# Patient Record
Sex: Female | Born: 1955
Health system: Southern US, Community
[De-identification: ages and names within clinical notes are randomized; demographics above are authoritative.]

## PROBLEM LIST (undated history)

## (undated) DIAGNOSIS — K7460 Unspecified cirrhosis of liver: Secondary | ICD-10-CM

## (undated) DIAGNOSIS — I864 Gastric varices: Secondary | ICD-10-CM

## (undated) DIAGNOSIS — J189 Pneumonia, unspecified organism: Secondary | ICD-10-CM

## (undated) DIAGNOSIS — I959 Hypotension, unspecified: Secondary | ICD-10-CM

## (undated) DIAGNOSIS — I10 Essential (primary) hypertension: Secondary | ICD-10-CM

## (undated) DIAGNOSIS — Z860100 Personal history of colon polyps, unspecified: Secondary | ICD-10-CM

## (undated) DIAGNOSIS — R011 Cardiac murmur, unspecified: Secondary | ICD-10-CM

## (undated) DIAGNOSIS — M2550 Pain in unspecified joint: Secondary | ICD-10-CM

## (undated) DIAGNOSIS — E785 Hyperlipidemia, unspecified: Secondary | ICD-10-CM

## (undated) DIAGNOSIS — E119 Type 2 diabetes mellitus without complications: Secondary | ICD-10-CM

## (undated) DIAGNOSIS — G4730 Sleep apnea, unspecified: Secondary | ICD-10-CM

## (undated) DIAGNOSIS — I509 Heart failure, unspecified: Secondary | ICD-10-CM

## (undated) DIAGNOSIS — Z9289 Personal history of other medical treatment: Secondary | ICD-10-CM

## (undated) DIAGNOSIS — R112 Nausea with vomiting, unspecified: Secondary | ICD-10-CM

## (undated) DIAGNOSIS — D649 Anemia, unspecified: Secondary | ICD-10-CM

## (undated) DIAGNOSIS — D696 Thrombocytopenia, unspecified: Secondary | ICD-10-CM

## (undated) DIAGNOSIS — M797 Fibromyalgia: Secondary | ICD-10-CM

## (undated) DIAGNOSIS — R161 Splenomegaly, not elsewhere classified: Secondary | ICD-10-CM

## (undated) DIAGNOSIS — K7581 Nonalcoholic steatohepatitis (NASH): Secondary | ICD-10-CM

## (undated) DIAGNOSIS — M7989 Other specified soft tissue disorders: Secondary | ICD-10-CM

## (undated) DIAGNOSIS — K7469 Other cirrhosis of liver: Secondary | ICD-10-CM

## (undated) DIAGNOSIS — Z9889 Other specified postprocedural states: Secondary | ICD-10-CM

## (undated) DIAGNOSIS — I4819 Other persistent atrial fibrillation: Secondary | ICD-10-CM

## (undated) DIAGNOSIS — K766 Portal hypertension: Secondary | ICD-10-CM

## (undated) DIAGNOSIS — I499 Cardiac arrhythmia, unspecified: Secondary | ICD-10-CM

## (undated) DIAGNOSIS — R0600 Dyspnea, unspecified: Secondary | ICD-10-CM

## (undated) DIAGNOSIS — M1990 Unspecified osteoarthritis, unspecified site: Secondary | ICD-10-CM

## (undated) HISTORY — DX: Type 2 diabetes mellitus without complications: E11.9

## (undated) HISTORY — PX: KNEE ARTHROSCOPY: SHX127

## (undated) HISTORY — DX: Personal history of colon polyps, unspecified: Z86.0100

## (undated) HISTORY — DX: Essential (primary) hypertension: I10

## (undated) HISTORY — DX: Fibromyalgia: M79.7

## (undated) HISTORY — PX: CHOLECYSTECTOMY: SHX55

## (undated) HISTORY — DX: Personal history of colonic polyps: Z86.010

## (undated) HISTORY — DX: Sleep apnea, unspecified: G47.30

## (undated) HISTORY — DX: Hyperlipidemia, unspecified: E78.5

## (undated) HISTORY — PX: COLONOSCOPY: SHX174

---

## 2001-10-14 ENCOUNTER — Ambulatory Visit (HOSPITAL_BASED_OUTPATIENT_CLINIC_OR_DEPARTMENT_OTHER): Admission: RE | Admit: 2001-10-14 | Discharge: 2001-10-14 | Payer: Self-pay | Admitting: Emergency Medicine

## 2002-01-30 ENCOUNTER — Ambulatory Visit (HOSPITAL_BASED_OUTPATIENT_CLINIC_OR_DEPARTMENT_OTHER): Admission: RE | Admit: 2002-01-30 | Discharge: 2002-01-30 | Payer: Self-pay | Admitting: Internal Medicine

## 2003-12-09 ENCOUNTER — Ambulatory Visit (HOSPITAL_COMMUNITY): Admission: RE | Admit: 2003-12-09 | Discharge: 2003-12-09 | Payer: Self-pay | Admitting: Orthopedic Surgery

## 2003-12-09 ENCOUNTER — Ambulatory Visit (HOSPITAL_BASED_OUTPATIENT_CLINIC_OR_DEPARTMENT_OTHER): Admission: RE | Admit: 2003-12-09 | Discharge: 2003-12-09 | Payer: Self-pay | Admitting: Orthopedic Surgery

## 2006-04-06 ENCOUNTER — Encounter: Admission: RE | Admit: 2006-04-06 | Discharge: 2006-04-06 | Payer: Self-pay | Admitting: Sports Medicine

## 2006-05-01 ENCOUNTER — Ambulatory Visit (HOSPITAL_BASED_OUTPATIENT_CLINIC_OR_DEPARTMENT_OTHER): Admission: RE | Admit: 2006-05-01 | Discharge: 2006-05-01 | Payer: Self-pay | Admitting: Orthopedic Surgery

## 2006-05-29 ENCOUNTER — Encounter: Admission: RE | Admit: 2006-05-29 | Discharge: 2006-08-27 | Payer: Self-pay | Admitting: Orthopedic Surgery

## 2007-06-12 ENCOUNTER — Ambulatory Visit (HOSPITAL_BASED_OUTPATIENT_CLINIC_OR_DEPARTMENT_OTHER): Admission: RE | Admit: 2007-06-12 | Discharge: 2007-06-12 | Payer: Self-pay | Admitting: Emergency Medicine

## 2007-06-17 ENCOUNTER — Ambulatory Visit: Payer: Self-pay | Admitting: Internal Medicine

## 2007-07-31 ENCOUNTER — Ambulatory Visit: Payer: Self-pay | Admitting: Internal Medicine

## 2007-08-07 DIAGNOSIS — J309 Allergic rhinitis, unspecified: Secondary | ICD-10-CM | POA: Insufficient documentation

## 2007-08-07 DIAGNOSIS — G4733 Obstructive sleep apnea (adult) (pediatric): Secondary | ICD-10-CM | POA: Insufficient documentation

## 2007-08-07 DIAGNOSIS — I10 Essential (primary) hypertension: Secondary | ICD-10-CM | POA: Insufficient documentation

## 2007-11-08 ENCOUNTER — Encounter: Admission: RE | Admit: 2007-11-08 | Discharge: 2007-11-08 | Payer: Self-pay | Admitting: Family Medicine

## 2007-12-12 ENCOUNTER — Telehealth: Payer: Self-pay | Admitting: Internal Medicine

## 2007-12-25 ENCOUNTER — Ambulatory Visit: Payer: Self-pay | Admitting: Internal Medicine

## 2008-03-06 ENCOUNTER — Other Ambulatory Visit: Admission: RE | Admit: 2008-03-06 | Discharge: 2008-03-06 | Payer: Self-pay | Admitting: Obstetrics and Gynecology

## 2008-12-01 ENCOUNTER — Ambulatory Visit: Payer: Self-pay | Admitting: Oncology

## 2008-12-10 ENCOUNTER — Encounter: Payer: Self-pay | Admitting: Internal Medicine

## 2008-12-10 LAB — CBC WITH DIFFERENTIAL/PLATELET
BASO%: 0.5 % (ref 0.0–2.0)
Eosinophils Absolute: 0.1 10*3/uL (ref 0.0–0.5)
HCT: 39.2 % (ref 34.8–46.6)
LYMPH%: 31.2 % (ref 14.0–49.7)
MCHC: 34.8 g/dL (ref 31.5–36.0)
MCV: 86.6 fL (ref 79.5–101.0)
MONO#: 0.3 10*3/uL (ref 0.1–0.9)
MONO%: 6.1 % (ref 0.0–14.0)
NEUT%: 61.2 % (ref 38.4–76.8)
Platelets: 101 10*3/uL — ABNORMAL LOW (ref 145–400)
RBC: 4.52 10*6/uL (ref 3.70–5.45)
WBC: 5.5 10*3/uL (ref 3.9–10.3)

## 2008-12-11 LAB — COMPREHENSIVE METABOLIC PANEL
BUN: 20 mg/dL (ref 6–23)
CO2: 23 mEq/L (ref 19–32)
Calcium: 9.5 mg/dL (ref 8.4–10.5)
Chloride: 109 mEq/L (ref 96–112)
Creatinine, Ser: 0.8 mg/dL (ref 0.40–1.20)
Total Bilirubin: 0.5 mg/dL (ref 0.3–1.2)

## 2008-12-11 LAB — ANA: Anti Nuclear Antibody(ANA): NEGATIVE

## 2008-12-11 LAB — LACTATE DEHYDROGENASE: LDH: 149 U/L (ref 94–250)

## 2008-12-11 LAB — RHEUMATOID FACTOR: Rhuematoid fact SerPl-aCnc: 410 IU/mL — ABNORMAL HIGH (ref 0–20)

## 2008-12-23 ENCOUNTER — Ambulatory Visit: Payer: Self-pay | Admitting: Internal Medicine

## 2008-12-23 DIAGNOSIS — D6949 Other primary thrombocytopenia: Secondary | ICD-10-CM

## 2009-01-15 ENCOUNTER — Encounter: Payer: Self-pay | Admitting: Internal Medicine

## 2009-01-29 ENCOUNTER — Ambulatory Visit (HOSPITAL_BASED_OUTPATIENT_CLINIC_OR_DEPARTMENT_OTHER): Admission: RE | Admit: 2009-01-29 | Discharge: 2009-01-29 | Payer: Self-pay | Admitting: Orthopedic Surgery

## 2009-03-18 ENCOUNTER — Encounter: Admission: RE | Admit: 2009-03-18 | Discharge: 2009-03-18 | Payer: Self-pay | Admitting: Orthopedic Surgery

## 2009-03-19 ENCOUNTER — Encounter: Admission: RE | Admit: 2009-03-19 | Discharge: 2009-04-09 | Payer: Self-pay | Admitting: Orthopedic Surgery

## 2009-04-10 ENCOUNTER — Ambulatory Visit: Payer: Self-pay | Admitting: Oncology

## 2009-04-15 ENCOUNTER — Encounter: Payer: Self-pay | Admitting: Internal Medicine

## 2009-04-15 LAB — CBC WITH DIFFERENTIAL/PLATELET
BASO%: 0.5 % (ref 0.0–2.0)
EOS%: 1.5 % (ref 0.0–7.0)
Eosinophils Absolute: 0.1 10*3/uL (ref 0.0–0.5)
LYMPH%: 26.8 % (ref 14.0–49.7)
MCH: 30.4 pg (ref 25.1–34.0)
MCHC: 35 g/dL (ref 31.5–36.0)
MCV: 86.9 fL (ref 79.5–101.0)
MONO%: 9.5 % (ref 0.0–14.0)
Platelets: 96 10*3/uL — ABNORMAL LOW (ref 145–400)
RBC: 4.37 10*6/uL (ref 3.70–5.45)

## 2009-07-28 ENCOUNTER — Ambulatory Visit: Payer: Self-pay | Admitting: Oncology

## 2009-07-30 ENCOUNTER — Encounter: Payer: Self-pay | Admitting: Internal Medicine

## 2009-07-30 LAB — CBC WITH DIFFERENTIAL/PLATELET
BASO%: 0.3 % (ref 0.0–2.0)
Basophils Absolute: 0 10*3/uL (ref 0.0–0.1)
Eosinophils Absolute: 0.1 10*3/uL (ref 0.0–0.5)
HCT: 38.2 % (ref 34.8–46.6)
LYMPH%: 23.9 % (ref 14.0–49.7)
MCHC: 34 g/dL (ref 31.5–36.0)
MONO#: 0.4 10*3/uL (ref 0.1–0.9)
NEUT%: 66.9 % (ref 38.4–76.8)
Platelets: 103 10*3/uL — ABNORMAL LOW (ref 145–400)
WBC: 4.8 10*3/uL (ref 3.9–10.3)

## 2009-07-30 LAB — COMPREHENSIVE METABOLIC PANEL
ALT: 13 U/L (ref 0–35)
Albumin: 4.2 g/dL (ref 3.5–5.2)
BUN: 20 mg/dL (ref 6–23)
CO2: 25 mEq/L (ref 19–32)
Calcium: 8.9 mg/dL (ref 8.4–10.5)
Creatinine, Ser: 0.83 mg/dL (ref 0.40–1.20)
Glucose, Bld: 121 mg/dL — ABNORMAL HIGH (ref 70–99)
Total Bilirubin: 0.6 mg/dL (ref 0.3–1.2)
Total Protein: 6.9 g/dL (ref 6.0–8.3)

## 2009-09-19 HISTORY — PX: COLECTOMY: SHX59

## 2009-10-23 ENCOUNTER — Ambulatory Visit (HOSPITAL_COMMUNITY): Admission: RE | Admit: 2009-10-23 | Discharge: 2009-10-23 | Payer: Self-pay | Admitting: Gastroenterology

## 2009-12-30 ENCOUNTER — Ambulatory Visit: Payer: Self-pay | Admitting: Oncology

## 2010-01-01 ENCOUNTER — Encounter: Payer: Self-pay | Admitting: Internal Medicine

## 2010-01-01 LAB — CBC WITH DIFFERENTIAL/PLATELET
BASO%: 0.5 % (ref 0.0–2.0)
Basophils Absolute: 0 10*3/uL (ref 0.0–0.1)
EOS%: 1.4 % (ref 0.0–7.0)
Eosinophils Absolute: 0.1 10*3/uL (ref 0.0–0.5)
HCT: 37.7 % (ref 34.8–46.6)
MCHC: 34.6 g/dL (ref 31.5–36.0)
MONO%: 7 % (ref 0.0–14.0)
NEUT%: 63.6 % (ref 38.4–76.8)
lymph#: 1.4 10*3/uL (ref 0.9–3.3)

## 2010-01-01 LAB — COMPREHENSIVE METABOLIC PANEL
Alkaline Phosphatase: 56 U/L (ref 39–117)
BUN: 21 mg/dL (ref 6–23)
Calcium: 8.9 mg/dL (ref 8.4–10.5)
Chloride: 108 mEq/L (ref 96–112)
Creatinine, Ser: 0.77 mg/dL (ref 0.40–1.20)
Potassium: 3.9 mEq/L (ref 3.5–5.3)
Sodium: 140 mEq/L (ref 135–145)
Total Bilirubin: 0.6 mg/dL (ref 0.3–1.2)

## 2010-02-01 ENCOUNTER — Encounter (INDEPENDENT_AMBULATORY_CARE_PROVIDER_SITE_OTHER): Payer: Self-pay | Admitting: General Surgery

## 2010-02-01 ENCOUNTER — Inpatient Hospital Stay (HOSPITAL_COMMUNITY): Admission: RE | Admit: 2010-02-01 | Discharge: 2010-02-05 | Payer: Self-pay | Admitting: General Surgery

## 2010-02-09 ENCOUNTER — Ambulatory Visit: Payer: Self-pay | Admitting: Oncology

## 2010-02-10 LAB — COMPREHENSIVE METABOLIC PANEL
AST: 15 U/L (ref 0–37)
Alkaline Phosphatase: 55 U/L (ref 39–117)
BUN: 13 mg/dL (ref 6–23)
Chloride: 103 mEq/L (ref 96–112)
Creatinine, Ser: 0.78 mg/dL (ref 0.40–1.20)
Glucose, Bld: 114 mg/dL — ABNORMAL HIGH (ref 70–99)
Potassium: 4.5 mEq/L (ref 3.5–5.3)
Sodium: 140 mEq/L (ref 135–145)
Total Bilirubin: 0.7 mg/dL (ref 0.3–1.2)
Total Protein: 6.8 g/dL (ref 6.0–8.3)

## 2010-02-10 LAB — CBC WITH DIFFERENTIAL/PLATELET
BASO%: 0.4 % (ref 0.0–2.0)
EOS%: 1.6 % (ref 0.0–7.0)
Eosinophils Absolute: 0.1 10*3/uL (ref 0.0–0.5)
HGB: 11.6 g/dL (ref 11.6–15.9)
LYMPH%: 16.2 % (ref 14.0–49.7)
MONO#: 0.4 10*3/uL (ref 0.1–0.9)
NEUT%: 75.8 % (ref 38.4–76.8)
Platelets: 134 10*3/uL — ABNORMAL LOW (ref 145–400)
WBC: 6.3 10*3/uL (ref 3.9–10.3)

## 2010-03-10 ENCOUNTER — Encounter: Admission: RE | Admit: 2010-03-10 | Discharge: 2010-03-10 | Payer: Self-pay | Admitting: General Surgery

## 2010-03-15 ENCOUNTER — Ambulatory Visit (HOSPITAL_COMMUNITY): Admission: RE | Admit: 2010-03-15 | Discharge: 2010-03-16 | Payer: Self-pay | Admitting: General Surgery

## 2010-07-21 ENCOUNTER — Ambulatory Visit (HOSPITAL_BASED_OUTPATIENT_CLINIC_OR_DEPARTMENT_OTHER): Payer: BC Managed Care – PPO | Admitting: Oncology

## 2010-07-23 LAB — CBC WITH DIFFERENTIAL/PLATELET
Basophils Absolute: 0 10*3/uL (ref 0.0–0.1)
EOS%: 2 % (ref 0.0–7.0)
HGB: 13.3 g/dL (ref 11.6–15.9)
LYMPH%: 21.5 % (ref 14.0–49.7)
MONO#: 0.4 10*3/uL (ref 0.1–0.9)
MONO%: 7.4 % (ref 0.0–14.0)
NEUT#: 3.7 10*3/uL (ref 1.5–6.5)
NEUT%: 68.6 % (ref 38.4–76.8)
RBC: 4.45 10*6/uL (ref 3.70–5.45)

## 2010-07-23 LAB — COMPREHENSIVE METABOLIC PANEL
ALT: 40 U/L — ABNORMAL HIGH (ref 0–35)
AST: 44 U/L — ABNORMAL HIGH (ref 0–37)
Albumin: 4.4 g/dL (ref 3.5–5.2)
BUN: 14 mg/dL (ref 6–23)
Calcium: 9.1 mg/dL (ref 8.4–10.5)
Chloride: 106 mEq/L (ref 96–112)
Glucose, Bld: 96 mg/dL (ref 70–99)
Sodium: 139 mEq/L (ref 135–145)
Total Protein: 7 g/dL (ref 6.0–8.3)

## 2010-10-19 NOTE — Letter (Signed)
Summary: [facility]   Imported By: Phillis Knack 01/25/2010 10:40:21  _____________________________________________________________________  External Attachment:    Type:   Image     Comment:   External Document

## 2010-10-21 ENCOUNTER — Ambulatory Visit: Payer: BC Managed Care – PPO | Admitting: Oncology

## 2010-10-21 DIAGNOSIS — Z86010 Personal history of colonic polyps: Secondary | ICD-10-CM

## 2010-10-21 DIAGNOSIS — D696 Thrombocytopenia, unspecified: Secondary | ICD-10-CM

## 2010-10-21 LAB — CBC WITH DIFFERENTIAL/PLATELET
BASO%: 0.5 % (ref 0.0–2.0)
EOS%: 1.6 % (ref 0.0–7.0)
HGB: 13.5 g/dL (ref 11.6–15.9)
MCH: 29.6 pg (ref 25.1–34.0)
MCV: 88.6 fL (ref 79.5–101.0)
MONO%: 6 % (ref 0.0–14.0)
NEUT#: 3.7 10*3/uL (ref 1.5–6.5)
Platelets: 72 10*3/uL — ABNORMAL LOW (ref 145–400)
WBC: 5.7 10*3/uL (ref 3.9–10.3)
nRBC: 0 % (ref 0–0)

## 2010-12-05 LAB — CBC
Hemoglobin: 12.3 g/dL (ref 12.0–15.0)
MCHC: 35 g/dL (ref 30.0–36.0)
Platelets: 96 10*3/uL — ABNORMAL LOW (ref 150–400)

## 2010-12-05 LAB — DIFFERENTIAL
Basophils Absolute: 0 10*3/uL (ref 0.0–0.1)
Basophils Relative: 0 % (ref 0–1)
Eosinophils Absolute: 0.1 10*3/uL (ref 0.0–0.7)
Lymphocytes Relative: 19 % (ref 12–46)
Lymphs Abs: 1.1 10*3/uL (ref 0.7–4.0)
Monocytes Relative: 8 % (ref 3–12)
Neutro Abs: 4.1 10*3/uL (ref 1.7–7.7)
Neutrophils Relative %: 72 % (ref 43–77)

## 2010-12-05 LAB — BASIC METABOLIC PANEL
BUN: 11 mg/dL (ref 6–23)
Creatinine, Ser: 0.69 mg/dL (ref 0.4–1.2)
GFR calc non Af Amer: >60 mL/min (ref >60)

## 2010-12-06 LAB — GLUCOSE, CAPILLARY
Glucose-Capillary: 102 mg/dL — ABNORMAL HIGH (ref 70–99)
Glucose-Capillary: 103 mg/dL — ABNORMAL HIGH (ref 70–99)
Glucose-Capillary: 107 mg/dL — ABNORMAL HIGH (ref 70–99)
Glucose-Capillary: 112 mg/dL — ABNORMAL HIGH (ref 70–99)
Glucose-Capillary: 113 mg/dL — ABNORMAL HIGH (ref 70–99)
Glucose-Capillary: 114 mg/dL — ABNORMAL HIGH (ref 70–99)
Glucose-Capillary: 122 mg/dL — ABNORMAL HIGH (ref 70–99)
Glucose-Capillary: 126 mg/dL — ABNORMAL HIGH (ref 70–99)
Glucose-Capillary: 127 mg/dL — ABNORMAL HIGH (ref 70–99)
Glucose-Capillary: 130 mg/dL — ABNORMAL HIGH (ref 70–99)
Glucose-Capillary: 170 mg/dL — ABNORMAL HIGH (ref 70–99)
Glucose-Capillary: 90 mg/dL (ref 70–99)
Glucose-Capillary: 93 mg/dL (ref 70–99)

## 2010-12-06 LAB — BASIC METABOLIC PANEL
CO2: 24 mEq/L (ref 19–32)
Chloride: 110 mEq/L (ref 96–112)
Chloride: 116 mEq/L — ABNORMAL HIGH (ref 96–112)
GFR calc Af Amer: >60 mL/min (ref >60)
GFR calc Af Amer: >60 mL/min (ref >60)
Potassium: 3.8 mEq/L (ref 3.5–5.1)
Potassium: 4.4 mEq/L (ref 3.5–5.1)
Sodium: 139 mEq/L (ref 135–145)
Sodium: 141 mEq/L (ref 135–145)

## 2010-12-06 LAB — CBC
HCT: 30.8 % — ABNORMAL LOW (ref 36.0–46.0)
HCT: 31.4 % — ABNORMAL LOW (ref 36.0–46.0)
Hemoglobin: 10.4 g/dL — ABNORMAL LOW (ref 12.0–15.0)
Hemoglobin: 10.7 g/dL — ABNORMAL LOW (ref 12.0–15.0)
MCHC: 33.6 g/dL (ref 30.0–36.0)
MCHC: 33.9 g/dL (ref 30.0–36.0)
MCV: 89.3 fL (ref 78.0–100.0)
Platelets: 102 10*3/uL — ABNORMAL LOW (ref 150–400)
Platelets: 74 10*3/uL — ABNORMAL LOW (ref 150–400)
RBC: 3.45 MIL/uL — ABNORMAL LOW (ref 3.87–5.11)
RDW: 14.2 % (ref 11.5–15.5)
WBC: 5.3 10*3/uL (ref 4.0–10.5)
WBC: 7.5 10*3/uL (ref 4.0–10.5)
WBC: 9.5 10*3/uL (ref 4.0–10.5)

## 2010-12-07 LAB — CBC
Platelets: 96 10*3/uL — ABNORMAL LOW (ref 150–400)
RDW: 14.1 % (ref 11.5–15.5)
WBC: 5.6 10*3/uL (ref 4.0–10.5)

## 2010-12-07 LAB — COMPREHENSIVE METABOLIC PANEL
ALT: 18 U/L (ref 0–35)
AST: 16 U/L (ref 0–37)
Albumin: 3.7 g/dL (ref 3.5–5.2)
Alkaline Phosphatase: 63 U/L (ref 39–117)
Chloride: 109 mEq/L (ref 96–112)
GFR calc Af Amer: >60 mL/min (ref >60)
Potassium: 4.2 mEq/L (ref 3.5–5.1)
Sodium: 141 mEq/L (ref 135–145)
Total Bilirubin: 0.7 mg/dL (ref 0.3–1.2)
Total Protein: 7.2 g/dL (ref 6.0–8.3)

## 2010-12-07 LAB — DIFFERENTIAL
Basophils Absolute: 0 10*3/uL (ref 0.0–0.1)
Basophils Relative: 1 % (ref 0–1)
Eosinophils Absolute: 0.1 10*3/uL (ref 0.0–0.7)
Eosinophils Relative: 1 % (ref 0–5)
Monocytes Absolute: 0.4 10*3/uL (ref 0.1–1.0)
Monocytes Relative: 7 % (ref 3–12)
Neutro Abs: 3.8 10*3/uL (ref 1.7–7.7)

## 2010-12-07 LAB — APTT: aPTT: 33 seconds (ref 24–37)

## 2011-01-20 ENCOUNTER — Encounter (HOSPITAL_BASED_OUTPATIENT_CLINIC_OR_DEPARTMENT_OTHER): Payer: BC Managed Care – PPO | Admitting: Oncology

## 2011-01-20 ENCOUNTER — Other Ambulatory Visit: Payer: Self-pay | Admitting: Medical

## 2011-01-20 DIAGNOSIS — D6949 Other primary thrombocytopenia: Secondary | ICD-10-CM

## 2011-01-20 DIAGNOSIS — D696 Thrombocytopenia, unspecified: Secondary | ICD-10-CM

## 2011-01-20 DIAGNOSIS — Z86010 Personal history of colonic polyps: Secondary | ICD-10-CM

## 2011-01-20 LAB — COMPREHENSIVE METABOLIC PANEL
Albumin: 4.2 g/dL (ref 3.5–5.2)
Alkaline Phosphatase: 73 U/L (ref 39–117)
BUN: 13 mg/dL (ref 6–23)
Calcium: 9.2 mg/dL (ref 8.4–10.5)
Chloride: 106 mEq/L (ref 96–112)
Creatinine, Ser: 0.71 mg/dL (ref 0.40–1.20)
Glucose, Bld: 116 mg/dL — ABNORMAL HIGH (ref 70–99)
Potassium: 4.2 mEq/L (ref 3.5–5.3)

## 2011-01-20 LAB — CBC WITH DIFFERENTIAL/PLATELET
BASO%: 0.4 % (ref 0.0–2.0)
Basophils Absolute: 0 10*3/uL (ref 0.0–0.1)
EOS%: 1.8 % (ref 0.0–7.0)
Eosinophils Absolute: 0.1 10*3/uL (ref 0.0–0.5)
HCT: 36.9 % (ref 34.8–46.6)
HGB: 12.7 g/dL (ref 11.6–15.9)
LYMPH%: 22.7 % (ref 14.0–49.7)
MCH: 30.7 pg (ref 25.1–34.0)
MCHC: 34.3 g/dL (ref 31.5–36.0)
MCV: 89.4 fL (ref 79.5–101.0)
MONO#: 0.4 10*3/uL (ref 0.1–0.9)
MONO%: 8.2 % (ref 0.0–14.0)
NEUT#: 3.1 10*3/uL (ref 1.5–6.5)
NEUT%: 66.9 % (ref 38.4–76.8)
Platelets: 78 10*3/uL — ABNORMAL LOW (ref 145–400)
RBC: 4.13 10*6/uL (ref 3.70–5.45)
RDW: 14.8 % — ABNORMAL HIGH (ref 11.2–14.5)
WBC: 4.6 10*3/uL (ref 3.9–10.3)
lymph#: 1 10*3/uL (ref 0.9–3.3)

## 2011-02-01 NOTE — Op Note (Signed)
NAMEALAIJAH, Hannah Khan               ACCOUNT NO.:  [id number]   MEDICAL RECORD NO.:  [id number]          PATIENT TYPE:  AMB   LOCATION:  [place]                          FACILITY:  [facility]   PHYSICIAN:  Robert A. Noemi Khan, M.D. DATE OF BIRTH:  1956-04-08   DATE OF PROCEDURE:  01/29/2009  DATE OF DISCHARGE:                               OPERATIVE REPORT   PREOPERATIVE DIAGNOSES:  1. Right knee medial and lateral meniscal tears with chondromalacia      and synovitis.  2. Right knee loose body.   POSTOPERATIVE DIAGNOSES:  1. Right knee medial and lateral meniscal tears with chondromalacia      and synovitis.  2. Right knee loose body.   PROCEDURE:  1. Right knee examination under anesthesia, followed by arthroscopic      partial medial and lateral meniscectomies.  2. Right knee loose body excision.  3. Right knee chondroplasty with partial synovectomy.   SURGEON:  Hannah Camel. Noemi Chapel, MD   ANESTHESIA:  Local and MAC.   OPERATIVE TIME:  40 minutes.   COMPLICATIONS:  None.   INDICATIONS FOR PROCEDURE:  Ms. Hannah Khan is a 52-year woman who has had 3-  4 months of increasing right knee pain with exam and MRI documenting  meniscal tearing with chondromalacia, possible loose body, and  synovitis.  She has failed conservative care and is now to undergo  arthroscopy.   DESCRIPTION:  Ms. Hannah Khan is brought to the operating room on Jan 29, 2009, after a knee block was placed in holding room by Anesthesia.  She  was placed on the operative table in supine position.  Her right knee  was examined under anesthesia.  She had full range of motion.  Her knee  was stable, ligamentous exam with normal patellar tracking.  She  received Ancef 1 g IV preoperatively for prophylaxis.  Her right leg was  then prepped using sterile DuraPrep and draped using sterile technique.  Originally through an anterolateral portal, the arthroscope with a pump  attached was placed through an anteromedial portal and an  arthroscopic  probe was placed.  On initial inspection of medial compartment, she was  found to have 50-60% grade 3 chondromalacia which was debrided.  Medial  meniscus showed tear of the anterior horn and medial horn of which 25%  was resected back to a stable rim.  Intercondylar notch showed a large  loose body in the notch measuring 1.5 x 2 cm which was removed.  The ACL  and PCL were normal.  Lateral compartment showed grade 1 and 2  chondromalacia and 20% grade 3 changes which was debrided.  Lateral  meniscus showed tearing of the anterior and lateral horn 25-30% which  was resected back to a stable rim.  The rest of lateral meniscus was  intact.  Patellofemoral joint showed 75% grade 3 chondromalacia on the  patella and femoral groove and this was debrided.  The patella tracked  normally.  There was significant scarred synovitis in the medial and  lateral gutters which was debrided, otherwise, they are free of  pathology.  After this was  done, it felt that all pathology have been  satisfactorily addressed.  The instruments were removed.  The portals  were closed with 3-0 nylon suture.  Sterile dressings applied and the  patient awakened and taken to recovery in stable condition.   FOLLOWUP CARE:  Ms. Hannah Khan will be followed as an outpatient on Vicodin  and Mobic.  She will be seen back in the office in a week for sutures  out and followup.      Robert A. Noemi Khan, M.D.  Electronically Signed     Hannah Khan, M.D.  Electronically Signed    RAW/MEDQ  D:  01/29/2009  T:  01/30/2009  Job:  [id number]

## 2011-02-01 NOTE — Assessment & Plan Note (Signed)
[facility]                             PULMONARY OFFICE NOTE   NAME:JOHNSONSharaya, Hannah Khan                      MRN:          [id number]  DATE:07/31/2007                            DOB:          March 10, 1956    PROBLEM:  A 55 year old woman, previously followed at the old office,  coming now to establish as a new patient because of sleep apnea.   HISTORY:  In 2003, she was diagnosed with obstructive sleep apnea with  an apnea/hypopnea index of 45 per hour, moderate snoring, and  desaturation to 82%.  CPAP was titrated and then adjusted to 7 CWP which  she has been using ever since.  A new sleep study was done for update 2  months ago, and her CPAP pressure was adjusted up to 14.  She thinks she  is sleeping better now, but the pressure seems high.  When seen in 2003,  she had weighed 325 pounds, and she has gained weight since then.  She  had a recent sinusitis and head congestion which is beginning to  improve, but she is experiencing dry mouth.  Bedtime is around 11:30,  estimating sleep latency 10 minutes, waking 3 to 4 times during the  night before finally up at 6 a.m.  Home care is through [facility].   HOME MEDICATIONS:  1. Lisinopril/hydrochlorothiazide 10/12.5.  2. Robaxin 500 mg b.i.d.  3. Topamax 200 mg.  4. Metformin 500 mg.  5. Pataday eye drops.  6. Xyzal 5 mg daily.  7. Recent Levaquin for sinus infection.   Drug intolerance to CELEBREX and PREDNISONE.   REVIEW OF SYSTEMS:  Feels bloated, aware of snoring, legs crawl some  before she goes to sleep, daytime sleepiness, body jerks during sleep,  night cough, occasional vivid dreams.   PAST HISTORY:  1. Hypertension.  2. Borderline diabetes.  3. Elevated cholesterol.  4. Allergic rhinitis.  5. Sleep apnea.  6. No history of seasonal rhinitis.   She has not had any ENT surgery.  Surgeries have included gallbladder,  bilateral knee surgeries, C-section.   SOCIAL HISTORY:  Quit smoking 30  years ago.  Remarried.   FAMILY HISTORY:  Nobody known to have sleep apnea.   OBJECTIVE:  Weight 332 pounds, BP 102/58, pulse 95, room air saturation  98%.  She is overweight, alert.  Palate spacing 3 to 4/4 with clear speech.  Somewhat stuffy nose during conversation with minimal turbinate edema,  no visible postnasal drip.  LUNGS:  Clear.  Heart sounds regular without murmur.  There is no evident restlessness or tremor.   IMPRESSION:  1. Obstructive sleep apnea.  I need to see the report from her      September study, but she does feel that she needed a pressure      increase from her original 7 CWP.  I am not sure if the current      pressure is a little too high, especially if it contributing to her      nasal congestion and dryness.  She is using a cool mist humidifier.  I am not sure that can be adjusted.  2. Resolving sinusitis.  3. Exogenous obesity.   PLAN:  1. Nasal nebulizer treatment, Neo-Synephrine.  2. She will ask [facility] about her humidifier.  3. Nasal saline gel.  4. Finish Levaquin.  5. Schedule return 1 month, earlier p.r.n.  6. I gave information on saline nasal lavage.     Clinton D. Annamaria Boots, MD, [facility], [place]  Electronically Signed    CDY/MedQ  DD: 07/31/2007  DT: 08/01/2007  Job #: [id number]   cc:   Zella Richer. Burnett Harry, M.D.

## 2011-02-03 ENCOUNTER — Encounter: Payer: Self-pay | Admitting: Internal Medicine

## 2011-02-03 ENCOUNTER — Ambulatory Visit (INDEPENDENT_AMBULATORY_CARE_PROVIDER_SITE_OTHER): Payer: BC Managed Care – PPO | Admitting: Internal Medicine

## 2011-02-03 VITALS — BP 144/82 | HR 124 | Ht 67.0 in | Wt 338.0 lb

## 2011-02-03 DIAGNOSIS — J309 Allergic rhinitis, unspecified: Secondary | ICD-10-CM

## 2011-02-03 DIAGNOSIS — G4733 Obstructive sleep apnea (adult) (pediatric): Secondary | ICD-10-CM

## 2011-02-03 NOTE — Progress Notes (Signed)
  Subjective:    Patient ID: Hannah Khan, female    DOB: [birth year], 55 y.o.   MRN: [id number]  HPI 02/03/11- 60 yoF former smoker, followed for OSA, complicated by thrombocytopenia, HPB , allergic rhintis, DM.  Last here 12/24/07. Last year she had an ascending colectomy for polyp with abscess complication. She lost then gained weight with the surgery. She realizes with weight gain since that  she is snoring through her CPAP, sleep is less restful, fights some daytime sleepiness. CPAP pressure is at 7 through [facility] with machine that is now quite old. Spring pollen was notable but not really bad with stuffiness but no itching of eyes.    Review of Systems Constitutional:   No weight loss, night sweats,  Fevers, chills, fatigue, lassitude. HEENT:   No headaches,  Difficulty swallowing,  Tooth/dental problems,  Sore throat,                No sneezing, itching, ear ache, nasal congestion, post nasal drip,   CV:  No chest pain,  Orthopnea, PND, swelling in lower extremities, anasarca, dizziness, palpitations  GI  No heartburn, indigestion, abdominal pain, nausea, vomiting, diarrhea, change in bowel habits, loss of appetite  Resp: No shortness of breath with exertion or at rest.  No excess mucus, no productive cough,  No non-productive cough,  No coughing up of blood.  No change in color of mucus.  No wheezing.   Skin: no rash or lesions.  GU: no dysuria, change in color of urine, no urgency or frequency.  No flank pain.  MS:  No joint pain or swelling.  No decreased range of motion.  No back pain.  Psych:  No change in mood or affect. No depression or anxiety.  No memory loss.      Objective:   Physical Exam General- Alert, Oriented, Affect-appropriate, Distress- none acute   Very obese Skin- rash-none, lesions- none, excoriation- none  Lymphadenopathy- none  Head- atraumatic  Eyes- Gross vision intact, PERRLA, conjunctivae clear secretions  Ears- Hearing, canals, Tm -  normal  Nose- Clear,  No- Septal dev, mucus, polyps, erosion, perforation   Throat- Mallampati II , mucosa clear , drainage- none, tonsils- atrophic  Neck- flexible , trachea midline, no stridor , thyroid nl, carotid no bruit  Chest - symmetrical excursion , unlabored     Heart/CV- RRR , no murmur , no gallop  , no rub, nl s1 s2                     - JVD- none , edema- none, stasis changes- none, varices- none     Lung- clear to P&A, wheeze- none, cough- none , dullness-none, rub- none     Chest wall-  Abd- tender-no, distended-no, bowel sounds-present, HSM- none;       borborygmi  Br/ Gen/ Rectal- Not done, not indicated  Extrem- cyanosis- none, clubbing, none, atrophy- none, strength- nl  Neuro- grossly intact to observation         Assessment & Plan:

## 2011-02-03 NOTE — Assessment & Plan Note (Signed)
Mild seasonal rhinitis this year.

## 2011-02-03 NOTE — Patient Instructions (Signed)
Orders- [place]- 1) [facility]- autotitrate CPAP 5-15 cwp x 1 week for pressure recommendation                         2) Replacement CPAP machine - pressure to be determined after we get autotitration download

## 2011-02-03 NOTE — Assessment & Plan Note (Addendum)
Machine is old and by her description, pressure is insufficient. We will autotitrate and replace machine.  Weight loss would help.

## 2011-02-04 NOTE — Procedures (Signed)
NAME:  Hannah Khan, Hannah Khan               ACCOUNT NO.:  [id number]   MEDICAL RECORD NO.:  [id number]          PATIENT TYPE:  OUT   LOCATION:  [facility]                 FACILITY:  [facility]   PHYSICIAN:  Clinton D. Annamaria Boots, MD, FCCP, FACPDATE OF BIRTH:  04[birth year]   DATE OF STUDY:                            NOCTURNAL POLYSOMNOGRAM   REFERRING PHYSICIAN:  Zella Richer. Burnett Harry, M.D.   INDICATION FOR STUDY:  Hypersomnia with sleep apnea.   EPWORTH SLEEPINESS SCORE:  Ten/twenty-four, BMI 49, weight 325 pounds.   MEDICATIONS:  Home medications are listed and reviewed. A previous study  on 10/14/01, recorded an AHI of 45/hour. CPAP titration is now requested.   SLEEP ARCHITECTURE:  Total sleep time 319 minutes with sleep efficiency  81%. Stage 1 was 3%, stage 2 56%, stage 3 was 10%, REM 11% of total  sleep time. Sleep latency 29 minutes, REM latency 68 minutes. Awake  after sleep onset 45 minutes, arousal index 15.6. Topamax and  methocarbamol taken at 9:50 p.m.   RESPIRATORY DATA:  CPAP titration protocol. CPAP was titrated to 14 CWP,  AHI 0/hour. She chose a Respironics Comfort Gel nasal mask with heated  humidifier.   OXYGEN DATA:  Snoring prevented by CPAP with oxygen saturation held at  97% on room air.   CARDIAC DATA:  Sinus rhythm with occasional PA-C.   MOVEMENT-PARASOMNIA:  A total of 53 limb jerks were recorded of which 23  were associated with arousal or awakening for a periodic limb movement  with arousal index of 10 per hour which is increased.   IMPRESSIONS-RECOMMENDATIONS:  1. Successful continuous positive airway pressure titration to 14      centimeters of water pressure, apnea/hypopnea index 0 per hour. A      petite, Respironics Comfort Gel nasal mask was used with heated      humidifier.  2. Previous sleep study on 10/14/01, had recorded an apnea/hypopnea      index of 45 per hour.  3. Periodic limb movement with arousal, 10 per hour.     Clinton D. Annamaria Boots, MD, [facility], FACP  Diplomate, Tax adviser of Sleep Medicine  Electronically Signed    CDY/MEDQ  D:  06/17/2007 14:02:24  T:  06/17/2007 14:30:55  Job:  [id number]

## 2011-03-11 ENCOUNTER — Telehealth: Payer: Self-pay | Admitting: Internal Medicine

## 2011-03-11 DIAGNOSIS — G4733 Obstructive sleep apnea (adult) (pediatric): Secondary | ICD-10-CM

## 2011-03-11 NOTE — Telephone Encounter (Signed)
Spoke with patient-[facility] states they faxed results of titration to [facility] on 6-19 and she is waiting for results as she is having trouble sleeping with current pressure. No results seen on my desk. Pt aware [facility] is out of office today and will review asap.

## 2011-03-15 ENCOUNTER — Other Ambulatory Visit (HOSPITAL_COMMUNITY)
Admission: RE | Admit: 2011-03-15 | Discharge: 2011-03-15 | Disposition: A | Discharge status: Home / Self Care | Payer: BC Managed Care – PPO | Source: Ambulatory Visit | Attending: Obstetrics and Gynecology | Admitting: Obstetrics and Gynecology

## 2011-03-15 ENCOUNTER — Other Ambulatory Visit: Payer: Self-pay | Admitting: Nurse Practitioner

## 2011-03-15 DIAGNOSIS — Z124 Encounter for screening for malignant neoplasm of cervix: Secondary | ICD-10-CM | POA: Insufficient documentation

## 2011-03-15 DIAGNOSIS — R87810 Cervical high risk human papillomavirus (HPV) DNA test positive: Secondary | ICD-10-CM | POA: Insufficient documentation

## 2011-03-15 NOTE — Telephone Encounter (Signed)
Pt calling again about her titration results.Hannah Khan

## 2011-03-17 NOTE — Telephone Encounter (Signed)
Pt called back- says she needs an order to change cpap pressure TODAY. She has not slept well for some time now and [facility] has been waiting for the order. Mariann Laster

## 2011-03-17 NOTE — Telephone Encounter (Signed)
Order- [facility]- [facility]  Set her CPAP at 15 cwp based on titration ending 03/06/11

## 2011-03-17 NOTE — Telephone Encounter (Signed)
Pt aware of change in CPAP pressure per [facility] and understands that Huey Romans will be contacted to make this change. Order sent to [facility].

## 2011-03-28 ENCOUNTER — Encounter: Payer: Self-pay | Admitting: Internal Medicine

## 2011-06-16 ENCOUNTER — Telehealth: Payer: Self-pay | Admitting: Internal Medicine

## 2011-06-16 DIAGNOSIS — G4733 Obstructive sleep apnea (adult) (pediatric): Secondary | ICD-10-CM

## 2011-06-17 NOTE — Telephone Encounter (Signed)
Spoke with pt.  She would like to switch DMEs from [place] to [facility] for cpap supplies and requesting order be sent for this.  I advised pt I would send order.  She verbalized understanding of this--nothing further needed at this time.

## 2011-06-17 NOTE — Telephone Encounter (Signed)
Please put in an order thanks

## 2011-06-22 ENCOUNTER — Telehealth: Payer: Self-pay | Admitting: Internal Medicine

## 2011-06-22 DIAGNOSIS — G4733 Obstructive sleep apnea (adult) (pediatric): Secondary | ICD-10-CM

## 2011-06-22 NOTE — Telephone Encounter (Signed)
Order- [facility]- please d/c CPAP at [facility] and start new at [facility]. 15 cwp based on autodownload 03/06/11-                     Mask of choice, humidifer.  Dx OSA.   Enclose copy of diagnostic NPSG and the 03/06/11 download report.

## 2011-06-22 NOTE — Telephone Encounter (Signed)
Order placed to [facility] as requested.

## 2011-06-22 NOTE — Telephone Encounter (Signed)
Pt is changing from [facility] to [facility]. [facility] needs an order for a new CPAP machine and they want to know have you reviewed a recent download for the pt or is it ok for them to set the machine on auto and then get a download? Please advise. [place], CMA

## 2011-06-23 ENCOUNTER — Encounter: Payer: Self-pay | Admitting: Internal Medicine

## 2011-06-28 ENCOUNTER — Encounter: Payer: Self-pay | Admitting: *Deleted

## 2011-08-06 ENCOUNTER — Telehealth: Payer: Self-pay | Admitting: Oncology

## 2011-08-06 NOTE — Telephone Encounter (Signed)
Lvm advising appt 09/27/11 @ 3pm r/s'd from 11/6 appt. I have also mailed pt an appt calendar.

## 2011-09-24 ENCOUNTER — Encounter: Payer: Self-pay | Admitting: *Deleted

## 2011-09-27 ENCOUNTER — Ambulatory Visit: Payer: BC Managed Care – PPO | Admitting: Oncology

## 2011-09-27 ENCOUNTER — Other Ambulatory Visit: Payer: BC Managed Care – PPO | Admitting: Lab

## 2011-09-29 ENCOUNTER — Telehealth: Payer: Self-pay | Admitting: Oncology

## 2011-09-29 NOTE — Telephone Encounter (Signed)
Returned pt's call rescheduling 1/8 appt. gv pt new appt for 3/1.

## 2011-11-10 ENCOUNTER — Other Ambulatory Visit: Payer: Self-pay | Admitting: Gastroenterology

## 2011-11-18 ENCOUNTER — Ambulatory Visit: Payer: BC Managed Care – PPO | Admitting: Oncology

## 2011-11-18 ENCOUNTER — Other Ambulatory Visit: Payer: BC Managed Care – PPO | Admitting: Lab

## 2011-11-29 ENCOUNTER — Telehealth: Payer: Self-pay | Admitting: Oncology

## 2011-11-29 ENCOUNTER — Other Ambulatory Visit: Payer: Self-pay | Admitting: Oncology

## 2011-11-29 DIAGNOSIS — D6949 Other primary thrombocytopenia: Secondary | ICD-10-CM

## 2011-11-29 NOTE — Telephone Encounter (Signed)
Appt set and dixie made pt aware

## 2011-11-30 ENCOUNTER — Other Ambulatory Visit (HOSPITAL_BASED_OUTPATIENT_CLINIC_OR_DEPARTMENT_OTHER): Payer: BC Managed Care – PPO | Admitting: Lab

## 2011-11-30 ENCOUNTER — Ambulatory Visit (HOSPITAL_BASED_OUTPATIENT_CLINIC_OR_DEPARTMENT_OTHER): Payer: BC Managed Care – PPO | Admitting: Oncology

## 2011-11-30 VITALS — BP 159/77 | HR 84 | Temp 97.5°F | Ht 67.0 in | Wt 338.5 lb

## 2011-11-30 DIAGNOSIS — D6949 Other primary thrombocytopenia: Secondary | ICD-10-CM

## 2011-11-30 DIAGNOSIS — Z86010 Personal history of colonic polyps: Secondary | ICD-10-CM

## 2011-11-30 LAB — CBC WITH DIFFERENTIAL/PLATELET
BASO%: 0.4 % (ref 0.0–2.0)
Basophils Absolute: 0 10*3/uL (ref 0.0–0.1)
HCT: 34.7 % — ABNORMAL LOW (ref 34.8–46.6)
HGB: 11.7 g/dL (ref 11.6–15.9)
LYMPH%: 24.5 % (ref 14.0–49.7)
MCHC: 33.8 g/dL (ref 31.5–36.0)
MONO#: 0.3 10*3/uL (ref 0.1–0.9)
NEUT%: 64.8 % (ref 38.4–76.8)
Platelets: 50 10*3/uL — ABNORMAL LOW (ref 145–400)
WBC: 3.4 10*3/uL — ABNORMAL LOW (ref 3.9–10.3)
lymph#: 0.8 10*3/uL — ABNORMAL LOW (ref 0.9–3.3)

## 2011-11-30 NOTE — Progress Notes (Signed)
Hematology and Oncology Follow Up Visit  Hannah Khan [id number] [birth year] 56 y.o. 11/30/2011 1:16 PM  CC: Hannah Silence, MD Hannah Khan, M.D. Hannah Khan. Hannah Pulling, MD Hannah Khan, M.D.    Principle Diagnosis: This is a 56 year old female with the following issues:   1. Thrombocytopenia most likely autoimmune in nature.  She is currently on observation and surveillance. 2.     Status post a partial colectomy due to a tubular adenoma, without any evidence of high-grade dysplasia or malignancy.   Current therapy: Observation, surveillance  Interim History: Hannah Khan presents today for a follow-up visit.  Very pleasant 56 year old with autoimmune thrombocytopenia and underwent a successful hemicolectomy back in May 2011 for a tubular adenoma.  Again, did not have any evidence of high-grade dysplasia or malignancy.  She is doing very well from that standpoint, really has no residual effect at this time.  She had  reported one episode  with bleeding while she was on vacation (limited nose bleed), had not reported any abdominal distention.  Did not report any changes in her performance status.  She had not reported any r hematochezia.  She did report some changes in her activity level as well as generalized fatigue. She also reported few days of diarrhea. No active bleeding noted now. Bruising was noted on her flank and breast.   Medications: I have reviewed the patient's current medications. Current outpatient prescriptions:Cholecalciferol (VITAMIN D-3) 5000 UNITS TABS, Take 5,000 Units by mouth daily.  , Disp: , Rfl: ;  Coenzyme Q10 (CO Q 10 PO), Take 50 mg by mouth 3 (three) times daily.  , Disp: , Rfl: ;  IRON PO, Take 25 mg by mouth daily.  , Disp: , Rfl: ;  methocarbamol (ROBAXIN) 500 MG tablet, Take 500 mg by mouth 4 (four) times daily as needed.  , Disp: , Rfl:  Thiamine HCl (VITAMIN B-1) 100 MG tablet, Take 100 mg by mouth daily.  , Disp: , Rfl: ;  vitamin B-12 (CYANOCOBALAMIN)  500 MCG tablet, Take 500 mcg by mouth daily.  , Disp: , Rfl:   Allergies:  Allergies  Allergen Reactions  . Celecoxib     "speech slurred"  . Prednisone     Rapid HR  . Statins     aggrevate fibromyalgia    Past Medical History, Surgical history, Social history, and Family History were reviewed and updated.  Review of Systems: Constitutional:  Negative for fever, chills, night sweats, anorexia, weight loss, pain. Cardiovascular: no chest pain or dyspnea on exertion Respiratory: negative Neurological: negative Dermatological: negative ENT: negative Skin: Negative. Gastrointestinal: negative Genito-Urinary: no dysuria, trouble voiding, or hematuria Hematological and Lymphatic: negative Breast: negative Musculoskeletal: negative Remaining ROS negative. Physical Exam: Blood pressure 159/77, pulse 84, temperature 97.5 F (36.4 C), temperature source Oral, height 5' 7"  (1.702 m), weight 338 lb 8 oz (153.543 kg). ECOG: 1 General appearance: alert Head: Normocephalic, without obvious abnormality, atraumatic Neck: no adenopathy, no carotid bruit, supple, symmetrical, trachea midline and thyroid not enlarged, symmetric, no tenderness/mass/nodules Lymph nodes: Cervical, supraclavicular, and axillary nodes normal. Heart:regular rate and rhythm, S1, S2 normal, no murmur, click, rub or gallop Lung:chest clear, no wheezing, rales, normal symmetric air entry Abdomin: soft, non-tender, without masses or organomegaly EXT:no erythema, induration, or nodules. Skin: No petechia noted. Very few bruises noted.     Lab Results: Lab Results  Component Value Date   WBC 3.4* 11/30/2011   HGB 11.7 11/30/2011   HCT 34.7* 11/30/2011   MCV 89.7  11/30/2011   PLT 50* 11/30/2011     Chemistry      Component Value Date/Time   NA 141 01/20/2011 1257   K 4.2 01/20/2011 1257   CL 106 01/20/2011 1257   CO2 22 01/20/2011 1257   BUN 13 01/20/2011 1257   CREATININE 0.71 01/20/2011 1257      Component Value  Date/Time   CALCIUM 9.2 01/20/2011 1257   ALKPHOS 73 01/20/2011 1257   AST 63* 01/20/2011 1257   ALT 40* 01/20/2011 1257   BILITOT 0.6 01/20/2011 1257     Smear reviewed: No PRBC fragments. No dysplasia noted.   Impression and Plan:  This is a pleasant 56 year old female with the following issues:     1. Thrombocytopenia most likely autoimmune in nature.  For the time being, I do not really see any evidence or any indication for treatment at this point.  This most likely represents an autoimmune phenomenon such as ITP.   2. Weakness/ Fatigue/ worsening platelet count: unclear this is sign of a worsening hematological condition vs. Acute viral infection. I will continue to monitor closely and repeat count in 2 weeks. We will consider BM biopsy if her counts are declining.  3. Tubulovillous adenoma of the cecum, status post right hemicolectomy, no evidence of any dysplasia.       Hannah Button, MD 3/13/20131:16 PM

## 2011-12-14 ENCOUNTER — Telehealth: Payer: Self-pay | Admitting: Oncology

## 2011-12-14 ENCOUNTER — Other Ambulatory Visit (HOSPITAL_BASED_OUTPATIENT_CLINIC_OR_DEPARTMENT_OTHER): Payer: BC Managed Care – PPO | Admitting: Lab

## 2011-12-14 ENCOUNTER — Ambulatory Visit (HOSPITAL_BASED_OUTPATIENT_CLINIC_OR_DEPARTMENT_OTHER): Payer: BC Managed Care – PPO | Admitting: Oncology

## 2011-12-14 VITALS — BP 150/74 | HR 71 | Temp 97.0°F | Ht 67.0 in | Wt 337.1 lb

## 2011-12-14 DIAGNOSIS — R5383 Other fatigue: Secondary | ICD-10-CM

## 2011-12-14 DIAGNOSIS — R5381 Other malaise: Secondary | ICD-10-CM

## 2011-12-14 DIAGNOSIS — D6949 Other primary thrombocytopenia: Secondary | ICD-10-CM

## 2011-12-14 DIAGNOSIS — D693 Immune thrombocytopenic purpura: Secondary | ICD-10-CM

## 2011-12-14 LAB — CBC WITH DIFFERENTIAL/PLATELET
Basophils Absolute: 0 10*3/uL (ref 0.0–0.1)
EOS%: 1.9 % (ref 0.0–7.0)
Eosinophils Absolute: 0.1 10*3/uL (ref 0.0–0.5)
HCT: 36.1 % (ref 34.8–46.6)
HGB: 12.3 g/dL (ref 11.6–15.9)
LYMPH%: 22.4 % (ref 14.0–49.7)
MCH: 30.5 pg (ref 25.1–34.0)
MCV: 89.4 fL (ref 79.5–101.0)
MONO%: 8.4 % (ref 0.0–14.0)
NEUT#: 2.5 10*3/uL (ref 1.5–6.5)
NEUT%: 66.8 % (ref 38.4–76.8)
Platelets: 53 10*3/uL — ABNORMAL LOW (ref 145–400)
RDW: 15.6 % — ABNORMAL HIGH (ref 11.2–14.5)

## 2011-12-14 NOTE — Telephone Encounter (Signed)
gv pt appt schedule for June.

## 2011-12-14 NOTE — Progress Notes (Signed)
Hematology and Oncology Follow Up Visit  Hannah Khan [id number] [birth year] 56 y.o. 12/14/2011 3:42 PM  CC: Hannah Silence, MD Hannah Khan, M.D. Hannah Ruff. Redmond Pulling, MD Hannah Khan, M.D.    Principle Diagnosis: This is a 56 year old female with the following issues:   1. Thrombocytopenia most likely autoimmune in nature.  She is currently on observation and surveillance. 2.     Status post a partial colectomy due to a tubular adenoma, without any evidence of high-grade dysplasia or malignancy.   Current therapy: Observation, surveillance  Interim History: Hannah Khan presents today for a follow-up visit.  Very pleasant 56 year old with autoimmune thrombocytopenia and underwent a successful hemicolectomy back in May 2011 for a tubular adenoma.  She is doing very well from that standpoint, really has no residual effect at this time.   She presents for follow up visit after I saw her two weeks ago after she had complaints of weakness and fatigue and possible increase in her ecchymosis. She is doing a lot better since her last visit.  No bleeding or increased ecchymosis noted today.  Did not report any changes in her performance status.  She had not reported any  hematochezia.  She did report improvement her activity level and been back at work without decline.    Medications: I have reviewed the patient's current medications. Current outpatient prescriptions:Cholecalciferol (VITAMIN D-3) 5000 UNITS TABS, Take 5,000 Units by mouth daily.  , Disp: , Rfl: ;  Coenzyme Q10 (CO Q 10 PO), Take 50 mg by mouth 3 (three) times daily.  , Disp: , Rfl: ;  IRON PO, Take 25 mg by mouth daily.  , Disp: , Rfl: ;  methocarbamol (ROBAXIN) 500 MG tablet, Take 500 mg by mouth 4 (four) times daily as needed.  , Disp: , Rfl:  Thiamine HCl (VITAMIN B-1) 100 MG tablet, Take 100 mg by mouth daily.  , Disp: , Rfl: ;  vitamin B-12 (CYANOCOBALAMIN) 500 MCG tablet, Take 500 mcg by mouth daily.  , Disp: , Rfl:    Allergies:  Allergies  Allergen Reactions  . Celecoxib     "speech slurred"  . Prednisone     Rapid HR  . Statins     aggrevate fibromyalgia    Past Medical History, Surgical history, Social history, and Family History were reviewed and updated.  Review of Systems: Constitutional:  Negative for fever, chills, night sweats, anorexia, weight loss, pain. Cardiovascular: no chest pain or dyspnea on exertion Respiratory: negative Neurological: negative Dermatological: negative ENT: negative Skin: Negative. Gastrointestinal: negative Genito-Urinary: no dysuria, trouble voiding, or hematuria Hematological and Lymphatic: negative Breast: negative Musculoskeletal: negative Remaining ROS negative. Physical Exam: Blood pressure 150/74, pulse 71, temperature 97 F (36.1 C), temperature source Oral, height 5' 7"  (1.702 m), weight 337 lb 1.6 oz (152.908 kg). ECOG: 1 General appearance: alert Head: Normocephalic, without obvious abnormality, atraumatic Neck: no adenopathy, no carotid bruit, supple, symmetrical, trachea midline and thyroid not enlarged, symmetric, no tenderness/mass/nodules Lymph nodes: Cervical, supraclavicular, and axillary nodes normal. Heart:regular rate and rhythm, S1, S2 normal, no murmur, click, rub or gallop Lung:chest clear, no wheezing, rales, normal symmetric air entry Abdomin: soft, non-tender, without masses or organomegaly EXT:no erythema, induration, or nodules. Skin: No petechia noted. Very few bruises noted.     Lab Results: Lab Results  Component Value Date   WBC 3.7* 12/14/2011   HGB 12.3 12/14/2011   HCT 36.1 12/14/2011   MCV 89.4 12/14/2011   PLT 53* 12/14/2011  Chemistry      Component Value Date/Time   NA 141 01/20/2011 1257   K 4.2 01/20/2011 1257   CL 106 01/20/2011 1257   CO2 22 01/20/2011 1257   BUN 13 01/20/2011 1257   CREATININE 0.71 01/20/2011 1257      Component Value Date/Time   CALCIUM 9.2 01/20/2011 1257   ALKPHOS 73 01/20/2011 1257    AST 63* 01/20/2011 1257   ALT 40* 01/20/2011 1257   BILITOT 0.6 01/20/2011 1257     Smear reviewed: No PRBC fragments. No dysplasia noted.   Impression and Plan:  This is a pleasant 56 year old female with the following issues:     1. Thrombocytopenia most likely autoimmune in nature.  For the time being, I do not really see any evidence or any indication for treatment at this point.  This most likely represents an autoimmune phenomenon such as ITP.  Will continue to monitor her every three months and consider treatment if she develops bleeding or platelet count of 30 K or less.  2. Weakness/ Fatigue/ worsening platelet count: unclear this is sign of a worsening hematological condition vs. Acute viral infection. She is better today and likely represent a resolving viral illness. 3. Follow up: in 3 months.     Hannah Button, MD 3/27/20133:42 PM

## 2012-02-09 ENCOUNTER — Ambulatory Visit: Payer: BC Managed Care – PPO | Admitting: Internal Medicine

## 2012-03-15 ENCOUNTER — Other Ambulatory Visit: Payer: BC Managed Care – PPO | Admitting: Lab

## 2012-03-15 ENCOUNTER — Ambulatory Visit: Payer: BC Managed Care – PPO | Admitting: Oncology

## 2012-03-16 ENCOUNTER — Telehealth: Payer: Self-pay | Admitting: Oncology

## 2012-03-16 NOTE — Telephone Encounter (Signed)
Returned pt's call and gv pt new appt for 7/11

## 2012-03-29 ENCOUNTER — Ambulatory Visit (HOSPITAL_BASED_OUTPATIENT_CLINIC_OR_DEPARTMENT_OTHER): Payer: BC Managed Care – PPO | Admitting: Oncology

## 2012-03-29 ENCOUNTER — Other Ambulatory Visit (HOSPITAL_BASED_OUTPATIENT_CLINIC_OR_DEPARTMENT_OTHER): Payer: BC Managed Care – PPO | Admitting: Lab

## 2012-03-29 ENCOUNTER — Telehealth: Payer: Self-pay | Admitting: Oncology

## 2012-03-29 VITALS — BP 145/72 | HR 88 | Temp 97.0°F | Ht 67.0 in | Wt 340.7 lb

## 2012-03-29 DIAGNOSIS — D696 Thrombocytopenia, unspecified: Secondary | ICD-10-CM

## 2012-03-29 DIAGNOSIS — D693 Immune thrombocytopenic purpura: Secondary | ICD-10-CM

## 2012-03-29 DIAGNOSIS — R5381 Other malaise: Secondary | ICD-10-CM

## 2012-03-29 LAB — COMPREHENSIVE METABOLIC PANEL
ALT: 34 U/L (ref 0–35)
AST: 64 U/L — ABNORMAL HIGH (ref 0–37)
Alkaline Phosphatase: 77 U/L (ref 39–117)
Creatinine, Ser: 0.61 mg/dL (ref 0.50–1.10)
Sodium: 138 mEq/L (ref 135–145)
Total Bilirubin: 1 mg/dL (ref 0.3–1.2)
Total Protein: 7.4 g/dL (ref 6.0–8.3)

## 2012-03-29 LAB — CBC WITH DIFFERENTIAL/PLATELET
EOS%: 1.4 % (ref 0.0–7.0)
Eosinophils Absolute: 0.1 10*3/uL (ref 0.0–0.5)
LYMPH%: 21.5 % (ref 14.0–49.7)
MCH: 30.5 pg (ref 25.1–34.0)
MCHC: 33.9 g/dL (ref 31.5–36.0)
MCV: 89.8 fL (ref 79.5–101.0)
MONO%: 9.1 % (ref 0.0–14.0)
Platelets: 55 10*3/uL — ABNORMAL LOW (ref 145–400)
RBC: 4 10*6/uL (ref 3.70–5.45)
RDW: 15.5 % — ABNORMAL HIGH (ref 11.2–14.5)

## 2012-03-29 NOTE — Telephone Encounter (Signed)
appts made and printed for pt aom °

## 2012-03-29 NOTE — Progress Notes (Signed)
Hematology and Oncology Follow Up Visit  YASHA TIBBETT [id number] 09/03/1956 56 y.o. 03/29/2012 4:14 PM  CC: [name], MD [name], M.D. Leighton Ruff. Redmond Pulling, MD Bo Merino, M.D.    Principle Diagnosis: This is a 56 year old female with the following issues:   1. Thrombocytopenia most likely autoimmune in nature.  She is currently on observation and surveillance. 2.     Status post a partial colectomy due to a tubular adenoma, without any evidence of high-grade dysplasia or malignancy.   Current therapy: Observation, surveillance  Interim History: Mrs. Nienow presents today for a follow-up visit.  Very pleasant 56 year old with autoimmune thrombocytopenia and underwent a successful hemicolectomy back in May 2011 for a tubular adenoma.  She is doing very well from that standpoint, really has no residual effect at this time. She is doing well since her last visit. No bleeding or increased ecchymosis noted today.  Did not report any changes in her performance status.  She had not reported any  hematochezia.  She did report improvement her activity level and been back at work without decline. She did have one episode of epistaxis while she was on a cruise but stopped quickly.    Medications: I have reviewed the patient's current medications. Current outpatient prescriptions:Cholecalciferol (VITAMIN D-3) 5000 UNITS TABS, Take 5,000 Units by mouth daily.  , Disp: , Rfl: ;  Coenzyme Q10 (CO Q 10 PO), Take 50 mg by mouth 3 (three) times daily.  , Disp: , Rfl: ;  hydrochlorothiazide (MICROZIDE) 12.5 MG capsule, Take 12.5 mg by mouth daily., Disp: , Rfl: ;  IRON PO, Take 25 mg by mouth daily.  , Disp: , Rfl:  methocarbamol (ROBAXIN) 500 MG tablet, Take 500 mg by mouth 4 (four) times daily as needed.  , Disp: , Rfl: ;  Thiamine HCl (VITAMIN B-1) 100 MG tablet, Take 100 mg by mouth daily.  , Disp: , Rfl: ;  vitamin B-12 (CYANOCOBALAMIN) 500 MCG tablet, Take 500 mcg by mouth daily.  ,  Disp: , Rfl:   Allergies:  Allergies  Allergen Reactions  . Celecoxib     "speech slurred"  . Prednisone     Rapid HR  . Statins     aggrevate fibromyalgia    Past Medical History, Surgical history, Social history, and Family History were reviewed and updated.  Review of Systems: Constitutional:  Negative for fever, chills, night sweats, anorexia, weight loss, pain. Cardiovascular: no chest pain or dyspnea on exertion Respiratory: negative Neurological: negative Dermatological: negative ENT: negative Skin: Negative. Gastrointestinal: negative Genito-Urinary: no dysuria, trouble voiding, or hematuria Hematological and Lymphatic: negative Breast: negative Musculoskeletal: negative Remaining ROS negative. Physical Exam: Blood pressure 145/72, pulse 88, temperature 97 F (36.1 C), temperature source Oral, height 5' 7"  (1.702 m), weight 340 lb 11.2 oz (154.541 kg). ECOG: 1 General appearance: alert Head: Normocephalic, without obvious abnormality, atraumatic Neck: no adenopathy, no carotid bruit, supple, symmetrical, trachea midline and thyroid not enlarged, symmetric, no tenderness/mass/nodules Lymph nodes: Cervical, supraclavicular, and axillary nodes normal. Heart:regular rate and rhythm, S1, S2 normal, no murmur, click, rub or gallop Lung:chest clear, no wheezing, rales, normal symmetric air entry Abdomin: soft, non-tender, without masses or organomegaly EXT:no erythema, induration, or nodules. Skin: No petechia noted. Very few bruises noted.     Lab Results: Lab Results  Component Value Date   WBC 4.8 03/29/2012   HGB 12.2 03/29/2012   HCT 35.9 03/29/2012   MCV 89.8 03/29/2012   PLT 55* 03/29/2012  Chemistry      Component Value Date/Time   NA 141 01/20/2011 1257   K 4.2 01/20/2011 1257   CL 106 01/20/2011 1257   CO2 22 01/20/2011 1257   BUN 13 01/20/2011 1257   CREATININE 0.71 01/20/2011 1257      Component Value Date/Time   CALCIUM 9.2 01/20/2011 1257   ALKPHOS 73  01/20/2011 1257   AST 63* 01/20/2011 1257   ALT 40* 01/20/2011 1257   BILITOT 0.6 01/20/2011 1257     Smear reviewed: No PRBC fragments. No dysplasia noted.   Impression and Plan:  This is a pleasant 56 year old female with the following issues:     1. Thrombocytopenia most likely autoimmune in nature.  For the time being, I do not really see any evidence or any indication for treatment at this point.  This most likely represents an autoimmune phenomenon such as ITP.  Will continue to monitor her every four months and consider treatment if she develops bleeding or platelet count of 30 K or less.  2. Weakness/ Fatigue/ worsening platelet count: unclear this is sign of a worsening hematological condition vs. Acute viral infection. She is better now and likely represent a resolving viral illness. 3. Follow up: in 4 months.     [facility], MD 7/11/20134:14 PM

## 2012-08-03 ENCOUNTER — Telehealth: Payer: Self-pay | Admitting: Oncology

## 2012-08-03 ENCOUNTER — Other Ambulatory Visit (HOSPITAL_BASED_OUTPATIENT_CLINIC_OR_DEPARTMENT_OTHER): Payer: BC Managed Care – PPO

## 2012-08-03 ENCOUNTER — Ambulatory Visit (HOSPITAL_BASED_OUTPATIENT_CLINIC_OR_DEPARTMENT_OTHER): Payer: BC Managed Care – PPO | Admitting: Oncology

## 2012-08-03 VITALS — BP 144/73 | HR 79 | Temp 97.2°F | Resp 22 | Ht 67.0 in | Wt 340.3 lb

## 2012-08-03 DIAGNOSIS — D649 Anemia, unspecified: Secondary | ICD-10-CM

## 2012-08-03 DIAGNOSIS — D696 Thrombocytopenia, unspecified: Secondary | ICD-10-CM

## 2012-08-03 DIAGNOSIS — D6949 Other primary thrombocytopenia: Secondary | ICD-10-CM

## 2012-08-03 LAB — CBC WITH DIFFERENTIAL/PLATELET
BASO%: 0.3 % (ref 0.0–2.0)
HCT: 34.5 % — ABNORMAL LOW (ref 34.8–46.6)
LYMPH%: 26.6 % (ref 14.0–49.7)
MCH: 29.9 pg (ref 25.1–34.0)
MCHC: 33.3 g/dL (ref 31.5–36.0)
MCV: 89.8 fL (ref 79.5–101.0)
MONO#: 0.3 10*3/uL (ref 0.1–0.9)
MONO%: 8.2 % (ref 0.0–14.0)
NEUT%: 63.4 % (ref 38.4–76.8)
Platelets: 33 10*3/uL — ABNORMAL LOW (ref 145–400)
RBC: 3.84 10*6/uL (ref 3.70–5.45)
WBC: 3.3 10*3/uL — ABNORMAL LOW (ref 3.9–10.3)

## 2012-08-03 LAB — COMPREHENSIVE METABOLIC PANEL (CC13)
ALT: 33 U/L (ref 0–55)
AST: 48 U/L — ABNORMAL HIGH (ref 5–34)
Albumin: 3.5 g/dL (ref 3.5–5.0)
Alkaline Phosphatase: 72 U/L (ref 40–150)
BUN: 16 mg/dL (ref 7.0–26.0)
CO2: 26 mEq/L (ref 22–29)
Calcium: 9.2 mg/dL (ref 8.4–10.4)
Chloride: 107 mEq/L (ref 98–107)
Creatinine: 0.8 mg/dL (ref 0.6–1.1)
Glucose: 172 mg/dl — ABNORMAL HIGH (ref 70–99)
Potassium: 3.7 mEq/L (ref 3.5–5.1)
Sodium: 141 mEq/L (ref 136–145)
Total Bilirubin: 2.06 mg/dL — ABNORMAL HIGH (ref 0.20–1.20)
Total Protein: 6.9 g/dL (ref 6.4–8.3)

## 2012-08-03 NOTE — Telephone Encounter (Signed)
Gave pt appt for  11/01/12.

## 2012-08-05 ENCOUNTER — Encounter: Payer: Self-pay | Admitting: Oncology

## 2012-08-05 NOTE — Progress Notes (Signed)
Hematology and Oncology Follow Up Visit  Hannah Khan [id number] [birth year] 56 y.o. 08/05/2012 8:19 PM  CC: Hannah Silence, MD Hannah Khan, M.D. Hannah Khan. Hannah Pulling, MD Hannah Khan, M.D.    Principle Diagnosis: This is a 56 year old female with the following issues:   1. Thrombocytopenia most likely autoimmune in nature.  She is currently on observation and surveillance. 2.     Status post a partial colectomy due to a tubular adenoma, without any evidence of high-grade dysplasia or malignancy.   Current therapy: Observation, surveillance  Interim History: Hannah Khan presents today for a follow-up visit.  Very pleasant 56 year old with autoimmune thrombocytopenia and underwent a successful hemicolectomy back in May 2011 for a tubular adenoma.  She is doing very well from that standpoint, really has no residual effect at this time. She is doing well since her last visit. She experience 2-3 nosebleeds last week that stopped quickly. Did not report any changes in her performance status.  She had not reported any  hematochezia.  She did report improvement her activity level and been back at work without decline.   Medications: I have reviewed the patient's current medications. Current outpatient prescriptions:Cholecalciferol (VITAMIN D-3) 5000 UNITS TABS, Take 5,000 Units by mouth daily.  , Disp: , Rfl: ;  Coenzyme Q10 (CO Q 10 PO), Take 50 mg by mouth 3 (three) times daily.  , Disp: , Rfl: ;  hydrochlorothiazide (MICROZIDE) 12.5 MG capsule, Take 12.5 mg by mouth daily., Disp: , Rfl: ;  IRON PO, Take 25 mg by mouth daily.  , Disp: , Rfl:  methocarbamol (ROBAXIN) 500 MG tablet, Take 500 mg by mouth 4 (four) times daily as needed.  , Disp: , Rfl: ;  Thiamine HCl (VITAMIN B-1) 100 MG tablet, Take 100 mg by mouth daily.  , Disp: , Rfl: ;  vitamin B-12 (CYANOCOBALAMIN) 500 MCG tablet, Take 500 mcg by mouth daily.  , Disp: , Rfl:   Allergies:  Allergies  Allergen Reactions  . Celecoxib       "speech slurred"  . Prednisone     Rapid HR  . Statins     aggrevate fibromyalgia    Past Medical History, Surgical history, Social history, and Family History were reviewed and updated.  Review of Systems: Constitutional:  Negative for fever, chills, night sweats, anorexia, weight loss, pain. Cardiovascular: no chest pain or dyspnea on exertion Respiratory: negative Neurological: negative Dermatological: negative ENT: negative Skin: Negative. Gastrointestinal: negative Genito-Urinary: no dysuria, trouble voiding, or hematuria Hematological and Lymphatic: negative Breast: negative Musculoskeletal: negative Remaining ROS negative.  Physical Exam: Blood pressure 144/73, pulse 79, temperature 97.2 F (36.2 C), temperature source Oral, resp. rate 22, height 5' 7"  (1.702 m), weight 340 lb 4.8 oz (154.359 kg). ECOG: 1 General appearance: alert Head: Normocephalic, without obvious abnormality, atraumatic Neck: no adenopathy, no carotid bruit, supple, symmetrical, trachea midline and thyroid not enlarged, symmetric, no tenderness/mass/nodules Lymph nodes: Cervical, supraclavicular, and axillary nodes normal. Heart:regular rate and rhythm, S1, S2 normal, no murmur, click, rub or gallop Lung:chest clear, no wheezing, rales, normal symmetric air entry Abdomen: soft, non-tender, without masses or organomegaly EXT:no erythema, induration, or nodules. Skin: No petechia noted. Very few bruises noted.     Lab Results: Lab Results  Component Value Date   WBC 3.3* 08/03/2012   HGB 11.5* 08/03/2012   HCT 34.5* 08/03/2012   MCV 89.8 08/03/2012   PLT 33* 08/03/2012     Chemistry      Component Value Date/Time  NA 141 08/03/2012 1449   NA 138 03/29/2012 1535   K 3.7 08/03/2012 1449   K 3.8 03/29/2012 1535   CL 107 08/03/2012 1449   CL 103 03/29/2012 1535   CO2 26 08/03/2012 1449   CO2 27 03/29/2012 1535   BUN 16.0 08/03/2012 1449   BUN 16 03/29/2012 1535   CREATININE 0.8  08/03/2012 1449   CREATININE 0.61 03/29/2012 1535      Component Value Date/Time   CALCIUM 9.2 08/03/2012 1449   CALCIUM 9.6 03/29/2012 1535   ALKPHOS 72 08/03/2012 1449   ALKPHOS 77 03/29/2012 1535   AST 48* 08/03/2012 1449   AST 64* 03/29/2012 1535   ALT 33 08/03/2012 1449   ALT 34 03/29/2012 1535   BILITOT 2.06* 08/03/2012 1449   BILITOT 1.0 03/29/2012 1535     Impression and Plan:  This is a pleasant 56 year old female with the following issues:     1. Thrombocytopenia most likely autoimmune in nature.  For the time being, I do not really see any evidence or any indication for treatment at this point.  This most likely represents an autoimmune phenomenon such as ITP.  Will continue to monitor her every three months and consider treatment if she develops bleeding or platelet count of 30 K or less.  2. Anemia; mild. States she has not been taking her iron supplement recently. She plan to restart this. No transfusion is indicated. 3. Follow up: in 3 months.     Hannah Khan 11/17/20138:19 PM

## 2012-11-01 ENCOUNTER — Other Ambulatory Visit: Payer: BC Managed Care – PPO

## 2012-11-01 ENCOUNTER — Ambulatory Visit: Payer: BC Managed Care – PPO | Admitting: Oncology

## 2012-11-01 ENCOUNTER — Telehealth: Payer: Self-pay | Admitting: Oncology

## 2012-11-01 NOTE — Telephone Encounter (Signed)
pt called to r/s due to weather

## 2012-11-26 ENCOUNTER — Other Ambulatory Visit: Payer: Self-pay | Admitting: Orthopedic Surgery

## 2012-11-26 DIAGNOSIS — M25521 Pain in right elbow: Secondary | ICD-10-CM

## 2012-12-02 ENCOUNTER — Ambulatory Visit
Admission: RE | Admit: 2012-12-02 | Discharge: 2012-12-02 | Disposition: A | Discharge status: Home / Self Care | Payer: BC Managed Care – PPO | Source: Ambulatory Visit | Attending: Orthopedic Surgery | Admitting: Orthopedic Surgery

## 2012-12-02 DIAGNOSIS — M25521 Pain in right elbow: Secondary | ICD-10-CM

## 2012-12-14 ENCOUNTER — Telehealth: Payer: Self-pay | Admitting: Oncology

## 2012-12-14 ENCOUNTER — Other Ambulatory Visit (HOSPITAL_BASED_OUTPATIENT_CLINIC_OR_DEPARTMENT_OTHER): Payer: BC Managed Care – PPO | Admitting: Lab

## 2012-12-14 ENCOUNTER — Ambulatory Visit (HOSPITAL_BASED_OUTPATIENT_CLINIC_OR_DEPARTMENT_OTHER): Payer: BC Managed Care – PPO | Admitting: Oncology

## 2012-12-14 VITALS — BP 152/70 | HR 86 | Temp 97.4°F | Resp 22 | Ht 67.0 in | Wt 343.4 lb

## 2012-12-14 DIAGNOSIS — D6949 Other primary thrombocytopenia: Secondary | ICD-10-CM

## 2012-12-14 DIAGNOSIS — D696 Thrombocytopenia, unspecified: Secondary | ICD-10-CM

## 2012-12-14 LAB — CBC WITH DIFFERENTIAL/PLATELET
BASO%: 0.5 % (ref 0.0–2.0)
Basophils Absolute: 0 10*3/uL (ref 0.0–0.1)
EOS%: 1.5 % (ref 0.0–7.0)
HCT: 36 % (ref 34.8–46.6)
HGB: 12.2 g/dL (ref 11.6–15.9)
LYMPH%: 24.4 % (ref 14.0–49.7)
MCH: 29.9 pg (ref 25.1–34.0)
MCHC: 33.9 g/dL (ref 31.5–36.0)
MONO#: 0.4 10*3/uL (ref 0.1–0.9)
NEUT%: 64.3 % (ref 38.4–76.8)
Platelets: 40 10*3/uL — ABNORMAL LOW (ref 145–400)

## 2012-12-14 NOTE — Telephone Encounter (Signed)
Gave pt appt for lab and MD visit for July 2014

## 2012-12-14 NOTE — Progress Notes (Signed)
Hematology and Oncology Follow Up Visit  ARAH ARO [id number] 09-Jul-1956 57 y.o. 12/14/2012 3:31 PM  CC: [name], MD [name], M.D. Leighton Ruff. Redmond Pulling, MD Bo Merino, M.D.    Principle Diagnosis: This is a 57 year old female with the following issues:   1. Thrombocytopenia most likely autoimmune in nature.  She is currently on observation and surveillance. 2.     Status post a partial colectomy due to a tubular adenoma, without any evidence of high-grade dysplasia or malignancy.   Current therapy: Observation, surveillance  Interim History: Mrs. [place]burg presents today for a follow-up visit.  Very pleasant 57 year old with autoimmune thrombocytopenia and underwent a successful hemicolectomy back in May 2011 for a tubular adenoma.  She is doing very well from that standpoint, really has no residual effect at this time. She is doing well since her last visit. Did not report any changes in her performance status.  She had not reported any  hematochezia.  She did report improvement her activity level and been back at work without decline.  No other bleeding episodes.   Medications: I have reviewed the patient's current medications. Current outpatient prescriptions:Cholecalciferol (VITAMIN D-3) 5000 UNITS TABS, Take 5,000 Units by mouth daily.  , Disp: , Rfl: ;  Coenzyme Q10 (CO Q 10 PO), Take 50 mg by mouth 3 (three) times daily.  , Disp: , Rfl: ;  hydrochlorothiazide (MICROZIDE) 12.5 MG capsule, Take 12.5 mg by mouth daily., Disp: , Rfl: ;  IRON PO, Take 25 mg by mouth daily.  , Disp: , Rfl:  methocarbamol (ROBAXIN) 500 MG tablet, Take 500 mg by mouth 4 (four) times daily as needed.  , Disp: , Rfl: ;  Thiamine HCl (VITAMIN B-1) 100 MG tablet, Take 100 mg by mouth daily.  , Disp: , Rfl: ;  vitamin B-12 (CYANOCOBALAMIN) 500 MCG tablet, Take 500 mcg by mouth daily.  , Disp: , Rfl:   Allergies:  Allergies  Allergen Reactions  . Celecoxib     "speech slurred"  . Prednisone      Rapid HR  . Statins     aggrevate fibromyalgia    Past Medical History, Surgical history, Social history, and Family History were reviewed and updated.  Review of Systems: Constitutional:  Negative for fever, chills, night sweats, anorexia, weight loss, pain. Cardiovascular: no chest pain or dyspnea on exertion Respiratory: negative Neurological: negative Dermatological: negative ENT: negative Skin: Negative. Gastrointestinal: negative Genito-Urinary: no dysuria, trouble voiding, or hematuria Hematological and Lymphatic: negative Breast: negative Musculoskeletal: negative Remaining ROS negative.  Physical Exam: Blood pressure 152/70, pulse 86, temperature 97.4 F (36.3 C), temperature source Oral, resp. rate 22, height 5' 7"  (1.702 m), weight 343 lb 6.4 oz (155.765 kg). ECOG: 1 General appearance: alert Head: Normocephalic, without obvious abnormality, atraumatic Neck: no adenopathy, no carotid bruit, supple, symmetrical, trachea midline and thyroid not enlarged, symmetric, no tenderness/mass/nodules Lymph nodes: Cervical, supraclavicular, and axillary nodes normal. Heart:regular rate and rhythm, S1, S2 normal, no murmur, click, rub or gallop Lung:chest clear, no wheezing, rales, normal symmetric air entry Abdomen: soft, non-tender, without masses or organomegaly EXT:no erythema, induration, or nodules. Skin: No petechia noted. Very few bruises noted.     Lab Results: Lab Results  Component Value Date   WBC 4.4 12/14/2012   HGB 12.2 12/14/2012   HCT 36.0 12/14/2012   MCV 88.2 12/14/2012   PLT 40* 12/14/2012     Chemistry      Component Value Date/Time   NA 141 08/03/2012  1449   NA 138 03/29/2012 1535   K 3.7 08/03/2012 1449   K 3.8 03/29/2012 1535   CL 107 08/03/2012 1449   CL 103 03/29/2012 1535   CO2 26 08/03/2012 1449   CO2 27 03/29/2012 1535   BUN 16.0 08/03/2012 1449   BUN 16 03/29/2012 1535   CREATININE 0.8 08/03/2012 1449   CREATININE 0.61 03/29/2012 1535       Component Value Date/Time   CALCIUM 9.2 08/03/2012 1449   CALCIUM 9.6 03/29/2012 1535   ALKPHOS 72 08/03/2012 1449   ALKPHOS 77 03/29/2012 1535   AST 48* 08/03/2012 1449   AST 64* 03/29/2012 1535   ALT 33 08/03/2012 1449   ALT 34 03/29/2012 1535   BILITOT 2.06* 08/03/2012 1449   BILITOT 1.0 03/29/2012 1535     Impression and Plan:  This is a pleasant 57 year old female with the following issues:     1. Thrombocytopenia most likely autoimmune in nature.  For the time being, I do not really see any evidence or any indication for treatment at this point.  This most likely represents an autoimmune phenomenon such as ITP.  Will continue to monitor her every four months and consider treatment if she develops bleeding or platelet count of 30 K or less.  2. Anemia; Resolved now. 3. Follow up: in 4 months.     EIHDTP,NSQZY 3/28/20143:31 PM

## 2013-03-14 ENCOUNTER — Other Ambulatory Visit: Payer: Self-pay | Admitting: Nurse Practitioner

## 2013-03-14 ENCOUNTER — Other Ambulatory Visit (HOSPITAL_COMMUNITY)
Admission: RE | Admit: 2013-03-14 | Discharge: 2013-03-14 | Disposition: A | Discharge status: Home / Self Care | Payer: BC Managed Care – PPO | Source: Ambulatory Visit | Attending: Obstetrics and Gynecology | Admitting: Obstetrics and Gynecology

## 2013-03-14 DIAGNOSIS — Z01419 Encounter for gynecological examination (general) (routine) without abnormal findings: Secondary | ICD-10-CM | POA: Insufficient documentation

## 2013-04-15 ENCOUNTER — Ambulatory Visit (HOSPITAL_BASED_OUTPATIENT_CLINIC_OR_DEPARTMENT_OTHER): Payer: BC Managed Care – PPO | Admitting: Oncology

## 2013-04-15 ENCOUNTER — Telehealth: Payer: Self-pay | Admitting: Oncology

## 2013-04-15 ENCOUNTER — Other Ambulatory Visit (HOSPITAL_BASED_OUTPATIENT_CLINIC_OR_DEPARTMENT_OTHER): Payer: BC Managed Care – PPO

## 2013-04-15 ENCOUNTER — Encounter: Payer: Self-pay | Admitting: Oncology

## 2013-04-15 VITALS — BP 165/49 | HR 90 | Temp 98.1°F | Resp 20 | Ht 67.0 in | Wt 347.1 lb

## 2013-04-15 DIAGNOSIS — D6949 Other primary thrombocytopenia: Secondary | ICD-10-CM

## 2013-04-15 LAB — CBC WITH DIFFERENTIAL/PLATELET
BASO%: 0.5 % (ref 0.0–2.0)
Basophils Absolute: 0 10*3/uL (ref 0.0–0.1)
EOS%: 1.5 % (ref 0.0–7.0)
HCT: 35.2 % (ref 34.8–46.6)
HGB: 12 g/dL (ref 11.6–15.9)
MCH: 30.1 pg (ref 25.1–34.0)
MCHC: 34 g/dL (ref 31.5–36.0)
MCV: 88.6 fL (ref 79.5–101.0)
MONO%: 8.7 % (ref 0.0–14.0)
NEUT%: 63.4 % (ref 38.4–76.8)
lymph#: 0.8 10*3/uL — ABNORMAL LOW (ref 0.9–3.3)

## 2013-04-15 LAB — COMPREHENSIVE METABOLIC PANEL (CC13)
ALT: 25 U/L (ref 0–55)
AST: 47 U/L — ABNORMAL HIGH (ref 5–34)
Alkaline Phosphatase: 82 U/L (ref 40–150)
BUN: 12.3 mg/dL (ref 7.0–26.0)
Calcium: 9.6 mg/dL (ref 8.4–10.4)
Chloride: 107 mEq/L (ref 98–109)
Creatinine: 0.8 mg/dL (ref 0.6–1.1)
Total Bilirubin: 1.47 mg/dL — ABNORMAL HIGH (ref 0.20–1.20)

## 2013-04-15 NOTE — Progress Notes (Signed)
Hematology and Oncology Follow Up Visit  Hannah Khan [id number] [birth year] 57 y.o. 04/15/2013 2:20 PM  CC: [name], MD [name], M.D. Leighton Ruff. Redmond Pulling, MD Bo Merino, M.D.    Principle Diagnosis: This is a 57 year old female with the following issues:   1. Thrombocytopenia most likely autoimmune in nature.  She is currently on observation and surveillance. 2.     Status post a partial colectomy due to a tubular adenoma, without any evidence of high-grade dysplasia or malignancy.   Current therapy: Observation, surveillance  Interim History: Hannah Khan presents today for a follow-up visit.  Very pleasant 57 year old with autoimmune thrombocytopenia and underwent a successful hemicolectomy back in May 2011 for a tubular adenoma.  She is doing very well from that standpoint, really has no residual effect at this time. She is doing well since her last visit. Did not report any changes in her performance status.  She had not reported any  hematochezia.  She did report improvement her activity level and been back at work without decline. No other bleeding episodes.   Medications: I have reviewed the patient's current medications. Current outpatient prescriptions:Cholecalciferol (VITAMIN D-3) 5000 UNITS TABS, Take 5,000 Units by mouth daily.  , Disp: , Rfl: ;  Coenzyme Q10 (CO Q 10 PO), Take 50 mg by mouth 3 (three) times daily.  , Disp: , Rfl: ;  hydrochlorothiazide (MICROZIDE) 12.5 MG capsule, Take 12.5 mg by mouth daily., Disp: , Rfl: ;  IRON PO, Take 25 mg by mouth daily.  , Disp: , Rfl:  methocarbamol (ROBAXIN) 500 MG tablet, Take 500 mg by mouth 4 (four) times daily as needed.  , Disp: , Rfl: ;  Thiamine HCl (VITAMIN B-1) 100 MG tablet, Take 100 mg by mouth daily.  , Disp: , Rfl: ;  vitamin B-12 (CYANOCOBALAMIN) 500 MCG tablet, Take 500 mcg by mouth daily.  , Disp: , Rfl:   Allergies:  Allergies  Allergen Reactions  . Celecoxib     "speech slurred"  . Prednisone      Rapid HR  . Statins     aggrevate fibromyalgia    Past Medical History, Surgical history, Social history, and Family History were reviewed and updated.  Review of Systems: Constitutional:  Negative for fever, chills, night sweats, anorexia, weight loss, pain. Cardiovascular: no chest pain or dyspnea on exertion Respiratory: negative Neurological: negative Dermatological: negative ENT: negative Skin: Negative. Gastrointestinal: negative Genito-Urinary: no dysuria, trouble voiding, or hematuria Hematological and Lymphatic: negative Breast: negative Musculoskeletal: negative Remaining ROS negative.  Physical Exam: Blood pressure 165/49, pulse 90, temperature 98.1 F (36.7 C), temperature source Oral, resp. rate 20, height 5' 7"  (1.702 m), weight 347 lb 1.6 oz (157.444 kg). ECOG: 1 General appearance: alert Head: Normocephalic, without obvious abnormality, atraumatic Neck: no adenopathy, no carotid bruit, supple, symmetrical, trachea midline and thyroid not enlarged, symmetric, no tenderness/mass/nodules Lymph nodes: Cervical, supraclavicular, and axillary nodes normal. Heart:regular rate and rhythm, S1, S2 normal, no murmur, click, rub or gallop Lung:chest clear, no wheezing, rales, normal symmetric air entry Abdomen: soft, non-tender, without masses or organomegaly EXT:no erythema, induration, or nodules. Skin: No petechia noted. Very few bruises noted.     Lab Results: Lab Results  Component Value Date   WBC 3.1* 04/15/2013   HGB 12.0 04/15/2013   HCT 35.2 04/15/2013   MCV 88.6 04/15/2013   PLT 41* 04/15/2013     Chemistry      Component Value Date/Time   NA 143 04/15/2013 1251  NA 138 03/29/2012 1535   K 4.1 04/15/2013 1251   K 3.8 03/29/2012 1535   CL 107 08/03/2012 1449   CL 103 03/29/2012 1535   CO2 26 04/15/2013 1251   CO2 27 03/29/2012 1535   BUN 12.3 04/15/2013 1251   BUN 16 03/29/2012 1535   CREATININE 0.8 04/15/2013 1251   CREATININE 0.61 03/29/2012 1535       Component Value Date/Time   CALCIUM 9.6 04/15/2013 1251   CALCIUM 9.6 03/29/2012 1535   ALKPHOS 82 04/15/2013 1251   ALKPHOS 77 03/29/2012 1535   AST 47* 04/15/2013 1251   AST 64* 03/29/2012 1535   ALT 25 04/15/2013 1251   ALT 34 03/29/2012 1535   BILITOT 1.47* 04/15/2013 1251   BILITOT 1.0 03/29/2012 1535     Impression and Plan:  This is a pleasant 57 year old female with the following issues:     1. Thrombocytopenia most likely autoimmune in nature.  For the time being, I do not really see any evidence or any indication for treatment at this point.  This most likely represents an autoimmune phenomenon such as ITP.  Will continue to monitor her every four months and consider treatment if she develops bleeding or platelet count of 30 K or less.  2. Anemia; Resolved now. 3. Follow up: in 4 months.     Mikey Bussing 7/28/20142:20 PM

## 2013-04-15 NOTE — Telephone Encounter (Signed)
gave pt appt for lab and MD on December 2014 per pt rqst

## 2013-04-15 NOTE — Patient Instructions (Signed)
Results for CHIANA, WAMSER (MRN [id number]) as of 04/15/2013 13:39  Ref. Range 04/15/2013 12:51  WBC Latest Range: 4.0-10.5 K/uL 3.1 (L)  RBC Latest Range: 3.87-5.11 MIL/uL 3.98  Hemoglobin Latest Range: 12.0-15.0 g/dL 12.0  HCT Latest Range: 36.0-46.0 % 35.2  MCV Latest Range: 78.0-100.0 fL 88.6  MCH Latest Range: 25.1-34.0 pg 30.1  MCHC Latest Range: 30.0-36.0 g/dL 34.0  RDW Latest Range: 11.5-15.5 % 15.4 (H)  Platelets Latest Range: 150-400 K/uL 41 (L)  NEUT% Latest Range: 38.4-76.8 % 63.4  LYMPH% Latest Range: 14.0-49.7 % 25.9  MONO% Latest Range: 0.0-14.0 % 8.7  EOS% Latest Range: 0.0-7.0 % 1.5  BASO% Latest Range: 0.0-2.0 % 0.5  NEUT# Latest Range: 1.7-7.7 K/uL 1.9  MONO# Latest Range: 0.1-0.9 10e3/uL 0.3  Eosinophils Absolute Latest Range: 0.0-0.5 10e3/uL 0.0  Basophils Absolute Latest Range: 0.0-0.1 K/uL 0.0  lymph# Latest Range: 0.9-3.3 10e3/uL 0.8 (L)

## 2013-07-20 ENCOUNTER — Emergency Department (HOSPITAL_COMMUNITY)
Admission: EM | Admit: 2013-07-20 | Discharge: 2013-07-20 | Disposition: A | Discharge status: Home / Self Care | Payer: BC Managed Care – PPO | Attending: Emergency Medicine | Admitting: Emergency Medicine

## 2013-07-20 ENCOUNTER — Encounter (HOSPITAL_COMMUNITY): Payer: Self-pay | Admitting: Emergency Medicine

## 2013-07-20 ENCOUNTER — Emergency Department (HOSPITAL_COMMUNITY): Payer: BC Managed Care – PPO

## 2013-07-20 DIAGNOSIS — Z860100 Personal history of colon polyps, unspecified: Secondary | ICD-10-CM | POA: Insufficient documentation

## 2013-07-20 DIAGNOSIS — R109 Unspecified abdominal pain: Secondary | ICD-10-CM

## 2013-07-20 DIAGNOSIS — K7460 Unspecified cirrhosis of liver: Secondary | ICD-10-CM | POA: Insufficient documentation

## 2013-07-20 DIAGNOSIS — K766 Portal hypertension: Secondary | ICD-10-CM | POA: Insufficient documentation

## 2013-07-20 DIAGNOSIS — K921 Melena: Secondary | ICD-10-CM | POA: Insufficient documentation

## 2013-07-20 DIAGNOSIS — Z3202 Encounter for pregnancy test, result negative: Secondary | ICD-10-CM | POA: Insufficient documentation

## 2013-07-20 DIAGNOSIS — R197 Diarrhea, unspecified: Secondary | ICD-10-CM | POA: Insufficient documentation

## 2013-07-20 DIAGNOSIS — Z79899 Other long term (current) drug therapy: Secondary | ICD-10-CM | POA: Insufficient documentation

## 2013-07-20 DIAGNOSIS — G8929 Other chronic pain: Secondary | ICD-10-CM | POA: Insufficient documentation

## 2013-07-20 DIAGNOSIS — R42 Dizziness and giddiness: Secondary | ICD-10-CM | POA: Insufficient documentation

## 2013-07-20 DIAGNOSIS — M545 Low back pain, unspecified: Secondary | ICD-10-CM | POA: Insufficient documentation

## 2013-07-20 DIAGNOSIS — R21 Rash and other nonspecific skin eruption: Secondary | ICD-10-CM | POA: Insufficient documentation

## 2013-07-20 DIAGNOSIS — Z862 Personal history of diseases of the blood and blood-forming organs and certain disorders involving the immune mechanism: Secondary | ICD-10-CM | POA: Insufficient documentation

## 2013-07-20 DIAGNOSIS — Z87891 Personal history of nicotine dependence: Secondary | ICD-10-CM | POA: Insufficient documentation

## 2013-07-20 LAB — CBC WITH DIFFERENTIAL/PLATELET
Basophils Absolute: 0 10*3/uL (ref 0.0–0.1)
Basophils Relative: 1 % (ref 0–1)
Eosinophils Relative: 2 % (ref 0–5)
HCT: 33.7 % — ABNORMAL LOW (ref 36.0–46.0)
Hemoglobin: 11.3 g/dL — ABNORMAL LOW (ref 12.0–15.0)
MCH: 30.1 pg (ref 26.0–34.0)
MCHC: 33.5 g/dL (ref 30.0–36.0)
MCV: 89.9 fL (ref 78.0–100.0)
Monocytes Absolute: 0.5 10*3/uL (ref 0.1–1.0)
Monocytes Relative: 9 % (ref 3–12)
Neutro Abs: 3.5 10*3/uL (ref 1.7–7.7)
RDW: 15.5 % (ref 11.5–15.5)

## 2013-07-20 LAB — POCT PREGNANCY, URINE: Preg Test, Ur: NEGATIVE

## 2013-07-20 LAB — PROTIME-INR
INR: 1.24 (ref 0.00–1.49)
Prothrombin Time: 15.3 seconds — ABNORMAL HIGH (ref 11.6–15.2)

## 2013-07-20 LAB — COMPREHENSIVE METABOLIC PANEL
AST: 36 U/L (ref 0–37)
Albumin: 3.4 g/dL — ABNORMAL LOW (ref 3.5–5.2)
BUN: 17 mg/dL (ref 6–23)
Calcium: 9.1 mg/dL (ref 8.4–10.5)
Chloride: 105 mEq/L (ref 96–112)
Creatinine, Ser: 0.64 mg/dL (ref 0.50–1.10)
GFR calc non Af Amer: >90 mL/min (ref >90)
Total Bilirubin: 1.9 mg/dL — ABNORMAL HIGH (ref 0.3–1.2)

## 2013-07-20 LAB — URINALYSIS, ROUTINE W REFLEX MICROSCOPIC
Bilirubin Urine: NEGATIVE
Ketones, ur: NEGATIVE mg/dL
Specific Gravity, Urine: 1.021 (ref 1.005–1.030)
pH: 5 (ref 5.0–8.0)

## 2013-07-20 LAB — TYPE AND SCREEN: ABO/RH(D): O POS

## 2013-07-20 LAB — ABO/RH: ABO/RH(D): O POS

## 2013-07-20 LAB — LIPASE, BLOOD: Lipase: 29 U/L (ref 11–59)

## 2013-07-20 LAB — URINE MICROSCOPIC-ADD ON

## 2013-07-20 LAB — CG4 I-STAT (LACTIC ACID): Lactic Acid, Venous: 1.61 mmol/L (ref 0.5–2.2)

## 2013-07-20 MED ORDER — SODIUM CHLORIDE 0.9 % IV BOLUS (SEPSIS)
1000.0000 mL | Freq: Once | INTRAVENOUS | Status: AC
  Administered 2013-07-20: 1000 mL via INTRAVENOUS

## 2013-07-20 MED ORDER — IOHEXOL 300 MG/ML  SOLN: 25.0000 mL | Freq: Once | INTRAMUSCULAR | Status: DC | PRN

## 2013-07-20 MED ORDER — IOHEXOL 300 MG/ML  SOLN
100.0000 mL | Freq: Once | INTRAMUSCULAR | Status: AC | PRN
  Administered 2013-07-20: 100 mL via INTRAVENOUS

## 2013-07-20 NOTE — ED Notes (Signed)
Called CT, Pt finished  contrast

## 2013-07-20 NOTE — ED Notes (Signed)
Pt here for abd pain and rectal bleeding, onset Wednesday night and sent here by urgent care, reports partial colectomy and sts after she uses the restroom she has low back pain.

## 2013-07-20 NOTE — ED Notes (Signed)
PA at bedside.

## 2013-07-20 NOTE — ED Notes (Signed)
Pt. Ambulated to rest room

## 2013-07-20 NOTE — ED Provider Notes (Signed)
CSN: [id number]     Arrival date & time 07/20/13  1116 History   First MD Initiated Contact with Patient 07/20/13 1136     Chief Complaint  Patient presents with  . Abdominal Pain  . Rectal Bleeding   (Consider location/radiation/quality/duration/timing/severity/associated sxs/prior Treatment) The history is provided by the patient. No language interpreter was used.  Hannah Khan is a 57 year old female with past medical history of hypertension, fibromyalgia, ITP, PPD presenting to emergency department with abdominal pain and bright red blood in her stools. Patient reports that the abdominal pain has been ongoing since Wednesday-reported that on Tuesday night the discomfort was localized to the right side of her abdomen described as a tenderness, soreness, cramping sensation that is now generalized to the rest of her abdomen. Reported that the discomfort is all over her abdomen described as a cramping sensation. Patient reports that the discomfort worsens with walking and motion, nothing makes the pain better-reported that she has used nothing for the discomfort. Patient reported that when she woke up this morning she noticed blood when she wiped after her bowel movement. Patient reported that when she arrived at the walk in clinic at [facility] patient had a bowel movement and noted bright red blood dripping. Patient reported that she noticed the rash to her abdomen, doesn't know where it came from reported that its red, denied pain. Patient reported that she had a colectomy performed in 2011-colonoscopy was performed identified polyps were precancerous. Patient reported that she had a colonoscopy performed last year, stated that she does not have to be seen for the next 5 years. Patient reports that she has chronic back pain- reported that over the past month with low back pain has been getting progressively worse, reports that when she has bowel movements the back pain worsens, described as a burning,  searing pain that goes across the lower back. Denied fever, chills, nausea, vomiting, melena, NSAID use, chest pain, shortness of breath, difficulty breathing. PCP [facility]  Past Medical History  Diagnosis Date  . Sleep apnea   . Fibromyalgia   . Hx of colonic polyps   . HTN (hypertension)   . ITP (idiopathic thrombocytopenic purpura)    Past Surgical History  Procedure Laterality Date  . Colectomy  2011  . Cholecystectomy    . Knee arthroscopy      bilateral   Family History  Problem Relation Age of Onset  . Heart failure Mother   . Lung cancer Father    History  Substance Use Topics  . Smoking status: Former Smoker -- 1.00 packs/day for 3 years    Types: Cigarettes    Quit date: 09/19/1974  . Smokeless tobacco: Not on file  . Alcohol Use: No   OB History   Grav Para Term Preterm Abortions TAB SAB Ect Mult Living                 Review of Systems  Constitutional: Negative for fever and chills.  Respiratory: Negative for chest tightness and shortness of breath.   Cardiovascular: Negative for chest pain.  Gastrointestinal: Positive for abdominal pain, diarrhea and blood in stool. Negative for nausea and vomiting.  Musculoskeletal: Positive for back pain.  Neurological: Positive for dizziness (Patient has history of benign positional vertigo). Negative for weakness.  All other systems reviewed and are negative.    Allergies  Celecoxib; Prednisone; and Statins  Home Medications   Current Outpatient Rx  Name  Route  Sig  Dispense  Refill  . Cholecalciferol (VITAMIN D-3) 5000 UNITS TABS   Oral   Take 5,000 Units by mouth daily.          . hydrochlorothiazide (HYDRODIURIL) 12.5 MG tablet   Oral   Take 12.5 mg by mouth daily.         . methocarbamol (ROBAXIN) 500 MG tablet   Oral   Take 500 mg by mouth 4 (four) times daily as needed (for muscle spasms).          . Multiple Vitamins-Minerals (MULTIVITAMIN PO)   Oral   Take 1 tablet by mouth  daily.          BP 135/45  Pulse 93  Temp(Src) 98.8 F (37.1 C) (Oral)  Resp 12  Ht 5' 7"  (1.702 m)  Wt 345 lb (156.491 kg)  BMI 54.02 kg/m2  SpO2 97% Physical Exam  Nursing note and vitals reviewed. Constitutional: She is oriented to person, place, and time. She appears well-developed and well-nourished. No distress.  HENT:  Head: Normocephalic and atraumatic.  Mouth/Throat: No oropharyngeal exudate.  Dry mucous membranes  Eyes: Conjunctivae and EOM are normal. Pupils are equal, round, and reactive to light. Right eye exhibits no discharge. Left eye exhibits no discharge.  Neck: Normal range of motion. Neck supple.  Cardiovascular: Normal rate, regular rhythm and normal heart sounds.  Exam reveals no friction rub.   No murmur heard. Pulmonary/Chest: Effort normal and breath sounds normal. No respiratory distress. She has no wheezes. She has no rales.  Abdominal: Soft. Bowel sounds are normal. She exhibits no distension. There is tenderness. There is no guarding.  Positive Murphy's sign Generalized tenderness the abdomen Hard upon palpation Obese  Petechial rash localized to the right lower quadrant of the abdomen-negative pain upon palpation, negative warmth upon palpation.  Genitourinary:  Rectal exam: Negative inflammation, swelling, erythema, lesions, sores, hemorrhoids noted to the anus. Negative pain upon palpation. Negative masses palpated in the rectum. Sphincter tone strong. Negative bright red blood on the glove. Brown stools on glove.  Musculoskeletal: Normal range of motion.  Neurological: She is alert and oriented to person, place, and time. She exhibits normal muscle tone. Coordination normal.  Skin: Skin is warm and dry. No rash noted. She is not diaphoretic. No erythema.  Psychiatric: She has a normal mood and affect. Her behavior is normal. Thought content normal.    ED Course  Procedures (including critical care time)  4:59 PM This provider spoke with the  atietn in great detail regarding labs and imaging results. Discussed with patient that will most likely try to admit regarding new onset cirrhosis and lowered platelets.   5:11 PM Spoke with Dr. Aileen Fass from [facility]. Discussed history, case, labs, imaging results with physician. As per [facility] - did not recommend patient to be admitted to the hospital, reported that patient does not meet the criteria. Reported that at this time patient does not seem to be bleeding at the moment. Reported that since Hgb has not dropped, not hypotensive, not febrile, negative fecal occult patient okay to be discharged home. Recommended that patient follow-up with gastroenterologist and PCP. Discussed this with Dr. Alyson Locket who agreed and understood.   5:57 PM This provider spoke with the patient regarding discussion with Internal Medicine physician regarding patient not meeting the criteria for admission. Patient understood and agreed.   7:01 PM Had a long discussion with patient regarding plan for discharge. Educated patient on what symptoms to watch out for and if  there is any sign of bleeding patient is to report back to the ED immediately. Patient and husband understood and agreed.   Labs Review Labs Reviewed  CBC WITH DIFFERENTIAL - Abnormal; Notable for the following:    RBC 3.75 (*)    Hemoglobin 11.3 (*)    HCT 33.7 (*)    All other components within normal limits  COMPREHENSIVE METABOLIC PANEL - Abnormal; Notable for the following:    Glucose, Bld 134 (*)    Albumin 3.4 (*)    Total Bilirubin 1.9 (*)    All other components within normal limits  URINALYSIS, ROUTINE W REFLEX MICROSCOPIC - Abnormal; Notable for the following:    Color, Urine AMBER (*)    APPearance TURBID (*)    Hgb urine dipstick LARGE (*)    Protein, ur 30 (*)    Leukocytes, UA TRACE (*)    All other components within normal limits  PROTIME-INR - Abnormal; Notable for the following:    Prothrombin Time 15.3  (*)    All other components within normal limits  URINE MICROSCOPIC-ADD ON - Abnormal; Notable for the following:    Squamous Epithelial / LPF MANY (*)    Bacteria, UA FEW (*)    All other components within normal limits  LIPASE, BLOOD  PLATELET COUNT  APTT  CG4 I-STAT (LACTIC ACID)  OCCULT BLOOD, POC DEVICE  POCT PREGNANCY, URINE  TYPE AND SCREEN  ABO/RH   Imaging Review Ct Abdomen Pelvis W Contrast  07/20/2013   CLINICAL DATA:  Right-sided abdominal pain, blood in stool and diarrhea. Prior partial colectomy  EXAM: CT ABDOMEN AND PELVIS WITH CONTRAST  TECHNIQUE: Multidetector CT imaging of the abdomen and pelvis was performed using the standard protocol following bolus administration of intravenous contrast.  CONTRAST:  172m OMNIPAQUE IOHEXOL 300 MG/ML  SOLN  COMPARISON:  03/10/2010  FINDINGS: Exam detail is suboptimal due to patient body habitus.  Curvilinear bilateral lower lobe scarring is reidentified.  The liver is nodular in contour and hypodense. No measurable focal mass lesion. Splenomegaly is identified, 20.6 cm in maximal length image 32. Bilateral upper quadrant ascites is present. The liver contour is mildly nodular. No intra or extrahepatic ductal dilatation. Cholecystectomy clips are noted. Pancreas, adrenal glands are unremarkable. Ill-defined 1.5 cm left lower renal pole and 1 cm right upper renal pole representative cortical hypodense lesions are identified, unchanged from the prior exam allowing for differences in technique. Evidence of portal hypertension with splenorenal collateral vessels.  No bowel wall thickening or focal segmental dilatation. Uterus and ovaries are normal. Colonic diverticuli noted without evidence for diverticulitis. Changes of probable right hemicolectomy are identified. No lymphadenopathy or free air. No acute osseous finding. Subcutaneous edema is noted within the lower abdomen abdominal wall tissues.  IMPRESSION: Imaging findings suggestive of  cirrhosis with portal hypertension and splenomegaly.  Ascites.  No acute abnormality identified.   Electronically Signed   By: GConchita ParisM.D.   On: 07/20/2013 16:02    EKG Interpretation   None       MDM   1. Cirrhosis   2. Portal hypertension   3. Abdominal pain     Patient presenting to emergency department with abdominal pain that has been ongoing since Tuesday, became progressively worse with 2 episodes of bright red bloody stools today. Patient was seen by [facility]-was referred to come to emergency department to be assessed. Patient has history of precancerous polyps, with colectomy performed in 2011. Alert and oriented.  Heart rate and rhythm normal. Pulses palpable and strong, radial and DP bilaterally. Lungs clear to auscultation bilaterally. Bowel sounds normoactive in all 4 quadrants. Obese. Abdomen hard upon palpation, discomfort generalized. Positive Murphy's sign. Petechial rash to the right lower quadrant negative signs of surrounding cellulitis. Rectal exam unremarkable, negative blood on glove, brown stools on glove. Fecal occult negative. Lactic acid negative elevation. CBC negative elevation white blood cell count. Hemoglobin mildly low-11.3, hematocrit 33.7. Platelets clumped on smear and P. are to be decreased-will repeat a platelet count. CMP negative findings. Lipase negative elevation. Urine pregnancy negative. Urinalysis noted large hemoglobin, trace of leukocytes many squamous cells identified-negative clean catch-negative pyuria, and negative nitrites-negative infection identified. Prothrombin time elevated, 15.3. INR and APTT within normal limits. Platelet count-platelet clumps noted on smear, count appears to be decreased. CT abdomen and pelvis with contrast noted cirrhosis of the liver with portal hypertension and splenomegaly with ascites. Splenomegaly due to ITP and platelet break down.  Discussed history, case, labs, imaging with Dr. Aileen Fass -  [facility] - reported that patient does not meet criteria for admission. Physician reported that since fecal occult came back negative, patient not hypotensive, Hgb fine, Hct fine, hemodynamically stable and patient not currently bleeding physician reported that patient can be sent home with close follow-up with PCP and gastroenterology.  Discussed imaging and labs with patient in great detail. Educated patient on what cirrhosis is. Patient stable, afebrile. Discharged patient. Referred patient to PCP, gastroenterology and oncologist. Discussed with patient that she will need a scope to be performed regarding esophageal and rectal varices. Discussed with patient to closely monitor symptoms and if symptoms are to worsen or change to report back to the ED - educated patient on what to watch out for - strict return instructions given.  Patient agreed to plan of care, understood, all questions answered.   Jamse Mead, PA-C 07/22/13 1331

## 2013-07-23 ENCOUNTER — Observation Stay (HOSPITAL_COMMUNITY)
Admission: EM | Admit: 2013-07-23 | Discharge: 2013-07-24 | Disposition: A | Discharge status: Home / Self Care | Payer: BC Managed Care – PPO | Attending: Internal Medicine | Admitting: Internal Medicine

## 2013-07-23 ENCOUNTER — Observation Stay (HOSPITAL_COMMUNITY): Payer: BC Managed Care – PPO

## 2013-07-23 ENCOUNTER — Encounter (HOSPITAL_COMMUNITY): Payer: Self-pay | Admitting: Emergency Medicine

## 2013-07-23 DIAGNOSIS — R0600 Dyspnea, unspecified: Secondary | ICD-10-CM | POA: Diagnosis present

## 2013-07-23 DIAGNOSIS — D61818 Other pancytopenia: Secondary | ICD-10-CM | POA: Insufficient documentation

## 2013-07-23 DIAGNOSIS — R188 Other ascites: Secondary | ICD-10-CM | POA: Insufficient documentation

## 2013-07-23 DIAGNOSIS — K766 Portal hypertension: Secondary | ICD-10-CM | POA: Insufficient documentation

## 2013-07-23 DIAGNOSIS — R0989 Other specified symptoms and signs involving the circulatory and respiratory systems: Secondary | ICD-10-CM | POA: Insufficient documentation

## 2013-07-23 DIAGNOSIS — K7460 Unspecified cirrhosis of liver: Principal | ICD-10-CM | POA: Insufficient documentation

## 2013-07-23 DIAGNOSIS — D696 Thrombocytopenia, unspecified: Secondary | ICD-10-CM | POA: Insufficient documentation

## 2013-07-23 DIAGNOSIS — R109 Unspecified abdominal pain: Secondary | ICD-10-CM | POA: Diagnosis present

## 2013-07-23 DIAGNOSIS — R142 Eructation: Secondary | ICD-10-CM | POA: Insufficient documentation

## 2013-07-23 DIAGNOSIS — D649 Anemia, unspecified: Secondary | ICD-10-CM | POA: Diagnosis present

## 2013-07-23 DIAGNOSIS — R7309 Other abnormal glucose: Secondary | ICD-10-CM

## 2013-07-23 DIAGNOSIS — R0609 Other forms of dyspnea: Secondary | ICD-10-CM | POA: Insufficient documentation

## 2013-07-23 DIAGNOSIS — I10 Essential (primary) hypertension: Secondary | ICD-10-CM

## 2013-07-23 DIAGNOSIS — R141 Gas pain: Secondary | ICD-10-CM | POA: Insufficient documentation

## 2013-07-23 DIAGNOSIS — J309 Allergic rhinitis, unspecified: Secondary | ICD-10-CM

## 2013-07-23 DIAGNOSIS — K7469 Other cirrhosis of liver: Secondary | ICD-10-CM | POA: Diagnosis present

## 2013-07-23 DIAGNOSIS — G4733 Obstructive sleep apnea (adult) (pediatric): Secondary | ICD-10-CM

## 2013-07-23 DIAGNOSIS — D6949 Other primary thrombocytopenia: Secondary | ICD-10-CM

## 2013-07-23 LAB — CBC WITH DIFFERENTIAL/PLATELET
Basophils Absolute: 0 10*3/uL (ref 0.0–0.1)
Basophils Relative: 1 % (ref 0–1)
Eosinophils Absolute: 0.1 10*3/uL (ref 0.0–0.7)
Eosinophils Relative: 2 % (ref 0–5)
HCT: 31.9 % — ABNORMAL LOW (ref 36.0–46.0)
Hemoglobin: 10.9 g/dL — ABNORMAL LOW (ref 12.0–15.0)
Lymphocytes Relative: 25 % (ref 12–46)
Lymphs Abs: 0.9 10*3/uL (ref 0.7–4.0)
MCH: 29.9 pg (ref 26.0–34.0)
MCHC: 34.2 g/dL (ref 30.0–36.0)
MCV: 87.4 fL (ref 78.0–100.0)
Monocytes Absolute: 0.3 10*3/uL (ref 0.1–1.0)
Monocytes Relative: 8 % (ref 3–12)
Neutro Abs: 2.3 10*3/uL (ref 1.7–7.7)
Neutrophils Relative %: 65 % (ref 43–77)
Platelets: DECREASED 10*3/uL (ref 150–400)
RBC: 3.65 MIL/uL — ABNORMAL LOW (ref 3.87–5.11)
RDW: 15.1 % (ref 11.5–15.5)
WBC: 3.5 10*3/uL — ABNORMAL LOW (ref 4.0–10.5)

## 2013-07-23 LAB — URINALYSIS, ROUTINE W REFLEX MICROSCOPIC
Bilirubin Urine: NEGATIVE
Glucose, UA: NEGATIVE mg/dL
Ketones, ur: NEGATIVE mg/dL
Leukocytes, UA: NEGATIVE
Nitrite: NEGATIVE
Protein, ur: NEGATIVE mg/dL
Specific Gravity, Urine: 1.009 (ref 1.005–1.030)
Urobilinogen, UA: 0.2 mg/dL (ref 0.0–1.0)
pH: 5.5 (ref 5.0–8.0)

## 2013-07-23 LAB — COMPREHENSIVE METABOLIC PANEL
ALT: 20 U/L (ref 0–35)
AST: 39 U/L — ABNORMAL HIGH (ref 0–37)
Albumin: 3.7 g/dL (ref 3.5–5.2)
Alkaline Phosphatase: 76 U/L (ref 39–117)
BUN: 10 mg/dL (ref 6–23)
CO2: 24 mEq/L (ref 19–32)
Calcium: 9.2 mg/dL (ref 8.4–10.5)
Chloride: 105 mEq/L (ref 96–112)
Creatinine, Ser: 0.56 mg/dL (ref 0.50–1.10)
GFR calc Af Amer: >90 mL/min (ref >90)
GFR calc non Af Amer: >90 mL/min (ref >90)
Glucose, Bld: 84 mg/dL (ref 70–99)
Potassium: 3.6 mEq/L (ref 3.5–5.1)
Sodium: 140 mEq/L (ref 135–145)
Total Bilirubin: 1.8 mg/dL — ABNORMAL HIGH (ref 0.3–1.2)
Total Protein: 7.6 g/dL (ref 6.0–8.3)

## 2013-07-23 LAB — URINE MICROSCOPIC-ADD ON

## 2013-07-23 LAB — GLUCOSE, CAPILLARY: Glucose-Capillary: 140 mg/dL — ABNORMAL HIGH (ref 70–99)

## 2013-07-23 LAB — LIPASE, BLOOD: Lipase: 27 U/L (ref 11–59)

## 2013-07-23 MED ORDER — OXYCODONE HCL 5 MG PO TABS: 5.0000 mg | ORAL_TABLET | Freq: Four times a day (QID) | ORAL | Status: DC | PRN

## 2013-07-23 MED ORDER — ACETAMINOPHEN 325 MG PO TABS: 650.0000 mg | ORAL_TABLET | Freq: Four times a day (QID) | ORAL | Status: DC | PRN

## 2013-07-23 MED ORDER — FUROSEMIDE 10 MG/ML IJ SOLN
40.0000 mg | Freq: Two times a day (BID) | INTRAMUSCULAR | Status: AC
  Administered 2013-07-23 – 2013-07-24 (×2): 40 mg via INTRAVENOUS
  Filled 2013-07-23 (×2): qty 4

## 2013-07-23 MED ORDER — METHOCARBAMOL 500 MG PO TABS
500.0000 mg | ORAL_TABLET | Freq: Four times a day (QID) | ORAL | Status: DC | PRN
  Filled 2013-07-23: qty 1

## 2013-07-23 MED ORDER — ONDANSETRON HCL 4 MG PO TABS: 4.0000 mg | ORAL_TABLET | Freq: Four times a day (QID) | ORAL | Status: DC | PRN

## 2013-07-23 MED ORDER — ACETAMINOPHEN 650 MG RE SUPP: 650.0000 mg | Freq: Four times a day (QID) | RECTAL | Status: DC | PRN

## 2013-07-23 MED ORDER — ONDANSETRON HCL 4 MG/2ML IJ SOLN: 4.0000 mg | Freq: Four times a day (QID) | INTRAMUSCULAR | Status: DC | PRN

## 2013-07-23 MED ORDER — VITAMIN D-3 125 MCG (5000 UT) PO TABS: 5000.0000 [IU] | ORAL_TABLET | Freq: Every day | ORAL | Status: DC

## 2013-07-23 MED ORDER — ALBUTEROL SULFATE (5 MG/ML) 0.5% IN NEBU: 2.5000 mg | INHALATION_SOLUTION | RESPIRATORY_TRACT | Status: DC | PRN

## 2013-07-23 MED ORDER — VITAMIN D3 25 MCG (1000 UNIT) PO TABS
5000.0000 [IU] | ORAL_TABLET | Freq: Every day | ORAL | Status: DC
  Administered 2013-07-24: 10:00:00 5000 [IU] via ORAL
  Filled 2013-07-23 (×2): qty 5

## 2013-07-23 MED ORDER — MORPHINE SULFATE 2 MG/ML IJ SOLN: 1.0000 mg | INTRAMUSCULAR | Status: DC | PRN

## 2013-07-23 NOTE — ED Provider Notes (Signed)
CSN: [id number]     Arrival date & time 07/23/13  1256 History   First MD Initiated Contact with Patient 07/23/13 1334     Chief Complaint  Patient presents with  . Abdominal Pain   HPI  [name] is a 57 yo morbidly obese female with newly dx cirrhosis, HTN, DM, and ITP who complains of continuing abdominal pain and SOB. She was evaluated on 11/1 in the ED for similar symptoms. Her pain has continued. Describes pain as pressure, now worse on LLQ vs RLQ. Pain is a 6/10, worse with palpation and movement. No nausea or vomiting, fevers or chills. Pt has chronic loose bowels due to colectomy. NO blood in her stools. The petechial rash on her abdomen has gotten larger as well since the weekend. Patient saw her PCP yesterday who ran some more tests and started her on Keflex. He called her this morning and told her to come to the ED to get have 'the fluid in her belly drained and tested.'   Past Medical History  Diagnosis Date  . Sleep apnea   . Fibromyalgia   . Hx of colonic polyps   . HTN (hypertension)   . ITP (idiopathic thrombocytopenic purpura)    Past Surgical History  Procedure Laterality Date  . Colectomy  2011  . Cholecystectomy    . Knee arthroscopy      bilateral   Family History  Problem Relation Age of Onset  . Heart failure Mother   . Lung cancer Father    History  Substance Use Topics  . Smoking status: Former Smoker -- 1.00 packs/day for 3 years    Types: Cigarettes    Quit date: 09/19/1974  . Smokeless tobacco: Not on file  . Alcohol Use: No   OB History   Grav Para Term Preterm Abortions TAB SAB Ect Mult Living                 Review of Systems All other systems negative except as documented in the HPI. All pertinent positives and negatives as reviewed in the HPI.  Allergies  Celecoxib; Prednisone; and Statins  Home Medications   Current Outpatient Rx  Name  Route  Sig  Dispense  Refill  . cephALEXin (KEFLEX) 500 MG capsule   Oral   Take 500  mg by mouth 4 (four) times daily. Started on 07-22-13 for 10 day therapy. Pt's on day 2 of therapy         . Cholecalciferol (VITAMIN D-3) 5000 UNITS TABS   Oral   Take 5,000 Units by mouth daily.          Marland Kitchen co-enzyme Q-10 30 MG capsule   Oral   Take 30 mg by mouth daily.         . hydrochlorothiazide (HYDRODIURIL) 12.5 MG tablet   Oral   Take 12.5 mg by mouth daily.         . methocarbamol (ROBAXIN) 500 MG tablet   Oral   Take 500 mg by mouth 4 (four) times daily as needed (for muscle spasms).          . Multiple Vitamins-Minerals (MULTIVITAMIN PO)   Oral   Take 1 tablet by mouth daily.          BP 169/92  Pulse 89  Temp(Src) 98.4 F (36.9 C) (Oral)  Resp 24  Ht 5' 7"  (1.702 m)  Wt 348 lb 12.8 oz (158.215 kg)  BMI 54.62 kg/m2  SpO2 100% Physical  Exam  Constitutional: She appears well-developed and well-nourished.  Cardiovascular: Normal rate, regular rhythm and normal heart sounds.   Pulmonary/Chest: Effort normal and breath sounds normal.  Abdominal: She exhibits distension, fluid wave and ascites. There is splenomegaly and hepatomegaly. There is tenderness in the right lower quadrant, left upper quadrant and left lower quadrant. There is no rebound, no guarding and no CVA tenderness.  Musculoskeletal:       Right ankle: She exhibits abnormal pulse.       Left ankle: She exhibits abnormal pulse.       Right lower leg: She exhibits edema. She exhibits no tenderness.       Left lower leg: She exhibits edema. She exhibits no tenderness.  Lymphadenopathy:    She has no cervical adenopathy.  Skin: Skin is warm.       ED Course  Procedures (including critical care time) The patient will be admitted due to her lab and CT findings. The patient is stable. I spoke with [facility] about the patient.   Brent General, PA-C 07/24/13 1437

## 2013-07-23 NOTE — H&P (Signed)
[facility] History and Physical  Hannah Khan MOL:[id number] DOB: March 04, 1956 DOA: 07/23/2013  Referring physician: EDP PCP: Lujean Amel, MD  Outpatient Specialists:  1. Hematology: Dr. Zola Button. 2. GI: Dr. [name]. 3. Pulmonology: Dr. Baird Lyons. 4. Surgery: Dr. Redmond Pulling.  Chief Complaint: Abdominal pain.  HPI: Hannah Khan is a 57 y.o. female with history of borderline DM, HTN, thrombocytopenia (autoimmune), status post partial colectomy due to tubular adenoma, OSA on nightly CPAP, presented to the ED for the second time in 4 days with worsening abdominal pain. Patient gives history of baseline 3-4 soft/loose stools since partial colectomy. She gives 6 weeks history of urgency to stool following oral intake. 4 weeks history of intermittent abdominal bloating and pain which have got worse in the last 1 week. Pain was initially right-sided now is more diffuse, constant, rated at 5-6/10 in intensity was not doing anything in increases to 9/10 with movement, sneezing or coughing. Denies nausea, vomiting, fever or chills. Appetite is decreased. Consistency and frequency of stools has remained the same. She noticed couple of drops of blood per rectum 3 days ago but none since then. No melena. She was seen in the ED on 11/1 and CT abdomen and pelvis showed new diagnosis of cirrhosis of liver, portal hypertension, splenomegaly and ascites. Patient was then seen by her PCP who noticed a rash on her abdomen and started her on oral Keflex. Some labs were drawn during that visit on Thursday 2 which apparently showed platelet count of 37K. She was advised to come to the ED for further evaluation. She states that she has had hepatitis testing done twice approximately 2 years ago and recently which were negative. She denies alcohol abuse. Hospitalist admission requested.  Review of Systems: All systems reviewed and apart from history of presenting illness, worsening DOE. Denies chest pain  or cough. She has chronic leg edema left greater than the right. All other systems negative.  Past Medical History  Diagnosis Date  . Sleep apnea   . Fibromyalgia   . Hx of colonic polyps   . HTN (hypertension)   . ITP (idiopathic thrombocytopenic purpura)    Past Surgical History  Procedure Laterality Date  . Colectomy  2011  . Cholecystectomy    . Knee arthroscopy      bilateral   Social History:  reports that she quit smoking about 38 years ago. Her smoking use included Cigarettes. She has a 3 pack-year smoking history. She does not have any smokeless tobacco history on file. She reports that she does not drink alcohol or use illicit drugs. Married. Independent of activities of daily living.  Allergies  Allergen Reactions  . Celecoxib     "speech slurred"  . Prednisone     Rapid HR  . Statins     aggrevate fibromyalgia    Family History  Problem Relation Age of Onset  . Heart failure Mother   . Lung cancer Father     Prior to Admission medications   Medication Sig Start Date End Date Taking? Authorizing Provider  cephALEXin (KEFLEX) 500 MG capsule Take 500 mg by mouth 4 (four) times daily. Started on 07-22-13 for 10 day therapy. Pt's on day 2 of therapy   Yes Historical Provider, MD  Cholecalciferol (VITAMIN D-3) 5000 UNITS TABS Take 5,000 Units by mouth daily.    Yes Historical Provider, MD  co-enzyme Q-10 30 MG capsule Take 30 mg by mouth daily.   Yes Historical Provider, MD  hydrochlorothiazide (HYDRODIURIL)  12.5 MG tablet Take 12.5 mg by mouth daily.   Yes Historical Provider, MD  methocarbamol (ROBAXIN) 500 MG tablet Take 500 mg by mouth 4 (four) times daily as needed (for muscle spasms).    Yes Historical Provider, MD  Multiple Vitamins-Minerals (MULTIVITAMIN PO) Take 1 tablet by mouth daily.   Yes Historical Provider, MD   Physical Exam: Filed Vitals:   07/23/13 1301 07/23/13 1515 07/23/13 1657  BP: 169/92 134/54 152/84  Pulse: 89 79 89  Temp: 98.4 F (36.9  C) 98 F (36.7 C)   TempSrc: Oral Oral   Resp: 24 20   Height: 5' 7"  (1.702 m)    Weight: 158.215 kg (348 lb 12.8 oz)    SpO2: 100% 100% 98%     General exam: Moderately built and morbidly obese female patient, lying comfortably supine on the gurney in no obvious distress. Does not look septic or toxic.  Head, eyes and ENT: Nontraumatic and normocephalic. Pupils equally reacting to light and accommodation. Oral mucosa moist.  Neck: Supple. No JVD, carotid bruit or thyromegaly.  Lymphatics: No lymphadenopathy.  Respiratory system: Clear to auscultation. No increased work of breathing.  Cardiovascular system: S1 and S2 heard, RRR. No JVD, murmurs, gallops, clicks. Pitting 1+ bilateral leg edema left > right.  Gastrointestinal system: Abdomen is distended but not sure how much of it is due to obesity. Not tense. No fluid thrill. She states that this is definitely worse than the usual. Petechial rash in stria-like pattern over lower abdomen below the umbilicus. Diffuse mild tenderness without guarding, rigidity or rebound. Normal bowel sounds heard. No organomegaly or masses appreciated.  Central nervous system: Alert and oriented. No focal neurological deficits.  Extremities: Symmetric 5 x 5 power. Peripheral pulses symmetrically felt.   Skin: As above  Musculoskeletal system: Negative exam.  Psychiatry: Pleasant and cooperative.   Labs on Admission:  Basic Metabolic Panel:  Recent Labs Lab 07/20/13 1200 07/23/13 1523  NA 139 140  K 3.9 3.6  CL 105 105  CO2 24 24  GLUCOSE 134* 84  BUN 17 10  CREATININE 0.64 0.56  CALCIUM 9.1 9.2   Liver Function Tests:  Recent Labs Lab 07/20/13 1200 07/23/13 1523  AST 36 39*  ALT 21 20  ALKPHOS 74 76  BILITOT 1.9* 1.8*  PROT 7.1 7.6  ALBUMIN 3.4* 3.7    Recent Labs Lab 07/20/13 1200 07/23/13 1523  LIPASE 29 27   No results found for this basename: AMMONIA,  in the last 168 hours CBC:  Recent Labs Lab  07/20/13 1200 07/20/13 1336 07/23/13 1523  WBC 5.4  --  3.5*  NEUTROABS 3.5  --  2.3  HGB 11.3*  --  10.9*  HCT 33.7*  --  31.9*  MCV 89.9  --  87.4  PLT PLATELET CLUMPS NOTED ON SMEAR, COUNT APPEARS DECREASED PLATELET CLUMPS NOTED ON SMEAR, COUNT APPEARS DECREASED PLATELET CLUMPS NOTED ON SMEAR, COUNT APPEARS DECREASED   Cardiac Enzymes: No results found for this basename: CKTOTAL, CKMB, CKMBINDEX, TROPONINI,  in the last 168 hours  BNP (last 3 results) No results found for this basename: PROBNP,  in the last 8760 hours CBG: No results found for this basename: GLUCAP,  in the last 168 hours  Radiological Exams on Admission: No results found.  Assessment/Plan Principal Problem:   Abdominal pain Active Problems:   PRIMARY THROMBOCYTOPENIA UNSPECIFIED   OBSTRUCTIVE SLEEP APNEA   HYPERTENSION   DIABETES MELLITUS, BORDERLINE   Cirrhosis of liver, with portal  HTN & Acites, by CT Abdomen 07/20/13   Dyspnea   Abdominal pain - Unclear etiology. - LFTs only significant for minimally elevated AST and bilirubin. - Lipase normal - Followup urine microscopy - Recent CT abdomen showed findings suggestive of cirrhosis, portal hypertension, splenomegaly and ascites. There by SBP is a possibility. Patient would benefit from diagnostic paracentesis but platelets are very low. - Will temporarily hold Keflex to improve yielding a paracentesis done. - Consult with Dr. Alen Blew, primary hematologist in the morning to see if patient can proceed with it current numbers (last reported apparently 108. Testing she repeatedly has shown clumps). We'll also repeat CBC in the morning. - Dr. Teena Irani, GI consulted  Cirrhosis with portal hypertension, splenomegaly and ascites - No history of alcohol abuse and hepatitis testing apparently negative - Request outpatient PCP records - GI consulted.  Pancytopenia - Probably from cirrhosis. - Hematology consultation in a.m. Follow CBCs. No active bleeding  at this time.  Dyspnea/OSA - Probably secondary to OSA and abdominal distention. Clinically does not seem to be in heart failure. - Hold HCTZ and trial of IV Lasix. Continue nightly CPAP.  Abdominal rash - Likely petechial from thrombocytopenia. - Monitor.  Hypertension - Hold HCTZ while on IV Lasix. Reasonably controlled.  Borderline DM - Monitor CBGs.     Code Status: Full  Family Communication: Discussed with spouse at bedside  Disposition Plan: Home in medically stable   Time spent: 63 minutes  [name], MD, FACP, FHM. [facility] Pager [phone]  If 7PM-7AM, please contact night-coverage [email or web address] Password [email or web address] 07/23/2013, 5:10 PM

## 2013-07-23 NOTE — Progress Notes (Signed)
[facility] is PCP. Dr. Lujean Amel.

## 2013-07-23 NOTE — Progress Notes (Signed)
Attempted to call report. ED RN in another room at this time. Left number to call back.

## 2013-07-23 NOTE — ED Notes (Signed)
Pt was here on Saturday for abd pain, had an abdnormal ct scan and was sent home to f/u with her PCP. Pt continues to have more abd pain and swelling and fatigue and pcp wanted her to come back for possible admission.

## 2013-07-23 NOTE — ED Notes (Signed)
Contacted phlebotomy about lab wait time.

## 2013-07-23 NOTE — ED Notes (Signed)
Pt here dt continued syx from prior visit on 11/1. Pt reports continued lower 6/10 dull abdominal pain, worse with palpation. Pain on palpation to RLQ and LLQ. Pt has rash to right lower area that has progressively worsened since 11/1. Pt seen by PCP who ran additional labs and called pt today and instructed to come here for possible admission. Pt denies N/V/chills or diarrhea.

## 2013-07-23 NOTE — ED Notes (Signed)
Hospitalist at bedside 

## 2013-07-23 NOTE — Progress Notes (Signed)
Received report from [place], Therapist, sports.

## 2013-07-24 LAB — GLUCOSE, CAPILLARY
Glucose-Capillary: 141 mg/dL — ABNORMAL HIGH (ref 70–99)
Glucose-Capillary: 103 mg/dL — ABNORMAL HIGH (ref 70–99)

## 2013-07-24 LAB — CBC
HCT: 32.9 % — ABNORMAL LOW (ref 36.0–46.0)
Hemoglobin: 11.2 g/dL — ABNORMAL LOW (ref 12.0–15.0)
MCH: 29.9 pg (ref 26.0–34.0)
MCHC: 34 g/dL (ref 30.0–36.0)
RDW: 15.6 % — ABNORMAL HIGH (ref 11.5–15.5)
WBC: 4.1 10*3/uL (ref 4.0–10.5)

## 2013-07-24 LAB — HEPATITIS PANEL, ACUTE
HCV Ab: NEGATIVE
Hep A IgM: NONREACTIVE

## 2013-07-24 MED ORDER — FUROSEMIDE 40 MG PO TABS: 40.0000 mg | ORAL_TABLET | Freq: Every day | ORAL | Status: DC

## 2013-07-24 MED ORDER — SPIRONOLACTONE 25 MG PO TABS: 25.0000 mg | ORAL_TABLET | Freq: Every day | ORAL | Status: DC

## 2013-07-24 NOTE — Progress Notes (Signed)
Pt admitted to unit from ED. Pt is A&O, husband at bedside, & oriented to unit. Pt is currently resting comfortably in bed with call bell in reach. Will continue to monitor.

## 2013-07-24 NOTE — Care Management Note (Signed)
    Page 1 of 1   07/24/2013     5:03:23 PM   CARE MANAGEMENT NOTE 07/24/2013  Patient:  Hannah Khan, Hannah Khan   Account Number:  [id number]  Date Initiated:  07/24/2013  Documentation initiated by:  Tomi Bamberger  Subjective/Objective Assessment:   dx abd pain  admit as observation- lives spouse.     Action/Plan:   Anticipated DC Date:  07/24/2013   Anticipated DC Plan:  [place]  CM consult      Choice offered to / List presented to:             Status of service:  Completed, signed off Medicare Important Message given?   (If response is "NO", the following Medicare IM given date fields will be blank) Date Medicare IM given:   Date Additional Medicare IM given:    Discharge Disposition:  HOME/SELF CARE  Per UR Regulation:  Reviewed for med. necessity/level of care/duration of stay  If discussed at [place] of Stay Meetings, dates discussed:    Comments:  07/24/13 17:02 Tomi Bamberger RN, BSN [phone] no needs anticipated.

## 2013-07-24 NOTE — ED Provider Notes (Signed)
Medical screening examination/treatment/procedure(s) were conducted as a shared visit with non-physician practitioner(s) or resident and myself. I personally evaluated the patient during the encounter and agree with the findings and plan unless otherwise indicated.  I have personally reviewed any xrays and/ or EKG's with the provider and I agree with interpretation.  Worsening abdominal pain, distension since dc from ER recently. New diagnosis of cirrhosis. No risks or hx of hepatitis, no new medicines. Few episodes of blood in stool. No fevers. Pain epigastric/ RUQ. Petechia on right abdomen, Morbid obese, diffuse general abd pain worse RUQ, mmm. Plan for admission for further evaluation.  No results found.  Cirrhosis, Abdominal pain, Thrombocytopenia   Mariea Clonts, MD 07/24/13 [phone]

## 2013-07-24 NOTE — Discharge Summary (Signed)
[facility]                                                                                   Hannah Khan, is a 57 y.o. female  DOB 06/02/1956  MRN [id number].  Admission date:  07/23/2013  Admitting Physician  Modena Jansky, MD  Discharge Date:  07/24/2013   Primary MD  Lujean Amel, MD  Recommendations for primary care physician for things to follow:   No weight, he ascites and BMP closely adjust diuretic dose as needed   Admission Diagnosis  Cirrhosis [571.5] Cirrhosis of liver [571.5] Ascites [789.59] Pancytopenia [284.19] Abdominal pain [789.00]  Discharge Diagnosis  Cirrhosis of unclear etiology could be [place], ascites, thrombocytopenia likely due to portal hypertension  Principal Problem:   Abdominal pain Active Problems:   PRIMARY THROMBOCYTOPENIA UNSPECIFIED   OBSTRUCTIVE SLEEP APNEA   HYPERTENSION   DIABETES MELLITUS, BORDERLINE   Cirrhosis of liver, with portal HTN & Acites, by CT Abdomen 07/20/13   Dyspnea   Pancytopenia      Past Medical History  Diagnosis Date  . Sleep apnea   . Fibromyalgia   . Hx of colonic polyps   . HTN (hypertension)   . ITP (idiopathic thrombocytopenic purpura)     Past Surgical History  Procedure Laterality Date  . Colectomy  2011  . Cholecystectomy    . Knee arthroscopy      bilateral     Discharge Condition: stable   Follow-up Information   Follow up with Lujean Amel, MD. Schedule an appointment as soon as possible for a visit in 3 days.   Specialty:  Family Medicine   Contact information:   [address] [place] [address] [phone]       Follow up with [facility], MD. Schedule an appointment as soon as possible for a visit in 1 week.   Specialty:  Oncology   Contact information:   [address]. [place] [address] [phone]       Follow up with HAYES,JOHN C, MD. Schedule an appointment as soon as possible for a visit in 1 week.   Specialty:  Gastroenterology    Contact information:   [address] [place] [address] [phone]         Consults obtained - VI Dr. Amedeo Plenty, oncologist Dr. Alen Blew over the phone   Discharge Medications      Medication List    STOP taking these medications       hydrochlorothiazide 12.5 MG tablet  Commonly known as:  HYDRODIURIL      TAKE these medications       cephALEXin 500 MG capsule  Commonly known as:  KEFLEX  Take 500 mg by mouth 4 (four) times daily. Started on 07-22-13 for 10 day therapy. Pt's on day 2 of therapy     co-enzyme Q-10 30 MG capsule  Take 30 mg by mouth daily.     furosemide 40 MG tablet  Commonly known as:  LASIX  Take 1 tablet (40 mg total) by mouth daily.     methocarbamol 500 MG tablet  Commonly known as:  ROBAXIN  Take 500  mg by mouth 4 (four) times daily as needed (for muscle spasms).     MULTIVITAMIN PO  Take 1 tablet by mouth daily.     spironolactone 25 MG tablet  Commonly known as:  ALDACTONE  Take 1 tablet (25 mg total) by mouth daily.     Vitamin D-3 5000 UNITS Tabs  Take 5,000 Units by mouth daily.         Diet and Activity recommendation: See Discharge Instructions below   Discharge Instructions     Follow with Primary MD Lujean Amel, MD in 3 days   Get CBC, CMP, checked 3 days by Primary MD and again as instructed by your Primary MD.   Get Medicines reviewed and adjusted.  Please request your Prim.MD to go over all Hospital Tests and Procedure/Radiological results at the follow up, please get all Hospital records sent to your Prim MD by signing hospital release before you go home.  Activity: As tolerated with Full fall precautions use walker/cane & assistance as needed   Diet: Heart Healthy,  Fluid restriction 1.8 lit/day, Aspiration precautions.   For Heart failure patients - Check your Weight same time everyday, if you gain over 2 pounds, or you develop in leg swelling, experience more shortness of breath or chest  pain, call your Primary MD immediately. Follow Cardiac Low Salt Diet and 1.8 lit/day fluid restriction.  Disposition Home    If you experience worsening of your admission symptoms, develop shortness of breath, life threatening emergency, suicidal or homicidal thoughts you must seek medical attention immediately by calling 911 or calling your MD immediately  if symptoms less severe.  You Must read complete instructions/literature along with all the possible adverse reactions/side effects for all the Medicines you take and that have been prescribed to you. Take any new Medicines after you have completely understood and accpet all the possible adverse reactions/side effects.   Do not drive and provide baby sitting services if your were admitted for syncope or siezures until you have seen by Primary MD or a Neurologist and advised to do so again.  Do not drive when taking Pain medications.    Do not take more than prescribed Pain, Sleep and Anxiety Medications  Special Instructions: If you have smoked or chewed Tobacco  in the last 2 yrs please stop smoking, stop any regular Alcohol  and or any Recreational drug use.  Wear Seat belts while driving.   Please note  You were cared for by a hospitalist during your hospital stay. If you have any questions about your discharge medications or the care you received while you were in the hospital after you are discharged, you can call the unit and asked to speak with the hospitalist on call if the hospitalist that took care of you is not available. Once you are discharged, your primary care physician will handle any further medical issues. Please note that NO REFILLS for any discharge medications will be authorized once you are discharged, as it is imperative that you return to your primary care physician (or establish a relationship with a primary care physician if you do not have one) for your aftercare needs so that they can reassess your need for  medications and monitor your lab values.    Major procedures and Radiology Reports - PLEASE review detailed and final reports for all details, in brief -       Dg Chest 2 View  07/23/2013   CLINICAL DATA:  Dyspnea  EXAM: CHEST  2  VIEW  COMPARISON:  November 12, 2010  FINDINGS: There is minimal scarring in the left lower lobe. Lungs are otherwise clear. Heart is upper normal in size with normal pulmonary vascularity. No adenopathy. There is degenerative change in the thoracic spine.  IMPRESSION: No edema or consolidation.   Electronically Signed   By: Lowella Grip M.D.   On: 07/23/2013 19:10   Ct Abdomen Pelvis W Contrast  07/20/2013   CLINICAL DATA:  Right-sided abdominal pain, blood in stool and diarrhea. Prior partial colectomy  EXAM: CT ABDOMEN AND PELVIS WITH CONTRAST  TECHNIQUE: Multidetector CT imaging of the abdomen and pelvis was performed using the standard protocol following bolus administration of intravenous contrast.  CONTRAST:  142m OMNIPAQUE IOHEXOL 300 MG/ML  SOLN  COMPARISON:  03/10/2010  FINDINGS: Exam detail is suboptimal due to patient body habitus.  Curvilinear bilateral lower lobe scarring is reidentified.  The liver is nodular in contour and hypodense. No measurable focal mass lesion. Splenomegaly is identified, 20.6 cm in maximal length image 32. Bilateral upper quadrant ascites is present. The liver contour is mildly nodular. No intra or extrahepatic ductal dilatation. Cholecystectomy clips are noted. Pancreas, adrenal glands are unremarkable. Ill-defined 1.5 cm left lower renal pole and 1 cm right upper renal pole representative cortical hypodense lesions are identified, unchanged from the prior exam allowing for differences in technique. Evidence of portal hypertension with splenorenal collateral vessels.  No bowel wall thickening or focal segmental dilatation. Uterus and ovaries are normal. Colonic diverticuli noted without evidence for diverticulitis. Changes of  probable right hemicolectomy are identified. No lymphadenopathy or free air. No acute osseous finding. Subcutaneous edema is noted within the lower abdomen abdominal wall tissues.  IMPRESSION: Imaging findings suggestive of cirrhosis with portal hypertension and splenomegaly.  Ascites.  No acute abnormality identified.   Electronically Signed   By: GConchita ParisM.D.   On: 07/20/2013 16:02    Micro Results      No results found for this or any previous visit (from the past 240 hour(s)).   History of present illness and  Hospital Course:     Kindly see H&P for history of present illness and admission details, please review complete Labs, Consult reports and Test reports for all details in brief LCHARLYN Khan is a 57y.o. female, patient with history of  borderline DM, HTN, thrombocytopenia (autoimmune), status post partial colectomy due to tubular adenoma, OSA on nightly CPAP, presented to the ED for the second time in 4 days with worsening abdominal pain. Patient gives history of baseline 3-4 soft/loose stools since partial colectomy. She gives 6 weeks history of urgency to stool following oral intake. 4 weeks history of intermittent abdominal bloating and pain which have got worse in the last 1 week. Pain was initially right-sided now is more diffuse, constant, rated at 5-6/10 in intensity was not doing anything in increases to 9/10 with movement, sneezing or coughing. Denies nausea, vomiting, fever or chills. Appetite is decreased. Consistency and frequency of stools has remained the same. She noticed couple of drops of blood per rectum 3 days ago but none since then. No melena. She was seen in the ED on 11/1 and CT abdomen and pelvis showed new diagnosis of cirrhosis of liver, portal hypertension, splenomegaly and ascites.     Patient was then seen by her PCP who noticed a rash on her abdomen and started her on oral Keflex. Some labs were drawn during that visit on Thursday 2 which apparently  showed  platelet count of 37K. She was advised to come to the ED for further evaluation. She states that she has had hepatitis testing done twice approximately 2 years ago and recently which were negative. She denies alcohol abuse or history of hepatitis.   She was seen here by GI, workup for cirrhosis has been initiated by GI, she feels much better on diuretics, which will be continued, will request primary care physician to continue monitoring her weight, ascites and BMP and adjust diuretic dose as needed. She will follow with GI Dr. Paulita Fujita or Dr. Amedeo Plenty in a few days and followup on her cirrhosis workup. Her examination was not consistent with peritonitis. She does have a rash on her belly which appears consistent mild petechiae due to skin stretching from comminution of obesity and ascites setting of chronic thrombocytopenia.Her thrombocytopenia seems to be more consistent with portal hypertension due to underlying cirrhosis, this certainly could be cirrhosis caused by NASH.   I have discussed her case in detail with her primary oncologist Dr. Alen Blew who reviewed her labs and recommended outpatient followup with him, with continued diuresis her ascites should improve and her rash should improve also. Outpatient monitoring of BMP and ascites, rash, platelet count is recommended. She has been requested to follow with PCP, GI and Dr. Alen Blew her hematology oncology physician post discharge.      Today   Subjective:   Hannah Khan today has no headache,no chest abdominal pain,no new weakness tingling or numbness, feels much better wants to go home today.   Objective:   Blood pressure 138/70, pulse 79, temperature 97.8 F (36.6 C), temperature source Oral, resp. rate 16, height 5' 7"  (1.702 m), weight 154.268 kg (340 lb 1.6 oz), SpO2 97.00%.   Intake/Output Summary (Last 24 hours) at 07/24/13 1305 Last data filed at 07/23/13 1919  Gross per 24 hour  Intake      0 ml  Output      0 ml  Net       0 ml    Exam Awake Alert, Oriented *3, No new F.N deficits, Normal affect [place].AT,PERRAL Supple Neck,No JVD, No cervical lymphadenopathy appriciated.  Symmetrical Chest wall movement, Good air movement bilaterally, CTAB RRR,No Gallops,Rubs or new Murmurs, No Parasternal Heave +ve B.Sounds, Abd Soft but distended, Non tender, does have a mild petechial rash on her abdominal wall not consistent with pannuculitis, No organomegaly appriciated, No rebound -guarding or rigidity. No Cyanosis, Clubbing or edema, No new Rash or bruise  Data Review   CBC w Diff: Lab Results  Component Value Date   WBC 4.1 07/24/2013   WBC 3.1* 04/15/2013   HGB 11.2* 07/24/2013   HGB 12.0 04/15/2013   HCT 32.9* 07/24/2013   HCT 35.2 04/15/2013   PLT PLATELET CLUMPS NOTED ON SMEAR, UNABLE TO ESTIMATE 07/24/2013   PLT 41* 04/15/2013   LYMPHOPCT 25 07/23/2013   LYMPHOPCT 25.9 04/15/2013   MONOPCT 8 07/23/2013   MONOPCT 8.7 04/15/2013   EOSPCT 2 07/23/2013   EOSPCT 1.5 04/15/2013   BASOPCT 1 07/23/2013   BASOPCT 0.5 04/15/2013    CMP: Lab Results  Component Value Date   NA 140 07/23/2013   NA 143 04/15/2013   K 3.6 07/23/2013   K 4.1 04/15/2013   CL 105 07/23/2013   CL 107 08/03/2012   CO2 24 07/23/2013   CO2 26 04/15/2013   BUN 10 07/23/2013   BUN 12.3 04/15/2013   CREATININE 0.56 07/23/2013   CREATININE 0.8 04/15/2013   PROT  7.6 07/23/2013   PROT 7.5 04/15/2013   ALBUMIN 3.7 07/23/2013   ALBUMIN 3.5 04/15/2013   BILITOT 1.8* 07/23/2013   BILITOT 1.47* 04/15/2013   ALKPHOS 76 07/23/2013   ALKPHOS 82 04/15/2013   AST 39* 07/23/2013   AST 47* 04/15/2013   ALT 20 07/23/2013   ALT 25 04/15/2013  .   Total Time in preparing paper work, data evaluation and todays exam - 35 minutes  Thurnell Lose M.D on 07/24/2013 at [place]  [phone]

## 2013-07-24 NOTE — Progress Notes (Signed)
UR COMPLETED  

## 2013-07-24 NOTE — Consult Note (Signed)
[facility] Consult Note  Referring Provider: No ref. provider found Primary Care Physician:  Lujean Amel, MD Primary Gastroenterologist:  Dr.  Laurel Dimmer Complaint: Abdominal pain and distention HPI: Hannah Khan is an 57 y.o. white female  admitted yesterday with concerns of ascites needed to be tapped. She presented in little over a week ago with diffuse abdominal pain and distention and was seen in the [place] [facility] walk-in clinic noted to have a ecchymotic rash over her abdomen and sent to the emergency room where she had multiple tests done including an abdominal CT scan which showed cirrhosis splenomegaly and ascites. She also had some slight bright red blood per him and there is consideration of admission but she was released outpatient followup. I 2 days later primary care physician direct back to the emergency room where she was admitted. She has multiple medical problems including morbid obesity and chronic thrombocytopenia with recent platelets no higher than 35,000 and sometimes clumped to her that cannot be quantified. It was decided to give her IV Lasix and she's had a good diuresis response and states she's lost 9 pounds between yesterday and today. She denies any alcohol use or any previous history of chronic liver disease. Apparently she had hepatitis A, B, and C studies drawn last week which were negative. She has no family history of any chronic liver disease. Her abdominal pain is somewhat better, seemingly in association with her diuresis. She also has a history of partial colectomy within the last year due to unresectable colon polyp.  Past Medical History  Diagnosis Date  . Sleep apnea   . Fibromyalgia   . Hx of colonic polyps   . HTN (hypertension)   . ITP (idiopathic thrombocytopenic purpura)     Past Surgical History  Procedure Laterality Date  . Colectomy  2011  . Cholecystectomy    . Knee arthroscopy      bilateral    Medications Prior to Admission   Medication Sig Dispense Refill  . cephALEXin (KEFLEX) 500 MG capsule Take 500 mg by mouth 4 (four) times daily. Started on 07-22-13 for 10 day therapy. Pt's on day 2 of therapy      . Cholecalciferol (VITAMIN D-3) 5000 UNITS TABS Take 5,000 Units by mouth daily.       Marland Kitchen co-enzyme Q-10 30 MG capsule Take 30 mg by mouth daily.      . hydrochlorothiazide (HYDRODIURIL) 12.5 MG tablet Take 12.5 mg by mouth daily.      . methocarbamol (ROBAXIN) 500 MG tablet Take 500 mg by mouth 4 (four) times daily as needed (for muscle spasms).       . Multiple Vitamins-Minerals (MULTIVITAMIN PO) Take 1 tablet by mouth daily.        Allergies:  Allergies  Allergen Reactions  . Celecoxib     "speech slurred"  . Prednisone     Rapid HR  . Statins     aggrevate fibromyalgia    Family History  Problem Relation Age of Onset  . Heart failure Mother   . Lung cancer Father     Social History:  reports that she quit smoking about 38 years ago. Her smoking use included Cigarettes. She has a 3 pack-year smoking history. She does not have any smokeless tobacco history on file. She reports that she does not drink alcohol or use illicit drugs.  Review of Systems: negative except as above   Blood pressure 138/70, pulse 79, temperature 97.8 F (36.6 C), temperature source Oral, resp. rate  16, height 5' 7"  (1.702 m), weight 154.268 kg (340 lb 1.6 oz), SpO2 97.00%. Head: Normocephalic, without obvious abnormality, atraumatic Neck: no adenopathy, no carotid bruit, no JVD, supple, symmetrical, trachea midline and thyroid not enlarged, symmetric, no tenderness/mass/nodules Resp: clear to auscultation bilaterally Cardio: regular rate and rhythm, S1, S2 normal, no murmur, click, rub or gallop GI: Abdomen obese difficult to assess signs of ascites there is some ecchymosis over the abdominal wall skin no palpable organomegaly. Exam limited due to obesity. Extremities: extremities normal, atraumatic, no cyanosis or  edema  Results for orders placed during the hospital encounter of 07/23/13 (from the past 48 hour(s))  CBC WITH DIFFERENTIAL     Status: Abnormal   Collection Time    07/23/13  3:23 PM      Result Value Range   WBC 3.5 (*) 4.0 - 10.5 K/uL   RBC 3.65 (*) 3.87 - 5.11 MIL/uL   Hemoglobin 10.9 (*) 12.0 - 15.0 g/dL   HCT 31.9 (*) 36.0 - 46.0 %   MCV 87.4  78.0 - 100.0 fL   MCH 29.9  26.0 - 34.0 pg   MCHC 34.2  30.0 - 36.0 g/dL   RDW 15.1  11.5 - 15.5 %   Platelets    150 - 400 K/uL   Value: PLATELET CLUMPS NOTED ON SMEAR, COUNT APPEARS DECREASED   Comment: SPECIMEN CHECKED FOR CLOTS     PLATELETS APPEAR DECREASED   Neutrophils Relative % 65  43 - 77 %   Neutro Abs 2.3  1.7 - 7.7 K/uL   Lymphocytes Relative 25  12 - 46 %   Lymphs Abs 0.9  0.7 - 4.0 K/uL   Monocytes Relative 8  3 - 12 %   Monocytes Absolute 0.3  0.1 - 1.0 K/uL   Eosinophils Relative 2  0 - 5 %   Eosinophils Absolute 0.1  0.0 - 0.7 K/uL   Basophils Relative 1  0 - 1 %   Basophils Absolute 0.0  0.0 - 0.1 K/uL  COMPREHENSIVE METABOLIC PANEL     Status: Abnormal   Collection Time    07/23/13  3:23 PM      Result Value Range   Sodium 140  135 - 145 mEq/L   Potassium 3.6  3.5 - 5.1 mEq/L   Chloride 105  96 - 112 mEq/L   CO2 24  19 - 32 mEq/L   Glucose, Bld 84  70 - 99 mg/dL   BUN 10  6 - 23 mg/dL   Creatinine, Ser 0.56  0.50 - 1.10 mg/dL   Calcium 9.2  8.4 - 10.5 mg/dL   Total Protein 7.6  6.0 - 8.3 g/dL   Albumin 3.7  3.5 - 5.2 g/dL   AST 39 (*) 0 - 37 U/L   ALT 20  0 - 35 U/L   Alkaline Phosphatase 76  39 - 117 U/L   Total Bilirubin 1.8 (*) 0.3 - 1.2 mg/dL   GFR calc non Af Amer >90  >90 mL/min   GFR calc Af Amer >90  >90 mL/min   Comment: (NOTE)     The eGFR has been calculated using the CKD EPI equation.     This calculation has not been validated in all clinical situations.     eGFR's persistently <90 mL/min signify possible Chronic Kidney     Disease.  LIPASE, BLOOD     Status: None   Collection Time     07/23/13  3:23 PM  Result Value Range   Lipase 27  11 - 59 U/L  URINALYSIS, ROUTINE W REFLEX MICROSCOPIC     Status: Abnormal   Collection Time    07/23/13  5:08 PM      Result Value Range   Color, Urine YELLOW  YELLOW   APPearance CLOUDY (*) CLEAR   Specific Gravity, Urine 1.009  1.005 - 1.030   pH 5.5  5.0 - 8.0   Glucose, UA NEGATIVE  NEGATIVE mg/dL   Hgb urine dipstick MODERATE (*) NEGATIVE   Bilirubin Urine NEGATIVE  NEGATIVE   Ketones, ur NEGATIVE  NEGATIVE mg/dL   Protein, ur NEGATIVE  NEGATIVE mg/dL   Urobilinogen, UA 0.2  0.0 - 1.0 mg/dL   Nitrite NEGATIVE  NEGATIVE   Leukocytes, UA NEGATIVE  NEGATIVE  URINE MICROSCOPIC-ADD ON     Status: Abnormal   Collection Time    07/23/13  5:08 PM      Result Value Range   Squamous Epithelial / LPF FEW (*) RARE   RBC / HPF 3-6  <3 RBC/hpf   Bacteria, UA RARE  RARE   Urine-Other MUCOUS PRESENT     Comment: AMORPHOUS URATES/PHOSPHATES     LESS THAN 10 mL OF URINE SUBMITTED  GLUCOSE, CAPILLARY     Status: Abnormal   Collection Time    07/23/13  9:13 PM      Result Value Range   Glucose-Capillary 140 (*) 70 - 99 mg/dL   Comment 1 Documented in Chart     Comment 2 Notify RN    CBC     Status: Abnormal   Collection Time    07/24/13  6:08 AM      Result Value Range   WBC 4.1  4.0 - 10.5 K/uL   Comment: WHITE COUNT CONFIRMED ON SMEAR   RBC 3.74 (*) 3.87 - 5.11 MIL/uL   Hemoglobin 11.2 (*) 12.0 - 15.0 g/dL   HCT 32.9 (*) 36.0 - 46.0 %   MCV 88.0  78.0 - 100.0 fL   MCH 29.9  26.0 - 34.0 pg   MCHC 34.0  30.0 - 36.0 g/dL   RDW 15.6 (*) 11.5 - 15.5 %   Platelets PLATELET CLUMPS NOTED ON SMEAR, UNABLE TO ESTIMATE  150 - 400 K/uL   Dg Chest 2 View  07/23/2013   CLINICAL DATA:  Dyspnea  EXAM: CHEST  2 VIEW  COMPARISON:  November 12, 2010  FINDINGS: There is minimal scarring in the left lower lobe. Lungs are otherwise clear. Heart is upper normal in size with normal pulmonary vascularity. No adenopathy. There is degenerative  change in the thoracic spine.  IMPRESSION: No edema or consolidation.   Electronically Signed   By: Lowella Grip M.D.   On: 07/23/2013 19:10    Assessment: Abdominal pain and associated with portal hypertension ascites and cirrhosis suspect nonalcoholic steatohepatitis with cirrhosis Plan:  Recommend Aldactone 100 mg a day and Lasix 40 mg a day. Would probably defer paracentesis due to her thrombocytopenia. She has a followup appointment with Dr. Paulita Fujita in 3 days that was arranged prior to this admission again she probably should have blood work to rule out hemachromatosis as well as possibly autoimmune hepatitis  alpha-1 antitrypsin deficiency, primary, biliary cirrhosis etc. although her liver function tests are normal. I feel that she could be discharged today with outpatient followup. Adalyna Godbee C 07/24/2013, 11:22 AM

## 2013-07-24 NOTE — Progress Notes (Signed)
[name] to be D/C'd Home per MD order.  Discussed with the patient and all questions fully answered.    Medication List    STOP taking these medications       hydrochlorothiazide 12.5 MG tablet  Commonly known as:  HYDRODIURIL      TAKE these medications       cephALEXin 500 MG capsule  Commonly known as:  KEFLEX  Take 500 mg by mouth 4 (four) times daily. Started on 07-22-13 for 10 day therapy. Pt's on day 2 of therapy     co-enzyme Q-10 30 MG capsule  Take 30 mg by mouth daily.     furosemide 40 MG tablet  Commonly known as:  LASIX  Take 1 tablet (40 mg total) by mouth daily.     methocarbamol 500 MG tablet  Commonly known as:  ROBAXIN  Take 500 mg by mouth 4 (four) times daily as needed (for muscle spasms).     MULTIVITAMIN PO  Take 1 tablet by mouth daily.     spironolactone 25 MG tablet  Commonly known as:  ALDACTONE  Take 1 tablet (25 mg total) by mouth daily.     Vitamin D-3 5000 UNITS Tabs  Take 5,000 Units by mouth daily.        VVS, Skin clean, dry and intact without evidence of skin break down, no evidence of skin tears noted. IV catheter discontinued intact. Site without signs and symptoms of complications. Dressing and pressure applied.  An After Visit Summary was printed and given to the patient. Patient escorted via [place], and D/C home via private auto.  KILIE, RUND D 07/24/2013 1:27 PM

## 2013-07-26 ENCOUNTER — Telehealth: Payer: Self-pay | Admitting: *Deleted

## 2013-07-26 NOTE — Telephone Encounter (Signed)
Pt left a message stating she was recently discharged from the hospital and was wondering if she needs to come see Dr Alen Blew sooner than 08/22/13.

## 2013-07-29 NOTE — ED Provider Notes (Signed)
Medical screening examination/treatment/procedure(s) were performed by non-physician practitioner and as supervising physician I was immediately available for consultation/collaboration.  EKG Interpretation   None       Medical screening examination/treatment/procedure(s) were performed by non-physician practitioner and as supervising physician I was immediately available for consultation/collaboration.  EKG Interpretation   None         Lebron Quam, MD 07/29/13 [phone]

## 2013-08-22 ENCOUNTER — Ambulatory Visit (HOSPITAL_BASED_OUTPATIENT_CLINIC_OR_DEPARTMENT_OTHER): Payer: BC Managed Care – PPO | Admitting: Oncology

## 2013-08-22 ENCOUNTER — Encounter (INDEPENDENT_AMBULATORY_CARE_PROVIDER_SITE_OTHER): Payer: Self-pay

## 2013-08-22 ENCOUNTER — Telehealth: Payer: Self-pay | Admitting: Oncology

## 2013-08-22 ENCOUNTER — Other Ambulatory Visit (HOSPITAL_BASED_OUTPATIENT_CLINIC_OR_DEPARTMENT_OTHER): Payer: BC Managed Care – PPO | Admitting: Lab

## 2013-08-22 VITALS — BP 142/60 | HR 77 | Temp 97.5°F | Resp 18 | Ht 67.0 in | Wt 333.0 lb

## 2013-08-22 DIAGNOSIS — D61818 Other pancytopenia: Secondary | ICD-10-CM

## 2013-08-22 DIAGNOSIS — D6949 Other primary thrombocytopenia: Secondary | ICD-10-CM

## 2013-08-22 DIAGNOSIS — D696 Thrombocytopenia, unspecified: Secondary | ICD-10-CM

## 2013-08-22 DIAGNOSIS — K7460 Unspecified cirrhosis of liver: Secondary | ICD-10-CM

## 2013-08-22 LAB — COMPREHENSIVE METABOLIC PANEL (CC13)
ALT: 27 U/L (ref 0–55)
Albumin: 3.7 g/dL (ref 3.5–5.0)
Alkaline Phosphatase: 78 U/L (ref 40–150)
Anion Gap: 10 mEq/L (ref 3–11)
CO2: 27 mEq/L (ref 22–29)
Calcium: 9.6 mg/dL (ref 8.4–10.4)
Chloride: 103 mEq/L (ref 98–109)
Creatinine: 0.8 mg/dL (ref 0.6–1.1)
Potassium: 4.2 mEq/L (ref 3.5–5.1)
Sodium: 140 mEq/L (ref 136–145)
Total Protein: 7.7 g/dL (ref 6.4–8.3)

## 2013-08-22 LAB — CBC WITH DIFFERENTIAL/PLATELET
BASO%: 0.7 % (ref 0.0–2.0)
Basophils Absolute: 0 10*3/uL (ref 0.0–0.1)
HCT: 36.5 % (ref 34.8–46.6)
HGB: 12 g/dL (ref 11.6–15.9)
MCH: 29.7 pg (ref 25.1–34.0)
MCHC: 32.8 g/dL (ref 31.5–36.0)
MCV: 90.4 fL (ref 79.5–101.0)
MONO#: 0.3 10*3/uL (ref 0.1–0.9)
MONO%: 8.7 % (ref 0.0–14.0)
NEUT%: 61 % (ref 38.4–76.8)
RBC: 4.03 10*6/uL (ref 3.70–5.45)
RDW: 16.1 % — ABNORMAL HIGH (ref 11.2–14.5)
WBC: 4 10*3/uL (ref 3.9–10.3)
lymph#: 1.1 10*3/uL (ref 0.9–3.3)

## 2013-08-22 NOTE — Progress Notes (Signed)
Hematology and Oncology Follow Up Visit  Hannah Khan [id number] 1956/06/12 57 y.o. 08/22/2013 4:05 PM  CC: Hannah Silence, MD Hannah Khan, M.D. Hannah Khan. Hannah Pulling, MD Hannah Khan, M.D.    Principle Diagnosis: This is a 57 year old female with the following issues:   1. Thrombocytopenia most likely autoimmune versus cirrhosis of the liver. She is currently on observation and surveillance. 2.     Status post a partial colectomy due to a tubular adenoma, without any evidence of high-grade dysplasia or malignancy.   Current therapy: Observation, surveillance  Interim History: Hannah Khan presents today for a follow-up visit.  Very pleasant 57 year old with autoimmune thrombocytopenia and underwent a successful hemicolectomy back in May 2011 for a tubular adenoma.  Since her last visit, she was hospitalized for abdominal pain and was diagnosed with cirrhosis of the liver. She is doing well her discharge. She is did not report any changes in her performance status.  She had not reported any  hematochezia.  She did report improvement her activity level and been back at work without decline. No other bleeding episodes.   Medications: I have reviewed the patient's current medications.  Current Outpatient Prescriptions  Medication Sig Dispense Refill  . Cholecalciferol (VITAMIN D-3) 5000 UNITS TABS Take 5,000 Units by mouth daily.       Marland Kitchen co-enzyme Q-10 30 MG capsule Take 30 mg by mouth daily.      . furosemide (LASIX) 40 MG tablet Take 1 tablet (40 mg total) by mouth daily.  30 tablet  0  . methocarbamol (ROBAXIN) 500 MG tablet Take 500 mg by mouth 4 (four) times daily as needed (for muscle spasms).       . Multiple Vitamins-Minerals (MULTIVITAMIN PO) Take 1 tablet by mouth daily.      Marland Kitchen spironolactone (ALDACTONE) 25 MG tablet Take 1 tablet (25 mg total) by mouth daily.  30 tablet  0   No current facility-administered medications for this visit.    Allergies:  Allergies   Allergen Reactions  . Celecoxib     "speech slurred"  . Prednisone     Rapid HR  . Statins     aggrevate fibromyalgia    Past Medical History, Surgical history, Social history, and Family History were reviewed and updated.  Review of Systems:  Remaining ROS negative.  Physical Exam: Blood pressure 142/60, pulse 77, temperature 97.5 F (36.4 C), temperature source Oral, resp. rate 18, height 5' 7"  (1.702 m), weight 333 lb (151.048 kg), SpO2 100.00%. ECOG: 1 General appearance: alert Head: Normocephalic, without obvious abnormality, atraumatic Neck: no adenopathy, no carotid bruit, supple, symmetrical, trachea midline and thyroid not enlarged, symmetric, no tenderness/mass/nodules Lymph nodes: Cervical, supraclavicular, and axillary nodes normal. Heart:regular rate and rhythm, S1, S2 normal, no murmur, click, rub or gallop Lung:chest clear, no wheezing, rales, normal symmetric air entry Abdomen: soft, non-tender, without masses or organomegaly EXT:no erythema, induration, or nodules. Skin: No petechia noted. Very few bruises noted.     Lab Results: Lab Results  Component Value Date   WBC 4.0 08/22/2013   HGB 12.0 08/22/2013   HCT 36.5 08/22/2013   MCV 90.4 08/22/2013   PLT 41* 08/22/2013     Chemistry      Component Value Date/Time   NA 140 07/23/2013 1523   NA 143 04/15/2013 1251   K 3.6 07/23/2013 1523   K 4.1 04/15/2013 1251   CL 105 07/23/2013 1523   CL 107 08/03/2012 1449   CO2 24 07/23/2013 1523  CO2 26 04/15/2013 1251   BUN 10 07/23/2013 1523   BUN 12.3 04/15/2013 1251   CREATININE 0.56 07/23/2013 1523   CREATININE 0.8 04/15/2013 1251      Component Value Date/Time   CALCIUM 9.2 07/23/2013 1523   CALCIUM 9.6 04/15/2013 1251   ALKPHOS 76 07/23/2013 1523   ALKPHOS 82 04/15/2013 1251   AST 39* 07/23/2013 1523   AST 47* 04/15/2013 1251   ALT 20 07/23/2013 1523   ALT 25 04/15/2013 1251   BILITOT 1.8* 07/23/2013 1523   BILITOT 1.47* 04/15/2013 1251     Impression and  Plan:  This is a pleasant 57 year old female with the following issues:     1. Thrombocytopenia most likely autoimmune vs. due to cirrhosis of the liver. For the time being, I do not really see any evidence or any indication for treatment at this point without any evidence of easy bruising or bleeding..  Will continue to monitor her every four months and consider treatment if she develops bleeding or platelet count of 30 K or less.  2. Anemia; Resolved now. 3. New diagnosis of cirrhosis of the liver: Which certainly could be contributing to her thrombocytopenia and following up now with gastroenterology. 4. Follow up: in 4 months.     ZXAQWB,EQUHK 12/4/20144:05 PM

## 2013-08-22 NOTE — Telephone Encounter (Signed)
GAve pt appt for lab and MD April 2015

## 2013-08-23 ENCOUNTER — Encounter (HOSPITAL_COMMUNITY): Payer: Self-pay | Admitting: *Deleted

## 2013-08-26 ENCOUNTER — Encounter (HOSPITAL_COMMUNITY): Payer: Self-pay | Admitting: Pharmacy Technician

## 2013-08-30 ENCOUNTER — Other Ambulatory Visit: Payer: Self-pay | Admitting: Gastroenterology

## 2013-08-30 NOTE — Addendum Note (Signed)
Addended by: [name] on: 08/30/2013 04:27 PM   Modules accepted: Orders

## 2013-09-04 ENCOUNTER — Encounter (HOSPITAL_COMMUNITY): Payer: BC Managed Care – PPO | Admitting: Anesthesiology

## 2013-09-04 ENCOUNTER — Encounter (HOSPITAL_COMMUNITY): Payer: Self-pay

## 2013-09-04 ENCOUNTER — Encounter (HOSPITAL_COMMUNITY)
Admission: RE | Disposition: A | Discharge status: Home / Self Care | Payer: Self-pay | Source: Ambulatory Visit | Attending: Gastroenterology

## 2013-09-04 ENCOUNTER — Ambulatory Visit (HOSPITAL_COMMUNITY)
Admission: RE | Admit: 2013-09-04 | Discharge: 2013-09-04 | Disposition: A | Discharge status: Home / Self Care | Payer: BC Managed Care – PPO | Source: Ambulatory Visit | Attending: Gastroenterology | Admitting: Gastroenterology

## 2013-09-04 ENCOUNTER — Ambulatory Visit (HOSPITAL_COMMUNITY): Payer: BC Managed Care – PPO | Admitting: Anesthesiology

## 2013-09-04 DIAGNOSIS — K766 Portal hypertension: Secondary | ICD-10-CM | POA: Insufficient documentation

## 2013-09-04 DIAGNOSIS — K319 Disease of stomach and duodenum, unspecified: Secondary | ICD-10-CM | POA: Insufficient documentation

## 2013-09-04 DIAGNOSIS — Z9049 Acquired absence of other specified parts of digestive tract: Secondary | ICD-10-CM | POA: Insufficient documentation

## 2013-09-04 DIAGNOSIS — D649 Anemia, unspecified: Secondary | ICD-10-CM | POA: Insufficient documentation

## 2013-09-04 DIAGNOSIS — D175 Benign lipomatous neoplasm of intra-abdominal organs: Secondary | ICD-10-CM | POA: Insufficient documentation

## 2013-09-04 DIAGNOSIS — D696 Thrombocytopenia, unspecified: Secondary | ICD-10-CM | POA: Insufficient documentation

## 2013-09-04 DIAGNOSIS — K7460 Unspecified cirrhosis of liver: Secondary | ICD-10-CM | POA: Insufficient documentation

## 2013-09-04 DIAGNOSIS — I868 Varicose veins of other specified sites: Secondary | ICD-10-CM | POA: Insufficient documentation

## 2013-09-04 HISTORY — DX: Other cirrhosis of liver: K74.69

## 2013-09-04 HISTORY — DX: Nonalcoholic steatohepatitis (NASH): K75.81

## 2013-09-04 HISTORY — DX: Nausea with vomiting, unspecified: Z98.890

## 2013-09-04 HISTORY — PX: ESOPHAGOGASTRODUODENOSCOPY (EGD) WITH PROPOFOL: SHX5813

## 2013-09-04 HISTORY — DX: Unspecified cirrhosis of liver: K74.60

## 2013-09-04 HISTORY — DX: Other specified postprocedural states: R11.2

## 2013-09-04 SURGERY — ESOPHAGOGASTRODUODENOSCOPY (EGD) WITH PROPOFOL
Anesthesia: Monitor Anesthesia Care

## 2013-09-04 MED ORDER — SODIUM CHLORIDE 0.9 % IV SOLN: INTRAVENOUS | Status: DC

## 2013-09-04 MED ORDER — MIDAZOLAM HCL 5 MG/5ML IJ SOLN
INTRAMUSCULAR | Status: DC | PRN
  Administered 2013-09-04: 2 mg via INTRAVENOUS

## 2013-09-04 MED ORDER — PROPOFOL 10 MG/ML IV BOLUS
INTRAVENOUS | Status: AC
  Filled 2013-09-04: qty 20

## 2013-09-04 MED ORDER — LACTATED RINGERS IV SOLN
INTRAVENOUS | Status: DC | PRN
  Administered 2013-09-04: 08:00:00 via INTRAVENOUS

## 2013-09-04 MED ORDER — MIDAZOLAM HCL 2 MG/2ML IJ SOLN
INTRAMUSCULAR | Status: AC
  Filled 2013-09-04: qty 2

## 2013-09-04 MED ORDER — BUTAMBEN-TETRACAINE-BENZOCAINE 2-2-14 % EX AERO
INHALATION_SPRAY | CUTANEOUS | Status: DC | PRN
  Administered 2013-09-04: 2 via TOPICAL

## 2013-09-04 MED ORDER — PROPOFOL 10 MG/ML IV BOLUS
INTRAVENOUS | Status: DC | PRN
  Administered 2013-09-04: 25 mg via INTRAVENOUS
  Administered 2013-09-04 (×2): 50 mg via INTRAVENOUS
  Administered 2013-09-04: 25 mg via INTRAVENOUS

## 2013-09-04 SURGICAL SUPPLY — 15 items

## 2013-09-04 NOTE — Anesthesia Postprocedure Evaluation (Signed)
Anesthesia Post Note  Patient: Hannah Khan  Procedure(s) Performed: Procedure(s) (LRB): ESOPHAGOGASTRODUODENOSCOPY (EGD) WITH PROPOFOL (N/A)  Anesthesia type: MAC  Patient location: PACU  Post pain: Pain level controlled  Post assessment: Post-op Vital signs reviewed  Last Vitals: BP 124/62  Temp(Src) 36.7 C (Oral)  Resp 11  SpO2 100%  Post vital signs: Reviewed  Level of consciousness: awake  Complications: No apparent anesthesia complications

## 2013-09-04 NOTE — Op Note (Signed)
[facility] [place], [address]   ENDOSCOPY PROCEDURE REPORT  PATIENT: Hannah Khan, Hannah Khan  MR#: [id number] BIRTHDATE: [birth year] , 57  yrs. old GENDER: Female ENDOSCOPIST: [name], MD REFERRED BY:  Lujean Amel, M.D. PROCEDURE DATE:  09/04/2013 PROCEDURE:  EGD, screening ASA CLASS:     Class IV INDICATIONS:  cirrhosis, variceal screening. MEDICATIONS: MAC sedation, administered by CRNA TOPICAL ANESTHETIC: Cetacaine Spray  DESCRIPTION OF PROCEDURE: After the risks benefits and alternatives of the procedure were thoroughly explained, informed consent was obtained.  The diagnostic forward-viewing endoscope was introduced through the mouth and advanced to the second portion of the duodenum. Without limitations.  The instrument was slowly withdrawn as the mucosa was fully examined.    Findings:  Normal esophagus; no esophageal varices seen.  In retroflexed views, there was an isolated fairly large cluster of serpiginous gastric varices wtihout red wale signs.  Extensive moderate diffuse portal hypertensive gastropathy.  Otherwise normal stomach and pylorus.  Small lipoma in descending duodenum, otherwise normal duodenum to the second portion.            The scope was then withdrawn from the patient and the procedure completed.  ENDOSCOPIC IMPRESSION:     As above.  Isolated gastric varices and portal hypertensive gastropathy seen.  RECOMMENDATIONS:     1.  Watch for potential complications of procedure. 2.  Low dose propranolol (10 mg bid) for diffuse portal gastropathy and isolated gastric varices.  Recent imaging showed no evidence of splenic vein thrombosis. 3.  Follow-up in GI clinic in 6 months.  eSigned:  [name], MD 09/04/2013 11:01 AM   CC:

## 2013-09-04 NOTE — Anesthesia Preprocedure Evaluation (Signed)
Anesthesia Evaluation  Patient identified by MRN, date of birth, ID band Patient awake    Reviewed: Allergy & Precautions, H&P , NPO status , Patient's Chart, lab work & pertinent test results  History of Anesthesia Complications (+) PONV and history of anesthetic complications  Airway       Dental  (+) Dental Advisory Given   Pulmonary neg pulmonary ROS, shortness of breath, sleep apnea , former smoker,          Cardiovascular hypertension, Pt. on medications negative cardio ROS      Neuro/Psych negative neurological ROS  negative psych ROS   GI/Hepatic negative GI ROS, (+) Cirrhosis -       ,   Endo/Other  negative endocrine ROSMorbid obesity  Renal/GU negative Renal ROS     Musculoskeletal  (+) Fibromyalgia -  Abdominal (+) + obese,   Peds  Hematology  (+) Blood dyscrasia, anemia , ITP   Anesthesia Other Findings   Reproductive/Obstetrics negative OB ROS                           Anesthesia Physical Anesthesia Plan  ASA: IV  Anesthesia Plan: MAC   Post-op Pain Management:    Induction: Intravenous  Airway Management Planned:   Additional Equipment:   Intra-op Plan:   Post-operative Plan:   Informed Consent: I have reviewed the patients History and Physical, chart, labs and discussed the procedure including the risks, benefits and alternatives for the proposed anesthesia with the patient or authorized representative who has indicated his/her understanding and acceptance.   Dental advisory given  Plan Discussed with: CRNA  Anesthesia Plan Comments:         Anesthesia Quick Evaluation

## 2013-09-04 NOTE — Interval H&P Note (Signed)
History and Physical Interval Note:  09/04/2013 10:21 AM  Hannah Khan  has presented today for surgery, with the diagnosis of cirrhosis/variceal screening  The various methods of treatment have been discussed with the patient and family. After consideration of risks, benefits and other options for treatment, the patient has consented to  Procedure(s): ESOPHAGOGASTRODUODENOSCOPY (EGD) WITH PROPOFOL (N/A) as a surgical intervention .  [name] history has been reviewed, patient examined, no change in status, stable for surgery.  I have reviewed the patient's chart and labs.  Questions were answered to the patient's satisfaction.     Hannah Khan M  Assessment:    1.  NAFLD-related cirrhosis.  Plan:  1.  Esophagogastroduodenoscopy for variceal screening. 2.  Risks (bleeding, infection, bowel perforation that could require surgery, sedation-related changes in cardiopulmonary systems), benefits (identification and possible treatment of source of symptoms, exclusion of certain causes of symptoms), and alternatives (watchful waiting, radiographic imaging studies, empiric medical treatment) of upper endoscopy (EGD) were explained to patient/family in detail and patient wishes to proceed.

## 2013-09-04 NOTE — H&P (View-Only) (Signed)
Hematology and Oncology Follow Up Visit  CHONG JANUARY [id number] August 01, 1956 57 y.o. 08/22/2013 4:05 PM  CC: [name], MD [name], M.D. Leighton Ruff. Redmond Pulling, MD Bo Merino, M.D.    Principle Diagnosis: This is a 57 year old female with the following issues:   1. Thrombocytopenia most likely autoimmune versus cirrhosis of the liver. She is currently on observation and surveillance. 2.     Status post a partial colectomy due to a tubular adenoma, without any evidence of high-grade dysplasia or malignancy.   Current therapy: Observation, surveillance  Interim History: Mrs. Baade presents today for a follow-up visit.  Very pleasant 57 year old with autoimmune thrombocytopenia and underwent a successful hemicolectomy back in May 2011 for a tubular adenoma.  Since her last visit, she was hospitalized for abdominal pain and was diagnosed with cirrhosis of the liver. She is doing well her discharge. She is did not report any changes in her performance status.  She had not reported any  hematochezia.  She did report improvement her activity level and been back at work without decline. No other bleeding episodes.   Medications: I have reviewed the patient's current medications.  Current Outpatient Prescriptions  Medication Sig Dispense Refill  . Cholecalciferol (VITAMIN D-3) 5000 UNITS TABS Take 5,000 Units by mouth daily.       Marland Kitchen co-enzyme Q-10 30 MG capsule Take 30 mg by mouth daily.      . furosemide (LASIX) 40 MG tablet Take 1 tablet (40 mg total) by mouth daily.  30 tablet  0  . methocarbamol (ROBAXIN) 500 MG tablet Take 500 mg by mouth 4 (four) times daily as needed (for muscle spasms).       . Multiple Vitamins-Minerals (MULTIVITAMIN PO) Take 1 tablet by mouth daily.      Marland Kitchen spironolactone (ALDACTONE) 25 MG tablet Take 1 tablet (25 mg total) by mouth daily.  30 tablet  0   No current facility-administered medications for this visit.    Allergies:  Allergies   Allergen Reactions  . Celecoxib     "speech slurred"  . Prednisone     Rapid HR  . Statins     aggrevate fibromyalgia    Past Medical History, Surgical history, Social history, and Family History were reviewed and updated.  Review of Systems:  Remaining ROS negative.  Physical Exam: Blood pressure 142/60, pulse 77, temperature 97.5 F (36.4 C), temperature source Oral, resp. rate 18, height 5' 7"  (1.702 m), weight 333 lb (151.048 kg), SpO2 100.00%. ECOG: 1 General appearance: alert Head: Normocephalic, without obvious abnormality, atraumatic Neck: no adenopathy, no carotid bruit, supple, symmetrical, trachea midline and thyroid not enlarged, symmetric, no tenderness/mass/nodules Lymph nodes: Cervical, supraclavicular, and axillary nodes normal. Heart:regular rate and rhythm, S1, S2 normal, no murmur, click, rub or gallop Lung:chest clear, no wheezing, rales, normal symmetric air entry Abdomen: soft, non-tender, without masses or organomegaly EXT:no erythema, induration, or nodules. Skin: No petechia noted. Very few bruises noted.     Lab Results: Lab Results  Component Value Date   WBC 4.0 08/22/2013   HGB 12.0 08/22/2013   HCT 36.5 08/22/2013   MCV 90.4 08/22/2013   PLT 41* 08/22/2013     Chemistry      Component Value Date/Time   NA 140 07/23/2013 1523   NA 143 04/15/2013 1251   K 3.6 07/23/2013 1523   K 4.1 04/15/2013 1251   CL 105 07/23/2013 1523   CL 107 08/03/2012 1449   CO2 24 07/23/2013 1523  CO2 26 04/15/2013 1251   BUN 10 07/23/2013 1523   BUN 12.3 04/15/2013 1251   CREATININE 0.56 07/23/2013 1523   CREATININE 0.8 04/15/2013 1251      Component Value Date/Time   CALCIUM 9.2 07/23/2013 1523   CALCIUM 9.6 04/15/2013 1251   ALKPHOS 76 07/23/2013 1523   ALKPHOS 82 04/15/2013 1251   AST 39* 07/23/2013 1523   AST 47* 04/15/2013 1251   ALT 20 07/23/2013 1523   ALT 25 04/15/2013 1251   BILITOT 1.8* 07/23/2013 1523   BILITOT 1.47* 04/15/2013 1251     Impression and  Plan:  This is a pleasant 57 year old female with the following issues:     1. Thrombocytopenia most likely autoimmune vs. due to cirrhosis of the liver. For the time being, I do not really see any evidence or any indication for treatment at this point without any evidence of easy bruising or bleeding..  Will continue to monitor her every four months and consider treatment if she develops bleeding or platelet count of 30 K or less.  2. Anemia; Resolved now. 3. New diagnosis of cirrhosis of the liver: Which certainly could be contributing to her thrombocytopenia and following up now with gastroenterology. 4. Follow up: in 4 months.     WUAOUM,NARUO 12/4/20144:05 PM

## 2013-09-04 NOTE — Transfer of Care (Signed)
Immediate Anesthesia Transfer of Care Note  Patient: Hannah Khan  Procedure(s) Performed: Procedure(s): ESOPHAGOGASTRODUODENOSCOPY (EGD) WITH PROPOFOL (N/A)  Patient Location: PACU  Anesthesia Type:MAC  Level of Consciousness: awake, alert , oriented and patient cooperative  Airway & Oxygen Therapy: Patient Spontanous Breathing and Patient connected to nasal cannula oxygen  Post-op Assessment: Report given to PACU RN and Post -op Vital signs reviewed and stable  Post vital signs: Reviewed and stable  Complications: No apparent anesthesia complications

## 2013-09-05 ENCOUNTER — Encounter (HOSPITAL_COMMUNITY): Payer: Self-pay | Admitting: Gastroenterology

## 2013-12-20 ENCOUNTER — Other Ambulatory Visit: Payer: BC Managed Care – PPO

## 2013-12-20 ENCOUNTER — Ambulatory Visit: Payer: BC Managed Care – PPO | Admitting: Oncology

## 2014-03-07 ENCOUNTER — Other Ambulatory Visit: Payer: Self-pay | Admitting: Gastroenterology

## 2014-03-07 DIAGNOSIS — K7469 Other cirrhosis of liver: Secondary | ICD-10-CM

## 2014-03-14 ENCOUNTER — Ambulatory Visit
Admission: RE | Admit: 2014-03-14 | Discharge: 2014-03-14 | Disposition: A | Discharge status: Home / Self Care | Payer: BC Managed Care – PPO | Source: Ambulatory Visit | Attending: Gastroenterology | Admitting: Gastroenterology

## 2014-03-14 DIAGNOSIS — K7469 Other cirrhosis of liver: Secondary | ICD-10-CM

## 2014-07-02 ENCOUNTER — Other Ambulatory Visit (INDEPENDENT_AMBULATORY_CARE_PROVIDER_SITE_OTHER): Payer: Self-pay

## 2014-07-02 ENCOUNTER — Other Ambulatory Visit (INDEPENDENT_AMBULATORY_CARE_PROVIDER_SITE_OTHER): Payer: Self-pay | Admitting: General Surgery

## 2014-07-02 DIAGNOSIS — I10 Essential (primary) hypertension: Secondary | ICD-10-CM

## 2014-07-02 DIAGNOSIS — E7800 Pure hypercholesterolemia, unspecified: Secondary | ICD-10-CM

## 2014-07-02 LAB — LIPID PANEL
CHOLESTEROL: 175 mg/dL (ref 0–200)
HDL: 59 mg/dL (ref >39)
LDL Cholesterol: 97 mg/dL (ref 0–99)
Total CHOL/HDL Ratio: 3 Ratio
Triglycerides: 93 mg/dL (ref <150)
VLDL: 19 mg/dL (ref 0–40)

## 2014-07-02 LAB — PROTIME-INR
INR: 1.27 (ref <1.50)
PROTHROMBIN TIME: 15.9 s — AB (ref 11.6–15.2)

## 2014-07-02 LAB — TSH: TSH: 1.491 u[IU]/mL (ref 0.350–4.500)

## 2014-07-02 LAB — IRON AND TIBC
%SAT: 31 % (ref 20–55)
IRON: 86 ug/dL (ref 42–145)
TIBC: 274 ug/dL (ref 250–470)
UIBC: 188 ug/dL (ref 125–400)

## 2014-07-02 LAB — COMPREHENSIVE METABOLIC PANEL
ALK PHOS: 76 U/L (ref 39–117)
ALT: 26 U/L (ref 0–35)
AST: 43 U/L — ABNORMAL HIGH (ref 0–37)
Albumin: 3.7 g/dL (ref 3.5–5.2)
BILIRUBIN TOTAL: 2 mg/dL — AB (ref 0.2–1.2)
BUN: 15 mg/dL (ref 6–23)
CO2: 24 mEq/L (ref 19–32)
Calcium: 9.1 mg/dL (ref 8.4–10.5)
Chloride: 106 mEq/L (ref 96–112)
Creat: 0.62 mg/dL (ref 0.50–1.10)
GLUCOSE: 153 mg/dL — AB (ref 70–99)
Potassium: 4 mEq/L (ref 3.5–5.3)
Sodium: 139 mEq/L (ref 135–145)
Total Protein: 7 g/dL (ref 6.0–8.3)

## 2014-07-02 LAB — FOLATE: FOLATE: 10.6 ng/mL

## 2014-07-02 LAB — T4: T4 TOTAL: 9 ug/dL (ref 4.5–12.0)

## 2014-07-02 LAB — VITAMIN B12: Vitamin B-12: 531 pg/mL (ref 211–911)

## 2014-07-03 LAB — H. PYLORI ANTIBODY, IGG: H PYLORI IGG: 0.72 {ISR}

## 2014-07-03 LAB — CBC
HEMATOCRIT: 36.8 % (ref 36.0–46.0)
HEMOGLOBIN: 12.4 g/dL (ref 12.0–15.0)
MCH: 30 pg (ref 26.0–34.0)
MCHC: 33.7 g/dL (ref 30.0–36.0)
MCV: 89.1 fL (ref 78.0–100.0)
Platelets: 29 10*3/uL — CL (ref 150–400)
RBC: 4.13 MIL/uL (ref 3.87–5.11)
RDW: 14.9 % (ref 11.5–15.5)
WBC: 3.9 10*3/uL — ABNORMAL LOW (ref 4.0–10.5)

## 2014-07-03 LAB — HEMOGLOBIN A1C
HEMOGLOBIN A1C: 6.6 % — AB (ref <5.7)
Mean Plasma Glucose: 143 mg/dL — ABNORMAL HIGH (ref <117)

## 2014-07-03 LAB — VITAMIN D 25 HYDROXY (VIT D DEFICIENCY, FRACTURES): Vit D, 25-Hydroxy: 40 ng/mL (ref 30–89)

## 2014-07-07 ENCOUNTER — Telehealth (INDEPENDENT_AMBULATORY_CARE_PROVIDER_SITE_OTHER): Payer: Self-pay | Admitting: General Surgery

## 2014-07-07 NOTE — Telephone Encounter (Signed)
LVM letting pt know i forwarded her labs to her PCP and Dr Alen Blew and that her plt count was lower than her baseline and asked to call the office

## 2014-07-08 ENCOUNTER — Telehealth: Payer: Self-pay | Admitting: Oncology

## 2014-07-08 NOTE — Telephone Encounter (Signed)
Pt called to sch labs/MD soon per hospital, set pt up with next available visit with Dr. Alen Blew, lft msg for pt and mailed out sch..... KJ

## 2014-07-23 ENCOUNTER — Other Ambulatory Visit (INDEPENDENT_AMBULATORY_CARE_PROVIDER_SITE_OTHER): Payer: Self-pay

## 2014-07-28 ENCOUNTER — Ambulatory Visit (HOSPITAL_COMMUNITY)
Admission: RE | Admit: 2014-07-28 | Discharge: 2014-07-28 | Disposition: A | Discharge status: Home / Self Care | Payer: BC Managed Care – PPO | Source: Ambulatory Visit | Attending: General Surgery | Admitting: General Surgery

## 2014-07-28 ENCOUNTER — Other Ambulatory Visit: Payer: Self-pay

## 2014-07-28 ENCOUNTER — Other Ambulatory Visit (INDEPENDENT_AMBULATORY_CARE_PROVIDER_SITE_OTHER): Payer: Self-pay | Admitting: General Surgery

## 2014-07-28 DIAGNOSIS — Z1231 Encounter for screening mammogram for malignant neoplasm of breast: Secondary | ICD-10-CM | POA: Diagnosis not present

## 2014-07-28 DIAGNOSIS — I10 Essential (primary) hypertension: Secondary | ICD-10-CM

## 2014-07-28 DIAGNOSIS — J9811 Atelectasis: Secondary | ICD-10-CM | POA: Insufficient documentation

## 2014-07-28 DIAGNOSIS — Z01818 Encounter for other preprocedural examination: Secondary | ICD-10-CM | POA: Insufficient documentation

## 2014-07-28 DIAGNOSIS — I517 Cardiomegaly: Secondary | ICD-10-CM | POA: Insufficient documentation

## 2014-07-28 DIAGNOSIS — E7800 Pure hypercholesterolemia, unspecified: Secondary | ICD-10-CM

## 2014-08-01 ENCOUNTER — Ambulatory Visit (HOSPITAL_BASED_OUTPATIENT_CLINIC_OR_DEPARTMENT_OTHER): Payer: BC Managed Care – PPO | Admitting: Oncology

## 2014-08-01 ENCOUNTER — Other Ambulatory Visit (HOSPITAL_BASED_OUTPATIENT_CLINIC_OR_DEPARTMENT_OTHER): Payer: BC Managed Care – PPO

## 2014-08-01 ENCOUNTER — Telehealth: Payer: Self-pay | Admitting: Oncology

## 2014-08-01 VITALS — BP 150/58 | HR 78 | Temp 98.6°F | Resp 20 | Ht 67.0 in | Wt 253.4 lb

## 2014-08-01 DIAGNOSIS — D61818 Other pancytopenia: Secondary | ICD-10-CM

## 2014-08-01 DIAGNOSIS — K7460 Unspecified cirrhosis of liver: Secondary | ICD-10-CM

## 2014-08-01 DIAGNOSIS — D696 Thrombocytopenia, unspecified: Secondary | ICD-10-CM

## 2014-08-01 LAB — CBC WITH DIFFERENTIAL/PLATELET
BASO%: 0.6 % (ref 0.0–2.0)
Basophils Absolute: 0 10*3/uL (ref 0.0–0.1)
EOS%: 1.6 % (ref 0.0–7.0)
Eosinophils Absolute: 0.1 10*3/uL (ref 0.0–0.5)
HCT: 35.3 % (ref 34.8–46.6)
HGB: 11.9 g/dL (ref 11.6–15.9)
LYMPH#: 0.8 10*3/uL — AB (ref 0.9–3.3)
LYMPH%: 25.6 % (ref 14.0–49.7)
MCH: 30.4 pg (ref 25.1–34.0)
MCHC: 33.7 g/dL (ref 31.5–36.0)
MCV: 90.3 fL (ref 79.5–101.0)
MONO#: 0.3 10*3/uL (ref 0.1–0.9)
MONO%: 8.5 % (ref 0.0–14.0)
NEUT#: 2 10*3/uL (ref 1.5–6.5)
NEUT%: 63.7 % (ref 38.4–76.8)
Platelets: 26 10*3/uL — ABNORMAL LOW (ref 145–400)
RBC: 3.91 10*6/uL (ref 3.70–5.45)
RDW: 14.9 % — AB (ref 11.2–14.5)
WBC: 3.2 10*3/uL — ABNORMAL LOW (ref 3.9–10.3)

## 2014-08-01 LAB — COMPREHENSIVE METABOLIC PANEL (CC13)
ALBUMIN: 3.4 g/dL — AB (ref 3.5–5.0)
ALK PHOS: 82 U/L (ref 40–150)
ALT: 30 U/L (ref 0–55)
AST: 54 U/L — AB (ref 5–34)
Anion Gap: 6 mEq/L (ref 3–11)
BUN: 16.9 mg/dL (ref 7.0–26.0)
CO2: 23 mEq/L (ref 22–29)
Calcium: 9.3 mg/dL (ref 8.4–10.4)
Chloride: 112 mEq/L — ABNORMAL HIGH (ref 98–109)
Creatinine: 0.7 mg/dL (ref 0.6–1.1)
Glucose: 99 mg/dl (ref 70–140)
Potassium: 4.1 mEq/L (ref 3.5–5.1)
Sodium: 142 mEq/L (ref 136–145)
Total Bilirubin: 1.71 mg/dL — ABNORMAL HIGH (ref 0.20–1.20)
Total Protein: 7 g/dL (ref 6.4–8.3)

## 2014-08-01 NOTE — Progress Notes (Signed)
Hematology and Oncology Follow Up Visit  Hannah Khan [id number] 09/13/1956 58 y.o. 08/01/2014 3:41 PM  CC: [name], MD [name], M.D. Leighton Ruff. Redmond Pulling, MD Bo Merino, M.D.    Principle Diagnosis: This is a 58 year old female with the following issues:   1. Thrombocytopenia most likely autoimmune versus cirrhosis of the liver. She is currently on observation and surveillance. 2.     Status post a partial colectomy due to a tubular adenoma, without any evidence of high-grade dysplasia or malignancy.   Current therapy: Observation, surveillance  Interim History: Hannah Khan presents today for a follow-up visit. Since her last visit, she is currently under evaluation for possible bariatric surgery. She was noted to have slight decrease in her platelets from her baseline and I was asked to comment on her risk of bleeding. She has reported a recent bruise on her thigh after a trauma and slight increase bleeding after she scraped her thigh. But she has not reported any spontaneous bleeding. She has not reported any hematochezia or melena. Did not report any petechiae or ecchymosis. She has not reported any decline in her energy her performance status. She continues to work full-time without any decline. She has not reported any chest pain or shortness of breath. Does not report any cough or hemoptysis.rest of her review of systems unremarkable.  Medications: I have reviewed the patient's current medications.  Current Outpatient Prescriptions  Medication Sig Dispense Refill  . Cholecalciferol (VITAMIN D-3) 5000 UNITS TABS Take 5,000 Units by mouth 3 (three) times a week.     . co-enzyme Q-10 30 MG capsule Take 180 mg by mouth daily.     . cyanocobalamin 1000 MCG tablet Take 1,000 mcg by mouth daily.    . Ferrous Sulfate (IRON) 325 (65 FE) MG TABS Take 1 tablet by mouth daily.    . furosemide (LASIX) 40 MG tablet Take 40 mg by mouth daily with breakfast.    . Multiple  Vitamins-Minerals (MULTIVITAMIN PO) Take 1 tablet by mouth daily.    . propranolol (INDERAL) 10 MG tablet   5  . spironolactone (ALDACTONE) 25 MG tablet Take 25 mg by mouth daily with breakfast.    . methocarbamol (ROBAXIN) 500 MG tablet Take 500 mg by mouth 4 (four) times daily as needed for muscle spasms.      No current facility-administered medications for this visit.    Allergies:  Allergies  Allergen Reactions  . Celecoxib     "speech slurred" with celebrex  . Prednisone     Rapid HR  . Statins     aggrevate fibromyalgia    Past Medical History, Surgical history, Social history, and Family History were reviewed and updated.  Review of Systems:  Remaining ROS negative.  Physical Exam: Blood pressure 150/58, pulse 78, temperature 98.6 F (37 C), temperature source Oral, resp. rate 20, height 5' 7"  (1.702 m), weight 253 lb 6.4 oz (114.941 kg), SpO2 100 %. ECOG: 1 General appearance: alert Head: Normocephalic, without obvious abnormality, atraumatic Neck: no adenopathy Lymph nodes: Cervical, supraclavicular, and axillary nodes normal. Heart:regular rate and rhythm, S1, S2 normal, no murmur, click, rub or gallop Lung:chest clear, no wheezing, rales, normal symmetric air entry Abdomen: soft, non-tender, without masses or organomegaly EXT:no erythema, induration, or nodules. Skin: No petechia noted. No ecchymosis.   Lab Results: Lab Results  Component Value Date   WBC 3.2* 08/01/2014   HGB 11.9 08/01/2014   HCT 35.3 08/01/2014   MCV 90.3 08/01/2014  PLT 26* 08/01/2014     Chemistry      Component Value Date/Time   NA 139 07/02/2014 1118   NA 140 08/22/2013 1540   K 4.0 07/02/2014 1118   K 4.2 08/22/2013 1540   CL 106 07/02/2014 1118   CL 107 08/03/2012 1449   CO2 24 07/02/2014 1118   CO2 27 08/22/2013 1540   BUN 15 07/02/2014 1118   BUN 14.6 08/22/2013 1540   CREATININE 0.62 07/02/2014 1118   CREATININE 0.8 08/22/2013 1540   CREATININE 0.56 07/23/2013  1523      Component Value Date/Time   CALCIUM 9.1 07/02/2014 1118   CALCIUM 9.6 08/22/2013 1540   ALKPHOS 76 07/02/2014 1118   ALKPHOS 78 08/22/2013 1540   AST 43* 07/02/2014 1118   AST 45* 08/22/2013 1540   ALT 26 07/02/2014 1118   ALT 27 08/22/2013 1540   BILITOT 2.0* 07/02/2014 1118   BILITOT 1.83* 08/22/2013 1540     Impression and Plan:  This is a pleasant 58 year old female with the following issues:     1. Thrombocytopenia most likely autoimmune vs. due to cirrhosis of the liver. Her platelet count has slightly dropped since the last visit but really no active bleeding noted. She is planning to have bariatric surgery with possible bypass versus banding procedure. I believe that she has a moderate risk of bleeding for these procedures. Options to boost her platelet counts leading up to the include steroids, IVIG or platelet transfusions. I feel the most efficient an effective way given her cirrhosis of the liver would be platelet transfusions. That could be done on the day of procedure with 1 unit of platelets prior to her procedure. She also needs to have a PT and a PTT done prior to that given her liver disease. Certainly a banding procedure might be low risk of bleeding at this time. 2. Anemia; Resolved now. 3. Cirrhosis of the liver: she will likely need to be cleared by gastroenterology for her procedure as well. She did have an endoscopy for an evaluation of esophageal paracentesis. 4. Follow up: in 2 months scan sooner if needed to.    Hannah Khan 11/13/20153:41 PM

## 2014-08-01 NOTE — Telephone Encounter (Signed)
gv adn printed appt sched and avs for pt for Jan 2016

## 2014-08-02 ENCOUNTER — Encounter: Payer: Self-pay | Admitting: Dietician

## 2014-08-02 ENCOUNTER — Encounter: Payer: BC Managed Care – PPO | Attending: General Surgery | Admitting: Dietician

## 2014-08-02 VITALS — Ht 67.0 in | Wt 350.1 lb

## 2014-08-02 DIAGNOSIS — E669 Obesity, unspecified: Secondary | ICD-10-CM | POA: Diagnosis not present

## 2014-08-02 DIAGNOSIS — Z713 Dietary counseling and surveillance: Secondary | ICD-10-CM | POA: Diagnosis not present

## 2014-08-02 DIAGNOSIS — Z6841 Body Mass Index (BMI) 40.0 and over, adult: Secondary | ICD-10-CM | POA: Diagnosis not present

## 2014-08-02 NOTE — Progress Notes (Signed)
  Pre-Op Assessment Visit:  Pre-Operative RYGB Surgery  Medical Nutrition Therapy:  Appt start time: 1607   End time:  3710.  Patient was seen on 08/02/2014 for Pre-Operative RYGB Nutrition Assessment. Assessment and letter of approval faxed to [facility] Bariatric Surgery Program coordinator on 08/02/2014.   Preferred Learning Style:   No preference indicated   Learning Readiness:   Ready  Handouts given during visit include:  Pre-Op Goals Bariatric Surgery Protein Shakes  Teaching Method Utilized:  Visual Auditory  Barriers to learning/adherence to lifestyle change: none  Demonstrated degree of understanding via:  Teach Back   Patient to call the [facility] to enroll in Pre-Op and Post-Op Nutrition Education when surgery date is scheduled.

## 2014-08-07 ENCOUNTER — Ambulatory Visit: Payer: BC Managed Care – PPO | Admitting: Dietician

## 2014-10-10 ENCOUNTER — Ambulatory Visit: Payer: BC Managed Care – PPO | Admitting: Oncology

## 2014-10-10 ENCOUNTER — Other Ambulatory Visit: Payer: BC Managed Care – PPO

## 2014-11-13 ENCOUNTER — Telehealth: Payer: Self-pay | Admitting: Oncology

## 2014-11-13 NOTE — Telephone Encounter (Signed)
Pt cancelled due to being out of town will c/b to r/s... KJ

## 2014-11-14 ENCOUNTER — Ambulatory Visit: Payer: BC Managed Care – PPO | Admitting: Oncology

## 2014-11-14 ENCOUNTER — Other Ambulatory Visit: Payer: BC Managed Care – PPO

## 2015-01-29 ENCOUNTER — Emergency Department (HOSPITAL_COMMUNITY)
Admission: EM | Admit: 2015-01-29 | Discharge: 2015-01-29 | Disposition: A | Discharge status: Home / Self Care | Payer: BC Managed Care – PPO | Attending: Emergency Medicine | Admitting: Emergency Medicine

## 2015-01-29 ENCOUNTER — Emergency Department (HOSPITAL_COMMUNITY): Payer: BC Managed Care – PPO

## 2015-01-29 ENCOUNTER — Encounter (HOSPITAL_COMMUNITY): Payer: Self-pay | Admitting: Emergency Medicine

## 2015-01-29 DIAGNOSIS — R5381 Other malaise: Secondary | ICD-10-CM | POA: Insufficient documentation

## 2015-01-29 DIAGNOSIS — R0602 Shortness of breath: Secondary | ICD-10-CM | POA: Insufficient documentation

## 2015-01-29 DIAGNOSIS — Z862 Personal history of diseases of the blood and blood-forming organs and certain disorders involving the immune mechanism: Secondary | ICD-10-CM | POA: Diagnosis not present

## 2015-01-29 DIAGNOSIS — M791 Myalgia, unspecified site: Secondary | ICD-10-CM

## 2015-01-29 DIAGNOSIS — Z79899 Other long term (current) drug therapy: Secondary | ICD-10-CM | POA: Insufficient documentation

## 2015-01-29 DIAGNOSIS — E669 Obesity, unspecified: Secondary | ICD-10-CM | POA: Diagnosis not present

## 2015-01-29 DIAGNOSIS — Z86010 Personal history of colonic polyps: Secondary | ICD-10-CM | POA: Diagnosis not present

## 2015-01-29 DIAGNOSIS — G4730 Sleep apnea, unspecified: Secondary | ICD-10-CM | POA: Insufficient documentation

## 2015-01-29 DIAGNOSIS — Z8719 Personal history of other diseases of the digestive system: Secondary | ICD-10-CM | POA: Diagnosis not present

## 2015-01-29 DIAGNOSIS — Z87891 Personal history of nicotine dependence: Secondary | ICD-10-CM | POA: Insufficient documentation

## 2015-01-29 DIAGNOSIS — E119 Type 2 diabetes mellitus without complications: Secondary | ICD-10-CM | POA: Insufficient documentation

## 2015-01-29 DIAGNOSIS — Z9981 Dependence on supplemental oxygen: Secondary | ICD-10-CM | POA: Insufficient documentation

## 2015-01-29 DIAGNOSIS — I10 Essential (primary) hypertension: Secondary | ICD-10-CM | POA: Insufficient documentation

## 2015-01-29 DIAGNOSIS — R1084 Generalized abdominal pain: Secondary | ICD-10-CM | POA: Insufficient documentation

## 2015-01-29 DIAGNOSIS — R05 Cough: Secondary | ICD-10-CM | POA: Diagnosis not present

## 2015-01-29 DIAGNOSIS — R509 Fever, unspecified: Secondary | ICD-10-CM | POA: Insufficient documentation

## 2015-01-29 LAB — BASIC METABOLIC PANEL
ANION GAP: 8 (ref 5–15)
BUN: 18 mg/dL (ref 6–20)
CHLORIDE: 108 mmol/L (ref 101–111)
CO2: 22 mmol/L (ref 22–32)
Calcium: 9.1 mg/dL (ref 8.9–10.3)
Creatinine, Ser: 0.73 mg/dL (ref 0.44–1.00)
GFR calc non Af Amer: >60 mL/min (ref >60)
Glucose, Bld: 190 mg/dL — ABNORMAL HIGH (ref 65–99)
Potassium: 4.6 mmol/L (ref 3.5–5.1)
SODIUM: 138 mmol/L (ref 135–145)

## 2015-01-29 LAB — URINALYSIS, ROUTINE W REFLEX MICROSCOPIC
BILIRUBIN URINE: NEGATIVE
GLUCOSE, UA: NEGATIVE mg/dL
KETONES UR: NEGATIVE mg/dL
Leukocytes, UA: NEGATIVE
Nitrite: NEGATIVE
PH: 7.5 (ref 5.0–8.0)
PROTEIN: NEGATIVE mg/dL
Specific Gravity, Urine: 1.015 (ref 1.005–1.030)
Urobilinogen, UA: 1 mg/dL (ref 0.0–1.0)

## 2015-01-29 LAB — URINE MICROSCOPIC-ADD ON

## 2015-01-29 LAB — CBC WITH DIFFERENTIAL/PLATELET
BASOS PCT: 0 % (ref 0–1)
Basophils Absolute: 0 10*3/uL (ref 0.0–0.1)
EOS ABS: 0 10*3/uL (ref 0.0–0.7)
Eosinophils Relative: 0 % (ref 0–5)
HEMATOCRIT: 35.5 % — AB (ref 36.0–46.0)
Hemoglobin: 11.6 g/dL — ABNORMAL LOW (ref 12.0–15.0)
Lymphocytes Relative: 6 % — ABNORMAL LOW (ref 12–46)
Lymphs Abs: 0.5 10*3/uL — ABNORMAL LOW (ref 0.7–4.0)
MCH: 29.9 pg (ref 26.0–34.0)
MCHC: 32.7 g/dL (ref 30.0–36.0)
MCV: 91.5 fL (ref 78.0–100.0)
MONO ABS: 0.6 10*3/uL (ref 0.1–1.0)
Monocytes Relative: 7 % (ref 3–12)
NEUTROS ABS: 7.8 10*3/uL — AB (ref 1.7–7.7)
Neutrophils Relative %: 87 % — ABNORMAL HIGH (ref 43–77)
Platelets: 26 10*3/uL — CL (ref 150–400)
RBC: 3.88 MIL/uL (ref 3.87–5.11)
RDW: 14.8 % (ref 11.5–15.5)
WBC: 9 10*3/uL (ref 4.0–10.5)

## 2015-01-29 LAB — HEPATIC FUNCTION PANEL
ALT: 27 U/L (ref 14–54)
AST: 51 U/L — ABNORMAL HIGH (ref 15–41)
Albumin: 3.5 g/dL (ref 3.5–5.0)
Alkaline Phosphatase: 78 U/L (ref 38–126)
BILIRUBIN DIRECT: 0.3 mg/dL (ref 0.1–0.5)
BILIRUBIN INDIRECT: 1.7 mg/dL — AB (ref 0.3–0.9)
TOTAL PROTEIN: 7.3 g/dL (ref 6.5–8.1)
Total Bilirubin: 2 mg/dL — ABNORMAL HIGH (ref 0.3–1.2)

## 2015-01-29 LAB — I-STAT CG4 LACTIC ACID, ED
Lactic Acid, Venous: 1.79 mmol/L (ref 0.5–2.0)
Lactic Acid, Venous: 1.21 mmol/L (ref 0.5–2.0)

## 2015-01-29 MED ORDER — IOHEXOL 300 MG/ML  SOLN
25.0000 mL | Freq: Once | INTRAMUSCULAR | Status: AC | PRN
  Administered 2015-01-29: 25 mL via ORAL

## 2015-01-29 MED ORDER — IOHEXOL 300 MG/ML  SOLN
125.0000 mL | Freq: Once | INTRAMUSCULAR | Status: AC | PRN
  Administered 2015-01-29: 125 mL via INTRAVENOUS

## 2015-01-29 MED ORDER — ONDANSETRON 8 MG PO TBDP
8.0000 mg | ORAL_TABLET | Freq: Once | ORAL | Status: AC
  Administered 2015-01-29: 8 mg via ORAL
  Filled 2015-01-29: qty 1

## 2015-01-29 MED ORDER — SODIUM CHLORIDE 0.9 % IV BOLUS (SEPSIS)
1000.0000 mL | Freq: Once | INTRAVENOUS | Status: AC
  Administered 2015-01-29: 1000 mL via INTRAVENOUS

## 2015-01-29 MED ORDER — ONDANSETRON 4 MG PO TBDP: 4.0000 mg | ORAL_TABLET | Freq: Once | ORAL | Status: DC

## 2015-01-29 MED ORDER — HYDROMORPHONE HCL 1 MG/ML IJ SOLN
1.0000 mg | Freq: Once | INTRAMUSCULAR | Status: AC
  Administered 2015-01-29: 1 mg via INTRAVENOUS
  Filled 2015-01-29: qty 1

## 2015-01-29 NOTE — Discharge Instructions (Signed)
Fever, Adult A fever is a higher than normal body temperature. In an adult, an oral temperature around 98.6 F (37 C) is considered normal. A temperature of 100.4 F (38 C) or higher is generally considered a fever. Mild or moderate fevers generally have no long-term effects and often do not require treatment. Extreme fever (greater than or equal to 106 F or 41.1 C) can cause seizures. The sweating that may occur with repeated or prolonged fever may cause dehydration. Elderly people can develop confusion during a fever. A measured temperature can vary with:  Age.  Time of day.  Method of measurement (mouth, underarm, rectal, or ear). The fever is confirmed by taking a temperature with a thermometer. Temperatures can be taken different ways. Some methods are accurate and some are not.  An oral temperature is used most commonly. Electronic thermometers are fast and accurate.  An ear temperature will only be accurate if the thermometer is positioned as recommended by the manufacturer.  A rectal temperature is accurate and done for those adults who have a condition where an oral temperature cannot be taken.  An underarm (axillary) temperature is not accurate and not recommended. Fever is a symptom, not a disease.  CAUSES   Infections commonly cause fever.  Some noninfectious causes for fever include:  Some arthritis conditions.  Some thyroid or adrenal gland conditions.  Some immune system conditions.  Some types of cancer.  A medicine reaction.  High doses of certain street drugs such as methamphetamine.  Dehydration.  Exposure to high outside or room temperatures.  Occasionally, the source of a fever cannot be determined. This is sometimes called a "fever of unknown origin" (FUO).  Some situations may lead to a temporary rise in body temperature that may go away on its own. Examples are:  Childbirth.  Surgery.  Intense exercise. HOME CARE INSTRUCTIONS   Take  appropriate medicines for fever. Follow dosing instructions carefully. If you use acetaminophen to reduce the fever, be careful to avoid taking other medicines that also contain acetaminophen. Do not take aspirin for a fever if you are younger than age 81. There is an association with Reye's syndrome. Reye's syndrome is a rare but potentially deadly disease.  If an infection is present and antibiotics have been prescribed, take them as directed. Finish them even if you start to feel better.  Rest as needed.  Maintain an adequate fluid intake. To prevent dehydration during an illness with prolonged or recurrent fever, you may need to drink extra fluid.Drink enough fluids to keep your urine clear or pale yellow.  Sponging or bathing with room temperature water may help reduce body temperature. Do not use ice water or alcohol sponge baths.  Dress comfortably, but do not over-bundle. SEEK MEDICAL CARE IF:   You are unable to keep fluids down.  You develop vomiting or diarrhea.  You are not feeling at least partly better after 3 days.  You develop new symptoms or problems. SEEK IMMEDIATE MEDICAL CARE IF:   You have shortness of breath or trouble breathing.  You develop excessive weakness.  You are dizzy or you faint.  You are extremely thirsty or you are making little or no urine.  You develop new pain that was not there before (such as in the head, neck, chest, back, or abdomen).  You have persistent vomiting and diarrhea for more than 1 to 2 days.  You develop a stiff neck or your eyes become sensitive to light.  You develop a  skin rash.  You have a fever or persistent symptoms for more than 2 to 3 days.  You have a fever and your symptoms suddenly get worse. MAKE SURE YOU:   Understand these instructions.  Will watch your condition.  Will get help right away if you are not doing well or get worse. Document Released: 03/01/2001 Document Revised: 01/20/2014 Document  Reviewed: 07/07/2011 [facility] Patient Information 2015 [place], [place]. This information is not intended to replace advice given to you by your health care provider. Make sure you discuss any questions you have with your health care provider.

## 2015-01-29 NOTE — ED Notes (Signed)
Pt states about 2300 tonight she started to bad  States she got a fever, chills, headache, and generalized weakness  Pt states last time she took her temperature it was 102  Pt states due to her medical history she did not take any tylenol or ibuprofen

## 2015-01-29 NOTE — ED Provider Notes (Signed)
  Physical Exam  BP 137/54 mmHg  Pulse 96  Temp(Src) 99.9 F (37.7 C) (Oral)  Resp 20  SpO2 94%  Physical Exam  ED Course  Procedures  MDM  patient had some nausea. CT reassuring. Discharge home.      Davonna Belling, MD 01/29/15 1539

## 2015-01-29 NOTE — ED Provider Notes (Signed)
CSN: [id number]     Arrival date & time 01/29/15  1219 History   First MD Initiated Contact with Patient 01/29/15 0349     Chief Complaint  Patient presents with  . Fever     (Consider location/radiation/quality/duration/timing/severity/associated sxs/prior Treatment) Patient is a 59 y.o. female presenting with general illness.  Illness Location:  Generalized Quality:  Malaise, myalgis Severity:  Moderate Onset quality:  Gradual Duration:  1 day Timing:  Constant Progression:  Unchanged Chronicity:  New Context:  Febrile at home to 102 Relieved by:  Nothing Worsened by:  Nothing Associated symptoms: abdominal pain, cough, fever, myalgias and shortness of breath (chronically)   Associated symptoms: no chest pain, no diarrhea, no nausea and no vomiting     Past Medical History  Diagnosis Date  . Fibromyalgia   . Hx of colonic polyps   . HTN (hypertension)   . ITP (idiopathic thrombocytopenic purpura)   . Sleep apnea     uses cpap setting of 14  . Liver cirrhosis secondary to NASH (nonalcoholic steatohepatitis) dx nov 2014    had enlarged spleen also   . PONV (postoperative nausea and vomiting)     in past none recent  . Diabetes mellitus without complication   . Hyperlipidemia    Past Surgical History  Procedure Laterality Date  . Colectomy  2011  . Knee arthroscopy  yrs ago    bilateral, one done x 1, one done twice  . Cholecystectomy  20 yrs ago  . Cesarean section  1989  . Esophagogastroduodenoscopy (egd) with propofol N/A 09/04/2013    Procedure: ESOPHAGOGASTRODUODENOSCOPY (EGD) WITH PROPOFOL;  Surgeon: [name], MD;  Location: [facility];  Service: Endoscopy;  Laterality: N/A;   Family History  Problem Relation Age of Onset  . Heart failure Mother   . Cancer Mother   . Hyperlipidemia Mother   . Lung cancer Father   . Cancer Father   . Cancer Brother   . Hyperlipidemia Brother   . Stroke Other    History  Substance Use Topics  . Smoking  status: Former Smoker -- 1.00 packs/day for 3 years    Types: Cigarettes    Quit date: 09/19/1974  . Smokeless tobacco: Never Used  . Alcohol Use: No   OB History    No data available     Review of Systems  Constitutional: Positive for fever.  Respiratory: Positive for cough and shortness of breath (chronically).   Cardiovascular: Negative for chest pain.  Gastrointestinal: Positive for abdominal pain. Negative for nausea, vomiting and diarrhea.  Musculoskeletal: Positive for myalgias.  All other systems reviewed and are negative.     Allergies  Celecoxib; Prednisone; Shellfish allergy; and Statins  Home Medications   Prior to Admission medications   Medication Sig Start Date End Date Taking? Authorizing Provider  Cholecalciferol (VITAMIN D-3) 5000 UNITS TABS Take 5,000 Units by mouth 3 (three) times a week.     Historical Provider, MD  co-enzyme Q-10 30 MG capsule Take 180 mg by mouth daily.     Historical Provider, MD  cyanocobalamin 1000 MCG tablet Take 1,000 mcg by mouth daily.    Historical Provider, MD  Ferrous Sulfate (IRON) 325 (65 FE) MG TABS Take 1 tablet by mouth daily.    Historical Provider, MD  furosemide (LASIX) 40 MG tablet Take 40 mg by mouth daily with breakfast. 07/24/13   Thurnell Lose, MD  methocarbamol (ROBAXIN) 500 MG tablet Take 500 mg by mouth 4 (four) times daily  as needed for muscle spasms.     Historical Provider, MD  Multiple Vitamins-Minerals (MULTIVITAMIN PO) Take 1 tablet by mouth daily.    Historical Provider, MD  propranolol (INDERAL) 10 MG tablet  07/02/14   Historical Provider, MD  spironolactone (ALDACTONE) 25 MG tablet Take 25 mg by mouth daily with breakfast. 07/24/13   Thurnell Lose, MD   BP 146/58 mmHg  Pulse 99  Temp(Src) 100.6 F (38.1 C) (Oral)  Resp 22  SpO2 100% Physical Exam  Constitutional: She is oriented to person, place, and time. She appears well-developed and well-nourished.  HENT:  Head: Normocephalic and  atraumatic.  Right Ear: External ear normal.  Left Ear: External ear normal.  Eyes: Conjunctivae and EOM are normal. Pupils are equal, round, and reactive to light.  Neck: Normal range of motion. Neck supple. Normal range of motion present. No Brudzinski's sign and no Kernig's sign noted.  Cardiovascular: Normal rate, regular rhythm, normal heart sounds and intact distal pulses.   Pulmonary/Chest: Effort normal and breath sounds normal.  Abdominal: Soft. Bowel sounds are normal. There is generalized tenderness.  Musculoskeletal: Normal range of motion.  Neurological: She is alert and oriented to person, place, and time.  Skin: Skin is warm and dry.  Vitals reviewed.   ED Course  Procedures (including critical care time) Labs Review Labs Reviewed  CBC WITH DIFFERENTIAL/PLATELET  BASIC METABOLIC PANEL  URINALYSIS, ROUTINE W REFLEX MICROSCOPIC  I-STAT CG4 LACTIC ACID, ED    Imaging Review No results found.   EKG Interpretation None      MDM   Final diagnoses:  None    59 y.o. female with pertinent PMH of ITP, fibromyalgia, obesity presents with generalized malaise, fever, myalgias, ha.  Symptoms began today.  They improved with ibuprofen.  On arrival vitals and physical exam as above.  Wu reassuring.  Symptoms vastly improved after pain medication.  Suspect viral syndrome, doubt meningitis, occult PNA, or other emergent pathology.  Strict return precautions given.  DC home in stable condition  I have reviewed all laboratory and imaging studies if ordered as above  1. Malaise   2. Fever   3. Myalgia         Debby Freiberg, MD 01/30/15 [phone]

## 2015-01-30 ENCOUNTER — Inpatient Hospital Stay (HOSPITAL_COMMUNITY)
Admission: EM | Admit: 2015-01-30 | Discharge: 2015-02-01 | DRG: 603 | Disposition: A | Discharge status: Home / Self Care | Payer: BC Managed Care – PPO | Attending: Internal Medicine | Admitting: Internal Medicine

## 2015-01-30 ENCOUNTER — Encounter (HOSPITAL_COMMUNITY): Payer: Self-pay | Admitting: Emergency Medicine

## 2015-01-30 DIAGNOSIS — K7469 Other cirrhosis of liver: Secondary | ICD-10-CM | POA: Diagnosis present

## 2015-01-30 DIAGNOSIS — L03311 Cellulitis of abdominal wall: Principal | ICD-10-CM | POA: Diagnosis present

## 2015-01-30 DIAGNOSIS — K921 Melena: Secondary | ICD-10-CM | POA: Diagnosis present

## 2015-01-30 DIAGNOSIS — Z9049 Acquired absence of other specified parts of digestive tract: Secondary | ICD-10-CM | POA: Diagnosis present

## 2015-01-30 DIAGNOSIS — I10 Essential (primary) hypertension: Secondary | ICD-10-CM | POA: Diagnosis present

## 2015-01-30 DIAGNOSIS — D61818 Other pancytopenia: Secondary | ICD-10-CM | POA: Diagnosis present

## 2015-01-30 DIAGNOSIS — R0600 Dyspnea, unspecified: Secondary | ICD-10-CM | POA: Diagnosis present

## 2015-01-30 DIAGNOSIS — Z888 Allergy status to other drugs, medicaments and biological substances status: Secondary | ICD-10-CM

## 2015-01-30 DIAGNOSIS — Z87891 Personal history of nicotine dependence: Secondary | ICD-10-CM

## 2015-01-30 DIAGNOSIS — K766 Portal hypertension: Secondary | ICD-10-CM | POA: Diagnosis present

## 2015-01-30 DIAGNOSIS — M793 Panniculitis, unspecified: Secondary | ICD-10-CM | POA: Diagnosis present

## 2015-01-30 DIAGNOSIS — Z6841 Body Mass Index (BMI) 40.0 and over, adult: Secondary | ICD-10-CM

## 2015-01-30 DIAGNOSIS — L0390 Cellulitis, unspecified: Secondary | ICD-10-CM | POA: Diagnosis present

## 2015-01-30 DIAGNOSIS — G4733 Obstructive sleep apnea (adult) (pediatric): Secondary | ICD-10-CM | POA: Diagnosis present

## 2015-01-30 DIAGNOSIS — D649 Anemia, unspecified: Secondary | ICD-10-CM | POA: Diagnosis present

## 2015-01-30 DIAGNOSIS — E785 Hyperlipidemia, unspecified: Secondary | ICD-10-CM | POA: Diagnosis present

## 2015-01-30 DIAGNOSIS — R509 Fever, unspecified: Secondary | ICD-10-CM | POA: Diagnosis not present

## 2015-01-30 DIAGNOSIS — Z91013 Allergy to seafood: Secondary | ICD-10-CM

## 2015-01-30 DIAGNOSIS — K7460 Unspecified cirrhosis of liver: Secondary | ICD-10-CM | POA: Diagnosis present

## 2015-01-30 DIAGNOSIS — E119 Type 2 diabetes mellitus without complications: Secondary | ICD-10-CM | POA: Diagnosis present

## 2015-01-30 DIAGNOSIS — Z86010 Personal history of colonic polyps: Secondary | ICD-10-CM

## 2015-01-30 DIAGNOSIS — I358 Other nonrheumatic aortic valve disorders: Secondary | ICD-10-CM | POA: Diagnosis present

## 2015-01-30 HISTORY — DX: Portal hypertension: K76.6

## 2015-01-30 MED ORDER — CLINDAMYCIN PHOSPHATE 600 MG/50ML IV SOLN
600.0000 mg | Freq: Once | INTRAVENOUS | Status: AC
  Administered 2015-01-31: 600 mg via INTRAVENOUS
  Filled 2015-01-30: qty 50

## 2015-01-30 NOTE — ED Notes (Signed)
Patient with red, warm, painful skin on abdominal panis.  Patient states that it started on left side of panis, now has moved to right side.  Patient has pain upon palpation of panis.  Patient states she was seen earlier in the week at [facility] for fever, which was diagnosed as viral.  Patient is CAOx4.

## 2015-01-30 NOTE — ED Provider Notes (Signed)
CSN: [id number]     Arrival date & time 01/30/15  2248 History   This chart was scribed for Hannah Freiberg, MD by Randa Evens, ED Scribe. This patient was seen in room A11C/A11C and the patient's care was started at 11:22 PM.     Chief Complaint  Patient presents with  . Fever  . Recurrent Skin Infections    Patient is a 59 y.o. female presenting with rash. The history is provided by the patient. No language interpreter was used.  Rash Location:  Torso Torso rash location:  Abd LLQ Quality: redness   Severity:  Moderate Onset quality:  Gradual Duration:  1 day Progression:  Spreading Chronicity:  New Relieved by:  None tried Associated symptoms: abdominal pain and fever   Associated symptoms: not vomiting    HPI Comments: Hannah Khan is a 59 y.o. female who presents to the Emergency Department complaining of new worsening rash to her abdomen onset today. Pt presents with slight fever today. Pt states that the rash is worse with palpitations. Pt reports having abdominal pain that's worse when coughing. She states she has had black diarrhea today as well.   Past Medical History  Diagnosis Date  . Fibromyalgia   . Hx of colonic polyps   . HTN (hypertension)   . ITP (idiopathic thrombocytopenic purpura)   . Sleep apnea     uses cpap setting of 14  . Liver cirrhosis secondary to NASH (nonalcoholic steatohepatitis) dx nov 2014    had enlarged spleen also   . PONV (postoperative nausea and vomiting)     in past none recent  . Diabetes mellitus without complication   . Hyperlipidemia   . Portal hypertension    Past Surgical History  Procedure Laterality Date  . Colectomy  2011  . Knee arthroscopy  yrs ago    bilateral, one done x 1, one done twice  . Cholecystectomy  20 yrs ago  . Cesarean section  1989  . Esophagogastroduodenoscopy (egd) with propofol N/A 09/04/2013    Procedure: ESOPHAGOGASTRODUODENOSCOPY (EGD) WITH PROPOFOL;  Surgeon: [name], MD;   Location: [facility];  Service: Endoscopy;  Laterality: N/A;   Family History  Problem Relation Age of Onset  . Heart failure Mother   . Cancer Mother   . Hyperlipidemia Mother   . Lung cancer Father   . Cancer Father   . Cancer Brother   . Hyperlipidemia Brother   . Stroke Other    History  Substance Use Topics  . Smoking status: Former Smoker -- 1.00 packs/day for 3 years    Types: Cigarettes    Quit date: 09/19/1974  . Smokeless tobacco: Never Used  . Alcohol Use: No   OB History    No data available     Review of Systems  Constitutional: Positive for fever.  Gastrointestinal: Positive for abdominal pain. Negative for vomiting.  Skin: Positive for rash.  All other systems reviewed and are negative.     Allergies  Celecoxib; Prednisone; Shellfish allergy; and Statins  Home Medications   Prior to Admission medications   Medication Sig Start Date End Date Taking? Authorizing Provider  Cholecalciferol (VITAMIN D) 2000 UNITS tablet Take 2,000 Units by mouth daily.   Yes Historical Provider, MD  co-enzyme Q-10 30 MG capsule Take 180 mg by mouth daily.    Yes Historical Provider, MD  cyanocobalamin 1000 MCG tablet Take 1,000 mcg by mouth daily.   Yes Historical Provider, MD  Ferrous Sulfate (IRON) 325 (  65 FE) MG TABS Take 1 tablet by mouth daily.   Yes Historical Provider, MD  furosemide (LASIX) 40 MG tablet Take 40 mg by mouth daily with breakfast. 07/24/13  Yes Thurnell Lose, MD  methocarbamol (ROBAXIN) 500 MG tablet Take 500 mg by mouth 4 (four) times daily as needed for muscle spasms.    Yes Historical Provider, MD  Multiple Vitamins-Minerals (MULTIVITAMIN PO) Take 1 tablet by mouth daily.   Yes Historical Provider, MD  propranolol (INDERAL) 10 MG tablet Take 10 mg by mouth 2 (two) times daily.  07/02/14  Yes Historical Provider, MD  spironolactone (ALDACTONE) 25 MG tablet Take 25 mg by mouth daily with breakfast. 07/24/13  Yes Thurnell Lose, MD   Cholecalciferol (VITAMIN D-3) 5000 UNITS TABS Take 5,000 Units by mouth 3 (three) times a week.     Historical Provider, MD  doxycycline (VIBRA-TABS) 100 MG tablet Take 1 tablet (100 mg total) by mouth 2 (two) times daily. Until gone 02/01/15   Delfina Redwood, MD  loperamide (IMODIUM) 2 MG capsule Take as directed for diarrhea 02/01/15   Delfina Redwood, MD   BP 118/50 mmHg  Pulse 65  Temp(Src) 98.3 F (36.8 C) (Oral)  Resp 18  Ht 5' 7"  (1.702 m)  Wt 360 lb 3.2 oz (163.386 kg)  BMI 56.40 kg/m2  SpO2 98%   Physical Exam  Constitutional: She is oriented to person, place, and time. She appears well-developed and well-nourished.  HENT:  Head: Normocephalic and atraumatic.  Right Ear: External ear normal.  Left Ear: External ear normal.  Eyes: Conjunctivae and EOM are normal. Pupils are equal, round, and reactive to light.  Neck: Normal range of motion. Neck supple.  Cardiovascular: Normal rate, regular rhythm, normal heart sounds and intact distal pulses.   Pulmonary/Chest: Effort normal and breath sounds normal.  Abdominal: Soft. Bowel sounds are normal. There is tenderness in the right lower quadrant, suprapubic area and left lower quadrant.  Musculoskeletal: Normal range of motion.  Neurological: She is alert and oriented to person, place, and time.  Skin: Skin is warm and dry. Rash (erythema, induration of pannus ) noted.  Vitals reviewed.   ED Course  Procedures (including critical care time) DIAGNOSTIC STUDIES: Oxygen Saturation is 99% on RA, normal by my interpretation.    COORDINATION OF CARE: 11:29 PM-Discussed treatment plan with pt at bedside and pt agreed to plan.     Labs Review Labs Reviewed  CBC WITH DIFFERENTIAL/PLATELET - Abnormal; Notable for the following:    RBC 3.45 (*)    Hemoglobin 10.3 (*)    HCT 31.2 (*)    All other components within normal limits  COMPREHENSIVE METABOLIC PANEL - Abnormal; Notable for the following:    CO2 21 (*)     Glucose, Bld 129 (*)    Calcium 8.8 (*)    Albumin 3.1 (*)    Total Bilirubin 2.3 (*)    All other components within normal limits  URINALYSIS, ROUTINE W REFLEX MICROSCOPIC - Abnormal; Notable for the following:    Color, Urine AMBER (*)    Hgb urine dipstick TRACE (*)    Bilirubin Urine SMALL (*)    Ketones, ur 15 (*)    Urobilinogen, UA 2.0 (*)    All other components within normal limits  URINE MICROSCOPIC-ADD ON - Abnormal; Notable for the following:    Squamous Epithelial / LPF MANY (*)    Crystals CA OXALATE CRYSTALS (*)    All other components  within normal limits  COMPREHENSIVE METABOLIC PANEL - Abnormal; Notable for the following:    Glucose, Bld 137 (*)    Calcium 8.5 (*)    Albumin 3.0 (*)    Total Bilirubin 2.3 (*)    All other components within normal limits  CBC WITH DIFFERENTIAL/PLATELET - Abnormal; Notable for the following:    WBC 3.2 (*)    RBC 3.38 (*)    Hemoglobin 10.3 (*)    HCT 30.5 (*)    Monocytes Relative 15 (*)    All other components within normal limits  PROTIME-INR - Abnormal; Notable for the following:    Prothrombin Time 15.6 (*)    All other components within normal limits  RETICULOCYTES - Abnormal; Notable for the following:    Retic Ct Pct 3.6 (*)    RBC. 3.38 (*)    All other components within normal limits  SEDIMENTATION RATE - Abnormal; Notable for the following:    Sed Rate 75 (*)    All other components within normal limits  C-REACTIVE PROTEIN - Abnormal; Notable for the following:    CRP 5.1 (*)    All other components within normal limits  CBC - Abnormal; Notable for the following:    WBC 2.4 (*)    RBC 3.29 (*)    Hemoglobin 9.8 (*)    HCT 29.7 (*)    All other components within normal limits  CULTURE, BLOOD (ROUTINE X 2)  CULTURE, BLOOD (ROUTINE X 2)  CLOSTRIDIUM DIFFICILE BY PCR  CLOSTRIDIUM DIFFICILE BY PCR  LACTIC ACID, PLASMA  MAGNESIUM  APTT  PLATELET COUNT  TYPE AND SCREEN    Imaging Review No results  found.   EKG Interpretation None      MDM   Final diagnoses:  Cellulitis of abdominal wall       59 y.o. female with pertinent PMH of ITP, obesity presents with cellulitis/panniculitis of pannus.  I saw her 2 days ago for fever with myalgias, unfortunately at that time she reported no rash and I examined the area in question today which was unremarkable.  Rash began today per pt report, fevers have resolved.  2 episodes of nausea and vomiting 2 days ago, none since.  Exam and history today consistent with cellulitis, panniculitis, do not feel pt currently has necrotizing fascitis or fournier's.  Labs and imaging as above.    Admitted in stable condition.  I have reviewed all laboratory and imaging studies if ordered as above  1. Cellulitis of abdominal wall           Hannah Freiberg, MD 02/02/15 [phone]

## 2015-01-30 NOTE — ED Notes (Signed)
Pt states Wednesday night she started feeling achy all over, running a fever 99 then 102, having headache. Pt went to [facility] after fever went up and had abd CT and other tests that she was told were all normal. Pt began having redness and warmth on rt and left side of umbilicus, pt worried it may be cellulitis since she has had it in the past. Pt reports sob, diarrhea and black stools today.

## 2015-01-30 NOTE — ED Notes (Signed)
MD at bedside. 

## 2015-01-31 ENCOUNTER — Inpatient Hospital Stay (HOSPITAL_COMMUNITY): Payer: BC Managed Care – PPO

## 2015-01-31 ENCOUNTER — Encounter (HOSPITAL_COMMUNITY): Payer: Self-pay | Admitting: *Deleted

## 2015-01-31 DIAGNOSIS — Z86010 Personal history of colonic polyps: Secondary | ICD-10-CM | POA: Diagnosis not present

## 2015-01-31 DIAGNOSIS — K7460 Unspecified cirrhosis of liver: Secondary | ICD-10-CM

## 2015-01-31 DIAGNOSIS — R0600 Dyspnea, unspecified: Secondary | ICD-10-CM

## 2015-01-31 DIAGNOSIS — M793 Panniculitis, unspecified: Secondary | ICD-10-CM | POA: Diagnosis present

## 2015-01-31 DIAGNOSIS — D61818 Other pancytopenia: Secondary | ICD-10-CM | POA: Diagnosis present

## 2015-01-31 DIAGNOSIS — I359 Nonrheumatic aortic valve disorder, unspecified: Secondary | ICD-10-CM

## 2015-01-31 DIAGNOSIS — E119 Type 2 diabetes mellitus without complications: Secondary | ICD-10-CM | POA: Diagnosis present

## 2015-01-31 DIAGNOSIS — E785 Hyperlipidemia, unspecified: Secondary | ICD-10-CM | POA: Diagnosis present

## 2015-01-31 DIAGNOSIS — R509 Fever, unspecified: Secondary | ICD-10-CM | POA: Diagnosis present

## 2015-01-31 DIAGNOSIS — K766 Portal hypertension: Secondary | ICD-10-CM | POA: Diagnosis present

## 2015-01-31 DIAGNOSIS — Z91013 Allergy to seafood: Secondary | ICD-10-CM | POA: Diagnosis not present

## 2015-01-31 DIAGNOSIS — K921 Melena: Secondary | ICD-10-CM | POA: Diagnosis present

## 2015-01-31 DIAGNOSIS — I10 Essential (primary) hypertension: Secondary | ICD-10-CM | POA: Diagnosis present

## 2015-01-31 DIAGNOSIS — Z6841 Body Mass Index (BMI) 40.0 and over, adult: Secondary | ICD-10-CM | POA: Diagnosis not present

## 2015-01-31 DIAGNOSIS — Z888 Allergy status to other drugs, medicaments and biological substances status: Secondary | ICD-10-CM | POA: Diagnosis not present

## 2015-01-31 DIAGNOSIS — Z87891 Personal history of nicotine dependence: Secondary | ICD-10-CM | POA: Diagnosis not present

## 2015-01-31 DIAGNOSIS — I358 Other nonrheumatic aortic valve disorders: Secondary | ICD-10-CM | POA: Diagnosis present

## 2015-01-31 DIAGNOSIS — G4733 Obstructive sleep apnea (adult) (pediatric): Secondary | ICD-10-CM | POA: Diagnosis present

## 2015-01-31 DIAGNOSIS — L03311 Cellulitis of abdominal wall: Secondary | ICD-10-CM | POA: Diagnosis present

## 2015-01-31 DIAGNOSIS — L0390 Cellulitis, unspecified: Secondary | ICD-10-CM | POA: Diagnosis not present

## 2015-01-31 DIAGNOSIS — Z9049 Acquired absence of other specified parts of digestive tract: Secondary | ICD-10-CM | POA: Diagnosis present

## 2015-01-31 LAB — RETICULOCYTES
RBC.: 3.38 MIL/uL — ABNORMAL LOW (ref 3.87–5.11)
RETIC COUNT ABSOLUTE: 121.7 10*3/uL (ref 19.0–186.0)
Retic Ct Pct: 3.6 % — ABNORMAL HIGH (ref 0.4–3.1)

## 2015-01-31 LAB — CBC WITH DIFFERENTIAL/PLATELET
BASOS PCT: 1 % (ref 0–1)
Basophils Absolute: 0.1 10*3/uL (ref 0.0–0.1)
Basophils Absolute: 0 10*3/uL (ref 0.0–0.1)
Basophils Relative: 0 % (ref 0–1)
EOS ABS: 0.1 10*3/uL (ref 0.0–0.7)
Eosinophils Absolute: 0.1 10*3/uL (ref 0.0–0.7)
Eosinophils Relative: 2 % (ref 0–5)
Eosinophils Relative: 2 % (ref 0–5)
HCT: 30.5 % — ABNORMAL LOW (ref 36.0–46.0)
HEMATOCRIT: 31.2 % — AB (ref 36.0–46.0)
Hemoglobin: 10.3 g/dL — ABNORMAL LOW (ref 12.0–15.0)
Hemoglobin: 10.3 g/dL — ABNORMAL LOW (ref 12.0–15.0)
LYMPHS PCT: 26 % (ref 12–46)
Lymphocytes Relative: 26 % (ref 12–46)
Lymphs Abs: 1 10*3/uL (ref 0.7–4.0)
Lymphs Abs: 0.8 10*3/uL (ref 0.7–4.0)
MCH: 29.9 pg (ref 26.0–34.0)
MCH: 30.5 pg (ref 26.0–34.0)
MCHC: 33 g/dL (ref 30.0–36.0)
MCHC: 33.8 g/dL (ref 30.0–36.0)
MCV: 90.2 fL (ref 78.0–100.0)
MCV: 90.4 fL (ref 78.0–100.0)
MONO ABS: 0.5 10*3/uL (ref 0.1–1.0)
MONOS PCT: 15 % — AB (ref 3–12)
Monocytes Absolute: 0.5 10*3/uL (ref 0.1–1.0)
Monocytes Relative: 11 % (ref 3–12)
NEUTROS ABS: 1.8 10*3/uL (ref 1.7–7.7)
NEUTROS PCT: 57 % (ref 43–77)
Neutro Abs: 2.4 10*3/uL (ref 1.7–7.7)
Neutrophils Relative %: 60 % (ref 43–77)
Platelets: DECREASED 10*3/uL (ref 150–400)
Platelets: DECREASED 10*3/uL (ref 150–400)
RBC: 3.45 MIL/uL — ABNORMAL LOW (ref 3.87–5.11)
RBC: 3.38 MIL/uL — AB (ref 3.87–5.11)
RDW: 15 % (ref 11.5–15.5)
RDW: 15.1 % (ref 11.5–15.5)
WBC: 3.2 10*3/uL — ABNORMAL LOW (ref 4.0–10.5)
WBC: 4 10*3/uL (ref 4.0–10.5)

## 2015-01-31 LAB — COMPREHENSIVE METABOLIC PANEL
ALBUMIN: 3.1 g/dL — AB (ref 3.5–5.0)
ALT: 22 U/L (ref 14–54)
ALT: 23 U/L (ref 14–54)
ANION GAP: 8 (ref 5–15)
AST: 36 U/L (ref 15–41)
AST: 39 U/L (ref 15–41)
Albumin: 3 g/dL — ABNORMAL LOW (ref 3.5–5.0)
Alkaline Phosphatase: 64 U/L (ref 38–126)
Alkaline Phosphatase: 64 U/L (ref 38–126)
Anion gap: 7 (ref 5–15)
BUN: 16 mg/dL (ref 6–20)
BUN: 18 mg/dL (ref 6–20)
CO2: 21 mmol/L — ABNORMAL LOW (ref 22–32)
CO2: 24 mmol/L (ref 22–32)
CREATININE: 0.71 mg/dL (ref 0.44–1.00)
CREATININE: 0.75 mg/dL (ref 0.44–1.00)
Calcium: 8.5 mg/dL — ABNORMAL LOW (ref 8.9–10.3)
Calcium: 8.8 mg/dL — ABNORMAL LOW (ref 8.9–10.3)
Chloride: 107 mmol/L (ref 101–111)
Chloride: 107 mmol/L (ref 101–111)
GFR calc Af Amer: >60 mL/min (ref >60)
GFR calc Af Amer: >60 mL/min (ref >60)
GFR calc non Af Amer: >60 mL/min (ref >60)
GFR calc non Af Amer: >60 mL/min (ref >60)
GLUCOSE: 129 mg/dL — AB (ref 65–99)
Glucose, Bld: 137 mg/dL — ABNORMAL HIGH (ref 65–99)
POTASSIUM: 3.8 mmol/L (ref 3.5–5.1)
Potassium: 3.9 mmol/L (ref 3.5–5.1)
Sodium: 136 mmol/L (ref 135–145)
Sodium: 138 mmol/L (ref 135–145)
Total Bilirubin: 2.3 mg/dL — ABNORMAL HIGH (ref 0.3–1.2)
Total Bilirubin: 2.3 mg/dL — ABNORMAL HIGH (ref 0.3–1.2)
Total Protein: 6.6 g/dL (ref 6.5–8.1)
Total Protein: 6.7 g/dL (ref 6.5–8.1)

## 2015-01-31 LAB — TYPE AND SCREEN
ABO/RH(D): O POS
Antibody Screen: NEGATIVE

## 2015-01-31 LAB — URINE MICROSCOPIC-ADD ON

## 2015-01-31 LAB — URINALYSIS, ROUTINE W REFLEX MICROSCOPIC
GLUCOSE, UA: NEGATIVE mg/dL
KETONES UR: 15 mg/dL — AB
Leukocytes, UA: NEGATIVE
NITRITE: NEGATIVE
Protein, ur: NEGATIVE mg/dL
Specific Gravity, Urine: 1.025 (ref 1.005–1.030)
UROBILINOGEN UA: 2 mg/dL — AB (ref 0.0–1.0)
pH: 6 (ref 5.0–8.0)

## 2015-01-31 LAB — LACTIC ACID, PLASMA: LACTIC ACID, VENOUS: 1.1 mmol/L (ref 0.5–2.0)

## 2015-01-31 LAB — PROTIME-INR
INR: 1.23 (ref 0.00–1.49)
Prothrombin Time: 15.6 seconds — ABNORMAL HIGH (ref 11.6–15.2)

## 2015-01-31 LAB — PLATELET COUNT: Platelets: DECREASED 10*3/uL (ref 150–400)

## 2015-01-31 LAB — MAGNESIUM: MAGNESIUM: 2 mg/dL (ref 1.7–2.4)

## 2015-01-31 LAB — APTT: APTT: 28 s (ref 24–37)

## 2015-01-31 LAB — C-REACTIVE PROTEIN: CRP: 5.1 mg/dL — ABNORMAL HIGH (ref <1.0)

## 2015-01-31 LAB — SEDIMENTATION RATE: Sed Rate: 75 mm/hr — ABNORMAL HIGH (ref 0–22)

## 2015-01-31 MED ORDER — FERROUS SULFATE 325 (65 FE) MG PO TABS
325.0000 mg | ORAL_TABLET | Freq: Every day | ORAL | Status: DC
  Administered 2015-01-31 – 2015-02-01 (×2): 325 mg via ORAL
  Filled 2015-01-31 (×3): qty 1

## 2015-01-31 MED ORDER — PROPRANOLOL HCL 10 MG PO TABS
10.0000 mg | ORAL_TABLET | Freq: Two times a day (BID) | ORAL | Status: DC
  Administered 2015-01-31 – 2015-02-01 (×3): 10 mg via ORAL
  Filled 2015-01-31 (×4): qty 1

## 2015-01-31 MED ORDER — SPIRONOLACTONE 25 MG PO TABS
25.0000 mg | ORAL_TABLET | Freq: Every day | ORAL | Status: DC
  Administered 2015-01-31 – 2015-02-01 (×2): 25 mg via ORAL
  Filled 2015-01-31 (×3): qty 1

## 2015-01-31 MED ORDER — ONDANSETRON HCL 4 MG/2ML IJ SOLN: 4.0000 mg | Freq: Four times a day (QID) | INTRAMUSCULAR | Status: DC | PRN

## 2015-01-31 MED ORDER — FUROSEMIDE 40 MG PO TABS
40.0000 mg | ORAL_TABLET | Freq: Every day | ORAL | Status: DC
  Administered 2015-01-31 – 2015-02-01 (×2): 40 mg via ORAL
  Filled 2015-01-31 (×3): qty 1

## 2015-01-31 MED ORDER — VITAMIN B-12 1000 MCG PO TABS
1000.0000 ug | ORAL_TABLET | Freq: Every day | ORAL | Status: DC
Start: 2015-01-31 — End: 2015-02-01
  Administered 2015-01-31 – 2015-02-01 (×2): 1000 ug via ORAL
  Filled 2015-01-31 (×2): qty 1

## 2015-01-31 MED ORDER — CLINDAMYCIN PHOSPHATE 600 MG/50ML IV SOLN
600.0000 mg | Freq: Three times a day (TID) | INTRAVENOUS | Status: DC
  Administered 2015-01-31 – 2015-02-01 (×4): 600 mg via INTRAVENOUS
  Filled 2015-01-31 (×6): qty 50

## 2015-01-31 MED ORDER — SODIUM CHLORIDE 0.9 % IJ SOLN
3.0000 mL | Freq: Two times a day (BID) | INTRAMUSCULAR | Status: DC
  Administered 2015-01-31 – 2015-02-01 (×4): 3 mL via INTRAVENOUS

## 2015-01-31 MED ORDER — ONDANSETRON HCL 4 MG PO TABS: 4.0000 mg | ORAL_TABLET | Freq: Four times a day (QID) | ORAL | Status: DC | PRN

## 2015-01-31 MED ORDER — PANTOPRAZOLE SODIUM 40 MG IV SOLR
40.0000 mg | Freq: Two times a day (BID) | INTRAVENOUS | Status: DC
  Administered 2015-01-31 (×2): 40 mg via INTRAVENOUS
  Filled 2015-01-31 (×3): qty 40

## 2015-01-31 MED ORDER — PANTOPRAZOLE SODIUM 40 MG PO TBEC
40.0000 mg | DELAYED_RELEASE_TABLET | Freq: Two times a day (BID) | ORAL | Status: DC
  Administered 2015-01-31 – 2015-02-01 (×2): 40 mg via ORAL
  Filled 2015-01-31 (×2): qty 1

## 2015-01-31 NOTE — Progress Notes (Signed)
CPAP set up at bedside on 14.0 cm H20 per patient home settings, 21% FIO2.   Patient has home tubing and nasal pillows.  Patient states she is able to place herself on/off as needed.

## 2015-01-31 NOTE — H&P (Signed)
[facility] History and Physical  Patient: Hannah Khan  MRN: [id number]  DOB: January 08, 1956  DOS: the patient was seen and examined on 01/31/2015 PCP: Lujean Amel, MD  Referring physician: Dr. Colin Rhein Chief Complaint: Fever  HPI: Hannah Khan is a 59 y.o. female with Past medical history of ITP, liver cirrhosis secondary to [place], diabetes mellitus, portal hypertension, obesity. The patient is presenting with complaints of rash. She also had fever and chills ongoing since last Wednesday. She was seen in the ER on Wednesday night for complaints of fever as well as abdominal pain and since her workup was unremarkable she was sent home. On Friday she started noticing the rash developing on her body which is progressively worsening and involving her lower part of the abdomen and therefore she came to the hospital again. She also mentions she has 5 episodes of loose watery black color bowel movement. She denies any abdominal tenderness but complains of burning sensation on her skin. She comes of dyspnea on exertion since last one week and progressively worsening. She denies any chest pain or chest heaviness. She has chronic leg swelling which is unchanged. She denies any dizziness of the time of my evaluation or any focal deficit. She mentions she is compliant with all her medications.  patient was recently treated with azithromycin for bronchitis.  The patient is coming from home And at her baseline independent for most of her ADL.  Review of Systems: as mentioned in the history of present illness.  A comprehensive review of the other systems is negative.  Past Medical History  Diagnosis Date  . Fibromyalgia   . Hx of colonic polyps   . HTN (hypertension)   . ITP (idiopathic thrombocytopenic purpura)   . Sleep apnea     uses cpap setting of 14  . Liver cirrhosis secondary to NASH (nonalcoholic steatohepatitis) dx nov 2014    had enlarged spleen also   . PONV  (postoperative nausea and vomiting)     in past none recent  . Diabetes mellitus without complication   . Hyperlipidemia   . Portal hypertension    Past Surgical History  Procedure Laterality Date  . Colectomy  2011  . Knee arthroscopy  yrs ago    bilateral, one done x 1, one done twice  . Cholecystectomy  20 yrs ago  . Cesarean section  1989  . Esophagogastroduodenoscopy (egd) with propofol N/A 09/04/2013    Procedure: ESOPHAGOGASTRODUODENOSCOPY (EGD) WITH PROPOFOL;  Surgeon: [name], MD;  Location: [facility];  Service: Endoscopy;  Laterality: N/A;   Social History:  reports that she quit smoking about 40 years ago. Her smoking use included Cigarettes. She has a 3 pack-year smoking history. She has never used smokeless tobacco. She reports that she does not drink alcohol or use illicit drugs.  Allergies  Allergen Reactions  . Celecoxib     "speech slurred" with celebrex  . Prednisone     Rapid HR  . Shellfish Allergy   . Statins     aggrevate fibromyalgia    Family History  Problem Relation Age of Onset  . Heart failure Mother   . Cancer Mother   . Hyperlipidemia Mother   . Lung cancer Father   . Cancer Father   . Cancer Brother   . Hyperlipidemia Brother   . Stroke Other     Prior to Admission medications   Medication Sig Start Date End Date Taking? Authorizing Provider  Cholecalciferol (VITAMIN D) 2000 UNITS tablet Take  2,000 Units by mouth daily.   Yes Historical Provider, MD  co-enzyme Q-10 30 MG capsule Take 180 mg by mouth daily.    Yes Historical Provider, MD  cyanocobalamin 1000 MCG tablet Take 1,000 mcg by mouth daily.   Yes Historical Provider, MD  Ferrous Sulfate (IRON) 325 (65 FE) MG TABS Take 1 tablet by mouth daily.   Yes Historical Provider, MD  furosemide (LASIX) 40 MG tablet Take 40 mg by mouth daily with breakfast. 07/24/13  Yes Thurnell Lose, MD  methocarbamol (ROBAXIN) 500 MG tablet Take 500 mg by mouth 4 (four) times daily as needed  for muscle spasms.    Yes Historical Provider, MD  Multiple Vitamins-Minerals (MULTIVITAMIN PO) Take 1 tablet by mouth daily.   Yes Historical Provider, MD  propranolol (INDERAL) 10 MG tablet Take 10 mg by mouth 2 (two) times daily.  07/02/14  Yes Historical Provider, MD  spironolactone (ALDACTONE) 25 MG tablet Take 25 mg by mouth daily with breakfast. 07/24/13  Yes Thurnell Lose, MD  Cholecalciferol (VITAMIN D-3) 5000 UNITS TABS Take 5,000 Units by mouth 3 (three) times a week.     Historical Provider, MD    Physical Exam: Filed Vitals:   01/31/15 0130 01/31/15 0200 01/31/15 0247 01/31/15 0322  BP: 107/33 124/53 140/66 125/78  Pulse: 78 76 78 81  Temp:    98.4 F (36.9 C)  TempSrc:    Oral  Resp: 17 15 22 18   Height:    5' 7"  (1.702 m)  Weight:    163.431 kg (360 lb 4.8 oz)  SpO2: 98% 98% 99% 98%    General: Alert, Awake and Oriented to Time, Place and Person. Appear in mild distress Eyes: PERRL ENT: Oral Mucosa clear moist. Neck: no JVD Cardiovascular: S1 and S2 Present,  aortic systolicMurmur, Peripheral Pulses Present Respiratory: Bilateral Air entry equal and Decreased,  Faint Crackles, no wheezes Abdomen: Bowel Sound present, Soft and non tender Skin: Macular red erythematous nonblanching Rash Extremities: bilateral Pedal edema, no calf tenderness Neurologic: Grossly no focal neuro deficit.  Labs on Admission:  CBC:  Recent Labs Lab 01/29/15 0440 01/30/15 2349  WBC 9.0 4.0  NEUTROABS 7.8* 2.4  HGB 11.6* 10.3*  HCT 35.5* 31.2*  MCV 91.5 90.4  PLT 26* PLATELET CLUMPS NOTED ON SMEAR, COUNT APPEARS DECREASED    CMP     Component Value Date/Time   NA 136 01/30/2015 2349   NA 142 08/01/2014 1509   K 3.8 01/30/2015 2349   K 4.1 08/01/2014 1509   CL 107 01/30/2015 2349   CL 107 08/03/2012 1449   CO2 21* 01/30/2015 2349   CO2 23 08/01/2014 1509   GLUCOSE 129* 01/30/2015 2349   GLUCOSE 99 08/01/2014 1509   GLUCOSE 172* 08/03/2012 1449   BUN 18 01/30/2015  2349   BUN 16.9 08/01/2014 1509   CREATININE 0.75 01/30/2015 2349   CREATININE 0.7 08/01/2014 1509   CREATININE 0.62 07/02/2014 1118   CALCIUM 8.8* 01/30/2015 2349   CALCIUM 9.3 08/01/2014 1509   PROT 6.6 01/30/2015 2349   PROT 7.0 08/01/2014 1509   ALBUMIN 3.1* 01/30/2015 2349   ALBUMIN 3.4* 08/01/2014 1509   AST 39 01/30/2015 2349   AST 54* 08/01/2014 1509   ALT 23 01/30/2015 2349   ALT 30 08/01/2014 1509   ALKPHOS 64 01/30/2015 2349   ALKPHOS 82 08/01/2014 1509   BILITOT 2.3* 01/30/2015 2349   BILITOT 1.71* 08/01/2014 1509   GFRNONAA >60 01/30/2015 2349   GFRAA >  60 01/30/2015 2349    No results for input(s): LIPASE, AMYLASE in the last 168 hours.  No results for input(s): CKTOTAL, CKMB, CKMBINDEX, TROPONINI in the last 168 hours. BNP (last 3 results) No results for input(s): BNP in the last 8760 hours.  ProBNP (last 3 results) No results for input(s): PROBNP in the last 8760 hours.   Radiological Exams on Admission: Dg Chest 2 View  01/29/2015   CLINICAL DATA:  Cold for about a week. Now shortness of breath and sudden onset fever. Weakness.  EXAM: CHEST  2 VIEW  COMPARISON:  10/17/2014  FINDINGS: Mild cardiac enlargement. Pulmonary vascularity is normal. No focal airspace disease or consolidation in the lungs. No blunting of costophrenic angles. No pneumothorax. Mediastinal contours appear intact. Degenerative changes in the spine.  IMPRESSION: No active cardiopulmonary disease.   Electronically Signed   By: Lucienne Capers M.D.   On: 01/29/2015 04:53   Ct Abdomen Pelvis W Contrast  01/29/2015   CLINICAL DATA:  Right upper quadrant pain.  Fever.  Karlene Lineman  EXAM: CT ABDOMEN AND PELVIS WITH CONTRAST  TECHNIQUE: Multidetector CT imaging of the abdomen and pelvis was performed using the standard protocol following bolus administration of intravenous contrast.  CONTRAST:  13m OMNIPAQUE IOHEXOL 300 MG/ML  SOLN  COMPARISON:  CT abdomen 07/20/2013  FINDINGS: Lung bases are clear.   Fatty infiltration of the liver. Cirrhotic liver with irregular liver capsule and enlargement of the left lobe of the liver. No focal liver mass lesion. Gallbladder surgically absent. Bile ducts nondilated. Portal hypertension with splenomegaly and numerous varices around the distal esophagus, spleen, and the portal vein. Portal vein is patent. Spleen measures 19 cm in length. Varices were present previously but appear larger on today's study. Negative for pancreatic mass or edema  Minimal ascites lateral to the liver.  No ascites in the pelvis.  Kidneys show no renal mass or obstruction or stone. Small bilateral renal cysts. Urinary bladder is normal.  Negative for bowel obstruction.  No bowel edema  IMPRESSION: Cirrhosis with portal hypertension, splenomegaly, and numerous varices. Minimal ascites.  Negative for mass or adenopathy.   Electronically Signed   By: CFranchot GalloM.D.   On: 01/29/2015 07:40    Assessment/Plan Principal Problem:   Melena Active Problems:   Obstructive sleep apnea   Cirrhosis of liver, with portal HTN & Acites, by CT Abdomen 07/20/13   Dyspnea   Pancytopenia   Cellulitis   Aortic heart murmur   1. Melena The patient is presenting with no visible 5 episodes of black color bowel movement and with dyspnea on exertion. She has history of portal gastropathy. With this findings the patient will be admitted in telemetry. I will give Protonix every 12 hours. Recheck H&H at that is a drop discuss with GI as well as with her on octreotide. Patient remains nothing by mouth except medications. Continue with the propranolol at present.  2. Fever with rash. Cellulitis. The patient appears to have cellulitis of the pannus of the abdomen. Currently she will be treated with clindamycin. Monitor cultures. ESR and CRP.  3. Dyspnea on exertion. Aortic heart murmur. History of cirrhosis. Continue with Lasix and Aldactone. Echocardiogram in the morning to rule out any  structural issues.  4. Obstructive sleep apnea. Continue BiPAP.  5. Pancytopenia. At present closely monitoring it.  6. [facility]  Advance goals of care discussion: Full code   DVT Prophylaxis: mechanical compression device  Nutrition:  Nothing by mouth except medications  Disposition: Admitted as inpatient, telemetry unit.  Author: Berle Mull, MD [facility] Pager: [phone] 01/31/2015  If 7PM-7AM, please contact night-coverage [email or web address] Password [email or web address]

## 2015-01-31 NOTE — Progress Notes (Signed)
Patient had placed self on CPAP. Patient is using hospital machine and home tubing and nasal pillows. Pt stated she was ok. RT made patient aware that if she needed any assistance to call.

## 2015-01-31 NOTE — ED Notes (Signed)
Transporting patient to news room assignment.

## 2015-01-31 NOTE — Progress Notes (Signed)
Chart reviewed. Patient examined. Had loose, but normal color (not black) stool this am. Hungry. Abdominal erythema improved. Patient is on iron. May have led to black stool. Also recently on azithromycin for upper respiratory infection. Will check stool for C. difficile. Start diet. Doubt significant GI bleed. Monitor CBCs. Continue antibiotics for cellulitis.  Doree Barthel, MD [facility]

## 2015-01-31 NOTE — Progress Notes (Signed)
Attempted report. No one answered phone in ED. Will try again in a few minutes.

## 2015-01-31 NOTE — Progress Notes (Signed)
NURSING PROGRESS NOTE  [facility] [id number] Admission Data: 01/31/2015 7:14 AM Attending Provider: Lavina Hamman, MD [id number],UEKCM, MD Code Status: Full   Hannah Khan is a 59 y.o. female patient admitted from ED:  -No acute distress noted.  -No complaints of shortness of breath.  -No complaints of chest pain.   Cardiac Monitoring: Box # 09 in place. Cardiac monitor yields:normal sinus rhythm.  Blood pressure 125/78, pulse 81, temperature 98.4 F (37.1 C), temperature source Oral, resp. rate 18, height 5' 7"  (1.702 m), weight 163.431 kg (360 lb 4.8 oz), SpO2 98 %.  IV Fluids:  IV in place, occlusive dsg intact without redness, IV cath wrist right, condition patent and no redness none.   Allergies:  Celecoxib; Prednisone; Shellfish allergy; and Statins  Past Medical History:   has a past medical history of Fibromyalgia; colonic polyps; HTN (hypertension); ITP (idiopathic thrombocytopenic purpura); Sleep apnea; Liver cirrhosis secondary to NASH (nonalcoholic steatohepatitis) (dx nov 2014); PONV (postoperative nausea and vomiting); Diabetes mellitus without complication; Hyperlipidemia; and Portal hypertension.  Past Surgical History:   has past surgical history that includes Colectomy (2011); Knee arthroscopy (yrs ago); Cholecystectomy (20 yrs ago); Cesarean section (1989); and Esophagogastroduodenoscopy (egd) with propofol (N/A, 09/04/2013).  Social History:   reports that she quit smoking about 40 years ago. Her smoking use included Cigarettes. She has a 3 pack-year smoking history. She has never used smokeless tobacco. She reports that she does not drink alcohol or use illicit drugs.  Skin: bruises on BUE  Patient/Family orientated to room. Information packet given to patient/family. Admission inpatient armband information verified with patient/family to include name and date of birth and placed on patient arm. Side rails up x 2, fall assessment and education completed  with patient/family. Patient/family able to verbalize understanding of risk associated with falls and verbalized understanding to call for assistance before getting out of bed. Call light within reach. Patient/family able to voice and demonstrate understanding of unit orientation instructions.

## 2015-02-01 DIAGNOSIS — L03311 Cellulitis of abdominal wall: Secondary | ICD-10-CM | POA: Diagnosis present

## 2015-02-01 DIAGNOSIS — D61818 Other pancytopenia: Secondary | ICD-10-CM

## 2015-02-01 LAB — CBC
HEMATOCRIT: 29.7 % — AB (ref 36.0–46.0)
HEMOGLOBIN: 9.8 g/dL — AB (ref 12.0–15.0)
MCH: 29.8 pg (ref 26.0–34.0)
MCHC: 33 g/dL (ref 30.0–36.0)
MCV: 90.3 fL (ref 78.0–100.0)
Platelets: DECREASED 10*3/uL (ref 150–400)
RBC: 3.29 MIL/uL — ABNORMAL LOW (ref 3.87–5.11)
RDW: 15 % (ref 11.5–15.5)
WBC: 2.4 10*3/uL — ABNORMAL LOW (ref 4.0–10.5)

## 2015-02-01 LAB — CLOSTRIDIUM DIFFICILE BY PCR: CDIFFPCR: NEGATIVE

## 2015-02-01 MED ORDER — LOPERAMIDE HCL 2 MG PO CAPS: ORAL_CAPSULE | ORAL | Status: DC

## 2015-02-01 MED ORDER — DOXYCYCLINE HYCLATE 100 MG PO TABS: 100.0000 mg | ORAL_TABLET | Freq: Two times a day (BID) | ORAL | Status: DC

## 2015-02-01 NOTE — Discharge Summary (Signed)
Physician Discharge Summary  Hannah Khan XLK:[id number] DOB: 1956/05/10 DOA: 01/30/2015  PCP: Lujean Amel, MD  Admit date: 01/30/2015 Discharge date: 02/01/2015  Time spent: greater than 30 minutes  Recommendations for Outpatient Follow-up:  1. GI pathogen panel pending at discharge  Discharge Diagnoses:  Principal Problem:   Abdominal wall cellulitis Active Problems:   Obstructive sleep apnea   Cirrhosis of liver, with portal HTN & Acites, by CT Abdomen 07/20/13   Dyspnea   Pancytopenia   diarrhea   Morbid obesity   Discharge Condition: stable  Diet recommendation: heart healthy  Filed Weights   01/31/15 0322 01/31/15 0619 02/01/15 0546  Weight: 163.431 kg (360 lb 4.8 oz) 163.431 kg (360 lb 4.8 oz) 163.386 kg (360 lb 3.2 oz)    History of present illness:  Hannah Khan is a 59 y.o. female with Past medical history of ITP, liver cirrhosis secondary to [place], diabetes mellitus, portal hypertension, obesity. The patient is presenting with complaints of rash. She also had fever and chills ongoing since last Wednesday. She was seen in the ER on Wednesday night for complaints of fever as well as abdominal pain and since her workup was unremarkable she was sent home. On Friday she started noticing the rash developing on her body which is progressively worsening and involving her lower part of the abdomen and therefore she came to the hospital again. She also mentions she has 5 episodes of loose watery black color bowel movement. She denies any abdominal tenderness but complains of burning sensation on her skin. She comes of dyspnea on exertion since last one week and progressively worsening. She denies any chest pain or chest heaviness. She has chronic leg swelling which is unchanged. She denies any dizziness of the time of my evaluation or any focal deficit. She mentions she is compliant with all her medications. patient was recently treated with azithromycin for  bronchitis.  Hospital Course:  Admitted to medsurg started on clindamycin for cellulitis. By discharge, cellulitis much improved, with only faint erythema inferior aspect of pannus. Afebrile and requesting discharge  Had no melena during hospitalization, and a few loose stools which ar becoming more formed.  Collected for GI pathogen panel, results pending. Tolerating solids  Other medical problems remained stable  Procedures:  none  Consultations:  none  Discharge Exam: Filed Vitals:   02/01/15 0546  BP: 118/50  Pulse: 65  Temp: 98.3 F (36.8 C)  Resp: 18    General: nontoxic Cardiovascular: RRR Respiratory: CTA  Abd. Minimal erythema inferior panus area   Discharge Instructions   Discharge Instructions    Activity as tolerated - No restrictions    Complete by:  As directed      Diet - low sodium heart healthy    Complete by:  As directed           Current Discharge Medication List    START taking these medications   Details  doxycycline (VIBRA-TABS) 100 MG tablet Take 1 tablet (100 mg total) by mouth 2 (two) times daily. Until gone Qty: 14 tablet, Refills: 0    loperamide (IMODIUM) 2 MG capsule Take as directed for diarrhea Qty: 30 capsule, Refills: 0      CONTINUE these medications which have NOT CHANGED   Details  !! Cholecalciferol (VITAMIN D) 2000 UNITS tablet Take 2,000 Units by mouth daily.    co-enzyme Q-10 30 MG capsule Take 180 mg by mouth daily.     cyanocobalamin 1000 MCG tablet Take 1,000 mcg  by mouth daily.   Associated Diagnoses: Thrombocytopenia    Ferrous Sulfate (IRON) 325 (65 FE) MG TABS Take 1 tablet by mouth daily.   Associated Diagnoses: Thrombocytopenia    furosemide (LASIX) 40 MG tablet Take 40 mg by mouth daily with breakfast.    methocarbamol (ROBAXIN) 500 MG tablet Take 500 mg by mouth 4 (four) times daily as needed for muscle spasms.     Multiple Vitamins-Minerals (MULTIVITAMIN PO) Take 1 tablet by mouth daily.     propranolol (INDERAL) 10 MG tablet Take 10 mg by mouth 2 (two) times daily.  Refills: 5   Associated Diagnoses: Thrombocytopenia    spironolactone (ALDACTONE) 25 MG tablet Take 25 mg by mouth daily with breakfast.    !! Cholecalciferol (VITAMIN D-3) 5000 UNITS TABS Take 5,000 Units by mouth 3 (three) times a week.      !! - Potential duplicate medications found. Please discuss with provider.     Allergies  Allergen Reactions  . Celecoxib     "speech slurred" with celebrex  . Prednisone     Rapid HR  . Shellfish Allergy   . Statins     aggrevate fibromyalgia   Follow-up Information    Follow up with Lujean Amel, MD.   Specialty:  Family Medicine   Why:  If symptoms worsen   Contact information:   [address] [place] [address] [phone]        The results of significant diagnostics from this hospitalization (including imaging, microbiology, ancillary and laboratory) are listed below for reference.    Significant Diagnostic Studies: Dg Chest 2 View  01/29/2015   CLINICAL DATA:  Cold for about a week. Now shortness of breath and sudden onset fever. Weakness.  EXAM: CHEST  2 VIEW  COMPARISON:  10/17/2014  FINDINGS: Mild cardiac enlargement. Pulmonary vascularity is normal. No focal airspace disease or consolidation in the lungs. No blunting of costophrenic angles. No pneumothorax. Mediastinal contours appear intact. Degenerative changes in the spine.  IMPRESSION: No active cardiopulmonary disease.   Electronically Signed   By: Lucienne Capers M.D.   On: 01/29/2015 04:53   Ct Abdomen Pelvis W Contrast  01/29/2015   CLINICAL DATA:  Right upper quadrant pain.  Fever.  Karlene Lineman  EXAM: CT ABDOMEN AND PELVIS WITH CONTRAST  TECHNIQUE: Multidetector CT imaging of the abdomen and pelvis was performed using the standard protocol following bolus administration of intravenous contrast.  CONTRAST:  146m OMNIPAQUE IOHEXOL 300 MG/ML  SOLN  COMPARISON:  CT abdomen  07/20/2013  FINDINGS: Lung bases are clear.  Fatty infiltration of the liver. Cirrhotic liver with irregular liver capsule and enlargement of the left lobe of the liver. No focal liver mass lesion. Gallbladder surgically absent. Bile ducts nondilated. Portal hypertension with splenomegaly and numerous varices around the distal esophagus, spleen, and the portal vein. Portal vein is patent. Spleen measures 19 cm in length. Varices were present previously but appear larger on today's study. Negative for pancreatic mass or edema  Minimal ascites lateral to the liver.  No ascites in the pelvis.  Kidneys show no renal mass or obstruction or stone. Small bilateral renal cysts. Urinary bladder is normal.  Negative for bowel obstruction.  No bowel edema  IMPRESSION: Cirrhosis with portal hypertension, splenomegaly, and numerous varices. Minimal ascites.  Negative for mass or adenopathy.   Electronically Signed   By: CFranchot GalloM.D.   On: 01/29/2015 07:40    Microbiology: Recent Results (from the past 240 hour(s))  Blood culture (routine x 2)     Status: None (Preliminary result)   Collection Time: 01/30/15 11:50 PM  Result Value Ref Range Status   Specimen Description BLOOD RIGHT ARM  Final   Special Requests BOTTLES DRAWN AEROBIC AND ANAEROBIC 10CC  Final   Culture   Final           BLOOD CULTURE RECEIVED NO GROWTH TO DATE CULTURE WILL BE HELD FOR 5 DAYS BEFORE ISSUING A FINAL NEGATIVE REPORT Note: Culture results may be compromised due to an excessive volume of blood received in culture bottles. Performed at [facility]    Report Status PENDING  Incomplete  Blood culture (routine x 2)     Status: None (Preliminary result)   Collection Time: 01/30/15 11:55 PM  Result Value Ref Range Status   Specimen Description BLOOD LEFT HAND  Final   Special Requests BOTTLES DRAWN AEROBIC AND ANAEROBIC 5CC  Final   Culture   Final           BLOOD CULTURE RECEIVED NO GROWTH TO DATE CULTURE WILL BE HELD  FOR 5 DAYS BEFORE ISSUING A FINAL NEGATIVE REPORT Performed at [facility]    Report Status PENDING  Incomplete     Labs: Basic Metabolic Panel:  Recent Labs Lab 01/29/15 0440 01/30/15 2349 01/31/15 0407  NA 138 136 138  K 4.6 3.8 3.9  CL 108 107 107  CO2 22 21* 24  GLUCOSE 190* 129* 137*  BUN 18 18 16   CREATININE 0.73 0.75 0.71  CALCIUM 9.1 8.8* 8.5*  MG  --   --  2.0   Liver Function Tests:  Recent Labs Lab 01/29/15 0440 01/30/15 2349 01/31/15 0407  AST 51* 39 36  ALT 27 23 22   ALKPHOS 78 64 64  BILITOT 2.0* 2.3* 2.3*  PROT 7.3 6.6 6.7  ALBUMIN 3.5 3.1* 3.0*   No results for input(s): LIPASE, AMYLASE in the last 168 hours. No results for input(s): AMMONIA in the last 168 hours. CBC:  Recent Labs Lab 01/29/15 0440 01/30/15 2349 01/31/15 0407 01/31/15 0915 02/01/15 0700  WBC 9.0 4.0 3.2*  --  2.4*  NEUTROABS 7.8* 2.4 1.8  --   --   HGB 11.6* 10.3* 10.3*  --  9.8*  HCT 35.5* 31.2* 30.5*  --  29.7*  MCV 91.5 90.4 90.2  --  90.3  PLT 26* PLATELET CLUMPS NOTED ON SMEAR, COUNT APPEARS DECREASED PLATELET CLUMPS NOTED ON SMEAR, COUNT APPEARS DECREASED PLATELET CLUMPS NOTED ON SMEAR, COUNT APPEARS DECREASED 23*   Cardiac Enzymes: No results for input(s): CKTOTAL, CKMB, CKMBINDEX, TROPONINI in the last 168 hours. BNP: BNP (last 3 results) No results for input(s): BNP in the last 8760 hours.  ProBNP (last 3 results) No results for input(s): PROBNP in the last 8760 hours.  CBG: No results for input(s): GLUCAP in the last 168 hours.     SignedDelfina Redwood  [facility] 02/01/2015, 10:37 AM

## 2015-02-01 NOTE — Progress Notes (Signed)
Discharge education completed by RN. Pt and spouse received a copy of discharge paperwork and confirm understanding of follow up appointments and discharge medications. Both deny any questions at this time. IV removed, site is within normal limits. Pt will discharge from the unit via wheelchair. 

## 2015-02-06 LAB — CULTURE, BLOOD (ROUTINE X 2)
Culture: NO GROWTH
Culture: NO GROWTH

## 2015-03-26 ENCOUNTER — Other Ambulatory Visit (HOSPITAL_COMMUNITY)
Admission: RE | Admit: 2015-03-26 | Discharge: 2015-03-26 | Disposition: A | Discharge status: Home / Self Care | Payer: BC Managed Care – PPO | Source: Ambulatory Visit | Attending: Nurse Practitioner | Admitting: Nurse Practitioner

## 2015-03-26 ENCOUNTER — Other Ambulatory Visit: Payer: Self-pay | Admitting: Nurse Practitioner

## 2015-03-26 DIAGNOSIS — Z01419 Encounter for gynecological examination (general) (routine) without abnormal findings: Secondary | ICD-10-CM | POA: Diagnosis not present

## 2015-03-26 DIAGNOSIS — Z1151 Encounter for screening for human papillomavirus (HPV): Secondary | ICD-10-CM | POA: Insufficient documentation

## 2015-03-27 LAB — CYTOLOGY - PAP

## 2015-09-03 ENCOUNTER — Other Ambulatory Visit: Payer: Self-pay | Admitting: Gastroenterology

## 2015-09-03 DIAGNOSIS — K7460 Unspecified cirrhosis of liver: Secondary | ICD-10-CM

## 2015-09-03 DIAGNOSIS — C220 Liver cell carcinoma: Secondary | ICD-10-CM

## 2015-09-15 ENCOUNTER — Ambulatory Visit
Admission: RE | Admit: 2015-09-15 | Discharge: 2015-09-15 | Disposition: A | Discharge status: Home / Self Care | Payer: BC Managed Care – PPO | Source: Ambulatory Visit | Attending: Gastroenterology | Admitting: Gastroenterology

## 2015-09-15 DIAGNOSIS — C220 Liver cell carcinoma: Secondary | ICD-10-CM

## 2015-09-15 DIAGNOSIS — K7460 Unspecified cirrhosis of liver: Secondary | ICD-10-CM

## 2015-11-02 ENCOUNTER — Other Ambulatory Visit: Payer: Self-pay | Admitting: Family Medicine

## 2015-11-02 ENCOUNTER — Ambulatory Visit
Admission: RE | Admit: 2015-11-02 | Discharge: 2015-11-02 | Disposition: A | Discharge status: Home / Self Care | Payer: BC Managed Care – PPO | Source: Ambulatory Visit | Attending: Family Medicine | Admitting: Family Medicine

## 2015-11-02 DIAGNOSIS — R519 Headache, unspecified: Secondary | ICD-10-CM

## 2015-11-02 DIAGNOSIS — R51 Headache: Principal | ICD-10-CM

## 2015-12-04 ENCOUNTER — Telehealth: Payer: Self-pay | Admitting: Oncology

## 2015-12-04 NOTE — Telephone Encounter (Signed)
s.w. pt and advised on appt...emailed md to ask if labs needed

## 2015-12-08 ENCOUNTER — Other Ambulatory Visit: Payer: Self-pay

## 2015-12-08 ENCOUNTER — Encounter (HOSPITAL_COMMUNITY): Payer: Self-pay

## 2015-12-08 ENCOUNTER — Telehealth: Payer: Self-pay | Admitting: Oncology

## 2015-12-08 ENCOUNTER — Inpatient Hospital Stay (HOSPITAL_COMMUNITY)
Admission: EM | Admit: 2015-12-08 | Discharge: 2015-12-12 | DRG: 309 | Disposition: A | Discharge status: Home / Self Care | Payer: BC Managed Care – PPO | Attending: Internal Medicine | Admitting: Internal Medicine

## 2015-12-08 ENCOUNTER — Emergency Department (HOSPITAL_COMMUNITY): Payer: BC Managed Care – PPO

## 2015-12-08 ENCOUNTER — Ambulatory Visit (HOSPITAL_BASED_OUTPATIENT_CLINIC_OR_DEPARTMENT_OTHER): Payer: BC Managed Care – PPO | Admitting: Oncology

## 2015-12-08 VITALS — BP 124/63 | HR 136 | Temp 97.6°F | Resp 18 | Ht 67.0 in | Wt 359.0 lb

## 2015-12-08 DIAGNOSIS — Z79899 Other long term (current) drug therapy: Secondary | ICD-10-CM

## 2015-12-08 DIAGNOSIS — G4733 Obstructive sleep apnea (adult) (pediatric): Secondary | ICD-10-CM | POA: Diagnosis present

## 2015-12-08 DIAGNOSIS — R188 Other ascites: Secondary | ICD-10-CM | POA: Diagnosis present

## 2015-12-08 DIAGNOSIS — K766 Portal hypertension: Secondary | ICD-10-CM | POA: Diagnosis present

## 2015-12-08 DIAGNOSIS — Z87891 Personal history of nicotine dependence: Secondary | ICD-10-CM | POA: Diagnosis not present

## 2015-12-08 DIAGNOSIS — Z6841 Body Mass Index (BMI) 40.0 and over, adult: Secondary | ICD-10-CM | POA: Diagnosis not present

## 2015-12-08 DIAGNOSIS — K7460 Unspecified cirrhosis of liver: Secondary | ICD-10-CM

## 2015-12-08 DIAGNOSIS — E119 Type 2 diabetes mellitus without complications: Secondary | ICD-10-CM | POA: Diagnosis present

## 2015-12-08 DIAGNOSIS — D696 Thrombocytopenia, unspecified: Secondary | ICD-10-CM

## 2015-12-08 DIAGNOSIS — M797 Fibromyalgia: Secondary | ICD-10-CM | POA: Diagnosis present

## 2015-12-08 DIAGNOSIS — I959 Hypotension, unspecified: Secondary | ICD-10-CM | POA: Diagnosis present

## 2015-12-08 DIAGNOSIS — I4891 Unspecified atrial fibrillation: Principal | ICD-10-CM | POA: Diagnosis present

## 2015-12-08 DIAGNOSIS — E785 Hyperlipidemia, unspecified: Secondary | ICD-10-CM | POA: Diagnosis present

## 2015-12-08 DIAGNOSIS — D6949 Other primary thrombocytopenia: Secondary | ICD-10-CM | POA: Diagnosis present

## 2015-12-08 DIAGNOSIS — I517 Cardiomegaly: Secondary | ICD-10-CM | POA: Diagnosis present

## 2015-12-08 DIAGNOSIS — F419 Anxiety disorder, unspecified: Secondary | ICD-10-CM | POA: Diagnosis present

## 2015-12-08 DIAGNOSIS — I119 Hypertensive heart disease without heart failure: Secondary | ICD-10-CM | POA: Diagnosis present

## 2015-12-08 DIAGNOSIS — R000 Tachycardia, unspecified: Secondary | ICD-10-CM | POA: Diagnosis not present

## 2015-12-08 DIAGNOSIS — I10 Essential (primary) hypertension: Secondary | ICD-10-CM | POA: Diagnosis not present

## 2015-12-08 DIAGNOSIS — I4892 Unspecified atrial flutter: Secondary | ICD-10-CM | POA: Diagnosis present

## 2015-12-08 DIAGNOSIS — I480 Paroxysmal atrial fibrillation: Secondary | ICD-10-CM | POA: Diagnosis not present

## 2015-12-08 DIAGNOSIS — K7031 Alcoholic cirrhosis of liver with ascites: Secondary | ICD-10-CM | POA: Diagnosis not present

## 2015-12-08 DIAGNOSIS — I483 Typical atrial flutter: Secondary | ICD-10-CM

## 2015-12-08 DIAGNOSIS — K7469 Other cirrhosis of liver: Secondary | ICD-10-CM | POA: Diagnosis present

## 2015-12-08 DIAGNOSIS — I4819 Other persistent atrial fibrillation: Secondary | ICD-10-CM | POA: Diagnosis present

## 2015-12-08 DIAGNOSIS — K7581 Nonalcoholic steatohepatitis (NASH): Secondary | ICD-10-CM | POA: Diagnosis present

## 2015-12-08 LAB — COMPREHENSIVE METABOLIC PANEL
ALK PHOS: 62 U/L (ref 38–126)
ALT: 25 U/L (ref 14–54)
AST: 45 U/L — ABNORMAL HIGH (ref 15–41)
Albumin: 3.6 g/dL (ref 3.5–5.0)
Anion gap: 8 (ref 5–15)
BILIRUBIN TOTAL: 2.1 mg/dL — AB (ref 0.3–1.2)
BUN: 18 mg/dL (ref 6–20)
CALCIUM: 9.6 mg/dL (ref 8.9–10.3)
CO2: 26 mmol/L (ref 22–32)
CREATININE: 0.73 mg/dL (ref 0.44–1.00)
Chloride: 107 mmol/L (ref 101–111)
GFR calc non Af Amer: >60 mL/min (ref >60)
GLUCOSE: 112 mg/dL — AB (ref 65–99)
Potassium: 4.2 mmol/L (ref 3.5–5.1)
SODIUM: 141 mmol/L (ref 135–145)
TOTAL PROTEIN: 7.5 g/dL (ref 6.5–8.1)

## 2015-12-08 LAB — CBC WITH DIFFERENTIAL/PLATELET
BASOS PCT: 1 %
Basophils Absolute: 0 10*3/uL (ref 0.0–0.1)
EOS ABS: 0.1 10*3/uL (ref 0.0–0.7)
EOS PCT: 1 %
HCT: 37.1 % (ref 36.0–46.0)
HEMOGLOBIN: 12.6 g/dL (ref 12.0–15.0)
LYMPHS ABS: 1.1 10*3/uL (ref 0.7–4.0)
Lymphocytes Relative: 25 %
MCH: 30.4 pg (ref 26.0–34.0)
MCHC: 34 g/dL (ref 30.0–36.0)
MCV: 89.4 fL (ref 78.0–100.0)
MONO ABS: 0.4 10*3/uL (ref 0.1–1.0)
MONOS PCT: 10 %
NEUTROS PCT: 63 %
Neutro Abs: 2.8 10*3/uL (ref 1.7–7.7)
Platelets: 25 10*3/uL — CL (ref 150–400)
RBC: 4.15 MIL/uL (ref 3.87–5.11)
RDW: 15.3 % (ref 11.5–15.5)
WBC: 4.4 10*3/uL (ref 4.0–10.5)

## 2015-12-08 LAB — TROPONIN I

## 2015-12-08 LAB — PROTIME-INR
INR: 1.25 (ref 0.00–1.49)
Prothrombin Time: 15.9 seconds — ABNORMAL HIGH (ref 11.6–15.2)

## 2015-12-08 LAB — PHOSPHORUS: Phosphorus: 3.7 mg/dL (ref 2.5–4.6)

## 2015-12-08 LAB — I-STAT TROPONIN, ED: TROPONIN I, POC: 0.01 ng/mL (ref 0.00–0.08)

## 2015-12-08 LAB — APTT: aPTT: 34 seconds (ref 24–37)

## 2015-12-08 LAB — MAGNESIUM: Magnesium: 2 mg/dL (ref 1.7–2.4)

## 2015-12-08 MED ORDER — CO Q 10 100 MG PO CAPS: 100.0000 mg | ORAL_CAPSULE | Freq: Every day | ORAL | Status: DC

## 2015-12-08 MED ORDER — VITAMIN B-12 1000 MCG PO TABS
1000.0000 ug | ORAL_TABLET | Freq: Every day | ORAL | Status: DC
  Administered 2015-12-09 – 2015-12-12 (×4): 1000 ug via ORAL
  Filled 2015-12-08 (×4): qty 1

## 2015-12-08 MED ORDER — PROPRANOLOL HCL 10 MG PO TABS
10.0000 mg | ORAL_TABLET | Freq: Two times a day (BID) | ORAL | Status: DC
  Administered 2015-12-08 – 2015-12-12 (×8): 10 mg via ORAL
  Filled 2015-12-08 (×12): qty 1

## 2015-12-08 MED ORDER — DILTIAZEM HCL 100 MG IV SOLR
5.0000 mg/h | INTRAVENOUS | Status: DC
  Administered 2015-12-08 – 2015-12-09 (×2): 15 mg/h via INTRAVENOUS
  Filled 2015-12-08 (×2): qty 100

## 2015-12-08 MED ORDER — SODIUM CHLORIDE 0.9 % IV SOLN
INTRAVENOUS | Status: DC
  Administered 2015-12-08: 22:00:00 via INTRAVENOUS

## 2015-12-08 MED ORDER — DILTIAZEM HCL 100 MG IV SOLR
5.0000 mg/h | Freq: Once | INTRAVENOUS | Status: AC
  Administered 2015-12-08: 5 mg/h via INTRAVENOUS
  Filled 2015-12-08 (×2): qty 100

## 2015-12-08 MED ORDER — SODIUM CHLORIDE 0.9% FLUSH
3.0000 mL | Freq: Two times a day (BID) | INTRAVENOUS | Status: DC
  Administered 2015-12-10 – 2015-12-11 (×4): 3 mL via INTRAVENOUS

## 2015-12-08 MED ORDER — LEVALBUTEROL HCL 0.63 MG/3ML IN NEBU: 0.6300 mg | INHALATION_SOLUTION | Freq: Four times a day (QID) | RESPIRATORY_TRACT | Status: DC | PRN

## 2015-12-08 MED ORDER — HEPARIN (PORCINE) IN NACL 100-0.45 UNIT/ML-% IJ SOLN
1450.0000 [IU]/h | INTRAMUSCULAR | Status: DC
  Filled 2015-12-08: qty 250

## 2015-12-08 MED ORDER — VITAMIN D3 25 MCG (1000 UNIT) PO TABS
2000.0000 [IU] | ORAL_TABLET | Freq: Every day | ORAL | Status: DC
  Administered 2015-12-09 – 2015-12-12 (×4): 2000 [IU] via ORAL
  Filled 2015-12-08 (×12): qty 2

## 2015-12-08 MED ORDER — DILTIAZEM HCL 25 MG/5ML IV SOLN
10.0000 mg | Freq: Once | INTRAVENOUS | Status: AC
  Administered 2015-12-08: 10 mg via INTRAVENOUS
  Filled 2015-12-08: qty 5

## 2015-12-08 MED ORDER — LEVALBUTEROL HCL 0.63 MG/3ML IN NEBU
0.6300 mg | INHALATION_SOLUTION | Freq: Four times a day (QID) | RESPIRATORY_TRACT | Status: DC
Start: 2015-12-08 — End: 2015-12-08
  Filled 2015-12-08: qty 3

## 2015-12-08 MED ORDER — FERROUS SULFATE 325 (65 FE) MG PO TABS
ORAL_TABLET | Freq: Every day | ORAL | Status: DC
  Administered 2015-12-08 – 2015-12-12 (×5): 325 mg via ORAL
  Filled 2015-12-08 (×5): qty 1

## 2015-12-08 MED ORDER — SPIRONOLACTONE 25 MG PO TABS
25.0000 mg | ORAL_TABLET | Freq: Every day | ORAL | Status: DC
  Administered 2015-12-08 – 2015-12-12 (×5): 25 mg via ORAL
  Filled 2015-12-08 (×5): qty 1

## 2015-12-08 NOTE — Progress Notes (Signed)
Set up CPAP at patient's bedside, patient states she will place it on when ready. RT will continue to monitor as needed.

## 2015-12-08 NOTE — Progress Notes (Signed)
ANTICOAGULATION CONSULT NOTE - Initial Consult  Pharmacy Consult for Heparin Indication: atrial fibrillation  Allergies  Allergen Reactions  . Celecoxib Other (See Comments)    "speech slurred" with celebrex  . Prednisone Other (See Comments)    Rapid heart rate  . Shellfish Allergy Swelling    Lips tingle and swell. Whelps the size of quarters.   . Statins Other (See Comments)    aggrevate fibromyalgia    Patient Measurements:   Heparin Dosing Weight: 102 kg   Vital Signs: Temp: 97.8 F (36.6 C) (03/21 1534) Temp Source: Oral (03/21 1534) BP: 127/72 mmHg (03/21 1719) Pulse Rate: 136 (03/21 1719)  Labs:  Recent Labs  12/08/15 1551  HGB 12.6  HCT 37.1  PLT 25*  APTT 34  LABPROT 15.9*  INR 1.25  CREATININE 0.73    Estimated Creatinine Clearance: 122 mL/min (by C-G formula based on Cr of 0.73).   Medical History: Past Medical History  Diagnosis Date  . Fibromyalgia   . Hx of colonic polyps   . HTN (hypertension)   . ITP (idiopathic thrombocytopenic purpura)   . Sleep apnea     uses cpap setting of 14  . Liver cirrhosis secondary to NASH (nonalcoholic steatohepatitis) dx nov 2014    had enlarged spleen also   . PONV (postoperative nausea and vomiting)     in past none recent  . Diabetes mellitus without complication ([place])   . Hyperlipidemia   . Portal hypertension (HCC)     Medications:  Scheduled:   Infusions:  . heparin      Assessment:  60 yr female sent from [facility] to ED due to tachycardia.  Labs checked at [facility] revealed PLTC = 25 K.  Thrombocytopenia noted to be related to cirrhosis of the liver.  No bleeding noted.  EKG notes AFlutter with RVR  Pharmacy consulted to dose IV heparin  Goal of Therapy:  Heparin level 0.3-0.7 units/ml Monitor platelets by anticoagulation protocol: Yes   Plan:   No heparin bolus due to low Platelet Count  Begin IV heparin infusion @ 1450 units/hr  Check heparin level 6 hr after heparin  started  Follow heparin level & CBC daily  Ahlana Slaydon, Toribio Harbour, PharmD 12/08/2015,5:59 PM

## 2015-12-08 NOTE — ED Notes (Signed)
[facility] called for transport. 

## 2015-12-08 NOTE — H&P (Signed)
Patient Demographics  Hannah Khan, is a 60 y.o. female  MRN: [id number]   DOB - [birth year]  Admit Date - 12/08/2015  Outpatient Primary MD for the patient is Lujean Amel, MD   With History of -  Past Medical History  Diagnosis Date  . Fibromyalgia   . Hx of colonic polyps   . HTN (hypertension)   . ITP (idiopathic thrombocytopenic purpura)   . Sleep apnea     uses cpap setting of 14  . Liver cirrhosis secondary to NASH (nonalcoholic steatohepatitis) dx nov 2014    had enlarged spleen also   . PONV (postoperative nausea and vomiting)     in past none recent  . Diabetes mellitus without complication ([place])   . Hyperlipidemia   . Portal hypertension (HCC)       Past Surgical History  Procedure Laterality Date  . Colectomy  2011  . Knee arthroscopy  yrs ago    bilateral, one done x 1, one done twice  . Cholecystectomy  20 yrs ago  . Cesarean section  1989  . Esophagogastroduodenoscopy (egd) with propofol N/A 09/04/2013    Procedure: ESOPHAGOGASTRODUODENOSCOPY (EGD) WITH PROPOFOL;  Surgeon: [name], MD;  Location: [facility] ENDOSCOPY;  Service: Endoscopy;  Laterality: N/A;    in for   Chief Complaint  Patient presents with  . Tachycardia     HPI  Hannah Khan  is a 60 y.o. female , Thrombocytopenia, liver cirrhosis secondary to [place], diabetes mellitus, portal hypertension, obesity, OSA, patient reports she woke his a.m. with "feeling funny", reports to tartrate on a monitor, was in 130s, with for regular blood work at her hematology's office for follow with you in follow-up on her platelet count, in Dr Alen Blew office she was found to be in A. fib with RVR, she was sent to ED, NAD patient was to be with heart rate in the 135, started on Cardizem drip, he denies any chest pain, shortness of breath dizziness, lightheadedness, syncope or near syncope, significant left to right abnormality, potassium and magnesium within normal limits, workup was significant for  thrombocytopenia with platelet count of 20 5K which is her baseline, ED discussed with Dr. Oval Linsey from cardiology , requested patient to be admitted to [facility] cone stepdown unit, as she might need TEE with cardioversion, I was called to admit.    Review of Systems    In addition to the HPI above,  No Fever-chills, No Headache, No changes with Vision or hearing, No problems swallowing food or Liquids, No Chest pain, Cough or Shortness of Breath, No Abdominal pain, No Nausea or Vommitting, Bowel movements are regular, No Blood in stool or Urine, No dysuria, No new skin rashes or bruises, No new joints pains-aches,  No new weakness, tingling, numbness in any extremity, No recent weight gain or loss, No polyuria, polydypsia or polyphagia, No significant Mental Stressors.  A full 10 point Review of Systems was done, except as stated above, all other Review of Systems were negative.   Social History Social History  Substance Use Topics  . Smoking status: Former Smoker -- 1.00 packs/day for 3 years    Types: Cigarettes    Quit date: 09/19/1974  . Smokeless tobacco: Never Used  . Alcohol Use: No     Family History Family History  Problem Relation Age of Onset  . Heart failure Mother   . Cancer Mother   . Hyperlipidemia Mother   . Lung cancer Father   . Cancer Father   .  Cancer Brother   . Hyperlipidemia Brother   . Stroke Other      Prior to Admission medications   Medication Sig Start Date End Date Taking? Authorizing Provider  acetaminophen (TYLENOL) 500 MG tablet Take 1,000 mg by mouth every 6 (six) hours as needed for moderate pain or headache.   Yes Historical Provider, MD  Cholecalciferol (VITAMIN D) 2000 UNITS tablet Take 2,000 Units by mouth daily.   Yes Historical Provider, MD  Coenzyme Q10 (CO Q 10) 100 MG CAPS Take 100 mg by mouth daily.   Yes Historical Provider, MD  cyanocobalamin 1000 MCG tablet Take 1,000 mcg by mouth daily.   Yes Historical Provider, MD    Ferrous Sulfate (IRON) 325 (65 FE) MG TABS Take 1 tablet by mouth daily.   Yes Historical Provider, MD  furosemide (LASIX) 40 MG tablet Take 40 mg by mouth daily with breakfast. 07/24/13  Yes Thurnell Lose, MD  methocarbamol (ROBAXIN) 500 MG tablet Take 250-750 mg by mouth daily as needed for muscle spasms.    Yes Historical Provider, MD  Multiple Vitamins-Minerals (MULTIVITAMIN PO) Take 1 tablet by mouth daily.   Yes Historical Provider, MD  propranolol (INDERAL) 10 MG tablet Take 10 mg by mouth 2 (two) times daily.  07/02/14  Yes Historical Provider, MD  spironolactone (ALDACTONE) 25 MG tablet Take 25 mg by mouth daily.  07/24/13  Yes Thurnell Lose, MD    Allergies  Allergen Reactions  . Celecoxib Other (See Comments)    "speech slurred" with celebrex  . Prednisone Other (See Comments)    Rapid heart rate  . Shellfish Allergy Swelling    Lips tingle and swell. Whelps the size of quarters.   . Statins Other (See Comments)    aggrevate fibromyalgia    Physical Exam  Vitals  Blood pressure 124/82, pulse 137, temperature 97.8 F (36.6 C), temperature source Oral, resp. rate 17, SpO2 99 %.   1. General obese female lying in bed in NAD,   2. Normal affect and insight, Not Suicidal or Homicidal, Awake Alert, Oriented X 3.  3. No F.N deficits, ALL C.Nerves Intact, Strength 5/5 all 4 extremities, Sensation intact all 4 extremities, Plantars down going.  4. Ears and Eyes appear Normal, Conjunctivae clear, PERRLA. Moist Oral Mucosa.  5. Supple Neck, No JVD, No cervical lymphadenopathy appriciated, No Carotid Bruits.  6. Symmetrical Chest wall movement, Good air movement bilaterally, CTAB.  7. Tachycardic, No Gallops, Rubs , No Parasternal Heave.  8. Positive Bowel Sounds, Abdomen Soft, No tenderness, No organomegaly appriciated,No rebound -guarding or rigidity.  9.  No Cyanosis, Normal Skin Turgor, chronic lower extremity skin dark discoloration, +1 edema B/L.  10. Good  muscle tone,  joints appear normal , no effusions, Normal ROM.  11. No Palpable Lymph Nodes in Neck or Axillae   Data Review  CBC  Recent Labs Lab 12/08/15 1551  WBC 4.4  HGB 12.6  HCT 37.1  PLT 25*  MCV 89.4  MCH 30.4  MCHC 34.0  RDW 15.3  LYMPHSABS 1.1  MONOABS 0.4  EOSABS 0.1  BASOSABS 0.0   ------------------------------------------------------------------------------------------------------------------  Chemistries   Recent Labs Lab 12/08/15 1551  NA 141  K 4.2  CL 107  CO2 26  GLUCOSE 112*  BUN 18  CREATININE 0.73  CALCIUM 9.6  MG 2.0  AST 45*  ALT 25  ALKPHOS 62  BILITOT 2.1*   ------------------------------------------------------------------------------------------------------------------ estimated creatinine clearance is 122 mL/min (by C-G formula based on Cr of  0.73). ------------------------------------------------------------------------------------------------------------------ No results for input(s): TSH, T4TOTAL, T3FREE, THYROIDAB in the last 72 hours.  Invalid input(s): FREET3   Coagulation profile  Recent Labs Lab 12/08/15 1551  INR 1.25   ------------------------------------------------------------------------------------------------------------------- No results for input(s): DDIMER in the last 72 hours. -------------------------------------------------------------------------------------------------------------------  Cardiac Enzymes No results for input(s): CKMB, TROPONINI, MYOGLOBIN in the last 168 hours.  Invalid input(s): CK ------------------------------------------------------------------------------------------------------------------ Invalid input(s): POCBNP   ---------------------------------------------------------------------------------------------------------------  Urinalysis    Component Value Date/Time   COLORURINE AMBER* 01/31/2015 0055   APPEARANCEUR CLEAR 01/31/2015 0055   LABSPEC 1.025 01/31/2015  0055   PHURINE 6.0 01/31/2015 0055   GLUCOSEU NEGATIVE 01/31/2015 0055   HGBUR TRACE* 01/31/2015 0055   BILIRUBINUR SMALL* 01/31/2015 0055   KETONESUR 15* 01/31/2015 0055   PROTEINUR NEGATIVE 01/31/2015 0055   UROBILINOGEN 2.0* 01/31/2015 0055   NITRITE NEGATIVE 01/31/2015 0055   LEUKOCYTESUR NEGATIVE 01/31/2015 0055    ----------------------------------------------------------------------------------------------------------------  Imaging results:   Dg Chest Portable 1 View  12/08/2015  CLINICAL DATA:  Tachycardia.  Hypertension. EXAM: PORTABLE CHEST 1 VIEW COMPARISON:  Jan 29, 2015 FINDINGS: There is no edema or consolidation. There is cardiomegaly. Pulmonary vascularity is normal. No adenopathy. No bone lesions. IMPRESSION: Moderate generalized cardiac enlargement. No edema or consolidation. Electronically Signed   By: Lowella Grip III M.D.   On: 12/08/2015 16:12    My personal review of EKG: Rhythm A flutter with 2-1 blockate 139  /min, QTc 393, no Acute ST changes    Assessment & Plan  Active Problems:   Primary thrombocytopenia (HCC)   Obstructive sleep apnea   DIABETES MELLITUS, BORDERLINE   Cirrhosis of liver, with portal HTN & Acites, by CT Abdomen 07/20/13   Atrial fibrillation (HCC)  Atrial fibrillation/atrial flutter - Discussed with cardiology, appears to be a flutter with 2-1 block -They recommend patient to be transferred to [facility] cone stepdown for further workup, as she might need TEE with cardioversion - continue with Cardizem drip for now - hold on anticoagulation given her thrombocytopenia, as well as her chadsVasc2score is 2. - Check 2-D echo ,and  cycle cardiac enzymes  Primary thrombocytopenia - Appears to be chronic, at baseline, no evidence of active bleed, followed by Dr. Osker Mason from hematology  - Felt to be secondary to liver cirrhosis  Liver cirrhosis due to [facility] with portal hypertension - Does not appear to be encephalopathic, LFTs within  normal limits, INR and PTT with normal limits - continue with home medication including Aldactone and propranolol  Diabetes mellitus, borderline -She reports is controlled with diet, most recent A1c at PCP office was 5.7 - We'll continue with CABG, if elevated will start on insulin sliding scale  Obstructive sleep apnea - Continue C Pap  DVT Prophylaxis  SCDs   AM Labs Ordered, also please review Full Orders  Family Communication: Admission, patients condition and plan of care including tests being ordered have been discussed with the patient and husband who indicate understanding and agree with the plan and Code Status.  Code Status full  Likely DC to  home once stable  Condition GUARDED    Time spent in minutes : 55 minutes    Ismerai Bin M.D on 12/08/2015 at 6:32 PM  Between 7am to 7pm - Pager - [phone]  After 7pm go to [email or web address] - password [email or web address]  And look for the night coverage person covering me after hours  [facility] Office  [phone]

## 2015-12-08 NOTE — ED Notes (Signed)
RN made aware of critical platelet count

## 2015-12-08 NOTE — Progress Notes (Signed)
Hematology and Oncology Follow Up Visit  Hannah Khan [id number] 1956-01-12 60 y.o. 12/08/2015 3:05 PM  CC: [name], MD [name], M.D. Leighton Ruff. Redmond Pulling, MD Bo Merino, M.D.    Principle Diagnosis: This is a 60 year old female with the following issues:   1. Thrombocytopenia related to cirrhosis of the liver as well as platelet clumping. She is currently on observation and surveillance. 2.     Status post a partial colectomy due to a tubular adenoma, without any evidence of high-grade dysplasia or malignancy.   Current therapy: Observation, surveillance  Interim History: Hannah Khan presents today for a follow-up visit. Since her last visit, she reports doing fairly well without any major issues. She recently been on a cruise and denied any issues other than minor seasickness. She denied any recent bleeding episodes. She denied any epistaxis, hematochezia or melena. She continues to work full-time without any decline.   She was feeling fairly well though she was woken up at 3 AM this morning with palpitation. She was having a dream and when she woke up she felt heart fluttering associated with some mild dyspnea and dizziness. She checked her pulse rate and was around 130. Her blood pressure has been normal. She denied any cardiac histories or arrhythmias.  She does not report any headaches, blurry vision, syncope or seizures. She has not reported any decline in her energy her performance status. She does not report any fevers or chills or sweats.She has not reported any chest pain or shortness of breath. She does not report any nausea, vomiting or abdominal pain. Does not report any cough or hemoptysis.rest of her review of systems unremarkable.  Medications: I have reviewed the patient's current medications.  Current Outpatient Prescriptions  Medication Sig Dispense Refill  . Cholecalciferol (VITAMIN D) 2000 UNITS tablet Take 2,000 Units by mouth daily.    .  Cholecalciferol (VITAMIN D-3) 5000 UNITS TABS Take 5,000 Units by mouth 3 (three) times a week.     . co-enzyme Q-10 30 MG capsule Take 180 mg by mouth daily.     . cyanocobalamin 1000 MCG tablet Take 1,000 mcg by mouth daily.    . Ferrous Sulfate (IRON) 325 (65 FE) MG TABS Take 1 tablet by mouth daily.    . furosemide (LASIX) 40 MG tablet Take 40 mg by mouth daily with breakfast.    . methocarbamol (ROBAXIN) 500 MG tablet Take 500 mg by mouth 4 (four) times daily as needed for muscle spasms.     . Multiple Vitamins-Minerals (MULTIVITAMIN PO) Take 1 tablet by mouth daily.    . propranolol (INDERAL) 10 MG tablet Take 10 mg by mouth 2 (two) times daily.   5  . spironolactone (ALDACTONE) 25 MG tablet Take 25 mg by mouth daily with breakfast.     No current facility-administered medications for this visit.    Allergies:  Allergies  Allergen Reactions  . Celecoxib Other (See Comments)    "speech slurred" with celebrex  . Prednisone Other (See Comments)    Rapid heart rate  . Shellfish Allergy Swelling    Lips tingle and swell. Whelps the size of quarters.   . Statins Other (See Comments)    aggrevate fibromyalgia    Past Medical History, Surgical history, Social history, and Family History were reviewed and updated.   Physical Exam: Blood pressure 124/63, pulse 136, temperature 97.6 F (36.4 C), temperature source Oral, resp. rate 18, height 5' 7"  (1.702 m), weight 359 lb (162.841 kg),  SpO2 99 %. ECOG: 1 General appearance: alert appeared without distress. Head: Normocephalic, without obvious abnormality no oral ulcers or lesions. Neck: no adenopathy Lymph nodes: Cervical, supraclavicular, and axillary nodes normal. Heart: Tachycardic without murmurs. Lung:chest clear, no wheezing, rales, normal symmetric air entry Abdomen: soft, non-tender, without masses or organomegaly EXT:no erythema, induration, or nodules. Skin: No petechia noted. No ecchymosis.   Lab Results: Lab  Results  Component Value Date   WBC 2.4* 02/01/2015   HGB 9.8* 02/01/2015   HCT 29.7* 02/01/2015   MCV 90.3 02/01/2015   PLT  02/01/2015    PLATELET CLUMPS NOTED ON SMEAR, COUNT APPEARS DECREASED     Chemistry      Component Value Date/Time   NA 138 01/31/2015 0407   NA 142 08/01/2014 1509   K 3.9 01/31/2015 0407   K 4.1 08/01/2014 1509   CL 107 01/31/2015 0407   CL 107 08/03/2012 1449   CO2 24 01/31/2015 0407   CO2 23 08/01/2014 1509   BUN 16 01/31/2015 0407   BUN 16.9 08/01/2014 1509   CREATININE 0.71 01/31/2015 0407   CREATININE 0.7 08/01/2014 1509   CREATININE 0.62 07/02/2014 1118      Component Value Date/Time   CALCIUM 8.5* 01/31/2015 0407   CALCIUM 9.3 08/01/2014 1509   ALKPHOS 64 01/31/2015 0407   ALKPHOS 82 08/01/2014 1509   AST 36 01/31/2015 0407   AST 54* 08/01/2014 1509   ALT 22 01/31/2015 0407   ALT 30 08/01/2014 1509   BILITOT 2.3* 01/31/2015 0407   BILITOT 1.71* 08/01/2014 1509      IMPRESSION: 1. Image quality is degraded by body habitus and bowel gas. 2. Cirrhosis and splenomegaly. Impression and Plan:   EKG showed atrial flutter with rapid ventricular response.  This is a pleasant 60 year old female with the following issues:     1. Thrombocytopenia  due to cirrhosis of the liver. Her platelet count is also underestimated by frequent clumping. Her cirrhosis of the liver have cause splenomegaly from portal hypertension and splenic sequestration. All these findings lead to thrombocytopenia that probably underestimates her actual platelet count. She does not have any signs of symptoms of bleeding and does not require any intervention. 2. Tachycardia: It appears that she has a new onset atrial flutter that needs urgent evaluation. Although she is not hypotensive she is becoming symptomatic. We will refer her to emergency department. 3. Cirrhosis of the liver: Follow up with gastroenterology regarding this issue. 4. Follow up: in 4 months scan sooner  if needed to.    [facility] 3/21/20173:05 PM

## 2015-12-08 NOTE — Telephone Encounter (Signed)
per pof to sch pt appt-gave pt copy of avs °

## 2015-12-08 NOTE — ED Notes (Signed)
Attempted to call report , secretary will give message to receiving RN to call back.

## 2015-12-08 NOTE — ED Notes (Signed)
Pt presents with c/o tachycardia and EKG changes. Pt went to the cancer center to have her platelets checked and was noted to have EKG changes and an elevated HR of 136. Pt denies any chest pain or shortness of breath at this time. Pt reports that she woke up agitated around 3 this morning and ever since then her HR has been elevated throughout the day.

## 2015-12-08 NOTE — ED Provider Notes (Signed)
CSN: [id number]     Arrival date & time 12/08/15  1524 History   First MD Initiated Contact with Patient 12/08/15 1536     Chief Complaint  Patient presents with  . Tachycardia     (Consider location/radiation/quality/duration/timing/severity/associated sxs/prior Treatment) HPI 60 year old female who presents with tachycardia. History of DM, NASH cirrhosis c/b portal hypertension, ITP, HTN. States that she woke up at 3 AM in the morning "feeling funny." Measured her vital signs at home and heart rate noted between 135-138. [facility] for [facility] this [facility] for [facility] check, and sent to ED for further evaluation. No chest pain, sob, doe, worsening edema or abd distension, fevers, N/V/D, melena, hematochezia, syncope or near syncope. Had mild case of bronchitis recently, but otherwise in usual state of health.  Past Medical History  Diagnosis Date  . Fibromyalgia   . Hx of colonic polyps   . HTN (hypertension)   . ITP (idiopathic thrombocytopenic purpura)   . Sleep apnea     uses cpap setting of 14  . Liver cirrhosis secondary to NASH (nonalcoholic steatohepatitis) dx nov 2014    had enlarged spleen also   . PONV (postoperative nausea and vomiting)     in past none recent  . Diabetes mellitus without complication ([place])   . Hyperlipidemia   . Portal hypertension (HCC)    Past Surgical History  Procedure Laterality Date  . Colectomy  2011  . Knee arthroscopy  yrs ago    bilateral, one done x 1, one done twice  . Cholecystectomy  20 yrs ago  . Cesarean section  1989  . Esophagogastroduodenoscopy (egd) with propofol N/A 09/04/2013    Procedure: ESOPHAGOGASTRODUODENOSCOPY (EGD) WITH PROPOFOL;  Surgeon: [name], MD;  Location: [facility];  Service: Endoscopy;  Laterality: N/A;   Family History  Problem Relation Age of Onset  . Heart failure Mother   . Cancer Mother   . Hyperlipidemia Mother   . Lung cancer Father   . Cancer Father   . Cancer  Brother   . Hyperlipidemia Brother   . Stroke Other    Social History  Substance Use Topics  . Smoking status: Former Smoker -- 1.00 packs/day for 3 years    Types: Cigarettes    Quit date: 09/19/1974  . Smokeless tobacco: Never Used  . Alcohol Use: No   OB History    No data available     Review of Systems 10/14 systems reviewed and are negative other than those stated in the HPI    Allergies  Celecoxib; Prednisone; Shellfish allergy; and Statins  Home Medications   Prior to Admission medications   Medication Sig Start Date End Date Taking? Authorizing Provider  acetaminophen (TYLENOL) 500 MG tablet Take 1,000 mg by mouth every 6 (six) hours as needed for moderate pain or headache.   Yes Historical Provider, MD  Cholecalciferol (VITAMIN D) 2000 UNITS tablet Take 2,000 Units by mouth daily.   Yes Historical Provider, MD  Coenzyme Q10 (CO Q 10) 100 MG CAPS Take 100 mg by mouth daily.   Yes Historical Provider, MD  cyanocobalamin 1000 MCG tablet Take 1,000 mcg by mouth daily.   Yes Historical Provider, MD  Ferrous Sulfate (IRON) 325 (65 FE) MG TABS Take 1 tablet by mouth daily.   Yes Historical Provider, MD  furosemide (LASIX) 40 MG tablet Take 40 mg by mouth daily with breakfast. 07/24/13  Yes Thurnell Lose, MD  methocarbamol (ROBAXIN) 500 MG  tablet Take 250-750 mg by mouth daily as needed for muscle spasms.    Yes Historical Provider, MD  Multiple Vitamins-Minerals (MULTIVITAMIN PO) Take 1 tablet by mouth daily.   Yes Historical Provider, MD  propranolol (INDERAL) 10 MG tablet Take 10 mg by mouth 2 (two) times daily.  07/02/14  Yes Historical Provider, MD  spironolactone (ALDACTONE) 25 MG tablet Take 25 mg by mouth daily.  07/24/13  Yes Thurnell Lose, MD   BP 127/72 mmHg  Pulse 136  Temp(Src) 97.8 F (36.6 C) (Oral)  Resp 21  SpO2 97% Physical Exam Physical Exam  Nursing note and vitals reviewed. Constitutional: Well developed, well nourished, non-toxic, and in no  acute distress Head: Normocephalic and atraumatic.  Mouth/Throat: Oropharynx is clear and moist.  Neck: Normal range of motion. Neck supple.  Cardiovascular: Tachycardic rate and regular rhythm.  +1 edema bilaterally Pulmonary/Chest: Effort normal and breath sounds normal.  Abdominal: Soft. Mild distension.There is no tenderness. There is no rebound and no guarding.  Musculoskeletal: Normal range of motion.  Neurological: Alert, no facial droop, fluent speech, moves all extremities symmetrically Skin: Skin is warm and dry.  Psychiatric: Cooperative   ED Course  Procedures (including critical care time) Labs Review Labs Reviewed  CBC WITH DIFFERENTIAL/PLATELET - Abnormal; Notable for the following:    Platelets 25 (*)    All other components within normal limits  COMPREHENSIVE METABOLIC PANEL - Abnormal; Notable for the following:    Glucose, Bld 112 (*)    AST 45 (*)    Total Bilirubin 2.1 (*)    All other components within normal limits  PROTIME-INR - Abnormal; Notable for the following:    Prothrombin Time 15.9 (*)    All other components within normal limits  MAGNESIUM  PHOSPHORUS  APTT  I-STAT TROPOININ, ED    Imaging Review Dg Chest Portable 1 View  12/08/2015  CLINICAL DATA:  Tachycardia.  Hypertension. EXAM: PORTABLE CHEST 1 VIEW COMPARISON:  Jan 29, 2015 FINDINGS: There is no edema or consolidation. There is cardiomegaly. Pulmonary vascularity is normal. No adenopathy. No bone lesions. IMPRESSION: Moderate generalized cardiac enlargement. No edema or consolidation. Electronically Signed   By: Lowella Grip III M.D.   On: 12/08/2015 16:12   I have personally reviewed and evaluated these images and lab results as part of my medical decision-making.   EKG Interpretation   Date/Time:  Tuesday December 08 2015 15:33:48 EDT Ventricular Rate:  135 PR Interval:  119 QRS Duration: 147 QT Interval:  375 QTC Calculation: 562 R Axis:   22 Text Interpretation:  Sinus  tachycardia IVCD, consider atypical RBBB  Inferior infarct, acute Lateral leads are also involved Atrial flutter 2:1  conduction Confirmed by Raul Winterhalter MD, Klee Kolek [phone]) on 12/08/2015 5:43:03 PM       CRITICAL CARE Performed by: Forde Dandy   Total critical care time: 35 minutes  Critical care time was exclusive of separately billable procedures and treating other patients.  Critical care was necessary to treat or prevent imminent or life-threatening deterioration.  Critical care was time spent personally by me on the following activities: development of treatment plan with patient and/or surrogate as well as nursing, discussions with consultants, evaluation of patient's response to treatment, examination of patient, obtaining history from patient or surrogate, ordering and performing treatments and interventions, ordering and review of laboratory studies, ordering and review of radiographic studies, pulse oximetry and re-evaluation of patient's condition.   MDM   Final diagnoses:  Typical atrial  flutter ([place])    Appears to be new onset atrial flutter with 2:1 at HR 137. Normotensive and in no distress. No major ultralight or metabolic derangements on blood work. Negative troponin. Does appear mildly fluid overloaded on exam, but she states that this is her baseline and she has been compliant with Lasix. A chest x-ray with mildly enlarged heart, but no evidence of pulmonary vascular congestion or edema. She has thrombocytopenia with a platelet count of 25, which seems to be at her baseline. No evidence of active bleeding. Started on diltiazem drip. Discussed with Dr. Oval Linsey from cardiology, who recommended transfer to [facility] for potential TEE and cardioversion. Subsequently admitted to hospitalist service, SDU at [facility].    Forde Dandy, MD 12/08/15 [phone]

## 2015-12-09 ENCOUNTER — Encounter (HOSPITAL_COMMUNITY): Payer: Self-pay | Admitting: Nurse Practitioner

## 2015-12-09 DIAGNOSIS — G4733 Obstructive sleep apnea (adult) (pediatric): Secondary | ICD-10-CM

## 2015-12-09 DIAGNOSIS — I480 Paroxysmal atrial fibrillation: Secondary | ICD-10-CM

## 2015-12-09 DIAGNOSIS — D6949 Other primary thrombocytopenia: Secondary | ICD-10-CM

## 2015-12-09 DIAGNOSIS — I483 Typical atrial flutter: Secondary | ICD-10-CM

## 2015-12-09 DIAGNOSIS — K7469 Other cirrhosis of liver: Secondary | ICD-10-CM

## 2015-12-09 LAB — CBC
HCT: 36 % (ref 36.0–46.0)
HEMOGLOBIN: 12.1 g/dL (ref 12.0–15.0)
MCH: 30.6 pg (ref 26.0–34.0)
MCHC: 33.6 g/dL (ref 30.0–36.0)
MCV: 91.1 fL (ref 78.0–100.0)
Platelets: DECREASED 10*3/uL (ref 150–400)
RBC: 3.95 MIL/uL (ref 3.87–5.11)
RDW: 15.7 % — ABNORMAL HIGH (ref 11.5–15.5)
WBC: 4.6 10*3/uL (ref 4.0–10.5)

## 2015-12-09 LAB — COMPREHENSIVE METABOLIC PANEL
ALBUMIN: 3.1 g/dL — AB (ref 3.5–5.0)
ALK PHOS: 55 U/L (ref 38–126)
ALT: 23 U/L (ref 14–54)
AST: 42 U/L — AB (ref 15–41)
Anion gap: 6 (ref 5–15)
BUN: 12 mg/dL (ref 6–20)
CALCIUM: 8.8 mg/dL — AB (ref 8.9–10.3)
CHLORIDE: 107 mmol/L (ref 101–111)
CO2: 24 mmol/L (ref 22–32)
CREATININE: 0.76 mg/dL (ref 0.44–1.00)
GFR calc non Af Amer: >60 mL/min (ref >60)
GLUCOSE: 127 mg/dL — AB (ref 65–99)
Potassium: 3.8 mmol/L (ref 3.5–5.1)
SODIUM: 137 mmol/L (ref 135–145)
Total Bilirubin: 2.7 mg/dL — ABNORMAL HIGH (ref 0.3–1.2)
Total Protein: 6.4 g/dL — ABNORMAL LOW (ref 6.5–8.1)

## 2015-12-09 LAB — GLUCOSE, CAPILLARY
GLUCOSE-CAPILLARY: 95 mg/dL (ref 65–99)
Glucose-Capillary: 146 mg/dL — ABNORMAL HIGH (ref 65–99)
Glucose-Capillary: 139 mg/dL — ABNORMAL HIGH (ref 65–99)
Glucose-Capillary: 111 mg/dL — ABNORMAL HIGH (ref 65–99)

## 2015-12-09 LAB — MAGNESIUM: Magnesium: 1.7 mg/dL (ref 1.7–2.4)

## 2015-12-09 LAB — CBG MONITORING, ED: GLUCOSE-CAPILLARY: 150 mg/dL — AB (ref 65–99)

## 2015-12-09 LAB — MRSA PCR SCREENING: MRSA by PCR: NEGATIVE

## 2015-12-09 LAB — TROPONIN I
Troponin I: <0.03 ng/mL (ref <0.031)
Troponin I: <0.03 ng/mL (ref <0.031)

## 2015-12-09 LAB — TSH: TSH: 1.59 u[IU]/mL (ref 0.350–4.500)

## 2015-12-09 MED ORDER — FUROSEMIDE 40 MG PO TABS
40.0000 mg | ORAL_TABLET | Freq: Every day | ORAL | Status: DC
  Administered 2015-12-09 – 2015-12-12 (×4): 40 mg via ORAL
  Filled 2015-12-09 (×4): qty 1

## 2015-12-09 MED ORDER — DILTIAZEM HCL 60 MG PO TABS
120.0000 mg | ORAL_TABLET | Freq: Three times a day (TID) | ORAL | Status: DC
  Administered 2015-12-09 – 2015-12-12 (×10): 120 mg via ORAL
  Filled 2015-12-09 (×10): qty 2

## 2015-12-09 NOTE — Progress Notes (Signed)
PROGRESS NOTE  STPEHANIE MONTROY Khan:[id number] DOB: June 29, 1956 DOA: 12/08/2015 PCP: Lujean Amel, MD Outpatient Specialists:    LOS: 1 day   Brief Narrative: 60 yo F with liver cirrhosis, thrombocytopenia, admitted on 3/21 with new onset A fib/flutter with RVR  Assessment & Plan: Active Problems:   Primary thrombocytopenia (HCC)   Obstructive sleep apnea   DIABETES MELLITUS, BORDERLINE   Cirrhosis of liver, with portal HTN & Acites, by CT Abdomen 07/20/13   Atrial fibrillation (HCC)   Atrial flutter (HCC)   A fib with RVR - rates with some improvement with diltiazem, continue, also on propranolol - cardiology following, GI recommending against anticoagulation. Hematology to see as well - 2D echo pending  Thrombocytopenia - Appears to be chronic, at baseline, no evidence of active bleed, followed by Dr. Osker Mason from hematology  - Felt to be secondary to liver cirrhosis  Liver cirrhosis due to [facility] with portal hypertension - Does not appear to be encephalopathic, LFTs within normal limits, INR and PTT with normal limits - continue with home medication including Aldactone and propranolol - last EGD 2014 with diffuse portal gastropathy and gastric varices.   - I discussed with patient's primary gastroenterologist Dr. Paulita Fujita who would NOT recommend anticoagulation as the patient would be very high risk of a fatal bleeding event.  Diabetes mellitus, borderline - She reports is controlled with diet, most recent A1c at PCP office was 5.7 - We'll continue with CBG, if elevated will start on insulin sliding scale  Obstructive sleep apnea - Continue C Pap   DVT prophylaxis: SCD Code Status: Full Family Communication: no family bedside Disposition Plan: home when ready Barriers for discharge: a fib  Consultants:   Cardiology   [facility]  GI - Dr. Paulita Fujita (phone)  Procedures:   2D echo: pending  Antimicrobials:  None    Subjective: - no complaints, denies chest pain /  palpitations   Objective: Filed Vitals:   12/09/15 0900 12/09/15 1006 12/09/15 1219 12/09/15 1324  BP: 141/48  140/60 105/56  Pulse: 126  95   Temp:  98 F (36.7 C) 98.2 F (36.8 C)   TempSrc:  Oral Oral   Resp:  18 18   Height:      Weight:      SpO2:  98% 98%     Intake/Output Summary (Last 24 hours) at 12/09/15 1424 Last data filed at 12/09/15 1414  Gross per 24 hour  Intake 1144.09 ml  Output   1851 ml  Net -706.91 ml   Filed Weights   12/08/15 2124 12/09/15 0441  Weight: 162.977 kg (359 lb 4.8 oz) 162.569 kg (358 lb 6.4 oz)   Examination: BP 105/56 mmHg  Pulse 95  Temp(Src) 98.2 F (36.8 C) (Oral)  Resp 18  Ht 5' 7"  (1.702 m)  Wt 162.569 kg (358 lb 6.4 oz)  BMI 56.12 kg/m2  SpO2 98%  GENERAL: NAD  HEENT: head NCAT, no scleral icterus. Pupils round and reactive.  NECK: Supple. No carotid bruits. No lymphadenopathy or thyromegaly.  LUNGS: Clear to auscultation. No wheezing or crackles  HEART: irregular, tachycardic. 2+ pulses, no JVD, trace peripheral edema  ABDOMEN: Soft, nontender, and nondistended. Positive bowel sounds.   EXTREMITIES: Without any cyanosis or clubbing  NEUROLOGIC: non focal   Data Reviewed: I have personally reviewed following labs and imaging studies  CBC:  Recent Labs Lab 12/08/15 1551 12/09/15 0331  WBC 4.4 4.6  NEUTROABS 2.8  --   HGB 12.6 12.1  HCT 37.1  36.0  MCV 89.4 91.1  PLT 25* PLATELET CLUMPS NOTED ON SMEAR, COUNT APPEARS DECREASED   Basic Metabolic Panel:  Recent Labs Lab 12/08/15 1551 12/09/15 0331 12/09/15 1130  NA 141 137  --   K 4.2 3.8  --   CL 107 107  --   CO2 26 24  --   GLUCOSE 112* 127*  --   BUN 18 12  --   CREATININE 0.73 0.76  --   CALCIUM 9.6 8.8*  --   MG 2.0  --  1.7  PHOS 3.7  --   --    GFR: Estimated Creatinine Clearance: 121.9 mL/min (by C-G formula based on Cr of 0.76). Liver Function Tests:  Recent Labs Lab 12/08/15 1551 12/09/15 0331  AST 45* 42*  ALT 25 23    ALKPHOS 62 55  BILITOT 2.1* 2.7*  PROT 7.5 6.4*  ALBUMIN 3.6 3.1*   Coagulation Profile:  Recent Labs Lab 12/08/15 1551  INR 1.25   Cardiac Enzymes:  Recent Labs Lab 12/08/15 2128 12/09/15 0331 12/09/15 1130  TROPONINI <0.03 <0.03 <0.03   CBG:  Recent Labs Lab 12/08/15 2001 12/09/15 0735 12/09/15 1124  GLUCAP 150* 146* 139*   Lipid Profile: No results for input(s): CHOL, HDL, LDLCALC, TRIG, CHOLHDL, LDLDIRECT in the last 72 hours. Thyroid Function Tests:  Recent Labs  12/09/15 1130  TSH 1.590   Anemia Panel: No results for input(s): VITAMINB12, FOLATE, FERRITIN, TIBC, IRON, RETICCTPCT in the last 72 hours. Urine analysis:    Component Value Date/Time   COLORURINE AMBER* 01/31/2015 0055   APPEARANCEUR CLEAR 01/31/2015 0055   LABSPEC 1.025 01/31/2015 0055   PHURINE 6.0 01/31/2015 0055   GLUCOSEU NEGATIVE 01/31/2015 0055   HGBUR TRACE* 01/31/2015 0055   BILIRUBINUR SMALL* 01/31/2015 0055   KETONESUR 15* 01/31/2015 0055   PROTEINUR NEGATIVE 01/31/2015 0055   UROBILINOGEN 2.0* 01/31/2015 0055   NITRITE NEGATIVE 01/31/2015 0055   LEUKOCYTESUR NEGATIVE 01/31/2015 0055   Sepsis Labs: Invalid input(s): PROCALCITONIN, LACTICIDVEN  Recent Results (from the past 240 hour(s))  MRSA PCR Screening     Status: None   Collection Time: 12/08/15 10:09 PM  Result Value Ref Range Status   MRSA by PCR NEGATIVE NEGATIVE Final    Comment:        The GeneXpert MRSA Assay (FDA approved for NASAL specimens only), is one component of a comprehensive MRSA colonization surveillance program. It is not intended to diagnose MRSA infection nor to guide or monitor treatment for MRSA infections.     Radiology Studies: Dg Chest Portable 1 View  12/08/2015  CLINICAL DATA:  Tachycardia.  Hypertension. EXAM: PORTABLE CHEST 1 VIEW COMPARISON:  Jan 29, 2015 FINDINGS: There is no edema or consolidation. There is cardiomegaly. Pulmonary vascularity is normal. No adenopathy. No  bone lesions. IMPRESSION: Moderate generalized cardiac enlargement. No edema or consolidation. Electronically Signed   By: Lowella Grip III M.D.   On: 12/08/2015 16:12   Scheduled Meds: . cholecalciferol  2,000 Units Oral Daily  . diltiazem  120 mg Oral 3 times per day  . ferrous sulfate   Oral Daily  . furosemide  40 mg Oral Daily  . propranolol  10 mg Oral BID  . sodium chloride flush  3 mL Intravenous Q12H  . spironolactone  25 mg Oral Daily  . cyanocobalamin  1,000 mcg Oral Daily   Continuous Infusions: . diltiazem (CARDIZEM) infusion Stopped (12/09/15 1431)    Marzetta Board, MD, PhD [facility] Pager [phone]  If 7PM-7AM, please contact night-coverage [email or web address] Password [facility] 12/09/2015, 2:24 PM

## 2015-12-09 NOTE — Consult Note (Deleted)
Cardiology Consult    Patient ID: Hannah Khan MRN: [id number], DOB/AGE: 60-Mar-1957   Admit date: 12/08/2015 Date of Consult: 12/09/2015  Primary Physician: Lujean Amel, MD Primary Cardiologist: n/a Requesting Provider: Dr. Waldron Labs  Patient Profile    60 y/o female with hx of chronic cirrhosis 2/2 NASH with related ITP (stable), portal hypertension, DM, obesity, OSA on cpap, HTN, HLD, fibromyalgia admitted 12/08/15 with 2:1 Aflutter.  Past Medical History   Past Medical History  Diagnosis Date  . Fibromyalgia   . Hx of colonic polyps   . HTN (hypertension)   . ITP (idiopathic thrombocytopenic purpura)   . Sleep apnea     uses cpap setting of 14  . Liver cirrhosis secondary to NASH (nonalcoholic steatohepatitis) dx nov 2014    had enlarged spleen also   . PONV (postoperative nausea and vomiting)     in past none recent  . Diabetes mellitus without complication ([place])   . Hyperlipidemia   . Portal hypertension (HCC)     Past Surgical History  Procedure Laterality Date  . Colectomy  2011  . Knee arthroscopy  yrs ago    bilateral, one done x 1, one done twice  . Cholecystectomy  20 yrs ago  . Cesarean section  1989  . Esophagogastroduodenoscopy (egd) with propofol N/A 09/04/2013    Procedure: ESOPHAGOGASTRODUODENOSCOPY (EGD) WITH PROPOFOL;  Surgeon: [name], MD;  Location: [facility];  Service: Endoscopy;  Laterality: N/A;     Allergies  Allergies  Allergen Reactions  . Celecoxib Other (See Comments)    "speech slurred" with celebrex  . Prednisone Other (See Comments)    Rapid heart rate  . Shellfish Allergy Swelling    Lips tingle and swell. Whelps the size of quarters.   . Statins Other (See Comments)    aggravated fibromyalgia    History of Present Illness    60 y/o female with prior history of ITP (felt to be due to cirrhosis w/baseline plts 25-30, often frequently underestimated 2/2 clumping per heme-onc), liver cirrhosis secondary to NASH,  colon p0lyps, portal hypertension, obesity, OSA on cpap, fibromyalgia, DM, HTN, and HLD.  She has no prior cardiac history and has not had any recent changes in her exercise tolerance, though she is fairly sedentary.  She recently went on a cruise to [place] (drove to [place], [place] - ~ 10 hrs there and then back - did not stop often to walk during drive).  Following return from her trip, she continued to do well.  She says that she checks her blood pressure a few times per week and notes that it was ok over the weekend (not sure if it was Sat or Sun), as was her HR.  She awoke on the morning of 3/21 around 0300 after having a nightmare.  She reports having the same nightmare for the three nights in a row but that morning was different in that she woke up and felt anxious and could not go back to sleep.  Because she "felt funny" and somewhat jittery, she took her heart rate which was around 135 and bp was around 076 systolic.  She did not have associated Ss of cp, sob, DOE, fevers, nearcope/syncope, bleeding, or worsening edema. She stayed up and watched some TV and then went to work that morning.  She remained asymptomatic.  On the afternoon of 3/21, she went to her hematology office visit at the cancer center at [facility] for routine blood work/plt check, found to be tachycardic and  was taken to [facility] ED for further eval.     In the ER she was found to have new onset Atrial flutter with 2:1 av conduction with rates in the 130s.  She was treated with diltiazem 55m bolus then started on drip with rate control on 129mhr.  Troponin x 3 have been negative.  CXR noted chronic cardiomegaly without edema or congestion.  H/H stable 12.6./37.1, plt 25, wbc 4.4, normal bmet other than glucose 112, and hepatic function appears wnl for her baseline (AST 45, bili 2.1).   She did miss her normal dose of lasix 3/21 and due to this reports slight increase in swelling in lower extremities but no SOB.  No anticoagulation started given her  history of thrombocytopenia.  [facility] and consult by Cardiology.  Currently no complaints.  Inpatient Medications    . cholecalciferol  2,000 Units Oral Daily  . ferrous sulfate   Oral Daily  . furosemide  40 mg Oral Daily  . propranolol  10 mg Oral BID  . sodium chloride flush  3 mL Intravenous Q12H  . spironolactone  25 mg Oral Daily  . cyanocobalamin  1,000 mcg Oral Daily    Family History    Family History  Problem Relation Age of Onset  . Heart failure Mother   . Cancer Mother   . Hyperlipidemia Mother   . Lung cancer Father   . Cancer Father   . Cancer Brother   . Hyperlipidemia Brother   . Stroke Other     Social History    Social History   Social History  . Marital Status: Married    Spouse Name: N/A  . Number of Children: N/A  . Years of Education: N/A   Occupational History  . [employer]    Social History Main Topics  . Smoking status: Former Smoker -- 1.00 packs/day for 3 years    Types: Cigarettes    Quit date: 09/19/1974  . Smokeless tobacco: Never Used  . Alcohol Use: No  . Drug Use: No  . Sexual Activity: Not on file   Other Topics Concern  . Not on file   Social History Narrative   [place].  Instructor for early childhood development.       Review of Systems    General:  +++ anxiety.  No chills, fever, night sweats or weight changes.  Cardiovascular:  No chest pain, dyspnea on exertion, +++ chronic mild LE edema w/ venous stasis changes, no orthopnea, palpitations, paroxysmal nocturnal dyspnea. Dermatological: No rash, lesions/masses Respiratory: No cough, dyspnea Urologic: No hematuria, dysuria Abdominal:   No nausea, vomiting, diarrhea, bright red blood per rectum, melena, or hematemesis Neurologic:  No visual changes, wkns, changes in mental status. All other systems reviewed and are otherwise negative except as noted above.  Physical Exam      Blood pressure 141/48, pulse 126, temperature 98 F (36.7 C), temperature source Oral, resp. rate 18, height 5' 7"  (1.702 m), weight 358 lb 6.4 oz (162.569 kg), SpO2 98 %.  General: Pleasant, obese female sitting on the side of the bed in NAD Psych: Normal affect. Neuro: Alert and oriented X 3. Moves all extremities spontaneously. HEENT: Normal  Neck: Supple without bruits.  JVD assessment limited 2/2 neck girth. Lungs:  Resp regular and unlabored, CTA. Heart: IR, IR, 2/6 SEM bilat USB, no s3, s4. Abdomen: Soft, obese, non-tender, non-distended, BS + x 4.  Extremities: No clubbing or cyanosis, chronic bilateral lower extremity discoloration, +1 pitting ankle edema, DP/PT/Radials 2+ and equal bilaterally.  Labs    Troponin [facility] of Care Test)  Recent Labs  12/08/15 1556  TROPIPOC 0.01    Recent Labs  12/08/15 2128 12/09/15 0331  TROPONINI <0.03 <0.03   Lab Results  Component Value Date   WBC 4.6 12/09/2015   HGB 12.1 12/09/2015   HCT 36.0 12/09/2015   MCV 91.1 12/09/2015   PLT  12/09/2015    PLATELET CLUMPS NOTED ON SMEAR, COUNT APPEARS DECREASED     Recent Labs Lab 12/09/15 0331  NA 137  K 3.8  CL 107  CO2 24  BUN 12  CREATININE 0.76  CALCIUM 8.8*  PROT 6.4*  BILITOT 2.7*  ALKPHOS 55  ALT 23  AST 42*  GLUCOSE 127*   Lab Results  Component Value Date   CHOL 175 07/02/2014   HDL 59 07/02/2014   LDLCALC 97 07/02/2014   TRIG 93 07/02/2014    Radiology Studies    Dg Chest Portable 1 View  12/08/2015  CLINICAL DATA:  Tachycardia.  Hypertension. EXAM: PORTABLE CHEST 1 VIEW COMPARISON:  Jan 29, 2015 FINDINGS: There is no edema or consolidation. There is cardiomegaly. Pulmonary vascularity is normal. No adenopathy. No bone lesions. IMPRESSION: Moderate generalized cardiac enlargement. No edema or consolidation. Electronically Signed   By: Lowella Grip III M.D.   On: 12/08/2015 16:12    ECG & Cardiac Imaging    Tele-  Aflutter rate 130's overnight  with slowing this AM - now variable conduction.  ECG 12/08/15- Aflutter with 2:1 av conduction, nonspecific ST/T changes.  (prior ecg on 08/07/14 nsr, 74, norm axix, low voltage)  Assessment & Plan    1.  Atrial flutter w/ 2:1 AV conduction.  60 y/o female who awoke agitated and "feeling funny" on 3/21 around 0300 and checked her heart rate at home at 135.  Later on in the day, she went to the cancer center for routine blood work and plt check.  They found her to be tachycardic and was sent to ER where ecg showed aflutter with 2:1 av conduction.  Placed on IV dilt with some improvement in rate control this AM. BP stable, and asymptomatic with no chest pain, sob, palpitations or near syncope/near-syncope.  Her troponins have been negative and ECG shows aflutter with non-specific changes.  Of note, she did recently return from a cruise.  Prior to and after cruise, she drove 10 hrs to/from [place], [place].  She has not had any DOE, c/p, or hypoxia  doubt PE.  Also, she checked her BP/HR over the weekend (not sure if it was Sat or Sun) and they were both normal. - Check TSH, Mg.  K wnl. - Agree w/ echo. - CHAD2DS2-VASc score 3 - will require anticoagulation, at least in the short-term.  Given h/o ITP, will ask heme-onc for preference. - Provided that we are able to anticoagulate, can plan to perform TEE/DCCV once on adequate anticoagulation (TBD by heme-onc - ? Heparin pre-dccv vs NOAC).   2.  ITP/chronic thrombocytopenia related to her cirrhosis/NASH with her baseline platelet count around 25-30, often thought to be frequently underestimated 2/2 clumping per hematology notes.  This has been stable with frequent observation in her blood work.  No recent reports or issues of bleeding or bruising.  As above, will ask Heme-onc for input.  3.  Obstructive sleep apnea.  Wears cpap at home and is compliant with  this.    4.  Hypertensive Heart Dzs:  BP stable on  blocker and diuretic.  5.  NASH/Portal HTN:  This  has been managed by her GI doctor.  Other than her increase in BLE edema 2/2 to missing her lasix yesterday, her volume status appears stable given normal CXR, clear breath sounds, and absence of sob.    6.  Type II Diabetes- controlled.  IM following.  7.  HL:  Ideally would be on statin in setting of DMII however she is intolerant.    Signed, Murray Hodgkins, NP 12/09/2015, 10:21 AM

## 2015-12-09 NOTE — Consult Note (Signed)
Cardiology Consult    Patient ID: Hannah Khan MRN: [id number], DOB/AGE: April 25, 1956   Admit date: 12/08/2015 Date of Consult: 12/09/2015  Primary Physician: Lujean Amel, MD Primary Cardiologist: n/a Requesting Provider: Dr. Waldron Labs  Patient Profile    60 y/o female with hx of chronic cirrhosis 2/2 NASH with related ITP (stable), portal hypertension, DM, obesity, OSA on cpap, HTN, HLD, fibromyalgia admitted 12/08/15 with 2:1 Aflutter.  Past Medical History   Past Medical History  Diagnosis Date  . Fibromyalgia   . Hx of colonic polyps   . HTN (hypertension)   . ITP (idiopathic thrombocytopenic purpura)   . Sleep apnea     uses cpap setting of 14  . Liver cirrhosis secondary to NASH (nonalcoholic steatohepatitis) dx nov 2014    had enlarged spleen also   . PONV (postoperative nausea and vomiting)     in past none recent  . Diabetes mellitus without complication ([place])   . Hyperlipidemia   . Portal hypertension (HCC)     Past Surgical History  Procedure Laterality Date  . Colectomy  2011  . Knee arthroscopy  yrs ago    bilateral, one done x 1, one done twice  . Cholecystectomy  20 yrs ago  . Cesarean section  1989  . Esophagogastroduodenoscopy (egd) with propofol N/A 09/04/2013    Procedure: ESOPHAGOGASTRODUODENOSCOPY (EGD) WITH PROPOFOL;  Surgeon: [name], MD;  Location: [facility];  Service: Endoscopy;  Laterality: N/A;     Allergies  Allergies  Allergen Reactions  . Celecoxib Other (See Comments)    "speech slurred" with celebrex  . Prednisone Other (See Comments)    Rapid heart rate  . Shellfish Allergy Swelling    Lips tingle and swell. Whelps the size of quarters.   . Statins Other (See Comments)    aggravated fibromyalgia    History of Present Illness    60 y/o female with prior history of ITP (felt to be due to cirrhosis w/baseline plts 25-30, often frequently underestimated 2/2 clumping per heme-onc), liver cirrhosis secondary to NASH,  colon p0lyps, portal hypertension, obesity, OSA on cpap, fibromyalgia, DM, HTN, and HLD.  She has no prior cardiac history and has not had any recent changes in her exercise tolerance, though she is fairly sedentary.  She recently went on a cruise to [place] (drove to [place], [place] - ~ 10 hrs there and then back - did not stop often to walk during drive).  Following return from her trip, she continued to do well.  She says that she checks her blood pressure a few times per week and notes that it was ok over the weekend (not sure if it was Sat or Sun), as was her HR.  She awoke on the morning of 3/21 around 0300 after having a nightmare.  She reports having the same nightmare for the three nights in a row but that morning was different in that she woke up and felt anxious and could not go back to sleep.  Because she "felt funny" and somewhat jittery, she took her heart rate which was around 135 and bp was around 250 systolic.  She did not have associated Ss of cp, sob, DOE, fevers, nearcope/syncope, bleeding, or worsening edema. She stayed up and watched some TV and then went to work that morning.  She remained asymptomatic.  On the afternoon of 3/21, she went to her hematology office visit at the cancer center at [facility] for routine blood work/plt check, found to be tachycardic and  was taken to [facility] ED for further eval.     In the ER she was found to have new onset Atrial flutter with 2:1 av conduction with rates in the 130s.  She was treated with diltiazem 81m bolus then started on drip with rate control on 166mhr.  Troponin x 3 have been negative.  CXR noted chronic cardiomegaly without edema or congestion.  H/H stable 12.6./37.1, plt 25, wbc 4.4, normal bmet other than glucose 112, and hepatic function appears wnl for her baseline (AST 45, bili 2.1).   She did miss her normal dose of lasix 3/21 and due to this reports slight increase in swelling in lower extremities but no SOB.  No anticoagulation started given her  history of thrombocytopenia.  [facility] and consult by Cardiology.  Currently no complaints.  Inpatient Medications    . cholecalciferol  2,000 Units Oral Daily  . ferrous sulfate   Oral Daily  . furosemide  40 mg Oral Daily  . propranolol  10 mg Oral BID  . sodium chloride flush  3 mL Intravenous Q12H  . spironolactone  25 mg Oral Daily  . cyanocobalamin  1,000 mcg Oral Daily    Family History    Family History  Problem Relation Age of Onset  . Heart failure Mother   . Cancer Mother   . Hyperlipidemia Mother   . Lung cancer Father   . Cancer Father   . Cancer Brother   . Hyperlipidemia Brother   . Stroke Other     Social History    Social History   Social History  . Marital Status: Married    Spouse Name: N/A  . Number of Children: N/A  . Years of Education: N/A   Occupational History  . [employer]    Social History Main Topics  . Smoking status: Former Smoker -- 1.00 packs/day for 3 years    Types: Cigarettes    Quit date: 09/19/1974  . Smokeless tobacco: Never Used  . Alcohol Use: No  . Drug Use: No  . Sexual Activity: Not on file   Other Topics Concern  . Not on file   Social History Narrative   [place].  Instructor for early childhood development.       Review of Systems    General:  +++ anxiety.  No chills, fever, night sweats or weight changes.  Cardiovascular:  No chest pain, dyspnea on exertion, +++ chronic mild LE edema w/ venous stasis changes, no orthopnea, palpitations, paroxysmal nocturnal dyspnea. Dermatological: No rash, lesions/masses Respiratory: No cough, dyspnea Urologic: No hematuria, dysuria Abdominal:   No nausea, vomiting, diarrhea, bright red blood per rectum, melena, or hematemesis Neurologic:  No visual changes, wkns, changes in mental status. All other systems reviewed and are otherwise negative except as noted above.  Physical Exam      Blood pressure 141/48, pulse 126, temperature 98 F (36.7 C), temperature source Oral, resp. rate 18, height 5' 7"  (1.702 m), weight 358 lb 6.4 oz (162.569 kg), SpO2 98 %.  General: Pleasant, obese female sitting on the side of the bed in NAD Psych: Normal affect. Neuro: Alert and oriented X 3. Moves all extremities spontaneously. HEENT: Normal  Neck: Supple without bruits.  JVD assessment limited 2/2 neck girth. Lungs:  Resp regular and unlabored, CTA. Heart: IR, IR, 2/6 SEM bilat USB, no s3, s4. Abdomen: Soft, obese, non-tender, non-distended, BS + x 4.  Extremities: No clubbing or cyanosis, chronic bilateral lower extremity discoloration, +1 pitting ankle edema, DP/PT/Radials 2+ and equal bilaterally.  Labs    Troponin [facility] of Care Test)  Recent Labs  12/08/15 1556  TROPIPOC 0.01    Recent Labs  12/08/15 2128 12/09/15 0331  TROPONINI <0.03 <0.03   Lab Results  Component Value Date   WBC 4.6 12/09/2015   HGB 12.1 12/09/2015   HCT 36.0 12/09/2015   MCV 91.1 12/09/2015   PLT  12/09/2015    PLATELET CLUMPS NOTED ON SMEAR, COUNT APPEARS DECREASED     Recent Labs Lab 12/09/15 0331  NA 137  K 3.8  CL 107  CO2 24  BUN 12  CREATININE 0.76  CALCIUM 8.8*  PROT 6.4*  BILITOT 2.7*  ALKPHOS 55  ALT 23  AST 42*  GLUCOSE 127*   Lab Results  Component Value Date   CHOL 175 07/02/2014   HDL 59 07/02/2014   LDLCALC 97 07/02/2014   TRIG 93 07/02/2014    Radiology Studies    Dg Chest Portable 1 View  12/08/2015  CLINICAL DATA:  Tachycardia.  Hypertension. EXAM: PORTABLE CHEST 1 VIEW COMPARISON:  Jan 29, 2015 FINDINGS: There is no edema or consolidation. There is cardiomegaly. Pulmonary vascularity is normal. No adenopathy. No bone lesions. IMPRESSION: Moderate generalized cardiac enlargement. No edema or consolidation. Electronically Signed   By: Lowella Grip III M.D.   On: 12/08/2015 16:12    ECG & Cardiac Imaging    Tele-  Aflutter rate 130's overnight  with slowing this AM - now variable conduction.  ECG 12/08/15- Aflutter with 2:1 av conduction, nonspecific ST/T changes.  (prior ecg on 08/07/14 nsr, 74, norm axix, low voltage)  Assessment & Plan    1.  Atrial flutter w/ 2:1 AV conduction.  60 y/o female who awoke agitated and "feeling funny" on 3/21 around 0300 and checked her heart rate at home at 135.  Later on in the day, she went to the cancer center for routine blood work and plt check.  They found her to be tachycardic and was sent to ER where ecg showed aflutter with 2:1 av conduction.  Placed on IV dilt with some improvement in rate control this AM. BP stable, and asymptomatic with no chest pain, sob, palpitations or near syncope/near-syncope.  Her troponins have been negative and ECG shows aflutter with non-specific changes.  Of note, she did recently return from a cruise.  Prior to and after cruise, she drove 10 hrs to/from [place], [place].  She has not had any DOE, c/p, or hypoxia  doubt PE.  Also, she checked her BP/HR over the weekend (not sure if it was Sat or Sun) and they were both normal. - Cont IV dilt and oral  blocker. - Check TSH, Mg.  K wnl. - Agree w/ echo. - CHAD2DS2-VASc score 3 - will require anticoagulation, at least in the short-term.  Given h/o ITP, will ask heme-onc for preference. - Provided that we are able to anticoagulate, can plan to perform TEE/DCCV once on adequate anticoagulation (TBD by heme-onc - ? Heparin pre-dccv vs NOAC).   2.  ITP/chronic thrombocytopenia related to her cirrhosis/NASH with her baseline platelet count around 25-30, often thought to be frequently underestimated 2/2 clumping per hematology notes.  This has been stable with frequent observation in her blood work.  No recent reports or issues of bleeding or bruising.  As above, will ask Heme-onc for input.  3.  Obstructive sleep apnea.  Wears cpap at home and is compliant with this.    4.  Hypertensive Heart Dzs:  BP stable on  blocker and  diuretic.  5.  NASH/Portal HTN:  This has been managed by her GI doctor.  Other than her increase in BLE edema 2/2 to missing her lasix yesterday, her volume status appears stable given normal CXR, clear breath sounds, and absence of sob.    6.  Type II Diabetes- controlled.  IM following.  7.  HL:  Ideally would be on statin in setting of DMII however she is intolerant.    Signed, Murray Hodgkins, NP 12/09/2015, 10:21 AM

## 2015-12-10 ENCOUNTER — Other Ambulatory Visit (HOSPITAL_COMMUNITY): Payer: BC Managed Care – PPO

## 2015-12-10 ENCOUNTER — Inpatient Hospital Stay (HOSPITAL_COMMUNITY): Payer: BC Managed Care – PPO

## 2015-12-10 DIAGNOSIS — I10 Essential (primary) hypertension: Secondary | ICD-10-CM

## 2015-12-10 DIAGNOSIS — K7031 Alcoholic cirrhosis of liver with ascites: Secondary | ICD-10-CM

## 2015-12-10 DIAGNOSIS — I4891 Unspecified atrial fibrillation: Secondary | ICD-10-CM

## 2015-12-10 LAB — URINALYSIS, ROUTINE W REFLEX MICROSCOPIC
BILIRUBIN URINE: NEGATIVE
Glucose, UA: NEGATIVE mg/dL
KETONES UR: NEGATIVE mg/dL
LEUKOCYTES UA: NEGATIVE
NITRITE: NEGATIVE
PROTEIN: 30 mg/dL — AB
Specific Gravity, Urine: 1.027 (ref 1.005–1.030)
pH: 5 (ref 5.0–8.0)

## 2015-12-10 LAB — CBC
HEMATOCRIT: 35.2 % — AB (ref 36.0–46.0)
Hemoglobin: 11.4 g/dL — ABNORMAL LOW (ref 12.0–15.0)
MCH: 29.7 pg (ref 26.0–34.0)
MCHC: 32.4 g/dL (ref 30.0–36.0)
MCV: 91.7 fL (ref 78.0–100.0)
RBC: 3.84 MIL/uL — AB (ref 3.87–5.11)
RDW: 16 % — ABNORMAL HIGH (ref 11.5–15.5)
WBC: 4.4 10*3/uL (ref 4.0–10.5)

## 2015-12-10 LAB — URINE MICROSCOPIC-ADD ON

## 2015-12-10 LAB — BASIC METABOLIC PANEL
ANION GAP: 8 (ref 5–15)
BUN: 16 mg/dL (ref 6–20)
CALCIUM: 8.6 mg/dL — AB (ref 8.9–10.3)
CO2: 24 mmol/L (ref 22–32)
Chloride: 106 mmol/L (ref 101–111)
Creatinine, Ser: 0.74 mg/dL (ref 0.44–1.00)
Glucose, Bld: 122 mg/dL — ABNORMAL HIGH (ref 65–99)
POTASSIUM: 4.4 mmol/L (ref 3.5–5.1)
Sodium: 138 mmol/L (ref 135–145)

## 2015-12-10 LAB — ECHOCARDIOGRAM COMPLETE
HEIGHTINCHES: 67 in
Weight: 5728 oz

## 2015-12-10 LAB — GLUCOSE, CAPILLARY
GLUCOSE-CAPILLARY: 130 mg/dL — AB (ref 65–99)
GLUCOSE-CAPILLARY: 139 mg/dL — AB (ref 65–99)
Glucose-Capillary: 110 mg/dL — ABNORMAL HIGH (ref 65–99)

## 2015-12-10 MED ORDER — PERFLUTREN LIPID MICROSPHERE
1.0000 mL | INTRAVENOUS | Status: AC | PRN
  Filled 2015-12-10: qty 10

## 2015-12-10 MED ORDER — PERFLUTREN LIPID MICROSPHERE
INTRAVENOUS | Status: AC
  Filled 2015-12-10: qty 10

## 2015-12-10 NOTE — Progress Notes (Signed)
  Echocardiogram 2D Echocardiogram with Definity has been performed.  Tresa Res 12/10/2015, 4:58 PM

## 2015-12-10 NOTE — Progress Notes (Signed)
PROGRESS NOTE  Hannah Khan QZE:[id number] DOB: 15-May-1956 DOA: 12/08/2015 PCP: Lujean Amel, MD Outpatient Specialists:    LOS: 2 days   Brief Narrative: 60 yo F with liver cirrhosis, thrombocytopenia, admitted on 3/21 with new onset A fib/flutter with RVR  Assessment & Plan: Principal Problem:   Atrial fibrillation with RVR ([place]) Active Problems:   Primary thrombocytopenia ([place])   Obstructive sleep apnea   Essential hypertension   DIABETES MELLITUS, BORDERLINE   Cirrhosis of liver, with portal HTN & Acites, by CT Abdomen 07/20/13   Atrial flutter (HCC)   Morbid obesity- BMI 56   A fib with RVR - rates with some improvement with diltiazem, continue, also on propranolol - cardiology following, GI recommending against anticoagulation. - 2D echo pending - discussed with Dr. Paulita Fujita today, he would not prefer patient to be on Atenolol due to selectivity. Discussed with cardiology  Thrombocytopenia - Appears to be chronic, at baseline, no evidence of active bleed, followed by Dr. Osker Mason from hematology  - Felt to be secondary to liver cirrhosis  Liver cirrhosis due to [facility] with portal hypertension - Does not appear to be encephalopathic, LFTs within normal limits, INR and PTT with normal limits - continue with home medication including Aldactone and propranolol - last EGD 2014 with diffuse portal gastropathy and gastric varices.  - I discussed with patient's primary gastroenterologist Dr. Paulita Fujita 3/22 who would NOT recommend anticoagulation as the patient would be very high risk of a fatal bleeding event.  Diabetes mellitus, borderline - She reports is controlled with diet, most recent A1c at PCP office was 5.7 - We'll continue with CBG, if elevated will start on insulin sliding scale - CBGs controlled  Obstructive sleep apnea - Continue C Pap   DVT prophylaxis: SCD Code Status: Full Family Communication: no family bedside Disposition Plan: home when ready Barriers  for discharge: a fib  Consultants:   Cardiology   Heme  GI - Dr. Paulita Fujita (phone)  Procedures:   2D echo: pending  Antimicrobials:  None    Subjective: - no complaints, denies chest pain / palpitations, feeling at baseline  Objective: Filed Vitals:   12/09/15 1553 12/09/15 2100 12/10/15 0500 12/10/15 1056  BP: 112/62 103/55 105/66 104/52  Pulse: 95 64 132 73  Temp: 98 F (36.7 C) 98.3 F (36.8 C) 97.7 F (36.5 C)   TempSrc: Oral Oral Oral   Resp: 18     Height:      Weight:   162.388 kg (358 lb)   SpO2: 96% 100% 99% 100%    Intake/Output Summary (Last 24 hours) at 12/10/15 1144 Last data filed at 12/10/15 0950  Gross per 24 hour  Intake    530 ml  Output   1561 ml  Net  -1031 ml   Filed Weights   12/08/15 2124 12/09/15 0441 12/10/15 0500  Weight: 162.977 kg (359 lb 4.8 oz) 162.569 kg (358 lb 6.4 oz) 162.388 kg (358 lb)   Examination: BP 104/52 mmHg  Pulse 73  Temp(Src) 97.7 F (36.5 C) (Oral)  Resp 18  Ht 5' 7"  (1.702 m)  Wt 162.388 kg (358 lb)  BMI 56.06 kg/m2  SpO2 100%  GENERAL: NAD  HEENT: head NCAT, no scleral icterus. Pupils round and reactive.  NECK: Supple. No carotid bruits. No lymphadenopathy or thyromegaly.  LUNGS: Clear to auscultation. No wheezing or crackles  HEART: irregular, tachycardic. 2+ pulses, no JVD, trace peripheral edema  ABDOMEN: Soft, nontender, and nondistended. Positive bowel sounds.  Data Reviewed: I have personally reviewed following labs and imaging studies  CBC:  Recent Labs Lab 12/08/15 1551 12/09/15 0331 12/10/15 0420  WBC 4.4 4.6 4.4  NEUTROABS 2.8  --   --   HGB 12.6 12.1 11.4*  HCT 37.1 36.0 35.2*  MCV 89.4 91.1 91.7  PLT 25* PLATELET CLUMPS NOTED ON SMEAR, COUNT APPEARS DECREASED PLATELET CLUMPING, SUGGEST RECOLLECTION OF SAMPLE IN CITRATE TUBE.   Basic Metabolic Panel:  Recent Labs Lab 12/08/15 1551 12/09/15 0331 12/09/15 1130 12/10/15 0420  NA 141 137  --  138  K 4.2 3.8  --  4.4    CL 107 107  --  106  CO2 26 24  --  24  GLUCOSE 112* 127*  --  122*  BUN 18 12  --  16  CREATININE 0.73 0.76  --  0.74  CALCIUM 9.6 8.8*  --  8.6*  MG 2.0  --  1.7  --   PHOS 3.7  --   --   --    GFR: Estimated Creatinine Clearance: 121.8 mL/min (by C-G formula based on Cr of 0.74). Liver Function Tests:  Recent Labs Lab 12/08/15 1551 12/09/15 0331  AST 45* 42*  ALT 25 23  ALKPHOS 62 55  BILITOT 2.1* 2.7*  PROT 7.5 6.4*  ALBUMIN 3.6 3.1*   Coagulation Profile:  Recent Labs Lab 12/08/15 1551  INR 1.25   Cardiac Enzymes:  Recent Labs Lab 12/08/15 2128 12/09/15 0331 12/09/15 1130  TROPONINI <0.03 <0.03 <0.03   CBG:  Recent Labs Lab 12/09/15 1124 12/09/15 1641 12/09/15 2109 12/10/15 0716 12/10/15 1120  GLUCAP 139* 111* 95 130* 139*   Lipid Profile: No results for input(s): CHOL, HDL, LDLCALC, TRIG, CHOLHDL, LDLDIRECT in the last 72 hours. Thyroid Function Tests:  Recent Labs  12/09/15 1130  TSH 1.590   Anemia Panel: No results for input(s): VITAMINB12, FOLATE, FERRITIN, TIBC, IRON, RETICCTPCT in the last 72 hours. Urine analysis:    Component Value Date/Time   COLORURINE AMBER* 12/10/2015 1003   APPEARANCEUR TURBID* 12/10/2015 1003   LABSPEC 1.027 12/10/2015 1003   PHURINE 5.0 12/10/2015 1003   GLUCOSEU NEGATIVE 12/10/2015 1003   HGBUR SMALL* 12/10/2015 1003   BILIRUBINUR NEGATIVE 12/10/2015 1003   KETONESUR NEGATIVE 12/10/2015 1003   PROTEINUR 30* 12/10/2015 1003   UROBILINOGEN 2.0* 01/31/2015 0055   NITRITE NEGATIVE 12/10/2015 1003   LEUKOCYTESUR NEGATIVE 12/10/2015 1003   Sepsis Labs: Invalid input(s): PROCALCITONIN, LACTICIDVEN  Recent Results (from the past 240 hour(s))  MRSA PCR Screening     Status: None   Collection Time: 12/08/15 10:09 PM  Result Value Ref Range Status   MRSA by PCR NEGATIVE NEGATIVE Final    Comment:        The GeneXpert MRSA Assay (FDA approved for NASAL specimens only), is one component of  a comprehensive MRSA colonization surveillance program. It is not intended to diagnose MRSA infection nor to guide or monitor treatment for MRSA infections.     Radiology Studies: Dg Chest Portable 1 View  12/08/2015  CLINICAL DATA:  Tachycardia.  Hypertension. EXAM: PORTABLE CHEST 1 VIEW COMPARISON:  Jan 29, 2015 FINDINGS: There is no edema or consolidation. There is cardiomegaly. Pulmonary vascularity is normal. No adenopathy. No bone lesions. IMPRESSION: Moderate generalized cardiac enlargement. No edema or consolidation. Electronically Signed   By: Lowella Grip III M.D.   On: 12/08/2015 16:12   Scheduled Meds: . cholecalciferol  2,000 Units Oral Daily  . diltiazem  120 mg  Oral 3 times per day  . ferrous sulfate   Oral Daily  . furosemide  40 mg Oral Daily  . propranolol  10 mg Oral BID  . sodium chloride flush  3 mL Intravenous Q12H  . spironolactone  25 mg Oral Daily  . cyanocobalamin  1,000 mcg Oral Daily   Continuous Infusions: . diltiazem (CARDIZEM) infusion Stopped (12/09/15 1431)   Time spent: 25 minutes  Marzetta Board, MD, PhD [facility] Pager [phone]  If 7PM-7AM, please contact night-coverage [email or web address] Password [facility] 12/10/2015, 11:44 AM

## 2015-12-10 NOTE — Progress Notes (Signed)
    Subjective:  She says she slept well- on C-pap from home  Objective:  Vital Signs in the last 24 hours: Temp:  [97.7 F (36.5 C)-98.3 F (36.8 C)] 97.7 F (36.5 C) (03/23 0500) Pulse Rate:  [64-132] 132 (03/23 0500) Resp:  [18] 18 (03/22 1553) BP: (103-140)/(55-66) 105/66 mmHg (03/23 0500) SpO2:  [96 %-100 %] 99 % (03/23 0500) Weight:  [358 lb (162.388 kg)] 358 lb (162.388 kg) (03/23 0500)  Intake/Output from previous day:  Intake/Output Summary (Last 24 hours) at 12/10/15 0919 Last data filed at 12/10/15 0500  Gross per 24 hour  Intake    530 ml  Output   2201 ml  Net  -1671 ml    Physical Exam: General appearance: alert, cooperative, no distress and morbidly obese Lungs: clear to auscultation bilaterally Heart: irregularly irregular rhythm Neurologic: Grossly normal   Rate: 110  Rhythm: atrial fibrillation  Lab Results:  Recent Labs  12/09/15 0331 12/10/15 0420  WBC 4.6 4.4  HGB 12.1 11.4*  PLT PLATELET CLUMPS NOTED ON SMEAR, COUNT APPEARS DECREASED PLATELET CLUMPING, SUGGEST RECOLLECTION OF SAMPLE IN CITRATE TUBE.    Recent Labs  12/09/15 0331 12/10/15 0420  NA 137 138  K 3.8 4.4  CL 107 106  CO2 24 24  GLUCOSE 127* 122*  BUN 12 16  CREATININE 0.76 0.74    Recent Labs  12/09/15 0331 12/09/15 1130  TROPONINI <0.03 <0.03    Recent Labs  12/08/15 1551  INR 1.25    Scheduled Meds: . cholecalciferol  2,000 Units Oral Daily  . diltiazem  120 mg Oral 3 times per day  . ferrous sulfate   Oral Daily  . furosemide  40 mg Oral Daily  . propranolol  10 mg Oral BID  . sodium chloride flush  3 mL Intravenous Q12H  . spironolactone  25 mg Oral Daily  . cyanocobalamin  1,000 mcg Oral Daily   Continuous Infusions: . diltiazem (CARDIZEM) infusion Stopped (12/09/15 1431)   PRN Meds:.levalbuterol   Imaging: Imaging results have been reviewed  Cardiac Studies: Echo pending  Assessment/Plan:  60 y/o female with prior history of ITP  (felt to be due to cirrhosis w/baseline plts 25-30, often frequently underestimated 2/2 clumping per heme-onc), liver cirrhosis secondary to NASH, colon p0lyps, portal hypertension, obesity, OSA on cpap, fibromyalgia, DM, HTN, and HLD. She has no prior cardiac history. She presented with AF with RVR.    Principal Problem:   Atrial fibrillation with RVR (HCC) Active Problems:   Atrial flutter (HCC)   Primary thrombocytopenia (HCC)   Essential hypertension   Cirrhosis of liver, with portal HTN & Acites, by CT Abdomen 07/20/13   Morbid obesity- BMI 56   Obstructive sleep apnea   DIABETES MELLITUS, BORDERLINE   PLAN: Unable to anticoagulate based on GI issues alone. She is currently not on any antiplatelet or anticoagulant. Plan will be for rate control. Consider changing Propanolol to Tenormin for rate control (not hepatically metabolized) and decreasing Diltiazem as her B/P is borderline low. MD to see. Echo ordered 3/21- still pending.  [employer] PA-C 12/10/2015, 9:19 AM [phone]

## 2015-12-11 LAB — URINALYSIS, ROUTINE W REFLEX MICROSCOPIC
Bilirubin Urine: NEGATIVE
GLUCOSE, UA: NEGATIVE mg/dL
KETONES UR: NEGATIVE mg/dL
Leukocytes, UA: NEGATIVE
Nitrite: NEGATIVE
PH: 5.5 (ref 5.0–8.0)
PROTEIN: NEGATIVE mg/dL
Specific Gravity, Urine: 1.01 (ref 1.005–1.030)

## 2015-12-11 LAB — CBC
HCT: 34.9 % — ABNORMAL LOW (ref 36.0–46.0)
HEMOGLOBIN: 11.8 g/dL — AB (ref 12.0–15.0)
MCH: 30.6 pg (ref 26.0–34.0)
MCHC: 33.8 g/dL (ref 30.0–36.0)
MCV: 90.4 fL (ref 78.0–100.0)
RBC: 3.86 MIL/uL — ABNORMAL LOW (ref 3.87–5.11)
RDW: 15.9 % — AB (ref 11.5–15.5)
WBC: 4.5 10*3/uL (ref 4.0–10.5)

## 2015-12-11 LAB — URINE MICROSCOPIC-ADD ON: Bacteria, UA: NONE SEEN

## 2015-12-11 LAB — GLUCOSE, CAPILLARY
GLUCOSE-CAPILLARY: 114 mg/dL — AB (ref 65–99)
GLUCOSE-CAPILLARY: 147 mg/dL — AB (ref 65–99)
GLUCOSE-CAPILLARY: 102 mg/dL — AB (ref 65–99)

## 2015-12-11 MED ORDER — DIGOXIN 250 MCG PO TABS
0.2500 mg | ORAL_TABLET | Freq: Every day | ORAL | Status: DC
  Administered 2015-12-12: 0.25 mg via ORAL
  Filled 2015-12-11: qty 1

## 2015-12-11 MED ORDER — DIGOXIN 0.25 MG/ML IJ SOLN
0.1250 mg | Freq: Once | INTRAMUSCULAR | Status: AC
  Administered 2015-12-11: 0.125 mg via INTRAVENOUS
  Filled 2015-12-11: qty 2

## 2015-12-11 MED ORDER — DIGOXIN 125 MCG PO TABS: 0.1250 mg | ORAL_TABLET | Freq: Every day | ORAL | Status: DC

## 2015-12-11 NOTE — Progress Notes (Signed)
PROGRESS NOTE  Hannah Khan DJM:[id number] DOB: 1956/05/02 DOA: 12/08/2015 PCP: Lujean Amel, MD Outpatient Specialists:    LOS: 3 days   Brief Narrative: 60 yo F with liver cirrhosis, thrombocytopenia, admitted on 3/21 with new onset A fib/flutter with RVR  Assessment & Plan: Principal Problem:   Atrial fibrillation with RVR ([place]) Active Problems:   Primary thrombocytopenia ([place])   Obstructive sleep apnea   Essential hypertension   DIABETES MELLITUS, BORDERLINE   Cirrhosis of liver, with portal HTN & Acites, by CT Abdomen 07/20/13   Atrial flutter (HCC)   Morbid obesity- BMI 56   A fib with RVR - rates with some improvement with diltiazem, continue, also on propranolol - cardiology following, GI recommending against anticoagulation. - 2D echo pending - discussed with Dr. Paulita Fujita today, he would not prefer patient to be on Atenolol due to selectivity. Discussed with cardiology - rate control better, mild hypotension yesterday limiting up titration of CCB - HR sustaining 130s this morning while ambulating - add digoxin. Discussed with Dr. Oval Linsey.  Thrombocytopenia - Appears to be chronic, at baseline, no evidence of active bleed, followed by Dr. Osker Mason from hematology  - Felt to be secondary to liver cirrhosis  Liver cirrhosis due to [facility] with portal hypertension - Does not appear to be encephalopathic, LFTs within normal limits, INR and PTT with normal limits - continue with home medication including Aldactone and propranolol - last EGD 2014 with diffuse portal gastropathy and gastric varices.  - I discussed with patient's primary gastroenterologist Dr. Paulita Fujita 3/22 who would NOT recommend anticoagulation as the patient would be very high risk of a fatal bleeding event.  Diabetes mellitus, borderline - She reports is controlled with diet, most recent A1c at PCP office was 5.7 - We'll continue with CBG, if elevated will start on insulin sliding scale - CBGs  controlled  Obstructive sleep apnea - Continue C Pap   DVT prophylaxis: SCD Code Status: Full Family Communication: significant other bedside Disposition Plan: home when ready Barriers for discharge: a fib  Consultants:   Cardiology   Heme  GI - Dr. Paulita Fujita (phone)  Procedures:   2D echo: pending  Antimicrobials:  None    Subjective: - no complaints, denies chest pain / palpitations, feeling at baseline  Objective: Filed Vitals:   12/10/15 2146 12/11/15 0533 12/11/15 1049 12/11/15 1052  BP:  117/57 108/52   Pulse: 107 128  100  Temp:  97.7 F (36.5 C)    TempSrc:      Resp:  20    Height:      Weight:  163.1 kg (359 lb 9.1 oz)    SpO2:  99% 99%     Intake/Output Summary (Last 24 hours) at 12/11/15 1244 Last data filed at 12/11/15 1000  Gross per 24 hour  Intake    603 ml  Output   2225 ml  Net  -1622 ml   Filed Weights   12/09/15 0441 12/10/15 0500 12/11/15 0533  Weight: 162.569 kg (358 lb 6.4 oz) 162.388 kg (358 lb) 163.1 kg (359 lb 9.1 oz)   Examination: BP 108/52 mmHg  Pulse 100  Temp(Src) 97.7 F (36.5 C) (Oral)  Resp 20  Ht 5' 7"  (1.702 m)  Wt 163.1 kg (359 lb 9.1 oz)  BMI 56.30 kg/m2  SpO2 99%  GENERAL: NAD  HEENT: head NCAT, no scleral icterus. Pupils round and reactive.  NECK: Supple. No carotid bruits. No lymphadenopathy or thyromegaly.  LUNGS: Clear to auscultation. No wheezing  or crackles  HEART: irregular, tachycardic. 2+ pulses, no JVD, trace peripheral edema  ABDOMEN: Soft, nontender, and nondistended. Positive bowel sounds.    Data Reviewed: I have personally reviewed following labs and imaging studies  CBC:  Recent Labs Lab 12/08/15 1551 12/09/15 0331 12/10/15 0420 12/11/15 0655  WBC 4.4 4.6 4.4 4.5  NEUTROABS 2.8  --   --   --   HGB 12.6 12.1 11.4* 11.8*  HCT 37.1 36.0 35.2* 34.9*  MCV 89.4 91.1 91.7 90.4  PLT 25* PLATELET CLUMPS NOTED ON SMEAR, COUNT APPEARS DECREASED PLATELET CLUMPING, SUGGEST  RECOLLECTION OF SAMPLE IN CITRATE TUBE. PLATELET CLUMPING, SUGGEST RECOLLECTION OF SAMPLE IN CITRATE TUBE.   Basic Metabolic Panel:  Recent Labs Lab 12/08/15 1551 12/09/15 0331 12/09/15 1130 12/10/15 0420  NA 141 137  --  138  K 4.2 3.8  --  4.4  CL 107 107  --  106  CO2 26 24  --  24  GLUCOSE 112* 127*  --  122*  BUN 18 12  --  16  CREATININE 0.73 0.76  --  0.74  CALCIUM 9.6 8.8*  --  8.6*  MG 2.0  --  1.7  --   PHOS 3.7  --   --   --    GFR: Estimated Creatinine Clearance: 122.2 mL/min (by C-G formula based on Cr of 0.74). Liver Function Tests:  Recent Labs Lab 12/08/15 1551 12/09/15 0331  AST 45* 42*  ALT 25 23  ALKPHOS 62 55  BILITOT 2.1* 2.7*  PROT 7.5 6.4*  ALBUMIN 3.6 3.1*   Coagulation Profile:  Recent Labs Lab 12/08/15 1551  INR 1.25   Cardiac Enzymes:  Recent Labs Lab 12/08/15 2128 12/09/15 0331 12/09/15 1130  TROPONINI <0.03 <0.03 <0.03   CBG:  Recent Labs Lab 12/10/15 0716 12/10/15 1120 12/10/15 1648 12/11/15 0724 12/11/15 1118  GLUCAP 130* 139* 110* 114* 147*   Lipid Profile: No results for input(s): CHOL, HDL, LDLCALC, TRIG, CHOLHDL, LDLDIRECT in the last 72 hours. Thyroid Function Tests:  Recent Labs  12/09/15 1130  TSH 1.590   Anemia Panel: No results for input(s): VITAMINB12, FOLATE, FERRITIN, TIBC, IRON, RETICCTPCT in the last 72 hours. Urine analysis:    Component Value Date/Time   COLORURINE AMBER* 12/10/2015 1003   APPEARANCEUR TURBID* 12/10/2015 1003   LABSPEC 1.027 12/10/2015 1003   PHURINE 5.0 12/10/2015 1003   GLUCOSEU NEGATIVE 12/10/2015 1003   HGBUR SMALL* 12/10/2015 1003   BILIRUBINUR NEGATIVE 12/10/2015 1003   KETONESUR NEGATIVE 12/10/2015 1003   PROTEINUR 30* 12/10/2015 1003   UROBILINOGEN 2.0* 01/31/2015 0055   NITRITE NEGATIVE 12/10/2015 1003   LEUKOCYTESUR NEGATIVE 12/10/2015 1003   Sepsis Labs: Invalid input(s): PROCALCITONIN, LACTICIDVEN  Recent Results (from the past 240 hour(s))  MRSA  PCR Screening     Status: None   Collection Time: 12/08/15 10:09 PM  Result Value Ref Range Status   MRSA by PCR NEGATIVE NEGATIVE Final    Comment:        The GeneXpert MRSA Assay (FDA approved for NASAL specimens only), is one component of a comprehensive MRSA colonization surveillance program. It is not intended to diagnose MRSA infection nor to guide or monitor treatment for MRSA infections.     Radiology Studies: No results found. Scheduled Meds: . cholecalciferol  2,000 Units Oral Daily  . digoxin  0.125 mg Intravenous Once  . [START ON 12/12/2015] digoxin  0.125 mg Oral Daily  . diltiazem  120 mg Oral 3 times per day  .  ferrous sulfate   Oral Daily  . furosemide  40 mg Oral Daily  . propranolol  10 mg Oral BID  . sodium chloride flush  3 mL Intravenous Q12H  . spironolactone  25 mg Oral Daily  . cyanocobalamin  1,000 mcg Oral Daily   Continuous Infusions: . diltiazem (CARDIZEM) infusion Stopped (12/09/15 1431)    Marzetta Board, MD, PhD [facility] Pager [phone]  If 7PM-7AM, please contact night-coverage [email or web address] Password [facility] 12/11/2015, 12:44 PM

## 2015-12-11 NOTE — Progress Notes (Signed)
Pt stated that she would self administer CPAP when ready for bed.  Current settings are 14 CMH20 per Pt home use.  RT to monitor and assess as needed.

## 2015-12-11 NOTE — Progress Notes (Signed)
PATIENT ID: [id number] with NASH, portal hypertension, cirrhosis, ITP, OSA on CPAP, hypertension, diabetes mellitus type 2, and hyperlipidemia here with new onset atrial fibrillation.  INTERVAL HISTORY:  Heart rate remains poorly-controlled.  SUBJECTIVE:  Feels well.  Denies palpitations, chest pain or shortness of breath    PHYSICAL EXAM Filed Vitals:   12/11/15 0533 12/11/15 1049 12/11/15 1052 12/11/15 1244  BP: 117/57 108/52  101/47  Pulse: 128  100 93  Temp: 97.7 F (36.5 C)     TempSrc:      Resp: 20     Height:      Weight: 163.1 kg (359 lb 9.1 oz)     SpO2: 99% 99%     General:  Well-appearing.  No acute distress.  Neck: No JVD.   Lungs:  CTAB.  No crackles, rhonchi or wheezes Heart:  Irregularly irregular.  Tachycardic.  No m/r.g  Abdomen:  Obese.  Soft, NT, ND.  +BS Extremities:  WWP.  Trace edema   LABS: Lab Results  Component Value Date   TROPONINI <0.03 12/09/2015   Results for orders placed or performed during the hospital encounter of 12/08/15 (from the past 24 hour(s))  Glucose, capillary     Status: Abnormal   Collection Time: 12/10/15  4:48 PM  Result Value Ref Range   Glucose-Capillary 110 (H) 65 - 99 mg/dL  CBC     Status: Abnormal   Collection Time: 12/11/15  6:55 AM  Result Value Ref Range   WBC 4.5 4.0 - 10.5 K/uL   RBC 3.86 (L) 3.87 - 5.11 MIL/uL   Hemoglobin 11.8 (L) 12.0 - 15.0 g/dL   HCT 34.9 (L) 36.0 - 46.0 %   MCV 90.4 78.0 - 100.0 fL   MCH 30.6 26.0 - 34.0 pg   MCHC 33.8 30.0 - 36.0 g/dL   RDW 15.9 (H) 11.5 - 15.5 %   Platelets  150 - 400 K/uL    PLATELET CLUMPING, SUGGEST RECOLLECTION OF SAMPLE IN CITRATE TUBE.  Glucose, capillary     Status: Abnormal   Collection Time: 12/11/15  7:24 AM  Result Value Ref Range   Glucose-Capillary 114 (H) 65 - 99 mg/dL  Glucose, capillary     Status: Abnormal   Collection Time: 12/11/15 11:18 AM  Result Value Ref Range   Glucose-Capillary 147 (H) 65 - 99 mg/dL    Intake/Output Summary (Last 24  hours) at 12/11/15 1338 Last data filed at 12/11/15 1257  Gross per 24 hour  Intake    363 ml  Output   1975 ml  Net  -1612 ml    Telemetry: Atrial fibrillation.  Rate 90s-120s.   Echo 12/10/15: Study Conclusions  - Procedure narrative: Transthoracic echocardiography. The study  was technically difficult, as a result of body habitus.  Intravenous contrast (Definity) was administered. - Left ventricle: The cavity size was normal. Wall thickness was  increased in a pattern of mild LVH. Systolic function was normal.  The estimated ejection fraction was in the range of 55% to 60%.  Wall motion was normal; there were no regional wall motion  abnormalities. The study is not technically sufficient to allow  evaluation of LV diastolic function. - Mitral valve: Mildly thickened leaflets . There was trivial  regurgitation. - Left atrium: The atrium was mildly dilated. - Inferior vena cava: The vessel was dilated. The respirophasic  diameter changes were blunted (< 50%), consistent with elevated  central venous pressure. - Pericardium, extracardiac: A trivial pericardial effusion was  identified posterior to the heart.  ASSESSMENT AND PLAN:  Principal Problem:   Atrial fibrillation with RVR (HCC) Active Problems:   Primary thrombocytopenia (HCC)   Obstructive sleep apnea   Essential hypertension   DIABETES MELLITUS, BORDERLINE   Cirrhosis of liver, with portal HTN & Acites, by CT Abdomen 07/20/13   Atrial flutter (HCC)   Morbid obesity- BMI 56   Rate remains poorly-controlled.  Echo shows normal systolic function.  Unfortunately her beta blocker cannot be increased any more due to low blood pressure.  Agree with trying digoxin.  She received 1 dose IV.  We will give an additional dose of  0.166m IV in 6 hours and start oral digoxin 0.25 mg tomorrow.  No anticoagulation due to extremely high bleeding risk.       Time spent: 20 minutes-Greater than 50% of this time was  spent in counseling, explanation of diagnosis, planning of further management, and coordination of care.    Aldrick Derrig C. ROval Linsey MD, FM[facility] - [place]3/24/2017 1:38 PM

## 2015-12-12 LAB — CBC
HCT: 34 % — ABNORMAL LOW (ref 36.0–46.0)
HEMOGLOBIN: 11.5 g/dL — AB (ref 12.0–15.0)
MCH: 30.7 pg (ref 26.0–34.0)
MCHC: 33.8 g/dL (ref 30.0–36.0)
MCV: 90.7 fL (ref 78.0–100.0)
PLATELETS: UNDETERMINED 10*3/uL (ref 150–400)
RBC: 3.75 MIL/uL — AB (ref 3.87–5.11)
RDW: 15.8 % — ABNORMAL HIGH (ref 11.5–15.5)
WBC: 4.5 10*3/uL (ref 4.0–10.5)

## 2015-12-12 LAB — GLUCOSE, CAPILLARY
Glucose-Capillary: 115 mg/dL — ABNORMAL HIGH (ref 65–99)
Glucose-Capillary: 134 mg/dL — ABNORMAL HIGH (ref 65–99)

## 2015-12-12 MED ORDER — DILTIAZEM HCL ER COATED BEADS 360 MG PO CP24: 360.0000 mg | ORAL_CAPSULE | Freq: Every day | ORAL | Status: DC

## 2015-12-12 MED ORDER — DIGOXIN 250 MCG PO TABS: 0.2500 mg | ORAL_TABLET | Freq: Every day | ORAL | Status: DC

## 2015-12-12 NOTE — Discharge Instructions (Signed)
Follow with Hannah Amel, MD as needed  Follow up with Cardiology next week.  Please get a complete blood count and chemistry panel checked by your Primary MD at your next visit, and again as instructed by your Primary MD. Please get your medications reviewed and adjusted by your Primary MD.  Please request your Primary MD to go over all Hospital Tests and Procedure/Radiological results at the follow up, please get all Hospital records sent to your Prim MD by signing hospital release before you go home.  If you had Pneumonia of Lung problems at the Hospital: Please get a 2 view Chest X ray done in 6-8 weeks after hospital discharge or sooner if instructed by your Primary MD.  If you have Congestive Heart Failure: Please call your Cardiologist or Primary MD anytime you have any of the following symptoms:  1) 3 pound weight gain in 24 hours or 5 pounds in 1 week  2) shortness of breath, with or without a dry hacking cough  3) swelling in the hands, feet or stomach  4) if you have to sleep on extra pillows at night in order to breathe  Follow cardiac low salt diet and 1.5 lit/day fluid restriction.  If you have diabetes Accuchecks 4 times/day, Once in AM empty stomach and then before each meal. Log in all results and show them to your primary doctor at your next visit. If any glucose reading is under 80 or above 300 call your primary MD immediately.  If you have Seizure/Convulsions/Epilepsy: Please do not drive, operate heavy machinery, participate in activities at heights or participate in high speed sports until you have seen by Primary MD or a Neurologist and advised to do so again.  If you had Gastrointestinal Bleeding: Please ask your Primary MD to check a complete blood count within one week of discharge or at your next visit. Your endoscopic/colonoscopic biopsies that are pending at the time of discharge, will also need to followed by your Primary MD.  Get Medicines reviewed and  adjusted. Please take all your medications with you for your next visit with your Primary MD  Please request your Primary MD to go over all hospital tests and procedure/radiological results at the follow up, please ask your Primary MD to get all Hospital records sent to his/her office.  If you experience worsening of your admission symptoms, develop shortness of breath, life threatening emergency, suicidal or homicidal thoughts you must seek medical attention immediately by calling 911 or calling your MD immediately  if symptoms less severe.  You must read complete instructions/literature along with all the possible adverse reactions/side effects for all the Medicines you take and that have been prescribed to you. Take any new Medicines after you have completely understood and accpet all the possible adverse reactions/side effects.   Do not drive or operate heavy machinery when taking Pain medications.   Do not take more than prescribed Pain, Sleep and Anxiety Medications  Special Instructions: If you have smoked or chewed Tobacco  in the last 2 yrs please stop smoking, stop any regular Alcohol  and or any Recreational drug use.  Wear Seat belts while driving.  Please note You were cared for by a hospitalist during your hospital stay. If you have any questions about your discharge medications or the care you received while you were in the hospital after you are discharged, you can call the unit and asked to speak with the hospitalist on call if the hospitalist that took care of  you is not available. Once you are discharged, your primary care physician will handle any further medical issues. Please note that NO REFILLS for any discharge medications will be authorized once you are discharged, as it is imperative that you return to your primary care physician (or establish a relationship with a primary care physician if you do not have one) for your aftercare needs so that they can reassess your need  for medications and monitor your lab values.  You can reach the hospitalist office at phone [phone] or fax [phone]   If you do not have a primary care physician, you can call [phone] for a physician referral.  Activity: As tolerated with Full fall precautions use walker/cane & assistance as needed  Diet: heart healthy  Disposition Home

## 2015-12-12 NOTE — Discharge Summary (Signed)
Physician Discharge Summary  Hannah Khan TKW:[id number] DOB: August 19, 1956 DOA: 12/08/2015  PCP: Lujean Amel, MD  Admit date: 12/08/2015 Discharge date: 12/12/2015  Time spent: > 30 minutes  Recommendations for Outpatient Follow-up:  1. Follow up with Dr. Oval Linsey in 1 week  2. Outpatient follow-up with gastroenterology, hematology as needed  Discharge Diagnoses:  Principal Problem:   Atrial fibrillation with RVR ([place]) Active Problems:   Primary thrombocytopenia ([place])   Obstructive sleep apnea   Essential hypertension   DIABETES MELLITUS, BORDERLINE   Cirrhosis of liver, with portal HTN & Acites, by CT Abdomen 07/20/13   Atrial flutter ([place])   Morbid obesity- BMI 56  Discharge Condition: stable  Diet recommendation: heart healthy  Filed Weights   12/10/15 0500 12/11/15 0533 12/12/15 0510  Weight: 162.388 kg (358 lb) 163.1 kg (359 lb 9.1 oz) 163.4 kg (360 lb 3.7 oz)    History of present illness:  See H&P, Labs, Consult and Test reports for all details in brief, patient is a 60 yo F with liver cirrhosis, thrombocytopenia, admitted on 3/21 with new onset A fib/flutter with RVR  Hospital Course:  A fib with RVR - new onset, cardiology was consulted and have followed patient while hospitalized. She was started on diltiazem, continued on her propranolol. Borderline low blood pressure is limited up titration of her diltiazem, and she was started on digoxin 3/24 for poorly controlled rates. ChadsVasc score 3. Patient is not a candidate for anticoagulation, she has profound thrombocytopenia as well as liver cirrhosis with portal hypertensive gastropathy and high risk of GI bleeding. Cardiology initially evaluated patient for cardioversion, however due to inability to anticoagulate the approach was 4 rate control. Her heart rate has been in the 70s and 80s while at rest on diltiazem, propranolol and digoxin, increasing to 110-120 with ambulation. Patient is completely asymptomatic.  Discussed with cardiology, stable to discharge home, they will follow patient up in their clinic next week and check a digoxin level to see if it needs up titration. She underwent a 2-D echo which showed normal ejection fraction of 55-60%. Thrombocytopenia- Appears to be chronic, at baseline, no evidence of active bleed, followed by Dr. Osker Mason from hematology, felt to be secondary to liver cirrhosis. No bleeding. Liver cirrhosis due to [facility] with portal hypertension - Does not appear to be encephalopathic, LFTs within normal limits, INR and PTT with normal limits. Last EGD 2014 with diffuse portal gastropathy and gastric varices. I discussed with patient's primary gastroenterologist Dr. Paulita Fujita 3/22 who would NOT recommend anticoagulation as the patient would be very high risk of a fatal bleeding event. Diabetes mellitus, borderline - She reports is controlled with diet, most recent A1c at PCP office was 5.7 Obstructive sleep apnea - Continue C Pap  Procedures:  2-D echo   Consultations:  Cardiology  GI (phone)  Discharge Exam: Filed Vitals:   12/11/15 2119 12/12/15 0510 12/12/15 0900 12/12/15 1057  BP:  114/53 116/54 116/54  Pulse:  128 86 86  Temp:  97.5 F (36.4 C) 97.7 F (36.5 C)   TempSrc:  Oral Oral   Resp: 18 20 20    Height:      Weight:  163.4 kg (360 lb 3.7 oz)    SpO2:  99% 98%     General: NAD Cardiovascular: irregular Respiratory: CTA biL  Discharge Instructions Activity:  As tolerated   Get Medicines reviewed and adjusted: Please take all your medications with you for your next visit with your Primary MD  Please  request your Primary MD to go over all hospital tests and procedure/radiological results at the follow up, please ask your Primary MD to get all Hospital records sent to his/her office.  If you experience worsening of your admission symptoms, develop shortness of breath, life threatening emergency, suicidal or homicidal thoughts you must seek medical  attention immediately by calling 911 or calling your MD immediately if symptoms less severe.  You must read complete instructions/literature along with all the possible adverse reactions/side effects for all the Medicines you take and that have been prescribed to you. Take any new Medicines after you have completely understood and accpet all the possible adverse reactions/side effects.   Do not drive when taking Pain medications.   Do not take more than prescribed Pain, Sleep and Anxiety Medications  Special Instructions: If you have smoked or chewed Tobacco in the last 2 yrs please stop smoking, stop any regular Alcohol and or any Recreational drug use.  Wear Seat belts while driving.  Please note  You were cared for by a hospitalist during your hospital stay. Once you are discharged, your primary care physician will handle any further medical issues. Please note that NO REFILLS for any discharge medications will be authorized once you are discharged, as it is imperative that you return to your primary care physician (or establish a relationship with a primary care physician if you do not have one) for your aftercare needs so that they can reassess your need for medications and monitor your lab values.    Medication List    TAKE these medications        acetaminophen 500 MG tablet  Commonly known as:  TYLENOL  Take 1,000 mg by mouth every 6 (six) hours as needed for moderate pain or headache.     Co Q 10 100 MG Caps  Take 100 mg by mouth daily.     cyanocobalamin 1000 MCG tablet  Take 1,000 mcg by mouth daily.     digoxin 0.25 MG tablet  Commonly known as:  LANOXIN  Take 1 tablet (0.25 mg total) by mouth daily.     diltiazem 360 MG 24 hr capsule  Commonly known as:  CARDIZEM CD  Take 1 capsule (360 mg total) by mouth daily.     furosemide 40 MG tablet  Commonly known as:  LASIX  Take 40 mg by mouth daily with breakfast.     Iron 325 (65 Fe) MG Tabs  Take 1 tablet by  mouth daily.     methocarbamol 500 MG tablet  Commonly known as:  ROBAXIN  Take 250-750 mg by mouth daily as needed for muscle spasms.     MULTIVITAMIN PO  Take 1 tablet by mouth daily.     propranolol 10 MG tablet  Commonly known as:  INDERAL  Take 10 mg by mouth 2 (two) times daily.     spironolactone 25 MG tablet  Commonly known as:  ALDACTONE  Take 25 mg by mouth daily.     Vitamin D 2000 units tablet  Take 2,000 Units by mouth daily.           Follow-up Information    Follow up with Sharol Harness, MD In 1 week.   Specialty:  Cardiology   Contact information:   [address] [place] [address] [phone]       Follow up with Landry Dyke, MD.   Specialty:  Gastroenterology   Why:  As needed   Contact  information:   [address] [place] [address] [phone]       Follow up with [facility], MD.   Specialty:  Oncology   Why:  As needed   Contact information:   [address] [place] [place] [address] [phone]       The results of significant diagnostics from this hospitalization (including imaging, microbiology, ancillary and laboratory) are listed below for reference.    Significant Diagnostic Studies: Dg Chest Portable 1 View  12/08/2015  CLINICAL DATA:  Tachycardia.  Hypertension. EXAM: PORTABLE CHEST 1 VIEW COMPARISON:  Jan 29, 2015 FINDINGS: There is no edema or consolidation. There is cardiomegaly. Pulmonary vascularity is normal. No adenopathy. No bone lesions. IMPRESSION: Moderate generalized cardiac enlargement. No edema or consolidation. Electronically Signed   By: Lowella Grip III M.D.   On: 12/08/2015 16:12    Microbiology: Recent Results (from the past 240 hour(s))  MRSA PCR Screening     Status: None   Collection Time: 12/08/15 10:09 PM  Result Value Ref Range Status   MRSA by PCR NEGATIVE NEGATIVE Final    Comment:        The GeneXpert MRSA Assay (FDA approved for NASAL  specimens only), is one component of a comprehensive MRSA colonization surveillance program. It is not intended to diagnose MRSA infection nor to guide or monitor treatment for MRSA infections.      Labs: Basic Metabolic Panel:  Recent Labs Lab 12/08/15 1551 12/09/15 0331 12/09/15 1130 12/10/15 0420  NA 141 137  --  138  K 4.2 3.8  --  4.4  CL 107 107  --  106  CO2 26 24  --  24  GLUCOSE 112* 127*  --  122*  BUN 18 12  --  16  CREATININE 0.73 0.76  --  0.74  CALCIUM 9.6 8.8*  --  8.6*  MG 2.0  --  1.7  --   PHOS 3.7  --   --   --    Liver Function Tests:  Recent Labs Lab 12/08/15 1551 12/09/15 0331  AST 45* 42*  ALT 25 23  ALKPHOS 62 55  BILITOT 2.1* 2.7*  PROT 7.5 6.4*  ALBUMIN 3.6 3.1*   CBC:  Recent Labs Lab 12/08/15 1551 12/09/15 0331 12/10/15 0420 12/11/15 0655 12/12/15 0406  WBC 4.4 4.6 4.4 4.5 4.5  NEUTROABS 2.8  --   --   --   --   HGB 12.6 12.1 11.4* 11.8* 11.5*  HCT 37.1 36.0 35.2* 34.9* 34.0*  MCV 89.4 91.1 91.7 90.4 90.7  PLT 25* PLATELET CLUMPS NOTED ON SMEAR, COUNT APPEARS DECREASED PLATELET CLUMPING, SUGGEST RECOLLECTION OF SAMPLE IN CITRATE TUBE. PLATELET CLUMPING, SUGGEST RECOLLECTION OF SAMPLE IN CITRATE TUBE. PLATELET CLUMPS NOTED ON SMEAR, UNABLE TO ESTIMATE   Cardiac Enzymes:  Recent Labs Lab 12/08/15 2128 12/09/15 0331 12/09/15 1130  TROPONINI <0.03 <0.03 <0.03   CBG:  Recent Labs Lab 12/10/15 1648 12/11/15 0724 12/11/15 1118 12/11/15 1731 12/12/15 0803  GLUCAP 110* 114* 147* 102* 115*   Signed:  GHERGHE, COSTIN  [facility] 12/12/2015, 12:16 PM

## 2015-12-12 NOTE — Progress Notes (Signed)
PATIENT ID: [id number] with NASH, portal hypertension, cirrhosis, ITP, OSA on CPAP, hypertension, diabetes mellitus type 2, and hyperlipidemia here with new onset atrial fibrillation.  INTERVAL HISTORY:  Started on digoxin IV yesterday.  Rate mostly well-controlled.  Intermittently rates up to 120s.    SUBJECTIVE:  Feels well.  Denies palpitations, chest pain or shortness of breath.  She is unaware of when her heart rate is poorly controlled.  PHYSICAL EXAM Filed Vitals:   12/11/15 2119 12/12/15 0510 12/12/15 0900 12/12/15 1057  BP:  114/53 116/54 116/54  Pulse:  128 86 86  Temp:  97.5 F (36.4 C) 97.7 F (36.5 C)   TempSrc:  Oral Oral   Resp: 18 20 20    Height:      Weight:  163.4 kg (360 lb 3.7 oz)    SpO2:  99% 98%    General:  Well-appearing.  No acute distress.  Neck: No JVD.   Lungs:  CTAB.  No crackles, rhonchi or wheezes Heart:  Irregularly irregular.  Tachycardic.  No m/r.g  Abdomen:  Obese.  Soft, NT, ND.  +BS Extremities:  WWP.  Trace edema   LABS: Lab Results  Component Value Date   TROPONINI <0.03 12/09/2015   Results for orders placed or performed during the hospital encounter of 12/08/15 (from the past 24 hour(s))  Glucose, capillary     Status: Abnormal   Collection Time: 12/11/15 11:18 AM  Result Value Ref Range   Glucose-Capillary 147 (H) 65 - 99 mg/dL  Urinalysis, Routine w reflex microscopic (not at [facility])     Status: Abnormal   Collection Time: 12/11/15  2:44 PM  Result Value Ref Range   Color, Urine YELLOW YELLOW   APPearance CLEAR CLEAR   Specific Gravity, Urine 1.010 1.005 - 1.030   pH 5.5 5.0 - 8.0   Glucose, UA NEGATIVE NEGATIVE mg/dL   Hgb urine dipstick TRACE (A) NEGATIVE   Bilirubin Urine NEGATIVE NEGATIVE   Ketones, ur NEGATIVE NEGATIVE mg/dL   Protein, ur NEGATIVE NEGATIVE mg/dL   Nitrite NEGATIVE NEGATIVE   Leukocytes, UA NEGATIVE NEGATIVE  Urine microscopic-add on     Status: Abnormal   Collection Time: 12/11/15  2:44 PM  Result Value  Ref Range   Squamous Epithelial / LPF 0-5 (A) NONE SEEN   WBC, UA 0-5 0 - 5 WBC/hpf   RBC / HPF 0-5 0 - 5 RBC/hpf   Bacteria, UA NONE SEEN NONE SEEN  Glucose, capillary     Status: Abnormal   Collection Time: 12/11/15  5:31 PM  Result Value Ref Range   Glucose-Capillary 102 (H) 65 - 99 mg/dL  CBC     Status: Abnormal   Collection Time: 12/12/15  4:06 AM  Result Value Ref Range   WBC 4.5 4.0 - 10.5 K/uL   RBC 3.75 (L) 3.87 - 5.11 MIL/uL   Hemoglobin 11.5 (L) 12.0 - 15.0 g/dL   HCT 34.0 (L) 36.0 - 46.0 %   MCV 90.7 78.0 - 100.0 fL   MCH 30.7 26.0 - 34.0 pg   MCHC 33.8 30.0 - 36.0 g/dL   RDW 15.8 (H) 11.5 - 15.5 %   Platelets PLATELET CLUMPS NOTED ON SMEAR, UNABLE TO ESTIMATE 150 - 400 K/uL  Glucose, capillary     Status: Abnormal   Collection Time: 12/12/15  8:03 AM  Result Value Ref Range   Glucose-Capillary 115 (H) 65 - 99 mg/dL   Comment 1 Notify RN     Intake/Output Summary (Last  24 hours) at 12/12/15 1115 Last data filed at 12/12/15 0900  Gross per 24 hour  Intake   1100 ml  Output   1775 ml  Net   -675 ml    Telemetry: Atrial fibrillation.  Rate 80s-120s.   Echo 12/10/15: Study Conclusions  - Procedure narrative: Transthoracic echocardiography. The study  was technically difficult, as a result of body habitus.  Intravenous contrast (Definity) was administered. - Left ventricle: The cavity size was normal. Wall thickness was  increased in a pattern of mild LVH. Systolic function was normal.  The estimated ejection fraction was in the range of 55% to 60%.  Wall motion was normal; there were no regional wall motion  abnormalities. The study is not technically sufficient to allow  evaluation of LV diastolic function. - Mitral valve: Mildly thickened leaflets . There was trivial  regurgitation. - Left atrium: The atrium was mildly dilated. - Inferior vena cava: The vessel was dilated. The respirophasic  diameter changes were blunted (< 50%), consistent  with elevated  central venous pressure. - Pericardium, extracardiac: A trivial pericardial effusion was  identified posterior to the heart.  ASSESSMENT AND PLAN:  Principal Problem:   Atrial fibrillation with RVR ([facility]) Active Problems:   Primary thrombocytopenia ([facility])   Obstructive sleep apnea   Essential hypertension   DIABETES MELLITUS, BORDERLINE   Cirrhosis of liver, with portal HTN & Acites, by CT Abdomen 07/20/13   Atrial flutter ([place])   Morbid obesity- BMI 56    # Atrial fibrillation:  Ms. Teare's heart rate is mostly well controlled. However, with exertion it increases to the 120s. She is asymptomatic at that time.  She continues on diltiazem and propranolol. She can be converted to diltiazem 360 CD.  Continue propranolol at current dosing.  Given her low BP, will not increase nodal agents.  She will start digoxin 0.25 mg daily today. We will follow her in clinic this week and check a digoxin level so that this can be adjusted.  No anticoagulation due to extremely high bleeding risk.     Time spent: 20 minutes-Greater than 50% of this time was spent in counseling, explanation of diagnosis, planning of further management, and coordination of care.    Avory Rahimi C. Oval Linsey, MD, [facility] 12/12/2015 11:15 AM

## 2015-12-16 ENCOUNTER — Encounter: Payer: Self-pay | Admitting: Cardiovascular Disease

## 2015-12-16 ENCOUNTER — Ambulatory Visit (INDEPENDENT_AMBULATORY_CARE_PROVIDER_SITE_OTHER): Payer: BC Managed Care – PPO | Admitting: Cardiovascular Disease

## 2015-12-16 VITALS — BP 104/60 | HR 74 | Ht 67.5 in | Wt 354.4 lb

## 2015-12-16 DIAGNOSIS — I480 Paroxysmal atrial fibrillation: Secondary | ICD-10-CM | POA: Diagnosis not present

## 2015-12-16 DIAGNOSIS — Z79899 Other long term (current) drug therapy: Secondary | ICD-10-CM

## 2015-12-16 DIAGNOSIS — Z5181 Encounter for therapeutic drug level monitoring: Secondary | ICD-10-CM

## 2015-12-16 DIAGNOSIS — K7469 Other cirrhosis of liver: Secondary | ICD-10-CM

## 2015-12-16 DIAGNOSIS — I10 Essential (primary) hypertension: Secondary | ICD-10-CM | POA: Diagnosis not present

## 2015-12-16 MED ORDER — DILTIAZEM HCL ER COATED BEADS 240 MG PO CP24: 240.0000 mg | ORAL_CAPSULE | Freq: Every day | ORAL | Status: DC

## 2015-12-16 NOTE — Progress Notes (Signed)
Cardiology Office Note   Date:  12/16/2015   ID:  Hannah Khan, DOB [birth year], MRN [id number]  PCP:  Hannah Amel, MD  Cardiologist:   Hannah Harness, MD   Chief Complaint  Patient presents with  . Follow-up    1 week post ED  pt states she can feel when her heart is beating faster; c/o SOB on exertion, swelling in both feet--usually takes lasix/spironolactone around 2 pm      History of Present Illness: Hannah Khan is a 60 y.o. female with hypertension, atrial fibrillation, diabetes mellitus type 2, hyperlipidemia, cryptogenic cirrhosis, thrombocytopenia, and morbid obesity who presents for follow-up on atrial fibrillation with rapid ventricular response. Hannah Khan was admitted to the hospital 3/21 through 3/25 with atrial fibrillation with rapid ventricular response. Given her history of portal hypertension and cirrhosis she was not anticoagulated. She was started on diltiazem for rate control and continued on her home propranolol. Her rate did not stay consistently less than 100 bpm so she was started on digoxin as well.  Echo revealed normal systolic function and her thyroid function was normal.  Since discharge Ms. Hannah Khan has noted increased swelling in her legs, especially in the left leg. She also notes increased fatigue and dyspnea on exertion. She denies chest pain or pressure.  She has noted a "butterfly feeling" in her chest when she feels stressed.  This sometimes occurred prior to her hospitalization. She has been checking her pressure and heart rate regularly. Initially after discharge her blood pressure ranged from 117-129. Recently it has been between 61 and 73. Her blood pressure has been low lately in the 90s over 50s. She denies lightheadedness or dizziness.   Past Medical History  Diagnosis Date  . Fibromyalgia   . Hx of colonic polyps   . HTN (hypertension)   . ITP (idiopathic thrombocytopenic purpura)   . Sleep apnea     uses cpap setting of  14  . Liver cirrhosis secondary to NASH (nonalcoholic steatohepatitis) dx nov 2014    had enlarged spleen also   . PONV (postoperative nausea and vomiting)     in past none recent  . Diabetes mellitus without complication (Hannah Khan)   . Hyperlipidemia   . Portal hypertension (HCC)     Past Surgical History  Procedure Laterality Date  . Colectomy  2011  . Knee arthroscopy  yrs ago    bilateral, one done x 1, one done twice  . Cholecystectomy  20 yrs ago  . Cesarean section  1989  . Esophagogastroduodenoscopy (egd) with propofol N/A 09/04/2013    Procedure: ESOPHAGOGASTRODUODENOSCOPY (EGD) WITH PROPOFOL;  Surgeon: [name], MD;  Location: [facility];  Service: Endoscopy;  Laterality: N/A;     Current Outpatient Prescriptions  Medication Sig Dispense Refill  . acetaminophen (TYLENOL) 500 MG tablet Take 1,000 mg by mouth every 6 (six) hours as needed for moderate pain or headache.    . Cholecalciferol (VITAMIN D) 2000 UNITS tablet Take 2,000 Units by mouth daily.    . Coenzyme Q10 (CO Q 10) 100 MG CAPS Take 100 mg by mouth daily.    . cyanocobalamin 1000 MCG tablet Take 1,000 mcg by mouth daily.    . digoxin (LANOXIN) 0.25 MG tablet Take 1 tablet (0.25 mg total) by mouth daily. 30 tablet 1  . diltiazem (CARDIZEM CD) 240 MG 24 hr capsule Take 1 capsule (240 mg total) by mouth daily. 30 capsule 5  . Ferrous Sulfate (IRON) 325 (65 FE)  MG TABS Take 1 tablet by mouth daily.    . furosemide (LASIX) 40 MG tablet Take 40 mg by mouth daily with breakfast.    . methocarbamol (ROBAXIN) 500 MG tablet Take 250-750 mg by mouth daily as needed for muscle spasms.     . Multiple Vitamins-Minerals (MULTIVITAMIN PO) Take 1 tablet by mouth daily.    . propranolol (INDERAL) 10 MG tablet Take 10 mg by mouth 2 (two) times daily.   5  . spironolactone (ALDACTONE) 25 MG tablet Take 25 mg by mouth daily.      No current facility-administered medications for this visit.    Allergies:   Celecoxib;  Prednisone; Shellfish allergy; and Statins    Social History:  The patient  reports that she quit smoking about 41 years ago. Her smoking use included Cigarettes. She has a 3 pack-year smoking history. She has never used smokeless tobacco. She reports that she does not drink alcohol or use illicit drugs.   Family History:  [name] family history includes Cancer in her brother, father, and mother; Heart failure in her mother; Hyperlipidemia in her brother and mother; Lung cancer in her father; Stroke in her other.    ROS:  Please see the history of present illness.   Otherwise, review of systems are positive for none.   All other systems are reviewed and negative.    PHYSICAL EXAM: VS:  BP 104/60 mmHg  Pulse 74  Ht 5' 7.5" (1.715 m)  Wt 160.755 kg (354 lb 6.4 oz)  BMI 54.66 kg/m2 , BMI Body mass index is 54.66 kg/(m^2). GENERAL:  Well appearing. Morbidly obese HEENT:  Pupils equal round and reactive, fundi not visualized, oral mucosa unremarkable NECK:  No jugular venous distention, waveform within normal limits, carotid upstroke brisk and symmetric, no bruits, no thyromegaly LYMPHATICS:  No cervical adenopathy LUNGS:  Clear to auscultation bilaterally HEART:  Irregularly irregular. PMI not displaced or sustained,S1 and S2 within normal limits, no S3, no S4, no clicks, no rubs, no murmurs ABD:  Flat, positive bowel sounds normal in frequency in pitch, no bruits, no rebound, no guarding, no midline pulsatile mass, no hepatomegaly, no splenomegaly EXT:  2 plus pulses throughout, no edema, no cyanosis no clubbing SKIN:  No rashes no nodules NEURO:  Cranial nerves II through XII grossly intact, motor grossly intact throughout PSYCH:  Cognitively intact, oriented to person place and time   EKG:  EKG is not ordered today.  Echo 12/10/15: Study Conclusions  - Procedure narrative: Transthoracic echocardiography. The study  was technically difficult, as a result of body habitus.   Intravenous contrast (Definity) was administered. - Left ventricle: The cavity size was normal. Wall thickness was  increased in a pattern of mild LVH. Systolic function was normal.  The estimated ejection fraction was in the range of 55% to 60%.  Wall motion was normal; there were no regional wall motion  abnormalities. The study is not technically sufficient to allow  evaluation of LV diastolic function. - Mitral valve: Mildly thickened leaflets . There was trivial  regurgitation. - Left atrium: The atrium was mildly dilated. - Inferior vena cava: The vessel was dilated. The respirophasic  diameter changes were blunted (< 50%), consistent with elevated  central venous pressure. - Pericardium, extracardiac: A trivial pericardial effusion was  identified posterior to the heart.  Impressions:  - Technically difficult study. The patient was in atrial flutter  during the study. The LVEF is 55-60%, mild LVH, grossly normal  wall  motion, trivial MR, mild LAE, dilated IVC, trivial posterior  pericardial effusion. Recent Labs: 12/09/2015: ALT 23; Magnesium 1.7; TSH 1.590 12/10/2015: BUN 16; Creatinine, Ser 0.74; Potassium 4.4; Sodium 138 12/12/2015: Hemoglobin 11.5*; Platelets PLATELET CLUMPS NOTED ON SMEAR, UNABLE TO ESTIMATE    Lipid Panel    Component Value Date/Time   CHOL 175 07/02/2014 1138   TRIG 93 07/02/2014 1138   HDL 59 07/02/2014 1138   CHOLHDL 3.0 07/02/2014 1138   VLDL 19 07/02/2014 1138   LDLCALC 97 07/02/2014 1138      Wt Readings from Last 3 Encounters:  12/16/15 160.755 kg (354 lb 6.4 oz)  12/12/15 163.4 kg (360 lb 3.7 oz)  12/08/15 162.841 kg (359 lb)      ASSESSMENT AND PLAN:  # Paroxysmal atrial fibrillation: Ms. Davidovich' heart rate is better controlled on digoxin and diltiazem. We will check a digoxin level today. Her blood pressure has been low and she reports fatigue. We will reduce the dose of diltiazem to 240 mg daily. She is not on  anticoagulation due to her cirrhosis and portal hypertension. Her gastroenterologist reported that her risk of severe bleeding was extremely high.  This patients CHA2DS2-VASc Score and unadjusted Ischemic Stroke Rate (% per year) is equal to 2.2 % stroke rate/year from a score of 2  Above score calculated as 1 point each if present [CHF, HTN, DM, Vascular=MI/PAD/Aortic Plaque, Age if 65-74, or Female] Above score calculated as 2 points each if present [Age > 75, or Stroke/TIA/TE]  # Hypertension: Ms. Lupu' blood pressure has been low. Given that she has been reporting fatigue we will reduce diltiazem to 240 mg by mouth daily.  # Chronic diastolic heart failure, LE edema: Ms. Hoback notes increased lower extremity edema. Her JVP is not elevated and her lungs are clear. I suspect that her lower extremity edema is more related to chronic venous insufficiency. We recommended TED hose and will increase her Lasix to 40 mg twice a day on Saturday and Sunday. 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  Current medicines are reviewed at length with the patient today.  The patient does not have concerns regarding medicines.  The following changes have been made:  Reduce diltiazem to 240 mg daily.  Labs/ tests ordered today include:   Orders Placed This Encounter  Procedures  . Digoxin level     Disposition:   FU with Ulysees Robarts C. Oval Linsey, MD, [facility] in 3 months.    This note was written with the assistance of speech recognition software.  Please excuse any transcriptional errors.  Signed, Aalani Aikens C. Oval Linsey, MD, [facility]  12/16/2015 2:08 PM    [facility]

## 2015-12-16 NOTE — Patient Instructions (Signed)
Medication Instructions:  DECREASE YOUR DILTIAZEM TO 240 MG DAILY  ON Saturday AND Sunday TAKE YOUR FUROSEMIDE TWICE A DAY  Labwork: DIGOXIN LEVEL ON FIRST FLOOR AT [facility] TODAY  Testing/Procedures: NONE  Follow-Up: Your physician recommends that you schedule a follow-up appointment in:  [place]  If you need a refill on your cardiac medications before your next appointment, please call your pharmacy.

## 2015-12-17 ENCOUNTER — Telehealth: Payer: Self-pay | Admitting: *Deleted

## 2015-12-17 DIAGNOSIS — Z79899 Other long term (current) drug therapy: Secondary | ICD-10-CM

## 2015-12-17 LAB — DIGOXIN LEVEL: DIGOXIN LVL: 0.7 ug/L — AB (ref 0.8–2.0)

## 2015-12-17 MED ORDER — DIGOXIN 250 MCG PO TABS: 0.3750 mg | ORAL_TABLET | Freq: Every day | ORAL | Status: DC

## 2015-12-17 NOTE — Telephone Encounter (Signed)
Advised patient and orders for follow up labs in Epic

## 2015-12-17 NOTE — Telephone Encounter (Signed)
-----   Message from Skeet Latch, MD sent at 12/17/2015  5:20 PM EDT ----- Digoxin level is a little low. Increase dose to 0. 375 mg daily. Repeat level in one week.  Make sure the lab is drawn either just before her next dose for at least 6 hours after the last dose.

## 2015-12-26 LAB — DIGOXIN LEVEL: DIGOXIN LVL: 0.9 ug/L (ref 0.8–2.0)

## 2015-12-29 ENCOUNTER — Telehealth: Payer: Self-pay | Admitting: Cardiovascular Disease

## 2015-12-29 NOTE — Telephone Encounter (Signed)
Advised patient, verbalized understanding  

## 2015-12-29 NOTE — Telephone Encounter (Signed)
Would continue to monitor for now. Having some paroxysmal Afib. Rate is not too high. meds adjusted on last visit so would continue for now. If symptoms worsen let Dr. Oval Linsey know.  Lakeyn Dokken [place] MD, [facility]

## 2015-12-29 NOTE — Telephone Encounter (Signed)
New message   Patient c/o Palpitations:  High priority if patient c/o lightheadedness and shortness of breath.  1. How long have you been having palpitations? 12/26/15  2. Are you currently experiencing lightheadedness and shortness of breath? Yes SOB  3. Have you checked your BP and heart rate? (document readings)  111/67            104  122  112  4. Are you experiencing any other symptoms? tired

## 2015-12-29 NOTE — Telephone Encounter (Signed)
Pt of Dr. Oval Linsey. Hx A Fib  Pt having palpitations x3 days. Notes HR when checked has been between 100-120. BP over past 3 days between 110-130/67-74.  Recent med changes - digoxin dose increased (0.25 to 0.3102m daily), diltiazem reduced from 3635mto 24057mNotes some SOB and fatigue.  Aware I will get advice on dose adjustment for meds or return OV.  Routed to Dr. JorMartiniqueoD) for review.

## 2015-12-29 NOTE — Telephone Encounter (Signed)
Melinda to f/u w recommendations.

## 2016-01-10 NOTE — Progress Notes (Signed)
Cardiology Office Note   Date:  01/11/2016   ID:  [name], DOB 11-Aug-1956, MRN [id number]  PCP:  Hannah Amel, MD  Cardiologist:   Hannah Latch, MD   Chief Complaint  Patient presents with  . Tachycardia    swelling in feet, some cramping, shortness of breath, fatigue, dizziness when blood pressure gets low    Patient ID:  Hannah Khan is a 60 y.o. female with hypertension, atrial fibrillation, diabetes mellitus type 2, hyperlipidemia, cryptogenic cirrhosis, thrombocytopenia, and morbid obesity who presents for follow-up.   Interval history 01/11/16: At the last appointment Hannah Khan's digoxin level was checked and was low.  Digoxin was increased and the follow up level was normal.  Her diltiazem dose was reduced due to low BP and fatigue.  Soon after she called in with palpitations and heart rate 100-120.  No changes were made at that time given her recent medication change.  She reports that she's been feeling extremely fatigued. By the end of the day she is completely wiped out. She has even been contemplating retiring from her job because of this tiredness. She is intermittently had episodes of increased lower extremity edema and required higher doses of Lasix.  She brings a log of her blood pressures and heart rates that showed that initially after her appointment her heart rate was in the 60s to 80s with blood pressure in the 100s to 130s/60-70s. However, since that time her heart rate has been in the 90s to 130s with blood pressures in the 80s to 110s/ 50s to 70s.  She has been miserable. She denies any chest pain but has noted some dizziness and shortness of breath.  History of Present Illness 12/16/15: Hannah Khan was admitted to the hospital 3/21 through 3/25 with atrial fibrillation with rapid ventricular response. Given her history of portal hypertension and cirrhosis she was not anticoagulated. She was started on diltiazem for rate control and continued on her  home propranolol. Her rate did not stay consistently less than 100 bpm so she was started on digoxin as well.  Echo revealed normal systolic function and her thyroid function was normal.  Since discharge Ms. Alcon has noted increased swelling in her legs, especially in the left leg. She also notes increased fatigue and dyspnea on exertion. She denies chest pain or pressure.  She has noted a "butterfly feeling" in her chest when she feels stressed.  This sometimes occurred prior to her hospitalization. She has been checking her pressure and heart rate regularly. Initially after discharge her blood pressure ranged from 117-129. Recently it has been between 39 and 2. Her blood pressure has been low lately in the 90s over 50s. She denies lightheadedness or dizziness.   Past Medical History  Diagnosis Date  . Fibromyalgia   . Hx of colonic polyps   . HTN (hypertension)   . ITP (idiopathic thrombocytopenic purpura)   . Sleep apnea     uses cpap setting of 14  . Liver cirrhosis secondary to NASH (nonalcoholic steatohepatitis) dx nov 2014    had enlarged spleen also   . PONV (postoperative nausea and vomiting)     in past none recent  . Diabetes mellitus without complication ([place])   . Hyperlipidemia   . Portal hypertension (HCC)     Past Surgical History  Procedure Laterality Date  . Colectomy  2011  . Knee arthroscopy  yrs ago    bilateral, one done x 1, one done twice  .  Cholecystectomy  20 yrs ago  . Cesarean section  1989  . Esophagogastroduodenoscopy (egd) with propofol N/A 09/04/2013    Procedure: ESOPHAGOGASTRODUODENOSCOPY (EGD) WITH PROPOFOL;  Surgeon: Hannah Silence, MD;  Location: [facility];  Service: Endoscopy;  Laterality: N/A;     Current Outpatient Prescriptions  Medication Sig Dispense Refill  . acetaminophen (TYLENOL) 500 MG tablet Take 1,000 mg by mouth every 6 (six) hours as needed for moderate pain or headache.    . Cholecalciferol (VITAMIN D) 2000 UNITS tablet  Take 2,000 Units by mouth daily.    . Coenzyme Q10 (CO Q 10) 100 MG CAPS Take 100 mg by mouth daily.    . cyanocobalamin 1000 MCG tablet Take 1,000 mcg by mouth daily.    . digoxin (LANOXIN) 0.25 MG tablet Take 1.5 tablets (0.375 mg total) by mouth daily. 45 tablet 5  . diltiazem (CARDIZEM CD) 240 MG 24 hr capsule Take 1 capsule (240 mg total) by mouth daily. 30 capsule 5  . Ferrous Sulfate (IRON) 325 (65 FE) MG TABS Take 1 tablet by mouth daily.    . furosemide (LASIX) 40 MG tablet Take 1 tablet (40 mg total) by mouth 2 (two) times daily. 60 tablet 5  . methocarbamol (ROBAXIN) 500 MG tablet Take 250-750 mg by mouth daily as needed for muscle spasms.     . Multiple Vitamins-Minerals (MULTIVITAMIN PO) Take 1 tablet by mouth daily.    . propranolol (INDERAL) 20 MG tablet Take 1 tablet (20 mg total) by mouth 2 (two) times daily. 60 tablet 5  . spironolactone (ALDACTONE) 25 MG tablet Take 25 mg by mouth daily.      No current facility-administered medications for this visit.    Allergies:   Celecoxib; Prednisone; Shellfish allergy; and Statins    Social History:  The patient  reports that she quit smoking about 41 years ago. Her smoking use included Cigarettes. She has a 3 pack-year smoking history. She has never used smokeless tobacco. She reports that she does not drink alcohol or use illicit drugs.   Family History:  [name] family history includes Cancer in her brother, father, and mother; Heart failure in her mother; Hyperlipidemia in her brother and mother; Lung cancer in her father; Stroke in her other.    ROS:  Please see the history of present illness.   Otherwise, review of systems are positive for none.   All other systems are reviewed and negative.    PHYSICAL EXAM: VS:  BP 120/60 mmHg  Pulse 112  Ht 5' 7.5" (1.715 m)  Wt 163.295 kg (360 lb)  BMI 55.52 kg/m2 , BMI Body mass index is 55.52 kg/(m^2). GENERAL:  Well appearing. Morbidly obese HEENT:  Pupils equal round and  reactive, fundi not visualized, oral mucosa unremarkable NECK:  No jugular venous distention, waveform within normal limits, carotid upstroke brisk and symmetric, no bruits, no thyromegaly LYMPHATICS:  No cervical adenopathy LUNGS:  Clear to auscultation bilaterally HEART:  Irregularly irregular.  Tachycardic PMI not displaced or sustained,S1 and S2 within normal limits, no S3, no S4, no clicks, no rubs, no murmurs ABD:  Flat, positive bowel sounds normal in frequency in pitch, no bruits, no rebound, no guarding, no midline pulsatile mass, no hepatomegaly, no splenomegaly EXT:  2 plus pulses throughout, no edema, no cyanosis no clubbing SKIN:  No rashes no nodules NEURO:  Cranial nerves II through XII grossly intact, motor grossly intact throughout PSYCH:  Cognitively intact, oriented to person place and time  EKG:  EKG is ordered today.  Atrial flutter with variable ventricular response. Rate 112 bpm. Low voltage precordial leads  Echo 12/10/15: Study Conclusions  - Procedure narrative: Transthoracic echocardiography. The study  was technically difficult, as a result of body habitus.  Intravenous contrast (Definity) was administered. - Left ventricle: The cavity size was normal. Wall thickness was  increased in a pattern of mild LVH. Systolic function was normal.  The estimated ejection fraction was in the range of 55% to 60%.  Wall motion was normal; there were no regional wall motion  abnormalities. The study is not technically sufficient to allow  evaluation of LV diastolic function. - Mitral valve: Mildly thickened leaflets . There was trivial  regurgitation. - Left atrium: The atrium was mildly dilated. - Inferior vena cava: The vessel was dilated. The respirophasic  diameter changes were blunted (< 50%), consistent with elevated  central venous pressure. - Pericardium, extracardiac: A trivial pericardial effusion was  identified posterior to the  heart.  Impressions:  - Technically difficult study. The patient was in atrial flutter  during the study. The LVEF is 55-60%, mild LVH, grossly normal  wall motion, trivial MR, mild LAE, dilated IVC, trivial posterior  pericardial effusion.  Recent Labs: 12/09/2015: ALT 23; Magnesium 1.7; TSH 1.590 12/10/2015: BUN 16; Creatinine, Ser 0.74; Potassium 4.4; Sodium 138 12/12/2015: Hemoglobin 11.5*; Platelets PLATELET CLUMPS NOTED ON SMEAR, UNABLE TO ESTIMATE    Lipid Panel    Component Value Date/Time   CHOL 175 07/02/2014 1138   TRIG 93 07/02/2014 1138   HDL 59 07/02/2014 1138   CHOLHDL 3.0 07/02/2014 1138   VLDL 19 07/02/2014 1138   LDLCALC 97 07/02/2014 1138      Wt Readings from Last 3 Encounters:  01/11/16 163.295 kg (360 lb)  12/16/15 160.755 kg (354 lb 6.4 oz)  12/12/15 163.4 kg (360 lb 3.7 oz)     ASSESSMENT AND PLAN:  # Paroxysmal atrial fibrillation: Ms. Fetters's heart rate is poorly-controlled. She is in atrial flutter with variable ventricular response today. Unfortunately, our options for rhythm control are limited due to her cryptogenic cirrhosis, portal hypertension, and esophageal varices. Her gastroenterologist reported that her risk of severe bleeding on anticoagulation  was extremely high.  We will increase her propranolol to 20 mg bid for one week. If her heart rates remain elevated she will increase to 40 mg twice a day.  Continue digoxin and diltiazem as ordered.  We discussed at length the risk of embolic stroke off anticoagulation if we attempt a rhythm control strategy. I thought about a transesophageal echo to rule out left atrial appendage thrombus prior to attempting cardioversion. However, she has esophageal varices which makes a transesophageal echo unsafe. She is not a candidate for watchman device because she cannot tolerate antiplatelet or anticoagulation agents even for a short period of time. We will refer her to electrophysiology to see if they  have any other ideas for optimization or whether she may be a candidate for ablation, understanding the risk of embolic stroke.    This patients CHA2DS2-VASc Score and unadjusted Ischemic Stroke Rate (% per year) is equal to 3.2 % stroke rate/year from a score of 3  Above score calculated as 1 point each if present [CHF, HTN, DM, Vascular=MI/PAD/Aortic Plaque, Age if 65-74, or Female] Above score calculated as 2 points each if present [Age > 75, or Stroke/TIA/TE]  # Hypertension:  Blood pressure well-controlled. Continue diltiazem. Increase propranolol as above.  # Chronic diastolic heart failure:  Increase Lasix to 40 mg twice a day. Recommended that she wear TED hose.    Current medicines are reviewed at length with the patient today.  The patient does not have concerns regarding medicines.  The following changes have been made: Increase lasix to 40 mg bid.  Increase propranolol to 52m bid.   Labs/ tests ordered today include:   Orders Placed This Encounter  Procedures  . Ambulatory referral to Cardiac Electrophysiology  . EKG 12-Lead     Disposition:   FU with Larita Deremer C. ROval Linsey [facility], [facility]    This note was written with the assistance of speech recognition software.  Please excuse any transcriptional errors.  Signed, Kenwood Rosiak C. ROval Linsey MD, [facility] 01/11/2016 6:19 PM    [place]

## 2016-01-11 ENCOUNTER — Ambulatory Visit (INDEPENDENT_AMBULATORY_CARE_PROVIDER_SITE_OTHER): Payer: BC Managed Care – PPO | Admitting: Cardiovascular Disease

## 2016-01-11 ENCOUNTER — Encounter: Payer: Self-pay | Admitting: Cardiovascular Disease

## 2016-01-11 VITALS — BP 120/60 | HR 112 | Ht 67.5 in | Wt 360.0 lb

## 2016-01-11 DIAGNOSIS — I4891 Unspecified atrial fibrillation: Secondary | ICD-10-CM

## 2016-01-11 DIAGNOSIS — K766 Portal hypertension: Secondary | ICD-10-CM

## 2016-01-11 DIAGNOSIS — K7469 Other cirrhosis of liver: Secondary | ICD-10-CM

## 2016-01-11 DIAGNOSIS — I10 Essential (primary) hypertension: Secondary | ICD-10-CM | POA: Diagnosis not present

## 2016-01-11 DIAGNOSIS — D696 Thrombocytopenia, unspecified: Secondary | ICD-10-CM

## 2016-01-11 DIAGNOSIS — I4892 Unspecified atrial flutter: Secondary | ICD-10-CM

## 2016-01-11 MED ORDER — PROPRANOLOL HCL 20 MG PO TABS: 20.0000 mg | ORAL_TABLET | Freq: Two times a day (BID) | ORAL | Status: DC

## 2016-01-11 MED ORDER — FUROSEMIDE 40 MG PO TABS
40.0000 mg | ORAL_TABLET | Freq: Two times a day (BID) | ORAL | Status: DC
Start: 2016-01-11 — End: 2016-01-30

## 2016-01-11 MED ORDER — DIGOXIN 250 MCG PO TABS: 0.3750 mg | ORAL_TABLET | Freq: Every day | ORAL | Status: DC

## 2016-01-11 NOTE — Patient Instructions (Addendum)
Medication Instructions:  INCREASE YOUR LASIX (FUROSEMIDE) TO 40 MG TWICE A DAY  INCREASE YOUR PROPRANOLOL TO 20 MG TWICE A DAY FOR 1 WEEK IF YOUR HEART RATE REMAINS ABOVE 100 AND TOP NUMBER OF BLOOD PRESSURE ABOVE 110  INCREASE TO 40 MG TWICE A DAY  Labwork: NONE  Testing/Procedures: NONE  Follow-Up: Your physician recommends that you schedule a follow-up appointment in: 1 MONTH  You have been referred to [place]   If you need a refill on your cardiac medications before your next appointment, please call your pharmacy.

## 2016-01-26 ENCOUNTER — Encounter (HOSPITAL_COMMUNITY): Payer: Self-pay | Admitting: Emergency Medicine

## 2016-01-26 ENCOUNTER — Inpatient Hospital Stay (HOSPITAL_COMMUNITY)
Admission: EM | Admit: 2016-01-26 | Discharge: 2016-01-30 | DRG: 292 | Disposition: A | Discharge status: Home / Self Care | Payer: BC Managed Care – PPO | Attending: Cardiology | Admitting: Cardiology

## 2016-01-26 ENCOUNTER — Emergency Department (HOSPITAL_COMMUNITY): Payer: BC Managed Care – PPO

## 2016-01-26 ENCOUNTER — Telehealth: Payer: Self-pay | Admitting: Cardiovascular Disease

## 2016-01-26 DIAGNOSIS — I4821 Permanent atrial fibrillation: Secondary | ICD-10-CM

## 2016-01-26 DIAGNOSIS — D693 Immune thrombocytopenic purpura: Secondary | ICD-10-CM | POA: Diagnosis present

## 2016-01-26 DIAGNOSIS — E876 Hypokalemia: Secondary | ICD-10-CM

## 2016-01-26 DIAGNOSIS — Z9049 Acquired absence of other specified parts of digestive tract: Secondary | ICD-10-CM

## 2016-01-26 DIAGNOSIS — Z888 Allergy status to other drugs, medicaments and biological substances status: Secondary | ICD-10-CM

## 2016-01-26 DIAGNOSIS — Z6841 Body Mass Index (BMI) 40.0 and over, adult: Secondary | ICD-10-CM

## 2016-01-26 DIAGNOSIS — I5043 Acute on chronic combined systolic (congestive) and diastolic (congestive) heart failure: Secondary | ICD-10-CM | POA: Diagnosis present

## 2016-01-26 DIAGNOSIS — K5900 Constipation, unspecified: Secondary | ICD-10-CM | POA: Diagnosis present

## 2016-01-26 DIAGNOSIS — Z823 Family history of stroke: Secondary | ICD-10-CM

## 2016-01-26 DIAGNOSIS — M797 Fibromyalgia: Secondary | ICD-10-CM | POA: Diagnosis present

## 2016-01-26 DIAGNOSIS — I110 Hypertensive heart disease with heart failure: Principal | ICD-10-CM | POA: Diagnosis present

## 2016-01-26 DIAGNOSIS — Z801 Family history of malignant neoplasm of trachea, bronchus and lung: Secondary | ICD-10-CM | POA: Diagnosis not present

## 2016-01-26 DIAGNOSIS — E119 Type 2 diabetes mellitus without complications: Secondary | ICD-10-CM

## 2016-01-26 DIAGNOSIS — Z87891 Personal history of nicotine dependence: Secondary | ICD-10-CM | POA: Diagnosis not present

## 2016-01-26 DIAGNOSIS — Z79899 Other long term (current) drug therapy: Secondary | ICD-10-CM | POA: Diagnosis not present

## 2016-01-26 DIAGNOSIS — I5033 Acute on chronic diastolic (congestive) heart failure: Secondary | ICD-10-CM | POA: Diagnosis not present

## 2016-01-26 DIAGNOSIS — K7460 Unspecified cirrhosis of liver: Secondary | ICD-10-CM | POA: Diagnosis present

## 2016-01-26 DIAGNOSIS — Z9889 Other specified postprocedural states: Secondary | ICD-10-CM

## 2016-01-26 DIAGNOSIS — I481 Persistent atrial fibrillation: Secondary | ICD-10-CM | POA: Diagnosis not present

## 2016-01-26 DIAGNOSIS — Z8249 Family history of ischemic heart disease and other diseases of the circulatory system: Secondary | ICD-10-CM

## 2016-01-26 DIAGNOSIS — G4730 Sleep apnea, unspecified: Secondary | ICD-10-CM | POA: Diagnosis present

## 2016-01-26 DIAGNOSIS — K7581 Nonalcoholic steatohepatitis (NASH): Secondary | ICD-10-CM | POA: Diagnosis present

## 2016-01-26 DIAGNOSIS — I4891 Unspecified atrial fibrillation: Secondary | ICD-10-CM | POA: Diagnosis present

## 2016-01-26 DIAGNOSIS — Z86010 Personal history of colonic polyps: Secondary | ICD-10-CM | POA: Diagnosis not present

## 2016-01-26 HISTORY — DX: Morbid (severe) obesity due to excess calories: E66.01

## 2016-01-26 LAB — BRAIN NATRIURETIC PEPTIDE: B NATRIURETIC PEPTIDE 5: 223.1 pg/mL — AB (ref 0.0–100.0)

## 2016-01-26 LAB — BASIC METABOLIC PANEL
ANION GAP: 11 (ref 5–15)
BUN: 11 mg/dL (ref 6–20)
CALCIUM: 8.6 mg/dL — AB (ref 8.9–10.3)
CO2: 25 mmol/L (ref 22–32)
Chloride: 99 mmol/L — ABNORMAL LOW (ref 101–111)
Creatinine, Ser: 0.83 mg/dL (ref 0.44–1.00)
GFR calc Af Amer: >60 mL/min (ref >60)
GLUCOSE: 302 mg/dL — AB (ref 65–99)
Potassium: 4 mmol/L (ref 3.5–5.1)
SODIUM: 135 mmol/L (ref 135–145)

## 2016-01-26 LAB — CBC
HCT: 37.4 % (ref 36.0–46.0)
HEMOGLOBIN: 12.4 g/dL (ref 12.0–15.0)
MCH: 30.5 pg (ref 26.0–34.0)
MCHC: 33.2 g/dL (ref 30.0–36.0)
MCV: 92.1 fL (ref 78.0–100.0)
Platelets: DECREASED 10*3/uL (ref 150–400)
RBC: 4.06 MIL/uL (ref 3.87–5.11)
RDW: 16.6 % — AB (ref 11.5–15.5)
WBC: 5.1 10*3/uL (ref 4.0–10.5)

## 2016-01-26 LAB — DIGOXIN LEVEL: DIGOXIN LVL: 1.1 ng/mL (ref 0.8–2.0)

## 2016-01-26 LAB — TROPONIN I: Troponin I: <0.03 ng/mL (ref <0.031)

## 2016-01-26 MED ORDER — FUROSEMIDE 10 MG/ML IJ SOLN
80.0000 mg | Freq: Three times a day (TID) | INTRAMUSCULAR | Status: DC
  Administered 2016-01-26 – 2016-01-30 (×11): 80 mg via INTRAVENOUS
  Filled 2016-01-26 (×11): qty 8

## 2016-01-26 MED ORDER — SPIRONOLACTONE 25 MG PO TABS
25.0000 mg | ORAL_TABLET | Freq: Every day | ORAL | Status: DC
  Administered 2016-01-27 – 2016-01-30 (×4): 25 mg via ORAL
  Filled 2016-01-26 (×4): qty 1

## 2016-01-26 MED ORDER — DOCUSATE SODIUM 100 MG PO CAPS
100.0000 mg | ORAL_CAPSULE | Freq: Two times a day (BID) | ORAL | Status: AC
  Administered 2016-01-26 – 2016-01-27 (×2): 100 mg via ORAL
  Filled 2016-01-26 (×2): qty 1

## 2016-01-26 MED ORDER — FUROSEMIDE 10 MG/ML IJ SOLN
80.0000 mg | Freq: Once | INTRAMUSCULAR | Status: AC
  Administered 2016-01-26: 80 mg via INTRAVENOUS
  Filled 2016-01-26: qty 8

## 2016-01-26 MED ORDER — CO Q 10 100 MG PO CAPS: 100.0000 mg | ORAL_CAPSULE | Freq: Every day | ORAL | Status: DC

## 2016-01-26 MED ORDER — DILTIAZEM HCL ER COATED BEADS 240 MG PO CP24
240.0000 mg | ORAL_CAPSULE | Freq: Every day | ORAL | Status: DC
  Administered 2016-01-27 – 2016-01-30 (×4): 240 mg via ORAL
  Filled 2016-01-26 (×4): qty 1

## 2016-01-26 MED ORDER — METHOCARBAMOL 500 MG PO TABS: 750.0000 mg | ORAL_TABLET | Freq: Every day | ORAL | Status: DC | PRN

## 2016-01-26 MED ORDER — FUROSEMIDE 10 MG/ML IJ SOLN: 40.0000 mg | Freq: Two times a day (BID) | INTRAMUSCULAR | Status: DC

## 2016-01-26 MED ORDER — VITAMIN B-12 1000 MCG PO TABS
1000.0000 ug | ORAL_TABLET | Freq: Every day | ORAL | Status: DC
  Administered 2016-01-27 – 2016-01-30 (×4): 1000 ug via ORAL
  Filled 2016-01-26 (×4): qty 1

## 2016-01-26 MED ORDER — DIGOXIN 125 MCG PO TABS
0.3750 mg | ORAL_TABLET | Freq: Every day | ORAL | Status: DC
  Administered 2016-01-27 – 2016-01-30 (×4): 0.375 mg via ORAL
  Filled 2016-01-26 (×4): qty 3

## 2016-01-26 MED ORDER — PROPRANOLOL HCL 20 MG PO TABS
20.0000 mg | ORAL_TABLET | Freq: Two times a day (BID) | ORAL | Status: DC
  Administered 2016-01-26 – 2016-01-30 (×8): 20 mg via ORAL
  Filled 2016-01-26 (×9): qty 1

## 2016-01-26 MED ORDER — INSULIN ASPART 100 UNIT/ML ~~LOC~~ SOLN
0.0000 [IU] | Freq: Three times a day (TID) | SUBCUTANEOUS | Status: DC
  Administered 2016-01-27: 5 [IU] via SUBCUTANEOUS
  Administered 2016-01-27 (×2): 3 [IU] via SUBCUTANEOUS
  Administered 2016-01-28 (×2): 2 [IU] via SUBCUTANEOUS
  Administered 2016-01-28: 3 [IU] via SUBCUTANEOUS
  Administered 2016-01-29: 2 [IU] via SUBCUTANEOUS
  Administered 2016-01-29: 5 [IU] via SUBCUTANEOUS
  Administered 2016-01-29 – 2016-01-30 (×2): 2 [IU] via SUBCUTANEOUS
  Administered 2016-01-30: 3 [IU] via SUBCUTANEOUS

## 2016-01-26 MED ORDER — SODIUM CHLORIDE 0.9% FLUSH
3.0000 mL | Freq: Two times a day (BID) | INTRAVENOUS | Status: DC
  Administered 2016-01-26 – 2016-01-30 (×7): 3 mL via INTRAVENOUS

## 2016-01-26 MED ORDER — VITAMIN D 1000 UNITS PO TABS
2000.0000 [IU] | ORAL_TABLET | Freq: Every day | ORAL | Status: DC
  Administered 2016-01-27 – 2016-01-30 (×4): 2000 [IU] via ORAL
  Filled 2016-01-26 (×4): qty 2

## 2016-01-26 NOTE — ED Notes (Signed)
Renal/Carb modified dinner tray ordered @ 1652.

## 2016-01-26 NOTE — Telephone Encounter (Signed)
Discussed w/ Dr. Oval Linsey and called pt w/ recommendation for ED evaluation. Pt verbalized understanding and thanks.

## 2016-01-26 NOTE — ED Notes (Signed)
Attempted report x1. 

## 2016-01-26 NOTE — H&P (Signed)
CARDIOLOGY ADMISSION NOTE  Patient ID: KEMIAH BOOZ MRN: [id number] DOB/AGE: Aug 16, 1956 60 y.o.  Admit date: 01/26/2016 Primary Physician : Dr. Dorthy Cooler   Primary Cardiologist   Dr. Oval Linsey Chief Complaint    Shortness of Breath  HPI: Ms. Adamec is a 60 yo female with PMHx of atrial fibrillation not on anticoagulation d/t ITP, T2DM, HTN, OSA on CPAP, liver cirrhosis 2/2 NASH, thrombocytopenia d/t ITP versus cirrhosis, h/o tubular adenoma s/p parital colectomy, and morbid obesity who presented to [facility] on 5/9 with complaint of shortness of breath. Patient states that for the past one week she has developed progressive shortness of breath, DOE, lower extremity and abdominal swelling with weeping blisters, weakness and fatigue. She had one episode of sharp, non-radiating epigastric/inferior sternal chest pain that lasted for 7 minutes last night and then a few seconds today. It occurred at rest and resolved on its own. It was not associated with increased shortness of breath, nausea, vomiting, lightheadedness, or diaphoresis. She admits to a 14 pound weight gain since 4/27. She states her baseline weight is 350 lbs. She denies a history of chest pain. She denies orthopnea. Prior to one week ago, patient was her normal self. She is a Pharmacist, hospital at [employer] and frequently walks across campus with minimal dyspnea and no chest pain. She admits to increased salt intake recently but denies a significant lifestyle change. On 4/27 her propranolol was increased. She has been taking all over her medications as prescribed. Patient received lasix 80 mg IV once in the ED.   Past Medical History  Diagnosis Date  . Fibromyalgia   . Hx of colonic polyps     s/p partial colectomy  . HTN (hypertension)   . ITP (idiopathic thrombocytopenic purpura)   . Sleep apnea     uses cpap setting of 14  . Liver cirrhosis secondary to NASH (nonalcoholic steatohepatitis) dx nov 2014    had enlarged spleen also   . PONV  (postoperative nausea and vomiting)     in past none recent  . Diabetes mellitus without complication ([place])   . Hyperlipidemia   . Portal hypertension ([place])   . Atrial fibrillation ([place])   . Morbid obesity [facility])     Past Surgical History  Procedure Laterality Date  . Colectomy  2011  . Knee arthroscopy  yrs ago    bilateral, one done x 1, one done twice  . Cholecystectomy  20 yrs ago  . Cesarean section  1989  . Esophagogastroduodenoscopy (egd) with propofol N/A 09/04/2013    Procedure: ESOPHAGOGASTRODUODENOSCOPY (EGD) WITH PROPOFOL;  Surgeon: [name], MD;  Location: [facility];  Service: Endoscopy;  Laterality: N/A;    Allergies  Allergen Reactions  . Celecoxib Other (See Comments)    "speech slurred" with celebrex  . Prednisone Other (See Comments)    Rapid heart rate  . Shellfish Allergy Swelling    Lips tingle and swell. Whelps the size of quarters.   . Statins Other (See Comments)    aggravated fibromyalgia   No current facility-administered medications on file prior to encounter.   Current Outpatient Prescriptions on File Prior to Encounter  Medication Sig Dispense Refill  . Cholecalciferol (VITAMIN D) 2000 UNITS tablet Take 2,000 Units by mouth daily.    . Coenzyme Q10 (CO Q 10) 100 MG CAPS Take 100 mg by mouth daily.    . cyanocobalamin 1000 MCG tablet Take 1,000 mcg by mouth daily.    . digoxin (LANOXIN) 0.25 MG tablet Take  1.5 tablets (0.375 mg total) by mouth daily. 45 tablet 5  . diltiazem (CARDIZEM CD) 240 MG 24 hr capsule Take 1 capsule (240 mg total) by mouth daily. 30 capsule 5  . Ferrous Sulfate (IRON) 325 (65 FE) MG TABS Take 1 tablet by mouth daily.    . furosemide (LASIX) 40 MG tablet Take 1 tablet (40 mg total) by mouth 2 (two) times daily. 60 tablet 5  . methocarbamol (ROBAXIN) 500 MG tablet Take 250-750 mg by mouth daily as needed for muscle spasms.     . Multiple Vitamins-Minerals (MULTIVITAMIN PO) Take 1 tablet by mouth daily.    .  propranolol (INDERAL) 20 MG tablet Take 1 tablet (20 mg total) by mouth 2 (two) times daily. 60 tablet 5  . spironolactone (ALDACTONE) 25 MG tablet Take 25 mg by mouth daily.      Social History   Social History  . Marital Status: Married    Spouse Name: N/A  . Number of Children: N/A  . Years of Education: N/A   Occupational History  . [employer] instructor    Social History Main Topics  . Smoking status: Former Smoker -- 1.00 packs/day for 3 years    Types: Cigarettes    Quit date: 09/19/1974  . Smokeless tobacco: Never Used  . Alcohol Use: 0.6 oz/week    1 Standard drinks or equivalent per week  . Drug Use: No  . Sexual Activity: Not on file   Other Topics Concern  . Not on file   Social History Narrative   Lives in [place] with her husband.  Instructor for early childhood development.      Family History  Problem Relation Age of Onset  . Heart failure Mother   . Cancer Mother   . Hyperlipidemia Mother   . Lung cancer Father   . Cancer Father   . Cancer Brother   . Hyperlipidemia Brother   . Stroke Other      ROS:   General: Admits to fatigue. Denies fever, chills, change in appetite and diaphoresis.  Respiratory: Admits to SOB, DOE. Denies cough, chest tightness, and wheezing.   Cardiovascular: Admits to chest pain (resolved). Denies palpitations.  Gastrointestinal: Admits to nausea, abdominal swelling and constipation. Denies vomiting, abdominal pain, diarrhea, blood in stool.  Genitourinary: Denies dysuria, urgency, frequency. Endocrine: Admits to polydipsia. Denies hot or cold intolerance, polyuria. Musculoskeletal: Denies myalgias, back pain.  Skin: Admits to weeping blisters on lower extremities. Denies rash  Neurological: Denies dizziness, headaches, weakness, lightheadedness Psychiatric/Behavioral: Denies mood changes, confusion, nervousness  Physical Exam: Filed Vitals:   01/26/16 1430 01/26/16 1510 01/26/16 1530 01/26/16 1600  BP: 132/73 108/54  131/54 130/63  Pulse: 79 74 71 85  Temp:  97.8 F (36.6 C)    TempSrc:  Oral    Resp: 26 22 19 20   Height:      Weight:      SpO2: 97% 97% 98% 97%   General: Vital signs reviewed.  Patient is obese, in no acute distress and cooperative with exam.  Head: Normocephalic and atraumatic. Eyes: EOMI, conjunctivae normal, no scleral icterus.  Neck: Supple, trachea midline, no JVD, no carotid bruit present.  Cardiovascular: Irregularly irregular, 2/6 systolic murmur. Pulmonary/Chest: Clear to auscultation bilaterally, no wheezes, rales, or rhonchi. Abdominal: Soft, obese, +edema, non-tender, BS + Extremities: 2+ pitting edema to knees, 1+ to thighs. Pulses symmetric and intact bilaterally. No cyanosis or clubbing. Neurological: A&O x3 Skin: Warm. Weeping blisters on lower extremities. Psychiatric: Normal  mood and affect. speech and behavior is normal. Cognition and memory are normal.   Labs: Lab Results  Component Value Date   BUN 11 01/26/2016   Lab Results  Component Value Date   CREATININE 0.83 01/26/2016   Lab Results  Component Value Date   NA 135 01/26/2016   K 4.0 01/26/2016   CL 99* 01/26/2016   CO2 25 01/26/2016   Lab Results  Component Value Date   TROPONINI <0.03 01/26/2016   Lab Results  Component Value Date   WBC 5.1 01/26/2016   HGB 12.4 01/26/2016   HCT 37.4 01/26/2016   MCV 92.1 01/26/2016   PLT  01/26/2016    PLATELET CLUMPS NOTED ON SMEAR, COUNT APPEARS DECREASED   Lab Results  Component Value Date   CHOL 175 07/02/2014   HDL 59 07/02/2014   LDLCALC 97 07/02/2014   TRIG 93 07/02/2014   CHOLHDL 3.0 07/02/2014   Lab Results  Component Value Date   ALT 23 12/09/2015   AST 42* 12/09/2015   ALKPHOS 55 12/09/2015   BILITOT 2.7* 12/09/2015   Radiology:  CXR:  Cardiomegaly, pulmonary vascular congestion, no overt edema noted.  EKG:  Atrial Fibrillation, HR 103.   ASSESSMENT AND PLAN:   Ms. Rosemeyer is a 61 yo female with PMHx of atrial  fibrillation not on anticoagulation d/t ITP, T2DM, HTN who presented to [facility] on 5/9 with complaint of shortness of breath.   Suspected Acute on Chronic Diastolic Dysfunction: Patient presents with increasing shortness of breath, DOE, weight gain and lower extremity edema for the past one week. Patient weighs 375 lbs today, weight was 360 on 4/24 and on 4/27. BNP 223. CXR showed pulmonary vascular congestion but no evert edema. Echo on 3/23 revealed EF of 55-60%, mild LVH, no regional wall motion abnormalities, trivial mitral valve regurgitation, mild dilation of lett atrium, dilated IVC, trivial posterior pericardial effusion. Study was technically difficult due to body habitus, unable to determine diastolic dysfunction. No priors. Patient follows with Dr. Oval Linsey and is on labetalol 20 mg BID, diltiazem 240 mg daily, lasix 40 mg BID, spironolactone 25 mg daily and digoxin 0.375 mg daily. Patient received Lasix 80 mg IV once in the ED.  -Continue labetalol 20 mg BID -Continue diltiazem 240 mg daily -Continue spironolactone 25 mg daily -Continue digoxin 0.375 mg daily -Repeat Dig level -Start Lasix 80 mg IV TID -Strict I/Os -Daily weights -Repeat CBC/BMET tomorrow am  Atrial Fibrillation: Newly diagnosed in March 2017. Patient is not on anticoagulation d/t chronic thrombocytopenia due to ITP, platelet clumping and cirrhosis. CHADS2-VASC score of 4- sex, CHF, HTN, and T2DM. Patient appears rate controlled on labetalol 20 mg BID, diltiazem 240 mg daily, and digoxin 0.375 mg daily. Patient is not a candidate for cardioversion due to inability to perform TEE from h/o varices and inability to anticoagulate due to thrombocytopenia.  -Telemetry -Continue labetalol 20 mg BID -Continue Diltiazem 240 mg daily -Continue digoxin 0.375 mg daily  T2DM: Patient states she is pre-diabetic; however, A1c in 2015 was 6.6= diabetic and glucose is 300 on admission. I wonder if her progressive, uncontrolled diabetes is  leading to polydipsia causing fluid gains. Patient also admits to increased thirst and dry mouth which is concerning for persistent hyperglycemia. -Repeat HgbA1c -SSI-S -CM diet  HTN: Well controlled. Patient is on labetalol 20 mg BID, diltiazem 240 mg daily, lasix 40 mg BID, spironolactone 25 mg daily and digoxin 0.375 mg daily at home -Continue the above medications -Adjust lasix as  above  OSA on CPAP: Compliant with home CPAP. -Continue CPAP QHS  Liver Cirrhosis 2/2 NASH: Patient follows with gastroenterology, Dr. Paulita Fujita, as outpatient. She is on labetalol 20 mg BID, lasix 40 mg BID, spironolactone 25 mg daily and digoxin 0.375 mg daily. EGD in 2014 revealed isolated gastric varices and portal hypertensive gastropathy. No evidence of ascites on exam, but abdominal wall edema.  -Continue the above medications -Lasix 80 mg IV TID  Chronic Thrombocytopenia: D/t ITP versus cirrhosis. Patient also has clumping platelets. She follows with Hematology. In the setting of varices and thrombocytopenia, we will avoid anticoagulation. -SCDs  -No anticoagulation -Repeat CBC tomorrow am  Constipation: Patient admits to increased constipation. She denies blood in stool. -Colace daily prn   DVT/PE ppx: SCDs d/t thrombocytopenia FEN: CM/Fluid Restrict  Signed: Martyn Malay, DO PGY-2 Internal Medicine Resident Pager # [phone] 01/26/2016 5:31 PM    History and all data above reviewed.  Patient examined.  I agree with the findings as above.  The patient presents with a few weeks of increasing SOB with increasing weight and edema.  She has a history of HePEF.  She denies chest pain.  She might have some palpitations but no presyncope or syncope.    The patient exam reveals [id number]  ,  Lungs: Clear  ,  Abd: Positive bowel sounds, no rebound no guarding, Ext Severe edema.  Neck :  JVD elevated.     All available labs, radiology testing, previous records reviewed. Agree with documented assessment  and plan.  Acute on chronic diastolic HF:  The patient has increased volume with possible increased salt intake and a documented slow weight gain over a few weeks.  We will admit for IV diuresis.  She has increased fatigue.  We will check a TSH and digoxin level.  I have discussed with her salt restriction and daily weights.  I have also discussed with her the need to have her CPAP checked as she is complaining of fatigue and headaches.    Minus Breeding  6:35 PM  01/26/2016

## 2016-01-26 NOTE — ED Notes (Signed)
Ok for pt to have ice chips per Dr. Dayna Barker. Pt given ice chips.

## 2016-01-26 NOTE — Telephone Encounter (Signed)
Wt gain of 20 lbs since 12-16-15, SOB -some nausea 8-10 days getting worse- pls advise [phone]

## 2016-01-26 NOTE — ED Notes (Signed)
Called lab, will add-on troponin and BNP.  Dr. Dayna Barker at bedside.

## 2016-01-26 NOTE — ED Provider Notes (Signed)
CSN: [id number]     Arrival date & time 01/26/16  1114 History   First MD Initiated Contact with Patient 01/26/16 1153     Chief Complaint  Patient presents with  . Shortness of Breath     (Consider location/radiation/quality/duration/timing/severity/associated sxs/prior Treatment) Patient is a 60 y.o. female presenting with shortness of breath.  Shortness of Breath Severity:  Moderate Onset quality:  Gradual Duration:  2 weeks Timing:  Constant Progression:  Worsening Chronicity:  New Context: activity   Relieved by:  None tried Worsened by:  Activity and exertion Ineffective treatments:  None tried Associated symptoms: cough   Associated symptoms: no abdominal pain and no chest pain     Past Medical History  Diagnosis Date  . Fibromyalgia   . Hx of colonic polyps   . HTN (hypertension)   . ITP (idiopathic thrombocytopenic purpura)   . Sleep apnea     uses cpap setting of 14  . Liver cirrhosis secondary to NASH (nonalcoholic steatohepatitis) dx nov 2014    had enlarged spleen also   . PONV (postoperative nausea and vomiting)     in past none recent  . Diabetes mellitus without complication ([place])   . Hyperlipidemia   . Portal hypertension ([facility])    Past Surgical History  Procedure Laterality Date  . Colectomy  2011  . Knee arthroscopy  yrs ago    bilateral, one done x 1, one done twice  . Cholecystectomy  20 yrs ago  . Cesarean section  1989  . Esophagogastroduodenoscopy (egd) with propofol N/A 09/04/2013    Procedure: ESOPHAGOGASTRODUODENOSCOPY (EGD) WITH PROPOFOL;  Surgeon: [name], MD;  Location: [facility];  Service: Endoscopy;  Laterality: N/A;   Family History  Problem Relation Age of Onset  . Heart failure Mother   . Cancer Mother   . Hyperlipidemia Mother   . Lung cancer Father   . Cancer Father   . Cancer Brother   . Hyperlipidemia Brother   . Stroke Other    Social History  Substance Use Topics  . Smoking status: Former Smoker -- 1.00  packs/day for 3 years    Types: Cigarettes    Quit date: 09/19/1974  . Smokeless tobacco: Never Used  . Alcohol Use: No   OB History    No data available     Review of Systems  Constitutional: Positive for activity change and appetite change.  Respiratory: Positive for cough and shortness of breath.   Cardiovascular: Positive for palpitations and leg swelling. Negative for chest pain.  Gastrointestinal: Negative for abdominal pain.  Neurological: Positive for weakness.  All other systems reviewed and are negative.     Allergies  Celecoxib; Prednisone; Shellfish allergy; and Statins  Home Medications   Prior to Admission medications   Medication Sig Start Date End Date Taking? Authorizing Provider  acetaminophen (TYLENOL) 500 MG tablet Take 1,000 mg by mouth every 6 (six) hours as needed for moderate pain or headache.    Historical Provider, MD  Cholecalciferol (VITAMIN D) 2000 UNITS tablet Take 2,000 Units by mouth daily.    Historical Provider, MD  Coenzyme Q10 (CO Q 10) 100 MG CAPS Take 100 mg by mouth daily.    Historical Provider, MD  cyanocobalamin 1000 MCG tablet Take 1,000 mcg by mouth daily.    Historical Provider, MD  digoxin (LANOXIN) 0.25 MG tablet Take 1.5 tablets (0.375 mg total) by mouth daily. 01/11/16   Skeet Latch, MD  diltiazem (CARDIZEM CD) 240 MG 24 hr capsule  Take 1 capsule (240 mg total) by mouth daily. 12/16/15   Skeet Latch, MD  Ferrous Sulfate (IRON) 325 (65 FE) MG TABS Take 1 tablet by mouth daily.    Historical Provider, MD  furosemide (LASIX) 40 MG tablet Take 1 tablet (40 mg total) by mouth 2 (two) times daily. 01/11/16   Skeet Latch, MD  methocarbamol (ROBAXIN) 500 MG tablet Take 250-750 mg by mouth daily as needed for muscle spasms.     Historical Provider, MD  Multiple Vitamins-Minerals (MULTIVITAMIN PO) Take 1 tablet by mouth daily.    Historical Provider, MD  propranolol (INDERAL) 20 MG tablet Take 1 tablet (20 mg total) by mouth 2  (two) times daily. 01/11/16   Skeet Latch, MD  spironolactone (ALDACTONE) 25 MG tablet Take 25 mg by mouth daily.  07/24/13   Thurnell Lose, MD   BP 138/88 mmHg  Pulse 103  Temp(Src) 97.8 F (36.6 C) (Oral)  Resp 29  Ht 5' 7"  (1.702 m)  Wt 375 lb 12.8 oz (170.462 kg)  BMI 58.84 kg/m2  SpO2 96% Physical Exam  Constitutional: She appears well-developed and well-nourished.  HENT:  Head: Normocephalic and atraumatic.  Neck: Normal range of motion.  Cardiovascular: Normal rate and regular rhythm.   Pulmonary/Chest: No stridor. No respiratory distress.  Abdominal: She exhibits no distension.  Musculoskeletal: She exhibits edema (2+ pitting to LE).  Neurological: She is alert.  Nursing note and vitals reviewed.   ED Course  Procedures (including critical care time) Labs Review Labs Reviewed  CBC - Abnormal; Notable for the following:    RDW 16.6 (*)    All other components within normal limits  BASIC METABOLIC PANEL    Imaging Review No results found. I have personally reviewed and evaluated these images and lab results as part of my medical decision-making.   EKG Interpretation   Date/Time:  Tuesday Jan 26 2016 11:21:23 EDT Ventricular Rate:  103 PR Interval:    QRS Duration: 88 QT Interval:  328 QTC Calculation: 429 R Axis:   46 Text Interpretation:  Atrial fibrillation with rapid ventricular response  Low voltage QRS Nonspecific T wave abnormality Abnormal ECG Confirmed by  Moneka Mcquinn MD, Corene Cornea [phone]) on 01/26/2016 11:49:30 AM      MDM   Final diagnoses:  Acute on chronic diastolic CHF (congestive heart failure), NYHA class 2 (HCC)    Likely CHF exacerbation exaerbated by her obesity and likely hypoventilation. Will give lasix in ED and d/w cardiology regarding admission for further diuresis.     Merrily Pew, MD 01/27/16 [phone]

## 2016-01-26 NOTE — Telephone Encounter (Signed)
Please ask her to go to the ED.

## 2016-01-26 NOTE — Progress Notes (Signed)
PHARMACIST - PHYSICIAN ORDER COMMUNICATION  CONCERNING: P&T Medication Policy on Herbal Medications  DESCRIPTION:  This patient's order for:  Co-Q 10  has been noted.  This product(s) is classified as an "herbal" or natural product. Due to a lack of definitive safety studies or FDA approval, nonstandard manufacturing practices, plus the potential risk of unknown drug-drug interactions while on inpatient medications, the Pharmacy and Therapeutics Committee does not permit the use of "herbal" or natural products of this type within [facility].   ACTION TAKEN: The pharmacy department is unable to verify this order at this time and your patient has been informed of this safety policy. Please reevaluate patient's clinical condition at discharge and address if the herbal or natural product(s) should be resumed at that time.   Gracy Bruins, PharmD Clinical Pharmacist [facility]

## 2016-01-26 NOTE — ED Notes (Signed)
Pt c/o SOB since yesterday, hx of aflutter, possibly CHF, pt has gained 14 pounds in the last few weeks, pt very SOB with exertion, states shes been taking her lasix and shes retaining a lot of fluid. Pt walked 10 feet and could barely catch her breath. Pt in afib at this time. Pt not on blood thinners due to low platelets. Denies pain.

## 2016-01-26 NOTE — Telephone Encounter (Signed)
Returned call. Pt c/o numerous symptoms. States 14 lb weight gain since seen by Dr. Oval Linsey in April, and total of 20 lbs since March. Ankles hurt, legs swollen and blistered. Pt SOB w ambulating ~15 ft  States she also feels BPs running higher since propranolol dose changed.  Reporting fatigue, "absolutely out of breath" with ambulation, this SOB is progressively worse. Drinking fluids but "constantly thirsty" and constipated.  Pt requests advice. Informed pt I would inquire to Dr. Oval Linsey for recommendations.

## 2016-01-27 ENCOUNTER — Encounter (HOSPITAL_COMMUNITY): Payer: Self-pay | Admitting: General Practice

## 2016-01-27 LAB — HEMOGLOBIN A1C
Hgb A1c MFr Bld: 7.5 % — ABNORMAL HIGH (ref 4.8–5.6)
Mean Plasma Glucose: 169 mg/dL

## 2016-01-27 LAB — CBC
HCT: 34 % — ABNORMAL LOW (ref 36.0–46.0)
HEMOGLOBIN: 11.2 g/dL — AB (ref 12.0–15.0)
MCH: 30.1 pg (ref 26.0–34.0)
MCHC: 32.9 g/dL (ref 30.0–36.0)
MCV: 91.4 fL (ref 78.0–100.0)
Platelets: 34 10*3/uL — ABNORMAL LOW (ref 150–400)
RBC: 3.72 MIL/uL — ABNORMAL LOW (ref 3.87–5.11)
RDW: 16.6 % — AB (ref 11.5–15.5)
WBC: 4.6 10*3/uL (ref 4.0–10.5)

## 2016-01-27 LAB — COMPREHENSIVE METABOLIC PANEL
ALBUMIN: 2.6 g/dL — AB (ref 3.5–5.0)
ALK PHOS: 84 U/L (ref 38–126)
ALT: 27 U/L (ref 14–54)
ANION GAP: 9 (ref 5–15)
AST: 45 U/L — AB (ref 15–41)
BILIRUBIN TOTAL: 3 mg/dL — AB (ref 0.3–1.2)
BUN: 11 mg/dL (ref 6–20)
CALCIUM: 8.2 mg/dL — AB (ref 8.9–10.3)
CO2: 28 mmol/L (ref 22–32)
CREATININE: 0.83 mg/dL (ref 0.44–1.00)
Chloride: 102 mmol/L (ref 101–111)
GFR calc Af Amer: >60 mL/min (ref >60)
GFR calc non Af Amer: >60 mL/min (ref >60)
GLUCOSE: 233 mg/dL — AB (ref 65–99)
Potassium: 3.3 mmol/L — ABNORMAL LOW (ref 3.5–5.1)
SODIUM: 139 mmol/L (ref 135–145)
TOTAL PROTEIN: 6 g/dL — AB (ref 6.5–8.1)

## 2016-01-27 LAB — GLUCOSE, CAPILLARY
GLUCOSE-CAPILLARY: 219 mg/dL — AB (ref 65–99)
Glucose-Capillary: 276 mg/dL — ABNORMAL HIGH (ref 65–99)
Glucose-Capillary: 210 mg/dL — ABNORMAL HIGH (ref 65–99)
Glucose-Capillary: 218 mg/dL — ABNORMAL HIGH (ref 65–99)

## 2016-01-27 LAB — TSH: TSH: 2.932 u[IU]/mL (ref 0.350–4.500)

## 2016-01-27 MED ORDER — LIVING WELL WITH DIABETES BOOK
Freq: Once | Status: AC
  Administered 2016-01-27: 09:00:00
  Filled 2016-01-27: qty 1

## 2016-01-27 MED ORDER — POTASSIUM CHLORIDE CRYS ER 20 MEQ PO TBCR
40.0000 meq | EXTENDED_RELEASE_TABLET | Freq: Every day | ORAL | Status: DC
  Administered 2016-01-27 – 2016-01-30 (×4): 40 meq via ORAL
  Filled 2016-01-27 (×5): qty 2

## 2016-01-27 MED ORDER — FUROSEMIDE 10 MG/ML IJ SOLN
80.0000 mg | Freq: Once | INTRAMUSCULAR | Status: AC
  Administered 2016-01-27: 80 mg via INTRAVENOUS

## 2016-01-27 NOTE — Progress Notes (Addendum)
Inpatient Diabetes Program Recommendations  AACE/ADA: New Consensus Statement on Inpatient Glycemic Control (2015)  Target Ranges:  Prepandial:   less than 140 mg/dL      Peak postprandial:   less than 180 mg/dL (1-2 hours)      Critically ill patients:  140 - 180 mg/dL   Spoke with patient about diabetes and home regimen for diabetes control. Patient reports she had diabetes in the past and was on Metformin. She was able to come off of it after she was diagnosed with Fibromyalgia and was exercising 4 hours a day and lost 85 pounds. Patient had stopped drinking soda and eating fast food for 1 tear. Patient had a series of health events with Thrombocytopenia and a colon resection that did not health that altered her physical activity. Patient gained her weight back. Patient's husband has DM and is well controlled with A1c in the 6% range. Discussed with patient A1c level of 7.5%. Patient has taught nutrition in the past and is familiar with alterations in diet for DM management. Spoke with patient about cardiology clearance for Exercise. Spoke with patient about the ADA's recommendations of 30 minutes 5 days/week. Patient does not feel her PCP will manage her well. Spoke to patient about lifestyle changes currently and then asking her PCP to refer to to a DM specialist or going to see her husbands Doctor, Dr. Buddy Duty for DM management. Spoke with patient about checking her glucose trends twice a day. Patient will need order for glucose meter kit at discharge (order # [id number]). Discussed importance of checking CBGs and maintaining good CBG control to prevent long-term and short-term complications. Briefly discussed carbohydrates, carbohydrate goals per day and meal, along with portion sizes.  Patient and husband verbalized understanding of information discussed and they have no further questions at this time related to diabetes.  Thanks, Tama Headings RN, MSN, [facility] Inpatient Diabetes Coordinator Team Pager  [phone] (8a-5p)

## 2016-01-27 NOTE — Progress Notes (Signed)
Pt stated she wants to put CPAP on herself when she is ready for bed. RT checked the machine setup and water level. Instructed patient to call if she had any questions or needed help. Pt has been placing CPAP on and off herself for naps during the day. Will cont to monitor

## 2016-01-27 NOTE — Progress Notes (Signed)
SUBJECTIVE:  No pain.  No SOB.     PHYSICAL EXAM Filed Vitals:   01/26/16 1815 01/26/16 2007 01/26/16 2220 01/27/16 0530  BP: 141/57 132/49  113/45  Pulse: 87 91 90 94  Temp:  98.2 F (36.8 C)  98.3 F (36.8 C)  TempSrc:  Oral  Oral  Resp: 16 18 17 18   Height:  5' 7"  (1.702 m)    Weight:  374 lb 6.4 oz (169.827 kg)  373 lb 4.8 oz (169.328 kg)  SpO2: 97% 97% 97% 98%   General:  No distress Lungs:  Clear Heart:  RRR Abdomen:  Positive bowel sounds, no rebound no guarding Extremities:   Moderate edema   LABS: Lab Results  Component Value Date   TROPONINI <0.03 01/26/2016   Results for orders placed or performed during the hospital encounter of 01/26/16 (from the past 24 hour(s))  Basic metabolic panel     Status: Abnormal   Collection Time: 01/26/16 11:28 AM  Result Value Ref Range   Sodium 135 135 - 145 mmol/L   Potassium 4.0 3.5 - 5.1 mmol/L   Chloride 99 (L) 101 - 111 mmol/L   CO2 25 22 - 32 mmol/L   Glucose, Bld 302 (H) 65 - 99 mg/dL   BUN 11 6 - 20 mg/dL   Creatinine, Ser 0.83 0.44 - 1.00 mg/dL   Calcium 8.6 (L) 8.9 - 10.3 mg/dL   GFR calc non Af Amer >60 >60 mL/min   GFR calc Af Amer >60 >60 mL/min   Anion gap 11 5 - 15  CBC     Status: Abnormal   Collection Time: 01/26/16 11:28 AM  Result Value Ref Range   WBC 5.1 4.0 - 10.5 K/uL   RBC 4.06 3.87 - 5.11 MIL/uL   Hemoglobin 12.4 12.0 - 15.0 g/dL   HCT 37.4 36.0 - 46.0 %   MCV 92.1 78.0 - 100.0 fL   MCH 30.5 26.0 - 34.0 pg   MCHC 33.2 30.0 - 36.0 g/dL   RDW 16.6 (H) 11.5 - 15.5 %   Platelets  150 - 400 K/uL    PLATELET CLUMPS NOTED ON SMEAR, COUNT APPEARS DECREASED  Brain natriuretic peptide     Status: Abnormal   Collection Time: 01/26/16 11:28 AM  Result Value Ref Range   B Natriuretic Peptide 223.1 (H) 0.0 - 100.0 pg/mL  Troponin I     Status: None   Collection Time: 01/26/16 11:28 AM  Result Value Ref Range   Troponin I <0.03 <0.031 ng/mL  Hemoglobin A1c     Status: Abnormal   Collection  Time: 01/26/16  6:55 PM  Result Value Ref Range   Hgb A1c MFr Bld 7.5 (H) 4.8 - 5.6 %   Mean Plasma Glucose 169 mg/dL  Digoxin level     Status: None   Collection Time: 01/26/16  6:55 PM  Result Value Ref Range   Digoxin Level 1.1 0.8 - 2.0 ng/mL  TSH     Status: None   Collection Time: 01/27/16  3:24 AM  Result Value Ref Range   TSH 2.932 0.350 - 4.500 uIU/mL  Comprehensive metabolic panel     Status: Abnormal   Collection Time: 01/27/16  3:24 AM  Result Value Ref Range   Sodium 139 135 - 145 mmol/L   Potassium 3.3 (L) 3.5 - 5.1 mmol/L   Chloride 102 101 - 111 mmol/L   CO2 28 22 - 32 mmol/L   Glucose, Bld 233 (H)  65 - 99 mg/dL   BUN 11 6 - 20 mg/dL   Creatinine, Ser 0.83 0.44 - 1.00 mg/dL   Calcium 8.2 (L) 8.9 - 10.3 mg/dL   Total Protein 6.0 (L) 6.5 - 8.1 g/dL   Albumin 2.6 (L) 3.5 - 5.0 g/dL   AST 45 (H) 15 - 41 U/L   ALT 27 14 - 54 U/L   Alkaline Phosphatase 84 38 - 126 U/L   Total Bilirubin 3.0 (H) 0.3 - 1.2 mg/dL   GFR calc non Af Amer >60 >60 mL/min   GFR calc Af Amer >60 >60 mL/min   Anion gap 9 5 - 15  CBC     Status: Abnormal   Collection Time: 01/27/16  3:24 AM  Result Value Ref Range   WBC 4.6 4.0 - 10.5 K/uL   RBC 3.72 (L) 3.87 - 5.11 MIL/uL   Hemoglobin 11.2 (L) 12.0 - 15.0 g/dL   HCT 34.0 (L) 36.0 - 46.0 %   MCV 91.4 78.0 - 100.0 fL   MCH 30.1 26.0 - 34.0 pg   MCHC 32.9 30.0 - 36.0 g/dL   RDW 16.6 (H) 11.5 - 15.5 %   Platelets 34 (L) 150 - 400 K/uL  Glucose, capillary     Status: Abnormal   Collection Time: 01/27/16  6:53 AM  Result Value Ref Range   Glucose-Capillary 219 (H) 65 - 99 mg/dL    Intake/Output Summary (Last 24 hours) at 01/27/16 0930 Last data filed at 01/27/16 0500  Gross per 24 hour  Intake      0 ml  Output   2100 ml  Net  -2100 ml      ASSESSMENT AND PLAN:  ACUTE ON CHRONIC DIASTOLIC HF:  Continue current diuresis.  Supplement potassium.    SLEEP APNEA:  Continue home CPAP.   DM:    New diagnosis given and education.   Trying diet control at first.   Minus Breeding 01/27/2016 9:30 AM

## 2016-01-27 NOTE — Progress Notes (Signed)
Inpatient Diabetes Program Recommendations  AACE/ADA: New Consensus Statement on Inpatient Glycemic Control (2015)  Target Ranges:  Prepandial:   less than 140 mg/dL      Peak postprandial:   less than 180 mg/dL (1-2 hours)      Critically ill patients:  140 - 180 mg/dL   Review of Glycemic Control  Diabetes history: PreDM Current orders for Inpatient glycemic control: Novolog Sensitive TID  Inpatient Diabetes Program Recommendations:  A1c 7.5%, per ADA meets criteria for DM diagnosis. Have ordered the Living Well with DM booklet in addition to the DM educational videos while patient is here. Patient will need to learn how to check her glucose levels with meter while here. Due to history of cirrhosis DM oral medications may not be an option. She may have to be on insulin if she can't control glucose levels through diet and exercise. She will need close f/u with PCP.  Thanks,  Tama Headings RN, MSN, [facility] Inpatient Diabetes Coordinator Team Pager [phone] (8a-5p)

## 2016-01-28 LAB — BASIC METABOLIC PANEL
Anion gap: 11 (ref 5–15)
BUN: 8 mg/dL (ref 6–20)
CALCIUM: 8.8 mg/dL — AB (ref 8.9–10.3)
CO2: 27 mmol/L (ref 22–32)
CREATININE: 0.98 mg/dL (ref 0.44–1.00)
Chloride: 98 mmol/L — ABNORMAL LOW (ref 101–111)
GFR calc non Af Amer: >60 mL/min (ref >60)
Glucose, Bld: 285 mg/dL — ABNORMAL HIGH (ref 65–99)
Potassium: 3.6 mmol/L (ref 3.5–5.1)
SODIUM: 136 mmol/L (ref 135–145)

## 2016-01-28 LAB — GLUCOSE, CAPILLARY
GLUCOSE-CAPILLARY: 199 mg/dL — AB (ref 65–99)
Glucose-Capillary: 172 mg/dL — ABNORMAL HIGH (ref 65–99)
Glucose-Capillary: 247 mg/dL — ABNORMAL HIGH (ref 65–99)
Glucose-Capillary: 181 mg/dL — ABNORMAL HIGH (ref 65–99)

## 2016-01-28 NOTE — Progress Notes (Signed)
Inpatient Diabetes Program Recommendations  AACE/ADA: New Consensus Statement on Inpatient Glycemic Control (2015)  Target Ranges:  Prepandial:   less than 140 mg/dL      Peak postprandial:   less than 180 mg/dL (1-2 hours)      Critically ill patients:  140 - 180 mg/dL  Results for NABILAH, DAVOLI (MRN [id number]) as of 01/28/2016 11:23  Ref. Range 12/12/2015 11:44 01/27/2016 06:53 01/27/2016 11:19 01/27/2016 16:52 01/27/2016 21:41 01/28/2016 06:45 01/28/2016 11:29  Glucose-Capillary Latest Ref Range: 65-99 mg/dL 134 (H) 219 (H) 276 (H) 210 (H) 218 (H) 172 (H) 247 (H)   Review of Glycemic Control   Current orders for Inpatient glycemic control: Novolog 0-9 units TID with meals, Novolog 0-5 units QHS  Inpatient Diabetes Program Recommendations: Correction (SSI): Please consider ordeirng Novolog bedtime correction scale. Oral Agents: May want to consider ordering Glipizide 2.5 mg daily.  Thanks, Barnie Alderman, RN, MSN, CDE Diabetes Coordinator Inpatient Diabetes Program [phone] (Team Pager from [place] to [place]) [phone] ([place] office) [phone] [facility] office) [phone] [facility] office)

## 2016-01-28 NOTE — Progress Notes (Signed)
    SUBJECTIVE:  No pain.  No SOB.  Unhappy with the care on the unit.  However, no acute medical complaints.    PHYSICAL EXAM Filed Vitals:   01/27/16 2151 01/28/16 0137 01/28/16 0600 01/28/16 0846  BP: 124/49 129/54  136/56  Pulse: 93 92    Temp: 98.1 F (36.7 C) 97.9 F (36.6 C)    TempSrc: Oral Oral    Resp: 18 20    Height:      Weight:   366 lb 3.2 oz (166.107 kg)   SpO2: 99% 95%     General:  No distress Lungs:  Clear Heart:  RRR Abdomen:  Positive bowel sounds, no rebound no guarding Extremities:   Moderate edema   LABS: Lab Results  Component Value Date   TROPONINI <0.03 01/26/2016   Results for orders placed or performed during the hospital encounter of 01/26/16 (from the past 24 hour(s))  Glucose, capillary     Status: Abnormal   Collection Time: 01/27/16  4:52 PM  Result Value Ref Range   Glucose-Capillary 210 (H) 65 - 99 mg/dL   Comment 1 Notify RN    Comment 2 Document in Chart   Glucose, capillary     Status: Abnormal   Collection Time: 01/27/16  9:41 PM  Result Value Ref Range   Glucose-Capillary 218 (H) 65 - 99 mg/dL   Comment 1 Notify RN    Comment 2 Document in Chart   Glucose, capillary     Status: Abnormal   Collection Time: 01/28/16  6:45 AM  Result Value Ref Range   Glucose-Capillary 172 (H) 65 - 99 mg/dL   Comment 1 Notify RN    Comment 2 Document in Chart   Glucose, capillary     Status: Abnormal   Collection Time: 01/28/16 11:29 AM  Result Value Ref Range   Glucose-Capillary 247 (H) 65 - 99 mg/dL    Intake/Output Summary (Last 24 hours) at 01/28/16 1146 Last data filed at 01/28/16 1624  Gross per 24 hour  Intake    720 ml  Output   6101 ml  Net  -5381 ml      ASSESSMENT AND PLAN:  ACUTE ON CHRONIC DIASTOLIC HF:  Continue current diuresis.  BMET pending.   SLEEP APNEA:  Continue home CPAP.   DM:    New diagnosis given and education.  Trying diet control at first.   Minus Breeding 01/28/2016 11:46 AM

## 2016-01-29 LAB — GLUCOSE, CAPILLARY
GLUCOSE-CAPILLARY: 181 mg/dL — AB (ref 65–99)
Glucose-Capillary: 145 mg/dL — ABNORMAL HIGH (ref 65–99)
Glucose-Capillary: 158 mg/dL — ABNORMAL HIGH (ref 65–99)
Glucose-Capillary: 264 mg/dL — ABNORMAL HIGH (ref 65–99)
Glucose-Capillary: 173 mg/dL — ABNORMAL HIGH (ref 65–99)

## 2016-01-29 LAB — BASIC METABOLIC PANEL
Anion gap: 12 (ref 5–15)
BUN: 9 mg/dL (ref 6–20)
CALCIUM: 8.6 mg/dL — AB (ref 8.9–10.3)
CO2: 28 mmol/L (ref 22–32)
CREATININE: 0.78 mg/dL (ref 0.44–1.00)
Chloride: 97 mmol/L — ABNORMAL LOW (ref 101–111)
GFR calc Af Amer: >60 mL/min (ref >60)
GLUCOSE: 162 mg/dL — AB (ref 65–99)
POTASSIUM: 3.1 mmol/L — AB (ref 3.5–5.1)
SODIUM: 137 mmol/L (ref 135–145)

## 2016-01-29 MED ORDER — POTASSIUM CHLORIDE CRYS ER 20 MEQ PO TBCR
40.0000 meq | EXTENDED_RELEASE_TABLET | Freq: Once | ORAL | Status: AC
  Administered 2016-01-29: 40 meq via ORAL
  Filled 2016-01-29: qty 2

## 2016-01-29 NOTE — Progress Notes (Signed)
Pt ambulated 150 feet in hall on room air with no assistance. Pt tolerated fair. Said for the past month she has tired easily and that is the longest continuous distance she has walked this month. Educated patient on need to take breaks, walk at a slower pace when needed, and on daily weights. Pt returned to bed, call light within reach, will continue to monitor.   Fritz Pickerel, RN

## 2016-01-29 NOTE — Progress Notes (Signed)
SUBJECTIVE:  No pain.  No SOB.  She had a strange sense of "floating" last night.  She was not describing ain or dyspnea.  No arrhythmia on monitor.  Otherwise OK.    PHYSICAL EXAM Filed Vitals:   01/28/16 1449 01/28/16 2019 01/29/16 0415 01/29/16 0601  BP: 121/63 123/47 126/58   Pulse:  81 88   Temp: 98.6 F (37 C) 98 F (36.7 C) 97.8 F (36.6 C)   TempSrc: Oral Oral Oral   Resp: 18 18 18    Height:      Weight:    360 lb 14.4 oz (163.703 kg)  SpO2: 98% 99% 97%    General:  No distress Lungs:  Clear Heart:  RRR, soft systolic murmur.  Abdomen:  Positive bowel sounds, no rebound no guarding Extremities:   Moderate edema improved  LABS:  Results for orders placed or performed during the hospital encounter of 01/26/16 (from the past 24 hour(s))  Glucose, capillary     Status: Abnormal   Collection Time: 01/28/16 11:29 AM  Result Value Ref Range   Glucose-Capillary 247 (H) 65 - 99 mg/dL  Basic metabolic panel     Status: Abnormal   Collection Time: 01/28/16 12:29 PM  Result Value Ref Range   Sodium 136 135 - 145 mmol/L   Potassium 3.6 3.5 - 5.1 mmol/L   Chloride 98 (L) 101 - 111 mmol/L   CO2 27 22 - 32 mmol/L   Glucose, Bld 285 (H) 65 - 99 mg/dL   BUN 8 6 - 20 mg/dL   Creatinine, Ser 0.98 0.44 - 1.00 mg/dL   Calcium 8.8 (L) 8.9 - 10.3 mg/dL   GFR calc non Af Amer >60 >60 mL/min   GFR calc Af Amer >60 >60 mL/min   Anion gap 11 5 - 15  Glucose, capillary     Status: Abnormal   Collection Time: 01/28/16  5:05 PM  Result Value Ref Range   Glucose-Capillary 181 (H) 65 - 99 mg/dL  Glucose, capillary     Status: Abnormal   Collection Time: 01/28/16  9:21 PM  Result Value Ref Range   Glucose-Capillary 199 (H) 65 - 99 mg/dL  Glucose, capillary     Status: Abnormal   Collection Time: 01/29/16  4:23 AM  Result Value Ref Range   Glucose-Capillary 145 (H) 65 - 99 mg/dL  Basic metabolic panel     Status: Abnormal   Collection Time: 01/29/16  4:31 AM  Result Value Ref  Range   Sodium 137 135 - 145 mmol/L   Potassium 3.1 (L) 3.5 - 5.1 mmol/L   Chloride 97 (L) 101 - 111 mmol/L   CO2 28 22 - 32 mmol/L   Glucose, Bld 162 (H) 65 - 99 mg/dL   BUN 9 6 - 20 mg/dL   Creatinine, Ser 0.78 0.44 - 1.00 mg/dL   Calcium 8.6 (L) 8.9 - 10.3 mg/dL   GFR calc non Af Amer >60 >60 mL/min   GFR calc Af Amer >60 >60 mL/min   Anion gap 12 5 - 15  Glucose, capillary     Status: Abnormal   Collection Time: 01/29/16  5:57 AM  Result Value Ref Range   Glucose-Capillary 158 (H) 65 - 99 mg/dL    Intake/Output Summary (Last 24 hours) at 01/29/16 0700 Last data filed at 01/29/16 0601  Gross per 24 hour  Intake    720 ml  Output   3800 ml  Net  -3080 ml  ASSESSMENT AND PLAN:  ACUTE ON CHRONIC DIASTOLIC HF:  Continue current diuresis.  Potassium low this morning.  Supplement.  Switch to PO diuretic in the AM and discharge.   SLEEP APNEA:  Continue home CPAP.   DM:    New diagnosis given and education.  Trying diet control at first.   ATRIAL FIB:  Not on anticoagulation secondary to liver disease.  Continue current medications.   Dig level OK.   Jeneen Rinks Merilyn Pagan 01/29/2016 7:00 AM

## 2016-01-29 NOTE — Progress Notes (Signed)
Patient reports waking up with anxious feeling this morning. Stated she feel jittery and nervous. All vitals stable and CBG is 145. Cardiac monitor shows a-fib with heart rate of 88.Gave patient emotional support and advised her to call me if further needs.

## 2016-01-30 DIAGNOSIS — I481 Persistent atrial fibrillation: Secondary | ICD-10-CM

## 2016-01-30 DIAGNOSIS — E876 Hypokalemia: Secondary | ICD-10-CM

## 2016-01-30 DIAGNOSIS — E119 Type 2 diabetes mellitus without complications: Secondary | ICD-10-CM

## 2016-01-30 DIAGNOSIS — I4821 Permanent atrial fibrillation: Secondary | ICD-10-CM

## 2016-01-30 LAB — BASIC METABOLIC PANEL
Anion gap: 13 (ref 5–15)
BUN: 9 mg/dL (ref 6–20)
CHLORIDE: 97 mmol/L — AB (ref 101–111)
CO2: 27 mmol/L (ref 22–32)
Calcium: 8.6 mg/dL — ABNORMAL LOW (ref 8.9–10.3)
Creatinine, Ser: 0.79 mg/dL (ref 0.44–1.00)
GFR calc Af Amer: >60 mL/min (ref >60)
GFR calc non Af Amer: >60 mL/min (ref >60)
Glucose, Bld: 165 mg/dL — ABNORMAL HIGH (ref 65–99)
POTASSIUM: 3.3 mmol/L — AB (ref 3.5–5.1)
Sodium: 137 mmol/L (ref 135–145)

## 2016-01-30 LAB — GLUCOSE, CAPILLARY
GLUCOSE-CAPILLARY: 164 mg/dL — AB (ref 65–99)
GLUCOSE-CAPILLARY: 215 mg/dL — AB (ref 65–99)

## 2016-01-30 MED ORDER — POTASSIUM CHLORIDE ER 20 MEQ PO TBCR: 40.0000 meq | EXTENDED_RELEASE_TABLET | Freq: Two times a day (BID) | ORAL | Status: DC

## 2016-01-30 MED ORDER — POTASSIUM CHLORIDE ER 20 MEQ PO TBCR: 20.0000 meq | EXTENDED_RELEASE_TABLET | Freq: Two times a day (BID) | ORAL | Status: DC

## 2016-01-30 MED ORDER — FUROSEMIDE 40 MG PO TABS: 80.0000 mg | ORAL_TABLET | Freq: Two times a day (BID) | ORAL | Status: DC

## 2016-01-30 MED ORDER — POTASSIUM CHLORIDE CRYS ER 20 MEQ PO TBCR
40.0000 meq | EXTENDED_RELEASE_TABLET | ORAL | Status: AC
  Administered 2016-01-30 (×2): 40 meq via ORAL
  Filled 2016-01-30 (×2): qty 2

## 2016-01-30 NOTE — Progress Notes (Signed)
Patient Name: Hannah Khan      SUBJECTIVE: Admitted 01/26/16 with shortness of breath ascribed to HFpEF  He is better although not back to her baseline. She has not been back to her baseline however since developing atrial flutter 3/17  She has atrial fibrillation sleep apnea hypertension in the context of morbid obesity. (350 pounds). She's been treated in hospital with IV diuresis  Her atrial fibrillation is persistent. It was first identified presenting as atrial flutter 3/21. She has a complicated history involving ITP NASH and associated cirrhosis which is felt to preclude anticoagulation  She has been newly diagnosed with diabetes (hemoglobin A1c 7.5) and was seen by the diabetes coordinator. Glipizide was recommended   Past Medical History  Diagnosis Date  . Fibromyalgia   . Hx of colonic polyps     s/p partial colectomy  . HTN (hypertension)   . ITP (idiopathic thrombocytopenic purpura)   . Sleep apnea     uses cpap setting of 14  . Liver cirrhosis secondary to NASH (nonalcoholic steatohepatitis) dx nov 2014    had enlarged spleen also   . PONV (postoperative nausea and vomiting)     in past none recent  . Diabetes mellitus without complication ([place])   . Hyperlipidemia   . Portal hypertension ([place])   . Atrial fibrillation ([place])   . Morbid obesity ([place])     Scheduled Meds:  Scheduled Meds: . cholecalciferol  2,000 Units Oral Daily  . digoxin  0.375 mg Oral Daily  . diltiazem  240 mg Oral Daily  . furosemide  80 mg Intravenous TID  . insulin aspart  0-9 Units Subcutaneous TID WC  . potassium chloride  40 mEq Oral Daily  . propranolol  20 mg Oral BID  . sodium chloride flush  3 mL Intravenous Q12H  . spironolactone  25 mg Oral Daily  . cyanocobalamin  1,000 mcg Oral Daily   Continuous Infusions:  methocarbamol    PHYSICAL EXAM Filed Vitals:   01/29/16 1435 01/29/16 2134 01/30/16 0618 01/30/16 0947  BP: 129/58 100/49 121/52 140/51    Pulse: 84 95 85 87  Temp: 98.5 F (36.9 C) 98.3 F (36.8 C) 98.1 F (36.7 C)   TempSrc: Oral Oral Oral   Resp: 18 20 18    Height:      Weight:   358 lb 7.5 oz (162.6 kg)   SpO2: 96% 98% 97%     Well developed and nourished in no acute distress Morbidly obese HENT normal Neck supple with JVP-flat Carotids brisk and full without bruits Clear Irregularly irregular rate and rhythm with controlled ventricular response, no murmurs or gallops Abd-soft with active BS without hepatomegaly No Clubbing cyanosis 1+ edema Skin-warm and dry A & Oriented  Grossly normal sensory and motor function   TELEMETRY: Reviewed telemetry pt in arib controlled VR    Intake/Output Summary (Last 24 hours) at 01/30/16 1011 Last data filed at 01/30/16 0852  Gross per 24 hour  Intake    462 ml  Output   4825 ml  Net  -4363 ml    LABS: Basic Metabolic Panel:  Recent Labs Lab 01/26/16 1128 01/27/16 0324 01/28/16 1229 01/29/16 0431 01/30/16 0429  NA 135 139 136 137 137  K 4.0 3.3* 3.6 3.1* 3.3*  CL 99* 102 98* 97* 97*  CO2 25 28 27 28 27   GLUCOSE 302* 233* 285* 162* 165*  BUN 11 11 8 9 9   CREATININE 0.83  0.83 0.98 0.78 0.79  CALCIUM 8.6* 8.2* 8.8* 8.6* 8.6*   Cardiac Enzymes: No results for input(s): CKTOTAL, CKMB, CKMBINDEX, TROPONINI in the last 72 hours. CBC:  Recent Labs Lab 01/26/16 1128 01/27/16 0324  WBC 5.1 4.6  HGB 12.4 11.2*  HCT 37.4 34.0*  MCV 92.1 91.4  PLT PLATELET CLUMPS NOTED ON SMEAR, COUNT APPEARS DECREASED 34*   PROTIME: No results for input(s): LABPROT, INR in the last 72 hours. Liver Function Tests: No results for input(s): AST, ALT, ALKPHOS, BILITOT, PROT, ALBUMIN in the last 72 hours. No results for input(s): LIPASE, AMYLASE in the last 72 hours. BNP: BNP (last 3 results)  Recent Labs  01/26/16 1128  BNP 223.1*    ProBNP (last 3 results) No results for input(s): PROBNP in the last 8760 hours.  D-Dimer: No results for input(s): DDIMER in  the last 72 hours. Hemoglobin A1C: No results for input(s): HGBA1C in the last 72 hours. Fasting Lipid Panel: No results for input(s): CHOL, HDL, LDLCALC, TRIG, CHOLHDL, LDLDIRECT in the last 72 hours. Thyroid Function Tests: No results for input(s): TSH, T4TOTAL, T3FREE, THYROIDAB in the last 72 hours.  Invalid input(s): FREET3 Anemia Panel: No results for input(s): VITAMINB12, FOLATE, FERRITIN, TIBC, IRON, RETICCTPCT in the last 72 hours.      ASSESSMENT AND PLAN:  Active Problems:   Acute on chronic diastolic CHF (congestive heart failure), NYHA class 2 (HCC)   Atrial fibrillation (HCC)   Diabetes mellitus, type 2 (HCC)   Hypokalemia  The patient has a very complicated problem with the persistence of her atrial fibrillation in the context of her HFpEF likely the major interactions. Her hematological issues and GI issues have thus far precluded anticoagulation; from an arrhythmia point of view there is nothing that can be done until this issue is addressed.  The question that I would have for her physicians is whether she could take a four-week exposure to a NOAC and pursue TEE guided dofetilide facilitated cardioversion the latter to help maintain sinus rhythm going forward as the anticoagulation will not be an option long-term.  I will defer this to her primary physician's; this being the case, we will cancel her appointment with [facility] on Tuesday until the issue of anticoagulation is resolved  As relates to her heart failure, we'll send her home on furosemide 80 by mouth once or twice daily as needed. She will need augmented potassium repletion 40 twice a day  She will need early follow-up with cardiology for electrolyte management  She'll need early follow-up with her PCP to make a decision about therapy for her diabetes in the context of her multiple comorbidities  Her dig level as high particularly in the context of hypokalemia. I will discontinue it  Signed, Virl Axe  MD  01/30/2016

## 2016-01-30 NOTE — Progress Notes (Signed)
Patient and Significant other given discharge instructions medication list and prescriptions sent to personal pharmacy. Follow up appointments given also. All questions were answered will discharge home as ordered. Mykenna Viele, Bettina Gavia RN

## 2016-01-30 NOTE — Discharge Summary (Signed)
Discharge Summary    Patient ID: Hannah Khan,  MRN: [id number], DOB/AGE: 1956/04/18 60 y.o.  Admit date: 01/26/2016 Discharge date: 01/30/2016  Primary Care Provider: Lujean Amel Primary Cardiologist: Dr. Oval Linsey  Discharge Diagnoses    Active Problems:   Acute on chronic diastolic CHF (congestive heart failure), NYHA class 2 (HCC)   Atrial fibrillation (HCC)   Diabetes mellitus, type 2 (HCC)   Hypokalemia   Allergies Allergies  Allergen Reactions  . Celecoxib Other (See Comments)    "speech slurred" with celebrex  . Prednisone Other (See Comments)    Rapid heart rate  . Shellfish Allergy Swelling    Lips tingle and swell. Whelps the size of quarters.   . Statins Other (See Comments)    aggravated fibromyalgia    Diagnostic Studies/Procedures  None _____________   History of Present Illness   Hannah Khan is a 60 year old female with a past medical history of atrial fibrillation (not on anticoagulation d/t idiopathic thrombocytopenia, diabetes, hypertension, obstructive sleep apnea on CPAP, liver cirrhosis 2/2 NASH, tubular adenoma status post partial colectomy, and morbid obesity. She presented to [facility] emergency department on 01/26/2016 with complaints of shortness of breath. For the past week she developed progressive dyspnea on exertion, bilateral lower extremity edema, and abdominal swelling with weeping blisters, weakness and fatigue. She did have one episode of brief chest pain that resolved on its own. She had a 14 pound weight gain since 01/14/2016. Her baseline weight is 350 pounds.  Hospital Course  In the emergency department she was in A. fib, with a rate of 103. This is not new, however she was recently diagnosed in March 2017. She is not candidate for anticoagulation due to chronic thrombocytopenia and also not a candidate for cardioversion due to inability to perform TEE with history of varices. She was admitted for IV diuresis for CHF  exacerbation.  She was diuresed with IV Lasix. Her weight is down 17 pounds since admission. Her last echo was in March 2017 showed an EF of 55-60%, study was not technically sufficient to allow for evaluation of LV diastolic function. She did have some mild LVH.  She received the diagnosis of diabetes this admission. Her A1c is 6.6. Plan is for diet control before starting any medications. She was hypokalemic throughout the admission, potassium was repleted as necessary.  She is on labetalol and diltiazem for rate control of her A. Fib. Her digoxin was stopped this admission as her dig level was high in the context of hypokalemia. Her rate has remained in the 80s during this hospitalization. Her blood pressure is well controlled. She will continue her spironolactone and increase her  Furosemide to 80 BID.   She will remain off anticoagulation. She is followed by hematology for her chronic thrombocytopenia. Patient was seen today by Dr. Caryl Comes and deemed suitable for discharge. _____________  Discharge Vitals Blood pressure 122/57, pulse 78, temperature 98.6 F (37 C), temperature source Oral, resp. rate 18, height 5' 7"  (1.702 m), weight 358 lb 7.5 oz (162.6 kg), SpO2 96 %.  Filed Weights   01/28/16 0600 01/29/16 0601 01/30/16 0618  Weight: 366 lb 3.2 oz (166.107 kg) 360 lb 14.4 oz (163.703 kg) 358 lb 7.5 oz (162.6 kg)    Labs & Radiologic Studies    Basic Metabolic Panel  Recent Labs  01/29/16 0431 01/30/16 0429  NA 137 137  K 3.1* 3.3*  CL 97* 97*  CO2 28 27  GLUCOSE 162* 165*  BUN 9 9  CREATININE 0.78 0.79  CALCIUM 8.6* 8.6*    Dg Chest Portable 1 View  01/26/2016  CLINICAL DATA:  Shortness of breath EXAM: PORTABLE CHEST 1 VIEW COMPARISON:  12/08/2015 FINDINGS: Chronic cardiomegaly. There is pulmonary venous congestion with cephalized blood flow. No overt edema but limited by patient size and underpenetration. No effusion or air leak. Stable increased markings in the right  infrahilar lung. IMPRESSION: Cardiomegaly and pulmonary venous congestion. Electronically Signed   By: Monte Fantasia M.D.   On: 01/26/2016 12:36    Disposition   Pt is being discharged home today in good condition.  Follow-up Plans & Appointments   Follow up with PCP in 1-2 weeks for diabetes management.  Follow up with Cardiology in 1-2 weeks for CHF follow up.  Follow-up Information    Follow up with [facility].   Specialty:  Cardiology   Why:  Our office will call you to set up an appointment.    Contact information:   [address], [place] [phone]      Follow up with Lujean Amel, MD.   Specialty:  Family Medicine   Why:  Follow up in 1-2 weeks to discuss diabetes management.    Contact information:   [address] [place] [address] [phone]      Discharge Instructions    Diet - low sodium heart healthy    Complete by:  As directed      Increase activity slowly    Complete by:  As directed            Discharge Medications   Current Discharge Medication List    START taking these medications   Details  Potassium Chloride ER 20 MEQ TBCR Take 40 mEq by mouth 2 (two) times daily. Qty: 120 tablet, Refills: 12      CONTINUE these medications which have CHANGED   Details  furosemide (LASIX) 40 MG tablet Take 2 tablets (80 mg total) by mouth 2 (two) times daily. Qty: 60 tablet, Refills: 12      CONTINUE these medications which have NOT CHANGED   Details  Cholecalciferol (VITAMIN D) 2000 UNITS tablet Take 2,000 Units by mouth daily.    Coenzyme Q10 (CO Q 10) 100 MG CAPS Take 100 mg by mouth daily.    cyanocobalamin 1000 MCG tablet Take 1,000 mcg by mouth daily.   Associated Diagnoses: Thrombocytopenia (HCC)    diltiazem (CARDIZEM CD) 240 MG 24 hr capsule Take 1 capsule (240 mg total) by mouth daily. Qty: 30 capsule, Refills: 5    Ferrous Sulfate (IRON) 325  (65 FE) MG TABS Take 1 tablet by mouth daily.   Associated Diagnoses: Thrombocytopenia (HCC)    methocarbamol (ROBAXIN) 500 MG tablet Take 250-750 mg by mouth daily as needed for muscle spasms.     Multiple Vitamins-Minerals (MULTIVITAMIN PO) Take 1 tablet by mouth daily.    propranolol (INDERAL) 20 MG tablet Take 1 tablet (20 mg total) by mouth 2 (two) times daily. Qty: 60 tablet, Refills: 5    spironolactone (ALDACTONE) 25 MG tablet Take 25 mg by mouth daily.       STOP taking these medications     digoxin (LANOXIN) 0.25 MG tablet            Outstanding Labs/Studies  BMP  Duration of Discharge Encounter   Greater than 30 minutes including physician time.  Signed, Arbutus Leas NP 01/30/2016,  2:15 PM

## 2016-02-01 NOTE — Progress Notes (Signed)
This encounter was created in error - please disregard.

## 2016-02-02 ENCOUNTER — Encounter: Payer: BC Managed Care – PPO | Admitting: Cardiology

## 2016-02-04 ENCOUNTER — Telehealth: Payer: Self-pay | Admitting: Cardiovascular Disease

## 2016-02-04 NOTE — Telephone Encounter (Signed)
New Message  Pt c/o vaginal bleeding and redness for the last two days- thinks it is rxn to new med. Please call back and discuss.

## 2016-02-04 NOTE — Telephone Encounter (Signed)
Spoke with pt, she was in the hospital about one week ago and prior to that admission, dr Oval Linsey had doubled her propranolol. In the hospital her furosemide was increased and she was started on potassium. She reports the paperwork from the pharmacy has bruising and bleeding listed for the potassium. She has a hx of non-alcohol cirrhosis of the liver and low platelets, she wants to make sure none of the changes in her medication are causing her surrent problems. She has spotting of blood in her panties and when she wipes, she also reports she is red and irritated. Her medical doctor is out of the office until Monday. Aware dr Oval Linsey is out of the office, will forward to pharm md to advise regarding medications.

## 2016-02-04 NOTE — Telephone Encounter (Signed)
This is not a known side effect of potassium and I don't believe that any of these medications would be causing this side effect.

## 2016-02-09 NOTE — Progress Notes (Signed)
Cardiology Office Note   Date:  02/11/2016   ID:  CHALISA KOBLER, DOB 1956/06/23, MRN [id number]  PCP:  Lujean Amel, MD  Cardiologist:   Skeet Latch, MD  Dr. Paulita Fujita: GI Dr. Chauncey Cruel  Chief Complaint  Patient presents with  . Follow-up    1 month--post hosp for SOB and swelling--had gained 20lbs in 1 week, legs were weeping and had blisters, left was worse pt c/o SOB on exertion; occasional random dizziness; swelling in legs/feet/ankles--wears compression stockings    Patient ID:  Hannah Khan is a 60 y.o. female with hypertension, atrial fibrillation, diabetes mellitus type 2, hyperlipidemia, cryptogenic cirrhosis, thrombocytopenia, and morbid obesity who presents for follow-up.  Hannah Khan was admitted to the hospital 3/21 through 3/25 with atrial fibrillation with rapid ventricular response. Given her history of portal hypertension and cirrhosis she was not anticoagulated. She was started on diltiazem for rate control and continued on her home propranolol. Her rate did not stay consistently less than 100 bpm so she was started on digoxin as well.  Echo revealed normal systolic function and her thyroid function was normal.  Since discharge she has struggled with heart rate control, lower extremity edema, and shortness of breath.   Since her last appointment she was admitted to the hospital 7/6-2/83 with diastolic heart failure.  She was diuresed with IV lasix and digoxin was discontinued due to hypokalemia.   Hannah Khan continues to struggle since being discharged.  Some mornings she can hardly function because of low BP.  She brings a log of her blood pressures that shows her heart rate has been in the 50s-90s with BP 80-110/50-70.  She continues to have lower extremity edema and wears TED hose, which helps.  Her response to lasix is variable.  She denies orthopnea or PND.  At times her legs blister due to the swelling.  Hannah Khan continues to have shortness of breath with minimal  exertion.  She also notes vaginal bleeding that started recently.  She has a pelvic ultrasound pending.    Past Medical History  Diagnosis Date  . Fibromyalgia   . Hx of colonic polyps     s/p partial colectomy  . HTN (hypertension)   . ITP (idiopathic thrombocytopenic purpura)   . Sleep apnea     uses cpap setting of 14  . Liver cirrhosis secondary to NASH (nonalcoholic steatohepatitis) dx nov 2014    had enlarged spleen also   . PONV (postoperative nausea and vomiting)     in past none recent  . Diabetes mellitus without complication ([place])   . Hyperlipidemia   . Portal hypertension ([place])   . Atrial fibrillation ([place])   . Morbid obesity [facility])     Past Surgical History  Procedure Laterality Date  . Colectomy  2011  . Knee arthroscopy  yrs ago    bilateral, one done x 1, one done twice  . Cholecystectomy  20 yrs ago  . Cesarean section  1989  . Esophagogastroduodenoscopy (egd) with propofol N/A 09/04/2013    Procedure: ESOPHAGOGASTRODUODENOSCOPY (EGD) WITH PROPOFOL;  Surgeon: [name], MD;  Location: [facility];  Service: Endoscopy;  Laterality: N/A;     Current Outpatient Prescriptions  Medication Sig Dispense Refill  . Cholecalciferol (VITAMIN D) 2000 UNITS tablet Take 2,000 Units by mouth daily.    . Coenzyme Q10 (CO Q 10) 100 MG CAPS Take 100 mg by mouth daily.    . cyanocobalamin 1000 MCG tablet Take 1,000 mcg by mouth  daily.    . diltiazem (CARDIZEM CD) 240 MG 24 hr capsule Take 1 capsule (240 mg total) by mouth daily. 30 capsule 5  . Ferrous Sulfate (IRON) 325 (65 FE) MG TABS Take 1 tablet by mouth daily.    . methocarbamol (ROBAXIN) 500 MG tablet Take 250-750 mg by mouth daily as needed for muscle spasms.     . Multiple Vitamins-Minerals (MULTIVITAMIN PO) Take 1 tablet by mouth daily.    . Potassium Chloride ER 20 MEQ TBCR Take 40 mEq by mouth 2 (two) times daily. 120 tablet 12  . propranolol (INDERAL) 10 MG tablet Take 1 tablet (10 mg total) by mouth 2  (two) times daily. 180 tablet 1  . spironolactone (ALDACTONE) 25 MG tablet Take 25 mg by mouth daily.     . bumetanide (BUMEX) 2 MG tablet Take 1 tablet (2 mg total) by mouth daily. 60 tablet 5   No current facility-administered medications for this visit.    Allergies:   Celecoxib; Prednisone; Shellfish allergy; and Statins    Social History:  The patient  reports that she quit smoking about 41 years ago. Her smoking use included Cigarettes. She has a 3 pack-year smoking history. She has never used smokeless tobacco. She reports that she drinks about 0.6 oz of alcohol per week. She reports that she does not use illicit drugs.   Family History:  [name] family history includes Cancer in her brother, father, and mother; Heart failure in her mother; Hyperlipidemia in her brother and mother; Lung cancer in her father; Stroke in her other.    ROS:  Please see the history of present illness.   Otherwise, review of systems are positive for none.   All other systems are reviewed and negative.    PHYSICAL EXAM: VS:  BP 90/56 mmHg  Pulse 82  Ht 5' 7"  (1.702 m)  Wt 162.751 kg (358 lb 12.8 oz)  BMI 56.18 kg/m2 , BMI Body mass index is 56.18 kg/(m^2). GENERAL:  Well appearing. Morbidly obese HEENT:  Pupils equal round and reactive, fundi not visualized, oral mucosa unremarkable NECK:  No jugular venous distention, waveform within normal limits, carotid upstroke brisk and symmetric, no bruits, no thyromegaly LYMPHATICS:  No cervical adenopathy LUNGS:  Clear to auscultation bilaterally HEART:  Irregularly irregular.  Tachycardic PMI not displaced or sustained,S1 and S2 within normal limits, no S3, no S4, no clicks, no rubs, no murmurs ABD:  Flat, positive bowel sounds normal in frequency in pitch, no bruits, no rebound, no guarding, no midline pulsatile mass, no hepatomegaly, no splenomegaly EXT:  2 plus pulses throughout, no edema, no cyanosis no clubbing SKIN:  No rashes no nodules NEURO:   Cranial nerves II through XII grossly intact, motor grossly intact throughout PSYCH:  Cognitively intact, oriented to person place and time   EKG:  EKG is not ordered today.    Echo 12/10/15: Study Conclusions  - Procedure narrative: Transthoracic echocardiography. The study  was technically difficult, as a result of body habitus.  Intravenous contrast (Definity) was administered. - Left ventricle: The cavity size was normal. Wall thickness was  increased in a pattern of mild LVH. Systolic function was normal.  The estimated ejection fraction was in the range of 55% to 60%.  Wall motion was normal; there were no regional wall motion  abnormalities. The study is not technically sufficient to allow  evaluation of LV diastolic function. - Mitral valve: Mildly thickened leaflets . There was trivial  regurgitation. - Left  atrium: The atrium was mildly dilated. - Inferior vena cava: The vessel was dilated. The respirophasic  diameter changes were blunted (< 50%), consistent with elevated  central venous pressure. - Pericardium, extracardiac: A trivial pericardial effusion was  identified posterior to the heart.  Impressions:  - Technically difficult study. The patient was in atrial flutter  during the study. The LVEF is 55-60%, mild LVH, grossly normal  wall motion, trivial MR, mild LAE, dilated IVC, trivial posterior  pericardial effusion.  Recent Labs: 12/09/2015: Magnesium 1.7 01/26/2016: B Natriuretic Peptide 223.1* 01/27/2016: ALT 27; Hemoglobin 11.2*; Platelets 34*; TSH 2.932 01/30/2016: BUN 9; Creatinine, Ser 0.79; Potassium 3.3*; Sodium 137    Lipid Panel    Component Value Date/Time   CHOL 175 07/02/2014 1138   TRIG 93 07/02/2014 1138   HDL 59 07/02/2014 1138   CHOLHDL 3.0 07/02/2014 1138   VLDL 19 07/02/2014 1138   LDLCALC 97 07/02/2014 1138      Wt Readings from Last 3 Encounters:  02/11/16 162.751 kg (358 lb 12.8 oz)  01/30/16 162.6 kg (358 lb  7.5 oz)  01/11/16 163.295 kg (360 lb)     ASSESSMENT AND PLAN:  # Persistent atrial fibrillation: Hannah Khan's heart rate is well-controlled but she continues to be fatigued.  This is worse on days when her BP is low, but occurs even when her BP is normal.  She would likely feel much better in sinus rhythm.  However, she is in a difficult situation because her bleeding risk on anticoagulation is very high due to platelet dysfunction and varices.  Her true platelet function is unclear, as she has platelet clumping and her platelets probably aren't 34K as recently reported.  Also, TEE would be risky due to esophageal varices.  She is not a candidate for DCCV or ablation unless she can have a TEE or be on anticoagulation for at least 3 weeks prior and 4 weeks after.  Another option would be a surgical MAZE with LA appendage surgical ligation.  However, this would be quite invasive.  A Watchman device would require TEE before and during implantation.  I will contact her GI and hematology doctors to discuss this case.  This patients CHA2DS2-VASc Score and unadjusted Ischemic Stroke Rate (% per year) is equal to 3.2 % stroke rate/year from a score of 3  Above score calculated as 1 point each if present [CHF, HTN, DM, Vascular=MI/PAD/Aortic Plaque, Age if 65-74, or Female] Above score calculated as 2 points each if present [Age > 75, or Stroke/TIA/TE]  # Hypertension:  Blood pressure is low.  We will reduce propranolol to 10 mg.  If her heart rate is >100 bpm she will increase it to 15 mg. Continue diltiazem.   # Chronic diastolic heart failure: Hannah Khan continues to have significant lower extremity edema.  We will stop lasix and start bumex 2 mg bid.  I suspect that she has GI edema that prohibits full absorption of lasix. We will check a BMP tomorrow with her PCP.   Current medicines are reviewed at length with the patient today.  The patient does not have concerns regarding medicines.   The  following changes have been made: Stop lasix and start bumex.   Reduce propranolol to 47m bid.   Labs/ tests ordered today include:   Orders Placed This Encounter  Procedures  . Basic metabolic panel     Time spent: 45 minutes-Greater than 50% of this time was spent in counseling, explanation of diagnosis, planning  of further management, and coordination of care.   Disposition:   FU with Keegan Bensch C. Oval Linsey, MD, [facility] in 1 month.    This note was written with the assistance of speech recognition software.  Please excuse any transcriptional errors.  Signed, Erricka Falkner C. Oval Linsey, MD, [facility]  02/11/2016 11:53 PM    [place]

## 2016-02-09 NOTE — Telephone Encounter (Signed)
Left message to call back  

## 2016-02-10 ENCOUNTER — Other Ambulatory Visit: Payer: Self-pay | Admitting: Nurse Practitioner

## 2016-02-10 DIAGNOSIS — N950 Postmenopausal bleeding: Secondary | ICD-10-CM

## 2016-02-10 NOTE — Telephone Encounter (Signed)
New  Message  Pt returned call

## 2016-02-11 ENCOUNTER — Encounter: Payer: Self-pay | Admitting: Cardiovascular Disease

## 2016-02-11 ENCOUNTER — Ambulatory Visit (INDEPENDENT_AMBULATORY_CARE_PROVIDER_SITE_OTHER): Payer: BC Managed Care – PPO | Admitting: Cardiovascular Disease

## 2016-02-11 VITALS — BP 90/56 | HR 82 | Ht 67.0 in | Wt 358.8 lb

## 2016-02-11 DIAGNOSIS — I10 Essential (primary) hypertension: Secondary | ICD-10-CM | POA: Diagnosis not present

## 2016-02-11 DIAGNOSIS — E876 Hypokalemia: Secondary | ICD-10-CM

## 2016-02-11 DIAGNOSIS — I481 Persistent atrial fibrillation: Secondary | ICD-10-CM

## 2016-02-11 DIAGNOSIS — I4819 Other persistent atrial fibrillation: Secondary | ICD-10-CM

## 2016-02-11 MED ORDER — PROPRANOLOL HCL 10 MG PO TABS: 10.0000 mg | ORAL_TABLET | Freq: Two times a day (BID) | ORAL | Status: DC

## 2016-02-11 MED ORDER — PROPRANOLOL HCL 10 MG PO TABS: 20.0000 mg | ORAL_TABLET | Freq: Two times a day (BID) | ORAL | Status: DC

## 2016-02-11 MED ORDER — BUMETANIDE 2 MG PO TABS: 2.0000 mg | ORAL_TABLET | Freq: Every day | ORAL | Status: DC

## 2016-02-11 NOTE — Telephone Encounter (Signed)
Left message sorry no return call yesterday, not in the office. Keep ov today

## 2016-02-11 NOTE — Patient Instructions (Addendum)
Medication Instructions:  STOP FUROSEMIDE (LASIX)  START BUMEX 2 MG TWICE A DAY  DECREASE YOUR PROPRANOLOL TO 10 MG TWICE A DAY   Labwork: BMET AT PCP TOMORROW   Testing/Procedures: NONE  Follow-Up: Your physician recommends that you schedule a follow-up appointment in: ABOUT 3 WEEKS   If you need a refill on your cardiac medications before your next appointment, please call your pharmacy.

## 2016-02-12 ENCOUNTER — Telehealth: Payer: Self-pay | Admitting: Cardiovascular Disease

## 2016-02-12 MED ORDER — BUMETANIDE 2 MG PO TABS: 2.0000 mg | ORAL_TABLET | Freq: Two times a day (BID) | ORAL | Status: DC

## 2016-02-12 NOTE — Telephone Encounter (Signed)
Spoke with pt, she picked up her bumex and the directions on the bottle are once daily and the directions on the AVS say twice daily. Confirmed with pt the bumex was ordered by dr Oval Linsey as 2 mg twice daily. New script sent to the pharmacy

## 2016-02-12 NOTE — Telephone Encounter (Signed)
°  New Prob   Pt has some questions regarding her Bumex medication. Please call.

## 2016-02-17 ENCOUNTER — Ambulatory Visit
Admission: RE | Admit: 2016-02-17 | Discharge: 2016-02-17 | Disposition: A | Discharge status: Home / Self Care | Payer: BC Managed Care – PPO | Source: Ambulatory Visit | Attending: Nurse Practitioner | Admitting: Nurse Practitioner

## 2016-02-17 DIAGNOSIS — N950 Postmenopausal bleeding: Secondary | ICD-10-CM

## 2016-02-23 ENCOUNTER — Other Ambulatory Visit: Payer: Self-pay | Admitting: Nurse Practitioner

## 2016-02-27 NOTE — Progress Notes (Signed)
Cardiology Office Note   Date:  02/27/2016   ID:  SAE HANDRICH, DOB 1956/08/25, MRN [id number]  PCP:  Lujean Amel, MD  Cardiologist:   Skeet Latch, MD  Dr. Paulita Fujita: GI Dr. Su Hilt chief complaint on file.   Patient ID:  Hannah Khan is a 60 y.o. female with hypertension, atrial fibrillation, diabetes mellitus type 2, hyperlipidemia, cryptogenic cirrhosis, thrombocytopenia, and morbid obesity who presents for follow-up.  Hannah Khan was admitted to the hospital 3/21 through 3/25 with atrial fibrillation with rapid ventricular response. Given her history of portal hypertension and cirrhosis she was not anticoagulated. She was started on diltiazem for rate control and continued on her home propranolol. Her rate did not stay consistently less than 100 bpm so she was started on digoxin as well.  Echo revealed normal systolic function and her thyroid function was normal.  Since discharge she has struggled with heart rate control, lower extremity edema, and shortness of breath.   Since her last appointment she was admitted to the hospital 6/4-3/32 with diastolic heart failure.  She was diuresed with IV lasix and digoxin was discontinued due to hypokalemia.   Hannah Khan continues to struggle since being discharged.  Some mornings she can hardly function because of low BP.  She brings a log of her blood pressures that shows her heart rate has been in the 50s-90s with BP 80-110/50-70.  She continues to have lower extremity edema and wears TED hose, which helps.  Her response to lasix is variable.  She denies orthopnea or PND.  At times her legs blister due to the swelling.  Hannah Khan continues to have shortness of breath with minimal exertion.  She also notes vaginal bleeding that started recently.  She has a pelvic ultrasound pending.    Past Medical History  Diagnosis Date  . Fibromyalgia   . Hx of colonic polyps     s/p partial colectomy  . HTN (hypertension)   . ITP (idiopathic  thrombocytopenic purpura)   . Sleep apnea     uses cpap setting of 14  . Liver cirrhosis secondary to NASH (nonalcoholic steatohepatitis) dx nov 2014    had enlarged spleen also   . PONV (postoperative nausea and vomiting)     in past none recent  . Diabetes mellitus without complication ([place])   . Hyperlipidemia   . Portal hypertension ([place])   . Atrial fibrillation ([place])   . Morbid obesity [facility])     Past Surgical History  Procedure Laterality Date  . Colectomy  2011  . Knee arthroscopy  yrs ago    bilateral, one done x 1, one done twice  . Cholecystectomy  20 yrs ago  . Cesarean section  1989  . Esophagogastroduodenoscopy (egd) with propofol N/A 09/04/2013    Procedure: ESOPHAGOGASTRODUODENOSCOPY (EGD) WITH PROPOFOL;  Surgeon: [name], MD;  Location: [facility];  Service: Endoscopy;  Laterality: N/A;     Current Outpatient Prescriptions  Medication Sig Dispense Refill  . bumetanide (BUMEX) 2 MG tablet Take 1 tablet (2 mg total) by mouth 2 (two) times daily. 60 tablet 5  . Cholecalciferol (VITAMIN D) 2000 UNITS tablet Take 2,000 Units by mouth daily.    . Coenzyme Q10 (CO Q 10) 100 MG CAPS Take 100 mg by mouth daily.    . cyanocobalamin 1000 MCG tablet Take 1,000 mcg by mouth daily.    Marland Kitchen diltiazem (CARDIZEM CD) 240 MG 24 hr capsule Take 1 capsule (240 mg total) by  mouth daily. 30 capsule 5  . Ferrous Sulfate (IRON) 325 (65 FE) MG TABS Take 1 tablet by mouth daily.    . methocarbamol (ROBAXIN) 500 MG tablet Take 250-750 mg by mouth daily as needed for muscle spasms.     . Multiple Vitamins-Minerals (MULTIVITAMIN PO) Take 1 tablet by mouth daily.    . Potassium Chloride ER 20 MEQ TBCR Take 40 mEq by mouth 2 (two) times daily. 120 tablet 12  . propranolol (INDERAL) 10 MG tablet Take 1 tablet (10 mg total) by mouth 2 (two) times daily. 180 tablet 1  . spironolactone (ALDACTONE) 25 MG tablet Take 25 mg by mouth daily.      No current facility-administered medications for  this visit.    Allergies:   Celecoxib; Prednisone; Shellfish allergy; and Statins    Social History:  The patient  reports that she quit smoking about 41 years ago. Her smoking use included Cigarettes. She has a 3 pack-year smoking history. She has never used smokeless tobacco. She reports that she drinks about 0.6 oz of alcohol per week. She reports that she does not use illicit drugs.   Family History:  [name] family history includes Cancer in her brother, father, and mother; Heart failure in her mother; Hyperlipidemia in her brother and mother; Lung cancer in her father; Stroke in her other.    ROS:  Please see the history of present illness.   Otherwise, review of systems are positive for none.   All other systems are reviewed and negative.    PHYSICAL EXAM: VS:  There were no vitals taken for this visit. , BMI There is no weight on file to calculate BMI. GENERAL:  Well appearing. Morbidly obese HEENT:  Pupils equal round and reactive, fundi not visualized, oral mucosa unremarkable NECK:  No jugular venous distention, waveform within normal limits, carotid upstroke brisk and symmetric, no bruits, no thyromegaly LYMPHATICS:  No cervical adenopathy LUNGS:  Clear to auscultation bilaterally HEART:  Irregularly irregular.  Tachycardic PMI not displaced or sustained,S1 and S2 within normal limits, no S3, no S4, no clicks, no rubs, no murmurs ABD:  Flat, positive bowel sounds normal in frequency in pitch, no bruits, no rebound, no guarding, no midline pulsatile mass, no hepatomegaly, no splenomegaly EXT:  2 plus pulses throughout, no edema, no cyanosis no clubbing SKIN:  No rashes no nodules NEURO:  Cranial nerves II through XII grossly intact, motor grossly intact throughout PSYCH:  Cognitively intact, oriented to person place and time   EKG:  EKG is not ordered today.    Echo 12/10/15: Study Conclusions  - Procedure narrative: Transthoracic echocardiography. The study  was  technically difficult, as a result of body habitus.  Intravenous contrast (Definity) was administered. - Left ventricle: The cavity size was normal. Wall thickness was  increased in a pattern of mild LVH. Systolic function was normal.  The estimated ejection fraction was in the range of 55% to 60%.  Wall motion was normal; there were no regional wall motion  abnormalities. The study is not technically sufficient to allow  evaluation of LV diastolic function. - Mitral valve: Mildly thickened leaflets . There was trivial  regurgitation. - Left atrium: The atrium was mildly dilated. - Inferior vena cava: The vessel was dilated. The respirophasic  diameter changes were blunted (< 50%), consistent with elevated  central venous pressure. - Pericardium, extracardiac: A trivial pericardial effusion was  identified posterior to the heart.  Impressions:  - Technically difficult study. The  patient was in atrial flutter  during the study. The LVEF is 55-60%, mild LVH, grossly normal  wall motion, trivial MR, mild LAE, dilated IVC, trivial posterior  pericardial effusion.  Recent Labs: 12/09/2015: Magnesium 1.7 01/26/2016: B Natriuretic Peptide 223.1* 01/27/2016: ALT 27; Hemoglobin 11.2*; Platelets 34*; TSH 2.932 01/30/2016: BUN 9; Creatinine, Ser 0.79; Potassium 3.3*; Sodium 137    Lipid Panel    Component Value Date/Time   CHOL 175 07/02/2014 1138   TRIG 93 07/02/2014 1138   HDL 59 07/02/2014 1138   CHOLHDL 3.0 07/02/2014 1138   VLDL 19 07/02/2014 1138   LDLCALC 97 07/02/2014 1138      Wt Readings from Last 3 Encounters:  02/11/16 358 lb 12.8 oz (162.751 kg)  01/30/16 358 lb 7.5 oz (162.6 kg)  01/11/16 360 lb (163.295 kg)     ASSESSMENT AND PLAN:  # Persistent atrial fibrillation: Ms. Fang's heart rate is well-controlled but she continues to be fatigued.  This is worse on days when her BP is low, but occurs even when her BP is normal.  She would likely feel much  better in sinus rhythm.  However, she is in a difficult situation because her bleeding risk on anticoagulation is very high due to platelet dysfunction and varices.  Her true platelet function is unclear, as she has platelet clumping and her platelets probably aren't 34K as recently reported.  Also, TEE would be risky due to esophageal varices.  She is not a candidate for DCCV or ablation unless she can have a TEE or be on anticoagulation for at least 3 weeks prior and 4 weeks after.  Another option would be a surgical MAZE with LA appendage surgical ligation.  However, this would be quite invasive.  A Watchman device would require TEE before and during implantation.  I will contact her GI and hematology doctors to discuss this case.  This patients CHA2DS2-VASc Score and unadjusted Ischemic Stroke Rate (% per year) is equal to 3.2 % stroke rate/year from a score of 3  Above score calculated as 1 point each if present [CHF, HTN, DM, Vascular=MI/PAD/Aortic Plaque, Age if 65-74, or Female] Above score calculated as 2 points each if present [Age > 75, or Stroke/TIA/TE]  # Hypertension:  Blood pressure is low.  We will reduce propranolol to 10 mg.  If her heart rate is >100 bpm she will increase it to 15 mg. Continue diltiazem.   # Chronic diastolic heart failure: Ms. Sokolow continues to have significant lower extremity edema.  We will stop lasix and start bumex 2 mg bid.  I suspect that she has GI edema that prohibits full absorption of lasix. We will check a BMP tomorrow with her PCP.   Current medicines are reviewed at length with the patient today.  The patient does not have concerns regarding medicines.   The following changes have been made: Stop lasix and start bumex.   Reduce propranolol to 79m bid.   Labs/ tests ordered today include:   No orders of the defined types were placed in this encounter.     Time spent: 45 minutes-Greater than 50% of this time was spent in counseling, explanation  of diagnosis, planning of further management, and coordination of care.   Disposition:   FU with [employer] C. ROval Linsey MD, [facility]in 1 month.    This note was written with the assistance of speech recognition software.  Please excuse any transcriptional errors.  Signed, Aiyla Baucom C. ROval Linsey MD, F[facility] 02/27/2016 7:38  PM    [facility]  This encounter was created in error - please disregard.

## 2016-02-29 ENCOUNTER — Encounter: Payer: BC Managed Care – PPO | Admitting: Cardiovascular Disease

## 2016-03-01 ENCOUNTER — Ambulatory Visit: Payer: BC Managed Care – PPO | Admitting: Cardiovascular Disease

## 2016-03-04 ENCOUNTER — Other Ambulatory Visit: Payer: Self-pay | Admitting: Cardiovascular Disease

## 2016-03-04 MED ORDER — PROPRANOLOL HCL 10 MG PO TABS: 10.0000 mg | ORAL_TABLET | Freq: Two times a day (BID) | ORAL | Status: DC

## 2016-03-04 NOTE — Telephone Encounter (Signed)
Rx(s) sent to pharmacy electronically.  

## 2016-03-04 NOTE — Telephone Encounter (Signed)
°*  STAT* If patient is at the pharmacy, call can be transferred to refill team.   1. Which medications need to be refilled? (please list name of each medication and dose if known) Propranolol 10 mg-new prescription-she takes 2 a day  2. Which pharmacy/location (including street and city if local pharmacy) is medication to be sent to?[phone]  3. Do they need a 30 day or 90 day supply? #180 and refills

## 2016-03-07 ENCOUNTER — Telehealth: Payer: Self-pay | Admitting: Cardiovascular Disease

## 2016-03-07 DIAGNOSIS — I4819 Other persistent atrial fibrillation: Secondary | ICD-10-CM

## 2016-03-07 NOTE — Telephone Encounter (Signed)
After conversations with Dr. Paulita Fujita and Dr. Alen Blew Hannah Khan is at high risk for bleeding with anti-platelet or anticoagulation agents. However, given that atrial fibrillation is significantly impairing her quality of life, the benefit may outweigh he risk.  Per Dr. Alen Blew, her platelet function and count are probably close to normal.  He is more concerned with anticoagulants than antiplatelets.  At her last EGD in 2014 there were gastric varices but no esophageal varices.  Dr. Paulita Fujita would be willing to perform an EGD immediately prior to her TEE to ensure that she still does no have esophageal varices.  This would need to be coordinated with his endoscopy schedule.    Will refer to Dr. Rayann Heman for consideration of a Watchman device with plans for only 1 month of aspirin and Plavix post implantation, no warfarin.  Clearly, this is off guidelines and may not be an option.  She understands and accepts the risks.  Hannah Pates C. Oval Linsey, MD, [facility]  03/07/2016  9:38 AM

## 2016-03-08 NOTE — Addendum Note (Signed)
Addended by: Earvin Hansen on: 03/08/2016 06:23 PM   Modules accepted: Orders

## 2016-03-15 ENCOUNTER — Other Ambulatory Visit: Payer: Self-pay

## 2016-03-15 NOTE — Progress Notes (Signed)
Watchman Consult Note   Date:  03/16/2016   ID:  Hannah Khan, DOB July 10, 1956, MRN [id number]  PCP:  Hannah Amel, MD  Cardiologist:  Oval Linsey Referring Physician: Oval Linsey   CC: to discuss Watchman implant    History of Present Illness: Hannah Khan is a 60 y.o. female who presents today for evaluation of left atrial appendage occluder.  She has persistent atrial fibrillation as well as hypertension, diabetes, hyperlipidemia, cryptogenic cirrhosis, thrombocytopenia, and morbid obesity.  The patient has been evaluated by their referring physician and is felt to be a poor candidate for long term [place] due to cirrhosis and esophageal varices.  She therefore presents today for Watchman evaluation.   She is symptomatic with her atrial fibrillation but is unable to be anticoagulated for the above reasons. Dr Oval Linsey has spoke with the patient's GI MD and hematologist who felt that she could potentially have EGD immediately prior to TEE followed by ASA/plavix for 1 month post procedure.   She was first diagnosed with AF in March of this year but in retrospect feels that she was prob in AF for a couple of months before due to symptoms of fatigue and shortness of breath.   Echo 11/2015 demonstrated EF 55-60%, no RWMA, trivial pericardial effusion, LA 45.   She feels much better since propranolol dose recently decreased. She has improved dyspnea and fatigue but is still not at baseline.  She previously lost 85 pounds but was unable to afford to continue her gym membership.   Today, she denies symptoms of palpitations, chest pain, orthopnea, PND, claudication, dizziness, presyncope, syncope, bleeding, or neurologic sequela. The patient is tolerating medications without difficulties and is otherwise without complaint today.    Past Medical History  Diagnosis Date  . Fibromyalgia   . Hx of colonic polyps     s/p partial colectomy  . HTN (hypertension)   . ITP (idiopathic thrombocytopenic  purpura)   . Sleep apnea     uses cpap setting of 14  . Liver cirrhosis secondary to NASH (nonalcoholic steatohepatitis) dx nov 2014    had enlarged spleen also   . PONV (postoperative nausea and vomiting)     in past none recent  . Diabetes mellitus without complication ([place])   . Hyperlipidemia   . Portal hypertension ([place])   . Atrial fibrillation ([place])   . Morbid obesity [facility])    Past Surgical History  Procedure Laterality Date  . Colectomy  2011  . Knee arthroscopy  yrs ago    bilateral, one done x 1, one done twice  . Cholecystectomy  20 yrs ago  . Cesarean section  1989  . Esophagogastroduodenoscopy (egd) with propofol N/A 09/04/2013    Procedure: ESOPHAGOGASTRODUODENOSCOPY (EGD) WITH PROPOFOL;  Surgeon: [name], MD;  Location: [facility];  Service: Endoscopy;  Laterality: N/A;     Current Outpatient Prescriptions  Medication Sig Dispense Refill  . Blood Glucose Monitoring Suppl (ONE TOUCH ULTRA MINI) w/Device KIT See admin instructions.  0  . bumetanide (BUMEX) 2 MG tablet Take 1 tablet (2 mg total) by mouth 2 (two) times daily. 60 tablet 5  . Cholecalciferol (VITAMIN D) 2000 UNITS tablet Take 2,000 Units by mouth daily.    . Coenzyme Q10 (CO Q 10) 100 MG CAPS Take 100 mg by mouth daily.    . cyanocobalamin 1000 MCG tablet Take 1,000 mcg by mouth daily.    Marland Kitchen diltiazem (CARDIZEM CD) 240 MG 24 hr capsule Take 1 capsule (240 mg total)  by mouth daily. 30 capsule 5  . Ferrous Sulfate (IRON) 325 (65 FE) MG TABS Take 1 tablet by mouth daily.    Marland Kitchen glimepiride (AMARYL) 2 MG tablet Take 1 tablet by mouth daily with breakfast.    . methocarbamol (ROBAXIN) 500 MG tablet Take 250-750 mg by mouth daily as needed for muscle spasms.     . Multiple Vitamins-Minerals (MULTIVITAMIN PO) Take 1 tablet by mouth daily.    . ONE TOUCH ULTRA TEST test strip as directed.  0  . ONETOUCH DELICA LANCETS 84X MISC as directed.  0  . Potassium Chloride ER 20 MEQ TBCR Take 40 mEq by mouth 2  (two) times daily. 120 tablet 12  . propranolol (INDERAL) 10 MG tablet Take 1 tablet (10 mg total) by mouth 2 (two) times daily. 180 tablet 0  . spironolactone (ALDACTONE) 25 MG tablet Take 25 mg by mouth daily.      No current facility-administered medications for this visit.    Allergies:   Celecoxib; Prednisone; Shellfish allergy; and Statins   Social History:  The patient  reports that she quit smoking about 41 years ago. Her smoking use included Cigarettes. She has a 3 pack-year smoking history. She has never used smokeless tobacco. She reports that she drinks about 0.6 oz of alcohol per week. She reports that she does not use illicit drugs.   Family History:  [name] family history includes Cancer in her brother, father, and mother; Heart failure in her mother; Hyperlipidemia in her brother and mother; Lung cancer in her father; Stroke in her other.    ROS:  Please see the history of present illness.   All other systems are reviewed and negative.    PHYSICAL EXAM: VS:  BP 112/66 mmHg  Pulse 89  Ht 5' 7"  (1.702 m)  Wt 336 lb (152.409 kg)  BMI 52.61 kg/m2 , BMI Body mass index is 52.61 kg/(m^2). GEN: Morbidly obese, well developed, in no acute distress HEENT: normal Cardiac: iRRR; no murmurs, rubs, or gallops,no edema  Respiratory:  clear to auscultation bilaterally, normal work of breathing GI: soft, nontender, nondistended, + BS MS: no deformity or atrophy Skin: warm and dry  Neuro:  Strength and sensation are intact Psych: euthymic mood, full affect  EKG:  EKG is ordered today. EKG today shows AF, V rate 89, iRBBB   Recent Labs: 12/09/2015: Magnesium 1.7 01/26/2016: B Natriuretic Peptide 223.1* 01/27/2016: ALT 27; Hemoglobin 11.2*; Platelets 34*; TSH 2.932 01/30/2016: BUN 9; Creatinine, Ser 0.79; Potassium 3.3*; Sodium 137    Lipid Panel     Component Value Date/Time   CHOL 175 07/02/2014 1138   TRIG 93 07/02/2014 1138   HDL 59 07/02/2014 1138   CHOLHDL 3.0  07/02/2014 1138   VLDL 19 07/02/2014 1138   LDLCALC 97 07/02/2014 1138     Wt Readings from Last 3 Encounters:  03/16/16 336 lb (152.409 kg)  02/11/16 358 lb 12.8 oz (162.751 kg)  01/30/16 358 lb 7.5 oz (162.6 kg)      Other studies Reviewed: Additional studies/ records that were reviewed today include: Dr Blenda Mounts office notes   ASSESSMENT AND PLAN:  1.  Persistent atrial fibrillation I have seen CALA KRUCKENBERG is a 60 y.o. female in the office today who has been referred by Dr Oval Linsey for a Watchman left atrial appendage closure device.  She has a history of persistent atrial fibrillation.  This patients CHA2DS2-VASc Score and unadjusted Ischemic Stroke Rate (% per year) is equal  to 2.2 % stroke rate/year from a score of 2 which necessitates long term oral anticoagulation to prevent stroke. HasBled score is 4. Unfortunately, She is not felt to be a long term Warfarin candidate secondary to esophageal varices and cirrhosis. Patient's GI MD and hematologist feel that she could tolerate 1 month of ASA/Plavix. Unfortunately, this is off label and the risks of implanting Watchman with only 4 weeks of ASA/Plavix are not known.  I have discussed this extensively with the patient and her husband today who understand.    I think at this point, the best option we have to treat her AF is lifestyle modification.  We have discussed weight loss (which she has successfully done in the past) and exercise.  She is willing to return to working out but is unable to afford the gym membership.  I will check with social work in the AF clinic to see if there are options to help augment costs.  2.  Morbid obesity Body mass index is 52.61 kg/(m^2). As above Without significant weight loss, long term prognosis is poor   Follow-up:  With Dr Oval Linsey as scheduled  Current medicines are reviewed at length with the patient today.   The patient does not have concerns regarding her medicines.  The following  changes were made today:  none  Labs/ tests ordered today include: none  No orders of the defined types were placed in this encounter.     Army Fossa, MD  03/16/2016 10:28 AM     [address] [place] [address] [phone] (office) [phone] (fax)

## 2016-03-16 ENCOUNTER — Telehealth: Payer: Self-pay | Admitting: Licensed Clinical Social Worker

## 2016-03-16 ENCOUNTER — Ambulatory Visit (INDEPENDENT_AMBULATORY_CARE_PROVIDER_SITE_OTHER): Payer: BC Managed Care – PPO | Admitting: Internal Medicine

## 2016-03-16 ENCOUNTER — Encounter: Payer: Self-pay | Admitting: Internal Medicine

## 2016-03-16 VITALS — BP 112/66 | HR 89 | Ht 67.0 in | Wt 336.0 lb

## 2016-03-16 DIAGNOSIS — I481 Persistent atrial fibrillation: Secondary | ICD-10-CM | POA: Diagnosis not present

## 2016-03-16 DIAGNOSIS — I4819 Other persistent atrial fibrillation: Secondary | ICD-10-CM

## 2016-03-16 DIAGNOSIS — I10 Essential (primary) hypertension: Secondary | ICD-10-CM | POA: Diagnosis not present

## 2016-03-16 NOTE — Patient Instructions (Addendum)
Medication Instructions:  Your physician recommends that you continue on your current medications as directed. Please refer to the Current Medication list given to you today.   Labwork: None ordered   Testing/Procedures: None ordered   Follow-Up: Your physician recommends that you schedule a follow-up appointment as needed and as scheduled wit Dr Oval Linsey   Any Other Special Instructions Will Be Listed Below (If Applicable).     If you need a refill on your cardiac medications before your next appointment, please call your pharmacy.

## 2016-03-16 NOTE — Telephone Encounter (Signed)
CSW received referral from Chanetta Marshall, NP requesting assistance with obtaining a gym membership for patient to assist weight loss. CSW explored options through [employer] program although patient reports that [employer] [place] minutes from home each way and it would hinder her compliance with attending the gym. Patient has BC/BS which may cover gym membership. CSW informed patient to follow up with insurance to inquire about coverage. CSW also discussed possible membership at [employer] which offers a $10 monthly options. Patient will explore options and return call to CSW if needed. Raquel Sarna, LCSW [phone]

## 2016-03-28 ENCOUNTER — Institutional Professional Consult (permissible substitution): Payer: BC Managed Care – PPO | Admitting: Internal Medicine

## 2016-04-01 ENCOUNTER — Telehealth: Payer: Self-pay | Admitting: *Deleted

## 2016-04-01 NOTE — Telephone Encounter (Signed)
Request for surgical clearance:  1. What type of surgery is being performed? Hysteroscopy/D&C/Polypectomy    2. When is this surgery scheduled? To be determined    3. Are there any medications that need to be held prior to surgery and how long?    4. Name of physician performing surgery? [facility]   5. What is your office phone and fax number? [phone] fax [phone] attn: [name]   Received via fax, will forward to Dr Oval Linsey for review

## 2016-04-01 NOTE — Telephone Encounter (Signed)
Low risk for her GYN procedure.  She is not on any blood thinners.

## 2016-04-04 NOTE — Telephone Encounter (Signed)
Sent via Epic

## 2016-04-08 ENCOUNTER — Other Ambulatory Visit (HOSPITAL_BASED_OUTPATIENT_CLINIC_OR_DEPARTMENT_OTHER): Payer: BC Managed Care – PPO

## 2016-04-08 ENCOUNTER — Ambulatory Visit (HOSPITAL_BASED_OUTPATIENT_CLINIC_OR_DEPARTMENT_OTHER): Payer: BC Managed Care – PPO | Admitting: Oncology

## 2016-04-08 ENCOUNTER — Telehealth: Payer: Self-pay | Admitting: Oncology

## 2016-04-08 VITALS — BP 128/47 | HR 97 | Temp 98.1°F | Resp 18 | Ht 67.0 in | Wt 338.7 lb

## 2016-04-08 DIAGNOSIS — I4891 Unspecified atrial fibrillation: Secondary | ICD-10-CM

## 2016-04-08 DIAGNOSIS — D696 Thrombocytopenia, unspecified: Secondary | ICD-10-CM

## 2016-04-08 DIAGNOSIS — D6959 Other secondary thrombocytopenia: Secondary | ICD-10-CM

## 2016-04-08 DIAGNOSIS — K7460 Unspecified cirrhosis of liver: Secondary | ICD-10-CM

## 2016-04-08 LAB — CBC WITH DIFFERENTIAL/PLATELET
BASO%: 0.7 % (ref 0.0–2.0)
BASOS ABS: 0 10*3/uL (ref 0.0–0.1)
EOS ABS: 0.1 10*3/uL (ref 0.0–0.5)
EOS%: 1.1 % (ref 0.0–7.0)
HEMATOCRIT: 34.1 % — AB (ref 34.8–46.6)
HEMOGLOBIN: 11.7 g/dL (ref 11.6–15.9)
LYMPH#: 1.1 10*3/uL (ref 0.9–3.3)
LYMPH%: 24 % (ref 14.0–49.7)
MCH: 30.8 pg (ref 25.1–34.0)
MCHC: 34.3 g/dL (ref 31.5–36.0)
MCV: 89.7 fL (ref 79.5–101.0)
MONO#: 0.5 10*3/uL (ref 0.1–0.9)
MONO%: 10.1 % (ref 0.0–14.0)
NEUT#: 2.9 10*3/uL (ref 1.5–6.5)
NEUT%: 64.1 % (ref 38.4–76.8)
NRBC: 0 % (ref 0–0)
PLATELETS: 33 10*3/uL — AB (ref 145–400)
RBC: 3.8 10*6/uL (ref 3.70–5.45)
RDW: 15 % — AB (ref 11.2–14.5)
WBC: 4.5 10*3/uL (ref 3.9–10.3)

## 2016-04-08 NOTE — Telephone Encounter (Signed)
Gave pt cal & avs °

## 2016-04-08 NOTE — Progress Notes (Signed)
Hematology and Oncology Follow Up Visit  RELDA Khan [id number] 1956/02/27 60 y.o. 04/08/2016 3:03 PM  CC: Hannah Silence, MD Hannah Khan, M.D. Hannah Khan. Hannah Pulling, MD Hannah Khan, M.D.    Principle Diagnosis: This is a 60 year old female with the following issues:   1. Thrombocytopenia related to cirrhosis of the liver as well as platelet clumping. She is currently on observation and surveillance. 2.     Status post a partial colectomy due to a tubular adenoma, without any evidence of high-grade dysplasia or malignancy.   Current therapy: Observation, surveillance  Interim History: Hannah Khan presents today for a follow-up visit. Since her last visit, she was diagnosed with atrial fibrillation and currently remains in that rhythm. She has been hospitalized on few occasions and follows with cardiology regularly. She is currently rate controlled with Cardizem and seems to be managing reasonably well. Her blood pressure had been running low or which caused her some symptoms of fatigue and tiredness but now seems to be close to normal. She is able to function and perform activities of daily living.  She denied any hemoptysis or hematemesis. She did report vaginal bleeding and underwent ultrasound and a biopsy of a polyp in her uterus. She is scheduled to have a D&C in the future because of hyperplasia. Overall, she has no complaints at this time.  She does not report any headaches, blurry vision, syncope or seizures. She has not reported any decline in her energy her performance status. She does not report any fevers or chills or sweats.She has not reported any chest pain or shortness of breath. She does not report any nausea, vomiting or abdominal pain. Does not report any cough or hemoptysis.rest of her review of systems unremarkable.  Medications: I have reviewed the patient's current medications.  Current Outpatient Prescriptions  Medication Sig Dispense Refill  . Blood  Glucose Monitoring Suppl (ONE TOUCH ULTRA MINI) w/Device KIT See admin instructions.  0  . bumetanide (BUMEX) 2 MG tablet Take 1 tablet (2 mg total) by mouth 2 (two) times daily. 60 tablet 5  . Cholecalciferol (VITAMIN D) 2000 UNITS tablet Take 2,000 Units by mouth daily.    . Coenzyme Q10 (CO Q 10) 100 MG CAPS Take 100 mg by mouth daily.    . cyanocobalamin 1000 MCG tablet Take 1,000 mcg by mouth daily.    Marland Kitchen diltiazem (CARDIZEM CD) 240 MG 24 hr capsule Take 1 capsule (240 mg total) by mouth daily. 30 capsule 5  . Ferrous Sulfate (IRON) 325 (65 FE) MG TABS Take 1 tablet by mouth daily.    Marland Kitchen glimepiride (AMARYL) 2 MG tablet Take 1 tablet by mouth daily with breakfast.    . methocarbamol (ROBAXIN) 500 MG tablet Take 250-750 mg by mouth daily as needed for muscle spasms.     . Multiple Vitamins-Minerals (MULTIVITAMIN PO) Take 1 tablet by mouth daily.    . ONE TOUCH ULTRA TEST test strip as directed.  0  . ONETOUCH DELICA LANCETS 40J MISC as directed.  0  . Potassium Chloride ER 20 MEQ TBCR Take 40 mEq by mouth 2 (two) times daily. 120 tablet 12  . propranolol (INDERAL) 10 MG tablet Take 1 tablet (10 mg total) by mouth 2 (two) times daily. 180 tablet 0  . spironolactone (ALDACTONE) 25 MG tablet Take 25 mg by mouth daily.      No current facility-administered medications for this visit.    Allergies:  Allergies  Allergen Reactions  . Celecoxib Other (  See Comments)    "speech slurred" with celebrex  . Prednisone Other (See Comments)    Rapid heart rate  . Shellfish Allergy Swelling    Lips tingle and swell. Whelps the size of quarters.   . Statins Other (See Comments)    aggravated fibromyalgia    Past Medical History, Surgical history, Social history, and Family History were reviewed and updated.   Physical Exam: Blood pressure 128/47, pulse 97, temperature 98.1 F (36.7 C), temperature source Oral, resp. rate 18, height 5' 7"  (1.702 m), weight 338 lb 11.2 oz (153.633 kg), SpO2 100  %. ECOG: 1 General appearance: Well-appearing woman without distress. Head: Normocephalic, without obvious abnormality no oral rash noted. Neck: no adenopathy Lymph nodes: Cervical, supraclavicular, and axillary nodes normal. Heart: Tachycardic without murmurs. Lung:chest clear, no wheezing, rales, normal symmetric air entry Abdomen: soft, non-tender, without masses or organomegaly no shifting dullness or ascites. EXT:no erythema, induration, or nodules. Skin: No petechia noted. No ecchymosis.   Lab Results: Lab Results  Component Value Date   WBC 4.5 04/08/2016   HGB 11.7 04/08/2016   HCT 34.1* 04/08/2016   MCV 89.7 04/08/2016   PLT 33* 04/08/2016     Chemistry      Component Value Date/Time   NA 137 01/30/2016 0429   NA 142 08/01/2014 1509   K 3.3* 01/30/2016 0429   K 4.1 08/01/2014 1509   CL 97* 01/30/2016 0429   CL 107 08/03/2012 1449   CO2 27 01/30/2016 0429   CO2 23 08/01/2014 1509   BUN 9 01/30/2016 0429   BUN 16.9 08/01/2014 1509   CREATININE 0.79 01/30/2016 0429   CREATININE 0.7 08/01/2014 1509   CREATININE 0.62 07/02/2014 1118      Component Value Date/Time   CALCIUM 8.6* 01/30/2016 0429   CALCIUM 9.3 08/01/2014 1509   ALKPHOS 84 01/27/2016 0324   ALKPHOS 82 08/01/2014 1509   AST 45* 01/27/2016 0324   AST 54* 08/01/2014 1509   ALT 27 01/27/2016 0324   ALT 30 08/01/2014 1509   BILITOT 3.0* 01/27/2016 0324   BILITOT 1.71* 08/01/2014 1509      This is a pleasant 60 year old female with the following issues:     1. Thrombocytopenia  due to cirrhosis of the liver. Her platelet count is also underestimated by frequent clumping. Her cirrhosis of the liver have cause splenomegaly from portal hypertension and splenic sequestration. All these findings lead to thrombocytopenia that probably underestimates her actual platelet count. Her platelet count today is 33 without any active bleeding. I see no need for intervention at's time including transfusion and growth  factor support. 2. Atrial fibrillation: She continues to follow with cardiology regarding this issue. She is not a candidate for full dose anticoagulation given her bleeding risk from liver disease and thrombocytopenia. 3. Cirrhosis of the liver: Follow up with gastroenterology regarding this issue. 4. Hysteroscopy and D&C scheduled: Her platelets should be adequate for this procedure but if she develops postoperative bleeding, platelet transfusion might be needed. 5. Follow up: in 3 months scan sooner if needed to.    [facility] 7/21/20173:03 PM

## 2016-04-10 NOTE — Progress Notes (Signed)
Cardiology Office Note   Date:  04/11/2016   ID:  Hannah Khan, DOB [birth year], MRN [id number]  PCP:  Lujean Amel, MD  Cardiologist:   Skeet Latch, MD  Dr. Paulita Fujita: GI Dr. Alen Blew: Hematology  No chief complaint on file.   Patient ID:  Hannah Khan is a 60 y.o. female with hypertension, atrial fibrillation, diabetes mellitus type 2, hyperlipidemia, cryptogenic cirrhosis, thrombocytopenia, and morbid obesity who presents for follow-up.  Ms. Boulay was admitted to the hospital 3/21 through 3/25 with atrial fibrillation with rapid ventricular response. Given her history of portal hypertension and cirrhosis she was not anticoagulated. She was started on diltiazem for rate control and continued on her home propranolol. Her rate did not stay consistently less than 100 bpm so she was started on digoxin as well.  Echo revealed normal systolic function and her thyroid function was normal.  Since discharge she has struggled with heart rate control, lower extremity edema, and shortness of breath.   She was admitted to the hospital 6/6-5/99 with diastolic heart failure.  She was diuresed with IV lasix and digoxin was discontinued due to hypokalemia.  She was seen in follow up 02/2016, at which time she complained of hypotension.  Her heart rate was well-controlled but she continued to feel poorly in AF.  She was referred to Dr. Rayann Heman for consideration of a Watchman device however, he felt that she would not be a good candidate, as she will only be able to be on antiplatelets for one month and she is unlikely to remain in sinus rhythm without significant weight loss. .    Ms. Westermeyer has been feeling much better.  She brings a log of her heart rates which have been in the 80-90s.  Her only complaint is increased lower extremity edema.   Her weight is up 5-6 lb.  She sometimes gets a good response from Bumex, but not every time.  She has been limiting her salt and fluid intake.  She notes that her  energy is much better, but still not at baseline.  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  She denies orthopnea or PND.  She has not been getting any formal exercise other than walking across the [employer].   Past Medical History:  Diagnosis Date  . Atrial fibrillation ([place])   . Diabetes mellitus without complication ([place])   . Fibromyalgia   . HTN (hypertension)   . Hx of colonic polyps    s/p partial colectomy  . Hyperlipidemia   . ITP (idiopathic thrombocytopenic purpura)   . Liver cirrhosis secondary to NASH (nonalcoholic steatohepatitis) dx nov 2014   had enlarged spleen also   . Morbid obesity ([place])   . PONV (postoperative nausea and vomiting)    in past none recent  . Portal hypertension ([place])   . Sleep apnea    uses cpap setting of 14    Past Surgical History:  Procedure Laterality Date  . CESAREAN SECTION  1989  . CHOLECYSTECTOMY  20 yrs ago  . COLECTOMY  2011  . ESOPHAGOGASTRODUODENOSCOPY (EGD) WITH PROPOFOL N/A 09/04/2013   Procedure: ESOPHAGOGASTRODUODENOSCOPY (EGD) WITH PROPOFOL;  Surgeon: [name], MD;  Location: [facility] ENDOSCOPY;  Service: Endoscopy;  Laterality: N/A;  . KNEE ARTHROSCOPY  yrs ago   bilateral, one done x 1, one done twice     Current Outpatient Prescriptions  Medication Sig Dispense Refill  .  Blood Glucose Monitoring Suppl (ONE TOUCH ULTRA MINI) w/Device KIT See admin instructions.  0  . bumetanide (BUMEX) 2 MG tablet Take 1 tablet (2 mg total) by mouth 2 (two) times daily. 60 tablet 5  . Cholecalciferol (VITAMIN D) 2000 UNITS tablet Take 2,000 Units by mouth daily.    . Coenzyme Q10 (CO Q 10) 100 MG CAPS Take 100 mg by mouth daily.    . cyanocobalamin 1000 MCG tablet Take 1,000 mcg by mouth daily.    Marland Kitchen diltiazem (CARDIZEM CD) 240 MG 24 hr capsule Take 1 capsule (240 mg total) by mouth daily. 30 capsule 5  . Ferrous Sulfate (IRON) 325 (65 FE) MG TABS Take 1  tablet by mouth daily.    Marland Kitchen glimepiride (AMARYL) 2 MG tablet Take 1 tablet by mouth daily with breakfast.    . methocarbamol (ROBAXIN) 500 MG tablet Take 250-750 mg by mouth daily as needed for muscle spasms.     . Multiple Vitamins-Minerals (MULTIVITAMIN PO) Take 1 tablet by mouth daily.    . ONE TOUCH ULTRA TEST test strip as directed.  0  . ONETOUCH DELICA LANCETS 56E MISC as directed.  0  . Potassium Chloride ER 20 MEQ TBCR Take 40 mEq by mouth 2 (two) times daily. 120 tablet 12  . propranolol (INDERAL) 10 MG tablet Take 1 tablet (10 mg total) by mouth 2 (two) times daily. 180 tablet 0  . spironolactone (ALDACTONE) 25 MG tablet Take 25 mg by mouth daily.      No current facility-administered medications for this visit.     Allergies:   Celecoxib; Prednisone; Shellfish allergy; and Statins    Social History:  The patient  reports that she quit smoking about 41 years ago. Her smoking use included Cigarettes. She has a 3.00 pack-year smoking history. She has never used smokeless tobacco. She reports that she drinks about 0.6 oz of alcohol per week . She reports that she does not use drugs.   Family History:  [name] family history includes Cancer in her brother, father, and mother; Heart failure in her mother; Hyperlipidemia in her brother and mother; Lung cancer in her father; Stroke in her other.    ROS:  Please see the history of present illness.   Otherwise, review of systems are positive for none.   All other systems are reviewed and negative.    PHYSICAL EXAM: VS:  BP (!) 109/58 (BP Location: Left Arm, Patient Position: Sitting)   Pulse (!) 109   Wt (!) 341 lb (154.7 kg)   BMI 53.41 kg/m  , BMI Body mass index is 53.41 kg/m. GENERAL:  Well appearing. Morbidly obese HEENT:  Pupils equal round and reactive, fundi not visualized, oral mucosa unremarkable NECK:  No jugular venous distention, waveform within normal limits, carotid upstroke brisk and symmetric, no bruits, no  thyromegaly LYMPHATICS:  No cervical adenopathy LUNGS:  Clear to auscultation bilaterally HEART:  Irregularly irregular.  Distant heart sounds.  PMI not displaced or sustained,S1 and S2 within normal limits, no S3, no S4, no clicks, no rubs, II/VI systolic murmur at the RUSB ABD:  Flat, positive bowel sounds normal in frequency in pitch, no bruits, no rebound, no guarding, no midline pulsatile mass, no hepatomegaly, no splenomegaly EXT:  2 plus pulses throughout, no edema, no cyanosis no clubbing SKIN:  No rashes no nodules NEURO:  Cranial nerves II through XII grossly intact, motor grossly intact throughout PSYCH:  Cognitively intact, oriented to person place and time  EKG:  EKG is not ordered today.    Echo 12/10/15: Study Conclusions  - Procedure narrative: Transthoracic echocardiography. The study  was technically difficult, as a result of body habitus.  Intravenous contrast (Definity) was administered. - Left ventricle: The cavity size was normal. Wall thickness was  increased in a pattern of mild LVH. Systolic function was normal.  The estimated ejection fraction was in the range of 55% to 60%.  Wall motion was normal; there were no regional wall motion  abnormalities. The study is not technically sufficient to allow  evaluation of LV diastolic function. - Mitral valve: Mildly thickened leaflets . There was trivial  regurgitation. - Left atrium: The atrium was mildly dilated. - Inferior vena cava: The vessel was dilated. The respirophasic  diameter changes were blunted (< 50%), consistent with elevated  central venous pressure. - Pericardium, extracardiac: A trivial pericardial effusion was  identified posterior to the heart.  Impressions:  - Technically difficult study. The patient was in atrial flutter  during the study. The LVEF is 55-60%, mild LVH, grossly normal  wall motion, trivial MR, mild LAE, dilated IVC, trivial posterior  pericardial  effusion.  Recent Labs: 12/09/2015: Magnesium 1.7 01/26/2016: B Natriuretic Peptide 223.1 01/27/2016: ALT 27; TSH 2.932 01/30/2016: BUN 9; Creatinine, Ser 0.79; Potassium 3.3; Sodium 137 04/08/2016: HGB 11.7; Platelets 33    Lipid Panel    Component Value Date/Time   CHOL 175 07/02/2014 1138   TRIG 93 07/02/2014 1138   HDL 59 07/02/2014 1138   CHOLHDL 3.0 07/02/2014 1138   VLDL 19 07/02/2014 1138   LDLCALC 97 07/02/2014 1138      Wt Readings from Last 3 Encounters:  04/11/16 (!) 341 lb (154.7 kg)  04/08/16 (!) 338 lb 11.2 oz (153.6 kg)  03/16/16 (!) 336 lb (152.4 kg)     ASSESSMENT AND PLAN:  # Persistent atrial fibrillation: Ms. Stokke's heart rate is well-controlled and she is feeling much better.  She Is not a candidate for long-term anticoagulation due to cirrhosis.  This disqualifies her for the Watchman device and therefore we will not pursue a rhythm control strategy.  Continue diltiazem and propranolol. This patients CHA2DS2-VASc Score and unadjusted Ischemic Stroke Rate (% per year) is equal to 3.2 % stroke rate/year from a score of 3 Above score calculated as 1 point each if present [CHF, HTN, DM, Vascular=MI/PAD/Aortic Plaque, Age if 65-74, or Female] Above score calculated as 2 points each if present [Age > 75, or Stroke/TIA/TE]  # Hypertension:  Blood pressure is low but she is asymtomatic.Marland Kitchen  Continue propranolol and diltiazem.   # Chronic diastolic heart failure: Ms. Trussell continues to have significant lower extremity edema.  Increase bumex to 54m bid when weight is up by 2lb in one day or 5lb in one week.  Otherwise continue 261mbid.  She will get a BMP checked with her PCP in 4 days.    Current medicines are reviewed at length with the patient today.  The patient does not have concerns regarding medicines.   The following changes have been made:  Increase bumex prn  Labs/ tests ordered today include:   No orders of the defined types were placed in this  encounter.    Time spent: 45 minutes-Greater than 50% of this time was spent in counseling, explanation of diagnosis, planning of further management, and coordination of care.   Disposition:   FU with Trinady Milewski C. RaOval LinseyMD, [facility] 2 months.    This note was  written with the assistance of speech recognition software.  Please excuse any transcriptional errors.  Signed, Kasha Howeth C. Oval Linsey, MD, [facility]  04/11/2016 7:58 AM    [facility]

## 2016-04-11 ENCOUNTER — Encounter: Payer: Self-pay | Admitting: Cardiovascular Disease

## 2016-04-11 ENCOUNTER — Other Ambulatory Visit: Payer: Self-pay | Admitting: Gastroenterology

## 2016-04-11 ENCOUNTER — Ambulatory Visit (INDEPENDENT_AMBULATORY_CARE_PROVIDER_SITE_OTHER): Payer: BC Managed Care – PPO | Admitting: Cardiovascular Disease

## 2016-04-11 VITALS — BP 109/58 | HR 109 | Ht 67.0 in | Wt 341.0 lb

## 2016-04-11 DIAGNOSIS — E785 Hyperlipidemia, unspecified: Secondary | ICD-10-CM

## 2016-04-11 DIAGNOSIS — I864 Gastric varices: Secondary | ICD-10-CM

## 2016-04-11 DIAGNOSIS — I482 Chronic atrial fibrillation, unspecified: Secondary | ICD-10-CM

## 2016-04-11 DIAGNOSIS — Z79899 Other long term (current) drug therapy: Secondary | ICD-10-CM | POA: Diagnosis not present

## 2016-04-11 DIAGNOSIS — I10 Essential (primary) hypertension: Secondary | ICD-10-CM

## 2016-04-11 DIAGNOSIS — K7469 Other cirrhosis of liver: Secondary | ICD-10-CM

## 2016-04-11 MED ORDER — BUMETANIDE 2 MG PO TABS: ORAL_TABLET | ORAL | 11 refills | Status: DC

## 2016-04-11 NOTE — Patient Instructions (Signed)
Medication Instructions: Dr Oval Linsey has recommended making the following medication changes: 1. INCREASE Bumex >>Take an additional half tablet twice daily as needed for weight gain greater than 2 pounds in 1 day or 5 pounds in 1 week.  Labwork: Your physician has ordered lab work. Please have this completed with you primary care physician.  Testing/Procedures: NONE ORDERED  Follow-up: Dr Oval Linsey recommends that you schedule a follow-up appointment in 2 months.  If you need a refill on your cardiac medications before your next appointment, please call your pharmacy.

## 2016-04-21 NOTE — H&P (Signed)
60yo PM female who presents for hysteroscopy, D&C, myosure polypectomy due to PMB and uterine polyp.  In review, she had noted bleeding back in May that lasted for about two weeks with bright red bleeding that required about a pantyline per day. An [place] was performed on 5/31 that showed 9x4.7x5.7cm uterus with 43m endometrial thickening. 2.4 subserosal fibriod anterior LUS. Normal ovaries bilaterally. Follow up exam with EMB showed polypoid fragment, no malignancy.  Due to uterine polyp, plan to proceed with surgical management. She has not had any recent episodes of vaginal bleeding. Denies pelvic or abdominal pain. No vaginal discharge, itching or irritation.  Current Medications  Taking   Co Q 10 100 mg Capsule 1 tablet Orally daily   Multivitamin . Tablet 1 tablet by mouth Once daily   Vitamin D3 2000 UNIT Capsule 1 capsule Orally Once a day   Spironolactone 25 MG Tablet 1 tablet Orally once a day   Potassium Chloride Crys ER 20 MEQ Tablet Extended Release 2 tabs Orally take 40 mg twice a day   Cyanocobalamin 1000 MCG Tablet 1 tablet Orally Once a day   Cardizem CD(DilTIAZem HCl CR) 240 MG Capsule Extended Release 24 Hour 1 capsule Orally Once a day   Ferrous Sulfate 325 (65 Fe) MG Tablet 1 tablet Orally Once a day   Propranolol HCl 10 MG Tablet 1 tablet Orally Twice a day   Bumex(Bumetanide) 2 MG Tablet 1 tablet Orally twice a day   Robaxin(Methocarbamol) 500 MG Tablet 1 1/2 tablet Orally daily as needed for muscle spasms   One Touch Ultra Lancets Lancets Use to test your blood sugar finger stick twice a day (Dx: R73.03)   OneTouch Ultra Test(Blood Glucose Test) - Strip Use to test your blood sugar finger stick twice a day (Dx: R73.03)   Lancets Micro Thin 33G - Miscellaneous as directed twice a day, before breakfast and after meals   One Touch Ultra test strips test strips as directed Dx - E11.9 as directed, twice a day   Glimepiride 2 MG Tablet 1 tablet with breakfast or the first main meal  of the day Orally Once a day   Medication List reviewed and reconciled with the patient    Past Medical History:  ITP- followed by hematology  hypercholesterolemia--cannot take statins, niacin, or Zetia.   hypertension--controlled without BP medication.   diabetes, type II--controlled without diabetes Rx.   sleep apnea (Dr. CKeturah Barre.   obesity (normal TSH 2008).   Depression/anxiety.   Possible asthma/allergies.   Liver cirrhosis , with portal HTN and Splenomegaly on CT 07/2013, EGD- 12/14Isolated gastric varices and portal hypertensive gastropathy seen..Marland Kitchen  Splenomegaly.   Fibromyalgia  Obstructive sleep apnea.   Pancytopenia.   A-flutter admssion 3/17, paroxysmal afib, chronic diastolic heart failure.    Surgical History  colon resection 01/2010  Rt. knee surgery 2010  cholecystectomy   C-Section   knee surgery, left 2012  colonoscopy   EGD    Family History  Father: deceased 437yrs, lung cancer, smoker, diagnosed with DM, Lung Ca  Mother: deceased 729yrs, CAD dxd age 21366 CABG, pacer, type II diabetes, onset age 21341 hypercholesterolemia, stroke, diagnosed with DM, CVA  Brother 1: alive, A + W  Brother2: alive, A + W  3 brother(s) . 1 son(s) , 1 daughter(s) .   Negative GI family hx, denies any GYN family cancer hx - except ? breast cancer in a cousin.   Social History  General:  Tobacco use  cigarettes: Former smoker Quit in year 1977 Pack-year Hx: 4 Tobacco history last updated 03/16/2016 no EXPOSURE TO PASSIVE SMOKE.  no Alcohol.  no Caffeine, 1 serving weekly, , soda.  no Recreational drug use.  no Exercise, NONE.  Marital Status: Married.  Children: Boys, 1, girls, 1,.  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    Gyn History  Sexual activity currently sexually active.  Periods : postmenopausal.  Denies H/O LMP.  Denies H/O Birth control.  Last pap smear date 7.7.16 - WNL.  Last mammogram date 11.9.15.  Denies H/O Abnormal pap smear.   Denies H/O STD.    OB History  OB History G2P2.    Allergies  Crestor: myalgias: Side Effects  Niacin: hot flashes: Side Effects  Zetia: headache: Side Effects  PredniSONE: heart racing: Side Effects  Celebrex: slurred speech: Side Effects  Metformin HCl: 500 MG bid - hypoglycemia sxs: Side Effects  Lipitor: increased mm pain: Side Effects  NO STATINS: ??: Side Effects   Hospitalization/Major Diagnostic Procedure  Irregular heart 11/2015  staph cellulitis 1995  colon resection 01/2010  rectal bleeding, bloating and abd pain 07/2013  abd cellulitis x 2 days 01/2015  SOB, edema, retainin fluid with IV lasix 01/26/16   Review of Systems  CONSTITUTIONAL:  no Chills. no Fever. no Night sweats.  HEENT:  Blurrred vision no. no Double vision.  CARDIOLOGY:  no Chest pain.  UROLOGY:  no Urinary urgency. Urinary frequency yes, due to "fluid pill". no Urinary incontinence.  RESPIRATORY:  no Shortness of breath. no Cough.  GASTROENTEROLOGY:  no Abdominal pain. no Appetite change. no Change in bowel movements. Constipation yes.  FEMALE REPRODUCTIVE:  no Breast lumps or discharge. no Breast pain.  NEUROLOGY:  no Dizziness. no Headache. no Loss of consciousness.  PSYCHOLOGY:  no Anxiety. no Depression. no Confusion.  SKIN:  no Rash. no Hives.  HEMATOLOGY/LYMPH:  no Anemia. Easy bruising yes, pt has h/o ITP. no Fatigue. Using Blood Thinners no.     O: Examination in office  Wt 336, Wt change -15.2 lb, Ht 66, BMI 54.23, Pulse sitting 92, BP sitting 101/60.   Examination  General Examination: GENERAL APPEARANCE well developed, well nourished .  SKIN: warm and dry, no rashes .  NECK: supple, normal appearance .  LUNGS: clear to auscultation bilaterally, no wheezes, rhonchi, rales.  HEART: irregularly irregular rhythm.  ABDOMEN: morbidly obese, soft and non-tender.  FEMALE GENITOURINARY: deferred  MUSCULOSKELETAL no calf tenderness bilaterally .  EXTREMITIES: 1+ non-pitting  edema present- pt wearing hose.  PSYCH appropriate mood and affect .     CBC    Component Value Date/Time   WBC 4.5 04/08/2016 1441   WBC 4.6 01/27/2016 0324   RBC 3.80 04/08/2016 1441   RBC 3.72 (L) 01/27/2016 0324   HGB 11.7 04/08/2016 1441   HCT 34.1 (L) 04/08/2016 1441   PLT 33 (L) 04/08/2016 1441   MCV 89.7 04/08/2016 1441   MCH 30.8 04/08/2016 1441   MCH 30.1 01/27/2016 0324   MCHC 34.3 04/08/2016 1441   MCHC 32.9 01/27/2016 0324   RDW 15.0 (H) 04/08/2016 1441   LYMPHSABS 1.1 04/08/2016 1441   MONOABS 0.5 04/08/2016 1441   EOSABS 0.1 04/08/2016 1441   BASOSABS 0.0 04/08/2016 1441     A/P: 60yo PM female who presents for hysteroscopy, D&C, Myosure polypectomy due to uterine polyp  -NPO -LR @ 125cc/hr -SCDs to OR -Risk/benefit and indications reviewed with patient including risk of bleeding, infection, and injury with risk of  uterine perforation.  Pt aware and wishes to proceed. -ITP- platelets stable for patient, will plan to to have platelets on hold should bleeding loss be higher than anticipated  Janyth Pupa, DO [phone] (pager) [phone] (office)

## 2016-04-29 NOTE — Patient Instructions (Addendum)
Your procedure is scheduled on:  Friday, May 06, 2016  Enter through the Main Entrance of [facility] at:  9:00 AM  Pick up the phone at the desk and dial [phone].  Call this number if you have problems the morning of surgery: [phone].  Remember: Do NOT eat food or drink after:  Midnight Thursday  Take these medicines the morning of surgery with a SIP OF WATER:  Propranolol, Cardizem  Do NOT wear jewelry (body piercing), metal hair clips/bobby pins, make-up, or nail polish. Do NOT wear lotions, powders, or perfumes.  You may wear deodorant. Do NOT shave for 48 hours prior to surgery. Do NOT bring valuables to the hospital. Contacts, dentures, or bridgework may not be worn into surgery.  Have a responsible adult drive you home and stay with you for 24 hours after your procedure

## 2016-05-02 ENCOUNTER — Encounter (HOSPITAL_COMMUNITY)
Admission: RE | Admit: 2016-05-02 | Discharge: 2016-05-02 | Disposition: A | Discharge status: Home / Self Care | Payer: BC Managed Care – PPO | Source: Ambulatory Visit | Attending: Obstetrics & Gynecology | Admitting: Obstetrics & Gynecology

## 2016-05-02 ENCOUNTER — Encounter (HOSPITAL_COMMUNITY): Payer: Self-pay

## 2016-05-02 DIAGNOSIS — Z01818 Encounter for other preprocedural examination: Secondary | ICD-10-CM | POA: Diagnosis not present

## 2016-05-02 HISTORY — DX: Other specified soft tissue disorders: M79.89

## 2016-05-02 HISTORY — DX: Hypotension, unspecified: I95.9

## 2016-05-02 HISTORY — DX: Splenomegaly, not elsewhere classified: R16.1

## 2016-05-02 HISTORY — DX: Cardiac arrhythmia, unspecified: I49.9

## 2016-05-02 LAB — BASIC METABOLIC PANEL
ANION GAP: 7 (ref 5–15)
BUN: 20 mg/dL (ref 6–20)
CALCIUM: 9 mg/dL (ref 8.9–10.3)
CO2: 28 mmol/L (ref 22–32)
Chloride: 104 mmol/L (ref 101–111)
Creatinine, Ser: 0.95 mg/dL (ref 0.44–1.00)
GFR calc Af Amer: >60 mL/min (ref >60)
GLUCOSE: 154 mg/dL — AB (ref 65–99)
POTASSIUM: 3.4 mmol/L — AB (ref 3.5–5.1)
Sodium: 139 mmol/L (ref 135–145)

## 2016-05-02 LAB — CBC
HEMATOCRIT: 34 % — AB (ref 36.0–46.0)
HEMOGLOBIN: 11.8 g/dL — AB (ref 12.0–15.0)
MCH: 30.7 pg (ref 26.0–34.0)
MCHC: 34.7 g/dL (ref 30.0–36.0)
MCV: 88.5 fL (ref 78.0–100.0)
Platelets: 24 10*3/uL — CL (ref 150–400)
RBC: 3.84 MIL/uL — ABNORMAL LOW (ref 3.87–5.11)
RDW: 14.7 % (ref 11.5–15.5)
WBC: 4 10*3/uL (ref 4.0–10.5)

## 2016-05-02 NOTE — Pre-Procedure Instructions (Signed)
Dr. Jillyn Hidden made aware of [name]'s platelet levels today.  Informed [name] at Dr. Annie Main office about [name]'s low platelets today as well.

## 2016-05-02 NOTE — Pre-Procedure Instructions (Signed)
Dr. Lauretta Grill reviewed Hannah Khan's medical history.  I will verify with Dr. Nelda Marseille that she has communicated with Ms. Alberta's oncologist in reference to a plan for her ITP.

## 2016-05-02 NOTE — Pre-Procedure Instructions (Signed)
I spoke with Dr. Nelda Marseille via telephone, she requested an order be placed to prepare platelets to be administered if needed for day of surgery.  Order placed under sign and held orders.

## 2016-05-06 ENCOUNTER — Ambulatory Visit (HOSPITAL_COMMUNITY)
Admission: RE | Admit: 2016-05-06 | Discharge: 2016-05-06 | Disposition: A | Discharge status: Home / Self Care | Payer: BC Managed Care – PPO | Source: Ambulatory Visit | Attending: Obstetrics & Gynecology | Admitting: Obstetrics & Gynecology

## 2016-05-06 ENCOUNTER — Encounter (HOSPITAL_COMMUNITY)
Admission: RE | Disposition: A | Discharge status: Home / Self Care | Payer: Self-pay | Source: Ambulatory Visit | Attending: Obstetrics & Gynecology

## 2016-05-06 ENCOUNTER — Other Ambulatory Visit: Payer: BC Managed Care – PPO

## 2016-05-06 ENCOUNTER — Ambulatory Visit (HOSPITAL_COMMUNITY): Payer: BC Managed Care – PPO | Admitting: Anesthesiology

## 2016-05-06 ENCOUNTER — Encounter (HOSPITAL_COMMUNITY): Payer: Self-pay

## 2016-05-06 DIAGNOSIS — I480 Paroxysmal atrial fibrillation: Secondary | ICD-10-CM | POA: Diagnosis not present

## 2016-05-06 DIAGNOSIS — E7800 Pure hypercholesterolemia, unspecified: Secondary | ICD-10-CM | POA: Insufficient documentation

## 2016-05-06 DIAGNOSIS — D693 Immune thrombocytopenic purpura: Secondary | ICD-10-CM | POA: Diagnosis not present

## 2016-05-06 DIAGNOSIS — N950 Postmenopausal bleeding: Secondary | ICD-10-CM | POA: Insufficient documentation

## 2016-05-06 DIAGNOSIS — Z888 Allergy status to other drugs, medicaments and biological substances status: Secondary | ICD-10-CM | POA: Insufficient documentation

## 2016-05-06 DIAGNOSIS — Z79899 Other long term (current) drug therapy: Secondary | ICD-10-CM | POA: Diagnosis not present

## 2016-05-06 DIAGNOSIS — I4892 Unspecified atrial flutter: Secondary | ICD-10-CM | POA: Diagnosis not present

## 2016-05-06 DIAGNOSIS — Z9049 Acquired absence of other specified parts of digestive tract: Secondary | ICD-10-CM | POA: Diagnosis not present

## 2016-05-06 DIAGNOSIS — E669 Obesity, unspecified: Secondary | ICD-10-CM | POA: Diagnosis not present

## 2016-05-06 DIAGNOSIS — F419 Anxiety disorder, unspecified: Secondary | ICD-10-CM | POA: Diagnosis not present

## 2016-05-06 DIAGNOSIS — M797 Fibromyalgia: Secondary | ICD-10-CM | POA: Diagnosis not present

## 2016-05-06 DIAGNOSIS — Z87891 Personal history of nicotine dependence: Secondary | ICD-10-CM | POA: Diagnosis not present

## 2016-05-06 DIAGNOSIS — F329 Major depressive disorder, single episode, unspecified: Secondary | ICD-10-CM | POA: Insufficient documentation

## 2016-05-06 DIAGNOSIS — N840 Polyp of corpus uteri: Secondary | ICD-10-CM | POA: Diagnosis not present

## 2016-05-06 DIAGNOSIS — I5032 Chronic diastolic (congestive) heart failure: Secondary | ICD-10-CM | POA: Insufficient documentation

## 2016-05-06 DIAGNOSIS — E119 Type 2 diabetes mellitus without complications: Secondary | ICD-10-CM | POA: Diagnosis not present

## 2016-05-06 DIAGNOSIS — G4733 Obstructive sleep apnea (adult) (pediatric): Secondary | ICD-10-CM | POA: Diagnosis not present

## 2016-05-06 DIAGNOSIS — I10 Essential (primary) hypertension: Secondary | ICD-10-CM | POA: Diagnosis not present

## 2016-05-06 HISTORY — PX: DILATATION & CURETTAGE/HYSTEROSCOPY WITH MYOSURE: SHX6511

## 2016-05-06 LAB — TYPE AND SCREEN
ABO/RH(D): O POS
ANTIBODY SCREEN: NEGATIVE

## 2016-05-06 LAB — GLUCOSE, CAPILLARY
GLUCOSE-CAPILLARY: 150 mg/dL — AB (ref 65–99)
Glucose-Capillary: 123 mg/dL — ABNORMAL HIGH (ref 65–99)

## 2016-05-06 LAB — ABO/RH: ABO/RH(D): O POS

## 2016-05-06 SURGERY — DILATATION & CURETTAGE/HYSTEROSCOPY WITH MYOSURE
Anesthesia: General

## 2016-05-06 MED ORDER — PROPOFOL 10 MG/ML IV BOLUS
INTRAVENOUS | Status: DC | PRN
  Administered 2016-05-06: 200 mg via INTRAVENOUS

## 2016-05-06 MED ORDER — LIDOCAINE HCL (CARDIAC) 20 MG/ML IV SOLN
INTRAVENOUS | Status: AC
  Filled 2016-05-06: qty 5

## 2016-05-06 MED ORDER — OXYCODONE HCL 5 MG/5ML PO SOLN: 5.0000 mg | Freq: Once | ORAL | Status: DC | PRN

## 2016-05-06 MED ORDER — LIDOCAINE-EPINEPHRINE 0.5 %-1:200000 IJ SOLN
INTRAMUSCULAR | Status: AC
  Filled 2016-05-06: qty 1

## 2016-05-06 MED ORDER — SUCCINYLCHOLINE CHLORIDE 20 MG/ML IJ SOLN
INTRAMUSCULAR | Status: AC
Start: 2016-05-06 — End: 2016-05-06
  Filled 2016-05-06: qty 1

## 2016-05-06 MED ORDER — PROPOFOL 10 MG/ML IV BOLUS
INTRAVENOUS | Status: AC
  Filled 2016-05-06: qty 20

## 2016-05-06 MED ORDER — SODIUM CHLORIDE 0.9 % IR SOLN
Status: DC | PRN
  Administered 2016-05-06: 1822 mL

## 2016-05-06 MED ORDER — SODIUM CHLORIDE 0.9 % IV SOLN: Freq: Once | INTRAVENOUS | Status: DC

## 2016-05-06 MED ORDER — ONDANSETRON HCL 4 MG/2ML IJ SOLN: 4.0000 mg | Freq: Once | INTRAMUSCULAR | Status: DC | PRN

## 2016-05-06 MED ORDER — FENTANYL CITRATE (PF) 100 MCG/2ML IJ SOLN
INTRAMUSCULAR | Status: DC | PRN
  Administered 2016-05-06 (×3): 50 ug via INTRAVENOUS

## 2016-05-06 MED ORDER — OXYCODONE HCL 5 MG PO TABS: 5.0000 mg | ORAL_TABLET | Freq: Once | ORAL | Status: DC | PRN

## 2016-05-06 MED ORDER — MIDAZOLAM HCL 2 MG/2ML IJ SOLN
INTRAMUSCULAR | Status: AC
  Filled 2016-05-06: qty 2

## 2016-05-06 MED ORDER — LIDOCAINE-EPINEPHRINE (PF) 1 %-1:200000 IJ SOLN
INTRAMUSCULAR | Status: AC
  Filled 2016-05-06: qty 30

## 2016-05-06 MED ORDER — HYDROMORPHONE HCL 1 MG/ML IJ SOLN: 0.2500 mg | INTRAMUSCULAR | Status: DC | PRN

## 2016-05-06 MED ORDER — FENTANYL CITRATE (PF) 100 MCG/2ML IJ SOLN
INTRAMUSCULAR | Status: AC
  Filled 2016-05-06: qty 2

## 2016-05-06 MED ORDER — LIDOCAINE HCL (CARDIAC) 20 MG/ML IV SOLN
INTRAVENOUS | Status: DC | PRN
  Administered 2016-05-06: 100 mg via INTRAVENOUS

## 2016-05-06 MED ORDER — LIDOCAINE-EPINEPHRINE (PF) 1 %-1:200000 IJ SOLN
INTRAMUSCULAR | Status: DC | PRN
  Administered 2016-05-06: 10 mL

## 2016-05-06 MED ORDER — MIDAZOLAM HCL 2 MG/2ML IJ SOLN
INTRAMUSCULAR | Status: DC | PRN
  Administered 2016-05-06: 2 mg via INTRAVENOUS

## 2016-05-06 MED ORDER — SUCCINYLCHOLINE CHLORIDE 20 MG/ML IJ SOLN
INTRAMUSCULAR | Status: DC | PRN
  Administered 2016-05-06: 120 mg via INTRAVENOUS

## 2016-05-06 MED ORDER — LACTATED RINGERS IV SOLN
INTRAVENOUS | Status: DC
  Administered 2016-05-06: 125 mL/h via INTRAVENOUS
  Administered 2016-05-06: 10:00:00 via INTRAVENOUS

## 2016-05-06 MED ORDER — ONDANSETRON HCL 4 MG/2ML IJ SOLN
INTRAMUSCULAR | Status: AC
  Filled 2016-05-06: qty 2

## 2016-05-06 MED ORDER — SCOPOLAMINE 1 MG/3DAYS TD PT72
1.0000 | MEDICATED_PATCH | Freq: Once | TRANSDERMAL | Status: DC
  Administered 2016-05-06: 1.5 mg via TRANSDERMAL

## 2016-05-06 MED ORDER — SCOPOLAMINE 1 MG/3DAYS TD PT72
MEDICATED_PATCH | TRANSDERMAL | Status: AC
  Filled 2016-05-06: qty 1

## 2016-05-06 MED ORDER — METOCLOPRAMIDE HCL 5 MG/ML IJ SOLN
INTRAMUSCULAR | Status: AC
  Filled 2016-05-06: qty 2

## 2016-05-06 MED ORDER — ONDANSETRON HCL 4 MG/2ML IJ SOLN
INTRAMUSCULAR | Status: DC | PRN
  Administered 2016-05-06: 4 mg via INTRAVENOUS

## 2016-05-06 MED ORDER — METOCLOPRAMIDE HCL 5 MG/ML IJ SOLN
INTRAMUSCULAR | Status: DC | PRN
  Administered 2016-05-06: 10 mg via INTRAVENOUS

## 2016-05-06 SURGICAL SUPPLY — 17 items
CANISTERS HI-FLOW 3000CC (CANNISTER) ×2 IMPLANT
CATH ROBINSON RED A/P 16FR (CATHETERS) ×2 IMPLANT
CLOTH BEACON ORANGE TIMEOUT ST (SAFETY) ×2 IMPLANT
CONTAINER PREFILL 10% NBF 60ML (FORM) ×4 IMPLANT
DEVICE MYOSURE LITE (MISCELLANEOUS) IMPLANT
DEVICE MYOSURE REACH (MISCELLANEOUS) ×1 IMPLANT
DILATOR CANAL MILEX (MISCELLANEOUS) IMPLANT
GLOVE BIOGEL PI IND STRL 6.5 (GLOVE) ×2 IMPLANT
GLOVE BIOGEL PI IND STRL 7.0 (GLOVE) ×1 IMPLANT
GLOVE BIOGEL PI INDICATOR 6.5 (GLOVE) ×2
GLOVE BIOGEL PI INDICATOR 7.0 (GLOVE) ×1
GLOVE ECLIPSE 6.5 STRL STRAW (GLOVE) ×2 IMPLANT
GOWN STRL REUS W/TWL LRG LVL3 (GOWN DISPOSABLE) ×4 IMPLANT
PACK VAGINAL MINOR WOMEN LF (CUSTOM PROCEDURE TRAY) ×2 IMPLANT
PAD OB MATERNITY 4.3X12.25 (PERSONAL CARE ITEMS) ×2 IMPLANT
SEAL ROD LENS SCOPE MYOSURE (ABLATOR) ×2 IMPLANT
TOWEL OR 17X24 6PK STRL BLUE (TOWEL DISPOSABLE) ×4 IMPLANT

## 2016-05-06 NOTE — Anesthesia Postprocedure Evaluation (Signed)
Anesthesia Post Note  Patient: Hannah Khan  Procedure(s) Performed: Procedure(s) (LRB): DILATATION & CURETTAGE/HYSTEROSCOPY WITH MYOSURE WITH POLYPECTOMY (N/A)  Patient location during evaluation: PACU Anesthesia Type: General Level of consciousness: awake and alert Pain management: pain level controlled Vital Signs Assessment: post-procedure vital signs reviewed and stable Respiratory status: spontaneous breathing, nonlabored ventilation, respiratory function stable and patient connected to nasal cannula oxygen Cardiovascular status: blood pressure returned to baseline and stable Postop Assessment: no signs of nausea or vomiting Anesthetic complications: no     Last Vitals:  Vitals:   05/06/16 1215 05/06/16 1314  BP: 109/61 (!) 130/54  Pulse: 81 81  Resp: 17 20  Temp:  36.6 C    Last Pain:  Vitals:   05/06/16 0821  TempSrc: Oral   Pain Goal: Patients Stated Pain Goal: 5 (05/06/16 1215)               Denario Bagot JENNETTE

## 2016-05-06 NOTE — Progress Notes (Deleted)
Operative Report  PreOp: 1) Postmenopausal bleeding 2) Uterine polyp 3) Obesity 4) ITP PostOp: same Procedure:  Hysteroscopy, Myosure polypectomy Surgeon: Dr. Janyth Pupa Anesthesia: General Complications:none EBL: 03TW UOP: 40cc IVF:1400cc Discrepancy: 200cc  Findings: 8wk sized anteverted uterus, both ostia visualized, thickened endometrium appreciated, ~ 1cm polyp  Specimens: 1) endometrial curettings  Procedure: The patient was taken to the operating room where she underwent general anesthesia without difficulty. The patient was placed in a low lithotomy position using Allen stirrups. The patient was examined with the findings as noted above.  She was then prepped and draped in the normal sterile fashion. The bladder was drained using a red rubber urethral catheter. A sterile speculum was inserted into the vagina. A single tooth tenaculum was placed on the anterior lip of the cervix. The uterus was then sounded to 8cm. The endocervical canal was then serially dilated to 14French using Hank dilators.  The diagnostic hysteroscope was then inserted without difficulty and noted to have the findings as listed above. Visualization was achieved using NS as a distending medium. The myosure was then used and resection was performed.  All instrument were then removed. Hemostasis was observed at the cervical site.  The patient was repositioned to the supine position. The patient tolerated the procedure without any complications and taken to recovery in stable condition.   Janyth Pupa, DO [phone] (pager) [phone] (office)

## 2016-05-06 NOTE — Anesthesia Procedure Notes (Signed)
Procedure Name: Intubation Date/Time: 05/06/2016 10:30 AM Performed by: Jonna Munro Pre-anesthesia Checklist: Patient identified, Emergency Drugs available, Patient being monitored, Suction available and Timeout performed Patient Re-evaluated:Patient Re-evaluated prior to inductionOxygen Delivery Method: Circle system utilized Preoxygenation: Pre-oxygenation with 100% oxygen Intubation Type: IV induction Ventilation: Mask ventilation without difficulty Laryngoscope Size: Mac and 4 Grade View: Grade I Tube type: Oral Tube size: 7.0 mm Number of attempts: 1 Airway Equipment and Method: Patient positioned with wedge pillow and Stylet Placement Confirmation: ETT inserted through vocal cords under direct vision,  positive ETCO2 and breath sounds checked- equal and bilateral Secured at: 22 cm Tube secured with: Tape Dental Injury: Teeth and Oropharynx as per pre-operative assessment

## 2016-05-06 NOTE — Anesthesia Preprocedure Evaluation (Signed)
Anesthesia Evaluation  Patient identified by MRN, date of birth, ID band Patient awake    Reviewed: Allergy & Precautions, NPO status , Patient's Chart, lab work & pertinent test results  Airway Mallampati: II  TM Distance: >3 FB Neck ROM: Full    Dental  (+) Teeth Intact, Dental Advisory Given   Pulmonary former smoker,    breath sounds clear to auscultation       Cardiovascular hypertension,  Rhythm:Regular Rate:Normal     Neuro/Psych    GI/Hepatic   Endo/Other  diabetes  Renal/GU      Musculoskeletal   Abdominal (+) + obese,   Peds  Hematology   Anesthesia Other Findings   Reproductive/Obstetrics                             Anesthesia Physical Anesthesia Plan  ASA: III  Anesthesia Plan: General   Post-op Pain Management:    Induction: Intravenous  Airway Management Planned: Oral ETT  Additional Equipment:   Intra-op Plan:   Post-operative Plan: Extubation in OR  Informed Consent: I have reviewed the patients History and Physical, chart, labs and discussed the procedure including the risks, benefits and alternatives for the proposed anesthesia with the patient or authorized representative who has indicated his/her understanding and acceptance.   Dental advisory given  Plan Discussed with: CRNA and Anesthesiologist  Anesthesia Plan Comments:         Anesthesia Quick Evaluation

## 2016-05-06 NOTE — Discharge Instructions (Signed)
HOME INSTRUCTIONS  Please note any unusual or excessive bleeding, pain, swelling. Mild dizziness or drowsiness are normal for about 24 hours after surgery.   Shower when comfortable  Restrictions: No driving for 24 hours or while taking pain medications.  Activity:  No heavy lifting (> 10 lbs), nothing in vagina (no tampons, douching, or intercourse) x 2 weeks; no tub baths for 2 weeks Vaginal spotting is expected but if your bleeding is heavy, period like,  please call the office    Diet:  You may return to your regular diet.  Do not eat large meals.  Eat small frequent meals throughout the day.  Continue to drink a good amount of water at least 6-8 glasses of water per day, hydration is very important for the healing process.  Pain Management: You may take over the counter medication as needed.     Alcohol -- Avoid for 24 hours and while taking pain medications.  Nausea: Take sips of ginger ale or soda  Fever -- Call physician if temperature over 101 degrees  Follow up:  If you do not already have a follow up appointment scheduled, please call the office at [phone].  If you experience fever (a temperature greater than 100.4), pain unrelieved by pain medication, shortness of breath, swelling of a single leg, or any other symptoms which are concerning to you please the office immediately.    DISCHARGE INSTRUCTIONS: HYSTEROSCOPY  The following instructions have been prepared to help you care for yourself upon your return home.  May Remove Scop patch on or before    Personal hygiene:  Use sanitary pads for vaginal drainage, not tampons.  Shower the day after your procedure.  NO tub baths, pools or Jacuzzis for 2-3 weeks.  Wipe front to back after using the bathroom.  Activity and limitations:  Do NOT drive or operate any equipment for 24 hours. The effects of anesthesia are still present and drowsiness may result.  Do NOT rest in bed all day.  Walking is  encouraged.  Walk up and down stairs slowly.  You may resume your normal activity in one to two days or as indicated by your physician. Sexual activity: NO intercourse for at least 2 weeks after the procedure, or as indicated by your Doctor.  Diet: Eat a light meal as desired this evening. You may resume your usual diet tomorrow.  Return to Work: You may resume your work activities in one to two days or as indicated by Marine scientist.  What to expect after your surgery: Expect to have vaginal bleeding/discharge for 2-3 days and spotting for up to 10 days. It is not unusual to have soreness for up to 1-2 weeks. You may have a slight burning sensation when you urinate for the first day. Mild cramps may continue for a couple of days. You may have a regular period in 2-6 weeks.  Call your doctor for any of the following:  Excessive vaginal bleeding or clotting, saturating and changing one pad every hour.  Inability to urinate 6 hours after discharge from hospital.  Pain not relieved by pain medication.  Fever of 100.4 F or greater.  Unusual vaginal discharge or odor.  Return to office _________________Call for an appointment ___________________ Patients signature: ______________________ Nurses signature ________________________  [facility] [phone]

## 2016-05-06 NOTE — Interval H&P Note (Signed)
History and Physical Interval Note:  05/06/2016 10:10 AM  Hannah Khan  has presented today for surgery, with the diagnosis of  Uterine Polyp POST MENOPAUSAL BLEEDING  The various methods of treatment have been discussed with the patient and family. After consideration of risks, benefits and other options for treatment, the patient has consented to  Procedure(s): DILATATION & CURETTAGE/HYSTEROSCOPY WITH MYOSURE WITH POLYPECTOMY (N/A) as a surgical intervention .  [name] history has been reviewed, patient examined, no change in status, stable for surgery.  I have reviewed the patient's chart and labs.  Questions were answered to the patient's satisfaction.     Janyth Pupa, M

## 2016-05-06 NOTE — Transfer of Care (Signed)
Immediate Anesthesia Transfer of Care Note  Patient: Hannah Khan  Procedure(s) Performed: Procedure(s): DILATATION & CURETTAGE/HYSTEROSCOPY WITH MYOSURE WITH POLYPECTOMY (N/A)  Patient Location: PACU  Anesthesia Type:General  Level of Consciousness: awake, alert  and oriented  Airway & Oxygen Therapy: Patient Spontanous Breathing and Patient connected to face mask oxygen  Post-op Assessment: Report given to RN and Post -op Vital signs reviewed and stable  Post vital signs: Reviewed and stable  Last Vitals:  Vitals:   05/06/16 0821  BP: 108/67  Pulse: 89  Resp: 14  Temp: 36.5 C    Last Pain:  Vitals:   05/06/16 0821  TempSrc: Oral      Patients Stated Pain Goal: 5 (56/72/09 1980)  Complications: No apparent anesthesia complications

## 2016-05-07 LAB — PREPARE PLATELET PHERESIS: Unit division: 0

## 2016-05-09 ENCOUNTER — Encounter (HOSPITAL_COMMUNITY): Payer: Self-pay | Admitting: Obstetrics & Gynecology

## 2016-05-09 NOTE — Op Note (Signed)
Operative Report  PreOp: 1) Postmenopausal bleeding 2) Uterine polyp 3) Obesity 4) ITP PostOp: same Procedure:  Hysteroscopy, Myosure polypectomy Surgeon: Dr. Janyth Pupa Anesthesia: General Complications:none EBL: 94MH UOP: 40cc IVF:1400cc Discrepancy: 200cc  Findings: 8wk sized anteverted uterus, both ostia visualized, thickened endometrium appreciated, ~ 1cm polyp  Specimens: 1) endometrial curettings  Procedure: The patient was taken to the operating room where she underwent general anesthesia without difficulty. The patient was placed in a low lithotomy position using Allen stirrups. The patient was examined with the findings as noted above.  She was then prepped and draped in the normal sterile fashion. The bladder was drained using a red rubber urethral catheter. A sterile speculum was inserted into the vagina. A single tooth tenaculum was placed on the anterior lip of the cervix. The uterus was then sounded to 8cm. The endocervical canal was then serially dilated to 14French using Hank dilators.  The diagnostic hysteroscope was then inserted without difficulty and noted to have the findings as listed above. Visualization was achieved using NS as a distending medium. The myosure was then used and resection was performed.  All instrument were then removed. Hemostasis was observed at the cervical site.  The patient was repositioned to the supine position. The patient tolerated the procedure without any complications and taken to recovery in stable condition.   Janyth Pupa, DO [phone] (pager) [phone] (office)

## 2016-05-09 NOTE — Progress Notes (Signed)
This encounter was created in error - please disregard.

## 2016-05-20 ENCOUNTER — Ambulatory Visit
Admission: RE | Admit: 2016-05-20 | Discharge: 2016-05-20 | Disposition: A | Discharge status: Home / Self Care | Payer: BC Managed Care – PPO | Source: Ambulatory Visit | Attending: Gastroenterology | Admitting: Gastroenterology

## 2016-05-20 DIAGNOSIS — I864 Gastric varices: Secondary | ICD-10-CM

## 2016-06-05 NOTE — Progress Notes (Signed)
Cardiology Office Note   Date:  06/06/2016   ID:  Hannah Khan, DOB 09-Jan-1956, MRN [id number]  PCP:  Lujean Amel, MD  Cardiologist:   Skeet Latch, MD  Dr. Paulita Fujita: GI Dr. Alen Blew: Hematology  Chief Complaint  Patient presents with  . Follow-up    2 months; edema; feets ankles and legs     Patient ID:  Hannah Khan is a 60 y.o. female with hypertension, atrial fibrillation, diabetes mellitus type 2, hyperlipidemia, cryptogenic cirrhosis, thrombocytopenia, and morbid obesity who presents for follow-up.  Hannah Khan was admitted to the hospital 3/21 through 3/25 with atrial fibrillation with rapid ventricular response. Given her history of portal hypertension and cirrhosis she was not anticoagulated. She was started on diltiazem for rate control and continued on her home propranolol. Her rate did not stay consistently less than 100 bpm so she was started on digoxin as well.  Echo revealed normal systolic function and her thyroid function was normal.  Since discharge she has struggled with heart rate control, lower extremity edema, and shortness of breath.   She was admitted to the hospital 6/0-7/37 with diastolic heart failure.  She was diuresed with IV lasix and digoxin was discontinued due to hypokalemia.  She was seen in follow up 02/2016, at which time she complained of hypotension.  Her heart rate was well-controlled but she continued to feel poorly in AF.  She was referred to Dr. Rayann Heman for consideration of a Watchman device however, he felt that she would not be a good candidate, as she will only be able to be on antiplatelets for one month and she is unlikely to remain in sinus rhythm without significant weight loss.   Hannah Khan has been doing well lately. However, she does a lot of fluctuation in her weight. There are many days when her weight go up by more than 3 pounds in a day. She has not been adjusting her bumex this week. When her weight is up she notes that her  breathing is more labored and she has more lower extremity edema. She has not noted any orthopnea or PND, but does feel more short of breath walking across. She tries to limit her sodium intake but does not restrict to a certain number.  She notes that after eating [place] her weight goes up significantly.  Overall her heart rates have been well-controlled. Most rates ranging in the 70s to 90s. She does remain in atrial fibrillation. She has not been able to get much exercise because she is back in school. She does get some exercise when walking across the [employer]. She has not noted any chest pain or lightheadedness.   Past Medical History:  Diagnosis Date  . Atrial fibrillation ([place])   . Blood dyscrasia   . Diabetes mellitus without complication ([place])   . Dysrhythmia   . Fibromyalgia   . HTN (hypertension)   . Hx of colonic polyps    s/p partial colectomy  . Hyperlipidemia   . ITP (idiopathic thrombocytopenic purpura)   . Left leg swelling    wear compression hose  . Liver cirrhosis secondary to NASH (nonalcoholic steatohepatitis) dx nov 2014   had enlarged spleen also   . Low blood pressure   . Morbid obesity ([place])   . PONV (postoperative nausea and vomiting)    in past none recent  . Portal hypertension ([place])   . Shortness of breath dyspnea    history of with fluid overload  .  Sleep apnea    uses cpap setting of 14  . Sore on scalp    may bleed secondary to ITP  . Spleen enlarged     Past Surgical History:  Procedure Laterality Date  . CESAREAN SECTION  1989  . CHOLECYSTECTOMY  20 yrs ago  . COLECTOMY  2011  . DILATATION & CURETTAGE/HYSTEROSCOPY WITH MYOSURE N/A 05/06/2016   Procedure: DILATATION & CURETTAGE/HYSTEROSCOPY WITH MYOSURE WITH POLYPECTOMY;  Surgeon: Janyth Pupa, DO;  Location: [facility];  Service: Gynecology;  Laterality: N/A;  . ESOPHAGOGASTRODUODENOSCOPY (EGD) WITH PROPOFOL N/A 09/04/2013   Procedure: ESOPHAGOGASTRODUODENOSCOPY (EGD) WITH PROPOFOL;   Surgeon: [name], MD;  Location: [facility];  Service: Endoscopy;  Laterality: N/A;  . KNEE ARTHROSCOPY  yrs ago   bilateral, one done x 1, one done twice     Current Outpatient Prescriptions  Medication Sig Dispense Refill  . Blood Glucose Monitoring Suppl (ONE TOUCH ULTRA MINI) w/Device KIT See admin instructions.  0  . bumetanide (BUMEX) 2 MG tablet Take 2 tablets by mouth twice daily. Ok to take 2 tablets 3 times a day as needed for weight gain 420 tablet 3  . Cholecalciferol (VITAMIN D) 2000 UNITS tablet Take 2,000 Units by mouth daily.    . Coenzyme Q10 (CO Q 10) 100 MG CAPS Take 100 mg by mouth daily.    . cyanocobalamin 1000 MCG tablet Take 1,000 mcg by mouth daily.    Marland Kitchen diltiazem (CARDIZEM CD) 240 MG 24 hr capsule Take 1 capsule (240 mg total) by mouth daily. 30 capsule 5  . Ferrous Sulfate (IRON) 325 (65 FE) MG TABS Take 1 tablet by mouth daily.    Marland Kitchen glimepiride (AMARYL) 2 MG tablet Take 1 tablet by mouth daily with breakfast.    . methocarbamol (ROBAXIN) 500 MG tablet Take 250-750 mg by mouth daily as needed for muscle spasms.     . Multiple Vitamins-Minerals (MULTIVITAMIN PO) Take 1 tablet by mouth daily.    . ONE TOUCH ULTRA TEST test strip as directed.  0  . ONETOUCH DELICA LANCETS 77L MISC as directed.  0  . Potassium Chloride ER 20 MEQ TBCR TAKE 2 TABLETS BY MOUTH TWICE A DAY 360 tablet 3  . propranolol (INDERAL) 10 MG tablet Take 1 tablet (10 mg total) by mouth 2 (two) times daily. 180 tablet 3  . spironolactone (ALDACTONE) 25 MG tablet Take 25 mg by mouth daily.      No current facility-administered medications for this visit.     Allergies:   Celecoxib; Prednisone; Shellfish allergy; Statins; Ibuprofen; and Tylenol [acetaminophen]    Social History:  The patient  reports that she quit smoking about 41 years ago. Her smoking use included Cigarettes. She has a 3.00 pack-year smoking history. She has never used smokeless tobacco. She reports that she drinks  about 0.6 oz of alcohol per week . She reports that she does not use drugs.   Family History:  [name] family history includes Cancer in her brother, father, and mother; Heart failure in her mother; Hyperlipidemia in her brother and mother; Lung cancer in her father; Stroke in her other.    ROS:  Please see the history of present illness.   Otherwise, review of systems are positive for none.   All other systems are reviewed and negative.    PHYSICAL EXAM: VS:  BP 135/80   Pulse 98   Ht 5' 7"  (1.702 m)   Wt (!) 340 lb 3.2 oz (154.3 kg)  BMI 53.28 kg/m  , BMI Body mass index is 53.28 kg/m. GENERAL:  Well appearing. Morbidly obese HEENT:  Pupils equal round and reactive, fundi not visualized, oral mucosa unremarkable NECK:  No jugular venous distention, waveform within normal limits, carotid upstroke brisk and symmetric, no bruits, no thyromegaly LYMPHATICS:  No cervical adenopathy LUNGS:  Clear to auscultation bilaterally HEART:  Irregularly irregular.  Distant heart sounds.  PMI not displaced or sustained,S1 and S2 within normal limits, no S3, no S4, no clicks, no rubs, II/VI systolic murmur at the RUSB ABD:  Flat, positive bowel sounds normal in frequency in pitch, no bruits, no rebound, no guarding, no midline pulsatile mass, no hepatomegaly, no splenomegaly EXT:  2 plus pulses throughout, 1+ pitting edema to the lower tibia bilaterally, no cyanosis no clubbing SKIN:  No rashes no nodules NEURO:  Cranial nerves II through XII grossly intact, motor grossly intact throughout PSYCH:  Cognitively intact, oriented to person place and time   EKG:  EKG is ordered today.  06/06/16: Atrial fibrillation. Rate 93 bpm. Low voltage.   Echo 12/10/15: Study Conclusions  - Procedure narrative: Transthoracic echocardiography. The study  was technically difficult, as a result of body habitus.  Intravenous contrast (Definity) was administered. - Left ventricle: The cavity size was normal.  Wall thickness was  increased in a pattern of mild LVH. Systolic function was normal.  The estimated ejection fraction was in the range of 55% to 60%.  Wall motion was normal; there were no regional wall motion  abnormalities. The study is not technically sufficient to allow  evaluation of LV diastolic function. - Mitral valve: Mildly thickened leaflets . There was trivial  regurgitation. - Left atrium: The atrium was mildly dilated. - Inferior vena cava: The vessel was dilated. The respirophasic  diameter changes were blunted (< 50%), consistent with elevated  central venous pressure. - Pericardium, extracardiac: A trivial pericardial effusion was  identified posterior to the heart.  Impressions:  - Technically difficult study. The patient was in atrial flutter  during the study. The LVEF is 55-60%, mild LVH, grossly normal  wall motion, trivial MR, mild LAE, dilated IVC, trivial posterior  pericardial effusion.  Recent Labs: 12/09/2015: Magnesium 1.7 01/26/2016: B Natriuretic Peptide 223.1 01/27/2016: ALT 27; TSH 2.932 05/02/2016: BUN 20; Creatinine, Ser 0.95; Hemoglobin 11.8; Platelets 24; Potassium 3.4; Sodium 139    Lipid Panel    Component Value Date/Time   CHOL 175 07/02/2014 1138   TRIG 93 07/02/2014 1138   HDL 59 07/02/2014 1138   CHOLHDL 3.0 07/02/2014 1138   VLDL 19 07/02/2014 1138   LDLCALC 97 07/02/2014 1138      Wt Readings from Last 3 Encounters:  06/06/16 (!) 340 lb 3.2 oz (154.3 kg)  05/02/16 (!) 339 lb 6 oz (153.9 kg)  04/11/16 (!) 341 lb (154.7 kg)     ASSESSMENT AND PLAN:  # Persistent atrial fibrillation: Ms. Wheeling is doing well and her heart rates are well-controlled.  She Is not a candidate for long-term anticoagulation due to cirrhosis.  This disqualifies her for the Watchman device and therefore we will not continue with a rhythm control strategy.  Continue diltiazem and propranolol. This patients CHA2DS2-VASc Score and unadjusted  Ischemic Stroke Rate (% per year) is equal to 3.2 % stroke rate/year from a score of 3 Above score calculated as 1 point each if present [CHF, HTN, DM, Vascular=MI/PAD/Aortic Plaque, Age if 65-74, or Female] Above score calculated as 2 points each if present [Age >  75, or Stroke/TIA/TE]  # Hypertension:  Blood pressure is normal here but low at home.  She is asympotmatic.  Continue propranolol and diltiazem.   # Chronic diastolic heart failure: Ms. Schnee continues to have significant lower extremity edema.  Bumex 66m bid and increase to tid when weight is up by 2lb in 1 day or 5lb in a week.  Continue spironolactone, propranolol and diltiazem.    Current medicines are reviewed at length with the patient today.  The patient does not have concerns regarding medicines.   The following changes have been made:  Increase bumex as above.   Labs/ tests ordered today include:   Orders Placed This Encounter  Procedures  . Basic metabolic panel  . EKG 12-Lead      Disposition:   FU with Enna Warwick C. ROval Linsey MD, [facility].   This note was written with the assistance of speech recognition software.  Please excuse any transcriptional errors.  Signed, Eriyonna Matsushita C. ROval Linsey MD, [facility] 06/06/2016 6:36 PM    [place]

## 2016-06-06 ENCOUNTER — Encounter: Payer: Self-pay | Admitting: Cardiovascular Disease

## 2016-06-06 ENCOUNTER — Ambulatory Visit (INDEPENDENT_AMBULATORY_CARE_PROVIDER_SITE_OTHER): Payer: BC Managed Care – PPO | Admitting: Cardiovascular Disease

## 2016-06-06 VITALS — BP 135/80 | HR 98 | Ht 67.0 in | Wt 340.2 lb

## 2016-06-06 DIAGNOSIS — I10 Essential (primary) hypertension: Secondary | ICD-10-CM | POA: Diagnosis not present

## 2016-06-06 DIAGNOSIS — I482 Chronic atrial fibrillation, unspecified: Secondary | ICD-10-CM

## 2016-06-06 DIAGNOSIS — K7469 Other cirrhosis of liver: Secondary | ICD-10-CM

## 2016-06-06 DIAGNOSIS — I5032 Chronic diastolic (congestive) heart failure: Secondary | ICD-10-CM

## 2016-06-06 MED ORDER — PROPRANOLOL HCL 10 MG PO TABS: 10.0000 mg | ORAL_TABLET | Freq: Two times a day (BID) | ORAL | 3 refills | Status: DC

## 2016-06-06 MED ORDER — BUMETANIDE 2 MG PO TABS: ORAL_TABLET | ORAL | 3 refills | Status: DC

## 2016-06-06 MED ORDER — POTASSIUM CHLORIDE ER 20 MEQ PO TBCR: EXTENDED_RELEASE_TABLET | ORAL | 3 refills | Status: DC

## 2016-06-06 NOTE — Patient Instructions (Addendum)
Medication Instructions:  INCREASE YOUR BUMETANIDE (BUMEX) TO 2 TABLETS TWICE A DAY. OK TO TAKE 2 TABLETS 3 TIMES A DAY AS NEEDED FOR WEIGHT GAIN OF 2 POUNDS OVER NIGHT OR 5 POUNDS IN A WEEK  INCREASE YOUR POTASSIUM TO 40 MEQ TWICE A DAY   Labwork: BMET AT [facility] ON THE FIRST FLOOR IN 1 TO 2 WEEKS  Testing/Procedures: NONE  Follow-Up: Your physician recommends that you schedule a follow-up appointment in: [place]  If you need a refill on your cardiac medications before your next appointment, please call your pharmacy.

## 2016-06-08 ENCOUNTER — Other Ambulatory Visit: Payer: Self-pay | Admitting: Cardiovascular Disease

## 2016-06-08 NOTE — Telephone Encounter (Signed)
Please review for refill. Thanks!  

## 2016-06-29 LAB — BASIC METABOLIC PANEL
BUN: 18 mg/dL (ref 7–25)
CALCIUM: 9.2 mg/dL (ref 8.6–10.4)
CO2: 28 mmol/L (ref 20–31)
CREATININE: 0.77 mg/dL (ref 0.50–0.99)
Chloride: 103 mmol/L (ref 98–110)
Glucose, Bld: 132 mg/dL — ABNORMAL HIGH (ref 65–99)
Potassium: 3.8 mmol/L (ref 3.5–5.3)
Sodium: 139 mmol/L (ref 135–146)

## 2016-07-08 ENCOUNTER — Ambulatory Visit (HOSPITAL_BASED_OUTPATIENT_CLINIC_OR_DEPARTMENT_OTHER): Payer: BC Managed Care – PPO | Admitting: Oncology

## 2016-07-08 ENCOUNTER — Other Ambulatory Visit (HOSPITAL_BASED_OUTPATIENT_CLINIC_OR_DEPARTMENT_OTHER): Payer: BC Managed Care – PPO

## 2016-07-08 VITALS — BP 128/64 | HR 95 | Temp 97.8°F | Resp 18 | Ht 67.0 in | Wt 352.7 lb

## 2016-07-08 DIAGNOSIS — D649 Anemia, unspecified: Secondary | ICD-10-CM | POA: Diagnosis not present

## 2016-07-08 DIAGNOSIS — D6959 Other secondary thrombocytopenia: Secondary | ICD-10-CM

## 2016-07-08 DIAGNOSIS — I4891 Unspecified atrial fibrillation: Secondary | ICD-10-CM

## 2016-07-08 DIAGNOSIS — D6949 Other primary thrombocytopenia: Secondary | ICD-10-CM

## 2016-07-08 DIAGNOSIS — D696 Thrombocytopenia, unspecified: Secondary | ICD-10-CM

## 2016-07-08 DIAGNOSIS — K7460 Unspecified cirrhosis of liver: Secondary | ICD-10-CM | POA: Diagnosis not present

## 2016-07-08 DIAGNOSIS — D508 Other iron deficiency anemias: Secondary | ICD-10-CM

## 2016-07-08 LAB — CBC WITH DIFFERENTIAL/PLATELET
BASO%: 0.6 % (ref 0.0–2.0)
BASOS ABS: 0 10*3/uL (ref 0.0–0.1)
EOS ABS: 0.1 10*3/uL (ref 0.0–0.5)
EOS%: 1.8 % (ref 0.0–7.0)
HEMATOCRIT: 32 % — AB (ref 34.8–46.6)
HEMOGLOBIN: 11 g/dL — AB (ref 11.6–15.9)
LYMPH%: 25.7 % (ref 14.0–49.7)
MCH: 30.8 pg (ref 25.1–34.0)
MCHC: 34.4 g/dL (ref 31.5–36.0)
MCV: 89.6 fL (ref 79.5–101.0)
MONO#: 0.3 10*3/uL (ref 0.1–0.9)
MONO%: 7.9 % (ref 0.0–14.0)
NEUT#: 2.2 10*3/uL (ref 1.5–6.5)
NEUT%: 64 % (ref 38.4–76.8)
PLATELETS: 28 10*3/uL — AB (ref 145–400)
RBC: 3.57 10*6/uL — ABNORMAL LOW (ref 3.70–5.45)
RDW: 14.4 % (ref 11.2–14.5)
WBC: 3.4 10*3/uL — ABNORMAL LOW (ref 3.9–10.3)
lymph#: 0.9 10*3/uL (ref 0.9–3.3)

## 2016-07-08 NOTE — Progress Notes (Signed)
Hematology and Oncology Follow Up Visit  Hannah Khan [id number] 29-Jun-1956 60 y.o. 07/08/2016 3:41 PM  CC: Hannah Silence, MD Hannah Khan, M.D. Hannah Ruff. Redmond Pulling, MD Hannah Khan, M.D.    Principle Diagnosis: This is a 60 year old female with the following issues:   1. Thrombocytopenia related to cirrhosis of the liver as well as platelet clumping. She is currently on observation and surveillance. 2.     Status post a partial colectomy due to a tubular adenoma, without any evidence of high-grade dysplasia or malignancy.   Current therapy: Observation, surveillance  Interim History: Hannah Khan presents today for a follow-up visit. Since her last visit, she reports no major changes in her health. She underwent hysteroscopy and polypectomy on 05/06/2016 without any bleeding complications. She continues to have issues with atrial fibrillation and not chronically anticoagulated because of thrombocytopenia. She is currently rate controlled with Cardizem and seems to be managing reasonably well. She remains reasonably active and perform activities of daily living.She denied any hemoptysis or hematemesis. She denied any other bleeding episodes.  She does not report any headaches, blurry vision, syncope or seizures. She has not reported any decline in her energy her performance status. She does not report any fevers or chills or sweats.She has not reported any chest pain or shortness of breath. She does not report any nausea, vomiting or abdominal pain. Does not report any cough or hemoptysis.rest of her review of systems unremarkable.  Medications: I have reviewed the patient's current medications.  Current Outpatient Prescriptions  Medication Sig Dispense Refill  . Blood Glucose Monitoring Suppl (ONE TOUCH ULTRA MINI) w/Device KIT See admin instructions.  0  . bumetanide (BUMEX) 2 MG tablet Take 2 tablets by mouth twice daily. Ok to take 2 tablets 3 times a day as needed for weight  gain 420 tablet 3  . Cholecalciferol (VITAMIN D) 2000 UNITS tablet Take 2,000 Units by mouth daily.    . Coenzyme Q10 (CO Q 10) 100 MG CAPS Take 100 mg by mouth daily.    . cyanocobalamin 1000 MCG tablet Take 1,000 mcg by mouth daily.    Marland Kitchen diltiazem (CARDIZEM CD) 240 MG 24 hr capsule TAKE 1 CAPSULE (240 MG TOTAL) BY MOUTH DAILY. 30 capsule 5  . Ferrous Sulfate (IRON) 325 (65 FE) MG TABS Take 1 tablet by mouth daily.    Marland Kitchen glimepiride (AMARYL) 2 MG tablet Take 1 tablet by mouth daily with breakfast.    . methocarbamol (ROBAXIN) 500 MG tablet Take 250-750 mg by mouth daily as needed for muscle spasms.     . Multiple Vitamins-Minerals (MULTIVITAMIN PO) Take 1 tablet by mouth daily.    . ONE TOUCH ULTRA TEST test strip as directed.  0  . ONETOUCH DELICA LANCETS 26Z MISC as directed.  0  . Potassium Chloride ER 20 MEQ TBCR TAKE 2 TABLETS BY MOUTH TWICE A DAY 360 tablet 3  . propranolol (INDERAL) 10 MG tablet Take 1 tablet (10 mg total) by mouth 2 (two) times daily. 180 tablet 3  . spironolactone (ALDACTONE) 25 MG tablet Take 25 mg by mouth daily.      No current facility-administered medications for this visit.     Allergies:  Allergies  Allergen Reactions  . Celecoxib Other (See Comments)    "speech slurred" with celebrex  . Prednisone Other (See Comments)    Rapid heart rate  . Shellfish Allergy Swelling    Lips tingle and swell. [place] the size of quarters.   Marland Kitchen  Statins Other (See Comments)    aggravated fibromyalgia  . Ibuprofen     Due to platelets  . Tylenol [Acetaminophen]     Due to platelets    Past Medical History, Surgical history, Social history, and Family History were reviewed and updated.   Physical Exam: Blood pressure 128/64, pulse 95, temperature 97.8 F (36.6 C), temperature source Oral, resp. rate 18, height 5' 7"  (1.702 m), weight (!) 352 lb 11.2 oz (160 kg), SpO2 98 %. ECOG: 1 General appearance: Alert and awake woman without distress. Head: Normocephalic,  without obvious abnormality no oral ulcers or thrush. Neck: no adenopathy Lymph nodes: Cervical, supraclavicular, and axillary nodes normal. Heart: Tachycardic without murmurs. Lung:chest clear, no wheezing, rales, normal symmetric air entry Abdomen: soft, non-tender, without masses or organomegaly no rebound or guarding. EXT:no erythema, induration, or nodules. Skin: No petechia noted. No ecchymosis.   Lab Results: Lab Results  Component Value Date   WBC 3.4 (L) 07/08/2016   HGB 11.0 (L) 07/08/2016   HCT 32.0 (L) 07/08/2016   MCV 89.6 07/08/2016   PLT 28 (L) 07/08/2016     Chemistry      Component Value Date/Time   NA 139 06/28/2016 1157   NA 142 08/01/2014 1509   K 3.8 06/28/2016 1157   K 4.1 08/01/2014 1509   CL 103 06/28/2016 1157   CL 107 08/03/2012 1449   CO2 28 06/28/2016 1157   CO2 23 08/01/2014 1509   BUN 18 06/28/2016 1157   BUN 16.9 08/01/2014 1509   CREATININE 0.77 06/28/2016 1157   CREATININE 0.7 08/01/2014 1509      Component Value Date/Time   CALCIUM 9.2 06/28/2016 1157   CALCIUM 9.3 08/01/2014 1509   ALKPHOS 84 01/27/2016 0324   ALKPHOS 82 08/01/2014 1509   AST 45 (H) 01/27/2016 0324   AST 54 (H) 08/01/2014 1509   ALT 27 01/27/2016 0324   ALT 30 08/01/2014 1509   BILITOT 3.0 (H) 01/27/2016 0324   BILITOT 1.71 (H) 08/01/2014 1509      This is a pleasant 60 year old female with the following issues:     1. Thrombocytopenia due to cirrhosis of the liver. Her platelet count Remains relatively stable without any bleeding episodes. The plan is to continue with observation and surveillance and consider platelet transfusion or Nplate if she develops any bleeding complications. 2. Atrial fibrillation: She continues to follow with cardiology regarding this issue. She is not a candidate for full dose anticoagulation given her bleeding risk from liver disease and thrombocytopenia. 3. Cirrhosis of the liver: Follow up with gastroenterology regarding this  issue. 4. Hysteroscopy: Completed without postoperative complications. 5. Mild anemia: Related to cirrhosis of the liver and we will evaluate her for iron deficiency and recheck her iron stores before the next visit. 6. Follow up: in 4 months scan sooner if needed to.    Hannah Khan 10/20/20173:41 PM

## 2016-07-10 ENCOUNTER — Emergency Department (HOSPITAL_COMMUNITY): Payer: BC Managed Care – PPO

## 2016-07-10 ENCOUNTER — Observation Stay (HOSPITAL_COMMUNITY)
Admission: EM | Admit: 2016-07-10 | Discharge: 2016-07-11 | Disposition: A | Discharge status: Home / Self Care | Payer: BC Managed Care – PPO | Attending: Internal Medicine | Admitting: Internal Medicine

## 2016-07-10 ENCOUNTER — Encounter (HOSPITAL_COMMUNITY): Payer: Self-pay

## 2016-07-10 DIAGNOSIS — D696 Thrombocytopenia, unspecified: Secondary | ICD-10-CM | POA: Diagnosis present

## 2016-07-10 DIAGNOSIS — I110 Hypertensive heart disease with heart failure: Secondary | ICD-10-CM | POA: Diagnosis not present

## 2016-07-10 DIAGNOSIS — R1011 Right upper quadrant pain: Principal | ICD-10-CM | POA: Insufficient documentation

## 2016-07-10 DIAGNOSIS — I4891 Unspecified atrial fibrillation: Secondary | ICD-10-CM | POA: Diagnosis not present

## 2016-07-10 DIAGNOSIS — K7460 Unspecified cirrhosis of liver: Secondary | ICD-10-CM | POA: Diagnosis not present

## 2016-07-10 DIAGNOSIS — I481 Persistent atrial fibrillation: Secondary | ICD-10-CM

## 2016-07-10 DIAGNOSIS — K766 Portal hypertension: Secondary | ICD-10-CM | POA: Insufficient documentation

## 2016-07-10 DIAGNOSIS — Z6841 Body Mass Index (BMI) 40.0 and over, adult: Secondary | ICD-10-CM | POA: Diagnosis not present

## 2016-07-10 DIAGNOSIS — Z87891 Personal history of nicotine dependence: Secondary | ICD-10-CM | POA: Insufficient documentation

## 2016-07-10 DIAGNOSIS — R109 Unspecified abdominal pain: Secondary | ICD-10-CM | POA: Diagnosis present

## 2016-07-10 DIAGNOSIS — I5032 Chronic diastolic (congestive) heart failure: Secondary | ICD-10-CM | POA: Diagnosis not present

## 2016-07-10 DIAGNOSIS — D693 Immune thrombocytopenic purpura: Secondary | ICD-10-CM | POA: Insufficient documentation

## 2016-07-10 DIAGNOSIS — Z7984 Long term (current) use of oral hypoglycemic drugs: Secondary | ICD-10-CM | POA: Insufficient documentation

## 2016-07-10 DIAGNOSIS — Z9049 Acquired absence of other specified parts of digestive tract: Secondary | ICD-10-CM | POA: Insufficient documentation

## 2016-07-10 DIAGNOSIS — D6949 Other primary thrombocytopenia: Secondary | ICD-10-CM | POA: Diagnosis present

## 2016-07-10 DIAGNOSIS — E119 Type 2 diabetes mellitus without complications: Secondary | ICD-10-CM | POA: Diagnosis not present

## 2016-07-10 DIAGNOSIS — K7581 Nonalcoholic steatohepatitis (NASH): Secondary | ICD-10-CM | POA: Diagnosis not present

## 2016-07-10 DIAGNOSIS — K7469 Other cirrhosis of liver: Secondary | ICD-10-CM | POA: Diagnosis present

## 2016-07-10 DIAGNOSIS — G4733 Obstructive sleep apnea (adult) (pediatric): Secondary | ICD-10-CM | POA: Diagnosis present

## 2016-07-10 DIAGNOSIS — Z79899 Other long term (current) drug therapy: Secondary | ICD-10-CM | POA: Diagnosis not present

## 2016-07-10 DIAGNOSIS — I10 Essential (primary) hypertension: Secondary | ICD-10-CM | POA: Diagnosis not present

## 2016-07-10 DIAGNOSIS — M7918 Myalgia, other site: Secondary | ICD-10-CM | POA: Diagnosis present

## 2016-07-10 DIAGNOSIS — I4821 Permanent atrial fibrillation: Secondary | ICD-10-CM | POA: Diagnosis present

## 2016-07-10 DIAGNOSIS — I5033 Acute on chronic diastolic (congestive) heart failure: Secondary | ICD-10-CM | POA: Diagnosis present

## 2016-07-10 DIAGNOSIS — R1031 Right lower quadrant pain: Secondary | ICD-10-CM

## 2016-07-10 LAB — CBC
HEMATOCRIT: 35.2 % — AB (ref 36.0–46.0)
HEMOGLOBIN: 11.9 g/dL — AB (ref 12.0–15.0)
MCH: 31 pg (ref 26.0–34.0)
MCHC: 33.8 g/dL (ref 30.0–36.0)
MCV: 91.7 fL (ref 78.0–100.0)
Platelets: 33 10*3/uL — ABNORMAL LOW (ref 150–400)
RBC: 3.84 MIL/uL — ABNORMAL LOW (ref 3.87–5.11)
RDW: 14.8 % (ref 11.5–15.5)
WBC: 8.4 10*3/uL (ref 4.0–10.5)

## 2016-07-10 LAB — COMPREHENSIVE METABOLIC PANEL
ALBUMIN: 3.9 g/dL (ref 3.5–5.0)
ALT: 24 U/L (ref 14–54)
ANION GAP: 8 (ref 5–15)
AST: 39 U/L (ref 15–41)
Alkaline Phosphatase: 79 U/L (ref 38–126)
BUN: 22 mg/dL — ABNORMAL HIGH (ref 6–20)
CHLORIDE: 102 mmol/L (ref 101–111)
CO2: 28 mmol/L (ref 22–32)
Calcium: 9.1 mg/dL (ref 8.9–10.3)
Creatinine, Ser: 0.92 mg/dL (ref 0.44–1.00)
GFR calc Af Amer: >60 mL/min (ref >60)
GFR calc non Af Amer: >60 mL/min (ref >60)
GLUCOSE: 156 mg/dL — AB (ref 65–99)
POTASSIUM: 3.5 mmol/L (ref 3.5–5.1)
Sodium: 138 mmol/L (ref 135–145)
TOTAL PROTEIN: 7.9 g/dL (ref 6.5–8.1)
Total Bilirubin: 2.5 mg/dL — ABNORMAL HIGH (ref 0.3–1.2)

## 2016-07-10 LAB — URINE MICROSCOPIC-ADD ON: WBC UA: NONE SEEN WBC/hpf (ref 0–5)

## 2016-07-10 LAB — URINALYSIS, ROUTINE W REFLEX MICROSCOPIC
Bilirubin Urine: NEGATIVE
Glucose, UA: NEGATIVE mg/dL
KETONES UR: NEGATIVE mg/dL
LEUKOCYTES UA: NEGATIVE
NITRITE: NEGATIVE
PROTEIN: NEGATIVE mg/dL
Specific Gravity, Urine: 1.03 (ref 1.005–1.030)
pH: 6 (ref 5.0–8.0)

## 2016-07-10 LAB — CBG MONITORING, ED: Glucose-Capillary: 144 mg/dL — ABNORMAL HIGH (ref 65–99)

## 2016-07-10 LAB — GLUCOSE, CAPILLARY
Glucose-Capillary: 181 mg/dL — ABNORMAL HIGH (ref 65–99)
Glucose-Capillary: 188 mg/dL — ABNORMAL HIGH (ref 65–99)

## 2016-07-10 LAB — LIPASE, BLOOD: LIPASE: 32 U/L (ref 11–51)

## 2016-07-10 LAB — I-STAT CG4 LACTIC ACID, ED: LACTIC ACID, VENOUS: 1.58 mmol/L (ref 0.5–1.9)

## 2016-07-10 MED ORDER — ONDANSETRON HCL 4 MG/2ML IJ SOLN: 4.0000 mg | Freq: Four times a day (QID) | INTRAMUSCULAR | Status: DC | PRN

## 2016-07-10 MED ORDER — OXYCODONE HCL 5 MG PO TABS
10.0000 mg | ORAL_TABLET | Freq: Once | ORAL | Status: AC
Start: 2016-07-10 — End: 2016-07-10
  Administered 2016-07-10: 10 mg via ORAL
  Filled 2016-07-10: qty 2

## 2016-07-10 MED ORDER — HYDROMORPHONE HCL 1 MG/ML IJ SOLN
1.0000 mg | Freq: Once | INTRAMUSCULAR | Status: AC
  Administered 2016-07-10: 1 mg via INTRAVENOUS
  Filled 2016-07-10: qty 1

## 2016-07-10 MED ORDER — METHOCARBAMOL 500 MG PO TABS: 250.0000 mg | ORAL_TABLET | Freq: Every day | ORAL | Status: DC | PRN

## 2016-07-10 MED ORDER — DILTIAZEM HCL ER COATED BEADS 240 MG PO CP24
240.0000 mg | ORAL_CAPSULE | Freq: Every day | ORAL | Status: DC
  Administered 2016-07-11: 240 mg via ORAL
  Filled 2016-07-10: qty 1

## 2016-07-10 MED ORDER — SODIUM CHLORIDE 0.9 % IV BOLUS (SEPSIS)
1000.0000 mL | Freq: Once | INTRAVENOUS | Status: AC
  Administered 2016-07-10: 1000 mL via INTRAVENOUS

## 2016-07-10 MED ORDER — FERROUS SULFATE 325 (65 FE) MG PO TABS
325.0000 mg | ORAL_TABLET | Freq: Every day | ORAL | Status: DC
  Administered 2016-07-11: 325 mg via ORAL
  Filled 2016-07-10 (×2): qty 1

## 2016-07-10 MED ORDER — SODIUM CHLORIDE 0.9% FLUSH: 3.0000 mL | INTRAVENOUS | Status: DC | PRN

## 2016-07-10 MED ORDER — OXYCODONE HCL 5 MG PO TABS
5.0000 mg | ORAL_TABLET | Freq: Four times a day (QID) | ORAL | Status: DC
  Administered 2016-07-10 – 2016-07-11 (×2): 5 mg via ORAL
  Filled 2016-07-10 (×2): qty 1

## 2016-07-10 MED ORDER — SODIUM CHLORIDE 0.9% FLUSH
3.0000 mL | Freq: Two times a day (BID) | INTRAVENOUS | Status: DC
  Administered 2016-07-10 – 2016-07-11 (×2): 3 mL via INTRAVENOUS

## 2016-07-10 MED ORDER — ONDANSETRON HCL 4 MG PO TABS: 4.0000 mg | ORAL_TABLET | Freq: Four times a day (QID) | ORAL | Status: DC | PRN

## 2016-07-10 MED ORDER — SPIRONOLACTONE 25 MG PO TABS
25.0000 mg | ORAL_TABLET | Freq: Every day | ORAL | Status: DC
  Administered 2016-07-10 – 2016-07-11 (×2): 25 mg via ORAL
  Filled 2016-07-10 (×2): qty 1

## 2016-07-10 MED ORDER — IOPAMIDOL (ISOVUE-300) INJECTION 61%
100.0000 mL | Freq: Once | INTRAVENOUS | Status: AC | PRN
  Administered 2016-07-10: 100 mL via INTRAVENOUS

## 2016-07-10 MED ORDER — OXYCODONE HCL 5 MG PO TABS: 10.0000 mg | ORAL_TABLET | Freq: Four times a day (QID) | ORAL | Status: DC

## 2016-07-10 MED ORDER — INSULIN ASPART 100 UNIT/ML ~~LOC~~ SOLN: 0.0000 [IU] | Freq: Every day | SUBCUTANEOUS | Status: DC

## 2016-07-10 MED ORDER — VITAMIN B-12 1000 MCG PO TABS
1000.0000 ug | ORAL_TABLET | Freq: Every day | ORAL | Status: DC
  Administered 2016-07-11: 1000 ug via ORAL
  Filled 2016-07-10: qty 1

## 2016-07-10 MED ORDER — METHOCARBAMOL 500 MG PO TABS
500.0000 mg | ORAL_TABLET | Freq: Four times a day (QID) | ORAL | Status: DC
  Administered 2016-07-10 – 2016-07-11 (×3): 500 mg via ORAL
  Filled 2016-07-10 (×3): qty 1

## 2016-07-10 MED ORDER — PROPRANOLOL HCL 10 MG PO TABS: 10.0000 mg | ORAL_TABLET | Freq: Two times a day (BID) | ORAL | Status: DC

## 2016-07-10 MED ORDER — HYDROMORPHONE HCL 1 MG/ML IJ SOLN: 1.0000 mg | INTRAMUSCULAR | Status: DC | PRN

## 2016-07-10 MED ORDER — ONDANSETRON HCL 4 MG/2ML IJ SOLN
4.0000 mg | Freq: Three times a day (TID) | INTRAMUSCULAR | Status: DC | PRN
  Administered 2016-07-10: 4 mg via INTRAVENOUS
  Filled 2016-07-10: qty 2

## 2016-07-10 MED ORDER — VITAMIN D3 25 MCG (1000 UNIT) PO TABS
2000.0000 [IU] | ORAL_TABLET | Freq: Every day | ORAL | Status: DC
  Administered 2016-07-11: 2000 [IU] via ORAL
  Filled 2016-07-10: qty 2

## 2016-07-10 MED ORDER — PROPRANOLOL HCL 20 MG PO TABS
20.0000 mg | ORAL_TABLET | Freq: Two times a day (BID) | ORAL | Status: DC
  Administered 2016-07-10 – 2016-07-11 (×2): 20 mg via ORAL
  Filled 2016-07-10 (×2): qty 2

## 2016-07-10 MED ORDER — ONDANSETRON HCL 4 MG/2ML IJ SOLN
4.0000 mg | Freq: Once | INTRAMUSCULAR | Status: AC
  Administered 2016-07-10: 4 mg via INTRAVENOUS
  Filled 2016-07-10: qty 2

## 2016-07-10 MED ORDER — DICLOFENAC SODIUM 1 % TD GEL
4.0000 g | Freq: Four times a day (QID) | TRANSDERMAL | Status: DC
  Administered 2016-07-10 – 2016-07-11 (×4): 4 g via TOPICAL
  Filled 2016-07-10: qty 100

## 2016-07-10 MED ORDER — BUMETANIDE 2 MG PO TABS
4.0000 mg | ORAL_TABLET | Freq: Two times a day (BID) | ORAL | Status: DC
  Administered 2016-07-10 – 2016-07-11 (×2): 4 mg via ORAL
  Filled 2016-07-10 (×3): qty 2

## 2016-07-10 MED ORDER — ONDANSETRON HCL 4 MG/2ML IJ SOLN
4.0000 mg | Freq: Four times a day (QID) | INTRAMUSCULAR | Status: DC | PRN
  Administered 2016-07-11: 4 mg via INTRAVENOUS
  Filled 2016-07-10: qty 2

## 2016-07-10 MED ORDER — SENNOSIDES-DOCUSATE SODIUM 8.6-50 MG PO TABS: 1.0000 | ORAL_TABLET | Freq: Every evening | ORAL | Status: DC | PRN

## 2016-07-10 MED ORDER — INSULIN ASPART 100 UNIT/ML ~~LOC~~ SOLN
0.0000 [IU] | Freq: Three times a day (TID) | SUBCUTANEOUS | Status: DC
  Administered 2016-07-10: 3 [IU] via SUBCUTANEOUS
  Administered 2016-07-11: 2 [IU] via SUBCUTANEOUS
  Administered 2016-07-11: 3 [IU] via SUBCUTANEOUS

## 2016-07-10 MED ORDER — SODIUM CHLORIDE 0.9 % IV SOLN: 250.0000 mL | INTRAVENOUS | Status: DC | PRN

## 2016-07-10 NOTE — ED Notes (Signed)
Hospitalist at bedside 

## 2016-07-10 NOTE — ED Notes (Signed)
Requested patient to urinate.

## 2016-07-10 NOTE — ED Notes (Signed)
Pt placed on 2L of O2 for a O2 sat of 85% while resting. Pt states she normally uses a CPAP machine to sleep

## 2016-07-10 NOTE — H&P (Signed)
History and Physical    Hannah Khan PRF:[id number] DOB: 1956-02-08 DOA: 07/10/2016    PCP: Lujean Amel, MD  Patient coming from: home  Chief Complaint: abdominal pain  HPI: Hannah Khan is a 60 y.o. female with medical history significant of A-fib, Cirrhosis due to NASH with severe thrombocytopenia and portal HTN, d CHF, Fibromyalgia, HTN, Morbid obesity, OSA, NIRDM 2 who woke up this AM with right upper, mid abdominal pain. She had mild nausea but no vomiting. No fever, chills, diarrhea. No other complaints.   ED Course:  HR- A-fib in 110-130s depending on movement CT abd/ pelvis doesn't show an etiology for pain- she has cirrhosis, portal HTN with dilated portal vein, splenomegaly, gastric and esophageal varices, trace ascites, colonic diverticulosis, cardiomegaly, Ao atherosclerosis  Review of Systems:  Weight up and down due to fluid- is about 7 lb over weight due to having to skip her Lasix Headaches due to eye strain Easy bruising Occasional panic attacks if she is over stressed All other systems reviewed and apart from HPI, are negative.  Past Medical History:  Diagnosis Date  . Atrial fibrillation ([place])   . Blood dyscrasia   . Diabetes mellitus without complication ([place])   . Dysrhythmia   . Fibromyalgia   . HTN (hypertension)   . Hx of colonic polyps    s/p partial colectomy  . Hyperlipidemia   . Left leg swelling    wear compression hose  . Liver cirrhosis secondary to NASH (nonalcoholic steatohepatitis) ([place]) dx nov 2014   had enlarged spleen also   . Morbid obesity ([place])   . PONV (postoperative nausea and vomiting)    in past none recent  . Portal hypertension ([place])   . Sleep apnea    uses cpap setting of 14  . Spleen enlarged     Past Surgical History:  Procedure Laterality Date  . CESAREAN SECTION  1989  . CHOLECYSTECTOMY  20 yrs ago  . COLECTOMY  2011  . DILATATION & CURETTAGE/HYSTEROSCOPY WITH MYOSURE N/A 05/06/2016   Procedure: DILATATION  & CURETTAGE/HYSTEROSCOPY WITH MYOSURE WITH POLYPECTOMY;  Surgeon: Janyth Pupa, DO;  Location: [facility];  Service: Gynecology;  Laterality: N/A;  . ESOPHAGOGASTRODUODENOSCOPY (EGD) WITH PROPOFOL N/A 09/04/2013   Procedure: ESOPHAGOGASTRODUODENOSCOPY (EGD) WITH PROPOFOL;  Surgeon: [name], MD;  Location: [facility];  Service: Endoscopy;  Laterality: N/A;  . KNEE ARTHROSCOPY  yrs ago   bilateral, one done x 1, one done twice    Social History:   reports that she quit smoking about 41 years ago. Her smoking use included Cigarettes. She has a 3.00 pack-year smoking history. She has never used smokeless tobacco. She reports that she drinks about 0.6 oz of alcohol per week . She reports that she does not use drugs.  Live with husband, needs cane occasionally if fibromyalgia is bad   Allergies  Allergen Reactions  . Celecoxib Other (See Comments)    "speech slurred" with celebrex  . Prednisone Other (See Comments)    Rapid heart rate  . Shellfish Allergy Swelling    Lips tingle and swell. [place]ps the size of quarters.   . Statins Other (See Comments)    aggravated fibromyalgia  . Ibuprofen     Due to platelets  . Tylenol [Acetaminophen]     Due to platelets    Family History  Problem Relation Age of Onset  . Heart failure Mother   . Cancer Mother   . Hyperlipidemia Mother   . Lung cancer Father   .  Cancer Father   . Cancer Brother   . Hyperlipidemia Brother   . Stroke Other      Prior to Admission medications   Medication Sig Start Date End Date Taking? Authorizing Provider  bumetanide (BUMEX) 2 MG tablet Take 2 tablets by mouth twice daily. Ok to take 2 tablets 3 times a day as needed for weight gain Patient taking differently: Take 4 mg by mouth 2 (two) times daily.  06/06/16  Yes Skeet Latch, MD  Cholecalciferol (VITAMIN D) 2000 UNITS tablet Take 2,000 Units by mouth daily.   Yes Historical Provider, MD  Coenzyme Q10 (CO Q 10) 100 MG CAPS Take 100 mg by mouth  daily.   Yes Historical Provider, MD  cyanocobalamin 1000 MCG tablet Take 1,000 mcg by mouth daily.   Yes Historical Provider, MD  diltiazem (CARDIZEM CD) 240 MG 24 hr capsule TAKE 1 CAPSULE (240 MG TOTAL) BY MOUTH DAILY. 06/08/16  Yes Skeet Latch, MD  Ferrous Sulfate (IRON) 325 (65 FE) MG TABS Take 325 mg by mouth daily.    Yes Historical Provider, MD  glimepiride (AMARYL) 2 MG tablet Take 2 mg by mouth daily with breakfast.  03/11/16  Yes Historical Provider, MD  methocarbamol (ROBAXIN) 500 MG tablet Take 250-750 mg by mouth daily as needed for muscle spasms.    Yes Historical Provider, MD  Potassium Chloride ER 20 MEQ TBCR TAKE 2 TABLETS BY MOUTH TWICE A DAY Patient taking differently: Take 40 mEq by mouth 2 (two) times daily.  06/06/16  Yes Skeet Latch, MD  propranolol (INDERAL) 10 MG tablet Take 1 tablet (10 mg total) by mouth 2 (two) times daily. 06/06/16  Yes Skeet Latch, MD  spironolactone (ALDACTONE) 25 MG tablet Take 25 mg by mouth daily.  07/24/13  Yes Thurnell Lose, MD  Blood Glucose Monitoring Suppl (ONE TOUCH ULTRA MINI) w/Device KIT See admin instructions. 02/13/16   Historical Provider, MD  Multiple Vitamins-Minerals (MULTIVITAMIN PO) Take 1 tablet by mouth daily.    Historical Provider, MD  ONE TOUCH ULTRA TEST test strip as directed. 02/13/16   Historical Provider, MD  Jonetta Speak LANCETS 56D MISC as directed. 02/13/16   Historical Provider, MD    Physical Exam: Vitals:   07/10/16 1430 07/10/16 1515 07/10/16 1534 07/10/16 1600  BP: (!) 109/53  134/69 142/80  Pulse: 111 119 82 106  Resp: 20 15 14 10   Temp:      TempSrc:      SpO2: 93% 95% 90% 92%  Weight:      Height:          Constitutional: NAD, calm, comfortable Eyes: PERTLA, lids and conjunctivae normal ENMT: Mucous membranes are moist. Posterior pharynx clear of any exudate or lesions. Normal dentition.  Neck: normal, supple, no masses, no thyromegaly Respiratory: clear to auscultation  bilaterally, no wheezing, no crackles. Normal respiratory effort. No accessory muscle use.  Cardiovascular: S1 & S2 heard, regular rate and rhythm, no murmurs / rubs / gallops. No extremity edema. 2+ pedal pulses. No carotid bruits.  Abdomen: No distension, no tenderness, no masses palpated. No hepatosplenomegaly. Bowel sounds normal.  Musculoskeletal: no clubbing / cyanosis. No joint deformity upper and lower extremities. Good ROM, no contractures. Normal muscle tone.  Skin: no rashes, lesions, ulcers. No induration Neurologic: CN 2-12 grossly intact. Sensation intact, DTR normal. Strength 5/5 in all 4 limbs.  Psychiatric: Normal judgment and insight. Alert and oriented x 3. Normal mood.     Labs on Admission: I have  personally reviewed following labs and imaging studies  CBC:  Recent Labs Lab 07/08/16 1456 07/10/16 1520  WBC 3.4* 8.4  NEUTROABS 2.2  --   HGB 11.0* 11.9*  HCT 32.0* 35.2*  MCV 89.6 91.7  PLT 28* 33*   Basic Metabolic Panel:  Recent Labs Lab 07/10/16 1202  NA 138  K 3.5  CL 102  CO2 28  GLUCOSE 156*  BUN 22*  CREATININE 0.92  CALCIUM 9.1   GFR: Estimated Creatinine Clearance: 101.6 mL/min (by C-G formula based on SCr of 0.92 mg/dL). Liver Function Tests:  Recent Labs Lab 07/10/16 1202  AST 39  ALT 24  ALKPHOS 79  BILITOT 2.5*  PROT 7.9  ALBUMIN 3.9    Recent Labs Lab 07/10/16 1202  LIPASE 32   No results for input(s): AMMONIA in the last 168 hours. Coagulation Profile: No results for input(s): INR, PROTIME in the last 168 hours. Cardiac Enzymes: No results for input(s): CKTOTAL, CKMB, CKMBINDEX, TROPONINI in the last 168 hours. BNP (last 3 results) No results for input(s): PROBNP in the last 8760 hours. HbA1C: No results for input(s): HGBA1C in the last 72 hours. CBG: No results for input(s): GLUCAP in the last 168 hours. Lipid Profile: No results for input(s): CHOL, HDL, LDLCALC, TRIG, CHOLHDL, LDLDIRECT in the last 72  hours. Thyroid Function Tests: No results for input(s): TSH, T4TOTAL, FREET4, T3FREE, THYROIDAB in the last 72 hours. Anemia Panel: No results for input(s): VITAMINB12, FOLATE, FERRITIN, TIBC, IRON, RETICCTPCT in the last 72 hours. Urine analysis:    Component Value Date/Time   COLORURINE AMBER (A) 07/10/2016 1400   APPEARANCEUR CLEAR 07/10/2016 1400   LABSPEC 1.030 07/10/2016 1400   PHURINE 6.0 07/10/2016 1400   GLUCOSEU NEGATIVE 07/10/2016 1400   HGBUR TRACE (A) 07/10/2016 1400   BILIRUBINUR NEGATIVE 07/10/2016 1400   KETONESUR NEGATIVE 07/10/2016 1400   PROTEINUR NEGATIVE 07/10/2016 1400   UROBILINOGEN 2.0 (H) 01/31/2015 0055   NITRITE NEGATIVE 07/10/2016 1400   LEUKOCYTESUR NEGATIVE 07/10/2016 1400   Sepsis Labs: @LABRCNTIP (procalcitonin:4,lacticidven:4) )No results found for this or any previous visit (from the past 240 hour(s)).   Radiological Exams on Admission: Ct Abdomen Pelvis W Contrast  Result Date: 07/10/2016 CLINICAL DATA:  60 year old female complaining of 10 out of 10 abdominal pain since this morning. Nausea. No emesis. EXAM: CT ABDOMEN AND PELVIS WITH CONTRAST TECHNIQUE: Multidetector CT imaging of the abdomen and pelvis was performed using the standard protocol following bolus administration of intravenous contrast. CONTRAST:  179m ISOVUE-300 IOPAMIDOL (ISOVUE-300) INJECTION 61% COMPARISON:  CT the abdomen and pelvis 01/29/2015. FINDINGS: Lower chest: Mild scarring in the visualize lung bases. Cardiomegaly. Large paraesophageal varices. Hepatobiliary: The liver has a shrunken appearance and nodular contour, compatible with underlying cirrhosis. Contrast bolus on today's examination is sub optimal. With these limitations in mind, there is no definite cystic or solid hepatic lesion. No intra or extrahepatic biliary ductal dilatation. Status post cholecystectomy. Pancreas: Mild pancreatic atrophy. No pancreatic mass or pancreatic ductal dilatation. No pancreatic or  peripancreatic fluid or inflammatory changes. Spleen: Spleen is enlarged measuring 21.2 x 6.2 x 17.8 cm (estimated splenic volume of 1,170 mL). Adrenals/Urinary Tract: 2 small sub cm low-attenuation lesions in the right kidney are too small to definitively characterize, but are similar to the prior examination and statistically likely cysts. 2 cm low-attenuation lesion in the lower pole of the left kidney is compatible with a simple cyst. No hydroureteronephrosis. Urinary bladder is normal in appearance. Bilateral adrenal glands are normal in appearance.  Stomach/Bowel: Numerous large varices noted adjacent to the cardia and fundus of the stomach. Stomach is otherwise unremarkable in appearance. No pathologic dilatation of small bowel or colon. Numerous colonic diverticulae are noted, without surrounding inflammatory changes to suggest an acute diverticulitis at this time. Status post appendectomy. There postoperative changes suggestive of prior partial distal small bowel resection. Vascular/Lymphatic: Aortic atherosclerosis (mild), without evidence of aneurysm or dissection in the abdominal or pelvic vasculature. Portal vein is dilated measuring 19 mm in the porta hepatis. No lymphadenopathy noted in the abdomen or pelvis. Reproductive: Uterus and ovaries are unremarkable in appearance. Other: Trace volume of ascites, most evident in the perihepatic region. No pneumoperitoneum. Musculoskeletal: There are no aggressive appearing lytic or blastic lesions noted in the visualized portions of the skeleton. IMPRESSION: 1. No acute findings noted in the abdomen or pelvis to account for the patient's symptoms. 2. Morphologic changes in the liver compatible with underlying cirrhosis. There is evidence of portal venous hypertension, as demonstrated by dilated portal vein (19 mm in diameter), splenomegaly, and numerous portal venous collateral pathways, including large proximal gastric and distal esophageal varices, as  detailed above. 3. Trace volume of ascites. 4. Colonic diverticulosis without definite evidence to suggest an acute diverticulitis at this time. 5. Cardiomegaly. 6. Aortic atherosclerosis. 7. Additional incidental findings, as above. Electronically Signed   By: Vinnie Langton M.D.   On: 07/10/2016 13:55    EKG: Independently reviewed. Will order  Assessment/Plan Principal Problem:   RUQ pain- goes down and around to the right side on exam - no etiology on imaging- ?muscular- Oxycodone 10 mg and Robaxin 500 mg QID, Dilaudid PRN for breakthrough, Voltaren gel - ER has consulted gen surgery as well  Active Problems: A-fib with RVR - resume home dose of Cardizem - pain currently 7/10 may be contributing- control pain - will only increase Inderal to 20 mg BID and cont Cardizem which she took this AM    Obstructive sleep apnea CPAP  Chronic dCHF, pedal edema - resume Bumex and Aldactone    Essential hypertension - Cardizem, Inderal, Lasix Aldactone    Cirrhosis of liver, with portal HTN & Ascites/ splenomegaly, esophageal and gastric varices - due to NASH   - Inderal and diuretics    Morbid obesity- BMI 56    Diabetes mellitus, type 2 (HCC) - ISS- hold Glimeperide    Thrombocytopenia (H[facility])  - stable - avoid anticoagulants    DVT prophylaxis: SCDs Code Status: Full code Family Communication: husband  Disposition Plan: telemetry   Consults called: Gen surgery called by ER Admission status: observation    McClelland MD [facility] Pager: [email or web address] Password [email or web address] [phone], please contact night-coverage   07/10/2016, 4:55 PM

## 2016-07-10 NOTE — ED Triage Notes (Signed)
Pt c/o 10/10 abd pain that started this AM. Pt reports nausea and denies emesis. Pt has hx portal hypertension. Pt A+OX4, afebrile, speaking in complete sentences.

## 2016-07-10 NOTE — ED Provider Notes (Signed)
[facility] DEPT Provider Note   CSN: [id number] Arrival date & time: 07/10/16  1115     History   Chief Complaint Chief Complaint  Patient presents with  . Abdominal Pain    HPI Hannah Khan is a 60 y.o. female.  Patient with history of atrial fibrillation, ITP, diabetes, fibromyalgia, [place], obesity, previous cholecystectomy, unknown appendectomy, colon resection due to polyps -- presents with onset of right-sided abdominal pain, severe, acute onset, around 10:30 AM. Patient did have some middle back pain during the middle the night. No fevers or vomiting. Patient has been nauseous. No constipation or diarrhea. Last bowel movement was yesterday. She is not passing gas today but does feel somewhat bloated. No urinary symptoms. No treatments prior to arrival. Patient is not on anticoagulation for a A. fib due to her low platelets. Most recent check was in the 20s. The course is constant. Aggravating factors: movement and palpation. Alleviating factors: none.        Past Medical History:  Diagnosis Date  . Atrial fibrillation ([place])   . Blood dyscrasia   . Diabetes mellitus without complication ([place])   . Dysrhythmia   . Fibromyalgia   . HTN (hypertension)   . Hx of colonic polyps    s/p partial colectomy  . Hyperlipidemia   . ITP (idiopathic thrombocytopenic purpura)   . Left leg swelling    wear compression hose  . Liver cirrhosis secondary to NASH (nonalcoholic steatohepatitis) ([place]) dx nov 2014   had enlarged spleen also   . Low blood pressure   . Morbid obesity ([place])   . PONV (postoperative nausea and vomiting)    in past none recent  . Portal hypertension ([place])   . Shortness of breath dyspnea    history of with fluid overload  . Sleep apnea    uses cpap setting of 14  . Sore on scalp    may bleed secondary to ITP  . Spleen enlarged     Patient Active Problem List   Diagnosis Date Noted  . Atrial fibrillation ([place]) 01/30/2016  . Diabetes mellitus,  type 2 ([place]) 01/30/2016  . Hypokalemia 01/30/2016  . Acute on chronic diastolic CHF (congestive heart failure), NYHA class 2 ([place]) 01/26/2016  . Morbid obesity- BMI 56 12/10/2015  . Cellulitis of abdominal wall   . Cellulitis 01/31/2015  . Melena 01/31/2015  . Aortic heart murmur 01/31/2015  . Abdominal pain 07/23/2013  . Cirrhosis of liver, with portal HTN & Acites, by CT Abdomen 07/20/13 07/23/2013  . Dyspnea 07/23/2013  . Pancytopenia ([place]) 07/23/2013  . Primary thrombocytopenia ([place]) 12/23/2008  . Obstructive sleep apnea 08/07/2007  . Essential hypertension 08/07/2007  . ALLERGIC RHINITIS 08/07/2007  . DIABETES MELLITUS, BORDERLINE 08/07/2007    Past Surgical History:  Procedure Laterality Date  . CESAREAN SECTION  1989  . CHOLECYSTECTOMY  20 yrs ago  . COLECTOMY  2011  . DILATATION & CURETTAGE/HYSTEROSCOPY WITH MYOSURE N/A 05/06/2016   Procedure: DILATATION & CURETTAGE/HYSTEROSCOPY WITH MYOSURE WITH POLYPECTOMY;  Surgeon: Janyth Pupa, DO;  Location: [facility];  Service: Gynecology;  Laterality: N/A;  . ESOPHAGOGASTRODUODENOSCOPY (EGD) WITH PROPOFOL N/A 09/04/2013   Procedure: ESOPHAGOGASTRODUODENOSCOPY (EGD) WITH PROPOFOL;  Surgeon: [name], MD;  Location: [facility];  Service: Endoscopy;  Laterality: N/A;  . KNEE ARTHROSCOPY  yrs ago   bilateral, one done x 1, one done twice    OB History    No data available       Home Medications    Prior  to Admission medications   Medication Sig Start Date End Date Taking? Authorizing Provider  bumetanide (BUMEX) 2 MG tablet Take 2 tablets by mouth twice daily. Ok to take 2 tablets 3 times a day as needed for weight gain Patient taking differently: Take 4 mg by mouth 2 (two) times daily.  06/06/16  Yes Skeet Latch, MD  Cholecalciferol (VITAMIN D) 2000 UNITS tablet Take 2,000 Units by mouth daily.   Yes Historical Provider, MD  Coenzyme Q10 (CO Q 10) 100 MG CAPS Take 100 mg by mouth daily.   Yes Historical Provider, MD    cyanocobalamin 1000 MCG tablet Take 1,000 mcg by mouth daily.   Yes Historical Provider, MD  diltiazem (CARDIZEM CD) 240 MG 24 hr capsule TAKE 1 CAPSULE (240 MG TOTAL) BY MOUTH DAILY. 06/08/16  Yes Skeet Latch, MD  Ferrous Sulfate (IRON) 325 (65 FE) MG TABS Take 325 mg by mouth daily.    Yes Historical Provider, MD  glimepiride (AMARYL) 2 MG tablet Take 2 mg by mouth daily with breakfast.  03/11/16  Yes Historical Provider, MD  methocarbamol (ROBAXIN) 500 MG tablet Take 250-750 mg by mouth daily as needed for muscle spasms.    Yes Historical Provider, MD  Potassium Chloride ER 20 MEQ TBCR TAKE 2 TABLETS BY MOUTH TWICE A DAY Patient taking differently: Take 40 mEq by mouth 2 (two) times daily.  06/06/16  Yes Skeet Latch, MD  propranolol (INDERAL) 10 MG tablet Take 1 tablet (10 mg total) by mouth 2 (two) times daily. 06/06/16  Yes Skeet Latch, MD  spironolactone (ALDACTONE) 25 MG tablet Take 25 mg by mouth daily.  07/24/13  Yes Thurnell Lose, MD  Blood Glucose Monitoring Suppl (ONE TOUCH ULTRA MINI) w/Device KIT See admin instructions. 02/13/16   Historical Provider, MD  Multiple Vitamins-Minerals (MULTIVITAMIN PO) Take 1 tablet by mouth daily.    Historical Provider, MD  ONE TOUCH ULTRA TEST test strip as directed. 02/13/16   Historical Provider, MD  Jonetta Speak LANCETS 99J MISC as directed. 02/13/16   Historical Provider, MD    Family History Family History  Problem Relation Age of Onset  . Heart failure Mother   . Cancer Mother   . Hyperlipidemia Mother   . Lung cancer Father   . Cancer Father   . Cancer Brother   . Hyperlipidemia Brother   . Stroke Other     Social History Social History  Substance Use Topics  . Smoking status: Former Smoker    Packs/day: 1.00    Years: 3.00    Types: Cigarettes    Quit date: 09/19/1974  . Smokeless tobacco: Never Used  . Alcohol use 0.6 oz/week    1 Standard drinks or equivalent per week     Comment: occ     Allergies    Celecoxib; Prednisone; Shellfish allergy; Statins; Ibuprofen; and Tylenol [acetaminophen]   Review of Systems Review of Systems  Constitutional: Negative for fever.  HENT: Negative for rhinorrhea and sore throat.   Eyes: Negative for redness.  Respiratory: Negative for cough.   Cardiovascular: Negative for chest pain.  Gastrointestinal: Positive for abdominal pain and nausea. Negative for diarrhea and vomiting.  Genitourinary: Negative for dysuria.  Musculoskeletal: Negative for myalgias.  Skin: Negative for rash.  Neurological: Negative for headaches.     Physical Exam Updated Vital Signs BP 107/61 (BP Location: Left Arm)   Pulse 113   Temp 97.9 F (36.6 C) (Oral)   Resp 22   SpO2 99%  Physical Exam  Constitutional: She appears well-developed and well-nourished.  HENT:  Head: Normocephalic and atraumatic.  Mouth/Throat: Oropharynx is clear and moist.  Eyes: Conjunctivae are normal. Right eye exhibits no discharge. Left eye exhibits no discharge.  Neck: Normal range of motion. Neck supple.  Cardiovascular: Normal rate, regular rhythm and normal heart sounds.   Pulmonary/Chest: Effort normal and breath sounds normal. No respiratory distress. She has no wheezes. She has no rales.  Abdominal: Soft. She exhibits no distension. There is tenderness. There is guarding.  Exam is limited by habitus.   Neurological: She is alert.  Skin: Skin is warm and dry.  Psychiatric: She has a normal mood and affect.  Nursing note and vitals reviewed.    ED Treatments / Results  Labs (all labs ordered are listed, but only abnormal results are displayed) Labs Reviewed  COMPREHENSIVE METABOLIC PANEL - Abnormal; Notable for the following:       Result Value   Glucose, Bld 156 (*)    BUN 22 (*)    Total Bilirubin 2.5 (*)    All other components within normal limits  URINALYSIS, ROUTINE W REFLEX MICROSCOPIC (NOT AT [facility]) - Abnormal; Notable for the following:    Color, Urine AMBER (*)     Hgb urine dipstick TRACE (*)    All other components within normal limits  CBC - Abnormal; Notable for the following:    RBC 3.84 (*)    Hemoglobin 11.9 (*)    HCT 35.2 (*)    Platelets 33 (*)    All other components within normal limits  URINE MICROSCOPIC-ADD ON - Abnormal; Notable for the following:    Squamous Epithelial / LPF 6-30 (*)    Bacteria, UA RARE (*)    All other components within normal limits  LIPASE, BLOOD  I-STAT CG4 LACTIC ACID, ED    Radiology Ct Abdomen Pelvis W Contrast  Result Date: 07/10/2016 CLINICAL DATA:  60 year old female complaining of 10 out of 10 abdominal pain since this morning. Nausea. No emesis. EXAM: CT ABDOMEN AND PELVIS WITH CONTRAST TECHNIQUE: Multidetector CT imaging of the abdomen and pelvis was performed using the standard protocol following bolus administration of intravenous contrast. CONTRAST:  175m ISOVUE-300 IOPAMIDOL (ISOVUE-300) INJECTION 61% COMPARISON:  CT the abdomen and pelvis 01/29/2015. FINDINGS: Lower chest: Mild scarring in the visualize lung bases. Cardiomegaly. Large paraesophageal varices. Hepatobiliary: The liver has a shrunken appearance and nodular contour, compatible with underlying cirrhosis. Contrast bolus on today's examination is sub optimal. With these limitations in mind, there is no definite cystic or solid hepatic lesion. No intra or extrahepatic biliary ductal dilatation. Status post cholecystectomy. Pancreas: Mild pancreatic atrophy. No pancreatic mass or pancreatic ductal dilatation. No pancreatic or peripancreatic fluid or inflammatory changes. Spleen: Spleen is enlarged measuring 21.2 x 6.2 x 17.8 cm (estimated splenic volume of 1,170 mL). Adrenals/Urinary Tract: 2 small sub cm low-attenuation lesions in the right kidney are too small to definitively characterize, but are similar to the prior examination and statistically likely cysts. 2 cm low-attenuation lesion in the lower pole of the left kidney is compatible  with a simple cyst. No hydroureteronephrosis. Urinary bladder is normal in appearance. Bilateral adrenal glands are normal in appearance. Stomach/Bowel: Numerous large varices noted adjacent to the cardia and fundus of the stomach. Stomach is otherwise unremarkable in appearance. No pathologic dilatation of small bowel or colon. Numerous colonic diverticulae are noted, without surrounding inflammatory changes to suggest an acute diverticulitis at this time. Status post appendectomy. There  postoperative changes suggestive of prior partial distal small bowel resection. Vascular/Lymphatic: Aortic atherosclerosis (mild), without evidence of aneurysm or dissection in the abdominal or pelvic vasculature. Portal vein is dilated measuring 19 mm in the porta hepatis. No lymphadenopathy noted in the abdomen or pelvis. Reproductive: Uterus and ovaries are unremarkable in appearance. Other: Trace volume of ascites, most evident in the perihepatic region. No pneumoperitoneum. Musculoskeletal: There are no aggressive appearing lytic or blastic lesions noted in the visualized portions of the skeleton. IMPRESSION: 1. No acute findings noted in the abdomen or pelvis to account for the patient's symptoms. 2. Morphologic changes in the liver compatible with underlying cirrhosis. There is evidence of portal venous hypertension, as demonstrated by dilated portal vein (19 mm in diameter), splenomegaly, and numerous portal venous collateral pathways, including large proximal gastric and distal esophageal varices, as detailed above. 3. Trace volume of ascites. 4. Colonic diverticulosis without definite evidence to suggest an acute diverticulitis at this time. 5. Cardiomegaly. 6. Aortic atherosclerosis. 7. Additional incidental findings, as above. Electronically Signed   By: Vinnie Langton M.D.   On: 07/10/2016 13:55    Procedures Procedures (including critical care time)  Medications Ordered in ED Medications  sodium chloride 0.9  % bolus 1,000 mL (1,000 mLs Intravenous New Bag/Given 07/10/16 1300)  HYDROmorphone (DILAUDID) injection 1 mg (1 mg Intravenous Given 07/10/16 1303)  ondansetron (ZOFRAN) injection 4 mg (4 mg Intravenous Given 07/10/16 1303)  iopamidol (ISOVUE-300) 61 % injection 100 mL (100 mLs Intravenous Contrast Given 07/10/16 1326)  HYDROmorphone (DILAUDID) injection 1 mg (1 mg Intravenous Given 07/10/16 1519)     Initial Impression / Assessment and Plan / ED Course  I have reviewed the triage vital signs and the nursing notes.  Pertinent labs & imaging results that were available during my care of the patient were reviewed by me and considered in my medical decision making (see chart for details).  Clinical Course   Patient seen and examined. Work-up initiated. Medications ordered. CT pending.   Vital signs reviewed and are as follows: BP 107/61 (BP Location: Left Arm)   Pulse 113   Temp 97.9 F (36.6 C) (Oral)   Resp 22   SpO2 99%   CT as above. No acute findings. However, patient continues to have severe pain and tachycardia. Additional pain medicine ordered. Patient discussed and seen with Dr. Audie Pinto. Dr. Audie Pinto spoke with Dr. Excell Seltzer who advises admission for serial exams, repeat labs. Request hospitalist admission due to multiple medical problems.   CBC was hemolyzed and was re-drawn. Pending lactate as well.   4:22 PM Spoke with Dr. Wynelle Cleveland who will see. HR remains 110-125 in room. Pain controlled currently.    Final Clinical Impressions(s) / ED Diagnoses   Final diagnoses:  Right lower quadrant abdominal pain  Admit for obs.  New Prescriptions New Prescriptions   No medications on file     Carlisle Cater, PA-C 07/10/16 1623    Leonard Schwartz, MD 07/16/16 1818

## 2016-07-10 NOTE — Consult Note (Signed)
Reason for Consult: Right upper quadrant pain  Referring Physician: Audie Pinto, EDP  Hannah Khan is an 60 y.o. female.   HPI: Asked to see this patient due to acute right upper quadrant pain. She has multiple medical problems as detailed below. She was in her usual state of health when she awoke this morning. About 10 AM today she was coming back from the bathroom and sat down in her chair and developed the sudden onset of very severe right upper quadrant abdominal pain. She describes this as 10 over 10 pain. Considered calling EMS brought her husband brought her to the emergency department. The pain has persisted since then although is somewhat relieved with pain medications. She describes constant pain which seems to radiate around from her right flank to her right upper quadrant and right mid abdomen. She describes constant sharp and aching pain with sharp exacerbations. The pain is worse with any jostling or any physical movement. She had some slight nausea with onset and has had a little nausea after pain medication in the ED but no vomiting. No fever or chills. Bowel movements have been normal without blood. No urinary symptoms. She had been feeling well leading up to this. No history of any similar previous problems.  Past Medical History:  Diagnosis Date  . Atrial fibrillation ([place])   . Blood dyscrasia   . Diabetes mellitus without complication ([place])   . Dysrhythmia   . Fibromyalgia   . HTN (hypertension)   . Hx of colonic polyps    s/p partial colectomy  . Hyperlipidemia   . ITP (idiopathic thrombocytopenic purpura)   . Left leg swelling    wear compression hose  . Liver cirrhosis secondary to NASH (nonalcoholic steatohepatitis) ([place]) dx nov 2014   had enlarged spleen also   . Low blood pressure   . Morbid obesity ([place])   . PONV (postoperative nausea and vomiting)    in past none recent  . Portal hypertension ([place])   . Shortness of breath dyspnea    history of with fluid  overload  . Sleep apnea    uses cpap setting of 14  . Sore on scalp    may bleed secondary to ITP  . Spleen enlarged     Past Surgical History:  Procedure Laterality Date  . CESAREAN SECTION  1989  . CHOLECYSTECTOMY  20 yrs ago  . COLECTOMY  2011  . DILATATION & CURETTAGE/HYSTEROSCOPY WITH MYOSURE N/A 05/06/2016   Procedure: DILATATION & CURETTAGE/HYSTEROSCOPY WITH MYOSURE WITH POLYPECTOMY;  Surgeon: Janyth Pupa, DO;  Location: [facility];  Service: Gynecology;  Laterality: N/A;  . ESOPHAGOGASTRODUODENOSCOPY (EGD) WITH PROPOFOL N/A 09/04/2013   Procedure: ESOPHAGOGASTRODUODENOSCOPY (EGD) WITH PROPOFOL;  Surgeon: [name], MD;  Location: [facility];  Service: Endoscopy;  Laterality: N/A;  . KNEE ARTHROSCOPY  yrs ago   bilateral, one done x 1, one done twice    Family History  Problem Relation Age of Onset  . Heart failure Mother   . Cancer Mother   . Hyperlipidemia Mother   . Lung cancer Father   . Cancer Father   . Cancer Brother   . Hyperlipidemia Brother   . Stroke Other     Social History:  reports that she quit smoking about 41 years ago. Her smoking use included Cigarettes. She has a 3.00 pack-year smoking history. She has never used smokeless tobacco. She reports that she drinks about 0.6 oz of alcohol per week . She reports that she does not use  drugs.  Allergies:  Allergies  Allergen Reactions  . Celecoxib Other (See Comments)    "speech slurred" with celebrex  . Prednisone Other (See Comments)    Rapid heart rate  . Shellfish Allergy Swelling    Lips tingle and swell. Whelps the size of quarters.   . Statins Other (See Comments)    aggravated fibromyalgia  . Ibuprofen     Due to platelets  . Tylenol [Acetaminophen]     Due to platelets    Current Facility-Administered Medications  Medication Dose Route Frequency Provider Last Rate Last Dose  . 0.9 %  sodium chloride infusion  250 mL Intravenous PRN Debbe Odea, MD      . bumetanide (BUMEX) tablet  4 mg  4 mg Oral BID Debbe Odea, MD      . diclofenac sodium (VOLTAREN) 1 % transdermal gel 4 g  4 g Topical QID Debbe Odea, MD      . diltiazem (CARDIZEM CD) 24 hr capsule 240 mg  240 mg Oral Daily Saima Rizwan, MD      . HYDROmorphone (DILAUDID) injection 1 mg  1 mg Intravenous Q4H PRN Saima Rizwan, MD      . insulin aspart (novoLOG) injection 0-15 Units  0-15 Units Subcutaneous TID WC Saima Rizwan, MD      . insulin aspart (novoLOG) injection 0-5 Units  0-5 Units Subcutaneous QHS Debbe Odea, MD      . Iron TABS 325 mg  325 mg Oral Daily Saima Rizwan, MD      . methocarbamol (ROBAXIN) tablet 500 mg  500 mg Oral QID Debbe Odea, MD      . ondansetron (ZOFRAN) injection 4 mg  4 mg Intravenous Q8H PRN Carlisle Cater, PA-C   4 mg at 07/10/16 1655  . ondansetron (ZOFRAN) injection 4 mg  4 mg Intravenous Q6H PRN Debbe Odea, MD      . ondansetron (ZOFRAN) tablet 4 mg  4 mg Oral Q6H PRN Debbe Odea, MD       Or  . ondansetron (ZOFRAN) injection 4 mg  4 mg Intravenous Q6H PRN Debbe Odea, MD      . oxyCODONE (Oxy IR/ROXICODONE) immediate release tablet 10 mg  10 mg Oral QID Debbe Odea, MD      . propranolol (INDERAL) tablet 20 mg  20 mg Oral BID Debbe Odea, MD      . senna-docusate (Senokot-S) tablet 1 tablet  1 tablet Oral QHS PRN Debbe Odea, MD      . sodium chloride 0.9 % bolus 1,000 mL  1,000 mL Intravenous Once [employer], PA-C      . sodium chloride flush (NS) 0.9 % injection 3 mL  3 mL Intravenous Q12H Saima Rizwan, MD      . sodium chloride flush (NS) 0.9 % injection 3 mL  3 mL Intravenous PRN Debbe Odea, MD      . spironolactone (ALDACTONE) tablet 25 mg  25 mg Oral Daily Saima Rizwan, MD      . vitamin B-12 (CYANOCOBALAMIN) tablet 1,000 mcg  1,000 mcg Oral Daily Debbe Odea, MD      . Vitamin D 2,000 Units  2,000 Units Oral Daily Debbe Odea, MD       Current Outpatient Prescriptions  Medication Sig Dispense Refill  . bumetanide (BUMEX) 2 MG tablet Take 2 tablets by  mouth twice daily. Ok to take 2 tablets 3 times a day as needed for weight gain (Patient taking differently: Take 4 mg by mouth  2 (two) times daily. ) 420 tablet 3  . Cholecalciferol (VITAMIN D) 2000 UNITS tablet Take 2,000 Units by mouth daily.    . Coenzyme Q10 (CO Q 10) 100 MG CAPS Take 100 mg by mouth daily.    . cyanocobalamin 1000 MCG tablet Take 1,000 mcg by mouth daily.    Marland Kitchen diltiazem (CARDIZEM CD) 240 MG 24 hr capsule TAKE 1 CAPSULE (240 MG TOTAL) BY MOUTH DAILY. 30 capsule 5  . Ferrous Sulfate (IRON) 325 (65 FE) MG TABS Take 325 mg by mouth daily.     Marland Kitchen glimepiride (AMARYL) 2 MG tablet Take 2 mg by mouth daily with breakfast.     . methocarbamol (ROBAXIN) 500 MG tablet Take 250-750 mg by mouth daily as needed for muscle spasms.     . Potassium Chloride ER 20 MEQ TBCR TAKE 2 TABLETS BY MOUTH TWICE A DAY (Patient taking differently: Take 40 mEq by mouth 2 (two) times daily. ) 360 tablet 3  . propranolol (INDERAL) 10 MG tablet Take 1 tablet (10 mg total) by mouth 2 (two) times daily. 180 tablet 3  . spironolactone (ALDACTONE) 25 MG tablet Take 25 mg by mouth daily.     . Blood Glucose Monitoring Suppl (ONE TOUCH ULTRA MINI) w/Device KIT See admin instructions.  0  . Multiple Vitamins-Minerals (MULTIVITAMIN PO) Take 1 tablet by mouth daily.    . ONE TOUCH ULTRA TEST test strip as directed.  0  . ONETOUCH DELICA LANCETS 27P MISC as directed.  0     Results for orders placed or performed during the hospital encounter of 07/10/16 (from the past 48 hour(s))  Lipase, blood     Status: None   Collection Time: 07/10/16 12:02 PM  Result Value Ref Range   Lipase 32 11 - 51 U/L  Comprehensive metabolic panel     Status: Abnormal   Collection Time: 07/10/16 12:02 PM  Result Value Ref Range   Sodium 138 135 - 145 mmol/L   Potassium 3.5 3.5 - 5.1 mmol/L   Chloride 102 101 - 111 mmol/L   CO2 28 22 - 32 mmol/L   Glucose, Bld 156 (H) 65 - 99 mg/dL   BUN 22 (H) 6 - 20 mg/dL   Creatinine, Ser  0.92 0.44 - 1.00 mg/dL   Calcium 9.1 8.9 - 10.3 mg/dL   Total Protein 7.9 6.5 - 8.1 g/dL   Albumin 3.9 3.5 - 5.0 g/dL   AST 39 15 - 41 U/L   ALT 24 14 - 54 U/L   Alkaline Phosphatase 79 38 - 126 U/L   Total Bilirubin 2.5 (H) 0.3 - 1.2 mg/dL   GFR calc non Af Amer >60 >60 mL/min   GFR calc Af Amer >60 >60 mL/min    Comment: (NOTE) The eGFR has been calculated using the CKD EPI equation. This calculation has not been validated in all clinical situations. eGFR's persistently <60 mL/min signify possible Chronic Kidney Disease.    Anion gap 8 5 - 15  Urinalysis, Routine w reflex microscopic     Status: Abnormal   Collection Time: 07/10/16  2:00 PM  Result Value Ref Range   Color, Urine AMBER (A) YELLOW    Comment: BIOCHEMICALS MAY BE AFFECTED BY COLOR   APPearance CLEAR CLEAR   Specific Gravity, Urine 1.030 1.005 - 1.030   pH 6.0 5.0 - 8.0   Glucose, UA NEGATIVE NEGATIVE mg/dL   Hgb urine dipstick TRACE (A) NEGATIVE   Bilirubin Urine NEGATIVE NEGATIVE  Ketones, ur NEGATIVE NEGATIVE mg/dL   Protein, ur NEGATIVE NEGATIVE mg/dL   Nitrite NEGATIVE NEGATIVE   Leukocytes, UA NEGATIVE NEGATIVE  Urine microscopic-add on     Status: Abnormal   Collection Time: 07/10/16  2:00 PM  Result Value Ref Range   Squamous Epithelial / LPF 6-30 (A) NONE SEEN   WBC, UA NONE SEEN 0 - 5 WBC/hpf   RBC / HPF 0-5 0 - 5 RBC/hpf   Bacteria, UA RARE (A) NONE SEEN   Urine-Other MUCOUS PRESENT   CBC     Status: Abnormal   Collection Time: 07/10/16  3:20 PM  Result Value Ref Range   WBC 8.4 4.0 - 10.5 K/uL   RBC 3.84 (L) 3.87 - 5.11 MIL/uL   Hemoglobin 11.9 (L) 12.0 - 15.0 g/dL   HCT 35.2 (L) 36.0 - 46.0 %   MCV 91.7 78.0 - 100.0 fL   MCH 31.0 26.0 - 34.0 pg   MCHC 33.8 30.0 - 36.0 g/dL   RDW 14.8 11.5 - 15.5 %   Platelets 33 (L) 150 - 400 K/uL    Comment: SPECIMEN CHECKED FOR CLOTS REPEATED TO VERIFY PLATELET COUNT CONFIRMED BY SMEAR   I-Stat CG4 Lactic Acid, ED     Status: None   Collection  Time: 07/10/16  3:36 PM  Result Value Ref Range   Lactic Acid, Venous 1.58 0.5 - 1.9 mmol/L    Ct Abdomen Pelvis W Contrast  Result Date: 07/10/2016 CLINICAL DATA:  60 year old female complaining of 10 out of 10 abdominal pain since this morning. Nausea. No emesis. EXAM: CT ABDOMEN AND PELVIS WITH CONTRAST TECHNIQUE: Multidetector CT imaging of the abdomen and pelvis was performed using the standard protocol following bolus administration of intravenous contrast. CONTRAST:  147m ISOVUE-300 IOPAMIDOL (ISOVUE-300) INJECTION 61% COMPARISON:  CT the abdomen and pelvis 01/29/2015. FINDINGS: Lower chest: Mild scarring in the visualize lung bases. Cardiomegaly. Large paraesophageal varices. Hepatobiliary: The liver has a shrunken appearance and nodular contour, compatible with underlying cirrhosis. Contrast bolus on today's examination is sub optimal. With these limitations in mind, there is no definite cystic or solid hepatic lesion. No intra or extrahepatic biliary ductal dilatation. Status post cholecystectomy. Pancreas: Mild pancreatic atrophy. No pancreatic mass or pancreatic ductal dilatation. No pancreatic or peripancreatic fluid or inflammatory changes. Spleen: Spleen is enlarged measuring 21.2 x 6.2 x 17.8 cm (estimated splenic volume of 1,170 mL). Adrenals/Urinary Tract: 2 small sub cm low-attenuation lesions in the right kidney are too small to definitively characterize, but are similar to the prior examination and statistically likely cysts. 2 cm low-attenuation lesion in the lower pole of the left kidney is compatible with a simple cyst. No hydroureteronephrosis. Urinary bladder is normal in appearance. Bilateral adrenal glands are normal in appearance. Stomach/Bowel: Numerous large varices noted adjacent to the cardia and fundus of the stomach. Stomach is otherwise unremarkable in appearance. No pathologic dilatation of small bowel or colon. Numerous colonic diverticulae are noted, without  surrounding inflammatory changes to suggest an acute diverticulitis at this time. Status post appendectomy. There postoperative changes suggestive of prior partial distal small bowel resection. Vascular/Lymphatic: Aortic atherosclerosis (mild), without evidence of aneurysm or dissection in the abdominal or pelvic vasculature. Portal vein is dilated measuring 19 mm in the porta hepatis. No lymphadenopathy noted in the abdomen or pelvis. Reproductive: Uterus and ovaries are unremarkable in appearance. Other: Trace volume of ascites, most evident in the perihepatic region. No pneumoperitoneum. Musculoskeletal: There are no aggressive appearing lytic or blastic lesions noted  in the visualized portions of the skeleton. IMPRESSION: 1. No acute findings noted in the abdomen or pelvis to account for the patient's symptoms. 2. Morphologic changes in the liver compatible with underlying cirrhosis. There is evidence of portal venous hypertension, as demonstrated by dilated portal vein (19 mm in diameter), splenomegaly, and numerous portal venous collateral pathways, including large proximal gastric and distal esophageal varices, as detailed above. 3. Trace volume of ascites. 4. Colonic diverticulosis without definite evidence to suggest an acute diverticulitis at this time. 5. Cardiomegaly. 6. Aortic atherosclerosis. 7. Additional incidental findings, as above. Electronically Signed   By: Vinnie Langton M.D.   On: 07/10/2016 13:55    Review of Systems  Constitutional: Negative for chills and fever.  Respiratory: Negative for cough, hemoptysis, shortness of breath and wheezing.   Cardiovascular: Positive for palpitations and leg swelling. Negative for chest pain.  Gastrointestinal: Positive for abdominal pain and nausea. Negative for blood in stool, constipation, diarrhea, heartburn, melena and vomiting.  Genitourinary: Negative for dysuria, frequency, hematuria and urgency.  Musculoskeletal: Positive for back pain,  joint pain and myalgias.   Blood pressure 142/80, pulse 106, temperature 97.9 F (36.6 C), temperature source Oral, resp. rate 10, height 5' 7"  (1.702 m), weight (!) 155.1 kg (342 lb), SpO2 92 %. Physical Exam General: Pleasant alert morbidly obese Caucasian female who appears in pain Skin: Warm and dry without rash or infection. HEENT: No palpable masses or thyromegaly. Sclera nonicteric. Pupils equal round and reactive. Oropharynx clear. Lymph nodes: No cervical, supraclavicular,  nodes palpable. Lungs: Breath sounds are distant but clear and equal without increased work of breathing Cardiovascular: Irregular mild tachycardia without murmur. No JVD. 1+ lower extremity edema. Peripheral pulses intact. Abdomen: Marked obesity. Tender in the right upper quadrant and right mid abdomen but no peritoneal signs. No rash in this area. No masses palpable. No organomegaly. No palpable hernias. Exam limited by obesity Extremities: 1+ lower extremity edema. Moderate  chronic venous stasis changes. Neurologic: Alert and fully oriented. No gross motor or sensory deficits.  Assessment/Plan: Patient with multiple medical problems including extreme morbid obesity, atrial fibrillation, thrombocytopenia preventing anticoagulation, cirrhosis secondary to NASH with gastric varices. Status post right colectomy 2011 for tubulovillous adenoma and status post cholecystectomy. She has acute severe right sided abdominal pain without really any associated symptoms. I have personally reviewed her CT scan which shows chronic problems as noted above but no evidence of any acute process. She has no leukocytosis or elevated LFTs. Lactic acid is normal. The source for her pain is unclear. I'm wondering if this could be musculoskeletal although no injuries reported. No rash to suggest zoster. Her pain was very sudden onset which would argue against appendicitis or inflammatory process and there is no evidence of infection. Possibly  radicular pain from her back could be responsible. I don't see any evidence of acute surgical problem. Recommend symptomatic management and close follow-up.  Anirudh Baiz T 07/10/2016, 5:00 PM

## 2016-07-11 DIAGNOSIS — D696 Thrombocytopenia, unspecified: Secondary | ICD-10-CM | POA: Diagnosis not present

## 2016-07-11 DIAGNOSIS — I482 Chronic atrial fibrillation: Secondary | ICD-10-CM

## 2016-07-11 DIAGNOSIS — I5032 Chronic diastolic (congestive) heart failure: Secondary | ICD-10-CM | POA: Diagnosis not present

## 2016-07-11 DIAGNOSIS — R1011 Right upper quadrant pain: Secondary | ICD-10-CM | POA: Diagnosis not present

## 2016-07-11 DIAGNOSIS — M7918 Myalgia, other site: Secondary | ICD-10-CM | POA: Diagnosis present

## 2016-07-11 LAB — BASIC METABOLIC PANEL
Anion gap: 7 (ref 5–15)
BUN: 22 mg/dL — AB (ref 6–20)
CO2: 26 mmol/L (ref 22–32)
CREATININE: 0.94 mg/dL (ref 0.44–1.00)
Calcium: 8.5 mg/dL — ABNORMAL LOW (ref 8.9–10.3)
Chloride: 107 mmol/L (ref 101–111)
GFR calc Af Amer: >60 mL/min (ref >60)
GLUCOSE: 162 mg/dL — AB (ref 65–99)
Potassium: 3.8 mmol/L (ref 3.5–5.1)
SODIUM: 140 mmol/L (ref 135–145)

## 2016-07-11 LAB — GLUCOSE, CAPILLARY
GLUCOSE-CAPILLARY: 151 mg/dL — AB (ref 65–99)
Glucose-Capillary: 130 mg/dL — ABNORMAL HIGH (ref 65–99)

## 2016-07-11 MED ORDER — KETOROLAC TROMETHAMINE 30 MG/ML IJ SOLN: 30.0000 mg | Freq: Four times a day (QID) | INTRAMUSCULAR | Status: DC | PRN

## 2016-07-11 MED ORDER — LIDOCAINE 5 % EX PTCH
2.0000 | MEDICATED_PATCH | CUTANEOUS | Status: DC
  Administered 2016-07-11: 2 via TRANSDERMAL
  Filled 2016-07-11: qty 2

## 2016-07-11 MED ORDER — LIDOCAINE 5 % EX PTCH: 2.0000 | MEDICATED_PATCH | CUTANEOUS | 0 refills | Status: DC

## 2016-07-11 MED ORDER — DICLOFENAC SODIUM 1 % TD GEL: 4.0000 g | Freq: Four times a day (QID) | TRANSDERMAL | 0 refills | Status: DC

## 2016-07-11 NOTE — Discharge Summary (Signed)
Physician Discharge Summary  Hannah Khan ACZ:[id number] DOB: 04-10-1956 DOA: 07/10/2016  PCP: Lujean Amel, MD  Admit date: 07/10/2016 Discharge date: 07/11/2016  Admitted From: home Disposition: home  Recommendations for Outpatient Follow-up:  Follow up with PCP in 1-2 weeks   Home Health:none Equipment/Devices: none  Discharge Condition:stable CODE STATUS: full code Diet recommendation: Heart Healthy / Carb Modified    Discharge Diagnoses:  Principal Problem:   Muscular abdominal pain in right upper quadrant  Active Problems:   Obstructive sleep apnea   Essential hypertension   Cirrhosis of liver, with portal HTN & Acites, by CT Abdomen 07/20/13   Morbid obesity- BMI 56   Atrial fibrillation (HCC)   Diabetes mellitus, type 2 (HCC)   Chronic diastolic CHF (congestive heart failure) ([place])   Thrombocytopenia ([place])   Brief narrative/ HPI 60 year old morbidly obese female with history of A. fib, NASH cirrhosis with portal hypertension and severe cytopenia, diastolic CHF, fibromyalgia, OSA, type 2 diabetes mellitus presented with acute right upper quadrant abdominal pain extending to the side and right upper back laterally. Had mild nausea but no vomiting, no fevers, chills, dysuria or diarrhea. In the ED she was in A. fib with heart rate in 110-130s, worsened with movement and pain. CT abdomen and pelvis without acute findings.. (Status post cholecystectomy). Showed cirrhosis with portal hypertension, splenomegaly, gastric and esophageal varices and trace ascites. Patient is a hospitalist service. Surgery consulted.   Hospital course Right upper quadrant abdominal pain Appears to be musculoskeletal, reproducible pain on pressure. CT abdomen unremarkable for acute findings. LFTs normal. Continue her Robaxin. Added oxycodone patient hesitant to take it as it makes her sleepy. Ordered diclofenac topical gel and Lidoderm patch which has effectively controlled her pain. Also  hot compress alleviates the pain. Seen by [place] [facility] and recommend conservative management, no role for surgery. Patient clinically stable to be discharged home. Patient instructed that she could use and occasional dose of Tylenol if pain not controlled with topical agel and lidoderm patch. . Avoid NSAID.  Follow-up with PCP.   A. fib with RVR Resume home dose Cardizem. Heart rate stable and pain under control. Continue inderal.  Obstructive sleep apnea Continue CPAP  NASH cirrhosis with portal hypertension chronic thrombocytopenia and varices Continue Inderal, Bumex and Aldactone.  Essential hypertension Stable. Continue home medications  Type 2 diabetes mellitus Continue Amaryl.     Discharge Instructions     Medication List    TAKE these medications   bumetanide 2 MG tablet Commonly known as:  BUMEX Take 2 tablets by mouth twice daily. Ok to take 2 tablets 3 times a day as needed for weight gain What changed:  how much to take  how to take this  when to take this  additional instructions   Co Q 10 100 MG Caps Take 100 mg by mouth daily.   cyanocobalamin 1000 MCG tablet Take 1,000 mcg by mouth daily.   diclofenac sodium 1 % Gel Commonly known as:  VOLTAREN Apply 4 g topically 4 (four) times daily.   diltiazem 240 MG 24 hr capsule Commonly known as:  CARDIZEM CD TAKE 1 CAPSULE (240 MG TOTAL) BY MOUTH DAILY.   glimepiride 2 MG tablet Commonly known as:  AMARYL Take 2 mg by mouth daily with breakfast.   Iron 325 (65 Fe) MG Tabs Take 325 mg by mouth daily.   lidocaine 5 % Commonly known as:  LIDODERM Place 2 patches onto the skin daily. Remove & Discard patch within 12 hours  or as directed by MD Start taking on:  07/12/2016   methocarbamol 500 MG tablet Commonly known as:  ROBAXIN Take 250-750 mg by mouth daily as needed for muscle spasms.   MULTIVITAMIN PO Take 1 tablet by mouth daily.   ONE TOUCH ULTRA MINI w/Device Kit See admin  instructions.   ONE TOUCH ULTRA TEST test strip Generic drug:  glucose blood as directed.   ONETOUCH DELICA LANCETS 98P Misc as directed.   Potassium Chloride ER 20 MEQ Tbcr TAKE 2 TABLETS BY MOUTH TWICE A DAY What changed:  how much to take  how to take this  when to take this  additional instructions   propranolol 10 MG tablet Commonly known as:  INDERAL Take 1 tablet (10 mg total) by mouth 2 (two) times daily.   spironolactone 25 MG tablet Commonly known as:  ALDACTONE Take 25 mg by mouth daily.   Vitamin D 2000 units tablet Take 2,000 Units by mouth daily.      Follow-up Information    KOIRALA,DIBAS, MD. Schedule an appointment as soon as possible for a visit in 1 week(s).   Specialty:  Family Medicine Contact information: [address] [place] [address] [phone]          Allergies  Allergen Reactions  . Celecoxib Other (See Comments)    "speech slurred" with celebrex  . Prednisone Other (See Comments)    Rapid heart rate  . Shellfish Allergy Swelling    Lips tingle and swell. Whelps the size of quarters.   . Statins Other (See Comments)    aggravated fibromyalgia  . Ibuprofen     Due to platelets  . Tylenol [Acetaminophen]     Due to platelets    Consultations: [facility]  Procedures/Studies: Ct Abdomen Pelvis W Contrast  Result Date: 07/10/2016 CLINICAL DATA:  60 year old female complaining of 10 out of 10 abdominal pain since this morning. Nausea. No emesis. EXAM: CT ABDOMEN AND PELVIS WITH CONTRAST TECHNIQUE: Multidetector CT imaging of the abdomen and pelvis was performed using the standard protocol following bolus administration of intravenous contrast. CONTRAST:  123m ISOVUE-300 IOPAMIDOL (ISOVUE-300) INJECTION 61% COMPARISON:  CT the abdomen and pelvis 01/29/2015. FINDINGS: Lower chest: Mild scarring in the visualize lung bases. Cardiomegaly. Large paraesophageal varices. Hepatobiliary: The liver has a  shrunken appearance and nodular contour, compatible with underlying cirrhosis. Contrast bolus on today's examination is sub optimal. With these limitations in mind, there is no definite cystic or solid hepatic lesion. No intra or extrahepatic biliary ductal dilatation. Status post cholecystectomy. Pancreas: Mild pancreatic atrophy. No pancreatic mass or pancreatic ductal dilatation. No pancreatic or peripancreatic fluid or inflammatory changes. Spleen: Spleen is enlarged measuring 21.2 x 6.2 x 17.8 cm (estimated splenic volume of 1,170 mL). Adrenals/Urinary Tract: 2 small sub cm low-attenuation lesions in the right kidney are too small to definitively characterize, but are similar to the prior examination and statistically likely cysts. 2 cm low-attenuation lesion in the lower pole of the left kidney is compatible with a simple cyst. No hydroureteronephrosis. Urinary bladder is normal in appearance. Bilateral adrenal glands are normal in appearance. Stomach/Bowel: Numerous large varices noted adjacent to the cardia and fundus of the stomach. Stomach is otherwise unremarkable in appearance. No pathologic dilatation of small bowel or colon. Numerous colonic diverticulae are noted, without surrounding inflammatory changes to suggest an acute diverticulitis at this time. Status post appendectomy. There postoperative changes suggestive of prior partial distal small bowel resection. Vascular/Lymphatic: Aortic atherosclerosis (mild),  without evidence of aneurysm or dissection in the abdominal or pelvic vasculature. Portal vein is dilated measuring 19 mm in the porta hepatis. No lymphadenopathy noted in the abdomen or pelvis. Reproductive: Uterus and ovaries are unremarkable in appearance. Other: Trace volume of ascites, most evident in the perihepatic region. No pneumoperitoneum. Musculoskeletal: There are no aggressive appearing lytic or blastic lesions noted in the visualized portions of the skeleton. IMPRESSION: 1. No  acute findings noted in the abdomen or pelvis to account for the patient's symptoms. 2. Morphologic changes in the liver compatible with underlying cirrhosis. There is evidence of portal venous hypertension, as demonstrated by dilated portal vein (19 mm in diameter), splenomegaly, and numerous portal venous collateral pathways, including large proximal gastric and distal esophageal varices, as detailed above. 3. Trace volume of ascites. 4. Colonic diverticulosis without definite evidence to suggest an acute diverticulitis at this time. 5. Cardiomegaly. 6. Aortic atherosclerosis. 7. Additional incidental findings, as above. Electronically Signed   By: Vinnie Langton M.D.   On: 07/10/2016 13:55     Subjective: Pain in RUQ extending to right upper back. Localized. No N/V  Discharge Exam: Vitals:   07/11/16 0642 07/11/16 1523  BP: (!) 96/47 (!) 100/56  Pulse: (!) 109 97  Resp: 18 16  Temp: 98.2 F (36.8 C) 97.4 F (36.3 C)   Vitals:   07/10/16 1746 07/10/16 2120 07/11/16 0642 07/11/16 1523  BP: 126/62 (!) 98/50 (!) 96/47 (!) 100/56  Pulse: (!) 120 (!) 105 (!) 109 97  Resp: _0 Temp: 98.2 F (36.8 C) 98.4 F (36.9 C) 98.2 F (36.8 C) 97.4 F (36.3 C)  TempSrc:  Oral Oral Oral  SpO2: 96% 98% 99% 97%  Weight: (!) 158.8 kg (350 lb 1.5 oz)     Height: 5' 7" (1.702 m)       General: Morbidly obese female not in distress HEENT: moist mucosa, supple neck Cardiovascular: NS1&S2, no murmurs Respiratory: CTA bilaterally, no wheezing, no rhonchi Abdominal: Soft, ND, bowel sounds +, RUQ and rt upper back tenderness, Extremities: warm, no edema   The results of significant diagnostics from this hospitalization (including imaging, microbiology, ancillary and laboratory) are listed below for reference.     Microbiology: No results found for this or any previous visit (from the past 240 hour(s)).   Labs: BNP (last 3 results)  Recent Labs  01/26/16 1128  BNP 223.1*   Basic  Metabolic Panel:  Recent Labs Lab 07/10/16 1202 07/11/16 0515  NA 138 140  K 3.5 3.8  CL 102 107  CO2 28 26  GLUCOSE 156* 162*  BUN 22* 22*  CREATININE 0.92 0.94  CALCIUM 9.1 8.5*   Liver Function Tests:  Recent Labs Lab 07/10/16 1202  AST 39  ALT 24  ALKPHOS 79  BILITOT 2.5*  PROT 7.9  ALBUMIN 3.9    Recent Labs Lab 07/10/16 1202  LIPASE 32   No results for input(s): AMMONIA in the last 168 hours. CBC:  Recent Labs Lab 07/08/16 1456 07/10/16 1520  WBC 3.4* 8.4  NEUTROABS 2.2  --   HGB 11.0* 11.9*  HCT 32.0* 35.2*  MCV 89.6 91.7  PLT 28* 33*   Cardiac Enzymes: No results for input(s): CKTOTAL, CKMB, CKMBINDEX, TROPONINI in the last 168 hours. BNP: Invalid input(s): POCBNP CBG:  Recent Labs Lab 07/10/16 1709 07/10/16 1757 07/10/16 2110 07/11/16 0816 07/11/16 1136  GLUCAP 144* 181* 188* 151* 130*   D-Dimer No results for input(s): DDIMER in the  last 72 hours. Hgb A1c No results for input(s): HGBA1C in the last 72 hours. Lipid Profile No results for input(s): CHOL, HDL, LDLCALC, TRIG, CHOLHDL, LDLDIRECT in the last 72 hours. Thyroid function studies No results for input(s): TSH, T4TOTAL, T3FREE, THYROIDAB in the last 72 hours.  Invalid input(s): FREET3 Anemia work up No results for input(s): VITAMINB12, FOLATE, FERRITIN, TIBC, IRON, RETICCTPCT in the last 72 hours. Urinalysis    Component Value Date/Time   COLORURINE AMBER (A) 07/10/2016 1400   APPEARANCEUR CLEAR 07/10/2016 1400   LABSPEC 1.030 07/10/2016 1400   PHURINE 6.0 07/10/2016 1400   GLUCOSEU NEGATIVE 07/10/2016 1400   HGBUR TRACE (A) 07/10/2016 1400   BILIRUBINUR NEGATIVE 07/10/2016 1400   KETONESUR NEGATIVE 07/10/2016 1400   PROTEINUR NEGATIVE 07/10/2016 1400   UROBILINOGEN 2.0 (H) 01/31/2015 0055   NITRITE NEGATIVE 07/10/2016 1400   LEUKOCYTESUR NEGATIVE 07/10/2016 1400   Sepsis Labs Invalid input(s): PROCALCITONIN,  WBC,  LACTICIDVEN Microbiology No results found  for this or any previous visit (from the past 240 hour(s)).   Time coordinating discharge: < 30 minutes  SIGNED:   Louellen Molder, MD  [facility] 07/11/2016, 4:04 PM Pager   If 7PM-7AM, please contact night-coverage [email or web address] Password [email or web address]

## 2016-07-11 NOTE — Progress Notes (Signed)
Resumed patient care. Agree with previous RN assessment. Orders reviewed. Pt currently resting in bed comfortably. Will continue to monitor. Adah Salvage, RN

## 2016-07-11 NOTE — Progress Notes (Signed)
Patient ID: Hannah Khan, female   DOB: 1956/01/13, 60 y.o.   MRN: [id number]  [facility] Surgery Progress Note     Subjective: Less pain today than yesterday. Some nausea with pain medication but no emesis. Diclofenac gel helping right-sided pain.  Objective: Vital signs in last 24 hours: Temp:  [98.2 F (36.8 C)-98.4 F (36.9 C)] 98.2 F (36.8 C) (10/23 7353) Pulse Rate:  [82-120] 109 (10/23 0642) Resp:  [10-20] 18 (10/23 0642) BP: (96-142)/(47-80) 96/47 (10/23 0642) SpO2:  [90 %-99 %] 99 % (10/23 0642) Weight:  [350 lb 1.5 oz (158.8 kg)] 350 lb 1.5 oz (158.8 kg) (10/22 1746) Last BM Date: 07/09/16  Intake/Output from previous day: No intake/output data recorded. Intake/Output this shift: Total I/O In: 480 [P.O.:480] Out: 250 [Urine:250]  Lab Results:   Recent Labs  07/08/16 1456 07/10/16 1520  WBC 3.4* 8.4  HGB 11.0* 11.9*  HCT 32.0* 35.2*  PLT 28* 33*   BMET  Recent Labs  07/10/16 1202 07/11/16 0515  NA 138 140  K 3.5 3.8  CL 102 107  CO2 28 26  GLUCOSE 156* 162*  BUN 22* 22*  CREATININE 0.92 0.94  CALCIUM 9.1 8.5*   PT/INR No results for input(s): LABPROT, INR in the last 72 hours. CMP     Component Value Date/Time   NA 140 07/11/2016 0515   NA 142 08/01/2014 1509   K 3.8 07/11/2016 0515   K 4.1 08/01/2014 1509   CL 107 07/11/2016 0515   CL 107 08/03/2012 1449   CO2 26 07/11/2016 0515   CO2 23 08/01/2014 1509   GLUCOSE 162 (H) 07/11/2016 0515   GLUCOSE 99 08/01/2014 1509   GLUCOSE 172 (H) 08/03/2012 1449   BUN 22 (H) 07/11/2016 0515   BUN 16.9 08/01/2014 1509   CREATININE 0.94 07/11/2016 0515   CREATININE 0.77 06/28/2016 1157   CREATININE 0.7 08/01/2014 1509   CALCIUM 8.5 (L) 07/11/2016 0515   CALCIUM 9.3 08/01/2014 1509   PROT 7.9 07/10/2016 1202   PROT 7.0 08/01/2014 1509   ALBUMIN 3.9 07/10/2016 1202   ALBUMIN 3.4 (L) 08/01/2014 1509   AST 39 07/10/2016 1202   AST 54 (H) 08/01/2014 1509   ALT 24 07/10/2016 1202   ALT  30 08/01/2014 1509   ALKPHOS 79 07/10/2016 1202   ALKPHOS 82 08/01/2014 1509   BILITOT 2.5 (H) 07/10/2016 1202   BILITOT 1.71 (H) 08/01/2014 1509   GFRNONAA >60 07/11/2016 0515   GFRAA >60 07/11/2016 0515   Lipase     Component Value Date/Time   LIPASE 32 07/10/2016 1202       Studies/Results: Ct Abdomen Pelvis W Contrast  Result Date: 07/10/2016 CLINICAL DATA:  60 year old female complaining of 10 out of 10 abdominal pain since this morning. Nausea. No emesis. EXAM: CT ABDOMEN AND PELVIS WITH CONTRAST TECHNIQUE: Multidetector CT imaging of the abdomen and pelvis was performed using the standard protocol following bolus administration of intravenous contrast. CONTRAST:  179m ISOVUE-300 IOPAMIDOL (ISOVUE-300) INJECTION 61% COMPARISON:  CT the abdomen and pelvis 01/29/2015. FINDINGS: Lower chest: Mild scarring in the visualize lung bases. Cardiomegaly. Large paraesophageal varices. Hepatobiliary: The liver has a shrunken appearance and nodular contour, compatible with underlying cirrhosis. Contrast bolus on today's examination is sub optimal. With these limitations in mind, there is no definite cystic or solid hepatic lesion. No intra or extrahepatic biliary ductal dilatation. Status post cholecystectomy. Pancreas: Mild pancreatic atrophy. No pancreatic mass or pancreatic ductal dilatation. No pancreatic or peripancreatic fluid  or inflammatory changes. Spleen: Spleen is enlarged measuring 21.2 x 6.2 x 17.8 cm (estimated splenic volume of 1,170 mL). Adrenals/Urinary Tract: 2 small sub cm low-attenuation lesions in the right kidney are too small to definitively characterize, but are similar to the prior examination and statistically likely cysts. 2 cm low-attenuation lesion in the lower pole of the left kidney is compatible with a simple cyst. No hydroureteronephrosis. Urinary bladder is normal in appearance. Bilateral adrenal glands are normal in appearance. Stomach/Bowel: Numerous large varices  noted adjacent to the cardia and fundus of the stomach. Stomach is otherwise unremarkable in appearance. No pathologic dilatation of small bowel or colon. Numerous colonic diverticulae are noted, without surrounding inflammatory changes to suggest an acute diverticulitis at this time. Status post appendectomy. There postoperative changes suggestive of prior partial distal small bowel resection. Vascular/Lymphatic: Aortic atherosclerosis (mild), without evidence of aneurysm or dissection in the abdominal or pelvic vasculature. Portal vein is dilated measuring 19 mm in the porta hepatis. No lymphadenopathy noted in the abdomen or pelvis. Reproductive: Uterus and ovaries are unremarkable in appearance. Other: Trace volume of ascites, most evident in the perihepatic region. No pneumoperitoneum. Musculoskeletal: There are no aggressive appearing lytic or blastic lesions noted in the visualized portions of the skeleton. IMPRESSION: 1. No acute findings noted in the abdomen or pelvis to account for the patient's symptoms. 2. Morphologic changes in the liver compatible with underlying cirrhosis. There is evidence of portal venous hypertension, as demonstrated by dilated portal vein (19 mm in diameter), splenomegaly, and numerous portal venous collateral pathways, including large proximal gastric and distal esophageal varices, as detailed above. 3. Trace volume of ascites. 4. Colonic diverticulosis without definite evidence to suggest an acute diverticulitis at this time. 5. Cardiomegaly. 6. Aortic atherosclerosis. 7. Additional incidental findings, as above. Electronically Signed   By: Vinnie Langton M.D.   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 CT scan  shows chronic problems but no evidence of any acute process. She has no leukocytosis or elevated LFTs. Lactic acid is normal. - diclofenac gel helping pain  Morbid obesity Atrial fibrillation RVR Thrombocytopenia preventing anticoagulation Cirrhosis secondary to NASH with gastric varices dCHF Fibromyalgia HTN OSA DM  VTE - SCDs FEN - card modified  Plan - seems to be musculoskeletal as diclofenac gel is helping. No surgical intervention needed, general surgery will sign off.   LOS: 0 days    Jerrye Beavers , [facility] 07/11/2016, 1:29 PM Pager: [phone] Consults: [phone] Mon-Fri 7:00 am-4:30 pm Sat-Sun 7:00 am-11:30 am

## 2016-08-29 ENCOUNTER — Ambulatory Visit: Payer: BC Managed Care – PPO | Admitting: Cardiovascular Disease

## 2016-10-20 DIAGNOSIS — J189 Pneumonia, unspecified organism: Secondary | ICD-10-CM

## 2016-10-20 HISTORY — DX: Pneumonia, unspecified organism: J18.9

## 2016-10-21 ENCOUNTER — Ambulatory Visit: Payer: BC Managed Care – PPO | Admitting: Cardiovascular Disease

## 2016-10-31 ENCOUNTER — Encounter (HOSPITAL_COMMUNITY): Payer: Self-pay | Admitting: Family Medicine

## 2016-10-31 ENCOUNTER — Ambulatory Visit: Payer: BC Managed Care – PPO | Admitting: Cardiovascular Disease

## 2016-10-31 ENCOUNTER — Emergency Department (HOSPITAL_COMMUNITY): Payer: BC Managed Care – PPO

## 2016-10-31 ENCOUNTER — Inpatient Hospital Stay (HOSPITAL_COMMUNITY)
Admission: EM | Admit: 2016-10-31 | Discharge: 2016-11-02 | DRG: 871 | Disposition: A | Discharge status: Home / Self Care | Payer: BC Managed Care – PPO | Attending: Family Medicine | Admitting: Family Medicine

## 2016-10-31 DIAGNOSIS — K7469 Other cirrhosis of liver: Secondary | ICD-10-CM

## 2016-10-31 DIAGNOSIS — D693 Immune thrombocytopenic purpura: Secondary | ICD-10-CM | POA: Diagnosis present

## 2016-10-31 DIAGNOSIS — Z79899 Other long term (current) drug therapy: Secondary | ICD-10-CM

## 2016-10-31 DIAGNOSIS — K766 Portal hypertension: Secondary | ICD-10-CM | POA: Diagnosis present

## 2016-10-31 DIAGNOSIS — E119 Type 2 diabetes mellitus without complications: Secondary | ICD-10-CM

## 2016-10-31 DIAGNOSIS — J4521 Mild intermittent asthma with (acute) exacerbation: Secondary | ICD-10-CM | POA: Diagnosis present

## 2016-10-31 DIAGNOSIS — I5033 Acute on chronic diastolic (congestive) heart failure: Secondary | ICD-10-CM | POA: Diagnosis present

## 2016-10-31 DIAGNOSIS — I10 Essential (primary) hypertension: Secondary | ICD-10-CM | POA: Diagnosis not present

## 2016-10-31 DIAGNOSIS — J189 Pneumonia, unspecified organism: Secondary | ICD-10-CM | POA: Diagnosis present

## 2016-10-31 DIAGNOSIS — K7460 Unspecified cirrhosis of liver: Secondary | ICD-10-CM | POA: Diagnosis present

## 2016-10-31 DIAGNOSIS — Z886 Allergy status to analgesic agent status: Secondary | ICD-10-CM

## 2016-10-31 DIAGNOSIS — M797 Fibromyalgia: Secondary | ICD-10-CM | POA: Diagnosis present

## 2016-10-31 DIAGNOSIS — D6959 Other secondary thrombocytopenia: Secondary | ICD-10-CM | POA: Diagnosis present

## 2016-10-31 DIAGNOSIS — I482 Chronic atrial fibrillation: Secondary | ICD-10-CM | POA: Diagnosis present

## 2016-10-31 DIAGNOSIS — D61818 Other pancytopenia: Secondary | ICD-10-CM | POA: Diagnosis present

## 2016-10-31 DIAGNOSIS — Z9989 Dependence on other enabling machines and devices: Secondary | ICD-10-CM

## 2016-10-31 DIAGNOSIS — A419 Sepsis, unspecified organism: Principal | ICD-10-CM | POA: Diagnosis present

## 2016-10-31 DIAGNOSIS — J9601 Acute respiratory failure with hypoxia: Secondary | ICD-10-CM | POA: Diagnosis present

## 2016-10-31 DIAGNOSIS — Z6841 Body Mass Index (BMI) 40.0 and over, adult: Secondary | ICD-10-CM | POA: Diagnosis not present

## 2016-10-31 DIAGNOSIS — Z8249 Family history of ischemic heart disease and other diseases of the circulatory system: Secondary | ICD-10-CM | POA: Diagnosis not present

## 2016-10-31 DIAGNOSIS — K7581 Nonalcoholic steatohepatitis (NASH): Secondary | ICD-10-CM | POA: Diagnosis present

## 2016-10-31 DIAGNOSIS — I110 Hypertensive heart disease with heart failure: Secondary | ICD-10-CM | POA: Diagnosis present

## 2016-10-31 DIAGNOSIS — R188 Other ascites: Secondary | ICD-10-CM | POA: Diagnosis present

## 2016-10-31 DIAGNOSIS — Z888 Allergy status to other drugs, medicaments and biological substances status: Secondary | ICD-10-CM

## 2016-10-31 DIAGNOSIS — E785 Hyperlipidemia, unspecified: Secondary | ICD-10-CM | POA: Diagnosis present

## 2016-10-31 DIAGNOSIS — D72819 Decreased white blood cell count, unspecified: Secondary | ICD-10-CM | POA: Diagnosis present

## 2016-10-31 DIAGNOSIS — D649 Anemia, unspecified: Secondary | ICD-10-CM | POA: Diagnosis present

## 2016-10-31 DIAGNOSIS — Z87891 Personal history of nicotine dependence: Secondary | ICD-10-CM | POA: Diagnosis not present

## 2016-10-31 DIAGNOSIS — I5043 Acute on chronic combined systolic (congestive) and diastolic (congestive) heart failure: Secondary | ICD-10-CM | POA: Diagnosis present

## 2016-10-31 DIAGNOSIS — G4733 Obstructive sleep apnea (adult) (pediatric): Secondary | ICD-10-CM | POA: Diagnosis present

## 2016-10-31 DIAGNOSIS — J181 Lobar pneumonia, unspecified organism: Secondary | ICD-10-CM | POA: Diagnosis not present

## 2016-10-31 DIAGNOSIS — Z91013 Allergy to seafood: Secondary | ICD-10-CM

## 2016-10-31 DIAGNOSIS — I4821 Permanent atrial fibrillation: Secondary | ICD-10-CM | POA: Diagnosis present

## 2016-10-31 LAB — COMPREHENSIVE METABOLIC PANEL
ALT: 17 U/L (ref 14–54)
AST: 33 U/L (ref 15–41)
Albumin: 3.4 g/dL — ABNORMAL LOW (ref 3.5–5.0)
Alkaline Phosphatase: 61 U/L (ref 38–126)
Anion gap: 8 (ref 5–15)
BILIRUBIN TOTAL: 2.5 mg/dL — AB (ref 0.3–1.2)
BUN: 16 mg/dL (ref 6–20)
CALCIUM: 8.7 mg/dL — AB (ref 8.9–10.3)
CO2: 26 mmol/L (ref 22–32)
CREATININE: 0.87 mg/dL (ref 0.44–1.00)
Chloride: 102 mmol/L (ref 101–111)
GFR calc Af Amer: >60 mL/min (ref >60)
Glucose, Bld: 175 mg/dL — ABNORMAL HIGH (ref 65–99)
Potassium: 3.5 mmol/L (ref 3.5–5.1)
Sodium: 136 mmol/L (ref 135–145)
TOTAL PROTEIN: 7.1 g/dL (ref 6.5–8.1)

## 2016-10-31 LAB — CBC WITH DIFFERENTIAL/PLATELET
BASOS ABS: 0 10*3/uL (ref 0.0–0.1)
Basophils Relative: 0 %
EOS ABS: 0 10*3/uL (ref 0.0–0.7)
Eosinophils Relative: 0 %
HCT: 29.7 % — ABNORMAL LOW (ref 36.0–46.0)
Hemoglobin: 10 g/dL — ABNORMAL LOW (ref 12.0–15.0)
LYMPHS ABS: 0.2 10*3/uL — AB (ref 0.7–4.0)
Lymphocytes Relative: 10 %
MCH: 30.1 pg (ref 26.0–34.0)
MCHC: 33.7 g/dL (ref 30.0–36.0)
MCV: 89.5 fL (ref 78.0–100.0)
MONO ABS: 0.1 10*3/uL (ref 0.1–1.0)
Monocytes Relative: 4 %
Neutro Abs: 2.1 10*3/uL (ref 1.7–7.7)
Neutrophils Relative %: 86 %
PLATELETS: 20 10*3/uL — AB (ref 150–400)
RBC: 3.32 MIL/uL — ABNORMAL LOW (ref 3.87–5.11)
RDW: 14.8 % (ref 11.5–15.5)
WBC: 2.4 10*3/uL — ABNORMAL LOW (ref 4.0–10.5)

## 2016-10-31 LAB — BRAIN NATRIURETIC PEPTIDE: B Natriuretic Peptide: 232.8 pg/mL — ABNORMAL HIGH (ref 0.0–100.0)

## 2016-10-31 LAB — INFLUENZA PANEL BY PCR (TYPE A & B)
INFLBPCR: NEGATIVE
Influenza A By PCR: NEGATIVE

## 2016-10-31 LAB — GLUCOSE, CAPILLARY: GLUCOSE-CAPILLARY: 281 mg/dL — AB (ref 65–99)

## 2016-10-31 LAB — I-STAT TROPONIN, ED: Troponin i, poc: 0 ng/mL (ref 0.00–0.08)

## 2016-10-31 MED ORDER — FUROSEMIDE 10 MG/ML IJ SOLN
40.0000 mg | Freq: Once | INTRAMUSCULAR | Status: AC
  Administered 2016-10-31: 40 mg via INTRAVENOUS
  Filled 2016-10-31: qty 4

## 2016-10-31 MED ORDER — HYDROCODONE-HOMATROPINE 5-1.5 MG/5ML PO SYRP
5.0000 mL | ORAL_SOLUTION | Freq: Three times a day (TID) | ORAL | Status: DC | PRN
  Administered 2016-11-01: 5 mL via ORAL
  Filled 2016-10-31: qty 5

## 2016-10-31 MED ORDER — BENZONATATE 100 MG PO CAPS
100.0000 mg | ORAL_CAPSULE | Freq: Three times a day (TID) | ORAL | Status: DC | PRN
  Administered 2016-10-31 – 2016-11-02 (×4): 100 mg via ORAL
  Filled 2016-10-31 (×4): qty 1

## 2016-10-31 MED ORDER — DILTIAZEM HCL ER COATED BEADS 240 MG PO CP24
240.0000 mg | ORAL_CAPSULE | Freq: Every day | ORAL | Status: DC
  Administered 2016-11-01 – 2016-11-02 (×2): 240 mg via ORAL
  Filled 2016-10-31 (×2): qty 1

## 2016-10-31 MED ORDER — AZITHROMYCIN 250 MG PO TABS
500.0000 mg | ORAL_TABLET | ORAL | Status: DC
  Administered 2016-11-01: 500 mg via ORAL
  Filled 2016-10-31: qty 2

## 2016-10-31 MED ORDER — LEVALBUTEROL HCL 1.25 MG/0.5ML IN NEBU
1.2500 mg | INHALATION_SOLUTION | Freq: Three times a day (TID) | RESPIRATORY_TRACT | Status: DC
  Administered 2016-11-01 – 2016-11-02 (×3): 1.25 mg via RESPIRATORY_TRACT
  Filled 2016-10-31 (×5): qty 0.5

## 2016-10-31 MED ORDER — ACETAMINOPHEN 650 MG RE SUPP: 650.0000 mg | Freq: Four times a day (QID) | RECTAL | Status: DC | PRN

## 2016-10-31 MED ORDER — LEVALBUTEROL HCL 0.63 MG/3ML IN NEBU
0.6300 mg | INHALATION_SOLUTION | Freq: Four times a day (QID) | RESPIRATORY_TRACT | Status: DC | PRN
  Administered 2016-10-31 – 2016-11-01 (×2): 0.63 mg via RESPIRATORY_TRACT
  Filled 2016-10-31 (×2): qty 3

## 2016-10-31 MED ORDER — POTASSIUM CHLORIDE CRYS ER 20 MEQ PO TBCR
40.0000 meq | EXTENDED_RELEASE_TABLET | Freq: Two times a day (BID) | ORAL | Status: DC
  Administered 2016-10-31 – 2016-11-02 (×4): 40 meq via ORAL
  Filled 2016-10-31 (×4): qty 2

## 2016-10-31 MED ORDER — ONDANSETRON HCL 4 MG/2ML IJ SOLN: 4.0000 mg | Freq: Four times a day (QID) | INTRAMUSCULAR | Status: DC | PRN

## 2016-10-31 MED ORDER — ONDANSETRON HCL 4 MG PO TABS: 4.0000 mg | ORAL_TABLET | Freq: Four times a day (QID) | ORAL | Status: DC | PRN

## 2016-10-31 MED ORDER — INSULIN ASPART 100 UNIT/ML ~~LOC~~ SOLN
0.0000 [IU] | Freq: Three times a day (TID) | SUBCUTANEOUS | Status: DC
  Administered 2016-11-01: 2 [IU] via SUBCUTANEOUS
  Administered 2016-11-01: 1 [IU] via SUBCUTANEOUS
  Administered 2016-11-01: 2 [IU] via SUBCUTANEOUS
  Administered 2016-11-02: 1 [IU] via SUBCUTANEOUS

## 2016-10-31 MED ORDER — INSULIN ASPART 100 UNIT/ML ~~LOC~~ SOLN
0.0000 [IU] | Freq: Every day | SUBCUTANEOUS | Status: DC
  Administered 2016-10-31: 3 [IU] via SUBCUTANEOUS

## 2016-10-31 MED ORDER — LEVALBUTEROL HCL 0.63 MG/3ML IN NEBU
0.6300 mg | INHALATION_SOLUTION | Freq: Once | RESPIRATORY_TRACT | Status: AC
  Administered 2016-10-31: 0.63 mg via RESPIRATORY_TRACT

## 2016-10-31 MED ORDER — DEXTROSE 5 % IV SOLN
1.0000 g | INTRAVENOUS | Status: DC
  Administered 2016-11-01: 1 g via INTRAVENOUS
  Filled 2016-10-31: qty 10

## 2016-10-31 MED ORDER — IPRATROPIUM BROMIDE 0.02 % IN SOLN
0.5000 mg | Freq: Once | RESPIRATORY_TRACT | Status: AC
Start: 2016-10-31 — End: 2016-10-31
  Administered 2016-10-31: 0.5 mg via RESPIRATORY_TRACT
  Filled 2016-10-31: qty 2.5

## 2016-10-31 MED ORDER — ACETAMINOPHEN 325 MG PO TABS: 650.0000 mg | ORAL_TABLET | Freq: Four times a day (QID) | ORAL | Status: DC | PRN

## 2016-10-31 MED ORDER — POTASSIUM CHLORIDE ER 20 MEQ PO TBCR: 40.0000 meq | EXTENDED_RELEASE_TABLET | Freq: Two times a day (BID) | ORAL | Status: DC

## 2016-10-31 MED ORDER — AZITHROMYCIN 500 MG IV SOLR
500.0000 mg | Freq: Once | INTRAVENOUS | Status: AC
  Administered 2016-10-31: 500 mg via INTRAVENOUS
  Filled 2016-10-31: qty 500

## 2016-10-31 MED ORDER — METHOCARBAMOL 500 MG PO TABS
250.0000 mg | ORAL_TABLET | Freq: Every day | ORAL | Status: DC | PRN
  Administered 2016-11-02: 500 mg via ORAL
  Filled 2016-10-31: qty 1

## 2016-10-31 MED ORDER — BUMETANIDE 2 MG PO TABS
4.0000 mg | ORAL_TABLET | Freq: Two times a day (BID) | ORAL | Status: DC
Start: 2016-11-01 — End: 2016-11-02
  Administered 2016-11-01 – 2016-11-02 (×3): 4 mg via ORAL
  Filled 2016-10-31 (×4): qty 2

## 2016-10-31 MED ORDER — HYDROCODONE-HOMATROPINE 5-1.5 MG PO TABS: 1.0000 | ORAL_TABLET | Freq: Three times a day (TID) | ORAL | Status: DC | PRN

## 2016-10-31 MED ORDER — ALBUTEROL SULFATE (2.5 MG/3ML) 0.083% IN NEBU: 5.0000 mg | INHALATION_SOLUTION | Freq: Once | RESPIRATORY_TRACT | Status: DC

## 2016-10-31 MED ORDER — SPIRONOLACTONE 25 MG PO TABS
25.0000 mg | ORAL_TABLET | Freq: Every day | ORAL | Status: DC
  Administered 2016-11-01 – 2016-11-02 (×2): 25 mg via ORAL
  Filled 2016-10-31 (×2): qty 1

## 2016-10-31 MED ORDER — IPRATROPIUM BROMIDE 0.02 % IN SOLN
0.5000 mg | Freq: Three times a day (TID) | RESPIRATORY_TRACT | Status: DC
  Administered 2016-11-01 – 2016-11-02 (×4): 0.5 mg via RESPIRATORY_TRACT
  Filled 2016-10-31 (×4): qty 2.5

## 2016-10-31 MED ORDER — PROPRANOLOL HCL 10 MG PO TABS
10.0000 mg | ORAL_TABLET | Freq: Two times a day (BID) | ORAL | Status: DC
  Administered 2016-10-31 – 2016-11-02 (×4): 10 mg via ORAL
  Filled 2016-10-31 (×4): qty 1

## 2016-10-31 MED ORDER — DEXTROSE 5 % IV SOLN
1.0000 g | Freq: Once | INTRAVENOUS | Status: AC
  Administered 2016-10-31: 1 g via INTRAVENOUS
  Filled 2016-10-31: qty 10

## 2016-10-31 NOTE — ED Notes (Signed)
Failed attmpt to collect labs

## 2016-10-31 NOTE — H&P (Signed)
History and Physical  Patient Name: Hannah Khan     OZH:[id number]    DOB: 1956-04-22    DOA: 10/31/2016 PCP: Lujean Amel, MD   Patient coming from: Home --> PCP's office  Chief Complaint: Cough, dyspnea, wheezing  HPI: Hannah Khan is a 61 y.o. female with a past medical history significant for cryptogenic/NASH cirrhosis c/b severe chronic thrombocytopenia, NIDDM, HTN, HFpEF, and Afib not on warfarin who presents with productive cough and fevers.  The patient is in her usual state of health until a couple weeks ago when she developed upper respiratory symptoms, cough. This lingered, but she sought no care until this weekend when she got significantly worse, developed headache, myalgias, fever to 101, worsening now productive cough, and wheezing.  She went to UC on Saturday where she was prescribed Hycodan and Xopenex for bronchitis.  Her husband around that time was prescribed Tamiflu for influenza.  Since then, she has had progressively worse cough, so today she went to her PCP's office where she was sent here for tachycardia, fever, and concern for pneumonia.  ED course: -Afebrile, heart rate 118, respirations 20-30, blood pressure 127/62, pulse oximetry 93% at rest, dipping into the upper 80s. -Na 136, K 3.5, Cr 0.87, WBC 2.4K, Hgb 10 (baseline around 11), platelets 20K (baseline <50K) -Total bilirubin 2.5 (near baseline) -BNP 232 -CXR showed vascular congestion and RLL opacity -Troponin negative, ECG unremarkable -She was given ceftriaxone and azithromycin as well as duo nebs and Terry's request to evaluate for pneumonia     ROS: Review of Systems  Constitutional: Positive for fever and malaise/fatigue.  HENT: Positive for sore throat.   Respiratory: Positive for cough, sputum production, shortness of breath and wheezing.   Cardiovascular: Positive for chest pain (with cough) and leg swelling (has not taken Bumex in 3 days).  Musculoskeletal: Positive for myalgias.  All  other systems reviewed and are negative.         Past Medical History:  Diagnosis Date  . Atrial fibrillation ([place])   . Blood dyscrasia   . Diabetes mellitus without complication ([place])   . Dysrhythmia   . Fibromyalgia   . HTN (hypertension)   . Hx of colonic polyps    s/p partial colectomy  . Hyperlipidemia   . ITP (idiopathic thrombocytopenic purpura)   . Left leg swelling    wear compression hose  . Liver cirrhosis secondary to NASH (nonalcoholic steatohepatitis) ([place]) dx nov 2014   had enlarged spleen also   . Low blood pressure   . Morbid obesity ([place])   . PONV (postoperative nausea and vomiting)    in past none recent  . Portal hypertension ([place])   . Shortness of breath dyspnea    history of with fluid overload  . Sleep apnea    uses cpap setting of 14  . Sore on scalp    may bleed secondary to ITP  . Spleen enlarged     Past Surgical History:  Procedure Laterality Date  . CESAREAN SECTION  1989  . CHOLECYSTECTOMY  20 yrs ago  . COLECTOMY  2011  . DILATATION & CURETTAGE/HYSTEROSCOPY WITH MYOSURE N/A 05/06/2016   Procedure: DILATATION & CURETTAGE/HYSTEROSCOPY WITH MYOSURE WITH POLYPECTOMY;  Surgeon: Janyth Pupa, DO;  Location: [facility];  Service: Gynecology;  Laterality: N/A;  . ESOPHAGOGASTRODUODENOSCOPY (EGD) WITH PROPOFOL N/A 09/04/2013   Procedure: ESOPHAGOGASTRODUODENOSCOPY (EGD) WITH PROPOFOL;  Surgeon: [name], MD;  Location: [facility];  Service: Endoscopy;  Laterality: N/A;  . KNEE ARTHROSCOPY  yrs ago   bilateral, one done x 1, one done twice    Social History: Patient lives with her husband.  The patient walks unassisted.  She is a remote former smoker (minimal history).  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   From [place] originally.  Allergies  Allergen Reactions  . Celecoxib Other (See Comments)    "speech slurred" with celebrex  . Prednisone Other (See Comments)    Rapid heart rate  . Shellfish Allergy Swelling     Lips tingle and swell. Whelps the size of quarters.   . Statins Other (See Comments)    aggravated fibromyalgia  . Ibuprofen     Due to platelets  . Tylenol [Acetaminophen]     Due to platelets    Family history: family history includes Cancer in her brother, father, and mother; Heart failure in her mother; Hyperlipidemia in her brother and mother; Lung cancer in her father; Stroke in her other.  Prior to Admission medications   Medication Sig Start Date End Date Taking? Authorizing Provider  benzonatate (TESSALON) 100 MG capsule Take 100 mg by mouth 3 (three) times daily. 10/24/16  Yes Historical Provider, MD  bumetanide (BUMEX) 2 MG tablet Take 2 tablets by mouth twice daily. Ok to take 2 tablets 3 times a day as needed for weight gain Patient taking differently: Take 4 mg by mouth 2 (two) times daily.  06/06/16  Yes Skeet Latch, MD  Coenzyme Q10 (CO Q 10) 100 MG CAPS Take 100 mg by mouth daily.   Yes Historical Provider, MD  cyanocobalamin 1000 MCG tablet Take 1,000 mcg by mouth daily.   Yes Historical Provider, MD  diltiazem (CARDIZEM CD) 240 MG 24 hr capsule TAKE 1 CAPSULE (240 MG TOTAL) BY MOUTH DAILY. 06/08/16  Yes Skeet Latch, MD  Ferrous Sulfate (IRON) 325 (65 FE) MG TABS Take 325 mg by mouth daily.    Yes Historical Provider, MD  Hydrocodone-Homatropine 5-1.5 MG TABS Take 1 tablet by mouth every 6 (six) hours. 10/29/16  Yes Historical Provider, MD  levalbuterol (XOPENEX) 1.25 MG/3ML nebulizer solution Take 1 ampule by nebulization every 6 (six) hours. 10/29/16  Yes Historical Provider, MD  methocarbamol (ROBAXIN) 500 MG tablet Take 250-750 mg by mouth daily as needed for muscle spasms.    Yes Historical Provider, MD  Potassium Chloride ER 20 MEQ TBCR TAKE 2 TABLETS BY MOUTH TWICE A DAY Patient taking differently: Take 40 mEq by mouth 2 (two) times daily.  06/06/16  Yes Skeet Latch, MD  propranolol (INDERAL) 10 MG tablet Take 1 tablet (10 mg total) by mouth 2 (two) times  daily. 06/06/16  Yes Skeet Latch, MD  spironolactone (ALDACTONE) 25 MG tablet Take 25 mg by mouth daily.  07/24/13  Yes Thurnell Lose, MD       Physical Exam: BP 123/64   Pulse 104   Temp 97.4 F (36.3 C) (Oral)   Resp 17   SpO2 93%  General appearance: Well-developed, obese adult female, alert and in mild distress from dyspnea, cough, malaise.   Eyes: Anicteric, conjunctiva pink, lids and lashes normal. PERRL.    ENT: No nasal deformity, discharge, epistaxis.  Hearing normal. OP moist with scant red petechiae on soft palate, no exudates.   Neck: No neck masses.  Trachea midline.  No thyromegaly/tenderness. Skin: Warm and dry.  No jaundice.  No suspicious rashes or lesions. Cardiac: Irregularly irregular, fast, nl S1-S2, no murmurs appreciated.  Capillary refill is brisk.  JVP not  visible.  1+ bilateral LE edema.  Radial pulses 2+ and symmetric. Respiratory: Tachypnea, diffuse wheezing with inspiration and expiration.  ?Crackles at both bases, or atelectasis, lung sounds distant. Abdomen: Abdomen soft.  No TTP. No ascites, distension.   MSK: No deformities or effusions.  No cyanosis or clubbing. Neuro: Cranial nerves normal.  Sensation intact to light touch. Speech is fluent.  Muscle strength normal.    Psych: Sensorium intact and responding to questions, attention normal.  Behavior appropriate.  Affect normal.  Judgment and insight appear normal.     Labs on Admission:  I have personally reviewed following labs and imaging studies: CBC:  Recent Labs Lab 10/31/16 1927  WBC 2.4*  NEUTROABS 2.1  HGB 10.0*  HCT 29.7*  MCV 89.5  PLT 20*   Basic Metabolic Panel:  Recent Labs Lab 10/31/16 1717  NA 136  K 3.5  CL 102  CO2 26  GLUCOSE 175*  BUN 16  CREATININE 0.87  CALCIUM 8.7*   GFR: CrCl cannot be calculated (Unknown ideal weight.).  Liver Function Tests:  Recent Labs Lab 10/31/16 1717  AST 33  ALT 17  ALKPHOS 61  BILITOT 2.5*  PROT 7.1  ALBUMIN  3.4*   No results for input(s): LIPASE, AMYLASE in the last 168 hours. No results for input(s): AMMONIA in the last 168 hours. Coagulation Profile: No results for input(s): INR, PROTIME in the last 168 hours. Cardiac Enzymes: No results for input(s): CKTOTAL, CKMB, CKMBINDEX, TROPONINI in the last 168 hours. BNP (last 3 results) No results for input(s): PROBNP in the last 8760 hours. HbA1C: No results for input(s): HGBA1C in the last 72 hours. CBG: No results for input(s): GLUCAP in the last 168 hours. Lipid Profile: No results for input(s): CHOL, HDL, LDLCALC, TRIG, CHOLHDL, LDLDIRECT in the last 72 hours. Thyroid Function Tests: No results for input(s): TSH, T4TOTAL, FREET4, T3FREE, THYROIDAB in the last 72 hours. Anemia Panel: No results for input(s): VITAMINB12, FOLATE, FERRITIN, TIBC, IRON, RETICCTPCT in the last 72 hours. Sepsis Labs: Invalid input(s): PROCALCITONIN, LACTICIDVEN No results found for this or any previous visit (from the past 240 hour(s)).       Radiological Exams on Admission: Personally reviewed CXR is poor quality given habitus, but ? Mild edema and suspect RLL opacity: Dg Chest 2 View  Result Date: 10/31/2016 CLINICAL DATA:  61 year old presenting with 2 week history of cough, headache, body aches and wheezing. EXAM: CHEST  2 VIEW COMPARISON:  10/29/2016, 01/26/2016 and earlier. FINDINGS: AP semi-erect and lateral images were obtained. Cardiac silhouette moderately to markedly enlarged, unchanged. Hilar and mediastinal contours otherwise unremarkable. Pulmonary venous hypertension without overt edema. New patchy airspace opacities in the right lower lobe. Lungs otherwise clear. No visible pleural effusions. Degenerative changes involving the thoracic spine. IMPRESSION: 1. Right lower lobe pneumonia. 2. Stable moderate to marked cardiomegaly. Pulmonary venous hypertension without overt edema. Electronically Signed   By: Evangeline Dakin M.D.   On: 10/31/2016 17:52     EKG: Independently reviewed. Rate 101, QTc 414, Afib, unchange from previous, no ST changes.  Echocardiogram 11/2015: EF 55-60% Mild LVH        Assessment/Plan  1. Community-acquired pneumonia of the right lower lobe:  -Ceftriaxone and azithromycin daily -Culture sputum -Check influenza swab -May have cough suppressants, acetaminophen for fever (cirrhosis limited dose)    2. Acute on chronic diastolic CHF:  Mildly congested on chest x-ray. -Furosemide once now IV -Continue home bumetanide and spironolactone -Continue potassium supplement -Strict ins and  outs -Daily weights -Monitor on telemetry  3. Chronic pancytopenia:  She follows with oncology, her severe chronic thrombocytopenia related to portal hypertension. She also has a history of long-standing leukopenia from her records.  No evidence of active bleeding. -Trend CBC  4. Cirrhosis:  Platelets and total bilirubin, roughly at baseline. Otherwise appears stable. -Continue Bumex and spironolactone tomorrow  5. Atrial fibrillation:  CHADS2-VASc 3, not on anticoagulation because of chronic severe thrombocytopenia. -Continue diltiazem and propranolol  6. Hypertension:  Well controlled at admission. -Continue diltiazem and propranolol  7. OSA:   8. Non-insulin dependent diabetes: Not on medications.  Last HgbA1c 7.5% -Check HgbA1c -SSI with meals  9. Other medications: -Continue Robaxin when necessary    DVT prophylaxis: SCDs  Code Status: FULL  Family Communication: Son at bedside  Disposition Plan: Anticipate IV antibiotics, diuresis and close monitoring Consults called: None Admission status: INPATIENT, tele         Medical decision making: Patient seen at 8:50 PM on 10/31/2016.  The patient was discussed with Dr, Tamera Punt.  What exists of the patient's chart was reviewed in depth and summarized above.  Clinical condition: stable from respiratory and ehmodynamic standpoint at this time,  mentating well.        Edwin Dada [facility] Pager [phone]    At the time of admission, it appears that the appropriate admission status for this patient is INPATIENT. This is judged to be reasonable and necessary in order to provide the required intensity of service to ensure the patient's safety given the presenting symptoms, physical exam findings, and initial radiographic and laboratory data in the context of their chronic comorbidities.  Together, these circumstances are felt to place her at high risk for further clinical deterioration threatening life, limb, or organ.   Patient requires inpatient status due to high intensity of service, high risk for further deterioration and high frequency of surveillance required because of this severe exacerbation of their chronic organ failure and acute illness that poses a threat to life, limb or bodily function.  Factors that support inpatient status include: Community-acquired pneumonia with right lower lobe with PSI score 120, class IV risk History of chronic severe thrombocytopenia, chronic anemia, chronic diastolic CHF with acute congestive heart failure History of atrial fibrillation, history of type 2 diabetes  I certify that at the point of admission it is my clinical judgment that the patient will require inpatient hospital care spanning beyond 2 midnights from the point of admission and that early discharge would result in unnecessary risk of decompensation and readmission or threat to life, limb or bodily function.

## 2016-10-31 NOTE — ED Triage Notes (Signed)
Pt c/o headache, cough, body aches x 2 weeks, diagnosed with bronchitis, symptoms progressively worse, worse with exertion. Fever 101 on Saturday. Wheezing audible across room.

## 2016-10-31 NOTE — ED Notes (Signed)
Bed: WA07 Expected date:  Expected time:  Means of arrival:  Comments: Waiting for Dr. Ashok Cordia.

## 2016-10-31 NOTE — ED Provider Notes (Signed)
Hannah Khan DEPT Provider Note   CSN: [id number] Arrival date & time: 10/31/16  1548     History   Chief Complaint Chief Complaint  Patient presents with  . Shortness of Breath    HPI Hannah Khan is a 61 y.o. female.  Pt is a 61yo female with hx of a-fib, HTN, fibromyalgia, liver cirrhosis 2nd to NASH who presents with wheezing and SOB.  For 2 weeks, has had cough productive of green sputum, fevers for last 2 days.  +worsening SOB with associated wheezing for last few days.  Was seen at an urgent care last weekend and started on albuterol nebs.  Saw PCP today and was sent over here for evaluation.  Has been using nebs every 4-6 hours without relief.  Got albuterol 32m, atrovent, solumedrol per EMS.  Has intermittent edema chronically, currently unchanged from baseline.  Hasn't taken her diuretics for last few days due to her difficulty walking to the bathroom      Past Medical History:  Diagnosis Date  . Atrial fibrillation ([place]   . Blood dyscrasia   . Diabetes mellitus without complication ([place]   . Dysrhythmia   . Fibromyalgia   . HTN (hypertension)   . Hx of colonic polyps    s/p partial colectomy  . Hyperlipidemia   . ITP (idiopathic thrombocytopenic purpura)   . Left leg swelling    wear compression hose  . Liver cirrhosis secondary to NASH (nonalcoholic steatohepatitis) ([place] dx nov 2014   had enlarged spleen also   . Low blood pressure   . Morbid obesity ([place]   . PONV (postoperative nausea and vomiting)    in past none recent  . Portal hypertension ([place]   . Shortness of breath dyspnea    history of with fluid overload  . Sleep apnea    uses cpap setting of 14  . Sore on scalp    may bleed secondary to ITP  . Spleen enlarged     Patient Active Problem List   Diagnosis Date Noted  . Community acquired pneumonia of right lower lobe of lung ([place] 10/31/2016  . Muscular abdominal pain in right upper quadrant 07/11/2016  . Chronic diastolic CHF  (congestive heart failure) ([place] 07/10/2016  . Thrombocytopenia ([place] 07/10/2016  . Permanent atrial fibrillation ([place] 01/30/2016  . Type 2 diabetes mellitus without complication, without long-term current use of insulin ([facility] 01/30/2016  . Acute on chronic diastolic CHF (congestive heart failure), NYHA class 2 ([place] 01/26/2016  . Morbid obesity- BMI 56 12/10/2015  . Melena 01/31/2015  . Aortic heart murmur 01/31/2015  . Cirrhosis of liver, with portal HTN & Acites, by CT Abdomen 07/20/13 07/23/2013  . Pancytopenia ([place] 07/23/2013  . Obstructive sleep apnea 08/07/2007  . Essential hypertension 08/07/2007  . ALLERGIC RHINITIS 08/07/2007    Past Surgical History:  Procedure Laterality Date  . CESAREAN SECTION  1989  . CHOLECYSTECTOMY  20 yrs ago  . COLECTOMY  2011  . DILATATION & CURETTAGE/HYSTEROSCOPY WITH MYOSURE N/A 05/06/2016   Procedure: DILATATION & CURETTAGE/HYSTEROSCOPY WITH MYOSURE WITH POLYPECTOMY;  Surgeon: JJanyth Pupa DO;  [facility];  Service: Gynecology;  Laterality: N/A;  . ESOPHAGOGASTRODUODENOSCOPY (EGD) WITH PROPOFOL N/A 09/04/2013   Procedure: ESOPHAGOGASTRODUODENOSCOPY (EGD) WITH PROPOFOL;  Surgeon: WArta Silence MD;  Location[facility];  Service: Endoscopy;  Laterality: N/A;  . KNEE ARTHROSCOPY  yrs ago   bilateral, one done x 1, one done twice    OB History    No data available  Home Medications    Prior to Admission medications   Medication Sig Start Date End Date Taking? Authorizing Provider  benzonatate (TESSALON) 100 MG capsule Take 100 mg by mouth 3 (three) times daily. 10/24/16  Yes Historical Provider, MD  bumetanide (BUMEX) 2 MG tablet Take 2 tablets by mouth twice daily. Ok to take 2 tablets 3 times a day as needed for weight gain Patient taking differently: Take 4 mg by mouth 2 (two) times daily.  06/06/16  Yes Skeet Latch, MD  Coenzyme Q10 (CO Q 10) 100 MG CAPS Take 100 mg by mouth daily.   Yes Historical Provider, MD    cyanocobalamin 1000 MCG tablet Take 1,000 mcg by mouth daily.   Yes Historical Provider, MD  diltiazem (CARDIZEM CD) 240 MG 24 hr capsule TAKE 1 CAPSULE (240 MG TOTAL) BY MOUTH DAILY. 06/08/16  Yes Skeet Latch, MD  Ferrous Sulfate (IRON) 325 (65 FE) MG TABS Take 325 mg by mouth daily.    Yes Historical Provider, MD  Hydrocodone-Homatropine 5-1.5 MG TABS Take 1 tablet by mouth every 6 (six) hours. 10/29/16  Yes Historical Provider, MD  levalbuterol (XOPENEX) 1.25 MG/3ML nebulizer solution Take 1 ampule by nebulization every 6 (six) hours. 10/29/16  Yes Historical Provider, MD  methocarbamol (ROBAXIN) 500 MG tablet Take 250-750 mg by mouth daily as needed for muscle spasms.    Yes Historical Provider, MD  Potassium Chloride ER 20 MEQ TBCR TAKE 2 TABLETS BY MOUTH TWICE A DAY Patient taking differently: Take 40 mEq by mouth 2 (two) times daily.  06/06/16  Yes Skeet Latch, MD  propranolol (INDERAL) 10 MG tablet Take 1 tablet (10 mg total) by mouth 2 (two) times daily. 06/06/16  Yes Skeet Latch, MD  spironolactone (ALDACTONE) 25 MG tablet Take 25 mg by mouth daily.  07/24/13  Yes Thurnell Lose, MD    Family History Family History  Problem Relation Age of Onset  . Heart failure Mother   . Cancer Mother   . Hyperlipidemia Mother   . Lung cancer Father   . Cancer Father   . Cancer Brother   . Hyperlipidemia Brother   . Stroke Other     Social History Social History  Substance Use Topics  . Smoking status: Former Smoker    Packs/day: 1.00    Years: 3.00    Types: Cigarettes    Quit date: 09/19/1974  . Smokeless tobacco: Never Used  . Alcohol use 0.6 oz/week    1 Standard drinks or equivalent per week     Comment: occ     Allergies   Celecoxib; Prednisone; Shellfish allergy; Statins; Ibuprofen; and Tylenol [acetaminophen]   Review of Systems Review of Systems  Constitutional: Positive for fever. Negative for chills, diaphoresis and fatigue.  HENT: Negative for  congestion, rhinorrhea and sneezing.   Eyes: Negative.   Respiratory: Positive for cough, shortness of breath and wheezing. Negative for chest tightness.   Cardiovascular: Negative for chest pain and leg swelling.  Gastrointestinal: Negative for abdominal pain, blood in stool, diarrhea, nausea and vomiting.  Genitourinary: Negative for difficulty urinating, flank pain, frequency and hematuria.  Musculoskeletal: Negative for arthralgias and back pain.  Skin: Negative for rash.  Neurological: Negative for dizziness, speech difficulty, weakness, numbness and headaches.     Physical Exam Updated Vital Signs BP 124/72 (BP Location: Right Arm)   Pulse (!) 105   Temp 97.8 F (36.6 C) (Oral)   Resp (!) 24   Ht 5' 7"  (1.702 m)  Wt (!) 368 lb 1.6 oz (167 kg)   SpO2 98%   BMI 57.65 kg/m   Physical Exam  Constitutional: She is oriented to person, place, and time. She appears well-developed and well-nourished.  HENT:  Head: Normocephalic and atraumatic.  Eyes: Pupils are equal, round, and reactive to light.  Neck: Normal range of motion. Neck supple.  Cardiovascular: Normal rate, regular rhythm and normal heart sounds.   Pulmonary/Chest: Effort normal. No respiratory distress. She has wheezes. She has no rales. She exhibits no tenderness.  Sat 90% on RA, placed on [place]  Abdominal: Soft. Bowel sounds are normal. There is no tenderness. There is no rebound and no guarding.  Musculoskeletal: Normal range of motion. She exhibits no edema.  1+pitting edema bilaterally  Lymphadenopathy:    She has no cervical adenopathy.  Neurological: She is alert and oriented to person, place, and time.  Skin: Skin is warm and dry. No rash noted.  Psychiatric: She has a normal mood and affect.     ED Treatments / Results  Labs (all labs ordered are listed, but only abnormal results are displayed) Labs Reviewed  BRAIN NATRIURETIC PEPTIDE - Abnormal; Notable for the following:       Result Value   B  Natriuretic Peptide 232.8 (*)    All other components within normal limits  COMPREHENSIVE METABOLIC PANEL - Abnormal; Notable for the following:    Glucose, Bld 175 (*)    Calcium 8.7 (*)    Albumin 3.4 (*)    Total Bilirubin 2.5 (*)    All other components within normal limits  CBC WITH DIFFERENTIAL/PLATELET - Abnormal; Notable for the following:    WBC 2.4 (*)    RBC 3.32 (*)    Hemoglobin 10.0 (*)    HCT 29.7 (*)    Platelets 20 (*)    Lymphs Abs 0.2 (*)    All other components within normal limits  GLUCOSE, CAPILLARY - Abnormal; Notable for the following:    Glucose-Capillary 281 (*)    All other components within normal limits  CULTURE, EXPECTORATED SPUTUM-ASSESSMENT  INFLUENZA PANEL BY PCR (TYPE A & B)  CBC  BASIC METABOLIC PANEL  HEMOGLOBIN A1C  I-STAT TROPOININ, ED    EKG  EKG Interpretation  Date/Time:  Monday October 31 2016 16:12:37 EST Ventricular Rate:  101 PR Interval:    QRS Duration: 97 QT Interval:  319 QTC Calculation: 414 R Axis:   -2 Text Interpretation:  Atrial fibrillation Low voltage, precordial leads Abnormal R-wave progression, early transition No significant change since last tracing Confirmed by Winfred Leeds  MD, SAM [phone]) on 10/31/2016 4:17:07 PM       Radiology Dg Chest 2 View  Result Date: 10/31/2016 CLINICAL DATA:  61 year old presenting with 2 week history of cough, headache, body aches and wheezing. EXAM: CHEST  2 VIEW COMPARISON:  10/29/2016, 01/26/2016 and earlier. FINDINGS: AP semi-erect and lateral images were obtained. Cardiac silhouette moderately to markedly enlarged, unchanged. Hilar and mediastinal contours otherwise unremarkable. Pulmonary venous hypertension without overt edema. New patchy airspace opacities in the right lower lobe. Lungs otherwise clear. No visible pleural effusions. Degenerative changes involving the thoracic spine. IMPRESSION: 1. Right lower lobe pneumonia. 2. Stable moderate to marked cardiomegaly. Pulmonary  venous hypertension without overt edema. Electronically Signed   By: Evangeline Dakin M.D.   On: 10/31/2016 17:52    Procedures Procedures (including critical care time)  Medications Ordered in ED Medications  benzonatate (TESSALON) capsule 100 mg (100 mg Oral Given  10/31/16 2336)  diltiazem (CARDIZEM CD) 24 hr capsule 240 mg (not administered)  propranolol (INDERAL) tablet 10 mg (10 mg Oral Given 10/31/16 2335)  spironolactone (ALDACTONE) tablet 25 mg (not administered)  methocarbamol (ROBAXIN) tablet 250-750 mg (not administered)  acetaminophen (TYLENOL) tablet 650 mg (not administered)    Or  acetaminophen (TYLENOL) suppository 650 mg (not administered)  ondansetron (ZOFRAN) tablet 4 mg (not administered)    Or  ondansetron (ZOFRAN) injection 4 mg (not administered)  levalbuterol (XOPENEX) nebulizer solution 0.63 mg (0.63 mg Nebulization Given 10/31/16 2311)  bumetanide (BUMEX) tablet 4 mg (not administered)  insulin aspart (novoLOG) injection 0-9 Units (not administered)  insulin aspart (novoLOG) injection 0-5 Units (3 Units Subcutaneous Given 10/31/16 2333)  cefTRIAXone (ROCEPHIN) 1 g in dextrose 5 % 50 mL IVPB (not administered)  azithromycin (ZITHROMAX) tablet 500 mg (not administered)  potassium chloride SA (K-DUR,KLOR-CON) CR tablet 40 mEq (40 mEq Oral Given 10/31/16 2334)  HYDROcodone-homatropine (HYCODAN) 5-1.5 MG/5ML syrup 5 mL (not administered)  levalbuterol (XOPENEX) nebulizer solution 1.25 mg (not administered)  ipratropium (ATROVENT) nebulizer solution 0.5 mg (not administered)  cefTRIAXone (ROCEPHIN) 1 g in dextrose 5 % 50 mL IVPB (0 g Intravenous Stopped 10/31/16 2004)  azithromycin (ZITHROMAX) 500 mg in dextrose 5 % 250 mL IVPB (0 mg Intravenous Stopped 10/31/16 2117)  ipratropium (ATROVENT) nebulizer solution 0.5 mg (0.5 mg Nebulization Given 10/31/16 1915)  levalbuterol (XOPENEX) nebulizer solution 0.63 mg (0.63 mg Nebulization Given 10/31/16 1916)  furosemide (LASIX)  injection 40 mg (40 mg Intravenous Given 10/31/16 2335)     Initial Impression / Assessment and Plan / ED Course  I have reviewed the triage vital signs and the nursing notes.  Pertinent labs & imaging results that were available during my care of the patient were reviewed by me and considered in my medical decision making (see chart for details).     Patient presents with a two-week history of wheezing and shortness of breath. She has evidence of pneumonia on x-ray. She's had nebulizer treatments by EMS and in the ED and she's better but still to And requiring oxygen. She is on a nasal cannula and is doing fine on a nasal cannula. I will consult the hospitalist for admission.    Final Clinical Impressions(s) / ED Diagnoses   Final diagnoses:  Exacerbation of intermittent asthma, unspecified asthma severity  Community acquired pneumonia of right lower lobe of lung [facility])    New Prescriptions Current Discharge Medication List       Malvin Johns, MD 11/01/16 0130

## 2016-11-01 DIAGNOSIS — J9601 Acute respiratory failure with hypoxia: Secondary | ICD-10-CM | POA: Diagnosis present

## 2016-11-01 DIAGNOSIS — E119 Type 2 diabetes mellitus without complications: Secondary | ICD-10-CM

## 2016-11-01 LAB — EXPECTORATED SPUTUM ASSESSMENT W GRAM STAIN, RFLX TO RESP C

## 2016-11-01 LAB — GLUCOSE, CAPILLARY
GLUCOSE-CAPILLARY: 180 mg/dL — AB (ref 65–99)
Glucose-Capillary: 184 mg/dL — ABNORMAL HIGH (ref 65–99)
Glucose-Capillary: 134 mg/dL — ABNORMAL HIGH (ref 65–99)
Glucose-Capillary: 114 mg/dL — ABNORMAL HIGH (ref 65–99)

## 2016-11-01 LAB — BASIC METABOLIC PANEL
Anion gap: 8 (ref 5–15)
BUN: 17 mg/dL (ref 6–20)
CHLORIDE: 104 mmol/L (ref 101–111)
CO2: 24 mmol/L (ref 22–32)
CREATININE: 0.8 mg/dL (ref 0.44–1.00)
Calcium: 8.6 mg/dL — ABNORMAL LOW (ref 8.9–10.3)
GFR calc Af Amer: >60 mL/min (ref >60)
GFR calc non Af Amer: >60 mL/min (ref >60)
Glucose, Bld: 198 mg/dL — ABNORMAL HIGH (ref 65–99)
POTASSIUM: 3.7 mmol/L (ref 3.5–5.1)
SODIUM: 136 mmol/L (ref 135–145)

## 2016-11-01 LAB — CBC
HCT: 28.3 % — ABNORMAL LOW (ref 36.0–46.0)
HEMOGLOBIN: 9.5 g/dL — AB (ref 12.0–15.0)
MCH: 29.5 pg (ref 26.0–34.0)
MCHC: 33.6 g/dL (ref 30.0–36.0)
MCV: 87.9 fL (ref 78.0–100.0)
Platelets: 13 10*3/uL — CL (ref 150–400)
RBC: 3.22 MIL/uL — AB (ref 3.87–5.11)
RDW: 14.3 % (ref 11.5–15.5)
WBC: 1.5 10*3/uL — ABNORMAL LOW (ref 4.0–10.5)

## 2016-11-01 LAB — EXPECTORATED SPUTUM ASSESSMENT W REFEX TO RESP CULTURE

## 2016-11-01 MED ORDER — SODIUM CHLORIDE 0.9 % IV SOLN
INTRAVENOUS | Status: DC | PRN
  Administered 2016-11-01: 17:00:00 via INTRAVENOUS

## 2016-11-01 NOTE — Care Management Note (Signed)
Case Management Note  Patient Details  Name: Hannah Khan MRN: [id number] Date of Birth: [birth year]  Subjective/Objective: 61 y/o f admitted w/CAP. From home.                   Action/Plan:d/c home.   Expected Discharge Date:   (unknown)               Expected Discharge Plan:  Home/Self Care  In-House Referral:     Discharge planning Services  CM Consult  Post Acute Care Choice:    Choice offered to:     DME Arranged:    DME Agency:     HH Arranged:    HH Agency:     Status of Service:  In process, will continue to follow  If discussed at Long Length of Stay Meetings, dates discussed:    Additional Comments:  Dessa Phi, RN 11/01/2016, 1:05 PM

## 2016-11-01 NOTE — Progress Notes (Signed)
PROGRESS NOTE  Hannah Khan  PJS:[id number] DOB: Sep 18, 1956 DOA: 10/31/2016 PCP: Lujean Amel, MD   Brief Narrative:  Hannah Khan is a 61 y.o. female with a history of cryptogenic/NASH cirrhosis c/b severe chronic thrombocytopenia, NIDDM, HTN, HFpEF, and Afib not on warfarin who presented with productive cough and fevers.  She'd had several weeks of URI symptoms significantly worsening over the past few days with HA, myalgias, fever to 101F, worsening now productive cough, and wheezing. She went to UC on Saturday where she was prescribed Hycodan and Xopenex for bronchitis.  Her husband around that time was prescribed Tamiflu for influenza. Due to no improvement she presented to her PCP and was sent to the ED for concern of pneumonia.  On arrival she was hypoxic, dipping into 80%'s at rest, tachycardic and tachypneic. WBC 2.4K, Hgb 10 (baseline around 11), platelets 20K (baseline <50K). TBili 2.5 (near baseline). BNP 232. CXR showed vascular congestion and RLL opacity. She was given ceftriaxone and azithromycin and admitted for acute hypoxia due to pneumonia.   Assessment & Plan: Principal Problem:   Community acquired pneumonia of right lower lobe of lung ([place]) Active Problems:   Obstructive sleep apnea   Essential hypertension   Cirrhosis of liver, with portal HTN & Acites, by CT Abdomen 07/20/13   Pancytopenia ([place])   Acute on chronic diastolic CHF (congestive heart failure), NYHA class 2 (HCC)   Permanent atrial fibrillation ([place])   Type 2 diabetes mellitus without complication, without long-term current use of insulin ([place])  Sepsis due to community-acquired pneumonia of the right lower lobe: Tachypneic, tachycardic, hypoxemic with infiltrate on CXR at admission. Since improving. Flu negative.   - Continue ceftriaxone and azithromycin - Monitor cultures - Anti-tussives and tylenol (lower dose) for fever.   Acute hypoxemic respiratory failure: Due to CAP, sepsis. Pulmonary  function also compromised by CHF, OSA/OHS.  - Oxygen to maintain saturations >89%  Acute on chronic diastolic CHF: Mildly congested on chest x-ray without crackles. Given IV lasix at admission. - Continue home bumetanide and spironolactone with home potassium supplement - Strict I/O, daily weights  Chronic pancytopenia: She follows with oncology, her severe chronic thrombocytopenia related to portal hypertension. She also has a history of long-standing leukopenia from her records.  No evidence of active bleeding. - Trend CBC  Cirrhosis, cryptogenic vs. due to NASH: Platelets and total bilirubin, roughly at baseline. Otherwise appears stable. - Continue Bumex and spironolactone   Atrial fibrillation: CHADS2-VASc 3, not on anticoagulation because of chronic severe thrombocytopenia. - Continue diltiazem and propranolol  Hypertension: Chronic, stable - Continue diltiazem and propranolol  OSA: Chronic, stable - CPAP qHS (brought hers from home)  Non-insulin dependent diabetes: Not on medications. Last HgbA1c 7.5%  - Check HgbA1c - SSI with meals  DVT prophylaxis: SCD's Code Status: Full Family Communication: None at bedside this AM Disposition Plan: Continue treatment of CAP with acute hypoxemic respiratory failure.   Consultants:   None  Procedures:   CPAP qHS  Antimicrobials:  Ceftriaxone (2/12 >> )  Azithromycin (2/12 >> )   Subjective: Pt mildly improved since admission last night, still dyspneic with any exertion at all. Denies leg swelling, orthopnea. No chest pain or palpitations.   Objective: Vitals:   10/31/16 2045 10/31/16 2100 10/31/16 2158 11/01/16 0650  BP:  120/60 124/72 (!) 107/49  Pulse: 104  (!) 105 89  Resp: 17 24 (!) 24 20  Temp:   97.8 F (36.6 C) 98 F (36.7 C)  TempSrc:  Oral Oral  SpO2: 93%  98% 97%  Weight:   (!) 167 kg (368 lb 1.6 oz) (!) 165.7 kg (365 lb 4.8 oz)  Height:   5' 7"  (1.702 m)     Intake/Output Summary (Last 24  hours) at 11/01/16 0736 Last data filed at 11/01/16 0443  Gross per 24 hour  Intake              240 ml  Output             1600 ml  Net            -1360 ml   Filed Weights   10/31/16 2158 11/01/16 0650  Weight: (!) 167 kg (368 lb 1.6 oz) (!) 165.7 kg (365 lb 4.8 oz)    Examination: General exam: Obese, pleasant 61 y.o. female in no distress Respiratory system: Non-labored, tachypneic breathing 1.5L by [place]. Distant breath sounds with diffuse expiratory wheezing. No crackles.  Cardiovascular system: Irreg irreg. No murmur, rub, or gallop. No JVD, and trace pedal edema. Gastrointestinal system: Abdomen soft, non-tender, non-distended, with normoactive bowel sounds. No organomegaly or masses felt. Central nervous system: Alert and oriented. No focal neurological deficits. Extremities: Warm, no deformities Skin: No rashes, lesions no ulcers Psychiatry: Judgement and insight appear normal. Mood & affect appropriate.   Data Reviewed: I have personally reviewed following labs and imaging studies  CBC:  Recent Labs Lab 10/31/16 1927 11/01/16 0515  WBC 2.4* 1.5*  NEUTROABS 2.1  --   HGB 10.0* 9.5*  HCT 29.7* 28.3*  MCV 89.5 87.9  PLT 20* 13*   Basic Metabolic Panel:  Recent Labs Lab 10/31/16 1717 11/01/16 0515  NA 136 136  K 3.5 3.7  CL 102 104  CO2 26 24  GLUCOSE 175* 198*  BUN 16 17  CREATININE 0.87 0.80  CALCIUM 8.7* 8.6*   GFR: Estimated Creatinine Clearance: 121.8 mL/min (by C-G formula based on SCr of 0.8 mg/dL). Liver Function Tests:  Recent Labs Lab 10/31/16 1717  AST 33  ALT 17  ALKPHOS 61  BILITOT 2.5*  PROT 7.1  ALBUMIN 3.4*   No results for input(s): LIPASE, AMYLASE in the last 168 hours. No results for input(s): AMMONIA in the last 168 hours. Coagulation Profile: No results for input(s): INR, PROTIME in the last 168 hours. Cardiac Enzymes: No results for input(s): CKTOTAL, CKMB, CKMBINDEX, TROPONINI in the last 168 hours. BNP (last 3  results) No results for input(s): PROBNP in the last 8760 hours. HbA1C: No results for input(s): HGBA1C in the last 72 hours. CBG:  Recent Labs Lab 10/31/16 2207 11/01/16 0711  GLUCAP 281* 180*   Lipid Profile: No results for input(s): CHOL, HDL, LDLCALC, TRIG, CHOLHDL, LDLDIRECT in the last 72 hours. Thyroid Function Tests: No results for input(s): TSH, T4TOTAL, FREET4, T3FREE, THYROIDAB in the last 72 hours. Anemia Panel: No results for input(s): VITAMINB12, FOLATE, FERRITIN, TIBC, IRON, RETICCTPCT in the last 72 hours. Urine analysis:    Component Value Date/Time   COLORURINE AMBER (A) 07/10/2016 1400   APPEARANCEUR CLEAR 07/10/2016 1400   LABSPEC 1.030 07/10/2016 1400   PHURINE 6.0 07/10/2016 1400   GLUCOSEU NEGATIVE 07/10/2016 1400   HGBUR TRACE (A) 07/10/2016 1400   BILIRUBINUR NEGATIVE 07/10/2016 1400   KETONESUR NEGATIVE 07/10/2016 1400   PROTEINUR NEGATIVE 07/10/2016 1400   UROBILINOGEN 2.0 (H) 01/31/2015 0055   NITRITE NEGATIVE 07/10/2016 1400   LEUKOCYTESUR NEGATIVE 07/10/2016 1400   Recent Results (from the past 240 hour(s))  Culture, sputum-assessment  Status: None   Collection Time: 10/31/16  4:43 AM  Result Value Ref Range Status   Specimen Description SPUTUM  Final   Special Requests NONE  Final   Sputum evaluation   Final    Sputum specimen not acceptable for testing.  Please recollect.   Remigio Eisenmenger CERIC,RN [id number] @ 0525 BY J SCOTTON    Report Status 11/01/2016 FINAL  Final      Radiology Studies: Dg Chest 2 View  Result Date: 10/31/2016 CLINICAL DATA:  61 year old presenting with 2 week history of cough, headache, body aches and wheezing. EXAM: CHEST  2 VIEW COMPARISON:  10/29/2016, 01/26/2016 and earlier. FINDINGS: AP semi-erect and lateral images were obtained. Cardiac silhouette moderately to markedly enlarged, unchanged. Hilar and mediastinal contours otherwise unremarkable. Pulmonary venous hypertension without overt edema. New patchy  airspace opacities in the right lower lobe. Lungs otherwise clear. No visible pleural effusions. Degenerative changes involving the thoracic spine. IMPRESSION: 1. Right lower lobe pneumonia. 2. Stable moderate to marked cardiomegaly. Pulmonary venous hypertension without overt edema. Electronically Signed   By: Evangeline Dakin M.D.   On: 10/31/2016 17:52    Scheduled Meds: . azithromycin  500 mg Oral Q24H  . bumetanide  4 mg Oral BID  . cefTRIAXone (ROCEPHIN)  IV  1 g Intravenous Q24H  . diltiazem  240 mg Oral Daily  . insulin aspart  0-5 Units Subcutaneous QHS  . insulin aspart  0-9 Units Subcutaneous TID WC  . ipratropium  0.5 mg Nebulization TID  . levalbuterol  1.25 mg Nebulization TID  . potassium chloride  40 mEq Oral BID  . propranolol  10 mg Oral BID  . spironolactone  25 mg Oral Daily   Continuous Infusions:   LOS: 1 day   Time spent: 25 minutes.  Vance Gather, MD [facility] Pager [phone]  If 7PM-7AM, please contact night-coverage [email or web address] Password [facility] 11/01/2016, 7:36 AM

## 2016-11-01 NOTE — Evaluation (Signed)
Physical Therapy Evaluation Patient Details Name: Hannah Khan MRN: [id number] DOB: 1955-12-09 Today's Date: 11/01/2016   History of Present Illness  61 yo female admitted with Pna. Hx of Afib, HTN, fibromyalgia, liver cirrhosis-NASH, DM, morbid obesity.  Clinical Impression  On eval, pt was Min guard assist for mobility. She walked ~115 feet in the hallway. No LOB but pt is mildly unsteady. At baseline, she uses a cane for ambulation safety. O2 sats 94% on RA during ambulation, dyspnea 2/4. Do not anticipate any follow up PT services at discharge. Recommend daily ambulation in the hallway with nursing supervision.     Follow Up Recommendations No PT follow up    Equipment Recommendations  None recommended by PT    Recommendations for Other Services       Precautions / Restrictions Precautions Precaution Comments: monitor O2 sats Restrictions Weight Bearing Restrictions: No      Mobility  Bed Mobility               General bed mobility comments: oob in recliner  Transfers Overall transfer level: Modified independent   Transfers: Sit to/from Stand Sit to Stand: Modified independent (Device/Increase time)            Ambulation/Gait Ambulation/Gait assistance: Min guard Ambulation Distance (Feet): 115 Feet Assistive device: None (hallway handrail intermittently) Gait Pattern/deviations: Step-through pattern     General Gait Details: close guard for safety. No LOB. O2 sats 94% on RA during ambulation.  Stairs            Wheelchair Mobility    Modified Rankin (Stroke Patients Only)       Balance                                             Pertinent Vitals/Pain Pain Assessment: No/denies pain    Home Living Family/patient expects to be discharged to:: Private residence Living Arrangements: Spouse/significant other   Type of Home: [place]: [place]: [place] - single point      Prior  Function Level of Independence: Independent with assistive device(s)         Comments: uses cane for ambulation. pt works at [employer]        Extremity/Trunk Assessment   Upper Extremity Assessment Upper Extremity Assessment: Overall WFL for tasks assessed    Lower Extremity Assessment Lower Extremity Assessment: Generalized weakness    Cervical / Trunk Assessment Cervical / Trunk Assessment: Normal  Communication   Communication: No difficulties  Cognition Arousal/Alertness: Awake/alert Behavior During Therapy: WFL for tasks assessed/performed Overall Cognitive Status: Within Functional Limits for tasks assessed                      General Comments      Exercises     Assessment/Plan    PT Assessment Patient needs continued PT services  PT Problem List Decreased mobility;Obesity;Decreased activity tolerance          PT Treatment Interventions Gait training;Therapeutic activities;Therapeutic exercise;Patient/family education;Functional mobility training    PT Goals (Current goals can be found in the Care Plan section)  Acute Rehab PT Goals Patient Stated Goal: none stated PT Goal Formulation: With patient Time For Goal Achievement: 11/15/16 Potential to Achieve Goals: Good    Frequency Min 3X/week  Barriers to discharge        Co-evaluation               End of Session   Activity Tolerance: Patient tolerated treatment well Patient left: in chair;with call bell/phone within reach           Time: 1735-6701 PT Time Calculation (min) (ACUTE ONLY): 8 min   Charges:   PT Evaluation $PT Eval Low Complexity: 1 Procedure     PT G Codes:        Weston Anna, MPT Pager: [phone]

## 2016-11-01 NOTE — Progress Notes (Signed)
Pt will have RN contact RT when she is ready for her CPAP.

## 2016-11-02 ENCOUNTER — Encounter: Payer: Self-pay | Admitting: Family Medicine

## 2016-11-02 DIAGNOSIS — J181 Lobar pneumonia, unspecified organism: Secondary | ICD-10-CM

## 2016-11-02 LAB — CBC
HEMATOCRIT: 31.4 % — AB (ref 36.0–46.0)
Hemoglobin: 10.6 g/dL — ABNORMAL LOW (ref 12.0–15.0)
MCH: 30.5 pg (ref 26.0–34.0)
MCHC: 33.8 g/dL (ref 30.0–36.0)
MCV: 90.5 fL (ref 78.0–100.0)
PLATELETS: 22 10*3/uL — AB (ref 150–400)
RBC: 3.47 MIL/uL — ABNORMAL LOW (ref 3.87–5.11)
RDW: 14.7 % (ref 11.5–15.5)
WBC: 3.5 10*3/uL — ABNORMAL LOW (ref 4.0–10.5)

## 2016-11-02 LAB — BASIC METABOLIC PANEL
Anion gap: 9 (ref 5–15)
BUN: 21 mg/dL — ABNORMAL HIGH (ref 6–20)
CALCIUM: 8.8 mg/dL — AB (ref 8.9–10.3)
CO2: 28 mmol/L (ref 22–32)
CREATININE: 0.97 mg/dL (ref 0.44–1.00)
Chloride: 103 mmol/L (ref 101–111)
GFR calc non Af Amer: >60 mL/min (ref >60)
Glucose, Bld: 130 mg/dL — ABNORMAL HIGH (ref 65–99)
Potassium: 3.3 mmol/L — ABNORMAL LOW (ref 3.5–5.1)
Sodium: 140 mmol/L (ref 135–145)

## 2016-11-02 LAB — MAGNESIUM: MAGNESIUM: 1.8 mg/dL (ref 1.7–2.4)

## 2016-11-02 LAB — EXPECTORATED SPUTUM ASSESSMENT W REFEX TO RESP CULTURE

## 2016-11-02 LAB — HEMOGLOBIN A1C
Hgb A1c MFr Bld: 5.8 % — ABNORMAL HIGH (ref 4.8–5.6)
Mean Plasma Glucose: 120 mg/dL

## 2016-11-02 LAB — GLUCOSE, CAPILLARY
Glucose-Capillary: 115 mg/dL — ABNORMAL HIGH (ref 65–99)
Glucose-Capillary: 136 mg/dL — ABNORMAL HIGH (ref 65–99)

## 2016-11-02 LAB — EXPECTORATED SPUTUM ASSESSMENT W GRAM STAIN, RFLX TO RESP C

## 2016-11-02 MED ORDER — DEXTROSE 5 % IV SOLN
1.0000 g | Freq: Once | INTRAVENOUS | Status: AC
  Administered 2016-11-02: 1 g via INTRAVENOUS
  Filled 2016-11-02: qty 10

## 2016-11-02 MED ORDER — MAGNESIUM OXIDE 400 (241.3 MG) MG PO TABS
400.0000 mg | ORAL_TABLET | Freq: Every day | ORAL | Status: DC
  Administered 2016-11-02: 400 mg via ORAL
  Filled 2016-11-02: qty 1

## 2016-11-02 MED ORDER — CEFUROXIME AXETIL 500 MG PO TABS: 500.0000 mg | ORAL_TABLET | Freq: Two times a day (BID) | ORAL | 0 refills | Status: DC

## 2016-11-02 MED ORDER — BENZONATATE 100 MG PO CAPS: 100.0000 mg | ORAL_CAPSULE | Freq: Three times a day (TID) | ORAL | 0 refills | Status: DC

## 2016-11-02 MED ORDER — DEXTROSE 5 % IV SOLN: 2.0000 g | INTRAVENOUS | Status: DC

## 2016-11-02 MED ORDER — AMOXICILLIN-POT CLAVULANATE 875-125 MG PO TABS: 1.0000 | ORAL_TABLET | Freq: Two times a day (BID) | ORAL | Status: DC

## 2016-11-02 MED ORDER — CEFUROXIME AXETIL 500 MG PO TABS
500.0000 mg | ORAL_TABLET | Freq: Two times a day (BID) | ORAL | Status: DC
  Filled 2016-11-02: qty 1

## 2016-11-02 MED ORDER — HYDROCODONE-HOMATROPINE 5-1.5 MG PO TABS
1.0000 | ORAL_TABLET | Freq: Three times a day (TID) | ORAL | 0 refills | Status: DC
Start: 2016-11-02 — End: 2016-12-02

## 2016-11-02 MED ORDER — CEFTRIAXONE SODIUM 2 G IJ SOLR: 2.0000 g | INTRAMUSCULAR | Status: DC

## 2016-11-02 MED ORDER — AZITHROMYCIN 250 MG PO TABS: 500.0000 mg | ORAL_TABLET | ORAL | Status: DC

## 2016-11-02 NOTE — Care Management Note (Signed)
Case Management Note  Patient Details  Name: [name] MRN: [id number] Date of Birth: Feb 16, 1956  Subjective/Objective: Noted d/c order.Nurse to check 02 sats to confirm if qualifies for home 02-if qualifies will need home 02 order.                   Action/Plan:d/c plan home.   Expected Discharge Date:  11/02/16               Expected Discharge Plan:  Home/Self Care  In-House Referral:     Discharge planning Services  CM Consult  Post Acute Care Choice:    Choice offered to:     DME Arranged:    DME Agency:     HH Arranged:    HH Agency:     Status of Service:  Completed, signed off  If discussed at [employer] of Stay Meetings, dates discussed:    Additional Comments:  Dessa Phi, RN 11/02/2016, 11:32 AM

## 2016-11-02 NOTE — Discharge Summary (Signed)
Physician Discharge Summary  Hannah Khan DJM:[id number] DOB: [birth year] DOA: 10/31/2016  PCP: Lujean Amel, MD  Admit date: 10/31/2016 Discharge date: 11/02/2016  Time spent: 35 minutes  Recommendations for Outpatient Follow-up:  1. Patient complete 4 more days of by mouth antibiotics for community-acquired pneumonia 2. Resume diuretics please contact Chem-7 in about one week 3. Her A1c was 5 0.8 on this admission and I did not she requires chronic meds as is now diet controlled 4. Has no PT needs on follow-up and does not appear to need chronic oxygen currently  Discharge Diagnoses:  Principal Problem:   Community acquired pneumonia of right lower lobe of lung ([place]) Active Problems:   Obstructive sleep apnea   Essential hypertension   Cirrhosis of liver, with portal HTN & Acites, by CT Abdomen 07/20/13   Pancytopenia ([place])   Acute on chronic diastolic CHF (congestive heart failure), NYHA class 2 ([place])   Permanent atrial fibrillation ([place])   Type 2 diabetes mellitus without complication, without long-term current use of insulin ([place])   Acute hypoxemic respiratory failure ([place])   Discharge Condition: Improved  Diet recommendation: Heart healthy low-salt  Filed Weights   10/31/16 2158 11/01/16 0650 11/02/16 0506  Weight: (!) 167 kg (368 lb 1.6 oz) (!) 165.7 kg (365 lb 4.8 oz) (!) 167.2 kg (368 lb 9.8 oz)      Discharge Exam: 61 y.o. ? cryptogenic/NASH cirrhosis c/b severe chronic thrombocytopenia,  NIDDM diagnosed 01/2016?? OSA not on CPAP?? fibromylagia with MSK pain RUQ HTN,  Adenoma s/p colectomy HFpEF Afib CHad2Vasc2 score=4             Not candidate for watchman              Not candidate for DCCV not on warfarin who presented with productive cough and fevers.   several weeks of URI symptoms significantly worsening over the past few days with HA, myalgias, fever to 101F, worsening now productive cough, and wheezing. prescribed Hycodan and Xopenex for  bronchitis.   Her husband around that time was prescribed Tamiflu for influenza.  Due to no improvement she presented to her PCP and was sent to the ED for concern of pneumonia.  On arrival she was hypoxic, dipping into 80%'s at rest, tachycardic and tachypneic. WBC 2.4K, Hgb 10 (baseline around 11), platelets 20K (baseline <50K). TBili 2.5 (near baseline). BNP 232.  CXR showed vascular congestion and RLL opacity.  She was given ceftriaxone and azithromycin and admitted for acute hypoxia due to pneumonia.   She was subsequently transitioned to by mouth antibiotic and her respiratory status improved Continue to use  CPAP machine in the hospital obstructive sleep apnea A. fib with rate controlled with [place] score >3 and she is not on anticoagulation because of thrombocytopenia-being continued on Inderal and Cardizem by mouth on discharge She had mild decompensated acute diastolic heart failure admission and was -5 liters on discharge She is not on oral diabetic meds A1c was 7.5 last admit but was 5.8 this admission so no further need of coverage at this time and she should discuss this with her primary physician   Doing fair, ambulatory to the restroom without significant difficulty Eating drinking okay Feels overall much improved    General: Alert pleasant oriented no apparent distress Cardiovascular: S1-S2 no murmur rub or gallop Respiratory: Clinically clear no added sound Abdomen is grossly distended with overlying pannus and some ascites and fluid in abdomen  Discharge Instructions    Current Discharge Medication List  CONTINUE these medications which have NOT CHANGED   Details  benzonatate (TESSALON) 100 MG capsule Take 100 mg by mouth 3 (three) times daily.    bumetanide (BUMEX) 2 MG tablet Take 2 tablets by mouth twice daily. Ok to take 2 tablets 3 times a day as needed for weight gain Qty: 420 tablet, Refills: 3    Coenzyme Q10 (CO Q 10) 100 MG CAPS Take 100 mg by mouth  daily.    cyanocobalamin 1000 MCG tablet Take 1,000 mcg by mouth daily.   Associated Diagnoses: Thrombocytopenia (HCC)    diltiazem (CARDIZEM CD) 240 MG 24 hr capsule TAKE 1 CAPSULE (240 MG TOTAL) BY MOUTH DAILY. Qty: 30 capsule, Refills: 5    Ferrous Sulfate (IRON) 325 (65 FE) MG TABS Take 325 mg by mouth daily.    Associated Diagnoses: Thrombocytopenia (HCC)    Hydrocodone-Homatropine 5-1.5 MG TABS Take 1 tablet by mouth every 6 (six) hours.    levalbuterol (XOPENEX) 1.25 MG/3ML nebulizer solution Take 1 ampule by nebulization every 6 (six) hours.    methocarbamol (ROBAXIN) 500 MG tablet Take 250-750 mg by mouth daily as needed for muscle spasms.     Potassium Chloride ER 20 MEQ TBCR TAKE 2 TABLETS BY MOUTH TWICE A DAY Qty: 360 tablet, Refills: 3    propranolol (INDERAL) 10 MG tablet Take 1 tablet (10 mg total) by mouth 2 (two) times daily. Qty: 180 tablet, Refills: 3    spironolactone (ALDACTONE) 25 MG tablet Take 25 mg by mouth daily.        Allergies  Allergen Reactions  . Celecoxib Other (See Comments)    "speech slurred" with celebrex  . Prednisone Other (See Comments)    Rapid heart rate  . Shellfish Allergy Swelling    Lips tingle and swell. Whelps the size of quarters.   . Statins Other (See Comments)    aggravated fibromyalgia  . Ibuprofen     Due to platelets  . Tylenol [Acetaminophen]     Due to platelets      The results of significant diagnostics from this hospitalization (including imaging, microbiology, ancillary and laboratory) are listed below for reference.    Significant Diagnostic Studies: Dg Chest 2 View  Result Date: 10/31/2016 CLINICAL DATA:  61 year old presenting with 2 week history of cough, headache, body aches and wheezing. EXAM: CHEST  2 VIEW COMPARISON:  10/29/2016, 01/26/2016 and earlier. FINDINGS: AP semi-erect and lateral images were obtained. Cardiac silhouette moderately to markedly enlarged, unchanged. Hilar and mediastinal  contours otherwise unremarkable. Pulmonary venous hypertension without overt edema. New patchy airspace opacities in the right lower lobe. Lungs otherwise clear. No visible pleural effusions. Degenerative changes involving the thoracic spine. IMPRESSION: 1. Right lower lobe pneumonia. 2. Stable moderate to marked cardiomegaly. Pulmonary venous hypertension without overt edema. Electronically Signed   By: Evangeline Dakin M.D.   On: 10/31/2016 17:52    Microbiology: Recent Results (from the past 240 hour(s))  Culture, sputum-assessment     Status: None   Collection Time: 10/31/16  4:43 AM  Result Value Ref Range Status   Specimen Description SPUTUM  Final   Special Requests NONE  Final   Sputum evaluation   Final    Sputum specimen not acceptable for testing.  Please recollect.   Remigio Eisenmenger CERIC,RN [id number] @ 0525 BY J SCOTTON    Report Status 11/01/2016 FINAL  Final  Culture, sputum-assessment     Status: None   Collection Time: 11/02/16  7:19 AM  Result Value  Ref Range Status   Specimen Description SPUTUM  Final   Special Requests NONE  Final   Sputum evaluation THIS SPECIMEN IS ACCEPTABLE FOR SPUTUM CULTURE  Final   Report Status 11/02/2016 FINAL  Final     Labs: Basic Metabolic Panel:  Recent Labs Lab 10/31/16 1717 11/01/16 0515 11/02/16 0502  NA 136 136 140  K 3.5 3.7 3.3*  CL 102 104 103  CO2 26 24 28   GLUCOSE 175* 198* 130*  BUN 16 17 21*  CREATININE 0.87 0.80 0.97  CALCIUM 8.7* 8.6* 8.8*  MG  --   --  1.8   Liver Function Tests:  Recent Labs Lab 10/31/16 1717  AST 33  ALT 17  ALKPHOS 61  BILITOT 2.5*  PROT 7.1  ALBUMIN 3.4*   No results for input(s): LIPASE, AMYLASE in the last 168 hours. No results for input(s): AMMONIA in the last 168 hours. CBC:  Recent Labs Lab 10/31/16 1927 11/01/16 0515 11/02/16 0502  WBC 2.4* 1.5* 3.5*  NEUTROABS 2.1  --   --   HGB 10.0* 9.5* 10.6*  HCT 29.7* 28.3* 31.4*  MCV 89.5 87.9 90.5  PLT 20* 13* 22*    Cardiac Enzymes: No results for input(s): CKTOTAL, CKMB, CKMBINDEX, TROPONINI in the last 168 hours. BNP: BNP (last 3 results)  Recent Labs  01/26/16 1128 10/31/16 1717  BNP 223.1* 232.8*    ProBNP (last 3 results) No results for input(s): PROBNP in the last 8760 hours.  CBG:  Recent Labs Lab 11/01/16 0711 11/01/16 1144 11/01/16 1625 11/01/16 2158 11/02/16 0728  GLUCAP 180* 184* 134* 114* 115*       Signed:  Nita Sells MD   [facility] 11/02/2016, 10:53 AM

## 2016-11-04 ENCOUNTER — Telehealth: Payer: Self-pay | Admitting: Oncology

## 2016-11-04 ENCOUNTER — Other Ambulatory Visit: Payer: BC Managed Care – PPO

## 2016-11-04 ENCOUNTER — Ambulatory Visit: Payer: BC Managed Care – PPO | Admitting: Oncology

## 2016-11-04 NOTE — Telephone Encounter (Signed)
Pt called to r/s 2/16 appts due to not feeling well. Gave pt new appt date/time

## 2016-11-05 LAB — CULTURE, RESPIRATORY W GRAM STAIN

## 2016-11-05 LAB — CULTURE, RESPIRATORY: CULTURE: NORMAL

## 2016-11-08 ENCOUNTER — Other Ambulatory Visit: Payer: Self-pay | Admitting: Gastroenterology

## 2016-11-08 DIAGNOSIS — K7460 Unspecified cirrhosis of liver: Secondary | ICD-10-CM

## 2016-11-14 ENCOUNTER — Ambulatory Visit (INDEPENDENT_AMBULATORY_CARE_PROVIDER_SITE_OTHER): Payer: BC Managed Care – PPO | Admitting: Cardiovascular Disease

## 2016-11-14 ENCOUNTER — Encounter: Payer: Self-pay | Admitting: Cardiovascular Disease

## 2016-11-14 VITALS — BP 118/68 | HR 87 | Ht 67.0 in | Wt 339.0 lb

## 2016-11-14 DIAGNOSIS — I10 Essential (primary) hypertension: Secondary | ICD-10-CM | POA: Diagnosis not present

## 2016-11-14 DIAGNOSIS — I481 Persistent atrial fibrillation: Secondary | ICD-10-CM | POA: Diagnosis not present

## 2016-11-14 DIAGNOSIS — I4819 Other persistent atrial fibrillation: Secondary | ICD-10-CM

## 2016-11-14 DIAGNOSIS — I5032 Chronic diastolic (congestive) heart failure: Secondary | ICD-10-CM

## 2016-11-14 NOTE — Patient Instructions (Addendum)
Medication Instructions:  TAKE BUMEX (BUMETANIDE) 6 MG DAILY AS NEEDED FOR WEIGHT GAIN  Labwork: NONE  Testing/Procedures: NONE  Follow-Up: Your physician recommends that you schedule a follow-up appointment in: 3 MONTH OV   If you need a refill on your cardiac medications before your next appointment, please call your pharmacy.

## 2016-11-14 NOTE — Progress Notes (Signed)
Cardiology Office Note   Date:  11/14/2016   ID:  Hannah Khan, DOB May 26, 1956, MRN [id number]  PCP:  Hannah Amel, MD  Cardiologist:   Hannah Latch, MD  Dr. Paulita Khan: GI Dr. Alen Khan: Hematology  Chief Complaint  Patient presents with  . Follow-up    Pt states no Sx.     Patient ID:  Hannah Khan is a 61 y.o. female with hypertension, atrial fibrillation, diabetes mellitus type 2, hyperlipidemia, cryptogenic cirrhosis, thrombocytopenia, and morbid obesity who presents for follow-up.  Hannah Khan was admitted to the hospital 3/2017with atrial fibrillation with rapid ventricular response. Given her history of portal hypertension and cirrhosis she was not anticoagulated. She was started on diltiazem for rate control and continued on her home propranolol. Her rate did not stay consistently less than 100 bpm so she was started on digoxin as well.  Echo revealed normal systolic function and her thyroid function was normal.  She was admitted to the hospital 11/6642 with diastolic heart failure.  She was diuresed with IV lasix and digoxin was discontinued due to hypokalemia.  She was seen in follow up 02/2016, at which time she complained of hypotension.  Her heart rate was well-controlled but she continued to feel poorly in AF.  She was referred to Dr. Rayann Heman for consideration of a Watchman device however, he felt that she would not be a good candidate, as she will only be able to be on antiplatelets for one month and she is unlikely to remain in sinus rhythm without significant weight loss.   Since her last appointment Ms. Collard had the flu and pneumonia.  She just started feeling better last week.  She continues to be somewhat short of breath with exertion. Her blood pressure and heart rate have been much better controlled. Her edema has been much better. She notes that sometimes when she takes Bumex she doesn't get a good urinary response.  Taking an additional dose hasn't helped.   Overall her weight has been stable since leaving the hospital.  She denies orthopnea or PND.   Past Medical History:  Diagnosis Date  . Atrial fibrillation ([place])   . Blood dyscrasia   . Diabetes mellitus without complication ([place])   . Dysrhythmia   . Fibromyalgia   . HTN (hypertension)   . Hx of colonic polyps    s/p partial colectomy  . Hyperlipidemia   . ITP (idiopathic thrombocytopenic purpura)   . Left leg swelling    wear compression hose  . Liver cirrhosis secondary to NASH (nonalcoholic steatohepatitis) ([place]) dx nov 2014   had enlarged spleen also   . Low blood pressure   . Morbid obesity ([place])   . PONV (postoperative nausea and vomiting)    in past none recent  . Portal hypertension ([place])   . Shortness of breath dyspnea    history of with fluid overload  . Sleep apnea    uses cpap setting of 14  . Sore on scalp    may bleed secondary to ITP  . Spleen enlarged     Past Surgical History:  Procedure Laterality Date  . CESAREAN SECTION  1989  . CHOLECYSTECTOMY  20 yrs ago  . COLECTOMY  2011  . DILATATION & CURETTAGE/HYSTEROSCOPY WITH MYOSURE N/A 05/06/2016   Procedure: DILATATION & CURETTAGE/HYSTEROSCOPY WITH MYOSURE WITH POLYPECTOMY;  Surgeon: Hannah Pupa, DO;  Location: [facility];  Service: Gynecology;  Laterality: N/A;  . ESOPHAGOGASTRODUODENOSCOPY (EGD) WITH PROPOFOL N/A 09/04/2013   Procedure: ESOPHAGOGASTRODUODENOSCOPY (  EGD) WITH PROPOFOL;  Surgeon: Hannah Silence, MD;  Location: [facility];  Service: Endoscopy;  Laterality: N/A;  . KNEE ARTHROSCOPY  yrs ago   bilateral, one done x 1, one done twice     Current Outpatient Prescriptions  Medication Sig Dispense Refill  . bumetanide (BUMEX) 2 MG tablet Take 2 mg by mouth as directed. 3 TABLETS (6 MG) BY MOUTH DAILY AS NEEDED FOR WEIGHT GAIN    . benzonatate (TESSALON) 100 MG capsule Take 1 capsule (100 mg total) by mouth 3 (three) times daily. 20 capsule 0  . cefUROXime (CEFTIN) 500 MG tablet Take 1 tablet  (500 mg total) by mouth 2 (two) times daily with a meal. 8 tablet 0  . Coenzyme Q10 (CO Q 10) 100 MG CAPS Take 100 mg by mouth daily.    Marland Kitchen diltiazem (CARDIZEM CD) 240 MG 24 hr capsule TAKE 1 CAPSULE (240 MG TOTAL) BY MOUTH DAILY. 30 capsule 5  . Hydrocodone-Homatropine 5-1.5 MG TABS Take 1 tablet by mouth every 8 (eight) hours. 40 each 0  . levalbuterol (XOPENEX) 1.25 MG/3ML nebulizer solution Take 1 ampule by nebulization every 6 (six) hours.    . methocarbamol (ROBAXIN) 500 MG tablet Take 250-750 mg by mouth daily as needed for muscle spasms.     . Potassium Chloride ER 20 MEQ TBCR TAKE 2 TABLETS BY MOUTH TWICE A DAY (Patient taking differently: Take 40 mEq by mouth 2 (two) times daily. ) 360 tablet 3  . propranolol (INDERAL) 10 MG tablet Take 1 tablet (10 mg total) by mouth 2 (two) times daily. 180 tablet 3  . spironolactone (ALDACTONE) 25 MG tablet Take 25 mg by mouth daily.      No current facility-administered medications for this visit.     Allergies:   Celecoxib; Prednisone; Shellfish allergy; Statins; Ibuprofen; and Tylenol [acetaminophen]    Social History:  The patient  reports that she quit smoking about 42 years ago. Her smoking use included Cigarettes. She has a 3.00 pack-year smoking history. She has never used smokeless tobacco. She reports that she drinks about 0.6 oz of alcohol per week . She reports that she does not use drugs.   Family History:  [name] family history includes Cancer in her brother, father, and mother; Heart failure in her mother; Hyperlipidemia in her brother and mother; Lung cancer in her father; Stroke in her other.    ROS:  Please see the history of present illness.   Otherwise, review of systems are positive for none.   All other systems are reviewed and negative.    PHYSICAL EXAM: VS:  BP 118/68   Pulse 87   Ht 5' 7"  (1.702 m)   Wt (!) 153.8 kg (339 lb)   BMI 53.09 kg/m  , BMI Body mass index is 53.09 kg/m. GENERAL:  Well appearing.  Morbidly obese HEENT:  Pupils equal round and reactive, fundi not visualized, oral mucosa unremarkable NECK:  No jugular venous distention, waveform within normal limits, carotid upstroke brisk and symmetric, no bruits LYMPHATICS:  No cervical adenopathy LUNGS:  Clear to auscultation bilaterally HEART:  Irregularly irregular.  Distant heart sounds.  PMI not displaced or sustained,S1 and S2 within normal limits, no S3, no S4, no clicks, no rubs, II/VI systolic murmur at the RUSB ABD:  Flat, positive bowel sounds normal in frequency in pitch, no bruits, no rebound, no guarding, no midline pulsatile mass, no hepatomegaly, no splenomegaly EXT:  2 plus pulses throughout, 1+ pitting edema to the  lower tibia bilaterally, no cyanosis no clubbing SKIN:  No rashes no nodules NEURO:  Cranial nerves II through XII grossly intact, motor grossly intact throughout PSYCH:  Cognitively intact, oriented to person place and time   EKG:  EKG is ordered today.  06/06/16: Atrial fibrillation. Rate 93 bpm. Low voltage. 11/14/16: Atrial fibrillation. Rate 87 bpm.  Echo 12/10/15: Study Conclusions  - Procedure narrative: Transthoracic echocardiography. The study  was technically difficult, as a result of body habitus.  Intravenous contrast (Definity) was administered. - Left ventricle: The cavity size was normal. Wall thickness was  increased in a pattern of mild LVH. Systolic function was normal.  The estimated ejection fraction was in the range of 55% to 60%.  Wall motion was normal; there were no regional wall motion  abnormalities. The study is not technically sufficient to allow  evaluation of LV diastolic function. - Mitral valve: Mildly thickened leaflets . There was trivial  regurgitation. - Left atrium: The atrium was mildly dilated. - Inferior vena cava: The vessel was dilated. The respirophasic  diameter changes were blunted (< 50%), consistent with elevated  central venous pressure. -  Pericardium, extracardiac: A trivial pericardial effusion was  identified posterior to the heart.  Impressions:  - Technically difficult study. The patient was in atrial flutter  during the study. The LVEF is 55-60%, mild LVH, grossly normal  wall motion, trivial MR, mild LAE, dilated IVC, trivial posterior  pericardial effusion.  Recent Labs: 01/27/2016: TSH 2.932 10/31/2016: ALT 17; B Natriuretic Peptide 232.8 11/02/2016: BUN 21; Creatinine, Ser 0.97; Hemoglobin 10.6; Magnesium 1.8; Platelets 22; Potassium 3.3; Sodium 140    Lipid Panel    Component Value Date/Time   CHOL 175 07/02/2014 1138   TRIG 93 07/02/2014 1138   HDL 59 07/02/2014 1138   CHOLHDL 3.0 07/02/2014 1138   VLDL 19 07/02/2014 1138   LDLCALC 97 07/02/2014 1138      Wt Readings from Last 3 Encounters:  11/14/16 (!) 153.8 kg (339 lb)  11/02/16 (!) 167.2 kg (368 lb 9.8 oz)  07/10/16 (!) 158.8 kg (350 lb 1.5 oz)     ASSESSMENT AND PLAN:  # Persistent atrial fibrillation: Ms. Burling is doing well and her heart rates are well-controlled.  She Is not a candidate for long-term anticoagulation due to cirrhosis.  This disqualifies her for the Watchman device and therefore we will not continue with a rhythm control strategy.  Continue diltiazem and propranolol. This patients CHA2DS2-VASc Score and unadjusted Ischemic Stroke Rate (% per year) is equal to 3.2 % stroke rate/year from a score of 3 Above score calculated as 1 point each if present [CHF, HTN, DM, Vascular=MI/PAD/Aortic Plaque, Age if 65-74, or Female] Above score calculated as 2 points each if present [Age > 75, or Stroke/TIA/TE]  # Hypertension:  Blood pressure remains low but she is asympotmatic.  Continue propranolol and diltiazem.   # Chronic diastolic heart failure: Edema has improved.  She will continue Bumex 47m bid.  Can increase to 677mif her weight is up by 2lb in 1 day or 5lb in a week.  Continue spironolactone, propranolol and diltiazem.     Current medicines are reviewed at length with the patient today.  The patient does not have concerns regarding medicines.   The following changes have been made:  Increase bumex as above.   Labs/ tests ordered today include:   No orders of the defined types were placed in this encounter.     Disposition:  FU with Latif Nazareno C. Oval Linsey, MD, [facility] in 3 months.   This note was written with the assistance of speech recognition software.  Please excuse any transcriptional errors.  Signed, Eaven Schwager C. Oval Linsey, MD, [facility]  11/14/2016 5:46 PM    [facility]

## 2016-11-21 ENCOUNTER — Ambulatory Visit
Admission: RE | Admit: 2016-11-21 | Discharge: 2016-11-21 | Disposition: A | Discharge status: Home / Self Care | Payer: BC Managed Care – PPO | Source: Ambulatory Visit | Attending: Gastroenterology | Admitting: Gastroenterology

## 2016-11-21 DIAGNOSIS — K7460 Unspecified cirrhosis of liver: Secondary | ICD-10-CM

## 2016-12-02 ENCOUNTER — Telehealth: Payer: Self-pay | Admitting: Oncology

## 2016-12-02 ENCOUNTER — Other Ambulatory Visit (HOSPITAL_BASED_OUTPATIENT_CLINIC_OR_DEPARTMENT_OTHER): Payer: BC Managed Care – PPO

## 2016-12-02 ENCOUNTER — Ambulatory Visit (HOSPITAL_BASED_OUTPATIENT_CLINIC_OR_DEPARTMENT_OTHER): Payer: BC Managed Care – PPO | Admitting: Oncology

## 2016-12-02 VITALS — BP 140/62 | HR 95 | Temp 97.1°F | Resp 18 | Wt 341.1 lb

## 2016-12-02 DIAGNOSIS — D6959 Other secondary thrombocytopenia: Secondary | ICD-10-CM | POA: Diagnosis not present

## 2016-12-02 DIAGNOSIS — I4891 Unspecified atrial fibrillation: Secondary | ICD-10-CM

## 2016-12-02 DIAGNOSIS — D649 Anemia, unspecified: Secondary | ICD-10-CM | POA: Diagnosis not present

## 2016-12-02 DIAGNOSIS — K7460 Unspecified cirrhosis of liver: Secondary | ICD-10-CM | POA: Diagnosis not present

## 2016-12-02 DIAGNOSIS — D6949 Other primary thrombocytopenia: Secondary | ICD-10-CM

## 2016-12-02 DIAGNOSIS — D696 Thrombocytopenia, unspecified: Secondary | ICD-10-CM

## 2016-12-02 DIAGNOSIS — D508 Other iron deficiency anemias: Secondary | ICD-10-CM

## 2016-12-02 LAB — CBC WITH DIFFERENTIAL/PLATELET
BASO%: 1 % (ref 0.0–2.0)
Basophils Absolute: 0.1 10*3/uL (ref 0.0–0.1)
EOS%: 1.8 % (ref 0.0–7.0)
Eosinophils Absolute: 0.1 10*3/uL (ref 0.0–0.5)
HEMATOCRIT: 35.1 % (ref 34.8–46.6)
HGB: 12.5 g/dL (ref 11.6–15.9)
LYMPH%: 29.2 % (ref 14.0–49.7)
MCH: 31 pg (ref 25.1–34.0)
MCHC: 35.6 g/dL (ref 31.5–36.0)
MCV: 87.1 fL (ref 79.5–101.0)
MONO#: 0.5 10*3/uL (ref 0.1–0.9)
MONO%: 9.4 % (ref 0.0–14.0)
NEUT%: 58.6 % (ref 38.4–76.8)
NEUTROS ABS: 3 10*3/uL (ref 1.5–6.5)
PLATELETS: 42 10*3/uL — AB (ref 145–400)
RBC: 4.03 10*6/uL (ref 3.70–5.45)
RDW: 15.2 % — ABNORMAL HIGH (ref 11.2–14.5)
WBC: 5.1 10*3/uL (ref 3.9–10.3)
lymph#: 1.5 10*3/uL (ref 0.9–3.3)

## 2016-12-02 NOTE — Telephone Encounter (Signed)
Appointments scheduled per 3.16.18 LOS. Patient given AVS report and calendars with future scheduled appointments.

## 2016-12-02 NOTE — Progress Notes (Signed)
Hematology and Oncology Follow Up Visit  Hannah Khan [id number] [birth year] 61 y.o. 12/02/2016 4:01 PM  CC: Hannah Silence, MD Hannah Khan, M.D. Hannah Khan. Hannah Pulling, MD Hannah Khan, M.D.    Principle Diagnosis: This is a 61 year old female with the following issues:   1. Thrombocytopenia related to cirrhosis of the liver as well as platelet clumping. She is currently on observation and surveillance. 2.     Status post a partial colectomy due to a tubular adenoma, without any evidence of high-grade dysplasia or malignancy.   Current therapy: Observation, surveillance  Interim History: Hannah Khan presents today for a follow-up visit. Since her last visit, she was hospitalized in February 2018 after presenting with respiratory distress and found to have community-acquired pneumonia. She was treated with intravenous antibiotics and have recovered reasonably well since her discharge on 11/30/2016. She currently feels reasonably well and the breathing normally. She has not reported any hemoptysis, hematemesis, hematochezia or melena.   She continues to have atrial fibrillation and not chronically anticoagulated because of thrombocytopenia. She is currently rate controlled and relatively asymptomatic. She had reported symptoms of fatigue which has been manageable. She denied any recent cardiac complications or chest pain.  She does not report any headaches, blurry vision, syncope or seizures. She has not reported any decline in her energy her performance status. She does not report any fevers or chills or sweats.She has not reported any chest pain or shortness of breath. She does not report any nausea, vomiting or abdominal pain. Does not report any cough or hemoptysis.rest of her review of systems unremarkable.  Medications: I have reviewed the patient's current medications.  Current Outpatient Prescriptions  Medication Sig Dispense Refill  . bumetanide (BUMEX) 2 MG tablet Take 2 mg  by mouth as directed. 3 TABLETS (6 MG) BY MOUTH DAILY AS NEEDED FOR WEIGHT GAIN    . Coenzyme Q10 (CO Q 10) 100 MG CAPS Take 100 mg by mouth daily.    Marland Kitchen diltiazem (CARDIZEM CD) 240 MG 24 hr capsule TAKE 1 CAPSULE (240 MG TOTAL) BY MOUTH DAILY. 30 capsule 5  . levalbuterol (XOPENEX) 1.25 MG/3ML nebulizer solution Take 1 ampule by nebulization every 6 (six) hours.    . methocarbamol (ROBAXIN) 500 MG tablet Take 250-750 mg by mouth daily as needed for muscle spasms.     . Potassium Chloride ER 20 MEQ TBCR TAKE 2 TABLETS BY MOUTH TWICE A DAY (Patient taking differently: Take 40 mEq by mouth 2 (two) times daily. ) 360 tablet 3  . propranolol (INDERAL) 10 MG tablet Take 1 tablet (10 mg total) by mouth 2 (two) times daily. 180 tablet 3  . spironolactone (ALDACTONE) 25 MG tablet Take 25 mg by mouth daily.      No current facility-administered medications for this visit.     Allergies:  Allergies  Allergen Reactions  . Celecoxib Other (See Comments)    "speech slurred" with celebrex  . Prednisone Other (See Comments)    Rapid heart rate  . Shellfish Allergy Swelling    Lips tingle and swell. Whelps the size of quarters.   . Statins Other (See Comments)    aggravated fibromyalgia  . Ibuprofen     Due to platelets  . Tylenol [Acetaminophen]     Due to platelets    Past Medical History, Surgical history, Social history, and Family History were reviewed and updated.   Physical Exam: Blood pressure 140/62, pulse 95, temperature 97.1 F (36.2 C), temperature source Oral, resp.  rate 18, weight (!) 341 lb 1 oz (154.7 kg), SpO2 96 %. ECOG: 1 General appearance: A well-appearing woman appeared without distress. Head: Normocephalic, without obvious abnormality no oral bleeding or ulcers. Neck: no adenopathy Lymph nodes: Cervical, supraclavicular, and axillary nodes normal. Heart: Tachycardic without murmurs. Lung:chest clear, no wheezing, rales, normal symmetric air entry Abdomen: soft,  non-tender, without masses or organomegaly no shifting dullness or ascites. EXT:no erythema, induration, or nodules. In: No rashes, lesions or petechiae.   Lab Results: Lab Results  Component Value Date   WBC 5.1 12/02/2016   HGB 12.5 12/02/2016   HCT 35.1 12/02/2016   MCV 87.1 12/02/2016   PLT 42 (L) 12/02/2016     Chemistry      Component Value Date/Time   NA 140 11/02/2016 0502   NA 142 08/01/2014 1509   K 3.3 (L) 11/02/2016 0502   K 4.1 08/01/2014 1509   CL 103 11/02/2016 0502   CL 107 08/03/2012 1449   CO2 28 11/02/2016 0502   CO2 23 08/01/2014 1509   BUN 21 (H) 11/02/2016 0502   BUN 16.9 08/01/2014 1509   CREATININE 0.97 11/02/2016 0502   CREATININE 0.77 06/28/2016 1157   CREATININE 0.7 08/01/2014 1509      Component Value Date/Time   CALCIUM 8.8 (L) 11/02/2016 0502   CALCIUM 9.3 08/01/2014 1509   ALKPHOS 61 10/31/2016 1717   ALKPHOS 82 08/01/2014 1509   AST 33 10/31/2016 1717   AST 54 (H) 08/01/2014 1509   ALT 17 10/31/2016 1717   ALT 30 08/01/2014 1509   BILITOT 2.5 (H) 10/31/2016 1717   BILITOT 1.71 (H) 08/01/2014 7736       61 year old with the following issues:     1. Thrombocytopenia due to cirrhosis of the liver. Her platelet count Have improved recently with counts close to 40,000. She has no active bleeding and no treatment is required at this time. Platelet transfusion or platelet growth factor can be used if needed to to boost her platelet count. 2. Atrial fibrillation: She continues to follow with cardiology regarding this issue. She is not a candidate for full dose anticoagulation given her bleeding risk from liver disease and thrombocytopenia. 3. Cirrhosis of the liver: Follow up with gastroenterology regarding this issue. 4. Mild anemia: Resolved at this time is no longer taking any iron supplements. Iron studies are repeated today. 5. Follow up: in 6 months scan sooner if needed to.    [facility] 3/16/20184:01 PM

## 2016-12-05 LAB — IRON AND TIBC
%SAT: 31 % (ref 21–57)
Iron: 85 ug/dL (ref 41–142)
TIBC: 278 ug/dL (ref 236–444)
UIBC: 192 ug/dL (ref 120–384)

## 2016-12-05 LAB — FERRITIN: FERRITIN: 140 ng/mL (ref 9–269)

## 2016-12-17 ENCOUNTER — Other Ambulatory Visit: Payer: Self-pay | Admitting: Cardiovascular Disease

## 2016-12-19 NOTE — Telephone Encounter (Signed)
Refill Request.  

## 2017-01-26 ENCOUNTER — Telehealth: Payer: Self-pay | Admitting: Cardiovascular Disease

## 2017-02-02 NOTE — Telephone Encounter (Signed)
Close encounter 

## 2017-02-06 ENCOUNTER — Ambulatory Visit (INDEPENDENT_AMBULATORY_CARE_PROVIDER_SITE_OTHER): Payer: BC Managed Care – PPO | Admitting: Cardiovascular Disease

## 2017-02-06 ENCOUNTER — Encounter: Payer: Self-pay | Admitting: Cardiovascular Disease

## 2017-02-06 VITALS — BP 105/58 | HR 95 | Ht 67.0 in | Wt 344.0 lb

## 2017-02-06 DIAGNOSIS — I10 Essential (primary) hypertension: Secondary | ICD-10-CM

## 2017-02-06 DIAGNOSIS — I482 Chronic atrial fibrillation, unspecified: Secondary | ICD-10-CM

## 2017-02-06 DIAGNOSIS — I5032 Chronic diastolic (congestive) heart failure: Secondary | ICD-10-CM

## 2017-02-06 DIAGNOSIS — I481 Persistent atrial fibrillation: Secondary | ICD-10-CM | POA: Diagnosis not present

## 2017-02-06 DIAGNOSIS — I4819 Other persistent atrial fibrillation: Secondary | ICD-10-CM

## 2017-02-06 MED ORDER — PROPRANOLOL HCL 10 MG PO TABS: 10.0000 mg | ORAL_TABLET | Freq: Two times a day (BID) | ORAL | 3 refills | Status: DC

## 2017-02-06 MED ORDER — DILTIAZEM HCL ER COATED BEADS 240 MG PO CP24: 240.0000 mg | ORAL_CAPSULE | Freq: Every day | ORAL | 3 refills | Status: DC

## 2017-02-06 NOTE — Progress Notes (Signed)
Cardiology Office Note   Date:  02/06/2017   ID:  Hannah Khan, DOB 02/26/1956, MRN [id number]  PCP:  Lujean Amel, MD  Cardiologist:   Skeet Latch, MD  Dr. Paulita Fujita: GI Dr. Alen Blew: Hematology  Chief Complaint  Patient presents with  . Follow-up    3 months;    Patient ID:  Hannah Khan is a 61 y.o. female with hypertension, atrial fibrillation, diabetes mellitus type 2, hyperlipidemia, cryptogenic cirrhosis, thrombocytopenia, and morbid obesity who presents for follow-up.  Hannah Khan was admitted to the hospital 11/2015 with atrial fibrillation with rapid ventricular response. Given her history of portal hypertension and cirrhosis she was not anticoagulated. She was started on diltiazem for rate control and continued on her home propranolol. Her rate did not stay consistently less than 100 bpm so she was started on digoxin as well.  Echo revealed normal systolic function and her thyroid function was normal.  She was admitted to the hospital 04/1016 with diastolic heart failure.  She was diuresed with IV lasix and digoxin was discontinued due to hypokalemia.  She was seen in follow up 02/2016, at which time she complained of hypotension.  Her heart rate was well-controlled but she continued to feel poorly in AF.  She was referred to Dr. Rayann Heman for consideration of a Watchman device however, he felt that she would not be a good candidate, as she will only be able to be on antiplatelets for one month and she is unlikely to remain in sinus rhythm without significant weight loss.   Hannah Khan has been doing well.  She occasionally has palpitations.  She also has lightheadedness with standing, when bending over and after going to the bathroom.  This happens 2-3 times per week.  She denies syncope.  She brings a log of her blood pressure that showed it is mostly been in the 51W systolic. Her edema has been better-controlled. Several weeks ago she had some blistering after not wearing her  compression stockings for his a couple days. She started back wearing the compression stockings and took extra Bumex.  She noted improvement in her edema. She denies orthopnea or PND.  She also denies chest pain or pressure.  Hannah Khan is planning a six-week car ride across country this summer with her husband.  Past Medical History:  Diagnosis Date  . Atrial fibrillation ([place])   . Blood dyscrasia   . Diabetes mellitus without complication ([employer])   . Dysrhythmia   . Fibromyalgia   . HTN (hypertension)   . Hx of colonic polyps    s/p partial colectomy  . Hyperlipidemia   . ITP (idiopathic thrombocytopenic purpura)   . Left leg swelling    wear compression hose  . Liver cirrhosis secondary to NASH (nonalcoholic steatohepatitis) ([place]) dx nov 2014   had enlarged spleen also   . Low blood pressure   . Morbid obesity ([place])   . PONV (postoperative nausea and vomiting)    in past none recent  . Portal hypertension ([place])   . Shortness of breath dyspnea    history of with fluid overload  . Sleep apnea    uses cpap setting of 14  . Sore on scalp    may bleed secondary to ITP  . Spleen enlarged     Past Surgical History:  Procedure Laterality Date  . CESAREAN SECTION  1989  . CHOLECYSTECTOMY  20 yrs ago  . COLECTOMY  2011  . DILATATION & CURETTAGE/HYSTEROSCOPY WITH MYOSURE  N/A 05/06/2016   Procedure: DILATATION & CURETTAGE/HYSTEROSCOPY WITH MYOSURE WITH POLYPECTOMY;  Surgeon: Janyth Pupa, DO;  Location: [facility];  Service: Gynecology;  Laterality: N/A;  . ESOPHAGOGASTRODUODENOSCOPY (EGD) WITH PROPOFOL N/A 09/04/2013   Procedure: ESOPHAGOGASTRODUODENOSCOPY (EGD) WITH PROPOFOL;  Surgeon: [name], MD;  Location: [facility];  Service: Endoscopy;  Laterality: N/A;  . KNEE ARTHROSCOPY  yrs ago   bilateral, one done x 1, one done twice     Current Outpatient Prescriptions  Medication Sig Dispense Refill  . bumetanide (BUMEX) 2 MG tablet Take 2 mg by mouth as directed. 3  TABLETS (6 MG) BY MOUTH DAILY AS NEEDED FOR WEIGHT GAIN    . Coenzyme Q10 (CO Q 10) 100 MG CAPS Take 100 mg by mouth daily.    Marland Kitchen diltiazem (CARDIZEM CD) 240 MG 24 hr capsule Take 1 capsule (240 mg total) by mouth daily. 90 capsule 3  . JARDIANCE 25 MG TABS tablet Take 25 mg by mouth daily.  5  . levalbuterol (XOPENEX) 1.25 MG/3ML nebulizer solution Take 1 ampule by nebulization every 6 (six) hours.    . methocarbamol (ROBAXIN) 500 MG tablet Take 250-750 mg by mouth daily as needed for muscle spasms.     . Potassium Chloride ER 20 MEQ TBCR TAKE 2 TABLETS BY MOUTH TWICE A DAY (Patient taking differently: Take 40 mEq by mouth 2 (two) times daily. ) 360 tablet 3  . propranolol (INDERAL) 10 MG tablet Take 1 tablet (10 mg total) by mouth 2 (two) times daily. 180 tablet 3  . spironolactone (ALDACTONE) 25 MG tablet Take 25 mg by mouth daily.      No current facility-administered medications for this visit.     Allergies:   Celecoxib; Prednisone; Shellfish allergy; Statins; Ibuprofen; and Tylenol [acetaminophen]    Social History:  The patient  reports that she quit smoking about 42 years ago. Her smoking use included Cigarettes. She has a 3.00 pack-year smoking history. She has never used smokeless tobacco. She reports that she drinks about 0.6 oz of alcohol per week . She reports that she does not use drugs.   Family History:  [name] family history includes Cancer in her brother, father, and mother; Heart failure in her mother; Hyperlipidemia in her brother and mother; Lung cancer in her father; Stroke in her other.    ROS:  Please see the history of present illness.   Otherwise, review of systems are positive for none.   All other systems are reviewed and negative.    PHYSICAL EXAM: VS:  BP (!) 105/58   Pulse 95   Ht 5' 7"  (1.702 m)   Wt (!) 156 kg (344 lb)   BMI 53.88 kg/m  , BMI Body mass index is 53.88 kg/m. GENERAL:  Well appearing. Morbidly obese HEENT:  Pupils equal round and  reactive, fundi not visualized, oral mucosa unremarkable NECK:  No jugular venous distention, waveform within normal limits, carotid upstroke brisk and symmetric, no bruits LUNGS:  Clear to auscultation bilaterally.  No crackles, rhonchi, or wheezes. HEART:  Irregularly irregular.  Distant heart sounds.  PMI not displaced or sustained,S1 and S2 within normal limits, no S3, no S4, no clicks, no rubs, II/VI systolic murmur at the RUSB ABD:  Flat, positive bowel sounds normal in frequency in pitch, no bruits, no rebound, no guarding, no midline pulsatile mass, no hepatomegaly, no splenomegaly EXT:  2 plus pulses throughout, 1+ pitting edema in bilateral LEs, no cyanosis no clubbing SKIN:  No rashes  no nodules NEURO:  Cranial nerves II through XII grossly intact, motor grossly intact throughout PSYCH:  Cognitively intact, oriented to person place and time   EKG:  EKG is ordered today.  06/06/16: Atrial fibrillation. Rate 93 bpm. Low voltage. 11/14/16: Atrial fibrillation. Rate 87 bpm. 02/07/79 atrial fibrillation. Rate 95 bpm. Low voltage precordial leads. Incomplete right bundle branch block.  Echo 12/10/15: Study Conclusions  - Procedure narrative: Transthoracic echocardiography. The study  was technically difficult, as a result of body habitus.  Intravenous contrast (Definity) was administered. - Left ventricle: The cavity size was normal. Wall thickness was  increased in a pattern of mild LVH. Systolic function was normal.  The estimated ejection fraction was in the range of 55% to 60%.  Wall motion was normal; there were no regional wall motion  abnormalities. The study is not technically sufficient to allow  evaluation of LV diastolic function. - Mitral valve: Mildly thickened leaflets . There was trivial  regurgitation. - Left atrium: The atrium was mildly dilated. - Inferior vena cava: The vessel was dilated. The respirophasic  diameter changes were blunted (< 50%),  consistent with elevated  central venous pressure. - Pericardium, extracardiac: A trivial pericardial effusion was  identified posterior to the heart.  Impressions:  - Technically difficult study. The patient was in atrial flutter  during the study. The LVEF is 55-60%, mild LVH, grossly normal  wall motion, trivial MR, mild LAE, dilated IVC, trivial posterior  pericardial effusion.  Recent Labs: 10/31/2016: ALT 17; B Natriuretic Peptide 232.8 11/02/2016: BUN 21; Creatinine, Ser 0.97; Magnesium 1.8; Potassium 3.3; Sodium 140 12/02/2016: HGB 12.5; Platelets 42    Lipid Panel    Component Value Date/Time   CHOL 175 07/02/2014 1138   TRIG 93 07/02/2014 1138   HDL 59 07/02/2014 1138   CHOLHDL 3.0 07/02/2014 1138   VLDL 19 07/02/2014 1138   LDLCALC 97 07/02/2014 1138      Wt Readings from Last 3 Encounters:  02/06/17 (!) 156 kg (344 lb)  12/02/16 (!) 154.7 kg (341 lb 1 oz)  11/14/16 (!) 153.8 kg (339 lb)     ASSESSMENT AND PLAN:  # Persistent atrial fibrillation: Ms. Schabel is doing well and her heart rates are better controlled.  She Is not a candidate for long-term anticoagulation due to cirrhosis.  This disqualifies her for the Watchman device and therefore we will continue with rate control.  Continue diltiazem and propranolol. This patients CHA2DS2-VASc Score and unadjusted Ischemic Stroke Rate (% per year) is equal to 3.2 % stroke rate/year from a score of 3 Above score calculated as 1 point each if present [CHF, HTN, DM, Vascular=MI/PAD/Aortic Plaque, Age if 65-74, or Female] Above score calculated as 2 points each if present [Age > 75, or Stroke/TIA/TE]  # Hypertension:  Blood pressure remains low.  She does have some lightheadedness and dizziness. However her heart rates are in the 90s at rest. I suspect that if we lower her nodal agents her art rate will not be well-controlled and she will have shortness of breath again. We will continue propranolol and diltiazem.  She will continue to take her time when changing positions. She is wondering if she can stop spironolactone both to help her blood pressure and help with her blood sugars. She will discuss this with Dr. Paulita Fujita, as she is taking this medication to prevent ascites.    # Chronic diastolic heart failure: Edema is stable.  Continue Bumex 39m bid.  She adjusts her dose as  needed for weight gain.  BP control as above.     Current medicines are reviewed at length with the patient today.  The patient does not have concerns regarding medicines.   The following changes have been made: No changes.  Labs/ tests ordered today include:   Orders Placed This Encounter  Procedures  . EKG 12-Lead      Disposition:   FU with Joaovictor Krone C. Oval Linsey, MD, [facility] in 4 months.   This note was written with the assistance of speech recognition software.  Please excuse any transcriptional errors.  Signed, Belita Warsame C. Oval Linsey, MD, [facility]  02/06/2017 3:31 PM    [place]

## 2017-02-06 NOTE — Patient Instructions (Addendum)
Medication Instructions:  Your physician recommends that you continue on your current medications as directed. Please refer to the Current Medication list given to you today.  Labwork: none  Testing/Procedures: none  Follow-Up: Your physician recommends that you schedule a follow-up appointment in: 4 month ov   If you need a refill on your cardiac medications before your next appointment, please call your pharmacy.

## 2017-04-30 ENCOUNTER — Other Ambulatory Visit: Payer: Self-pay | Admitting: Cardiovascular Disease

## 2017-05-01 NOTE — Telephone Encounter (Signed)
Please review for refill, thanks ! 

## 2017-05-05 ENCOUNTER — Other Ambulatory Visit: Payer: Self-pay | Admitting: Gastroenterology

## 2017-05-11 NOTE — Progress Notes (Signed)
On 8/21,8/22, and 05/11/17-LVMM for patient to call Endoscopy.

## 2017-05-17 ENCOUNTER — Encounter (HOSPITAL_COMMUNITY): Payer: Self-pay | Admitting: *Deleted

## 2017-05-17 NOTE — Progress Notes (Signed)
Spoke with dr ellander anesthesia, made aware patient medical history and chest xray 10-31-16 results , do not need to repeat chest xray morning of procedure 05-24-17 per dr ellander anesthesia.

## 2017-05-18 ENCOUNTER — Other Ambulatory Visit: Payer: Self-pay | Admitting: Nurse Practitioner

## 2017-05-18 ENCOUNTER — Other Ambulatory Visit (HOSPITAL_COMMUNITY)
Admission: RE | Admit: 2017-05-18 | Discharge: 2017-05-18 | Disposition: A | Discharge status: Home / Self Care | Payer: BC Managed Care – PPO | Source: Ambulatory Visit | Attending: Nurse Practitioner | Admitting: Nurse Practitioner

## 2017-05-18 DIAGNOSIS — Z124 Encounter for screening for malignant neoplasm of cervix: Secondary | ICD-10-CM | POA: Insufficient documentation

## 2017-05-18 DIAGNOSIS — Z1231 Encounter for screening mammogram for malignant neoplasm of breast: Secondary | ICD-10-CM

## 2017-05-18 DIAGNOSIS — R87810 Cervical high risk human papillomavirus (HPV) DNA test positive: Secondary | ICD-10-CM | POA: Diagnosis not present

## 2017-05-23 ENCOUNTER — Other Ambulatory Visit: Payer: Self-pay | Admitting: Gastroenterology

## 2017-05-23 DIAGNOSIS — Z86010 Personal history of colonic polyps: Secondary | ICD-10-CM

## 2017-05-24 ENCOUNTER — Ambulatory Visit (HOSPITAL_COMMUNITY)
Admission: RE | Admit: 2017-05-24 | Payer: BC Managed Care – PPO | Source: Ambulatory Visit | Admitting: Gastroenterology

## 2017-05-24 HISTORY — DX: Anemia, unspecified: D64.9

## 2017-05-24 HISTORY — DX: Pneumonia, unspecified organism: J18.9

## 2017-05-24 HISTORY — DX: Pain in unspecified joint: M25.50

## 2017-05-24 SURGERY — COLONOSCOPY WITH PROPOFOL
Anesthesia: Monitor Anesthesia Care

## 2017-05-26 LAB — CYTOLOGY - PAP
Diagnosis: NEGATIVE
HPV 16/18/45 genotyping: NEGATIVE
HPV: DETECTED — AB

## 2017-05-31 ENCOUNTER — Inpatient Hospital Stay
Admission: RE | Admit: 2017-05-31 | Discharge: 2017-05-31 | Disposition: A | Discharge status: Home / Self Care | Payer: BC Managed Care – PPO | Source: Ambulatory Visit | Attending: Gastroenterology | Admitting: Gastroenterology

## 2017-06-05 ENCOUNTER — Ambulatory Visit
Admission: RE | Admit: 2017-06-05 | Discharge: 2017-06-05 | Disposition: A | Discharge status: Home / Self Care | Payer: BC Managed Care – PPO | Source: Ambulatory Visit | Attending: Nurse Practitioner | Admitting: Nurse Practitioner

## 2017-06-05 DIAGNOSIS — Z1231 Encounter for screening mammogram for malignant neoplasm of breast: Secondary | ICD-10-CM

## 2017-06-06 ENCOUNTER — Ambulatory Visit (INDEPENDENT_AMBULATORY_CARE_PROVIDER_SITE_OTHER): Payer: BC Managed Care – PPO | Admitting: Cardiovascular Disease

## 2017-06-06 ENCOUNTER — Encounter: Payer: Self-pay | Admitting: Cardiovascular Disease

## 2017-06-06 VITALS — BP 135/80 | HR 76 | Ht 66.0 in | Wt 349.2 lb

## 2017-06-06 DIAGNOSIS — Z5181 Encounter for therapeutic drug level monitoring: Secondary | ICD-10-CM | POA: Diagnosis not present

## 2017-06-06 DIAGNOSIS — I482 Chronic atrial fibrillation, unspecified: Secondary | ICD-10-CM

## 2017-06-06 DIAGNOSIS — I5032 Chronic diastolic (congestive) heart failure: Secondary | ICD-10-CM

## 2017-06-06 NOTE — Addendum Note (Signed)
Addended by: Alvina Filbert B on: 06/06/2017 05:25 PM   Modules accepted: Orders

## 2017-06-06 NOTE — Progress Notes (Signed)
Cardiology Office Note   Date:  06/06/2017   ID:  Hannah Khan, DOB November 17, 1955, MRN [id number]  PCP:  Lujean Amel, MD  Cardiologist:   Skeet Latch, MD  Dr. Paulita Fujita: GI Dr. Alen Blew: Hematology  No chief complaint on file.   Patient ID:  Hannah Khan is a 61 y.o. female with hypertension, chronic atrial fibrillation, diabetes mellitus type 2, hyperlipidemia, cryptogenic cirrhosis, thrombocytopenia, and morbid obesity who presents for follow-up.  Hannah Khan was admitted to the hospital 11/2015 with atrial fibrillation with rapid ventricular response. Given her history of portal hypertension and cirrhosis she was not anticoagulated. She was started on diltiazem for rate control and continued on her home propranolol. Her rate did not stay consistently less than 100 bpm so she was started on digoxin as well.  Echo revealed normal systolic function and her thyroid function was normal.  She was admitted to the hospital 0/3009 with diastolic heart failure.  She was diuresed with IV lasix and digoxin was discontinued due to hypokalemia.  She was seen in follow up 02/2016, at which time she complained of hypotension.  Her heart rate was well-controlled but she continued to feel poorly in AF.  She was referred to Dr. Rayann Heman for consideration of a Watchman device however, he felt that she would not be a good candidate, as she will only be able to be on antiplatelets for one month and she is unlikely to remain in sinus rhythm without significant weight loss.   Since her last appointment Hannah Khan has been doing well.  She only had palpitations one time.  This occurred in the setting of taking barium for a virtual colonoscopy.  She has otherwise been well.  Each time she tried taking the barium her heart rate increased to 150.  Since then she has been feeling well.  She has been busy with school.  She denies chest pain or shortness of breath.  She intermittently has edema. Today she hasn't taken  her diuretic so she has more edema.  She denies orthopnea or PND.  She is unclear of what causes the increased edema, as she continues to limit her sodium intake.She continues to weigh herself daily and increases bumetanide as needed.     Past Medical History:  Diagnosis Date  . Anemia    hx of  . Atrial fibrillation ([place])   . Blood dyscrasia   . Diabetes mellitus without complication ([place])    type 2  . Dysrhythmia    a fib  . Fibromyalgia   . HTN (hypertension)   . Hx of colonic polyps    s/p partial colectomy  . Hyperlipidemia   . ITP (idiopathic thrombocytopenic purpura)   . Joint pain    in knees and back spasms  . Left leg swelling    wear compression hose  . Liver cirrhosis secondary to NASH (nonalcoholic steatohepatitis) ([place]) dx nov 2014   had enlarged spleen also   . Low blood pressure    bp varies  . Morbid obesity ([place])   . Pneumonia feb 2018 weel now  . PONV (postoperative nausea and vomiting)    in past none recent  . Portal hypertension ([place])   . Shortness of breath dyspnea    history of with fluid overload takes diuretics improved now  . Sleep apnea    uses cpap setting of 14  . Sore on scalp    may bleed secondary to ITP comes and goes none now  .  Spleen enlarged     Past Surgical History:  Procedure Laterality Date  . CESAREAN SECTION  1989  . CHOLECYSTECTOMY  20 yrs ago  . COLECTOMY  2011  . DILATATION & CURETTAGE/HYSTEROSCOPY WITH MYOSURE N/A 05/06/2016   Procedure: DILATATION & CURETTAGE/HYSTEROSCOPY WITH MYOSURE WITH POLYPECTOMY;  Surgeon: Janyth Pupa, DO;  Location: [facility];  Service: Gynecology;  Laterality: N/A;  . ESOPHAGOGASTRODUODENOSCOPY (EGD) WITH PROPOFOL N/A 09/04/2013   Procedure: ESOPHAGOGASTRODUODENOSCOPY (EGD) WITH PROPOFOL;  Surgeon: [name], MD;  Location: [facility];  Service: Endoscopy;  Laterality: N/A;  . KNEE ARTHROSCOPY  yrs ago   bilateral, one done x 1, one done twice     Current Outpatient Prescriptions    Medication Sig Dispense Refill  . bumetanide (BUMEX) 2 MG tablet Take 4-6 mg by mouth daily. Take 4 mgs by mouth once daily, may take an additional 53m as needed for swelling    . Carboxymethylcellul-Glycerin (LUBRICATING EYE DROPS OP) Apply 1 drop to eye daily as needed (dry eyes).    . Coenzyme Q10 (CO Q 10) 100 MG CAPS Take 100 mg by mouth daily.    .Marland Kitchendiltiazem (CARDIZEM CD) 240 MG 24 hr capsule TAKE 1 CAPSULE (240 MG TOTAL) BY MOUTH DAILY. 30 capsule 2  . JARDIANCE 25 MG TABS tablet Take 25 mg by mouth daily.  5  . methocarbamol (ROBAXIN) 500 MG tablet Take 500-750 mg by mouth daily as needed for muscle spasms.     . Potassium Chloride ER 20 MEQ TBCR TAKE 2 TABLETS BY MOUTH TWICE A DAY (Patient taking differently: Take 20 mEq by mouth 2 (two) times daily. ) 360 tablet 3  . propranolol (INDERAL) 10 MG tablet Take 1 tablet (10 mg total) by mouth 2 (two) times daily. 180 tablet 3  . spironolactone (ALDACTONE) 25 MG tablet Take 25 mg by mouth daily.      No current facility-administered medications for this visit.     Allergies:   Celecoxib; Prednisone; Shellfish allergy; Statins; Barium-containing compounds; Ibuprofen; and Tylenol [acetaminophen]    Social History:  The patient  reports that she quit smoking about 42 years ago. Her smoking use included Cigarettes. She has a 3.00 pack-year smoking history. She has never used smokeless tobacco. She reports that she drinks about 0.6 oz of alcohol per week . She reports that she does not use drugs.   Family History:  [name] family history includes Cancer in her brother, father, and mother; Heart failure in her mother; Hyperlipidemia in her brother and mother; Lung cancer in her father; Stroke in her other.    ROS:  Please see the history of present illness.   Otherwise, review of systems are positive for none.   All other systems are reviewed and negative.    PHYSICAL EXAM: VS:  BP 135/80   Pulse 76   Ht 5' 6"  (1.676 m)   Wt (!)  158.4 kg (349 lb 3.2 oz)   BMI 56.36 kg/m  , BMI Body mass index is 56.36 kg/m. GENERAL:  Well appearing HEENT: Pupils equal round and reactive, fundi not visualized, oral mucosa unremarkable NECK:  No jugular venous distention, waveform within normal limits, carotid upstroke brisk and symmetric, no bruits, no thyromegaly LUNGS:  Clear to auscultation bilaterally HEART:  Irregularly irregu PMI not displaced or sustained,S1 and S2 within normal limits, no S3, no S4, no clicks, no rubs, II/VI systolic murmur at the LUSB ABD:  Flat, positive bowel sounds normal in frequency in pitch, no  bruits, no rebound, no guarding, no midline pulsatile mass, no hepatomegaly, no splenomegaly EXT:  2 plus pulses throughout, no edema, no cyanosis no clubbing SKIN:  No rashes no nodules NEURO:  Cranial nerves II through XII grossly intact, motor grossly intact throughout PSYCH:  Cognitively intact, oriented to person place and time   EKG:  EKG is ordered today.  06/06/16: Atrial fibrillation. Rate 93 bpm. Low voltage. 11/14/16: Atrial fibrillation. Rate 87 bpm. 02/07/79 atrial fibrillation. Rate 95 bpm. Low voltage precordial leads. Incomplete right bundle branch block. 06/06/17: Atrial fibrillation.  Rate 76 bpm.    Echo 12/10/15: Study Conclusions  - Procedure narrative: Transthoracic echocardiography. The study  was technically difficult, as a result of body habitus.  Intravenous contrast (Definity) was administered. - Left ventricle: The cavity size was normal. Wall thickness was  increased in a pattern of mild LVH. Systolic function was normal.  The estimated ejection fraction was in the range of 55% to 60%.  Wall motion was normal; there were no regional wall motion  abnormalities. The study is not technically sufficient to allow  evaluation of LV diastolic function. - Mitral valve: Mildly thickened leaflets . There was trivial  regurgitation. - Left atrium: The atrium was mildly dilated. -  Inferior vena cava: The vessel was dilated. The respirophasic  diameter changes were blunted (< 50%), consistent with elevated  central venous pressure. - Pericardium, extracardiac: A trivial pericardial effusion was  identified posterior to the heart.  Impressions:  - Technically difficult study. The patient was in atrial flutter  during the study. The LVEF is 55-60%, mild LVH, grossly normal  wall motion, trivial MR, mild LAE, dilated IVC, trivial posterior  pericardial effusion.  Recent Labs: 10/31/2016: ALT 17; B Natriuretic Peptide 232.8 11/02/2016: BUN 21; Creatinine, Ser 0.97; Magnesium 1.8; Potassium 3.3; Sodium 140 12/02/2016: HGB 12.5; Platelets 42    Lipid Panel    Component Value Date/Time   CHOL 175 07/02/2014 1138   TRIG 93 07/02/2014 1138   HDL 59 07/02/2014 1138   CHOLHDL 3.0 07/02/2014 1138   VLDL 19 07/02/2014 1138   LDLCALC 97 07/02/2014 1138      Wt Readings from Last 3 Encounters:  06/06/17 (!) 158.4 kg (349 lb 3.2 oz)  02/06/17 (!) 156 kg (344 lb)  12/02/16 (!) 154.7 kg (341 lb 1 oz)     ASSESSMENT AND PLAN:  # Persistent atrial fibrillation: Asymptomatic.  Continue diltiazem and propranolol.  Rate is well-controlled.  No anticoagulation 2/2 cirrhosis. This patients CHA2DS2-VASc Score and unadjusted Ischemic Stroke Rate (% per year) is equal to 3.2 % stroke rate/year from a score of 3 Above score calculated as 1 point each if present [CHF, HTN, DM, Vascular=MI/PAD/Aortic Plaque, Age if 65-74, or Female] Above score calculated as 2 points each if present [Age > 75, or Stroke/TIA/TE]  # Hypertension:  Blood pressure has been low in the past but was a little high today.  Continue diltiazem and propranolol.   # Chronic diastolic heart failure: Ms. Vazques has edema on exam today but hasn't taken bumex today.  She does a good job of weighing herself and adjusting as needed. Continue Bumex 23m bid.  She adjusts her dose as needed for weight gain.  BP  control as above.  Check a BMP.     Current medicines are reviewed at length with the patient today.  The patient does not have concerns regarding medicines.   The following changes have been made: No changes.  Labs/ tests  ordered today include:   Orders Placed This Encounter  Procedures  . Basic metabolic panel      Disposition:   FU with Devine Dant C. Oval Linsey, MD, [facility] in 6 months.   This note was written with the assistance of speech recognition software.  Please excuse any transcriptional errors.  Signed, Michaeleen Down C. Oval Linsey, MD, [facility]  06/06/2017 3:39 PM    [place]

## 2017-06-06 NOTE — Patient Instructions (Signed)
Medication Instructions:  Your physician recommends that you continue on your current medications as directed. Please refer to the Current Medication list given to you today.  Labwork: BMET Friday   Testing/Procedures: NONE  Follow-Up: Your physician wants you to follow-up in: [place] will receive a reminder letter in the mail two months in advance. If you don't receive a letter, please call our office to schedule the follow-up appointment.  If you need a refill on your cardiac medications before your next appointment, please call your pharmacy.

## 2017-06-07 ENCOUNTER — Other Ambulatory Visit: Payer: Self-pay | Admitting: Nurse Practitioner

## 2017-06-07 ENCOUNTER — Other Ambulatory Visit: Payer: Self-pay | Admitting: Gastroenterology

## 2017-06-07 DIAGNOSIS — K7460 Unspecified cirrhosis of liver: Secondary | ICD-10-CM

## 2017-06-07 DIAGNOSIS — R928 Other abnormal and inconclusive findings on diagnostic imaging of breast: Secondary | ICD-10-CM

## 2017-06-09 ENCOUNTER — Ambulatory Visit: Payer: BC Managed Care – PPO | Admitting: Cardiovascular Disease

## 2017-06-09 ENCOUNTER — Telehealth: Payer: Self-pay | Admitting: Oncology

## 2017-06-09 ENCOUNTER — Other Ambulatory Visit: Payer: BC Managed Care – PPO

## 2017-06-09 ENCOUNTER — Other Ambulatory Visit: Payer: Self-pay | Admitting: Cardiovascular Disease

## 2017-06-09 ENCOUNTER — Ambulatory Visit: Payer: BC Managed Care – PPO | Admitting: Oncology

## 2017-06-09 ENCOUNTER — Other Ambulatory Visit (HOSPITAL_BASED_OUTPATIENT_CLINIC_OR_DEPARTMENT_OTHER): Payer: BC Managed Care – PPO

## 2017-06-09 ENCOUNTER — Ambulatory Visit (HOSPITAL_BASED_OUTPATIENT_CLINIC_OR_DEPARTMENT_OTHER): Payer: BC Managed Care – PPO | Admitting: Oncology

## 2017-06-09 VITALS — BP 102/51 | HR 95 | Temp 97.8°F | Resp 18 | Ht 66.0 in | Wt 354.2 lb

## 2017-06-09 DIAGNOSIS — D61818 Other pancytopenia: Secondary | ICD-10-CM

## 2017-06-09 DIAGNOSIS — D696 Thrombocytopenia, unspecified: Secondary | ICD-10-CM

## 2017-06-09 DIAGNOSIS — I4891 Unspecified atrial fibrillation: Secondary | ICD-10-CM | POA: Diagnosis not present

## 2017-06-09 DIAGNOSIS — K7469 Other cirrhosis of liver: Secondary | ICD-10-CM | POA: Diagnosis not present

## 2017-06-09 LAB — CBC WITH DIFFERENTIAL/PLATELET
BASO%: 1.2 % (ref 0.0–2.0)
Basophils Absolute: 0 10*3/uL (ref 0.0–0.1)
EOS%: 1.6 % (ref 0.0–7.0)
Eosinophils Absolute: 0.1 10*3/uL (ref 0.0–0.5)
HCT: 37.2 % (ref 34.8–46.6)
HGB: 12.8 g/dL (ref 11.6–15.9)
LYMPH%: 26 % (ref 14.0–49.7)
MCH: 30.6 pg (ref 25.1–34.0)
MCHC: 34.4 g/dL (ref 31.5–36.0)
MCV: 89.1 fL (ref 79.5–101.0)
MONO#: 0.3 10*3/uL (ref 0.1–0.9)
MONO%: 9.3 % (ref 0.0–14.0)
NEUT%: 61.9 % (ref 38.4–76.8)
NEUTROS ABS: 2.1 10*3/uL (ref 1.5–6.5)
Platelets: 36 10*3/uL — ABNORMAL LOW (ref 145–400)
RBC: 4.17 10*6/uL (ref 3.70–5.45)
RDW: 15.4 % — ABNORMAL HIGH (ref 11.2–14.5)
WBC: 3.4 10*3/uL — AB (ref 3.9–10.3)
lymph#: 0.9 10*3/uL (ref 0.9–3.3)

## 2017-06-09 NOTE — Progress Notes (Signed)
Hematology and Oncology Follow Up Visit  Hannah Khan [id number] 1956/01/11 61 y.o. 06/09/2017 1:18 PM  CC: Hannah Silence, MD Hannah Khan, M.D. Hannah Khan. Hannah Pulling, MD Hannah Khan, M.D.    Principle Diagnosis: 61 year old female with the following issues:   1. Thrombocytopenia related to cirrhosis of the liver as well as platelet clumping. She is currently on observation and surveillance. 2.     Status post a partial colectomy due to a tubular adenoma, without any evidence of high-grade dysplasia or malignancy.   Current therapy: Observation, surveillance  Interim History: Hannah Khan presents today for a follow-up visit. Since her last visit, she Rreports no recent changes in her health. She was involved in a long driving trip during the summer for about a month. She is able to tolerate that without any issues. She denied any difficulty breathing or chest pain. She denied any bleeding complications. She continues to have atrial fibrillation and not chronically anticoagulated because of thrombocytopenia. She is currently rate controlled and relatively asymptomatic. She had reported symptoms of fatigue which has been manageable. She does not report any hematochezia, melena or hematuria.  She does not report any headaches, blurry vision, syncope or seizures. She has not reported any decline in her energy her performance status. She does not report any fevers or chills or sweats.She has not reported any chest pain or shortness of breath. She does not report any nausea, vomiting or abdominal pain. Does not report any cough or hemoptysis.rest of her review of systems unremarkable.  Medications: I have reviewed the patient's current medications.  Current Outpatient Prescriptions  Medication Sig Dispense Refill  . bumetanide (BUMEX) 2 MG tablet Take 4-6 mg by mouth daily. Take 4 mgs by mouth once daily, may take an additional 40m as needed for swelling    . Carboxymethylcellul-Glycerin  (LUBRICATING EYE DROPS OP) Apply 1 drop to eye daily as needed (dry eyes).    . Coenzyme Q10 (CO Q 10) 100 MG CAPS Take 100 mg by mouth daily.    .Marland Kitchendiltiazem (CARDIZEM CD) 240 MG 24 hr capsule TAKE 1 CAPSULE (240 MG TOTAL) BY MOUTH DAILY. 30 capsule 2  . JARDIANCE 25 MG TABS tablet Take 25 mg by mouth daily.  5  . KLOR-CON M20 20 MEQ tablet Take 40 mEq by mouth 2 (two) times daily.  3  . methocarbamol (ROBAXIN) 500 MG tablet Take 500-750 mg by mouth daily as needed for muscle spasms.     . Potassium Chloride ER 20 MEQ TBCR TAKE 2 TABLETS BY MOUTH TWICE A DAY (Patient taking differently: Take 20 mEq by mouth 2 (two) times daily. ) 360 tablet 3  . propranolol (INDERAL) 10 MG tablet Take 1 tablet (10 mg total) by mouth 2 (two) times daily. 180 tablet 3  . spironolactone (ALDACTONE) 25 MG tablet Take 25 mg by mouth daily.      No current facility-administered medications for this visit.     Allergies:  Allergies  Allergen Reactions  . Celecoxib Other (See Comments)    "speech slurred" with celebrex  . Prednisone Other (See Comments)    Rapid heart rate  . Shellfish Allergy Swelling    Lips tingle and swell. Whelps the size of quarters.   . Statins Other (See Comments)    aggravated fibromyalgia  . Barium-Containing Compounds     tachycardia  . Ibuprofen     Due to platelets  . Tylenol [Acetaminophen]     Due to platelets  Past Medical History, Surgical history, Social history, and Family History were reviewed and updated.   Physical Exam: Blood pressure (!) 154/72, pulse 95, temperature 97.8 F (36.6 C), temperature source Oral, resp. rate 18, height 5' 6"  (1.676 m), weight (!) 354 lb 3.2 oz (160.7 kg), SpO2 97 %. ECOG: 1 General appearance: A well-appearing woman without distress. s. Head: Normocephalic, without obvious abnormality no thrush or ulcers. Neck: no adenopathy Lymph nodes: Cervical, supraclavicular, and axillary nodes normal. Heart: Tachycardic without  murmurs. Lung:chest clear, no wheezing, rales, normal symmetric air entry Abdomen: soft, non-tender, without masses or organomegaly no rebound or guarding. EXT:no erythema, induration, or nodules. In: No rashes, lesions or petechiae.   Lab Results: Lab Results  Component Value Date   WBC 3.4 (L) 06/09/2017   HGB 12.8 06/09/2017   HCT 37.2 06/09/2017   MCV 89.1 06/09/2017   PLT 36 (L) 06/09/2017     Chemistry      Component Value Date/Time   NA 140 11/02/2016 0502   NA 142 08/01/2014 1509   K 3.3 (L) 11/02/2016 0502   K 4.1 08/01/2014 1509   CL 103 11/02/2016 0502   CL 107 08/03/2012 1449   CO2 28 11/02/2016 0502   CO2 23 08/01/2014 1509   BUN 21 (H) 11/02/2016 0502   BUN 16.9 08/01/2014 1509   CREATININE 0.97 11/02/2016 0502   CREATININE 0.77 06/28/2016 1157   CREATININE 0.7 08/01/2014 1509      Component Value Date/Time   CALCIUM 8.8 (L) 11/02/2016 0502   CALCIUM 9.3 08/01/2014 1509   ALKPHOS 61 10/31/2016 1717   ALKPHOS 82 08/01/2014 1509   AST 33 10/31/2016 1717   AST 54 (H) 08/01/2014 1509   ALT 17 10/31/2016 1717   ALT 30 08/01/2014 1509   BILITOT 2.5 (H) 10/31/2016 1717   BILITOT 1.71 (H) 08/01/2014 2374       61 year old with the following issues:     1. Thrombocytopenia due to cirrhosis of the liver. Her platelet count continues to be stable without active bleeding. Platelet transfusion or growth factors support can be used to boost her platelet count if any procedures at this. In the future. 2. Atrial fibrillation: She continues to follow with cardiology regarding this issue. She is not a candidate for full dose anticoagulation given her bleeding risk from liver disease and thrombocytopenia. 3. Cirrhosis of the liver: Follow up with gastroenterology regarding this issue. 4. Colonoscopy screening: She is due to have a repeat colonoscopy in the near future. Her last colonoscopy this year was canceled because of thrombocytopenia. Her platelet count is  adequate at this time and if he needed to be higher, platelet transfusion can be used in preparation to have that procedure. 5. Follow up: in 6 months scan sooner if needed to.    Zola Button 9/21/20181:18 PM

## 2017-06-09 NOTE — Telephone Encounter (Signed)
Gave avs and calendar for march 2019 

## 2017-06-13 ENCOUNTER — Other Ambulatory Visit: Payer: Self-pay | Admitting: Nurse Practitioner

## 2017-06-13 ENCOUNTER — Ambulatory Visit
Admission: RE | Admit: 2017-06-13 | Discharge: 2017-06-13 | Disposition: A | Discharge status: Home / Self Care | Payer: BC Managed Care – PPO | Source: Ambulatory Visit | Attending: Nurse Practitioner | Admitting: Nurse Practitioner

## 2017-06-13 DIAGNOSIS — N6310 Unspecified lump in the right breast, unspecified quadrant: Secondary | ICD-10-CM

## 2017-06-13 DIAGNOSIS — R928 Other abnormal and inconclusive findings on diagnostic imaging of breast: Secondary | ICD-10-CM

## 2017-06-15 LAB — BASIC METABOLIC PANEL
BUN / CREAT RATIO: 18 (ref 12–28)
BUN: 17 mg/dL (ref 8–27)
CALCIUM: 9.1 mg/dL (ref 8.7–10.3)
CO2: 22 mmol/L (ref 20–29)
Chloride: 101 mmol/L (ref 96–106)
Creatinine, Ser: 0.95 mg/dL (ref 0.57–1.00)
GFR calc Af Amer: 75 mL/min/{1.73_m2} (ref >59)
GFR, EST NON AFRICAN AMERICAN: 65 mL/min/{1.73_m2} (ref >59)
Glucose: 120 mg/dL — ABNORMAL HIGH (ref 65–99)
POTASSIUM: 3.7 mmol/L (ref 3.5–5.2)
SODIUM: 139 mmol/L (ref 134–144)

## 2017-06-15 LAB — SPECIMEN STATUS REPORT

## 2017-06-19 ENCOUNTER — Ambulatory Visit
Admission: RE | Admit: 2017-06-19 | Discharge: 2017-06-19 | Disposition: A | Discharge status: Home / Self Care | Payer: BC Managed Care – PPO | Source: Ambulatory Visit | Attending: Nurse Practitioner | Admitting: Nurse Practitioner

## 2017-06-19 ENCOUNTER — Other Ambulatory Visit: Payer: Self-pay | Admitting: Nurse Practitioner

## 2017-06-19 DIAGNOSIS — N6310 Unspecified lump in the right breast, unspecified quadrant: Secondary | ICD-10-CM

## 2017-06-23 ENCOUNTER — Ambulatory Visit: Payer: Self-pay | Admitting: Surgery

## 2017-06-23 DIAGNOSIS — N6091 Unspecified benign mammary dysplasia of right breast: Secondary | ICD-10-CM

## 2017-06-23 NOTE — H&P (Signed)
Hannah Khan 06/23/2017 10:29 AM Location: [facility] Patient #: [id number] DOB: [birth year] Married / Language: Cleophus Molt / Race: White Female  History of Present Illness Marcello Moores A. Linnet Bottari MD; 06/23/2017 11:55 AM) Patient words: Patient sent other request of Dr. Dorise Bullion for abnormal mammogram. Patient was noted to have an area in her right breast with 4 small masses were found to be atypical. Should require biopsy of the largest which showed atypical ductal hyperplasia with in a papilloma. Either masses were palpable within 1-2 cm of the initial mass. She also calcified mass appeared to be a fibroadenoma. She has multiple medical problems including cirrhosis and thrombocytopenia which is chronic. Her liver function is remaining stable. Patient denies any history of breast pain and discharge or mass.      CLINICAL DATA: The patient was called back from screening mammography due to right breast masses.  EXAM: DIGITAL DIAGNOSTIC RIGHT MAMMOGRAM WITH CAD  ULTRASOUND RIGHT BREAST  COMPARISON: Previous exam(s).  ACR Breast Density Category b: There are scattered areas of fibroglandular density.  FINDINGS: There are 4 new right breast masses in the medial right breast. The most anterior of these masses measures 7 mm. The largest of these masses measures 12 mm. Two tiny masses are seen most posteriorly with the larger of these 2 masses measuring 4 mm. There is also a partially calcified mass in this region which is almost certainly a benign calcifying fibroadenoma.  Mammographic images were processed with CAD.  On physical exam, no suspicious lumps are identified.  Targeted ultrasound is performed, showing a mass in the right breast 1 o'clock, 3 cm from the nipple measuring 6 x 5 x 6 mm with internal blood flow. There is another mass at 1:30, 8 cm from the nipple measuring 3 x 2 x 3 mm. There is an adjacent tiny mass at 1:30, 8 cm from the nipple measures  2 x 2 x 2 mm. The largest mass seen at 1:30, 6 cm from the nipple measuring 9 x 7 x 11 mm. The 2 largest masses demonstrate internal blood flow. The calcifying right breast mass, likely a calcifying fibroadenoma is also identified. No axillary adenopathy.  IMPRESSION: 4 new masses in the medial right breast.  RECOMMENDATION: Recommend ultrasound-guided biopsy of the 2 largest new right breast masses at 1 o'clock, 3 cm from the nipple and at 1:30, 6 cm from the nipple. If these biopsies result in the need for surgery, consider biopsying 1 of the more posterior tiny masses as all 4 will likely need to be removed.  I have discussed the findings and recommendations with the patient. Results were also provided in writing at the conclusion of the visit. If applicable, a reminder letter will be sent to the patient regarding the next appointment.  BI-RADS CATEGORY 4: Suspicious.   Electronically Signed By: Dorise Bullion III M.D On: 06/13/2017 17:50                      Diagnosis Breast, right, needle core biopsy, 1:30 o'clock - ATYPICAL DUCTAL HYPERPLASIA INVOLVING AN INTRADUCTAL PAPILLOMA. Microscopic Comment Dr. Lyndon Code has reviewed the case. The case was called to [facility] on 06/20/2017. Vicente Males MD Pathologist, Electronic Signature (Case signed 06/20/2017) Specimen Gross and Clinical Information Specimen Comment In formalin 3:50 extracted less than 1 min; mass Specimen(s) Obtained: Breast, right, needle core biopsy, 1:30 o'clock Specimen Clinical.  The patient is a 61 year old female.   Allergies Yvetta Coder  Kristian Covey, RMA; 06/23/2017 10:30 AM) CeleBREX *ANALGESICS - ANTI-INFLAMMATORY* Statins PredniSONE *CORTICOSTEROIDS* Allergies Reconciled  Medication History (Tanisha A. Owens Shark, [place]; 06/23/2017 10:30 AM) Furosemide (40MG Tablet, Oral) Active. Propranolol HCl (10MG Tablet, Oral) Active. Spironolactone (25MG Tablet,  Oral) Active. Methocarbamol (500MG Tablet, Oral) Active. CoQ10 (100MG Capsule, Oral) Active. Vitamin B12 (100MCG Tablet, Oral) Active. Vitamin D (1000UNIT Tablet, Oral) Active. Medications Reconciled    Vitals (Tanisha A. Brown RMA; 06/23/2017 10:30 AM) 06/23/2017 10:29 AM Weight: 330.2 lb Height: 67.5in Body Surface Area: 2.52 m Body Mass Index: 50.95 kg/m  Temp.: 98.28F  Pulse: 103 (Regular)  BP: 118/68 (Sitting, Left Arm, Standard)      Physical Exam (Alanni Vader A. Julie Nay MD; 06/23/2017 11:53 AM)  General Mental Status-Alert. General Appearance-Consistent with stated age. Hydration-Well hydrated. Voice-Normal.  Head and Neck Head-normocephalic, atraumatic with no lesions or palpable masses. Trachea-midline. Thyroid Gland Characteristics - normal size and consistency.  Chest and Lung Exam Chest and lung exam reveals -quiet, even and easy respiratory effort with no use of accessory muscles and on auscultation, normal breath sounds, no adventitious sounds and normal vocal resonance. Inspection Chest Wall - Normal. Back - normal.  Breast Note: Bruising right medial breast. Masses NONE. Left breast is normal.  Cardiovascular Cardiovascular examination reveals -normal heart sounds, regular rate and rhythm with no murmurs and normal pedal pulses bilaterally.  Abdomen Note: Obese  Neurologic Neurologic evaluation reveals -alert and oriented x 3 with no impairment of recent or remote memory. Mental Status-Normal.  Lymphatic Head & Neck  General Head & Neck Lymphatics: Bilateral - Description - Normal. Axillary  General Axillary Region: Bilateral - Description - Normal. Tenderness - Non Tender.    Assessment & Plan (Curvin Hunger A. 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 There is risk wITH lumpectomy therefore recommend right breast  lumpectomy. This area can be bracketed to remove the other areas as well. She has an increased operative risk due to her thrombocytopenia, cirrhosis, sleep apnea, morbid obesity and other comorbidities. We'll obtain anesthesia clearance. We will obtain cardiac clearance. Discussed increased risk of bleeding and observation as an alternative. She voiced her understanding but would like to proceed with lumpectomy. Risk of lumpectomy include bleeding, infection, seroma, more surgery, use of seed/wire, wound care, cosmetic deformity and the need for other treatments, death , blood clots, death. Pt agrees to proceed.  Current Plans Pt Education - [facility] Breast Biopsy HCI: discussed with patient and provided information. Pt Education - [facility] breast cancer surgery: discussed with patient and provided information. PORTAL HYPERTENSION (K76.6)  CIRRHOSIS (K74.60)  THROMBOCYTOPENIA (D69.6)  CHRONIC A-FIB (I48.2)

## 2017-06-27 ENCOUNTER — Telehealth: Payer: Self-pay | Admitting: *Deleted

## 2017-06-27 NOTE — Telephone Encounter (Signed)
   [facility] Pre-operative Risk Assessment    Request for surgical clearance:  1. What type of surgery is being performed? Breast lumpectomy     2. When is this surgery scheduled? TBD    3. Are there any medications that need to be held prior to surgery and how long?    4. Practice name and name of physician performing surgery? [facility] Dr Brantley Stage    5. What is your office phone and fax number? [phone] fax [phone] attn Carlene Coria, [place]   6. Anesthesia type (None, local, MAC, general) ? general anesthesia    ___________________________________________

## 2017-06-27 NOTE — Telephone Encounter (Signed)
Risk of 1 but does meet criteria of 4 mets.       Chart reviewed as part of pre-operative protocol coverage. Given past medical history and time since last visit, based on ACC/AHA guidelines, Hannah Khan would be at acceptable risk for the planned procedure without further cardiovascular testing.   She is in chronic atrial fib and at times it does become rapid.  Currently controlled. On no anticoagulation.    Cecilie Kicks, NP 06/27/2017, 5:24 PM

## 2017-06-27 NOTE — Telephone Encounter (Signed)
Sent clearance via [employer]

## 2017-07-07 ENCOUNTER — Ambulatory Visit
Admission: RE | Admit: 2017-07-07 | Discharge: 2017-07-07 | Disposition: A | Discharge status: Home / Self Care | Payer: BC Managed Care – PPO | Source: Ambulatory Visit | Attending: Gastroenterology | Admitting: Gastroenterology

## 2017-07-07 DIAGNOSIS — K7460 Unspecified cirrhosis of liver: Secondary | ICD-10-CM

## 2017-07-11 ENCOUNTER — Other Ambulatory Visit: Payer: Self-pay | Admitting: *Deleted

## 2017-07-11 MED ORDER — DILTIAZEM HCL ER COATED BEADS 240 MG PO CP24: 240.0000 mg | ORAL_CAPSULE | Freq: Every day | ORAL | 2 refills | Status: DC

## 2017-07-12 ENCOUNTER — Telehealth: Payer: Self-pay | Admitting: *Deleted

## 2017-07-12 NOTE — Telephone Encounter (Signed)
No note

## 2017-07-12 NOTE — Telephone Encounter (Signed)
Faxed signed clearance for right breast lumpectomy, from oncologist point of view, per dr Alen Blew.

## 2017-07-14 ENCOUNTER — Other Ambulatory Visit: Payer: Self-pay | Admitting: Surgery

## 2017-07-14 ENCOUNTER — Other Ambulatory Visit (HOSPITAL_COMMUNITY): Payer: Self-pay | Admitting: *Deleted

## 2017-07-14 DIAGNOSIS — N6091 Unspecified benign mammary dysplasia of right breast: Secondary | ICD-10-CM

## 2017-07-14 NOTE — Pre-Procedure Instructions (Addendum)
LETTA CARGILE  07/14/2017    Your procedure is scheduled on Tuesday, July 25, 2017 at 11:30 AM.   Report to [facility] Entrance "A" Admitting Office at 9:30 AM.   Call this number if you have problems the morning of surgery: [phone]   Questions prior to day of surgery, please call [phone] between 8 & 4 PM.   Remember:  Do not eat food or drink liquids after midnight Monday, 07/24/17.  Take these medicines the morning of surgery with A SIP OF WATER: Diltiazem (Cardizem), Propanolol (Inderal)  Stop Herbal medications 5 days prior to surgery.  Do not use NSAIDS (Ibuprofen, Aleve, etc) or Aspirin products prior to surgery.  Do not take your Jardiance the morning of surgery.  Drink the 8 ounce bottle of water 2 hours prior to arrival time day of surgery. Have it completed by 7:30 AM on day of surgery.   How to Manage Your Diabetes Before Surgery   Why is it important to control my blood sugar before and after surgery?   Improving blood sugar levels before and after surgery helps healing and can limit problems.  A way of improving blood sugar control is eating a healthy diet by:  - Eating less sugar and carbohydrates  - Increasing activity/exercise  - Talk with your doctor about reaching your blood sugar goals  High blood sugars (greater than 180 mg/dL) can raise your risk of infections and slow down your recovery so you will need to focus on controlling your diabetes during the weeks before surgery.  Make sure that the doctor who takes care of your diabetes knows about your planned surgery including the date and location.  How do I manage my blood sugars before surgery?   Check your blood sugar at least 4 times a day, 2 days before surgery to make sure that they are not too high or low.  Check your blood sugar the morning of your surgery when you wake up and every 2 hours until you get to the Short-Stay unit.  Treat a low blood sugar (less than 70  mg/dL) with 1/2 cup of clear juice (cranberry or apple), 4 glucose tablets, OR glucose gel.  Recheck blood sugar in 15 minutes after treatment (to make sure it is greater than 70 mg/dL).  If blood sugar is not greater than 70 mg/dL on re-check, call [phone] for further instructions.   Report your blood sugar to the Short-Stay nurse when you get to Short-Stay.  References:  [facility], 2007 "How to Manage your Diabetes Before and After Surgery".   Do not wear jewelry, make-up or nail polish.  Do not wear lotions, powders, perfumes or deodorant.  Do not shave 48 hours prior to surgery.    Do not bring valuables to the hospital.  [facility] is not responsible for any belongings or valuables.  Contacts, dentures or bridgework may not be worn into surgery.  Leave your suitcase in the car.  After surgery it may be brought to your room.  For patients admitted to the hospital, discharge time will be determined by your treatment team.  Patients discharged the day of surgery will not be allowed to drive home.   [place] - Preparing for Surgery  Before surgery, you can play an important role.  Because skin is not sterile, your skin needs to be as free of germs as possible.  You can reduce the number of germs on you skin by  washing with CHG (chlorahexidine gluconate) soap before surgery.  CHG is an antiseptic cleaner which kills germs and bonds with the skin to continue killing germs even after washing.  Please DO NOT use if you have an allergy to CHG or antibacterial soaps.  If your skin becomes reddened/irritated stop using the CHG and inform your nurse when you arrive at Short Stay.  Do not shave (including legs and underarms) for at least 48 hours prior to the first CHG shower.  You may shave your face.  Please follow these instructions carefully:   1.  Shower with CHG Soap the night before surgery and the                    morning of Surgery.  2.  If you  choose to wash your hair, wash your hair first as usual with your       normal shampoo.  3.  After you shampoo, rinse your hair and body thoroughly to remove the shampoo.  4.  Use CHG as you would any other liquid soap.  You can apply chg directly       to the skin and wash gently with scrungie or a clean washcloth.  5.  Apply the CHG Soap to your body ONLY FROM THE NECK DOWN.        Do not use on open wounds or open sores.  Avoid contact with your eyes, ears, mouth and genitals (private parts).  Wash genitals (private parts) with your normal soap.  6.  Wash thoroughly, paying special attention to the area where your surgery        will be performed.  7.  Thoroughly rinse your body with warm water from the neck down.  8.  DO NOT shower/wash with your normal soap after using and rinsing off       the CHG Soap.  9.  Pat yourself dry with a clean towel.            10.  Wear clean pajamas.            11.  Place clean sheets on your bed the night of your first shower and do not        sleep with pets.  Day of Surgery  Do not apply any lotions/deodorants the morning of surgery.  Please wear clean clothes to the hospital.   Please read over the fact sheets that you were given.

## 2017-07-17 ENCOUNTER — Encounter (HOSPITAL_COMMUNITY): Payer: Self-pay

## 2017-07-17 ENCOUNTER — Encounter (HOSPITAL_COMMUNITY)
Admission: RE | Admit: 2017-07-17 | Discharge: 2017-07-17 | Disposition: A | Discharge status: Home / Self Care | Payer: BC Managed Care – PPO | Source: Ambulatory Visit | Attending: Surgery | Admitting: Surgery

## 2017-07-17 ENCOUNTER — Encounter (HOSPITAL_COMMUNITY): Payer: Self-pay | Admitting: Emergency Medicine

## 2017-07-17 DIAGNOSIS — Z01812 Encounter for preprocedural laboratory examination: Secondary | ICD-10-CM | POA: Diagnosis present

## 2017-07-17 DIAGNOSIS — I481 Persistent atrial fibrillation: Secondary | ICD-10-CM | POA: Insufficient documentation

## 2017-07-17 DIAGNOSIS — I10 Essential (primary) hypertension: Secondary | ICD-10-CM | POA: Diagnosis not present

## 2017-07-17 DIAGNOSIS — Z79899 Other long term (current) drug therapy: Secondary | ICD-10-CM | POA: Diagnosis not present

## 2017-07-17 DIAGNOSIS — E785 Hyperlipidemia, unspecified: Secondary | ICD-10-CM | POA: Insufficient documentation

## 2017-07-17 DIAGNOSIS — G4733 Obstructive sleep apnea (adult) (pediatric): Secondary | ICD-10-CM | POA: Insufficient documentation

## 2017-07-17 DIAGNOSIS — D693 Immune thrombocytopenic purpura: Secondary | ICD-10-CM | POA: Insufficient documentation

## 2017-07-17 DIAGNOSIS — K7581 Nonalcoholic steatohepatitis (NASH): Secondary | ICD-10-CM | POA: Insufficient documentation

## 2017-07-17 DIAGNOSIS — D649 Anemia, unspecified: Secondary | ICD-10-CM | POA: Insufficient documentation

## 2017-07-17 DIAGNOSIS — E1169 Type 2 diabetes mellitus with other specified complication: Secondary | ICD-10-CM | POA: Diagnosis not present

## 2017-07-17 HISTORY — DX: Unspecified osteoarthritis, unspecified site: M19.90

## 2017-07-17 LAB — COMPREHENSIVE METABOLIC PANEL
ALBUMIN: 3.5 g/dL (ref 3.5–5.0)
ALK PHOS: 80 U/L (ref 38–126)
ALT: 22 U/L (ref 14–54)
ANION GAP: 10 (ref 5–15)
AST: 36 U/L (ref 15–41)
BILIRUBIN TOTAL: 2.2 mg/dL — AB (ref 0.3–1.2)
BUN: 17 mg/dL (ref 6–20)
CALCIUM: 9.1 mg/dL (ref 8.9–10.3)
CO2: 24 mmol/L (ref 22–32)
Chloride: 105 mmol/L (ref 101–111)
Creatinine, Ser: 1.04 mg/dL — ABNORMAL HIGH (ref 0.44–1.00)
GFR, EST NON AFRICAN AMERICAN: 57 mL/min — AB (ref >60)
Glucose, Bld: 138 mg/dL — ABNORMAL HIGH (ref 65–99)
POTASSIUM: 3.6 mmol/L (ref 3.5–5.1)
Sodium: 139 mmol/L (ref 135–145)
TOTAL PROTEIN: 7.1 g/dL (ref 6.5–8.1)

## 2017-07-17 LAB — CBC WITH DIFFERENTIAL/PLATELET
BASOS PCT: 1 %
Basophils Absolute: 0 10*3/uL (ref 0.0–0.1)
Eosinophils Absolute: 0.1 10*3/uL (ref 0.0–0.7)
Eosinophils Relative: 2 %
HEMATOCRIT: 38.7 % (ref 36.0–46.0)
HEMOGLOBIN: 12.8 g/dL (ref 12.0–15.0)
LYMPHS ABS: 0.9 10*3/uL (ref 0.7–4.0)
Lymphocytes Relative: 20 %
MCH: 29.8 pg (ref 26.0–34.0)
MCHC: 33.1 g/dL (ref 30.0–36.0)
MCV: 90.2 fL (ref 78.0–100.0)
MONO ABS: 0.3 10*3/uL (ref 0.1–1.0)
MONOS PCT: 8 %
NEUTROS ABS: 3.1 10*3/uL (ref 1.7–7.7)
Neutrophils Relative %: 70 %
Platelets: 27 10*3/uL — CL (ref 150–400)
RBC: 4.29 MIL/uL (ref 3.87–5.11)
RDW: 15.1 % (ref 11.5–15.5)
WBC: 4.5 10*3/uL (ref 4.0–10.5)

## 2017-07-17 LAB — GLUCOSE, CAPILLARY: GLUCOSE-CAPILLARY: 128 mg/dL — AB (ref 65–99)

## 2017-07-19 ENCOUNTER — Telehealth: Payer: Self-pay | Admitting: *Deleted

## 2017-07-19 ENCOUNTER — Other Ambulatory Visit: Payer: Self-pay | Admitting: *Deleted

## 2017-07-19 DIAGNOSIS — D61818 Other pancytopenia: Secondary | ICD-10-CM

## 2017-07-19 NOTE — Progress Notes (Signed)
Anesthesia Chart Review:  Pt is a 61 year old female scheduled for R breast lumpectomy with radioactive seed localization on 07/25/2017 with Erroll Luna, MD  - PCP is Dibas Dorthy Cooler, MD - GI is [name], MD - Cardiologist is Skeet Latch, MD. Pt cleared for surgery 06/27/17 by Cecilie Kicks, NP  - Hematologist is Zola Button, MD. Given pt's chronically low platelets due to known ITP, he has arranged for pt to receive platelet transfusion 07/24/17.   PMH includes:  Persistent atrial fibrillation, HTN, DM, hyperlipidemia, liver cirrhosis due to NASH, portal hypertension, OSA, ITP, anemia.   Medications include: bumex, diltiazem, jardiance, potassium, propranolol, spironolactone.   BP (!) 127/55   Pulse (!) 101 Comment: patient states that she is in constant Afib  Temp 36.6 C   Resp 20   Ht 5' 7"  (1.702 m)   Wt (!) 356 lb 4.8 oz (161.6 kg)   SpO2 97%   BMI 55.80 kg/m   Preoperative labs reviewed.   - platelets 27.  This is consistent with prior results.  Pt has known ITP.  Will get platelet transfusion 07/24/17 prior to radioactive seed placement. Will recheck CBC day of surgery.   EKG 06/06/17: atrial fibrillation. RBBB.   Echo 12/10/15:  - Procedure narrative: Transthoracic echocardiography. The study was technically difficult, as a result of body habitus. Intravenous contrast (Definity) was administered. - Left ventricle: The cavity size was normal. Wall thickness was increased in a pattern of mild LVH. Systolic function was normal. The estimated ejection fraction was in the range of 55% to 60%. Wall motion was normal; there were no regional wall motion abnormalities. The study is not technically sufficient to allow evaluation of LV diastolic function. - Mitral valve: Mildly thickened leaflets . There was trivial regurgitation. - Left atrium: The atrium was mildly dilated. - Inferior vena cava: The vessel was dilated. The respirophasic diameter changes were blunted (< 50%),  consistent with elevated central venous pressure. - Pericardium, extracardiac: A trivial pericardial effusion was   identified posterior to the heart. - Impressions: Technically difficult study. The patient was in atrial flutter during the study. The LVEF is 55-60%, mild LVH, grossly normal wall motion, trivial MR, mild LAE, dilated IVC, trivial posterior pericardial effusion.  Willeen Cass, FNP-BC [facility] Short Stay Surgical Center/Anesthesiology Phone: [phone] 07/19/2017 4:41 PM

## 2017-07-19 NOTE — Telephone Encounter (Signed)
Per Dr. Alen Blew, I arranged for patient to have a platelet transfusion at [place] on Monday, November, 5th @ 09:00 am. Orders given and entered into [place]. I called and spoke with patient and gave her the appointment time, date and directions to [facility]. Patient verbalized understanding. Also, I called Abigail Butts at [facility] and updated her.

## 2017-07-19 NOTE — Telephone Encounter (Signed)
Please let her know that a platelet transfusion before surgery is all she needs. We can help with that if they want [place] to.

## 2017-07-19 NOTE — Telephone Encounter (Signed)
Received voice mail message stating," Girtha Hake from [facility] and I work with Dr. Brantley Stage. Dr. Brantley Stage is going to do surgery, a lumpectomy, on Hannah Khan November 6th. Her platelet count is 27. The patient stated that her platelets are always low. Dr. Brantley Stage will NOT do surgery unless the platelet count is above 50. What does Dr. Alen Blew recommend? A platelet transfusion? Dr. Alen Blew can call me at  [phone] or if he needs to speak with Dr. Brantley Stage I can get him. Thank you."

## 2017-07-24 ENCOUNTER — Ambulatory Visit (HOSPITAL_COMMUNITY)
Admission: RE | Admit: 2017-07-24 | Discharge: 2017-07-24 | Disposition: A | Discharge status: Home / Self Care | Payer: BC Managed Care – PPO | Source: Ambulatory Visit | Attending: Oncology | Admitting: Oncology

## 2017-07-24 ENCOUNTER — Inpatient Hospital Stay: Admission: RE | Admit: 2017-07-24 | Payer: BC Managed Care – PPO | Source: Ambulatory Visit

## 2017-07-24 DIAGNOSIS — D61818 Other pancytopenia: Secondary | ICD-10-CM | POA: Insufficient documentation

## 2017-07-24 LAB — CBC WITH DIFFERENTIAL/PLATELET
Basophils Absolute: 0 10*3/uL (ref 0.0–0.1)
Basophils Relative: 1 %
EOS ABS: 0.1 10*3/uL (ref 0.0–0.7)
EOS PCT: 1 %
HCT: 35.2 % — ABNORMAL LOW (ref 36.0–46.0)
Hemoglobin: 11.8 g/dL — ABNORMAL LOW (ref 12.0–15.0)
LYMPHS PCT: 21 %
Lymphs Abs: 0.8 10*3/uL (ref 0.7–4.0)
MCH: 30.3 pg (ref 26.0–34.0)
MCHC: 33.5 g/dL (ref 30.0–36.0)
MCV: 90.3 fL (ref 78.0–100.0)
MONO ABS: 0.4 10*3/uL (ref 0.1–1.0)
MONOS PCT: 9 %
Neutro Abs: 2.6 10*3/uL (ref 1.7–7.7)
Neutrophils Relative %: 68 %
PLATELETS: 25 10*3/uL — AB (ref 150–400)
RBC: 3.9 MIL/uL (ref 3.87–5.11)
RDW: 15 % (ref 11.5–15.5)
WBC: 3.8 10*3/uL — ABNORMAL LOW (ref 4.0–10.5)

## 2017-07-24 LAB — ABO/RH: ABO/RH(D): O POS

## 2017-07-24 MED ORDER — DIPHENHYDRAMINE HCL 25 MG PO CAPS
25.0000 mg | ORAL_CAPSULE | Freq: Once | ORAL | Status: AC
  Administered 2017-07-24: 25 mg via ORAL
  Filled 2017-07-24: qty 1

## 2017-07-24 MED ORDER — ACETAMINOPHEN 325 MG PO TABS
650.0000 mg | ORAL_TABLET | Freq: Once | ORAL | Status: AC
  Administered 2017-07-24: 650 mg via ORAL
  Filled 2017-07-24: qty 2

## 2017-07-24 MED ORDER — SODIUM CHLORIDE 0.9 % IV SOLN: 250.0000 mL | Freq: Once | INTRAVENOUS | Status: DC

## 2017-07-24 MED ORDER — DEXTROSE 5 % IV SOLN
3.0000 g | INTRAVENOUS | Status: DC
  Filled 2017-07-24 (×2): qty 3000

## 2017-07-24 MED ORDER — SODIUM CHLORIDE 0.9 % IV SOLN: Freq: Once | INTRAVENOUS | Status: DC

## 2017-07-24 NOTE — Discharge Instructions (Signed)
Blood Transfusion, Adult, Care After This sheet gives you information about how to care for yourself after your procedure. Your health care provider may also give you more specific instructions. If you have problems or questions, contact your health care provider. What can I expect after the procedure? After your procedure, it is common to have:  Bruising and soreness where the IV tube was inserted.  Headache.  Follow these instructions at home:  Take over-the-counter and prescription medicines only as told by your health care provider.  Return to your normal activities as told by your health care provider.  Follow instructions from your health care provider about how to take care of your IV insertion site. Make sure you: ? Wash your hands with soap and water before you change your bandage (dressing). If soap and water are not available, use hand sanitizer. ? Change your dressing as told by your health care provider.  Check your IV insertion site every day for signs of infection. Check for: ? More redness, swelling, or pain. ? More fluid or blood. ? Warmth. ? Pus or a bad smell. Contact a health care provider if:  You have more redness, swelling, or pain around the IV insertion site.  You have more fluid or blood coming from the IV insertion site.  Your IV insertion site feels warm to the touch.  You have pus or a bad smell coming from the IV insertion site.  Your urine turns pink, red, or brown.  You feel weak after doing your normal activities. Get help right away if:  You have signs of a serious allergic or immune system reaction, including: ? Itchiness. ? Hives. ? Trouble breathing. ? Anxiety. ? Chest or lower back pain. ? Fever, flushing, and chills. ? Rapid pulse. ? Rash. ? Diarrhea. ? Vomiting. ? Dark urine. ? Serious headache. ? Dizziness. ? Stiff neck. ? Yellow coloration of the face or the white parts of the eyes (jaundice). This information is not  intended to replace advice given to you by your health care provider. Make sure you discuss any questions you have with your health care provider. Document Released: 09/26/2014 Document Revised: 05/04/2016 Document Reviewed: 03/21/2016 Elsevier Interactive Patient Education  2018 [employer]. Platelet Count Test Why am I having this test? The platelet count test is performed to obtain a measure of how many platelets you have within a specific amount (volume) of blood. Platelets are specialized cells made in your bone marrow that gather at the site of tissue injury to stop bleeding. Most of the platelets in your body can be found in your bloodstream. Fewer platelets are stored in your liver and spleen. Your health care provider may want you to have this test if you have a rash or other medical condition that suggests that you have a low platelet count. This may include one of these conditions that cause excess bleeding or delayed blood clotting, such as:  Petechiae. These are small hemorrhages in the skin that cause a rash. The rash appears as a collection of red-purple pinprick-sized dots.  Heavy menstrual bleeding, if you are female.  Thrombocytopenia. This is a condition in which you have a low platelet count.  Bone marrow failure.  This test may also be used to monitor treatment for those conditions. What kind of sample is taken? A blood sample is required for this test. It is usually collected by inserting a needle into a vein or by sticking a finger with a small needle. How do  I prepare for this test? There is no preparation or fasting required for this test. What are the reference ranges? Reference ranges are considered healthy ranges established after testing a large group of healthy people. Reference ranges may vary among different people, labs, and hospitals. It is your responsibility to obtain your test results. Ask the lab or department performing the test when and how you will get  your results.  Adult or elderly: 150,000-400,000 per microliter.  Child: 150,000-400,000 per microliter.  Infant: 200,000-475,000 per microliter.  Premature infant: 100,000-300,000 per microliter.  Newborn: 150,000-300,000 per microliter.  What do the results mean? Results that are higher than the reference range can indicate a number of health conditions. These may include:  Certain types of cancer, such as leukemia or lymphoma.  Polycythemia vera. This is a condition in which the bone marrow is producing excess amounts of all cell types, including platelets.  Postsplenectomy syndrome. This is a condition that can occur after surgery that is done to remove the spleen (splenectomy).  Rheumatoid arthritis.  Iron-deficiency anemia.  Results that are lower than the reference range can also indicate a number of health conditions. These may include:  Hypersplenism. This is a condition in which the spleen is breaking down platelets faster than normal.  Bleeding (hemorrhage).  Immune thrombocytopenia.  Cancer or chemotherapy treatments for cancers such as leukemia.  Thrombotic thrombocytopenia.  HELLP syndrome.  Abnormal thyroid gland function, such as in Graves disease.  Certain inherited disorders that cause a decreased platelet count.  Disseminated intravascular coagulation (DIC). This is a condition in which abnormal blood-clotting processes occur.  Systemic lupus erythematosus (SLE).  Certain types of anemia, such as pernicious and hemolytic anemias.  Infection.  Talk with your health care provider to discuss your results, treatment options, and if necessary, the need for more tests. Talk with your health care provider if you have any questions about your results. This information is not intended to replace advice given to you by your health care provider. Make sure you discuss any questions you have with your health care provider. Document Released: 09/30/2004  Document Revised: 05/11/2016 Document Reviewed: 01/29/2014 Elsevier Interactive Patient Education  [employer].

## 2017-07-24 NOTE — Progress Notes (Signed)
Spoke with Colletta Maryland at [employer]; order received for pt to have CBC post platelet infusion; pt may leave after blood draw

## 2017-07-24 NOTE — Progress Notes (Signed)
CRITICAL VALUE ALERT  Critical Value:  Platelets 25  Date & Time Notied:  07/24/17 at 1240  Provider Notified: Dr. Marcello Moores Cornett's office  Orders Received/Actions taken:

## 2017-07-24 NOTE — Progress Notes (Signed)
Diagnosis: Pre-procedure transfusion of platelets  Physician: Dr. Erroll Luna  Patient received one unit of platelets. Patient tolerated well. Patient alert, oriented and ambulatory upon discharge.

## 2017-07-25 ENCOUNTER — Inpatient Hospital Stay: Admission: RE | Admit: 2017-07-25 | Payer: BC Managed Care – PPO | Source: Ambulatory Visit

## 2017-07-25 ENCOUNTER — Ambulatory Visit (HOSPITAL_COMMUNITY): Admission: RE | Admit: 2017-07-25 | Payer: BC Managed Care – PPO | Source: Ambulatory Visit | Admitting: Surgery

## 2017-07-25 ENCOUNTER — Encounter (HOSPITAL_COMMUNITY): Admission: RE | Payer: Self-pay | Source: Ambulatory Visit

## 2017-07-25 LAB — BPAM PLATELET PHERESIS
BLOOD PRODUCT EXPIRATION DATE: 201811072359
ISSUE DATE / TIME: 201811050954
Unit Type and Rh: 6200

## 2017-07-25 LAB — PREPARE PLATELET PHERESIS: UNIT DIVISION: 0

## 2017-07-25 SURGERY — BREAST LUMPECTOMY WITH RADIOACTIVE SEED LOCALIZATION
Anesthesia: General | Laterality: Right

## 2017-07-31 ENCOUNTER — Telehealth: Payer: Self-pay | Admitting: *Deleted

## 2017-07-31 NOTE — Telephone Encounter (Signed)
Received voice mail message from patient stating,"last week I got a platelet transfusion before my lumpectomy on Tuesday. However, my platelets only came up from 22 to 25. Dr. Cornett at [facility] cancelled the surgery because my platelets were not >50. I would like to have this surgery done this year because I have already met my deductible. Ask Dr. Shadad what is the plan to get my platelet count up? My return number is [phone]."  

## 2017-08-01 ENCOUNTER — Telehealth: Payer: Self-pay | Admitting: Oncology

## 2017-08-01 ENCOUNTER — Other Ambulatory Visit: Payer: Self-pay | Admitting: Oncology

## 2017-08-01 DIAGNOSIS — D696 Thrombocytopenia, unspecified: Secondary | ICD-10-CM

## 2017-08-01 NOTE — Telephone Encounter (Signed)
As noted below by Dr. Alen Blew, I left a message informing her that Dr. Alen Blew wants to start her on weekly injections to get the platelet count up. Nplate to start on 53/00 and weekly till her platelets are above 50. Instructed her to call [place] if she has any questions or concerns.

## 2017-08-01 NOTE — Telephone Encounter (Signed)
-----   Message from Wyatt Portela, MD sent at 08/01/2017  7:39 AM EST ----- Please let her know we will start injections weekly to get her platelets up.  Message to scheduling sent to start her on Nplate on 04/79 and weekly till her platelets are above 50

## 2017-08-01 NOTE — Telephone Encounter (Signed)
Left message for patient regarding appts that were added per 11/13 sch msg.

## 2017-08-04 ENCOUNTER — Other Ambulatory Visit (HOSPITAL_BASED_OUTPATIENT_CLINIC_OR_DEPARTMENT_OTHER): Payer: BC Managed Care – PPO

## 2017-08-04 ENCOUNTER — Ambulatory Visit (HOSPITAL_BASED_OUTPATIENT_CLINIC_OR_DEPARTMENT_OTHER): Payer: BC Managed Care – PPO

## 2017-08-04 VITALS — BP 149/82 | HR 81 | Temp 97.8°F | Resp 18

## 2017-08-04 DIAGNOSIS — D696 Thrombocytopenia, unspecified: Secondary | ICD-10-CM | POA: Diagnosis not present

## 2017-08-04 LAB — CBC WITH DIFFERENTIAL/PLATELET
BASO%: 0.2 % (ref 0.0–2.0)
Basophils Absolute: 0 10*3/uL (ref 0.0–0.1)
EOS ABS: 0.1 10*3/uL (ref 0.0–0.5)
EOS%: 1.5 % (ref 0.0–7.0)
HCT: 37.7 % (ref 34.8–46.6)
HGB: 12.6 g/dL (ref 11.6–15.9)
LYMPH%: 22.4 % (ref 14.0–49.7)
MCH: 30.1 pg (ref 25.1–34.0)
MCHC: 33.4 g/dL (ref 31.5–36.0)
MCV: 90.2 fL (ref 79.5–101.0)
MONO#: 0.4 10*3/uL (ref 0.1–0.9)
MONO%: 10.5 % (ref 0.0–14.0)
NEUT#: 2.6 10*3/uL (ref 1.5–6.5)
NEUT%: 65.4 % (ref 38.4–76.8)
PLATELETS: 30 10*3/uL — AB (ref 145–400)
RBC: 4.18 10*6/uL (ref 3.70–5.45)
RDW: 14.8 % — AB (ref 11.2–14.5)
WBC: 4 10*3/uL (ref 3.9–10.3)
lymph#: 0.9 10*3/uL (ref 0.9–3.3)

## 2017-08-04 MED ORDER — ROMIPLOSTIM 250 MCG ~~LOC~~ SOLR
160.0000 ug | Freq: Once | SUBCUTANEOUS | Status: AC
  Administered 2017-08-04: 160 ug via SUBCUTANEOUS
  Filled 2017-08-04: qty 0.32

## 2017-08-04 NOTE — Patient Instructions (Signed)

## 2017-08-11 ENCOUNTER — Encounter: Payer: Self-pay | Admitting: Oncology

## 2017-08-11 ENCOUNTER — Other Ambulatory Visit (HOSPITAL_BASED_OUTPATIENT_CLINIC_OR_DEPARTMENT_OTHER): Payer: BC Managed Care – PPO

## 2017-08-11 ENCOUNTER — Ambulatory Visit (HOSPITAL_BASED_OUTPATIENT_CLINIC_OR_DEPARTMENT_OTHER): Payer: BC Managed Care – PPO

## 2017-08-11 VITALS — BP 124/54 | HR 70 | Temp 97.8°F | Resp 18

## 2017-08-11 DIAGNOSIS — D696 Thrombocytopenia, unspecified: Secondary | ICD-10-CM

## 2017-08-11 LAB — CBC WITH DIFFERENTIAL/PLATELET
BASO%: 0.6 % (ref 0.0–2.0)
Basophils Absolute: 0 10*3/uL (ref 0.0–0.1)
EOS ABS: 0.1 10*3/uL (ref 0.0–0.5)
EOS%: 1.5 % (ref 0.0–7.0)
HCT: 38.7 % (ref 34.8–46.6)
HEMOGLOBIN: 13 g/dL (ref 11.6–15.9)
LYMPH%: 23.9 % (ref 14.0–49.7)
MCH: 30.6 pg (ref 25.1–34.0)
MCHC: 33.6 g/dL (ref 31.5–36.0)
MCV: 91.1 fL (ref 79.5–101.0)
MONO#: 0.4 10*3/uL (ref 0.1–0.9)
MONO%: 11.3 % (ref 0.0–14.0)
NEUT%: 62.7 % (ref 38.4–76.8)
NEUTROS ABS: 2.1 10*3/uL (ref 1.5–6.5)
PLATELETS: 33 10*3/uL — AB (ref 145–400)
RBC: 4.25 10*6/uL (ref 3.70–5.45)
RDW: 15 % — AB (ref 11.2–14.5)
WBC: 3.4 10*3/uL — AB (ref 3.9–10.3)
lymph#: 0.8 10*3/uL — ABNORMAL LOW (ref 0.9–3.3)

## 2017-08-11 MED ORDER — ROMIPLOSTIM INJECTION 500 MCG
325.0000 ug | Freq: Once | SUBCUTANEOUS | Status: AC
  Administered 2017-08-11: 325 ug via SUBCUTANEOUS
  Filled 2017-08-11: qty 0.65

## 2017-08-11 NOTE — Patient Instructions (Signed)

## 2017-08-11 NOTE — Progress Notes (Signed)
Events of the last few weeks noted and discussed with the patient today.  She is scheduled to have a lumpectomy for atypical ductal hyperplasia.  This was scheduled to be performed by Dr. Brantley Stage on November 6 but surgery was delayed because of thrombocytopenia with the goal of platelet count of 50,000.  She received 1 unit of platelet transfusion with improvement in her platelet counts that is marginal from 25 K to 30 K  She was started on Nplate on a weekly basis that started on 08/05/2017.  The plan is to receive it for 4 injections to get her platelet count close to 50,000.  She prefers to have the surgery done before the end of the year and potentially can be performed on the week of August 28, 2017 after platelets continue to respond.  Risks and benefits associated with this medication was discussed today with the patient.  Complications that include thrombosis especially in the setting of cirrhosis of the liver was discussed.  He is agreeable to proceed and attempt to get her platelet count to 50,000 to complete the surgery before the end of December 2018.

## 2017-08-18 ENCOUNTER — Ambulatory Visit (HOSPITAL_BASED_OUTPATIENT_CLINIC_OR_DEPARTMENT_OTHER): Payer: BC Managed Care – PPO

## 2017-08-18 ENCOUNTER — Other Ambulatory Visit (HOSPITAL_BASED_OUTPATIENT_CLINIC_OR_DEPARTMENT_OTHER): Payer: BC Managed Care – PPO

## 2017-08-18 VITALS — BP 140/87 | HR 93 | Temp 97.9°F | Resp 20

## 2017-08-18 DIAGNOSIS — D696 Thrombocytopenia, unspecified: Secondary | ICD-10-CM

## 2017-08-18 LAB — CBC WITH DIFFERENTIAL/PLATELET
BASO%: 0.3 % (ref 0.0–2.0)
Basophils Absolute: 0 10*3/uL (ref 0.0–0.1)
EOS ABS: 0.1 10*3/uL (ref 0.0–0.5)
EOS%: 1.8 % (ref 0.0–7.0)
HEMATOCRIT: 38.2 % (ref 34.8–46.6)
HEMOGLOBIN: 13 g/dL (ref 11.6–15.9)
LYMPH#: 0.8 10*3/uL — AB (ref 0.9–3.3)
LYMPH%: 20.1 % (ref 14.0–49.7)
MCH: 30.7 pg (ref 25.1–34.0)
MCHC: 34 g/dL (ref 31.5–36.0)
MCV: 90.3 fL (ref 79.5–101.0)
MONO#: 0.4 10*3/uL (ref 0.1–0.9)
MONO%: 10.4 % (ref 0.0–14.0)
NEUT%: 67.4 % (ref 38.4–76.8)
NEUTROS ABS: 2.7 10*3/uL (ref 1.5–6.5)
PLATELETS: 77 10*3/uL — AB (ref 145–400)
RBC: 4.23 10*6/uL (ref 3.70–5.45)
RDW: 15.2 % — AB (ref 11.2–14.5)
WBC: 3.9 10*3/uL (ref 3.9–10.3)

## 2017-08-18 MED ORDER — ROMIPLOSTIM INJECTION 500 MCG
325.0000 ug | Freq: Once | SUBCUTANEOUS | Status: AC
  Administered 2017-08-18: 325 ug via SUBCUTANEOUS
  Filled 2017-08-18: qty 0.65

## 2017-08-18 NOTE — Patient Instructions (Signed)

## 2017-08-25 ENCOUNTER — Other Ambulatory Visit (HOSPITAL_BASED_OUTPATIENT_CLINIC_OR_DEPARTMENT_OTHER): Payer: BC Managed Care – PPO

## 2017-08-25 ENCOUNTER — Other Ambulatory Visit: Payer: Self-pay | Admitting: Surgery

## 2017-08-25 ENCOUNTER — Ambulatory Visit (HOSPITAL_BASED_OUTPATIENT_CLINIC_OR_DEPARTMENT_OTHER): Payer: BC Managed Care – PPO

## 2017-08-25 ENCOUNTER — Ambulatory Visit: Payer: Self-pay | Admitting: Surgery

## 2017-08-25 VITALS — BP 142/82 | HR 88 | Temp 97.6°F | Resp 20

## 2017-08-25 DIAGNOSIS — D696 Thrombocytopenia, unspecified: Secondary | ICD-10-CM

## 2017-08-25 LAB — CBC WITH DIFFERENTIAL/PLATELET
BASO%: 0.3 % (ref 0.0–2.0)
BASOS ABS: 0 10*3/uL (ref 0.0–0.1)
EOS ABS: 0.1 10*3/uL (ref 0.0–0.5)
EOS%: 1.5 % (ref 0.0–7.0)
HCT: 37.3 % (ref 34.8–46.6)
HGB: 12.6 g/dL (ref 11.6–15.9)
LYMPH#: 0.7 10*3/uL — AB (ref 0.9–3.3)
LYMPH%: 21.2 % (ref 14.0–49.7)
MCH: 30.8 pg (ref 25.1–34.0)
MCHC: 33.8 g/dL (ref 31.5–36.0)
MCV: 91.2 fL (ref 79.5–101.0)
MONO#: 0.3 10*3/uL (ref 0.1–0.9)
MONO%: 9.8 % (ref 0.0–14.0)
NEUT#: 2.2 10*3/uL (ref 1.5–6.5)
NEUT%: 67.2 % (ref 38.4–76.8)
PLATELETS: 66 10*3/uL — AB (ref 145–400)
RBC: 4.09 10*6/uL (ref 3.70–5.45)
RDW: 14.8 % — ABNORMAL HIGH (ref 11.2–14.5)
WBC: 3.3 10*3/uL — ABNORMAL LOW (ref 3.9–10.3)

## 2017-08-25 MED ORDER — ROMIPLOSTIM INJECTION 500 MCG
325.0000 ug | Freq: Once | SUBCUTANEOUS | Status: AC
  Administered 2017-08-25: 325 ug via SUBCUTANEOUS
  Filled 2017-08-25: qty 0.65

## 2017-08-28 ENCOUNTER — Encounter (HOSPITAL_COMMUNITY): Payer: Self-pay

## 2017-08-28 ENCOUNTER — Encounter (HOSPITAL_COMMUNITY)
Admission: RE | Admit: 2017-08-28 | Discharge: 2017-08-28 | Disposition: A | Discharge status: Home / Self Care | Payer: BC Managed Care – PPO | Source: Ambulatory Visit | Attending: Surgery | Admitting: Surgery

## 2017-08-28 DIAGNOSIS — K7460 Unspecified cirrhosis of liver: Secondary | ICD-10-CM | POA: Diagnosis not present

## 2017-08-28 DIAGNOSIS — N6091 Unspecified benign mammary dysplasia of right breast: Secondary | ICD-10-CM | POA: Diagnosis not present

## 2017-08-28 DIAGNOSIS — I10 Essential (primary) hypertension: Secondary | ICD-10-CM | POA: Diagnosis not present

## 2017-08-28 DIAGNOSIS — D696 Thrombocytopenia, unspecified: Secondary | ICD-10-CM | POA: Diagnosis not present

## 2017-08-28 DIAGNOSIS — Z6841 Body Mass Index (BMI) 40.0 and over, adult: Secondary | ICD-10-CM | POA: Diagnosis not present

## 2017-08-28 DIAGNOSIS — I482 Chronic atrial fibrillation: Secondary | ICD-10-CM | POA: Diagnosis not present

## 2017-08-28 DIAGNOSIS — G4730 Sleep apnea, unspecified: Secondary | ICD-10-CM | POA: Diagnosis not present

## 2017-08-28 DIAGNOSIS — Z79899 Other long term (current) drug therapy: Secondary | ICD-10-CM | POA: Diagnosis not present

## 2017-08-28 DIAGNOSIS — Z87891 Personal history of nicotine dependence: Secondary | ICD-10-CM | POA: Diagnosis not present

## 2017-08-28 DIAGNOSIS — D241 Benign neoplasm of right breast: Secondary | ICD-10-CM | POA: Diagnosis present

## 2017-08-28 DIAGNOSIS — E119 Type 2 diabetes mellitus without complications: Secondary | ICD-10-CM | POA: Diagnosis not present

## 2017-08-28 LAB — BASIC METABOLIC PANEL
ANION GAP: 7 (ref 5–15)
BUN: 16 mg/dL (ref 6–20)
CHLORIDE: 107 mmol/L (ref 101–111)
CO2: 24 mmol/L (ref 22–32)
Calcium: 9 mg/dL (ref 8.9–10.3)
Creatinine, Ser: 0.93 mg/dL (ref 0.44–1.00)
GFR calc Af Amer: >60 mL/min (ref >60)
GLUCOSE: 135 mg/dL — AB (ref 65–99)
Potassium: 4 mmol/L (ref 3.5–5.1)
Sodium: 138 mmol/L (ref 135–145)

## 2017-08-28 LAB — CBC WITH DIFFERENTIAL/PLATELET
BASOS ABS: 0 10*3/uL (ref 0.0–0.1)
Basophils Relative: 0 %
Eosinophils Absolute: 0.1 10*3/uL (ref 0.0–0.7)
Eosinophils Relative: 2 %
HEMATOCRIT: 39.3 % (ref 36.0–46.0)
HEMOGLOBIN: 13.2 g/dL (ref 12.0–15.0)
LYMPHS ABS: 0.9 10*3/uL (ref 0.7–4.0)
LYMPHS PCT: 21 %
MCH: 30.2 pg (ref 26.0–34.0)
MCHC: 33.6 g/dL (ref 30.0–36.0)
MCV: 89.9 fL (ref 78.0–100.0)
Monocytes Absolute: 0.3 10*3/uL (ref 0.1–1.0)
Monocytes Relative: 8 %
NEUTROS ABS: 3 10*3/uL (ref 1.7–7.7)
Neutrophils Relative %: 69 %
Platelets: 70 10*3/uL — ABNORMAL LOW (ref 150–400)
RBC: 4.37 MIL/uL (ref 3.87–5.11)
RDW: 14.8 % (ref 11.5–15.5)
WBC: 4.3 10*3/uL (ref 4.0–10.5)

## 2017-08-28 LAB — HEMOGLOBIN A1C
Hgb A1c MFr Bld: 5.6 % (ref 4.8–5.6)
Mean Plasma Glucose: 114.02 mg/dL

## 2017-08-28 MED ORDER — GABAPENTIN 300 MG PO CAPS
300.0000 mg | ORAL_CAPSULE | ORAL | Status: AC
  Administered 2017-08-29: 300 mg via ORAL
  Filled 2017-08-28: qty 1

## 2017-08-28 NOTE — Progress Notes (Signed)
Anesthesia Chart Review:  Pt is a same day work up.   Pt is a 61 year old female scheduled for R breast lumpectomy with radioactive seed localization on 08/29/2017 with Erroll Luna, MD  Surgery was originally scheduled 07/25/17 but was cancelled due to low platelets.  Pt has chronically low platelets due to ITP, but pre-op platelet transfusion did not improved her count enough for surgery.  Since then, pt was started on Nplate weekly by Dr. Alen Blew.   - PCP is Dibas Dorthy Cooler, MD - GI is [name], MD - Cardiologist is Skeet Latch, MD. Pt cleared for surgery 06/27/17 by Cecilie Kicks, NP - Hematologist is Zola Button, MD.   PMH includes:  Persistent atrial fibrillation, HTN, DM, hyperlipidemia, liver cirrhosis due to NASH, portal hypertension, OSA, ITP, anemia.   Medications include: bumex, diltiazem, jardiance, potassium, propranolol, spironolactone.   Preoperative labs reviewed.   - HbA1c 5.6, glucose 135 - Platelets 70.  Minimum platelet count for surgery set at 50.     EKG 06/06/17: atrial fibrillation. RBBB.   Echo 12/10/15:  - Procedure narrative: Transthoracic echocardiography. The studywas technically difficult, as a result of body habitus. Intravenous contrast (Definity) was administered. - Left ventricle: The cavity size was normal. Wall thickness wasincreased in a pattern of mild LVH. Systolic function was normal.The estimated ejection fraction was in the range of 55% to 60%.Wall motion was normal; there were no regional wall motionabnormalities. The study is not technically sufficient to allowevaluation of LV diastolic function. - Mitral valve: Mildly thickened leaflets . There was trivial regurgitation. - Left atrium: The atrium was mildly dilated. - Inferior vena cava: The vessel was dilated. The respirophasicdiameter changes were blunted (<50%), consistent with elevatedcentral venous pressure. - Pericardium, extracardiac: A trivial pericardial  effusion was identified posterior to the heart. - Impressions: Technically difficult study. The patient was in atrial flutterduring the study. The LVEF is 55-60%, mild LVH, grossly normalwall motion, trivial MR, mild LAE, dilated IVC, trivial posteriorpericardial effusion.  If no changes, I anticipate pt can proceed with surgery as scheduled.   Willeen Cass, FNP-BC [facility] Short Stay Surgical Center/Anesthesiology Phone: [phone] 08/28/2017 3:40 PM

## 2017-08-28 NOTE — Pre-Procedure Instructions (Addendum)
Hannah Khan  08/28/2017      [id number] [address] [place] [id number]: [phone]: [phone]   Your procedure is scheduled on August 29, 2017.  [facility]  Call this number if you have problems the morning of surgery:  [phone]  Remember:  Do not eat food or drink liquids after midnight.  Take these medicines the morning of surgery with A SIP OF WATER propanolol ((inderal),  diltiazem (cardizem), methocarbamol (robaxin)-if needed for spasms.  Please finish drinking Ensure pre-surgery drink before leaving your house the morning of surgery  Beginning now, STOP taking any Aspirin (unless otherwise instructed by your surgeon), Aleve, Naproxen, Ibuprofen, Motrin, Advil, Goody's, BC's, all herbal medications, fish oil, and all vitamins  Continue all other medications as instructed by your physician except follow the above medication instructions before surgery  WHAT DO I DO ABOUT MY DIABETES MEDICATION?  .Marland KitchenDo not take oral diabetes medicines (pills) the morning of surgery (jardiance).  . The day of surgery, do not take other diabetes injectables, including Byetta (exenatide), Bydureon (exenatide ER), Victoza (liraglutide), or Trulicity (dulaglutide).    Do not wear jewelry, make-up or nail polish.  Do not wear lotions, powders, or perfumes, or deodorant.  Do not shave 48 hours prior to surgery.    Do not bring valuables to the hospital.  [facility] any belongings or [facility]uables.     How to Manage Your Diabetes Before and After Surgery  Why is it important to control my blood sugar before and after surgery? . Improving blood sugar levels before and after surgery helps healing and can limit problems. . A way of improving blood sugar control is eating a healthy diet by: o  Eating less sugar and carbohydrates o  Increasing  activity/exercise o  Talking with your doctor about reaching your blood sugar goals . High blood sugars (greater than 180 mg/dL) can raise your risk of infections and slow your recovery, so you will need to focus on controlling your diabetes during the weeks before surgery. . Make sure that the doctor who takes care of your diabetes knows about your planned surgery including the date and location.  How do I manage my blood sugar before surgery? . Check your blood sugar at least 4 times a day, starting 2 days before surgery, to make sure that the level is not too high or low. o Check your blood sugar the morning of your surgery when you wake up and every 2 hours until you get to the Short Stay unit. . If your blood sugar is less than 70 mg/dL, you will need to treat for low blood sugar: o Do not take insulin. o Treat a low blood sugar (less than 70 mg/dL) with  cup of clear juice (cranberry or apple), 4 glucose tablets, OR glucose gel. Recheck blood sugar in 15 minutes after treatment (to make sure it is greater than 70 mg/dL). If your blood sugar is not greater than 70 mg/dL on recheck, call [phone]  for further instructions. . Report your blood sugar to the short stay nurse when you get to Short Stay.  . If you are admitted to the hospital after surgery: o Your blood sugar will be checked by the staff and you will probably be given insulin after surgery (instead of oral diabetes medicines) to make sure you  have good blood sugar levels. o The goal for blood sugar control after surgery is 80-180 mg/dL.   Reviewed and Endorsed by [facility] Patient Education Committee, August 2015  Contacts, dentures or bridgework may not be worn into surgery.  Leave your suitcase in the car.  After surgery it may be brought to your room.  For patients admitted to the hospital, discharge time will be determined by your treatment team.  Patients discharged the day of surgery will not be allowed to  drive home.   Special instructions:   [place]- Preparing For Surgery  Before surgery, you can play an important role. Because skin is not sterile, your skin needs to be as free of germs as possible. You can reduce the number of germs on your skin by washing with CHG (chlorahexidine gluconate) Soap before surgery.  CHG is an antiseptic cleaner which kills germs and bonds with the skin to continue killing germs even after washing.  Please do not use if you have an allergy to CHG or antibacterial soaps. If your skin becomes reddened/irritated stop using the CHG.  Do not shave (including legs and underarms) for at least 48 hours prior to first CHG shower. It is OK to shave your face.  Please follow these instructions carefully.   1. Shower the NIGHT BEFORE SURGERY and the MORNING OF SURGERY with CHG.   2. If you chose to wash your hair, wash your hair first as usual with your normal shampoo.  3. After you shampoo, rinse your hair and body thoroughly to remove the shampoo.  4. Use CHG as you would any other liquid soap. You can apply CHG directly to the skin and wash gently with a scrungie or a clean washcloth.   5. Apply the CHG Soap to your body ONLY FROM THE NECK DOWN.  Do not use on open wounds or open sores. Avoid contact with your eyes, ears, mouth and genitals (private parts). Wash Face and genitals (private parts)  with your normal soap.  6. Wash thoroughly, paying special attention to the area where your surgery will be performed.  7. Thoroughly rinse your body with warm water from the neck down.  8. DO NOT shower/wash with your normal soap after using and rinsing off the CHG Soap.  9. Pat yourself dry with a CLEAN TOWEL.  10. Wear CLEAN PAJAMAS to bed the night before surgery, wear comfortable clothes the morning of surgery  11. Place CLEAN SHEETS on your bed the night of your first shower and DO NOT SLEEP WITH PETS.    Day of Surgery: Do not apply any  deodorants/lotions. Please wear clean clothes to the hospital/surgery center.     Please read over the following fact sheets that you were given.

## 2017-08-29 ENCOUNTER — Ambulatory Visit (HOSPITAL_COMMUNITY)
Admission: RE | Admit: 2017-08-29 | Discharge: 2017-08-29 | Disposition: A | Discharge status: Home / Self Care | Payer: BC Managed Care – PPO | Source: Ambulatory Visit | Attending: Surgery | Admitting: Surgery

## 2017-08-29 ENCOUNTER — Ambulatory Visit (HOSPITAL_COMMUNITY): Payer: BC Managed Care – PPO | Admitting: Emergency Medicine

## 2017-08-29 ENCOUNTER — Encounter (HOSPITAL_COMMUNITY)
Admission: RE | Disposition: A | Discharge status: Home / Self Care | Payer: Self-pay | Source: Ambulatory Visit | Attending: Surgery

## 2017-08-29 ENCOUNTER — Ambulatory Visit
Admission: RE | Admit: 2017-08-29 | Discharge: 2017-08-29 | Disposition: A | Discharge status: Home / Self Care | Payer: BC Managed Care – PPO | Source: Ambulatory Visit | Attending: Surgery | Admitting: Surgery

## 2017-08-29 ENCOUNTER — Encounter (HOSPITAL_COMMUNITY): Payer: Self-pay | Admitting: *Deleted

## 2017-08-29 DIAGNOSIS — N6091 Unspecified benign mammary dysplasia of right breast: Secondary | ICD-10-CM

## 2017-08-29 DIAGNOSIS — E119 Type 2 diabetes mellitus without complications: Secondary | ICD-10-CM | POA: Insufficient documentation

## 2017-08-29 DIAGNOSIS — K7460 Unspecified cirrhosis of liver: Secondary | ICD-10-CM | POA: Insufficient documentation

## 2017-08-29 DIAGNOSIS — I10 Essential (primary) hypertension: Secondary | ICD-10-CM | POA: Insufficient documentation

## 2017-08-29 DIAGNOSIS — Z87891 Personal history of nicotine dependence: Secondary | ICD-10-CM | POA: Insufficient documentation

## 2017-08-29 DIAGNOSIS — D696 Thrombocytopenia, unspecified: Secondary | ICD-10-CM | POA: Insufficient documentation

## 2017-08-29 DIAGNOSIS — D241 Benign neoplasm of right breast: Secondary | ICD-10-CM | POA: Insufficient documentation

## 2017-08-29 DIAGNOSIS — Z79899 Other long term (current) drug therapy: Secondary | ICD-10-CM | POA: Insufficient documentation

## 2017-08-29 DIAGNOSIS — G4730 Sleep apnea, unspecified: Secondary | ICD-10-CM | POA: Insufficient documentation

## 2017-08-29 DIAGNOSIS — I482 Chronic atrial fibrillation: Secondary | ICD-10-CM | POA: Insufficient documentation

## 2017-08-29 DIAGNOSIS — Z6841 Body Mass Index (BMI) 40.0 and over, adult: Secondary | ICD-10-CM | POA: Insufficient documentation

## 2017-08-29 HISTORY — PX: BREAST LUMPECTOMY WITH RADIOACTIVE SEED LOCALIZATION: SHX6424

## 2017-08-29 LAB — GLUCOSE, CAPILLARY
GLUCOSE-CAPILLARY: 110 mg/dL — AB (ref 65–99)
GLUCOSE-CAPILLARY: 86 mg/dL (ref 65–99)
Glucose-Capillary: 81 mg/dL (ref 65–99)

## 2017-08-29 SURGERY — BREAST LUMPECTOMY WITH RADIOACTIVE SEED LOCALIZATION
Anesthesia: General | Site: Breast | Laterality: Right

## 2017-08-29 MED ORDER — OXYCODONE HCL 5 MG PO TABS
5.0000 mg | ORAL_TABLET | Freq: Once | ORAL | Status: AC | PRN
  Administered 2017-08-29: 5 mg via ORAL

## 2017-08-29 MED ORDER — PROPOFOL 10 MG/ML IV BOLUS
INTRAVENOUS | Status: DC | PRN
  Administered 2017-08-29: 150 mg via INTRAVENOUS
  Administered 2017-08-29: 50 mg via INTRAVENOUS

## 2017-08-29 MED ORDER — FENTANYL CITRATE (PF) 100 MCG/2ML IJ SOLN: 25.0000 ug | INTRAMUSCULAR | Status: DC | PRN

## 2017-08-29 MED ORDER — CEFAZOLIN SODIUM-DEXTROSE 1-4 GM/50ML-% IV SOLN
INTRAVENOUS | Status: DC | PRN
  Administered 2017-08-29: 3 g via INTRAVENOUS

## 2017-08-29 MED ORDER — BUPIVACAINE-EPINEPHRINE 0.25% -1:200000 IJ SOLN
INTRAMUSCULAR | Status: DC | PRN
  Administered 2017-08-29: 30 mL

## 2017-08-29 MED ORDER — CHLORHEXIDINE GLUCONATE CLOTH 2 % EX PADS: 6.0000 | MEDICATED_PAD | Freq: Once | CUTANEOUS | Status: DC

## 2017-08-29 MED ORDER — OXYCODONE HCL 5 MG PO TABS
ORAL_TABLET | ORAL | Status: AC
  Filled 2017-08-29: qty 1

## 2017-08-29 MED ORDER — OXYCODONE HCL 5 MG/5ML PO SOLN: 5.0000 mg | Freq: Once | ORAL | Status: DC | PRN

## 2017-08-29 MED ORDER — FENTANYL CITRATE (PF) 250 MCG/5ML IJ SOLN
INTRAMUSCULAR | Status: AC
  Filled 2017-08-29: qty 5

## 2017-08-29 MED ORDER — LACTATED RINGERS IV SOLN
INTRAVENOUS | Status: DC
  Administered 2017-08-29: 13:00:00 via INTRAVENOUS

## 2017-08-29 MED ORDER — METOCLOPRAMIDE HCL 5 MG/ML IJ SOLN
INTRAMUSCULAR | Status: DC | PRN
  Administered 2017-08-29: 10 mg via INTRAVENOUS

## 2017-08-29 MED ORDER — OXYCODONE HCL 5 MG/5ML PO SOLN: 5.0000 mg | Freq: Once | ORAL | Status: AC | PRN

## 2017-08-29 MED ORDER — SUCCINYLCHOLINE CHLORIDE 200 MG/10ML IV SOSY
PREFILLED_SYRINGE | INTRAVENOUS | Status: DC | PRN
  Administered 2017-08-29: 140 mg via INTRAVENOUS

## 2017-08-29 MED ORDER — OXYCODONE HCL 5 MG PO TABS: 5.0000 mg | ORAL_TABLET | Freq: Four times a day (QID) | ORAL | 0 refills | Status: DC | PRN

## 2017-08-29 MED ORDER — BUPIVACAINE-EPINEPHRINE (PF) 0.25% -1:200000 IJ SOLN
INTRAMUSCULAR | Status: AC
  Filled 2017-08-29: qty 30

## 2017-08-29 MED ORDER — MIDAZOLAM HCL 2 MG/2ML IJ SOLN
INTRAMUSCULAR | Status: AC
  Filled 2017-08-29: qty 2

## 2017-08-29 MED ORDER — ONDANSETRON HCL 4 MG/2ML IJ SOLN: 4.0000 mg | Freq: Once | INTRAMUSCULAR | Status: DC | PRN

## 2017-08-29 MED ORDER — ONDANSETRON HCL 4 MG/2ML IJ SOLN
INTRAMUSCULAR | Status: DC | PRN
  Administered 2017-08-29: 4 mg via INTRAVENOUS

## 2017-08-29 MED ORDER — 0.9 % SODIUM CHLORIDE (POUR BTL) OPTIME
TOPICAL | Status: DC | PRN
  Administered 2017-08-29: 1000 mL

## 2017-08-29 MED ORDER — MIDAZOLAM HCL 5 MG/5ML IJ SOLN
INTRAMUSCULAR | Status: DC | PRN
  Administered 2017-08-29: 2 mg via INTRAVENOUS

## 2017-08-29 MED ORDER — FENTANYL CITRATE (PF) 250 MCG/5ML IJ SOLN
INTRAMUSCULAR | Status: DC | PRN
  Administered 2017-08-29 (×4): 25 ug via INTRAVENOUS
  Administered 2017-08-29: 50 ug via INTRAVENOUS
  Administered 2017-08-29 (×2): 25 ug via INTRAVENOUS

## 2017-08-29 MED ORDER — LIDOCAINE 2% (20 MG/ML) 5 ML SYRINGE
INTRAMUSCULAR | Status: DC | PRN
  Administered 2017-08-29: 60 mg via INTRAVENOUS

## 2017-08-29 MED ORDER — OXYCODONE HCL 5 MG PO TABS: 5.0000 mg | ORAL_TABLET | Freq: Once | ORAL | Status: DC | PRN

## 2017-08-29 SURGICAL SUPPLY — 48 items
ADH SKN CLS APL DERMABOND .7 (GAUZE/BANDAGES/DRESSINGS) ×1
APPLIER CLIP 9.375 MED OPEN (MISCELLANEOUS) ×3
APR CLP MED 9.3 20 MLT OPN (MISCELLANEOUS) ×1
BINDER BREAST 3XL (GAUZE/BANDAGES/DRESSINGS) ×2 IMPLANT
BLADE SURG 15 STRL LF DISP TIS (BLADE) ×1 IMPLANT
BLADE SURG 15 STRL SS (BLADE) ×3
CANISTER SUCT 3000ML PPV (MISCELLANEOUS) ×2 IMPLANT
CHLORAPREP W/TINT 26ML (MISCELLANEOUS) ×3 IMPLANT
CLIP APPLIE 9.375 MED OPEN (MISCELLANEOUS) IMPLANT
COVER LIGHT HANDLE STERIS (MISCELLANEOUS) ×3 IMPLANT
COVER PROBE W GEL 5X96 (DRAPES) ×3 IMPLANT
DERMABOND ADVANCED (GAUZE/BANDAGES/DRESSINGS) ×2
DERMABOND ADVANCED .7 DNX12 (GAUZE/BANDAGES/DRESSINGS) ×1 IMPLANT
DEVICE DUBIN SPECIMEN MAMMOGRA (MISCELLANEOUS) ×3 IMPLANT
DRAPE CHEST BREAST 15X10 FENES (DRAPES) ×3 IMPLANT
DRAPE UTILITY XL STRL (DRAPES) ×3 IMPLANT
ELECT CAUTERY BLADE 6.4 (BLADE) ×3 IMPLANT
ELECT REM PT RETURN 9FT ADLT (ELECTROSURGICAL) ×3
ELECTRODE REM PT RTRN 9FT ADLT (ELECTROSURGICAL) ×1 IMPLANT
GLOVE BIO SURGEON STRL SZ8 (GLOVE) ×3 IMPLANT
GLOVE BIOGEL PI IND STRL 8 (GLOVE) ×1 IMPLANT
GLOVE BIOGEL PI INDICATOR 8 (GLOVE) ×2
GOWN STRL REUS W/ TWL LRG LVL3 (GOWN DISPOSABLE) ×1 IMPLANT
GOWN STRL REUS W/ TWL XL LVL3 (GOWN DISPOSABLE) ×1 IMPLANT
GOWN STRL REUS W/TWL LRG LVL3 (GOWN DISPOSABLE) ×3
GOWN STRL REUS W/TWL XL LVL3 (GOWN DISPOSABLE) ×3
KIT BASIN OR (CUSTOM PROCEDURE TRAY) ×3 IMPLANT
KIT MARKER MARGIN INK (KITS) ×3 IMPLANT
LIGHT WAVEGUIDE WIDE FLAT (MISCELLANEOUS) ×3 IMPLANT
NDL HYPO 25GX1X1/2 BEV (NEEDLE) ×1 IMPLANT
NEEDLE HYPO 25GX1X1/2 BEV (NEEDLE) ×3 IMPLANT
NS IRRIG 1000ML POUR BTL (IV SOLUTION) ×2 IMPLANT
PACK SURGICAL SETUP 50X90 (CUSTOM PROCEDURE TRAY) ×3 IMPLANT
PENCIL BUTTON HOLSTER BLD 10FT (ELECTRODE) ×3 IMPLANT
SPONGE LAP 18X18 X RAY DECT (DISPOSABLE) ×3 IMPLANT
SUT MNCRL AB 4-0 PS2 18 (SUTURE) ×3 IMPLANT
SUT SILK 2 0 SH (SUTURE) ×2 IMPLANT
SUT VIC AB 2-0 SH 27 (SUTURE) ×3
SUT VIC AB 2-0 SH 27XBRD (SUTURE) ×1 IMPLANT
SUT VIC AB 3-0 SH 27 (SUTURE) ×3
SUT VIC AB 3-0 SH 27X BRD (SUTURE) ×1 IMPLANT
SYR BULB 3OZ (MISCELLANEOUS) ×3 IMPLANT
SYR CONTROL 10ML LL (SYRINGE) ×3 IMPLANT
TOWEL OR 17X24 6PK STRL BLUE (TOWEL DISPOSABLE) ×3 IMPLANT
TOWEL OR 17X26 10 PK STRL BLUE (TOWEL DISPOSABLE) ×3 IMPLANT
TUBE CONNECTING 12'X1/4 (SUCTIONS) ×1
TUBE CONNECTING 12X1/4 (SUCTIONS) ×1 IMPLANT
YANKAUER SUCT BULB TIP NO VENT (SUCTIONS) ×2 IMPLANT

## 2017-08-29 NOTE — Op Note (Signed)
Preoperative diagnosis: Right breast atypical ductal hyperplasia  Postoperative diagnosis: Same  Procedure: Right breast seed localized lumpectomy x2  Surgeon: Erroll Luna MD  Anesthesia: LMA with local  EBL: 20 cc  Specimen: Right breast tissue with 2 seeds and clip verified by x-ray in the operating room  Drains: None  IV fluids: Per anesthesia record.  Indications for procedure: The patient is a 62 year old female found to have a mammographic abnormality on a recent screening mammogram a biopsy was done which showed atypical ductal hyperplasia.  There are 2 other areas adjacent to the area of ADH that also require excision.  She presents today for a bracketed seed localized lumpectomy of all 3 areas.  I discussed the case the radiologist as well and reviewed the films preoperatively.The procedure has been discussed with the patient. Alternatives to surgery have been discussed with the patient.  Risks of surgery include bleeding,  Infection,  Seroma formation, death,  and the need for further surgery.   The patient understands and wishes to proceed. Pt has OSA and this was discussed with her and her husband in the holding area.  Discussed risk of pulmonary complications at discharge especially with narcotic use.  Discussed admission and discharge and reviewed the Up to Date criteria for discharge of OSA patients.  She is aware of these risks but wishes to go home after surgery.  Discussed limiting narcotic use in this circumstance.  She voiced her understanding.  Description of procedure: The patient was met in the holding area.  Her questions were answered.  She was taken back to the operating room and placed supine on the OR table.  After induction of general anesthesia the right breast was prepped and draped in sterile fashion and timeout was done.  Of note she has chronic thrombocytopenia and her preoperative blood count was 70.  After timeout was done a transverse incision was made in  the breast.  Neoprobe was used to identify both seeds.  All tissue in between both seeds including the both seeds were excised in their entirety with grossly negative margins.  Radiograph revealed the seed seeds to be present as well as the clip.  Hemostasis was achieved with cautery.  The specimen was oriented and sent to pathology.  After ensuring hemostasis the wound was closed with 3-0 Vicryl and 4-0 Monocryl.  Dermabond was applied.  All final counts were found to be correct sponge, needle and instruments.  The patient was awoke extubated taken to recovery in satisfactory condition.

## 2017-08-29 NOTE — Interval H&P Note (Signed)
History and Physical Interval Note:  08/29/2017 3:34 PM  Hannah Khan  has presented today for surgery, with the diagnosis of ATYPICAL DUCTAL HYPERPLASIA  The various methods of treatment have been discussed with the patient and family. After consideration of risks, benefits and other options for treatment, the patient has consented to  Procedure(s): RIGHT BREAST LUMPECTOMY WITH RADIOACTIVE SEED LOCALIZATION (Right) as a surgical intervention .  [name] history has been reviewed, patient examined, no change in status, stable for surgery.  I have reviewed the patient's chart and labs.  Questions were answered to the patient's satisfaction.     [place]

## 2017-08-29 NOTE — Anesthesia Postprocedure Evaluation (Signed)
Anesthesia Post Note  Patient: Hannah Khan  Procedure(s) Performed: RIGHT BREAST LUMPECTOMY WITH RADIOACTIVE SEEDS LOCALIZATION (Right Breast)     Patient location during evaluation: PACU Anesthesia Type: General Level of consciousness: awake, awake and alert and oriented Pain management: pain level controlled Vital Signs Assessment: post-procedure vital signs reviewed and stable Respiratory status: spontaneous breathing, nonlabored ventilation and respiratory function stable Cardiovascular status: blood pressure returned to baseline Anesthetic complications: no    Last Vitals:  Vitals:   08/29/17 1730 08/29/17 1733  BP: 126/74 (!) 150/64  Pulse: 97 100  Resp: 18 20  Temp:    SpO2: 95% 92%    Last Pain:  Vitals:   08/29/17 1733  TempSrc:   PainSc: 0-No pain                 Janice Bodine COKER

## 2017-08-29 NOTE — Transfer of Care (Signed)
Immediate Anesthesia Transfer of Care Note  Patient: Hannah Khan  Procedure(s) Performed: RIGHT BREAST LUMPECTOMY WITH RADIOACTIVE SEEDS LOCALIZATION (Right Breast)  Patient Location: PACU  Anesthesia Type:General  Level of Consciousness: drowsy  Airway & Oxygen Therapy: Patient Spontanous Breathing and Patient connected to face mask oxygen  Post-op Assessment: Report given to RN, Post -op Vital signs reviewed and stable and Patient moving all extremities  Post vital signs: Reviewed and stable  Last Vitals:  Vitals:   08/29/17 1211 08/29/17 1700  BP: (!) 145/78   Pulse: 88   Resp: 20   Temp: 36.6 C (!) 36.3 C  SpO2: 97%     Last Pain:  Vitals:   08/29/17 1211  TempSrc: Oral         Complications: No apparent anesthesia complications

## 2017-08-29 NOTE — H&P (Signed)
Hannah Khan  Location: [facility] Patient #: [id number] DOB: 1956-06-27 Married / Language: English / Race: White Female  History of Present Illness Patient words: Patient sent other request of Dr. Dorise Bullion for abnormal mammogram. Patient was noted to have an area in her right breast with 4 small masses were found to be atypical. Should require biopsy of the largest which showed atypical ductal hyperplasia with in a papilloma. Either masses were palpable within 1-2 cm of the initial mass. She also calcified mass appeared to be a fibroadenoma. She has multiple medical problems including cirrhosis and thrombocytopenia which is chronic. Her liver function is remaining stable. Patient denies any history of breast pain and discharge or mass.      CLINICAL DATA: The patient was called back from screening mammography due to right breast masses.  EXAM: DIGITAL DIAGNOSTIC RIGHT MAMMOGRAM WITH CAD  ULTRASOUND RIGHT BREAST  COMPARISON: Previous exam(s).  ACR Breast Density Category b: There are scattered areas of fibroglandular density.  FINDINGS: There are 4 new right breast masses in the medial right breast. The most anterior of these masses measures 7 mm. The largest of these masses measures 12 mm. Two tiny masses are seen most posteriorly with the larger of these 2 masses measuring 4 mm. There is also a partially calcified mass in this region which is almost certainly a benign calcifying fibroadenoma.  Mammographic images were processed with CAD.  On physical exam, no suspicious lumps are identified.  Targeted ultrasound is performed, showing a mass in the right breast 1 o'clock, 3 cm from the nipple measuring 6 x 5 x 6 mm with internal blood flow. There is another mass at 1:30, 8 cm from the nipple measuring 3 x 2 x 3 mm. There is an adjacent tiny mass at 1:30, 8 cm from the nipple measures 2 x 2 x 2 mm. The largest mass seen at 1:30, 6  cm from the nipple measuring 9 x 7 x 11 mm. The 2 largest masses demonstrate internal blood flow. The calcifying right breast mass, likely a calcifying fibroadenoma is also identified. No axillary adenopathy.  IMPRESSION: 4 new masses in the medial right breast.  RECOMMENDATION: Recommend ultrasound-guided biopsy of the 2 largest new right breast masses at 1 o'clock, 3 cm from the nipple and at 1:30, 6 cm from the nipple. If these biopsies result in the need for surgery, consider biopsying 1 of the more posterior tiny masses as all 4 will likely need to be removed.  I have discussed the findings and recommendations with the patient. Results were also provided in writing at the conclusion of the visit. If applicable, a reminder letter will be sent to the patient regarding the next appointment.  BI-RADS CATEGORY 4: Suspicious.   Electronically Signed By: Dorise Bullion III M.D On: 06/13/2017 17:50                      Diagnosis Breast, right, needle core biopsy, 1:30 o'clock - ATYPICAL DUCTAL HYPERPLASIA INVOLVING AN INTRADUCTAL PAPILLOMA. Microscopic Comment Dr. Lyndon Code has reviewed the case. The case was called to [facility] on 06/20/2017. Vicente Males MD Pathologist, Electronic Signature (Case signed 06/20/2017) Specimen Gross and Clinical Information Specimen Comment In formalin 3:50 extracted less than 1 min; mass Specimen(s) Obtained: Breast, right, needle core biopsy, 1:30 o'clock Specimen Clinical.  The patient is a 61 year old female.   Allergies (Tanisha A. Owens Shark, RMA; 06/23/2017 10:30 AM) CeleBREX *ANALGESICS -  ANTI-INFLAMMATORY* Statins PredniSONE *CORTICOSTEROIDS* Allergies Reconciled  Medication History) Furosemide (40MG Tablet, Oral) Active. Propranolol HCl (10MG Tablet, Oral) Active. Spironolactone (25MG Tablet, Oral) Active. Methocarbamol (500MG Tablet, Oral)  Active. CoQ10 (100MG Capsule, Oral) Active. Vitamin B12 (100MCG Tablet, Oral) Active. Vitamin D (1000UNIT Tablet, Oral) Active. Medications Reconciled    Vitals  06/23/2017 10:29 AM Weight: 330.2 lb Height: 67.5in Body Surface Area: 2.52 m Body Mass Index: 50.95 kg/m  Temp.: 98.86F  Pulse: 103 (Regular)  BP: 118/68 (Sitting, Left Arm, Standard)      Physical Exam   General Mental Status-Alert. General Appearance-Consistent with stated age. Hydration-Well hydrated. Voice-Normal.  Head and Neck Head-normocephalic, atraumatic with no lesions or palpable masses. Trachea-midline. Thyroid Gland Characteristics - normal size and consistency.  Chest and Lung Exam Chest and lung exam reveals -quiet, even and easy respiratory effort with no use of accessory muscles and on auscultation, normal breath sounds, no adventitious sounds and normal vocal resonance. Inspection Chest Wall - Normal. Back - normal.  Breast Note: Bruising right medial breast. Masses NONE. Left breast is normal.  Cardiovascular Cardiovascular examination reveals -normal heart sounds, regular rate and rhythm with no murmurs and normal pedal pulses bilaterally.  Abdomen Note: Obese  Neurologic Neurologic evaluation reveals -alert and oriented x 3 with no impairment of recent or remote memory. Mental Status-Normal.  Lymphatic Head & Neck  General Head & Neck Lymphatics: Bilateral - Description - Normal. Axillary  General Axillary Region: Bilateral - Description - Normal. Tenderness - Non Tender.    Assessment & Plan   ATYPICAL DUCTAL HYPERPLASIA OF RIGHT BREAST (N60.91) Impression: Discussed her breast lumpectomy. There is risk wITH lumpectomy therefore recommend right breast lumpectomy. This area can be bracketed to remove the other areas as well. She has an increased operative risk due to her thrombocytopenia, cirrhosis, sleep apnea,  morbid obesity and other comorbidities. We'll obtain anesthesia clearance. We will obtain cardiac clearance. Discussed increased risk of bleeding and observation as an alternative. She voiced her understanding but would like to proceed with lumpectomy. Risk of lumpectomy include bleeding, infection, seroma, more surgery, use of seed/wire, wound care, cosmetic deformity and the need for other treatments, death , blood clots, death. Pt agrees to proceed.  Current Plans Pt Education - [facility] Breast Biopsy HCI: discussed with patient and provided information. Pt Education - [facility]b breast cancer surgery: discussed with patient and provided information. PORTAL HYPERTENSION (K76.6)  CIRRHOSIS (K74.60)  THROMBOCYTOPENIA (D69.6)  CHRONIC A-FIB (I48.2)

## 2017-08-29 NOTE — Anesthesia Procedure Notes (Signed)
Procedure Name: Intubation Date/Time: 08/29/2017 3:43 PM Performed by: Wilburn Cornelia, CRNA Pre-anesthesia Checklist: Patient identified, Emergency Drugs available, Suction available, Patient being monitored and Timeout performed Patient Re-evaluated:Patient Re-evaluated prior to induction Oxygen Delivery Method: Circle system utilized Preoxygenation: Pre-oxygenation with 100% oxygen Induction Type: IV induction Ventilation: Mask ventilation without difficulty LMA: LMA inserted LMA Size: 4.0 and 5.0 Grade View: Grade I Tube type: Oral Tube size: 7.0 mm Number of attempts: 1 Airway Equipment and Method: Stylet Placement Confirmation: ETT inserted through vocal cords under direct vision,  positive ETCO2,  CO2 detector and breath sounds checked- equal and bilateral Secured at: 22 cm Tube secured with: Tape Dental Injury: Teeth and Oropharynx as per pre-operative assessment  Comments: LMA 4 inserted with ease, however leaking with assisted ventilation.  LMA 5 placed with ease - Leaking still occurring with inadequate tidal volumes.  Converted to [place] with ETT.

## 2017-08-29 NOTE — Anesthesia Preprocedure Evaluation (Signed)
Anesthesia Evaluation  Patient identified by MRN, date of birth, ID band Patient awake    Reviewed: Allergy & Precautions, NPO status , Patient's Chart, lab work & pertinent test results  Airway Mallampati: III  TM Distance: >3 FB Neck ROM: Full    Dental  (+) Teeth Intact, Dental Advisory Given   Pulmonary former smoker,    breath sounds clear to auscultation       Cardiovascular hypertension,  Rhythm:Irregular Rate:Normal     Neuro/Psych    GI/Hepatic   Endo/Other  diabetes  Renal/GU      Musculoskeletal   Abdominal (+) + obese,   Peds  Hematology   Anesthesia Other Findings   Reproductive/Obstetrics                             Anesthesia Physical Anesthesia Plan  ASA: III  Anesthesia Plan:    Post-op Pain Management:    Induction: Intravenous  PONV Risk Score and Plan: 1 and Ondansetron, Midazolam and Metaclopromide  Airway Management Planned: LMA  Additional Equipment:   Intra-op Plan:   Post-operative Plan:   Informed Consent: I have reviewed the patients History and Physical, chart, labs and discussed the procedure including the risks, benefits and alternatives for the proposed anesthesia with the patient or authorized representative who has indicated his/her understanding and acceptance.   Dental advisory given  Plan Discussed with: CRNA and Anesthesiologist  Anesthesia Plan Comments:         Anesthesia Quick Evaluation

## 2017-08-29 NOTE — Discharge Instructions (Signed)
[facility] °Office Phone Number [phone] ° °BREAST BIOPSY/ PARTIAL MASTECTOMY: POST OP INSTRUCTIONS ° °Always review your discharge instruction sheet given to you by the facility where your surgery was performed. ° °IF YOU HAVE DISABILITY OR FAMILY LEAVE FORMS, YOU MUST BRING THEM TO THE OFFICE FOR PROCESSING.  DO NOT GIVE THEM TO YOUR DOCTOR. ° °1. A prescription for pain medication may be given to you upon discharge.  Take your pain medication as prescribed, if needed.  If narcotic pain medicine is not needed, then you may take acetaminophen (Tylenol) or ibuprofen (Advil) as needed. °2. Take your usually prescribed medications unless otherwise directed °3. If you need a refill on your pain medication, please contact your pharmacy.  They will contact our office to request authorization.  Prescriptions will not be filled after 5pm or on week-ends. °4. You should eat very light the first 24 hours after surgery, such as soup, crackers, pudding, etc.  Resume your normal diet the day after surgery. °5. Most patients will experience some swelling and bruising in the breast.  Ice packs and a good support bra will help.  Swelling and bruising can take several days to resolve.  °6. It is common to experience some constipation if taking pain medication after surgery.  Increasing fluid intake and taking a stool softener will usually help or prevent this problem from occurring.  A mild laxative (Milk of Magnesia or Miralax) should be taken according to package directions if there are no bowel movements after 48 hours. °7. Unless discharge instructions indicate otherwise, you may remove your bandages 24-48 hours after surgery, and you may shower at that time.  You may have steri-strips (small skin tapes) in place directly over the incision.  These strips should be left on the skin for 7-10 days.  If your surgeon used skin glue on the incision, you may shower in 24 hours.  The glue will flake off over the  next 2-3 weeks.  Any sutures or staples will be removed at the office during your follow-up visit. °8. ACTIVITIES:  You may resume regular daily activities (gradually increasing) beginning the next day.  Wearing a good support bra or sports bra minimizes pain and swelling.  You may have sexual intercourse when it is comfortable. °a. You may drive when you no longer are taking prescription pain medication, you can comfortably wear a seatbelt, and you can safely maneuver your car and apply brakes. °b. RETURN TO WORK:  ______________________________________________________________________________________ °9. You should see your doctor in the office for a follow-up appointment approximately two weeks after your surgery.  Your doctor’s nurse will typically make your follow-up appointment when she calls you with your pathology report.  Expect your pathology report 2-3 business days after your surgery.  You may call to check if you do not hear from us after three days. °10. OTHER INSTRUCTIONS: _______________________________________________________________________________________________ _____________________________________________________________________________________________________________________________________ °_____________________________________________________________________________________________________________________________________ °_____________________________________________________________________________________________________________________________________ ° °WHEN TO CALL YOUR DOCTOR: °1. Fever over 101.0 °2. Nausea and/or vomiting. °3. Extreme swelling or bruising. °4. Continued bleeding from incision. °5. Increased pain, redness, or drainage from the incision. ° °The clinic staff is available to answer your questions during regular business hours.  Please don’t hesitate to call and ask to speak to one of the nurses for clinical concerns.  If you have a medical emergency, go to the nearest  emergency room or call 911.  A surgeon from [facility] is always on call at the hospital. ° °For further questions, please visit [email or web address]  °

## 2017-08-30 ENCOUNTER — Encounter (HOSPITAL_COMMUNITY): Payer: Self-pay | Admitting: Surgery

## 2017-09-28 ENCOUNTER — Emergency Department (HOSPITAL_COMMUNITY): Payer: No Typology Code available for payment source

## 2017-09-28 ENCOUNTER — Other Ambulatory Visit: Payer: Self-pay

## 2017-09-28 ENCOUNTER — Emergency Department (HOSPITAL_COMMUNITY)
Admission: EM | Admit: 2017-09-28 | Discharge: 2017-09-28 | Disposition: A | Discharge status: Home / Self Care | Payer: No Typology Code available for payment source | Attending: Emergency Medicine | Admitting: Emergency Medicine

## 2017-09-28 ENCOUNTER — Encounter (HOSPITAL_COMMUNITY): Payer: Self-pay

## 2017-09-28 DIAGNOSIS — I5032 Chronic diastolic (congestive) heart failure: Secondary | ICD-10-CM | POA: Diagnosis not present

## 2017-09-28 DIAGNOSIS — Z87891 Personal history of nicotine dependence: Secondary | ICD-10-CM | POA: Diagnosis not present

## 2017-09-28 DIAGNOSIS — Z79899 Other long term (current) drug therapy: Secondary | ICD-10-CM | POA: Insufficient documentation

## 2017-09-28 DIAGNOSIS — I110 Hypertensive heart disease with heart failure: Secondary | ICD-10-CM | POA: Diagnosis not present

## 2017-09-28 DIAGNOSIS — Y92481 Parking lot as the place of occurrence of the external cause: Secondary | ICD-10-CM | POA: Insufficient documentation

## 2017-09-28 DIAGNOSIS — W101XXA Fall (on)(from) sidewalk curb, initial encounter: Secondary | ICD-10-CM | POA: Insufficient documentation

## 2017-09-28 DIAGNOSIS — M25611 Stiffness of right shoulder, not elsewhere classified: Secondary | ICD-10-CM | POA: Insufficient documentation

## 2017-09-28 DIAGNOSIS — Y9301 Activity, walking, marching and hiking: Secondary | ICD-10-CM | POA: Diagnosis not present

## 2017-09-28 DIAGNOSIS — S42294A Other nondisplaced fracture of upper end of right humerus, initial encounter for closed fracture: Secondary | ICD-10-CM | POA: Insufficient documentation

## 2017-09-28 DIAGNOSIS — E119 Type 2 diabetes mellitus without complications: Secondary | ICD-10-CM | POA: Insufficient documentation

## 2017-09-28 DIAGNOSIS — Y998 Other external cause status: Secondary | ICD-10-CM | POA: Insufficient documentation

## 2017-09-28 DIAGNOSIS — S4991XA Unspecified injury of right shoulder and upper arm, initial encounter: Secondary | ICD-10-CM | POA: Diagnosis present

## 2017-09-28 MED ORDER — OXYCODONE HCL 5 MG PO TABS
5.0000 mg | ORAL_TABLET | Freq: Once | ORAL | Status: AC
  Administered 2017-09-28: 5 mg via ORAL
  Filled 2017-09-28: qty 1

## 2017-09-28 MED ORDER — OXYCODONE HCL 5 MG PO TABS: 5.0000 mg | ORAL_TABLET | Freq: Four times a day (QID) | ORAL | 0 refills | Status: DC | PRN

## 2017-09-28 MED ORDER — HYDROMORPHONE HCL 1 MG/ML IJ SOLN
0.5000 mg | Freq: Once | INTRAMUSCULAR | Status: AC
  Administered 2017-09-28: 0.5 mg via INTRAMUSCULAR
  Filled 2017-09-28: qty 1

## 2017-09-28 NOTE — ED Notes (Signed)
Orthopedic specialist at bedside placing sling.

## 2017-09-28 NOTE — ED Notes (Signed)
ED Provider at bedside. 

## 2017-09-28 NOTE — ED Triage Notes (Signed)
Pt reports that she was pushing her rolling suitcase and it got stuck and caused her to fall. She braced herself with her R shoulder and states that it twisted and now is popping. Abrasion also noted to L knee. Hx of afib and hypotension. A&Ox4.

## 2017-09-28 NOTE — ED Provider Notes (Signed)
[facility] DEPT Provider Note   CSN: [id number] Arrival date & time: 09/28/17  1342     History   Chief Complaint Chief Complaint  Patient presents with  . Shoulder Pain    R    HPI Hannah Khan is a 62 y.o. female.  Patient with a mechanical fall when stepping off the curb when her rolling cart became caught on an obstruction in the parking lot. She fell face first, attempting to stop her fall with an outstretched hand. She denies loss of consciousness. She is reporting pain to the right upper arm/shoulder.   The history is provided by the patient. No language interpreter was used.  Shoulder Pain   This is a new problem. The current episode started 3 to 5 hours ago. The problem has been gradually worsening. The pain is present in the right shoulder. The quality of the pain is described as aching, pounding and constant. The pain is severe. Associated symptoms include limited range of motion. There has been a history of trauma.    Past Medical History:  Diagnosis Date  . Anemia    hx of  . Arthritis    knees  . Atrial fibrillation ([place])   . Blood dyscrasia   . Diabetes mellitus without complication ([place])    type 2  . Dysrhythmia    a fib  . Fibromyalgia   . HTN (hypertension)   . Hx of colonic polyps    s/p partial colectomy  . Hyperlipidemia   . ITP (idiopathic thrombocytopenic purpura)   . Joint pain    in knees and back spasms  . Left leg swelling    wear compression hose  . Liver cirrhosis secondary to NASH (nonalcoholic steatohepatitis) ([place]) dx nov 2014   had enlarged spleen also   . Low blood pressure    bp varies  . Morbid obesity ([place])   . Pneumonia feb 2018 weel now  . PONV (postoperative nausea and vomiting)    in past none recent  . Portal hypertension ([place])   . Shortness of breath dyspnea    history of with fluid overload takes diuretics improved now, with exertion  . Sleep apnea    uses cpap setting of 14  .  Sore on scalp    may bleed secondary to ITP comes and goes none now  . Spleen enlarged     Patient Active Problem List   Diagnosis Date Noted  . Acute hypoxemic respiratory failure ([place]) 11/01/2016  . Community acquired pneumonia of right lower lobe of lung ([place]) 10/31/2016  . Muscular abdominal pain in right upper quadrant 07/11/2016  . Chronic diastolic CHF (congestive heart failure) ([place]) 07/10/2016  . Thrombocytopenia ([place]) 07/10/2016  . Permanent atrial fibrillation ([place]) 01/30/2016  . Type 2 diabetes mellitus without complication, without long-term current use of insulin ([place]) 01/30/2016  . Acute on chronic diastolic CHF (congestive heart failure), NYHA class 2 ([place]) 01/26/2016  . Morbid obesity- BMI 56 12/10/2015  . Melena 01/31/2015  . Aortic heart murmur 01/31/2015  . Cirrhosis of liver, with portal HTN & Acites, by CT Abdomen 07/20/13 07/23/2013  . Pancytopenia ([place]) 07/23/2013  . Obstructive sleep apnea 08/07/2007  . Essential hypertension 08/07/2007  . ALLERGIC RHINITIS 08/07/2007    Past Surgical History:  Procedure Laterality Date  . BREAST LUMPECTOMY WITH RADIOACTIVE SEED LOCALIZATION Right 08/29/2017   Procedure: RIGHT BREAST LUMPECTOMY WITH RADIOACTIVE SEEDS LOCALIZATION;  Surgeon: Erroll Luna, MD;  Location: [facility];  Service:  General;  Laterality: Right;  . CESAREAN SECTION  1989  . CHOLECYSTECTOMY  20 yrs ago  . COLECTOMY  2011  . COLONOSCOPY    . DILATATION & CURETTAGE/HYSTEROSCOPY WITH MYOSURE N/A 05/06/2016   Procedure: DILATATION & CURETTAGE/HYSTEROSCOPY WITH MYOSURE WITH POLYPECTOMY;  Surgeon: Janyth Pupa, DO;  Location: [facility];  Service: Gynecology;  Laterality: N/A;  . ESOPHAGOGASTRODUODENOSCOPY (EGD) WITH PROPOFOL N/A 09/04/2013   Procedure: ESOPHAGOGASTRODUODENOSCOPY (EGD) WITH PROPOFOL;  Surgeon: [name], MD;  Location: [facility];  Service: Endoscopy;  Laterality: N/A;  . KNEE ARTHROSCOPY  yrs ago   bilateral, one done x 1, one done  twice    OB History    No data available       Home Medications    Prior to Admission medications   Medication Sig Start Date End Date Taking? Authorizing Provider  bumetanide (BUMEX) 2 MG tablet Take 2-4 mg by mouth See admin instructions. Take 4 mgs by mouth once daily, may take an additional 53m as needed for swelling    [provider]  Capsaicin-Menthol (SALONPAS GEL EX) Apply 1 application topically as needed (for pain).    [provider]  diltiazem (CARDIZEM CD) 240 MG 24 hr capsule Take 1 capsule (240 mg total) by mouth daily. 07/11/17   RSkeet Latch MD  JARDIANCE 25 MG TABS tablet Take 25 mg by mouth daily. 01/28/17   [provider]  KLOR-CON M20 20 MEQ tablet Take 20 mEq by mouth daily.  03/23/17   [provider]  methocarbamol (ROBAXIN) 500 MG tablet Take 500-750 mg by mouth daily as needed for muscle spasms.     [provider]  oxyCODONE (OXY IR/ROXICODONE) 5 MG immediate release tablet Take 1-2 tablets (5-10 mg total) by mouth every 6 (six) hours as needed for severe pain. 08/29/17   Cornett, TMarcello Moores MD  propranolol (INDERAL) 10 MG tablet Take 1 tablet (10 mg total) by mouth 2 (two) times daily. 02/06/17   RSkeet Latch MD  spironolactone (ALDACTONE) 25 MG tablet Take 25 mg by mouth daily.  07/24/13   SThurnell Lose MD    Family History Family History  Problem Relation Age of Onset  . Heart failure Mother   . Cancer Mother   . Hyperlipidemia Mother   . Congestive Heart Failure Mother   . Stroke Mother   . Lung cancer Father   . Cancer Father   . Cancer Brother   . Hyperlipidemia Brother   . Stroke Other     Social History Social History   Tobacco Use  . Smoking status: Former Smoker    Packs/day: 1.00    Years: 3.00    Pack years: 3.00    Types: Cigarettes    Last attempt to quit: 09/19/1974    Years since quitting: 43.0  . Smokeless tobacco: Never Used  Substance Use Topics  . Alcohol use: Yes      Alcohol/week: 0.6 oz    Types: 1 Standard drinks or equivalent per week    Comment: few times year  . Drug use: No     Allergies   Celecoxib; Shellfish allergy; Barium-containing compounds; Prednisone; Statins; Ibuprofen; and Tylenol [acetaminophen]   Review of Systems Review of Systems  Musculoskeletal: Positive for arthralgias. Negative for back pain and neck pain.  All other systems reviewed and are negative.    Physical Exam Updated Vital Signs BP (!) 110/59 (BP Location: Left Arm)   Pulse 82   Temp 98.4 F (36.9 C) (  Oral)   Resp 18   Ht 5' 7"  (1.702 m)   Wt (!) 158.8 kg (350 lb)   SpO2 97%   BMI 54.82 kg/m   Physical Exam  Constitutional: She is oriented to person, place, and time. She appears well-developed and well-nourished.  HENT:  Head: Atraumatic.  Eyes: Conjunctivae are normal. Pupils are equal, round, and reactive to light.  Neck: Neck supple.  Cardiovascular: Normal rate and regular rhythm.  Pulmonary/Chest: Effort normal and breath sounds normal.  Abdominal: Soft.  Musculoskeletal: She exhibits tenderness.  Neurological: She is alert and oriented to person, place, and time.  Skin: Skin is warm and dry.  Psychiatric: She has a normal mood and affect.  Nursing note and vitals reviewed.    ED Treatments / Results  Labs (all labs ordered are listed, but only abnormal results are displayed) Labs Reviewed - No data to display  EKG  EKG Interpretation None       Radiology Dg Shoulder Right  Result Date: 09/28/2017 CLINICAL DATA:  Pain following fall EXAM: RIGHT SHOULDER - 2+ VIEW COMPARISON:  None. FINDINGS: Frontal and Y scapular images were obtained. There is an obliquely oriented fracture of the proximal humeral diaphysis extending superiorly and laterally into the right humeral metaphysis proximally. Overall alignment is near anatomic. No other fracture. No dislocation. There is moderate generalized osteoarthritic change. Visualized right  lung is clear. IMPRESSION: Obliquely oriented fracture proximal right humerus with alignment overall near anatomic. No other fracture. No dislocation. Moderate generalized osteoarthritic change. Electronically Signed   By: Lowella Grip III M.D.   On: 09/28/2017 14:49    Procedures Procedures (including critical care time)  Medications Ordered in ED Medications - No data to display   Initial Impression / Assessment and Plan / ED Course  I have reviewed the triage vital signs and the nursing notes.  Pertinent labs & imaging results that were available during my care of the patient were reviewed by me and considered in my medical decision making (see chart for details).     Patient X-Ray reveals proximal humerus fracture.  Pt referred to follow up with orthopedics. Patient given shoulder immobilizer while in ED, care instructions provided and discussed. Patient will be discharged home & is agreeable with above plan. Returns precautions discussed. Pt appears safe for discharge.  Final Clinical Impressions(s) / ED Diagnoses   Final diagnoses:  Other nondisplaced fracture of upper end of right humerus, initial encounter for closed fracture    ED Discharge Orders        Ordered    oxyCODONE (ROXICODONE) 5 MG immediate release tablet  Every 6 hours PRN     09/28/17 1713       Etta Quill, NP 09/28/17 1719    Sherwood Gambler, MD 09/29/17 0009

## 2017-09-28 NOTE — ED Notes (Signed)
Bed: VT82 Expected date:  Expected time:  Means of arrival:  Comments: Triage 4

## 2017-10-20 ENCOUNTER — Encounter: Payer: Self-pay | Admitting: *Deleted

## 2017-10-20 NOTE — Telephone Encounter (Signed)
This encounter was created in error - please disregard.

## 2017-12-08 ENCOUNTER — Inpatient Hospital Stay: Payer: BC Managed Care – PPO | Attending: Oncology

## 2017-12-08 ENCOUNTER — Ambulatory Visit: Payer: BC Managed Care – PPO | Admitting: Oncology

## 2017-12-08 ENCOUNTER — Inpatient Hospital Stay: Payer: BC Managed Care – PPO | Admitting: Oncology

## 2017-12-08 ENCOUNTER — Telehealth: Payer: Self-pay | Admitting: Oncology

## 2017-12-08 ENCOUNTER — Other Ambulatory Visit: Payer: BC Managed Care – PPO

## 2017-12-08 VITALS — BP 131/72 | HR 91 | Temp 97.6°F | Resp 18 | Ht 67.0 in | Wt 357.2 lb

## 2017-12-08 DIAGNOSIS — K7460 Unspecified cirrhosis of liver: Secondary | ICD-10-CM | POA: Diagnosis not present

## 2017-12-08 DIAGNOSIS — D696 Thrombocytopenia, unspecified: Secondary | ICD-10-CM

## 2017-12-08 DIAGNOSIS — D61818 Other pancytopenia: Secondary | ICD-10-CM

## 2017-12-08 LAB — CBC WITH DIFFERENTIAL/PLATELET
Basophils Absolute: 0 10*3/uL (ref 0.0–0.1)
Basophils Relative: 0 %
EOS ABS: 0.1 10*3/uL (ref 0.0–0.5)
EOS PCT: 2 %
HCT: 42.3 % (ref 34.8–46.6)
Hemoglobin: 14 g/dL (ref 11.6–15.9)
LYMPHS ABS: 0.9 10*3/uL (ref 0.9–3.3)
Lymphocytes Relative: 27 %
MCH: 30 pg (ref 25.1–34.0)
MCHC: 33.1 g/dL (ref 31.5–36.0)
MCV: 90.6 fL (ref 79.5–101.0)
MONOS PCT: 8 %
Monocytes Absolute: 0.3 10*3/uL (ref 0.1–0.9)
Neutro Abs: 2.1 10*3/uL (ref 1.5–6.5)
Neutrophils Relative %: 63 %
PLATELETS: 29 10*3/uL — AB (ref 145–400)
RBC: 4.67 MIL/uL (ref 3.70–5.45)
RDW: 15.4 % — ABNORMAL HIGH (ref 11.2–14.5)
WBC: 3.4 10*3/uL — ABNORMAL LOW (ref 3.9–10.3)

## 2017-12-08 NOTE — Progress Notes (Signed)
Hematology and Oncology Follow Up Visit  Hannah Khan [id number] [birth year] 62 y.o. 12/08/2017 11:59 AM  CC: Hannah Silence, MD Hannah Khan, M.D. Hannah Ruff. Redmond Pulling, MD Hannah Khan, M.D.    Principle Diagnosis: 62 year old woman with:   1. Thrombocytopenia diagnosed in 2010.  Etiology of thrombocytopenia is related to cirrhosis of the liver as well as splenic sequestration. 2.     Tubular adenoma, without any evidence of high-grade dysplasia or malignancy status post colectomy in 2011.   Current therapy: Observation, surveillance  Interim History: [name] presents today for a follow-up visit.  She reports few issues since the last visit.  She underwent breast surgery in December 2018 without any postoperative complications of bleeding.  The pathology showed ductal hyperplasia without any evidence of malignancy.  She did well postoperatively without any issues.  She sustained a fall in January 2019 and suffered a fracture of her left humerus.  She is currently in a sling and has not required surgery at this time.  He did have substantial bruising associated with that fall but no bleeding noted.  She has not been working since that time.  She denies any hematochezia, melena or any active bleeding.  She does have substantial amount of pain associated with her shoulder.   She does not report any headaches, blurry vision, syncope or seizures. She does not report any fevers or chills or sweats.She has not reported any chest pain or shortness of breath.  She denied any cough, wheezing or hemoptysis.  She does not report any nausea, vomiting or abdominal pain.  She denies any frequency urgency or hesitancy.  She denied any ecchymosis or petechiae.  She denies any lymphadenopathy.  Rest of her review of systems is negative.  Medications: I have reviewed the patient's current medications.  Current Outpatient Medications  Medication Sig Dispense Refill  . bumetanide (BUMEX) 2 MG  tablet Take 2-4 mg by mouth See admin instructions. Take 4 mgs by mouth once daily, may take an additional 88m as needed for swelling    . Capsaicin-Menthol (SALONPAS GEL EX) Apply 1 application topically as needed (for pain).    .Marland Kitchendiltiazem (CARDIZEM CD) 240 MG 24 hr capsule Take 1 capsule (240 mg total) by mouth daily. 90 capsule 2  . JARDIANCE 25 MG TABS tablet Take 25 mg by mouth daily.  5  . KLOR-CON M20 20 MEQ tablet Take 20 mEq by mouth daily.   3  . methocarbamol (ROBAXIN) 500 MG tablet Take 500-750 mg by mouth daily as needed for muscle spasms.     .Marland KitchenoxyCODONE (OXY IR/ROXICODONE) 5 MG immediate release tablet Take 1-2 tablets (5-10 mg total) by mouth every 6 (six) hours as needed for severe pain. (Patient not taking: Reported on 09/28/2017) 15 tablet 0  . oxyCODONE (ROXICODONE) 5 MG immediate release tablet Take 1 tablet (5 mg total) by mouth every 6 (six) hours as needed for severe pain. 12 tablet 0  . propranolol (INDERAL) 10 MG tablet Take 1 tablet (10 mg total) by mouth 2 (two) times daily. 180 tablet 3  . spironolactone (ALDACTONE) 25 MG tablet Take 25 mg by mouth daily.      No current facility-administered medications for this visit.     Allergies:  Allergies  Allergen Reactions  . Celecoxib Other (See Comments)    "speech slurred" with celebrex  . Shellfish Allergy Swelling and Rash    TINGLING AND SWELLING OF LIPS. LARGE WHELPS QUARTER-SIZE  . Barium-Containing Compounds Other (  See Comments)    TACHYCARDIA  . Prednisone Other (See Comments)    TACHYCARDIA  . Statins Other (See Comments)    AGGRAVATED FIBROMYALGIA  . Ibuprofen Other (See Comments)    UNSPECIFIED SPECIFIC REACTION >> "DUE TO PLATELETS"  . Tylenol [Acetaminophen] Other (See Comments)    UNSPECIFIED SPECIFIC REACTION >> "DUE TO PLATELETS"    Past Medical History, Surgical history, Social history, and Family History were reviewed and updated.   Physical Exam: Blood pressure 131/72, pulse 91,  temperature 97.6 F (36.4 C), temperature source Oral, resp. rate 18, height 5' 7"  (1.702 m), weight (!) 357 lb 3.2 oz (162 kg), SpO2 97 %.   ECOG: 1 General appearance: Comfortable appearing woman appeared without distress.  Her arm in a sling. Head: Atraumatic without abnormalities. Oropharynx: Without any thrush or ulcers. Eyes: No scleral icterus. Lymph nodes: Cervical, supraclavicular, and axillary nodes normal. Heart: Irregular without any murmurs or gallops. Lung: Clear to auscultation without any rhonchi, wheezes or dullness to percussion. Abdomen: Soft, nontender without any rebound or guarding. Musculoskeletal: No joint deformity or effusion.  Her right arm in a sling.  Limited range of motion in the shoulder. Skin: No rashes or lesions.  Very limited ecchymosis around the vena puncture site.   Lab Results: Lab Results  Component Value Date   WBC 4.3 08/28/2017   HGB 13.2 08/28/2017   HCT 39.3 08/28/2017   MCV 89.9 08/28/2017   PLT 70 (L) 08/28/2017     Chemistry      Component Value Date/Time   NA 138 08/28/2017 1026   NA 139 06/09/2017 0000   NA 142 08/01/2014 1509   K 4.0 08/28/2017 1026   K 4.1 08/01/2014 1509   CL 107 08/28/2017 1026   CL 107 08/03/2012 1449   CO2 24 08/28/2017 1026   CO2 23 08/01/2014 1509   BUN 16 08/28/2017 1026   BUN 17 06/09/2017 0000   BUN 16.9 08/01/2014 1509   CREATININE 0.93 08/28/2017 1026   CREATININE 0.77 06/28/2016 1157   CREATININE 0.7 08/01/2014 1509      Component Value Date/Time   CALCIUM 9.0 08/28/2017 1026   CALCIUM 9.3 08/01/2014 1509   ALKPHOS 80 07/17/2017 1113   ALKPHOS 82 08/01/2014 1509   AST 36 07/17/2017 1113   AST 54 (H) 08/01/2014 1509   ALT 22 07/17/2017 1113   ALT 30 08/01/2014 1509   BILITOT 2.2 (H) 07/17/2017 1113   BILITOT 1.71 (H) 08/01/2014 5043       62 year old woman with:  1.  Thrombocytopenia: Etiology is related to cirrhosis of the liver and splenic sequestration.  Her platelet count  currently at 29,000 which is close to her baseline.  No active bleeding is noted at this time and does not require any intervention.  She has responded in the past to Nplate in preparation for breast surgery.  This can be used in the future if needed.  2.  Humerus fracture: She continues follows with orthopedic surgery.  He is currently in a sling and might require surgery if it does not heal in the future.  She will require probably platelet boost before her surgery if that is to happen.  Retreatment with Nplate can be a possibility.  3.  Ductal papilloma with focal atypical ductal hyperplasia of the right breast.  She is status post lumpectomy done on August 29, 2017.  No further management is needed at this time.  She will continue to follow with mammograms.  4.  Cirrhosis of the liver: No decompensation noted since the last visit.  5.  Follow-up: We will be in 6 months sooner if needed.   15  minutes was spent with the patient face-to-face today.  More than 50% of time was dedicated to patient counseling, education and coordination of her care.      Zola Button 3/22/201911:59 AM

## 2017-12-08 NOTE — Telephone Encounter (Signed)
Scheduled appt per 3/22 los - per patient request scheduled for 3/27 instead of 3/25 - Gave patient AVS and calender per los.

## 2018-01-10 ENCOUNTER — Other Ambulatory Visit: Payer: Self-pay | Admitting: Gastroenterology

## 2018-01-10 DIAGNOSIS — K7460 Unspecified cirrhosis of liver: Secondary | ICD-10-CM

## 2018-03-09 ENCOUNTER — Other Ambulatory Visit: Payer: Self-pay | Admitting: Cardiovascular Disease

## 2018-03-14 ENCOUNTER — Emergency Department (HOSPITAL_BASED_OUTPATIENT_CLINIC_OR_DEPARTMENT_OTHER): Payer: BC Managed Care – PPO

## 2018-03-14 ENCOUNTER — Encounter (HOSPITAL_BASED_OUTPATIENT_CLINIC_OR_DEPARTMENT_OTHER): Payer: Self-pay

## 2018-03-14 ENCOUNTER — Inpatient Hospital Stay (HOSPITAL_BASED_OUTPATIENT_CLINIC_OR_DEPARTMENT_OTHER)
Admission: EM | Admit: 2018-03-14 | Discharge: 2018-03-23 | DRG: 872 | Disposition: A | Discharge status: Home Health | Payer: BC Managed Care – PPO | Attending: Internal Medicine | Admitting: Internal Medicine

## 2018-03-14 ENCOUNTER — Other Ambulatory Visit: Payer: Self-pay

## 2018-03-14 DIAGNOSIS — Z872 Personal history of diseases of the skin and subcutaneous tissue: Secondary | ICD-10-CM | POA: Diagnosis not present

## 2018-03-14 DIAGNOSIS — Z886 Allergy status to analgesic agent status: Secondary | ICD-10-CM

## 2018-03-14 DIAGNOSIS — K766 Portal hypertension: Secondary | ICD-10-CM | POA: Diagnosis present

## 2018-03-14 DIAGNOSIS — M1711 Unilateral primary osteoarthritis, right knee: Secondary | ICD-10-CM | POA: Diagnosis present

## 2018-03-14 DIAGNOSIS — H532 Diplopia: Secondary | ICD-10-CM | POA: Diagnosis not present

## 2018-03-14 DIAGNOSIS — I5032 Chronic diastolic (congestive) heart failure: Secondary | ICD-10-CM | POA: Diagnosis present

## 2018-03-14 DIAGNOSIS — D696 Thrombocytopenia, unspecified: Secondary | ICD-10-CM | POA: Diagnosis present

## 2018-03-14 DIAGNOSIS — I482 Chronic atrial fibrillation: Secondary | ICD-10-CM | POA: Diagnosis present

## 2018-03-14 DIAGNOSIS — E119 Type 2 diabetes mellitus without complications: Secondary | ICD-10-CM | POA: Diagnosis present

## 2018-03-14 DIAGNOSIS — L0390 Cellulitis, unspecified: Secondary | ICD-10-CM | POA: Diagnosis not present

## 2018-03-14 DIAGNOSIS — D6959 Other secondary thrombocytopenia: Secondary | ICD-10-CM | POA: Diagnosis present

## 2018-03-14 DIAGNOSIS — L03115 Cellulitis of right lower limb: Secondary | ICD-10-CM

## 2018-03-14 DIAGNOSIS — B49 Unspecified mycosis: Secondary | ICD-10-CM | POA: Diagnosis not present

## 2018-03-14 DIAGNOSIS — D693 Immune thrombocytopenic purpura: Secondary | ICD-10-CM | POA: Diagnosis present

## 2018-03-14 DIAGNOSIS — K5900 Constipation, unspecified: Secondary | ICD-10-CM | POA: Diagnosis present

## 2018-03-14 DIAGNOSIS — Z91013 Allergy to seafood: Secondary | ICD-10-CM

## 2018-03-14 DIAGNOSIS — K7469 Other cirrhosis of liver: Secondary | ICD-10-CM | POA: Diagnosis not present

## 2018-03-14 DIAGNOSIS — R443 Hallucinations, unspecified: Secondary | ICD-10-CM | POA: Diagnosis not present

## 2018-03-14 DIAGNOSIS — K7581 Nonalcoholic steatohepatitis (NASH): Secondary | ICD-10-CM | POA: Diagnosis present

## 2018-03-14 DIAGNOSIS — K7460 Unspecified cirrhosis of liver: Secondary | ICD-10-CM | POA: Diagnosis present

## 2018-03-14 DIAGNOSIS — R42 Dizziness and giddiness: Secondary | ICD-10-CM | POA: Diagnosis not present

## 2018-03-14 DIAGNOSIS — I481 Persistent atrial fibrillation: Secondary | ICD-10-CM | POA: Diagnosis present

## 2018-03-14 DIAGNOSIS — I4510 Unspecified right bundle-branch block: Secondary | ICD-10-CM | POA: Diagnosis present

## 2018-03-14 DIAGNOSIS — E876 Hypokalemia: Secondary | ICD-10-CM | POA: Diagnosis present

## 2018-03-14 DIAGNOSIS — B3741 Candidal cystitis and urethritis: Secondary | ICD-10-CM | POA: Diagnosis present

## 2018-03-14 DIAGNOSIS — Z889 Allergy status to unspecified drugs, medicaments and biological substances status: Secondary | ICD-10-CM

## 2018-03-14 DIAGNOSIS — M79609 Pain in unspecified limb: Secondary | ICD-10-CM | POA: Diagnosis not present

## 2018-03-14 DIAGNOSIS — I4891 Unspecified atrial fibrillation: Secondary | ICD-10-CM | POA: Diagnosis present

## 2018-03-14 DIAGNOSIS — A419 Sepsis, unspecified organism: Principal | ICD-10-CM | POA: Diagnosis present

## 2018-03-14 DIAGNOSIS — D61818 Other pancytopenia: Secondary | ICD-10-CM | POA: Diagnosis present

## 2018-03-14 DIAGNOSIS — Z6841 Body Mass Index (BMI) 40.0 and over, adult: Secondary | ICD-10-CM

## 2018-03-14 DIAGNOSIS — I480 Paroxysmal atrial fibrillation: Secondary | ICD-10-CM | POA: Diagnosis present

## 2018-03-14 DIAGNOSIS — M8450XA Pathological fracture in neoplastic disease, unspecified site, initial encounter for fracture: Secondary | ICD-10-CM | POA: Diagnosis not present

## 2018-03-14 DIAGNOSIS — I4819 Other persistent atrial fibrillation: Secondary | ICD-10-CM

## 2018-03-14 DIAGNOSIS — E1169 Type 2 diabetes mellitus with other specified complication: Secondary | ICD-10-CM

## 2018-03-14 DIAGNOSIS — I4821 Permanent atrial fibrillation: Secondary | ICD-10-CM | POA: Diagnosis present

## 2018-03-14 DIAGNOSIS — M797 Fibromyalgia: Secondary | ICD-10-CM | POA: Diagnosis present

## 2018-03-14 DIAGNOSIS — R6520 Severe sepsis without septic shock: Secondary | ICD-10-CM | POA: Diagnosis present

## 2018-03-14 DIAGNOSIS — I110 Hypertensive heart disease with heart failure: Secondary | ICD-10-CM | POA: Diagnosis present

## 2018-03-14 DIAGNOSIS — E872 Acidosis: Secondary | ICD-10-CM | POA: Diagnosis present

## 2018-03-14 DIAGNOSIS — I5033 Acute on chronic diastolic (congestive) heart failure: Secondary | ICD-10-CM | POA: Diagnosis present

## 2018-03-14 DIAGNOSIS — R112 Nausea with vomiting, unspecified: Secondary | ICD-10-CM | POA: Diagnosis not present

## 2018-03-14 DIAGNOSIS — R0902 Hypoxemia: Secondary | ICD-10-CM

## 2018-03-14 DIAGNOSIS — G4733 Obstructive sleep apnea (adult) (pediatric): Secondary | ICD-10-CM | POA: Diagnosis present

## 2018-03-14 DIAGNOSIS — Z794 Long term (current) use of insulin: Secondary | ICD-10-CM

## 2018-03-14 DIAGNOSIS — I10 Essential (primary) hypertension: Secondary | ICD-10-CM | POA: Diagnosis present

## 2018-03-14 DIAGNOSIS — M7989 Other specified soft tissue disorders: Secondary | ICD-10-CM | POA: Diagnosis not present

## 2018-03-14 DIAGNOSIS — Z79899 Other long term (current) drug therapy: Secondary | ICD-10-CM

## 2018-03-14 DIAGNOSIS — N179 Acute kidney failure, unspecified: Secondary | ICD-10-CM | POA: Diagnosis present

## 2018-03-14 DIAGNOSIS — Z87891 Personal history of nicotine dependence: Secondary | ICD-10-CM | POA: Diagnosis not present

## 2018-03-14 DIAGNOSIS — Z888 Allergy status to other drugs, medicaments and biological substances status: Secondary | ICD-10-CM | POA: Diagnosis not present

## 2018-03-14 DIAGNOSIS — G8929 Other chronic pain: Secondary | ICD-10-CM | POA: Diagnosis present

## 2018-03-14 HISTORY — DX: Other persistent atrial fibrillation: I48.19

## 2018-03-14 HISTORY — DX: Thrombocytopenia, unspecified: D69.6

## 2018-03-14 LAB — CBC WITH DIFFERENTIAL/PLATELET
Basophils Absolute: 0 10*3/uL (ref 0.0–0.1)
Basophils Relative: 0 %
EOS ABS: 0 10*3/uL (ref 0.0–0.7)
Eosinophils Relative: 0 %
HCT: 40.9 % (ref 36.0–46.0)
HEMOGLOBIN: 14.2 g/dL (ref 12.0–15.0)
Lymphocytes Relative: 3 %
Lymphs Abs: 0.3 10*3/uL — ABNORMAL LOW (ref 0.7–4.0)
MCH: 31 pg (ref 26.0–34.0)
MCHC: 34.7 g/dL (ref 30.0–36.0)
MCV: 89.3 fL (ref 78.0–100.0)
MONOS PCT: 7 %
Monocytes Absolute: 0.9 10*3/uL (ref 0.1–1.0)
NEUTROS ABS: 11.4 10*3/uL — AB (ref 1.7–7.7)
Neutrophils Relative %: 90 %
Platelets: 32 10*3/uL — ABNORMAL LOW (ref 150–400)
RBC: 4.58 MIL/uL (ref 3.87–5.11)
RDW: 16 % — ABNORMAL HIGH (ref 11.5–15.5)
WBC: 12.6 10*3/uL — ABNORMAL HIGH (ref 4.0–10.5)

## 2018-03-14 LAB — BASIC METABOLIC PANEL
Anion gap: 13 (ref 5–15)
BUN: 25 mg/dL — AB (ref 8–23)
CALCIUM: 8.9 mg/dL (ref 8.9–10.3)
CO2: 19 mmol/L — ABNORMAL LOW (ref 22–32)
Chloride: 102 mmol/L (ref 98–111)
Creatinine, Ser: 1.15 mg/dL — ABNORMAL HIGH (ref 0.44–1.00)
GFR calc Af Amer: 58 mL/min — ABNORMAL LOW (ref >60)
GFR, EST NON AFRICAN AMERICAN: 50 mL/min — AB (ref >60)
GLUCOSE: 164 mg/dL — AB (ref 70–99)
Potassium: 3.5 mmol/L (ref 3.5–5.1)
SODIUM: 134 mmol/L — AB (ref 135–145)

## 2018-03-14 LAB — HEPATIC FUNCTION PANEL
ALBUMIN: 3.3 g/dL — AB (ref 3.5–5.0)
ALK PHOS: 78 U/L (ref 38–126)
ALT: 25 U/L (ref 0–44)
AST: 59 U/L — ABNORMAL HIGH (ref 15–41)
BILIRUBIN DIRECT: 0.8 mg/dL — AB (ref 0.0–0.2)
BILIRUBIN TOTAL: 5 mg/dL — AB (ref 0.3–1.2)
Indirect Bilirubin: 4.2 mg/dL — ABNORMAL HIGH (ref 0.3–0.9)
Total Protein: 6.7 g/dL (ref 6.5–8.1)

## 2018-03-14 LAB — I-STAT CG4 LACTIC ACID, ED: Lactic Acid, Venous: 4.76 mmol/L (ref 0.5–1.9)

## 2018-03-14 MED ORDER — SODIUM CHLORIDE 0.9 % IV SOLN: INTRAVENOUS | Status: DC

## 2018-03-14 MED ORDER — VANCOMYCIN HCL 1000 MG IV SOLR
INTRAVENOUS | Status: AC
  Filled 2018-03-14: qty 2000

## 2018-03-14 MED ORDER — KETOROLAC TROMETHAMINE 15 MG/ML IJ SOLN
15.0000 mg | Freq: Once | INTRAMUSCULAR | Status: AC
  Administered 2018-03-14: 15 mg via INTRAVENOUS
  Filled 2018-03-14: qty 1

## 2018-03-14 MED ORDER — SODIUM CHLORIDE 0.9 % IV BOLUS
1000.0000 mL | Freq: Once | INTRAVENOUS | Status: AC
Start: 2018-03-14 — End: 2018-03-14
  Administered 2018-03-14: 1000 mL via INTRAVENOUS

## 2018-03-14 MED ORDER — VANCOMYCIN HCL IN DEXTROSE 1-5 GM/200ML-% IV SOLN
INTRAVENOUS | Status: AC
  Filled 2018-03-14: qty 200

## 2018-03-14 MED ORDER — SODIUM CHLORIDE 0.9 % IV BOLUS
1000.0000 mL | Freq: Once | INTRAVENOUS | Status: AC
  Administered 2018-03-14: 1000 mL via INTRAVENOUS

## 2018-03-14 MED ORDER — VANCOMYCIN HCL 10 G IV SOLR
2000.0000 mg | Freq: Once | INTRAVENOUS | Status: AC
  Administered 2018-03-15: 2000 mg via INTRAVENOUS
  Filled 2018-03-14: qty 2000

## 2018-03-14 MED ORDER — PIPERACILLIN-TAZOBACTAM 3.375 G IVPB 30 MIN
3.3750 g | Freq: Once | INTRAVENOUS | Status: AC
  Administered 2018-03-14: 3.375 g via INTRAVENOUS
  Filled 2018-03-14 (×2): qty 50

## 2018-03-14 NOTE — ED Provider Notes (Signed)
[facility] Provider Note   CSN: [id number] Arrival date & time: 03/14/18  2127     History   Chief Complaint Chief Complaint  Patient presents with  . Emesis    HPI Hannah Khan is a 62 y.o. female.  62 yo F with a chief complaints of nausea vomiting and right lower extremity pain.  This been going on for the past day.  Started mostly with nausea and vomiting now has started to notice the pain to the outside of the right leg.  Had a fever at home of 101.  Took some Tylenol with minimal relief.  Having trouble keeping things down at home.  She denies cough or congestion denies dysuria denies abdominal pain.  Denies diarrhea.  The history is provided by the patient and the spouse.  Illness  This is a new problem. The current episode started 6 to 12 hours ago. The problem occurs constantly. The problem has been gradually worsening. Pertinent negatives include no chest pain, no abdominal pain, no headaches and no shortness of breath. Nothing aggravates the symptoms. Nothing relieves the symptoms. She has tried nothing for the symptoms. The treatment provided no relief.    Past Medical History:  Diagnosis Date  . Anemia    hx of  . Arthritis    knees  . Atrial fibrillation ([place])   . Blood dyscrasia   . Diabetes mellitus without complication ([place])    type 2  . Dysrhythmia    a fib  . Fibromyalgia   . HTN (hypertension)   . Hx of colonic polyps    s/p partial colectomy  . Hyperlipidemia   . ITP (idiopathic thrombocytopenic purpura)   . Joint pain    in knees and back spasms  . Left leg swelling    wear compression hose  . Liver cirrhosis secondary to NASH (nonalcoholic steatohepatitis) ([place]) dx nov 2014   had enlarged spleen also   . Low blood pressure    bp varies  . Morbid obesity ([place])   . Pneumonia feb 2018 weel now  . PONV (postoperative nausea and vomiting)    in past none recent  . Portal hypertension ([place])   . Shortness of  breath dyspnea    history of with fluid overload takes diuretics improved now, with exertion  . Sleep apnea    uses cpap setting of 14  . Sore on scalp    may bleed secondary to ITP comes and goes none now  . Spleen enlarged     Patient Active Problem List   Diagnosis Date Noted  . Severe sepsis ([place]) 03/14/2018  . Acute hypoxemic respiratory failure ([place]) 11/01/2016  . Community acquired pneumonia of right lower lobe of lung ([place]) 10/31/2016  . Muscular abdominal pain in right upper quadrant 07/11/2016  . Chronic diastolic CHF (congestive heart failure) ([place]) 07/10/2016  . Thrombocytopenia ([place]) 07/10/2016  . Permanent atrial fibrillation ([place]) 01/30/2016  . Type 2 diabetes mellitus without complication, without long-term current use of insulin ([place]) 01/30/2016  . Acute on chronic diastolic CHF (congestive heart failure), NYHA class 2 ([place]) 01/26/2016  . Morbid obesity- BMI 56 12/10/2015  . Melena 01/31/2015  . Aortic heart murmur 01/31/2015  . Cirrhosis of liver, with portal HTN & Acites, by CT Abdomen 07/20/13 07/23/2013  . Pancytopenia ([place]) 07/23/2013  . Obstructive sleep apnea 08/07/2007  . Essential hypertension 08/07/2007  . ALLERGIC RHINITIS 08/07/2007    Past Surgical History:  Procedure Laterality Date  .  BREAST LUMPECTOMY WITH RADIOACTIVE SEED LOCALIZATION Right 08/29/2017   Procedure: RIGHT BREAST LUMPECTOMY WITH RADIOACTIVE SEEDS LOCALIZATION;  Surgeon: Erroll Luna, MD;  Location: [facility];  Service: General;  Laterality: Right;  . CESAREAN SECTION  1989  . CHOLECYSTECTOMY  20 yrs ago  . COLECTOMY  2011  . COLONOSCOPY    . DILATATION & CURETTAGE/HYSTEROSCOPY WITH MYOSURE N/A 05/06/2016   Procedure: DILATATION & CURETTAGE/HYSTEROSCOPY WITH MYOSURE WITH POLYPECTOMY;  Surgeon: Janyth Pupa, DO;  Location: [facility];  Service: Gynecology;  Laterality: N/A;  . ESOPHAGOGASTRODUODENOSCOPY (EGD) WITH PROPOFOL N/A 09/04/2013   Procedure: ESOPHAGOGASTRODUODENOSCOPY (EGD)  WITH PROPOFOL;  Surgeon: [name], MD;  Location: [facility];  Service: Endoscopy;  Laterality: N/A;  . KNEE ARTHROSCOPY  yrs ago   bilateral, one done x 1, one done twice     OB History   None      Home Medications    Prior to Admission medications   Medication Sig Start Date End Date Taking? Authorizing Provider  bumetanide (BUMEX) 2 MG tablet Take 2-4 mg by mouth See admin instructions. Take 4 mgs by mouth once daily, may take an additional 31m as needed for swelling    [provider]  Capsaicin-Menthol (SALONPAS GEL EX) Apply 1 application topically as needed (for pain).    [provider]  diltiazem (CARDIZEM CD) 240 MG 24 hr capsule Take 1 capsule (240 mg total) by mouth daily. 07/11/17   RSkeet Latch MD  JARDIANCE 25 MG TABS tablet Take 25 mg by mouth daily. 01/28/17   [provider]  KLOR-CON M20 20 MEQ tablet Take 20 mEq by mouth daily.  03/23/17   [provider]  methocarbamol (ROBAXIN) 500 MG tablet Take 500-750 mg by mouth daily as needed for muscle spasms.     [provider]  oxyCODONE (OXY IR/ROXICODONE) 5 MG immediate release tablet Take 1-2 tablets (5-10 mg total) by mouth every 6 (six) hours as needed for severe pain. Patient not taking: Reported on 09/28/2017 08/29/17   CErroll Luna MD  oxyCODONE (ROXICODONE) 5 MG immediate release tablet Take 1 tablet (5 mg total) by mouth every 6 (six) hours as needed for severe pain. 09/28/17   SEtta Quill NP  propranolol (INDERAL) 10 MG tablet TAKE 1 TABLET BY MOUTH TWICE A DAY 03/09/18   RSkeet Latch MD  spironolactone (ALDACTONE) 25 MG tablet Take 25 mg by mouth daily.  07/24/13   SThurnell Lose MD    Family History Family History  Problem Relation Age of Onset  . Heart failure Mother   . Cancer Mother   . Hyperlipidemia Mother   . Congestive Heart Failure Mother   . Stroke Mother   . Lung cancer Father   . Cancer Father   . Cancer Brother   .  Hyperlipidemia Brother   . Stroke Other     Social History Social History   Tobacco Use  . Smoking status: Former Smoker    Packs/day: 1.00    Years: 3.00    Pack years: 3.00    Types: Cigarettes    Last attempt to quit: 09/19/1974    Years since quitting: 43.5  . Smokeless tobacco: Never Used  Substance Use Topics  . Alcohol use: Yes    Comment: occ  . Drug use: No     Allergies   Celecoxib; Shellfish allergy; Barium-containing compounds; Prednisone; Statins; Ibuprofen; and Tylenol [acetaminophen]   Review of Systems Review of Systems  Constitutional: Positive for chills and fever.  HENT: Negative for congestion and rhinorrhea.   Eyes: Negative for redness and visual disturbance.  Respiratory: Negative for shortness of breath and wheezing.   Cardiovascular: Negative for chest pain and palpitations.  Gastrointestinal: Positive for nausea and vomiting. Negative for abdominal pain.  Genitourinary: Negative for dysuria and urgency.  Musculoskeletal: Positive for myalgias. Negative for arthralgias.  Skin: Negative for pallor and wound.  Neurological: Negative for dizziness and headaches.     Physical Exam Updated Vital Signs BP 94/71 (BP Location: Left Arm)   Pulse (!) 117   Temp 98.2 F (36.8 C)   Resp (!) 25   Ht 5' 7"  (1.702 m)   Wt (!) 162.9 kg (359 lb 2.1 oz)   SpO2 94%   BMI 56.25 kg/m   Physical Exam  Constitutional: She is oriented to person, place, and time. She appears well-developed and well-nourished. No distress.  HENT:  Head: Normocephalic and atraumatic.  Eyes: Pupils are equal, round, and reactive to light. EOM are normal.  Neck: Normal range of motion. Neck supple.  Cardiovascular: An irregularly irregular rhythm present. Tachycardia present. Exam reveals no gallop and no friction rub.  No murmur heard. Pulmonary/Chest: Effort normal. She has no wheezes. She has no rales.  Abdominal: Soft. She exhibits no distension. There is no tenderness.    Musculoskeletal: She exhibits no edema or tenderness.  Erythema and warmth to the lateral aspect of the right leg.  Area of scabbing to the anterior aspect of the leg.  Some erythema but not warm to the medial thigh.   Neurological: She is alert and oriented to person, place, and time.  Skin: Skin is warm and dry. She is not diaphoretic.  Psychiatric: She has a normal mood and affect. Her behavior is normal.  Nursing note and vitals reviewed.    ED Treatments / Results  Labs (all labs ordered are listed, but only abnormal results are displayed) Labs Reviewed  CBC WITH DIFFERENTIAL/PLATELET - Abnormal; Notable for the following components:      Result Value   WBC 12.6 (*)    RDW 16.0 (*)    Platelets 32 (*)    Neutro Abs 11.4 (*)    Lymphs Abs 0.3 (*)    All other components within normal limits  BASIC METABOLIC PANEL - Abnormal; Notable for the following components:   Sodium 134 (*)    CO2 19 (*)    Glucose, Bld 164 (*)    BUN 25 (*)    Creatinine, Ser 1.15 (*)    GFR calc non Af Amer 50 (*)    GFR calc Af Amer 58 (*)    All other components within normal limits  HEPATIC FUNCTION PANEL - Abnormal; Notable for the following components:   Albumin 3.3 (*)    AST 59 (*)    Total Bilirubin 5.0 (*)    Bilirubin, Direct 0.8 (*)    Indirect Bilirubin 4.2 (*)    All other components within normal limits  I-STAT CG4 LACTIC ACID, ED - Abnormal; Notable for the following components:   Lactic Acid, Venous 4.76 (*)    All other components within normal limits  CULTURE, BLOOD (ROUTINE X 2)  CULTURE, BLOOD (ROUTINE X 2)  URINE CULTURE  URINALYSIS, ROUTINE W REFLEX MICROSCOPIC  I-STAT CG4 LACTIC ACID, ED    EKG EKG Interpretation  Date/Time:  Wednesday March 14 2018 22:45:03 EDT Ventricular Rate:  134 PR Interval:    QRS Duration: 98 QT Interval:  317 QTC Calculation:  474 R Axis:   -46 Text Interpretation:  Atrial fibrillation Left anterior fascicular block Low voltage,  precordial leads No significant change since last tracing Confirmed by Deno Etienne [id number] on 03/14/2018 10:55:11 PM   Radiology Dg Chest 2 View  Result Date: 03/14/2018 CLINICAL DATA:  62 year old female with fever and vomiting. EXAM: CHEST - 2 VIEW COMPARISON:  Chest radiograph dated 10/31/2016 FINDINGS: There is cardiomegaly with mild vascular congestion and a small pleural effusion. Superimposed pneumonia is not excluded. There is no focal consolidation or pneumothorax. No acute osseous pathology. IMPRESSION: Cardiomegaly findings of CHF. Pneumonia is not excluded. Clinical correlation is recommended. Electronically Signed   By: Anner Crete M.D.   On: 03/14/2018 23:58   Dg Tibia/fibula Right  Result Date: 03/14/2018 CLINICAL DATA:  Swelling and pain in the right lower extremity. EXAM: RIGHT TIBIA AND FIBULA - 2 VIEW COMPARISON:  None. FINDINGS: Osteoarthritis of the right knee with medial femorotibial joint space narrowing and spurring of the tibial spines and tibial plateau. Additionally there is patellofemoral joint space narrowing spurring. No joint effusion is noted. The ankle mortise and included subtalar joints are unremarkable. There is mild soft tissue induration of the leg compatible soft tissue edema. IMPRESSION: Mild diffuse soft tissue edema of the right leg. Osteoarthritis of the right knee. No acute osseous abnormality. Electronically Signed   By: Ashley Royalty M.D.   On: 03/14/2018 23:59    Procedures Procedures (including critical care time)  Medications Ordered in ED Medications  vancomycin (VANCOCIN) 2,000 mg in sodium chloride 0.9 % 500 mL IVPB (2,000 mg Intravenous New Bag/Given 03/15/18 0006)  vancomycin (VANCOCIN) 1000 MG powder (has no administration in time range)  0.9 %  sodium chloride infusion (has no administration in time range)  sodium chloride 0.9 % bolus 1,000 mL (0 mLs Intravenous Stopped 03/14/18 2315)  ketorolac (TORADOL) 15 MG/ML injection 15 mg (15 mg  Intravenous Given 03/14/18 2236)  sodium chloride 0.9 % bolus 1,000 mL (0 mLs Intravenous Stopped 03/15/18 0002)  piperacillin-tazobactam (ZOSYN) IVPB 3.375 g (0 g Intravenous Stopped 03/14/18 2346)  fentaNYL (SUBLIMAZE) injection 50 mcg (50 mcg Intravenous Given 03/15/18 0010)     Initial Impression / Assessment and Plan / ED Course  I have reviewed the triage vital signs and the nursing notes.  Pertinent labs & imaging results that were available during my care of the patient were reviewed by me and considered in my medical decision making (see chart for details).     62 yo F with a chief complaint of fevers leg pain and vomiting.  Clinically the patient has cellulitis though there is not a extremely large area of erythema.  She is afebrile here.  She is in A. fib with RVR.  Initial lactate was elevated at 4.7.  Code sepsis was initiated, given 30 cc/kg of ideal body weight which is 2 L.  Leukocytosis of 12.6 with a neutrophilic predominance.  She has thrombopenia which is a chronic problem for her.  Acidosis without anion gap.  I started her on broad-spectrum antibiotics blood cultures were obtained.  Will discuss with the hospitalist for admission.  Discussed with Dr. Alcario Drought, based on my note and my description of the patient he was wondering if there some other source or something that has not been yet considered.  I discussion about necrotizing fasciitis ensued, this certainly is a possibility as the patient has pain out of proportion and has a fairly elevated lactate level for the amount of cellulitis that  is evident.  Will obtain a CT scan with contrast of the lower leg to further evaluate.  Plain film without free air.  No areas of fluctuance on my exam to suggest superficial abscess.  CRITICAL CARE Performed by: Cecilio Asper   Total critical care time: 80 minutes  Critical care time was exclusive of separately billable procedures and treating other patients.  Critical care  was necessary to treat or prevent imminent or life-threatening deterioration.  Critical care was time spent personally by me on the following activities: development of treatment plan with patient and/or surrogate as well as nursing, discussions with consultants, evaluation of patient's response to treatment, examination of patient, obtaining history from patient or surrogate, ordering and performing treatments and interventions, ordering and review of laboratory studies, ordering and review of radiographic studies, pulse oximetry and re-evaluation of patient's condition.  The patients results and plan were reviewed and discussed.   Any x-rays performed were independently reviewed by myself.   Differential diagnosis were considered with the presenting HPI.  Medications  vancomycin (VANCOCIN) 2,000 mg in sodium chloride 0.9 % 500 mL IVPB (2,000 mg Intravenous New Bag/Given 03/15/18 0006)  vancomycin (VANCOCIN) 1000 MG powder (has no administration in time range)  0.9 %  sodium chloride infusion (has no administration in time range)  sodium chloride 0.9 % bolus 1,000 mL (0 mLs Intravenous Stopped 03/14/18 2315)  ketorolac (TORADOL) 15 MG/ML injection 15 mg (15 mg Intravenous Given 03/14/18 2236)  sodium chloride 0.9 % bolus 1,000 mL (0 mLs Intravenous Stopped 03/15/18 0002)  piperacillin-tazobactam (ZOSYN) IVPB 3.375 g (0 g Intravenous Stopped 03/14/18 2346)  fentaNYL (SUBLIMAZE) injection 50 mcg (50 mcg Intravenous Given 03/15/18 0010)    Vitals:   03/14/18 2133 03/14/18 2315 03/14/18 2320 03/15/18 0001  BP: (!) 103/59 91/65 109/66 94/71  Pulse: 77 (!) 121 (!) 124 (!) 117  Resp: (!) 24 (!) 27 (!) 25 (!) 25  Temp: 98.4 F (36.9 C)   98.2 F (36.8 C)  TempSrc: Oral     SpO2: 95% 95% 96% 94%  Weight: (!) 162.9 kg (359 lb 2.1 oz)     Height: 5' 7"  (1.702 m)       Final diagnoses:  Cellulitis of right lower extremity    Admission/ observation were discussed with the admitting physician,  patient and/or family and they are comfortable with the plan.   Final Clinical Impressions(s) / ED Diagnoses   Final diagnoses:  Cellulitis of right lower extremity    ED Discharge Orders    None       Deno Etienne, DO 03/15/18 0011

## 2018-03-14 NOTE — ED Notes (Signed)
Pt states that she has not felt well all day and had a few episodes of vomiting earlier this morning.  Per pt and husband, she had a fever of 100.5, husband gave 457m ibuprofen at 1430, no fever here or since then.  Pt has dry mucosa and is breathing quick, shallow breaths, attributing it to her A-Fib.  Pt missed her medications tonight, EDP informed.  Pt denies chest pain, c/o chronic hip and back pain.

## 2018-03-14 NOTE — ED Notes (Signed)
Patient transported to X-ray 

## 2018-03-14 NOTE — ED Triage Notes (Signed)
C/o n/v/fever x today-swelling and pain to right LE x today-denies injur-presents to triage in w/c

## 2018-03-15 ENCOUNTER — Inpatient Hospital Stay (HOSPITAL_COMMUNITY): Payer: BC Managed Care – PPO

## 2018-03-15 ENCOUNTER — Inpatient Hospital Stay (HOSPITAL_BASED_OUTPATIENT_CLINIC_OR_DEPARTMENT_OTHER): Payer: BC Managed Care – PPO

## 2018-03-15 ENCOUNTER — Encounter (HOSPITAL_COMMUNITY): Payer: Self-pay | Admitting: Pulmonary Disease

## 2018-03-15 DIAGNOSIS — L03115 Cellulitis of right lower limb: Secondary | ICD-10-CM

## 2018-03-15 DIAGNOSIS — I482 Chronic atrial fibrillation: Secondary | ICD-10-CM

## 2018-03-15 DIAGNOSIS — R6520 Severe sepsis without septic shock: Secondary | ICD-10-CM

## 2018-03-15 DIAGNOSIS — K7469 Other cirrhosis of liver: Secondary | ICD-10-CM

## 2018-03-15 DIAGNOSIS — N179 Acute kidney failure, unspecified: Secondary | ICD-10-CM

## 2018-03-15 DIAGNOSIS — E119 Type 2 diabetes mellitus without complications: Secondary | ICD-10-CM

## 2018-03-15 DIAGNOSIS — G4733 Obstructive sleep apnea (adult) (pediatric): Secondary | ICD-10-CM

## 2018-03-15 DIAGNOSIS — I4891 Unspecified atrial fibrillation: Secondary | ICD-10-CM

## 2018-03-15 DIAGNOSIS — A419 Sepsis, unspecified organism: Principal | ICD-10-CM

## 2018-03-15 DIAGNOSIS — I5032 Chronic diastolic (congestive) heart failure: Secondary | ICD-10-CM

## 2018-03-15 LAB — CORTISOL: Cortisol, Plasma: 45.4 ug/dL

## 2018-03-15 LAB — GLUCOSE, CAPILLARY
GLUCOSE-CAPILLARY: 130 mg/dL — AB (ref 70–99)
GLUCOSE-CAPILLARY: 128 mg/dL — AB (ref 70–99)
Glucose-Capillary: 137 mg/dL — ABNORMAL HIGH (ref 70–99)
Glucose-Capillary: 117 mg/dL — ABNORMAL HIGH (ref 70–99)
Glucose-Capillary: 184 mg/dL — ABNORMAL HIGH (ref 70–99)

## 2018-03-15 LAB — CBC
HEMATOCRIT: 40.9 % (ref 36.0–46.0)
HEMOGLOBIN: 13.6 g/dL (ref 12.0–15.0)
MCH: 30.9 pg (ref 26.0–34.0)
MCHC: 33.3 g/dL (ref 30.0–36.0)
MCV: 93 fL (ref 78.0–100.0)
Platelets: 24 10*3/uL — CL (ref 150–400)
RBC: 4.4 MIL/uL (ref 3.87–5.11)
RDW: 16 % — ABNORMAL HIGH (ref 11.5–15.5)
WBC: 9.4 10*3/uL (ref 4.0–10.5)

## 2018-03-15 LAB — RETICULOCYTES
RBC.: 4.51 MIL/uL (ref 3.87–5.11)
RETIC COUNT ABSOLUTE: 175.9 10*3/uL (ref 19.0–186.0)
Retic Ct Pct: 3.9 % — ABNORMAL HIGH (ref 0.4–3.1)

## 2018-03-15 LAB — LACTIC ACID, PLASMA
LACTIC ACID, VENOUS: 3.8 mmol/L — AB (ref 0.5–1.9)
Lactic Acid, Venous: 3.4 mmol/L (ref 0.5–1.9)

## 2018-03-15 LAB — I-STAT VENOUS BLOOD GAS, ED
Acid-base deficit: 6 mmol/L — ABNORMAL HIGH (ref 0.0–2.0)
Bicarbonate: 19.4 mmol/L — ABNORMAL LOW (ref 20.0–28.0)
O2 SAT: 72 %
PCO2 VEN: 34.8 mmHg — AB (ref 44.0–60.0)
PH VEN: 7.35 (ref 7.250–7.430)
Patient temperature: 97.4
TCO2: 20 mmol/L — AB (ref 22–32)
pO2, Ven: 38 mmHg (ref 32.0–45.0)

## 2018-03-15 LAB — BASIC METABOLIC PANEL
ANION GAP: 12 (ref 5–15)
BUN: 27 mg/dL — ABNORMAL HIGH (ref 8–23)
CALCIUM: 8.9 mg/dL (ref 8.9–10.3)
CO2: 20 mmol/L — AB (ref 22–32)
Chloride: 107 mmol/L (ref 98–111)
Creatinine, Ser: 1.15 mg/dL — ABNORMAL HIGH (ref 0.44–1.00)
GFR calc non Af Amer: 50 mL/min — ABNORMAL LOW (ref >60)
GFR, EST AFRICAN AMERICAN: 58 mL/min — AB (ref >60)
Glucose, Bld: 144 mg/dL — ABNORMAL HIGH (ref 70–99)
Potassium: 3.9 mmol/L (ref 3.5–5.1)
SODIUM: 139 mmol/L (ref 135–145)

## 2018-03-15 LAB — PROCALCITONIN: Procalcitonin: 39.08 ng/mL

## 2018-03-15 LAB — MAGNESIUM: Magnesium: 1.4 mg/dL — ABNORMAL LOW (ref 1.7–2.4)

## 2018-03-15 LAB — I-STAT CG4 LACTIC ACID, ED: Lactic Acid, Venous: 5 mmol/L (ref 0.5–1.9)

## 2018-03-15 LAB — MRSA PCR SCREENING: MRSA by PCR: NEGATIVE

## 2018-03-15 LAB — PHOSPHORUS: Phosphorus: 3.5 mg/dL (ref 2.5–4.6)

## 2018-03-15 MED ORDER — METOPROLOL TARTRATE 5 MG/5ML IV SOLN
2.5000 mg | Freq: Once | INTRAVENOUS | Status: AC
  Administered 2018-03-15: 2.5 mg via INTRAVENOUS
  Filled 2018-03-15: qty 5

## 2018-03-15 MED ORDER — SODIUM CHLORIDE 0.9 % IV SOLN: 250.0000 mL | INTRAVENOUS | Status: DC | PRN

## 2018-03-15 MED ORDER — ACETAMINOPHEN 650 MG RE SUPP: 650.0000 mg | Freq: Four times a day (QID) | RECTAL | Status: DC | PRN

## 2018-03-15 MED ORDER — PROPRANOLOL HCL 20 MG PO TABS
20.0000 mg | ORAL_TABLET | Freq: Three times a day (TID) | ORAL | Status: DC
  Administered 2018-03-15 – 2018-03-17 (×6): 20 mg via ORAL
  Filled 2018-03-15 (×8): qty 1

## 2018-03-15 MED ORDER — DILTIAZEM HCL 30 MG PO TABS: 30.0000 mg | ORAL_TABLET | Freq: Four times a day (QID) | ORAL | Status: DC

## 2018-03-15 MED ORDER — FENTANYL CITRATE (PF) 100 MCG/2ML IJ SOLN
12.5000 ug | INTRAMUSCULAR | Status: DC | PRN
  Administered 2018-03-15 (×2): 25 ug via INTRAVENOUS
  Administered 2018-03-15: 12.5 ug via INTRAVENOUS
  Administered 2018-03-16 (×2): 25 ug via INTRAVENOUS
  Administered 2018-03-17 (×2): 12.5 ug via INTRAVENOUS
  Administered 2018-03-18 (×2): 25 ug via INTRAVENOUS
  Filled 2018-03-15 (×9): qty 2

## 2018-03-15 MED ORDER — IOPAMIDOL (ISOVUE-300) INJECTION 61%
100.0000 mL | Freq: Once | INTRAVENOUS | Status: AC | PRN
  Administered 2018-03-20: 100 mL via INTRAVENOUS

## 2018-03-15 MED ORDER — DILTIAZEM HCL-DEXTROSE 100-5 MG/100ML-% IV SOLN (PREMIX)
5.0000 mg/h | INTRAVENOUS | Status: DC
Start: 2018-03-15 — End: 2018-03-16
  Administered 2018-03-15: 5 mg/h via INTRAVENOUS
  Administered 2018-03-15 – 2018-03-16 (×3): 15 mg/h via INTRAVENOUS
  Filled 2018-03-15 (×5): qty 100

## 2018-03-15 MED ORDER — ONDANSETRON HCL 4 MG/2ML IJ SOLN
4.0000 mg | Freq: Four times a day (QID) | INTRAMUSCULAR | Status: DC | PRN
  Administered 2018-03-16: 4 mg via INTRAVENOUS
  Filled 2018-03-15: qty 2

## 2018-03-15 MED ORDER — PIPERACILLIN-TAZOBACTAM 3.375 G IVPB
3.3750 g | Freq: Three times a day (TID) | INTRAVENOUS | Status: DC
  Administered 2018-03-15 – 2018-03-18 (×11): 3.375 g via INTRAVENOUS
  Filled 2018-03-15 (×11): qty 50

## 2018-03-15 MED ORDER — SODIUM CHLORIDE 0.9 % IV BOLUS
500.0000 mL | Freq: Once | INTRAVENOUS | Status: AC
  Administered 2018-03-15: 500 mL via INTRAVENOUS

## 2018-03-15 MED ORDER — ALBUMIN HUMAN 25 % IV SOLN
INTRAVENOUS | Status: AC
  Filled 2018-03-15: qty 50

## 2018-03-15 MED ORDER — ONDANSETRON HCL 4 MG/2ML IJ SOLN: 4.0000 mg | Freq: Four times a day (QID) | INTRAMUSCULAR | Status: DC | PRN

## 2018-03-15 MED ORDER — OXYCODONE HCL 5 MG PO TABS
5.0000 mg | ORAL_TABLET | Freq: Four times a day (QID) | ORAL | Status: DC | PRN
  Administered 2018-03-15 – 2018-03-21 (×9): 5 mg via ORAL
  Filled 2018-03-15 (×10): qty 1

## 2018-03-15 MED ORDER — MAGNESIUM SULFATE 2 GM/50ML IV SOLN
2.0000 g | Freq: Once | INTRAVENOUS | Status: AC
  Administered 2018-03-15: 2 g via INTRAVENOUS
  Filled 2018-03-15: qty 50

## 2018-03-15 MED ORDER — SODIUM CHLORIDE 0.9 % IV SOLN
INTRAVENOUS | Status: DC
  Administered 2018-03-15 – 2018-03-17 (×3): via INTRAVENOUS

## 2018-03-15 MED ORDER — PANTOPRAZOLE SODIUM 40 MG PO TBEC
40.0000 mg | DELAYED_RELEASE_TABLET | Freq: Every day | ORAL | Status: DC
  Administered 2018-03-15 – 2018-03-23 (×8): 40 mg via ORAL
  Filled 2018-03-15 (×9): qty 1

## 2018-03-15 MED ORDER — INSULIN ASPART 100 UNIT/ML ~~LOC~~ SOLN
1.0000 [IU] | SUBCUTANEOUS | Status: DC
  Administered 2018-03-15 (×2): 1 [IU] via SUBCUTANEOUS

## 2018-03-15 MED ORDER — INSULIN ASPART 100 UNIT/ML ~~LOC~~ SOLN
0.0000 [IU] | Freq: Three times a day (TID) | SUBCUTANEOUS | Status: DC
  Administered 2018-03-15: 4 [IU] via SUBCUTANEOUS
  Administered 2018-03-16: 3 [IU] via SUBCUTANEOUS
  Administered 2018-03-16: 4 [IU] via SUBCUTANEOUS
  Administered 2018-03-16 – 2018-03-19 (×7): 3 [IU] via SUBCUTANEOUS
  Administered 2018-03-20: 4 [IU] via SUBCUTANEOUS
  Administered 2018-03-20 – 2018-03-21 (×3): 3 [IU] via SUBCUTANEOUS
  Administered 2018-03-22: 4 [IU] via SUBCUTANEOUS
  Administered 2018-03-22: 3 [IU] via SUBCUTANEOUS

## 2018-03-15 MED ORDER — POTASSIUM CHLORIDE 10 MEQ/100ML IV SOLN
10.0000 meq | INTRAVENOUS | Status: DC
  Administered 2018-03-15: 10 meq via INTRAVENOUS
  Filled 2018-03-15: qty 100

## 2018-03-15 MED ORDER — SODIUM CHLORIDE 0.9 % IV SOLN
INTRAVENOUS | Status: DC
  Administered 2018-03-15: 1000 mL via INTRAVENOUS
  Administered 2018-03-16: 250 mL via INTRAVENOUS

## 2018-03-15 MED ORDER — VANCOMYCIN HCL 10 G IV SOLR
1500.0000 mg | INTRAVENOUS | Status: DC
  Administered 2018-03-15 – 2018-03-17 (×3): 1500 mg via INTRAVENOUS
  Filled 2018-03-15 (×6): qty 1500

## 2018-03-15 MED ORDER — DILTIAZEM LOAD VIA INFUSION
10.0000 mg | Freq: Once | INTRAVENOUS | Status: AC
  Administered 2018-03-15: 10 mg via INTRAVENOUS
  Filled 2018-03-15: qty 10

## 2018-03-15 MED ORDER — INSULIN ASPART 100 UNIT/ML ~~LOC~~ SOLN: 0.0000 [IU] | Freq: Every day | SUBCUTANEOUS | Status: DC

## 2018-03-15 MED ORDER — FENTANYL CITRATE (PF) 100 MCG/2ML IJ SOLN
50.0000 ug | Freq: Once | INTRAMUSCULAR | Status: AC
  Administered 2018-03-15: 50 ug via INTRAVENOUS
  Filled 2018-03-15: qty 2

## 2018-03-15 MED ORDER — ACETAMINOPHEN 325 MG PO TABS
650.0000 mg | ORAL_TABLET | Freq: Four times a day (QID) | ORAL | Status: DC | PRN
  Administered 2018-03-15 – 2018-03-21 (×4): 650 mg via ORAL
  Filled 2018-03-15 (×5): qty 2

## 2018-03-15 MED ORDER — SENNA 8.6 MG PO TABS
1.0000 | ORAL_TABLET | Freq: Every day | ORAL | Status: DC
  Administered 2018-03-16 – 2018-03-22 (×6): 8.6 mg via ORAL
  Filled 2018-03-15 (×6): qty 1

## 2018-03-15 MED ORDER — ALBUMIN HUMAN 25 % IV SOLN
25.0000 g | Freq: Once | INTRAVENOUS | Status: AC
  Administered 2018-03-15: 25 g via INTRAVENOUS
  Filled 2018-03-15: qty 50

## 2018-03-15 MED ORDER — NYSTATIN 100000 UNIT/GM EX POWD
Freq: Two times a day (BID) | CUTANEOUS | Status: DC
  Administered 2018-03-15 (×3): via TOPICAL
  Administered 2018-03-16: 1 via TOPICAL
  Administered 2018-03-16 – 2018-03-23 (×12): via TOPICAL
  Filled 2018-03-15 (×2): qty 15

## 2018-03-15 MED ORDER — POTASSIUM CHLORIDE CRYS ER 20 MEQ PO TBCR
20.0000 meq | EXTENDED_RELEASE_TABLET | Freq: Once | ORAL | Status: AC
  Administered 2018-03-15: 20 meq via ORAL
  Filled 2018-03-15: qty 1

## 2018-03-15 MED ORDER — ALBUTEROL SULFATE (2.5 MG/3ML) 0.083% IN NEBU
2.5000 mg | INHALATION_SOLUTION | RESPIRATORY_TRACT | Status: DC | PRN
  Administered 2018-03-17 – 2018-03-18 (×4): 2.5 mg via RESPIRATORY_TRACT
  Filled 2018-03-15 (×5): qty 3

## 2018-03-15 MED ORDER — DOCUSATE SODIUM 100 MG PO CAPS
100.0000 mg | ORAL_CAPSULE | Freq: Two times a day (BID) | ORAL | Status: DC
  Administered 2018-03-15 – 2018-03-23 (×15): 100 mg via ORAL
  Filled 2018-03-15 (×15): qty 1

## 2018-03-15 MED ORDER — ONDANSETRON HCL 4 MG PO TABS: 4.0000 mg | ORAL_TABLET | Freq: Four times a day (QID) | ORAL | Status: DC | PRN

## 2018-03-15 NOTE — Progress Notes (Signed)
[facility] Progress Note Patient Name: Hannah Khan DOB: [birth year] MRN: [id number]   Date of Service  03/15/2018  HPI/Events of Note  Persistent afib with RVR (rate 127)  eICU Interventions  Metoprolol 2.5 mg iv x 1        [place]or[place]wo U Ogan 03/15/2018, 6:19 AM

## 2018-03-15 NOTE — Consult Note (Signed)
PULMONARY / CRITICAL CARE MEDICINE   Name: Hannah Khan MRN: [id number] DOB: 1956/04/27    ADMISSION DATE:  03/15/18 CONSULTATION DATE: 03/15/18  REFERRING MD: Transfer from [place]: Cellulitis  HISTORY OF PRESENT ILLNESS:   62yoF with hx Afib, DM, HTN, ITP, NASH cirrhosis, Morbid obesity, and OSA, presented on 6/26 PM to the [facility] ER c/o N/V, Fever (up to 100.5), and Pain/Swelling in her RLE x 1 day. She was found there to have severe sepsis with lactate of 5.0. She was given 2.5L IVF as well as Vancomycin and Zosyn. She was transferred to [facility] ICU under hospitalist service, and PCCM consult was requested. On my interview of her, she also c/o SOB which she says is chronic for her but worse than usual and started prior to presenting to the outside hospital (aka prior to getting IVF boluses). Denies Cough, CP, Abd pain, Diarrhea. Reports constipation.   PAST MEDICAL HISTORY :  She  has a past medical history of Anemia, Arthritis, Atrial fibrillation ([place]), Blood dyscrasia, Diabetes mellitus without complication ([place]), Dysrhythmia, Fibromyalgia, HTN (hypertension), colonic polyps, Hyperlipidemia, ITP (idiopathic thrombocytopenic purpura), Joint pain, Left leg swelling, Liver cirrhosis secondary to NASH (nonalcoholic steatohepatitis) ([place]) (dx nov 2014), Low blood pressure, Morbid obesity ([place]), Pneumonia (feb 2018 weel now), PONV (postoperative nausea and vomiting), Portal hypertension ([place]), Shortness of breath dyspnea, Sleep apnea, Sore on scalp, and Spleen enlarged.  PAST SURGICAL HISTORY: She  has a past surgical history that includes Knee arthroscopy (yrs ago); Cholecystectomy (20 yrs ago); Cesarean section (1989); Esophagogastroduodenoscopy (egd) with propofol (N/A, 09/04/2013); Dilatation & curettage/hysteroscopy with myosure (N/A, 05/06/2016); Colectomy (2011); Colonoscopy; and Breast lumpectomy with radioactive seed localization (Right,  08/29/2017).  Allergies  Allergen Reactions  . Celecoxib Other (See Comments)    "speech slurred" with celebrex  . Shellfish Allergy Swelling and Rash    TINGLING AND SWELLING OF LIPS. LARGE WHELPS QUARTER-SIZE  . Barium-Containing Compounds Other (See Comments)    TACHYCARDIA  . Prednisone Other (See Comments)    TACHYCARDIA  . Statins Other (See Comments)    AGGRAVATED FIBROMYALGIA  . Ibuprofen Other (See Comments)    UNSPECIFIED SPECIFIC REACTION >> "DUE TO PLATELETS"  . Tylenol [Acetaminophen] Other (See Comments)    UNSPECIFIED SPECIFIC REACTION >> "DUE TO PLATELETS"    No current facility-administered medications on file prior to encounter.    Current Outpatient Medications on File Prior to Encounter  Medication Sig  . bumetanide (BUMEX) 2 MG tablet Take 2-4 mg by mouth See admin instructions. Take 4 mgs by mouth once daily, may take an additional 74m as needed for swelling  . calcium carbonate (OS-CAL - DOSED IN MG OF ELEMENTAL CALCIUM) 1250 (500 Ca) MG tablet Take 1 tablet by mouth.  . cyclobenzaprine (FLEXERIL) 10 MG tablet Take 10 mg by mouth 3 (three) times daily as needed for muscle spasms.  .Marland Kitchendiltiazem (CARDIZEM CD) 240 MG 24 hr capsule Take 1 capsule (240 mg total) by mouth daily.  . ergocalciferol (VITAMIN D2) 50000 units capsule Take 50,000 Units by mouth 2 (two) times a week. On Wednesday and Saturday  . JARDIANCE 25 MG TABS tablet Take 25 mg by mouth daily.  .Marland KitchenoxyCODONE-acetaminophen (PERCOCET/ROXICET) 5-325 MG tablet Take 1 tablet by mouth every 8 (eight) hours as needed for severe pain.  .Marland Kitchenpropranolol (INDERAL) 10 MG tablet TAKE 1 TABLET BY MOUTH TWICE A DAY  . spironolactone (ALDACTONE) 25 MG tablet Take 25 mg by mouth  daily.   . Capsaicin-Menthol (SALONPAS GEL EX) Apply 1 application topically as needed (for pain).  Marland Kitchen KLOR-CON M20 20 MEQ tablet Take 20 mEq by mouth daily.   . methocarbamol (ROBAXIN) 500 MG tablet Take 500-750 mg by mouth daily as needed for  muscle spasms.   Marland Kitchen oxyCODONE (OXY IR/ROXICODONE) 5 MG immediate release tablet Take 1-2 tablets (5-10 mg total) by mouth every 6 (six) hours as needed for severe pain. (Patient not taking: Reported on 09/28/2017)  . oxyCODONE (ROXICODONE) 5 MG immediate release tablet Take 1 tablet (5 mg total) by mouth every 6 (six) hours as needed for severe pain. (Patient not taking: Reported on 03/15/2018)   FAMILY HISTORY:  Her indicated that her mother is deceased. She indicated that her father is deceased. She indicated that the status of her brother is unknown. She indicated that her maternal grandmother is deceased. She indicated that her maternal grandfather is deceased. She indicated that her paternal grandmother is deceased. She indicated that her paternal grandfather is deceased. She indicated that the status of her other is unknown.  SOCIAL HISTORY: She  reports that she quit smoking about 43 years ago. Her smoking use included cigarettes. She has a 3.00 pack-year smoking history. She has never used smokeless tobacco. She reports that she drinks alcohol. She reports that she does not use drugs.  REVIEW OF SYSTEMS:   Review of Systems  Constitutional: Positive for chills and fever.  HENT: Negative.   Eyes: Negative.   Respiratory: Positive for shortness of breath. Negative for cough and wheezing.   Cardiovascular: Positive for orthopnea and leg swelling. Negative for chest pain.  Gastrointestinal: Positive for constipation, nausea and vomiting. Negative for abdominal pain and diarrhea.  Genitourinary: Negative.   Musculoskeletal: Negative.   Skin: Positive for rash.  Neurological: Negative.   Endo/Heme/Allergies: Negative.   Psychiatric/Behavioral: Negative.    SUBJECTIVE:  Lying in ICU bed in NAD  VITAL SIGNS: BP (!) 113/55   Pulse (!) 118   Temp 98.2 F (36.8 C)   Resp (!) 21   Ht 5' 7"  (1.702 m)   Wt (!) 162.9 kg (359 lb 2.1 oz)   SpO2 97%   BMI 56.25 kg/m   HEMODYNAMICS:  no  vasopressors  INTAKE / OUTPUT: No intake/output data recorded.  PHYSICAL EXAMINATION: General: Morbidly obese adult female, lying in ICU bed wearing 2L O2, in NAD Neuro: Awake/alert, oriented x 3, moving all extremities, obeying commands. Talkative. PERRL HEENT: OP clear, MM dry Cardiovascular: Irreg Irreg at 110's Lungs: CTA b/l, anteriorly and laterally; speaking in full sentences, no accessory muscle use, Mildly tachypneic RR 20-24.  Abdomen: Morbidly obese, soft Nontender, BS hypoactive. Large panus with yeasty rash in inguinal folds bilaterally Musculoskeletal: no LE edema  Skin: Erythema and increased warmth and tenderness in right inner upper thigh, as well as right lower leg (anterior and lateral worse); areas have been demarcated with a surgical skin marker  LABS:  BMET Recent Labs  Lab 03/14/18 2231  NA 134*  K 3.5  CL 102  CO2 19*  BUN 25*  CREATININE 1.15*  GLUCOSE 164*   Electrolytes Recent Labs  Lab 03/14/18 2231  CALCIUM 8.9   CBC Recent Labs  Lab 03/14/18 2231  WBC 12.6*  HGB 14.2  HCT 40.9  PLT 32*   Coag's No results for input(s): APTT, INR in the last 168 hours.  Sepsis Markers Recent Labs  Lab 03/14/18 2244 03/15/18 0022  LATICACIDVEN 4.76* 5.00*   ABG  No results for input(s): PHART, PCO2ART, PO2ART in the last 168 hours.  Liver Enzymes Recent Labs  Lab 03/14/18 2231  AST 59*  ALT 25  ALKPHOS 78  BILITOT 5.0*  ALBUMIN 3.3*   Cardiac Enzymes No results for input(s): TROPONINI, PROBNP in the last 168 hours.  Glucose No results for input(s): GLUCAP in the last 168 hours.  Imaging Dg Chest 2 View  Result Date: 03/14/2018 CLINICAL DATA:  62 year old female with fever and vomiting. EXAM: CHEST - 2 VIEW COMPARISON:  Chest radiograph dated 10/31/2016 FINDINGS: There is cardiomegaly with mild vascular congestion and a small pleural effusion. Superimposed pneumonia is not excluded. There is no focal consolidation or pneumothorax.  No acute osseous pathology. IMPRESSION: Cardiomegaly findings of CHF. Pneumonia is not excluded. Clinical correlation is recommended. Electronically Signed   By: Anner Crete M.D.   On: 03/14/2018 23:58   Dg Tibia/fibula Right  Result Date: 03/14/2018 CLINICAL DATA:  Swelling and pain in the right lower extremity. EXAM: RIGHT TIBIA AND FIBULA - 2 VIEW COMPARISON:  None. FINDINGS: Osteoarthritis of the right knee with medial femorotibial joint space narrowing and spurring of the tibial spines and tibial plateau. Additionally there is patellofemoral joint space narrowing spurring. No joint effusion is noted. The ankle mortise and included subtalar joints are unremarkable. There is mild soft tissue induration of the leg compatible soft tissue edema. IMPRESSION: Mild diffuse soft tissue edema of the right leg. Osteoarthritis of the right knee. No acute osseous abnormality. Electronically Signed   By: Ashley Royalty M.D.   On: 03/14/2018 23:59   CULTURES: Blood cultures 6/27: pending  ANTIBIOTICS: Vancomycin 6/27>> Zosyn 6/27>>  SIGNIFICANT EVENTS: 6/26 PM: presented to [facility] ER with N/V, Fever, and RLE cellulitis, diagnosed with severe sepsis and transferred to [facility] ICU  LINES/TUBES: 1-20G PIV  DISCUSSION: 62yoF with hx Afib, DM, HTN, ITP, NASH cirrhosis, Morbid obesity, and OSA, presented on 6/26 PM to the [facility] ER c/o N/V, Fever, and Pain/Swelling in her RLE x 1 day and was found to be in severe sepsis due to RLE Cellulitis. She is now transferred to the [facility] ICU for further care.   ASSESSMENT / PLAN:  PULMONARY 1. OSA - CPAP 14cmH2O qHS and with naps   CARDIOVASCULAR 1. Afib; HTN - HR initially 124, improved to 118 after IVF's; is on diltiazem 252m daily at home. Will hold this for now given her soft BP. Can decide in AM to restart it or possibly due short acting dilt instead so that it could be more quickly stopped if she did become hypotensive after first dose.  - does not appear  she is on anticoag for her Afib at home, presumably due to her ITP - hold home BP medicines given her current sepsis  RENAL 1. AKI; Hypokalemia - creatinine 1.15 up from baseline of 0.93 in 08/2017; given IVF boluses. Continue gentle IVF infusion. Monitor UOP. Avoid nephrotoxic agents. Obtain UA - Potassium 3.5; replete with 40MEQ KCL  GASTROINTESTINAL 1. N/V - NPO; Zofran PRN  2. NASH cirrhosis; Bilirubinemia: - MAP goal > 55 given the history of cirrhosis - Tbili 5.0 up from baseline in 2's on prior checks; likely chronically high given her cirrhosis and acutely worsened in setting of sepsis. However, will obtain RUQ UKoreato rule out acute cholecystitis  HEMATOLOGIC 1. ITP  - continue to monitor  INFECTIOUS 1. Severe sepsis due to RLE cellulitis - BP 103/59 at outside hospital, improved to 113/55 after 2.5L IVF bolus given;  will hold off on bolusing more IVF at this time as patient has subjective complaints of SOB but is in no respiratory distress objectively on exam. If lactate continues to climb, will need to revisit possibly giving more IVF's - MAP goal >55 given the history of cirrhosis - Lactate climbed from 4.76 to 5.0 even after the IVF boluses; part of this may be delayed clearance given her cirrhosis; will continue to trend lactate - Check Procalcitonin, Cortisol, MRSA nares - appears only 1 culture was sent from outside hospital; obtain 2 sets blood cultures now and obtain UA as well. CXR on my review shows no infiltrates but mild pulmonary edema.  - Continue Vancomycin and Zosyn; review of her EMR doesn't show any prior positive cultures with MDR bacteria that would require targeting now  ENDOCRINE 1. DM - NPO; SSI  NEUROLOGIC No active issues    FAMILY  - Updates: patient's husband is present in ICU room at bedside and was updated  - Inter-disciplinary family meet or Palliative Care meeting due by: 03/21/18  60 minutes nonprocedural critical care time  Vernie Murders, MD  Pulmonary and [place] Pager: [phone]  03/15/2018, 2:18 AM

## 2018-03-15 NOTE — ED Notes (Signed)
Pt transported to CT ?

## 2018-03-15 NOTE — Progress Notes (Signed)
[facility] Progress Note Patient Name: Hannah Khan DOB: 1955/11/29 MRN: [id number]   Date of Service  03/15/2018  HPI/Events of Note  Sinus tachycardia and elevated lactate in the context of 2.5 liters of crystalloid for septic shock. Pt. Lung vascular markings slightly more prominent.  eICU Interventions  25 % Albumin 25 gm iv x 1 then interval recheck of Lactic acid level.        Kerry Kass Margurite Duffy 03/15/2018, 6:02 AM

## 2018-03-15 NOTE — H&P (Signed)
History and Physical    Hannah Khan ION:[id number] DOB: 1955/12/06 DOA: 03/14/2018  PCP: Lujean Amel, MD  Patient coming from: Home  I have personally briefly reviewed patient's old medical records in [place]  Chief Complaint: SOB, RLE pain  HPI: Hannah Khan is a 62 y.o. female with medical history significant of NASH cirrhosis, HTN, DM2, A.Fib not on anticoagulants due to chronic thrombocytopenia from ITP, morbid obesity.  Patient presents to the ED at [facility] with c/o N/V, RLE pain, erythema.  Associated SOB, fever 101 at home.  Tylenol provided minimal relief.  Has had cellulitis before, has had severe illness (sepsis) with cellulitis before some years ago).  Symptoms onset this morning, rapidly worsened and so she presents to ED.   ED Course: RLE cellulitis, WBC 12.6k, Platelets 32 (baseline for her actually), HGB 14.2, Tbili 5 up from 2.5 baseline, lactate 4.7, given 2L IVF and increased to 5.0, given 500cc further IVF.  VBG seems to confirm metabolic acidosis with respiratory compensation.    Review of Systems: As per HPI otherwise 10 point review of systems negative.   Past Medical History:  Diagnosis Date  . Anemia    hx of  . Arthritis    knees  . Atrial fibrillation ([place])   . Blood dyscrasia   . Diabetes mellitus without complication ([place])    type 2  . Dysrhythmia    a fib  . Fibromyalgia   . HTN (hypertension)   . Hx of colonic polyps    s/p partial colectomy  . Hyperlipidemia   . ITP (idiopathic thrombocytopenic purpura)   . Joint pain    in knees and back spasms  . Left leg swelling    wear compression hose  . Liver cirrhosis secondary to NASH (nonalcoholic steatohepatitis) ([place]) dx nov 2014   had enlarged spleen also   . Low blood pressure    bp varies  . Morbid obesity ([place])   . Pneumonia feb 2018 weel now  . PONV (postoperative nausea and vomiting)    in past none recent  . Portal hypertension ([place])   . Shortness of breath dyspnea      history of with fluid overload takes diuretics improved now, with exertion  . Sleep apnea    uses cpap setting of 14  . Sore on scalp    may bleed secondary to ITP comes and goes none now  . Spleen enlarged     Past Surgical History:  Procedure Laterality Date  . BREAST LUMPECTOMY WITH RADIOACTIVE SEED LOCALIZATION Right 08/29/2017   Procedure: RIGHT BREAST LUMPECTOMY WITH RADIOACTIVE SEEDS LOCALIZATION;  Surgeon: Erroll Luna, MD;  Location: [facility];  Service: General;  Laterality: Right;  . CESAREAN SECTION  1989  . CHOLECYSTECTOMY  20 yrs ago  . COLECTOMY  2011  . COLONOSCOPY    . DILATATION & CURETTAGE/HYSTEROSCOPY WITH MYOSURE N/A 05/06/2016   Procedure: DILATATION & CURETTAGE/HYSTEROSCOPY WITH MYOSURE WITH POLYPECTOMY;  Surgeon: Janyth Pupa, DO;  Location: [facility];  Service: Gynecology;  Laterality: N/A;  . ESOPHAGOGASTRODUODENOSCOPY (EGD) WITH PROPOFOL N/A 09/04/2013   Procedure: ESOPHAGOGASTRODUODENOSCOPY (EGD) WITH PROPOFOL;  Surgeon: [name], MD;  Location: [facility];  Service: Endoscopy;  Laterality: N/A;  . KNEE ARTHROSCOPY  yrs ago   bilateral, one done x 1, one done twice     reports that she quit smoking about 43 years ago. Her smoking use included cigarettes. She has a 3.00 pack-year smoking history. She has never used smokeless tobacco.  She reports that she drinks alcohol. She reports that she does not use drugs.  Allergies  Allergen Reactions  . Celecoxib Other (See Comments)    "speech slurred" with celebrex  . Shellfish Allergy Swelling and Rash    TINGLING AND SWELLING OF LIPS. LARGE WHELPS QUARTER-SIZE  . Barium-Containing Compounds Other (See Comments)    TACHYCARDIA  . Prednisone Other (See Comments)    TACHYCARDIA  . Statins Other (See Comments)    AGGRAVATED FIBROMYALGIA  . Ibuprofen Other (See Comments)    UNSPECIFIED SPECIFIC REACTION >> "DUE TO PLATELETS"  . Tylenol [Acetaminophen] Other (See Comments)    UNSPECIFIED SPECIFIC  REACTION >> "DUE TO PLATELETS"    Family History  Problem Relation Age of Onset  . Heart failure Mother   . Cancer Mother   . Hyperlipidemia Mother   . Congestive Heart Failure Mother   . Stroke Mother   . Lung cancer Father   . Cancer Father   . Cancer Brother   . Hyperlipidemia Brother   . Stroke Other      Prior to Admission medications   Medication Sig Start Date End Date Taking? Authorizing Provider  bumetanide (BUMEX) 2 MG tablet Take 2-4 mg by mouth See admin instructions. Take 4 mgs by mouth once daily, may take an additional 47m as needed for swelling   Yes [provider]  calcium carbonate (OS-CAL - DOSED IN MG OF ELEMENTAL CALCIUM) 1250 (500 Ca) MG tablet Take 1 tablet by mouth.   Yes [provider]  cyclobenzaprine (FLEXERIL) 10 MG tablet Take 10 mg by mouth 3 (three) times daily as needed for muscle spasms.   Yes [provider]  diltiazem (CARDIZEM CD) 240 MG 24 hr capsule Take 1 capsule (240 mg total) by mouth daily. 07/11/17  Yes RSkeet Latch MD  ergocalciferol (VITAMIN D2) 50000 units capsule Take 50,000 Units by mouth 2 (two) times a week. On Wednesday and Saturday   Yes [provider]  JARDIANCE 25 MG TABS tablet Take 25 mg by mouth daily. 01/28/17  Yes [provider]  oxyCODONE-acetaminophen (PERCOCET/ROXICET) 5-325 MG tablet Take 1 tablet by mouth every 8 (eight) hours as needed for severe pain.   Yes [provider]  propranolol (INDERAL) 10 MG tablet TAKE 1 TABLET BY MOUTH TWICE A DAY 03/09/18  Yes RSkeet Latch MD  spironolactone (ALDACTONE) 25 MG tablet Take 25 mg by mouth daily.  07/24/13  Yes SThurnell Lose MD  Capsaicin-Menthol (SALONPAS GEL EX) Apply 1 application topically as needed (for pain).    [provider]  KLOR-CON M20 20 MEQ tablet Take 20 mEq by mouth daily.  03/23/17   [provider]  methocarbamol (ROBAXIN) 500 MG tablet Take 500-750 mg by mouth daily as  needed for muscle spasms.     [provider]  oxyCODONE (OXY IR/ROXICODONE) 5 MG immediate release tablet Take 1-2 tablets (5-10 mg total) by mouth every 6 (six) hours as needed for severe pain. Patient not taking: Reported on 09/28/2017 08/29/17   CErroll Luna MD  oxyCODONE (ROXICODONE) 5 MG immediate release tablet Take 1 tablet (5 mg total) by mouth every 6 (six) hours as needed for severe pain. Patient not taking: Reported on 03/15/2018 09/28/17   SEtta Quill NP    Physical Exam: Vitals:   03/15/18 0055 03/15/18 0100 03/15/18 0115 03/15/18 0130  BP: (!) 98/56 (!) 98/49 (!) 100/52 (!) 113/55  Pulse: (!) 115 (!) 118 (!) 120 (!) 118  Resp: (Marland Kitchen  30 (!) 23 (!) 21 (!) 21  Temp:      TempSrc:      SpO2: 95% 94% 96% 97%  Weight:      Height:        Constitutional: Tachypnic Eyes: PERRL, lids and conjunctivae normal ENMT: Mucous membranes are moist. Posterior pharynx clear of any exudate or lesions.Normal dentition.  Neck: normal, supple, no masses, no thyromegaly Respiratory: clear to auscultation bilaterally, Tachypnic Cardiovascular: Regular rate and rhythm, no murmurs / rubs / gallops. No extremity edema. 2+ pedal pulses. No carotid bruits.  Abdomen: no tenderness, no masses palpated. No hepatosplenomegaly. Bowel sounds positive.  Musculoskeletal: no clubbing / cyanosis. No joint deformity upper and lower extremities. Good ROM, no contractures. Normal muscle tone.  Skin: RLE erythema, warmth, tenderness.  No subq air palpable.   Neurologic: CN 2-12 grossly intact. Sensation intact, DTR normal. Strength 5/5 in all 4.  Psychiatric: Normal judgment and insight. Alert and oriented x 3. Normal mood.    Labs on Admission: I have personally reviewed following labs and imaging studies  CBC: Recent Labs  Lab 03/14/18 2231  WBC 12.6*  NEUTROABS 11.4*  HGB 14.2  HCT 40.9  MCV 89.3  PLT 32*   Basic Metabolic Panel: Recent Labs  Lab 03/14/18 2231  NA 134*  K 3.5  CL  102  CO2 19*  GLUCOSE 164*  BUN 25*  CREATININE 1.15*  CALCIUM 8.9   GFR: Estimated Creatinine Clearance: 81.8 mL/min (A) (by C-G formula based on SCr of 1.15 mg/dL (H)). Liver Function Tests: Recent Labs  Lab 03/14/18 2231  AST 59*  ALT 25  ALKPHOS 78  BILITOT 5.0*  PROT 6.7  ALBUMIN 3.3*   No results for input(s): LIPASE, AMYLASE in the last 168 hours. No results for input(s): AMMONIA in the last 168 hours. Coagulation Profile: No results for input(s): INR, PROTIME in the last 168 hours. Cardiac Enzymes: No results for input(s): CKTOTAL, CKMB, CKMBINDEX, TROPONINI in the last 168 hours. BNP (last 3 results) No results for input(s): PROBNP in the last 8760 hours. HbA1C: No results for input(s): HGBA1C in the last 72 hours. CBG: No results for input(s): GLUCAP in the last 168 hours. Lipid Profile: No results for input(s): CHOL, HDL, LDLCALC, TRIG, CHOLHDL, LDLDIRECT in the last 72 hours. Thyroid Function Tests: No results for input(s): TSH, T4TOTAL, FREET4, T3FREE, THYROIDAB in the last 72 hours. Anemia Panel: No results for input(s): VITAMINB12, FOLATE, FERRITIN, TIBC, IRON, RETICCTPCT in the last 72 hours. Urine analysis:    Component Value Date/Time   COLORURINE AMBER (A) 07/10/2016 1400   APPEARANCEUR CLEAR 07/10/2016 1400   LABSPEC 1.030 07/10/2016 1400   PHURINE 6.0 07/10/2016 1400   GLUCOSEU NEGATIVE 07/10/2016 1400   HGBUR TRACE (A) 07/10/2016 1400   BILIRUBINUR NEGATIVE 07/10/2016 1400   KETONESUR NEGATIVE 07/10/2016 1400   PROTEINUR NEGATIVE 07/10/2016 1400   UROBILINOGEN 2.0 (H) 01/31/2015 0055   NITRITE NEGATIVE 07/10/2016 1400   LEUKOCYTESUR NEGATIVE 07/10/2016 1400    Radiological Exams on Admission: Dg Chest 2 View  Result Date: 03/14/2018 CLINICAL DATA:  62 year old female with fever and vomiting. EXAM: CHEST - 2 VIEW COMPARISON:  Chest radiograph dated 10/31/2016 FINDINGS: There is cardiomegaly with mild vascular congestion and a small  pleural effusion. Superimposed pneumonia is not excluded. There is no focal consolidation or pneumothorax. No acute osseous pathology. IMPRESSION: Cardiomegaly findings of CHF. Pneumonia is not excluded. Clinical correlation is recommended. Electronically Signed   By: Laren Everts.D.  On: 03/14/2018 23:58   Dg Tibia/fibula Right  Result Date: 03/14/2018 CLINICAL DATA:  Swelling and pain in the right lower extremity. EXAM: RIGHT TIBIA AND FIBULA - 2 VIEW COMPARISON:  None. FINDINGS: Osteoarthritis of the right knee with medial femorotibial joint space narrowing and spurring of the tibial spines and tibial plateau. Additionally there is patellofemoral joint space narrowing spurring. No joint effusion is noted. The ankle mortise and included subtalar joints are unremarkable. There is mild soft tissue induration of the leg compatible soft tissue edema. IMPRESSION: Mild diffuse soft tissue edema of the right leg. Osteoarthritis of the right knee. No acute osseous abnormality. Electronically Signed   By: Ashley Royalty M.D.   On: 03/14/2018 23:59    EKG: Independently reviewed.  Assessment/Plan Principal Problem:   Severe sepsis [facility]) Active Problems:   Essential hypertension   Cirrhosis of liver, with portal HTN & Acites, by CT Abdomen 07/20/13   Permanent atrial fibrillation (HCC)   Type 2 diabetes mellitus without complication, without long-term current use of insulin (HCC)   Chronic diastolic CHF (congestive heart failure) (HCC)   Thrombocytopenia (HCC)   Cellulitis of right leg    1. Severe sepsis due to cellulitis of right leg - 1. Dr. Jimmey Ralph evaluated patient at bedside due to concern for elevating lactate in ED:  Currently believed to be due to reduced clearance of lactate secondary to known NASH cirrhosis. 2. Serial lactates 3. Zosyn / vanc 4. BCx pending 5. If lactate elevating further, let PCCM know 6. But right now Dr. Phylliss Bob wants IVF just at 75 cc/hr (2.5L bolus in ED).  Not  going for the 69m / kg right away due to cirrhosis per Dr. HJimmey Ralphinstructions. 7. Erythema outlined 1. No palpable subq air, no subq air on X ray, no pain beyond area of erythema, pain really isnt out of proportion to erythema.  Neither I nor Dr. HJimmey Ralphnor EDP believe that this represents Nec Fasciitis a this point. 2. CT was attempted but patient was unable to lay flat. 2. Cirrhosis of liver - 1. RUQ UKoreadue to bili elevation from baseline 3. Thrombocytopenia - 1. Due to ITP and/or cirrhosis - follows with Dr. SAlen Blew 2. Regardless, this is chronic and currently at baseline 3. Monitor with CBCs while inpatient 4. PAF - 1. No anticoagulants due to thrombocytopenia 2. Holding cardizem PO in setting of severe sepsis. 3. Add short acting cardizem gtt if she goes into worse RVR. 5. Chronic diastolic CHF - 1. Hold diuretics in setting of severe sepsis 2. Watch for fluid overload 6. OSA - continue home CPAP for now 7. DM2 - 1. On ICU glycemic control SSI  DVT prophylaxis: SCD of L leg (thrombocytopenia) Code Status: Full Family Communication: Husband at bedside Disposition Plan: Home after admit Consults called: PCCM Admission status: Admit to inpatient   GEtta QuillDO [facility] Pager [phone] works nights!  If 7AM-7PM, please contact the primary day team physician taking care of patient  [email or web address] Password [email or web address] 03/15/2018, 3:26 AM

## 2018-03-15 NOTE — Consult Note (Signed)
Cardiology Consultation:   Patient ID: DHWANI VENKATESH; [id number]; 1956-01-29   Admit date: 03/14/2018 Date of Consult: 03/15/2018  Primary Care Provider: Lujean Amel, MD Primary Cardiologist: Skeet Latch, MD Primary Electrophysiologist:  Dr. Rayann Heman   Patient Profile:   BEDELIA PONG is a 62 y.o. female with a PMH of atrial fibrillation not on anticoagulation due to thrombocytopenia, HTN, DM type 2, NASH cirrhosis, ITP, and morbid obesity who is being seen today for the evaluation of atrial fibrillation at the request of Dr. Lonny Prude.  History of Present Illness:   Ms. Dawn was in her usual state of health until the morning of 03/14/18 when she noticed RLE pain and erythema which progressed rapidly. She also noted associate fever to 101 at home, SOB, nausea, and vomiting. She initially presented to [facility] which these complaints and was transferred to [facility] ICU for sepsis.   She was last evaluated by cardiology outpatient 05/2017 and was thought to be doing well. She noted a brief episode of palpitations while undergoing a barium study for colonoscopy. She has chronic LE edema for which she takes bumetanide with prn does for weight changes. Otherwise she was without cardiac complaints - no chest pain, SOB, DOE, orthopnea, PND.   At the time of my evaluation, she reports feeling somewhat improved. She states yesterday morning she noticed her heart racing and SOB which she often experiences when her afib "flares up". She reports chronic orthopnea and LE edema which have not changed recently. She is without CP complaints.   Hospital course: tachycardic to the 130s, hypotensive, tachypneic, afebrile, satting well on O2 via [place].  Labs notable for K 3.5>3.9 with po supplement, Cr 1.15, Tbili 5, WBC 12.6>9.4, Hgb 14.2, PLT 24, Lactate peaked 5.0>3.4 after IVF, procal 39.08. BCx pending. EKG with afib RVR, non-ischemic, wandering baseline. CXR without acute findings. RUQ [place] without acute findings.  Patient was started on Vanc/Zosyn. IVF resuscitation continued. Cardiology asked to evaluate for Afib RVR.  Past Medical History:  Diagnosis Date  . Anemia    hx of  . Arthritis    knees  . Diabetes mellitus without complication ([place])    type 2  . Fibromyalgia   . HTN (hypertension)   . Hx of colonic polyps    s/p partial colectomy  . Hyperlipidemia   . Joint pain    in knees and back spasms  . Left leg swelling    wear compression hose  . Liver cirrhosis secondary to NASH (nonalcoholic steatohepatitis) ([place]) dx nov 2014   had enlarged spleen also   . Low blood pressure    bp varies  . Morbid obesity ([place])   . Persistent atrial fibrillation ([place])   . Pneumonia 10/2016  . PONV (postoperative nausea and vomiting)    in past none recent  . Portal hypertension ([place])   . Sleep apnea    uses cpap setting of 14  . Spleen enlarged   . 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 [facility])     Past Surgical History:  Procedure Laterality Date  . BREAST LUMPECTOMY WITH RADIOACTIVE SEED LOCALIZATION Right 08/29/2017   Procedure: RIGHT BREAST LUMPECTOMY WITH RADIOACTIVE SEEDS LOCALIZATION;  Surgeon: Erroll Luna, MD;  Location: [place];  Service: General;  Laterality: Right;  . CESAREAN SECTION  1989  . CHOLECYSTECTOMY  20 yrs ago  . COLECTOMY  2011  . COLONOSCOPY    . DILATATION & CURETTAGE/HYSTEROSCOPY WITH MYOSURE N/A 05/06/2016   Procedure: DILATATION & CURETTAGE/HYSTEROSCOPY WITH MYOSURE WITH POLYPECTOMY;  Surgeon: Janyth Pupa, DO;  Location: [facility];  Service: Gynecology;  Laterality: N/A;  . ESOPHAGOGASTRODUODENOSCOPY (EGD) WITH PROPOFOL N/A 09/04/2013   Procedure: ESOPHAGOGASTRODUODENOSCOPY (EGD) WITH PROPOFOL;  Surgeon: [name], MD;  Location: [facility];  Service: Endoscopy;  Laterality: N/A;  . KNEE ARTHROSCOPY  yrs ago   bilateral, one done x 1, one done twice     Home Medications:  Prior to Admission medications   Medication Sig Start Date End Date Taking?  Authorizing Provider  bumetanide (BUMEX) 2 MG tablet Take 2-4 mg by mouth See admin instructions. Take 4 mgs by mouth once daily, may take an additional 86m as needed for swelling   Yes [provider]  calcium carbonate (OS-CAL - DOSED IN MG OF ELEMENTAL CALCIUM) 1250 (500 Ca) MG tablet Take 1 tablet by mouth daily with breakfast.    Yes [provider]  cyclobenzaprine (FLEXERIL) 10 MG tablet Take 10 mg by mouth 3 (three) times daily as needed for muscle spasms.   Yes [provider]  diltiazem (CARDIZEM CD) 240 MG 24 hr capsule Take 1 capsule (240 mg total) by mouth daily. 07/11/17  Yes RSkeet Latch MD  ergocalciferol (VITAMIN D2) 50000 units capsule Take 50,000 Units by mouth 2 (two) times a week. On Wednesday and Saturday   Yes [provider]  JARDIANCE 25 MG TABS tablet Take 25 mg by mouth daily. 01/28/17  Yes [provider]  KLOR-CON M20 20 MEQ tablet Take 20 mEq by mouth daily.  03/23/17  Yes [provider]  oxyCODONE-acetaminophen (PERCOCET/ROXICET) 5-325 MG tablet Take 1 tablet by mouth every 8 (eight) hours as needed for severe pain.   Yes [provider]  propranolol (INDERAL) 10 MG tablet TAKE 1 TABLET BY MOUTH TWICE A DAY 03/09/18  Yes RSkeet Latch MD  spironolactone (ALDACTONE) 25 MG tablet Take 25 mg by mouth daily.  07/24/13  Yes SThurnell Lose MD    Inpatient Medications: Scheduled Meds: . docusate sodium  100 mg Oral BID  . insulin aspart  0-20 Units Subcutaneous TID WC  . insulin aspart  0-5 Units Subcutaneous QHS  . metoprolol tartrate  2.5 mg Intravenous Once  . nystatin   Topical BID  . pantoprazole  40 mg Oral Daily  . senna  1 tablet Oral QHS  . vancomycin       Continuous Infusions: . sodium chloride 75 mL/hr at 03/15/18 0253  . piperacillin-tazobactam (ZOSYN)  IV 3.375 g (03/15/18 0510)  . vancomycin     PRN Meds: acetaminophen **OR** acetaminophen, albuterol, fentaNYL (SUBLIMAZE)  injection, iopamidol, [DISCONTINUED] ondansetron **OR** ondansetron (ZOFRAN) IV, oxyCODONE  Allergies:    Allergies  Allergen Reactions  . Celecoxib Other (See Comments)    "speech slurred" with celebrex  . Shellfish Allergy Swelling and Rash    TINGLING AND SWELLING OF LIPS. LARGE WHELPS QUARTER-SIZE  . Barium-Containing Compounds Other (See Comments)    TACHYCARDIA  . Prednisone Other (See Comments)    TACHYCARDIA  . Statins Other (See Comments)    AGGRAVATED FIBROMYALGIA  . Ibuprofen Other (See Comments)    UNSPECIFIED SPECIFIC REACTION >> "DUE TO PLATELETS"  . Tylenol [Acetaminophen] Other (See Comments)    UNSPECIFIED SPECIFIC REACTION >> "DUE TO PLATELETS"    Social History:   Social History   Socioeconomic History  . Marital status: Married    Spouse name: Not on file  . Number of children: Not on file  . Years of education: Not on file  .  Highest education level: Not on file  Occupational History  . Occupation: Merchant navy officer  Social Needs  . Financial resource strain: Not on file  . Food insecurity:    Worry: Not on file    Inability: Not on file  . Transportation needs:    Medical: Not on file    Non-medical: Not on file  Tobacco Use  . Smoking status: Former Smoker    Packs/day: 1.00    Years: 3.00    Pack years: 3.00    Types: Cigarettes    Last attempt to quit: 09/19/1974    Years since quitting: 43.5  . Smokeless tobacco: Never Used  Substance and Sexual Activity  . Alcohol use: Yes    Comment: occ  . Drug use: No  . Sexual activity: Not on file  Lifestyle  . Physical activity:    Days per week: Not on file    Minutes per session: Not on file  . Stress: Not on file  Relationships  . Social connections:    Talks on phone: Not on file    Gets together: Not on file    Attends religious service: Not on file    Active member of club or organization: Not on file    Attends meetings of clubs or organizations: Not on file    Relationship  status: Not on file  . Intimate partner violence:    Fear of current or ex partner: Not on file    Emotionally abused: Not on file    Physically abused: Not on file    Forced sexual activity: Not on file  Other Topics Concern  . Not on file  Social History Narrative   Lives in [place] with her husband.  Instructor for early childhood development.      Family History:    Family History  Problem Relation Age of Onset  . Heart failure Mother   . Cancer Mother   . Hyperlipidemia Mother   . Congestive Heart Failure Mother   . Stroke Mother   . Lung cancer Father   . Cancer Father   . Cancer Brother   . Hyperlipidemia Brother   . Stroke Other      ROS:  Please see the history of present illness.   All other ROS reviewed and negative.     Physical Exam/Data:   Vitals:   03/15/18 0600 03/15/18 0615 03/15/18 0630 03/15/18 0645  BP: (!) 116/56  (!) 99/51   Pulse: (!) 127 84 (!) 127 (!) 134  Resp: (!) 21 (!) 24 (!) 26 (!) 26  Temp:      TempSrc:      SpO2: 99% 98% 96% 96%  Weight:      Height:        Intake/Output Summary (Last 24 hours) at 03/15/2018 0905 Last data filed at 03/15/2018 0002 Gross per 24 hour  Intake 2050 ml  Output -  Net 2050 ml   Filed Weights   03/14/18 2133 03/15/18 0412  Weight: (!) 359 lb 2.1 oz (162.9 kg) (!) 359 lb 2.1 oz (162.9 kg)   Body mass index is 56.25 kg/m.  General:  Morbidly obese, laying in bed in mild distress HEENT: sclera anicteric  Neck: unable to assess JVD due to body habitus Vascular: No carotid bruits; chronic venous stasis skin changes Cardiac:  normal S1, S2; RRR; no murmurs, gallops, or rubs Lungs:  Distant lung sounds, no obvious wheezing, rales, or rhonchi Abd: NABS, soft, obese, nontender, no  hepatomegaly Ext: non-pitting LE edema; RLE TTP, erythematous, warm to touch, area demarcated. LLE also with some TTP Musculoskeletal:  No deformities, BUE and BLE strength normal and equal Skin: warm and dry  Neuro:   CNs 2-12 intact, no focal abnormalities noted Psych:  Normal affect   EKG:  The EKG was personally reviewed and demonstrates:  Atrial fibrillation with RVR, rate 112; wandering baseline, non-ischemic Telemetry:  Telemetry was personally reviewed and demonstrates:  Atrial fibrillation with RVR, rate consistently >120bpm  Relevant CV Studies: Echocardiogram 2017: Study Conclusions  - Procedure narrative: Transthoracic echocardiography. The study   was technically difficult, as a result of body habitus.   Intravenous contrast (Definity) was administered. - Left ventricle: The cavity size was normal. Wall thickness was   increased in a pattern of mild LVH. Systolic function was normal.   The estimated ejection fraction was in the range of 55% to 60%.   Wall motion was normal; there were no regional wall motion   abnormalities. The study is not technically sufficient to allow   evaluation of LV diastolic function. - Mitral valve: Mildly thickened leaflets . There was trivial   regurgitation. - Left atrium: The atrium was mildly dilated. - Inferior vena cava: The vessel was dilated. The respirophasic   diameter changes were blunted (< 50%), consistent with elevated   central venous pressure. - Pericardium, extracardiac: A trivial pericardial effusion was   identified posterior to the heart.  Impressions:  - Technically difficult study. The patient was in atrial flutter   during the study. The LVEF is 55-60%, mild LVH, grossly normal   wall motion, trivial MR, mild LAE, dilated IVC, trivial posterior   pericardial effusion.  Laboratory Data:  Chemistry Recent Labs  Lab 03/14/18 2231 03/15/18 0335  NA 134* 139  K 3.5 3.9  CL 102 107  CO2 19* 20*  GLUCOSE 164* 144*  BUN 25* 27*  CREATININE 1.15* 1.15*  CALCIUM 8.9 8.9  GFRNONAA 50* 50*  GFRAA 58* 58*  ANIONGAP 13 12    Recent Labs  Lab 03/14/18 2231  PROT 6.7  ALBUMIN 3.3*  AST 59*  ALT 25  ALKPHOS 78  BILITOT  5.0*   Hematology Recent Labs  Lab 03/14/18 2231 03/15/18 0338  WBC 12.6* 9.4  RBC 4.58  4.51 4.40  HGB 14.2 13.6  HCT 40.9 40.9  MCV 89.3 93.0  MCH 31.0 30.9  MCHC 34.7 33.3  RDW 16.0* 16.0*  PLT 32* 24*   Cardiac EnzymesNo results for input(s): TROPONINI in the last 168 hours. No results for input(s): TROPIPOC in the last 168 hours.  BNPNo results for input(s): BNP, PROBNP in the last 168 hours.  DDimer No results for input(s): DDIMER in the last 168 hours.  Radiology/Studies:  Dg Chest 2 View  Result Date: 03/14/2018 CLINICAL DATA:  62 year old female with fever and vomiting. EXAM: CHEST - 2 VIEW COMPARISON:  Chest radiograph dated 10/31/2016 FINDINGS: There is cardiomegaly with mild vascular congestion and a small pleural effusion. Superimposed pneumonia is not excluded. There is no focal consolidation or pneumothorax. No acute osseous pathology. IMPRESSION: Cardiomegaly findings of CHF. Pneumonia is not excluded. Clinical correlation is recommended. Electronically Signed   By: Anner Crete M.D.   On: 03/14/2018 23:58   Dg Tibia/fibula Right  Result Date: 03/14/2018 CLINICAL DATA:  Swelling and pain in the right lower extremity. EXAM: RIGHT TIBIA AND FIBULA - 2 VIEW COMPARISON:  None. FINDINGS: Osteoarthritis of the right knee with medial femorotibial joint  space narrowing and spurring of the tibial spines and tibial plateau. Additionally there is patellofemoral joint space narrowing spurring. No joint effusion is noted. The ankle mortise and included subtalar joints are unremarkable. There is mild soft tissue induration of the leg compatible soft tissue edema. IMPRESSION: Mild diffuse soft tissue edema of the right leg. Osteoarthritis of the right knee. No acute osseous abnormality. Electronically Signed   By: Ashley Royalty M.D.   On: 03/14/2018 23:59   US Abdomen Limited Ruq  Result Date: 03/15/2018 CLINICAL DATA:  62 year old female with sepsis. EXAM: ULTRASOUND ABDOMEN  LIMITED RIGHT UPPER QUADRANT COMPARISON:  CT of the abdomen pelvis dated 07/10/2016 ultrasound dated 07/07/2017 FINDINGS: Gallbladder: Cholecystectomy. Common bile duct: Diameter: 12 mm Liver: Morphologic changes of cirrhosis. The liver demonstrates a heterogeneous echotexture. Portal vein is patent on color Doppler imaging with normal direction of blood flow towards the liver. IMPRESSION: 1. Cirrhosis. 2. Cholecystectomy. 3. Patent main portal vein with hepatopetal flow. 4. No ascites. Electronically Signed   By: Anner Crete M.D.   On: 03/15/2018 04:27    Assessment and Plan:   1. Atrial fibrillation with RVR: patient presented with sepsis. Found to be in Afib RVR with rates up to 130s. She is hypotensive limiting ability to utilize diltiazem. RVR likely driven by acute infection. Not on anticoagulation given cirrhosis and ITP history despite  CHA2DS2-VASc Score of 3 (HTN, DM, Female). BP improved with SBPs in the 110s-120s on my exam.  - Will trial diltiazem gtt at this time for rate control - Continue to avoid anticoagulation  2. Sepsis: Likely due to RLE cellulitis. Lactate peaked at 5.0 and improved with IVF. CXR without acute findings. BCx and UCx pending. Procal and cortisol pending. She was started on vanc/zosyn - Continue management per primary team/ PCCM  3. HTN: BP soft in the setting of sepsis. Home diltiazem and propranolol on hold - Continue to monitor closely and restart home medications as BP tolerates  4. Cirrhosis: bilirubin elevated from baseline this admission. RUQ [place] without acute findings. She is receiving IVF resuscitation for sepsis - Monitor volume status closely  5. ITP: PLT 24 this morning. Follows outpatient with Dr. Alen Blew - Continue to monitor closely - Avoid anticoagulation    For questions or updates, please contact [place] Please consult [email or web address] for contact info under Cardiology/STEMI.   Signed, Abigail Butts, PA-C  03/15/2018 9:05  AM [phone]

## 2018-03-15 NOTE — ED Provider Notes (Addendum)
  Physical Exam  BP (!) 113/55   Pulse (!) 118   Temp 98.2 F (36.8 C)   Resp (!) 21   Ht 5' 7"  (1.702 m)   Wt (!) 162.9 kg (359 lb 2.1 oz)   SpO2 97%   BMI 56.25 kg/m   Physical Exam  ED Course/Procedures     Procedures  MDM   Assuming care of patient from Dr. Tyrone Nine   Patient in the ED for fevers, vomiting. Workup thus far shows elevated lactate, elevated bili, thrombocytopenia, which is chronic in nature and mild leukocytosis.  Exam reveals cellulitis of the right lower extremity.  Patient has already received broad-spectrum antibiotics.  CT lower extremity has been ordered to rule out necrotizing infection.  Concerning findings are as following elevated lactate and elevated bilirubin. Important pending results are CT scan and repeat lactate.  According to Dr. Tyrone Nine, plan is to admit to step down icu at [facility].   Patient had no complains, no concerns from the nursing side. Will continue to monitor.   1:47 AM Sepsis reassessment has been completed.  Patient's lactic acid has gone up from 4.76 - 5. Patient was unable to tolerate laying flat for CT scan because of orthopnea.  I do not think that patient is having necrotizing infection of her lower extremity at this time, the erythema in her leg has not worsened, and although she has pain in her legs she does not have pain out of proportion to the exam in my opinion.  Patient is noted to be breathing about 24-26 times a minute.  Bicarb is 19 -she might be tachypneic because of her metabolic acidosis, the other possibilities that the work of breathing is because of her CHF and that is leading to lactic acidosis.  At this time I think that patient does merit getting 500 cc of IV fluid.  At the same time I also think ICU team needs to be involved in her care.  Patient normally wears a CPAP mask at nighttime, and I have advised the admitting team service to put patient on CPAP machine as she arrives.  [facility] team has arrived for  the transport, Dr Lucile Shutters, critical care service and I have discussed the care and critical care team will see the patient when she arrives at [facility].    CRITICAL CARE Performed by: Chiyo Fay   Total critical care time: 31 minutes  Critical care time was exclusive of separately billable procedures and treating other patients.  Critical care was necessary to treat or prevent imminent or life-threatening deterioration.  Critical care was time spent personally by me on the following activities: development of treatment plan with patient and/or surrogate as well as nursing, discussions with consultants, evaluation of patient's response to treatment, examination of patient, obtaining history from patient or surrogate, ordering and performing treatments and interventions, ordering and review of laboratory studies, ordering and review of radiographic studies, pulse oximetry and re-evaluation of patient's condition.   Varney Biles, MD 03/15/18 9563    Varney Biles, MD 03/15/18 0151

## 2018-03-15 NOTE — Progress Notes (Signed)
PULMONARY / CRITICAL CARE MEDICINE   Name: Hannah Khan MRN: [id number] DOB: 1956-05-16    ADMISSION DATE:  03/14/2018 CONSULTATION DATE:  03/15/2018  REFERRING MD:  Dr. Alcario Drought, [facility]  CHIEF COMPLAINT:  Fever  HISTORY OF PRESENT ILLNESS:   62 yo female former smoker presented to ER with fever (Tm 100.44F), nausea, vomiting, Rt leg swelling, hypotension, lactic acidosis for sepsis with cellulitis.  PAST MEDICAL HISTORY :  A fib, DM, Fibromyalgia, HTN, HLD, thrombocytopenia, NASH with cirrhosis, OSA  SUBJECTIVE:  Winded after walking to bathroom.  Wants something to drink.  Denies chest pain, abdominal pain, nausea.  VITAL SIGNS: BP (!) 99/51   Pulse (!) 134   Temp 98 F (36.7 C) (Oral)   Resp (!) 26   Ht 5' 7"  (1.702 m)   Wt (!) 359 lb 2.1 oz (162.9 kg)   SpO2 96%   BMI 56.25 kg/m   INTAKE / OUTPUT: I/O last 3 completed shifts: In: 2050 [IV Piggyback:2050] Out: -   PHYSICAL EXAMINATION:  General - alert, sitting on side of bed Eyes - pupils reactive ENT - no sinus tenderness, no oral exudate, no LAN Cardiac - irregular, tachycardic, no murmur Chest - decreased BS, no wheeze, rales Abd - soft, non tender Ext - 1+ edema Skin - area of erythema on Rt leg up to thigh Neuro - follows commands Psych - normal mood  LABS:  BMET Recent Labs  Lab 03/14/18 2231 03/15/18 0335  NA 134* 139  K 3.5 3.9  CL 102 107  CO2 19* 20*  BUN 25* 27*  CREATININE 1.15* 1.15*  GLUCOSE 164* 144*    Electrolytes Recent Labs  Lab 03/14/18 2231 03/15/18 0335  CALCIUM 8.9 8.9  MG  --  1.4*  PHOS  --  3.5    CBC Recent Labs  Lab 03/14/18 2231 03/15/18 0338  WBC 12.6* 9.4  HGB 14.2 13.6  HCT 40.9 40.9  PLT 32* 24*    Coag's No results for input(s): APTT, INR in the last 168 hours.  Sepsis Markers Recent Labs  Lab 03/15/18 0022 03/15/18 0335 03/15/18 0547  LATICACIDVEN 5.00* 3.8* 3.4*  PROCALCITON  --  39.08  --     ABG No results for input(s): PHART,  PCO2ART, PO2ART in the last 168 hours.  Liver Enzymes Recent Labs  Lab 03/14/18 2231  AST 59*  ALT 25  ALKPHOS 78  BILITOT 5.0*  ALBUMIN 3.3*    Cardiac Enzymes No results for input(s): TROPONINI, PROBNP in the last 168 hours.  Glucose Recent Labs  Lab 03/15/18 0419 03/15/18 0728  GLUCAP 130* 137*    Imaging Dg Chest 2 View  Result Date: 03/14/2018 CLINICAL DATA:  62 year old female with fever and vomiting. EXAM: CHEST - 2 VIEW COMPARISON:  Chest radiograph dated 10/31/2016 FINDINGS: There is cardiomegaly with mild vascular congestion and a small pleural effusion. Superimposed pneumonia is not excluded. There is no focal consolidation or pneumothorax. No acute osseous pathology. IMPRESSION: Cardiomegaly findings of CHF. Pneumonia is not excluded. Clinical correlation is recommended. Electronically Signed   By: Anner Crete M.D.   On: 03/14/2018 23:58   Dg Tibia/fibula Right  Result Date: 03/14/2018 CLINICAL DATA:  Swelling and pain in the right lower extremity. EXAM: RIGHT TIBIA AND FIBULA - 2 VIEW COMPARISON:  None. FINDINGS: Osteoarthritis of the right knee with medial femorotibial joint space narrowing and spurring of the tibial spines and tibial plateau. Additionally there is patellofemoral joint space narrowing spurring. No joint effusion  is noted. The ankle mortise and included subtalar joints are unremarkable. There is mild soft tissue induration of the leg compatible soft tissue edema. IMPRESSION: Mild diffuse soft tissue edema of the right leg. Osteoarthritis of the right knee. No acute osseous abnormality. Electronically Signed   By: Ashley Royalty M.D.   On: 03/14/2018 23:59   US Abdomen Limited Ruq  Result Date: 03/15/2018 CLINICAL DATA:  62 year old female with sepsis. EXAM: ULTRASOUND ABDOMEN LIMITED RIGHT UPPER QUADRANT COMPARISON:  CT of the abdomen pelvis dated 07/10/2016 ultrasound dated 07/07/2017 FINDINGS: Gallbladder: Cholecystectomy. Common bile duct:  Diameter: 12 mm Liver: Morphologic changes of cirrhosis. The liver demonstrates a heterogeneous echotexture. Portal vein is patent on color Doppler imaging with normal direction of blood flow towards the liver. IMPRESSION: 1. Cirrhosis. 2. Cholecystectomy. 3. Patent main portal vein with hepatopetal flow. 4. No ascites. Electronically Signed   By: Anner Crete M.D.   On: 03/15/2018 04:27     STUDIES:  Rt leg xray 6/26 >> mild diffuse edema, osteoarthritis knee Abd u/s 6/27 >> cirrhosis, s/p cholecystectomy  CULTURES: Blood 6/27 >> Urine 6/27 >>   ANTIBIOTICS: Vancomycin 6/27 >> Zosyn 6/27 >>  SIGNIFICANT EVENTS: 6/26 Admit 6/27 Cardiology consulted  LINES/TUBES:  DISCUSSION: She has severe sepsis from Rt leg cellulitis.  She has a fib from RVR - likely from sepsis and not currently being on her home cardizem/inderal.  ASSESSMENT / PLAN:  Severe sepsis from Rt leg cellulitis. - continue ABx - f/u procalcitonin  Lactic acidosis. - from sepsis - suspect she will be slow to clear this due to cirrhosis  Hypomagnesemia. - replace as needed  Hx of OSA. - CPAP qhs  A fib with RVR with hx of persistent a fib. Hx of HTN. - last seen by Dr. Oval Linsey with cardiology 02/06/17 - not on anticoagulation as outpt with hx of thrombocytopenia and cirrhosis - lopressor IV x one 6/27 - cardiology consulted - hold outpt cardizem, inderal, bumex in setting of hypotension  Hx of NASH with cirrhosis. - f/u LFTs - hold outpt aldactone  Thrombocytopenia in setting of cirrhosis and splenic sequestration. - last seen by Dr. Alen Blew with hematology 12/08/17 - f/u CBC  DM type II. - SSI  DVT prophylaxis - SCDs SUP - Protonix Nutrition - clear liquids Goals of care - full code  CC time 36 minutes  Chesley Mires, MD [place] 03/15/2018, 8:51 AM

## 2018-03-15 NOTE — Progress Notes (Signed)
Pharmacy Antibiotic Note  Hannah Khan is a 62 y.o. female admitted on 03/14/2018 with cellulitis.  Pharmacy has been consulted for vancomycin and zosyn dosing. Vancomycin 2 gm given at [facility] at 6/26  midnight and zosyn 3.375 gm given at [facility] 6/26 at 2315.    Plan: Zosyn 3.375g IV q8h (4 hour infusion).  Vancomycin 2 gm given at [facility] then vancomycin 1500 mg IV q24 for AUC 504 (SCr 1.15, IBW/ABW) F/u renal function, WBC, temp, culture data Vancomycin levels as needed  Height: 5' 7"  (170.2 cm) Weight: (!) 359 lb 2.1 oz (162.9 kg) IBW/kg (Calculated) : 61.6  Temp (24hrs), Avg:98.3 F (36.8 C), Min:98.2 F (36.8 C), Max:98.4 F (36.9 C)  Recent Labs  Lab 03/14/18 2231 03/14/18 2244 03/15/18 0022  WBC 12.6*  --   --   CREATININE 1.15*  --   --   LATICACIDVEN  --  4.76* 5.00*    Estimated Creatinine Clearance: 81.8 mL/min (A) (by C-G formula based on SCr of 1.15 mg/dL (H)).    Allergies  Allergen Reactions  . Celecoxib Other (See Comments)    "speech slurred" with celebrex  . Shellfish Allergy Swelling and Rash    TINGLING AND SWELLING OF LIPS. LARGE WHELPS QUARTER-SIZE  . Barium-Containing Compounds Other (See Comments)    TACHYCARDIA  . Prednisone Other (See Comments)    TACHYCARDIA  . Statins Other (See Comments)    AGGRAVATED FIBROMYALGIA  . Ibuprofen Other (See Comments)    UNSPECIFIED SPECIFIC REACTION >> "DUE TO PLATELETS"  . Tylenol [Acetaminophen] Other (See Comments)    UNSPECIFIED SPECIFIC REACTION >> "DUE TO PLATELETS"    Antimicrobials this admission: 6/27 vanc>> 6/27 zosyn >> Dose adjustments this admission:  Microbiology results: 6/26 BCx2>>  Thank you for allowing pharmacy to be a part of this patient's care.  Eudelia Bunch, Pharm.D. [phone] 03/15/2018 2:41 AM

## 2018-03-15 NOTE — Progress Notes (Addendum)
1) Lab confirmed they do have 2 blood cultures from [facility] on patient. 2) Patient unable to tolerate IV potassium due to pain, will try PO and see if she can tolerate.

## 2018-03-15 NOTE — Progress Notes (Signed)
Pt. Had fever of 101. Pt. Warm to touch. Tylenol PRN given. Will continue to monitor.

## 2018-03-15 NOTE — Progress Notes (Signed)
Patient seen and examined at bedside, patient admitted after midnight, please see earlier detailed admission note by Etta Quill, DO. Briefly, patient presented with cellulitis and severe sepsis. Associated atrial fibrillation with RVR complicated by hypotension. DId not respond to metoprolol overnight. On Cardizem as an outpatient, but with blood pressure low, will hold. Considering amiodarone, but with liver disease, will consult cardiology for recommendations. Continue antibiotics. Lactic acid trended down slightly. Blood cultures pending.   Cordelia Poche, MD [facility] 03/15/2018, 10:15 AM Pager: [phone]

## 2018-03-15 NOTE — ED Notes (Signed)
Pt unable to complete CT scan as she is unable to lie on her back

## 2018-03-15 NOTE — Plan of Care (Signed)
62 yo F with h/o obesity, A.Fib, HTN, presents to [facility] ED N/V and RLE pain.  2 areas of erythema on leg.  Severe pain out of proportion to skin findings reportedly.  Does have pulses in foot reportedly.  Tachycardic A.Fib with initial SBP 91, improved to 109 after 2L IVF.  Given zosyn and vanc.  Also concerning, has lactate of 4.7 initially.  No abd pain, source seems to be leg.  No subq air on plain films.  Will put in for SDU, but asked EDP to give gen surg a call to alert them and allow them to weigh in on case since story sounds suspicious for early necrotizing fasciitis.

## 2018-03-16 ENCOUNTER — Inpatient Hospital Stay (HOSPITAL_COMMUNITY): Payer: BC Managed Care – PPO

## 2018-03-16 ENCOUNTER — Telehealth: Payer: Self-pay | Admitting: Oncology

## 2018-03-16 DIAGNOSIS — D696 Thrombocytopenia, unspecified: Secondary | ICD-10-CM

## 2018-03-16 DIAGNOSIS — I4891 Unspecified atrial fibrillation: Secondary | ICD-10-CM

## 2018-03-16 LAB — COMPREHENSIVE METABOLIC PANEL
ALT: 36 U/L (ref 0–44)
AST: 65 U/L — AB (ref 15–41)
Albumin: 3 g/dL — ABNORMAL LOW (ref 3.5–5.0)
Alkaline Phosphatase: 47 U/L (ref 38–126)
Anion gap: 6 (ref 5–15)
BUN: 35 mg/dL — AB (ref 8–23)
CHLORIDE: 107 mmol/L (ref 98–111)
CO2: 22 mmol/L (ref 22–32)
CREATININE: 0.95 mg/dL (ref 0.44–1.00)
Calcium: 8.6 mg/dL — ABNORMAL LOW (ref 8.9–10.3)
GFR calc Af Amer: >60 mL/min (ref >60)
GFR calc non Af Amer: >60 mL/min (ref >60)
Glucose, Bld: 131 mg/dL — ABNORMAL HIGH (ref 70–99)
POTASSIUM: 3.8 mmol/L (ref 3.5–5.1)
SODIUM: 135 mmol/L (ref 135–145)
Total Bilirubin: 6 mg/dL — ABNORMAL HIGH (ref 0.3–1.2)
Total Protein: 6.2 g/dL — ABNORMAL LOW (ref 6.5–8.1)

## 2018-03-16 LAB — PHOSPHORUS: PHOSPHORUS: 3 mg/dL (ref 2.5–4.6)

## 2018-03-16 LAB — URINALYSIS, ROUTINE W REFLEX MICROSCOPIC
BILIRUBIN URINE: NEGATIVE
Glucose, UA: >=500 mg/dL — AB
Ketones, ur: NEGATIVE mg/dL
Leukocytes, UA: NEGATIVE
Nitrite: NEGATIVE
PH: 5 (ref 5.0–8.0)
PROTEIN: 30 mg/dL — AB
Specific Gravity, Urine: 1.027 (ref 1.005–1.030)

## 2018-03-16 LAB — CBC
HEMATOCRIT: 37.3 % (ref 36.0–46.0)
Hemoglobin: 12.3 g/dL (ref 12.0–15.0)
MCH: 30.5 pg (ref 26.0–34.0)
MCHC: 33 g/dL (ref 30.0–36.0)
MCV: 92.6 fL (ref 78.0–100.0)
PLATELETS: 17 10*3/uL — AB (ref 150–400)
RBC: 4.03 MIL/uL (ref 3.87–5.11)
RDW: 16.4 % — ABNORMAL HIGH (ref 11.5–15.5)
WBC: 4.7 10*3/uL (ref 4.0–10.5)

## 2018-03-16 LAB — MAGNESIUM: Magnesium: 2.3 mg/dL (ref 1.7–2.4)

## 2018-03-16 LAB — GLUCOSE, CAPILLARY
GLUCOSE-CAPILLARY: 147 mg/dL — AB (ref 70–99)
Glucose-Capillary: 151 mg/dL — ABNORMAL HIGH (ref 70–99)
Glucose-Capillary: 149 mg/dL — ABNORMAL HIGH (ref 70–99)
Glucose-Capillary: 130 mg/dL — ABNORMAL HIGH (ref 70–99)

## 2018-03-16 LAB — BILIRUBIN, FRACTIONATED(TOT/DIR/INDIR)
BILIRUBIN TOTAL: 6 mg/dL — AB (ref 0.3–1.2)
Bilirubin, Direct: 1.2 mg/dL — ABNORMAL HIGH (ref 0.0–0.2)
Indirect Bilirubin: 4.8 mg/dL — ABNORMAL HIGH (ref 0.3–0.9)

## 2018-03-16 LAB — ECHOCARDIOGRAM COMPLETE
Height: 67 in
Weight: 5964.77 oz

## 2018-03-16 LAB — HIV ANTIBODY (ROUTINE TESTING W REFLEX): HIV SCREEN 4TH GENERATION: NONREACTIVE

## 2018-03-16 LAB — PROCALCITONIN: Procalcitonin: 27.3 ng/mL

## 2018-03-16 MED ORDER — IOPAMIDOL (ISOVUE-300) INJECTION 61%
100.0000 mL | Freq: Once | INTRAVENOUS | Status: AC | PRN
  Administered 2018-03-16: 100 mL via INTRAVENOUS

## 2018-03-16 MED ORDER — PERFLUTREN LIPID MICROSPHERE
1.0000 mL | INTRAVENOUS | Status: AC | PRN
  Administered 2018-03-16: 3 mL via INTRAVENOUS
  Filled 2018-03-16: qty 10

## 2018-03-16 MED ORDER — HYDROMORPHONE HCL 1 MG/ML IJ SOLN
0.5000 mg | Freq: Once | INTRAMUSCULAR | Status: AC | PRN
Start: 2018-03-16 — End: 2018-03-16
  Administered 2018-03-16: 0.5 mg via INTRAVENOUS
  Filled 2018-03-16: qty 1

## 2018-03-16 MED ORDER — PERFLUTREN LIPID MICROSPHERE
INTRAVENOUS | Status: AC
  Filled 2018-03-16: qty 10

## 2018-03-16 MED ORDER — DILTIAZEM HCL 60 MG PO TABS
60.0000 mg | ORAL_TABLET | Freq: Four times a day (QID) | ORAL | Status: DC
  Administered 2018-03-16 – 2018-03-17 (×3): 60 mg via ORAL
  Filled 2018-03-16 (×3): qty 1

## 2018-03-16 NOTE — Progress Notes (Signed)
  Echocardiogram 2D Echocardiogram has been performed.  Hannah Khan Hannah Khan 03/16/2018, 3:01 PM

## 2018-03-16 NOTE — Progress Notes (Addendum)
PROGRESS NOTE    Hannah Khan  URK:[id number] DOB: 20-Jul-1956 DOA: 03/14/2018 PCP: Lujean Amel, MD   Brief Narrative: Hannah Khan is a 62 y.o. female with a history of NASH cirrhosis, hypertension, diabetes mellitus type 2, atrial fibrillation, pancytopenia, ITP, morbid obesity.  Patient presented secondary to dyspnea and right lower extremity pain.  She was found to have cellulitis of her right lower extremity in addition to meeting sepsis criteria and having atrial fibrillation with RVR.  Patient has been managed with IV fluids, IV antibiotics, diltiazem drip.  Blood pressure is improved but patient is still in RVR.  Cardiology is consulted for help managing A. fib.  Patient also with decreased platelets in setting of acute infection.  Medical oncology consulted.   Assessment & Plan:   Principal Problem:   Severe sepsis [facility]) Active Problems:   Essential hypertension   Cirrhosis of liver, with portal HTN & Acites, by CT Abdomen 07/20/13   Atrial fibrillation with rapid ventricular response (HCC)   Permanent atrial fibrillation (HCC)   Type 2 diabetes mellitus without complication, without long-term current use of insulin (HCC)   Chronic diastolic CHF (congestive heart failure) (HCC)   Thrombocytopenia (HCC)   Cellulitis of right lower extremity   Severe sepsis Secondary to cellulitis of her right leg.  Concern for bacteremia.  Possible she has an abscess.  Hypotension has resolved with IV antibiotics and initial fluid resuscitation. Procalcitonin trended down. Complicated by intrafibrillation with RVR.  Cellulitis of the right lower leg Erythema is stable.  Concern for possible abscess on right lateral aspect of leg.  Blood cultures without results to date. -Continue vancomycin and Zosyn -Continue to follow blood cultures -Obtain CT of the tibia/fibula in addition to knee; will give a dose of dilaudid IV prior to procedure  Permanent atrial fibrillation with RVR In  setting of acute infection.  Cardiology consulted.  Heart rates continue to be in the 120s to 130s.  Patient maxed out on diltiazem IV.  Blood pressure is stable today. -Cardiology recommendations pending for today  Pancytopenia ITP Patient follows with Dr. Alen Blew as an outpatient.  Platelets of around 30,000 on admission.  Down to 17,000 today.  No evidence of bleeding. -Oncology consult  NASH cirrhosis AST and ALT are not significantly elevated in setting of cirrhosis.  Bilirubin is up from baseline. -Fluid restricted diet -Hold prolactin and Bumex  Hyperbilirubinemia Likely related to liver injury in setting hypertension.  In setting of infection, could be linked to hemolysis, however, hemoglobin is stable. -Fractionate bilirubin as mentioned above  Diabetes mellitus, type 2 -Continue sliding scale insulin  Essential hypertension Patient is on diltiazem and propranolol as an outpatient.  Pressure has been low in setting of acute infection.  We will manage blood pressure as needed.  Chronic pain -Continue oxycodone  Morbid obesity Body mass index is 58.39 kg/m.    DVT prophylaxis: SCD Code Status:   Code Status: Full Code Family Communication: None at dischargebedside Disposition Plan: When medically stable and infection properly treated   Consultants:   Critical care medicine  Cardiology  Medical oncology  Procedures:   None  Antimicrobials:  Vancomycin (6/26>>  Zosyn (6/26>>   Subjective: Patient reports improvement in her pain.  Hungry today.  No chest pain or dyspnea.  Objective: Vitals:   03/16/18 0400 03/16/18 0405 03/16/18 0500 03/16/18 0600  BP: (!) 134/46  (!) 115/46 130/61  Pulse: (!) 121  (!) 148 (!) 112  Resp: 19  20 15  Temp:  99.9 F (37.7 C)    TempSrc:  Oral    SpO2: 92%  91% 92%  Weight:   (!) 169.1 kg (372 lb 12.8 oz)   Height:        Intake/Output Summary (Last 24 hours) at 03/16/2018 0751 Last data filed at 03/15/2018  1800 Gross per 24 hour  Intake 1475.42 ml  Output -  Net 1475.42 ml   Filed Weights   03/14/18 2133 03/15/18 0412 03/16/18 0500  Weight: (!) 162.9 kg (359 lb 2.1 oz) (!) 162.9 kg (359 lb 2.1 oz) (!) 169.1 kg (372 lb 12.8 oz)    Examination:  General exam: Appears calm and comfortable  Respiratory system: Clear to auscultation but diminished. Respiratory effort normal. Cardiovascular system: S1 & S2 heard, irregular rhythm with fast rate. No murmurs, rubs, gallops or clicks. Distant heart sounds Gastrointestinal system: Abdomen is nondistended, soft and non-tender. Normal bowel sounds heard. Central nervous system: Alert and oriented. No focal neurological deficits. Extremities: No edema. No calf tenderness Skin: No cyanosis. Right leg with slightly improved erythema of lower leg with continued erythema up to medial knee/lower thigh. Right lateral leg significant for increased warmth, but not fluctuance could be felt Psychiatry: Judgement and insight appear normal. Mood & affect appropriate.      Data Reviewed: I have personally reviewed following labs and imaging studies  CBC: Recent Labs  Lab 03/14/18 2231 03/15/18 0338 03/16/18 0319  WBC 12.6* 9.4 4.7  NEUTROABS 11.4*  --   --   HGB 14.2 13.6 12.3  HCT 40.9 40.9 37.3  MCV 89.3 93.0 92.6  PLT 32* 24* 17*   Basic Metabolic Panel: Recent Labs  Lab 03/14/18 2231 03/15/18 0335 03/16/18 0319  NA 134* 139 135  K 3.5 3.9 3.8  CL 102 107 107  CO2 19* 20* 22  GLUCOSE 164* 144* 131*  BUN 25* 27* 35*  CREATININE 1.15* 1.15* 0.95  CALCIUM 8.9 8.9 8.6*  MG  --  1.4* 2.3  PHOS  --  3.5 3.0   GFR: Estimated Creatinine Clearance: 101.4 mL/min (by C-G formula based on SCr of 0.95 mg/dL). Liver Function Tests: Recent Labs  Lab 03/14/18 2231 03/16/18 0319  AST 59* 65*  ALT 25 36  ALKPHOS 78 47  BILITOT 5.0* 6.0*  PROT 6.7 6.2*  ALBUMIN 3.3* 3.0*   No results for input(s): LIPASE, AMYLASE in the last 168 hours. No  results for input(s): AMMONIA in the last 168 hours. Coagulation Profile: No results for input(s): INR, PROTIME in the last 168 hours. Cardiac Enzymes: No results for input(s): CKTOTAL, CKMB, CKMBINDEX, TROPONINI in the last 168 hours. BNP (last 3 results) No results for input(s): PROBNP in the last 8760 hours. HbA1C: No results for input(s): HGBA1C in the last 72 hours. CBG: Recent Labs  Lab 03/15/18 0728 03/15/18 1202 03/15/18 1617 03/15/18 2120 03/16/18 0738  GLUCAP 137* 117* 184* 128* 147*   Lipid Profile: No results for input(s): CHOL, HDL, LDLCALC, TRIG, CHOLHDL, LDLDIRECT in the last 72 hours. Thyroid Function Tests: No results for input(s): TSH, T4TOTAL, FREET4, T3FREE, THYROIDAB in the last 72 hours. Anemia Panel: Recent Labs    03/14/18 2231  RETICCTPCT 3.9*   Sepsis Labs: Recent Labs  Lab 03/14/18 2244 03/15/18 0022 03/15/18 0335 03/15/18 0547 03/16/18 0319  PROCALCITON  --   --  39.08  --  27.30  LATICACIDVEN 4.76* 5.00* 3.8* 3.4*  --     Recent Results (from the past 240 hour(s))  MRSA PCR Screening     Status: None   Collection Time: 03/15/18  2:39 AM  Result Value Ref Range Status   MRSA by PCR NEGATIVE NEGATIVE Final    Comment:        The GeneXpert MRSA Assay (FDA approved for NASAL specimens only), is one component of a comprehensive MRSA colonization surveillance program. It is not intended to diagnose MRSA infection nor to guide or monitor treatment for MRSA infections. Performed at [facility], [place] [address], [place],  [address]          Radiology Studies: Dg Chest 2 View  Result Date: 03/14/2018 CLINICAL DATA:  62 year old female with fever and vomiting. EXAM: CHEST - 2 VIEW COMPARISON:  Chest radiograph dated 10/31/2016 FINDINGS: There is cardiomegaly with mild vascular congestion and a small pleural effusion. Superimposed pneumonia is not excluded. There is no focal consolidation or pneumothorax. No  acute osseous pathology. IMPRESSION: Cardiomegaly findings of CHF. Pneumonia is not excluded. Clinical correlation is recommended. Electronically Signed   By: Anner Crete M.D.   On: 03/14/2018 23:58   Dg Tibia/fibula Right  Result Date: 03/14/2018 CLINICAL DATA:  Swelling and pain in the right lower extremity. EXAM: RIGHT TIBIA AND FIBULA - 2 VIEW COMPARISON:  None. FINDINGS: Osteoarthritis of the right knee with medial femorotibial joint space narrowing and spurring of the tibial spines and tibial plateau. Additionally there is patellofemoral joint space narrowing spurring. No joint effusion is noted. The ankle mortise and included subtalar joints are unremarkable. There is mild soft tissue induration of the leg compatible soft tissue edema. IMPRESSION: Mild diffuse soft tissue edema of the right leg. Osteoarthritis of the right knee. No acute osseous abnormality. Electronically Signed   By: Ashley Royalty M.D.   On: 03/14/2018 23:59   Dg Chest Port 1 View  Result Date: 03/16/2018 CLINICAL DATA:  Hypoxia. EXAM: PORTABLE CHEST 1 VIEW COMPARISON:  Radiographs of March 14, 2018. FINDINGS: Stable cardiomegaly. No pneumothorax is noted. Mild central pulmonary vascular congestion is noted. No consolidative process is noted. No significant pleural effusion is noted. Bony thorax is unremarkable. IMPRESSION: Stable cardiomegaly with mild central pulmonary vascular congestion. Electronically Signed   By: Marijo Conception, M.D.   On: 03/16/2018 07:16   US Abdomen Limited Ruq  Result Date: 03/15/2018 CLINICAL DATA:  62 year old female with sepsis. EXAM: ULTRASOUND ABDOMEN LIMITED RIGHT UPPER QUADRANT COMPARISON:  CT of the abdomen pelvis dated 07/10/2016 ultrasound dated 07/07/2017 FINDINGS: Gallbladder: Cholecystectomy. Common bile duct: Diameter: 12 mm Liver: Morphologic changes of cirrhosis. The liver demonstrates a heterogeneous echotexture. Portal vein is patent on color Doppler imaging with normal direction  of blood flow towards the liver. IMPRESSION: 1. Cirrhosis. 2. Cholecystectomy. 3. Patent main portal vein with hepatopetal flow. 4. No ascites. Electronically Signed   By: Anner Crete M.D.   On: 03/15/2018 04:27        Scheduled Meds: . docusate sodium  100 mg Oral BID  . insulin aspart  0-20 Units Subcutaneous TID WC  . insulin aspart  0-5 Units Subcutaneous QHS  . nystatin   Topical BID  . pantoprazole  40 mg Oral Daily  . propranolol  20 mg Oral TID  . senna  1 tablet Oral QHS   Continuous Infusions: . sodium chloride 75 mL/hr at 03/15/18 1800  . sodium chloride 1,000 mL (03/15/18 1610)  . diltiazem (CARDIZEM) infusion 15 mg/hr (03/15/18 2123)  . piperacillin-tazobactam (ZOSYN)  IV 3.375 g (03/16/18 0609)  .  vancomycin Stopped (03/15/18 1917)     LOS: 2 days     Cordelia Poche, MD [facility] 03/16/2018, 7:51 AM Pager: [phone]  If 7PM-7AM, please contact night-coverage [email or web address] 03/16/2018, 7:51 AM

## 2018-03-16 NOTE — Progress Notes (Signed)
Pharmacy Antibiotic Note  Hannah Khan is a 62 y.o. female admitted on 03/14/2018 with cellulitis.  Pharmacy has been consulted for vancomycin and zosyn dosing. Vancomycin 2 gm given at [facility] at 6/26  midnight and zosyn 3.375 gm given at [facility] 6/26 at 2315.    Today, 03/16/2018 Day #2 antibiotics  Renal: SCr improved  Tmax = 101  Cultures pending  MRSA PCR - neg  No signs of necrotizing fascitis  Plan: Zosyn 3.375g IV q8h (4 hour infusion).  Adjust vancomycin to 1748m IV q24h for improved renal function F/u renal function, WBC, temp, culture data Vancomycin levels as needed - anticipate may be able to adjust antibiotics within next 1-2 days (doubt need for anaerobic nor pseudomonas coverage).    Height: 5' 7"  (170.2 cm) Weight: (!) 372 lb 12.8 oz (169.1 kg) IBW/kg (Calculated) : 61.6  Temp (24hrs), Avg:99.3 F (37.4 C), Min:98.3 F (36.8 C), Max:101 F (38.3 C)  Recent Labs  Lab 03/14/18 2231 03/14/18 2244 03/15/18 0022 03/15/18 0335 03/15/18 0338 03/15/18 0547 03/16/18 0319  WBC 12.6*  --   --   --  9.4  --  4.7  CREATININE 1.15*  --   --  1.15*  --   --  0.95  LATICACIDVEN  --  4.76* 5.00* 3.8*  --  3.4*  --     Estimated Creatinine Clearance: 101.4 mL/min (by C-G formula based on SCr of 0.95 mg/dL).    Allergies  Allergen Reactions  . Celecoxib Other (See Comments)    "speech slurred" with celebrex  . Shellfish Allergy Swelling and Rash    TINGLING AND SWELLING OF LIPS. LARGE WHELPS QUARTER-SIZE  . Barium-Containing Compounds Other (See Comments)    TACHYCARDIA  . Prednisone Other (See Comments)    TACHYCARDIA  . Statins Other (See Comments)    AGGRAVATED FIBROMYALGIA  . Ibuprofen Other (See Comments)    UNSPECIFIED SPECIFIC REACTION >> "DUE TO PLATELETS"  . Tylenol [Acetaminophen] Other (See Comments)    UNSPECIFIED SPECIFIC REACTION >> "DUE TO PLATELETS"   Antimicrobials this admission: 6/27 vanc>> 6/27 zosyn >> 6/27 nystatin >>  Dose  adjustments this admission:  Microbiology results: 6/26 BCx2>> 6/27 UCx>> 6/27 MRSA PCR neg  Thank you for allowing pharmacy to be a part of this patient's care.  DDoreene Eland PharmD, BCPS.   Pager: [phone]/28/2019 7:47 AM

## 2018-03-16 NOTE — Progress Notes (Signed)
PULMONARY / CRITICAL CARE MEDICINE   Name: Hannah Khan MRN: [id number] DOB: 1956/08/02    ADMISSION DATE:  03/14/2018 CONSULTATION DATE:  03/15/2018  REFERRING MD:  Dr. Alcario Drought, [facility]  CHIEF COMPLAINT:  Fever  HISTORY OF PRESENT ILLNESS:   62 yo female former smoker presented to ER with fever (Tm 100.32F), nausea, vomiting, Rt leg swelling, hypotension, lactic acidosis for sepsis with cellulitis.  PAST MEDICAL HISTORY :  A fib, DM, Fibromyalgia, HTN, HLD, thrombocytopenia, NASH with cirrhosis, OSA  SUBJECTIVE:  BP improved.  Has pain in Rt leg but better.  Denies chest pain.  Short of breath when walking, but okay at rest.  VITAL SIGNS: BP 109/66   Pulse (!) 110   Temp 98.7 F (37.1 C) (Oral)   Resp (!) 23   Ht 5' 7"  (1.702 m)   Wt (!) 372 lb 12.8 oz (169.1 kg)   SpO2 95%   BMI 58.39 kg/m   INTAKE / OUTPUT: I/O last 3 completed shifts: In: 3525.4 [P.O.:240; I.V.:1133.8; IV Piggyback:2151.7] Out: -   PHYSICAL EXAMINATION:  General - pleasant Eyes - pupils reactive ENT - no sinus tenderness, no oral exudate, no LAN Cardiac - irregular, no murmur Chest - no wheeze, rales Abd - soft, non tender Ext - 1+ edema Skin - decreased erythema Rt leg Neuro - normal strength Psych - normal mood   LABS:  BMET Recent Labs  Lab 03/14/18 2231 03/15/18 0335 03/16/18 0319  NA 134* 139 135  K 3.5 3.9 3.8  CL 102 107 107  CO2 19* 20* 22  BUN 25* 27* 35*  CREATININE 1.15* 1.15* 0.95  GLUCOSE 164* 144* 131*    Electrolytes Recent Labs  Lab 03/14/18 2231 03/15/18 0335 03/16/18 0319  CALCIUM 8.9 8.9 8.6*  MG  --  1.4* 2.3  PHOS  --  3.5 3.0    CBC Recent Labs  Lab 03/14/18 2231 03/15/18 0338 03/16/18 0319  WBC 12.6* 9.4 4.7  HGB 14.2 13.6 12.3  HCT 40.9 40.9 37.3  PLT 32* 24* 17*    Coag's No results for input(s): APTT, INR in the last 168 hours.  Sepsis Markers Recent Labs  Lab 03/15/18 0022 03/15/18 0335 03/15/18 0547 03/16/18 0319   LATICACIDVEN 5.00* 3.8* 3.4*  --   PROCALCITON  --  39.08  --  27.30    ABG No results for input(s): PHART, PCO2ART, PO2ART in the last 168 hours.  Liver Enzymes Recent Labs  Lab 03/14/18 2231 03/16/18 0319  AST 59* 65*  ALT 25 36  ALKPHOS 78 47  BILITOT 5.0* 6.0*  6.0*  ALBUMIN 3.3* 3.0*    Cardiac Enzymes No results for input(s): TROPONINI, PROBNP in the last 168 hours.  Glucose Recent Labs  Lab 03/15/18 0419 03/15/18 0728 03/15/18 1202 03/15/18 1617 03/15/18 2120 03/16/18 0738  GLUCAP 130* 137* 117* 184* 128* 147*    Imaging Dg Chest Port 1 View  Result Date: 03/16/2018 CLINICAL DATA:  Hypoxia. EXAM: PORTABLE CHEST 1 VIEW COMPARISON:  Radiographs of March 14, 2018. FINDINGS: Stable cardiomegaly. No pneumothorax is noted. Mild central pulmonary vascular congestion is noted. No consolidative process is noted. No significant pleural effusion is noted. Bony thorax is unremarkable. IMPRESSION: Stable cardiomegaly with mild central pulmonary vascular congestion. Electronically Signed   By: Marijo Conception, M.D.   On: 03/16/2018 07:16     STUDIES:  Rt leg xray 6/26 >> mild diffuse edema, osteoarthritis knee Abd u/s 6/27 >> cirrhosis, s/p cholecystectomy  CULTURES: Blood  6/26 >>  Blood 6/27 >> Urine 6/28 >>   ANTIBIOTICS: Vancomycin 6/27 >> Zosyn 6/27 >>  SIGNIFICANT EVENTS: 6/26 Admit 6/27 Cardiology consulted  LINES/TUBES:  DISCUSSION: Rt leg cellulitis clinically improved.  Blood pressure stabilizing.  HR still up, but better.  PLT lower, but no bleeding.  ASSESSMENT / PLAN:  Severe sepsis from Rt leg cellulitis. - continue ABx  Lactic acidosis. - from sepsis - suspect she will be slow to clear this due to cirrhosis  Hypomagnesemia. - replace as needed  Hx of OSA. - CPAP qhs  A fib with RVR with hx of persistent a fib. Hx of HTN. - per primary team and cardiology - not on anticoagulation as outpt with hx of thrombocytopenia and  cirrhosis  Hx of NASH with cirrhosis. - f/u LFTs intermittently  Thrombocytopenia in setting of cirrhosis and splenic sequestration. - hematology consulted 6/28  DM type II. - SSI  DVT prophylaxis - SCDs SUP - Protonix Nutrition - heart healthy, carb modified Goals of care - full code  Updated pt's husband at bedside  PCCM will sign off.  Please call if additional help needed.  Chesley Mires, MD [facility] Pulmonary/Critical Care 03/16/2018, 10:27 AM

## 2018-03-16 NOTE — Progress Notes (Signed)
Progress Note  Patient Name: Hannah Khan Date of Encounter: 03/16/2018  Primary Cardiologist: Skeet Latch, MD  Subjective   Feeling slightly better today, less SOB, less leg discomfort.  Inpatient Medications    Scheduled Meds: . docusate sodium  100 mg Oral BID  . insulin aspart  0-20 Units Subcutaneous TID WC  . insulin aspart  0-5 Units Subcutaneous QHS  . nystatin   Topical BID  . pantoprazole  40 mg Oral Daily  . propranolol  20 mg Oral TID  . senna  1 tablet Oral QHS   Continuous Infusions: . sodium chloride 75 mL/hr at 03/15/18 1800  . sodium chloride 1,000 mL (03/15/18 1610)  . diltiazem (CARDIZEM) infusion 10 mg/hr (03/16/18 0945)  . piperacillin-tazobactam (ZOSYN)  IV Stopped (03/16/18 1009)  . vancomycin Stopped (03/15/18 1917)   PRN Meds: acetaminophen **OR** acetaminophen, albuterol, fentaNYL (SUBLIMAZE) injection, HYDROmorphone (DILAUDID) injection, iopamidol, [DISCONTINUED] ondansetron **OR** ondansetron (ZOFRAN) IV, oxyCODONE   Vital Signs    Vitals:   03/16/18 0800 03/16/18 0900 03/16/18 0927 03/16/18 1000  BP: 107/89 (!) 98/51 95/60 109/66  Pulse: (!) 119 (!) 115 (!) 116 (!) 110  Resp: 19 18  (!) 23  Temp: 98.7 F (37.1 C)     TempSrc: Oral     SpO2: 93% 94%  95%  Weight:      Height:        Intake/Output Summary (Last 24 hours) at 03/16/2018 1118 Last data filed at 03/16/2018 0910 Gross per 24 hour  Intake 1121.67 ml  Output -  Net 1121.67 ml   Filed Weights   03/14/18 2133 03/15/18 0412 03/16/18 0500  Weight: (!) 359 lb 2.1 oz (162.9 kg) (!) 359 lb 2.1 oz (162.9 kg) (!) 372 lb 12.8 oz (169.1 kg)    Telemetry    Atrial fib rates currently 100-110 - Personally Reviewed   Physical Exam   GEN: No acute distress, morbidly obese WF NAD HEENT: Normocephalic, atraumatic, sclera non-icteric. Neck: No JVD or bruits. Cardiac: Irregularly rhythm, HR mildly elevated, soft SEM, no rubs or gallops.  Radials/DP/PT 1+ and equal  bilaterally.  Respiratory: Clear to auscultation bilaterally. Breathing is unlabored. GI: Soft, nontender, non-distended, BS +x 4. MS: no deformity. Extremities: No clubbing or cyanosis. Severely erythematous RLE, area demarcated, LLE with chronic appearing edema as well. Neuro:  AAOx3. Follows commands. Psych:  Responds to questions appropriately with a normal affect.  Labs    Chemistry Recent Labs  Lab 03/14/18 2231 03/15/18 0335 03/16/18 0319  NA 134* 139 135  K 3.5 3.9 3.8  CL 102 107 107  CO2 19* 20* 22  GLUCOSE 164* 144* 131*  BUN 25* 27* 35*  CREATININE 1.15* 1.15* 0.95  CALCIUM 8.9 8.9 8.6*  PROT 6.7  --  6.2*  ALBUMIN 3.3*  --  3.0*  AST 59*  --  65*  ALT 25  --  36  ALKPHOS 78  --  47  BILITOT 5.0*  --  6.0*  6.0*  GFRNONAA 50* 50* >60  GFRAA 58* 58* >60  ANIONGAP 13 12 6      Hematology Recent Labs  Lab 03/14/18 2231 03/15/18 0338 03/16/18 0319  WBC 12.6* 9.4 4.7  RBC 4.58  4.51 4.40 4.03  HGB 14.2 13.6 12.3  HCT 40.9 40.9 37.3  MCV 89.3 93.0 92.6  MCH 31.0 30.9 30.5  MCHC 34.7 33.3 33.0  RDW 16.0* 16.0* 16.4*  PLT 32* 24* 17*    Radiology    Dg  Chest 2 View  Result Date: 03/14/2018 CLINICAL DATA:  62 year old female with fever and vomiting. EXAM: CHEST - 2 VIEW COMPARISON:  Chest radiograph dated 10/31/2016 FINDINGS: There is cardiomegaly with mild vascular congestion and a small pleural effusion. Superimposed pneumonia is not excluded. There is no focal consolidation or pneumothorax. No acute osseous pathology. IMPRESSION: Cardiomegaly findings of CHF. Pneumonia is not excluded. Clinical correlation is recommended. Electronically Signed   By: Anner Crete M.D.   On: 03/14/2018 23:58   Dg Tibia/fibula Right  Result Date: 03/14/2018 CLINICAL DATA:  Swelling and pain in the right lower extremity. EXAM: RIGHT TIBIA AND FIBULA - 2 VIEW COMPARISON:  None. FINDINGS: Osteoarthritis of the right knee with medial femorotibial joint space narrowing  and spurring of the tibial spines and tibial plateau. Additionally there is patellofemoral joint space narrowing spurring. No joint effusion is noted. The ankle mortise and included subtalar joints are unremarkable. There is mild soft tissue induration of the leg compatible soft tissue edema. IMPRESSION: Mild diffuse soft tissue edema of the right leg. Osteoarthritis of the right knee. No acute osseous abnormality. Electronically Signed   By: Ashley Royalty M.D.   On: 03/14/2018 23:59   Dg Chest Port 1 View  Result Date: 03/16/2018 CLINICAL DATA:  Hypoxia. EXAM: PORTABLE CHEST 1 VIEW COMPARISON:  Radiographs of March 14, 2018. FINDINGS: Stable cardiomegaly. No pneumothorax is noted. Mild central pulmonary vascular congestion is noted. No consolidative process is noted. No significant pleural effusion is noted. Bony thorax is unremarkable. IMPRESSION: Stable cardiomegaly with mild central pulmonary vascular congestion. Electronically Signed   By: Marijo Conception, M.D.   On: 03/16/2018 07:16   US Abdomen Limited Ruq  Result Date: 03/15/2018 CLINICAL DATA:  62 year old female with sepsis. EXAM: ULTRASOUND ABDOMEN LIMITED RIGHT UPPER QUADRANT COMPARISON:  CT of the abdomen pelvis dated 07/10/2016 ultrasound dated 07/07/2017 FINDINGS: Gallbladder: Cholecystectomy. Common bile duct: Diameter: 12 mm Liver: Morphologic changes of cirrhosis. The liver demonstrates a heterogeneous echotexture. Portal vein is patent on color Doppler imaging with normal direction of blood flow towards the liver. IMPRESSION: 1. Cirrhosis. 2. Cholecystectomy. 3. Patent main portal vein with hepatopetal flow. 4. No ascites. Electronically Signed   By: Anner Crete M.D.   On: 03/15/2018 04:27    Patient Profile     62 y.o. female with chronic atrial fibrillation (not anticoagulated due to thrombocytopenia, felt poor candidate for Watchman), morbid obesity, HTN, DM2, NASH cirrhosis, ITP, portal HTN, fibromyalgia who was admitted with  RLE pain and erythema and found to be septic due to cellulitis. Admission notable for lactic acidosis, hypomagnesemia, and rapid atrial fibrillation.  Assessment & Plan    1. Permanent atrial fibrillation with RVR: elevated HR likely reflective of acute sepsis as she is in atrial fib all the time. Not on anticoagulation given cirrhosis and ITP history. Could consider repeat echocardiogram this admission to help ensure best regimen undertaken - last evaluated in 2017 with EF 55-60%. HR is likely appropriate for level of acute illness. She reports baseline HR in the 90s, sometimes in low 100s at home. Would continue current regimen and can consider consolidating diltiazem closer to DC. Alternatively as her medical illness improves she may be able to just continue home regimen with propranolol.   2. Sepsis: Likely due to RLE cellulitis.Continue management per primary team/PCCM.  3. HTN: BP soft in the setting of sepsis. Continue current course.  4. Cirrhosis: bilirubin elevated from baseline this admission. RUQ [place] without acute findings. Need to  monitor volume given IVF resuscitation. Weight jump not totally clear this AM but breathing better, follow.  5. ITP: PLT 17k this morning. PCCM has consulted hematology.  For questions or updates, please contact [place] Please consult [email or web address] for contact info under Cardiology/STEMI.  Signed, Charlie Pitter, PA-C 03/16/2018, 11:18 AM

## 2018-03-16 NOTE — Telephone Encounter (Signed)
R/s 9/27 appt due to appts being scheduled MD lunch - called pt - no answer and vmail full - rescheduled appt and sent reminder letter in the mail.

## 2018-03-17 ENCOUNTER — Inpatient Hospital Stay (HOSPITAL_COMMUNITY): Payer: BC Managed Care – PPO

## 2018-03-17 DIAGNOSIS — L0390 Cellulitis, unspecified: Secondary | ICD-10-CM

## 2018-03-17 DIAGNOSIS — M8450XA Pathological fracture in neoplastic disease, unspecified site, initial encounter for fracture: Secondary | ICD-10-CM

## 2018-03-17 DIAGNOSIS — D693 Immune thrombocytopenic purpura: Secondary | ICD-10-CM

## 2018-03-17 DIAGNOSIS — M79609 Pain in unspecified limb: Secondary | ICD-10-CM

## 2018-03-17 DIAGNOSIS — M7989 Other specified soft tissue disorders: Secondary | ICD-10-CM

## 2018-03-17 LAB — CBC
HCT: 36.8 % (ref 36.0–46.0)
Hemoglobin: 12.4 g/dL (ref 12.0–15.0)
MCH: 30.6 pg (ref 26.0–34.0)
MCHC: 33.7 g/dL (ref 30.0–36.0)
MCV: 90.9 fL (ref 78.0–100.0)
PLATELETS: 28 10*3/uL — AB (ref 150–400)
RBC: 4.05 MIL/uL (ref 3.87–5.11)
RDW: 16.4 % — ABNORMAL HIGH (ref 11.5–15.5)
WBC: 6.5 10*3/uL (ref 4.0–10.5)

## 2018-03-17 LAB — URINE CULTURE

## 2018-03-17 LAB — BASIC METABOLIC PANEL
Anion gap: 10 (ref 5–15)
BUN: 37 mg/dL — AB (ref 8–23)
CALCIUM: 8.8 mg/dL — AB (ref 8.9–10.3)
CHLORIDE: 103 mmol/L (ref 98–111)
CO2: 21 mmol/L — ABNORMAL LOW (ref 22–32)
Creatinine, Ser: 0.97 mg/dL (ref 0.44–1.00)
GFR calc Af Amer: >60 mL/min (ref >60)
GLUCOSE: 131 mg/dL — AB (ref 70–99)
POTASSIUM: 4.2 mmol/L (ref 3.5–5.1)
Sodium: 134 mmol/L — ABNORMAL LOW (ref 135–145)

## 2018-03-17 LAB — GLUCOSE, CAPILLARY
GLUCOSE-CAPILLARY: 120 mg/dL — AB (ref 70–99)
GLUCOSE-CAPILLARY: 110 mg/dL — AB (ref 70–99)
Glucose-Capillary: 132 mg/dL — ABNORMAL HIGH (ref 70–99)
Glucose-Capillary: 107 mg/dL — ABNORMAL HIGH (ref 70–99)

## 2018-03-17 LAB — PROCALCITONIN: Procalcitonin: 19.21 ng/mL

## 2018-03-17 LAB — LACTATE DEHYDROGENASE: LDH: 209 U/L — ABNORMAL HIGH (ref 98–192)

## 2018-03-17 MED ORDER — PROPRANOLOL HCL ER 80 MG PO CP24
80.0000 mg | ORAL_CAPSULE | Freq: Every day | ORAL | Status: DC
  Administered 2018-03-17 – 2018-03-23 (×7): 80 mg via ORAL
  Filled 2018-03-17 (×7): qty 1

## 2018-03-17 MED ORDER — DILTIAZEM HCL ER COATED BEADS 240 MG PO CP24
240.0000 mg | ORAL_CAPSULE | Freq: Every day | ORAL | Status: DC
  Administered 2018-03-17 – 2018-03-23 (×7): 240 mg via ORAL
  Filled 2018-03-17 (×2): qty 1
  Filled 2018-03-17: qty 2
  Filled 2018-03-17 (×4): qty 1

## 2018-03-17 NOTE — Progress Notes (Signed)
Progress Note  Patient Name: Hannah Khan Date of Encounter: 03/17/2018  Primary Cardiologist: Skeet Latch, MD  Subjective   HR reasonably well-controlled on diltiazem and propranolol.  Inpatient Medications    Scheduled Meds: . diltiazem  60 mg Oral Q6H  . docusate sodium  100 mg Oral BID  . insulin aspart  0-20 Units Subcutaneous TID WC  . insulin aspart  0-5 Units Subcutaneous QHS  . nystatin   Topical BID  . pantoprazole  40 mg Oral Daily  . propranolol  20 mg Oral TID  . senna  1 tablet Oral QHS   Continuous Infusions: . sodium chloride 75 mL/hr at 03/17/18 0600  . sodium chloride 10 mL/hr at 03/17/18 0600  . piperacillin-tazobactam (ZOSYN)  IV Stopped (03/17/18 0932)  . vancomycin Stopped (03/16/18 1928)   PRN Meds: acetaminophen **OR** acetaminophen, albuterol, fentaNYL (SUBLIMAZE) injection, iopamidol, [DISCONTINUED] ondansetron **OR** ondansetron (ZOFRAN) IV, oxyCODONE   Vital Signs    Vitals:   03/17/18 0528 03/17/18 0600 03/17/18 0700 03/17/18 0800  BP:  120/70 125/64 117/65  Pulse:  (!) 105 (!) 103 93  Resp:  20 18 (!) 22  Temp:    98.2 F (36.8 C)  TempSrc:    Oral  SpO2:  93% 94% 95%  Weight: (!) 375 lb (170.1 kg)     Height:        Intake/Output Summary (Last 24 hours) at 03/17/2018 0939 Last data filed at 03/17/2018 0600 Gross per 24 hour  Intake 2132.67 ml  Output 1750 ml  Net 382.67 ml   Filed Weights   03/15/18 0412 03/16/18 0500 03/17/18 0528  Weight: (!) 359 lb 2.1 oz (162.9 kg) (!) 372 lb 12.8 oz (169.1 kg) (!) 375 lb (170.1 kg)    Telemetry    Atrial fib rates currently 100-110 - Personally Reviewed   Physical Exam   GEN: No acute distress, morbidly obese WF NAD HEENT: Normocephalic, atraumatic, sclera non-icteric. Neck: No JVD or bruits. Cardiac: Irregularly rhythm, HR mildly elevated, soft SEM, no rubs or gallops.  Radials/DP/PT 1+ and equal bilaterally.  Respiratory: Clear to auscultation bilaterally. Breathing  is unlabored. GI: Soft, nontender, non-distended, BS +x 4. MS: no deformity. Extremities: No clubbing or cyanosis. Severely erythematous RLE, area demarcated, LLE with chronic appearing edema as well. Neuro:  AAOx3. Follows commands. Psych:  Responds to questions appropriately with a normal affect.  Labs    Chemistry Recent Labs  Lab 03/14/18 2231 03/15/18 0335 03/16/18 0319 03/17/18 0308  NA 134* 139 135 134*  K 3.5 3.9 3.8 4.2  CL 102 107 107 103  CO2 19* 20* 22 21*  GLUCOSE 164* 144* 131* 131*  BUN 25* 27* 35* 37*  CREATININE 1.15* 1.15* 0.95 0.97  CALCIUM 8.9 8.9 8.6* 8.8*  PROT 6.7  --  6.2*  --   ALBUMIN 3.3*  --  3.0*  --   AST 59*  --  65*  --   ALT 25  --  36  --   ALKPHOS 78  --  47  --   BILITOT 5.0*  --  6.0*  6.0*  --   GFRNONAA 50* 50* >60 >60  GFRAA 58* 58* >60 >60  ANIONGAP 13 12 6 10      Hematology Recent Labs  Lab 03/15/18 0338 03/16/18 0319 03/17/18 0308  WBC 9.4 4.7 6.5  RBC 4.40 4.03 4.05  HGB 13.6 12.3 12.4  HCT 40.9 37.3 36.8  MCV 93.0 92.6 90.9  MCH 30.9 30.5  30.6  MCHC 33.3 33.0 33.7  RDW 16.0* 16.4* 16.4*  PLT 24* 17* 28*    Radiology    Ct Tibia Fibula Right W Contrast  Result Date: 03/16/2018 CLINICAL DATA:  Right lower leg cellulitis. EXAM: CT OF THE LOWER RIGHT EXTREMITY WITH CONTRAST TECHNIQUE: Multidetector CT imaging of the right tibia and fibula was performed according to the standard protocol following intravenous contrast administration. COMPARISON:  Right tibia fibula x-rays dated March 14, 2018. CONTRAST:  140m ISOVUE-300 IOPAMIDOL (ISOVUE-300) INJECTION 61% FINDINGS: Bones/Joint/Cartilage No acute fracture or dislocation. No cortical destruction or osteolysis. Mild knee osteoarthritis. No knee or tibiotalar joint effusion. Ligaments Suboptimally assessed by CT. Muscles and Tendons No significant muscle atrophy. The flexor, extensor, peroneal, and Achilles tendons are grossly intact. Soft tissues Moderate soft tissue  swelling involving the entire lateral lower leg. More circumferential moderate soft tissue swelling and skin thickening of the mid to distal lower leg. No fluid collection or soft tissue mass. IMPRESSION: 1. Moderate circumferential soft tissue swelling and skin thickening of the mid to distal lower leg, consistent with clinical history of cellulitis. No abscess. 2.  No acute osseous abnormality. Electronically Signed   By: WTitus DubinM.D.   On: 03/16/2018 13:29   Dg Chest Port 1 View  Result Date: 03/16/2018 CLINICAL DATA:  Hypoxia. EXAM: PORTABLE CHEST 1 VIEW COMPARISON:  Radiographs of March 14, 2018. FINDINGS: Stable cardiomegaly. No pneumothorax is noted. Mild central pulmonary vascular congestion is noted. No consolidative process is noted. No significant pleural effusion is noted. Bony thorax is unremarkable. IMPRESSION: Stable cardiomegaly with mild central pulmonary vascular congestion. Electronically Signed   By: JMarijo Conception M.D.   On: 03/16/2018 07:16    Patient Profile     62y.o. female with chronic atrial fibrillation (not anticoagulated due to thrombocytopenia, felt poor candidate for Watchman), morbid obesity, HTN, DM2, NASH cirrhosis, ITP, portal HTN, fibromyalgia who was admitted with RLE pain and erythema and found to be septic due to cellulitis. Admission notable for lactic acidosis, hypomagnesemia, and rapid atrial fibrillation.  Assessment & Plan    1. Permanent atrial fibrillation with RVR: elevated HR likely reflective of acute sepsis as she is in atrial fib all the time. Not on anticoagulation given cirrhosis and ITP history. Could consider repeat echocardiogram this admission to help ensure best regimen undertaken - last evaluated in 2017 with EF 55-60%. HR is likely appropriate for level of acute illness. She reports baseline HR in the 90s, sometimes in low 100s at home.   - change propranolol to LA formulation  - on diltiazem 60 mg q6 hrs - better rate control.  Will consolidate to diltiazem LA today.  2. Sepsis: Likely due to RLE cellulitis.Continue management per primary team/PCCM.  3. HTN: BP has been stable.  4. Cirrhosis: bilirubin elevated from baseline this admission. RUQ [place] acute findings. Need to monitor volume given IVF resuscitation. Weight jump not totally clear this AM but breathing better, follow. On propranolol.  5. ITP: PLT up to 28K from 17k this morning. PCCM has consulted hematology.  No further cardiac suggestions at this point. Would not consider anticoagulation for a-fib with ongoing thrombocytopenia at this point. Rate control is reasonable. Cardiology will sign-off. Follow-up with Dr. ROval Linseyafter discharge.  For questions or updates, please contact [place] consult [email or web address] for contact info under Cardiology/STEMI.  KPixie Casino MD, FSaint [place] East FCosmopolisDirector of the [facility] Lip[facility] &  [facility] Diplomate of the American Board of Clinical Lipidology Attending Cardiologist  Direct Dial: [phone]  Fax: [phone]  Website:  [email or web address]  Pixie Casino, MD 03/17/2018, 9:39 AM

## 2018-03-17 NOTE — Progress Notes (Addendum)
Received patient via wheelchair from step-down ICU. A/O, VSS, denies discomfort, husband at bedside. I agree with head to toe assessment which was charted this AM by step-down RN.

## 2018-03-17 NOTE — Progress Notes (Signed)
HEMATOLOGY-ONCOLOGY PROGRESS NOTE  SUBJECTIVE: We are consulted for evaluation of worsening thrombocytopenia. This is a 62 year old lady who was diagnosed with ITP by Dr. Alen Blew.  She had previously received Nplate with excellent results.  She was able to undergo surgery for the breast atypical ductal hyperplasia. She started having nausea and vomiting and was noted to have cellulitis of the right lower extremity.  Because this she was admitted to the hospital and started on broad-spectrum antibiotics.  Nausea vomiting had subsided.  On admission her platelet count was 32 and it had declined to 17.  Today is up to 28.  Her best platelet count recently was December 2018 when it went up to 70 because of endplate.  After the endplate was stopped she went back to her baseline which is approximately platelet count of 30.  OBJECTIVE: REVIEW OF SYSTEMS:   Constitutional: Fatigue sitting up in chair Eyes: Denies blurriness of vision Ears, nose, mouth, throat, and face: Denies mucositis or sore throat Respiratory: Denies cough, dyspnea or wheezes Cardiovascular: Denies palpitation, chest discomfort Gastrointestinal: Nausea has subsided Skin: Denies abnormal skin rashes Lymphatics: Denies new lymphadenopathy or easy bruising Neurological:Denies numbness, tingling or new weaknesses Behavioral/Psych: Mood is stable, no new changes  Extremities: No lower extremity edema  All other systems were reviewed with the patient and are negative.  I have reviewed the past medical history, past surgical history, social history and family history with the patient and they are unchanged from previous note.   PHYSICAL EXAMINATION: ECOG PERFORMANCE STATUS: 1 - Symptomatic but completely ambulatory  Vitals:   03/17/18 0700 03/17/18 0800  BP: 125/64 117/65  Pulse: (!) 103 93  Resp: 18 (!) 22  Temp:  98.2 F (36.8 C)  SpO2: 94% 95%   Filed Weights   03/15/18 0412 03/16/18 0500 03/17/18 0528  Weight: (!) 359  lb 2.1 oz (162.9 kg) (!) 372 lb 12.8 oz (169.1 kg) (!) 375 lb (170.1 kg)    GENERAL:alert, no distress and comfortable SKIN: skin color, texture, turgor are normal, no rashes or significant lesions EYES: normal, Conjunctiva are pink and non-injected, sclera clear OROPHARYNX:no exudate, no erythema and lips, buccal mucosa, and tongue normal  NECK: supple, thyroid normal size, non-tender, without nodularity LYMPH:  no palpable lymphadenopathy in the cervical, axillary or inguinal LUNGS: clear to auscultation and percussion with normal breathing effort HEART: regular rate & rhythm and no murmurs and no lower extremity edema ABDOMEN:abdomen soft, non-tender and normal bowel sounds Musculoskeletal:no cyanosis of digits and no clubbing  NEURO: alert & oriented x 3 with fluent speech, no focal motor/sensory deficits  LABORATORY DATA:  I have reviewed the data as listed CMP Latest Ref Rng & Units 03/17/2018 03/16/2018 03/16/2018  Glucose 70 - 99 mg/dL 131(H) - 131(H)  BUN 8 - 23 mg/dL 37(H) - 35(H)  Creatinine 0.44 - 1.00 mg/dL 0.97 - 0.95  Sodium 135 - 145 mmol/L 134(L) - 135  Potassium 3.5 - 5.1 mmol/L 4.2 - 3.8  Chloride 98 - 111 mmol/L 103 - 107  CO2 22 - 32 mmol/L 21(L) - 22  Calcium 8.9 - 10.3 mg/dL 8.8(L) - 8.6(L)  Total Protein 6.5 - 8.1 g/dL - - 6.2(L)  Total Bilirubin 0.3 - 1.2 mg/dL - 6.0(H) 6.0(H)  Alkaline Phos 38 - 126 U/L - - 47  AST 15 - 41 U/L - - 65(H)  ALT 0 - 44 U/L - - 36    Lab Results  Component Value Date   WBC 6.5 03/17/2018  HGB 12.4 03/17/2018   HCT 36.8 03/17/2018   MCV 90.9 03/17/2018   PLT 28 (LL) 03/17/2018   NEUTROABS 11.4 (H) 03/14/2018    ASSESSMENT AND PLAN: 1.  Severe thrombocytopenia: Due to ITP.  patient is asymptomatic without any bleeding problems. Unless the patient needs surgery or presents with bleeding issues there is no need to treat her thrombocytopenia at this time.  Previously she had responded very well with endplate.  Apparently  there were insurance issues for approval of Nplate. 2 severe cellulitis: On broad-spectrum antibiotics.  3 fractures of the right arm: Surgery is not in the plan. Please do not hesitate to call us if there are any further concerns for bleeding.

## 2018-03-17 NOTE — Progress Notes (Signed)
PROGRESS NOTE    Hannah Khan  NTZ:[id number] DOB: 1955/12/30 DOA: 03/14/2018 PCP: Lujean Amel, MD   Brief Narrative: Hannah Khan is a 62 y.o. female with a history of NASH cirrhosis, hypertension, diabetes mellitus type 2, atrial fibrillation, pancytopenia, ITP, morbid obesity.  Patient presented secondary to dyspnea and right lower extremity pain.  She was found to have cellulitis of her right lower extremity in addition to meeting sepsis criteria and having atrial fibrillation with RVR.  Patient has been managed with IV fluids, IV antibiotics, diltiazem drip.  Blood pressure is improved but patient is still in RVR.  Cardiology is consulted for help managing A. fib.  Patient also with decreased platelets in setting of acute infection.  Medical oncology consulted.   Assessment & Plan:   Principal Problem:   Severe sepsis [facility]) Active Problems:   Essential hypertension   Cirrhosis of liver, with portal HTN & Acites, by CT Abdomen 07/20/13   Atrial fibrillation with rapid ventricular response (HCC)   Permanent atrial fibrillation (HCC)   Type 2 diabetes mellitus without complication, without long-term current use of insulin (HCC)   Chronic diastolic CHF (congestive heart failure) (HCC)   Thrombocytopenia (HCC)   Cellulitis of right lower extremity   Severe sepsis Secondary to cellulitis of her right leg.  Concern for bacteremia.  No abscess on CT scan.  Hypotension has resolved with IV antibiotics and initial fluid resuscitation. Procalcitonin trended down. Complicated by atrial fibrillation with RVR.  Cellulitis of the right lower leg Erythema is stable.  No abscess. Blood cultures without results to date -Continue vancomycin and Zosyn; if no improvement in cellulitis, will adjust antibiotic coverage -Continue to follow blood cultures -Venous duplex to rule out DVT  Permanent atrial fibrillation with RVR In setting of acute infection.  Cardiology consulted.  Heart rates  continue to be in the 120s to 130s.  Patient maxed out on diltiazem IV.  Blood pressure is stable today. -Cardiology recommendations pending for today  Pancytopenia ITP Patient follows with Dr. Alen Blew as an outpatient.  Platelets of around 30,000 on admission.  Improved to 28k today.  No evidence of bleeding. -Oncology consult  NASH cirrhosis AST and ALT are not significantly elevated in setting of cirrhosis.  Bilirubin is up from baseline. -Fluid restricted diet -Hold prolactin and Bumex -Hepatic function panel  Hyperbilirubinemia Mostly indirect. LDH mildly elevated. Hemoglobin stable. Bilirubin stable. -Hepatoglobin pending  Diabetes mellitus, type 2 -Continue sliding scale insulin  Essential hypertension Patient is on diltiazem and propranolol as an outpatient.  Pressure has been low in setting of acute infection.  We will manage blood pressure as needed.  Chronic pain -Continue oxycodone  Morbid obesity Body mass index is 58.73 kg/m.    DVT prophylaxis: SCD Code Status:   Code Status: Full Code Family Communication: Husband at bedside Disposition Plan: Transfer to telemetry today   Consultants:   Critical care medicine  Cardiology  Medical oncology  Procedures:   None  Antimicrobials:  Vancomycin (6/26>>  Zosyn (6/26>>   Subjective: Pain has migrated to right medial thigh. Afebrile overnight.  Objective: Vitals:   03/17/18 0528 03/17/18 0600 03/17/18 0700 03/17/18 0800  BP:  120/70 125/64 117/65  Pulse:  (!) 105 (!) 103 93  Resp:  20 18 (!) 22  Temp:      TempSrc:      SpO2:  93% 94% 95%  Weight: (!) 170.1 kg (375 lb)     Height:  Intake/Output Summary (Last 24 hours) at 03/17/2018 0841 Last data filed at 03/17/2018 0600 Gross per 24 hour  Intake 2372.67 ml  Output 1750 ml  Net 622.67 ml   Filed Weights   03/15/18 0412 03/16/18 0500 03/17/18 0528  Weight: (!) 162.9 kg (359 lb 2.1 oz) (!) 169.1 kg (372 lb 12.8 oz) (!) 170.1 kg  (375 lb)    Examination:  General exam: Appears calm and comfortable Respiratory system: Diminished bilaterally secondary to body habitus. Respiratory effort normal. Cardiovascular system: S1 & S2 heard, RRR. No murmurs. Diminished Gastrointestinal system: Abdomen is nondistended, soft and nontender. Normal bowel sounds heard. Central nervous system: Alert and oriented. No focal neurological deficits. Extremities: Pitting edema bilaterally. No calf tenderness Skin: Right leg: erythema improved around lower leg, but patient has some persistent erythema of her lower thigh medially that extends posteriorly; tenderness over medial thigh and lower leg. Psychiatry: Judgement and insight appear normal. Mood & affect appropriate.     Data Reviewed: I have personally reviewed following labs and imaging studies  CBC: Recent Labs  Lab 03/14/18 2231 03/15/18 0338 03/16/18 0319 03/17/18 0308  WBC 12.6* 9.4 4.7 6.5  NEUTROABS 11.4*  --   --   --   HGB 14.2 13.6 12.3 12.4  HCT 40.9 40.9 37.3 36.8  MCV 89.3 93.0 92.6 90.9  PLT 32* 24* 17* 28*   Basic Metabolic Panel: Recent Labs  Lab 03/14/18 2231 03/15/18 0335 03/16/18 0319 03/17/18 0308  NA 134* 139 135 134*  K 3.5 3.9 3.8 4.2  CL 102 107 107 103  CO2 19* 20* 22 21*  GLUCOSE 164* 144* 131* 131*  BUN 25* 27* 35* 37*  CREATININE 1.15* 1.15* 0.95 0.97  CALCIUM 8.9 8.9 8.6* 8.8*  MG  --  1.4* 2.3  --   PHOS  --  3.5 3.0  --    GFR: Estimated Creatinine Clearance: 99.7 mL/min (by C-G formula based on SCr of 0.97 mg/dL). Liver Function Tests: Recent Labs  Lab 03/14/18 2231 03/16/18 0319  AST 59* 65*  ALT 25 36  ALKPHOS 78 47  BILITOT 5.0* 6.0*  6.0*  PROT 6.7 6.2*  ALBUMIN 3.3* 3.0*   No results for input(s): LIPASE, AMYLASE in the last 168 hours. No results for input(s): AMMONIA in the last 168 hours. Coagulation Profile: No results for input(s): INR, PROTIME in the last 168 hours. Cardiac Enzymes: No results for  input(s): CKTOTAL, CKMB, CKMBINDEX, TROPONINI in the last 168 hours. BNP (last 3 results) No results for input(s): PROBNP in the last 8760 hours. HbA1C: No results for input(s): HGBA1C in the last 72 hours. CBG: Recent Labs  Lab 03/16/18 0738 03/16/18 1150 03/16/18 1701 03/16/18 2151 03/17/18 0726  GLUCAP 147* 151* 149* 130* 132*   Lipid Profile: No results for input(s): CHOL, HDL, LDLCALC, TRIG, CHOLHDL, LDLDIRECT in the last 72 hours. Thyroid Function Tests: No results for input(s): TSH, T4TOTAL, FREET4, T3FREE, THYROIDAB in the last 72 hours. Anemia Panel: Recent Labs    03/14/18 2231  RETICCTPCT 3.9*   Sepsis Labs: Recent Labs  Lab 03/14/18 2244 03/15/18 0022 03/15/18 0335 03/15/18 0547 03/16/18 0319 03/17/18 0308  PROCALCITON  --   --  39.08  --  27.30 19.21  LATICACIDVEN 4.76* 5.00* 3.8* 3.4*  --   --     Recent Results (from the past 240 hour(s))  Blood culture (routine x 2)     Status: None (Preliminary result)   Collection Time: 03/14/18 10:30 PM  Result  Value Ref Range Status   Specimen Description   Final    BLOOD BLOOD LEFT ARM Performed at [facility], [address] [place], [place] [address]    Special Requests   Final    BOTTLES DRAWN AEROBIC AND ANAEROBIC Blood Culture results may not be optimal due to an inadequate volume of blood received in culture bottles Performed at [facility], [address] [place], [place] [address]    Culture   Final    NO GROWTH 1 DAY Performed at [facility], [place] [address], [place], [place] [address]    Report Status PENDING  Incomplete  MRSA PCR Screening     Status: None   Collection Time: 03/15/18  2:39 AM  Result Value Ref Range Status   MRSA by PCR NEGATIVE NEGATIVE Final    Comment:        The GeneXpert MRSA Assay (FDA approved for NASAL specimens only), is one component of a comprehensive MRSA colonization surveillance program. It is not intended to diagnose  MRSA infection nor to guide or monitor treatment for MRSA infections. Performed at [facility], [place] [address] [place], [place] [address]   Culture, blood (routine x 2)     Status: None (Preliminary result)   Collection Time: 03/15/18  3:35 AM  Result Value Ref Range Status   Specimen Description   Final    BLOOD RIGHT ANTECUBITAL Performed at [place] [address] [place], [place] [address]    Special Requests   Final    BOTTLES DRAWN AEROBIC ONLY Blood Culture adequate volume   Culture   Final    NO GROWTH 1 DAY Performed at [facility], [place] [address], [place], [place] [address]    Report Status PENDING  Incomplete  Culture, blood (routine x 2)     Status: None (Preliminary result)   Collection Time: 03/15/18  3:35 AM  Result Value Ref Range Status   Specimen Description   Final    BLOOD RIGHT HAND Performed at [place] [address] [place], [place] [address]    Special Requests   Final    BOTTLES DRAWN AEROBIC ONLY Blood Culture adequate volume   Culture   Final    NO GROWTH 1 DAY Performed at [facility], [place] [address], [place], [place] [address]    Report Status PENDING  Incomplete         Radiology Studies: Ct Tibia Fibula Right W Contrast  Result Date: 03/16/2018 CLINICAL DATA:  Right lower leg cellulitis. EXAM: CT OF THE LOWER RIGHT EXTREMITY WITH CONTRAST TECHNIQUE: Multidetector CT imaging of the right tibia and fibula was performed according to the standard protocol following intravenous contrast administration. COMPARISON:  Right tibia fibula x-rays dated March 14, 2018. CONTRAST:  174m ISOVUE-300 IOPAMIDOL (ISOVUE-300) INJECTION 61% FINDINGS: Bones/Joint/Cartilage No acute fracture or dislocation. No cortical destruction or osteolysis. Mild knee osteoarthritis. No knee or tibiotalar joint effusion. Ligaments Suboptimally assessed by CT. Muscles and Tendons No significant  muscle atrophy. The flexor, extensor, peroneal, and Achilles tendons are grossly intact. Soft tissues Moderate soft tissue swelling involving the entire lateral lower leg. More circumferential moderate soft tissue swelling and skin thickening of the mid to distal lower leg. No fluid collection or soft tissue mass. IMPRESSION: 1. Moderate circumferential soft tissue swelling and skin thickening of the mid to distal lower leg, consistent with clinical history of  cellulitis. No abscess. 2.  No acute osseous abnormality. Electronically Signed   By: Titus Dubin M.D.   On: 03/16/2018 13:29   Dg Chest Port 1 View  Result Date: 03/16/2018 CLINICAL DATA:  Hypoxia. EXAM: PORTABLE CHEST 1 VIEW COMPARISON:  Radiographs of March 14, 2018. FINDINGS: Stable cardiomegaly. No pneumothorax is noted. Mild central pulmonary vascular congestion is noted. No consolidative process is noted. No significant pleural effusion is noted. Bony thorax is unremarkable. IMPRESSION: Stable cardiomegaly with mild central pulmonary vascular congestion. Electronically Signed   By: Marijo Conception, M.D.   On: 03/16/2018 07:16        Scheduled Meds: . diltiazem  60 mg Oral Q6H  . docusate sodium  100 mg Oral BID  . insulin aspart  0-20 Units Subcutaneous TID WC  . insulin aspart  0-5 Units Subcutaneous QHS  . nystatin   Topical BID  . pantoprazole  40 mg Oral Daily  . propranolol  20 mg Oral TID  . senna  1 tablet Oral QHS   Continuous Infusions: . sodium chloride 75 mL/hr at 03/17/18 0600  . sodium chloride 10 mL/hr at 03/17/18 0600  . piperacillin-tazobactam (ZOSYN)  IV 12.5 mL/hr at 03/17/18 0600  . vancomycin Stopped (03/16/18 1928)     LOS: 3 days     Cordelia Poche, MD [facility] 03/17/2018, 8:41 AM Pager: [phone]  If 7PM-7AM, please contact night-coverage [email or web address] 03/17/2018, 8:41 AM

## 2018-03-17 NOTE — Progress Notes (Signed)
*  Preliminary Results* Bilateral lower extremity venous duplex completed. There is no obvious evidence of acute deep vein thrombosis involving visualized veins of bilateral lower extremities. There is no evidence of Baker's cyst bilaterally.  03/17/2018 3:33 PM Maudry Mayhew, BS, RVT, RDCS, RDMS

## 2018-03-17 NOTE — Progress Notes (Signed)
Pt called out on call bell and was coughing and yelled "I'm choking on my food". RN to bedside, helped pt sit up, suctioned mouth. O2 sats remained in the mid 90s. [place] a 2L applied. Albuterol breathing tx given. Nettey MD paged. No new orders to given at this time. Will hold off on transfer to make sure pts respiratory status does not decrease. Will continue to monitor.

## 2018-03-18 DIAGNOSIS — Z886 Allergy status to analgesic agent status: Secondary | ICD-10-CM

## 2018-03-18 DIAGNOSIS — Z888 Allergy status to other drugs, medicaments and biological substances status: Secondary | ICD-10-CM

## 2018-03-18 DIAGNOSIS — Z87891 Personal history of nicotine dependence: Secondary | ICD-10-CM

## 2018-03-18 DIAGNOSIS — R112 Nausea with vomiting, unspecified: Secondary | ICD-10-CM

## 2018-03-18 DIAGNOSIS — Z91013 Allergy to seafood: Secondary | ICD-10-CM

## 2018-03-18 DIAGNOSIS — Z872 Personal history of diseases of the skin and subcutaneous tissue: Secondary | ICD-10-CM

## 2018-03-18 DIAGNOSIS — I10 Essential (primary) hypertension: Secondary | ICD-10-CM

## 2018-03-18 DIAGNOSIS — R42 Dizziness and giddiness: Secondary | ICD-10-CM

## 2018-03-18 DIAGNOSIS — Z91041 Radiographic dye allergy status: Secondary | ICD-10-CM

## 2018-03-18 LAB — BASIC METABOLIC PANEL
Anion gap: 6 (ref 5–15)
BUN: 29 mg/dL — AB (ref 8–23)
CALCIUM: 8.7 mg/dL — AB (ref 8.9–10.3)
CHLORIDE: 104 mmol/L (ref 98–111)
CO2: 24 mmol/L (ref 22–32)
CREATININE: 0.74 mg/dL (ref 0.44–1.00)
GFR calc Af Amer: >60 mL/min (ref >60)
GFR calc non Af Amer: >60 mL/min (ref >60)
GLUCOSE: 132 mg/dL — AB (ref 70–99)
Potassium: 4 mmol/L (ref 3.5–5.1)
Sodium: 134 mmol/L — ABNORMAL LOW (ref 135–145)

## 2018-03-18 LAB — HEPATIC FUNCTION PANEL
ALBUMIN: 2.8 g/dL — AB (ref 3.5–5.0)
ALK PHOS: 52 U/L (ref 38–126)
ALT: 28 U/L (ref 0–44)
AST: 38 U/L (ref 15–41)
BILIRUBIN DIRECT: 0.9 mg/dL — AB (ref 0.0–0.2)
BILIRUBIN TOTAL: 3.6 mg/dL — AB (ref 0.3–1.2)
Indirect Bilirubin: 2.7 mg/dL — ABNORMAL HIGH (ref 0.3–0.9)
Total Protein: 6.2 g/dL — ABNORMAL LOW (ref 6.5–8.1)

## 2018-03-18 LAB — CBC
HEMATOCRIT: 37.1 % (ref 36.0–46.0)
HEMOGLOBIN: 12.7 g/dL (ref 12.0–15.0)
MCH: 30.5 pg (ref 26.0–34.0)
MCHC: 34.2 g/dL (ref 30.0–36.0)
MCV: 89.2 fL (ref 78.0–100.0)
Platelets: 32 10*3/uL — ABNORMAL LOW (ref 150–400)
RBC: 4.16 MIL/uL (ref 3.87–5.11)
RDW: 16.1 % — AB (ref 11.5–15.5)
WBC: 8.1 10*3/uL (ref 4.0–10.5)

## 2018-03-18 LAB — GLUCOSE, CAPILLARY
GLUCOSE-CAPILLARY: 130 mg/dL — AB (ref 70–99)
GLUCOSE-CAPILLARY: 113 mg/dL — AB (ref 70–99)
Glucose-Capillary: 128 mg/dL — ABNORMAL HIGH (ref 70–99)
Glucose-Capillary: 149 mg/dL — ABNORMAL HIGH (ref 70–99)

## 2018-03-18 LAB — HAPTOGLOBIN: HAPTOGLOBIN: 24 mg/dL — AB (ref 34–200)

## 2018-03-18 MED ORDER — FUROSEMIDE 10 MG/ML IJ SOLN
40.0000 mg | Freq: Once | INTRAMUSCULAR | Status: AC
  Administered 2018-03-18: 40 mg via INTRAVENOUS
  Filled 2018-03-18: qty 4

## 2018-03-18 MED ORDER — CEFAZOLIN SODIUM-DEXTROSE 2-4 GM/100ML-% IV SOLN
2.0000 g | Freq: Three times a day (TID) | INTRAVENOUS | Status: DC
  Administered 2018-03-18 – 2018-03-23 (×14): 2 g via INTRAVENOUS
  Filled 2018-03-18 (×16): qty 100

## 2018-03-18 MED ORDER — HYDROMORPHONE HCL 1 MG/ML IJ SOLN: 0.5000 mg | Freq: Once | INTRAMUSCULAR | Status: DC | PRN

## 2018-03-18 NOTE — Plan of Care (Signed)
Patient stable during 7 a to 7 p shift, medicated for pain in leg x 2 with improvement.  Patient up to chair for most of shift.  Pt. Diuresed large amount of urine after receiving IV lasix.

## 2018-03-18 NOTE — Progress Notes (Signed)
PROGRESS NOTE    Hannah Khan  QIO:[id number] DOB: 1956/07/26 DOA: 03/14/2018 PCP: Lujean Amel, MD   Brief Narrative: Hannah Khan is a 62 y.o. female with a history of NASH cirrhosis, hypertension, diabetes mellitus type 2, atrial fibrillation, pancytopenia, ITP, morbid obesity.  Patient presented secondary to dyspnea and right lower extremity pain.  She was found to have cellulitis of her right lower extremity in addition to meeting sepsis criteria and having atrial fibrillation with RVR.  Patient has been managed with IV fluids, IV antibiotics, diltiazem drip.  Blood pressure is improved but patient is still in RVR.  Cardiology is consulted for help managing A. fib.  Patient also with decreased platelets in setting of acute infection.  Medical oncology consulted.   Assessment & Plan:   Principal Problem:   Severe sepsis [facility]) Active Problems:   Essential hypertension   Cirrhosis of liver, with portal HTN & Acites, by CT Abdomen 07/20/13   Atrial fibrillation with rapid ventricular response (HCC)   Permanent atrial fibrillation (HCC)   Type 2 diabetes mellitus without complication, without long-term current use of insulin (HCC)   Chronic diastolic CHF (congestive heart failure) (HCC)   Thrombocytopenia (HCC)   Cellulitis of right lower extremity   Severe sepsis Secondary to cellulitis of her right leg.  Concern for bacteremia.  No abscess on CT scan.  Hypotension has resolved with IV antibiotics and initial fluid resuscitation. Procalcitonin trended down. Complicated by atrial fibrillation with RVR.  Cellulitis of the right lower leg No abscess. Blood cultures without results to date. Slightly worsened today. Venous duplex negative for DVT -Continue vancomycin and Zosyn -Infectious disease consulted -Continue to follow blood cultures -Will diurese patient to see if it improves (although increased swelling is unilateral)  Permanent atrial fibrillation with RVR In setting  of acute infection.  Cardiology consulted. Transitioned to Cardizem PO and continued on propranolol.  Pancytopenia ITP Patient follows with Dr. Alen Blew as an outpatient.  Platelets of around 30,000 on admission.  Stable.  NASH cirrhosis AST and ALT are not significantly elevated in setting of cirrhosis.  Bilirubin is up from baseline but now trending down. Weight is up significantly -Fluid restricted diet -Diuresis as mentioned above  Hyperbilirubinemia Mostly indirect. LDH mildly elevated. Hemoglobin stable. Bilirubin improved. -Hepatoglobin pending  Diabetes mellitus, type 2 -Continue sliding scale insulin  Essential hypertension Patient is on diltiazem and propranolol as an outpatient.  Pressure has been low in setting of acute infection.  We will manage blood pressure as needed.  Chronic pain -Continue oxycodone  Morbid obesity Body mass index is 59.29 kg/m.    DVT prophylaxis: SCD Code Status:   Code Status: Full Code Family Communication: Husband at bedside Disposition Plan: Transfer to telemetry today   Consultants:   Critical care medicine  Cardiology  Medical oncology  Procedures:   None  Antimicrobials:  Vancomycin (6/26>>  Zosyn (6/26>>   Subjective: Right leg with increased pain and swelling.  Objective: Vitals:   03/18/18 0042 03/18/18 0500 03/18/18 0507 03/18/18 0555  BP: 120/72 124/60    Pulse: 77 67    Resp: 19 19    Temp: 97.8 F (36.6 C) 98.7 F (37.1 C)    TempSrc: Oral Oral    SpO2:   97% 98%  Weight:   (!) 171.7 kg (378 lb 8.5 oz)   Height:        Intake/Output Summary (Last 24 hours) at 03/18/2018 1316 Last data filed at 03/18/2018 0600 Gross per 24  hour  Intake 2031.67 ml  Output 201 ml  Net 1830.67 ml   Filed Weights   03/16/18 0500 03/17/18 0528 03/18/18 0507  Weight: (!) 169.1 kg (372 lb 12.8 oz) (!) 170.1 kg (375 lb) (!) 171.7 kg (378 lb 8.5 oz)    Examination:  General exam: Appears calm and  comfortable Respiratory system: Diminished breath sounds. Respiratory effort normal. Cardiovascular system: S1 & S2 heard, RRR. No murmurs, rubs, gallops or clicks. Gastrointestinal system: Abdomen is nondistended, soft and nontender. No organomegaly or masses felt. Normal bowel sounds heard. Central nervous system: Alert and oriented. No focal neurological deficits. Extremities: Bilateral LE edema with significantly more edema of right leg. No calf tenderness Skin: No cyanosis. Right leg with erythema tracking up medial and posterior thigh; tender. Psychiatry: Judgement and insight appear normal. Mood & affect appropriate.     Data Reviewed: I have personally reviewed following labs and imaging studies  CBC: Recent Labs  Lab 03/14/18 2231 03/15/18 0338 03/16/18 0319 03/17/18 0308 03/18/18 0505  WBC 12.6* 9.4 4.7 6.5 8.1  NEUTROABS 11.4*  --   --   --   --   HGB 14.2 13.6 12.3 12.4 12.7  HCT 40.9 40.9 37.3 36.8 37.1  MCV 89.3 93.0 92.6 90.9 89.2  PLT 32* 24* 17* 28* 32*   Basic Metabolic Panel: Recent Labs  Lab 03/14/18 2231 03/15/18 0335 03/16/18 0319 03/17/18 0308 03/18/18 0505  NA 134* 139 135 134* 134*  K 3.5 3.9 3.8 4.2 4.0  CL 102 107 107 103 104  CO2 19* 20* 22 21* 24  GLUCOSE 164* 144* 131* 131* 132*  BUN 25* 27* 35* 37* 29*  CREATININE 1.15* 1.15* 0.95 0.97 0.74  CALCIUM 8.9 8.9 8.6* 8.8* 8.7*  MG  --  1.4* 2.3  --   --   PHOS  --  3.5 3.0  --   --    GFR: Estimated Creatinine Clearance: 121.6 mL/min (by C-G formula based on SCr of 0.74 mg/dL). Liver Function Tests: Recent Labs  Lab 03/14/18 2231 03/16/18 0319 03/18/18 0505  AST 59* 65* 38  ALT 25 36 28  ALKPHOS 78 47 52  BILITOT 5.0* 6.0*  6.0* 3.6*  PROT 6.7 6.2* 6.2*  ALBUMIN 3.3* 3.0* 2.8*   No results for input(s): LIPASE, AMYLASE in the last 168 hours. No results for input(s): AMMONIA in the last 168 hours. Coagulation Profile: No results for input(s): INR, PROTIME in the last 168  hours. Cardiac Enzymes: No results for input(s): CKTOTAL, CKMB, CKMBINDEX, TROPONINI in the last 168 hours. BNP (last 3 results) No results for input(s): PROBNP in the last 8760 hours. HbA1C: No results for input(s): HGBA1C in the last 72 hours. CBG: Recent Labs  Lab 03/17/18 1212 03/17/18 1635 03/17/18 2154 03/18/18 0804 03/18/18 1202  GLUCAP 107* 120* 110* 130* 128*   Lipid Profile: No results for input(s): CHOL, HDL, LDLCALC, TRIG, CHOLHDL, LDLDIRECT in the last 72 hours. Thyroid Function Tests: No results for input(s): TSH, T4TOTAL, FREET4, T3FREE, THYROIDAB in the last 72 hours. Anemia Panel: No results for input(s): VITAMINB12, FOLATE, FERRITIN, TIBC, IRON, RETICCTPCT in the last 72 hours. Sepsis Labs: Recent Labs  Lab 03/14/18 2244 03/15/18 0022 03/15/18 0335 03/15/18 0547 03/16/18 0319 03/17/18 0308  PROCALCITON  --   --  39.08  --  27.30 19.21  LATICACIDVEN 4.76* 5.00* 3.8* 3.4*  --   --     Recent Results (from the past 240 hour(s))  Blood culture (routine x 2)  Status: None (Preliminary result)   Collection Time: 03/14/18 10:30 PM  Result Value Ref Range Status   Specimen Description   Final    BLOOD BLOOD LEFT ARM Performed at [facility], [address] [place], [place] [address]    Special Requests   Final    BOTTLES DRAWN AEROBIC AND ANAEROBIC Blood Culture results may not be optimal due to an inadequate volume of blood received in culture bottles Performed at [facility], [address] [place], [place] [address]    Culture   Final    NO GROWTH 2 DAYS Performed at [facility], [place] [address] [place], [place] [address]    Report Status PENDING  Incomplete  MRSA PCR Screening     Status: None   Collection Time: 03/15/18  2:39 AM  Result Value Ref Range Status   MRSA by PCR NEGATIVE NEGATIVE Final    Comment:        The GeneXpert MRSA Assay (FDA approved for NASAL specimens only), is one component of  a comprehensive MRSA colonization surveillance program. It is not intended to diagnose MRSA infection nor to guide or monitor treatment for MRSA infections. Performed at [facility], [place] [address], [place], [place] [address]   Culture, blood (routine x 2)     Status: None (Preliminary result)   Collection Time: 03/15/18  3:35 AM  Result Value Ref Range Status   Specimen Description   Final    BLOOD RIGHT ANTECUBITAL Performed at [place] [address] [place], [place] [address]    Special Requests   Final    BOTTLES DRAWN AEROBIC ONLY Blood Culture adequate volume   Culture   Final    NO GROWTH 2 DAYS Performed at [facility], [place] [address] [place], [place] [address]    Report Status PENDING  Incomplete  Culture, blood (routine x 2)     Status: None (Preliminary result)   Collection Time: 03/15/18  3:35 AM  Result Value Ref Range Status   Specimen Description   Final    BLOOD RIGHT HAND Performed at [place] [address] [place], [place] [address]    Special Requests   Final    BOTTLES DRAWN AEROBIC ONLY Blood Culture adequate volume   Culture   Final    NO GROWTH 2 DAYS Performed at [facility], [place] [address] [place], [place] [address]    Report Status PENDING  Incomplete  Urine Culture     Status: Abnormal   Collection Time: 03/16/18  4:49 AM  Result Value Ref Range Status   Specimen Description   Final    URINE, RANDOM Performed at [place] [address], [place], [place] [address]    Special Requests   Final    NONE Performed at [facility], [place] [address] [place],  [address]    Culture >=100,000 COLONIES/mL YEAST (A)  Final   Report Status 03/17/2018 FINAL  Final         Radiology Studies: No results found.      Scheduled Meds: . diltiazem  240 mg Oral Daily  . docusate sodium  100 mg Oral BID  . insulin  aspart  0-20 Units Subcutaneous TID WC  . insulin aspart  0-5 Units Subcutaneous QHS  . nystatin   Topical BID  . pantoprazole  40 mg Oral Daily  .  propranolol ER  80 mg Oral Daily  . senna  1 tablet Oral QHS   Continuous Infusions: . sodium chloride Stopped (03/18/18 0534)  . sodium chloride 10 mL/hr at 03/17/18 0600  . piperacillin-tazobactam (ZOSYN)  IV Stopped (03/18/18 0940)  . vancomycin Stopped (03/17/18 2037)     LOS: 4 days     Cordelia Poche, MD [facility] 03/18/2018, 1:16 PM Pager: [phone]  If 7PM-7AM, please contact night-coverage [email or web address] 03/18/2018, 1:16 PM

## 2018-03-18 NOTE — Progress Notes (Addendum)
Pharmacy Antibiotic Note  Hannah Khan is a 62 y.o. female admitted on 03/14/2018 with cellulitis.  Pharmacy was consulted for vancomycin and zosyn dosing.  Today, 03/18/2018 Day #4 antibiotics  Renal: SCr 0.74  Tmax = afebrile  Cultures pending  MRSA PCR - neg  Plan: D/C Vanc and Zosyn.  Change to Cefazolin per RX Cefazolin 2gm IV q8h (cellulitis in morbidly obese pt)  Height: 5' 7"  (170.2 cm) Weight: (!) 378 lb 8.5 oz (171.7 kg) IBW/kg (Calculated) : 61.6  Temp (24hrs), Avg:98 F (36.7 C), Min:97.4 F (36.3 C), Max:98.7 F (37.1 C)  Recent Labs  Lab 03/14/18 2231 03/14/18 2244 03/15/18 0022 03/15/18 0335 03/15/18 0338 03/15/18 0547 03/16/18 0319 03/17/18 0308 03/18/18 0505  WBC 12.6*  --   --   --  9.4  --  4.7 6.5 8.1  CREATININE 1.15*  --   --  1.15*  --   --  0.95 0.97 0.74  LATICACIDVEN  --  4.76* 5.00* 3.8*  --  3.4*  --   --   --     Estimated Creatinine Clearance: 121.6 mL/min (by C-G formula based on SCr of 0.74 mg/dL).    Allergies  Allergen Reactions  . Celecoxib Other (See Comments)    "speech slurred" with celebrex  . Shellfish Allergy Swelling and Rash    TINGLING AND SWELLING OF LIPS. LARGE WHELPS QUARTER-SIZE  . Barium-Containing Compounds Other (See Comments)    TACHYCARDIA  . Prednisone Other (See Comments)    TACHYCARDIA  . Statins Other (See Comments)    AGGRAVATED FIBROMYALGIA  . Ibuprofen Other (See Comments)    UNSPECIFIED SPECIFIC REACTION >> "DUE TO PLATELETS"  . Tylenol [Acetaminophen] Other (See Comments)    UNSPECIFIED SPECIFIC REACTION >> "DUE TO PLATELETS"   Antimicrobials this admission: 6/27 vanc>> 6/30 6/27 zosyn >> 6/30 6/27 nystatin >> 6/30 cefazolin >>  Dose adjustments this admission:  Microbiology results: 6/26 BCx2>> NG to date 6/27 UCx>> yeast 6/27 MRSA PCR neg  Thank you for allowing pharmacy to be a part of this patient's care.  Leone Haven, PharmD 03/18/2018 5:48 PM

## 2018-03-18 NOTE — Consult Note (Signed)
Date of Admission:  03/14/2018          Reason for Consult: Persistent cellulitis and morbidly obese patient    Referring Provider: Dr. Lonny Prude   Assessment:  1. Cellulitis 2. Morbid obesity 3. Prior hx of cellulitis  Plan:  1. Change to cefazolin 2. Ensure screening for HIV and viral hepatitides has been done 3. Elevate leg  Principal Problem:   Severe sepsis [facility]) Active Problems:   Essential hypertension   Cirrhosis of liver, with portal HTN & Acites, by CT Abdomen 07/20/13   Atrial fibrillation with rapid ventricular response (HCC)   Permanent atrial fibrillation (HCC)   Type 2 diabetes mellitus without complication, without long-term current use of insulin (HCC)   Chronic diastolic CHF (congestive heart failure) (HCC)   Thrombocytopenia (HCC)   Cellulitis of right lower extremity   Scheduled Meds: . diltiazem  240 mg Oral Daily  . docusate sodium  100 mg Oral BID  . insulin aspart  0-20 Units Subcutaneous TID WC  . insulin aspart  0-5 Units Subcutaneous QHS  . nystatin   Topical BID  . pantoprazole  40 mg Oral Daily  . propranolol ER  80 mg Oral Daily  . senna  1 tablet Oral QHS   Continuous Infusions: . sodium chloride 10 mL/hr at 03/17/18 0600  . piperacillin-tazobactam (ZOSYN)  IV 3.375 g (03/18/18 1413)  . vancomycin Stopped (03/17/18 2037)   PRN Meds:.acetaminophen **OR** acetaminophen, albuterol, fentaNYL (SUBLIMAZE) injection, iopamidol, [DISCONTINUED] ondansetron **OR** ondansetron (ZOFRAN) IV, oxyCODONE  HPI: Hannah Khan is a 62 y.o. female significant for morbid obesity non-alcoholic steatohepatitis with cirrhosis hypertension atrial fibrillation, with chronic thrombocytopenia prior episodes of cellulitis who developed erythema in her right lower extremity along with systemic symptoms of nausea and vomiting.  Erythema extended more proximally up to the thigh and she ultimately sought care at the emergency department at [facility]  having been transferred to [facility] in the interim.  She did not have a purulent cellulitis and no history of pseudomonal infection or MRSA infection that I can discern but she was placed on unnecessarily broad-spectrum antibiotics in the form of vancomycin and Zosyn.  Initially she had improvement in the area of erythema regressing but now this has progressed in the last few days.  This does coincide with what sounds like increased physical activity of the patient trying to walk about where she experiences extreme pain in her ankle.  She is under he is minimizing that she is increase her physical activity but I suspect this is behind the fact that the erythema has progressed slightly beyond the lines drawn on her leg.  She is certainly not elevating her leg above her heart when I examined her in the room today.  She does not have any clear-cut history to suggest infectious risk for Pseudomonas or Aeromonas hydrophilic or Vibrio vulnificus.  I think she should be narrowed to cefazolin and have her leg elevated.  She is already had a CT scan that is not shown evidence of an abscess.  If she fails to improve and there is evidence on exam for an abscess or high suspicion will be reasonable to repeat imaging.  She states she cannot tolerate an MRI to her chronic low back pain.     Review of Systems: Review of Systems  Constitutional: Positive for diaphoresis and malaise/fatigue. Negative for chills, fever and weight loss.  HENT: Negative for congestion, hearing loss, sore throat and tinnitus.  Eyes: Negative for blurred vision and double vision.  Respiratory: Negative for cough, sputum production, shortness of breath and wheezing.   Cardiovascular: Positive for leg swelling. Negative for chest pain and palpitations.  Gastrointestinal: Positive for nausea and vomiting. Negative for abdominal pain, blood in stool, constipation, diarrhea, heartburn and melena.  Genitourinary: Negative for dysuria,  flank pain and hematuria.  Musculoskeletal: Negative for back pain, falls, joint pain and myalgias.  Skin: Negative for itching and rash.  Neurological: Positive for dizziness. Negative for sensory change, focal weakness, loss of consciousness, weakness and headaches.  Endo/Heme/Allergies: Does not bruise/bleed easily.  Psychiatric/Behavioral: Negative for depression, memory loss and suicidal ideas. The patient is not nervous/anxious.     Past Medical History:  Diagnosis Date  . Anemia    hx of  . Arthritis    knees  . Diabetes mellitus without complication ([place])    type 2  . Fibromyalgia   . HTN (hypertension)   . Hx of colonic polyps    s/p partial colectomy  . Hyperlipidemia   . Joint pain    in knees and back spasms  . Left leg swelling    wear compression hose  . Liver cirrhosis secondary to NASH (nonalcoholic steatohepatitis) ([place]) dx nov 2014   had enlarged spleen also   . Low blood pressure    bp varies  . Morbid obesity ([place])   . Persistent atrial fibrillation ([place])   . Pneumonia 10/2016  . PONV (postoperative nausea and vomiting)    in past none recent  . Portal hypertension ([place])   . Sleep apnea    uses cpap setting of 14  . Spleen enlarged   . 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 [facility])     Social History   Tobacco Use  . Smoking status: Former Smoker    Packs/day: 1.00    Years: 3.00    Pack years: 3.00    Types: Cigarettes    Last attempt to quit: 09/19/1974    Years since quitting: 43.5  . Smokeless tobacco: Never Used  Substance Use Topics  . Alcohol use: Yes    Comment: occ  . Drug use: No    Family History  Problem Relation Age of Onset  . Heart failure Mother   . Cancer Mother   . Hyperlipidemia Mother   . Congestive Heart Failure Mother   . Stroke Mother   . Lung cancer Father   . Cancer Father   . Cancer Brother   . Hyperlipidemia Brother   . Stroke Other    Allergies  Allergen Reactions  . Celecoxib Other (See Comments)     "speech slurred" with celebrex  . Shellfish Allergy Swelling and Rash    TINGLING AND SWELLING OF LIPS. LARGE WHELPS QUARTER-SIZE  . Barium-Containing Compounds Other (See Comments)    TACHYCARDIA  . Prednisone Other (See Comments)    TACHYCARDIA  . Statins Other (See Comments)    AGGRAVATED FIBROMYALGIA  . Ibuprofen Other (See Comments)    UNSPECIFIED SPECIFIC REACTION >> "DUE TO PLATELETS"  . Tylenol [Acetaminophen] Other (See Comments)    UNSPECIFIED SPECIFIC REACTION >> "DUE TO PLATELETS"    OBJECTIVE: Blood pressure 132/68, pulse 88, temperature (!) 97.4 F (36.3 C), temperature source Oral, resp. rate 20, height 5' 7"  (1.702 m), weight (!) 378 lb 8.5 oz (171.7 kg), SpO2 96 %.  Physical Exam  Constitutional: She is oriented to person, place, and time. No distress.  HENT:  Head: Normocephalic and atraumatic.  Right  Ear: External ear normal.  Left Ear: External ear normal.  Nose: Nose normal.  Mouth/Throat: Oropharynx is clear and moist. No oropharyngeal exudate.  Eyes: Pupils are equal, round, and reactive to light. Conjunctivae and EOM are normal. No scleral icterus.  Neck: Normal range of motion. Neck supple.  Cardiovascular: Normal rate, regular rhythm and normal heart sounds. Exam reveals no gallop and no friction rub.  No murmur heard. Pulmonary/Chest: Effort normal and breath sounds normal. No respiratory distress. She has no wheezes. She has no rales.  Abdominal: Soft. Bowel sounds are normal. She exhibits no distension. There is no tenderness. There is no rebound.  Musculoskeletal: Normal range of motion. She exhibits no edema or tenderness.  Lymphadenopathy:    She has no cervical adenopathy.  Neurological: She is alert and oriented to person, place, and time. Coordination normal.  Skin: Skin is warm and dry. No rash noted. She is not diaphoretic. There is erythema. No pallor.  Psychiatric: She has a normal mood and affect. Her behavior is normal. Judgment and  thought content normal.   Left leg 03/18/2018        Lab Results Lab Results  Component Value Date   WBC 8.1 03/18/2018   HGB 12.7 03/18/2018   HCT 37.1 03/18/2018   MCV 89.2 03/18/2018   PLT 32 (L) 03/18/2018    Lab Results  Component Value Date   CREATININE 0.74 03/18/2018   BUN 29 (H) 03/18/2018   NA 134 (L) 03/18/2018   K 4.0 03/18/2018   CL 104 03/18/2018   CO2 24 03/18/2018    Lab Results  Component Value Date   ALT 28 03/18/2018   AST 38 03/18/2018   ALKPHOS 52 03/18/2018   BILITOT 3.6 (H) 03/18/2018     Microbiology: Recent Results (from the past 240 hour(s))  Blood culture (routine x 2)     Status: None (Preliminary result)   Collection Time: 03/14/18 10:30 PM  Result Value Ref Range Status   Specimen Description   Final    BLOOD BLOOD LEFT ARM Performed at [facility], [address] [place], [place] [address]    Special Requests   Final    BOTTLES DRAWN AEROBIC AND ANAEROBIC Blood Culture results may not be optimal due to an inadequate volume of blood received in culture bottles Performed at [facility], [address] [place], [place] [address]    Culture   Final    NO GROWTH 3 DAYS Performed at [facility], [place] [address] [place], [place] [address]    Report Status PENDING  Incomplete  MRSA PCR Screening     Status: None   Collection Time: 03/15/18  2:39 AM  Result Value Ref Range Status   MRSA by PCR NEGATIVE NEGATIVE Final    Comment:        The GeneXpert MRSA Assay (FDA approved for NASAL specimens only), is one component of a comprehensive MRSA colonization surveillance program. It is not intended to diagnose MRSA infection nor to guide or monitor treatment for MRSA infections. Performed at [facility], [place] [address], [place], [place] [address]   Culture, blood (routine x 2)     Status: None (Preliminary result)   Collection Time: 03/15/18  3:35 AM  Result Value Ref Range  Status   Specimen Description   Final    BLOOD RIGHT ANTECUBITAL Performed at [facility] [address] [place], [place] [address]  Special Requests   Final    BOTTLES DRAWN AEROBIC ONLY Blood Culture adequate volume   Culture   Final    NO GROWTH 3 DAYS Performed at [facility], [place] [address] [place], [place] [address]    Report Status PENDING  Incomplete  Culture, blood (routine x 2)     Status: None (Preliminary result)   Collection Time: 03/15/18  3:35 AM  Result Value Ref Range Status   Specimen Description   Final    BLOOD RIGHT HAND Performed at [facility] [address], [place], [place] [address]    Special Requests   Final    BOTTLES DRAWN AEROBIC ONLY Blood Culture adequate volume   Culture   Final    NO GROWTH 3 DAYS Performed at [facility], [place] [address], [place], [place] [address]    Report Status PENDING  Incomplete  Urine Culture     Status: Abnormal   Collection Time: 03/16/18  4:49 AM  Result Value Ref Range Status   Specimen Description   Final    URINE, RANDOM Performed at [facility] [address], [place], [place] [address]    Special Requests   Final    NONE Performed at [facility], [place] [address] [place], [place] [address]    Culture >=100,000 COLONIES/mL YEAST (A)  Final   Report Status 03/17/2018 FINAL  Final    Alcide Evener, [place] [facility] [phone] pager  03/18/2018, 5:16 PM

## 2018-03-19 DIAGNOSIS — E119 Type 2 diabetes mellitus without complications: Secondary | ICD-10-CM

## 2018-03-19 DIAGNOSIS — K7581 Nonalcoholic steatohepatitis (NASH): Secondary | ICD-10-CM

## 2018-03-19 DIAGNOSIS — K7460 Unspecified cirrhosis of liver: Secondary | ICD-10-CM

## 2018-03-19 DIAGNOSIS — B49 Unspecified mycosis: Secondary | ICD-10-CM

## 2018-03-19 DIAGNOSIS — E1169 Type 2 diabetes mellitus with other specified complication: Secondary | ICD-10-CM

## 2018-03-19 DIAGNOSIS — R443 Hallucinations, unspecified: Secondary | ICD-10-CM

## 2018-03-19 LAB — BASIC METABOLIC PANEL
Anion gap: 10 (ref 5–15)
BUN: 22 mg/dL (ref 8–23)
CALCIUM: 8.7 mg/dL — AB (ref 8.9–10.3)
CO2: 24 mmol/L (ref 22–32)
CREATININE: 0.66 mg/dL (ref 0.44–1.00)
Chloride: 100 mmol/L (ref 98–111)
GFR calc Af Amer: >60 mL/min (ref >60)
Glucose, Bld: 133 mg/dL — ABNORMAL HIGH (ref 70–99)
Potassium: 3.4 mmol/L — ABNORMAL LOW (ref 3.5–5.1)
Sodium: 134 mmol/L — ABNORMAL LOW (ref 135–145)

## 2018-03-19 LAB — GLUCOSE, CAPILLARY
GLUCOSE-CAPILLARY: 128 mg/dL — AB (ref 70–99)
GLUCOSE-CAPILLARY: 142 mg/dL — AB (ref 70–99)
GLUCOSE-CAPILLARY: 100 mg/dL — AB (ref 70–99)
Glucose-Capillary: 103 mg/dL — ABNORMAL HIGH (ref 70–99)

## 2018-03-19 LAB — CBC
HCT: 34.4 % — ABNORMAL LOW (ref 36.0–46.0)
Hemoglobin: 11.6 g/dL — ABNORMAL LOW (ref 12.0–15.0)
MCH: 29.9 pg (ref 26.0–34.0)
MCHC: 33.7 g/dL (ref 30.0–36.0)
MCV: 88.7 fL (ref 78.0–100.0)
PLATELETS: 32 10*3/uL — AB (ref 150–400)
RBC: 3.88 MIL/uL (ref 3.87–5.11)
RDW: 15.7 % — ABNORMAL HIGH (ref 11.5–15.5)
WBC: 8.2 10*3/uL (ref 4.0–10.5)

## 2018-03-19 LAB — HEPATIC FUNCTION PANEL
ALT: 22 U/L (ref 0–44)
AST: 28 U/L (ref 15–41)
Albumin: 2.7 g/dL — ABNORMAL LOW (ref 3.5–5.0)
Alkaline Phosphatase: 58 U/L (ref 38–126)
BILIRUBIN INDIRECT: 2 mg/dL — AB (ref 0.3–0.9)
Bilirubin, Direct: 0.6 mg/dL — ABNORMAL HIGH (ref 0.0–0.2)
Total Bilirubin: 2.6 mg/dL — ABNORMAL HIGH (ref 0.3–1.2)
Total Protein: 6.2 g/dL — ABNORMAL LOW (ref 6.5–8.1)

## 2018-03-19 MED ORDER — FLUCONAZOLE 100 MG PO TABS: 200.0000 mg | ORAL_TABLET | Freq: Every day | ORAL | Status: DC

## 2018-03-19 MED ORDER — FUROSEMIDE 10 MG/ML IJ SOLN
40.0000 mg | Freq: Two times a day (BID) | INTRAMUSCULAR | Status: DC
  Administered 2018-03-19 – 2018-03-23 (×9): 40 mg via INTRAVENOUS
  Filled 2018-03-19 (×9): qty 4

## 2018-03-19 MED ORDER — TRAZODONE HCL 100 MG PO TABS
100.0000 mg | ORAL_TABLET | Freq: Every evening | ORAL | Status: DC | PRN
  Administered 2018-03-20 – 2018-03-21 (×2): 100 mg via ORAL
  Filled 2018-03-19 (×2): qty 1

## 2018-03-19 MED ORDER — POLYETHYLENE GLYCOL 3350 17 G PO PACK
17.0000 g | PACK | Freq: Every day | ORAL | Status: DC
  Administered 2018-03-19 – 2018-03-23 (×5): 17 g via ORAL
  Filled 2018-03-19 (×5): qty 1

## 2018-03-19 MED ORDER — POTASSIUM CHLORIDE CRYS ER 20 MEQ PO TBCR
20.0000 meq | EXTENDED_RELEASE_TABLET | Freq: Once | ORAL | Status: AC
  Administered 2018-03-19: 20 meq via ORAL
  Filled 2018-03-19: qty 1

## 2018-03-19 NOTE — Progress Notes (Signed)
PROGRESS NOTE    CYSTAL SHANNAHAN  ZOX:[id number] DOB: 1956-03-20 DOA: 03/14/2018 PCP: Lujean Amel, MD   Brief Narrative: Hannah Khan is a 62 y.o. female with a history of NASH cirrhosis, hypertension, diabetes mellitus type 2, atrial fibrillation, pancytopenia, ITP, morbid obesity.  Patient presented secondary to dyspnea and right lower extremity pain.  She was found to have cellulitis of her right lower extremity in addition to meeting sepsis criteria and having atrial fibrillation with RVR.  Patient has been managed with IV fluids, IV antibiotics, diltiazem drip.  Blood pressure is improved but patient is still in RVR.  Cardiology is consulted for help managing A. fib.  Patient also with decreased platelets in setting of acute infection.  Medical oncology consulted.   Assessment & Plan:   Principal Problem:   Severe sepsis [facility]) Active Problems:   Essential hypertension   Cirrhosis of liver, with portal HTN & Acites, by CT Abdomen 07/20/13   Atrial fibrillation with rapid ventricular response (HCC)   Permanent atrial fibrillation (HCC)   Type 2 diabetes mellitus without complication, without long-term current use of insulin (HCC)   Chronic diastolic CHF (congestive heart failure) (HCC)   Thrombocytopenia (HCC)   Cellulitis of right lower extremity   Severe sepsis Secondary to cellulitis of her right leg.  Concern for bacteremia.  No abscess on CT scan.  Hypotension has resolved with IV antibiotics and initial fluid resuscitation. Procalcitonin trended down. Complicated by atrial fibrillation with RVR.  Cellulitis of the right lower leg No abscess. Blood cultures without results to date. Slightly worsened today. Venous duplex negative for DVT. Vancomycin and Zosyn discontinued and transitioned to Cefazolin -Infectious disease recommendations -Continue to follow blood cultures -Continue Lasix  Permanent atrial fibrillation with RVR In setting of acute infection.  Cardiology  consulted. Transitioned to Cardizem PO and continued on propranolol.  Pancytopenia ITP Patient follows with Dr. Alen Blew as an outpatient.  Platelets of around 30,000 on admission.  Stable.  NASH cirrhosis AST and ALT are not significantly elevated in setting of cirrhosis.  Bilirubin is up from baseline on admission but now trending down. Weight is up significantly. UOP of 2.6L over last 24 hours -Fluid restricted diet -Diuresis as mentioned above  Hyperbilirubinemia Mostly indirect. LDH mildly elevated. Hemoglobin stable. Bilirubin improved. Haptoglobin slightly low  Diabetes mellitus, type 2 -Continue sliding scale insulin  Essential hypertension Patient is on diltiazem and propranolol as an outpatient.  Pressure has been low in setting of acute infection.  Will manage blood pressure as needed.  Chronic pain -Continue oxycodone  Hallucinations Unknown etiology. Possibly related to fentanyl. Patient with yeast on urinalysis. No urinary symptoms but could possibly be contributing  Yeast UTI -Fluconazole  Morbid obesity Body mass index is 59.29 kg/m.   DVT prophylaxis: SCD Code Status:   Code Status: Full Code Family Communication: Husband at bedside Disposition Plan: Discharge pending clinical improvement   Consultants:   Critical care medicine  Cardiology  Medical oncology  Procedures:   None  Antimicrobials:  Vancomycin (6/26>>6/30)  Zosyn (6/26>>6/30)  Cefazolin (6/30>>   Subjective: Hallucinations today although she states she has had them for the past few days.  Objective: Vitals:   03/18/18 1410 03/18/18 2045 03/18/18 2251 03/19/18 0439  BP: 132/68 (!) 106/59  130/69  Pulse: 88 98  (!) 114  Resp: 20 20  18   Temp: (!) 97.4 F (36.3 C) 97.7 F (36.5 C)  98.1 F (36.7 C)  TempSrc: Oral Oral  Oral  SpO2:  96% 97% 98% 98%  Weight:      Height:        Intake/Output Summary (Last 24 hours) at 03/19/2018 1316 Last data filed at 03/19/2018  0902 Gross per 24 hour  Intake 300 ml  Output 3100 ml  Net -2800 ml   Filed Weights   03/16/18 0500 03/17/18 0528 03/18/18 0507  Weight: (!) 169.1 kg (372 lb 12.8 oz) (!) 170.1 kg (375 lb) (!) 171.7 kg (378 lb 8.5 oz)    Examination:  General exam: Appears calm and comfortable Respiratory system: Clear to auscultation. Respiratory effort normal. Cardiovascular system: S1 & S2 heard, RRR. No murmurs, rubs, gallops or clicks. Gastrointestinal system: Abdomen is nondistended, soft and nontender. No organomegaly or masses felt. Normal bowel sounds heard. Central nervous system: Alert and oriented. No focal neurological deficits. Extremities: 2+ edema of LE. No calf tenderness Skin: No cyanosis. Erythema with some bullae formation around inner lower thigh and associated tenderness. Erythema is significant and is from lower leg to lower thigh Psychiatry: Judgement and insight appear normal. Mood & affect appropriate. Hallucinations.    Data Reviewed: I have personally reviewed following labs and imaging studies  CBC: Recent Labs  Lab 03/14/18 2231 03/15/18 0338 03/16/18 0319 03/17/18 0308 03/18/18 0505 03/19/18 0529  WBC 12.6* 9.4 4.7 6.5 8.1 8.2  NEUTROABS 11.4*  --   --   --   --   --   HGB 14.2 13.6 12.3 12.4 12.7 11.6*  HCT 40.9 40.9 37.3 36.8 37.1 34.4*  MCV 89.3 93.0 92.6 90.9 89.2 88.7  PLT 32* 24* 17* 28* 32* 32*   Basic Metabolic Panel: Recent Labs  Lab 03/15/18 0335 03/16/18 0319 03/17/18 0308 03/18/18 0505 03/19/18 0529  NA 139 135 134* 134* 134*  K 3.9 3.8 4.2 4.0 3.4*  CL 107 107 103 104 100  CO2 20* 22 21* 24 24  GLUCOSE 144* 131* 131* 132* 133*  BUN 27* 35* 37* 29* 22  CREATININE 1.15* 0.95 0.97 0.74 0.66  CALCIUM 8.9 8.6* 8.8* 8.7* 8.7*  MG 1.4* 2.3  --   --   --   PHOS 3.5 3.0  --   --   --    GFR: Estimated Creatinine Clearance: 121.6 mL/min (by C-G formula based on SCr of 0.66 mg/dL). Liver Function Tests: Recent Labs  Lab 03/14/18 2231  03/16/18 0319 03/18/18 0505 03/19/18 1121  AST 59* 65* 38 28  ALT 25 36 28 22  ALKPHOS 78 47 52 58  BILITOT 5.0* 6.0*  6.0* 3.6* 2.6*  PROT 6.7 6.2* 6.2* 6.2*  ALBUMIN 3.3* 3.0* 2.8* 2.7*   No results for input(s): LIPASE, AMYLASE in the last 168 hours. No results for input(s): AMMONIA in the last 168 hours. Coagulation Profile: No results for input(s): INR, PROTIME in the last 168 hours. Cardiac Enzymes: No results for input(s): CKTOTAL, CKMB, CKMBINDEX, TROPONINI in the last 168 hours. BNP (last 3 results) No results for input(s): PROBNP in the last 8760 hours. HbA1C: No results for input(s): HGBA1C in the last 72 hours. CBG: Recent Labs  Lab 03/18/18 1202 03/18/18 1709 03/18/18 2101 03/19/18 0736 03/19/18 1132  GLUCAP 128* 149* 113* 128* 103*   Lipid Profile: No results for input(s): CHOL, HDL, LDLCALC, TRIG, CHOLHDL, LDLDIRECT in the last 72 hours. Thyroid Function Tests: No results for input(s): TSH, T4TOTAL, FREET4, T3FREE, THYROIDAB in the last 72 hours. Anemia Panel: No results for input(s): VITAMINB12, FOLATE, FERRITIN, TIBC, IRON, RETICCTPCT in the last 72 hours.  Sepsis Labs: Recent Labs  Lab 03/14/18 2244 03/15/18 0022 03/15/18 0335 03/15/18 0547 03/16/18 0319 03/17/18 0308  PROCALCITON  --   --  39.08  --  27.30 19.21  LATICACIDVEN 4.76* 5.00* 3.8* 3.4*  --   --     Recent Results (from the past 240 hour(s))  Blood culture (routine x 2)     Status: None (Preliminary result)   Collection Time: 03/14/18 10:30 PM  Result Value Ref Range Status   Specimen Description   Final    BLOOD BLOOD LEFT ARM Performed at [facility], [place] [place], [place] [address]    Special Requests   Final    BOTTLES DRAWN AEROBIC AND ANAEROBIC Blood Culture results may not be optimal due to an inadequate volume of blood received in culture bottles Performed at [facility], [place] [place], [place] [address]    Culture   Final      NO GROWTH 4 DAYS Performed at [facility], [place] [address], [place], [place] [address]    Report Status PENDING  Incomplete  MRSA PCR Screening     Status: None   Collection Time: 03/15/18  2:39 AM  Result Value Ref Range Status   MRSA by PCR NEGATIVE NEGATIVE Final    Comment:        The GeneXpert MRSA Assay (FDA approved for NASAL specimens only), is one component of a comprehensive MRSA colonization surveillance program. It is not intended to diagnose MRSA infection nor to guide or monitor treatment for MRSA infections. Performed at [facility], [place] [address], [place], [place] [address]   Culture, blood (routine x 2)     Status: None (Preliminary result)   Collection Time: 03/15/18  3:35 AM  Result Value Ref Range Status   Specimen Description   Final    BLOOD RIGHT ANTECUBITAL Performed at [facility] [address] [place], [place] [address]    Special Requests   Final    BOTTLES DRAWN AEROBIC ONLY Blood Culture adequate volume   Culture   Final    NO GROWTH 4 DAYS Performed at [facility], [place] [address] [place], [place] [address]    Report Status PENDING  Incomplete  Culture, blood (routine x 2)     Status: None (Preliminary result)   Collection Time: 03/15/18  3:35 AM  Result Value Ref Range Status   Specimen Description   Final    BLOOD RIGHT HAND Performed at [facility] [address] [place], [place] [address]    Special Requests   Final    BOTTLES DRAWN AEROBIC ONLY Blood Culture adequate volume   Culture   Final    NO GROWTH 4 DAYS Performed at [facility], [place] [address], [place], [place] [address]    Report Status PENDING  Incomplete  Urine Culture     Status: Abnormal   Collection Time: 03/16/18  4:49 AM  Result Value Ref Range Status   Specimen Description   Final    URINE, RANDOM Performed at [facility] [address]  [place], [place] [address]    Special Requests   Final    NONE Performed at [facility], [place] [address], [place], [place] [address]    Culture >=100,000 COLONIES/mL YEAST (A)  Final   Report Status 03/17/2018 FINAL  Final  Radiology Studies: No results found.      Scheduled Meds: . diltiazem  240 mg Oral Daily  . docusate sodium  100 mg Oral BID  . furosemide  40 mg Intravenous BID  . insulin aspart  0-20 Units Subcutaneous TID WC  . insulin aspart  0-5 Units Subcutaneous QHS  . nystatin   Topical BID  . pantoprazole  40 mg Oral Daily  . polyethylene glycol  17 g Oral Daily  . propranolol ER  80 mg Oral Daily  . senna  1 tablet Oral QHS   Continuous Infusions: . sodium chloride 10 mL/hr at 03/17/18 0600  .  ceFAZolin (ANCEF) IV Stopped (03/19/18 0705)     LOS: 5 days     Cordelia Poche, MD [facility] 03/19/2018, 1:16 PM Pager: [phone]  If 7PM-7AM, please contact night-coverage [email or web address] 03/19/2018, 1:16 PM

## 2018-03-19 NOTE — Progress Notes (Signed)
PHARMACY NOTE -  Ancef  Pharmacy has been assisting with dosing of Ancef for cellulitis.  Dosage remains stable at 2g IV q8 hr and need for further dosage adjustment appears unlikely at present given stable renal function.  Pharmacy will sign off, following peripherally for culture results or dose adjustments. Please reconsult if a change in clinical status warrants re-evaluation of dosage.  Reuel Boom, PharmD, BCPS [phone] 03/19/2018, 1:05 PM

## 2018-03-19 NOTE — Progress Notes (Addendum)
INFECTIOUS DISEASE PROGRESS NOTE  ID: Hannah Khan is a 62 y.o. female with  Principal Problem:   Severe sepsis ([place]) Active Problems:   Essential hypertension   Cirrhosis of liver, with portal HTN & Acites, by CT Abdomen 07/20/13   Atrial fibrillation with rapid ventricular response (HCC)   Permanent atrial fibrillation (HCC)   Type 2 diabetes mellitus without complication, without long-term current use of insulin (HCC)   Chronic diastolic CHF (congestive heart failure) (HCC)   Thrombocytopenia (HCC)   Cellulitis of right lower extremity  Subjective: Hallucinating earlier today Difficulty sleeping, fatigued.   Abtx:  Anti-infectives (From admission, onward)   Start     Dose/Rate Route Frequency Ordered Stop   03/19/18 1600  fluconazole (DIFLUCAN) tablet 200 mg     200 mg Oral Daily 03/19/18 1422 04/02/18 0959   03/19/18 1430  fluconazole (DIFLUCAN) tablet 200 mg  Status:  Discontinued     200 mg Oral Daily 03/19/18 1422 03/19/18 1422   03/18/18 2200  ceFAZolin (ANCEF) IVPB 2g/100 mL premix     2 g 200 mL/hr over 30 Minutes Intravenous Every 8 hours 03/18/18 1748     03/15/18 1800  vancomycin (VANCOCIN) 1,500 mg in sodium chloride 0.9 % 500 mL IVPB  Status:  Discontinued     1,500 mg 250 mL/hr over 120 Minutes Intravenous Every 24 hours 03/15/18 0242 03/18/18 1723   03/15/18 0600  piperacillin-tazobactam (ZOSYN) IVPB 3.375 g  Status:  Discontinued     3.375 g 12.5 mL/hr over 240 Minutes Intravenous Every 8 hours 03/15/18 0237 03/18/18 1723   03/14/18 2351  vancomycin (VANCOCIN) 1000 MG powder    Note to Pharmacy:  Miguel Rota   : cabinet override      03/14/18 2351 03/15/18 1159   03/14/18 2347  vancomycin (VANCOCIN) 1-5 GM/200ML-% IVPB  Status:  Discontinued    Note to Pharmacy:  Miguel Rota   : cabinet override      03/14/18 2347 03/14/18 2353   03/14/18 2300  vancomycin (VANCOCIN) 2,000 mg in sodium chloride 0.9 % 500 mL IVPB     2,000 mg 250 mL/hr over 120 Minutes  Intravenous  Once 03/14/18 2248 03/15/18 0732   03/14/18 2300  piperacillin-tazobactam (ZOSYN) IVPB 3.375 g     3.375 g 100 mL/hr over 30 Minutes Intravenous  Once 03/14/18 2248 03/14/18 2346      Medications:  Scheduled: . diltiazem  240 mg Oral Daily  . docusate sodium  100 mg Oral BID  . fluconazole  200 mg Oral Daily  . furosemide  40 mg Intravenous BID  . insulin aspart  0-20 Units Subcutaneous TID WC  . insulin aspart  0-5 Units Subcutaneous QHS  . nystatin   Topical BID  . pantoprazole  40 mg Oral Daily  . polyethylene glycol  17 g Oral Daily  . propranolol ER  80 mg Oral Daily  . senna  1 tablet Oral QHS    Objective: Vital signs in last 24 hours: Temp:  [97.7 F (36.5 C)-98.1 F (36.7 C)] 97.7 F (36.5 C) (07/01 1428) Pulse Rate:  [70-114] 70 (07/01 1428) Resp:  [18-20] 20 (07/01 1428) BP: (106-130)/(59-69) 106/60 (07/01 1428) SpO2:  [97 %-98 %] 98 % (07/01 0439) Weight:  [174.4 kg (384 lb 7.7 oz)] 174.4 kg (384 lb 7.7 oz) (07/01 1406)   General appearance: fatigued and no distress Resp: diminished breath sounds bilaterally Cardio: regular rate and rhythm GI: normal findings: bowel sounds normal and  soft, non-tender Extremities: bilateral LE edema.   RLE with erythema, crusting, serous blisters.   Lab Results Recent Labs    03/18/18 0505 03/19/18 0529  WBC 8.1 8.2  HGB 12.7 11.6*  HCT 37.1 34.4*  NA 134* 134*  K 4.0 3.4*  CL 104 100  CO2 24 24  BUN 29* 22  CREATININE 0.74 0.66   Liver Panel Recent Labs    03/18/18 0505 03/19/18 1121  PROT 6.2* 6.2*  ALBUMIN 2.8* 2.7*  AST 38 28  ALT 28 22  ALKPHOS 52 58  BILITOT 3.6* 2.6*  BILIDIR 0.9* 0.6*  IBILI 2.7* 2.0*   Sedimentation Rate No results for input(s): ESRSEDRATE in the last 72 hours. C-Reactive Protein No results for input(s): CRP in the last 72 hours.  Microbiology: Recent Results (from the past 240 hour(s))  Blood culture (routine x 2)     Status: None (Preliminary result)    Collection Time: 03/14/18 10:30 PM  Result Value Ref Range Status   Specimen Description   Final    BLOOD BLOOD LEFT ARM Performed at [facility], [address] [place], [place] [address]    Special Requests   Final    BOTTLES DRAWN AEROBIC AND ANAEROBIC Blood Culture results may not be optimal due to an inadequate volume of blood received in culture bottles Performed at [facility], [address] [place], [place] [address]    Culture   Final    NO GROWTH 4 DAYS Performed at [facility], [place] [address], [place], [place] [address]    Report Status PENDING  Incomplete  MRSA PCR Screening     Status: None   Collection Time: 03/15/18  2:39 AM  Result Value Ref Range Status   MRSA by PCR NEGATIVE NEGATIVE Final    Comment:        The GeneXpert MRSA Assay (FDA approved for NASAL specimens only), is one component of a comprehensive MRSA colonization surveillance program. It is not intended to diagnose MRSA infection nor to guide or monitor treatment for MRSA infections. Performed at [facility], [place] [address] [place], [place] [address]   Culture, blood (routine x 2)     Status: None (Preliminary result)   Collection Time: 03/15/18  3:35 AM  Result Value Ref Range Status   Specimen Description   Final    BLOOD RIGHT ANTECUBITAL Performed at [facility] [address] [place], [place] [address]    Special Requests   Final    BOTTLES DRAWN AEROBIC ONLY Blood Culture adequate volume   Culture   Final    NO GROWTH 4 DAYS Performed at [facility], [place] [address], [place], [place] [address]    Report Status PENDING  Incomplete  Culture, blood (routine x 2)     Status: None (Preliminary result)   Collection Time: 03/15/18  3:35 AM  Result Value Ref Range Status   Specimen Description   Final    BLOOD RIGHT HAND Performed at [facility] [address], [place], [place]  [address]    Special Requests   Final    BOTTLES DRAWN AEROBIC ONLY Blood Culture adequate volume   Culture   Final    NO GROWTH 4 DAYS Performed at [facility], [place] [address] [place],  [address]    Report Status PENDING  Incomplete  Urine Culture     Status: Abnormal  Collection Time: 03/16/18  4:49 AM  Result Value Ref Range Status   Specimen Description   Final    URINE, RANDOM Performed at [facility] [address], [place], [place] [address]    Special Requests   Final    NONE Performed at [facility], [place] [address] [place], [place] [address]    Culture >=100,000 COLONIES/mL YEAST (A)  Final   Report Status 03/17/2018 FINAL  Final    Studies/Results: No results found.   Assessment/Plan: 1. Cellulitis (no abscess on CT) 2. Morbid obesity 3. Prior hx of cellulitis 4. Nash/cirrohsis 5. funguria 6. Morbid obesity  Total days of antibiotics:6 (ancef)  Would continue to elevate leg continue ancef to day 10 Compression of LE as tolerated Will ask WOC to see to help with wound mgmt.  Long term weight loss strategies Do not treat fungus in urine. She does not have renal txp or any hardware in her kidney. Will stop flucon.  Pt, family updated.  Available as needed.           Bobby Rumpf MD, FACP Infectious Diseases (pager) [phone] [email or web address] 03/19/2018, 2:42 PM  LOS: 5 days

## 2018-03-20 ENCOUNTER — Encounter (HOSPITAL_COMMUNITY): Payer: Self-pay | Admitting: Radiology

## 2018-03-20 ENCOUNTER — Inpatient Hospital Stay (HOSPITAL_COMMUNITY): Payer: BC Managed Care – PPO

## 2018-03-20 LAB — CULTURE, BLOOD (ROUTINE X 2)
CULTURE: NO GROWTH
Culture: NO GROWTH
Culture: NO GROWTH
SPECIAL REQUESTS: ADEQUATE
SPECIAL REQUESTS: ADEQUATE

## 2018-03-20 LAB — GLUCOSE, CAPILLARY
GLUCOSE-CAPILLARY: 110 mg/dL — AB (ref 70–99)
GLUCOSE-CAPILLARY: 153 mg/dL — AB (ref 70–99)
GLUCOSE-CAPILLARY: 122 mg/dL — AB (ref 70–99)
Glucose-Capillary: 122 mg/dL — ABNORMAL HIGH (ref 70–99)

## 2018-03-20 LAB — HEPATITIS PANEL, ACUTE
HCV Ab: <0.1 s/co ratio (ref 0.0–0.9)
HEP B C IGM: NEGATIVE
HEP B S AG: NEGATIVE
Hep A IgM: NEGATIVE

## 2018-03-20 LAB — CREATININE, SERUM
Creatinine, Ser: 0.67 mg/dL (ref 0.44–1.00)
GFR calc non Af Amer: >60 mL/min (ref >60)

## 2018-03-20 LAB — HCV COMMENT:

## 2018-03-20 LAB — HEPATITIS B SURFACE ANTIGEN: HEP B S AG: NEGATIVE

## 2018-03-20 LAB — HEPATITIS C ANTIBODY (REFLEX): HCV Ab: <0.1 s/co ratio (ref 0.0–0.9)

## 2018-03-20 MED ORDER — IOPAMIDOL (ISOVUE-300) INJECTION 61%
INTRAVENOUS | Status: AC
  Filled 2018-03-20: qty 100

## 2018-03-20 MED ORDER — HYDROMORPHONE HCL 1 MG/ML IJ SOLN
0.5000 mg | Freq: Once | INTRAMUSCULAR | Status: AC | PRN
  Administered 2018-03-20: 0.5 mg via INTRAVENOUS
  Filled 2018-03-20: qty 0.5

## 2018-03-20 MED ORDER — HYDROXYZINE HCL 25 MG PO TABS
25.0000 mg | ORAL_TABLET | Freq: Four times a day (QID) | ORAL | Status: DC | PRN
  Administered 2018-03-20 (×2): 25 mg via ORAL
  Filled 2018-03-20 (×2): qty 1

## 2018-03-20 MED ORDER — AQUAPHOR EX OINT
TOPICAL_OINTMENT | Freq: Two times a day (BID) | CUTANEOUS | Status: DC
  Administered 2018-03-20 – 2018-03-23 (×7): via TOPICAL
  Filled 2018-03-20: qty 50

## 2018-03-20 NOTE — Progress Notes (Signed)
PROGRESS NOTE    Hannah Khan  OEU:[id number] DOB: 1956/01/22 DOA: 03/14/2018 PCP: Lujean Amel, MD   Brief Narrative: Hannah Khan is a 62 y.o. female with a history of NASH cirrhosis, hypertension, diabetes mellitus type 2, atrial fibrillation, pancytopenia, ITP, morbid obesity.  Patient presented secondary to dyspnea and right lower extremity pain.  She was found to have cellulitis of her right lower extremity in addition to meeting sepsis criteria and having atrial fibrillation with RVR.  Patient has been managed with IV fluids, IV antibiotics, diltiazem drip.  Blood pressure is improved but patient is still in RVR.  Cardiology is consulted for help managing A. fib.  Patient also with decreased platelets in setting of acute infection.  Medical oncology consulted.   Assessment & Plan:   Principal Problem:   Severe sepsis [facility]) Active Problems:   Essential hypertension   Cirrhosis of liver, with portal HTN & Acites, by CT Abdomen 07/20/13   Atrial fibrillation with rapid ventricular response (HCC)   Permanent atrial fibrillation (HCC)   Type 2 diabetes mellitus without complication, without long-term current use of insulin (HCC)   Chronic diastolic CHF (congestive heart failure) (HCC)   Thrombocytopenia (HCC)   Cellulitis of right lower extremity   Type 2 diabetes mellitus with other specified complication (HCC)   Severe sepsis Secondary to cellulitis of her right leg.  Concern for bacteremia.  No abscess on CT scan.  Hypotension has resolved with IV antibiotics and initial fluid resuscitation. Procalcitonin trended down. Complicated by atrial fibrillation with RVR.  Cellulitis of the right lower leg No abscess. Blood cultures without results to date. Slightly worsened today. Venous duplex negative for DVT. Vancomycin and Zosyn discontinued and transitioned to Cefazolin -Infectious disease recommendations -Continue to follow blood cultures -Continue Lasix -CT  leg  Permanent atrial fibrillation with RVR In setting of acute infection.  Cardiology consulted. Transitioned to Cardizem PO and continued on propranolol.  Pancytopenia ITP Patient follows with Dr. Alen Blew as an outpatient.  Platelets of around 30,000 on admission.  Stable.  NASH cirrhosis AST and ALT are not significantly elevated in setting of cirrhosis.  Bilirubin is up from baseline on admission but now trending down. Weight is up significantly. UOP of 527m over last 24 hours with unmeasured output. Weight down 12 lbs -Fluid restricted diet -Diuresis as mentioned above  Hyperbilirubinemia Mostly indirect. LDH mildly elevated. Hemoglobin stable. Bilirubin improved. Haptoglobin slightly low  Diabetes mellitus, type 2 -Continue sliding scale insulin  Essential hypertension Patient is on diltiazem and propranolol as an outpatient.  Pressure has been low in setting of acute infection.  Will manage blood pressure as needed.  Chronic pain -Continue oxycodone  Hallucinations Unknown etiology. Possibly related to fentanyl. Patient with yeast on urinalysis. No urinary symptoms but could possibly be contributing. Hallucinations improved today.  Diplopia Recent onset. Exam remarkable for double vision. No lid lag. No neurological deficits -CT head  Funguria Started on fluconazole. Discontinued by ID.  Morbid obesity Body mass index is 58.39 kg/m.   DVT prophylaxis: SCD Code Status:   Code Status: Full Code Family Communication: Husband at bedside Disposition Plan: Discharge pending clinical improvement   Consultants:   Critical care medicine  Cardiology  Medical oncology  Procedures:   None  Antimicrobials:  Vancomycin (6/26>>6/30)  Zosyn (6/26>>6/30)  Cefazolin (6/30>>   Subjective: Hallucinations improved. Diplopia today.  Objective: Vitals:   03/19/18 2007 03/20/18 0507 03/20/18 0928 03/20/18 1305  BP: 117/62 (!) 107/57 120/74 (!) 103/55  Pulse:  91 (!) 106 89 71  Resp: 16 20  16   Temp: (!) 97.4 F (36.3 C) (!) 97.3 F (36.3 C)    TempSrc: Oral     SpO2: 98% 94%  93%  Weight:  (!) 169.1 kg (372 lb 12.8 oz)    Height:        Intake/Output Summary (Last 24 hours) at 03/20/2018 1419 Last data filed at 03/20/2018 1255 Gross per 24 hour  Intake 786.67 ml  Output -  Net 786.67 ml   Filed Weights   03/18/18 0507 03/19/18 1406 03/20/18 0507  Weight: (!) 171.7 kg (378 lb 8.5 oz) (!) 174.4 kg (384 lb 7.7 oz) (!) 169.1 kg (372 lb 12.8 oz)    Examination:  General exam: Appears calm and comfortable Respiratory system: Clear to auscultation. Respiratory effort normal. Cardiovascular system: S1 & S2 heard, RRR. No murmurs. Gastrointestinal system: Abdomen is nondistended, soft and nontender. Normal bowel sounds heard. Central nervous system: Alert and oriented. No focal neurological deficits. EOMI. No lid lag. Extremities: No edema. No calf tenderness Skin: No cyanosis. Erythematous rash of entire right lower leg with extension to lower thigh and associated bullae/sloughing and tenderness Psychiatry: Judgement and insight appear normal. Mood & affect appropriate.     Data Reviewed: I have personally reviewed following labs and imaging studies  CBC: Recent Labs  Lab 03/14/18 2231 03/15/18 0338 03/16/18 0319 03/17/18 0308 03/18/18 0505 03/19/18 0529  WBC 12.6* 9.4 4.7 6.5 8.1 8.2  NEUTROABS 11.4*  --   --   --   --   --   HGB 14.2 13.6 12.3 12.4 12.7 11.6*  HCT 40.9 40.9 37.3 36.8 37.1 34.4*  MCV 89.3 93.0 92.6 90.9 89.2 88.7  PLT 32* 24* 17* 28* 32* 32*   Basic Metabolic Panel: Recent Labs  Lab 03/15/18 0335 03/16/18 0319 03/17/18 0308 03/18/18 0505 03/19/18 0529 03/20/18 0453  NA 139 135 134* 134* 134*  --   K 3.9 3.8 4.2 4.0 3.4*  --   CL 107 107 103 104 100  --   CO2 20* 22 21* 24 24  --   GLUCOSE 144* 131* 131* 132* 133*  --   BUN 27* 35* 37* 29* 22  --   CREATININE 1.15* 0.95 0.97 0.74 0.66 0.67   CALCIUM 8.9 8.6* 8.8* 8.7* 8.7*  --   MG 1.4* 2.3  --   --   --   --   PHOS 3.5 3.0  --   --   --   --    GFR: Estimated Creatinine Clearance: 120.4 mL/min (by C-G formula based on SCr of 0.67 mg/dL). Liver Function Tests: Recent Labs  Lab 03/14/18 2231 03/16/18 0319 03/18/18 0505 03/19/18 1121  AST 59* 65* 38 28  ALT 25 36 28 22  ALKPHOS 78 47 52 58  BILITOT 5.0* 6.0*  6.0* 3.6* 2.6*  PROT 6.7 6.2* 6.2* 6.2*  ALBUMIN 3.3* 3.0* 2.8* 2.7*   No results for input(s): LIPASE, AMYLASE in the last 168 hours. No results for input(s): AMMONIA in the last 168 hours. Coagulation Profile: No results for input(s): INR, PROTIME in the last 168 hours. Cardiac Enzymes: No results for input(s): CKTOTAL, CKMB, CKMBINDEX, TROPONINI in the last 168 hours. BNP (last 3 results) No results for input(s): PROBNP in the last 8760 hours. HbA1C: No results for input(s): HGBA1C in the last 72 hours. CBG: Recent Labs  Lab 03/19/18 1132 03/19/18 1701 03/19/18 2232 03/20/18 0725 03/20/18 1203  GLUCAP 103* 142*  100* 122* 110*   Lipid Profile: No results for input(s): CHOL, HDL, LDLCALC, TRIG, CHOLHDL, LDLDIRECT in the last 72 hours. Thyroid Function Tests: No results for input(s): TSH, T4TOTAL, FREET4, T3FREE, THYROIDAB in the last 72 hours. Anemia Panel: No results for input(s): VITAMINB12, FOLATE, FERRITIN, TIBC, IRON, RETICCTPCT in the last 72 hours. Sepsis Labs: Recent Labs  Lab 03/14/18 2244 03/15/18 0022 03/15/18 0335 03/15/18 0547 03/16/18 0319 03/17/18 0308  PROCALCITON  --   --  39.08  --  27.30 19.21  LATICACIDVEN 4.76* 5.00* 3.8* 3.4*  --   --     Recent Results (from the past 240 hour(s))  Blood culture (routine x 2)     Status: None   Collection Time: 03/14/18 10:30 PM  Result Value Ref Range Status   Specimen Description   Final    BLOOD BLOOD LEFT ARM Performed at [facility], [place]., [place], [place] [address]    Special Requests   Final     BOTTLES DRAWN AEROBIC AND ANAEROBIC Blood Culture results may not be optimal due to an inadequate volume of blood received in culture bottles Performed at [facility], [address] [place], [place] [address]    Culture   Final    NO GROWTH 5 DAYS Performed at [facility], [place] [address], [place], [place] [address]    Report Status 03/20/2018 FINAL  Final  MRSA PCR Screening     Status: None   Collection Time: 03/15/18  2:39 AM  Result Value Ref Range Status   MRSA by PCR NEGATIVE NEGATIVE Final    Comment:        The GeneXpert MRSA Assay (FDA approved for NASAL specimens only), is one component of a comprehensive MRSA colonization surveillance program. It is not intended to diagnose MRSA infection nor to guide or monitor treatment for MRSA infections. Performed at [facility], [place] [address], [place], [place] [address]   Culture, blood (routine x 2)     Status: None   Collection Time: 03/15/18  3:35 AM  Result Value Ref Range Status   Specimen Description   Final    BLOOD RIGHT ANTECUBITAL Performed at [facility] [address], [place], [place] [address]    Special Requests   Final    BOTTLES DRAWN AEROBIC ONLY Blood Culture adequate volume   Culture   Final    NO GROWTH 5 DAYS Performed at [facility], [place] [address] [place], [place] [address]    Report Status 03/20/2018 FINAL  Final  Culture, blood (routine x 2)     Status: None   Collection Time: 03/15/18  3:35 AM  Result Value Ref Range Status   Specimen Description   Final    BLOOD RIGHT HAND Performed at [facility] [address], [place], [place] [address]    Special Requests   Final    BOTTLES DRAWN AEROBIC ONLY Blood Culture adequate volume   Culture   Final    NO GROWTH 5 DAYS Performed at [facility], [place] [address], [place], [place] [address]    Report Status 03/20/2018 FINAL  Final  Urine Culture      Status: Abnormal   Collection Time: 03/16/18  4:49 AM  Result Value Ref Range Status   Specimen Description   Final    URINE, RANDOM Performed at [facility] [address]  [place], [place] [address]    Special Requests   Final    NONE Performed at [facility], [place] [address] [place], [place] [address]    Culture >=100,000 COLONIES/mL YEAST (A)  Final   Report Status 03/17/2018 FINAL  Final         Radiology Studies: No results found.      Scheduled Meds: . diltiazem  240 mg Oral Daily  . docusate sodium  100 mg Oral BID  . furosemide  40 mg Intravenous BID  . insulin aspart  0-20 Units Subcutaneous TID WC  . insulin aspart  0-5 Units Subcutaneous QHS  . iopamidol      . mineral oil-hydrophilic petrolatum   Topical BID  . nystatin   Topical BID  . pantoprazole  40 mg Oral Daily  . polyethylene glycol  17 g Oral Daily  . propranolol ER  80 mg Oral Daily  . senna  1 tablet Oral QHS   Continuous Infusions: . sodium chloride 10 mL/hr at 03/17/18 0600  .  ceFAZolin (ANCEF) IV Stopped (03/20/18 7127)     LOS: 6 days     Cordelia Poche, MD [facility] 03/20/2018, 2:19 PM Pager: [phone]  If 7PM-7AM, please contact night-coverage [email or web address] 03/20/2018, 2:19 PM

## 2018-03-20 NOTE — Consult Note (Signed)
[facility] Nurse wound consult note Reason for Consult: peeling skin related to infection, Right medial thigh with epidermal peeling, Right LE with erythema and tissue poss posteriorly. Wound type:cellulitis Pressure Injury POA: NA Measurement: 5cm x 4cm right medial thigh 20 cm x 20cm x 0.1cm partial thickness loss of epidermis at the posterior calf Wound bed: peeling epidermis, yellow tissue evident beneath Drainage (amount, consistency, odor) serous, moderate amount Periwound: intact, dry Dressing procedure/placement/frequency: Patient is on DermaTherapy linen, I will provide a mattress replacement (bariatric) with low air loss feature to assist in the drying and comfort. Topical orders for Aquaphor ointment to the right medial thigh are provided and I will provide InterDry Ag+ for the lower extremity.  This antimicrobial textile is non adherent and will lend its absorbent and antimicrobial properties to the systemic POC. Patient is using ice packs for comfort.   [facility] nursing team will not follow, but will remain available to this patient, the nursing and medical teams.  Please re-consult if needed. Thanks, Maudie Flakes, MSN, RN, [place], [facility]  Pager# [phone]

## 2018-03-21 LAB — CBC
HCT: 32.8 % — ABNORMAL LOW (ref 36.0–46.0)
HEMOGLOBIN: 11 g/dL — AB (ref 12.0–15.0)
MCH: 30.6 pg (ref 26.0–34.0)
MCHC: 33.5 g/dL (ref 30.0–36.0)
MCV: 91.4 fL (ref 78.0–100.0)
Platelets: 46 10*3/uL — ABNORMAL LOW (ref 150–400)
RBC: 3.59 MIL/uL — AB (ref 3.87–5.11)
RDW: 15.8 % — ABNORMAL HIGH (ref 11.5–15.5)
WBC: 6.4 10*3/uL (ref 4.0–10.5)

## 2018-03-21 LAB — BASIC METABOLIC PANEL
ANION GAP: 9 (ref 5–15)
BUN: 16 mg/dL (ref 8–23)
CALCIUM: 8.5 mg/dL — AB (ref 8.9–10.3)
CO2: 29 mmol/L (ref 22–32)
Chloride: 100 mmol/L (ref 98–111)
Creatinine, Ser: 0.54 mg/dL (ref 0.44–1.00)
GFR calc non Af Amer: >60 mL/min (ref >60)
Glucose, Bld: 119 mg/dL — ABNORMAL HIGH (ref 70–99)
Potassium: 3.6 mmol/L (ref 3.5–5.1)
Sodium: 138 mmol/L (ref 135–145)

## 2018-03-21 LAB — GLUCOSE, CAPILLARY
GLUCOSE-CAPILLARY: 114 mg/dL — AB (ref 70–99)
Glucose-Capillary: 147 mg/dL — ABNORMAL HIGH (ref 70–99)
Glucose-Capillary: 133 mg/dL — ABNORMAL HIGH (ref 70–99)
Glucose-Capillary: 121 mg/dL — ABNORMAL HIGH (ref 70–99)

## 2018-03-21 LAB — AMMONIA: AMMONIA: 35 umol/L (ref 9–35)

## 2018-03-21 NOTE — Evaluation (Signed)
Physical Therapy Evaluation Patient Details Name: Hannah Khan MRN: [id number] DOB: 02-Apr-1956 Today's Date: 03/21/2018   History of Present Illness  62 y.o. female with a history of NASH cirrhosis, hypertension, diabetes mellitus type 2, atrial fibrillation, pancytopenia, ITP, morbid obesity and admitted for severe sepsis and cellulitis of right lower leg.  Clinical Impression  Pt admitted with above diagnosis. Pt currently with functional limitations due to the deficits listed below (see PT Problem List).  Pt will benefit from skilled PT to increase their independence and safety with mobility to allow discharge to the venue listed below.  Pt reports blurry vision off and on since admission possibly related to pain meds (RN aware).  Pt assisted with ambulating into hallway however limited distance due to pain and fatigue.  Pt and spouse concerned about pt's safety and ability to mobilize upon d/c home.  Pt is agreeable to SNF pending insurance.     Follow Up Recommendations SNF    Equipment Recommendations  Rolling walker with 5" wheels    Recommendations for Other Services       Precautions / Restrictions Precautions Precautions: Fall Precaution Comments: limited R shoulder movement due to hx of proximal humerus fx in Jan      Mobility  Bed Mobility Overal bed mobility: Needs Assistance Bed Mobility: Supine to Sit;Sit to Supine     Supine to sit: Min guard;HOB elevated Sit to supine: Min assist   General bed mobility comments: increased time and effort, assist for R LE positioning for back to bed  Transfers Overall transfer level: Needs assistance Equipment used: Rolling walker (2 wheeled) Transfers: Sit to/from Stand Sit to Stand: Min assist         General transfer comment: verbal cues for safe technique including hand placement  Ambulation/Gait Ambulation/Gait assistance: Min assist Gait Distance (Feet): 16 Feet Assistive device: Rolling walker (2  wheeled) Gait Pattern/deviations: Step-to pattern;Decreased stance time - right;Wide base of support     General Gait Details: verbal cues for step to sequence for pain control, limited distance due to R shoulder and leg pain, assist for steadying  Stairs            Wheelchair Mobility    Modified Rankin (Stroke Patients Only)       Balance Overall balance assessment: Needs assistance         Standing balance support: Bilateral upper extremity supported Standing balance-Leahy Scale: Poor                               Pertinent Vitals/Pain Pain Assessment: 0-10 Pain Score: 7  Pain Location: R leg, R shoulder Pain Intervention(s): Repositioned;Monitored during session;Limited activity within patient's tolerance(not due to pain meds, declined tylenol per RN)    Home Living Family/patient expects to be discharged to:: Private residence Living Arrangements: Spouse/significant other Available Help at Discharge: Family Type of Home: House Home Access: Stairs to enter   [employer] of Steps: 1 tall step in garage, 2 to enter house from front Home Layout: One level Home Equipment: Cane - single point;Walker - 4 wheels(rollator with seat)      Prior Function Level of Independence: Independent with assistive device(s)         Comments: typically not using assistive device, has been going to OP PT for R proximal humerus fx in January, started using rollator with onset of leg pain prior to admission     Hand Dominance  Extremity/Trunk Assessment   Upper Extremity Assessment Upper Extremity Assessment: RUE deficits/detail;Generalized weakness RUE Deficits / Details: R shoulder deficits from humeral fracture in Jan, states she has been doing OP PT    Lower Extremity Assessment Lower Extremity Assessment: RLE deficits/detail;Generalized weakness RLE Deficits / Details: R lower leg pain, redness, able to lift slightly against  gravity       Communication   Communication: No difficulties  Cognition Arousal/Alertness: Awake/alert Behavior During Therapy: WFL for tasks assessed/performed Overall Cognitive Status: Within Functional Limits for tasks assessed                                 General Comments: pt reports blurred vision and room movement, possibly due to pain meds taken earlier this morning? (RN aware)      General Comments      Exercises     Assessment/Plan    PT Assessment Patient needs continued PT services  PT Problem List Decreased strength;Decreased mobility;Decreased activity tolerance;Decreased knowledge of use of DME;Decreased balance;Pain;Obesity       PT Treatment Interventions DME instruction;Therapeutic activities;Therapeutic exercise;Gait training;Patient/family education;Functional mobility training    PT Goals (Current goals can be found in the Care Plan section)  Acute Rehab PT Goals PT Goal Formulation: With patient Time For Goal Achievement: 04/04/18 Potential to Achieve Goals: Good    Frequency Min 2X/week   Barriers to discharge        Co-evaluation               AM-PAC PT "6 Clicks" Daily Activity  Outcome Measure Difficulty turning over in bed (including adjusting bedclothes, sheets and blankets)?: A Lot Difficulty moving from lying on back to sitting on the side of the bed? : Unable Difficulty sitting down on and standing up from a chair with arms (e.g., wheelchair, bedside commode, etc,.)?: Unable Help needed moving to and from a bed to chair (including a wheelchair)?: A Little Help needed walking in hospital room?: A Little Help needed climbing 3-5 steps with a railing? : A Lot 6 Click Score: 12    End of Session Equipment Utilized During Treatment: Gait belt Activity Tolerance: Patient limited by pain;Patient limited by fatigue Patient left: in bed;with call bell/phone within reach;with family/visitor present Nurse  Communication: Mobility status PT Visit Diagnosis: Difficulty in walking, not elsewhere classified (R26.2);Unsteadiness on feet (R26.81)    Time: 7121-9758 PT Time Calculation (min) (ACUTE ONLY): 28 min   Charges:   PT Evaluation $PT Eval Moderate Complexity: 1 Mod     PT G Codes:       Carmelia Bake, PT, DPT 03/21/2018 Pager: [phone]  York Ram E 03/21/2018, 4:07 PM

## 2018-03-21 NOTE — Progress Notes (Signed)
PROGRESS NOTE    Hannah Khan  NFA:[id number] DOB: 1955/10/24 DOA: 03/14/2018 PCP: Lujean Amel, MD    Brief Narrative:  Hannah Khan is a 62 y.o. female with a history of NASH cirrhosis, hypertension, diabetes mellitus type 2, atrial fibrillation, pancytopenia, ITP, morbid obesity.  Patient presented secondary to dyspnea and right lower extremity pain.  She was found to have cellulitis of her right lower extremity in addition to meeting sepsis criteria and having atrial fibrillation with RVR.  Patient has been managed with IV fluids, IV antibiotics, diltiazem drip.  Blood pressure is improved but patient is still in RVR.  Cardiology is consulted for help managing A. fib.  Patient also with decreased platelets in setting of acute infection.  Medical oncology consulted.  Assessment & Plan:   Principal Problem:   Severe sepsis [facility]) Active Problems:   Essential hypertension   Cirrhosis of liver, with portal HTN & Acites, by CT Abdomen 07/20/13   Atrial fibrillation with rapid ventricular response (HCC)   Permanent atrial fibrillation (HCC)   Type 2 diabetes mellitus without complication, without long-term current use of insulin (HCC)   Chronic diastolic CHF (congestive heart failure) (HCC)   Thrombocytopenia (HCC)   Cellulitis of right lower extremity   Type 2 diabetes mellitus with other specified complication (HCC)  Severe sepsis Secondary to cellulitis of her right leg.  Concern for bacteremia.  No abscess on CT scan.  Hypotension has resolved with IV antibiotics and initial fluid resuscitation. Procalcitonin trended down. Complicated by atrial fibrillation with RVR. Stable at present. No leukocytosis ID recs for ancef to continue to day 10  Cellulitis of the right lower leg No abscess. Blood cultures without results to date. Slightly worsened today. Venous duplex negative for DVT. Vancomycin and Zosyn discontinued and transitioned to Cefazolin -Infectious disease  recommendations for ancef through day 10 -Continue to follow blood cultures -Continue Lasix  Permanent atrial fibrillation with RVR In setting of acute infection.  Cardiology consulted. Transitioned to Cardizem PO and continued on propranolol. Stable at present  Pancytopenia ITP Patient follows with Dr. Alen Blew as an outpatient.  Platelets of around 30,000 on admission. Plts of 40k today Repeat cbc in AM  NASH cirrhosis AST and ALT are not significantly elevated in setting of cirrhosis.  Bilirubin is up from baseline on admission but now trending down. Weight is up significantly. UOP of 511m over last 24 hours with unmeasured output. Weight down 12 lbs -Fluid restricted diet -continue to diurese -Repeat LFT in AM  Hyperbilirubinemia Mostly indirect. LDH mildly elevated. Hemoglobin stable. Bilirubin improved. Repeat lft in am  Diabetes mellitus, type 2 -Continue sliding scale insulin as needed  Essential hypertension Patient is on diltiazem and propranolol as an outpatient.  Pressure has been low in setting of acute infection.  BP stable at present  Chronic pain -Continue oxycodone as needed  Hallucinations Unknown etiology. Possibly related to fentanyl. Patient with yeast on urinalysis. No urinary symptoms but could possibly be contributing. Hallucinations non-existent this AM. Seems to correlate to administration of narcotics  Diplopia Recent onset. Exam remarkable for double vision. No lid lag. No neurological deficits at this time -CT head negative, reviewed  Funguria Started on fluconazole. Discontinued by ID.  Morbid obesity Body mass index is 58.39 kg/m.   DVT prophylaxis: SCD's Code Status: Full Family Communication: Pt in room, family at bedside Disposition Plan: SNF, timing uncetain  Consultants:   ID  Cardiology  Procedures:     Antimicrobials: Anti-infectives (From admission,  onward)   Start     Dose/Rate Route Frequency Ordered  Stop   03/19/18 1600  fluconazole (DIFLUCAN) tablet 200 mg  Status:  Discontinued     200 mg Oral Daily 03/19/18 1422 03/19/18 1624   03/19/18 1430  fluconazole (DIFLUCAN) tablet 200 mg  Status:  Discontinued     200 mg Oral Daily 03/19/18 1422 03/19/18 1422   03/18/18 2200  ceFAZolin (ANCEF) IVPB 2g/100 mL premix     2 g 200 mL/hr over 30 Minutes Intravenous Every 8 hours 03/18/18 1748     03/15/18 1800  vancomycin (VANCOCIN) 1,500 mg in sodium chloride 0.9 % 500 mL IVPB  Status:  Discontinued     1,500 mg 250 mL/hr over 120 Minutes Intravenous Every 24 hours 03/15/18 0242 03/18/18 1723   03/15/18 0600  piperacillin-tazobactam (ZOSYN) IVPB 3.375 g  Status:  Discontinued     3.375 g 12.5 mL/hr over 240 Minutes Intravenous Every 8 hours 03/15/18 0237 03/18/18 1723   03/14/18 2351  vancomycin (VANCOCIN) 1000 MG powder    Note to Pharmacy:  Miguel Rota   : cabinet override      03/14/18 2351 03/15/18 1159   03/14/18 2347  vancomycin (VANCOCIN) 1-5 GM/200ML-% IVPB  Status:  Discontinued    Note to Pharmacy:  Miguel Rota   : cabinet override      03/14/18 2347 03/14/18 2353   03/14/18 2300  vancomycin (VANCOCIN) 2,000 mg in sodium chloride 0.9 % 500 mL IVPB     2,000 mg 250 mL/hr over 120 Minutes Intravenous  Once 03/14/18 2248 03/15/18 0732   03/14/18 2300  piperacillin-tazobactam (ZOSYN) IVPB 3.375 g     3.375 g 100 mL/hr over 30 Minutes Intravenous  Once 03/14/18 2248 03/14/18 2346       Subjective: Complains of continued leg pain  Objective: Vitals:   03/20/18 2010 03/21/18 0551 03/21/18 1038 03/21/18 1305  BP: 117/69 125/62 112/63 (!) 114/52  Pulse: 88 99 (!) 106 (!) 101  Resp: 18 18  18   Temp: 97.8 F (36.6 C) 97.9 F (36.6 C) 97.7 F (36.5 C) (!) 97.5 F (36.4 C)  TempSrc:   Oral Oral  SpO2: 97% 96% 95% 96%  Weight:  (!) 167.1 kg (368 lb 6.2 oz)    Height:        Intake/Output Summary (Last 24 hours) at 03/21/2018 1700 Last data filed at 03/21/2018 1633 Gross per 24  hour  Intake 2276.66 ml  Output 3000 ml  Net -723.34 ml   Filed Weights   03/19/18 1406 03/20/18 0507 03/21/18 0551  Weight: (!) 174.4 kg (384 lb 7.7 oz) (!) 169.1 kg (372 lb 12.8 oz) (!) 167.1 kg (368 lb 6.2 oz)    Examination:  General exam: Appears calm and comfortable  Respiratory system: Clear to auscultation. Respiratory effort normal. Cardiovascular system: S1 & S2 heard, RRR. Gastrointestinal system: Abdomen is nondistended, soft and nontender. No organomegaly or masses felt. Normal bowel sounds heard. Central nervous system: Alert and oriented. No focal neurological deficits. Extremities: Symmetric 5 x 5 power. Skin: No rashes, hyperpigmentation over B LE, RLE with erythema improving Psychiatry: Judgement and insight appear normal. Mood & affect appropriate.   Data Reviewed: I have personally reviewed following labs and imaging studies  CBC: Recent Labs  Lab 03/14/18 2231  03/16/18 0319 03/17/18 0308 03/18/18 0505 03/19/18 0529 03/21/18 0502  WBC 12.6*   < > 4.7 6.5 8.1 8.2 6.4  NEUTROABS 11.4*  --   --   --   --   --   --  HGB 14.2   < > 12.3 12.4 12.7 11.6* 11.0*  HCT 40.9   < > 37.3 36.8 37.1 34.4* 32.8*  MCV 89.3   < > 92.6 90.9 89.2 88.7 91.4  PLT 32*   < > 17* 28* 32* 32* 46*   < > = values in this interval not displayed.   Basic Metabolic Panel: Recent Labs  Lab 03/15/18 0335 03/16/18 0319 03/17/18 0308 03/18/18 0505 03/19/18 0529 03/20/18 0453 03/21/18 0502  NA 139 135 134* 134* 134*  --  138  K 3.9 3.8 4.2 4.0 3.4*  --  3.6  CL 107 107 103 104 100  --  100  CO2 20* 22 21* 24 24  --  29  GLUCOSE 144* 131* 131* 132* 133*  --  119*  BUN 27* 35* 37* 29* 22  --  16  CREATININE 1.15* 0.95 0.97 0.74 0.66 0.67 0.54  CALCIUM 8.9 8.6* 8.8* 8.7* 8.7*  --  8.5*  MG 1.4* 2.3  --   --   --   --   --   PHOS 3.5 3.0  --   --   --   --   --    GFR: Estimated Creatinine Clearance: 119.5 mL/min (by C-G formula based on SCr of 0.54 mg/dL). Liver Function  Tests: Recent Labs  Lab 03/14/18 2231 03/16/18 0319 03/18/18 0505 03/19/18 1121  AST 59* 65* 38 28  ALT 25 36 28 22  ALKPHOS 78 47 52 58  BILITOT 5.0* 6.0*  6.0* 3.6* 2.6*  PROT 6.7 6.2* 6.2* 6.2*  ALBUMIN 3.3* 3.0* 2.8* 2.7*   No results for input(s): LIPASE, AMYLASE in the last 168 hours. Recent Labs  Lab 03/21/18 0814  AMMONIA 35   Coagulation Profile: No results for input(s): INR, PROTIME in the last 168 hours. Cardiac Enzymes: No results for input(s): CKTOTAL, CKMB, CKMBINDEX, TROPONINI in the last 168 hours. BNP (last 3 results) No results for input(s): PROBNP in the last 8760 hours. HbA1C: No results for input(s): HGBA1C in the last 72 hours. CBG: Recent Labs  Lab 03/20/18 1719 03/20/18 2139 03/21/18 0717 03/21/18 1132 03/21/18 1605  GLUCAP 153* 122* 114* 147* 133*   Lipid Profile: No results for input(s): CHOL, HDL, LDLCALC, TRIG, CHOLHDL, LDLDIRECT in the last 72 hours. Thyroid Function Tests: No results for input(s): TSH, T4TOTAL, FREET4, T3FREE, THYROIDAB in the last 72 hours. Anemia Panel: No results for input(s): VITAMINB12, FOLATE, FERRITIN, TIBC, IRON, RETICCTPCT in the last 72 hours. Sepsis Labs: Recent Labs  Lab 03/14/18 2244 03/15/18 0022 03/15/18 0335 03/15/18 0547 03/16/18 0319 03/17/18 0308  PROCALCITON  --   --  39.08  --  27.30 19.21  LATICACIDVEN 4.76* 5.00* 3.8* 3.4*  --   --     Recent Results (from the past 240 hour(s))  Blood culture (routine x 2)     Status: None   Collection Time: 03/14/18 10:30 PM  Result Value Ref Range Status   Specimen Description   Final    BLOOD BLOOD LEFT ARM Performed at [facility], [address] [place], [place] [address]    Special Requests   Final    BOTTLES DRAWN AEROBIC AND ANAEROBIC Blood Culture results may not be optimal due to an inadequate volume of blood received in culture bottles Performed at [facility], [address] [place], [place] [address]     Culture   Final    NO GROWTH 5 DAYS Performed at  [facility], [place] [address] [place], [place] [address]    Report Status 03/20/2018 FINAL  Final  MRSA PCR Screening     Status: None   Collection Time: 03/15/18  2:39 AM  Result Value Ref Range Status   MRSA by PCR NEGATIVE NEGATIVE Final    Comment:        The GeneXpert MRSA Assay (FDA approved for NASAL specimens only), is one component of a comprehensive MRSA colonization surveillance program. It is not intended to diagnose MRSA infection nor to guide or monitor treatment for MRSA infections. Performed at [facility], [place] [address] [place], [place] [address]   Culture, blood (routine x 2)     Status: None   Collection Time: 03/15/18  3:35 AM  Result Value Ref Range Status   Specimen Description   Final    BLOOD RIGHT ANTECUBITAL Performed at [facility] [address], [place], [place] [address]    Special Requests   Final    BOTTLES DRAWN AEROBIC ONLY Blood Culture adequate volume   Culture   Final    NO GROWTH 5 DAYS Performed at [facility], [place] [address] [place], [place] [address]    Report Status 03/20/2018 FINAL  Final  Culture, blood (routine x 2)     Status: None   Collection Time: 03/15/18  3:35 AM  Result Value Ref Range Status   Specimen Description   Final    BLOOD RIGHT HAND Performed at [facility] [address], [place], [place] [address]    Special Requests   Final    BOTTLES DRAWN AEROBIC ONLY Blood Culture adequate volume   Culture   Final    NO GROWTH 5 DAYS Performed at [facility], [place] [address] [place], [place] [address]    Report Status 03/20/2018 FINAL  Final  Urine Culture     Status: Abnormal   Collection Time: 03/16/18  4:49 AM  Result Value Ref Range Status   Specimen Description   Final    URINE, RANDOM Performed at [facility] [address] [place], [place] [address]      Special Requests   Final    NONE Performed at [facility], [place] [address], [place], [place] [address]    Culture >=100,000 COLONIES/mL YEAST (A)  Final   Report Status 03/17/2018 FINAL  Final     Radiology Studies: Ct Head Wo Contrast  Result Date: 03/20/2018 CLINICAL DATA:  Headache. EXAM: CT HEAD WITHOUT CONTRAST TECHNIQUE: Contiguous axial images were obtained from the base of the skull through the vertex without intravenous contrast. COMPARISON:  CT scan of November 02, 2015. FINDINGS: Brain: No evidence of acute infarction, hemorrhage, hydrocephalus, extra-axial collection or mass lesion/mass effect. Vascular: No hyperdense vessel or unexpected calcification. Skull: Normal. Negative for fracture or focal lesion. Sinuses/Orbits: No acute finding. Other: None. IMPRESSION: Normal head CT. Electronically Signed   By: Marijo Conception, M.D.   On: 03/20/2018 15:08   Ct Tibia Fibula Right W Contrast  Result Date: 03/20/2018 CLINICAL DATA:  Right lower leg cellulitis. EXAM: CT OF THE LOWER RIGHT EXTREMITY WITH CONTRAST TECHNIQUE: Multidetector CT imaging of the right tibia and fibula was performed according to the standard protocol following intravenous contrast administration. COMPARISON:  CT right tibia and fibula dated March 16, 2018. CONTRAST:  143m ISOVUE-300 IOPAMIDOL (ISOVUE-300) INJECTION 61% FINDINGS: Bones/Joint/Cartilage No acute fracture or dislocation. No cortical  destruction or osteolysis. Mild knee osteoarthritis. No knee or tibiotalar joint effusion. Ligaments Suboptimally assessed by CT. Muscles and Tendons No significant muscle atrophy. The visualized flexor, extensor, peroneal, and Achilles tendons are grossly intact. Soft tissues Unchanged moderate soft tissue swelling involving the entire lateral lower leg. More circumferential moderate soft tissue swelling and skin thickening of the mid to distal lower leg appears to have worsened slightly, especially along the  medial lower leg. No fluid collection or soft tissue mass. IMPRESSION: 1. Moderate circumferential soft tissue swelling and skin thickening of the mid to distal lower leg appears to have worsened slightly, especially along the medial lower leg. No abscess. 2.  No acute osseous abnormality. Electronically Signed   By: Titus Dubin M.D.   On: 03/20/2018 15:12    Scheduled Meds: . diltiazem  240 mg Oral Daily  . docusate sodium  100 mg Oral BID  . furosemide  40 mg Intravenous BID  . insulin aspart  0-20 Units Subcutaneous TID WC  . insulin aspart  0-5 Units Subcutaneous QHS  . mineral oil-hydrophilic petrolatum   Topical BID  . nystatin   Topical BID  . pantoprazole  40 mg Oral Daily  . polyethylene glycol  17 g Oral Daily  . propranolol ER  80 mg Oral Daily  . senna  1 tablet Oral QHS   Continuous Infusions: . sodium chloride 10 mL/hr at 03/17/18 0600  .  ceFAZolin (ANCEF) IV Stopped (03/21/18 1524)     LOS: 7 days   Marylu Lund, MD [facility] Pager [phone]  If 7PM-7AM, please contact night-coverage [email or web address] Password [email or web address] 03/21/2018, 5:00 PM

## 2018-03-22 LAB — COMPREHENSIVE METABOLIC PANEL
ALT: 16 U/L (ref 0–44)
AST: 31 U/L (ref 15–41)
Albumin: 2.5 g/dL — ABNORMAL LOW (ref 3.5–5.0)
Alkaline Phosphatase: 68 U/L (ref 38–126)
Anion gap: 9 (ref 5–15)
BUN: 14 mg/dL (ref 8–23)
CHLORIDE: 99 mmol/L (ref 98–111)
CO2: 29 mmol/L (ref 22–32)
CREATININE: 0.49 mg/dL (ref 0.44–1.00)
Calcium: 8.3 mg/dL — ABNORMAL LOW (ref 8.9–10.3)
GFR calc Af Amer: >60 mL/min (ref >60)
GFR calc non Af Amer: >60 mL/min (ref >60)
Glucose, Bld: 128 mg/dL — ABNORMAL HIGH (ref 70–99)
Potassium: 3.7 mmol/L (ref 3.5–5.1)
Sodium: 137 mmol/L (ref 135–145)
Total Bilirubin: 2.5 mg/dL — ABNORMAL HIGH (ref 0.3–1.2)
Total Protein: 6.1 g/dL — ABNORMAL LOW (ref 6.5–8.1)

## 2018-03-22 LAB — GLUCOSE, CAPILLARY
GLUCOSE-CAPILLARY: 102 mg/dL — AB (ref 70–99)
GLUCOSE-CAPILLARY: 152 mg/dL — AB (ref 70–99)
GLUCOSE-CAPILLARY: 146 mg/dL — AB (ref 70–99)
GLUCOSE-CAPILLARY: 143 mg/dL — AB (ref 70–99)

## 2018-03-22 NOTE — Clinical Social Work Note (Signed)
Clinical Social Work Assessment  Patient Details  Name: Hannah Khan MRN: [id number] Date of Birth: 04/16/1956  Date of referral:  03/22/18               Reason for consult:  Facility Placement                Permission sought to share information with:  Chartered certified accountant granted to share information::  Yes, Verbal Permission Granted  Name::        Agency::     Relationship::     Contact Information:     Housing/Transportation Living arrangements for the past 2 months:  Single Family Home Source of Information:  Patient, Spouse Patient Interpreter Needed:  None Criminal Activity/Legal Involvement Pertinent to Current Situation/Hospitalization:  No - Comment as needed Significant Relationships:  Spouse Lives with:  Spouse Do you feel safe going back to the place where you live?  (PT recommending SNF) Need for family participation in patient care:  Yes (Comment)  Care giving concerns:  Patient from home with husband. Patient reported that she is independent with ambulation and ADLs at baseline. Patient reported that she has the following durable medical equipment at home (rollator, walker and shower transfer bench). PT recommending SNF.    Social Worker assessment / plan:  CSW spoke with patient/patient's husband at bedside regarding patient's discharge plans and PT recommendation for SNF. CSW explained SNF placement process and insurance authorization, patient verbalized understanding. Patient reported that she is currently getting outpatient physical therapy 3x/week for a shoulder fracture that took place in January 2019. Patient reported that she wanted to speak with her physical therapist about potentially discharging home and having her physical therapist come into the home. Patient's husband reported that he was helping patient at home but he is limited in what he can do to assist patient. Patient reported that she is agreeable to getting SNF options in  case she needs to discharge to a SNF for ST rehab. CSW agreed to complete patient's FL2 and will follow up with bed offers.  CSW completed patient's FL2 and will follow up with bed offers.  CSW will continue to follow and assist with discharge planning.  Employment status:  Therapist, music:  Managed Care PT Recommendations:  [place] / Referral to community resources:  [place]  Patient/Family's Response to care:  [name] appreciative of CSW assistance with discharge planning.   Patient/Family's Understanding of and Emotional Response to Diagnosis, Current Treatment, and Prognosis:  Patient presented calm and verbalized understanding of current treatment plan. Patient apprehensive about potentially discharging to SNF but is open to receiving SNF options. Patient reported that she may be able to return home with home health but wants to speak with current physical therapist before making a final decision.  Emotional Assessment Appearance:  Appears stated age Attitude/Demeanor/Rapport:  Engaged Affect (typically observed):  Appropriate, Calm Orientation:  Oriented to Self, Oriented to Situation, Oriented to Place, Oriented to  Time Alcohol / Substance use:  Not Applicable Psych involvement (Current and /or in the community):  No (Comment)  Discharge Needs  Concerns to be addressed:  Care Coordination Readmission within the last 30 days:  No Current discharge risk:  Physical Impairment Barriers to Discharge:  Continued Medical Work up, [employer], LCSW 03/22/2018, 10:38 AM

## 2018-03-22 NOTE — Plan of Care (Signed)
  Problem: Elimination: Goal: Will not experience complications related to bowel motility Outcome: Progressing   Problem: Pain Managment: Goal: General experience of comfort will improve Outcome: Progressing   Problem: Skin Integrity: Goal: Risk for impaired skin integrity will decrease Outcome: Progressing

## 2018-03-22 NOTE — Progress Notes (Signed)
PROGRESS NOTE    Hannah Khan  OFB:[id number] DOB: 1956/08/24 DOA: 03/14/2018 PCP: Lujean Amel, MD    Brief Narrative:  Hannah Khan is a 62 y.o. female with a history of NASH cirrhosis, hypertension, diabetes mellitus type 2, atrial fibrillation, pancytopenia, ITP, morbid obesity.  Patient presented secondary to dyspnea and right lower extremity pain.  She was found to have cellulitis of her right lower extremity in addition to meeting sepsis criteria and having atrial fibrillation with RVR.  Patient has been managed with IV fluids, IV antibiotics, diltiazem drip.  Blood pressure is improved but patient is still in RVR.  Cardiology is consulted for help managing A. fib.  Patient also with decreased platelets in setting of acute infection.  Medical oncology consulted.  Assessment & Plan:   Principal Problem:   Severe sepsis [facility]) Active Problems:   Essential hypertension   Cirrhosis of liver, with portal HTN & Acites, by CT Abdomen 07/20/13   Atrial fibrillation with rapid ventricular response (HCC)   Permanent atrial fibrillation (HCC)   Type 2 diabetes mellitus without complication, without long-term current use of insulin (HCC)   Chronic diastolic CHF (congestive heart failure) (HCC)   Thrombocytopenia (HCC)   Cellulitis of right lower extremity   Type 2 diabetes mellitus with other specified complication (HCC)  Severe sepsis Secondary to cellulitis of her right leg.  Concern for bacteremia.  No abscess on CT scan.  Hypotension has resolved with IV antibiotics and initial fluid resuscitation. Procalcitonin trended down. Complicated by atrial fibrillation with RVR. Stable at present. No leukocytosis ID recs for ancef for total of 10 days Afebrile  Cellulitis of the right lower leg No abscess. Blood cultures without results to date. Slightly worsened today. Venous duplex negative for DVT. Vancomycin and Zosyn discontinued and transitioned to Cefazolin -Infectious disease  recommendations for ancef for total 10 days course -Continue to follow blood cultures -Continue Lasix as tolerated  Permanent atrial fibrillation with RVR In setting of acute infection.  Cardiology consulted. Transitioned to Cardizem PO and continued on propranolol. Remains stable and rate controlled  Pancytopenia ITP Patient follows with Dr. Alen Blew as an outpatient.  Platelets of around 30,000 on admission. Platelets of 46k Recheck CBC in AM  NASH cirrhosis AST and ALT are not significantly elevated in setting of cirrhosis.  Bilirubin is up from baseline on admission but now trending down. Weight is up significantly. UOP of 572m over last 24 hours with unmeasured output. Weight down 12 lbs -Continue with fluid restricted diet -continue to diurese -Recheck LFT in AM  Hyperbilirubinemia Mostly indirect. LDH mildly elevated. Hemoglobin stable. Bilirubin noted to have improved Repeat lft in am  Diabetes mellitus, type 2 -Continue sliding scale insulin as needed -Stable at present  Essential hypertension Patient is on diltiazem and propranolol as an outpatient.  Pressure has been low in setting of acute infection.  BP remains stable at present  Chronic pain -Continue oxycodone as needed  Hallucinations Unknown etiology. Possibly related to fentanyl. Patient with yeast on urinalysis. No urinary symptoms but could possibly be contributing. Hallucinations non-existent this AM.  Alert and oriented at present  Diplopia Recent onset. Exam remarkable for double vision. No lid lag. No neurological deficits at this time CT head reviewed, negative   Funguria Started on fluconazole. Discontinued by ID. Stable at present  Morbid obesity Body mass index is 58.39 kg/m. Stable currently  DVT prophylaxis: SCD's Code Status: Full Family Communication: Pt in room, family at bedside Disposition Plan: SNF,  timing uncetain  Consultants:   ID  Cardiology  Procedures:       Antimicrobials: Anti-infectives (From admission, onward)   Start     Dose/Rate Route Frequency Ordered Stop   03/19/18 1600  fluconazole (DIFLUCAN) tablet 200 mg  Status:  Discontinued     200 mg Oral Daily 03/19/18 1422 03/19/18 1624   03/19/18 1430  fluconazole (DIFLUCAN) tablet 200 mg  Status:  Discontinued     200 mg Oral Daily 03/19/18 1422 03/19/18 1422   03/18/18 2200  ceFAZolin (ANCEF) IVPB 2g/100 mL premix     2 g 200 mL/hr over 30 Minutes Intravenous Every 8 hours 03/18/18 1748     03/15/18 1800  vancomycin (VANCOCIN) 1,500 mg in sodium chloride 0.9 % 500 mL IVPB  Status:  Discontinued     1,500 mg 250 mL/hr over 120 Minutes Intravenous Every 24 hours 03/15/18 0242 03/18/18 1723   03/15/18 0600  piperacillin-tazobactam (ZOSYN) IVPB 3.375 g  Status:  Discontinued     3.375 g 12.5 mL/hr over 240 Minutes Intravenous Every 8 hours 03/15/18 0237 03/18/18 1723   03/14/18 2351  vancomycin (VANCOCIN) 1000 MG powder    Note to Pharmacy:  Miguel Rota   : cabinet override      03/14/18 2351 03/15/18 1159   03/14/18 2347  vancomycin (VANCOCIN) 1-5 GM/200ML-% IVPB  Status:  Discontinued    Note to Pharmacy:  Miguel Rota   : cabinet override      03/14/18 2347 03/14/18 2353   03/14/18 2300  vancomycin (VANCOCIN) 2,000 mg in sodium chloride 0.9 % 500 mL IVPB     2,000 mg 250 mL/hr over 120 Minutes Intravenous  Once 03/14/18 2248 03/15/18 0732   03/14/18 2300  piperacillin-tazobactam (ZOSYN) IVPB 3.375 g     3.375 g 100 mL/hr over 30 Minutes Intravenous  Once 03/14/18 2248 03/14/18 2346      Subjective: Without complaints   Objective: Vitals:   03/21/18 2145 03/22/18 0413 03/22/18 1103 03/22/18 1254  BP: 123/66 (!) 142/66 135/71 138/71  Pulse: 94 99 (!) 105 97  Resp: 20 20 16 18   Temp: 97.7 F (36.5 C) 98 F (36.7 C)  97.9 F (36.6 C)  TempSrc:  Oral  Oral  SpO2: 98% 97% 98% 95%  Weight:  (!) 166.7 kg (367 lb 9.6 oz)    Height:        Intake/Output Summary (Last 24  hours) at 03/22/2018 1659 Last data filed at 03/22/2018 1632 Gross per 24 hour  Intake 2243.33 ml  Output 2450 ml  Net -206.67 ml   Filed Weights   03/20/18 0507 03/21/18 0551 03/22/18 0413  Weight: (!) 169.1 kg (372 lb 12.8 oz) (!) 167.1 kg (368 lb 6.2 oz) (!) 166.7 kg (367 lb 9.6 oz)    Examination: General exam: Awake, laying in bed, in nad Respiratory system: Normal respiratory effort, no wheezing Cardiovascular system: regular rate, s1, s2 Gastrointestinal system: Soft, nondistended, positive BS Central nervous system: CN2-12 grossly intact, strength intact Extremities: Perfused, no clubbing Skin: Normal skin turgor, RLE erythema and swelling noted, improving Psychiatry: Mood normal // no visual hallucinations    Data Reviewed: I have personally reviewed following labs and imaging studies  CBC: Recent Labs  Lab 03/16/18 0319 03/17/18 0308 03/18/18 0505 03/19/18 0529 03/21/18 0502  WBC 4.7 6.5 8.1 8.2 6.4  HGB 12.3 12.4 12.7 11.6* 11.0*  HCT 37.3 36.8 37.1 34.4* 32.8*  MCV 92.6 90.9 89.2 88.7 91.4  PLT 17* 28*  32* 32* 46*   Basic Metabolic Panel: Recent Labs  Lab 03/16/18 0319 03/17/18 0308 03/18/18 0505 03/19/18 0529 03/20/18 0453 03/21/18 0502 03/22/18 0523  NA 135 134* 134* 134*  --  138 137  K 3.8 4.2 4.0 3.4*  --  3.6 3.7  CL 107 103 104 100  --  100 99  CO2 22 21* 24 24  --  29 29  GLUCOSE 131* 131* 132* 133*  --  119* 128*  BUN 35* 37* 29* 22  --  16 14  CREATININE 0.95 0.97 0.74 0.66 0.67 0.54 0.49  CALCIUM 8.6* 8.8* 8.7* 8.7*  --  8.5* 8.3*  MG 2.3  --   --   --   --   --   --   PHOS 3.0  --   --   --   --   --   --    GFR: Estimated Creatinine Clearance: 119.2 mL/min (by C-G formula based on SCr of 0.49 mg/dL). Liver Function Tests: Recent Labs  Lab 03/16/18 0319 03/18/18 0505 03/19/18 1121 03/22/18 0523  AST 65* 38 28 31  ALT 36 28 22 16   ALKPHOS 47 52 58 68  BILITOT 6.0*  6.0* 3.6* 2.6* 2.5*  PROT 6.2* 6.2* 6.2* 6.1*  ALBUMIN 3.0*  2.8* 2.7* 2.5*   No results for input(s): LIPASE, AMYLASE in the last 168 hours. Recent Labs  Lab 03/21/18 0814  AMMONIA 35   Coagulation Profile: No results for input(s): INR, PROTIME in the last 168 hours. Cardiac Enzymes: No results for input(s): CKTOTAL, CKMB, CKMBINDEX, TROPONINI in the last 168 hours. BNP (last 3 results) No results for input(s): PROBNP in the last 8760 hours. HbA1C: No results for input(s): HGBA1C in the last 72 hours. CBG: Recent Labs  Lab 03/21/18 1605 03/21/18 2147 03/22/18 0725 03/22/18 1138 03/22/18 1636  GLUCAP 133* 121* 102* 152* 146*   Lipid Profile: No results for input(s): CHOL, HDL, LDLCALC, TRIG, CHOLHDL, LDLDIRECT in the last 72 hours. Thyroid Function Tests: No results for input(s): TSH, T4TOTAL, FREET4, T3FREE, THYROIDAB in the last 72 hours. Anemia Panel: No results for input(s): VITAMINB12, FOLATE, FERRITIN, TIBC, IRON, RETICCTPCT in the last 72 hours. Sepsis Labs: Recent Labs  Lab 03/16/18 0319 03/17/18 0308  PROCALCITON 27.30 19.21    Recent Results (from the past 240 hour(s))  Blood culture (routine x 2)     Status: None   Collection Time: 03/14/18 10:30 PM  Result Value Ref Range Status   Specimen Description   Final    BLOOD BLOOD LEFT ARM Performed at [facility], [address] [place], [place] [address]    Special Requests   Final    BOTTLES DRAWN AEROBIC AND ANAEROBIC Blood Culture results may not be optimal due to an inadequate volume of blood received in culture bottles Performed at [facility], [address] [place], [place] [address]    Culture   Final    NO GROWTH 5 DAYS Performed at [facility], [place] [address], [place], [place] [address]    Report Status 03/20/2018 FINAL  Final  MRSA PCR Screening     Status: None   Collection Time: 03/15/18  2:39 AM  Result Value Ref Range Status   MRSA by PCR NEGATIVE NEGATIVE Final    Comment:        The GeneXpert MRSA Assay  (FDA approved for NASAL specimens only), is one component of a comprehensive MRSA colonization  surveillance program. It is not intended to diagnose MRSA infection nor to guide or monitor treatment for MRSA infections. Performed at [facility], [place] [address] [place], [place] [address]   Culture, blood (routine x 2)     Status: None   Collection Time: 03/15/18  3:35 AM  Result Value Ref Range Status   Specimen Description   Final    BLOOD RIGHT ANTECUBITAL Performed at [place] [address] [place], [place] [address]    Special Requests   Final    BOTTLES DRAWN AEROBIC ONLY Blood Culture adequate volume   Culture   Final    NO GROWTH 5 DAYS Performed at [facility], [place] [address], [place], [place] [address]    Report Status 03/20/2018 FINAL  Final  Culture, blood (routine x 2)     Status: None   Collection Time: 03/15/18  3:35 AM  Result Value Ref Range Status   Specimen Description   Final    BLOOD RIGHT HAND Performed at [place] [address], [place], [place] [address]    Special Requests   Final    BOTTLES DRAWN AEROBIC ONLY Blood Culture adequate volume   Culture   Final    NO GROWTH 5 DAYS Performed at [facility], [place] [address] [place], [place] [address]    Report Status 03/20/2018 FINAL  Final  Urine Culture     Status: Abnormal   Collection Time: 03/16/18  4:49 AM  Result Value Ref Range Status   Specimen Description   Final    URINE, RANDOM Performed at [place] [address] [place], [place] [address]    Special Requests   Final    NONE Performed at [facility], [place] [address], [place],  [address]    Culture >=100,000 COLONIES/mL YEAST (A)  Final   Report Status 03/17/2018 FINAL  Final     Radiology Studies: No results found.  Scheduled Meds: . diltiazem  240 mg Oral Daily  . docusate sodium  100 mg Oral  BID  . furosemide  40 mg Intravenous BID  . insulin aspart  0-20 Units Subcutaneous TID WC  . insulin aspart  0-5 Units Subcutaneous QHS  . mineral oil-hydrophilic petrolatum   Topical BID  . nystatin   Topical BID  . pantoprazole  40 mg Oral Daily  . polyethylene glycol  17 g Oral Daily  . propranolol ER  80 mg Oral Daily  . senna  1 tablet Oral QHS   Continuous Infusions: . sodium chloride 10 mL/hr at 03/17/18 0600  .  ceFAZolin (ANCEF) IV Stopped (03/22/18 1450)     LOS: 8 days   Marylu Lund, MD [facility] Pager [phone]  If 7PM-7AM, please contact night-coverage [email or web address] Password [email or web address] 03/22/2018, 4:59 PM

## 2018-03-22 NOTE — NC FL2 (Signed)
[place] LEVEL OF CARE SCREENING TOOL     IDENTIFICATION  Patient Name: Hannah Khan Birthdate: 03-31-1956 Sex: female Admission Date (Current Location): 03/14/2018  [facility] and [place] Number:  Herbalist and Address:  [facility],  [place], [place]      Provider Number: [id number]  Attending Physician Name and Address:  Donne Hazel, MD  Relative Name and Phone Number:       Current Level of Care: Hospital Recommended Level of Care: [place] Prior Approval Number:    Date Approved/Denied:   PASRR Number: [id number]  Discharge Plan: SNF    Current Diagnoses: Patient Active Problem List   Diagnosis Date Noted  . Type 2 diabetes mellitus with other specified complication ([place])   . Cellulitis of right lower extremity 03/15/2018  . Severe sepsis ([place]) 03/14/2018  . Acute hypoxemic respiratory failure ([place]) 11/01/2016  . Community acquired pneumonia of right lower lobe of lung ([place]) 10/31/2016  . Muscular abdominal pain in right upper quadrant 07/11/2016  . Chronic diastolic CHF (congestive heart failure) ([place]) 07/10/2016  . Thrombocytopenia ([place]) 07/10/2016  . Permanent atrial fibrillation ([place]) 01/30/2016  . Type 2 diabetes mellitus without complication, without Jalin [place]-term current use of insulin ([place]) 01/30/2016  . Acute on chronic diastolic CHF (congestive heart failure), NYHA class 2 ([place]) 01/26/2016  . Morbid obesity- BMI 56 12/10/2015  . Atrial fibrillation with rapid ventricular response ([place]) 12/08/2015  . Melena 01/31/2015  . Aortic heart murmur 01/31/2015  . Cirrhosis of liver, with portal HTN & Acites, by CT Abdomen 07/20/13 07/23/2013  . Pancytopenia ([place]) 07/23/2013  . Obstructive sleep apnea 08/07/2007  . Essential hypertension 08/07/2007  . ALLERGIC RHINITIS 08/07/2007    Orientation RESPIRATION BLADDER Height & Weight     Self, Time, Situation, Place  Normal Continent  Weight: (!) 367 lb 9.6 oz (166.7 kg) Height:  5' 7"  (170.2 cm)  BEHAVIORAL SYMPTOMS/MOOD NEUROLOGICAL BOWEL NUTRITION STATUS      Continent Diet(see dc summary)  AMBULATORY STATUS COMMUNICATION OF NEEDS Skin   Limited Assist Verbally Other (Comment)(Right medial thigh with epidermal peeling, Right LE with erythema and tissue poss posteriorly bariatric)with low air loss feature to assist in the drying and comfort.Topical order for Aquaphor ointment to the right medial thigh are provided and I     ) will provide InterDry Ag+ for the lower extremity.  This antimicrobial textile is non adherent and will lend its absorbent and antimicrobial properties to the systemic POC                         Personal Care Assistance Level of Assistance  Bathing, Feeding, Dressing Bathing Assistance: Maximum assistance Feeding assistance: Independent Dressing Assistance: Maximum assistance     Functional Limitations Info  Sight, Hearing, Speech Sight Info: Adequate Hearing Info: Adequate Speech Info: Adequate    SPECIAL CARE FACTORS FREQUENCY  PT (By licensed PT), OT (By licensed OT)     PT Frequency: 5x/week OT Frequency: 5x/week            Contractures Contractures Info: Not present    Additional Factors Info  Code Status, Allergies Code Status Info: Full Code Allergies Info: Shellfish Allergy;Ibuprofen;Tylenol Acetaminophen;Celecoxib;Barium-containing Compounds;Prednisone;Statins;           Current Medications (03/22/2018):  This is the current hospital active medication list Current Facility-Administered Medications  Medication Dose Route Frequency Provider Last Rate Last Dose  . 0.9 %  sodium chloride infusion   Intravenous Continuous Mariel Aloe, MD 10 mL/hr at 03/17/18 0600    . acetaminophen (TYLENOL) tablet 650 mg  650 mg Oral Q6H PRN Mariel Aloe, MD   650 mg at 03/21/18 1813   Or  . acetaminophen (TYLENOL) suppository 650 mg  650 mg Rectal Q6H PRN Mariel Aloe,  MD      . albuterol (PROVENTIL) (2.5 MG/3ML) 0.083% nebulizer solution 2.5 mg  2.5 mg Nebulization Q2H PRN Mariel Aloe, MD   2.5 mg at 03/18/18 2247  . ceFAZolin (ANCEF) IVPB 2g/100 mL premix  2 g Intravenous Q8H Nilda Simmer, RPH   Stopped at 03/22/18 3790  . diltiazem (CARDIZEM CD) 24 hr capsule 240 mg  240 mg Oral Daily Pixie Casino, MD   240 mg at 03/21/18 1104  . docusate sodium (COLACE) capsule 100 mg  100 mg Oral BID Mariel Aloe, MD   100 mg at 03/21/18 2215  . furosemide (LASIX) injection 40 mg  40 mg Intravenous BID Mariel Aloe, MD   40 mg at 03/22/18 0814  . hydrOXYzine (ATARAX/VISTARIL) tablet 25 mg  25 mg Oral Q6H PRN Mariel Aloe, MD   25 mg at 03/20/18 2141  . insulin aspart (novoLOG) injection 0-20 Units  0-20 Units Subcutaneous TID WC Mariel Aloe, MD   3 Units at 03/21/18 1813  . insulin aspart (novoLOG) injection 0-5 Units  0-5 Units Subcutaneous QHS Mariel Aloe, MD      . mineral oil-hydrophilic petrolatum (AQUAPHOR) ointment   Topical BID Mariel Aloe, MD      . nystatin (MYCOSTATIN/NYSTOP) topical powder   Topical BID Mariel Aloe, MD      . ondansetron [facility]) injection 4 mg  4 mg Intravenous Q6H PRN Mariel Aloe, MD   4 mg at 03/16/18 0415  . oxyCODONE (Oxy IR/ROXICODONE) immediate release tablet 5 mg  5 mg Oral Q6H PRN Mariel Aloe, MD   5 mg at 03/21/18 1006  . pantoprazole (PROTONIX) EC tablet 40 mg  40 mg Oral Daily Mariel Aloe, MD   40 mg at 03/21/18 1104  . polyethylene glycol (MIRALAX / GLYCOLAX) packet 17 g  17 g Oral Daily Mariel Aloe, MD   17 g at 03/21/18 1105  . propranolol ER (INDERAL LA) 24 hr capsule 80 mg  80 mg Oral Daily Pixie Casino, MD   80 mg at 03/21/18 1102  . senna (SENOKOT) tablet 8.6 mg  1 tablet Oral QHS Mariel Aloe, MD   8.6 mg at 03/21/18 2215  . traZODone (DESYREL) tablet 100 mg  100 mg Oral QHS PRN Mariel Aloe, MD   100 mg at 03/21/18 2302     Discharge Medications: Please see  discharge summary for a list of discharge medications.  Relevant Imaging Results:  Relevant Lab Results:   Additional Information SSN [id number]  Burnis Medin, LCSW

## 2018-03-23 ENCOUNTER — Encounter (HOSPITAL_COMMUNITY): Payer: Self-pay

## 2018-03-23 LAB — CBC
HCT: 35.3 % — ABNORMAL LOW (ref 36.0–46.0)
Hemoglobin: 11.6 g/dL — ABNORMAL LOW (ref 12.0–15.0)
MCH: 30.1 pg (ref 26.0–34.0)
MCHC: 32.9 g/dL (ref 30.0–36.0)
MCV: 91.5 fL (ref 78.0–100.0)
PLATELETS: 56 10*3/uL — AB (ref 150–400)
RBC: 3.86 MIL/uL — ABNORMAL LOW (ref 3.87–5.11)
RDW: 15.6 % — ABNORMAL HIGH (ref 11.5–15.5)
WBC: 5.8 10*3/uL (ref 4.0–10.5)

## 2018-03-23 LAB — COMPREHENSIVE METABOLIC PANEL
ALBUMIN: 2.5 g/dL — AB (ref 3.5–5.0)
ALK PHOS: 69 U/L (ref 38–126)
ALT: 13 U/L (ref 0–44)
AST: 31 U/L (ref 15–41)
Anion gap: 8 (ref 5–15)
BUN: 15 mg/dL (ref 8–23)
CALCIUM: 8.5 mg/dL — AB (ref 8.9–10.3)
CHLORIDE: 99 mmol/L (ref 98–111)
CO2: 33 mmol/L — ABNORMAL HIGH (ref 22–32)
CREATININE: 0.54 mg/dL (ref 0.44–1.00)
GFR calc Af Amer: >60 mL/min (ref >60)
GFR calc non Af Amer: >60 mL/min (ref >60)
GLUCOSE: 131 mg/dL — AB (ref 70–99)
Potassium: 3.7 mmol/L (ref 3.5–5.1)
SODIUM: 140 mmol/L (ref 135–145)
Total Bilirubin: 2.4 mg/dL — ABNORMAL HIGH (ref 0.3–1.2)
Total Protein: 6.4 g/dL — ABNORMAL LOW (ref 6.5–8.1)

## 2018-03-23 LAB — GLUCOSE, CAPILLARY: GLUCOSE-CAPILLARY: 106 mg/dL — AB (ref 70–99)

## 2018-03-23 MED ORDER — CEPHALEXIN 500 MG PO CAPS: 500.0000 mg | ORAL_CAPSULE | Freq: Four times a day (QID) | ORAL | 0 refills | Status: AC

## 2018-03-23 MED ORDER — FUROSEMIDE 40 MG PO TABS: 40.0000 mg | ORAL_TABLET | Freq: Every day | ORAL | 0 refills | Status: DC

## 2018-03-23 MED ORDER — HYDROXYZINE HCL 10 MG PO TABS: 10.0000 mg | ORAL_TABLET | Freq: Three times a day (TID) | ORAL | 0 refills | Status: DC | PRN

## 2018-03-23 NOTE — Progress Notes (Signed)
Per CSW, pt wants to return home instead of SNF. Pt offered choice for home health services and [facility] chosen. [facility] rep alerted of referral. Marney Doctor RN,BSN,NCM [phone]

## 2018-03-23 NOTE — Progress Notes (Signed)
CSW followed up with patient/patient's husband and provided bed offers. Patient reported that she is interested in returning home versus going to SNF. Patient currently working with physical therapy and hopeful to go home. CSW agreed to notify patient's RNCM.   CSW notified patient's RNCM.  CSW signing off, no other needs identified at this time. Please re-consult if new needs arise.   Abundio Miu, [place] Social Worker [facility] Cell#: [phone]

## 2018-03-23 NOTE — Discharge Summary (Signed)
Physician Discharge Summary  Hannah Khan LKG:[id number] DOB: 1955-10-03 DOA: 03/14/2018  PCP: Lujean Amel, MD  Admit date: 03/14/2018 Discharge date: 03/23/2018  Admitted From: Home Disposition:  Home  Recommendations for Outpatient Follow-up:  1. Follow up with PCP in 1-2 weeks:  Home Health:PT, RN  Equipment/Devices:Rolling walker with 5 in wheels    Discharge Condition:Stable CODE STATUS:Full Diet recommendation: Diabetic   Brief/Interim Summary: 62 y.o.female with a history of NASH cirrhosis, hypertension, diabetes mellitus type 2, atrial fibrillation, pancytopenia, ITP, morbid obesity. Patient presented secondary to dyspnea and right lower extremity pain. She was found to have cellulitis of her right lower extremity in addition to meeting sepsis criteria and having atrial fibrillation with RVR. Patient has been managed with IV fluids, IV antibiotics, diltiazem drip. Blood pressure is improved but patient is still in RVR. Cardiology is consulted for help managing A. fib. Patient also with decreased platelets in setting of acute infection.   Severe sepsis -Secondary to cellulitis of her right leg. Blood cx neg. No abscess on CT scan. -Hypotension has resolved with IV antibiotics and initial fluid resuscitation. -Procalcitonin trended down. Complicated by atrial fibrillation with RVR. -Stable at present. No leukocytosis -ID recs for continued ancef. Given morbid obesity, diabetes, will provide 4 more days of keflex to complete total 14 days of tx -Afebrile  Cellulitis of the right lower leg -No abscess.  -Blood cultures negative.  -Venous duplex negative for DVT.  -Vancomycin and Zosyn discontinued and transitioned to Cefazolin -Infectious disease recommendations for ancef for total 10 days course -Given morbid obesity, diabetes, extensiveness of cellulitis, will provide 4 more days of keflex to complete total 14 days treatment -Continued on Lasix as  tolerated  Permanent atrial fibrillation with RVR -In setting of acute infection. Cardiology consulted. Transitioned to Cardizem PO and continued on propranolol. -Remains stable and rate controlled  Pancytopenia ITP -Patient follows with Dr. Alen Blew as an outpatient.  -Platelets of around 30,000 on admission. Platelets are trending up to 56k at time of discharge -Continue to follow CBC on discharge  NASH cirrhosis -AST and ALT are not significantly elevated in setting of cirrhosis.  -Bilirubin is up from baseline on admission but now trending down.  -Continue with fluid restricted diet -continue to diurese -Would continue to follow LFT's on discharge  Hyperbilirubinemia -Mostly indirect. LDH mildly elevated. Hemoglobin stable.  -Bilirubin noted to have improved  Diabetes mellitus, type 2 -Continued on sliding scale insulin as needed while in hospital -Stable at present  Essential hypertension -Patient is on diltiazem and propranolol as an outpatient.  -Pressure has been low in setting of acute infection.  -BP currently stable  Chronic pain -Continued oxycodone as needed per home regimen  Hallucinations -Unknown etiology and noted to be transient. Possibly related to narcotic use.   -Alert and oriented at present  Diplopia -Recent onset. Exam remarkable for double vision. No lid lag. No neurological deficits at this time -CT head reviewed, negative   Funguria -Initially started on fluconazole. Discontinued by ID. -Stable at present  Morbid obesity -Body mass index is 58.39 kg/m. -Stable currently -Weight loss was encouraged  Discharge Diagnoses:  Principal Problem:   Severe sepsis [facility]) Active Problems:   Essential hypertension   Cirrhosis of liver, with portal HTN & Acites, by CT Abdomen 07/20/13   Atrial fibrillation with rapid ventricular response (HCC)   Permanent atrial fibrillation (HCC)   Type 2 diabetes mellitus without complication,  without long-term current use of insulin (HCC)   Chronic diastolic  CHF (congestive heart failure) (HCC)   Thrombocytopenia (HCC)   Cellulitis of right lower extremity   Type 2 diabetes mellitus with other specified complication [facility])    Discharge Instructions   Allergies as of 03/23/2018      Reactions   Celecoxib Other (See Comments)   "speech slurred" with celebrex   Shellfish Allergy Swelling, Rash   TINGLING AND SWELLING OF LIPS. LARGE WHELPS QUARTER-SIZE   Barium-containing Compounds Other (See Comments)   TACHYCARDIA   Prednisone Other (See Comments)   TACHYCARDIA   Statins Other (See Comments)   AGGRAVATED FIBROMYALGIA   Ibuprofen Other (See Comments)   UNSPECIFIED SPECIFIC REACTION >> "DUE TO PLATELETS"   Tylenol [acetaminophen] Other (See Comments)   UNSPECIFIED SPECIFIC REACTION >> "DUE TO PLATELETS"      Medication List    TAKE these medications   bumetanide 2 MG tablet Commonly known as:  BUMEX Take 2-4 mg by mouth See admin instructions. Take 4 mgs by mouth once daily, may take an additional 80m as needed for swelling   calcium carbonate 1250 (500 Ca) MG tablet Commonly known as:  OS-CAL - dosed in mg of elemental calcium Take 1 tablet by mouth daily with breakfast.   cephALEXin 500 MG capsule Commonly known as:  KEFLEX Take 1 capsule (500 mg total) by mouth 4 (four) times daily for 4 days.   cyclobenzaprine 10 MG tablet Commonly known as:  FLEXERIL Take 10 mg by mouth 3 (three) times daily as needed for muscle spasms.   diltiazem 240 MG 24 hr capsule Commonly known as:  CARDIZEM CD Take 1 capsule (240 mg total) by mouth daily.   ergocalciferol 50000 units capsule Commonly known as:  VITAMIN D2 Take 50,000 Units by mouth 2 (two) times a week. On Wednesday and Saturday   furosemide 40 MG tablet Commonly known as:  LASIX Take 1 tablet (40 mg total) by mouth daily.   hydrOXYzine 10 MG tablet Commonly known as:  ATARAX/VISTARIL Take 1 tablet (10  mg total) by mouth 3 (three) times daily as needed for anxiety.   JARDIANCE 25 MG Tabs tablet Generic drug:  empagliflozin Take 25 mg by mouth daily.   KLOR-CON M20 20 MEQ tablet Generic drug:  potassium chloride SA Take 20 mEq by mouth daily.   oxyCODONE-acetaminophen 5-325 MG tablet Commonly known as:  PERCOCET/ROXICET Take 1 tablet by mouth every 8 (eight) hours as needed for severe pain.   propranolol 10 MG tablet Commonly known as:  INDERAL TAKE 1 TABLET BY MOUTH TWICE A DAY   spironolactone 25 MG tablet Commonly known as:  ALDACTONE Take 25 mg by mouth daily.      Follow-up Information    Koirala, Dibas, MD. Schedule an appointment as soon as possible for a visit in 1 week(s).   Specialty:  Family Medicine Contact information: [phone]        RSkeet Latch MD .   Specialty:  Cardiology Contact information: [phone]         Allergies  Allergen Reactions  . Celecoxib Other (See Comments)    "speech slurred" with celebrex  . Shellfish Allergy Swelling and Rash    TINGLING AND SWELLING OF LIPS. LARGE WHELPS QUARTER-SIZE  . Barium-Containing Compounds Other (See Comments)    TACHYCARDIA  . Prednisone Other (See Comments)    TACHYCARDIA  . Statins Other (See Comments)    AGGRAVATED  FIBROMYALGIA  . Ibuprofen Other (See Comments)    UNSPECIFIED SPECIFIC REACTION >> "DUE TO PLATELETS"  . Tylenol [Acetaminophen] Other (See Comments)    UNSPECIFIED SPECIFIC REACTION >> "DUE TO PLATELETS"    Consultations:  ID  Cardiology  Procedures/Studies: Dg Chest 2 View  Result Date: 03/14/2018 CLINICAL DATA:  62 year old female with fever and vomiting. EXAM: CHEST - 2 VIEW COMPARISON:  Chest radiograph dated 10/31/2016 FINDINGS: There is cardiomegaly with mild vascular congestion and a small pleural effusion. Superimposed pneumonia is not  excluded. There is no focal consolidation or pneumothorax. No acute osseous pathology. IMPRESSION: Cardiomegaly findings of CHF. Pneumonia is not excluded. Clinical correlation is recommended. Electronically Signed   By: Anner Crete M.D.   On: 03/14/2018 23:58   Dg Tibia/fibula Right  Result Date: 03/14/2018 CLINICAL DATA:  Swelling and pain in the right lower extremity. EXAM: RIGHT TIBIA AND FIBULA - 2 VIEW COMPARISON:  None. FINDINGS: Osteoarthritis of the right knee with medial femorotibial joint space narrowing and spurring of the tibial spines and tibial plateau. Additionally there is patellofemoral joint space narrowing spurring. No joint effusion is noted. The ankle mortise and included subtalar joints are unremarkable. There is mild soft tissue induration of the leg compatible soft tissue edema. IMPRESSION: Mild diffuse soft tissue edema of the right leg. Osteoarthritis of the right knee. No acute osseous abnormality. Electronically Signed   By: Ashley Royalty M.D.   On: 03/14/2018 23:59   Ct Head Wo Contrast  Result Date: 03/20/2018 CLINICAL DATA:  Headache. EXAM: CT HEAD WITHOUT CONTRAST TECHNIQUE: Contiguous axial images were obtained from the base of the skull through the vertex without intravenous contrast. COMPARISON:  CT scan of November 02, 2015. FINDINGS: Brain: No evidence of acute infarction, hemorrhage, hydrocephalus, extra-axial collection or mass lesion/mass effect. Vascular: No hyperdense vessel or unexpected calcification. Skull: Normal. Negative for fracture or focal lesion. Sinuses/Orbits: No acute finding. Other: None. IMPRESSION: Normal head CT. Electronically Signed   By: Marijo Conception, M.D.   On: 03/20/2018 15:08   Ct Tibia Fibula Right W Contrast  Result Date: 03/20/2018 CLINICAL DATA:  Right lower leg cellulitis. EXAM: CT OF THE LOWER RIGHT EXTREMITY WITH CONTRAST TECHNIQUE: Multidetector CT imaging of the right tibia and fibula was performed according to the standard  protocol following intravenous contrast administration. COMPARISON:  CT right tibia and fibula dated March 16, 2018. CONTRAST:  142m ISOVUE-300 IOPAMIDOL (ISOVUE-300) INJECTION 61% FINDINGS: Bones/Joint/Cartilage No acute fracture or dislocation. No cortical destruction or osteolysis. Mild knee osteoarthritis. No knee or tibiotalar joint effusion. Ligaments Suboptimally assessed by CT. Muscles and Tendons No significant muscle atrophy. The visualized flexor, extensor, peroneal, and Achilles tendons are grossly intact. Soft tissues Unchanged moderate soft tissue swelling involving the entire lateral lower leg. More circumferential moderate soft tissue swelling and skin thickening of the mid to distal lower leg appears to have worsened slightly, especially along the medial lower leg. No fluid collection or soft tissue mass. IMPRESSION: 1. Moderate circumferential soft tissue swelling and skin thickening of the mid to distal lower leg appears to have worsened slightly, especially along the medial lower leg. No abscess. 2.  No acute osseous abnormality. Electronically Signed   By: WTitus DubinM.D.   On: 03/20/2018 15:12   Ct Tibia Fibula Right W Contrast  Result Date: 03/16/2018 CLINICAL DATA:  Right lower leg cellulitis. EXAM: CT OF THE LOWER RIGHT EXTREMITY WITH CONTRAST TECHNIQUE: Multidetector CT imaging of the right tibia and fibula was performed according  to the standard protocol following intravenous contrast administration. COMPARISON:  Right tibia fibula x-rays dated March 14, 2018. CONTRAST:  178m ISOVUE-300 IOPAMIDOL (ISOVUE-300) INJECTION 61% FINDINGS: Bones/Joint/Cartilage No acute fracture or dislocation. No cortical destruction or osteolysis. Mild knee osteoarthritis. No knee or tibiotalar joint effusion. Ligaments Suboptimally assessed by CT. Muscles and Tendons No significant muscle atrophy. The flexor, extensor, peroneal, and Achilles tendons are grossly intact. Soft tissues Moderate soft  tissue swelling involving the entire lateral lower leg. More circumferential moderate soft tissue swelling and skin thickening of the mid to distal lower leg. No fluid collection or soft tissue mass. IMPRESSION: 1. Moderate circumferential soft tissue swelling and skin thickening of the mid to distal lower leg, consistent with clinical history of cellulitis. No abscess. 2.  No acute osseous abnormality. Electronically Signed   By: WTitus DubinM.D.   On: 03/16/2018 13:29   Dg Chest Port 1 View  Result Date: 03/16/2018 CLINICAL DATA:  Hypoxia. EXAM: PORTABLE CHEST 1 VIEW COMPARISON:  Radiographs of March 14, 2018. FINDINGS: Stable cardiomegaly. No pneumothorax is noted. Mild central pulmonary vascular congestion is noted. No consolidative process is noted. No significant pleural effusion is noted. Bony thorax is unremarkable. IMPRESSION: Stable cardiomegaly with mild central pulmonary vascular congestion. Electronically Signed   By: JMarijo Conception M.D.   On: 03/16/2018 07:16   UKoreaAbdomen Limited Ruq  Result Date: 03/15/2018 CLINICAL DATA:  62year old female with sepsis. EXAM: ULTRASOUND ABDOMEN LIMITED RIGHT UPPER QUADRANT COMPARISON:  CT of the abdomen pelvis dated 07/10/2016 ultrasound dated 07/07/2017 FINDINGS: Gallbladder: Cholecystectomy. Common bile duct: Diameter: 12 mm Liver: Morphologic changes of cirrhosis. The liver demonstrates a heterogeneous echotexture. Portal vein is patent on color Doppler imaging with normal direction of blood flow towards the liver. IMPRESSION: 1. Cirrhosis. 2. Cholecystectomy. 3. Patent main portal vein with hepatopetal flow. 4. No ascites. Electronically Signed   By: AAnner CreteM.D.   On: 03/15/2018 04:27     Subjective: Without complaints  Discharge Exam: Vitals:   03/22/18 2100 03/23/18 0540  BP: 113/62 125/65  Pulse: 94 93  Resp: 17 20  Temp: 98.3 F (36.8 C) 98 F (36.7 C)  SpO2: 97% 96%   Vitals:   03/22/18 1254 03/22/18 2100 03/23/18  0540 03/23/18 0549  BP: 138/71 113/62 125/65   Pulse: 97 94 93   Resp: 18 17 20    Temp: 97.9 F (36.6 C) 98.3 F (36.8 C) 98 F (36.7 C)   TempSrc: Oral Oral Oral   SpO2: 95% 97% 96%   Weight:    (!) 164.8 kg (363 lb 6.4 oz)  Height:        General: Pt is alert, awake, not in acute distress Cardiovascular: RRR, S1/S2 +, no rubs, no gallops Respiratory: CTA bilaterally, no wheezing, no rhonchi Abdominal: Soft, NT, ND, bowel sounds + Extremities: no edema, no cyanosis   The results of significant diagnostics from this hospitalization (including imaging, microbiology, ancillary and laboratory) are listed below for reference.     Microbiology: Recent Results (from the past 240 hour(s))  Blood culture (routine x 2)     Status: None   Collection Time: 03/14/18 10:30 PM  Result Value Ref Range Status   Specimen Description   Final    BLOOD BLOOD LEFT ARM Performed at [facility] [id number]   Special Requests   Final    BOTTLES DRAWN AEROBIC AND ANAEROBIC Blood Culture results may not be  optimal due to an inadequate volume of blood received in culture bottles Performed at [facility], [place] [place], [place] [address]    Culture   Final    NO GROWTH 5 DAYS Performed at [facility], [place] [address], [place], [place] [address]    Report Status 03/20/2018 FINAL  Final  MRSA PCR Screening     Status: None   Collection Time: 03/15/18  2:39 AM  Result Value Ref Range Status   MRSA by PCR NEGATIVE NEGATIVE Final    Comment:        The GeneXpert MRSA Assay (FDA approved for NASAL specimens only), is one component of a comprehensive MRSA colonization surveillance program. It is not intended to diagnose MRSA infection nor to guide or monitor treatment for MRSA infections. Performed at [facility], [place] [address], [place], [place] [address]   Culture, blood (routine x 2)     Status:  None   Collection Time: 03/15/18  3:35 AM  Result Value Ref Range Status   Specimen Description   Final    BLOOD RIGHT ANTECUBITAL Performed at [facility] [address] [place], [place] [address]    Special Requests   Final    BOTTLES DRAWN AEROBIC ONLY Blood Culture adequate volume   Culture   Final    NO GROWTH 5 DAYS Performed at [facility], [place] [address] [place], [place] [address]    Report Status 03/20/2018 FINAL  Final  Culture, blood (routine x 2)     Status: None   Collection Time: 03/15/18  3:35 AM  Result Value Ref Range Status   Specimen Description   Final    BLOOD RIGHT HAND Performed at [facility] [address] [place], [place] [address]    Special Requests   Final    BOTTLES DRAWN AEROBIC ONLY Blood Culture adequate volume   Culture   Final    NO GROWTH 5 DAYS Performed at [facility], [place] [address] [place], [place] [address]    Report Status 03/20/2018 FINAL  Final  Urine Culture     Status: Abnormal   Collection Time: 03/16/18  4:49 AM  Result Value Ref Range Status   Specimen Description   Final    URINE, RANDOM Performed at [facility] [address] [place], [place] [address]    Special Requests   Final    NONE Performed at [facility], [place] [address], [place], [place] [address]    Culture >=100,000 COLONIES/mL YEAST (A)  Final   Report Status 03/17/2018 FINAL  Final     Labs: BNP (last 3 results) No results for input(s): BNP in the last 8760 hours. Basic Metabolic Panel: Recent Labs  Lab 03/18/18 0505 03/19/18 0529 03/20/18 0453 03/21/18 0502 03/22/18 0523 03/23/18 0511  NA 134* 134*  --  138 137 140  K 4.0 3.4*  --  3.6 3.7 3.7  CL 104 100  --  100 99 99  CO2 24 24  --  29 29 33*  GLUCOSE 132* 133*  --  119* 128* 131*  BUN 29* 22  --  16 14 15   CREATININE 0.74 0.66 0.67 0.54 0.49 0.54  CALCIUM 8.7* 8.7*  --  8.5* 8.3* 8.5*    Liver Function Tests: Recent Labs  Lab 03/18/18 0505 03/19/18 1121 03/22/18 0523 03/23/18 0511  AST 38 28 31 31  ALT 28 22 16 13   ALKPHOS 52 58 68 69  BILITOT 3.6* 2.6* 2.5* 2.4*  PROT 6.2* 6.2* 6.1* 6.4*  ALBUMIN 2.8* 2.7* 2.5* 2.5*   No results for input(s): LIPASE, AMYLASE in the last 168 hours. Recent Labs  Lab 03/21/18 0814  AMMONIA 35   CBC: Recent Labs  Lab 03/17/18 0308 03/18/18 0505 03/19/18 0529 03/21/18 0502 03/23/18 0511  WBC 6.5 8.1 8.2 6.4 5.8  HGB 12.4 12.7 11.6* 11.0* 11.6*  HCT 36.8 37.1 34.4* 32.8* 35.3*  MCV 90.9 89.2 88.7 91.4 91.5  PLT 28* 32* 32* 46* 56*   Cardiac Enzymes: No results for input(s): CKTOTAL, CKMB, CKMBINDEX, TROPONINI in the last 168 hours. BNP: Invalid input(s): POCBNP CBG: Recent Labs  Lab 03/22/18 0725 03/22/18 1138 03/22/18 1636 03/22/18 2057 03/23/18 0750  GLUCAP 102* 152* 146* 143* 106*   D-Dimer No results for input(s): DDIMER in the last 72 hours. Hgb A1c No results for input(s): HGBA1C in the last 72 hours. Lipid Profile No results for input(s): CHOL, HDL, LDLCALC, TRIG, CHOLHDL, LDLDIRECT in the last 72 hours. Thyroid function studies No results for input(s): TSH, T4TOTAL, T3FREE, THYROIDAB in the last 72 hours.  Invalid input(s): FREET3 Anemia work up No results for input(s): VITAMINB12, FOLATE, FERRITIN, TIBC, IRON, RETICCTPCT in the last 72 hours. Urinalysis    Component Value Date/Time   COLORURINE AMBER (A) 03/16/2018 0449   APPEARANCEUR CLOUDY (A) 03/16/2018 0449   LABSPEC 1.027 03/16/2018 0449   PHURINE 5.0 03/16/2018 0449   GLUCOSEU >=500 (A) 03/16/2018 0449   HGBUR MODERATE (A) 03/16/2018 0449   BILIRUBINUR NEGATIVE 03/16/2018 0449   KETONESUR NEGATIVE 03/16/2018 0449   PROTEINUR 30 (A) 03/16/2018 0449   UROBILINOGEN 2.0 (H) 01/31/2015 0055   NITRITE NEGATIVE 03/16/2018 0449   LEUKOCYTESUR NEGATIVE 03/16/2018 0449   Sepsis Labs Invalid input(s): PROCALCITONIN,  WBC,   LACTICIDVEN Microbiology Recent Results (from the past 240 hour(s))  Blood culture (routine x 2)     Status: None   Collection Time: 03/14/18 10:30 PM  Result Value Ref Range Status   Specimen Description   Final    BLOOD BLOOD LEFT ARM Performed at [facility], [place] [place], [place] [address]    Special Requests   Final    BOTTLES DRAWN AEROBIC AND ANAEROBIC Blood Culture results may not be optimal due to an inadequate volume of blood received in culture bottles Performed at [facility], [place]., [place], [place] [address]    Culture   Final    NO GROWTH 5 DAYS Performed at [facility], [place] [address] [place], [place] [address]    Report Status 03/20/2018 FINAL  Final  MRSA PCR Screening     Status: None   Collection Time: 03/15/18  2:39 AM  Result Value Ref Range Status   MRSA by PCR NEGATIVE NEGATIVE Final    Comment:        The GeneXpert MRSA Assay (FDA approved for NASAL specimens only), is one component of a comprehensive MRSA colonization surveillance program. It is not intended to diagnose MRSA infection nor to guide or monitor treatment for MRSA infections. Performed at [facility], [place] [address], [place], [place] [address]   Culture, blood (routine x 2)     Status: None   Collection Time: 03/15/18  3:35 AM  Result Value Ref Range Status   Specimen Description   Final    BLOOD RIGHT  ANTECUBITAL Performed at [facility], [place] [address] [place], [place] [address]    Special Requests   Final    BOTTLES DRAWN AEROBIC ONLY Blood Culture adequate volume   Culture   Final    NO GROWTH 5 DAYS Performed at [facility], [place] [address], [place], [place] [address]    Report Status 03/20/2018 FINAL  Final  Culture, blood (routine x 2)     Status: None   Collection Time: 03/15/18  3:35 AM  Result Value Ref Range Status   Specimen Description   Final    BLOOD RIGHT  HAND Performed at [facility] [address] [place], [place] [address]    Special Requests   Final    BOTTLES DRAWN AEROBIC ONLY Blood Culture adequate volume   Culture   Final    NO GROWTH 5 DAYS Performed at [facility], [place] [address], [place], [place] [address]    Report Status 03/20/2018 FINAL  Final  Urine Culture     Status: Abnormal   Collection Time: 03/16/18  4:49 AM  Result Value Ref Range Status   Specimen Description   Final    URINE, RANDOM Performed at [facility] [address] [place], [place] [address]    Special Requests   Final    NONE Performed at [facility], [place] [address], [place],  [address]    Culture >=100,000 COLONIES/mL YEAST (A)  Final   Report Status 03/17/2018 FINAL  Final   Time spent: 34mn  SIGNED:   SMarylu Lund MD  [facility] 03/23/2018, 11:03 AM  If 7PM-7AM, please contact night-coverage [email or web address] Password [email or web address]

## 2018-03-23 NOTE — Progress Notes (Signed)
Physical Therapy Treatment Patient Details Name: Hannah Khan MRN: [id number] DOB: 1956/01/21 Today's Date: 03/23/2018    History of Present Illness 62 y.o. female with a history of NASH cirrhosis, hypertension, diabetes mellitus type 2, atrial fibrillation, pancytopenia, ITP, morbid obesity and admitted for severe sepsis and cellulitis of right lower leg.    PT Comments    Pt reports her pain is much improved today.  Pt able to ambulate improved distance and feels she will be able to function at home.  She states her spouse and friends can assist her as needed.  Recommend HHPT at this time.  Pt also states she has a RW and rollator and encouraged her to use RW for mobility upon d/c for safety. She reports she has chairs throughout her home, so she can sit down and rest as needed.    Follow Up Recommendations  Home health PT;Supervision for mobility/OOB     Equipment Recommendations  None recommended by PT(pt reports she has both RW and rollator at home)    Recommendations for Other Services       Precautions / Restrictions Precautions Precautions: Fall Precaution Comments: limited R shoulder movement due to hx of proximal humerus fx in Jan    Mobility  Bed Mobility Overal bed mobility: Needs Assistance Bed Mobility: Supine to Sit     Supine to sit: Supervision;HOB elevated Sit to supine: Supervision   General bed mobility comments: increased time and effort  Transfers Overall transfer level: Needs assistance Equipment used: Rolling walker (2 wheeled) Transfers: Sit to/from Stand Sit to Stand: Min guard         General transfer comment: verbal cues for safe technique   Ambulation/Gait Ambulation/Gait assistance: Min guard Gait Distance (Feet): 70 Feet(x2) Assistive device: Rolling walker (2 wheeled) Gait Pattern/deviations: Step-to pattern;Decreased stance time - right;Wide base of support     General Gait Details: verbal cues for step to sequence for pain  control, pt tolerating mobility better today, required one seated rest break (70 feet x2)   Stairs             Wheelchair Mobility    Modified Rankin (Stroke Patients Only)       Balance                                            Cognition Arousal/Alertness: Awake/alert Behavior During Therapy: WFL for tasks assessed/performed Overall Cognitive Status: Within Functional Limits for tasks assessed                                        Exercises      General Comments        Pertinent Vitals/Pain Pain Assessment: 0-10 Pain Score: 3  Pain Location: R leg, R shoulder Pain Descriptors / Indicators: Aching;Tender Pain Intervention(s): Limited activity within patient's tolerance;Repositioned;Monitored during session    Home Living                      Prior Function            PT Goals (current goals can now be found in the care plan section) Progress towards PT goals: Progressing toward goals    Frequency    Min 3X/week      PT Plan Discharge  plan needs to be updated;Frequency needs to be updated    Co-evaluation              AM-PAC PT "6 Clicks" Daily Activity  Outcome Measure  Difficulty turning over in bed (including adjusting bedclothes, sheets and blankets)?: A Little Difficulty moving from lying on back to sitting on the side of the bed? : A Lot Difficulty sitting down on and standing up from a chair with arms (e.g., wheelchair, bedside commode, etc,.)?: A Lot Help needed moving to and from a bed to chair (including a wheelchair)?: A Little Help needed walking in hospital room?: A Little Help needed climbing 3-5 steps with a railing? : A Lot 6 Click Score: 15    End of Session   Activity Tolerance: Patient tolerated treatment well Patient left: in bed;with call bell/phone within reach;with family/visitor present Nurse Communication: Mobility status PT Visit Diagnosis: Difficulty in walking,  not elsewhere classified (R26.2)     Time: 5615-3794 PT Time Calculation (min) (ACUTE ONLY): 31 min  Charges:  $Gait Training: 8-22 mins                    G Codes:      Carmelia Bake, PT, DPT 03/23/2018 Pager: [phone]   York Ram E 03/23/2018, 11:32 AM

## 2018-03-26 ENCOUNTER — Telehealth: Payer: Self-pay | Admitting: Cardiovascular Disease

## 2018-03-26 MED ORDER — PROPRANOLOL HCL 10 MG PO TABS: 10.0000 mg | ORAL_TABLET | Freq: Three times a day (TID) | ORAL | 1 refills | Status: DC

## 2018-03-26 NOTE — Telephone Encounter (Signed)
New Message    Patient c/o Palpitations:  High priority if patient c/o lightheadedness, shortness of breath, or chest pain  1) How long have you had palpitations/irregular HR/ Afib? Are you having the symptoms now? No  2) Are you currently experiencing lightheadedness, SOB or CP? When patient is walking SOB  3) Do you have a history of afib (atrial fibrillation) or irregular heart rhythm? Yes  4) Have you checked your BP or HR? (document readings if available):Calling to report HR 133   5) Are you experiencing any other symptoms? No

## 2018-03-26 NOTE — Telephone Encounter (Signed)
Left message to call back  

## 2018-03-26 NOTE — Telephone Encounter (Signed)
Spoke to Hannah Khan states patient was recently discharged from the hospital with sepsis of RLE.  She states she was visiting her today and her HR was 133 resting.   She is still recovering from being in the hospital and still feels "rough'.  She states she has SOB with exertion.   BP 106/56.  No fever, a & O x 4, she is currently taking cardizem 240 daily and propanolol 10 BID.    Called and spoke to patient, she states since she came home and her medications were tweaked her HR is higher.  She states this AM upon waking up it was 112.   Some SOB with exertion, otherwise states she cannot tell.   BP and HR checked while on phone, BP 113/68 HR 126.  Patient does not sound to be in any distress.  She states she was taking propanolol 10 TID in hospital.   Appt scheduled for 7/9 at 10AM with Hannah Ferries PA.  Reviewed with DOD Dr. Sallyanne Kuster, recommend increase propanolol to 10 mg TID and keep follow up in AM.  Patient aware and verbalized understanding.  Will continue to monitor and if anything worsens will go to ER for evaluation.

## 2018-03-27 ENCOUNTER — Encounter: Payer: Self-pay | Admitting: Physician Assistant

## 2018-03-27 ENCOUNTER — Ambulatory Visit: Payer: BC Managed Care – PPO | Admitting: Physician Assistant

## 2018-03-27 VITALS — BP 92/52 | HR 121 | Ht 67.0 in | Wt 349.8 lb

## 2018-03-27 DIAGNOSIS — I10 Essential (primary) hypertension: Secondary | ICD-10-CM

## 2018-03-27 DIAGNOSIS — I482 Chronic atrial fibrillation: Secondary | ICD-10-CM | POA: Diagnosis not present

## 2018-03-27 DIAGNOSIS — I5033 Acute on chronic diastolic (congestive) heart failure: Secondary | ICD-10-CM | POA: Diagnosis not present

## 2018-03-27 DIAGNOSIS — D61818 Other pancytopenia: Secondary | ICD-10-CM | POA: Diagnosis not present

## 2018-03-27 DIAGNOSIS — I4821 Permanent atrial fibrillation: Secondary | ICD-10-CM

## 2018-03-27 MED ORDER — KLOR-CON M20 20 MEQ PO TBCR: 20.0000 meq | EXTENDED_RELEASE_TABLET | Freq: Every day | ORAL | 3 refills | Status: DC

## 2018-03-27 MED ORDER — BUMETANIDE 2 MG PO TABS: 2.0000 mg | ORAL_TABLET | Freq: Every day | ORAL | 3 refills | Status: DC

## 2018-03-27 NOTE — Progress Notes (Signed)
Cardiology Office Note   Date:  03/27/2018   ID:  Hannah Khan, DOB October 08, 1955, MRN [id number]  PCP:  Lujean Amel, MD  Cardiologist: Dr. Oval Linsey, 06/06/2017 Rosaria Ferries, PA-C   Chief Complaint  Patient presents with  . Hospitalization Follow-up    History of Present Illness: Hannah Khan is a 62 y.o. female with a history of 62 y.o. female with chronic atrial fibrillation (not anticoagulated due to thrombocytopenia, felt poor candidate for [facility]man), morbid obesity, HTN, DM2, NASH cirrhosis, ITP, portal HTN, fibromyalgia, nl EF 2017 echo  Admitted 6/26-03/23/2018 for sepsis secondary to RLE cellulitis, A. fib RVR, thrombocytopenia, d/c on Cardizem and propranolol Phone note 03/26/2018 regarding tachycardia, heart rate 133, appointment made  Hannah Khan presents for cardiology follow up  She has a nurse coming out to check on her. They are changing the dressings themselves. Her leg is getting better. Still w/ significant RLE edema, LLE is much better.  She was on Bumex PTA, now on Lasix 40 mg qd. Feels she still has extra fluid on board.   She has not weighed since d/c from the hospital, scale needs new batteries.   When her HR gets > 105, it makes her SOB. When her HR is > 100, she breathes ok.   She called 07/08 because of her elevated HR, propranolol was increased to tid.    Past Medical History:  Diagnosis Date  . Anemia    hx of  . Arthritis    knees  . Diabetes mellitus without complication ([place])    type 2  . Fibromyalgia   . HTN (hypertension)   . Hx of colonic polyps    s/p partial colectomy  . Hyperlipidemia   . Joint pain    in knees and back spasms  . Left leg swelling    wear compression hose  . Liver cirrhosis secondary to NASH (nonalcoholic steatohepatitis) ([place]) dx nov 2014   had enlarged spleen also   . Low blood pressure    bp varies  . Morbid obesity ([place])   . Persistent atrial fibrillation ([place])   . Pneumonia 10/2016  . PONV  (postoperative nausea and vomiting)    in past none recent  . Portal hypertension ([place])   . Sleep apnea    uses cpap setting of 14  . Spleen enlarged   . 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 [facility])     Past Surgical History:  Procedure Laterality Date  . BREAST LUMPECTOMY WITH RADIOACTIVE SEED LOCALIZATION Right 08/29/2017   Procedure: RIGHT BREAST LUMPECTOMY WITH RADIOACTIVE SEEDS LOCALIZATION;  Surgeon: Erroll Luna, MD;  Location: [facility];  Service: General;  Laterality: Right;  . CESAREAN SECTION  1989  . CHOLECYSTECTOMY  20 yrs ago  . COLECTOMY  2011  . COLONOSCOPY    . DILATATION & CURETTAGE/HYSTEROSCOPY WITH MYOSURE N/A 05/06/2016   Procedure: DILATATION & CURETTAGE/HYSTEROSCOPY WITH MYOSURE WITH POLYPECTOMY;  Surgeon: Janyth Pupa, DO;  Location: [facility];  Service: Gynecology;  Laterality: N/A;  . ESOPHAGOGASTRODUODENOSCOPY (EGD) WITH PROPOFOL N/A 09/04/2013   Procedure: ESOPHAGOGASTRODUODENOSCOPY (EGD) WITH PROPOFOL;  Surgeon: [name], MD;  Location: [facility];  Service: Endoscopy;  Laterality: N/A;  . KNEE ARTHROSCOPY  yrs ago   bilateral, one done x 1, one done twice    Current Outpatient Medications  Medication Sig Dispense Refill  . calcium carbonate (OS-CAL - DOSED IN MG OF ELEMENTAL CALCIUM) 1250 (500 Ca) MG tablet Take 1 tablet by mouth daily with breakfast.     .  cephALEXin (KEFLEX) 500 MG capsule Take 1 capsule (500 mg total) by mouth 4 (four) times daily for 4 days. 16 capsule 0  . cyclobenzaprine (FLEXERIL) 10 MG tablet Take 10 mg by mouth 3 (three) times daily as needed for muscle spasms.    Marland Kitchen diltiazem (CARDIZEM CD) 240 MG 24 hr capsule Take 1 capsule (240 mg total) by mouth daily. 90 capsule 2  . ergocalciferol (VITAMIN D2) 50000 units capsule Take 50,000 Units by mouth 2 (two) times a week. On Wednesday and Saturday    . furosemide (LASIX) 40 MG tablet Take 1 tablet (40 mg total) by mouth daily. 30 tablet 0  . hydrOXYzine (ATARAX/VISTARIL) 10 MG  tablet Take 1 tablet (10 mg total) by mouth 3 (three) times daily as needed for anxiety. 30 tablet 0  . JARDIANCE 25 MG TABS tablet Take 25 mg by mouth daily.  5  . KLOR-CON M20 20 MEQ tablet Take 20 mEq by mouth daily.   3  . oxyCODONE-acetaminophen (PERCOCET/ROXICET) 5-325 MG tablet Take 1 tablet by mouth every 8 (eight) hours as needed for severe pain.    Marland Kitchen propranolol (INDERAL) 10 MG tablet Take 1 tablet (10 mg total) by mouth 3 (three) times daily. 180 tablet 1  . spironolactone (ALDACTONE) 25 MG tablet Take 25 mg by mouth daily.      No current facility-administered medications for this visit.     Allergies:   Celecoxib; Shellfish allergy; Barium-containing compounds; Prednisone; Statins; Ibuprofen; and Tylenol [acetaminophen]    Social History:  The patient  reports that she quit smoking about 43 years ago. Her smoking use included cigarettes. She has a 3.00 pack-year smoking history. She has never used smokeless tobacco. She reports that she drinks alcohol. She reports that she does not use drugs.   Family History:  [name] family history includes Cancer in her brother, father, and mother; Congestive Heart Failure in her mother; Heart failure in her mother; Hyperlipidemia in her brother and mother; Lung cancer in her father; Stroke in her mother and other.    ROS:  Please see the history of present illness. All other systems are reviewed and negative.    PHYSICAL EXAM: VS:  BP (!) 92/52   Pulse (!) 121   Ht 5' 7"  (1.702 m)   Wt (!) 349 lb 12.8 oz (158.7 kg)   SpO2 96%   BMI 54.79 kg/m  , BMI Body mass index is 54.79 kg/m. GEN: Well nourished, well developed, female in no acute distress  HEENT: normal for age  Neck: no JVD seen, difficult to assess secondary to body habitus, no carotid bruit, no masses Cardiac: Irregular rate and rhythm; no murmur, no rubs, or gallops Respiratory: Decreased breath sounds bases with rales bilaterally, normal work of breathing GI: soft,  nontender, nondistended, + BS MS: no deformity or atrophy; 1+ edema left, increased edema RLE but the leg is very tender and was not palpated; distal pulses are 2+ in upper extremities, capillary refill normal in both lower extremities, palpation of pulses limited due to edema Skin: warm and dry, no rash Neuro:  Strength and sensation are intact Psych: euthymic mood, full affect   EKG:  EKG is ordered today. The ekg ordered today demonstrates atrial fibrillation, rapid ventricular response with a heart rate of 107, no acute ischemic changes  ECHO: 03/16/2018 - Left ventricle: The cavity size was normal. There was mild   concentric hypertrophy. Systolic function was normal. The   estimated ejection fraction was  in the range of 55% to 60%.   Although no diagnostic regional wall motion abnormality was   identified, this possibility cannot be completely excluded on the   basis of this study. Due to tachycardia, there was fusion of   early and atrial contributions to ventricular filling. The study   is not technically sufficient to allow evaluation of LV diastolic   function. - Right ventricle: Not able to assess RV function due to poor   visualization. The cavity size was dilated. - Right atrium: The atrium was mildly dilated. Impressions: - Apical images were poor quality even with Definity imaging,   Recent Labs: 03/16/2018: Magnesium 2.3 03/23/2018: ALT 13; BUN 15; Creatinine, Ser 0.54; Hemoglobin 11.6; Platelets 56; Potassium 3.7; Sodium 140    Lipid Panel    Component Value Date/Time   CHOL 175 07/02/2014 1138   TRIG 93 07/02/2014 1138   HDL 59 07/02/2014 1138   CHOLHDL 3.0 07/02/2014 1138   VLDL 19 07/02/2014 1138   LDLCALC 97 07/02/2014 1138     Wt Readings from Last 3 Encounters:  03/27/18 (!) 349 lb 12.8 oz (158.7 kg)  03/23/18 (!) 363 lb 6.4 oz (164.8 kg)  12/08/17 (!) 357 lb 3.2 oz (162 kg)     Other studies Reviewed: Additional studies/ records that were  reviewed today include: office notes, hospital records and testing.  ASSESSMENT AND PLAN:  1.  Acute on chronic diastolic CHF: Although her weight is down on our scales today, she still has significant volume overload. - Resume Bumex at her home dose, DC Lasix - Resume daily weights, they say they will be able to get a battery further scale - Limit sodium to 500 mg per meal, limit liquids to 2 L daily - Keep 7/31 follow-up visit to reassess volume status and rate control -Check BMET today  2.  Hypertension: -She actually has problems with hypotension now because of Cardizem CD 240 mg plus propranolol and the diuretics - I explained that her blood pressure limits increasing her Cardizem or the propranolol for better heart rate control. - Continue current therapy, reports symptomatic hypotension or systolic blood pressure less than 80.  3.  Permanent atrial fibrillation: - She has been on propranolol 10 mg twice daily for a long time, yesterday it was increased to 10 mg 3 times daily for better heart rate control - Continue the Cardizem at the current dose, her blood pressure did not tolerate an increase when that was previously tried. - Because of her history of pancytopenia, she is not an anticoagulation candidate. -Therefore, cannot pursue sinus rhythm   Current medicines are reviewed at length with the patient today.  The patient has concerns regarding medicines.  Concerns were addressed  The following changes have been made: Restart Bumex, DC Lasix  Labs/ tests ordered today include:   Orders Placed This Encounter  Procedures  . Basic metabolic panel  . EKG 12-Lead     Disposition:   FU with Dr. Oval Linsey  Signed, Rosaria Ferries, PA-C  03/27/2018 11:47 AM    [place] Phone: [phone]; Fax: [phone]  This note was written with the assistance of speech recognition software. Please excuse any transcriptional errors.

## 2018-03-27 NOTE — Patient Instructions (Addendum)
Medication Instructions: Your physician has recommended you make the following change in your medication:  STOP: Lasix   You may resume your Bumex taking 2 tablets daily and you may take and additional 2 mg as needed for Edema     Labwork: TODAY: BMET    Procedures/Testing: None Ordered   Follow-Up: Keep your follow up appointment with Dr. Rosalyn Gess on 04/18/18 at 8:00 AM   Any Additional Special Instructions Will Be Listed Below (If Applicable).  Please try to intake less than 2 liters of liquids a day   Please try and keep a daily log of your weight   Please try and keep a daily log of your blood pressures    Low-Sodium Eating Plan Sodium, which is an element that makes up salt, helps you maintain a healthy balance of fluids in your body. Too much sodium can increase your blood pressure and cause fluid and waste to be held in your body. Your health care provider or dietitian may recommend following this plan if you have high blood pressure (hypertension), kidney disease, liver disease, or heart failure. Eating less sodium can help lower your blood pressure, reduce swelling, and protect your heart, liver, and kidneys. What are tips for following this plan? General guidelines  Most people on this plan should limit their sodium intake to 1,500-2,000 mg (milligrams) of sodium each day. (500 mg per meal)  Reading food labels  The Nutrition Facts label lists the amount of sodium in one serving of the food. If you eat more than one serving, you must multiply the listed amount of sodium by the number of servings.  Choose foods with less than 140 mg of sodium per serving.  Avoid foods with 300 mg of sodium or more per serving. Shopping  Look for lower-sodium products, often labeled as "low-sodium" or "no salt added."  Always check the sodium content even if foods are labeled as "unsalted" or "no salt added".  Buy fresh foods. ? Avoid canned foods and premade or frozen  meals. ? Avoid canned, cured, or processed meats  Buy breads that have less than 80 mg of sodium per slice. Cooking  Eat more home-cooked food and less restaurant, buffet, and fast food.  Avoid adding salt when cooking. Use salt-free seasonings or herbs instead of table salt or sea salt. Check with your health care provider or pharmacist before using salt substitutes.  Cook with plant-based oils, such as canola, sunflower, or olive oil. Meal planning  When eating at a restaurant, ask that your food be prepared with less salt or no salt, if possible.  Avoid foods that contain MSG (monosodium glutamate). MSG is sometimes added to [place] food, bouillon, and some canned foods. What foods are recommended? The items listed may not be a complete list. Talk with your dietitian about what dietary choices are best for you. Grains Low-sodium cereals, including oats, puffed wheat and rice, and shredded wheat. Low-sodium crackers. Unsalted rice. Unsalted pasta. Low-sodium bread. Whole-grain breads and whole-grain pasta. Vegetables Fresh or frozen vegetables. "No salt added" canned vegetables. "No salt added" tomato sauce and paste. Low-sodium or reduced-sodium tomato and vegetable juice. Fruits Fresh, frozen, or canned fruit. Fruit juice. Meats and other protein foods Fresh or frozen (no salt added) meat, poultry, seafood, and fish. Low-sodium canned tuna and salmon. Unsalted nuts. Dried peas, beans, and lentils without added salt. Unsalted canned beans. Eggs. Unsalted nut butters. Dairy Milk. Soy milk. Cheese that is naturally low in sodium, such as ricotta cheese,  fresh mozzarella, or Swiss cheese Low-sodium or reduced-sodium cheese. Cream cheese. Yogurt. Fats and oils Unsalted butter. Unsalted margarine with no trans fat. Vegetable oils such as canola or olive oils. Seasonings and other foods Fresh and dried herbs and spices. Salt-free seasonings. Low-sodium mustard and ketchup. Sodium-free  salad dressing. Sodium-free light mayonnaise. Fresh or refrigerated horseradish. Lemon juice. Vinegar. Homemade, reduced-sodium, or low-sodium soups. Unsalted popcorn and pretzels. Low-salt or salt-free chips. What foods are not recommended? The items listed may not be a complete list. Talk with your dietitian about what dietary choices are best for you. Grains Instant hot cereals. Bread stuffing, pancake, and biscuit mixes. Croutons. Seasoned rice or pasta mixes. Noodle soup cups. Boxed or frozen macaroni and cheese. Regular salted crackers. Self-rising flour. Vegetables Sauerkraut, pickled vegetables, and relishes. Olives. [place] fries. Onion rings. Regular canned vegetables (not low-sodium or reduced-sodium). Regular canned tomato sauce and paste (not low-sodium or reduced-sodium). Regular tomato and vegetable juice (not low-sodium or reduced-sodium). Frozen vegetables in sauces. Meats and other protein foods Meat or fish that is salted, canned, smoked, spiced, or pickled. Bacon, ham, sausage, hotdogs, corned beef, chipped beef, packaged lunch meats, salt pork, jerky, pickled herring, anchovies, regular canned tuna, sardines, salted nuts. Dairy Processed cheese and cheese spreads. Cheese curds. Blue cheese. Feta cheese. String cheese. Regular cottage cheese. Buttermilk. Canned milk. Fats and oils Salted butter. Regular margarine. Ghee. Bacon fat. Seasonings and other foods Onion salt, garlic salt, seasoned salt, table salt, and sea salt. Canned and packaged gravies. Worcestershire sauce. Tartar sauce. Barbecue sauce. Teriyaki sauce. Soy sauce, including reduced-sodium. Steak sauce. Fish sauce. Oyster sauce. Cocktail sauce. Horseradish that you find on the shelf. Regular ketchup and mustard. Meat flavorings and tenderizers. Bouillon cubes. Hot sauce and Tabasco sauce. Premade or packaged marinades. Premade or packaged taco seasonings. Relishes. Regular salad dressings. Salsa. Potato and tortilla  chips. Corn chips and puffs. Salted popcorn and pretzels. Canned or dried soups. Pizza. Frozen entrees and pot pies. Summary  Eating less sodium can help lower your blood pressure, reduce swelling, and protect your heart, liver, and kidneys.  Most people on this plan should limit their sodium intake to 1,500-2,000 mg (milligrams) of sodium each day.  Canned, boxed, and frozen foods are high in sodium. Restaurant foods, fast foods, and pizza are also very high in sodium. You also get sodium by adding salt to food.  Try to cook at home, eat more fresh fruits and vegetables, and eat less fast food, canned, processed, or prepared foods. This information is not intended to replace advice given to you by your health care provider. Make sure you discuss any questions you have with your health care provider. Document Released: 02/25/2002 Document Revised: 08/29/2016 Document Reviewed: 08/29/2016 Elsevier Interactive Patient Education  [employer].     If you need a refill on your cardiac medications before your next appointment, please call your pharmacy.

## 2018-03-28 ENCOUNTER — Telehealth: Payer: Self-pay | Admitting: Physician Assistant

## 2018-03-28 LAB — BASIC METABOLIC PANEL
BUN / CREAT RATIO: 20 (ref 12–28)
BUN: 13 mg/dL (ref 8–27)
CO2: 24 mmol/L (ref 20–29)
CREATININE: 0.65 mg/dL (ref 0.57–1.00)
Calcium: 8.9 mg/dL (ref 8.7–10.3)
Chloride: 102 mmol/L (ref 96–106)
GFR, EST AFRICAN AMERICAN: 110 mL/min/{1.73_m2} (ref >59)
GFR, EST NON AFRICAN AMERICAN: 95 mL/min/{1.73_m2} (ref >59)
Glucose: 131 mg/dL — ABNORMAL HIGH (ref 65–99)
Potassium: 4.3 mmol/L (ref 3.5–5.2)
Sodium: 139 mmol/L (ref 134–144)

## 2018-03-28 NOTE — Telephone Encounter (Signed)
New Message   Patty with [facility] is calling to report a level to drug interaction

## 2018-03-28 NOTE — Telephone Encounter (Signed)
Spoke with Hannah Khan and she was wanting Dr Allene Pyo B PA to be aware of interaction with Lasix, K+, and Aldactone. Advised Hannah Khan patient was seen by Suanne Marker B yesterday Lasix d/c and labs done. Reviewed Rhonda's office note from yesterday with her. Hannah Khan would also like parameters to call for her heart rate and blood pressure. Will forward to Dr Oval Linsey and Suanne Marker B PA for review, explained they were both out of the office today.

## 2018-03-28 NOTE — Telephone Encounter (Signed)
Advised Hannah Khan, verbalized understanding

## 2018-03-28 NOTE — Telephone Encounter (Signed)
We are aware of this interaction.  Continue as prescribed.  Call for HR >120, SBP <85.

## 2018-04-07 ENCOUNTER — Other Ambulatory Visit: Payer: Self-pay | Admitting: Cardiovascular Disease

## 2018-04-09 NOTE — Telephone Encounter (Signed)
Rx sent to pharmacy   

## 2018-04-18 ENCOUNTER — Ambulatory Visit: Payer: BC Managed Care – PPO | Admitting: Cardiology

## 2018-04-18 ENCOUNTER — Encounter: Payer: Self-pay | Admitting: Cardiology

## 2018-04-18 VITALS — BP 90/70 | HR 93 | Ht 67.0 in | Wt 344.0 lb

## 2018-04-18 DIAGNOSIS — K7469 Other cirrhosis of liver: Secondary | ICD-10-CM | POA: Diagnosis not present

## 2018-04-18 DIAGNOSIS — L03115 Cellulitis of right lower limb: Secondary | ICD-10-CM

## 2018-04-18 DIAGNOSIS — I5043 Acute on chronic combined systolic (congestive) and diastolic (congestive) heart failure: Secondary | ICD-10-CM

## 2018-04-18 DIAGNOSIS — I4891 Unspecified atrial fibrillation: Secondary | ICD-10-CM

## 2018-04-18 DIAGNOSIS — I482 Chronic atrial fibrillation: Secondary | ICD-10-CM

## 2018-04-18 DIAGNOSIS — I4821 Permanent atrial fibrillation: Secondary | ICD-10-CM

## 2018-04-18 DIAGNOSIS — I5033 Acute on chronic diastolic (congestive) heart failure: Secondary | ICD-10-CM | POA: Diagnosis not present

## 2018-04-18 DIAGNOSIS — G4733 Obstructive sleep apnea (adult) (pediatric): Secondary | ICD-10-CM

## 2018-04-18 DIAGNOSIS — I10 Essential (primary) hypertension: Secondary | ICD-10-CM

## 2018-04-18 DIAGNOSIS — E1169 Type 2 diabetes mellitus with other specified complication: Secondary | ICD-10-CM

## 2018-04-18 NOTE — Patient Instructions (Addendum)
Medication Instructions:  INCREASE Bumex to twice a day THEN when weight comes down to 330lbs DECREASE Bumex to one tablet daily  Labwork: Your physician recommends that you return for lab work in: 1 week-BMET, BNP  Testing/Procedures: NONE   Follow-Up: Your physician recommends that you schedule a follow-up appointment in: [place]  Any Other Special Instructions Will Be Listed Below (If Applicable). If you need a refill on your cardiac medications before your next appointment, please call your pharmacy.

## 2018-04-18 NOTE — Assessment & Plan Note (Signed)
She is on Aldactone in addition to Bumex

## 2018-04-18 NOTE — Assessment & Plan Note (Signed)
She is still 10-15 lbs above her "good weight" according to the patient, it's difficult to tell on exam secondary to morbid obesity

## 2018-04-18 NOTE — Assessment & Plan Note (Addendum)
VR controlled. Not a candidate for anticoagulation.

## 2018-04-18 NOTE — Assessment & Plan Note (Signed)
Admitted June 2019 for this

## 2018-04-18 NOTE — Progress Notes (Signed)
04/18/2018 Hannah Khan   [birth year]  [id number]  Primary Physician Lujean Amel, MD Primary Cardiologist: Dr Oval Linsey  HPI:  62 y/o morbidly obese Caucasian female with a history of CAF, not anticoagulated due to thrombocytopenia and felt to be a poor candidate for [facility], HTN, DM2, NASH cirrhosis, ITP, portal HTN, and fibromyalgia.  She was admitted to the hospital 6/26-03/23/18 for sepsis secondary to RLE cellulitis. She had AF with RVR and diastolic CHF. She was diuresed in the hospital on IV Lasix.  She was seen in the office 03/27/18 and her diuretics were adjusted, she was placed back on her prior dose of Bumex (2 mg daily). Her weight then was 349 lbs.. She returns to day for follow up. She has not made much progress as far as her weight goes, her weight today is 344 lbs. She feels her "good weight" is closer to 330 lbs.     Current Outpatient Medications  Medication Sig Dispense Refill  . bumetanide (BUMEX) 2 MG tablet Take 1 tablet (2 mg total) by mouth daily. 90 tablet 3  . calcium carbonate (OS-CAL - DOSED IN MG OF ELEMENTAL CALCIUM) 1250 (500 Ca) MG tablet Take 1 tablet by mouth daily with breakfast.     . cyclobenzaprine (FLEXERIL) 10 MG tablet Take 10 mg by mouth 3 (three) times daily as needed for muscle spasms.    Marland Kitchen diltiazem (CARDIZEM CD) 240 MG 24 hr capsule Take 1 capsule (240 mg total) by mouth daily. Keep OV 90 capsule 0  . ergocalciferol (VITAMIN D2) 50000 units capsule Take 50,000 Units by mouth 2 (two) times a week. On Wednesday and Saturday    . JARDIANCE 25 MG TABS tablet Take 25 mg by mouth daily.  5  . KLOR-CON M20 20 MEQ tablet Take 1 tablet (20 mEq total) by mouth daily. 90 tablet 3  . oxyCODONE-acetaminophen (PERCOCET/ROXICET) 5-325 MG tablet Take 1 tablet by mouth every 8 (eight) hours as needed for severe pain.    Marland Kitchen propranolol (INDERAL) 10 MG tablet Take 1 tablet (10 mg total) by mouth 3 (three) times daily. 180 tablet 1  . spironolactone (ALDACTONE) 25  MG tablet Take 25 mg by mouth daily.      No current facility-administered medications for this visit.     Allergies  Allergen Reactions  . Celecoxib Other (See Comments)    "speech slurred" with celebrex  . Shellfish Allergy Swelling and Rash    TINGLING AND SWELLING OF LIPS. LARGE WHELPS QUARTER-SIZE  . Barium-Containing Compounds Other (See Comments)    TACHYCARDIA  . Prednisone Other (See Comments)    TACHYCARDIA  . Statins Other (See Comments)    AGGRAVATED FIBROMYALGIA  . Ibuprofen Other (See Comments)    UNSPECIFIED SPECIFIC REACTION >> "DUE TO PLATELETS"  . Tylenol [Acetaminophen] Other (See Comments)    UNSPECIFIED SPECIFIC REACTION >> "DUE TO PLATELETS"    Past Medical History:  Diagnosis Date  . Anemia    hx of  . Arthritis    knees  . Diabetes mellitus without complication ([place])    type 2  . Fibromyalgia   . HTN (hypertension)   . Hx of colonic polyps    s/p partial colectomy  . Hyperlipidemia   . Joint pain    in knees and back spasms  . Left leg swelling    wear compression hose  . Liver cirrhosis secondary to NASH (nonalcoholic steatohepatitis) ([place]) dx nov 2014   had enlarged spleen also   .  Low blood pressure    bp varies  . Morbid obesity ([place])   . Persistent atrial fibrillation ([place])   . Pneumonia 10/2016  . PONV (postoperative nausea and vomiting)    in past none recent  . Portal hypertension ([place])   . Sleep apnea    uses cpap setting of 14  . Spleen enlarged   . 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 Marital status: Married    Spouse name: Not on file  . Number of children: Not on file  . Years of education: Not on file  . Highest education level: Not on file  Occupational History  . Occupation: Merchant navy officer  Social Needs  . Financial resource strain: Not on file  . Food insecurity:    Worry: Not on file    Inability: Not on file  . Transportation needs:    Medical:  Not on file    Non-medical: Not on file  Tobacco Use  . Smoking status: Former Smoker    Packs/day: 1.00    Years: 3.00    Pack years: 3.00    Types: Cigarettes    Last attempt to quit: 09/19/1974    Years since quitting: 43.6  . Smokeless tobacco: Never Used  Substance and Sexual Activity  . Alcohol use: Yes    Comment: occ  . Drug use: No  . Sexual activity: Not on file  Lifestyle  . Physical activity:    Days per week: Not on file    Minutes per session: Not on file  . Stress: Not on file  Relationships  . Social connections:    Talks on phone: Not on file    Gets together: Not on file    Attends religious service: Not on file    Active member of club or organization: Not on file    Attends meetings of clubs or organizations: Not on file    Relationship status: Not on file  . Intimate partner violence:    Fear of current or ex partner: Not on file    Emotionally abused: Not on file    Physically abused: Not on file    Forced sexual activity: Not on file  Other Topics Concern  . Not on file  Social History Narrative   Lives in [place] with her husband.  Instructor for early childhood development.       Family History  Problem Relation Age of Onset  . Heart failure Mother   . Cancer Mother   . Hyperlipidemia Mother   . Congestive Heart Failure Mother   . Stroke Mother   . Lung cancer Father   . Cancer Father   . Cancer Brother   . Hyperlipidemia Brother   . Stroke Other      Review of Systems: General: negative for chills, fever, night sweats or weight changes.  Cardiovascular: negative for chest pain, orthopnea, palpitations, paroxysmal nocturnal dyspnea  Dermatological: negative for rash Respiratory: negative for cough or wheezing Urologic: negative for hematuria Abdominal: negative for nausea, vomiting, diarrhea, bright red blood per rectum, melena, or hematemesis Neurologic: negative for visual changes, syncope, or dizziness All other systems  reviewed and are otherwise negative except as noted above.    Blood pressure 90/70, pulse 93, height 5' 7"  (1.702 m), weight (!) 344 lb (156 kg).  General appearance: alert, cooperative, no distress and morbidly obese Lungs: clear to auscultation bilaterally Heart: irregularly irregular rhythm Extremities: 2+  RLE edema, 1+ LLE edema Skin: pale, dry Neurologic: Grossly normal  EKG AF with VR 93  ASSESSMENT AND PLAN:   Acute on chronic combined systolic and diastolic CHF (congestive heart failure) (HCC) She is still 10-15 lbs above her "good weight" according to the patient, it's difficult to tell on exam secondary to morbid obesity  Atrial fibrillation with rapid ventricular response ([place]) VR controlled. Not a candidate for anticoagulation.  Morbid obesity- BMI 53  Cirrhosis of liver, with portal HTN & Acites, by CT Abdomen 07/20/13 She is on Aldactone in addition to Bumex  Cellulitis of right lower extremity Admitted June 2019 for this   PLAN  Increase Bumex to 2 mg BID until her wgt gets to 330 lbs, then decrease to 2 mg daily. If she doesn't respond to this adjustment she may need admission for IV diuretics and CHF consult and possible Rt heart cath. F/U BMP and BNP in 1 week, f/u dr Oval Linsey 2 weeks.   Kerin Ransom PA-C 04/18/2018 8:44 AM

## 2018-04-18 NOTE — Assessment & Plan Note (Signed)
BMI 53

## 2018-04-19 MED ORDER — BUMETANIDE 2 MG PO TABS: ORAL_TABLET | ORAL | 1 refills | Status: DC

## 2018-04-19 NOTE — Addendum Note (Signed)
Addended by: Ulice Brilliant T on: 04/19/2018 12:27 PM   Modules accepted: Orders

## 2018-04-25 LAB — BASIC METABOLIC PANEL
BUN/Creatinine Ratio: 18 (ref 12–28)
BUN: 14 mg/dL (ref 8–27)
CO2: 22 mmol/L (ref 20–29)
Calcium: 9.3 mg/dL (ref 8.7–10.3)
Chloride: 104 mmol/L (ref 96–106)
Creatinine, Ser: 0.77 mg/dL (ref 0.57–1.00)
GFR calc Af Amer: 96 mL/min/{1.73_m2} (ref >59)
GFR calc non Af Amer: 83 mL/min/{1.73_m2} (ref >59)
Glucose: 165 mg/dL — ABNORMAL HIGH (ref 65–99)
Potassium: 3.9 mmol/L (ref 3.5–5.2)
Sodium: 139 mmol/L (ref 134–144)

## 2018-04-25 LAB — PRO B NATRIURETIC PEPTIDE: NT-Pro BNP: 305 pg/mL — ABNORMAL HIGH (ref 0–287)

## 2018-04-26 ENCOUNTER — Inpatient Hospital Stay (HOSPITAL_COMMUNITY)
Admission: EM | Admit: 2018-04-26 | Discharge: 2018-04-29 | DRG: 603 | Disposition: A | Discharge status: Home / Self Care | Payer: BC Managed Care – PPO | Attending: Family Medicine | Admitting: Family Medicine

## 2018-04-26 ENCOUNTER — Emergency Department (HOSPITAL_COMMUNITY): Payer: BC Managed Care – PPO

## 2018-04-26 ENCOUNTER — Other Ambulatory Visit: Payer: Self-pay

## 2018-04-26 ENCOUNTER — Encounter (HOSPITAL_COMMUNITY): Payer: Self-pay

## 2018-04-26 DIAGNOSIS — M797 Fibromyalgia: Secondary | ICD-10-CM | POA: Diagnosis present

## 2018-04-26 DIAGNOSIS — I10 Essential (primary) hypertension: Secondary | ICD-10-CM | POA: Diagnosis not present

## 2018-04-26 DIAGNOSIS — E785 Hyperlipidemia, unspecified: Secondary | ICD-10-CM | POA: Diagnosis present

## 2018-04-26 DIAGNOSIS — Z87891 Personal history of nicotine dependence: Secondary | ICD-10-CM

## 2018-04-26 DIAGNOSIS — Z79891 Long term (current) use of opiate analgesic: Secondary | ICD-10-CM | POA: Diagnosis not present

## 2018-04-26 DIAGNOSIS — I5032 Chronic diastolic (congestive) heart failure: Secondary | ICD-10-CM | POA: Diagnosis present

## 2018-04-26 DIAGNOSIS — E119 Type 2 diabetes mellitus without complications: Secondary | ICD-10-CM

## 2018-04-26 DIAGNOSIS — K7460 Unspecified cirrhosis of liver: Secondary | ICD-10-CM | POA: Diagnosis present

## 2018-04-26 DIAGNOSIS — D696 Thrombocytopenia, unspecified: Secondary | ICD-10-CM | POA: Diagnosis present

## 2018-04-26 DIAGNOSIS — G4733 Obstructive sleep apnea (adult) (pediatric): Secondary | ICD-10-CM | POA: Diagnosis present

## 2018-04-26 DIAGNOSIS — L03115 Cellulitis of right lower limb: Secondary | ICD-10-CM | POA: Diagnosis present

## 2018-04-26 DIAGNOSIS — D731 Hypersplenism: Secondary | ICD-10-CM | POA: Diagnosis present

## 2018-04-26 DIAGNOSIS — R81 Glycosuria: Secondary | ICD-10-CM | POA: Diagnosis present

## 2018-04-26 DIAGNOSIS — K7581 Nonalcoholic steatohepatitis (NASH): Secondary | ICD-10-CM | POA: Diagnosis present

## 2018-04-26 DIAGNOSIS — R21 Rash and other nonspecific skin eruption: Secondary | ICD-10-CM | POA: Diagnosis present

## 2018-04-26 DIAGNOSIS — I4891 Unspecified atrial fibrillation: Secondary | ICD-10-CM | POA: Diagnosis present

## 2018-04-26 DIAGNOSIS — N390 Urinary tract infection, site not specified: Secondary | ICD-10-CM | POA: Diagnosis present

## 2018-04-26 DIAGNOSIS — Z6841 Body Mass Index (BMI) 40.0 and over, adult: Secondary | ICD-10-CM

## 2018-04-26 DIAGNOSIS — I110 Hypertensive heart disease with heart failure: Secondary | ICD-10-CM | POA: Diagnosis present

## 2018-04-26 DIAGNOSIS — I5033 Acute on chronic diastolic (congestive) heart failure: Secondary | ICD-10-CM | POA: Diagnosis present

## 2018-04-26 DIAGNOSIS — Z888 Allergy status to other drugs, medicaments and biological substances status: Secondary | ICD-10-CM

## 2018-04-26 DIAGNOSIS — Z91013 Allergy to seafood: Secondary | ICD-10-CM

## 2018-04-26 DIAGNOSIS — R823 Hemoglobinuria: Secondary | ICD-10-CM | POA: Diagnosis present

## 2018-04-26 DIAGNOSIS — Z86010 Personal history of colonic polyps: Secondary | ICD-10-CM | POA: Diagnosis not present

## 2018-04-26 DIAGNOSIS — Z79899 Other long term (current) drug therapy: Secondary | ICD-10-CM | POA: Diagnosis not present

## 2018-04-26 DIAGNOSIS — I481 Persistent atrial fibrillation: Secondary | ICD-10-CM | POA: Diagnosis present

## 2018-04-26 DIAGNOSIS — D6959 Other secondary thrombocytopenia: Secondary | ICD-10-CM | POA: Diagnosis present

## 2018-04-26 LAB — URINALYSIS, ROUTINE W REFLEX MICROSCOPIC
Bilirubin Urine: NEGATIVE
Glucose, UA: >=500 mg/dL — AB
Ketones, ur: 5 mg/dL — AB
Leukocytes, UA: NEGATIVE
Nitrite: POSITIVE — AB
PH: 6 (ref 5.0–8.0)
Protein, ur: NEGATIVE mg/dL
SPECIFIC GRAVITY, URINE: 1.028 (ref 1.005–1.030)

## 2018-04-26 LAB — CBC WITH DIFFERENTIAL/PLATELET
BASOS ABS: 0 10*3/uL (ref 0.0–0.1)
Basophils Relative: 0 %
EOS PCT: 0 %
Eosinophils Absolute: 0 10*3/uL (ref 0.0–0.7)
HEMATOCRIT: 39.4 % (ref 36.0–46.0)
Hemoglobin: 13.3 g/dL (ref 12.0–15.0)
LYMPHS ABS: 0.5 10*3/uL — AB (ref 0.7–4.0)
Lymphocytes Relative: 6 %
MCH: 31.1 pg (ref 26.0–34.0)
MCHC: 33.8 g/dL (ref 30.0–36.0)
MCV: 92.1 fL (ref 78.0–100.0)
MONO ABS: 0.4 10*3/uL (ref 0.1–1.0)
Monocytes Relative: 5 %
Neutro Abs: 7.6 10*3/uL (ref 1.7–7.7)
Neutrophils Relative %: 89 %
Platelets: 27 10*3/uL — CL (ref 150–400)
RBC: 4.28 MIL/uL (ref 3.87–5.11)
RDW: 16.6 % — AB (ref 11.5–15.5)
WBC: 8.6 10*3/uL (ref 4.0–10.5)

## 2018-04-26 LAB — COMPREHENSIVE METABOLIC PANEL
ALT: 24 U/L (ref 0–44)
AST: 42 U/L — AB (ref 15–41)
Albumin: 3.5 g/dL (ref 3.5–5.0)
Alkaline Phosphatase: 102 U/L (ref 38–126)
Anion gap: 10 (ref 5–15)
BUN: 15 mg/dL (ref 8–23)
CHLORIDE: 105 mmol/L (ref 98–111)
CO2: 24 mmol/L (ref 22–32)
Calcium: 9.1 mg/dL (ref 8.9–10.3)
Creatinine, Ser: 0.73 mg/dL (ref 0.44–1.00)
Glucose, Bld: 160 mg/dL — ABNORMAL HIGH (ref 70–99)
POTASSIUM: 4.3 mmol/L (ref 3.5–5.1)
Sodium: 139 mmol/L (ref 135–145)
Total Bilirubin: 4.7 mg/dL — ABNORMAL HIGH (ref 0.3–1.2)
Total Protein: 7.6 g/dL (ref 6.5–8.1)

## 2018-04-26 LAB — BRAIN NATRIURETIC PEPTIDE: B Natriuretic Peptide: 273.8 pg/mL — ABNORMAL HIGH (ref 0.0–100.0)

## 2018-04-26 LAB — CBG MONITORING, ED: Glucose-Capillary: 136 mg/dL — ABNORMAL HIGH (ref 70–99)

## 2018-04-26 LAB — MAGNESIUM: MAGNESIUM: 1.9 mg/dL (ref 1.7–2.4)

## 2018-04-26 LAB — LACTIC ACID, PLASMA
Lactic Acid, Venous: 1.7 mmol/L (ref 0.5–1.9)
Lactic Acid, Venous: 1.2 mmol/L (ref 0.5–1.9)

## 2018-04-26 LAB — I-STAT CG4 LACTIC ACID, ED
LACTIC ACID, VENOUS: 1.6 mmol/L (ref 0.5–1.9)
LACTIC ACID, VENOUS: 1.95 mmol/L — AB (ref 0.5–1.9)

## 2018-04-26 LAB — GLUCOSE, CAPILLARY: GLUCOSE-CAPILLARY: 152 mg/dL — AB (ref 70–99)

## 2018-04-26 LAB — PHOSPHORUS: Phosphorus: 2.8 mg/dL (ref 2.5–4.6)

## 2018-04-26 LAB — TROPONIN I

## 2018-04-26 MED ORDER — OXYCODONE-ACETAMINOPHEN 5-325 MG PO TABS
1.0000 | ORAL_TABLET | Freq: Three times a day (TID) | ORAL | Status: DC | PRN
  Administered 2018-04-26 – 2018-04-29 (×3): 1 via ORAL
  Filled 2018-04-26 (×3): qty 1

## 2018-04-26 MED ORDER — VANCOMYCIN HCL 10 G IV SOLR
2000.0000 mg | INTRAVENOUS | Status: DC
  Administered 2018-04-27: 2000 mg via INTRAVENOUS
  Filled 2018-04-26: qty 2000

## 2018-04-26 MED ORDER — PIPERACILLIN-TAZOBACTAM 3.375 G IVPB
3.3750 g | Freq: Three times a day (TID) | INTRAVENOUS | Status: DC
  Administered 2018-04-26 – 2018-04-27 (×2): 3.375 g via INTRAVENOUS
  Filled 2018-04-26 (×2): qty 50

## 2018-04-26 MED ORDER — DILTIAZEM HCL-DEXTROSE 100-5 MG/100ML-% IV SOLN (PREMIX)
5.0000 mg/h | INTRAVENOUS | Status: DC
  Administered 2018-04-26: 5 mg/h via INTRAVENOUS
  Administered 2018-04-26 – 2018-04-28 (×5): 15 mg/h via INTRAVENOUS
  Filled 2018-04-26 (×7): qty 100

## 2018-04-26 MED ORDER — POTASSIUM CHLORIDE CRYS ER 20 MEQ PO TBCR: 20.0000 meq | EXTENDED_RELEASE_TABLET | Freq: Every day | ORAL | Status: DC | PRN

## 2018-04-26 MED ORDER — PROPRANOLOL HCL 10 MG PO TABS
10.0000 mg | ORAL_TABLET | Freq: Three times a day (TID) | ORAL | Status: DC
  Administered 2018-04-26 – 2018-04-29 (×8): 10 mg via ORAL
  Filled 2018-04-26 (×10): qty 1

## 2018-04-26 MED ORDER — INSULIN ASPART 100 UNIT/ML ~~LOC~~ SOLN
0.0000 [IU] | Freq: Three times a day (TID) | SUBCUTANEOUS | Status: DC
  Administered 2018-04-26 – 2018-04-27 (×2): 1 [IU] via SUBCUTANEOUS
  Administered 2018-04-28: 2 [IU] via SUBCUTANEOUS
  Filled 2018-04-26: qty 1

## 2018-04-26 MED ORDER — CYCLOBENZAPRINE HCL 10 MG PO TABS: 10.0000 mg | ORAL_TABLET | Freq: Three times a day (TID) | ORAL | Status: DC | PRN

## 2018-04-26 MED ORDER — METOPROLOL TARTRATE 5 MG/5ML IV SOLN
5.0000 mg | Freq: Once | INTRAVENOUS | Status: AC
  Administered 2018-04-26: 5 mg via INTRAVENOUS
  Filled 2018-04-26: qty 5

## 2018-04-26 MED ORDER — SODIUM CHLORIDE 0.9 % IV BOLUS
500.0000 mL | Freq: Once | INTRAVENOUS | Status: AC
  Administered 2018-04-26: 500 mL via INTRAVENOUS

## 2018-04-26 MED ORDER — PIPERACILLIN-TAZOBACTAM 3.375 G IVPB 30 MIN
3.3750 g | Freq: Once | INTRAVENOUS | Status: AC
  Administered 2018-04-26: 3.375 g via INTRAVENOUS
  Filled 2018-04-26: qty 50

## 2018-04-26 MED ORDER — BUMETANIDE 1 MG PO TABS
2.0000 mg | ORAL_TABLET | Freq: Every day | ORAL | Status: DC | PRN
  Filled 2018-04-26: qty 4

## 2018-04-26 MED ORDER — ONDANSETRON HCL 4 MG PO TABS
4.0000 mg | ORAL_TABLET | Freq: Four times a day (QID) | ORAL | Status: DC | PRN
Start: 2018-04-26 — End: 2018-04-29

## 2018-04-26 MED ORDER — DILTIAZEM LOAD VIA INFUSION
10.0000 mg | Freq: Once | INTRAVENOUS | Status: DC
  Filled 2018-04-26: qty 10

## 2018-04-26 MED ORDER — VANCOMYCIN HCL IN DEXTROSE 1-5 GM/200ML-% IV SOLN
1000.0000 mg | Freq: Once | INTRAVENOUS | Status: AC
  Administered 2018-04-26: 1000 mg via INTRAVENOUS
  Filled 2018-04-26: qty 200

## 2018-04-26 MED ORDER — MAGNESIUM SULFATE 2 GM/50ML IV SOLN
2.0000 g | Freq: Once | INTRAVENOUS | Status: AC
  Administered 2018-04-26: 2 g via INTRAVENOUS
  Filled 2018-04-26: qty 50

## 2018-04-26 MED ORDER — ACETAMINOPHEN 325 MG PO TABS
325.0000 mg | ORAL_TABLET | Freq: Once | ORAL | Status: AC
  Administered 2018-04-26: 325 mg via ORAL
  Filled 2018-04-26: qty 1

## 2018-04-26 MED ORDER — DIPHENHYDRAMINE HCL 25 MG PO CAPS: 50.0000 mg | ORAL_CAPSULE | Freq: Every evening | ORAL | Status: DC | PRN

## 2018-04-26 MED ORDER — SPIRONOLACTONE 25 MG PO TABS
25.0000 mg | ORAL_TABLET | Freq: Every day | ORAL | Status: DC
Start: 2018-04-27 — End: 2018-04-29
  Administered 2018-04-27 – 2018-04-29 (×3): 25 mg via ORAL
  Filled 2018-04-26 (×3): qty 1

## 2018-04-26 MED ORDER — LACTATED RINGERS IV BOLUS: 1000.0000 mL | Freq: Once | INTRAVENOUS | Status: DC

## 2018-04-26 MED ORDER — ONDANSETRON HCL 4 MG/2ML IJ SOLN: 4.0000 mg | Freq: Four times a day (QID) | INTRAMUSCULAR | Status: DC | PRN

## 2018-04-26 MED ORDER — CANAGLIFLOZIN 100 MG PO TABS
100.0000 mg | ORAL_TABLET | Freq: Every day | ORAL | Status: DC
  Administered 2018-04-27: 100 mg via ORAL
  Filled 2018-04-26: qty 1

## 2018-04-26 NOTE — H&P (Addendum)
History and Physical    Hannah Khan EHO:[id number] DOB: July 01, 1956 DOA: 04/26/2018  PCP: Lujean Amel, MD   Patient coming from: Home.  I have personally briefly reviewed patient's old medical records in [place]  Chief Complaint: Nausea, RLE redness and tenderness.  HPI: Hannah Khan is a 62 y.o. female with medical history significant of anemia, osteoarthritis of the knees, type 2 diabetes, fibromyalgia, hypertension, history of colon polyps, hyperlipidemia, L LE edema, liver cirrhosis due to Karlene Lineman, morbid obesity, persistent atrial fibrillation, history of pneumonia, sleep apnea on CPAP, thrombocytopenia secondary to sequestration is coming to the emergency department with complaints of redness, swelling, warmth and tenderness to touch of right lower extremities.  She was admitted and discharged recently due to right lower extremity cellulitis.  She and her husband were concerned, because the last time they waited too long to come to the emergency department.  She complains of fever, chills, fatigue and malaise.  No sore throat, chest pain, dizziness, palpitations, diaphoresis or pitting edema lower extremities.  The patient has been nauseated at times, but no abdominal pain, diarrhea, constipation, melena or hematochezia.  Denies dysuria, frequency or hematuria.  No polyuria, polydipsia, polyphagia or blurred vision.  Denies heat or cold intolerance  ED Course: Initial vital signs temperature 98.9 F, but subsequently the patient spiked a temperature of 102.6 F, pulse 147, respirations 22 and blood pressure 100/50 mmHg.  Her O2 sat was 98% on room air.  She was given NS 500 mL bolus, Zosyn and vancomycin.  Her white count is 8.6 with 89% neutrophils, 6% lymphocytes and 5% monocytes.  Hemoglobin 13.3 g/dL and platelets 27.  Her urinalysis shows glucosuria more than 500 mg/dL, moderate hemoglobinuria, 5 mg of ketones, positive nitrates, negative leukocyte esterase and many bacteria.   CMP shows a glucose of 160, total bilirubin of 4.7 mg/dL.  Her AST is slightly elevated 42 units/L.  All other values were normal.  Troponin was normal.  BNP was 273.8 pg/mL.  Her chest radiograph shows cardiomegaly with pulmonary vascular congestion.  Review of Systems: As per HPI otherwise 10 point review of systems negative.   Past Medical History:  Diagnosis Date  . Anemia    hx of  . Arthritis    knees  . Diabetes mellitus without complication ([place])    type 2  . Fibromyalgia   . HTN (hypertension)   . Hx of colonic polyps    s/p partial colectomy  . Hyperlipidemia   . Joint pain    in knees and back spasms  . Left leg swelling    wear compression hose  . Liver cirrhosis secondary to NASH (nonalcoholic steatohepatitis) ([place]) dx nov 2014   had enlarged spleen also   . Low blood pressure    bp varies  . Morbid obesity ([place])   . Persistent atrial fibrillation ([place])   . Pneumonia 10/2016  . PONV (postoperative nausea and vomiting)    in past none recent  . Portal hypertension ([place])   . Sleep apnea    uses cpap setting of 14  . Spleen enlarged   . 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 BREAST LUMPECTOMY WITH RADIOACTIVE SEED LOCALIZATION Right 08/29/2017   Procedure: RIGHT BREAST LUMPECTOMY WITH RADIOACTIVE SEEDS LOCALIZATION;  Surgeon: Erroll Luna, MD;  Location: [facility];  Service: General;  Laterality: Right;  . CESAREAN SECTION  1989  . CHOLECYSTECTOMY  20 yrs ago  .  COLECTOMY  2011  . COLONOSCOPY    . DILATATION & CURETTAGE/HYSTEROSCOPY WITH MYOSURE N/A 05/06/2016   Procedure: DILATATION & CURETTAGE/HYSTEROSCOPY WITH MYOSURE WITH POLYPECTOMY;  Surgeon: Janyth Pupa, DO;  Location: [facility];  Service: Gynecology;  Laterality: N/A;  . ESOPHAGOGASTRODUODENOSCOPY (EGD) WITH PROPOFOL N/A 09/04/2013   Procedure: ESOPHAGOGASTRODUODENOSCOPY (EGD) WITH PROPOFOL;  Surgeon: [name], MD;  Location: [facility]  ENDOSCOPY;  Service: Endoscopy;  Laterality: N/A;  . KNEE ARTHROSCOPY  yrs ago   bilateral, one done x 1, one done twice     reports that she quit smoking about 43 years ago. Her smoking use included cigarettes. She has a 3.00 pack-year smoking history. She has never used smokeless tobacco. She reports that she drinks alcohol. She reports that she does not use drugs.  Allergies  Allergen Reactions  . Celecoxib Other (See Comments)    "speech slurred" with celebrex  . Shellfish Allergy Swelling and Rash    TINGLING AND SWELLING OF LIPS. LARGE WHELPS QUARTER-SIZE  . Barium-Containing Compounds Other (See Comments)    TACHYCARDIA  . Prednisone Other (See Comments)    TACHYCARDIA  . Statins Other (See Comments)    AGGRAVATED FIBROMYALGIA  . Fentanyl     Hallucinations   . Ibuprofen Other (See Comments)    UNSPECIFIED SPECIFIC REACTION >> "DUE TO PLATELETS"  . Tylenol [Acetaminophen] Other (See Comments)    UNSPECIFIED SPECIFIC REACTION >> "DUE TO PLATELETS"    Family History  Problem Relation Age of Onset  . Heart failure Mother   . Cancer Mother   . Hyperlipidemia Mother   . Congestive Heart Failure Mother   . Stroke Mother   . Lung cancer Father   . Cancer Father   . Cancer Brother   . Hyperlipidemia Brother   . Stroke Other    Prior to Admission medications   Medication Sig Start Date End Date Taking? Authorizing Provider  bumetanide (BUMEX) 2 MG tablet Take as directed Patient taking differently: Take 2-4 mg by mouth See admin instructions. May take up to 4 mg as needed for fluid 04/19/18  Yes Kilroy, Luke K, PA-C  calcium carbonate (OS-CAL - DOSED IN MG OF ELEMENTAL CALCIUM) 1250 (500 Ca) MG tablet Take 1 tablet by mouth daily with breakfast.    Yes [provider]  cyclobenzaprine (FLEXERIL) 10 MG tablet Take 10 mg by mouth 3 (three) times daily as needed for muscle spasms.   Yes [provider]  diltiazem (CARDIZEM CD) 240 MG 24 hr capsule Take 1  capsule (240 mg total) by mouth daily. Keep OV 04/09/18  Yes Skeet Latch, MD  diphenhydrAMINE (BENADRYL) 25 MG tablet Take 50 mg by mouth at bedtime as needed for sleep.   Yes [provider]  ergocalciferol (VITAMIN D2) 50000 units capsule Take 50,000 Units by mouth 2 (two) times a week. On Wednesday and Saturday   Yes [provider]  JARDIANCE 25 MG TABS tablet Take 25 mg by mouth daily after breakfast.  01/28/17  Yes [provider]  KLOR-CON M20 20 MEQ tablet Take 1 tablet (20 mEq total) by mouth daily. Patient taking differently: Take 20 mEq by mouth daily as needed (taken with fluid pill).  03/27/18  Yes Barrett, Evelene Croon, PA-C  oxyCODONE-acetaminophen (PERCOCET/ROXICET) 5-325 MG tablet Take 1 tablet by mouth every 8 (eight) hours as needed for severe pain.   Yes [provider]  propranolol (INDERAL) 10 MG tablet Take 1 tablet (10 mg total) by mouth 3 (three) times  daily. 03/26/18  Yes Croitoru, Mihai, MD  spironolactone (ALDACTONE) 25 MG tablet Take 25 mg by mouth daily after breakfast.  07/24/13  Yes Thurnell Lose, MD    Physical Exam: Vitals:   04/26/18 1445 04/26/18 1500 04/26/18 1515 04/26/18 1530  BP: 124/67 112/62 133/73 124/81  Pulse: (!) 132 (!) 134 (!) 143 (!) 134  Resp: 17 16  18   Temp:      TempSrc:      SpO2: 99% 99% 99% 98%  Weight:      Height:        Constitutional: Looks acutely ill. Eyes: PERRL, lids and conjunctivae normal ENMT: Mucous membranes and lips are very dry.  Posterior pharynx clear of any exudate or lesions. Neck: normal, supple, no masses, no thyromegaly Respiratory: Shallow inspiration, but otherwise clear to auscultation bilaterally, no wheezing, no crackles. Normal respiratory effort. No accessory muscle use.  Cardiovascular: Tachycardic in the 100s and 110s, no murmurs / rubs / gallops. No extremity edema. 2+ pedal pulses. No carotid bruits.  Abdomen: Obese, soft, no tenderness, no masses palpated. No  hepatosplenomegaly. Bowel sounds positive.  Musculoskeletal: no clubbing / cyanosis. Good ROM, no contractures. Normal muscle tone.  Skin: Positive erythema, edema, calor and tenderness to palpation on the right lower extremity pretibial area extending posteriorly to the calf and upwards to the knee area. Neurologic: CN 2-12 grossly intact. Sensation intact, DTR normal. Strength 5/5 in all 4.  Psychiatric: Normal judgment and insight. Alert and oriented x 3.  Mildly anxious mood.       Labs on Admission: I have personally reviewed following labs and imaging studies  CBC: Recent Labs  Lab 04/26/18 1216  WBC 8.6  NEUTROABS 7.6  HGB 13.3  HCT 39.4  MCV 92.1  PLT 27*   Basic Metabolic Panel: Recent Labs  Lab 04/25/18 0815 04/26/18 1216  NA 139 139  K 3.9 4.3  CL 104 105  CO2 22 24  GLUCOSE 165* 160*  BUN 14 15  CREATININE 0.77 0.73  CALCIUM 9.3 9.1   GFR: Estimated Creatinine Clearance: 115.7 mL/min (by C-G formula based on SCr of 0.73 mg/dL). Liver Function Tests: Recent Labs  Lab 04/26/18 1216  AST 42*  ALT 24  ALKPHOS 102  BILITOT 4.7*  PROT 7.6  ALBUMIN 3.5   No results for input(s): LIPASE, AMYLASE in the last 168 hours. No results for input(s): AMMONIA in the last 168 hours. Coagulation Profile: No results for input(s): INR, PROTIME in the last 168 hours. Cardiac Enzymes: No results for input(s): CKTOTAL, CKMB, CKMBINDEX, TROPONINI in the last 168 hours. BNP (last 3 results) Recent Labs    04/25/18 0815  PROBNP 305*   HbA1C: No results for input(s): HGBA1C in the last 72 hours. CBG: No results for input(s): GLUCAP in the last 168 hours. Lipid Profile: No results for input(s): CHOL, HDL, LDLCALC, TRIG, CHOLHDL, LDLDIRECT in the last 72 hours. Thyroid Function Tests: No results for input(s): TSH, T4TOTAL, FREET4, T3FREE, THYROIDAB in the last 72 hours. Anemia Panel: No results for input(s): VITAMINB12, FOLATE, FERRITIN, TIBC, IRON, RETICCTPCT in  the last 72 hours. Urine analysis:    Component Value Date/Time   COLORURINE YELLOW 04/26/2018 [place] 04/26/2018 1520   LABSPEC 1.028 04/26/2018 1520   PHURINE 6.0 04/26/2018 1520   GLUCOSEU >=500 (A) 04/26/2018 1520   HGBUR MODERATE (A) 04/26/2018 1520   BILIRUBINUR NEGATIVE 04/26/2018 1520   KETONESUR 5 (A) 04/26/2018 1520   PROTEINUR NEGATIVE 04/26/2018  1520   UROBILINOGEN 2.0 (H) 01/31/2015 0055   NITRITE POSITIVE (A) 04/26/2018 1520   LEUKOCYTESUR NEGATIVE 04/26/2018 1520    Radiological Exams on Admission: Dg Chest 2 View  Result Date: 04/26/2018 CLINICAL DATA:  Acute shortness of breath. EXAM: CHEST - 2 VIEW COMPARISON:  03/16/2018 FINDINGS: Cardiomegaly and pulmonary vascular congestion noted. There is no evidence of focal airspace disease, pulmonary edema, suspicious pulmonary nodule/mass, pleural effusion, or pneumothorax. No acute bony abnormalities are identified. IMPRESSION: Cardiomegaly with pulmonary vascular congestion. Electronically Signed   By: Margarette Canada M.D.   On: 04/26/2018 12:48    EKG: Independently reviewed. Vent. rate 127 BPM PR interval * ms QRS duration 98 ms QT/QTc 289/420 ms P-R-T axes * -37 32 Atrial fibrillation with rapid ventricular response Ventricular premature complex Left axis deviation Low voltage, precordial leads Consider anterior infarct  Assessment/Plan Principal Problem:   Atrial fibrillation with RVR (HCC) Admit to stepdown/inpatient. Continue supplemental oxygen. Continue careful IV hydration. Continue Cardizem infusion. Metoprolol 5 mg IVP x1 given. Continue Inderal 10 mg p.o. 3 times daily. Magnesium sulfate 2 g IVPB.  Active Problems:   Cellulitis of right lower extremity Continue vancomycin and Zosyn per pharmacy.    UTI (urinary tract infection) Continue Zosyn and vancomycin per pharmacy.    Obstructive sleep apnea Continue CPAP and supplemental oxygen at bedtime.    Essential  hypertension Continue Cardizem 240 mg p.o. daily. Continue propranolol 10 mg p.o. 3 times daily. Continue spironolactone 25 mg p.o. daily. Continue Bumex as needed. Follow-up blood pressure, heart rate, renal function electrolytes.    Type 2 diabetes mellitus without complication, without long-term current use of insulin (HCC) Carbohydrate modified diet. Continue Jardiance or formulary equivalent. CBG monitoring regular insulin sliding scale.    Chronic diastolic CHF (congestive heart failure) (HCC) No signs of decompensation at this time. Continue beta-blocker and diuretics.    Thrombocytopenia ([place]) Secondary to sequestration secondary hypersplenism secondary to liver disease. Follow-up platelet level in a.m.    Liver cirrhosis secondary to NASH (nonalcoholic steatohepatitis) (HCC) No PT/INR to calculate MELD score. Assuming, the INR is normal or close to normal, the 90 days mortality is 6% Monitor liver function test.    Hyperlipidemia Not on medical therapy due to liver disease.    DVT prophylaxis: SCDs. Code Status: Full code. Family Communication:  Disposition Plan: Stepdown for Cardizem infusion and IV antibiotic therapy. Consults called:  Admission status: Inpatient/stepdown.   Reubin Milan MD [facility] Pager [phone].  If 7PM-7AM, please contact night-coverage [email or web address] Password [facility]  04/26/2018, 3:46 PM

## 2018-04-26 NOTE — ED Provider Notes (Signed)
[facility] DEPT Provider Note   CSN: [id number] Arrival date & time: 04/26/18  1201     History   Chief Complaint Chief Complaint  Patient presents with  . Cellulitis    HPI Hannah Khan is a 62 y.o. female.  HPI Patient presents with generalized weakness.  Has some pain and swelling in her right lower leg.  Around a month ago had a long admission to the hospital for a cellulitis at that site.  States she is felt weak and thinks her A. fib is been going fast for the last couple days.  States she thinks she has had fevers but has not checked a temperature.  More fatigue and more shortness of breath.  Weight here is 350 pounds.  States there is more redness on the leg.  Not currently on antibiotics.History of A. fib and is in chronic A. fib.  Not on anticoagulation. Past Medical History:  Diagnosis Date  . Anemia    hx of  . Arthritis    knees  . Diabetes mellitus without complication ([place])    type 2  . Fibromyalgia   . HTN (hypertension)   . Hx of colonic polyps    s/p partial colectomy  . Hyperlipidemia   . Joint pain    in knees and back spasms  . Left leg swelling    wear compression hose  . Liver cirrhosis secondary to NASH (nonalcoholic steatohepatitis) ([facility]) dx nov 2014   had enlarged spleen also   . Low blood pressure    bp varies  . Morbid obesity ([place])   . Persistent atrial fibrillation ([place])   . Pneumonia 10/2016  . PONV (postoperative nausea and vomiting)    in past none recent  . Portal hypertension ([place])   . Sleep apnea    uses cpap setting of 14  . Spleen enlarged   . 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 [facility])     Patient Active Problem List   Diagnosis Date Noted  . Atrial fibrillation with RVR ([place]) 04/26/2018  . Type 2 diabetes mellitus with other specified complication ([place])   . Cellulitis of right lower extremity 03/15/2018  . Severe sepsis ([place]) 03/14/2018  . Acute hypoxemic respiratory failure ([place])  11/01/2016  . Community acquired pneumonia of right lower lobe of lung ([place]) 10/31/2016  . Muscular abdominal pain in right upper quadrant 07/11/2016  . Chronic diastolic CHF (congestive heart failure) ([place]) 07/10/2016  . Thrombocytopenia ([place]) 07/10/2016  . Permanent atrial fibrillation ([place]) 01/30/2016  . Type 2 diabetes mellitus without complication, without long-term current use of insulin ([place]) 01/30/2016  . Acute on chronic combined systolic and diastolic CHF (congestive heart failure) ([place]) 01/26/2016  . Morbid obesity- 12/10/2015  . Atrial fibrillation with rapid ventricular response ([place]) 12/08/2015  . Melena 01/31/2015  . Aortic heart murmur 01/31/2015  . Cirrhosis of liver, with portal HTN & Acites, by CT Abdomen 07/20/13 07/23/2013  . Pancytopenia ([place]) 07/23/2013  . Obstructive sleep apnea 08/07/2007  . Essential hypertension 08/07/2007  . ALLERGIC RHINITIS 08/07/2007    Past Surgical History:  Procedure Laterality Date  . BREAST LUMPECTOMY WITH RADIOACTIVE SEED LOCALIZATION Right 08/29/2017   Procedure: RIGHT BREAST LUMPECTOMY WITH RADIOACTIVE SEEDS LOCALIZATION;  Surgeon: Erroll Luna, MD;  Location: [facility];  Service: General;  Laterality: Right;  . CESAREAN SECTION  1989  . CHOLECYSTECTOMY  20 yrs ago  . COLECTOMY  2011  . COLONOSCOPY    . DILATATION & CURETTAGE/HYSTEROSCOPY WITH MYOSURE N/A  05/06/2016   Procedure: DILATATION & CURETTAGE/HYSTEROSCOPY WITH MYOSURE WITH POLYPECTOMY;  Surgeon: Janyth Pupa, DO;  Location: [facility];  Service: Gynecology;  Laterality: N/A;  . ESOPHAGOGASTRODUODENOSCOPY (EGD) WITH PROPOFOL N/A 09/04/2013   Procedure: ESOPHAGOGASTRODUODENOSCOPY (EGD) WITH PROPOFOL;  Surgeon: [name], MD;  Location: [facility];  Service: Endoscopy;  Laterality: N/A;  . KNEE ARTHROSCOPY  yrs ago   bilateral, one done x 1, one done twice     OB History   None      Home Medications    Prior to Admission medications   Medication Sig Start Date  End Date Taking? Authorizing Provider  bumetanide (BUMEX) 2 MG tablet Take as directed Patient taking differently: Take 2-4 mg by mouth See admin instructions. May take up to 4 mg as needed for fluid 04/19/18  Yes Kilroy, Luke K, PA-C  calcium carbonate (OS-CAL - DOSED IN MG OF ELEMENTAL CALCIUM) 1250 (500 Ca) MG tablet Take 1 tablet by mouth daily with breakfast.    Yes [provider]  cyclobenzaprine (FLEXERIL) 10 MG tablet Take 10 mg by mouth 3 (three) times daily as needed for muscle spasms.   Yes [provider]  diltiazem (CARDIZEM CD) 240 MG 24 hr capsule Take 1 capsule (240 mg total) by mouth daily. Keep OV 04/09/18  Yes Skeet Latch, MD  diphenhydrAMINE (BENADRYL) 25 MG tablet Take 50 mg by mouth at bedtime as needed for sleep.   Yes [provider]  ergocalciferol (VITAMIN D2) 50000 units capsule Take 50,000 Units by mouth 2 (two) times a week. On Wednesday and Saturday   Yes [provider]  JARDIANCE 25 MG TABS tablet Take 25 mg by mouth daily after breakfast.  01/28/17  Yes [provider]  KLOR-CON M20 20 MEQ tablet Take 1 tablet (20 mEq total) by mouth daily. Patient taking differently: Take 20 mEq by mouth daily as needed (taken with fluid pill).  03/27/18  Yes Barrett, Evelene Croon, PA-C  oxyCODONE-acetaminophen (PERCOCET/ROXICET) 5-325 MG tablet Take 1 tablet by mouth every 8 (eight) hours as needed for severe pain.   Yes [provider]  propranolol (INDERAL) 10 MG tablet Take 1 tablet (10 mg total) by mouth 3 (three) times daily. 03/26/18  Yes Croitoru, Mihai, MD  spironolactone (ALDACTONE) 25 MG tablet Take 25 mg by mouth daily after breakfast.  07/24/13  Yes Thurnell Lose, MD    Family History Family History  Problem Relation Age of Onset  . Heart failure Mother   . Cancer Mother   . Hyperlipidemia Mother   . Congestive Heart Failure Mother   . Stroke Mother   . Lung cancer Father   . Cancer Father   . Cancer Brother     . Hyperlipidemia Brother   . Stroke Other     Social History Social History   Tobacco Use  . Smoking status: Former Smoker    Packs/day: 1.00    Years: 3.00    Pack years: 3.00    Types: Cigarettes    Last attempt to quit: 09/19/1974    Years since quitting: 43.6  . Smokeless tobacco: Never Used  Substance Use Topics  . Alcohol use: Yes    Comment: occ  . Drug use: No     Allergies   Celecoxib; Shellfish allergy; Barium-containing compounds; Prednisone; Statins; Fentanyl; Ibuprofen; and Tylenol [acetaminophen]   Review of Systems Review of Systems  Constitutional: Positive for appetite change and fever.  HENT: Negative for congestion.   Respiratory: Positive  for shortness of breath. Negative for cough.   Cardiovascular: Positive for leg swelling.  Gastrointestinal: Negative for abdominal pain.  Genitourinary: Negative for flank pain.  Musculoskeletal: Negative for back pain.  Skin: Positive for wound.  Neurological: Positive for weakness.  Psychiatric/Behavioral: Negative for confusion.     Physical Exam Updated Vital Signs BP 112/62   Pulse (!) 134   Temp (!) 102.6 F (39.2 C) (Rectal)   Resp 16   Ht 5' 7"  (1.702 m)   Wt (!) 158.8 kg   SpO2 99%   BMI 54.82 kg/m   Physical Exam  Constitutional: She appears well-developed.  Patient is obese.  HENT:  Head: Normocephalic.  Eyes: EOM are normal.  Neck: Neck supple.  Cardiovascular:  Irregular tachycardia  Pulmonary/Chest: Effort normal.  Abdominal: There is no tenderness.  Musculoskeletal: She exhibits edema.  Edema bilateral lower extremities.  Erythema of right lower leg.  Neurological: She is alert.  Skin: Skin is warm. Capillary refill takes less than 2 seconds.     ED Treatments / Results  Labs (all labs ordered are listed, but only abnormal results are displayed) Labs Reviewed  COMPREHENSIVE METABOLIC PANEL - Abnormal; Notable for the following components:      Result Value   Glucose,  Bld 160 (*)    AST 42 (*)    Total Bilirubin 4.7 (*)    All other components within normal limits  CBC WITH DIFFERENTIAL/PLATELET - Abnormal; Notable for the following components:   RDW 16.6 (*)    Platelets 27 (*)    Lymphs Abs 0.5 (*)    All other components within normal limits  BRAIN NATRIURETIC PEPTIDE - Abnormal; Notable for the following components:   B Natriuretic Peptide 273.8 (*)    All other components within normal limits  CULTURE, BLOOD (ROUTINE X 2)  CULTURE, BLOOD (ROUTINE X 2)  URINALYSIS, ROUTINE W REFLEX MICROSCOPIC  TROPONIN I  I-STAT CG4 LACTIC ACID, ED  I-STAT CG4 LACTIC ACID, ED    EKG EKG Interpretation  Date/Time:  Thursday April 26 2018 13:06:41 EDT Ventricular Rate:  127 PR Interval:    QRS Duration: 98 QT Interval:  289 QTC Calculation: 420 R Axis:   -37 Text Interpretation:  Atrial fibrillation with rapid ventricular response Ventricular premature complex Left axis deviation Low voltage, precordial leads Consider anterior infarct Confirmed by Davonna Belling [phone]) on 04/26/2018 1:11:05 PM   Radiology Dg Chest 2 View  Result Date: 04/26/2018 CLINICAL DATA:  Acute shortness of breath. EXAM: CHEST - 2 VIEW COMPARISON:  03/16/2018 FINDINGS: Cardiomegaly and pulmonary vascular congestion noted. There is no evidence of focal airspace disease, pulmonary edema, suspicious pulmonary nodule/mass, pleural effusion, or pneumothorax. No acute bony abnormalities are identified. IMPRESSION: Cardiomegaly with pulmonary vascular congestion. Electronically Signed   By: Margarette Canada M.D.   On: 04/26/2018 12:48    Procedures Procedures (including critical care time)  Medications Ordered in ED Medications  diltiazem (CARDIZEM) 100 mg in dextrose 5% 141m (1 mg/mL) infusion (5 mg/hr Intravenous New Bag/Given 04/26/18 1442)  vancomycin (VANCOCIN) IVPB 1000 mg/200 mL premix (has no administration in time range)  sodium chloride 0.9 % bolus 500 mL (has no  administration in time range)  insulin aspart (novoLOG) injection 0-9 Units (has no administration in time range)  ondansetron (ZOFRAN) tablet 4 mg (has no administration in time range)    Or  ondansetron (ZOFRAN) injection 4 mg (has no administration in time range)  piperacillin-tazobactam (ZOSYN) IVPB 3.375 g (3.375 g Intravenous  New Bag/Given 04/26/18 1438)     Initial Impression / Assessment and Plan / ED Course  I have reviewed the triage vital signs and the nursing notes.  Pertinent labs & imaging results that were available during my care of the patient were reviewed by me and considered in my medical decision making (see chart for details).     Patient with redness of right lower leg.  Has had infection previously.  Now started to get worse.  Rectal temperature later done and showed rectal temperature 102.6.  Normal white count and normal lactic acid.  Does have some volume overload.  History of CHF. A. fib with RVR.  History of A. fib but per cardiology note not a anticoagulation candidate.  Started on Cardizem drip. Blood pressure mildly decreased.  Will give small fluid bolus but I think volume status is somewhat overloaded already.  Will admit to stepdown. I think her heart rate being fast is likely secondary to the A. fib and last due to his severe sepsis.  Started on Zosyn and vancomycin per protocol.  Will not give full fluid bolus due to normal lactic acid and the fact that she is already volume overloaded.  Also history of thrombocytopenia.  Appears to be slightly lower than her baseline.   CRITICAL CARE Performed by: Davonna Belling  Total critical care time: 30 minutes Critical care time was exclusive of separately billable procedures and treating other patients. Critical care was necessary to treat or prevent imminent or life-threatening deterioration. Critical care was time spent personally by me on the following activities: development of treatment plan with patient  and/or surrogate as well as nursing, discussions with consultants, evaluation of patient's response to treatment, examination of patient, obtaining history from patient or surrogate, ordering and performing treatments and interventions, ordering and review of laboratory studies, ordering and review of radiographic studies, pulse oximetry and re-evaluation of patient's condition.   Final Clinical Impressions(s) / ED Diagnoses   Final diagnoses:  Cellulitis of right lower extremity  Atrial fibrillation with rapid ventricular response ([place])  Thrombocytopenia [facility])    ED Discharge Orders    None      Davonna Belling, MD 04/26/18 1530

## 2018-04-26 NOTE — ED Notes (Signed)
Pt returned from [facility].

## 2018-04-26 NOTE — ED Notes (Signed)
Olevia Bowens MD hospitalist made aware that pt is maxed out on cardizem gtt and her HR is still at 120s.  New orders placed

## 2018-04-26 NOTE — Progress Notes (Signed)
Pharmacy Antibiotic Note  Hannah Khan is a 62 y.o. female admitted on 04/26/2018 with cellulitis.  Pharmacy has been consulted for vancomycin and Zosyn dosing.  SCr 0.73 Lactic acid 1.6 WBC 8.6  Plan:  Zosyn 3.375g IV Q8H infused over 4hrs.  Vancomycin 1g IV x1 in ED for sepsis, then 2g IV q24h. (Est AUC 445)  Check vancomycin levels if remains on vancomycin > 3-4 days.  Goal AUC 400-500.  Daily SCr  Follow up renal fxn, culture results, and clinical course.  F/u ability to de-escalate antibiotics.    Height: 5' 7"  (170.2 cm) Weight: (!) 350 lb (158.8 kg) IBW/kg (Calculated) : 61.6  Temp (24hrs), Avg:100.4 F (38 C), Min:98.9 F (37.2 C), Max:102.6 F (39.2 C)  Recent Labs  Lab 04/25/18 0815 04/26/18 1216 04/26/18 1327  WBC  --  8.6  --   CREATININE 0.77 0.73  --   LATICACIDVEN  --   --  1.60    Estimated Creatinine Clearance: 115.7 mL/min (by C-G formula based on SCr of 0.73 mg/dL).    Allergies  Allergen Reactions  . Celecoxib Other (See Comments)    "speech slurred" with celebrex  . Shellfish Allergy Swelling and Rash    TINGLING AND SWELLING OF LIPS. LARGE WHELPS QUARTER-SIZE  . Barium-Containing Compounds Other (See Comments)    TACHYCARDIA  . Prednisone Other (See Comments)    TACHYCARDIA  . Statins Other (See Comments)    AGGRAVATED FIBROMYALGIA  . Fentanyl     Hallucinations   . Ibuprofen Other (See Comments)    UNSPECIFIED SPECIFIC REACTION >> "DUE TO PLATELETS"  . Tylenol [Acetaminophen] Other (See Comments)    UNSPECIFIED SPECIFIC REACTION >> "DUE TO PLATELETS"    Antimicrobials this admission: 8/8 Vancomycin >>  8/8 Zosyn >>   Dose adjustments this admission:   Microbiology results: 8/8 BCx:   Thank you for allowing pharmacy to be a part of this patient's care.  Gretta Arab PharmD, BCPS Pager [phone] 04/26/2018 3:21 PM

## 2018-04-26 NOTE — ED Notes (Signed)
Pt care assumed, verbal report obtained.  Pt is c/o not feeling comfortable in the bed.  Pt shifted in the bed with this nurse's assist.  Water given to pt.

## 2018-04-26 NOTE — ED Notes (Signed)
Bed: JK82 Expected date:  Expected time:  Means of arrival:  Comments: Hold for [facility]

## 2018-04-26 NOTE — Progress Notes (Signed)
A consult was received from an ED physician for vanc/Zosyn per pharmacy dosing.  [name] profile has been reviewed for ht/wt/allergies/indication/available labs.   A one time order has been placed for vanc 1g and Zosyn 3.375g.  Further antibiotics/pharmacy consults should be ordered by admitting physician if indicated.                       Thank you, Kara Mead 04/26/2018  2:06 PM

## 2018-04-26 NOTE — ED Triage Notes (Signed)
Pt was hospitalized about a month ago for cellulitis. Pt states that she was nauseated, with  right leg red and tender, which were the same symptoms she had last time. Pt states she noticed this morning

## 2018-04-26 NOTE — ED Notes (Signed)
Patient transported to X-ray 

## 2018-04-27 DIAGNOSIS — E119 Type 2 diabetes mellitus without complications: Secondary | ICD-10-CM

## 2018-04-27 DIAGNOSIS — I10 Essential (primary) hypertension: Secondary | ICD-10-CM

## 2018-04-27 DIAGNOSIS — L03115 Cellulitis of right lower limb: Principal | ICD-10-CM

## 2018-04-27 DIAGNOSIS — K7460 Unspecified cirrhosis of liver: Secondary | ICD-10-CM

## 2018-04-27 DIAGNOSIS — D696 Thrombocytopenia, unspecified: Secondary | ICD-10-CM

## 2018-04-27 DIAGNOSIS — I5032 Chronic diastolic (congestive) heart failure: Secondary | ICD-10-CM

## 2018-04-27 DIAGNOSIS — K7581 Nonalcoholic steatohepatitis (NASH): Secondary | ICD-10-CM

## 2018-04-27 DIAGNOSIS — I4891 Unspecified atrial fibrillation: Secondary | ICD-10-CM

## 2018-04-27 LAB — GLUCOSE, CAPILLARY
GLUCOSE-CAPILLARY: 122 mg/dL — AB (ref 70–99)
GLUCOSE-CAPILLARY: 127 mg/dL — AB (ref 70–99)
GLUCOSE-CAPILLARY: 120 mg/dL — AB (ref 70–99)
Glucose-Capillary: 119 mg/dL — ABNORMAL HIGH (ref 70–99)

## 2018-04-27 LAB — CBC WITH DIFFERENTIAL/PLATELET
Basophils Absolute: 0 10*3/uL (ref 0.0–0.1)
Basophils Relative: 1 %
Eosinophils Absolute: 0 10*3/uL (ref 0.0–0.7)
Eosinophils Relative: 0 %
HCT: 37.9 % (ref 36.0–46.0)
Hemoglobin: 12.7 g/dL (ref 12.0–15.0)
Lymphocytes Relative: 14 %
Lymphs Abs: 0.8 10*3/uL (ref 0.7–4.0)
MCH: 30.8 pg (ref 26.0–34.0)
MCHC: 33.5 g/dL (ref 30.0–36.0)
MCV: 91.8 fL (ref 78.0–100.0)
Monocytes Absolute: 0.5 10*3/uL (ref 0.1–1.0)
Monocytes Relative: 9 %
Neutro Abs: 4.4 10*3/uL (ref 1.7–7.7)
Neutrophils Relative %: 76 %
Platelets: 24 10*3/uL — CL (ref 150–400)
RBC: 4.13 MIL/uL (ref 3.87–5.11)
RDW: 16.8 % — ABNORMAL HIGH (ref 11.5–15.5)
WBC: 5.7 10*3/uL (ref 4.0–10.5)

## 2018-04-27 LAB — COMPREHENSIVE METABOLIC PANEL
ALT: 23 U/L (ref 0–44)
ANION GAP: 9 (ref 5–15)
AST: 40 U/L (ref 15–41)
Albumin: 3.2 g/dL — ABNORMAL LOW (ref 3.5–5.0)
Alkaline Phosphatase: 90 U/L (ref 38–126)
BUN: 16 mg/dL (ref 8–23)
CHLORIDE: 104 mmol/L (ref 98–111)
CO2: 24 mmol/L (ref 22–32)
Calcium: 8.6 mg/dL — ABNORMAL LOW (ref 8.9–10.3)
Creatinine, Ser: 0.68 mg/dL (ref 0.44–1.00)
Glucose, Bld: 141 mg/dL — ABNORMAL HIGH (ref 70–99)
POTASSIUM: 3.9 mmol/L (ref 3.5–5.1)
Sodium: 137 mmol/L (ref 135–145)
TOTAL PROTEIN: 7.2 g/dL (ref 6.5–8.1)
Total Bilirubin: 5.5 mg/dL — ABNORMAL HIGH (ref 0.3–1.2)

## 2018-04-27 MED ORDER — CEFAZOLIN SODIUM-DEXTROSE 2-4 GM/100ML-% IV SOLN
2.0000 g | Freq: Three times a day (TID) | INTRAVENOUS | Status: DC
  Administered 2018-04-27 – 2018-04-29 (×6): 2 g via INTRAVENOUS
  Filled 2018-04-27 (×7): qty 100

## 2018-04-27 NOTE — Progress Notes (Signed)
PROGRESS NOTE    Hannah Khan  TAV:[id number] DOB: 08-Oct-1955 DOA: 04/26/2018 PCP: Lujean Amel, MD   Brief Narrative: Hannah Khan is a 62 y.o. female with medical history significant of anemia, osteoarthritis of the knees, type 2 diabetes, fibromyalgia, hypertension, history of colon polyps, hyperlipidemia, L LE edema, liver cirrhosis due to [place], morbid obesity, persistent atrial fibrillation, history of pneumonia, sleep apnea on CPAP, thrombocytopenia secondary to sequestration. She presents secondary to right LE redness and tenderness concerning for cellulitis. She was also found to have atrial fibrillation with RVR.   Assessment & Plan:   Principal Problem:   Atrial fibrillation with RVR (HCC) Active Problems:   Obstructive sleep apnea   Essential hypertension   Type 2 diabetes mellitus without complication, without long-term current use of insulin (HCC)   Chronic diastolic CHF (congestive heart failure) (HCC)   Thrombocytopenia (HCC)   Cellulitis of right lower extremity   UTI (urinary tract infection)   Liver cirrhosis secondary to NASH (nonalcoholic steatohepatitis) (HCC)   Hyperlipidemia   Atrial fibrillation with RVR In setting of acute infection. On Cardizem 250 mg daily as an outpatient in addition to propranolol for portal hypertension. Rates improved on Cardizem drip. -Continue Cardizem drip -Continue Propranolol  Right lower leg cellulitis No erythema today. Significantly warm. No leukocytosis. Started on empiric vancomycin and Zosyn -Transition to Cefazolin -Blood cultures pending  Thrombocytopenia Chronic. Acutely worsened in setting of infection.  Chronic diastolic heart failure On Bumex and spironolactone as an outpatient, more for portal hypertension. Significant peripheral edema but does not appear to have an acute flare.  Diabetes mellitus, type 2 On Jardiance as an outpatient.  -SSI  Liver cirrhosis secondary to NASH -Continue  spironolactone -On Bumex as an outpatient as needed  UTI Urinalysis highly suggestive. Started on empiric treatment. -Antibiotics as mentioned above    DVT prophylaxis: SCDs Code Status:   Code Status: Full Code Family Communication: None at bedside Disposition Plan: Discharge pending blood cultures and improvement of atrial fibrillation   Consultants:   None  Procedures:   None  Antimicrobials:  Vancomycin  Zosyn  Cefazolin    Subjective: Right leg pain.  Objective: Vitals:   04/27/18 0900 04/27/18 1000 04/27/18 1100 04/27/18 1201  BP: (!) 110/36 (!) 107/41 (!) 96/51   Pulse: (!) 117 (!) 108 92   Resp: (!) 24 (!) 7 14   Temp:    (!) 97.5 F (36.4 C)  TempSrc:    Oral  SpO2: 99% 98% 98%   Weight:      Height:        Intake/Output Summary (Last 24 hours) at 04/27/2018 1335 Last data filed at 04/27/2018 1156 Gross per 24 hour  Intake 2016.56 ml  Output 1250 ml  Net 766.56 ml   Filed Weights   04/26/18 1212  Weight: (!) 158.8 kg    Examination:  General exam: Appears calm and comfortable Respiratory system: Clear to auscultation. Respiratory effort normal. Cardiovascular system: S1 & S2 heard, Irregular rhythm and fast rate. No murmurs, rubs, gallops or clicks. Gastrointestinal system: Abdomen is nondistended, soft and nontender. Normal bowel sounds heard. Central nervous system: Alert and oriented. No focal neurological deficits. Extremities: 2+ LE edema. No calf tenderness. Right leg warmth Skin: No cyanosis. No rashes Psychiatry: Judgement and insight appear normal. Mood & affect appropriate.     Data Reviewed: I have personally reviewed following labs and imaging studies  CBC: Recent Labs  Lab 04/26/18 1216 04/27/18 0349  WBC 8.6  5.7  NEUTROABS 7.6 4.4  HGB 13.3 12.7  HCT 39.4 37.9  MCV 92.1 91.8  PLT 27* 24*   Basic Metabolic Panel: Recent Labs  Lab 04/25/18 0815 04/26/18 1216 04/26/18 1700 04/27/18 0349  NA 139 139  --  137   K 3.9 4.3  --  3.9  CL 104 105  --  104  CO2 22 24  --  24  GLUCOSE 165* 160*  --  141*  BUN 14 15  --  16  CREATININE 0.77 0.73  --  0.68  CALCIUM 9.3 9.1  --  8.6*  MG  --   --  1.9  --   PHOS  --   --  2.8  --    GFR: Estimated Creatinine Clearance: 115.7 mL/min (by C-G formula based on SCr of 0.68 mg/dL). Liver Function Tests: Recent Labs  Lab 04/26/18 1216 04/27/18 0349  AST 42* 40  ALT 24 23  ALKPHOS 102 90  BILITOT 4.7* 5.5*  PROT 7.6 7.2  ALBUMIN 3.5 3.2*   No results for input(s): LIPASE, AMYLASE in the last 168 hours. No results for input(s): AMMONIA in the last 168 hours. Coagulation Profile: No results for input(s): INR, PROTIME in the last 168 hours. Cardiac Enzymes: Recent Labs  Lab 04/26/18 1702  TROPONINI <0.03   BNP (last 3 results) Recent Labs    04/25/18 0815  PROBNP 305*   HbA1C: No results for input(s): HGBA1C in the last 72 hours. CBG: Recent Labs  Lab 04/26/18 1707 04/26/18 2123 04/27/18 0811 04/27/18 1126  GLUCAP 136* 152* 122* 127*   Lipid Profile: No results for input(s): CHOL, HDL, LDLCALC, TRIG, CHOLHDL, LDLDIRECT in the last 72 hours. Thyroid Function Tests: No results for input(s): TSH, T4TOTAL, FREET4, T3FREE, THYROIDAB in the last 72 hours. Anemia Panel: No results for input(s): VITAMINB12, FOLATE, FERRITIN, TIBC, IRON, RETICCTPCT in the last 72 hours. Sepsis Labs: Recent Labs  Lab 04/26/18 1327 04/26/18 1708 04/26/18 1947 04/26/18 2256  LATICACIDVEN 1.60 1.95* 1.7 1.2    No results found for this or any previous visit (from the past 240 hour(s)).       Radiology Studies: Dg Chest 2 View  Result Date: 04/26/2018 CLINICAL DATA:  Acute shortness of breath. EXAM: CHEST - 2 VIEW COMPARISON:  03/16/2018 FINDINGS: Cardiomegaly and pulmonary vascular congestion noted. There is no evidence of focal airspace disease, pulmonary edema, suspicious pulmonary nodule/mass, pleural effusion, or pneumothorax. No acute bony  abnormalities are identified. IMPRESSION: Cardiomegaly with pulmonary vascular congestion. Electronically Signed   By: Margarette Canada M.D.   On: 04/26/2018 12:48        Scheduled Meds: . insulin aspart  0-9 Units Subcutaneous TID WC  . propranolol  10 mg Oral TID  . spironolactone  25 mg Oral QPC breakfast   Continuous Infusions: .  ceFAZolin (ANCEF) IV    . diltiazem (CARDIZEM) infusion 15 mg/hr (04/27/18 0600)  . lactated ringers       LOS: 1 day     Cordelia Poche, MD [facility] 04/27/2018, 1:35 PM Pager: [phone]  If 7PM-7AM, please contact night-coverage [email or web address] 04/27/2018, 1:35 PM

## 2018-04-27 NOTE — Care Management Note (Signed)
Case Management Note  Patient Details  Name: SIGNA CHEEK MRN: [id number] Date of Birth: Aug 10, 1956  Subjective/Objective:      Temp 1202.6/pulse 147/ wbc-wnl/platlets less than  24/   A.fib        Action/Plan:  Iv ancef and iv cardizem From home plan is to return.  Expected Discharge Date:  (unknown)               Expected Discharge Plan:  Home/Self Care  In-House Referral:     Discharge planning Services  CM Consult  Post Acute Care Choice:    Choice offered to:     DME Arranged:    DME Agency:     HH Arranged:    HH Agency:     Status of Service:     If discussed at [employer], dates discussed:    Additional Comments:  Leeroy Cha, RN 04/27/2018, 11:04 AM

## 2018-04-28 LAB — BASIC METABOLIC PANEL
Anion gap: 8 (ref 5–15)
BUN: 18 mg/dL (ref 8–23)
CALCIUM: 8.6 mg/dL — AB (ref 8.9–10.3)
CHLORIDE: 103 mmol/L (ref 98–111)
CO2: 24 mmol/L (ref 22–32)
CREATININE: 0.65 mg/dL (ref 0.44–1.00)
Glucose, Bld: 130 mg/dL — ABNORMAL HIGH (ref 70–99)
Potassium: 4 mmol/L (ref 3.5–5.1)
SODIUM: 135 mmol/L (ref 135–145)

## 2018-04-28 LAB — CBC WITH DIFFERENTIAL/PLATELET
BASOS PCT: 1 %
Basophils Absolute: 0 10*3/uL (ref 0.0–0.1)
EOS ABS: 0.1 10*3/uL (ref 0.0–0.7)
EOS PCT: 2 %
HCT: 35.6 % — ABNORMAL LOW (ref 36.0–46.0)
Hemoglobin: 12 g/dL (ref 12.0–15.0)
Lymphocytes Relative: 21 %
Lymphs Abs: 1 10*3/uL (ref 0.7–4.0)
MCH: 30.5 pg (ref 26.0–34.0)
MCHC: 33.7 g/dL (ref 30.0–36.0)
MCV: 90.6 fL (ref 78.0–100.0)
MONOS PCT: 12 %
Monocytes Absolute: 0.6 10*3/uL (ref 0.1–1.0)
NEUTROS PCT: 64 %
Neutro Abs: 3 10*3/uL (ref 1.7–7.7)
PLATELETS: DECREASED 10*3/uL (ref 150–400)
RBC: 3.93 MIL/uL (ref 3.87–5.11)
RDW: 16.4 % — ABNORMAL HIGH (ref 11.5–15.5)
WBC: 4.5 10*3/uL (ref 4.0–10.5)

## 2018-04-28 LAB — GLUCOSE, CAPILLARY
GLUCOSE-CAPILLARY: 161 mg/dL — AB (ref 70–99)
GLUCOSE-CAPILLARY: 111 mg/dL — AB (ref 70–99)
Glucose-Capillary: 96 mg/dL (ref 70–99)
Glucose-Capillary: 111 mg/dL — ABNORMAL HIGH (ref 70–99)

## 2018-04-28 MED ORDER — DILTIAZEM HCL ER COATED BEADS 120 MG PO CP24
240.0000 mg | ORAL_CAPSULE | Freq: Every day | ORAL | Status: DC
  Administered 2018-04-28: 240 mg via ORAL
  Filled 2018-04-28: qty 2

## 2018-04-28 NOTE — Progress Notes (Addendum)
PROGRESS NOTE    Hannah Khan  GNF:[id number] DOB: 05-15-1956 DOA: 04/26/2018 PCP: Lujean Amel, MD   Brief Narrative: Hannah Khan is a 62 y.o. female with medical history significant of anemia, osteoarthritis of the knees, type 2 diabetes, fibromyalgia, hypertension, history of colon polyps, hyperlipidemia, L LE edema, liver cirrhosis due to [place], morbid obesity, persistent atrial fibrillation, history of pneumonia, sleep apnea on CPAP, thrombocytopenia secondary to sequestration. She presents secondary to right LE redness and tenderness concerning for cellulitis. She was also found to have atrial fibrillation with RVR.   Assessment & Plan:   Principal Problem:   Atrial fibrillation with RVR (HCC) Active Problems:   Obstructive sleep apnea   Essential hypertension   Type 2 diabetes mellitus without complication, without long-term current use of insulin (HCC)   Chronic diastolic CHF (congestive heart failure) (HCC)   Thrombocytopenia (HCC)   Cellulitis of right lower extremity   UTI (urinary tract infection)   Liver cirrhosis secondary to NASH (nonalcoholic steatohepatitis) (HCC)   Hyperlipidemia   Atrial fibrillation with RVR In setting of acute infection. On Cardizem 250 mg daily as an outpatient in addition to propranolol for portal hypertension. Rates improved on Cardizem drip. -Switch to home Cardizem. Discontinue Cardizem drip -Continue Propranolol  Right lower leg cellulitis No erythema today. Significantly warm. No leukocytosis. Started on empiric vancomycin and Zosyn. Transitioned to Cefazolin. -Continue Cefazolin -Blood cultures pending  Abdominal rash Possibly cellulitis. Warm yesterday. Not painful or itchy today. ?reaction to antibiotics, although, did not have adverse reaction on last admission. -Antibiotics as mentioned above  Thrombocytopenia Chronic. Acutely worsened in setting of infection.  Chronic diastolic heart failure On Bumex and  spironolactone as an outpatient, more for portal hypertension. Significant peripheral edema but does not appear to have an acute flare.  Diabetes mellitus, type 2 On Jardiance as an outpatient.  -SSI  Liver cirrhosis secondary to NASH -Continue spironolactone -On Bumex as an outpatient as needed  UTI Urinalysis highly suggestive. Started on empiric treatment. No urine culture obtained. -Antibiotics as mentioned above    DVT prophylaxis: SCDs Code Status:   Code Status: Full Code Family Communication: None at bedside Disposition Plan: Discharge pending blood cultures and improvement of atrial fibrillation   Consultants:   None  Procedures:   None  Antimicrobials:  Vancomycin  Zosyn  Cefazolin    Subjective: Leg pain improved. Right abdominal rash  Objective: Vitals:   04/28/18 0300 04/28/18 0400 04/28/18 0500 04/28/18 0727  BP: (!) 124/58 (!) 130/58 (!) 124/46 (!) 107/55  Pulse: 94 98 (!) 107 97  Resp: 14 15 17 14   Temp:  (!) 97.5 F (36.4 C)    TempSrc:  Oral    SpO2: 99% 99% 97% 97%  Weight:      Height:        Intake/Output Summary (Last 24 hours) at 04/28/2018 0808 Last data filed at 04/28/2018 0700 Gross per 24 hour  Intake 872.17 ml  Output 2700 ml  Net -1827.83 ml   Filed Weights   04/26/18 1212  Weight: (!) 158.8 kg    Examination:  General exam: Appears calm and comfortable Respiratory system: Clear to auscultation. Respiratory effort normal. Cardiovascular system: S1 & S2 heard, irregular rhythm with normal rate. No murmurs, rubs, gallops or clicks. Gastrointestinal system: Abdomen is nondistended, soft and nontender. No organomegaly or masses felt. Normal bowel sounds heard. Central nervous system: Alert and oriented. No focal neurological deficits. Extremities: 2+ LE edema bilaterally. No calf tenderness Skin:  No cyanosis. Macular, erythematous rash on right lower abdomen. Right lower leg is warm to touch. Psychiatry: Judgement and  insight appear normal. Mood & affect appropriate.    Data Reviewed: I have personally reviewed following labs and imaging studies  CBC: Recent Labs  Lab 04/26/18 1216 04/27/18 0349 04/28/18 0419  WBC 8.6 5.7 4.5  NEUTROABS 7.6 4.4 3.0  HGB 13.3 12.7 12.0  HCT 39.4 37.9 35.6*  MCV 92.1 91.8 90.6  PLT 27* 24* PLATELET CLUMPS NOTED ON SMEAR, COUNT APPEARS DECREASED   Basic Metabolic Panel: Recent Labs  Lab 04/25/18 0815 04/26/18 1216 04/26/18 1700 04/27/18 0349 04/28/18 0419  NA 139 139  --  137 135  K 3.9 4.3  --  3.9 4.0  CL 104 105  --  104 103  CO2 22 24  --  24 24  GLUCOSE 165* 160*  --  141* 130*  BUN 14 15  --  16 18  CREATININE 0.77 0.73  --  0.68 0.65  CALCIUM 9.3 9.1  --  8.6* 8.6*  MG  --   --  1.9  --   --   PHOS  --   --  2.8  --   --    GFR: Estimated Creatinine Clearance: 115.7 mL/min (by C-G formula based on SCr of 0.65 mg/dL). Liver Function Tests: Recent Labs  Lab 04/26/18 1216 04/27/18 0349  AST 42* 40  ALT 24 23  ALKPHOS 102 90  BILITOT 4.7* 5.5*  PROT 7.6 7.2  ALBUMIN 3.5 3.2*   No results for input(s): LIPASE, AMYLASE in the last 168 hours. No results for input(s): AMMONIA in the last 168 hours. Coagulation Profile: No results for input(s): INR, PROTIME in the last 168 hours. Cardiac Enzymes: Recent Labs  Lab 04/26/18 1702  TROPONINI <0.03   BNP (last 3 results) Recent Labs    04/25/18 0815  PROBNP 305*   HbA1C: No results for input(s): HGBA1C in the last 72 hours. CBG: Recent Labs  Lab 04/26/18 2123 04/27/18 0811 04/27/18 1126 04/27/18 1615 04/27/18 2113  GLUCAP 152* 122* 127* 119* 120*   Lipid Profile: No results for input(s): CHOL, HDL, LDLCALC, TRIG, CHOLHDL, LDLDIRECT in the last 72 hours. Thyroid Function Tests: No results for input(s): TSH, T4TOTAL, FREET4, T3FREE, THYROIDAB in the last 72 hours. Anemia Panel: No results for input(s): VITAMINB12, FOLATE, FERRITIN, TIBC, IRON, RETICCTPCT in the last 72  hours. Sepsis Labs: Recent Labs  Lab 04/26/18 1327 04/26/18 1708 04/26/18 1947 04/26/18 2256  LATICACIDVEN 1.60 1.95* 1.7 1.2    Recent Results (from the past 240 hour(s))  Culture, blood (routine x 2)     Status: None (Preliminary result)   Collection Time: 04/26/18  1:08 PM  Result Value Ref Range Status   Specimen Description   Final    BLOOD LEFT FOREARM Performed at [facility] [address] [place], [place] [address]    Special Requests   Final    BOTTLES DRAWN AEROBIC AND ANAEROBIC Blood Culture results may not be optimal due to an inadequate volume of blood received in culture bottles Performed at [facility] [address] [place], [place] [address]    Culture   Final    NO GROWTH < 24 HOURS Performed at [facility] [address] [place], [place] [address]    Report Status PENDING  Incomplete  Culture, blood (routine x 2)     Status: None (Preliminary result)   Collection Time:  04/26/18  1:08 PM  Result Value Ref Range Status   Specimen Description   Final    BLOOD LEFT WRIST Performed at [facility] [address], [place], [place] [address]    Special Requests   Final    BOTTLES DRAWN AEROBIC AND ANAEROBIC Blood Culture adequate volume Performed at [facility] [address], [place], [place] [address]    Culture   Final    NO GROWTH < 24 HOURS Performed at [facility] [address], [place],  [address]    Report Status PENDING  Incomplete         Radiology Studies: Dg Chest 2 View  Result Date: 04/26/2018 CLINICAL DATA:  Acute shortness of breath. EXAM: CHEST - 2 VIEW COMPARISON:  03/16/2018 FINDINGS: Cardiomegaly and pulmonary vascular congestion noted. There is no evidence of focal airspace disease, pulmonary edema, suspicious pulmonary nodule/mass, pleural effusion, or pneumothorax. No acute bony abnormalities are identified. IMPRESSION:  Cardiomegaly with pulmonary vascular congestion. Electronically Signed   By: Margarette Canada M.D.   On: 04/26/2018 12:48        Scheduled Meds: . diltiazem  240 mg Oral Daily  . insulin aspart  0-9 Units Subcutaneous TID WC  . propranolol  10 mg Oral TID  . spironolactone  25 mg Oral QPC breakfast   Continuous Infusions: .  ceFAZolin (ANCEF) IV Stopped (04/28/18 0600)  . diltiazem (CARDIZEM) infusion 15 mg/hr (04/28/18 0500)  . lactated ringers       LOS: 2 days     Cordelia Poche, MD [facility] 04/28/2018, 8:08 AM Pager: [phone]  If 7PM-7AM, please contact night-coverage [email or web address] 04/28/2018, 8:08 AM

## 2018-04-29 DIAGNOSIS — E785 Hyperlipidemia, unspecified: Secondary | ICD-10-CM

## 2018-04-29 LAB — BASIC METABOLIC PANEL
Anion gap: 7 (ref 5–15)
BUN: 18 mg/dL (ref 8–23)
CALCIUM: 8.8 mg/dL — AB (ref 8.9–10.3)
CHLORIDE: 106 mmol/L (ref 98–111)
CO2: 25 mmol/L (ref 22–32)
CREATININE: 0.58 mg/dL (ref 0.44–1.00)
GFR calc non Af Amer: >60 mL/min (ref >60)
Glucose, Bld: 132 mg/dL — ABNORMAL HIGH (ref 70–99)
Potassium: 3.8 mmol/L (ref 3.5–5.1)
SODIUM: 138 mmol/L (ref 135–145)

## 2018-04-29 LAB — CBC WITH DIFFERENTIAL/PLATELET
BASOS PCT: 1 %
Basophils Absolute: 0 10*3/uL (ref 0.0–0.1)
EOS ABS: 0.1 10*3/uL (ref 0.0–0.7)
Eosinophils Relative: 2 %
HCT: 33.6 % — ABNORMAL LOW (ref 36.0–46.0)
Hemoglobin: 11.5 g/dL — ABNORMAL LOW (ref 12.0–15.0)
LYMPHS ABS: 0.8 10*3/uL (ref 0.7–4.0)
Lymphocytes Relative: 26 %
MCH: 31.5 pg (ref 26.0–34.0)
MCHC: 34.2 g/dL (ref 30.0–36.0)
MCV: 92.1 fL (ref 78.0–100.0)
MONOS PCT: 12 %
Monocytes Absolute: 0.4 10*3/uL (ref 0.1–1.0)
NEUTROS PCT: 59 %
Neutro Abs: 1.9 10*3/uL (ref 1.7–7.7)
Platelets: 31 10*3/uL — ABNORMAL LOW (ref 150–400)
RBC: 3.65 MIL/uL — ABNORMAL LOW (ref 3.87–5.11)
RDW: 16.2 % — AB (ref 11.5–15.5)
WBC: 3.1 10*3/uL — ABNORMAL LOW (ref 4.0–10.5)

## 2018-04-29 LAB — GLUCOSE, CAPILLARY
GLUCOSE-CAPILLARY: 112 mg/dL — AB (ref 70–99)
GLUCOSE-CAPILLARY: 110 mg/dL — AB (ref 70–99)

## 2018-04-29 MED ORDER — CEFDINIR 300 MG PO CAPS
300.0000 mg | ORAL_CAPSULE | Freq: Two times a day (BID) | ORAL | Status: DC
  Administered 2018-04-29: 300 mg via ORAL
  Filled 2018-04-29: qty 1

## 2018-04-29 MED ORDER — DILTIAZEM HCL ER COATED BEADS 300 MG PO CP24: 300.0000 mg | ORAL_CAPSULE | Freq: Every day | ORAL | 0 refills | Status: DC

## 2018-04-29 MED ORDER — CEFDINIR 300 MG PO CAPS: 300.0000 mg | ORAL_CAPSULE | Freq: Two times a day (BID) | ORAL | 0 refills | Status: AC

## 2018-04-29 MED ORDER — DILTIAZEM HCL ER COATED BEADS 180 MG PO CP24
300.0000 mg | ORAL_CAPSULE | Freq: Every day | ORAL | Status: DC
  Administered 2018-04-29: 300 mg via ORAL
  Filled 2018-04-29: qty 1

## 2018-04-29 NOTE — Progress Notes (Signed)
Discussed with patient and spouse discharge instructions, both verbalized agreement and understanding.  Patient to go home with all belongings to go home in private vehicle.

## 2018-04-29 NOTE — Discharge Summary (Signed)
Physician Discharge Summary  Hannah Khan JOI:[id number] DOB: 1956/03/27 DOA: 04/26/2018  PCP: Lujean Amel, MD  Admit date: 04/26/2018 Discharge date: 04/29/2018  Admitted From: Home Disposition: Home  Recommendations for Outpatient Follow-up:  1. Follow up with PCP in 1 week 2. Please obtain BMP/CBC in one week 3. Please follow up on the following pending results: Blood cultures  Home Health: None Equipment/Devices: None  Discharge Condition: Stable CODE STATUS: Full code Diet recommendation: Heart healthy   Brief/Interim Summary:  Admission HPI written by Reubin Milan, MD   Chief Complaint: Nausea, RLE redness and tenderness.  HPI: Hannah Khan is a 62 y.o. female with medical history significant of anemia, osteoarthritis of the knees, type 2 diabetes, fibromyalgia, hypertension, history of colon polyps, hyperlipidemia, L LE edema, liver cirrhosis due to [place], morbid obesity, persistent atrial fibrillation, history of pneumonia, sleep apnea on CPAP, thrombocytopenia secondary to sequestration is coming to the emergency department with complaints of redness, swelling, warmth and tenderness to touch of right lower extremities.  She was admitted and discharged recently due to right lower extremity cellulitis.  She and her husband were concerned, because the last time they waited too long to come to the emergency department.  She complains of fever, chills, fatigue and malaise.  No sore throat, chest pain, dizziness, palpitations, diaphoresis or pitting edema lower extremities.  The patient has been nauseated at times, but no abdominal pain, diarrhea, constipation, melena or hematochezia.  Denies dysuria, frequency or hematuria.  No polyuria, polydipsia, polyphagia or blurred vision.  Denies heat or cold intolerance  ED Course: Initial vital signs temperature 98.9 F, but subsequently the patient spiked a temperature of 102.6 F, pulse 147, respirations 22 and blood  pressure 100/50 mmHg.  Her O2 sat was 98% on room air.  She was given NS 500 mL bolus, Zosyn and vancomycin.  Her white count is 8.6 with 89% neutrophils, 6% lymphocytes and 5% monocytes.  Hemoglobin 13.3 g/dL and platelets 27.  Her urinalysis shows glucosuria more than 500 mg/dL, moderate hemoglobinuria, 5 mg of ketones, positive nitrates, negative leukocyte esterase and many bacteria.  CMP shows a glucose of 160, total bilirubin of 4.7 mg/dL.  Her AST is slightly elevated 42 units/L.  All other values were normal.  Troponin was normal.  BNP was 273.8 pg/mL.  Her chest radiograph shows cardiomegaly with pulmonary vascular congestion.   Hospital course:  Atrial fibrillation with RVR In setting of acute infection. On Cardizem 240 mg daily as an outpatient in addition to propranolol for portal hypertension. Rates improved on Cardizem drip. Transitioned to home dose of Cardizem 240 mg and then increased to 300 mg daily. Good control with no symptomatic hypotension. Continued home propranolol.  Right lower leg cellulitis No erythema but warm to touch and with a fever on admission. Improved with Cefazolin and transitioned to Cefdinir on discharge. Blood cultures no growth to date and final results are pending. 10 day total course.  Abdominal rash Possibly cellulitis. Report of warmth in the area. Not painful or itchy. Considered reaction to antibiotics, although, did not have adverse reaction on last admission. Improved with treatment of cellulitis. Recommended good skin moisturization.  Thrombocytopenia Chronic. Acutely worsened in setting of infection.  Chronic diastolic heart failure On Bumex and spironolactone as an outpatient, more for portal hypertension. Significant peripheral edema but does not appear to have an acute flare.  Diabetes mellitus, type 2 On Jardiance as an outpatient. Sliding scale insulin while inpatient  Liver  cirrhosis secondary to NASH Continued spironolactone.  Continued home regimen on discharge  UTI Urinalysis highly suggestive. Started on empiric treatment. No urine culture obtained. Treatment with Cefazolin to Cefdinir as mentioned above.  Discharge Diagnoses:  Principal Problem:   Atrial fibrillation with RVR ([place]) Active Problems:   Obstructive sleep apnea   Essential hypertension   Type 2 diabetes mellitus without complication, without long-term current use of insulin (HCC)   Chronic diastolic CHF (congestive heart failure) (HCC)   Thrombocytopenia (HCC)   Cellulitis of right lower extremity   UTI (urinary tract infection)   Liver cirrhosis secondary to NASH (nonalcoholic steatohepatitis) ([facility])   Hyperlipidemia    Discharge Instructions  Discharge Instructions    Call MD for:  redness, tenderness, or signs of infection (pain, swelling, redness, odor or green/yellow discharge around incision site)   Complete by:  As directed    Call MD for:  severe uncontrolled pain   Complete by:  As directed    Call MD for:  temperature >100.4   Complete by:  As directed    Diet - low sodium heart healthy   Complete by:  As directed    Increase activity slowly   Complete by:  As directed      Allergies as of 04/29/2018      Reactions   Celecoxib Other (See Comments)   "speech slurred" with celebrex   Shellfish Allergy Swelling, Rash   TINGLING AND SWELLING OF LIPS. LARGE WHELPS QUARTER-SIZE   Barium-containing Compounds Other (See Comments)   TACHYCARDIA   Prednisone Other (See Comments)   TACHYCARDIA   Statins Other (See Comments)   AGGRAVATED FIBROMYALGIA   Fentanyl    Hallucinations    Ibuprofen Other (See Comments)   UNSPECIFIED SPECIFIC REACTION >> "DUE TO PLATELETS"   Tylenol [acetaminophen] Other (See Comments)   UNSPECIFIED SPECIFIC REACTION >> "DUE TO PLATELETS"      Medication List    TAKE these medications   bumetanide 2 MG tablet Commonly known as:  BUMEX Take as directed What changed:    how much to  take  how to take this  when to take this  additional instructions   calcium carbonate 1250 (500 Ca) MG tablet Commonly known as:  OS-CAL - dosed in mg of elemental calcium Take 1 tablet by mouth daily with breakfast.   cefdinir 300 MG capsule Commonly known as:  OMNICEF Take 1 capsule (300 mg total) by mouth every 12 (twelve) hours for 7 days.   cyclobenzaprine 10 MG tablet Commonly known as:  FLEXERIL Take 10 mg by mouth 3 (three) times daily as needed for muscle spasms.   diltiazem 300 MG 24 hr capsule Commonly known as:  CARDIZEM CD Take 1 capsule (300 mg total) by mouth daily. Start taking on:  04/30/2018 What changed:    medication strength  how much to take  additional instructions   diphenhydrAMINE 25 MG tablet Commonly known as:  BENADRYL Take 50 mg by mouth at bedtime as needed for sleep.   ergocalciferol 50000 units capsule Commonly known as:  VITAMIN D2 Take 50,000 Units by mouth 2 (two) times a week. On Wednesday and Saturday   JARDIANCE 25 MG Tabs tablet Generic drug:  empagliflozin Take 25 mg by mouth daily after breakfast.   KLOR-CON M20 20 MEQ tablet Generic drug:  potassium chloride SA Take 1 tablet (20 mEq total) by mouth daily. What changed:    when to take this  reasons to take this  oxyCODONE-acetaminophen 5-325 MG tablet Commonly known as:  PERCOCET/ROXICET Take 1 tablet by mouth every 8 (eight) hours as needed for severe pain.   propranolol 10 MG tablet Commonly known as:  INDERAL Take 1 tablet (10 mg total) by mouth 3 (three) times daily.   spironolactone 25 MG tablet Commonly known as:  ALDACTONE Take 25 mg by mouth daily after breakfast.       Allergies  Allergen Reactions  . Celecoxib Other (See Comments)    "speech slurred" with celebrex  . Shellfish Allergy Swelling and Rash    TINGLING AND SWELLING OF LIPS. LARGE WHELPS QUARTER-SIZE  . Barium-Containing Compounds Other (See Comments)    TACHYCARDIA  .  Prednisone Other (See Comments)    TACHYCARDIA  . Statins Other (See Comments)    AGGRAVATED FIBROMYALGIA  . Fentanyl     Hallucinations   . Ibuprofen Other (See Comments)    UNSPECIFIED SPECIFIC REACTION >> "DUE TO PLATELETS"  . Tylenol [Acetaminophen] Other (See Comments)    UNSPECIFIED SPECIFIC REACTION >> "DUE TO PLATELETS"    Consultations:  None   Procedures/Studies: Dg Chest 2 View  Result Date: 04/26/2018 CLINICAL DATA:  Acute shortness of breath. EXAM: CHEST - 2 VIEW COMPARISON:  03/16/2018 FINDINGS: Cardiomegaly and pulmonary vascular congestion noted. There is no evidence of focal airspace disease, pulmonary edema, suspicious pulmonary nodule/mass, pleural effusion, or pneumothorax. No acute bony abnormalities are identified. IMPRESSION: Cardiomegaly with pulmonary vascular congestion. Electronically Signed   By: Margarette Canada M.D.   On: 04/26/2018 12:48     Subjective: No chest pain or dyspnea. Leg pain improved.  Discharge Exam: Vitals:   04/29/18 0756 04/29/18 0800  BP: (!) 105/56   Pulse: 98   Resp: (!) 21   Temp:  98.1 F (36.7 C)  SpO2: 96%    Vitals:   04/29/18 0333 04/29/18 0511 04/29/18 0756 04/29/18 0800  BP:  (!) 125/51 (!) 105/56   Pulse:  (!) 103 98   Resp:  15 (!) 21   Temp: (!) 97.5 F (36.4 C)   98.1 F (36.7 C)  TempSrc: Oral   Oral  SpO2:  96% 96%   Weight:      Height:        General: Pt is alert, awake, not in acute distress Skin: abdomen with RL macular rash that is improved Extremities: minimal right LE warmth, no cyanosis    The results of significant diagnostics from this hospitalization (including imaging, microbiology, ancillary and laboratory) are listed below for reference.     Microbiology: Recent Results (from the past 240 hour(s))  Culture, blood (routine x 2)     Status: None (Preliminary result)   Collection Time: 04/26/18  1:08 PM  Result Value Ref Range Status   Specimen Description   Final    BLOOD LEFT  FOREARM Performed at [facility], [place] [address] [place], [place] [address]    Special Requests   Final    BOTTLES DRAWN AEROBIC AND ANAEROBIC Blood Culture results may not be optimal due to an inadequate volume of blood received in culture bottles Performed at [facility] [address], [place], [place] [address]    Culture   Final    NO GROWTH 2 DAYS Performed at [facility] [address], [place], [place] [address]    Report Status PENDING  Incomplete  Culture, blood (routine x 2)     Status: None (Preliminary result)   Collection Time:  04/26/18  1:08 PM  Result Value Ref Range Status   Specimen Description   Final    BLOOD LEFT WRIST Performed at [facility] [address] [place], [place] [address]    Special Requests   Final    BOTTLES DRAWN AEROBIC AND ANAEROBIC Blood Culture adequate volume Performed at [facility] [address], [place], [place] [address]    Culture   Final    NO GROWTH 2 DAYS Performed at [facility] [address], [place], [place] [address]    Report Status PENDING  Incomplete     Labs: BNP (last 3 results) Recent Labs    04/26/18 1318  BNP 837.2*   Basic Metabolic Panel: Recent Labs  Lab 04/25/18 0815 04/26/18 1216 04/26/18 1700 04/27/18 0349 04/28/18 0419 04/29/18 0343  NA 139 139  --  137 135 138  K 3.9 4.3  --  3.9 4.0 3.8  CL 104 105  --  104 103 106  CO2 22 24  --  24 24 25   GLUCOSE 165* 160*  --  141* 130* 132*  BUN 14 15  --  16 18 18   CREATININE 0.77 0.73  --  0.68 0.65 0.58  CALCIUM 9.3 9.1  --  8.6* 8.6* 8.8*  MG  --   --  1.9  --   --   --   PHOS  --   --  2.8  --   --   --    Liver Function Tests: Recent Labs  Lab 04/26/18 1216 04/27/18 0349  AST 42* 40  ALT 24 23  ALKPHOS 102 90  BILITOT 4.7* 5.5*  PROT 7.6 7.2  ALBUMIN 3.5 3.2*   No results for input(s): LIPASE, AMYLASE in the last 168 hours. No  results for input(s): AMMONIA in the last 168 hours. CBC: Recent Labs  Lab 04/26/18 1216 04/27/18 0349 04/28/18 0419 04/29/18 0343  WBC 8.6 5.7 4.5 3.1*  NEUTROABS 7.6 4.4 3.0 1.9  HGB 13.3 12.7 12.0 11.5*  HCT 39.4 37.9 35.6* 33.6*  MCV 92.1 91.8 90.6 92.1  PLT 27* 24* PLATELET CLUMPS NOTED ON SMEAR, COUNT APPEARS DECREASED 31*   Cardiac Enzymes: Recent Labs  Lab 04/26/18 1702  TROPONINI <0.03   BNP: Invalid input(s): POCBNP CBG: Recent Labs  Lab 04/28/18 1140 04/28/18 1659 04/28/18 2127 04/29/18 0747 04/29/18 1139  GLUCAP 111* 96 111* 112* 110*   D-Dimer No results for input(s): DDIMER in the last 72 hours. Hgb A1c No results for input(s): HGBA1C in the last 72 hours. Lipid Profile No results for input(s): CHOL, HDL, LDLCALC, TRIG, CHOLHDL, LDLDIRECT in the last 72 hours. Thyroid function studies No results for input(s): TSH, T4TOTAL, T3FREE, THYROIDAB in the last 72 hours.  Invalid input(s): FREET3 Anemia work up No results for input(s): VITAMINB12, FOLATE, FERRITIN, TIBC, IRON, RETICCTPCT in the last 72 hours. Urinalysis    Component Value Date/Time   COLORURINE YELLOW 04/26/2018 [place] 04/26/2018 1520   LABSPEC 1.028 04/26/2018 1520   PHURINE 6.0 04/26/2018 1520   GLUCOSEU >=500 (A) 04/26/2018 1520   HGBUR MODERATE (A) 04/26/2018 1520   BILIRUBINUR NEGATIVE 04/26/2018 1520   KETONESUR 5 (A) 04/26/2018 1520   PROTEINUR NEGATIVE 04/26/2018 1520   UROBILINOGEN 2.0 (H) 01/31/2015 0055   NITRITE POSITIVE (A) 04/26/2018 1520   LEUKOCYTESUR NEGATIVE 04/26/2018 1520   Sepsis Labs Invalid input(s): PROCALCITONIN,  WBC,  LACTICIDVEN Microbiology Recent Results (from the past 240  hour(s))  Culture, blood (routine x 2)     Status: None (Preliminary result)   Collection Time: 04/26/18  1:08 PM  Result Value Ref Range Status   Specimen Description   Final    BLOOD LEFT FOREARM Performed at [facility] [address], [place], [place] [address]    Special Requests   Final    BOTTLES DRAWN AEROBIC AND ANAEROBIC Blood Culture results may not be optimal due to an inadequate volume of blood received in culture bottles Performed at [facility] [address] [place], [place] [address]    Culture   Final    NO GROWTH 2 DAYS Performed at [facility] [address] [place], [place] [address]    Report Status PENDING  Incomplete  Culture, blood (routine x 2)     Status: None (Preliminary result)   Collection Time: 04/26/18  1:08 PM  Result Value Ref Range Status   Specimen Description   Final    BLOOD LEFT WRIST Performed at [facility] [address], [place], [place] [address]    Special Requests   Final    BOTTLES DRAWN AEROBIC AND ANAEROBIC Blood Culture adequate volume Performed at [facility] [address] [place], [place] [address]    Culture   Final    NO GROWTH 2 DAYS Performed at [facility] [address], [place], [place] [address]    Report Status PENDING  Incomplete     SIGNED:   Cordelia Poche, MD [facility] 04/29/2018, 11:59 AM

## 2018-05-01 ENCOUNTER — Other Ambulatory Visit: Payer: Self-pay

## 2018-05-01 LAB — CULTURE, BLOOD (ROUTINE X 2)
CULTURE: NO GROWTH
CULTURE: NO GROWTH
Special Requests: ADEQUATE

## 2018-05-01 MED ORDER — BUMETANIDE 2 MG PO TABS: ORAL_TABLET | ORAL | 1 refills | Status: DC

## 2018-05-25 ENCOUNTER — Other Ambulatory Visit: Payer: Self-pay | Admitting: *Deleted

## 2018-05-25 MED ORDER — DILTIAZEM HCL ER COATED BEADS 300 MG PO CP24: 300.0000 mg | ORAL_CAPSULE | Freq: Every day | ORAL | 0 refills | Status: DC

## 2018-06-01 ENCOUNTER — Encounter: Payer: Self-pay | Admitting: Cardiovascular Disease

## 2018-06-01 ENCOUNTER — Ambulatory Visit: Payer: BC Managed Care – PPO | Admitting: Cardiovascular Disease

## 2018-06-01 VITALS — BP 120/62 | HR 94 | Ht 67.0 in | Wt 355.4 lb

## 2018-06-01 DIAGNOSIS — I5033 Acute on chronic diastolic (congestive) heart failure: Secondary | ICD-10-CM | POA: Diagnosis not present

## 2018-06-01 DIAGNOSIS — R0602 Shortness of breath: Secondary | ICD-10-CM

## 2018-06-01 DIAGNOSIS — I10 Essential (primary) hypertension: Secondary | ICD-10-CM | POA: Diagnosis not present

## 2018-06-01 DIAGNOSIS — Z5181 Encounter for therapeutic drug level monitoring: Secondary | ICD-10-CM

## 2018-06-01 DIAGNOSIS — R400 Somnolence: Secondary | ICD-10-CM | POA: Diagnosis not present

## 2018-06-01 DIAGNOSIS — I4821 Permanent atrial fibrillation: Secondary | ICD-10-CM

## 2018-06-01 DIAGNOSIS — I482 Chronic atrial fibrillation: Secondary | ICD-10-CM

## 2018-06-01 MED ORDER — DIGOXIN 125 MCG PO TABS: 0.1250 mg | ORAL_TABLET | Freq: Every day | ORAL | 3 refills | Status: DC

## 2018-06-01 NOTE — Patient Instructions (Signed)
Medication Instructions:  START DIGOXIN 0.125 MG TAKE 2 TABLETS TOMORROW MORNING AND 1 IN THE EVENING. STARTING ON Sunday JUST 1 TABLET DAILY   INCREASE YOUR BUMEX TO THREE TIMES A DAY   Labwork: NO DIGOXIN Friday 06/08/18 MORNING AND COME FOR LABS (DIG LEVEL/BNP/BMET)  Testing/Procedures: Your physician has recommended that you have a sleep study. This test records several body functions during sleep, including: brain activity, eye movement, oxygen and carbon dioxide blood levels, heart rate and rhythm, breathing rate and rhythm, the flow of air through your mouth and nose, snoring, body muscle movements, and chest and belly movement. THE OFFICE WILL CALL YOU TO SCHEDULE ONCE YOUR INSURANCE HAS APPROVED   Follow-Up: Your physician recommends that you schedule a follow-up appointment in: 2 WEEKS    If you need a refill on your cardiac medications before your next appointment, please call your pharmacy.

## 2018-06-01 NOTE — Progress Notes (Signed)
Cardiology Office Note   Date:  06/01/2018   ID:  Hannah Khan, DOB 09-Mar-1956, MRN [id number]  PCP:  Lujean Amel, MD  Cardiologist:   Skeet Latch, MD  Dr. Paulita Fujita: GI Dr. Alen Blew: Hematology  No chief complaint on file.   Patient ID:  Hannah Khan is a 62 y.o. female with hypertension, chronic atrial fibrillation, diabetes mellitus type 2, hyperlipidemia, cryptogenic cirrhosis, thrombocytopenia, and morbid obesity who presents for follow-up.  Hannah Khan was admitted to the hospital 11/2015 with atrial fibrillation with rapid ventricular response. Given her history of portal hypertension and cirrhosis she was not anticoagulated. She was started on diltiazem for rate control and continued on her home propranolol. Her rate did not stay consistently less than 100 bpm so she was started on digoxin as well.  Echo revealed normal systolic function and her thyroid function was normal.  She was admitted to the hospital 0/3833 with diastolic heart failure.  She was diuresed with IV lasix and digoxin was discontinued due to hypokalemia.  She was seen in follow up 02/2016, at which time she complained of hypotension.  Her heart rate was well-controlled but she continued to feel poorly in AF.  She was referred to Dr. Rayann Heman for consideration of a Watchman device however, he felt that she would not be a good candidate, as she will only be able to be on antiplatelets for one month and she is unlikely to remain in sinus rhythm without significant weight loss.   Since her last appointment Hannah Khan fell and broke her R arm.  This occurred in January and still has not fully healed.  She was also hospitalized July and August 2019 with cellulitis.  During that hospitalization she had atrial fibrillation with rapid ventricular response.  She was initially treated with a diltiazem drip and then transitioned back to oral diltiazem.  Since hospitalization she is had significant lower extremity edema that  is not improving with bumetanide.  She has been taking 2 mg twice daily.  Her breathing is somewhat better and she has no orthopnea.  However she gets very short of breath with even minimal exertion.  Her heart rate is been in the 90s to low 100s at rest.  Her blood pressure has been ranging from the 80s over 50s to the 120s over 60s.  She has not felt lightheaded or dizzy.  She notes a variable response to her Bumex.  Lately she does not go to the bathroom much when she takes it.  She has a long history of sleep apnea and last had her sleep study approximately 14 years ago she finds her self feeling sleepy throughout the day.  She sometimes feels like she could fall asleep sitting on the commode.  She does not feel well-rested when she first wakes up and wonders if she needs a new sleep study.   Past Medical History:  Diagnosis Date  . Anemia    hx of  . Arthritis    knees  . Diabetes mellitus without complication ([place])    type 2  . Fibromyalgia   . HTN (hypertension)   . Hx of colonic polyps    s/p partial colectomy  . Hyperlipidemia   . Joint pain    in knees and back spasms  . Left leg swelling    wear compression hose  . Liver cirrhosis secondary to NASH (nonalcoholic steatohepatitis) ([place]) dx nov 2014   had enlarged spleen also   . Low blood  pressure    bp varies  . Morbid obesity ([place])   . Persistent atrial fibrillation ([place])   . Pneumonia 10/2016  . PONV (postoperative nausea and vomiting)    in past none recent  . Portal hypertension ([place])   . Sleep apnea    uses cpap setting of 14  . Spleen enlarged   . 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 [facility])     Past Surgical History:  Procedure Laterality Date  . BREAST LUMPECTOMY WITH RADIOACTIVE SEED LOCALIZATION Right 08/29/2017   Procedure: RIGHT BREAST LUMPECTOMY WITH RADIOACTIVE SEEDS LOCALIZATION;  Surgeon: Erroll Luna, MD;  Location: [facility];  Service: General;  Laterality: Right;  . CESAREAN SECTION  1989  .  CHOLECYSTECTOMY  20 yrs ago  . COLECTOMY  2011  . COLONOSCOPY    . DILATATION & CURETTAGE/HYSTEROSCOPY WITH MYOSURE N/A 05/06/2016   Procedure: DILATATION & CURETTAGE/HYSTEROSCOPY WITH MYOSURE WITH POLYPECTOMY;  Surgeon: Janyth Pupa, DO;  Location: [facility];  Service: Gynecology;  Laterality: N/A;  . ESOPHAGOGASTRODUODENOSCOPY (EGD) WITH PROPOFOL N/A 09/04/2013   Procedure: ESOPHAGOGASTRODUODENOSCOPY (EGD) WITH PROPOFOL;  Surgeon: [name], MD;  Location: [facility];  Service: Endoscopy;  Laterality: N/A;  . KNEE ARTHROSCOPY  yrs ago   bilateral, one done x 1, one done twice     Current Outpatient Medications  Medication Sig Dispense Refill  . bumetanide (BUMEX) 2 MG tablet Take 2 mg by mouth 3 (three) times daily.    . calcium carbonate (OS-CAL - DOSED IN MG OF ELEMENTAL CALCIUM) 1250 (500 Ca) MG tablet Take 1 tablet by mouth daily with breakfast.     . cyclobenzaprine (FLEXERIL) 10 MG tablet Take 10 mg by mouth 3 (three) times daily as needed for muscle spasms.    Marland Kitchen diltiazem (CARDIZEM CD) 300 MG 24 hr capsule Take 1 capsule (300 mg total) by mouth daily. 30 capsule 0  . diphenhydrAMINE (BENADRYL) 25 MG tablet Take 50 mg by mouth at bedtime as needed for sleep.    . ergocalciferol (VITAMIN D2) 50000 units capsule Take 50,000 Units by mouth 2 (two) times a week. On Wednesday and Saturday    . JARDIANCE 25 MG TABS tablet Take 25 mg by mouth daily after breakfast.   5  . KLOR-CON M20 20 MEQ tablet Take 1 tablet (20 mEq total) by mouth daily. (Patient taking differently: Take 20 mEq by mouth daily as needed (taken with fluid pill). ) 90 tablet 3  . oxyCODONE-acetaminophen (PERCOCET/ROXICET) 5-325 MG tablet Take 1 tablet by mouth every 8 (eight) hours as needed for severe pain.    Marland Kitchen propranolol (INDERAL) 10 MG tablet Take 1 tablet (10 mg total) by mouth 3 (three) times daily. 180 tablet 1  . spironolactone (ALDACTONE) 25 MG tablet Take 25 mg by mouth daily after breakfast.     . digoxin  (LANOXIN) 0.125 MG tablet Take 1 tablet (0.125 mg total) by mouth daily. OR AS DIRECTED 95 tablet 3   No current facility-administered medications for this visit.     Allergies:   Celecoxib; Shellfish allergy; Barium-containing compounds; Prednisone; Statins; Fentanyl; Ibuprofen; and Tylenol [acetaminophen]    Social History:  The patient  reports that she quit smoking about 43 years ago. Her smoking use included cigarettes. She has a 3.00 pack-year smoking history. She has never used smokeless tobacco. She reports that she drinks alcohol. She reports that she does not use drugs.   Family History:  [name] family history includes Cancer in her brother, father, and mother; Congestive  Heart Failure in her mother; Heart failure in her mother; Hyperlipidemia in her brother and mother; Lung cancer in her father; Stroke in her mother and other.    ROS:  Please see the history of present illness.   Otherwise, review of systems are positive for none.   All other systems are reviewed and negative.    PHYSICAL EXAM: VS:  BP 120/62   Pulse 94   Ht 5' 7"  (1.702 m)   Wt (!) 355 lb 6.4 oz (161.2 kg)   BMI 55.66 kg/m  , BMI Body mass index is 55.66 kg/m. GENERAL:  Well appearing HEENT: Pupils equal round and reactive, fundi not visualized, oral mucosa unremarkable NECK:  + jugular venous distention to mid neck sitting upright, waveform within normal limits, carotid upstroke brisk and symmetric, no bruits LUNGS:  Clear to auscultation bilaterally HEART:  Irregularly irregular.  PMI not displaced or sustained,S1 and S2 within normal limits, no S3, no S4, no clicks, no rubs, no murmurs ABD:  Flat, positive bowel sounds normal in frequency in pitch, no bruits, no rebound, no guarding, no midline pulsatile mass, no hepatomegaly, no splenomegaly EXT:  2 plus pulses throughout, 2+ tense pitting edema to the upper tibia bilaterally, no cyanosis no clubbing SKIN:  No rashes no nodules NEURO:  Cranial  nerves II through XII grossly intact, motor grossly intact throughout PSYCH:  Cognitively intact, oriented to person place and time   EKG:  EKG is not ordered today.  06/06/16: Atrial fibrillation. Rate 93 bpm. Low voltage. 11/14/16: Atrial fibrillation. Rate 87 bpm. 02/07/79 atrial fibrillation. Rate 95 bpm. Low voltage precordial leads. Incomplete right bundle branch block. 06/06/17: Atrial fibrillation.  Rate 76 bpm.    Echo 12/10/15: Study Conclusions  - Procedure narrative: Transthoracic echocardiography. The study  was technically difficult, as a result of body habitus.  Intravenous contrast (Definity) was administered. - Left ventricle: The cavity size was normal. Wall thickness was  increased in a pattern of mild LVH. Systolic function was normal.  The estimated ejection fraction was in the range of 55% to 60%.  Wall motion was normal; there were no regional wall motion  abnormalities. The study is not technically sufficient to allow  evaluation of LV diastolic function. - Mitral valve: Mildly thickened leaflets . There was trivial  regurgitation. - Left atrium: The atrium was mildly dilated. - Inferior vena cava: The vessel was dilated. The respirophasic  diameter changes were blunted (< 50%), consistent with elevated  central venous pressure. - Pericardium, extracardiac: A trivial pericardial effusion was  identified posterior to the heart.  Impressions:  - Technically difficult study. The patient was in atrial flutter  during the study. The LVEF is 55-60%, mild LVH, grossly normal  wall motion, trivial MR, mild LAE, dilated IVC, trivial posterior  pericardial effusion.  Recent Labs: 04/25/2018: NT-Pro BNP 305 04/26/2018: B Natriuretic Peptide 273.8; Magnesium 1.9 04/27/2018: ALT 23 04/29/2018: BUN 18; Creatinine, Ser 0.58; Hemoglobin 11.5; Platelets 31; Potassium 3.8; Sodium 138    Lipid Panel    Component Value Date/Time   CHOL 175 07/02/2014 1138    TRIG 93 07/02/2014 1138   HDL 59 07/02/2014 1138   CHOLHDL 3.0 07/02/2014 1138   VLDL 19 07/02/2014 1138   LDLCALC 97 07/02/2014 1138      Wt Readings from Last 3 Encounters:  06/01/18 (!) 355 lb 6.4 oz (161.2 kg)  04/26/18 (!) 350 lb (158.8 kg)  04/18/18 (!) 344 lb (156 kg)     ASSESSMENT  AND PLAN:  # Persistent atrial fibrillation: Diltiazem was increased due to atrial fibrillation with rapid ventricular response in the hospital.  Since then her rates have been suboptimally controlled.  She has no more blood pressure room for titrating her nodal agents.  She is not on anticoagulation due to high risk of GI bleed and cirrhosis.  We will start digoxin 0.25 mg tomorrow morning followed by 0.125 mg in the afternoon and 0.125 mg daily after that.  Continue diltiazem and propranolol.  She will have digoxin levels checked next week.  This patients CHA2DS2-VASc Score and unadjusted Ischemic Stroke Rate (% per year) is equal to 3.2 % stroke rate/year from a score of 3 Above score calculated as 1 point each if present [CHF, HTN, DM, Vascular=MI/PAD/Aortic Plaque, Age if 65-74, or Female] Above score calculated as 2 points each if present [Age > 75, or Stroke/TIA/TE]  # Acute on chronic diastolic heart failure: Ms. Madry has increased edema and shortness of breath.  We will increase bumetanide to 3 mg twice daily.  Check a BMP in 1 week.   # OSA: Ms. Wahler does not feel well-rested in the morning despite using her CPAP.  She has a machine that does not auto titrate.  She falls asleep easily throughout the day.  Is quite possible that she needs her settings adjusted.  We will refer her for repeat sleep study.     Current medicines are reviewed at length with the patient today.  The patient does not have concerns regarding medicines.   The following changes have been made:Start digoxin and increase bumex.   Labs/ tests ordered today include:   Orders Placed This Encounter  Procedures  .  Basic metabolic panel  . Pro b natriuretic peptide (BNP)  . Digoxin level  . Split night study      Disposition:   FU with Darren Nodal C. Oval Linsey, MD, [facility] in 2 weeks.   Signed, Handy Mcloud C. Oval Linsey, MD, [facility]  06/01/2018 5:19 PM    [place]

## 2018-06-04 ENCOUNTER — Telehealth: Payer: Self-pay | Admitting: *Deleted

## 2018-06-04 ENCOUNTER — Other Ambulatory Visit: Payer: Self-pay | Admitting: Cardiovascular Disease

## 2018-06-04 DIAGNOSIS — G4733 Obstructive sleep apnea (adult) (pediatric): Secondary | ICD-10-CM

## 2018-06-04 NOTE — Telephone Encounter (Signed)
Left message to return a call to me. I need to confirm [employer] company and get a download on her CPAP machine.

## 2018-06-04 NOTE — Telephone Encounter (Signed)
Called patient to confirm that she uses [facility] for her CPAP supplier. Asked her to drop her sims card off to either myself or [facility] to get download. She states that she will drop it off to [facility] since it is near her house. After speaking with Dr Oval Linsey, we together decided to hold off on getting a new sleep study until we get download information and appointment to follow up with Dr  Claiborne Billings.

## 2018-06-06 ENCOUNTER — Telehealth: Payer: Self-pay | Admitting: Oncology

## 2018-06-06 NOTE — Telephone Encounter (Signed)
Tried to reach regarding voicemail I did leave a message

## 2018-06-07 ENCOUNTER — Telehealth: Payer: Self-pay | Admitting: *Deleted

## 2018-06-07 NOTE — Telephone Encounter (Signed)
Patient returned a call and was notified that I did receive a CPAP D/L report from [facility]. It shows that her machine is working fine at the current pressure settings. However it does show that she is not using it enough at night. Out of the last 90 days she has a average use of 5 hours /night. Patient admits to this saying that she is having a hard time sleeping. I encouraged her to attempt to use it longer if possible. She has been given appointment to see Dr Claiborne Billings in November to establish and manage care.

## 2018-06-07 NOTE — Telephone Encounter (Signed)
Left message to return a call to me.

## 2018-06-09 LAB — BASIC METABOLIC PANEL
BUN/Creatinine Ratio: 17 (ref 12–28)
BUN: 13 mg/dL (ref 8–27)
CHLORIDE: 102 mmol/L (ref 96–106)
CO2: 23 mmol/L (ref 20–29)
Calcium: 9 mg/dL (ref 8.7–10.3)
Creatinine, Ser: 0.76 mg/dL (ref 0.57–1.00)
GFR, EST AFRICAN AMERICAN: 97 mL/min/{1.73_m2} (ref >59)
GFR, EST NON AFRICAN AMERICAN: 84 mL/min/{1.73_m2} (ref >59)
Glucose: 136 mg/dL — ABNORMAL HIGH (ref 65–99)
POTASSIUM: 3.9 mmol/L (ref 3.5–5.2)
SODIUM: 140 mmol/L (ref 134–144)

## 2018-06-09 LAB — DIGOXIN LEVEL: Digoxin, Serum: <0.4 ng/mL — ABNORMAL LOW (ref 0.5–0.9)

## 2018-06-09 LAB — PRO B NATRIURETIC PEPTIDE: NT-Pro BNP: 253 pg/mL (ref 0–287)

## 2018-06-12 ENCOUNTER — Telehealth: Payer: Self-pay | Admitting: Cardiovascular Disease

## 2018-06-12 NOTE — Telephone Encounter (Signed)
Advised patient, verbalized understanding. Will keep appointment Friday

## 2018-06-12 NOTE — Telephone Encounter (Signed)
New message:     Pt is returning a call for lab results.

## 2018-06-12 NOTE — Telephone Encounter (Signed)
Notes recorded by Skeet Latch, MD on 06/10/2018 at 6:28 PM EDT Normal kidney function and electrolytes. Hear failure labs are good. Digoxin level is low. What is her heart rate running?  If <100 then continue current dose of digoxin. If >100 then increase to 0.66m daily    Spoke to patient. Patient states she reviewed mychart.  today's  Heart rate 100, yesterday  Heart rate 92-94  will defer to Dr ROval Linsey- if medication needs to change

## 2018-06-12 NOTE — Telephone Encounter (Signed)
Increase digoxin to 0.18m daily.  Repeat digoxin level in one week.

## 2018-06-15 ENCOUNTER — Inpatient Hospital Stay: Payer: BC Managed Care – PPO

## 2018-06-15 ENCOUNTER — Other Ambulatory Visit: Payer: BC Managed Care – PPO

## 2018-06-15 ENCOUNTER — Ambulatory Visit: Payer: BC Managed Care – PPO | Admitting: Cardiovascular Disease

## 2018-06-15 ENCOUNTER — Inpatient Hospital Stay: Payer: BC Managed Care – PPO | Admitting: Oncology

## 2018-06-15 ENCOUNTER — Ambulatory Visit: Payer: BC Managed Care – PPO | Admitting: Oncology

## 2018-06-18 ENCOUNTER — Telehealth: Payer: Self-pay | Admitting: Oncology

## 2018-06-18 NOTE — Telephone Encounter (Signed)
Patient called to reschedule

## 2018-06-20 ENCOUNTER — Other Ambulatory Visit: Payer: Self-pay | Admitting: Cardiovascular Disease

## 2018-06-21 ENCOUNTER — Inpatient Hospital Stay: Payer: BC Managed Care – PPO

## 2018-06-21 ENCOUNTER — Inpatient Hospital Stay: Payer: BC Managed Care – PPO | Admitting: Oncology

## 2018-06-22 ENCOUNTER — Ambulatory Visit: Payer: BC Managed Care – PPO | Admitting: Cardiovascular Disease

## 2018-06-22 ENCOUNTER — Encounter: Payer: Self-pay | Admitting: Cardiovascular Disease

## 2018-06-22 VITALS — BP 112/60 | HR 84 | Ht 67.0 in | Wt 354.0 lb

## 2018-06-22 DIAGNOSIS — I5033 Acute on chronic diastolic (congestive) heart failure: Secondary | ICD-10-CM | POA: Diagnosis not present

## 2018-06-22 DIAGNOSIS — I4821 Permanent atrial fibrillation: Secondary | ICD-10-CM

## 2018-06-22 DIAGNOSIS — Z5181 Encounter for therapeutic drug level monitoring: Secondary | ICD-10-CM | POA: Diagnosis not present

## 2018-06-22 MED ORDER — PROPRANOLOL HCL 10 MG PO TABS: 10.0000 mg | ORAL_TABLET | Freq: Three times a day (TID) | ORAL | 1 refills | Status: DC

## 2018-06-22 MED ORDER — DILTIAZEM HCL ER COATED BEADS 300 MG PO CP24: ORAL_CAPSULE | ORAL | 3 refills | Status: DC

## 2018-06-22 NOTE — Progress Notes (Signed)
Cardiology Office Note   Date:  06/22/2018   ID:  Hannah Khan, DOB [birth year], MRN [id number]  PCP:  Lujean Amel, MD  Cardiologist:   Skeet Latch, MD  Dr. Paulita Fujita: GI Dr. Alen Blew: Hematology  Chief Complaint  Khan presents with  . Follow-up    2 weeks  . Shortness of Breath  . Edema    Khan ID:  Hannah Khan is a 62 y.o. female with hypertension, chronic atrial fibrillation, diabetes mellitus type 2, hyperlipidemia, cryptogenic cirrhosis, thrombocytopenia, and morbid obesity who presents for follow-up.  Hannah Khan was admitted to [facility] hospital 11/2015 with atrial fibrillation with rapid ventricular response. Given Hannah Khan history of portal hypertension and cirrhosis Hannah Khan was not anticoagulated. Hannah Khan was started on diltiazem for rate control and continued on Hannah Khan home propranolol. Hannah Khan rate did not stay consistently less than 100 bpm so Hannah Khan was started on digoxin as well.  Echo revealed normal systolic function and Hannah Khan thyroid function was normal.  Hannah Khan was admitted to [facility] 03/8294 with diastolic heart failure.  Hannah Khan was diuresed with IV lasix and digoxin was discontinued due to hypokalemia.  Hannah Khan was seen in follow up 02/2016, at which time Hannah Khan complained of hypotension.  Hannah Khan heart rate was well-controlled but Hannah Khan continued to feel poorly in AF.  Hannah Khan was referred to Dr. Rayann Heman for consideration of a Watchman device however, he felt that Hannah Khan would not be a good candidate, as Hannah Khan will only be able to be on antiplatelets for one month and Hannah Khan is unlikely to remain in sinus rhythm without significant weight loss.   Since Hannah Khan last appointment Hannah Khan fell and broke Hannah Khan R arm.  Hannah occurred in January and still has not fully healed.  Hannah Khan was also hospitalized July and August 2019 with cellulitis.  During that hospitalization Hannah Khan had atrial fibrillation with rapid ventricular response.  Hannah Khan was initially treated with a diltiazem drip and then transitioned back to oral diltiazem.   Since hospitalization Hannah Khan is had significant lower extremity edema that is not improving with bumetanide.  Hannah Khan has been taking 2 mg twice daily.  Hannah Khan breathing is somewhat better and Hannah Khan has no orthopnea.  However Hannah Khan gets very short of breath with even minimal exertion.  Hannah Khan heart rate is been in Hannah 90s to low 100s at rest.  Hannah Khan blood pressure has been ranging from Hannah 80s over 50s to Hannah 120s over 60s.  Hannah Khan has not felt lightheaded or dizzy.  Hannah Khan notes a variable response to Hannah Khan Bumex.  Lately Hannah Khan does not go to Hannah bathroom much when Hannah Khan takes it.  Hannah Khan has a long history of sleep apnea and last had Hannah Khan sleep study approximately 14 years ago Hannah Khan finds Hannah Khan self feeling sleepy throughout Hannah day.  Hannah Khan sometimes feels like Hannah Khan could fall asleep sitting on Hannah commode.  Hannah Khan does not feel well-rested when Hannah Khan first wakes up and wonders if Hannah Khan needs a new sleep study.  At Hannah Khan last appointment Bumex was increased to 3 mg twice daily.  However Hannah Khan thought Hannah Khan was supposed be taking it 2 mg 3 times a day and was unable to do so because of schedule issues.  Hannah Khan continued to take it 2 mg twice daily.  Hannah Khan remains in atrial fibrillation and Hannah Khan rate was poorly controlled so digoxin was added to Hannah Khan regimen.  Hannah Khan heart rate has been mostly in Hannah 80s to 90s.  However, Hannah Khan has had a headache since starting  digoxin.  Hannah Khan continues to be short of breath and has lower extremity edema.  Hannah Khan has orthopnea and has been using Hannah Khan CPAP machine.  BNP was within normal limits at Hannah Khan last appointment.  Hannah Khan has not had any chest pain or pressure.   Past Medical History:  Diagnosis Date  . Anemia    hx of  . Arthritis    knees  . Diabetes mellitus without complication ([place])    type 2  . Fibromyalgia   . HTN (hypertension)   . Hx of colonic polyps    s/p partial colectomy  . Hyperlipidemia   . Joint pain    in knees and back spasms  . Left leg swelling    wear compression hose  . Liver cirrhosis secondary to NASH  (nonalcoholic steatohepatitis) ([place]) dx nov 2014   had enlarged spleen also   . Low blood pressure    bp varies  . Morbid obesity ([place])   . Persistent atrial fibrillation   . Pneumonia 10/2016  . PONV (postoperative nausea and vomiting)    in past none recent  . Portal hypertension ([place])   . Sleep apnea    uses cpap setting of 14  . Spleen enlarged   . 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 [facility])     Past Surgical History:  Procedure Laterality Date  . BREAST LUMPECTOMY WITH RADIOACTIVE SEED LOCALIZATION Right 08/29/2017   Procedure: RIGHT BREAST LUMPECTOMY WITH RADIOACTIVE SEEDS LOCALIZATION;  Surgeon: Erroll Luna, MD;  Location: [facility];  Service: General;  Laterality: Right;  . CESAREAN SECTION  1989  . CHOLECYSTECTOMY  20 yrs ago  . COLECTOMY  2011  . COLONOSCOPY    . DILATATION & CURETTAGE/HYSTEROSCOPY WITH MYOSURE N/A 05/06/2016   Procedure: DILATATION & CURETTAGE/HYSTEROSCOPY WITH MYOSURE WITH POLYPECTOMY;  Surgeon: Janyth Pupa, DO;  Location: [facility];  Service: Gynecology;  Laterality: N/A;  . ESOPHAGOGASTRODUODENOSCOPY (EGD) WITH PROPOFOL N/A 09/04/2013   Procedure: ESOPHAGOGASTRODUODENOSCOPY (EGD) WITH PROPOFOL;  Surgeon: [name], MD;  Location: [facility];  Service: Endoscopy;  Laterality: N/A;  . KNEE ARTHROSCOPY  yrs ago   bilateral, one done x 1, one done twice     Current Outpatient Medications  Medication Sig Dispense Refill  . bumetanide (BUMEX) 2 MG tablet Take 2 mg by mouth as directed. TAKE 1 AND 1/2 TABLET BY MOUTH TWICE A DAY    . calcium carbonate (OS-CAL - DOSED IN MG OF ELEMENTAL CALCIUM) 1250 (500 Ca) MG tablet Take 1 tablet by mouth daily with breakfast.     . cyclobenzaprine (FLEXERIL) 10 MG tablet Take 10 mg by mouth 3 (three) times daily as needed for muscle spasms.    . digoxin (LANOXIN) 0.125 MG tablet Take 0.125 mg by mouth. Take 2 tablet by mouth daily    . diltiazem (CARDIZEM CD) 300 MG 24 hr capsule TAKE 1 CAPSULE BY MOUTH EVERY DAY  90 capsule 3  . diphenhydrAMINE (BENADRYL) 25 MG tablet Take 50 mg by mouth at bedtime as needed for sleep.    . ergocalciferol (VITAMIN D2) 50000 units capsule Take 50,000 Units by mouth 2 (two) times a week. On Wednesday and Saturday    . JARDIANCE 25 MG TABS tablet Take 25 mg by mouth daily after breakfast.   5  . KLOR-CON M20 20 MEQ tablet Take 1 tablet (20 mEq total) by mouth daily. (Khan taking differently: Take 20 mEq by mouth daily as needed (taken with fluid pill). ) 90 tablet 3  . oxyCODONE-acetaminophen (PERCOCET/ROXICET)  5-325 MG tablet Take 1 tablet by mouth every 8 (eight) hours as needed for severe pain.    Marland Kitchen propranolol (INDERAL) 10 MG tablet Take 1 tablet (10 mg total) by mouth 3 (three) times daily. 180 tablet 1  . spironolactone (ALDACTONE) 25 MG tablet Take 25 mg by mouth daily after breakfast.      No current facility-administered medications for Hannah visit.     Allergies:   Celecoxib; Shellfish allergy; Barium-containing compounds; Prednisone; Statins; Fentanyl; Ibuprofen; and Tylenol [acetaminophen]    Social History:  Hannah Khan  reports that Hannah Khan quit smoking about 43 years ago. Hannah Khan smoking use included cigarettes. Hannah Khan has a 3.00 pack-year smoking history. Hannah Khan has never used smokeless tobacco. Hannah Khan reports that Hannah Khan drinks alcohol. Hannah Khan reports that Hannah Khan does not use drugs.   Family History:  Hannah Khan family history includes Cancer in Hannah Khan brother, father, and mother; Congestive Heart Failure in Hannah Khan mother; Heart failure in Hannah Khan mother; Hyperlipidemia in Hannah Khan brother and mother; Lung cancer in Hannah Khan father; Stroke in Hannah Khan mother and other.    ROS:  Please see Hannah history of present illness.   Otherwise, review of systems are positive for none.   All other systems are reviewed and negative.    PHYSICAL EXAM: VS:  BP 112/60 (BP Location: Left Arm, Khan Position: Sitting, Cuff Size: Large)   Pulse 84   Ht 5' 7"  (1.702 m)   Wt (!) 354 lb (160.6 kg)   BMI 55.44 kg/m   , BMI Body mass index is 55.44 kg/m. GENERAL:  Tired appearing HEENT: Pupils equal round and reactive, fundi not visualized, oral mucosa unremarkable NECK:  No jugular venous distention, waveform within normal limits, carotid upstroke brisk and symmetric, no bruits LUNGS:  Clear to auscultation bilaterally HEART:  Irregularly irregular.  PMI not displaced or sustained,S1 and S2 within normal limits, no S3, no S4, no clicks, no rubs, no murmurs ABD:  Flat, positive bowel sounds normal in frequency in pitch, no bruits, no rebound, no guarding, no midline pulsatile mass, no hepatomegaly, no splenomegaly EXT:  2 plus pulses throughout, 1+ LE edema bilaterally, no cyanosis no clubbing SKIN:  No rashes no nodules NEURO:  Cranial nerves II through XII grossly intact, motor grossly intact throughout PSYCH:  Cognitively intact, oriented to person place and time   EKG:  EKG is ordered today.  06/06/16: Atrial fibrillation. Rate 93 bpm. Low voltage. 11/14/16: Atrial fibrillation. Rate 87 bpm. 02/07/79 atrial fibrillation. Rate 95 bpm. Low voltage precordial leads. Incomplete right bundle branch block. 06/06/17: Atrial fibrillation.  Rate 76 bpm.   06/22/2018: Atrial fibrillation.  Rate 84 bpm.  Incomplete right bundle branch block.  Low voltage.  LAFB.  Echo 12/10/15: Study Conclusions  - Procedure narrative: Transthoracic echocardiography. Hannah study  was technically difficult, as a result of body habitus.  Intravenous contrast (Definity) was administered. - Left ventricle: Hannah cavity size was normal. Wall thickness was  increased in a pattern of mild LVH. Systolic function was normal.  Hannah estimated ejection fraction was in Hannah range of 55% to 60%.  Wall motion was normal; there were no regional wall motion  abnormalities. Hannah study is not technically sufficient to allow  evaluation of LV diastolic function. - Mitral valve: Mildly thickened leaflets . There was trivial  regurgitation. -  Left atrium: Hannah atrium was mildly dilated. - Inferior vena cava: Hannah vessel was dilated. Hannah respirophasic  diameter changes were blunted (< 50%), consistent with elevated  central venous pressure. -  Pericardium, extracardiac: A trivial pericardial effusion was  identified posterior to Hannah heart.  Impressions:  - Technically difficult study. Hannah Khan was in atrial flutter  during Hannah study. Hannah LVEF is 55-60%, mild LVH, grossly normal  wall motion, trivial MR, mild LAE, dilated IVC, trivial posterior  pericardial effusion.  Recent Labs: 04/26/2018: B Natriuretic Peptide 273.8; Magnesium 1.9 04/27/2018: ALT 23 04/29/2018: Hemoglobin 11.5; Platelets 31 06/08/2018: BUN 13; Creatinine, Ser 0.76; NT-Pro BNP 253; Potassium 3.9; Sodium 140    Lipid Panel    Component Value Date/Time   CHOL 175 07/02/2014 1138   TRIG 93 07/02/2014 1138   HDL 59 07/02/2014 1138   CHOLHDL 3.0 07/02/2014 1138   VLDL 19 07/02/2014 1138   LDLCALC 97 07/02/2014 1138      Wt Readings from Last 3 Encounters:  06/22/18 (!) 354 lb (160.6 kg)  06/01/18 (!) 355 lb 6.4 oz (161.2 kg)  04/26/18 (!) 350 lb (158.8 kg)     ASSESSMENT AND PLAN:  # Persistent atrial fibrillation: Diltiazem was increased due to atrial fibrillation with rapid ventricular response in Hannah hospital.  HR better controlled on digoxin but Hannah Khan has headaches.  Hannah Khan is not on anticoagulation due to high risk of GI bleed and cirrhosis.  Continue digoxin for 2 days while we work on getting fluid off.  Continue diltiazem and propranolol.  Hannah Khan CHA2DS2-VASc Score and unadjusted Ischemic Stroke Rate (% per year) is equal to 3.2 % stroke rate/year from a score of 3 Above score calculated as 1 point each if present [CHF, HTN, DM, Vascular=MI/PAD/Aortic Plaque, Age if 65-74, or Female] Above score calculated as 2 points each if present [Age > 75, or Stroke/TIA/TE]  # Acute on chronic diastolic heart failure: Ms. Abraha has increased  edema and shortness of breath.  Hannah Khan doesn't appear terribly volume overloaded but reports edema.  Increase bumetanide to 69m bid thru Saturday then 354mbid.  Check BMP in 1 week.  # OSA: Follow up scheduled with Dr. KeClaiborne Billings Hannah Khan isn't feeling well-rested and doesn't think Hannah Khan old CPAP machine is functioning properly.     Current medicines are reviewed at length with Hannah Khan today.  Hannah Khan does not have concerns regarding medicines.   Hannah following changes have been made:  Stop digoxin and increase bumex.   Labs/ tests ordered today include:   Orders Placed Hannah Encounter  Procedures  . Basic metabolic panel  . EKG 12-Lead      Disposition:   FU with Drusilla Wampole C. RaOval LinseyMD, FAEmusc [facility] Dba [facility] Cent[facility] 1 month.    Signed, Berline Semrad C. RaOval LinseyMD, FADesoto Memorial Hospital10/12/2017 1:03 PM    [place]

## 2018-06-22 NOTE — Patient Instructions (Addendum)
Medication Instructions:  STOP DIGOXIN STARTING Monday   TAKE BUMEX 4 MG (2 OF THE 2 MG TABLETS) TWICE A DAY THROUGH Sunday  STARTING Monday TAKE BUMEX 3 MG (1 AND 1/2 TABLET) TWICE A DAY   If you need a refill on your cardiac medications before your next appointment, please call your pharmacy.   Lab work: BMET END OF NEXT WEEK  If you have labs (blood work) drawn today and your tests are completely normal, you will receive your results only by: Marland Kitchen MyChart Message (if you have MyChart) OR . A paper copy in the mail If you have any lab test that is abnormal or we need to change your treatment, we will call you to review the results.  Testing/Procedures: NONE  Follow-Up: At [facility], you and your health needs are our priority.  As part of our continuing mission to provide you with exceptional heart care, we have created designated Provider Care Teams.  These Care Teams include your primary Cardiologist (physician) and Advanced Practice Providers (APPs -  Physician Assistants and Nurse Practitioners) who all work together to provide you with the care you need, when you need it. You will need a follow up appointment in [place] may see Skeet Latch, MD or one of the following Advanced Practice Providers on your designated Care Team:   Kerin Ransom, PA-C Roby Lofts, [place] . Sande Rives, PA-C

## 2018-07-11 ENCOUNTER — Other Ambulatory Visit: Payer: Self-pay | Admitting: Cardiovascular Disease

## 2018-07-11 NOTE — Telephone Encounter (Signed)
Order Providers   Prescribing Provider Encounter Provider  Skeet Latch, MD Skeet Latch, MD  Outpatient Medication Detail    Disp Refills Start End   diltiazem (CARDIZEM CD) 300 MG 24 hr capsule 90 capsule 3 06/22/2018    Sig: TAKE 1 CAPSULE BY MOUTH EVERY DAY   Sent to pharmacy as: diltiazem (CARDIZEM CD) 300 MG 24 hr capsule   E-Prescribing Status: Receipt confirmed by pharmacy (06/22/2018 12:17 PM EDT)   Pharmacy   [id number] [place], [place]

## 2018-07-18 ENCOUNTER — Other Ambulatory Visit: Payer: Self-pay | Admitting: *Deleted

## 2018-07-19 ENCOUNTER — Other Ambulatory Visit: Payer: Self-pay | Admitting: Gastroenterology

## 2018-07-19 DIAGNOSIS — K7460 Unspecified cirrhosis of liver: Secondary | ICD-10-CM

## 2018-07-27 ENCOUNTER — Telehealth: Payer: Self-pay | Admitting: Oncology

## 2018-07-27 ENCOUNTER — Ambulatory Visit
Admission: RE | Admit: 2018-07-27 | Discharge: 2018-07-27 | Disposition: A | Discharge status: Home / Self Care | Payer: BC Managed Care – PPO | Source: Ambulatory Visit | Attending: Gastroenterology | Admitting: Gastroenterology

## 2018-07-27 ENCOUNTER — Inpatient Hospital Stay: Payer: BC Managed Care – PPO | Admitting: Oncology

## 2018-07-27 ENCOUNTER — Inpatient Hospital Stay: Payer: BC Managed Care – PPO | Attending: Oncology

## 2018-07-27 VITALS — BP 133/58 | HR 105 | Temp 97.5°F | Resp 18 | Wt 361.0 lb

## 2018-07-27 DIAGNOSIS — Z79899 Other long term (current) drug therapy: Secondary | ICD-10-CM | POA: Diagnosis not present

## 2018-07-27 DIAGNOSIS — K7460 Unspecified cirrhosis of liver: Secondary | ICD-10-CM | POA: Diagnosis not present

## 2018-07-27 DIAGNOSIS — D6959 Other secondary thrombocytopenia: Secondary | ICD-10-CM | POA: Insufficient documentation

## 2018-07-27 DIAGNOSIS — D61818 Other pancytopenia: Secondary | ICD-10-CM

## 2018-07-27 DIAGNOSIS — D696 Thrombocytopenia, unspecified: Secondary | ICD-10-CM

## 2018-07-27 LAB — CBC WITH DIFFERENTIAL (CANCER CENTER ONLY)
ABS IMMATURE GRANULOCYTES: 0.01 10*3/uL (ref 0.00–0.07)
BASOS PCT: 1 %
Basophils Absolute: 0 10*3/uL (ref 0.0–0.1)
EOS ABS: 0.1 10*3/uL (ref 0.0–0.5)
EOS PCT: 2 %
HCT: 38.1 % (ref 36.0–46.0)
Hemoglobin: 12.7 g/dL (ref 12.0–15.0)
Immature Granulocytes: 0 %
Lymphocytes Relative: 21 %
Lymphs Abs: 0.8 10*3/uL (ref 0.7–4.0)
MCH: 30 pg (ref 26.0–34.0)
MCHC: 33.3 g/dL (ref 30.0–36.0)
MCV: 89.9 fL (ref 80.0–100.0)
MONO ABS: 0.4 10*3/uL (ref 0.1–1.0)
Monocytes Relative: 9 %
NEUTROS ABS: 2.7 10*3/uL (ref 1.7–7.7)
Neutrophils Relative %: 67 %
PLATELETS: 39 10*3/uL — AB (ref 150–400)
RBC: 4.24 MIL/uL (ref 3.87–5.11)
RDW: 15.3 % (ref 11.5–15.5)
WBC Count: 4 10*3/uL (ref 4.0–10.5)
nRBC: 0 % (ref 0.0–0.2)

## 2018-07-27 NOTE — Telephone Encounter (Signed)
Appts scheduled avs/calendar printed per 11/8 los

## 2018-07-27 NOTE — Progress Notes (Signed)
Hematology and Oncology Follow Up Visit  Hannah Khan [id number] 04/10/1956 62 y.o. 07/27/2018 2:57 PM  CC: [name], MD Hannah Khan, M.D. Leighton Ruff. Redmond Pulling, MD Bo Merino, M.D.    Principle Diagnosis: 62 year old woman with:   1. Thrombocytopenia due to cirrhosis of the liver and splenic sequestration diagnosed in 2010. . 2.     Tubular adenoma of the colon diagnosed in 2011.  He is status post partial colectomy without any evidence of malignancy.  Current therapy: Active surveillance  Interim History: Hannah Khan returns today for repeat evaluation.  Since her last visit, she was hospitalized in August 2019 with atrial fibrillation and rapid ventricular response as well as cellulitis which has resolved at this time.  Since that time her platelet count continues to be close to 30 range without any active bleeding.  She reports overall no complaints.  She continues to have pain in her right shoulder although her range of motion is improving slowly.  She continues to receive physical therapy.  She denies any GI complaints including hematochezia, melena or hemoptysis.  She denies any easy bruising.  Her cellulitis has improved dramatically at this time although does bleed periodically if she removes any scabs.   She does not report any headaches, blurry vision, syncope or seizures.  She denies any alteration mental status or confusion.  She does not report any fevers or chills or sweats.She has not reported any chest pain or shortness of breath.  She denied any cough, wheezing or hemoptysis.  She does not report any nausea, vomiting or abdominal pain.  Denies any changes in her bowels.  She denies any frequency urgency or hesitancy.  She denied any rashes or lesions.  She denies any bleeding or clotting tendency.  Rest of her review of systems is negative.  Medications: I have reviewed the patient's current medications.  Current Outpatient Medications  Medication Sig Dispense  Refill  . bumetanide (BUMEX) 2 MG tablet Take 2 mg by mouth as directed. TAKE 1 AND 1/2 TABLET BY MOUTH TWICE A DAY    . calcium carbonate (OS-CAL - DOSED IN MG OF ELEMENTAL CALCIUM) 1250 (500 Ca) MG tablet Take 1 tablet by mouth daily with breakfast.     . cyclobenzaprine (FLEXERIL) 10 MG tablet Take 10 mg by mouth 3 (three) times daily as needed for muscle spasms.    . digoxin (LANOXIN) 0.125 MG tablet Take 0.125 mg by mouth. Take 2 tablet by mouth daily    . diltiazem (CARDIZEM CD) 300 MG 24 hr capsule TAKE 1 CAPSULE BY MOUTH EVERY DAY 90 capsule 3  . diphenhydrAMINE (BENADRYL) 25 MG tablet Take 50 mg by mouth at bedtime as needed for sleep.    . ergocalciferol (VITAMIN D2) 50000 units capsule Take 50,000 Units by mouth 2 (two) times a week. On Wednesday and Saturday    . JARDIANCE 25 MG TABS tablet Take 25 mg by mouth daily after breakfast.   5  . KLOR-CON M20 20 MEQ tablet Take 1 tablet (20 mEq total) by mouth daily. (Patient taking differently: Take 20 mEq by mouth daily as needed (taken with fluid pill). ) 90 tablet 3  . oxyCODONE-acetaminophen (PERCOCET/ROXICET) 5-325 MG tablet Take 1 tablet by mouth every 8 (eight) hours as needed for severe pain.    Marland Kitchen propranolol (INDERAL) 10 MG tablet Take 1 tablet (10 mg total) by mouth 3 (three) times daily. 180 tablet 1  . spironolactone (ALDACTONE) 25 MG tablet Take 25 mg by  mouth daily after breakfast.      No current facility-administered medications for this visit.     Allergies:  Allergies  Allergen Reactions  . Celecoxib Other (See Comments)    "speech slurred" with celebrex  . Shellfish Allergy Swelling and Rash    TINGLING AND SWELLING OF LIPS. LARGE WHELPS QUARTER-SIZE  . Barium-Containing Compounds Other (See Comments)    TACHYCARDIA  . Prednisone Other (See Comments)    TACHYCARDIA  . Statins Other (See Comments)    AGGRAVATED FIBROMYALGIA  . Fentanyl     Hallucinations   . Ibuprofen Other (See Comments)    UNSPECIFIED  SPECIFIC REACTION >> "DUE TO PLATELETS"  . Tylenol [Acetaminophen] Other (See Comments)    UNSPECIFIED SPECIFIC REACTION >> "DUE TO PLATELETS"    Past Medical History, Surgical history, Social history, and Family History were reviewed and updated.   Physical Exam:    ECOG: 1   General appearance: Alert, awake without any distress. Head:  No abnormalities noted on exam. Oropharynx: Without any thrush or ulcers. Eyes: No scleral icterus. Lymph nodes: No lymphadenopathy noted in the cervical, supraclavicular, or axillary nodes Heart: Regular without murmurs.  No leg edema. Lung: Clear to auscultation without any rhonchi, wheezes or dullness to percussion. Abdomin: Soft, nontender without any shifting dullness or ascites. Musculoskeletal: No clubbing or cyanosis. Neurological: No motor or sensory deficits. Skin: No ecchymosis petechiae. Psychiatric: Mood and affect appeared normal.     Lab Results: Lab Results  Component Value Date   WBC 3.1 (L) 04/29/2018   HGB 11.5 (L) 04/29/2018   HCT 33.6 (L) 04/29/2018   MCV 92.1 04/29/2018   PLT 31 (L) 04/29/2018     Chemistry      Component Value Date/Time   NA 140 06/08/2018 0955   NA 142 08/01/2014 1509   K 3.9 06/08/2018 0955   K 4.1 08/01/2014 1509   CL 102 06/08/2018 0955   CL 107 08/03/2012 1449   CO2 23 06/08/2018 0955   CO2 23 08/01/2014 1509   BUN 13 06/08/2018 0955   BUN 16.9 08/01/2014 1509   CREATININE 0.76 06/08/2018 0955   CREATININE 0.77 06/28/2016 1157   CREATININE 0.7 08/01/2014 1509      Component Value Date/Time   CALCIUM 9.0 06/08/2018 0955   CALCIUM 9.3 08/01/2014 1509   ALKPHOS 90 04/27/2018 0349   ALKPHOS 82 08/01/2014 1509   AST 40 04/27/2018 0349   AST 54 (H) 08/01/2014 1509   ALT 23 04/27/2018 0349   ALT 30 08/01/2014 1509   BILITOT 5.5 (H) 04/27/2018 0349   BILITOT 1.71 (H) 08/01/2014 6217       62 year old woman with:  1.  Thrombocytopenia: Diagnosed in 2010 related to splenic  sequestration from cirrhosis of the liver.  He remains on active surveillance without any intervention at this time.  Laboratory data from today showed a platelet count of 39 without any active bleeding.  The natural course of this disease and treatment options were discussed today and at this time she does not require any transfusion or platelet stimulating factor.  We will continue to monitor her counts periodically.  He is agreeable to continue at this time.  .  2.  Humerus fracture: No surgical repair has been attempted at this time.  She continues to follow with trauma surgery regarding this issue.  She is improving with physical therapy at this time.  3.  Cirrhosis of the liver: No recent issues or exacerbation.  4.  Atrial  fibrillation: Continues to follow with cardiology regarding this issue.  5.  Follow-up: We will be in 6 months.   15  minutes was spent with the patient face-to-face today.  More than 50% of time was dedicated to reviewing her disease status, treatment options and laboratory values.      Roxy Cedar Tarrance Januszewski 11/8/20192:57 PM

## 2018-07-31 ENCOUNTER — Encounter: Payer: Self-pay | Admitting: Cardiovascular Disease

## 2018-07-31 ENCOUNTER — Ambulatory Visit: Payer: BC Managed Care – PPO | Admitting: Cardiovascular Disease

## 2018-07-31 VITALS — BP 144/62 | HR 88 | Ht 67.0 in | Wt 366.0 lb

## 2018-07-31 DIAGNOSIS — I482 Chronic atrial fibrillation, unspecified: Secondary | ICD-10-CM | POA: Diagnosis not present

## 2018-07-31 DIAGNOSIS — K7469 Other cirrhosis of liver: Secondary | ICD-10-CM | POA: Diagnosis not present

## 2018-07-31 DIAGNOSIS — I5032 Chronic diastolic (congestive) heart failure: Secondary | ICD-10-CM | POA: Diagnosis not present

## 2018-07-31 NOTE — Progress Notes (Signed)
Cardiology Office Note   Date:  07/31/2018   ID:  Hannah Khan, DOB 1956-09-10, MRN [id number]  PCP:  Lujean Amel, MD  Cardiologist:   Skeet Latch, MD  Dr. Paulita Fujita: GI Dr. Alen Blew: Hematology  No chief complaint on file.   Patient ID:  Hannah Khan is a 62 y.o. female with hypertension, chronic atrial fibrillation, diabetes mellitus type 2, hyperlipidemia, cryptogenic cirrhosis, thrombocytopenia, and morbid obesity who presents for follow-up.  Hannah Khan was admitted to the hospital 11/2015 with atrial fibrillation with rapid ventricular response. Given her history of portal hypertension and cirrhosis she was not anticoagulated. She was started on diltiazem for rate control and continued on her home propranolol. Her rate did not stay consistently less than 100 bpm so she was started on digoxin as well.  Echo revealed normal systolic function and her thyroid function was normal.  She was admitted to the hospital 02/5992 with diastolic heart failure.  She was diuresed with IV lasix and digoxin was discontinued due to hypokalemia.  She was seen in follow up 02/2016, at which time she complained of hypotension.  Her heart rate was well-controlled but she continued to feel poorly in AF.  She was referred to Dr. Rayann Heman for consideration of a Watchman device however, he felt that she would not be a good candidate, as she will only be able to be on antiplatelets for one month and she is unlikely to remain in sinus rhythm without significant weight loss.   Hannah Khan fell and broke her R arm 09/2017.  She was also hospitalized July and August 2019 with cellulitis.  During that hospitalization she had atrial fibrillation with rapid ventricular response.  She was initially treated with a diltiazem drip and then transitioned back to oral diltiazem.  Since hospitalization she has struggled with lower extremity edema. She notes a variable response to her Bumex.  At her last appointment Bumex was  increased to 3 mg twice daily.  However she thought she was supposed be taking it 2 mg 3 times a day and was unable to do so because of schedule issues.  She continued to take it 2 mg twice daily.  She remains in atrial fibrillation and her rate was poorly controlled so digoxin was added to her regimen.  Her heart rate has been mostly in the 80s to 90s.  However, she has had a headache since starting digoxin so it was discontinued.  She continues to be short of breath and has lower extremity edema.  She has orthopnea and has been using her CPAP machine.  BNP was within normal limits at her last appointment.  She has not had any chest pain or pressure.  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  She also noted that her last bottle of bumex was expired and she thinks this is why she had variable response to the pills.  She notes that her weight is up which is partially fluid and partially weight gain due to stress eating.     Past Medical History:  Diagnosis Date  . Anemia    hx of  . Arthritis    knees  . Diabetes mellitus without complication ([place])    type 2  . Fibromyalgia   . HTN (hypertension)   . Hx of colonic polyps    s/p partial colectomy  . Hyperlipidemia   . Joint pain    in knees and back spasms  . Left leg swelling  wear compression hose  . Liver cirrhosis secondary to NASH (nonalcoholic steatohepatitis) ([place]) dx nov 2014   had enlarged spleen also   . Low blood pressure    bp varies  . Morbid obesity ([place])   . Persistent atrial fibrillation   . Pneumonia 10/2016  . PONV (postoperative nausea and vomiting)    in past none recent  . Portal hypertension ([place])   . Sleep apnea    uses cpap setting of 14  . Spleen enlarged   . 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 [facility])     Past Surgical History:  Procedure Laterality Date  . BREAST LUMPECTOMY WITH RADIOACTIVE SEED LOCALIZATION Right 08/29/2017   Procedure: RIGHT BREAST LUMPECTOMY WITH  RADIOACTIVE SEEDS LOCALIZATION;  Surgeon: Erroll Luna, MD;  Location: [place];  Service: General;  Laterality: Right;  . CESAREAN SECTION  1989  . CHOLECYSTECTOMY  20 yrs ago  . COLECTOMY  2011  . COLONOSCOPY    . DILATATION & CURETTAGE/HYSTEROSCOPY WITH MYOSURE N/A 05/06/2016   Procedure: DILATATION & CURETTAGE/HYSTEROSCOPY WITH MYOSURE WITH POLYPECTOMY;  Surgeon: Janyth Pupa, DO;  Location: [facility];  Service: Gynecology;  Laterality: N/A;  . ESOPHAGOGASTRODUODENOSCOPY (EGD) WITH PROPOFOL N/A 09/04/2013   Procedure: ESOPHAGOGASTRODUODENOSCOPY (EGD) WITH PROPOFOL;  Surgeon: [name], MD;  Location: [facility];  Service: Endoscopy;  Laterality: N/A;  . KNEE ARTHROSCOPY  yrs ago   bilateral, one done x 1, one done twice     Current Outpatient Medications  Medication Sig Dispense Refill  . bumetanide (BUMEX) 2 MG tablet Take 2 mg by mouth as directed. TAKE 1 AND 1/2 TABLET BY MOUTH TWICE A DAY    . calcium carbonate (OS-CAL - DOSED IN MG OF ELEMENTAL CALCIUM) 1250 (500 Ca) MG tablet Take 1 tablet by mouth daily with breakfast.     . cyclobenzaprine (FLEXERIL) 10 MG tablet Take 10 mg by mouth 3 (three) times daily as needed for muscle spasms.    Marland Kitchen diltiazem (CARDIZEM CD) 300 MG 24 hr capsule TAKE 1 CAPSULE BY MOUTH EVERY DAY 90 capsule 3  . diphenhydrAMINE (BENADRYL) 25 MG tablet Take 50 mg by mouth at bedtime as needed for sleep.    . ergocalciferol (VITAMIN D2) 50000 units capsule Take 50,000 Units by mouth 2 (two) times a week. On Wednesday and Saturday    . JARDIANCE 25 MG TABS tablet Take 25 mg by mouth daily after breakfast.   5  . KLOR-CON M20 20 MEQ tablet Take 1 tablet (20 mEq total) by mouth daily. (Patient taking differently: Take 20 mEq by mouth daily as needed (taken with fluid pill). ) 90 tablet 3  . oxyCODONE-acetaminophen (PERCOCET/ROXICET) 5-325 MG tablet Take 1 tablet by mouth every 8 (eight) hours as needed for severe pain.    Marland Kitchen propranolol (INDERAL) 10 MG tablet  Take 1 tablet (10 mg total) by mouth 3 (three) times daily. 180 tablet 1  . spironolactone (ALDACTONE) 25 MG tablet Take 25 mg by mouth daily after breakfast.      No current facility-administered medications for this visit.     Allergies:   Celecoxib; Shellfish allergy; Barium-containing compounds; Prednisone; Statins; Fentanyl; Ibuprofen; and Tylenol [acetaminophen]    Social History:  The patient  reports that she quit smoking about 43 years ago. Her smoking use included cigarettes. She has a 3.00 pack-year smoking history. She has never used smokeless tobacco. She reports that she drinks alcohol. She reports that she does not use drugs.   Family History:  [name] family  history includes Cancer in her brother, father, and mother; Congestive Heart Failure in her mother; Heart failure in her mother; Hyperlipidemia in her brother and mother; Lung cancer in her father; Stroke in her mother and other.    ROS:  Please see the history of present illness.   Otherwise, review of systems are positive for none.   All other systems are reviewed and negative.    PHYSICAL EXAM: VS:  BP (!) 144/62   Pulse 88   Ht 5' 7"  (1.702 m)   Wt (!) 366 lb (166 kg)   BMI 57.32 kg/m  , BMI Body mass index is 57.32 kg/m. GENERAL:  Chronically ill-appearing HEENT: Pupils equal round and reactive, fundi not visualized, oral mucosa unremarkable NECK:  No jugular venous distention, waveform within normal limits, carotid upstroke brisk and symmetric, no bruits, no thyromegaly LUNGS:  Clear to auscultation bilaterally HEART:  Irregularly irregular.  PMI not displaced or sustained,S1 and S2 within normal limits, no S3, no S4, no clicks, no rubs, no murmurs ABD:  Flat, positive bowel sounds normal in frequency in pitch, no bruits, no rebound, no guarding, no midline pulsatile mass, no hepatomegaly, no splenomegaly EXT:  2 plus pulses throughout, 1+ LE edema, no cyanosis no clubbing SKIN:  No rashes no  nodules NEURO:  Cranial nerves II through XII grossly intact, motor grossly intact throughout PSYCH:  Cognitively intact, oriented to person place and time   EKG:  EKG is not ordered today.  06/06/16: Atrial fibrillation. Rate 93 bpm. Low voltage. 11/14/16: Atrial fibrillation. Rate 87 bpm. 02/07/79 atrial fibrillation. Rate 95 bpm. Low voltage precordial leads. Incomplete right bundle branch block. 06/06/17: Atrial fibrillation.  Rate 76 bpm.   06/22/2018: Atrial fibrillation.  Rate 84 bpm.  Incomplete right bundle branch block.  Low voltage.  LAFB.  Echo 12/10/15: Study Conclusions  - Procedure narrative: Transthoracic echocardiography. The study  was technically difficult, as a result of body habitus.  Intravenous contrast (Definity) was administered. - Left ventricle: The cavity size was normal. Wall thickness was  increased in a pattern of mild LVH. Systolic function was normal.  The estimated ejection fraction was in the range of 55% to 60%.  Wall motion was normal; there were no regional wall motion  abnormalities. The study is not technically sufficient to allow  evaluation of LV diastolic function. - Mitral valve: Mildly thickened leaflets . There was trivial  regurgitation. - Left atrium: The atrium was mildly dilated. - Inferior vena cava: The vessel was dilated. The respirophasic  diameter changes were blunted (< 50%), consistent with elevated  central venous pressure. - Pericardium, extracardiac: A trivial pericardial effusion was  identified posterior to the heart.  Impressions:  - Technically difficult study. The patient was in atrial flutter  during the study. The LVEF is 55-60%, mild LVH, grossly normal  wall motion, trivial MR, mild LAE, dilated IVC, trivial posterior  pericardial effusion.  Recent Labs: 04/26/2018: B Natriuretic Peptide 273.8; Magnesium 1.9 04/27/2018: ALT 23 06/08/2018: BUN 13; Creatinine, Ser 0.76; NT-Pro BNP 253; Potassium 3.9;  Sodium 140 07/27/2018: Hemoglobin 12.7; Platelet Count 39    Lipid Panel    Component Value Date/Time   CHOL 175 07/02/2014 1138   TRIG 93 07/02/2014 1138   HDL 59 07/02/2014 1138   CHOLHDL 3.0 07/02/2014 1138   VLDL 19 07/02/2014 1138   LDLCALC 97 07/02/2014 1138      Wt Readings from Last 3 Encounters:  07/27/18 (!) 361 lb (163.7 kg)  06/22/18 Marland Kitchen)  354 lb (160.6 kg)  06/01/18 (!) 355 lb 6.4 oz (161.2 kg)     ASSESSMENT AND PLAN:  # Persistent atrial fibrillation: Diltiazem was increased due to atrial fibrillation with rapid ventricular response in the hospital.  HR better controlled on digoxin but it was stopped 2/2 headaches.  Lately HR has been better controlled.  She is not on anticoagulation due to high risk of GI bleed and cirrhosis.   Continue diltiazem and propranolol.  This patients CHA2DS2-VASc Score and unadjusted Ischemic Stroke Rate (% per year) is equal to 3.2 % stroke rate/year from a score of 3 Above score calculated as 1 point each if present [CHF, HTN, DM, Vascular=MI/PAD/Aortic Plaque, Age if 65-74, or Female] Above score calculated as 2 points each if present [Age > 75, or Stroke/TIA/TE]  # Acute on chronic diastolic heart failure: Bumex working better since she got a new prescription.  Her work schedule makes it very difficult for her to take her diuretic as prescribed.  She works as a Pharmacist, hospital and is unable to go to the bathroom frequently after she takes it.  This makes it very challenging to manage her volume status.  She is considering early retirement versus applying for disability and she does not think she can continue to do what she is doing much longer.  # OSA: Continue CPAP.     Current medicines are reviewed at length with the patient today.  The patient does not have concerns regarding medicines.   The following changes have been made:  Stop digoxin and increase bumex.   Labs/ tests ordered today include:   No orders of the defined types were  placed in this encounter.     Disposition:   FU with Kerith Sherley C. Oval Linsey, MD, [facility] in 3 months.    Signed, Teddie Curd C. Oval Linsey, MD, [facility]  07/31/2018 3:22 PM    [facility]

## 2018-07-31 NOTE — Patient Instructions (Addendum)
Medication Instructions:  Your physician recommends that you continue on your current medications as directed. Please refer to the Current Medication list given to you today.  If you need a refill on your cardiac medications before your next appointment, please call your pharmacy.   Lab work: NONE  Testing/Procedures: NONE  Follow-Up: At [facility], you and your health needs are our priority.  As part of our continuing mission to provide you with exceptional heart care, we have created designated Provider Care Teams.  These Care Teams include your primary Cardiologist (physician) and Advanced Practice Providers (APPs -  Physician Assistants and Nurse Practitioners) who all work together to provide you with the care you need, when you need it. You will need a follow up appointment in 3 months. You may see Skeet Latch, MD or one of the following Advanced Practice Providers on your designated Care Team:   Kerin Ransom, PA-C Roby Lofts, [place] . Sande Rives, PA-C  Any Other Special Instructions Will Be Listed Below (If Applicable).

## 2018-08-06 ENCOUNTER — Ambulatory Visit: Payer: BC Managed Care – PPO | Admitting: Cardiovascular Disease

## 2018-08-06 ENCOUNTER — Encounter: Payer: Self-pay | Admitting: Cardiovascular Disease

## 2018-08-06 VITALS — BP 125/79 | HR 90 | Resp 16 | Ht 67.0 in | Wt 357.0 lb

## 2018-08-06 DIAGNOSIS — G4733 Obstructive sleep apnea (adult) (pediatric): Secondary | ICD-10-CM

## 2018-08-06 DIAGNOSIS — I482 Chronic atrial fibrillation, unspecified: Secondary | ICD-10-CM | POA: Diagnosis not present

## 2018-08-06 DIAGNOSIS — E1169 Type 2 diabetes mellitus with other specified complication: Secondary | ICD-10-CM

## 2018-08-06 DIAGNOSIS — I5032 Chronic diastolic (congestive) heart failure: Secondary | ICD-10-CM

## 2018-08-06 DIAGNOSIS — Z9989 Dependence on other enabling machines and devices: Secondary | ICD-10-CM

## 2018-08-06 NOTE — Progress Notes (Signed)
Cardiology Office Note    Date:  08/08/2018   ID:  Hannah Khan, DOB 05/07/1956, MRN [id number]  PCP:  Lujean Amel, MD  Cardiologist:  Shelva Majestic, MD (sleep); Dr. Skeet Latch  Chief Complaint  Patient presents with  . Sleep Apnea   New sleep evaluation  History of Present Illness:  Hannah Khan is a 62 y.o. female who is referred by Dr. Oval Linsey for new sleep evaluation with me.  Hannah Khan is a 62 year old female who has a history of super morbid obesity, hypertension, chronic atrial fibrillation, type 2 diabetes mellitus, hyperlipidemia, cryptogenic cirrhosis, and thrombocytopenia.  She has a long history of sleep apnea which dates back to at least 2003 when she underwent an initial sleep study at [facility] and she was found to have severe sleep apnea with an AHI of 45/h.  She has been on CPAP therapy since that time.  She initially was started with CPAP of only 7 cm water pressure but ultimately over the years this has been increased to 14 cm.  She has not been evaluated for sleep since 2008.  The years her weight has increased.  Only she had had issues with head congestion with CPAP use.  Is only, she admits to 100% compliance.  Her first CPAP machine lasted 7 years and her current machine is 62 years old and is an Nurse, children's.  A download was obtained  from July 04, 2018 through August 02, 2018 reveals 100% compliance.  She is averaging 6 hours and 27 minutes of sleep per night.  She has had a 15 cm water pressure and AHI is excellent at 0.7.  Typically she goes to bed between 11 and 1130 and wakes up at 6 AM.  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  She uses nasal pillows.  She broke her arm in January to sleep in a recliner until May.  She has noticed some hospital irritation from the nasal pillows.  She admits to being sleepy.  She is unaware of breakthrough snoring.  She is unaware of  bruxism, restless legs, hypnogognic hallucinations, or cataplexy.  The office today an Epworth Sleepiness Scale score was calculated as shown below:  Epworth Sleepiness Scale: Situation   Chance of Dozing/Sleeping (0 = never , 1 = slight chance , 2 = moderate chance , 3 = high chance )   sitting and reading 2   watching TV 2   sitting inactive in a public place 0   being a passenger in a motor vehicle for an hour or more 3   lying down in the afternoon 3   sitting and talking to someone 0   sitting quietly after lunch (no alcohol) 1   while stopped for a few minutes in traffic as the driver 0   Total Score  11   She has had some issues with acute on chronic diastolic heart failure and has persistent atrial fibrillation.  Recently she has felt that she is not well rested and has concerns regarding her old CPAP machine functioning properly.  She presents for evaluation.  Past Medical History:  Diagnosis Date  . Anemia    hx of  . Arthritis    knees  . Diabetes mellitus without complication ([place])    type 2  . Fibromyalgia   . HTN (hypertension)   . Hx of colonic polyps    s/p partial colectomy  .  Hyperlipidemia   . Joint pain    in knees and back spasms  . Left leg swelling    wear compression hose  . Liver cirrhosis secondary to NASH (nonalcoholic steatohepatitis) ([place]) dx nov 2014   had enlarged spleen also   . Low blood pressure    bp varies  . Morbid obesity ([place])   . Persistent atrial fibrillation   . Pneumonia 10/2016  . PONV (postoperative nausea and vomiting)    in past none recent  . Portal hypertension ([place])   . Sleep apnea    uses cpap setting of 14  . Spleen enlarged   . 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 [facility])     Past Surgical History:  Procedure Laterality Date  . BREAST LUMPECTOMY WITH RADIOACTIVE SEED LOCALIZATION Right 08/29/2017   Procedure: RIGHT BREAST LUMPECTOMY WITH RADIOACTIVE SEEDS LOCALIZATION;  Surgeon: Erroll Luna, MD;  Location:  [place];  Service: General;  Laterality: Right;  . CESAREAN SECTION  1989  . CHOLECYSTECTOMY  20 yrs ago  . COLECTOMY  2011  . COLONOSCOPY    . DILATATION & CURETTAGE/HYSTEROSCOPY WITH MYOSURE N/A 05/06/2016   Procedure: DILATATION & CURETTAGE/HYSTEROSCOPY WITH MYOSURE WITH POLYPECTOMY;  Surgeon: Janyth Pupa, DO;  Location: [facility];  Service: Gynecology;  Laterality: N/A;  . ESOPHAGOGASTRODUODENOSCOPY (EGD) WITH PROPOFOL N/A 09/04/2013   Procedure: ESOPHAGOGASTRODUODENOSCOPY (EGD) WITH PROPOFOL;  Surgeon: [name], MD;  Location: [facility];  Service: Endoscopy;  Laterality: N/A;  . KNEE ARTHROSCOPY  yrs ago   bilateral, one done x 1, one done twice    Current Medications: Outpatient Medications Prior to Visit  Medication Sig Dispense Refill  . bumetanide (BUMEX) 2 MG tablet Take 2 mg by mouth as directed. TAKE 1 AND 1/2 TABLET BY MOUTH TWICE A DAY    . calcium carbonate (OS-CAL - DOSED IN MG OF ELEMENTAL CALCIUM) 1250 (500 Ca) MG tablet Take 1 tablet by mouth daily with breakfast.     . cyclobenzaprine (FLEXERIL) 10 MG tablet Take 10 mg by mouth 2 (two) times daily as needed for muscle spasms.     Marland Kitchen diltiazem (CARDIZEM CD) 300 MG 24 hr capsule TAKE 1 CAPSULE BY MOUTH EVERY DAY 90 capsule 3  . ergocalciferol (VITAMIN D2) 50000 units capsule Take 50,000 Units by mouth 2 (two) times a week. On Wednesday and Saturday    . JARDIANCE 25 MG TABS tablet Take 25 mg by mouth daily after breakfast.   5  . KLOR-CON M20 20 MEQ tablet Take 1 tablet (20 mEq total) by mouth daily. (Patient taking differently: Take 20 mEq by mouth daily as needed (taken with fluid pill). ) 90 tablet 3  . propranolol (INDERAL) 10 MG tablet Take 1 tablet (10 mg total) by mouth 3 (three) times daily. (Patient taking differently: Take 10 mg by mouth 2 (two) times daily. ) 180 tablet 1  . spironolactone (ALDACTONE) 25 MG tablet Take 25 mg by mouth daily after breakfast.     . oxyCODONE-acetaminophen (PERCOCET/ROXICET)  5-325 MG tablet Take 1 tablet by mouth every 8 (eight) hours as needed for severe pain.     No facility-administered medications prior to visit.      Allergies:   Celecoxib; Shellfish allergy; Barium-containing compounds; Prednisone; Statins; Fentanyl; Ibuprofen; and Tylenol [acetaminophen]   Social History   Socioeconomic History  . Marital status: Married    Spouse name: Not on file  . Number of children: Not on file  . Years of education: Not on file  .  Highest education level: Not on file  Occupational History  . Occupation: Merchant navy officer  Social Needs  . Financial resource strain: Not on file  . Food insecurity:    Worry: Not on file    Inability: Not on file  . Transportation needs:    Medical: Not on file    Non-medical: Not on file  Tobacco Use  . Smoking status: Former Smoker    Packs/day: 1.00    Years: 3.00    Pack years: 3.00    Types: Cigarettes    Last attempt to quit: 09/19/1974    Years since quitting: 43.9  . Smokeless tobacco: Never Used  Substance and Sexual Activity  . Alcohol use: Yes    Comment: occ  . Drug use: No  . Sexual activity: Not on file  Lifestyle  . Physical activity:    Days per week: Not on file    Minutes per session: Not on file  . Stress: Not on file  Relationships  . Social connections:    Talks on phone: Not on file    Gets together: Not on file    Attends religious service: Not on file    Active member of club or organization: Not on file    Attends meetings of clubs or organizations: Not on file    Relationship status: Not on file  Other Topics Concern  . Not on file  Social History Narrative   Lives in [place] with her husband.  Instructor for early childhood development.      History is notable in that she is married.  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  Family History:  [name] family history includes Cancer in her brother,  father, and mother; Congestive Heart Failure in her mother; Heart failure in her mother; Hyperlipidemia in her brother and mother; Lung cancer in her father; Stroke in her mother and other.   ROS General: Negative; No fevers, chills, or night sweats; morbid obesity HEENT: Negative; No changes in vision or hearing, sinus congestion, difficulty swallowing Pulmonary: Negative; No cough, wheezing, shortness of breath, hemoptysis Cardiovascular: Atrial fibrillation, per lipidemia GI: Negative; No nausea, vomiting, diarrhea, or abdominal pain GU: Negative; No dysuria, hematuria, or difficulty voiding Musculoskeletal: Broke her arm in January 2019 Hematologic/Oncology: Negative; no easy bruising, bleeding Endocrine: Positive for diabetes mellitus Neuro: Negative; no changes in balance, headaches Skin: Negative; No rashes or skin lesions Psychiatric: Negative; No behavioral problems, depression Sleep: HPI  other comprehensive 14 point system review is negative.   PHYSICAL EXAM:   VS:  BP 125/79   Pulse 90   Resp 16   Ht _0  (1.702 m)   Wt (!) 357 lb (161.9 kg)   SpO2 97%   BMI 55.91 kg/m     Wt Readings from Last 3 Encounters:  08/06/18 (!) 357 lb (161.9 kg)  07/31/18 (!) 366 lb (166 kg)  07/27/18 (!) 361 lb (163.7 kg)    General: Alert, oriented, no distress.  Super morbid obesity Skin: normal turgor, no rashes, warm and dry HEENT: Normocephalic, atraumatic. Pupils equal round and reactive to light; sclera anicteric; extraocular muscles intact; Fundi no hemorrhages or exudates. Nose without nasal septal hypertrophy Mouth/Parynx benign; Mallinpatti scale 3 Neck: No JVD, no carotid bruits; normal carotid upstroke Lungs: clear to ausculatation and percussion; no wheezing or rales Chest wall: without tenderness to palpitation Heart: PMI not displaced, RRR, s1 s2 normal, 1/6 systolic  murmur, no diastolic murmur, no rubs, gallops, thrills, or heaves Abdomen: Give again central  adiposity soft, nontender; no hepatosplenomehaly, BS+; abdominal aorta nontender and not dilated by palpation. Back: no CVA tenderness Pulses 2+ Musculoskeletal: full range of motion, normal strength, no joint deformities Extremities: no clubbing cyanosis or edema, Homan's sign negative  Neurologic: grossly nonfocal; Cranial nerves grossly wnl Psychologic: Normal mood and affect  Studies/Labs Reviewed:   EKG:  EKG is ordered today.  ECG (independently read by me): Atrial fibrillation 85 bpm.  Mild RV conduction delay.  No ectopy.  Normal intervals.  Recent Labs: BMP Latest Ref Rng & Units 06/08/2018 04/29/2018 04/28/2018  Glucose 65 - 99 mg/dL 136(H) 132(H) 130(H)  BUN 8 - 27 mg/dL _0 Creatinine 0.57 - 1.00 mg/dL 0.76 0.58 0.65  BUN/Creat Ratio 12 - 28 17 - -  Sodium 134 - 144 mmol/L 140 138 135  Potassium 3.5 - 5.2 mmol/L 3.9 3.8 4.0  Chloride 96 - 106 mmol/L 102 106 103  CO2 20 - 29 mmol/L _1 Calcium 8.7 - 10.3 mg/dL 9.0 8.8(L) 8.6(L)     Hepatic Function Latest Ref Rng & Units 04/27/2018 04/26/2018 03/23/2018  Total Protein 6.5 - 8.1 g/dL 7.2 7.6 6.4(L)  Albumin 3.5 - 5.0 g/dL 3.2(L) 3.5 2.5(L)  AST 15 - 41 U/L 40 42(H) 31  ALT 0 - 44 U/L _2 Alk Phosphatase 38 - 126 U/L 90 102 69  Total Bilirubin 0.3 - 1.2 mg/dL 5.5(H) 4.7(H) 2.4(H)  Bilirubin, Direct 0.0 - 0.2 mg/dL - - -    CBC Latest Ref Rng & Units 07/27/2018 04/29/2018 04/28/2018  WBC 4.0 - 10.5 K/uL 4.0 3.1(L) 4.5  Hemoglobin 12.0 - 15.0 g/dL 12.7 11.5(L) 12.0  Hematocrit 36.0 - 46.0 % 38.1 33.6(L) 35.6(L)  Platelets 150 - 400 K/uL 39(L) 31(L) PLATELET CLUMPS NOTED ON SMEAR, COUNT APPEARS DECREASED   Lab Results  Component Value Date   MCV 89.9 07/27/2018   MCV 92.1 04/29/2018   MCV 90.6 04/28/2018   Lab Results  Component Value Date   TSH 2.932 01/27/2016   Lab Results  Component Value Date   HGBA1C 5.6 08/28/2017     BNP    Component Value Date/Time   BNP 273.8 (H) 04/26/2018 1318     ProBNP    Component Value Date/Time   PROBNP 253 06/08/2018 0955     Lipid Panel     Component Value Date/Time   CHOL 175 07/02/2014 1138   TRIG 93 07/02/2014 1138   HDL 59 07/02/2014 1138   CHOLHDL 3.0 07/02/2014 1138   VLDL 19 07/02/2014 1138   LDLCALC 97 07/02/2014 1138     RADIOLOGY: US Abdomen Limited Ruq  Result Date: 07/27/2018 CLINICAL DATA:  Cirrhosis of the liver. EXAM: ULTRASOUND ABDOMEN LIMITED RIGHT UPPER QUADRANT COMPARISON:  March 15, 2018 FINDINGS: Gallbladder: Surgically absent. Common bile duct: Diameter: 5.4 mm Liver: No focal lesion identified. Lobulated contour with diffuse increased echotexture. Portal vein is patent on color Doppler imaging with normal direction of blood flow towards the liver. IMPRESSION: Findings consistent with cirrhosis of liver. No focal liver lesion is identified. Prior cholecystectomy. Electronically Signed   By: Abelardo Diesel M.D.   On: 07/27/2018 10:56     Additional studies/ records that were reviewed today include:  I reviewed the patient's original sleep study from [facility] dated Jan 30, 2002.  I reviewed subsequent  evaluation by Dr. Annamaria Boots in 2008 CPAP titration  up to 14 cm water pressure.  Office records from Dr. Oval Linsey were reviewed.  I was able to obtain a download October 16 through August 02, 2018 from [facility].  ASSESSMENT:    1. OSA on CPAP   2. Morbid obesity ([place])   3. Chronic atrial fibrillation   4. Chronic diastolic heart failure ([place])   5. Type 2 diabetes mellitus with other specified complication, without long-term current use of insulin [facility])      PLAN:  Hannah Khan is a very pleasant 62 year old female who has a history of super morbid obesity with a BMI of 55.9, retention, permanent atrial fibrillation, diabetes mellitus, hyperlipidemia, as well as cryptogenic cirrhosis and thrombocytopenia.  She has been demonstrated to have severe obstructive sleep apnea since 2003 and has been  on CPAP therapy for 16 years.  Initially her pressures were low but ultimately these were not adequate and she required higher pressures and after CPAP titration study in 2008 a 14 cm water pressure was recommended.  Most recently she is on 15 cm water pressure.  A download obtained over the past month continues to document 100% compliance and she is averaging 6 hours and 26 minutes of sleep per night.  AHI is excellent at 0.7.  I discussed with her that some of the potential problem in her continued excessive daytime sleepiness may be inadequate sleep duration.  We discussed optimal sleep at 8 hours.  She has nasal pillows and this has recently intermittently caused some irritation.  I have suggested she try the new ResMed AirFit N 30i nasal mask which should correct her issue since this does not contain nasal pillows which may cause nares irritation.  She qualifies for a new machine with her machine currently being at 62 years old.  I am recommending that she have a ResMed AirSense 10 CPAP auto unit and we will initiate similar pressure and utilize the auto mode up to a maximum of 20 if necessary.  I discussed the fit of treatment of her severe sleep apnea particularly with reference to cardiovascular risk.  She already has permanent atrial fibrillation.  We discussed the importance of weight loss and exercise as well as proper diet.  I will see her in 3 months for reevaluation and further recommendations will be made at that time.   Medication Adjustments/Labs and Tests Ordered: Current medicines are reviewed at length with the patient today.  Concerns regarding medicines are outlined above.  Medication changes, Labs and Tests ordered today are listed in the Patient Instructions below. Patient Instructions  Follow-Up: At [facility], you and your health needs are our priority.  As part of our continuing mission to provide you with exceptional heart care, we have created designated Provider Care Teams.   These Care Teams include your primary Cardiologist (physician) and Advanced Practice Providers (APPs -  Physician Assistants and Nurse Practitioners) who all work together to provide you with the care you need, when you need it. ---3 months with Dr. Claiborne Billings (sleep clinic)  Any Other Special Instructions Will Be Listed Below (If Applicable). We will send order to [facility] for a new machine and supplies.   Please contact our sleep coordinator if you are not contacted by [facility] within 2 weeks.      Signed, Shelva Majestic, MD  08/08/2018 7:03 PM    [facility] [address], [place], [place], [place]  [address] Phone: [phone]

## 2018-08-06 NOTE — Patient Instructions (Signed)
Follow-Up: At [facility], you and your health needs are our priority.  As part of our continuing mission to provide you with exceptional heart care, we have created designated Provider Care Teams.  These Care Teams include your primary Cardiologist (physician) and Advanced Practice Providers (APPs -  Physician Assistants and Nurse Practitioners) who all work together to provide you with the care you need, when you need it. ---3 months with Dr. Claiborne Billings (sleep clinic)  Any Other Special Instructions Will Be Listed Below (If Applicable). We will send order to [facility] for a new machine and supplies.   Please contact our sleep coordinator if you are not contacted by [facility] within 2 weeks.

## 2018-08-07 ENCOUNTER — Encounter: Payer: Self-pay | Admitting: Cardiovascular Disease

## 2018-08-08 ENCOUNTER — Encounter: Payer: Self-pay | Admitting: Cardiovascular Disease

## 2018-08-09 ENCOUNTER — Other Ambulatory Visit: Payer: Self-pay | Admitting: Cardiovascular Disease

## 2018-08-09 DIAGNOSIS — G4733 Obstructive sleep apnea (adult) (pediatric): Secondary | ICD-10-CM

## 2018-08-21 ENCOUNTER — Other Ambulatory Visit: Payer: Self-pay | Admitting: Orthopedic Surgery

## 2018-08-21 DIAGNOSIS — S42293A Other displaced fracture of upper end of unspecified humerus, initial encounter for closed fracture: Secondary | ICD-10-CM

## 2018-08-23 ENCOUNTER — Other Ambulatory Visit: Payer: Self-pay | Admitting: Gastroenterology

## 2018-08-28 ENCOUNTER — Ambulatory Visit
Admission: RE | Admit: 2018-08-28 | Discharge: 2018-08-28 | Disposition: A | Discharge status: Home / Self Care | Payer: No Typology Code available for payment source | Source: Ambulatory Visit | Attending: Orthopedic Surgery | Admitting: Orthopedic Surgery

## 2018-08-28 ENCOUNTER — Other Ambulatory Visit: Payer: Self-pay

## 2018-08-28 DIAGNOSIS — S42293A Other displaced fracture of upper end of unspecified humerus, initial encounter for closed fracture: Secondary | ICD-10-CM

## 2018-09-03 ENCOUNTER — Other Ambulatory Visit: Payer: Self-pay | Admitting: Cardiovascular Disease

## 2018-09-04 ENCOUNTER — Other Ambulatory Visit: Payer: Self-pay

## 2018-09-04 ENCOUNTER — Other Ambulatory Visit: Payer: Self-pay | Admitting: Gastroenterology

## 2018-09-04 ENCOUNTER — Encounter (HOSPITAL_COMMUNITY): Payer: Self-pay | Admitting: *Deleted

## 2018-09-05 ENCOUNTER — Ambulatory Visit (HOSPITAL_COMMUNITY): Payer: BC Managed Care – PPO | Admitting: Anesthesiology

## 2018-09-05 ENCOUNTER — Encounter (HOSPITAL_COMMUNITY): Payer: Self-pay | Admitting: *Deleted

## 2018-09-05 ENCOUNTER — Encounter (HOSPITAL_COMMUNITY)
Admission: RE | Disposition: A | Discharge status: Home / Self Care | Payer: Self-pay | Source: Home / Self Care | Attending: Gastroenterology

## 2018-09-05 ENCOUNTER — Ambulatory Visit (HOSPITAL_COMMUNITY)
Admission: RE | Admit: 2018-09-05 | Discharge: 2018-09-05 | Disposition: A | Discharge status: Home / Self Care | Payer: BC Managed Care – PPO | Attending: Gastroenterology | Admitting: Gastroenterology

## 2018-09-05 DIAGNOSIS — Z87891 Personal history of nicotine dependence: Secondary | ICD-10-CM | POA: Insufficient documentation

## 2018-09-05 DIAGNOSIS — K766 Portal hypertension: Secondary | ICD-10-CM | POA: Diagnosis not present

## 2018-09-05 DIAGNOSIS — D696 Thrombocytopenia, unspecified: Secondary | ICD-10-CM | POA: Diagnosis not present

## 2018-09-05 DIAGNOSIS — G4733 Obstructive sleep apnea (adult) (pediatric): Secondary | ICD-10-CM | POA: Insufficient documentation

## 2018-09-05 DIAGNOSIS — E119 Type 2 diabetes mellitus without complications: Secondary | ICD-10-CM | POA: Diagnosis not present

## 2018-09-05 DIAGNOSIS — I110 Hypertensive heart disease with heart failure: Secondary | ICD-10-CM | POA: Diagnosis not present

## 2018-09-05 DIAGNOSIS — K7460 Unspecified cirrhosis of liver: Secondary | ICD-10-CM | POA: Insufficient documentation

## 2018-09-05 DIAGNOSIS — Z86010 Personal history of colonic polyps: Secondary | ICD-10-CM | POA: Insufficient documentation

## 2018-09-05 DIAGNOSIS — I4891 Unspecified atrial fibrillation: Secondary | ICD-10-CM | POA: Insufficient documentation

## 2018-09-05 DIAGNOSIS — K7581 Nonalcoholic steatohepatitis (NASH): Secondary | ICD-10-CM | POA: Insufficient documentation

## 2018-09-05 DIAGNOSIS — Z79899 Other long term (current) drug therapy: Secondary | ICD-10-CM | POA: Diagnosis not present

## 2018-09-05 DIAGNOSIS — Z1211 Encounter for screening for malignant neoplasm of colon: Secondary | ICD-10-CM | POA: Diagnosis not present

## 2018-09-05 DIAGNOSIS — Z6841 Body Mass Index (BMI) 40.0 and over, adult: Secondary | ICD-10-CM | POA: Insufficient documentation

## 2018-09-05 DIAGNOSIS — Z7984 Long term (current) use of oral hypoglycemic drugs: Secondary | ICD-10-CM | POA: Diagnosis not present

## 2018-09-05 DIAGNOSIS — I5032 Chronic diastolic (congestive) heart failure: Secondary | ICD-10-CM | POA: Insufficient documentation

## 2018-09-05 DIAGNOSIS — K649 Unspecified hemorrhoids: Secondary | ICD-10-CM | POA: Diagnosis not present

## 2018-09-05 DIAGNOSIS — K5730 Diverticulosis of large intestine without perforation or abscess without bleeding: Secondary | ICD-10-CM | POA: Insufficient documentation

## 2018-09-05 DIAGNOSIS — D124 Benign neoplasm of descending colon: Secondary | ICD-10-CM | POA: Diagnosis not present

## 2018-09-05 DIAGNOSIS — I480 Paroxysmal atrial fibrillation: Secondary | ICD-10-CM | POA: Insufficient documentation

## 2018-09-05 HISTORY — PX: COLONOSCOPY: SHX5424

## 2018-09-05 HISTORY — PX: POLYPECTOMY: SHX5525

## 2018-09-05 LAB — CBC
HCT: 38.3 % (ref 36.0–46.0)
Hemoglobin: 12.3 g/dL (ref 12.0–15.0)
MCH: 30.8 pg (ref 26.0–34.0)
MCHC: 32.1 g/dL (ref 30.0–36.0)
MCV: 95.8 fL (ref 80.0–100.0)
Platelets: 40 10*3/uL — ABNORMAL LOW (ref 150–400)
RBC: 4 MIL/uL (ref 3.87–5.11)
RDW: 15.8 % — ABNORMAL HIGH (ref 11.5–15.5)
WBC: 4.7 10*3/uL (ref 4.0–10.5)
nRBC: 0 % (ref 0.0–0.2)

## 2018-09-05 LAB — POCT I-STAT 4, (NA,K, GLUC, HGB,HCT)
Glucose, Bld: 119 mg/dL — ABNORMAL HIGH (ref 70–99)
HCT: 34 % — ABNORMAL LOW (ref 36.0–46.0)
Hemoglobin: 11.6 g/dL — ABNORMAL LOW (ref 12.0–15.0)
Potassium: 3.9 mmol/L (ref 3.5–5.1)
Sodium: 141 mmol/L (ref 135–145)

## 2018-09-05 LAB — TYPE AND SCREEN
ABO/RH(D): O POS
Antibody Screen: NEGATIVE

## 2018-09-05 LAB — GLUCOSE, CAPILLARY: Glucose-Capillary: 141 mg/dL — ABNORMAL HIGH (ref 70–99)

## 2018-09-05 SURGERY — COLONOSCOPY
Anesthesia: Monitor Anesthesia Care

## 2018-09-05 MED ORDER — LACTATED RINGERS IV SOLN
INTRAVENOUS | Status: DC
  Administered 2018-09-05: 1000 mL via INTRAVENOUS

## 2018-09-05 MED ORDER — SODIUM CHLORIDE 0.9 % IV SOLN
INTRAVENOUS | Status: DC
  Administered 2018-09-05: 12:00:00 via INTRAVENOUS

## 2018-09-05 MED ORDER — PROPOFOL 10 MG/ML IV BOLUS
INTRAVENOUS | Status: AC
  Filled 2018-09-05: qty 60

## 2018-09-05 MED ORDER — PROPOFOL 500 MG/50ML IV EMUL
INTRAVENOUS | Status: DC | PRN
  Administered 2018-09-05: 150 ug/kg/min via INTRAVENOUS

## 2018-09-05 MED ORDER — PROPOFOL 10 MG/ML IV BOLUS
INTRAVENOUS | Status: AC
  Filled 2018-09-05: qty 20

## 2018-09-05 MED ORDER — SODIUM CHLORIDE 0.9 % IV SOLN: INTRAVENOUS | Status: DC

## 2018-09-05 MED ORDER — LACTATED RINGERS IV SOLN: INTRAVENOUS | Status: DC

## 2018-09-05 NOTE — H&P (Signed)
Patient interval history reviewed.  Patient examined again.  There has been no change from documented H/P dated 09/04/18 (scanned into chart from our office) except as documented above.  Assessment:  1.  Personal history of colonic polyps. 2.  Cirrhosis with thrombocytopenia.  Plan:  1.  Colonoscopy. 2.  Risks (bleeding, infection, bowel perforation that could require surgery, sedation-related changes in cardiopulmonary systems), benefits (identification and possible treatment of source of symptoms, exclusion of certain causes of symptoms), and alternatives (watchful waiting, radiographic imaging studies, empiric medical treatment) of colonoscopy were explained to patient/family in detail and patient wishes to proceed. 3.  Patient given 2 units pheresed platelets pre-procedure; amount of blood product to give for patient's degree of thrombocytopenia was reviewed with Dr. Claudette Laws in pathology.

## 2018-09-05 NOTE — Transfer of Care (Signed)
Immediate Anesthesia Transfer of Care Note  Patient: Hannah Khan  Procedure(s) Performed: COLONOSCOPY (N/A )  Patient Location: PACU  Anesthesia Type:MAC  Level of Consciousness: sedated  Airway & Oxygen Therapy: Patient Spontanous Breathing and Patient connected to face mask oxygen  Post-op Assessment: Report given to RN and Post -op Vital signs reviewed and stable  Post vital signs: Reviewed and stable  Last Vitals:  Vitals Value Taken Time  BP    Temp    Pulse 104 09/05/2018 12:14 PM  Resp 13 09/05/2018 12:14 PM  SpO2 100 % 09/05/2018 12:14 PM  Vitals shown include unvalidated device data.  Last Pain:  Vitals:   09/05/18 1121  TempSrc: Oral  PainSc:          Complications: No apparent anesthesia complications

## 2018-09-05 NOTE — Discharge Instructions (Signed)
Colonoscopy, Adult, Care After This sheet gives you information about how to care for yourself after your procedure. Your doctor may also give you more specific instructions. If you have problems or questions, call your doctor. What can I expect after the procedure? After the procedure, it is common to have:  A small amount of blood in your poop for 24 hours.  Some gas.  Mild cramping or bloating in your belly. Follow these instructions at home: General instructions  For the first 24 hours after the procedure: ? Do not drive or use machinery. ? Do not sign important documents. ? Do not drink alcohol. ? Do your daily activities more slowly than normal. ? Eat foods that are soft and easy to digest.  Take over-the-counter or prescription medicines only as told by your doctor. To help cramping and bloating:   Try walking around.  Put heat on your belly (abdomen) as told by your doctor. Use a heat source that your doctor recommends, such as a moist heat pack or a heating pad. ? Put a towel between your skin and the heat source. ? Leave the heat on for 20-30 minutes. ? Remove the heat if your skin turns bright red. This is especially important if you cannot feel pain, heat, or cold. You can get burned. Eating and drinking   Drink enough fluid to keep your pee (urine) clear or pale yellow.  Return to your normal diet as told by your doctor. Avoid heavy or fried foods that are hard to digest.  Avoid drinking alcohol for as long as told by your doctor. Contact a doctor if:  You have blood in your poop (stool) 2-3 days after the procedure. Get help right away if:  You have more than a small amount of blood in your poop.  You see large clumps of tissue (blood clots) in your poop.  Your belly is swollen.  You feel sick to your stomach (nauseous).  You throw up (vomit).  You have a fever.  You have belly pain that gets worse, and medicine does not help your  pain. Summary  After the procedure, it is common to have a small amount of blood in your poop. You may also have mild cramping and bloating in your belly.  For the first 24 hours after the procedure, do not drive or use machinery, do not sign important documents, and do not drink alcohol.  Get help right away if you have a lot of blood in your poop, feel sick to your stomach, have a fever, or have more belly pain. This information is not intended to replace advice given to you by your health care provider. Make sure you discuss any questions you have with your health care provider. Document Released: 10/08/2010 Document Revised: 07/06/2017 Document Reviewed: 05/30/2016 Elsevier Interactive Patient Education  2019 [employer].

## 2018-09-05 NOTE — Anesthesia Preprocedure Evaluation (Addendum)
Anesthesia Evaluation  Patient identified by MRN, date of birth, ID band Patient awake    Reviewed: Allergy & Precautions, NPO status , Patient's Chart, lab work & pertinent test results, reviewed documented beta blocker date and time   History of Anesthesia Complications (+) PONV and history of anesthetic complications  Airway Mallampati: I  TM Distance: >3 FB Neck ROM: Full    Dental no notable dental hx.    Pulmonary sleep apnea and Continuous Positive Airway Pressure Ventilation , former smoker,    Pulmonary exam normal breath sounds clear to auscultation       Cardiovascular hypertension, Pt. on home beta blockers +CHF  Normal cardiovascular exam+ dysrhythmias Atrial Fibrillation  Rhythm:Regular Rate:Normal  ECG: Rate 85, A-fib, incomplete RBBB  ECHO: LV EF: 55% -   60%   Neuro/Psych negative neurological ROS  negative psych ROS   GI/Hepatic negative GI ROS, (+) Cirrhosis       , Hepatitis -Liver cirrhosis secondary to NASH (nonalcoholic steatohepatitis) Portal hypertension    Endo/Other  diabetes, Oral Hypoglycemic AgentsMorbid obesity ([place])  Renal/GU negative Renal ROS     Musculoskeletal negative musculoskeletal ROS (+)   Abdominal   Peds  Hematology HLD Thrombocytopenia    Anesthesia Other Findings History of colon polyps  Reproductive/Obstetrics                            Anesthesia Physical Anesthesia Plan  ASA: IV  Anesthesia Plan: MAC   Post-op Pain Management:    Induction: Intravenous  PONV Risk Score and Plan: 3 and Propofol infusion and Treatment may vary due to age or medical condition  Airway Management Planned: Nasal Cannula  Additional Equipment:   Intra-op Plan:   Post-operative Plan:   Informed Consent: I have reviewed the patients History and Physical, chart, labs and discussed the procedure including the risks, benefits and alternatives for  the proposed anesthesia with the patient or authorized representative who has indicated his/her understanding and acceptance.   Dental advisory given  Plan Discussed with: CRNA  Anesthesia Plan Comments:         Anesthesia Quick Evaluation

## 2018-09-05 NOTE — Op Note (Signed)
[facility] Patient Name: Hannah Khan Procedure Date: 09/05/2018 MRN: [id number] Attending MD: [name] , MD Date of Birth: 04/16/1956 CSN: [id number] Age: 62 Admit Type: Inpatient Procedure:                Colonoscopy Indications:              High risk colon cancer surveillance: Personal                            history of colonic polyps, Last colonoscopy: 2013 Providers:                [name], MD, Cleda Daub, RN, Alan Mulder, Technician Referring MD:              Medicines:                Monitored Anesthesia Care Complications:            Platelets 32k yesterday; given 2 units pheresed                            platelets (one infused immediately before, and the                            second infused during the procedure). Estimated Blood Loss:     Estimated blood loss: none. Procedure:                Pre-Anesthesia Assessment:                           - Prior to the procedure, a History and Physical                            was performed, and patient medications and                            allergies were reviewed. [name] tolerance of                            previous anesthesia was also reviewed. The risks                            and benefits of the procedure and the sedation                            options and risks were discussed with the patient.                            All questions were answered, and informed consent                            was obtained. Prior Anticoagulants: The patient has  taken no previous anticoagulant or antiplatelet                            agents. ASA Grade Assessment: IV - A patient with                            severe systemic disease that is a constant threat                            to life. After reviewing the risks and benefits,                            the patient was deemed in satisfactory condition to                   undergo the procedure.                           After obtaining informed consent, the colonoscope                            was passed under direct vision. Throughout the                            procedure, the patient's blood pressure, pulse, and                            oxygen saturations were monitored continuously. The                            [id number]-[id number] ([id number] Olympus peds colonscope was                            introduced through the anus and advanced to the the                            ileocolonic anastomosis. The rectum and Neoterminal                            ileum and ileocolic anastomosis were photographed.                            The colonoscopy was performed without difficulty.                            The patient tolerated the procedure well. The                            quality of the bowel preparation was good. Scope In: 11:42:45 AM Scope Out: 12:09:07 PM Scope Withdrawal Time: 0 hours 22 minutes 19 seconds  Total Procedure Duration: 0 hours 26 minutes 22 seconds  Findings:      Hemorrhoids were found on perianal exam.      A few medium-mouthed diverticula were found in the sigmoid colon and       descending  colon.      A 5 mm polyp was found in the descending colon. The polyp was sessile.       The polyp was removed with a hot snare. Resection and retrieval were       complete.      A 12 mm polyp was found in the descending colon. The polyp was       pedunculated. The polyp was removed with a hot snare. Resection and       retrieval were complete. To prevent bleeding after the polypectomy, one       hemostatic clip was successfully placed. There was no bleeding at the       end of the procedure.      Normal ileocolonic anastomosis      Colon otherwise normal; no other polyps, masses, vascular ectasias, or       inflammatory changes were seen.      Non-bleeding hemorrhoids were found during retroflexion. The hemorrhoids        were moderate.      No additional abnormalities were found on retroflexion. Impression:               - Hemorrhoids found on perianal exam.                           - Diverticulosis in the sigmoid colon and in the                            descending colon.                           - One 5 mm polyp in the descending colon, removed                            with a hot snare. Resected and retrieved.                           - One 12 mm polyp in the descending colon, removed                            with a hot snare. Resected and retrieved. Clip was                            placed prophylactically to decrease risk of                            post-polypectomy bleeding.                           - Non-bleeding hemorrhoids.                           - The examination was otherwise normal. Moderate Sedation:      None Recommendation:           - Patient has a contact number available for                            emergencies. The signs and symptoms of  potential                            delayed complications were discussed with the                            patient. Return to normal activities tomorrow.                            Written discharge instructions were provided to the                            patient.                           - Discharge patient to home (via wheelchair).                           - High fiber diet indefinitely.                           - Continue present medications.                           - Await pathology results.                           - Repeat colonoscopy (date not yet determined) for                            surveillance based on pathology results.                           - Return to GI clinic in 6 months.                           - Return to referring physician as previously                            scheduled. Procedure Code(s):        --- Professional ---                           [phone], Colonoscopy, flexible; with removal of                             tumor(s), polyp(s), or other lesion(s) by snare                            technique Diagnosis Code(s):        --- Professional ---                           Z86.010, Personal history of colonic polyps                           K64.9, Unspecified hemorrhoids  D12.4, Benign neoplasm of descending colon                           K57.30, Diverticulosis of large intestine without                            perforation or abscess without bleeding CPT copyright 2018 [employer]. All rights reserved. The codes documented in this report are preliminary and upon coder review may  be revised to meet current compliance requirements. [name], MD 09/05/2018 12:17:49 PM This report has been signed electronically. Number of Addenda: 0

## 2018-09-05 NOTE — Anesthesia Postprocedure Evaluation (Signed)
Anesthesia Post Note  Patient: Hannah Khan  Procedure(s) Performed: COLONOSCOPY (N/A ) [place]     Patient location during evaluation: PACU Anesthesia Type: MAC Level of consciousness: awake and alert Pain management: pain level controlled Vital Signs Assessment: post-procedure vital signs reviewed and stable Respiratory status: spontaneous breathing, nonlabored ventilation, respiratory function stable and patient connected to nasal cannula oxygen Cardiovascular status: stable and blood pressure returned to baseline Postop Assessment: no apparent nausea or vomiting Anesthetic complications: no    Last Vitals:  Vitals:   09/05/18 1220 09/05/18 1232  BP: (!) 138/55 (!) 117/59  Pulse: 98 94  Resp: 15 19  Temp:    SpO2: 100% 90%    Last Pain:  Vitals:   09/05/18 1232  TempSrc:   PainSc: 0-No pain                 Kathlyn Leachman P Eleftheria Taborn

## 2018-09-06 ENCOUNTER — Encounter (HOSPITAL_COMMUNITY): Payer: Self-pay | Admitting: Gastroenterology

## 2018-09-06 LAB — PREPARE PLATELET PHERESIS
Unit division: 0
Unit division: 0

## 2018-09-06 LAB — BPAM PLATELET PHERESIS
Blood Product Expiration Date: 201912182359
Blood Product Expiration Date: 201912182359
ISSUE DATE / TIME: 201912180751
ISSUE DATE / TIME: 201912181054
Unit Type and Rh: 7300
Unit Type and Rh: 7300

## 2018-09-07 ENCOUNTER — Observation Stay (HOSPITAL_COMMUNITY)
Admission: EM | Admit: 2018-09-07 | Discharge: 2018-09-08 | Disposition: A | Discharge status: Home / Self Care | Payer: BC Managed Care – PPO | Attending: Internal Medicine | Admitting: Internal Medicine

## 2018-09-07 ENCOUNTER — Emergency Department (HOSPITAL_COMMUNITY): Payer: BC Managed Care – PPO

## 2018-09-07 DIAGNOSIS — K7581 Nonalcoholic steatohepatitis (NASH): Secondary | ICD-10-CM | POA: Insufficient documentation

## 2018-09-07 DIAGNOSIS — G4733 Obstructive sleep apnea (adult) (pediatric): Secondary | ICD-10-CM | POA: Diagnosis present

## 2018-09-07 DIAGNOSIS — D61818 Other pancytopenia: Secondary | ICD-10-CM | POA: Diagnosis not present

## 2018-09-07 DIAGNOSIS — I5033 Acute on chronic diastolic (congestive) heart failure: Secondary | ICD-10-CM | POA: Diagnosis present

## 2018-09-07 DIAGNOSIS — K766 Portal hypertension: Secondary | ICD-10-CM | POA: Diagnosis not present

## 2018-09-07 DIAGNOSIS — Z886 Allergy status to analgesic agent status: Secondary | ICD-10-CM | POA: Insufficient documentation

## 2018-09-07 DIAGNOSIS — J90 Pleural effusion, not elsewhere classified: Secondary | ICD-10-CM | POA: Insufficient documentation

## 2018-09-07 DIAGNOSIS — I10 Essential (primary) hypertension: Secondary | ICD-10-CM | POA: Diagnosis present

## 2018-09-07 DIAGNOSIS — Z885 Allergy status to narcotic agent status: Secondary | ICD-10-CM | POA: Insufficient documentation

## 2018-09-07 DIAGNOSIS — Z791 Long term (current) use of non-steroidal anti-inflammatories (NSAID): Secondary | ICD-10-CM | POA: Diagnosis not present

## 2018-09-07 DIAGNOSIS — I5043 Acute on chronic combined systolic (congestive) and diastolic (congestive) heart failure: Secondary | ICD-10-CM | POA: Insufficient documentation

## 2018-09-07 DIAGNOSIS — Z888 Allergy status to other drugs, medicaments and biological substances status: Secondary | ICD-10-CM | POA: Insufficient documentation

## 2018-09-07 DIAGNOSIS — R112 Nausea with vomiting, unspecified: Secondary | ICD-10-CM | POA: Insufficient documentation

## 2018-09-07 DIAGNOSIS — I4821 Permanent atrial fibrillation: Principal | ICD-10-CM | POA: Insufficient documentation

## 2018-09-07 DIAGNOSIS — D6959 Other secondary thrombocytopenia: Secondary | ICD-10-CM | POA: Diagnosis not present

## 2018-09-07 DIAGNOSIS — E119 Type 2 diabetes mellitus without complications: Secondary | ICD-10-CM

## 2018-09-07 DIAGNOSIS — K7460 Unspecified cirrhosis of liver: Secondary | ICD-10-CM | POA: Diagnosis present

## 2018-09-07 DIAGNOSIS — D649 Anemia, unspecified: Secondary | ICD-10-CM | POA: Diagnosis present

## 2018-09-07 DIAGNOSIS — I110 Hypertensive heart disease with heart failure: Secondary | ICD-10-CM | POA: Diagnosis not present

## 2018-09-07 DIAGNOSIS — I4891 Unspecified atrial fibrillation: Secondary | ICD-10-CM | POA: Diagnosis present

## 2018-09-07 DIAGNOSIS — K7469 Other cirrhosis of liver: Secondary | ICD-10-CM | POA: Diagnosis present

## 2018-09-07 DIAGNOSIS — I5032 Chronic diastolic (congestive) heart failure: Secondary | ICD-10-CM | POA: Diagnosis present

## 2018-09-07 DIAGNOSIS — D696 Thrombocytopenia, unspecified: Secondary | ICD-10-CM | POA: Diagnosis present

## 2018-09-07 DIAGNOSIS — Z79899 Other long term (current) drug therapy: Secondary | ICD-10-CM | POA: Insufficient documentation

## 2018-09-07 LAB — I-STAT TROPONIN, ED: Troponin i, poc: 0 ng/mL (ref 0.00–0.08)

## 2018-09-07 LAB — HEPATIC FUNCTION PANEL
ALK PHOS: 70 U/L (ref 38–126)
ALT: 26 U/L (ref 0–44)
AST: 48 U/L — ABNORMAL HIGH (ref 15–41)
Albumin: 3.3 g/dL — ABNORMAL LOW (ref 3.5–5.0)
Bilirubin, Direct: 0.7 mg/dL — ABNORMAL HIGH (ref 0.0–0.2)
Indirect Bilirubin: 4.8 mg/dL — ABNORMAL HIGH (ref 0.3–0.9)
Total Bilirubin: 5.5 mg/dL — ABNORMAL HIGH (ref 0.3–1.2)
Total Protein: 6.4 g/dL — ABNORMAL LOW (ref 6.5–8.1)

## 2018-09-07 LAB — DIFFERENTIAL
BASOS PCT: 0 %
Basophils Absolute: 0 10*3/uL (ref 0.0–0.1)
Eosinophils Absolute: 0.1 10*3/uL (ref 0.0–0.5)
Eosinophils Relative: 2 %
Lymphocytes Relative: 10 %
Lymphs Abs: 0.4 10*3/uL — ABNORMAL LOW (ref 0.7–4.0)
Monocytes Absolute: 0.5 10*3/uL (ref 0.1–1.0)
Monocytes Relative: 11 %
Neutro Abs: 3.4 10*3/uL (ref 1.7–7.7)
Neutrophils Relative %: 78 %

## 2018-09-07 LAB — CBC
HCT: 37.7 % (ref 36.0–46.0)
HEMOGLOBIN: 12.4 g/dL (ref 12.0–15.0)
MCH: 30.7 pg (ref 26.0–34.0)
MCHC: 32.9 g/dL (ref 30.0–36.0)
MCV: 93.3 fL (ref 80.0–100.0)
Platelets: 39 10*3/uL — ABNORMAL LOW (ref 150–400)
RBC: 4.04 MIL/uL (ref 3.87–5.11)
RDW: 15.9 % — ABNORMAL HIGH (ref 11.5–15.5)
WBC: 4.4 10*3/uL (ref 4.0–10.5)
nRBC: 0 % (ref 0.0–0.2)

## 2018-09-07 LAB — BASIC METABOLIC PANEL
ANION GAP: 13 (ref 5–15)
BUN: 16 mg/dL (ref 8–23)
CO2: 18 mmol/L — ABNORMAL LOW (ref 22–32)
Calcium: 8.7 mg/dL — ABNORMAL LOW (ref 8.9–10.3)
Chloride: 106 mmol/L (ref 98–111)
Creatinine, Ser: 0.71 mg/dL (ref 0.44–1.00)
GFR calc Af Amer: >60 mL/min (ref >60)
GFR calc non Af Amer: >60 mL/min (ref >60)
GLUCOSE: 126 mg/dL — AB (ref 70–99)
Potassium: 4 mmol/L (ref 3.5–5.1)
Sodium: 137 mmol/L (ref 135–145)

## 2018-09-07 LAB — LIPASE, BLOOD: LIPASE: 22 U/L (ref 11–51)

## 2018-09-07 LAB — PROTIME-INR
INR: 1.21
Prothrombin Time: 15.2 seconds (ref 11.4–15.2)

## 2018-09-07 LAB — BRAIN NATRIURETIC PEPTIDE: B Natriuretic Peptide: 266 pg/mL — ABNORMAL HIGH (ref 0.0–100.0)

## 2018-09-07 MED ORDER — PANTOPRAZOLE SODIUM 40 MG IV SOLR
40.0000 mg | Freq: Two times a day (BID) | INTRAVENOUS | Status: DC
  Administered 2018-09-07 – 2018-09-08 (×2): 40 mg via INTRAVENOUS
  Filled 2018-09-07 (×2): qty 40

## 2018-09-07 MED ORDER — ONDANSETRON HCL 4 MG/2ML IJ SOLN: 4.0000 mg | Freq: Four times a day (QID) | INTRAMUSCULAR | Status: DC | PRN

## 2018-09-07 MED ORDER — DILTIAZEM LOAD VIA INFUSION
10.0000 mg | Freq: Once | INTRAVENOUS | Status: AC
  Administered 2018-09-07: 10 mg via INTRAVENOUS
  Filled 2018-09-07: qty 10

## 2018-09-07 MED ORDER — ONDANSETRON HCL 4 MG PO TABS: 4.0000 mg | ORAL_TABLET | Freq: Four times a day (QID) | ORAL | Status: DC | PRN

## 2018-09-07 MED ORDER — PROPRANOLOL HCL 10 MG PO TABS
10.0000 mg | ORAL_TABLET | Freq: Two times a day (BID) | ORAL | Status: DC
  Administered 2018-09-08: 10 mg via ORAL
  Filled 2018-09-07 (×2): qty 1

## 2018-09-07 MED ORDER — CYCLOBENZAPRINE HCL 10 MG PO TABS
10.0000 mg | ORAL_TABLET | Freq: Two times a day (BID) | ORAL | Status: DC | PRN
  Administered 2018-09-08: 10 mg via ORAL
  Filled 2018-09-07: qty 1

## 2018-09-07 MED ORDER — CALCIUM CARBONATE 1250 (500 CA) MG PO TABS
1.0000 | ORAL_TABLET | Freq: Every day | ORAL | Status: DC
  Administered 2018-09-08: 500 mg via ORAL
  Filled 2018-09-07 (×2): qty 1

## 2018-09-07 MED ORDER — DILTIAZEM HCL-DEXTROSE 100-5 MG/100ML-% IV SOLN (PREMIX)
5.0000 mg/h | INTRAVENOUS | Status: DC
  Administered 2018-09-07: 5 mg/h via INTRAVENOUS
  Administered 2018-09-07: 15 mg/h via INTRAVENOUS
  Filled 2018-09-07 (×3): qty 100

## 2018-09-07 MED ORDER — OXYCODONE HCL 5 MG PO TABS: 5.0000 mg | ORAL_TABLET | Freq: Four times a day (QID) | ORAL | Status: DC | PRN

## 2018-09-07 NOTE — ED Notes (Signed)
ED TO INPATIENT HANDOFF REPORT  Name/Age/Gender Hannah Khan 62 y.o. female  Code Status Code Status History    Date Active Date Inactive Code Status Order ID Comments User Context   04/26/2018 1502 04/29/2018 1740 Full Code [id number]  Reubin Milan, MD ED   03/15/2018 0325 03/23/2018 1548 Full Code [id number]  Etta Quill, DO Inpatient   03/15/2018 0245 03/15/2018 0325 Full Code [id number]  Reyne Dumas, MD Inpatient   10/31/2016 2201 11/02/2016 1849 Full Code [id number]  Edwin Dada, MD Inpatient   07/10/2016 1702 07/11/2016 2001 Full Code [id number]  Debbe Odea, MD ED   01/26/2016 1720 01/30/2016 1756 Full Code [id number]  Florinda Marker, MD ED   12/08/2015 2120 12/12/2015 1651 Full Code [id number]  Elgergawy, Silver Huguenin, MD Inpatient   01/31/2015 0243 02/01/2015 1559 Full Code [id number]  Lavina Hamman, MD ED   07/23/2013 1928 07/24/2013 1704 Full Code [id number]  Modena Jansky, MD ED      Home/SNF/Other Home  Chief Complaint AFIB   Level of Care/Admitting Diagnosis ED Disposition    ED Disposition Condition [place]: [facility] [100102]  Level of Care: Stepdown [14]  Admit to SDU based on following criteria: Hemodynamic compromise or significant risk of instability:  Patient requiring short term acute titration and management of vasoactive drips, and invasive monitoring (i.e., CVP and Arterial line).  Diagnosis: Atrial fibrillation with RVR [facility]) [903833]  Admitting Physician: Elmarie Shiley [phone]  Attending Physician: Niel Hummer A [[id number]]  PT Class (Do Not Modify): Observation [104]  PT Acc Code (Do Not Modify): Observation [10022]       Medical History Past Medical History:  Diagnosis Date  . Anemia    hx of  . Arthritis    knees  . Diabetes mellitus without complication ([place])    type 2  . Fibromyalgia   . HTN (hypertension)   . Hx of colonic polyps    s/p partial colectomy  . Hyperlipidemia    . Joint pain    in knees and back spasms  . Left leg swelling    wear compression hose  . Liver cirrhosis secondary to NASH (nonalcoholic steatohepatitis) ([place]) dx nov 2014   had enlarged spleen also   . Low blood pressure    bp varies  . Morbid obesity ([place])   . Persistent atrial fibrillation   . Pneumonia 10/2016  . PONV (postoperative nausea and vomiting)    in past none recent  . Portal hypertension ([place])   . Sleep apnea    uses cpap setting of 14  . Spleen enlarged   . 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 Celecoxib Other (See Comments)    "speech slurred" with celebrex  . Shellfish Allergy Swelling and Rash    TINGLING AND SWELLING OF LIPS. LARGE WHELPS QUARTER-SIZE  . Barium-Containing Compounds Other (See Comments)    TACHYCARDIA  . Prednisone Other (See Comments)    TACHYCARDIA  . Statins Other (See Comments)    AGGRAVATED FIBROMYALGIA  . Fentanyl     Hallucinations   . Ibuprofen Other (See Comments)    UNSPECIFIED SPECIFIC REACTION >> "DUE TO PLATELETS"  . Tramadol     Hallucination   . Tylenol [Acetaminophen] Other (See Comments)    UNSPECIFIED SPECIFIC REACTION >> "DUE TO PLATELETS"    IV Location/Drains/Wounds Patient Lines/Drains/Airways Status   Active Line/Drains/Airways  Name:   Placement date:   Placement time:   Site:   Days:   Peripheral IV 09/07/18 Left Hand   09/07/18    1512    Hand   less than 1   Peripheral IV 09/07/18 Left Wrist   09/07/18    1753    Wrist   less than 1          Labs/Imaging Results for orders placed or performed during the hospital encounter of 09/07/18 (from the past 48 hour(s))  Basic metabolic panel     Status: Abnormal   Collection Time: 09/07/18  3:16 PM  Result Value Ref Range   Sodium 137 135 - 145 mmol/L   Potassium 4.0 3.5 - 5.1 mmol/L   Chloride 106 98 - 111 mmol/L   CO2 18 (L) 22 - 32 mmol/L   Glucose, Bld 126 (H) 70 - 99 mg/dL   BUN 16 8  - 23 mg/dL   Creatinine, Ser 0.71 0.44 - 1.00 mg/dL   Calcium 8.7 (L) 8.9 - 10.3 mg/dL   GFR calc non Af Amer >60 >60 mL/min   GFR calc Af Amer >60 >60 mL/min   Anion gap 13 5 - 15    Comment: Performed at [facility], [place] [address], [place], [place] [address]  CBC     Status: Abnormal   Collection Time: 09/07/18  3:16 PM  Result Value Ref Range   WBC 4.4 4.0 - 10.5 K/uL   RBC 4.04 3.87 - 5.11 MIL/uL   Hemoglobin 12.4 12.0 - 15.0 g/dL   HCT 37.7 36.0 - 46.0 %   MCV 93.3 80.0 - 100.0 fL   MCH 30.7 26.0 - 34.0 pg   MCHC 32.9 30.0 - 36.0 g/dL   RDW 15.9 (H) 11.5 - 15.5 %   Platelets 39 (L) 150 - 400 K/uL    Comment: REPEATED TO VERIFY PLATELET COUNT CONFIRMED BY SMEAR SPECIMEN CHECKED FOR CLOTS Immature Platelet Fraction may be clinically indicated, consider ordering this additional test [id number]    nRBC 0.0 0.0 - 0.2 %    Comment: Performed at [facility], [place] [address] [place], [place] [address]  Brain natriuretic peptide     Status: Abnormal   Collection Time: 09/07/18  3:16 PM  Result Value Ref Range   B Natriuretic Peptide 266.0 (H) 0.0 - 100.0 pg/mL    Comment: Performed at [facility], [place] [address], [place], [place] [address]  Hepatic function panel     Status: Abnormal   Collection Time: 09/07/18  3:16 PM  Result Value Ref Range   Total Protein 6.4 (L) 6.5 - 8.1 g/dL   Albumin 3.3 (L) 3.5 - 5.0 g/dL   AST 48 (H) 15 - 41 U/L   ALT 26 0 - 44 U/L   Alkaline Phosphatase 70 38 - 126 U/L   Total Bilirubin 5.5 (H) 0.3 - 1.2 mg/dL   Bilirubin, Direct 0.7 (H) 0.0 - 0.2 mg/dL   Indirect Bilirubin 4.8 (H) 0.3 - 0.9 mg/dL    Comment: Performed at [facility], [place] [address], [place], [place] [address]  Lipase, blood     Status: None   Collection Time: 09/07/18  3:16 PM  Result Value Ref Range   Lipase 22 11 - 51 U/L    Comment: Performed at [facility], [place] [address], [place],  [address]  Protime-INR     Status: None   Collection  Time: 09/07/18  3:16 PM  Result Value Ref Range   Prothrombin Time 15.2 11.4 - 15.2 seconds   INR 1.21     Comment: Performed at [facility], [place] [address] [place], [place] [address]  Differential     Status: Abnormal   Collection Time: 09/07/18  3:16 PM  Result Value Ref Range   Neutrophils Relative % 78 %   Neutro Abs 3.4 1.7 - 7.7 K/uL   Lymphocytes Relative 10 %   Lymphs Abs 0.4 (L) 0.7 - 4.0 K/uL   Monocytes Relative 11 %   Monocytes Absolute 0.5 0.1 - 1.0 K/uL   Eosinophils Relative 2 %   Eosinophils Absolute 0.1 0.0 - 0.5 K/uL   Basophils Relative 0 %   Basophils Absolute 0.0 0.0 - 0.1 K/uL    Comment: Performed at [facility], [place] [address] [place], [place] [address]  I-stat troponin, ED     Status: None   Collection Time: 09/07/18  3:27 PM  Result Value Ref Range   Troponin i, poc 0.00 0.00 - 0.08 ng/mL   Comment 3            Comment: Due to the release kinetics of cTnI, a negative result within the first hours of the onset of symptoms does not rule out myocardial infarction with certainty. If myocardial infarction is still suspected, repeat the test at appropriate intervals.    Dg Chest 2 View  Result Date: 09/07/2018 CLINICAL DATA:  Short of breath hypertension EXAM: CHEST - 2 VIEW COMPARISON:  04/26/2018 FINDINGS: Cardiac enlargement with mild vascular congestion. Negative for edema or effusion. Mild bibasilar atelectasis. Negative for pneumonia. IMPRESSION: Cardiac enlargement with mild vascular congestion. Negative for edema. Electronically Signed   By: Franchot Gallo M.D.   On: 09/07/2018 15:38   EKG Interpretation  Date/Time:  Friday September 07 2018 15:22:01 EST Ventricular Rate:  120 PR Interval:    QRS Duration: 95 QT Interval:  307 QTC Calculation: 434 R Axis:   -35 Text Interpretation:  Atrial fibrillation with rapid ventricular  response Left axis deviation Anteroseptal infarct, age indeterminate Baseline wander When compared with ECG of 04/26/2018 No significant change was found Confirmed by Francine Graven [phone]) on 09/07/2018 3:41:11 PM   Pending Labs Unresulted Labs (From admission, onward)    Start     Ordered   09/08/18 0500  Comprehensive metabolic panel  Tomorrow morning,   R     09/07/18 1835   09/08/18 0500  CBC  Tomorrow morning,   R     09/07/18 1836   Signed and Held  Basic metabolic panel  Tomorrow morning,   R     Signed and Held   Signed and Held  CBC  Tomorrow morning,   R     Signed and Held          Vitals/Pain Today's Vitals   09/07/18 1839 09/07/18 1845 09/07/18 1900 09/07/18 1945  BP:  (!) 113/55 (!) 110/52 (!) 117/57  Pulse: (!) 102 100 98 (!) 104  Resp:  18 18 19   Temp:      TempSrc:      SpO2: 99% 97% 97% 97%    Isolation Precautions No active isolations  Medications Medications  diltiazem (CARDIZEM) 1 mg/mL load via infusion 10 mg (10 mg Intravenous Bolus from Bag 09/07/18 1619)    And  diltiazem (CARDIZEM) 100 mg in dextrose 5% 156m (1 mg/mL) infusion (9 mg/hr Intravenous Rate/Dose Change 09/07/18  1741)  pantoprazole (PROTONIX) injection 40 mg (has no administration in time range)    Mobility walks

## 2018-09-07 NOTE — ED Notes (Signed)
Bed: WA08 Expected date:  Expected time:  Means of arrival:  Comments: EMS-N/V-A-Fib

## 2018-09-07 NOTE — H&P (Addendum)
History and Physical    Hannah Khan ERD:[id number] DOB: 09-27-1955 DOA: 09/07/2018  PCP: Lujean Amel, MD  Patient coming from: Home   I have personally briefly reviewed patient's old medical records in [place]  Chief Complaint: nausea, vomiting, palpitation.   HPI: Hannah Khan is a 62 y.o. female with medical history significant of past medical history of hypertension, anemia, diabetes, chronic thrombocytopenia related to liver failure, Karlene Lineman cirrhosis 0 of A. fib not on anticoagulation due to thrombocytopenia and increased risk for bleeding. Presents today complaining of shortness of breath, palpitation, chest discomfort.  Reports that after the colonoscopy she daily after developed nausea, headache.  She also reports that the night prior to admission she vomited, multiple times.  She also vomited this morning.  Was able to keep down her Cardizem.  This morning she checked her blood pressure and her heart rate and her heart rate was in the 160 range.  After she took her medication her heart rate decreased to 135.  Still feeling short of breath and with the palpitation.  She was transferred to the ED by EMS.  She received adenosine by EMS provider.  She felt like her heart stop after the adenosine and she felt sick.  He is feeling better now.  ED Course: Patient was found to be in A. fib RVR.  Started on Cardizem drip.  BNP mildly elevated at 266, troponins negative, CO2 at 18, BUN at 16, bilirubin at 5.5 , unlikely elevated.  Direct is elevated at 4. Chest x-ray with mild vascular congestion.  Review of Systems: negative except as per HPI.   Past Medical History:  Diagnosis Date  . Anemia    hx of  . Arthritis    knees  . Diabetes mellitus without complication ([place])    type 2  . Fibromyalgia   . HTN (hypertension)   . Hx of colonic polyps    s/p partial colectomy  . Hyperlipidemia   . Joint pain    in knees and back spasms  . Left leg swelling    wear  compression hose  . Liver cirrhosis secondary to NASH (nonalcoholic steatohepatitis) ([place]) dx nov 2014   had enlarged spleen also   . Low blood pressure    bp varies  . Morbid obesity ([place])   . Persistent atrial fibrillation   . Pneumonia 10/2016  . PONV (postoperative nausea and vomiting)    in past none recent  . Portal hypertension ([place])   . Sleep apnea    uses cpap setting of 14  . Spleen enlarged   . 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 [facility])     Past Surgical History:  Procedure Laterality Date  . BREAST LUMPECTOMY WITH RADIOACTIVE SEED LOCALIZATION Right 08/29/2017   Procedure: RIGHT BREAST LUMPECTOMY WITH RADIOACTIVE SEEDS LOCALIZATION;  Surgeon: Erroll Luna, MD;  Location: [facility];  Service: General;  Laterality: Right;  . CESAREAN SECTION  1989  . CHOLECYSTECTOMY  20 yrs ago  . COLECTOMY  2011  . COLONOSCOPY    . COLONOSCOPY N/A 09/05/2018   Procedure: COLONOSCOPY;  Surgeon: [name], MD;  Location: [facility];  Service: Endoscopy;  Laterality: N/A;  . DILATATION & CURETTAGE/HYSTEROSCOPY WITH MYOSURE N/A 05/06/2016   Procedure: DILATATION & CURETTAGE/HYSTEROSCOPY WITH MYOSURE WITH POLYPECTOMY;  Surgeon: Janyth Pupa, DO;  Location: [facility];  Service: Gynecology;  Laterality: N/A;  . ESOPHAGOGASTRODUODENOSCOPY (EGD) WITH PROPOFOL N/A 09/04/2013   Procedure: ESOPHAGOGASTRODUODENOSCOPY (EGD) WITH PROPOFOL;  Surgeon: [name], MD;  Location: [facility];  Service: Endoscopy;  Laterality: N/A;  . KNEE ARTHROSCOPY  yrs ago   bilateral, one done x 1, one done twice  . POLYPECTOMY  09/05/2018   Procedure: POLYPECTOMY;  Surgeon: [name], MD;  Location: [facility];  Service: Endoscopy;;     reports that she quit smoking about 43 years ago. Her smoking use included cigarettes. She has a 3.00 pack-year smoking history. She has never used smokeless tobacco. She reports current alcohol use. She reports that she does not use drugs.  Allergies  Allergen  Reactions  . Celecoxib Other (See Comments)    "speech slurred" with celebrex  . Shellfish Allergy Swelling and Rash    TINGLING AND SWELLING OF LIPS. LARGE WHELPS QUARTER-SIZE  . Barium-Containing Compounds Other (See Comments)    TACHYCARDIA  . Prednisone Other (See Comments)    TACHYCARDIA  . Statins Other (See Comments)    AGGRAVATED FIBROMYALGIA  . Fentanyl     Hallucinations   . Ibuprofen Other (See Comments)    UNSPECIFIED SPECIFIC REACTION >> "DUE TO PLATELETS"  . Tramadol     Hallucination   . Tylenol [Acetaminophen] Other (See Comments)    UNSPECIFIED SPECIFIC REACTION >> "DUE TO PLATELETS"    Family History  Problem Relation Age of Onset  . Heart failure Mother   . Cancer Mother   . Hyperlipidemia Mother   . Congestive Heart Failure Mother   . Stroke Mother   . Lung cancer Father   . Cancer Father   . Cancer Brother   . Hyperlipidemia Brother   . Stroke Other     Prior to Admission medications   Medication Sig Start Date End Date Taking? Authorizing Provider  cyclobenzaprine (FLEXERIL) 10 MG tablet Take 10 mg by mouth 2 (two) times daily as needed for muscle spasms.    Yes [provider]  diltiazem (CARDIZEM CD) 300 MG 24 hr capsule TAKE 1 CAPSULE BY MOUTH EVERY DAY Patient taking differently: Take 300 mg by mouth daily. TAKE 1 CAPSULE BY MOUTH EVERY DAY 06/22/18  Yes Skeet Latch, MD  ibuprofen (ADVIL,MOTRIN) 200 MG tablet Take 400 mg by mouth every 6 (six) hours as needed for moderate pain.   Yes [provider]  JARDIANCE 25 MG TABS tablet Take 25 mg by mouth daily after breakfast.  01/28/17  Yes [provider]  KLOR-CON M20 20 MEQ tablet Take 1 tablet (20 mEq total) by mouth daily. 03/27/18  Yes Barrett, Evelene Croon, PA-C  propranolol (INDERAL) 10 MG tablet Take 1 tablet (10 mg total) by mouth 2 (two) times daily. 09/05/18  Yes Skeet Latch, MD  spironolactone (ALDACTONE) 25 MG tablet Take 25 mg by mouth daily after  breakfast.  07/24/13  Yes Thurnell Lose, MD  bumetanide (BUMEX) 2 MG tablet Take 2-3 mg by mouth 2 (two) times daily as needed (fluid).     [provider]  calcium carbonate (OS-CAL - DOSED IN MG OF ELEMENTAL CALCIUM) 1250 (500 Ca) MG tablet Take 1 tablet by mouth daily with breakfast.     [provider]    Physical Exam: Vitals:   09/07/18 1715 09/07/18 1730 09/07/18 1745 09/07/18 1800  BP: (!) 136/56 (!) 144/114 (!) 97/52 (!) 109/45  Pulse: (!) 110 100 (!) 104 (!) 102  Resp: (!) 22 (!) 24 18 20   Temp:      TempSrc:      SpO2: 96% 98% 98% 99%   Eyes: PERRL, lids and conjunctivae normal,  morbid obese.  ENMT: Mucous membranes are moist. Posterior pharynx clear of any exudate or lesions.Normal dentition.  Neck: normal, supple, no masses, no thyromegaly Respiratory: decreased breath sound, no crackles.  Cardiovascular: Regular rate and rhythm, no murmurs / rubs / gallops. No extremity edema. 2+ pedal pulses. No carotid bruits.  Abdomen: no tenderness, no masses palpated. No hepatosplenomegaly. Bowel sounds positive.  Musculoskeletal: no clubbing / cyanosis. No joint deformity upper and lower extremities. Good ROM, no contractures. Normal muscle tone.  Skin: no rashes, lesions, ulcers. No induration Neurologic: non focal.  Psychiatric: Normal judgment and insight. Alert and oriented x 3. Normal mood.    Labs on Admission: I have personally reviewed following labs and imaging studies  CBC: Recent Labs  Lab 09/05/18 1019 09/05/18 1025 09/07/18 1516  WBC 4.7  --  4.4  NEUTROABS  --   --  3.4  HGB 12.3 11.6* 12.4  HCT 38.3 34.0* 37.7  MCV 95.8  --  93.3  PLT 40*  --  39*   Basic Metabolic Panel: Recent Labs  Lab 09/05/18 1025 09/07/18 1516  NA 141 137  K 3.9 4.0  CL  --  106  CO2  --  18*  GLUCOSE 119* 126*  BUN  --  16  CREATININE  --  0.71  CALCIUM  --  8.7*   GFR: Estimated Creatinine Clearance: 117.8 mL/min (by C-G formula based on SCr of  0.71 mg/dL). Liver Function Tests: Recent Labs  Lab 09/07/18 1516  AST 48*  ALT 26  ALKPHOS 70  BILITOT 5.5*  PROT 6.4*  ALBUMIN 3.3*   Recent Labs  Lab 09/07/18 1516  LIPASE 22   No results for input(s): AMMONIA in the last 168 hours. Coagulation Profile: Recent Labs  Lab 09/07/18 1516  INR 1.21   Cardiac Enzymes: No results for input(s): CKTOTAL, CKMB, CKMBINDEX, TROPONINI in the last 168 hours. BNP (last 3 results) Recent Labs    04/25/18 0815 06/08/18 0955  PROBNP 305* 253   HbA1C: No results for input(s): HGBA1C in the last 72 hours. CBG: Recent Labs  Lab 09/05/18 0727  GLUCAP 141*   Lipid Profile: No results for input(s): CHOL, HDL, LDLCALC, TRIG, CHOLHDL, LDLDIRECT in the last 72 hours. Thyroid Function Tests: No results for input(s): TSH, T4TOTAL, FREET4, T3FREE, THYROIDAB in the last 72 hours. Anemia Panel: No results for input(s): VITAMINB12, FOLATE, FERRITIN, TIBC, IRON, RETICCTPCT in the last 72 hours. Urine analysis:    Component Value Date/Time   COLORURINE YELLOW 04/26/2018 [place] 04/26/2018 1520   LABSPEC 1.028 04/26/2018 1520   PHURINE 6.0 04/26/2018 1520   GLUCOSEU >=500 (A) 04/26/2018 1520   HGBUR MODERATE (A) 04/26/2018 1520   BILIRUBINUR NEGATIVE 04/26/2018 1520   KETONESUR 5 (A) 04/26/2018 1520   PROTEINUR NEGATIVE 04/26/2018 1520   UROBILINOGEN 2.0 (H) 01/31/2015 0055   NITRITE POSITIVE (A) 04/26/2018 1520   LEUKOCYTESUR NEGATIVE 04/26/2018 1520    Radiological Exams on Admission: Dg Chest 2 View  Result Date: 09/07/2018 CLINICAL DATA:  Short of breath hypertension EXAM: CHEST - 2 VIEW COMPARISON:  04/26/2018 FINDINGS: Cardiac enlargement with mild vascular congestion. Negative for edema or effusion. Mild bibasilar atelectasis. Negative for pneumonia. IMPRESSION: Cardiac enlargement with mild vascular congestion. Negative for edema. Electronically Signed   By: Franchot Gallo M.D.   On: 09/07/2018 15:38     EKG: Independently reviewed.  A. fib RVR  Assessment/Plan Active Problems:   Obstructive sleep apnea   Essential hypertension  Cirrhosis of liver, with portal HTN & Acites, by CT Abdomen 07/20/13   Pancytopenia ([place])   Type 2 diabetes mellitus without complication, without long-term current use of insulin (H[facility])   Chronic diastolic CHF (congestive heart failure) ([facility])   Thrombocytopenia ([facility])   Atrial fibrillation with RVR ([facility])   Liver cirrhosis secondary to NASH (nonalcoholic steatohepatitis) ([facility])  1-A. fib RVR; Suspect related to dehydration. Started on IV Cardizem drip Will hold on a start IV fluids to avoid pulmonary edema, could consider IV bolus if hypotension. Might need cardiology consult in the morning.  2-nausea vomiting; Might be related to acute viral gastroenteritis. IV Protonix. PRN Zofran Last episode of vomiting earlier this morning.  She has been able to drink sips of fluids in the ED>   3-Thrombocytopenia; chronic. Monitor.   4-Bilirubinemia;  Prior level at 5.  Will repeat bilirubin fraction. Monitor for hemolysis. Recent platelet transfusion. Monitor for transfusion reaction. Platelet transfusion was on Wednesday.    5-[place] , cirrhosis; hold spironolactone and Bumex to avoid further dehydration.  6-OSA; CPAP.   DVT prophylaxis: scd.  Code Status: full code.  Family Communication: Husband at bedside Disposition Plan: home when HR controlled.  Consults called: none Admission status: observation step down unit    Elmarie Shiley MD [facility] Pager [phone]  If 7PM-7AM, please contact night-coverage [email or web address] Password [email or web address]  09/07/2018, 6:18 PM

## 2018-09-07 NOTE — ED Notes (Signed)
Report called to 2w

## 2018-09-07 NOTE — ED Notes (Signed)
Admission provider at bedside speaking with patient.

## 2018-09-07 NOTE — ED Triage Notes (Signed)
Transported by [facility] from home-- history of a-fib, + shortness of breath and fatigue. EMS administered Adenosine (6 mg and 12 mg), Zofran (46m), and Metoprolol (523m PTA.

## 2018-09-07 NOTE — ED Provider Notes (Signed)
[place] DEPT Provider Note   CSN: [id number] Arrival date & time: 09/07/18  1451     History   Chief Complaint Chief Complaint  Patient presents with  . Palpitations    HPI Hannah Khan is a 62 y.o. female.  HPI Pt was seen at 1535. Per EMS report and pt, c/o gradual onset and persistence of constant "palpitations" that she noticed approximately 0400 this morning. Pt states she woke up and had N/V before she noticed the palpitations. Pt states she took her usual meds without change. Has been associated with SOB and fatigue. Pt states 2 days ago she had a colonoscopy and a transfusion of platelets. EMS states they gave IV adenosine 89m, 174men route, as well as IV metoprolol [id number] change. Denies CP, no cough, no fevers, no abd pain, no diarrhea, no black or blood in stools or emesis.    Past Medical History:  Diagnosis Date  . Anemia    hx of  . Arthritis    knees  . Diabetes mellitus without complication (HCCFords  type 2  . Fibromyalgia   . HTN (hypertension)   . Hx of colonic polyps    s/p partial colectomy  . Hyperlipidemia   . Joint pain    in knees and back spasms  . Left leg swelling    wear compression hose  . Liver cirrhosis secondary to NASH (nonalcoholic steatohepatitis) (HCCChristopherx nov 2014   had enlarged spleen also   . Low blood pressure    bp varies  . Morbid obesity (HCCPerrysville . Persistent atrial fibrillation   . Pneumonia 10/2016  . PONV (postoperative nausea and vomiting)    in past none recent  . Portal hypertension (HCC[place] . Sleep apnea    uses cpap setting of 14  . Spleen enlarged   . 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 ([facility]   Patient Active Problem List   Diagnosis Date Noted  . Atrial fibrillation with RVR (HCCChicken8/04/2018  . UTI (urinary tract infection) 04/26/2018  . Liver cirrhosis secondary to NASH (nonalcoholic steatohepatitis) (HCCNorth Charleston8/04/2018  . Hyperlipidemia 04/26/2018  . Type 2  diabetes mellitus with other specified complication (HCCWhitewright . Cellulitis of right lower extremity 03/15/2018  . Severe sepsis (HCCSt. Hedwig6/26/2019  . Acute hypoxemic respiratory failure (HCCHubbard2/13/2018  . Community acquired pneumonia of right lower lobe of lung (HCCLake Park2/08/2017  . Muscular abdominal pain in right upper quadrant 07/11/2016  . Chronic diastolic CHF (congestive heart failure) (HCCCyrus0/22/2017  . Thrombocytopenia (HCCSlayden0/22/2017  . Permanent atrial fibrillation 01/30/2016  . Type 2 diabetes mellitus without complication, without long-term current use of insulin (HCCMills5/13/2017  . Acute on chronic combined systolic and diastolic CHF (congestive heart failure) (HCCTwinsburg5/05/2016  . Morbid obesity- 12/10/2015  . Atrial fibrillation with rapid ventricular response (HCCFoxworth3/21/2017  . Melena 01/31/2015  . Aortic heart murmur 01/31/2015  . Cirrhosis of liver, with portal HTN & Acites, by CT Abdomen 07/20/13 07/23/2013  . Pancytopenia (HCCCentral Square1/12/2012  . Obstructive sleep apnea 08/07/2007  . Essential hypertension 08/07/2007  . ALLERGIC RHINITIS 08/07/2007    Past Surgical History:  Procedure Laterality Date  . BREAST LUMPECTOMY WITH RADIOACTIVE SEED LOCALIZATION Right 08/29/2017   Procedure: RIGHT BREAST LUMPECTOMY WITH RADIOACTIVE SEEDS LOCALIZATION;  Surgeon: CorErroll LunaD;  [facility] [place];  Laterality: Right;  . CESAREAN SECTION  1989  . CHOLECYSTECTOMY  20 yrs  ago  . COLECTOMY  2011  . COLONOSCOPY    . COLONOSCOPY N/A 09/05/2018   Procedure: COLONOSCOPY;  Surgeon: [name], MD;  Location: [facility];  Service: Endoscopy;  Laterality: N/A;  . DILATATION & CURETTAGE/HYSTEROSCOPY WITH MYOSURE N/A 05/06/2016   Procedure: DILATATION & CURETTAGE/HYSTEROSCOPY WITH MYOSURE WITH POLYPECTOMY;  Surgeon: Janyth Pupa, DO;  Location: [facility];  Service: Gynecology;  Laterality: N/A;  . ESOPHAGOGASTRODUODENOSCOPY (EGD) WITH PROPOFOL N/A 09/04/2013    Procedure: ESOPHAGOGASTRODUODENOSCOPY (EGD) WITH PROPOFOL;  Surgeon: [name], MD;  Location: [facility];  Service: Endoscopy;  Laterality: N/A;  . KNEE ARTHROSCOPY  yrs ago   bilateral, one done x 1, one done twice  . POLYPECTOMY  09/05/2018   Procedure: POLYPECTOMY;  Surgeon: [name], MD;  Location: [facility];  Service: Endoscopy;;     OB History   No obstetric history on file.      Home Medications    Prior to Admission medications   Medication Sig Start Date End Date Taking? Authorizing Provider  cyclobenzaprine (FLEXERIL) 10 MG tablet Take 10 mg by mouth 2 (two) times daily as needed for muscle spasms.    Yes [provider]  diltiazem (CARDIZEM CD) 300 MG 24 hr capsule TAKE 1 CAPSULE BY MOUTH EVERY DAY Patient taking differently: Take 300 mg by mouth daily. TAKE 1 CAPSULE BY MOUTH EVERY DAY 06/22/18  Yes Skeet Latch, MD  ibuprofen (ADVIL,MOTRIN) 200 MG tablet Take 400 mg by mouth every 6 (six) hours as needed for moderate pain.   Yes [provider]  JARDIANCE 25 MG TABS tablet Take 25 mg by mouth daily after breakfast.  01/28/17  Yes [provider]  KLOR-CON M20 20 MEQ tablet Take 1 tablet (20 mEq total) by mouth daily. 03/27/18  Yes Barrett, Evelene Croon, PA-C  propranolol (INDERAL) 10 MG tablet Take 1 tablet (10 mg total) by mouth 2 (two) times daily. 09/05/18  Yes Skeet Latch, MD  spironolactone (ALDACTONE) 25 MG tablet Take 25 mg by mouth daily after breakfast.  07/24/13  Yes Thurnell Lose, MD  bumetanide (BUMEX) 2 MG tablet Take 2-3 mg by mouth 2 (two) times daily as needed (fluid).     [provider]  calcium carbonate (OS-CAL - DOSED IN MG OF ELEMENTAL CALCIUM) 1250 (500 Ca) MG tablet Take 1 tablet by mouth daily with breakfast.     [provider]    Family History Family History  Problem Relation Age of Onset  . Heart failure Mother   . Cancer Mother   . Hyperlipidemia Mother   . Congestive  Heart Failure Mother   . Stroke Mother   . Lung cancer Father   . Cancer Father   . Cancer Brother   . Hyperlipidemia Brother   . Stroke Other     Social History Social History   Tobacco Use  . Smoking status: Former Smoker    Packs/day: 1.00    Years: 3.00    Pack years: 3.00    Types: Cigarettes    Last attempt to quit: 09/19/1974    Years since quitting: 43.9  . Smokeless tobacco: Never Used  Substance Use Topics  . Alcohol use: Yes    Comment: occ  . Drug use: No     Allergies   Celecoxib; Shellfish allergy; Barium-containing compounds; Prednisone; Statins; Fentanyl; Ibuprofen; and Tylenol [acetaminophen]   Review of Systems Review of Systems ROS: Statement: All systems negative except as marked or noted in the HPI; Constitutional: Negative  for fever and chills. +generalized fatigue.; ; Eyes: Negative for eye pain, redness and discharge. ; ; ENMT: Negative for ear pain, hoarseness, nasal congestion, sinus pressure and sore throat. ; ; Cardiovascular: +palpitations, SOB. Negative for chest pain, diaphoresis, and peripheral edema. ; ; Respiratory: Negative for cough, wheezing and stridor. ; ; Gastrointestinal: +N/V. Negative for diarrhea, abdominal pain, blood in stool, hematemesis, jaundice and rectal bleeding. . ; ; Genitourinary: Negative for dysuria, flank pain and hematuria. ; ; Musculoskeletal: Negative for back pain and neck pain. Negative for swelling and trauma.; ; Skin: Negative for pruritus, rash, abrasions, blisters, bruising and skin lesion.; ; Neuro: Negative for headache, lightheadedness and neck stiffness. Negative for weakness, altered level of consciousness, altered mental status, extremity weakness, paresthesias, involuntary movement, seizure and syncope.       Physical Exam Updated Vital Signs BP 109/64   Pulse 79   Temp 98.4 F (36.9 C) (Oral)   Resp 19   SpO2 96%    Patient Vitals for the past 24 hrs:  BP Temp Temp src Pulse Resp SpO2  09/07/18  1715 (!) 136/56 - - (!) 110 (!) 22 96 %  09/07/18 1700 (!) 127/95 - - (!) 101 14 97 %  09/07/18 1655 - - - (!) 106 17 94 %  09/07/18 1650 - - - (!) 107 19 96 %  09/07/18 1645 117/65 - - (!) 103 16 93 %  09/07/18 1642 - - - (!) 103 18 93 %  09/07/18 1641 - - - (!) 110 15 96 %  09/07/18 1640 - - - (!) 114 (!) 23 96 %  09/07/18 1635 - - - (!) 110 19 96 %  09/07/18 1630 (!) 118/97 - - (!) 114 15 96 %  09/07/18 1616 (!) 116/56 - - (!) 118 (!) 21 96 %  09/07/18 1515 109/64 98.4 F (36.9 C) Oral 79 19 96 %     Physical Exam 1540: Physical examination:  Nursing notes reviewed; Vital signs and O2 SAT reviewed;  Constitutional: Well developed, Well nourished, Well hydrated, In no acute distress; Head:  Normocephalic, atraumatic; Eyes: EOMI, PERRL, No scleral icterus; ENMT: Mouth and pharynx normal, Mucous membranes moist; Neck: Supple, Full range of motion, No lymphadenopathy; Cardiovascular: Irregular tachycardic rate and rhythm, No gallop; Respiratory: Breath sounds coarse & equal bilaterally, No wheezes.  Speaking full sentences with ease, Normal respiratory effort/excursion; Chest: Nontender, Movement normal; Abdomen: Soft, Nontender, Nondistended, Normal bowel sounds; Genitourinary: No CVA tenderness; Extremities: Peripheral pulses normal, No tenderness, No edema, No calf edema or asymmetry.; Neuro: AA&Ox3, Major CN grossly intact.  Speech clear. No gross focal motor or sensory deficits in extremities.; Skin: Color normal, Warm, Dry.    ED Treatments / Results  Labs (all labs ordered are listed, but only abnormal results are displayed)   EKG EKG Interpretation  Date/Time:  Friday September 07 2018 15:22:01 EST Ventricular Rate:  120 PR Interval:    QRS Duration: 95 QT Interval:  307 QTC Calculation: 434 R Axis:   -35 Text Interpretation:  Atrial fibrillation with rapid ventricular response Left axis deviation Anteroseptal infarct, age indeterminate Baseline wander When compared with  ECG of 04/26/2018 No significant change was found Confirmed by Francine Graven [phone]) on 09/07/2018 3:41:11 PM   Radiology   Procedures Procedures (including critical care time)  Medications Ordered in ED Medications  diltiazem (CARDIZEM) 1 mg/mL load via infusion 10 mg (has no administration in time range)    And  diltiazem (CARDIZEM) 100 mg  in dextrose 5% 133m (1 mg/mL) infusion (has no administration in time range)     Initial Impression / Assessment and Plan / ED Course  I have reviewed the triage vital signs and the nursing notes.  Pertinent labs & imaging results that were available during my care of the patient were reviewed by me and considered in my medical decision making (see chart for details).  MDM Reviewed: previous chart, nursing note and vitals Reviewed previous: labs and ECG Interpretation: labs, ECG and x-ray Total time providing critical care: 30-74 minutes. This excludes time spent performing separately reportable procedures and services. Consults: admitting MD   CRITICAL CARE Performed by: KFrancine GravenTotal critical care time: 35 minutes Critical care time was exclusive of separately billable procedures and treating other patients. Critical care was necessary to treat or prevent imminent or life-threatening deterioration. Critical care was time spent personally by me on the following activities: development of treatment plan with patient and/or surrogate as well as nursing, discussions with consultants, evaluation of patient's response to treatment, examination of patient, obtaining history from patient or surrogate, ordering and performing treatments and interventions, ordering and review of laboratory studies, ordering and review of radiographic studies, pulse oximetry and re-evaluation of patient's condition.  Results for orders placed or performed during the hospital encounter of 101/09/32 Basic metabolic panel  Result Value Ref Range   Sodium  137 135 - 145 mmol/L   Potassium 4.0 3.5 - 5.1 mmol/L   Chloride 106 98 - 111 mmol/L   CO2 18 (L) 22 - 32 mmol/L   Glucose, Bld 126 (H) 70 - 99 mg/dL   BUN 16 8 - 23 mg/dL   Creatinine, Ser 0.71 0.44 - 1.00 mg/dL   Calcium 8.7 (L) 8.9 - 10.3 mg/dL   GFR calc non Af Amer >60 >60 mL/min   GFR calc Af Amer >60 >60 mL/min   Anion gap 13 5 - 15  CBC  Result Value Ref Range   WBC 4.4 4.0 - 10.5 K/uL   RBC 4.04 3.87 - 5.11 MIL/uL   Hemoglobin 12.4 12.0 - 15.0 g/dL   HCT 37.7 36.0 - 46.0 %   MCV 93.3 80.0 - 100.0 fL   MCH 30.7 26.0 - 34.0 pg   MCHC 32.9 30.0 - 36.0 g/dL   RDW 15.9 (H) 11.5 - 15.5 %   Platelets 39 (L) 150 - 400 K/uL   nRBC 0.0 0.0 - 0.2 %  Brain natriuretic peptide  Result Value Ref Range   B Natriuretic Peptide 266.0 (H) 0.0 - 100.0 pg/mL  Hepatic function panel  Result Value Ref Range   Total Protein 6.4 (L) 6.5 - 8.1 g/dL   Albumin 3.3 (L) 3.5 - 5.0 g/dL   AST 48 (H) 15 - 41 U/L   ALT 26 0 - 44 U/L   Alkaline Phosphatase 70 38 - 126 U/L   Total Bilirubin 5.5 (H) 0.3 - 1.2 mg/dL   Bilirubin, Direct 0.7 (H) 0.0 - 0.2 mg/dL   Indirect Bilirubin 4.8 (H) 0.3 - 0.9 mg/dL  Lipase, blood  Result Value Ref Range   Lipase 22 11 - 51 U/L  Protime-INR  Result Value Ref Range   Prothrombin Time 15.2 11.4 - 15.2 seconds   INR 1.21   Differential  Result Value Ref Range   Neutrophils Relative % 78 %   Neutro Abs 3.4 1.7 - 7.7 K/uL   Lymphocytes Relative 10 %   Lymphs Abs 0.4 (L) 0.7 - 4.0  K/uL   Monocytes Relative 11 %   Monocytes Absolute 0.5 0.1 - 1.0 K/uL   Eosinophils Relative 2 %   Eosinophils Absolute 0.1 0.0 - 0.5 K/uL   Basophils Relative 0 %   Basophils Absolute 0.0 0.0 - 0.1 K/uL  I-stat troponin, ED  Result Value Ref Range   Troponin i, poc 0.00 0.00 - 0.08 ng/mL   Comment 3           Dg Chest 2 View Result Date: 09/07/2018 CLINICAL DATA:  Short of breath hypertension EXAM: CHEST - 2 VIEW COMPARISON:  04/26/2018 FINDINGS: Cardiac enlargement with  mild vascular congestion. Negative for edema or effusion. Mild bibasilar atelectasis. Negative for pneumonia. IMPRESSION: Cardiac enlargement with mild vascular congestion. Negative for edema. Electronically Signed   By: Franchot Gallo M.D.   On: 09/07/2018 15:38   Results for LIOR, CARTELLI (MRN [id number]) as of 09/07/2018 17:36  Ref. Range 01/26/2016 11:28 10/31/2016 17:17 04/26/2018 13:18 09/07/2018 15:16  B Natriuretic Peptide Latest Ref Range: 0.0 - 100.0 pg/mL 223.1 (H) 232.8 (H) 273.8 (H) 266.0 (H)     1745:  Pt with afib/RVR, rates to 130's on arrival. IV cardizem bolus and gtt started with good response. HR low low 100's. SBP 120-130's, stable. No N/V while in the ED. BNP mildly elevated from baseline, but no acute CHF on CXR. Dx and testing d/w pt and family.  Questions answered.  Verb understanding, agreeable to admit.  T/C returned from [facility]. Tyrell Antonio, case discussed, including:  HPI, pertinent PM/SHx, VS/PE, dx testing, ED course and treatment:  Agreeable to admit.      Final Clinical Impressions(s) / ED Diagnoses   Final diagnoses:  None    ED Discharge Orders    None       Francine Graven, DO 09/10/18 [id number]

## 2018-09-08 ENCOUNTER — Other Ambulatory Visit: Payer: Self-pay

## 2018-09-08 ENCOUNTER — Encounter (HOSPITAL_COMMUNITY): Payer: Self-pay | Admitting: *Deleted

## 2018-09-08 DIAGNOSIS — I4891 Unspecified atrial fibrillation: Secondary | ICD-10-CM | POA: Diagnosis not present

## 2018-09-08 LAB — COMPREHENSIVE METABOLIC PANEL
ALT: 25 U/L (ref 0–44)
AST: 41 U/L (ref 15–41)
Albumin: 3.3 g/dL — ABNORMAL LOW (ref 3.5–5.0)
Alkaline Phosphatase: 67 U/L (ref 38–126)
Anion gap: 10 (ref 5–15)
BUN: 16 mg/dL (ref 8–23)
CO2: 20 mmol/L — ABNORMAL LOW (ref 22–32)
Calcium: 8.5 mg/dL — ABNORMAL LOW (ref 8.9–10.3)
Chloride: 105 mmol/L (ref 98–111)
Creatinine, Ser: 0.71 mg/dL (ref 0.44–1.00)
GFR calc non Af Amer: >60 mL/min (ref >60)
Glucose, Bld: 108 mg/dL — ABNORMAL HIGH (ref 70–99)
Potassium: 3.8 mmol/L (ref 3.5–5.1)
Sodium: 135 mmol/L (ref 135–145)
Total Bilirubin: 5.4 mg/dL — ABNORMAL HIGH (ref 0.3–1.2)
Total Protein: 6.2 g/dL — ABNORMAL LOW (ref 6.5–8.1)

## 2018-09-08 LAB — CBC
HCT: 38.3 % (ref 36.0–46.0)
Hemoglobin: 12.3 g/dL (ref 12.0–15.0)
MCH: 30 pg (ref 26.0–34.0)
MCHC: 32.1 g/dL (ref 30.0–36.0)
MCV: 93.4 fL (ref 80.0–100.0)
Platelets: 38 10*3/uL — ABNORMAL LOW (ref 150–400)
RBC: 4.1 MIL/uL (ref 3.87–5.11)
RDW: 16 % — ABNORMAL HIGH (ref 11.5–15.5)
WBC: 3.2 10*3/uL — AB (ref 4.0–10.5)
nRBC: 0 % (ref 0.0–0.2)

## 2018-09-08 LAB — BILIRUBIN, FRACTIONATED(TOT/DIR/INDIR)
BILIRUBIN INDIRECT: 4.5 mg/dL — AB (ref 0.3–0.9)
Bilirubin, Direct: 0.9 mg/dL — ABNORMAL HIGH (ref 0.0–0.2)
Total Bilirubin: 5.4 mg/dL — ABNORMAL HIGH (ref 0.3–1.2)

## 2018-09-08 LAB — MRSA PCR SCREENING: MRSA by PCR: NEGATIVE

## 2018-09-08 MED ORDER — DILTIAZEM HCL ER COATED BEADS 180 MG PO CP24
300.0000 mg | ORAL_CAPSULE | Freq: Every day | ORAL | Status: DC
  Administered 2018-09-08: 300 mg via ORAL
  Filled 2018-09-08: qty 1

## 2018-09-08 MED ORDER — PANTOPRAZOLE SODIUM 40 MG PO TBEC: 40.0000 mg | DELAYED_RELEASE_TABLET | Freq: Every day | ORAL | 1 refills | Status: DC

## 2018-09-08 NOTE — Discharge Instructions (Signed)
Please resume spironolactone ((12-23), use Bumex as needed for fluid retention.

## 2018-09-08 NOTE — Progress Notes (Signed)
Reviewed discharge instructions with the patient.  One prescription given to patient.  No questions asked per patient.  To go home with family member.  Two IV's in Left wrist and hand d/c'd intact.  Discharge instructions given to patient.  Venba Zenner Roselie Awkward RN

## 2018-09-08 NOTE — Discharge Summary (Signed)
Physician Discharge Summary  Hannah Khan XBM:[id number] DOB: 11-22-1955 DOA: 09/07/2018  PCP: Lujean Amel, MD  Admit date: 09/07/2018 Discharge date: 09/08/2018  Admitted From: Home  Disposition:  Home   Recommendations for Outpatient Follow-up:  1. Follow up with PCP in 1-2 weeks 2. Please obtain BMP/CBC in one week 3. Needs to follow up with cardiology and GI.     Discharge Condition: stable.  CODE STATUS: full code.  Diet recommendation: Heart Healthy  Brief/Interim Summary:  Hannah Khan is a 62 y.o. female with medical history significant of past medical history of hypertension, anemia, diabetes, chronic thrombocytopenia related to liver failure, Karlene Lineman cirrhosis 0 of A. fib not on anticoagulation due to thrombocytopenia and increased risk for bleeding. Presents today complaining of shortness of breath, palpitation, chest discomfort.  Reports that after the colonoscopy she daily after developed nausea, headache.  She also reports that the night prior to admission she vomited, multiple times.  She also vomited this morning.  Was able to keep down her Cardizem.  This morning she checked her blood pressure and her heart rate and her heart rate was in the 160 range.  After she took her medication her heart rate decreased to 135.  Still feeling short of breath and with the palpitation.  She was transferred to the ED by EMS.  She received adenosine by EMS provider.  She felt like her heart stop after the adenosine and she felt sick.  He is feeling better now.  ED Course: Patient was found to be in A. fib RVR.  Started on Cardizem drip.  BNP mildly elevated at 266, troponins negative, CO2 at 18, BUN at 16, bilirubin at 5.5 , unlikely elevated.  Direct is elevated at 4. Chest x-ray with mild vascular congestion.  1-A. fib RVR; Suspect related to dehydration. Started on IV Cardizem drip Her hear rate has been well controlled on Cardizem Gtt. She will be transition to oral Cardizem  and continue inderal. I suspect A fib with RVR was related to dehydration from vomiting after colonoscopy.  If heart rate remain stable today, plan to discharge today.   2-nausea vomiting; Might be related to acute viral gastroenteritis. IV Protonix. Resolved. Tolerating diet.   3-Thrombocytopenia; chronic. Monitor.   4-Bilirubinemia;  Prior level at 5.  bilirubin stable at baseline. HB stable no evidence of hemolysis.    5-Nash , cirrhosis; hold spironolactone and Bumex to avoid further dehydration. Advised patinet to resume diuretic in 2 days.   6-OSA; CPAP.    Discharge Diagnoses:    Atrial fibrillation with RVR ([place])   Obstructive sleep apnea   Essential hypertension   Cirrhosis of liver, with portal HTN & Acites, by CT Abdomen 07/20/13   Pancytopenia ([place])   Type 2 diabetes mellitus without complication, without long-term current use of insulin (HCC)   Chronic diastolic CHF (congestive heart failure) (HCC)   Thrombocytopenia (HCC)   Liver cirrhosis secondary to NASH (nonalcoholic steatohepatitis) [facility])    Discharge Instructions  Discharge Instructions    Diet - low sodium heart healthy   Complete by:  As directed    Increase activity slowly   Complete by:  As directed      Allergies as of 09/08/2018      Reactions   Celecoxib Other (See Comments)   "speech slurred" with celebrex   Shellfish Allergy Swelling, Rash   TINGLING AND SWELLING OF LIPS. LARGE WHELPS QUARTER-SIZE   Barium-containing Compounds Other (See Comments)   TACHYCARDIA  Prednisone Other (See Comments)   TACHYCARDIA   Statins Other (See Comments)   AGGRAVATED FIBROMYALGIA   Fentanyl    Hallucinations    Ibuprofen Other (See Comments)   UNSPECIFIED SPECIFIC REACTION >> "DUE TO PLATELETS"   Tramadol    Hallucination   Tylenol [acetaminophen] Other (See Comments)   UNSPECIFIED SPECIFIC REACTION >> "DUE TO PLATELETS"      Medication List    STOP taking these medications    spironolactone 25 MG tablet Commonly known as:  ALDACTONE     TAKE these medications   bumetanide 2 MG tablet Commonly known as:  BUMEX Take 2-3 mg by mouth 2 (two) times daily as needed (fluid).   calcium carbonate 1250 (500 Ca) MG tablet Commonly known as:  OS-CAL - dosed in mg of elemental calcium Take 1 tablet by mouth daily with breakfast.   cyclobenzaprine 10 MG tablet Commonly known as:  FLEXERIL Take 10 mg by mouth 2 (two) times daily as needed for muscle spasms.   diltiazem 300 MG 24 hr capsule Commonly known as:  CARDIZEM CD TAKE 1 CAPSULE BY MOUTH EVERY DAY What changed:    how much to take  how to take this  when to take this   ibuprofen 200 MG tablet Commonly known as:  ADVIL,MOTRIN Take 400 mg by mouth every 6 (six) hours as needed for moderate pain.   JARDIANCE 25 MG Tabs tablet Generic drug:  empagliflozin Take 25 mg by mouth daily after breakfast.   KLOR-CON M20 20 MEQ tablet Generic drug:  potassium chloride SA Take 1 tablet (20 mEq total) by mouth daily.   pantoprazole 40 MG tablet Commonly known as:  PROTONIX Take 1 tablet (40 mg total) by mouth daily.   propranolol 10 MG tablet Commonly known as:  INDERAL Take 1 tablet (10 mg total) by mouth 2 (two) times daily.      Follow-up Information    Koirala, Dibas, MD Follow up in 1 week(s).   Specialty:  Family Medicine Contact information: [place] [address] [phone]        Skeet Latch, MD Follow up in 1 week(s).   Specialty:  Cardiology Contact information: [address] [place] [address] [phone]          Allergies  Allergen Reactions  . Celecoxib Other (See Comments)    "speech slurred" with celebrex  . Shellfish Allergy Swelling and Rash    TINGLING AND SWELLING OF LIPS. LARGE WHELPS QUARTER-SIZE  . Barium-Containing Compounds Other (See Comments)    TACHYCARDIA  . Prednisone Other (See Comments)     TACHYCARDIA  . Statins Other (See Comments)    AGGRAVATED FIBROMYALGIA  . Fentanyl     Hallucinations   . Ibuprofen Other (See Comments)    UNSPECIFIED SPECIFIC REACTION >> "DUE TO PLATELETS"  . Tramadol     Hallucination   . Tylenol [Acetaminophen] Other (See Comments)    UNSPECIFIED SPECIFIC REACTION >> "DUE TO PLATELETS"    Consultations: none  Procedures/Studies: Dg Chest 2 View  Result Date: 09/07/2018 CLINICAL DATA:  Short of breath hypertension EXAM: CHEST - 2 VIEW COMPARISON:  04/26/2018 FINDINGS: Cardiac enlargement with mild vascular congestion. Negative for edema or effusion. Mild bibasilar atelectasis. Negative for pneumonia. IMPRESSION: Cardiac enlargement with mild vascular congestion. Negative for edema. Electronically Signed   By: Franchot Gallo M.D.   On: 09/07/2018 15:38   Ct Humerus Right Wo Contrast  Result Date: 08/29/2018  CLINICAL DATA:  Persistent right humerus pain since fracture last January. Evaluate for healing. EXAM: CT OF THE RIGHT HUMERUS WITHOUT CONTRAST TECHNIQUE: Multidetector CT imaging was performed according to the standard protocol. Multiplanar CT image reconstructions were also generated. COMPARISON:  Right humerus x-rays dated September 28, 2017. FINDINGS: Bones/Joint/Cartilage Healed, partially nonunited oblique fracture of the proximal and mid humeral diaphysis with well corticated fracture margins. No acute fracture or dislocation. No cortical destruction or periosteal reaction. Ligaments Suboptimally assessed by CT. Muscles and Tendons Unremarkable. Soft tissues 1 cm superficial low-density lesion associated with the skin of the right anterior shoulder, likely a sebaceous cyst. No soft tissue mass or fluid collection. Cardiomegaly with trace bilateral pleural effusions. Scattered linear subsegmental atelectasis/scarring. Mild mosaic attenuation of the lungs. IMPRESSION: 1. Healed, partially nonunited oblique fracture of the proximal and mid humeral  diaphysis. 2. Cardiomegaly with trace bilateral pleural effusions. Electronically Signed   By: Titus Dubin M.D.   On: 08/29/2018 08:38     Subjective: She is feeling much better. Denies dyspnea, chest pain. \ She has been able to tolerates oral.   Discharge Exam: Vitals:   09/08/18 1300 09/08/18 1338  BP:    Pulse:    Resp: 18 19  Temp:    SpO2: 100% 100%   Vitals:   09/08/18 1140 09/08/18 1200 09/08/18 1300 09/08/18 1338  BP:  130/86    Pulse:      Resp:  17 18 19   Temp: 97.6 F (36.4 C)     TempSrc: Oral     SpO2:  100% 100% 100%  Weight:      Height:        General: Pt is alert, awake, not in acute distress Cardiovascular: RRR, S1/S2 +, no rubs, no gallops Respiratory: CTA bilaterally, no wheezing, no rhonchi Abdominal: Soft, NT, ND, bowel sounds + Extremities: no edema, no cyanosis    The results of significant diagnostics from this hospitalization (including imaging, microbiology, ancillary and laboratory) are listed below for reference.     Microbiology: Recent Results (from the past 240 hour(s))  MRSA PCR Screening     Status: None   Collection Time: 09/08/18 12:00 AM  Result Value Ref Range Status   MRSA by PCR NEGATIVE NEGATIVE Final    Comment:        The GeneXpert MRSA Assay (FDA approved for NASAL specimens only), is one component of a comprehensive MRSA colonization surveillance program. It is not intended to diagnose MRSA infection nor to guide or monitor treatment for MRSA infections. Performed at [facility], [place] [address] [place], [place] [address]      Labs: BNP (last 3 results) Recent Labs    04/26/18 1318 09/07/18 1516  BNP 273.8* 287.8*   Basic Metabolic Panel: Recent Labs  Lab 09/05/18 1025 09/07/18 1516 09/08/18 0312  NA 141 137 135  K 3.9 4.0 3.8  CL  --  106 105  CO2  --  18* 20*  GLUCOSE 119* 126* 108*  BUN  --  16 16  CREATININE  --  0.71 0.71  CALCIUM  --  8.7* 8.5*   Liver  Function Tests: Recent Labs  Lab 09/07/18 1516 09/08/18 0312 09/08/18 0810  AST 48* 41  --   ALT 26 25  --   ALKPHOS 70 67  --   BILITOT 5.5* 5.4* 5.4*  PROT 6.4* 6.2*  --   ALBUMIN 3.3* 3.3*  --    Recent Labs  Lab 09/07/18  1516  LIPASE 22   No results for input(s): AMMONIA in the last 168 hours. CBC: Recent Labs  Lab 09/05/18 1019 09/05/18 1025 09/07/18 1516 09/08/18 0312  WBC 4.7  --  4.4 3.2*  NEUTROABS  --   --  3.4  --   HGB 12.3 11.6* 12.4 12.3  HCT 38.3 34.0* 37.7 38.3  MCV 95.8  --  93.3 93.4  PLT 40*  --  39* 38*   Cardiac Enzymes: No results for input(s): CKTOTAL, CKMB, CKMBINDEX, TROPONINI in the last 168 hours. BNP: Invalid input(s): POCBNP CBG: Recent Labs  Lab 09/05/18 0727  GLUCAP 141*   D-Dimer No results for input(s): DDIMER in the last 72 hours. Hgb A1c No results for input(s): HGBA1C in the last 72 hours. Lipid Profile No results for input(s): CHOL, HDL, LDLCALC, TRIG, CHOLHDL, LDLDIRECT in the last 72 hours. Thyroid function studies No results for input(s): TSH, T4TOTAL, T3FREE, THYROIDAB in the last 72 hours.  Invalid input(s): FREET3 Anemia work up No results for input(s): VITAMINB12, FOLATE, FERRITIN, TIBC, IRON, RETICCTPCT in the last 72 hours. Urinalysis    Component Value Date/Time   COLORURINE YELLOW 04/26/2018 1520   APPEARANCEUR CLEAR 04/26/2018 1520   LABSPEC 1.028 04/26/2018 1520   PHURINE 6.0 04/26/2018 1520   GLUCOSEU >=500 (A) 04/26/2018 1520   HGBUR MODERATE (A) 04/26/2018 1520   BILIRUBINUR NEGATIVE 04/26/2018 1520   KETONESUR 5 (A) 04/26/2018 1520   PROTEINUR NEGATIVE 04/26/2018 1520   UROBILINOGEN 2.0 (H) 01/31/2015 0055   NITRITE POSITIVE (A) 04/26/2018 1520   LEUKOCYTESUR NEGATIVE 04/26/2018 1520   Sepsis Labs Invalid input(s): PROCALCITONIN,  WBC,  LACTICIDVEN Microbiology Recent Results (from the past 240 hour(s))  MRSA PCR Screening     Status: None   Collection Time: 09/08/18 12:00 AM  Result  Value Ref Range Status   MRSA by PCR NEGATIVE NEGATIVE Final    Comment:        The GeneXpert MRSA Assay (FDA approved for NASAL specimens only), is one component of a comprehensive MRSA colonization surveillance program. It is not intended to diagnose MRSA infection nor to guide or monitor treatment for MRSA infections. Performed at [facility], [place] [address], [place], [place] [address]      Time coordinating discharge: 35 minutes.   SIGNED:   Elmarie Shiley, MD  [facility] 09/08/2018, 1:44 PM   If 7PM-7AM, please contact night-coverage [email or web address] Password [email or web address]

## 2018-09-15 ENCOUNTER — Encounter (HOSPITAL_COMMUNITY): Payer: Self-pay | Admitting: Emergency Medicine

## 2018-09-15 ENCOUNTER — Emergency Department (HOSPITAL_COMMUNITY)
Admission: EM | Admit: 2018-09-15 | Discharge: 2018-09-16 | Disposition: A | Discharge status: Home / Self Care | Payer: BC Managed Care – PPO | Attending: Emergency Medicine | Admitting: Emergency Medicine

## 2018-09-15 ENCOUNTER — Emergency Department (HOSPITAL_COMMUNITY): Payer: BC Managed Care – PPO

## 2018-09-15 ENCOUNTER — Other Ambulatory Visit: Payer: Self-pay

## 2018-09-15 DIAGNOSIS — Z87891 Personal history of nicotine dependence: Secondary | ICD-10-CM | POA: Diagnosis not present

## 2018-09-15 DIAGNOSIS — I5042 Chronic combined systolic (congestive) and diastolic (congestive) heart failure: Secondary | ICD-10-CM | POA: Insufficient documentation

## 2018-09-15 DIAGNOSIS — J45909 Unspecified asthma, uncomplicated: Secondary | ICD-10-CM | POA: Diagnosis not present

## 2018-09-15 DIAGNOSIS — W19XXXA Unspecified fall, initial encounter: Secondary | ICD-10-CM

## 2018-09-15 DIAGNOSIS — E119 Type 2 diabetes mellitus without complications: Secondary | ICD-10-CM | POA: Diagnosis not present

## 2018-09-15 DIAGNOSIS — I4819 Other persistent atrial fibrillation: Secondary | ICD-10-CM | POA: Diagnosis not present

## 2018-09-15 DIAGNOSIS — N3001 Acute cystitis with hematuria: Secondary | ICD-10-CM

## 2018-09-15 DIAGNOSIS — I110 Hypertensive heart disease with heart failure: Secondary | ICD-10-CM | POA: Diagnosis not present

## 2018-09-15 DIAGNOSIS — M79601 Pain in right arm: Secondary | ICD-10-CM

## 2018-09-15 DIAGNOSIS — Z79899 Other long term (current) drug therapy: Secondary | ICD-10-CM | POA: Diagnosis not present

## 2018-09-15 DIAGNOSIS — R42 Dizziness and giddiness: Secondary | ICD-10-CM | POA: Insufficient documentation

## 2018-09-15 LAB — COMPREHENSIVE METABOLIC PANEL
ALT: 24 U/L (ref 0–44)
AST: 42 U/L — AB (ref 15–41)
Albumin: 3.7 g/dL (ref 3.5–5.0)
Alkaline Phosphatase: 77 U/L (ref 38–126)
Anion gap: 9 (ref 5–15)
BUN: 13 mg/dL (ref 8–23)
CO2: 25 mmol/L (ref 22–32)
Calcium: 9.2 mg/dL (ref 8.9–10.3)
Chloride: 108 mmol/L (ref 98–111)
Creatinine, Ser: 0.62 mg/dL (ref 0.44–1.00)
GFR calc Af Amer: >60 mL/min (ref >60)
GFR calc non Af Amer: >60 mL/min (ref >60)
Glucose, Bld: 114 mg/dL — ABNORMAL HIGH (ref 70–99)
POTASSIUM: 4.4 mmol/L (ref 3.5–5.1)
Sodium: 142 mmol/L (ref 135–145)
Total Bilirubin: 2.7 mg/dL — ABNORMAL HIGH (ref 0.3–1.2)
Total Protein: 7.3 g/dL (ref 6.5–8.1)

## 2018-09-15 LAB — URINALYSIS, ROUTINE W REFLEX MICROSCOPIC
Bilirubin Urine: NEGATIVE
Glucose, UA: >=500 mg/dL — AB
Ketones, ur: 5 mg/dL — AB
Nitrite: NEGATIVE
Protein, ur: NEGATIVE mg/dL
Specific Gravity, Urine: 1.018 (ref 1.005–1.030)
pH: 7 (ref 5.0–8.0)

## 2018-09-15 LAB — CBG MONITORING, ED: Glucose-Capillary: 104 mg/dL — ABNORMAL HIGH (ref 70–99)

## 2018-09-15 MED ORDER — CEPHALEXIN 500 MG PO CAPS
500.0000 mg | ORAL_CAPSULE | Freq: Once | ORAL | Status: AC
  Administered 2018-09-16: 500 mg via ORAL
  Filled 2018-09-15: qty 1

## 2018-09-15 MED ORDER — CYCLOBENZAPRINE HCL 10 MG PO TABS
10.0000 mg | ORAL_TABLET | Freq: Once | ORAL | Status: AC
  Administered 2018-09-16: 10 mg via ORAL
  Filled 2018-09-15: qty 1

## 2018-09-15 MED ORDER — SODIUM CHLORIDE 0.9 % IV BOLUS
500.0000 mL | Freq: Once | INTRAVENOUS | Status: AC
  Administered 2018-09-15: 500 mL via INTRAVENOUS

## 2018-09-15 NOTE — ED Notes (Signed)
Bed: WA06 Expected date:  Expected time:  Means of arrival:  Comments: EMS/syncope

## 2018-09-15 NOTE — Discharge Instructions (Addendum)
You have been seen today for a fall. Please read and follow all provided instructions.   1. Medications: usual home medications 2. Treatment: rest, drink plenty of fluids 3. Follow Up: Please follow up with orthopedics as directed or your PCP in 1 week if no improvement for discussion of your diagnoses and further evaluation after today's visit; if you do not have a primary care doctor use the resource guide provided to find one; Please return to the ER for worsening symptoms or other concerns. Please obtain all of your results from medical records or have your doctors office obtain the results - share them with your doctor - you should be seen at your doctors office. Call today to arrange your follow up.   Take medications as prescribed. Please review all of the medicines and only take them if you do not have an allergy to them. Return to the emergency room for worsening condition or new concerning symptoms. Follow up with your regular doctor. If you don't have a regular doctor use one of the numbers below to establish a primary care doctor.  You should return to the ER if you develop severe or worsening symptoms.   Emergency Department Resource Guide 1) Find a Doctor and Pay Out of Pocket Although you won't have to find out who is covered by your insurance plan, it is a good idea to ask around and get recommendations. You will then need to call the office and see if the doctor you have chosen will accept you as a new patient and what types of options they offer for patients who are self-pay. Some doctors offer discounts or will set up payment plans for their patients who do not have insurance, but you will need to ask so you aren't surprised when you get to your appointment.  2) Contact Your Local Health Department Not all health departments have doctors that can see patients for sick visits, but many do, so it is worth a call to see if yours does. If you don't know where your local health  department is, you can check in your phone book. The CDC also has a tool to help you locate your state's health department, and many state websites also have listings of all of their local health departments.  3) Find a [facility] If your illness is not likely to be very severe or complicated, you may want to try a walk in clinic. These are popping up all over the country in pharmacies, drugstores, and shopping centers. They're usually staffed by nurse practitioners or physician assistants that have been trained to treat common illnesses and complaints. They're usually fairly quick and inexpensive. However, if you have serious medical issues or chronic medical problems, these are probably not your best option.  No Primary Care Doctor: Call Health Connect at  [phone] - they can help you locate a primary care doctor that  accepts your insurance, provides certain services, etc. Physician Referral [phone]  Emergency Department Resource Guide 1) Find a Doctor and Pay Out of Pocket Although you won't have to find out who is covered by your insurance plan, it is a good idea to ask around and get recommendations. You will then need to call the office and see if the doctor you have chosen will accept you as a new patient and what types of options they offer for patients who are self-pay. Some doctors offer discounts or will set up payment plans for their patients who do not have  insurance, but you will need to ask so you aren't surprised when you get to your appointment.  2) Contact Your Local Health Department Not all health departments have doctors that can see patients for sick visits, but many do, so it is worth a call to see if yours does. If you don't know where your local health department is, you can check in your phone book. The CDC also has a tool to help you locate your state's health department, and many state websites also have listings of all of their local health  departments.  3) Find a [facility] If your illness is not likely to be very severe or complicated, you may want to try a walk in clinic. These are popping up all over the country in pharmacies, drugstores, and shopping centers. They're usually staffed by nurse practitioners or physician assistants that have been trained to treat common illnesses and complaints. They're usually fairly quick and inexpensive. However, if you have serious medical issues or chronic medical problems, these are probably not your best option.  No Primary Care Doctor: Call Health Connect at  [phone] - they can help you locate a primary care doctor that  accepts your insurance, provides certain services, etc. Physician Referral Service- [phone]  Chronic Pain Problems: Organization         Address  Phone   Notes  [facility]  [phone] Patients need to be referred by their primary care doctor.   Medication Assistance: Organization         Address  Phone   Notes  [facility] Medication [facility] [address] [place], [place] [address] [phone] --Must be a resident of [facility] -- Must have NO insurance coverage whatsoever (no Medicaid/ Medicare, etc.) -- The pt. MUST have a primary care doctor that directs their care regularly and follows them in the community   [facility]  [phone]   [employer]  [phone]    Agencies that provide inexpensive medical care: Organization         Address  Phone   Notes  [place]  [phone]   [facility]    [phone]   [facility] [place], [place] [address] [phone]   [place] [address], [place] [phone]   [facility]anned [facility]    [phone]   [facility]    [phone]   [place] and [address], [place] Phone:  [phone], Fax:  [phone] Hours of Operation:  9 am - 6 pm, M-F.  Also accepts Medicaid/Medicare and self-pay.  [facility] for [place], [address], [place] Phone: [phone], Fax: [phone]. Hours of Operation:  8:30 am - 5:30 pm, M-F.  Also accepts Medicaid and self-pay.  [facility] [place] [address], [place] Phone: [phone]   [place], [place], [place] [phone], Ext. [phone] Mondays & Thursdays: 7-9 AM.  First 15 patients are seen on a first come, first serve basis.    [place] Providers:  Organization         Address  Phone   Notes  [facility] [address], [place] [phone] Also accepts self-pay patients.  [facility] [address], [place]  [place]  [phone]   [place], [address], [place] [phone]   [place] [address] [place] [phone]   Lucianne Lei [address], [place]   [phone] Only accepts [place] [facility] [place] patients after they have their name applied to their card.   Self-Pay (no insurance) in [facility]:  Organization         Address  Phone   Notes  Sickle Cell Patients, [facility] [place] [phone]   [facility] [place] [phone]   [facility] [place]  [place], [place], [place] [phone]   [facility]/Dr. Osei-Bonsu  [address], [place] or [address], [place] [phone] Phone number for both [place] and [place] locations is the same.  Urgent [facility] and [facility] [address] [place] [phone]   [facility] [address], [place] or [address] [phone]   [facility] [address], [place] [phone], phone; [phone], fax Sees patients 1st and 3rd Saturday of every month.  Must not qualify for public or private insurance (i.e. Medicaid, Medicare, [place] Health Choice, Veterans' Benefits)  Household income should be no more than 200% of the poverty level The clinic cannot treat you if you are pregnant or think you are pregnant  Sexually transmitted diseases are not treated at the clinic.

## 2018-09-15 NOTE — ED Provider Notes (Signed)
[facility] DEPT Provider Note   CSN: [id number] Arrival date & time: 09/15/18  1800   History   Chief Complaint Chief Complaint  Patient presents with  . Fall  . Fatigue  . Arm Pain    HPI Hannah Khan is a 62 y.o. female with a PMH of CHF, thrombocytopenia, cirrhosis, and atrial fibrillation presents after a fall at 1:30pm today. Patient reports she was using the bathroom and fell forward. Patient is unsure if she hit her head, but denies LOC. Patient states she hit her right arm when she fell and reports constant right arm pain without radiation. Patient reports intermittent paresthesias, but denies numbness on the right arm.  Patient reports she had a fracture on 09/2016 on her right humerus, but did not have surgery. She is being followed by Dr. Marcelino Scot regarding her right humerus fracture, which patient states did not completely heal according to the last MRI. Patient reports diffuse weakness and dizziness. Husband is a contributing historian and reports patient has not been at baseline since her fall today. Husband is concerned because patient is not always able to answer questions and appears confused at times. Husband reports patient is also walking slower than usual and has slight edema on the right arm. Patient reports she had a colonoscopy on December 18th and had a platelet transfusion due to her thrombocytopenia. Patient states she had a few episodes of diarrhea over a week ago, but states symptoms have completely resolved. Patient denies taking anything for her symptoms. Patient denies nausea, vomiting, or abdominal pain.   HPI  Past Medical History:  Diagnosis Date  . Anemia    hx of  . Arthritis    knees  . Diabetes mellitus without complication ([place])    type 2  . Fibromyalgia   . HTN (hypertension)   . Hx of colonic polyps    s/p partial colectomy  . Hyperlipidemia   . Joint pain    in knees and back spasms  . Left leg swelling    wear compression hose  . Liver cirrhosis secondary to NASH (nonalcoholic steatohepatitis) ([place]) dx nov 2014   had enlarged spleen also   . Low blood pressure    bp varies  . Morbid obesity ([place])   . Persistent atrial fibrillation   . Pneumonia 10/2016  . PONV (postoperative nausea and vomiting)    in past none recent  . Portal hypertension ([place])   . Sleep apnea    uses cpap setting of 14  . Spleen enlarged   . 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 [facility])     Patient Active Problem List   Diagnosis Date Noted  . Atrial fibrillation with RVR ([place]) 04/26/2018  . UTI (urinary tract infection) 04/26/2018  . Liver cirrhosis secondary to NASH (nonalcoholic steatohepatitis) ([place]) 04/26/2018  . Hyperlipidemia 04/26/2018  . Type 2 diabetes mellitus with other specified complication ([place])   . Cellulitis of right lower extremity 03/15/2018  . Severe sepsis ([place]) 03/14/2018  . Acute hypoxemic respiratory failure ([place]) 11/01/2016  . Community acquired pneumonia of right lower lobe of lung ([place]) 10/31/2016  . Muscular abdominal pain in right upper quadrant 07/11/2016  . Chronic diastolic CHF (congestive heart failure) ([place]) 07/10/2016  . Thrombocytopenia ([place]) 07/10/2016  . Permanent atrial fibrillation 01/30/2016  . Type 2 diabetes mellitus without complication, without long-term current use of insulin ([place]) 01/30/2016  . Acute on chronic combined systolic and diastolic CHF (congestive heart failure) ([place]) 01/26/2016  . Morbid  obesity- 12/10/2015  . Atrial fibrillation with rapid ventricular response ([place]) 12/08/2015  . Melena 01/31/2015  . Aortic heart murmur 01/31/2015  . Cirrhosis of liver, with portal HTN & Acites, by CT Abdomen 07/20/13 07/23/2013  . Pancytopenia ([place]) 07/23/2013  . Obstructive sleep apnea 08/07/2007  . Essential hypertension 08/07/2007  . ALLERGIC RHINITIS 08/07/2007    Past Surgical History:  Procedure Laterality Date  . BREAST LUMPECTOMY WITH RADIOACTIVE SEED  LOCALIZATION Right 08/29/2017   Procedure: RIGHT BREAST LUMPECTOMY WITH RADIOACTIVE SEEDS LOCALIZATION;  Surgeon: Erroll Luna, MD;  Location: [facility];  Service: General;  Laterality: Right;  . CESAREAN SECTION  1989  . CHOLECYSTECTOMY  20 yrs ago  . COLECTOMY  2011  . COLONOSCOPY    . COLONOSCOPY N/A 09/05/2018   Procedure: COLONOSCOPY;  Surgeon: [name], MD;  Location: [facility];  Service: Endoscopy;  Laterality: N/A;  . DILATATION & CURETTAGE/HYSTEROSCOPY WITH MYOSURE N/A 05/06/2016   Procedure: DILATATION & CURETTAGE/HYSTEROSCOPY WITH MYOSURE WITH POLYPECTOMY;  Surgeon: Janyth Pupa, DO;  Location: [facility];  Service: Gynecology;  Laterality: N/A;  . ESOPHAGOGASTRODUODENOSCOPY (EGD) WITH PROPOFOL N/A 09/04/2013   Procedure: ESOPHAGOGASTRODUODENOSCOPY (EGD) WITH PROPOFOL;  Surgeon: [name], MD;  Location: [facility];  Service: Endoscopy;  Laterality: N/A;  . KNEE ARTHROSCOPY  yrs ago   bilateral, one done x 1, one done twice  . POLYPECTOMY  09/05/2018   Procedure: POLYPECTOMY;  Surgeon: [name], MD;  Location: [facility];  Service: Endoscopy;;     OB History   No obstetric history on file.      Home Medications    Prior to Admission medications   Medication Sig Start Date End Date Taking? Authorizing Provider  bumetanide (BUMEX) 2 MG tablet Take 2-3 mg by mouth 2 (two) times daily as needed (fluid).     [provider]  calcium carbonate (OS-CAL - DOSED IN MG OF ELEMENTAL CALCIUM) 1250 (500 Ca) MG tablet Take 1 tablet by mouth daily with breakfast.     [provider]  cephALEXin (KEFLEX) 500 MG capsule Take 1 capsule (500 mg total) by mouth every 12 (twelve) hours for 5 days. 09/16/18 09/21/18  Darlin Drop P, PA-C  cyclobenzaprine (FLEXERIL) 10 MG tablet Take 10 mg by mouth 2 (two) times daily as needed for muscle spasms.     [provider]  diltiazem (CARDIZEM CD) 300 MG 24 hr capsule TAKE 1 CAPSULE BY MOUTH EVERY  DAY Patient taking differently: Take 300 mg by mouth daily. TAKE 1 CAPSULE BY MOUTH EVERY DAY 06/22/18   Skeet Latch, MD  ibuprofen (ADVIL,MOTRIN) 200 MG tablet Take 400 mg by mouth every 6 (six) hours as needed for moderate pain.    [provider]  JARDIANCE 25 MG TABS tablet Take 25 mg by mouth daily after breakfast.  01/28/17   [provider]  KLOR-CON M20 20 MEQ tablet Take 1 tablet (20 mEq total) by mouth daily. 03/27/18   Barrett, Evelene Croon, PA-C  pantoprazole (PROTONIX) 40 MG tablet Take 1 tablet (40 mg total) by mouth daily. 09/08/18 09/08/19  Regalado, Jerald Kief A, MD  propranolol (INDERAL) 10 MG tablet Take 1 tablet (10 mg total) by mouth 2 (two) times daily. 09/05/18   Skeet Latch, MD    Family History Family History  Problem Relation Age of Onset  . Heart failure Mother   . Cancer Mother   . Hyperlipidemia Mother   . Congestive Heart Failure Mother   . Stroke Mother   . Lung  cancer Father   . Cancer Father   . Cancer Brother   . Hyperlipidemia Brother   . Stroke Other     Social History Social History   Tobacco Use  . Smoking status: Former Smoker    Packs/day: 1.00    Years: 3.00    Pack years: 3.00    Types: Cigarettes    Last attempt to quit: 09/19/1974    Years since quitting: 44.0  . Smokeless tobacco: Never Used  Substance Use Topics  . Alcohol use: Yes    Comment: occ  . Drug use: No     Allergies   Celecoxib; Shellfish allergy; Barium-containing compounds; Prednisone; Statins; Fentanyl; Ibuprofen; Tramadol; and Tylenol [acetaminophen]   Review of Systems Review of Systems  Constitutional: Negative for chills, diaphoresis and fever.  Respiratory: Negative for cough and shortness of breath.   Cardiovascular: Negative for chest pain.  Gastrointestinal: Negative for abdominal pain, nausea and vomiting.  Endocrine: Negative for cold intolerance and heat intolerance.  Genitourinary: Negative for dysuria.  Musculoskeletal:  Positive for arthralgias, gait problem, joint swelling, myalgias and neck pain. Negative for back pain and neck stiffness.  Skin: Negative for rash.  Allergic/Immunologic: Negative for immunocompromised state.  Neurological: Positive for dizziness and weakness. Negative for syncope, speech difficulty, numbness and headaches.  Hematological: Negative for adenopathy.     Physical Exam Updated Vital Signs BP (!) 151/73 (BP Location: Other (Comment))   Pulse 86   Temp (!) 97.4 F (36.3 C) (Oral)   Resp 15   Ht 5' 7"  (1.702 m)   Wt (!) 160.2 kg   SpO2 98%   BMI 55.32 kg/m   Physical Exam Vitals signs and nursing note reviewed.  Constitutional:      General: She is not in acute distress.    Appearance: She is well-developed. She is not diaphoretic.  HENT:     Head: Normocephalic and atraumatic. No raccoon eyes.     Right Ear: Tympanic membrane, ear canal and external ear normal. No hemotympanum.     Left Ear: Tympanic membrane, ear canal and external ear normal. No hemotympanum.     Nose: Nose normal.     Mouth/Throat:     Mouth: Mucous membranes are moist.     Pharynx: No posterior oropharyngeal erythema.  Eyes:     Extraocular Movements: Extraocular movements intact.     Conjunctiva/sclera: Conjunctivae normal.     Pupils: Pupils are equal, round, and reactive to light.  Neck:     Musculoskeletal: Normal range of motion and neck supple. Spinous process tenderness and muscular tenderness present.  Cardiovascular:     Rate and Rhythm: Normal rate and regular rhythm.     Heart sounds: Normal heart sounds. No murmur. No friction rub. No gallop.   Pulmonary:     Effort: Pulmonary effort is normal. No respiratory distress.     Breath sounds: Normal breath sounds. No wheezing or rales.  Abdominal:     General: There is no distension.     Palpations: Abdomen is soft.  Musculoskeletal:     Right shoulder: She exhibits decreased range of motion, tenderness and bony tenderness.      Left shoulder: Normal. She exhibits normal range of motion, no tenderness and no bony tenderness.     Right elbow: She exhibits decreased range of motion and swelling.     Right wrist: Normal. She exhibits normal range of motion, no tenderness and no bony tenderness.     Right hip: Normal.  Left hip: Normal.     Right knee: Normal.     Left knee: Normal.     Cervical back: She exhibits tenderness and bony tenderness. She exhibits normal range of motion.       Arms:     Comments: No skin changes noted. Midline tenderness noted over the cervical spine and paraspinal muscles. Full active ROM of cervical spine.   Small abrasion noted over olecranon. Tenderness upon palpation of lateral elbow. Decreased ROM of right shoulder and elbow.  4/5 grip strength on right arm. 2+ radial pulses. Sensation intact.   Skin:    Findings: No erythema or rash.  Neurological:     Mental Status: She is alert.   Mental Status:  Alert and oriented to name and place. Not oriented to birth date. Thought content appropriate, able to give a coherent history. Speech fluent without evidence of aphasia. Not able to some follow 2 step commands. Cranial Nerves:  II:  Peripheral visual fields grossly normal, pupils equal, round, reactive to light III,IV, VI: ptosis not present, extra-ocular motions intact bilaterally  V,VII: smile symmetric, facial light touch sensation equal VIII: hearing grossly normal to voice  X: uvula elevates symmetrically  XI: bilateral shoulder shrug symmetric and strong XII: midline tongue extension without fassiculations Motor:  Normal tone. 5/5 in upper and lower extremities bilaterally including strong and equal grip strength and dorsiflexion/plantar flexion Sensory: light touch normal in all extremities.  Deep Tendon Reflexes: 2+ and symmetric in the biceps and patella Cerebellar: difficulty with finger-to-nose with bilateral upper extremities CV: distal pulses palpable throughout     ED Treatments / Results  Labs (all labs ordered are listed, but only abnormal results are displayed) Labs Reviewed  COMPREHENSIVE METABOLIC PANEL - Abnormal; Notable for the following components:      Result Value   Glucose, Bld 114 (*)    AST 42 (*)    Total Bilirubin 2.7 (*)    All other components within normal limits  URINALYSIS, ROUTINE W REFLEX MICROSCOPIC - Abnormal; Notable for the following components:   APPearance CLOUDY (*)    Glucose, UA >=500 (*)    Hgb urine dipstick SMALL (*)    Ketones, ur 5 (*)    Leukocytes, UA TRACE (*)    Bacteria, UA FEW (*)    All other components within normal limits  CBG MONITORING, ED - Abnormal; Notable for the following components:   Glucose-Capillary 104 (*)    All other components within normal limits  URINE CULTURE  CBC WITH DIFFERENTIAL/PLATELET  CBC WITH DIFFERENTIAL/PLATELET  AMMONIA  BRAIN NATRIURETIC PEPTIDE  I-STAT TROPONIN, ED   Hemoglobin  Date Value Ref Range Status  09/08/2018 12.3 12.0 - 15.0 g/dL Final  09/07/2018 12.4 12.0 - 15.0 g/dL Final  09/05/2018 11.6 (L) 12.0 - 15.0 g/dL Final  09/05/2018 12.3 12.0 - 15.0 g/dL Final  07/27/2018 12.7 12.0 - 15.0 g/dL Final   HGB  Date Value Ref Range Status  08/25/2017 12.6 11.6 - 15.9 g/dL Final  08/18/2017 13.0 11.6 - 15.9 g/dL Final  08/11/2017 13.0 11.6 - 15.9 g/dL Final  08/04/2017 12.6 11.6 - 15.9 g/dL Final    EKG EKG Interpretation  Date/Time:  Saturday September 15 2018 22:42:55 EST Ventricular Rate:  92 PR Interval:    QRS Duration: 98 QT Interval:  352 QTC Calculation: 436 R Axis:   -13 Text Interpretation:  Atrial fibrillation Low voltage, precordial leads Confirmed by Virgel Manifold [phone]) on 09/15/2018 11:02:38 PM  Radiology Dg Shoulder Right  Result Date: 09/15/2018 CLINICAL DATA:  Patient fell at home and has increasing pain in the right arm. EXAM: RIGHT SHOULDER - 2+ VIEW COMPARISON:  Right shoulder 09/28/2017 and CT 08/28/2018  FINDINGS: The AC and glenohumeral joints are intact without joint dislocation. Healing fracture of the proximal humeral diaphysis is partially included on this study. No acute fracture is apparent. The adjacent ribs and lung and scapula are nonacute. IMPRESSION: No acute osseous abnormality of the right shoulder. Healing known diaphyseal fracture of the proximal humerus. Electronically Signed   By: Ashley Royalty M.D.   On: 09/15/2018 20:41   Dg Elbow Complete Right  Result Date: 09/15/2018 CLINICAL DATA:  Elbow pain after fall. EXAM: RIGHT ELBOW - COMPLETE 3+ VIEW COMPARISON:  09/28/2017 shoulder radiographs and 12 10 19  CT right humerus FINDINGS: Partially included healing diaphyseal fracture of the right humerus with incomplete osseous union suggested. No new fracture is noted. No joint effusion is identified about the right elbow nor dislocation identified. IMPRESSION: Partially included healing diaphyseal fracture of the right humerus. No new fracture is identified. Electronically Signed   By: Ashley Royalty M.D.   On: 09/15/2018 20:43   Ct Head Wo Contrast  Result Date: 09/15/2018 CLINICAL DATA:  Lethargy and weakness. Recent platelet transfusion with diarrhea and nausea shortly thereafter. EXAM: CT HEAD WITHOUT CONTRAST CT CERVICAL SPINE WITHOUT CONTRAST TECHNIQUE: Multidetector CT imaging of the head and cervical spine was performed following the standard protocol without intravenous contrast. Multiplanar CT image reconstructions of the cervical spine were also generated. COMPARISON:  03/20/2018 FINDINGS: CT HEAD FINDINGS BRAIN: The ventricles and sulci are normal. No intraparenchymal hemorrhage, mass effect nor midline shift. No acute large vascular territory infarcts. Grey-white matter distinction is maintained. The basal ganglia are unremarkable. No abnormal extra-axial fluid collections. Basal cisterns are not effaced and midline. The brainstem and cerebellar hemispheres are without acute  abnormalities. VASCULAR: Unremarkable. SKULL/SOFT TISSUES: No skull fracture. No significant soft tissue swelling. ORBITS/SINUSES: The included ocular globes and orbital contents are normal.The mastoid air cells are clear. The included paranasal sinuses are well-aerated. OTHER: None. CT CERVICAL SPINE FINDINGS Alignment: Maintained cervical lordosis. Skull base and vertebrae: No acute fracture. No primary bone lesion or focal pathologic process. Soft tissues and spinal canal: No prevertebral fluid or swelling. No visible canal hematoma. Disc levels: Disc space narrowing C4-5, C5-6 and C6-7 with uncovertebral joint osteoarthritis. No significant central canal stenosis. Mild bilateral foraminal encroachment at C5-6 and C6-7 Upper chest: Negative. Other: None IMPRESSION: 1. No acute intracranial abnormality. 2. Cervical degenerative disc disease C4 through C7 with uncovertebral joint osteoarthritis and uncinate spurring. No acute osseous appearing abnormality. Electronically Signed   By: Ashley Royalty M.D.   On: 09/15/2018 20:54   Ct Cervical Spine Wo Contrast  Result Date: 09/15/2018 CLINICAL DATA:  Lethargy and weakness. Recent platelet transfusion with diarrhea and nausea shortly thereafter. EXAM: CT HEAD WITHOUT CONTRAST CT CERVICAL SPINE WITHOUT CONTRAST TECHNIQUE: Multidetector CT imaging of the head and cervical spine was performed following the standard protocol without intravenous contrast. Multiplanar CT image reconstructions of the cervical spine were also generated. COMPARISON:  03/20/2018 FINDINGS: CT HEAD FINDINGS BRAIN: The ventricles and sulci are normal. No intraparenchymal hemorrhage, mass effect nor midline shift. No acute large vascular territory infarcts. Grey-white matter distinction is maintained. The basal ganglia are unremarkable. No abnormal extra-axial fluid collections. Basal cisterns are not effaced and midline. The brainstem and cerebellar hemispheres are without acute abnormalities.  VASCULAR: Unremarkable.  SKULL/SOFT TISSUES: No skull fracture. No significant soft tissue swelling. ORBITS/SINUSES: The included ocular globes and orbital contents are normal.The mastoid air cells are clear. The included paranasal sinuses are well-aerated. OTHER: None. CT CERVICAL SPINE FINDINGS Alignment: Maintained cervical lordosis. Skull base and vertebrae: No acute fracture. No primary bone lesion or focal pathologic process. Soft tissues and spinal canal: No prevertebral fluid or swelling. No visible canal hematoma. Disc levels: Disc space narrowing C4-5, C5-6 and C6-7 with uncovertebral joint osteoarthritis. No significant central canal stenosis. Mild bilateral foraminal encroachment at C5-6 and C6-7 Upper chest: Negative. Other: None IMPRESSION: 1. No acute intracranial abnormality. 2. Cervical degenerative disc disease C4 through C7 with uncovertebral joint osteoarthritis and uncinate spurring. No acute osseous appearing abnormality. Electronically Signed   By: Ashley Royalty M.D.   On: 09/15/2018 20:54   Dg Humerus Right  Result Date: 09/15/2018 CLINICAL DATA:  Pain after fall EXAM: RIGHT HUMERUS - 2+ VIEW COMPARISON:  CT right humerus 08/28/2018 FINDINGS: Redemonstration of mid diaphyseal oblique slightly displaced chronic fracture of the right humerus with incomplete osseous union, similar to recent prior CT. No joint dislocation at the shoulder nor elbow. No acute osseous appearing abnormality is visualized. Soft tissue induration posterior to the elbow is identified along the distal arm. IMPRESSION: 1. Soft tissue contusion of the posterior arm. 2. Remote healing fracture of known diaphyseal fracture of the right humerus with slight 1/2 shaft width displacement. Electronically Signed   By: Ashley Royalty M.D.   On: 09/15/2018 20:46    Procedures Procedures (including critical care time)  Medications Ordered in ED Medications  sodium chloride 0.9 % bolus 500 mL (500 mLs Intravenous New Bag/Given  09/15/18 2251)  cephALEXin (KEFLEX) capsule 500 mg (500 mg Oral Given 09/16/18 0032)  cyclobenzaprine (FLEXERIL) tablet 10 mg (10 mg Oral Given 09/16/18 0032)     Initial Impression / Assessment and Plan / ED Course  I have reviewed the triage vital signs and the nursing notes.  Pertinent labs & imaging results that were available during my care of the patient were reviewed by me and considered in my medical decision making (see chart for details).  Clinical Course as of Sep 16 37  Sat Sep 15, 2018  2127 No acute intracranial abnormality noted on CT.  CT Head Wo Contrast [AH]  2128 No acute osseous appearing abnormality noted on cervical spine.    CT Cervical Spine Wo Contrast [AH]  2129 Partially included healing diaphyseal fracture of the right humerus. No new fracture is identified.    DG Elbow Complete Right [AH]  2129 No acute osseous abnormality of the right shoulder. Healing known diaphyseal fracture of the proximal humerus.    DG Shoulder Right [AH]  2130 Soft tissue contusion of the posterior arm. Remote healing fracture of known diaphyseal fracture of the right humerus with slight 1/2 shaft width displacement.     [AH]  2158 UA reveals leukocytes, WBCs, and bacteria consistent with a UTI. Ordered a urine culture.  Leukocytes, UA(!): TRACE [AH]  2328 Patient reports symptoms have improved while in the ER.    [AH]  2344 Total bilirubin has significantly improved since 12/21  Total Bilirubin(!): 2.7 [AH]  Sun Sep 16, 2018  0010 Husband reports patient is back to baseline.    [AH]    Clinical Course User Index [AH] Arville Lime, PA-C   Patient presents after a fall. Radiology reveals a soft tissue contusion of the posterior right arm, but does not reveal  an acute osseous abnormality. Healing fracture of known diaphyseal fracture is noted on x ray. Suspect confusion is likely due to UTI. Pt has been diagnosed with a UTI based on UA. Urine culture was ordered. Pt  is afebrile, no CVA tenderness, normotensive, and denies N/V. Pt to be dc home with antibiotics for UTI if labs are within normal limits. Labs are pending. Instructed patient to follow up with PCP and orthopedics in 2 days.  Findings and plan of care discussed with supervising physician Dr. Wilson Singer.  At shift change care was transferred to [facility] PA-C who will follow pending studies, re-evaluate and determine disposition.    Final Clinical Impressions(s) / ED Diagnoses   Final diagnoses:  Fall, initial encounter  Right arm pain  Acute cystitis with hematuria    ED Discharge Orders         Ordered    cephALEXin (KEFLEX) 500 MG capsule  Every 12 hours     09/16/18 0034           Arville Lime, PA-C 09/16/18 0038    Virgel Manifold, MD 09/17/18 1524

## 2018-09-15 NOTE — ED Triage Notes (Signed)
Patient recently had a platelet transfusing and has had diarrhea and nausea shortly there after. She complains of lethargy/ weakness and tarry stools for a day. She fell at home and now has increased pain in the right arm. Patient's family member believes her mentation is not at baseline.  BP- 148/58 HR- 120 Sats- 98% on room air 201 CBG

## 2018-09-16 LAB — CBC WITH DIFFERENTIAL/PLATELET
Abs Immature Granulocytes: 0.02 10*3/uL (ref 0.00–0.07)
Basophils Absolute: 0 10*3/uL (ref 0.0–0.1)
Basophils Relative: 1 %
Eosinophils Absolute: 0.1 10*3/uL (ref 0.0–0.5)
Eosinophils Relative: 2 %
HCT: 39.1 % (ref 36.0–46.0)
Hemoglobin: 12.4 g/dL (ref 12.0–15.0)
Immature Granulocytes: 0 %
LYMPHS PCT: 22 %
Lymphs Abs: 1 10*3/uL (ref 0.7–4.0)
MCH: 29.8 pg (ref 26.0–34.0)
MCHC: 31.7 g/dL (ref 30.0–36.0)
MCV: 94 fL (ref 80.0–100.0)
Monocytes Absolute: 0.4 10*3/uL (ref 0.1–1.0)
Monocytes Relative: 8 %
Neutro Abs: 3.1 10*3/uL (ref 1.7–7.7)
Neutrophils Relative %: 67 %
Platelets: 48 10*3/uL — ABNORMAL LOW (ref 150–400)
RBC: 4.16 MIL/uL (ref 3.87–5.11)
RDW: 16.1 % — ABNORMAL HIGH (ref 11.5–15.5)
WBC: 4.6 10*3/uL (ref 4.0–10.5)
nRBC: 0 % (ref 0.0–0.2)

## 2018-09-16 LAB — I-STAT TROPONIN, ED: Troponin i, poc: 0 ng/mL (ref 0.00–0.08)

## 2018-09-16 LAB — BRAIN NATRIURETIC PEPTIDE: B Natriuretic Peptide: 153.6 pg/mL — ABNORMAL HIGH (ref 0.0–100.0)

## 2018-09-16 LAB — AMMONIA: AMMONIA: 35 umol/L (ref 9–35)

## 2018-09-16 MED ORDER — CEPHALEXIN 500 MG PO CAPS: 500.0000 mg | ORAL_CAPSULE | Freq: Two times a day (BID) | ORAL | 0 refills | Status: DC

## 2018-09-16 NOTE — ED Notes (Signed)
[place] IV placed and ALL blood redrawn and sent to lab.

## 2018-09-21 ENCOUNTER — Ambulatory Visit: Payer: BC Managed Care – PPO | Admitting: Cardiovascular Disease

## 2018-09-21 ENCOUNTER — Encounter: Payer: Self-pay | Admitting: Cardiovascular Disease

## 2018-09-21 VITALS — BP 114/62 | HR 102 | Ht 67.0 in | Wt 341.0 lb

## 2018-09-21 DIAGNOSIS — I4819 Other persistent atrial fibrillation: Secondary | ICD-10-CM | POA: Diagnosis not present

## 2018-09-21 DIAGNOSIS — I5033 Acute on chronic diastolic (congestive) heart failure: Secondary | ICD-10-CM

## 2018-09-21 DIAGNOSIS — G4733 Obstructive sleep apnea (adult) (pediatric): Secondary | ICD-10-CM

## 2018-09-21 DIAGNOSIS — Z9989 Dependence on other enabling machines and devices: Secondary | ICD-10-CM

## 2018-09-21 NOTE — Progress Notes (Signed)
Cardiology Office Note   Date:  09/21/2018   ID:  Hannah Khan, DOB [birth year], MRN [id number]  PCP:  Lujean Amel, MD  Cardiologist:   Skeet Latch, MD  Dr. Paulita Fujita: GI Dr. Alen Blew: Hematology  Chief Complaint  Patient presents with  . Follow-up    pt was in hospital after colonoscopy procedure for rapid heart rate      Patient ID:  Hannah Khan is a 63 y.o. female with hypertension, chronic atrial fibrillation, diabetes mellitus type 2, hyperlipidemia, cryptogenic cirrhosis, thrombocytopenia, and morbid obesity who presents for follow-up.  Hannah Khan was admitted to the hospital 11/2015 with atrial fibrillation with rapid ventricular response. Given her history of portal hypertension and cirrhosis she was not anticoagulated. She was started on diltiazem for rate control and continued on her home propranolol. Her rate did not stay consistently less than 100 bpm so she was started on digoxin as well.  Echo revealed normal systolic function and her thyroid function was normal.  She was admitted to the hospital 11/4194 with diastolic heart failure.  She was diuresed with IV lasix and digoxin was discontinued due to hypokalemia.  She was seen in follow up 02/2016, at which time she complained of hypotension.  Her heart rate was well-controlled but she continued to feel poorly in AF.  She was referred to Dr. Rayann Heman for consideration of a Watchman device however, he felt that she would not be a good candidate, as she will only be able to be on antiplatelets for one month and she is unlikely to remain in sinus rhythm without significant weight loss.   Hannah Khan fell and broke her R arm 09/2017.  She was also hospitalized July and August 2019 with cellulitis.  During that hospitalization she had atrial fibrillation with rapid ventricular response.  She was initially treated with a diltiazem drip and then transitioned back to oral diltiazem.  Since hospitalization she has struggled with lower  extremity edema. She notes a variable response to her Bumex.  Bumex was increased to 3 mg twice daily.  However she thought she was supposed be taking it 2 mg 3 times a day and was unable to do so because of schedule issues.  She continued to take it 2 mg twice daily.  She remains in atrial fibrillation and her rate was poorly controlled so digoxin was added to her regimen.  Her heart rate has been mostly in the 80s to 90s.  However, she had a headache on digoxin so it was discontinued.  She got a new CPAP machine and has been using it regularly.    Since her last appointment [name] was admitted 08/2018 with atrial fibrillation with RVR.  Her heart rate was 160 at home and improved to 135 with her medication.  She was given adenosine and then started on a diltiazem drip.  BNP was 266 and troponin was negative.  It was thought that the episode was triggered by dehydration from vomiting after colonoscopy and possibly viral gatroenteritis.  She had constant diarrhea after the procedure.  She was transitioned back to oral diltiazem.  She was then seen in the ED one week later after a fall.  She was coming out of the bathroom and got her feet tangles up.  There were no preceding symptoms.  SInce then she has been feeling OK physically.  She has been stressed because she started back at school.  Her heart rate is back to baseline.  Her weight  is down to 341, which she attributes to being able to take her lasix more regularly while on Christmas break.   She still has some soreness in her knees from her fall.   Past Medical History:  Diagnosis Date  . Anemia    hx of  . Arthritis    knees  . Diabetes mellitus without complication ([place])    type 2  . Fibromyalgia   . HTN (hypertension)   . Hx of colonic polyps    s/p partial colectomy  . Hyperlipidemia   . Joint pain    in knees and back spasms  . Left leg swelling    wear compression hose  . Liver cirrhosis secondary to NASH (nonalcoholic  steatohepatitis) (Hannah Khan) dx nov 2014   had enlarged spleen also   . Low blood pressure    bp varies  . Morbid obesity (Hannah Khan)   . Persistent atrial fibrillation   . Pneumonia 10/2016  . PONV (postoperative nausea and vomiting)    in past none recent  . Portal hypertension (Hannah Khan)   . Sleep apnea    uses cpap setting of 14  . Spleen enlarged   . 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 [facility])     Past Surgical History:  Procedure Laterality Date  . BREAST LUMPECTOMY WITH RADIOACTIVE SEED LOCALIZATION Right 08/29/2017   Procedure: RIGHT BREAST LUMPECTOMY WITH RADIOACTIVE SEEDS LOCALIZATION;  Surgeon: Erroll Luna, MD;  Location: [facility];  Service: General;  Laterality: Right;  . CESAREAN SECTION  1989  . CHOLECYSTECTOMY  20 yrs ago  . COLECTOMY  2011  . COLONOSCOPY    . COLONOSCOPY N/A 09/05/2018   Procedure: COLONOSCOPY;  Surgeon: [name], MD;  Location: [facility];  Service: Endoscopy;  Laterality: N/A;  . DILATATION & CURETTAGE/HYSTEROSCOPY WITH MYOSURE N/A 05/06/2016   Procedure: DILATATION & CURETTAGE/HYSTEROSCOPY WITH MYOSURE WITH POLYPECTOMY;  Surgeon: Janyth Pupa, DO;  Location: [facility];  Service: Gynecology;  Laterality: N/A;  . ESOPHAGOGASTRODUODENOSCOPY (EGD) WITH PROPOFOL N/A 09/04/2013   Procedure: ESOPHAGOGASTRODUODENOSCOPY (EGD) WITH PROPOFOL;  Surgeon: [name], MD;  Location: [facility];  Service: Endoscopy;  Laterality: N/A;  . KNEE ARTHROSCOPY  yrs ago   bilateral, one done x 1, one done twice  . POLYPECTOMY  09/05/2018   Procedure: POLYPECTOMY;  Surgeon: [name], MD;  Location: [facility];  Service: Endoscopy;;     Current Outpatient Medications  Medication Sig Dispense Refill  . bumetanide (BUMEX) 2 MG tablet Take 2-3 mg by mouth 2 (two) times daily as needed (fluid).     . calcium carbonate (OS-CAL - DOSED IN MG OF ELEMENTAL CALCIUM) 1250 (500 Ca) MG tablet Take 1 tablet by mouth daily with breakfast.     . cyclobenzaprine (FLEXERIL) 10  MG tablet Take 10 mg by mouth 2 (two) times daily as needed for muscle spasms.     Marland Kitchen diltiazem (CARDIZEM CD) 300 MG 24 hr capsule TAKE 1 CAPSULE BY MOUTH EVERY DAY (Patient taking differently: Take 300 mg by mouth daily. TAKE 1 CAPSULE BY MOUTH EVERY DAY) 90 capsule 3  . ibuprofen (ADVIL,MOTRIN) 200 MG tablet Take 400 mg by mouth every 6 (six) hours as needed for moderate pain.    Marland Kitchen JARDIANCE 25 MG TABS tablet Take 25 mg by mouth daily after breakfast.   5  . KLOR-CON M20 20 MEQ tablet Take 1 tablet (20 mEq total) by mouth daily. 90 tablet 3  . pantoprazole (PROTONIX) 40 MG tablet Take 1 tablet (40 mg total) by mouth  daily. 30 tablet 1  . propranolol (INDERAL) 10 MG tablet Take 1 tablet (10 mg total) by mouth 2 (two) times daily. 180 tablet 1   No current facility-administered medications for this visit.     Allergies:   Celecoxib; Shellfish allergy; Barium-containing compounds; Prednisone; Statins; Fentanyl; Ibuprofen; Tramadol; and Tylenol [acetaminophen]    Social History:  The patient  reports that she quit smoking about 44 years ago. Her smoking use included cigarettes. She has a 3.00 pack-year smoking history. She has never used smokeless tobacco. She reports current alcohol use. She reports that she does not use drugs.   Family History:  [name] family history includes Cancer in her brother, father, and mother; Congestive Heart Failure in her mother; Heart failure in her mother; Hyperlipidemia in her brother and mother; Lung cancer in her father; Stroke in her mother and another family member.    ROS:  Please see the history of present illness.   Otherwise, review of systems are positive for none.   All other systems are reviewed and negative.    PHYSICAL EXAM: VS:  BP 114/62   Pulse (!) 102   Ht 5' 7"  (1.702 m)   Wt (!) 341 lb (154.7 kg)   BMI 53.41 kg/m  , BMI Body mass index is 53.41 kg/m. GENERAL:  Chronically ill-appearing HEENT: Pupils equal round and reactive, fundi  not visualized, oral mucosa unremarkable NECK:  No jugular venous distention, waveform within normal limits, carotid upstroke brisk and symmetric, no bruits, no thyromegaly LUNGS:  Clear to auscultation bilaterally HEART:  Irregularly irregular.  PMI not displaced or sustained,S1 and S2 within normal limits, no S3, no S4, no clicks, no rubs, no murmurs ABD:  Flat, positive bowel sounds normal in frequency in pitch, no bruits, no rebound, no guarding, no midline pulsatile mass, no hepatomegaly, no splenomegaly EXT:  2 plus pulses throughout, 2+ LE edema, no cyanosis no clubbing SKIN:  No rashes no nodules NEURO:  Cranial nerves II through XII grossly intact, motor grossly intact throughout PSYCH:  Cognitively intact, oriented to person place and time   EKG:  EKG is not ordered today.  06/06/16: Atrial fibrillation. Rate 93 bpm. Low voltage. 11/14/16: Atrial fibrillation. Rate 87 bpm. 02/07/79 atrial fibrillation. Rate 95 bpm. Low voltage precordial leads. Incomplete right bundle branch block. 06/06/17: Atrial fibrillation.  Rate 76 bpm.   06/22/2018: Atrial fibrillation.  Rate 84 bpm.  Incomplete right bundle branch block.  Low voltage.  LAFB.  Echo 12/10/15: Study Conclusions  - Procedure narrative: Transthoracic echocardiography. The study  was technically difficult, as a result of body habitus.  Intravenous contrast (Definity) was administered. - Left ventricle: The cavity size was normal. Wall thickness was  increased in a pattern of mild LVH. Systolic function was normal.  The estimated ejection fraction was in the range of 55% to 60%.  Wall motion was normal; there were no regional wall motion  abnormalities. The study is not technically sufficient to allow  evaluation of LV diastolic function. - Mitral valve: Mildly thickened leaflets . There was trivial  regurgitation. - Left atrium: The atrium was mildly dilated. - Inferior vena cava: The vessel was dilated. The  respirophasic  diameter changes were blunted (< 50%), consistent with elevated  central venous pressure. - Pericardium, extracardiac: A trivial pericardial effusion was  identified posterior to the heart.  Impressions:  - Technically difficult study. The patient was in atrial flutter  during the study. The LVEF is 55-60%, mild LVH, grossly  normal  wall motion, trivial MR, mild LAE, dilated IVC, trivial posterior  pericardial effusion.  Recent Labs: 04/26/2018: Magnesium 1.9 06/08/2018: NT-Pro BNP 253 09/15/2018: ALT 24; BUN 13; Creatinine, Ser 0.62; Potassium 4.4; Sodium 142 09/16/2018: B Natriuretic Peptide 153.6; Hemoglobin 12.4; Platelets 48    Lipid Panel    Component Value Date/Time   CHOL 175 07/02/2014 1138   TRIG 93 07/02/2014 1138   HDL 59 07/02/2014 1138   CHOLHDL 3.0 07/02/2014 1138   VLDL 19 07/02/2014 1138   LDLCALC 97 07/02/2014 1138      Wt Readings from Last 3 Encounters:  09/21/18 (!) 341 lb (154.7 kg)  09/15/18 (!) 353 lb 2.8 oz (160.2 kg)  09/07/18 (!) 353 lb 2.8 oz (160.2 kg)     ASSESSMENT AND PLAN:  # Persistent atrial fibrillation: Rate has been controlled on her current dose of diltiazem.  She was admitted with afib with RVR in the setting of intravascular volume depletion.  HR better controlled on digoxin but it was stopped 2/2 headaches.  Lately HR has been better controlled.  She is not on anticoagulation due to high risk of GI bleed and cirrhosis.   Continue diltiazem and propranolol.  This patients CHA2DS2-VASc Score and unadjusted Ischemic Stroke Rate (% per year) is equal to 3.2 % stroke rate/year from a score of 3 Above score calculated as 1 point each if present [CHF, HTN, DM, Vascular=MI/PAD/Aortic Plaque, Age if 65-74, or Female] Above score calculated as 2 points each if present [Age > 75, or Stroke/TIA/TE]  # Chronic diastolic heart failure: Her weight is down now that she has been able to take Bumex regularly on the holiday.  We  discussed the importance of continuing to take it while in school.  She remains volume overloaded and would feel better on a higher dose but this doesn't work with her work schedule.  # OSA: Continue CPAP.     Current medicines are reviewed at length with the patient today.  The patient does not have concerns regarding medicines.   The following changes have been made:  none  Labs/ tests ordered today include:   No orders of the defined types were placed in this encounter.     Disposition:   FU with Bryor Rami C. Oval Linsey, MD, [facility] in 1 month.    Signed, Lianne Carreto C. Oval Linsey, MD, [facility]  09/21/2018 8:21 AM    [place]

## 2018-09-21 NOTE — Patient Instructions (Signed)
Medication Instructions:  NO CHANGE If you need a refill on your cardiac medications before your next appointment, please call your pharmacy.   Lab work: If you have labs (blood work) drawn today and your tests are completely normal, you will receive your results only by: Marland Kitchen MyChart Message (if you have MyChart) OR . A paper copy in the mail If you have any lab test that is abnormal or we need to change your treatment, we will call you to review the results.  Follow-Up: At [facility], you and your health needs are our priority.  As part of our continuing mission to provide you with exceptional heart care, we have created designated Provider Care Teams.  These Care Teams include your primary Cardiologist (physician) and Advanced Practice Providers (APPs -  Physician Assistants and Nurse Practitioners) who all work together to provide you with the care you need, when you need it.  Your physician recommends that you schedule a follow-up appointment in: AS SCHEDULED

## 2018-11-01 ENCOUNTER — Encounter: Payer: Self-pay | Admitting: Cardiovascular Disease

## 2018-11-01 ENCOUNTER — Other Ambulatory Visit: Payer: Self-pay

## 2018-11-01 ENCOUNTER — Ambulatory Visit: Payer: BC Managed Care – PPO | Admitting: Cardiovascular Disease

## 2018-11-01 VITALS — BP 119/62 | HR 86 | Ht 67.0 in | Wt 359.0 lb

## 2018-11-01 DIAGNOSIS — I4819 Other persistent atrial fibrillation: Secondary | ICD-10-CM | POA: Diagnosis not present

## 2018-11-01 DIAGNOSIS — E1169 Type 2 diabetes mellitus with other specified complication: Secondary | ICD-10-CM

## 2018-11-01 DIAGNOSIS — G4733 Obstructive sleep apnea (adult) (pediatric): Secondary | ICD-10-CM

## 2018-11-01 DIAGNOSIS — I5033 Acute on chronic diastolic (congestive) heart failure: Secondary | ICD-10-CM

## 2018-11-01 DIAGNOSIS — Z9989 Dependence on other enabling machines and devices: Secondary | ICD-10-CM

## 2018-11-01 DIAGNOSIS — I10 Essential (primary) hypertension: Secondary | ICD-10-CM

## 2018-11-01 NOTE — Patient Instructions (Signed)
Medication Instructions:  The current medical regimen is effective;  continue present plan and medications.  If you need a refill on your cardiac medications before your next appointment, please call your pharmacy.    Follow-Up: At [facility], you and your health needs are our priority.  As part of our continuing mission to provide you with exceptional heart care, we have created designated Provider Care Teams.  These Care Teams include your primary Cardiologist (physician) and Advanced Practice Providers (APPs -  Physician Assistants and Nurse Practitioners) who all work together to provide you with the care you need, when you need it. . Continue follow up's with Dr.[place]  . Follow up with Dr.Kelly (sleep) as needed.

## 2018-11-01 NOTE — Progress Notes (Signed)
Cardiology Office Note    Date:  11/01/2018   ID:  Hannah Khan, DOB January 14, 1956, MRN [id number]  PCP:  Lujean Amel, MD  Cardiologist:  Shelva Majestic, MD (sleep); Dr. Skeet Latch  Chief Complaint  Patient presents with  . Follow-up   F/U sleep evaluation  History of Present Illness:  Hannah Khan is a 63 y.o. female who was referred by Dr. Oval Linsey for initial sleep evaluation November 2019.  She now presents for follow-up evaluation following initiation of CPAP therapy.  [name] is a 63 year old female who has a history of super morbid obesity, hypertension, chronic atrial fibrillation, type 2 diabetes mellitus, hyperlipidemia, cryptogenic cirrhosis, and thrombocytopenia.  She has a long history of sleep apnea which dates back to at least 2003 when she underwent an initial sleep study at [facility] and she was found to have severe sleep apnea with an AHI of 45/h.  She has been on CPAP therapy since that time.  She initially was started with CPAP of only 7 cm water pressure but ultimately over the years this has been increased to 14 cm.  She has not been evaluated for sleep since 2008.  The years her weight has increased.  Only she had had issues with head congestion with CPAP use.  Is only, she admits to 100% compliance.  Her first CPAP machine lasted 7 years and her current machine is 63 years old and is an Nurse, children's.  A download was obtained  from July 04, 2018 through August 02, 2018 reveals 100% compliance.  She is averaging 6 hours and 27 minutes of sleep per night.  She has had a 15 cm water pressure and AHI is excellent at 0.7.  Typically she goes to bed between 11 and 1130 and wakes up at 6 AM.  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  She uses nasal pillows.  She broke her arm in January to sleep in a recliner until May.  She has noticed some hospital irritation from the nasal pillows.   She admits to being sleepy.  She is unaware of breakthrough snoring.  She is unaware of bruxism, restless legs, hypnogognic hallucinations, or cataplexy.  An Epworth Sleepiness Scale score was calculated the day of her initial evaluation as shown below:  Epworth Sleepiness Scale: Situation   Chance of Dozing/Sleeping (0 = never , 1 = slight chance , 2 = moderate chance , 3 = high chance )   sitting and reading 2   watching TV 2   sitting inactive in a public place 0   being a passenger in a motor vehicle for an hour or more 3   lying down in the afternoon 3   sitting and talking to someone 0   sitting quietly after lunch (no alcohol) 1   while stopped for a few minutes in traffic as the driver 0   Total Score  11   She also had some issues with acute on chronic diastolic heart failure and has persistent atrial fibrillation.  She felt that she was not well rested and had concerns regarding her old CPAP machine functioning properly.    When I initially saw her, a download obtained at that time she did to show excellent compliance.  Her machine was 63 years old and was started to have some issues.  Ultimately qualified for a new machine and received a ResMed AiirSense 10 auto  set CPAP unit on August 22, 2018.  She has noted significant improvement with her new machine.  She now has a ResMed air fit N 39 mask.  Typically she goes to bed between midnight and 1 AM on weekends and between 1130 and midnight on weekdays and typically gets up between 630 and 7.  Download was obtained in the office today from October 01, 2018 through October 30, 2018.  She is 100% compliant with CPAP use and has been averaging 6 hours and 30 minutes.  She has been set at a minimum pressure of 12 with a maximum pressure of 20.  Her 95th percentile pressure is 13.7 with a maximum average pressure at 14.4.  She does have some mask leak.  AHI is excellent at 1.5.  For initial evaluation following receiving her new CPAP  machine.  Past Medical History:  Diagnosis Date  . Anemia    hx of  . Arthritis    knees  . Diabetes mellitus without complication ([place])    type 2  . Fibromyalgia   . HTN (hypertension)   . Hx of colonic polyps    s/p partial colectomy  . Hyperlipidemia   . Joint pain    in knees and back spasms  . Left leg swelling    wear compression hose  . Liver cirrhosis secondary to NASH (nonalcoholic steatohepatitis) ([place]) dx nov 2014   had enlarged spleen also   . Low blood pressure    bp varies  . Morbid obesity ([place])   . Persistent atrial fibrillation   . Pneumonia 10/2016  . PONV (postoperative nausea and vomiting)    in past none recent  . Portal hypertension ([place])   . Sleep apnea    uses cpap setting of 14  . Spleen enlarged   . 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 [facility])     Past Surgical History:  Procedure Laterality Date  . BREAST LUMPECTOMY WITH RADIOACTIVE SEED LOCALIZATION Right 08/29/2017   Procedure: RIGHT BREAST LUMPECTOMY WITH RADIOACTIVE SEEDS LOCALIZATION;  Surgeon: Erroll Luna, MD;  Location: [place];  Service: General;  Laterality: Right;  . CESAREAN SECTION  1989  . CHOLECYSTECTOMY  20 yrs ago  . COLECTOMY  2011  . COLONOSCOPY    . COLONOSCOPY N/A 09/05/2018   Procedure: COLONOSCOPY;  Surgeon: [name], MD;  Location: [facility];  Service: Endoscopy;  Laterality: N/A;  . DILATATION & CURETTAGE/HYSTEROSCOPY WITH MYOSURE N/A 05/06/2016   Procedure: DILATATION & CURETTAGE/HYSTEROSCOPY WITH MYOSURE WITH POLYPECTOMY;  Surgeon: Janyth Pupa, DO;  Location: [facility];  Service: Gynecology;  Laterality: N/A;  . ESOPHAGOGASTRODUODENOSCOPY (EGD) WITH PROPOFOL N/A 09/04/2013   Procedure: ESOPHAGOGASTRODUODENOSCOPY (EGD) WITH PROPOFOL;  Surgeon: [name], MD;  Location: [facility];  Service: Endoscopy;  Laterality: N/A;  . KNEE ARTHROSCOPY  yrs ago   bilateral, one done x 1, one done twice  . POLYPECTOMY  09/05/2018   Procedure: POLYPECTOMY;   Surgeon: [name], MD;  Location: [facility];  Service: Endoscopy;;    Current Medications: Outpatient Medications Prior to Visit  Medication Sig Dispense Refill  . bumetanide (BUMEX) 2 MG tablet Take 2-3 mg by mouth 2 (two) times daily as needed (fluid).     . calcium carbonate (OS-CAL - DOSED IN MG OF ELEMENTAL CALCIUM) 1250 (500 Ca) MG tablet Take 1 tablet by mouth daily with breakfast.     . diltiazem (CARDIZEM CD) 300 MG 24 hr capsule TAKE 1 CAPSULE BY MOUTH EVERY DAY (Patient taking differently: Take 300 mg  by mouth daily. TAKE 1 CAPSULE BY MOUTH EVERY DAY) 90 capsule 3  . JARDIANCE 25 MG TABS tablet Take 25 mg by mouth daily after breakfast.   5  . KLOR-CON M20 20 MEQ tablet Take 1 tablet (20 mEq total) by mouth daily. 90 tablet 3  . propranolol (INDERAL) 10 MG tablet Take 1 tablet (10 mg total) by mouth 2 (two) times daily. 180 tablet 1  . cyclobenzaprine (FLEXERIL) 10 MG tablet Take 10 mg by mouth 2 (two) times daily as needed for muscle spasms.     Marland Kitchen ibuprofen (ADVIL,MOTRIN) 200 MG tablet Take 400 mg by mouth every 6 (six) hours as needed for moderate pain.    . pantoprazole (PROTONIX) 40 MG tablet Take 1 tablet (40 mg total) by mouth daily. 30 tablet 1   No facility-administered medications prior to visit.      Allergies:   Celecoxib; Shellfish allergy; Barium-containing compounds; Prednisone; Statins; Fentanyl; Ibuprofen; Tramadol; and Tylenol [acetaminophen]   Social History   Socioeconomic History  . Marital status: Married    Spouse name: Not on file  . Number of children: Not on file  . Years of education: Not on file  . Highest education level: Not on file  Occupational History  . Occupation: Merchant navy officer  Social Needs  . Financial resource strain: Not on file  . Food insecurity:    Worry: Not on file    Inability: Not on file  . Transportation needs:    Medical: Not on file    Non-medical: Not on file  Tobacco Use  . Smoking status: Former Smoker      Packs/day: 1.00    Years: 3.00    Pack years: 3.00    Types: Cigarettes    Last attempt to quit: 09/19/1974    Years since quitting: 44.1  . Smokeless tobacco: Never Used  Substance and Sexual Activity  . Alcohol use: Yes    Comment: occ  . Drug use: No  . Sexual activity: Not on file  Lifestyle  . Physical activity:    Days per week: Not on file    Minutes per session: Not on file  . Stress: Not on file  Relationships  . Social connections:    Talks on phone: Not on file    Gets together: Not on file    Attends religious service: Not on file    Active member of club or organization: Not on file    Attends meetings of clubs or organizations: Not on file    Relationship status: Not on file  Other Topics Concern  . Not on file  Social History Narrative   Lives in [place] with her husband.  Instructor for early childhood development.      History is notable in that she is married.  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  Family History:  [name] family history includes Cancer in her brother, father, and mother; Congestive Heart Failure in her mother; Heart failure in her mother; Hyperlipidemia in her brother and mother; Lung cancer in her father; Stroke in her mother and another family member.   ROS General: Negative; No fevers, chills, or night sweats; morbid obesity HEENT: Negative; No changes in vision or hearing, sinus congestion, difficulty swallowing Pulmonary: Negative; No cough, wheezing, shortness of breath, hemoptysis Cardiovascular: Atrial fibrillation, per lipidemia GI: Negative; No nausea, vomiting, diarrhea, or abdominal pain GU: Negative; No dysuria, hematuria, or  difficulty voiding Musculoskeletal: Broke her arm in January 2019 Hematologic/Oncology: Negative; no easy bruising, bleeding Endocrine: Positive for diabetes mellitus Neuro: Negative; no changes in balance,  headaches Skin: Negative; No rashes or skin lesions Psychiatric: Negative; No behavioral problems, depression Sleep: HPI  other comprehensive 14 point system review is negative.   PHYSICAL EXAM:   VS:  BP 119/62   Pulse 86   Ht 5' 7"  (1.702 m)   Wt (!) 359 lb (162.8 kg)   SpO2 95%   BMI 56.23 kg/m     Repeat blood pressure was 108/60.  Wt Readings from Last 3 Encounters:  11/01/18 (!) 359 lb (162.8 kg)  09/21/18 (!) 341 lb (154.7 kg)  09/15/18 (!) 353 lb 2.8 oz (160.2 kg)    General: Alert, oriented, no distress.  Skin: normal turgor, no rashes, warm and dry HEENT: Normocephalic, atraumatic. Pupils equal round and reactive to light; sclera anicteric; extraocular muscles intact;  Nose without nasal septal hypertrophy Mouth/Parynx benign; Mallinpatti scale 3 Neck: No JVD, no carotid bruits; normal carotid upstroke Lungs: clear to ausculatation and percussion; no wheezing or rales Chest wall: without tenderness to palpitation Heart: PMI not displaced, RRR, s1 s2 normal, 1/6 systolic murmur, no diastolic murmur, no rubs, gallops, thrills, or heaves Abdomen: Significant central adiposity ; soft, nontender; no hepatosplenomehaly, BS+; abdominal aorta nontender and not dilated by palpation. Back: no CVA tenderness Pulses 2+ Musculoskeletal: full range of motion, normal strength, no joint deformities Extremities: no clubbing cyanosis or edema, Homan's sign negative  Neurologic: grossly nonfocal; Cranial nerves grossly wnl Psychologic: Normal mood and affect   Studies/Labs Reviewed:   EKG:  EKG is ordered today.  ECG (independently read by me): Atril fibrillation 86 bpm.  Low voltage.  No ectopy  August 06, 2018 ECG (independently read by me): Atrial fibrillation 85 bpm.  Mild RV conduction delay.  No ectopy.  Normal intervals.  Recent Labs: BMP Latest Ref Rng & Units 09/15/2018 09/08/2018 09/07/2018  Glucose 70 - 99 mg/dL 114(H) 108(H) 126(H)  BUN 8 - 23 mg/dL 13 16 16     Creatinine 0.44 - 1.00 mg/dL 0.62 0.71 0.71  BUN/Creat Ratio 12 - 28 - - -  Sodium 135 - 145 mmol/L 142 135 137  Potassium 3.5 - 5.1 mmol/L 4.4 3.8 4.0  Chloride 98 - 111 mmol/L 108 105 106  CO2 22 - 32 mmol/L 25 20(L) 18(L)  Calcium 8.9 - 10.3 mg/dL 9.2 8.5(L) 8.7(L)     Hepatic Function Latest Ref Rng & Units 09/15/2018 09/08/2018 09/08/2018  Total Protein 6.5 - 8.1 g/dL 7.3 - 6.2(L)  Albumin 3.5 - 5.0 g/dL 3.7 - 3.3(L)  AST 15 - 41 U/L 42(H) - 41  ALT 0 - 44 U/L 24 - 25  Alk Phosphatase 38 - 126 U/L 77 - 67  Total Bilirubin 0.3 - 1.2 mg/dL 2.7(H) 5.4(H) 5.4(H)  Bilirubin, Direct 0.0 - 0.2 mg/dL - 0.9(H) -    CBC Latest Ref Rng & Units 09/16/2018 09/08/2018 09/07/2018  WBC 4.0 - 10.5 K/uL 4.6 3.2(L) 4.4  Hemoglobin 12.0 - 15.0 g/dL 12.4 12.3 12.4  Hematocrit 36.0 - 46.0 % 39.1 38.3 37.7  Platelets 150 - 400 K/uL 48(L) 38(L) 39(L)   Lab Results  Component Value Date   MCV 94.0 09/16/2018   MCV 93.4 09/08/2018   MCV 93.3 09/07/2018   Lab Results  Component Value Date   TSH 2.932 01/27/2016   Lab Results  Component Value Date   HGBA1C 5.6 08/28/2017  BNP    Component Value Date/Time   BNP 153.6 (H) 09/16/2018 0030    ProBNP    Component Value Date/Time   PROBNP 253 06/08/2018 0955     Lipid Panel     Component Value Date/Time   CHOL 175 07/02/2014 1138   TRIG 93 07/02/2014 1138   HDL 59 07/02/2014 1138   CHOLHDL 3.0 07/02/2014 1138   VLDL 19 07/02/2014 1138   LDLCALC 97 07/02/2014 1138     RADIOLOGY: No results found.   Additional studies/ records that were reviewed today include:  I reviewed the patient's original sleep study from [facility] dated Jan 30, 2002.  I reviewed subsequent  evaluation by Dr. Annamaria Boots in 2008 CPAP titration up to 14 cm water pressure.  Office records from Dr. Oval Linsey were reviewed.  I was able to obtain a download October 16 through August 02, 2018 from [facility].  ASSESSMENT:    1. OSA on CPAP   2.  Essential hypertension   3. Persistent atrial fibrillation   4. Morbid obesity ([place])   5. Acute on chronic diastolic CHF (congestive heart failure), NYHA class 2 ([place])   6. Type 2 diabetes mellitus with other specified complication, without long-term current use of insulin [facility])      PLAN:  Ms. Tashe Purdon is a  63 year old female who has a history of super morbid obesity with a BMI of 55.9, hypertension, permanent atrial fibrillation, diabetes mellitus, hyperlipidemia, as well as cryptogenic cirrhosis and thrombocytopenia.  She has been demonstrated to have severe obstructive sleep apnea since 2003 and has been on CPAP therapy at that time.  Initially her pressures were low but ultimately these were not adequate and she required higher pressures and after CPAP titration study in 2008 a 14 cm water pressure was recommended.  Previously with her old machine she was on 15 cm water pressure.  He has received a new CPAP machine does note significant improvement from her old unit.  She was set initially on an auto pressure with a minimum of 12 at maximum up to 20 and her 95th percentile pressure is 13.7 with a maximum of 14.4.  AHI is excellent at 1.5.  She is compliant with 100% usage.  She received a new ResMed AirFit N 30i nasal mask and since using this new mask, the previous irritation of her nares has resolved.  However, at times she does note that the tubing drops on her head and she does have leak.  Only she has been placing this fairly low on her head that may benefit from placing it at the crown of her scalp.  She is unaware of breakthrough snoring.  We discussed her only 6 hours and 30 minutes of CPAP use and I recommended optimal sleep duration at 8 hours if at all possible.  She continues to be significantly obese with a BMI today at 56.23.  Weight loss and exercise was recommended and it was discussed that this could significantly improve her sleep apnea and potentially reduce her pressure  requirements.  She admits to weight gain seeing Dr. Oval Linsey back for follow-up of her edema no other therapy.  She is doing well from a sleep perspective.  I will be available to see her on an as-needed basis and anytime in the future if problems arise.    Time spent: 25 minutes  Medication Adjustments/Labs and Tests Ordered: Current medicines are reviewed at length with the patient today.  Concerns regarding medicines are  outlined above.  Medication changes, Labs and Tests ordered today are listed in the Patient Instructions below. Patient Instructions  Medication Instructions:  The current medical regimen is effective;  continue present plan and medications.  If you need a refill on your cardiac medications before your next appointment, please call your pharmacy.    Follow-Up: At [facility], you and your health needs are our priority.  As part of our continuing mission to provide you with exceptional heart care, we have created designated Provider Care Teams.  These Care Teams include your primary Cardiologist (physician) and Advanced Practice Providers (APPs -  Physician Assistants and Nurse Practitioners) who all work together to provide you with the care you need, when you need it. . Continue follow up's with Dr.  . Follow up with Dr.Dakia Schifano (sleep) as needed.         Signed, Shelva Majestic, MD  11/01/2018 2:00 PM    [facility] [address] [place], [place], [place]  [address] Phone: [phone]

## 2018-11-16 ENCOUNTER — Ambulatory Visit: Payer: BC Managed Care – PPO | Admitting: Cardiovascular Disease

## 2018-11-16 VITALS — BP 90/54 | Ht 67.0 in | Wt 353.2 lb

## 2018-11-16 DIAGNOSIS — I5032 Chronic diastolic (congestive) heart failure: Secondary | ICD-10-CM | POA: Diagnosis not present

## 2018-11-16 DIAGNOSIS — I4819 Other persistent atrial fibrillation: Secondary | ICD-10-CM

## 2018-11-16 NOTE — Progress Notes (Signed)
Cardiology Office Note   Date:  11/26/2018   ID:  MISHAWN DIDION, DOB 02/25/1956, MRN [id number]  PCP:  Lujean Amel, MD  Cardiologist:   Skeet Latch, MD  Dr. Paulita Fujita: GI Dr. Alen Blew: Hematology  No chief complaint on file.   Patient ID:  Hannah Khan is a 63 y.o. female with hypertension, chronic atrial fibrillation, diabetes mellitus type 2, hyperlipidemia, cryptogenic cirrhosis, thrombocytopenia, and morbid obesity who presents for follow-up.  Hannah Khan was admitted to the hospital 11/2015 with atrial fibrillation with rapid ventricular response. Given her history of portal hypertension and cirrhosis she was not anticoagulated. She was started on diltiazem for rate control and continued on her home propranolol. Her rate did not stay consistently less than 100 bpm so she was started on digoxin as well.  Echo revealed normal systolic function and her thyroid function was normal.  She was admitted to the hospital 11/4740 with diastolic heart failure.  She was diuresed with IV lasix and digoxin was discontinued due to hypokalemia.  She was seen in follow up 02/2016, at which time she complained of hypotension.  Her heart rate was well-controlled but she continued to feel poorly in AF.  She was referred to Dr. Rayann Heman for consideration of a Watchman device however, he felt that she would not be a good candidate, as she will only be able to be on antiplatelets for one month and she is unlikely to remain in sinus rhythm without significant weight loss.   Hannah Khan fell and broke her R arm 09/2017.  She was also hospitalized July and August 2019 with cellulitis.  During that hospitalization she had atrial fibrillation with rapid ventricular response.  She was initially treated with a diltiazem drip and then transitioned back to oral diltiazem.  Since hospitalization she has struggled with lower extremity edema. She notes a variable response to her Bumex.  Bumex was increased to 3 mg twice  daily.  However she thought she was supposed be taking it 2 mg 3 times a day and was unable to do so because of schedule issues.  She continued to take it 2 mg twice daily.  She remains in atrial fibrillation and her rate was poorly controlled so digoxin was added to her regimen.  Her heart rate has been mostly in the 80s to 90s.  However, she had a headache on digoxin so it was discontinued.  She got a new CPAP machine and has been using it regularly.    Hannah Khan was admitted 08/2018 with atrial fibrillation with RVR.  Her heart rate was 160 at home and improved to 135 with her medication.  She was given adenosine and then started on a diltiazem drip.  BNP was 266 and troponin was negative.  It was thought that the episode was triggered by dehydration from vomiting after colonoscopy and possibly viral gatroenteritis.  She had constant diarrhea after the procedure.  She was transitioned back to oral diltiazem.  She was then seen in the ED one week later after a fall.  She was coming out of the bathroom and got her feet tangled.  Since her last appointment Hannah Khan ha struggled with an URI that required antibiotics and an inhaler.  Her breathing has improved.  For the last two days she has not ben taking bumetanide because of work and travel.  She has edema but denies orthopnea or PND.  She plans to use Spring Break to catch up on her diuretic.  She denies palpitations or chest pain.      Past Medical History:  Diagnosis Date  . Anemia    hx of  . Arthritis    knees  . Diabetes mellitus without complication ([place])    type 2  . Fibromyalgia   . HTN (hypertension)   . Hx of colonic polyps    s/p partial colectomy  . Hyperlipidemia   . Joint pain    in knees and back spasms  . Left leg swelling    wear compression hose  . Liver cirrhosis secondary to NASH (nonalcoholic steatohepatitis) ([place]) dx nov 2014   had enlarged spleen also   . Low blood pressure    bp varies  . Morbid obesity ([place])    . Persistent atrial fibrillation   . Pneumonia 10/2016  . PONV (postoperative nausea and vomiting)    in past none recent  . Portal hypertension ([place])   . Sleep apnea    uses cpap setting of 14  . Spleen enlarged   . 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 [facility])     Past Surgical History:  Procedure Laterality Date  . BREAST LUMPECTOMY WITH RADIOACTIVE SEED LOCALIZATION Right 08/29/2017   Procedure: RIGHT BREAST LUMPECTOMY WITH RADIOACTIVE SEEDS LOCALIZATION;  Surgeon: Erroll Luna, MD;  Location: [facility];  Service: General;  Laterality: Right;  . CESAREAN SECTION  1989  . CHOLECYSTECTOMY  20 yrs ago  . COLECTOMY  2011  . COLONOSCOPY    . COLONOSCOPY N/A 09/05/2018   Procedure: COLONOSCOPY;  Surgeon: [name], MD;  Location: [facility];  Service: Endoscopy;  Laterality: N/A;  . DILATATION & CURETTAGE/HYSTEROSCOPY WITH MYOSURE N/A 05/06/2016   Procedure: DILATATION & CURETTAGE/HYSTEROSCOPY WITH MYOSURE WITH POLYPECTOMY;  Surgeon: Janyth Pupa, DO;  Location: [facility];  Service: Gynecology;  Laterality: N/A;  . ESOPHAGOGASTRODUODENOSCOPY (EGD) WITH PROPOFOL N/A 09/04/2013   Procedure: ESOPHAGOGASTRODUODENOSCOPY (EGD) WITH PROPOFOL;  Surgeon: [name], MD;  Location: [facility];  Service: Endoscopy;  Laterality: N/A;  . KNEE ARTHROSCOPY  yrs ago   bilateral, one done x 1, one done twice  . POLYPECTOMY  09/05/2018   Procedure: POLYPECTOMY;  Surgeon: [name], MD;  Location: [facility];  Service: Endoscopy;;     Current Outpatient Medications  Medication Sig Dispense Refill  . albuterol (PROVENTIL HFA;VENTOLIN HFA) 108 (90 Base) MCG/ACT inhaler TAKE 2 PUFFS INTO LUNGS EVERY 6 HOURS AS NEEDED    . bumetanide (BUMEX) 2 MG tablet Take 2-3 mg by mouth 2 (two) times daily as needed (fluid).     . calcium carbonate (OS-CAL - DOSED IN MG OF ELEMENTAL CALCIUM) 1250 (500 Ca) MG tablet Take 1 tablet by mouth daily with breakfast.     . diltiazem (CARDIZEM CD) 300 MG 24  hr capsule TAKE 1 CAPSULE BY MOUTH EVERY DAY (Patient taking differently: Take 300 mg by mouth daily. TAKE 1 CAPSULE BY MOUTH EVERY DAY) 90 capsule 3  . JARDIANCE 25 MG TABS tablet Take 25 mg by mouth daily after breakfast.   5  . KLOR-CON M20 20 MEQ tablet Take 1 tablet (20 mEq total) by mouth daily. 90 tablet 3  . propranolol (INDERAL) 10 MG tablet Take 1 tablet (10 mg total) by mouth 2 (two) times daily. 180 tablet 1   No current facility-administered medications for this visit.     Allergies:   Celecoxib; Shellfish allergy; Barium-containing compounds; Prednisone; Statins; Fentanyl; Ibuprofen; Tramadol; and Tylenol [acetaminophen]    Social History:  The patient  reports that she  quit smoking about 44 years ago. Her smoking use included cigarettes. She has a 3.00 pack-year smoking history. She has never used smokeless tobacco. She reports current alcohol use. She reports that she does not use drugs.   Family History:  [name] family history includes Cancer in her brother, father, and mother; Congestive Heart Failure in her mother; Heart failure in her mother; Hyperlipidemia in her brother and mother; Lung cancer in her father; Stroke in her mother and another family member.    ROS:  Please see the history of present illness.   Otherwise, review of systems are positive for none.   All other systems are reviewed and negative.    PHYSICAL EXAM: VS:  BP (!) 90/54   Ht 5' 7"  (1.702 m)   Wt (!) 353 lb 3.2 oz (160.2 kg)   SpO2 98%   BMI 55.32 kg/m  , BMI Body mass index is 55.32 kg/m. GENERAL:  Well-appearing.  HEENT: Pupils equal round and reactive, fundi not visualized, oral mucosa unremarkable NECK:  No jugular venous distention, waveform within normal limits, carotid upstroke brisk and symmetric, no bruits, no thyromegaly LUNGS:  Clear to auscultation bilaterally HEART:  Irregularly irregular.  PMI not displaced or sustained,S1 and S2 within normal limits, no S3, no S4, no  clicks, no rubs, no murmurs ABD:  Flat, positive bowel sounds normal in frequency in pitch, no bruits, no rebound, no guarding, no midline pulsatile mass, no hepatomegaly, no splenomegaly EXT:  2 plus pulses throughout, 2+ LE edema, no cyanosis no clubbing SKIN:  No rashes no nodules NEURO:  Cranial nerves II through XII grossly intact, motor grossly intact throughout PSYCH:  Cognitively intact, oriented to person place and time   EKG:  EKG is ordered today.  06/06/16: Atrial fibrillation. Rate 93 bpm. Low voltage. 11/14/16: Atrial fibrillation. Rate 87 bpm. 02/07/79 atrial fibrillation. Rate 95 bpm. Low voltage precordial leads. Incomplete right bundle branch block. 06/06/17: Atrial fibrillation.  Rate 76 bpm.   06/22/2018: Atrial fibrillation.  Rate 84 bpm.  Incomplete right bundle branch block.  Low voltage.  LAFB. 11/16/18: Atrial fibrillation.  Rate 73 bpm.  Low voltage.  LAFB  Incomplete RBBB  Echo 12/10/15: Study Conclusions  - Procedure narrative: Transthoracic echocardiography. The study  was technically difficult, as a result of body habitus.  Intravenous contrast (Definity) was administered. - Left ventricle: The cavity size was normal. Wall thickness was  increased in a pattern of mild LVH. Systolic function was normal.  The estimated ejection fraction was in the range of 55% to 60%.  Wall motion was normal; there were no regional wall motion  abnormalities. The study is not technically sufficient to allow  evaluation of LV diastolic function. - Mitral valve: Mildly thickened leaflets . There was trivial  regurgitation. - Left atrium: The atrium was mildly dilated. - Inferior vena cava: The vessel was dilated. The respirophasic  diameter changes were blunted (< 50%), consistent with elevated  central venous pressure. - Pericardium, extracardiac: A trivial pericardial effusion was  identified posterior to the heart.  Impressions:  - Technically difficult study.  The patient was in atrial flutter  during the study. The LVEF is 55-60%, mild LVH, grossly normal  wall motion, trivial MR, mild LAE, dilated IVC, trivial posterior  pericardial effusion.  Recent Labs: 04/26/2018: Magnesium 1.9 06/08/2018: NT-Pro BNP 253 09/15/2018: ALT 24; BUN 13; Creatinine, Ser 0.62; Potassium 4.4; Sodium 142 09/16/2018: B Natriuretic Peptide 153.6; Hemoglobin 12.4; Platelets 48    Lipid Panel  Component Value Date/Time   CHOL 175 07/02/2014 1138   TRIG 93 07/02/2014 1138   HDL 59 07/02/2014 1138   CHOLHDL 3.0 07/02/2014 1138   VLDL 19 07/02/2014 1138   LDLCALC 97 07/02/2014 1138      Wt Readings from Last 3 Encounters:  11/16/18 (!) 353 lb 3.2 oz (160.2 kg)  11/01/18 (!) 359 lb (162.8 kg)  09/21/18 (!) 341 lb (154.7 kg)     ASSESSMENT AND PLAN:  # Persistent atrial fibrillation: Rate has been controlled on diltiazem and propranolol.  She was admitted with afib with RVR in the setting of intravascular volume depletion.  HR better controlled on digoxin but it was stopped 2/2 headaches.  She is not on anticoagulation due to high risk of GI bleed and cirrhosis.  This patients CHA2DS2-VASc Score and unadjusted Ischemic Stroke Rate (% per year) is equal to 3.2 % stroke rate/year from a score of 3 Above score calculated as 1 point each if present [CHF, HTN, DM, Vascular=MI/PAD/Aortic Plaque, Age if 65-74, or Female] Above score calculated as 2 points each if present [Age > 75, or Stroke/TIA/TE]  # Chronic diastolic heart failure: She has increased LE edema in the setting of not taking her diuretics. She will take it more regularly while she is on Spring Break.   # OSA: Continue CPAP.     Current medicines are reviewed at length with the patient today.  The patient does not have concerns regarding medicines.   The following changes have been made:  none  Labs/ tests ordered today include:   No orders of the defined types were placed in this encounter.       Disposition:   FU with Cari Burgo C. Oval Linsey, MD, [facility] in 3 months.    Signed, Yarissa Reining C. Oval Linsey, MD, [facility]  11/26/2018 10:52 PM    [place]

## 2018-11-16 NOTE — Patient Instructions (Signed)
Medication Instructions:  Your physician recommends that you continue on your current medications as directed. Please refer to the Current Medication list given to you today.  If you need a refill on your cardiac medications before your next appointment, please call your pharmacy.   Lab work: NONE  Testing/Procedures: NONE  Follow-Up: At [facility], you and your health needs are our priority.  As part of our continuing mission to provide you with exceptional heart care, we have created designated Provider Care Teams.  These Care Teams include your primary Cardiologist (physician) and Advanced Practice Providers (APPs -  Physician Assistants and Nurse Practitioners) who all work together to provide you with the care you need, when you need it. You will need a follow up appointment in 6 months.  Please call our office 2 months in advance to schedule this appointment.  You may see Skeet Latch, MD or one of the following Advanced Practice Providers on your designated Care Team:   Kerin Ransom, PA-C Roby Lofts, [place] . Sande Rives, PA-C  Your physician recommends that you schedule a follow-up appointment in: 3 MONTHS WITH LUKE K PA

## 2018-11-26 ENCOUNTER — Encounter: Payer: Self-pay | Admitting: Cardiovascular Disease

## 2019-01-15 ENCOUNTER — Encounter: Payer: Self-pay | Admitting: *Deleted

## 2019-01-15 ENCOUNTER — Telehealth: Payer: Self-pay | Admitting: Cardiovascular Disease

## 2019-01-15 DIAGNOSIS — I5032 Chronic diastolic (congestive) heart failure: Secondary | ICD-10-CM

## 2019-01-15 NOTE — Telephone Encounter (Signed)
Left message to call back  

## 2019-01-15 NOTE — Telephone Encounter (Signed)
Spoke with patient and scheduled visit Friday with Doreene Burke. PA  She will come Thursday for labs  Verbal consent obtained and information sent via mychart   Patient aware best possible care given via video and insurance will be billed

## 2019-01-15 NOTE — Telephone Encounter (Signed)
Spoke to patient . She states she  Has gain weight over the last  4-5 days up to 16 lbs. patientr states she takes bumex 2 mg daily . But  On thrusday or Friday she took to 2 tablets total of 4 mg  That day . She states she still had fluid retention - she states she 's unable to put on  Her compression  hose. 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 She states it usually range in the 90's/50-60.  RN  Informed patient to take 2 bumex tablets for the next 2- 3 days.  Will defer to Dr Oval Linsey  And extender tfor further instructions

## 2019-01-15 NOTE — Telephone Encounter (Signed)
New Message  Patient returning your phone call, please call her back.

## 2019-01-15 NOTE — Telephone Encounter (Signed)
Pt c/o swelling: STAT is pt has developed SOB within 24 hours  1) How much weight have you gained and in what time span? 16 pounds in last 4-5 days  2) If swelling, where is the swelling located? Legs, ankles, feet  3) Are you currently taking a fluid pill? Yes, but it takes fluid more from her chest  4) Are you currently SOB? Little bit but not that bad  5) Do you have a log of your daily weights (if so, list)? no  6) Have you gained 3 pounds in a day or 5 pounds in a week? yes  7) Have you traveled recently? No  Patient is wondering if she needs lasix like she has used in the past.

## 2019-01-15 NOTE — Telephone Encounter (Signed)
Message received from Triage. I returned call to patient. She has experienced increased fluid which she attributes to sitting more than normal with her job. She took 4 mg bumex on Friday and 4 mg bumex today with little relief. She took her regular 2 mg dose on all other days. She has not taken extra potassium supplementation.   I advised her to take 4 mg tomorrow and Wed. I asked her to come in for lab work Thursday morning. I would like for her to have a telehealth visit with Dr. Oval Linsey or an APP on Friday; sooner if she is not improving.   I will route back to Triage to set up telehealth visit and to obtain consent. I have sent instructions to her MyChart account.

## 2019-01-16 ENCOUNTER — Telehealth: Payer: Self-pay | Admitting: Cardiology

## 2019-01-17 ENCOUNTER — Other Ambulatory Visit: Payer: Self-pay | Admitting: Physician Assistant

## 2019-01-17 ENCOUNTER — Other Ambulatory Visit: Payer: Self-pay | Admitting: *Deleted

## 2019-01-17 DIAGNOSIS — I5032 Chronic diastolic (congestive) heart failure: Secondary | ICD-10-CM

## 2019-01-17 LAB — BASIC METABOLIC PANEL
BUN/Creatinine Ratio: 19 (ref 12–28)
BUN: 16 mg/dL (ref 8–27)
CO2: 24 mmol/L (ref 20–29)
Calcium: 8.9 mg/dL (ref 8.7–10.3)
Chloride: 103 mmol/L (ref 96–106)
Creatinine, Ser: 0.84 mg/dL (ref 0.57–1.00)
GFR calc Af Amer: 86 mL/min/{1.73_m2} (ref >59)
GFR calc non Af Amer: 75 mL/min/{1.73_m2} (ref >59)
Glucose: 134 mg/dL — ABNORMAL HIGH (ref 65–99)
Potassium: 3.8 mmol/L (ref 3.5–5.2)
Sodium: 142 mmol/L (ref 134–144)

## 2019-01-18 ENCOUNTER — Telehealth (INDEPENDENT_AMBULATORY_CARE_PROVIDER_SITE_OTHER): Payer: BC Managed Care – PPO | Admitting: Cardiology

## 2019-01-18 ENCOUNTER — Encounter: Payer: Self-pay | Admitting: Cardiology

## 2019-01-18 ENCOUNTER — Telehealth: Payer: Self-pay

## 2019-01-18 VITALS — BP 107/68 | HR 100 | Ht 67.0 in | Wt 348.0 lb

## 2019-01-18 DIAGNOSIS — I10 Essential (primary) hypertension: Secondary | ICD-10-CM

## 2019-01-18 DIAGNOSIS — K7469 Other cirrhosis of liver: Secondary | ICD-10-CM

## 2019-01-18 DIAGNOSIS — I4891 Unspecified atrial fibrillation: Secondary | ICD-10-CM

## 2019-01-18 DIAGNOSIS — E119 Type 2 diabetes mellitus without complications: Secondary | ICD-10-CM

## 2019-01-18 DIAGNOSIS — G4733 Obstructive sleep apnea (adult) (pediatric): Secondary | ICD-10-CM

## 2019-01-18 DIAGNOSIS — D696 Thrombocytopenia, unspecified: Secondary | ICD-10-CM

## 2019-01-18 NOTE — Telephone Encounter (Signed)
Contacted patient to discuss AVS instructions. Patient notified and voiced understanding.

## 2019-01-18 NOTE — Progress Notes (Signed)
Virtual Visit via Video Note   This visit type was conducted due to national recommendations for restrictions regarding the COVID-19 Pandemic (e.g. social distancing) in an effort to limit this patient's exposure and mitigate transmission in our community.  Due to her co-morbid illnesses, this patient is at least at moderate risk for complications without adequate follow up.  This format is felt to be most appropriate for this patient at this time.  All issues noted in this document were discussed and addressed.  A limited physical exam was performed with this format.  Please refer to the patient's chart for her consent to telehealth for [facility]   Date:  01/18/2019   ID:  Hannah Khan, DOB 10-24-1955, MRN [id number]  Patient Location: Home Provider Location: Home  PCP:  Lujean Amel, MD  Cardiologist:  Skeet Latch, MD  Electrophysiologist:  None   Evaluation Performed:  Follow-Up Visit  Chief Complaint:  edema  History of Present Illness:    Hannah Khan is a 63 y.o. female with history of atrial fibrillation, initially diagnosed in March 2017.  Other medical problems include hypertension, with a tendency to hypotension, non-insulin-dependent diabetes, cryptogenic cirrhosis with portal hypertension, thrombocytopenia, morbid obesity with a BMI of 55, and sleep apnea on CPAP.  She is not anticoagulated because of her thrombocytopenia and portal hypertension.  Echocardiogram in the past that showed normal LV function.  She has been seen by Dr. Rayann Heman for consideration of a watchman device but he did not feel she was a candidate secondary to her thrombocytopenia.  The patient has been in atrial fibrillation on the last several office visits, we assume this is now chronic atrial fibrillation.  She has had problems with rapid rates when she was hospitalized for various issues over the past year.  Diltiazem works best for her although at times she has had some problems with  hypotension.  She has issues with chronic lower extremity edema, I suspect this is a combination of diastolic heart failure and diltiazem.  She takes Bumex 2 mg daily.  Recently she has had an increase in her edema and her weight.  A few weeks ago her weight had drifted up to 360 pounds and her edema had markedly worsened.  She was advised to increase her Bumex to 4 mg a day.  Her weight is gradually come down on this dose to 348 pounds today and her edema is improved.  She did have labs done on April 30 that showed a BUN of 16 and a creatinine of 0.8.  On her video visit today she appeared in no acute distress and in good spirits.  I was actually able to review her feet she has still has 1-2+ lower extremity edema.  I suggested the patient continue at Bumex 4 mg a day until her weight gets down to 340 pounds.  At that point she can cut it back to 2 mg daily.  If she drifts back up closer to 345 pounds she could try taking Bumex 4 mg on Monday Wednesday and Friday and 2 mg other days.  She has a previously scheduled visit in May with me, I asked her to keep this so we could see how she is doing.  If she is stable at that point on that dose we can have her follow-up with Dr. Oval Linsey in the Fall.  The patient does not have symptoms concerning for COVID-19 infection (fever, chills, cough, or new shortness of breath).  Past Medical History:  Diagnosis Date  . Anemia    hx of  . Arthritis    knees  . Diabetes mellitus without complication ([place])    type 2  . Fibromyalgia   . HTN (hypertension)   . Hx of colonic polyps    s/p partial colectomy  . Hyperlipidemia   . Joint pain    in knees and back spasms  . Left leg swelling    wear compression hose  . Liver cirrhosis secondary to NASH (nonalcoholic steatohepatitis) ([place]) dx nov 2014   had enlarged spleen also   . Low blood pressure    bp varies  . Morbid obesity ([place])   . Persistent atrial fibrillation   . Pneumonia 10/2016  . PONV  (postoperative nausea and vomiting)    in past none recent  . Portal hypertension ([employer])   . Sleep apnea    uses cpap setting of 14  . Spleen enlarged   . 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 [facility])    Past Surgical History:  Procedure Laterality Date  . BREAST LUMPECTOMY WITH RADIOACTIVE SEED LOCALIZATION Right 08/29/2017   Procedure: RIGHT BREAST LUMPECTOMY WITH RADIOACTIVE SEEDS LOCALIZATION;  Surgeon: Erroll Luna, MD;  Location: [facility];  Service: General;  Laterality: Right;  . CESAREAN SECTION  1989  . CHOLECYSTECTOMY  20 yrs ago  . COLECTOMY  2011  . COLONOSCOPY    . COLONOSCOPY N/A 09/05/2018   Procedure: COLONOSCOPY;  Surgeon: [name], MD;  Location: [facility];  Service: Endoscopy;  Laterality: N/A;  . DILATATION & CURETTAGE/HYSTEROSCOPY WITH MYOSURE N/A 05/06/2016   Procedure: DILATATION & CURETTAGE/HYSTEROSCOPY WITH MYOSURE WITH POLYPECTOMY;  Surgeon: Janyth Pupa, DO;  Location: [facility];  Service: Gynecology;  Laterality: N/A;  . ESOPHAGOGASTRODUODENOSCOPY (EGD) WITH PROPOFOL N/A 09/04/2013   Procedure: ESOPHAGOGASTRODUODENOSCOPY (EGD) WITH PROPOFOL;  Surgeon: [name], MD;  Location: [facility];  Service: Endoscopy;  Laterality: N/A;  . KNEE ARTHROSCOPY  yrs ago   bilateral, one done x 1, one done twice  . POLYPECTOMY  09/05/2018   Procedure: POLYPECTOMY;  Surgeon: [name], MD;  Location: [facility];  Service: Endoscopy;;     Current Meds  Medication Sig  . albuterol (PROVENTIL HFA;VENTOLIN HFA) 108 (90 Base) MCG/ACT inhaler TAKE 2 PUFFS INTO LUNGS EVERY 6 HOURS AS NEEDED  . bumetanide (BUMEX) 2 MG tablet Take 2-3 mg by mouth 2 (two) times daily as needed (fluid).   . calcium carbonate (OS-CAL - DOSED IN MG OF ELEMENTAL CALCIUM) 1250 (500 Ca) MG tablet Take 1 tablet by mouth daily with breakfast.   . diltiazem (CARDIZEM CD) 300 MG 24 hr capsule TAKE 1 CAPSULE BY MOUTH EVERY DAY (Patient taking differently: Take 300 mg by mouth daily. TAKE 1  CAPSULE BY MOUTH EVERY DAY)  . JARDIANCE 25 MG TABS tablet Take 25 mg by mouth daily after breakfast.   . KLOR-CON M20 20 MEQ tablet Take 1 tablet (20 mEq total) by mouth daily.  . propranolol (INDERAL) 10 MG tablet Take 1 tablet (10 mg total) by mouth 2 (two) times daily.  Marland Kitchen spironolactone (ALDACTONE) 25 MG tablet Take 25 mg by mouth daily.     Allergies:   Celecoxib; Shellfish allergy; Barium-containing compounds; Prednisone; Statins; Fentanyl; Ibuprofen; Tramadol; and Tylenol [acetaminophen]   Social History   Tobacco Use  . Smoking status: Former Smoker    Packs/day: 1.00    Years: 3.00    Pack years: 3.00    Types: Cigarettes    Last attempt  to quit: 09/19/1974    Years since quitting: 44.3  . Smokeless tobacco: Never Used  Substance Use Topics  . Alcohol use: Yes    Comment: occ  . Drug use: No     Family Hx: [name] family history includes Cancer in her brother, father, and mother; Congestive Heart Failure in her mother; Heart failure in her mother; Hyperlipidemia in her brother and mother; Lung cancer in her father; Stroke in her mother and another family member.  ROS:   Please see the history of present illness.    All other systems reviewed and are negative.   Prior CV studies:   The following studies were reviewed today: Echo 2017  Labs/Other Tests and Data Reviewed:     Recent Labs: 04/26/2018: Magnesium 1.9 06/08/2018: NT-Pro BNP 253 09/15/2018: ALT 24 09/16/2018: B Natriuretic Peptide 153.6; Hemoglobin 12.4; Platelets 48 01/17/2019: BUN 16; Creatinine, Ser 0.84; Potassium 3.8; Sodium 142   Recent Lipid Panel Lab Results  Component Value Date/Time   CHOL 175 07/02/2014 11:38 AM   TRIG 93 07/02/2014 11:38 AM   HDL 59 07/02/2014 11:38 AM   CHOLHDL 3.0 07/02/2014 11:38 AM   LDLCALC 97 07/02/2014 11:38 AM    Wt Readings from Last 3 Encounters:  01/18/19 (!) 348 lb (157.9 kg)  11/16/18 (!) 353 lb 3.2 oz (160.2 kg)  11/01/18 (!) 359 lb (162.8 kg)      Objective:    Vital Signs:  BP 107/68   Pulse 100   Ht 5' 7"  (1.702 m)   Wt (!) 348 lb (157.9 kg)   BMI 54.50 kg/m    VITAL SIGNS:  reviewed  ASSESSMENT & PLAN:    CAF- Fair rate control. Diltiazem seems to work best for her, continue current dose  LE edema- Multifactorial- obesity, sedentary, Diltiazem, and possible diastolic dysfunction secondary to AF  Morbid obesity- BMI-55  OSA- On C-pap  Cryptogenic cirrhosis with portal hypertension and thrombocytopenia- Not a candidate for anticoagulation     COVID-19 Education: The signs and symptoms of COVID-19 were discussed with the patient and how to seek care for testing (follow up with PCP or arrange E-visit).  The importance of social distancing was discussed today.  Time:   Today, I have spent 20 minutes with the patient with telehealth technology discussing the above problems.     Medication Adjustments/Labs and Tests Ordered: Current medicines are reviewed at length with the patient today.  Concerns regarding medicines are outlined above.   Tests Ordered: No orders of the defined types were placed in this encounter.   Medication Changes: No orders of the defined types were placed in this encounter.   Disposition:  Follow up Keep virtual visit in May as scheduled  Signed, Kerin Ransom, Hershal Coria  01/18/2019 11:09 AM    [place]

## 2019-01-18 NOTE — Patient Instructions (Addendum)
Medication Instructions:  INCREASE Bumex to 47m once a day TAKE THIS UNTIL YOUR WEIGHT COMES DOWN TO 340 THEN DECREASE DOSAGE TO 2 mg ONCE A DAY AND IF YOUR WEIGHT GOES BACK UP TO 345 THEN INCREASE BUMEX TO 4MG ON ONCE A DAY ON MONDAYS, WEDNESDAYS AND FRIDAYS AND 268mON TUESDAYS, THURSDAYS, [place] you need a refill on your cardiac medications before your next appointment, please call your pharmacy.   Lab work: NONE  If you have labs (blood work) drawn today and your tests are completely normal, you will receive your results only by: . Marland KitchenyChart Message (if you have MyChart) OR . A paper copy in the mail If you have any lab test that is abnormal or we need to change your treatment, we will call you to review the results.  Testing/Procedures: NONE   Follow-Up: [facility].  As part of our continuing mission to provide you with exceptional heart care, we have created designated Provider Care Teams.  These Care Teams include your primary Cardiologist (physician) and Advanced Practice Providers (APPs -  Physician Assistants and Nurse Practitioners) who all work together to provide you with the care you need, when you need it. . Marland KitchenUKE RECOMMENDS YOU FOLLOW UP AS SCHEDULED  Any Other Special Instructions Will Be Listed Below (If Applicable).

## 2019-02-01 ENCOUNTER — Other Ambulatory Visit: Payer: Self-pay

## 2019-02-01 ENCOUNTER — Ambulatory Visit: Payer: Self-pay | Admitting: Oncology

## 2019-02-12 ENCOUNTER — Telehealth: Payer: Self-pay | Admitting: Cardiology

## 2019-02-12 NOTE — Telephone Encounter (Signed)
Mychart, smartphone, consent, pre reg complete 02/12/19 AF

## 2019-02-14 ENCOUNTER — Telehealth: Payer: Self-pay | Admitting: Cardiology

## 2019-02-14 ENCOUNTER — Encounter: Payer: Self-pay | Admitting: Cardiology

## 2019-02-14 ENCOUNTER — Telehealth (INDEPENDENT_AMBULATORY_CARE_PROVIDER_SITE_OTHER): Payer: BC Managed Care – PPO | Admitting: Cardiology

## 2019-02-14 ENCOUNTER — Telehealth: Payer: Self-pay

## 2019-02-14 VITALS — BP 95/60 | HR 94 | Ht 67.0 in | Wt 344.0 lb

## 2019-02-14 DIAGNOSIS — K7581 Nonalcoholic steatohepatitis (NASH): Secondary | ICD-10-CM

## 2019-02-14 DIAGNOSIS — I5032 Chronic diastolic (congestive) heart failure: Secondary | ICD-10-CM | POA: Diagnosis not present

## 2019-02-14 DIAGNOSIS — E119 Type 2 diabetes mellitus without complications: Secondary | ICD-10-CM

## 2019-02-14 DIAGNOSIS — K7460 Unspecified cirrhosis of liver: Secondary | ICD-10-CM

## 2019-02-14 DIAGNOSIS — I4821 Permanent atrial fibrillation: Secondary | ICD-10-CM

## 2019-02-14 DIAGNOSIS — G4733 Obstructive sleep apnea (adult) (pediatric): Secondary | ICD-10-CM

## 2019-02-14 NOTE — Patient Instructions (Addendum)
Medication Instructions:  Your physician recommends that you continue on your current medications as directed. Please refer to the Current Medication list given to you today. If you need a refill on your cardiac medications before your next appointment, please call your pharmacy.   Lab work: None  If you have labs (blood work) drawn today and your tests are completely normal, you will receive your results only by: Marland Kitchen MyChart Message (if you have MyChart) OR . A paper copy in the mail If you have any lab test that is abnormal or we need to change your treatment, we will call you to review the results.  Testing/Procedures: None   Follow-Up: At [facility], you and your health needs are our priority.  As part of our continuing mission to provide you with exceptional heart care, we have created designated Provider Care Teams.  These Care Teams include your primary Cardiologist (physician) and Advanced Practice Providers (APPs -  Physician Assistants and Nurse Practitioners) who all work together to provide you with the care you need, when you need it. You will need a follow up appointment in 4-5 months.  Please call our office 2 months in advance to schedule this appointment.  You may see Skeet Latch, MD or one of the following Advanced Practice Providers on your designated Care Team:   Kerin Ransom, PA-C Roby Lofts, [place] . Sande Rives, PA-C  Any Other Special Instructions Will Be Listed Below (If Applicable).      Low-Sodium Eating Plan Sodium, which is an element that makes up salt, helps you maintain a healthy balance of fluids in your body. Too much sodium can increase your blood pressure and cause fluid and waste to be held in your body. Your health care provider or dietitian may recommend following this plan if you have high blood pressure (hypertension), kidney disease, liver disease, or heart failure. Eating less sodium can help lower your blood pressure, reduce  swelling, and protect your heart, liver, and kidneys. What are tips for following this plan? General guidelines  Most people on this plan should limit their sodium intake to 1,500-2,000 mg (milligrams) of sodium each day. Reading food labels   The Nutrition Facts label lists the amount of sodium in one serving of the food. If you eat more than one serving, you must multiply the listed amount of sodium by the number of servings.  Choose foods with less than 140 mg of sodium per serving.  Avoid foods with 300 mg of sodium or more per serving. Shopping  Look for lower-sodium products, often labeled as "low-sodium" or "no salt added."  Always check the sodium content even if foods are labeled as "unsalted" or "no salt added".  Buy fresh foods. ? Avoid canned foods and premade or frozen meals. ? Avoid canned, cured, or processed meats  Buy breads that have less than 80 mg of sodium per slice. Cooking  Eat more home-cooked food and less restaurant, buffet, and fast food.  Avoid adding salt when cooking. Use salt-free seasonings or herbs instead of table salt or sea salt. Check with your health care provider or pharmacist before using salt substitutes.  Cook with plant-based oils, such as canola, sunflower, or olive oil. Meal planning  When eating at a restaurant, ask that your food be prepared with less salt or no salt, if possible.  Avoid foods that contain MSG (monosodium glutamate). MSG is sometimes added to [place] food, bouillon, and some canned foods. What foods are recommended? The items  listed may not be a complete list. Talk with your dietitian about what dietary choices are best for you. Grains Low-sodium cereals, including oats, puffed wheat and rice, and shredded wheat. Low-sodium crackers. Unsalted rice. Unsalted pasta. Low-sodium bread. Whole-grain breads and whole-grain pasta. Vegetables Fresh or frozen vegetables. "No salt added" canned vegetables. "No salt added"  tomato sauce and paste. Low-sodium or reduced-sodium tomato and vegetable juice. Fruits Fresh, frozen, or canned fruit. Fruit juice. Meats and other protein foods Fresh or frozen (no salt added) meat, poultry, seafood, and fish. Low-sodium canned tuna and salmon. Unsalted nuts. Dried peas, beans, and lentils without added salt. Unsalted canned beans. Eggs. Unsalted nut butters. Dairy Milk. Soy milk. Cheese that is naturally low in sodium, such as ricotta cheese, fresh mozzarella, or Swiss cheese Low-sodium or reduced-sodium cheese. Cream cheese. Yogurt. Fats and oils Unsalted butter. Unsalted margarine with no trans fat. Vegetable oils such as canola or olive oils. Seasonings and other foods Fresh and dried herbs and spices. Salt-free seasonings. Low-sodium mustard and ketchup. Sodium-free salad dressing. Sodium-free light mayonnaise. Fresh or refrigerated horseradish. Lemon juice. Vinegar. Homemade, reduced-sodium, or low-sodium soups. Unsalted popcorn and pretzels. Low-salt or salt-free chips. What foods are not recommended? The items listed may not be a complete list. Talk with your dietitian about what dietary choices are best for you. Grains Instant hot cereals. Bread stuffing, pancake, and biscuit mixes. Croutons. Seasoned rice or pasta mixes. Noodle soup cups. Boxed or frozen macaroni and cheese. Regular salted crackers. Self-rising flour. Vegetables Sauerkraut, pickled vegetables, and relishes. Olives. [place] fries. Onion rings. Regular canned vegetables (not low-sodium or reduced-sodium). Regular canned tomato sauce and paste (not low-sodium or reduced-sodium). Regular tomato and vegetable juice (not low-sodium or reduced-sodium). Frozen vegetables in sauces. Meats and other protein foods Meat or fish that is salted, canned, smoked, spiced, or pickled. Bacon, ham, sausage, hotdogs, corned beef, chipped beef, packaged lunch meats, salt pork, jerky, pickled herring, anchovies, regular canned  tuna, sardines, salted nuts. Dairy Processed cheese and cheese spreads. Cheese curds. Blue cheese. Feta cheese. String cheese. Regular cottage cheese. Buttermilk. Canned milk. Fats and oils Salted butter. Regular margarine. Ghee. Bacon fat. Seasonings and other foods Onion salt, garlic salt, seasoned salt, table salt, and sea salt. Canned and packaged gravies. Worcestershire sauce. Tartar sauce. Barbecue sauce. Teriyaki sauce. Soy sauce, including reduced-sodium. Steak sauce. Fish sauce. Oyster sauce. Cocktail sauce. Horseradish that you find on the shelf. Regular ketchup and mustard. Meat flavorings and tenderizers. Bouillon cubes. Hot sauce and Tabasco sauce. Premade or packaged marinades. Premade or packaged taco seasonings. Relishes. Regular salad dressings. Salsa. Potato and tortilla chips. Corn chips and puffs. Salted popcorn and pretzels. Canned or dried soups. Pizza. Frozen entrees and pot pies. Summary  Eating less sodium can help lower your blood pressure, reduce swelling, and protect your heart, liver, and kidneys.  Most people on this plan should limit their sodium intake to 1,500-2,000 mg (milligrams) of sodium each day.  Canned, boxed, and frozen foods are high in sodium. Restaurant foods, fast foods, and pizza are also very high in sodium. You also get sodium by adding salt to food.  Try to cook at home, eat more fresh fruits and vegetables, and eat less fast food, canned, processed, or prepared foods. This information is not intended to replace advice given to you by your health care provider. Make sure you discuss any questions you have with your health care provider. Document Released: 02/25/2002 Document Revised: 08/29/2016 Document Reviewed: 08/29/2016 Elsevier Interactive Patient  Education  2019 [employer].

## 2019-02-14 NOTE — Progress Notes (Signed)
Virtual Visit via Video Note   This visit type was conducted due to national recommendations for restrictions regarding the COVID-19 Pandemic (e.g. social distancing) in an effort to limit this patient's exposure and mitigate transmission in our community.  Due to her co-morbid illnesses, this patient is at least at moderate risk for complications without adequate follow up.  This format is felt to be most appropriate for this patient at this time.  All issues noted in this document were discussed and addressed.  A limited physical exam was performed with this format.  Please refer to the patient's chart for her consent to telehealth for [facility].  She was contacted for a video visit but due to technical issues on her end it had to be changed to a phone visit.   Date:  02/14/2019   ID:  Hannah Khan, DOB July 09, 1956, MRN [id number]  Patient Location: Home Provider Location: Office  PCP:  Lujean Amel, MD  Cardiologist:  Skeet Latch, MD  Electrophysiologist:  None   Evaluation Performed:  Follow-Up Visit  Chief Complaint:    History of Present Illness:    Hannah Khan is a pleasant 63 y.o. 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  She has a history of atrial fibrillation, initially diagnosed in March 2017, now felt to be chronic. She is not anticoagulated because of her thrombocytopenia and portal hypertension.  She has been seen by Dr. Rayann Heman for consideration of a watchman device but he did not feel she was a candidate secondary to chronic thrombocytopenia.    Other medical problems include hypertension, with a tendency to hypotension, NIDDM, cryptogenic cirrhosis with portal hypertension, thrombocytopenia, morbid obesity with a BMI of 55, and sleep apnea on CPAP.   Echocardiogram in the past has showed normal LV function.    The patient has been in atrial fibrillation on the last several office visits, we assume this is now chronic  atrial fibrillation.  She has had problems with rapid rates when she was hospitalized for various issues over the past year.  Diltiazem works best for her although at times she has had some problems with hypotension.  She has issues with chronic lower extremity edema, suspected to be a combination of diastolic heart failure and diltiazem.  She takes Bumex 2 mg daily.  Recently she has had an increase in her edema and her weight.  A few weeks ago her weight had drifted up to 360 pounds and her edema had markedly worsened.  She was advised to increase her Bumex to 4 mg a day.  Her weight gradually came down on this dose to 348 pounds when I contacted her for a tele visit 01/18/2019.  I suggested we continue with Bumex 4 mg until she came down closer to 340 lbs.   She was contacted today for follow up.  She has been doing well.  Her weight recently bumped up a little after she had eaten out.  She denies any usual dyspnea.   The patient does not have symptoms concerning for COVID-19 infection (fever, chills, cough, or new shortness of breath).    Past Medical History:  Diagnosis Date  . Anemia    hx of  . Arthritis    knees  . Diabetes mellitus without complication ([place])    type 2  . Fibromyalgia   . HTN (hypertension)   . Hx of colonic polyps    s/p partial colectomy  . Hyperlipidemia   .  Joint pain    in knees and back spasms  . Left leg swelling    wear compression hose  . Liver cirrhosis secondary to NASH (nonalcoholic steatohepatitis) ([place]) dx nov 2014   had enlarged spleen also   . Low blood pressure    bp varies  . Morbid obesity ([place])   . Persistent atrial fibrillation   . Pneumonia 10/2016  . PONV (postoperative nausea and vomiting)    in past none recent  . Portal hypertension ([place])   . Sleep apnea    uses cpap setting of 14  . Spleen enlarged   . 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 [facility])    Past Surgical History:  Procedure Laterality Date  . BREAST LUMPECTOMY WITH  RADIOACTIVE SEED LOCALIZATION Right 08/29/2017   Procedure: RIGHT BREAST LUMPECTOMY WITH RADIOACTIVE SEEDS LOCALIZATION;  Surgeon: Erroll Luna, MD;  Location: [place];  Service: General;  Laterality: Right;  . CESAREAN SECTION  1989  . CHOLECYSTECTOMY  20 yrs ago  . COLECTOMY  2011  . COLONOSCOPY    . COLONOSCOPY N/A 09/05/2018   Procedure: COLONOSCOPY;  Surgeon: [name], MD;  Location: [facility];  Service: Endoscopy;  Laterality: N/A;  . DILATATION & CURETTAGE/HYSTEROSCOPY WITH MYOSURE N/A 05/06/2016   Procedure: DILATATION & CURETTAGE/HYSTEROSCOPY WITH MYOSURE WITH POLYPECTOMY;  Surgeon: Janyth Pupa, DO;  Location: [facility];  Service: Gynecology;  Laterality: N/A;  . ESOPHAGOGASTRODUODENOSCOPY (EGD) WITH PROPOFOL N/A 09/04/2013   Procedure: ESOPHAGOGASTRODUODENOSCOPY (EGD) WITH PROPOFOL;  Surgeon: [name], MD;  Location: [facility];  Service: Endoscopy;  Laterality: N/A;  . KNEE ARTHROSCOPY  yrs ago   bilateral, one done x 1, one done twice  . POLYPECTOMY  09/05/2018   Procedure: POLYPECTOMY;  Surgeon: [name], MD;  Location: [facility];  Service: Endoscopy;;     Current Meds  Medication Sig  . albuterol (PROVENTIL HFA;VENTOLIN HFA) 108 (90 Base) MCG/ACT inhaler TAKE 2 PUFFS INTO LUNGS EVERY 6 HOURS AS NEEDED  . bumetanide (BUMEX) 2 MG tablet Take 2-3 mg by mouth 2 (two) times daily as needed (fluid).   . calcium carbonate (OS-CAL - DOSED IN MG OF ELEMENTAL CALCIUM) 1250 (500 Ca) MG tablet Take 1 tablet by mouth daily with breakfast.   . diltiazem (CARDIZEM CD) 300 MG 24 hr capsule TAKE 1 CAPSULE BY MOUTH EVERY DAY (Patient taking differently: Take 300 mg by mouth daily. TAKE 1 CAPSULE BY MOUTH EVERY DAY)  . JARDIANCE 25 MG TABS tablet Take 25 mg by mouth daily after breakfast.   . KLOR-CON M20 20 MEQ tablet Take 1 tablet (20 mEq total) by mouth daily.  . propranolol (INDERAL) 10 MG tablet Take 1 tablet (10 mg total) by mouth 2 (two) times daily.  Marland Kitchen  spironolactone (ALDACTONE) 25 MG tablet Take 25 mg by mouth daily.     Allergies:   Celecoxib; Shellfish allergy; Barium-containing compounds; Prednisone; Statins; Fentanyl; Ibuprofen; Tramadol; and Tylenol [acetaminophen]   Social History   Tobacco Use  . Smoking status: Former Smoker    Packs/day: 1.00    Years: 3.00    Pack years: 3.00    Types: Cigarettes    Last attempt to quit: 09/19/1974    Years since quitting: 44.4  . Smokeless tobacco: Never Used  Substance Use Topics  . Alcohol use: Yes    Comment: occ  . Drug use: No     Family Hx: [name] family history includes Cancer in her brother, father, and mother; Congestive Heart Failure in her mother; Heart  failure in her mother; Hyperlipidemia in her brother and mother; Lung cancer in her father; Stroke in her mother and another family member.  ROS:   Please see the history of present illness.    All other systems reviewed and are negative.   Prior CV studies:   The following studies were reviewed today:   Labs/Other Tests and Data Reviewed:    EKG:  No ECG reviewed.  Recent Labs: 04/26/2018: Magnesium 1.9 06/08/2018: NT-Pro BNP 253 09/15/2018: ALT 24 09/16/2018: B Natriuretic Peptide 153.6; Hemoglobin 12.4; Platelets 48 01/17/2019: BUN 16; Creatinine, Ser 0.84; Potassium 3.8; Sodium 142   Recent Lipid Panel Lab Results  Component Value Date/Time   CHOL 175 07/02/2014 11:38 AM   TRIG 93 07/02/2014 11:38 AM   HDL 59 07/02/2014 11:38 AM   CHOLHDL 3.0 07/02/2014 11:38 AM   LDLCALC 97 07/02/2014 11:38 AM    Wt Readings from Last 3 Encounters:  02/14/19 (!) 344 lb (156 kg)  01/18/19 (!) 348 lb (157.9 kg)  11/16/18 (!) 353 lb 3.2 oz (160.2 kg)     Objective:    Vital Signs:  BP 95/60   Pulse 94   Ht 5' 7"  (1.702 m)   Wt (!) 344 lb (156 kg)   BMI 53.88 kg/m    VITAL SIGNS:  reviewed Pt in no acute distress ASSESSMENT & PLAN:    CAF- Fair rate control. Diltiazem seems to work best for her,  continue current dose  LE edema- Multifactorial- obesity, sedentary, Diltiazem, and possible diastolic dysfunction secondary to AF  Morbid obesity- BMI-55  OSA- On C-pap  Cryptogenic cirrhosis with portal hypertension and thrombocytopenia- Not a candidate for anticoagulation  COVID-19 Education: The signs and symptoms of COVID-19 were discussed with the patient and how to seek care for testing (follow up with PCP or arrange E-visit).  The importance of social distancing was discussed today.  Time:   Today, I have spent 10 minutes with the patient with telehealth technology discussing the above problems.     Medication Adjustments/Labs and Tests Ordered: Current medicines are reviewed at length with the patient today.  Concerns regarding medicines are outlined above.   Tests Ordered: No orders of the defined types were placed in this encounter.   Medication Changes: No orders of the defined types were placed in this encounter.   Disposition:  Follow up Dr Oval Linsey in the Fall  Signed, Kerin Ransom, Hershal Coria  02/14/2019 4:36 PM    [place]

## 2019-02-14 NOTE — Telephone Encounter (Signed)
New Message     Pt is calling about her appt that is scheduled today     Please call

## 2019-02-14 NOTE — Telephone Encounter (Signed)
Patient seen for virtual visit today.

## 2019-02-14 NOTE — Telephone Encounter (Signed)
Left message for patient with AVS instructions gave Luke's recommendations and advised patient to contact the office with any questions or concerns.

## 2019-02-19 ENCOUNTER — Other Ambulatory Visit: Payer: Self-pay | Admitting: Gastroenterology

## 2019-02-19 DIAGNOSIS — K7460 Unspecified cirrhosis of liver: Secondary | ICD-10-CM

## 2019-03-03 ENCOUNTER — Other Ambulatory Visit: Payer: Self-pay | Admitting: Cardiovascular Disease

## 2019-03-05 ENCOUNTER — Ambulatory Visit
Admission: RE | Admit: 2019-03-05 | Discharge: 2019-03-05 | Disposition: A | Discharge status: Home / Self Care | Payer: BC Managed Care – PPO | Source: Ambulatory Visit | Attending: Gastroenterology | Admitting: Gastroenterology

## 2019-03-05 DIAGNOSIS — K7460 Unspecified cirrhosis of liver: Secondary | ICD-10-CM

## 2019-03-08 ENCOUNTER — Other Ambulatory Visit: Payer: Self-pay | Admitting: Orthopedic Surgery

## 2019-03-08 DIAGNOSIS — G8929 Other chronic pain: Secondary | ICD-10-CM

## 2019-03-18 NOTE — Telephone Encounter (Signed)
Opened in error

## 2019-05-31 ENCOUNTER — Other Ambulatory Visit: Payer: Self-pay | Admitting: Physician Assistant

## 2019-05-31 NOTE — Telephone Encounter (Signed)
This is Dr. Blenda Mounts pt.

## 2019-06-07 ENCOUNTER — Other Ambulatory Visit: Payer: Self-pay

## 2019-06-07 ENCOUNTER — Inpatient Hospital Stay: Payer: BC Managed Care – PPO | Attending: Oncology

## 2019-06-07 ENCOUNTER — Inpatient Hospital Stay: Payer: BC Managed Care – PPO | Admitting: Oncology

## 2019-06-07 VITALS — BP 140/69 | HR 97 | Temp 98.3°F | Resp 18 | Ht 67.0 in | Wt 367.5 lb

## 2019-06-07 DIAGNOSIS — Z79899 Other long term (current) drug therapy: Secondary | ICD-10-CM | POA: Diagnosis not present

## 2019-06-07 DIAGNOSIS — D696 Thrombocytopenia, unspecified: Secondary | ICD-10-CM | POA: Insufficient documentation

## 2019-06-07 DIAGNOSIS — K7460 Unspecified cirrhosis of liver: Secondary | ICD-10-CM | POA: Insufficient documentation

## 2019-06-07 DIAGNOSIS — I4891 Unspecified atrial fibrillation: Secondary | ICD-10-CM | POA: Diagnosis not present

## 2019-06-07 LAB — CBC WITH DIFFERENTIAL (CANCER CENTER ONLY)
Abs Immature Granulocytes: 0.01 10*3/uL (ref 0.00–0.07)
Basophils Absolute: 0 10*3/uL (ref 0.0–0.1)
Basophils Relative: 1 %
Eosinophils Absolute: 0.1 10*3/uL (ref 0.0–0.5)
Eosinophils Relative: 2 %
HCT: 42.9 % (ref 36.0–46.0)
Hemoglobin: 14.2 g/dL (ref 12.0–15.0)
Immature Granulocytes: 0 %
Lymphocytes Relative: 23 %
Lymphs Abs: 1 10*3/uL (ref 0.7–4.0)
MCH: 31.1 pg (ref 26.0–34.0)
MCHC: 33.1 g/dL (ref 30.0–36.0)
MCV: 93.9 fL (ref 80.0–100.0)
Monocytes Absolute: 0.4 10*3/uL (ref 0.1–1.0)
Monocytes Relative: 11 %
Neutro Abs: 2.7 10*3/uL (ref 1.7–7.7)
Neutrophils Relative %: 63 %
Platelet Count: 33 10*3/uL — ABNORMAL LOW (ref 150–400)
RBC: 4.57 MIL/uL (ref 3.87–5.11)
RDW: 15 % (ref 11.5–15.5)
WBC Count: 4.2 10*3/uL (ref 4.0–10.5)
nRBC: 0 % (ref 0.0–0.2)

## 2019-06-07 NOTE — Progress Notes (Signed)
Hematology and Oncology Follow Up Visit  Hannah Khan [id number] 05-20-1956 63 y.o. 06/07/2019 2:49 PM  CC: Hannah Silence, MD Hannah Khan, M.D. Hannah Khan. Hannah Pulling, MD Hannah Khan, M.D.    Principle Diagnosis: 63 year old woman with thrombocytopenia diagnosed and 2010 related to the right of the liver.    Current therapy: Active surveillance  Interim History: [name] is here for a follow-up visit.  She reports no major changes in her health.  She denies any recent hospitalization or illnesses.  She denies any surgeries or falls.  Her right arm continues to improve with physical therapy without any requirements of surgery.  She denies any epistaxis, hematochezia or melena.  She continues to be ambulatory and continues to work full-time.  She denied any alteration mental status, neuropathy, confusion or dizziness.  Denies any headaches or lethargy.  Denies any night sweats, weight loss or changes in appetite.  Denied orthopnea, dyspnea on exertion or chest discomfort.  Denies shortness of breath, difficulty breathing hemoptysis or cough.  Denies any abdominal distention, nausea, early satiety or dyspepsia.  Denies any hematuria, frequency, dysuria or nocturia.  Denies any skin irritation, dryness or rash.  Denies any ecchymosis or petechiae.  Denies any lymphadenopathy or clotting.  Denies any heat or cold intolerance.  Denies any anxiety or depression.  Remaining review of system is negative.        Medications: Updated on review. Current Outpatient Medications  Medication Sig Dispense Refill  . albuterol (PROVENTIL HFA;VENTOLIN HFA) 108 (90 Base) MCG/ACT inhaler TAKE 2 PUFFS INTO LUNGS EVERY 6 HOURS AS NEEDED    . bumetanide (BUMEX) 2 MG tablet TAKE 1 TABLET BY MOUTH EVERY DAY 90 tablet 3  . calcium carbonate (OS-CAL - DOSED IN MG OF ELEMENTAL CALCIUM) 1250 (500 Ca) MG tablet Take 1 tablet by mouth daily with breakfast.     . diltiazem (CARDIZEM CD) 300 MG 24 hr capsule  TAKE 1 CAPSULE BY MOUTH EVERY DAY (Patient taking differently: Take 300 mg by mouth daily. TAKE 1 CAPSULE BY MOUTH EVERY DAY) 90 capsule 3  . JARDIANCE 25 MG TABS tablet Take 25 mg by mouth daily after breakfast.   5  . KLOR-CON M20 20 MEQ tablet Take 1 tablet (20 mEq total) by mouth daily. 90 tablet 3  . propranolol (INDERAL) 10 MG tablet TAKE 1 TABLET BY MOUTH TWICE A DAY 180 tablet 2  . spironolactone (ALDACTONE) 25 MG tablet Take 25 mg by mouth daily.     No current facility-administered medications for this visit.     Allergies:  Allergies  Allergen Reactions  . Celecoxib Other (See Comments)    "speech slurred" with celebrex  . Shellfish Allergy Swelling and Rash    TINGLING AND SWELLING OF LIPS. LARGE WHELPS QUARTER-SIZE  . Barium-Containing Compounds Other (See Comments)    TACHYCARDIA  . Prednisone Other (See Comments)    TACHYCARDIA  . Statins Other (See Comments)    AGGRAVATED FIBROMYALGIA  . Fentanyl     Hallucinations   . Ibuprofen Other (See Comments)    UNSPECIFIED SPECIFIC REACTION >> "DUE TO PLATELETS"  . Tramadol     Hallucination   . Tylenol [Acetaminophen] Other (See Comments)    UNSPECIFIED SPECIFIC REACTION >> "DUE TO PLATELETS"    Past Medical History, Surgical history, Social history, and Family History without any changes on review.   Physical Exam:  Blood pressure 140/69, pulse 97, temperature 98.3 F (36.8 C), temperature source Oral, resp. rate 18, height  5' 7"  (1.702 m), weight (!) 367 lb 8 oz (166.7 kg), SpO2 95 %.    ECOG: 1    General appearance: Comfortable appearing without any discomfort Head: Normocephalic without any trauma  Oropharynx: Mucous membranes are moist and pink without any thrush or ulcers. Eyes: Pupils are equal and round reactive to light. Lymph nodes: No cervical, supraclavicular, inguinal or axillary lymphadenopathy.   Heart:regular rate and rhythm.  S1 and S2 without leg edema. Lung: Clear without any rhonchi  or wheezes.  No dullness to percussion. Abdomin: Soft, nontender, nondistended with good bowel sounds.  No hepatosplenomegaly. Musculoskeletal: No joint deformity or effusion.  Full range of motion noted. Neurological: No deficits noted on motor, sensory and deep tendon reflex exam. Skin: No petechial rash or dryness.  Appeared moist.       Lab Results: Lab Results  Component Value Date   WBC 4.6 09/16/2018   HGB 12.4 09/16/2018   HCT 39.1 09/16/2018   MCV 94.0 09/16/2018   PLT 48 (L) 09/16/2018     Chemistry      Component Value Date/Time   NA 142 01/17/2019 0821   NA 142 08/01/2014 1509   K 3.8 01/17/2019 0821   K 4.1 08/01/2014 1509   CL 103 01/17/2019 0821   CL 107 08/03/2012 1449   CO2 24 01/17/2019 0821   CO2 23 08/01/2014 1509   BUN 16 01/17/2019 0821   BUN 16.9 08/01/2014 1509   CREATININE 0.84 01/17/2019 0821   CREATININE 0.77 06/28/2016 1157   CREATININE 0.7 08/01/2014 1509      Component Value Date/Time   CALCIUM 8.9 01/17/2019 0821   CALCIUM 9.3 08/01/2014 1509   ALKPHOS 77 09/15/2018 2131   ALKPHOS 82 08/01/2014 1509   AST 42 (H) 09/15/2018 2131   AST 54 (H) 08/01/2014 1509   ALT 24 09/15/2018 2131   ALT 30 08/01/2014 1509   BILITOT 2.7 (H) 09/15/2018 2131   BILITOT 1.71 (H) 08/01/2014 5236       63 year old woman with:  1.  Thrombocytopenia related to cirrhosis of the liver and splenic sequestration diagnosed in 2010.    Platelet count today was 33 which is close to baseline.  She has no active bleeding at this time or skin change including petechia or ecchymosis.  The natural course of this disease as well as treatment options were reviewed.  Platelet stimulating factor will be a reasonable option for her if he is to have surgery.  He is not a risk of spontaneous bleeding without any intervention.  2.  Humerus fracture: Improved at this time with physical therapy and improved range of motion.  3.  Cirrhosis of the liver: No recent  decompensation noted at this time.  4.  Atrial fibrillation: No recent exacerbation noted.  5.  Follow-up: She will return in 8 months for a follow-up.  15  minutes was spent with the patient face-to-face today.  More than 50% of time was spent on updating her disease status and treatment options and answering questions regarding plan of care.      Zola Button 9/18/20202:49 PM

## 2019-06-12 ENCOUNTER — Telehealth: Payer: Self-pay | Admitting: Oncology

## 2019-06-12 NOTE — Telephone Encounter (Signed)
Called and left msg. Mailed printout  °

## 2019-08-13 ENCOUNTER — Other Ambulatory Visit: Payer: Self-pay | Admitting: Nurse Practitioner

## 2019-08-13 ENCOUNTER — Other Ambulatory Visit (HOSPITAL_COMMUNITY)
Admission: RE | Admit: 2019-08-13 | Discharge: 2019-08-13 | Disposition: A | Discharge status: Home / Self Care | Payer: BC Managed Care – PPO | Source: Ambulatory Visit | Attending: Nurse Practitioner | Admitting: Nurse Practitioner

## 2019-08-13 ENCOUNTER — Other Ambulatory Visit: Payer: Self-pay

## 2019-08-13 DIAGNOSIS — Z124 Encounter for screening for malignant neoplasm of cervix: Secondary | ICD-10-CM | POA: Diagnosis not present

## 2019-08-14 ENCOUNTER — Other Ambulatory Visit: Payer: Self-pay | Admitting: Nurse Practitioner

## 2019-08-14 ENCOUNTER — Other Ambulatory Visit: Payer: Self-pay | Admitting: Cardiovascular Disease

## 2019-08-14 DIAGNOSIS — N950 Postmenopausal bleeding: Secondary | ICD-10-CM

## 2019-08-16 LAB — CERVICOVAGINAL ANCILLARY ONLY: HPV: NOT DETECTED

## 2019-08-19 LAB — CYTOLOGY - PAP
Comment: NEGATIVE
Diagnosis: NEGATIVE
High risk HPV: NEGATIVE

## 2019-08-26 ENCOUNTER — Ambulatory Visit
Admission: RE | Admit: 2019-08-26 | Discharge: 2019-08-26 | Disposition: A | Discharge status: Home / Self Care | Payer: BC Managed Care – PPO | Source: Ambulatory Visit | Attending: Nurse Practitioner | Admitting: Nurse Practitioner

## 2019-08-26 DIAGNOSIS — N950 Postmenopausal bleeding: Secondary | ICD-10-CM

## 2019-09-12 ENCOUNTER — Other Ambulatory Visit: Payer: Self-pay

## 2019-09-12 MED ORDER — SPIRONOLACTONE 25 MG PO TABS: 25.0000 mg | ORAL_TABLET | Freq: Every day | ORAL | 0 refills | Status: DC

## 2019-10-31 ENCOUNTER — Other Ambulatory Visit: Payer: Self-pay | Admitting: Cardiovascular Disease

## 2019-11-16 IMAGING — CT CT CERVICAL SPINE W/O CM
4 of 7 series · 13 of 33 positions shown, 14 images · non-contrast
Comparison: 03/20/2018

CLINICAL DATA: Lethargy and weakness. Recent platelet transfusion
with diarrhea and nausea shortly thereafter.

EXAM:
CT HEAD WITHOUT CONTRAST
CT CERVICAL SPINE WITHOUT CONTRAST
TECHNIQUE: Multidetector CT imaging of the head and cervical spine was
performed following the standard protocol without intravenous
contrast. Multiplanar CT image reconstructions of the cervical spine
were also generated.

[Series 9: c spine soft · axial · 0.32mm/px · z∈[-747,-633]mm · 4 of 96 slices shown]
[im 20/96  soft-tissue]
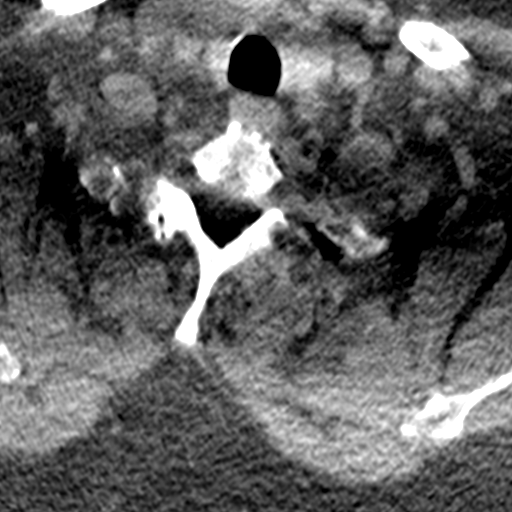
[im 39/96  soft-tissue]
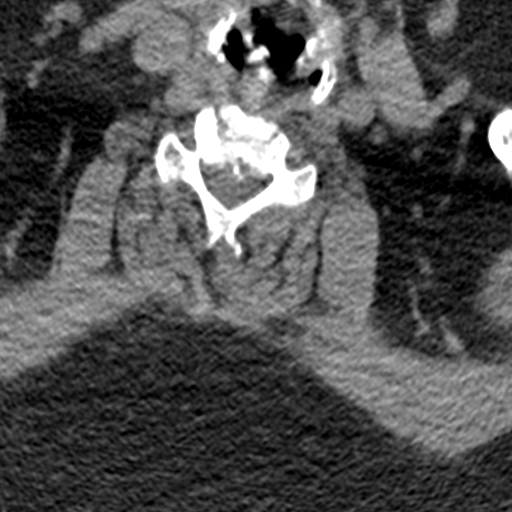
[im 58/96  soft-tissue]
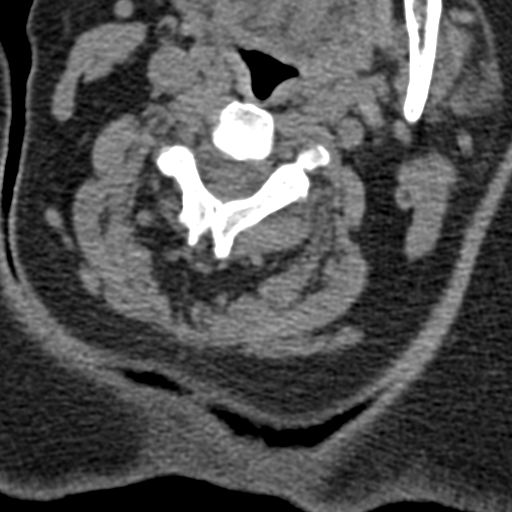
[im 77/96  soft-tissue]
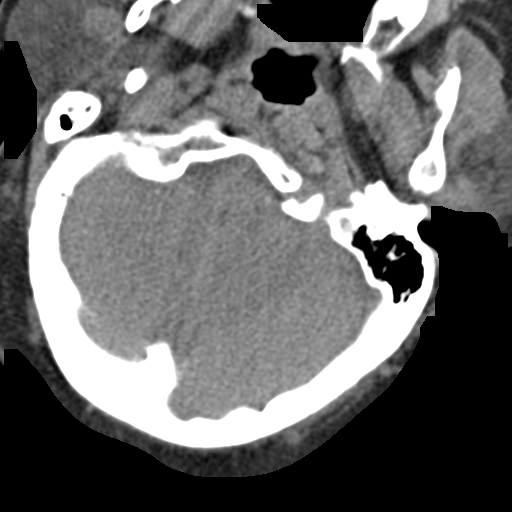

[Series 11: orthogonal bone · axial · 0.27mm/px · z∈[-752,-644]mm · 4 of 90 slices shown, 5 images]
[im 18/90  soft-tissue]
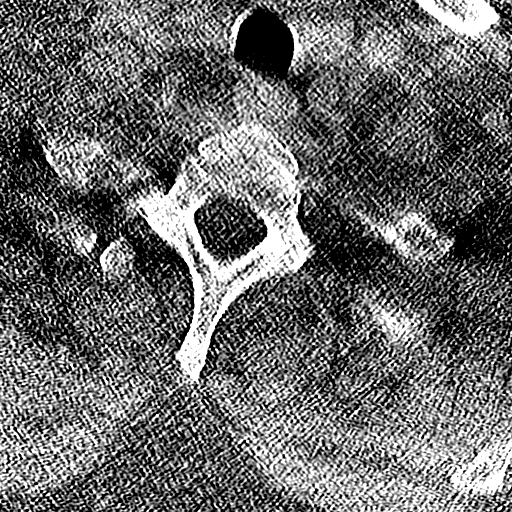
[im 18/90  bone]
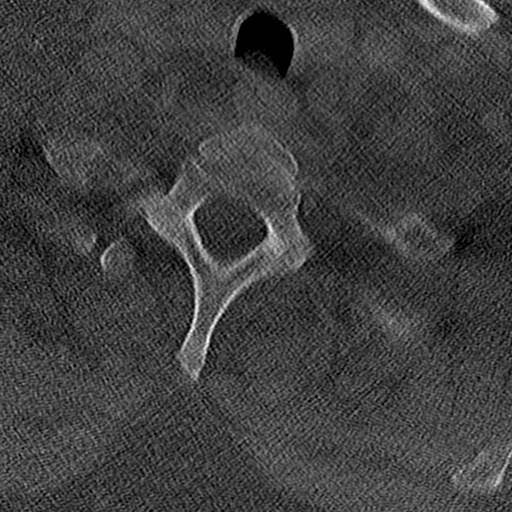
[im 36/90  bone]
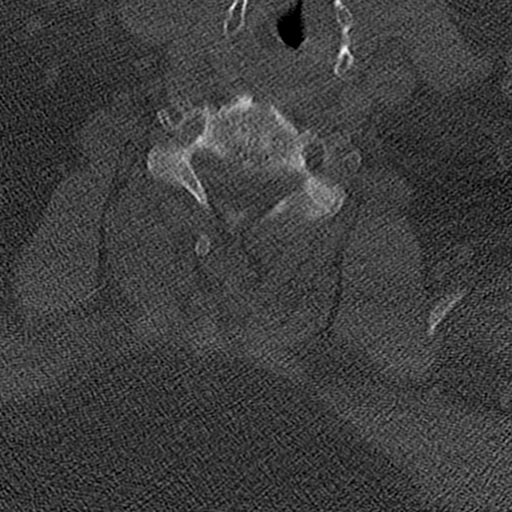
[im 54/90  bone]
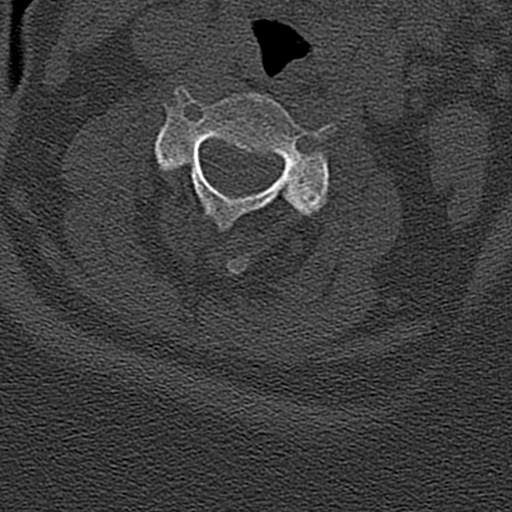
[im 72/90  bone]
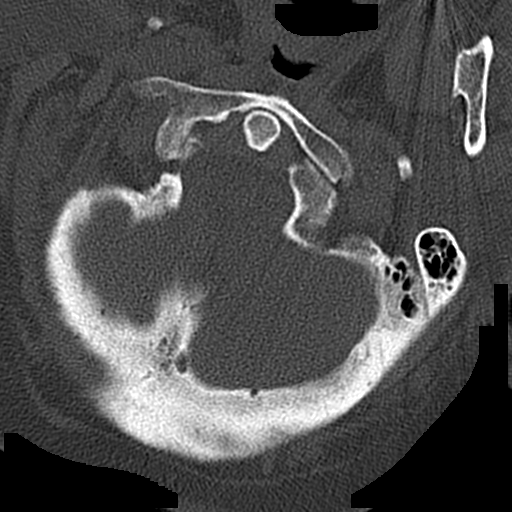

[Series 12: coronal bone · coronal · 0.28mm/px · 2 of 61 slices shown]
[im 21/61  bone]
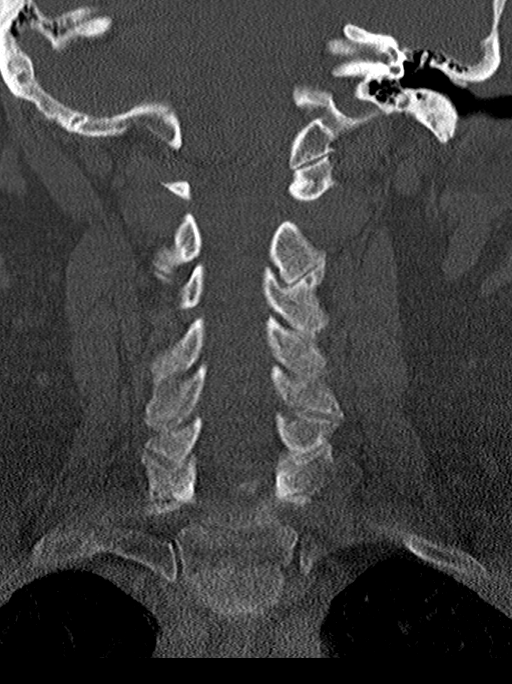
[im 41/61  bone]
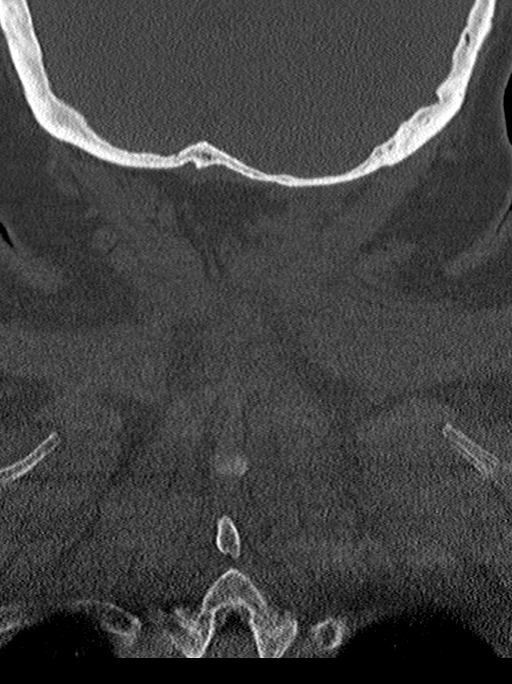

[Series 13: sagittal bone · sagittal · 0.28mm/px · 3 of 61 slices shown]
[im 16/61  bone]
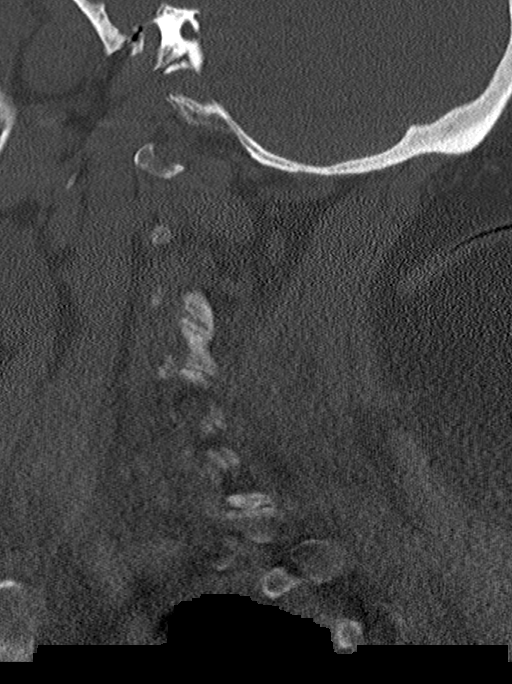
[im 31/61  bone]
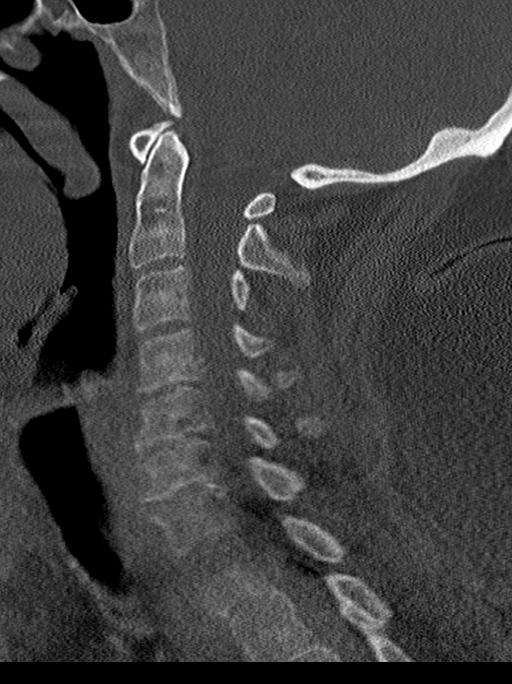
[im 46/61  bone]
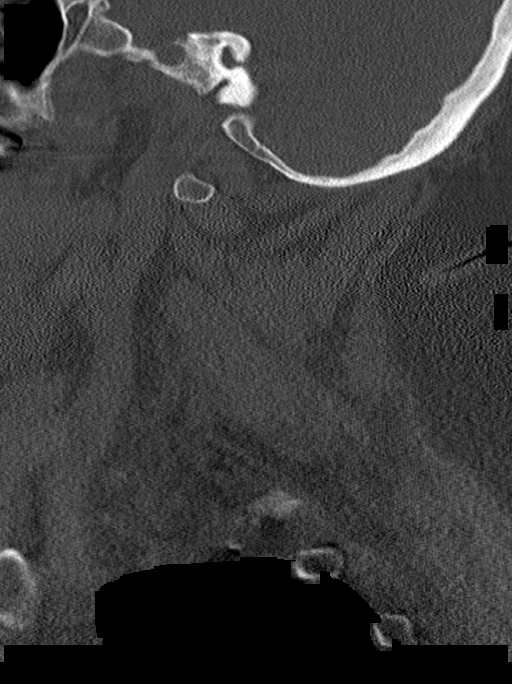

[13 of 33 positions shown; findings below may reference images not displayed]

FINDINGS: CT HEAD FINDINGS

BRAIN: The ventricles and sulci are normal. No intraparenchymal
hemorrhage, mass effect nor midline shift. No acute large vascular
territory infarcts. Grey-white matter distinction is maintained. The
basal ganglia are unremarkable. No abnormal extra-axial fluid
collections. Basal cisterns are not effaced and midline. The
brainstem and cerebellar hemispheres are without acute
abnormalities.

VASCULAR: Unremarkable.

SKULL/SOFT TISSUES: No skull fracture. No significant soft tissue
swelling.

ORBITS/SINUSES: The included ocular globes and orbital contents are
normal.The mastoid air cells are clear. The included paranasal
sinuses are well-aerated.

OTHER: None.

CT CERVICAL SPINE FINDINGS

Alignment: Maintained cervical lordosis.

Skull base and vertebrae: No acute fracture. No primary bone lesion
or focal pathologic process.

Soft tissues and spinal canal: No prevertebral fluid or swelling. No
visible canal hematoma.

Disc levels: Disc space narrowing C4-5, C5-6 and C6-7 with
uncovertebral joint osteoarthritis. No significant central canal
stenosis. Mild bilateral foraminal encroachment at C5-6 and C6-7

Upper chest: Negative.

Other: None
IMPRESSION: 1. No acute intracranial abnormality.
2. Cervical degenerative disc disease C4 through C7 with
uncovertebral joint osteoarthritis and uncinate spurring. No acute
osseous appearing abnormality.

## 2019-11-16 IMAGING — CR DG SHOULDER 2+V*R*
3 series · 3 of 3 positions shown · non-contrast
Comparison: Right shoulder 09/28/2017 and CT 08/28/2018

CLINICAL DATA: Patient fell at home and has increasing pain in the
right arm.

EXAM:
RIGHT SHOULDER - 2+ VIEW

[x shoulder ap right (1 of 3)]
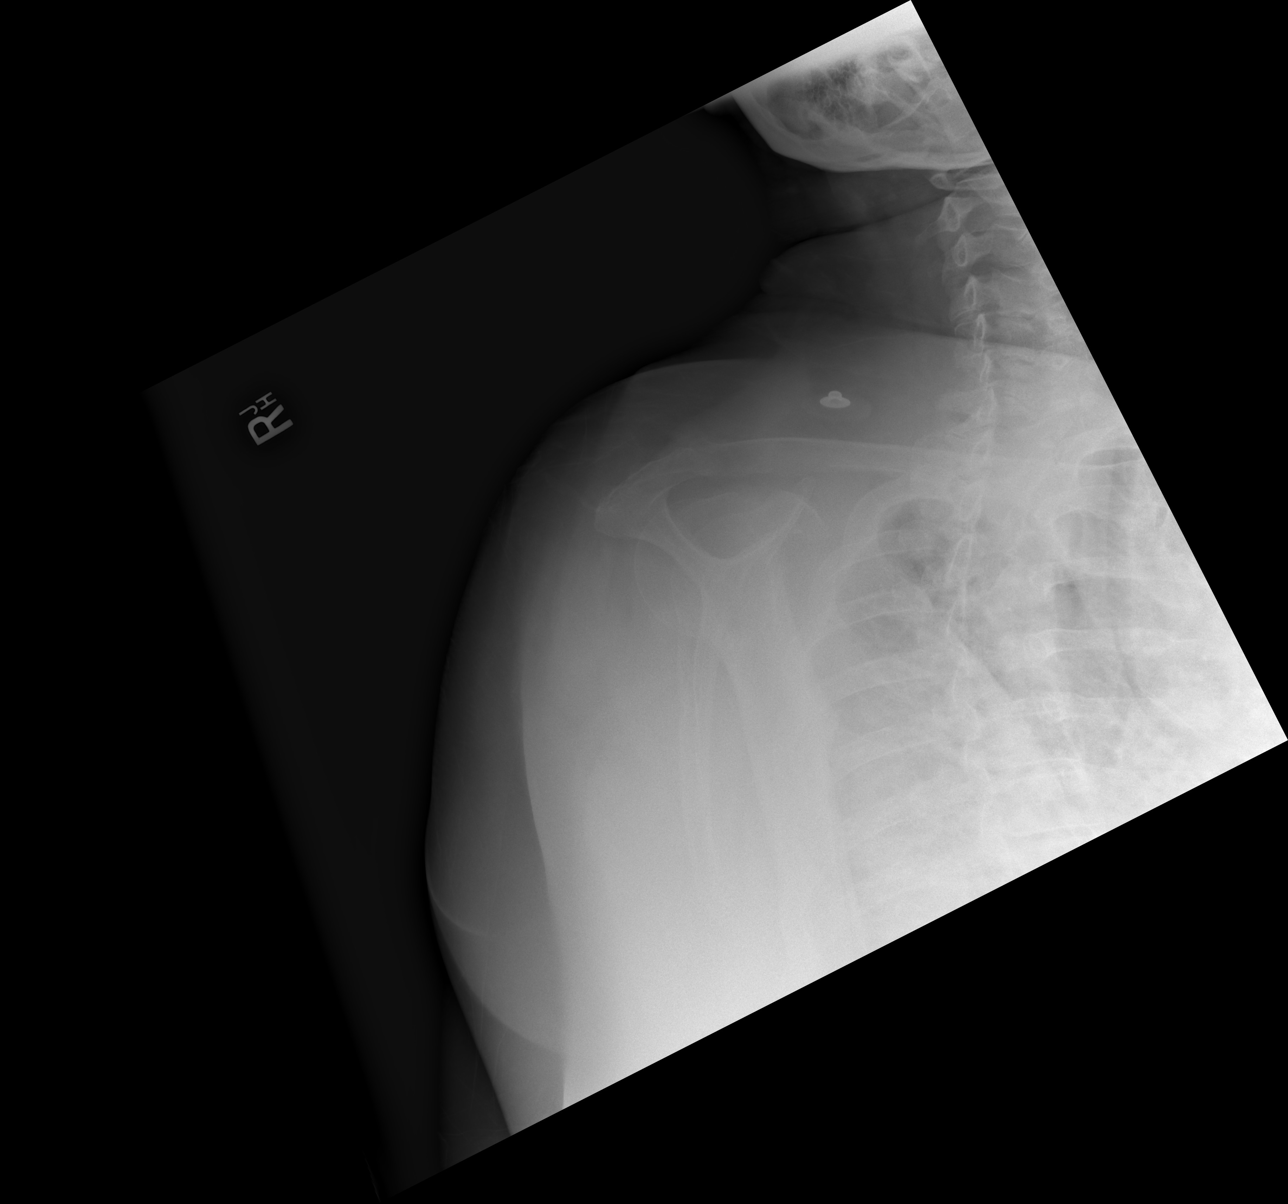

[x shoulder ap right (2 of 3)]
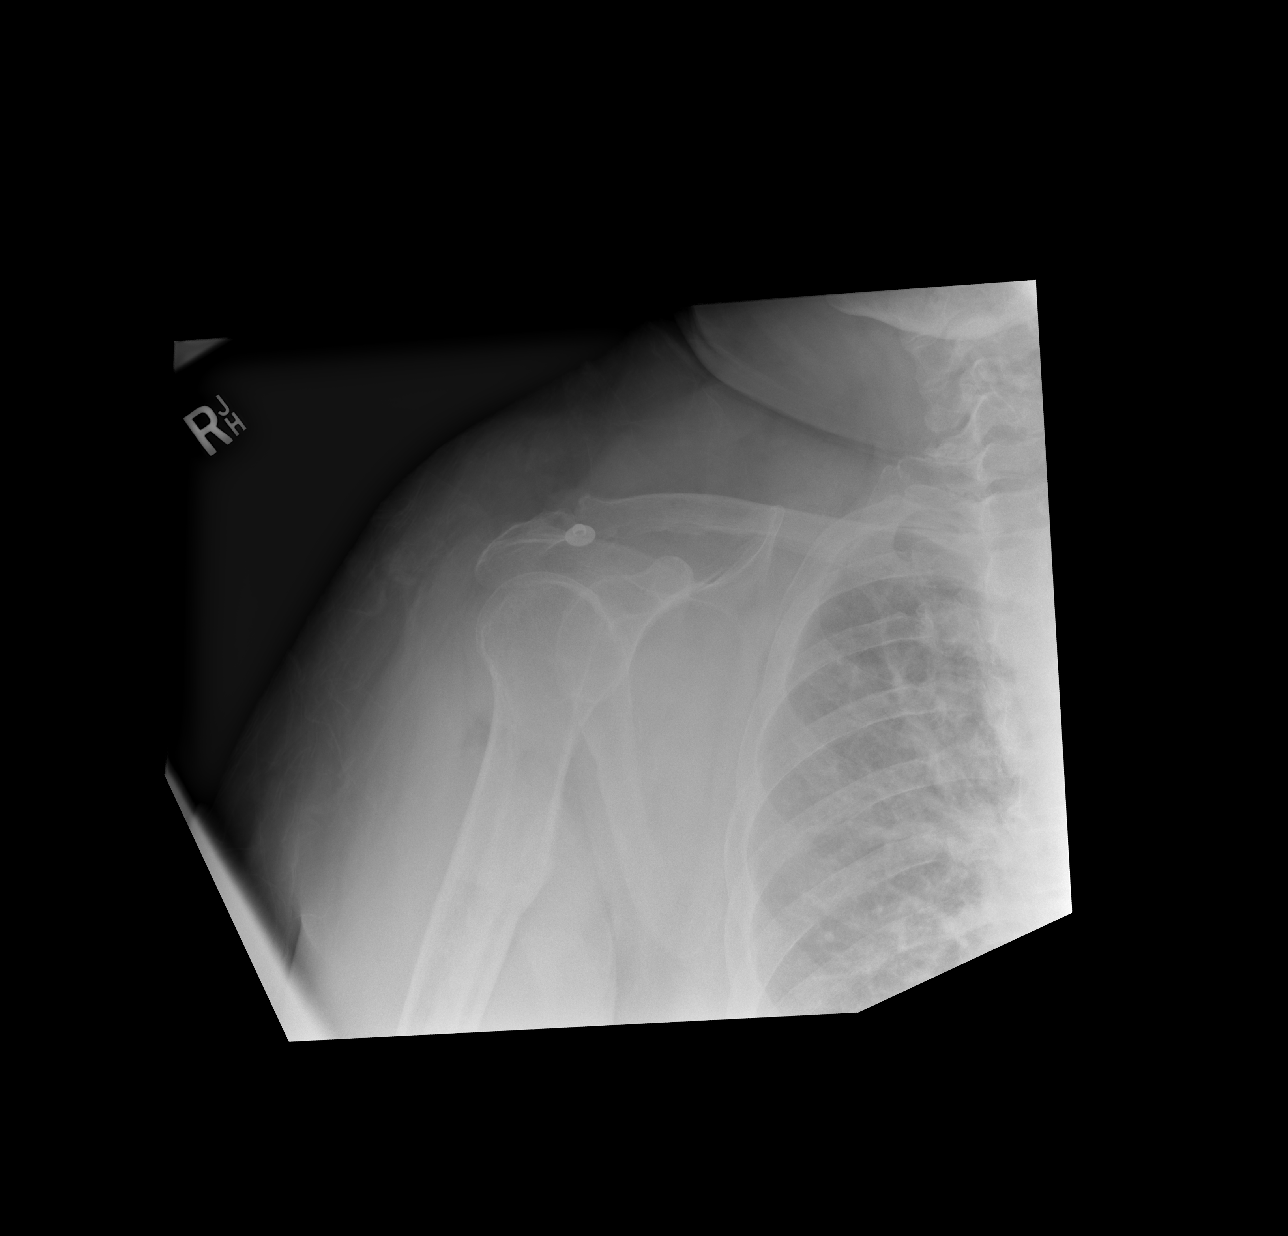

[x shoulder ap right (3 of 3)]
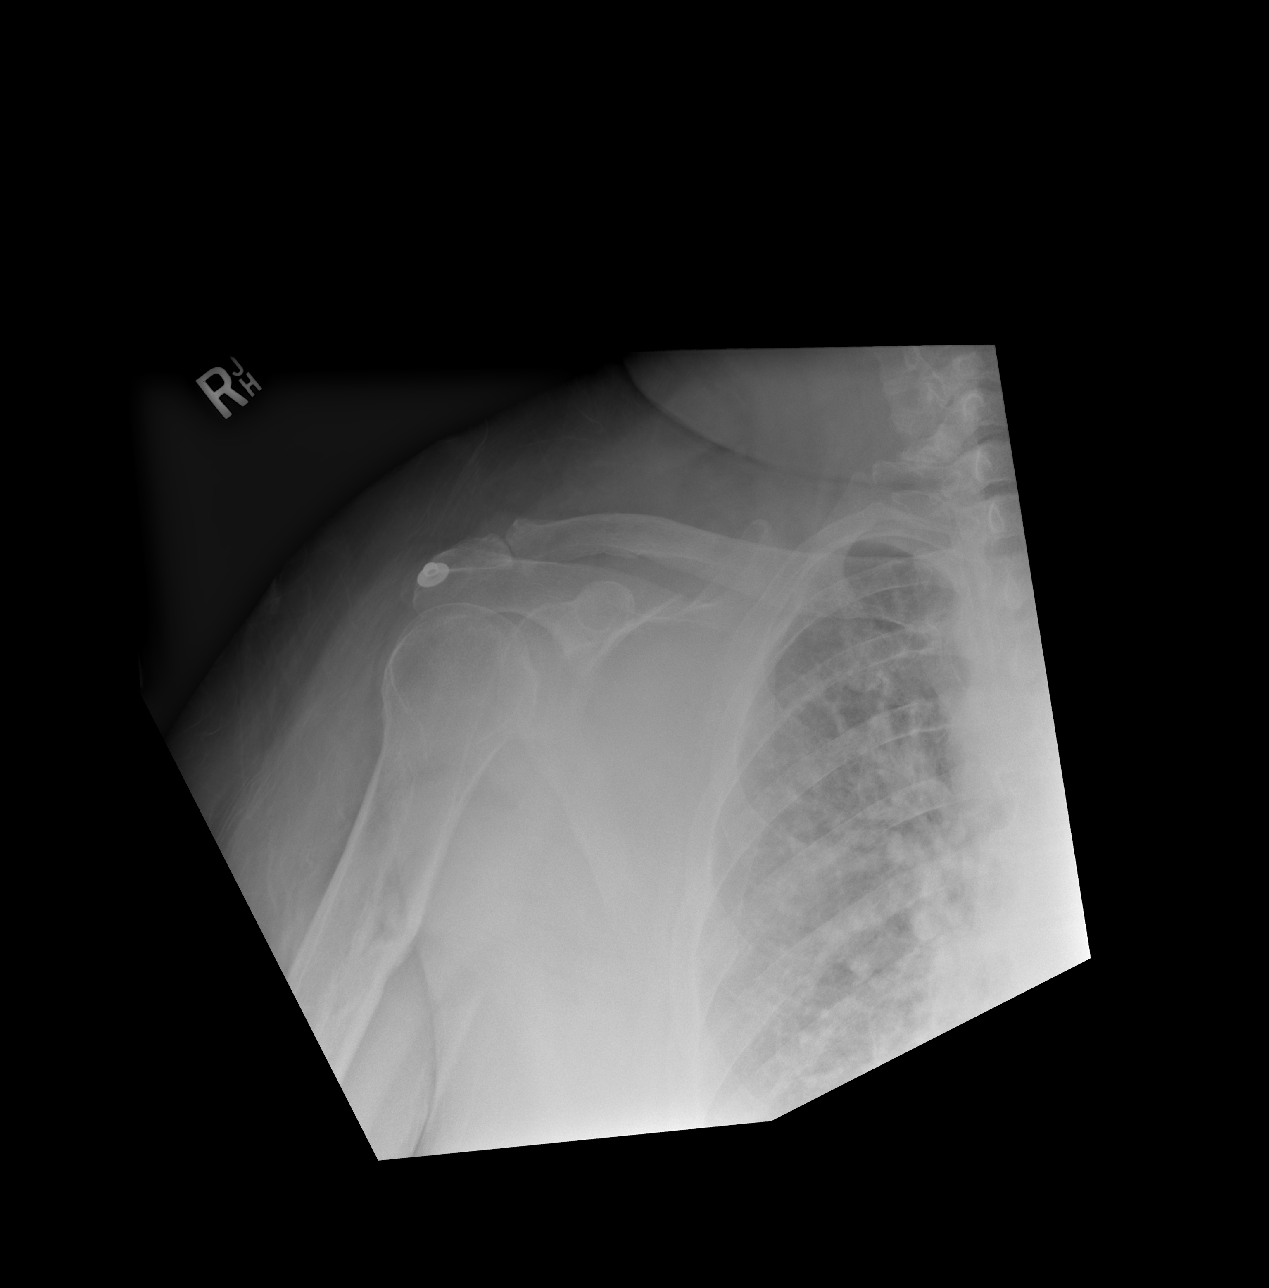

[3 of 3 positions shown; findings below may reference images not displayed]

FINDINGS: The AC and glenohumeral joints are intact without joint dislocation.
Healing fracture of the proximal humeral diaphysis is partially
included on this study. No acute fracture is apparent. The adjacent
ribs and lung and scapula are nonacute.
IMPRESSION: No acute osseous abnormality of the right shoulder. Healing known
diaphyseal fracture of the proximal humerus.

## 2019-11-24 ENCOUNTER — Ambulatory Visit: Payer: BC Managed Care – PPO | Attending: Internal Medicine

## 2019-11-24 DIAGNOSIS — Z23 Encounter for immunization: Secondary | ICD-10-CM | POA: Insufficient documentation

## 2019-11-24 NOTE — Progress Notes (Signed)
   Covid-19 Vaccination Clinic  Name:  NICCI VAUGHAN    MRN: [id number] DOB: 11/28/1955  11/24/2019  Ms. Goldblatt was observed post Covid-19 immunization for 30 minutes based on pre-vaccination screening without incident. She was provided with Vaccine Information Sheet and instruction to access the V-Safe system.   Ms. Ehly was instructed to call 911 with any severe reactions post vaccine: Marland Kitchen Difficulty breathing  . Swelling of face and throat  . A fast heartbeat  . A bad rash all over body  . Dizziness and weakness   Immunizations Administered    Name Date Dose VIS Date Route   Pfizer COVID-19 Vaccine 11/24/2019  8:23 AM 0.3 mL 08/30/2019 Intramuscular   Manufacturer: [place]   Lot: [id number]   [place]: [id number]

## 2019-12-24 ENCOUNTER — Ambulatory Visit: Payer: BC Managed Care – PPO | Attending: Internal Medicine

## 2019-12-24 DIAGNOSIS — Z23 Encounter for immunization: Secondary | ICD-10-CM

## 2019-12-24 NOTE — Progress Notes (Signed)
   Covid-19 Vaccination Clinic  Name:  Hannah Khan    MRN: [id number] DOB: July 06, 1956  12/24/2019  [name] was observed post Covid-19 immunization for 15 minutes without incident. She was provided with Vaccine Information Sheet and instruction to access the V-Safe system.   [name] was instructed to call 911 with any severe reactions post vaccine: Marland Kitchen Difficulty breathing  . Swelling of face and throat  . A fast heartbeat  . A bad rash all over body  . Dizziness and weakness   Immunizations Administered    Name Date Dose VIS Date Route   Pfizer COVID-19 Vaccine 12/24/2019 10:10 AM 0.3 mL 08/30/2019 Intramuscular   Manufacturer: [employer]   Lot: [id number]   [place]: [id number]

## 2019-12-26 ENCOUNTER — Other Ambulatory Visit: Payer: Self-pay | Admitting: Cardiovascular Disease

## 2019-12-27 ENCOUNTER — Ambulatory Visit: Payer: BC Managed Care – PPO | Admitting: Cardiovascular Disease

## 2019-12-27 ENCOUNTER — Other Ambulatory Visit: Payer: Self-pay

## 2019-12-27 ENCOUNTER — Encounter: Payer: Self-pay | Admitting: Cardiovascular Disease

## 2019-12-27 VITALS — BP 120/64 | HR 101 | Ht 67.0 in | Wt 367.2 lb

## 2019-12-27 DIAGNOSIS — I4891 Unspecified atrial fibrillation: Secondary | ICD-10-CM

## 2019-12-27 DIAGNOSIS — I10 Essential (primary) hypertension: Secondary | ICD-10-CM | POA: Diagnosis not present

## 2019-12-27 DIAGNOSIS — G4733 Obstructive sleep apnea (adult) (pediatric): Secondary | ICD-10-CM

## 2019-12-27 DIAGNOSIS — I4821 Permanent atrial fibrillation: Secondary | ICD-10-CM

## 2019-12-27 DIAGNOSIS — I5032 Chronic diastolic (congestive) heart failure: Secondary | ICD-10-CM

## 2019-12-27 DIAGNOSIS — K7469 Other cirrhosis of liver: Secondary | ICD-10-CM

## 2019-12-27 DIAGNOSIS — E785 Hyperlipidemia, unspecified: Secondary | ICD-10-CM

## 2019-12-27 NOTE — Patient Instructions (Addendum)
Medication Instructions:  Your physician recommends that you continue on your current medications as directed. Please refer to the Current Medication list given to you today.  *If you need a refill on your cardiac medications before your next appointment, please call your pharmacy*  Lab Work: NONE   Testing/Procedures: NONE  Follow-Up: At [facility], you and your health needs are our priority.  As part of our continuing mission to provide you with exceptional heart care, we have created designated Provider Care Teams.  These Care Teams include your primary Cardiologist (physician) and Advanced Practice Providers (APPs -  Physician Assistants and Nurse Practitioners) who all work together to provide you with the care you need, when you need it.  We recommend signing up for the patient portal called "MyChart".  Sign up information is provided on this After Visit Summary.  MyChart is used to connect with patients for Virtual Visits (Telemedicine).  Patients are able to view lab/test results, encounter notes, upcoming appointments, etc.  Non-urgent messages can be sent to your provider as well.   To learn more about what you can do with MyChart, go to [email or web address].    Your next appointment:   2 month(s)  The format for your next appointment:   In Person  Provider:   You WILL  one of the following Advanced Practice Providers on your designated Care Team:    Kerin Ransom, PA-C  [place], [place]  [name], [place]  Other Instructions  Please ask your OB/GYN team if they think a medication for overactive bladder may be helpful or appropriate.

## 2019-12-27 NOTE — Progress Notes (Signed)
Cardiology Office Note   Date:  01/13/2020   ID:  CLARY BOULAIS, DOB 1956-07-16, MRN [id number]  PCP:  Lujean Amel, MD  Cardiologist:   Skeet Latch, MD  Dr. Paulita Fujita: GI Dr. Alen Blew: Hematology  No chief complaint on file.   Patient ID:  Hannah Khan is a 64 y.o. female with hypertension, chronic atrial fibrillation, diabetes mellitus type 2, hyperlipidemia, cryptogenic cirrhosis, thrombocytopenia, and morbid obesity who presents for follow-up.  Hannah Khan was admitted to the hospital 11/2015 with atrial fibrillation with rapid ventricular response. Given her history of portal hypertension and cirrhosis she was not anticoagulated. She was started on diltiazem for rate control and continued on her home propranolol. Her rate did not stay consistently less than 100 bpm so she was started on digoxin as well.  Echo revealed normal systolic function and her thyroid function was normal.  She was admitted to the hospital 10/7739 with diastolic heart failure.  She was diuresed with IV lasix and digoxin was discontinued due to hypokalemia.  She was seen in follow up 02/2016, at which time she complained of hypotension.  Her heart rate was well-controlled but she continued to feel poorly in AF.  She was referred to Dr. Rayann Heman for consideration of a Watchman device however, he felt that she would not be a good candidate, as she will only be able to be on antiplatelets for one month and she is unlikely to remain in sinus rhythm without significant weight loss.   Hannah Khan fell and broke her R arm 09/2017.  She was also hospitalized July and August 2019 with cellulitis.  During that hospitalization she had atrial fibrillation with rapid ventricular response.  She was initially treated with a diltiazem drip and then transitioned back to oral diltiazem.  Since hospitalization she has struggled with lower extremity edema. She notes a variable response to her Bumex.  Bumex was increased to 3 mg twice  daily.  However she thought she was supposed be taking it 2 mg 3 times a day and was unable to do so because of schedule issues.  She continued to take it 2 mg twice daily.  She remains in atrial fibrillation and her rate was poorly controlled so digoxin was added to her regimen.  Her heart rate has been mostly in the 80s to 90s.  However, she had a headache on digoxin so it was discontinued.  She got a new CPAP machine and has been using it regularly.    Hannah Khan was admitted 08/2018 with atrial fibrillation with RVR.  Her heart rate was 160 at home and improved to 135 with her medication.  She was given adenosine and then started on a diltiazem drip.  BNP was 266 and troponin was negative.  It was thought that the episode was triggered by dehydration from vomiting after colonoscopy and possibly viral gatroenteritis.  She had constant diarrhea after the procedure.  She was transitioned back to oral diltiazem.  Lately she has struggled with lower extremity edema.  She is only able to take Bumex twice weekly because of her job.  She takes it at work she will be able to make it to the bathroom in time.  She has urinary urgency and some urinary incontinence.  She has had significant lower extremity edema and shortness of breath with exertion.  When she does take it it seems to work well.  She has occasional palpitations.  She denies chest pain, lightheadedness, or dizziness.  She  has completed her coronavirus vaccines.  She notes that breathing with the mask is a struggle for her.     Past Medical History:  Diagnosis Date  . Anemia    hx of  . Arthritis    knees  . Diabetes mellitus without complication ([place])    type 2  . Fibromyalgia   . HTN (hypertension)   . Hx of colonic polyps    s/p partial colectomy  . Hyperlipidemia   . Joint pain    in knees and back spasms  . Left leg swelling    wear compression hose  . Liver cirrhosis secondary to NASH (nonalcoholic steatohepatitis) ([place]) dx nov  2014   had enlarged spleen also   . Low blood pressure    bp varies  . Morbid obesity ([place])   . Persistent atrial fibrillation ([place])   . Pneumonia 10/2016  . PONV (postoperative nausea and vomiting)    in past none recent  . Portal hypertension ([place])   . Sleep apnea    uses cpap setting of 14  . Spleen enlarged   . 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 [facility])     Past Surgical History:  Procedure Laterality Date  . BREAST LUMPECTOMY WITH RADIOACTIVE SEED LOCALIZATION Right 08/29/2017   Procedure: RIGHT BREAST LUMPECTOMY WITH RADIOACTIVE SEEDS LOCALIZATION;  Surgeon: Erroll Luna, MD;  Location: [place];  Service: General;  Laterality: Right;  . CESAREAN SECTION  1989  . CHOLECYSTECTOMY  20 yrs ago  . COLECTOMY  2011  . COLONOSCOPY    . COLONOSCOPY N/A 09/05/2018   Procedure: COLONOSCOPY;  Surgeon: [name], MD;  Location: [facility];  Service: Endoscopy;  Laterality: N/A;  . DILATATION & CURETTAGE/HYSTEROSCOPY WITH MYOSURE N/A 05/06/2016   Procedure: DILATATION & CURETTAGE/HYSTEROSCOPY WITH MYOSURE WITH POLYPECTOMY;  Surgeon: Janyth Pupa, DO;  Location: [facility];  Service: Gynecology;  Laterality: N/A;  . ESOPHAGOGASTRODUODENOSCOPY (EGD) WITH PROPOFOL N/A 09/04/2013   Procedure: ESOPHAGOGASTRODUODENOSCOPY (EGD) WITH PROPOFOL;  Surgeon: [name], MD;  Location: [facility];  Service: Endoscopy;  Laterality: N/A;  . KNEE ARTHROSCOPY  yrs ago   bilateral, one done x 1, one done twice  . POLYPECTOMY  09/05/2018   Procedure: POLYPECTOMY;  Surgeon: [name], MD;  Location: [facility];  Service: Endoscopy;;     Current Outpatient Medications  Medication Sig Dispense Refill  . albuterol (PROVENTIL HFA;VENTOLIN HFA) 108 (90 Base) MCG/ACT inhaler TAKE 2 PUFFS INTO LUNGS EVERY 6 HOURS AS NEEDED    . bumetanide (BUMEX) 2 MG tablet TAKE 1 TABLET BY MOUTH EVERY DAY 90 tablet 3  . calcium carbonate (OS-CAL - DOSED IN MG OF ELEMENTAL CALCIUM) 1250 (500 Ca) MG tablet  Take 1 tablet by mouth daily with breakfast.     . diltiazem (CARDIZEM CD) 300 MG 24 hr capsule TAKE 1 CAPSULE BY MOUTH EVERY DAY 90 capsule 3  . JARDIANCE 25 MG TABS tablet Take 25 mg by mouth daily after breakfast.   5  . KLOR-CON M20 20 MEQ tablet Take 1 tablet (20 mEq total) by mouth daily. 90 tablet 3  . norethindrone (AYGESTIN) 5 MG tablet Take 5 mg by mouth daily.    . propranolol (INDERAL) 10 MG tablet TAKE 1 TABLET BY MOUTH TWICE A DAY 180 tablet 0  . spironolactone (ALDACTONE) 25 MG tablet TAKE 1 TABLET (25 MG TOTAL) BY MOUTH DAILY. SCHEDULE OV FOR FURTHER REFILLS. 90 tablet 0   No current facility-administered medications for this visit.    Allergies:  Celecoxib, Shellfish allergy, Barium-containing compounds, Prednisone, Statins, Fentanyl, Ibuprofen, Tramadol, and Tylenol [acetaminophen]    Social History:  The patient  reports that she quit smoking about 45 years ago. Her smoking use included cigarettes. She has a 3.00 pack-year smoking history. She has never used smokeless tobacco. She reports current alcohol use. She reports that she does not use drugs.   Family History:  [name] family history includes Cancer in her brother, father, and mother; Congestive Heart Failure in her mother; Heart failure in her mother; Hyperlipidemia in her brother and mother; Lung cancer in her father; Stroke in her mother and another family member.    ROS:  Please see the history of present illness.   Otherwise, review of systems are positive for none.   All other systems are reviewed and negative.    PHYSICAL EXAM: VS:  BP 120/64   Pulse (!) 101   Ht 5' 7"  (1.702 m)   Wt (!) 367 lb 3.2 oz (166.6 kg)   SpO2 97%   BMI 57.51 kg/m  , BMI Body mass index is 57.51 kg/m. GENERAL:  Well-appearing.  HEENT: Pupils equal round and reactive, fundi not visualized, oral mucosa unremarkable NECK:  No jugular venous distention, waveform within normal limits, carotid upstroke brisk and symmetric,  no bruits LUNGS:  Clear to auscultation bilaterally HEART:  Irregularly irregular.  PMI not displaced or sustained,S1 and S2 within normal limits, no S3, no S4, no clicks, no rubs, no murmurs ABD:  Flat, positive bowel sounds normal in frequency in pitch, no bruits, no rebound, no guarding, no midline pulsatile mass, no hepatomegaly, no splenomegaly EXT:  2 plus pulses throughout, 3+ LE edema, no cyanosis no clubbing SKIN:  No rashes no nodules NEURO:  Cranial nerves II through XII grossly intact, motor grossly intact throughout PSYCH:  Cognitively intact, oriented to person place and time   EKG:  EKG is ordered today.  06/06/16: Atrial fibrillation. Rate 93 bpm. Low voltage. 11/14/16: Atrial fibrillation. Rate 87 bpm. 02/07/79 atrial fibrillation. Rate 95 bpm. Low voltage precordial leads. Incomplete right bundle branch block. 06/06/17: Atrial fibrillation.  Rate 76 bpm.   06/22/2018: Atrial fibrillation.  Rate 84 bpm.  Incomplete right bundle branch block.  Low voltage.  LAFB. 11/16/18: Atrial fibrillation.  Rate 73 bpm.  Low voltage.  LAFB  Incomplete RBBB /9/21: Atrial fibrillation.  Rate 101 bpm.  Low voltage.  LAD.  Echo 12/10/15: Study Conclusions  - Procedure narrative: Transthoracic echocardiography. The study  was technically difficult, as a result of body habitus.  Intravenous contrast (Definity) was administered. - Left ventricle: The cavity size was normal. Wall thickness was  increased in a pattern of mild LVH. Systolic function was normal.  The estimated ejection fraction was in the range of 55% to 60%.  Wall motion was normal; there were no regional wall motion  abnormalities. The study is not technically sufficient to allow  evaluation of LV diastolic function. - Mitral valve: Mildly thickened leaflets . There was trivial  regurgitation. - Left atrium: The atrium was mildly dilated. - Inferior vena cava: The vessel was dilated. The respirophasic  diameter changes  were blunted (< 50%), consistent with elevated  central venous pressure. - Pericardium, extracardiac: A trivial pericardial effusion was  identified posterior to the heart.  Impressions:  - Technically difficult study. The patient was in atrial flutter  during the study. The LVEF is 55-60%, mild LVH, grossly normal  wall motion, trivial MR, mild LAE, dilated IVC, trivial posterior  pericardial effusion.  Recent Labs: 01/17/2019: BUN 16; Creatinine, Ser 0.84; Potassium 3.8; Sodium 142 01/07/2020: Hemoglobin 15.8; Platelet Count 49    Lipid Panel    Component Value Date/Time   CHOL 175 07/02/2014 1138   TRIG 93 07/02/2014 1138   HDL 59 07/02/2014 1138   CHOLHDL 3.0 07/02/2014 1138   VLDL 19 07/02/2014 1138   LDLCALC 97 07/02/2014 1138      Wt Readings from Last 3 Encounters:  01/07/20 (!) 373 lb 11.2 oz (169.5 kg)  12/27/19 (!) 367 lb 3.2 oz (166.6 kg)  06/07/19 (!) 367 lb 8 oz (166.7 kg)     ASSESSMENT AND PLAN:  # Persistent atrial fibrillation: Rate has been controlled on diltiazem and propranolol.  Her heart rate was initially a little bit higher but improved after sitting and resting.  She struggles with volume overload because she cannot take he diuretic at work.  This makes it more challenging to control her rate.  Continue diltiazem and propranolol.  She was admitted with afib with RVR in the setting of intravascular volume depletion.  HR better controlled on digoxin but it was stopped 2/2 headaches.  She is not on anticoagulation due to high risk of GI bleed and cirrhosis.  This patients CHA2DS2-VASc Score and unadjusted Ischemic Stroke Rate (% per year) is equal to 3.2 % stroke rate/year from a score of 3 Above score calculated as 1 point each if present [CHF, HTN, DM, Vascular=MI/PAD/Aortic Plaque, Age if 65-74, or Female] Above score calculated as 2 points each if present [Age > 75, or Stroke/TIA/TE]  # Chronic diastolic heart failure: She has increased LE  edema in the setting of not taking her diuretics.  She also struggles with urinary urgency.  I encouraged her to talk with her OB about treatment for overactive bladder and whether this might help her to better manage her diuretic regimen.  # OSA: Continue CPAP.     Current medicines are reviewed at length with the patient today.  The patient does not have concerns regarding medicines.   The following changes have been made:  none  Labs/ tests ordered today include:   No orders of the defined types were placed in this encounter.     Disposition:   FU with Arun Herrod C. Oval Linsey, MD, [facility] in 2 months.    Signed, Laurann Mcmorris C. Oval Linsey, MD, [facility]  01/13/2020 2:14 PM    [facility]

## 2020-01-07 ENCOUNTER — Inpatient Hospital Stay: Payer: BC Managed Care – PPO

## 2020-01-07 ENCOUNTER — Inpatient Hospital Stay: Payer: BC Managed Care – PPO | Attending: Oncology | Admitting: Oncology

## 2020-01-07 ENCOUNTER — Other Ambulatory Visit: Payer: Self-pay | Admitting: Gastroenterology

## 2020-01-07 ENCOUNTER — Other Ambulatory Visit: Payer: Self-pay

## 2020-01-07 ENCOUNTER — Telehealth: Payer: Self-pay | Admitting: Oncology

## 2020-01-07 VITALS — BP 140/83 | HR 109 | Temp 98.5°F | Resp 18 | Ht 67.0 in | Wt 373.7 lb

## 2020-01-07 DIAGNOSIS — D6959 Other secondary thrombocytopenia: Secondary | ICD-10-CM | POA: Diagnosis present

## 2020-01-07 DIAGNOSIS — Z79899 Other long term (current) drug therapy: Secondary | ICD-10-CM | POA: Insufficient documentation

## 2020-01-07 DIAGNOSIS — K7460 Unspecified cirrhosis of liver: Secondary | ICD-10-CM

## 2020-01-07 DIAGNOSIS — D696 Thrombocytopenia, unspecified: Secondary | ICD-10-CM

## 2020-01-07 LAB — CBC WITH DIFFERENTIAL (CANCER CENTER ONLY)
Abs Immature Granulocytes: 0.02 10*3/uL (ref 0.00–0.07)
Basophils Absolute: 0.1 10*3/uL (ref 0.0–0.1)
Basophils Relative: 1 %
Eosinophils Absolute: 0.1 10*3/uL (ref 0.0–0.5)
Eosinophils Relative: 2 %
HCT: 46.5 % — ABNORMAL HIGH (ref 36.0–46.0)
Hemoglobin: 15.8 g/dL — ABNORMAL HIGH (ref 12.0–15.0)
Immature Granulocytes: 0 %
Lymphocytes Relative: 20 %
Lymphs Abs: 1 10*3/uL (ref 0.7–4.0)
MCH: 31.3 pg (ref 26.0–34.0)
MCHC: 34 g/dL (ref 30.0–36.0)
MCV: 92.3 fL (ref 80.0–100.0)
Monocytes Absolute: 0.5 10*3/uL (ref 0.1–1.0)
Monocytes Relative: 9 %
Neutro Abs: 3.4 10*3/uL (ref 1.7–7.7)
Neutrophils Relative %: 68 %
Platelet Count: 49 10*3/uL — ABNORMAL LOW (ref 150–400)
RBC: 5.04 MIL/uL (ref 3.87–5.11)
RDW: 14.8 % (ref 11.5–15.5)
WBC Count: 5 10*3/uL (ref 4.0–10.5)
nRBC: 0 % (ref 0.0–0.2)

## 2020-01-07 NOTE — Progress Notes (Signed)
Hematology and Oncology Follow Up Visit  KEONTA MONCEAUX [id number] 07/30/1956 64 y.o. 01/07/2020 10:21 AM  CC: [name], MD [name], M.D. Leighton Ruff. Redmond Pulling, MD Bo Merino, M.D.    Principle Diagnosis: 64 year old woman with thrombocytopenia related to splenic sequestration and cirrhosis of the liver diagnosed 2010.    Prior therapy: Nplate given in 7322 in preparation for surgical intervention.  Current therapy: Active surveillance  Interim History: Mrs. Braziel is here for return evaluation.  Since the last visit, she reports no major changes in her health.  She continues to have issues with her left arm causing her disability and inability to function properly.  She is ambulating with the help of walker without any recent falls or syncope.  She denies any recent bruising but did report a bleeding pimple overnight.  She did also have vaginal bleeding that was evaluated by her gynecologist denies any hemoptysis or hematemesis.  She denies any hematochezia or melena.       Medications: Reviewed without changes. Current Outpatient Medications  Medication Sig Dispense Refill  . albuterol (PROVENTIL HFA;VENTOLIN HFA) 108 (90 Base) MCG/ACT inhaler TAKE 2 PUFFS INTO LUNGS EVERY 6 HOURS AS NEEDED    . bumetanide (BUMEX) 2 MG tablet TAKE 1 TABLET BY MOUTH EVERY DAY 90 tablet 3  . calcium carbonate (OS-CAL - DOSED IN MG OF ELEMENTAL CALCIUM) 1250 (500 Ca) MG tablet Take 1 tablet by mouth daily with breakfast.     . diltiazem (CARDIZEM CD) 300 MG 24 hr capsule TAKE 1 CAPSULE BY MOUTH EVERY DAY 90 capsule 3  . JARDIANCE 25 MG TABS tablet Take 25 mg by mouth daily after breakfast.   5  . KLOR-CON M20 20 MEQ tablet Take 1 tablet (20 mEq total) by mouth daily. 90 tablet 3  . norethindrone (AYGESTIN) 5 MG tablet Take 5 mg by mouth daily.    . propranolol (INDERAL) 10 MG tablet TAKE 1 TABLET BY MOUTH TWICE A DAY 180 tablet 0  . spironolactone (ALDACTONE) 25 MG tablet TAKE 1  TABLET (25 MG TOTAL) BY MOUTH DAILY. SCHEDULE OV FOR FURTHER REFILLS. 90 tablet 0   No current facility-administered medications for this visit.    Allergies:  Allergies  Allergen Reactions  . Celecoxib Other (See Comments)    "speech slurred" with celebrex  . Shellfish Allergy Swelling and Rash    TINGLING AND SWELLING OF LIPS. LARGE WHELPS QUARTER-SIZE  . Barium-Containing Compounds Other (See Comments)    TACHYCARDIA  . Prednisone Other (See Comments)    TACHYCARDIA  . Statins Other (See Comments)    AGGRAVATED FIBROMYALGIA  . Fentanyl     Hallucinations   . Ibuprofen Other (See Comments)    UNSPECIFIED SPECIFIC REACTION >> "DUE TO PLATELETS"  . Tramadol     Hallucination   . Tylenol [Acetaminophen] Other (See Comments)    UNSPECIFIED SPECIFIC REACTION >> "DUE TO PLATELETS"       Physical Exam:  Blood pressure 140/83, pulse (!) 109, temperature 98.5 F (36.9 C), temperature source Temporal, resp. rate 18, height 5' 7"  (1.702 m), weight (!) 373 lb 11.2 oz (169.5 kg), SpO2 95 %.     ECOG: 1    General appearance: Alert, awake without any distress. Head: Atraumatic without abnormalities Oropharynx: Without any thrush or ulcers. Eyes: No scleral icterus. Lymph nodes: No lymphadenopathy noted in the cervical, supraclavicular, or axillary nodes Heart:regular rate and rhythm, without any murmurs or gallops.   Lung: Clear to auscultation without any  rhonchi, wheezes or dullness to percussion. Abdomin: Soft, nontender without any shifting dullness or ascites. Musculoskeletal: No clubbing or cyanosis. Neurological: No motor or sensory deficits. Skin: No rashes or lesions.        Lab Results: Lab Results  Component Value Date   WBC 4.2 06/07/2019   HGB 14.2 06/07/2019   HCT 42.9 06/07/2019   MCV 93.9 06/07/2019   PLT 33 (L) 06/07/2019     Chemistry      Component Value Date/Time   NA 142 01/17/2019 0821   NA 142 08/01/2014 1509   K 3.8 01/17/2019  0821   K 4.1 08/01/2014 1509   CL 103 01/17/2019 0821   CL 107 08/03/2012 1449   CO2 24 01/17/2019 0821   CO2 23 08/01/2014 1509   BUN 16 01/17/2019 0821   BUN 16.9 08/01/2014 1509   CREATININE 0.84 01/17/2019 0821   CREATININE 0.77 06/28/2016 1157   CREATININE 0.7 08/01/2014 1509      Component Value Date/Time   CALCIUM 8.9 01/17/2019 0821   CALCIUM 9.3 08/01/2014 1509   ALKPHOS 77 09/15/2018 2131   ALKPHOS 82 08/01/2014 1509   AST 42 (H) 09/15/2018 2131   AST 54 (H) 08/01/2014 1509   ALT 24 09/15/2018 2131   ALT 30 08/01/2014 1509   BILITOT 2.7 (H) 09/15/2018 2131   BILITOT 1.71 (H) 08/01/2014 6026       64 year old woman with:  1.  Thrombocytopenia diagnosed in 2010.  She was found to have cirrhosis of the liver and splenic sequestration.     She continues to be on active surveillance without any need for intervention at this time.  The natural course of her disease and risk of bleeding was assessed at this time.  Treatment options including platelet transfusion as well as Nplate was reiterated.   Laboratory data reviewed from today showed a platelet count of 49 without any need for intervention.  I recommended continued active surveillance.  2.  Humerus fracture: Continues to have issues with pain and discomfort and participating physical therapy.  Follow-up with orthopedics.  3.  Cirrhosis of the liver: No issues reported since the last visit.  No decompensation noted.  4.  Vaginal bleeding: Follows up with gynecology regarding this issue.  Platelet count is adequate and not contributing to the exacerbation of bleeding.  5  Follow-up: In 6 months for follow-up.  30  minutes were dedicated to this visit. The time was spent on reviewing laboratory data,  discussing treatment options, and answering questions regarding future plan.       Zola Button 4/20/202110:21 AM

## 2020-01-07 NOTE — Telephone Encounter (Signed)
Scheduled per los. Gave avs and calendar  

## 2020-01-10 ENCOUNTER — Ambulatory Visit
Admission: RE | Admit: 2020-01-10 | Discharge: 2020-01-10 | Disposition: A | Discharge status: Home / Self Care | Payer: BC Managed Care – PPO | Source: Ambulatory Visit | Attending: Gastroenterology | Admitting: Gastroenterology

## 2020-01-10 DIAGNOSIS — K7460 Unspecified cirrhosis of liver: Secondary | ICD-10-CM

## 2020-01-20 ENCOUNTER — Other Ambulatory Visit: Payer: Self-pay | Admitting: Nurse Practitioner

## 2020-01-20 DIAGNOSIS — N950 Postmenopausal bleeding: Secondary | ICD-10-CM

## 2020-01-28 ENCOUNTER — Other Ambulatory Visit: Payer: Self-pay | Admitting: Cardiovascular Disease

## 2020-01-29 ENCOUNTER — Emergency Department (HOSPITAL_COMMUNITY)
Admission: EM | Admit: 2020-01-29 | Discharge: 2020-01-29 | Disposition: A | Discharge status: Home / Self Care | Payer: BC Managed Care – PPO | Attending: Emergency Medicine | Admitting: Emergency Medicine

## 2020-01-29 ENCOUNTER — Other Ambulatory Visit: Payer: Self-pay

## 2020-01-29 ENCOUNTER — Other Ambulatory Visit: Payer: BC Managed Care – PPO

## 2020-01-29 DIAGNOSIS — Z5321 Procedure and treatment not carried out due to patient leaving prior to being seen by health care provider: Secondary | ICD-10-CM | POA: Insufficient documentation

## 2020-01-29 DIAGNOSIS — N939 Abnormal uterine and vaginal bleeding, unspecified: Secondary | ICD-10-CM | POA: Insufficient documentation

## 2020-01-29 LAB — URINALYSIS, ROUTINE W REFLEX MICROSCOPIC
Bacteria, UA: NONE SEEN
Bilirubin Urine: NEGATIVE
Glucose, UA: >=500 mg/dL — AB
Ketones, ur: NEGATIVE mg/dL
Leukocytes,Ua: NEGATIVE
Nitrite: NEGATIVE
Protein, ur: 100 mg/dL — AB
RBC / HPF: >50 RBC/hpf — ABNORMAL HIGH (ref 0–5)
Specific Gravity, Urine: 1.03 (ref 1.005–1.030)
pH: 6 (ref 5.0–8.0)

## 2020-01-29 LAB — CBC
HCT: 47.1 % — ABNORMAL HIGH (ref 36.0–46.0)
Hemoglobin: 15.5 g/dL — ABNORMAL HIGH (ref 12.0–15.0)
MCH: 31.3 pg (ref 26.0–34.0)
MCHC: 32.9 g/dL (ref 30.0–36.0)
MCV: 95.2 fL (ref 80.0–100.0)
Platelets: 53 10*3/uL — ABNORMAL LOW (ref 150–400)
RBC: 4.95 MIL/uL (ref 3.87–5.11)
RDW: 14.6 % (ref 11.5–15.5)
WBC: 4 10*3/uL (ref 4.0–10.5)
nRBC: 0 % (ref 0.0–0.2)

## 2020-01-29 LAB — BASIC METABOLIC PANEL
Anion gap: 9 (ref 5–15)
BUN: 11 mg/dL (ref 8–23)
CO2: 21 mmol/L — ABNORMAL LOW (ref 22–32)
Calcium: 9 mg/dL (ref 8.9–10.3)
Chloride: 109 mmol/L (ref 98–111)
Creatinine, Ser: 0.71 mg/dL (ref 0.44–1.00)
GFR calc Af Amer: >60 mL/min (ref >60)
GFR calc non Af Amer: >60 mL/min (ref >60)
Glucose, Bld: 144 mg/dL — ABNORMAL HIGH (ref 70–99)
Potassium: 4.1 mmol/L (ref 3.5–5.1)
Sodium: 139 mmol/L (ref 135–145)

## 2020-01-29 NOTE — ED Triage Notes (Signed)
Pt arrives pov with reports of vaginal bleeding onset several weeks ago. Has seen her gyn for same. Pt states they have tried several different hormones. Pt called gyn with complaints and was told to come here for eval. Pt also reports dysuria.

## 2020-01-29 NOTE — ED Notes (Signed)
Pt wants to leave. Charge notified. Patient encouraged to stay and see a provider. Refused to stay. Would follow up with an appointment tomorrow. Assisted outside.

## 2020-01-31 ENCOUNTER — Ambulatory Visit
Admission: RE | Admit: 2020-01-31 | Discharge: 2020-01-31 | Disposition: A | Discharge status: Home / Self Care | Payer: BC Managed Care – PPO | Source: Ambulatory Visit | Attending: Nurse Practitioner | Admitting: Nurse Practitioner

## 2020-01-31 DIAGNOSIS — N950 Postmenopausal bleeding: Secondary | ICD-10-CM

## 2020-02-26 ENCOUNTER — Ambulatory Visit: Payer: BC Managed Care – PPO | Admitting: Cardiology

## 2020-02-26 ENCOUNTER — Other Ambulatory Visit: Payer: Self-pay

## 2020-02-26 VITALS — BP 128/86 | HR 90 | Temp 97.8°F | Ht 67.0 in | Wt 367.2 lb

## 2020-02-26 DIAGNOSIS — G4733 Obstructive sleep apnea (adult) (pediatric): Secondary | ICD-10-CM

## 2020-02-26 DIAGNOSIS — I5032 Chronic diastolic (congestive) heart failure: Secondary | ICD-10-CM | POA: Diagnosis not present

## 2020-02-26 DIAGNOSIS — I4821 Permanent atrial fibrillation: Secondary | ICD-10-CM

## 2020-02-26 DIAGNOSIS — E1169 Type 2 diabetes mellitus with other specified complication: Secondary | ICD-10-CM

## 2020-02-26 DIAGNOSIS — I10 Essential (primary) hypertension: Secondary | ICD-10-CM

## 2020-02-26 DIAGNOSIS — K7460 Unspecified cirrhosis of liver: Secondary | ICD-10-CM

## 2020-02-26 DIAGNOSIS — I4819 Other persistent atrial fibrillation: Secondary | ICD-10-CM

## 2020-02-26 DIAGNOSIS — K7581 Nonalcoholic steatohepatitis (NASH): Secondary | ICD-10-CM

## 2020-02-26 MED ORDER — BUMETANIDE 2 MG PO TABS: 2.0000 mg | ORAL_TABLET | Freq: Two times a day (BID) | ORAL | 3 refills | Status: DC

## 2020-02-26 NOTE — Patient Instructions (Signed)
Medication Instructions:   INCREASE Bumex to 2 mg twice daily. If you lose 10 lbs, decrease to 2 mg once daily.  *If you need a refill on your cardiac medications before your next appointment, please call your pharmacy*   Lab Work: Your physician recommends that you return for lab work in: 2 weeks. You do not need an appointment to have blood work done in our office. Please bring your lab slips with you.  If you have labs (blood work) drawn today and your tests are completely normal, you will receive your results only by: Marland Kitchen MyChart Message (if you have MyChart) OR . A paper copy in the mail If you have any lab test that is abnormal or we need to change your treatment, we will call you to review the results.  Follow-Up: At [facility], you and your health needs are our priority.  As part of our continuing mission to provide you with exceptional heart care, we have created designated Provider Care Teams.  These Care Teams include your primary Cardiologist (physician) and Advanced Practice Providers (APPs -  Physician Assistants and Nurse Practitioners) who all work together to provide you with the care you need, when you need it.  We recommend signing up for the patient portal called "MyChart".  Sign up information is provided on this After Visit Summary.  MyChart is used to connect with patients for Virtual Visits (Telemedicine).  Patients are able to view lab/test results, encounter notes, upcoming appointments, etc.  Non-urgent messages can be sent to your provider as well.   To learn more about what you can do with MyChart, go to [email or web address].    Your next appointment:   2-3 month(s)  The format for your next appointment:   In Person  Provider:   Kerin Ransom, PA-C

## 2020-02-26 NOTE — Assessment & Plan Note (Signed)
Reports compliance with C-pap 

## 2020-02-26 NOTE — Assessment & Plan Note (Signed)
Associated thrombocytopenia-plts [id number]

## 2020-02-26 NOTE — Progress Notes (Signed)
Cardiology Office Note:    Date:  02/26/2020   ID:  Hannah Khan, DOB December 26, 1955, MRN [id number]  PCP:  Lujean Amel, MD  Cardiologist:  Skeet Latch, MD  Electrophysiologist:  None   Referring MD: Lujean Amel, MD   CC:  LE edema  History of Present Illness:    Hannah Khan is a pleasant 64 y.o. female with a hx of morbid obesity with a BMI of 57, cirrhosis with thrombocytopenia, chronic atrial fibrillation not on anticoagulation because of bleeding risk, non-insulin-dependent diabetes, and chronic diastolic heart failure.  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  She she has trouble with diuretic compliance because of her work.  She now is on break and has been taking her diuretic.  She saw Dr. Oval Linsey in April.  Since then her weight is come down about 7 pounds.  Her lower extremity edema is a little bit better.  She is also had other problems, she is seeing a urologist for urinary incontinence, and is being followed by her OB/GYN for vaginal bleeding.  Past Medical History:  Diagnosis Date  . Anemia    hx of  . Arthritis    knees  . Diabetes mellitus without complication ([place])    type 2  . Fibromyalgia   . HTN (hypertension)   . Hx of colonic polyps    s/p partial colectomy  . Hyperlipidemia   . Joint pain    in knees and back spasms  . Left leg swelling    wear compression hose  . Liver cirrhosis secondary to NASH (nonalcoholic steatohepatitis) ([place]) dx nov 2014   had enlarged spleen also   . Low blood pressure    bp varies  . Morbid obesity ([place])   . Persistent atrial fibrillation ([place])   . Pneumonia 10/2016  . PONV (postoperative nausea and vomiting)    in past none recent  . Portal hypertension ([place])   . Sleep apnea    uses cpap setting of 14  . Spleen enlarged   . 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 [facility])     Past Surgical History:  Procedure Laterality Date  . BREAST LUMPECTOMY WITH RADIOACTIVE SEED LOCALIZATION Right 08/29/2017    Procedure: RIGHT BREAST LUMPECTOMY WITH RADIOACTIVE SEEDS LOCALIZATION;  Surgeon: Erroll Luna, MD;  Location: [facility];  Service: General;  Laterality: Right;  . CESAREAN SECTION  1989  . CHOLECYSTECTOMY  20 yrs ago  . COLECTOMY  2011  . COLONOSCOPY    . COLONOSCOPY N/A 09/05/2018   Procedure: COLONOSCOPY;  Surgeon: [name], MD;  Location: [facility];  Service: Endoscopy;  Laterality: N/A;  . DILATATION & CURETTAGE/HYSTEROSCOPY WITH MYOSURE N/A 05/06/2016   Procedure: DILATATION & CURETTAGE/HYSTEROSCOPY WITH MYOSURE WITH POLYPECTOMY;  Surgeon: Janyth Pupa, DO;  Location: [facility];  Service: Gynecology;  Laterality: N/A;  . ESOPHAGOGASTRODUODENOSCOPY (EGD) WITH PROPOFOL N/A 09/04/2013   Procedure: ESOPHAGOGASTRODUODENOSCOPY (EGD) WITH PROPOFOL;  Surgeon: [name], MD;  Location: [facility];  Service: Endoscopy;  Laterality: N/A;  . KNEE ARTHROSCOPY  yrs ago   bilateral, one done x 1, one done twice  . POLYPECTOMY  09/05/2018   Procedure: POLYPECTOMY;  Surgeon: [name], MD;  Location: [facility];  Service: Endoscopy;;    Current Medications: Current Meds  Medication Sig  . albuterol (PROVENTIL HFA;VENTOLIN HFA) 108 (90 Base) MCG/ACT inhaler TAKE 2 PUFFS INTO LUNGS EVERY 6 HOURS AS NEEDED  . bumetanide (BUMEX) 2 MG tablet Take 1 tablet (2 mg total) by mouth 2 (  two) times daily.  . calcium carbonate (OS-CAL - DOSED IN MG OF ELEMENTAL CALCIUM) 1250 (500 Ca) MG tablet Take 1 tablet by mouth daily with breakfast.   . diltiazem (CARDIZEM CD) 300 MG 24 hr capsule TAKE 1 CAPSULE BY MOUTH EVERY DAY  . JARDIANCE 25 MG TABS tablet Take 25 mg by mouth daily after breakfast.   . KLOR-CON M20 20 MEQ tablet Take 1 tablet (20 mEq total) by mouth daily.  . megestrol (MEGACE) 20 MG tablet Take 20 mg by mouth daily.  . propranolol (INDERAL) 10 MG tablet TAKE 1 TABLET BY MOUTH TWICE A DAY  . spironolactone (ALDACTONE) 25 MG tablet TAKE 1 TABLET (25 MG TOTAL) BY MOUTH DAILY.  SCHEDULE OV FOR FURTHER REFILLS.  . [DISCONTINUED] bumetanide (BUMEX) 2 MG tablet TAKE 1 TABLET BY MOUTH EVERY DAY     Allergies:   Celecoxib, Shellfish allergy, Barium-containing compounds, Prednisone, Statins, Fentanyl, Ibuprofen, Tramadol, and Tylenol [acetaminophen]   Social History   Socioeconomic History  . Marital status: Married    Spouse name: Not on file  . Number of children: Not on file  . Years of education: Not on file  . Highest education level: Not on file  Occupational History  . Occupation: Merchant navy officer  Tobacco Use  . Smoking status: Former Smoker    Packs/day: 1.00    Years: 3.00    Pack years: 3.00    Types: Cigarettes    Quit date: 09/19/1974    Years since quitting: 45.4  . Smokeless tobacco: Never Used  Substance and Sexual Activity  . Alcohol use: Yes    Comment: occ  . Drug use: No  . Sexual activity: Not on file  Other Topics Concern  . Not on file  Social History Narrative   Lives in [place] with her husband.  Instructor for early childhood development.     Social Determinants of Health   Financial Resource Strain:   . Difficulty of Paying Living Expenses:   Food Insecurity:   . Worried About Charity fundraiser in the Last Year:   . Arboriculturist in the Last Year:   Transportation Needs:   . Film/video editor (Medical):   Marland Kitchen Lack of Transportation (Non-Medical):   Physical Activity:   . Days of Exercise per Week:   . Minutes of Exercise per Session:   Stress:   . Feeling of Stress :   Social Connections:   . Frequency of Communication with Friends and Family:   . Frequency of Social Gatherings with Friends and Family:   . Attends Religious Services:   . Active Member of Clubs or Organizations:   . Attends Archivist Meetings:   Marland Kitchen Marital Status:      Family History: [name] family history includes Cancer in her brother, father, and mother; Congestive Heart Failure in her mother; Heart failure in her  mother; Hyperlipidemia in her brother and mother; Lung cancer in her father; Stroke in her mother and another family member.  ROS:   Please see the history of present illness.     All other systems reviewed and are negative.  EKGs/Labs/Other Studies Reviewed:    The following studies were reviewed today: Echo 03/16/2020- Study Conclusions   - Left ventricle: The cavity size was normal. There was mild  concentric hypertrophy. Systolic function was normal. The  estimated ejection fraction was in the range of 55% to 60%.  Although no diagnostic regional wall motion abnormality  was  identified, this possibility cannot be completely excluded on the  basis of this study. Due to tachycardia, there was fusion of  early and atrial contributions to ventricular filling. The study  is not technically sufficient to allow evaluation of LV diastolic  function.  - Right ventricle: Not able to assess RV function due to poor  visualization. The cavity size was dilated.  - Right atrium: The atrium was mildly dilated.   Impressions:   - Apical images were poor quality even with Definity imaging,   EKG:  EKG is ordered today.  The ekg ordered today demonstrates AF with CVR-90  Recent Labs: 01/29/2020: BUN 11; Creatinine, Ser 0.71; Hemoglobin 15.5; Platelets 53; Potassium 4.1; Sodium 139  Recent Lipid Panel    Component Value Date/Time   CHOL 175 07/02/2014 1138   TRIG 93 07/02/2014 1138   HDL 59 07/02/2014 1138   CHOLHDL 3.0 07/02/2014 1138   VLDL 19 07/02/2014 1138   LDLCALC 97 07/02/2014 1138    Physical Exam:    VS:  BP 128/86   Pulse 90   Temp 97.8 F (36.6 C)   Ht 5' 7"  (1.702 m)   Wt (!) 367 lb 3.2 oz (166.6 kg)   SpO2 97%   BMI 57.51 kg/m     Wt Readings from Last 3 Encounters:  02/26/20 (!) 367 lb 3.2 oz (166.6 kg)  01/07/20 (!) 373 lb 11.2 oz (169.5 kg)  12/27/19 (!) 367 lb 3.2 oz (166.6 kg)     GEN: Morbidly obese female, in no acute distress HEENT:  Normal NECK: No JVD; No carotid bruits LYMPHATICS: No lymphadenopathy CARDIAC: irregularly irregular no murmurs, rubs, gallops RESPIRATORY:  Clear to auscultation without rales, wheezing or rhonchi  ABDOMEN: Soft, non-tender, non-distended MUSCULOSKELETAL:  1+ bilateral LE edema with chronic skin changes  SKIN: Warm and dry NEUROLOGIC:  Alert and oriented x 3 PSYCHIATRIC:  Normal affect   ASSESSMENT:    Chronic diastolic CHF (congestive heart failure) (HCC) Pt's weight has come down some.  Since she is off work she has been more complaint with diuretic Rx.  She does have urinary incontinence which makes diuresis difficult for her.   Persistent atrial fibrillation (HCC) In AF last few office visits- no AC secondary to bleeding risk  Morbid obesity- BMI 57  Liver cirrhosis secondary to NASH (nonalcoholic steatohepatitis) ([place]) Associated thrombocytopenia-plts 53k  Type 2 diabetes mellitus with other specified complication (HCC) On oral agents  Obstructive sleep apnea Reports compliance with C-pap  PLAN:    Last may she was 348 lbs- I suggested she increase her Bumex to 2 mg BID until she looses another 10 lbs- then go back to 2 mg daily. F/U BMP in two weeks.  F/U with me in 2 months.  From a cardiac standpoint she appears stable enough for any minor OP procedures if they need to be done.    Medication Adjustments/Labs and Tests Ordered: Current medicines are reviewed at length with the patient today.  Concerns regarding medicines are outlined above.  Orders Placed This Encounter  Procedures  . Basic metabolic panel  . EKG 12-Lead   Meds ordered this encounter  Medications  . bumetanide (BUMEX) 2 MG tablet    Sig: Take 1 tablet (2 mg total) by mouth 2 (two) times daily.    Dispense:  60 tablet    Refill:  3    Patient Instructions  Medication Instructions:   INCREASE Bumex to 2 mg twice daily. If you lose  10 lbs, decrease to 2 mg once daily.  *If you need a  refill on your cardiac medications before your next appointment, please call your pharmacy*   Lab Work: Your physician recommends that you return for lab work in: 2 weeks. You do not need an appointment to have blood work done in our office. Please bring your lab slips with you.  If you have labs (blood work) drawn today and your tests are completely normal, you will receive your results only by: Marland Kitchen MyChart Message (if you have MyChart) OR . A paper copy in the mail If you have any lab test that is abnormal or we need to change your treatment, we will call you to review the results.  Follow-Up: At [facility], you and your health needs are our priority.  As part of our continuing mission to provide you with exceptional heart care, we have created designated Provider Care Teams.  These Care Teams include your primary Cardiologist (physician) and Advanced Practice Providers (APPs -  Physician Assistants and Nurse Practitioners) who all work together to provide you with the care you need, when you need it.  We recommend signing up for the patient portal called "MyChart".  Sign up information is provided on this After Visit Summary.  MyChart is used to connect with patients for Virtual Visits (Telemedicine).  Patients are able to view lab/test results, encounter notes, upcoming appointments, etc.  Non-urgent messages can be sent to your provider as well.   To learn more about what you can do with MyChart, go to [email or web address].    Your next appointment:   2-3 month(s)  The format for your next appointment:   In Person  Provider:   Kerin Ransom, PA-C     Signed, Kerin Ransom, PA-C  02/26/2020 2:49 PM    [place]

## 2020-02-26 NOTE — Assessment & Plan Note (Signed)
Pt's weight has come down some.  Since she is off work she has been more complaint with diuretic Rx.  She does have urinary incontinence which makes diuresis difficult for her.

## 2020-02-26 NOTE — Assessment & Plan Note (Signed)
On oral agents

## 2020-02-26 NOTE — Assessment & Plan Note (Addendum)
In AF last few office visits- no AC secondary to bleeding risk

## 2020-02-26 NOTE — Assessment & Plan Note (Signed)
BMI 57

## 2020-03-14 ENCOUNTER — Other Ambulatory Visit: Payer: Self-pay

## 2020-03-14 ENCOUNTER — Encounter (HOSPITAL_COMMUNITY): Payer: Self-pay | Admitting: Emergency Medicine

## 2020-03-14 ENCOUNTER — Inpatient Hospital Stay (HOSPITAL_COMMUNITY)
Admission: EM | Admit: 2020-03-14 | Discharge: 2020-03-18 | DRG: 871 | Disposition: A | Discharge status: Home / Self Care | Payer: BC Managed Care – PPO | Attending: Internal Medicine | Admitting: Internal Medicine

## 2020-03-14 ENCOUNTER — Emergency Department (HOSPITAL_COMMUNITY): Payer: BC Managed Care – PPO

## 2020-03-14 DIAGNOSIS — Z6841 Body Mass Index (BMI) 40.0 and over, adult: Secondary | ICD-10-CM

## 2020-03-14 DIAGNOSIS — B954 Other streptococcus as the cause of diseases classified elsewhere: Secondary | ICD-10-CM | POA: Diagnosis present

## 2020-03-14 DIAGNOSIS — Z87891 Personal history of nicotine dependence: Secondary | ICD-10-CM

## 2020-03-14 DIAGNOSIS — B955 Unspecified streptococcus as the cause of diseases classified elsewhere: Secondary | ICD-10-CM | POA: Diagnosis present

## 2020-03-14 DIAGNOSIS — I872 Venous insufficiency (chronic) (peripheral): Secondary | ICD-10-CM | POA: Diagnosis present

## 2020-03-14 DIAGNOSIS — E876 Hypokalemia: Secondary | ICD-10-CM | POA: Diagnosis present

## 2020-03-14 DIAGNOSIS — Z7984 Long term (current) use of oral hypoglycemic drugs: Secondary | ICD-10-CM

## 2020-03-14 DIAGNOSIS — M1990 Unspecified osteoarthritis, unspecified site: Secondary | ICD-10-CM | POA: Diagnosis present

## 2020-03-14 DIAGNOSIS — K7581 Nonalcoholic steatohepatitis (NASH): Secondary | ICD-10-CM | POA: Diagnosis present

## 2020-03-14 DIAGNOSIS — I4891 Unspecified atrial fibrillation: Secondary | ICD-10-CM

## 2020-03-14 DIAGNOSIS — R7881 Bacteremia: Principal | ICD-10-CM | POA: Diagnosis present

## 2020-03-14 DIAGNOSIS — R161 Splenomegaly, not elsewhere classified: Secondary | ICD-10-CM | POA: Diagnosis present

## 2020-03-14 DIAGNOSIS — R519 Headache, unspecified: Secondary | ICD-10-CM

## 2020-03-14 DIAGNOSIS — E785 Hyperlipidemia, unspecified: Secondary | ICD-10-CM | POA: Diagnosis present

## 2020-03-14 DIAGNOSIS — I8510 Secondary esophageal varices without bleeding: Secondary | ICD-10-CM | POA: Diagnosis present

## 2020-03-14 DIAGNOSIS — Z20822 Contact with and (suspected) exposure to covid-19: Secondary | ICD-10-CM | POA: Diagnosis present

## 2020-03-14 DIAGNOSIS — J069 Acute upper respiratory infection, unspecified: Secondary | ICD-10-CM | POA: Diagnosis present

## 2020-03-14 DIAGNOSIS — G4733 Obstructive sleep apnea (adult) (pediatric): Secondary | ICD-10-CM | POA: Diagnosis present

## 2020-03-14 DIAGNOSIS — R079 Chest pain, unspecified: Secondary | ICD-10-CM

## 2020-03-14 DIAGNOSIS — Z86010 Personal history of colonic polyps: Secondary | ICD-10-CM

## 2020-03-14 DIAGNOSIS — R319 Hematuria, unspecified: Secondary | ICD-10-CM | POA: Diagnosis present

## 2020-03-14 DIAGNOSIS — I480 Paroxysmal atrial fibrillation: Secondary | ICD-10-CM | POA: Diagnosis present

## 2020-03-14 DIAGNOSIS — K7460 Unspecified cirrhosis of liver: Secondary | ICD-10-CM | POA: Diagnosis present

## 2020-03-14 DIAGNOSIS — E1169 Type 2 diabetes mellitus with other specified complication: Secondary | ICD-10-CM

## 2020-03-14 DIAGNOSIS — M797 Fibromyalgia: Secondary | ICD-10-CM | POA: Diagnosis present

## 2020-03-14 DIAGNOSIS — R32 Unspecified urinary incontinence: Secondary | ICD-10-CM | POA: Diagnosis present

## 2020-03-14 DIAGNOSIS — I110 Hypertensive heart disease with heart failure: Secondary | ICD-10-CM | POA: Diagnosis present

## 2020-03-14 DIAGNOSIS — Z91013 Allergy to seafood: Secondary | ICD-10-CM

## 2020-03-14 DIAGNOSIS — N938 Other specified abnormal uterine and vaginal bleeding: Secondary | ICD-10-CM | POA: Diagnosis present

## 2020-03-14 DIAGNOSIS — I272 Pulmonary hypertension, unspecified: Secondary | ICD-10-CM | POA: Diagnosis present

## 2020-03-14 DIAGNOSIS — Z888 Allergy status to other drugs, medicaments and biological substances status: Secondary | ICD-10-CM

## 2020-03-14 DIAGNOSIS — R0600 Dyspnea, unspecified: Secondary | ICD-10-CM

## 2020-03-14 DIAGNOSIS — I5043 Acute on chronic combined systolic (congestive) and diastolic (congestive) heart failure: Secondary | ICD-10-CM | POA: Diagnosis present

## 2020-03-14 DIAGNOSIS — Z9049 Acquired absence of other specified parts of digestive tract: Secondary | ICD-10-CM

## 2020-03-14 DIAGNOSIS — Z79899 Other long term (current) drug therapy: Secondary | ICD-10-CM

## 2020-03-14 DIAGNOSIS — Z83438 Family history of other disorder of lipoprotein metabolism and other lipidemia: Secondary | ICD-10-CM

## 2020-03-14 DIAGNOSIS — E119 Type 2 diabetes mellitus without complications: Secondary | ICD-10-CM

## 2020-03-14 DIAGNOSIS — R6520 Severe sepsis without septic shock: Secondary | ICD-10-CM

## 2020-03-14 DIAGNOSIS — Z8249 Family history of ischemic heart disease and other diseases of the circulatory system: Secondary | ICD-10-CM

## 2020-03-14 MED ORDER — LACTATED RINGERS IV BOLUS (SEPSIS)
1000.0000 mL | Freq: Once | INTRAVENOUS | Status: AC
  Administered 2020-03-15: 1000 mL via INTRAVENOUS

## 2020-03-14 MED ORDER — SODIUM CHLORIDE 0.9 % IV SOLN
1.0000 g | INTRAVENOUS | Status: DC
  Administered 2020-03-15: 1 g via INTRAVENOUS
  Filled 2020-03-14: qty 10

## 2020-03-14 NOTE — ED Provider Notes (Addendum)
[facility] EMERGENCY DEPARTMENT Provider Note   CSN: [id number] Arrival date & time: 03/14/20  2324     History Chief Complaint  Patient presents with   Tachycardia    Hannah Khan is a 64 y.o. female.  HPI    65 year old with history of diabetes, liver cirrhosis with thrombocytopenia, persistent A. fib, elevated BMI comes in a chief complaint of tachycardia and dizziness.  Patient reports that she has been having intermittent episodes of palpitations over the last couple of weeks.  Over the last 2 days her symptoms have been more severe.  She has had dizziness when she gets up.  Patient's husband reports that she had a syncopal episode while in the bed few weeks back.  Patient has no chest pain but she does have some associated shortness of breath.  Patient is noted to be febrile.  She denies any UTI-like symptoms, new cough and awareness of fevers prior to ED arrival.  She denies any history of UTIs but does indicate that she has had cellulitis in the past.  She is having some abdominal discomfort on review of system and is status post cholecystectomy.  No nausea, vomiting, diarrhea at this time.  Review of system is also positive for headaches.  Patient reports that she always has headaches, however over the last 2 weeks the headaches have been fairly constant.  The headaches are frontal and described as moderate to severe type headaches.  No associated focal numbness, weakness, tingling.   Patient was started on new medication recently for her incontinence.  Patient also is on Megace for dysfunctional bleeding.  She denies any history of blood clots in her legs or lungs.  Past Medical History:  Diagnosis Date   Anemia    hx of   Arthritis    knees   Diabetes mellitus without complication (HCC)    type 2   Fibromyalgia    HTN (hypertension)    Hx of colonic polyps    s/p partial colectomy   Hyperlipidemia    Joint pain    in knees and back  spasms   Left leg swelling    wear compression hose   Liver cirrhosis secondary to NASH (nonalcoholic steatohepatitis) ([place]) dx nov 2014   had enlarged spleen also    Low blood pressure    bp varies   Morbid obesity (HCC)    Persistent atrial fibrillation ([place])    Pneumonia 10/2016   PONV (postoperative nausea and vomiting)    in past none recent   Portal hypertension ([place])    Sleep apnea    uses cpap setting of 14   Spleen enlarged    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 [facility]     Patient Active Problem List   Diagnosis Date Noted   A-fib ([place]) 03/15/2020   UTI (urinary tract infection) 04/26/2018   Liver cirrhosis secondary to NASH (nonalcoholic steatohepatitis) ([place]) 04/26/2018   Hyperlipidemia 04/26/2018   Type 2 diabetes mellitus with other specified complication ([place])    Cellulitis of right lower extremity 03/15/2018   Severe sepsis ([place]) 03/14/2018   Community acquired pneumonia of right lower lobe of lung 10/31/2016   Muscular abdominal pain in right upper quadrant 07/11/2016   Chronic diastolic CHF (congestive heart failure) ([place]) 07/10/2016   Thrombocytopenia ([place]) 07/10/2016   Permanent atrial fibrillation ([place]) 01/30/2016   Type 2 diabetes mellitus without complication, without long-term current use of insulin ([place]) 01/30/2016   Acute on chronic combined systolic and diastolic  CHF (congestive heart failure) ([place]) 01/26/2016   Morbid obesity- 12/10/2015   Persistent atrial fibrillation ([place]) 12/08/2015   [place] 01/31/2015   Aortic heart murmur 01/31/2015   Pancytopenia ([place]) 07/23/2013   Obstructive sleep apnea 08/07/2007   Essential hypertension 08/07/2007   ALLERGIC RHINITIS 08/07/2007    Past Surgical History:  Procedure Laterality Date   BREAST LUMPECTOMY WITH RADIOACTIVE SEED LOCALIZATION Right 08/29/2017   Procedure: RIGHT BREAST LUMPECTOMY WITH RADIOACTIVE SEEDS LOCALIZATION;  Surgeon: Erroll Luna, MD;  Location:  [place] [facility];  Service: General;  Laterality: Right;   [place]  20 yrs ago   COLECTOMY  2011   COLONOSCOPY     COLONOSCOPY N/A 09/05/2018   Procedure: COLONOSCOPY;  Surgeon: [name], MD;  Location: [facility];  Service: Endoscopy;  Laterality: N/A;   DILATATION & CURETTAGE/HYSTEROSCOPY WITH MYOSURE N/A 05/06/2016   Procedure: DILATATION & CURETTAGE/HYSTEROSCOPY WITH MYOSURE WITH POLYPECTOMY;  Surgeon: Janyth Pupa, DO;  Location: [facility];  Service: Gynecology;  Laterality: N/A;   ESOPHAGOGASTRODUODENOSCOPY (EGD) WITH PROPOFOL N/A 09/04/2013   Procedure: ESOPHAGOGASTRODUODENOSCOPY (EGD) WITH PROPOFOL;  Surgeon: [name], MD;  Location: [facility];  Service: Endoscopy;  Laterality: N/A;   KNEE ARTHROSCOPY  yrs ago   bilateral, one done x 1, one done twice   POLYPECTOMY  09/05/2018   Procedure: POLYPECTOMY;  Surgeon: [name], MD;  Location: [facility];  Service: Endoscopy;;     OB History   No obstetric history on file.     Family History  Problem Relation Age of Onset   Heart failure Mother    Cancer Mother    Hyperlipidemia Mother    Congestive Heart Failure Mother    Stroke Mother    Lung cancer Father    Cancer Father    Cancer Brother    Hyperlipidemia Brother    Stroke Other     Social History   Tobacco Use   Smoking status: Former Smoker    Packs/day: 1.00    Years: 3.00    Pack years: 3.00    Types: Cigarettes    Quit date: 09/19/1974    Years since quitting: 45.5   Smokeless tobacco: Never Used  Vaping Use   Vaping Use: Never used  Substance Use Topics   Alcohol use: Yes    Comment: occ   Drug use: No    Home Medications Prior to Admission medications   Medication Sig Start Date End Date Taking? Authorizing Provider  albuterol (PROVENTIL HFA;VENTOLIN HFA) 108 (90 Base) MCG/ACT inhaler Inhale 2 puffs into the lungs every 6 (six) hours as needed for wheezing or shortness of breath.   11/01/18  Yes [provider]  bumetanide (BUMEX) 2 MG tablet Take 1 tablet (2 mg total) by mouth 2 (two) times daily. 02/26/20  Yes Kilroy, Luke K, PA-C  diltiazem (CARDIZEM CD) 300 MG 24 hr capsule TAKE 1 CAPSULE BY MOUTH EVERY DAY Patient taking differently: Take 300 mg by mouth daily.  08/14/19  Yes Skeet Latch, MD  JARDIANCE 25 MG TABS tablet Take 25 mg by mouth daily after breakfast.  01/28/17  Yes [provider]  megestrol (MEGACE) 20 MG tablet Take 20 mg by mouth daily.   Yes [provider]  mirabegron ER (MYRBETRIQ) 50 MG TB24 tablet Take 50 mg by mouth daily.   Yes [provider]  propranolol (INDERAL) 10 MG tablet TAKE 1 TABLET BY MOUTH TWICE A DAY Patient taking differently: Take 10 mg by mouth  2 (two) times daily.  01/29/20  Yes Skeet Latch, MD  spironolactone (ALDACTONE) 25 MG tablet TAKE 1 TABLET (25 MG TOTAL) BY MOUTH DAILY. SCHEDULE OV FOR FURTHER REFILLS. 12/27/19  Yes Skeet Latch, MD  KLOR-CON M20 20 MEQ tablet Take 1 tablet (20 mEq total) by mouth daily. Patient not taking: Reported on 03/15/2020 03/27/18   Barrett, Evelene Croon, PA-C    Allergies    Celecoxib, Shellfish allergy, Barium-containing compounds, Prednisone, Statins, Fentanyl, Ibuprofen, Tramadol, and Tylenol [acetaminophen]  Review of Systems   Review of Systems  Constitutional: Positive for activity change.  Eyes: Negative for visual disturbance.  Respiratory: Positive for shortness of breath.   Cardiovascular: Negative for chest pain.  Gastrointestinal: Negative for nausea and vomiting.  Neurological: Positive for dizziness.  All other systems reviewed and are negative.   Physical Exam Updated Vital Signs BP 129/62 (BP Location: Right Arm)    Pulse (!) 162    Temp 98.5 F (36.9 C) (Oral)    Resp 15    Ht 5' 7"  (1.702 m)    Wt (!) 165.6 kg    SpO2 95%    BMI 57.17 kg/m   Physical Exam Vitals and nursing note reviewed.  Constitutional:      Appearance:  She is well-developed.  HENT:     Head: Normocephalic and atraumatic.  Cardiovascular:     Rate and Rhythm: Normal rate.  Pulmonary:     Effort: Pulmonary effort is normal.  Abdominal:     General: Bowel sounds are normal.  Musculoskeletal:     Cervical back: Normal range of motion and neck supple.  Skin:    General: Skin is warm and dry.  Neurological:     Mental Status: She is alert and oriented to person, place, and time.     ED Results / Procedures / Treatments   Labs (all labs ordered are listed, but only abnormal results are displayed) Labs Reviewed  BASIC METABOLIC PANEL - Abnormal; Notable for the following components:      Result Value   Potassium 3.3 (*)    Glucose, Bld 216 (*)    All other components within normal limits  PROTIME-INR - Abnormal; Notable for the following components:   Prothrombin Time 15.6 (*)    INR 1.3 (*)    All other components within normal limits  BRAIN NATRIURETIC PEPTIDE - Abnormal; Notable for the following components:   B Natriuretic Peptide 340.2 (*)    All other components within normal limits  CBC WITH DIFFERENTIAL/PLATELET - Abnormal; Notable for the following components:   Platelets 32 (*)    Lymphs Abs 0.4 (*)    All other components within normal limits  D-DIMER, QUANTITATIVE (NOT AT [facility]) - Abnormal; Notable for the following components:   D-Dimer, Quant 2.35 (*)    All other components within normal limits  URINALYSIS, ROUTINE W REFLEX MICROSCOPIC - Abnormal; Notable for the following components:   Specific Gravity, Urine 1.037 (*)    Glucose, UA >=500 (*)    Hgb urine dipstick LARGE (*)    RBC / HPF >50 (*)    Bacteria, UA FEW (*)    All other components within normal limits  LACTIC ACID, PLASMA - Abnormal; Notable for the following components:   Lactic Acid, Venous 2.2 (*)    All other components within normal limits  SARS CORONAVIRUS 2 BY RT PCR (HOSPITAL ORDER, [place] [facility])  URINE CULTURE    CULTURE, BLOOD (ROUTINE X  2)  CULTURE, BLOOD (ROUTINE X 2)  MAGNESIUM  APTT  LACTIC ACID, PLASMA  HIV ANTIBODY (ROUTINE TESTING W REFLEX)  BASIC METABOLIC PANEL  TSH  PROCALCITONIN  TYPE AND SCREEN  PREPARE PLATELET PHERESIS    EKG EKG Interpretation  Date/Time:  Saturday March 14 2020 23:29:17 EDT Ventricular Rate:  125 PR Interval:    QRS Duration: 94 QT Interval:  305 QTC Calculation: 440 R Axis:   -50 Text Interpretation: Atrial fibrillation LAD, consider left anterior fascicular block Low voltage, precordial leads Abnormal R-wave progression, late transition No acute changes has history of AFib Confirmed by Varney Biles [phone]) on 03/14/2020 11:30:26 PM   Radiology CT Angio Chest PE W and/or Wo Contrast  Result Date: 03/15/2020 CLINICAL DATA:  Shortness of breath. Elevated D-dimer. On estrogen replacement therapy. EXAM: CT ANGIOGRAPHY CHEST WITH CONTRAST TECHNIQUE: Multidetector CT imaging of the chest was performed using the standard protocol during bolus administration of intravenous contrast. Multiplanar CT image reconstructions and MIPs were obtained to evaluate the vascular anatomy. CONTRAST:  158m OMNIPAQUE IOHEXOL 350 MG/ML SOLN COMPARISON:  None. FINDINGS: Cardiovascular: Suboptimal opacification of the pulmonary arteries due to bolus timing, limiting evaluation beyond the proximal lobar branches. Within that limitation, there is no central pulmonary embolus. The size of the main pulmonary artery is enlarged, measuring 3.3 cm. Heart size is normal, with no pericardial effusion. The course and caliber of the aorta are normal. There is no atherosclerotic calcification. Opacification decreased due to pulmonary arterial phase contrast bolus timing. There is an aberrant right subclavian artery. Mediastinum/Nodes: No mediastinal, hilar or axillary lymphadenopathy. Normal visualized thyroid. Thoracic esophageal course is normal. Lungs/Pleura: Airways are patent. No pleural  effusion, lobar consolidation, pneumothorax or pulmonary infarction. Upper Abdomen: Contrast bolus timing is not optimized for evaluation of the abdominal organs. The spleen is enlarged measuring 19.7 cm in AP dimension. Musculoskeletal: No chest wall abnormality. No bony spinal canal stenosis. Review of the MIP images confirms the above findings. IMPRESSION: 1. Suboptimal opacification of the pulmonary arteries due to bolus timing, limiting evaluation beyond the proximal lobar branches. Within that limitation, there is no large central pulmonary embolus. 2. Enlarged main pulmonary artery, consistent with pulmonary hypertension. 3. Splenomegaly. Electronically Signed   By: KUlyses JarredM.D.   On: 03/15/2020 02:41   DG Chest Port 1 View  Result Date: 03/15/2020 CLINICAL DATA:  Shortness of breath EXAM: PORTABLE CHEST 1 VIEW COMPARISON:  None. FINDINGS: Moderate cardiomegaly. Both lungs are clear. The visualized skeletal structures are unremarkable. IMPRESSION: Moderate cardiomegaly without focal airspace disease. Electronically Signed   By: KUlyses JarredM.D.   On: 03/15/2020 00:25    Procedures .Critical Care Performed by: NVarney Biles MD Authorized by: NVarney Biles MD   Critical care provider statement:    Critical care time (minutes):  65   Critical care was necessary to treat or prevent imminent or life-threatening deterioration of the following conditions:  Circulatory failure   Critical care was time spent personally by me on the following activities:  Discussions with consultants, evaluation of patient's response to treatment, examination of patient, ordering and performing treatments and interventions, ordering and review of laboratory studies, ordering and review of radiographic studies, pulse oximetry, re-evaluation of patient's condition, obtaining history from patient or surrogate and review of old charts   (including critical care time)  Medications Ordered in ED Medications    cefTRIAXone (ROCEPHIN) 1 g in sodium chloride 0.9 % 100 mL IVPB (0 g Intravenous Stopped 03/15/20 0103)  diltiazem (CARDIZEM) 1 mg/mL load via infusion 15 mg (15 mg Intravenous Bolus from Bag 03/15/20 0255)    And  diltiazem (CARDIZEM) 125 mg in dextrose 5% 125 mL (1 mg/mL) infusion (12.5 mg/hr Intravenous Rate/Dose Change 03/15/20 0543)  sodium chloride flush (NS) 0.9 % injection 3 mL (has no administration in time range)  ondansetron (ZOFRAN) tablet 4 mg (has no administration in time range)    Or  ondansetron (ZOFRAN) injection 4 mg (has no administration in time range)  levalbuterol (XOPENEX) nebulizer solution 0.63 mg (has no administration in time range)  metoprolol tartrate (LOPRESSOR) tablet 25 mg (has no administration in time range)  piperacillin-tazobactam (ZOSYN) IVPB 3.375 g (has no administration in time range)  piperacillin-tazobactam (ZOSYN) IVPB 3.375 g (has no administration in time range)  0.9 %  sodium chloride infusion (Manually program via Guardrails IV Fluids) (has no administration in time range)  magnesium sulfate IVPB 2 g 50 mL (has no administration in time range)  potassium chloride (KLOR-CON) packet 40 mEq (has no administration in time range)  lactated ringers bolus 1,000 mL (0 mLs Intravenous Stopped 03/15/20 0138)    And  lactated ringers bolus 1,000 mL (0 mLs Intravenous Stopped 03/15/20 0136)  iohexol (OMNIPAQUE) 350 MG/ML injection 100 mL (100 mLs Intravenous Contrast Given 03/15/20 0235)  HYDROmorphone (DILAUDID) injection 1 mg (1 mg Intravenous Given 03/15/20 0337)  metoCLOPramide (REGLAN) injection 10 mg (10 mg Intravenous Given 03/15/20 4696)    ED Course  I have reviewed the triage vital signs and the nursing notes.  Pertinent labs & imaging results that were available during my care of the patient were reviewed by me and considered in my medical decision making (see chart for details).  Clinical Course as of Mar 16 615  Sun Mar 15, 2020  0613 Nursing  staff had made me aware few minutes back that patient's heart rate is elevated.  At that time patient was on 5 milligrams per hour drip.  I advised him to slowly go up on the drip as per the order, unless the patient becomes hypotensive.  Patient currently on 12.5 mg/h.  They informed me that patient is still tachycardic.  I have sent a message to the hospitalist staff to see if they have any thoughts on the A. Fib.  Not ordering digoxin or amio at this time.   [AN]    Clinical Course User Index [AN] Varney Biles, MD   MDM Rules/Calculators/A&P                          64 year old female comes in a chief complaint of palpitations and dizziness. She has history of persistent A. fib, not on any anticoagulation.  Also has liver cirrhosis and thrombocytopenia history along with diabetes and elevated BMI.  Patient is also on oral contraceptives for DUB.  Patient is noted to be febrile in the ED.  She was unaware of fevers.  Review of system is not indicative of any specific source of infection, patient has had cellulitis before.  She is reporting incontinence right now which is new.  We will give her antibiotics right now.  The underlying cause is likely infection versus PE, the latter because patient is having shortness of breath and is on oral contraceptives.   D-dimer was ordered to evaluate for PE.  It is elevated, CT PE ordered and is not indicated of any large PE. Initial blood work-up was also overall reassuring  Diltiazem drip  has been initiated.  Patient has been given fluids and we will admit her for rate control and further investigation of her fever.  Final Clinical Impression(s) / ED Diagnoses Final diagnoses:  Atrial fibrillation with RVR (HCC)  Chronic nonintractable headache, unspecified headache type    Rx / DC Orders ED Discharge Orders         Ordered    Amb referral to [place]     03/15/20 0607    Amb referral to [place]     03/14/20 [place], Jeanell Mangan, MD 03/15/20 [phone]

## 2020-03-14 NOTE — ED Notes (Signed)
Provider at bedside

## 2020-03-14 NOTE — ED Triage Notes (Signed)
PT arrived from home via [facility]. Pt c/o palpitations. Upon arrival pt found to be in afib w/ RVR 170-200 HR. Per pt she has had low PO intake recently. PT given approx 150 ML fluid and vagel techniques preformed which brought pt hr down to 100-140 HR.  Per pt she is not on blood thinners and ASA contraindicated due to low plantlet counts.  At this time pt a+O x4, denies cp, 129 HR

## 2020-03-15 ENCOUNTER — Emergency Department (HOSPITAL_COMMUNITY): Payer: BC Managed Care – PPO

## 2020-03-15 DIAGNOSIS — R161 Splenomegaly, not elsewhere classified: Secondary | ICD-10-CM | POA: Diagnosis present

## 2020-03-15 DIAGNOSIS — G8929 Other chronic pain: Secondary | ICD-10-CM

## 2020-03-15 DIAGNOSIS — G4733 Obstructive sleep apnea (adult) (pediatric): Secondary | ICD-10-CM | POA: Diagnosis present

## 2020-03-15 DIAGNOSIS — Z6841 Body Mass Index (BMI) 40.0 and over, adult: Secondary | ICD-10-CM | POA: Diagnosis not present

## 2020-03-15 DIAGNOSIS — I4819 Other persistent atrial fibrillation: Secondary | ICD-10-CM | POA: Diagnosis not present

## 2020-03-15 DIAGNOSIS — B955 Unspecified streptococcus as the cause of diseases classified elsewhere: Secondary | ICD-10-CM | POA: Diagnosis not present

## 2020-03-15 DIAGNOSIS — R7881 Bacteremia: Secondary | ICD-10-CM | POA: Diagnosis present

## 2020-03-15 DIAGNOSIS — J069 Acute upper respiratory infection, unspecified: Secondary | ICD-10-CM | POA: Diagnosis present

## 2020-03-15 DIAGNOSIS — I4891 Unspecified atrial fibrillation: Secondary | ICD-10-CM | POA: Diagnosis present

## 2020-03-15 DIAGNOSIS — I872 Venous insufficiency (chronic) (peripheral): Secondary | ICD-10-CM | POA: Diagnosis present

## 2020-03-15 DIAGNOSIS — A419 Sepsis, unspecified organism: Secondary | ICD-10-CM

## 2020-03-15 DIAGNOSIS — K7460 Unspecified cirrhosis of liver: Secondary | ICD-10-CM

## 2020-03-15 DIAGNOSIS — E876 Hypokalemia: Secondary | ICD-10-CM | POA: Diagnosis present

## 2020-03-15 DIAGNOSIS — R6520 Severe sepsis without septic shock: Secondary | ICD-10-CM

## 2020-03-15 DIAGNOSIS — Z20822 Contact with and (suspected) exposure to covid-19: Secondary | ICD-10-CM | POA: Diagnosis present

## 2020-03-15 DIAGNOSIS — K7581 Nonalcoholic steatohepatitis (NASH): Secondary | ICD-10-CM | POA: Diagnosis present

## 2020-03-15 DIAGNOSIS — M797 Fibromyalgia: Secondary | ICD-10-CM | POA: Diagnosis present

## 2020-03-15 DIAGNOSIS — B954 Other streptococcus as the cause of diseases classified elsewhere: Secondary | ICD-10-CM | POA: Diagnosis present

## 2020-03-15 DIAGNOSIS — I8510 Secondary esophageal varices without bleeding: Secondary | ICD-10-CM | POA: Diagnosis present

## 2020-03-15 DIAGNOSIS — N938 Other specified abnormal uterine and vaginal bleeding: Secondary | ICD-10-CM | POA: Diagnosis present

## 2020-03-15 DIAGNOSIS — I480 Paroxysmal atrial fibrillation: Secondary | ICD-10-CM | POA: Diagnosis present

## 2020-03-15 DIAGNOSIS — R319 Hematuria, unspecified: Secondary | ICD-10-CM | POA: Diagnosis present

## 2020-03-15 DIAGNOSIS — I482 Chronic atrial fibrillation, unspecified: Secondary | ICD-10-CM

## 2020-03-15 DIAGNOSIS — R519 Headache, unspecified: Secondary | ICD-10-CM

## 2020-03-15 DIAGNOSIS — R32 Unspecified urinary incontinence: Secondary | ICD-10-CM | POA: Diagnosis present

## 2020-03-15 DIAGNOSIS — E785 Hyperlipidemia, unspecified: Secondary | ICD-10-CM | POA: Diagnosis present

## 2020-03-15 DIAGNOSIS — E119 Type 2 diabetes mellitus without complications: Secondary | ICD-10-CM | POA: Diagnosis present

## 2020-03-15 DIAGNOSIS — I5043 Acute on chronic combined systolic (congestive) and diastolic (congestive) heart failure: Secondary | ICD-10-CM | POA: Diagnosis present

## 2020-03-15 DIAGNOSIS — E1169 Type 2 diabetes mellitus with other specified complication: Secondary | ICD-10-CM

## 2020-03-15 DIAGNOSIS — M1990 Unspecified osteoarthritis, unspecified site: Secondary | ICD-10-CM | POA: Diagnosis present

## 2020-03-15 DIAGNOSIS — I110 Hypertensive heart disease with heart failure: Secondary | ICD-10-CM | POA: Diagnosis present

## 2020-03-15 DIAGNOSIS — I272 Pulmonary hypertension, unspecified: Secondary | ICD-10-CM | POA: Diagnosis present

## 2020-03-15 LAB — BLOOD CULTURE ID PANEL (REFLEXED)

## 2020-03-15 LAB — CBC WITH DIFFERENTIAL/PLATELET
Abs Immature Granulocytes: 0.04 10*3/uL (ref 0.00–0.07)
Basophils Absolute: 0 10*3/uL (ref 0.0–0.1)
Basophils Relative: 0 %
Eosinophils Absolute: 0 10*3/uL (ref 0.0–0.5)
Eosinophils Relative: 0 %
HCT: 37.8 % (ref 36.0–46.0)
Hemoglobin: 12.4 g/dL (ref 12.0–15.0)
Immature Granulocytes: 1 %
Lymphocytes Relative: 4 %
Lymphs Abs: 0.4 10*3/uL — ABNORMAL LOW (ref 0.7–4.0)
MCH: 30.4 pg (ref 26.0–34.0)
MCHC: 32.8 g/dL (ref 30.0–36.0)
MCV: 92.6 fL (ref 80.0–100.0)
Monocytes Absolute: 0.5 10*3/uL (ref 0.1–1.0)
Monocytes Relative: 6 %
Neutro Abs: 7.7 10*3/uL (ref 1.7–7.7)
Neutrophils Relative %: 89 %
Platelets: 32 10*3/uL — ABNORMAL LOW (ref 150–400)
RBC: 4.08 MIL/uL (ref 3.87–5.11)
RDW: 14.2 % (ref 11.5–15.5)
WBC: 8.7 10*3/uL (ref 4.0–10.5)
nRBC: 0 % (ref 0.0–0.2)

## 2020-03-15 LAB — TYPE AND SCREEN
ABO/RH(D): O POS
Antibody Screen: NEGATIVE

## 2020-03-15 LAB — URINALYSIS, ROUTINE W REFLEX MICROSCOPIC
Bilirubin Urine: NEGATIVE
Glucose, UA: >=500 mg/dL — AB
Ketones, ur: NEGATIVE mg/dL
Leukocytes,Ua: NEGATIVE
Nitrite: NEGATIVE
Protein, ur: NEGATIVE mg/dL
RBC / HPF: >50 RBC/hpf — ABNORMAL HIGH (ref 0–5)
Specific Gravity, Urine: 1.037 — ABNORMAL HIGH (ref 1.005–1.030)
pH: 6 (ref 5.0–8.0)

## 2020-03-15 LAB — LACTIC ACID, PLASMA
Lactic Acid, Venous: 1.8 mmol/L (ref 0.5–1.9)
Lactic Acid, Venous: 2.2 mmol/L (ref 0.5–1.9)
Lactic Acid, Venous: 1.9 mmol/L (ref 0.5–1.9)

## 2020-03-15 LAB — BPAM PLATELET PHERESIS
Blood Product Expiration Date: 202106282359
Blood Product Expiration Date: 202106282359
Unit Type and Rh: 5100
Unit Type and Rh: 7300

## 2020-03-15 LAB — BASIC METABOLIC PANEL
Anion gap: 8 (ref 5–15)
Anion gap: 14 (ref 5–15)
BUN: 15 mg/dL (ref 8–23)
BUN: 16 mg/dL (ref 8–23)
CO2: 22 mmol/L (ref 22–32)
CO2: 19 mmol/L — ABNORMAL LOW (ref 22–32)
Calcium: 9.3 mg/dL (ref 8.9–10.3)
Calcium: 8.9 mg/dL (ref 8.9–10.3)
Chloride: 107 mmol/L (ref 98–111)
Chloride: 104 mmol/L (ref 98–111)
Creatinine, Ser: 0.87 mg/dL (ref 0.44–1.00)
Creatinine, Ser: 0.88 mg/dL (ref 0.44–1.00)
GFR calc Af Amer: >60 mL/min (ref >60)
GFR calc Af Amer: >60 mL/min (ref >60)
GFR calc non Af Amer: >60 mL/min (ref >60)
GFR calc non Af Amer: >60 mL/min (ref >60)
Glucose, Bld: 216 mg/dL — ABNORMAL HIGH (ref 70–99)
Glucose, Bld: 205 mg/dL — ABNORMAL HIGH (ref 70–99)
Potassium: 3.3 mmol/L — ABNORMAL LOW (ref 3.5–5.1)
Potassium: 4.1 mmol/L (ref 3.5–5.1)
Sodium: 137 mmol/L (ref 135–145)
Sodium: 137 mmol/L (ref 135–145)

## 2020-03-15 LAB — PROTIME-INR
INR: 1.3 — ABNORMAL HIGH (ref 0.8–1.2)
Prothrombin Time: 15.6 seconds — ABNORMAL HIGH (ref 11.4–15.2)

## 2020-03-15 LAB — GLUCOSE, CAPILLARY: Glucose-Capillary: 186 mg/dL — ABNORMAL HIGH (ref 70–99)

## 2020-03-15 LAB — PREPARE PLATELET PHERESIS
Unit division: 0
Unit division: 0

## 2020-03-15 LAB — PROCALCITONIN: Procalcitonin: 0.43 ng/mL

## 2020-03-15 LAB — D-DIMER, QUANTITATIVE: D-Dimer, Quant: 2.35 ug/mL-FEU — ABNORMAL HIGH (ref 0.00–0.50)

## 2020-03-15 LAB — SARS CORONAVIRUS 2 BY RT PCR (HOSPITAL ORDER, PERFORMED IN ~~LOC~~ HOSPITAL LAB): SARS Coronavirus 2: NEGATIVE

## 2020-03-15 LAB — MAGNESIUM: Magnesium: 1.9 mg/dL (ref 1.7–2.4)

## 2020-03-15 LAB — TSH: TSH: 2.12 u[IU]/mL (ref 0.350–4.500)

## 2020-03-15 LAB — APTT: aPTT: 31 seconds (ref 24–36)

## 2020-03-15 LAB — BRAIN NATRIURETIC PEPTIDE: B Natriuretic Peptide: 340.2 pg/mL — ABNORMAL HIGH (ref 0.0–100.0)

## 2020-03-15 LAB — HIV ANTIBODY (ROUTINE TESTING W REFLEX): HIV Screen 4th Generation wRfx: NONREACTIVE

## 2020-03-15 LAB — TROPONIN I (HIGH SENSITIVITY): Troponin I (High Sensitivity): 11 ng/L (ref <18)

## 2020-03-15 MED ORDER — LABETALOL HCL 5 MG/ML IV SOLN: 10.0000 mg | Freq: Once | INTRAVENOUS | Status: AC

## 2020-03-15 MED ORDER — METOPROLOL TARTRATE 25 MG PO TABS
25.0000 mg | ORAL_TABLET | Freq: Two times a day (BID) | ORAL | Status: DC
  Administered 2020-03-15 (×2): 25 mg via ORAL
  Filled 2020-03-15 (×3): qty 1

## 2020-03-15 MED ORDER — DILTIAZEM LOAD VIA INFUSION
15.0000 mg | Freq: Once | INTRAVENOUS | Status: AC
  Administered 2020-03-15: 15 mg via INTRAVENOUS
  Filled 2020-03-15: qty 15

## 2020-03-15 MED ORDER — HYDROMORPHONE HCL 1 MG/ML IJ SOLN
1.0000 mg | Freq: Once | INTRAMUSCULAR | Status: AC
  Administered 2020-03-15: 1 mg via INTRAVENOUS
  Filled 2020-03-15: qty 1

## 2020-03-15 MED ORDER — PIPERACILLIN-TAZOBACTAM 3.375 G IVPB 30 MIN
3.3750 g | Freq: Once | INTRAVENOUS | Status: AC
  Administered 2020-03-15: 3.375 g via INTRAVENOUS
  Filled 2020-03-15: qty 50

## 2020-03-15 MED ORDER — SODIUM CHLORIDE 0.9% FLUSH
3.0000 mL | Freq: Two times a day (BID) | INTRAVENOUS | Status: DC
  Administered 2020-03-15 – 2020-03-17 (×4): 3 mL via INTRAVENOUS

## 2020-03-15 MED ORDER — ONDANSETRON HCL 4 MG PO TABS: 4.0000 mg | ORAL_TABLET | Freq: Four times a day (QID) | ORAL | Status: DC | PRN

## 2020-03-15 MED ORDER — METOCLOPRAMIDE HCL 5 MG/ML IJ SOLN
10.0000 mg | Freq: Once | INTRAMUSCULAR | Status: AC
  Administered 2020-03-15: 10 mg via INTRAVENOUS
  Filled 2020-03-15: qty 2

## 2020-03-15 MED ORDER — ONDANSETRON HCL 4 MG/2ML IJ SOLN: 4.0000 mg | Freq: Four times a day (QID) | INTRAMUSCULAR | Status: DC | PRN

## 2020-03-15 MED ORDER — LABETALOL HCL 5 MG/ML IV SOLN
INTRAVENOUS | Status: AC
  Administered 2020-03-15: 10 mg via INTRAVENOUS
  Filled 2020-03-15: qty 4

## 2020-03-15 MED ORDER — SODIUM CHLORIDE 0.9% IV SOLUTION: Freq: Once | INTRAVENOUS | Status: DC

## 2020-03-15 MED ORDER — IBUPROFEN 200 MG PO TABS: 400.0000 mg | ORAL_TABLET | Freq: Once | ORAL | Status: DC

## 2020-03-15 MED ORDER — POTASSIUM CHLORIDE 20 MEQ PO PACK
40.0000 meq | PACK | Freq: Once | ORAL | Status: AC
  Administered 2020-03-15: 40 meq via ORAL
  Filled 2020-03-15: qty 2

## 2020-03-15 MED ORDER — PIPERACILLIN-TAZOBACTAM 3.375 G IVPB
3.3750 g | Freq: Three times a day (TID) | INTRAVENOUS | Status: DC
  Administered 2020-03-15: 3.375 g via INTRAVENOUS
  Filled 2020-03-15 (×2): qty 50

## 2020-03-15 MED ORDER — SODIUM CHLORIDE 0.9 % IV SOLN
2.0000 g | INTRAVENOUS | Status: DC
  Administered 2020-03-16 – 2020-03-17 (×2): 2 g via INTRAVENOUS
  Filled 2020-03-15: qty 2
  Filled 2020-03-15 (×2): qty 20

## 2020-03-15 MED ORDER — DILTIAZEM HCL-DEXTROSE 125-5 MG/125ML-% IV SOLN (PREMIX)
5.0000 mg/h | INTRAVENOUS | Status: DC
  Administered 2020-03-15: 5 mg/h via INTRAVENOUS
  Administered 2020-03-15 – 2020-03-16 (×2): 15 mg/h via INTRAVENOUS
  Filled 2020-03-15 (×4): qty 125

## 2020-03-15 MED ORDER — MAGNESIUM SULFATE 2 GM/50ML IV SOLN
2.0000 g | Freq: Once | INTRAVENOUS | Status: AC
  Administered 2020-03-15: 2 g via INTRAVENOUS
  Filled 2020-03-15: qty 50

## 2020-03-15 MED ORDER — LEVALBUTEROL HCL 0.63 MG/3ML IN NEBU: 0.6300 mg | INHALATION_SOLUTION | Freq: Four times a day (QID) | RESPIRATORY_TRACT | Status: DC | PRN

## 2020-03-15 MED ORDER — TRAMADOL HCL 50 MG PO TABS
50.0000 mg | ORAL_TABLET | Freq: Once | ORAL | Status: AC
  Administered 2020-03-15: 50 mg via ORAL
  Filled 2020-03-15: qty 1

## 2020-03-15 MED ORDER — SODIUM CHLORIDE 0.9 % IV SOLN
2.0000 g | INTRAVENOUS | Status: DC
  Filled 2020-03-15: qty 20

## 2020-03-15 MED ORDER — IOHEXOL 350 MG/ML SOLN
100.0000 mL | Freq: Once | INTRAVENOUS | Status: AC | PRN
  Administered 2020-03-15: 100 mL via INTRAVENOUS

## 2020-03-15 NOTE — ED Notes (Signed)
Pt has been sat up in a chair to wash off with soap/waster, c assistance. She tolerated this well, pulse is in the 120's at this time, metoprolol & labetalol has been effective so far. Her O2 via n/c has been removed (since she does not were O2 at baseline) & she is sating at 94% on RA. She is sitting on the side of the bed at this time for comfort & no distress noted, will monitor.

## 2020-03-15 NOTE — Progress Notes (Signed)
PROGRESS NOTE    LOELLA HICKLE  NWG:[id number] DOB: [birth year] DOA: 03/14/2020 PCP: Lujean Amel, MD  Brief Narrative: Hannah Khan is a 64 y.o. female with medical history significant of  with a hx of morbid obesity with a BMI of 57, cirrhosis with thrombocytopenia, chronic atrial fibrillation not on anticoagulation, Chronic diastolic CHF, DM 2, hx of dysfunction uterine bleeding, urinary incontinence. Patient presented to ed by EMS with dizziness and tachycardia. -Patient reports seeing a gynecologist recently for DU B, had an IUD placed in May and is scheduled for hysteroscopy in early July. -Patient reports not feeling well for a few weeks, recently with dizziness, palpitations as well as mild right-sided abdominal discomfort.  She denies any dysuria or abnormal pelvic discharge apart from intermittent blood. -In the emergency room her temp was 100.8, heart rate ranged from 240-260 with A. fib, blood pressure was in the 140s and O2 sats were 96% on 2 L.  She had a CT chest which was negative for PE, noted pulmonary hypertension and splenomegaly -Labs noted white count of 8.7, hemoglobin of 12.4, platelet of 32 -Urinalysis positive for blood   Assessment & Plan:   Paroxysmal atrial fibrillation with RVR -Currently on diltiazem drip at 15 mg/h, also started on oral metoprolol instead of home regimen of propranolol -Not on anticoagulation due to high risk of bleeding in the setting of cirrhosis/severe thrombocytopenia -Request cardiology consult -Last echo in 02/2018 noted preserved EF, poor quality  Possible sepsis -Low-grade fever on admission with mild leukocytosis -Source is unclear, urinalysis with hematuria, likely secondary to her known longstanding history of DU B -Agree with CT abdomen pelvis -Continue IV Zosyn, follow-up blood cultures  Chronic diastolic CHF -Diuretics on hold in the setting of above  Liver cirrhosis/NASH Chronic thrombocytopenia -Check CMP in a.m.  for albumin -Discontinue platelet transfusion  Type 2 diabetes mellitus -Oral hypoglycemics on hold, sliding scale insulin for now  Obstructive sleep apnea -Continue CPAP nightly  Morbid obesity -BMI of 56.9  DVT prophylaxis: SCDs Code Status: Full code Family Communication: Discussed with patient in detail, no family at bedside Disposition Plan:  Status is: Inpatient  Remains inpatient appropriate because:Inpatient level of care appropriate due to severity of illness   Dispo: The patient is from: Home              Anticipated d/c is to: Home              Anticipated d/c date is: 3 days              Patient currently is not medically stable to d/c.  Consultants:   Cards   Procedures:   Antimicrobials:    Subjective: -Complains of weakness dizziness and palpitations, vague right-sided abdominal discomfort  Objective: Vitals:   03/15/20 0900 03/15/20 0945 03/15/20 1000 03/15/20 1015  BP: 104/61 108/69 108/72 135/88  Pulse:  (!) 119 (!) 129 (!) 125  Resp: 16 20 19 18   Temp:      TempSrc:      SpO2:  96% 96% 97%  Weight:      Height:       No intake or output data in the 24 hours ending 03/15/20 1046 Filed Weights   03/14/20 2330 03/15/20 0714  Weight: (!) 165.6 kg (!) 165 kg    Examination:  General exam: Morbidly obese chronically ill female, appears much older than stated age, awake alert oriented to self, place Respiratory system: Decreased breath sounds the bases Cardiovascular system:  S1 & S2 heard, irregularly irregular  gastrointestinal system: Morbidly obese abdomen with large pannus, soft, skin discoloration noted, nontender, no rigidity or guarding, bowel sounds present Central nervous system: Alert and oriented. No focal neurological deficits. Extremities: No edema, chronic venous stasis changes Skin: Discoloration and chronic venous stasis changes in both lower legs Psychiatry: Judgement and insight appear normal. Mood & affect appropriate.     Data Reviewed:   CBC: Recent Labs  Lab 03/15/20 0033  WBC 8.7  NEUTROABS 7.7  HGB 12.4  HCT 37.8  MCV 92.6  PLT 32*   Basic Metabolic Panel: Recent Labs  Lab 03/15/20 0033 03/15/20 0841  NA 137 137  K 3.3* 4.1  CL 107 104  CO2 22 19*  GLUCOSE 216* 205*  BUN 15 16  CREATININE 0.87 0.88  CALCIUM 9.3 8.9  MG 1.9  --    GFR: Estimated Creatinine Clearance: 105 mL/min (by C-G formula based on SCr of 0.88 mg/dL). Liver Function Tests: No results for input(s): AST, ALT, ALKPHOS, BILITOT, PROT, ALBUMIN in the last 168 hours. No results for input(s): LIPASE, AMYLASE in the last 168 hours. No results for input(s): AMMONIA in the last 168 hours. Coagulation Profile: Recent Labs  Lab 03/15/20 0033  INR 1.3*   Cardiac Enzymes: No results for input(s): CKTOTAL, CKMB, CKMBINDEX, TROPONINI in the last 168 hours. BNP (last 3 results) No results for input(s): PROBNP in the last 8760 hours. HbA1C: No results for input(s): HGBA1C in the last 72 hours. CBG: No results for input(s): GLUCAP in the last 168 hours. Lipid Profile: No results for input(s): CHOL, HDL, LDLCALC, TRIG, CHOLHDL, LDLDIRECT in the last 72 hours. Thyroid Function Tests: Recent Labs    03/15/20 0841  TSH 2.120   Anemia Panel: No results for input(s): VITAMINB12, FOLATE, FERRITIN, TIBC, IRON, RETICCTPCT in the last 72 hours. Urine analysis:    Component Value Date/Time   COLORURINE YELLOW 03/15/2020 0122   APPEARANCEUR CLEAR 03/15/2020 0122   LABSPEC 1.037 (H) 03/15/2020 0122   PHURINE 6.0 03/15/2020 0122   GLUCOSEU >=500 (A) 03/15/2020 0122   HGBUR LARGE (A) 03/15/2020 0122   BILIRUBINUR NEGATIVE 03/15/2020 0122   KETONESUR NEGATIVE 03/15/2020 0122   PROTEINUR NEGATIVE 03/15/2020 0122   UROBILINOGEN 2.0 (H) 01/31/2015 0055   NITRITE NEGATIVE 03/15/2020 0122   LEUKOCYTESUR NEGATIVE 03/15/2020 0122   Sepsis Labs: @LABRCNTIP (procalcitonin:4,lacticidven:4)  ) Recent Results (from the past  240 hour(s))  Blood Culture (routine x 2)     Status: None (Preliminary result)   Collection Time: 03/15/20 12:33 AM   Specimen: BLOOD  Result Value Ref Range Status   Specimen Description BLOOD RIGHT ANTECUBITAL  Final   Special Requests   Final    BOTTLES DRAWN AEROBIC AND ANAEROBIC Blood Culture adequate volume   Culture   Final    NO GROWTH < 12 HOURS Performed at [facility], [place] [address], [place], [place] [address]    Report Status PENDING  Incomplete  Blood Culture (routine x 2)     Status: None (Preliminary result)   Collection Time: 03/15/20 12:35 AM   Specimen: BLOOD RIGHT HAND  Result Value Ref Range Status   Specimen Description BLOOD RIGHT HAND  Final   Special Requests   Final    BOTTLES DRAWN AEROBIC AND ANAEROBIC Blood Culture adequate volume   Culture   Final    NO GROWTH < 12 HOURS Performed at [facility], [place] [address] [place], [place] [address]  Report Status PENDING  Incomplete  SARS Coronavirus 2 by RT PCR (hospital order, performed in [facility] hospital lab) Nasopharyngeal Nasopharyngeal Swab     Status: None   Collection Time: 03/15/20  2:12 AM   Specimen: Nasopharyngeal Swab  Result Value Ref Range Status   SARS Coronavirus 2 NEGATIVE NEGATIVE Final    Comment: (NOTE) SARS-CoV-2 target nucleic acids are NOT DETECTED.  The SARS-CoV-2 RNA is generally detectable in upper and lower respiratory specimens during the acute phase of infection. The lowest concentration of SARS-CoV-2 viral copies this assay can detect is 250 copies / mL. A negative result does not preclude SARS-CoV-2 infection and should not be used as the sole basis for treatment or other patient management decisions.  A negative result may occur with improper specimen collection / handling, submission of specimen other than nasopharyngeal swab, presence of viral mutation(s) within the areas targeted by this assay, and inadequate number of viral copies (<250 copies /  mL). A negative result must be combined with clinical observations, patient history, and epidemiological information.  Fact Sheet for Patients:   StrictlyIdeas.no  Fact Sheet for Healthcare Providers: BankingDealers.co.za  This test is not yet approved or  cleared by the [place] FDA and has been authorized for detection and/or diagnosis of SARS-CoV-2 by FDA under an Emergency Use Authorization (EUA).  This EUA will remain in effect (meaning this test can be used) for the duration of the COVID-19 declaration under Section 564(b)(1) of the Act, 21 U.S.C. section 360bbb-3(b)(1), unless the authorization is terminated or revoked sooner.  Performed at [facility], [place] [address] [place], [place] [address]          Radiology Studies: CT Angio Chest PE W and/or Wo Contrast  Result Date: 03/15/2020 CLINICAL DATA:  Shortness of breath. Elevated D-dimer. On estrogen replacement therapy. EXAM: CT ANGIOGRAPHY CHEST WITH CONTRAST TECHNIQUE: Multidetector CT imaging of the chest was performed using the standard protocol during bolus administration of intravenous contrast. Multiplanar CT image reconstructions and MIPs were obtained to evaluate the vascular anatomy. CONTRAST:  136m OMNIPAQUE IOHEXOL 350 MG/ML SOLN COMPARISON:  None. FINDINGS: Cardiovascular: Suboptimal opacification of the pulmonary arteries due to bolus timing, limiting evaluation beyond the proximal lobar branches. Within that limitation, there is no central pulmonary embolus. The size of the main pulmonary artery is enlarged, measuring 3.3 cm. Heart size is normal, with no pericardial effusion. The course and caliber of the aorta are normal. There is no atherosclerotic calcification. Opacification decreased due to pulmonary arterial phase contrast bolus timing. There is an aberrant right subclavian artery. Mediastinum/Nodes: No mediastinal, hilar or axillary  lymphadenopathy. Normal visualized thyroid. Thoracic esophageal course is normal. Lungs/Pleura: Airways are patent. No pleural effusion, lobar consolidation, pneumothorax or pulmonary infarction. Upper Abdomen: Contrast bolus timing is not optimized for evaluation of the abdominal organs. The spleen is enlarged measuring 19.7 cm in AP dimension. Musculoskeletal: No chest wall abnormality. No bony spinal canal stenosis. Review of the MIP images confirms the above findings. IMPRESSION: 1. Suboptimal opacification of the pulmonary arteries due to bolus timing, limiting evaluation beyond the proximal lobar branches. Within that limitation, there is no large central pulmonary embolus. 2. Enlarged main pulmonary artery, consistent with pulmonary hypertension. 3. Splenomegaly. Electronically Signed   By: KUlyses JarredM.D.   On: 03/15/2020 02:41   DG Chest Port 1 View  Result Date: 03/15/2020 CLINICAL DATA:  Shortness of breath EXAM: PORTABLE CHEST 1 VIEW COMPARISON:  None. FINDINGS: Moderate cardiomegaly. Both  lungs are clear. The visualized skeletal structures are unremarkable. IMPRESSION: Moderate cardiomegaly without focal airspace disease. Electronically Signed   By: Ulyses Jarred M.D.   On: 03/15/2020 00:25   Scheduled Meds: . sodium chloride   Intravenous Once  . metoprolol tartrate  25 mg Oral BID  . sodium chloride flush  3 mL Intravenous Q12H   Continuous Infusions: . cefTRIAXone (ROCEPHIN)  IV Stopped (03/15/20 0103)  . diltiazem (CARDIZEM) infusion 15 mg/hr (03/15/20 0823)  . piperacillin-tazobactam (ZOSYN)  IV       LOS: 0 days    Time spent: 77mn  PDomenic Polite MD [facility]  03/15/2020, 10:46 AM

## 2020-03-15 NOTE — Progress Notes (Signed)
Pharmacy Antibiotic Note  Hannah Khan is a 64 y.o. female admitted on 03/14/2020 with sepsis.  Pharmacy has been consulted for zosyn dosing.  Plan: Zosyn 3.375g IV q8h (4 hour infusion).  Height: 5' 7"  (170.2 cm) Weight: (!) 165.6 kg (365 lb) IBW/kg (Calculated) : 61.6  Temp (24hrs), Avg:99.6 F (37.6 C), Min:98.5 F (36.9 C), Max:100.8 F (38.2 C)  Recent Labs  Lab 03/15/20 0033 03/15/20 0212  WBC 8.7  --   CREATININE 0.87  --   LATICACIDVEN 1.8 2.2*    Estimated Creatinine Clearance: 106.4 mL/min (by C-G formula based on SCr of 0.87 mg/dL).    Allergies  Allergen Reactions  . Celecoxib Other (See Comments)    "speech slurred" with celebrex  . Shellfish Allergy Swelling and Rash    TINGLING AND SWELLING OF LIPS. LARGE WHELPS QUARTER-SIZE  . Barium-Containing Compounds Other (See Comments)    TACHYCARDIA  . Prednisone Other (See Comments)    TACHYCARDIA  . Statins Other (See Comments)    AGGRAVATED FIBROMYALGIA  . Fentanyl     Hallucinations   . Ibuprofen Other (See Comments)    UNSPECIFIED SPECIFIC REACTION >> "DUE TO PLATELETS"  . Tramadol     Hallucination   . Tylenol [Acetaminophen] Other (See Comments)    UNSPECIFIED SPECIFIC REACTION >> "DUE TO PLATELETS"     Thank you for allowing pharmacy to be a part of this patient's care.  Excell Seltzer Poteet 03/15/2020 6:14 AM

## 2020-03-15 NOTE — Progress Notes (Signed)
   03/15/20 1125  Assess: MEWS Score  Temp 98.3 F (36.8 C)  BP (!) 117/51  Pulse Rate (!) 123  ECG Heart Rate (!) 123  Resp 20  SpO2 98 %  O2 Device Room Air  Assess: MEWS Score  MEWS Temp 0  MEWS Systolic 0  MEWS Pulse 2  MEWS RR 0  MEWS LOC 0  MEWS Score 2  MEWS Score Color Yellow  Assess: if the MEWS score is Yellow or Red  Were vital signs taken at a resting state? Yes  Focused Assessment Documented focused assessment  Early Detection of Sepsis Score *See Row Information* Low  MEWS guidelines implemented *See Row Information* Yes  Treat  MEWS Interventions Administered scheduled meds/treatments  Take Vital Signs  Increase Vital Sign Frequency  Yellow: Q 2hr X 2 then Q 4hr X 2, if remains yellow, continue Q 4hrs  Escalate  MEWS: Escalate Yellow: discuss with charge nurse/RN and consider discussing with provider and RRT  Notify: Charge Nurse/RN  Name of Charge Nurse/RN Notified Abigail, RN  Date Charge Nurse/RN Notified 03/15/20  Time Charge Nurse/RN Notified [id number]

## 2020-03-15 NOTE — ED Notes (Signed)
Attending: Broadus John, MD at bedside.

## 2020-03-15 NOTE — Plan of Care (Signed)

## 2020-03-15 NOTE — Progress Notes (Signed)
PHARMACY - PHYSICIAN COMMUNICATION CRITICAL VALUE ALERT - BLOOD CULTURE IDENTIFICATION (BCID)  Hannah Khan is an 64 y.o. female who presented to [facility] on 03/14/2020 with a chief complaint of dizziness and tachycardia.  Assessment: 2 Anaerobic bottles are growing GPCs and chains - identified as streptococcus  Name of physician (or Provider) Contacted: Dr. Broadus John  Current antibiotics: Zosyn 3.375 q8hrs  Changes to prescribed antibiotics recommended:  Recommend changing to Rocephin 2 grams IV q24hrs. Recommendation accepted by provider.  Results for orders placed or performed during the hospital encounter of 03/14/20  Blood Culture ID Panel (Reflexed) (Collected: 03/15/2020 12:33 AM)  Result Value Ref Range   Enterococcus species NOT DETECTED NOT DETECTED   Listeria monocytogenes NOT DETECTED NOT DETECTED   Staphylococcus species NOT DETECTED NOT DETECTED   Staphylococcus aureus (BCID) NOT DETECTED NOT DETECTED   Streptococcus species DETECTED (A) NOT DETECTED   Streptococcus agalactiae NOT DETECTED NOT DETECTED   Streptococcus pneumoniae NOT DETECTED NOT DETECTED   Streptococcus pyogenes NOT DETECTED NOT DETECTED   Acinetobacter baumannii NOT DETECTED NOT DETECTED   Enterobacteriaceae species NOT DETECTED NOT DETECTED   Enterobacter cloacae complex NOT DETECTED NOT DETECTED   Escherichia coli NOT DETECTED NOT DETECTED   Klebsiella oxytoca NOT DETECTED NOT DETECTED   Klebsiella pneumoniae NOT DETECTED NOT DETECTED   Proteus species NOT DETECTED NOT DETECTED   Serratia marcescens NOT DETECTED NOT DETECTED   Haemophilus influenzae NOT DETECTED NOT DETECTED   Neisseria meningitidis NOT DETECTED NOT DETECTED   Pseudomonas aeruginosa NOT DETECTED NOT DETECTED   Candida albicans NOT DETECTED NOT DETECTED   Candida glabrata NOT DETECTED NOT DETECTED   Candida krusei NOT DETECTED NOT DETECTED   Candida parapsilosis NOT DETECTED NOT DETECTED   Candida tropicalis NOT DETECTED NOT  DETECTED    Alanda Slim, PharmD, FCCM Clinical Pharmacist Please see AMION for all Pharmacists' Contact Phone Numbers 03/15/2020, 3:11 PM

## 2020-03-15 NOTE — ED Notes (Signed)
1st Lactic Acid normal. 2nd to be discontinued.

## 2020-03-15 NOTE — Consult Note (Addendum)
Cardiology Consultation:   Patient ID: Hannah Khan MRN: [id number]; DOB: December 24, 1955  Admit date: 03/14/2020 Date of Consult: 03/15/2020  Primary Care Provider: Lujean Amel, MD [facility] Cardiologist: Skeet Latch, MD  [facility] Electrophysiologist: New  Patient Profile:   Hannah Khan is a 64 y.o. female with a hx of chronic diastolic heart failure, persistent atrial fibrillation, not on long-term chronic anticoagulation secondary to bleeding risk, thrombocytopenia with liver cirrhosis (due to NASH), morbid obesity with BMI of 57, intermittent uterine bleeding, diabetes mellitus and obstructive sleep apnea on CPAP who is being seen today for the evaluation of atrial fibrillation with rapid ventricular rate at the request of Dr. Broadus John.  Last seen by Kerin Ransom 02/26/2020.  Increase Bumex.  History of Present Illness:   Ms. Hannah Khan not been feeling well for past 1 week.  She has intermittent dizziness, nausea, headache with vertigo-like sensation and elevated heart rate.  Yesterday she had worse episode and came to ER for further evaluation.  She denies orthopnea, PND, chest pain or melena.  Mild right side abdominal pain.  Poor p.o. intake.  See noted mildly febrile at 100.8 temperature.  In A. fib RVR with heart rate ranging 120-180s.  Started on IV diltiazem with improved heart rate.  BNP 340.  INR 1.3/D-dimer 2.35. CT angio of chest showed no evidence of pulmonary embolism however enlarged pulmonary artery consistent with pulmonary hypertension. TSH 2.1. WBC 8.7. Lactic acid 1.8. CXr with cardiomegaly.   Past Medical History:  Diagnosis Date  . Anemia    hx of  . Arthritis    knees  . Diabetes mellitus without complication ([place])    type 2  . Fibromyalgia   . HTN (hypertension)   . Hx of colonic polyps    s/p partial colectomy  . Hyperlipidemia   . Joint pain    in knees and back spasms  . Left leg swelling    wear compression hose  . Liver cirrhosis  secondary to NASH (nonalcoholic steatohepatitis) ([place]) dx nov 2014   had enlarged spleen also   . Low blood pressure    bp varies  . Morbid obesity ([place])   . Persistent atrial fibrillation ([place])   . Pneumonia 10/2016  . PONV (postoperative nausea and vomiting)    in past none recent  . Portal hypertension ([place])   . Sleep apnea    uses cpap setting of 14  . Spleen enlarged   . 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 [facility])     Past Surgical History:  Procedure Laterality Date  . BREAST LUMPECTOMY WITH RADIOACTIVE SEED LOCALIZATION Right 08/29/2017   Procedure: RIGHT BREAST LUMPECTOMY WITH RADIOACTIVE SEEDS LOCALIZATION;  Surgeon: Erroll Luna, MD;  Location: [facility];  Service: General;  Laterality: Right;  . CESAREAN SECTION  1989  . CHOLECYSTECTOMY  20 yrs ago  . COLECTOMY  2011  . COLONOSCOPY    . COLONOSCOPY N/A 09/05/2018   Procedure: COLONOSCOPY;  Surgeon: [name], MD;  Location: [facility];  Service: Endoscopy;  Laterality: N/A;  . DILATATION & CURETTAGE/HYSTEROSCOPY WITH MYOSURE N/A 05/06/2016   Procedure: DILATATION & CURETTAGE/HYSTEROSCOPY WITH MYOSURE WITH POLYPECTOMY;  Surgeon: Janyth Pupa, DO;  Location: [facility];  Service: Gynecology;  Laterality: N/A;  . ESOPHAGOGASTRODUODENOSCOPY (EGD) WITH PROPOFOL N/A 09/04/2013   Procedure: ESOPHAGOGASTRODUODENOSCOPY (EGD) WITH PROPOFOL;  Surgeon: [name], MD;  Location: [facility];  Service: Endoscopy;  Laterality: N/A;  . KNEE ARTHROSCOPY  yrs ago   bilateral, one done x 1, one done  twice  . POLYPECTOMY  09/05/2018   Procedure: POLYPECTOMY;  Surgeon: [name], MD;  Location: [facility];  Service: Endoscopy;;     Inpatient Medications: Scheduled Meds: . sodium chloride   Intravenous Once  . metoprolol tartrate  25 mg Oral BID  . sodium chloride flush  3 mL Intravenous Q12H   Continuous Infusions: . diltiazem (CARDIZEM) infusion 15 mg/hr (03/15/20 0823)  . piperacillin-tazobactam (ZOSYN)  IV      PRN Meds: ondansetron **OR** ondansetron (ZOFRAN) IV  Allergies:    Allergies  Allergen Reactions  . Celecoxib Other (See Comments)    "speech slurred" with celebrex  . Shellfish Allergy Swelling and Rash    TINGLING AND SWELLING OF LIPS. LARGE WHELPS QUARTER-SIZE  . Barium-Containing Compounds Other (See Comments)    TACHYCARDIA  . Prednisone Other (See Comments)    TACHYCARDIA  . Statins Other (See Comments)    AGGRAVATED FIBROMYALGIA  . Fentanyl     Hallucinations   . Ibuprofen Other (See Comments)    UNSPECIFIED SPECIFIC REACTION >> "DUE TO PLATELETS"  . Tramadol     Hallucination   . Tylenol [Acetaminophen] Other (See Comments)    UNSPECIFIED SPECIFIC REACTION >> "DUE TO PLATELETS"    Social History:   Social History   Socioeconomic History  . Marital status: Married    Spouse name: Not on file  . Number of children: Not on file  . Years of education: Not on file  . Highest education level: Not on file  Occupational History  . Occupation: Merchant navy officer  Tobacco Use  . Smoking status: Former Smoker    Packs/day: 1.00    Years: 3.00    Pack years: 3.00    Types: Cigarettes    Quit date: 09/19/1974    Years since quitting: 45.5  . Smokeless tobacco: Never Used  Vaping Use  . Vaping Use: Never used  Substance and Sexual Activity  . Alcohol use: Yes    Comment: occ  . Drug use: No  . Sexual activity: Not on file  Other Topics Concern  . Not on file  Social History Narrative   Lives in [place] with her husband.  Instructor for early childhood development.     Social Determinants of Health   Financial Resource Strain:   . Difficulty of Paying Living Expenses:   Food Insecurity:   . Worried About Charity fundraiser in the Last Year:   . Arboriculturist in the Last Year:   Transportation Needs:   . Film/video editor (Medical):   Marland Kitchen Lack of Transportation (Non-Medical):   Physical Activity:   . Days of Exercise per Week:   . Minutes of  Exercise per Session:   Stress:   . Feeling of Stress :   Social Connections:   . Frequency of Communication with Friends and Family:   . Frequency of Social Gatherings with Friends and Family:   . Attends Religious Services:   . Active Member of Clubs or Organizations:   . Attends Archivist Meetings:   Marland Kitchen Marital Status:   Intimate Partner Violence:   . Fear of Current or Ex-Partner:   . Emotionally Abused:   Marland Kitchen Physically Abused:   . Sexually Abused:     Family History:   Family History  Problem Relation Age of Onset  . Heart failure Mother   . Cancer Mother   . Hyperlipidemia Mother   . Congestive Heart Failure Mother   .  Stroke Mother   . Lung cancer Father   . Cancer Father   . Cancer Brother   . Hyperlipidemia Brother   . Stroke Other      ROS:  Please see the history of present illness.  All other ROS reviewed and negative.     Physical Exam/Data:   Vitals:   03/15/20 0945 03/15/20 1000 03/15/20 1015 03/15/20 1125  BP: 108/69 108/72 135/88 (!) 117/51  Pulse: (!) 119 (!) 129 (!) 125 (!) 123  Resp: 20 19 18 20   Temp:   97.8 F (36.6 C) 98.3 F (36.8 C)  TempSrc:   Oral Oral  SpO2: 96% 96% 97% 98%  Weight:      Height:       No intake or output data in the 24 hours ending 03/15/20 1206 Last 3 Weights 03/15/2020 03/14/2020 02/26/2020  Weight (lbs) 363 lb 12.1 oz 365 lb 367 lb 3.2 oz  Weight (kg) 165 kg 165.563 kg 166.561 kg     Body mass index is 56.97 kg/m.  General: obese female in no acute distress HEENT: normal Lymph: no adenopathy Neck: no JVD Endocrine:  No thryomegaly Vascular: No carotid bruits; FA pulses 2+ bilaterally without bruits  Cardiac:  normal S1, S2; irregularly irregular tachycardic; no murmur Lungs:  clear to auscultation bilaterally, no wheezing, rhonchi or rales  Abd: soft, nontender, no hepatomegaly  Ext: Venous stasis with mild edema Musculoskeletal:  No deformities, BUE and BLE strength normal and equal Skin: warm  and dry  Neuro:  CNs 2-12 intact, no focal abnormalities noted Psych:  Normal affect   EKG:  The EKG was personally reviewed and demonstrates: Atrial fibrillation, right bundle branch block Telemetry:  Telemetry was personally reviewed and demonstrates: Atrial fibrillation at rate of 120-160s  Relevant CV Studies: Echo 02/2018 Left ventricle: The cavity size was normal. There was mild  concentric hypertrophy. Systolic function was normal. The  estimated ejection fraction was in the range of 55% to 60%.  Although no diagnostic regional wall motion abnormality was  identified, this possibility cannot be completely excluded on the  basis of this study. Due to tachycardia, there was fusion of  early and atrial contributions to ventricular filling. The study  is not technically sufficient to allow evaluation of LV diastolic  function.  - Right ventricle: Not able to assess RV function due to poor  visualization. The cavity size was dilated.  - Right atrium: The atrium was mildly dilated.   Impressions:   - Apical images were poor quality even with Definity imaging,   Laboratory Data:  High Sensitivity Troponin:   Recent Labs  Lab 03/15/20 0841  TROPONINIHS 11     Chemistry Recent Labs  Lab 03/15/20 0033 03/15/20 0841  NA 137 137  K 3.3* 4.1  CL 107 104  CO2 22 19*  GLUCOSE 216* 205*  BUN 15 16  CREATININE 0.87 0.88  CALCIUM 9.3 8.9  GFRNONAA >60 >60  GFRAA >60 >60  ANIONGAP 8 14    Hematology Recent Labs  Lab 03/15/20 0033  WBC 8.7  RBC 4.08  HGB 12.4  HCT 37.8  MCV 92.6  MCH 30.4  MCHC 32.8  RDW 14.2  PLT 32*   BNP Recent Labs  Lab 03/15/20 0033  BNP 340.2*    DDimer  Recent Labs  Lab 03/15/20 0033  DDIMER 2.35*    Radiology/Studies:  CT Angio Chest PE W and/or Wo Contrast  Result Date: 03/15/2020 CLINICAL DATA:  Shortness of  breath. Elevated D-dimer. On estrogen replacement therapy. EXAM: CT ANGIOGRAPHY CHEST WITH CONTRAST  TECHNIQUE: Multidetector CT imaging of the chest was performed using the standard protocol during bolus administration of intravenous contrast. Multiplanar CT image reconstructions and MIPs were obtained to evaluate the vascular anatomy. CONTRAST:  136m OMNIPAQUE IOHEXOL 350 MG/ML SOLN COMPARISON:  None. FINDINGS: Cardiovascular: Suboptimal opacification of the pulmonary arteries due to bolus timing, limiting evaluation beyond the proximal lobar branches. Within that limitation, there is no central pulmonary embolus. The size of the main pulmonary artery is enlarged, measuring 3.3 cm. Heart size is normal, with no pericardial effusion. The course and caliber of the aorta are normal. There is no atherosclerotic calcification. Opacification decreased due to pulmonary arterial phase contrast bolus timing. There is an aberrant right subclavian artery. Mediastinum/Nodes: No mediastinal, hilar or axillary lymphadenopathy. Normal visualized thyroid. Thoracic esophageal course is normal. Lungs/Pleura: Airways are patent. No pleural effusion, lobar consolidation, pneumothorax or pulmonary infarction. Upper Abdomen: Contrast bolus timing is not optimized for evaluation of the abdominal organs. The spleen is enlarged measuring 19.7 cm in AP dimension. Musculoskeletal: No chest wall abnormality. No bony spinal canal stenosis. Review of the MIP images confirms the above findings. IMPRESSION: 1. Suboptimal opacification of the pulmonary arteries due to bolus timing, limiting evaluation beyond the proximal lobar branches. Within that limitation, there is no large central pulmonary embolus. 2. Enlarged main pulmonary artery, consistent with pulmonary hypertension. 3. Splenomegaly. Electronically Signed   By: KUlyses JarredM.D.   On: 03/15/2020 02:41   DG Chest Port 1 View  Result Date: 03/15/2020 CLINICAL DATA:  Shortness of breath EXAM: PORTABLE CHEST 1 VIEW COMPARISON:  None. FINDINGS: Moderate cardiomegaly. Both lungs are  clear. The visualized skeletal structures are unremarkable. IMPRESSION: Moderate cardiomegaly without focal airspace disease. Electronically Signed   By: KUlyses JarredM.D.   On: 03/15/2020 00:25   Assessment and Plan:   1.  Atrial fibrillation with rapid ventricular rate Patient has history of rate control persistent atrial fibrillation.  Reports 1 week history of intermittent dizziness, tachycardia, headache with room spinning.  She was noted in atrial fibrillation with rapid ventricular rate.  Started on IV diltiazem with improved heart rate.  She does have history of A. fib RVR December 2019 in setting of possible viral gastroenteritis. -Home propranolol changed to metoprolol tartrate 25 mg twice daily -Seems at baseline her heart rate sustains 90-100s -Previously on digoxin for better heart rate but stopped it secondary to headache -CHA2DS2-VASc score of 3 for sex, diabetes and CHF -Not on chronic anticoagulation secondary to bleeding risk - Avoid amiodarone in setting of liver disease and would not be good choice with rate control strategy  2.  Chronic diastolic heart failure -Mild lower extremity edema.  She has been struggled with volume overload secondary to unable to take diuretics at work.  Takes Bumex and spironolactone at home.   3.  Liver cirrhosis/[place]/chronic thrombocytopenia -Per primary team  4.  Obstructive sleep apnea -On CPAP -Reports compliance  5.  Diabetes mellitus -Primary team  6. ? Sepsis - Per primary team     For questions or updates, please contact CLindenPlease consult [email or web address] for contact info under    Signed, BLeanor Kail PA  03/15/2020 12:06 PM    I have seen, examined the patient, and reviewed the above assessment and plan.  Changes to above are made where necessary.  On exam, morbidly obese,  IRRR.  The patient has multiple chronic comoribidites which appear  primarily related to morbid obesity.  Unfortunately our options  are limited.  She is not a candidate for anticoagulation or EP procedures.  Rate control is her option for afib. Continue IV diltiazem today and then convert to diltiazem CD 333m daily tomorrow.  General cardiology to follow  Co Sign: JThompson Grayer MD 03/15/2020 1:32 PM

## 2020-03-15 NOTE — ED Notes (Signed)
Pt hr in 160s and bp 97/72. MD Kathrynn Humble made aware. Per MD to go up to 10 cardizem

## 2020-03-15 NOTE — ED Notes (Signed)
Patient placed on pure wick °

## 2020-03-15 NOTE — H&P (Signed)
History and Physical    Hannah Khan YTK:[id number] DOB: 07-Aug-1956 DOA: 03/14/2020  PCP: Lujean Amel, MD  Patient coming from: home  I have personally briefly reviewed patient's old medical records in [place]  Chief Complaint: c/o palpitations  HPI: Hannah Khan is a 64 y.o. female with medical history significant of   with a hx of morbid obesity with a BMI of 57, cirrhosis with thrombocytopenia, chronic atrial fibrillation not on anticoagulation because of bleeding risk, non-insulin-dependent diabetes, and chronic diastolic heart failure,interim hx of dysfunction uterine bleeding, urinary incontinence. Patient presents to ed by EMS with dizziness and tachycardia. She notes that her symptoms started around 2 weeks ago but were intermittent however over the last 2 days  Her symptoms have become more persistent at severe. She denies any episodes of syncope or fall, and notes no  ,chest , n/v/f/c/d //dysuria/ uri signs and symptoms/cough associated with her symptoms. Although she does note she has had poor po intake as of late and notes mild abdominal discomfort on right side of abdomen and right flank. On further ros patient does mention urinary incontinence as well as dysfunction uterine bleeding and intermittent HA.  ED Course:  Temp 100.8, hr 120-200 afib rvr, bp 142/110, sat 96% on 2L   EKG afib LAD no hyperacute st-twave changes TX  ivf 150cc, vagal maneuvers with dip in hr to 100-140  Not sustained, patient then started on cardizem drip  currenlty on 23m/min with HR still in 150's CTX iv due to SIRS  LR x 2L CT chest: IMPRESSION: 1. Suboptimal opacification of the pulmonary arteries due to bolus timing, limiting evaluation beyond the proximal lobar branches. Within that limitation, there is no large central pulmonary embolus. 2. Enlarged main pulmonary artery, consistent with pulmonary hypertension. 3. Splenomegaly. Negative for pe UA:+bacteria+ blood  Labs:   Wbc 8.7, hgb 12.4 down from 15.5, mcv 92.6 plt 32 Inr 1.3/d-dimer 2.35 NA 137, K3.3, glu 216 BNP340 Lactic acid 1.8-2.2 Review of Systems: As per HPI otherwise 10 point review of systems negative.   Past Medical History:  Diagnosis Date  . Anemia    hx of  . Arthritis    knees  . Diabetes mellitus without complication ([facility]    type 2  . Fibromyalgia   . HTN (hypertension)   . Hx of colonic polyps    s/p partial colectomy  . Hyperlipidemia   . Joint pain    in knees and back spasms  . Left leg swelling    wear compression hose  . Liver cirrhosis secondary to NASH (nonalcoholic steatohepatitis) ([facility] [place] nov 2014   had enlarged spleen also   . Low blood pressure    bp varies  . Morbid obesity ([facility]   . Persistent atrial fibrillation ([facility]   . Pneumonia 10/2016  . PONV (postoperative nausea and vomiting)    in past none recent  . Portal hypertension ([facility]   . Sleep apnea    uses cpap setting of 14  . Spleen enlarged   . 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 ([facility]     Past Surgical History:  Procedure Laterality Date  . BREAST LUMPECTOMY WITH RADIOACTIVE SEED LOCALIZATION Right 08/29/2017   Procedure: RIGHT BREAST LUMPECTOMY WITH RADIOACTIVE SEEDS LOCALIZATION;  Surgeon: CErroll Luna MD;  [place] [facility]  Service: General;  Laterality: Right;  . CESAREAN SECTION  1989  . CHOLECYSTECTOMY  20 yrs ago  . COLECTOMY  2011  . COLONOSCOPY    . COLONOSCOPY  N/A 09/05/2018   Procedure: COLONOSCOPY;  Surgeon: [name], MD;  Location: [facility];  Service: Endoscopy;  Laterality: N/A;  . DILATATION & CURETTAGE/HYSTEROSCOPY WITH MYOSURE N/A 05/06/2016   Procedure: DILATATION & CURETTAGE/HYSTEROSCOPY WITH MYOSURE WITH POLYPECTOMY;  Surgeon: Janyth Pupa, DO;  Location: [facility];  Service: Gynecology;  Laterality: N/A;  . ESOPHAGOGASTRODUODENOSCOPY (EGD) WITH PROPOFOL N/A 09/04/2013   Procedure: ESOPHAGOGASTRODUODENOSCOPY (EGD) WITH PROPOFOL;  Surgeon: [name],  MD;  Location: [facility];  Service: Endoscopy;  Laterality: N/A;  . KNEE ARTHROSCOPY  yrs ago   bilateral, one done x 1, one done twice  . POLYPECTOMY  09/05/2018   Procedure: POLYPECTOMY;  Surgeon: [name], MD;  Location: [facility];  Service: Endoscopy;;     reports that she quit smoking about 45 years ago. Her smoking use included cigarettes. She has a 3.00 pack-year smoking history. She has never used smokeless tobacco. She reports current alcohol use. She reports that she does not use drugs.  Allergies  Allergen Reactions  . Celecoxib Other (See Comments)    "speech slurred" with celebrex  . Shellfish Allergy Swelling and Rash    TINGLING AND SWELLING OF LIPS. LARGE WHELPS QUARTER-SIZE  . Barium-Containing Compounds Other (See Comments)    TACHYCARDIA  . Prednisone Other (See Comments)    TACHYCARDIA  . Statins Other (See Comments)    AGGRAVATED FIBROMYALGIA  . Fentanyl     Hallucinations   . Ibuprofen Other (See Comments)    UNSPECIFIED SPECIFIC REACTION >> "DUE TO PLATELETS"  . Tramadol     Hallucination   . Tylenol [Acetaminophen] Other (See Comments)    UNSPECIFIED SPECIFIC REACTION >> "DUE TO PLATELETS"    Family History  Problem Relation Age of Onset  . Heart failure Mother   . Cancer Mother   . Hyperlipidemia Mother   . Congestive Heart Failure Mother   . Stroke Mother   . Lung cancer Father   . Cancer Father   . Cancer Brother   . Hyperlipidemia Brother   . Stroke Other     Prior to Admission medications   Medication Sig Start Date End Date Taking? Authorizing Provider  albuterol (PROVENTIL HFA;VENTOLIN HFA) 108 (90 Base) MCG/ACT inhaler Inhale 2 puffs into the lungs every 6 (six) hours as needed for wheezing or shortness of breath.  11/01/18  Yes [provider]  bumetanide (BUMEX) 2 MG tablet Take 1 tablet (2 mg total) by mouth 2 (two) times daily. 02/26/20  Yes Kilroy, Luke K, PA-C  diltiazem (CARDIZEM CD) 300 MG 24 hr capsule  TAKE 1 CAPSULE BY MOUTH EVERY DAY Patient taking differently: Take 300 mg by mouth daily.  08/14/19  Yes Skeet Latch, MD  JARDIANCE 25 MG TABS tablet Take 25 mg by mouth daily after breakfast.  01/28/17  Yes [provider]  megestrol (MEGACE) 20 MG tablet Take 20 mg by mouth daily.   Yes [provider]  mirabegron ER (MYRBETRIQ) 50 MG TB24 tablet Take 50 mg by mouth daily.   Yes [provider]  propranolol (INDERAL) 10 MG tablet TAKE 1 TABLET BY MOUTH TWICE A DAY Patient taking differently: Take 10 mg by mouth 2 (two) times daily.  01/29/20  Yes Skeet Latch, MD  spironolactone (ALDACTONE) 25 MG tablet TAKE 1 TABLET (25 MG TOTAL) BY MOUTH DAILY. SCHEDULE OV FOR FURTHER REFILLS. 12/27/19  Yes Skeet Latch, MD  KLOR-CON M20 20 MEQ tablet Take 1 tablet (20 mEq total) by mouth daily. Patient not taking: Reported  on 03/15/2020 03/27/18   Lonn Georgia, PA-C    Physical Exam: Vitals:   03/15/20 0430 03/15/20 0443 03/15/20 0452 03/15/20 0516  BP: (!) 103/50 97/72 112/72 129/62  Pulse: (!) 165 (!) 152 89 (!) 171  Resp:   19 17  Temp:    98.5 F (36.9 C)  TempSrc:    Oral  SpO2: 96% 95% 95% 95%  Weight:      Height:         Vitals:   03/15/20 0430 03/15/20 0443 03/15/20 0452 03/15/20 0516  BP: (!) 103/50 97/72 112/72 129/62  Pulse: (!) 165 (!) 152 89 (!) 171  Resp:   19 17  Temp:    98.5 F (36.9 C)  TempSrc:    Oral  SpO2: 96% 95% 95% 95%  Weight:      Height:      Constitutional: NAD, calm, speaking in full sentences ,+DOE Eyes: PERRL, lids and conjunctivae normal ENMT: Mucous membranes are moist. Posterior pharynx clear of any exudate or lesions.Normal dentition.  Neck: normal, supple, no masses, no thyromegaly Respiratory: clear to auscultation bilaterally, no wheezing, no crackles. Mild increase respiratory effort. No accessory muscle use.  Cardiovascular: Regular rate and rhythm, no murmurs / rubs / gallops. No extremity edema. 2+  pedal pulses. No carotid bruits.  Abdomen: no tenderness, no masses palpated. No hepatosplenomegaly. Bowel sounds positive.  Musculoskeletal: no clubbing / cyanosis. No joint deformity upper and lower extremities. Good ROM, no contractures. Normal muscle tone.  Skin: no rashes, lesions, ulcers. No induration Neurologic: CN 2-12 grossly intact. Sensation intact, DTR normal. Strength 5/5 in all 4.  Psychiatric: Normal judgment and insight. Alert and oriented x 3. Normal mood.    Labs on Admission: I have personally reviewed following labs and imaging studies  CBC: Recent Labs  Lab 03/15/20 0033  WBC 8.7  NEUTROABS 7.7  HGB 12.4  HCT 37.8  MCV 92.6  PLT 32*   Basic Metabolic Panel: Recent Labs  Lab 03/15/20 0033  NA 137  K 3.3*  CL 107  CO2 22  GLUCOSE 216*  BUN 15  CREATININE 0.87  CALCIUM 9.3  MG 1.9   GFR: Estimated Creatinine Clearance: 106.4 mL/min (by C-G formula based on SCr of 0.87 mg/dL). Liver Function Tests: No results for input(s): AST, ALT, ALKPHOS, BILITOT, PROT, ALBUMIN in the last 168 hours. No results for input(s): LIPASE, AMYLASE in the last 168 hours. No results for input(s): AMMONIA in the last 168 hours. Coagulation Profile: Recent Labs  Lab 03/15/20 0033  INR 1.3*   Cardiac Enzymes: No results for input(s): CKTOTAL, CKMB, CKMBINDEX, TROPONINI in the last 168 hours. BNP (last 3 results) No results for input(s): PROBNP in the last 8760 hours. HbA1C: No results for input(s): HGBA1C in the last 72 hours. CBG: No results for input(s): GLUCAP in the last 168 hours. Lipid Profile: No results for input(s): CHOL, HDL, LDLCALC, TRIG, CHOLHDL, LDLDIRECT in the last 72 hours. Thyroid Function Tests: No results for input(s): TSH, T4TOTAL, FREET4, T3FREE, THYROIDAB in the last 72 hours. Anemia Panel: No results for input(s): VITAMINB12, FOLATE, FERRITIN, TIBC, IRON, RETICCTPCT in the last 72 hours. Urine analysis:    Component Value Date/Time    COLORURINE YELLOW 03/15/2020 0122   APPEARANCEUR CLEAR 03/15/2020 0122   LABSPEC 1.037 (H) 03/15/2020 0122   PHURINE 6.0 03/15/2020 0122   GLUCOSEU >=500 (A) 03/15/2020 0122   HGBUR LARGE (A) 03/15/2020 0122   BILIRUBINUR NEGATIVE 03/15/2020 0122   Benjamin Stain  NEGATIVE 03/15/2020 0122   PROTEINUR NEGATIVE 03/15/2020 0122   UROBILINOGEN 2.0 (H) 01/31/2015 0055   NITRITE NEGATIVE 03/15/2020 0122   LEUKOCYTESUR NEGATIVE 03/15/2020 0122    Radiological Exams on Admission: CT Angio Chest PE W and/or Wo Contrast  Result Date: 03/15/2020 CLINICAL DATA:  Shortness of breath. Elevated D-dimer. On estrogen replacement therapy. EXAM: CT ANGIOGRAPHY CHEST WITH CONTRAST TECHNIQUE: Multidetector CT imaging of the chest was performed using the standard protocol during bolus administration of intravenous contrast. Multiplanar CT image reconstructions and MIPs were obtained to evaluate the vascular anatomy. CONTRAST:  126m OMNIPAQUE IOHEXOL 350 MG/ML SOLN COMPARISON:  None. FINDINGS: Cardiovascular: Suboptimal opacification of the pulmonary arteries due to bolus timing, limiting evaluation beyond the proximal lobar branches. Within that limitation, there is no central pulmonary embolus. The size of the main pulmonary artery is enlarged, measuring 3.3 cm. Heart size is normal, with no pericardial effusion. The course and caliber of the aorta are normal. There is no atherosclerotic calcification. Opacification decreased due to pulmonary arterial phase contrast bolus timing. There is an aberrant right subclavian artery. Mediastinum/Nodes: No mediastinal, hilar or axillary lymphadenopathy. Normal visualized thyroid. Thoracic esophageal course is normal. Lungs/Pleura: Airways are patent. No pleural effusion, lobar consolidation, pneumothorax or pulmonary infarction. Upper Abdomen: Contrast bolus timing is not optimized for evaluation of the abdominal organs. The spleen is enlarged measuring 19.7 cm in AP dimension.  Musculoskeletal: No chest wall abnormality. No bony spinal canal stenosis. Review of the MIP images confirms the above findings. IMPRESSION: 1. Suboptimal opacification of the pulmonary arteries due to bolus timing, limiting evaluation beyond the proximal lobar branches. Within that limitation, there is no large central pulmonary embolus. 2. Enlarged main pulmonary artery, consistent with pulmonary hypertension. 3. Splenomegaly. Electronically Signed   By: KUlyses JarredM.D.   On: 03/15/2020 02:41   DG Chest Port 1 View  Result Date: 03/15/2020 CLINICAL DATA:  Shortness of breath EXAM: PORTABLE CHEST 1 VIEW COMPARISON:  None. FINDINGS: Moderate cardiomegaly. Both lungs are clear. The visualized skeletal structures are unremarkable. IMPRESSION: Moderate cardiomegaly without focal airspace disease. Electronically Signed   By: KUlyses JarredM.D.   On: 03/15/2020 00:25    EKG: Independently reviewed. As noted above  Assessment/Plan Chronic Afib with RVR  - possible due to underlying occult infection /sepsis/sirs -continue diltiazem drip , if max dose w/o improvement will switch to amiodarone -start oral metoprolol as bp allows  -not on anticoagulation due risk of bleeding  - please call cardiology consult in am  - most recent echo 6/21  Sepsis +fever, tachycardia, increasing lactate  -s/p goal directed ivfs in ed  - broad spectrum abx deescalate as able  - source unknown  Possible gi vs gu  -CT abd/pelvis pending -f/u on blood cultures     Cirrhosis due to nash  -resume home regimen  Thrombocytopenia  -plt32  -due to liver disease  -monitor labs  -type and screen  -transfuse due to concern for sepsis with hx of  DUB and plt <50  non-insulin-dependent diabetes -place on is/fs   chronic diastolic heart failure -no acute exacerbation  -resume diuretic as bp tolerates  DUB -continue megace   OAB -continue on home regimen   OSA  -cpap as able   ,FEN Hypokalemia  Replete  prn     DVT prophylaxis: scd Code Status: full Family Communication:n/a Disposition Plan: 3-5 days Consults called: please call cardiology in a  Admission status: sdu   SClance BollMD [facility]  If 7PM-7AM, please contact night-coverage [email or web address] Password [email or web address]  03/15/2020, 5:20 AM

## 2020-03-16 ENCOUNTER — Inpatient Hospital Stay (HOSPITAL_COMMUNITY): Payer: BC Managed Care – PPO

## 2020-03-16 DIAGNOSIS — R7881 Bacteremia: Secondary | ICD-10-CM | POA: Diagnosis present

## 2020-03-16 DIAGNOSIS — I4819 Other persistent atrial fibrillation: Secondary | ICD-10-CM

## 2020-03-16 DIAGNOSIS — I872 Venous insufficiency (chronic) (peripheral): Secondary | ICD-10-CM | POA: Diagnosis present

## 2020-03-16 LAB — COMPREHENSIVE METABOLIC PANEL
ALT: 33 U/L (ref 0–44)
AST: 62 U/L — ABNORMAL HIGH (ref 15–41)
Albumin: 2.7 g/dL — ABNORMAL LOW (ref 3.5–5.0)
Alkaline Phosphatase: 51 U/L (ref 38–126)
Anion gap: 9 (ref 5–15)
BUN: 21 mg/dL (ref 8–23)
CO2: 21 mmol/L — ABNORMAL LOW (ref 22–32)
Calcium: 8.6 mg/dL — ABNORMAL LOW (ref 8.9–10.3)
Chloride: 106 mmol/L (ref 98–111)
Creatinine, Ser: 0.83 mg/dL (ref 0.44–1.00)
GFR calc Af Amer: >60 mL/min (ref >60)
GFR calc non Af Amer: >60 mL/min (ref >60)
Glucose, Bld: 179 mg/dL — ABNORMAL HIGH (ref 70–99)
Potassium: 3.6 mmol/L (ref 3.5–5.1)
Sodium: 136 mmol/L (ref 135–145)
Total Bilirubin: 6.1 mg/dL — ABNORMAL HIGH (ref 0.3–1.2)
Total Protein: 5.6 g/dL — ABNORMAL LOW (ref 6.5–8.1)

## 2020-03-16 LAB — CBC
HCT: 33.7 % — ABNORMAL LOW (ref 36.0–46.0)
Hemoglobin: 11.1 g/dL — ABNORMAL LOW (ref 12.0–15.0)
MCH: 30.6 pg (ref 26.0–34.0)
MCHC: 32.9 g/dL (ref 30.0–36.0)
MCV: 92.8 fL (ref 80.0–100.0)
Platelets: 31 10*3/uL — ABNORMAL LOW (ref 150–400)
RBC: 3.63 MIL/uL — ABNORMAL LOW (ref 3.87–5.11)
RDW: 14.9 % (ref 11.5–15.5)
WBC: 5.6 10*3/uL (ref 4.0–10.5)
nRBC: 0 % (ref 0.0–0.2)

## 2020-03-16 LAB — D-DIMER, QUANTITATIVE: D-Dimer, Quant: 2.64 ug/mL-FEU — ABNORMAL HIGH (ref 0.00–0.50)

## 2020-03-16 LAB — GLUCOSE, CAPILLARY: Glucose-Capillary: 156 mg/dL — ABNORMAL HIGH (ref 70–99)

## 2020-03-16 LAB — URINE CULTURE

## 2020-03-16 LAB — PROTIME-INR
INR: 1.4 — ABNORMAL HIGH (ref 0.8–1.2)
Prothrombin Time: 16.3 seconds — ABNORMAL HIGH (ref 11.4–15.2)

## 2020-03-16 LAB — PROCALCITONIN: Procalcitonin: 0.55 ng/mL

## 2020-03-16 LAB — APTT: aPTT: 33 seconds (ref 24–36)

## 2020-03-16 MED ORDER — TRAMADOL HCL 50 MG PO TABS
50.0000 mg | ORAL_TABLET | Freq: Once | ORAL | Status: AC
  Administered 2020-03-16: 50 mg via ORAL
  Filled 2020-03-16: qty 1

## 2020-03-16 MED ORDER — FUROSEMIDE 10 MG/ML IJ SOLN
40.0000 mg | Freq: Once | INTRAMUSCULAR | Status: AC
  Administered 2020-03-16: 40 mg via INTRAVENOUS
  Filled 2020-03-16: qty 4

## 2020-03-16 MED ORDER — METOPROLOL TARTRATE 50 MG PO TABS
50.0000 mg | ORAL_TABLET | Freq: Two times a day (BID) | ORAL | Status: DC
  Administered 2020-03-16 – 2020-03-18 (×5): 50 mg via ORAL
  Filled 2020-03-16 (×5): qty 1

## 2020-03-16 MED ORDER — DILTIAZEM HCL 60 MG PO TABS
90.0000 mg | ORAL_TABLET | Freq: Four times a day (QID) | ORAL | Status: DC
  Administered 2020-03-16 – 2020-03-17 (×4): 90 mg via ORAL
  Filled 2020-03-16 (×4): qty 2

## 2020-03-16 MED ORDER — IOHEXOL 300 MG/ML  SOLN
100.0000 mL | Freq: Once | INTRAMUSCULAR | Status: AC | PRN
  Administered 2020-03-16: 100 mL via INTRAVENOUS

## 2020-03-16 MED ORDER — TRAMADOL HCL 50 MG PO TABS
50.0000 mg | ORAL_TABLET | Freq: Four times a day (QID) | ORAL | Status: DC | PRN
  Administered 2020-03-16 – 2020-03-17 (×2): 50 mg via ORAL
  Filled 2020-03-16 (×2): qty 1

## 2020-03-16 MED ORDER — IOHEXOL 9 MG/ML PO SOLN
ORAL | Status: AC
  Administered 2020-03-16: 500 mL
  Filled 2020-03-16: qty 500

## 2020-03-16 NOTE — Progress Notes (Signed)
Progress Note  Patient Name: Hannah Khan Date of Encounter: 03/16/2020  [facility] Cardiologist: Skeet Latch, MD   Subjective   No CP; mild dyspnea  Inpatient Medications    Scheduled Meds: . sodium chloride   Intravenous Once  . metoprolol tartrate  25 mg Oral BID  . sodium chloride flush  3 mL Intravenous Q12H   Continuous Infusions: . cefTRIAXone (ROCEPHIN)  IV    . diltiazem (CARDIZEM) infusion 15 mg/hr (03/16/20 0347)   PRN Meds: ondansetron **OR** ondansetron (ZOFRAN) IV   Vital Signs    Vitals:   03/16/20 0038 03/16/20 0320 03/16/20 0420 03/16/20 0520  BP: (!) 114/53 127/60 127/65 (!) 115/55  Pulse: (!) 106 (!) 105 (!) 108 (!) 116  Resp: 15   20  Temp: 98.3 F (36.8 C)   97.7 F (36.5 C)  TempSrc: Oral     SpO2: 96% 95% 95% 95%  Weight:    (!) 172.5 kg  Height:        Intake/Output Summary (Last 24 hours) at 03/16/2020 0807 Last data filed at 03/16/2020 0600 Gross per 24 hour  Intake 724.95 ml  Output 1900 ml  Net -1175.05 ml   Last 3 Weights 03/16/2020 03/15/2020 03/14/2020  Weight (lbs) 380 lb 4.8 oz 363 lb 12.1 oz 365 lb  Weight (kg) 172.503 kg 165 kg 165.563 kg      Telemetry    Atrial fibrillation, rate upper normal to mildly elevated- Personally Reviewed  Physical Exam   GEN: No acute distress.  Morbidly obese Neck: No JVD Cardiac: irregular Respiratory: Clear to auscultation bilaterally. GI: Soft, nontender, non-distended  MS: trace edema; chronic skin changes Neuro:  Nonfocal  Psych: Normal affect   Labs    High Sensitivity Troponin:   Recent Labs  Lab 03/15/20 0841  TROPONINIHS 11      Chemistry Recent Labs  Lab 03/15/20 0033 03/15/20 0841 03/16/20 0219  NA 137 137 136  K 3.3* 4.1 3.6  CL 107 104 106  CO2 22 19* 21*  GLUCOSE 216* 205* 179*  BUN 15 16 21   CREATININE 0.87 0.88 0.83  CALCIUM 9.3 8.9 8.6*  PROT  --   --  5.6*  ALBUMIN  --   --  2.7*  AST  --   --  62*  ALT  --   --  33  ALKPHOS  --    --  51  BILITOT  --   --  6.1*  GFRNONAA >60 >60 >60  GFRAA >60 >60 >60  ANIONGAP 8 14 9      Hematology Recent Labs  Lab 03/15/20 0033 03/16/20 0219  WBC 8.7 5.6  RBC 4.08 3.63*  HGB 12.4 11.1*  HCT 37.8 33.7*  MCV 92.6 92.8  MCH 30.4 30.6  MCHC 32.8 32.9  RDW 14.2 14.9  PLT 32* 31*    BNP Recent Labs  Lab 03/15/20 0033  BNP 340.2*     DDimer  Recent Labs  Lab 03/15/20 0033 03/16/20 0219  DDIMER 2.35* 2.64*     Radiology    CT Angio Chest PE W and/or Wo Contrast  Result Date: 03/15/2020 CLINICAL DATA:  Shortness of breath. Elevated D-dimer. On estrogen replacement therapy. EXAM: CT ANGIOGRAPHY CHEST WITH CONTRAST TECHNIQUE: Multidetector CT imaging of the chest was performed using the standard protocol during bolus administration of intravenous contrast. Multiplanar CT image reconstructions and MIPs were obtained to evaluate the vascular anatomy. CONTRAST:  12m OMNIPAQUE IOHEXOL 350 MG/ML SOLN COMPARISON:  None. FINDINGS: Cardiovascular: Suboptimal opacification of the pulmonary arteries due to bolus timing, limiting evaluation beyond the proximal lobar branches. Within that limitation, there is no central pulmonary embolus. The size of the main pulmonary artery is enlarged, measuring 3.3 cm. Heart size is normal, with no pericardial effusion. The course and caliber of the aorta are normal. There is no atherosclerotic calcification. Opacification decreased due to pulmonary arterial phase contrast bolus timing. There is an aberrant right subclavian artery. Mediastinum/Nodes: No mediastinal, hilar or axillary lymphadenopathy. Normal visualized thyroid. Thoracic esophageal course is normal. Lungs/Pleura: Airways are patent. No pleural effusion, lobar consolidation, pneumothorax or pulmonary infarction. Upper Abdomen: Contrast bolus timing is not optimized for evaluation of the abdominal organs. The spleen is enlarged measuring 19.7 cm in AP dimension. Musculoskeletal: No  chest wall abnormality. No bony spinal canal stenosis. Review of the MIP images confirms the above findings. IMPRESSION: 1. Suboptimal opacification of the pulmonary arteries due to bolus timing, limiting evaluation beyond the proximal lobar branches. Within that limitation, there is no large central pulmonary embolus. 2. Enlarged main pulmonary artery, consistent with pulmonary hypertension. 3. Splenomegaly. Electronically Signed   By: Ulyses Jarred M.D.   On: 03/15/2020 02:41   DG CHEST PORT 1 VIEW  Result Date: 03/16/2020 CLINICAL DATA:  Chest pain. EXAM: PORTABLE CHEST 1 VIEW COMPARISON:  Radiograph and CT a ester day. FINDINGS: Stable cardiomegaly. No acute airspace disease. No pleural fluid, pulmonary edema, or pneumothorax. Stable osseous structures. IMPRESSION: Stable cardiomegaly without acute abnormality. No change from imaging yesterday. Electronically Signed   By: Keith Rake M.D.   On: 03/16/2020 01:24   DG Chest Port 1 View  Result Date: 03/15/2020 CLINICAL DATA:  Shortness of breath EXAM: PORTABLE CHEST 1 VIEW COMPARISON:  None. FINDINGS: Moderate cardiomegaly. Both lungs are clear. The visualized skeletal structures are unremarkable. IMPRESSION: Moderate cardiomegaly without focal airspace disease. Electronically Signed   By: Ulyses Jarred M.D.   On: 03/15/2020 00:25    Patient Profile     64 y.o. female with past medical history of chronic diastolic congestive heart failure, persistent atrial fibrillation not on anticoagulation given history of thrombocytopenia with liver cirrhosis and uterine bleeding, diabetes mellitus, obstructive sleep apnea we are evaluating for atrial fibrillation with rapid ventricular response.  Last echocardiogram June 2019 showed normal LV function and mild right side enlargement.  Assessment & Plan    1 persistent atrial fibrillation-heart rate remains high normal to mildly elevated.  Will change Cardizem to 90 mg every 6 hours.  Transition to CD  once dose is stable.  Increase metoprolol to 50 mg twice daily.  Patient is not on anticoagulation given history of cirrhosis with thrombocytopenia and uterine bleeding.  2 bacteremia/sepsis-blood cultures are positive for gram-positive chains in pairs.  Antibiotics per primary care.  3 chronic diastolic congestive heart failure-diuretics are on hold.  Will need to be resumed at discharge.  4 cirrhosis/NASH/chronic thrombocytopenia-as above patient is not a candidate for anticoagulation.  5 morbid obesity  For questions or updates, please contact [place] Please consult [email or web address] for contact info under        Signed, Kirk Ruths, MD  03/16/2020, 8:07 AM

## 2020-03-16 NOTE — Consult Note (Signed)
[facility]    Date of Admission:  03/14/2020   Total days of antibiotics 2        Day 1 ceftriaxone           Reason for Consult: Streptococcal bacteremia    Referring Provider: Dr. Domenic Polite  Assessment: She has streptococcal bacteremia, most likely from a skin source.  She is improving on ceftriaxone.  If she continues to improve I will convert her to oral therapy tomorrow.  Plan: 1. Continue ceftriaxone for now  Principal Problem:   Streptococcal bacteremia Active Problems:   Obstructive sleep apnea   Morbid obesity-   Acute on chronic combined systolic and diastolic CHF (congestive heart failure) (HCC)   Type 2 diabetes mellitus without complication, without long-term current use of insulin (HCC)   Liver cirrhosis secondary to NASH (nonalcoholic steatohepatitis) (HCC)   Hyperlipidemia   A-fib (HCC)   Chronic venous stasis dermatitis of both lower extremities   Scheduled Meds: . sodium chloride   Intravenous Once  . diltiazem  90 mg Oral Q6H  . metoprolol tartrate  50 mg Oral BID  . sodium chloride flush  3 mL Intravenous Q12H   Continuous Infusions: . cefTRIAXone (ROCEPHIN)  IV     PRN Meds:.ondansetron **OR** ondansetron (ZOFRAN) IV  HPI: Hannah Khan is a 65 y.o. female who had sudden onset of malaise, nausea and dizziness leading to admission 2 days ago.  She had low-grade fever.  On empiric antibiotic therapy after cultures were obtained admission blood cultures have grown a strep species.  She is now afebrile and feeling better.   Review of Systems: Review of Systems  Constitutional: Positive for chills, fever and malaise/fatigue. Negative for diaphoresis.  HENT: Negative for congestion and sore throat.   Respiratory: Negative for cough, sputum production and shortness of breath.   Cardiovascular: Negative for chest pain.  Gastrointestinal: Positive for nausea. Negative for abdominal pain, diarrhea and vomiting.   Genitourinary: Negative for dysuria.  Musculoskeletal: Negative for back pain, joint pain and myalgias.  Skin: Negative for rash.  Neurological: Positive for dizziness. Negative for headaches.    Past Medical History:  Diagnosis Date  . Anemia    hx of  . Arthritis    knees  . Diabetes mellitus without complication ([place])    type 2  . Fibromyalgia   . HTN (hypertension)   . Hx of colonic polyps    s/p partial colectomy  . Hyperlipidemia   . Joint pain    in knees and back spasms  . Left leg swelling    wear compression hose  . Liver cirrhosis secondary to NASH (nonalcoholic steatohepatitis) ([place]) dx nov 2014   had enlarged spleen also   . Low blood pressure    bp varies  . Morbid obesity ([place])   . Persistent atrial fibrillation ([place])   . Pneumonia 10/2016  . PONV (postoperative nausea and vomiting)    in past none recent  . Portal hypertension ([place])   . Sleep apnea    uses cpap setting of 14  . Spleen enlarged   . 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 [facility])     Social History   Tobacco Use  . Smoking status: Former Smoker    Packs/day: 1.00    Years: 3.00    Pack years: 3.00    Types: Cigarettes    Quit date: 09/19/1974    Years since quitting: 45.5  . Smokeless  tobacco: Never Used  Vaping Use  . Vaping Use: Never used  Substance Use Topics  . Alcohol use: Yes    Comment: occ  . Drug use: No    Family History  Problem Relation Age of Onset  . Heart failure Mother   . Cancer Mother   . Hyperlipidemia Mother   . Congestive Heart Failure Mother   . Stroke Mother   . Lung cancer Father   . Cancer Father   . Cancer Brother   . Hyperlipidemia Brother   . Stroke Other    Allergies  Allergen Reactions  . Celecoxib Other (See Comments)    "speech slurred" with celebrex  . Shellfish Allergy Swelling and Rash    TINGLING AND SWELLING OF LIPS. LARGE WHELPS QUARTER-SIZE  . Barium-Containing Compounds Other (See Comments)    TACHYCARDIA  . Prednisone  Other (See Comments)    TACHYCARDIA  . Statins Other (See Comments)    AGGRAVATED FIBROMYALGIA  . Fentanyl     Hallucinations   . Ibuprofen Other (See Comments)    UNSPECIFIED SPECIFIC REACTION >> "DUE TO PLATELETS"  . Tramadol     Hallucination   . Tylenol [Acetaminophen] Other (See Comments)    UNSPECIFIED SPECIFIC REACTION >> "DUE TO PLATELETS"    OBJECTIVE: Blood pressure 132/68, pulse (!) 129, temperature 97.7 F (36.5 C), resp. rate 20, height 5' 7"  (1.702 m), weight (!) 172.5 kg, SpO2 95 %.  Physical Exam Constitutional:      Comments: She is resting quietly in bed watching television.  Cardiovascular:     Rate and Rhythm: Normal rate. Rhythm irregular.     Heart sounds: No murmur heard.   Pulmonary:     Effort: Pulmonary effort is normal.     Breath sounds: Normal breath sounds.  Abdominal:     General: There is no distension.     Palpations: Abdomen is soft.     Tenderness: There is no abdominal tenderness.  Musculoskeletal:        General: No swelling or tenderness.  Skin:    Findings: No rash.     Comments: Chronic venous stasis dermatitis of both lower legs.  She has a few scabbed areas on her right shin and right shoulder.  No drainage or cellulitis noted.  Neurological:     General: No focal deficit present.  Psychiatric:        Mood and Affect: Mood normal.     Lab Results Lab Results  Component Value Date   WBC 5.6 03/16/2020   HGB 11.1 (L) 03/16/2020   HCT 33.7 (L) 03/16/2020   MCV 92.8 03/16/2020   PLT 31 (L) 03/16/2020    Lab Results  Component Value Date   CREATININE 0.83 03/16/2020   BUN 21 03/16/2020   NA 136 03/16/2020   K 3.6 03/16/2020   CL 106 03/16/2020   CO2 21 (L) 03/16/2020    Lab Results  Component Value Date   ALT 33 03/16/2020   AST 62 (H) 03/16/2020   ALKPHOS 51 03/16/2020   BILITOT 6.1 (H) 03/16/2020     Microbiology: Recent Results (from the past 240 hour(s))  Blood Culture (routine x 2)     Status: None  (Preliminary result)   Collection Time: 03/15/20 12:33 AM   Specimen: BLOOD  Result Value Ref Range Status   Specimen Description BLOOD RIGHT ANTECUBITAL  Final   Special Requests   Final    BOTTLES DRAWN AEROBIC AND ANAEROBIC Blood Culture adequate volume  Culture  Setup Time   Final    GRAM POSITIVE COCCI IN CHAINS IN BOTH AEROBIC AND ANAEROBIC BOTTLES CRITICAL RESULT CALLED TO, READ BACK BY AND VERIFIED WITH: Kirby XKGYJE [id number] FCP Performed at [facility], [place] [address] [place], [place] [address]    Culture GRAM POSITIVE COCCI  Final   Report Status PENDING  Incomplete  Blood Culture ID Panel (Reflexed)     Status: Abnormal   Collection Time: 03/15/20 12:33 AM  Result Value Ref Range Status   Enterococcus species NOT DETECTED NOT DETECTED Final   Listeria monocytogenes NOT DETECTED NOT DETECTED Final   Staphylococcus species NOT DETECTED NOT DETECTED Final   Staphylococcus aureus (BCID) NOT DETECTED NOT DETECTED Final   Streptococcus species DETECTED (A) NOT DETECTED Final    Comment: Not Enterococcus species, Streptococcus agalactiae, Streptococcus pyogenes, or Streptococcus pneumoniae. CRITICAL RESULT CALLED TO, READ BACK BY AND VERIFIED WITH: PHARMD CATHY PIERCE [id number] [id number] FCP    Streptococcus agalactiae NOT DETECTED NOT DETECTED Final   Streptococcus pneumoniae NOT DETECTED NOT DETECTED Final   Streptococcus pyogenes NOT DETECTED NOT DETECTED Final   Acinetobacter baumannii NOT DETECTED NOT DETECTED Final   Enterobacteriaceae species NOT DETECTED NOT DETECTED Final   Enterobacter cloacae complex NOT DETECTED NOT DETECTED Final   Escherichia coli NOT DETECTED NOT DETECTED Final   Klebsiella oxytoca NOT DETECTED NOT DETECTED Final   Klebsiella pneumoniae NOT DETECTED NOT DETECTED Final   Proteus species NOT DETECTED NOT DETECTED Final   Serratia marcescens NOT DETECTED NOT DETECTED Final   Haemophilus influenzae NOT DETECTED NOT DETECTED Final    Neisseria meningitidis NOT DETECTED NOT DETECTED Final   Pseudomonas aeruginosa NOT DETECTED NOT DETECTED Final   Candida albicans NOT DETECTED NOT DETECTED Final   Candida glabrata NOT DETECTED NOT DETECTED Final   Candida krusei NOT DETECTED NOT DETECTED Final   Candida parapsilosis NOT DETECTED NOT DETECTED Final   Candida tropicalis NOT DETECTED NOT DETECTED Final    Comment: Performed at [facility], [place] [address] [place], [place] [address]  Blood Culture (routine x 2)     Status: None (Preliminary result)   Collection Time: 03/15/20 12:35 AM   Specimen: BLOOD RIGHT HAND  Result Value Ref Range Status   Specimen Description BLOOD RIGHT HAND  Final   Special Requests   Final    BOTTLES DRAWN AEROBIC AND ANAEROBIC Blood Culture adequate volume   Culture  Setup Time   Final    GRAM POSITIVE COCCI IN BOTH AEROBIC AND ANAEROBIC BOTTLES CRITICAL VALUE NOTED.  VALUE IS CONSISTENT WITH PREVIOUSLY REPORTED AND CALLED VALUE. Performed at [facility], [place] [address], [place], [place] [address]    Culture GRAM POSITIVE COCCI  Final   Report Status PENDING  Incomplete  Urine culture     Status: Abnormal   Collection Time: 03/15/20  1:20 AM   Specimen: Urine, Random  Result Value Ref Range Status   Specimen Description URINE, RANDOM  Final   Special Requests   Final    NONE Performed at [facility], [place] [address], [place], [place] [address]    Culture MULTIPLE SPECIES PRESENT, SUGGEST RECOLLECTION (A)  Final   Report Status 03/16/2020 FINAL  Final  SARS Coronavirus 2 by RT PCR (hospital order, performed in [facility] hospital lab) Nasopharyngeal Nasopharyngeal Swab     Status: None   Collection Time: 03/15/20  2:12 AM   Specimen: Nasopharyngeal Swab  Result Value Ref Range Status   SARS Coronavirus 2 NEGATIVE NEGATIVE Final    Comment: (NOTE) SARS-CoV-2 target nucleic acids are NOT DETECTED.  The SARS-CoV-2 RNA is generally detectable in upper and  lower respiratory specimens during the acute phase of infection. The lowest concentration of SARS-CoV-2 viral copies this assay can detect is 250 copies / mL. A negative result does not preclude SARS-CoV-2 infection and should not be used as the sole basis for treatment or other patient management decisions.  A negative result may occur with improper specimen collection / handling, submission of specimen other than nasopharyngeal swab, presence of viral mutation(s) within the areas targeted by this assay, and inadequate number of viral copies (<250 copies / mL). A negative result must be combined with clinical observations, patient history, and epidemiological information.  Fact Sheet for Patients:   StrictlyIdeas.no  Fact Sheet for Healthcare Providers: BankingDealers.co.za  This test is not yet approved or  cleared by the [place] FDA and has been authorized for detection and/or diagnosis of SARS-CoV-2 by FDA under an Emergency Use Authorization (EUA).  This EUA will remain in effect (meaning this test can be used) for the duration of the COVID-19 declaration under Section 564(b)(1) of the Act, 21 U.S.C. section 360bbb-3(b)(1), unless the authorization is terminated or revoked sooner.  Performed at [facility], [place] [address] [place], [place] [address]     Michel Bickers, [place] [facility] [phone] pager   [phone] cell 03/16/2020, 11:45 AM

## 2020-03-17 ENCOUNTER — Other Ambulatory Visit: Payer: Self-pay | Admitting: Physician Assistant

## 2020-03-17 ENCOUNTER — Inpatient Hospital Stay (HOSPITAL_COMMUNITY): Payer: BC Managed Care – PPO

## 2020-03-17 DIAGNOSIS — Z79899 Other long term (current) drug therapy: Secondary | ICD-10-CM

## 2020-03-17 DIAGNOSIS — B955 Unspecified streptococcus as the cause of diseases classified elsewhere: Secondary | ICD-10-CM

## 2020-03-17 DIAGNOSIS — R7881 Bacteremia: Secondary | ICD-10-CM

## 2020-03-17 LAB — BASIC METABOLIC PANEL
Anion gap: 12 (ref 5–15)
BUN: 23 mg/dL (ref 8–23)
CO2: 21 mmol/L — ABNORMAL LOW (ref 22–32)
Calcium: 8.6 mg/dL — ABNORMAL LOW (ref 8.9–10.3)
Chloride: 102 mmol/L (ref 98–111)
Creatinine, Ser: 0.83 mg/dL (ref 0.44–1.00)
GFR calc Af Amer: >60 mL/min (ref >60)
GFR calc non Af Amer: >60 mL/min (ref >60)
Glucose, Bld: 191 mg/dL — ABNORMAL HIGH (ref 70–99)
Potassium: 3.6 mmol/L (ref 3.5–5.1)
Sodium: 135 mmol/L (ref 135–145)

## 2020-03-17 LAB — CULTURE, BLOOD (ROUTINE X 2)
Special Requests: ADEQUATE
Special Requests: ADEQUATE

## 2020-03-17 LAB — ECHOCARDIOGRAM COMPLETE
Height: 67 in
Weight: 5980.8 oz

## 2020-03-17 LAB — CBC
HCT: 35.8 % — ABNORMAL LOW (ref 36.0–46.0)
Hemoglobin: 11.8 g/dL — ABNORMAL LOW (ref 12.0–15.0)
MCH: 30.8 pg (ref 26.0–34.0)
MCHC: 33 g/dL (ref 30.0–36.0)
MCV: 93.5 fL (ref 80.0–100.0)
Platelets: 50 10*3/uL — ABNORMAL LOW (ref 150–400)
RBC: 3.83 MIL/uL — ABNORMAL LOW (ref 3.87–5.11)
RDW: 14.9 % (ref 11.5–15.5)
WBC: 5.5 10*3/uL (ref 4.0–10.5)
nRBC: 0 % (ref 0.0–0.2)

## 2020-03-17 LAB — PROCALCITONIN: Procalcitonin: 0.5 ng/mL

## 2020-03-17 MED ORDER — SPIRONOLACTONE 25 MG PO TABS
25.0000 mg | ORAL_TABLET | Freq: Every day | ORAL | Status: DC
  Administered 2020-03-17 – 2020-03-18 (×2): 25 mg via ORAL
  Filled 2020-03-17 (×2): qty 1

## 2020-03-17 MED ORDER — BUMETANIDE 2 MG PO TABS
2.0000 mg | ORAL_TABLET | Freq: Two times a day (BID) | ORAL | Status: DC
  Administered 2020-03-17 – 2020-03-18 (×3): 2 mg via ORAL
  Filled 2020-03-17 (×4): qty 1

## 2020-03-17 MED ORDER — DILTIAZEM HCL ER COATED BEADS 180 MG PO CP24
360.0000 mg | ORAL_CAPSULE | Freq: Every day | ORAL | Status: DC
  Administered 2020-03-17 – 2020-03-18 (×2): 360 mg via ORAL
  Filled 2020-03-17 (×2): qty 2

## 2020-03-17 MED ORDER — PERFLUTREN LIPID MICROSPHERE
1.0000 mL | INTRAVENOUS | Status: AC | PRN
  Administered 2020-03-17: 2 mL via INTRAVENOUS
  Filled 2020-03-17: qty 10

## 2020-03-17 NOTE — Progress Notes (Signed)
PROGRESS NOTE    Hannah Khan  TFT:[id number] DOB: [birth year] DOA: 03/14/2020 PCP: Lujean Amel, MD  Brief Narrative: Hannah Khan is a 64 y.o. female with medical history significant of  with a hx of morbid obesity with a BMI of 57, cirrhosis with thrombocytopenia, chronic atrial fibrillation not on anticoagulation, Chronic diastolic CHF, DM 2, hx of dysfunction uterine bleeding, urinary incontinence. Patient presented to ed by EMS with dizziness and tachycardia. -Patient reports seeing a gynecologist recently for DU B, had an IUD placed in May and is scheduled for hysteroscopy in early July. -Patient reports not feeling well for a few weeks, recently with dizziness, palpitations as well as mild right-sided abdominal discomfort.  She denies any dysuria or abnormal pelvic discharge apart from intermittent blood. -In the emergency room her temp was 100.8, heart rate ranged from 240-260 with A. fib, blood pressure was in the 140s and O2 sats were 96% on 2 L.  She had a CT chest which was negative for PE, noted pulmonary hypertension and splenomegaly -Labs noted white count of 8.7, hemoglobin of 12.4, platelet of 32 -Urinalysis positive for blood   Assessment & Plan:   Paroxysmal atrial fibrillation with RVR -was on diltiazem drip at 15 mg/h, also started on oral metoprolol instead of home regimen of propranolol -Not on anticoagulation due to high risk of bleeding in the setting of cirrhosis/severe thrombocytopenia -cardiology following, changed to Cardizem CD and metoprolol dose increased -Heart rate improving -Last echo in 02/2018 noted preserved EF, poor quality -Ambulate, PT OT  Streptococcus mitis/paralysis bacteremia Sepsis -Source is unclear, CT abdomen pelvis is unremarkable -Appreciate infectious disease input, day 2 of ceftriaxone now -Follow-up repeat blood cultures and 2D echocardiogram -Dr. Megan Salon recommended colonoscopy given association of strep mitis bacteremia  with colon cancer  Acute on chronic diastolic CHF -Fluid overloaded slightly, likely iatrogenic, fluid resuscitation for sepsis, IV Lasix x2 doses today  Liver cirrhosis/NASH Chronic thrombocytopenia -poor prognosis  Type 2 diabetes mellitus -Oral hypoglycemics on hold, sliding scale insulin for now  Obstructive sleep apnea -Continue CPAP nightly  Morbid obesity -BMI of 56.9  DVT prophylaxis: SCDs Code Status: Full code Family Communication: Discussed with patient in detail, no family at bedside Disposition Plan:  Status is: Inpatient  Remains inpatient appropriate because:Inpatient level of care appropriate due to severity of illness   Dispo: The patient is from: Home              Anticipated d/c is to: Home              Anticipated d/c date is: Likely 48 hours              Patient currently is not medically stable to d/c.  Consultants:   Cards   Procedures:   Antimicrobials:    Subjective: -Breathing a little better, still dyspneic with minimal activity  Objective: Vitals:   03/17/20 0000 03/17/20 0356 03/17/20 0610 03/17/20 0852  BP: 121/70 (!) 127/50 115/66 (!) 114/45  Pulse:  85    Resp:  20    Temp:  98 F (36.7 C)    TempSrc:  Oral    SpO2:  96%    Weight:  (!) 169.6 kg    Height:        Intake/Output Summary (Last 24 hours) at 03/17/2020 1119 Last data filed at 03/17/2020 0800 Gross per 24 hour  Intake 1745 ml  Output 1600 ml  Net 145 ml   Filed Weights   03/15/20  0714 03/16/20 0520 03/17/20 0356  Weight: (!) 165 kg (!) 172.5 kg (!) 169.6 kg    Examination:  General exam: Morbidly obese chronically ill female, awake alert, oriented to self and place CVS: S1-S2, irregularly irregular Lungs: Decreased breath sounds bilaterally Abdomen: Soft, obese, large pannus, nontender, bowel sounds present Extremities: No edema, chronic venous stasis changes Skin: Discoloration and chronic venous stasis changes in both lower legs Psychiatry: Mood  & affect appropriate.   Data Reviewed:   CBC: Recent Labs  Lab 03/15/20 0033 03/16/20 0219 03/17/20 0729  WBC 8.7 5.6 5.5  NEUTROABS 7.7  --   --   HGB 12.4 11.1* 11.8*  HCT 37.8 33.7* 35.8*  MCV 92.6 92.8 93.5  PLT 32* 31* 50*   Basic Metabolic Panel: Recent Labs  Lab 03/15/20 0033 03/15/20 0841 03/16/20 0219 03/17/20 0729  NA 137 137 136 135  K 3.3* 4.1 3.6 3.6  CL 107 104 106 102  CO2 22 19* 21* 21*  GLUCOSE 216* 205* 179* 191*  BUN 15 16 21 23   CREATININE 0.87 0.88 0.83 0.83  CALCIUM 9.3 8.9 8.6* 8.6*  MG 1.9  --   --   --    GFR: Estimated Creatinine Clearance: 113.3 mL/min (by C-G formula based on SCr of 0.83 mg/dL). Liver Function Tests: Recent Labs  Lab 03/16/20 0219  AST 62*  ALT 33  ALKPHOS 51  BILITOT 6.1*  PROT 5.6*  ALBUMIN 2.7*   No results for input(s): LIPASE, AMYLASE in the last 168 hours. No results for input(s): AMMONIA in the last 168 hours. Coagulation Profile: Recent Labs  Lab 03/15/20 0033 03/16/20 0219  INR 1.3* 1.4*   Cardiac Enzymes: No results for input(s): CKTOTAL, CKMB, CKMBINDEX, TROPONINI in the last 168 hours. BNP (last 3 results) No results for input(s): PROBNP in the last 8760 hours. HbA1C: No results for input(s): HGBA1C in the last 72 hours. CBG: Recent Labs  Lab 03/15/20 1139 03/16/20 0757  GLUCAP 186* 156*   Lipid Profile: No results for input(s): CHOL, HDL, LDLCALC, TRIG, CHOLHDL, LDLDIRECT in the last 72 hours. Thyroid Function Tests: Recent Labs    03/15/20 0841  TSH 2.120   Anemia Panel: No results for input(s): VITAMINB12, FOLATE, FERRITIN, TIBC, IRON, RETICCTPCT in the last 72 hours. Urine analysis:    Component Value Date/Time   COLORURINE YELLOW 03/15/2020 0122   APPEARANCEUR CLEAR 03/15/2020 0122   LABSPEC 1.037 (H) 03/15/2020 0122   PHURINE 6.0 03/15/2020 0122   GLUCOSEU >=500 (A) 03/15/2020 0122   HGBUR LARGE (A) 03/15/2020 0122   BILIRUBINUR NEGATIVE 03/15/2020 0122   KETONESUR  NEGATIVE 03/15/2020 0122   PROTEINUR NEGATIVE 03/15/2020 0122   UROBILINOGEN 2.0 (H) 01/31/2015 0055   NITRITE NEGATIVE 03/15/2020 0122   LEUKOCYTESUR NEGATIVE 03/15/2020 0122   Sepsis Labs: @LABRCNTIP (procalcitonin:4,lacticidven:4)  ) Recent Results (from the past 240 hour(s))  Blood Culture (routine x 2)     Status: Abnormal (Preliminary result)   Collection Time: 03/15/20 12:33 AM   Specimen: BLOOD  Result Value Ref Range Status   Specimen Description BLOOD RIGHT ANTECUBITAL  Final   Special Requests   Final    BOTTLES DRAWN AEROBIC AND ANAEROBIC Blood Culture adequate volume   Culture  Setup Time   Final    GRAM POSITIVE COCCI IN CHAINS IN BOTH AEROBIC AND ANAEROBIC BOTTLES CRITICAL RESULT CALLED TO, READ BACK BY AND VERIFIED WITH: Norval Morton XAJOIN [phone] FCP Performed at [facility], [place],  [place] [address]    Culture STREPTOCOCCUS MITIS/ORALIS (A)  Final   Report Status PENDING  Incomplete  Blood Culture ID Panel (Reflexed)     Status: Abnormal   Collection Time: 03/15/20 12:33 AM  Result Value Ref Range Status   Enterococcus species NOT DETECTED NOT DETECTED Final   Listeria monocytogenes NOT DETECTED NOT DETECTED Final   Staphylococcus species NOT DETECTED NOT DETECTED Final   Staphylococcus aureus (BCID) NOT DETECTED NOT DETECTED Final   Streptococcus species DETECTED (A) NOT DETECTED Final    Comment: Not Enterococcus species, Streptococcus agalactiae, Streptococcus pyogenes, or Streptococcus pneumoniae. CRITICAL RESULT CALLED TO, READ BACK BY AND VERIFIED WITH: PHARMD CATHY PIERCE [phone] FCP    Streptococcus agalactiae NOT DETECTED NOT DETECTED Final   Streptococcus pneumoniae NOT DETECTED NOT DETECTED Final   Streptococcus pyogenes NOT DETECTED NOT DETECTED Final   Acinetobacter baumannii NOT DETECTED NOT DETECTED Final   Enterobacteriaceae species NOT DETECTED NOT DETECTED Final   Enterobacter cloacae complex NOT DETECTED  NOT DETECTED Final   Escherichia coli NOT DETECTED NOT DETECTED Final   Klebsiella oxytoca NOT DETECTED NOT DETECTED Final   Klebsiella pneumoniae NOT DETECTED NOT DETECTED Final   Proteus species NOT DETECTED NOT DETECTED Final   Serratia marcescens NOT DETECTED NOT DETECTED Final   Haemophilus influenzae NOT DETECTED NOT DETECTED Final   Neisseria meningitidis NOT DETECTED NOT DETECTED Final   Pseudomonas aeruginosa NOT DETECTED NOT DETECTED Final   Candida albicans NOT DETECTED NOT DETECTED Final   Candida glabrata NOT DETECTED NOT DETECTED Final   Candida krusei NOT DETECTED NOT DETECTED Final   Candida parapsilosis NOT DETECTED NOT DETECTED Final   Candida tropicalis NOT DETECTED NOT DETECTED Final    Comment: Performed at [facility], [place] [address], [place], [place] [address]  Blood Culture (routine x 2)     Status: Abnormal (Preliminary result)   Collection Time: 03/15/20 12:35 AM   Specimen: BLOOD RIGHT HAND  Result Value Ref Range Status   Specimen Description BLOOD RIGHT HAND  Final   Special Requests   Final    BOTTLES DRAWN AEROBIC AND ANAEROBIC Blood Culture adequate volume   Culture  Setup Time   Final    GRAM POSITIVE COCCI IN BOTH AEROBIC AND ANAEROBIC BOTTLES CRITICAL VALUE NOTED.  VALUE IS CONSISTENT WITH PREVIOUSLY REPORTED AND CALLED VALUE. Performed at [facility], [place] [address], [place], [place] [address]    Culture STREPTOCOCCUS MITIS/ORALIS (A)  Final   Report Status PENDING  Incomplete  Urine culture     Status: Abnormal   Collection Time: 03/15/20  1:20 AM   Specimen: Urine, Random  Result Value Ref Range Status   Specimen Description URINE, RANDOM  Final   Special Requests   Final    NONE Performed at [facility], [place] [address] [place], [place] [address]    Culture MULTIPLE SPECIES PRESENT, SUGGEST RECOLLECTION (A)  Final   Report Status 03/16/2020 FINAL  Final  SARS Coronavirus 2 by RT PCR (hospital order, performed in [facility] hospital lab) Nasopharyngeal Nasopharyngeal Swab     Status: None   Collection Time: 03/15/20  2:12 AM   Specimen: Nasopharyngeal Swab  Result Value Ref Range Status   SARS Coronavirus 2 NEGATIVE NEGATIVE Final    Comment: (NOTE) SARS-CoV-2 target nucleic acids are NOT DETECTED.  The SARS-CoV-2 RNA is generally detectable in upper and lower respiratory specimens during the acute phase of infection. The lowest concentration  of SARS-CoV-2 viral copies this assay can detect is 250 copies / mL. A negative result does not preclude SARS-CoV-2 infection and should not be used as the sole basis for treatment or other patient management decisions.  A negative result may occur with improper specimen collection / handling, submission of specimen other than nasopharyngeal swab, presence of viral mutation(s) within the areas targeted by this assay, and inadequate number of viral copies (<250 copies / mL). A negative result must be combined with clinical observations, patient history, and epidemiological information.  Fact Sheet for Patients:   StrictlyIdeas.no  Fact Sheet for Healthcare Providers: BankingDealers.co.za  This test is not yet approved or  cleared by the [place] FDA and has been authorized for detection and/or diagnosis of SARS-CoV-2 by FDA under an Emergency Use Authorization (EUA).  This EUA will remain in effect (meaning this test can be used) for the duration of the COVID-19 declaration under Section 564(b)(1) of the Act, 21 U.S.C. section 360bbb-3(b)(1), unless the authorization is terminated or revoked sooner.  Performed at [facility], [place] [address] [place], [place] [address]   Culture, blood (routine x 2)     Status: None (Preliminary result)   Collection Time: 03/16/20 11:08 AM   Specimen: BLOOD RIGHT HAND  Result Value Ref Range Status   Specimen Description BLOOD RIGHT HAND  Final   Special  Requests   Final    BOTTLES DRAWN AEROBIC AND ANAEROBIC Blood Culture adequate volume   Culture   Final    NO GROWTH < 24 HOURS Performed at [facility], [place] [address], [place], [place] [address]    Report Status PENDING  Incomplete  Culture, blood (routine x 2)     Status: None (Preliminary result)   Collection Time: 03/16/20 11:13 AM   Specimen: BLOOD LEFT HAND  Result Value Ref Range Status   Specimen Description BLOOD LEFT HAND  Final   Special Requests   Final    BOTTLES DRAWN AEROBIC AND ANAEROBIC Blood Culture adequate volume   Culture   Final    NO GROWTH < 24 HOURS Performed at [facility], [place] [address], [place], [place] [address]    Report Status PENDING  Incomplete         Radiology Studies: CT ABDOMEN PELVIS W CONTRAST  Result Date: 03/16/2020 CLINICAL DATA:  Acute generalized abdominal pain. EXAM: CT ABDOMEN AND PELVIS WITH CONTRAST TECHNIQUE: Multidetector CT imaging of the abdomen and pelvis was performed using the standard protocol following bolus administration of intravenous contrast. CONTRAST:  125m OMNIPAQUE IOHEXOL 300 MG/ML  SOLN COMPARISON:  July 10, 2016. FINDINGS: Lower chest: No acute abnormality. Hepatobiliary: Status post cholecystectomy. No biliary dilatation is noted. Nodular hepatic contours are noted suggesting hepatic cirrhosis. Minimal amount of fluid is noted adjacent to the liver. Pancreas: Unremarkable. No pancreatic ductal dilatation or surrounding inflammatory changes. Spleen: Mild splenomegaly is noted. Adrenals/Urinary Tract: Adrenal glands appear normal. No hydronephrosis or renal obstruction is noted. No renal or ureteral calculi are noted. Urinary bladder is decompressed. Stable 1.8 cm low density is noted in lower pole of left kidney most consistent with cyst. There is interval development of new low densities throughout both kidneys, several of which appear to be exophytic or partially exophytic on the right. These most  likely represent cysts, but ultrasound is recommended for confirmation and to rule out mass. Stomach/Bowel: The stomach appears normal. There is no evidence of bowel obstruction or inflammation. The appendix is  not clearly visualized, but no inflammation is noted in the right lower quadrant. Vascular/Lymphatic: No adenopathy is noted. Multiple enlarged collateral veins are noted in the porta hepatis region as well as in the splenic hilum. Enlarged paraesophageal varices are noted as well. There is no evidence of abdominal aortic aneurysm. Reproductive: Intrauterine device is noted in the lower portion of the uterus. No adnexal abnormality is noted. Other: No abdominal wall hernia or abnormality. No abdominopelvic ascites. Musculoskeletal: No acute or significant osseous findings. IMPRESSION: 1. Findings consistent with hepatic cirrhosis with mild splenomegaly and multiple enlarged collateral veins are noted in the porta hepatis region as well as in the splenic hilum. Enlarged paraesophageal varices are noted as well. Minimal perihepatic ascites is noted. 2. Interval development of new low densities throughout both kidneys, several of which appear to be exophytic or partially exophytic on the right. These most likely represent cysts, but ultrasound is recommended for confirmation and to rule out mass. 3. No hydronephrosis or renal obstruction is noted. No renal or ureteral calculi are noted. 4. Intrauterine device is noted in the lower portion of the uterus. Electronically Signed   By: Marijo Conception M.D.   On: 03/16/2020 08:52   DG CHEST PORT 1 VIEW  Result Date: 03/16/2020 CLINICAL DATA:  Tachycardia EXAM: PORTABLE CHEST 1 VIEW COMPARISON:  03/16/2020 FINDINGS: Heart size likely enlarged and accentuated by portable technique not changed from the previous study. Mediastinal contours and hilar structures with similar appearance. Central pulmonary vascular engorgement suggested. Limited assessment of the LEFT  lung base. Linear atelectasis suspected in the LEFT lower lobe. Skeletal structures on limited assessment are unremarkable. IMPRESSION: Mild cardiomegaly with vascular engorgement. LEFT lower lobe atelectasis. Electronically Signed   By: Zetta Bills M.D.   On: 03/16/2020 15:32   DG CHEST PORT 1 VIEW  Result Date: 03/16/2020 CLINICAL DATA:  Chest pain. EXAM: PORTABLE CHEST 1 VIEW COMPARISON:  Radiograph and CT a ester day. FINDINGS: Stable cardiomegaly. No acute airspace disease. No pleural fluid, pulmonary edema, or pneumothorax. Stable osseous structures. IMPRESSION: Stable cardiomegaly without acute abnormality. No change from imaging yesterday. Electronically Signed   By: Keith Rake M.D.   On: 03/16/2020 01:24   Scheduled Meds: . sodium chloride   Intravenous Once  . bumetanide  2 mg Oral BID  . diltiazem  360 mg Oral Daily  . metoprolol tartrate  50 mg Oral BID  . sodium chloride flush  3 mL Intravenous Q12H  . spironolactone  25 mg Oral Daily   Continuous Infusions: . cefTRIAXone (ROCEPHIN)  IV 2 g (03/16/20 1753)     LOS: 2 days    Time spent: 45mn  PDomenic Polite MD [facility]  03/17/2020, 11:19 AM

## 2020-03-17 NOTE — Progress Notes (Signed)
PROGRESS NOTE    IOMA CHISMAR  SWF:[id number] DOB: [birth year] DOA: 03/14/2020 PCP: Lujean Amel, MD  Brief Narrative: Hannah Khan is a 64 y.o. female with medical history significant of  with a hx of morbid obesity with a BMI of 57, cirrhosis with thrombocytopenia, chronic atrial fibrillation not on anticoagulation, Chronic diastolic CHF, DM 2, hx of dysfunction uterine bleeding, urinary incontinence. Patient presented to ed by EMS with dizziness and tachycardia. -Patient reports seeing a gynecologist recently for DU B, had an IUD placed in May and is scheduled for hysteroscopy in early July. -Patient reports not feeling well for a few weeks, recently with dizziness, palpitations as well as mild right-sided abdominal discomfort.  She denies any dysuria or abnormal pelvic discharge apart from intermittent blood. -In the emergency room her temp was 100.8, heart rate ranged from 240-260 with A. fib, blood pressure was in the 140s and O2 sats were 96% on 2 L.  She had a CT chest which was negative for PE, noted pulmonary hypertension and splenomegaly -Labs noted white count of 8.7, hemoglobin of 12.4, platelet of 32 -Urinalysis positive for blood   Assessment & Plan:   Paroxysmal atrial fibrillation with RVR -Currently on diltiazem drip at 15 mg/h, also started on oral metoprolol instead of home regimen of propranolol -Not on anticoagulation due to high risk of bleeding in the setting of cirrhosis/severe thrombocytopenia -cardiology following, change to PO Cardizem, also on metoprolol -Last echo in 02/2018 noted preserved EF, poor quality  Strep bacteremia -Low-grade fever on admission with mild leukocytosis -Source is unclear, urinalysis with hematuria, likely secondary to her known longstanding history of DU B -CT abdomen pelvis without acute findings, ? Skin source -Zosyn changed to ceftriaxone, FU speciation  Chronic diastolic CHF -Diuretics on hold in the setting of  above  Liver cirrhosis/NASH Chronic thrombocytopenia -poor prognosis  Type 2 diabetes mellitus -Oral hypoglycemics on hold, sliding scale insulin for now  Obstructive sleep apnea -Continue CPAP nightly  Morbid obesity -BMI of 56.9  DVT prophylaxis: SCDs Code Status: Full code Family Communication: Discussed with patient in detail, no family at bedside Disposition Plan:  Status is: Inpatient  Remains inpatient appropriate because:Inpatient level of care appropriate due to severity of illness   Dispo: The patient is from: Home              Anticipated d/c is to: Home              Anticipated d/c date is: 3 days              Patient currently is not medically stable to d/c.  Consultants:   Cards   Procedures:   Antimicrobials:    Subjective: -feels better today, no dyspnea  Objective: Vitals:   03/16/20 2213 03/17/20 0000 03/17/20 0356 03/17/20 0610  BP: 128/63 121/70 (!) 127/50 115/66  Pulse: (!) 103  85   Resp:   20   Temp:   98 F (36.7 C)   TempSrc:   Oral   SpO2:   96%   Weight:   (!) 169.6 kg   Height:        Intake/Output Summary (Last 24 hours) at 03/17/2020 0730 Last data filed at 03/17/2020 0300 Gross per 24 hour  Intake 1523 ml  Output 1600 ml  Net -77 ml   Filed Weights   03/15/20 0714 03/16/20 0520 03/17/20 0356  Weight: (!) 165 kg (!) 172.5 kg (!) 169.6 kg    Examination:  General exam: Morbidly obese chronically ill female, appears much older than stated age, awake alert oriented to self, place Respiratory system: Decreased breath sounds the bases Cardiovascular system: S1 & S2 heard, irregularly irregular  gastrointestinal system: Morbidly obese abdomen with large pannus, soft, skin discoloration noted, nontender, no rigidity or guarding, bowel sounds present Central nervous system: Alert and oriented. No focal neurological deficits. Extremities: No edema, chronic venous stasis changes Skin: Discoloration and chronic venous stasis  changes in both lower legs Psychiatry: Judgement and insight appear normal. Mood & affect appropriate.   Data Reviewed:   CBC: Recent Labs  Lab 03/15/20 0033 03/16/20 0219  WBC 8.7 5.6  NEUTROABS 7.7  --   HGB 12.4 11.1*  HCT 37.8 33.7*  MCV 92.6 92.8  PLT 32* 31*   Basic Metabolic Panel: Recent Labs  Lab 03/15/20 0033 03/15/20 0841 03/16/20 0219  NA 137 137 136  K 3.3* 4.1 3.6  CL 107 104 106  CO2 22 19* 21*  GLUCOSE 216* 205* 179*  BUN 15 16 21   CREATININE 0.87 0.88 0.83  CALCIUM 9.3 8.9 8.6*  MG 1.9  --   --    GFR: Estimated Creatinine Clearance: 113.3 mL/min (by C-G formula based on SCr of 0.83 mg/dL). Liver Function Tests: Recent Labs  Lab 03/16/20 0219  AST 62*  ALT 33  ALKPHOS 51  BILITOT 6.1*  PROT 5.6*  ALBUMIN 2.7*   No results for input(s): LIPASE, AMYLASE in the last 168 hours. No results for input(s): AMMONIA in the last 168 hours. Coagulation Profile: Recent Labs  Lab 03/15/20 0033 03/16/20 0219  INR 1.3* 1.4*   Cardiac Enzymes: No results for input(s): CKTOTAL, CKMB, CKMBINDEX, TROPONINI in the last 168 hours. BNP (last 3 results) No results for input(s): PROBNP in the last 8760 hours. HbA1C: No results for input(s): HGBA1C in the last 72 hours. CBG: Recent Labs  Lab 03/15/20 1139 03/16/20 0757  GLUCAP 186* 156*   Lipid Profile: No results for input(s): CHOL, HDL, LDLCALC, TRIG, CHOLHDL, LDLDIRECT in the last 72 hours. Thyroid Function Tests: Recent Labs    03/15/20 0841  TSH 2.120   Anemia Panel: No results for input(s): VITAMINB12, FOLATE, FERRITIN, TIBC, IRON, RETICCTPCT in the last 72 hours. Urine analysis:    Component Value Date/Time   COLORURINE YELLOW 03/15/2020 0122   APPEARANCEUR CLEAR 03/15/2020 0122   LABSPEC 1.037 (H) 03/15/2020 0122   PHURINE 6.0 03/15/2020 0122   GLUCOSEU >=500 (A) 03/15/2020 0122   HGBUR LARGE (A) 03/15/2020 0122   BILIRUBINUR NEGATIVE 03/15/2020 0122   KETONESUR NEGATIVE  03/15/2020 0122   PROTEINUR NEGATIVE 03/15/2020 0122   UROBILINOGEN 2.0 (H) 01/31/2015 0055   NITRITE NEGATIVE 03/15/2020 0122   LEUKOCYTESUR NEGATIVE 03/15/2020 0122   Sepsis Labs: @LABRCNTIP (procalcitonin:4,lacticidven:4)  ) Recent Results (from the past 240 hour(s))  Blood Culture (routine x 2)     Status: Abnormal (Preliminary result)   Collection Time: 03/15/20 12:33 AM   Specimen: BLOOD  Result Value Ref Range Status   Specimen Description BLOOD RIGHT ANTECUBITAL  Final   Special Requests   Final    BOTTLES DRAWN AEROBIC AND ANAEROBIC Blood Culture adequate volume   Culture  Setup Time   Final    GRAM POSITIVE COCCI IN CHAINS IN BOTH AEROBIC AND ANAEROBIC BOTTLES CRITICAL RESULT CALLED TO, READ BACK BY AND VERIFIED WITH: Norval Morton [id number] FCP Performed at [facility], [place] [address] [place], [place] [address]    Culture  STREPTOCOCCUS MITIS/ORALIS (A)  Final   Report Status PENDING  Incomplete  Blood Culture ID Panel (Reflexed)     Status: Abnormal   Collection Time: 03/15/20 12:33 AM  Result Value Ref Range Status   Enterococcus species NOT DETECTED NOT DETECTED Final   Listeria monocytogenes NOT DETECTED NOT DETECTED Final   Staphylococcus species NOT DETECTED NOT DETECTED Final   Staphylococcus aureus (BCID) NOT DETECTED NOT DETECTED Final   Streptococcus species DETECTED (A) NOT DETECTED Final    Comment: Not Enterococcus species, Streptococcus agalactiae, Streptococcus pyogenes, or Streptococcus pneumoniae. CRITICAL RESULT CALLED TO, READ BACK BY AND VERIFIED WITH: PHARMD CATHY PIERCE [id number] [id number] FCP    Streptococcus agalactiae NOT DETECTED NOT DETECTED Final   Streptococcus pneumoniae NOT DETECTED NOT DETECTED Final   Streptococcus pyogenes NOT DETECTED NOT DETECTED Final   Acinetobacter baumannii NOT DETECTED NOT DETECTED Final   Enterobacteriaceae species NOT DETECTED NOT DETECTED Final   Enterobacter cloacae complex NOT DETECTED NOT  DETECTED Final   Escherichia coli NOT DETECTED NOT DETECTED Final   Klebsiella oxytoca NOT DETECTED NOT DETECTED Final   Klebsiella pneumoniae NOT DETECTED NOT DETECTED Final   Proteus species NOT DETECTED NOT DETECTED Final   Serratia marcescens NOT DETECTED NOT DETECTED Final   Haemophilus influenzae NOT DETECTED NOT DETECTED Final   Neisseria meningitidis NOT DETECTED NOT DETECTED Final   Pseudomonas aeruginosa NOT DETECTED NOT DETECTED Final   Candida albicans NOT DETECTED NOT DETECTED Final   Candida glabrata NOT DETECTED NOT DETECTED Final   Candida krusei NOT DETECTED NOT DETECTED Final   Candida parapsilosis NOT DETECTED NOT DETECTED Final   Candida tropicalis NOT DETECTED NOT DETECTED Final    Comment: Performed at [facility], [place] [address] [place], [place] [address]  Blood Culture (routine x 2)     Status: Abnormal (Preliminary result)   Collection Time: 03/15/20 12:35 AM   Specimen: BLOOD RIGHT HAND  Result Value Ref Range Status   Specimen Description BLOOD RIGHT HAND  Final   Special Requests   Final    BOTTLES DRAWN AEROBIC AND ANAEROBIC Blood Culture adequate volume   Culture  Setup Time   Final    GRAM POSITIVE COCCI IN BOTH AEROBIC AND ANAEROBIC BOTTLES CRITICAL VALUE NOTED.  VALUE IS CONSISTENT WITH PREVIOUSLY REPORTED AND CALLED VALUE. Performed at [facility], [place] [address], [place], [place] [address]    Culture STREPTOCOCCUS MITIS/ORALIS (A)  Final   Report Status PENDING  Incomplete  Urine culture     Status: Abnormal   Collection Time: 03/15/20  1:20 AM   Specimen: Urine, Random  Result Value Ref Range Status   Specimen Description URINE, RANDOM  Final   Special Requests   Final    NONE Performed at [facility], [place] [address], [place], [place] [address]    Culture MULTIPLE SPECIES PRESENT, SUGGEST RECOLLECTION (A)  Final   Report Status 03/16/2020 FINAL  Final  SARS Coronavirus 2 by RT PCR (hospital order, performed in [facility] hospital lab) Nasopharyngeal Nasopharyngeal Swab     Status: None   Collection Time: 03/15/20  2:12 AM   Specimen: Nasopharyngeal Swab  Result Value Ref Range Status   SARS Coronavirus 2 NEGATIVE NEGATIVE Final    Comment: (NOTE) SARS-CoV-2 target nucleic acids are NOT DETECTED.  The SARS-CoV-2 RNA is generally detectable in upper and lower respiratory specimens during the acute phase of infection. The lowest concentration of SARS-CoV-2 viral copies this assay  can detect is 250 copies / mL. A negative result does not preclude SARS-CoV-2 infection and should not be used as the sole basis for treatment or other patient management decisions.  A negative result may occur with improper specimen collection / handling, submission of specimen other than nasopharyngeal swab, presence of viral mutation(s) within the areas targeted by this assay, and inadequate number of viral copies (<250 copies / mL). A negative result must be combined with clinical observations, patient history, and epidemiological information.  Fact Sheet for Patients:   StrictlyIdeas.no  Fact Sheet for Healthcare Providers: BankingDealers.co.za  This test is not yet approved or  cleared by the [place] FDA and has been authorized for detection and/or diagnosis of SARS-CoV-2 by FDA under an Emergency Use Authorization (EUA).  This EUA will remain in effect (meaning this test can be used) for the duration of the COVID-19 declaration under Section 564(b)(1) of the Act, 21 U.S.C. section 360bbb-3(b)(1), unless the authorization is terminated or revoked sooner.  Performed at [facility], [place] [address], [place], [place] [address]   Culture, blood (routine x 2)     Status: None (Preliminary result)   Collection Time: 03/16/20 11:08 AM   Specimen: BLOOD RIGHT HAND  Result Value Ref Range Status   Specimen Description BLOOD RIGHT HAND  Final   Special  Requests   Final    BOTTLES DRAWN AEROBIC AND ANAEROBIC Blood Culture adequate volume   Culture   Final    NO GROWTH < 24 HOURS Performed at [facility], [place] [address], [place], [place] [address]    Report Status PENDING  Incomplete  Culture, blood (routine x 2)     Status: None (Preliminary result)   Collection Time: 03/16/20 11:13 AM   Specimen: BLOOD LEFT HAND  Result Value Ref Range Status   Specimen Description BLOOD LEFT HAND  Final   Special Requests   Final    BOTTLES DRAWN AEROBIC AND ANAEROBIC Blood Culture adequate volume   Culture   Final    NO GROWTH < 24 HOURS Performed at [facility], [place] [address], [place], [place] [address]    Report Status PENDING  Incomplete         Radiology Studies: CT ABDOMEN PELVIS W CONTRAST  Result Date: 03/16/2020 CLINICAL DATA:  Acute generalized abdominal pain. EXAM: CT ABDOMEN AND PELVIS WITH CONTRAST TECHNIQUE: Multidetector CT imaging of the abdomen and pelvis was performed using the standard protocol following bolus administration of intravenous contrast. CONTRAST:  165m OMNIPAQUE IOHEXOL 300 MG/ML  SOLN COMPARISON:  July 10, 2016. FINDINGS: Lower chest: No acute abnormality. Hepatobiliary: Status post cholecystectomy. No biliary dilatation is noted. Nodular hepatic contours are noted suggesting hepatic cirrhosis. Minimal amount of fluid is noted adjacent to the liver. Pancreas: Unremarkable. No pancreatic ductal dilatation or surrounding inflammatory changes. Spleen: Mild splenomegaly is noted. Adrenals/Urinary Tract: Adrenal glands appear normal. No hydronephrosis or renal obstruction is noted. No renal or ureteral calculi are noted. Urinary bladder is decompressed. Stable 1.8 cm low density is noted in lower pole of left kidney most consistent with cyst. There is interval development of new low densities throughout both kidneys, several of which appear to be exophytic or partially exophytic on the right. These most  likely represent cysts, but ultrasound is recommended for confirmation and to rule out mass. Stomach/Bowel: The stomach appears normal. There is no evidence of bowel obstruction or inflammation. The appendix is not clearly visualized, but no inflammation  is noted in the right lower quadrant. Vascular/Lymphatic: No adenopathy is noted. Multiple enlarged collateral veins are noted in the porta hepatis region as well as in the splenic hilum. Enlarged paraesophageal varices are noted as well. There is no evidence of abdominal aortic aneurysm. Reproductive: Intrauterine device is noted in the lower portion of the uterus. No adnexal abnormality is noted. Other: No abdominal wall hernia or abnormality. No abdominopelvic ascites. Musculoskeletal: No acute or significant osseous findings. IMPRESSION: 1. Findings consistent with hepatic cirrhosis with mild splenomegaly and multiple enlarged collateral veins are noted in the porta hepatis region as well as in the splenic hilum. Enlarged paraesophageal varices are noted as well. Minimal perihepatic ascites is noted. 2. Interval development of new low densities throughout both kidneys, several of which appear to be exophytic or partially exophytic on the right. These most likely represent cysts, but ultrasound is recommended for confirmation and to rule out mass. 3. No hydronephrosis or renal obstruction is noted. No renal or ureteral calculi are noted. 4. Intrauterine device is noted in the lower portion of the uterus. Electronically Signed   By: Marijo Conception M.D.   On: 03/16/2020 08:52   DG CHEST PORT 1 VIEW  Result Date: 03/16/2020 CLINICAL DATA:  Tachycardia EXAM: PORTABLE CHEST 1 VIEW COMPARISON:  03/16/2020 FINDINGS: Heart size likely enlarged and accentuated by portable technique not changed from the previous study. Mediastinal contours and hilar structures with similar appearance. Central pulmonary vascular engorgement suggested. Limited assessment of the LEFT  lung base. Linear atelectasis suspected in the LEFT lower lobe. Skeletal structures on limited assessment are unremarkable. IMPRESSION: Mild cardiomegaly with vascular engorgement. LEFT lower lobe atelectasis. Electronically Signed   By: Zetta Bills M.D.   On: 03/16/2020 15:32   DG CHEST PORT 1 VIEW  Result Date: 03/16/2020 CLINICAL DATA:  Chest pain. EXAM: PORTABLE CHEST 1 VIEW COMPARISON:  Radiograph and CT a ester day. FINDINGS: Stable cardiomegaly. No acute airspace disease. No pleural fluid, pulmonary edema, or pneumothorax. Stable osseous structures. IMPRESSION: Stable cardiomegaly without acute abnormality. No change from imaging yesterday. Electronically Signed   By: Keith Rake M.D.   On: 03/16/2020 01:24   Scheduled Meds: . sodium chloride   Intravenous Once  . diltiazem  90 mg Oral Q6H  . metoprolol tartrate  50 mg Oral BID  . sodium chloride flush  3 mL Intravenous Q12H   Continuous Infusions: . cefTRIAXone (ROCEPHIN)  IV 2 g (03/16/20 1753)     LOS: 2 days    Time spent: 75mn  PDomenic Polite MD [facility]  03/17/2020, 7:30 AM

## 2020-03-17 NOTE — Progress Notes (Signed)
Patient ID: Hannah Khan, female   DOB: 1955/10/13, 64 y.o.   MRN: [id number]         [facility]  Date of Admission:  03/14/2020   Total days of antibiotics 4        Day 2 ceftriaxone         ASSESSMENT: She is improving on therapy for streptococcus mitis/oralis bacteremia. Repeat blood cultures and TTE results are pending. If there is no evidence of endocarditis on TTE we will convert her to oral amoxicillin to complete a total of 14 days of antibiotic therapy. I told her that she should undergo colonoscopy in the next few months because of the association with Streptococcus mitis/oralis bacteremia with colonic malignancy.  PLAN: 1. Continue ceftriaxone pending results of repeat blood cultures and TTE  Principal Problem:   Streptococcal bacteremia Active Problems:   Obstructive sleep apnea   Morbid obesity-   Acute on chronic combined systolic and diastolic CHF (congestive heart failure) (HCC)   Type 2 diabetes mellitus without complication, without long-term current use of insulin (HCC)   Liver cirrhosis secondary to NASH (nonalcoholic steatohepatitis) (HCC)   Hyperlipidemia   A-fib (HCC)   Chronic venous stasis dermatitis of both lower extremities   Scheduled Meds: . sodium chloride   Intravenous Once  . bumetanide  2 mg Oral BID  . diltiazem  360 mg Oral Daily  . metoprolol tartrate  50 mg Oral BID  . sodium chloride flush  3 mL Intravenous Q12H  . spironolactone  25 mg Oral Daily   Continuous Infusions: . cefTRIAXone (ROCEPHIN)  IV 2 g (03/16/20 1753)   PRN Meds:.ondansetron **OR** ondansetron (ZOFRAN) IV, traMADol   SUBJECTIVE: She says that she is feeling much better than she was upon admission.  Review of Systems: Review of Systems  Constitutional: Negative for chills, diaphoresis and fever.  Respiratory: Positive for shortness of breath. Negative for cough.   Cardiovascular: Negative for chest pain.  Genitourinary: Negative for  dysuria.    Allergies  Allergen Reactions  . Celecoxib Other (See Comments)    "speech slurred" with celebrex  . Shellfish Allergy Swelling and Rash    TINGLING AND SWELLING OF LIPS. LARGE WHELPS QUARTER-SIZE  . Barium-Containing Compounds Other (See Comments)    TACHYCARDIA  . Prednisone Other (See Comments)    TACHYCARDIA  . Statins Other (See Comments)    AGGRAVATED FIBROMYALGIA  . Fentanyl     Hallucinations   . Ibuprofen Other (See Comments)    UNSPECIFIED SPECIFIC REACTION >> "DUE TO PLATELETS"  . Tramadol     Hallucination   . Tylenol [Acetaminophen] Other (See Comments)    UNSPECIFIED SPECIFIC REACTION >> "DUE TO PLATELETS"    OBJECTIVE: Vitals:   03/17/20 0000 03/17/20 0356 03/17/20 0610 03/17/20 0852  BP: 121/70 (!) 127/50 115/66 (!) 114/45  Pulse:  85    Resp:  20    Temp:  98 F (36.7 C)    TempSrc:  Oral    SpO2:  96%    Weight:  (!) 169.6 kg    Height:       Body mass index is 58.55 kg/m.  Physical Exam Constitutional:      Comments: She is resting quietly in bed wearing her CPAP mask.  Cardiovascular:     Rate and Rhythm: Tachycardia present. Rhythm irregular.     Heart sounds: No murmur heard.   Pulmonary:     Effort: Pulmonary effort is normal.  Breath sounds: Normal breath sounds.  Abdominal:     Palpations: Abdomen is soft.     Tenderness: There is no abdominal tenderness.  Musculoskeletal:        General: No swelling or tenderness.  Skin:    Findings: No rash.     Comments: No change in small scabbed areas on the right shin, lower abdomen or right shoulder.  Psychiatric:        Mood and Affect: Mood normal.     Lab Results Lab Results  Component Value Date   WBC 5.5 03/17/2020   HGB 11.8 (L) 03/17/2020   HCT 35.8 (L) 03/17/2020   MCV 93.5 03/17/2020   PLT 50 (L) 03/17/2020    Lab Results  Component Value Date   CREATININE 0.83 03/17/2020   BUN 23 03/17/2020   NA 135 03/17/2020   K 3.6 03/17/2020   CL 102  03/17/2020   CO2 21 (L) 03/17/2020    Lab Results  Component Value Date   ALT 33 03/16/2020   AST 62 (H) 03/16/2020   ALKPHOS 51 03/16/2020   BILITOT 6.1 (H) 03/16/2020     Microbiology: Recent Results (from the past 240 hour(s))  Blood Culture (routine x 2)     Status: Abnormal (Preliminary result)   Collection Time: 03/15/20 12:33 AM   Specimen: BLOOD  Result Value Ref Range Status   Specimen Description BLOOD RIGHT ANTECUBITAL  Final   Special Requests   Final    BOTTLES DRAWN AEROBIC AND ANAEROBIC Blood Culture adequate volume   Culture  Setup Time   Final    GRAM POSITIVE COCCI IN CHAINS IN BOTH AEROBIC AND ANAEROBIC BOTTLES CRITICAL RESULT CALLED TO, READ BACK BY AND VERIFIED WITH: [place] XFGHWE [id number] FCP Performed at [facility], [place] [address] [place], [place] [address]    Culture STREPTOCOCCUS MITIS/ORALIS (A)  Final   Report Status PENDING  Incomplete  Blood Culture ID Panel (Reflexed)     Status: Abnormal   Collection Time: 03/15/20 12:33 AM  Result Value Ref Range Status   Enterococcus species NOT DETECTED NOT DETECTED Final   Listeria monocytogenes NOT DETECTED NOT DETECTED Final   Staphylococcus species NOT DETECTED NOT DETECTED Final   Staphylococcus aureus (BCID) NOT DETECTED NOT DETECTED Final   Streptococcus species DETECTED (A) NOT DETECTED Final    Comment: Not Enterococcus species, Streptococcus agalactiae, Streptococcus pyogenes, or Streptococcus pneumoniae. CRITICAL RESULT CALLED TO, READ BACK BY AND VERIFIED WITH: PHARMD CATHY PIERCE [id number] [id number] FCP    Streptococcus agalactiae NOT DETECTED NOT DETECTED Final   Streptococcus pneumoniae NOT DETECTED NOT DETECTED Final   Streptococcus pyogenes NOT DETECTED NOT DETECTED Final   Acinetobacter baumannii NOT DETECTED NOT DETECTED Final   Enterobacteriaceae species NOT DETECTED NOT DETECTED Final   Enterobacter cloacae complex NOT DETECTED NOT DETECTED Final   Escherichia coli NOT  DETECTED NOT DETECTED Final   Klebsiella oxytoca NOT DETECTED NOT DETECTED Final   Klebsiella pneumoniae NOT DETECTED NOT DETECTED Final   Proteus species NOT DETECTED NOT DETECTED Final   Serratia marcescens NOT DETECTED NOT DETECTED Final   Haemophilus influenzae NOT DETECTED NOT DETECTED Final   Neisseria meningitidis NOT DETECTED NOT DETECTED Final   Pseudomonas aeruginosa NOT DETECTED NOT DETECTED Final   Candida albicans NOT DETECTED NOT DETECTED Final   Candida glabrata NOT DETECTED NOT DETECTED Final   Candida krusei NOT DETECTED NOT DETECTED Final   Candida parapsilosis NOT DETECTED NOT DETECTED Final  Candida tropicalis NOT DETECTED NOT DETECTED Final    Comment: Performed at [facility], [place] [address], [place], [place] [address]  Blood Culture (routine x 2)     Status: Abnormal (Preliminary result)   Collection Time: 03/15/20 12:35 AM   Specimen: BLOOD RIGHT HAND  Result Value Ref Range Status   Specimen Description BLOOD RIGHT HAND  Final   Special Requests   Final    BOTTLES DRAWN AEROBIC AND ANAEROBIC Blood Culture adequate volume   Culture  Setup Time   Final    GRAM POSITIVE COCCI IN BOTH AEROBIC AND ANAEROBIC BOTTLES CRITICAL VALUE NOTED.  VALUE IS CONSISTENT WITH PREVIOUSLY REPORTED AND CALLED VALUE. Performed at [facility], [place] [address], [place], [place] [address]    Culture STREPTOCOCCUS MITIS/ORALIS (A)  Final   Report Status PENDING  Incomplete  Urine culture     Status: Abnormal   Collection Time: 03/15/20  1:20 AM   Specimen: Urine, Random  Result Value Ref Range Status   Specimen Description URINE, RANDOM  Final   Special Requests   Final    NONE Performed at [facility], [place] [address] [place], [place] [address]    Culture MULTIPLE SPECIES PRESENT, SUGGEST RECOLLECTION (A)  Final   Report Status 03/16/2020 FINAL  Final  SARS Coronavirus 2 by RT PCR (hospital order, performed in [facility] hospital lab) Nasopharyngeal  Nasopharyngeal Swab     Status: None   Collection Time: 03/15/20  2:12 AM   Specimen: Nasopharyngeal Swab  Result Value Ref Range Status   SARS Coronavirus 2 NEGATIVE NEGATIVE Final    Comment: (NOTE) SARS-CoV-2 target nucleic acids are NOT DETECTED.  The SARS-CoV-2 RNA is generally detectable in upper and lower respiratory specimens during the acute phase of infection. The lowest concentration of SARS-CoV-2 viral copies this assay can detect is 250 copies / mL. A negative result does not preclude SARS-CoV-2 infection and should not be used as the sole basis for treatment or other patient management decisions.  A negative result may occur with improper specimen collection / handling, submission of specimen other than nasopharyngeal swab, presence of viral mutation(s) within the areas targeted by this assay, and inadequate number of viral copies (<250 copies / mL). A negative result must be combined with clinical observations, patient history, and epidemiological information.  Fact Sheet for Patients:   StrictlyIdeas.no  Fact Sheet for Healthcare Providers: BankingDealers.co.za  This test is not yet approved or  cleared by the [place] FDA and has been authorized for detection and/or diagnosis of SARS-CoV-2 by FDA under an Emergency Use Authorization (EUA).  This EUA will remain in effect (meaning this test can be used) for the duration of the COVID-19 declaration under Section 564(b)(1) of the Act, 21 U.S.C. section 360bbb-3(b)(1), unless the authorization is terminated or revoked sooner.  Performed at [facility], [place] [address], [place], [place] [address]   Culture, blood (routine x 2)     Status: None (Preliminary result)   Collection Time: 03/16/20 11:08 AM   Specimen: BLOOD RIGHT HAND  Result Value Ref Range Status   Specimen Description BLOOD RIGHT HAND  Final   Special Requests   Final    BOTTLES DRAWN AEROBIC  AND ANAEROBIC Blood Culture adequate volume   Culture   Final    NO GROWTH < 24 HOURS Performed at [facility], [place] [address] [place], [place] [address]    Report Status  PENDING  Incomplete  Culture, blood (routine x 2)     Status: None (Preliminary result)   Collection Time: 03/16/20 11:13 AM   Specimen: BLOOD LEFT HAND  Result Value Ref Range Status   Specimen Description BLOOD LEFT HAND  Final   Special Requests   Final    BOTTLES DRAWN AEROBIC AND ANAEROBIC Blood Culture adequate volume   Culture   Final    NO GROWTH < 24 HOURS Performed at [facility], [place] [address], [place], [place] [address]    Report Status PENDING  Incomplete    Michel Bickers, MD [facility] [place] [phone] pager   [phone] cell 03/17/2020, 11:08 AM

## 2020-03-17 NOTE — Progress Notes (Signed)
Echocardiogram 2D Echocardiogram with definity has been performed.  Darlina Sicilian M 03/17/2020, 1:41 PM

## 2020-03-17 NOTE — Progress Notes (Signed)
Progress Note  Patient Name: Hannah Khan Date of Encounter: 03/17/2020  [facility] Cardiologist: Skeet Latch, MD   Subjective   Denies CP; Some DOE  Inpatient Medications    Scheduled Meds: . sodium chloride   Intravenous Once  . diltiazem  90 mg Oral Q6H  . metoprolol tartrate  50 mg Oral BID  . sodium chloride flush  3 mL Intravenous Q12H   Continuous Infusions: . cefTRIAXone (ROCEPHIN)  IV 2 g (03/16/20 1753)   PRN Meds: ondansetron **OR** ondansetron (ZOFRAN) IV, traMADol   Vital Signs    Vitals:   03/16/20 2213 03/17/20 0000 03/17/20 0356 03/17/20 0610  BP: 128/63 121/70 (!) 127/50 115/66  Pulse: (!) 103  85   Resp:   20   Temp:   98 F (36.7 C)   TempSrc:   Oral   SpO2:   96%   Weight:   (!) 169.6 kg   Height:        Intake/Output Summary (Last 24 hours) at 03/17/2020 0741 Last data filed at 03/17/2020 0300 Gross per 24 hour  Intake 1523 ml  Output 1600 ml  Net -77 ml   Last 3 Weights 03/17/2020 03/16/2020 03/15/2020  Weight (lbs) 373 lb 12.8 oz 380 lb 4.8 oz 363 lb 12.1 oz  Weight (kg) 169.555 kg 172.503 kg 165 kg      Telemetry    Atrial fibrillation, rate improved- Personally Reviewed  Physical Exam   GEN: NAD.  Morbidly obese Neck: No JVD, supple Cardiac: irregular, no murmur Respiratory: CTA anteriorly  GI: Soft, obese MS: 1+ ankle edema; chronic skin changes Neuro:  Grossly intact Psych: Normal affect   Labs    High Sensitivity Troponin:   Recent Labs  Lab 03/15/20 0841  TROPONINIHS 11      Chemistry Recent Labs  Lab 03/15/20 0033 03/15/20 0841 03/16/20 0219  NA 137 137 136  K 3.3* 4.1 3.6  CL 107 104 106  CO2 22 19* 21*  GLUCOSE 216* 205* 179*  BUN 15 16 21   CREATININE 0.87 0.88 0.83  CALCIUM 9.3 8.9 8.6*  PROT  --   --  5.6*  ALBUMIN  --   --  2.7*  AST  --   --  62*  ALT  --   --  33  ALKPHOS  --   --  51  BILITOT  --   --  6.1*  GFRNONAA >60 >60 >60  GFRAA >60 >60 >60  ANIONGAP 8 14 9       Hematology Recent Labs  Lab 03/15/20 0033 03/16/20 0219  WBC 8.7 5.6  RBC 4.08 3.63*  HGB 12.4 11.1*  HCT 37.8 33.7*  MCV 92.6 92.8  MCH 30.4 30.6  MCHC 32.8 32.9  RDW 14.2 14.9  PLT 32* 31*    BNP Recent Labs  Lab 03/15/20 0033  BNP 340.2*     DDimer  Recent Labs  Lab 03/15/20 0033 03/16/20 0219  DDIMER 2.35* 2.64*     Radiology    CT ABDOMEN PELVIS W CONTRAST  Result Date: 03/16/2020 CLINICAL DATA:  Acute generalized abdominal pain. EXAM: CT ABDOMEN AND PELVIS WITH CONTRAST TECHNIQUE: Multidetector CT imaging of the abdomen and pelvis was performed using the standard protocol following bolus administration of intravenous contrast. CONTRAST:  176m OMNIPAQUE IOHEXOL 300 MG/ML  SOLN COMPARISON:  July 10, 2016. FINDINGS: Lower chest: No acute abnormality. Hepatobiliary: Status post cholecystectomy. No biliary dilatation is noted. Nodular hepatic contours are noted suggesting hepatic  cirrhosis. Minimal amount of fluid is noted adjacent to the liver. Pancreas: Unremarkable. No pancreatic ductal dilatation or surrounding inflammatory changes. Spleen: Mild splenomegaly is noted. Adrenals/Urinary Tract: Adrenal glands appear normal. No hydronephrosis or renal obstruction is noted. No renal or ureteral calculi are noted. Urinary bladder is decompressed. Stable 1.8 cm low density is noted in lower pole of left kidney most consistent with cyst. There is interval development of new low densities throughout both kidneys, several of which appear to be exophytic or partially exophytic on the right. These most likely represent cysts, but ultrasound is recommended for confirmation and to rule out mass. Stomach/Bowel: The stomach appears normal. There is no evidence of bowel obstruction or inflammation. The appendix is not clearly visualized, but no inflammation is noted in the right lower quadrant. Vascular/Lymphatic: No adenopathy is noted. Multiple enlarged collateral veins are noted in  the porta hepatis region as well as in the splenic hilum. Enlarged paraesophageal varices are noted as well. There is no evidence of abdominal aortic aneurysm. Reproductive: Intrauterine device is noted in the lower portion of the uterus. No adnexal abnormality is noted. Other: No abdominal wall hernia or abnormality. No abdominopelvic ascites. Musculoskeletal: No acute or significant osseous findings. IMPRESSION: 1. Findings consistent with hepatic cirrhosis with mild splenomegaly and multiple enlarged collateral veins are noted in the porta hepatis region as well as in the splenic hilum. Enlarged paraesophageal varices are noted as well. Minimal perihepatic ascites is noted. 2. Interval development of new low densities throughout both kidneys, several of which appear to be exophytic or partially exophytic on the right. These most likely represent cysts, but ultrasound is recommended for confirmation and to rule out mass. 3. No hydronephrosis or renal obstruction is noted. No renal or ureteral calculi are noted. 4. Intrauterine device is noted in the lower portion of the uterus. Electronically Signed   By: Marijo Conception M.D.   On: 03/16/2020 08:52   DG CHEST PORT 1 VIEW  Result Date: 03/16/2020 CLINICAL DATA:  Tachycardia EXAM: PORTABLE CHEST 1 VIEW COMPARISON:  03/16/2020 FINDINGS: Heart size likely enlarged and accentuated by portable technique not changed from the previous study. Mediastinal contours and hilar structures with similar appearance. Central pulmonary vascular engorgement suggested. Limited assessment of the LEFT lung base. Linear atelectasis suspected in the LEFT lower lobe. Skeletal structures on limited assessment are unremarkable. IMPRESSION: Mild cardiomegaly with vascular engorgement. LEFT lower lobe atelectasis. Electronically Signed   By: Zetta Bills M.D.   On: 03/16/2020 15:32   DG CHEST PORT 1 VIEW  Result Date: 03/16/2020 CLINICAL DATA:  Chest pain. EXAM: PORTABLE CHEST 1 VIEW  COMPARISON:  Radiograph and CT a ester day. FINDINGS: Stable cardiomegaly. No acute airspace disease. No pleural fluid, pulmonary edema, or pneumothorax. Stable osseous structures. IMPRESSION: Stable cardiomegaly without acute abnormality. No change from imaging yesterday. Electronically Signed   By: Keith Rake M.D.   On: 03/16/2020 01:24    Patient Profile     64 y.o. female with past medical history of chronic diastolic congestive heart failure, persistent atrial fibrillation not on anticoagulation given history of thrombocytopenia with liver cirrhosis and uterine bleeding, diabetes mellitus, obstructive sleep apnea we are evaluating for atrial fibrillation with rapid ventricular response.  Last echocardiogram June 2019 showed normal LV function and mild right side enlargement.  Assessment & Plan    1 persistent atrial fibrillation-heart rate improved today.  Change Cardizem to 360 mg daily.  Continue metoprolol at present dose.  No anticoagulation given  history of cirrhosis, thrombocytopenia and uterine bleeding.    2 bacteremia/sepsis-Antibiotics per primary care.  3 chronic diastolic congestive heart failure-patient describes some increased dyspnea.  Will resume Bumex at home dose which is 2 mg twice daily.  Resume spironolactone 25 mg daily.  4 cirrhosis/NASH/chronic thrombocytopenia-as above patient is not a candidate for anticoagulation.  5 morbid obesity  6 Renal cysts-eval per primary care.  Patient can be discharged from a cardiac standpoint.  Would arrange follow-up with APP 2 to 4 weeks.  Check potassium and renal function in 1 week.  Follow-up Dr. Oval Linsey 3 months.  Please call with questions.  For questions or updates, please contact [place] Please consult [email or web address] for contact info under        Signed, Kirk Ruths, MD  03/17/2020, 7:41 AM

## 2020-03-18 LAB — BASIC METABOLIC PANEL
Anion gap: 8 (ref 5–15)
BUN: 16 mg/dL (ref 8–23)
CO2: 25 mmol/L (ref 22–32)
Calcium: 8.3 mg/dL — ABNORMAL LOW (ref 8.9–10.3)
Chloride: 104 mmol/L (ref 98–111)
Creatinine, Ser: 0.73 mg/dL (ref 0.44–1.00)
GFR calc Af Amer: >60 mL/min (ref >60)
GFR calc non Af Amer: >60 mL/min (ref >60)
Glucose, Bld: 171 mg/dL — ABNORMAL HIGH (ref 70–99)
Potassium: 3.4 mmol/L — ABNORMAL LOW (ref 3.5–5.1)
Sodium: 137 mmol/L (ref 135–145)

## 2020-03-18 LAB — CBC
HCT: 32.2 % — ABNORMAL LOW (ref 36.0–46.0)
Hemoglobin: 10.8 g/dL — ABNORMAL LOW (ref 12.0–15.0)
MCH: 30.8 pg (ref 26.0–34.0)
MCHC: 33.5 g/dL (ref 30.0–36.0)
MCV: 91.7 fL (ref 80.0–100.0)
Platelets: 45 10*3/uL — ABNORMAL LOW (ref 150–400)
RBC: 3.51 MIL/uL — ABNORMAL LOW (ref 3.87–5.11)
RDW: 14.6 % (ref 11.5–15.5)
WBC: 3.2 10*3/uL — ABNORMAL LOW (ref 4.0–10.5)
nRBC: 0 % (ref 0.0–0.2)

## 2020-03-18 MED ORDER — AMOXICILLIN 500 MG PO TABS: 500.0000 mg | ORAL_TABLET | Freq: Three times a day (TID) | ORAL | 0 refills | Status: AC

## 2020-03-18 MED ORDER — SACCHAROMYCES BOULARDII 250 MG PO CAPS
250.0000 mg | ORAL_CAPSULE | Freq: Two times a day (BID) | ORAL | 1 refills | Status: DC
Start: 2020-03-18 — End: 2020-07-01

## 2020-03-18 MED ORDER — SACCHAROMYCES BOULARDII 250 MG PO CAPS
250.0000 mg | ORAL_CAPSULE | Freq: Two times a day (BID) | ORAL | 1 refills | Status: DC
Start: 2020-03-18 — End: 2020-03-18

## 2020-03-18 MED ORDER — DILTIAZEM HCL ER COATED BEADS 360 MG PO CP24: 360.0000 mg | ORAL_CAPSULE | Freq: Every day | ORAL | 4 refills | Status: DC

## 2020-03-18 MED ORDER — METOPROLOL TARTRATE 50 MG PO TABS: 50.0000 mg | ORAL_TABLET | Freq: Two times a day (BID) | ORAL | 2 refills | Status: DC

## 2020-03-18 MED ORDER — PROPRANOLOL HCL 10 MG PO TABS: 10.0000 mg | ORAL_TABLET | Freq: Two times a day (BID) | ORAL | 3 refills | Status: DC

## 2020-03-18 MED FILL — AMOXICILLIN 500 MG CAPS: 500 | 9 days supply | Qty: 27 | Fill #0

## 2020-03-18 NOTE — Progress Notes (Signed)
Patient ID: Hannah Khan, female   DOB: [birth year], 64 y.o.   MRN: [id number]         [facility]  Date of Admission:  03/14/2020   Total days of antibiotics 5        Day 3 ceftriaxone         ASSESSMENT: She has improved on therapy for her Streptococcus bacteremia.  Repeat blood cultures are negative and there was no evidence of endocarditis on TTE.  I recommend transitioning her to oral amoxicillin home today.  PLAN: 1. Change ceftriaxone to oral amoxicillin 500 mg 3 times a day for 9 days (this will be delivered to her before discharge from the transitions of care pharmacy)  Principal Problem:   Streptococcal bacteremia Active Problems:   Obstructive sleep apnea   Morbid obesity-   Acute on chronic combined systolic and diastolic CHF (congestive heart failure) (HCC)   Type 2 diabetes mellitus without complication, without long-term current use of insulin (HCC)   Liver cirrhosis secondary to NASH (nonalcoholic steatohepatitis) (HCC)   Hyperlipidemia   A-fib (HCC)   Chronic venous stasis dermatitis of both lower extremities   Scheduled Meds: . sodium chloride   Intravenous Once  . bumetanide  2 mg Oral BID  . diltiazem  360 mg Oral Daily  . metoprolol tartrate  50 mg Oral BID  . sodium chloride flush  3 mL Intravenous Q12H  . spironolactone  25 mg Oral Daily   Continuous Infusions: . cefTRIAXone (ROCEPHIN)  IV 2 g (03/17/20 1630)   PRN Meds:.ondansetron **OR** ondansetron (ZOFRAN) IV, traMADol   SUBJECTIVE: She says that she is feeling much better but became worried about a red spot on her right lower abdomen this morning.  It itches.  Review of Systems: Review of Systems  Constitutional: Negative for chills, diaphoresis and fever.  Respiratory: Positive for shortness of breath. Negative for cough.   Cardiovascular: Negative for chest pain.  Genitourinary: Negative for dysuria.  Skin: Positive for itching and rash.    Allergies    Allergen Reactions  . Celecoxib Other (See Comments)    "speech slurred" with celebrex  . Shellfish Allergy Swelling and Rash    TINGLING AND SWELLING OF LIPS. LARGE WHELPS QUARTER-SIZE  . Barium-Containing Compounds Other (See Comments)    TACHYCARDIA  . Prednisone Other (See Comments)    TACHYCARDIA  . Statins Other (See Comments)    AGGRAVATED FIBROMYALGIA  . Fentanyl     Hallucinations   . Ibuprofen Other (See Comments)    UNSPECIFIED SPECIFIC REACTION >> "DUE TO PLATELETS"  . Tramadol     Hallucination   . Tylenol [Acetaminophen] Other (See Comments)    UNSPECIFIED SPECIFIC REACTION >> "DUE TO PLATELETS"    OBJECTIVE: Vitals:   03/17/20 1945 03/17/20 2139 03/18/20 0006 03/18/20 0451  BP: (!) 111/59 120/68 110/80 116/60  Pulse: 100 (!) 115 90 95  Resp: 18  20 18   Temp: 97.8 F (36.6 C)  98.3 F (36.8 C) 98.1 F (36.7 C)  TempSrc: Oral  Oral Oral  SpO2: 98%  99% 98%  Weight:    (!) 168.7 kg  Height:       Body mass index is 58.25 kg/m.  Physical Exam Constitutional:      Comments: She is resting quietly in bed wearing her CPAP mask.  Cardiovascular:     Rate and Rhythm: Tachycardia present. Rhythm irregular.     Heart sounds: No murmur heard.   Pulmonary:  Effort: Pulmonary effort is normal.     Breath sounds: Normal breath sounds.  Abdominal:     Palpations: Abdomen is soft.     Tenderness: There is no abdominal tenderness.     Comments: There is a small area of redness on her right lower abdomen where she has been scratching.  Musculoskeletal:        General: No swelling or tenderness.  Skin:    Findings: No rash.     Comments: No change in small scabbed areas on the right shin, lower abdomen or right shoulder.  Psychiatric:        Mood and Affect: Mood normal.     Lab Results Lab Results  Component Value Date   WBC 3.2 (L) 03/18/2020   HGB 10.8 (L) 03/18/2020   HCT 32.2 (L) 03/18/2020   MCV 91.7 03/18/2020   PLT 45 (L) 03/18/2020     Lab Results  Component Value Date   CREATININE 0.73 03/18/2020   BUN 16 03/18/2020   NA 137 03/18/2020   K 3.4 (L) 03/18/2020   CL 104 03/18/2020   CO2 25 03/18/2020    Lab Results  Component Value Date   ALT 33 03/16/2020   AST 62 (H) 03/16/2020   ALKPHOS 51 03/16/2020   BILITOT 6.1 (H) 03/16/2020     Microbiology: Recent Results (from the past 240 hour(s))  Blood Culture (routine x 2)     Status: Abnormal   Collection Time: 03/15/20 12:33 AM   Specimen: BLOOD  Result Value Ref Range Status   Specimen Description BLOOD RIGHT ANTECUBITAL  Final   Special Requests   Final    BOTTLES DRAWN AEROBIC AND ANAEROBIC Blood Culture adequate volume   Culture  Setup Time   Final    GRAM POSITIVE COCCI IN CHAINS IN BOTH AEROBIC AND ANAEROBIC BOTTLES CRITICAL RESULT CALLED TO, READ BACK BY AND VERIFIED WITH: Start [id number] FCP Performed at [facility], [place] [address] [place], [place] [address]    Culture STREPTOCOCCUS MITIS/ORALIS (A)  Final   Report Status 03/17/2020 FINAL  Final   Organism ID, Bacteria STREPTOCOCCUS MITIS/ORALIS  Final      Susceptibility   Streptococcus mitis/oralis - MIC*    TETRACYCLINE 0.5 SENSITIVE Sensitive     VANCOMYCIN 0.5 SENSITIVE Sensitive     CLINDAMYCIN <=0.25      PENICILLIN Value in next row Sensitive      SENSITIVE0.06    CEFTAZIDIME Value in next row Sensitive      SENSITIVE0.12    * STREPTOCOCCUS MITIS/ORALIS  Blood Culture ID Panel (Reflexed)     Status: Abnormal   Collection Time: 03/15/20 12:33 AM  Result Value Ref Range Status   Enterococcus species NOT DETECTED NOT DETECTED Final   Listeria monocytogenes NOT DETECTED NOT DETECTED Final   Staphylococcus species NOT DETECTED NOT DETECTED Final   Staphylococcus aureus (BCID) NOT DETECTED NOT DETECTED Final   Streptococcus species DETECTED (A) NOT DETECTED Final    Comment: Not Enterococcus species, Streptococcus agalactiae, Streptococcus pyogenes, or  Streptococcus pneumoniae. CRITICAL RESULT CALLED TO, READ BACK BY AND VERIFIED WITH: PHARMD CATHY PIERCE [id number] FCP    Streptococcus agalactiae NOT DETECTED NOT DETECTED Final   Streptococcus pneumoniae NOT DETECTED NOT DETECTED Final   Streptococcus pyogenes NOT DETECTED NOT DETECTED Final   Acinetobacter baumannii NOT DETECTED NOT DETECTED Final   Enterobacteriaceae species NOT DETECTED NOT DETECTED Final   Enterobacter cloacae complex NOT DETECTED NOT DETECTED  Final   Escherichia coli NOT DETECTED NOT DETECTED Final   Klebsiella oxytoca NOT DETECTED NOT DETECTED Final   Klebsiella pneumoniae NOT DETECTED NOT DETECTED Final   Proteus species NOT DETECTED NOT DETECTED Final   Serratia marcescens NOT DETECTED NOT DETECTED Final   Haemophilus influenzae NOT DETECTED NOT DETECTED Final   Neisseria meningitidis NOT DETECTED NOT DETECTED Final   Pseudomonas aeruginosa NOT DETECTED NOT DETECTED Final   Candida albicans NOT DETECTED NOT DETECTED Final   Candida glabrata NOT DETECTED NOT DETECTED Final   Candida krusei NOT DETECTED NOT DETECTED Final   Candida parapsilosis NOT DETECTED NOT DETECTED Final   Candida tropicalis NOT DETECTED NOT DETECTED Final    Comment: Performed at [facility], [place] [address] [place], [place] [address]  Blood Culture (routine x 2)     Status: Abnormal   Collection Time: 03/15/20 12:35 AM   Specimen: BLOOD RIGHT HAND  Result Value Ref Range Status   Specimen Description BLOOD RIGHT HAND  Final   Special Requests   Final    BOTTLES DRAWN AEROBIC AND ANAEROBIC Blood Culture adequate volume   Culture  Setup Time   Final    GRAM POSITIVE COCCI IN BOTH AEROBIC AND ANAEROBIC BOTTLES CRITICAL VALUE NOTED.  VALUE IS CONSISTENT WITH PREVIOUSLY REPORTED AND CALLED VALUE.    Culture (A)  Final    STREPTOCOCCUS MITIS/ORALIS SUSCEPTIBILITIES PERFORMED ON PREVIOUS CULTURE WITHIN THE LAST 5 DAYS. Performed at [facility], [place] [address],  [place], [place] [address]    Report Status 03/17/2020 FINAL  Final  Urine culture     Status: Abnormal   Collection Time: 03/15/20  1:20 AM   Specimen: Urine, Random  Result Value Ref Range Status   Specimen Description URINE, RANDOM  Final   Special Requests   Final    NONE Performed at [facility], [place] [address], [place], [place] [address]    Culture MULTIPLE SPECIES PRESENT, SUGGEST RECOLLECTION (A)  Final   Report Status 03/16/2020 FINAL  Final  SARS Coronavirus 2 by RT PCR (hospital order, performed in [facility] hospital lab) Nasopharyngeal Nasopharyngeal Swab     Status: None   Collection Time: 03/15/20  2:12 AM   Specimen: Nasopharyngeal Swab  Result Value Ref Range Status   SARS Coronavirus 2 NEGATIVE NEGATIVE Final    Comment: (NOTE) SARS-CoV-2 target nucleic acids are NOT DETECTED.  The SARS-CoV-2 RNA is generally detectable in upper and lower respiratory specimens during the acute phase of infection. The lowest concentration of SARS-CoV-2 viral copies this assay can detect is 250 copies / mL. A negative result does not preclude SARS-CoV-2 infection and should not be used as the sole basis for treatment or other patient management decisions.  A negative result may occur with improper specimen collection / handling, submission of specimen other than nasopharyngeal swab, presence of viral mutation(s) within the areas targeted by this assay, and inadequate number of viral copies (<250 copies / mL). A negative result must be combined with clinical observations, patient history, and epidemiological information.  Fact Sheet for Patients:   StrictlyIdeas.no  Fact Sheet for Healthcare Providers: BankingDealers.co.za  This test is not yet approved or  cleared by the [place] FDA and has been authorized for detection and/or diagnosis of SARS-CoV-2 by FDA under an Emergency Use Authorization (EUA).  This EUA will  remain in effect (meaning this test can be used) for the duration of the COVID-19 declaration under Section  564(b)(1) of the Act, 21 U.S.C. section 360bbb-3(b)(1), unless the authorization is terminated or revoked sooner.  Performed at [facility], [place] [address] [place], [place] [address]   Culture, blood (routine x 2)     Status: None (Preliminary result)   Collection Time: 03/16/20 11:08 AM   Specimen: BLOOD RIGHT HAND  Result Value Ref Range Status   Specimen Description BLOOD RIGHT HAND  Final   Special Requests   Final    BOTTLES DRAWN AEROBIC AND ANAEROBIC Blood Culture adequate volume   Culture   Final    NO GROWTH 2 DAYS Performed at [facility], [place] [address], [place], [place] [address]    Report Status PENDING  Incomplete  Culture, blood (routine x 2)     Status: None (Preliminary result)   Collection Time: 03/16/20 11:13 AM   Specimen: BLOOD LEFT HAND  Result Value Ref Range Status   Specimen Description BLOOD LEFT HAND  Final   Special Requests   Final    BOTTLES DRAWN AEROBIC AND ANAEROBIC Blood Culture adequate volume   Culture   Final    NO GROWTH 2 DAYS Performed at [facility], [place] [address] [place], [place] [address]    Report Status PENDING  Incomplete    Michel Bickers, MD [facility] [place] [facility] [phone] pager   [phone] cell 03/18/2020, 11:08 AM

## 2020-03-18 NOTE — Discharge Summary (Signed)
Physician Discharge Summary  Hannah Khan GOT:[id number] DOB: Aug 06, 1956 DOA: 03/14/2020  PCP: Lujean Amel, MD  Admit date: 03/14/2020 Discharge date: 03/18/2020  Admitted From: Home Disposition: Home  Recommendations for Outpatient Follow-up:  1. Follow up with PCP in 1-2 weeks 2. Please obtain BMP/CBC in one week 3. Please follow-up with the GI doctor for colonoscopy since she had Streptococcus mitis bacteremia which is associated with colon cancer.  Home Health: None Equipment/Devices none  Discharge Condition stable CODE STATUS: Full code Diet recommendation: Cardiac Brief/Interim Summary:Hannah S Johnsonis a 64 y.o.femalewith medical history significant of with a hx of morbid obesity with a BMI of 57, cirrhosis with thrombocytopenia, chronic atrial fibrillation not on anticoagulation, Chronic diastolic CHF, DM 2, hx of dysfunction uterine bleeding, urinary incontinence. Patient presented to ed by EMS with dizziness and tachycardia. -Patient reports seeing a gynecologist recently for DU B, had an IUD placed in May and is scheduled for hysteroscopy in early July. -Patient reports not feeling well for a few weeks, recently with dizziness, palpitations as well as mild right-sided abdominal discomfort.  She denies any dysuria or abnormal pelvic discharge apart from intermittent blood. -In the emergency room her temp was 100.8, heart rate ranged from 240-260 with A. fib, blood pressure was in the 140s and O2 sats were 96% on 2 L.  She had a CT chest which was negative for PE, noted pulmonary hypertension and splenomegaly -Labs noted white count of 8.7, hemoglobin of 12.4, platelet of 32 -Urinalysis positive for blood   Discharge Diagnoses:  Principal Problem:   Streptococcal bacteremia Active Problems:   Obstructive sleep apnea   Morbid obesity-   Acute on chronic combined systolic and diastolic CHF (congestive heart failure) (HCC)   Type 2 diabetes mellitus without  complication, without long-term current use of insulin (HCC)   Liver cirrhosis secondary to NASH (nonalcoholic steatohepatitis) (HCC)   Hyperlipidemia   A-fib (HCC)   Chronic venous stasis dermatitis of both lower extremities    #1 Streptococcus mitis bacteremia followed by ID patient was treated with Rocephin during hospital stay this was changed to amoxicillin 503 times a day for 9 days on discharge.  She is asked to follow-up with GI for colonoscopy as a screening to rule out colon cancer since Streptococcus mitis is associated with colon CA.  #2 paroxysmal A. fib with RVR treated with diltiazem drip initially and then started on metoprolol.  Patient was on nadolol at home which has been stopped.  Patient is not on anticoagulation due to history of cirrhosis uterine bleeding and thrombocytopenia.  #3 history of liver cirrhosis/NASH/chronic thrombocytopenia stable  #4 type 2 diabetes continue home meds on discharge.  #5 obstructive sleep apnea continue CPAP  #6 morbid obesity with a BMI of 56.9.  Needs ongoing diet exercise encouragement.  Continue home dose of Bumex and Aldactone.  Estimated body mass index is 58.25 kg/m as calculated from the following:   Height as of this encounter: 5' 7"  (1.702 m).   Weight as of this encounter: 168.7 kg.  Discharge Instructions  Discharge Instructions    Amb referral to [facility]   Complete by: As directed    Amb referral to [facility] Clinic   Complete by: As directed    Diet - low sodium heart healthy   Complete by: As directed    Increase activity slowly   Complete by: As directed      Allergies as of 03/18/2020      Reactions   Celecoxib  Other (See Comments)   "speech slurred" with celebrex   Shellfish Allergy Swelling, Rash   TINGLING AND SWELLING OF LIPS. LARGE WHELPS QUARTER-SIZE   Barium-containing Compounds Other (See Comments)   TACHYCARDIA   Prednisone Other (See Comments)   TACHYCARDIA   Statins Other (See Comments)    AGGRAVATED FIBROMYALGIA   Fentanyl    Hallucinations    Ibuprofen Other (See Comments)   UNSPECIFIED SPECIFIC REACTION >> "DUE TO PLATELETS"   Tramadol    Hallucination   Tylenol [acetaminophen] Other (See Comments)   UNSPECIFIED SPECIFIC REACTION >> "DUE TO PLATELETS"      Medication List    STOP taking these medications   Klor-Con M20 20 MEQ tablet Generic drug: potassium chloride SA     TAKE these medications   albuterol 108 (90 Base) MCG/ACT inhaler Commonly known as: VENTOLIN HFA Inhale 2 puffs into the lungs every 6 (six) hours as needed for wheezing or shortness of breath.   amoxicillin 500 MG tablet Commonly known as: AMOXIL Take 1 tablet (500 mg total) by mouth 3 (three) times daily for 9 days.   bumetanide 2 MG tablet Commonly known as: BUMEX Take 1 tablet (2 mg total) by mouth 2 (two) times daily.   diltiazem 300 MG 24 hr capsule Commonly known as: CARDIZEM CD TAKE 1 CAPSULE BY MOUTH EVERY DAY What changed: how much to take   Jardiance 25 MG Tabs tablet Generic drug: empagliflozin Take 25 mg by mouth daily after breakfast.   megestrol 20 MG tablet Commonly known as: MEGACE Take 20 mg by mouth daily.   mirabegron ER 50 MG Tb24 tablet Commonly known as: MYRBETRIQ Take 50 mg by mouth daily.   propranolol 10 MG tablet Commonly known as: INDERAL Take 1 tablet (10 mg total) by mouth 2 (two) times daily.   saccharomyces boulardii 250 MG capsule Commonly known as: Florastor Take 1 capsule (250 mg total) by mouth 2 (two) times daily.   spironolactone 25 MG tablet Commonly known as: ALDACTONE TAKE 1 TABLET (25 MG TOTAL) BY MOUTH DAILY. SCHEDULE OV FOR FURTHER REFILLS.       Follow-up Information    Deberah Pelton, NP Follow up on 04/14/2020.   Specialty: Cardiology Why: 8:15AM. Cardiology followup  Contact information: [place] [address] [phone]        Skeet Latch, MD Follow up on 06/17/2020.    Specialty: Cardiology Why: 1:20PM. Cardiology followup Contact information: [address] [place] [address] [phone]        [facility] Follow up.   Specialty: Cardiology Why: obtain BMET in 1 week.  Contact information: [address] [place] [phone]       Lujean Amel, MD Follow up.   Specialty: Family Medicine Contact information: [address] [place] [address] [phone]              Allergies  Allergen Reactions  . Celecoxib Other (See Comments)    "speech slurred" with celebrex  . Shellfish Allergy Swelling and Rash    TINGLING AND SWELLING OF LIPS. LARGE WHELPS QUARTER-SIZE  . Barium-Containing Compounds Other (See Comments)    TACHYCARDIA  . Prednisone Other (See Comments)    TACHYCARDIA  . Statins Other (See Comments)    AGGRAVATED FIBROMYALGIA  . Fentanyl     Hallucinations   . Ibuprofen Other (See Comments)    UNSPECIFIED SPECIFIC REACTION >> "DUE TO PLATELETS"  .  Tramadol     Hallucination   . Tylenol [Acetaminophen] Other (See Comments)    UNSPECIFIED SPECIFIC REACTION >> "DUE TO PLATELETS"    Consultations: Infectious disease and cardiology  Procedures/Studies: CT Angio Chest PE W and/or Wo Contrast  Result Date: 03/15/2020 CLINICAL DATA:  Shortness of breath. Elevated D-dimer. On estrogen replacement therapy. EXAM: CT ANGIOGRAPHY CHEST WITH CONTRAST TECHNIQUE: Multidetector CT imaging of the chest was performed using the standard protocol during bolus administration of intravenous contrast. Multiplanar CT image reconstructions and MIPs were obtained to evaluate the vascular anatomy. CONTRAST:  154m OMNIPAQUE IOHEXOL 350 MG/ML SOLN COMPARISON:  None. FINDINGS: Cardiovascular: Suboptimal opacification of the pulmonary arteries due to bolus timing, limiting evaluation beyond the proximal lobar branches. Within that limitation, there  is no central pulmonary embolus. The size of the main pulmonary artery is enlarged, measuring 3.3 cm. Heart size is normal, with no pericardial effusion. The course and caliber of the aorta are normal. There is no atherosclerotic calcification. Opacification decreased due to pulmonary arterial phase contrast bolus timing. There is an aberrant right subclavian artery. Mediastinum/Nodes: No mediastinal, hilar or axillary lymphadenopathy. Normal visualized thyroid. Thoracic esophageal course is normal. Lungs/Pleura: Airways are patent. No pleural effusion, lobar consolidation, pneumothorax or pulmonary infarction. Upper Abdomen: Contrast bolus timing is not optimized for evaluation of the abdominal organs. The spleen is enlarged measuring 19.7 cm in AP dimension. Musculoskeletal: No chest wall abnormality. No bony spinal canal stenosis. Review of the MIP images confirms the above findings. IMPRESSION: 1. Suboptimal opacification of the pulmonary arteries due to bolus timing, limiting evaluation beyond the proximal lobar branches. Within that limitation, there is no large central pulmonary embolus. 2. Enlarged main pulmonary artery, consistent with pulmonary hypertension. 3. Splenomegaly. Electronically Signed   By: KUlyses JarredM.D.   On: 03/15/2020 02:41   CT ABDOMEN PELVIS W CONTRAST  Result Date: 03/16/2020 CLINICAL DATA:  Acute generalized abdominal pain. EXAM: CT ABDOMEN AND PELVIS WITH CONTRAST TECHNIQUE: Multidetector CT imaging of the abdomen and pelvis was performed using the standard protocol following bolus administration of intravenous contrast. CONTRAST:  1022mOMNIPAQUE IOHEXOL 300 MG/ML  SOLN COMPARISON:  July 10, 2016. FINDINGS: Lower chest: No acute abnormality. Hepatobiliary: Status post cholecystectomy. No biliary dilatation is noted. Nodular hepatic contours are noted suggesting hepatic cirrhosis. Minimal amount of fluid is noted adjacent to the liver. Pancreas: Unremarkable. No pancreatic  ductal dilatation or surrounding inflammatory changes. Spleen: Mild splenomegaly is noted. Adrenals/Urinary Tract: Adrenal glands appear normal. No hydronephrosis or renal obstruction is noted. No renal or ureteral calculi are noted. Urinary bladder is decompressed. Stable 1.8 cm low density is noted in lower pole of left kidney most consistent with cyst. There is interval development of new low densities throughout both kidneys, several of which appear to be exophytic or partially exophytic on the right. These most likely represent cysts, but ultrasound is recommended for confirmation and to rule out mass. Stomach/Bowel: The stomach appears normal. There is no evidence of bowel obstruction or inflammation. The appendix is not clearly visualized, but no inflammation is noted in the right lower quadrant. Vascular/Lymphatic: No adenopathy is noted. Multiple enlarged collateral veins are noted in the porta hepatis region as well as in the splenic hilum. Enlarged paraesophageal varices are noted as well. There is no evidence of abdominal aortic aneurysm. Reproductive: Intrauterine device is noted in the lower portion of the uterus. No adnexal abnormality is noted. Other: No abdominal wall hernia or abnormality. No abdominopelvic ascites. Musculoskeletal: No acute  or significant osseous findings. IMPRESSION: 1. Findings consistent with hepatic cirrhosis with mild splenomegaly and multiple enlarged collateral veins are noted in the porta hepatis region as well as in the splenic hilum. Enlarged paraesophageal varices are noted as well. Minimal perihepatic ascites is noted. 2. Interval development of new low densities throughout both kidneys, several of which appear to be exophytic or partially exophytic on the right. These most likely represent cysts, but ultrasound is recommended for confirmation and to rule out mass. 3. No hydronephrosis or renal obstruction is noted. No renal or ureteral calculi are noted. 4.  Intrauterine device is noted in the lower portion of the uterus. Electronically Signed   By: Marijo Conception M.D.   On: 03/16/2020 08:52   DG CHEST PORT 1 VIEW  Result Date: 03/16/2020 CLINICAL DATA:  Tachycardia EXAM: PORTABLE CHEST 1 VIEW COMPARISON:  03/16/2020 FINDINGS: Heart size likely enlarged and accentuated by portable technique not changed from the previous study. Mediastinal contours and hilar structures with similar appearance. Central pulmonary vascular engorgement suggested. Limited assessment of the LEFT lung base. Linear atelectasis suspected in the LEFT lower lobe. Skeletal structures on limited assessment are unremarkable. IMPRESSION: Mild cardiomegaly with vascular engorgement. LEFT lower lobe atelectasis. Electronically Signed   By: Zetta Bills M.D.   On: 03/16/2020 15:32   DG CHEST PORT 1 VIEW  Result Date: 03/16/2020 CLINICAL DATA:  Chest pain. EXAM: PORTABLE CHEST 1 VIEW COMPARISON:  Radiograph and CT a ester day. FINDINGS: Stable cardiomegaly. No acute airspace disease. No pleural fluid, pulmonary edema, or pneumothorax. Stable osseous structures. IMPRESSION: Stable cardiomegaly without acute abnormality. No change from imaging yesterday. Electronically Signed   By: Keith Rake M.D.   On: 03/16/2020 01:24   DG Chest Port 1 View  Result Date: 03/15/2020 CLINICAL DATA:  Shortness of breath EXAM: PORTABLE CHEST 1 VIEW COMPARISON:  None. FINDINGS: Moderate cardiomegaly. Both lungs are clear. The visualized skeletal structures are unremarkable. IMPRESSION: Moderate cardiomegaly without focal airspace disease. Electronically Signed   By: Ulyses Jarred M.D.   On: 03/15/2020 00:25   ECHOCARDIOGRAM COMPLETE  Result Date: 03/17/2020    ECHOCARDIOGRAM REPORT   Patient Name:   Hannah Khan Date of Exam: 03/17/2020 Medical Rec #:  [id number]       Height:       67.0 in Accession #:    [id number]      Weight:       373.8 lb Date of Birth:  05-22-1956       BSA:          2.638 m  Patient Age:    85 years        BP:           114/45 mmHg Patient Gender: F               HR:           100 bpm. Exam Location:  Inpatient Procedure: 2D Echo Indications:    Bacteremia 790.7 / R78.81  History:        Patient has prior history of Echocardiogram examinations, most                 recent 03/16/2018. Arrythmias:Atrial Fibrillation; Risk                 Factors:Hypertension, Dyslipidemia, Sleep Apnea and Diabetes.  Sonographer:    Darlina Sicilian RDCS Referring Phys: [place]  1. Left ventricular ejection fraction, by estimation, is  55 to 60%. The left ventricle has normal function. The left ventricle has no regional wall motion abnormalities. Left ventricular diastolic function could not be evaluated.  2. Right ventricular systolic function is normal. The right ventricular size is moderately enlarged. There is normal pulmonary artery systolic pressure.  3. The mitral valve is normal in structure. No evidence of mitral valve regurgitation.  4. Unable to determine valve morphology due to image quality. Aortic valve regurgitation is not visualized. No aortic stenosis is present. Conclusion(s)/Recommendation(s): No evidence of valvular vegetations on this transthoracic echocardiogram. Would recommend a transesophageal echocardiogram to exclude infective endocarditis if clinically indicated. Poor technical quality due to body habitus. FINDINGS  Left Ventricle: Left ventricular ejection fraction, by estimation, is 55 to 60%. The left ventricle has normal function. The left ventricle has no regional wall motion abnormalities. Definity contrast agent was given IV to delineate the left ventricular  endocardial borders. The left ventricular internal cavity size was normal in size. There is no left ventricular hypertrophy. Left ventricular diastolic function could not be evaluated due to atrial fibrillation. Left ventricular diastolic function could  not be evaluated. Right Ventricle: The right  ventricular size is moderately enlarged. Right vetricular wall thickness was not assessed. Right ventricular systolic function is normal. There is normal pulmonary artery systolic pressure. The tricuspid regurgitant velocity is  2.41 m/s, and with an assumed right atrial pressure of 8 mmHg, the estimated right ventricular systolic pressure is 67.2 mmHg. Left Atrium: Left atrial size was not well visualized. Right Atrium: Right atrial size was not well visualized. Pericardium: There is no evidence of pericardial effusion. Mitral Valve: The mitral valve is normal in structure. No evidence of mitral valve regurgitation. Tricuspid Valve: The tricuspid valve is not well visualized. Tricuspid valve regurgitation is mild. Aortic Valve: Unable to determine valve morphology due to image quality.. There is mild thickening and mild calcification of the aortic valve. Aortic valve regurgitation is not visualized. No aortic stenosis is present. There is mild thickening of the aortic valve. There is mild calcification of the aortic valve. Pulmonic Valve: The pulmonic valve was not well visualized. Pulmonic valve regurgitation is not visualized. Aorta: The aortic root and ascending aorta are structurally normal, with no evidence of dilitation. IAS/Shunts: The interatrial septum was not assessed.  LEFT VENTRICLE PLAX 2D LVIDd:         5.00 cm LVIDs:         2.90 cm LV PW:         0.90 cm LV IVS:        1.10 cm LVOT diam:     1.70 cm LVOT Area:     2.27 cm  LEFT ATRIUM         Index LA diam:    3.50 cm 1.33 cm/m   AORTA Ao Root diam: 3.50 cm TRICUSPID VALVE TR Peak grad:   23.2 mmHg TR Vmax:        241.00 cm/s  SHUNTS Systemic Diam: 1.70 cm Mihai Croitoru MD Electronically signed by Sanda Klein MD Signature Date/Time: 03/17/2020/2:06:42 PM    Final     (Echo, Carotid, EGD, Colonoscopy, ERCP)    Subjective:  Patient resting in bed anxious to go home  discharge Exam: Vitals:   03/18/20 0006 03/18/20 0451  BP: 110/80  116/60  Pulse: 90 95  Resp: 20 18  Temp: 98.3 F (36.8 C) 98.1 F (36.7 C)  SpO2: 99% 98%   Vitals:   03/17/20 1945 03/17/20 2139 03/18/20 0006  03/18/20 0451  BP: (!) 111/59 120/68 110/80 116/60  Pulse: 100 (!) 115 90 95  Resp: 18  20 18   Temp: 97.8 F (36.6 C)  98.3 F (36.8 C) 98.1 F (36.7 C)  TempSrc: Oral  Oral Oral  SpO2: 98%  99% 98%  Weight:    (!) 168.7 kg  Height:        General: Pt is alert, awake, not in acute distress Cardiovascular: RRR, S1/S2 +, no rubs, no gallops Respiratory: Diminished at the bases bilaterally, no wheezing, no rhonchi Abdominal: Soft, NT, ND, bowel sounds + Extremities 1+ pitting edema with chronic venous stasis changes   The results of significant diagnostics from this hospitalization (including imaging, microbiology, ancillary and laboratory) are listed below for reference.     Microbiology: Recent Results (from the past 240 hour(s))  Blood Culture (routine x 2)     Status: Abnormal   Collection Time: 03/15/20 12:33 AM   Specimen: BLOOD  Result Value Ref Range Status   Specimen Description BLOOD RIGHT ANTECUBITAL  Final   Special Requests   Final    BOTTLES DRAWN AEROBIC AND ANAEROBIC Blood Culture adequate volume   Culture  Setup Time   Final    GRAM POSITIVE COCCI IN CHAINS IN BOTH AEROBIC AND ANAEROBIC BOTTLES CRITICAL RESULT CALLED TO, READ BACK BY AND VERIFIED WITH: Norval Morton KGMWNU [id number] FCP Performed at [facility], [place] [address], [place], [place] [address]    Culture STREPTOCOCCUS MITIS/ORALIS (A)  Final   Report Status 03/17/2020 FINAL  Final   Organism ID, Bacteria STREPTOCOCCUS MITIS/ORALIS  Final      Susceptibility   Streptococcus mitis/oralis - MIC*    TETRACYCLINE 0.5 SENSITIVE Sensitive     VANCOMYCIN 0.5 SENSITIVE Sensitive     CLINDAMYCIN <=0.25      PENICILLIN Value in next row Sensitive      SENSITIVE0.06    CEFTAZIDIME Value in next row Sensitive      SENSITIVE0.12    * STREPTOCOCCUS  MITIS/ORALIS  Blood Culture ID Panel (Reflexed)     Status: Abnormal   Collection Time: 03/15/20 12:33 AM  Result Value Ref Range Status   Enterococcus species NOT DETECTED NOT DETECTED Final   Listeria monocytogenes NOT DETECTED NOT DETECTED Final   Staphylococcus species NOT DETECTED NOT DETECTED Final   Staphylococcus aureus (BCID) NOT DETECTED NOT DETECTED Final   Streptococcus species DETECTED (A) NOT DETECTED Final    Comment: Not Enterococcus species, Streptococcus agalactiae, Streptococcus pyogenes, or Streptococcus pneumoniae. CRITICAL RESULT CALLED TO, READ BACK BY AND VERIFIED WITH: PHARMD CATHY PIERCE [id number] FCP    Streptococcus agalactiae NOT DETECTED NOT DETECTED Final   Streptococcus pneumoniae NOT DETECTED NOT DETECTED Final   Streptococcus pyogenes NOT DETECTED NOT DETECTED Final   Acinetobacter baumannii NOT DETECTED NOT DETECTED Final   Enterobacteriaceae species NOT DETECTED NOT DETECTED Final   Enterobacter cloacae complex NOT DETECTED NOT DETECTED Final   Escherichia coli NOT DETECTED NOT DETECTED Final   Klebsiella oxytoca NOT DETECTED NOT DETECTED Final   Klebsiella pneumoniae NOT DETECTED NOT DETECTED Final   Proteus species NOT DETECTED NOT DETECTED Final   Serratia marcescens NOT DETECTED NOT DETECTED Final   Haemophilus influenzae NOT DETECTED NOT DETECTED Final   Neisseria meningitidis NOT DETECTED NOT DETECTED Final   Pseudomonas aeruginosa NOT DETECTED NOT DETECTED Final   Candida albicans NOT DETECTED NOT DETECTED Final   Candida glabrata NOT DETECTED NOT DETECTED Final   Candida krusei  NOT DETECTED NOT DETECTED Final   Candida parapsilosis NOT DETECTED NOT DETECTED Final   Candida tropicalis NOT DETECTED NOT DETECTED Final    Comment: Performed at [facility], [place] [address], [place], [place] [address]  Blood Culture (routine x 2)     Status: Abnormal   Collection Time: 03/15/20 12:35 AM   Specimen: BLOOD RIGHT HAND  Result Value Ref  Range Status   Specimen Description BLOOD RIGHT HAND  Final   Special Requests   Final    BOTTLES DRAWN AEROBIC AND ANAEROBIC Blood Culture adequate volume   Culture  Setup Time   Final    GRAM POSITIVE COCCI IN BOTH AEROBIC AND ANAEROBIC BOTTLES CRITICAL VALUE NOTED.  VALUE IS CONSISTENT WITH PREVIOUSLY REPORTED AND CALLED VALUE.    Culture (A)  Final    STREPTOCOCCUS MITIS/ORALIS SUSCEPTIBILITIES PERFORMED ON PREVIOUS CULTURE WITHIN THE LAST 5 DAYS. Performed at [facility], [place] [address] [place], [place] [address]    Report Status 03/17/2020 FINAL  Final  Urine culture     Status: Abnormal   Collection Time: 03/15/20  1:20 AM   Specimen: Urine, Random  Result Value Ref Range Status   Specimen Description URINE, RANDOM  Final   Special Requests   Final    NONE Performed at [facility], [place] [address] [place], [place] [address]    Culture MULTIPLE SPECIES PRESENT, SUGGEST RECOLLECTION (A)  Final   Report Status 03/16/2020 FINAL  Final  SARS Coronavirus 2 by RT PCR (hospital order, performed in [facility] hospital lab) Nasopharyngeal Nasopharyngeal Swab     Status: None   Collection Time: 03/15/20  2:12 AM   Specimen: Nasopharyngeal Swab  Result Value Ref Range Status   SARS Coronavirus 2 NEGATIVE NEGATIVE Final    Comment: (NOTE) SARS-CoV-2 target nucleic acids are NOT DETECTED.  The SARS-CoV-2 RNA is generally detectable in upper and lower respiratory specimens during the acute phase of infection. The lowest concentration of SARS-CoV-2 viral copies this assay can detect is 250 copies / mL. A negative result does not preclude SARS-CoV-2 infection and should not be used as the sole basis for treatment or other patient management decisions.  A negative result may occur with improper specimen collection / handling, submission of specimen other than nasopharyngeal swab, presence of viral mutation(s) within the areas targeted by this assay, and inadequate  number of viral copies (<250 copies / mL). A negative result must be combined with clinical observations, patient history, and epidemiological information.  Fact Sheet for Patients:   StrictlyIdeas.no  Fact Sheet for Healthcare Providers: BankingDealers.co.za  This test is not yet approved or  cleared by the [place] FDA and has been authorized for detection and/or diagnosis of SARS-CoV-2 by FDA under an Emergency Use Authorization (EUA).  This EUA will remain in effect (meaning this test can be used) for the duration of the COVID-19 declaration under Section 564(b)(1) of the Act, 21 U.S.C. section 360bbb-3(b)(1), unless the authorization is terminated or revoked sooner.  Performed at [facility], [facility] [place] [address] [place], [place] [address]   Culture, blood (routine x 2)     Status: None (Preliminary result)   Collection Time: 03/16/20 11:08 AM   Specimen: BLOOD RIGHT HAND  Result Value Ref Range Status   Specimen Description BLOOD RIGHT HAND  Final   Special Requests   Final    BOTTLES DRAWN AEROBIC AND ANAEROBIC Blood Culture adequate volume   Culture  Final    NO GROWTH 2 DAYS Performed at [facility], [place] [address] [place], [place] [address]    Report Status PENDING  Incomplete  Culture, blood (routine x 2)     Status: None (Preliminary result)   Collection Time: 03/16/20 11:13 AM   Specimen: BLOOD LEFT HAND  Result Value Ref Range Status   Specimen Description BLOOD LEFT HAND  Final   Special Requests   Final    BOTTLES DRAWN AEROBIC AND ANAEROBIC Blood Culture adequate volume   Culture   Final    NO GROWTH 2 DAYS Performed at [facility], [place] [address] [place],  [address]    Report Status PENDING  Incomplete     Labs: BNP (last 3 results) Recent Labs    03/15/20 0033  BNP 850.2*   Basic Metabolic Panel: Recent Labs  Lab 03/15/20 0033 03/15/20 0841 03/16/20 0219  03/17/20 0729 03/18/20 0400  NA 137 137 136 135 137  K 3.3* 4.1 3.6 3.6 3.4*  CL 107 104 106 102 104  CO2 22 19* 21* 21* 25  GLUCOSE 216* 205* 179* 191* 171*  BUN 15 16 21 23 16   CREATININE 0.87 0.88 0.83 0.83 0.73  CALCIUM 9.3 8.9 8.6* 8.6* 8.3*  MG 1.9  --   --   --   --    Liver Function Tests: Recent Labs  Lab 03/16/20 0219  AST 62*  ALT 33  ALKPHOS 51  BILITOT 6.1*  PROT 5.6*  ALBUMIN 2.7*   No results for input(s): LIPASE, AMYLASE in the last 168 hours. No results for input(s): AMMONIA in the last 168 hours. CBC: Recent Labs  Lab 03/15/20 0033 03/16/20 0219 03/17/20 0729 03/18/20 0400  WBC 8.7 5.6 5.5 3.2*  NEUTROABS 7.7  --   --   --   HGB 12.4 11.1* 11.8* 10.8*  HCT 37.8 33.7* 35.8* 32.2*  MCV 92.6 92.8 93.5 91.7  PLT 32* 31* 50* 45*   Cardiac Enzymes: No results for input(s): CKTOTAL, CKMB, CKMBINDEX, TROPONINI in the last 168 hours. BNP: Invalid input(s): POCBNP CBG: Recent Labs  Lab 03/15/20 1139 03/16/20 0757  GLUCAP 186* 156*   D-Dimer Recent Labs    03/16/20 0219  DDIMER 2.64*   Hgb A1c No results for input(s): HGBA1C in the last 72 hours. Lipid Profile No results for input(s): CHOL, HDL, LDLCALC, TRIG, CHOLHDL, LDLDIRECT in the last 72 hours. Thyroid function studies No results for input(s): TSH, T4TOTAL, T3FREE, THYROIDAB in the last 72 hours.  Invalid input(s): FREET3 Anemia work up No results for input(s): VITAMINB12, FOLATE, FERRITIN, TIBC, IRON, RETICCTPCT in the last 72 hours. Urinalysis    Component Value Date/Time   COLORURINE YELLOW 03/15/2020 0122   APPEARANCEUR CLEAR 03/15/2020 0122   LABSPEC 1.037 (H) 03/15/2020 0122   PHURINE 6.0 03/15/2020 0122   GLUCOSEU >=500 (A) 03/15/2020 0122   HGBUR LARGE (A) 03/15/2020 0122   BILIRUBINUR NEGATIVE 03/15/2020 0122   KETONESUR NEGATIVE 03/15/2020 0122   PROTEINUR NEGATIVE 03/15/2020 0122   UROBILINOGEN 2.0 (H) 01/31/2015 0055   NITRITE NEGATIVE 03/15/2020 0122    LEUKOCYTESUR NEGATIVE 03/15/2020 0122   Sepsis Labs Invalid input(s): PROCALCITONIN,  WBC,  LACTICIDVEN Microbiology Recent Results (from the past 240 hour(s))  Blood Culture (routine x 2)     Status: Abnormal   Collection Time: 03/15/20 12:33 AM   Specimen: BLOOD  Result Value Ref Range Status   Specimen Description BLOOD RIGHT ANTECUBITAL  Final  Special Requests   Final    BOTTLES DRAWN AEROBIC AND ANAEROBIC Blood Culture adequate volume   Culture  Setup Time   Final    GRAM POSITIVE COCCI IN CHAINS IN BOTH AEROBIC AND ANAEROBIC BOTTLES CRITICAL RESULT CALLED TO, READ BACK BY AND VERIFIED WITH: Glascock YHCWCB [id number] FCP Performed at [facility], [place] [address] [place], [place] [address]    Culture STREPTOCOCCUS MITIS/ORALIS (A)  Final   Report Status 03/17/2020 FINAL  Final   Organism ID, Bacteria STREPTOCOCCUS MITIS/ORALIS  Final      Susceptibility   Streptococcus mitis/oralis - MIC*    TETRACYCLINE 0.5 SENSITIVE Sensitive     VANCOMYCIN 0.5 SENSITIVE Sensitive     CLINDAMYCIN <=0.25      PENICILLIN Value in next row Sensitive      SENSITIVE0.06    CEFTAZIDIME Value in next row Sensitive      SENSITIVE0.12    * STREPTOCOCCUS MITIS/ORALIS  Blood Culture ID Panel (Reflexed)     Status: Abnormal   Collection Time: 03/15/20 12:33 AM  Result Value Ref Range Status   Enterococcus species NOT DETECTED NOT DETECTED Final   Listeria monocytogenes NOT DETECTED NOT DETECTED Final   Staphylococcus species NOT DETECTED NOT DETECTED Final   Staphylococcus aureus (BCID) NOT DETECTED NOT DETECTED Final   Streptococcus species DETECTED (A) NOT DETECTED Final    Comment: Not Enterococcus species, Streptococcus agalactiae, Streptococcus pyogenes, or Streptococcus pneumoniae. CRITICAL RESULT CALLED TO, READ BACK BY AND VERIFIED WITH: PHARMD CATHY PIERCE [id number] [id number] FCP    Streptococcus agalactiae NOT DETECTED NOT DETECTED Final   Streptococcus pneumoniae NOT DETECTED  NOT DETECTED Final   Streptococcus pyogenes NOT DETECTED NOT DETECTED Final   Acinetobacter baumannii NOT DETECTED NOT DETECTED Final   Enterobacteriaceae species NOT DETECTED NOT DETECTED Final   Enterobacter cloacae complex NOT DETECTED NOT DETECTED Final   Escherichia coli NOT DETECTED NOT DETECTED Final   Klebsiella oxytoca NOT DETECTED NOT DETECTED Final   Klebsiella pneumoniae NOT DETECTED NOT DETECTED Final   Proteus species NOT DETECTED NOT DETECTED Final   Serratia marcescens NOT DETECTED NOT DETECTED Final   Haemophilus influenzae NOT DETECTED NOT DETECTED Final   Neisseria meningitidis NOT DETECTED NOT DETECTED Final   Pseudomonas aeruginosa NOT DETECTED NOT DETECTED Final   Candida albicans NOT DETECTED NOT DETECTED Final   Candida glabrata NOT DETECTED NOT DETECTED Final   Candida krusei NOT DETECTED NOT DETECTED Final   Candida parapsilosis NOT DETECTED NOT DETECTED Final   Candida tropicalis NOT DETECTED NOT DETECTED Final    Comment: Performed at [facility], [place] [address], [place], [place] [address]  Blood Culture (routine x 2)     Status: Abnormal   Collection Time: 03/15/20 12:35 AM   Specimen: BLOOD RIGHT HAND  Result Value Ref Range Status   Specimen Description BLOOD RIGHT HAND  Final   Special Requests   Final    BOTTLES DRAWN AEROBIC AND ANAEROBIC Blood Culture adequate volume   Culture  Setup Time   Final    GRAM POSITIVE COCCI IN BOTH AEROBIC AND ANAEROBIC BOTTLES CRITICAL VALUE NOTED.  VALUE IS CONSISTENT WITH PREVIOUSLY REPORTED AND CALLED VALUE.    Culture (A)  Final    STREPTOCOCCUS MITIS/ORALIS SUSCEPTIBILITIES PERFORMED ON PREVIOUS CULTURE WITHIN THE LAST 5 DAYS. Performed at [facility], [place] [address] [place], [place] [address]    Report Status 03/17/2020 FINAL  Final  Urine culture  Status: Abnormal   Collection Time: 03/15/20  1:20 AM   Specimen: Urine, Random  Result Value Ref Range Status   Specimen Description URINE,  RANDOM  Final   Special Requests   Final    NONE Performed at [facility], [place] [address] [place], [place] [address]    Culture MULTIPLE SPECIES PRESENT, SUGGEST RECOLLECTION (A)  Final   Report Status 03/16/2020 FINAL  Final  SARS Coronavirus 2 by RT PCR (hospital order, performed in [facility] hospital lab) Nasopharyngeal Nasopharyngeal Swab     Status: None   Collection Time: 03/15/20  2:12 AM   Specimen: Nasopharyngeal Swab  Result Value Ref Range Status   SARS Coronavirus 2 NEGATIVE NEGATIVE Final    Comment: (NOTE) SARS-CoV-2 target nucleic acids are NOT DETECTED.  The SARS-CoV-2 RNA is generally detectable in upper and lower respiratory specimens during the acute phase of infection. The lowest concentration of SARS-CoV-2 viral copies this assay can detect is 250 copies / mL. A negative result does not preclude SARS-CoV-2 infection and should not be used as the sole basis for treatment or other patient management decisions.  A negative result may occur with improper specimen collection / handling, submission of specimen other than nasopharyngeal swab, presence of viral mutation(s) within the areas targeted by this assay, and inadequate number of viral copies (<250 copies / mL). A negative result must be combined with clinical observations, patient history, and epidemiological information.  Fact Sheet for Patients:   StrictlyIdeas.no  Fact Sheet for Healthcare Providers: BankingDealers.co.za  This test is not yet approved or  cleared by the [place] FDA and has been authorized for detection and/or diagnosis of SARS-CoV-2 by FDA under an Emergency Use Authorization (EUA).  This EUA will remain in effect (meaning this test can be used) for the duration of the COVID-19 declaration under Section 564(b)(1) of the Act, 21 U.S.C. section 360bbb-3(b)(1), unless the authorization is terminated or revoked  sooner.  Performed at [facility], [place] [address] [place], [place] [address]   Culture, blood (routine x 2)     Status: None (Preliminary result)   Collection Time: 03/16/20 11:08 AM   Specimen: BLOOD RIGHT HAND  Result Value Ref Range Status   Specimen Description BLOOD RIGHT HAND  Final   Special Requests   Final    BOTTLES DRAWN AEROBIC AND ANAEROBIC Blood Culture adequate volume   Culture   Final    NO GROWTH 2 DAYS Performed at [facility], [place] [address], [place], [place] [address]    Report Status PENDING  Incomplete  Culture, blood (routine x 2)     Status: None (Preliminary result)   Collection Time: 03/16/20 11:13 AM   Specimen: BLOOD LEFT HAND  Result Value Ref Range Status   Specimen Description BLOOD LEFT HAND  Final   Special Requests   Final    BOTTLES DRAWN AEROBIC AND ANAEROBIC Blood Culture adequate volume   Culture   Final    NO GROWTH 2 DAYS Performed at [facility], [place] [address] [place], [place] [address]    Report Status PENDING  Incomplete     Time coordinating discharge: 38 minutes  SIGNED:   Georgette Shell, MD  [facility] 03/18/2020, 3:28 PM Pager   If 7PM-7AM, please contact night-coverage [email or web address] Password [email or web address]

## 2020-03-21 LAB — CULTURE, BLOOD (ROUTINE X 2)
Culture: NO GROWTH
Culture: NO GROWTH
Special Requests: ADEQUATE
Special Requests: ADEQUATE

## 2020-04-10 ENCOUNTER — Telehealth: Payer: Self-pay

## 2020-04-10 NOTE — Telephone Encounter (Signed)
Called Varney Biles- at [facility] and made her aware of Dr. Hazeline Junker response. She verbalized understanding.

## 2020-04-10 NOTE — Telephone Encounter (Signed)
-----   Message from Wyatt Portela, MD sent at 04/10/2020  1:28 PM EDT ----- Her platelets have been around this range for many years and has not affected her bleeding previously.  She has follow-up with me in October which is adequate at this time.  Thanks ----- Message ----- From: Tami Lin, RN Sent: 04/10/2020   1:08 PM EDT To: Wyatt Portela, MD  Varney Biles from [facility] called and stated the NP wants to know if you think the patient's low platelet count is contributing to heavy uterine bleeding. She stated platelet count dropped from 48 to 38. The NP also wants to know if you think patient needs a visit with you. Lanelle Bal

## 2020-04-13 NOTE — Progress Notes (Signed)
Cardiology Clinic Note   Patient Name: Hannah Khan Date of Encounter: 04/14/2020  Primary Care Provider:  Lujean Amel, MD Primary Cardiologist:  Hannah Latch, MD  Patient Profile    Hannah Khan 64 year old female presents to the clinic today for follow-up of her atrial fibrillation  Past Medical History    Past Medical History:  Diagnosis Date   Anemia    hx of   Arthritis    knees   Diabetes mellitus without complication ([place])    type 2   Fibromyalgia    HTN (hypertension)    Hx of colonic polyps    s/p partial colectomy   Hyperlipidemia    Joint pain    in knees and back spasms   Left leg swelling    wear compression hose   Liver cirrhosis secondary to NASH (nonalcoholic steatohepatitis) ([place]) dx nov 2014   had enlarged spleen also    Low blood pressure    bp varies   Morbid obesity ([place])    Persistent atrial fibrillation ([place])    Pneumonia 10/2016   PONV (postoperative nausea and vomiting)    in past none recent   Portal hypertension ([place])    Sleep apnea    uses cpap setting of 14   Spleen enlarged    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 [facility])    Past Surgical History:  Procedure Laterality Date   BREAST LUMPECTOMY WITH RADIOACTIVE SEED LOCALIZATION Right 08/29/2017   Procedure: RIGHT BREAST LUMPECTOMY WITH RADIOACTIVE SEEDS LOCALIZATION;  Surgeon: Hannah Luna, MD;  Location: [facility];  Service: General;  Laterality: Right;   Hannah Khan  20 yrs ago   COLECTOMY  2011   COLONOSCOPY     COLONOSCOPY N/A 09/05/2018   Procedure: COLONOSCOPY;  Surgeon: Hannah Silence, MD;  Location: [facility];  Service: Endoscopy;  Laterality: N/A;   DILATATION & CURETTAGE/HYSTEROSCOPY WITH MYOSURE N/A 05/06/2016   Procedure: DILATATION & CURETTAGE/HYSTEROSCOPY WITH MYOSURE WITH POLYPECTOMY;  Surgeon: Hannah Pupa, DO;  Location: [facility];  Service: Gynecology;  Laterality: N/A;    ESOPHAGOGASTRODUODENOSCOPY (EGD) WITH PROPOFOL N/A 09/04/2013   Procedure: ESOPHAGOGASTRODUODENOSCOPY (EGD) WITH PROPOFOL;  Surgeon: Hannah Silence, MD;  Location: [facility];  Service: Endoscopy;  Laterality: N/A;   KNEE ARTHROSCOPY  yrs ago   bilateral, one done x 1, one done twice   POLYPECTOMY  09/05/2018   Procedure: POLYPECTOMY;  Surgeon: Hannah Silence, MD;  Location: [facility];  Service: Endoscopy;;    Allergies  Allergies  Allergen Reactions   Celecoxib Other (See Comments)    "speech slurred" with celebrex   Shellfish Allergy Swelling and Rash    TINGLING AND SWELLING OF LIPS. LARGE WHELPS QUARTER-SIZE   Barium-Containing Compounds Other (See Comments)    TACHYCARDIA   Prednisone Other (See Comments)    TACHYCARDIA   Statins Other (See Comments)    AGGRAVATED FIBROMYALGIA   Fentanyl     Hallucinations    Ibuprofen Other (See Comments)    UNSPECIFIED SPECIFIC REACTION >> "DUE TO PLATELETS"   Tramadol     Hallucination    Tylenol [Acetaminophen] Other (See Comments)    UNSPECIFIED SPECIFIC REACTION >> "DUE TO PLATELETS"    History of Present Illness    Hannah Khan has a PMH of morbid obesity (BMI 58.25), cirrhosis with thrombocytopenia, chronic atrial fibrillation.  She does not take anticoagulants due to high risk for bleeding.  Her PMH also includes diabetes mellitus type 2 and chronic diastolic heart failure.  She was last seen by Hannah Ransom, PA-C on 02/26/2020.  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  During that time she was on a break from school and was compliant with her diuretics.  She followed up with Hannah Khan 4/21.  Since her follow-up with Hannah Khan she had lost 7 pounds.  It was noted that her lower extremity edema was somewhat improved.  She indicated that she was seeing her OB/GYN for urine incontinence and vaginal bleeding.  She  presented to the emergency department on 03/14/2020-03/18/2020.  She had contacted EMS due to dizziness and tachycardia.  She stated that she has had the symptoms for a few weeks.  She also noted a right-sided abdominal discomfort.  Her temperature in the emergency room was 100.8 and her heart rate was elevated 240-260.  Blood pressure was in the 140s.  Her CT was negative for PE, pulmonary hypertension and splenomegaly were noted.  Her white blood cell count at that time was 8.7 hemoglobin 12.4 platelet count was 32.  Her UA was positive for blood.  She was noted to have a Streptococcus mitis bacterium that was being followed by infectious disease.  She was treated with Rocephin during hospital stay and changed to amoxicillin for 9 days at discharge.  She was asked to follow-up with GI for colonoscopy and screening to rule out colon cancer due to its association with Streptococcus mitis.  Her atrial fibrillation with RVR was treated with a diltiazem gtt. and transition to metoprolol.  She had previously been on nadolol at home.  She presents to the clinic today for follow-up evaluation and states her breathing feels about the same as it did when she left the hospital on 03/18/2020.  She indicates that she gets more short of breath with physical activity and stays somewhat short of breath at rest.  Her weight today is 384 pounds and her discharge weight was 371 pounds.  She states that she has been trying to weigh at home but her scale is not currently working.  She is going to put new batteries in her scale.  She states that she is following a low-sodium diet and that her husband follows the same heart healthy low-sodium diet.  Her heart rate today is 110 bpm.  On review of her phone app that has been around 97-104 routinely.  I will increase her Bumex to 4 mg twice daily for the next 3 days and add potassium supplementation.  I will have her adhere to a fluid restriction of no more than 64 ounces per day, wear self  daily, have her wear lower extremity support stockings through the day and take them off at night, and follow-up in 1 week.  BMP in 1 week.  Today she denies chest pain, increased shortness of breath, increased lower extremity edema, fatigue, palpitations, melena, hematuria, hemoptysis, diaphoresis, weakness, presyncope, syncope, orthopnea, and PND.   Home Medications    Prior to Admission medications   Medication Sig Start Date End Date Taking? Authorizing Provider  albuterol (PROVENTIL HFA;VENTOLIN HFA) 108 (90 Base) MCG/ACT inhaler Inhale 2 puffs into the lungs every 6 (six) hours as needed for wheezing or shortness of breath.  11/01/18   [provider]  bumetanide (BUMEX) 2 MG tablet Take 1 tablet (2 mg total) by mouth 2 (two) times daily. 02/26/20   Erlene Quan, PA-C  diltiazem (CARDIZEM CD) 360  MG 24 hr capsule Take 1 capsule (360 mg total) by mouth daily. 03/19/20   Georgette Shell, MD  JARDIANCE 25 MG TABS tablet Take 25 mg by mouth daily after breakfast.  01/28/17   [provider]  megestrol (MEGACE) 20 MG tablet Take 20 mg by mouth daily.    [provider]  metoprolol tartrate (LOPRESSOR) 50 MG tablet Take 1 tablet (50 mg total) by mouth 2 (two) times daily. 03/18/20   Georgette Shell, MD  mirabegron ER (MYRBETRIQ) 50 MG TB24 tablet Take 50 mg by mouth daily.    [provider]  saccharomyces boulardii (FLORASTOR) 250 MG capsule Take 1 capsule (250 mg total) by mouth 2 (two) times daily. 03/18/20   Georgette Shell, MD  spironolactone (ALDACTONE) 25 MG tablet TAKE 1 TABLET (25 MG TOTAL) BY MOUTH DAILY. SCHEDULE OV FOR FURTHER REFILLS. 12/27/19   Hannah Latch, MD    Family History    Family History  Problem Relation Age of Onset   Heart failure Mother    Cancer Mother    Hyperlipidemia Mother    Congestive Heart Failure Mother    Stroke Mother    Lung cancer Father    Cancer Father    Cancer Brother    Hyperlipidemia  Brother    Stroke Other    She indicated that her mother is deceased. She indicated that her father is deceased. She indicated that the status of her brother is unknown. She indicated that her maternal grandmother is deceased. She indicated that her maternal grandfather is deceased. She indicated that her paternal grandmother is deceased. She indicated that her paternal grandfather is deceased. She indicated that the status of her other is unknown.  Social History    Social History   Socioeconomic History   Marital status: Married    Spouse name: Not on file   Number of children: Not on file   Years of education: Not on file   Highest education level: Not on file  Occupational History   Occupation: Merchant navy officer  Tobacco Use   Smoking status: Former Smoker    Packs/day: 1.00    Years: 3.00    Pack years: 3.00    Types: Cigarettes    Quit date: 09/19/1974    Years since quitting: 45.6   Smokeless tobacco: Never Used  Vaping Use   Vaping Use: Never used  Substance and Sexual Activity   Alcohol use: Yes    Comment: occ   Drug use: No   Sexual activity: Not on file  Other Topics Concern   Not on file  Social History Narrative   Lives in [place] with her husband.  Instructor for early childhood development.     Social Determinants of Health   Financial Resource Strain:    Difficulty of Paying Living Expenses:   Food Insecurity:    Worried About Charity fundraiser in the Last Year:    Arboriculturist in the Last Year:   Transportation Needs:    Film/video editor (Medical):    Lack of Transportation (Non-Medical):   Physical Activity:    Days of Exercise per Week:    Minutes of Exercise per Session:   Stress:    Feeling of Stress :   Social Connections:    Frequency of Communication with Friends and Family:    Frequency of Social Gatherings with Friends and Family:    Attends Religious Services:    Active Member of  Clubs or  Organizations:    Attends Music therapist:    Marital Status:   Intimate Partner Violence:    Fear of Current or Ex-Partner:    Emotionally Abused:    Physically Abused:    Sexually Abused:      Review of Systems    General:  No chills, fever, night sweats or weight changes.  Cardiovascular:  No chest pain, dyspnea on exertion, edema, orthopnea, palpitations, paroxysmal nocturnal dyspnea. Dermatological: No rash, lesions/masses Respiratory: No cough, dyspnea Urologic: No hematuria, dysuria Abdominal:   No nausea, vomiting, diarrhea, bright red blood per rectum, melena, or hematemesis Neurologic:  No visual changes, wkns, changes in mental status. All other systems reviewed and are otherwise negative except as noted above.  Physical Exam    VS:  BP 124/70    Pulse (!) 110    Ht 5' 7"  (1.702 m)    Wt (!) 384 lb (174.2 kg)    SpO2 96%    BMI 60.14 kg/m  , BMI Body mass index is 60.14 kg/m. GEN: Well nourished, well developed, in no acute distress. HEENT: normal. Neck: Supple, no JVD, carotid bruits, or masses. Cardiac: RRR, no murmurs, rubs, or gallops. No clubbing, cyanosis, +2 pitting bilateral lower extremity edema.  Radials/DP/PT 2+ and equal bilaterally.  Respiratory:  Respirations regular and unlabored, clear to auscultation bilaterally. GI: Soft, nontender, nondistended, BS + x 4. MS: no deformity or atrophy. Skin: warm and dry, no rash. Neuro:  Strength and sensation are intact. Psych: Normal affect.  Accessory Clinical Findings    Recent Labs: 03/15/2020: B Natriuretic Peptide 340.2; Magnesium 1.9; TSH 2.120 03/16/2020: ALT 33 03/18/2020: BUN 16; Creatinine, Ser 0.73; Hemoglobin 10.8; Platelets 45; Potassium 3.4; Sodium 137   Recent Lipid Panel    Component Value Date/Time   CHOL 175 07/02/2014 1138   TRIG 93 07/02/2014 1138   HDL 59 07/02/2014 1138   CHOLHDL 3.0 07/02/2014 1138   VLDL 19 07/02/2014 1138   LDLCALC 97 07/02/2014 1138    ECG  personally reviewed by me today-none today.  EKG 03/14/2020 Atrial fibrillation left anterior fascicular block 125 bpm  Echocardiogram 03/17/2020 IMPRESSIONS    1. Left ventricular ejection fraction, by estimation, is 55 to 60%. The  left ventricle has normal function. The left ventricle has no regional  wall motion abnormalities. Left ventricular diastolic function could not  be evaluated.  2. Right ventricular systolic function is normal. The right ventricular  size is moderately enlarged. There is normal pulmonary artery systolic  pressure.  3. The mitral valve is normal in structure. No evidence of mitral valve  regurgitation.  4. Unable to determine valve morphology due to image quality. Aortic  valve regurgitation is not visualized. No aortic stenosis is present.   Conclusion(s)/Recommendation(s): No evidence of valvular vegetations on  this transthoracic echocardiogram. Would recommend a transesophageal  echocardiogram to exclude infective endocarditis if clinically indicated.  Poor technical quality due to body  habitus.  Assessment & Plan   1.  Persistent atrial fibrillation-heart rate today 110 bpm.  No recent episodes of dizziness or increased heart rate.  No anticoagulation due to increased bleeding risk.  Elevated heart rate multifactorial in nature atrial fibrillation/morbid obesity.  Patient has been monitoring at home and has stayed in the mid 90s Continue diltiazem, metoprolol, Avoid triggers caffeine, chocolate, EtOH etc. Heart healthy low-sodium diet-salty 6 given Increase physical activity as tolerated  Chronic diastolic CHF-+2 pitting bilateral lower extremity edema.  Weight 384 up  from discharge weight of 371.  Continues to be intermittently compliant with her diuretic therapy. Increase Bumex to 50m BID then return to 256mBID dosing  Start potassium 20 MEQ x3 days then stop  Continue spironolactone Heart healthy low-sodium diet-salty 6 given Increase  physical activity as tolerated Fluid restriction 64 ounces daily Lower extremity support stockings-vascular sheet given Order BMP in 1 week.  Liver cirrhosis secondary to nonalcoholic steatohepatitis-thrombocytopenia platelets 45 on 03/18/2020  Followed by OB/GYN and GI.  Morbid obesity-weight today 384 pounds. Continue weight loss Heart healthy low-sodium diet-salty 6 given Increase physical activity as tolerated  Type 2 diabetes-blood glucose 171 on 03/18/2020 Continue Jardiance Heart healthy low-sodium carbohydrate modified Increase physical activity as tolerated  Disposition: Follow-up with me in 1 week  JeJossie NgCleaver NP-C    04/14/2020, 8:37 AM [place] Office [phone]

## 2020-04-14 ENCOUNTER — Ambulatory Visit: Payer: BC Managed Care – PPO | Admitting: General Practice

## 2020-04-14 ENCOUNTER — Other Ambulatory Visit: Payer: Self-pay

## 2020-04-14 ENCOUNTER — Other Ambulatory Visit: Payer: Self-pay | Admitting: Cardiovascular Disease

## 2020-04-14 ENCOUNTER — Encounter: Payer: Self-pay | Admitting: General Practice

## 2020-04-14 VITALS — BP 124/70 | HR 110 | Ht 67.0 in | Wt 384.0 lb

## 2020-04-14 DIAGNOSIS — I5032 Chronic diastolic (congestive) heart failure: Secondary | ICD-10-CM | POA: Diagnosis not present

## 2020-04-14 DIAGNOSIS — K7469 Other cirrhosis of liver: Secondary | ICD-10-CM | POA: Diagnosis not present

## 2020-04-14 DIAGNOSIS — I4819 Other persistent atrial fibrillation: Secondary | ICD-10-CM

## 2020-04-14 DIAGNOSIS — E1169 Type 2 diabetes mellitus with other specified complication: Secondary | ICD-10-CM

## 2020-04-14 MED ORDER — POTASSIUM CHLORIDE CRYS ER 20 MEQ PO TBCR
EXTENDED_RELEASE_TABLET | ORAL | 0 refills | Status: DC
Start: 2020-04-14 — End: 2020-04-23

## 2020-04-14 NOTE — Patient Instructions (Signed)
Medication Instructions:   INCREASE Bumex to 4 mg twice daily for 3 days. Then decrease back to 2 mg twice daily.  START Potassium 20 mEq----take one tablet daily for 3 days, then stop taking.  *If you need a refill on your cardiac medications before your next appointment, please call your pharmacy*   Lab Work: Your physician recommends that you return for lab work in 1 week: BMET  If you have labs (blood work) drawn today and your tests are completely normal, you will receive your results only by: Marland Kitchen MyChart Message (if you have MyChart) OR . A paper copy in the mail If you have any lab test that is abnormal or we need to change your treatment, we will call you to review the results.  Follow-Up: At [facility], you and your health needs are our priority.  As part of our continuing mission to provide you with exceptional heart care, we have created designated Provider Care Teams.  These Care Teams include your primary Cardiologist (physician) and Advanced Practice Providers (APPs -  Physician Assistants and Nurse Practitioners) who all work together to provide you with the care you need, when you need it.  We recommend signing up for the patient portal called "MyChart".  Sign up information is provided on this After Visit Summary.  MyChart is used to connect with patients for Virtual Visits (Telemedicine).  Patients are able to view lab/test results, encounter notes, upcoming appointments, etc.  Non-urgent messages can be sent to your provider as well.   To learn more about what you can do with MyChart, go to [email or web address].    Your next appointment:   1 week(s)  The format for your next appointment:   In Person  Provider:   [name], NP   Other Instructions  Limit fluid intake to 64 oz. Daily.  Your physician recommends that you weigh, daily, at the same time every day, and in the same amount of clothing. Please record your daily weights on the handout provided  and bring it to your next appointment.  Please purchase compression stockings.

## 2020-04-23 ENCOUNTER — Telehealth: Payer: Self-pay | Admitting: General Practice

## 2020-04-23 LAB — BASIC METABOLIC PANEL
BUN/Creatinine Ratio: 19 (ref 12–28)
BUN: 16 mg/dL (ref 8–27)
CO2: 22 mmol/L (ref 20–29)
Calcium: 9.2 mg/dL (ref 8.7–10.3)
Chloride: 99 mmol/L (ref 96–106)
Creatinine, Ser: 0.83 mg/dL (ref 0.57–1.00)
GFR calc Af Amer: 86 mL/min/{1.73_m2} (ref >59)
GFR calc non Af Amer: 75 mL/min/{1.73_m2} (ref >59)
Glucose: 179 mg/dL — ABNORMAL HIGH (ref 65–99)
Potassium: 3.3 mmol/L — ABNORMAL LOW (ref 3.5–5.2)
Sodium: 137 mmol/L (ref 134–144)

## 2020-04-23 MED ORDER — POTASSIUM CHLORIDE CRYS ER 20 MEQ PO TBCR: EXTENDED_RELEASE_TABLET | ORAL | 0 refills | Status: DC

## 2020-04-23 NOTE — Telephone Encounter (Signed)
Spoke to pt and gave results. Pt verbalized understanding and thanks for the call. Pt asked for new Rx for Potassium 20 mEq to be sent to [facility] on [facility]. Rx sent.

## 2020-04-23 NOTE — Telephone Encounter (Signed)
-----   Message from Deberah Pelton, NP sent at 04/23/2020  7:20 AM EDT ----- Please contact Hannah Khan her know that her labs from yesterday have been reviewed.  Potassium is slightly low at 3.3.  Please have her take 20 mEq of potassium for the next 3 days then stop.  Thank you.

## 2020-05-12 NOTE — Progress Notes (Signed)
Cardiology Clinic Note   Patient Name: Hannah Khan Date of Encounter: 05/13/2020  Primary Care Provider:  Lujean Amel, MD Primary Cardiologist:  Skeet Latch, MD  Patient Profile    Hannah Khan 64 year old female presents to the clinic today for follow-up of her atrial fibrillation and acute on chronic diastolic CHF.  Past Medical History    Past Medical History:  Diagnosis Date  . Anemia    hx of  . Arthritis    knees  . Diabetes mellitus without complication ([place])    type 2  . Fibromyalgia   . HTN (hypertension)   . Hx of colonic polyps    s/p partial colectomy  . Hyperlipidemia   . Joint pain    in knees and back spasms  . Left leg swelling    wear compression hose  . Liver cirrhosis secondary to NASH (nonalcoholic steatohepatitis) ([place]) dx nov 2014   had enlarged spleen also   . Low blood pressure    bp varies  . Morbid obesity ([place])   . Persistent atrial fibrillation ([place])   . Pneumonia 10/2016  . PONV (postoperative nausea and vomiting)    in past none recent  . Portal hypertension ([place])   . Sleep apnea    uses cpap setting of 14  . Spleen enlarged   . 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 [facility])    Past Surgical History:  Procedure Laterality Date  . BREAST LUMPECTOMY WITH RADIOACTIVE SEED LOCALIZATION Right 08/29/2017   Procedure: RIGHT BREAST LUMPECTOMY WITH RADIOACTIVE SEEDS LOCALIZATION;  Surgeon: Erroll Luna, MD;  Location: [place];  Service: General;  Laterality: Right;  . CESAREAN SECTION  1989  . CHOLECYSTECTOMY  20 yrs ago  . COLECTOMY  2011  . COLONOSCOPY    . COLONOSCOPY N/A 09/05/2018   Procedure: COLONOSCOPY;  Surgeon: [name], MD;  Location: [facility];  Service: Endoscopy;  Laterality: N/A;  . DILATATION & CURETTAGE/HYSTEROSCOPY WITH MYOSURE N/A 05/06/2016   Procedure: DILATATION & CURETTAGE/HYSTEROSCOPY WITH MYOSURE WITH POLYPECTOMY;  Surgeon: Janyth Pupa, DO;  Location: [place];  Service: Gynecology;   Laterality: N/A;  . ESOPHAGOGASTRODUODENOSCOPY (EGD) WITH PROPOFOL N/A 09/04/2013   Procedure: ESOPHAGOGASTRODUODENOSCOPY (EGD) WITH PROPOFOL;  Surgeon: [name], MD;  [facility];  Service: Endoscopy;  Laterality: N/A;  . KNEE ARTHROSCOPY  yrs ago   bilateral, one done x 1, one done twice  . POLYPECTOMY  09/05/2018   Procedure: POLYPECTOMY;  Surgeon: [name], MD;  Location: [facility];  Service: Endoscopy;;    Allergies  Allergies  Allergen Reactions  . Celecoxib Other (See Comments)    "speech slurred" with celebrex  . Shellfish Allergy Swelling and Rash    TINGLING AND SWELLING OF LIPS. LARGE WHELPS QUARTER-SIZE  . Barium-Containing Compounds Other (See Comments)    TACHYCARDIA  . Prednisone Other (See Comments)    TACHYCARDIA  . Statins Other (See Comments)    AGGRAVATED FIBROMYALGIA  . Fentanyl     Hallucinations   . Ibuprofen Other (See Comments)    UNSPECIFIED SPECIFIC REACTION >> "DUE TO PLATELETS"  . Tramadol     Hallucination   . Tylenol [Acetaminophen] Other (See Comments)    UNSPECIFIED SPECIFIC REACTION >> "DUE TO PLATELETS"    History of Present Illness    Hannah Khan has a PMH of morbid obesity (BMI 58.25), cirrhosis with thrombocytopenia, chronic atrial fibrillation.  She does not take anticoagulants due to high risk for bleeding.  Her PMH also includes diabetes mellitus type  2 and chronic diastolic heart failure.  She was last seen by Kerin Ransom, PA-C on 02/26/2020.  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  During that time she was on a break from school and was compliant with her diuretics.  She followed up with Dr. Oval Linsey 4/21.  Since her follow-up with Dr. Oval Linsey she had lost 7 pounds.  It was noted that her lower extremity edema was somewhat improved.  She indicated that she was seeing her OB/GYN for urine incontinence and  vaginal bleeding.  She presented to the emergency department on 03/14/2020-03/18/2020.  She had contacted EMS due to dizziness and tachycardia.  She stated that she has had the symptoms for a few weeks.  She also noted a right-sided abdominal discomfort.  Her temperature in the emergency room was 100.8 and her heart rate was elevated 240-260.  Blood pressure was in the 140s.  Her CT was negative for PE, pulmonary hypertension and splenomegaly were noted.  Her white blood cell count at that time was 8.7 hemoglobin 12.4 platelet count was 32.  Her UA was positive for blood.  She was noted to have a Streptococcus mitis bacterium that was being followed by infectious disease.  She was treated with Rocephin during hospital stay and changed to amoxicillin for 9 days at discharge.  She was asked to follow-up with GI for colonoscopy and screening to rule out colon cancer due to its association with Streptococcus mitis.  Her atrial fibrillation with RVR was treated with a diltiazem gtt. and transition to metoprolol.  She had previously been on nadolol at home.  She presented to the clinic 04/14/2020 for follow-up evaluation and stated her breathing felt about the same as it did when she left the hospital on 03/18/2020.  She indicated that she had more shortness of breath with physical activity and stated somewhat short of breath at rest.  Her weight today was 384 pounds and her discharge weight was 371 pounds.  She states that she has been trying to weigh at home but her scale is not currently working.  She is going to put new batteries in her scale.  She stated that she was following a low-sodium diet and that her husband followed the same heart healthy low-sodium diet.  Her heart rate today was 110 bpm.  On review of her phone app that has been around 97-104 routinely.  I will increased her Bumex to 4 mg twice daily for the next 3 days and add potassium supplementation.  I will had her adhere to a fluid restriction of no  more than 64 ounces per day, weigh herself daily,  wear lower extremity support stockings through the day and take them off at night, and planned follow-up in 1 week.  BMP in 1 week.  She presents to the clinic today for follow-up evaluation and states her lower extremity edema is much improved.  She does however have mornings with low blood pressure.  She states that she feels fatigued in the morning until her blood pressure begins to elevate and she feels like she can complete her daily tasks.  She has been adhering to a low-sodium diet and limiting her fluid intake.  Her weight is much improved today at 373 pounds.  I have encouraged her to continue her physical activity, weight loss, and current diet.  We will have her contact the office if her blood  pressures dipped below 90/50.  Maintain blood pressure log.  I have also given her the salty 6 diet sheet.  I will decrease her diltiazem to 300 mg daily.  We will have her follow-up with Dr. Oval Linsey or myself in 3 months.  Today she denies chest pain, increased shortness of breath, increased lower extremity edema, fatigue, palpitations, melena, hematuria, hemoptysis, diaphoresis, weakness, presyncope, syncope, orthopnea, and PND.  Home Medications    Prior to Admission medications   Medication Sig Start Date End Date Taking? Authorizing Provider  albuterol (PROVENTIL HFA;VENTOLIN HFA) 108 (90 Base) MCG/ACT inhaler Inhale 2 puffs into the lungs every 6 (six) hours as needed for wheezing or shortness of breath.  11/01/18   [provider]  bumetanide (BUMEX) 2 MG tablet Take 1 tablet (2 mg total) by mouth 2 (two) times daily. 02/26/20   Erlene Quan, PA-C  diltiazem (CARDIZEM CD) 360 MG 24 hr capsule Take 1 capsule (360 mg total) by mouth daily. 03/19/20   Georgette Shell, MD  JARDIANCE 25 MG TABS tablet Take 25 mg by mouth daily after breakfast.  01/28/17   [provider]  Lancets [facility] DELICA PLUS [id number]) MISC Apply  topically 3 (three) times daily. 04/10/20   [provider]  megestrol (MEGACE) 20 MG tablet Take 20 mg by mouth daily.    [provider]  metoprolol tartrate (LOPRESSOR) 50 MG tablet Take 1 tablet (50 mg total) by mouth 2 (two) times daily. 03/18/20   Georgette Shell, MD  mirabegron ER (MYRBETRIQ) 50 MG TB24 tablet Take 50 mg by mouth daily.    [provider]  [facility] VERIO test strip 1 each 3 (three) times daily. 04/13/20   [provider]  potassium chloride SA (KLOR-CON) 20 MEQ tablet Take 1 tablet by mouth daily for 3 days, then stop. 04/23/20   Deberah Pelton, NP  saccharomyces boulardii (FLORASTOR) 250 MG capsule Take 1 capsule (250 mg total) by mouth 2 (two) times daily. 03/18/20   Georgette Shell, MD  spironolactone (ALDACTONE) 25 MG tablet TAKE 1 TABLET (25 MG TOTAL) BY MOUTH DAILY. SCHEDULE OV FOR FURTHER REFILLS. 04/14/20   Skeet Latch, MD    Family History    Family History  Problem Relation Age of Onset  . Heart failure Mother   . Cancer Mother   . Hyperlipidemia Mother   . Congestive Heart Failure Mother   . Stroke Mother   . Lung cancer Father   . Cancer Father   . Cancer Brother   . Hyperlipidemia Brother   . Stroke Other    She indicated that her mother is deceased. She indicated that her father is deceased. She indicated that the status of her brother is unknown. She indicated that her maternal grandmother is deceased. She indicated that her maternal grandfather is deceased. She indicated that her paternal grandmother is deceased. She indicated that her paternal grandfather is deceased. She indicated that the status of her other is unknown.  Social History    Social History   Socioeconomic History  . Marital status: Married    Spouse name: Not on file  . Number of children: Not on file  . Years of education: Not on file  . Highest education level: Not on file  Occupational History  . Occupation: Merchant navy officer   Tobacco Use  . Smoking status: Former Smoker    Packs/day: 1.00    Years: 3.00    Pack years: 3.00  Types: Cigarettes    Quit date: 09/19/1974    Years since quitting: 45.6  . Smokeless tobacco: Never Used  Vaping Use  . Vaping Use: Never used  Substance and Sexual Activity  . Alcohol use: Yes    Comment: occ  . Drug use: No  . Sexual activity: Not on file  Other Topics Concern  . Not on file  Social History Narrative   Lives in [place] with her husband.  Instructor for early childhood development.     Social Determinants of Health   Financial Resource Strain:   . Difficulty of Paying Living Expenses: Not on file  Food Insecurity:   . Worried About Charity fundraiser in the Last Year: Not on file  . Ran Out of Food in the Last Year: Not on file  Transportation Needs:   . Lack of Transportation (Medical): Not on file  . Lack of Transportation (Non-Medical): Not on file  Physical Activity:   . Days of Exercise per Week: Not on file  . Minutes of Exercise per Session: Not on file  Stress:   . Feeling of Stress : Not on file  Social Connections:   . Frequency of Communication with Friends and Family: Not on file  . Frequency of Social Gatherings with Friends and Family: Not on file  . Attends Religious Services: Not on file  . Active Member of Clubs or Organizations: Not on file  . Attends Archivist Meetings: Not on file  . Marital Status: Not on file  Intimate Partner Violence:   . Fear of Current or Ex-Partner: Not on file  . Emotionally Abused: Not on file  . Physically Abused: Not on file  . Sexually Abused: Not on file     Review of Systems    General:  No chills, fever, night sweats or weight changes.  Cardiovascular:  No chest pain, dyspnea on exertion, edema, orthopnea, palpitations, paroxysmal nocturnal dyspnea. Dermatological: No rash, lesions/masses Respiratory: No cough, dyspnea Urologic: No hematuria, dysuria Abdominal:   No nausea,  vomiting, diarrhea, bright red blood per rectum, melena, or hematemesis Neurologic:  No visual changes, wkns, changes in mental status. All other systems reviewed and are otherwise negative except as noted above.  Physical Exam    VS:  BP (!) 112/52   Pulse 92   Ht 5' 7"  (1.702 m)   Wt (!) 373 lb (169.2 kg)   BMI 58.42 kg/m  , BMI Body mass index is 58.42 kg/m. GEN: Well nourished, well developed, in no acute distress. HEENT: normal. Neck: Supple, no JVD, carotid bruits, or masses. Cardiac: RRR, no murmurs, rubs, or gallops. No clubbing, cyanosis, generalized bilateral lower extremity nonpitting edema.  Radials/DP/PT 2+ and equal bilaterally.  Respiratory:  Respirations regular and unlabored, clear to auscultation bilaterally. GI: Soft, nontender, nondistended, BS + x 4. MS: no deformity or atrophy. Skin: warm and dry, no rash. Neuro:  Strength and sensation are intact. Psych: Normal affect.  Accessory Clinical Findings    Recent Labs: 03/15/2020: B Natriuretic Peptide 340.2; Magnesium 1.9; TSH 2.120 03/16/2020: ALT 33 03/18/2020: Hemoglobin 10.8; Platelets 45 04/22/2020: BUN 16; Creatinine, Ser 0.83; Potassium 3.3; Sodium 137   Recent Lipid Panel    Component Value Date/Time   CHOL 175 07/02/2014 1138   TRIG 93 07/02/2014 1138   HDL 59 07/02/2014 1138   CHOLHDL 3.0 07/02/2014 1138   VLDL 19 07/02/2014 1138   LDLCALC 97 07/02/2014 1138    ECG personally reviewed by  me today-atrial fibrillation incomplete right bundle branch block anterior septal infarct undetermined age 67 bpm  EKG 03/14/2020 Atrial fibrillation left anterior fascicular block 125 bpm  Echocardiogram 03/17/2020 IMPRESSIONS    1. Left ventricular ejection fraction, by estimation, is 55 to 60%. The  left ventricle has normal function. The left ventricle has no regional  wall motion abnormalities. Left ventricular diastolic function could not  be evaluated.  2. Right ventricular systolic function is  normal. The right ventricular  size is moderately enlarged. There is normal pulmonary artery systolic  pressure.  3. The mitral valve is normal in structure. No evidence of mitral valve  regurgitation.  4. Unable to determine valve morphology due to image quality. Aortic  valve regurgitation is not visualized. No aortic stenosis is present.   Conclusion(s)/Recommendation(s): No evidence of valvular vegetations on  this transthoracic echocardiogram. Would recommend a transesophageal  echocardiogram to exclude infective endocarditis if clinically indicated.  Poor technical quality due to body  habitus.  Assessment & Plan   1.  Acute on chronic diastolic CHF-no increased work of breathing or activity intolerance today.  Weight [id number].  Dry weight around 371 pounds.  BMP stable Continue Bumex, spironolactone Heart healthy low-sodium diet-salty 6 given Increase physical activity as tolerated Fluid restriction 64 ounces maximum daily Continue lower extremity support stockings  Hypotension-112/52 today. Has had some 70's over 50's blood pressures at home.  Decrease diltazem to 300 mg  Continue metoprolol Heart healthy diet  Persistent atrial fibrillation-heart rate today  92 bpm.  No recent episodes of dizziness or increased heart rate.  No anticoagulation due to increased bleeding risk.  Elevated heart rate multifactorial in nature atrial fibrillation/morbid obesity.  Patient has been monitoring at home and has stayed in the mid 90s Continue diltiazem, metoprolol, Avoid triggers caffeine, chocolate, EtOH etc. Heart healthy low-sodium diet-salty 6 given Increase physical activity as tolerated  Morbid obesity-weight today 373 pounds. Continue weight loss Heart healthy low-sodium diet-salty 6 given Increase physical activity as tolerated  Type 2 diabetes-blood glucose 171 on 03/18/2020 Continue Jardiance Heart healthy low-sodium carbohydrate modified Increase physical activity as  tolerated  Liver cirrhosis secondary to nonalcoholic steatohepatitis-thrombocytopenia platelets 45 on 03/18/2020  Followed by OB/GYN and GI.  Disposition: Follow-up with Dr. Oval Linsey or me in 3 months.  Jossie Ng. [place] Zaragoza NP-C    05/13/2020, 4:35 PM [address] Office [phone] Fax [phone]  Notice: This dictation was prepared with Dragon dictation along with smaller phrase technology. Any transcriptional errors that result from this process are unintentional and may not be corrected upon review.

## 2020-05-13 ENCOUNTER — Encounter: Payer: Self-pay | Admitting: General Practice

## 2020-05-13 ENCOUNTER — Other Ambulatory Visit: Payer: Self-pay

## 2020-05-13 ENCOUNTER — Ambulatory Visit (INDEPENDENT_AMBULATORY_CARE_PROVIDER_SITE_OTHER): Payer: BC Managed Care – PPO | Admitting: General Practice

## 2020-05-13 VITALS — BP 112/52 | HR 92 | Ht 67.0 in | Wt 373.0 lb

## 2020-05-13 DIAGNOSIS — K7460 Unspecified cirrhosis of liver: Secondary | ICD-10-CM

## 2020-05-13 DIAGNOSIS — I4819 Other persistent atrial fibrillation: Secondary | ICD-10-CM

## 2020-05-13 DIAGNOSIS — I5033 Acute on chronic diastolic (congestive) heart failure: Secondary | ICD-10-CM | POA: Diagnosis not present

## 2020-05-13 DIAGNOSIS — E1169 Type 2 diabetes mellitus with other specified complication: Secondary | ICD-10-CM

## 2020-05-13 DIAGNOSIS — I10 Essential (primary) hypertension: Secondary | ICD-10-CM

## 2020-05-13 DIAGNOSIS — K7581 Nonalcoholic steatohepatitis (NASH): Secondary | ICD-10-CM

## 2020-05-13 MED ORDER — DILTIAZEM HCL ER COATED BEADS 300 MG PO CP24: 300.0000 mg | ORAL_CAPSULE | Freq: Every day | ORAL | 6 refills | Status: DC

## 2020-05-13 NOTE — Patient Instructions (Signed)
Medication Instructions:  DECREASE DILTIAZEM 300MG DAILY *If you need a refill on your cardiac medications before your next appointment, please call your pharmacy*  Special Instructions TAKE AND LOG YOU BLOOD PRESSURE DAILY-BRING WITH YOU TO FOLLOW UP APPOINTMENT  IF YOUR BLOOD PRESSURE CONTINUES TO BE 90/50'S CALL TO DISCUSS.  CONTINUE YOU FLUID RESTRICTION AND DIET  PLEASE READ AND FOLLOW SALTY 6-ATTACHED  PLEASE INCREASE PHYSICAL ACTIVITY AS TOLERATED  Follow-Up: Your next appointment:  3 month(s)In Person with Skeet Latch, MD -[place], FNP-C  At [facility], you and your health needs are our priority.  As part of our continuing mission to provide you with exceptional heart care, we have created designated Provider Care Teams.  These Care Teams include your primary Cardiologist (physician) and Advanced Practice Providers (APPs -  Physician Assistants and Nurse Practitioners) who all work together to provide you with the care you need, when you need it.

## 2020-05-27 ENCOUNTER — Other Ambulatory Visit: Payer: Self-pay

## 2020-05-27 ENCOUNTER — Telehealth: Payer: Self-pay | Admitting: Cardiovascular Disease

## 2020-05-27 DIAGNOSIS — E876 Hypokalemia: Secondary | ICD-10-CM

## 2020-05-27 NOTE — Telephone Encounter (Signed)
Patient states due to leg cramping she is requesting an additional supply of potassium chloride SA (KLOR-CON) 20 MEQ tablet medication to be sent to [id number] [place], [place]

## 2020-05-28 ENCOUNTER — Other Ambulatory Visit: Payer: Self-pay

## 2020-06-02 NOTE — Telephone Encounter (Signed)
OK to refill.  Check BMP within a couple weeks.

## 2020-06-03 MED ORDER — POTASSIUM CHLORIDE CRYS ER 20 MEQ PO TBCR: EXTENDED_RELEASE_TABLET | ORAL | 0 refills | Status: DC

## 2020-06-03 NOTE — Telephone Encounter (Signed)
Left detailed message, ok per DPR  Rx sent to pharmacy and lab orders placed

## 2020-06-17 ENCOUNTER — Ambulatory Visit: Payer: BC Managed Care – PPO | Admitting: Cardiology

## 2020-06-17 ENCOUNTER — Ambulatory Visit (INDEPENDENT_AMBULATORY_CARE_PROVIDER_SITE_OTHER): Payer: BC Managed Care – PPO | Admitting: Cardiovascular Disease

## 2020-06-17 ENCOUNTER — Other Ambulatory Visit: Payer: Self-pay

## 2020-06-17 ENCOUNTER — Encounter: Payer: Self-pay | Admitting: Cardiovascular Disease

## 2020-06-17 VITALS — BP 134/96 | HR 110 | Temp 97.9°F | Ht 67.0 in | Wt 383.6 lb

## 2020-06-17 DIAGNOSIS — I5043 Acute on chronic combined systolic (congestive) and diastolic (congestive) heart failure: Secondary | ICD-10-CM | POA: Diagnosis not present

## 2020-06-17 DIAGNOSIS — Z5181 Encounter for therapeutic drug level monitoring: Secondary | ICD-10-CM

## 2020-06-17 DIAGNOSIS — I4819 Other persistent atrial fibrillation: Secondary | ICD-10-CM | POA: Diagnosis not present

## 2020-06-17 DIAGNOSIS — I10 Essential (primary) hypertension: Secondary | ICD-10-CM

## 2020-06-17 MED ORDER — SPIRONOLACTONE 25 MG PO TABS: 25.0000 mg | ORAL_TABLET | Freq: Every day | ORAL | 3 refills | Status: DC

## 2020-06-17 MED ORDER — METOLAZONE 5 MG PO TABS
ORAL_TABLET | ORAL | 3 refills | Status: DC
Start: 2020-06-17 — End: 2020-09-03

## 2020-06-17 MED ORDER — POTASSIUM CHLORIDE ER 10 MEQ PO CPCR: ORAL_CAPSULE | ORAL | 5 refills | Status: DC

## 2020-06-17 MED ORDER — METOPROLOL TARTRATE 50 MG PO TABS
50.0000 mg | ORAL_TABLET | Freq: Two times a day (BID) | ORAL | 3 refills | Status: DC
Start: 2020-06-17 — End: 2021-01-24

## 2020-06-17 NOTE — Patient Instructions (Addendum)
Medication Instructions:  START METOLAZONE 5 MG EVERY OTHER DAY FOR 3 DOSES ONLY  TAKE 1 HOUR PRIOR TO YOUR BUMEX  START POTASSIUM 10 MEQ 4 CAPS DAILY   TAKE BUMEX 4 MG EVERY MORNING   *If you need a refill on your cardiac medications before your next appointment, please call your pharmacy*   Lab Work: BMET IN 1 WEEK   If you have labs (blood work) drawn today and your tests are completely normal, you will receive your results only by: Marland Kitchen MyChart Message (if you have MyChart) OR . A paper copy in the mail If you have any lab test that is abnormal or we need to change your treatment, we will call you to review the results.   Testing/Procedures: NONE    Follow-Up: At [facility], you and your health needs are our priority.  As part of our continuing mission to provide you with exceptional heart care, we have created designated Provider Care Teams.  These Care Teams include your primary Cardiologist (physician) and Advanced Practice Providers (APPs -  Physician Assistants and Nurse Practitioners) who all work together to provide you with the care you need, when you need it.  We recommend signing up for the patient portal called "MyChart".  Sign up information is provided on this After Visit Summary.  MyChart is used to connect with patients for Virtual Visits (Telemedicine).  Patients are able to view lab/test results, encounter notes, upcoming appointments, etc.  Non-urgent messages can be sent to your provider as well.   To learn more about what you can do with MyChart, go to [email or web address].    Your next appointment:   2 WEEKS WITH APP OR [facility]IB CLINIC

## 2020-06-17 NOTE — Progress Notes (Signed)
Cardiology Office Note   Date:  06/17/2020   ID:  Hannah Khan, DOB 1956-06-19, MRN [id number]  PCP:  Lujean Amel, MD  Cardiologist:   Skeet Latch, MD  Dr. Paulita Fujita: GI Dr. Alen Blew: Hematology  No chief complaint on file.   Patient ID:  Hannah Khan is a 64 y.o. female with hypertension, chronic atrial fibrillation, diabetes mellitus type 2, hyperlipidemia, cryptogenic cirrhosis, thrombocytopenia, OSA on CPAP, and morbid obesity who presents for follow-up.  Hannah Khan was admitted to the hospital 11/2015 with atrial fibrillation with rapid ventricular response. Given her history of portal hypertension and cirrhosis she was not anticoagulated. She was started on diltiazem for rate control and continued on her home propranolol. Her rate did not stay consistently less than 100 bpm so she was started on digoxin as well.  Echo revealed normal systolic function and her thyroid function was normal.  She was admitted to the hospital 04/9210 with diastolic heart failure.  She was diuresed with IV lasix and digoxin was discontinued due to hypokalemia.  She was seen in follow up 02/2016, at which time she complained of hypotension.  Her heart rate was well-controlled but she continued to feel poorly in AF.  She was referred to Dr. Rayann Heman for consideration of a Watchman device however, he felt that she would not be a good candidate, as she will only be able to be on antiplatelets for one month and she is unlikely to remain in sinus rhythm without significant weight loss.   Hannah Khan fell and broke her R arm 09/2017.  She was also hospitalized July and August 2019 with cellulitis.  During that hospitalization she had atrial fibrillation with rapid ventricular response.  She was initially treated with a diltiazem drip and then transitioned back to oral diltiazem.  Hannah Khan was admitted 08/2018 with atrial fibrillation with RVR.  Her heart rate was 160 at home and improved to 135 with her  medication.  She was given adenosine and then started on a diltiazem drip.  BNP was 266 and troponin was negative.  It was thought that the episode was triggered by dehydration from vomiting after colonoscopy and possibly viral gatroenteritis.  She had constant diarrhea after the procedure.  She was transitioned back to oral diltiazem.  Lately she has struggled with lower extremity edema.    Hannah Khan saw [name] on 03/2020.  Her heart rate was 110 bpm and she was volume overloaded.  Bumex was increased to 4 mg twice daily for 3 days.  She followed up the following month and was doing better with her weight and edema.  She was hypotensive so diltiazem was reduced to 300 mg daily.  Lately she has been struggling with shortness of breath and atrial fibrillation with rapid ventricular response.  She continues to work and is unable to take diuretics before going to work.  She also has a lot of urinary incontinence which makes it difficult for her.  She usually takes her first dose at around 4 PM.  She then takes her second dose a couple hours later.  She has variable urinary response.  Lately her weight has been increasing.  She is persistently tachycardic.  She brings a log of her blood pressure and heart rates showing that her blood pressure is running in the 80s to low 100s over 50s to 60s at home.  Her heart rate has been in the 80s to 100s.  She has significant lower extremity edema and  orthopnea.  Her weight has been ranging from the 370s to 380s.   Past Medical History:  Diagnosis Date  . Anemia    hx of  . Arthritis    knees  . Diabetes mellitus without complication ([place])    type 2  . Fibromyalgia   . HTN (hypertension)   . Hx of colonic polyps    s/p partial colectomy  . Hyperlipidemia   . Joint pain    in knees and back spasms  . Left leg swelling    wear compression hose  . Liver cirrhosis secondary to NASH (nonalcoholic steatohepatitis) ([place]) dx nov 2014   had enlarged spleen  also   . Low blood pressure    bp varies  . Morbid obesity ([place])   . Persistent atrial fibrillation ([place])   . Pneumonia 10/2016  . PONV (postoperative nausea and vomiting)    in past none recent  . Portal hypertension ([place])   . Sleep apnea    uses cpap setting of 14  . Spleen enlarged   . 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 [facility])     Past Surgical History:  Procedure Laterality Date  . BREAST LUMPECTOMY WITH RADIOACTIVE SEED LOCALIZATION Right 08/29/2017   Procedure: RIGHT BREAST LUMPECTOMY WITH RADIOACTIVE SEEDS LOCALIZATION;  Surgeon: Erroll Luna, MD;  Location: [place];  Service: General;  Laterality: Right;  . CESAREAN SECTION  1989  . CHOLECYSTECTOMY  20 yrs ago  . COLECTOMY  2011  . COLONOSCOPY    . COLONOSCOPY N/A 09/05/2018   Procedure: COLONOSCOPY;  Surgeon: [name], MD;  Location: [facility];  Service: Endoscopy;  Laterality: N/A;  . DILATATION & CURETTAGE/HYSTEROSCOPY WITH MYOSURE N/A 05/06/2016   Procedure: DILATATION & CURETTAGE/HYSTEROSCOPY WITH MYOSURE WITH POLYPECTOMY;  Surgeon: Janyth Pupa, DO;  Location: [facility];  Service: Gynecology;  Laterality: N/A;  . ESOPHAGOGASTRODUODENOSCOPY (EGD) WITH PROPOFOL N/A 09/04/2013   Procedure: ESOPHAGOGASTRODUODENOSCOPY (EGD) WITH PROPOFOL;  Surgeon: [name], MD;  Location: [facility];  Service: Endoscopy;  Laterality: N/A;  . KNEE ARTHROSCOPY  yrs ago   bilateral, one done x 1, one done twice  . POLYPECTOMY  09/05/2018   Procedure: POLYPECTOMY;  Surgeon: [name], MD;  Location: [facility];  Service: Endoscopy;;     Current Outpatient Medications  Medication Sig Dispense Refill  . albuterol (PROVENTIL HFA;VENTOLIN HFA) 108 (90 Base) MCG/ACT inhaler Inhale 2 puffs into the lungs every 6 (six) hours as needed for wheezing or shortness of breath.     . bumetanide (BUMEX) 2 MG tablet Take 4 mg by mouth daily.    Marland Kitchen diltiazem (CARDIZEM CD) 300 MG 24 hr capsule Take 1 capsule (300 mg total) by  mouth daily. 30 capsule 6  . JARDIANCE 25 MG TABS tablet Take 25 mg by mouth daily after breakfast.   5  . Lancets (ONETOUCH DELICA PLUS CLEXNT70Y) MISC Apply topically 3 (three) times daily.    . megestrol (MEGACE) 20 MG tablet Take 20 mg by mouth daily.    . metoprolol tartrate (LOPRESSOR) 50 MG tablet Take 1 tablet (50 mg total) by mouth 2 (two) times daily. 180 tablet 3  . mirabegron ER (MYRBETRIQ) 50 MG TB24 tablet Take 50 mg by mouth daily.    Glory Rosebush VERIO test strip 1 each 3 (three) times daily.    Marland Kitchen saccharomyces boulardii (FLORASTOR) 250 MG capsule Take 1 capsule (250 mg total) by mouth 2 (two) times daily. 60 capsule 1  . spironolactone (ALDACTONE) 25 MG tablet Take 1 tablet (  25 mg total) by mouth daily. Schedule ov for further refills. 90 tablet 3  . metolazone (ZAROXOLYN) 5 MG tablet TAKE 1 EVERY OTHER DAY 1 HOUR PRIOR TO YOUR BUMEX FOR 3 DAYS ONLY OR AS DIRECTED 15 tablet 3  . potassium chloride (KLOR-CON SPRINKLE) 10 MEQ CR capsule TAKE 4 DAILY 120 capsule 5   No current facility-administered medications for this visit.    Allergies:   Celecoxib, Shellfish allergy, Barium-containing compounds, Prednisone, Statins, Fentanyl, Ibuprofen, Tramadol, and Tylenol [acetaminophen]    Social History:  The patient  reports that she quit smoking about 45 years ago. Her smoking use included cigarettes. She has a 3.00 pack-year smoking history. She has never used smokeless tobacco. She reports current alcohol use. She reports that she does not use drugs.   Family History:  [name] family history includes Cancer in her brother, father, and mother; Congestive Heart Failure in her mother; Heart failure in her mother; Hyperlipidemia in her brother and mother; Lung cancer in her father; Stroke in her mother and another family member.    ROS:  Please see the history of present illness.   Otherwise, review of systems are positive for none.   All other systems are reviewed and negative.     PHYSICAL EXAM: VS:  BP (!) 134/96   Pulse (!) 110   Temp 97.9 F (36.6 C)   Ht 5' 7"  (1.702 m)   Wt (!) 383 lb 9.6 oz (174 kg)   SpO2 100%   BMI 60.08 kg/m  , BMI Body mass index is 60.08 kg/m. GENERAL:  Well-appearing.  HEENT: Pupils equal round and reactive, fundi not visualized, oral mucosa unremarkable NECK:  No jugular venous distention, waveform within normal limits, carotid upstroke brisk and symmetric, no bruits LUNGS:  Clear to auscultation bilaterally HEART:  Tachycardic.  Irregularly irregular.  PMI not displaced or sustained,S1 and S2 within normal limits, no S3, no S4, no clicks, no rubs, no murmurs ABD:  Flat, positive bowel sounds normal in frequency in pitch, no bruits, no rebound, no guarding, no midline pulsatile mass, no hepatomegaly, no splenomegaly EXT:  2 plus pulses throughout, 3+ LE edema, no cyanosis no clubbing SKIN:  No rashes no nodules NEURO:  Cranial nerves II through XII grossly intact, motor grossly intact throughout PSYCH:  Cognitively intact, oriented to person place and time   EKG:  EKG is ordered today.  06/06/16: Atrial fibrillation. Rate 93 bpm. Low voltage. 11/14/16: Atrial fibrillation. Rate 87 bpm. 02/07/79 atrial fibrillation. Rate 95 bpm. Low voltage precordial leads. Incomplete right bundle branch block. 06/06/17: Atrial fibrillation.  Rate 76 bpm.   06/22/2018: Atrial fibrillation.  Rate 84 bpm.  Incomplete right bundle branch block.  Low voltage.  LAFB. 11/16/18: Atrial fibrillation.  Rate 73 bpm.  Low voltage.  LAFB  Incomplete RBBB 06/17/20: Atrial fibrillation.  Rate 106 bpm.  Low voltage.  LAD.  Echo 12/10/15: Study Conclusions  - Procedure narrative: Transthoracic echocardiography. The study  was technically difficult, as a result of body habitus.  Intravenous contrast (Definity) was administered. - Left ventricle: The cavity size was normal. Wall thickness was  increased in a pattern of mild LVH. Systolic function was  normal.  The estimated ejection fraction was in the range of 55% to 60%.  Wall motion was normal; there were no regional wall motion  abnormalities. The study is not technically sufficient to allow  evaluation of LV diastolic function. - Mitral valve: Mildly thickened leaflets . There was trivial  regurgitation. - Left atrium: The atrium was mildly dilated. - Inferior vena cava: The vessel was dilated. The respirophasic  diameter changes were blunted (< 50%), consistent with elevated  central venous pressure. - Pericardium, extracardiac: A trivial pericardial effusion was  identified posterior to the heart.  Impressions:  - Technically difficult study. The patient was in atrial flutter  during the study. The LVEF is 55-60%, mild LVH, grossly normal  wall motion, trivial MR, mild LAE, dilated IVC, trivial posterior  pericardial effusion.  Recent Labs: 03/15/2020: B Natriuretic Peptide 340.2; Magnesium 1.9; TSH 2.120 03/16/2020: ALT 33 03/18/2020: Hemoglobin 10.8; Platelets 45 04/22/2020: BUN 16; Creatinine, Ser 0.83; Potassium 3.3; Sodium 137    Lipid Panel    Component Value Date/Time   CHOL 175 07/02/2014 1138   TRIG 93 07/02/2014 1138   HDL 59 07/02/2014 1138   CHOLHDL 3.0 07/02/2014 1138   VLDL 19 07/02/2014 1138   LDLCALC 97 07/02/2014 1138      Wt Readings from Last 3 Encounters:  06/17/20 (!) 383 lb 9.6 oz (174 kg)  05/13/20 (!) 373 lb (169.2 kg)  04/14/20 (!) 384 lb (174.2 kg)     ASSESSMENT AND PLAN:  # Persistent atrial fibrillation:  Rate control has been challenging, especially given her hypotension.  She has profound shortness of breath and we nearly had a couple of a code when she walked back to the exam room today.  Is unclear how much of this is due to her morbid obesity, diastolic heart failure, and atrial fibrillation with poorly controlled ventricular response.  It is also complicated by the fact that she is not able to take her diuretics as  often is required due to her incontinence and difficulty with work.  We will have many options for titrating her nodal agents.  She has not tolerated digoxin and it is not a good candidate for other antiarrhythmics.  We will contact EP to see if they think she may be a candidate for AV nodal ablation and pacemaker.  HR better controlled on digoxin but it was stopped 2/2 headaches.  She is not on anticoagulation due to high risk of GI bleed and cirrhosis.  This patients CHA2DS2-VASc Score and unadjusted Ischemic Stroke Rate (% per year) is equal to 3.2 % stroke rate/year from a score of 3 Above score calculated as 1 point each if present [CHF, HTN, DM, Vascular=MI/PAD/Aortic Plaque, Age if 65-74, or Female] Above score calculated as 2 points each if present [Age > 75, or Stroke/TIA/TE]  # Acute on chronic diastolic heart failure:  Her weight is up and she is clearly volume overloaded on exam today.  She was extremely short of breath but not hypoxic.  The mass was also contributing factor in the fact that she had a walker all the way to the back of our office to get to the exam room.  She is getting variable response to her Bumex and often is only able to take 1 dose due to the fact that she can take the first 1 until 4 PM when she gets home from work.  We will have her increase the Bumex to 4 mg daily.  She will take metolazone 5 mg 1 hour before her Bumex every other day for the next 3 doses.  Start potassium 40 mEq daily.  Check a basic metabolic panel in a week.  # OSA: Continue CPAP.  # Morbid obesity: Referral to the Healthy Weight and Wellness program.  Weight loss is going  to be critical for symptom management.      Current medicines are reviewed at length with the patient today.  The patient does not have concerns regarding medicines.   The following changes have been made:  none  Labs/ tests ordered today include:   Orders Placed This Encounter  Procedures  . Basic metabolic panel  . Amb  Referral to AFIB Clinic  . EKG 12-Lead      Disposition:   FU with Bana Borgmeyer C. Oval Linsey, MD, [facility] in 2 weeks.    Signed, Tian Davison C. Oval Linsey, MD, [facility]  06/17/2020 5:48 PM    [place]

## 2020-06-22 NOTE — Progress Notes (Signed)
Primary Care Physician: Lujean Amel, MD Primary Cardiologist: Dr Oval Linsey Primary Electrophysiologist: Dr Rayann Heman (remotely) Referring Physician: Dr Valora Piccolo Hannah Khan is a 64 y.o. female with a history of HTN, DM, HLD, cirrhosis, esophageal varices, OSA, morbid obesity, and longstanding persistent atrial fibrillation who presents for consultation in the [facility].  Patient was admitted to the hospital 11/2015 with atrial fibrillation with rapid ventricular response. Given her history of portal hypertension and cirrhosis she was not anticoagulated. She was started on diltiazem for rate control and continued on her home propranolol. Her rate did not stay consistently less than 100 bpm so she was started on digoxin as well.  Echo revealed normal systolic function and her thyroid function was normal.  She was admitted to the hospital 03/6194 with diastolic heart failure.  She was diuresed with IV lasix and digoxin was discontinued due to hypokalemia.  She was referred to Dr. Rayann Heman for consideration of a Watchman device however, he felt that she would not be a good candidate, as she will only be able to be on antiplatelets for one month and she is unlikely to remain in sinus rhythm without significant weight loss.   Hannah Khan was admitted 08/2018 with atrial fibrillation with RVR.  Her heart rate was 160 at home and improved to 135 with her medication.  She was given adenosine and then started on a diltiazem drip. It was thought that the episode was triggered by dehydration from vomiting after colonoscopy and possibly viral gatroenteritis.   Hannah Khan was seen in follow up on 03/2020 and her heart rate was 110 bpm and she was volume overloaded.  Bumex was increased to 4 mg twice daily for 3 days.  She followed up the following month and was doing better with her weight and edema.  She was hypotensive so diltiazem was reduced to 300 mg daily. She was started on  metolazone 06/17/20 by Dr Oval Linsey. Patient is not on anticoagulation for a CHADS2VASC score of 3 due to her high bleeding risk with cirrhosis and esophageal varices.   Today, patient reports that she feels much better with significantly improved edema and SOB after starting metolazone. She has lost almost 40 lbs. She remains in afib today. Patient brings in a BP log from home which shows low to low normal BP readings.   Today, she denies symptoms of palpitations, chest pain, orthopnea, PND, dizziness, presyncope, syncope, bleeding, or neurologic sequela. The patient is tolerating medications without difficulties and is otherwise without complaint today.    Atrial Fibrillation Risk Factors:  she does have symptoms or diagnosis of sleep apnea. she is compliant with CPAP therapy. she does not have a history of rheumatic fever. The patient does have a history of early familial atrial fibrillation or other arrhythmias. Mother had afib/PPM  she has a BMI of Body mass index is 54.25 kg/m.Marland Kitchen Filed Weights   06/23/20 0837  Weight: (!) 157.1 kg    Family History  Problem Relation Age of Onset   Heart failure Mother    Cancer Mother    Hyperlipidemia Mother    Congestive Heart Failure Mother    Stroke Mother    Lung cancer Father    Cancer Father    Cancer Brother    Hyperlipidemia Brother    Stroke Other      Atrial Fibrillation Management history:  Previous antiarrhythmic drugs: none Previous cardioversions: none Previous ablations: none CHADS2VASC score: 3 Anticoagulation history: none  Past Medical History:  Diagnosis Date   Anemia    hx of   Arthritis    knees   Diabetes mellitus without complication (HCC)    type 2   Fibromyalgia    HTN (hypertension)    Hx of colonic polyps    s/p partial colectomy   Hyperlipidemia    Joint pain    in knees and back spasms   Left leg swelling    wear compression hose   Liver cirrhosis secondary to NASH  (nonalcoholic steatohepatitis) ([place]) dx nov 2014   had enlarged spleen also    Low blood pressure    bp varies   Morbid obesity ([place])    Persistent atrial fibrillation ([place])    Pneumonia 10/2016   PONV (postoperative nausea and vomiting)    in past none recent   Portal hypertension ([place])    Sleep apnea    uses cpap setting of 14   Spleen enlarged    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 [facility])    Past Surgical History:  Procedure Laterality Date   BREAST LUMPECTOMY WITH RADIOACTIVE SEED LOCALIZATION Right 08/29/2017   Procedure: RIGHT BREAST LUMPECTOMY WITH RADIOACTIVE SEEDS LOCALIZATION;  Surgeon: Erroll Luna, MD;  Location: [facility];  Service: General;  Laterality: Right;   [place]  20 yrs ago   COLECTOMY  2011   COLONOSCOPY     COLONOSCOPY N/A 09/05/2018   Procedure: COLONOSCOPY;  Surgeon: [name], MD;  Location: [facility];  Service: Endoscopy;  Laterality: N/A;   DILATATION & CURETTAGE/HYSTEROSCOPY WITH MYOSURE N/A 05/06/2016   Procedure: DILATATION & CURETTAGE/HYSTEROSCOPY WITH MYOSURE WITH POLYPECTOMY;  Surgeon: Janyth Pupa, DO;  Location: [facility];  Service: Gynecology;  Laterality: N/A;   ESOPHAGOGASTRODUODENOSCOPY (EGD) WITH PROPOFOL N/A 09/04/2013   Procedure: ESOPHAGOGASTRODUODENOSCOPY (EGD) WITH PROPOFOL;  Surgeon: [name], MD;  Location: [facility];  Service: Endoscopy;  Laterality: N/A;   KNEE ARTHROSCOPY  yrs ago   bilateral, one done x 1, one done twice   POLYPECTOMY  09/05/2018   Procedure: POLYPECTOMY;  Surgeon: [name], MD;  Location: [facility];  Service: Endoscopy;;    Current Outpatient Medications  Medication Sig Dispense Refill   albuterol (PROVENTIL HFA;VENTOLIN HFA) 108 (90 Base) MCG/ACT inhaler Inhale 2 puffs into the lungs every 6 (six) hours as needed for wheezing or shortness of breath.      bumetanide (BUMEX) 2 MG tablet Take 4 mg by mouth daily.     diltiazem  (CARDIZEM CD) 300 MG 24 hr capsule Take 1 capsule (300 mg total) by mouth daily. 30 capsule 6   JARDIANCE 25 MG TABS tablet Take 25 mg by mouth daily after breakfast.   5   Lancets (ONETOUCH DELICA PLUS [id number]) MISC Apply topically 3 (three) times daily.     megestrol (MEGACE) 20 MG tablet Take 20 mg by mouth daily.     metolazone (ZAROXOLYN) 5 MG tablet TAKE 1 EVERY OTHER DAY 1 HOUR PRIOR TO YOUR BUMEX FOR 3 DAYS ONLY OR AS DIRECTED 15 tablet 3   metoprolol tartrate (LOPRESSOR) 50 MG tablet Take 1 tablet (50 mg total) by mouth 2 (two) times daily. 180 tablet 3   mirabegron ER (MYRBETRIQ) 50 MG TB24 tablet Take 50 mg by mouth daily.     ONETOUCH VERIO test strip 1 each 3 (three) times daily.     potassium chloride (KLOR-CON SPRINKLE) 10 MEQ CR capsule TAKE 4 DAILY 120 capsule 5   saccharomyces boulardii (FLORASTOR)  250 MG capsule Take 1 capsule (250 mg total) by mouth 2 (two) times daily. 60 capsule 1   spironolactone (ALDACTONE) 25 MG tablet Take 1 tablet (25 mg total) by mouth daily. Schedule ov for further refills. 90 tablet 3   No current facility-administered medications for this encounter.    Allergies  Allergen Reactions   Celecoxib Other (See Comments)    "speech slurred" with celebrex   Shellfish Allergy Swelling and Rash    TINGLING AND SWELLING OF LIPS. LARGE WHELPS QUARTER-SIZE   Barium-Containing Compounds Other (See Comments)    TACHYCARDIA   Prednisone Other (See Comments)    TACHYCARDIA   Statins Other (See Comments)    AGGRAVATED FIBROMYALGIA   Fentanyl     Hallucinations    Ibuprofen Other (See Comments)    UNSPECIFIED SPECIFIC REACTION >> "DUE TO PLATELETS"   Tramadol     Hallucination    Tylenol [Acetaminophen] Other (See Comments)    UNSPECIFIED SPECIFIC REACTION >> "DUE TO PLATELETS"    Social History   Socioeconomic History   Marital status: Married    Spouse name: Not on file   Number of children: Not on file   Years of  education: Not on file   Highest education level: Not on file  Occupational History   Occupation: Merchant navy officer  Tobacco Use   Smoking status: Former Smoker    Packs/day: 1.00    Years: 3.00    Pack years: 3.00    Types: Cigarettes    Quit date: 09/19/1974    Years since quitting: 45.7   Smokeless tobacco: Never Used  Vaping Use   Vaping Use: Never used  Substance and Sexual Activity   Alcohol use: Yes    Comment: occ   Drug use: No   Sexual activity: Not on file  Other Topics Concern   Not on file  Social History Narrative   Lives in [place] with her husband.  Instructor for early childhood development.     Social Determinants of Health   Financial Resource Strain:    Difficulty of Paying Living Expenses: Not on file  Food Insecurity:    Worried About Charity fundraiser in the Last Year: Not on file   [employer] of Food in the Last Year: Not on file  Transportation Needs:    Lack of Transportation (Medical): Not on file   Lack of Transportation (Non-Medical): Not on file  Physical Activity:    Days of Exercise per Week: Not on file   Minutes of Exercise per Session: Not on file  Stress:    Feeling of Stress : Not on file  Social Connections:    Frequency of Communication with Friends and Family: Not on file   Frequency of Social Gatherings with Friends and Family: Not on file   Attends Religious Services: Not on file   Active Member of Clubs or Organizations: Not on file   Attends Archivist Meetings: Not on file   Marital Status: Not on file  Intimate Partner Violence:    Fear of Current or Ex-Partner: Not on file   Emotionally Abused: Not on file   Physically Abused: Not on file   Sexually Abused: Not on file     ROS- All systems are reviewed and negative except as per the HPI above.  Physical Exam: Vitals:   06/23/20 0837  BP: 118/66  Pulse: 95  Weight: (!) 157.1 kg  Height: 5' 7"  (1.702 m)  GEN- The patient  is well appearing morbidly obese female, alert and oriented x 3 today.   Head- normocephalic, atraumatic Eyes-  Sclera clear, conjunctiva pink Ears- hearing intact Oropharynx- clear Neck- supple  Lungs- Clear to ausculation bilaterally, normal work of breathing Heart- irregular rate and rhythm, no murmurs, rubs or gallops  GI- soft, NT, ND, + BS Extremities- no clubbing, cyanosis. 1+ bilateral edema MS- no significant deformity or atrophy Skin- no rash or lesion Psych- euthymic mood, full affect Neuro- strength and sensation are intact  Wt Readings from Last 3 Encounters:  06/23/20 (!) 157.1 kg  06/17/20 (!) 174 kg  05/13/20 (!) 169.2 kg    EKG today demonstrates afib HR 95, QRS 98, QTc 472  Echo 03/17/20 demonstrated  1. Left ventricular ejection fraction, by estimation, is 55 to 60%. The  left ventricle has normal function. The left ventricle has no regional  wall motion abnormalities. Left ventricular diastolic function could not  be evaluated.  2. Right ventricular systolic function is normal. The right ventricular  size is moderately enlarged. There is normal pulmonary artery systolic  pressure.  3. The mitral valve is normal in structure. No evidence of mitral valve  regurgitation.  4. Unable to determine valve morphology due to image quality. Aortic  valve regurgitation is not visualized. No aortic stenosis is present.   Conclusion(s)/Recommendation(s): No evidence of valvular vegetations on  this transthoracic echocardiogram. Would recommend a transesophageal  echocardiogram to exclude infective endocarditis if clinically indicated.  Poor technical quality due to body  habitus.   Epic records are reviewed at length today  CHA2DS2-VASc Score = 3  [name] score is based upon: CHF History: 0 HTN History: 1 Age : 0 Diabetes History: 1 Stroke History: 0 Vascular Disease History: 0 Gender: 1      ASSESSMENT AND PLAN: 1. Longstanding Persistent  Atrial Fibrillation (ICD10:  I48.11) [name] CHA2DS2-VASc score is 3, indicating a 3.2% annual risk of stroke.   Would continue with a rate control strategy given her morbid obesity and inability to take anticoagulation. No room in BP to increase rate control. We discussed AV node ablation as she has had multiple episodes of RVR associated with acute diastolic CHF exacerbations. Patient is agreeable for evaluation. Will refer to EP.  Continue diltiazem 300 mg daily Continue Lopressor 50 mg BID  2. Secondary Hypercoagulable State (ICD10:  D68.69) The patient is at significant risk for stroke/thromboembolism based upon her CHA2DS2-VASc Score of 3.  However, the patient is not on anticoagulation due to her high bleeding risk.     3. Obesity Body mass index is 54.25 kg/m. Lifestyle modification was discussed at length including regular exercise and weight reduction. Significantly contributing to her health issues.  4. Obstructive sleep apnea The importance of adequate treatment of sleep apnea was discussed today in order to improve our ability to maintain sinus rhythm long term. Encouraged compliance with CPAP therapy.  5. Chronic diastolic CHF Weight down considerably. Edema and SOB improved.  Followed by Dr Oval Linsey.    Follow up with EP to consider AV node ablation.    [facility] [address] [place], [place] [address] [phone] 06/23/2020 8:42 AM

## 2020-06-23 ENCOUNTER — Encounter (HOSPITAL_COMMUNITY): Payer: Self-pay | Admitting: Physician Assistant

## 2020-06-23 ENCOUNTER — Other Ambulatory Visit: Payer: Self-pay

## 2020-06-23 ENCOUNTER — Ambulatory Visit (HOSPITAL_COMMUNITY)
Admission: RE | Admit: 2020-06-23 | Discharge: 2020-06-23 | Disposition: A | Discharge status: Home / Self Care | Payer: BC Managed Care – PPO | Source: Ambulatory Visit | Attending: Physician Assistant | Admitting: Physician Assistant

## 2020-06-23 VITALS — BP 118/66 | HR 95 | Ht 67.0 in | Wt 346.4 lb

## 2020-06-23 DIAGNOSIS — D6869 Other thrombophilia: Secondary | ICD-10-CM | POA: Insufficient documentation

## 2020-06-23 DIAGNOSIS — E785 Hyperlipidemia, unspecified: Secondary | ICD-10-CM | POA: Insufficient documentation

## 2020-06-23 DIAGNOSIS — Z79899 Other long term (current) drug therapy: Secondary | ICD-10-CM | POA: Diagnosis not present

## 2020-06-23 DIAGNOSIS — Z7984 Long term (current) use of oral hypoglycemic drugs: Secondary | ICD-10-CM | POA: Insufficient documentation

## 2020-06-23 DIAGNOSIS — I110 Hypertensive heart disease with heart failure: Secondary | ICD-10-CM | POA: Diagnosis not present

## 2020-06-23 DIAGNOSIS — E669 Obesity, unspecified: Secondary | ICD-10-CM | POA: Insufficient documentation

## 2020-06-23 DIAGNOSIS — E119 Type 2 diabetes mellitus without complications: Secondary | ICD-10-CM | POA: Diagnosis not present

## 2020-06-23 DIAGNOSIS — Z87891 Personal history of nicotine dependence: Secondary | ICD-10-CM | POA: Diagnosis not present

## 2020-06-23 DIAGNOSIS — I5032 Chronic diastolic (congestive) heart failure: Secondary | ICD-10-CM | POA: Diagnosis not present

## 2020-06-23 DIAGNOSIS — G4733 Obstructive sleep apnea (adult) (pediatric): Secondary | ICD-10-CM | POA: Insufficient documentation

## 2020-06-23 DIAGNOSIS — I4811 Longstanding persistent atrial fibrillation: Secondary | ICD-10-CM | POA: Diagnosis not present

## 2020-06-23 DIAGNOSIS — Z6841 Body Mass Index (BMI) 40.0 and over, adult: Secondary | ICD-10-CM | POA: Insufficient documentation

## 2020-06-23 DIAGNOSIS — Z7901 Long term (current) use of anticoagulants: Secondary | ICD-10-CM | POA: Insufficient documentation

## 2020-06-23 DIAGNOSIS — M797 Fibromyalgia: Secondary | ICD-10-CM | POA: Insufficient documentation

## 2020-06-23 DIAGNOSIS — Z8249 Family history of ischemic heart disease and other diseases of the circulatory system: Secondary | ICD-10-CM | POA: Diagnosis not present

## 2020-06-25 ENCOUNTER — Telehealth: Payer: Self-pay | Admitting: Oncology

## 2020-06-25 NOTE — Telephone Encounter (Signed)
Rescheduled 10/22 to 10/26 due to provider on call, called and left a voicemail regarding rescheduled appointments.

## 2020-06-26 ENCOUNTER — Telehealth: Payer: Self-pay | Admitting: Physician Assistant

## 2020-06-26 LAB — BASIC METABOLIC PANEL
BUN/Creatinine Ratio: 27 (ref 12–28)
BUN: 36 mg/dL — ABNORMAL HIGH (ref 8–27)
CO2: 28 mmol/L (ref 20–29)
Calcium: 10.4 mg/dL — ABNORMAL HIGH (ref 8.7–10.3)
Chloride: 86 mmol/L — ABNORMAL LOW (ref 96–106)
Creatinine, Ser: 1.32 mg/dL — ABNORMAL HIGH (ref 0.57–1.00)
GFR calc Af Amer: 49 mL/min/{1.73_m2} — ABNORMAL LOW (ref >59)
GFR calc non Af Amer: 43 mL/min/{1.73_m2} — ABNORMAL LOW (ref >59)
Glucose: 248 mg/dL — ABNORMAL HIGH (ref 65–99)
Potassium: 2.5 mmol/L — CL (ref 3.5–5.2)
Sodium: 134 mmol/L (ref 134–144)

## 2020-06-26 NOTE — Telephone Encounter (Signed)
Received a call from [place] that the patient's potassium level was 2.5.  Reviewed the case with Dr. Marlou Porch and he recommends she take a total of 120 mEq within the next 24 hours.  She should also hold the Zaroxolyn.  Left a message on the patient's phone regarding the potassium and left a separate message regarding the Zaroxolyn.  Requested that she contact the answering service to confirm that she got the message.  I will try again later to reach her.  Rosaria Ferries, PA-C 06/26/2020 5:42 PM

## 2020-06-27 ENCOUNTER — Other Ambulatory Visit: Payer: Self-pay | Admitting: Physician Assistant

## 2020-06-27 ENCOUNTER — Telehealth: Payer: Self-pay | Admitting: Physician Assistant

## 2020-06-27 DIAGNOSIS — E876 Hypokalemia: Secondary | ICD-10-CM

## 2020-06-27 NOTE — Telephone Encounter (Signed)
Received phone call from [place]  [phone]) regarding critically low potassium of 2.5. I called Hannah Khan, she did not pick up, I left message with instruction to hold bumex today and double up on the potassium. I will call her in 1 hour to confirm she has received the instruction. She will need a repeat BMET on Monday as well.

## 2020-06-27 NOTE — Telephone Encounter (Signed)
I was finally able to reach the patient. She has been feeling weak today. No dizziness, feeling of passing out or AMS. She is no longer on metolazone. I spoke with Dr. Oval Linsey and instructed her to hold Bumex today and resume tomorrow. Take 45mq TID of KCl today and go down to 479m BID of KCl starting tomorrow. Will need BMET on Monday.   Staff to arrange early follow up with Dr. RaOval Linseyr APP in the office. She apparently lost 45 lbs with diureses recently.

## 2020-06-27 NOTE — Telephone Encounter (Signed)
Tried to reach both the patient and her husband, went to voice mail both times. Will try again in 1 hour.

## 2020-06-27 NOTE — Progress Notes (Signed)
bm 

## 2020-06-27 NOTE — Telephone Encounter (Signed)
Left message with patient and her husband.

## 2020-06-30 LAB — BASIC METABOLIC PANEL
BUN/Creatinine Ratio: 30 — ABNORMAL HIGH (ref 12–28)
BUN: 32 mg/dL — ABNORMAL HIGH (ref 8–27)
CO2: 24 mmol/L (ref 20–29)
Calcium: 9.8 mg/dL (ref 8.7–10.3)
Chloride: 94 mmol/L — ABNORMAL LOW (ref 96–106)
Creatinine, Ser: 1.05 mg/dL — ABNORMAL HIGH (ref 0.57–1.00)
GFR calc Af Amer: 65 mL/min/{1.73_m2} (ref >59)
GFR calc non Af Amer: 56 mL/min/{1.73_m2} — ABNORMAL LOW (ref >59)
Glucose: 330 mg/dL — ABNORMAL HIGH (ref 65–99)
Potassium: 3.7 mmol/L (ref 3.5–5.2)
Sodium: 135 mmol/L (ref 134–144)

## 2020-07-01 ENCOUNTER — Encounter: Payer: Self-pay | Admitting: Physician Assistant

## 2020-07-01 ENCOUNTER — Ambulatory Visit (INDEPENDENT_AMBULATORY_CARE_PROVIDER_SITE_OTHER): Payer: BC Managed Care – PPO | Admitting: Physician Assistant

## 2020-07-01 ENCOUNTER — Other Ambulatory Visit: Payer: Self-pay

## 2020-07-01 VITALS — BP 128/72 | HR 76 | Ht 67.0 in | Wt 353.4 lb

## 2020-07-01 DIAGNOSIS — E785 Hyperlipidemia, unspecified: Secondary | ICD-10-CM

## 2020-07-01 DIAGNOSIS — Z79899 Other long term (current) drug therapy: Secondary | ICD-10-CM | POA: Diagnosis not present

## 2020-07-01 DIAGNOSIS — Z9989 Dependence on other enabling machines and devices: Secondary | ICD-10-CM

## 2020-07-01 DIAGNOSIS — I10 Essential (primary) hypertension: Secondary | ICD-10-CM | POA: Diagnosis not present

## 2020-07-01 DIAGNOSIS — I5032 Chronic diastolic (congestive) heart failure: Secondary | ICD-10-CM

## 2020-07-01 DIAGNOSIS — I482 Chronic atrial fibrillation, unspecified: Secondary | ICD-10-CM

## 2020-07-01 DIAGNOSIS — G4733 Obstructive sleep apnea (adult) (pediatric): Secondary | ICD-10-CM

## 2020-07-01 DIAGNOSIS — E119 Type 2 diabetes mellitus without complications: Secondary | ICD-10-CM

## 2020-07-01 DIAGNOSIS — E876 Hypokalemia: Secondary | ICD-10-CM

## 2020-07-01 NOTE — Progress Notes (Signed)
   Primary Cardiologist: Skeet Latch, MD  Mrs. Hannah Khan was seen in the cardiology office on 07/01/2020, we recommend she hold off going back to work until 07/06/2020 by which time her overall condition should be more stabilized for remote teaching.   Thank you for your understanding.  Almyra Deforest, [place] 07/01/2020, 9:35 AM

## 2020-07-01 NOTE — Patient Instructions (Addendum)
Medication Instructions:   Restart Bumex 4 mg daily  Take Potassium 40 meq (4-10 meq tablets) 2 times a day  Hold Metolazone until further instructed   *If you need a refill on your cardiac medications before your next appointment, please call your pharmacy*  Lab Work: Your physician recommends that you return for lab work on: 07/06/20  BMET If you have labs (blood work) drawn today and your tests are completely normal, you will receive your results only by:  [place] (if you have [place]) OR  A paper copy in the mail If you have any lab test that is abnormal or we need to change your treatment, we will call you to review the results.  Testing/Procedures: NONE ordered at this time of appointment   Follow-Up: At [facility], you and your health needs are our priority.  As part of our continuing mission to provide you with exceptional heart care, we have created designated Provider Care Teams.  These Care Teams include your primary Cardiologist (physician) and Advanced Practice Providers (APPs -  Physician Assistants and Nurse Practitioners) who all work together to provide you with the care you need, when you need it.  Your next appointment:   1 week(s)  The format for your next appointment:   In Person  Provider:   Almyra Deforest, PA-C  Other Instructions

## 2020-07-01 NOTE — Progress Notes (Signed)
Cardiology Office Note:    Date:  07/03/2020   ID:  [name], DOB 02/27/1956, MRN [id number]  PCP:  Lujean Amel, MD  [facility] Cardiologist:  Skeet Latch, MD  [place] Electrophysiologist:  None   Referring MD: Lujean Amel, MD   Chief Complaint  Patient presents with  . Follow-up    seen for Dr. Oval Linsey    History of Present Illness:    Hannah Khan is a 64 y.o. female with a hx of hypertension, hyperlipidemia, DM 2, cryptogenic cirrhosis, thrombocytopenia, OSA on CPAP, chronic atrial fibrillation and morbid obesity.  She was admitted with A. fib with RVR in March 2017.  Due to her history of portal hypertension and cirrhosis, she was not started on anticoagulation therapy.  She was referred to Dr. Rayann Heman for consideration of watchman device, however she was felt not to be a good candidate as she was unable to be on antiplatelet for 1 month and that she was unlikely to remain in sinus rhythm without significant weight loss.  She had recurrent atrial fibrillation during her hospitalization in July and August 2019 due to cellulitis.  When the patient was seen in July 2021, her heart rate was 110 bpm and she was volume overloaded.  Bumex was increased to 4 mg twice a day for 3 days.  She was last seen by Dr. Oval Linsey on 06/17/2020, she was still tachycardic, unable to tolerate digoxin due to headache.  She was also volume overloaded, her Bumex was increased to 4 mg daily.  She was also given 5 mg of metolazone to be taken for the next 3 days.  She was seen by A. fib clinic on 06/23/2020 at which time AV nodal ablation with pacemaker implantation was discussed with the patient, she was subsequently referred to EP.  She did have significant weight loss on the 3 days of metolazone.  Lab work obtained on 07/18/2020 showed severe hypokalemia with potassium of 2.5.  Creatinine was mildly elevated at 1.32.  I instructed her to take 40 mg 3 times daily of potassium for 1 day  before going down to 40 mEq twice daily of potassium supplement.  I also asked her to hold Bumex for 1 day before resuming.  Follow-up lab work shows significantly improved renal function and potassium level.  She presents today for follow-up.  Patient presents today accompanied by her husband.  Her weight is 30 pounds lower than her previous weight 2 weeks ago.  However she says this is after she has gained 10 pounds back over the weekend.  She is still holding her Bumex.  Patient is fairly weak, however however her mental ability has improved from the weekend.  I recommend that she restart on the Bumex dose added the previous level 4 mg daily.  I want her to continue to hold the metolazone at this time.  She should be on 40 mEq twice daily of potassium supplement at this time.  I plan to obtain a basic metabolic panel next Monday and follow-up after that to determine if she need a weekly dosing of metolazone to help maintain her overall volume.  Past Medical History:  Diagnosis Date  . Anemia    hx of  . Arthritis    knees  . Diabetes mellitus without complication ([place])    type 2  . Fibromyalgia   . HTN (hypertension)   . Hx of colonic polyps    s/p partial colectomy  . Hyperlipidemia   .  Joint pain    in knees and back spasms  . Left leg swelling    wear compression hose  . Liver cirrhosis secondary to NASH (nonalcoholic steatohepatitis) ([place]) dx nov 2014   had enlarged spleen also   . Low blood pressure    bp varies  . Morbid obesity ([place])   . Persistent atrial fibrillation ([place])   . Pneumonia 10/2016  . PONV (postoperative nausea and vomiting)    in past none recent  . Portal hypertension ([place])   . Sleep apnea    uses cpap setting of 14  . Spleen enlarged   . 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 [facility])     Past Surgical History:  Procedure Laterality Date  . BREAST LUMPECTOMY WITH RADIOACTIVE SEED LOCALIZATION Right 08/29/2017   Procedure: RIGHT BREAST LUMPECTOMY WITH  RADIOACTIVE SEEDS LOCALIZATION;  Surgeon: Erroll Luna, MD;  Location: [facility];  Service: General;  Laterality: Right;  . CESAREAN SECTION  1989  . CHOLECYSTECTOMY  20 yrs ago  . COLECTOMY  2011  . COLONOSCOPY    . COLONOSCOPY N/A 09/05/2018   Procedure: COLONOSCOPY;  Surgeon: [name], MD;  Location: [facility];  Service: Endoscopy;  Laterality: N/A;  . DILATATION & CURETTAGE/HYSTEROSCOPY WITH MYOSURE N/A 05/06/2016   Procedure: DILATATION & CURETTAGE/HYSTEROSCOPY WITH MYOSURE WITH POLYPECTOMY;  Surgeon: Janyth Pupa, DO;  Location: [facility];  Service: Gynecology;  Laterality: N/A;  . ESOPHAGOGASTRODUODENOSCOPY (EGD) WITH PROPOFOL N/A 09/04/2013   Procedure: ESOPHAGOGASTRODUODENOSCOPY (EGD) WITH PROPOFOL;  Surgeon: [name], MD;  Location: [facility];  Service: Endoscopy;  Laterality: N/A;  . KNEE ARTHROSCOPY  yrs ago   bilateral, one done x 1, one done twice  . POLYPECTOMY  09/05/2018   Procedure: POLYPECTOMY;  Surgeon: [name], MD;  Location: [facility];  Service: Endoscopy;;    Current Medications: Current Meds  Medication Sig  . albuterol (PROVENTIL HFA;VENTOLIN HFA) 108 (90 Base) MCG/ACT inhaler Inhale 2 puffs into the lungs every 6 (six) hours as needed for wheezing or shortness of breath.   . bumetanide (BUMEX) 2 MG tablet Take 4 mg by mouth daily.  Marland Kitchen diltiazem (CARDIZEM CD) 300 MG 24 hr capsule Take 1 capsule (300 mg total) by mouth daily.  Marland Kitchen JARDIANCE 25 MG TABS tablet Take 25 mg by mouth daily after breakfast.   . Lancets (ONETOUCH DELICA PLUS [id number]) MISC Apply topically 3 (three) times daily.  . metolazone (ZAROXOLYN) 5 MG tablet TAKE 1 EVERY OTHER DAY 1 HOUR PRIOR TO YOUR BUMEX FOR 3 DAYS ONLY OR AS DIRECTED  . metoprolol tartrate (LOPRESSOR) 50 MG tablet Take 1 tablet (50 mg total) by mouth 2 (two) times daily.  . mirabegron ER (MYRBETRIQ) 50 MG TB24 tablet Take 50 mg by mouth daily.  Glory Rosebush VERIO test strip 1 each 3 (three) times daily.  .  potassium chloride (KLOR-CON SPRINKLE) 10 MEQ CR capsule TAKE 4 DAILY (Patient taking differently: Take 40 mEq by mouth 2 (two) times daily. )  . spironolactone (ALDACTONE) 25 MG tablet Take 1 tablet (25 mg total) by mouth daily. Schedule ov for further refills.     Allergies:   Celecoxib, Shellfish allergy, Barium-containing compounds, Prednisone, Statins, Fentanyl, Ibuprofen, Tramadol, and Tylenol [acetaminophen]   Social History   Socioeconomic History  . Marital status: Married    Spouse name: Not on file  . Number of children: Not on file  . Years of education: Not on file  . Highest education level: Not on file  Occupational History  .  Occupation: Merchant navy officer  Tobacco Use  . Smoking status: Former Smoker    Packs/day: 1.00    Years: 3.00    Pack years: 3.00    Types: Cigarettes    Quit date: 09/19/1974    Years since quitting: 45.8  . Smokeless tobacco: Never Used  Vaping Use  . Vaping Use: Never used  Substance and Sexual Activity  . Alcohol use: Yes    Comment: occ  . Drug use: No  . Sexual activity: Not on file  Other Topics Concern  . Not on file  Social History Narrative   Lives in [place] with her husband.  Instructor for early childhood development.     Social Determinants of Health   Financial Resource Strain:   . Difficulty of Paying Living Expenses: Not on file  Food Insecurity:   . Worried About Charity fundraiser in the Last Year: Not on file  . Ran Out of Food in the Last Year: Not on file  Transportation Needs:   . Lack of Transportation (Medical): Not on file  . Lack of Transportation (Non-Medical): Not on file  Physical Activity:   . Days of Exercise per Week: Not on file  . Minutes of Exercise per Session: Not on file  Stress:   . Feeling of Stress : Not on file  Social Connections:   . Frequency of Communication with Friends and Family: Not on file  . Frequency of Social Gatherings with Friends and Family: Not on file  . Attends  Religious Services: Not on file  . Active Member of Clubs or Organizations: Not on file  . Attends Archivist Meetings: Not on file  . Marital Status: Not on file     Family History: [name] family history includes Cancer in her brother, father, and mother; Congestive Heart Failure in her mother; Heart failure in her mother; Hyperlipidemia in her brother and mother; Lung cancer in her father; Stroke in her mother and another family member.  ROS:   Please see the history of present illness.     All other systems reviewed and are negative.  EKGs/Labs/Other Studies Reviewed:    The following studies were reviewed today:  Echo 03/17/2020 1. Left ventricular ejection fraction, by estimation, is 55 to 60%. The  left ventricle has normal function. The left ventricle has no regional  wall motion abnormalities. Left ventricular diastolic function could not  be evaluated.  2. Right ventricular systolic function is normal. The right ventricular  size is moderately enlarged. There is normal pulmonary artery systolic  pressure.  3. The mitral valve is normal in structure. No evidence of mitral valve  regurgitation.  4. Unable to determine valve morphology due to image quality. Aortic  valve regurgitation is not visualized. No aortic stenosis is present.   EKG:  EKG is not ordered today.   Recent Labs: 03/15/2020: B Natriuretic Peptide 340.2; Magnesium 1.9; TSH 2.120 03/16/2020: ALT 33 03/18/2020: Hemoglobin 10.8; Platelets 45 06/29/2020: BUN 32; Creatinine, Ser 1.05; Potassium 3.7; Sodium 135  Recent Lipid Panel    Component Value Date/Time   CHOL 175 07/02/2014 1138   TRIG 93 07/02/2014 1138   HDL 59 07/02/2014 1138   CHOLHDL 3.0 07/02/2014 1138   VLDL 19 07/02/2014 1138   LDLCALC 97 07/02/2014 1138     Risk Assessment/Calculations:     CHA2DS2-VASc Score = 4  This indicates a 4.8% annual risk of stroke. [name] score is based upon: CHF History: 1  HTN  History: 1 Diabetes History: 1 Stroke History: 0 Vascular Disease History: 0 Age Score: 0 Gender Score: 1      Physical Exam:    VS:  BP 128/72   Pulse 76   Ht 5' 7"  (1.702 m)   Wt (!) 353 lb 6.4 oz (160.3 kg)   BMI 55.35 kg/m     Wt Readings from Last 3 Encounters:  07/01/20 (!) 353 lb 6.4 oz (160.3 kg)  06/23/20 (!) 346 lb 6.4 oz (157.1 kg)  06/17/20 (!) 383 lb 9.6 oz (174 kg)     GEN:  Well nourished, well developed in no acute distress HEENT: Normal NECK: No JVD; No carotid bruits LYMPHATICS: No lymphadenopathy CARDIAC: Irregularly irregular, no murmurs, rubs, gallops RESPIRATORY:  Clear to auscultation without rales, wheezing or rhonchi  ABDOMEN: Soft, non-tender, non-distended MUSCULOSKELETAL:  No edema; No deformity  SKIN: Warm and dry NEUROLOGIC:  Alert and oriented x 3 PSYCHIATRIC:  Normal affect   ASSESSMENT:    1. Chronic diastolic CHF (congestive heart failure) ([place])   2. Medication management   3. Primary hypertension   4. Hyperlipidemia LDL goal <70   5. Controlled type 2 diabetes mellitus without complication, without long-term current use of insulin ([place])   6. OSA on CPAP   7. Chronic atrial fibrillation (HCC)   8. Hypokalemia    PLAN:    In order of problems listed above:  1. Chronic diastolic heart failure: She was given 3 doses of metolazone on the last visit, however she ended up losing roughly 40 pounds and had a severe hypokalemia.  Since then, she has gained roughly 10 pounds back.  Renal function stable.  I asked her to continue on the Bumex 4 mg daily.  Her metolazone is currently on hold, depend on her weight changes for the next week, I likely will restart metolazone at once weekly dosing.  2. Hypertension: Blood pressure stable  3. Hyperlipidemia: She is not on any statin medication due to intolerance  4. DM2: Managed by primary care provider  5. Obstructive sleep apnea: On CPAP therapy  6. Chronic atrial fibrillation: Continue  metoprolol and diltiazem.  Patient is not on anticoagulation due to history of cirrhosis and portal hypertension.  Previously seen by EP service, discussed possibility of pacemaker implantation and AV node ablation.  7. Hypokalemia: Potassium improved on repeat lab work.  She should be on 40 mEq twice daily of potassium supplement.   Medication Adjustments/Labs and Tests Ordered: Current medicines are reviewed at length with the patient today.  Concerns regarding medicines are outlined above.  Orders Placed This Encounter  Procedures  . Basic metabolic panel   No orders of the defined types were placed in this encounter.   Patient Instructions  Medication Instructions:   Restart Bumex 4 mg daily  Take Potassium 40 meq (4-10 meq tablets) 2 times a day  Hold Metolazone until further instructed   *If you need a refill on your cardiac medications before your next appointment, please call your pharmacy*  Lab Work: Your physician recommends that you return for lab work on: 07/06/20  BMET If you have labs (blood work) drawn today and your tests are completely normal, you will receive your results only by: Marland Kitchen MyChart Message (if you have MyChart) OR . A paper copy in the mail If you have any lab test that is abnormal or we need to change your treatment, we will call you to review the results.  Testing/Procedures: NONE ordered  at this time of appointment   Follow-Up: At [facility], you and your health needs are our priority.  As part of our continuing mission to provide you with exceptional heart care, we have created designated Provider Care Teams.  These Care Teams include your primary Cardiologist (physician) and Advanced Practice Providers (APPs -  Physician Assistants and Nurse Practitioners) who all work together to provide you with the care you need, when you need it.  Your next appointment:   1 week(s)  The format for your next appointment:   In Person  Provider:     Almyra Deforest, PA-C  Other Instructions      Signed, Almyra Deforest, [place]  07/03/2020 8:06 PM    [place]

## 2020-07-03 ENCOUNTER — Encounter: Payer: Self-pay | Admitting: Physician Assistant

## 2020-07-06 ENCOUNTER — Other Ambulatory Visit: Payer: Self-pay | Admitting: Gastroenterology

## 2020-07-06 DIAGNOSIS — K7460 Unspecified cirrhosis of liver: Secondary | ICD-10-CM

## 2020-07-07 ENCOUNTER — Institutional Professional Consult (permissible substitution): Payer: BC Managed Care – PPO | Admitting: Internal Medicine

## 2020-07-09 ENCOUNTER — Ambulatory Visit: Payer: BC Managed Care – PPO | Admitting: Physician Assistant

## 2020-07-10 ENCOUNTER — Ambulatory Visit: Payer: BC Managed Care – PPO | Admitting: Oncology

## 2020-07-10 ENCOUNTER — Other Ambulatory Visit: Payer: BC Managed Care – PPO

## 2020-07-14 ENCOUNTER — Other Ambulatory Visit: Payer: Self-pay | Admitting: Oncology

## 2020-07-14 ENCOUNTER — Inpatient Hospital Stay: Payer: BC Managed Care – PPO | Attending: Oncology

## 2020-07-14 ENCOUNTER — Other Ambulatory Visit: Payer: Self-pay

## 2020-07-14 ENCOUNTER — Inpatient Hospital Stay (HOSPITAL_BASED_OUTPATIENT_CLINIC_OR_DEPARTMENT_OTHER): Payer: BC Managed Care – PPO | Admitting: Oncology

## 2020-07-14 VITALS — BP 119/59 | HR 88 | Temp 98.7°F | Resp 18 | Ht 67.0 in | Wt 381.1 lb

## 2020-07-14 DIAGNOSIS — D696 Thrombocytopenia, unspecified: Secondary | ICD-10-CM | POA: Diagnosis present

## 2020-07-14 DIAGNOSIS — K7460 Unspecified cirrhosis of liver: Secondary | ICD-10-CM | POA: Diagnosis not present

## 2020-07-14 DIAGNOSIS — Z79899 Other long term (current) drug therapy: Secondary | ICD-10-CM | POA: Diagnosis not present

## 2020-07-14 LAB — CBC WITH DIFFERENTIAL (CANCER CENTER ONLY)
Abs Immature Granulocytes: 0.03 10*3/uL (ref 0.00–0.07)
Basophils Absolute: 0 10*3/uL (ref 0.0–0.1)
Basophils Relative: 1 %
Eosinophils Absolute: 0 10*3/uL (ref 0.0–0.5)
Eosinophils Relative: 1 %
HCT: 34.3 % — ABNORMAL LOW (ref 36.0–46.0)
Hemoglobin: 10.9 g/dL — ABNORMAL LOW (ref 12.0–15.0)
Immature Granulocytes: 1 %
Lymphocytes Relative: 19 %
Lymphs Abs: 0.7 10*3/uL (ref 0.7–4.0)
MCH: 27 pg (ref 26.0–34.0)
MCHC: 31.8 g/dL (ref 30.0–36.0)
MCV: 85.1 fL (ref 80.0–100.0)
Monocytes Absolute: 0.3 10*3/uL (ref 0.1–1.0)
Monocytes Relative: 10 %
Neutro Abs: 2.3 10*3/uL (ref 1.7–7.7)
Neutrophils Relative %: 68 %
Platelet Count: 32 10*3/uL — ABNORMAL LOW (ref 150–400)
RBC: 4.03 MIL/uL (ref 3.87–5.11)
RDW: 19.6 % — ABNORMAL HIGH (ref 11.5–15.5)
WBC Count: 3.4 10*3/uL — ABNORMAL LOW (ref 4.0–10.5)
nRBC: 0 % (ref 0.0–0.2)

## 2020-07-14 NOTE — Progress Notes (Signed)
Hematology and Oncology Follow Up Visit  Hannah Khan [id number] 1955/11/25 64 y.o. 07/14/2020 1:02 PM  CC: [name], MD [name], M.D. Leighton Ruff. Redmond Pulling, MD Bo Merino, M.D.    Principle Diagnosis: 64 year old woman with thrombocytopenia diagnosed in 2010.  She was found to have cirrhosis of the liver and splenic sequestration as the cause of her thrombocytopenia. .    Prior therapy: Nplate given in 0626 in preparation for surgical intervention.  Current therapy: Active surveillance  Interim History: Hannah Khan returns today for a follow-up visit.  Since her last visit, she reports no major changes in her health. She denies any recent hospitalization or illnesses. She denies any increased bleeding clotting hematochezia or melena. He denies any epistaxis or hemoptysis. He does report occasional bruising.       Medications: Unchanged on review. Current Outpatient Medications  Medication Sig Dispense Refill  . albuterol (PROVENTIL HFA;VENTOLIN HFA) 108 (90 Base) MCG/ACT inhaler Inhale 2 puffs into the lungs every 6 (six) hours as needed for wheezing or shortness of breath.     . bumetanide (BUMEX) 2 MG tablet Take 4 mg by mouth daily.    Marland Kitchen diltiazem (CARDIZEM CD) 300 MG 24 hr capsule Take 1 capsule (300 mg total) by mouth daily. 30 capsule 6  . JARDIANCE 25 MG TABS tablet Take 25 mg by mouth daily after breakfast.   5  . Lancets (ONETOUCH DELICA PLUS [id number]) MISC Apply topically 3 (three) times daily.    . metolazone (ZAROXOLYN) 5 MG tablet TAKE 1 EVERY OTHER DAY 1 HOUR PRIOR TO YOUR BUMEX FOR 3 DAYS ONLY OR AS DIRECTED 15 tablet 3  . metoprolol tartrate (LOPRESSOR) 50 MG tablet Take 1 tablet (50 mg total) by mouth 2 (two) times daily. 180 tablet 3  . mirabegron ER (MYRBETRIQ) 50 MG TB24 tablet Take 50 mg by mouth daily.    Glory Rosebush VERIO test strip 1 each 3 (three) times daily.    . potassium chloride (KLOR-CON SPRINKLE) 10 MEQ CR capsule TAKE 4 DAILY  (Patient taking differently: Take 40 mEq by mouth 2 (two) times daily. ) 120 capsule 5  . spironolactone (ALDACTONE) 25 MG tablet Take 1 tablet (25 mg total) by mouth daily. Schedule ov for further refills. 90 tablet 3   No current facility-administered medications for this visit.    Allergies:  Allergies  Allergen Reactions  . Celecoxib Other (See Comments)    "speech slurred" with celebrex  . Shellfish Allergy Swelling and Rash    TINGLING AND SWELLING OF LIPS. LARGE WHELPS QUARTER-SIZE  . Barium-Containing Compounds Other (See Comments)    TACHYCARDIA  . Prednisone Other (See Comments)    TACHYCARDIA  . Statins Other (See Comments)    AGGRAVATED FIBROMYALGIA  . Fentanyl     Hallucinations   . Ibuprofen Other (See Comments)    UNSPECIFIED SPECIFIC REACTION >> "DUE TO PLATELETS"  . Tramadol     Hallucination   . Tylenol [Acetaminophen] Other (See Comments)    UNSPECIFIED SPECIFIC REACTION >> "DUE TO PLATELETS"       Physical Exam:  Blood pressure (!) 119/59, pulse 88, temperature 98.7 F (37.1 C), temperature source Tympanic, resp. rate 18, height 5' 7"  (1.702 m), weight (!) 381 lb 1.6 oz (172.9 kg), SpO2 96 %.      ECOG: 1    General appearance: Comfortable appearing without any discomfort Head: Normocephalic without any trauma Oropharynx: Mucous membranes are moist and pink without any thrush or ulcers.  Eyes: Pupils are equal and round reactive to light. Lymph nodes: No cervical, supraclavicular, inguinal or axillary lymphadenopathy.   Heart:regular rate and rhythm.  S1 and S2. Bilateral lower extremity edema noted. Lung: Clear without any rhonchi or wheezes.  No dullness to percussion. Abdomin: Soft, nontender, nondistended with good bowel sounds.  No hepatosplenomegaly. Musculoskeletal: No joint deformity or effusion.  Full range of motion noted. Neurological: No deficits noted on motor, sensory and deep tendon reflex exam. Skin: No petechial rash or  dryness.  Appeared moist.         Lab Results: Lab Results  Component Value Date   WBC 3.2 (L) 03/18/2020   HGB 10.8 (L) 03/18/2020   HCT 32.2 (L) 03/18/2020   MCV 91.7 03/18/2020   PLT 45 (L) 03/18/2020     Chemistry      Component Value Date/Time   NA 135 06/29/2020 1251   NA 142 08/01/2014 1509   K 3.7 06/29/2020 1251   K 4.1 08/01/2014 1509   CL 94 (L) 06/29/2020 1251   CL 107 08/03/2012 1449   CO2 24 06/29/2020 1251   CO2 23 08/01/2014 1509   BUN 32 (H) 06/29/2020 1251   BUN 16.9 08/01/2014 1509   CREATININE 1.05 (H) 06/29/2020 1251   CREATININE 0.77 06/28/2016 1157   CREATININE 0.7 08/01/2014 1509      Component Value Date/Time   CALCIUM 9.8 06/29/2020 1251   CALCIUM 9.3 08/01/2014 1509   ALKPHOS 51 03/16/2020 0219   ALKPHOS 82 08/01/2014 1509   AST 62 (H) 03/16/2020 0219   AST 54 (H) 08/01/2014 1509   ALT 33 03/16/2020 0219   ALT 30 08/01/2014 1509   BILITOT 6.1 (H) 03/16/2020 0219   BILITOT 1.71 (H) 08/01/2014 4736       64 year old woman with:  1.  Thrombocytopenia related to cirrhosis of the liver and splenic sequestration diagnosed in 2010.    The natural course of her disease was reviewed and laboratory data in the last 11 years were discussed.  Her platelet count has fluctuated to close to 50,000 without any need for intervention recently.  Platelet transfusion or growth factor support would be needed if active bleeding is noted or surgical procedure is contemplated.   Platelet count today reviewed currently at 32,000 without any active bleeding. No need for any growth factor support or intervention.  2.  Humerus fracture: She is under evaluation for possible surgical intervention. She still has limited range of motion and chronic pain.  3.  Cirrhosis of the liver: No decompensation or hospitalizations noted.  4  Follow-up: She will return in 6 months for a repeat evaluation.  30  minutes were spent on this encounter.  Time was dedicated to  reviewing disease status, reviewing laboratory data as well as management options for the future.       Roxy Cedar [facility] 10/26/20211:02 PM

## 2020-07-15 ENCOUNTER — Encounter: Payer: Self-pay | Admitting: *Deleted

## 2020-07-15 LAB — BASIC METABOLIC PANEL
BUN/Creatinine Ratio: 17 (ref 12–28)
BUN: 15 mg/dL (ref 8–27)
CO2: 23 mmol/L (ref 20–29)
Calcium: 8.9 mg/dL (ref 8.7–10.3)
Chloride: 104 mmol/L (ref 96–106)
Creatinine, Ser: 0.88 mg/dL (ref 0.57–1.00)
GFR calc Af Amer: 80 mL/min/{1.73_m2} (ref >59)
GFR calc non Af Amer: 70 mL/min/{1.73_m2} (ref >59)
Glucose: 147 mg/dL — ABNORMAL HIGH (ref 65–99)
Potassium: 3.9 mmol/L (ref 3.5–5.2)
Sodium: 139 mmol/L (ref 134–144)

## 2020-07-21 ENCOUNTER — Ambulatory Visit
Admission: RE | Admit: 2020-07-21 | Discharge: 2020-07-21 | Disposition: A | Discharge status: Home / Self Care | Payer: BC Managed Care – PPO | Source: Ambulatory Visit | Attending: Gastroenterology | Admitting: Gastroenterology

## 2020-07-21 DIAGNOSIS — K7460 Unspecified cirrhosis of liver: Secondary | ICD-10-CM

## 2020-08-02 ENCOUNTER — Ambulatory Visit: Payer: BC Managed Care – PPO | Attending: Internal Medicine

## 2020-08-02 DIAGNOSIS — Z23 Encounter for immunization: Secondary | ICD-10-CM

## 2020-08-02 NOTE — Progress Notes (Signed)
   Covid-19 Vaccination Clinic  Name:  Hannah Khan    MRN: [id number] DOB: June 27, 1956  08/02/2020  Hannah Khan was observed post Covid-19 immunization for 15 minutes without incident. She was provided with Vaccine Information Sheet and instruction to access the V-Safe system.   Hannah Khan was instructed to call 911 with any severe reactions post vaccine: Marland Kitchen Difficulty breathing  . Swelling of face and throat  . A fast heartbeat  . A bad rash all over body  . Dizziness and weakness   Immunizations Administered    Name Date Dose VIS Date Route   Pfizer COVID-19 Vaccine 08/02/2020  9:37 AM 0.3 mL 07/08/2020 Intramuscular   Manufacturer: [place]   Lot: [id number]   [place]: [id number]

## 2020-08-05 ENCOUNTER — Encounter: Payer: Self-pay | Admitting: Internal Medicine

## 2020-08-05 ENCOUNTER — Ambulatory Visit (INDEPENDENT_AMBULATORY_CARE_PROVIDER_SITE_OTHER): Payer: BC Managed Care – PPO | Admitting: Internal Medicine

## 2020-08-05 ENCOUNTER — Other Ambulatory Visit: Payer: Self-pay

## 2020-08-05 VITALS — BP 124/70 | HR 80 | Ht 67.0 in | Wt 352.0 lb

## 2020-08-05 DIAGNOSIS — I4819 Other persistent atrial fibrillation: Secondary | ICD-10-CM

## 2020-08-05 MED ORDER — DILTIAZEM HCL ER COATED BEADS 300 MG PO CP24
300.0000 mg | ORAL_CAPSULE | Freq: Every day | ORAL | 3 refills | Status: DC
Start: 2020-08-05 — End: 2021-01-22

## 2020-08-05 NOTE — Progress Notes (Signed)
HPI Mrs. Marxen is referred by Adline Peals for evaluation of atrial fib with a RVR. She is a pleasant morbidly obese woman with cirrhosis who has had uncontrolled atrial fib and diastolic heart failure. She has done fairly well over the past few months since starting metolazone. She does not have palpitations. She checks her HR and it is mostly now below 100/min. She is not on systemic anti-coagulation due to cirrhosis and variceal bleeding. She has not been able to lose much weight. She has not had syncope. Her VR has been controlled with a combination of a cardizem and metoprolol. Allergies  Allergen Reactions   Celecoxib Other (See Comments)    "speech slurred" with celebrex   Shellfish Allergy Swelling and Rash    TINGLING AND SWELLING OF LIPS. LARGE WHELPS QUARTER-SIZE   Barium-Containing Compounds Other (See Comments)    TACHYCARDIA   Prednisone Other (See Comments)    TACHYCARDIA   Statins Other (See Comments)    AGGRAVATED FIBROMYALGIA   Fentanyl     Hallucinations    Ibuprofen Other (See Comments)    UNSPECIFIED SPECIFIC REACTION >> "DUE TO PLATELETS"   Tramadol     Hallucination    Tylenol [Acetaminophen] Other (See Comments)    UNSPECIFIED SPECIFIC REACTION >> "DUE TO PLATELETS"     Current Outpatient Medications  Medication Sig Dispense Refill   albuterol (PROVENTIL HFA;VENTOLIN HFA) 108 (90 Base) MCG/ACT inhaler Inhale 2 puffs into the lungs every 6 (six) hours as needed for wheezing or shortness of breath.      bumetanide (BUMEX) 2 MG tablet Take 4 mg by mouth daily.     diltiazem (CARDIZEM CD) 300 MG 24 hr capsule Take 1 capsule (300 mg total) by mouth daily. 30 capsule 6   JARDIANCE 25 MG TABS tablet Take 25 mg by mouth daily after breakfast.   5   Lancets (ONETOUCH DELICA PLUS GLOVFI43P) MISC Apply topically 3 (three) times daily.     metolazone (ZAROXOLYN) 5 MG tablet TAKE 1 EVERY OTHER DAY 1 HOUR PRIOR TO YOUR BUMEX FOR 3 DAYS ONLY OR  AS DIRECTED 15 tablet 3   metoprolol tartrate (LOPRESSOR) 50 MG tablet Take 1 tablet (50 mg total) by mouth 2 (two) times daily. 180 tablet 3   mirabegron ER (MYRBETRIQ) 50 MG TB24 tablet Take 50 mg by mouth daily.     ONETOUCH VERIO test strip 1 each 3 (three) times daily.     potassium chloride (KLOR-CON SPRINKLE) 10 MEQ CR capsule TAKE 4 DAILY 120 capsule 5   spironolactone (ALDACTONE) 25 MG tablet Take 1 tablet (25 mg total) by mouth daily. Schedule ov for further refills. 90 tablet 3   No current facility-administered medications for this visit.     Past Medical History:  Diagnosis Date   Anemia    hx of   Arthritis    knees   Diabetes mellitus without complication (HCC)    type 2   Fibromyalgia    HTN (hypertension)    Hx of colonic polyps    s/p partial colectomy   Hyperlipidemia    Joint pain    in knees and back spasms   Left leg swelling    wear compression hose   Liver cirrhosis secondary to NASH (nonalcoholic steatohepatitis) ([place]) dx nov 2014   had enlarged spleen also    Low blood pressure    bp varies   Morbid obesity (HCC)    Persistent atrial fibrillation ([place])  Pneumonia 10/2016   PONV (postoperative nausea and vomiting)    in past none recent   Portal hypertension (HCC)    Sleep apnea    uses cpap setting of 14   Spleen enlarged    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   Past Surgical History:  Procedure Laterality Date   BREAST LUMPECTOMY WITH RADIOACTIVE SEED LOCALIZATION Right 08/29/2017   Procedure: RIGHT BREAST LUMPECTOMY WITH RADIOACTIVE SEEDS LOCALIZATION;  Surgeon: Erroll Luna, MD;  Location: [facility];  Service: General;  Laterality: Right;   [place]  20 yrs ago   COLECTOMY  2011   COLONOSCOPY     COLONOSCOPY N/A 09/05/2018   Procedure: COLONOSCOPY;  Surgeon: [name], MD;  Location:  [facility];  Service: Endoscopy;  Laterality: N/A;   DILATATION & CURETTAGE/HYSTEROSCOPY WITH MYOSURE N/A 05/06/2016   Procedure: DILATATION & CURETTAGE/HYSTEROSCOPY WITH MYOSURE WITH POLYPECTOMY;  Surgeon: Janyth Pupa, DO;  Location: [facility];  Service: Gynecology;  Laterality: N/A;   ESOPHAGOGASTRODUODENOSCOPY (EGD) WITH PROPOFOL N/A 09/04/2013   Procedure: ESOPHAGOGASTRODUODENOSCOPY (EGD) WITH PROPOFOL;  Surgeon: [name], MD;  Location: [facility];  Service: Endoscopy;  Laterality: N/A;   KNEE ARTHROSCOPY  yrs ago   bilateral, one done x 1, one done twice   POLYPECTOMY  09/05/2018   Procedure: POLYPECTOMY;  Surgeon: [name], MD;  Location: [facility];  Service: Endoscopy;;     Family History  Problem Relation Age of Onset   Heart failure Mother    Cancer Mother    Hyperlipidemia Mother    Congestive Heart Failure Mother    Stroke Mother    Lung cancer Father    Cancer Father    Cancer Brother    Hyperlipidemia Brother    Stroke Other      Social History   Socioeconomic History   Marital status: Married    Spouse name: Not on file   Number of children: Not on file   Years of education: Not on file   Highest education level: Not on file  Occupational History   Occupation: Merchant navy officer  Tobacco Use   Smoking status: Former Smoker    Packs/day: 1.00    Years: 3.00    Pack years: 3.00    Types: Cigarettes    Quit date: 09/19/1974    Years since quitting: 45.9   Smokeless tobacco: Never Used  Vaping Use   Vaping Use: Never used  Substance and Sexual Activity   Alcohol use: Yes    Comment: occ   Drug use: No   Sexual activity: Not on file  Other Topics Concern   Not on file  Social History Narrative   Lives in [place] with her husband.  Instructor for early childhood development.     Social Determinants of Health   Financial Resource Strain:    Difficulty of Paying Living Expenses: Not on file  Food  Insecurity:    Worried About Charity fundraiser in the Last Year: Not on file   [employer] of Food in the Last Year: Not on file  Transportation Needs:    Lack of Transportation (Medical): Not on file   Lack of Transportation (Non-Medical): Not on file  Physical Activity:    Days of Exercise per Week: Not on file   Minutes of Exercise per Session: Not on file  Stress:  Feeling of Stress : Not on file  Social Connections:    Frequency of Communication with Friends and Family: Not on file   Frequency of Social Gatherings with Friends and Family: Not on file   Attends Religious Services: Not on file   Active Member of Clubs or Organizations: Not on file   Attends Archivist Meetings: Not on file   Marital Status: Not on file  Intimate Partner Violence:    Fear of Current or Ex-Partner: Not on file   Emotionally Abused: Not on file   Physically Abused: Not on file   Sexually Abused: Not on file     BP 124/70    Pulse 80    Ht 5' 7"  (1.702 m)    Wt (!) 352 lb (159.7 kg)    SpO2 97%    BMI 55.13 kg/m   Physical Exam:  Well appearing NAD HEENT: Unremarkable Neck:  No JVD, no thyromegally Lymphatics:  No adenopathy Back:  No CVA tenderness Lungs:  Clear with no wheezes HEART:  IRegular rate rhythm, no murmurs, no rubs, no clicks Abd:  soft, positive bowel sounds, no organomegally, no rebound, no guarding Ext:  2 plus pulses, no edema, no cyanosis, no clubbing Skin:  No rashes no nodules Neuro:  CN II through XII intact, motor grossly intact  EKG - atrial fib with a controlled VR   Assess/Plan: 1. Atrial fib - her VR is controlled today. Previously it has been uncontrolled. I discussed the treatment options in detail. Because she is not a great surgical candidate and with her HR improved with the initiation of metoprolol, I think medical therapy alone is the best treatment option. We did discuss AV node ablation and PPM but this is not without risk.    2. Obesity - she is encouraged to lose weight. 3. Coags - she is not thought to be a candidate for systemic anti-coagulation due to the cirrhosis 4. Diastolic heart failure - this is a major problem and difficult to treat but improved with metolazone. I encouraged her to avoid salty food.  Carleene Overlie Breland Trouten,MD

## 2020-08-05 NOTE — Patient Instructions (Addendum)
Medication Instructions:  Your physician recommends that you continue on your current medications as directed. Please refer to the Current Medication list given to you today.  Labwork: None ordered.  Testing/Procedures: None ordered.  Follow-Up: Your physician wants you to follow-up in: as needed with Dr. Lovena Le.    Any Other Special Instructions Will Be Listed Below (If Applicable).  If you need a refill on your cardiac medications before your next appointment, please call your pharmacy.

## 2020-08-11 ENCOUNTER — Other Ambulatory Visit: Payer: Self-pay | Admitting: Cardiology

## 2020-09-02 ENCOUNTER — Other Ambulatory Visit: Payer: Self-pay | Admitting: Cardiovascular Disease

## 2020-12-07 ENCOUNTER — Ambulatory Visit
Admission: RE | Admit: 2020-12-07 | Discharge: 2020-12-07 | Disposition: A | Discharge status: Home / Self Care | Payer: BC Managed Care – PPO | Source: Ambulatory Visit | Attending: Family Medicine | Admitting: Family Medicine

## 2020-12-07 ENCOUNTER — Other Ambulatory Visit: Payer: Self-pay | Admitting: Family Medicine

## 2020-12-07 DIAGNOSIS — M79652 Pain in left thigh: Secondary | ICD-10-CM

## 2021-01-10 ENCOUNTER — Encounter (HOSPITAL_COMMUNITY): Payer: Self-pay

## 2021-01-10 ENCOUNTER — Emergency Department (HOSPITAL_COMMUNITY): Payer: BC Managed Care – PPO

## 2021-01-10 ENCOUNTER — Inpatient Hospital Stay (HOSPITAL_COMMUNITY)
Admission: EM | Admit: 2021-01-10 | Discharge: 2021-01-22 | DRG: 308 | Disposition: A | Discharge status: Skilled Nursing Facility | Payer: BC Managed Care – PPO | Attending: Internal Medicine | Admitting: Internal Medicine

## 2021-01-10 DIAGNOSIS — E785 Hyperlipidemia, unspecified: Secondary | ICD-10-CM | POA: Diagnosis present

## 2021-01-10 DIAGNOSIS — J9601 Acute respiratory failure with hypoxia: Secondary | ICD-10-CM | POA: Diagnosis present

## 2021-01-10 DIAGNOSIS — G8929 Other chronic pain: Secondary | ICD-10-CM | POA: Diagnosis present

## 2021-01-10 DIAGNOSIS — Z8673 Personal history of transient ischemic attack (TIA), and cerebral infarction without residual deficits: Secondary | ICD-10-CM | POA: Diagnosis not present

## 2021-01-10 DIAGNOSIS — G4733 Obstructive sleep apnea (adult) (pediatric): Secondary | ICD-10-CM | POA: Diagnosis not present

## 2021-01-10 DIAGNOSIS — K7460 Unspecified cirrhosis of liver: Secondary | ICD-10-CM | POA: Diagnosis not present

## 2021-01-10 DIAGNOSIS — Z79899 Other long term (current) drug therapy: Secondary | ICD-10-CM | POA: Diagnosis not present

## 2021-01-10 DIAGNOSIS — Z7189 Other specified counseling: Secondary | ICD-10-CM | POA: Diagnosis not present

## 2021-01-10 DIAGNOSIS — Z96611 Presence of right artificial shoulder joint: Secondary | ICD-10-CM | POA: Diagnosis present

## 2021-01-10 DIAGNOSIS — I493 Ventricular premature depolarization: Secondary | ICD-10-CM | POA: Diagnosis present

## 2021-01-10 DIAGNOSIS — E1169 Type 2 diabetes mellitus with other specified complication: Secondary | ICD-10-CM | POA: Diagnosis present

## 2021-01-10 DIAGNOSIS — K7581 Nonalcoholic steatohepatitis (NASH): Secondary | ICD-10-CM | POA: Diagnosis present

## 2021-01-10 DIAGNOSIS — M797 Fibromyalgia: Secondary | ICD-10-CM | POA: Diagnosis present

## 2021-01-10 DIAGNOSIS — I5033 Acute on chronic diastolic (congestive) heart failure: Secondary | ICD-10-CM | POA: Diagnosis present

## 2021-01-10 DIAGNOSIS — N390 Urinary tract infection, site not specified: Secondary | ICD-10-CM | POA: Diagnosis present

## 2021-01-10 DIAGNOSIS — Z9119 Patient's noncompliance with other medical treatment and regimen: Secondary | ICD-10-CM

## 2021-01-10 DIAGNOSIS — Z6841 Body Mass Index (BMI) 40.0 and over, adult: Secondary | ICD-10-CM

## 2021-01-10 DIAGNOSIS — I4819 Other persistent atrial fibrillation: Principal | ICD-10-CM | POA: Diagnosis present

## 2021-01-10 DIAGNOSIS — G928 Other toxic encephalopathy: Secondary | ICD-10-CM | POA: Diagnosis present

## 2021-01-10 DIAGNOSIS — Z87891 Personal history of nicotine dependence: Secondary | ICD-10-CM

## 2021-01-10 DIAGNOSIS — R4182 Altered mental status, unspecified: Secondary | ICD-10-CM

## 2021-01-10 DIAGNOSIS — Z66 Do not resuscitate: Secondary | ICD-10-CM | POA: Diagnosis present

## 2021-01-10 DIAGNOSIS — Z20822 Contact with and (suspected) exposure to covid-19: Secondary | ICD-10-CM | POA: Diagnosis present

## 2021-01-10 DIAGNOSIS — E662 Morbid (severe) obesity with alveolar hypoventilation: Secondary | ICD-10-CM | POA: Diagnosis present

## 2021-01-10 DIAGNOSIS — Z9049 Acquired absence of other specified parts of digestive tract: Secondary | ICD-10-CM | POA: Diagnosis not present

## 2021-01-10 DIAGNOSIS — I4891 Unspecified atrial fibrillation: Secondary | ICD-10-CM

## 2021-01-10 DIAGNOSIS — G4710 Hypersomnia, unspecified: Secondary | ICD-10-CM | POA: Diagnosis present

## 2021-01-10 DIAGNOSIS — K7290 Hepatic failure, unspecified without coma: Secondary | ICD-10-CM | POA: Diagnosis present

## 2021-01-10 DIAGNOSIS — I110 Hypertensive heart disease with heart failure: Secondary | ICD-10-CM | POA: Diagnosis present

## 2021-01-10 DIAGNOSIS — J810 Acute pulmonary edema: Secondary | ICD-10-CM

## 2021-01-10 DIAGNOSIS — K766 Portal hypertension: Secondary | ICD-10-CM | POA: Diagnosis present

## 2021-01-10 DIAGNOSIS — Z91013 Allergy to seafood: Secondary | ICD-10-CM

## 2021-01-10 DIAGNOSIS — R319 Hematuria, unspecified: Secondary | ICD-10-CM

## 2021-01-10 DIAGNOSIS — Z7984 Long term (current) use of oral hypoglycemic drugs: Secondary | ICD-10-CM

## 2021-01-10 DIAGNOSIS — E876 Hypokalemia: Secondary | ICD-10-CM | POA: Diagnosis present

## 2021-01-10 DIAGNOSIS — Z8249 Family history of ischemic heart disease and other diseases of the circulatory system: Secondary | ICD-10-CM | POA: Diagnosis not present

## 2021-01-10 DIAGNOSIS — L308 Other specified dermatitis: Secondary | ICD-10-CM | POA: Diagnosis present

## 2021-01-10 DIAGNOSIS — Z789 Other specified health status: Secondary | ICD-10-CM | POA: Diagnosis not present

## 2021-01-10 DIAGNOSIS — D696 Thrombocytopenia, unspecified: Secondary | ICD-10-CM | POA: Diagnosis present

## 2021-01-10 DIAGNOSIS — Z515 Encounter for palliative care: Secondary | ICD-10-CM | POA: Diagnosis not present

## 2021-01-10 DIAGNOSIS — K7469 Other cirrhosis of liver: Secondary | ICD-10-CM | POA: Diagnosis present

## 2021-01-10 DIAGNOSIS — D6959 Other secondary thrombocytopenia: Secondary | ICD-10-CM | POA: Diagnosis present

## 2021-01-10 DIAGNOSIS — G9340 Encephalopathy, unspecified: Secondary | ICD-10-CM | POA: Diagnosis present

## 2021-01-10 DIAGNOSIS — J189 Pneumonia, unspecified organism: Secondary | ICD-10-CM

## 2021-01-10 DIAGNOSIS — E119 Type 2 diabetes mellitus without complications: Secondary | ICD-10-CM | POA: Diagnosis present

## 2021-01-10 DIAGNOSIS — I5032 Chronic diastolic (congestive) heart failure: Secondary | ICD-10-CM | POA: Diagnosis present

## 2021-01-10 LAB — GLUCOSE, CAPILLARY
Glucose-Capillary: 118 mg/dL — ABNORMAL HIGH (ref 70–99)
Glucose-Capillary: 133 mg/dL — ABNORMAL HIGH (ref 70–99)
Glucose-Capillary: 141 mg/dL — ABNORMAL HIGH (ref 70–99)
Glucose-Capillary: 148 mg/dL — ABNORMAL HIGH (ref 70–99)
Glucose-Capillary: 150 mg/dL — ABNORMAL HIGH (ref 70–99)

## 2021-01-10 LAB — I-STAT ARTERIAL BLOOD GAS, ED
Acid-Base Excess: 1 mmol/L (ref 0.0–2.0)
Bicarbonate: 26.5 mmol/L (ref 20.0–28.0)
Calcium, Ion: 1.22 mmol/L (ref 1.15–1.40)
HCT: 33 % — ABNORMAL LOW (ref 36.0–46.0)
Hemoglobin: 11.2 g/dL — ABNORMAL LOW (ref 12.0–15.0)
O2 Saturation: 91 %
Patient temperature: 98.6
Potassium: 4.2 mmol/L (ref 3.5–5.1)
Sodium: 141 mmol/L (ref 135–145)
TCO2: 28 mmol/L (ref 22–32)
pCO2 arterial: 44.4 mmHg (ref 32.0–48.0)
pH, Arterial: 7.385 (ref 7.350–7.450)
pO2, Arterial: 62 mmHg — ABNORMAL LOW (ref 83.0–108.0)

## 2021-01-10 LAB — RAPID URINE DRUG SCREEN, HOSP PERFORMED
Amphetamines: NOT DETECTED
Barbiturates: NOT DETECTED
Benzodiazepines: NOT DETECTED
Cocaine: NOT DETECTED
Opiates: NOT DETECTED
Tetrahydrocannabinol: NOT DETECTED

## 2021-01-10 LAB — I-STAT CHEM 8, ED
BUN: 18 mg/dL (ref 8–23)
Calcium, Ion: 1.04 mmol/L — ABNORMAL LOW (ref 1.15–1.40)
Chloride: 106 mmol/L (ref 98–111)
Creatinine, Ser: 0.7 mg/dL (ref 0.44–1.00)
Glucose, Bld: 171 mg/dL — ABNORMAL HIGH (ref 70–99)
HCT: 33 % — ABNORMAL LOW (ref 36.0–46.0)
Hemoglobin: 11.2 g/dL — ABNORMAL LOW (ref 12.0–15.0)
Potassium: 4.3 mmol/L (ref 3.5–5.1)
Sodium: 140 mmol/L (ref 135–145)
TCO2: 24 mmol/L (ref 22–32)

## 2021-01-10 LAB — CBC
HCT: 35.3 % — ABNORMAL LOW (ref 36.0–46.0)
Hemoglobin: 10.9 g/dL — ABNORMAL LOW (ref 12.0–15.0)
MCH: 28.1 pg (ref 26.0–34.0)
MCHC: 30.9 g/dL (ref 30.0–36.0)
MCV: 91 fL (ref 80.0–100.0)
RBC: 3.88 MIL/uL (ref 3.87–5.11)
RDW: 19.6 % — ABNORMAL HIGH (ref 11.5–15.5)
WBC: 6.4 10*3/uL (ref 4.0–10.5)
nRBC: 0 % (ref 0.0–0.2)

## 2021-01-10 LAB — COMPREHENSIVE METABOLIC PANEL
ALT: 16 U/L (ref 0–44)
ALT: 15 U/L (ref 0–44)
AST: 24 U/L (ref 15–41)
AST: 22 U/L (ref 15–41)
Albumin: 3 g/dL — ABNORMAL LOW (ref 3.5–5.0)
Albumin: 2.9 g/dL — ABNORMAL LOW (ref 3.5–5.0)
Alkaline Phosphatase: 65 U/L (ref 38–126)
Alkaline Phosphatase: 63 U/L (ref 38–126)
Anion gap: 10 (ref 5–15)
Anion gap: 10 (ref 5–15)
BUN: 17 mg/dL (ref 8–23)
BUN: 18 mg/dL (ref 8–23)
CO2: 24 mmol/L (ref 22–32)
CO2: 23 mmol/L (ref 22–32)
Calcium: 8.9 mg/dL (ref 8.9–10.3)
Calcium: 8.7 mg/dL — ABNORMAL LOW (ref 8.9–10.3)
Chloride: 104 mmol/L (ref 98–111)
Chloride: 104 mmol/L (ref 98–111)
Creatinine, Ser: 0.86 mg/dL (ref 0.44–1.00)
Creatinine, Ser: 0.8 mg/dL (ref 0.44–1.00)
GFR, Estimated: >60 mL/min (ref >60)
GFR, Estimated: >60 mL/min (ref >60)
Glucose, Bld: 169 mg/dL — ABNORMAL HIGH (ref 70–99)
Glucose, Bld: 206 mg/dL — ABNORMAL HIGH (ref 70–99)
Potassium: 4.4 mmol/L (ref 3.5–5.1)
Potassium: 4 mmol/L (ref 3.5–5.1)
Sodium: 138 mmol/L (ref 135–145)
Sodium: 137 mmol/L (ref 135–145)
Total Bilirubin: 6.4 mg/dL — ABNORMAL HIGH (ref 0.3–1.2)
Total Bilirubin: 6 mg/dL — ABNORMAL HIGH (ref 0.3–1.2)
Total Protein: 6.6 g/dL (ref 6.5–8.1)
Total Protein: 6.4 g/dL — ABNORMAL LOW (ref 6.5–8.1)

## 2021-01-10 LAB — CBC WITH DIFFERENTIAL/PLATELET
Abs Immature Granulocytes: 0.04 10*3/uL (ref 0.00–0.07)
Basophils Absolute: 0 10*3/uL (ref 0.0–0.1)
Basophils Relative: 0 %
Eosinophils Absolute: 0.1 10*3/uL (ref 0.0–0.5)
Eosinophils Relative: 1 %
HCT: 36.9 % (ref 36.0–46.0)
Hemoglobin: 11.4 g/dL — ABNORMAL LOW (ref 12.0–15.0)
Immature Granulocytes: 1 %
Lymphocytes Relative: 7 %
Lymphs Abs: 0.5 10*3/uL — ABNORMAL LOW (ref 0.7–4.0)
MCH: 28 pg (ref 26.0–34.0)
MCHC: 30.9 g/dL (ref 30.0–36.0)
MCV: 90.7 fL (ref 80.0–100.0)
Monocytes Absolute: 0.5 10*3/uL (ref 0.1–1.0)
Monocytes Relative: 7 %
Neutro Abs: 6.2 10*3/uL (ref 1.7–7.7)
Neutrophils Relative %: 84 %
Platelets: UNDETERMINED 10*3/uL (ref 150–400)
RBC: 4.07 MIL/uL (ref 3.87–5.11)
RDW: 19.5 % — ABNORMAL HIGH (ref 11.5–15.5)
WBC: 7.4 10*3/uL (ref 4.0–10.5)
nRBC: 0 % (ref 0.0–0.2)

## 2021-01-10 LAB — URINALYSIS, ROUTINE W REFLEX MICROSCOPIC
Bilirubin Urine: NEGATIVE
Glucose, UA: >=500 mg/dL — AB
Ketones, ur: NEGATIVE mg/dL
Nitrite: NEGATIVE
Protein, ur: 30 mg/dL — AB
Specific Gravity, Urine: 1.025 (ref 1.005–1.030)
pH: 5 (ref 5.0–8.0)

## 2021-01-10 LAB — AMMONIA: Ammonia: 78 umol/L — ABNORMAL HIGH (ref 9–35)

## 2021-01-10 LAB — MRSA PCR SCREENING: MRSA by PCR: NEGATIVE

## 2021-01-10 LAB — ACETAMINOPHEN LEVEL: Acetaminophen (Tylenol), Serum: <10 ug/mL — ABNORMAL LOW (ref 10–30)

## 2021-01-10 LAB — BRAIN NATRIURETIC PEPTIDE: B Natriuretic Peptide: 359.8 pg/mL — ABNORMAL HIGH (ref 0.0–100.0)

## 2021-01-10 LAB — SARS CORONAVIRUS 2 (TAT 6-24 HRS): SARS Coronavirus 2: NEGATIVE

## 2021-01-10 LAB — CBG MONITORING, ED: Glucose-Capillary: 189 mg/dL — ABNORMAL HIGH (ref 70–99)

## 2021-01-10 LAB — TROPONIN I (HIGH SENSITIVITY)
Troponin I (High Sensitivity): 9 ng/L (ref <18)
Troponin I (High Sensitivity): 9 ng/L (ref <18)

## 2021-01-10 LAB — TSH: TSH: 1.135 u[IU]/mL (ref 0.350–4.500)

## 2021-01-10 LAB — MAGNESIUM: Magnesium: 1.9 mg/dL (ref 1.7–2.4)

## 2021-01-10 LAB — PROTIME-INR
INR: 1.4 — ABNORMAL HIGH (ref 0.8–1.2)
Prothrombin Time: 17 seconds — ABNORMAL HIGH (ref 11.4–15.2)

## 2021-01-10 LAB — ETHANOL: Alcohol, Ethyl (B): <10 mg/dL (ref <10)

## 2021-01-10 LAB — SALICYLATE LEVEL: Salicylate Lvl: <7 mg/dL — ABNORMAL LOW (ref 7.0–30.0)

## 2021-01-10 MED ORDER — DILTIAZEM LOAD VIA INFUSION
10.0000 mg | Freq: Once | INTRAVENOUS | Status: AC
  Administered 2021-01-10: 10 mg via INTRAVENOUS
  Filled 2021-01-10: qty 10

## 2021-01-10 MED ORDER — SODIUM CHLORIDE 0.9 % IV SOLN
1.0000 g | Freq: Once | INTRAVENOUS | Status: AC
  Administered 2021-01-10: 1 g via INTRAVENOUS
  Filled 2021-01-10: qty 10

## 2021-01-10 MED ORDER — FUROSEMIDE 10 MG/ML IJ SOLN
40.0000 mg | Freq: Two times a day (BID) | INTRAMUSCULAR | Status: DC
  Administered 2021-01-10 – 2021-01-11 (×3): 40 mg via INTRAVENOUS
  Filled 2021-01-10 (×3): qty 4

## 2021-01-10 MED ORDER — DILTIAZEM HCL-DEXTROSE 125-5 MG/125ML-% IV SOLN (PREMIX)
5.0000 mg/h | INTRAVENOUS | Status: DC
  Administered 2021-01-10: 5 mg/h via INTRAVENOUS
  Administered 2021-01-10 – 2021-01-11 (×4): 15 mg/h via INTRAVENOUS
  Administered 2021-01-11: 10 mg/h via INTRAVENOUS
  Administered 2021-01-12: 5 mg/h via INTRAVENOUS
  Filled 2021-01-10 (×6): qty 125

## 2021-01-10 MED ORDER — CEFTRIAXONE SODIUM 1 G IJ SOLR
1.0000 g | INTRAMUSCULAR | Status: DC
  Administered 2021-01-10 – 2021-01-12 (×3): 1 g via INTRAVENOUS
  Filled 2021-01-10 (×3): qty 10

## 2021-01-10 MED ORDER — DILTIAZEM HCL ER COATED BEADS 180 MG PO CP24: 300.0000 mg | ORAL_CAPSULE | Freq: Every day | ORAL | Status: DC

## 2021-01-10 MED ORDER — ONDANSETRON HCL 4 MG/2ML IJ SOLN
4.0000 mg | Freq: Four times a day (QID) | INTRAMUSCULAR | Status: DC | PRN
  Administered 2021-01-12 (×2): 4 mg via INTRAVENOUS
  Filled 2021-01-10 (×2): qty 2

## 2021-01-10 MED ORDER — SENNOSIDES-DOCUSATE SODIUM 8.6-50 MG PO TABS
1.0000 | ORAL_TABLET | Freq: Every evening | ORAL | Status: DC | PRN
  Administered 2021-01-12: 1 via ORAL
  Filled 2021-01-10: qty 1

## 2021-01-10 MED ORDER — NALOXONE HCL 2 MG/2ML IJ SOSY
1.0000 mg | PREFILLED_SYRINGE | Freq: Once | INTRAMUSCULAR | Status: AC
  Administered 2021-01-10: 1 mg via INTRAVENOUS
  Filled 2021-01-10: qty 2

## 2021-01-10 MED ORDER — METOPROLOL TARTRATE 5 MG/5ML IV SOLN
5.0000 mg | INTRAVENOUS | Status: DC | PRN
  Administered 2021-01-10 – 2021-01-11 (×5): 5 mg via INTRAVENOUS
  Filled 2021-01-10 (×5): qty 5

## 2021-01-10 MED ORDER — METOPROLOL TARTRATE 50 MG PO TABS
50.0000 mg | ORAL_TABLET | Freq: Two times a day (BID) | ORAL | Status: DC
  Administered 2021-01-11 – 2021-01-16 (×11): 50 mg via ORAL
  Filled 2021-01-10 (×11): qty 1

## 2021-01-10 MED ORDER — INSULIN ASPART 100 UNIT/ML ~~LOC~~ SOLN
0.0000 [IU] | SUBCUTANEOUS | Status: DC
  Administered 2021-01-10: 2 [IU] via SUBCUTANEOUS
  Administered 2021-01-10 – 2021-01-12 (×7): 1 [IU] via SUBCUTANEOUS
  Administered 2021-01-12: 2 [IU] via SUBCUTANEOUS
  Administered 2021-01-13 (×2): 1 [IU] via SUBCUTANEOUS
  Administered 2021-01-13: 2 [IU] via SUBCUTANEOUS
  Administered 2021-01-13: 1 [IU] via SUBCUTANEOUS
  Administered 2021-01-13: 2 [IU] via SUBCUTANEOUS
  Administered 2021-01-13: 1 [IU] via SUBCUTANEOUS
  Administered 2021-01-14: 3 [IU] via SUBCUTANEOUS
  Administered 2021-01-14: 1 [IU] via SUBCUTANEOUS
  Administered 2021-01-14: 3 [IU] via SUBCUTANEOUS
  Administered 2021-01-14 – 2021-01-15 (×5): 1 [IU] via SUBCUTANEOUS
  Administered 2021-01-15: 2 [IU] via SUBCUTANEOUS
  Administered 2021-01-15 – 2021-01-17 (×8): 1 [IU] via SUBCUTANEOUS
  Administered 2021-01-17: 2 [IU] via SUBCUTANEOUS
  Administered 2021-01-17 (×3): 1 [IU] via SUBCUTANEOUS
  Administered 2021-01-17: 3 [IU] via SUBCUTANEOUS
  Administered 2021-01-18 – 2021-01-19 (×5): 1 [IU] via SUBCUTANEOUS
  Administered 2021-01-19: 3 [IU] via SUBCUTANEOUS
  Administered 2021-01-19 (×2): 1 [IU] via SUBCUTANEOUS
  Administered 2021-01-20: 2 [IU] via SUBCUTANEOUS
  Administered 2021-01-20 – 2021-01-21 (×7): 1 [IU] via SUBCUTANEOUS
  Administered 2021-01-21 (×2): 2 [IU] via SUBCUTANEOUS

## 2021-01-10 MED ORDER — IOHEXOL 350 MG/ML SOLN
100.0000 mL | Freq: Once | INTRAVENOUS | Status: AC | PRN
  Administered 2021-01-10: 100 mL via INTRAVENOUS

## 2021-01-10 MED ORDER — ONDANSETRON HCL 4 MG PO TABS
4.0000 mg | ORAL_TABLET | Freq: Four times a day (QID) | ORAL | Status: DC | PRN
  Administered 2021-01-16: 4 mg via ORAL
  Filled 2021-01-10: qty 1

## 2021-01-10 NOTE — ED Triage Notes (Signed)
PT arrives by EMS, ems reports husband called due to pt being AMS to self, EMS reports pt was in Afib RVR  With HR 180-190, ems states pt has an hx of Afib  And takes Cardizem but did not take it today. Pt does not take blood thinners due to low platelets. EMS Gave 30 mg of cardizem and 450 NS. Ems  Reports pt recently had right arm shoulder surgery about 2 weeks ago

## 2021-01-10 NOTE — Progress Notes (Signed)
Pt arrived to unit

## 2021-01-10 NOTE — H&P (Signed)
History and Physical    Hannah Khan IWL:[id number] DOB: July 28, 1956 DOA: 01/10/2021  PCP: Lujean Amel, MD   Patient coming from: Home   Chief Complaint: Altered mental status   HPI: Hannah Khan is a 65 y.o. female with medical history significant for cirrhosis with varices, chronic thrombocytopenia, atrial fibrillation not anticoagulated due to variceal bleed and low platelets, cirrhosis, type 2 diabetes mellitus, OSA, BMI 55, and right shoulder surgery on 12/30/2020, now presenting to the emergency department for evaluation of altered mental status.  Patient's husband is at the bedside assisting with the history.  Patient has been sleeping in a recliner the past several days until the night of 01/08/2021 when she slept in bed, continued to sleep all day, and was then noted to be slipping out of the bed last night, having difficulty waking up, and confused.  She has been taking oxycodone every 4-6 hours and Flexeril every 8 hours since the recent surgery, has been using her CPAP as directed, and had been taking her medications until yesterday when she was sleeping and did not take them.  Husband also notes that she has been incontinent of urine and with her appeared to be some specks of blood.  She had several episodes of vomiting a few nights ago but that resolved and she was not complaining of any abdominal pain or fever.  She continued to be somnolent and confused last night, EMS was called and found the patient to be in atrial fibrillation with heart rate 1 80-1 90.  She was given 450 mL of IV fluids and 30 mg Cardizem by EMS prior to arrival.  ED Course: Upon arrival to the ED, patient is found to be afebrile, saturating low to mid 90s on room air, tachycardic in the 130s, and hypertensive to 160/110.  EKG features atrial fibrillation with rate 119, PVCs, and LAFB.  Chest x-ray negative for acute cardiopulmonary disease.  Head CT negative for acute intracranial abnormality.  CTA chest is a  limited study and nondiagnostic for pulmonary embolism but notable for cardiac enlargement, small bilateral pleural effusions, and pulmonary edema. Troponin negative x2. Ethanol undetectable. She was treated with Rocephin, Narcan, and IV diltiazem in ED.   Review of Systems:  All other systems reviewed and apart from HPI, are negative.  Past Medical History:  Diagnosis Date  . Anemia    hx of  . Arthritis    knees  . Diabetes mellitus without complication ([place])    type 2  . Fibromyalgia   . HTN (hypertension)   . Hx of colonic polyps    s/p partial colectomy  . Hyperlipidemia   . Joint pain    in knees and back spasms  . Left leg swelling    wear compression hose  . Liver cirrhosis secondary to NASH (nonalcoholic steatohepatitis) ([place]) dx nov 2014   had enlarged spleen also   . Low blood pressure    bp varies  . Morbid obesity ([place])   . Persistent atrial fibrillation ([place])   . Pneumonia 10/2016  . PONV (postoperative nausea and vomiting)    in past none recent  . Portal hypertension ([place])   . Sleep apnea    uses cpap setting of 14  . Spleen enlarged   . 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 [facility])     Past Surgical History:  Procedure Laterality Date  . BREAST LUMPECTOMY WITH RADIOACTIVE SEED LOCALIZATION Right 08/29/2017   Procedure: RIGHT BREAST LUMPECTOMY WITH RADIOACTIVE SEEDS LOCALIZATION;  Surgeon: Erroll Luna, MD;  Location: [facility];  Service: General;  Laterality: Right;  . CESAREAN SECTION  1989  . CHOLECYSTECTOMY  20 yrs ago  . COLECTOMY  2011  . COLONOSCOPY    . COLONOSCOPY N/A 09/05/2018   Procedure: COLONOSCOPY;  Surgeon: [name], MD;  Location: [facility];  Service: Endoscopy;  Laterality: N/A;  . DILATATION & CURETTAGE/HYSTEROSCOPY WITH MYOSURE N/A 05/06/2016   Procedure: DILATATION & CURETTAGE/HYSTEROSCOPY WITH MYOSURE WITH POLYPECTOMY;  Surgeon: Janyth Pupa, DO;  Location: [facility];  Service: Gynecology;  Laterality: N/A;  .  ESOPHAGOGASTRODUODENOSCOPY (EGD) WITH PROPOFOL N/A 09/04/2013   Procedure: ESOPHAGOGASTRODUODENOSCOPY (EGD) WITH PROPOFOL;  Surgeon: [name], MD;  Location: [facility];  Service: Endoscopy;  Laterality: N/A;  . KNEE ARTHROSCOPY  yrs ago   bilateral, one done x 1, one done twice  . POLYPECTOMY  09/05/2018   Procedure: POLYPECTOMY;  Surgeon: [name], MD;  Location: [facility];  Service: Endoscopy;;    Social History:   reports that she quit smoking about 46 years ago. Her smoking use included cigarettes. She has a 3.00 pack-year smoking history. She has never used smokeless tobacco. She reports current alcohol use. She reports that she does not use drugs.  Allergies  Allergen Reactions  . Celecoxib Other (See Comments)    "speech slurred" with celebrex  . Shellfish Allergy Swelling and Rash    TINGLING AND SWELLING OF LIPS. LARGE WHELPS QUARTER-SIZE  . Barium-Containing Compounds Other (See Comments)    TACHYCARDIA  . Prednisone Other (See Comments)    TACHYCARDIA  . Statins Other (See Comments)    AGGRAVATED FIBROMYALGIA  . Fentanyl     Hallucinations   . Ibuprofen Other (See Comments)    UNSPECIFIED SPECIFIC REACTION >> "DUE TO PLATELETS"  . Tramadol     Hallucination   . Tylenol [Acetaminophen] Other (See Comments)    UNSPECIFIED SPECIFIC REACTION >> "DUE TO PLATELETS"    Family History  Problem Relation Age of Onset  . Heart failure Mother   . Cancer Mother   . Hyperlipidemia Mother   . Congestive Heart Failure Mother   . Stroke Mother   . Lung cancer Father   . Cancer Father   . Cancer Brother   . Hyperlipidemia Brother   . Stroke Other      Prior to Admission medications   Medication Sig Start Date End Date Taking? Authorizing Provider  albuterol (PROVENTIL HFA;VENTOLIN HFA) 108 (90 Base) MCG/ACT inhaler Inhale 2 puffs into the lungs every 6 (six) hours as needed for wheezing or shortness of breath.  11/01/18   [provider]   bumetanide (BUMEX) 2 MG tablet TAKE 1 TABLET (2 MG TOTAL) BY MOUTH 2 (TWO) TIMES DAILY. 08/12/20   Erlene Quan, PA-C  cyclobenzaprine (FLEXERIL) 10 MG tablet Take by mouth. 01/06/21   [provider]  diazepam (VALIUM) 2 MG tablet Take 2 mg by mouth 2 (two) times daily as needed. 11/29/20   [provider]  diltiazem (CARDIZEM CD) 300 MG 24 hr capsule Take 1 capsule (300 mg total) by mouth daily. 08/05/20   Evans Lance, MD  gabapentin (NEURONTIN) 300 MG capsule Take by mouth. 12/24/20   [provider]  JARDIANCE 25 MG TABS tablet Take 25 mg by mouth daily after breakfast.  01/28/17   [provider]  Lancets (ONETOUCH DELICA PLUS [id number]) MISC Apply topically 3 (three) times daily. 04/10/20   [provider]  metolazone (ZAROXOLYN) 5 MG tablet TAKE  1 EVERY OTHER DAY 1 HOUR PRIOR TO YOUR BUMEX FOR 3 DAYS ONLY OR AS DIRECTED 09/03/20   Skeet Latch, MD  metoprolol tartrate (LOPRESSOR) 50 MG tablet Take 1 tablet (50 mg total) by mouth 2 (two) times daily. 06/17/20   Skeet Latch, MD  mirabegron ER (MYRBETRIQ) 50 MG TB24 tablet Take 50 mg by mouth daily.    [provider]  ondansetron (ZOFRAN) 4 MG tablet Take 4 mg by mouth every 8 (eight) hours as needed. 12/31/20   [provider]  [facility] VERIO test strip 1 each 3 (three) times daily. 04/13/20   [provider]  oxyCODONE (OXY IR/ROXICODONE) 5 MG immediate release tablet Take by mouth. 01/09/21   [provider]  potassium chloride (KLOR-CON SPRINKLE) 10 MEQ CR capsule TAKE 4 DAILY 06/17/20   Skeet Latch, MD  spironolactone (ALDACTONE) 25 MG tablet Take 1 tablet (25 mg total) by mouth daily. Schedule ov for further refills. 06/17/20   Skeet Latch, MD    Physical Exam: Vitals:   01/10/21 0015 01/10/21 0215 01/10/21 0315 01/10/21 0400  BP: 129/68 (!) 140/124 (!) 155/117 (!) 144/102  Pulse: (!) 109 (!) 116 (!) 128 (!) 120  Resp: 19 19 (!) 22 16   Temp: 98.1 F (36.7 C)     TempSrc: Oral     SpO2: 98% 97% 96% 90%    Constitutional: NAD, no diaphoresis   Eyes: PERTLA, lids and conjunctivae normal ENMT: Mucous membranes are moist. Posterior pharynx clear of any exudate or lesions.   Neck: supple, no masses  Respiratory: clear to auscultation bilaterally, no wheezing, no crackles. No accessory muscle use.  Cardiovascular: Rate ~120 and irregularly irregular. Pretibial pitting edema bilaterally.  Abdomen: No distension, no tenderness, soft. Bowel sounds active.  Musculoskeletal: no clubbing / cyanosis. No joint deformity upper and lower extremities.   Skin: Hyperpigmentation involving b/l lower legs in gaiter distribution. Warm, dry, well-perfused. Neurologic: CN 2-12 grossly intact. Sensation intact. Moving all extremities.  Psychiatric: Somnolent. Oriented to person only.    Labs and Imaging on Admission: I have personally reviewed following labs and imaging studies  CBC: Recent Labs  Lab 01/10/21 0017 01/10/21 0046 01/10/21 0120  WBC 7.4  --   --   NEUTROABS 6.2  --   --   HGB 11.4* 11.2* 11.2*  HCT 36.9 33.0* 33.0*  MCV 90.7  --   --   PLT PLATELET CLUMPS NOTED ON SMEAR, UNABLE TO ESTIMATE  --   --    Basic Metabolic Panel: Recent Labs  Lab 01/10/21 0017 01/10/21 0046 01/10/21 0120  NA 138 141 140  K 4.4 4.2 4.3  CL 104  --  106  CO2 24  --   --   GLUCOSE 169*  --  171*  BUN 17  --  18  CREATININE 0.86  --  0.70  CALCIUM 8.9  --   --    GFR: CrCl cannot be calculated (Unknown ideal weight.). Liver Function Tests: Recent Labs  Lab 01/10/21 0017  AST 24  ALT 16  ALKPHOS 65  BILITOT 6.4*  PROT 6.6  ALBUMIN 3.0*   No results for input(s): LIPASE, AMYLASE in the last 168 hours. No results for input(s): AMMONIA in the last 168 hours. Coagulation Profile: Recent Labs  Lab 01/10/21 0017  INR 1.4*   Cardiac Enzymes: No results for input(s): CKTOTAL, CKMB, CKMBINDEX, TROPONINI in the last 168  hours. BNP (last 3 results) No results for input(s): PROBNP in the  last 8760 hours. HbA1C: No results for input(s): HGBA1C in the last 72 hours. CBG: No results for input(s): GLUCAP in the last 168 hours. Lipid Profile: No results for input(s): CHOL, HDL, LDLCALC, TRIG, CHOLHDL, LDLDIRECT in the last 72 hours. Thyroid Function Tests: No results for input(s): TSH, T4TOTAL, FREET4, T3FREE, THYROIDAB in the last 72 hours. Anemia Panel: No results for input(s): VITAMINB12, FOLATE, FERRITIN, TIBC, IRON, RETICCTPCT in the last 72 hours. Urine analysis:    Component Value Date/Time   COLORURINE AMBER (A) 01/10/2021 0218   APPEARANCEUR CLOUDY (A) 01/10/2021 0218   LABSPEC 1.025 01/10/2021 0218   PHURINE 5.0 01/10/2021 0218   GLUCOSEU >=500 (A) 01/10/2021 0218   HGBUR MODERATE (A) 01/10/2021 0218   BILIRUBINUR NEGATIVE 01/10/2021 0218   KETONESUR NEGATIVE 01/10/2021 0218   PROTEINUR 30 (A) 01/10/2021 0218   UROBILINOGEN 2.0 (H) 01/31/2015 0055   NITRITE NEGATIVE 01/10/2021 0218   LEUKOCYTESUR MODERATE (A) 01/10/2021 0218   Sepsis Labs: @LABRCNTIP (procalcitonin:4,lacticidven:4) )No results found for this or any previous visit (from the past 240 hour(s)).   Radiological Exams on Admission: CT Head Wo Contrast  Result Date: 01/10/2021 CLINICAL DATA:  Acute neurological deficit. EXAM: CT HEAD WITHOUT CONTRAST TECHNIQUE: Contiguous axial images were obtained from the base of the skull through the vertex without intravenous contrast. COMPARISON:  03/20/2018 FINDINGS: Brain: No evidence of acute infarction, hemorrhage, hydrocephalus, extra-axial collection or mass lesion/mass effect. Vascular: Moderate intracranial arterial calcifications. Skull: Calvarium appears intact. Sinuses/Orbits: Paranasal sinuses and mastoid air cells are clear. Other: None. IMPRESSION: No acute intracranial abnormalities. Electronically Signed   By: Lucienne Capers M.D.   On: 01/10/2021 02:19   CT Angio Chest PE W  and/or Wo Contrast  Result Date: 01/10/2021 CLINICAL DATA:  Chest pain and shortness of breath. Altered mental status. Atrial fibrillation. EXAM: CT ANGIOGRAPHY CHEST WITH CONTRAST TECHNIQUE: Multidetector CT imaging of the chest was performed using the standard protocol during bolus administration of intravenous contrast. Multiplanar CT image reconstructions and MIPs were obtained to evaluate the vascular anatomy. CONTRAST:  156m OMNIPAQUE IOHEXOL 350 MG/ML SOLN COMPARISON:  03/15/2020 FINDINGS: Cardiovascular: Examination is technically limited due to body habitus and poor contrast bolus. Due to technical factors, the examination is nondiagnostic for evaluation of pulmonary embolus. Diffuse cardiac enlargement. Normal caliber thoracic aorta. Great vessel origins are patent. Aberrant right subclavian artery. Mediastinum/Nodes: Esophagus is decompressed. No significant lymphadenopathy in the chest. Lungs/Pleura: Small bilateral pleural effusions with basilar atelectasis. Patchy perihilar infiltrates likely due to edema. Upper Abdomen: Upper abdominal contents suggest cirrhotic configuration of the liver with splenic enlargement. Prominent varices are suggested. Musculoskeletal: Degenerative changes in the spine. Review of the MIP images confirms the above findings. IMPRESSION: 1. Technically limited study due to body habitus and poor contrast bolus. Due to technical factors, the examination is nondiagnostic for evaluation of pulmonary embolus. 2. Diffuse cardiac enlargement with small bilateral pleural effusions and perihilar infiltrates likely due to edema. 3. Aberrant right subclavian artery. 4. Probable cirrhotic configuration of the liver with splenic enlargement and varices. 5. Aortic atherosclerosis. Aortic Atherosclerosis (ICD10-I70.0). Electronically Signed   By: WLucienne CapersM.D.   On: 01/10/2021 02:24   DG Chest Portable 1 View  Result Date: 01/10/2021 CLINICAL DATA:  Atrial fibrillation. EXAM:  PORTABLE CHEST 1 VIEW COMPARISON:  03/16/2020 FINDINGS: Shallow inspiration. Cardiac enlargement with vascular congestion and perihilar infiltration, likely due to edema. Small bilateral pleural effusions. No pneumothorax. IMPRESSION: No active disease. Electronically Signed   By: WOren BeckmannD.  On: 01/10/2021 00:56    EKG: Independently reviewed. Atrial fibrillation, rate 119, PVCs, LAFB.   Assessment/Plan   1. Acute encephalopathy  - Presents with somnolence and confusion  - No acute findings on head CT; not hypercarbic on ABG  - Plan to hold oxycodone and Flexeril, treat UTI, check ammonia    2. Atrial fibrillation with RVR  - Presents with somnolence and confusion, was in a fib with rate 180-190 with EMS and given 30 mg diltiazem prior to arrival  - Rate 130s despite additional IV diltiazem in ED, will use IV Lopressor as needed  - CHADS-VASc at least 3 (gender, CHF, DM) but not anticoagulated d/t hx of variceal bleeding and thrombocytopenia   3. Acute on chronic diastolic CHF  - She has mild hypoxia on admission and pulmonary edema on imaging  - EF was preserved in June 2021  - Diurese with Lasix 40 mg IV q12h, monitor weight and I/Os, control rate, monitor renal function and electrolytes   4. UTI  - Presents with encephalopathy, has recently developed hematuria, and has UA compatible with possible infection  - Not septic on admission  - Culture urine, continue Rocephin    5. Cirrhosis  - Attributed to NASH with hx of variceal bleeding  - Check ammonia    6. Thrombocytopenia  - Platelet count pending, chronically-low, likely d/t cirrhosis and splenomegaly  - No apparent bleeding    7. Acute hypoxic respiratory failure; OSA  - Diurese as above, continue CPAP qHS, supplemental O2 as needed     DVT prophylaxis: SCDs  Code Status: Full  Level of Care: Level of care: Progressive Family Communication: Husband updated at bedside Disposition Plan:  Patient is from:  Home  Anticipated d/c is to: TBD Anticipated d/c date is: 01/14/21 Patient currently: Pending rate-control, improvement in mental and respiratory status  Consults called: none  Admission status: inpatient     Vianne Bulls, MD [facility]  01/10/2021, 4:24 AM

## 2021-01-10 NOTE — ED Provider Notes (Signed)
[facility] Provider Note   CSN: [id number] Arrival date & time: 01/10/21  0008     History Chief Complaint  Patient presents with  . Altered Mental Status  . Atrial Fibrillation    Hannah Khan is a 65 y.o. female.  The history is provided by the EMS personnel. The history is limited by the condition of the patient.  Altered Mental Status Presenting symptoms: confusion and disorientation   Severity:  Moderate Most recent episode:  Today Episode history:  Single Timing:  Constant Progression:  Unchanged Chronicity:  New Context: recent illness   Context: not dementia and not nursing home resident   Context comment:  S/p shoulder surgery on narcotic pain medication  Associated symptoms: palpitations   Associated symptoms: no fever, no rash and no vomiting   Associated symptoms comment:  AFIB with RVR  Patient with AFIB who did not take her cardizem and is not on blood thinners presents with HR 180-190s and AMS.  Patient is 1 weeks sp shoulder surgery on narcotic pain medication.       Past Medical History:  Diagnosis Date  . Anemia    hx of  . Arthritis    knees  . Diabetes mellitus without complication ([place])    type 2  . Fibromyalgia   . HTN (hypertension)   . Hx of colonic polyps    s/p partial colectomy  . Hyperlipidemia   . Joint pain    in knees and back spasms  . Left leg swelling    wear compression hose  . Liver cirrhosis secondary to NASH (nonalcoholic steatohepatitis) ([place]) dx nov 2014   had enlarged spleen also   . Low blood pressure    bp varies  . Morbid obesity ([place])   . Persistent atrial fibrillation ([place])   . Pneumonia 10/2016  . PONV (postoperative nausea and vomiting)    in past none recent  . Portal hypertension ([place])   . Sleep apnea    uses cpap setting of 14  . Spleen enlarged   . 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 [facility])     Patient Active Problem List   Diagnosis Date Noted  .  Secondary hypercoagulable state ([place]) 06/23/2020  . Streptococcal bacteremia 03/16/2020  . Chronic venous stasis dermatitis of both lower extremities 03/16/2020  . A-fib ([place]) 03/15/2020  . UTI (urinary tract infection) 04/26/2018  . Liver cirrhosis secondary to NASH (nonalcoholic steatohepatitis) ([place]) 04/26/2018  . Hyperlipidemia 04/26/2018  . Type 2 diabetes mellitus with other specified complication ([place])   . Cellulitis of right lower extremity 03/15/2018  . Severe sepsis ([place]) 03/14/2018  . Community acquired pneumonia of right lower lobe of lung 10/31/2016  . Muscular abdominal pain in right upper quadrant 07/11/2016  . Chronic diastolic CHF (congestive heart failure) ([place]) 07/10/2016  . Thrombocytopenia ([place]) 07/10/2016  . Permanent atrial fibrillation ([place]) 01/30/2016  . Type 2 diabetes mellitus without complication, without long-term current use of insulin ([place]) 01/30/2016  . Acute on chronic combined systolic and diastolic CHF (congestive heart failure) ([place]) 01/26/2016  . Morbid obesity- 12/10/2015  . Persistent atrial fibrillation ([place]) 12/08/2015  . Melena 01/31/2015  . Aortic heart murmur 01/31/2015  . Pancytopenia ([place]) 07/23/2013  . Obstructive sleep apnea 08/07/2007  . Essential hypertension 08/07/2007  . ALLERGIC RHINITIS 08/07/2007    Past Surgical History:  Procedure Laterality Date  . BREAST LUMPECTOMY WITH RADIOACTIVE SEED LOCALIZATION Right 08/29/2017   Procedure: RIGHT BREAST LUMPECTOMY WITH RADIOACTIVE SEEDS LOCALIZATION;  Surgeon: Erroll Luna, MD;  Location: [facility];  Service: General;  Laterality: Right;  . CESAREAN SECTION  1989  . CHOLECYSTECTOMY  20 yrs ago  . COLECTOMY  2011  . COLONOSCOPY    . COLONOSCOPY N/A 09/05/2018   Procedure: COLONOSCOPY;  Surgeon: [name], MD;  Location: [facility];  Service: Endoscopy;  Laterality: N/A;  . DILATATION & CURETTAGE/HYSTEROSCOPY WITH MYOSURE N/A 05/06/2016   Procedure: DILATATION &  CURETTAGE/HYSTEROSCOPY WITH MYOSURE WITH POLYPECTOMY;  Surgeon: Janyth Pupa, DO;  Location: [facility];  Service: Gynecology;  Laterality: N/A;  . ESOPHAGOGASTRODUODENOSCOPY (EGD) WITH PROPOFOL N/A 09/04/2013   Procedure: ESOPHAGOGASTRODUODENOSCOPY (EGD) WITH PROPOFOL;  Surgeon: [name], MD;  Location: [facility];  Service: Endoscopy;  Laterality: N/A;  . KNEE ARTHROSCOPY  yrs ago   bilateral, one done x 1, one done twice  . POLYPECTOMY  09/05/2018   Procedure: POLYPECTOMY;  Surgeon: [name], MD;  Location: [facility];  Service: Endoscopy;;     OB History   No obstetric history on file.     Family History  Problem Relation Age of Onset  . Heart failure Mother   . Cancer Mother   . Hyperlipidemia Mother   . Congestive Heart Failure Mother   . Stroke Mother   . Lung cancer Father   . Cancer Father   . Cancer Brother   . Hyperlipidemia Brother   . Stroke Other     Social History   Tobacco Use  . Smoking status: Former Smoker    Packs/day: 1.00    Years: 3.00    Pack years: 3.00    Types: Cigarettes    Quit date: 09/19/1974    Years since quitting: 46.3  . Smokeless tobacco: Never Used  Vaping Use  . Vaping Use: Never used  Substance Use Topics  . Alcohol use: Yes    Comment: occ  . Drug use: No    Home Medications Prior to Admission medications   Medication Sig Start Date End Date Taking? Authorizing Provider  albuterol (PROVENTIL HFA;VENTOLIN HFA) 108 (90 Base) MCG/ACT inhaler Inhale 2 puffs into the lungs every 6 (six) hours as needed for wheezing or shortness of breath.  11/01/18   [provider]  bumetanide (BUMEX) 2 MG tablet TAKE 1 TABLET (2 MG TOTAL) BY MOUTH 2 (TWO) TIMES DAILY. 08/12/20   Erlene Quan, PA-C  diltiazem (CARDIZEM CD) 300 MG 24 hr capsule Take 1 capsule (300 mg total) by mouth daily. 08/05/20   Evans Lance, MD  JARDIANCE 25 MG TABS tablet Take 25 mg by mouth daily after breakfast.  01/28/17   [provider]  Lancets [facility] DELICA PLUS [id number]) MISC Apply topically 3 (three) times daily. 04/10/20   [provider]  metolazone (ZAROXOLYN) 5 MG tablet TAKE 1 EVERY OTHER DAY 1 HOUR PRIOR TO YOUR BUMEX FOR 3 DAYS ONLY OR AS DIRECTED 09/03/20   Skeet Latch, MD  metoprolol tartrate (LOPRESSOR) 50 MG tablet Take 1 tablet (50 mg total) by mouth 2 (two) times daily. 06/17/20   Skeet Latch, MD  mirabegron ER (MYRBETRIQ) 50 MG TB24 tablet Take 50 mg by mouth daily.    [provider]  [facility] VERIO test strip 1 each 3 (three) times daily. 04/13/20   [provider]  potassium chloride (KLOR-CON SPRINKLE) 10 MEQ CR capsule TAKE 4 DAILY 06/17/20   Skeet Latch, MD  spironolactone (ALDACTONE) 25 MG tablet Take 1 tablet (25 mg total) by mouth daily. Schedule ov for  further refills. 06/17/20   Skeet Latch, MD    Allergies    Celecoxib, Shellfish allergy, Barium-containing compounds, Prednisone, Statins, Fentanyl, Ibuprofen, Tramadol, and Tylenol [acetaminophen]  Review of Systems   Review of Systems  Unable to perform ROS: Mental status change  Constitutional: Negative for fever.  Cardiovascular: Positive for palpitations.  Gastrointestinal: Negative for vomiting.  Skin: Negative for rash.  Neurological: Negative for facial asymmetry.  Psychiatric/Behavioral: Positive for confusion.    Physical Exam Updated Vital Signs BP 129/68   Pulse (!) 109   Temp 98.1 F (36.7 C) (Oral)   Resp 19   SpO2 98%   Physical Exam Vitals and nursing note reviewed.  Constitutional:      Appearance: Normal appearance. She is not diaphoretic.  HENT:     Head: Normocephalic and atraumatic.     Nose: Nose normal.  Eyes:     Conjunctiva/sclera: Conjunctivae normal.     Pupils: Pupils are equal, round, and reactive to light.  Cardiovascular:     Rate and Rhythm: Tachycardia present. Rhythm irregular.     Pulses: Normal pulses.     Heart sounds: Normal heart  sounds.  Pulmonary:     Effort: Pulmonary effort is normal.     Breath sounds: Normal breath sounds.  Abdominal:     General: Abdomen is flat. Bowel sounds are normal.     Palpations: Abdomen is soft.     Tenderness: There is no abdominal tenderness. There is no guarding.  Musculoskeletal:     Cervical back: Normal range of motion and neck supple.     Right lower leg: No edema.     Left lower leg: No edema.     Comments: Incision on the right shoulder is CDI   Skin:    General: Skin is warm and dry.     Capillary Refill: Capillary refill takes less than 2 seconds.  Neurological:     Mental Status: She is alert.     Deep Tendon Reflexes: Reflexes normal.     ED Results / Procedures / Treatments   Labs (all labs ordered are listed, but only abnormal results are displayed) Results for orders placed or performed during the hospital encounter of 01/10/21  I-stat chem 8, ED (not at [facility] or [facility])  Result Value Ref Range   Sodium 140 135 - 145 mmol/L   Potassium 4.3 3.5 - 5.1 mmol/L   Chloride 106 98 - 111 mmol/L   BUN 18 8 - 23 mg/dL   Creatinine, Ser 0.70 0.44 - 1.00 mg/dL   Glucose, Bld 171 (H) 70 - 99 mg/dL   Calcium, Ion 1.04 (L) 1.15 - 1.40 mmol/L   TCO2 24 22 - 32 mmol/L   Hemoglobin 11.2 (L) 12.0 - 15.0 g/dL   HCT 33.0 (L) 36.0 - 46.0 %  I-Stat arterial blood gas, ED  Result Value Ref Range   pH, Arterial 7.385 7.350 - 7.450   pCO2 arterial 44.4 32.0 - 48.0 mmHg   pO2, Arterial 62 (L) 83.0 - 108.0 mmHg   Bicarbonate 26.5 20.0 - 28.0 mmol/L   TCO2 28 22 - 32 mmol/L   O2 Saturation 91.0 %   Acid-Base Excess 1.0 0.0 - 2.0 mmol/L   Sodium 141 135 - 145 mmol/L   Potassium 4.2 3.5 - 5.1 mmol/L   Calcium, Ion 1.22 1.15 - 1.40 mmol/L   HCT 33.0 (L) 36.0 - 46.0 %   Hemoglobin 11.2 (L) 12.0 - 15.0 g/dL   Patient temperature 98.6  F    Collection site Radial    Drawn by RT    Sample type ARTERIAL    DG Chest Portable 1 View  Result Date: 01/10/2021 CLINICAL DATA:  Atrial  fibrillation. EXAM: PORTABLE CHEST 1 VIEW COMPARISON:  03/16/2020 FINDINGS: Shallow inspiration. Cardiac enlargement with vascular congestion and perihilar infiltration, likely due to edema. Small bilateral pleural effusions. No pneumothorax. IMPRESSION: No active disease. Electronically Signed   By: Lucienne Capers M.D.   On: 01/10/2021 00:56    EKG EKG Interpretation  Date/Time:  Sunday Lorry Anastasi 24 2022 00:13:51 EDT Ventricular Rate:  119 PR Interval:    QRS Duration: 95 QT Interval:  318 QTC Calculation: 423 R Axis:   -68 Text Interpretation: Atrial fibrillation Ventricular bigeminy Left anterior fascicular block Low voltage, extremity and precordial leads Confirmed by Randal Buba, Bannie Lobban ([id number]) on 01/10/2021 12:16:18 AM   Radiology DG Chest Portable 1 View  Result Date: 01/10/2021 CLINICAL DATA:  Atrial fibrillation. EXAM: PORTABLE CHEST 1 VIEW COMPARISON:  03/16/2020 FINDINGS: Shallow inspiration. Cardiac enlargement with vascular congestion and perihilar infiltration, likely due to edema. Small bilateral pleural effusions. No pneumothorax. IMPRESSION: No active disease. Electronically Signed   By: Lucienne Capers M.D.   On: 01/10/2021 00:56    Procedures Procedures   Medications Ordered in ED Medications  diltiazem (CARDIZEM) 1 mg/mL load via infusion 10 mg (10 mg Intravenous Bolus from Bag 01/10/21 0043)    And  diltiazem (CARDIZEM) 125 mg in dextrose 5% 125 mL (1 mg/mL) infusion (10 mg/hr Intravenous Rate/Dose Change 01/10/21 0220)  cefTRIAXone (ROCEPHIN) 1 g in sodium chloride 0.9 % 100 mL IVPB (has no administration in time range)  naloxone [facility] injection 1 mg (1 mg Intravenous Given 01/10/21 0201)  iohexol (OMNIPAQUE) 350 MG/ML injection 100 mL (100 mLs Intravenous Contrast Given 01/10/21 0159)    ED Course  I have reviewed the triage vital signs and the nursing notes.  Pertinent labs & imaging results that were available during my care of the patient were reviewed by me  and considered in my medical decision making (see chart for details).    MDM Reviewed: previous chart, nursing note and vitals Interpretation: labs, ECG and x-ray (NACPD normal electrolytes ) Total time providing critical care: 75-105 minutes (diltiazem drip ). This excludes time spent performing separately reportable procedures and services. Consults: admitting MD  CRITICAL CARE Performed by: Alva Broxson K Earnesteen Birnie-Rasch Total critical care time: 75  minutes Critical care time was exclusive of separately billable procedures and treating other patients. Critical care was necessary to treat or prevent imminent or life-threatening deterioration. Critical care was time spent personally by me on the following activities: development of treatment plan with patient and/or surrogate as well as nursing, discussions with consultants, evaluation of patient's response to treatment, examination of patient, obtaining history from patient or surrogate, ordering and performing treatments and interventions, ordering and review of laboratory studies, ordering and review of radiographic studies, pulse oximetry and re-evaluation of patient's condition.  Final Clinical Impression(s) / ED Diagnoses Final diagnoses:  Altered mental status, unspecified altered mental status type  Atrial fibrillation with rapid ventricular response ([place])   Admit to medicine.  Some improvement with Carolin [place], Tyller Bowlby, MD 01/10/21 1761

## 2021-01-10 NOTE — Progress Notes (Signed)
PROGRESS NOTE    Hannah Khan  UYQ:[id number] DOB: [birth year] DOA: 01/10/2021 PCP: Lujean Amel, MD   Brief Narrative:  Hannah Khan is a 65 y.o. female with medical history significant for cirrhosis with varices, chronic thrombocytopenia, atrial fibrillation not anticoagulated due to variceal bleed and low platelets, cirrhosis, type 2 diabetes mellitus, OSA, BMI 55, and right shoulder surgery on 12/30/2020, now presenting to the emergency department for evaluation of altered mental status and increased somnolence. She has been taking oxycodone every 4-6 hours and Flexeril every 8 hours since the recent surgery, has been using her CPAP as directed, and had been taking her medications until just prior to admission due to somnolence.  Patient had transient episode of hematuria as well as nausea and vomiting that appeared to resolve before admission.  In the ED patient remains somnolent, noted to be in rapid A. fib and admitted to the hospital service.  Assessment & Plan:   Principal Problem:   Atrial fibrillation with RVR (HCC) Active Problems:   Obstructive sleep apnea   Acute on chronic diastolic CHF (congestive heart failure) (HCC)   Thrombocytopenia (HCC)   Acute respiratory failure with hypoxia (HCC)   Type 2 diabetes mellitus with other specified complication (HCC)   Acute lower UTI   Liver cirrhosis secondary to NASH (nonalcoholic steatohepatitis) (HCC)   Acute encephalopathy   Acute metabolic encephalopathy Likely in the setting of infection as below Rule out polypharmacy  - Presents with somnolence and confusion  - No acute findings on head CT; not hypercarbic on ABG  - Plan to hold oxycodone and Flexeril, treat UTI, check ammonia - Ammonia minimally elevated at 78 - UA concerning for UTI, patient unable to cooperate symptoms but hematuria noted at intake -continue ceftriaxone, patient does not meet sepsis criteria  Atrial fibrillation with RVR, improving  - Remains  tachycardic on IV diltiazem drip, continue to increase p.o. home medications as tolerated - Concern for provoked A. fib in the setting of infection and likely medication noncompliance given her somnolence she was unable to take p.o. at home and was giving medications per report - CHADS-VASc 5 -remains off anticoagulation in the setting of variceal bleeding and thrombocytopenia  Acute hypoxic respiratory failure  Likely in the setting of obesity hypoventilation, OSA and polypharmacy/infection Unlikely acute on chronic diastolic CHF  - Scant pulmonary edema on imaging, continue Lasix in the interim - Patient has known sleep apnea, holding CPAP in the setting of nausea vomiting and mental status changes at intake, high risk for aspiration -Continue to wean oxygen as tolerated, at baseline patient is not on oxygen -CTA chest has poor timing for contrast bolus and nondiagnostic but no clear indication for infection or pneumonia  Chronic cirrhosis  - Attributed to NASH with hx of variceal bleeding  -Ammonia 78,  Thrombocytopenia, chronic  - Platelet count pending, chronically-low, likely d/t cirrhosis and splenomegaly  - Likely acute on chronic at this point due to recent surgery and current illness - Attempting to recollect labs, platelets clumping overnight  Recent shoulder surgery -Pain appears to be well controlled -With Dr. Berenice Primas in [place], continue bandage changes as indicated.  DVT prophylaxis: SCDs only, given thrombocytopenia and history of bleeding Code Status: Full Family Communication: None present  Status is: Inpatient  Dispo: The patient is from: Home              Anticipated d/c is to: To be determined  Anticipated d/c date is: 48 to 72 hours              Patient currently not medically stable for discharge  Consultants:   None  Procedures:   None  Antimicrobials:  Ceftriaxone x3 days  Subjective: No acute issues or events overnight,  patient somewhat less somnolent over the past few hours but not yet back to baseline.  Review of systems markedly limited but patient denies nausea vomiting pain headaches fevers or chills.  Objective: Vitals:   01/10/21 0215 01/10/21 0315 01/10/21 0400 01/10/21 0430  BP: (!) 140/124 (!) 155/117 (!) 144/102 (!) 144/107  Pulse: (!) 116 (!) 128 (!) 120 (!) 114  Resp: 19 (!) 22 16 19   Temp:      TempSrc:      SpO2: 97% 96% 90% 93%    Intake/Output Summary (Last 24 hours) at 01/10/2021 0721 Last data filed at 01/10/2021 0700 Gross per 24 hour  Intake 100 ml  Output 1200 ml  Net -1100 ml   There were no vitals filed for this visit.  Examination:  General:  Pleasantly resting in bed, No acute distress.  Somnolent but easily arousable, alert to person and place only, could not confirm date month year president. HEENT:  Normocephalic atraumatic.  Sclerae nonicteric, noninjected.  Extraocular movements intact bilaterally. Neck:  Without mass or deformity.  Trachea is midline. Lungs:  Clear to auscultate bilaterally without rhonchi, wheeze, or rales. Heart: Irregularly irregular rate between 101 20.  Without murmurs, rubs, or gallops. Abdomen:  Soft, morbidly obese, nontender, nondistended.  Without guarding or rebound. Extremities: Without cyanosis, clubbing, edema, or obvious deformity. Vascular:  Dorsalis pedis and posterior tibial pulses palpable bilaterally. Skin:  Warm and dry, no erythema  Data Reviewed: I have personally reviewed following labs and imaging studies  CBC: Recent Labs  Lab 01/10/21 0017 01/10/21 0046 01/10/21 0120 01/10/21 0430  WBC 7.4  --   --  6.4  NEUTROABS 6.2  --   --   --   HGB 11.4* 11.2* 11.2* 10.9*  HCT 36.9 33.0* 33.0* 35.3*  MCV 90.7  --   --  91.0  PLT PLATELET CLUMPS NOTED ON SMEAR, UNABLE TO ESTIMATE  --   --  PLATELET CLUMPING, SUGGEST RECOLLECTION OF SAMPLE IN CITRATE TUBE.   Basic Metabolic Panel: Recent Labs  Lab 01/10/21 0017  01/10/21 0046 01/10/21 0120 01/10/21 0430  NA 138 141 140 137  K 4.4 4.2 4.3 4.0  CL 104  --  106 104  CO2 24  --   --  23  GLUCOSE 169*  --  171* 206*  BUN 17  --  18 18  CREATININE 0.86  --  0.70 0.80  CALCIUM 8.9  --   --  8.7*  MG  --   --   --  1.9   GFR: CrCl cannot be calculated (Unknown ideal weight.). Liver Function Tests: Recent Labs  Lab 01/10/21 0017 01/10/21 0430  AST 24 22  ALT 16 15  ALKPHOS 65 63  BILITOT 6.4* 6.0*  PROT 6.6 6.4*  ALBUMIN 3.0* 2.9*   No results for input(s): LIPASE, AMYLASE in the last 168 hours. Recent Labs  Lab 01/10/21 0430  AMMONIA 78*   Coagulation Profile: Recent Labs  Lab 01/10/21 0017  INR 1.4*   Cardiac Enzymes: No results for input(s): CKTOTAL, CKMB, CKMBINDEX, TROPONINI in the last 168 hours. BNP (last 3 results) No results for input(s): PROBNP in the last 8760 hours. HbA1C:  No results for input(s): HGBA1C in the last 72 hours. CBG: Recent Labs  Lab 01/10/21 0428  GLUCAP 189*   Lipid Profile: No results for input(s): CHOL, HDL, LDLCALC, TRIG, CHOLHDL, LDLDIRECT in the last 72 hours. Thyroid Function Tests: Recent Labs    01/10/21 0430  TSH 1.135   Anemia Panel: No results for input(s): VITAMINB12, FOLATE, FERRITIN, TIBC, IRON, RETICCTPCT in the last 72 hours. Sepsis Labs: No results for input(s): PROCALCITON, LATICACIDVEN in the last 168 hours.  Recent Results (from the past 240 hour(s))  MRSA PCR Screening     Status: None   Collection Time: 01/10/21  5:28 AM   Specimen: Nasopharyngeal  Result Value Ref Range Status   MRSA by PCR NEGATIVE NEGATIVE Final    Comment:        The GeneXpert MRSA Assay (FDA approved for NASAL specimens only), is one component of a comprehensive MRSA colonization surveillance program. It is not intended to diagnose MRSA infection nor to guide or monitor treatment for MRSA infections. Performed at [facility], [place] [address], [place], [place] [address]           Radiology Studies: CT Head Wo Contrast  Result Date: 01/10/2021 CLINICAL DATA:  Acute neurological deficit. EXAM: CT HEAD WITHOUT CONTRAST TECHNIQUE: Contiguous axial images were obtained from the base of the skull through the vertex without intravenous contrast. COMPARISON:  03/20/2018 FINDINGS: Brain: No evidence of acute infarction, hemorrhage, hydrocephalus, extra-axial collection or mass lesion/mass effect. Vascular: Moderate intracranial arterial calcifications. Skull: Calvarium appears intact. Sinuses/Orbits: Paranasal sinuses and mastoid air cells are clear. Other: None. IMPRESSION: No acute intracranial abnormalities. Electronically Signed   By: Lucienne Capers M.D.   On: 01/10/2021 02:19   CT Angio Chest PE W and/or Wo Contrast  Result Date: 01/10/2021 CLINICAL DATA:  Chest pain and shortness of breath. Altered mental status. Atrial fibrillation. EXAM: CT ANGIOGRAPHY CHEST WITH CONTRAST TECHNIQUE: Multidetector CT imaging of the chest was performed using the standard protocol during bolus administration of intravenous contrast. Multiplanar CT image reconstructions and MIPs were obtained to evaluate the vascular anatomy. CONTRAST:  135m OMNIPAQUE IOHEXOL 350 MG/ML SOLN COMPARISON:  03/15/2020 FINDINGS: Cardiovascular: Examination is technically limited due to body habitus and poor contrast bolus. Due to technical factors, the examination is nondiagnostic for evaluation of pulmonary embolus. Diffuse cardiac enlargement. Normal caliber thoracic aorta. Great vessel origins are patent. Aberrant right subclavian artery. Mediastinum/Nodes: Esophagus is decompressed. No significant lymphadenopathy in the chest. Lungs/Pleura: Small bilateral pleural effusions with basilar atelectasis. Patchy perihilar infiltrates likely due to edema. Upper Abdomen: Upper abdominal contents suggest cirrhotic configuration of the liver with splenic enlargement. Prominent varices are suggested. Musculoskeletal:  Degenerative changes in the spine. Review of the MIP images confirms the above findings. IMPRESSION: 1. Technically limited study due to body habitus and poor contrast bolus. Due to technical factors, the examination is nondiagnostic for evaluation of pulmonary embolus. 2. Diffuse cardiac enlargement with small bilateral pleural effusions and perihilar infiltrates likely due to edema. 3. Aberrant right subclavian artery. 4. Probable cirrhotic configuration of the liver with splenic enlargement and varices. 5. Aortic atherosclerosis. Aortic Atherosclerosis (ICD10-I70.0). Electronically Signed   By: WLucienne CapersM.D.   On: 01/10/2021 02:24   DG Chest Portable 1 View  Result Date: 01/10/2021 CLINICAL DATA:  Atrial fibrillation. EXAM: PORTABLE CHEST 1 VIEW COMPARISON:  03/16/2020 FINDINGS: Shallow inspiration. Cardiac enlargement with vascular congestion and perihilar infiltration, likely due to edema. Small bilateral pleural effusions. No pneumothorax. IMPRESSION: No  active disease. Electronically Signed   By: Lucienne Capers M.D.   On: 01/10/2021 00:56    Scheduled Meds: . furosemide  40 mg Intravenous BID  . insulin aspart  0-9 Units Subcutaneous Q4H   Continuous Infusions: . cefTRIAXone (ROCEPHIN)  IV    . diltiazem (CARDIZEM) infusion 15 mg/hr (01/10/21 0314)     LOS: 0 days   Time spent: 88mn  Kacy Conely C Fairley Copher, DO [facility]  If 7PM-7AM, please contact night-coverage [email or web address]  01/10/2021, 7:21 AM

## 2021-01-11 ENCOUNTER — Ambulatory Visit: Payer: BC Managed Care – PPO | Admitting: Cardiovascular Disease

## 2021-01-11 DIAGNOSIS — I4891 Unspecified atrial fibrillation: Secondary | ICD-10-CM | POA: Diagnosis not present

## 2021-01-11 LAB — COMPREHENSIVE METABOLIC PANEL
ALT: 14 U/L (ref 0–44)
AST: 34 U/L (ref 15–41)
Albumin: 3 g/dL — ABNORMAL LOW (ref 3.5–5.0)
Alkaline Phosphatase: 61 U/L (ref 38–126)
Anion gap: 11 (ref 5–15)
BUN: 19 mg/dL (ref 8–23)
CO2: 25 mmol/L (ref 22–32)
Calcium: 8.8 mg/dL — ABNORMAL LOW (ref 8.9–10.3)
Chloride: 106 mmol/L (ref 98–111)
Creatinine, Ser: 0.84 mg/dL (ref 0.44–1.00)
GFR, Estimated: >60 mL/min (ref >60)
Glucose, Bld: 118 mg/dL — ABNORMAL HIGH (ref 70–99)
Potassium: 4.9 mmol/L (ref 3.5–5.1)
Sodium: 142 mmol/L (ref 135–145)
Total Bilirubin: 4.9 mg/dL — ABNORMAL HIGH (ref 0.3–1.2)
Total Protein: 6.4 g/dL — ABNORMAL LOW (ref 6.5–8.1)

## 2021-01-11 LAB — CBC
HCT: 39.3 % (ref 36.0–46.0)
Hemoglobin: 12.1 g/dL (ref 12.0–15.0)
MCH: 28.2 pg (ref 26.0–34.0)
MCHC: 30.8 g/dL (ref 30.0–36.0)
MCV: 91.6 fL (ref 80.0–100.0)
RBC: 4.29 MIL/uL (ref 3.87–5.11)
RDW: 20.1 % — ABNORMAL HIGH (ref 11.5–15.5)
WBC: 7.3 10*3/uL (ref 4.0–10.5)
nRBC: 0 % (ref 0.0–0.2)

## 2021-01-11 LAB — GLUCOSE, CAPILLARY
Glucose-Capillary: 108 mg/dL — ABNORMAL HIGH (ref 70–99)
Glucose-Capillary: 113 mg/dL — ABNORMAL HIGH (ref 70–99)
Glucose-Capillary: 113 mg/dL — ABNORMAL HIGH (ref 70–99)
Glucose-Capillary: 138 mg/dL — ABNORMAL HIGH (ref 70–99)
Glucose-Capillary: 144 mg/dL — ABNORMAL HIGH (ref 70–99)
Glucose-Capillary: 138 mg/dL — ABNORMAL HIGH (ref 70–99)
Glucose-Capillary: 121 mg/dL — ABNORMAL HIGH (ref 70–99)

## 2021-01-11 MED ORDER — DILTIAZEM HCL ER COATED BEADS 180 MG PO CP24
360.0000 mg | ORAL_CAPSULE | Freq: Every day | ORAL | Status: DC
  Administered 2021-01-11 – 2021-01-16 (×6): 360 mg via ORAL
  Filled 2021-01-11 (×6): qty 2

## 2021-01-11 MED ORDER — FUROSEMIDE 10 MG/ML IJ SOLN
60.0000 mg | Freq: Two times a day (BID) | INTRAMUSCULAR | Status: DC
  Administered 2021-01-11 – 2021-01-14 (×6): 60 mg via INTRAVENOUS
  Filled 2021-01-11 (×6): qty 6

## 2021-01-11 MED ORDER — LIDOCAINE 5 % EX PTCH
1.0000 | MEDICATED_PATCH | CUTANEOUS | Status: DC
  Administered 2021-01-11 – 2021-01-21 (×8): 1 via TRANSDERMAL
  Filled 2021-01-11 (×12): qty 1

## 2021-01-11 NOTE — Progress Notes (Signed)
Pt has home CPAP.

## 2021-01-11 NOTE — Progress Notes (Signed)
PROGRESS NOTE    Hannah Khan  OEH:[id number] DOB: 1956/08/20 DOA: 01/10/2021 PCP: Lujean Amel, MD   Brief Narrative:  Hannah Khan is a 65 y.o. female with medical history significant for cirrhosis with varices, chronic thrombocytopenia, atrial fibrillation not anticoagulated due to variceal bleed and low platelets, cirrhosis, type 2 diabetes mellitus, OSA, BMI 55, and right shoulder surgery on 12/30/2020, now presenting to the emergency department for evaluation of altered mental status and increased somnolence. She has been taking oxycodone every 4-6 hours and Flexeril every 8 hours since the recent surgery, has been using her CPAP as directed, and had been taking her medications until just prior to admission due to somnolence.   In afib with RVR and appears to be volume overloaded.   Assessment & Plan:   Principal Problem:   Atrial fibrillation with RVR (HCC) Active Problems:   Obstructive sleep apnea   Acute on chronic diastolic CHF (congestive heart failure) (HCC)   Thrombocytopenia (HCC)   Acute respiratory failure with hypoxia (HCC)   Type 2 diabetes mellitus with other specified complication (HCC)   Acute lower UTI   Liver cirrhosis secondary to NASH (nonalcoholic steatohepatitis) (HCC)   Acute encephalopathy   Acute metabolic encephalopathy Likely in the setting of infection as below Rule out polypharmacy  - Presented with somnolence and confusion  - No acute findings on head CT; not hypercarbic on ABG  - Plan to hold oxycodone and Flexeril, treat UTI,  Ammonia mildly elevated - UA concerning for UTI, patient unable to cooperate symptoms but hematuria noted at intake  -continue ceftriaxone, patient does not meet sepsis criteria -urine culture pending  Atrial fibrillation with RVR, improving  - Remains tachycardic on IV diltiazem drip - Concern for provoked A. fib in the setting of infection and likely medication noncompliance given her somnolence she was  unable to take p.o. at home and was giving medications per report - CHADS-VASc 5 -remains off anticoagulation in the setting of variceal bleeding and thrombocytopenia - did not get PO medications yesterday due to reported somnulence -will give PO meds today and then titrate off gtt  Acute hypoxic respiratory failure  Likely in the setting of obesity hypoventilation, OSA and polypharmacy/infection plus acute on chronic diastolic CHF  - continue Lasix  - Patient has known sleep apnea- resume CPAP -Continue to wean oxygen as tolerated, at baseline patient is not on oxygen -CTA chest has poor timing for contrast bolus and nondiagnostic but no clear indication for infection or pneumonia  Chronic cirrhosis  - Attributed to NASH with hx of variceal bleeding  -Ammonia 78 -bowel regimen  Thrombocytopenia, chronic  - Platelet count pending, chronically-low, likely d/t cirrhosis and splenomegaly  - Likely acute on chronic at this point due to recent surgery and current illness  Recent shoulder surgery -Pain appears to be well controlled -With Dr. Berenice Primas in [place], continue bandage changes as indicated.  DVT prophylaxis: SCDs only, given thrombocytopenia and history of bleeding Code Status: Full Family Communication: husband at bedside  Status is: Inpatient  Dispo: The patient is from: Home              Anticipated d/c is to: To be determined              Anticipated d/c date is: 48 to 72 hours              Patient currently not medically stable for discharge    Subjective: Asking to eat  Objective: Vitals:  01/11/21 0200 01/11/21 0300 01/11/21 0740 01/11/21 1017  BP: 135/69 126/74 (!) 141/80 (!) 149/81  Pulse:  (!) 117 (!) 117 (!) 127  Resp:  15 18   Temp:  98.4 F (36.9 C) 97.8 F (36.6 C)   TempSrc:  Oral Oral   SpO2:  94% 98%   Weight:  (!) 171 kg    Height:        Intake/Output Summary (Last 24 hours) at 01/11/2021 1025 Last data filed at 01/11/2021  5916 Gross per 24 hour  Intake 665.27 ml  Output 2200 ml  Net -1534.73 ml   Filed Weights   01/10/21 1056 01/11/21 0300  Weight: (!) 159.7 kg (!) 171 kg    Examination:   General: Appearance:    Severely obese female in no acute distress     Lungs:    on Percival, improved work of breathing with speaking, diminished  Heart:    Tachycardic. irregular  MS:   All extremities are intact. +edema, +B/L skin changes on LE  Neurologic:   Awake, alert, oriented x 3. No apparent focal neurological           defect.    Data Reviewed: I have personally reviewed following labs and imaging studies  CBC: Recent Labs  Lab 01/10/21 0017 01/10/21 0046 01/10/21 0120 01/10/21 0430 01/11/21 0039  WBC 7.4  --   --  6.4 7.3  NEUTROABS 6.2  --   --   --   --   HGB 11.4* 11.2* 11.2* 10.9* 12.1  HCT 36.9 33.0* 33.0* 35.3* 39.3  MCV 90.7  --   --  91.0 91.6  PLT PLATELET CLUMPS NOTED ON SMEAR, UNABLE TO ESTIMATE  --   --  PLATELET CLUMPING, SUGGEST RECOLLECTION OF SAMPLE IN CITRATE TUBE. PLATELET CLUMPING, SUGGEST RECOLLECTION OF SAMPLE IN CITRATE TUBE.   Basic Metabolic Panel: Recent Labs  Lab 01/10/21 0017 01/10/21 0046 01/10/21 0120 01/10/21 0430 01/11/21 0039  NA 138 141 140 137 142  K 4.4 4.2 4.3 4.0 4.9  CL 104  --  106 104 106  CO2 24  --   --  23 25  GLUCOSE 169*  --  171* 206* 118*  BUN 17  --  18 18 19   CREATININE 0.86  --  0.70 0.80 0.84  CALCIUM 8.9  --   --  8.7* 8.8*  MG  --   --   --  1.9  --    GFR: Estimated Creatinine Clearance: 112.6 mL/min (by C-G formula based on SCr of 0.84 mg/dL). Liver Function Tests: Recent Labs  Lab 01/10/21 0017 01/10/21 0430 01/11/21 0039  AST 24 22 34  ALT 16 15 14   ALKPHOS 65 63 61  BILITOT 6.4* 6.0* 4.9*  PROT 6.6 6.4* 6.4*  ALBUMIN 3.0* 2.9* 3.0*   No results for input(s): LIPASE, AMYLASE in the last 168 hours. Recent Labs  Lab 01/10/21 0430  AMMONIA 78*   Coagulation Profile: Recent Labs  Lab 01/10/21 0017  INR 1.4*    Cardiac Enzymes: No results for input(s): CKTOTAL, CKMB, CKMBINDEX, TROPONINI in the last 168 hours. BNP (last 3 results) No results for input(s): PROBNP in the last 8760 hours. HbA1C: No results for input(s): HGBA1C in the last 72 hours. CBG: Recent Labs  Lab 01/10/21 1545 01/10/21 2015 01/10/21 2353 01/11/21 0449 01/11/21 0818  GLUCAP 133* 141* 118* 108* 113*   Lipid Profile: No results for input(s): CHOL, HDL, LDLCALC, TRIG, CHOLHDL, LDLDIRECT in the last 72  hours. Thyroid Function Tests: Recent Labs    01/10/21 0430  TSH 1.135   Anemia Panel: No results for input(s): VITAMINB12, FOLATE, FERRITIN, TIBC, IRON, RETICCTPCT in the last 72 hours. Sepsis Labs: No results for input(s): PROCALCITON, LATICACIDVEN in the last 168 hours.  Recent Results (from the past 240 hour(s))  SARS CORONAVIRUS 2 (TAT 6-24 HRS) Nasopharyngeal Nasopharyngeal Swab     Status: None   Collection Time: 01/10/21  3:58 AM   Specimen: Nasopharyngeal Swab  Result Value Ref Range Status   SARS Coronavirus 2 NEGATIVE NEGATIVE Final    Comment: (NOTE) SARS-CoV-2 target nucleic acids are NOT DETECTED.  The SARS-CoV-2 RNA is generally detectable in upper and lower respiratory specimens during the acute phase of infection. Negative results do not preclude SARS-CoV-2 infection, do not rule out co-infections with other pathogens, and should not be used as the sole basis for treatment or other patient management decisions. Negative results must be combined with clinical observations, patient history, and epidemiological information. The expected result is Negative.  Fact Sheet for Patients: SugarRoll.be  Fact Sheet for Healthcare Providers: https://www.woods-mathews.com/  This test is not yet approved or cleared by the [place] FDA and  has been authorized for detection and/or diagnosis of SARS-CoV-2 by FDA under an Emergency Use Authorization (EUA).  This EUA will remain  in effect (meaning this test can be used) for the duration of the COVID-19 declaration under Se ction 564(b)(1) of the Act, 21 U.S.C. section 360bbb-3(b)(1), unless the authorization is terminated or revoked sooner.  Performed at [facility], [place] [address] [place], [place] [address]   MRSA PCR Screening     Status: None   Collection Time: 01/10/21  5:28 AM   Specimen: Nasopharyngeal  Result Value Ref Range Status   MRSA by PCR NEGATIVE NEGATIVE Final    Comment:        The GeneXpert MRSA Assay (FDA approved for NASAL specimens only), is one component of a comprehensive MRSA colonization surveillance program. It is not intended to diagnose MRSA infection nor to guide or monitor treatment for MRSA infections. Performed at [facility], [place] [address] [place], [place] [address]          Radiology Studies: CT Head Wo Contrast  Result Date: 01/10/2021 CLINICAL DATA:  Acute neurological deficit. EXAM: CT HEAD WITHOUT CONTRAST TECHNIQUE: Contiguous axial images were obtained from the base of the skull through the vertex without intravenous contrast. COMPARISON:  03/20/2018 FINDINGS: Brain: No evidence of acute infarction, hemorrhage, hydrocephalus, extra-axial collection or mass lesion/mass effect. Vascular: Moderate intracranial arterial calcifications. Skull: Calvarium appears intact. Sinuses/Orbits: Paranasal sinuses and mastoid air cells are clear. Other: None. IMPRESSION: No acute intracranial abnormalities. Electronically Signed   By: Lucienne Capers M.D.   On: 01/10/2021 02:19   CT Angio Chest PE W and/or Wo Contrast  Result Date: 01/10/2021 CLINICAL DATA:  Chest pain and shortness of breath. Altered mental status. Atrial fibrillation. EXAM: CT ANGIOGRAPHY CHEST WITH CONTRAST TECHNIQUE: Multidetector CT imaging of the chest was performed using the standard protocol during bolus administration of intravenous contrast. Multiplanar CT image  reconstructions and MIPs were obtained to evaluate the vascular anatomy. CONTRAST:  173m OMNIPAQUE IOHEXOL 350 MG/ML SOLN COMPARISON:  03/15/2020 FINDINGS: Cardiovascular: Examination is technically limited due to body habitus and poor contrast bolus. Due to technical factors, the examination is nondiagnostic for evaluation of pulmonary embolus. Diffuse cardiac enlargement. Normal caliber thoracic aorta. Great vessel origins are patent. Aberrant right subclavian  artery. Mediastinum/Nodes: Esophagus is decompressed. No significant lymphadenopathy in the chest. Lungs/Pleura: Small bilateral pleural effusions with basilar atelectasis. Patchy perihilar infiltrates likely due to edema. Upper Abdomen: Upper abdominal contents suggest cirrhotic configuration of the liver with splenic enlargement. Prominent varices are suggested. Musculoskeletal: Degenerative changes in the spine. Review of the MIP images confirms the above findings. IMPRESSION: 1. Technically limited study due to body habitus and poor contrast bolus. Due to technical factors, the examination is nondiagnostic for evaluation of pulmonary embolus. 2. Diffuse cardiac enlargement with small bilateral pleural effusions and perihilar infiltrates likely due to edema. 3. Aberrant right subclavian artery. 4. Probable cirrhotic configuration of the liver with splenic enlargement and varices. 5. Aortic atherosclerosis. Aortic Atherosclerosis (ICD10-I70.0). Electronically Signed   By: Lucienne Capers M.D.   On: 01/10/2021 02:24   DG Chest Portable 1 View  Result Date: 01/10/2021 CLINICAL DATA:  Atrial fibrillation. EXAM: PORTABLE CHEST 1 VIEW COMPARISON:  03/16/2020 FINDINGS: Shallow inspiration. Cardiac enlargement with vascular congestion and perihilar infiltration, likely due to edema. Small bilateral pleural effusions. No pneumothorax. IMPRESSION: No active disease. Electronically Signed   By: Lucienne Capers M.D.   On: 01/10/2021 00:56    Scheduled  Meds: . diltiazem  360 mg Oral Daily  . furosemide  60 mg Intravenous BID  . insulin aspart  0-9 Units Subcutaneous Q4H  . lidocaine  1 patch Transdermal Q24H  . metoprolol tartrate  50 mg Oral BID   Continuous Infusions: . cefTRIAXone (ROCEPHIN)  IV 200 mL/hr at 01/10/21 2344  . diltiazem (CARDIZEM) infusion 15 mg/hr (01/11/21 0330)     LOS: 1 day   Time spent: 8mn  Endora Teresi U Yazleen Molock, DO [facility]  If 7PM-7AM, please contact night-coverage [email or web address]  01/11/2021, 10:25 AM

## 2021-01-11 NOTE — Plan of Care (Signed)
  Problem: Education: Goal: Knowledge of General Education information will improve Description: Including pain rating scale, medication(s)/side effects and non-pharmacologic comfort measures Outcome: Progressing   Problem: Clinical Measurements: Goal: Ability to maintain clinical measurements within normal limits will improve Outcome: Progressing Goal: Will remain free from infection Outcome: Progressing   Problem: Elimination: Goal: Will not experience complications related to urinary retention Outcome: Progressing

## 2021-01-12 ENCOUNTER — Inpatient Hospital Stay: Payer: BC Managed Care – PPO | Admitting: Oncology

## 2021-01-12 ENCOUNTER — Inpatient Hospital Stay: Payer: BC Managed Care – PPO

## 2021-01-12 DIAGNOSIS — I4891 Unspecified atrial fibrillation: Secondary | ICD-10-CM | POA: Diagnosis not present

## 2021-01-12 LAB — GLUCOSE, CAPILLARY
Glucose-Capillary: 115 mg/dL — ABNORMAL HIGH (ref 70–99)
Glucose-Capillary: 127 mg/dL — ABNORMAL HIGH (ref 70–99)
Glucose-Capillary: 135 mg/dL — ABNORMAL HIGH (ref 70–99)
Glucose-Capillary: 139 mg/dL — ABNORMAL HIGH (ref 70–99)
Glucose-Capillary: 141 mg/dL — ABNORMAL HIGH (ref 70–99)
Glucose-Capillary: 163 mg/dL — ABNORMAL HIGH (ref 70–99)

## 2021-01-12 LAB — COMPREHENSIVE METABOLIC PANEL
ALT: 15 U/L (ref 0–44)
AST: 30 U/L (ref 15–41)
Albumin: 2.7 g/dL — ABNORMAL LOW (ref 3.5–5.0)
Alkaline Phosphatase: 54 U/L (ref 38–126)
Anion gap: 9 (ref 5–15)
BUN: 20 mg/dL (ref 8–23)
CO2: 28 mmol/L (ref 22–32)
Calcium: 8.5 mg/dL — ABNORMAL LOW (ref 8.9–10.3)
Chloride: 103 mmol/L (ref 98–111)
Creatinine, Ser: 0.83 mg/dL (ref 0.44–1.00)
GFR, Estimated: >60 mL/min (ref >60)
Glucose, Bld: 134 mg/dL — ABNORMAL HIGH (ref 70–99)
Potassium: 3.3 mmol/L — ABNORMAL LOW (ref 3.5–5.1)
Sodium: 140 mmol/L (ref 135–145)
Total Bilirubin: 3.6 mg/dL — ABNORMAL HIGH (ref 0.3–1.2)
Total Protein: 6 g/dL — ABNORMAL LOW (ref 6.5–8.1)

## 2021-01-12 LAB — CBC
HCT: 36.1 % (ref 36.0–46.0)
Hemoglobin: 11.3 g/dL — ABNORMAL LOW (ref 12.0–15.0)
MCH: 28.6 pg (ref 26.0–34.0)
MCHC: 31.3 g/dL (ref 30.0–36.0)
MCV: 91.4 fL (ref 80.0–100.0)
Platelets: UNDETERMINED 10*3/uL (ref 150–400)
RBC: 3.95 MIL/uL (ref 3.87–5.11)
RDW: 19.8 % — ABNORMAL HIGH (ref 11.5–15.5)
WBC: 4.5 10*3/uL (ref 4.0–10.5)
nRBC: 0 % (ref 0.0–0.2)

## 2021-01-12 LAB — URINE CULTURE: Culture: >=100000 — AB

## 2021-01-12 LAB — AMMONIA: Ammonia: 62 umol/L — ABNORMAL HIGH (ref 9–35)

## 2021-01-12 LAB — PLATELET COUNT: Platelets: UNDETERMINED 10*3/uL (ref 150–400)

## 2021-01-12 MED ORDER — POTASSIUM CHLORIDE CRYS ER 20 MEQ PO TBCR
40.0000 meq | EXTENDED_RELEASE_TABLET | Freq: Two times a day (BID) | ORAL | Status: DC
  Administered 2021-01-12: 40 meq via ORAL
  Filled 2021-01-12 (×2): qty 2

## 2021-01-12 MED ORDER — POTASSIUM CHLORIDE CRYS ER 20 MEQ PO TBCR
40.0000 meq | EXTENDED_RELEASE_TABLET | Freq: Once | ORAL | Status: AC
  Administered 2021-01-12: 40 meq via ORAL
  Filled 2021-01-12: qty 2

## 2021-01-12 MED ORDER — POTASSIUM CHLORIDE 20 MEQ PO PACK
40.0000 meq | PACK | Freq: Two times a day (BID) | ORAL | Status: DC
  Administered 2021-01-12: 40 meq via ORAL
  Filled 2021-01-12 (×3): qty 2

## 2021-01-12 MED ORDER — POTASSIUM CHLORIDE CRYS ER 20 MEQ PO TBCR: 40.0000 meq | EXTENDED_RELEASE_TABLET | Freq: Every day | ORAL | Status: DC

## 2021-01-12 MED ORDER — MAGNESIUM SULFATE 2 GM/50ML IV SOLN
2.0000 g | Freq: Once | INTRAVENOUS | Status: AC
  Administered 2021-01-12: 2 g via INTRAVENOUS
  Filled 2021-01-12: qty 50

## 2021-01-12 NOTE — Progress Notes (Signed)
PROGRESS NOTE    ANALEESE ANDREATTA  NOB:[id number] DOB: April 22, 1956 DOA: 01/10/2021 PCP: Lujean Amel, MD   Brief Narrative:  Hannah Khan is a 65 y.o. female with medical history significant for cirrhosis with varices, chronic thrombocytopenia, atrial fibrillation not anticoagulated due to variceal bleed and low platelets, cirrhosis, type 2 diabetes mellitus, OSA, BMI 55, and right shoulder surgery on 12/30/2020, now presenting to the emergency department for evaluation of altered mental status and increased somnolence. She has been taking oxycodone every 4-6 hours and Flexeril every 8 hours since the recent surgery, has been using her CPAP as directed, and had been taking her medications until just prior to admission due to somnolence.   In afib with RVR and appears to be volume overloaded.   Assessment & Plan:   Principal Problem:   Atrial fibrillation with RVR (HCC) Active Problems:   Obstructive sleep apnea   Acute on chronic diastolic CHF (congestive heart failure) (HCC)   Thrombocytopenia (HCC)   Acute respiratory failure with hypoxia (HCC)   Type 2 diabetes mellitus with other specified complication (HCC)   Acute lower UTI   Liver cirrhosis secondary to NASH (nonalcoholic steatohepatitis) (HCC)   Acute encephalopathy   Acute metabolic encephalopathy Likely in the setting of infection as below Rule out polypharmacy  - Presented with somnolence and confusion  - No acute findings on head CT; not hypercarbic on ABG  - Plan to hold oxycodone and Flexeril, treat UTI,  Ammonia mildly elevated - UA concerning for UTI, patient unable to cooperate symptoms but hematuria noted at intake  -continue ceftriaxone, patient does not meet sepsis criteria -urine culture pending  Atrial fibrillation with RVR, improving  - Remains tachycardic on IV diltiazem drip - Concern for provoked A. fib in the setting of infection and likely medication noncompliance given her somnolence she was  unable to take p.o. at home and was giving medications per report - CHADS-VASc 5 -remains off anticoagulation in the setting of variceal bleeding and thrombocytopenia -resumed home meds for rate control  Acute hypoxic respiratory failure  Likely in the setting of obesity hypoventilation, OSA and polypharmacy/infection plus acute on chronic diastolic CHF  - continue Lasix  - Patient has known sleep apnea- resume CPAP -Continue to wean oxygen as tolerated, at baseline patient is not on oxygen -CTA chest has poor timing for contrast bolus and nondiagnostic but no clear indication for infection or pneumonia  Hypokalemia -replete  Chronic cirrhosis  - Attributed to NASH with hx of variceal bleeding  -Ammonia 78 -bowel regimen  Thrombocytopenia, chronic  - chronically-low, likely d/t cirrhosis and splenomegaly   Recent shoulder surgery -With Dr. Berenice Primas in [place], continue bandage changes as indicated.  DVT prophylaxis: SCDs only, given thrombocytopenia and history of bleeding Code Status: Full Family Communication: husband at bedside  Status is: Inpatient  Dispo: The patient is from: Home              Anticipated d/c is to: To be determined- PT Eval placed              Anticipated d/c date is: 48 to 72 hours              Patient currently not medically stable for discharge    Subjective: Did not sleep well last night  Objective: Vitals:   01/12/21 0600 01/12/21 0753 01/12/21 1019 01/12/21 1114  BP:  111/65 138/76 111/66  Pulse:  (!) 105 (!) 120 (!) 109  Resp: 16 17  18  Temp:  97.7 F (36.5 C)  (!) 97.5 F (36.4 C)  TempSrc:  Oral  Oral  SpO2: 94% 92%  96%  Weight:      Height:        Intake/Output Summary (Last 24 hours) at 01/12/2021 1136 Last data filed at 01/12/2021 1115 Gross per 24 hour  Intake 902.33 ml  Output 4150 ml  Net -3247.67 ml   Filed Weights   01/10/21 1056 01/11/21 0300 01/12/21 0300  Weight: (!) 159.7 kg (!) 171 kg (!) 168.5 kg     Examination:   General: Appearance:    Severely obese female in no acute distress     Lungs:     diminished, respirations unlabored  Heart:    Tachycardic. irr  MS:   All extremities are intact. +LE edema  Neurologic:   Awake, alert, oriented x 3.    Data Reviewed: I have personally reviewed following labs and imaging studies  CBC: Recent Labs  Lab 01/10/21 0017 01/10/21 0046 01/10/21 0120 01/10/21 0430 01/11/21 0039 01/12/21 0048  WBC 7.4  --   --  6.4 7.3 4.5  NEUTROABS 6.2  --   --   --   --   --   HGB 11.4* 11.2* 11.2* 10.9* 12.1 11.3*  HCT 36.9 33.0* 33.0* 35.3* 39.3 36.1  MCV 90.7  --   --  91.0 91.6 91.4  PLT PLATELET CLUMPS NOTED ON SMEAR, UNABLE TO ESTIMATE  --   --  PLATELET CLUMPING, SUGGEST RECOLLECTION OF SAMPLE IN CITRATE TUBE. PLATELET CLUMPING, SUGGEST RECOLLECTION OF SAMPLE IN CITRATE TUBE. PLATELET CLUMPS NOTED ON SMEAR, UNABLE TO ESTIMATE   Basic Metabolic Panel: Recent Labs  Lab 01/10/21 0017 01/10/21 0046 01/10/21 0120 01/10/21 0430 01/11/21 0039 01/12/21 0048  NA 138 141 140 137 142 140  K 4.4 4.2 4.3 4.0 4.9 3.3*  CL 104  --  106 104 106 103  CO2 24  --   --  23 25 28   GLUCOSE 169*  --  171* 206* 118* 134*  BUN 17  --  18 18 19 20   CREATININE 0.86  --  0.70 0.80 0.84 0.83  CALCIUM 8.9  --   --  8.7* 8.8* 8.5*  MG  --   --   --  1.9  --   --    GFR: Estimated Creatinine Clearance: 112.9 mL/min (by C-G formula based on SCr of 0.83 mg/dL). Liver Function Tests: Recent Labs  Lab 01/10/21 0017 01/10/21 0430 01/11/21 0039 01/12/21 0048  AST 24 22 34 30  ALT 16 15 14 15   ALKPHOS 65 63 61 54  BILITOT 6.4* 6.0* 4.9* 3.6*  PROT 6.6 6.4* 6.4* 6.0*  ALBUMIN 3.0* 2.9* 3.0* 2.7*   No results for input(s): LIPASE, AMYLASE in the last 168 hours. Recent Labs  Lab 01/10/21 0430 01/12/21 0048  AMMONIA 78* 62*   Coagulation Profile: Recent Labs  Lab 01/10/21 0017  INR 1.4*   Cardiac Enzymes: No results for input(s): CKTOTAL, CKMB,  CKMBINDEX, TROPONINI in the last 168 hours. BNP (last 3 results) No results for input(s): PROBNP in the last 8760 hours. HbA1C: No results for input(s): HGBA1C in the last 72 hours. CBG: Recent Labs  Lab 01/11/21 1955 01/11/21 2327 01/12/21 0307 01/12/21 0817 01/12/21 1116  GLUCAP 138* 121* 127* 115* 141*   Lipid Profile: No results for input(s): CHOL, HDL, LDLCALC, TRIG, CHOLHDL, LDLDIRECT in the last 72 hours. Thyroid Function Tests: Recent Labs  01/10/21 0430  TSH 1.135   Anemia Panel: No results for input(s): VITAMINB12, FOLATE, FERRITIN, TIBC, IRON, RETICCTPCT in the last 72 hours. Sepsis Labs: No results for input(s): PROCALCITON, LATICACIDVEN in the last 168 hours.  Recent Results (from the past 240 hour(s))  Urine culture     Status: None (Preliminary result)   Collection Time: 01/10/21  2:18 AM   Specimen: Urine, Random  Result Value Ref Range Status   Specimen Description URINE, RANDOM  Final   Special Requests NONE  Final   Culture   Final    CULTURE REINCUBATED FOR BETTER GROWTH Performed at [facility], [address] [place], [place] [address]    Report Status PENDING  Incomplete  SARS CORONAVIRUS 2 (TAT 6-24 HRS) Nasopharyngeal Nasopharyngeal Swab     Status: None   Collection Time: 01/10/21  3:58 AM   Specimen: Nasopharyngeal Swab  Result Value Ref Range Status   SARS Coronavirus 2 NEGATIVE NEGATIVE Final    Comment: (NOTE) SARS-CoV-2 target nucleic acids are NOT DETECTED.  The SARS-CoV-2 RNA is generally detectable in upper and lower respiratory specimens during the acute phase of infection. Negative results do not preclude SARS-CoV-2 infection, do not rule out co-infections with other pathogens, and should not be used as the sole basis for treatment or other patient management decisions. Negative results must be combined with clinical observations, patient history, and epidemiological information. The expected result is  Negative.  Fact Sheet for Patients: SugarRoll.be  Fact Sheet for Healthcare Providers: https://www.woods-mathews.com/  This test is not yet approved or cleared by the [place] FDA and  has been authorized for detection and/or diagnosis of SARS-CoV-2 by FDA under an Emergency Use Authorization (EUA). This EUA will remain  in effect (meaning this test can be used) for the duration of the COVID-19 declaration under Se ction 564(b)(1) of the Act, 21 U.S.C. section 360bbb-3(b)(1), unless the authorization is terminated or revoked sooner.  Performed at [facility], [place] [address] [place], [place] [address]   MRSA PCR Screening     Status: None   Collection Time: 01/10/21  5:28 AM   Specimen: Nasopharyngeal  Result Value Ref Range Status   MRSA by PCR NEGATIVE NEGATIVE Final    Comment:        The GeneXpert MRSA Assay (FDA approved for NASAL specimens only), is one component of a comprehensive MRSA colonization surveillance program. It is not intended to diagnose MRSA infection nor to guide or monitor treatment for MRSA infections. Performed at [facility], [place] [address] [place], [place] [address]          Radiology Studies: No results found.  Scheduled Meds: . diltiazem  360 mg Oral Daily  . furosemide  60 mg Intravenous BID  . insulin aspart  0-9 Units Subcutaneous Q4H  . lidocaine  1 patch Transdermal Q24H  . metoprolol tartrate  50 mg Oral BID  . [START ON 01/13/2021] potassium chloride  40 mEq Oral Daily   Continuous Infusions: . cefTRIAXone (ROCEPHIN)  IV Stopped (01/11/21 2200)  . magnesium sulfate bolus IVPB       LOS: 2 days   Time spent: 34mn  Letticia Bhattacharyya U Cassady Stanczak, DO [facility]  If 7PM-7AM, please contact night-coverage [email or web address]  01/12/2021, 11:36 AM

## 2021-01-12 NOTE — Evaluation (Signed)
Physical Therapy Evaluation Patient Details Name: Hannah Khan MRN: [id number] DOB: 20-Jan-1956 Today's Date: 01/12/2021   History of Present Illness  Pt is a 65 y.o. female with recent R shoulder sx (12/30/20), now admitted 01/10/21 with AMS, somnolence. Workup for acute hypoxic respiratory failure, acute metabolic encephalopathy; potential polypharmacy. Head CT negative for acute findings. UA concerning for UTI. PMH includes afib, DM2, OSA, cirrhosis, obesity.    Clinical Impression  Pt presents with an overall decrease in functional mobility secondary to above. PTA, pt requiring increased assist from husband since recent shoulder sx; pt ambulatory with rollator outside of home, husband assists with majority of ADLs. Today, pt able to perform seated and brief standing activity with up to minA for mobility. Pt limited by generalized weakness (including RUE post-op deficits), decreased activity tolerance and impaired balance. Increased time discussing d/c recommendations with pt and husband, including SNF-level therapies due to pt's recent decrease in independence; pt would like more information regarding SNF, but pt hopeful for return home with continued OP PT services for R shoulder. Will follow acutely to address established goals.  SpO2 86-90% on RA with activity, quick increase to 96% on 1L O2    Follow Up Recommendations SNF;Supervision for mobility/OOB    Equipment Recommendations  None recommended by PT    Recommendations for Other Services       Precautions / Restrictions Precautions Precautions: Fall Restrictions Weight Bearing Restrictions: Yes RUE Weight Bearing: Partial weight bearing Other Position/Activity Restrictions: Per patient report - can be partial weight bearing, but not full weight bearing yet      Mobility  Bed Mobility Overal bed mobility: Needs Assistance Bed Mobility: Supine to Sit     Supine to sit: Min assist;HOB elevated     General bed mobility  comments: Increased time and effort with heavy use of bed rail to elevate trunk to sitting, light minA for trunk elevation    Transfers Overall transfer level: Needs assistance Equipment used: None Transfers: Sit to/from Stand Sit to Stand: Min guard         General transfer comment: Reliant on momentum to power into standing, verbal cues to minimize WB through RUE pushing to stand, min guard for balance; 1x LOB with return to sit, poor eccentric control  Ambulation/Gait Ambulation/Gait assistance: Min guard;Min assist Gait Distance (Feet): 2 Feet Assistive device: None;1 person hand held assist Gait Pattern/deviations: Step-to pattern;Wide base of support Gait velocity: Decreased   General Gait Details: Slow side steps at EOB initially without min guard and pt losing balance back to sitting EOB, additional steps with minA for LUE HHA with improved stability; DOE 3/4 with SpO2 86-90% on RA  Stairs            Wheelchair Mobility    Modified Rankin (Stroke Patients Only)       Balance Overall balance assessment: Needs assistance   Sitting balance-Leahy Scale: Fair       Standing balance-Leahy Scale: Fair Standing balance comment: Can static stand without UE support, static and dynamic stability improved with UE support                             Pertinent Vitals/Pain Pain Assessment: Faces Faces Pain Scale: Hurts little more Pain Location: R shoulder Pain Descriptors / Indicators: Discomfort;Guarding Pain Intervention(s): Monitored during session;Limited activity within patient's tolerance    Home Living Family/patient expects to be discharged to:: Private residence Living Arrangements: Spouse/significant other  Available Help at Discharge: Family Type of Home: House Home Access: Stairs to enter Entrance Stairs-Rails: None Entrance Stairs-Number of Steps: 1 small step Home Layout: One level Home Equipment: [place] - 4 wheels;Cane - single  point;Tub bench Additional Comments: Husband disabled as well, having increased difficulty assisting pt at home. Children live nearby and can help sporadically    Prior Function Level of Independence: Needs assistance   Gait / Transfers Assistance Needed: Since recent shoulder sx, reports "furniture surfing" for ambulation inside; uses rollator outside. Husband assist with transfer into tub. Has been sleeping in recliner  ADL's / Homemaking Assistance Needed: Since recent shoulder sx, husband assists with dressing, toileting, bathing; husband performs all household tasks  Comments: Had just started with OP PT s/p R shoulder sx PTA. Does not wear home O2     Hand Dominance        Extremity/Trunk Assessment   Upper Extremity Assessment Upper Extremity Assessment: RUE deficits/detail RUE Deficits / Details: s/p recent R shoulder sx (12/30/20) with post-op shoulder ROM limitations (pt demonstrates forward flexion to ~45'; deferred additional ROM due to unknown restrictions/precautions)    Lower Extremity Assessment Lower Extremity Assessment: Generalized weakness;RLE deficits/detail;LLE deficits/detail RLE Deficits / Details: Funtionally at least 3/5 strength; noted dry skin in lower legs, as well as edema LLE Deficits / Details: Funtionally at least 3/5 strength; noted dry skin in lower legs, as well as edema       Communication   Communication: No difficulties  Cognition Arousal/Alertness: Awake/alert Behavior During Therapy: WFL for tasks assessed/performed Overall Cognitive Status: Within Functional Limits for tasks assessed                                 General Comments: WFL for simple tasks, not formally assessed. Required some redirection in conversation to stay on topic      General Comments General comments (skin integrity, edema, etc.): Pt's husband Coralyn Mark) present and supportive. Discussed d/c recommendations for SNF vs. return home with continued OP PT  services - pt and husband report increased difficulty with pt's mobility at home since recent sx, but ultimately they are hopeful for return home    Exercises     Assessment/Plan    PT Assessment Patient needs continued PT services  PT Problem List Decreased strength;Decreased range of motion;Decreased activity tolerance;Decreased balance;Decreased mobility;Decreased knowledge of precautions;Cardiopulmonary status limiting activity;Obesity;Pain       PT Treatment Interventions DME instruction;Gait training;Stair training;Functional mobility training;Therapeutic activities;Therapeutic exercise;Balance training;Patient/family education    PT Goals (Current goals can be found in the Care Plan section)  Acute Rehab PT Goals Patient Stated Goal: Hopeful for return home, but would like more information regarding SNF PT Goal Formulation: With patient/family Time For Goal Achievement: 01/26/21 Potential to Achieve Goals: Good    Frequency Min 3X/week   Barriers to discharge        Co-evaluation               AM-PAC PT "6 Clicks" Mobility  Outcome Measure Help needed turning from your back to your side while in a flat bed without using bedrails?: A Little Help needed moving from lying on your back to sitting on the side of a flat bed without using bedrails?: A Little Help needed moving to and from a bed to a chair (including a wheelchair)?: A Little Help needed standing up from a chair using your arms (e.g., wheelchair or bedside chair)?:  A Little Help needed to walk in hospital room?: A Little Help needed climbing 3-5 steps with a railing? : A Lot 6 Click Score: 17    End of Session   Activity Tolerance: Patient tolerated treatment well Patient left: in bed;with call bell/phone within reach;with family/visitor present;with bed alarm set (seated EOB to eat dinner) Nurse Communication: Mobility status;Other (comment) (need for bariatric BSC, bariatric recliner) PT Visit  Diagnosis: Other abnormalities of gait and mobility (R26.89);Pain Pain - Right/Left: Right Pain - part of body: Shoulder    Time: 1028-9022 PT Time Calculation (min) (ACUTE ONLY): 27 min   Charges:   PT Evaluation $PT Eval Moderate Complexity: 1 Mod PT Treatments $Therapeutic Activity: 8-22 mins       Mabeline Caras, PT, DPT Acute Rehabilitation Services  Pager [phone] Office [place] 01/12/2021, 5:31 PM

## 2021-01-12 NOTE — Progress Notes (Signed)
Patient brought home  Unit to hospital.  Made sure unit was plugged up and filled with water and ready to go for patient.  Patient stated she would like some help putting it on at 11pm.

## 2021-01-12 NOTE — Progress Notes (Signed)
Patient off oxygen therapy completely. Ambulated to bedside commode with stand by assist. However, patient became "stuck halfway" during a bowel improvement.  Impacted. This nurse manually disimpacted a moderate amount of stool. Patient unable to finish bowel movement. Patient requested that RN and tech finish peri care and she will try again later. PRN Docusate administered early. Patient may benefit from Miralax therapy. Patient did not tolerate PO Potassium tablets r/t N/V despite premedication with Zofran. Vann ok'd switch to potassium powder. Substantial yeast buildup appears to have colonized in inguinal folds. This nurse cleansed the area and applied nystatin anti-fungal powder. Information passed on to night RN.

## 2021-01-12 NOTE — Progress Notes (Signed)
Helped patient put her nasal mask on and started her home CPAP unit for her.

## 2021-01-12 NOTE — Plan of Care (Signed)
  Problem: Education: Goal: Knowledge of General Education information will improve Description: Including pain rating scale, medication(s)/side effects and non-pharmacologic comfort measures Outcome: Progressing   Problem: Health Behavior/Discharge Planning: Goal: Ability to manage health-related needs will improve Outcome: Progressing   Problem: Clinical Measurements: Goal: Ability to maintain clinical measurements within normal limits will improve Outcome: Progressing Goal: Will remain free from infection Outcome: Progressing Goal: Diagnostic test results will improve Outcome: Progressing Goal: Respiratory complications will improve Outcome: Progressing Goal: Cardiovascular complication will be avoided Outcome: Progressing   Problem: Elimination: Goal: Will not experience complications related to bowel motility Outcome: Progressing Goal: Will not experience complications related to urinary retention Outcome: Progressing   Problem: Pain Managment: Goal: General experience of comfort will improve Outcome: Progressing   Problem: Safety: Goal: Ability to remain free from injury will improve Outcome: Progressing   Problem: Skin Integrity: Goal: Risk for impaired skin integrity will decrease Outcome: Progressing   Problem: Activity: Goal: Risk for activity intolerance will decrease Outcome: Not Progressing   Problem: Nutrition: Goal: Adequate nutrition will be maintained Outcome: Not Progressing   Problem: Coping: Goal: Level of anxiety will decrease Outcome: Not Progressing

## 2021-01-13 DIAGNOSIS — I4891 Unspecified atrial fibrillation: Secondary | ICD-10-CM | POA: Diagnosis not present

## 2021-01-13 LAB — GLUCOSE, CAPILLARY
Glucose-Capillary: 123 mg/dL — ABNORMAL HIGH (ref 70–99)
Glucose-Capillary: 132 mg/dL — ABNORMAL HIGH (ref 70–99)
Glucose-Capillary: 133 mg/dL — ABNORMAL HIGH (ref 70–99)
Glucose-Capillary: 137 mg/dL — ABNORMAL HIGH (ref 70–99)
Glucose-Capillary: 171 mg/dL — ABNORMAL HIGH (ref 70–99)
Glucose-Capillary: 178 mg/dL — ABNORMAL HIGH (ref 70–99)

## 2021-01-13 LAB — COMPREHENSIVE METABOLIC PANEL
ALT: 15 U/L (ref 0–44)
AST: 27 U/L (ref 15–41)
Albumin: 2.8 g/dL — ABNORMAL LOW (ref 3.5–5.0)
Alkaline Phosphatase: 56 U/L (ref 38–126)
Anion gap: 5 (ref 5–15)
BUN: 19 mg/dL (ref 8–23)
CO2: 31 mmol/L (ref 22–32)
Calcium: 8.5 mg/dL — ABNORMAL LOW (ref 8.9–10.3)
Chloride: 101 mmol/L (ref 98–111)
Creatinine, Ser: 0.77 mg/dL (ref 0.44–1.00)
GFR, Estimated: >60 mL/min (ref >60)
Glucose, Bld: 132 mg/dL — ABNORMAL HIGH (ref 70–99)
Potassium: 3.7 mmol/L (ref 3.5–5.1)
Sodium: 137 mmol/L (ref 135–145)
Total Bilirubin: 5 mg/dL — ABNORMAL HIGH (ref 0.3–1.2)
Total Protein: 5.9 g/dL — ABNORMAL LOW (ref 6.5–8.1)

## 2021-01-13 LAB — CBC
HCT: 34.5 % — ABNORMAL LOW (ref 36.0–46.0)
Hemoglobin: 10.9 g/dL — ABNORMAL LOW (ref 12.0–15.0)
MCH: 28.2 pg (ref 26.0–34.0)
MCHC: 31.6 g/dL (ref 30.0–36.0)
MCV: 89.4 fL (ref 80.0–100.0)
Platelets: UNDETERMINED 10*3/uL (ref 150–400)
RBC: 3.86 MIL/uL — ABNORMAL LOW (ref 3.87–5.11)
RDW: 19.9 % — ABNORMAL HIGH (ref 11.5–15.5)
WBC: 5.5 10*3/uL (ref 4.0–10.5)
nRBC: 0 % (ref 0.0–0.2)

## 2021-01-13 LAB — MAGNESIUM: Magnesium: 1.8 mg/dL (ref 1.7–2.4)

## 2021-01-13 MED ORDER — SPIRONOLACTONE 25 MG PO TABS
25.0000 mg | ORAL_TABLET | Freq: Every day | ORAL | Status: DC
  Administered 2021-01-13 – 2021-01-16 (×4): 25 mg via ORAL
  Filled 2021-01-13 (×4): qty 1

## 2021-01-13 MED ORDER — EMPAGLIFLOZIN 25 MG PO TABS
25.0000 mg | ORAL_TABLET | Freq: Every day | ORAL | Status: DC
  Administered 2021-01-13: 25 mg via ORAL
  Filled 2021-01-13: qty 1

## 2021-01-13 MED ORDER — POTASSIUM CHLORIDE CRYS ER 20 MEQ PO TBCR
40.0000 meq | EXTENDED_RELEASE_TABLET | Freq: Two times a day (BID) | ORAL | Status: AC
  Administered 2021-01-13 (×2): 40 meq via ORAL
  Filled 2021-01-13 (×2): qty 2

## 2021-01-13 MED ORDER — LACTULOSE 10 GM/15ML PO SOLN
20.0000 g | Freq: Two times a day (BID) | ORAL | Status: DC
  Administered 2021-01-13 – 2021-01-14 (×3): 20 g via ORAL
  Filled 2021-01-13 (×3): qty 30

## 2021-01-13 MED ORDER — METFORMIN HCL 500 MG PO TABS
500.0000 mg | ORAL_TABLET | Freq: Every day | ORAL | Status: DC
  Administered 2021-01-14 – 2021-01-16 (×3): 500 mg via ORAL
  Filled 2021-01-13 (×3): qty 1

## 2021-01-13 NOTE — Progress Notes (Signed)
PROGRESS NOTE   Hannah Khan  BPZ:[id number] DOB: 10/12/1955 DOA: 01/10/2021 PCP: Lujean Amel, MD  Brief Narrative:   6 white female BMI 65 Cryptogenic cirrhosis + TCP NIDDM since 2017 OSA not on CPAP Fibromyalgia/MSK pain Colon adenoma status post polypectomy HFpEF A. fib CHADS2 score >4-not watchman or DCCV candidate Prior admissions for severe sepsis secondary to cellulitis lower extremity Recurrent admissions for RVR Last admission 03/14/2020-->03/18/2020 Streptococcus mitis bacteremia  She had recent shoulder surgery Dr. Berenice Primas in [facility] 12/30/2020-presented to [facility] ED acute altered mental status--had been discharged on oxycodone/Flexeril and using her CPAP as needed Developed incontinent urine continued to be somnolent confused On arrival EMS found her A. fib RVR rates 130s hypertensive 160 CXR negative for disease CT head negative for intracranial abnormality CT chest = bilateral pleural effusions pulmonary edema Initial Rx Rocephin Narcan Cardizem Ammonia found to be 78  Hospital-Problem based course   A. fib RVR CHADS2 score >4 not a candidate for watchman or DCCV Continue Cardizem 360 CD, metoprolol 50 twice daily Monitor trends Toxic metabolic encephalopathy probably combination of NASH cirrhosis, hyperammonemia in addition to fibromyalgia Additionally recent prescription opiates, muscle relaxants from shoulder surgery Start lactulose twice daily Discontinued prior to admission opiates etc. etc. HFpEF Resume Aldactone, metoprolol 50 twice daily, Cardizem 360 as above Monitor trends of potassium carefully Seems somewhat compensated--we will resume Bumex in the next 24 hours and consider readdition of metolazone subsequently in the outpatient [place] cirrhosis Hyperammonemia Start lactulose 20 twice daily Continue Aldactone which was resumed 12/2723 mg Monitor trends Diabetes mellitus type 2 We will stop Jardiance given frequent urinary  infections/candidiasis Continue sliding scale coverage May benefit from Amaryl or metformin in the outpatient setting after discussion with PCP Recent right shoulder arthroplasty [facility] Dr. Berenice Primas Postop management was supposed to be 01/11/2021 Appreciate nursing staff reaching out to figure out.  Instructions May need OT input with regards to rehab of this area- we will order the same Lactobacillus UTI?  Without features of sepsis Continue Rocephin-discontinue after today's date 4/27  repeat CBC a.m. OSA on BiPAP at night Discontinue oxygen during daytime and placed on BiPAP but falls asleep Monitor trends Hypokalemia from prior to admission Replaced with oral potassium-recheck labs a.m. Colon adenoma status post polypectomy Prior Streptococcus mitis bacteremia Needs outpatient surveillance of the same    DVT prophylaxis: SCD Code Status: Full Family Communication: Discussed in detail with husband at the bedside he is available at [place], [place] [phone]  [phone]    Disposition:  Status is: Inpatient  Remains inpatient appropriate because:Hemodynamically unstable, Altered mental status, Ongoing diagnostic testing needed not appropriate for outpatient work up and Unsafe d/c plan   Dispo: The patient is from: Home              Anticipated d/c is to: Home              Patient currently is not medically stable to d/c.   Difficult to place patient No       Consultants:   None currently  Procedures: No  Antimicrobials: Rocephin through 4/27   Subjective: Doing fair slightly sleepy but orientable no chest pain no fever Lower extremities slightly swollen  Objective: Vitals:   01/13/21 0300 01/13/21 0600 01/13/21 0756 01/13/21 1144  BP: 123/72 126/62 (!) 124/59 105/76  Pulse: 87 95 94 (!) 108  Resp: 16 14 18 19   Temp: 98.5 F (36.9 C)  97.8 F (36.6 C) (!) 97.5 F (36.4 C)  TempSrc:  Oral  Oral Oral  SpO2: 93% 90% 94% 90%  Weight:  (!)  167.6 kg    Height:        Intake/Output Summary (Last 24 hours) at 01/13/2021 1445 Last data filed at 01/13/2021 1102 Gross per 24 hour  Intake 818.89 ml  Output 2000 ml  Net -1181.11 ml   Filed Weights   01/11/21 0300 01/12/21 0300 01/13/21 0600  Weight: (!) 171 kg (!) 168.5 kg (!) 167.6 kg    Examination: EOMI NCAT no icterus no pallor thick neck Mallampati 4 Smile symmetric Chest clear no rales no rhonchi Abdomen obese nontender nondistended Mild lower extremity edema ROM intact Chronic venous stasis changes to lower extremity    Data Reviewed: personally reviewed   CBC    Component Value Date/Time   WBC 5.5 01/13/2021 0717   RBC 3.86 (L) 01/13/2021 0717   HGB 10.9 (L) 01/13/2021 0717   HGB 10.9 (L) 07/14/2020 1315   HGB 12.6 08/25/2017 1418   HCT 34.5 (L) 01/13/2021 0717   HCT 37.3 08/25/2017 1418   PLT PLATELET CLUMPS NOTED ON SMEAR, UNABLE TO ESTIMATE 01/13/2021 0717   PLT 32 (L) 07/14/2020 1315   PLT 66 (L) 08/25/2017 1418   MCV 89.4 01/13/2021 0717   MCV 91.2 08/25/2017 1418   MCH 28.2 01/13/2021 0717   MCHC 31.6 01/13/2021 0717   RDW 19.9 (H) 01/13/2021 0717   RDW 14.8 (H) 08/25/2017 1418   LYMPHSABS 0.5 (L) 01/10/2021 0017   LYMPHSABS 0.7 (L) 08/25/2017 1418   MONOABS 0.5 01/10/2021 0017   MONOABS 0.3 08/25/2017 1418   EOSABS 0.1 01/10/2021 0017   EOSABS 0.1 08/25/2017 1418   BASOSABS 0.0 01/10/2021 0017   BASOSABS 0.0 08/25/2017 1418   CMP Latest Ref Rng & Units 01/13/2021 01/12/2021 01/11/2021  Glucose 70 - 99 mg/dL 132(H) 134(H) 118(H)  BUN 8 - 23 mg/dL 19 20 19   Creatinine 0.44 - 1.00 mg/dL 0.77 0.83 0.84  Sodium 135 - 145 mmol/L 137 140 142  Potassium 3.5 - 5.1 mmol/L 3.7 3.3(L) 4.9  Chloride 98 - 111 mmol/L 101 103 106  CO2 22 - 32 mmol/L 31 28 25   Calcium 8.9 - 10.3 mg/dL 8.5(L) 8.5(L) 8.8(L)  Total Protein 6.5 - 8.1 g/dL 5.9(L) 6.0(L) 6.4(L)  Total Bilirubin 0.3 - 1.2 mg/dL 5.0(H) 3.6(H) 4.9(H)  Alkaline Phos 38 - 126 U/L 56 54 61   AST 15 - 41 U/L 27 30 34  ALT 0 - 44 U/L 15 15 14      Radiology Studies: No results found.   Scheduled Meds: . diltiazem  360 mg Oral Daily  . empagliflozin  25 mg Oral QPC breakfast  . furosemide  60 mg Intravenous BID  . insulin aspart  0-9 Units Subcutaneous Q4H  . lactulose  20 g Oral BID  . lidocaine  1 patch Transdermal Q24H  . metoprolol tartrate  50 mg Oral BID  . potassium chloride  40 mEq Oral BID  . spironolactone  25 mg Oral Daily   Continuous Infusions: . cefTRIAXone (ROCEPHIN)  IV 1 g (01/12/21 2150)     LOS: 3 days   Time spent: [place], MD [facility] To contact the attending provider between 7A-7P or the covering provider during after hours 7P-7A, please log into the web site [email or web address] and access using universal [place] password for that web site. If you do not have the password, please call the hospital operator.  01/13/2021, 2:45 PM

## 2021-01-13 NOTE — Progress Notes (Signed)
Heart Failure Nurse Navigator Progress Note  Mini interview conducted to assess for HV TOC needs. Pt does not meet HV TOC requirements-- admission in setting of AF RVR.  Pt did state she gets frequent UTI and currently has yeast in her abd/groin folds. Pt restarted Jardiance this AM, was home medication prior to admission by Dr. Buddy Duty (per pt statement). Notified hospitalist.   Navigator available for reassessment of patient.   Pricilla Holm, RN, BSN Heart Failure Nurse Navigator [phone]

## 2021-01-13 NOTE — Progress Notes (Signed)
Patient missed after surgery on Rt. shoulder follow up with Dr. Berenice Primas who is orthopedic at [place]. Shoulder dressing - two steri strips and blow one steri stip with some ecchymosis, no stitches. Called Dr. Berenice Primas office and talked with nurse regarding how to take care of shoulder dressing. Nurse stated that there was note to dry dressing on POD #3. Continue to keep dry dressing. HS [employer]

## 2021-01-13 NOTE — Progress Notes (Signed)
Physical Therapy Treatment Patient Details Name: Hannah Khan MRN: [id number] DOB: [birth year] Today's Date: 01/13/2021    History of Present Illness Pt is a 65 y.o. female with recent R shoulder sx (12/30/20), now admitted 01/10/21 with AMS, somnolence. Workup for acute hypoxic respiratory failure, acute metabolic encephalopathy; potential polypharmacy. Head CT negative for acute findings. UA concerning for UTI. PMH includes afib, DM2, OSA, cirrhosis, obesity.    PT Comments    Pt making gradual progress. She ambulated 12'x2 with min guard and mod A for transfers. Pt required cues for transfers. She did fatigue very easily and was fearful of falling.  Pt reports having difficulty at home since shoulder surgery and spouse unable to physically assist.  Continue to recommend SNF at d/c due to risk of falling, level of assist required, and not ambulating household distances.     Follow Up Recommendations  SNF;Supervision for mobility/OOB     Equipment Recommendations  None recommended by PT    Recommendations for Other Services       Precautions / Restrictions Precautions Precautions: Fall Restrictions RUE Weight Bearing: Partial weight bearing Other Position/Activity Restrictions: Per patient report - can be partial weight bearing, but not full weight bearing yet; no ROM restrictions that she knows of    Mobility  Bed Mobility Overal bed mobility: Needs Assistance Bed Mobility: Rolling;Sidelying to Sit Rolling: Mod assist Sidelying to sit: Mod assist;HOB elevated       General bed mobility comments: increased time, log roll technique with mod A to lift trunk, back hurting with supine sit so transitioned to log roll to L side    Transfers Overall transfer level: Needs assistance Equipment used: Rolling walker (2 wheeled) Transfers: Sit to/from Stand Sit to Stand: From elevated surface;Min guard         General transfer comment: Min guard for safety; performed x 3;  verbal cues to minimize WB through R UE ; poor eccentric control  Ambulation/Gait Ambulation/Gait assistance: Min assist Gait Distance (Feet): 12 Feet (12'x2) Assistive device: Rolling walker (2 wheeled) Gait Pattern/deviations: Step-to pattern;Wide base of support;Trunk flexed Gait velocity: Decreased   General Gait Details: Slow steps, cues for posture, RW proximity, and limited weight in R UE; Pt fatigued easily requiring rest breaks   Stairs             Wheelchair Mobility    Modified Rankin (Stroke Patients Only)       Balance Overall balance assessment: Needs assistance Sitting-balance support: No upper extremity supported Sitting balance-Leahy Scale: Fair Sitting balance - Comments: Sat EOB for at least 15 mins during session during rest breaks   Standing balance support: Bilateral upper extremity supported;No upper extremity supported Standing balance-Leahy Scale: Fair Standing balance comment: Can static stand without UE support, static and dynamic stability improved with UE support                            Cognition Arousal/Alertness: Awake/alert Behavior During Therapy: WFL for tasks assessed/performed Overall Cognitive Status: Within Functional Limits for tasks assessed                                        Exercises General Exercises - Lower Extremity Ankle Circles/Pumps: AROM;Both;10 reps;Supine Quad Sets: AROM;Both;10 reps;Supine Long Arc Quad: AROM;Both;10 reps;Seated Heel Slides: AAROM;Both;10 reps;Supine    General Comments General comments (skin integrity,  edema, etc.): Pt on RA. O2 sats 91% resting, 88% activity, quickly returns to 90%. DOE of 3/4 with walking. Pt expressed fear of falling and easily fatigued. Required frequent rest breaks. Discussed continued recommendation for SNF at d/c to progress mobility as well as they could address R UE. Educated that recommending SNF due to risk of falling, limited gait  distance, requiring mod A transfers, easily fatigued, was having a hard time at home since surgery, and spouse has his own mobility deficits and decreased ability to assist. Discussed SNF referral process. Pt asking about exercises while in room. Encouraged LE exercises that were performed during session. In regards to R UE, pt not sure of precautions - states none other than PWB that she is aware of. Advised to have spouse check surgical paperwork at home prior to OT eval tomorrow. Also encouraged hand/elbow movement but limited shoulder until precautions clarified.      Pertinent Vitals/Pain Pain Assessment: Faces Faces Pain Scale: Hurts even more Pain Location: Bil hips with initial transfer Pain Descriptors / Indicators: Discomfort;Grimacing Pain Intervention(s): Limited activity within patient's tolerance;Monitored during session;Repositioned (AAROM to loosen, log roll technique)    Home Living                      Prior Function            PT Goals (current goals can now be found in the care plan section) Acute Rehab PT Goals Patient Stated Goal: Hopeful for return home, but would like more information regarding SNF PT Goal Formulation: With patient/family Time For Goal Achievement: 01/26/21 Potential to Achieve Goals: Good Progress towards PT goals: Progressing toward goals    Frequency    Min 3X/week      PT Plan Current plan remains appropriate    Co-evaluation              AM-PAC PT "6 Clicks" Mobility   Outcome Measure  Help needed turning from your back to your side while in a flat bed without using bedrails?: A Little Help needed moving from lying on your back to sitting on the side of a flat bed without using bedrails?: A Lot Help needed moving to and from a bed to a chair (including a wheelchair)?: A Little Help needed standing up from a chair using your arms (e.g., wheelchair or bedside chair)?: A Little Help needed to walk in hospital room?:  A Little Help needed climbing 3-5 steps with a railing? : A Lot 6 Click Score: 16    End of Session Equipment Utilized During Treatment: Gait belt Activity Tolerance: Patient tolerated treatment well Patient left: with call bell/phone within reach;with family/visitor present;in chair (pt following commands, family present) Nurse Communication: Mobility status PT Visit Diagnosis: Other abnormalities of gait and mobility (R26.89);Pain Pain - Right/Left: Right Pain - part of body: Shoulder     Time: 9021-1155 PT Time Calculation (min) (ACUTE ONLY): 40 min  Charges:  $Gait Training: 8-22 mins $Therapeutic Exercise: 8-22 mins $Therapeutic Activity: 8-22 mins                     Abran Richard, PT Acute Rehab Services Pager [phone] [facility] 01/13/2021, 5:18 PM

## 2021-01-13 NOTE — Hospital Course (Addendum)
  BUN/creatinine 13/0.8-->12/0.8 Potassium 3.4-->2.7 Chloride 96 Platelets 30  Hemoglobin 12.0, WBC 5.1  CBG 125-->132

## 2021-01-14 ENCOUNTER — Ambulatory Visit: Payer: BC Managed Care – PPO | Admitting: Oncology

## 2021-01-14 ENCOUNTER — Other Ambulatory Visit: Payer: BC Managed Care – PPO

## 2021-01-14 DIAGNOSIS — I4891 Unspecified atrial fibrillation: Secondary | ICD-10-CM | POA: Diagnosis not present

## 2021-01-14 LAB — COMPREHENSIVE METABOLIC PANEL
ALT: 15 U/L (ref 0–44)
AST: 27 U/L (ref 15–41)
Albumin: 2.7 g/dL — ABNORMAL LOW (ref 3.5–5.0)
Alkaline Phosphatase: 61 U/L (ref 38–126)
Anion gap: 6 (ref 5–15)
BUN: 17 mg/dL (ref 8–23)
CO2: 32 mmol/L (ref 22–32)
Calcium: 8.7 mg/dL — ABNORMAL LOW (ref 8.9–10.3)
Chloride: 98 mmol/L (ref 98–111)
Creatinine, Ser: 0.85 mg/dL (ref 0.44–1.00)
GFR, Estimated: >60 mL/min (ref >60)
Glucose, Bld: 136 mg/dL — ABNORMAL HIGH (ref 70–99)
Potassium: 3.6 mmol/L (ref 3.5–5.1)
Sodium: 136 mmol/L (ref 135–145)
Total Bilirubin: 5.4 mg/dL — ABNORMAL HIGH (ref 0.3–1.2)
Total Protein: 6 g/dL — ABNORMAL LOW (ref 6.5–8.1)

## 2021-01-14 LAB — GLUCOSE, CAPILLARY
Glucose-Capillary: 120 mg/dL — ABNORMAL HIGH (ref 70–99)
Glucose-Capillary: 147 mg/dL — ABNORMAL HIGH (ref 70–99)
Glucose-Capillary: 201 mg/dL — ABNORMAL HIGH (ref 70–99)
Glucose-Capillary: 160 mg/dL — ABNORMAL HIGH (ref 70–99)
Glucose-Capillary: 150 mg/dL — ABNORMAL HIGH (ref 70–99)

## 2021-01-14 MED ORDER — LACTULOSE 10 GM/15ML PO SOLN
20.0000 g | Freq: Every day | ORAL | Status: DC
  Administered 2021-01-15: 20 g via ORAL
  Filled 2021-01-14 (×2): qty 30

## 2021-01-14 MED ORDER — FUROSEMIDE 10 MG/ML IJ SOLN
80.0000 mg | Freq: Three times a day (TID) | INTRAMUSCULAR | Status: DC
  Administered 2021-01-14 – 2021-01-17 (×10): 80 mg via INTRAVENOUS
  Filled 2021-01-14 (×10): qty 8

## 2021-01-14 NOTE — Progress Notes (Signed)
PROGRESS NOTE   Hannah Khan  URK:[id number] DOB: Jan 18, 1956 DOA: 01/10/2021 PCP: Lujean Amel, MD  Brief Narrative:   54 white female BMI 84 Cryptogenic cirrhosis + TCP NIDDM since 2017 OSA not on CPAP Fibromyalgia/MSK pain Colon adenoma status post polypectomy HFpEF A. fib CHADS2 score >4-not watchman or DCCV candidate Prior admissions for severe sepsis secondary to cellulitis lower extremity Recurrent admissions for RVR Last admission 03/14/2020-->03/18/2020 Streptococcus mitis bacteremia  She had recent shoulder surgery Dr. Berenice Primas in [facility] 12/30/2020-presented to [facility] ED acute altered mental status--had been discharged on oxycodone/Flexeril and using her CPAP as needed Developed incontinent urine continued to be somnolent confused On arrival EMS found her A. fib RVR rates 130s hypertensive 160 CXR negative for disease CT head negative for intracranial abnormality CT chest = bilateral pleural effusions pulmonary edema Initial Rx Rocephin Narcan Cardizem Ammonia found to be 78  Hospital-Problem based course   A. fib RVR CHADS2 score >4 not a candidate for watchman or DCCV Continue Cardizem 360 CD, metoprolol 50 twice daily Monitor trends Toxic metabolic encephalopathy probably combination of NASH cirrhosis, hyperammonemia in addition to fibromyalgia Additionally recent prescription opiates, muscle relaxants from shoulder surgery Taper lactulose to once daily 20 mg Discontinued prior to admission opiates etc. etc. HFpEF Resume Aldactone, metoprolol 50 twice daily, Cardizem 360 as above Monitor trends of potassium carefully Per patient dryest weight is about 340 pounds-she is currently 365 but is losing volume slowly Increase Lasix 80 3 times daily IV-Home Bumex = 2 mg twice daily Stockings, feet above heart, continue diuresis Karlene Lineman cirrhosis Hyperammonemia Lactulose as above Continue Aldactone 25 mg Monitor trends Diabetes mellitus type 2 Cannot afford  Jardiance-resume metformin (has taken in the past without side effect) Continue sliding scale coverage Recent right shoulder arthroplasty [facility] Dr. Berenice Primas Postop management was supposed to be 01/11/2021 OT to evaluate and treat given recent right shoulder surgery Lactobacillus UTI?  Without features of sepsis Continue Rocephin-discontinue after today's date 4/27  repeat CBC a.m. OSA on BiPAP at night Discontinue oxygen during daytime and placed on BiPAP but falls asleep Monitor trends Hypokalemia from prior to admission Replaced with oral potassium-recheck labs a.m. awake alert no distress Colon adenoma status post polypectomy Prior Streptococcus mitis bacteremia Needs outpatient surveillance of the same    DVT prophylaxis: SCD Code Status: Full Family Communication: Discussed in detail with husband at the bedside he is available at [place], [place] [phone]  [phone]   Disposition:  Status is: Inpatient Remains inpatient appropriate because:Hemodynamically unstable, Altered mental status, Ongoing diagnostic testing needed not appropriate for outpatient work up and Unsafe d/c plan Dispo: The patient is from: Home              Anticipated d/c is to: Home              Patient currently is not medically stable to d/c.   Difficult to place patient No    Consultants:   None currently  Procedures: No  Antimicrobials: Rocephin through 4/27   Subjective: Awake alert no distress feeling fair No chest pain no fever no chills  Objective: Vitals:   01/13/21 2014 01/13/21 2320 01/14/21 0420 01/14/21 0738  BP: 129/66 102/65 120/62 (!) 124/58  Pulse: (!) 108 87 93 (!) 101  Resp: 17 18 18 14   Temp: 97.8 F (36.6 C) 97.8 F (36.6 C) 98 F (36.7 C) 97.9 F (36.6 C)  TempSrc: Oral Oral Oral Oral  SpO2: 93% 92% 94% 96%  Weight:   Marland Kitchen)  166.1 kg   Height:        Intake/Output Summary (Last 24 hours) at 01/14/2021 0907 Last data filed at 01/13/2021 2321 Gross  per 24 hour  Intake 440 ml  Output 1850 ml  Net -1410 ml   Filed Weights   01/12/21 0300 01/13/21 0600 01/14/21 0420  Weight: (!) 168.5 kg (!) 167.6 kg (!) 166.1 kg    Examination:  EOMI NCAT no focal deficit Mallampati 4 S1-S2 no murmur no rub no gallop RRR On monitors patient is in A. fib at times in the 120s but comes back down and is rate controlled Abdomen is obese with anasarca Lower extremities are swollen red and have RED Neuro intact-coherent in NAD no focal deficit   Data Reviewed: personally reviewed   CBC    Component Value Date/Time   WBC 5.5 01/13/2021 0717   RBC 3.86 (L) 01/13/2021 0717   HGB 10.9 (L) 01/13/2021 0717   HGB 10.9 (L) 07/14/2020 1315   HGB 12.6 08/25/2017 1418   HCT 34.5 (L) 01/13/2021 0717   HCT 37.3 08/25/2017 1418   PLT PLATELET CLUMPS NOTED ON SMEAR, UNABLE TO ESTIMATE 01/13/2021 0717   PLT 32 (L) 07/14/2020 1315   PLT 66 (L) 08/25/2017 1418   MCV 89.4 01/13/2021 0717   MCV 91.2 08/25/2017 1418   MCH 28.2 01/13/2021 0717   MCHC 31.6 01/13/2021 0717   RDW 19.9 (H) 01/13/2021 0717   RDW 14.8 (H) 08/25/2017 1418   LYMPHSABS 0.5 (L) 01/10/2021 0017   LYMPHSABS 0.7 (L) 08/25/2017 1418   MONOABS 0.5 01/10/2021 0017   MONOABS 0.3 08/25/2017 1418   EOSABS 0.1 01/10/2021 0017   EOSABS 0.1 08/25/2017 1418   BASOSABS 0.0 01/10/2021 0017   BASOSABS 0.0 08/25/2017 1418   CMP Latest Ref Rng & Units 01/14/2021 01/13/2021 01/12/2021  Glucose 70 - 99 mg/dL 136(H) 132(H) 134(H)  BUN 8 - 23 mg/dL 17 19 20   Creatinine 0.44 - 1.00 mg/dL 0.85 0.77 0.83  Sodium 135 - 145 mmol/L 136 137 140  Potassium 3.5 - 5.1 mmol/L 3.6 3.7 3.3(L)  Chloride 98 - 111 mmol/L 98 101 103  CO2 22 - 32 mmol/L 32 31 28  Calcium 8.9 - 10.3 mg/dL 8.7(L) 8.5(L) 8.5(L)  Total Protein 6.5 - 8.1 g/dL 6.0(L) 5.9(L) 6.0(L)  Total Bilirubin 0.3 - 1.2 mg/dL 5.4(H) 5.0(H) 3.6(H)  Alkaline Phos 38 - 126 U/L 61 56 54  AST 15 - 41 U/L 27 27 30   ALT 0 - 44 U/L 15 15 15    Radiology  Studies: No results found.  Scheduled Meds: . diltiazem  360 mg Oral Daily  . furosemide  80 mg Intravenous Q8H  . insulin aspart  0-9 Units Subcutaneous Q4H  . lactulose  20 g Oral BID  . lidocaine  1 patch Transdermal Q24H  . metFORMIN  500 mg Oral Q breakfast  . metoprolol tartrate  50 mg Oral BID  . spironolactone  25 mg Oral Daily   Continuous Infusions:   LOS: 4 days   Time spent: [place], MD [facility] To contact the attending provider between 7A-7P or the covering provider during after hours 7P-7A, please log into the web site [email or web address] and access using universal [place] password for that web site. If you do not have the password, please call the hospital operator.  01/14/2021, 9:07 AM

## 2021-01-14 NOTE — NC FL2 (Signed)
[place] LEVEL OF CARE SCREENING TOOL     IDENTIFICATION  Patient Name: Hannah Khan Birthdate: 1955-10-26 Sex: female Admission Date (Current Location): 01/10/2021  [facility] and [place] Number:  Herbalist and Address:  [facility]. [facility], [place] [address], [place], [place] [address]      Provider Number: [id number]  Attending Physician Name and Address:  Nita Sells, MD  Relative Name and Phone Number:  Lynel, Forester [facility])   [phone]    Current Level of Care: Hospital Recommended Level of Care: [place] Prior Approval Number:    Date Approved/Denied:   PASRR Number: Pending  Discharge Plan: SNF    Current Diagnoses: Patient Active Problem List   Diagnosis Date Noted  . Atrial fibrillation with RVR ([place]) 01/10/2021  . Acute encephalopathy 01/10/2021  . Secondary hypercoagulable state ([place]) 06/23/2020  . Streptococcal bacteremia 03/16/2020  . Chronic venous stasis dermatitis of both lower extremities 03/16/2020  . A-fib ([place]) 03/15/2020  . Acute lower UTI 04/26/2018  . Liver cirrhosis secondary to NASH (nonalcoholic steatohepatitis) ([place]) 04/26/2018  . Hyperlipidemia 04/26/2018  . Type 2 diabetes mellitus with other specified complication ([place])   . Cellulitis of right lower extremity 03/15/2018  . Severe sepsis ([place]) 03/14/2018  . Acute respiratory failure with hypoxia ([place]) 11/01/2016  . Community acquired pneumonia of right lower lobe of lung 10/31/2016  . Muscular abdominal pain in right upper quadrant 07/11/2016  . Acute on chronic diastolic CHF (congestive heart failure) ([place]) 07/10/2016  . Thrombocytopenia ([place]) 07/10/2016  . Permanent atrial fibrillation ([place]) 01/30/2016  . Type 2 diabetes mellitus without complication, without long-term current use of insulin ([place]) 01/30/2016  . Acute on chronic combined systolic and diastolic CHF (congestive heart failure) ([place]) 01/26/2016  . Morbid  obesity- 12/10/2015  . Persistent atrial fibrillation ([place]) 12/08/2015  . Melena 01/31/2015  . Aortic heart murmur 01/31/2015  . Pancytopenia ([place]) 07/23/2013  . Obstructive sleep apnea 08/07/2007  . Essential hypertension 08/07/2007  . ALLERGIC RHINITIS 08/07/2007    Orientation RESPIRATION BLADDER Height & Weight     Self,Time,Situation,Place  O2 (2L Nasal Cannula) Continent Weight: (!) 366 lb 2.9 oz (166.1 kg) Height:  5' 7"  (170.2 cm)  BEHAVIORAL SYMPTOMS/MOOD NEUROLOGICAL BOWEL NUTRITION STATUS      Continent Diet (See DC summary)  AMBULATORY STATUS COMMUNICATION OF NEEDS Skin   Extensive Assist Verbally Surgical wounds (Incision R shoulder)                       Personal Care Assistance Level of Assistance  Bathing,Feeding,Dressing Bathing Assistance: Maximum assistance Feeding assistance: Independent Dressing Assistance: Maximum assistance     Functional Limitations Info  Sight,Speech,Hearing Sight Info: Impaired Hearing Info: Adequate Speech Info: Adequate    SPECIAL CARE FACTORS FREQUENCY  PT (By licensed PT),OT (By licensed OT)     PT Frequency: 5x a week OT Frequency: 5x a week            Contractures Contractures Info: Not present    Additional Factors Info  Code Status,Allergies Code Status Info: Full Allergies Info: Celecoxib   Shellfish Allergy   Barium-containing Compounds   Prednisone   Statins   Fentanyl   Ibuprofen   Tramadol   Tylenol (Acetaminophen)           Current Medications (01/14/2021):  This is the current hospital active medication list Current Facility-Administered Medications  Medication Dose Route Frequency Provider Last Rate Last Admin  .  diltiazem (CARDIZEM CD) 24 hr capsule 360 mg  360 mg Oral Daily Vann, Jessica U, DO   360 mg at 01/14/21 0914  . furosemide (LASIX) injection 80 mg  80 mg Intravenous Q8H Samtani, Jai-Gurmukh, MD      . insulin aspart (novoLOG) injection 0-9 Units  0-9 Units Subcutaneous Q4H Opyd,  Ilene Qua, MD   3 Units at 01/14/21 1127  . [START ON 01/15/2021] lactulose (CHRONULAC) 10 GM/15ML solution 20 g  20 g Oral Daily Samtani, Jai-Gurmukh, MD      . lidocaine (LIDODERM) 5 % 1 patch  1 patch Transdermal Q24H Shalhoub, Sherryll Burger, MD   1 patch at 01/14/21 0557  . metFORMIN (GLUCOPHAGE) tablet 500 mg  500 mg Oral Q breakfast Nita Sells, MD   500 mg at 01/14/21 0553  . metoprolol tartrate (LOPRESSOR) tablet 50 mg  50 mg Oral BID Little Ishikawa, MD   50 mg at 01/14/21 0913  . ondansetron (ZOFRAN) tablet 4 mg  4 mg Oral Q6H PRN Opyd, Ilene Qua, MD      . spironolactone (ALDACTONE) tablet 25 mg  25 mg Oral Daily Nita Sells, MD   25 mg at 01/14/21 7989     Discharge Medications: Please see discharge summary for a list of discharge medications.  Relevant Imaging Results:  Relevant Lab Results:   Additional Information SSN [id number]  Reece Agar, LCSWA

## 2021-01-14 NOTE — Plan of Care (Signed)
  Problem: Clinical Measurements: Goal: Ability to maintain clinical measurements within normal limits will improve Outcome: Progressing Goal: Diagnostic test results will improve Outcome: Progressing Goal: Respiratory complications will improve Outcome: Progressing Goal: Cardiovascular complication will be avoided Outcome: Progressing   Problem: Activity: Goal: Risk for activity intolerance will decrease Outcome: Progressing   Problem: Nutrition: Goal: Adequate nutrition will be maintained Outcome: Progressing   Problem: Coping: Goal: Level of anxiety will decrease Outcome: Progressing   Problem: Elimination: Goal: Will not experience complications related to bowel motility Outcome: Progressing Goal: Will not experience complications related to urinary retention Outcome: Progressing

## 2021-01-14 NOTE — Evaluation (Signed)
Occupational Therapy Evaluation Patient Details Name: Hannah Khan MRN: [id number] DOB: 1956/02/06 Today's Date: 01/14/2021    History of Present Illness Pt is a 65 y.o. female with recent R shoulder sx (12/30/20), now admitted 01/10/21 with AMS, somnolence. Workup for acute hypoxic respiratory failure, acute metabolic encephalopathy; potential polypharmacy. Head CT negative for acute findings. UA concerning for UTI. PMH includes afib, DM2, OSA, cirrhosis, obesity.   Clinical Impression   Pt has been requiring a significant amount of assistance for ADL and was not mobilizing much since her R shoulder surgery (biceps tendon repair?) on 12/30/20. Pt presents with weakness, decreased balance and impaired R UE use. Pt requires set up to total assist for ADL and stood and transferred with min assist. Pt's husband is supportive, but has his own physical limitations. Recommending ST rehab in SNF prior to returning home. Will follow acutely.     Follow Up Recommendations  SNF;Supervision/Assistance - 24 hour    Equipment Recommendations  None recommended by OT    Recommendations for Other Services       Precautions / Restrictions Precautions Precautions: Fall Restrictions Weight Bearing Restrictions: Yes RUE Weight Bearing: Partial weight bearing Other Position/Activity Restrictions: Per patient report - can be partial weight bearing, but not full weight bearing yet; no ROM restrictions that she is aware, has a paper at home with exercises from Dr. Berenice Prim[place]      Mobility Bed Mobility               General bed mobility comments: received and returned to chair    Transfers Overall transfer level: Needs assistance Equipment used: Rolling walker (2 wheeled) Transfers: Sit to/from Stand Sit to Stand: Min assist         General transfer comment: very minimal assist to stand from recliner, verbal cues to minimize weight through R UE    Balance Overall balance assessment:  Needs assistance   Sitting balance-Leahy Scale: Fair     Standing balance support: Bilateral upper extremity supported;No upper extremity supported Standing balance-Leahy Scale: Poor Standing balance comment: reliant on RW                           ADL either performed or assessed with clinical judgement   ADL Overall ADL's : Needs assistance/impaired Eating/Feeding: Sitting;Independent   Grooming: Set up;Sitting   Upper Body Bathing: Minimal assistance;Sitting   Lower Body Bathing: Total assistance;Sit to/from stand   Upper Body Dressing : Minimal assistance;Sitting   Lower Body Dressing: Total assistance;Sit to/from stand   Toilet Transfer: Minimal assistance;Ambulation;RW;BSC   Toileting- Clothing Manipulation and Hygiene: Total assistance;Sit to/from stand Toileting - Clothing Manipulation Details (indicate cue type and reason): pt with urinary incontinence             Vision Baseline Vision/History: Wears glasses Wears Glasses: At all times Patient Visual Report: No change from baseline       Perception     Praxis      Pertinent Vitals/Pain Pain Assessment: Faces Faces Pain Scale: Hurts a little bit Pain Location: buttocks Pain Descriptors / Indicators: Discomfort Pain Intervention(s): Repositioned;Other (comment) (pericare)     Hand Dominance Right   Extremity/Trunk Assessment Upper Extremity Assessment Upper Extremity Assessment: RUE deficits/detail;LUE deficits/detail RUE Deficits / Details: FF and ABD to 45 degrees actively, full AROM elbow to hand, did not further assess shoulder, will await husband to bring in HEP LUE Deficits / Details: generalized weakness, can reach  the top of her head   Lower Extremity Assessment Lower Extremity Assessment: Defer to PT evaluation   Cervical / Trunk Assessment Cervical / Trunk Assessment: Other exceptions Cervical / Trunk Exceptions: obesity   Communication Communication Communication: No  difficulties   Cognition Arousal/Alertness: Awake/alert Behavior During Therapy: WFL for tasks assessed/performed Overall Cognitive Status: Within Functional Limits for tasks assessed                                 General Comments: requires redirection for attention to topic   General Comments       Exercises     Shoulder Instructions      Home Living Family/patient expects to be discharged to:: Private residence Living Arrangements: Spouse/significant other Available Help at Discharge: Family;Available 24 hours/day Type of Home: House Home Access: Stairs to enter [employer] of Steps: 1 small step Entrance Stairs-Rails: None Home Layout: One level     Bathroom Shower/Tub: Teacher, early years/pre: Standard     Home Equipment: Environmental consultant - 4 wheels;Cane - single point;Tub bench;Bedside commode;Adaptive equipment Adaptive Equipment: Other (Comment) (toilet aid) Additional Comments: husband can offer minimal physical assist      Prior Functioning/Environment Level of Independence: Needs assistance  Gait / Transfers Assistance Needed: Since recent shoulder sx, reports "furniture surfing" for ambulation inside; uses rollator outside. Husband assist with transfer into tub. Has been sleeping in recliner ADL's / Homemaking Assistance Needed: Since recent shoulder sx, husband assists with dressing, toileting, bathing; husband performs all household tasks            OT Problem List: Decreased strength;Decreased range of motion;Decreased activity tolerance;Impaired balance (sitting and/or standing);Decreased knowledge of use of DME or AE;Obesity;Impaired UE functional use;Pain      OT Treatment/Interventions: Self-care/ADL training;Therapeutic exercise;DME and/or AE instruction;Therapeutic activities;Patient/family education;Balance training    OT Goals(Current goals can be found in the care plan section) Acute Rehab OT Goals Patient Stated  Goal: pt and husband agree ST rehab is optimal OT Goal Formulation: With patient/family Time For Goal Achievement: 01/28/21 Potential to Achieve Goals: Good ADL Goals Pt Will Perform Grooming: with min guard assist;standing (at least 2 activities) Pt Will Perform Upper Body Bathing: with adaptive equipment;sitting;with supervision Pt Will Perform Lower Body Bathing: with supervision;with adaptive equipment;sit to/from stand Pt Will Perform Upper Body Dressing: with set-up;sitting Pt Will Perform Lower Body Dressing: with mod assist;sit to/from stand Pt Will Transfer to Toilet: with supervision;ambulating;bedside commode (over toilet) Pt Will Perform Toileting - Clothing Manipulation and hygiene: with supervision;with adaptive equipment;sit to/from stand  OT Frequency: Min 2X/week   Barriers to D/C: Decreased caregiver support          Co-evaluation              AM-PAC OT "6 Clicks" Daily Activity     Outcome Measure Help from another person eating meals?: None Help from another person taking care of personal grooming?: A Little Help from another person toileting, which includes using toliet, bedpan, or urinal?: Total Help from another person bathing (including washing, rinsing, drying)?: A Lot Help from another person to put on and taking off regular upper body clothing?: A Little Help from another person to put on and taking off regular lower body clothing?: Total 6 Click Score: 14   End of Session Equipment Utilized During Treatment: Rolling walker  Activity Tolerance: Patient tolerated treatment well Patient left: in chair;with call bell/phone within  reach  OT Visit Diagnosis: Unsteadiness on feet (R26.81);Other abnormalities of gait and mobility (R26.89);Pain;Muscle weakness (generalized) (M62.81)                Time: 1121-6244 OT Time Calculation (min): 48 min Charges:  OT General Charges $OT Visit: 1 Visit OT Evaluation $OT Eval Moderate Complexity: 1 Mod OT  Treatments $Self Care/Home Management : 23-37 mins  Nestor Lewandowsky, OTR/L Acute Rehabilitation Services Pager: [phone] Office: [phone]  Malka So 01/14/2021, 3:42 PM

## 2021-01-14 NOTE — TOC Initial Note (Addendum)
Transition of Care [facility]) - Initial/Assessment Note    Patient Details  Name: Hannah Khan MRN: [id number] Date of Birth: 1956-01-20  Transition of Care [facility]) CM/SW Contact:    Tresa Endo Phone Number: 01/14/2021, 10:22 AM  Clinical Narrative:                 10:00am- CSW spoke with pt about SNF and placement, pt wants to discuss going to SNF with husband first but is agreeable to being faxed out to facilities in or near [facility]. Pt explained that she will be using workers comp to pay for SNF if she goes and want to check with husband to see if she should do [place] first. CSW will fax pt out to requested areas and explained that if accepted CSW will contact pt first for workers comp information. Pt will follow up once her husband comes to visit her today.   3:20pm- CSW spoke with pt and her husband about SNF decision, they are agreeable to SNF and CSW has faxed pt out and is waiting on offers. Pt provided NCM for workers comp number to [place]. CSW will follow up. Expected Discharge Plan: Skilled Nursing Facility Barriers to Discharge: Continued Medical Work up   Patient Goals and CMS Choice Patient states their goals for this hospitalization and ongoing recovery are:: Rehab and Recovery CMS Medicare.gov Compare Post Acute Care list provided to:: Patient Choice offered to / list presented to : Patient  Expected Discharge Plan and Services Expected Discharge Plan: [place] In-house Referral: Clinical Social Work   Post Acute Care Choice: [place] Living arrangements for the past 2 months: [place]                                      Prior Living Arrangements/Services Living arrangements for the past 2 months: Single Family Home Lives with:: Spouse Patient language and need for interpreter reviewed:: No Do you feel safe going back to the place where you live?: Yes      Need for Family Participation in Patient Care: No  (Comment) Care giver support system in place?: Yes (comment)   Criminal Activity/Legal Involvement Pertinent to Current Situation/Hospitalization: No - Comment as needed  Activities of Daily Living      Permission Sought/Granted Permission sought to share information with : Family Supports Permission granted to share information with : Yes, Verbal Permission Granted  Share Information with NAME: Katiana, [place]     Permission granted to share info w Relationship: (Spouse)  Permission granted to share info w Contact Information: [phone]  Emotional Assessment Appearance:: Appears stated age Attitude/Demeanor/Rapport: Engaged Affect (typically observed): Appropriate Orientation: : Oriented to Self,Oriented to Place,Oriented to  Time,Oriented to Situation Alcohol / Substance Use: Not Applicable Psych Involvement: No (comment)  Admission diagnosis:  Acute pulmonary edema ([place]) [J81.0] Atrial fibrillation with rapid ventricular response ([place]) [I48.91] Atrial fibrillation with RVR ([place]) [I48.91] Urinary tract infection with hematuria, site unspecified [N39.0, R31.9] Altered mental status, unspecified altered mental status type [R41.82] Patient Active Problem List   Diagnosis Date Noted  . Atrial fibrillation with RVR ([place]) 01/10/2021  . Acute encephalopathy 01/10/2021  . Secondary hypercoagulable state ([place]) 06/23/2020  . Streptococcal bacteremia 03/16/2020  . Chronic venous stasis dermatitis of both lower extremities 03/16/2020  . A-fib ([place]) 03/15/2020  . Acute lower UTI 04/26/2018  . Liver cirrhosis secondary to NASH (nonalcoholic  steatohepatitis) ([place]) 04/26/2018  . Hyperlipidemia 04/26/2018  . Type 2 diabetes mellitus with other specified complication ([place])   . Cellulitis of right lower extremity 03/15/2018  . Severe sepsis ([place]) 03/14/2018  . Acute respiratory failure with hypoxia ([place]) 11/01/2016  . Community acquired pneumonia of right lower lobe of lung 10/31/2016   . Muscular abdominal pain in right upper quadrant 07/11/2016  . Acute on chronic diastolic CHF (congestive heart failure) ([place]) 07/10/2016  . Thrombocytopenia ([place]) 07/10/2016  . Permanent atrial fibrillation ([place]) 01/30/2016  . Type 2 diabetes mellitus without complication, without long-term current use of insulin ([place]) 01/30/2016  . Acute on chronic combined systolic and diastolic CHF (congestive heart failure) ([place]) 01/26/2016  . Morbid obesity- 12/10/2015  . Persistent atrial fibrillation ([place]) 12/08/2015  . Melena 01/31/2015  . Aortic heart murmur 01/31/2015  . Pancytopenia ([place]) 07/23/2013  . Obstructive sleep apnea 08/07/2007  . Essential hypertension 08/07/2007  . ALLERGIC RHINITIS 08/07/2007   PCP:  Lujean Amel, MD Pharmacy:   [id number] [place]Phone: [phone]: [address]Phone: [phone]: [phone]    Social Determinants of Health (SDOH) Interventions    Readmission Risk Interventions No flowsheet data found.

## 2021-01-14 NOTE — Plan of Care (Signed)

## 2021-01-15 DIAGNOSIS — I4891 Unspecified atrial fibrillation: Secondary | ICD-10-CM | POA: Diagnosis not present

## 2021-01-15 LAB — COMPREHENSIVE METABOLIC PANEL
ALT: 16 U/L (ref 0–44)
AST: 25 U/L (ref 15–41)
Albumin: 2.8 g/dL — ABNORMAL LOW (ref 3.5–5.0)
Alkaline Phosphatase: 57 U/L (ref 38–126)
Anion gap: 8 (ref 5–15)
BUN: 13 mg/dL (ref 8–23)
CO2: 32 mmol/L (ref 22–32)
Calcium: 8.9 mg/dL (ref 8.9–10.3)
Chloride: 97 mmol/L — ABNORMAL LOW (ref 98–111)
Creatinine, Ser: 0.81 mg/dL (ref 0.44–1.00)
GFR, Estimated: >60 mL/min (ref >60)
Glucose, Bld: 136 mg/dL — ABNORMAL HIGH (ref 70–99)
Potassium: 3.1 mmol/L — ABNORMAL LOW (ref 3.5–5.1)
Sodium: 137 mmol/L (ref 135–145)
Total Bilirubin: 4.3 mg/dL — ABNORMAL HIGH (ref 0.3–1.2)
Total Protein: 6.2 g/dL — ABNORMAL LOW (ref 6.5–8.1)

## 2021-01-15 LAB — GLUCOSE, CAPILLARY
Glucose-Capillary: 126 mg/dL — ABNORMAL HIGH (ref 70–99)
Glucose-Capillary: 131 mg/dL — ABNORMAL HIGH (ref 70–99)
Glucose-Capillary: 135 mg/dL — ABNORMAL HIGH (ref 70–99)
Glucose-Capillary: 145 mg/dL — ABNORMAL HIGH (ref 70–99)
Glucose-Capillary: 150 mg/dL — ABNORMAL HIGH (ref 70–99)
Glucose-Capillary: 156 mg/dL — ABNORMAL HIGH (ref 70–99)

## 2021-01-15 MED ORDER — POTASSIUM CHLORIDE CRYS ER 20 MEQ PO TBCR
40.0000 meq | EXTENDED_RELEASE_TABLET | Freq: Two times a day (BID) | ORAL | Status: DC
  Administered 2021-01-15 – 2021-01-16 (×3): 40 meq via ORAL
  Filled 2021-01-15 (×3): qty 2

## 2021-01-15 MED ORDER — LACTULOSE 10 GM/15ML PO SOLN
10.0000 g | ORAL | Status: AC
  Administered 2021-01-15: 10 g via ORAL
  Filled 2021-01-15: qty 15

## 2021-01-15 MED ORDER — LACTULOSE 10 GM/15ML PO SOLN
30.0000 g | Freq: Three times a day (TID) | ORAL | Status: DC
  Administered 2021-01-15 – 2021-01-16 (×3): 30 g via ORAL
  Filled 2021-01-15 (×3): qty 45

## 2021-01-15 NOTE — Progress Notes (Addendum)
PROGRESS NOTE   Hannah Khan  IPJ:[id number] DOB: 06/26/1956 DOA: 01/10/2021 PCP: Lujean Amel, MD  Brief Narrative:   59 white female BMI 76 Cryptogenic cirrhosis + TCP NIDDM since 2017 OSA not on CPAP Fibromyalgia/MSK pain Colon adenoma status post polypectomy HFpEF A. fib CHADS2 score >4-not watchman or DCCV candidate Prior admissions for severe sepsis secondary to cellulitis lower extremity Recurrent admissions for RVR Last admission 03/14/2020-->03/18/2020 Streptococcus mitis bacteremia  She had recent shoulder surgery Dr. Berenice Primas in [facility] 12/30/2020-presented to [facility] ED acute altered mental status--had been discharged on oxycodone/Flexeril and using her CPAP as needed Developed incontinent urine continued to be somnolent confused On arrival EMS found her A. fib RVR rates 130s hypertensive 160 CXR negative for disease CT head negative for intracranial abnormality CT chest = bilateral pleural effusions pulmonary edema Initial Rx Rocephin Narcan Cardizem Ammonia found to be 78  Started on lactulose-when the dosage was dropped patient became more encephalopathic We await further mental clarity prior to being able to discharge her  Hospital-Problem based course   A. fib RVR CHADS2 score >4 not a candidate for watchman or DCCV Continue Cardizem 360 CD, metoprolol 50 twice daily Monitor trends Toxic metabolic encephalopathy 2/2 hyperammonemia from cirrhosis Additionally recent prescription opiates, muscle relaxants from shoulder surgery Lactulose tapered 4/28 and patient has become more lethargic and unarousable-she now has a flap Have increased lactulose to 30 mg twice daily and expect she will improve HFpEF Resume Aldactone, metoprolol 50 twice daily, Cardizem 360 as above dryest weight is about 340 pounds 171-->160 KG Minus 9.445 L/since admit Lasix 80 3 times daily IV-Home Bumex = 2 mg twice daily \ Stockings, feet above heart, continue diuresis Karlene Lineman  cirrhosis Hyperammonemia Lactulose as above Continue Aldactone 25 mg Monitor trends Diabetes mellitus type 2 Cannot afford Jardiance-resume metformin (has taken in the past without side effect) Continue sliding scale coverage Recent right shoulder arthroplasty [facility] Dr. Berenice Primas Postop management was supposed to be 01/11/2021 OT to evaluate and treat given recent right shoulder surgery Lactobacillus UTI?  Without features of sepsis Continue Rocephin-discontinued 4/27-has no features of infection at this time repeat CBC a.m. OSA on BiPAP at night Discontinue oxygen during daytime and placed on BiPAP but falls asleep Monitor trends Hypokalemia from prior to admission Increased replacement of potassium today Colon adenoma status post polypectomy Prior Streptococcus mitis bacteremia Needs outpatient surveillance of the same    DVT prophylaxis: SCD Code Status: Full Family Communication: Discussed in detail with husband in addition to the patient's daughter who was at at the bedside earlier today Dymphna, [place] [phone]  [phone]   Disposition:  Status is: Inpatient Remains inpatient appropriate because:Hemodynamically unstable, Altered mental status, Ongoing diagnostic testing needed not appropriate for outpatient work up and Unsafe d/c plan Dispo: The patient is from: Home              Anticipated d/c is to: SNF in 2 to 3 days              Patient currently is not medically stable to d/c.   Difficult to place patient No    Consultants:   None currently  Procedures: No  Antimicrobials: Rocephin through 4/27   Subjective:  Confused this morning Rounded again on the patient 5:00 PM and patient remains confused and encephalopathic She is about to get her afternoon dose of lactulose  Objective: Vitals:   01/15/21 1200 01/15/21 1300 01/15/21 1400 01/15/21 1500  BP: (!) 116/51 (!) 104/49 (!) 97/55 120/60  Pulse: 96 84 87 92  Resp: 13 13 14 16   Temp:       TempSrc:      SpO2: 96% 95% 95% 90%  Weight:      Height:        Intake/Output Summary (Last 24 hours) at 01/15/2021 1711 Last data filed at 01/15/2021 1200 Gross per 24 hour  Intake 120 ml  Output 4000 ml  Net -3880 ml   Filed Weights   01/13/21 0600 01/14/21 0420 01/15/21 0528  Weight: (!) 167.6 kg (!) 166.1 kg (!) 160.8 kg    Examination:  Confused arousable cannot orient to time place person Neck soft supple Thick neck Mallampati 4 Abdomen soft nontender no rebound no guarding Flap present Somnolent at times   Data Reviewed: personally reviewed   CBC    Component Value Date/Time   WBC 5.5 01/13/2021 0717   RBC 3.86 (L) 01/13/2021 0717   HGB 10.9 (L) 01/13/2021 0717   HGB 10.9 (L) 07/14/2020 1315   HGB 12.6 08/25/2017 1418   HCT 34.5 (L) 01/13/2021 0717   HCT 37.3 08/25/2017 1418   PLT PLATELET CLUMPS NOTED ON SMEAR, UNABLE TO ESTIMATE 01/13/2021 0717   PLT 32 (L) 07/14/2020 1315   PLT 66 (L) 08/25/2017 1418   MCV 89.4 01/13/2021 0717   MCV 91.2 08/25/2017 1418   MCH 28.2 01/13/2021 0717   MCHC 31.6 01/13/2021 0717   RDW 19.9 (H) 01/13/2021 0717   RDW 14.8 (H) 08/25/2017 1418   LYMPHSABS 0.5 (L) 01/10/2021 0017   LYMPHSABS 0.7 (L) 08/25/2017 1418   MONOABS 0.5 01/10/2021 0017   MONOABS 0.3 08/25/2017 1418   EOSABS 0.1 01/10/2021 0017   EOSABS 0.1 08/25/2017 1418   BASOSABS 0.0 01/10/2021 0017   BASOSABS 0.0 08/25/2017 1418   CMP Latest Ref Rng & Units 01/15/2021 01/14/2021 01/13/2021  Glucose 70 - 99 mg/dL 136(H) 136(H) 132(H)  BUN 8 - 23 mg/dL 13 17 19   Creatinine 0.44 - 1.00 mg/dL 0.81 0.85 0.77  Sodium 135 - 145 mmol/L 137 136 137  Potassium 3.5 - 5.1 mmol/L 3.1(L) 3.6 3.7  Chloride 98 - 111 mmol/L 97(L) 98 101  CO2 22 - 32 mmol/L 32 32 31  Calcium 8.9 - 10.3 mg/dL 8.9 8.7(L) 8.5(L)  Total Protein 6.5 - 8.1 g/dL 6.2(L) 6.0(L) 5.9(L)  Total Bilirubin 0.3 - 1.2 mg/dL 4.3(H) 5.4(H) 5.0(H)  Alkaline Phos 38 - 126 U/L 57 61 56  AST 15 - 41 U/L 25  27 27   ALT 0 - 44 U/L 16 15 15    Radiology Studies: No results found.  Scheduled Meds: . diltiazem  360 mg Oral Daily  . furosemide  80 mg Intravenous Q8H  . insulin aspart  0-9 Units Subcutaneous Q4H  . lactulose  30 g Oral TID  . lidocaine  1 patch Transdermal Q24H  . metFORMIN  500 mg Oral Q breakfast  . metoprolol tartrate  50 mg Oral BID  . potassium chloride  40 mEq Oral BID  . spironolactone  25 mg Oral Daily   Continuous Infusions:   LOS: 5 days   Time spent: [place], MD [facility] To contact the attending provider between 7A-7P or the covering provider during after hours 7P-7A, please log into the web site [email or web address] and access using universal [place] password for that web site. If you do not have the password, please call the hospital operator.  01/15/2021, 5:11 PM

## 2021-01-15 NOTE — Progress Notes (Signed)
Physical Therapy Treatment Patient Details Name: Hannah Khan MRN: [id number] DOB: 1956/04/10 Today's Date: 01/15/2021    History of Present Illness Pt is a 65 y.o. female with recent R shoulder sx (12/30/20), now admitted 01/10/21 with AMS, somnolence. Workup for acute hypoxic respiratory failure, acute metabolic encephalopathy; potential polypharmacy. Head CT negative for acute findings. UA concerning for UTI. PMH includes afib, DM2, OSA, cirrhosis, obesity.    PT Comments    Attempted to mobilize pt but pt very lethargic. Could arouse but pt unable to consistently follow commands to get to edge of bed on several attempts. Requested to use the bathroom. Nurse in to assist. Again unable to safely mobilize. Will continue to follow for mobility. As lethargy improves mobility should progress.  Continue to recommend ST-SNF.   Follow Up Recommendations  SNF;Supervision for mobility/OOB     Equipment Recommendations  None recommended by PT    Recommendations for Other Services       Precautions / Restrictions Precautions Precautions: Fall Restrictions Other Position/Activity Restrictions: Per patient report - can be partial weight bearing, but not full weight bearing yet; no ROM restrictions that she is aware, has a paper at home with exercises from [place] Berenice Primas [place]    Mobility  Bed Mobility Overal bed mobility: Needs Assistance Bed Mobility: Rolling;Sidelying to Sit Rolling: +2 for physical assistance;Total assist         General bed mobility comments: Attempted to come to sitting EOB but unable with 1 person assist due to lethargy and slow processing    Transfers                 General transfer comment: Unable to attempt  Ambulation/Gait             General Gait Details: Unable to attempt   Stairs             Wheelchair Mobility    Modified Rankin (Stroke Patients Only)       Balance                                             Cognition Arousal/Alertness: Lethargic Behavior During Therapy: WFL for tasks assessed/performed Overall Cognitive Status: Impaired/Different from baseline Area of Impairment: Orientation;Attention;Memory;Following commands;Problem solving                 Orientation Level: Disoriented to;Time Current Attention Level: Sustained Memory: Decreased short-term memory Following Commands: Follows one step commands inconsistently;Follows one step commands with increased time     Problem Solving: Slow processing;Decreased initiation;Requires verbal cues;Requires tactile cues;Difficulty sequencing General Comments: Likely due to lethargy and incr ammonia levels      Exercises      General Comments        Pertinent Vitals/Pain Pain Assessment: Faces Faces Pain Scale: No hurt    Home Living                      Prior Function            PT Goals (current goals can now be found in the care plan section) Progress towards PT goals: Not progressing toward goals - comment (lethargy)    Frequency    Min 3X/week      PT Plan Current plan remains appropriate    Co-evaluation  AM-PAC PT "6 Clicks" Mobility   Outcome Measure  Help needed turning from your back to your side while in a flat bed without using bedrails?: Total Help needed moving from lying on your back to sitting on the side of a flat bed without using bedrails?: Total Help needed moving to and from a bed to a chair (including a wheelchair)?: Total Help needed standing up from a chair using your arms (e.g., wheelchair or bedside chair)?: Total Help needed to walk in hospital room?: Total Help needed climbing 3-5 steps with a railing? : Total 6 Click Score: 6    End of Session   Activity Tolerance: Patient limited by lethargy Patient left: with call bell/phone within reach;in bed;with bed alarm set (pt following commands, family present) Nurse Communication: Mobility  status (nurse present) PT Visit Diagnosis: Other abnormalities of gait and mobility (R26.89);Pain Pain - Right/Left: Right Pain - part of body: Shoulder     Time: 0156-1537 PT Time Calculation (min) (ACUTE ONLY): 22 min  Charges:  $Therapeutic Activity: 8-22 mins                     [place] Pager [phone] Office Flas[place] 01/15/2021, 4:36 PM

## 2021-01-15 NOTE — Plan of Care (Signed)
  Problem: Clinical Measurements: Goal: Will remain free from infection Outcome: Progressing Goal: Respiratory complications will improve Outcome: Progressing Goal: Cardiovascular complication will be avoided Outcome: Progressing   Problem: Nutrition: Goal: Adequate nutrition will be maintained Outcome: Progressing   Problem: Elimination: Goal: Will not experience complications related to urinary retention Outcome: Progressing

## 2021-01-15 NOTE — TOC Progression Note (Signed)
Transition of Care [facility]) - Progression Note    Patient Details  Name: Hannah Khan MRN: [id number] Date of Birth: 13-Dec-1955  Transition of [facility]) CM/SW Contact  Reece Agar, [place] Phone Number: 01/15/2021, 3:59 PM  Clinical Narrative:     9:49am- CSW spoke with Wallie Char NCM from workers comp. Olivia Mackie explained that pt was not admitted for injuries pertainig to workers comp and will not be able to file for workers comp. She went ahead and sent out pt information to attorney just to confirm that pt would not be able to file for SNF under workers comp. Olivia Mackie can be reached at [phone]. Pt SNF options are all pending referrals and may need to broaden her options to get placement. CSW will follow up.   Expected Discharge Plan: [place] Barriers to Discharge: Continued Medical Work up  Expected Discharge Plan and Services Expected Discharge Plan: [place] In-house Referral: Clinical Social Work   Post Acute Care Choice: [place] Living arrangements for the past 2 months: Single Family Home                                       Social Determinants of Health (SDOH) Interventions    Readmission Risk Interventions No flowsheet data found.

## 2021-01-16 ENCOUNTER — Inpatient Hospital Stay (HOSPITAL_COMMUNITY): Payer: BC Managed Care – PPO

## 2021-01-16 DIAGNOSIS — G9340 Encephalopathy, unspecified: Secondary | ICD-10-CM

## 2021-01-16 DIAGNOSIS — I4891 Unspecified atrial fibrillation: Secondary | ICD-10-CM | POA: Diagnosis not present

## 2021-01-16 LAB — CBC WITH DIFFERENTIAL/PLATELET
Abs Immature Granulocytes: 0.02 10*3/uL (ref 0.00–0.07)
Basophils Absolute: 0 10*3/uL (ref 0.0–0.1)
Basophils Relative: 1 %
Eosinophils Absolute: 0.1 10*3/uL (ref 0.0–0.5)
Eosinophils Relative: 1 %
HCT: 37.9 % (ref 36.0–46.0)
Hemoglobin: 12.3 g/dL (ref 12.0–15.0)
Immature Granulocytes: 0 %
Lymphocytes Relative: 11 %
Lymphs Abs: 0.7 10*3/uL (ref 0.7–4.0)
MCH: 28.6 pg (ref 26.0–34.0)
MCHC: 32.5 g/dL (ref 30.0–36.0)
MCV: 88.1 fL (ref 80.0–100.0)
Monocytes Absolute: 0.7 10*3/uL (ref 0.1–1.0)
Monocytes Relative: 11 %
Neutro Abs: 5 10*3/uL (ref 1.7–7.7)
Neutrophils Relative %: 76 %
Platelets: UNDETERMINED 10*3/uL (ref 150–400)
RBC: 4.3 MIL/uL (ref 3.87–5.11)
RDW: 20.7 % — ABNORMAL HIGH (ref 11.5–15.5)
WBC: 6.6 10*3/uL (ref 4.0–10.5)
nRBC: 0 % (ref 0.0–0.2)

## 2021-01-16 LAB — COMPREHENSIVE METABOLIC PANEL
ALT: 18 U/L (ref 0–44)
AST: 30 U/L (ref 15–41)
Albumin: 3.1 g/dL — ABNORMAL LOW (ref 3.5–5.0)
Alkaline Phosphatase: 64 U/L (ref 38–126)
Anion gap: 14 (ref 5–15)
BUN: 13 mg/dL (ref 8–23)
CO2: 29 mmol/L (ref 22–32)
Calcium: 9 mg/dL (ref 8.9–10.3)
Chloride: 96 mmol/L — ABNORMAL LOW (ref 98–111)
Creatinine, Ser: 0.89 mg/dL (ref 0.44–1.00)
GFR, Estimated: >60 mL/min (ref >60)
Glucose, Bld: 137 mg/dL — ABNORMAL HIGH (ref 70–99)
Potassium: 3.4 mmol/L — ABNORMAL LOW (ref 3.5–5.1)
Sodium: 139 mmol/L (ref 135–145)
Total Bilirubin: 5.4 mg/dL — ABNORMAL HIGH (ref 0.3–1.2)
Total Protein: 6.8 g/dL (ref 6.5–8.1)

## 2021-01-16 LAB — BLOOD GAS, ARTERIAL
Acid-Base Excess: 8.6 mmol/L — ABNORMAL HIGH (ref 0.0–2.0)
Bicarbonate: 32.5 mmol/L — ABNORMAL HIGH (ref 20.0–28.0)
Drawn by: 164
FIO2: 23
O2 Saturation: 88.9 %
Patient temperature: 37
pCO2 arterial: 44.5 mmHg (ref 32.0–48.0)
pH, Arterial: 7.476 — ABNORMAL HIGH (ref 7.350–7.450)
pO2, Arterial: 57 mmHg — ABNORMAL LOW (ref 83.0–108.0)

## 2021-01-16 LAB — GLUCOSE, CAPILLARY
Glucose-Capillary: 127 mg/dL — ABNORMAL HIGH (ref 70–99)
Glucose-Capillary: 128 mg/dL — ABNORMAL HIGH (ref 70–99)
Glucose-Capillary: 131 mg/dL — ABNORMAL HIGH (ref 70–99)
Glucose-Capillary: 131 mg/dL — ABNORMAL HIGH (ref 70–99)
Glucose-Capillary: 135 mg/dL — ABNORMAL HIGH (ref 70–99)
Glucose-Capillary: 135 mg/dL — ABNORMAL HIGH (ref 70–99)
Glucose-Capillary: 149 mg/dL — ABNORMAL HIGH (ref 70–99)
Glucose-Capillary: 160 mg/dL — ABNORMAL HIGH (ref 70–99)

## 2021-01-16 MED ORDER — LACTULOSE ENEMA
300.0000 mL | Freq: Every day | ORAL | Status: DC
  Filled 2021-01-16: qty 300

## 2021-01-16 MED ORDER — DILTIAZEM HCL-DEXTROSE 125-5 MG/125ML-% IV SOLN (PREMIX)
5.0000 mg/h | INTRAVENOUS | Status: DC
  Administered 2021-01-16: 5 mg/h via INTRAVENOUS
  Filled 2021-01-16: qty 125

## 2021-01-16 NOTE — Progress Notes (Signed)
RT NOTES: ABG ordered for 1330. RN instructed RT to wait until after 1400 to obtain ABG per MD.

## 2021-01-16 NOTE — Progress Notes (Signed)
Kept in contact today with Dr. Verlon Au today about the Pt. She was very sleepy this AM, and could not keep her CPap on, and would not talk with her husband.  Dr. Verlon Au, asked that the Pt not be disturbed and allow to sleep from 8 AM until 12:30  And then have blood gas taken, leaving her Cpap on.  Pt. Was very agitate with the CPap and would pull it off and cry out for help.  Notified Dr. Verlon Au around 11 AM, he came down and assessed her, and asked for her to rest until 2 PM.  She was able to sleep until 2PM, and would not walk up fully, she would moan and cry out for help and say [facility].   Pt was cleaned and blood gas drawn, and she still did not wake up. Dr. Verlon Au came down and assessed Pt again, and asked for her to rest again for 1 to 1.5 hrs. Which we did.  She still would not wake up.  Critical care MD notified and seen around 4PM. Trial BiPap was done and Pt did not tolerate.   Dr. Lamonte Sakai advised that if Pt becomes not responding during the night, and will able to tolerate the BiPap to but it on her tonight.  He also wanted her to get the Lactulose  today, atleast the 20 ml that she would not take earlier this afternoon.  He would like the Pt to have coretrack or something.  He said he would decide and let me know I did not hear anything more from Dr. Lamonte Sakai of his decision. I paged Dr. Verlon Au and received order for Rectal Tube, and placed in Pt and administered the rest of the Lactulose.

## 2021-01-16 NOTE — Plan of Care (Signed)
  Problem: Elimination: Goal: Will not experience complications related to bowel motility Outcome: Progressing Goal: Will not experience complications related to urinary retention Outcome: Progressing   

## 2021-01-16 NOTE — Progress Notes (Signed)
RT NOTES: ABG obtained and sent to lab. Lab tech notified.

## 2021-01-16 NOTE — Progress Notes (Signed)
RT NOTES: Pt unable to tolerate bipap, patient screaming and moaning. Dr Lamonte Sakai at bedside. Instructed to remove pt from bipap at this time but to leave in room. Placed on 4lpm nasal cannula. Sats 97%. Will continue to monitor.

## 2021-01-16 NOTE — Consult Note (Signed)
NAME:  Hannah Khan, MRN:  [id number], DOB:  Oct 02, 1955, LOS: 6 ADMISSION DATE:  01/10/2021, CONSULTATION DATE: 01/16/2021 REFERRING MD: Dr. Verlon Au, CHIEF COMPLAINT: Altered mental status  History of Present Illness:  65 year old morbidly obese (BMI 16) woman with cryptogenic cirrhosis, diabetes type 2, fibromyalgia and chronic pain on narcotics, hypertension and atrial fibrillation (often complicated by RVR) with chronic diastolic CHF, OSA with poor CPAP compliance.  She has prior recent admissions for strep mitis bacteremia, lower extremity cellulitis, RVR.  She underwent recent shoulder surgery 12/30/2020, prescribed oxycodone and Flexeril.  Admitted now 01/10/2021 with encephalopathy, hypersomnolence, RVR.  CT head reassuring.  Ammonia 78.  Chest x-ray unremarkable, CT chest with atelectasis, some small bilateral effusions.  Treated with diltiazem, empiric ceftriaxone, lactulose.  Narcotics and Flexeril were both held.  She had some improvement in mentation.  4/30 noted to be more somnolent, encephalopathic.  Lactulose dosing had been decreased 4/28.  ABG 7.47 / 44/ 57 .   Pertinent  Medical History   Past Medical History:  Diagnosis Date  . Anemia    hx of  . Arthritis    knees  . Diabetes mellitus without complication ([place])    type 2  . Fibromyalgia   . HTN (hypertension)   . Hx of colonic polyps    s/p partial colectomy  . Hyperlipidemia   . Joint pain    in knees and back spasms  . Left leg swelling    wear compression hose  . Liver cirrhosis secondary to NASH (nonalcoholic steatohepatitis) ([place]) dx nov 2014   had enlarged spleen also   . Low blood pressure    bp varies  . Morbid obesity ([place])   . Persistent atrial fibrillation ([place])   . Pneumonia 10/2016  . PONV (postoperative nausea and vomiting)    in past none recent  . Portal hypertension ([place])   . Sleep apnea    uses cpap setting of 14  . Spleen enlarged   . 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 [facility])     Significant Hospital Events: Including procedures, antibiotic start and stop dates in addition to other pertinent events   .   Interim History / Subjective:   Patient was able to take some of her lactulose this pm by mouth, did not get the full dose.   Objective   Blood pressure 124/74, pulse 95, temperature 97.9 F (36.6 C), temperature source Oral, resp. rate 15, height 5' 7"  (1.702 m), weight (!) 155.1 kg, SpO2 97 %.        Intake/Output Summary (Last 24 hours) at 01/16/2021 1559 Last data filed at 01/16/2021 0908 Gross per 24 hour  Intake --  Output 3400 ml  Net -3400 ml   Filed Weights   01/14/21 0420 01/15/21 0528 01/16/21 0552  Weight: (!) 166.1 kg (!) 160.8 kg (!) 155.1 kg    Examination: General: Obese woman, laying in bed, sleepy HENT: Oropharynx clear, no snoring, no upper airway noise or secretions.  Pupils equal and react Lungs: Decreased bilaterally, no wheezing, bibasilar inspiratory crackles Cardiovascular: Regular, distant, no murmur Abdomen: Obese, nondistended, positive bowel sounds Extremities: Compression stockings in place, trace pretibial edema.  Pain on pretibial palpation Neuro: She does wake to voice and tries to interact but quickly back to sleep.  Did not follow commands.  Did tell me no and try to push me away  Labs/imaging that I havepersonally reviewed  (right click and "Reselect all SmartList Selections" daily)  CXR , CT chest 4/24.  ABG, 4/30 Intact renal fxn 4/30 WBC normal, plt clumped  Ammonia 78 >> 62  Resolved Hospital Problem list     Assessment & Plan:   NASH cirrhosis with Acute toxic metabolic, hepatic encephalopathy.  No current evidence on her most recent ABG for hypercapnia although she is at risk for progression, increased PCO2.  Suspect that her hepatic encephalopathy is the driver here.   -avoiding sedating medication -Need to get her lactulose into her.  Either have her take it effectively p.o., consider placing NG  tube although this would make her maintenance CPAP or BiPAP difficult, or give PR  OSA, obesity hypoventilation syndrome with associated hypoxemia.  At high risk hypercapnia but currently compensated. -She will need ventilatory support, plan to try BiPAP to see if she will tolerate.  If unable then may try to transition her back to her usual CPAP.  She has had difficulty tolerating and is been taking it off. If she will not tolerate either due to agitation then we will keep available to use in the event that she becomes obtunded, hopefully in lieu of intubation although she is at high risk for requiring MV -Titrate FiO2 to maintain SPO2 just above 90%  A Fib  -starting dilt gtt, as she is currently unable to take her PO meds  HTN with acute on chronic diastolic CHF - diuresing effectively on current regimen - following renal fxn on diuretics   Best practice (right click and "Reselect all SmartList Selections" daily)  Diet:  NPO Pain/Anxiety/Delirium protocol (if indicated): No VAP protocol (if indicated): Not indicated DVT prophylaxis: SCD GI prophylaxis: N/A Glucose control:  SSI Yes Central venous access:  N/A Arterial line:  N/A Foley:  N/A Mobility:  bed rest  PT consulted: N/A Last date of multidisciplinary goals of care discussion [pending] Code Status:  full code Disposition: Progressive Care, low threshold to move to ICU if MS worsens.   Labs   CBC: Recent Labs  Lab 01/10/21 0017 01/10/21 0046 01/10/21 0430 01/11/21 0039 01/12/21 0048 01/12/21 1333 01/13/21 0717 01/16/21 0059  WBC 7.4  --  6.4 7.3 4.5  --  5.5 6.6  NEUTROABS 6.2  --   --   --   --   --   --  5.0  HGB 11.4*   < > 10.9* 12.1 11.3*  --  10.9* 12.3  HCT 36.9   < > 35.3* 39.3 36.1  --  34.5* 37.9  MCV 90.7  --  91.0 91.6 91.4  --  89.4 88.1  PLT PLATELET CLUMPS NOTED ON SMEAR, UNABLE TO ESTIMATE  --  PLATELET CLUMPING, SUGGEST RECOLLECTION OF SAMPLE IN CITRATE TUBE. PLATELET CLUMPING, SUGGEST  RECOLLECTION OF SAMPLE IN CITRATE TUBE. PLATELET CLUMPS NOTED ON SMEAR, UNABLE TO ESTIMATE PLATELET CLUMPS NOTED ON SMEAR, UNABLE TO ESTIMATE PLATELET CLUMPS NOTED ON SMEAR, UNABLE TO ESTIMATE PLATELET CLUMPS NOTED ON SMEAR, UNABLE TO ESTIMATE   < > = values in this interval not displayed.    Basic Metabolic Panel: Recent Labs  Lab 01/10/21 0430 01/11/21 0039 01/12/21 0048 01/13/21 0717 01/14/21 0033 01/15/21 0026 01/16/21 0059  NA 137   < > 140 137 136 137 139  K 4.0   < > 3.3* 3.7 3.6 3.1* 3.4*  CL 104   < > 103 101 98 97* 96*  CO2 23   < > 28 31 32 32 29  GLUCOSE 206*   < > 134* 132* 136* 136* 137*  BUN 18   < >  20 19 17 13 13   CREATININE 0.80   < > 0.83 0.77 0.85 0.81 0.89  CALCIUM 8.7*   < > 8.5* 8.5* 8.7* 8.9 9.0  MG 1.9  --   --  1.8  --   --   --    < > = values in this interval not displayed.   GFR: Estimated Creatinine Clearance: 99.8 mL/min (by C-G formula based on SCr of 0.89 mg/dL). Recent Labs  Lab 01/11/21 0039 01/12/21 0048 01/13/21 0717 01/16/21 0059  WBC 7.3 4.5 5.5 6.6    Liver Function Tests: Recent Labs  Lab 01/12/21 0048 01/13/21 0717 01/14/21 0033 01/15/21 0026 01/16/21 0059  AST 30 27 27 25 30   ALT 15 15 15 16 18   ALKPHOS 54 56 61 57 64  BILITOT 3.6* 5.0* 5.4* 4.3* 5.4*  PROT 6.0* 5.9* 6.0* 6.2* 6.8  ALBUMIN 2.7* 2.8* 2.7* 2.8* 3.1*   No results for input(s): LIPASE, AMYLASE in the last 168 hours. Recent Labs  Lab 01/10/21 0430 01/12/21 0048  AMMONIA 78* 62*    ABG    Component Value Date/Time   PHART 7.476 (H) 01/16/2021 1421   PCO2ART 44.5 01/16/2021 1421   PO2ART 57.0 (L) 01/16/2021 1421   HCO3 32.5 (H) 01/16/2021 1421   TCO2 24 01/10/2021 0120   ACIDBASEDEF 6.0 (H) 03/15/2018 0148   O2SAT 88.9 01/16/2021 1421     Coagulation Profile: Recent Labs  Lab 01/10/21 0017  INR 1.4*    Cardiac Enzymes: No results for input(s): CKTOTAL, CKMB, CKMBINDEX, TROPONINI in the last 168 hours.  HbA1C: Hgb A1c MFr Bld   Date/Time Value Ref Range Status  08/28/2017 10:26 AM 5.6 4.8 - 5.6 % Final    Comment:    (NOTE) Pre diabetes:          5.7%-6.4% Diabetes:              >6.4% Glycemic control for   <7.0% adults with diabetes   11/01/2016 05:15 AM 5.8 (H) 4.8 - 5.6 % Final    Comment:    (NOTE)         Pre-diabetes: 5.7 - 6.4         Diabetes: >6.4         Glycemic control for adults with diabetes: <7.0     CBG: Recent Labs  Lab 01/16/21 0019 01/16/21 0346 01/16/21 0759 01/16/21 1108 01/16/21 1410  GLUCAP 135* 149* 131* 160* 127*    Review of Systems:   Patient unable to give   Past Medical History:  She,  has a past medical history of Anemia, Arthritis, Diabetes mellitus without complication ([place]), Fibromyalgia, HTN (hypertension), colonic polyps, Hyperlipidemia, Joint pain, Left leg swelling, Liver cirrhosis secondary to NASH (nonalcoholic steatohepatitis) ([place]) (dx nov 2014), Low blood pressure, Morbid obesity ([place]), Persistent atrial fibrillation ([place]), Pneumonia (10/2016), PONV (postoperative nausea and vomiting), Portal hypertension ([place]), Sleep apnea, Spleen enlarged, and 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 ([place]).   Surgical History:   Past Surgical History:  Procedure Laterality Date  . BREAST LUMPECTOMY WITH RADIOACTIVE SEED LOCALIZATION Right 08/29/2017   Procedure: RIGHT BREAST LUMPECTOMY WITH RADIOACTIVE SEEDS LOCALIZATION;  Surgeon: Erroll Luna, MD;  Location: [facility];  Service: General;  Laterality: Right;  . CESAREAN SECTION  1989  . CHOLECYSTECTOMY  20 yrs ago  . COLECTOMY  2011  . COLONOSCOPY    . COLONOSCOPY N/A 09/05/2018   Procedure: COLONOSCOPY;  Surgeon: [name], MD;  Location: [facility];  Service: Endoscopy;  Laterality: N/A;  .  DILATATION & CURETTAGE/HYSTEROSCOPY WITH MYOSURE N/A 05/06/2016   Procedure: DILATATION & CURETTAGE/HYSTEROSCOPY WITH MYOSURE WITH POLYPECTOMY;  Surgeon: Janyth Pupa, DO;  Location: [facility];  Service: Gynecology;   Laterality: N/A;  . ESOPHAGOGASTRODUODENOSCOPY (EGD) WITH PROPOFOL N/A 09/04/2013   Procedure: ESOPHAGOGASTRODUODENOSCOPY (EGD) WITH PROPOFOL;  Surgeon: [name], MD;  Location: [facility];  Service: Endoscopy;  Laterality: N/A;  . KNEE ARTHROSCOPY  yrs ago   bilateral, one done x 1, one done twice  . POLYPECTOMY  09/05/2018   Procedure: POLYPECTOMY;  Surgeon: [name], MD;  Location: [facility];  Service: Endoscopy;;     Social History:   reports that she quit smoking about 46 years ago. Her smoking use included cigarettes. She has a 3.00 pack-year smoking history. She has never used smokeless tobacco. She reports current alcohol use. She reports that she does not use drugs.   Family History:  Her family history includes Cancer in her brother, father, and mother; Congestive Heart Failure in her mother; Heart failure in her mother; Hyperlipidemia in her brother and mother; Lung cancer in her father; Stroke in her mother and another family member.   Allergies Allergies  Allergen Reactions  . Celecoxib Other (See Comments)    "speech slurred" with celebrex  . Shellfish Allergy Swelling and Rash    TINGLING AND SWELLING OF LIPS. LARGE WHELPS QUARTER-SIZE  . Barium-Containing Compounds Other (See Comments)    TACHYCARDIA  . Prednisone Other (See Comments)    TACHYCARDIA  . Statins Other (See Comments)    AGGRAVATED FIBROMYALGIA  . Fentanyl     Hallucinations   . Ibuprofen Other (See Comments)    UNSPECIFIED SPECIFIC REACTION >> "DUE TO PLATELETS"  . Tramadol     Hallucination   . Tylenol [Acetaminophen] Other (See Comments)    UNSPECIFIED SPECIFIC REACTION >> "DUE TO PLATELETS"     Home Medications  Prior to Admission medications   Medication Sig Start Date End Date Taking? Authorizing Provider  albuterol (PROVENTIL HFA;VENTOLIN HFA) 108 (90 Base) MCG/ACT inhaler Inhale 2 puffs into the lungs every 6 (six) hours as needed for wheezing or shortness of breath.   11/01/18  Yes [provider]  bumetanide (BUMEX) 2 MG tablet TAKE 1 TABLET (2 MG TOTAL) BY MOUTH 2 (TWO) TIMES DAILY. Patient taking differently: Take 2 mg by mouth daily. 08/12/20  Yes Kilroy, Luke K, PA-C  cyclobenzaprine (FLEXERIL) 10 MG tablet Take 10 mg by mouth 3 (three) times daily. 01/06/21  Yes [provider]  diltiazem (CARDIZEM CD) 300 MG 24 hr capsule Take 1 capsule (300 mg total) by mouth daily. 08/05/20  Yes Evans Lance, MD  JARDIANCE 25 MG TABS tablet Take 25 mg by mouth daily after breakfast.  01/28/17  Yes [provider]  metolazone (ZAROXOLYN) 5 MG tablet TAKE 1 EVERY OTHER DAY 1 HOUR PRIOR TO YOUR BUMEX FOR 3 DAYS ONLY OR AS DIRECTED Patient taking differently: Take 5 mg by mouth every other day. TAKE 1 EVERY OTHER DAY 1 HOUR PRIOR TO YOUR BUMEX FOR 3 DAYS ONLY OR AS DIRECTED 09/03/20  Yes Skeet Latch, MD  metoprolol tartrate (LOPRESSOR) 50 MG tablet Take 1 tablet (50 mg total) by mouth 2 (two) times daily. 06/17/20  Yes Skeet Latch, MD  mirabegron ER (MYRBETRIQ) 50 MG TB24 tablet Take 50 mg by mouth daily.   Yes [provider]  ondansetron (ZOFRAN) 4 MG tablet Take 4 mg by mouth every 8 (eight) hours as needed for vomiting or nausea.  12/31/20  Yes [provider]  oxyCODONE (OXY IR/ROXICODONE) 5 MG immediate release tablet Take 5 mg by mouth every 4 (four) hours as needed for moderate pain or severe pain. 01/09/21  Yes [provider]  potassium chloride (KLOR-CON SPRINKLE) 10 MEQ CR capsule TAKE 4 DAILY Patient taking differently: Take 20 mEq by mouth 2 (two) times daily. 06/17/20  Yes Skeet Latch, MD  spironolactone (ALDACTONE) 25 MG tablet Take 1 tablet (25 mg total) by mouth daily. Schedule ov for further refills. 06/17/20  Yes Skeet Latch, MD  Lancets [facility] DELICA PLUS [id number]) MISC Apply topically 3 (three) times daily. 04/10/20   [provider]  [facility] VERIO test strip 1 each 3  (three) times daily. 04/13/20   [provider]     Critical care time: 37 min     Baltazar Apo, MD, PhD 01/16/2021, 5:25 PM [facility] [phone] or if no answer before 7:00PM call [phone] For any issues after 7:00PM please call eLink [phone]

## 2021-01-16 NOTE — Progress Notes (Addendum)
PROGRESS NOTE   Hannah Khan  XTG:[id number] DOB: 15-Aug-1956 DOA: 01/10/2021 PCP: Lujean Amel, MD  Brief Narrative:   10 white female BMI 87 Cryptogenic cirrhosis + TCP NIDDM since 2017 OSA not on CPAP Fibromyalgia/MSK pain Colon adenoma status post polypectomy HFpEF A. fib CHADS2 score >4-not watchman or DCCV candidate Prior admissions for severe sepsis secondary to cellulitis lower extremity Recurrent admissions for RVR Last admission 03/14/2020-->03/18/2020 Streptococcus mitis bacteremia  She had recent shoulder surgery Dr. Berenice Primas in [facility] 12/30/2020-presented to [facility] ED acute altered mental status--had been discharged on oxycodone/Flexeril and using her CPAP as needed Developed incontinent urine continued to be somnolent confused On arrival EMS found her A. fib RVR rates 130s hypertensive 160 CXR negative for disease CT head negative for intracranial abnormality CT chest = bilateral pleural effusions pulmonary edema Initial Rx Rocephin Narcan Cardizem Ammonia found to be 78  Started on lactulose-when the dosage was dropped patient became more encephalopathic Her encephalopathy has not cleared and critical care has been consulted to assist with management as she is not wearing her CPAP for the full night and has been somewhat more combative with refused to have the same  Hospital-Problem based course   A. fib RVR CHADS2 score >4 not a candidate for watchman or DCCV Stop metoprolol 50 twice daily, Cardizem 360-- not safe to take meds Placed on Cardizem gtt. without bolus Toxic metabolic encephalopathy 2/2 hyperammonemia from cirrhosis Additionally recent prescription opiates, muscle relaxants from shoulder surgery Lactulose tapered 4/28 and patient has become more lethargic and unarousable-she now has a flap Have increased lactulose to 30 mg twice daily--her mentation has not cleared Will need lactulose enema P CXR ordered STAT r/o PNA OSA on CPAP at  night Patient is somewhat confused and did not wear CPAP much of the night-was awake most of the night and is sleepy now Blood gas today shows 7.47/44/57 which is compensated however she refuses to wear her CPAP  I have asked nursing to place her on NRB for now and see if she will try to wear her CPA Appreciate CCM guidance HFpEF dryest weight is about 154 kg 171--> 155 kg currently Minus 12.8 L/since admit Lasix 80 3 times daily IV-Home Bumex = 2 mg twice daily  Stockings, feet above heart, continue diuresis Karlene Lineman cirrhosis Hyperammonemia Lactulose as above Holding Aldactone 25 mg.today Monitor trends Diabetes mellitus type 2 Cannot afford Jardiance-hold metformin today as sleepy Continue sliding scale coverage Recent right shoulder arthroplasty [facility] Dr. Berenice Primas Postop management was supposed to be 01/11/2021 OT to evaluate and treat given recent right shoulder surgery Lactobacillus UTI?  Without features of sepsis Continue Rocephin-discontinued 4/27-has no features of infection at this time repeat CBC a.m. Hypokalemia from prior to admission Hold oral meds today-we will have to replace IV Colon adenoma status post polypectomy Prior Streptococcus mitis bacteremia Needs outpatient surveillance of the same    DVT prophylaxis: SCD Code Status: Full Family Communication: Discussed in detail with husband at the bedside this morning and this afternoon prior to leaving [place], [place] [phone]  [phone]   Disposition:  Status is: Inpatient Remains inpatient appropriate because:Hemodynamically unstable, Altered mental status, Ongoing diagnostic testing needed not appropriate for outpatient work up and Unsafe d/c plan Dispo: The patient is from: Home              Anticipated d/c is to: SNF in 2 to 3 days              Patient currently is  not medically stable to d/c.   Difficult to place patient No    Consultants:   None currently  Procedures:  No  Antimicrobials: Rocephin through 4/27   Subjective:  Has remained confused most of the day I reevaluated her at 2 PM I reviewed her blood gas She awakens momentarily but then falls right back to sleep-nursing was able to give essential meds (metoprolol diltiazem lactulose) but she has been on interactive today Heart rate is going up-- she has been in A. fib with RVR most of the day  Objective: Vitals:   01/16/21 1101 01/16/21 1118 01/16/21 1119 01/16/21 1549  BP: 134/73   124/74  Pulse: (!) 107 (!) 101 (!) 102 95  Resp: (!) 22 13 13 15   Temp: 97.9 F (36.6 C)   97.9 F (36.6 C)  TempSrc: Oral   Oral  SpO2: 90%  (!) 89% 97%  Weight:      Height:        Intake/Output Summary (Last 24 hours) at 01/16/2021 1624 Last data filed at 01/16/2021 0908 Gross per 24 hour  Intake --  Output 3400 ml  Net -3400 ml   Filed Weights   01/14/21 0420 01/15/21 0528 01/16/21 0552  Weight: (!) 166.1 kg (!) 160.8 kg (!) 155.1 kg    Examination:  Confused arousable cannot orient to time place person Neck soft supple Thick neck Mallampati 4 Abdomen soft nontender no rebound no guarding Flap present Somnolent at times   Data Reviewed: personally reviewed  BUN/creatinine 13/0.8 Potassium 3.4 Chloride 96 Clumped platelets   Radiology Studies: No results found.  Scheduled Meds: . furosemide  80 mg Intravenous Q8H  . insulin aspart  0-9 Units Subcutaneous Q4H  . [START ON 01/17/2021] lactulose  300 mL Rectal Daily  . lidocaine  1 patch Transdermal Q24H   Continuous Infusions:   LOS: 6 days   Time spent: [place], MD [facility] To contact the attending provider between 7A-7P or the covering provider during after hours 7P-7A, please log into the web site [email or web address] and access using universal [place] password for that web site. If you do not have the password, please call the hospital operator.  01/16/2021, 4:24 PM

## 2021-01-16 NOTE — Plan of Care (Signed)

## 2021-01-16 NOTE — TOC Progression Note (Signed)
Transition of Care [facility]) - Progression Note    Patient Details  Name: Hannah Khan MRN: [id number] Date of Birth: 05-29-1956  Transition of Care [facility]) CM/SW [place], [place] Phone Number: [phone] 01/16/2021, 2:34 PM  Clinical Narrative:    Patient currently does not have any bed offers.  TOC team will continue to assist with discharge planning needs.  Expected Discharge Plan: [place] Barriers to Discharge: Continued Medical Work up  Expected Discharge Plan and Services Expected Discharge Plan: [place] In-house Referral: Clinical Social Work   Post Acute Care Choice: [place] Living arrangements for the past 2 months: Single Family Home                                       Social Determinants of Health (SDOH) Interventions    Readmission Risk Interventions No flowsheet data found.

## 2021-01-17 DIAGNOSIS — G4733 Obstructive sleep apnea (adult) (pediatric): Secondary | ICD-10-CM | POA: Diagnosis not present

## 2021-01-17 DIAGNOSIS — I4891 Unspecified atrial fibrillation: Secondary | ICD-10-CM | POA: Diagnosis not present

## 2021-01-17 DIAGNOSIS — J9601 Acute respiratory failure with hypoxia: Secondary | ICD-10-CM

## 2021-01-17 DIAGNOSIS — G9340 Encephalopathy, unspecified: Secondary | ICD-10-CM | POA: Diagnosis not present

## 2021-01-17 LAB — COMPREHENSIVE METABOLIC PANEL
ALT: 18 U/L (ref 0–44)
AST: 27 U/L (ref 15–41)
Albumin: 3 g/dL — ABNORMAL LOW (ref 3.5–5.0)
Alkaline Phosphatase: 65 U/L (ref 38–126)
Anion gap: 10 (ref 5–15)
BUN: 14 mg/dL (ref 8–23)
CO2: 30 mmol/L (ref 22–32)
Calcium: 8.6 mg/dL — ABNORMAL LOW (ref 8.9–10.3)
Chloride: 97 mmol/L — ABNORMAL LOW (ref 98–111)
Creatinine, Ser: 0.82 mg/dL (ref 0.44–1.00)
GFR, Estimated: >60 mL/min (ref >60)
Glucose, Bld: 134 mg/dL — ABNORMAL HIGH (ref 70–99)
Potassium: 3.3 mmol/L — ABNORMAL LOW (ref 3.5–5.1)
Sodium: 137 mmol/L (ref 135–145)
Total Bilirubin: 5.7 mg/dL — ABNORMAL HIGH (ref 0.3–1.2)
Total Protein: 6.6 g/dL (ref 6.5–8.1)

## 2021-01-17 LAB — CBC WITH DIFFERENTIAL/PLATELET
Abs Immature Granulocytes: 0.02 10*3/uL (ref 0.00–0.07)
Basophils Absolute: 0 10*3/uL (ref 0.0–0.1)
Basophils Relative: 1 %
Eosinophils Absolute: 0.1 10*3/uL (ref 0.0–0.5)
Eosinophils Relative: 2 %
HCT: 38.3 % (ref 36.0–46.0)
Hemoglobin: 12.3 g/dL (ref 12.0–15.0)
Immature Granulocytes: 1 %
Lymphocytes Relative: 17 %
Lymphs Abs: 0.7 10*3/uL (ref 0.7–4.0)
MCH: 28.4 pg (ref 26.0–34.0)
MCHC: 32.1 g/dL (ref 30.0–36.0)
MCV: 88.5 fL (ref 80.0–100.0)
Monocytes Absolute: 0.5 10*3/uL (ref 0.1–1.0)
Monocytes Relative: 11 %
Neutro Abs: 2.8 10*3/uL (ref 1.7–7.7)
Neutrophils Relative %: 68 %
RBC: 4.33 MIL/uL (ref 3.87–5.11)
RDW: 20.8 % — ABNORMAL HIGH (ref 11.5–15.5)
WBC: 4.2 10*3/uL (ref 4.0–10.5)
nRBC: 0 % (ref 0.0–0.2)

## 2021-01-17 LAB — GLUCOSE, CAPILLARY
Glucose-Capillary: 126 mg/dL — ABNORMAL HIGH (ref 70–99)
Glucose-Capillary: 129 mg/dL — ABNORMAL HIGH (ref 70–99)
Glucose-Capillary: 136 mg/dL — ABNORMAL HIGH (ref 70–99)
Glucose-Capillary: 143 mg/dL — ABNORMAL HIGH (ref 70–99)
Glucose-Capillary: 162 mg/dL — ABNORMAL HIGH (ref 70–99)
Glucose-Capillary: 219 mg/dL — ABNORMAL HIGH (ref 70–99)

## 2021-01-17 LAB — MAGNESIUM: Magnesium: 1.9 mg/dL (ref 1.7–2.4)

## 2021-01-17 MED ORDER — LACTULOSE 10 GM/15ML PO SOLN
30.0000 g | Freq: Three times a day (TID) | ORAL | Status: DC
  Administered 2021-01-17 – 2021-01-22 (×17): 30 g via ORAL
  Filled 2021-01-17 (×17): qty 45

## 2021-01-17 MED ORDER — BUMETANIDE 2 MG PO TABS
2.0000 mg | ORAL_TABLET | Freq: Every day | ORAL | Status: DC
  Administered 2021-01-17 – 2021-01-22 (×6): 2 mg via ORAL
  Filled 2021-01-17 (×6): qty 1

## 2021-01-17 MED ORDER — POTASSIUM CHLORIDE 20 MEQ PO PACK
20.0000 meq | PACK | Freq: Every day | ORAL | Status: DC
  Administered 2021-01-17 – 2021-01-21 (×5): 20 meq via ORAL
  Filled 2021-01-17 (×5): qty 1

## 2021-01-17 MED ORDER — DILTIAZEM HCL-DEXTROSE 125-5 MG/125ML-% IV SOLN (PREMIX)
5.0000 mg/h | INTRAVENOUS | Status: DC
  Administered 2021-01-17: 10 mg/h via INTRAVENOUS
  Filled 2021-01-17 (×2): qty 125

## 2021-01-17 MED ORDER — DILTIAZEM HCL ER COATED BEADS 180 MG PO CP24: 300.0000 mg | ORAL_CAPSULE | Freq: Every day | ORAL | Status: DC

## 2021-01-17 MED ORDER — METOPROLOL TARTRATE 50 MG PO TABS
50.0000 mg | ORAL_TABLET | Freq: Two times a day (BID) | ORAL | Status: DC
  Administered 2021-01-17 – 2021-01-22 (×11): 50 mg via ORAL
  Filled 2021-01-17 (×6): qty 1
  Filled 2021-01-17: qty 2
  Filled 2021-01-17 (×5): qty 1

## 2021-01-17 MED ORDER — SPIRONOLACTONE 12.5 MG HALF TABLET
12.5000 mg | ORAL_TABLET | Freq: Every day | ORAL | Status: DC
  Administered 2021-01-17 – 2021-01-21 (×5): 12.5 mg via ORAL
  Filled 2021-01-17 (×5): qty 1

## 2021-01-17 MED ORDER — GERHARDT'S BUTT CREAM
TOPICAL_CREAM | CUTANEOUS | Status: DC | PRN
  Filled 2021-01-17: qty 1

## 2021-01-17 NOTE — Plan of Care (Signed)

## 2021-01-17 NOTE — Plan of Care (Signed)
  Problem: Nutrition: Goal: Adequate nutrition will be maintained Outcome: Progressing   Problem: Elimination: Goal: Will not experience complications related to urinary retention Outcome: Progressing   

## 2021-01-17 NOTE — Progress Notes (Addendum)
PROGRESS NOTE   Hannah Khan  ZOX:[id number] DOB: 04-18-1956 DOA: 01/10/2021 PCP: Lujean Amel, MD  Brief Narrative:   58 white female BMI 60 Cryptogenic cirrhosis + TCP NIDDM since 2017 OSA not on CPAP Fibromyalgia/MSK pain Colon adenoma status post polypectomy HFpEF A. fib CHADS2 score >4-not watchman or DCCV candidate Prior admissions for severe sepsis secondary to cellulitis lower extremity Recurrent admissions for RVR Last admission 03/14/2020-->03/18/2020 Streptococcus mitis bacteremia  She had recent shoulder surgery Dr. Berenice Primas in [facility] 12/30/2020-presented to [facility] ED acute altered mental status--had been discharged on oxycodone/Flexeril and using her CPAP as needed Developed incontinent urine continued to be somnolent confused On arrival EMS found her A. fib RVR rates 130s hypertensive 160 CXR negative for disease CT head negative for intracranial abnormality CT chest = bilateral pleural effusions pulmonary edema Initial Rx Rocephin Narcan Cardizem Ammonia found to be 78  Started on lactulose-when the dosage was dropped patient became more encephalopathic Her encephalopathy has not cleared and critical care has been consulted to assist with management as she is not wearing her CPAP for the full night and has been somewhat more combative with refused to have the same  Hospital-Problem based course  Toxic metabolic encephalopathy 2/2 hyperammonemia from cirrhosis Additionally recent prescription opiates, muscle relaxants from shoulder surgery Continue lactulose to 30 mg 3 times daily-mentation is clear Can resume oral meds A. fib RVR CHADS2 score >4 not a candidate for watchman or DCCV Will continue metoprolol 50 twice daily, Cardizem 360 OSA on CPAP at night Continue BiPAP Okay to be off oxygen during the day as long as sat above 88 HFpEF dryest weight is about 154 kg 171--> 155 kg currently Minus 14.4 follow-up L/since admit Resume Home Bumex = 2 mg twice  daily  Stockings, feet above heart, continue diuresis Karlene Lineman cirrhosis Hyperammonemia Lactulose as above Resume at a lower dose Aldactone 12.5 daily Monitor trends Diabetes mellitus type 2 Cannot afford Jardiance-consider metformin in the outpatient setting Continue sliding scale coverage Recent right shoulder arthroplasty [facility] Dr. Berenice Primas Postop management was supposed to be 01/11/2021 OT to evaluate and treat given recent right shoulder surgery Lactobacillus UTI?  Without features of sepsis Continue Rocephin-discontinued 4/27-has no features of infection at this time repeat CBC a.m. Hypokalemia from prior to admission Resuming Aldactone Very low-dose potassium today Colon adenoma status post polypectomy Prior Streptococcus mitis bacteremia Needs outpatient surveillance of the same    DVT prophylaxis: SCD Code Status: Full Family Communication: Discussed in detail with patient's husband at the bedside Ceyda, Peterka [phone]  [phone]  Also communicated on cell phone with her daughter Janett Billow Disposition:  Status is: Inpatient Remains inpatient appropriate because:Hemodynamically unstable, Altered mental status, Ongoing diagnostic testing needed not appropriate for outpatient work up and Unsafe d/c plan Dispo: The patient is from: Home              Anticipated d/c is to: SNF in 2 to 3 days              Patient currently is not medically stable to d/c.   Difficult to place patient No    Consultants:   None currently  Procedures: No  Antimicrobials: Rocephin through 4/27   Subjective:  Much less confused Tolerated diet Able to take lactulose p.o. Emphasized compliance on meds and the need to continue lactulose going forward  Objective: Vitals:   01/17/21 0800 01/17/21 0829 01/17/21 0900 01/17/21 1200  BP: 117/63  (!) 123/59 132/63  Pulse: 87 93 95 97  Resp: 14 18 13 14   Temp: (!) 97.1 F (36.2 C)     TempSrc: Axillary   Axillary  SpO2:  100% 100% 100%   Weight:      Height:        Intake/Output Summary (Last 24 hours) at 01/17/2021 1450 Last data filed at 01/17/2021 0420 Gross per 24 hour  Intake 45 ml  Output 1600 ml  Net -1555 ml   Filed Weights   01/15/21 0528 01/16/21 0552 01/17/21 0420  Weight: (!) 160.8 kg (!) 155.1 kg (!) 151.1 kg    Examination:  Oriented x4 Neck soft supple Thick neck Mallampati 4 Abdomen soft nontender no rebound no guarding Flap present Somnolent at times   Data Reviewed: personally reviewed  BUN/creatinine 14/0.8 Potassium 3.3, magnesium 1.9 Chloride 97 Clumped platelets   Radiology Studies: DG CHEST PORT 1 VIEW  Result Date: 01/16/2021 CLINICAL DATA:  Pneumonia evaluation EXAM: PORTABLE CHEST 1 VIEW COMPARISON:  01/10/2021 FINDINGS: Evaluation limited by body habitus and portable technique. Patient is also rotated. Probable bilateral pleural effusions with adjacent atelectasis. Pulmonary vascular congestion. Cardiomegaly. IMPRESSION: Small bilateral pleural effusions with adjacent atelectasis. Pulmonary vascular congestion. Cardiomegaly. Electronically Signed   By: Macy Mis M.D.   On: 01/16/2021 16:57    Scheduled Meds: . bumetanide  2 mg Oral Daily  . diltiazem  300 mg Oral Daily  . insulin aspart  0-9 Units Subcutaneous Q4H  . lactulose  30 g Oral TID  . lidocaine  1 patch Transdermal Q24H  . metoprolol tartrate  50 mg Oral BID  . potassium chloride  20 mEq Oral Daily  . spironolactone  12.5 mg Oral Daily   Continuous Infusions: . diltiazem (CARDIZEM) infusion 5 mg/hr (01/17/21 0826)    LOS: 7 days   Time spent: [place], MD [facility] To contact the attending provider between 7A-7P or the covering provider during after hours 7P-7A, please log into the web site [email or web address] and access using universal [place] password for that web site. If you do not have the password, please call the hospital operator.  01/17/2021, 2:50 PM

## 2021-01-17 NOTE — Progress Notes (Signed)
NAME:  Hannah Khan, MRN:  [id number], DOB:  [birth year], LOS: 7 ADMISSION DATE:  01/10/2021, CONSULTATION DATE: 01/16/2021 REFERRING MD: Dr. Verlon Au, CHIEF COMPLAINT: Altered mental status  History of Present Illness:  65 year old morbidly obese (BMI 67) woman with cryptogenic cirrhosis, diabetes type 2, fibromyalgia and chronic pain on narcotics, hypertension and atrial fibrillation (often complicated by RVR) with chronic diastolic CHF, OSA with poor CPAP compliance.  She has prior recent admissions for strep mitis bacteremia, lower extremity cellulitis, RVR.  She underwent recent shoulder surgery 12/30/2020, prescribed oxycodone and Flexeril.  Admitted now 01/10/2021 with encephalopathy, hypersomnolence, RVR.  CT head reassuring.  Ammonia 78.  Chest x-ray unremarkable, CT chest with atelectasis, some small bilateral effusions.  Treated with diltiazem, empiric ceftriaxone, lactulose.  Narcotics and Flexeril were both held.  She had some improvement in mentation.  4/30 noted to be more somnolent, encephalopathic.  Lactulose dosing had been decreased 4/28.  ABG 7.47 / 44/ 57 .   Pertinent  Medical History   Past Medical History:  Diagnosis Date  . Anemia    hx of  . Arthritis    knees  . Diabetes mellitus without complication ([place])    type 2  . Fibromyalgia   . HTN (hypertension)   . Hx of colonic polyps    s/p partial colectomy  . Hyperlipidemia   . Joint pain    in knees and back spasms  . Left leg swelling    wear compression hose  . Liver cirrhosis secondary to NASH (nonalcoholic steatohepatitis) ([place]) dx nov 2014   had enlarged spleen also   . Low blood pressure    bp varies  . Morbid obesity ([place])   . Persistent atrial fibrillation ([place])   . Pneumonia 10/2016  . PONV (postoperative nausea and vomiting)    in past none recent  . Portal hypertension ([place])   . Sleep apnea    uses cpap setting of 14  . Spleen enlarged   . 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 [facility])     Significant Hospital Events: Including procedures, antibiotic start and stop dates in addition to other pertinent events   . 4/30 consulted for encephalopathy and marginal airway protection with some associated agitation.  Lactulose increased.  Interim History / Subjective:   Lactulose was increased, changed to PR enemas Went on BiPAP at midnight and has tolerated Less agitated  Objective   Blood pressure (!) 114/59, pulse 87, temperature (!) 97.5 F (36.4 C), temperature source Oral, resp. rate 14, height 5' 7"  (1.702 m), weight (!) 151.1 kg, SpO2 100 %.        Intake/Output Summary (Last 24 hours) at 01/17/2021 0826 Last data filed at 01/17/2021 0420 Gross per 24 hour  Intake 45 ml  Output 2450 ml  Net -2405 ml   Filed Weights   01/15/21 0528 01/16/21 0552 01/17/21 0420  Weight: (!) 160.8 kg (!) 155.1 kg (!) 151.1 kg    Examination: General: Obese woman, a bit sleepy but wakes up easily to voice HENT: Oropharynx clear, no upper airway secretions or noise.  Pupils equal Lungs: Decreased at both bases, a bit better lung volumes on BiPAP Cardiovascular: Regular, distant, no murmur Abdomen: Nondistended, obese, positive bowel sounds Extremities: Compression stockings in place, trace pretibial edema Neuro: Wakes easier to voice, follows commands, much less agitated   Labs/imaging that I havepersonally reviewed  (right click and "Reselect all SmartList Selections" daily)  CMP and CBC 5/1 CBGs reviewed Chest x-ray 4/30 with bibasilar atelectasis  and effusions   Resolved Hospital Problem list     Assessment & Plan:   NASH cirrhosis with Acute toxic metabolic, hepatic encephalopathy.  No current evidence on her most recent ABG for hypercapnia although she is at risk for progression, increased PCO2.  Suspect that her hepatic encephalopathy has been the largest contributor here -Improved with increase lactulose dose and change to PR.  If her mental status allows may be able  to go back to p.o. today. -Continue to avoid sedating medications -Maintenance BiPAP or CPAP as below to avoid any evolving hypercapnia  OSA, obesity hypoventilation syndrome with associated hypoxemia.  At high risk hypercapnia but currently appears to be compensated. -With improved mental status she tolerated BiPAP overnight last night.  She will need either this or a transition back to her usual CPAP nightly.  If her mental status returns to baseline then CPAP should be adequate given her compensated ABG. -Push pulmonary hygiene -Wean FiO2, goal SPO2 just above 90%  A Fib  -Diltiazem infusion until she can get back to her oral medications  HTN with acute on chronic diastolic CHF -Diuresing very effectively on current regimen -Follow renal function on diuretics    Best practice (right click and "Reselect all SmartList Selections" daily)  Diet:  NPO Pain/Anxiety/Delirium protocol (if indicated): No VAP protocol (if indicated): Not indicated DVT prophylaxis: SCD GI prophylaxis: N/A Glucose control:  SSI Yes Central venous access:  N/A Arterial line:  N/A Foley:  N/A Mobility:  bed rest  PT consulted: N/A Last date of multidisciplinary goals of care discussion [pending] Code Status:  full code Disposition: Progressive Care  Labs   CBC: Recent Labs  Lab 01/11/21 0039 01/12/21 0048 01/12/21 1333 01/13/21 0717 01/16/21 0059 01/17/21 0024  WBC 7.3 4.5  --  5.5 6.6 4.2  NEUTROABS  --   --   --   --  5.0 2.8  HGB 12.1 11.3*  --  10.9* 12.3 12.3  HCT 39.3 36.1  --  34.5* 37.9 38.3  MCV 91.6 91.4  --  89.4 88.1 88.5  PLT PLATELET CLUMPING, SUGGEST RECOLLECTION OF SAMPLE IN CITRATE TUBE. PLATELET CLUMPS NOTED ON SMEAR, UNABLE TO ESTIMATE PLATELET CLUMPS NOTED ON SMEAR, UNABLE TO ESTIMATE PLATELET CLUMPS NOTED ON SMEAR, UNABLE TO ESTIMATE PLATELET CLUMPS NOTED ON SMEAR, UNABLE TO ESTIMATE PLATELET CLUMPING, SUGGEST RECOLLECTION OF SAMPLE IN CITRATE TUBE.    Basic Metabolic  Panel: Recent Labs  Lab 01/13/21 0717 01/14/21 0033 01/15/21 0026 01/16/21 0059 01/17/21 0024  NA 137 136 137 139 137  K 3.7 3.6 3.1* 3.4* 3.3*  CL 101 98 97* 96* 97*  CO2 31 32 32 29 30  GLUCOSE 132* 136* 136* 137* 134*  BUN 19 17 13 13 14   CREATININE 0.77 0.85 0.81 0.89 0.82  CALCIUM 8.5* 8.7* 8.9 9.0 8.6*  MG 1.8  --   --   --  1.9   GFR: Estimated Creatinine Clearance: 106.6 mL/min (by C-G formula based on SCr of 0.82 mg/dL). Recent Labs  Lab 01/12/21 0048 01/13/21 0717 01/16/21 0059 01/17/21 0024  WBC 4.5 5.5 6.6 4.2    Liver Function Tests: Recent Labs  Lab 01/13/21 0717 01/14/21 0033 01/15/21 0026 01/16/21 0059 01/17/21 0024  AST 27 27 25 30 27   ALT 15 15 16 18 18   ALKPHOS 56 61 57 64 65  BILITOT 5.0* 5.4* 4.3* 5.4* 5.7*  PROT 5.9* 6.0* 6.2* 6.8 6.6  ALBUMIN 2.8* 2.7* 2.8* 3.1* 3.0*   No results for  input(s): LIPASE, AMYLASE in the last 168 hours. Recent Labs  Lab 01/12/21 0048  AMMONIA 62*    ABG    Component Value Date/Time   PHART 7.476 (H) 01/16/2021 1421   PCO2ART 44.5 01/16/2021 1421   PO2ART 57.0 (L) 01/16/2021 1421   HCO3 32.5 (H) 01/16/2021 1421   TCO2 24 01/10/2021 0120   ACIDBASEDEF 6.0 (H) 03/15/2018 0148   O2SAT 88.9 01/16/2021 1421     Coagulation Profile: No results for input(s): INR, PROTIME in the last 168 hours.  Cardiac Enzymes: No results for input(s): CKTOTAL, CKMB, CKMBINDEX, TROPONINI in the last 168 hours.  HbA1C: Hgb A1c MFr Bld  Date/Time Value Ref Range Status  08/28/2017 10:26 AM 5.6 4.8 - 5.6 % Final    Comment:    (NOTE) Pre diabetes:          5.7%-6.4% Diabetes:              >6.4% Glycemic control for   <7.0% adults with diabetes   11/01/2016 05:15 AM 5.8 (H) 4.8 - 5.6 % Final    Comment:    (NOTE)         Pre-diabetes: 5.7 - 6.4         Diabetes: >6.4         Glycemic control for adults with diabetes: <7.0     CBG: Recent Labs  Lab 01/16/21 1710 01/16/21 2021 01/16/21 2349  01/17/21 0417 01/17/21 0818  GLUCAP 128* 131* 135* 136* 129*     Critical care time: 31 min     Baltazar Apo, MD, PhD 01/17/2021, 8:26 AM [facility] [phone] or if no answer before 7:00PM call [phone] For any issues after 7:00PM please call eLink [phone]

## 2021-01-18 DIAGNOSIS — Z66 Do not resuscitate: Secondary | ICD-10-CM

## 2021-01-18 DIAGNOSIS — I4891 Unspecified atrial fibrillation: Secondary | ICD-10-CM | POA: Diagnosis not present

## 2021-01-18 DIAGNOSIS — Z515 Encounter for palliative care: Secondary | ICD-10-CM | POA: Diagnosis not present

## 2021-01-18 DIAGNOSIS — Z7189 Other specified counseling: Secondary | ICD-10-CM | POA: Diagnosis not present

## 2021-01-18 LAB — CBC WITH DIFFERENTIAL/PLATELET
Abs Immature Granulocytes: 0.04 10*3/uL (ref 0.00–0.07)
Basophils Absolute: 0 10*3/uL (ref 0.0–0.1)
Basophils Relative: 1 %
Eosinophils Absolute: 0.1 10*3/uL (ref 0.0–0.5)
Eosinophils Relative: 1 %
HCT: 37.2 % (ref 36.0–46.0)
Hemoglobin: 12 g/dL (ref 12.0–15.0)
Immature Granulocytes: 1 %
Lymphocytes Relative: 15 %
Lymphs Abs: 0.7 10*3/uL (ref 0.7–4.0)
MCH: 28.4 pg (ref 26.0–34.0)
MCHC: 32.3 g/dL (ref 30.0–36.0)
MCV: 87.9 fL (ref 80.0–100.0)
Monocytes Absolute: 0.6 10*3/uL (ref 0.1–1.0)
Monocytes Relative: 12 %
Neutro Abs: 3.4 10*3/uL (ref 1.7–7.7)
Neutrophils Relative %: 70 %
Platelets: UNDETERMINED 10*3/uL (ref 150–400)
RBC: 4.23 MIL/uL (ref 3.87–5.11)
RDW: 20.7 % — ABNORMAL HIGH (ref 11.5–15.5)
WBC: 4.9 10*3/uL (ref 4.0–10.5)
nRBC: 0 % (ref 0.0–0.2)

## 2021-01-18 LAB — GLUCOSE, CAPILLARY
Glucose-Capillary: 113 mg/dL — ABNORMAL HIGH (ref 70–99)
Glucose-Capillary: 144 mg/dL — ABNORMAL HIGH (ref 70–99)
Glucose-Capillary: 134 mg/dL — ABNORMAL HIGH (ref 70–99)
Glucose-Capillary: 176 mg/dL — ABNORMAL HIGH (ref 70–99)
Glucose-Capillary: 150 mg/dL — ABNORMAL HIGH (ref 70–99)
Glucose-Capillary: 132 mg/dL — ABNORMAL HIGH (ref 70–99)

## 2021-01-18 LAB — COMPREHENSIVE METABOLIC PANEL
ALT: 22 U/L (ref 0–44)
AST: 47 U/L — ABNORMAL HIGH (ref 15–41)
Albumin: 3 g/dL — ABNORMAL LOW (ref 3.5–5.0)
Alkaline Phosphatase: 61 U/L (ref 38–126)
Anion gap: 9 (ref 5–15)
BUN: 14 mg/dL (ref 8–23)
CO2: 34 mmol/L — ABNORMAL HIGH (ref 22–32)
Calcium: 8.7 mg/dL — ABNORMAL LOW (ref 8.9–10.3)
Chloride: 95 mmol/L — ABNORMAL LOW (ref 98–111)
Creatinine, Ser: 0.92 mg/dL (ref 0.44–1.00)
GFR, Estimated: >60 mL/min (ref >60)
Glucose, Bld: 139 mg/dL — ABNORMAL HIGH (ref 70–99)
Potassium: 3.4 mmol/L — ABNORMAL LOW (ref 3.5–5.1)
Sodium: 138 mmol/L (ref 135–145)
Total Bilirubin: 5.2 mg/dL — ABNORMAL HIGH (ref 0.3–1.2)
Total Protein: 6.4 g/dL — ABNORMAL LOW (ref 6.5–8.1)

## 2021-01-18 MED ORDER — DILTIAZEM HCL ER COATED BEADS 180 MG PO CP24
360.0000 mg | ORAL_CAPSULE | Freq: Every day | ORAL | Status: DC
  Administered 2021-01-18 – 2021-01-22 (×5): 360 mg via ORAL
  Filled 2021-01-18 (×5): qty 2

## 2021-01-18 MED ORDER — GLIMEPIRIDE 1 MG PO TABS
1.0000 mg | ORAL_TABLET | Freq: Every day | ORAL | Status: DC
  Administered 2021-01-18 – 2021-01-22 (×5): 1 mg via ORAL
  Filled 2021-01-18 (×5): qty 1

## 2021-01-18 NOTE — Progress Notes (Signed)
Pt on home cpap for the night.

## 2021-01-18 NOTE — Progress Notes (Signed)
PROGRESS NOTE   Hannah Khan  YFR:[id number] DOB: [birth year] DOA: 01/10/2021 PCP: Hannah Amel, MD  Brief Narrative:   16 white female BMI 12 Cryptogenic cirrhosis + TCP NIDDM since 2017 OSA not on CPAP Fibromyalgia/MSK pain Colon adenoma status post polypectomy HFpEF A. fib CHADS2 score >4-not watchman or DCCV candidate Prior admissions for severe sepsis secondary to cellulitis lower extremity Recurrent admissions for RVR Last admission 03/14/2020-->03/18/2020 Streptococcus mitis bacteremia  She had recent shoulder surgery Hannah Khan in [facility] 12/30/2020-presented to [facility] ED acute altered mental status--had been discharged on oxycodone/Flexeril and using her CPAP as needed Developed incontinent urine continued to be somnolent confused On arrival EMS found her A. fib RVR rates 130s hypertensive 160 CXR negative for disease CT head negative for intracranial abnormality CT chest = bilateral pleural effusions pulmonary edema Initial Rx Rocephin Narcan Cardizem Ammonia found to be 78  Started on lactulose-when the dosage was dropped patient became more encephalopathic Her encephalopathy has not cleared and critical care has been consulted to assist with management as she is not wearing her CPAP for the full night and has been somewhat more combative with refused to have the same  Hospital-Problem based course  Toxic metabolic encephalopathy 2/2 hyperammonemia from cirrhosis Additionally recent prescription opiates, muscle relaxants from shoulder surgery Continue lactulose to 30 mg 3 times daily-mentation is clear Can resume oral meds A. fib RVR CHADS2 score >4 not a candidate for watchman or DCCV Patient not anticoagulated secondary to thrombocytopenia and history of variceal bleed metoprolol 50 twice daily, Cardizem 360 and titrate as needed DC Cardizem drip OSA on CPAP at night Continue BiPAP Okay to be off oxygen during the day as long as sat above 88 HFpEF dryest  weight is about 154 kg 171--> 151 kg currently Minus 12.8 follow-up L/since admit Resume Home Bumex = 2 mg twice daily  Stockings, feet above heart, continue diuresis Hannah Khan cirrhosis Hyperammonemia Lactulose as above Resume at a lower dose Aldactone 12.5 daily Monitor trends Diabetes mellitus type 2 Cannot afford Jardiance--we will start Amaryl later today Continue sliding scale coverage Recent right shoulder arthroplasty [facility] Hannah Khan Postop management was supposed to be 01/11/2021 OT to evaluate and treat given recent right shoulder surgery Lactobacillus UTI?  Without features of sepsis Continue Rocephin-discontinued 4/27-has no features of infection at this time repeat CBC a.m. Hypokalemia from prior to admission Resuming Aldactone Continue potassium supplementation Colon adenoma status post polypectomy Prior Streptococcus mitis bacteremia Needs outpatient surveillance of the same Moisture associated dermatitis from stool Turn frequently if able-ordered Hannah Khan's Mobilize with therapy   DVT prophylaxis: SCD Code Status: Full Family Communication: Discussed in detail with patient's husband at the bedside Hannah, Khan [phone]  [phone]   Also met with family who came in from out of town from [place] who ask about palliative care consult-I feel that that can be performed as an outpatient-patient has several chronic illnesses and depending on discussion amongst himself we can coordinate that if needed as an inpatient but can also be coordinated by PCP Disposition:  Status is: Inpatient Remains inpatient appropriate because:Hemodynamically unstable, Altered mental status, Ongoing diagnostic testing needed not appropriate for outpatient work up and Unsafe d/c plan Dispo: The patient is from: Home              Anticipated d/c is to: SNF probably on 5/3              Patient currently is not medically stable to d/c.   Difficult to  place patient No     Consultants:   None currently  Procedures: No  Antimicrobials: Rocephin through 4/27   Subjective:  Less confused Multiple family members at the bedside No chest pain No fever She is able to move her upper extremities but still somewhat tremulous She is having diarrhea  Objective: Vitals:   01/17/21 2300 01/17/21 2353 01/18/21 0334 01/18/21 0411  BP: 133/67  (!) 142/71   Pulse: 88 99 82 89  Resp: 14  15   Temp: 98.8 F (37.1 C)  98.6 F (37 C)   TempSrc: Axillary  Axillary   SpO2: 99% 99% 100% 99%  Weight:   (!) 151.2 kg   Height:        Intake/Output Summary (Last 24 hours) at 01/18/2021 1000 Last data filed at 01/18/2021 0335 Gross per 24 hour  Intake --  Output 1450 ml  Net -1450 ml   Filed Weights   01/16/21 0552 01/17/21 0420 01/18/21 0334  Weight: (!) 155.1 kg (!) 151.1 kg (!) 151.2 kg    Examination:  Oriented Flap present Chest clear no rales rhonchi Abdomen obese-meaningful exam difficult to interpret Some lower extremity edema has stockings in place Neurologically is intact and can move 4 limbs equally although is somewhat weak and debilitated   Data Reviewed: personally reviewed  171--> 151 kg Minus 16.6 L/since admit  BUN/creatinine 13/0.8 Potassium 3.4 Chloride 96 Clumped platelets  CBG 113--143  Radiology Studies: DG CHEST PORT 1 VIEW  Result Date: 01/16/2021 CLINICAL DATA:  Pneumonia evaluation EXAM: PORTABLE CHEST 1 VIEW COMPARISON:  01/10/2021 FINDINGS: Evaluation limited by body habitus and portable technique. Patient is also rotated. Probable bilateral pleural effusions with adjacent atelectasis. Pulmonary vascular congestion. Cardiomegaly. IMPRESSION: Small bilateral pleural effusions with adjacent atelectasis. Pulmonary vascular congestion. Cardiomegaly. Electronically Signed   By: Macy Mis M.D.   On: 01/16/2021 16:57    Scheduled Meds: . bumetanide  2 mg Oral Daily  . insulin aspart  0-9 Units Subcutaneous Q4H  .  lactulose  30 g Oral TID  . lidocaine  1 patch Transdermal Q24H  . metoprolol tartrate  50 mg Oral BID  . potassium chloride  20 mEq Oral Daily  . spironolactone  12.5 mg Oral Daily   Continuous Infusions: . diltiazem (CARDIZEM) infusion 10 mg/hr (01/17/21 1700)    LOS: 8 days   Time spent: [place], MD [facility] To contact the attending provider between 7A-7P or the covering provider during after hours 7P-7A, please log into the web site [email or web address] and access using universal [place] password for that web site. If you do not have the password, please call the hospital operator.  01/18/2021, 10:00 AM

## 2021-01-18 NOTE — Progress Notes (Signed)
Chaplain responded to a call from patient's nurse that patient would be better with a visit from the [place]. Chaplain entered patient's room and patient's husband was bedside. Patient was worried about dying--does not want to leave her grown children or her husband. Patient's children do not get along with patient's husband and is causing a rift in the family. Son is here from [place] to visit with his mother. Chaplain prayed with patient and her husband and patient asked Chaplain to return when her children were present. Chaplain is available when needed.    01/18/21 1100  Clinical Encounter Type  Visited With Patient and family together  Visit Type Spiritual support  Referral From Nurse  Consult/Referral To Chaplain  Spiritual Encounters  Spiritual Needs Prayer;Emotional  Stress Factors  Patient Stress Factors Family relationships;Health changes;Major life changes  Family Stress Factors Family relationships;Major life changes;Health changes

## 2021-01-18 NOTE — Progress Notes (Signed)
PT Cancellation Note  Patient Details Name: Hannah Khan MRN: [id number] DOB: [birth year]   Cancelled Treatment:    Reason Eval/Treat Not Completed: Patient's level of consciousness;Other (comment). Nursing reports pt sleepy but able to arouse. However when awake processing very slow and pt not able to follow and assist with any mobility. Will continue to follow.    Shary Decamp [facility] 01/18/2021, 1:54 PM [place] Pager [phone] Office [phone]

## 2021-01-18 NOTE — Progress Notes (Signed)
Pt becoming increasingly lethargic this afternoon., unable to form complete sentences and nods off often during conversation.  Dr. Verlon Au made aware. Orders noted for continuous BiPAP.

## 2021-01-18 NOTE — Progress Notes (Signed)
OT Cancellation Note  Patient Details Name: KRISTEE ANGUS MRN: [id number] DOB: 07/10/1956   Cancelled Treatment:    Reason Eval/Treat Not Completed: Fatigue/lethargy limiting ability to participate;Other (comment) Per chart review pt very lethargic, spoke with RN who reported that pts level of arousal was currently unchanged, will hold off on OT session for today and check back as time allows.   Corinne Ports K., COTA/L Acute Rehabilitation Services [phone]   Precious Haws 01/18/2021, 2:40 PM

## 2021-01-18 NOTE — Progress Notes (Signed)
Palliative Medicine Inpatient Consult Note  Reason for consult:  Goals of Care  HPI:  Per Pulmonology Note -->  65 year old morbidly obese (BMI 51) woman with cryptogenic cirrhosis, diabetes type 2, fibromyalgia and chronic pain on narcotics, hypertension and atrial fibrillation (often complicated by RVR) with chronic diastolic CHF, OSA with poor CPAP compliance.  She has prior recent admissions for strep mitis bacteremia, lower extremity cellulitis, RVR.  She underwent recent shoulder surgery 12/30/2020, prescribed oxycodone and Flexeril.  Admitted now 01/10/2021 with encephalopathy, hypersomnolence, RVR.  CT head reassuring.  Ammonia 78 --> 62.  Chest x-ray unremarkable, CT chest with atelectasis, some small bilateral effusions.  Treated with diltiazem, empiric ceftriaxone, lactulose.  Narcotics and Flexeril were both held.  She had some improvement in mentation though this a  Clinical Assessment/Goals of Care:  *Please note that this is a verbal dictation therefore any spelling or grammatical errors are due to the "[place]" system interpretation.  I have reviewed medical records including EPIC notes, labs and imaging, received report from bedside RN, assessed the patient who was lying in bed altered and tangential in conversation.    I met with Clydia Llano, Cheron Schaumann, Alla German, and Kathe Mariner this evening to further discuss diagnosis prognosis, GOC, EOL wishes, disposition and options.   I introduced Palliative Medicine as specialized medical care for people living with serious illness. It focuses on providing relief from the symptoms and stress of a serious illness. The goal is to improve quality of life for both the patient and the family.  Jozee is originally from [place].  He presently lives in [place], [place].  She has been married twice and shares 2 children with her first husband and 2 stepchildren from her second husband.  She has been married to  [place] since 2007.  She shares that she was about to retire this week from [employer] where she is worked for the past 15 years.  She shares that she has left her job throughout the years.  She is a woman of faith and practices within Christianity though she is nondenominational.  Per conversation with patient's daughter Logen Fowle has been struggling with mobility for well over a month now.  She has struggled to get to work and been dropped off at the closest door to her office.  Janett Billow expressed some concerns about the care that Madysen's husband Coralyn Mark is providing her sharing that he will only do things for her "when it is convenient for him".  In regards to basic activities of daily living for some time Airis has been struggling with hygiene in terms of bathing and bathroom and herself, brushing her hair.  She has been using a walker in the home environment and moving very slowly with that.  Michelena has never completed advanced directives though she and her family would be interested to do so while she is here.  Hanh's family and I agree that this evening she does not seem in her right state of mind to complete advanced directives therefore this is something that I will need to defer to the morning.    Concepts specific to code status, artifical feeding and hydration, continued IV antibiotics and rehospitalization was had.  At the present time patient is full code and again she is not coherent enough to make this decision therefore tomorrow we will address this more comprehensively.  I have been informed by nursing staff as well as patient's family that Venna is much more clear  minded in the morning hours.  Makiyla shares with me that her brother had passed of very similar complications associated with cirrhosis in November of this past year.  She is tearful towards me and her family and does seem to understand the severity of her situation to some degree despite her encephalopathy.  I reviewed with Phallon's  family that it is very worry some in terms of her present situation that she has been in the hospital for 8 days and continues oscillate between stable and unstable.  Patient's children and stepchildren appear to be very much in the know about the present situation and their personal objectives for the meeting tomorrow are to address CODE STATUS.  Patient's children recognize that if Suzannah undergoes a cardiopulmonary resuscitation event it will have devastating consequences.    Mychelle's family and I have both agreed to coordinate a meeting with all family members who would like to be present tomorrow at 9 AM.  I shared that my colleague Vinie Sill will be taking over to help care for [facility] during this very tender time.  I have requested Dr. Verlon Au and Dr. Erin Fulling to be present during this conversation to at the very least provide their medical input on Dhruti's long-term prognosis.  Discussed the importance of continued conversation with family and their  medical providers regarding overall plan of care and treatment options, ensuring decisions are within the context of the patients values and GOCs.  Decision Maker: Safiyah Cisney (spouse) [phone]  SUMMARY OF RECOMMENDATIONS   Full code - Pllan to address this in greater detail during tomorrow's meeting. Patients children have been in favor of DNR though husband is primary decision maker and has not agreed to this  Advanced directives need to be completed if patient is coherent enough to do so  Plan for meeting at 9 AM tomorrow to further discuss prognosis and goals of care  Objectives for this meeting include a comprehensive medical update by both Dr. Verlon Au and Dr. Erin Fulling, discussion of [place], and completion of advance directives  Appreciate ongoing chaplaincy support throughout hospitalization  I will not be present tomorrow though my colleague Vinie Sill will be taking over Agripina's care  Code Status/Advance Care  Planning: FULL CODE   Palliative Prophylaxis:   Oral care, turn every 2 hours, delirium precautions  Additional Recommendations (Limitations, Scope, Preferences):  Continue current scope of care for the time being   Psycho-social/Spiritual:   Desire for further Chaplaincy support:  Yes  Additional Recommendations: Education on chronic disease processes with a focus on NASH cirrhosis   Prognosis: Poor in the setting of multiple comorbidities, worsening debility, and recurrent e/o encephalopathy. Increase one year mortality risk  Discharge Planning: Discharge plan is uncertain  Vitals:   28/41/32 1200 01/18/21 1400  BP: 121/60 113/61  Pulse: 98 97  Resp: 16 15  Temp: 98.2 F (36.8 C)   SpO2: 95% 91%    Intake/Output Summary (Last 24 hours) at 01/18/2021 1827 Last data filed at 01/18/2021 0335 Gross per 24 hour  Intake --  Output 1450 ml  Net -1450 ml   Last Weight  Most recent update: 01/18/2021  3:35 AM   Weight  151.2 kg (333 lb 5.4 oz)             Gen:  Older caucasian F with AMS HEENT: Dry mucous membranes CV: Regular rate and abnormal rhythm  PULM: On 2LPM [place] ABD: soft/nontender  EXT: Generalized edema Neuro: Altered, tangiential  PPS: 30%  This conversation/these recommendations were discussed with patient primary care team, Dr. Verlon Au  Time In: 1740 Time Out: 1907 Total Time: 87 Greater than 50%  of this time was spent counseling and coordinating care related to the above assessment and plan.  [place] Team Team Cell Phone: [phone] Please utilize secure chat with additional questions, if there is no response within 30 minutes please call the above phone number  Palliative Medicine Team providers are available by phone from 7am to 7pm daily and can be reached through the team cell phone.  Should this patient require assistance outside of these hours, please call the patient's attending physician.

## 2021-01-18 NOTE — TOC Progression Note (Signed)
Transition of Care [facility]) - Progression Note    Patient Details  Name: Hannah Khan MRN: [id number] Date of Birth: October 12, 1955  Transition of [facility]) CM/SW Contact  Reece Agar, [place] Phone Number: 01/18/2021, 4:35 PM  Clinical Narrative:    CSW faxed out pt to [place] facilities to open up more offer choices. CSW will follow up.   Expected Discharge Plan: [place] Barriers to Discharge: Continued Medical Work up  Expected Discharge Plan and Services Expected Discharge Plan: [place] In-house Referral: Clinical Social Work   Post Acute Care Choice: [place] Living arrangements for the past 2 months: Single Family Home                                       Social Determinants of Health (SDOH) Interventions    Readmission Risk Interventions No flowsheet data found.

## 2021-01-18 NOTE — Progress Notes (Addendum)
NAME:  Hannah Khan, MRN:  [id number], DOB:  [birth year], LOS: 8 ADMISSION DATE:  01/10/2021, CONSULTATION DATE: 01/16/2021 REFERRING MD: Dr. Verlon Au, CHIEF COMPLAINT: Altered mental status  History of Present Illness:  65 year old morbidly obese (BMI 64) woman with cryptogenic cirrhosis, diabetes type 2, fibromyalgia and chronic pain on narcotics, hypertension and atrial fibrillation (often complicated by RVR) with chronic diastolic CHF, OSA with poor CPAP compliance.  She has prior recent admissions for strep mitis bacteremia, lower extremity cellulitis, RVR.  She underwent recent shoulder surgery 12/30/2020, prescribed oxycodone and Flexeril.  Admitted now 01/10/2021 with encephalopathy, hypersomnolence, RVR.  CT head reassuring.  Ammonia 78.  Chest x-ray unremarkable, CT chest with atelectasis, some small bilateral effusions.  Treated with diltiazem, empiric ceftriaxone, lactulose.  Narcotics and Flexeril were both held.  She had some improvement in mentation.  4/30 noted to be more somnolent, encephalopathic.  Lactulose dosing had been decreased 4/28.  ABG 7.47 / 44/ 57 .   Pertinent  Medical History   Past Medical History:  Diagnosis Date  . Anemia    hx of  . Arthritis    knees  . Diabetes mellitus without complication ([place])    type 2  . Fibromyalgia   . HTN (hypertension)   . Hx of colonic polyps    s/p partial colectomy  . Hyperlipidemia   . Joint pain    in knees and back spasms  . Left leg swelling    wear compression hose  . Liver cirrhosis secondary to NASH (nonalcoholic steatohepatitis) ([place]) dx nov 2014   had enlarged spleen also   . Low blood pressure    bp varies  . Morbid obesity ([place])   . Persistent atrial fibrillation ([place])   . Pneumonia 10/2016  . PONV (postoperative nausea and vomiting)    in past none recent  . Portal hypertension ([place])   . Sleep apnea    uses cpap setting of 14  . Spleen enlarged   . 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 [facility])     Significant Hospital Events: Including procedures, antibiotic start and stop dates in addition to other pertinent events   . 4/30 consulted for encephalopathy and marginal airway protection with some associated agitation.  Lactulose increased. . 5/1 mentation improved with Bipap and lactulose  Interim History / Subjective:   On bipap the last 2 nights. She does not tolerate it well and is requesting to use her home CPAP. More alert and awake this morning. Her husband, son and daugther in law are at the bedside.   Objective   Blood pressure (!) 142/71, pulse 89, temperature 98.6 F (37 C), temperature source Axillary, resp. rate 15, height 5' 7"  (1.702 m), weight (!) 151.2 kg, SpO2 99 %.        Intake/Output Summary (Last 24 hours) at 01/18/2021 0925 Last data filed at 01/18/2021 0335 Gross per 24 hour  Intake --  Output 1450 ml  Net -1450 ml   Filed Weights   01/16/21 0552 01/17/21 0420 01/18/21 0334  Weight: (!) 155.1 kg (!) 151.1 kg (!) 151.2 kg    Examination: General: Obese woman, awake, no acute distress HENT: Oropharynx clear, no upper airway secretions or noise.  PERRL Lungs: diminished breath sounds. No wheezing or rhonchi. Cardiovascular: Regular, distant, no murmur Abdomen: Nondistended, obese, positive bowel sounds Extremities: Compression stockings in place, warm Neuro: follows commands, moving all extremities   Labs/imaging that I have personally reviewed  (right click and "Reselect all SmartList Selections" daily)  CMP and CBC 5/2 ABG 4/20 Chest x-ray 4/30 with bibasilar atelectasis and effusions  CTA 01/10/21, 03/15/20 and CT abdomen/pelvis 03/16/20  Resolved Hospital Problem list     Assessment & Plan:   NASH cirrhosis with Acute toxic metabolic, hepatic encephalopathy.   -Improved with increase lactulose dose and change to PR. She is tolerating PO lactulose now. Continue PO and resume PR if unable to keep up with PO dosing. -Continue to avoid  sedating medications  OSA, obesity hypoventilation syndrome with associated hypoxemia.  At high risk hypercapnia but currently appears to be compensated based on ABG on 4/30 -Transition back to her home CPAP tonight. Has not slept well on the bipap. -Push pulmonary hygiene, add incentive spirometry -Wean FiO2, goal SPO2 just above 90%  A Fib  -Diltiazem infusion until she can get back to her oral medications  HTN with acute on chronic diastolic CHF -Diuresing very effectively on current regimen -Follow renal function on diuretics (bumex and spironolactone)  PCCM will sign off at this time as her mental status has improved and she will be transitioned back to her home CPAP. Please call if there are any further questions.   Best practice (right click and "Reselect all SmartList Selections" daily)  Diet:  NPO Pain/Anxiety/Delirium protocol (if indicated): No VAP protocol (if indicated): Not indicated DVT prophylaxis: SCD GI prophylaxis: N/A Glucose control:  SSI Yes Central venous access:  N/A Arterial line:  N/A Foley:  N/A Mobility:  bed rest  PT consulted: N/A Last date of multidisciplinary goals of care discussion: Patient's family is requesting a palliative care consult. Will discuss with primary team about placing the consult.  Code Status:  full code Disposition: Progressive Care  Labs   CBC: Recent Labs  Lab 01/12/21 0048 01/12/21 1333 01/13/21 0717 01/16/21 0059 01/17/21 0024 01/18/21 0050  WBC 4.5  --  5.5 6.6 4.2 4.9  NEUTROABS  --   --   --  5.0 2.8 3.4  HGB 11.3*  --  10.9* 12.3 12.3 12.0  HCT 36.1  --  34.5* 37.9 38.3 37.2  MCV 91.4  --  89.4 88.1 88.5 87.9  PLT PLATELET CLUMPS NOTED ON SMEAR, UNABLE TO ESTIMATE PLATELET CLUMPS NOTED ON SMEAR, UNABLE TO ESTIMATE PLATELET CLUMPS NOTED ON SMEAR, UNABLE TO ESTIMATE PLATELET CLUMPS NOTED ON SMEAR, UNABLE TO ESTIMATE PLATELET CLUMPING, SUGGEST RECOLLECTION OF SAMPLE IN CITRATE TUBE. PLATELET CLUMPS NOTED ON SMEAR,  UNABLE TO ESTIMATE    Basic Metabolic Panel: Recent Labs  Lab 01/13/21 0717 01/14/21 0033 01/15/21 0026 01/16/21 0059 01/17/21 0024 01/18/21 0050  NA 137 136 137 139 137 138  K 3.7 3.6 3.1* 3.4* 3.3* 3.4*  CL 101 98 97* 96* 97* 95*  CO2 31 32 32 29 30 34*  GLUCOSE 132* 136* 136* 137* 134* 139*  BUN 19 17 13 13 14 14   CREATININE 0.77 0.85 0.81 0.89 0.82 0.92  CALCIUM 8.5* 8.7* 8.9 9.0 8.6* 8.7*  MG 1.8  --   --   --  1.9  --    GFR: Estimated Creatinine Clearance: 95 mL/min (by C-G formula based on SCr of 0.92 mg/dL). Recent Labs  Lab 01/13/21 0717 01/16/21 0059 01/17/21 0024 01/18/21 0050  WBC 5.5 6.6 4.2 4.9    Liver Function Tests: Recent Labs  Lab 01/14/21 0033 01/15/21 0026 01/16/21 0059 01/17/21 0024 01/18/21 0050  AST 27 25 30 27  47*  ALT 15 16 18 18 22   ALKPHOS 61 57 64 65 61  BILITOT 5.4*  4.3* 5.4* 5.7* 5.2*  PROT 6.0* 6.2* 6.8 6.6 6.4*  ALBUMIN 2.7* 2.8* 3.1* 3.0* 3.0*   No results for input(s): LIPASE, AMYLASE in the last 168 hours. Recent Labs  Lab 01/12/21 0048  AMMONIA 62*    ABG    Component Value Date/Time   PHART 7.476 (H) 01/16/2021 1421   PCO2ART 44.5 01/16/2021 1421   PO2ART 57.0 (L) 01/16/2021 1421   HCO3 32.5 (H) 01/16/2021 1421   TCO2 24 01/10/2021 0120   ACIDBASEDEF 6.0 (H) 03/15/2018 0148   O2SAT 88.9 01/16/2021 1421     Coagulation Profile: No results for input(s): INR, PROTIME in the last 168 hours.  Cardiac Enzymes: No results for input(s): CKTOTAL, CKMB, CKMBINDEX, TROPONINI in the last 168 hours.  HbA1C: Hgb A1c MFr Bld  Date/Time Value Ref Range Status  08/28/2017 10:26 AM 5.6 4.8 - 5.6 % Final    Comment:    (NOTE) Pre diabetes:          5.7%-6.4% Diabetes:              >6.4% Glycemic control for   <7.0% adults with diabetes   11/01/2016 05:15 AM 5.8 (H) 4.8 - 5.6 % Final    Comment:    (NOTE)         Pre-diabetes: 5.7 - 6.4         Diabetes: >6.4         Glycemic control for adults with diabetes:  <7.0     CBG: Recent Labs  Lab 01/17/21 1649 01/17/21 2008 01/17/21 2343 01/18/21 0406 01/18/21 0811  GLUCAP 126* 162* 143* 113* [place], MD [facility] Office: [phone]   See Amion for personal pager PCCM on call pager [phone] until 7pm. Please call El[place] 7p-7a. [phone]

## 2021-01-19 DIAGNOSIS — I4891 Unspecified atrial fibrillation: Secondary | ICD-10-CM | POA: Diagnosis not present

## 2021-01-19 DIAGNOSIS — Z789 Other specified health status: Secondary | ICD-10-CM

## 2021-01-19 DIAGNOSIS — Z7189 Other specified counseling: Secondary | ICD-10-CM | POA: Diagnosis not present

## 2021-01-19 DIAGNOSIS — Z515 Encounter for palliative care: Secondary | ICD-10-CM | POA: Diagnosis not present

## 2021-01-19 DIAGNOSIS — K7581 Nonalcoholic steatohepatitis (NASH): Secondary | ICD-10-CM | POA: Diagnosis not present

## 2021-01-19 LAB — COMPREHENSIVE METABOLIC PANEL
ALT: 19 U/L (ref 0–44)
AST: 30 U/L (ref 15–41)
Albumin: 2.8 g/dL — ABNORMAL LOW (ref 3.5–5.0)
Alkaline Phosphatase: 64 U/L (ref 38–126)
Anion gap: 11 (ref 5–15)
BUN: 12 mg/dL (ref 8–23)
CO2: 29 mmol/L (ref 22–32)
Calcium: 8.7 mg/dL — ABNORMAL LOW (ref 8.9–10.3)
Chloride: 96 mmol/L — ABNORMAL LOW (ref 98–111)
Creatinine, Ser: 0.83 mg/dL (ref 0.44–1.00)
GFR, Estimated: >60 mL/min (ref >60)
Glucose, Bld: 133 mg/dL — ABNORMAL HIGH (ref 70–99)
Potassium: 2.7 mmol/L — CL (ref 3.5–5.1)
Sodium: 136 mmol/L (ref 135–145)
Total Bilirubin: 5.8 mg/dL — ABNORMAL HIGH (ref 0.3–1.2)
Total Protein: 6.4 g/dL — ABNORMAL LOW (ref 6.5–8.1)

## 2021-01-19 LAB — CBC WITH DIFFERENTIAL/PLATELET
Abs Immature Granulocytes: 0.04 10*3/uL (ref 0.00–0.07)
Basophils Absolute: 0.1 10*3/uL (ref 0.0–0.1)
Basophils Relative: 1 %
Eosinophils Absolute: 0.1 10*3/uL (ref 0.0–0.5)
Eosinophils Relative: 1 %
HCT: 36.5 % (ref 36.0–46.0)
Hemoglobin: 12 g/dL (ref 12.0–15.0)
Immature Granulocytes: 1 %
Lymphocytes Relative: 17 %
Lymphs Abs: 0.9 10*3/uL (ref 0.7–4.0)
MCH: 28.7 pg (ref 26.0–34.0)
MCHC: 32.9 g/dL (ref 30.0–36.0)
MCV: 87.3 fL (ref 80.0–100.0)
Monocytes Absolute: 0.6 10*3/uL (ref 0.1–1.0)
Monocytes Relative: 13 %
Neutro Abs: 3.4 10*3/uL (ref 1.7–7.7)
Neutrophils Relative %: 67 %
Platelets: 30 10*3/uL — ABNORMAL LOW (ref 150–400)
RBC: 4.18 MIL/uL (ref 3.87–5.11)
RDW: 20.4 % — ABNORMAL HIGH (ref 11.5–15.5)
WBC: 5.1 10*3/uL (ref 4.0–10.5)
nRBC: 0 % (ref 0.0–0.2)

## 2021-01-19 LAB — GLUCOSE, CAPILLARY
Glucose-Capillary: 125 mg/dL — ABNORMAL HIGH (ref 70–99)
Glucose-Capillary: 120 mg/dL — ABNORMAL HIGH (ref 70–99)
Glucose-Capillary: 163 mg/dL — ABNORMAL HIGH (ref 70–99)
Glucose-Capillary: 147 mg/dL — ABNORMAL HIGH (ref 70–99)
Glucose-Capillary: 128 mg/dL — ABNORMAL HIGH (ref 70–99)
Glucose-Capillary: 134 mg/dL — ABNORMAL HIGH (ref 70–99)

## 2021-01-19 LAB — MAGNESIUM: Magnesium: 2 mg/dL (ref 1.7–2.4)

## 2021-01-19 MED ORDER — RIFAMPIN 300 MG PO CAPS
300.0000 mg | ORAL_CAPSULE | Freq: Every day | ORAL | Status: DC
  Administered 2021-01-19: 300 mg via ORAL
  Filled 2021-01-19: qty 1

## 2021-01-19 MED ORDER — RIFAXIMIN 550 MG PO TABS
550.0000 mg | ORAL_TABLET | Freq: Two times a day (BID) | ORAL | Status: DC
  Administered 2021-01-19 – 2021-01-22 (×6): 550 mg via ORAL
  Filled 2021-01-19 (×7): qty 1

## 2021-01-19 MED ORDER — POTASSIUM CHLORIDE 10 MEQ/100ML IV SOLN
10.0000 meq | INTRAVENOUS | Status: AC
  Administered 2021-01-19 (×6): 10 meq via INTRAVENOUS
  Filled 2021-01-19 (×6): qty 100

## 2021-01-19 MED ORDER — POTASSIUM CHLORIDE CRYS ER 20 MEQ PO TBCR
40.0000 meq | EXTENDED_RELEASE_TABLET | Freq: Once | ORAL | Status: AC
  Administered 2021-01-19: 40 meq via ORAL
  Filled 2021-01-19: qty 2

## 2021-01-19 NOTE — Progress Notes (Signed)
PT Cancellation Note  Patient Details Name: Hannah Khan MRN: [id number] DOB: 05/10/1956   Cancelled Treatment:    Reason Eval/Treat Not Completed: Other (comment) (pt sleeping on cpap and discussed with RN. Will hold until after palliative meeting this am to determine POC)   [place] 01/19/2021, 7:43 AM  [name], PT Acute Rehabilitation Services Pager: [phone] Office: [phone]

## 2021-01-19 NOTE — Progress Notes (Signed)
Occupational Therapy Treatment Patient Details Name: Hannah Khan MRN: [id number] DOB: 09-25-1955 Today's Date: 01/19/2021    History of present illness Pt is a 65 y.o. female with recent R shoulder sx (12/30/20), now admitted 01/10/21 with AMS, somnolence. Pt with acute hypoxic respiratory failure, acute metabolic encephalopathy; potential polypharmacy. Head CT negative for acute findings. Liver failure. PMH includes afib, DM2, OSA, cirrhosis, obesity.   OT comments  Pt with a decline over past few days and therefore has not had therapy.  Pt much more alert and participatory today. Pt stood x2 with mod assist x2 and sat on EOB for appx 8 minutes. Pt participated in grooming tasks with hand over hand assist. Pt requiring mod to total assist with most adls at this point which is a significant change from admission.  Goals remain the same as decline was for a few day and pt more alert today. Will downgrade goals if needed depending on how pt does over the week.  Will continue with focus on basic education on adl importance (pt not performing adls regularly preadmit) as well as working toward functional transfers to the [facility].   Follow Up Recommendations  SNF;Supervision/Assistance - 24 hour    Equipment Recommendations  None recommended by OT    Recommendations for Other Services      Precautions / Restrictions Precautions Precautions: Fall Precaution Comments: flexiseal Restrictions Weight Bearing Restrictions: No RUE Weight Bearing: Partial weight bearing Other Position/Activity Restrictions: Per patient report - can be partial weight bearing, but not full weight bearing yet; no ROM restrictions that she is aware, has a paper at home with exercises from Dr. Berenice Primas [place]       Mobility Bed Mobility Overal bed mobility: Needs Assistance Bed Mobility: Rolling;Sit to Supine Rolling: Max assist;+2 for physical assistance Sidelying to sit: Mod assist;HOB elevated Supine to sit: Max  assist;+2 for physical assistance Sit to supine: Max assist;+2 for physical assistance   General bed mobility comments: max +2 to pivot from supine to sit with HOB 35 degrees with assist to bring legs to EOB and elevate trunk, use of pad to scoot to EOB. Return to supine physical assist to lift legs to surface and control trunk. Total +2 to slide toward [facility]    Transfers Overall transfer level: Needs assistance Equipment used: Rolling walker (2 wheeled) Transfers: Sit to/from Stand Sit to Stand: Mod assist;+2 physical assistance         General transfer comment: mod +2 to stand from EOB x 2 trials. side stepping toward [facility] toward left pt moving right foot but required physical assist for weight shifting and movign left foot    Balance Overall balance assessment: Needs assistance Sitting-balance support: No upper extremity supported;Feet supported Sitting balance-Leahy Scale: Poor Sitting balance - Comments: EOb with min-minguard assist with cues for posture and looking up. EOB grossly 8 min total   Standing balance support: Bilateral upper extremity supported Standing balance-Leahy Scale: Poor Standing balance comment: use of RW with multimodal cues to use LUE to grasp and use RW, left knee blocked                           ADL either performed or assessed with clinical judgement   ADL Overall ADL's : Needs assistance/impaired Eating/Feeding: Maximal assistance;Sitting   Grooming: Oral care;Wash/dry face;Brushing hair;Maximal assistance;Sitting Grooming Details (indicate cue type and reason): Pt required hand over hand assist to comb hair and to brush teeth this  am.  Pt distracted and dropping items when in hand.  Pt could not reach overhead this am with LUE.  RUE limited due to surgery. Upper Body Bathing: Maximal assistance;Sitting   Lower Body Bathing: Total assistance;Sit to/from stand   Upper Body Dressing : Total assistance;Sitting   Lower Body Dressing: Total  assistance;Sit to/from stand Lower Body Dressing Details (indicate cue type and reason): Pt stood x2 today with mod assist x2 but unable to let go of walker. Toilet Transfer: Maximal assistance;+2 for physical assistance;BSC Toilet Transfer Details (indicate cue type and reason): Pt unable to weight shift to the R to move the LLE. Toileting- Clothing Manipulation and Hygiene: Total assistance;Sit to/from stand Toileting - Clothing Manipulation Details (indicate cue type and reason): pt with urinary incontinence     Functional mobility during ADLs: Maximal assistance;+2 for physical assistance General ADL Comments: Pt with significant decline but more awake and alert today following commands.  Pt mod to total assist with all adls at this point.     Vision   Vision Assessment?: Vision impaired- to be further tested in functional context Additional Comments: Pt states she has difficulty seeing since she came to hospital.  Pt could locate family members in room. Wears glasses at all times.   Perception     Praxis      Cognition Arousal/Alertness: Awake/alert Behavior During Therapy: Flat affect Overall Cognitive Status: Impaired/Different from baseline Area of Impairment: Attention;Memory;Following commands;Safety/judgement;Awareness;Problem solving                 Orientation Level: Situation Current Attention Level: Focused Memory: Decreased short-term memory Following Commands: Follows one step commands consistently Safety/Judgement: Decreased awareness of deficits Awareness: Intellectual Problem Solving: Slow processing;Decreased initiation;Requires verbal cues;Requires tactile cues;Difficulty sequencing General Comments: pt able to state family members names in room        Exercises     Shoulder Instructions       General Comments Pt with more participation today and more alert. Pt continues to required a great amount of assist but motivated to participate today.     Pertinent Vitals/ Pain       Pain Assessment: Faces Faces Pain Scale: No hurt  Home Living                                          Prior Functioning/Environment              Frequency  Min 2X/week        Progress Toward Goals  OT Goals(current goals can now be found in the care plan section)  Progress towards OT goals: Not progressing toward goals - comment (had decline but improved today from yesterday)  Acute Rehab OT Goals Patient Stated Goal: pt and husband agree ST rehab is optimal OT Goal Formulation: With patient/family Time For Goal Achievement: 01/28/21 Potential to Achieve Goals: Good ADL Goals Pt Will Perform Grooming: with min guard assist;standing Pt Will Perform Upper Body Bathing: with adaptive equipment;sitting;with supervision Pt Will Perform Lower Body Bathing: with supervision;with adaptive equipment;sit to/from stand Pt Will Perform Upper Body Dressing: with set-up;sitting Pt Will Perform Lower Body Dressing: with mod assist;sit to/from stand Pt Will Transfer to Toilet: with supervision;ambulating;bedside commode Pt Will Perform Toileting - Clothing Manipulation and hygiene: with supervision;with adaptive equipment;sit to/from stand  Plan Discharge plan needs to be updated    Co-evaluation  PT/OT/SLP Co-Evaluation/Treatment: Yes Reason for Co-Treatment: Complexity of the patient's impairments (multi-system involvement) PT goals addressed during session: Mobility/safety with mobility OT goals addressed during session: ADL's and self-care      AM-PAC OT "6 Clicks" Daily Activity     Outcome Measure   Help from another person eating meals?: A Lot Help from another person taking care of personal grooming?: A Lot Help from another person toileting, which includes using toliet, bedpan, or urinal?: Total Help from another person bathing (including washing, rinsing, drying)?: A Lot Help from another person to put on and  taking off regular upper body clothing?: A Lot Help from another person to put on and taking off regular lower body clothing?: Total 6 Click Score: 10    End of Session Equipment Utilized During Treatment: Rolling walker  OT Visit Diagnosis: Unsteadiness on feet (R26.81);Other abnormalities of gait and mobility (R26.89);Pain;Muscle weakness (generalized) (M62.81)   Activity Tolerance Patient limited by fatigue   Patient Left in bed;with call bell/phone within reach;with bed alarm set;with family/visitor present   Nurse Communication Mobility status        Time: 7944-4619 OT Time Calculation (min): 30 min  Charges: OT General Charges $OT Visit: 1 Visit OT Treatments $Self Care/Home Management : 8-22 mins   Glenford Peers 01/19/2021, 11:01 AM

## 2021-01-19 NOTE — Progress Notes (Signed)
PROGRESS NOTE   Hannah Khan  OZH:[id number] DOB: [birth year] DOA: 01/10/2021 PCP: Lujean Amel, MD  Brief Narrative:   70 white female BMI 84 Cryptogenic cirrhosis + TCP NIDDM since 2017 OSA not on CPAP Fibromyalgia/MSK pain Colon adenoma status post polypectomy HFpEF A. fib CHADS2 score >4-not watchman or DCCV candidate Prior admissions for severe sepsis secondary to cellulitis lower extremity Recurrent admissions for RVR Last admission 03/14/2020-->03/18/2020 Streptococcus mitis bacteremia  She had recent shoulder surgery Dr. Berenice Primas in [facility] 12/30/2020-presented to [facility] ED acute altered mental status--had been discharged on oxycodone/Flexeril and using her CPAP as needed Developed incontinent urine continued to be somnolent confused On arrival EMS found her A. fib RVR rates 130s hypertensive 160 CXR negative for disease CT head negative for intracranial abnormality CT chest = bilateral pleural effusions pulmonary edema Initial Rx Rocephin Narcan Cardizem Ammonia found to be 78  Started on lactulose-when the dosage was dropped patient became more encephalopathic Her encephalopathy waxed and waned during hospital stay-critical care saw the patient-she has become a little bit more awake and alert over the past 24 hours She will be discharged to skilled facility in the next several days once her electrolytes corrected and she is reliably coherent    Hospital-Problem based course  Toxic metabolic encephalopathy 2/2 hyperammonemia from cirrhosis Additionally recent prescription opiates, muscle relaxants from shoulder surgery Continue lactulose to 30 mg 3 times daily- Add rifampicin 750 twice daily 5/3 mentation has waxed and waned over several days and we need clarity for at least 24 to 48 hours before we can discharge her A. fib RVR CHADS2 score >4 not a candidate for watchman or DCCV not anticoagulated 2/2 thrombocytopenia/ariceal bleed metoprolol 50 twice daily,  Cardizem 360 and titrate as needed Heart rate is better controlled OSA on CPAP at night Continue positive pressure ventilation whenever sleeping Okay to be off oxygen during the day as long as sat above 88 HFpEF dryest weight is about 154 kg 171--> 151 kg currently Minus 10.8 follow-up L/since admit Resume Home Bumex = 2 mg twice daily  Stockings, feet above heart, continue diuresis Moderate hypokalemia Replacing with runs of potassium Check a.m. mag   Nash cirrhosis Hyperammonemia Lactulose as above Resume at a lower dose Aldactone 12.5 daily Monitor trends Diabetes mellitus type 2 DC Jardiance given affordability issues Continue sliding scale coverage Recent right shoulder arthroplasty [facility] Dr. Berenice Primas Postop management was supposed to be 01/11/2021 OT to evaluate and treat given recent right shoulder surgery Lactobacillus UTI?  Without features of sepsis Continue Rocephin-discontinued 4/27-has no features of infection at this time repeat CBC a.m. Hypokalemia from prior to admission Resuming Aldactone Continue potassium supplementation Colon adenoma status post polypectomy Prior Streptococcus mitis bacteremia Needs outpatient surveillance of the same Moisture associated dermatitis from stool Turn frequently if able-ordered Gerhardtt's Mobilize with therapy   DVT prophylaxis: SCD Code Status: DNR Family Communication: Discussed in detail with patient's husband at the bedside Caitrin, Pendergraph [phone]  [phone]  Discussed with entire family birefly at a.m. palliative care meeting Patient is able to participate She is confirmed DNR Disposition:  Status is: Inpatient Remains inpatient appropriate because:Hemodynamically unstable, Altered mental status, Ongoing diagnostic testing needed not appropriate for outpatient work up and Unsafe d/c plan Dispo: The patient is from: Home              Anticipated d/c is to: SNF and 24 to 48 hours depending on  stable mentation and electrolytes  Patient currently is not medically stable to d/c.   Difficult to place patient No    Consultants:   None currently  Procedures: No  Antimicrobials: Rocephin through 4/27   Subjective:  Alert no distress slightly tremulous   Objective: Vitals:   01/19/21 0000 01/19/21 0405 01/19/21 0723 01/19/21 1049  BP: 138/71 125/66 137/72 (!) 117/6  Pulse:  88 84 91  Resp:  15 14 16   Temp:  97.6 F (36.4 C)  98.4 F (36.9 C)  TempSrc:  Axillary  Oral  SpO2:  95% 96% 98%  Weight:  (!) 151.4 kg    Height:        Intake/Output Summary (Last 24 hours) at 01/19/2021 1544 Last data filed at 01/19/2021 1500 Gross per 24 hour  Intake 576.72 ml  Output 800 ml  Net -223.28 ml   Filed Weights   01/17/21 0420 01/18/21 0334 01/19/21 0405  Weight: (!) 151.1 kg (!) 151.2 kg (!) 151.4 kg    Examination:  Oriented x2-- quite forgetful still-cannot tell me her street address and has difficulty naming the town that she lives in [facility]) Chest clear no rales rhonchi Abdomen obese  Data Reviewed: personally reviewed  171--> 151 kg Minus 16.6 L/since admit  BUN/creatinine 13/0.8-->12/0.8 Potassium 3.4-->2.7 Chloride 96 Platelets 30  Hemoglobin 12.0, WBC 5.1  CBG 125-->132  Radiology Studies: No results found.  Scheduled Meds: . bumetanide  2 mg Oral Daily  . diltiazem  360 mg Oral Daily  . glimepiride  1 mg Oral Q lunch  . insulin aspart  0-9 Units Subcutaneous Q4H  . lactulose  30 g Oral TID  . lidocaine  1 patch Transdermal Q24H  . metoprolol tartrate  50 mg Oral BID  . potassium chloride  20 mEq Oral Daily  . rifaximin  550 mg Oral BID  . spironolactone  12.5 mg Oral Daily   Continuous Infusions:   LOS: 9 days   Time spent: [place], MD [facility] To contact the attending provider between 7A-7P or the covering provider during after hours 7P-7A, please log into the web site [email or web address] and  access using universal [place] password for that web site. If you do not have the password, please call the hospital operator.  01/19/2021, 3:44 PM

## 2021-01-19 NOTE — Progress Notes (Signed)
Palliative:  HPI: 65 year old morbidly obese (BMI 35) woman with cryptogenic cirrhosis, diabetes type 2, fibromyalgia and chronic pain on narcotics, hypertension and atrial fibrillation (often complicated by RVR) with chronic diastolic CHF, OSA with poor CPAP compliance. She has prior recent admissions for strep mitis bacteremia, lower extremity cellulitis, RVR. She underwent recent shoulder surgery 12/30/2020, prescribed oxycodone and Flexeril. Admitted now 01/10/2021 with encephalopathy, hypersomnolence, RVR. CT head reassuring. Ammonia 78 --> 62. Chest x-ray unremarkable, CT chest with atelectasis, some small bilateral effusions. Treated with diltiazem, empiric ceftriaxone, lactulose. Narcotics and Flexeril were both held. Hospitalization complicated by fluctuating mental status.   I met today at [place] bedside with her husband Coralyn Mark, daughter Janett Billow, son Merry Proud (his wife), brother Milbert Coulter, and Joslyn Hy (his wife). Dr. Verlon Au has spoken with them and Nakayla is fortunately more alert and able to contribute to goals of care. She was very tearful but able to express her wishes and family were all very supportive and aligned with her wishes. We completed MOST: DNR, limited interventions, determine use/limitation of antibiotics at time of infection, trial of IVF, NO feeding tube. Malissie was able to express her desire to continue treatment with hopes of having more time with family. She is fearful of leaving her husband and children and family were able to offer reassurance that they will be okay and take care of each other. She would not want her life prolonged artificially at end of life, in comatose state, or advanced dementia or consistently confused state. She does value quality of life. She names husband, Coralyn Mark, as primary HCPOA and daughter, Janett Billow, as secondary HCPOA. HCPOA/Living Will needs to be notarized. Macon also talks much of her brother who died recently from liver disease and Daphene saw his  suffering and does not want that for herself. Kathline also expressed some of her wishes for her funeral. Vianey was able to have meaningful discussion with all her family members and they were also very supportive to her throughout these difficult conversations. All in attendance agree with the decisions and path made today. Continue with interventions to optimize quality of life and prolong life for now.   All questions/concerns addressed. Emotional support.   Exam: Alert, more oriented today. Appropriately tearful. No distress. Morbidly obese. Breathing regular, unlabored. Used CPAP last night. Abd soft.   Plan: - MOST complete. Dnr decided. See above. - Living Will complete (needs to be notarized).  - Goals clear with hopes of improvement and more time with her family. If she declines towards end of life she does not want to suffer.   0102-7253 664 min  Vinie Sill, NP Palliative Medicine Team Pager [phone] (Please see [email or web address] for schedule) Team Phone [phone]    Greater than 50%  of this time was spent counseling and coordinating care related to the above assessment and plan

## 2021-01-19 NOTE — Progress Notes (Signed)
Physical Therapy Treatment Patient Details Name: Hannah Khan MRN: [id number] DOB: 1956-06-10 Today's Date: 01/19/2021    History of Present Illness Pt is a 65 y.o. female with recent R shoulder sx (12/30/20), now admitted 01/10/21 with AMS, somnolence. Pt with acute hypoxic respiratory failure, acute metabolic encephalopathy; potential polypharmacy. Head CT negative for acute findings. Liver failure. PMH includes afib, DM2, OSA, cirrhosis, obesity.    PT Comments    Pt awake with flat affect and multiple family members in room post palliative meeting. Pt able to assist with transfer to EOB, standing x 2 and return to bed. Pt with significant assist and decline in function since admission with D/C plan and goals updated appropriately. Will continue to follow and maximize function and tolerance as pt able to tolerate.      Follow Up Recommendations  SNF;Supervision/Assistance - 24 hour     Equipment Recommendations  Hospital bed    Recommendations for Other Services       Precautions / Restrictions Precautions Precautions: Fall Precaution Comments: flexiseal Restrictions Weight Bearing Restrictions: Yes RUE Weight Bearing: Partial weight bearing    Mobility  Bed Mobility Overal bed mobility: Needs Assistance Bed Mobility: Rolling;Sit to Supine Rolling: Max assist;+2 for physical assistance   Supine to sit: Max assist;+2 for physical assistance Sit to supine: Max assist;+2 for physical assistance   General bed mobility comments: max +2 to pivot from supine to sit with HOB 35 degrees with assist to bring legs to EOB and elevate trunk, use of pad to scoot to EOB. Return to supine physical assist to lift legs to surface and control trunk. Total +2 to slide toward [facility]    Transfers Overall transfer level: Needs assistance   Transfers: Sit to/from Stand Sit to Stand: Mod assist;+2 physical assistance         General transfer comment: mod +2 to stand from EOB x 2 trials.  side stepping toward [facility] toward left pt moving right foot but required physical assist for weight shifting and movign left foot  Ambulation/Gait             General Gait Details: Unable to attempt   Stairs             Wheelchair Mobility    Modified Rankin (Stroke Patients Only)       Balance Overall balance assessment: Needs assistance Sitting-balance support: No upper extremity supported;Feet supported Sitting balance-Leahy Scale: Poor Sitting balance - Comments: EOb with min-minguard assist with cues for posture and looking up. EOB grossly 8 min total   Standing balance support: Bilateral upper extremity supported Standing balance-Leahy Scale: Poor Standing balance comment: use of RW with multimodal cues to use LUE to grasp and use RW, left knee blocked                            Cognition Arousal/Alertness: Awake/alert Behavior During Therapy: Flat affect Overall Cognitive Status: Impaired/Different from baseline Area of Impairment: Orientation;Attention;Memory;Following commands;Problem solving                 Orientation Level: Disoriented to;Time;Situation Current Attention Level: Focused Memory: Decreased short-term memory Following Commands: Follows one step commands inconsistently;Follows one step commands with increased time     Problem Solving: Slow processing;Decreased initiation;Requires verbal cues;Requires tactile cues;Difficulty sequencing General Comments: pt able to state family members names in room      Exercises      General Comments  Pertinent Vitals/Pain Pain Assessment: Faces Faces Pain Scale: No hurt    Home Living                      Prior Function            PT Goals (current goals can now be found in the care plan section) Acute Rehab PT Goals Time For Goal Achievement: 02/02/21 Progress towards PT goals: Goals downgraded-see care plan    Frequency    Min 2X/week      PT  Plan Discharge plan needs to be updated;Frequency needs to be updated    Co-evaluation PT/OT/SLP Co-Evaluation/Treatment: Yes Reason for Co-Treatment: Complexity of the patient's impairments (multi-system involvement) PT goals addressed during session: Mobility/safety with mobility OT goals addressed during session: ADL's and self-care      AM-PAC PT "6 Clicks" Mobility   Outcome Measure  Help needed turning from your back to your side while in a flat bed without using bedrails?: Total Help needed moving from lying on your back to sitting on the side of a flat bed without using bedrails?: Total Help needed moving to and from a bed to a chair (including a wheelchair)?: Total Help needed standing up from a chair using your arms (e.g., wheelchair or bedside chair)?: Total Help needed to walk in hospital room?: Total Help needed climbing 3-5 steps with a railing? : Total 6 Click Score: 6    End of Session   Activity Tolerance: Patient limited by fatigue Patient left: in bed;with call bell/phone within reach;with family/visitor present Nurse Communication: Mobility status PT Visit Diagnosis: Other abnormalities of gait and mobility (R26.89);Difficulty in walking, not elsewhere classified (R26.2);Muscle weakness (generalized) (M62.81)     Time: 1518-3437 PT Time Calculation (min) (ACUTE ONLY): 26 min  Charges:  $Therapeutic Activity: 8-22 mins                     Jayleana Colberg P, PT Acute Rehabilitation Services Pager: [phone] Office: [place] 01/19/2021, 10:50 AM

## 2021-01-19 NOTE — Plan of Care (Signed)
  Problem: Clinical Measurements: Goal: Will remain free from infection Outcome: Progressing Goal: Respiratory complications will improve Outcome: Progressing Goal: Cardiovascular complication will be avoided Outcome: Progressing   Problem: Coping: Goal: Level of anxiety will decrease Outcome: Progressing   Problem: Elimination: Goal: Will not experience complications related to bowel motility Outcome: Progressing Goal: Will not experience complications related to urinary retention Outcome: Progressing   Problem: Pain Managment: Goal: General experience of comfort will improve Outcome: Progressing   Problem: Safety: Goal: Ability to remain free from injury will improve Outcome: Progressing   Problem: Skin Integrity: Goal: Risk for impaired skin integrity will decrease Outcome: Progressing

## 2021-01-19 NOTE — Plan of Care (Signed)

## 2021-01-20 LAB — COMPREHENSIVE METABOLIC PANEL
ALT: 22 U/L (ref 0–44)
AST: 33 U/L (ref 15–41)
Albumin: 2.8 g/dL — ABNORMAL LOW (ref 3.5–5.0)
Alkaline Phosphatase: 72 U/L (ref 38–126)
Anion gap: 6 (ref 5–15)
BUN: 13 mg/dL (ref 8–23)
CO2: 29 mmol/L (ref 22–32)
Calcium: 8.8 mg/dL — ABNORMAL LOW (ref 8.9–10.3)
Chloride: 102 mmol/L (ref 98–111)
Creatinine, Ser: 0.81 mg/dL (ref 0.44–1.00)
GFR, Estimated: >60 mL/min (ref >60)
Glucose, Bld: 120 mg/dL — ABNORMAL HIGH (ref 70–99)
Potassium: 3.2 mmol/L — ABNORMAL LOW (ref 3.5–5.1)
Sodium: 137 mmol/L (ref 135–145)
Total Bilirubin: 7.3 mg/dL — ABNORMAL HIGH (ref 0.3–1.2)
Total Protein: 6.5 g/dL (ref 6.5–8.1)

## 2021-01-20 LAB — GLUCOSE, CAPILLARY
Glucose-Capillary: 103 mg/dL — ABNORMAL HIGH (ref 70–99)
Glucose-Capillary: 122 mg/dL — ABNORMAL HIGH (ref 70–99)
Glucose-Capillary: 123 mg/dL — ABNORMAL HIGH (ref 70–99)
Glucose-Capillary: 135 mg/dL — ABNORMAL HIGH (ref 70–99)
Glucose-Capillary: 148 mg/dL — ABNORMAL HIGH (ref 70–99)
Glucose-Capillary: 158 mg/dL — ABNORMAL HIGH (ref 70–99)

## 2021-01-20 LAB — CBC WITH DIFFERENTIAL/PLATELET
Abs Immature Granulocytes: 0.05 10*3/uL (ref 0.00–0.07)
Basophils Absolute: 0.1 10*3/uL (ref 0.0–0.1)
Basophils Relative: 1 %
Eosinophils Absolute: 0.1 10*3/uL (ref 0.0–0.5)
Eosinophils Relative: 2 %
HCT: 38.7 % (ref 36.0–46.0)
Hemoglobin: 12.9 g/dL (ref 12.0–15.0)
Immature Granulocytes: 1 %
Lymphocytes Relative: 17 %
Lymphs Abs: 1.1 10*3/uL (ref 0.7–4.0)
MCH: 29 pg (ref 26.0–34.0)
MCHC: 33.3 g/dL (ref 30.0–36.0)
MCV: 87 fL (ref 80.0–100.0)
Monocytes Absolute: 0.7 10*3/uL (ref 0.1–1.0)
Monocytes Relative: 10 %
Neutro Abs: 4.5 10*3/uL (ref 1.7–7.7)
Neutrophils Relative %: 69 %
Platelets: UNDETERMINED 10*3/uL (ref 150–400)
RBC: 4.45 MIL/uL (ref 3.87–5.11)
RDW: 20.6 % — ABNORMAL HIGH (ref 11.5–15.5)
WBC: 6.6 10*3/uL (ref 4.0–10.5)
nRBC: 0 % (ref 0.0–0.2)

## 2021-01-20 LAB — MAGNESIUM: Magnesium: 2 mg/dL (ref 1.7–2.4)

## 2021-01-20 NOTE — Plan of Care (Signed)

## 2021-01-20 NOTE — Progress Notes (Signed)
PROGRESS NOTE   Hannah Khan  GLO:[id number] DOB: 30-Jun-1956 DOA: 01/10/2021 PCP: Lujean Amel, MD  Brief Narrative:    Recent shoulder surgery Dr. Berenice Primas in [facility] 12/30/2020-presented to [facility] ED acute altered mental status--had been discharged on oxycodone/Flexeril and using her CPAP as needed On arrival EMS found her A. fib RVR rates 130s hypertensive 160   Hospital-Problem based course  Toxic metabolic encephalopathy 2/2 hyperammonemia from cirrhosis Additionally recent prescription opiates, muscle relaxants from shoulder surgery Continue lactulose-- tritrate Add rifampicin 5/3 Delirium precuations  A. fib RVR CHADS2 score >4 not a candidate for watchman or DCCV not anticoagulated 2/2 thrombocytopenia/ariceal bleed -metoprolol 50 twice daily, Cardizem 360 and titrate as needed Heart rate is better controlled  OSA on CPAP at night Continue positive pressure ventilation whenever sleeping Okay to be off oxygen during the day as long as sat above 88  HFpEF dryest weight is about 154 kg 171--> 151 kg currently Minus 10.8 follow-up L/since admit Resume Home Bumex = 2 mg twice daily   Cirrhosis with Hyperammonemia Lactulose as above -xifimin added  Diabetes mellitus type 2 Continue sliding scale coverage  Recent right shoulder arthroplasty [facility] Dr. Berenice Primas Postop management was supposed to be 01/11/2021 OT to evaluate and treat given recent right shoulder surgery  Hypokalemia  Resuming Aldactone Continue potassium supplementation  Prior Streptococcus mitis bacteremia Needs outpatient surveillance of the same  Moisture associated dermatitis from stool Turn frequently if able-ordered Gerhardtt's Mobilize with therapy   DVT prophylaxis: SCD Code Status: DNR Family Communication: daughter at bedside Disposition:  Status is: Inpatient Remains inpatient appropriate because:Hemodynamically unstable, Altered mental status, Ongoing diagnostic testing  needed not appropriate for outpatient work up and Unsafe d/c plan Dispo: The patient is from: Home              Anticipated d/c is to: SNF and 24 to 48 hours depending on stable mentation and electrolytes              Patient currently is not medically stable to d/c.   Difficult to place patient No     Subjective:  Overall weak and still confused  Objective: Vitals:   01/20/21 0400 01/20/21 0500 01/20/21 0824 01/20/21 1100  BP: 128/66  134/72 134/63  Pulse: 74   95  Resp: 13   16  Temp: 98.3 F (36.8 C)   98.6 F (37 C)  TempSrc: Oral   Axillary  SpO2: 96%   94%  Weight:  (!) 149.7 kg    Height:        Intake/Output Summary (Last 24 hours) at 01/20/2021 1413 Last data filed at 01/20/2021 1145 Gross per 24 hour  Intake 816.72 ml  Output 1525 ml  Net -708.28 ml   Filed Weights   01/18/21 0334 01/19/21 0405 01/20/21 0500  Weight: (!) 151.2 kg (!) 151.4 kg (!) 149.7 kg    Examination:  General: Appearance:    Severely obese female in no acute distress     Lungs:      respirations unlabored  Heart:    Normal heart rate. irr  MS:   All extremities are intact. Improved LE edema  Neurologic:   Awake, talks with her eyes closed    Radiology Studies: No results found.  Scheduled Meds: . bumetanide  2 mg Oral Daily  . diltiazem  360 mg Oral Daily  . glimepiride  1 mg Oral Q lunch  . insulin aspart  0-9 Units Subcutaneous Q4H  . lactulose  30 g Oral TID  . lidocaine  1 patch Transdermal Q24H  . metoprolol tartrate  50 mg Oral BID  . potassium chloride  20 mEq Oral Daily  . rifaximin  550 mg Oral BID  . spironolactone  12.5 mg Oral Daily   Continuous Infusions:   LOS: 10 days   Time spent: [place], DO [facility] To contact the attending provider between 7A-7P or the covering provider during after hours 7P-7A, please log into the web site [email or web address] and access using universal [place] password for that web site. If you do not have the  password, please call the hospital operator.  01/20/2021, 2:13 PM

## 2021-01-20 NOTE — Progress Notes (Signed)
This chaplain is present with the Pt. and the Pt. duaghter-Jessica.  The chaplain facilitated the arrival of the notary and witnesses for the notarizing of the Pt. Advance Directive:  HCPOA and Living Will.  The Pt. named Naomii Kreger as her healthcare agent.  If the healthcare agent is unable or unwilling to serve the Pt. has named Cheron Schaumann as her next choice.  The chaplain gave the original AD and two copies to the Pt.  A copy of the Pt. AD was scanned into the EMR.  This chaplain continued a bedside presence with the Pt. and Janett Billow as the Pt. shared her heart in prayer.  This chaplain is available for F/U spiritual care as needed.

## 2021-01-21 ENCOUNTER — Inpatient Hospital Stay (HOSPITAL_COMMUNITY): Payer: BC Managed Care – PPO

## 2021-01-21 LAB — CBC WITH DIFFERENTIAL/PLATELET
Abs Immature Granulocytes: 0.05 10*3/uL (ref 0.00–0.07)
Basophils Absolute: 0.1 10*3/uL (ref 0.0–0.1)
Basophils Relative: 1 %
Eosinophils Absolute: 0.2 10*3/uL (ref 0.0–0.5)
Eosinophils Relative: 2 %
HCT: 38.6 % (ref 36.0–46.0)
Hemoglobin: 12.8 g/dL (ref 12.0–15.0)
Immature Granulocytes: 1 %
Lymphocytes Relative: 16 %
Lymphs Abs: 1.1 10*3/uL (ref 0.7–4.0)
MCH: 28.7 pg (ref 26.0–34.0)
MCHC: 33.2 g/dL (ref 30.0–36.0)
MCV: 86.5 fL (ref 80.0–100.0)
Monocytes Absolute: 0.6 10*3/uL (ref 0.1–1.0)
Monocytes Relative: 9 %
Neutro Abs: 4.7 10*3/uL (ref 1.7–7.7)
Neutrophils Relative %: 71 %
RBC: 4.46 MIL/uL (ref 3.87–5.11)
RDW: 20.4 % — ABNORMAL HIGH (ref 11.5–15.5)
WBC: 6.6 10*3/uL (ref 4.0–10.5)
nRBC: 0 % (ref 0.0–0.2)

## 2021-01-21 LAB — GLUCOSE, CAPILLARY
Glucose-Capillary: 103 mg/dL — ABNORMAL HIGH (ref 70–99)
Glucose-Capillary: 115 mg/dL — ABNORMAL HIGH (ref 70–99)
Glucose-Capillary: 154 mg/dL — ABNORMAL HIGH (ref 70–99)
Glucose-Capillary: 153 mg/dL — ABNORMAL HIGH (ref 70–99)
Glucose-Capillary: 140 mg/dL — ABNORMAL HIGH (ref 70–99)
Glucose-Capillary: 147 mg/dL — ABNORMAL HIGH (ref 70–99)

## 2021-01-21 LAB — COMPREHENSIVE METABOLIC PANEL
ALT: 23 U/L (ref 0–44)
AST: 33 U/L (ref 15–41)
Albumin: 2.8 g/dL — ABNORMAL LOW (ref 3.5–5.0)
Alkaline Phosphatase: 75 U/L (ref 38–126)
Anion gap: 9 (ref 5–15)
BUN: 14 mg/dL (ref 8–23)
CO2: 28 mmol/L (ref 22–32)
Calcium: 8.9 mg/dL (ref 8.9–10.3)
Chloride: 100 mmol/L (ref 98–111)
Creatinine, Ser: 0.79 mg/dL (ref 0.44–1.00)
GFR, Estimated: >60 mL/min (ref >60)
Glucose, Bld: 127 mg/dL — ABNORMAL HIGH (ref 70–99)
Potassium: 2.9 mmol/L — ABNORMAL LOW (ref 3.5–5.1)
Sodium: 137 mmol/L (ref 135–145)
Total Bilirubin: 6 mg/dL — ABNORMAL HIGH (ref 0.3–1.2)
Total Protein: 6.5 g/dL (ref 6.5–8.1)

## 2021-01-21 LAB — BILIRUBIN, DIRECT: Bilirubin, Direct: 0.8 mg/dL — ABNORMAL HIGH (ref 0.0–0.2)

## 2021-01-21 MED ORDER — POTASSIUM CHLORIDE CRYS ER 20 MEQ PO TBCR
40.0000 meq | EXTENDED_RELEASE_TABLET | Freq: Once | ORAL | Status: AC
  Administered 2021-01-21: 40 meq via ORAL
  Filled 2021-01-21: qty 2

## 2021-01-21 MED ORDER — SPIRONOLACTONE 25 MG PO TABS
25.0000 mg | ORAL_TABLET | Freq: Every day | ORAL | Status: DC
  Administered 2021-01-22: 25 mg via ORAL
  Filled 2021-01-21: qty 1

## 2021-01-21 MED ORDER — POTASSIUM CHLORIDE 20 MEQ PO PACK
40.0000 meq | PACK | Freq: Every day | ORAL | Status: DC
  Administered 2021-01-22: 40 meq via ORAL
  Filled 2021-01-21: qty 2

## 2021-01-21 NOTE — Plan of Care (Signed)
  Problem: Clinical Measurements: Goal: Ability to maintain clinical measurements within normal limits will improve Outcome: Progressing Goal: Will remain free from infection Outcome: Progressing Goal: Respiratory complications will improve Outcome: Progressing Goal: Cardiovascular complication will be avoided Outcome: Progressing

## 2021-01-21 NOTE — Progress Notes (Signed)
PROGRESS NOTE   Hannah Khan  PFX:[id number] DOB: 1955/10/09 DOA: 01/10/2021 PCP: Lujean Amel, MD  Brief Narrative:    Recent shoulder surgery Dr. Berenice Primas in [facility] 12/30/2020-presented to [facility] ED acute altered mental status--had been discharged on oxycodone/Flexeril and using her CPAP as needed On arrival EMS found her A. fib RVR rates 130s hypertensive 160.  Slowly improving   Hospital-Problem based course  Toxic metabolic encephalopathy 2/2 hyperammonemia from cirrhosis Additionally recent prescription opiates, muscle relaxants from shoulder surgery Continue lactulose-- tritrate Added rifampicin 5/3 Delirium precautions -check head CT  A. fib RVR CHADS2 score >4 not a candidate for watchman or DCCV not anticoagulated 2/2 thrombocytopenia/ariceal bleed -metoprolol 50 twice daily, Cardizem 360 and titrate as needed Heart rate is better controlled  OSA on CPAP at night Continue positive pressure ventilation whenever sleeping Okay to be off oxygen during the day as long as sat above 88  HFpEF dryest weight is about 154 kg 171--> 151 kg currently Minus 10.8 follow-up L/since admit Resume Home Bumex = 2 mg twice daily   Cirrhosis with Hyperammonemia Lactulose as above -xifimin added  Diabetes mellitus type 2 Continue sliding scale coverage  Recent right shoulder arthroplasty [facility] Dr. Berenice Primas Postop management was supposed to be 01/11/2021 OT to evaluate and treat given recent right shoulder surgery  Hypokalemia  Resuming Aldactone Continue potassium supplementation  Prior Streptococcus mitis bacteremia Needs outpatient surveillance of the same  Moisture associated dermatitis from stool Turn frequently if able-ordered Gerhardtt's Mobilize with therapy   DVT prophylaxis: SCD Code Status: DNR Family Communication: daughter at bedside Disposition:  Status is: Inpatient Remains inpatient appropriate because:Hemodynamically unstable, Altered mental  status, Ongoing diagnostic testing needed not appropriate for outpatient work up and Unsafe d/c plan Dispo: The patient is from: Home              Anticipated d/c is to: SNF and 24 to 48 hours depending on stable mentation and electrolytes              Patient currently is not medically stable to d/c.   Difficult to place patient No     Subjective:  More awake-- says her vision is blurry  Objective: Vitals:   01/20/21 2149 01/20/21 2300 01/21/21 0300 01/21/21 0750  BP: 129/61 127/63 131/62 118/62  Pulse: (!) 108 95 94 100  Resp:  20 14 14   Temp:  98.2 F (36.8 C) 98.2 F (36.8 C) 98.4 F (36.9 C)  TempSrc:  Oral Oral Axillary  SpO2:  97% 99% 98%  Weight:   (!) 149.8 kg   Height:        Intake/Output Summary (Last 24 hours) at 01/21/2021 1048 Last data filed at 01/21/2021 0800 Gross per 24 hour  Intake 480 ml  Output 1025 ml  Net -545 ml   Filed Weights   01/19/21 0405 01/20/21 0500 01/21/21 0300  Weight: (!) 151.4 kg (!) 149.7 kg (!) 149.8 kg    Examination:  General: Appearance:    Severely obese female in no acute distress     Lungs:     respirations unlabored  Heart:    Tachycardic. irregular  MS:   All extremities are intact.   Neurologic:   More awake and alert        Radiology Studies: No results found.  Scheduled Meds: . bumetanide  2 mg Oral Daily  . diltiazem  360 mg Oral Daily  . glimepiride  1 mg Oral Q lunch  . insulin aspart  0-9 Units Subcutaneous Q4H  . lactulose  30 g Oral TID  . lidocaine  1 patch Transdermal Q24H  . metoprolol tartrate  50 mg Oral BID  . potassium chloride  20 mEq Oral Daily  . rifaximin  550 mg Oral BID  . spironolactone  12.5 mg Oral Daily   Continuous Infusions:   LOS: 11 days   Time spent: [place], DO [facility]   To contact the attending provider between 7A-7P or the covering provider during after hours 7P-7A, please log into the web site [email or web address] and access using universal [facility] password for that web site. If you do not have the password, please call the hospital operator.  01/21/2021, 10:48 AM

## 2021-01-21 NOTE — Progress Notes (Signed)
This chaplain is present for Pt. F/U spiritual care.  The Pt. Husband-Terry is at the bedside and willing to step outside the Pt. room at the request of the Pt.     The chaplain practices reflective listening as the Pt. speaks to the chaplain with clarity and complete thoughts during today's visit. The chaplain understands the Pt. finds hope in God's love on a daily basis. The Pt. often refers to her prayer life and prayers from others as a source of strength.  This chaplain will continue to provide F/U spiritual care for the Pt. and family.

## 2021-01-22 LAB — BASIC METABOLIC PANEL
Anion gap: 9 (ref 5–15)
BUN: 12 mg/dL (ref 8–23)
CO2: 26 mmol/L (ref 22–32)
Calcium: 9.2 mg/dL (ref 8.9–10.3)
Chloride: 102 mmol/L (ref 98–111)
Creatinine, Ser: 0.84 mg/dL (ref 0.44–1.00)
GFR, Estimated: >60 mL/min (ref >60)
Glucose, Bld: 121 mg/dL — ABNORMAL HIGH (ref 70–99)
Potassium: 3.6 mmol/L (ref 3.5–5.1)
Sodium: 137 mmol/L (ref 135–145)

## 2021-01-22 LAB — CBC
HCT: 41.4 % (ref 36.0–46.0)
Hemoglobin: 13.9 g/dL (ref 12.0–15.0)
MCH: 29.4 pg (ref 26.0–34.0)
MCHC: 33.6 g/dL (ref 30.0–36.0)
MCV: 87.5 fL (ref 80.0–100.0)
Platelets: UNDETERMINED 10*3/uL (ref 150–400)
RBC: 4.73 MIL/uL (ref 3.87–5.11)
RDW: 21 % — ABNORMAL HIGH (ref 11.5–15.5)
WBC: 7.2 10*3/uL (ref 4.0–10.5)
nRBC: 0 % (ref 0.0–0.2)

## 2021-01-22 LAB — GLUCOSE, CAPILLARY
Glucose-Capillary: 113 mg/dL — ABNORMAL HIGH (ref 70–99)
Glucose-Capillary: 112 mg/dL — ABNORMAL HIGH (ref 70–99)
Glucose-Capillary: 166 mg/dL — ABNORMAL HIGH (ref 70–99)

## 2021-01-22 LAB — RESP PANEL BY RT-PCR (FLU A&B, COVID) ARPGX2
Influenza A by PCR: NEGATIVE
Influenza B by PCR: NEGATIVE
SARS Coronavirus 2 by RT PCR: NEGATIVE

## 2021-01-22 MED ORDER — INSULIN ASPART 100 UNIT/ML IJ SOLN
0.0000 [IU] | Freq: Three times a day (TID) | INTRAMUSCULAR | Status: DC
  Administered 2021-01-22: 2 [IU] via SUBCUTANEOUS

## 2021-01-22 MED ORDER — INSULIN ASPART 100 UNIT/ML IJ SOLN: 0.0000 [IU] | Freq: Every day | INTRAMUSCULAR | Status: DC

## 2021-01-22 MED ORDER — GERHARDT'S BUTT CREAM
1.0000 | TOPICAL_CREAM | CUTANEOUS | Status: DC | PRN
Start: 2021-01-22 — End: 2021-02-17

## 2021-01-22 MED ORDER — ALUM & MAG HYDROXIDE-SIMETH 200-200-20 MG/5ML PO SUSP: 30.0000 mL | Freq: Four times a day (QID) | ORAL | 0 refills | Status: DC | PRN

## 2021-01-22 MED ORDER — MELATONIN 5 MG PO TABS: 5.0000 mg | ORAL_TABLET | Freq: Every day | ORAL | Status: DC

## 2021-01-22 MED ORDER — ALUM & MAG HYDROXIDE-SIMETH 200-200-20 MG/5ML PO SUSP: 30.0000 mL | Freq: Four times a day (QID) | ORAL | Status: DC | PRN

## 2021-01-22 MED ORDER — GLIMEPIRIDE 1 MG PO TABS: 1.0000 mg | ORAL_TABLET | Freq: Every day | ORAL | Status: DC

## 2021-01-22 MED ORDER — LACTULOSE 10 GM/15ML PO SOLN: 20.0000 g | Freq: Three times a day (TID) | ORAL | 0 refills | Status: DC

## 2021-01-22 MED ORDER — LIDOCAINE 5 % EX PTCH: 1.0000 | MEDICATED_PATCH | CUTANEOUS | 0 refills | Status: DC

## 2021-01-22 MED ORDER — MELATONIN 5 MG PO TABS: 5.0000 mg | ORAL_TABLET | Freq: Every day | ORAL | 0 refills | Status: DC

## 2021-01-22 MED ORDER — DILTIAZEM HCL ER COATED BEADS 360 MG PO CP24: 360.0000 mg | ORAL_CAPSULE | Freq: Every day | ORAL | Status: DC

## 2021-01-22 MED ORDER — RIFAXIMIN 550 MG PO TABS: 550.0000 mg | ORAL_TABLET | Freq: Two times a day (BID) | ORAL | Status: DC

## 2021-01-22 NOTE — TOC Progression Note (Signed)
Transition of Care [facility]) - Progression Note    Patient Details  Name: Hannah Khan MRN: [id number] Date of Birth: May 02, 1956  [facility] of [facility]) CM/SW Contact  Reece Agar, [place] Phone Number: 01/22/2021, 11:19 AM  Clinical Narrative:    9:45am- Juliann Pulse with [facility] contacted CSW and confirmed insurance auth they are ready for pt, Dr. Eliseo Squires put in rapid COVID, pt is waiting on COVID results for DC. CSW placed facesheet/PTAR form in packet along with signed DNR form.    Expected Discharge Plan: [place] Barriers to Discharge: Continued Medical Work up  Expected Discharge Plan and Services Expected Discharge Plan: [place] In-house Referral: Clinical Social Work   Post Acute Care Choice: [place] Living arrangements for the past 2 months: Single Family Home                                       Social Determinants of Health (SDOH) Interventions    Readmission Risk Interventions No flowsheet data found.

## 2021-01-22 NOTE — Progress Notes (Signed)
   Palliative Medicine Inpatient Follow Up Note  Have requested that [place] provide bereavement resources to patients husband, Coralyn Mark.   Sent a secure chat to [place], case manager and Webb Silversmith, liaison for [facility].   No Charge ______________________________________________________________________________________ [place] Team Team Cell Phone: [phone] Please utilize secure chat with additional questions, if there is no response within 30 minutes please call the above phone number  Palliative Medicine Team providers are available by phone from 7am to 7pm daily and can be reached through the team cell phone.  Should this patient require assistance outside of these hours, please call the patient's attending physician.

## 2021-01-22 NOTE — TOC Transition Note (Signed)
Transition of Care [facility]) - CM/SW Discharge Note   Patient Details  Name: Hannah Khan MRN: [id number] Date of Birth: [birth year]  Transition of Care [facility]) CM/SW Contact:  Tresa Endo Phone Number: 01/22/2021, 11:35 AM   Clinical Narrative:    Patient will DC to: [place] Anticipated DC date: 01/22/2021 Family notified: Husband Transport by: Corey Harold   Per MD patient ready for DC to [place] room 121. RN to call report prior to discharge [phone]). RN, patient, patient's family, and facility notified of DC. Discharge Summary and FL2 sent to facility. DC packet on chart. Ambulance transport requested for patient.   CSW will sign off for now as social work intervention is no longer needed. Please consult [place] again if new needs arise.      Final next level of care: Skilled Nursing Facility Barriers to Discharge: Continued Medical Work up   Patient Goals and CMS Choice Patient states their goals for this hospitalization and ongoing recovery are:: Rehab and Recovery CMS Medicare.gov Compare Post Acute Care list provided to:: Patient Choice offered to / list presented to : Patient  Discharge Placement                       Discharge Plan and Services In-house Referral: Clinical Social Work   Post Acute Care Choice: [place]                               Social Determinants of Health (SDOH) Interventions     Readmission Risk Interventions No flowsheet data found.

## 2021-01-22 NOTE — Progress Notes (Signed)
Physical Therapy Treatment Patient Details Name: Hannah Khan MRN: [id number] DOB: April 30, 1956 Today's Date: 01/22/2021    History of Present Illness Pt is a 65 y.o. female with recent R shoulder sx (12/30/20), now admitted 01/10/21 with AMS, somnolence. Pt with acute hypoxic respiratory failure, acute metabolic encephalopathy; potential polypharmacy. Head CT negative for acute findings. Liver failure. PMH includes afib, DM2, OSA, cirrhosis, obesity.    PT Comments    Pt received in supine, spouse present, pt pleasantly agreeable to therapy session and with good tolerance for bed mobility and seated scooting training as well as seated balance tasks. Pt needing up to +2 max/totalA for bed mobility (increased assist at end of session due to fatigue) and unable to reach upright stance with transfer training due to fatigue/UE weakness (notably poor grip strength observed when attempting to support herself with bed rails after extended time seated). She tolerated sitting EOB >20 minutes. VSS on RA throughout. Pt continues to benefit from PT services to progress toward functional mobility goals. Continue to recommend SNF.  Follow Up Recommendations  SNF;Supervision/Assistance - 24 hour     Equipment Recommendations  Hospital bed    Recommendations for Other Services       Precautions / Restrictions Precautions Precautions: Fall Restrictions RUE Weight Bearing: Partial weight bearing Other Position/Activity Restrictions: Per patient report - can be partial weight bearing, but not full weight bearing yet; no ROM restrictions that she is aware, has a paper at home with exercises from [place] Berenice Primas [place]    Mobility  Bed Mobility Overal bed mobility: Needs Assistance Bed Mobility: Rolling;Sidelying to Sit;Sit to Sidelying Rolling: +2 for physical assistance;Mod assist;+2 for safety/equipment Sidelying to sit: Mod assist;HOB elevated;+2 for physical assistance     Sit to sidelying: Total  assist;+2 for physical assistance General bed mobility comments: log roll to R EOB, multimodal cues for LUE on bed rail to assist and technique, good initiation for BLE movement with vcs/tcs but needs most assist with trunk to rise; increased assist to return to supine after activity 2/2 fatigue    Transfers Overall transfer level: Needs assistance   Transfers: Sit to/from Stand;Lateral/Scoot Transfers Sit to Stand: +2 physical assistance;Max assist;Total assist;+2 safety/equipment;From elevated surface        Lateral/Scoot Transfers: Max assist;+2 physical assistance;+2 safety/equipment;From elevated surface General transfer comment: attempted to stand from elevated bed height to [facility] but difficulty 2/2 fatigue vs cognition and body habitus, able to lift buttocks 1-2" from EOB but unable to stand fully upright x3 attempts; seated scooting along EOB 26f with transfer pad assist with max cues and bed slightly in trendelenburg for ease of scooting  Ambulation/Gait             General Gait Details: Unable to attempt, pt unable to stand fully upright this date   Stairs             Wheelchair Mobility    Modified Rankin (Stroke Patients Only)       Balance Overall balance assessment: Needs assistance Sitting-balance support: No upper extremity supported;Feet supported Sitting balance-Leahy Scale: Poor Sitting balance - Comments: EOB varying min guard to modA with cues for neutral cx posture and not to lean too far to L side as she fatigued needed more assist; EOB grossly 20 mins. Seated reaching/balance activity at EOB with variable assist needed.       Standing balance comment: unable this date  Cognition Arousal/Alertness: Awake/alert Behavior During Therapy: WFL for tasks assessed/performed Overall Cognitive Status: Impaired/Different from baseline Area of Impairment: Attention;Memory;Following  commands;Safety/judgement;Awareness;Problem solving                   Current Attention Level: Sustained Memory: Decreased short-term memory;Decreased recall of precautions Following Commands: Follows one step commands consistently;Follows one step commands with increased time Safety/Judgement: Decreased awareness of deficits;Decreased awareness of safety Awareness: Intellectual Problem Solving: Slow processing;Decreased initiation;Requires verbal cues;Requires tactile cues;Difficulty sequencing General Comments: decreased insight into abilities (when fatigues, seems unaware) but motivated to progress strength/endurance and oriented to self/situation/spouse (not further assessed); needs multimodal cues for all functional mobility tasks and slow to process      Exercises General Exercises - Lower Extremity Ankle Circles/Pumps: AROM;Both;10 reps;Seated Long Arc Quad: AROM;Both;10 reps;Seated (rest breaks between reps of 3-4)    General Comments General comments (skin integrity, edema, etc.): BP 131/80 (94) seated EOB and BP 119/68 (84) after return to supine; HR 101-115 bpm with exertion and RR 16-22 rpm; SpO2 97-99% on RA throughout; per spouse she is R hand dominant      Pertinent Vitals/Pain Pain Assessment: No/denies pain Faces Pain Scale: No hurt Pain Location: back feels "itchy" but no pain Pain Intervention(s): Monitored during session;Repositioned    Home Living                      Prior Function            PT Goals (current goals can now be found in the care plan section) Acute Rehab PT Goals Patient Stated Goal: pt and husband agree ST rehab is optimal PT Goal Formulation: With patient/family Time For Goal Achievement: 02/02/21 Potential to Achieve Goals: Good Progress towards PT goals: Progressing toward goals    Frequency    Min 2X/week      PT Plan Current plan remains appropriate    Co-evaluation              AM-PAC PT "6  Clicks" Mobility   Outcome Measure  Help needed turning from your back to your side while in a flat bed without using bedrails?: A Lot Help needed moving from lying on your back to sitting on the side of a flat bed without using bedrails?: A Lot Help needed moving to and from a bed to a chair (including a wheelchair)?: Total Help needed standing up from a chair using your arms (e.g., wheelchair or bedside chair)?: Total Help needed to walk in hospital room?: Total Help needed climbing 3-5 steps with a railing? : Total 6 Click Score: 8    End of Session Equipment Utilized During Treatment: Gait belt Activity Tolerance: Patient limited by fatigue (motivated but too fatigued to stand upright) Patient left: in bed;with call bell/phone within reach;with family/visitor present;with bed alarm set (spouse present, bed in chair position and pillows folded to side of each hip for improved UE support) Nurse Communication: Mobility status PT Visit Diagnosis: Other abnormalities of gait and mobility (R26.89);Difficulty in walking, not elsewhere classified (R26.2);Muscle weakness (generalized) (M62.81) Pain - Right/Left: Right Pain - part of body: Shoulder     Time: 4782-9562 PT Time Calculation (min) (ACUTE ONLY): 42 min  Charges:  $Therapeutic Activity: 8-22 mins                     Jahiem Franzoni P., PTA Acute Rehabilitation Services Pager: [phone] Office: [place] 01/22/2021, 1:27 PM

## 2021-01-22 NOTE — TOC Progression Note (Signed)
Transition of Care [facility]) - Progression Note    Patient Details  Name: Hannah Khan MRN: [id number] Date of Birth: Apr 03, 1956  Transition of Care [facility]) CM/SW [place], RN Phone Number: 01/22/2021, 11:17 AM  Clinical Narrative:    Case management spoke with Tacey Ruiz, NP today and palliative care spoke with the patient's husband and the family is requesting bereavement resources through [place].  I called and spoke with Webb Silversmith, [facility] with [place] [phone]) and they will follow up with the patient's husband to give resources available to the family.   Expected Discharge Plan: [place] Barriers to Discharge: Continued Medical Work up  Expected Discharge Plan and Services Expected Discharge Plan: [place] In-house Referral: Clinical Social Work   Post Acute Care Choice: [place] Living arrangements for the past 2 months: Single Family Home                                       Social Determinants of Health (SDOH) Interventions    Readmission Risk Interventions No flowsheet data found.

## 2021-01-22 NOTE — Plan of Care (Signed)

## 2021-01-22 NOTE — Discharge Summary (Signed)
Physician Discharge Summary  Hannah Khan VVO:[id number] DOB: 1955/10/30 DOA: 01/10/2021  PCP: Lujean Amel, MD  Admit date: 01/10/2021 Discharge date: 01/22/2021  Admitted From: home Discharge disposition: SNF   Recommendations for Outpatient Follow-Up:   1. Daily weights 2. Closely monitor blood sugars-- adjust meds as able 3. Please titrate lactulose for 2-3 large BMS per day (scheduled for 3 but may not need this many) 4. CMP 1 week- aldactone may need to be adjusted  5. Palliative care to follow at SNF 6. CPAP QHS and while napping   Discharge Diagnosis:   Principal Problem:   Atrial fibrillation with RVR (HCC) Active Problems:   Obstructive sleep apnea   Acute on chronic diastolic CHF (congestive heart failure) (HCC)   Thrombocytopenia (HCC)   Acute respiratory failure with hypoxia (HCC)   Type 2 diabetes mellitus with other specified complication (HCC)   Acute lower UTI   Liver cirrhosis secondary to NASH (nonalcoholic steatohepatitis) ([place])   Acute encephalopathy    Discharge Condition: Improved.  Diet recommendation: Low sodium, heart healthy.  Carbohydrate-modified  Wound care: Maalox liquid applied with a cotton ball. Once it has dried, a protective barrier such as Aquaphor or zinc oxide cream can be smoothed on to her irritated skin  Code status: DNR   History of Present Illness:   Hannah Khan is a 65 y.o. female with medical history significant for cirrhosis with varices, chronic thrombocytopenia, atrial fibrillation not anticoagulated due to variceal bleed and low platelets, cirrhosis, type 2 diabetes mellitus, OSA, BMI 55, and right shoulder surgery on 12/30/2020, now presenting to the emergency department for evaluation of altered mental status.  Patient's husband is at the bedside assisting with the history.  Patient has been sleeping in a recliner the past several days until the night of 01/08/2021 when she slept in bed, continued to sleep  all day, and was then noted to be slipping out of the bed last night, having difficulty waking up, and confused.  She has been taking oxycodone every 4-6 hours and Flexeril every 8 hours since the recent surgery, has been using her CPAP as directed, and had been taking her medications until yesterday when she was sleeping and did not take them.  Husband also notes that she has been incontinent of urine and with her appeared to be some specks of blood.  She had several episodes of vomiting a few nights ago but that resolved and she was not complaining of any abdominal pain or fever.  She continued to be somnolent and confused last night, EMS was called and found the patient to be in atrial fibrillation with heart rate 1 80-1 90.  She was given 450 mL of IV fluids and 30 mg Cardizem by EMS prior to arrival.   Hospital Course by Problem:   Toxic metabolic encephalopathy 2/2 hyperammonemia from cirrhosis Additionally recent prescription opiates, muscle relaxants from shoulder surgery Continue lactulose-- tritrate to BMs Added rifampicin 5/3- appears to have had good results Delirium precautions  A. fib RVR CHADS2 score >4 not a candidate for watchman or DCCV  -not anticoagulated 2/2 thrombocytopenia/GI bleed -metoprolol 50 twice daily, Cardizem 360 and titrate as needed Heart rate is better controlled -CT Scan shows old CVAs  OSA on CPAP at night Continue positive pressure ventilation whenever sleeping Okay to be off oxygen during the day as long as sat above 88  HFpEF Current weight 326 Daily weights  Cirrhosis with Hyperammonemia Lactulose as above -xifimin  added  Diabetes mellitus type 2 -started on amaryl with good results -monitor blood sugars closely  Recent right shoulder arthroplasty [facility] Dr. Berenice Primas Postop management was supposed to be 01/11/2021 -outpatient follow up  Hypokalemia  Resuming Aldactone Continue potassium supplementation -recheck K within 1  week  Prior Streptococcus mitis bacteremia Needs outpatient surveillance of the same  Moisture associated dermatitis from stool See wound care above Mobilize with therapy    Medical Consultants:   PCCM   Discharge Exam:   Vitals:   01/22/21 0724 01/22/21 1124  BP: (!) 148/67 135/72  Pulse: (!) 102 (!) 112  Resp: 16 19  Temp: 98.6 F (37 C) 98 F (36.7 C)  SpO2: 98% 97%   Vitals:   01/21/21 2314 01/22/21 0408 01/22/21 0724 01/22/21 1124  BP: 110/66 (!) 132/59 (!) 148/67 135/72  Pulse: 95 86 (!) 102 (!) 112  Resp: 15 19 16 19   Temp: 98.3 F (36.8 C) 97.6 F (36.4 C) 98.6 F (37 C) 98 F (36.7 C)  TempSrc: Oral Oral Oral Oral  SpO2: 98% 99% 98% 97%  Weight:  (!) 148.3 kg    Height:        General exam: Appears calm and comfortable. More awake and able to participate with PT   The results of significant diagnostics from this hospitalization (including imaging, microbiology, ancillary and laboratory) are listed below for reference.     Procedures and Diagnostic Studies:   CT Head Wo Contrast  Result Date: 01/10/2021 CLINICAL DATA:  Acute neurological deficit. EXAM: CT HEAD WITHOUT CONTRAST TECHNIQUE: Contiguous axial images were obtained from the base of the skull through the vertex without intravenous contrast. COMPARISON:  03/20/2018 FINDINGS: Brain: No evidence of acute infarction, hemorrhage, hydrocephalus, extra-axial collection or mass lesion/mass effect. Vascular: Moderate intracranial arterial calcifications. Skull: Calvarium appears intact. Sinuses/Orbits: Paranasal sinuses and mastoid air cells are clear. Other: None. IMPRESSION: No acute intracranial abnormalities. Electronically Signed   By: Lucienne Capers M.D.   On: 01/10/2021 02:19   CT Angio Chest PE W and/or Wo Contrast  Result Date: 01/10/2021 CLINICAL DATA:  Chest pain and shortness of breath. Altered mental status. Atrial fibrillation. EXAM: CT ANGIOGRAPHY CHEST WITH CONTRAST TECHNIQUE:  Multidetector CT imaging of the chest was performed using the standard protocol during bolus administration of intravenous contrast. Multiplanar CT image reconstructions and MIPs were obtained to evaluate the vascular anatomy. CONTRAST:  170m OMNIPAQUE IOHEXOL 350 MG/ML SOLN COMPARISON:  03/15/2020 FINDINGS: Cardiovascular: Examination is technically limited due to body habitus and poor contrast bolus. Due to technical factors, the examination is nondiagnostic for evaluation of pulmonary embolus. Diffuse cardiac enlargement. Normal caliber thoracic aorta. Great vessel origins are patent. Aberrant right subclavian artery. Mediastinum/Nodes: Esophagus is decompressed. No significant lymphadenopathy in the chest. Lungs/Pleura: Small bilateral pleural effusions with basilar atelectasis. Patchy perihilar infiltrates likely due to edema. Upper Abdomen: Upper abdominal contents suggest cirrhotic configuration of the liver with splenic enlargement. Prominent varices are suggested. Musculoskeletal: Degenerative changes in the spine. Review of the MIP images confirms the above findings. IMPRESSION: 1. Technically limited study due to body habitus and poor contrast bolus. Due to technical factors, the examination is nondiagnostic for evaluation of pulmonary embolus. 2. Diffuse cardiac enlargement with small bilateral pleural effusions and perihilar infiltrates likely due to edema. 3. Aberrant right subclavian artery. 4. Probable cirrhotic configuration of the liver with splenic enlargement and varices. 5. Aortic atherosclerosis. Aortic Atherosclerosis (ICD10-I70.0). Electronically Signed   By: WLucienne CapersM.D.   On:  01/10/2021 02:24   DG Chest Portable 1 View  Result Date: 01/10/2021 CLINICAL DATA:  Atrial fibrillation. EXAM: PORTABLE CHEST 1 VIEW COMPARISON:  03/16/2020 FINDINGS: Shallow inspiration. Cardiac enlargement with vascular congestion and perihilar infiltration, likely due to edema. Small bilateral pleural  effusions. No pneumothorax. IMPRESSION: No active disease. Electronically Signed   By: Lucienne Capers M.D.   On: 01/10/2021 00:56     Labs:   Basic Metabolic Panel: Recent Labs  Lab 01/17/21 0024 01/18/21 0050 01/19/21 0113 01/20/21 0044 01/21/21 0111 01/22/21 0059  NA 137 138 136 137 137 137  K 3.3* 3.4* 2.7* 3.2* 2.9* 3.6  CL 97* 95* 96* 102 100 102  CO2 30 34* 29 29 28 26   GLUCOSE 134* 139* 133* 120* 127* 121*  BUN 14 14 12 13 14 12   CREATININE 0.82 0.92 0.83 0.81 0.79 0.84  CALCIUM 8.6* 8.7* 8.7* 8.8* 8.9 9.2  MG 1.9  --  2.0 2.0  --   --    GFR Estimated Creatinine Clearance: 102.9 mL/min (by C-G formula based on SCr of 0.84 mg/dL). Liver Function Tests: Recent Labs  Lab 01/17/21 0024 01/18/21 0050 01/19/21 0113 01/20/21 0044 01/21/21 0111  AST 27 47* 30 33 33  ALT 18 22 19 22 23   ALKPHOS 65 61 64 72 75  BILITOT 5.7* 5.2* 5.8* 7.3* 6.0*  PROT 6.6 6.4* 6.4* 6.5 6.5  ALBUMIN 3.0* 3.0* 2.8* 2.8* 2.8*   No results for input(s): LIPASE, AMYLASE in the last 168 hours. No results for input(s): AMMONIA in the last 168 hours. Coagulation profile No results for input(s): INR, PROTIME in the last 168 hours.  CBC: Recent Labs  Lab 01/17/21 0024 01/18/21 0050 01/19/21 0113 01/20/21 0044 01/21/21 0111 01/22/21 0059  WBC 4.2 4.9 5.1 6.6 6.6 7.2  NEUTROABS 2.8 3.4 3.4 4.5 4.7  --   HGB 12.3 12.0 12.0 12.9 12.8 13.9  HCT 38.3 37.2 36.5 38.7 38.6 41.4  MCV 88.5 87.9 87.3 87.0 86.5 87.5  PLT PLATELET CLUMPING, SUGGEST RECOLLECTION OF SAMPLE IN CITRATE TUBE. PLATELET CLUMPS NOTED ON SMEAR, UNABLE TO ESTIMATE 30* PLATELET CLUMPS NOTED ON SMEAR, UNABLE TO ESTIMATE PLATELET CLUMPING, SUGGEST RECOLLECTION OF SAMPLE IN CITRATE TUBE. PLATELET CLUMPS NOTED ON SMEAR, UNABLE TO ESTIMATE   Cardiac Enzymes: No results for input(s): CKTOTAL, CKMB, CKMBINDEX, TROPONINI in the last 168 hours. BNP: Invalid input(s): POCBNP CBG: Recent Labs  Lab 01/21/21 1924 01/21/21 2305  01/22/21 0404 01/22/21 0817 01/22/21 1126  GLUCAP 140* 147* 113* 112* 166*   D-Dimer No results for input(s): DDIMER in the last 72 hours. Hgb A1c No results for input(s): HGBA1C in the last 72 hours. Lipid Profile No results for input(s): CHOL, HDL, LDLCALC, TRIG, CHOLHDL, LDLDIRECT in the last 72 hours. Thyroid function studies No results for input(s): TSH, T4TOTAL, T3FREE, THYROIDAB in the last 72 hours.  Invalid input(s): FREET3 Anemia work up No results for input(s): VITAMINB12, FOLATE, FERRITIN, TIBC, IRON, RETICCTPCT in the last 72 hours. Microbiology No results found for this or any previous visit (from the past 240 hour(s)).   Discharge Instructions:   Discharge Instructions    (HEART FAILURE PATIENTS) Call MD:  Anytime you have any of the following symptoms: 1) 3 pound weight gain in 24 hours or 5 pounds in 1 week 2) shortness of breath, with or without a dry hacking cough 3) swelling in the hands, feet or stomach 4) if you have to sleep on extra pillows at night in order to breathe.  Complete by: As directed    Diet - low sodium heart healthy   Complete by: As directed    Diet Carb Modified   Complete by: As directed    Heart Failure patients record your daily weight using the same scale at the same time of day   Complete by: As directed    Increase activity slowly   Complete by: As directed      Allergies as of 01/22/2021      Reactions   Celecoxib Other (See Comments)   "speech slurred" with celebrex   Shellfish Allergy Swelling, Rash   TINGLING AND SWELLING OF LIPS. LARGE WHELPS QUARTER-SIZE   Barium-containing Compounds Other (See Comments)   TACHYCARDIA   Prednisone Other (See Comments)   TACHYCARDIA   Statins Other (See Comments)   AGGRAVATED FIBROMYALGIA   Fentanyl    Hallucinations    Ibuprofen Other (See Comments)   UNSPECIFIED SPECIFIC REACTION >> "DUE TO PLATELETS"   Tramadol    Hallucination   Tylenol [acetaminophen] Other (See Comments)    UNSPECIFIED SPECIFIC REACTION >> "DUE TO PLATELETS"      Medication List    STOP taking these medications   cyclobenzaprine 10 MG tablet Commonly known as: FLEXERIL   Jardiance 25 MG Tabs tablet Generic drug: empagliflozin   metolazone 5 MG tablet Commonly known as: ZAROXOLYN   mirabegron ER 50 MG Tb24 tablet Commonly known as: MYRBETRIQ   oxyCODONE 5 MG immediate release tablet Commonly known as: Oxy IR/ROXICODONE     TAKE these medications   albuterol 108 (90 Base) MCG/ACT inhaler Commonly known as: VENTOLIN HFA Inhale 2 puffs into the lungs every 6 (six) hours as needed for wheezing or shortness of breath.   alum & mag hydroxide-simeth 200-200-20 MG/5ML suspension Commonly known as: MAALOX/MYLANTA Apply 30 mLs topically every 6 (six) hours as needed (skin).   bumetanide 2 MG tablet Commonly known as: BUMEX TAKE 1 TABLET (2 MG TOTAL) BY MOUTH 2 (TWO) TIMES DAILY. What changed: See the new instructions.   diltiazem 360 MG 24 hr capsule Commonly known as: CARDIZEM CD Take 1 capsule (360 mg total) by mouth daily. Start taking on: Jan 23, 2021 What changed:   medication strength  how much to take   Gerhardt's butt cream Crea Apply 1 application topically as needed for irritation.   glimepiride 1 MG tablet Commonly known as: AMARYL Take 1 tablet (1 mg total) by mouth daily with lunch. Start taking on: Jan 23, 2021   lactulose 10 GM/15ML solution Commonly known as: CHRONULAC Take 30 mLs (20 g total) by mouth 3 (three) times daily.   lidocaine 5 % Commonly known as: LIDODERM Place 1 patch onto the skin daily. Remove & Discard patch within 12 hours or as directed by MD Start taking on: Jan 23, 2021   melatonin 5 MG Tabs Take 1 tablet (5 mg total) by mouth at bedtime.   metoprolol tartrate 50 MG tablet Commonly known as: LOPRESSOR Take 1 tablet (50 mg total) by mouth 2 (two) times daily.   ondansetron 4 MG tablet Commonly known as: ZOFRAN Take 4 mg by  mouth every 8 (eight) hours as needed for vomiting or nausea.   OneTouch Delica Plus [id number] Misc Apply topically 3 (three) times daily.   OneTouch Verio test strip Generic drug: glucose blood 1 each 3 (three) times daily.   potassium chloride 10 MEQ CR capsule Commonly known as: Klor-Con Sprinkle TAKE 4 DAILY What changed:   how much to  take  how to take this  when to take this  additional instructions   rifaximin 550 MG Tabs tablet Commonly known as: XIFAXAN Take 1 tablet (550 mg total) by mouth 2 (two) times daily.   spironolactone 25 MG tablet Commonly known as: ALDACTONE Take 1 tablet (25 mg total) by mouth daily. Schedule ov for further refills.       Contact information for follow-up providers    [facility] Follow up.   Specialty: PALLIATIVE CARE Why: [facility] will follow up with the family in regards to bereavement resources in the [facility] area. Contact information: [place] [address] [phone]       Koirala, Dibas, MD Follow up in 1 week(s).   Specialty: Family Medicine Contact information: [place] [address] [phone]        Skeet Latch, MD .   Specialty: Cardiology Contact information: [address] [place] [address] [phone]            Contact information for after-discharge care    [facility] .   Service: Skilled Nursing Contact information: [address] [place] [phone]                   Time coordinating discharge: 35 min  Signed:  Geradine Girt DO  [facility] 01/22/2021, 1:25 PM

## 2021-01-22 NOTE — Discharge Instructions (Signed)
Atrial Fibrillation  Atrial fibrillation is a type of heartbeat that is irregular or fast. If you have this condition, your heart beats without any order. This makes it hard for your heart to pump blood in a normal way. Atrial fibrillation may come and go, or it may become a long-lasting problem. If this condition is not treated, it can put you at higher risk for stroke, heart failure, and other heart problems. What are the causes? This condition may be caused by diseases that damage the heart. They include:  High blood pressure.  Heart failure.  Heart valve disease.  Heart surgery. Other causes include:  Diabetes.  Thyroid disease.  Being overweight.  Kidney disease. Sometimes the cause is not known. What increases the risk? You are more likely to develop this condition if:  You are older.  You smoke.  You exercise often and very hard.  You have a family history of this condition.  You are a man.  You use drugs.  You drink a lot of alcohol.  You have lung conditions, such as emphysema, pneumonia, or COPD.  You have sleep apnea. What are the signs or symptoms? Common symptoms of this condition include:  A feeling that your heart is beating very fast.  Chest pain or discomfort.  Feeling short of breath.  Suddenly feeling light-headed or weak.  Getting tired easily during activity.  Fainting.  Sweating. In some cases, there are no symptoms. How is this treated? Treatment for this condition depends on underlying conditions and how you feel when you have atrial fibrillation. They include:  Medicines to: ? Prevent blood clots. ? Treat heart rate or heart rhythm problems.  Using devices, such as a pacemaker, to correct heart rhythm problems.  Doing surgery to remove the part of the heart that sends bad signals.  Closing an area where clots can form in the heart (left atrial appendage). In some cases, your doctor will treat other underlying  conditions. Follow these instructions at home: Medicines  Take over-the-counter and prescription medicines only as told by your doctor.  Do not take any new medicines without first talking to your doctor.  If you are taking blood thinners: ? Talk with your doctor before you take any medicines that have aspirin or NSAIDs, such as ibuprofen, in them. ? Take your medicine exactly as told by your doctor. Take it at the same time each day. ? Avoid activities that could hurt or bruise you. Follow instructions about how to prevent falls. ? Wear a bracelet that says you are taking blood thinners. Or, carry a card that lists what medicines you take. Lifestyle  Do not use any products that have nicotine or tobacco in them. These include cigarettes, e-cigarettes, and chewing tobacco. If you need help quitting, ask your doctor.  Eat heart-healthy foods. Talk with your doctor about the right eating plan for you.  Exercise regularly as told by your doctor.  Do not drink alcohol.  Lose weight if you are overweight.  Do not use drugs, including cannabis.      General instructions  If you have a condition that causes breathing to stop for a short period of time (apnea), treat it as told by your doctor.  Keep a healthy weight. Do not use diet pills unless your doctor says they are safe for you. Diet pills may make heart problems worse.  Keep all follow-up visits as told by your doctor. This is important. Contact a doctor if:  You notice a  change in the speed, rhythm, or strength of your heartbeat.  You are taking a blood-thinning medicine and you get more bruising.  You get tired more easily when you move or exercise.  You have a sudden change in weight. Get help right away if:  You have pain in your chest or your belly (abdomen).  You have trouble breathing.  You have side effects of blood thinners, such as blood in your vomit, poop (stool), or pee (urine), or bleeding that cannot  stop.  You have any signs of a stroke. "BE FAST" is an easy way to remember the main warning signs: ? B - Balance. Signs are dizziness, sudden trouble walking, or loss of balance. ? E - Eyes. Signs are trouble seeing or a change in how you see. ? F - Face. Signs are sudden weakness or loss of feeling in the face, or the face or eyelid drooping on one side. ? A - Arms. Signs are weakness or loss of feeling in an arm. This happens suddenly and usually on one side of the body. ? S - Speech. Signs are sudden trouble speaking, slurred speech, or trouble understanding what people say. ? T - Time. Time to call emergency services. Write down what time symptoms started.  You have other signs of a stroke, such as: ? A sudden, very bad headache with no known cause. ? Feeling like you may vomit (nausea). ? Vomiting. ? A seizure. These symptoms may be an emergency. Do not wait to see if the symptoms will go away. Get medical help right away. Call your local emergency services (911 in the [place] Do not drive yourself to the hospital.   Summary  Atrial fibrillation is a type of heartbeat that is irregular or fast.  You are at higher risk of this condition if you smoke, are older, have diabetes, or are overweight.  Follow your doctor's instructions about medicines, diet, exercise, and follow-up visits.  Get help right away if you have signs or symptoms of a stroke.  Get help right away if you cannot catch your breath, or you have chest pain or discomfort. This information is not intended to replace advice given to you by your health care provider. Make sure you discuss any questions you have with your health care provider. Document Revised: 02/27/2019 Document Reviewed: 02/27/2019 Elsevier Patient Education  2021 [place].   Delirium Delirium is a state of mental confusion. It comes on quickly and causes significant changes in a person's thinking and behavior. People with delirium usually have  trouble paying attention to what is going on or knowing where they are. They may become very withdrawn or very emotional and unable to sit still. They may even see or feel things that are not there (hallucinations). Delirium is a sign of a serious underlying medical condition. What are the causes? Delirium occurs when something suddenly affects the signals that the brain sends out. Brain signals can be affected by anything that puts severe stress on the body and brain and causes brain chemicals to be out of balance. The most common causes of delirium include:  Infections. These may be bacterial, viral, fungal, or protozoal.  Medicines. These include many over-the-counter and prescription medicines.  Recreational drugs.  Substance withdrawal. This occurs with sudden discontinuation of alcohol, certain medicines, or recreational drugs.  Surgery and anesthesia.  Sudden vascular events, such as stroke and brain hemorrhage.  Other brain disorders, such as migraines, tumors, seizures, and physical head trauma.  Metabolic disorders, such as kidney or liver failure.  Low blood oxygen (anoxia). This may occur with lung disease, cardiac arrest, or carbon monoxide poisoning.  Hormone imbalances (endocrinopathies), such as an overactive thyroid (hyperthyroidism) or underactive thyroid (hypothyroidism).  Vitamin deficiencies. What increases the risk? The following factors may make someone more likely to develop this condition:  Being a child.  Being an older person.  Living alone.  Having vision loss or hearing loss.  Having an existing brain disease, such as dementia.  Having long-lasting (chronic) medical conditions, such as heart disease.  Being hospitalized for long periods of time. What are the signs or symptoms? Delirium starts with a sudden change in a person's thinking or behavior. Symptoms include:  Not being able to stay awake (drowsiness) or pay attention.  Being confused  about places, time, and people.  Forgetfulness.  Having extreme energy levels. These may be low or high.  Changes in sleep patterns.  Extreme mood swings, such as sudden anger or anxiety.  Focusing on things or ideas that are not important.  Rambling and senseless talking.  Difficulty speaking, understanding speech, or both.  Hallucinations.  Tremor or unsteady gait. Symptoms come and go throughout the day and are often worse at the end of the day. How is this diagnosed? People with delirium may not realize that they have the condition. Often, a family member or health care provider is the first person to notice the changes. This condition may be diagnosed based on a physical exam, health history, and tests.  The health care provider will obtain a detailed history. This may include questions about: ? Current symptoms. ? Medical conditions that you have. ? Medicines. ? Drug use.  The health care provider will perform a mental status test by: ? Asking questions to check for confusion. ? Watching for abnormal behavior.  The health care provider may also order lab tests or additional studies to determine the cause of the delirium. How is this treated? Treatment of delirium depends on the cause and severity. Delirium usually goes away within days or weeks of treating the underlying cause. In the meantime, do not leave the person alone because he or she may accidentally cause self-harm. This condition may be treated with supportive care, such as:  Increased light during the day and decreased light at night.  Low noise level.  Uninterrupted sleep.  A regular daily schedule.  Clocks and calendars to help with orientation.  Familiar objects, including the person's pictures and clothing.  Frequent visits from familiar family and friends.  A healthy diet.  Gentle exercise. In more severe cases of delirium, medicine may be prescribed to help the person keep calm and think  more clearly.   Follow these instructions at home:  Continue supportive care as told by a health care provider.  Take over-the-counter and prescription medicines only as told by your health care provider.  Ask a health care provider before using herbs or supplements.  Do not use alcohol or illegal drugs.  Keep all follow-up visits. This is important.   Contact a health care provider if:  Symptoms do not get better or they become worse.  New symptoms of delirium develop.  Caring for the person at home does not seem safe.  Eating, drinking, or communicating stops.  There are side effects of medicines, such as changes in sleep patterns, dizziness, weight gain, restlessness, movement changes, or tremors. Get help right away if:  The person has thoughts of harming self or  harming others.  There are serious side effects of medicine, such as: ? Swelling of the face, lips, tongue, or throat. ? Fever, confusion, muscle spasms, or seizures. If you ever feel like a loved one may hurt himself or herself or others, or shares thoughts about taking his or her own life, get help right away. You can go to your nearest emergency department or:  Call your local emergency services (911 in the [place]  Call a suicide crisis helpline, such as the [facility] at 865-304-5574. This is open 24 hours a day in the [place]  Text the Crisis Text Line at 682-472-2082 (in the [place].). Summary  Delirium is a state of mental confusion. It comes on quickly and causes significant changes in a person's thinking and behavior.  Delirium is a sign of a serious underlying medical condition.  Certain medical conditions or a long hospital stay may increase the risk of developing delirium.  Treatment of delirium involves treating the underlying cause and providing supportive treatments, such as a calm and familiar environment. This information is not intended to replace advice given to you by your  health care provider. Make sure you discuss any questions you have with your health care provider. Document Revised: 12/13/2019 Document Reviewed: 12/13/2019 Elsevier Patient Education  [place].

## 2021-01-22 NOTE — Progress Notes (Signed)
Occupational Therapy Treatment Patient Details Name: Hannah Khan MRN: [id number] DOB: [birth year] Today's Date: 01/22/2021    History of present illness Pt is a 65 y.o. female with recent R shoulder sx (12/30/20), now admitted 01/10/21 with AMS, somnolence. Pt with acute hypoxic respiratory failure, acute metabolic encephalopathy; potential polypharmacy. Head CT negative for acute findings. Liver failure. PMH includes afib, DM2, OSA, cirrhosis, obesity.   OT comments  Patient seen for OT/PT session.  Completed bed mobility with mod assist +2 to reach EOB, seated EOB engaged in self feeding tasks with up to min assist.  Wrapped fork/spoon in washcloth to improve grasp and control/coordination, but decreased strength using R UE to bring hand to mouth with max cueing for posture as pt fatigues (with increased forward head and L lateral lean). Requires +2 max/total assist for attempted sit to stand from EOB in stedy, max/total assist +2 for lateral scooting towards HOB.  Pt with decreased awareness of deficits and poor insight to fatigue levels. VSS during session.  Reports looking forward to short term rehab, as plan for dc today.  Will follow acutely.    Follow Up Recommendations  SNF;Supervision/Assistance - 24 hour    Equipment Recommendations  None recommended by OT    Recommendations for Other Services      Precautions / Restrictions Precautions Precautions: Fall Restrictions RUE Weight Bearing: Partial weight bearing Other Position/Activity Restrictions: Per patient report - can be partial weight bearing, but not full weight bearing yet; no ROM restrictions that she is aware, has a paper at home with exercises from Dr. Berenice Primas in [place]       Mobility Bed Mobility Overal bed mobility: Needs Assistance Bed Mobility: Rolling;Sidelying to Sit;Sit to Sidelying Rolling: +2 for physical assistance;Mod assist;+2 for safety/equipment Sidelying to sit: Mod assist;HOB elevated;+2 for physical  assistance     Sit to sidelying: Total assist;+2 for physical assistance General bed mobility comments: log roll to R EOB, multimodal cues for LUE on bed rail to assist and technique, good initiation for BLE movement with vcs/tcs but needs most assist with trunk to rise; increased assist to return to supine after activity 2/2 fatigue    Transfers Overall transfer level: Needs assistance   Transfers: Sit to/from Stand;Lateral/Scoot Transfers Sit to Stand: +2 physical assistance;Max assist;Total assist;+2 safety/equipment;From elevated surface        Lateral/Scoot Transfers: Max assist;+2 physical assistance;+2 safety/equipment;From elevated surface General transfer comment: attempted to stand from elevated bed height to [facility] but difficulty 2/2 fatigue vs cognition and body habitus, able to lift buttocks 1-2" from EOB but unable to stand fully upright x3 attempts; seated scooting along EOB 55f with transfer pad assist with max cues and bed slightly in trendelenburg for ease of scooting    Balance Overall balance assessment: Needs assistance Sitting-balance support: No upper extremity supported;Feet supported Sitting balance-Leahy Scale: Poor Sitting balance - Comments: EOB varying min guard to modA with cues for neutral cx posture and not to lean too far to L side as she fatigued needed more assist; EOB grossly 20 mins. Seated reaching/balance activity at EOB with variable assist needed.       Standing balance comment: unable this date                           ADL either performed or assessed with clinical judgement   ADL Overall ADL's : Needs assistance/impaired Eating/Feeding: Minimal assistance;Sitting;With adaptive utensils Eating/Feeding Details (indicate cue type and reason):  sitting EOB, min assist at times to scoop food; decreased grasp and provided built up utensil to improve grasp, difficulty reaching R UE towards mouth and fatigues easily                  Lower Body Dressing: Total assistance;Bed level Lower Body Dressing Details (indicate cue type and reason): to don socks, unable to stand today   Toilet Transfer Details (indicate cue type and reason): unable to achieve standing         Functional mobility during ADLs: Total assistance;Moderate assistance;Cueing for safety;Cueing for sequencing General ADL Comments: pt limited to EOB, unable to achieve full stand; focused on self feeding from EOB     Vision       Perception     Praxis      Cognition Arousal/Alertness: Awake/alert Behavior During Therapy: WFL for tasks assessed/performed Overall Cognitive Status: Impaired/Different from baseline Area of Impairment: Attention;Memory;Following commands;Safety/judgement;Awareness;Problem solving                   Current Attention Level: Sustained Memory: Decreased short-term memory;Decreased recall of precautions Following Commands: Follows one step commands consistently;Follows one step commands with increased time Safety/Judgement: Decreased awareness of deficits;Decreased awareness of safety Awareness: Emergent Problem Solving: Slow processing;Decreased initiation;Requires verbal cues;Requires tactile cues;Difficulty sequencing General Comments: decreased insight into abilities (when fatigues, seems unaware) but motivated to progress strength/endurance and oriented to self/situation/spouse (not further assessed); needs multimodal cues for all functional mobility tasks and slow to process        Exercises Exercises: General Lower Extremity General Exercises - Lower Extremity Ankle Circles/Pumps: AROM;Both;10 reps;Seated Long Arc Quad: AROM;Both;10 reps;Seated (rest breaks between reps of 3-4)   Shoulder Instructions       General Comments BP 131/80 (94) seated EOB and BP 119/68 (84) after return to supine; HR 101-115 bpm with exertion and RR 16-22 rpm; SpO2 97-99% on RA throughout; per spouse she is R hand  dominant    Pertinent Vitals/ Pain       Pain Assessment: No/denies pain Faces Pain Scale: No hurt Pain Location: back feels "itchy" but no pain Pain Intervention(s): Monitored during session  Home Living                                          Prior Functioning/Environment              Frequency  Min 2X/week        Progress Toward Goals  OT Goals(current goals can now be found in the care plan section)  Progress towards OT goals: Progressing toward goals  Acute Rehab OT Goals Patient Stated Goal: pt and husband agree ST rehab is optimal OT Goal Formulation: With patient/family  Plan Discharge plan remains appropriate;Frequency remains appropriate    Co-evaluation                 AM-PAC OT "6 Clicks" Daily Activity     Outcome Measure   Help from another person eating meals?: A Little Help from another person taking care of personal grooming?: A Lot Help from another person toileting, which includes using toliet, bedpan, or urinal?: Total Help from another person bathing (including washing, rinsing, drying)?: A Lot Help from another person to put on and taking off regular upper body clothing?: A Lot Help from another person to put on and taking off regular lower body  clothing?: Total 6 Click Score: 11    End of Session    OT Visit Diagnosis: Unsteadiness on feet (R26.81);Other abnormalities of gait and mobility (R26.89);Muscle weakness (generalized) (M62.81)   Activity Tolerance Patient tolerated treatment well   Patient Left in bed;with call bell/phone within reach;with bed alarm set;with family/visitor present;Other (comment) (bed in chair position)   Nurse Communication Mobility status        Time: 8001-2393 OT Time Calculation (min): 42 min  Charges: OT General Charges $OT Visit: 1 Visit OT Treatments $Self Care/Home Management : 23-37 mins  Jolaine Artist, OT Acute Rehabilitation Services Pager [phone] Office  [place] 01/22/2021, 1:50 PM

## 2021-01-23 ENCOUNTER — Encounter (HOSPITAL_COMMUNITY): Payer: Self-pay

## 2021-01-23 ENCOUNTER — Other Ambulatory Visit: Payer: Self-pay

## 2021-01-23 ENCOUNTER — Emergency Department (HOSPITAL_COMMUNITY)
Admission: EM | Admit: 2021-01-23 | Discharge: 2021-01-24 | Disposition: A | Discharge status: Home / Self Care | Payer: BC Managed Care – PPO | Attending: Emergency Medicine | Admitting: Emergency Medicine

## 2021-01-23 DIAGNOSIS — Z87891 Personal history of nicotine dependence: Secondary | ICD-10-CM | POA: Diagnosis not present

## 2021-01-23 DIAGNOSIS — I5043 Acute on chronic combined systolic (congestive) and diastolic (congestive) heart failure: Secondary | ICD-10-CM | POA: Diagnosis not present

## 2021-01-23 DIAGNOSIS — E119 Type 2 diabetes mellitus without complications: Secondary | ICD-10-CM | POA: Insufficient documentation

## 2021-01-23 DIAGNOSIS — Z760 Encounter for issue of repeat prescription: Secondary | ICD-10-CM | POA: Insufficient documentation

## 2021-01-23 DIAGNOSIS — Z7984 Long term (current) use of oral hypoglycemic drugs: Secondary | ICD-10-CM | POA: Diagnosis not present

## 2021-01-23 DIAGNOSIS — I110 Hypertensive heart disease with heart failure: Secondary | ICD-10-CM | POA: Diagnosis not present

## 2021-01-23 DIAGNOSIS — Z79899 Other long term (current) drug therapy: Secondary | ICD-10-CM | POA: Insufficient documentation

## 2021-01-23 NOTE — ED Triage Notes (Signed)
Patient came in Hannah Khan from [facility], reports she was discharged from the hospital yesterday to [facility] for rehab but has not been given her medication since discharge.

## 2021-01-24 ENCOUNTER — Other Ambulatory Visit: Payer: Self-pay | Admitting: Cardiovascular Disease

## 2021-01-24 MED ORDER — POTASSIUM CHLORIDE CRYS ER 20 MEQ PO TBCR
20.0000 meq | EXTENDED_RELEASE_TABLET | Freq: Once | ORAL | Status: AC
  Administered 2021-01-24: 20 meq via ORAL
  Filled 2021-01-24: qty 1

## 2021-01-24 MED ORDER — METOPROLOL TARTRATE 25 MG PO TABS
50.0000 mg | ORAL_TABLET | Freq: Two times a day (BID) | ORAL | Status: DC
  Administered 2021-01-24: 50 mg via ORAL
  Filled 2021-01-24: qty 2

## 2021-01-24 MED ORDER — BUMETANIDE 2 MG PO TABS: 2.0000 mg | ORAL_TABLET | Freq: Every day | ORAL | 0 refills | Status: DC

## 2021-01-24 MED ORDER — LACTULOSE 10 GM/15ML PO SOLN: 20.0000 g | Freq: Three times a day (TID) | ORAL | 0 refills | Status: AC

## 2021-01-24 MED ORDER — SPIRONOLACTONE 25 MG PO TABS: 25.0000 mg | ORAL_TABLET | Freq: Every day | ORAL | 0 refills | Status: DC

## 2021-01-24 MED ORDER — MELATONIN 5 MG PO TABS: 5.0000 mg | ORAL_TABLET | Freq: Every day | ORAL | 0 refills | Status: DC

## 2021-01-24 MED ORDER — BUMETANIDE 2 MG PO TABS
2.0000 mg | ORAL_TABLET | Freq: Every day | ORAL | Status: DC
  Administered 2021-01-24: 2 mg via ORAL
  Filled 2021-01-24 (×2): qty 1

## 2021-01-24 MED ORDER — LACTULOSE 10 GM/15ML PO SOLN
20.0000 g | Freq: Three times a day (TID) | ORAL | Status: DC
  Administered 2021-01-24: 20 g via ORAL
  Filled 2021-01-24 (×2): qty 30

## 2021-01-24 MED ORDER — ALUM & MAG HYDROXIDE-SIMETH 200-200-20 MG/5ML PO SUSP: 30.0000 mL | Freq: Four times a day (QID) | ORAL | 0 refills | Status: DC | PRN

## 2021-01-24 MED ORDER — DILTIAZEM HCL ER COATED BEADS 360 MG PO CP24: 360.0000 mg | ORAL_CAPSULE | Freq: Every day | ORAL | 0 refills | Status: DC

## 2021-01-24 MED ORDER — ONDANSETRON HCL 4 MG PO TABS: 4.0000 mg | ORAL_TABLET | Freq: Three times a day (TID) | ORAL | 0 refills | Status: DC | PRN

## 2021-01-24 MED ORDER — GLIMEPIRIDE 1 MG PO TABS: 1.0000 mg | ORAL_TABLET | Freq: Every day | ORAL | 0 refills | Status: DC

## 2021-01-24 MED ORDER — SPIRONOLACTONE 25 MG PO TABS
25.0000 mg | ORAL_TABLET | Freq: Every day | ORAL | Status: DC
  Administered 2021-01-24: 25 mg via ORAL
  Filled 2021-01-24 (×2): qty 1

## 2021-01-24 MED ORDER — ALBUTEROL SULFATE HFA 108 (90 BASE) MCG/ACT IN AERS: 2.0000 | INHALATION_SPRAY | Freq: Four times a day (QID) | RESPIRATORY_TRACT | 0 refills | Status: DC | PRN

## 2021-01-24 MED ORDER — RIFAXIMIN 550 MG PO TABS
550.0000 mg | ORAL_TABLET | Freq: Two times a day (BID) | ORAL | Status: DC
  Administered 2021-01-24: 550 mg via ORAL
  Filled 2021-01-24 (×2): qty 1

## 2021-01-24 MED ORDER — POTASSIUM CHLORIDE ER 10 MEQ PO CPCR: 20.0000 meq | ORAL_CAPSULE | Freq: Two times a day (BID) | ORAL | 0 refills | Status: DC

## 2021-01-24 MED ORDER — LIDOCAINE 5 % EX PTCH: 1.0000 | MEDICATED_PATCH | CUTANEOUS | 0 refills | Status: DC

## 2021-01-24 MED ORDER — METOPROLOL TARTRATE 50 MG PO TABS: 50.0000 mg | ORAL_TABLET | Freq: Two times a day (BID) | ORAL | 0 refills | Status: DC

## 2021-01-24 MED ORDER — RIFAXIMIN 550 MG PO TABS: 550.0000 mg | ORAL_TABLET | Freq: Two times a day (BID) | ORAL | 0 refills | Status: AC

## 2021-01-24 NOTE — ED Notes (Signed)
[facility] called  

## 2021-01-24 NOTE — TOC Initial Note (Addendum)
jTransition of Care [facility]) - Initial/Assessment Note    Patient Details  Name: Hannah Khan MRN: [id number] Date of Birth: Oct 16, 1955  Transition of Care [facility]) CM/SW Contact:    Verdell Carmine, RN Phone Number: 01/24/2021, 8:29 AM  Clinical Narrative:                  Consult received for patient whom had trouble with receiving correct medications at SNF and eloped. Came to ED to get medications straight and wantede to set up home health. Patient has [place] reached out to Adela Lank at [facility] for RN ( medication Management and PT per orders. Awaiting reply.[id number] Accepted from [facility] will call patient to let them know       Patient Goals and CMS Choice        Expected Discharge Plan and Services      PT and RN via Home health patient is discharged to home                                          Prior Living Arrangements/Services  was at SNF                     Activities of Daily Living      Permission Sought/Granted                  Emotional Assessment              Admission diagnosis:  Needs meds Patient Active Problem List   Diagnosis Date Noted  . Atrial fibrillation with RVR ([place]) 01/10/2021  . Acute encephalopathy 01/10/2021  . Secondary hypercoagulable state ([place]) 06/23/2020  . Streptococcal bacteremia 03/16/2020  . Chronic venous stasis dermatitis of both lower extremities 03/16/2020  . A-fib ([place]) 03/15/2020  . Acute lower UTI 04/26/2018  . Liver cirrhosis secondary to NASH (nonalcoholic steatohepatitis) ([place]) 04/26/2018  . Hyperlipidemia 04/26/2018  . Type 2 diabetes mellitus with other specified complication ([place])   . Cellulitis of right lower extremity 03/15/2018  . Severe sepsis ([place]) 03/14/2018  . Acute respiratory failure with hypoxia ([place]) 11/01/2016  . Community acquired pneumonia of right lower lobe of lung 10/31/2016  . Muscular abdominal pain in right upper quadrant 07/11/2016  . Acute on chronic  diastolic CHF (congestive heart failure) ([place]) 07/10/2016  . Thrombocytopenia ([place]) 07/10/2016  . Permanent atrial fibrillation ([place]) 01/30/2016  . Type 2 diabetes mellitus without complication, without long-term current use of insulin ([place]) 01/30/2016  . Acute on chronic combined systolic and diastolic CHF (congestive heart failure) ([place]) 01/26/2016  . Morbid obesity- 12/10/2015  . Persistent atrial fibrillation ([place]) 12/08/2015  . Melena 01/31/2015  . Aortic heart murmur 01/31/2015  . Pancytopenia ([place]) 07/23/2013  . Obstructive sleep apnea 08/07/2007  . Essential hypertension 08/07/2007  . ALLERGIC RHINITIS 08/07/2007   PCP:  Lujean Amel, MD Pharmacy:   [id number] [place] [address]Phone: [phone]: [address]Phone: [phone]: [phone]    Social Determinants of Health (SDOH) Interventions    Readmission Risk Interventions No flowsheet data found.

## 2021-01-24 NOTE — ED Provider Notes (Signed)
[facility] EMERGENCY DEPARTMENT Provider Note   CSN: [id number] Arrival date & time: 01/23/21  2311     History Chief Complaint  Patient presents with  . Medication Refill    Hannah Khan is a 65 y.o. female.  Recently discharged from here for afib and hepatic encephalopathy.  She was discharged to a nursing home but she and her husband state that she was not receiving her medications correctly.  She was upset about this and thus signed herself out and returns here for help.  I discussed with her that there was probably a mistake leaving the nursing home as there was nothing we could do in the hospital from that standpoint she states that she is going to try to do it at home.  She would request some help at home if possible.  Her husband agrees to this.  She has no new symptoms.  She states she is little bit more confused today than she was yesterday but not nearly what she was when she came into the hospital.   Medication Refill      Past Medical History:  Diagnosis Date  . Anemia    hx of  . Arthritis    knees  . Diabetes mellitus without complication ([place])    type 2  . Fibromyalgia   . HTN (hypertension)   . Hx of colonic polyps    s/p partial colectomy  . Hyperlipidemia   . Joint pain    in knees and back spasms  . Left leg swelling    wear compression hose  . Liver cirrhosis secondary to NASH (nonalcoholic steatohepatitis) ([place]) dx nov 2014   had enlarged spleen also   . Low blood pressure    bp varies  . Morbid obesity ([place])   . Persistent atrial fibrillation ([place])   . Pneumonia 10/2016  . PONV (postoperative nausea and vomiting)    in past none recent  . Portal hypertension ([place])   . Sleep apnea    uses cpap setting of 14  . Spleen enlarged   . 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 [facility])     Patient Active Problem List   Diagnosis Date Noted  . Atrial fibrillation with RVR ([place]) 01/10/2021  . Acute encephalopathy 01/10/2021  .  Secondary hypercoagulable state ([place]) 06/23/2020  . Streptococcal bacteremia 03/16/2020  . Chronic venous stasis dermatitis of both lower extremities 03/16/2020  . A-fib ([place]) 03/15/2020  . Acute lower UTI 04/26/2018  . Liver cirrhosis secondary to NASH (nonalcoholic steatohepatitis) ([place]) 04/26/2018  . Hyperlipidemia 04/26/2018  . Type 2 diabetes mellitus with other specified complication ([place])   . Cellulitis of right lower extremity 03/15/2018  . Severe sepsis ([place]) 03/14/2018  . Acute respiratory failure with hypoxia ([place]) 11/01/2016  . Community acquired pneumonia of right lower lobe of lung 10/31/2016  . Muscular abdominal pain in right upper quadrant 07/11/2016  . Acute on chronic diastolic CHF (congestive heart failure) ([place]) 07/10/2016  . Thrombocytopenia ([place]) 07/10/2016  . Permanent atrial fibrillation ([place]) 01/30/2016  . Type 2 diabetes mellitus without complication, without long-term current use of insulin ([place]) 01/30/2016  . Acute on chronic combined systolic and diastolic CHF (congestive heart failure) ([place]) 01/26/2016  . Morbid obesity- 12/10/2015  . Persistent atrial fibrillation ([place]) 12/08/2015  . Melena 01/31/2015  . Aortic heart murmur 01/31/2015  . Pancytopenia ([place]) 07/23/2013  . Obstructive sleep apnea 08/07/2007  . Essential hypertension 08/07/2007  . ALLERGIC RHINITIS 08/07/2007    Past Surgical History:  Procedure Laterality Date  . BREAST LUMPECTOMY WITH RADIOACTIVE SEED LOCALIZATION Right 08/29/2017   Procedure: RIGHT BREAST LUMPECTOMY WITH RADIOACTIVE SEEDS LOCALIZATION;  Surgeon: Erroll Luna, MD;  Location: [facility];  Service: General;  Laterality: Right;  . CESAREAN SECTION  1989  . CHOLECYSTECTOMY  20 yrs ago  . COLECTOMY  2011  . COLONOSCOPY    . COLONOSCOPY N/A 09/05/2018   Procedure: COLONOSCOPY;  Surgeon: [name], MD;  Location: [facility];  Service: Endoscopy;  Laterality: N/A;  . DILATATION & CURETTAGE/HYSTEROSCOPY WITH MYOSURE  N/A 05/06/2016   Procedure: DILATATION & CURETTAGE/HYSTEROSCOPY WITH MYOSURE WITH POLYPECTOMY;  Surgeon: Janyth Pupa, DO;  Location: [facility];  Service: Gynecology;  Laterality: N/A;  . ESOPHAGOGASTRODUODENOSCOPY (EGD) WITH PROPOFOL N/A 09/04/2013   Procedure: ESOPHAGOGASTRODUODENOSCOPY (EGD) WITH PROPOFOL;  Surgeon: [name], MD;  Location: [facility];  Service: Endoscopy;  Laterality: N/A;  . KNEE ARTHROSCOPY  yrs ago   bilateral, one done x 1, one done twice  . POLYPECTOMY  09/05/2018   Procedure: POLYPECTOMY;  Surgeon: [name], MD;  Location: [facility];  Service: Endoscopy;;     OB History   No obstetric history on file.     Family History  Problem Relation Age of Onset  . Heart failure Mother   . Cancer Mother   . Hyperlipidemia Mother   . Congestive Heart Failure Mother   . Stroke Mother   . Lung cancer Father   . Cancer Father   . Cancer Brother   . Hyperlipidemia Brother   . Stroke Other     Social History   Tobacco Use  . Smoking status: Former Smoker    Packs/day: 1.00    Years: 3.00    Pack years: 3.00    Types: Cigarettes    Quit date: 09/19/1974    Years since quitting: 46.3  . Smokeless tobacco: Never Used  Vaping Use  . Vaping Use: Never used  Substance Use Topics  . Alcohol use: Yes    Comment: occ  . Drug use: No    Home Medications Prior to Admission medications   Medication Sig Start Date End Date Taking? Authorizing Provider  Nystatin (GERHARDT'S BUTT CREAM) CREA Apply 1 application topically as needed for irritation. 01/22/21  Yes Vann, Jessica U, DO  albuterol (VENTOLIN HFA) 108 (90 Base) MCG/ACT inhaler Inhale 2 puffs into the lungs every 6 (six) hours as needed for wheezing or shortness of breath. 01/24/21   Novaleigh Kohlman, Corene Cornea, MD  alum & mag hydroxide-simeth (MAALOX/MYLANTA) 200-200-20 MG/5ML suspension Apply 30 mLs topically every 6 (six) hours as needed (skin). 01/24/21   Jaylin Roundy, Corene Cornea, MD  bumetanide (BUMEX) 2 MG tablet Take 1  tablet (2 mg total) by mouth daily. 01/24/21 02/23/21  Darl Kuss, Corene Cornea, MD  diltiazem (CARDIZEM CD) 360 MG 24 hr capsule Take 1 capsule (360 mg total) by mouth daily. 01/24/21 02/23/21  Viyaan Champine, Corene Cornea, MD  glimepiride (AMARYL) 1 MG tablet Take 1 tablet (1 mg total) by mouth daily with lunch. 01/24/21 02/23/21  Lassie Demorest, Corene Cornea, MD  lactulose (CHRONULAC) 10 GM/15ML solution Take 30 mLs (20 g total) by mouth 3 (three) times daily. 01/24/21 02/23/21  Babygirl Trager, Corene Cornea, MD  Lancets [facility] DELICA PLUS [id number]) MISC Apply topically 3 (three) times daily. 04/10/20   [provider]  lidocaine (LIDODERM) 5 % Place 1 patch onto the skin daily. Remove & Discard patch within 12 hours or as directed by MD 01/24/21   Maddoxx Burkitt, Corene Cornea, MD  melatonin 5 MG TABS Take 1 tablet (  5 mg total) by mouth at bedtime. 01/24/21 02/23/21  Elmire Amrein, Corene Cornea, MD  metoprolol tartrate (LOPRESSOR) 50 MG tablet Take 1 tablet (50 mg total) by mouth 2 (two) times daily. 01/24/21 02/23/21  Kerington Hildebrant, Corene Cornea, MD  ondansetron (ZOFRAN) 4 MG tablet Take 1 tablet (4 mg total) by mouth every 8 (eight) hours as needed for vomiting or nausea. 01/24/21 02/23/21  Lakina Mcintire, Corene Cornea, MD  [facility] VERIO test strip 1 each 3 (three) times daily. 04/13/20   [provider]  potassium chloride (KLOR-CON SPRINKLE) 10 MEQ CR capsule Take 2 capsules (20 mEq total) by mouth 2 (two) times daily. 01/24/21 02/23/21  Clebert Wenger, Corene Cornea, MD  rifaximin (XIFAXAN) 550 MG TABS tablet Take 1 tablet (550 mg total) by mouth 2 (two) times daily. 01/24/21 02/23/21  Chuckie Mccathern, Corene Cornea, MD  spironolactone (ALDACTONE) 25 MG tablet Take 1 tablet (25 mg total) by mouth daily. 01/24/21 02/23/21  Shere Eisenhart, Corene Cornea, MD    Allergies    Celecoxib, Shellfish allergy, Barium-containing compounds, Prednisone, Statins, Fentanyl, Ibuprofen, Tramadol, and Tylenol [acetaminophen]  Review of Systems   Review of Systems  All other systems reviewed and are negative.   Physical Exam Updated Vital Signs BP 128/71   Pulse 88   Temp 98.9 F  (37.2 C) (Oral)   Resp 12   Ht 5' 7"  (1.702 m)   Wt (!) 148.3 kg   SpO2 98%   BMI 51.21 kg/m   Physical Exam Vitals and nursing note reviewed.  Constitutional:      Appearance: She is well-developed.  HENT:     Head: Normocephalic and atraumatic.     Mouth/Throat:     Mouth: Mucous membranes are moist.     Pharynx: Oropharynx is clear.  Eyes:     Pupils: Pupils are equal, round, and reactive to light.  Cardiovascular:     Rate and Rhythm: Normal rate and regular rhythm.  Pulmonary:     Effort: No respiratory distress.     Breath sounds: No stridor.  Abdominal:     General: Abdomen is flat. There is no distension.  Musculoskeletal:        General: No swelling or tenderness. Normal range of motion.     Cervical back: Normal range of motion.  Skin:    General: Skin is warm and dry.  Neurological:     General: No focal deficit present.     Mental Status: She is alert. Mental status is at baseline.     ED Results / Procedures / Treatments   Labs (all labs ordered are listed, but only abnormal results are displayed) Labs Reviewed - No data to display  EKG None  Radiology No results found.  Procedures Procedures   Medications Ordered in ED Medications  bumetanide (BUMEX) tablet 2 mg (2 mg Oral Given 01/24/21 0112)  lactulose (CHRONULAC) 10 GM/15ML solution 20 g (20 g Oral Given 01/24/21 0112)  metoprolol tartrate (LOPRESSOR) tablet 50 mg (50 mg Oral Given 01/24/21 0112)  rifaximin (XIFAXAN) tablet 550 mg (550 mg Oral Given 01/24/21 0112)  spironolactone (ALDACTONE) tablet 25 mg (25 mg Oral Given 01/24/21 0112)  potassium chloride SA (KLOR-CON) CR tablet 20 mEq (20 mEq Oral Given 01/24/21 0112)    ED Course  I have reviewed the triage vital signs and the nursing notes.  Pertinent labs & imaging results that were available during my care of the patient were reviewed by me and considered in my medical decision making (see chart for details).    MDM  Rules/Calculators/A&P  Will give meds. Will consult TOC for home health care. Will refill Rx's for home.   Patient initially amenable to going home however on reevaluation they were concerned for her ability to get around at home and take care of her self.  I told her that we could offer PT and OT at home.  They thought this would be helpful.  However they were still concerned so I told him the other option would be to wait in the ER until case management social work came and they could see if there is no place they could get them placed into however they may end about to stay in the ER for 5 to 7 days wait for this to happen.  They decided not wanted to that and would prefer to have the at home services.  Those orders were placed.  Transition of care is already been consulted.  30-day prescriptions for all medications given.  Patient appears to be stable for discharge from my standpoint at this time.  Final Clinical Impression(s) / ED Diagnoses Final diagnoses:  Encounter for medication refill    Rx / DC Orders ED Discharge Orders         Ordered    albuterol (VENTOLIN HFA) 108 (90 Base) MCG/ACT inhaler  Every 6 hours PRN        01/24/21 0318    alum & mag hydroxide-simeth (MAALOX/MYLANTA) 200-200-20 MG/5ML suspension  Every 6 hours PRN        01/24/21 0318    bumetanide (BUMEX) 2 MG tablet  Daily        01/24/21 0318    diltiazem (CARDIZEM CD) 360 MG 24 hr capsule  Daily        01/24/21 0318    glimepiride (AMARYL) 1 MG tablet  Daily with lunch        01/24/21 0318    lactulose (CHRONULAC) 10 GM/15ML solution  3 times daily        01/24/21 0318    melatonin 5 MG TABS  Daily at bedtime        01/24/21 0318    lidocaine (LIDODERM) 5 %  Every 24 hours        01/24/21 0318    metoprolol tartrate (LOPRESSOR) 50 MG tablet  2 times daily        01/24/21 0318    ondansetron (ZOFRAN) 4 MG tablet  Every 8 hours PRN        01/24/21 0318    potassium chloride  (KLOR-CON SPRINKLE) 10 MEQ CR capsule  2 times daily        01/24/21 0318    rifaximin (XIFAXAN) 550 MG TABS tablet  2 times daily        01/24/21 0318    spironolactone (ALDACTONE) 25 MG tablet  Daily        01/24/21 [place]        01/24/21 0320    Face-to-face encounter (required for Medicare/Medicaid patients)       Comments: I Merrily Pew certify that this patient is under my care and that I, or a nurse practitioner or physician's assistant working with me, had a face-to-face encounter that meets the physician face-to-face encounter requirements with this patient on 01/24/2021. The encounter with the patient was in whole, or in part for the following medical condition(s) which is the primary reason for home health care (List medical condition): hepatic encephalopathy, afib, weakness, further orders per PCP.   01/24/21 1025  albuterol (VENTOLIN HFA) 108 (90 Base) MCG/ACT inhaler  Every 6 hours PRN        Pending    alum & mag hydroxide-simeth (MAALOX/MYLANTA) 200-200-20 MG/5ML suspension  Every 6 hours PRN        Pending    bumetanide (BUMEX) 2 MG tablet  Daily        Pending    diltiazem (CARDIZEM CD) 360 MG 24 hr capsule  Daily        Pending    glimepiride (AMARYL) 1 MG tablet  Daily with lunch        Pending    lactulose (CHRONULAC) 10 GM/15ML solution  3 times daily        Pending    lidocaine (LIDODERM) 5 %  Every 24 hours        Pending    melatonin 5 MG TABS  Daily at bedtime        Pending    metoprolol tartrate (LOPRESSOR) 50 MG tablet  2 times daily        Pending    ondansetron (ZOFRAN) 4 MG tablet  Every 8 hours PRN        Pending    potassium chloride (KLOR-CON SPRINKLE) 10 MEQ CR capsule  2 times daily        Pending    rifaximin (XIFAXAN) 550 MG TABS tablet  2 times daily        Pending    spironolactone (ALDACTONE) 25 MG tablet  Daily        Pending           Ivone Licht, Corene Cornea, MD 01/24/21 [phone]

## 2021-02-04 ENCOUNTER — Telehealth: Payer: Self-pay | Admitting: Cardiovascular Disease

## 2021-02-04 NOTE — Telephone Encounter (Signed)
Called patient in regards to her weight gain. Patient was in hospital and discharged- they made changes to her medications and since those changes she has had a 7 lb weight gain in a week. She has swelling located in her legs. She denies shortness of breath, chest pain/discomfort, BP this AM was 79/46 HR 83, physical therapist was there and rechecked and it was 102/58 around 9:30 this AM. Patient denies dizziness/lightheadedness. She states in the hospital they took her off the Metolazone that she took every Thursday, and decreased the Bumex to 1 tab daily, she was taking 2 tabs daily and they increase Diltiazem to 360 mg.   Went over medication list with patient: Bumex 2 mg- takes in the AM Diltiazem 360 mg- takes in the AM Metoprolol 50 mg BID- takes one in AM, and one in PM Spironolactone 25 mg- takes in the AM Potassium- takes 2 in the AM, and 2 in the PM.   Patient states she believes the changes in meds have caused her issues.  Patient aware if any new or worsening symptoms to go back to hospital for evaluation.  Patient verbalized understanding, will call with recommendations from MD.

## 2021-02-04 NOTE — Telephone Encounter (Signed)
Pt c/o swelling: STAT is pt has developed SOB within 24 hours  1) How much weight have you gained and in what time span? 7 pounds in a week  2) If swelling, where is the swelling located? legs  3) Are you currently taking a fluid pill? yes  4) Are you currently SOB? No   5) Do you have a log of your daily weights (if so, list)? Yes keeps going up [phone]  6) Have you gained 3 pounds in a day or 5 pounds in a week? yes  7) Have you traveled recently? No    Hannah Khan from [facility] please call patient [phone]

## 2021-02-04 NOTE — Telephone Encounter (Signed)
Pt c/o medication issue:  1. Name of Medication: bumetanide (BUMEX) 2 MG tablet  2. How are you currently taking this medication (dosage and times per day)? As prescribed   3. Are you having a reaction (difficulty breathing--STAT)? No   4. What is your medication issue? Pt is calling with concerns that she wanted to let [place] know about.She states that while she was hospitalized they changed her medication.

## 2021-02-05 NOTE — Telephone Encounter (Signed)
See other phone note from 5/19

## 2021-02-11 ENCOUNTER — Other Ambulatory Visit: Payer: Self-pay | Admitting: Gastroenterology

## 2021-02-11 DIAGNOSIS — K7469 Other cirrhosis of liver: Secondary | ICD-10-CM

## 2021-02-15 NOTE — Progress Notes (Signed)
Cardiology Clinic Note   Patient Name: Hannah Khan Date of Encounter: 02/17/2021  Primary Care Provider:  Lujean Amel, MD Primary Cardiologist:  Hannah Latch, MD  Patient Profile    Hannah Khan 65 year old female presents the clinic today for follow-up evaluation of her acute on chronic diastolic CHF.  Past Medical History    Past Medical History:  Diagnosis Date  . Anemia    hx of  . Arthritis    knees  . Diabetes mellitus without complication ([place])    type 2  . Fibromyalgia   . HTN (hypertension)   . Hx of colonic polyps    s/p partial colectomy  . Hyperlipidemia   . Joint pain    in knees and back spasms  . Left leg swelling    wear compression hose  . Liver cirrhosis secondary to NASH (nonalcoholic steatohepatitis) ([place]) dx nov 2014   had enlarged spleen also   . Low blood pressure    bp varies  . Morbid obesity ([place])   . Persistent atrial fibrillation ([place])   . Pneumonia 10/2016  . PONV (postoperative nausea and vomiting)    in past none recent  . Portal hypertension ([place])   . Sleep apnea    uses cpap setting of 14  . Spleen enlarged   . 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 [facility])    Past Surgical History:  Procedure Laterality Date  . BREAST LUMPECTOMY WITH RADIOACTIVE SEED LOCALIZATION Right 08/29/2017   Procedure: RIGHT BREAST LUMPECTOMY WITH RADIOACTIVE SEEDS LOCALIZATION;  Surgeon: Hannah Luna, MD;  Location: [place];  Service: General;  Laterality: Right;  . CESAREAN SECTION  1989  . CHOLECYSTECTOMY  20 yrs ago  . COLECTOMY  2011  . COLONOSCOPY    . COLONOSCOPY N/A 09/05/2018   Procedure: COLONOSCOPY;  Surgeon: Hannah Silence, MD;  Location: [facility];  Service: Endoscopy;  Laterality: N/A;  . DILATATION & CURETTAGE/HYSTEROSCOPY WITH MYOSURE N/A 05/06/2016   Procedure: DILATATION & CURETTAGE/HYSTEROSCOPY WITH MYOSURE WITH POLYPECTOMY;  Surgeon: Hannah Pupa, DO;  Location: [facility];  Service: Gynecology;  Laterality: N/A;  .  ESOPHAGOGASTRODUODENOSCOPY (EGD) WITH PROPOFOL N/A 09/04/2013   Procedure: ESOPHAGOGASTRODUODENOSCOPY (EGD) WITH PROPOFOL;  Surgeon: Hannah Silence, MD;  Location: [facility];  Service: Endoscopy;  Laterality: N/A;  . KNEE ARTHROSCOPY  yrs ago   bilateral, one done x 1, one done twice  . POLYPECTOMY  09/05/2018   Procedure: POLYPECTOMY;  Surgeon: Hannah Silence, MD;  Location: [facility];  Service: Endoscopy;;    Allergies  Allergies  Allergen Reactions  . Celecoxib Other (See Comments)    "speech slurred" with celebrex  . Shellfish Allergy Swelling and Rash    TINGLING AND SWELLING OF LIPS. LARGE WHELPS QUARTER-SIZE  . Barium-Containing Compounds Other (See Comments)    TACHYCARDIA  . Prednisone Other (See Comments)    TACHYCARDIA  . Statins Other (See Comments)    AGGRAVATED FIBROMYALGIA  . Fentanyl     Hallucinations   . Ibuprofen Other (See Comments)    UNSPECIFIED SPECIFIC REACTION >> "DUE TO PLATELETS"  . Tramadol     Hallucination   . Tylenol [Acetaminophen] Other (See Comments)    UNSPECIFIED SPECIFIC REACTION >> "DUE TO PLATELETS"    History of Present Illness    [name] has a PMH of morbid obesity (BMI 58.25), cirrhosis with thrombocytopenia, chronic atrial fibrillation. She does not take anticoagulants due to high risk for bleeding. Her PMH also includes diabetes mellitus type 2 and chronic diastolic heart  failure. She was last seen by Hannah Ransom, PA-C on 02/26/2020. 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 During that time she was on a break from school and was compliant with her diuretics. She followed up with Dr. Oval Khan 4/21. Since her follow-up with Dr. Oval Khan she had lost 7 pounds. It was noted that her lower extremity edema was somewhat improved. She indicated that she was seeing her OB/GYN for urine incontinence and vaginal bleeding.  She  presented to the emergency department on 03/14/2020-03/18/2020. She had contacted EMS due to dizziness and tachycardia. She stated that she has had the symptoms for a few weeks. She also noted a right-sided abdominal discomfort. Her temperature in the emergency room was 100.8 and her heart rate was elevated 240-260. Blood pressure was in the 140s. Her CT was negative for PE, pulmonary hypertension and splenomegaly were noted. Her white blood cell count at that time was 8.7 hemoglobin 12.4 platelet count was 32. Her UA was positive for blood. She was noted to have a Streptococcus mitis bacterium that was being followed by infectious disease. She was treated with Rocephin during hospital stay and changed to amoxicillin for 9 days at discharge. She was asked to follow-up with GI for colonoscopy and screening to rule out colon cancer due to its association with Streptococcus mitis. Her atrial fibrillation with RVR was treated with a diltiazem gtt. and transition to metoprolol. She had previously been on nadolol at home.  She presented to the clinic 04/14/2020 for follow-up evaluation and statedher breathing felt about the same as it did when she left the hospital on 03/18/2020. She indicated that she had more shortness of breath with physical activity and stated somewhat short of breath at rest. Her weight today was 384 pounds and her discharge weight was 371 pounds. She states that she has been trying to weigh at home but her scale is not currently working. She is going to put new batteries in her scale. She stated that she was following a low-sodium diet and that her husband followed the same heart healthy low-sodium diet. Her heart rate today was 110 bpm. On review of her phone app that has been around 97-104 routinely. I will increased her Bumex to 4 mg twice daily for the next 3 days and add potassium supplementation. I will had her adhere to a fluid restriction of no more than 64 ounces per  day, weigh herself daily,  wear lower extremity support stockings through the day and take them off at night, and planned follow-up in 1 week. BMP in 1 week.  She presented to the clinic 05/13/2020 for follow-up evaluation and stated that her lower extremity edema was much improved.  She did however have mornings with low blood pressure.  She stated that she felt fatigued in the morning until her blood pressure began to elevate and she felt like she could complete her daily tasks.  She had been adhering to a low-sodium diet and limiting her fluid intake.  Her weight was much improved  at 373 pounds.  I  encouraged her to continue her physical activity, weight loss, and diet.  We planned to have her contact the office if her blood pressures dipped below 90/50.  Maintain blood pressure log.  I have also gave her the salty 6 diet sheet.  I  decreased her diltiazem to 300 mg daily.  We wplanned her follow-up  with Dr. Oval Khan or myself in 3 months.  She was seen by Dr. Lovena Le 08/05/2020.  She was referred by  Adline Peals for evaluation of her atrial fibrillation with RVR.  During that time she continued to do well since starting metolazone.  She denied palpitations.  She reported that she would check her heart rate and it would mainly be below 100 bpm.  She continued to not being on systemic anticoagulation due to cirrhosis and variceal bleeding.  She had not been able to lose much weight.  She denied syncope.  Her ventricular rate was controlled with combination of Cardizem and metoprolol.  AV nodal ablation and PPM were discussed.  It was felt that medical management was most appropriate course of treatment.  She was admitted to the hospital on 01/23/2021 and discharged on 01/24/2021 with atrial fibrillation and hepatic encephalopathy.  Her and her husband indicated that she had been discharged previously to a nursing facility but that she was not receiving her medications correctly.  They were upset, signed her  out of the nursing home, and presented to the emergency department for evaluation and treatment.  She wished to return home and have her husband help manage her care.  She had no new symptoms.  She indicated that she had been of more confused but relatively doing well.  While in the emergency department her Bumex was changed to 1 tab daily and her metolazone (every Thursday) was discontinued.  She contacted the nurse triage line on 02/04/2021 and indicated that she had a 7 pound weight gain.  Her medications were reviewed:  Bumex 2 mg- takes in the AM Diltiazem 360 mg- takes in the AM Metoprolol 50 mg BID- takes one in AM, and one in PM Spironolactone 25 mg- takes in the AM Potassium- takes 2 in the AM, and 2 in the PM   She presents the clinic today for follow-up evaluation and states she is doing much better since she is returned home from the nursing facility.  She reports that she was not receiving her medication as prescribed.  Specifically she was not receiving her lactulose and was noticing more confusion.  Since being home her weight is much better and her mentation is also much better.  She is working with physical therapy 2 times per week.  She has been weighing herself daily.  We will refill her medications, continue her low-salt diet, and fluid restriction.  We will order a BMP today and have her follow-up in 3 to 4 months with Dr. Oval Khan.  Today shedenies chest pain,increasedshortness of breath,increasedlower extremity edema, fatigue, palpitations, melena, hematuria, hemoptysis, diaphoresis, weakness, presyncope, syncope, orthopnea, and PND.  Home Medications    Prior to Admission medications   Medication Sig Start Date End Date Taking? Authorizing Provider  albuterol (VENTOLIN HFA) 108 (90 Base) MCG/ACT inhaler Inhale 2 puffs into the lungs every 6 (six) hours as needed for wheezing or shortness of breath. 01/24/21   Mesner, Corene Cornea, MD  alum & mag hydroxide-simeth  (MAALOX/MYLANTA) 200-200-20 MG/5ML suspension Apply 30 mLs topically every 6 (six) hours as needed (skin). 01/24/21   Mesner, Corene Cornea, MD  bumetanide (BUMEX) 2 MG tablet Take 1 tablet (2 mg total) by mouth daily. 01/24/21 02/23/21  Mesner, Corene Cornea, MD  diltiazem (CARDIZEM CD) 360 MG 24 hr capsule Take 1 capsule (360 mg total) by mouth daily. 01/24/21 02/23/21  Mesner, Corene Cornea, MD  glimepiride (AMARYL) 1 MG tablet Take 1 tablet (1 mg total) by mouth daily with lunch. 01/24/21  02/23/21  Mesner, Corene Cornea, MD  lactulose (CHRONULAC) 10 GM/15ML solution Take 30 mLs (20 g total) by mouth 3 (three) times daily. 01/24/21 02/23/21  Mesner, Corene Cornea, MD  Lancets [facility] DELICA PLUS [id number]) MISC Apply topically 3 (three) times daily. 04/10/20   [provider]  lidocaine (LIDODERM) 5 % Place 1 patch onto the skin daily. Remove & Discard patch within 12 hours or as directed by MD 01/24/21   Mesner, Corene Cornea, MD  melatonin 5 MG TABS Take 1 tablet (5 mg total) by mouth at bedtime. 01/24/21 02/23/21  Mesner, Corene Cornea, MD  metoprolol tartrate (LOPRESSOR) 50 MG tablet Take 1 tablet (50 mg total) by mouth 2 (two) times daily. 01/24/21 02/23/21  Mesner, Corene Cornea, MD  Nystatin (GERHARDT'S BUTT CREAM) CREA Apply 1 application topically as needed for irritation. 01/22/21   Geradine Girt, DO  ondansetron (ZOFRAN) 4 MG tablet Take 1 tablet (4 mg total) by mouth every 8 (eight) hours as needed for vomiting or nausea. 01/24/21 02/23/21  Mesner, Corene Cornea, MD  [facility] VERIO test strip 1 each 3 (three) times daily. 04/13/20   [provider]  potassium chloride (MICRO-K) 10 MEQ CR capsule TAKE 4 TABLETS BY MOUTH DAILY 01/25/21   Hannah Latch, MD  rifaximin (XIFAXAN) 550 MG TABS tablet Take 1 tablet (550 mg total) by mouth 2 (two) times daily. 01/24/21 02/23/21  Mesner, Corene Cornea, MD  spironolactone (ALDACTONE) 25 MG tablet Take 1 tablet (25 mg total) by mouth daily. 01/24/21 02/23/21  Mesner, Corene Cornea, MD    Family History    Family History  Problem Relation Age of Onset   . Heart failure Mother   . Cancer Mother   . Hyperlipidemia Mother   . Congestive Heart Failure Mother   . Stroke Mother   . Lung cancer Father   . Cancer Father   . Cancer Brother   . Hyperlipidemia Brother   . Stroke Other    She indicated that her mother is deceased. She indicated that her father is deceased. She indicated that the status of her brother is unknown. She indicated that her maternal grandmother is deceased. She indicated that her maternal grandfather is deceased. She indicated that her paternal grandmother is deceased. She indicated that her paternal grandfather is deceased. She indicated that the status of her other is unknown.  Social History    Social History   Socioeconomic History  . Marital status: Married    Spouse name: Not on file  . Number of children: Not on file  . Years of education: Not on file  . Highest education level: Not on file  Occupational History  . Occupation: Merchant navy officer  Tobacco Use  . Smoking status: Former Smoker    Packs/day: 1.00    Years: 3.00    Pack years: 3.00    Types: Cigarettes    Quit date: 09/19/1974    Years since quitting: 46.4  . Smokeless tobacco: Never Used  Vaping Use  . Vaping Use: Never used  Substance and Sexual Activity  . Alcohol use: Yes    Comment: occ  . Drug use: No  . Sexual activity: Not on file  Other Topics Concern  . Not on file  Social History Narrative   Lives in [place] with her husband.  Instructor for early childhood development.     Social Determinants of Health   Financial Resource Strain: Not on file  Food Insecurity: Not on file  Transportation Needs: Not on file  Physical Activity: Not on  file  Stress: Not on file  Social Connections: Not on file  Intimate Partner Violence: Not on file     Review of Systems    General:  No chills, fever, night sweats or weight changes.  Cardiovascular:  No chest pain, dyspnea on exertion, edema, orthopnea, palpitations, paroxysmal  nocturnal dyspnea. Dermatological: No rash, lesions/masses Respiratory: No cough, dyspnea Urologic: No hematuria, dysuria Abdominal:   No nausea, vomiting, diarrhea, bright red blood per rectum, melena, or hematemesis Neurologic:  No visual changes, wkns, changes in mental status. All other systems reviewed and are otherwise negative except as noted above.  Physical Exam    VS:  BP 93/61   Pulse 71   Wt (!) 348 lb 9.6 oz (158.1 kg)   SpO2 100%   BMI 54.60 kg/m  , BMI Body mass index is 54.6 kg/m. GEN: Well nourished, well developed, in no acute distress. HEENT: normal. Neck: Supple, no JVD, carotid bruits, or masses. Cardiac: RRR, no murmurs, rubs, or gallops. No clubbing, cyanosis, edema.  Radials/DP/PT 2+ and equal bilaterally.  Respiratory:  Respirations regular and unlabored, clear to auscultation bilaterally. GI: Soft, nontender, nondistended, BS + x 4. MS: no deformity or atrophy. Skin: warm and dry, no rash. Neuro:  Strength and sensation are intact. Psych: Normal affect.  Accessory Clinical Findings    Recent Labs: 01/10/2021: B Natriuretic Peptide 359.8; TSH 1.135 01/20/2021: Magnesium 2.0 01/21/2021: ALT 23 01/22/2021: BUN 12; Creatinine, Ser 0.84; Hemoglobin 13.9; Platelets PLATELET CLUMPS NOTED ON SMEAR, UNABLE TO ESTIMATE; Potassium 3.6; Sodium 137   Recent Lipid Panel    Component Value Date/Time   CHOL 175 07/02/2014 1138   TRIG 93 07/02/2014 1138   HDL 59 07/02/2014 1138   CHOLHDL 3.0 07/02/2014 1138   VLDL 19 07/02/2014 1138   LDLCALC 97 07/02/2014 1138    ECG personally reviewed by me today-none today.  EKG 05/13/2020 atrial fibrillation incomplete right bundle branch block anterior septal infarct undetermined age 44 bpm  EKG 03/14/2020 Atrial fibrillation left anterior fascicular block 125 bpm  Echocardiogram 03/17/2020 IMPRESSIONS    1. Left ventricular ejection fraction, by estimation, is 55 to 60%. The  left ventricle has normal function. The  left ventricle has no regional  wall motion abnormalities. Left ventricular diastolic function could not  be evaluated.  2. Right ventricular systolic function is normal. The right ventricular  size is moderately enlarged. There is normal pulmonary artery systolic  pressure.  3. The mitral valve is normal in structure. No evidence of mitral valve  regurgitation.  4. Unable to determine valve morphology due to image quality. Aortic  valve regurgitation is not visualized. No aortic stenosis is present.   Conclusion(s)/Recommendation(s): No evidence of valvular vegetations on  this transthoracic echocardiogram. Would recommend a transesophageal  echocardiogram to exclude infective endocarditis if clinically indicated.  Poor technical quality due to body  habitus.  Assessment & Plan   1.  Acute on chronic diastolic CHF-no increased work of breathing or activity intolerance today.  Weight today 348.6.    Continue Bumex, spironolactone Heart healthy low-sodium diet-salty 6 given Increase physical activity as tolerated Fluid restriction 64 ounces maximum daily Continue lower extremity support stockings Continue daily weights Order BMP  Hypotension-BP 93/61 today.  Blood pressures been stable at home 100s over 50s.  Continue metoprolol, diltiazem Heart healthy diet  Persistent atrial fibrillation-heart rate today71 bpm. No recent episodes of dizziness or increased heart rate. No anticoagulation due to increased bleeding risk. Elevated heart rate multifactorial in  nature atrial fibrillation/morbid obesity. Patient has been monitoring at home and has stayed in the mid 90s Continuediltiazem, metoprolol, Avoid triggers caffeine, chocolate, EtOH etc. Heart healthy low-sodium diet-salty 6 given Increase physical activity as tolerated Follows with atrial fibrillation clinic  Morbid obesity-weight TKTCC883.6 pounds. Continueweight loss Heart healthy low-sodium diet-salty 6  given Increase physical activity as tolerated  Type 2 diabetes- A1c 5.1 ContinueJardiance Heart healthy low-sodiumcarbohydrate modified Increase physical activity as tolerated Follows with PCP  Liver cirrhosis secondary to nonalcoholic steatohepatitis-thrombocytopenia platelets 45 on 03/18/2020  Followed byOB/GYN and GI.  Disposition: Follow-up with Dr. Oval Khan  in 3-4 months.   Jossie Ng. Nechama Escutia NP-C    02/17/2021, 9:16 AM [address] Office [phone] Fax [phone]  Notice: This dictation was prepared with Dragon dictation along with smaller phrase technology. Any transcriptional errors that result from this process are unintentional and may not be corrected upon review.  I spent 15 minutes examining this patient, reviewing medications, and using patient centered shared decision making involving her cardiac care.  Prior to her visit I spent greater than 20 minutes reviewing her past medical history,  medications, and prior cardiac tests.

## 2021-02-17 ENCOUNTER — Other Ambulatory Visit: Payer: Self-pay

## 2021-02-17 ENCOUNTER — Encounter: Payer: Self-pay | Admitting: General Practice

## 2021-02-17 ENCOUNTER — Ambulatory Visit: Payer: BC Managed Care – PPO | Admitting: General Practice

## 2021-02-17 VITALS — BP 93/61 | HR 71 | Wt 348.6 lb

## 2021-02-17 DIAGNOSIS — I4819 Other persistent atrial fibrillation: Secondary | ICD-10-CM | POA: Diagnosis not present

## 2021-02-17 DIAGNOSIS — I10 Essential (primary) hypertension: Secondary | ICD-10-CM | POA: Diagnosis not present

## 2021-02-17 DIAGNOSIS — I5043 Acute on chronic combined systolic (congestive) and diastolic (congestive) heart failure: Secondary | ICD-10-CM | POA: Diagnosis not present

## 2021-02-17 DIAGNOSIS — E1169 Type 2 diabetes mellitus with other specified complication: Secondary | ICD-10-CM

## 2021-02-17 DIAGNOSIS — K7581 Nonalcoholic steatohepatitis (NASH): Secondary | ICD-10-CM

## 2021-02-17 DIAGNOSIS — K7460 Unspecified cirrhosis of liver: Secondary | ICD-10-CM

## 2021-02-17 LAB — BASIC METABOLIC PANEL
BUN/Creatinine Ratio: 17 (ref 12–28)
BUN: 16 mg/dL (ref 8–27)
CO2: 24 mmol/L (ref 20–29)
Calcium: 9.3 mg/dL (ref 8.7–10.3)
Chloride: 101 mmol/L (ref 96–106)
Creatinine, Ser: 0.96 mg/dL (ref 0.57–1.00)
Glucose: 140 mg/dL — ABNORMAL HIGH (ref 65–99)
Potassium: 4 mmol/L (ref 3.5–5.2)
Sodium: 139 mmol/L (ref 134–144)
eGFR: 66 mL/min/{1.73_m2} (ref >59)

## 2021-02-17 MED ORDER — METOPROLOL TARTRATE 50 MG PO TABS: 50.0000 mg | ORAL_TABLET | Freq: Two times a day (BID) | ORAL | 1 refills | Status: DC

## 2021-02-17 MED ORDER — DILTIAZEM HCL ER COATED BEADS 360 MG PO CP24: 360.0000 mg | ORAL_CAPSULE | Freq: Every day | ORAL | 1 refills | Status: DC

## 2021-02-17 MED ORDER — SPIRONOLACTONE 25 MG PO TABS: 25.0000 mg | ORAL_TABLET | Freq: Every day | ORAL | 1 refills | Status: DC

## 2021-02-17 MED ORDER — BUMETANIDE 2 MG PO TABS: 2.0000 mg | ORAL_TABLET | Freq: Every day | ORAL | 1 refills | Status: DC

## 2021-02-17 NOTE — Telephone Encounter (Signed)
Patient seen today

## 2021-02-17 NOTE — Patient Instructions (Signed)
Medication Instructions:  Diltiazem, spironolactone, metoprolol, bumex refilled.  *If you need a refill on your cardiac medications before your next appointment, please call your pharmacy*   Lab Work: BMET today.     Testing/Procedures: None ordered.    Follow-Up: At [facility], you and your health needs are our priority.  As part of our continuing mission to provide you with exceptional heart care, we have created designated Provider Care Teams.  These Care Teams include your primary Cardiologist (physician) and Advanced Practice Providers (APPs -  Physician Assistants and Nurse Practitioners) who all work together to provide you with the care you need, when you need it.  We recommend signing up for the patient portal called "MyChart".  Sign up information is provided on this After Visit Summary.  MyChart is used to connect with patients for Virtual Visits (Telemedicine).  Patients are able to view lab/test results, encounter notes, upcoming appointments, etc.  Non-urgent messages can be sent to your provider as well.   To learn more about what you can do with MyChart, go to [email or web address].    Your next appointment:   3-4 month(s)  The format for your next appointment:   In Person  Provider:   Skeet Latch, MD   Other Instructions Take daily weights. Review "salty six" information sheet.

## 2021-03-09 ENCOUNTER — Ambulatory Visit
Admission: RE | Admit: 2021-03-09 | Discharge: 2021-03-09 | Disposition: A | Discharge status: Home / Self Care | Payer: BC Managed Care – PPO | Source: Ambulatory Visit | Attending: Gastroenterology | Admitting: Gastroenterology

## 2021-03-09 DIAGNOSIS — K7469 Other cirrhosis of liver: Secondary | ICD-10-CM

## 2021-03-17 ENCOUNTER — Emergency Department (HOSPITAL_COMMUNITY): Payer: BC Managed Care – PPO

## 2021-03-17 ENCOUNTER — Other Ambulatory Visit: Payer: Self-pay

## 2021-03-17 ENCOUNTER — Encounter (HOSPITAL_COMMUNITY): Payer: Self-pay

## 2021-03-17 ENCOUNTER — Inpatient Hospital Stay (HOSPITAL_COMMUNITY)
Admission: EM | Admit: 2021-03-17 | Discharge: 2021-03-22 | DRG: 193 | Disposition: A | Discharge status: Home / Self Care | Payer: BC Managed Care – PPO | Attending: Internal Medicine | Admitting: Internal Medicine

## 2021-03-17 DIAGNOSIS — I10 Essential (primary) hypertension: Secondary | ICD-10-CM | POA: Diagnosis present

## 2021-03-17 DIAGNOSIS — L03113 Cellulitis of right upper limb: Secondary | ICD-10-CM | POA: Diagnosis present

## 2021-03-17 DIAGNOSIS — E785 Hyperlipidemia, unspecified: Secondary | ICD-10-CM | POA: Diagnosis present

## 2021-03-17 DIAGNOSIS — I5043 Acute on chronic combined systolic (congestive) and diastolic (congestive) heart failure: Secondary | ICD-10-CM | POA: Diagnosis present

## 2021-03-17 DIAGNOSIS — Z801 Family history of malignant neoplasm of trachea, bronchus and lung: Secondary | ICD-10-CM

## 2021-03-17 DIAGNOSIS — Z87891 Personal history of nicotine dependence: Secondary | ICD-10-CM

## 2021-03-17 DIAGNOSIS — M797 Fibromyalgia: Secondary | ICD-10-CM | POA: Diagnosis present

## 2021-03-17 DIAGNOSIS — I4819 Other persistent atrial fibrillation: Secondary | ICD-10-CM | POA: Diagnosis present

## 2021-03-17 DIAGNOSIS — G4733 Obstructive sleep apnea (adult) (pediatric): Secondary | ICD-10-CM | POA: Diagnosis present

## 2021-03-17 DIAGNOSIS — E119 Type 2 diabetes mellitus without complications: Secondary | ICD-10-CM

## 2021-03-17 DIAGNOSIS — Z823 Family history of stroke: Secondary | ICD-10-CM

## 2021-03-17 DIAGNOSIS — I4891 Unspecified atrial fibrillation: Secondary | ICD-10-CM

## 2021-03-17 DIAGNOSIS — Z66 Do not resuscitate: Secondary | ICD-10-CM | POA: Diagnosis present

## 2021-03-17 DIAGNOSIS — E876 Hypokalemia: Secondary | ICD-10-CM | POA: Diagnosis present

## 2021-03-17 DIAGNOSIS — Z91013 Allergy to seafood: Secondary | ICD-10-CM

## 2021-03-17 DIAGNOSIS — J9601 Acute respiratory failure with hypoxia: Secondary | ICD-10-CM | POA: Diagnosis present

## 2021-03-17 DIAGNOSIS — J189 Pneumonia, unspecified organism: Principal | ICD-10-CM | POA: Diagnosis present

## 2021-03-17 DIAGNOSIS — D696 Thrombocytopenia, unspecified: Secondary | ICD-10-CM | POA: Diagnosis present

## 2021-03-17 DIAGNOSIS — Z8249 Family history of ischemic heart disease and other diseases of the circulatory system: Secondary | ICD-10-CM

## 2021-03-17 DIAGNOSIS — I110 Hypertensive heart disease with heart failure: Secondary | ICD-10-CM | POA: Diagnosis present

## 2021-03-17 DIAGNOSIS — Z20822 Contact with and (suspected) exposure to covid-19: Secondary | ICD-10-CM | POA: Diagnosis present

## 2021-03-17 DIAGNOSIS — K7581 Nonalcoholic steatohepatitis (NASH): Secondary | ICD-10-CM | POA: Diagnosis present

## 2021-03-17 DIAGNOSIS — K7460 Unspecified cirrhosis of liver: Secondary | ICD-10-CM | POA: Diagnosis present

## 2021-03-17 DIAGNOSIS — Z7984 Long term (current) use of oral hypoglycemic drugs: Secondary | ICD-10-CM

## 2021-03-17 DIAGNOSIS — Z79899 Other long term (current) drug therapy: Secondary | ICD-10-CM

## 2021-03-17 DIAGNOSIS — Z9049 Acquired absence of other specified parts of digestive tract: Secondary | ICD-10-CM

## 2021-03-17 LAB — RESP PANEL BY RT-PCR (FLU A&B, COVID) ARPGX2
Influenza A by PCR: NEGATIVE
Influenza B by PCR: NEGATIVE
SARS Coronavirus 2 by RT PCR: NEGATIVE

## 2021-03-17 MED ORDER — SODIUM CHLORIDE 0.9 % IV SOLN
1.0000 g | Freq: Once | INTRAVENOUS | Status: AC
  Administered 2021-03-18: 1 g via INTRAVENOUS
  Filled 2021-03-17: qty 10

## 2021-03-17 MED ORDER — SODIUM CHLORIDE 0.9 % IV SOLN
500.0000 mg | Freq: Once | INTRAVENOUS | Status: AC
  Administered 2021-03-18: 500 mg via INTRAVENOUS
  Filled 2021-03-17: qty 500

## 2021-03-17 NOTE — ED Triage Notes (Signed)
Pt is from home and was on a cruise last week, she states that she started feeling short of breath in the last few days Pt has labored breathing and per EMS O2 sats were 88%, pt placed on 4 liters [place] and O2 sats are 97% Pt has cellulitis in both legs but no treatment for them Per EMS fever of 102.0

## 2021-03-17 NOTE — ED Provider Notes (Signed)
[facility] DEPT Provider Note   CSN: [id number] Arrival date & time: 03/17/21  2049     History Chief Complaint  Patient presents with   Shortness of Breath    Hannah Khan is a 65 y.o. female.   Shortness of Breath Associated symptoms: fever   Associated symptoms: no abdominal pain and no chest pain   Patient presents with shortness of breath feeling bad some fevers.  Fever 102 for EMS.  Also hypoxic with sats of 88%.  Patient states people are worried she could have a cellulitis on her legs.  Has chronic venous changes there.  Recently been on a cruise ship.  Also last month and had admission to the hospital for 3 weeks for hepatic encephalopathy.  States they had taken 40 pounds off of her but since then she has put the weight all back on.  States she has had some urinary frequency.  States she is attributed to her diuretics.  States she also has right flank pain now.  Patient states her blood pressure will often run low.  States they have sometimes problems with her blood pressure medicine because her blood pressure will be low.    Past Medical History:  Diagnosis Date   Anemia    hx of   Arthritis    knees   Diabetes mellitus without complication ([place])    type 2   Fibromyalgia    HTN (hypertension)    Hx of colonic polyps    s/p partial colectomy   Hyperlipidemia    Joint pain    in knees and back spasms   Left leg swelling    wear compression hose   Liver cirrhosis secondary to NASH (nonalcoholic steatohepatitis) ([place]) dx nov 2014   had enlarged spleen also    Low blood pressure    bp varies   Morbid obesity ([place])    Persistent atrial fibrillation ([place])    Pneumonia 10/2016   PONV (postoperative nausea and vomiting)    in past none recent   Portal hypertension ([place])    Sleep apnea    uses cpap setting of 14   Spleen enlarged    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 [facility])     Patient Active Problem List   Diagnosis Date  Noted   Atrial fibrillation with RVR ([place]) 01/10/2021   Acute encephalopathy 01/10/2021   Secondary hypercoagulable state ([place]) 06/23/2020   Streptococcal bacteremia 03/16/2020   Chronic venous stasis dermatitis of both lower extremities 03/16/2020   A-fib ([place]) 03/15/2020   Acute lower UTI 04/26/2018   Liver cirrhosis secondary to NASH (nonalcoholic steatohepatitis) ([place]) 04/26/2018   Hyperlipidemia 04/26/2018   Type 2 diabetes mellitus with other specified complication ([place])    Cellulitis of right lower extremity 03/15/2018   Severe sepsis ([place]) 03/14/2018   Acute respiratory failure with hypoxia ([place]) 11/01/2016   Community acquired pneumonia of right lower lobe of lung 10/31/2016   Muscular abdominal pain in right upper quadrant 07/11/2016   Acute on chronic diastolic CHF (congestive heart failure) ([place]) 07/10/2016   Thrombocytopenia ([place]) 07/10/2016   Permanent atrial fibrillation ([place]) 01/30/2016   Type 2 diabetes mellitus without complication, without long-term current use of insulin ([place]) 01/30/2016   Acute on chronic combined systolic and diastolic CHF (congestive heart failure) ([place]) 01/26/2016   Morbid obesity- 12/10/2015   Persistent atrial fibrillation ([place]) 12/08/2015   Melena 01/31/2015   Aortic heart murmur 01/31/2015   Pancytopenia ([place]) 07/23/2013   Obstructive sleep apnea  08/07/2007   Essential hypertension 08/07/2007   ALLERGIC RHINITIS 08/07/2007    Past Surgical History:  Procedure Laterality Date   BREAST LUMPECTOMY WITH RADIOACTIVE SEED LOCALIZATION Right 08/29/2017   Procedure: RIGHT BREAST LUMPECTOMY WITH RADIOACTIVE SEEDS LOCALIZATION;  Surgeon: Erroll Luna, MD;  Location: [facility] OR;  Service: General;  Laterality: Right;   [place]  20 yrs ago   COLECTOMY  2011   COLONOSCOPY     COLONOSCOPY N/A 09/05/2018   Procedure: COLONOSCOPY;  Surgeon: [name], MD;  Location: [facility];  Service: Endoscopy;   Laterality: N/A;   DILATATION & CURETTAGE/HYSTEROSCOPY WITH MYOSURE N/A 05/06/2016   Procedure: DILATATION & CURETTAGE/HYSTEROSCOPY WITH MYOSURE WITH POLYPECTOMY;  Surgeon: Janyth Pupa, DO;  Location: [facility];  Service: Gynecology;  Laterality: N/A;   ESOPHAGOGASTRODUODENOSCOPY (EGD) WITH PROPOFOL N/A 09/04/2013   Procedure: ESOPHAGOGASTRODUODENOSCOPY (EGD) WITH PROPOFOL;  Surgeon: [name], MD;  Location: [facility];  Service: Endoscopy;  Laterality: N/A;   KNEE ARTHROSCOPY  yrs ago   bilateral, one done x 1, one done twice   POLYPECTOMY  09/05/2018   Procedure: POLYPECTOMY;  Surgeon: [name], MD;  Location: [facility];  Service: Endoscopy;;     OB History   No obstetric history on file.     Family History  Problem Relation Age of Onset   Heart failure Mother    Cancer Mother    Hyperlipidemia Mother    Congestive Heart Failure Mother    Stroke Mother    Lung cancer Father    Cancer Father    Cancer Brother    Hyperlipidemia Brother    Stroke Other     Social History   Tobacco Use   Smoking status: Former    Packs/day: 1.00    Years: 3.00    Pack years: 3.00    Types: Cigarettes    Quit date: 09/19/1974    Years since quitting: 46.5   Smokeless tobacco: Never  Vaping Use   Vaping Use: Never used  Substance Use Topics   Alcohol use: Yes    Comment: occ   Drug use: No    Home Medications Prior to Admission medications   Medication Sig Start Date End Date Taking? Authorizing Provider  albuterol (VENTOLIN HFA) 108 (90 Base) MCG/ACT inhaler Inhale 2 puffs into the lungs every 6 (six) hours as needed for wheezing or shortness of breath. 01/24/21   Mesner, Corene Cornea, MD  bumetanide (BUMEX) 2 MG tablet Take 1 tablet (2 mg total) by mouth daily. 02/17/21   Deberah Pelton, NP  diltiazem (CARDIZEM CD) 360 MG 24 hr capsule Take 1 capsule (360 mg total) by mouth daily. 02/17/21   Deberah Pelton, NP  glimepiride (AMARYL) 1 MG tablet Take 1 tablet (1 mg total) by  mouth daily with lunch. 01/24/21 02/23/21  Mesner, Corene Cornea, MD  Lancets [facility] DELICA PLUS [id number]) MISC Apply topically 3 (three) times daily. 04/10/20   [provider]  lidocaine (LIDODERM) 5 % Place 1 patch onto the skin daily. Remove & Discard patch within 12 hours or as directed by MD 01/24/21   Mesner, Corene Cornea, MD  metoprolol tartrate (LOPRESSOR) 50 MG tablet Take 1 tablet (50 mg total) by mouth 2 (two) times daily. 02/17/21   Deberah Pelton, NP  ONETOUCH VERIO test strip 1 each 3 (three) times daily. 04/13/20   [provider]  potassium chloride (MICRO-K) 10 MEQ CR capsule TAKE 4 TABLETS BY MOUTH DAILY 01/25/21   Oval Linsey,  Tiffany, MD  spironolactone (ALDACTONE) 25 MG tablet Take 1 tablet (25 mg total) by mouth daily. 02/17/21   Deberah Pelton, NP    Allergies    Celecoxib, Shellfish allergy, Barium-containing compounds, Prednisone, Statins, Fentanyl, Ibuprofen, Tramadol, and Tylenol [acetaminophen]  Review of Systems   Review of Systems  Constitutional:  Positive for fever. Negative for appetite change.  HENT:  Negative for congestion.   Respiratory:  Positive for shortness of breath.   Cardiovascular:  Negative for chest pain.  Gastrointestinal:  Negative for abdominal pain.  Genitourinary:  Positive for flank pain.  Musculoskeletal:  Negative for back pain.  Skin:  Positive for wound.  Neurological:  Negative for weakness.  Psychiatric/Behavioral:  Negative for confusion.    Physical Exam Updated Vital Signs BP 94/70 (BP Location: Right Arm)   Pulse (!) 111   Temp 98.8 F (37.1 C) (Oral)   Resp (!) 28   SpO2 97% Comment: 4 liter [place]  Physical Exam Vitals and nursing note reviewed.  Constitutional:      Appearance: She is obese.  HENT:     Head: Atraumatic.  Eyes:     Pupils: Pupils are equal, round, and reactive to light.  Cardiovascular:     Rate and Rhythm: Tachycardia present. Rhythm irregular.  Pulmonary:     Comments: Mildly harsh breath  sounds. Chest:     Chest wall: No tenderness.  Abdominal:     Tenderness: There is no abdominal tenderness.  Musculoskeletal:     Cervical back: Neck supple.     Right lower leg: Edema present.     Left lower leg: Edema present.     Comments: Chronic venous changes of bilateral lower extremity  Skin:    General: Skin is warm.     Capillary Refill: Capillary refill takes less than 2 seconds.  Neurological:     Mental Status: She is alert.    ED Results / Procedures / Treatments   Labs (all labs ordered are listed, but only abnormal results are displayed) Labs Reviewed  RESP PANEL BY RT-PCR (FLU A&B, COVID) [id number]  CULTURE, BLOOD (ROUTINE X 2)  CULTURE, BLOOD (ROUTINE X 2)  URINE CULTURE  LACTIC ACID, PLASMA  LACTIC ACID, PLASMA  CBC WITH DIFFERENTIAL/PLATELET  COMPREHENSIVE METABOLIC PANEL  URINALYSIS, ROUTINE W REFLEX MICROSCOPIC  AMMONIA    EKG None  Radiology DG Chest Portable 1 View  Result Date: 03/17/2021 CLINICAL DATA:  Hypoxia short of breath EXAM: PORTABLE CHEST 1 VIEW COMPARISON:  01/16/2021, CT 01/10/2021 FINDINGS: Cardiomegaly with vascular congestion and edema. Suspect moderate bilateral pleural effusions, likely contributing to asymmetric hazy opacity on the right. Airspace disease at the bases. IMPRESSION: Cardiomegaly with vascular congestion, bilateral effusions and edema. Basilar consolidations may reflect atelectasis or pneumonia Electronically Signed   By: Donavan Foil M.D.   On: 03/17/2021 21:55    Procedures Procedures   Medications Ordered in ED Medications  cefTRIAXone (ROCEPHIN) 1 g in sodium chloride 0.9 % 100 mL IVPB (has no administration in time range)  azithromycin (ZITHROMAX) 500 mg in sodium chloride 0.9 % 250 mL IVPB (has no administration in time range)    ED Course  I have reviewed the triage vital signs and the nursing notes.  Pertinent labs & imaging results that were available during my care of the patient were reviewed by me  and considered in my medical decision making (see chart for details).    MDM Rules/Calculators/A&P  Patient presented with likely fever.  Mild tachycardia with A. fib.  History of same.  Some hypoxia.  History of [place] with volume overload.  X-ray shows vascular congestion and effusions and edema.  Also potentially some pneumonia.  Antibiotics given.  Also has had some flank pain.  Lab work still pending.  Blood pressure is mildly low but reportedly runs low would like this and with patient already being volume overloaded I want to be cautious with the fluids.  Also difficulty getting IV access.  Febrile for EMS but not here on oral.  Still pending rectal temperature.  Will require admission to the hospital due to the mild hypoxia.  Potentially will need diuresis also.  Care turned over to Dr. Sedonia Small  Final Clinical Impression(s) / ED Diagnoses Final diagnoses:  None    Rx / DC Orders ED Discharge Orders     None        Davonna Belling, MD 03/17/21 2329

## 2021-03-17 NOTE — ED Notes (Signed)
Phlebotomy called/paged to collect serum specimens.

## 2021-03-17 NOTE — ED Provider Notes (Signed)
  Provider Note MRN:  [id number]  Arrival date & time: 03/18/21    ED Course and Medical Decision Making  Assumed care from Dr. Alvino Chapel at shift change.  History of Hannah Khan cirrhosis here with shortness of breath, hypoxia, suspect pneumonia, providing IV antibiotics, she will need admission.  On my reassessment patient continues to require 4 L nasal cannula, she is now in A. fib with RVR.  Has a long history of A. fib, missed her evening dose of metoprolol we will provide now.  Otherwise she is in mild distress, tachypneic.  Admitted to hospitalist service for further care.  .Critical Care  Date/Time: 03/18/2021 2:28 AM Performed by: Maudie Flakes, MD Authorized by: Maudie Flakes, MD   Critical care provider statement:    Critical care time (minutes):  45   Critical care was necessary to treat or prevent imminent or life-threatening deterioration of the following conditions:  Respiratory failure (A. fib with RVR)   Critical care was time spent personally by me on the following activities:  Discussions with consultants, evaluation of patient's response to treatment, examination of patient, ordering and performing treatments and interventions, ordering and review of laboratory studies, ordering and review of radiographic studies, pulse oximetry, re-evaluation of patient's condition, obtaining history from patient or surrogate and review of old charts   I assumed direction of critical care for this patient from another provider in my specialty: yes     Care discussed with: admitting provider    Final Clinical Impressions(s) / ED Diagnoses     ICD-10-CM   1. Acute respiratory failure with hypoxia (HCC)  J96.01     2. Atrial fibrillation with RVR [facility])  I48.91       ED Discharge Orders     None       Discharge Instructions   None     Barth Kirks. Sedonia Small, [place] [email or web address]    Maudie Flakes, MD 03/18/21 [phone]

## 2021-03-17 NOTE — ED Notes (Signed)
Multiple staff have tried unsuccessfully to collect blood specimens.

## 2021-03-18 ENCOUNTER — Encounter (HOSPITAL_COMMUNITY): Payer: Self-pay | Admitting: Family Medicine

## 2021-03-18 ENCOUNTER — Other Ambulatory Visit: Payer: Self-pay

## 2021-03-18 ENCOUNTER — Other Ambulatory Visit (HOSPITAL_COMMUNITY): Payer: BC Managed Care – PPO

## 2021-03-18 DIAGNOSIS — K7581 Nonalcoholic steatohepatitis (NASH): Secondary | ICD-10-CM | POA: Diagnosis present

## 2021-03-18 DIAGNOSIS — Z66 Do not resuscitate: Secondary | ICD-10-CM | POA: Diagnosis present

## 2021-03-18 DIAGNOSIS — J189 Pneumonia, unspecified organism: Secondary | ICD-10-CM | POA: Diagnosis present

## 2021-03-18 DIAGNOSIS — Z87891 Personal history of nicotine dependence: Secondary | ICD-10-CM | POA: Diagnosis not present

## 2021-03-18 DIAGNOSIS — M797 Fibromyalgia: Secondary | ICD-10-CM | POA: Diagnosis present

## 2021-03-18 DIAGNOSIS — Z79899 Other long term (current) drug therapy: Secondary | ICD-10-CM | POA: Diagnosis not present

## 2021-03-18 DIAGNOSIS — E876 Hypokalemia: Secondary | ICD-10-CM | POA: Diagnosis present

## 2021-03-18 DIAGNOSIS — Z823 Family history of stroke: Secondary | ICD-10-CM | POA: Diagnosis not present

## 2021-03-18 DIAGNOSIS — Z8249 Family history of ischemic heart disease and other diseases of the circulatory system: Secondary | ICD-10-CM | POA: Diagnosis not present

## 2021-03-18 DIAGNOSIS — Z9049 Acquired absence of other specified parts of digestive tract: Secondary | ICD-10-CM | POA: Diagnosis not present

## 2021-03-18 DIAGNOSIS — Z91013 Allergy to seafood: Secondary | ICD-10-CM | POA: Diagnosis not present

## 2021-03-18 DIAGNOSIS — E785 Hyperlipidemia, unspecified: Secondary | ICD-10-CM | POA: Diagnosis present

## 2021-03-18 DIAGNOSIS — Z20822 Contact with and (suspected) exposure to covid-19: Secondary | ICD-10-CM | POA: Diagnosis present

## 2021-03-18 DIAGNOSIS — E119 Type 2 diabetes mellitus without complications: Secondary | ICD-10-CM | POA: Diagnosis present

## 2021-03-18 DIAGNOSIS — L03113 Cellulitis of right upper limb: Secondary | ICD-10-CM | POA: Diagnosis present

## 2021-03-18 DIAGNOSIS — Z7984 Long term (current) use of oral hypoglycemic drugs: Secondary | ICD-10-CM | POA: Diagnosis not present

## 2021-03-18 DIAGNOSIS — K7460 Unspecified cirrhosis of liver: Secondary | ICD-10-CM | POA: Diagnosis present

## 2021-03-18 DIAGNOSIS — G4733 Obstructive sleep apnea (adult) (pediatric): Secondary | ICD-10-CM | POA: Diagnosis present

## 2021-03-18 DIAGNOSIS — J9601 Acute respiratory failure with hypoxia: Secondary | ICD-10-CM | POA: Diagnosis present

## 2021-03-18 DIAGNOSIS — I110 Hypertensive heart disease with heart failure: Secondary | ICD-10-CM | POA: Diagnosis present

## 2021-03-18 DIAGNOSIS — I4819 Other persistent atrial fibrillation: Secondary | ICD-10-CM | POA: Diagnosis present

## 2021-03-18 DIAGNOSIS — Z801 Family history of malignant neoplasm of trachea, bronchus and lung: Secondary | ICD-10-CM | POA: Diagnosis not present

## 2021-03-18 DIAGNOSIS — I5043 Acute on chronic combined systolic (congestive) and diastolic (congestive) heart failure: Secondary | ICD-10-CM | POA: Diagnosis present

## 2021-03-18 LAB — GLUCOSE, CAPILLARY
Glucose-Capillary: 157 mg/dL — ABNORMAL HIGH (ref 70–99)
Glucose-Capillary: 131 mg/dL — ABNORMAL HIGH (ref 70–99)
Glucose-Capillary: 134 mg/dL — ABNORMAL HIGH (ref 70–99)
Glucose-Capillary: 150 mg/dL — ABNORMAL HIGH (ref 70–99)

## 2021-03-18 LAB — URINALYSIS, ROUTINE W REFLEX MICROSCOPIC
Bilirubin Urine: NEGATIVE
Glucose, UA: NEGATIVE mg/dL
Ketones, ur: NEGATIVE mg/dL
Nitrite: NEGATIVE
Protein, ur: 30 mg/dL — AB
Specific Gravity, Urine: 1.017 (ref 1.005–1.030)
pH: 5 (ref 5.0–8.0)

## 2021-03-18 LAB — COMPREHENSIVE METABOLIC PANEL
ALT: 18 U/L (ref 0–44)
AST: 25 U/L (ref 15–41)
Albumin: 3.4 g/dL — ABNORMAL LOW (ref 3.5–5.0)
Alkaline Phosphatase: 80 U/L (ref 38–126)
Anion gap: 8 (ref 5–15)
BUN: 18 mg/dL (ref 8–23)
CO2: 28 mmol/L (ref 22–32)
Calcium: 9 mg/dL (ref 8.9–10.3)
Chloride: 102 mmol/L (ref 98–111)
Creatinine, Ser: 0.97 mg/dL (ref 0.44–1.00)
GFR, Estimated: >60 mL/min (ref >60)
Glucose, Bld: 121 mg/dL — ABNORMAL HIGH (ref 70–99)
Potassium: 3.6 mmol/L (ref 3.5–5.1)
Sodium: 138 mmol/L (ref 135–145)
Total Bilirubin: 3.5 mg/dL — ABNORMAL HIGH (ref 0.3–1.2)
Total Protein: 7.4 g/dL (ref 6.5–8.1)

## 2021-03-18 LAB — TROPONIN I (HIGH SENSITIVITY)
Troponin I (High Sensitivity): 14 ng/L (ref <18)
Troponin I (High Sensitivity): 13 ng/L (ref <18)
Troponin I (High Sensitivity): 13 ng/L (ref <18)

## 2021-03-18 LAB — BASIC METABOLIC PANEL
Anion gap: 7 (ref 5–15)
BUN: 19 mg/dL (ref 8–23)
CO2: 28 mmol/L (ref 22–32)
Calcium: 8.8 mg/dL — ABNORMAL LOW (ref 8.9–10.3)
Chloride: 102 mmol/L (ref 98–111)
Creatinine, Ser: 0.91 mg/dL (ref 0.44–1.00)
GFR, Estimated: >60 mL/min (ref >60)
Glucose, Bld: 152 mg/dL — ABNORMAL HIGH (ref 70–99)
Potassium: 3.7 mmol/L (ref 3.5–5.1)
Sodium: 137 mmol/L (ref 135–145)

## 2021-03-18 LAB — CBC WITH DIFFERENTIAL/PLATELET
Abs Immature Granulocytes: 0.04 10*3/uL (ref 0.00–0.07)
Basophils Absolute: 0 10*3/uL (ref 0.0–0.1)
Basophils Relative: 0 %
Eosinophils Absolute: 0 10*3/uL (ref 0.0–0.5)
Eosinophils Relative: 0 %
HCT: 34.4 % — ABNORMAL LOW (ref 36.0–46.0)
Hemoglobin: 11.2 g/dL — ABNORMAL LOW (ref 12.0–15.0)
Immature Granulocytes: 1 %
Lymphocytes Relative: 4 %
Lymphs Abs: 0.3 10*3/uL — ABNORMAL LOW (ref 0.7–4.0)
MCH: 31.4 pg (ref 26.0–34.0)
MCHC: 32.6 g/dL (ref 30.0–36.0)
MCV: 96.4 fL (ref 80.0–100.0)
Monocytes Absolute: 0.5 10*3/uL (ref 0.1–1.0)
Monocytes Relative: 8 %
Neutro Abs: 6.1 10*3/uL (ref 1.7–7.7)
Neutrophils Relative %: 87 %
Platelets: 29 10*3/uL — CL (ref 150–400)
RBC: 3.57 MIL/uL — ABNORMAL LOW (ref 3.87–5.11)
RDW: 15.9 % — ABNORMAL HIGH (ref 11.5–15.5)
WBC: 7 10*3/uL (ref 4.0–10.5)
nRBC: 0 % (ref 0.0–0.2)

## 2021-03-18 LAB — CBC
HCT: 32 % — ABNORMAL LOW (ref 36.0–46.0)
Hemoglobin: 10.4 g/dL — ABNORMAL LOW (ref 12.0–15.0)
MCH: 31.4 pg (ref 26.0–34.0)
MCHC: 32.5 g/dL (ref 30.0–36.0)
MCV: 96.7 fL (ref 80.0–100.0)
Platelets: 29 10*3/uL — CL (ref 150–400)
RBC: 3.31 MIL/uL — ABNORMAL LOW (ref 3.87–5.11)
RDW: 16 % — ABNORMAL HIGH (ref 11.5–15.5)
WBC: 8.9 10*3/uL (ref 4.0–10.5)
nRBC: 0 % (ref 0.0–0.2)

## 2021-03-18 LAB — LACTIC ACID, PLASMA: Lactic Acid, Venous: 1.7 mmol/L (ref 0.5–1.9)

## 2021-03-18 LAB — HIV ANTIBODY (ROUTINE TESTING W REFLEX): HIV Screen 4th Generation wRfx: NONREACTIVE

## 2021-03-18 LAB — BRAIN NATRIURETIC PEPTIDE: B Natriuretic Peptide: 445.2 pg/mL — ABNORMAL HIGH (ref 0.0–100.0)

## 2021-03-18 LAB — AMMONIA: Ammonia: 28 umol/L (ref 9–35)

## 2021-03-18 MED ORDER — SODIUM CHLORIDE 0.9 % IV SOLN: 500.0000 mg | INTRAVENOUS | Status: DC

## 2021-03-18 MED ORDER — SODIUM CHLORIDE 0.9 % IV SOLN
500.0000 mg | INTRAVENOUS | Status: AC
  Administered 2021-03-18 – 2021-03-21 (×4): 500 mg via INTRAVENOUS
  Filled 2021-03-18 (×4): qty 500

## 2021-03-18 MED ORDER — FUROSEMIDE 10 MG/ML IJ SOLN
40.0000 mg | Freq: Two times a day (BID) | INTRAMUSCULAR | Status: DC
  Administered 2021-03-18: 40 mg via INTRAVENOUS
  Filled 2021-03-18: qty 4

## 2021-03-18 MED ORDER — BUMETANIDE 1 MG PO TABS
2.0000 mg | ORAL_TABLET | Freq: Every day | ORAL | Status: DC
  Filled 2021-03-18: qty 2

## 2021-03-18 MED ORDER — SODIUM CHLORIDE 0.9 % IV SOLN: 2.0000 g | INTRAVENOUS | Status: DC

## 2021-03-18 MED ORDER — DILTIAZEM HCL 25 MG/5ML IV SOLN
10.0000 mg | Freq: Once | INTRAVENOUS | Status: AC
  Administered 2021-03-18: 10 mg via INTRAVENOUS
  Filled 2021-03-18: qty 5

## 2021-03-18 MED ORDER — IPRATROPIUM BROMIDE 0.02 % IN SOLN
0.5000 mg | Freq: Four times a day (QID) | RESPIRATORY_TRACT | Status: DC
  Administered 2021-03-18 – 2021-03-21 (×14): 0.5 mg via RESPIRATORY_TRACT
  Filled 2021-03-18 (×15): qty 2.5

## 2021-03-18 MED ORDER — INSULIN ASPART 100 UNIT/ML IJ SOLN
0.0000 [IU] | Freq: Every day | INTRAMUSCULAR | Status: DC
  Filled 2021-03-18: qty 0.05

## 2021-03-18 MED ORDER — ALBUTEROL SULFATE (2.5 MG/3ML) 0.083% IN NEBU
2.5000 mg | INHALATION_SOLUTION | Freq: Once | RESPIRATORY_TRACT | Status: AC
  Administered 2021-03-18: 2.5 mg via RESPIRATORY_TRACT
  Filled 2021-03-18: qty 3

## 2021-03-18 MED ORDER — FUROSEMIDE 10 MG/ML IJ SOLN
40.0000 mg | Freq: Two times a day (BID) | INTRAMUSCULAR | Status: DC
  Administered 2021-03-18 – 2021-03-22 (×8): 40 mg via INTRAVENOUS
  Filled 2021-03-18 (×8): qty 4

## 2021-03-18 MED ORDER — SODIUM CHLORIDE 0.9 % IV SOLN
2.0000 g | INTRAVENOUS | Status: AC
  Administered 2021-03-18 – 2021-03-21 (×4): 2 g via INTRAVENOUS
  Filled 2021-03-18: qty 2
  Filled 2021-03-18 (×2): qty 20
  Filled 2021-03-18: qty 2

## 2021-03-18 MED ORDER — INSULIN ASPART 100 UNIT/ML IJ SOLN
0.0000 [IU] | Freq: Three times a day (TID) | INTRAMUSCULAR | Status: DC
  Administered 2021-03-18: 3 [IU] via SUBCUTANEOUS
  Administered 2021-03-18 – 2021-03-20 (×3): 2 [IU] via SUBCUTANEOUS
  Administered 2021-03-20: 3 [IU] via SUBCUTANEOUS
  Administered 2021-03-21 – 2021-03-22 (×5): 2 [IU] via SUBCUTANEOUS
  Filled 2021-03-18: qty 0.15

## 2021-03-18 MED ORDER — LEVALBUTEROL HCL 0.63 MG/3ML IN NEBU
0.6300 mg | INHALATION_SOLUTION | Freq: Four times a day (QID) | RESPIRATORY_TRACT | Status: DC
  Administered 2021-03-18 – 2021-03-21 (×13): 0.63 mg via RESPIRATORY_TRACT
  Filled 2021-03-18 (×14): qty 3

## 2021-03-18 MED ORDER — DILTIAZEM HCL ER COATED BEADS 240 MG PO CP24
360.0000 mg | ORAL_CAPSULE | Freq: Every day | ORAL | Status: DC
  Administered 2021-03-18 – 2021-03-22 (×5): 360 mg via ORAL
  Filled 2021-03-18 (×5): qty 1

## 2021-03-18 MED ORDER — SPIRONOLACTONE 25 MG PO TABS
25.0000 mg | ORAL_TABLET | Freq: Every day | ORAL | Status: DC
  Administered 2021-03-18 – 2021-03-22 (×5): 25 mg via ORAL
  Filled 2021-03-18 (×5): qty 1

## 2021-03-18 MED ORDER — METOPROLOL TARTRATE 50 MG PO TABS
50.0000 mg | ORAL_TABLET | Freq: Two times a day (BID) | ORAL | Status: DC
  Administered 2021-03-18 – 2021-03-22 (×10): 50 mg via ORAL
  Filled 2021-03-18: qty 1
  Filled 2021-03-18: qty 2
  Filled 2021-03-18 (×8): qty 1

## 2021-03-18 MED ORDER — LACTULOSE 10 GM/15ML PO SOLN
20.0000 g | Freq: Three times a day (TID) | ORAL | Status: DC
Start: 2021-03-18 — End: 2021-03-22
  Administered 2021-03-18 – 2021-03-22 (×14): 20 g via ORAL
  Filled 2021-03-18 (×15): qty 30

## 2021-03-18 MED ORDER — RIFAXIMIN 550 MG PO TABS
550.0000 mg | ORAL_TABLET | Freq: Two times a day (BID) | ORAL | Status: DC
  Administered 2021-03-18 – 2021-03-22 (×9): 550 mg via ORAL
  Filled 2021-03-18 (×9): qty 1

## 2021-03-18 NOTE — Progress Notes (Signed)
  PROGRESS NOTE  Patient admitted earlier this morning. See H&P.   Hannah Khan is a 65 y.o. female with medical history significant for DMT2, Chronic A-fib, thrombocytopenia, HTN, HFpEF, fibromyalgia, OA, OSA on CPAP, NASH with cirrohosis.  She presents by EMS for evaluation of SOB, hypoxia and fever. She states she has not felt good the past 2 to 3 days with increasing shortness of breath and cough.  She developed chills and fever.  Reportedly she had a temperature of 102 degrees with EMS.  At home she was very short of breath and when she checked her oxygen saturation it was in the mid 10s.  As she sat down to rest and try to calm her breathing oxygen saturation improved to the mid 80s but never got back to her normal 95 to 96% she states.  EMS placed her on oxygen and transported to the hospital.  In the emergency department, patient was diagnosed with pulmonary edema as well as community-acquired pneumonia.  She developed A. fib RVR and was given IV Cardizem.  She was started on empiric antibiotics and IV Lasix.  On examination this morning, patient is on 4 L of nasal cannula O2.  She remains short of breath with conversation although never in distress.  She admits that she always has level of peripheral edema, wears compression stockings at home.  She did not feel that her edema had worsened from her baseline.  No significant cough at home.  A/P:  Acute hypoxemic respiratory failure -Not on oxygen at baseline -Remains on 4 L nasal cannula O2, wean as able  Community-acquired pneumonia -Continue Rocephin, azithromycin  Pulmonary edema -BNP 445, does have some peripheral edema as well -Check echocardiogram -Continue IV Lasix, spironolactone, strict I's and O's, daily weight, fluid restriction diet  Persistent atrial fibrillation -Continue home metoprolol, Cardizem  Diabetes mellitus type 2 -Hemoglobin A1c pending -Continue sliding scale insulin  Chronic thrombocytopenia -Off  anticoagulation -Monitor CBC  Cirrhosis secondary to Karlene Lineman -Continue home lactulose, Xifaxan  OSA -CPAP nightly    Status is: Inpatient  Remains inpatient appropriate because:Hemodynamically unstable, IV treatments appropriate due to intensity of illness or inability to take PO, and Inpatient level of care appropriate due to severity of illness  Dispo: The patient is from: Home              Anticipated d/c is to: Home              Patient currently is not medically stable to d/c.   Difficult to place patient No       Dessa Phi, DO [facility] 03/18/2021, 11:24 AM  Available via Epic secure chat 7am-7pm After these hours, please refer to coverage provider listed on [email or web address]

## 2021-03-18 NOTE — ED Notes (Signed)
Pt. Documented in error see above note in chart.

## 2021-03-18 NOTE — Plan of Care (Signed)

## 2021-03-18 NOTE — H&P (Addendum)
History and Physical    Hannah Khan VXB:[id number] DOB: 08/15/1956 DOA: 03/17/2021  PCP: Lujean Amel, MD   Patient coming from: Home  Chief Complaint: SOB, cough  HPI: Hannah Khan is a 65 y.o. female with medical history significant for DMT2, Chronic A-fib, thrombocytopenia, HTN, HFpEF, fibromyalgia, OA, OSA on CPAP, NASH with cirrohosis.  She presents by EMS for evaluation of SOB, hypoxia and fever. She states she has not felt good the past 2 to 3 days with increasing shortness of breath and cough.  She developed chills and fever.  Reportedly she had a temperature of 102 degrees with EMS.  At home she was very short of breath and when she checked her oxygen saturation it was in the mid 20s.  As she sat down to rest and try to calm her breathing oxygen saturation improved to the mid 80s but never got back to her normal 95 to 96% she states.  EMS placed her on oxygen and transported to the hospital.  He recently took a cruise to [place] with her husband and got back a few days ago.  She was admitted last month for hepatic encephalopathy.  She states has been compliant with all of her home medications.  She does have chronic atrial fibrillation but is not anticoagulated secondary to chronic thrombocytopenia with platelet count of 13,000 26,000 normally.  She does have easy bruising but she has not had any recent bleeding.  She reports she has had urinary frequency and occasional incontinence when she changes positions when lying down.  She has attributed this to taking her diuretic.  She denies any dysuria.  She has been fully vaccinated for COVID.  Her husband has not been sick recently.  She does not member being around anyone that was obviously sick while on the cruise.  She has not had any nausea vomiting or diarrhea.  She reports that she had lost 35 to 40 pounds after her admission but she has since gained the weight back.  ED Course: Hannah Khan has required supplemental oxygen and is now  on high flow nasal cannula in the emergency room maintaining O2 sats.  Chest x-ray shows cardiomegaly with pulmonary edema and bilateral infiltrates which could be pneumonia versus atelectasis.  Fever she was started on antibiotic therapy with Rocephin and azithromycin.  She developed RVR with her atrial fibrillation and was given her dose of metoprolol in the emergency room.  She continues to have tachycardia and was also given a dose of IV Cardizem.  Lab work shows a WBC of 7000, hemoglobin 11.2, hematocrit 34.4, platelets 29,000, sodium 138, potassium 3.6, chloride 102, bicarb 28, glucose 121, creatinine 0.97, BUN 18, calcium 9.0.  LFTs normal.  Lactic acid 1.7.  Ammonia level 28.  Glucose level 121.  Review of Systems:  General: Reports fatigue and weakness. Reports fever, chills. Denies weight loss, night sweats.  Denies dizziness. Reports decreased appetite.  HENT: Denies head trauma, headache, denies change in hearing, tinnitus.  Denies nasal congestion or bleeding.  Denies sore throat, sores in mouth.  Denies difficulty swallowing Eyes: Denies blurry vision, pain in eye, drainage.  Denies discoloration of eyes. Neck: Denies pain.  Denies swelling.  Denies pain with movement. Cardiovascular: Denies chest pain, palpitations. Reports chronic orthopnea Respiratory: Reports shortness of breath with cough.  Denies wheezing. Reports chronic white sputum production Gastrointestinal: Denies abdominal pain, swelling.  Denies nausea, vomiting, diarrhea. Denies melena. Denies hematemesis. Musculoskeletal: Denies limitation of movement. Denies deformity or swelling. Denies  arthralgias or myalgias. Genitourinary: Denies pelvic pain.  Denies urinary frequency or hesitancy.  Denies dysuria.  Skin: Denies rash.  Denies petechiae, purpura, ecchymosis. Neurological: Denies syncope. Denies seizure activity. Denies paresthesia. Denies slurred speech, drooping face. Denies visual change. Psychiatric: Denies  depression, anxiety. Denies hallucinations.  Past Medical History:  Diagnosis Date   Anemia    hx of   Arthritis    knees   Diabetes mellitus without complication (HCC)    type 2   Fibromyalgia    HTN (hypertension)    Hx of colonic polyps    s/p partial colectomy   Hyperlipidemia    Joint pain    in knees and back spasms   Left leg swelling    wear compression hose   Liver cirrhosis secondary to NASH (nonalcoholic steatohepatitis) ([place]) dx nov 2014   had enlarged spleen also    Low blood pressure    bp varies   Morbid obesity ([place])    Persistent atrial fibrillation ([place])    Pneumonia 10/2016   PONV (postoperative nausea and vomiting)    in past none recent   Portal hypertension ([place])    Sleep apnea    uses cpap setting of 14   Spleen enlarged    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 [facility])     Past Surgical History:  Procedure Laterality Date   BREAST LUMPECTOMY WITH RADIOACTIVE SEED LOCALIZATION Right 08/29/2017   Procedure: RIGHT BREAST LUMPECTOMY WITH RADIOACTIVE SEEDS LOCALIZATION;  Surgeon: Erroll Luna, MD;  Location: [facility];  Service: General;  Laterality: Right;   [place]  20 yrs ago   COLECTOMY  2011   COLONOSCOPY     COLONOSCOPY N/A 09/05/2018   Procedure: COLONOSCOPY;  Surgeon: [name], MD;  Location: [facility];  Service: Endoscopy;  Laterality: N/A;   DILATATION & CURETTAGE/HYSTEROSCOPY WITH MYOSURE N/A 05/06/2016   Procedure: DILATATION & CURETTAGE/HYSTEROSCOPY WITH MYOSURE WITH POLYPECTOMY;  Surgeon: Janyth Pupa, DO;  Location: [facility];  Service: Gynecology;  Laterality: N/A;   ESOPHAGOGASTRODUODENOSCOPY (EGD) WITH PROPOFOL N/A 09/04/2013   Procedure: ESOPHAGOGASTRODUODENOSCOPY (EGD) WITH PROPOFOL;  Surgeon: [name], MD;  Location: [facility];  Service: Endoscopy;  Laterality: N/A;   KNEE ARTHROSCOPY  yrs ago   bilateral, one done x 1, one done twice   POLYPECTOMY  09/05/2018   Procedure:  POLYPECTOMY;  Surgeon: [name], MD;  Location: [facility];  Service: Endoscopy;;    Social History  reports that she quit smoking about 46 years ago. Her smoking use included cigarettes. She has a 3.00 pack-year smoking history. She has never used smokeless tobacco. She reports current alcohol use. She reports that she does not use drugs.  Allergies  Allergen Reactions   Celecoxib Other (See Comments)    "speech slurred" with celebrex   Shellfish Allergy Swelling and Rash    TINGLING AND SWELLING OF LIPS. LARGE WHELPS QUARTER-SIZE   Barium-Containing Compounds Other (See Comments)    TACHYCARDIA   Prednisone Other (See Comments)    TACHYCARDIA   Statins Other (See Comments)    AGGRAVATED FIBROMYALGIA   Fentanyl     Hallucinations    Ibuprofen Other (See Comments)    UNSPECIFIED SPECIFIC REACTION >> "DUE TO PLATELETS"   Tramadol     Hallucination    Tylenol [Acetaminophen] Other (See Comments)    UNSPECIFIED SPECIFIC REACTION >> "DUE TO PLATELETS"    Family History  Problem Relation Age of Onset   Heart failure Mother  Cancer Mother    Hyperlipidemia Mother    Congestive Heart Failure Mother    Stroke Mother    Lung cancer Father    Cancer Father    Cancer Brother    Hyperlipidemia Brother    Stroke Other      Prior to Admission medications   Medication Sig Start Date End Date Taking? Authorizing Provider  albuterol (VENTOLIN HFA) 108 (90 Base) MCG/ACT inhaler Inhale 2 puffs into the lungs every 6 (six) hours as needed for wheezing or shortness of breath. 01/24/21   Mesner, Corene Cornea, MD  bumetanide (BUMEX) 2 MG tablet Take 1 tablet (2 mg total) by mouth daily. 02/17/21   Deberah Pelton, NP  diltiazem (CARDIZEM CD) 360 MG 24 hr capsule Take 1 capsule (360 mg total) by mouth daily. 02/17/21   Deberah Pelton, NP  glimepiride (AMARYL) 1 MG tablet Take 1 tablet (1 mg total) by mouth daily with lunch. 01/24/21 02/23/21  Mesner, Corene Cornea, MD  Lancets [facility] DELICA PLUS  [id number]) MISC Apply topically 3 (three) times daily. 04/10/20   [provider]  lidocaine (LIDODERM) 5 % Place 1 patch onto the skin daily. Remove & Discard patch within 12 hours or as directed by MD 01/24/21   Mesner, Corene Cornea, MD  metoprolol tartrate (LOPRESSOR) 50 MG tablet Take 1 tablet (50 mg total) by mouth 2 (two) times daily. 02/17/21   Deberah Pelton, NP  ONETOUCH VERIO test strip 1 each 3 (three) times daily. 04/13/20   [provider]  potassium chloride (MICRO-K) 10 MEQ CR capsule TAKE 4 TABLETS BY MOUTH DAILY 01/25/21   Skeet Latch, MD  spironolactone (ALDACTONE) 25 MG tablet Take 1 tablet (25 mg total) by mouth daily. 02/17/21   Deberah Pelton, NP    Physical Exam: Vitals:   03/17/21 2230 03/17/21 2339 03/18/21 0115 03/18/21 0152  BP: 107/61 102/63 (!) 101/53 (!) 116/53  Pulse: (!) 113 (!) 121 (!) 120   Resp: (!) 22 (!) 23 (!) 22   Temp:      TempSrc:      SpO2: 99% 100% 95%     Constitutional: NAD, calm, comfortable Vitals:   03/17/21 2230 03/17/21 2339 03/18/21 0115 03/18/21 0152  BP: 107/61 102/63 (!) 101/53 (!) 116/53  Pulse: (!) 113 (!) 121 (!) 120   Resp: (!) 22 (!) 23 (!) 22   Temp:      TempSrc:      SpO2: 99% 100% 95%    General: WDWN, Alert and oriented x3.  Eyes: EOMI, PERRL, conjunctivae normal. Sclera nonicteric HENT:  /AT, external ears normal.  Nares patent without epistasis.  Mucous membranes are moist. Neck: Soft, normal range of motion, supple, no masses, no thyromegaly. Trachea midline Respiratory: Diminished breath sounds bilaterally. Diffuse rales. no wheezing, no crackles. Normal respiratory effort. No accessory muscle use.  Cardiovascular: Irregularly irregular rhythm.  Tachycardia.  Distant heart sounds., no murmurs / rubs / gallops.  Mild chronic lower extremity edema. 1+ pedal pulses.  Abdomen: Soft, no tenderness, nondistended, no rebound or guarding. No masses palpated. Morbidly obese. Bowel sounds  normoactive Musculoskeletal: FROM. no cyanosis. No joint deformity upper and lower extremities. Normal muscle tone.  Skin: Warm, dry, intact no rashes, lesions, ulcers. No induration.  Venous stasis with chronic skin changes of lower legs. Neurologic: CN 2-12 grossly intact.  Normal speech.  Sensation intact to touch. Strength 5/5 in all extremities.   Psychiatric: Normal judgment and insight.  Normal mood.    Labs  on Admission: I have personally reviewed following labs and imaging studies  CBC: Recent Labs  Lab 03/18/21 0011  WBC 7.0  NEUTROABS 6.1  HGB 11.2*  HCT 34.4*  MCV 96.4  PLT 29*    Basic Metabolic Panel: Recent Labs  Lab 03/18/21 0011  NA 138  K 3.6  CL 102  CO2 28  GLUCOSE 121*  BUN 18  CREATININE 0.97  CALCIUM 9.0    GFR: CrCl cannot be calculated (Unknown ideal weight.).  Liver Function Tests: Recent Labs  Lab 03/18/21 0011  AST 25  ALT 18  ALKPHOS 80  BILITOT 3.5*  PROT 7.4  ALBUMIN 3.4*    Urine analysis:    Component Value Date/Time   COLORURINE AMBER (A) 01/10/2021 0218   APPEARANCEUR CLOUDY (A) 01/10/2021 0218   LABSPEC 1.025 01/10/2021 0218   PHURINE 5.0 01/10/2021 0218   GLUCOSEU >=500 (A) 01/10/2021 0218   HGBUR MODERATE (A) 01/10/2021 0218   BILIRUBINUR NEGATIVE 01/10/2021 [place] 01/10/2021 0218   PROTEINUR 30 (A) 01/10/2021 0218   UROBILINOGEN 2.0 (H) 01/31/2015 0055   NITRITE NEGATIVE 01/10/2021 0218   LEUKOCYTESUR MODERATE (A) 01/10/2021 0218    Radiological Exams on Admission: DG Chest Portable 1 View  Result Date: 03/17/2021 CLINICAL DATA:  Hypoxia short of breath EXAM: PORTABLE CHEST 1 VIEW COMPARISON:  01/16/2021, CT 01/10/2021 FINDINGS: Cardiomegaly with vascular congestion and edema. Suspect moderate bilateral pleural effusions, likely contributing to asymmetric hazy opacity on the right. Airspace disease at the bases. IMPRESSION: Cardiomegaly with vascular congestion, bilateral effusions and  edema. Basilar consolidations may reflect atelectasis or pneumonia Electronically Signed   By: Donavan Foil M.D.   On: 03/17/2021 21:55    EKG: Independently reviewed.  EKG shows atrial fibrillation with left anterior fascicular block.  No acute ST elevation or depression.  QTc 436  Assessment/Plan Principal Problem:   CAP (community acquired pneumonia) Hannah Khan is admitted to telemetry floor. She is started on Rocephin and azithromycin for antibiotic coverage of bilateral pneumonia on chest x-ray. She has hypoxia and is requiring oxygen.  She is on high flow nasal cannula at this time.  She does not use oxygen at home.  Albuterol as needed for shortness of breath.  We will wean oxygen tolerated  Active Problems:   Acute respiratory failure with hypoxia  Secondary to pneumonia.  On oxygen.  RT to follow.  Deep cough with incentive spirometer every 2 hours for pulmonary toilet. Will give lasix with hx of CHF. Check echocardiogarm.    Essential hypertension Continue home medication of metoprolol and Cardizem.  Monitor blood pressure    Persistent atrial fibrillation  Home medication of metoprolol and Cardizem.  Patient be given Cardizem 10 mg IV now with RVR with her A. fib.  She was given p.o. metoprolol by ER physician already. Pt is not on anticoagulation and understands stroke risk without anticoagulation with a-fib.  Check troponin and BNP levels     Type 2 diabetes mellitus without complication, without long-term current use of insulin  Check Hemoglobin A1c.  Hold sulfonylurea therapy while hospitalized. Monitor blood sugars with meals and at bedtime.  Sliding scale insulin provided as needed for glycemic control.    Thrombocytopenia  Chronic. Monitor CBC. No anticoagulation.     Obstructive sleep apnea Use CPAP at night/sleeping    Liver cirrhosis secondary to NASH (nonalcoholic steatohepatitis) Chronic, Stable. Ammonia level normal.      DVT prophylaxis: SCDs for  DVT prophylaxis. Pt is  not anticoagulated due to chronic thrombocytopenia  Code Status:   Full Code  Family Communication:  Diagnosis and plan discussed with patient and her husband who is at bedside.  They both verbalized understanding agree with plan.  Further recommendations to follow as clinically indicated Disposition Plan:   Patient is from:  Home  Anticipated DC to:  Home  Anticipated DC date:  Anticipate 2 midnight or more stay  Anticipated DC barriers: No barriers to discharge identified at this time  Admission status:  Inpatient   Yevonne Aline Tariyah Pendry MD [facility]  How to contact the [facility] Attending or Consulting provider [place] or covering provider during after hours [place], for this patient?   Check the care team in [facility] and look for a) attending/consulting [facility] provider listed and b) the [facility] team listed Log into [email or web address] and use 's universal password to access. If you do not have the password, please contact the hospital operator. Locate the [facility] provider you are looking for under [facility] and page to a number that you can be directly reached. If you still have difficulty reaching the provider, please page the [facility] (Director on Call) for the Hospitalists listed on amion for assistance.  03/18/2021, 2:38 AM

## 2021-03-18 NOTE — Progress Notes (Addendum)
Patient came from ED around 0535, short of breath, on 4L, 90s O2 sat. On call provider notified as patient's breathing not improving. Patient still having labored and difficulty of breathing, worse when turned to sides, RT also made aware of CPAP order.

## 2021-03-18 NOTE — ED Notes (Signed)
ED TO INPATIENT HANDOFF REPORT  Name/Age/Gender Hannah Khan 64 y.o. female  Code Status    Code Status Orders  (From admission, onward)         Start     Ordered   03/18/21 0348  Full code  Continuous        03/18/21 0347        Code Status History    Date Active Date Inactive Code Status Order ID Comments User Context   01/19/2021 1016 01/22/2021 2035 DNR [id number]  Pershing Proud, NP Inpatient   01/10/2021 0401 01/19/2021 1016 Full Code [id number]  Vianne Bulls, MD ED   03/15/2020 0607 03/18/2020 2139 Full Code [id number]  Clance Boll, MD ED   09/07/2018 2347 09/08/2018 1722 Full Code [id number]  Elmarie Shiley, MD Inpatient   04/26/2018 1502 04/29/2018 1740 Full Code [id number]  Reubin Milan, MD ED   03/15/2018 0325 03/23/2018 1548 Full Code [id number]  Etta Quill, DO Inpatient   03/15/2018 0245 03/15/2018 0325 Full Code [id number]  Reyne Dumas, MD Inpatient   10/31/2016 2201 11/02/2016 1849 Full Code [id number]  Edwin Dada, MD Inpatient   07/10/2016 1702 07/11/2016 2001 Full Code [id number]  Debbe Odea, MD ED   01/26/2016 1720 01/30/2016 1756 Full Code [id number]  Florinda Marker, MD ED   12/08/2015 2120 12/12/2015 1651 Full Code [id number]  Elgergawy, Silver Huguenin, MD Inpatient   01/31/2015 0243 02/01/2015 1559 Full Code [id number]  Lavina Hamman, MD ED   07/23/2013 1928 07/24/2013 1704 Full Code [id number]  Modena Jansky, MD ED      Home/SNF/Other Home  Chief Complaint CAP (community acquired pneumonia) [J18.9]  Level of Care/Admitting Diagnosis ED Disposition    ED Disposition  Admit   Condition  --   [facility]: [facility] [[id number]]  Level of Care: Telemetry [5]  Admit to tele based on following criteria: Complex arrhythmia (Bradycardia/Tachycardia)  May admit patient to [facility] or [facility] if equivalent level of care is available:: Yes  Covid Evaluation: Confirmed COVID Negative  Diagnosis: CAP  (community acquired pneumonia) [193790]  Admitting Physician: Eben Burow [[id number]]  Attending Physician: Eben Burow [[id number]]  Estimated length of stay: past midnight tomorrow  Certification:: I certify this patient will need inpatient services for at least 2 midnights         Medical History Past Medical History:  Diagnosis Date  . Anemia    hx of  . Arthritis    knees  . Diabetes mellitus without complication ([place])    type 2  . Fibromyalgia   . HTN (hypertension)   . Hx of colonic polyps    s/p partial colectomy  . Hyperlipidemia   . Joint pain    in knees and back spasms  . Left leg swelling    wear compression hose  . Liver cirrhosis secondary to NASH (nonalcoholic steatohepatitis) ([place]) dx nov 2014   had enlarged spleen also   . Low blood pressure    bp varies  . Morbid obesity ([place])   . Persistent atrial fibrillation ([place])   . Pneumonia 10/2016  . PONV (postoperative nausea and vomiting)    in past none recent  . Portal hypertension ([place])   . Sleep apnea    uses cpap setting of 14  . Spleen enlarged   . 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 Celecoxib  Other (See Comments)    "speech slurred" with celebrex  . Shellfish Allergy Swelling and Rash    TINGLING AND SWELLING OF LIPS. LARGE WHELPS QUARTER-SIZE  . Barium-Containing Compounds Other (See Comments)    TACHYCARDIA  . Prednisone Other (See Comments)    TACHYCARDIA  . Statins Other (See Comments)    AGGRAVATED FIBROMYALGIA  . Fentanyl     Hallucinations   . Ibuprofen Other (See Comments)    UNSPECIFIED SPECIFIC REACTION >> "DUE TO PLATELETS"  . Tramadol     Hallucination   . Tylenol [Acetaminophen] Other (See Comments)    UNSPECIFIED SPECIFIC REACTION >> "DUE TO PLATELETS"    IV Location/Drains/Wounds Patient Lines/Drains/Airways Status    Active Line/Drains/Airways    Name Placement date Placement time Site Days    Peripheral IV 03/18/21 20 G 2.5" Anterior;Left Forearm 03/18/21  0123  Forearm  less than 1   Incision (Closed) 01/11/21 Shoulder Right 01/11/21  0736  -- 66          Labs/Imaging Results for orders placed or performed during the hospital encounter of 03/17/21 (from the past 48 hour(s))  Resp Panel by RT-PCR (Flu A&B, Covid) Nasopharyngeal Swab     Status: None   Collection Time: 03/17/21  9:32 PM   Specimen: Nasopharyngeal Swab; Nasopharyngeal(NP) swabs in vial transport medium  Result Value Ref Range   SARS Coronavirus 2 by RT PCR NEGATIVE NEGATIVE    Comment: (NOTE) SARS-CoV-2 target nucleic acids are NOT DETECTED.  The SARS-CoV-2 RNA is generally detectable in upper respiratory specimens during the acute phase of infection. The lowest concentration of SARS-CoV-2 viral copies this assay can detect is 138 copies/mL. A negative result does not preclude SARS-Cov-2 infection and should not be used as the sole basis for treatment or other patient management decisions. A negative result may occur with  improper specimen collection/handling, submission of specimen other than nasopharyngeal swab, presence of viral mutation(s) within the areas targeted by this assay, and inadequate number of viral copies(<138 copies/mL). A negative result must be combined with clinical observations, patient history, and epidemiological information. The expected result is Negative.  Fact Sheet for Patients:  EntrepreneurPulse.com.au  Fact Sheet for Healthcare Providers:  IncredibleEmployment.be  This test is no t yet approved or cleared by the [place] FDA and  has been authorized for detection and/or diagnosis of SARS-CoV-2 by FDA under an Emergency Use Authorization (EUA). This EUA will remain  in effect (meaning this test can be used) for the duration of the COVID-19 declaration under Section 564(b)(1) of the Act, 21 U.S.C.section 360bbb-3(b)(1), unless the  authorization is terminated  or revoked sooner.       Influenza A by PCR NEGATIVE NEGATIVE   Influenza B by PCR NEGATIVE NEGATIVE    Comment: (NOTE) The Xpert Xpress SARS-CoV-2/FLU/RSV plus assay is intended as an aid in the diagnosis of influenza from Nasopharyngeal swab specimens and should not be used as a sole basis for treatment. Nasal washings and aspirates are unacceptable for Xpert Xpress SARS-CoV-2/FLU/RSV testing.  Fact Sheet for Patients: EntrepreneurPulse.com.au  Fact Sheet for Healthcare Providers: IncredibleEmployment.be  This test is not yet approved or cleared by the [place] FDA and has been authorized for detection and/or diagnosis of SARS-CoV-2 by FDA under an Emergency Use Authorization (EUA). This EUA will remain in effect (meaning this test can be used) for the duration of the COVID-19 declaration under Section 564(b)(1) of the Act, 21 U.S.C. section 360bbb-3(b)(1), unless the authorization is terminated or  revoked.  Performed at [facility], [place] [address] [place], [place] [address]   Lactic acid, plasma     Status: None   Collection Time: 03/17/21 11:30 PM  Result Value Ref Range   Lactic Acid, Venous 1.7 0.5 - 1.9 mmol/L    Comment: Performed at [facility], [place] [address] [place], [place] [address]  Ammonia     Status: None   Collection Time: 03/17/21 11:30 PM  Result Value Ref Range   Ammonia 28 9 - 35 umol/L    Comment: Performed at [facility], [place] [address], [place], [place] [address]  CBC with Differential     Status: Abnormal   Collection Time: 03/18/21 12:11 AM  Result Value Ref Range   WBC 7.0 4.0 - 10.5 K/uL   RBC 3.57 (L) 3.87 - 5.11 MIL/uL   Hemoglobin 11.2 (L) 12.0 - 15.0 g/dL   HCT 34.4 (L) 36.0 - 46.0 %   MCV 96.4 80.0 - 100.0 fL   MCH 31.4 26.0 - 34.0 pg   MCHC 32.6 30.0 - 36.0 g/dL   RDW 15.9 (H) 11.5 - 15.5 %   Platelets  29 (LL) 150 - 400 K/uL    Comment: SPECIMEN CHECKED FOR CLOTS Immature Platelet Fraction may be clinically indicated, consider ordering this additional test [id number] REPEATED TO VERIFY PLATELET COUNT CONFIRMED BY SMEAR    nRBC 0.0 0.0 - 0.2 %   Neutrophils Relative % 87 %   Neutro Abs 6.1 1.7 - 7.7 K/uL   Lymphocytes Relative 4 %   Lymphs Abs 0.3 (L) 0.7 - 4.0 K/uL   Monocytes Relative 8 %   Monocytes Absolute 0.5 0.1 - 1.0 K/uL   Eosinophils Relative 0 %   Eosinophils Absolute 0.0 0.0 - 0.5 K/uL   Basophils Relative 0 %   Basophils Absolute 0.0 0.0 - 0.1 K/uL   Immature Granulocytes 1 %   Abs Immature Granulocytes 0.04 0.00 - 0.07 K/uL    Comment: Performed at [facility], [place] [address], [place], [place] [address]  Comprehensive metabolic panel     Status: Abnormal   Collection Time: 03/18/21 12:11 AM  Result Value Ref Range   Sodium 138 135 - 145 mmol/L   Potassium 3.6 3.5 - 5.1 mmol/L   Chloride 102 98 - 111 mmol/L   CO2 28 22 - 32 mmol/L   Glucose, Bld 121 (H) 70 - 99 mg/dL    Comment: Glucose reference range applies only to samples taken after fasting for at least 8 hours.   BUN 18 8 - 23 mg/dL   Creatinine, Ser 0.97 0.44 - 1.00 mg/dL   Calcium 9.0 8.9 - 10.3 mg/dL   Total Protein 7.4 6.5 - 8.1 g/dL   Albumin 3.4 (L) 3.5 - 5.0 g/dL   AST 25 15 - 41 U/L   ALT 18 0 - 44 U/L   Alkaline Phosphatase 80 38 - 126 U/L   Total Bilirubin 3.5 (H) 0.3 - 1.2 mg/dL   GFR, Estimated >60 >60 mL/min    Comment: (NOTE) Calculated using the CKD-EPI Creatinine Equation (2021)    Anion gap 8 5 - 15    Comment: Performed at [facility], [place] [address], [place], [place] [address]   DG Chest Portable 1 View  Result Date: 03/17/2021 CLINICAL DATA:  Hypoxia short of breath EXAM: PORTABLE CHEST 1 VIEW COMPARISON:  01/16/2021, CT 01/10/2021 FINDINGS: Cardiomegaly with vascular congestion and edema. Suspect  moderate bilateral pleural effusions,  likely contributing to asymmetric hazy opacity on the right. Airspace disease at the bases. IMPRESSION: Cardiomegaly with vascular congestion, bilateral effusions and edema. Basilar consolidations may reflect atelectasis or pneumonia Electronically Signed   By: Donavan Foil M.D.   On: 03/17/2021 21:55    Pending Labs Unresulted Labs (From admission, onward)    Start     Ordered   03/18/21 1610  Basic metabolic panel  Tomorrow morning,   R        03/18/21 0347   03/18/21 0500  CBC  Tomorrow morning,   R        03/18/21 0347   03/18/21 0348  HIV Antibody (routine testing w rflx)  (HIV Antibody (Routine testing w reflex) panel)  Once,   STAT        03/18/21 0347   03/18/21 0348  Hemoglobin A1c  Once,   STAT       Comments: To assess prior glycemic control    03/18/21 0347   03/18/21 0300  Brain natriuretic peptide  Once,   STAT        03/18/21 0300   03/17/21 2131  Culture, blood (routine x 2)  BLOOD CULTURE X 2,   STAT      03/17/21 2130   03/17/21 2131  Urinalysis, Routine w reflex microscopic  ONCE - STAT,   STAT        03/17/21 2130   03/17/21 2131  Urine culture  ONCE - STAT,   STAT        03/17/21 2130          Vitals/Pain Today's Vitals   03/17/21 2230 03/17/21 2339 03/18/21 0115 03/18/21 0152  BP: 107/61 102/63 (!) 101/53 (!) 116/53  Pulse: (!) 113 (!) 121 (!) 120   Resp: (!) 22 (!) 23 (!) 22   Temp:      TempSrc:      SpO2: 99% 100% 95%   PainSc:        Isolation Precautions Airborne and Contact precautions  Medications Medications  metoprolol tartrate (LOPRESSOR) tablet 50 mg (50 mg Oral Given 03/18/21 0152)  lactulose (CHRONULAC) 10 GM/15ML solution 20 g (20 g Oral Given 03/18/21 0252)  bumetanide (BUMEX) tablet 2 mg (has no administration in time range)  diltiazem (CARDIZEM CD) 24 hr capsule 360 mg (has no administration in time range)  spironolactone (ALDACTONE) tablet 25 mg (has no administration in time range)  insulin aspart (novoLOG) injection 0-15  Units (has no administration in time range)  insulin aspart (novoLOG) injection 0-5 Units (has no administration in time range)  furosemide (LASIX) injection 40 mg (has no administration in time range)  cefTRIAXone (ROCEPHIN) 2 g in sodium chloride 0.9 % 100 mL IVPB (has no administration in time range)  azithromycin (ZITHROMAX) 500 mg in sodium chloride 0.9 % 250 mL IVPB (has no administration in time range)  cefTRIAXone (ROCEPHIN) 1 g in sodium chloride 0.9 % 100 mL IVPB (0 g Intravenous Stopped 03/18/21 0227)  azithromycin (ZITHROMAX) 500 mg in sodium chloride 0.9 % 250 mL IVPB (0 mg Intravenous Stopped 03/18/21 0347)  diltiazem (CARDIZEM) injection 10 mg (10 mg Intravenous Given 03/18/21 0253)    Mobility walks with device

## 2021-03-19 ENCOUNTER — Encounter: Payer: Self-pay | Admitting: Oncology

## 2021-03-19 ENCOUNTER — Other Ambulatory Visit (HOSPITAL_COMMUNITY): Payer: BC Managed Care – PPO

## 2021-03-19 LAB — URINE CULTURE

## 2021-03-19 LAB — CBC
HCT: 29.4 % — ABNORMAL LOW (ref 36.0–46.0)
Hemoglobin: 9.5 g/dL — ABNORMAL LOW (ref 12.0–15.0)
MCH: 31.1 pg (ref 26.0–34.0)
MCHC: 32.3 g/dL (ref 30.0–36.0)
MCV: 96.4 fL (ref 80.0–100.0)
Platelets: 26 10*3/uL — CL (ref 150–400)
RBC: 3.05 MIL/uL — ABNORMAL LOW (ref 3.87–5.11)
RDW: 16 % — ABNORMAL HIGH (ref 11.5–15.5)
WBC: 4.2 10*3/uL (ref 4.0–10.5)
nRBC: 0 % (ref 0.0–0.2)

## 2021-03-19 LAB — GLUCOSE, CAPILLARY
Glucose-Capillary: 118 mg/dL — ABNORMAL HIGH (ref 70–99)
Glucose-Capillary: 140 mg/dL — ABNORMAL HIGH (ref 70–99)
Glucose-Capillary: 105 mg/dL — ABNORMAL HIGH (ref 70–99)
Glucose-Capillary: 125 mg/dL — ABNORMAL HIGH (ref 70–99)

## 2021-03-19 LAB — BASIC METABOLIC PANEL
Anion gap: 7 (ref 5–15)
BUN: 20 mg/dL (ref 8–23)
CO2: 30 mmol/L (ref 22–32)
Calcium: 8.6 mg/dL — ABNORMAL LOW (ref 8.9–10.3)
Chloride: 102 mmol/L (ref 98–111)
Creatinine, Ser: 1.07 mg/dL — ABNORMAL HIGH (ref 0.44–1.00)
GFR, Estimated: 58 mL/min — ABNORMAL LOW (ref >60)
Glucose, Bld: 126 mg/dL — ABNORMAL HIGH (ref 70–99)
Potassium: 3.6 mmol/L (ref 3.5–5.1)
Sodium: 139 mmol/L (ref 135–145)

## 2021-03-19 LAB — HEMOGLOBIN A1C
Hgb A1c MFr Bld: 5.4 % (ref 4.8–5.6)
Mean Plasma Glucose: 108 mg/dL

## 2021-03-19 MED ORDER — SALINE SPRAY 0.65 % NA SOLN
1.0000 | NASAL | Status: DC | PRN
  Administered 2021-03-19: 1 via NASAL
  Filled 2021-03-19 (×2): qty 44

## 2021-03-19 NOTE — Progress Notes (Signed)
PROGRESS NOTE    CERENA BAINE  VEH:[id number] DOB: Sep 01, 1956 DOA: 03/17/2021 PCP: Lujean Amel, MD     Brief Narrative:  QUIDA GLASSER is a 65 y.o. female with medical history significant for DMT2, Chronic A-fib, thrombocytopenia, HTN, HFpEF, fibromyalgia, OA, OSA on CPAP, NASH with cirrohosis.  She presents by EMS for evaluation of SOB, hypoxia and fever. She states she has not felt good the past 2 to 3 days with increasing shortness of breath and cough.  She developed chills and fever.  Reportedly she had a temperature of 102 degrees with EMS.  At home she was very short of breath and when she checked her oxygen saturation it was in the mid 86s.  As she sat down to rest and try to calm her breathing oxygen saturation improved to the mid 80s but never got back to her normal 95 to 96% she states.  EMS placed her on oxygen and transported to the hospital.   In the emergency department, patient was diagnosed with pulmonary edema as well as community-acquired pneumonia.  She developed A. fib RVR and was given IV Cardizem.  She was started on empiric antibiotics and IV Lasix.   New events last 24 hours / Subjective: Patient states that her breathing improved yesterday, but this morning feeling short of breath again.  Admits to right arm redness and tenderness to palpation similar to her previous episode of cellulitis in her lower extremities.  Did not have a fever overnight, but did feel some chills.  Also admits to some nosebleeds.  Assessment & Plan:   Principal Problem:   CAP (community acquired pneumonia) Active Problems:   Obstructive sleep apnea   Essential hypertension   Persistent atrial fibrillation (HCC)   Type 2 diabetes mellitus without complication, without long-term current use of insulin (HCC)   Thrombocytopenia (HCC)   Acute respiratory failure with hypoxia (HCC)   Liver cirrhosis secondary to NASH (nonalcoholic steatohepatitis) (HCC)   Acute hypoxemic respiratory  failure -Not on oxygen at baseline -Remains on 4 L nasal cannula O2, wean as able  Community-acquired pneumonia -Continue Rocephin, azithromycin  Right lower arm cellulitis -On Rocephin as above  Pulmonary edema -BNP 445, does have some peripheral edema as well -Echocardiogram pending -Continue IV Lasix, spironolactone, strict I's and O's, daily weight, fluid restriction diet  Persistent atrial fibrillation -Continue home metoprolol, Cardizem   Diabetes mellitus type 2 -Hemoglobin A1c pending -Continue sliding scale insulin  Chronic thrombocytopenia -Off anticoagulation -Monitor CBC  Cirrhosis secondary to Karlene Lineman -Continue home lactulose, Xifaxan   OSA -CPAP nightly   DVT prophylaxis:  Place TED hose Start: 03/18/21 1045  Code Status:     Code Status Orders  (From admission, onward)           Start     Ordered   03/18/21 0348  Full code  Continuous        03/18/21 0347           Code Status History     Date Active Date Inactive Code Status Order ID Comments User Context   01/19/2021 1016 01/22/2021 2035 DNR [id number]  Pershing Proud, NP Inpatient   01/10/2021 0401 01/19/2021 1016 Full Code [id number]  Vianne Bulls, MD ED   03/15/2020 0607 03/18/2020 2139 Full Code [id number]  Clance Boll, MD ED   09/07/2018 2347 09/08/2018 1722 Full Code [id number]  Elmarie Shiley, MD Inpatient   04/26/2018 1502 04/29/2018 1740 Full Code [id number]  Reubin Milan,  MD ED   03/15/2018 0325 03/23/2018 1548 Full Code [id number]  Etta Quill, DO Inpatient   03/15/2018 0245 03/15/2018 0325 Full Code [id number]  Reyne Dumas, MD Inpatient   10/31/2016 2201 11/02/2016 1849 Full Code [id number]  Edwin Dada, MD Inpatient   07/10/2016 1702 07/11/2016 2001 Full Code [id number]  Debbe Odea, MD ED   01/26/2016 1720 01/30/2016 1756 Full Code [id number]  Florinda Marker, MD ED   12/08/2015 2120 12/12/2015 1651 Full Code [id number]  Elgergawy, Silver Huguenin, MD Inpatient    01/31/2015 0243 02/01/2015 1559 Full Code [id number]  Lavina Hamman, MD ED   07/23/2013 1928 07/24/2013 1704 Full Code [id number]  Modena Jansky, MD ED      Family Communication: No family at bedside Disposition Plan:  Status is: Inpatient  Remains inpatient appropriate because:IV treatments appropriate due to intensity of illness or inability to take PO  Dispo: The patient is from: Home              Anticipated d/c is to: Home              Patient currently is not medically stable to d/c.   Difficult to place patient No     Antimicrobials:  Anti-infectives (From admission, onward)    Start     Dose/Rate Route Frequency Ordered Stop   03/19/21 0000  azithromycin (ZITHROMAX) 500 mg in sodium chloride 0.9 % 250 mL IVPB        500 mg 250 mL/hr over 60 Minutes Intravenous Every 24 hours 03/18/21 0353 03/22/21 2359   03/18/21 2200  cefTRIAXone (ROCEPHIN) 2 g in sodium chloride 0.9 % 100 mL IVPB        2 g 200 mL/hr over 30 Minutes Intravenous Every 24 hours 03/18/21 0353 03/22/21 2159   03/18/21 1000  rifaximin (XIFAXAN) tablet 550 mg        550 mg Oral 2 times daily 03/18/21 0808     03/18/21 0400  cefTRIAXone (ROCEPHIN) 2 g in sodium chloride 0.9 % 100 mL IVPB  Status:  Discontinued        2 g 200 mL/hr over 30 Minutes Intravenous Every 24 hours 03/18/21 0347 03/18/21 0352   03/18/21 0400  azithromycin (ZITHROMAX) 500 mg in sodium chloride 0.9 % 250 mL IVPB  Status:  Discontinued        500 mg 250 mL/hr over 60 Minutes Intravenous Every 24 hours 03/18/21 0347 03/18/21 0352   03/17/21 2230  cefTRIAXone (ROCEPHIN) 1 g in sodium chloride 0.9 % 100 mL IVPB        1 g 200 mL/hr over 30 Minutes Intravenous  Once 03/17/21 2217 03/18/21 0227   03/17/21 2230  azithromycin (ZITHROMAX) 500 mg in sodium chloride 0.9 % 250 mL IVPB        500 mg 250 mL/hr over 60 Minutes Intravenous  Once 03/17/21 2217 03/18/21 0347        Objective: Vitals:   03/19/21 0101 03/19/21 0436 03/19/21 0800  03/19/21 0824  BP: 110/70 110/62    Pulse: 72 75    Resp: 18 18    Temp: 98.3 F (36.8 C) 97.7 F (36.5 C)    TempSrc: Oral     SpO2:  95% 96% 96%  Weight: (!) 158.1 kg     Height: 5' 4"  (1.626 m)       Intake/Output Summary (Last 24 hours) at 03/19/2021 1208 Last data filed at 03/19/2021 1018 Gross per 24 hour  Intake 946 ml  Output 1100 ml  Net -154 ml   Filed Weights   03/19/21 0101  Weight: (!) 158.1 kg    Examination:  General exam: Appears calm and comfortable  Respiratory system: Diminished breath sounds without wheeze or rhonchi.  On nasal cannula O2.  No respiratory distress Cardiovascular system: S1 & S2 heard,  No murmurs. +1 pedal edema. Gastrointestinal system: Abdomen is nondistended, soft and nontender. Normal bowel sounds heard. Central nervous system: Alert and oriented. No focal neurological deficits. Speech clear.  Extremities: Symmetric in appearance  Skin: Right forearm with erythema and warmth to touch Psychiatry: Judgement and insight appear normal. Mood & affect appropriate.   Data Reviewed: I have personally reviewed following labs and imaging studies  CBC: Recent Labs  Lab 03/18/21 0011 03/18/21 0414 03/19/21 0443  WBC 7.0 8.9 4.2  NEUTROABS 6.1  --   --   HGB 11.2* 10.4* 9.5*  HCT 34.4* 32.0* 29.4*  MCV 96.4 96.7 96.4  PLT 29* 29* 26*   Basic Metabolic Panel: Recent Labs  Lab 03/18/21 0011 03/18/21 0414 03/19/21 0443  NA 138 137 139  K 3.6 3.7 3.6  CL 102 102 102  CO2 28 28 30   GLUCOSE 121* 152* 126*  BUN 18 19 20   CREATININE 0.97 0.91 1.07*  CALCIUM 9.0 8.8* 8.6*   GFR: Estimated Creatinine Clearance: 79.5 mL/min (A) (by C-G formula based on SCr of 1.07 mg/dL (H)). Liver Function Tests: Recent Labs  Lab 03/18/21 0011  AST 25  ALT 18  ALKPHOS 80  BILITOT 3.5*  PROT 7.4  ALBUMIN 3.4*   No results for input(s): LIPASE, AMYLASE in the last 168 hours. Recent Labs  Lab 03/17/21 2330  AMMONIA 28   Coagulation  Profile: No results for input(s): INR, PROTIME in the last 168 hours. Cardiac Enzymes: No results for input(s): CKTOTAL, CKMB, CKMBINDEX, TROPONINI in the last 168 hours. BNP (last 3 results) No results for input(s): PROBNP in the last 8760 hours. HbA1C: Recent Labs    03/18/21 0011  HGBA1C 5.4   CBG: Recent Labs  Lab 03/18/21 1125 03/18/21 1611 03/18/21 2141 03/19/21 0725 03/19/21 1151  GLUCAP 131* 134* 150* 118* 140*   Lipid Profile: No results for input(s): CHOL, HDL, LDLCALC, TRIG, CHOLHDL, LDLDIRECT in the last 72 hours. Thyroid Function Tests: No results for input(s): TSH, T4TOTAL, FREET4, T3FREE, THYROIDAB in the last 72 hours. Anemia Panel: No results for input(s): VITAMINB12, FOLATE, FERRITIN, TIBC, IRON, RETICCTPCT in the last 72 hours. Sepsis Labs: Recent Labs  Lab 03/17/21 2330  LATICACIDVEN 1.7    Recent Results (from the past 240 hour(s))  Resp Panel by RT-PCR (Flu A&B, Covid) Nasopharyngeal Swab     Status: None   Collection Time: 03/17/21  9:32 PM   Specimen: Nasopharyngeal Swab; Nasopharyngeal(NP) swabs in vial transport medium  Result Value Ref Range Status   SARS Coronavirus 2 by RT PCR NEGATIVE NEGATIVE Final    Comment: (NOTE) SARS-CoV-2 target nucleic acids are NOT DETECTED.  The SARS-CoV-2 RNA is generally detectable in upper respiratory specimens during the acute phase of infection. The lowest concentration of SARS-CoV-2 viral copies this assay can detect is 138 copies/mL. A negative result does not preclude SARS-Cov-2 infection and should not be used as the sole basis for treatment or other patient management decisions. A negative result may occur with  improper specimen collection/handling, submission of specimen other than nasopharyngeal swab, presence of viral mutation(s) within the areas targeted by this assay, and inadequate  number of viral copies(<138 copies/mL). A negative result must be combined with clinical observations, patient  history, and epidemiological information. The expected result is Negative.  Fact Sheet for Patients:  EntrepreneurPulse.com.au  Fact Sheet for Healthcare Providers:  IncredibleEmployment.be  This test is no t yet approved or cleared by the [place] FDA and  has been authorized for detection and/or diagnosis of SARS-CoV-2 by FDA under an Emergency Use Authorization (EUA). This EUA will remain  in effect (meaning this test can be used) for the duration of the COVID-19 declaration under Section 564(b)(1) of the Act, 21 U.S.C.section 360bbb-3(b)(1), unless the authorization is terminated  or revoked sooner.       Influenza A by PCR NEGATIVE NEGATIVE Final   Influenza B by PCR NEGATIVE NEGATIVE Final    Comment: (NOTE) The Xpert Xpress SARS-CoV-2/FLU/RSV plus assay is intended as an aid in the diagnosis of influenza from Nasopharyngeal swab specimens and should not be used as a sole basis for treatment. Nasal washings and aspirates are unacceptable for Xpert Xpress SARS-CoV-2/FLU/RSV testing.  Fact Sheet for Patients: EntrepreneurPulse.com.au  Fact Sheet for Healthcare Providers: IncredibleEmployment.be  This test is not yet approved or cleared by the [place] FDA and has been authorized for detection and/or diagnosis of SARS-CoV-2 by FDA under an Emergency Use Authorization (EUA). This EUA will remain in effect (meaning this test can be used) for the duration of the COVID-19 declaration under Section 564(b)(1) of the Act, 21 U.S.C. section 360bbb-3(b)(1), unless the authorization is terminated or revoked.  Performed at [facility], [place] [address], [place], [place] [address]   Culture, blood (routine x 2)     Status: None (Preliminary result)   Collection Time: 03/17/21  9:36 PM   Specimen: BLOOD  Result Value Ref Range Status   Specimen Description   Final    BLOOD LEFT  HAND Performed at [facility] [address], [place], [place] [address]    Special Requests   Final    BOTTLES DRAWN AEROBIC ONLY Blood Culture results may not be optimal due to an inadequate volume of blood received in culture bottles Performed at [facility] [address], [place], [place] [address]    Culture   Final    NO GROWTH 1 DAY Performed at [facility], [place] [address] [place], [place] [address]    Report Status PENDING  Incomplete  Urine culture     Status: Abnormal   Collection Time: 03/18/21  4:16 AM   Specimen: Urine, Clean Catch  Result Value Ref Range Status   Specimen Description   Final    URINE, CLEAN CATCH Performed at [facility], [place] [address] [place], [place] [address]    Special Requests   Final    NONE Performed at [facility], [place] [address], [place], [place] [address]    Culture MULTIPLE SPECIES PRESENT, SUGGEST RECOLLECTION (A)  Final   Report Status 03/19/2021 FINAL  Final  Culture, blood (routine x 2)     Status: None (Preliminary result)   Collection Time: 03/18/21  6:04 AM   Specimen: BLOOD LEFT HAND  Result Value Ref Range Status   Specimen Description   Final    BLOOD LEFT HAND Performed at [facility] [address] [place], [place] [address]    Special Requests   Final    BOTTLES DRAWN AEROBIC ONLY Blood Culture adequate volume Performed at [facility]  [facility], [place] [address], [place], [place] [address]    Culture   Final    NO GROWTH < 24 HOURS Performed at [facility] [address], [place], [place] [address]    Report Status PENDING  Incomplete      Radiology Studies: DG Chest Portable 1 View  Result Date: 03/17/2021 CLINICAL DATA:  Hypoxia short of breath EXAM: PORTABLE CHEST 1 VIEW COMPARISON:  01/16/2021, CT 01/10/2021 FINDINGS: Cardiomegaly with vascular congestion and edema. Suspect  moderate bilateral pleural effusions, likely contributing to asymmetric hazy opacity on the right. Airspace disease at the bases. IMPRESSION: Cardiomegaly with vascular congestion, bilateral effusions and edema. Basilar consolidations may reflect atelectasis or pneumonia Electronically Signed   By: Donavan Foil M.D.   On: 03/17/2021 21:55      Scheduled Meds:  diltiazem  360 mg Oral Daily   furosemide  40 mg Intravenous Q12H   insulin aspart  0-15 Units Subcutaneous TID WC   insulin aspart  0-5 Units Subcutaneous QHS   ipratropium  0.5 mg Nebulization Q6H   lactulose  20 g Oral TID   levalbuterol  0.63 mg Nebulization Q6H   metoprolol tartrate  50 mg Oral BID   rifaximin  550 mg Oral BID   spironolactone  25 mg Oral Daily   Continuous Infusions:  azithromycin 500 mg (03/18/21 2356)   cefTRIAXone (ROCEPHIN)  IV 2 g (03/18/21 2212)     LOS: 1 day      Time spent: 30 minutes   Dessa Phi, DO [facility] 03/19/2021, 12:08 PM   Available via Epic secure chat 7am-7pm After these hours, please refer to coverage provider listed on [email or web address]

## 2021-03-20 ENCOUNTER — Inpatient Hospital Stay (HOSPITAL_COMMUNITY): Payer: BC Managed Care – PPO

## 2021-03-20 DIAGNOSIS — I5043 Acute on chronic combined systolic (congestive) and diastolic (congestive) heart failure: Secondary | ICD-10-CM

## 2021-03-20 LAB — BASIC METABOLIC PANEL
Anion gap: 9 (ref 5–15)
BUN: 18 mg/dL (ref 8–23)
CO2: 30 mmol/L (ref 22–32)
Calcium: 8.4 mg/dL — ABNORMAL LOW (ref 8.9–10.3)
Chloride: 99 mmol/L (ref 98–111)
Creatinine, Ser: 0.89 mg/dL (ref 0.44–1.00)
GFR, Estimated: >60 mL/min (ref >60)
Glucose, Bld: 112 mg/dL — ABNORMAL HIGH (ref 70–99)
Potassium: 3.1 mmol/L — ABNORMAL LOW (ref 3.5–5.1)
Sodium: 138 mmol/L (ref 135–145)

## 2021-03-20 LAB — CBC
HCT: 29.9 % — ABNORMAL LOW (ref 36.0–46.0)
Hemoglobin: 9.5 g/dL — ABNORMAL LOW (ref 12.0–15.0)
MCH: 30.7 pg (ref 26.0–34.0)
MCHC: 31.8 g/dL (ref 30.0–36.0)
MCV: 96.8 fL (ref 80.0–100.0)
Platelets: 31 10*3/uL — ABNORMAL LOW (ref 150–400)
RBC: 3.09 MIL/uL — ABNORMAL LOW (ref 3.87–5.11)
RDW: 15.9 % — ABNORMAL HIGH (ref 11.5–15.5)
WBC: 3.5 10*3/uL — ABNORMAL LOW (ref 4.0–10.5)
nRBC: 0 % (ref 0.0–0.2)

## 2021-03-20 LAB — ECHOCARDIOGRAM COMPLETE
AR max vel: 2.28 cm2
AV Area VTI: 2.66 cm2
AV Area mean vel: 2.36 cm2
AV Mean grad: 8 mmHg
AV Peak grad: 13.8 mmHg
Ao pk vel: 1.86 m/s
Area-P 1/2: 5.05 cm2
Calc EF: 61.7 %
Height: 64 in
S' Lateral: 3.2 cm
Single Plane A2C EF: 63.8 %
Single Plane A4C EF: 65.8 %
Weight: 5844.84 oz

## 2021-03-20 LAB — GLUCOSE, CAPILLARY
Glucose-Capillary: 96 mg/dL (ref 70–99)
Glucose-Capillary: 127 mg/dL — ABNORMAL HIGH (ref 70–99)
Glucose-Capillary: 163 mg/dL — ABNORMAL HIGH (ref 70–99)
Glucose-Capillary: 131 mg/dL — ABNORMAL HIGH (ref 70–99)

## 2021-03-20 MED ORDER — POTASSIUM CHLORIDE CRYS ER 10 MEQ PO TBCR
40.0000 meq | EXTENDED_RELEASE_TABLET | ORAL | Status: AC
  Administered 2021-03-20 (×2): 40 meq via ORAL
  Filled 2021-03-20 (×2): qty 4

## 2021-03-20 MED ORDER — PERFLUTREN LIPID MICROSPHERE
1.0000 mL | INTRAVENOUS | Status: AC | PRN
  Administered 2021-03-20: 2 mL via INTRAVENOUS
  Filled 2021-03-20: qty 10

## 2021-03-20 NOTE — Progress Notes (Signed)
PROGRESS NOTE    [name]  KGM:[id number] DOB: Aug 28, 1956 DOA: 03/17/2021 PCP: Lujean Amel, MD     Brief Narrative:  Hannah Khan is a 65 y.o. female with medical history significant for DMT2, Chronic A-fib, thrombocytopenia, HTN, HFpEF, fibromyalgia, OA, OSA on CPAP, NASH with cirrohosis.  She presents by EMS for evaluation of SOB, hypoxia and fever. She states she has not felt good the past 2 to 3 days with increasing shortness of breath and cough.  She developed chills and fever.  Reportedly she had a temperature of 102 degrees with EMS.  At home she was very short of breath and when she checked her oxygen saturation it was in the mid 26s.  As she sat down to rest and try to calm her breathing oxygen saturation improved to the mid 80s but never got back to her normal 95 to 96% she states.  EMS placed her on oxygen and transported to the hospital.   In the emergency department, patient was diagnosed with pulmonary edema as well as community-acquired pneumonia.  She developed A. fib RVR and was given IV Cardizem.  She was started on empiric antibiotics and IV Lasix.   New events last 24 hours / Subjective: No new complaints today, right arm redness and tenderness has improved.  No worsening shortness of breath, although remains on 4 L oxygen.  Had 3 bowel movements yesterday.  Assessment & Plan:   Principal Problem:   CAP (community acquired pneumonia) Active Problems:   Obstructive sleep apnea   Essential hypertension   Persistent atrial fibrillation (HCC)   Type 2 diabetes mellitus without complication, without long-term current use of insulin (HCC)   Thrombocytopenia (HCC)   Acute respiratory failure with hypoxia (HCC)   Liver cirrhosis secondary to NASH (nonalcoholic steatohepatitis) (HCC)   Acute hypoxemic respiratory failure -Not on oxygen at baseline -Remains on 4 L nasal cannula O2, wean as able  Community-acquired pneumonia -Continue Rocephin,  azithromycin  Right lower arm cellulitis -On Rocephin as above  Acute on chronic diastolic heart failure -BNP 445, does have some peripheral edema as well -Echocardiogram EF 60 to 65%, mild LVH, diastolic parameters indeterminate -Continue IV Lasix, spironolactone, strict I's and O's, daily weight, fluid restriction diet  Persistent atrial fibrillation -Continue home metoprolol, Cardizem   Diabetes mellitus type 2, well controlled -Hemoglobin A1c 5.4 -Continue sliding scale insulin  Chronic thrombocytopenia -Off anticoagulation -Monitor CBC  Cirrhosis secondary to Karlene Lineman -Continue home lactulose, Xifaxan   OSA -CPAP nightly  Hypokalemia -Replace, trend   DVT prophylaxis:  Place TED hose Start: 03/18/21 1045  Code Status:     Code Status Orders  (From admission, onward)           Start     Ordered   03/18/21 0348  Full code  Continuous        03/18/21 0347           Code Status History     Date Active Date Inactive Code Status Order ID Comments User Context   01/19/2021 1016 01/22/2021 2035 DNR [id number]  Pershing Proud, NP Inpatient   01/10/2021 0401 01/19/2021 1016 Full Code [id number]  Vianne Bulls, MD ED   03/15/2020 0607 03/18/2020 2139 Full Code [id number]  Clance Boll, MD ED   09/07/2018 2347 09/08/2018 1722 Full Code [id number]  Elmarie Shiley, MD Inpatient   04/26/2018 1502 04/29/2018 1740 Full Code [id number]  Reubin Milan, MD ED   03/15/2018 [phone] 03/23/2018  [place] Full Code [id number]  Etta Quill, DO Inpatient   03/15/2018 0245 03/15/2018 0325 Full Code [id number]  Reyne Dumas, MD Inpatient   10/31/2016 2201 11/02/2016 1849 Full Code [id number]  Edwin Dada, MD Inpatient   07/10/2016 1702 07/11/2016 2001 Full Code [id number]  Debbe Odea, MD ED   01/26/2016 1720 01/30/2016 1756 Full Code [id number]  Florinda Marker, MD ED   12/08/2015 2120 12/12/2015 1651 Full Code [id number]  Elgergawy, Silver Huguenin, MD Inpatient   01/31/2015 0243  02/01/2015 1559 Full Code [id number]  Lavina Hamman, MD ED   07/23/2013 1928 07/24/2013 1704 Full Code [id number]  Modena Jansky, MD ED      Family Communication: No family at bedside Disposition Plan:  Status is: Inpatient  Remains inpatient appropriate because:IV treatments appropriate due to intensity of illness or inability to take PO  Dispo: The patient is from: Home              Anticipated d/c is to: Home              Patient currently is not medically stable to d/c.   Difficult to place patient No     Antimicrobials:  Anti-infectives (From admission, onward)    Start     Dose/Rate Route Frequency Ordered Stop   03/19/21 0000  azithromycin (ZITHROMAX) 500 mg in sodium chloride 0.9 % 250 mL IVPB        500 mg 250 mL/hr over 60 Minutes Intravenous Every 24 hours 03/18/21 0353 03/22/21 2359   03/18/21 2200  cefTRIAXone (ROCEPHIN) 2 g in sodium chloride 0.9 % 100 mL IVPB        2 g 200 mL/hr over 30 Minutes Intravenous Every 24 hours 03/18/21 0353 03/22/21 2159   03/18/21 1000  rifaximin (XIFAXAN) tablet 550 mg        550 mg Oral 2 times daily 03/18/21 0808     03/18/21 0400  cefTRIAXone (ROCEPHIN) 2 g in sodium chloride 0.9 % 100 mL IVPB  Status:  Discontinued        2 g 200 mL/hr over 30 Minutes Intravenous Every 24 hours 03/18/21 0347 03/18/21 0352   03/18/21 0400  azithromycin (ZITHROMAX) 500 mg in sodium chloride 0.9 % 250 mL IVPB  Status:  Discontinued        500 mg 250 mL/hr over 60 Minutes Intravenous Every 24 hours 03/18/21 0347 03/18/21 0352   03/17/21 2230  cefTRIAXone (ROCEPHIN) 1 g in sodium chloride 0.9 % 100 mL IVPB        1 g 200 mL/hr over 30 Minutes Intravenous  Once 03/17/21 2217 03/18/21 0227   03/17/21 2230  azithromycin (ZITHROMAX) 500 mg in sodium chloride 0.9 % 250 mL IVPB        500 mg 250 mL/hr over 60 Minutes Intravenous  Once 03/17/21 2217 03/18/21 0347        Objective: Vitals:   03/19/21 2054 03/20/21 0410 03/20/21 0500 03/20/21 0856   BP: (!) 112/42  112/70   Pulse: 76  90   Resp: 16  17   Temp: 97.7 F (36.5 C)  99.1 F (37.3 C)   TempSrc: Oral  Oral   SpO2: 98%  98% 94%  Weight:  (!) 165.7 kg    Height:        Intake/Output Summary (Last 24 hours) at 03/20/2021 1310 Last data filed at 03/20/2021 0900 Gross per 24 hour  Intake 480 ml  Output 400 ml  Net  80 ml    Filed Weights   03/19/21 0101 03/20/21 0410  Weight: (!) 158.1 kg (!) 165.7 kg   Examination: General exam: Appears calm and comfortable  Respiratory system: Diminished breath sounds but improved from previous exam.  Without wheeze or rhonchi.  Respiratory effort normal.  On 4 L oxygen Cardiovascular system: S1 & S2 heard, RRR. + Bilateral pedal edema. Gastrointestinal system: Abdomen is nondistended, soft and nontender. Normal bowel sounds heard. Central nervous system: Alert and oriented. Non focal exam. Speech clear  Extremities: Symmetric in appearance bilaterally  Skin: Erythema of the right forearm with improvement in appearance Psychiatry: Judgement and insight appear stable. Mood & affect appropriate.    Data Reviewed: I have personally reviewed following labs and imaging studies  CBC: Recent Labs  Lab 03/18/21 0011 03/18/21 0414 03/19/21 0443 03/20/21 0600  WBC 7.0 8.9 4.2 3.5*  NEUTROABS 6.1  --   --   --   HGB 11.2* 10.4* 9.5* 9.5*  HCT 34.4* 32.0* 29.4* 29.9*  MCV 96.4 96.7 96.4 96.8  PLT 29* 29* 26* 31*    Basic Metabolic Panel: Recent Labs  Lab 03/18/21 0011 03/18/21 0414 03/19/21 0443 03/20/21 0600  NA 138 137 139 138  K 3.6 3.7 3.6 3.1*  CL 102 102 102 99  CO2 28 28 30 30   GLUCOSE 121* 152* 126* 112*  BUN 18 19 20 18   CREATININE 0.97 0.91 1.07* 0.89  CALCIUM 9.0 8.8* 8.6* 8.4*    GFR: Estimated Creatinine Clearance: 98.6 mL/min (by C-G formula based on SCr of 0.89 mg/dL). Liver Function Tests: Recent Labs  Lab 03/18/21 0011  AST 25  ALT 18  ALKPHOS 80  BILITOT 3.5*  PROT 7.4  ALBUMIN 3.4*     No results for input(s): LIPASE, AMYLASE in the last 168 hours. Recent Labs  Lab 03/17/21 2330  AMMONIA 28    Coagulation Profile: No results for input(s): INR, PROTIME in the last 168 hours. Cardiac Enzymes: No results for input(s): CKTOTAL, CKMB, CKMBINDEX, TROPONINI in the last 168 hours. BNP (last 3 results) No results for input(s): PROBNP in the last 8760 hours. HbA1C: Recent Labs    03/18/21 0011  HGBA1C 5.4    CBG: Recent Labs  Lab 03/19/21 1151 03/19/21 1621 03/19/21 2055 03/20/21 0716 03/20/21 1143  GLUCAP 140* 105* 125* 96 127*    Lipid Profile: No results for input(s): CHOL, HDL, LDLCALC, TRIG, CHOLHDL, LDLDIRECT in the last 72 hours. Thyroid Function Tests: No results for input(s): TSH, T4TOTAL, FREET4, T3FREE, THYROIDAB in the last 72 hours. Anemia Panel: No results for input(s): VITAMINB12, FOLATE, FERRITIN, TIBC, IRON, RETICCTPCT in the last 72 hours. Sepsis Labs: Recent Labs  Lab 03/17/21 2330  LATICACIDVEN 1.7     Recent Results (from the past 240 hour(s))  Resp Panel by RT-PCR (Flu A&B, Covid) Nasopharyngeal Swab     Status: None   Collection Time: 03/17/21  9:32 PM   Specimen: Nasopharyngeal Swab; Nasopharyngeal(NP) swabs in vial transport medium  Result Value Ref Range Status   SARS Coronavirus 2 by RT PCR NEGATIVE NEGATIVE Final    Comment: (NOTE) SARS-CoV-2 target nucleic acids are NOT DETECTED.  The SARS-CoV-2 RNA is generally detectable in upper respiratory specimens during the acute phase of infection. The lowest concentration of SARS-CoV-2 viral copies this assay can detect is 138 copies/mL. A negative result does not preclude SARS-Cov-2 infection and should not be used as the sole basis for treatment or other patient management decisions. A negative result  may occur with  improper specimen collection/handling, submission of specimen other than nasopharyngeal swab, presence of viral mutation(s) within the areas targeted by  this assay, and inadequate number of viral copies(<138 copies/mL). A negative result must be combined with clinical observations, patient history, and epidemiological information. The expected result is Negative.  Fact Sheet for Patients:  EntrepreneurPulse.com.au  Fact Sheet for Healthcare Providers:  IncredibleEmployment.be  This test is no t yet approved or cleared by the [place] FDA and  has been authorized for detection and/or diagnosis of SARS-CoV-2 by FDA under an Emergency Use Authorization (EUA). This EUA will remain  in effect (meaning this test can be used) for the duration of the COVID-19 declaration under Section 564(b)(1) of the Act, 21 U.S.C.section 360bbb-3(b)(1), unless the authorization is terminated  or revoked sooner.       Influenza A by PCR NEGATIVE NEGATIVE Final   Influenza B by PCR NEGATIVE NEGATIVE Final    Comment: (NOTE) The Xpert Xpress SARS-CoV-2/FLU/RSV plus assay is intended as an aid in the diagnosis of influenza from Nasopharyngeal swab specimens and should not be used as a sole basis for treatment. Nasal washings and aspirates are unacceptable for Xpert Xpress SARS-CoV-2/FLU/RSV testing.  Fact Sheet for Patients: EntrepreneurPulse.com.au  Fact Sheet for Healthcare Providers: IncredibleEmployment.be  This test is not yet approved or cleared by the [place] FDA and has been authorized for detection and/or diagnosis of SARS-CoV-2 by FDA under an Emergency Use Authorization (EUA). This EUA will remain in effect (meaning this test can be used) for the duration of the COVID-19 declaration under Section 564(b)(1) of the Act, 21 U.S.C. section 360bbb-3(b)(1), unless the authorization is terminated or revoked.  Performed at [facility], [place] [address] [place], [place] [address]   Culture, blood (routine x 2)     Status: None (Preliminary  result)   Collection Time: 03/17/21  9:36 PM   Specimen: BLOOD  Result Value Ref Range Status   Specimen Description   Final    BLOOD LEFT HAND Performed at [facility] [address], [place], [place] [address]    Special Requests   Final    BOTTLES DRAWN AEROBIC ONLY Blood Culture results may not be optimal due to an inadequate volume of blood received in culture bottles Performed at [facility] [address], [place], [place] [address]    Culture   Final    NO GROWTH 2 DAYS Performed at [facility] [address], [place], [place] [address]    Report Status PENDING  Incomplete  Urine culture     Status: Abnormal   Collection Time: 03/18/21  4:16 AM   Specimen: Urine, Clean Catch  Result Value Ref Range Status   Specimen Description   Final    URINE, CLEAN CATCH Performed at [facility], [place] [address] [place], [place] [address]    Special Requests   Final    NONE Performed at [facility], [place] [address] [place],  [address]    Culture MULTIPLE SPECIES PRESENT, SUGGEST RECOLLECTION (A)  Final   Report Status 03/19/2021 FINAL  Final  Culture, blood (routine x 2)     Status: None (Preliminary result)   Collection Time: 03/18/21  6:04 AM   Specimen: BLOOD LEFT HAND  Result Value Ref Range Status   Specimen Description   Final    BLOOD LEFT HAND Performed at [facility]  [address] [place], [place] [address]    Special Requests   Final    BOTTLES DRAWN AEROBIC ONLY Blood Culture adequate volume Performed at [facility] [address], [place], [place] [address]    Culture   Final    NO GROWTH 2 DAYS Performed at [facility] [address], [place], [place] [address]    Report Status PENDING  Incomplete       Radiology Studies: ECHOCARDIOGRAM COMPLETE  Result Date: 03/20/2021    ECHOCARDIOGRAM REPORT   Patient Name:    NYIA TSAO Date of Exam: 03/20/2021 Medical Rec #:  [id number]       Height:       64.0 in Accession #:    [id number]      Weight:       365.3 lb Date of Birth:  May 24, 1956       BSA:          2.527 m Patient Age:    51 years        BP:           112/70 mmHg Patient Gender: F               HR:           105 bpm. Exam Location:  Inpatient Procedure: 2D Echo, Cardiac Doppler, Color Doppler and Intracardiac            Opacification Agent Indications:    I50.40* Unspecified combined systolic (congestive) and diastolic                 (congestive) heart failure  History:        Patient has prior history of Echocardiogram examinations, most                 recent 03/17/2020. Abnormal ECG, Arrythmias:Atrial Fibrillation,                 Signs/Symptoms:Dyspnea, Shortness of Breath, Murmur and Fever;                 Risk Factors:Sleep Apnea, Hypertension and Diabetes.  Sonographer:    Roseanna Rainbow RDCS Referring Phys: [id number] [facility]  Sonographer Comments: Technically difficult study due to poor echo windows, patient is morbidly obese, suboptimal parasternal window, suboptimal apical window and suboptimal subcostal window. Image acquisition challenging due to patient body habitus. Very difficult study. Only off axis images. IMPRESSIONS  1. Technically difficult; normal LV function; enlarged right heart.  2. Left ventricular ejection fraction, by estimation, is 60 to 65%. The left ventricle has normal function. The left ventricle has no regional wall motion abnormalities. There is mild left ventricular hypertrophy. Left ventricular diastolic parameters are indeterminate.  3. Right ventricular systolic function is normal. The right ventricular size is moderately enlarged. There is normal pulmonary artery systolic pressure.  4. Left atrial size was mildly dilated.  5. Right atrial size was severely dilated.  6. Large pleural effusion.  7. The mitral valve is normal in structure. Trivial mitral valve regurgitation. No  evidence of mitral stenosis.  8. The aortic valve is calcified. Aortic valve regurgitation is not visualized. No aortic stenosis is present.  9. Aortic dilatation noted. There is borderline dilatation of the ascending aorta, measuring 38 mm. 10. The inferior vena cava is dilated in size with >50% respiratory variability, suggesting right atrial pressure of 8 mmHg. FINDINGS  Left Ventricle: Left ventricular ejection fraction, by estimation, is 60 to 65%. The left  ventricle has normal function. The left ventricle has no regional wall motion abnormalities. Definity contrast agent was given IV to delineate the left ventricular  endocardial borders. The left ventricular internal cavity size was normal in size. There is mild left ventricular hypertrophy. Left ventricular diastolic parameters are indeterminate. Right Ventricle: The right ventricular size is moderately enlarged. Right ventricular systolic function is normal. There is normal pulmonary artery systolic pressure. The tricuspid regurgitant velocity is 2.08 m/s, and with an assumed right atrial pressure of 8 mmHg, the estimated right ventricular systolic pressure is 86.7 mmHg. Left Atrium: Left atrial size was mildly dilated. Right Atrium: Right atrial size was severely dilated. Pericardium: Trivial pericardial effusion is present. Mitral Valve: The mitral valve is normal in structure. Trivial mitral valve regurgitation. No evidence of mitral valve stenosis. Tricuspid Valve: The tricuspid valve is normal in structure. Tricuspid valve regurgitation is mild . No evidence of tricuspid stenosis. Aortic Valve: The aortic valve is calcified. Aortic valve regurgitation is not visualized. No aortic stenosis is present. Aortic valve mean gradient measures 8.0 mmHg. Aortic valve peak gradient measures 13.8 mmHg. Aortic valve area, by VTI measures 2.66  cm. Pulmonic Valve: The pulmonic valve was not well visualized. Pulmonic valve regurgitation is trivial. No evidence of  pulmonic stenosis. Aorta: The aortic root is normal in size and structure and aortic dilatation noted. There is borderline dilatation of the ascending aorta, measuring 38 mm. Venous: The inferior vena cava is dilated in size with greater than 50% respiratory variability, suggesting right atrial pressure of 8 mmHg.  Additional Comments: Technically difficult; normal LV function; enlarged right heart. There is a large pleural effusion.  LEFT VENTRICLE PLAX 2D LVIDd:         4.90 cm LVIDs:         3.20 cm LV PW:         1.60 cm LV IVS:        1.60 cm LVOT diam:     2.00 cm LV SV:         82 LV SV Index:   32 LVOT Area:     3.14 cm  LV Volumes (MOD) LV vol d, MOD A2C: 38.4 ml LV vol d, MOD A4C: 85.6 ml LV vol s, MOD A2C: 13.9 ml LV vol s, MOD A4C: 29.3 ml LV SV MOD A2C:     24.5 ml LV SV MOD A4C:     85.6 ml LV SV MOD BP:      44.9 ml IVC IVC diam: 4.50 cm LEFT ATRIUM           Index       RIGHT ATRIUM           Index LA diam:      4.20 cm 1.66 cm/m  RA Area:     34.50 cm LA Vol (A2C): 22.8 ml 9.02 ml/m  RA Volume:   129.00 ml 51.04 ml/m LA Vol (A4C): 80.3 ml 31.77 ml/m  AORTIC VALVE                    PULMONIC VALVE AV Area (Vmax):    2.28 cm     PR End Diast Vel: 1.77 msec AV Area (Vmean):   2.36 cm AV Area (VTI):     2.66 cm AV Vmax:           186.00 cm/s AV Vmean:          133.000 cm/s AV VTI:  0.307 m AV Peak Grad:      13.8 mmHg AV Mean Grad:      8.0 mmHg LVOT Vmax:         135.00 cm/s LVOT Vmean:        100.000 cm/s LVOT VTI:          0.260 m LVOT/AV VTI ratio: 0.85  AORTA Ao Root diam: 3.10 cm Ao Asc diam:  3.80 cm MITRAL VALVE                TRICUSPID VALVE MV Area (PHT): 5.05 cm     TR Peak grad:   17.3 mmHg MV Decel Time: 150 msec     TR Vmax:        208.00 cm/s MV E velocity: 149.33 cm/s                             SHUNTS                             Systemic VTI:  0.26 m                             Systemic Diam: 2.00 cm Kirk Ruths MD Electronically signed by Kirk Ruths MD  Signature Date/Time: 03/20/2021/11:44:35 AM    Final       Scheduled Meds:  diltiazem  360 mg Oral Daily   furosemide  40 mg Intravenous Q12H   insulin aspart  0-15 Units Subcutaneous TID WC   insulin aspart  0-5 Units Subcutaneous QHS   ipratropium  0.5 mg Nebulization Q6H   lactulose  20 g Oral TID   levalbuterol  0.63 mg Nebulization Q6H   metoprolol tartrate  50 mg Oral BID   potassium chloride  40 mEq Oral Q4H   rifaximin  550 mg Oral BID   spironolactone  25 mg Oral Daily   Continuous Infusions:  azithromycin 500 mg (03/19/21 2346)   cefTRIAXone (ROCEPHIN)  IV 2 g (03/19/21 2223)     LOS: 2 days      Time spent: 30 minutes   Dessa Phi, DO [facility] 03/20/2021, 1:10 PM   Available via Epic secure chat 7am-7pm After these hours, please refer to coverage provider listed on [email or web address]

## 2021-03-20 NOTE — Evaluation (Signed)
Physical Therapy Evaluation Patient Details Name: Hannah Khan MRN: [id number] DOB: 15-Oct-1955 Today's Date: 03/20/2021   History of Present Illness  Pt is a 65 y.o. female with recent R shoulder sx (12/30/20), now admitted 01/10/21 with AMS, somnolence. Pt with acute hypoxic respiratory failure, acute metabolic encephalopathy; potential polypharmacy. Head CT negative for acute findings. Liver failure. PMH includes afib, DM2, OSA, cirrhosis, obesity.  Clinical Impression  Pt tolerated mobilization room very well. Pt is used to using a rollator at home due to the short distances she is able to walk. Today in room on 2 L O2 with short distances using rollator, pt did have 2/4 dyspnea with about 15-20 feet ambulation. 91% O2 then recovered to about 94% O2 on 2L. Discussed importance exercises to continue and how to progress walking and tolerance to activity. Pt agreed and understood. Pt 's main goal is to wean off )2 prior to Dc and nursing can assess this due to her walking with RW is supervision level or less. No further PT needs at this time. Did discuss potential for f/u with cardio/pulmonary rehab and speak to her primary MD about the possibilities of this.     Follow Up Recommendations No PT follow up    Equipment Recommendations  None recommended by PT    Recommendations for Other Services       Precautions / Restrictions Precautions Precaution Comments: monitor sats Restrictions Weight Bearing Restrictions: No      Mobility  Bed Mobility Overal bed mobility: Modified Independent                  Transfers Overall transfer level: Needs assistance Equipment used: Rolling walker (2 wheeled) Transfers: Sit to/from Stand;Stand Pivot Transfers Sit to Stand: Supervision Stand pivot transfers: Supervision       General transfer comment: ambulated short distance in room with RW with supervision. O2  sat maintained WFL on 4 L   Ambulation/Gait Ambulation/Gait  assistance: Supervision Gait Distance (Feet): 15 Feet (performed 3x in room with seated rest breaks for SOB and O2 check) Assistive device: 4-wheeled walker Gait Pattern/deviations: Step-through pattern     General Gait Details: pt very mobile and fast with rollator, but limited due to dyspnea with little activity. had to do short walks and then rest with deep breathing exercises and then walk again  Stairs            Wheelchair Mobility    Modified Rankin (Stroke Patients Only)       Balance Overall balance assessment: Mild deficits observed, not formally tested                                           Pertinent Vitals/Pain Pain Assessment: No/denies pain    Home Living Family/patient expects to be discharged to:: Private residence Living Arrangements: Spouse/significant other Available Help at Discharge: Family;Available 24 hours/day Type of Home: House Home Access: Stairs to enter Entrance Stairs-Rails: None Entrance Stairs-Number of Steps: 2 Home Layout: One level Home Equipment: Walker - 4 wheels;Cane - single point;Tub bench;Bedside commode;Adaptive equipment Additional Comments: husband can offer minimal physical assist    Prior Function Level of Independence: Needs assistance   Gait / Transfers Assistance Needed: uses rollator for ambulation.           Hand Dominance   Dominant Hand: Right    Extremity/Trunk Assessment   Upper Extremity  Assessment Upper Extremity Assessment: Overall WFL for tasks assessed (decreased RUE shoulder ROM and strength from prior injury and surgery.)    Lower Extremity Assessment Lower Extremity Assessment: Overall WFL for tasks assessed    Cervical / Trunk Assessment Cervical / Trunk Assessment: Normal  Communication   Communication: No difficulties  Cognition Arousal/Alertness: Awake/alert Behavior During Therapy: WFL for tasks assessed/performed Overall Cognitive Status: Within Functional  Limits for tasks assessed                                        General Comments      Exercises Other Exercises Other Exercises: educated and reviewed cerval exercises that are good for profusion and activity tolerance: pursed lip breathing, knee extensions with hold for 5 seconds , BLE and sit to stands 5 times in a row.   Assessment/Plan    PT Assessment Patent does not need any further PT services (pt with progress herself , vbery self aware and will just need to slowly progress her activity tolerance and would like to wean off 02)  PT Problem List         PT Treatment Interventions      PT Goals (Current goals can be found in the Care Plan section)  Acute Rehab PT Goals Patient Stated Goal: I have to get ready for my next trip PT Goal Formulation: All assessment and education complete, DC therapy    Frequency     Barriers to discharge        Co-evaluation               AM-PAC PT "6 Clicks" Mobility  Outcome Measure Help needed turning from your back to your side while in a flat bed without using bedrails?: None Help needed moving from lying on your back to sitting on the side of a flat bed without using bedrails?: None Help needed moving to and from a bed to a chair (including a wheelchair)?: None Help needed standing up from a chair using your arms (e.g., wheelchair or bedside chair)?: None Help needed to walk in hospital room?: A Little Help needed climbing 3-5 steps with a railing? : A Little 6 Click Score: 22    End of Session   Activity Tolerance: Patient tolerated treatment well Patient left: in chair;with family/visitor present Nurse Communication: Mobility status PT Visit Diagnosis: Other abnormalities of gait and mobility (R26.89)    Time: 1445-1520 PT Time Calculation (min) (ACUTE ONLY): 35 min   Charges:   PT Evaluation $PT Eval Low Complexity: 1 Low PT Treatments $Gait Training: 8-22 mins        Teira Arcilla, PT,  MPT Acute Rehabilitation Services Office: [phone] Pager: [phone] 03/20/2021   Clide Dales 03/20/2021, 5:45 PM

## 2021-03-20 NOTE — Evaluation (Signed)
Occupational Therapy Evaluation Patient Details Name: Hannah Khan MRN: [id number] DOB: [birth year] Today's Date: 03/20/2021    History of Present Illness Pt is a 65 y.o. female with recent R shoulder sx (12/30/20), now admitted 01/10/21 with AMS, somnolence. Pt with acute hypoxic respiratory failure, acute metabolic encephalopathy; potential polypharmacy. Head CT negative for acute findings. Liver failure. PMH includes afib, DM2, OSA, cirrhosis, obesity.   Clinical Impression   Hannah Khan is a 65 year old woman who presents on 4 L [place] but able to perform baseline functional mobility and ADLs - though somewhat limited in hospital setting without typical DME and AE. Patient appears to be at her baseline except for current o2 needs. No overt shortness of breath or complaints of excessive fatigue with in room activity. Patient has assistance of husband and all needed DME at home. Patient had just began PT for shoulder therapy prior to admission. Return to OP therapy. Did discuss patient may be a candidate for pulmonary/cardiac rehab in the future. No further OT needs.    Follow Up Recommendations  No OT follow up    Equipment Recommendations  None recommended by OT    Recommendations for Other Services       Precautions / Restrictions Precautions Precaution Comments: monitor sats Restrictions Weight Bearing Restrictions: No      Mobility Bed Mobility Overal bed mobility: Modified Independent                  Transfers Overall transfer level: Needs assistance Equipment used: Rolling walker (2 wheeled) Transfers: Sit to/from Stand;Stand Pivot Transfers Sit to Stand: Supervision Stand pivot transfers: Supervision       General transfer comment: ambulated short distance in room with RW with supervision. O2  sat maintained WFL on 4 L [place]    Balance Overall balance assessment: Mild deficits observed, not formally tested                                          ADL either performed or assessed with clinical judgement   ADL Overall ADL's : At baseline                                             Vision Patient Visual Report: No change from baseline       Perception     Praxis      Pertinent Vitals/Pain Pain Assessment: No/denies pain     Hand Dominance     Extremity/Trunk Assessment Upper Extremity Assessment Upper Extremity Assessment: Overall WFL for tasks assessed (decreased RUE shoulder ROM and strength from prior injury and surgery.)   Lower Extremity Assessment Lower Extremity Assessment: Defer to PT evaluation   Cervical / Trunk Assessment Cervical / Trunk Assessment: Normal   Communication     Cognition Arousal/Alertness: Awake/alert Behavior During Therapy: WFL for tasks assessed/performed Overall Cognitive Status: Within Functional Limits for tasks assessed                                     General Comments       Exercises     Shoulder Instructions      Home Living  Prior Functioning/Environment                   OT Problem List: Cardiopulmonary status limiting activity      OT Treatment/Interventions:      OT Goals(Current goals can be found in the care plan section) Acute Rehab OT Goals OT Goal Formulation: All assessment and education complete, DC therapy  OT Frequency:     Barriers to D/C:            Co-evaluation              AM-PAC OT "6 Clicks" Daily Activity     Outcome Measure Help from another person eating meals?: None Help from another person taking care of personal grooming?: None Help from another person toileting, which includes using toliet, bedpan, or urinal?: None Help from another person bathing (including washing, rinsing, drying)?: None Help from another person to put on and taking off regular upper body clothing?: None Help from another person to put on  and taking off regular lower body clothing?: None 6 Click Score: 24   End of Session Equipment Utilized During Treatment: Rolling walker Nurse Communication: Mobility status  Activity Tolerance: Patient tolerated treatment well Patient left: in bed;with call bell/phone within reach  OT Visit Diagnosis: Muscle weakness (generalized) (M62.81)                Time: 2706-2376 OT Time Calculation (min): 24 min Charges:  OT General Charges $OT Visit: 1 Visit OT Evaluation $OT Eval Low Complexity: 1 Low  Deangela Randleman, OTR/L [place]  Office [phone] Pager: [phone]   Lenward Chancellor 03/20/2021, 4:42 PM

## 2021-03-20 NOTE — Progress Notes (Signed)
  Echocardiogram 2D Echocardiogram has been performed.  Hannah Khan 03/20/2021, 9:02 AM

## 2021-03-21 LAB — GLUCOSE, CAPILLARY
Glucose-Capillary: 149 mg/dL — ABNORMAL HIGH (ref 70–99)
Glucose-Capillary: 142 mg/dL — ABNORMAL HIGH (ref 70–99)
Glucose-Capillary: 139 mg/dL — ABNORMAL HIGH (ref 70–99)
Glucose-Capillary: 128 mg/dL — ABNORMAL HIGH (ref 70–99)

## 2021-03-21 LAB — CBC
HCT: 30.5 % — ABNORMAL LOW (ref 36.0–46.0)
Hemoglobin: 9.8 g/dL — ABNORMAL LOW (ref 12.0–15.0)
MCH: 30.5 pg (ref 26.0–34.0)
MCHC: 32.1 g/dL (ref 30.0–36.0)
MCV: 95 fL (ref 80.0–100.0)
Platelets: 39 10*3/uL — ABNORMAL LOW (ref 150–400)
RBC: 3.21 MIL/uL — ABNORMAL LOW (ref 3.87–5.11)
RDW: 15.6 % — ABNORMAL HIGH (ref 11.5–15.5)
WBC: 3.7 10*3/uL — ABNORMAL LOW (ref 4.0–10.5)
nRBC: 0 % (ref 0.0–0.2)

## 2021-03-21 LAB — BASIC METABOLIC PANEL
Anion gap: 9 (ref 5–15)
BUN: 19 mg/dL (ref 8–23)
CO2: 30 mmol/L (ref 22–32)
Calcium: 8.9 mg/dL (ref 8.9–10.3)
Chloride: 99 mmol/L (ref 98–111)
Creatinine, Ser: 0.89 mg/dL (ref 0.44–1.00)
GFR, Estimated: >60 mL/min (ref >60)
Glucose, Bld: 138 mg/dL — ABNORMAL HIGH (ref 70–99)
Potassium: 3.3 mmol/L — ABNORMAL LOW (ref 3.5–5.1)
Sodium: 138 mmol/L (ref 135–145)

## 2021-03-21 LAB — MAGNESIUM: Magnesium: 1.8 mg/dL (ref 1.7–2.4)

## 2021-03-21 MED ORDER — POTASSIUM CHLORIDE CRYS ER 10 MEQ PO TBCR
40.0000 meq | EXTENDED_RELEASE_TABLET | ORAL | Status: AC
  Administered 2021-03-21 (×2): 40 meq via ORAL
  Filled 2021-03-21 (×2): qty 4

## 2021-03-21 MED ORDER — ALBUTEROL SULFATE (2.5 MG/3ML) 0.083% IN NEBU: 2.5000 mg | INHALATION_SOLUTION | RESPIRATORY_TRACT | Status: DC | PRN

## 2021-03-21 MED ORDER — IPRATROPIUM-ALBUTEROL 0.5-2.5 (3) MG/3ML IN SOLN
3.0000 mL | Freq: Four times a day (QID) | RESPIRATORY_TRACT | Status: DC
  Administered 2021-03-22 (×2): 3 mL via RESPIRATORY_TRACT
  Filled 2021-03-21 (×2): qty 3

## 2021-03-21 NOTE — Progress Notes (Signed)
PROGRESS NOTE    SAXON CROSBY  MLJ:[id number] DOB: 1956-06-09 DOA: 03/17/2021 PCP: Lujean Amel, MD     Brief Narrative:  Hannah Khan is a 65 y.o. female with medical history significant for DMT2, Chronic A-fib, thrombocytopenia, HTN, HFpEF, fibromyalgia, OA, OSA on CPAP, NASH with cirrohosis.  She presents by EMS for evaluation of SOB, hypoxia and fever. She states she has not felt good the past 2 to 3 days with increasing shortness of breath and cough.  She developed chills and fever.  Reportedly she had a temperature of 102 degrees with EMS.  At home she was very short of breath and when she checked her oxygen saturation it was in the mid 90s.  As she sat down to rest and try to calm her breathing oxygen saturation improved to the mid 80s but never got back to her normal 95 to 96% she states.  EMS placed her on oxygen and transported to the hospital.   In the emergency department, patient was diagnosed with pulmonary edema as well as community-acquired pneumonia.  She developed A. fib RVR and was given IV Cardizem.  She was started on empiric antibiotics and IV Lasix.   New events last 24 hours / Subjective: Didn't sleep well, IV issues overnight. Down to 2L O2 this morning, sitting at bedside eating breakfast. R arm redness improving. Still having nose bleeds but improving.   Assessment & Plan:   Principal Problem:   CAP (community acquired pneumonia) Active Problems:   Obstructive sleep apnea   Essential hypertension   Persistent atrial fibrillation (HCC)   Type 2 diabetes mellitus without complication, without long-term current use of insulin (HCC)   Thrombocytopenia (HCC)   Acute respiratory failure with hypoxia (HCC)   Liver cirrhosis secondary to NASH (nonalcoholic steatohepatitis) (HCC)   Acute hypoxemic respiratory failure -Not on oxygen at baseline -Remains on 2 L nasal cannula O2, wean as able  Community-acquired pneumonia -Continue Rocephin,  azithromycin  Right lower arm cellulitis -On Rocephin as above -Improving   Acute on chronic diastolic heart failure -BNP 445, does have some peripheral edema as well -Echocardiogram EF 60 to 65%, mild LVH, diastolic parameters indeterminate -Continue IV Lasix, spironolactone, strict I's and O's, daily weight, fluid restriction diet  Persistent atrial fibrillation -Continue home metoprolol, Cardizem   Diabetes mellitus type 2, well controlled -Hemoglobin A1c 5.4 -Continue sliding scale insulin  Chronic thrombocytopenia -Off anticoagulation -Monitor CBC  Cirrhosis secondary to Karlene Lineman -Continue home lactulose, Xifaxan   OSA -CPAP nightly  Hypokalemia -Replace, trend   DVT prophylaxis:  Place TED hose Start: 03/18/21 1045  Code Status:     Code Status Orders  (From admission, onward)           Start     Ordered   03/18/21 0348  Full code  Continuous        03/18/21 0347           Code Status History     Date Active Date Inactive Code Status Order ID Comments User Context   01/19/2021 1016 01/22/2021 2035 DNR [id number]  Pershing Proud, NP Inpatient   01/10/2021 0401 01/19/2021 1016 Full Code [id number]  Vianne Bulls, MD ED   03/15/2020 0607 03/18/2020 2139 Full Code [id number]  Clance Boll, MD ED   09/07/2018 2347 09/08/2018 1722 Full Code [id number]  Elmarie Shiley, MD Inpatient   04/26/2018 1502 04/29/2018 1740 Full Code [id number]  Reubin Milan, MD ED   03/15/2018 [phone]  03/23/2018 1548 Full Code [id number]  Etta Quill, DO Inpatient   03/15/2018 0245 03/15/2018 0325 Full Code [id number]  Reyne Dumas, MD Inpatient   10/31/2016 2201 11/02/2016 1849 Full Code [id number]  Edwin Dada, MD Inpatient   07/10/2016 1702 07/11/2016 2001 Full Code [id number]  Debbe Odea, MD ED   01/26/2016 1720 01/30/2016 1756 Full Code [id number]  Florinda Marker, MD ED   12/08/2015 2120 12/12/2015 1651 Full Code [id number]  Elgergawy, Silver Huguenin, MD Inpatient    01/31/2015 0243 02/01/2015 1559 Full Code [id number]  Lavina Hamman, MD ED   07/23/2013 1928 07/24/2013 1704 Full Code [id number]  Modena Jansky, MD ED      Family Communication: No family at bedside Disposition Plan:  Status is: Inpatient  Remains inpatient appropriate because:IV treatments appropriate due to intensity of illness or inability to take PO  Dispo: The patient is from: Home              Anticipated d/c is to: Home              Patient currently is not medically stable to d/c. Continue IV antibiotics and IV lasix, hopeful discharge home Monday or Tuesday    Difficult to place patient No     Antimicrobials:  Anti-infectives (From admission, onward)    Start     Dose/Rate Route Frequency Ordered Stop   03/19/21 0000  azithromycin (ZITHROMAX) 500 mg in sodium chloride 0.9 % 250 mL IVPB        500 mg 250 mL/hr over 60 Minutes Intravenous Every 24 hours 03/18/21 0353 03/22/21 2359   03/18/21 2200  cefTRIAXone (ROCEPHIN) 2 g in sodium chloride 0.9 % 100 mL IVPB        2 g 200 mL/hr over 30 Minutes Intravenous Every 24 hours 03/18/21 0353 03/22/21 2159   03/18/21 1000  rifaximin (XIFAXAN) tablet 550 mg        550 mg Oral 2 times daily 03/18/21 0808     03/18/21 0400  cefTRIAXone (ROCEPHIN) 2 g in sodium chloride 0.9 % 100 mL IVPB  Status:  Discontinued        2 g 200 mL/hr over 30 Minutes Intravenous Every 24 hours 03/18/21 0347 03/18/21 0352   03/18/21 0400  azithromycin (ZITHROMAX) 500 mg in sodium chloride 0.9 % 250 mL IVPB  Status:  Discontinued        500 mg 250 mL/hr over 60 Minutes Intravenous Every 24 hours 03/18/21 0347 03/18/21 0352   03/17/21 2230  cefTRIAXone (ROCEPHIN) 1 g in sodium chloride 0.9 % 100 mL IVPB        1 g 200 mL/hr over 30 Minutes Intravenous  Once 03/17/21 2217 03/18/21 0227   03/17/21 2230  azithromycin (ZITHROMAX) 500 mg in sodium chloride 0.9 % 250 mL IVPB        500 mg 250 mL/hr over 60 Minutes Intravenous  Once 03/17/21 2217 03/18/21  0347        Objective: Vitals:   03/21/21 0145 03/21/21 0441 03/21/21 0500 03/21/21 0813  BP:  (!) 98/50    Pulse:  82    Resp:  20    Temp:  97.8 F (36.6 C)    TempSrc:  Oral    SpO2: 100% 95%  (!) 89%  Weight:   (!) 164.4 kg   Height:        Intake/Output Summary (Last 24 hours) at 03/21/2021 6314 Last data filed at 03/20/2021 1806 Gross per  24 hour  Intake 1440 ml  Output --  Net 1440 ml    Filed Weights   03/19/21 0101 03/20/21 0410 03/21/21 0500  Weight: (!) 158.1 kg (!) 165.7 kg (!) 164.4 kg   Examination: General exam: Appears calm and comfortable  Respiratory system: Diminished breath sounds bilaterally, no rhonchi, wheeze, no distress  Cardiovascular system: S1 & S2 heard, RRR. + pedal edema. Gastrointestinal system: Abdomen is nondistended, soft and nontender. Normal bowel sounds heard. Central nervous system: Alert and oriented. Non focal exam. Speech clear  Extremities: Symmetric in appearance bilaterally  Skin: Right forearm redness with improvement  Psychiatry: Judgement and insight appear stable. Mood & affect appropriate.    Data Reviewed: I have personally reviewed following labs and imaging studies  CBC: Recent Labs  Lab 03/18/21 0011 03/18/21 0414 03/19/21 0443 03/20/21 0600 03/21/21 0547  WBC 7.0 8.9 4.2 3.5* 3.7*  NEUTROABS 6.1  --   --   --   --   HGB 11.2* 10.4* 9.5* 9.5* 9.8*  HCT 34.4* 32.0* 29.4* 29.9* 30.5*  MCV 96.4 96.7 96.4 96.8 95.0  PLT 29* 29* 26* 31* 39*    Basic Metabolic Panel: Recent Labs  Lab 03/18/21 0011 03/18/21 0414 03/19/21 0443 03/20/21 0600 03/21/21 0547  NA 138 137 139 138 138  K 3.6 3.7 3.6 3.1* 3.3*  CL 102 102 102 99 99  CO2 28 28 30 30 30   GLUCOSE 121* 152* 126* 112* 138*  BUN 18 19 20 18 19   CREATININE 0.97 0.91 1.07* 0.89 0.89  CALCIUM 9.0 8.8* 8.6* 8.4* 8.9  MG  --   --   --   --  1.8    GFR: Estimated Creatinine Clearance: 98.1 mL/min (by C-G formula based on SCr of 0.89 mg/dL). Liver  Function Tests: Recent Labs  Lab 03/18/21 0011  AST 25  ALT 18  ALKPHOS 80  BILITOT 3.5*  PROT 7.4  ALBUMIN 3.4*    No results for input(s): LIPASE, AMYLASE in the last 168 hours. Recent Labs  Lab 03/17/21 2330  AMMONIA 28    Coagulation Profile: No results for input(s): INR, PROTIME in the last 168 hours. Cardiac Enzymes: No results for input(s): CKTOTAL, CKMB, CKMBINDEX, TROPONINI in the last 168 hours. BNP (last 3 results) No results for input(s): PROBNP in the last 8760 hours. HbA1C: No results for input(s): HGBA1C in the last 72 hours.  CBG: Recent Labs  Lab 03/19/21 2055 03/20/21 0716 03/20/21 1143 03/20/21 1659 03/20/21 2055  GLUCAP 125* 96 127* 163* 131*    Lipid Profile: No results for input(s): CHOL, HDL, LDLCALC, TRIG, CHOLHDL, LDLDIRECT in the last 72 hours. Thyroid Function Tests: No results for input(s): TSH, T4TOTAL, FREET4, T3FREE, THYROIDAB in the last 72 hours. Anemia Panel: No results for input(s): VITAMINB12, FOLATE, FERRITIN, TIBC, IRON, RETICCTPCT in the last 72 hours. Sepsis Labs: Recent Labs  Lab 03/17/21 2330  LATICACIDVEN 1.7     Recent Results (from the past 240 hour(s))  Resp Panel by RT-PCR (Flu A&B, Covid) Nasopharyngeal Swab     Status: None   Collection Time: 03/17/21  9:32 PM   Specimen: Nasopharyngeal Swab; Nasopharyngeal(NP) swabs in vial transport medium  Result Value Ref Range Status   SARS Coronavirus 2 by RT PCR NEGATIVE NEGATIVE Final    Comment: (NOTE) SARS-CoV-2 target nucleic acids are NOT DETECTED.  The SARS-CoV-2 RNA is generally detectable in upper respiratory specimens during the acute phase of infection. The lowest concentration of SARS-CoV-2 viral copies this  assay can detect is 138 copies/mL. A negative result does not preclude SARS-Cov-2 infection and should not be used as the sole basis for treatment or other patient management decisions. A negative result may occur with  improper specimen  collection/handling, submission of specimen other than nasopharyngeal swab, presence of viral mutation(s) within the areas targeted by this assay, and inadequate number of viral copies(<138 copies/mL). A negative result must be combined with clinical observations, patient history, and epidemiological information. The expected result is Negative.  Fact Sheet for Patients:  EntrepreneurPulse.com.au  Fact Sheet for Healthcare Providers:  IncredibleEmployment.be  This test is no t yet approved or cleared by the [place] FDA and  has been authorized for detection and/or diagnosis of SARS-CoV-2 by FDA under an Emergency Use Authorization (EUA). This EUA will remain  in effect (meaning this test can be used) for the duration of the COVID-19 declaration under Section 564(b)(1) of the Act, 21 U.S.C.section 360bbb-3(b)(1), unless the authorization is terminated  or revoked sooner.       Influenza A by PCR NEGATIVE NEGATIVE Final   Influenza B by PCR NEGATIVE NEGATIVE Final    Comment: (NOTE) The Xpert Xpress SARS-CoV-2/FLU/RSV plus assay is intended as an aid in the diagnosis of influenza from Nasopharyngeal swab specimens and should not be used as a sole basis for treatment. Nasal washings and aspirates are unacceptable for Xpert Xpress SARS-CoV-2/FLU/RSV testing.  Fact Sheet for Patients: EntrepreneurPulse.com.au  Fact Sheet for Healthcare Providers: IncredibleEmployment.be  This test is not yet approved or cleared by the [place] FDA and has been authorized for detection and/or diagnosis of SARS-CoV-2 by FDA under an Emergency Use Authorization (EUA). This EUA will remain in effect (meaning this test can be used) for the duration of the COVID-19 declaration under Section 564(b)(1) of the Act, 21 U.S.C. section 360bbb-3(b)(1), unless the authorization is terminated or revoked.  Performed at [facility], [place] [address], [place], [place] [address]   Culture, blood (routine x 2)     Status: None (Preliminary result)   Collection Time: 03/17/21  9:36 PM   Specimen: BLOOD  Result Value Ref Range Status   Specimen Description   Final    BLOOD LEFT HAND Performed at [facility] [address] [place], [place] [address]    Special Requests   Final    BOTTLES DRAWN AEROBIC ONLY Blood Culture results may not be optimal due to an inadequate volume of blood received in culture bottles Performed at [facility] [address] [place], [place] [address]    Culture   Final    NO GROWTH 2 DAYS Performed at [facility] [address], [place], [place] [address]    Report Status PENDING  Incomplete  Urine culture     Status: Abnormal   Collection Time: 03/18/21  4:16 AM   Specimen: Urine, Clean Catch  Result Value Ref Range Status   Specimen Description   Final    URINE, CLEAN CATCH Performed at [facility], [place] [address], [place], [place] [address]    Special Requests   Final    NONE Performed at [facility], [place] [address] [place],  [address]    Culture MULTIPLE SPECIES PRESENT, SUGGEST RECOLLECTION (A)  Final   Report Status 03/19/2021 FINAL  Final  Culture, blood (routine x 2)     Status: None (Preliminary result)   Collection Time: 03/18/21  6:04 AM  Specimen: BLOOD LEFT HAND  Result Value Ref Range Status   Specimen Description   Final    BLOOD LEFT HAND Performed at [facility] [address], [place], [place] [address]    Special Requests   Final    BOTTLES DRAWN AEROBIC ONLY Blood Culture adequate volume Performed at [facility] [address] [place], [place] [address]    Culture   Final    NO GROWTH 2 DAYS Performed at [facility] [address], [place], [place] [address]    Report Status PENDING   Incomplete       Radiology Studies: ECHOCARDIOGRAM COMPLETE  Result Date: 03/20/2021    ECHOCARDIOGRAM REPORT   Patient Name:   JONICA BICKHART Date of Exam: 03/20/2021 Medical Rec #:  [id number]       Height:       64.0 in Accession #:    [id number]      Weight:       365.3 lb Date of Birth:  09-08-1956       BSA:          2.527 m Patient Age:    44 years        BP:           112/70 mmHg Patient Gender: F               HR:           105 bpm. Exam Location:  Inpatient Procedure: 2D Echo, Cardiac Doppler, Color Doppler and Intracardiac            Opacification Agent Indications:    I50.40* Unspecified combined systolic (congestive) and diastolic                 (congestive) heart failure  History:        Patient has prior history of Echocardiogram examinations, most                 recent 03/17/2020. Abnormal ECG, Arrythmias:Atrial Fibrillation,                 Signs/Symptoms:Dyspnea, Shortness of Breath, Murmur and Fever;                 Risk Factors:Sleep Apnea, Hypertension and Diabetes.  Sonographer:    Roseanna Rainbow RDCS Referring Phys: [id number] [facility]  Sonographer Comments: Technically difficult study due to poor echo windows, patient is morbidly obese, suboptimal parasternal window, suboptimal apical window and suboptimal subcostal window. Image acquisition challenging due to patient body habitus. Very difficult study. Only off axis images. IMPRESSIONS  1. Technically difficult; normal LV function; enlarged right heart.  2. Left ventricular ejection fraction, by estimation, is 60 to 65%. The left ventricle has normal function. The left ventricle has no regional wall motion abnormalities. There is mild left ventricular hypertrophy. Left ventricular diastolic parameters are indeterminate.  3. Right ventricular systolic function is normal. The right ventricular size is moderately enlarged. There is normal pulmonary artery systolic pressure.  4. Left atrial size was mildly dilated.  5. Right atrial size  was severely dilated.  6. Large pleural effusion.  7. The mitral valve is normal in structure. Trivial mitral valve regurgitation. No evidence of mitral stenosis.  8. The aortic valve is calcified. Aortic valve regurgitation is not visualized. No aortic stenosis is present.  9. Aortic dilatation noted. There is borderline dilatation of the ascending aorta, measuring 38 mm. 10. The inferior vena  cava is dilated in size with >50% respiratory variability, suggesting right atrial pressure of 8 mmHg. FINDINGS  Left Ventricle: Left ventricular ejection fraction, by estimation, is 60 to 65%. The left ventricle has normal function. The left ventricle has no regional wall motion abnormalities. Definity contrast agent was given IV to delineate the left ventricular  endocardial borders. The left ventricular internal cavity size was normal in size. There is mild left ventricular hypertrophy. Left ventricular diastolic parameters are indeterminate. Right Ventricle: The right ventricular size is moderately enlarged. Right ventricular systolic function is normal. There is normal pulmonary artery systolic pressure. The tricuspid regurgitant velocity is 2.08 m/s, and with an assumed right atrial pressure of 8 mmHg, the estimated right ventricular systolic pressure is 71.2 mmHg. Left Atrium: Left atrial size was mildly dilated. Right Atrium: Right atrial size was severely dilated. Pericardium: Trivial pericardial effusion is present. Mitral Valve: The mitral valve is normal in structure. Trivial mitral valve regurgitation. No evidence of mitral valve stenosis. Tricuspid Valve: The tricuspid valve is normal in structure. Tricuspid valve regurgitation is mild . No evidence of tricuspid stenosis. Aortic Valve: The aortic valve is calcified. Aortic valve regurgitation is not visualized. No aortic stenosis is present. Aortic valve mean gradient measures 8.0 mmHg. Aortic valve peak gradient measures 13.8 mmHg. Aortic valve area, by VTI  measures 2.66  cm. Pulmonic Valve: The pulmonic valve was not well visualized. Pulmonic valve regurgitation is trivial. No evidence of pulmonic stenosis. Aorta: The aortic root is normal in size and structure and aortic dilatation noted. There is borderline dilatation of the ascending aorta, measuring 38 mm. Venous: The inferior vena cava is dilated in size with greater than 50% respiratory variability, suggesting right atrial pressure of 8 mmHg.  Additional Comments: Technically difficult; normal LV function; enlarged right heart. There is a large pleural effusion.  LEFT VENTRICLE PLAX 2D LVIDd:         4.90 cm LVIDs:         3.20 cm LV PW:         1.60 cm LV IVS:        1.60 cm LVOT diam:     2.00 cm LV SV:         82 LV SV Index:   32 LVOT Area:     3.14 cm  LV Volumes (MOD) LV vol d, MOD A2C: 38.4 ml LV vol d, MOD A4C: 85.6 ml LV vol s, MOD A2C: 13.9 ml LV vol s, MOD A4C: 29.3 ml LV SV MOD A2C:     24.5 ml LV SV MOD A4C:     85.6 ml LV SV MOD BP:      44.9 ml IVC IVC diam: 4.50 cm LEFT ATRIUM           Index       RIGHT ATRIUM           Index LA diam:      4.20 cm 1.66 cm/m  RA Area:     34.50 cm LA Vol (A2C): 22.8 ml 9.02 ml/m  RA Volume:   129.00 ml 51.04 ml/m LA Vol (A4C): 80.3 ml 31.77 ml/m  AORTIC VALVE                    PULMONIC VALVE AV Area (Vmax):    2.28 cm     PR End Diast Vel: 1.77 msec AV Area (Vmean):   2.36 cm AV Area (VTI):     2.66 cm AV  Vmax:           186.00 cm/s AV Vmean:          133.000 cm/s AV VTI:            0.307 m AV Peak Grad:      13.8 mmHg AV Mean Grad:      8.0 mmHg LVOT Vmax:         135.00 cm/s LVOT Vmean:        100.000 cm/s LVOT VTI:          0.260 m LVOT/AV VTI ratio: 0.85  AORTA Ao Root diam: 3.10 cm Ao Asc diam:  3.80 cm MITRAL VALVE                TRICUSPID VALVE MV Area (PHT): 5.05 cm     TR Peak grad:   17.3 mmHg MV Decel Time: 150 msec     TR Vmax:        208.00 cm/s MV E velocity: 149.33 cm/s                             SHUNTS                              Systemic VTI:  0.26 m                             Systemic Diam: 2.00 cm Kirk Ruths MD Electronically signed by Kirk Ruths MD Signature Date/Time: 03/20/2021/11:44:35 AM    Final       Scheduled Meds:  diltiazem  360 mg Oral Daily   furosemide  40 mg Intravenous Q12H   insulin aspart  0-15 Units Subcutaneous TID WC   insulin aspart  0-5 Units Subcutaneous QHS   ipratropium  0.5 mg Nebulization Q6H   lactulose  20 g Oral TID   levalbuterol  0.63 mg Nebulization Q6H   metoprolol tartrate  50 mg Oral BID   potassium chloride  40 mEq Oral Q4H   rifaximin  550 mg Oral BID   spironolactone  25 mg Oral Daily   Continuous Infusions:  azithromycin 500 mg (03/20/21 2306)   cefTRIAXone (ROCEPHIN)  IV 2 g (03/20/21 2119)     LOS: 3 days      Time spent: 20  minutes   Dessa Phi, DO [facility] 03/21/2021, 8:33 AM   Available via Epic secure chat 7am-7pm After these hours, please refer to coverage provider listed on [email or web address]

## 2021-03-22 LAB — BASIC METABOLIC PANEL
Anion gap: 11 (ref 5–15)
BUN: 19 mg/dL (ref 8–23)
CO2: 29 mmol/L (ref 22–32)
Calcium: 9 mg/dL (ref 8.9–10.3)
Chloride: 99 mmol/L (ref 98–111)
Creatinine, Ser: 0.83 mg/dL (ref 0.44–1.00)
GFR, Estimated: >60 mL/min (ref >60)
Glucose, Bld: 119 mg/dL — ABNORMAL HIGH (ref 70–99)
Potassium: 3.5 mmol/L (ref 3.5–5.1)
Sodium: 139 mmol/L (ref 135–145)

## 2021-03-22 LAB — CBC
HCT: 30.8 % — ABNORMAL LOW (ref 36.0–46.0)
Hemoglobin: 10.2 g/dL — ABNORMAL LOW (ref 12.0–15.0)
MCH: 31.4 pg (ref 26.0–34.0)
MCHC: 33.1 g/dL (ref 30.0–36.0)
MCV: 94.8 fL (ref 80.0–100.0)
Platelets: 42 10*3/uL — ABNORMAL LOW (ref 150–400)
RBC: 3.25 MIL/uL — ABNORMAL LOW (ref 3.87–5.11)
RDW: 15.9 % — ABNORMAL HIGH (ref 11.5–15.5)
WBC: 3.6 10*3/uL — ABNORMAL LOW (ref 4.0–10.5)
nRBC: 0 % (ref 0.0–0.2)

## 2021-03-22 LAB — GLUCOSE, CAPILLARY
Glucose-Capillary: 121 mg/dL — ABNORMAL HIGH (ref 70–99)
Glucose-Capillary: 135 mg/dL — ABNORMAL HIGH (ref 70–99)

## 2021-03-22 MED ORDER — POTASSIUM CHLORIDE ER 10 MEQ PO CPCR: 20.0000 meq | ORAL_CAPSULE | Freq: Every day | ORAL | 2 refills | Status: DC

## 2021-03-22 MED ORDER — ALBUTEROL SULFATE (2.5 MG/3ML) 0.083% IN NEBU: 2.5000 mg | INHALATION_SOLUTION | Freq: Four times a day (QID) | RESPIRATORY_TRACT | 0 refills | Status: DC | PRN

## 2021-03-22 NOTE — Discharge Summary (Signed)
Physician Discharge Summary  [name] JKD:[id number] DOB: [birth year] DOA: 03/17/2021  PCP: Lujean Amel, MD  Admit date: 03/17/2021 Discharge date: 03/22/2021  Admitted From: Home Disposition:  Home  Recommendations for Outpatient Follow-up:  Follow up with PCP in 1 week Follow up with Cardiology in 2-3 weeks   Discharge Condition: Stable CODE STATUS: Full  Diet recommendation: Heart healthy   Brief/Interim Summary: Hannah Khan is a 65 y.o. female with medical history significant for DMT2, Chronic A-fib, thrombocytopenia, HTN, HFpEF, fibromyalgia, OA, OSA on CPAP, NASH with cirrohosis.  She presents by EMS for evaluation of SOB, hypoxia and fever. She states she has not felt good the past 2 to 3 days with increasing shortness of breath and cough.  She developed chills and fever.  Reportedly she had a temperature of 102 degrees with EMS.  At home she was very short of breath and when she checked her oxygen saturation it was in the mid 41s.  As she sat down to rest and try to calm her breathing oxygen saturation improved to the mid 80s but never got back to her normal 95 to 96% she states.  EMS placed her on oxygen and transported to the hospital.   In the emergency department, patient was diagnosed with pulmonary edema as well as community-acquired pneumonia.  She developed A. fib RVR and was given IV Cardizem.  She was started on empiric antibiotics and IV Lasix.  Patient completed 5 day course of IV antibiotics. She diuresed well on Lasix. O2 was weaned to room air without issue prior to discharge home.   Discharge Diagnoses:  Principal Problem:   CAP (community acquired pneumonia) Active Problems:   Obstructive sleep apnea   Essential hypertension   Persistent atrial fibrillation (HCC)   Type 2 diabetes mellitus without complication, without long-term current use of insulin (HCC)   Thrombocytopenia (HCC)   Acute respiratory failure with hypoxia (HCC)   Liver cirrhosis  secondary to NASH (nonalcoholic steatohepatitis) (HCC)  Acute hypoxemic respiratory failure -Not on oxygen at baseline -Now weaned to room air   Community-acquired pneumonia -Completed 5 days Rocephin, azithromycin   Right lower arm cellulitis -Completed 5 days Rocephin as above -Improving   Acute on chronic diastolic heart failure -BNP 445, does have some peripheral edema as well -Echocardiogram EF 60 to 65%, mild LVH, diastolic parameters indeterminate -Continue bumex, spironolactone, strict I's and O's, daily weight, fluid restriction diet  Persistent atrial fibrillation -Continue home metoprolol, Cardizem -Not on anticoag due to chronic thrombocytopenia    Diabetes mellitus type 2, well controlled -Hemoglobin A1c 5.4  Chronic thrombocytopenia -Off anticoagulation -Monitor CBC  Cirrhosis secondary to Karlene Lineman -Continue home lactulose, Xifaxan   OSA -CPAP nightly    Discharge Instructions  Discharge Instructions     (HEART FAILURE PATIENTS) Call MD:  Anytime you have any of the following symptoms: 1) 3 pound weight gain in 24 hours or 5 pounds in 1 week 2) shortness of breath, with or without a dry hacking cough 3) swelling in the hands, feet or stomach 4) if you have to sleep on extra pillows at night in order to breathe.   Complete by: As directed    Call MD for:  difficulty breathing, headache or visual disturbances   Complete by: As directed    Call MD for:  extreme fatigue   Complete by: As directed    Call MD for:  persistant dizziness or light-headedness   Complete by: As directed    Call  MD for:  persistant nausea and vomiting   Complete by: As directed    Call MD for:  severe uncontrolled pain   Complete by: As directed    Call MD for:  temperature >100.4   Complete by: As directed    Discharge instructions   Complete by: As directed    You were cared for by a hospitalist during your hospital stay. If you have any questions about your discharge  medications or the care you received while you were in the hospital after you are discharged, you can call the unit and ask to speak with the hospitalist on call if the hospitalist that took care of you is not available. Once you are discharged, your primary care physician will handle any further medical issues. Please note that NO REFILLS for any discharge medications will be authorized once you are discharged, as it is imperative that you return to your primary care physician (or establish a relationship with a primary care physician if you do not have one) for your aftercare needs so that they can reassess your need for medications and monitor your lab values.   Increase activity slowly   Complete by: As directed       Allergies as of 03/22/2021       Reactions   Celecoxib Other (See Comments)   "speech slurred" with celebrex   Shellfish Allergy Swelling, Rash   TINGLING AND SWELLING OF LIPS. LARGE WHELPS QUARTER-SIZE   Barium-containing Compounds Other (See Comments)   TACHYCARDIA   Prednisone Other (See Comments)   TACHYCARDIA   Statins Other (See Comments)   AGGRAVATED FIBROMYALGIA   Fentanyl    Hallucinations    Ibuprofen Other (See Comments)   UNSPECIFIED SPECIFIC REACTION >> "DUE TO PLATELETS"   Tramadol    Hallucination   Tylenol [acetaminophen] Other (See Comments)   UNSPECIFIED SPECIFIC REACTION >> "DUE TO PLATELETS"        Medication List     STOP taking these medications    albuterol 108 (90 Base) MCG/ACT inhaler Commonly known as: VENTOLIN HFA Replaced by: albuterol (2.5 MG/3ML) 0.083% nebulizer solution       TAKE these medications    albuterol (2.5 MG/3ML) 0.083% nebulizer solution Commonly known as: PROVENTIL Take 3 mLs (2.5 mg total) by nebulization every 6 (six) hours as needed for wheezing or shortness of breath. Replaces: albuterol 108 (90 Base) MCG/ACT inhaler   bumetanide 2 MG tablet Commonly known as: BUMEX Take 1 tablet (2 mg total) by  mouth daily.   diltiazem 360 MG 24 hr capsule Commonly known as: CARDIZEM CD Take 1 capsule (360 mg total) by mouth daily.   glimepiride 1 MG tablet Commonly known as: AMARYL Take 1 tablet (1 mg total) by mouth daily with lunch.   lactulose 10 GM/15ML solution Commonly known as: CHRONULAC Take 30 mLs by mouth 3 (three) times daily.   lidocaine 5 % Commonly known as: LIDODERM Place 1 patch onto the skin daily. Remove & Discard patch within 12 hours or as directed by MD   metoprolol tartrate 50 MG tablet Commonly known as: LOPRESSOR Take 1 tablet (50 mg total) by mouth 2 (two) times daily.   OneTouch Delica Plus [id number] Misc Apply topically 3 (three) times daily.   OneTouch Verio test strip Generic drug: glucose blood 1 each 3 (three) times daily.   potassium chloride 10 MEQ CR capsule Commonly known as: MICRO-K Take 2 capsules (20 mEq total) by mouth daily. What changed: how much  to take   spironolactone 25 MG tablet Commonly known as: ALDACTONE Take 1 tablet (25 mg total) by mouth daily.   Xifaxan 550 MG Tabs tablet Generic drug: rifaximin Take 550 mg by mouth 2 (two) times daily.               Durable Medical Equipment  (From admission, onward)           Start     Ordered   03/22/21 0851  For home use only DME Nebulizer machine  Once       Question Answer Comment  Patient needs a nebulizer to treat with the following condition Dyspnea   Length of Need 6 Months      03/22/21 0850            Follow-up Information     Koirala, Dibas, MD. Schedule an appointment as soon as possible for a visit in 1 week(s).   Specialty: Family Medicine Contact information: [address] [address] [phone]         Skeet Latch, MD. Schedule an appointment as soon as possible for a visit in 2 week(s).   Specialty: Cardiology Contact information: [address] [place]  [address] [phone]                Allergies  Allergen Reactions   Celecoxib Other (See Comments)    "speech slurred" with celebrex   Shellfish Allergy Swelling and Rash    TINGLING AND SWELLING OF LIPS. LARGE WHELPS QUARTER-SIZE   Barium-Containing Compounds Other (See Comments)    TACHYCARDIA   Prednisone Other (See Comments)    TACHYCARDIA   Statins Other (See Comments)    AGGRAVATED FIBROMYALGIA   Fentanyl     Hallucinations    Ibuprofen Other (See Comments)    UNSPECIFIED SPECIFIC REACTION >> "DUE TO PLATELETS"   Tramadol     Hallucination    Tylenol [Acetaminophen] Other (See Comments)    UNSPECIFIED SPECIFIC REACTION >> "DUE TO PLATELETS"    Consultations: None    Procedures/Studies: DG Chest Portable 1 View  Result Date: 03/17/2021 CLINICAL DATA:  Hypoxia short of breath EXAM: PORTABLE CHEST 1 VIEW COMPARISON:  01/16/2021, CT 01/10/2021 FINDINGS: Cardiomegaly with vascular congestion and edema. Suspect moderate bilateral pleural effusions, likely contributing to asymmetric hazy opacity on the right. Airspace disease at the bases. IMPRESSION: Cardiomegaly with vascular congestion, bilateral effusions and edema. Basilar consolidations may reflect atelectasis or pneumonia Electronically Signed   By: Donavan Foil M.D.   On: 03/17/2021 21:55   ECHOCARDIOGRAM COMPLETE  Result Date: 03/20/2021    ECHOCARDIOGRAM REPORT   Patient Name:   Hannah Khan Date of Exam: 03/20/2021 Medical Rec #:  [id number]       Height:       64.0 in Accession #:    [id number]      Weight:       365.3 lb Date of Birth:  09/03/1956       BSA:          2.527 m Patient Age:    29 years        BP:           112/70 mmHg Patient Gender: F               HR:           105 bpm. Exam Location:  Inpatient Procedure: 2D Echo, Cardiac Doppler, Color Doppler and Intracardiac  Opacification Agent Indications:    I50.40* Unspecified combined systolic (congestive) and diastolic                  (congestive) heart failure  History:        Patient has prior history of Echocardiogram examinations, most                 recent 03/17/2020. Abnormal ECG, Arrythmias:Atrial Fibrillation,                 Signs/Symptoms:Dyspnea, Shortness of Breath, Murmur and Fever;                 Risk Factors:Sleep Apnea, Hypertension and Diabetes.  Sonographer:    Roseanna Rainbow RDCS Referring Phys: [id number] [employer]  Sonographer Comments: Technically difficult study due to poor echo windows, patient is morbidly obese, suboptimal parasternal window, suboptimal apical window and suboptimal subcostal window. Image acquisition challenging due to patient body habitus. Very difficult study. Only off axis images. IMPRESSIONS  1. Technically difficult; normal LV function; enlarged right heart.  2. Left ventricular ejection fraction, by estimation, is 60 to 65%. The left ventricle has normal function. The left ventricle has no regional wall motion abnormalities. There is mild left ventricular hypertrophy. Left ventricular diastolic parameters are indeterminate.  3. Right ventricular systolic function is normal. The right ventricular size is moderately enlarged. There is normal pulmonary artery systolic pressure.  4. Left atrial size was mildly dilated.  5. Right atrial size was severely dilated.  6. Large pleural effusion.  7. The mitral valve is normal in structure. Trivial mitral valve regurgitation. No evidence of mitral stenosis.  8. The aortic valve is calcified. Aortic valve regurgitation is not visualized. No aortic stenosis is present.  9. Aortic dilatation noted. There is borderline dilatation of the ascending aorta, measuring 38 mm. 10. The inferior vena cava is dilated in size with >50% respiratory variability, suggesting right atrial pressure of 8 mmHg. FINDINGS  Left Ventricle: Left ventricular ejection fraction, by estimation, is 60 to 65%. The left ventricle has normal function. The left ventricle has no regional wall  motion abnormalities. Definity contrast agent was given IV to delineate the left ventricular  endocardial borders. The left ventricular internal cavity size was normal in size. There is mild left ventricular hypertrophy. Left ventricular diastolic parameters are indeterminate. Right Ventricle: The right ventricular size is moderately enlarged. Right ventricular systolic function is normal. There is normal pulmonary artery systolic pressure. The tricuspid regurgitant velocity is 2.08 m/s, and with an assumed right atrial pressure of 8 mmHg, the estimated right ventricular systolic pressure is 44.0 mmHg. Left Atrium: Left atrial size was mildly dilated. Right Atrium: Right atrial size was severely dilated. Pericardium: Trivial pericardial effusion is present. Mitral Valve: The mitral valve is normal in structure. Trivial mitral valve regurgitation. No evidence of mitral valve stenosis. Tricuspid Valve: The tricuspid valve is normal in structure. Tricuspid valve regurgitation is mild . No evidence of tricuspid stenosis. Aortic Valve: The aortic valve is calcified. Aortic valve regurgitation is not visualized. No aortic stenosis is present. Aortic valve mean gradient measures 8.0 mmHg. Aortic valve peak gradient measures 13.8 mmHg. Aortic valve area, by VTI measures 2.66  cm. Pulmonic Valve: The pulmonic valve was not well visualized. Pulmonic valve regurgitation is trivial. No evidence of pulmonic stenosis. Aorta: The aortic root is normal in size and structure and aortic dilatation noted. There is borderline dilatation of the ascending aorta, measuring 38 mm. Venous: The inferior vena  cava is dilated in size with greater than 50% respiratory variability, suggesting right atrial pressure of 8 mmHg.  Additional Comments: Technically difficult; normal LV function; enlarged right heart. There is a large pleural effusion.  LEFT VENTRICLE PLAX 2D LVIDd:         4.90 cm LVIDs:         3.20 cm LV PW:         1.60 cm LV IVS:         1.60 cm LVOT diam:     2.00 cm LV SV:         82 LV SV Index:   32 LVOT Area:     3.14 cm  LV Volumes (MOD) LV vol d, MOD A2C: 38.4 ml LV vol d, MOD A4C: 85.6 ml LV vol s, MOD A2C: 13.9 ml LV vol s, MOD A4C: 29.3 ml LV SV MOD A2C:     24.5 ml LV SV MOD A4C:     85.6 ml LV SV MOD BP:      44.9 ml IVC IVC diam: 4.50 cm LEFT ATRIUM           Index       RIGHT ATRIUM           Index LA diam:      4.20 cm 1.66 cm/m  RA Area:     34.50 cm LA Vol (A2C): 22.8 ml 9.02 ml/m  RA Volume:   129.00 ml 51.04 ml/m LA Vol (A4C): 80.3 ml 31.77 ml/m  AORTIC VALVE                    PULMONIC VALVE AV Area (Vmax):    2.28 cm     PR End Diast Vel: 1.77 msec AV Area (Vmean):   2.36 cm AV Area (VTI):     2.66 cm AV Vmax:           186.00 cm/s AV Vmean:          133.000 cm/s AV VTI:            0.307 m AV Peak Grad:      13.8 mmHg AV Mean Grad:      8.0 mmHg LVOT Vmax:         135.00 cm/s LVOT Vmean:        100.000 cm/s LVOT VTI:          0.260 m LVOT/AV VTI ratio: 0.85  AORTA Ao Root diam: 3.10 cm Ao Asc diam:  3.80 cm MITRAL VALVE                TRICUSPID VALVE MV Area (PHT): 5.05 cm     TR Peak grad:   17.3 mmHg MV Decel Time: 150 msec     TR Vmax:        208.00 cm/s MV E velocity: 149.33 cm/s                             SHUNTS                             Systemic VTI:  0.26 m                             Systemic Diam: 2.00 cm Kirk Ruths MD Electronically signed by Kirk Ruths MD Signature Date/Time:  03/20/2021/11:44:35 AM    Final    US Abdomen Limited RUQ (LIVER/GB)  Result Date: 03/09/2021 CLINICAL DATA:  Cirrhosis. EXAM: ULTRASOUND ABDOMEN LIMITED RIGHT UPPER QUADRANT COMPARISON:  01/10/2021 CTA chest.  Abdominal ultrasound 07/21/2020. FINDINGS: Gallbladder: Surgically absent Common bile duct: Diameter: Normal, 7 mm. Liver: Irregular hepatic capsule, consistent with cirrhosis. No focal liver lesion. Portal vein is patent on color Doppler imaging with normal direction of blood flow towards the liver. Other:  Trace perihepatic ascites. IMPRESSION: Cirrhosis, without hepatocellular carcinoma. Trace perihepatic ascites. Electronically Signed   By: Abigail Miyamoto M.D.   On: 03/09/2021 14:01      Discharge Exam: Vitals:   03/22/21 0456 03/22/21 0811  BP: (!) 104/49   Pulse: 90   Resp: 20   Temp: 98.1 F (36.7 C)   SpO2: 92% 93%    General: Pt is alert, awake, not in acute distress Cardiovascular: S1/S2 +, Trace edema Respiratory: CTA bilaterally, no wheezing, no rhonchi, no respiratory distress, no conversational dyspnea, on room air  Abdominal: Soft, NT, ND, bowel sounds + Extremities: no edema, no cyanosis, mild erythema of right forearm, improved  Psych: Normal mood and affect, stable judgement and insight     The results of significant diagnostics from this hospitalization (including imaging, microbiology, ancillary and laboratory) are listed below for reference.     Microbiology: Recent Results (from the past 240 hour(s))  Resp Panel by RT-PCR (Flu A&B, Covid) Nasopharyngeal Swab     Status: None   Collection Time: 03/17/21  9:32 PM   Specimen: Nasopharyngeal Swab; Nasopharyngeal(NP) swabs in vial transport medium  Result Value Ref Range Status   SARS Coronavirus 2 by RT PCR NEGATIVE NEGATIVE Final    Comment: (NOTE) SARS-CoV-2 target nucleic acids are NOT DETECTED.  The SARS-CoV-2 RNA is generally detectable in upper respiratory specimens during the acute phase of infection. The lowest concentration of SARS-CoV-2 viral copies this assay can detect is 138 copies/mL. A negative result does not preclude SARS-Cov-2 infection and should not be used as the sole basis for treatment or other patient management decisions. A negative result may occur with  improper specimen collection/handling, submission of specimen other than nasopharyngeal swab, presence of viral mutation(s) within the areas targeted by this assay, and inadequate number of viral copies(<138 copies/mL). A negative  result must be combined with clinical observations, patient history, and epidemiological information. The expected result is Negative.  Fact Sheet for Patients:  EntrepreneurPulse.com.au  Fact Sheet for Healthcare Providers:  IncredibleEmployment.be  This test is no t yet approved or cleared by the [place] FDA and  has been authorized for detection and/or diagnosis of SARS-CoV-2 by FDA under an Emergency Use Authorization (EUA). This EUA will remain  in effect (meaning this test can be used) for the duration of the COVID-19 declaration under Section 564(b)(1) of the Act, 21 U.S.C.section 360bbb-3(b)(1), unless the authorization is terminated  or revoked sooner.       Influenza A by PCR NEGATIVE NEGATIVE Final   Influenza B by PCR NEGATIVE NEGATIVE Final    Comment: (NOTE) The Xpert Xpress SARS-CoV-2/FLU/RSV plus assay is intended as an aid in the diagnosis of influenza from Nasopharyngeal swab specimens and should not be used as a sole basis for treatment. Nasal washings and aspirates are unacceptable for Xpert Xpress SARS-CoV-2/FLU/RSV testing.  Fact Sheet for Patients: EntrepreneurPulse.com.au  Fact Sheet for Healthcare Providers: IncredibleEmployment.be  This test is not yet approved or cleared by the [place] and has been authorized  for detection and/or diagnosis of SARS-CoV-2 by FDA under an Emergency Use Authorization (EUA). This EUA will remain in effect (meaning this test can be used) for the duration of the COVID-19 declaration under Section 564(b)(1) of the Act, 21 U.S.C. section 360bbb-3(b)(1), unless the authorization is terminated or revoked.  Performed at [facility], [place] [address] [place], [place] [address]   Culture, blood (routine x 2)     Status: None (Preliminary result)   Collection Time: 03/17/21  9:36 PM   Specimen: BLOOD  Result Value Ref  Range Status   Specimen Description   Final    BLOOD LEFT HAND Performed at [place] [address] [place], [place] [address]    Special Requests   Final    BOTTLES DRAWN AEROBIC ONLY Blood Culture results may not be optimal due to an inadequate volume of blood received in culture bottles Performed at [place] [place] [address] [place], [place] [address]    Culture   Final    NO GROWTH 4 DAYS Performed at [facility], [place] [address] [place], [place] [address]    Report Status PENDING  Incomplete  Urine culture     Status: Abnormal   Collection Time: 03/18/21  4:16 AM   Specimen: Urine, Clean Catch  Result Value Ref Range Status   Specimen Description   Final    URINE, CLEAN CATCH Performed at [facility], [place] [address], [place], [place] [address]    Special Requests   Final    NONE Performed at [facility], [place] [address], [place], [place] [address]    Culture MULTIPLE SPECIES PRESENT, SUGGEST RECOLLECTION (A)  Final   Report Status 03/19/2021 FINAL  Final  Culture, blood (routine x 2)     Status: None (Preliminary result)   Collection Time: 03/18/21  6:04 AM   Specimen: BLOOD LEFT HAND  Result Value Ref Range Status   Specimen Description   Final    BLOOD LEFT HAND Performed at [facility] [address] [place], [place] [address]    Special Requests   Final    BOTTLES DRAWN AEROBIC ONLY Blood Culture adequate volume Performed at [facility] [address], [place], [place] [address]    Culture   Final    NO GROWTH 4 DAYS Performed at [facility], [place] [address] [place], [place] [address]    Report Status PENDING  Incomplete     Labs: BNP (last 3 results) Recent Labs    01/10/21 0430 03/18/21 0413  BNP 359.8* 462.8*   Basic Metabolic Panel: Recent Labs  Lab 03/18/21 0414 03/19/21 0443 03/20/21 0600  03/21/21 0547 03/22/21 0659  NA 137 139 138 138 139  K 3.7 3.6 3.1* 3.3* 3.5  CL 102 102 99 99 99  CO2 28 30 30 30 29   GLUCOSE 152* 126* 112* 138* 119*  BUN 19 20 18 19 19   CREATININE 0.91 1.07* 0.89 0.89 0.83  CALCIUM 8.8* 8.6* 8.4* 8.9 9.0  MG  --   --   --  1.8  --    Liver Function Tests: Recent Labs  Lab 03/18/21 0011  AST 25  ALT 18  ALKPHOS 80  BILITOT 3.5*  PROT 7.4  ALBUMIN 3.4*   No results for input(s): LIPASE, AMYLASE in the last 168 hours. Recent Labs  Lab 03/17/21 2330  AMMONIA 28   CBC: Recent  Labs  Lab 03/18/21 0011 03/18/21 0414 03/19/21 0443 03/20/21 0600 03/21/21 0547 03/22/21 0659  WBC 7.0 8.9 4.2 3.5* 3.7* 3.6*  NEUTROABS 6.1  --   --   --   --   --   HGB 11.2* 10.4* 9.5* 9.5* 9.8* 10.2*  HCT 34.4* 32.0* 29.4* 29.9* 30.5* 30.8*  MCV 96.4 96.7 96.4 96.8 95.0 94.8  PLT 29* 29* 26* 31* 39* 42*   Cardiac Enzymes: No results for input(s): CKTOTAL, CKMB, CKMBINDEX, TROPONINI in the last 168 hours. BNP: Invalid input(s): POCBNP CBG: Recent Labs  Lab 03/21/21 0901 03/21/21 1117 03/21/21 1628 03/21/21 2043 03/22/21 0736  GLUCAP 149* 142* 139* 128* 121*   D-Dimer No results for input(s): DDIMER in the last 72 hours. Hgb A1c No results for input(s): HGBA1C in the last 72 hours. Lipid Profile No results for input(s): CHOL, HDL, LDLCALC, TRIG, CHOLHDL, LDLDIRECT in the last 72 hours. Thyroid function studies No results for input(s): TSH, T4TOTAL, T3FREE, THYROIDAB in the last 72 hours.  Invalid input(s): FREET3 Anemia work up No results for input(s): VITAMINB12, FOLATE, FERRITIN, TIBC, IRON, RETICCTPCT in the last 72 hours. Urinalysis    Component Value Date/Time   COLORURINE YELLOW 03/18/2021 0416   APPEARANCEUR HAZY (A) 03/18/2021 0416   LABSPEC 1.017 03/18/2021 0416   PHURINE 5.0 03/18/2021 0416   GLUCOSEU NEGATIVE 03/18/2021 0416   HGBUR MODERATE (A) 03/18/2021 0416   BILIRUBINUR NEGATIVE 03/18/2021 0416   KETONESUR  NEGATIVE 03/18/2021 0416   PROTEINUR 30 (A) 03/18/2021 0416   UROBILINOGEN 2.0 (H) 01/31/2015 0055   NITRITE NEGATIVE 03/18/2021 0416   LEUKOCYTESUR SMALL (A) 03/18/2021 0416   Sepsis Labs Invalid input(s): PROCALCITONIN,  WBC,  LACTICIDVEN Microbiology Recent Results (from the past 240 hour(s))  Resp Panel by RT-PCR (Flu A&B, Covid) Nasopharyngeal Swab     Status: None   Collection Time: 03/17/21  9:32 PM   Specimen: Nasopharyngeal Swab; Nasopharyngeal(NP) swabs in vial transport medium  Result Value Ref Range Status   SARS Coronavirus 2 by RT PCR NEGATIVE NEGATIVE Final    Comment: (NOTE) SARS-CoV-2 target nucleic acids are NOT DETECTED.  The SARS-CoV-2 RNA is generally detectable in upper respiratory specimens during the acute phase of infection. The lowest concentration of SARS-CoV-2 viral copies this assay can detect is 138 copies/mL. A negative result does not preclude SARS-Cov-2 infection and should not be used as the sole basis for treatment or other patient management decisions. A negative result may occur with  improper specimen collection/handling, submission of specimen other than nasopharyngeal swab, presence of viral mutation(s) within the areas targeted by this assay, and inadequate number of viral copies(<138 copies/mL). A negative result must be combined with clinical observations, patient history, and epidemiological information. The expected result is Negative.  Fact Sheet for Patients:  EntrepreneurPulse.com.au  Fact Sheet for Healthcare Providers:  IncredibleEmployment.be  This test is no t yet approved or cleared by the [place] FDA and  has been authorized for detection and/or diagnosis of SARS-CoV-2 by FDA under an Emergency Use Authorization (EUA). This EUA will remain  in effect (meaning this test can be used) for the duration of the COVID-19 declaration under Section 564(b)(1) of the Act, 21 U.S.C.section  360bbb-3(b)(1), unless the authorization is terminated  or revoked sooner.       Influenza A by PCR NEGATIVE NEGATIVE Final   Influenza B by PCR NEGATIVE NEGATIVE Final    Comment: (NOTE) The Xpert Xpress SARS-CoV-2/FLU/RSV plus assay is intended as an aid in  the diagnosis of influenza from Nasopharyngeal swab specimens and should not be used as a sole basis for treatment. Nasal washings and aspirates are unacceptable for Xpert Xpress SARS-CoV-2/FLU/RSV testing.  Fact Sheet for Patients: EntrepreneurPulse.com.au  Fact Sheet for Healthcare Providers: IncredibleEmployment.be  This test is not yet approved or cleared by the [place] FDA and has been authorized for detection and/or diagnosis of SARS-CoV-2 by FDA under an Emergency Use Authorization (EUA). This EUA will remain in effect (meaning this test can be used) for the duration of the COVID-19 declaration under Section 564(b)(1) of the Act, 21 U.S.C. section 360bbb-3(b)(1), unless the authorization is terminated or revoked.  Performed at [facility], [place] [address] [place], [place] [address]   Culture, blood (routine x 2)     Status: None (Preliminary result)   Collection Time: 03/17/21  9:36 PM   Specimen: BLOOD  Result Value Ref Range Status   Specimen Description   Final    BLOOD LEFT HAND Performed at [place] [address] [place], [place] [address]    Special Requests   Final    BOTTLES DRAWN AEROBIC ONLY Blood Culture results may not be optimal due to an inadequate volume of blood received in culture bottles Performed at [place] [address] [place], [place] [address]    Culture   Final    NO GROWTH 4 DAYS Performed at [facility], [place] [address], [place], [place] [address]    Report Status PENDING  Incomplete  Urine culture     Status: Abnormal   Collection Time: 03/18/21  4:16 AM    Specimen: Urine, Clean Catch  Result Value Ref Range Status   Specimen Description   Final    URINE, CLEAN CATCH Performed at [facility], [place] [address], [place], [place] [address]    Special Requests   Final    NONE Performed at [facility], [place] [address], [place], [place] [address]    Culture MULTIPLE SPECIES PRESENT, SUGGEST RECOLLECTION (A)  Final   Report Status 03/19/2021 FINAL  Final  Culture, blood (routine x 2)     Status: None (Preliminary result)   Collection Time: 03/18/21  6:04 AM   Specimen: BLOOD LEFT HAND  Result Value Ref Range Status   Specimen Description   Final    BLOOD LEFT HAND Performed at [place] [address] [place], [place] [address]    Special Requests   Final    BOTTLES DRAWN AEROBIC ONLY Blood Culture adequate volume Performed at [place] [address], [place], [place] [address]    Culture   Final    NO GROWTH 4 DAYS Performed at [facility], [place] [address], [place], [place] [address]    Report Status PENDING  Incomplete     Patient was seen and examined on the day of discharge and was found to be in stable condition. Time coordinating discharge: 25 minutes including assessment and coordination of care, as well as examination of the patient.   SIGNED:  Dessa Phi, DO [facility] 03/22/2021, 8:55 AM

## 2021-03-22 NOTE — TOC Transition Note (Signed)
Transition of Care [facility]) - CM/SW Discharge Note   Patient Details  Name: Hannah Khan MRN: [id number] Date of Birth: [birth year]  Transition of Care [facility]) CM/SW Contact:  Trish Mage, LCSW Phone Number: 03/22/2021, 9:04 AM   Clinical Narrative:   I was alerted by MD about patient who is stable for d/c today needing nebulizer machine for home.  Order seen and appreciated.  Contacted Lac[place]ia with [facility] who will arrange for delivery to patient's room.  No further needs identified.  TOC sign off.    Final next level of care: Home/Self Care Barriers to Discharge: No Barriers Identified   Patient Goals and CMS Choice        Discharge Placement                       Discharge Plan and Services                                     Social Determinants of Health (SDOH) Interventions     Readmission Risk Interventions No flowsheet data found.

## 2021-03-23 LAB — CULTURE, BLOOD (ROUTINE X 2)
Culture: NO GROWTH
Culture: NO GROWTH
Special Requests: ADEQUATE

## 2021-03-24 ENCOUNTER — Encounter: Payer: Self-pay | Admitting: Oncology

## 2021-03-29 ENCOUNTER — Other Ambulatory Visit: Payer: Self-pay

## 2021-03-29 ENCOUNTER — Inpatient Hospital Stay (HOSPITAL_COMMUNITY)
Admission: EM | Admit: 2021-03-29 | Discharge: 2021-04-04 | DRG: 291 | Disposition: A | Discharge status: Home Health | Payer: Medicare Other | Attending: Internal Medicine | Admitting: Internal Medicine

## 2021-03-29 ENCOUNTER — Emergency Department (HOSPITAL_COMMUNITY): Payer: Medicare Other

## 2021-03-29 ENCOUNTER — Telehealth: Payer: Self-pay | Admitting: Cardiovascular Disease

## 2021-03-29 ENCOUNTER — Encounter (HOSPITAL_COMMUNITY): Payer: Self-pay | Admitting: Internal Medicine

## 2021-03-29 DIAGNOSIS — E785 Hyperlipidemia, unspecified: Secondary | ICD-10-CM | POA: Diagnosis present

## 2021-03-29 DIAGNOSIS — I5033 Acute on chronic diastolic (congestive) heart failure: Secondary | ICD-10-CM | POA: Diagnosis present

## 2021-03-29 DIAGNOSIS — I110 Hypertensive heart disease with heart failure: Principal | ICD-10-CM | POA: Diagnosis present

## 2021-03-29 DIAGNOSIS — R0602 Shortness of breath: Secondary | ICD-10-CM | POA: Diagnosis not present

## 2021-03-29 DIAGNOSIS — Z886 Allergy status to analgesic agent status: Secondary | ICD-10-CM

## 2021-03-29 DIAGNOSIS — Z20822 Contact with and (suspected) exposure to covid-19: Secondary | ICD-10-CM | POA: Diagnosis present

## 2021-03-29 DIAGNOSIS — E119 Type 2 diabetes mellitus without complications: Secondary | ICD-10-CM | POA: Diagnosis present

## 2021-03-29 DIAGNOSIS — E876 Hypokalemia: Secondary | ICD-10-CM | POA: Diagnosis present

## 2021-03-29 DIAGNOSIS — Z91041 Radiographic dye allergy status: Secondary | ICD-10-CM

## 2021-03-29 DIAGNOSIS — K7460 Unspecified cirrhosis of liver: Secondary | ICD-10-CM | POA: Diagnosis present

## 2021-03-29 DIAGNOSIS — D6959 Other secondary thrombocytopenia: Secondary | ICD-10-CM | POA: Diagnosis present

## 2021-03-29 DIAGNOSIS — D696 Thrombocytopenia, unspecified: Secondary | ICD-10-CM

## 2021-03-29 DIAGNOSIS — Z91013 Allergy to seafood: Secondary | ICD-10-CM

## 2021-03-29 DIAGNOSIS — Z6841 Body Mass Index (BMI) 40.0 and over, adult: Secondary | ICD-10-CM

## 2021-03-29 DIAGNOSIS — Z8249 Family history of ischemic heart disease and other diseases of the circulatory system: Secondary | ICD-10-CM

## 2021-03-29 DIAGNOSIS — M797 Fibromyalgia: Secondary | ICD-10-CM | POA: Diagnosis present

## 2021-03-29 DIAGNOSIS — I248 Other forms of acute ischemic heart disease: Secondary | ICD-10-CM | POA: Diagnosis present

## 2021-03-29 DIAGNOSIS — I4819 Other persistent atrial fibrillation: Secondary | ICD-10-CM | POA: Diagnosis present

## 2021-03-29 DIAGNOSIS — I5043 Acute on chronic combined systolic (congestive) and diastolic (congestive) heart failure: Secondary | ICD-10-CM

## 2021-03-29 DIAGNOSIS — E662 Morbid (severe) obesity with alveolar hypoventilation: Secondary | ICD-10-CM | POA: Diagnosis present

## 2021-03-29 DIAGNOSIS — Z83438 Family history of other disorder of lipoprotein metabolism and other lipidemia: Secondary | ICD-10-CM

## 2021-03-29 DIAGNOSIS — I272 Pulmonary hypertension, unspecified: Secondary | ICD-10-CM | POA: Diagnosis present

## 2021-03-29 DIAGNOSIS — Y92230 Patient room in hospital as the place of occurrence of the external cause: Secondary | ICD-10-CM | POA: Diagnosis not present

## 2021-03-29 DIAGNOSIS — I5032 Chronic diastolic (congestive) heart failure: Secondary | ICD-10-CM | POA: Diagnosis present

## 2021-03-29 DIAGNOSIS — Z8701 Personal history of pneumonia (recurrent): Secondary | ICD-10-CM

## 2021-03-29 DIAGNOSIS — I10 Essential (primary) hypertension: Secondary | ICD-10-CM | POA: Diagnosis present

## 2021-03-29 DIAGNOSIS — Z87891 Personal history of nicotine dependence: Secondary | ICD-10-CM

## 2021-03-29 DIAGNOSIS — Z7984 Long term (current) use of oral hypoglycemic drugs: Secondary | ICD-10-CM

## 2021-03-29 DIAGNOSIS — Z885 Allergy status to narcotic agent status: Secondary | ICD-10-CM

## 2021-03-29 DIAGNOSIS — R55 Syncope and collapse: Secondary | ICD-10-CM | POA: Diagnosis not present

## 2021-03-29 DIAGNOSIS — T474X5A Adverse effect of other laxatives, initial encounter: Secondary | ICD-10-CM | POA: Diagnosis not present

## 2021-03-29 DIAGNOSIS — Z888 Allergy status to other drugs, medicaments and biological substances status: Secondary | ICD-10-CM

## 2021-03-29 DIAGNOSIS — E1169 Type 2 diabetes mellitus with other specified complication: Secondary | ICD-10-CM | POA: Diagnosis present

## 2021-03-29 DIAGNOSIS — K766 Portal hypertension: Secondary | ICD-10-CM | POA: Diagnosis present

## 2021-03-29 DIAGNOSIS — Z79899 Other long term (current) drug therapy: Secondary | ICD-10-CM

## 2021-03-29 DIAGNOSIS — I482 Chronic atrial fibrillation, unspecified: Secondary | ICD-10-CM

## 2021-03-29 DIAGNOSIS — M170 Bilateral primary osteoarthritis of knee: Secondary | ICD-10-CM | POA: Diagnosis present

## 2021-03-29 DIAGNOSIS — K7581 Nonalcoholic steatohepatitis (NASH): Secondary | ICD-10-CM | POA: Diagnosis present

## 2021-03-29 DIAGNOSIS — Z86010 Personal history of colonic polyps: Secondary | ICD-10-CM

## 2021-03-29 DIAGNOSIS — R0902 Hypoxemia: Secondary | ICD-10-CM | POA: Diagnosis present

## 2021-03-29 DIAGNOSIS — G4733 Obstructive sleep apnea (adult) (pediatric): Secondary | ICD-10-CM | POA: Diagnosis present

## 2021-03-29 DIAGNOSIS — Z9049 Acquired absence of other specified parts of digestive tract: Secondary | ICD-10-CM

## 2021-03-29 LAB — COMPREHENSIVE METABOLIC PANEL
ALT: 17 U/L (ref 0–44)
AST: 26 U/L (ref 15–41)
Albumin: 3.2 g/dL — ABNORMAL LOW (ref 3.5–5.0)
Alkaline Phosphatase: 78 U/L (ref 38–126)
Anion gap: 8 (ref 5–15)
BUN: 14 mg/dL (ref 8–23)
CO2: 28 mmol/L (ref 22–32)
Calcium: 9.1 mg/dL (ref 8.9–10.3)
Chloride: 101 mmol/L (ref 98–111)
Creatinine, Ser: 1.02 mg/dL — ABNORMAL HIGH (ref 0.44–1.00)
GFR, Estimated: >60 mL/min (ref >60)
Glucose, Bld: 132 mg/dL — ABNORMAL HIGH (ref 70–99)
Potassium: 3.5 mmol/L (ref 3.5–5.1)
Sodium: 137 mmol/L (ref 135–145)
Total Bilirubin: 4.4 mg/dL — ABNORMAL HIGH (ref 0.3–1.2)
Total Protein: 7.4 g/dL (ref 6.5–8.1)

## 2021-03-29 LAB — CBC
HCT: 36 % (ref 36.0–46.0)
Hemoglobin: 11.7 g/dL — ABNORMAL LOW (ref 12.0–15.0)
MCH: 31.4 pg (ref 26.0–34.0)
MCHC: 32.5 g/dL (ref 30.0–36.0)
MCV: 96.5 fL (ref 80.0–100.0)
RBC: 3.73 MIL/uL — ABNORMAL LOW (ref 3.87–5.11)
RDW: 15.9 % — ABNORMAL HIGH (ref 11.5–15.5)
WBC: 5.8 10*3/uL (ref 4.0–10.5)
nRBC: 0 % (ref 0.0–0.2)

## 2021-03-29 LAB — PROTIME-INR
INR: 1.2 (ref 0.8–1.2)
Prothrombin Time: 15.6 seconds — ABNORMAL HIGH (ref 11.4–15.2)

## 2021-03-29 LAB — BRAIN NATRIURETIC PEPTIDE: B Natriuretic Peptide: 326.1 pg/mL — ABNORMAL HIGH (ref 0.0–100.0)

## 2021-03-29 LAB — AMMONIA: Ammonia: 49 umol/L — ABNORMAL HIGH (ref 9–35)

## 2021-03-29 MED ORDER — FUROSEMIDE 10 MG/ML IJ SOLN
60.0000 mg | Freq: Once | INTRAMUSCULAR | Status: AC
  Administered 2021-03-29: 60 mg via INTRAVENOUS
  Filled 2021-03-29: qty 6

## 2021-03-29 MED ORDER — OXYCODONE HCL 5 MG PO TABS
5.0000 mg | ORAL_TABLET | ORAL | Status: DC | PRN
  Filled 2021-03-29: qty 1

## 2021-03-29 MED ORDER — POLYETHYLENE GLYCOL 3350 17 G PO PACK: 17.0000 g | PACK | Freq: Every day | ORAL | Status: DC | PRN

## 2021-03-29 MED ORDER — TRAZODONE HCL 50 MG PO TABS: 25.0000 mg | ORAL_TABLET | Freq: Every evening | ORAL | Status: DC | PRN

## 2021-03-29 MED ORDER — METOPROLOL TARTRATE 25 MG PO TABS
50.0000 mg | ORAL_TABLET | Freq: Two times a day (BID) | ORAL | Status: DC
  Administered 2021-03-29 – 2021-04-04 (×12): 50 mg via ORAL
  Filled 2021-03-29 (×12): qty 2

## 2021-03-29 MED ORDER — LACTULOSE 10 GM/15ML PO SOLN
20.0000 g | Freq: Three times a day (TID) | ORAL | Status: DC
  Administered 2021-03-29 – 2021-04-02 (×11): 20 g via ORAL
  Filled 2021-03-29 (×11): qty 30

## 2021-03-29 MED ORDER — SODIUM CHLORIDE 0.9% FLUSH
3.0000 mL | Freq: Two times a day (BID) | INTRAVENOUS | Status: DC
  Administered 2021-03-29 – 2021-04-04 (×12): 3 mL via INTRAVENOUS

## 2021-03-29 MED ORDER — ALBUTEROL SULFATE HFA 108 (90 BASE) MCG/ACT IN AERS
2.0000 | INHALATION_SPRAY | RESPIRATORY_TRACT | Status: DC | PRN
  Filled 2021-03-29: qty 6.7

## 2021-03-29 MED ORDER — ACETAMINOPHEN 325 MG PO TABS
650.0000 mg | ORAL_TABLET | Freq: Four times a day (QID) | ORAL | Status: DC | PRN
  Filled 2021-03-29: qty 2

## 2021-03-29 MED ORDER — BISACODYL 5 MG PO TBEC: 5.0000 mg | DELAYED_RELEASE_TABLET | Freq: Every day | ORAL | Status: DC | PRN

## 2021-03-29 MED ORDER — ONDANSETRON HCL 4 MG/2ML IJ SOLN: 4.0000 mg | Freq: Four times a day (QID) | INTRAMUSCULAR | Status: DC | PRN

## 2021-03-29 MED ORDER — DILTIAZEM HCL ER COATED BEADS 360 MG PO CP24
360.0000 mg | ORAL_CAPSULE | Freq: Every day | ORAL | Status: DC
  Administered 2021-03-30 – 2021-04-04 (×6): 360 mg via ORAL
  Filled 2021-03-29 (×2): qty 2
  Filled 2021-03-29: qty 1
  Filled 2021-03-29: qty 2
  Filled 2021-03-29: qty 1
  Filled 2021-03-29: qty 2
  Filled 2021-03-29 (×2): qty 1
  Filled 2021-03-29: qty 2
  Filled 2021-03-29: qty 1
  Filled 2021-03-29: qty 2
  Filled 2021-03-29: qty 1

## 2021-03-29 MED ORDER — POTASSIUM CHLORIDE CRYS ER 20 MEQ PO TBCR: 20.0000 meq | EXTENDED_RELEASE_TABLET | Freq: Every day | ORAL | Status: DC

## 2021-03-29 MED ORDER — HYDRALAZINE HCL 20 MG/ML IJ SOLN: 5.0000 mg | INTRAMUSCULAR | Status: DC | PRN

## 2021-03-29 MED ORDER — ACETAMINOPHEN 650 MG RE SUPP: 650.0000 mg | Freq: Four times a day (QID) | RECTAL | Status: DC | PRN

## 2021-03-29 MED ORDER — ENOXAPARIN SODIUM 80 MG/0.8ML IJ SOSY
80.0000 mg | PREFILLED_SYRINGE | INTRAMUSCULAR | Status: DC
  Filled 2021-03-29 (×2): qty 0.8

## 2021-03-29 MED ORDER — RIFAXIMIN 550 MG PO TABS
550.0000 mg | ORAL_TABLET | Freq: Two times a day (BID) | ORAL | Status: DC
  Administered 2021-03-29 – 2021-04-04 (×12): 550 mg via ORAL
  Filled 2021-03-29 (×13): qty 1

## 2021-03-29 MED ORDER — FUROSEMIDE 10 MG/ML IJ SOLN
60.0000 mg | Freq: Two times a day (BID) | INTRAMUSCULAR | Status: DC
  Administered 2021-03-29 – 2021-04-01 (×6): 60 mg via INTRAVENOUS
  Filled 2021-03-29 (×7): qty 6

## 2021-03-29 MED ORDER — DOCUSATE SODIUM 100 MG PO CAPS
100.0000 mg | ORAL_CAPSULE | Freq: Two times a day (BID) | ORAL | Status: DC
  Administered 2021-04-04: 100 mg via ORAL
  Filled 2021-03-29 (×7): qty 1

## 2021-03-29 MED ORDER — ONDANSETRON HCL 4 MG PO TABS: 4.0000 mg | ORAL_TABLET | Freq: Four times a day (QID) | ORAL | Status: DC | PRN

## 2021-03-29 MED ORDER — SPIRONOLACTONE 25 MG PO TABS
25.0000 mg | ORAL_TABLET | Freq: Every day | ORAL | Status: DC
  Administered 2021-03-30 – 2021-04-04 (×6): 25 mg via ORAL
  Filled 2021-03-29 (×6): qty 1

## 2021-03-29 NOTE — H&P (Signed)
History and Physical    Hannah Khan [id number] DOB: June 01, 1956 DOA: 03/29/2021  PCP: Lujean Amel, MD Consultants:  Oval Linsey - cardiology; Berenice Primas - orthopedics Patient coming from:  Home - lives with husband; NOK: Husband, [phone]  Chief Complaint: SOB  HPI: Hannah Khan is a 65 y.o. female with medical history significant of DM; afib not on AC due to chronic thrombocytopenia; HTN; chronic diastolic CHF; morbid obesity; OSA on CPAP; and NASH cirrhosis presenting with SOB.  She was last hospitalized from 6/29-7/4 for CAP, treated for 5 days with Rocephin/Azithromycin.  She was also treated for RUE cellulitis and acute on chronic diastolic CHF during that hospitalization.  Since d/c, she feels like she never quire recovered from the PNA.  Her SOB got worse and worse.  She was unable to elevate her feet since she had orthopnea.  Her O2 level dropped last night to 73% and then increased to the mid-80s.  This AM, it was about 92%.  Last night, she was unable to sleep despite multiple pillows.  She had a bad headache and was dizzy upon awakening.  She does not wear home O2.  She was on room air throughout her ER stay but it was just started on 1.5L O2.      ED Course: Recently hospitalized.  Back with increased SOB.  Completed abx.  No cough, fever.  Increased SOB, DOE, orthopnea, edema.  Mental status clear.  CXR c/w CHF.  Given Lasix.  Likely CHF exacerbation.  Review of Systems: As per HPI; otherwise review of systems reviewed and negative.   Ambulatory Status:  Ambulates with as walker  COVID Vaccine Status:  Complete plus booster  Past Medical History:  Diagnosis Date   Anemia    hx of   Arthritis    knees   Diabetes mellitus without complication (HCC)    type 2   Fibromyalgia    HTN (hypertension)    Hx of colonic polyps    s/p partial colectomy   Hyperlipidemia    Joint pain    in knees and back spasms   Left leg swelling    wear compression hose   Liver  cirrhosis secondary to NASH (nonalcoholic steatohepatitis) ([place]) dx nov 2014   had enlarged spleen also    Low blood pressure    bp varies   Morbid obesity ([place])    Persistent atrial fibrillation ([place])    Pneumonia 10/2016   PONV (postoperative nausea and vomiting)    in past none recent   Portal hypertension ([place])    Sleep apnea    uses cpap setting of 14   Spleen enlarged    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 [facility])     Past Surgical History:  Procedure Laterality Date   BREAST LUMPECTOMY WITH RADIOACTIVE SEED LOCALIZATION Right 08/29/2017   Procedure: RIGHT BREAST LUMPECTOMY WITH RADIOACTIVE SEEDS LOCALIZATION;  Surgeon: Erroll Luna, MD;  Location: [facility];  Service: General;  Laterality: Right;   [place]  20 yrs ago   COLECTOMY  2011   COLONOSCOPY     COLONOSCOPY N/A 09/05/2018   Procedure: COLONOSCOPY;  Surgeon: [name], MD;  Location: [facility];  Service: Endoscopy;  Laterality: N/A;   DILATATION & CURETTAGE/HYSTEROSCOPY WITH MYOSURE N/A 05/06/2016   Procedure: DILATATION & CURETTAGE/HYSTEROSCOPY WITH MYOSURE WITH POLYPECTOMY;  Surgeon: Janyth Pupa, DO;  Location: [facility];  Service: Gynecology;  Laterality: N/A;   ESOPHAGOGASTRODUODENOSCOPY (EGD) WITH PROPOFOL N/A 09/04/2013   Procedure:  ESOPHAGOGASTRODUODENOSCOPY (EGD) WITH PROPOFOL;  Surgeon: [name], MD;  Location: [facility];  Service: Endoscopy;  Laterality: N/A;   KNEE ARTHROSCOPY  yrs ago   bilateral, one done x 1, one done twice   POLYPECTOMY  09/05/2018   Procedure: POLYPECTOMY;  Surgeon: [name], MD;  Location: [facility];  Service: Endoscopy;;    Social History   Socioeconomic History   Marital status: Married    Spouse name: Not on file   Number of children: Not on file   Years of education: Not on file   Highest education level: Not on file  Occupational History   Occupation: Merchant navy officer  Tobacco Use   Smoking status: Former     Packs/day: 1.00    Years: 3.00    Pack years: 3.00    Types: Cigarettes    Quit date: 09/19/1974    Years since quitting: 46.5   Smokeless tobacco: Never  Vaping Use   Vaping Use: Never used  Substance and Sexual Activity   Alcohol use: Yes    Comment: occ   Drug use: No   Sexual activity: Not on file  Other Topics Concern   Not on file  Social History Narrative   Lives in [place] with her husband.  Instructor for early childhood development.     Social Determinants of Health   Financial Resource Strain: Not on file  Food Insecurity: Not on file  Transportation Needs: Not on file  Physical Activity: Not on file  Stress: Not on file  Social Connections: Not on file  Intimate Partner Violence: Not on file    Allergies  Allergen Reactions   Celecoxib Other (See Comments)    "speech slurred" with celebrex   Shellfish Allergy Swelling and Rash    TINGLING AND SWELLING OF LIPS. LARGE WHELPS QUARTER-SIZE   Barium-Containing Compounds Other (See Comments)    TACHYCARDIA   Prednisone Other (See Comments)    TACHYCARDIA   Statins Other (See Comments)    AGGRAVATED FIBROMYALGIA   Fentanyl     Hallucinations    Ibuprofen Other (See Comments)    UNSPECIFIED SPECIFIC REACTION >> "DUE TO PLATELETS"   Tramadol     Hallucination    Tylenol [Acetaminophen] Other (See Comments)    UNSPECIFIED SPECIFIC REACTION >> "DUE TO PLATELETS"   Zetia [Ezetimibe]     Headache    Family History  Problem Relation Age of Onset   Heart failure Mother    Cancer Mother    Hyperlipidemia Mother    Congestive Heart Failure Mother    Stroke Mother    Lung cancer Father    Cancer Father    Cancer Brother    Hyperlipidemia Brother    Stroke Other     Prior to Admission medications   Medication Sig Start Date End Date Taking? Authorizing Provider  albuterol (PROVENTIL) (2.5 MG/3ML) 0.083% nebulizer solution Take 3 mLs (2.5 mg total) by nebulization every 6 (six) hours as needed for  wheezing or shortness of breath. 03/22/21  Yes Dessa Phi, DO  bumetanide (BUMEX) 2 MG tablet Take 1 tablet (2 mg total) by mouth daily. 02/17/21  Yes Cleaver, Jossie Ng, NP  diltiazem (CARDIZEM CD) 360 MG 24 hr capsule Take 1 capsule (360 mg total) by mouth daily. 02/17/21  Yes Cleaver, Jossie Ng, NP  glimepiride (AMARYL) 1 MG tablet Take 1 mg by mouth daily with breakfast.   Yes [provider]  lactulose (CHRONULAC) 10 GM/15ML solution Take 30 mLs by  mouth 3 (three) times daily. 03/17/21  Yes [provider]  lidocaine (LIDODERM) 5 % Place 1 patch onto the skin daily. Remove & Discard patch within 12 hours or as directed by MD Patient taking differently: Place 1 patch onto the skin daily as needed (pain). Remove & Discard patch within 12 hours or as directed by MD 01/24/21  Yes Mesner, Corene Cornea, MD  metoprolol tartrate (LOPRESSOR) 50 MG tablet Take 1 tablet (50 mg total) by mouth 2 (two) times daily. 02/17/21  Yes Cleaver, Jossie Ng, NP  potassium chloride (MICRO-K) 10 MEQ CR capsule Take 2 capsules (20 mEq total) by mouth daily. 03/22/21 06/20/21 Yes Dessa Phi, DO  spironolactone (ALDACTONE) 25 MG tablet Take 1 tablet (25 mg total) by mouth daily. 02/17/21  Yes Cleaver, Jossie Ng, NP  XIFAXAN 550 MG TABS tablet Take 550 mg by mouth 2 (two) times daily. 03/13/21  Yes [provider]  glimepiride (AMARYL) 1 MG tablet Take 1 tablet (1 mg total) by mouth daily with lunch. 01/24/21 03/18/21  Mesner, Corene Cornea, MD  Lancets [facility] DELICA PLUS [id number]) MISC Apply topically 3 (three) times daily. 04/10/20   [provider]  [facility] VERIO test strip 1 each 3 (three) times daily. 04/13/20   [provider]    Physical Exam: Vitals:   03/29/21 1601 03/29/21 1645 03/29/21 1730 03/29/21 1745  BP: (!) 108/44 117/78  (!) 130/56  Pulse: 78 80  84  Resp: 20 20  16   Temp:   98.3 F (36.8 C) 98 F (36.7 C)  TempSrc:   Oral Oral  SpO2: 92% 98%  94%  Weight: (!) 165.2 kg     Height:  5' 4"  (1.626 m)        General:  Appears calm and comfortable and is in NAD, now on [place] O2 Eyes:  PERRL, EOMI, normal lids, iris ENT:  grossly normal hearing, lips & tongue, mmm Neck:  no LAD, masses or thyromegaly Cardiovascular:  RRR, distant due to body habitus. 2-3+ chronic-appearing LE edema.  Respiratory:   CTA bilaterally with no wheezes/rales/rhonchi.  Mildly increased respiratory effort, on [place] O2. Abdomen:  soft, NT, ND; limited by body habitus Back:   normal alignment, no CVAT Skin:  no rash or induration seen on limited exam other than chronic stasis dermatitis (mild) Musculoskeletal:  grossly normal tone BUE/BLE, good ROM, no bony abnormality Lower extremity:   Limited foot exam with no ulcerations.  2+ distal pulses. Psychiatric:  blunted mood and affect, speech fluent and appropriate, AOx3 Neurologic:  CN 2-12 grossly intact, moves all extremities in coordinated fashion    Radiological Exams on Admission: Independently reviewed - see discussion in A/P where applicable  DG Chest Port 1 View  Result Date: 03/29/2021 CLINICAL DATA:  Shortness of breath. EXAM: PORTABLE CHEST 1 VIEW COMPARISON:  03/17/2021 FINDINGS: 1345 hours. The cardio pericardial silhouette is enlarged. Vascular congestion. Right base better aerated but there is persistent left base collapse/consolidation with probable small left effusion. The visualized bony structures of the thorax show no acute abnormality. Telemetry leads overlie the chest. IMPRESSION: Cardiomegaly with vascular congestion. Persistent retrocardiac left base collapse/consolidation with small left effusion. Electronically Signed   By: Misty Stanley M.D.   On: 03/29/2021 14:18    EKG: Independently reviewed.  Afib with rate 56; RBBB and LAFB; nonspecific ST changes with no evidence of acute ischemia   Labs on Admission: I have personally reviewed the available labs and imaging studies at the time of the admission.  Pertinent labs:    Glucose 132 Albumin  3.2 NH4 49 Bili 4.4 Unremarkable CBC, platelets clumped (previously 42) INR 1.2 BNP 326.1 - stable   Assessment/Plan Principal Problem:   Acute on chronic diastolic CHF (congestive heart failure) (HCC) Active Problems:   Obstructive sleep apnea   Essential hypertension   Persistent atrial fibrillation (HCC)   Morbid obesity-   Type 2 diabetes mellitus with other specified complication (HCC)   Liver cirrhosis secondary to NASH (nonalcoholic steatohepatitis) (HCC)   Hyperlipidemia   Acute on chronic diastolic CHF -Patient with known h/o chronic diastolic CHF presenting with worsening SOB and orthopnea -Previously hospitalized for PNA with d/c 1 week ago -CXR consistent with vascular congestion -Mildly elevated BNP consistent with prior -With elevated BNP and abnl CXR, acute decompensated CHF seems probable as diagnosis -Will place in observation status with telemetry, as there are no current findings necessitating admission (hemodynamic instability, severe electrolyte abnormalities, cardiac arrhythmias, ACS, severe pulmonary edema requiring new O2 therapy, AMS) -7/2 echo was technically difficult with preserved EF and indeterminate diastolic parameters -CHF order set utilized -Was given Lasix 60 mg x 1 in ER and will repeat with 60 mg IV BID -Continue [place] O2 for now -Normal kidney function at this time, will follow -Repeat EKG in AM -Mildly elevated HS troponin is likely related to demand ischemia; doubt ACS based on symptoms -Consider addition of Bidil or Digoxin if still symptomatic despite guideline-directed therapies, as above -Treatment with SGLT-2-inhibitors reduces CHF-associated hospitalizations and should be considered -Ivabradine and IV iron have also been shown to reduce hospitalizations  HTN -Continue Cardizem -Consider ACE/ARB therapy -Continue Lopressor -Continue Aldactone, but holding Bumex -Will also add prn hydralazine  HLD -She  does not appear to be taking medications for this issue at this time   DM -Last A1c was 5.4, indicating good control -Hold Amaryl -Will cover with moderate-scale SSI for now  Afib -Rate controlled with Lopressor, Cardizem -No AC due to chronic thrombocytopenia  Chronic thrombocytopenia -Platelets were clumped today but were 42 on 7/4 -Hold Lovenox  OSA -Continue CPAP - may use home machine  NASH cirrhosis -Continue Rifaximin -Appears to be compensated at this time -Continue Lactulose  Morbid obesity -Body mass index is 62.51 kg/m..  -Weight loss should be encouraged -Outpatient PCP/bariatric medicine/bariatric surgery f/u encouraged  -OHS is a distinct consideration and she may require home O2      Note: This patient has been tested and is pending for the novel coronavirus COVID-19. The patient has been fully vaccinated against COVID-19.   Level of care: Telemetry Cardiac DVT prophylaxis: not currently due to thrombocytopenia - can add Lovenox if platelets >50 Code Status:  Full - confirmed with patient/family Family Communication: Husband was present throughout evaluation Disposition Plan:  The patient is from: home  Anticipated d/c is to: home, possibly with [facility] services  Anticipated d/c date will depend on clinical response to treatment, possibly as early as tomorrow if she has a good response to treatment  Patient is currently: acutely ill Consults called: TOC team; PT/OT; Nutrition Admission status:  It is my clinical opinion that referral for OBSERVATION is reasonable and necessary in this patient based on the above information provided. The aforementioned taken together are felt to place the patient at high risk for further clinical deterioration. However it is anticipated that the patient may be medically stable for discharge from the hospital within 24 to 48 hours.     Karmen Bongo MD [facility]  How to contact the [facility] Attending or Consulting  provider [place] or covering provider during after hours [place], for this patient?  Check the care team in [facility] and look for a) attending/consulting [facility] provider listed and b) the [facility] team listed Log into [email or web address] and use [place]'s universal password to access. If you do not have the password, please contact the hospital operator. Locate the [facility] provider you are looking for under [facility] and page to a number that you can be directly reached. If you still have difficulty reaching the provider, please page the [facility] (Director on Call) for the Hospitalists listed on amion for assistance.   03/29/2021, 6:14 PM

## 2021-03-29 NOTE — Telephone Encounter (Signed)
Pt c/o swelling: STAT is pt has developed SOB within 24 hours  If swelling, where is the swelling located? Legs and feet  How much weight have you gained and in what time span? She lost weight  Have you gained 3 pounds in a day or 5 pounds in a week?   Do you have a log of your daily weights (if so, list)?   Are you currently taking a fluid pill? Yes  Are you currently SOB? Yes  Have you traveled recently? No  Pt c/o Shortness Of Breath: STAT if SOB developed within the last 24 hours or pt is noticeably SOB on the phone  1. Are you currently SOB (can you hear that pt is SOB on the phone)? Husband called in for her but yes   2. How long have you been experiencing SOB? Since the week of the 4th of July   3. Are you SOB when sitting or when up moving around? Both  4. Are you currently experiencing any other symptoms? Afib

## 2021-03-29 NOTE — ED Triage Notes (Signed)
BIB by [facility] after pt called to report increase SHOB and dizziness. Per EMS, pt was d/c from [place] on 03/22/2021 with a dx of pneumonia. Pt reports feeling shob of breath since d/c.  Hx : COPD, CHF , chronic afib, low platelets

## 2021-03-29 NOTE — Telephone Encounter (Signed)
Spoke to patient's husband and patient . Patient is in the background able to talk and hear conversation.  Husband states patient oxygen  saturation are ranging 83 to 89 %. Patient recently discharge from hospital. Shortness of breath has not improve. Patient had to sleep with 3 pillows  and try to elevate her feet.  Both states she did not sleep well last just for few hours and is just now getting up - patient has not taken any medication today.   Patient used nebulizer @ 2 am this morning. She states a new prescription was sent by  primary- ( having issue filling medication.)  Per husband ,patient has had a an appointment with primary but breathing was not this bad since being discharge.. RN asked patient to weight her self now  and tell RN the weigh today and yesterday. -- today's 364.4 lb. Yesterday 360.0 lb. Patient is audibly short of breath while talking and moving around over the phone.  RN  instructed patient and husband to call 911/EMS and go to ER to be evaluated. Patient  and husband verbalized understanding -will go th [place].

## 2021-03-29 NOTE — ED Provider Notes (Signed)
[facility] EMERGENCY DEPARTMENT Provider Note   CSN: [id number] Arrival date & time: 03/29/21  1254     History Chief Complaint  Patient presents with   Shortness of Breath    Hannah Khan is a 65 y.o. female.  Patient with hx nash cirrhosis, chf, pulmonary htn, afib, dm, presents c/o increased sob in past few days. Symptoms acute onset, moderate, persistent, worsening. Notes recent admission for same/CHF  and pna - states feels very similar. +worsening orthopnea. No chest pain. +bilateral leg swelling. Denies new or worsening cough. No fever or chills. States compliant w home meds including lactulose and diuretic - feels is making normal amount urine. Completed abx for pna.   The history is provided by the patient and the EMS personnel.  Shortness of Breath Associated symptoms: no abdominal pain, no cough, no fever, no headaches, no neck pain, no rash, no sore throat and no vomiting       Past Medical History:  Diagnosis Date   Anemia    hx of   Arthritis    knees   Diabetes mellitus without complication (HCC)    type 2   Fibromyalgia    HTN (hypertension)    Hx of colonic polyps    s/p partial colectomy   Hyperlipidemia    Joint pain    in knees and back spasms   Left leg swelling    wear compression hose   Liver cirrhosis secondary to NASH (nonalcoholic steatohepatitis) ([place]) dx nov 2014   had enlarged spleen also    Low blood pressure    bp varies   Morbid obesity (HCC)    Persistent atrial fibrillation ([place])    Pneumonia 10/2016   PONV (postoperative nausea and vomiting)    in past none recent   Portal hypertension ([place])    Sleep apnea    uses cpap setting of 14   Spleen enlarged    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 [facility])     Patient Active Problem List   Diagnosis Date Noted   CAP (community acquired pneumonia) 03/18/2021   Atrial fibrillation with RVR ([place]) 01/10/2021   Acute encephalopathy 01/10/2021   Secondary  hypercoagulable state ([place]) 06/23/2020   Streptococcal bacteremia 03/16/2020   Chronic venous stasis dermatitis of both lower extremities 03/16/2020   A-fib ([place]) 03/15/2020   Acute lower UTI 04/26/2018   Liver cirrhosis secondary to NASH (nonalcoholic steatohepatitis) ([place]) 04/26/2018   Hyperlipidemia 04/26/2018   Type 2 diabetes mellitus with other specified complication ([place])    Cellulitis of right lower extremity 03/15/2018   Severe sepsis ([place]) 03/14/2018   Acute respiratory failure with hypoxia ([place]) 11/01/2016   Community acquired pneumonia of right lower lobe of lung 10/31/2016   Muscular abdominal pain in right upper quadrant 07/11/2016   Acute on chronic diastolic CHF (congestive heart failure) ([place]) 07/10/2016   Thrombocytopenia ([place]) 07/10/2016   Permanent atrial fibrillation ([place]) 01/30/2016   Type 2 diabetes mellitus without complication, without long-term current use of insulin ([place]) 01/30/2016   Acute on chronic combined systolic and diastolic CHF (congestive heart failure) ([place]) 01/26/2016   Morbid obesity- 12/10/2015   Persistent atrial fibrillation ([place]) 12/08/2015   Melena 01/31/2015   Aortic heart murmur 01/31/2015   Pancytopenia ([place]) 07/23/2013   Obstructive sleep apnea 08/07/2007   Essential hypertension 08/07/2007   ALLERGIC RHINITIS 08/07/2007    Past Surgical History:  Procedure Laterality Date   BREAST LUMPECTOMY WITH RADIOACTIVE SEED LOCALIZATION Right 08/29/2017   Procedure: RIGHT  BREAST LUMPECTOMY WITH RADIOACTIVE SEEDS LOCALIZATION;  Surgeon: Erroll Luna, MD;  Location: [place];  Service: General;  Laterality: Right;   [place]  20 yrs ago   COLECTOMY  2011   COLONOSCOPY     COLONOSCOPY N/A 09/05/2018   Procedure: COLONOSCOPY;  Surgeon: [name], MD;  Location: [facility];  Service: Endoscopy;  Laterality: N/A;   DILATATION & CURETTAGE/HYSTEROSCOPY WITH MYOSURE N/A 05/06/2016   Procedure: DILATATION &  CURETTAGE/HYSTEROSCOPY WITH MYOSURE WITH POLYPECTOMY;  Surgeon: Janyth Pupa, DO;  Location: [facility];  Service: Gynecology;  Laterality: N/A;   ESOPHAGOGASTRODUODENOSCOPY (EGD) WITH PROPOFOL N/A 09/04/2013   Procedure: ESOPHAGOGASTRODUODENOSCOPY (EGD) WITH PROPOFOL;  Surgeon: [name], MD;  Location: [facility];  Service: Endoscopy;  Laterality: N/A;   KNEE ARTHROSCOPY  yrs ago   bilateral, one done x 1, one done twice   POLYPECTOMY  09/05/2018   Procedure: POLYPECTOMY;  Surgeon: [name], MD;  Location: [facility];  Service: Endoscopy;;     OB History   No obstetric history on file.     Family History  Problem Relation Age of Onset   Heart failure Mother    Cancer Mother    Hyperlipidemia Mother    Congestive Heart Failure Mother    Stroke Mother    Lung cancer Father    Cancer Father    Cancer Brother    Hyperlipidemia Brother    Stroke Other     Social History   Tobacco Use   Smoking status: Former    Packs/day: 1.00    Years: 3.00    Pack years: 3.00    Types: Cigarettes    Quit date: 09/19/1974    Years since quitting: 46.5   Smokeless tobacco: Never  Vaping Use   Vaping Use: Never used  Substance Use Topics   Alcohol use: Yes    Comment: occ   Drug use: No    Home Medications Prior to Admission medications   Medication Sig Start Date End Date Taking? Authorizing Provider  albuterol (PROVENTIL) (2.5 MG/3ML) 0.083% nebulizer solution Take 3 mLs (2.5 mg total) by nebulization every 6 (six) hours as needed for wheezing or shortness of breath. 03/22/21   Dessa Phi, DO  bumetanide (BUMEX) 2 MG tablet Take 1 tablet (2 mg total) by mouth daily. 02/17/21   Deberah Pelton, NP  diltiazem (CARDIZEM CD) 360 MG 24 hr capsule Take 1 capsule (360 mg total) by mouth daily. 02/17/21   Deberah Pelton, NP  glimepiride (AMARYL) 1 MG tablet Take 1 tablet (1 mg total) by mouth daily with lunch. 01/24/21 03/18/21  Mesner, Corene Cornea, MD  lactulose (CHRONULAC) 10 GM/15ML  solution Take 30 mLs by mouth 3 (three) times daily. 03/17/21   [provider]  Lancets (ONETOUCH DELICA PLUS [id number]) MISC Apply topically 3 (three) times daily. 04/10/20   [provider]  lidocaine (LIDODERM) 5 % Place 1 patch onto the skin daily. Remove & Discard patch within 12 hours or as directed by MD 01/24/21   Mesner, Corene Cornea, MD  metoprolol tartrate (LOPRESSOR) 50 MG tablet Take 1 tablet (50 mg total) by mouth 2 (two) times daily. 02/17/21   Deberah Pelton, NP  ONETOUCH VERIO test strip 1 each 3 (three) times daily. 04/13/20   [provider]  potassium chloride (MICRO-K) 10 MEQ CR capsule Take 2 capsules (20 mEq total) by mouth daily. 03/22/21 06/20/21  Dessa Phi, DO  spironolactone (ALDACTONE) 25 MG tablet Take 1  tablet (25 mg total) by mouth daily. 02/17/21   Deberah Pelton, NP  XIFAXAN 550 MG TABS tablet Take 550 mg by mouth 2 (two) times daily. 03/13/21   [provider]    Allergies    Celecoxib, Shellfish allergy, Barium-containing compounds, Prednisone, Statins, Fentanyl, Ibuprofen, Tramadol, and Tylenol [acetaminophen]  Review of Systems   Review of Systems  Constitutional:  Negative for chills and fever.  HENT:  Negative for sore throat.   Eyes:  Negative for redness.  Respiratory:  Positive for shortness of breath. Negative for cough.   Cardiovascular:  Positive for leg swelling.  Gastrointestinal:  Negative for abdominal pain and vomiting.  Genitourinary:  Negative for flank pain.  Musculoskeletal:  Negative for back pain and neck pain.  Skin:  Negative for rash.  Neurological:  Negative for headaches.  Hematological:  Does not bruise/bleed easily.  Psychiatric/Behavioral:  Negative for confusion.    Physical Exam Updated Vital Signs Pulse 68   Resp (!) 26   SpO2 97%   Physical Exam Vitals and nursing note reviewed.  Constitutional:      Appearance: Normal appearance. She is well-developed.  HENT:     Head: Atraumatic.      Nose: Nose normal.     Mouth/Throat:     Mouth: Mucous membranes are moist.  Eyes:     General: No scleral icterus.    Conjunctiva/sclera: Conjunctivae normal.  Neck:     Trachea: No tracheal deviation.  Cardiovascular:     Rate and Rhythm: Normal rate and regular rhythm.     Pulses: Normal pulses.     Heart sounds: Normal heart sounds. No murmur heard.   No friction rub. No gallop.  Pulmonary:     Effort: Pulmonary effort is normal. No respiratory distress.     Breath sounds: Rales present.     Comments: Decreased bs and rales, bases.  Abdominal:     General: Bowel sounds are normal. There is no distension.     Palpations: Abdomen is soft.     Tenderness: There is no abdominal tenderness. There is no guarding.     Comments: Obese.   Genitourinary:    Comments: No cva tenderness.  Musculoskeletal:        General: Swelling present.     Cervical back: Normal range of motion and neck supple. No rigidity. No muscular tenderness.     Right lower leg: Edema present.     Left lower leg: Edema present.     Comments: Symmetric bilateral leg edema.   Skin:    General: Skin is warm and dry.     Findings: No rash.  Neurological:     Mental Status: She is alert.     Comments: Alert, speech normal.   Psychiatric:        Mood and Affect: Mood normal.    ED Results / Procedures / Treatments   Labs (all labs ordered are listed, but only abnormal results are displayed) Results for orders placed or performed during the hospital encounter of 03/29/21  CBC  Result Value Ref Range   WBC 5.8 4.0 - 10.5 K/uL   RBC 3.73 (L) 3.87 - 5.11 MIL/uL   Hemoglobin 11.7 (L) 12.0 - 15.0 g/dL   HCT 36.0 36.0 - 46.0 %   MCV 96.5 80.0 - 100.0 fL   MCH 31.4 26.0 - 34.0 pg   MCHC 32.5 30.0 - 36.0 g/dL   RDW 15.9 (H) 11.5 - 15.5 %   Platelets  DCLMP 150 - 400 K/uL   nRBC 0.0 0.0 - 0.2 %  Comprehensive metabolic panel  Result Value Ref Range   Sodium 137 135 - 145 mmol/L   Potassium 3.5 3.5 - 5.1  mmol/L   Chloride 101 98 - 111 mmol/L   CO2 28 22 - 32 mmol/L   Glucose, Bld 132 (H) 70 - 99 mg/dL   BUN 14 8 - 23 mg/dL   Creatinine, Ser 1.02 (H) 0.44 - 1.00 mg/dL   Calcium 9.1 8.9 - 10.3 mg/dL   Total Protein 7.4 6.5 - 8.1 g/dL   Albumin 3.2 (L) 3.5 - 5.0 g/dL   AST 26 15 - 41 U/L   ALT 17 0 - 44 U/L   Alkaline Phosphatase 78 38 - 126 U/L   Total Bilirubin 4.4 (H) 0.3 - 1.2 mg/dL   GFR, Estimated >60 >60 mL/min   Anion gap 8 5 - 15  Protime-INR  Result Value Ref Range   Prothrombin Time 15.6 (H) 11.4 - 15.2 seconds   INR 1.2 0.8 - 1.2  Ammonia  Result Value Ref Range   Ammonia 49 (H) 9 - 35 umol/L   DG Chest Portable 1 View  Result Date: 03/17/2021 CLINICAL DATA:  Hypoxia short of breath EXAM: PORTABLE CHEST 1 VIEW COMPARISON:  01/16/2021, CT 01/10/2021 FINDINGS: Cardiomegaly with vascular congestion and edema. Suspect moderate bilateral pleural effusions, likely contributing to asymmetric hazy opacity on the right. Airspace disease at the bases. IMPRESSION: Cardiomegaly with vascular congestion, bilateral effusions and edema. Basilar consolidations may reflect atelectasis or pneumonia Electronically Signed   By: Donavan Foil M.D.   On: 03/17/2021 21:55   ECHOCARDIOGRAM COMPLETE  Result Date: 03/20/2021    ECHOCARDIOGRAM REPORT   Patient Name:   Hannah Khan Date of Exam: 03/20/2021 Medical Rec #:  [id number]       Height:       64.0 in Accession #:    [id number]      Weight:       365.3 lb Date of Birth:  [birth year]       BSA:          2.527 m Patient Age:    65 years        BP:           112/70 mmHg Patient Gender: F               HR:           105 bpm. Exam Location:  Inpatient Procedure: 2D Echo, Cardiac Doppler, Color Doppler and Intracardiac            Opacification Agent Indications:    I50.40* Unspecified combined systolic (congestive) and diastolic                 (congestive) heart failure  History:        Patient has prior history of Echocardiogram examinations, most                  recent 03/17/2020. Abnormal ECG, Arrythmias:Atrial Fibrillation,                 Signs/Symptoms:Dyspnea, Shortness of Breath, Murmur and Fever;                 Risk Factors:Sleep Apnea, Hypertension and Diabetes.  Sonographer:    Roseanna Rainbow RDCS Referring Phys: [id number] [facility]  Sonographer Comments: Technically difficult study due to poor echo windows, patient is morbidly obese, suboptimal  parasternal window, suboptimal apical window and suboptimal subcostal window. Image acquisition challenging due to patient body habitus. Very difficult study. Only off axis images. IMPRESSIONS  1. Technically difficult; normal LV function; enlarged right heart.  2. Left ventricular ejection fraction, by estimation, is 60 to 65%. The left ventricle has normal function. The left ventricle has no regional wall motion abnormalities. There is mild left ventricular hypertrophy. Left ventricular diastolic parameters are indeterminate.  3. Right ventricular systolic function is normal. The right ventricular size is moderately enlarged. There is normal pulmonary artery systolic pressure.  4. Left atrial size was mildly dilated.  5. Right atrial size was severely dilated.  6. Large pleural effusion.  7. The mitral valve is normal in structure. Trivial mitral valve regurgitation. No evidence of mitral stenosis.  8. The aortic valve is calcified. Aortic valve regurgitation is not visualized. No aortic stenosis is present.  9. Aortic dilatation noted. There is borderline dilatation of the ascending aorta, measuring 38 mm. 10. The inferior vena cava is dilated in size with >50% respiratory variability, suggesting right atrial pressure of 8 mmHg. FINDINGS  Left Ventricle: Left ventricular ejection fraction, by estimation, is 60 to 65%. The left ventricle has normal function. The left ventricle has no regional wall motion abnormalities. Definity contrast agent was given IV to delineate the left ventricular  endocardial borders.  The left ventricular internal cavity size was normal in size. There is mild left ventricular hypertrophy. Left ventricular diastolic parameters are indeterminate. Right Ventricle: The right ventricular size is moderately enlarged. Right ventricular systolic function is normal. There is normal pulmonary artery systolic pressure. The tricuspid regurgitant velocity is 2.08 m/s, and with an assumed right atrial pressure of 8 mmHg, the estimated right ventricular systolic pressure is 51.8 mmHg. Left Atrium: Left atrial size was mildly dilated. Right Atrium: Right atrial size was severely dilated. Pericardium: Trivial pericardial effusion is present. Mitral Valve: The mitral valve is normal in structure. Trivial mitral valve regurgitation. No evidence of mitral valve stenosis. Tricuspid Valve: The tricuspid valve is normal in structure. Tricuspid valve regurgitation is mild . No evidence of tricuspid stenosis. Aortic Valve: The aortic valve is calcified. Aortic valve regurgitation is not visualized. No aortic stenosis is present. Aortic valve mean gradient measures 8.0 mmHg. Aortic valve peak gradient measures 13.8 mmHg. Aortic valve area, by VTI measures 2.66  cm. Pulmonic Valve: The pulmonic valve was not well visualized. Pulmonic valve regurgitation is trivial. No evidence of pulmonic stenosis. Aorta: The aortic root is normal in size and structure and aortic dilatation noted. There is borderline dilatation of the ascending aorta, measuring 38 mm. Venous: The inferior vena cava is dilated in size with greater than 50% respiratory variability, suggesting right atrial pressure of 8 mmHg.  Additional Comments: Technically difficult; normal LV function; enlarged right heart. There is a large pleural effusion.  LEFT VENTRICLE PLAX 2D LVIDd:         4.90 cm LVIDs:         3.20 cm LV PW:         1.60 cm LV IVS:        1.60 cm LVOT diam:     2.00 cm LV SV:         82 LV SV Index:   32 LVOT Area:     3.14 cm  LV Volumes (MOD)  LV vol d, MOD A2C: 38.4 ml LV vol d, MOD A4C: 85.6 ml LV vol s, MOD A2C: 13.9 ml LV vol s,  MOD A4C: 29.3 ml LV SV MOD A2C:     24.5 ml LV SV MOD A4C:     85.6 ml LV SV MOD BP:      44.9 ml IVC IVC diam: 4.50 cm LEFT ATRIUM           Index       RIGHT ATRIUM           Index LA diam:      4.20 cm 1.66 cm/m  RA Area:     34.50 cm LA Vol (A2C): 22.8 ml 9.02 ml/m  RA Volume:   129.00 ml 51.04 ml/m LA Vol (A4C): 80.3 ml 31.77 ml/m  AORTIC VALVE                    PULMONIC VALVE AV Area (Vmax):    2.28 cm     PR End Diast Vel: 1.77 msec AV Area (Vmean):   2.36 cm AV Area (VTI):     2.66 cm AV Vmax:           186.00 cm/s AV Vmean:          133.000 cm/s AV VTI:            0.307 m AV Peak Grad:      13.8 mmHg AV Mean Grad:      8.0 mmHg LVOT Vmax:         135.00 cm/s LVOT Vmean:        100.000 cm/s LVOT VTI:          0.260 m LVOT/AV VTI ratio: 0.85  AORTA Ao Root diam: 3.10 cm Ao Asc diam:  3.80 cm MITRAL VALVE                TRICUSPID VALVE MV Area (PHT): 5.05 cm     TR Peak grad:   17.3 mmHg MV Decel Time: 150 msec     TR Vmax:        208.00 cm/s MV E velocity: 149.33 cm/s                             SHUNTS                             Systemic VTI:  0.26 m                             Systemic Diam: 2.00 cm Kirk Ruths MD Electronically signed by Kirk Ruths MD Signature Date/Time: 03/20/2021/11:44:35 AM    Final    US Abdomen Limited RUQ (LIVER/GB)  Result Date: 03/09/2021 CLINICAL DATA:  Cirrhosis. EXAM: ULTRASOUND ABDOMEN LIMITED RIGHT UPPER QUADRANT COMPARISON:  01/10/2021 CTA chest.  Abdominal ultrasound 07/21/2020. FINDINGS: Gallbladder: Surgically absent Common bile duct: Diameter: Normal, 7 mm. Liver: Irregular hepatic capsule, consistent with cirrhosis. No focal liver lesion. Portal vein is patent on color Doppler imaging with normal direction of blood flow towards the liver. Other: Trace perihepatic ascites. IMPRESSION: Cirrhosis, without hepatocellular carcinoma. Trace perihepatic ascites.  Electronically Signed   By: Abigail Miyamoto M.D.   On: 03/09/2021 14:01    EKG EKG Interpretation  Date/Time:  Monday March 29 2021 13:03:24 EDT Ventricular Rate:  56 PR Interval:    QRS Duration: 106 QT Interval:  385 QTC Calculation: 372 R Axis:   -33 Text Interpretation: Atrial fibrillation  Incomplete RBBB and LAFB Low voltage, precordial leads Nonspecific T wave abnormality Confirmed by Lajean Saver [phone]) on 03/29/2021 1:44:20 PM  Radiology DG Chest Port 1 View  Result Date: 03/29/2021 CLINICAL DATA:  Shortness of breath. EXAM: PORTABLE CHEST 1 VIEW COMPARISON:  03/17/2021 FINDINGS: 1345 hours. The cardio pericardial silhouette is enlarged. Vascular congestion. Right base better aerated but there is persistent left base collapse/consolidation with probable small left effusion. The visualized bony structures of the thorax show no acute abnormality. Telemetry leads overlie the chest. IMPRESSION: Cardiomegaly with vascular congestion. Persistent retrocardiac left base collapse/consolidation with small left effusion. Electronically Signed   By: Misty Stanley M.D.   On: 03/29/2021 14:18    Procedures Procedures   Medications Ordered in ED Medications  albuterol (VENTOLIN HFA) 108 (90 Base) MCG/ACT inhaler 2 puff (has no administration in time range)    ED Course  I have reviewed the triage vital signs and the nursing notes.  Pertinent labs & imaging results that were available during my care of the patient were reviewed by me and considered in my medical decision making (see chart for details).    MDM Rules/Calculators/A&P                         Iv ns. Continuous pulse ox and cardiac monitoring. Stat labs. Ecg. Pcxr.   Reviewed nursing notes and prior charts for additional history.   Labs reviewed/interpreted by  me - chem/k normal.   CXR reviewed/interpreted by me - vascular congestion/chf.  Lasix iv.   Given increased dyspnea, chf, will admit. Hospitalists consulted for  admission.       Final Clinical Impression(s) / ED Diagnoses Final diagnoses:  None    Rx / DC Orders ED Discharge Orders     None        Lajean Saver, MD 03/29/21 1435

## 2021-03-30 ENCOUNTER — Other Ambulatory Visit (HOSPITAL_COMMUNITY): Payer: Self-pay

## 2021-03-30 ENCOUNTER — Encounter: Payer: Self-pay | Admitting: Oncology

## 2021-03-30 DIAGNOSIS — I110 Hypertensive heart disease with heart failure: Secondary | ICD-10-CM | POA: Diagnosis present

## 2021-03-30 DIAGNOSIS — Z91041 Radiographic dye allergy status: Secondary | ICD-10-CM | POA: Diagnosis not present

## 2021-03-30 DIAGNOSIS — K7581 Nonalcoholic steatohepatitis (NASH): Secondary | ICD-10-CM | POA: Diagnosis present

## 2021-03-30 DIAGNOSIS — Z91013 Allergy to seafood: Secondary | ICD-10-CM | POA: Diagnosis not present

## 2021-03-30 DIAGNOSIS — R0602 Shortness of breath: Secondary | ICD-10-CM | POA: Diagnosis present

## 2021-03-30 DIAGNOSIS — E876 Hypokalemia: Secondary | ICD-10-CM | POA: Diagnosis present

## 2021-03-30 DIAGNOSIS — R55 Syncope and collapse: Secondary | ICD-10-CM | POA: Diagnosis not present

## 2021-03-30 DIAGNOSIS — T474X5A Adverse effect of other laxatives, initial encounter: Secondary | ICD-10-CM | POA: Diagnosis not present

## 2021-03-30 DIAGNOSIS — I272 Pulmonary hypertension, unspecified: Secondary | ICD-10-CM | POA: Diagnosis present

## 2021-03-30 DIAGNOSIS — E785 Hyperlipidemia, unspecified: Secondary | ICD-10-CM | POA: Diagnosis present

## 2021-03-30 DIAGNOSIS — E1169 Type 2 diabetes mellitus with other specified complication: Secondary | ICD-10-CM | POA: Diagnosis present

## 2021-03-30 DIAGNOSIS — Z885 Allergy status to narcotic agent status: Secondary | ICD-10-CM | POA: Diagnosis not present

## 2021-03-30 DIAGNOSIS — Y92230 Patient room in hospital as the place of occurrence of the external cause: Secondary | ICD-10-CM | POA: Diagnosis not present

## 2021-03-30 DIAGNOSIS — Z886 Allergy status to analgesic agent status: Secondary | ICD-10-CM | POA: Diagnosis not present

## 2021-03-30 DIAGNOSIS — R0902 Hypoxemia: Secondary | ICD-10-CM | POA: Diagnosis present

## 2021-03-30 DIAGNOSIS — I5033 Acute on chronic diastolic (congestive) heart failure: Secondary | ICD-10-CM | POA: Diagnosis present

## 2021-03-30 DIAGNOSIS — Z20822 Contact with and (suspected) exposure to covid-19: Secondary | ICD-10-CM | POA: Diagnosis present

## 2021-03-30 DIAGNOSIS — I248 Other forms of acute ischemic heart disease: Secondary | ICD-10-CM | POA: Diagnosis present

## 2021-03-30 DIAGNOSIS — K766 Portal hypertension: Secondary | ICD-10-CM | POA: Diagnosis present

## 2021-03-30 DIAGNOSIS — K7460 Unspecified cirrhosis of liver: Secondary | ICD-10-CM | POA: Diagnosis present

## 2021-03-30 DIAGNOSIS — Z888 Allergy status to other drugs, medicaments and biological substances status: Secondary | ICD-10-CM | POA: Diagnosis not present

## 2021-03-30 DIAGNOSIS — D6959 Other secondary thrombocytopenia: Secondary | ICD-10-CM | POA: Diagnosis present

## 2021-03-30 DIAGNOSIS — I4819 Other persistent atrial fibrillation: Secondary | ICD-10-CM | POA: Diagnosis present

## 2021-03-30 DIAGNOSIS — Z8701 Personal history of pneumonia (recurrent): Secondary | ICD-10-CM | POA: Diagnosis not present

## 2021-03-30 DIAGNOSIS — Z6841 Body Mass Index (BMI) 40.0 and over, adult: Secondary | ICD-10-CM | POA: Diagnosis not present

## 2021-03-30 DIAGNOSIS — E662 Morbid (severe) obesity with alveolar hypoventilation: Secondary | ICD-10-CM | POA: Diagnosis present

## 2021-03-30 LAB — SARS CORONAVIRUS 2 (TAT 6-24 HRS): SARS Coronavirus 2: NEGATIVE

## 2021-03-30 LAB — CBC
HCT: 31.6 % — ABNORMAL LOW (ref 36.0–46.0)
Hemoglobin: 10.2 g/dL — ABNORMAL LOW (ref 12.0–15.0)
MCH: 30.8 pg (ref 26.0–34.0)
MCHC: 32.3 g/dL (ref 30.0–36.0)
MCV: 95.5 fL (ref 80.0–100.0)
Platelets: 34 10*3/uL — ABNORMAL LOW (ref 150–400)
RBC: 3.31 MIL/uL — ABNORMAL LOW (ref 3.87–5.11)
RDW: 15.9 % — ABNORMAL HIGH (ref 11.5–15.5)
WBC: 4.5 10*3/uL (ref 4.0–10.5)
nRBC: 0 % (ref 0.0–0.2)

## 2021-03-30 LAB — BASIC METABOLIC PANEL
Anion gap: 8 (ref 5–15)
BUN: 16 mg/dL (ref 8–23)
CO2: 30 mmol/L (ref 22–32)
Calcium: 8.9 mg/dL (ref 8.9–10.3)
Chloride: 101 mmol/L (ref 98–111)
Creatinine, Ser: 1.01 mg/dL — ABNORMAL HIGH (ref 0.44–1.00)
GFR, Estimated: >60 mL/min (ref >60)
Glucose, Bld: 138 mg/dL — ABNORMAL HIGH (ref 70–99)
Potassium: 3.3 mmol/L — ABNORMAL LOW (ref 3.5–5.1)
Sodium: 139 mmol/L (ref 135–145)

## 2021-03-30 MED ORDER — POTASSIUM CHLORIDE CRYS ER 20 MEQ PO TBCR
40.0000 meq | EXTENDED_RELEASE_TABLET | Freq: Two times a day (BID) | ORAL | Status: AC
  Administered 2021-03-30 (×2): 40 meq via ORAL
  Filled 2021-03-30 (×2): qty 2

## 2021-03-30 MED ORDER — DAPAGLIFLOZIN PROPANEDIOL 10 MG PO TABS
10.0000 mg | ORAL_TABLET | Freq: Every day | ORAL | Status: DC
  Filled 2021-03-30 (×2): qty 1

## 2021-03-30 NOTE — Progress Notes (Signed)
Heart Failure Stewardship Pharmacist Progress Note   PCP: Lujean Amel, MD PCP-Cardiologist: Skeet Latch, MD    HPI:  35 YOF with hx of cirrhosis, chronic diastolic HF, pulmonary HTN, Afib, and T2DM presenting to the ED on 03/29/21 with c/o SOB. Patient was just d/c from [facility] on 03/22/21 with dx of pneumonia and reports increasing SOB since then. Pt's husband states she had trouble sleeping even with 3 pillows for elevation and had to use a nebulizer at 2am the day of current ED admission. Pt reports a 4lb weight gain. Pt's husband reports that osat is ranging from 83-89%. ECHO on 03/20/21 shows cardiomegaly on right heart with LVEF 60-65%.    Current HF Medications: Furosemide IV 60 mg q12h Spironolactone 25 mg daily   Prior to admission HF Medications: Spironolactone 25 mg daily  Pertinent Lab Values: Serum creatinine 1.01, BUN 16, Potassium 3.3, Sodium 139, BNP 326.1, Magnesium 1.8  Vital Signs: Weight: 329 lbs (admission weight: 329 lbs) Blood pressure: 100s/60s  Heart rate: 70s   Medication Assistance / Insurance Benefits Check: Does the patient have prescription insurance?  Yes Type of insurance plan: state health plan  Does the patient qualify for medication assistance through manufacturers or grants?   Pending Eligible grants and/or patient assistance programs: pending Medication assistance applications in progress: none      Medication assistance applications approved: none Approved medication assistance renewals will be completed by: pending  Outpatient Pharmacy:  Prior to admission outpatient pharmacy: [facility] [place] Is the patient willing to use [place] at discharge? Yes Is the patient willing to transition their outpatient pharmacy to utilize a [facility] outpatient pharmacy?   Yes  Assessment: 1. Acute on chronic diastolic CHF (EF 95-63%), due to presumed nonischemic cardiomyopathy. NYHA class III symptoms. - Continue Furosemide IV   - Continue Spironolactone 25 mg  - Could consider SGLT2i for HF tx optimization, monitor renal fx  - Carvedilol not recommended d/t concern for BP dropping too low - Consider ARNI in the future once BP rises, continue to monitor renal fx   Plan: 1) Medication changes recommended at this time: - Initiate Farxiga 10 mg once daily   2) Patient assistance: - follow up scheduled for 7/19 with [facility], will hold off on making additional TOC appointment unless needed.  3)  Education  - To be completed prior to discharge  [place], Class of 2023

## 2021-03-30 NOTE — Progress Notes (Signed)
RT placed patient on CPAP with 2L O2 bled into circuit. Patient tolerating settings well at this time. RT will monitor as needed.

## 2021-03-30 NOTE — Evaluation (Signed)
Physical Therapy Evaluation Patient Details Name: Hannah Khan MRN: [id number] DOB: 12-23-1955 Today's Date: 03/30/2021   History of Present Illness  Hannah Khan is a 65 y.o. female presenting with SOB.  She was last hospitalized from 6/29-7/4 for CAP, treated for 5 days with Rocephin/Azithromycin.  She was also treated for RUE cellulitis and acute on chronic diastolic CHF during that hospitalization.  Since d/c, she feels like she never quire recovered from the PNA.  Her SOB got worse and worse.  She was unable to elevate her feet since she had orthopnea.  PMH -- with medical history significant of DM; afib not on AC due to chronic thrombocytopenia; HTN; chronic diastolic CHF; morbid obesity; OSA on CPAP; and NASH cirrhosis  Clinical Impression   Pt admitted with above diagnosis. Comes from home where she lives with her husband (uses a cane himself) in a single level home with a few steps to enter; prior to admission, pt using a Rollator for amb; Presents to pt with DOE, generalized weakness, and decr activity tolerance; minguard to min assist with simple bed mobiltiy, transfers, amb; Discussed pt with RN, who informed me that she had a panic attack-type spell before I arrived; Pt is pleasant and motivated -- today, she opted not to walk because she had just eased down to reltively relaxed after her panic spell; I anticipate she will do well, and plan to do an O2 qualifying walk next session; Pt currently with functional limitations due to the deficits listed below (see PT Problem List). Pt will benefit from skilled PT to increase their independence and safety with mobility to allow discharge to the venue listed below.       Follow Up Recommendations Other (comment);Outpatient PT (Consider Cardiac Rehab phase 2 for cardiopum endurance)    Equipment Recommendations  None recommended by PT (Well equipped)    Recommendations for Other Services OT consult (as ordered)     Precautions /  Restrictions Precautions Precaution Comments: monitor sats      Mobility  Bed Mobility Overal bed mobility: Modified Independent             General bed mobility comments: incr time    Transfers Overall transfer level: Needs assistance Equipment used: Rolling walker (2 wheeled) Transfers: Sit to/from Stand Sit to Stand: Min guard         General transfer comment: Cues for hand placement and safety  Ambulation/Gait             General Gait Details: Took a few sidesteps up to head of bed  Stairs            Wheelchair Mobility    Modified Rankin (Stroke Patients Only)       Balance Overall balance assessment: Mild deficits observed, not formally tested                                           Pertinent Vitals/Pain Pain Assessment: No/denies pain    Home Living Family/patient expects to be discharged to:: Private residence Living Arrangements: Spouse/significant other Available Help at Discharge: Family;Available 24 hours/day Type of Home: House Home Access: Stairs to enter Entrance Stairs-Rails: None Entrance Stairs-Number of Steps: 2 to 1 depending on the entrance Home Layout: One level Home Equipment: [place] - 4 wheels;Cane - single point;Tub bench;Bedside commode;Adaptive equipment;Walker - 2 wheels (transport chair) Additional Comments: husband walks  with a cane    Prior Function Level of Independence: Needs assistance   Gait / Transfers Assistance Needed: uses rollator for ambulation.  ADL's / Homemaking Assistance Needed: uses adaptive equipment , prn assistane with donning LB clothing and wiping. uses BSC predominantly, bench seat for bathing.  Comments: 4/13 shoulder surgery (bicep tendon repair), had just started (one tx) for shoulder therapy.     Hand Dominance   Dominant Hand: Right    Extremity/Trunk Assessment   Upper Extremity Assessment Upper Extremity Assessment: Defer to OT evaluation    Lower  Extremity Assessment Lower Extremity Assessment: Generalized weakness (some prox LE ROM deficits due ot body habitus)       Communication   Communication: No difficulties  Cognition Arousal/Alertness: Awake/alert Behavior During Therapy: WFL for tasks assessed/performed Overall Cognitive Status: Within Functional Limits for tasks assessed                                        General Comments General comments (skin integrity, edema, etc.): Noted dyspnea of 2/4 throughout brief session, notable with conversation; on 3 L supplemental O2    Exercises     Assessment/Plan    PT Assessment Patient needs continued PT services  PT Problem List Decreased strength;Decreased activity tolerance;Decreased mobility;Decreased knowledge of use of DME;Decreased knowledge of precautions;Cardiopulmonary status limiting activity       PT Treatment Interventions DME instruction;Gait training;Stair training;Functional mobility training;Therapeutic activities;Therapeutic exercise;Patient/family education    PT Goals (Current goals can be found in the Care Plan section)  Acute Rehab PT Goals Patient Stated Goal: I have to get ready for my next trip PT Goal Formulation: With patient Time For Goal Achievement: 04/13/21 Potential to Achieve Goals: Good    Frequency Min 3X/week   Barriers to discharge        Co-evaluation               AM-PAC PT "6 Clicks" Mobility  Outcome Measure Help needed turning from your back to your side while in a flat bed without using bedrails?: None Help needed moving from lying on your back to sitting on the side of a flat bed without using bedrails?: None Help needed moving to and from a bed to a chair (including a wheelchair)?: A Little Help needed standing up from a chair using your arms (e.g., wheelchair or bedside chair)?: A Little Help needed to walk in hospital room?: A Little Help needed climbing 3-5 steps with a railing? : A  Little 6 Click Score: 20    End of Session Equipment Utilized During Treatment: Oxygen Activity Tolerance: Patient tolerated treatment well Patient left: in bed;with call bell/phone within reach Nurse Communication: Mobility status PT Visit Diagnosis: Other abnormalities of gait and mobility (R26.89)    Time: 4287-6811 PT Time Calculation (min) (ACUTE ONLY): 31 min   Charges:   PT Evaluation $PT Eval Low Complexity: 1 Low PT Treatments $Therapeutic Activity: 8-22 mins        Roney Marion, PT  Acute Rehabilitation Services Pager [phone] Office [phone]   [place] 03/30/2021, 4:42 PM

## 2021-03-30 NOTE — Progress Notes (Signed)
PROGRESS NOTE    Hannah Khan  YQM:[id number] DOB: 1956/04/23 DOA: 03/29/2021 PCP: Lujean Amel, MD  Brief Narrative: Hannah Khan is a 65 year old chronically ill female with history of chronic diastolic CHF, morbid obesity, BMI of 60, OSA, NASH/cirrhosis, chronic thrombocytopenia, type 2 diabetes mellitus, presented to the ED with worsening shortness of breath. -Recently hospitalized from 6/29-7/4 for community-acquired pneumonia, treated with IV antibiotics, was also briefly diuresed during this time. -She reports ongoing dyspnea on exertion for weeks however worse in the last 3 days, did prior to admission it dropped to 70s and then mid 80s.  Also complains of worsening orthopnea. -Chest x-ray consistent with CHF   Assessment & Plan:     Acute on chronic diastolic CHF (congestive heart failure) (HCC) -Remains clinically volume overloaded with dyspnea and orthopnea today -Continue IV Lasix 60 mg every 12 with potassium, Aldactone -Educated regarding salt restriction, need for compliance with CPAP -Last echo this month noted preserved EF and indeterminate diastolic parameters was technically difficult due to size -PT eval, ambulate as tolerated, wean down O2  Morbid obesity, BMI of 60 OSA/OHS -Continue CPAP, educated regarding importance of weight loss  Hypertension -Continue Cardizem and Lopressor  Type 2 diabetes mellitus -Controlled, last A1c was 5.4, Amaryl on hold  Chronic A. Fib -Rate controlled, continue Lopressor and Cardizem, not on anticoagulation due to chronic thrombocytopenia  NASH/cirrhosis Chronic thrombocytopenia -Continue rifaximin, diuretics as noted above, continue lactulose  DVT prophylaxis: SCDs Code Status: Full code Family Communication: Discussed patient in detail, no family at bedside Disposition Plan:  Status is: Observation  The patient will require care spanning > 2 midnights and should be moved to inpatient because: Inpatient level of care  appropriate due to severity of illness  Dispo: The patient is from: Home              Anticipated d/c is to: Home              Patient currently is not medically stable to d/c.   Difficult to place patient No   Consultants:    Procedures:   Antimicrobials:    Subjective: -Breathing starting to improve, still has shortness of breath with minimal activity  Objective: Vitals:   03/29/21 1953 03/29/21 2342 03/30/21 0411 03/30/21 0825  BP: (!) 109/47 (!) 115/48 (!) 104/48 116/64  Pulse: 85 77 79   Resp: 19 18 20    Temp: 98.2 F (36.8 C) 98.4 F (36.9 C) 98.3 F (36.8 C)   TempSrc: Oral Oral Axillary   SpO2: 97% 95% 96%   Weight:   (!) 158.4 kg   Height:        Intake/Output Summary (Last 24 hours) at 03/30/2021 1125 Last data filed at 03/30/2021 8891 Gross per 24 hour  Intake 240 ml  Output 2100 ml  Net -1860 ml   Filed Weights   03/29/21 1601 03/30/21 0411  Weight: (!) 165.2 kg (!) 158.4 kg    Examination:  General exam: Obese chronically ill female sitting up in bed, AAOx3 HEENT: Neck obese unable to assess JVD CVS: S1-S2, regular rate rhythm Lungs: Decreased breath sounds to bases Abdomen: Soft, nontender, nondistended, abdominal wall edema noted Extremities: 1-2+ edema, chronic venous stasis changes  Skin: As above  Psychiatry: Judgement and insight appear normal. Mood & affect appropriate.     Data Reviewed:   CBC: Recent Labs  Lab 03/29/21 1317 03/30/21 0409  WBC 5.8 4.5  HGB 11.7* 10.2*  HCT 36.0 31.6*  MCV 96.5  95.5  PLT DCLMP 34*   Basic Metabolic Panel: Recent Labs  Lab 03/29/21 1317 03/30/21 0409  NA 137 139  K 3.5 3.3*  CL 101 101  CO2 28 30  GLUCOSE 132* 138*  BUN 14 16  CREATININE 1.02* 1.01*  CALCIUM 9.1 8.9   GFR: Estimated Creatinine Clearance: 84.3 mL/min (A) (by C-G formula based on SCr of 1.01 mg/dL (H)). Liver Function Tests: Recent Labs  Lab 03/29/21 1317  AST 26  ALT 17  ALKPHOS 78  BILITOT 4.4*  PROT 7.4   ALBUMIN 3.2*   No results for input(s): LIPASE, AMYLASE in the last 168 hours. Recent Labs  Lab 03/29/21 1317  AMMONIA 49*   Coagulation Profile: Recent Labs  Lab 03/29/21 1317  INR 1.2   Cardiac Enzymes: No results for input(s): CKTOTAL, CKMB, CKMBINDEX, TROPONINI in the last 168 hours. BNP (last 3 results) No results for input(s): PROBNP in the last 8760 hours. HbA1C: No results for input(s): HGBA1C in the last 72 hours. CBG: No results for input(s): GLUCAP in the last 168 hours. Lipid Profile: No results for input(s): CHOL, HDL, LDLCALC, TRIG, CHOLHDL, LDLDIRECT in the last 72 hours. Thyroid Function Tests: No results for input(s): TSH, T4TOTAL, FREET4, T3FREE, THYROIDAB in the last 72 hours. Anemia Panel: No results for input(s): VITAMINB12, FOLATE, FERRITIN, TIBC, IRON, RETICCTPCT in the last 72 hours. Urine analysis:    Component Value Date/Time   COLORURINE YELLOW 03/18/2021 0416   APPEARANCEUR HAZY (A) 03/18/2021 0416   LABSPEC 1.017 03/18/2021 0416   PHURINE 5.0 03/18/2021 0416   GLUCOSEU NEGATIVE 03/18/2021 0416   HGBUR MODERATE (A) 03/18/2021 0416   BILIRUBINUR NEGATIVE 03/18/2021 0416   KETONESUR NEGATIVE 03/18/2021 0416   PROTEINUR 30 (A) 03/18/2021 0416   UROBILINOGEN 2.0 (H) 01/31/2015 0055   NITRITE NEGATIVE 03/18/2021 0416   LEUKOCYTESUR SMALL (A) 03/18/2021 0416   Sepsis Labs: @LABRCNTIP (procalcitonin:4,lacticidven:4)  ) Recent Results (from the past 240 hour(s))  SARS CORONAVIRUS 2 (TAT 6-24 HRS) Nasopharyngeal Nasopharyngeal Swab     Status: None   Collection Time: 03/29/21  1:20 PM   Specimen: Nasopharyngeal Swab  Result Value Ref Range Status   SARS Coronavirus 2 NEGATIVE NEGATIVE Final    Comment: (NOTE) SARS-CoV-2 target nucleic acids are NOT DETECTED.  The SARS-CoV-2 RNA is generally detectable in upper and lower respiratory specimens during the acute phase of infection. Negative results do not preclude SARS-CoV-2 infection, do not  rule out co-infections with other pathogens, and should not be used as the sole basis for treatment or other patient management decisions. Negative results must be combined with clinical observations, patient history, and epidemiological information. The expected result is Negative.  Fact Sheet for Patients: SugarRoll.be  Fact Sheet for Healthcare Providers: https://www.woods-mathews.com/  This test is not yet approved or cleared by the [place] FDA and  has been authorized for detection and/or diagnosis of SARS-CoV-2 by FDA under an Emergency Use Authorization (EUA). This EUA will remain  in effect (meaning this test can be used) for the duration of the COVID-19 declaration under Se ction 564(b)(1) of the Act, 21 U.S.C. section 360bbb-3(b)(1), unless the authorization is terminated or revoked sooner.  Performed at [facility], [place] [address], [place],  [address]          Radiology Studies: DG Chest Port 1 View  Result Date: 03/29/2021 CLINICAL DATA:  Shortness of breath. EXAM: PORTABLE CHEST 1 VIEW COMPARISON:  03/17/2021 FINDINGS: 1345 hours. The cardio pericardial silhouette is  enlarged. Vascular congestion. Right base better aerated but there is persistent left base collapse/consolidation with probable small left effusion. The visualized bony structures of the thorax show no acute abnormality. Telemetry leads overlie the chest. IMPRESSION: Cardiomegaly with vascular congestion. Persistent retrocardiac left base collapse/consolidation with small left effusion. Electronically Signed   By: Misty Stanley M.D.   On: 03/29/2021 14:18        Scheduled Meds:  diltiazem  360 mg Oral Daily   docusate sodium  100 mg Oral BID   furosemide  60 mg Intravenous BID   lactulose  20 g Oral TID   metoprolol tartrate  50 mg Oral BID   potassium chloride  40 mEq Oral BID   rifaximin  550 mg Oral BID   sodium chloride flush  3 mL  Intravenous Q12H   spironolactone  25 mg Oral Daily   Continuous Infusions:   LOS: 0 days    Time spent: 67mn  PDomenic Polite MD [facility]   03/30/2021, 11:25 AM

## 2021-03-30 NOTE — Progress Notes (Signed)
Pt states that she takes Glimepiride (amaryl) at home for her diabetes and has never taken farxiga and does not want to switch medications because it has worked well for her with all she has going on with her liver. MD notified via secure chat

## 2021-03-30 NOTE — Progress Notes (Signed)
Nutrition Consult  RD consulted for nutrition education regarding weight loss. Patient reports that she lost 85 lbs several years ago by avoiding fast food and sodas. She eventually gained the weight back. She knows what she needs to do, but she has had a difficult time lately because her husband prepares meals and she is unable to do any excercise at this time.    Body mass index is 59.94 kg/m. Pt meets criteria for morbid obesity based on current BMI.  RD provided "Weight Loss Tips" handout from the Academy of Nutrition and Dietetics. Discussed importance of controlled and consistent intake throughout the day. Provided examples of ways to balance meals/snacks and encouraged intake of high-fiber, whole grain complex carbohydrates. Encouraged pt to discuss physical activity options with physician. Teach back method used.  Expect good compliance.  Current diet order is heart healthy carbohydrate modified, patient is consuming approximately 100% of meals at this time. Labs and medications reviewed. No further nutrition interventions warranted at this time. RD contact information provided. If additional nutrition issues arise, please re-consult RD.  Lucas Mallow, RD, LDN, CNSC Please refer to [facility] for contact information.

## 2021-03-30 NOTE — Evaluation (Signed)
Occupational Therapy Evaluation Patient Details Name: Hannah Khan MRN: [id number] DOB: 01-07-1956 Today's Date: 03/30/2021    History of Present Illness Hannah Khan is a 65 y.o. female presenting with SOB.  She was last hospitalized from 6/29-7/4 for CAP, treated for 5 days with Rocephin/Azithromycin.  She was also treated for RUE cellulitis and acute on chronic diastolic CHF during that hospitalization.  Since d/c, she feels like she never quire recovered from the PNA.  Her SOB got worse and worse.  She was unable to elevate her feet since she had orthopnea.  PMH -- with medical history significant of DM; afib not on AC due to chronic thrombocytopenia; HTN; chronic diastolic CHF; morbid obesity; OSA on CPAP; and NASH cirrhosis   Clinical Impression   Pt admitted with SOB. Pt recently had R shoulder sx and still has the precaution of no lifting more then 5 pounds. Pt has her husband to assist and family in the evenings as needed. Pt was on 2L of o2 and with no movement went from 97% to 92% and started to report SOB. Pt was able to complete sit to stand transfers with supervision, side stepping with no support  with min guard. AS the pt reports due to the space in the home they can not bring a walker into the area. Pt does have beside commode to use as needed.  Pt currently with functional limitations due to the deficits listed below (see OT Problem List).  Pt will benefit from skilled acute OT to increase their safety and independence with ADL and functional mobility for ADL to facilitate discharge to venue listed below.      Follow Up Recommendations  Supervision/Assistance - 24 hour (Pt does not want to have [place] therapy, would like to continue OP PT with R shoulder, also educated about OP cardiac rehab)    Equipment Recommendations       Recommendations for Other Services       Precautions / Restrictions Precautions Precaution Comments: monitor sats Restrictions Weight Bearing  Restrictions: No      Mobility Bed Mobility Overal bed mobility: Modified Independent                  Transfers Overall transfer level: Needs assistance   Transfers: Sit to/from Stand Sit to Stand: Supervision Stand pivot transfers: Supervision       General transfer comment: Pt transfered from surfaces and couple steps    Balance Overall balance assessment: Mild deficits observed, not formally tested                                         ADL either performed or assessed with clinical judgement   ADL Overall ADL's : Needs assistance/impaired Eating/Feeding: Independent;Sitting   Grooming: Wash/dry hands;Wash/dry face;Supervision/safety;Sitting;Standing   Upper Body Bathing: Cueing for safety;Cueing for sequencing;Sitting;Supervision/ safety   Lower Body Bathing: Minimal assistance;Cueing for safety;Cueing for sequencing;Sit to/from stand   Upper Body Dressing : Supervision/safety;Cueing for safety;Cueing for sequencing;Sitting   Lower Body Dressing: Minimal assistance;Cueing for safety;Cueing for sequencing;Sit to/from stand   Toilet Transfer: Min guard;Regular Toilet;Grab bars   Toileting- Water quality scientist and Hygiene: Moderate assistance;Cueing for safety;Cueing for sequencing;Sit to/from stand   Tub/ Banker: Moderate assistance           Vision Baseline Vision/History: Wears glasses Wears Glasses: At all times Patient Visual Report: No change from baseline  Perception     Praxis      Pertinent Vitals/Pain Pain Assessment: No/denies pain     Hand Dominance Right   Extremity/Trunk Assessment Upper Extremity Assessment Upper Extremity Assessment: RUE deficits/detail RUE Deficits / Details: per pt have a weight lifting of 5 pounds on R shoulder till orthopedic followup   Lower Extremity Assessment Lower Extremity Assessment: Defer to PT evaluation   Cervical / Trunk Assessment Cervical / Trunk  Assessment: Normal   Communication Communication Communication: No difficulties   Cognition Arousal/Alertness: Awake/alert Behavior During Therapy: WFL for tasks assessed/performed Overall Cognitive Status: Within Functional Limits for tasks assessed                                     General Comments       Exercises     Shoulder Instructions      Home Living Family/patient expects to be discharged to:: Private residence Living Arrangements: Spouse/significant other Available Help at Discharge: Family;Available 24 hours/day Type of Home: House Home Access: Stairs to enter [employer] of Steps: 2 to 1 depending on the entrance Entrance Stairs-Rails: None Home Layout: One level     Bathroom Shower/Tub: Teacher, early years/pre: Standard     Home Equipment: Environmental consultant - 4 wheels;Cane - single point;Tub bench;Bedside commode;Adaptive equipment;Walker - 2 wheels (transport chair)   Additional Comments: husband walks with a cane      Prior Functioning/Environment Level of Independence: Needs assistance  Gait / Transfers Assistance Needed: uses rollator for ambulation. ADL's / Homemaking Assistance Needed: uses adaptive equipment , prn assistane with donning LB clothing and wiping. uses BSC predominantly, bench seat for bathing.   Comments: 4/13 shoulder surgery (bicep tendon repair), had just started (one tx) for shoulder therapy.        OT Problem List: Decreased strength;Decreased range of motion;Decreased activity tolerance;Impaired balance (sitting and/or standing);Cardiopulmonary status limiting activity      OT Treatment/Interventions: Self-care/ADL training;Therapeutic exercise;Energy conservation;DME and/or AE instruction;Therapeutic activities;Visual/perceptual remediation/compensation;Patient/family education;Balance training    OT Goals(Current goals can be found in the care plan section) Acute Rehab OT Goals Patient Stated  Goal: I have to get ready for my next trip OT Goal Formulation: With patient Time For Goal Achievement: 04/07/21 Potential to Achieve Goals: Good ADL Goals Pt Will Perform Lower Body Dressing: sit to/from stand;with modified independence Pt Will Transfer to Toilet: with modified independence;ambulating Pt Will Perform Tub/Shower Transfer: with supervision;ambulating;shower seat  OT Frequency: Min 2X/week   Barriers to D/C:            Co-evaluation              AM-PAC OT "6 Clicks" Daily Activity     Outcome Measure Help from another person eating meals?: None Help from another person taking care of personal grooming?: None Help from another person toileting, which includes using toliet, bedpan, or urinal?: A Lot Help from another person bathing (including washing, rinsing, drying)?: A Little Help from another person to put on and taking off regular upper body clothing?: A Little Help from another person to put on and taking off regular lower body clothing?: A Little 6 Click Score: 19   End of Session Nurse Communication: Mobility status  Activity Tolerance: Other (comment) (o2/SOB) Patient left: in chair;with call bell/phone within reach  OT Visit Diagnosis: Unsteadiness on feet (R26.81);Repeated falls (R29.6);Muscle weakness (generalized) (M62.81)  Time: 2111-5520 OT Time Calculation (min): 68 min Charges:  OT General Charges $OT Visit: 1 Visit OT Evaluation $OT Eval Low Complexity: 1 Low OT Treatments $Self Care/Home Management : 53-67 mins  Joeseph Amor OTR/L  Acute Rehab Services  [phone] office number [phone] pager number   Joeseph Amor 03/30/2021, 10:26 AM

## 2021-03-30 NOTE — Progress Notes (Signed)
Physical Therapy Note  PT eval complete with full note to follow;  Recommend home for transition out of hospital;  Recommend Cardiac Rehab Phase 2, or Outpt PT for  follow up to address endurance (noted pt wishes to re-start Outpt OT for her shoulder);  Will follow, and plan on starting to look at O2 qualifying walk next session;   Roney Marion, [place] Pager [phone] Office [phone]

## 2021-03-31 DIAGNOSIS — I5033 Acute on chronic diastolic (congestive) heart failure: Secondary | ICD-10-CM | POA: Diagnosis not present

## 2021-03-31 LAB — CBC
HCT: 32.4 % — ABNORMAL LOW (ref 36.0–46.0)
Hemoglobin: 10.5 g/dL — ABNORMAL LOW (ref 12.0–15.0)
MCH: 30.6 pg (ref 26.0–34.0)
MCHC: 32.4 g/dL (ref 30.0–36.0)
MCV: 94.5 fL (ref 80.0–100.0)
Platelets: 31 10*3/uL — ABNORMAL LOW (ref 150–400)
RBC: 3.43 MIL/uL — ABNORMAL LOW (ref 3.87–5.11)
RDW: 15.9 % — ABNORMAL HIGH (ref 11.5–15.5)
WBC: 4.2 10*3/uL (ref 4.0–10.5)
nRBC: 0 % (ref 0.0–0.2)

## 2021-03-31 LAB — BASIC METABOLIC PANEL
Anion gap: 8 (ref 5–15)
BUN: 14 mg/dL (ref 8–23)
CO2: 31 mmol/L (ref 22–32)
Calcium: 9.1 mg/dL (ref 8.9–10.3)
Chloride: 100 mmol/L (ref 98–111)
Creatinine, Ser: 1.07 mg/dL — ABNORMAL HIGH (ref 0.44–1.00)
GFR, Estimated: 58 mL/min — ABNORMAL LOW (ref >60)
Glucose, Bld: 121 mg/dL — ABNORMAL HIGH (ref 70–99)
Potassium: 3.3 mmol/L — ABNORMAL LOW (ref 3.5–5.1)
Sodium: 139 mmol/L (ref 135–145)

## 2021-03-31 MED ORDER — POTASSIUM CHLORIDE CRYS ER 20 MEQ PO TBCR
40.0000 meq | EXTENDED_RELEASE_TABLET | Freq: Once | ORAL | Status: AC
  Administered 2021-03-31: 40 meq via ORAL
  Filled 2021-03-31: qty 2

## 2021-03-31 NOTE — Progress Notes (Addendum)
Heart Failure Stewardship Pharmacist Progress Note   PCP: Lujean Amel, MD PCP-Cardiologist: Skeet Latch, MD    HPI:  78 YOF with hx of cirrhosis, chronic diastolic HF, pulmonary HTN, Afib, and T2DM presenting to the ED on 03/29/21 with c/o SOB. Patient was just d/c from [facility] on 03/22/21 with dx of pneumonia and reports increasing SOB since then. Pt's husband states she had trouble sleeping even with 3 pillows for elevation. Pt reported a 4lb weight gain.  ECHO on 03/20/21 shows LVEF 60-65%.    Current HF Medications: Furosemide IV 60 mg q12h Spironolactone 25 mg daily   Prior to admission HF Medications: Spironolactone 25 mg daily  Pertinent Lab Values: Serum creatinine 1.07, BUN 14, Potassium 3.3, Sodium 139, BNP 326.1  Vital Signs: Weight: 348 lbs (admission weight: 364 lbs) Blood pressure: 100s/60s  Heart rate: 70s   Medication Assistance / Insurance Benefits Check: Does the patient have prescription insurance?  Yes Type of insurance plan: state health plan (transitioning to Medicare shortly)  Outpatient Pharmacy:  Prior to admission outpatient [facility] [place] Is the patient willing to use [place] at discharge? Yes  Assessment: 1. Acute on chronic diastolic CHF (EF 71-06%), due to presumed nonischemic cardiomyopathy. NYHA class III symptoms. - Continue Furosemide IV  - Continue Spironolactone 25 mg  - Could consider Farxiga 10 mg daily for HFpEF optimization. Patient was initiated on this yesterday but had refused with concerns for it replacing glimepiride and questioned it's effect on liver impairment. - No beta blocker at this time with low BP - Can consider adding Entresto in the future if BP allows for additional HFpEF optimization.   Plan: 1) Medication changes recommended at this time: - Start Farxiga 10 mg once daily   2) Patient assistance: - follow up scheduled for 7/19 with [facility], will hold off on making additional TOC  appointment unless needed.  3)  Education  - To be completed prior to discharge  [place], Class of 2023

## 2021-03-31 NOTE — Progress Notes (Signed)
Negative PROGRESS NOTE    Hannah Khan  UMP:[id number] DOB: 05/23/1956 DOA: 03/29/2021 PCP: Lujean Amel, MD   Brief Narrative: 65 year old-year-old female with history of chronic diastolic heart failure, morbid obesity, BMI of 60, OSA, NASH/cirrhosis, chronic thrombocytopenia, diabetes type 2 presents to the ED complaining of worsening shortness of breath.  -Recently hospitalized from 6/29 until 7/4 for community-acquired pneumonia, treated with IV antibiotics, was also briefly diuresed during that time. -She reported ongoing dyspnea on exertion for weeks however worse in the last 3 days, oxygen dropped to 70s and in the mid 80s prior to admission.  X-ray consistent with CHF.    Assessment & Plan:   Principal Problem:   Acute on chronic diastolic CHF (congestive heart failure) (HCC) Active Problems:   Obstructive sleep apnea   Essential hypertension   Persistent atrial fibrillation (HCC)   Morbid obesity-   Type 2 diabetes mellitus with other specified complication (HCC)   Liver cirrhosis secondary to NASH (nonalcoholic steatohepatitis) (HCC)   Hyperlipidemia  1-Acute on chronic diastolic heart failure exacerbation -Suspect related to diet indiscretion -Continue with IV Lasix twice daily and spironolactone -Diet education -Wean off oxygen as tolerated -Weight : 364----349--- 348 -Patient does not want to start Farxiga  2-morbid obesity, BMI 60 Failure/OHS Continue with CPAP  Hypertension: Continue with Cardizem and Lopressor  Type 2 diabetes:  A1c 5.4 Resume Amaryl at discharge. Continue with correctional insulin while inpatient  Chronic A. fib: Continue with Lopressor and Cardizem. For anticoagulation due to thrombocytopenia  NASH/cirrhosis Chronic thrombocytopenia Continue with rifaximin, diuretics lactulose  Chronic Thrombocytopenia; stable.   Estimated body mass index is 59.79 kg/m as calculated from the following:   Height as of this encounter: 5'  4" (1.626 m).   Weight as of this encounter: 158 kg.   DVT prophylaxis: SCD, not anticoagulation due to thrombocytopenia.  Code Status: Full Code Family Communication: care discussed with patient. Husband at bedside.  Disposition Plan:  Status is: Inpatient  Remains inpatient appropriate because: IV medications needed.   Dispo: The patient is from: Home              Anticipated d/c is to: Home              Patient currently is not medically stable to d/c.   Difficult to place patient No        Consultants:  None  Procedures:  None  Antimicrobials:    Subjective: She report some improvement.  She is concern with starting new medication farxiga, she was not inform of changes.  She uses Ted hose at home.   Objective: Vitals:   03/30/21 2330 03/30/21 2342 03/31/21 0500 03/31/21 1244  BP: 111/63  (!) 97/41 (!) 105/59  Pulse: 72 75 74 63  Resp: 19 16 18 16   Temp: 97.8 F (36.6 C)  98.8 F (37.1 C) 97.7 F (36.5 C)  TempSrc: Oral  Oral Oral  SpO2: 100% 99% 90% 98%  Weight:   (!) 158 kg   Height:        Intake/Output Summary (Last 24 hours) at 03/31/2021 1532 Last data filed at 03/31/2021 0516 Gross per 24 hour  Intake --  Output 1000 ml  Net -1000 ml   Filed Weights   03/29/21 1601 03/30/21 0411 03/31/21 0500  Weight: (!) 165.2 kg (!) 158.4 kg (!) 158 kg    Examination:  General exam: Appears calm and comfortable  Respiratory system: BL crackles. . Cardiovascular system: S1 & S2 heard, RRR.  Gastrointestinal system: Abdomen is nondistended, soft and nontender. No organomegaly or masses felt. Normal bowel sounds heard. Central nervous system: Alert and oriented.  Extremities: plus 2 edema  Data Reviewed: I have personally reviewed following labs and imaging studies  CBC: Recent Labs  Lab 03/29/21 1317 03/30/21 0409 03/31/21 0400  WBC 5.8 4.5 4.2  HGB 11.7* 10.2* 10.5*  HCT 36.0 31.6* 32.4*  MCV 96.5 95.5 94.5  PLT DCLMP 34* 31*   Basic  Metabolic Panel: Recent Labs  Lab 03/29/21 1317 03/30/21 0409 03/31/21 0400  NA 137 139 139  K 3.5 3.3* 3.3*  CL 101 101 100  CO2 28 30 31   GLUCOSE 132* 138* 121*  BUN 14 16 14   CREATININE 1.02* 1.01* 1.07*  CALCIUM 9.1 8.9 9.1   GFR: Estimated Creatinine Clearance: 79.4 mL/min (A) (by C-G formula based on SCr of 1.07 mg/dL (H)). Liver Function Tests: Recent Labs  Lab 03/29/21 1317  AST 26  ALT 17  ALKPHOS 78  BILITOT 4.4*  PROT 7.4  ALBUMIN 3.2*   No results for input(s): LIPASE, AMYLASE in the last 168 hours. Recent Labs  Lab 03/29/21 1317  AMMONIA 49*   Coagulation Profile: Recent Labs  Lab 03/29/21 1317  INR 1.2   Cardiac Enzymes: No results for input(s): CKTOTAL, CKMB, CKMBINDEX, TROPONINI in the last 168 hours. BNP (last 3 results) No results for input(s): PROBNP in the last 8760 hours. HbA1C: No results for input(s): HGBA1C in the last 72 hours. CBG: No results for input(s): GLUCAP in the last 168 hours. Lipid Profile: No results for input(s): CHOL, HDL, LDLCALC, TRIG, CHOLHDL, LDLDIRECT in the last 72 hours. Thyroid Function Tests: No results for input(s): TSH, T4TOTAL, FREET4, T3FREE, THYROIDAB in the last 72 hours. Anemia Panel: No results for input(s): VITAMINB12, FOLATE, FERRITIN, TIBC, IRON, RETICCTPCT in the last 72 hours. Sepsis Labs: No results for input(s): PROCALCITON, LATICACIDVEN in the last 168 hours.  Recent Results (from the past 240 hour(s))  SARS CORONAVIRUS 2 (TAT 6-24 HRS) Nasopharyngeal Nasopharyngeal Swab     Status: None   Collection Time: 03/29/21  1:20 PM   Specimen: Nasopharyngeal Swab  Result Value Ref Range Status   SARS Coronavirus 2 NEGATIVE NEGATIVE Final    Comment: (NOTE) SARS-CoV-2 target nucleic acids are NOT DETECTED.  The SARS-CoV-2 RNA is generally detectable in upper and lower respiratory specimens during the acute phase of infection. Negative results do not preclude SARS-CoV-2 infection, do not rule  out co-infections with other pathogens, and should not be used as the sole basis for treatment or other patient management decisions. Negative results must be combined with clinical observations, patient history, and epidemiological information. The expected result is Negative.  Fact Sheet for Patients: SugarRoll.be  Fact Sheet for Healthcare Providers: https://www.woods-mathews.com/  This test is not yet approved or cleared by the [place] FDA and  has been authorized for detection and/or diagnosis of SARS-CoV-2 by FDA under an Emergency Use Authorization (EUA). This EUA will remain  in effect (meaning this test can be used) for the duration of the COVID-19 declaration under Se ction 564(b)(1) of the Act, 21 U.S.C. section 360bbb-3(b)(1), unless the authorization is terminated or revoked sooner.  Performed at [facility], [place] [address], [place],  [address]          Radiology Studies: No results found.      Scheduled Meds:  diltiazem  360 mg Oral Daily   docusate sodium  100 mg Oral BID  furosemide  60 mg Intravenous BID   lactulose  20 g Oral TID   metoprolol tartrate  50 mg Oral BID   rifaximin  550 mg Oral BID   sodium chloride flush  3 mL Intravenous Q12H   spironolactone  25 mg Oral Daily   Continuous Infusions:   LOS: 1 day    Time spent: 35 minutes.     Elmarie Shiley, MD [facility]   If 7PM-7AM, please contact night-coverage [email or web address]  03/31/2021, 3:33 PM

## 2021-03-31 NOTE — Progress Notes (Signed)
Occupational Therapy Treatment Patient Details Name: Hannah Khan MRN: [id number] DOB: 06-01-1956 Today's Date: 03/31/2021    History of present illness Hannah Khan is a 65 y.o. female presenting with SOB.  She was last hospitalized from 6/29-7/4 for CAP, treated for 5 days with Rocephin/Azithromycin.  She was also treated for RUE cellulitis and acute on chronic diastolic CHF during that hospitalization.  Since d/c, she feels like she never quire recovered from the PNA.  Her SOB got worse and worse.  She was unable to elevate her feet since she had orthopnea.  PMH -- with medical history significant of DM; afib not on AC due to chronic thrombocytopenia; HTN; chronic diastolic CHF; morbid obesity; OSA on CPAP; and NASH cirrhosis   OT comments  Pt reported feeling very sleepy today due to las of sleep. Pt on room air in supine was 92%, supine to sitting went to 89% then was cued on breathing techniques and want back to 91%, LE dressing with min assist remained at 90% and then functional mobility within room 89% but required cues for breathing but pt did report they started to feel SOB. Pt was educated about compensations on how to complete tasks to decrease need of husband and use of AE. Pt currently with functional limitations due to the deficits listed below (see OT Problem List).  Pt will benefit from skilled acute OT to increase their safety and independence with ADL and functional mobility for ADL to facilitate discharge to venue listed below.    Follow Up Recommendations  OP level cardiac rehab but also needs to complete rehab for R shoulder Supervision/Assistance - 24 hour    Equipment Recommendations  None recommended by OT    Recommendations for Other Services      Precautions / Restrictions Precautions Precaution Comments: monitor sats       Mobility Bed Mobility Overal bed mobility: Modified Independent             General bed mobility comments: increase time HOB  elevated    Transfers Overall transfer level: Needs assistance Equipment used: Rolling walker (2 wheeled) Transfers: Sit to/from Stand Sit to Stand: Supervision Stand pivot transfers: Supervision            Balance                                           ADL either performed or assessed with clinical judgement   ADL Overall ADL's : Needs assistance/impaired Eating/Feeding: Independent;Sitting   Grooming: Wash/dry hands;Wash/dry face;Supervision/safety;Sitting;Standing   Upper Body Bathing: Supervision/ safety;Adhering to UE precautions;Cueing for safety;Cueing for sequencing;Sitting   Lower Body Bathing: Minimal assistance;Cueing for safety;Cueing for sequencing;Sit to/from stand   Upper Body Dressing : Supervision/safety;Cueing for safety;Cueing for sequencing;Sitting   Lower Body Dressing: Minimal assistance;Cueing for safety;Cueing for sequencing;Sit to/from stand                       Vision       Perception     Praxis      Cognition Arousal/Alertness: Awake/alert Behavior During Therapy: [facility] for tasks assessed/performed Overall Cognitive Status: Within Functional Limits for tasks assessed  Exercises     Shoulder Instructions       General Comments Pt without o2 was ranging from 92-89% with limited activity in room but started to became out of breath    Pertinent Vitals/ Pain       Pain Assessment: No/denies pain  Home Living                                          Prior Functioning/Environment              Frequency  Min 2X/week        Progress Toward Goals  OT Goals(current goals can now be found in the care plan section)  Progress towards OT goals: Progressing toward goals  Acute Rehab OT Goals Patient Stated Goal: To get some sleep OT Goal Formulation: With patient Time For Goal Achievement: 04/07/21 Potential to Achieve  Goals: Good ADL Goals Pt Will Perform Lower Body Dressing: sit to/from stand;with modified independence Pt Will Transfer to Toilet: with modified independence;ambulating Pt Will Perform Tub/Shower Transfer: with supervision;ambulating;shower seat  Plan Discharge plan remains appropriate    Co-evaluation                 AM-PAC OT "6 Clicks" Daily Activity     Outcome Measure   Help from another person eating meals?: None Help from another person taking care of personal grooming?: None Help from another person toileting, which includes using toliet, bedpan, or urinal?: A Lot Help from another person bathing (including washing, rinsing, drying)?: A Little Help from another person to put on and taking off regular upper body clothing?: A Little Help from another person to put on and taking off regular lower body clothing?: A Little 6 Click Score: 19    End of Session Equipment Utilized During Treatment: Rolling walker  OT Visit Diagnosis: Unsteadiness on feet (R26.81);Repeated falls (R29.6);Muscle weakness (generalized) (M62.81)   Activity Tolerance Other (comment)   Patient Left in chair;with call bell/phone within reach   Nurse Communication  (o2 status and the lack of sleep with lights)        Time: 6283-6629 OT Time Calculation (min): 24 min  Charges: OT General Charges $OT Visit: 1 Visit OT Treatments $Self Care/Home Management : 23-37 mins  Joeseph Amor OTR/L  Acute Rehab Services  [phone] office number [phone] pager number    Joeseph Amor 03/31/2021, 12:13 PM

## 2021-04-01 DIAGNOSIS — I4819 Other persistent atrial fibrillation: Secondary | ICD-10-CM

## 2021-04-01 DIAGNOSIS — I5033 Acute on chronic diastolic (congestive) heart failure: Secondary | ICD-10-CM | POA: Diagnosis not present

## 2021-04-01 LAB — BASIC METABOLIC PANEL
Anion gap: 9 (ref 5–15)
BUN: 15 mg/dL (ref 8–23)
CO2: 28 mmol/L (ref 22–32)
Calcium: 9.1 mg/dL (ref 8.9–10.3)
Chloride: 100 mmol/L (ref 98–111)
Creatinine, Ser: 1 mg/dL (ref 0.44–1.00)
GFR, Estimated: >60 mL/min (ref >60)
Glucose, Bld: 112 mg/dL — ABNORMAL HIGH (ref 70–99)
Potassium: 4.4 mmol/L (ref 3.5–5.1)
Sodium: 137 mmol/L (ref 135–145)

## 2021-04-01 MED ORDER — POTASSIUM CHLORIDE CRYS ER 20 MEQ PO TBCR
40.0000 meq | EXTENDED_RELEASE_TABLET | ORAL | Status: AC
  Administered 2021-04-01 (×2): 40 meq via ORAL
  Filled 2021-04-01 (×2): qty 2

## 2021-04-01 MED ORDER — POTASSIUM CHLORIDE CRYS ER 20 MEQ PO TBCR
40.0000 meq | EXTENDED_RELEASE_TABLET | Freq: Once | ORAL | Status: AC
  Administered 2021-04-01: 40 meq via ORAL
  Filled 2021-04-01: qty 2

## 2021-04-01 MED ORDER — FUROSEMIDE 10 MG/ML IJ SOLN
80.0000 mg | Freq: Three times a day (TID) | INTRAMUSCULAR | Status: DC
  Administered 2021-04-01 – 2021-04-04 (×8): 80 mg via INTRAVENOUS
  Filled 2021-04-01 (×7): qty 8

## 2021-04-01 MED ORDER — DAPAGLIFLOZIN PROPANEDIOL 10 MG PO TABS
10.0000 mg | ORAL_TABLET | Freq: Every day | ORAL | Status: DC
  Administered 2021-04-02 – 2021-04-04 (×3): 10 mg via ORAL
  Filled 2021-04-01 (×4): qty 1

## 2021-04-01 MED ORDER — POTASSIUM CHLORIDE CRYS ER 20 MEQ PO TBCR
40.0000 meq | EXTENDED_RELEASE_TABLET | Freq: Two times a day (BID) | ORAL | Status: DC
  Administered 2021-04-02 – 2021-04-04 (×5): 40 meq via ORAL
  Filled 2021-04-01 (×5): qty 2

## 2021-04-01 NOTE — Progress Notes (Signed)
Heart Failure Stewardship Pharmacist Progress Note   PCP: Lujean Amel, MD PCP-Cardiologist: Skeet Latch, MD    HPI:  89 YOF with hx of cirrhosis, chronic diastolic HF, pulmonary HTN, Afib, and T2DM presenting to the ED on 03/29/21 with c/o SOB. Patient was just d/c from [facility] on 03/22/21 with dx of pneumonia and reports increasing SOB since then. Pt's husband states she had trouble sleeping even with 3 pillows for elevation. Pt reported a 4lb weight gain between admissions.  Most recent ECHO on 03/20/21 shows LVEF 60-65%.    Current HF Medications: Furosemide IV 60 mg q12h Metoprolol tartrate 50 mg BID Spironolactone 25 mg daily   Prior to admission HF Medications: Spironolactone 25 mg daily  Pertinent Lab Values: As of 7/13: Serum creatinine 1.07, BUN 14, Potassium 3.3, Sodium 139, BNP 326.1  Vital Signs: Weight: 345 lbs (admission weight: 364 lbs) Blood pressure: 100s/60s  Heart rate: 70s   Medication Assistance / Insurance Benefits Check: Does the patient have prescription insurance?  Yes Type of insurance plan: state health plan (transitioning to Medicare shortly)  Outpatient Pharmacy:  Prior to admission [facility] [place] Is the patient willing to use [place] at discharge? Yes  Assessment: 1. Acute on chronic diastolic CHF (EF 09-32%), due to presumed nonischemic cardiomyopathy. NYHA class III symptoms. - Continue Furosemide IV 60 mg BID. - Continue Spironolactone 25 mg  - Could consider Farxiga 10 mg daily for HFpEF optimization. Patient was initiated on this on 7/12 but had refused with concerns for it replacing glimepiride and questioned it's effect on liver impairment. Discussed with patient (see note from yesterday) and she is willing to start [place]. She had been on Jardiance in the past but it was stopped in May 2022 due to affordability issues. However, Farxiga patient assistance can be obtained so cost is not the  limiting factor.  - Metoprolol 50 mg BID started today - Can consider adding Entresto in the future if BP allows for additional HFpEF optimization.   Plan: 1) Medication changes recommended at this time: - Start Farxiga 10 mg once daily   2) Patient assistance: - follow up scheduled for 7/19 with [facility], will hold off on making additional TOC appointment unless needed.  3)  Education  - Patient has been educated on current HF medications and potential additions to HF medication regimen - Patient verbalizes understanding that over the next few months, these medication doses may change and more medications may be added to optimize HF regimen - Patient has been educated on basic disease state pathophysiology and goals of therapy - Time spent 30 mins   Kerby Nora, PharmD, BCPS Heart Failure Stewardship Pharmacist Phone [phone]  Please check [email or web address] for unit-specific pharmacist phone numbers

## 2021-04-01 NOTE — Progress Notes (Signed)
Negative PROGRESS NOTE    Hannah Khan  FOY:[id number] DOB: 09/24/1955 DOA: 03/29/2021 PCP: Lujean Amel, MD   Brief Narrative: 65 year old-year-old female with history of chronic diastolic heart failure, morbid obesity, BMI of 60, OSA, NASH/cirrhosis, chronic thrombocytopenia, diabetes type 2 presents to the ED complaining of worsening shortness of breath.  -Recently hospitalized from 6/29 until 7/4 for community-acquired pneumonia, treated with IV antibiotics, was also briefly diuresed during that time. -She reported ongoing dyspnea on exertion for weeks however worse in the last 3 days, oxygen dropped to 70s and in the mid 80s prior to admission.  X-ray consistent with CHF.    Assessment & Plan:   Principal Problem:   Acute on chronic diastolic CHF (congestive heart failure) (HCC) Active Problems:   Obstructive sleep apnea   Essential hypertension   Persistent atrial fibrillation (HCC)   Morbid obesity-   Type 2 diabetes mellitus with other specified complication (HCC)   Liver cirrhosis secondary to NASH (nonalcoholic steatohepatitis) (HCC)   Hyperlipidemia  1-Acute on chronic diastolic heart failure exacerbation -Suspect related to diet indiscretion -Continue with IV Lasix twice daily and spironolactone -Diet education -Wean off oxygen as tolerated -Weight : 364----349--- 348--345. -cardiology consulted. Will defer to cardiology start Farxiga.   2-morbid obesity, BMI 60 Failure/OHS Continue with CPAP  Hypertension: Continue with Cardizem and Lopressor  Type 2 diabetes:  A1c 5.4 Resume Amaryl at discharge. Continue with correctional insulin while inpatient  Chronic A. fib: Continue with Lopressor and Cardizem. Not on  anticoagulation due to thrombocytopenia.  NASH/cirrhosis Chronic thrombocytopenia Continue with rifaximin, diuretics lactulose  Chronic Thrombocytopenia; stable.  Hypokalemia; received potassium. Monitor Bmet.   Estimated body mass index  is 59.26 kg/m as calculated from the following:   Height as of this encounter: 5' 4"  (1.626 m).   Weight as of this encounter: 156.6 kg.   DVT prophylaxis: SCD, not anticoagulation due to thrombocytopenia.  Code Status: Full Code Family Communication: Care discussed with patient. Husband at bedside.  Disposition Plan:  Status is: Inpatient  Remains inpatient appropriate because: IV medications needed.   Dispo: The patient is from: Home              Anticipated d/c is to: Home              Patient currently is not medically stable to d/c.   Difficult to place patient No        Consultants:  None  Procedures:  None  Antimicrobials:    Subjective: She report dyspnea on exertion. Back on oxygen this am.    Objective: Vitals:   03/31/21 2306 04/01/21 0610 04/01/21 1000 04/01/21 1132  BP: (!) 110/57 99/69 (!) 103/49 108/61  Pulse: 82 89  76  Resp: 18 18  18   Temp: 98.7 F (37.1 C) 98.4 F (36.9 C)  98.6 F (37 C)  TempSrc: Oral Oral  Oral  SpO2: 97% 98%  96%  Weight:  (!) 156.6 kg    Height:        Intake/Output Summary (Last 24 hours) at 04/01/2021 1542 Last data filed at 04/01/2021 7672 Gross per 24 hour  Intake 720 ml  Output 1000 ml  Net -280 ml    Filed Weights   03/30/21 0411 03/31/21 0500 04/01/21 0610  Weight: (!) 158.4 kg (!) 158 kg (!) 156.6 kg    Examination:  General exam: NAD Respiratory system: BL crackles.  Cardiovascular system: S 1, S 2 RRR Gastrointestinal system: BS present, soft, nt Central  nervous system: alert Extremities: trace edema  Data Reviewed: I have personally reviewed following labs and imaging studies  CBC: Recent Labs  Lab 03/29/21 1317 03/30/21 0409 03/31/21 0400  WBC 5.8 4.5 4.2  HGB 11.7* 10.2* 10.5*  HCT 36.0 31.6* 32.4*  MCV 96.5 95.5 94.5  PLT DCLMP 34* 31*    Basic Metabolic Panel: Recent Labs  Lab 03/29/21 1317 03/30/21 0409 03/31/21 0400  NA 137 139 139  K 3.5 3.3* 3.3*  CL 101 101 100   CO2 28 30 31   GLUCOSE 132* 138* 121*  BUN 14 16 14   CREATININE 1.02* 1.01* 1.07*  CALCIUM 9.1 8.9 9.1    GFR: Estimated Creatinine Clearance: 79 mL/min (A) (by C-G formula based on SCr of 1.07 mg/dL (H)). Liver Function Tests: Recent Labs  Lab 03/29/21 1317  AST 26  ALT 17  ALKPHOS 78  BILITOT 4.4*  PROT 7.4  ALBUMIN 3.2*    No results for input(s): LIPASE, AMYLASE in the last 168 hours. Recent Labs  Lab 03/29/21 1317  AMMONIA 49*    Coagulation Profile: Recent Labs  Lab 03/29/21 1317  INR 1.2    Cardiac Enzymes: No results for input(s): CKTOTAL, CKMB, CKMBINDEX, TROPONINI in the last 168 hours. BNP (last 3 results) No results for input(s): PROBNP in the last 8760 hours. HbA1C: No results for input(s): HGBA1C in the last 72 hours. CBG: No results for input(s): GLUCAP in the last 168 hours. Lipid Profile: No results for input(s): CHOL, HDL, LDLCALC, TRIG, CHOLHDL, LDLDIRECT in the last 72 hours. Thyroid Function Tests: No results for input(s): TSH, T4TOTAL, FREET4, T3FREE, THYROIDAB in the last 72 hours. Anemia Panel: No results for input(s): VITAMINB12, FOLATE, FERRITIN, TIBC, IRON, RETICCTPCT in the last 72 hours. Sepsis Labs: No results for input(s): PROCALCITON, LATICACIDVEN in the last 168 hours.  Recent Results (from the past 240 hour(s))  SARS CORONAVIRUS 2 (TAT 6-24 HRS) Nasopharyngeal Nasopharyngeal Swab     Status: None   Collection Time: 03/29/21  1:20 PM   Specimen: Nasopharyngeal Swab  Result Value Ref Range Status   SARS Coronavirus 2 NEGATIVE NEGATIVE Final    Comment: (NOTE) SARS-CoV-2 target nucleic acids are NOT DETECTED.  The SARS-CoV-2 RNA is generally detectable in upper and lower respiratory specimens during the acute phase of infection. Negative results do not preclude SARS-CoV-2 infection, do not rule out co-infections with other pathogens, and should not be used as the sole basis for treatment or other patient management  decisions. Negative results must be combined with clinical observations, patient history, and epidemiological information. The expected result is Negative.  Fact Sheet for Patients: SugarRoll.be  Fact Sheet for Healthcare Providers: https://www.woods-mathews.com/  This test is not yet approved or cleared by the [place] FDA and  has been authorized for detection and/or diagnosis of SARS-CoV-2 by FDA under an Emergency Use Authorization (EUA). This EUA will remain  in effect (meaning this test can be used) for the duration of the COVID-19 declaration under Se ction 564(b)(1) of the Act, 21 U.S.C. section 360bbb-3(b)(1), unless the authorization is terminated or revoked sooner.  Performed at [facility], [place] [address] [place], [place] [address]           Radiology Studies: No results found.      Scheduled Meds:  diltiazem  360 mg Oral Daily   docusate sodium  100 mg Oral BID   furosemide  60 mg Intravenous BID   lactulose  20 g Oral TID  metoprolol tartrate  50 mg Oral BID   rifaximin  550 mg Oral BID   sodium chloride flush  3 mL Intravenous Q12H   spironolactone  25 mg Oral Daily   Continuous Infusions:   LOS: 2 days    Time spent: 35 minutes.     Elmarie Shiley, MD [facility]   If 7PM-7AM, please contact night-coverage [email or web address]  04/01/2021, 3:42 PM

## 2021-04-01 NOTE — Progress Notes (Signed)
Patient currently on CPAP, with nasal pillows, auto titrate settings with 2LPM O2 bleed in.

## 2021-04-01 NOTE — Consult Note (Addendum)
Cardiology Consultation:   Patient ID: DENIM START MRN: [id number]; DOB: 12/16/1955  Admit date: 03/29/2021 Date of Consult: 04/01/2021  PCP:  Lujean Amel, MD   [facility] Providers Cardiologist:  Skeet Latch, MD        Patient Profile:   Hannah Khan is a 65 y.o. female with a hx of HFpEF, chronic A. fib, morbid obesity (BMI 50), HTN, OSA on CPAP, T2DM, NASH/cirrhosis, and HLD who is being seen 04/01/2021 for the evaluation of acute on chronic heart failure at the request of Dr. Tyrell Antonio.  History of Present Illness:   Hannah Khan is a 65 year old lady with a history of chronic diastolic heart failure and A. fib who presented to the hospital on 7/11 for worsening shortness of breath.  Patient reports that she was recently hospitalized at [facility] from 6/29-7/4 for community-acquired pneumonia.  She was treated with IV antibiotics and briefly diuresed but states she did not completely get over her pneumonia.  She continues to have dyspnea on exertion at home but worsening orthopnea.  She usually sleeps on 1 pillow but had to increase this to 3 pillows.  She denied any chest pain, palpitations, headaches, dizziness but endorsed lower extremity edema.  Patient was admitted to the hospitalist service and CXR was consistent with CHF.  BNP slightly elevated to 326. Patient has been receiving IV Lasix 60 mg twice daily for the past 4 days.  She has had a net I&O of -3.1 L since admission. Weight is down about 4 pounds since admission.  Cardiology was consulted today to help with managing her heart failure exacerbation.   Past Medical History:  Diagnosis Date   Anemia    hx of   Arthritis    knees   Diabetes mellitus without complication (HCC)    type 2   Fibromyalgia    HTN (hypertension)    Hx of colonic polyps    s/p partial colectomy   Hyperlipidemia    Joint pain    in knees and back spasms   Left leg swelling    wear compression hose   Liver cirrhosis  secondary to NASH (nonalcoholic steatohepatitis) ([place]) dx nov 2014   had enlarged spleen also    Low blood pressure    bp varies   Morbid obesity ([place])    Persistent atrial fibrillation ([place])    Pneumonia 10/2016   PONV (postoperative nausea and vomiting)    in past none recent   Portal hypertension ([place])    Sleep apnea    uses cpap setting of 14   Spleen enlarged    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 [facility])     Past Surgical History:  Procedure Laterality Date   BREAST LUMPECTOMY WITH RADIOACTIVE SEED LOCALIZATION Right 08/29/2017   Procedure: RIGHT BREAST LUMPECTOMY WITH RADIOACTIVE SEEDS LOCALIZATION;  Surgeon: Erroll Luna, MD;  Location: [facility];  Service: General;  Laterality: Right;   [place]  20 yrs ago   COLECTOMY  2011   COLONOSCOPY     COLONOSCOPY N/A 09/05/2018   Procedure: COLONOSCOPY;  Surgeon: [name], MD;  Location: [facility];  Service: Endoscopy;  Laterality: N/A;   DILATATION & CURETTAGE/HYSTEROSCOPY WITH MYOSURE N/A 05/06/2016   Procedure: DILATATION & CURETTAGE/HYSTEROSCOPY WITH MYOSURE WITH POLYPECTOMY;  Surgeon: Janyth Pupa, DO;  Location: [facility];  Service: Gynecology;  Laterality: N/A;   ESOPHAGOGASTRODUODENOSCOPY (EGD) WITH PROPOFOL N/A 09/04/2013   Procedure: ESOPHAGOGASTRODUODENOSCOPY (EGD) WITH PROPOFOL;  Surgeon: [name], MD;  Location: [facility];  Service: Endoscopy;  Laterality: N/A;   KNEE ARTHROSCOPY  yrs ago   bilateral, one done x 1, one done twice   POLYPECTOMY  09/05/2018   Procedure: POLYPECTOMY;  Surgeon: [name], MD;  Location: [facility];  Service: Endoscopy;;     Home Medications:  Prior to Admission medications   Medication Sig Start Date End Date Taking? Authorizing Provider  albuterol (PROVENTIL) (2.5 MG/3ML) 0.083% nebulizer solution Take 3 mLs (2.5 mg total) by nebulization every 6 (six) hours as needed for wheezing or shortness of breath. 03/22/21  Yes Dessa Phi, DO  bumetanide (BUMEX) 2 MG tablet Take 1 tablet (2 mg total) by mouth daily. 02/17/21  Yes Cleaver, Jossie Ng, NP  diltiazem (CARDIZEM CD) 360 MG 24 hr capsule Take 1 capsule (360 mg total) by mouth daily. 02/17/21  Yes Cleaver, Jossie Ng, NP  glimepiride (AMARYL) 1 MG tablet Take 1 mg by mouth daily with breakfast.   Yes [provider]  lactulose (CHRONULAC) 10 GM/15ML solution Take 30 mLs by mouth 3 (three) times daily. 03/17/21  Yes [provider]  lidocaine (LIDODERM) 5 % Place 1 patch onto the skin daily. Remove & Discard patch within 12 hours or as directed by MD Patient taking differently: Place 1 patch onto the skin daily as needed (pain). Remove & Discard patch within 12 hours or as directed by MD 01/24/21  Yes Mesner, Corene Cornea, MD  metoprolol tartrate (LOPRESSOR) 50 MG tablet Take 1 tablet (50 mg total) by mouth 2 (two) times daily. 02/17/21  Yes Cleaver, Jossie Ng, NP  potassium chloride (MICRO-K) 10 MEQ CR capsule Take 2 capsules (20 mEq total) by mouth daily. 03/22/21 06/20/21 Yes Dessa Phi, DO  spironolactone (ALDACTONE) 25 MG tablet Take 1 tablet (25 mg total) by mouth daily. 02/17/21  Yes Cleaver, Jossie Ng, NP  XIFAXAN 550 MG TABS tablet Take 550 mg by mouth 2 (two) times daily. 03/13/21  Yes [provider]  glimepiride (AMARYL) 1 MG tablet Take 1 tablet (1 mg total) by mouth daily with lunch. 01/24/21 03/18/21  Mesner, Corene Cornea, MD  Lancets [facility] DELICA PLUS [id number]) MISC Apply topically 3 (three) times daily. 04/10/20   [provider]  [facility] VERIO test strip 1 each 3 (three) times daily. 04/13/20   [provider]    Inpatient Medications: Scheduled Meds:  dapagliflozin propanediol  10 mg Oral Daily   diltiazem  360 mg Oral Daily   docusate sodium  100 mg Oral BID   furosemide  80 mg Intravenous TID   lactulose  20 g Oral TID   metoprolol tartrate  50 mg Oral BID   potassium chloride  40 mEq Oral Once   [START ON 04/02/2021] potassium  chloride  40 mEq Oral BID   rifaximin  550 mg Oral BID   sodium chloride flush  3 mL Intravenous Q12H   spironolactone  25 mg Oral Daily   Continuous Infusions:  PRN Meds: acetaminophen **OR** acetaminophen, albuterol, bisacodyl, hydrALAZINE, ondansetron **OR** ondansetron (ZOFRAN) IV, oxyCODONE, polyethylene glycol, traZODone  Allergies:    Allergies  Allergen Reactions   Celecoxib Other (See Comments)    "speech slurred" with celebrex   Shellfish Allergy Swelling and Rash    TINGLING AND SWELLING OF LIPS. LARGE WHELPS QUARTER-SIZE   Barium-Containing Compounds Other (See Comments)    TACHYCARDIA   Prednisone Other (See Comments)    TACHYCARDIA   Statins Other (See Comments)    AGGRAVATED FIBROMYALGIA   Fentanyl  Hallucinations    Ibuprofen Other (See Comments)    UNSPECIFIED SPECIFIC REACTION >> "DUE TO PLATELETS"   Tramadol     Hallucination    Tylenol [Acetaminophen] Other (See Comments)    UNSPECIFIED SPECIFIC REACTION >> "DUE TO PLATELETS"   Zetia [Ezetimibe]     Headache    Social History:   Social History   Socioeconomic History   Marital status: Married    Spouse name: Not on file   Number of children: Not on file   Years of education: Not on file   Highest education level: Not on file  Occupational History   Occupation: Merchant navy officer  Tobacco Use   Smoking status: Former    Packs/day: 1.00    Years: 3.00    Pack years: 3.00    Types: Cigarettes    Quit date: 09/19/1974    Years since quitting: 46.5   Smokeless tobacco: Never  Vaping Use   Vaping Use: Never used  Substance and Sexual Activity   Alcohol use: Yes    Comment: occ   Drug use: No   Sexual activity: Not on file  Other Topics Concern   Not on file  Social History Narrative   Lives in [place] with her husband.  Instructor for early childhood development.     Social Determinants of Health   Financial Resource Strain: Not on file  Food Insecurity: Not on file   Transportation Needs: Not on file  Physical Activity: Not on file  Stress: Not on file  Social Connections: Not on file  Intimate Partner Violence: Not on file    Family History:    Family History  Problem Relation Age of Onset   Heart failure Mother    Cancer Mother    Hyperlipidemia Mother    Congestive Heart Failure Mother    Stroke Mother    Lung cancer Father    Cancer Father    Cancer Brother    Hyperlipidemia Brother    Stroke Other      ROS:  Please see the history of present illness.   All other ROS reviewed and negative.     Physical Exam/Data:   Vitals:   04/01/21 0610 04/01/21 1000 04/01/21 1132 04/01/21 1644  BP: 99/69 (!) 103/49 108/61 125/68  Pulse: 89  76 65  Resp: 18  18 18   Temp: 98.4 F (36.9 C)  98.6 F (37 C) 97.8 F (36.6 C)  TempSrc: Oral  Oral Oral  SpO2: 98%  96% 97%  Weight: (!) 156.6 kg     Height:        Intake/Output Summary (Last 24 hours) at 04/01/2021 1647 Last data filed at 04/01/2021 8546 Gross per 24 hour  Intake 720 ml  Output 1000 ml  Net -280 ml   Last 3 Weights 04/01/2021 03/31/2021 03/30/2021  Weight (lbs) 345 lb 3.9 oz 348 lb 5.2 oz 349 lb 3.3 oz  Weight (kg) 156.6 kg 158 kg 158.4 kg     Body mass index is 59.26 kg/m.  General: Pleasant, morbidly obese elderly woman laying in bed in no acute distress HEENT: normal Lymph: no adenopathy Neck: no JVD Endocrine:  No thryomegaly Vascular: No carotid bruits; FA pulses 2+ bilaterally without bruits  Cardiac:  normal S1, S2; regular rate. Irregular with irregular; Systolic murmur present. Lungs: Decreased breath sounds throughout.  Mild bibasilar Rales.  No wheezing. Abd: soft, nontender, no hepatomegaly.  Ext: 1+ BLE pitting edema to the mid-calf Musculoskeletal:  No deformities,  BUE and BLE strength normal and equal Skin: warm and dry. Chronic hyperpigmented areas on BLE.  Neuro:  CNs 2-12 intact, no focal abnormalities noted Psych:  Normal affect   EKG:  The EKG  was personally reviewed and demonstrates: A. fib with incomplete RBBB and nonspecific T wave abnormalities. Telemetry:  Telemetry was personally reviewed and demonstrates: Afib   Relevant CV Studies: Echo 02/2020: EF 55 to 60%, no reginal wall motion abnormality, moderately enlarged RV.  Echo 03/20/2021: Technically difficult study due to body habitus.  EF 60 to 65%, mild LVH, moderately enlarged RV, severe LAE and large pleural effusion.   Laboratory Data:  High Sensitivity Troponin:   Recent Labs  Lab 03/18/21 0413 03/18/21 0510 03/18/21 0658  TROPONINIHS 14 13 13      Chemistry Recent Labs  Lab 03/29/21 1317 03/30/21 0409 03/31/21 0400  NA 137 139 139  K 3.5 3.3* 3.3*  CL 101 101 100  CO2 28 30 31   GLUCOSE 132* 138* 121*  BUN 14 16 14   CREATININE 1.02* 1.01* 1.07*  CALCIUM 9.1 8.9 9.1  GFRNONAA >60 >60 58*  ANIONGAP 8 8 8     Recent Labs  Lab 03/29/21 1317  PROT 7.4  ALBUMIN 3.2*  AST 26  ALT 17  ALKPHOS 78  BILITOT 4.4*   Hematology Recent Labs  Lab 03/29/21 1317 03/30/21 0409 03/31/21 0400  WBC 5.8 4.5 4.2  RBC 3.73* 3.31* 3.43*  HGB 11.7* 10.2* 10.5*  HCT 36.0 31.6* 32.4*  MCV 96.5 95.5 94.5  MCH 31.4 30.8 30.6  MCHC 32.5 32.3 32.4  RDW 15.9* 15.9* 15.9*  PLT DCLMP 34* 31*   BNP Recent Labs  Lab 03/29/21 1317  BNP 326.1*    DDimer No results for input(s): DDIMER in the last 168 hours.   Radiology/Studies:  DG Chest Port 1 View  Result Date: 03/29/2021 CLINICAL DATA:  Shortness of breath. EXAM: PORTABLE CHEST 1 VIEW COMPARISON:  03/17/2021 FINDINGS: 1345 hours. The cardio pericardial silhouette is enlarged. Vascular congestion. Right base better aerated but there is persistent left base collapse/consolidation with probable small left effusion. The visualized bony structures of the thorax show no acute abnormality. Telemetry leads overlie the chest. IMPRESSION: Cardiomegaly with vascular congestion. Persistent retrocardiac left base  collapse/consolidation with small left effusion. Electronically Signed   By: Misty Stanley M.D.   On: 03/29/2021 14:18     Assessment and Plan:   Acute on chronic diastolic heart failure: Recent ECHO 03/2021 showed EF 60-65%, mild LVH. Patient presented with progressive shortness of breath and LE edema. Labs and imaging consistent with heart failure exacerbation. Patient has not had adequate response to IV Lasix 60 mg twice daily.  Net I&O of -3.1L since admission with weight only down about 4 pounds. We will escalate diuretic regimen and optimize GDMT. --Increase IV Lasix to 80 mg 3 times daily --KCl 40 mEq x 1 dose today, continue KCl 40 mEq twice daily --Start Farxiga 10 mg daily --Continue metoprolol 50 mg twice daily --Continue spironolactone 25 mg daily --Trend BMP, electrolytes  Persistent A. Fib: Stable. Patient not on anticoagulation due to chronic thrombocytopenia.  -- Continue diltiazem 360 mg daily  -- Continue metoprolol 50 mg twice daily  -- Continue telemetry  -- Monitor electrolyte  HTN: BP stable with SBP in the 90s to 120s.  --Continue diltiazem and metoprolol as above  T2DM: Well-controlled with a recent A1c of 5.4: Start Farxiga 10 mg daily Cirrhosis/NASH: Management per primary team Chronic thrombocytopenia: Platelet of  31 today. Morbid obesity/OHS: Continue CPAP   Risk Assessment/Risk Scores:   New York Heart Association (NYHA) Functional Class NYHA Class III  CHA2DS2-VASc Score = 4 This indicates a 4.8% annual risk of stroke. [name] score is based upon: CHF History: Yes HTN History: Yes Diabetes History: Yes Stroke History: No Vascular Disease History: No Age Score: 0 Gender Score: 1    For questions or updates, please contact [place] Please consult [email or web address] for contact info under    Signed, Lacinda Axon, MD  04/01/2021 4:47 PM  History and all data above reviewed.   The patient has had worsening dyspnea since 2019.   She has chronic DOE and PND.  She comes to the hospital when it gets to the point that she cannot breath at night.  She watches her salt but does drink too much fluid.  She weighs herself and does take extra diuretic with weight gain.  However, recently her weight has been increasing.  She has had mild diuresis to IV Lasix in the hospital but still has continues SOB and hypoxemia if on room air.  We are called to help with diuresis and med management.  Patient examined.  I agree with the findings as above.  The patient exam reveals COR:  Irregular   ,  Lungs: Decreased breath sounds at the bases  ,  Abd: Positive bowel sounds, no rebound no guarding , Ext Severe edema  .  All available labs, radiology testing, previous records reviewed. Agree with documented assessment and plan.   Acute on chronic diastolic HF:  I am going to increase her Lasix to 80 mg tid.  (Also will give extra potassium and check again in the AM.)  We talked about fluid restriction.  I will also start [place].  She was on Jardiance but had some UTIs.  She is willing to try  this again however.  We will stop her Amaryl.  Continue other meds as listed.   Jeneen Rinks Devetta Hagenow  6:40 PM  04/01/2021

## 2021-04-01 NOTE — Progress Notes (Signed)
RT note. Patient placed on cpap w/ home nasal pillows. 2L bled in line

## 2021-04-01 NOTE — Progress Notes (Signed)
SATURATION QUALIFICATIONS: (This note is used to comply with regulatory documentation for home oxygen)  Patient Saturations on Room Air at Rest = 94%  Patient Saturations on Room Air while Ambulating = 90%  Patient Saturations on 2 Liters of oxygen while Ambulating = 92%  Please briefly explain why patient needs home oxygen:  Verdene Lennert, PT, DPT  Acute Rehabilitation Ortho Tech Supervisor [phone] pager [phone]) [phone] office

## 2021-04-01 NOTE — Progress Notes (Signed)
Physical Therapy Treatment Patient Details Name: Hannah Khan MRN: [id number] DOB: [birth year] Today's Date: 04/01/2021    History of Present Illness Hannah Khan is a 65 y.o. female presenting 03/29/21 with SOB.  She was last hospitalized from 6/29-7/4 for CAP, treated for 5 days with Rocephin/Azithromycin.  She was also treated for RUE cellulitis and acute on chronic diastolic CHF during that hospitalization.  Since d/c, she feels like she never quite recovered from the PNA.  Her SOB got worse and worse.  She was unable to elevate her feet since she had orthopnea.  Pt dx this admission with acute on chronic CHF.  PMH -- with medical history significant of DM; afib not on AC due to chronic thrombocytopenia; HTN; chronic diastolic CHF; morbid obesity; OSA on CPAP; and NASH cirrhosis    PT Comments    Pt was able to walk down the hallway for three bouts with RW.  O2 sats were 90 or higher on RA despite 3/4 DOE.  Pt struggles with endurance for everyday activities.  Pt would benefit from a round of home therapy with transition to more intensive outpatient therapy once her endurance is built up a bit more.  PT will continue to follow acutely for safe mobility progression.  Bring bariatric rollator next session.    Follow Up Recommendations  Home health PT;Other (comment) (pt requests NOT [facility])     Equipment Recommendations  None recommended by PT    Recommendations for Other Services       Precautions / Restrictions Precautions Precaution Comments: monitor sats    Mobility  Bed Mobility Overal bed mobility: Modified Independent             General bed mobility comments: HOB elevated, used rail    Transfers Overall transfer level: Needs assistance Equipment used: Rolling walker (2 wheeled) Transfers: Sit to/from Stand Sit to Stand: Supervision         General transfer comment: supervision for safety  Ambulation/Gait Ambulation/Gait assistance: Supervision Gait  Distance (Feet): 60 Feet (60'x2, 120'x1) Assistive device: Rolling walker (2 wheeled) Gait Pattern/deviations: Step-through pattern;Shuffle;Trunk flexed     General Gait Details: cues for upright posture, chair to follow for increased safety, two seated rest breaks.   Stairs             Wheelchair Mobility    Modified Rankin (Stroke Patients Only)       Balance Overall balance assessment: No apparent balance deficits (not formally assessed)                                          Cognition Arousal/Alertness: Awake/alert Behavior During Therapy: WFL for tasks assessed/performed Overall Cognitive Status: Within Functional Limits for tasks assessed                                        Exercises      General Comments General comments (skin integrity, edema, etc.): Pt with 3/4 DOE, see ambulatory sat note for details.  Pt subjectively felt better with 2 L O2 [place] during gait.      Pertinent Vitals/Pain Pain Assessment: No/denies pain    Home Living                      Prior Function  PT Goals (current goals can now be found in the care plan section) Acute Rehab PT Goals Patient Stated Goal: to get stronger so she can go on a family trip to [place] at the end of the month. Progress towards PT goals: Progressing toward goals    Frequency    Min 3X/week      PT Plan Current plan remains appropriate    Co-evaluation              AM-PAC PT "6 Clicks" Mobility   Outcome Measure  Help needed turning from your back to your side while in a flat bed without using bedrails?: None Help needed moving from lying on your back to sitting on the side of a flat bed without using bedrails?: A Little Help needed moving to and from a bed to a chair (including a wheelchair)?: A Little Help needed standing up from a chair using your arms (e.g., wheelchair or bedside chair)?: A Little Help needed to walk in hospital  room?: A Little Help needed climbing 3-5 steps with a railing? : A Little 6 Click Score: 19    End of Session Equipment Utilized During Treatment: Oxygen Activity Tolerance: Patient tolerated treatment well Patient left: in bed;Other (comment);with nursing/sitter in room (seated EOB with RN tech) Nurse Communication: Mobility status PT Visit Diagnosis: Other abnormalities of gait and mobility (R26.89)     Time: [phone] (MD came in during session, did not charge for his time) PT Time Calculation (min) (ACUTE ONLY): 55 min  Charges:  $Gait Training: 23-37 mins $Therapeutic Activity: 8-22 mins                    Verdene Lennert, PT, DPT  Acute Rehabilitation Ortho Tech Supervisor [phone] pager [phone]) [phone] office

## 2021-04-02 ENCOUNTER — Inpatient Hospital Stay (HOSPITAL_COMMUNITY): Payer: Medicare Other

## 2021-04-02 ENCOUNTER — Other Ambulatory Visit (HOSPITAL_COMMUNITY): Payer: Self-pay

## 2021-04-02 DIAGNOSIS — I5033 Acute on chronic diastolic (congestive) heart failure: Secondary | ICD-10-CM | POA: Diagnosis not present

## 2021-04-02 LAB — BLOOD GAS, ARTERIAL
Acid-Base Excess: 6.4 mmol/L — ABNORMAL HIGH (ref 0.0–2.0)
Bicarbonate: 30.4 mmol/L — ABNORMAL HIGH (ref 20.0–28.0)
Drawn by: 39899
FIO2: 24
O2 Saturation: 95.7 %
Patient temperature: 37
pCO2 arterial: 44.3 mmHg (ref 32.0–48.0)
pH, Arterial: 7.451 — ABNORMAL HIGH (ref 7.350–7.450)
pO2, Arterial: 77.5 mmHg — ABNORMAL LOW (ref 83.0–108.0)

## 2021-04-02 LAB — BASIC METABOLIC PANEL
Anion gap: 10 (ref 5–15)
Anion gap: 9 (ref 5–15)
BUN: 16 mg/dL (ref 8–23)
BUN: 15 mg/dL (ref 8–23)
CO2: 29 mmol/L (ref 22–32)
CO2: 29 mmol/L (ref 22–32)
Calcium: 8.9 mg/dL (ref 8.9–10.3)
Calcium: 8.9 mg/dL (ref 8.9–10.3)
Chloride: 98 mmol/L (ref 98–111)
Chloride: 99 mmol/L (ref 98–111)
Creatinine, Ser: 1.02 mg/dL — ABNORMAL HIGH (ref 0.44–1.00)
Creatinine, Ser: 1.2 mg/dL — ABNORMAL HIGH (ref 0.44–1.00)
GFR, Estimated: >60 mL/min (ref >60)
GFR, Estimated: 50 mL/min — ABNORMAL LOW (ref >60)
Glucose, Bld: 136 mg/dL — ABNORMAL HIGH (ref 70–99)
Glucose, Bld: 143 mg/dL — ABNORMAL HIGH (ref 70–99)
Potassium: 3.4 mmol/L — ABNORMAL LOW (ref 3.5–5.1)
Potassium: 3.7 mmol/L (ref 3.5–5.1)
Sodium: 137 mmol/L (ref 135–145)
Sodium: 137 mmol/L (ref 135–145)

## 2021-04-02 LAB — CBC
HCT: 31.9 % — ABNORMAL LOW (ref 36.0–46.0)
Hemoglobin: 10.3 g/dL — ABNORMAL LOW (ref 12.0–15.0)
MCH: 30.6 pg (ref 26.0–34.0)
MCHC: 32.3 g/dL (ref 30.0–36.0)
MCV: 94.7 fL (ref 80.0–100.0)
Platelets: 34 10*3/uL — ABNORMAL LOW (ref 150–400)
RBC: 3.37 MIL/uL — ABNORMAL LOW (ref 3.87–5.11)
RDW: 15.8 % — ABNORMAL HIGH (ref 11.5–15.5)
WBC: 4.4 10*3/uL (ref 4.0–10.5)
nRBC: 0 % (ref 0.0–0.2)

## 2021-04-02 LAB — GLUCOSE, CAPILLARY
Glucose-Capillary: 181 mg/dL — ABNORMAL HIGH (ref 70–99)
Glucose-Capillary: 140 mg/dL — ABNORMAL HIGH (ref 70–99)

## 2021-04-02 LAB — MAGNESIUM: Magnesium: 1.8 mg/dL (ref 1.7–2.4)

## 2021-04-02 LAB — PHOSPHORUS: Phosphorus: 3.3 mg/dL (ref 2.5–4.6)

## 2021-04-02 LAB — AMMONIA: Ammonia: 96 umol/L — ABNORMAL HIGH (ref 9–35)

## 2021-04-02 MED ORDER — MAGNESIUM SULFATE 2 GM/50ML IV SOLN
2.0000 g | Freq: Once | INTRAVENOUS | Status: AC
  Administered 2021-04-02: 2 g via INTRAVENOUS
  Filled 2021-04-02: qty 50

## 2021-04-02 MED ORDER — LACTULOSE 10 GM/15ML PO SOLN
30.0000 g | Freq: Three times a day (TID) | ORAL | Status: DC
  Administered 2021-04-02 – 2021-04-04 (×5): 30 g via ORAL
  Filled 2021-04-02 (×6): qty 45

## 2021-04-02 MED ORDER — MUSCLE RUB 10-15 % EX CREA
TOPICAL_CREAM | CUTANEOUS | Status: DC | PRN
  Filled 2021-04-02: qty 85

## 2021-04-02 MED ORDER — BIOTENE DRY MOUTH MT LIQD: 15.0000 mL | OROMUCOSAL | Status: DC | PRN

## 2021-04-02 NOTE — TOC Initial Note (Addendum)
Transition of Care [facility]) - Initial/Assessment Note    Patient Details  Name: Hannah Khan MRN: [id number] Date of Birth: 11-Sep-1956  [facility]) CM/SW Contact:    Marilu Favre, RN Phone Number: 04/02/2021, 10:28 AM  Clinical Narrative:                 Spoke to patient at bedside. Confirmed face sheet information.  Patient from home with husband.   Patient has Rolator , 3 in 1 , transfer bench, CPAP, and shower chair at home.   Patient in agreement with home health PT. She has had [facility] in past and prefers a different agency this time.   NCM provided medicare.gov list of home health agencies. Patient's first choice is [facility] and second is [facility]. After that no preference   NCM gave Ramond Marrow with [facility] referral, Ramond Marrow will submit clinicals to her office for determination. Await call back.   Kenzie with [facility] returned call , they are unable to accept referral at this time.   Caled Amy with [facility] ( [facility]) and left message . Await call back   Have not heard back from Amy,. Called [place] office spoke with [facility] they are not in network with patient's insurance.   Spoke to [place] with [facility] , they are not in network with patient's insurance.   [facility] , called left message for [place]. Erica with [facility] accepted referral   1559 Went to patient's room to discuss above, patient not in room   Expected Discharge Plan: [place] Barriers to Discharge: Continued Medical Work up (Lasix IV)   Patient Goals and CMS Choice Patient states their goals for this hospitalization and ongoing recovery are:: to return to home CMS Medicare.gov Compare Post Acute Care list provided to:: Patient Choice offered to / list presented to : Patient  Expected Discharge Plan and Services Expected Discharge Plan: [place]   Discharge Planning Services: CM Consult Post Acute Care Choice: [place]  arrangements for the past 2 months: Single Family Home                 DME Arranged: N/A DME Agency: NA       HH Arranged: PT [place] Agency: [place] ([place]) Date HH Agency Contacted: 04/02/21 Time [place]: 1027 Representative spoke with at [place]: Ramond Marrow reviewing  Prior Living Arrangements/Services Living arrangements for the past 2 months: [place] with:: Spouse Patient language and need for interpreter reviewed:: Yes Do you feel safe going back to the place where you live?: Yes      Need for Family Participation in Patient Care: Yes (Comment) Care giver support system in place?: Yes (comment) Current home services: DME Criminal Activity/Legal Involvement Pertinent to Current Situation/Hospitalization: No - Comment as needed  Activities of Daily Living Home Assistive Devices/Equipment: None ADL Screening (condition at time of admission) Patient's cognitive ability adequate to safely complete daily activities?: Yes Is the patient deaf or have difficulty hearing?: No Does the patient have difficulty seeing, even when wearing glasses/contacts?: No Does the patient have difficulty concentrating, remembering, or making decisions?: No Patient able to express need for assistance with ADLs?: Yes Does the patient have difficulty dressing or bathing?: Yes Independently performs ADLs?: Yes (appropriate for developmental age) Does the patient have difficulty walking or climbing stairs?: Yes Weakness of Legs: Both Weakness of Arms/Hands: Both  Permission Sought/Granted   Permission granted  to share information with : No              Emotional Assessment Appearance:: Appears stated age Attitude/Demeanor/Rapport: Engaged Affect (typically observed): Accepting Orientation: : Oriented to Self, Oriented to Place, Oriented to  Time, Oriented to Situation Alcohol / Substance Use: Not Applicable Psych Involvement: No (comment)  Admission  diagnosis:  Shortness of breath [R06.02] Thrombocytopenia (HCC) [D69.6] Chronic atrial fibrillation (HCC) [I48.20] BMI 60.0-69.9, adult (HCC) [Z68.44] Liver cirrhosis secondary to NASH ([place]) [K75.81, K74.60] Acute on chronic diastolic CHF (congestive heart failure) (HCC) [I50.33] Acute on chronic combined systolic and diastolic CHF (congestive heart failure) (HCC) [I50.43] Patient Active Problem List   Diagnosis Date Noted   CAP (community acquired pneumonia) 03/18/2021   Atrial fibrillation with RVR ([place]) 01/10/2021   Acute encephalopathy 01/10/2021   Secondary hypercoagulable state ([place]) 06/23/2020   Streptococcal bacteremia 03/16/2020   Chronic venous stasis dermatitis of both lower extremities 03/16/2020   A-fib ([place]) 03/15/2020   Acute lower UTI 04/26/2018   Liver cirrhosis secondary to NASH (nonalcoholic steatohepatitis) ([place]) 04/26/2018   Hyperlipidemia 04/26/2018   Type 2 diabetes mellitus with other specified complication ([place])    Cellulitis of right lower extremity 03/15/2018   Severe sepsis ([place]) 03/14/2018   Acute respiratory failure with hypoxia ([place]) 11/01/2016   Community acquired pneumonia of right lower lobe of lung 10/31/2016   Muscular abdominal pain in right upper quadrant 07/11/2016   Acute on chronic diastolic CHF (congestive heart failure) ([place]) 07/10/2016   Thrombocytopenia ([place]) 07/10/2016   Permanent atrial fibrillation ([place]) 01/30/2016   Type 2 diabetes mellitus without complication, without long-term current use of insulin ([place]) 01/30/2016   Acute on chronic combined systolic and diastolic CHF (congestive heart failure) ([place]) 01/26/2016   Morbid obesity- 12/10/2015   Persistent atrial fibrillation ([place]) 12/08/2015   [place] 01/31/2015   Aortic heart murmur 01/31/2015   Pancytopenia ([place]) 07/23/2013   Obstructive sleep apnea 08/07/2007   Essential hypertension 08/07/2007   ALLERGIC RHINITIS 08/07/2007   PCP:  Lujean Amel, MD Pharmacy:    [id number] [place] [address] [address]Phone: [phone]:: [phone] [facility] [address] [place]Phone: [phone]: [phone]    Social Determinants of Health (SDOH) Interventions    Readmission Risk Interventions No flowsheet data found.

## 2021-04-02 NOTE — Progress Notes (Signed)
Pt c/o right lower back pain that radiates to hip. Pt describes pain as constant, sharp pain that started all of a sudden. Pt rates the pain 8/10. Offered oxycodone but pt declined. She states that she does not like how oxycodone makes her feel. Informed pt Tylenol is the other med available on her med list. Pt states Tylenol bother her liver. Pt has several other medication allergies. Pt would like to repositioning, heat pack and muscle cream first before trying any pain medication. Will continue to monitor.

## 2021-04-02 NOTE — Progress Notes (Signed)
Patient reports that she felt a little flush in the face and unable to formulate her words, pain at PIV site as well as tingling down her arms right after RN adm PIV mag. Med stopped. VSS. MD notified. RAPID RN called for further assist.

## 2021-04-02 NOTE — Progress Notes (Signed)
Patient states she has trouble formulating her words and had burning at her IV site during Magnesium infusion.  No Focal deficits noted, she is generally weak and slow to speak.  Dr Tyrell Antonio at bedside to assess patient.  12 lead EKG done and Stat head CT done.   BP 123/62  HR 55  RR 16 O2 sat 97% on 2L [place]

## 2021-04-02 NOTE — Progress Notes (Signed)
Negative PROGRESS NOTE    Hannah Khan  GSU:[id number] DOB: 1956/09/02 DOA: 03/29/2021 PCP: Lujean Amel, MD   Brief Narrative: 65 year old-year-old female with history of chronic diastolic heart failure, morbid obesity, BMI of 60, OSA, NASH/cirrhosis, chronic thrombocytopenia, diabetes type 2 presents to the ED complaining of worsening shortness of breath.  -Recently hospitalized from 6/29 until 7/4 for community-acquired pneumonia, treated with IV antibiotics, was also briefly diuresed during that time. -She reported ongoing dyspnea on exertion for weeks however worse in the last 3 days, oxygen dropped to 70s and in the mid 80s prior to admission.  X-ray consistent with CHF.    Assessment & Plan:   Principal Problem:   Acute on chronic diastolic CHF (congestive heart failure) (HCC) Active Problems:   Obstructive sleep apnea   Essential hypertension   Persistent atrial fibrillation (HCC)   Morbid obesity-   Type 2 diabetes mellitus with other specified complication (HCC)   Liver cirrhosis secondary to NASH (nonalcoholic steatohepatitis) (HCC)   Hyperlipidemia  1-Acute on chronic diastolic heart failure exacerbation -Suspect related to diet indiscretion -Continue with IV Lasix twice daily and spironolactone -Diet education -Wean off oxygen as tolerated -Weight : 364----349--- 348--345-*---341 -Cardiology consulted. Started on Farxiga.  -Plan  to continue with IV lasix.   2-AMS;  Develops Facial flush, slurred speech, difficulty formulating sentences while getting IV magnesium.  Plan to proceed with Stat CT head. ABG. B-met, Ammonia level.  IV magnesium Stopped.  Glucose was normal, Vitals stable.  EKG HR 60, A fib.  SOB, will order PRN nebulizer.   morbid obesity, BMI 60 Failure/OHS Continue with CPAP  Hypertension: Continue with Cardizem and Lopressor  Type 2 diabetes:  A1c 5.4 Resume Amaryl at discharge. Continue with correctional insulin while  inpatient  Chronic A. fib: Continue with Lopressor and Cardizem. Not on  anticoagulation due to thrombocytopenia.  NASH/cirrhosis Chronic thrombocytopenia Continue with rifaximin, diuretics lactulose  Chronic Thrombocytopenia; stable.  Hypokalemia; received potassium. Monitor Bmet.   Estimated body mass index is 58.58 kg/m as calculated from the following:   Height as of this encounter: 5' 4"  (1.626 m).   Weight as of this encounter: 154.8 kg.   DVT prophylaxis: SCD, not anticoagulation due to thrombocytopenia.  Code Status: Full Code Family Communication: Care discussed with patient. Husband at bedside.  Disposition Plan:  Status is: Inpatient  Remains inpatient appropriate because: IV medications needed.   Dispo: The patient is from: Home              Anticipated d/c is to: Home              Patient currently is not medically stable to d/c.   Difficult to place patient No        Consultants:  None  Procedures:  None  Antimicrobials:    Subjective: Patient this am was feeling well, breathing ok.  I was called this afternoon, patient develops Facial flush, difficulty formulating sentences, some slurred speech.  She also mention, some SOB.   Objective: Vitals:   04/01/21 2351 04/02/21 0536 04/02/21 1230 04/02/21 1401  BP: 120/63 116/78 (!) 114/56 123/62  Pulse: 68 72 64 (!) 55  Resp: 18 18 16 16   Temp: 97.8 F (36.6 C) 97.9 F (36.6 C) 98.1 F (36.7 C) 97.8 F (36.6 C)  TempSrc: Oral Oral Oral Oral  SpO2: 95% 96% 95% 97%  Weight:  (!) 154.8 kg    Height:        Intake/Output Summary (Last 24  hours) at 04/02/2021 1452 Last data filed at 04/02/2021 0243 Gross per 24 hour  Intake 240 ml  Output 2300 ml  Net -2060 ml    Filed Weights   03/31/21 0500 04/01/21 0610 04/02/21 0536  Weight: (!) 158 kg (!) 156.6 kg (!) 154.8 kg    Examination:  General exam: Lethargic Respiratory system: BL crackles.  Cardiovascular system: S 1, S 2  IRR Gastrointestinal system: BS present, soft, nt Central nervous system: sleepy, speech slow, moves upper extremities, passively move lower extremities. Tongue midline.  Extremities:trace edema  Data Reviewed: I have personally reviewed following labs and imaging studies  CBC: Recent Labs  Lab 03/29/21 1317 03/30/21 0409 03/31/21 0400 04/02/21 0345  WBC 5.8 4.5 4.2 4.4  HGB 11.7* 10.2* 10.5* 10.3*  HCT 36.0 31.6* 32.4* 31.9*  MCV 96.5 95.5 94.5 94.7  PLT DCLMP 34* 31* 34*    Basic Metabolic Panel: Recent Labs  Lab 03/29/21 1317 03/30/21 0409 03/31/21 0400 04/01/21 1618 04/02/21 0345  NA 137 139 139 137 137  K 3.5 3.3* 3.3* 4.4 3.4*  CL 101 101 100 100 98  CO2 28 30 31 28 29   GLUCOSE 132* 138* 121* 112* 136*  BUN 14 16 14 15 16   CREATININE 1.02* 1.01* 1.07* 1.00 1.02*  CALCIUM 9.1 8.9 9.1 9.1 8.9  MG  --   --   --   --  1.8  PHOS  --   --   --   --  3.3    GFR: Estimated Creatinine Clearance: 82.2 mL/min (A) (by C-G formula based on SCr of 1.02 mg/dL (H)). Liver Function Tests: Recent Labs  Lab 03/29/21 1317  AST 26  ALT 17  ALKPHOS 78  BILITOT 4.4*  PROT 7.4  ALBUMIN 3.2*    No results for input(s): LIPASE, AMYLASE in the last 168 hours. Recent Labs  Lab 03/29/21 1317  AMMONIA 49*    Coagulation Profile: Recent Labs  Lab 03/29/21 1317  INR 1.2    Cardiac Enzymes: No results for input(s): CKTOTAL, CKMB, CKMBINDEX, TROPONINI in the last 168 hours. BNP (last 3 results) No results for input(s): PROBNP in the last 8760 hours. HbA1C: No results for input(s): HGBA1C in the last 72 hours. CBG: Recent Labs  Lab 04/02/21 0727 04/02/21 1419  GLUCAP 181* 140*   Lipid Profile: No results for input(s): CHOL, HDL, LDLCALC, TRIG, CHOLHDL, LDLDIRECT in the last 72 hours. Thyroid Function Tests: No results for input(s): TSH, T4TOTAL, FREET4, T3FREE, THYROIDAB in the last 72 hours. Anemia Panel: No results for input(s): VITAMINB12, FOLATE,  FERRITIN, TIBC, IRON, RETICCTPCT in the last 72 hours. Sepsis Labs: No results for input(s): PROCALCITON, LATICACIDVEN in the last 168 hours.  Recent Results (from the past 240 hour(s))  SARS CORONAVIRUS 2 (TAT 6-24 HRS) Nasopharyngeal Nasopharyngeal Swab     Status: None   Collection Time: 03/29/21  1:20 PM   Specimen: Nasopharyngeal Swab  Result Value Ref Range Status   SARS Coronavirus 2 NEGATIVE NEGATIVE Final    Comment: (NOTE) SARS-CoV-2 target nucleic acids are NOT DETECTED.  The SARS-CoV-2 RNA is generally detectable in upper and lower respiratory specimens during the acute phase of infection. Negative results do not preclude SARS-CoV-2 infection, do not rule out co-infections with other pathogens, and should not be used as the sole basis for treatment or other patient management decisions. Negative results must be combined with clinical observations, patient history, and epidemiological information. The expected result is Negative.  Fact Sheet for Patients:  SugarRoll.be  Fact Sheet for Healthcare Providers: https://www.woods-mathews.com/  This test is not yet approved or cleared by the [place] FDA and  has been authorized for detection and/or diagnosis of SARS-CoV-2 by FDA under an Emergency Use Authorization (EUA). This EUA will remain  in effect (meaning this test can be used) for the duration of the COVID-19 declaration under Se ction 564(b)(1) of the Act, 21 U.S.C. section 360bbb-3(b)(1), unless the authorization is terminated or revoked sooner.  Performed at [facility], [place] [address], [place], [place] [address]           Radiology Studies: No results found.      Scheduled Meds:  dapagliflozin propanediol  10 mg Oral Daily   diltiazem  360 mg Oral Daily   docusate sodium  100 mg Oral BID   furosemide  80 mg Intravenous TID   lactulose  20 g Oral TID   metoprolol tartrate  50 mg Oral BID    potassium chloride  40 mEq Oral BID   rifaximin  550 mg Oral BID   sodium chloride flush  3 mL Intravenous Q12H   spironolactone  25 mg Oral Daily   Continuous Infusions:   LOS: 3 days    Time spent: 35 minutes.     Elmarie Shiley, MD [facility]   If 7PM-7AM, please contact night-coverage [email or web address]  04/02/2021, 2:52 PM

## 2021-04-02 NOTE — Progress Notes (Addendum)
Progress Note  Patient Name: Hannah Khan Date of Encounter: 04/02/2021  [facility] Cardiologist: Skeet Latch, MD   Subjective   Patient was evaluated at bedside after just waking up. Reports she slept fine on CPAP.  Shortness of breath is improved. She denies any chest pain or palpitations.  Inpatient Medications    Scheduled Meds:  dapagliflozin propanediol  10 mg Oral Daily   diltiazem  360 mg Oral Daily   docusate sodium  100 mg Oral BID   furosemide  80 mg Intravenous TID   lactulose  20 g Oral TID   metoprolol tartrate  50 mg Oral BID   potassium chloride  40 mEq Oral BID   rifaximin  550 mg Oral BID   sodium chloride flush  3 mL Intravenous Q12H   spironolactone  25 mg Oral Daily   Continuous Infusions:  magnesium sulfate bolus IVPB     PRN Meds: acetaminophen **OR** acetaminophen, albuterol, bisacodyl, hydrALAZINE, Muscle Rub, ondansetron **OR** ondansetron (ZOFRAN) IV, oxyCODONE, polyethylene glycol, traZODone   Vital Signs    Vitals:   04/01/21 1132 04/01/21 1644 04/01/21 2351 04/02/21 0536  BP: 108/61 125/68 120/63 116/78  Pulse: 76 65 68 72  Resp: 18 18 18 18   Temp: 98.6 F (37 C) 97.8 F (36.6 C) 97.8 F (36.6 C) 97.9 F (36.6 C)  TempSrc: Oral Oral Oral Oral  SpO2: 96% 97% 95% 96%  Weight:    (!) 154.8 kg  Height:        Intake/Output Summary (Last 24 hours) at 04/02/2021 0831 Last data filed at 04/02/2021 0243 Gross per 24 hour  Intake 240 ml  Output 2300 ml  Net -2060 ml   Last 3 Weights 04/02/2021 04/01/2021 03/31/2021  Weight (lbs) 341 lb 4.4 oz 345 lb 3.9 oz 348 lb 5.2 oz  Weight (kg) 154.8 kg 156.6 kg 158 kg      Telemetry    A. fib with HR in the 60s to 80s- Personally Reviewed  ECG    No new EKGs.  Physical Exam   GEN: Morbidly obese, pleasant elderly woman laying comfortably in bed. NAD.  Neck: No JVD Cardiac: Regular rate.  Irregularly irregular rhythm. II/VI systolic murmur.  Respiratory: Decreased breath  sounds throughout. Distant rales in the bases. GI: Soft, nontender, non-distended.  Normal BS. MS: No edema; No deformity. Neuro:  A&Ox3.  No focal neuro deficits. Psych: Normal affect   Labs    High Sensitivity Troponin:   Recent Labs  Lab 03/18/21 0413 03/18/21 0510 03/18/21 0658  TROPONINIHS 14 13 13       Chemistry Recent Labs  Lab 03/29/21 1317 03/30/21 0409 03/31/21 0400 04/01/21 1618 04/02/21 0345  NA 137   < > 139 137 137  K 3.5   < > 3.3* 4.4 3.4*  CL 101   < > 100 100 98  CO2 28   < > 31 28 29   GLUCOSE 132*   < > 121* 112* 136*  BUN 14   < > 14 15 16   CREATININE 1.02*   < > 1.07* 1.00 1.02*  CALCIUM 9.1   < > 9.1 9.1 8.9  PROT 7.4  --   --   --   --   ALBUMIN 3.2*  --   --   --   --   AST 26  --   --   --   --   ALT 17  --   --   --   --  ALKPHOS 78  --   --   --   --   BILITOT 4.4*  --   --   --   --   GFRNONAA >60   < > 58* >60 >60  ANIONGAP 8   < > 8 9 10    < > = values in this interval not displayed.     Hematology Recent Labs  Lab 03/30/21 0409 03/31/21 0400 04/02/21 0345  WBC 4.5 4.2 4.4  RBC 3.31* 3.43* 3.37*  HGB 10.2* 10.5* 10.3*  HCT 31.6* 32.4* 31.9*  MCV 95.5 94.5 94.7  MCH 30.8 30.6 30.6  MCHC 32.3 32.4 32.3  RDW 15.9* 15.9* 15.8*  PLT 34* 31* 34*    BNP Recent Labs  Lab 03/29/21 1317  BNP 326.1*     DDimer No results for input(s): DDIMER in the last 168 hours.   Radiology    No results found.  Cardiac Studies   No new studies today.  Patient Profile     66 y.o. female with a hx of HFpEF, chronic A. fib, morbid obesity (BMI 60), HTN, OSA on CPAP, T2DM, NASH/cirrhosis, and HLD who is being seen 04/01/2021 for management of her acute on chronic diastolic HF.   Assessment & Plan    Acute on chronic diastolic heart failure: Recent ECHO 03/2021 showed EF 60-65%, mild LVH.  Escalated diuretic regimen yesterday with adequate response overnight. UOP of 2.3 L with a net I&O of 2 L over the last 24 hours. Weight is down 4  kg to 154 kg. Unknown dry weight but lowest weight found on chart was 148 kg in May. Kidney function remained stable. Patient feeling better and satting well on room air.  Will consider switching back to Bumex before discharge for better gut absorption. --Continue IV Lasix 80 mg TID -- Continue KCl 40 mEq BID -- Continue Farxiga 10 mg daily -- Continue metoprolol 50 mg BID -- Continue spironolactone 25 mg daily -- Trend BMP, electrolytes -- Strict I&O's, daily weight --Patient did not qualify for home O2   Persistent A. Fib: Stable with HR in the 60s to 80s. Patient not on anticoagulation due to chronic thrombocytopenia.             -- Continue diltiazem 360 mg daily             -- Continue metoprolol 50 mg twice daily             -- Continue telemetry             -- Monitor electrolyte   HTN: BP remained stable with SBP in the 100s-120s overnight.             -- Continue diltiazem and metoprolol as above   T2DM: Well-controlled with a recent A1c of 5.4: Continue Farxiga 10 mg daily Cirrhosis/NASH: Management per primary team Chronic thrombocytopenia: Platelet of 34 today. Morbid obesity/OHS: Continue CPAP     For questions or updates, please contact [place] Please consult [email or web address] for contact info under        Signed, Lacinda Axon, MD  04/02/2021, 8:31 AM     History and all data above reviewed.  Patient examined.  I agree with the findings as above.  Breathing better without O2.  The patient exam reveals COR: Irregular  ,  Lungs: Clear  ,  Abd: Positive bowel sounds, no rebound no guarding, Ext Decreased edema  .  All available labs, radiology testing, previous records reviewed. Agree  with documented assessment and plan.   Acute diastolic HF:  I would continue the high dose diuretic today and can probably switch to PO in the AM.  Now on Farxiga.    Atrial fib: Rate controlled.  Not on DOAC with chronic thrombocytopenia and high risk for bleed.   Jeneen Rinks Emilene Roma   10:05 AM  04/02/2021

## 2021-04-02 NOTE — TOC Benefit Eligibility Note (Signed)
Patient Teacher, English as a foreign language completed.    The patient is currently admitted and upon discharge could be taking Farxiga 10 mg.  The current 30 day co-pay is, $0.00.   The patient is insured through [place], [place] Patient [place] Team Direct Number: [phone]  Fax: [phone]

## 2021-04-02 NOTE — Progress Notes (Signed)
Heart Failure Stewardship Pharmacist Progress Note   PCP: Lujean Amel, MD PCP-Cardiologist: Skeet Latch, MD    HPI:  35 YOF with hx of cirrhosis, chronic diastolic HF, pulmonary HTN, Afib, and T2DM presenting to the ED on 03/29/21 with c/o SOB. Patient was just d/c from [facility] on 03/22/21 with dx of pneumonia and reports increasing SOB since then. Pt's husband states she had trouble sleeping even with 3 pillows for elevation. Pt reported a 4lb weight gain between admissions.  Most recent ECHO on 03/20/21 shows LVEF 60-65%.    Current HF Medications: Furosemide 80 mg IV TID Metoprolol tartrate 50 mg BID Spironolactone 25 mg daily  Farxiga 10 mg daily  Prior to admission HF Medications: Spironolactone 25 mg daily  Pertinent Lab Values: Serum creatinine 1.02, BUN 16, Potassium 3.4, Sodium 137, BNP 326.1  Vital Signs: Weight: 341 lbs (admission weight: 364 lbs) Blood pressure: 120/70s Heart rate: 70s   Medication Assistance / Insurance Benefits Check: Does the patient have prescription insurance?  Yes Type of insurance plan: state health plan (transitioning to Medicare shortly)  Outpatient Pharmacy:  Prior to admission outpatient pharmacy: [facility] [place] Is the patient willing to use [place] at discharge? Yes  Assessment: 1. Acute on chronic diastolic CHF (EF 23-76%), due to presumed nonischemic cardiomyopathy. NYHA class III symptoms. - Continue furosemide 80 mg IV TID - Continue metoprolol 50 mg BID - Continue spironolactone 25 mg daily - Continue Farxiga 10 mg daily - Can consider adding Entresto in the future if BP allows for additional HFpEF optimization.   Plan: 1) Medication changes recommended at this time: - Continue current regimen  2) Patient assistance: - follow up scheduled for 7/19 with [facility], will hold off on making additional TOC appointment unless needed.  3)  Education  - Patient has been educated on current HF medications and  potential additions to HF medication regimen - Patient verbalizes understanding that over the next few months, these medication doses may change and more medications may be added to optimize HF regimen - Patient has been educated on basic disease state pathophysiology and goals of therapy - Time spent 30 mins   Kerby Nora, PharmD, BCPS Heart Failure Stewardship Pharmacist Phone [phone]  Please check [email or web address] for unit-specific pharmacist phone numbers

## 2021-04-03 DIAGNOSIS — I5033 Acute on chronic diastolic (congestive) heart failure: Secondary | ICD-10-CM | POA: Diagnosis not present

## 2021-04-03 LAB — BASIC METABOLIC PANEL
Anion gap: 5 (ref 5–15)
BUN: 14 mg/dL (ref 8–23)
CO2: 35 mmol/L — ABNORMAL HIGH (ref 22–32)
Calcium: 8.9 mg/dL (ref 8.9–10.3)
Chloride: 100 mmol/L (ref 98–111)
Creatinine, Ser: 1.12 mg/dL — ABNORMAL HIGH (ref 0.44–1.00)
GFR, Estimated: 55 mL/min — ABNORMAL LOW (ref >60)
Glucose, Bld: 139 mg/dL — ABNORMAL HIGH (ref 70–99)
Potassium: 3.4 mmol/L — ABNORMAL LOW (ref 3.5–5.1)
Sodium: 140 mmol/L (ref 135–145)

## 2021-04-03 MED ORDER — LACTULOSE 10 GM/15ML PO SOLN
30.0000 g | Freq: Once | ORAL | Status: AC
  Administered 2021-04-03: 30 g via ORAL

## 2021-04-03 NOTE — Progress Notes (Signed)
Negative PROGRESS NOTE    Hannah Khan  NUU:[id number] DOB: 1955/10/06 DOA: 03/29/2021 PCP: Lujean Amel, MD   Brief Narrative: 65 year old-year-old female with history of chronic diastolic heart failure, morbid obesity, BMI of 60, OSA, NASH/cirrhosis, chronic thrombocytopenia, diabetes type 2 presents to the ED complaining of worsening shortness of breath.  -Recently hospitalized from 6/29 until 7/4 for community-acquired pneumonia, treated with IV antibiotics, was also briefly diuresed during that time. -She reported ongoing dyspnea on exertion for weeks however worse in the last 3 days, oxygen dropped to 70s and in the mid 80s prior to admission.  X-ray consistent with CHF.    Assessment & Plan:   Principal Problem:   Acute on chronic diastolic CHF (congestive heart failure) (HCC) Active Problems:   Obstructive sleep apnea   Essential hypertension   Persistent atrial fibrillation (HCC)   Morbid obesity-   Type 2 diabetes mellitus with other specified complication (HCC)   Liver cirrhosis secondary to NASH (nonalcoholic steatohepatitis) (HCC)   Hyperlipidemia  1-Acute on chronic diastolic heart failure exacerbation -Suspect related to diet indiscretion -Continue with IV Lasix  and spironolactone -Diet education -Weight : 364----349--- 348--345----341---336 -Cardiology consulted. Started on Farxiga.  -Plan to continue IV lasix for one more day., home tomorrow.   OSA; On CPAP/  She has been using oxygen at night with CPAP in the hospital.  There was confusion last night about continue pulse oxygen and how she should use CPAP.  -I would like to see how she does with her Home CPAP, I want to monitor her oxygen level overnight to see if she will required oxygen at night.  _ I will place order for RT; Nocturnal Pulse oxymetry.   2-Vaso-vagal Presyncope Develops Facial flush, slurred speech, difficulty formulating sentences while getting IV magnesium.  ABG no hypercapnia, CT  head negative for intracranial bleed.  IV magnesium Stopped.  Glucose was normal, Vitals stable.  EKG HR 60, A fib.  Appreciate neurology evaluation, patient episode was consistent with Vaso-vagal pre syncope and less likely TIA.   Morbid obesity, BMI 60 Failure/OHS Continue with CPAP  Hypertension: Continue with Cardizem and Lopressor  Type 2 diabetes:  A1c 5.4 Plan to discontinue Amaryl at discharge. Patient was started on Farxiga.  Continue with correctional insulin while inpatient  Chronic A. fib: Continue with Lopressor and Cardizem. Not on  anticoagulation due to thrombocytopenia.  NASH/cirrhosis Chronic thrombocytopenia Continue with rifaximin, diuretics lactulose Ammonia level double since admission. Lactulose dose increase, she has missed couple of lactulose dose in the hospital. I will give extra dose today.   Chronic Thrombocytopenia; stable.  Hypokalemia; Continue with oral potasium supplement.   Estimated body mass index is 57.75 kg/m as calculated from the following:   Height as of this encounter: 5' 4"  (1.626 m).   Weight as of this encounter: 152.6 kg.   DVT prophylaxis: SCD, not anticoagulation due to thrombocytopenia.  Code Status: Full Code Family Communication: Care discussed with patient. Disposition Plan:  Status is: Inpatient  Remains inpatient appropriate because: IV medications needed.   Dispo: The patient is from: Home              Anticipated d/c is to: Home              Patient currently is not medically stable to d/c.plan for discharge home tomorrow.    Difficult to place patient No        Consultants:  None  Procedures:  None  Antimicrobials:  Subjective: She was not able to sleep well last night. There was confusion about CPAP and continue pulse oxygen order last night/  She is very concern with the care she is receiving in the hospital. She has not received her medication on time and she didn't received all her  lactulose doses yesterday. Among others. Will have unit Directors to speak with patient.  She appreciate the care she has received from her Doctors.  She had two BM yesterday, she usually have 3-4 BM at home.    Objective: Vitals:   04/03/21 0500 04/03/21 1050 04/03/21 1139 04/03/21 1218  BP: (!) 106/59   (!) 117/54  Pulse: 78 80  61  Resp: 18 18  18   Temp: 98.8 F (37.1 C)   99 F (37.2 C)  TempSrc: Oral   Oral  SpO2: 95% 97% 98% 97%  Weight: (!) 152.6 kg     Height:        Intake/Output Summary (Last 24 hours) at 04/03/2021 1252 Last data filed at 04/03/2021 0710 Gross per 24 hour  Intake 360 ml  Output 2050 ml  Net -1690 ml    Filed Weights   04/01/21 0610 04/02/21 0536 04/03/21 0500  Weight: (!) 156.6 kg (!) 154.8 kg (!) 152.6 kg    Examination:  General exam: Alert, follows command Respiratory system: CTA Cardiovascular system: S 1, S 2 RRR Gastrointestinal system: BS present, soft, nt, nd Central nervous system: alert, conversant back to baseline  Extremities: Trace edema.   Data Reviewed: I have personally reviewed following labs and imaging studies  CBC: Recent Labs  Lab 03/29/21 1317 03/30/21 0409 03/31/21 0400 04/02/21 0345  WBC 5.8 4.5 4.2 4.4  HGB 11.7* 10.2* 10.5* 10.3*  HCT 36.0 31.6* 32.4* 31.9*  MCV 96.5 95.5 94.5 94.7  PLT DCLMP 34* 31* 34*    Basic Metabolic Panel: Recent Labs  Lab 03/31/21 0400 04/01/21 1618 04/02/21 0345 04/02/21 1447 04/03/21 1009  NA 139 137 137 137 140  K 3.3* 4.4 3.4* 3.7 3.4*  CL 100 100 98 99 100  CO2 31 28 29 29  35*  GLUCOSE 121* 112* 136* 143* 139*  BUN 14 15 16 15 14   CREATININE 1.07* 1.00 1.02* 1.20* 1.12*  CALCIUM 9.1 9.1 8.9 8.9 8.9  MG  --   --  1.8  --   --   PHOS  --   --  3.3  --   --     GFR: Estimated Creatinine Clearance: 74.2 mL/min (A) (by C-G formula based on SCr of 1.12 mg/dL (H)). Liver Function Tests: Recent Labs  Lab 03/29/21 1317  AST 26  ALT 17  ALKPHOS 78  BILITOT  4.4*  PROT 7.4  ALBUMIN 3.2*    No results for input(s): LIPASE, AMYLASE in the last 168 hours. Recent Labs  Lab 03/29/21 1317 04/02/21 1447  AMMONIA 49* 96*    Coagulation Profile: Recent Labs  Lab 03/29/21 1317  INR 1.2    Cardiac Enzymes: No results for input(s): CKTOTAL, CKMB, CKMBINDEX, TROPONINI in the last 168 hours. BNP (last 3 results) No results for input(s): PROBNP in the last 8760 hours. HbA1C: No results for input(s): HGBA1C in the last 72 hours. CBG: Recent Labs  Lab 04/02/21 0727 04/02/21 1419  GLUCAP 181* 140*    Lipid Profile: No results for input(s): CHOL, HDL, LDLCALC, TRIG, CHOLHDL, LDLDIRECT in the last 72 hours. Thyroid Function Tests: No results for input(s): TSH, T4TOTAL, FREET4, T3FREE, THYROIDAB in the last 72  hours. Anemia Panel: No results for input(s): VITAMINB12, FOLATE, FERRITIN, TIBC, IRON, RETICCTPCT in the last 72 hours. Sepsis Labs: No results for input(s): PROCALCITON, LATICACIDVEN in the last 168 hours.  Recent Results (from the past 240 hour(s))  SARS CORONAVIRUS 2 (TAT 6-24 HRS) Nasopharyngeal Nasopharyngeal Swab     Status: None   Collection Time: 03/29/21  1:20 PM   Specimen: Nasopharyngeal Swab  Result Value Ref Range Status   SARS Coronavirus 2 NEGATIVE NEGATIVE Final    Comment: (NOTE) SARS-CoV-2 target nucleic acids are NOT DETECTED.  The SARS-CoV-2 RNA is generally detectable in upper and lower respiratory specimens during the acute phase of infection. Negative results do not preclude SARS-CoV-2 infection, do not rule out co-infections with other pathogens, and should not be used as the sole basis for treatment or other patient management decisions. Negative results must be combined with clinical observations, patient history, and epidemiological information. The expected result is Negative.  Fact Sheet for Patients: SugarRoll.be  Fact Sheet for Healthcare  Providers: https://www.woods-mathews.com/  This test is not yet approved or cleared by the [place] FDA and  has been authorized for detection and/or diagnosis of SARS-CoV-2 by FDA under an Emergency Use Authorization (EUA). This EUA will remain  in effect (meaning this test can be used) for the duration of the COVID-19 declaration under Se ction 564(b)(1) of the Act, 21 U.S.C. section 360bbb-3(b)(1), unless the authorization is terminated or revoked sooner.  Performed at [facility], [place] [address] [place], [place] [address]           Radiology Studies: CT HEAD WO CONTRAST  Result Date: 04/02/2021 CLINICAL DATA:  Provided history: Mental status change, unknown cause. Episode of dizziness and headache today. EXAM: CT HEAD WITHOUT CONTRAST TECHNIQUE: Contiguous axial images were obtained from the base of the skull through the vertex without intravenous contrast. COMPARISON:  Prior head CT examinations 01/21/2021 and earlier. FINDINGS: Brain: Cerebral volume is normal. There is no acute intracranial hemorrhage. No demarcated cortical infarct. No extra-axial fluid collection. No evidence of an intracranial mass. No midline shift. Vascular: No hyperdense vessel.  Atherosclerotic calcifications. Skull: Normal. Negative for fracture or focal lesion. Sinuses/Orbits: Visualized orbits show no acute finding. Small volume frothy secretions within the left sphenoid sinus. IMPRESSION: No evidence of acute intracranial abnormality. Left sphenoid sinusitis. Electronically Signed   By: Kellie Simmering DO   On: 04/02/2021 16:29        Scheduled Meds:  dapagliflozin propanediol  10 mg Oral Daily   diltiazem  360 mg Oral Daily   docusate sodium  100 mg Oral BID   furosemide  80 mg Intravenous TID   lactulose  30 g Oral TID   metoprolol tartrate  50 mg Oral BID   potassium chloride  40 mEq Oral BID   rifaximin  550 mg Oral BID   sodium chloride flush  3 mL Intravenous Q12H    spironolactone  25 mg Oral Daily   Continuous Infusions:   LOS: 4 days    Time spent: 35 minutes.     Elmarie Shiley, MD [facility]   If 7PM-7AM, please contact night-coverage [email or web address]  04/03/2021, 12:52 PM

## 2021-04-03 NOTE — Progress Notes (Signed)
Occupational Therapy Treatment Patient Details Name: Hannah Khan MRN: [id number] DOB: [birth year] Today's Date: 04/03/2021    History of present illness Hannah Khan is a 65 y.o. female presenting 03/29/21 with SOB.  She was last hospitalized from 6/29-7/4 for CAP, treated for 5 days with Rocephin/Azithromycin.  She was also treated for RUE cellulitis and acute on chronic diastolic CHF during that hospitalization.  Since d/c, she feels like she never quite recovered from the PNA.  Her SOB got worse and worse.  She was unable to elevate her feet since she had orthopnea.  Pt dx this admission with acute on chronic CHF.  PMH -- with medical history significant of DM; afib not on AC due to chronic thrombocytopenia; HTN; chronic diastolic CHF; morbid obesity; OSA on CPAP; and NASH cirrhosis   OT comments  Pt presented sitting at EOB with on room air at 97%. Pt was able to compelte room level ambulation with RW and standing ADLS at the sink for about 3-5 mins with o2 remaining in 90%. Pt completed two static standing intervals with o2 remaining in 90% but started to have some back discomfort and required rest breaks . Pt was educated on how they can protect RUE with transfers. Pt currently with functional limitations due to the deficits listed below (see OT Problem List).  Pt will benefit from skilled OT to increase their safety and independence with ADL and functional mobility for ADL to facilitate discharge to venue listed below.    Follow Up Recommendations  Supervision/Assistance - 24 hour;Home health OT    Equipment Recommendations  None recommended by OT    Recommendations for Other Services      Precautions / Restrictions Precautions Precaution Comments: monitor sats       Mobility Bed Mobility Overal bed mobility:  (presented sitting EOB)                  Transfers   Equipment used: Rolling walker (2 wheeled) Transfers: Sit to/from Stand Sit to Stand: Supervision          General transfer comment: supervision for safety    Balance Overall balance assessment: No apparent balance deficits (not formally assessed)                                         ADL either performed or assessed with clinical judgement   ADL Overall ADL's : Needs assistance/impaired Eating/Feeding: Independent;Sitting   Grooming: Wash/dry hands;Oral care;Supervision/safety;Cueing for safety;Cueing for sequencing;Standing   Upper Body Bathing: Supervision/ safety;Cueing for safety;Cueing for sequencing;Sitting   Lower Body Bathing: Minimal assistance;Cueing for safety;Cueing for sequencing;Sit to/from stand   Upper Body Dressing : Supervision/safety;Cueing for safety;Cueing for sequencing;Sitting   Lower Body Dressing: Minimal assistance;Cueing for safety;Cueing for sequencing;Sit to/from stand   Toilet Transfer: Min guard;Regular Toilet;Grab bars   Toileting- Water quality scientist and Hygiene: Moderate assistance;Cueing for safety;Cueing for sequencing;Sit to/from stand         General ADL Comments: Pt tolerated increase in stadning actvites on no o2 with o2 in 90-97%     Vision       Perception     Praxis      Cognition Arousal/Alertness: Awake/alert Behavior During Therapy: WFL for tasks assessed/performed Overall Cognitive Status: Within Functional Limits for tasks assessed  Exercises     Shoulder Instructions       General Comments Pt noted to be able to stand with ADLS for about 3 mins and static standing for about 2 mins with stable o2 in 90% on room air    Pertinent Vitals/ Pain       Pain Assessment: No/denies pain  Home Living                                          Prior Functioning/Environment              Frequency  Min 2X/week        Progress Toward Goals  OT Goals(current goals can now be found in the care plan section)   Progress towards OT goals: Progressing toward goals  Acute Rehab OT Goals Patient Stated Goal: to get home OT Goal Formulation: With patient Time For Goal Achievement: 04/07/21 Potential to Achieve Goals: Good ADL Goals Pt Will Perform Lower Body Dressing: sit to/from stand;with modified independence Pt Will Transfer to Toilet: with modified independence;ambulating Pt Will Perform Tub/Shower Transfer: with supervision;ambulating;shower seat  Plan Discharge plan needs to be updated    Co-evaluation                 AM-PAC OT "6 Clicks" Daily Activity     Outcome Measure   Help from another person eating meals?: None Help from another person taking care of personal grooming?: None Help from another person toileting, which includes using toliet, bedpan, or urinal?: A Lot Help from another person bathing (including washing, rinsing, drying)?: A Little Help from another person to put on and taking off regular upper body clothing?: A Little Help from another person to put on and taking off regular lower body clothing?: A Little 6 Click Score: 19    End of Session Equipment Utilized During Treatment: Rolling walker  OT Visit Diagnosis: Unsteadiness on feet (R26.81);Repeated falls (R29.6);Muscle weakness (generalized) (M62.81)   Activity Tolerance     Patient Left in chair;with call bell/phone within reach;with nursing/sitter in room   Nurse Communication          Time: 1352-1430 OT Time Calculation (min): 38 min  Charges: OT General Charges $OT Visit: 1 Visit OT Treatments $Self Care/Home Management : 38-52 mins  Joeseph Amor OTR/L  Acute Rehab Services  [phone] office number [phone] pager number    Joeseph Amor 04/03/2021, 2:39 PM

## 2021-04-03 NOTE — Progress Notes (Signed)
Per MD, pt was placed on home CPAP unit. MD would like to see pt's SpO2 while sleeping without the oxygen. Pt tolerated well. RT will continue to monitor pt.

## 2021-04-03 NOTE — Progress Notes (Signed)
Progress Note  Patient Name: Hannah Khan Date of Encounter: 04/03/2021  [facility] Cardiologist: Skeet Latch, MD   Subjective   Less short of breath, improved, no chest pain, wearing CPAP.  I take care of her husband Coralyn Alyn Jurney who is in the room.  She did express to me how she has had trouble getting her lactulose on the timing schedule that if she is used to.  She is also concerned about the level of care that she is getting.  Told me about a few instances.  Inpatient Medications    Scheduled Meds:  dapagliflozin propanediol  10 mg Oral Daily   diltiazem  360 mg Oral Daily   docusate sodium  100 mg Oral BID   furosemide  80 mg Intravenous TID   lactulose  30 g Oral TID   metoprolol tartrate  50 mg Oral BID   potassium chloride  40 mEq Oral BID   rifaximin  550 mg Oral BID   sodium chloride flush  3 mL Intravenous Q12H   spironolactone  25 mg Oral Daily   Continuous Infusions:  PRN Meds: acetaminophen **OR** acetaminophen, albuterol, antiseptic oral rinse, bisacodyl, hydrALAZINE, Muscle Rub, ondansetron **OR** ondansetron (ZOFRAN) IV, oxyCODONE, polyethylene glycol, traZODone   Vital Signs    Vitals:   04/02/21 1836 04/02/21 2303 04/03/21 0500 04/03/21 1050  BP: (!) 116/55 (!) 108/49 (!) 106/59   Pulse: 77 74 78 80  Resp: 16 19 18 18   Temp: 98.4 F (36.9 C) 97.9 F (36.6 C) 98.8 F (37.1 C)   TempSrc: Oral Oral Oral   SpO2: 99% 98% 95% 97%  Weight:   (!) 152.6 kg   Height:        Intake/Output Summary (Last 24 hours) at 04/03/2021 1058 Last data filed at 04/03/2021 0710 Gross per 24 hour  Intake 360 ml  Output 2050 ml  Net -1690 ml   Last 3 Weights 04/03/2021 04/02/2021 04/01/2021  Weight (lbs) 336 lb 6.8 oz 341 lb 4.4 oz 345 lb 3.9 oz  Weight (kg) 152.6 kg 154.8 kg 156.6 kg      Telemetry    Atrial fibrillation heart rate 70s to 80s - Personally Reviewed  ECG    04/02/2021-atrial fibrillation heart rate 66 bpm- Personally Reviewed  Physical  Exam   GEN: No acute distress.   Neck: No JVD Cardiac: Irregularly irregular normal rate, no murmurs, rubs, or gallops.  Respiratory: Clear to auscultation bilaterally. GI: Soft, nontender, non-distended  MS: No edema; No deformity. Neuro:  Nonfocal  Psych: Normal affect   Labs    High Sensitivity Troponin:   Recent Labs  Lab 03/18/21 0413 03/18/21 0510 03/18/21 0658  TROPONINIHS 14 13 13       Chemistry Recent Labs  Lab 03/29/21 1317 03/30/21 0409 04/01/21 1618 04/02/21 0345 04/02/21 1447  NA 137   < > 137 137 137  K 3.5   < > 4.4 3.4* 3.7  CL 101   < > 100 98 99  CO2 28   < > 28 29 29   GLUCOSE 132*   < > 112* 136* 143*  BUN 14   < > 15 16 15   CREATININE 1.02*   < > 1.00 1.02* 1.20*  CALCIUM 9.1   < > 9.1 8.9 8.9  PROT 7.4  --   --   --   --   ALBUMIN 3.2*  --   --   --   --   AST 26  --   --   --   --  ALT 17  --   --   --   --   ALKPHOS 78  --   --   --   --   BILITOT 4.4*  --   --   --   --   GFRNONAA >60   < > >60 >60 50*  ANIONGAP 8   < > 9 10 9    < > = values in this interval not displayed.     Hematology Recent Labs  Lab 03/30/21 0409 03/31/21 0400 04/02/21 0345  WBC 4.5 4.2 4.4  RBC 3.31* 3.43* 3.37*  HGB 10.2* 10.5* 10.3*  HCT 31.6* 32.4* 31.9*  MCV 95.5 94.5 94.7  MCH 30.8 30.6 30.6  MCHC 32.3 32.4 32.3  RDW 15.9* 15.9* 15.8*  PLT 34* 31* 34*    BNP Recent Labs  Lab 03/29/21 1317  BNP 326.1*     DDimer No results for input(s): DDIMER in the last 168 hours.   Radiology    CT HEAD WO CONTRAST  Result Date: 04/02/2021 CLINICAL DATA:  Provided history: Mental status change, unknown cause. Episode of dizziness and headache today. EXAM: CT HEAD WITHOUT CONTRAST TECHNIQUE: Contiguous axial images were obtained from the base of the skull through the vertex without intravenous contrast. COMPARISON:  Prior head CT examinations 01/21/2021 and earlier. FINDINGS: Brain: Cerebral volume is normal. There is no acute intracranial hemorrhage.  No demarcated cortical infarct. No extra-axial fluid collection. No evidence of an intracranial mass. No midline shift. Vascular: No hyperdense vessel.  Atherosclerotic calcifications. Skull: Normal. Negative for fracture or focal lesion. Sinuses/Orbits: Visualized orbits show no acute finding. Small volume frothy secretions within the left sphenoid sinus. IMPRESSION: No evidence of acute intracranial abnormality. Left sphenoid sinusitis. Electronically Signed   By: Kellie Simmering DO   On: 04/02/2021 16:29    Cardiac Studies   Echocardiogram 03/20/2021:  1. Technically difficult; normal LV function; enlarged right heart.   2. Left ventricular ejection fraction, by estimation, is 60 to 65%. The  left ventricle has normal function. The left ventricle has no regional  wall motion abnormalities. There is mild left ventricular hypertrophy.  Left ventricular diastolic parameters  are indeterminate.   3. Right ventricular systolic function is normal. The right ventricular  size is moderately enlarged. There is normal pulmonary artery systolic  pressure.   4. Left atrial size was mildly dilated.   5. Right atrial size was severely dilated.   6. Large pleural effusion.   7. The mitral valve is normal in structure. Trivial mitral valve  regurgitation. No evidence of mitral stenosis.   8. The aortic valve is calcified. Aortic valve regurgitation is not  visualized. No aortic stenosis is present.   9. Aortic dilatation noted. There is borderline dilatation of the  ascending aorta, measuring 38 mm.  10. The inferior vena cava is dilated in size with >50% respiratory  variability, suggesting right atrial pressure of 8 mmHg.   Patient Profile     65 y.o. female with acute on chronic diastolic heart failure, chronic/permanent atrial fibrillation, morbid obesity with BMI of 60 and hypertension obstructive sleep apnea on CPAP with type 2 diabetes nonalcoholic steatohepatitis   Assessment & Plan    Acute on  chronic diastolic heart failure - Echocardiogram demonstrates EF 65% with mild LVH. - Recent escalation in diuretic therapy with IV Lasix 80 mg 3 times daily. - When ready, we will switch back to Bumex for better gut absorption - Continue with IV Lasix 80 -3 times  a day, potassium 40 mill equivalents twice a day, Farxiga 10 mg a day, metoprolol 50 mg twice a day, spironolactone 25 mg a day -Creatinine has slightly increased to 1.2 today.  Continue to monitor.  May need to switch to p.o. Bumex tomorrow. -Originally, she had stated that she had wished to go home however I would really like for her to stay 1 more day for aggressive IV diuresis.  Tomorrow will likely be the day where we can transition her back to Bumex.  She is quite savvy at home taking the Bumex and the metolazone as needed as prescribed by Dr. Skeet Latch.  She seems to be amenable to staying 1 more day.  Dr. Tyrell Antonio is looking into potential for transfer for her.  Persistent atrial fibrillation - Currently stable with heart rates in the 60s to 80s not on anticoagulation due to chronic thrombocytopenia - Continuing with diltiazem 3 and 60 mg a day as well as metoprolol 50 mg twice a day for rate control.  Excellent.  Diabetes with hypertension - Blood pressure stable 100s to 120s.  Continue with diltiazem and metoprolol.  Cirrhosis/NASH/morbid obesity - CPAP, per primary team.  Chronic thrombocytopenia - Platelet count ranging in 34,000 range previously.  Not able to be on anticoagulation.  35 minutes spent with patient, review of data  For questions or updates, please contact [place] Please consult [email or web address] for contact info under        Signed, Candee Furbish, MD  04/03/2021, 10:58 AM

## 2021-04-03 NOTE — Consult Note (Signed)
Neurology Consultation Reason for Consult: slow speech and fatigue Requesting Physician: Jerald Kief  CC: slow speech and fatigue  History is obtained from: Patient and chart review  HPI: Hannah Khan is a 65 y.o. woman with a past medical history significant for atrial fibrillation, obstructive sleep apnea, diabetes, hypertension, hyperlipidemia, obesity, cirrhosis w/ chronic thrombocytopenia admitted for CHF exacerbation.  She was receiving IV magnesium and she can have burning pain.  The rate was slowed with improvement in pain but then the pain recurred.  She began to feel lightheaded and felt like she was going to lose consciousness.  Subsequently she was very tired and her speech was slowed for which neurology was consulted.  She felt that the episode lasted for about an hour and felt somewhat similar to recent hospitalization for hepatic encephalopathy, but not as severe  ROS: All other review of systems was negative except as noted in the HPI.   Past Medical History:  Diagnosis Date   Anemia    hx of   Arthritis    knees   Diabetes mellitus without complication (HCC)    type 2   Fibromyalgia    HTN (hypertension)    Hx of colonic polyps    s/p partial colectomy   Hyperlipidemia    Joint pain    in knees and back spasms   Left leg swelling    wear compression hose   Liver cirrhosis secondary to NASH (nonalcoholic steatohepatitis) ([place]) dx nov 2014   had enlarged spleen also    Low blood pressure    bp varies   Morbid obesity (HCC)    Persistent atrial fibrillation ([place])    Pneumonia 10/2016   PONV (postoperative nausea and vomiting)    in past none recent   Portal hypertension ([place])    Sleep apnea    uses cpap setting of 14   Spleen enlarged    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)     Current Facility-Administered Medications:    acetaminophen (TYLENOL) tablet 650 mg, 650 mg, Oral, Q6H PRN **OR** acetaminophen (TYLENOL) suppository 650 mg, 650 mg, Rectal,  Q6H PRN, Karmen Bongo, MD   albuterol (VENTOLIN HFA) 108 (90 Base) MCG/ACT inhaler 2 puff, 2 puff, Inhalation, Q2H PRN, Karmen Bongo, MD   antiseptic oral rinse (BIOTENE) solution 15 mL, 15 mL, Mouth Rinse, PRN, Regalado, Belkys A, MD   bisacodyl (DULCOLAX) EC tablet 5 mg, 5 mg, Oral, Daily PRN, Karmen Bongo, MD   dapagliflozin propanediol (FARXIGA) tablet 10 mg, 10 mg, Oral, Daily, Coy Saunas, Prosper M, MD, 10 mg at 04/02/21 1037   diltiazem (CARDIZEM CD) 24 hr capsule 360 mg, 360 mg, Oral, Daily, Karmen Bongo, MD, 360 mg at 04/02/21 1028   docusate sodium (COLACE) capsule 100 mg, 100 mg, Oral, BID, Karmen Bongo, MD   furosemide (LASIX) injection 80 mg, 80 mg, Intravenous, TID, Lacinda Axon, MD, 80 mg at 04/03/21 0505   hydrALAZINE (APRESOLINE) injection 5 mg, 5 mg, Intravenous, Q4H PRN, Karmen Bongo, MD   lactulose (CHRONULAC) 10 GM/15ML solution 30 g, 30 g, Oral, TID, Regalado, Belkys A, MD, 30 g at 04/03/21 0504   metoprolol tartrate (LOPRESSOR) tablet 50 mg, 50 mg, Oral, BID, Karmen Bongo, MD, 50 mg at 04/02/21 2325   Muscle Rub CREA, , Topical, PRN, Regalado, Belkys A, MD, Given at 04/02/21 0623   ondansetron (ZOFRAN) tablet 4 mg, 4 mg, Oral, Q6H PRN **OR** ondansetron (ZOFRAN) injection 4 mg, 4 mg, Intravenous, Q6H PRN, Karmen Bongo, MD  oxyCODONE (Oxy IR/ROXICODONE) immediate release tablet 5 mg, 5 mg, Oral, Q4H PRN, Karmen Bongo, MD   polyethylene glycol (MIRALAX / GLYCOLAX) packet 17 g, 17 g, Oral, Daily PRN, Karmen Bongo, MD   potassium chloride SA (KLOR-CON) CR tablet 40 mEq, 40 mEq, Oral, BID, Lacinda Axon, MD, 40 mEq at 04/02/21 2325   rifaximin (XIFAXAN) tablet 550 mg, 550 mg, Oral, BID, Karmen Bongo, MD, 550 mg at 04/02/21 2325   sodium chloride flush (NS) 0.9 % injection 3 mL, 3 mL, Intravenous, Q12H, Karmen Bongo, MD, 3 mL at 04/02/21 2200   spironolactone (ALDACTONE) tablet 25 mg, 25 mg, Oral, Daily, Karmen Bongo, MD, 25 mg at  04/02/21 1029   traZODone (DESYREL) tablet 25 mg, 25 mg, Oral, QHS PRN, Karmen Bongo, MD   Family History  Problem Relation Age of Onset   Heart failure Mother    Cancer Mother    Hyperlipidemia Mother    Congestive Heart Failure Mother    Stroke Mother    Lung cancer Father    Cancer Father    Cancer Brother    Hyperlipidemia Brother    Stroke Other     Social History:  reports that she quit smoking about 46 years ago. Her smoking use included cigarettes. She has a 3.00 pack-year smoking history. She has never used smokeless tobacco. She reports current alcohol use. She reports that she does not use drugs.   Exam: Current vital signs: BP (!) 106/59 (BP Location: Left Arm)   Pulse 78   Temp 98.8 F (37.1 C) (Oral)   Resp 18   Ht 5' 4"  (1.626 m)   Wt (!) 152.6 kg   SpO2 95%   BMI 57.75 kg/m  Vital signs in last 24 hours: Temp:  [97.8 F (36.6 C)-98.8 F (37.1 C)] 98.8 F (37.1 C) (07/16 0500) Pulse Rate:  [55-78] 78 (07/16 0500) Resp:  [16-19] 18 (07/16 0500) BP: (106-123)/(49-62) 106/59 (07/16 0500) SpO2:  [95 %-99 %] 95 % (07/16 0500) Weight:  [152.6 kg] 152.6 kg (07/16 0500)   Physical Exam  Constitutional: Appears well-developed and well-nourished.  Psych: Affect appropriate to situation, calm and cooperative Eyes: No scleral injection HENT: No oropharyngeal obstruction.  MSK: no joint deformities.  Cardiovascular: Perfusing extremities well Respiratory: Short of breath with minor exertion GI: Soft.  No distension. There is no tenderness.  Skin: Warm dry and intact visible skin though she does have easy bruising including to her right cheek  Neuro: Mental Status: Patient is awake, alert, oriented to person, place, month, year, and situation. Patient is able to give a clear and coherent history. No signs of aphasia or neglect  Cranial Nerves: II: Visual Fields are full. Pupils are equal, round, and reactive to light.   III,IV, VI: EOMI without ptosis  or diploplia.  V: Facial sensation is symmetric to temperature VII: Facial movement is symmetric.  VIII: hearing is intact to voice X: Uvula elevates symmetrically XI: Shoulder shrug is symmetric. XII: tongue is midline without atrophy or fasciculations.  Motor: Tone is normal. Bulk is normal.  Mild bilateral upper extremity pronation w/o drift and some giveaway weakness in the upper extremities /limited secondary to her respiratory status and some giveaway weakness throughout the lower extremities Sensory: Sensation is symmetric to light touch and temperature in the arms and legs. Reflexes: Toes are downgoing bilaterally.  There is no clonus at the bilateral ankles Cerebellar: FNF and HKS are intact bilaterally  NIHSS total 0  I have reviewed labs in epic and the results pertinent to this consultation are:  Basic Metabolic Panel: Recent Labs  Lab 03/30/21 0409 03/31/21 0400 04/01/21 1618 04/02/21 0345 04/02/21 1447  NA 139 139 137 137 137  K 3.3* 3.3* 4.4 3.4* 3.7  CL 101 100 100 98 99  CO2 30 31 28 29 29   GLUCOSE 138* 121* 112* 136* 143*  BUN 16 14 15 16 15   CREATININE 1.01* 1.07* 1.00 1.02* 1.20*  CALCIUM 8.9 9.1 9.1 8.9 8.9  MG  --   --   --  1.8  --   PHOS  --   --   --  3.3  --     CBC: Recent Labs  Lab 03/29/21 1317 03/30/21 0409 03/31/21 0400 04/02/21 0345  WBC 5.8 4.5 4.2 4.4  HGB 11.7* 10.2* 10.5* 10.3*  HCT 36.0 31.6* 32.4* 31.9*  MCV 96.5 95.5 94.5 94.7  PLT DCLMP 34* 31* 34*   Lab Results  Component Value Date   CHOL 175 07/02/2014   HDL 59 07/02/2014   LDLCALC 97 07/02/2014   TRIG 93 07/02/2014   CHOLHDL 3.0 07/02/2014    Lab Results  Component Value Date   HGBA1C 5.4 03/18/2021     I have reviewed the images obtained: Head CT personally reviewed no acute intracranial process  ECHO 03/20/21: 1. Technically difficult; normal LV function; enlarged right heart.   2. Left ventricular ejection fraction, by estimation, is 60 to 65%.  The  left ventricle has normal function. The left ventricle has no regional  wall motion abnormalities. There is mild left ventricular hypertrophy.  Left ventricular diastolic parameters  are indeterminate.   3. Right ventricular systolic function is normal. The right ventricular  size is moderately enlarged. There is normal pulmonary artery systolic  pressure.   4. Left atrial size was mildly dilated.   5. Right atrial size was severely dilated.   6. Large pleural effusion.   7. The mitral valve is normal in structure. Trivial mitral valve  regurgitation. No evidence of mitral stenosis.   8. The aortic valve is calcified. Aortic valve regurgitation is not  visualized. No aortic stenosis is present.   9. Aortic dilatation noted. There is borderline dilatation of the  ascending aorta, measuring 38 mm.  10. The inferior vena cava is dilated in size with >50% respiratory  variability, suggesting right atrial pressure of 8 mmHg.   Impression: Vasovagal presyncope in the setting of burning pain secondary to magnesium infusion.  TIA is on the differential, however given her comorbidities, initiation of anticoagulation or even antiplatelet therapy would be high risk.  Therefore further work-up is unlikely to change management at this time in addition to her examination being very reassuring  Recommendations: - Stat head CT recommended to rule out hemorrhage given low platelet count - Continue heart failure treatment per primary team - Consider oral repletion of magnesium when possible to avoid repeat vasovagal response - Neurology will sign off at this time.  Please consult if new questions arise   Lesleigh Noe MD-PhD [facility] [phone] Available 7 AM to 7 PM, outside these hours please contact Neurologist on call listed on AMION

## 2021-04-03 NOTE — Progress Notes (Signed)
RT NOTE:  Overnight pulse ox setup and placed on patient. Pt will put CPAP on when she is ready for bed. No oxygen bled in at this time. RT will result in AM.

## 2021-04-04 DIAGNOSIS — I5033 Acute on chronic diastolic (congestive) heart failure: Secondary | ICD-10-CM | POA: Diagnosis not present

## 2021-04-04 MED ORDER — DAPAGLIFLOZIN PROPANEDIOL 10 MG PO TABS: 10.0000 mg | ORAL_TABLET | Freq: Every day | ORAL | 3 refills | Status: DC

## 2021-04-04 MED ORDER — METOLAZONE 5 MG PO TABS
ORAL_TABLET | ORAL | 0 refills | Status: DC
  Filled 2021-04-04: qty 30, 30d supply, fill #0

## 2021-04-04 MED ORDER — METOLAZONE 5 MG PO TABS: ORAL_TABLET | ORAL | 0 refills | Status: DC

## 2021-04-04 MED ORDER — BUMETANIDE 2 MG PO TABS: 2.0000 mg | ORAL_TABLET | Freq: Every day | ORAL | Status: DC

## 2021-04-04 MED ORDER — DAPAGLIFLOZIN PROPANEDIOL 10 MG PO TABS
10.0000 mg | ORAL_TABLET | Freq: Every day | ORAL | 3 refills | Status: DC
  Filled 2021-04-04: qty 30, 30d supply, fill #0

## 2021-04-04 NOTE — Plan of Care (Signed)

## 2021-04-04 NOTE — Plan of Care (Signed)
  Problem: Education: Goal: Knowledge of General Education information will improve Description: Including pain rating scale, medication(s)/side effects and non-pharmacologic comfort measures 04/04/2021 1649 by Sanjuana Mae, RN Outcome: Adequate for Discharge 04/04/2021 1119 by Sanjuana Mae, RN Outcome: Progressing   Problem: Clinical Measurements: Goal: Ability to maintain clinical measurements within normal limits will improve 04/04/2021 1649 by Sanjuana Mae, RN Outcome: Adequate for Discharge 04/04/2021 1119 by Sanjuana Mae, RN Outcome: Progressing Goal: Will remain free from infection 04/04/2021 1649 by Sanjuana Mae, RN Outcome: Adequate for Discharge 04/04/2021 1119 by Sanjuana Mae, RN Outcome: Progressing Goal: Diagnostic test results will improve 04/04/2021 1649 by Sanjuana Mae, RN Outcome: Adequate for Discharge 04/04/2021 1119 by Sanjuana Mae, RN Outcome: Progressing Goal: Respiratory complications will improve 04/04/2021 1649 by Sanjuana Mae, RN Outcome: Adequate for Discharge 04/04/2021 1119 by Sanjuana Mae, RN Outcome: Progressing Goal: Cardiovascular complication will be avoided 04/04/2021 1649 by Sanjuana Mae, RN Outcome: Adequate for Discharge 04/04/2021 1119 by Sanjuana Mae, RN Outcome: Progressing

## 2021-04-04 NOTE — TOC Transition Note (Signed)
Transition of Care [facility]) - CM/SW Discharge Note   Patient Details  Name: KANANI MOWBRAY MRN: [id number] Date of Birth: 1956/07/04  [facility] CM/SW Contact:  Bartholomew Crews, RN Phone Number: [phone] 04/04/2021, 3:46 PM   Clinical Narrative:     Spoke with patient and son at the bedside to discuss transition plans. Patient had oximetry overnight to assess for home oxygen at night. Referral to [employer] - notified that patient's meets criteria for oxygen. [employer] to deliver home oxygen to the home. Son to provide transportation home.   Final next level of care: [place] Barriers to Discharge: No Barriers Identified   Patient Goals and CMS Choice Patient states their goals for this hospitalization and ongoing recovery are:: return home CMS Medicare.gov Compare Post Acute Care list provided to:: Patient Choice offered to / list presented to : Patient  Discharge Placement                       Discharge Plan and Services   Discharge Planning Services: CM Consult Post Acute Care Choice: Home Health          DME Arranged: N/A DME Agency: NA       HH Arranged: PT HH Agency: [facility] Date HH Agency Contacted: 04/04/21 Time [place]: 1478 Representative spoke with at [place]: noted previous CM spoke with Danae Chen who accepted referral  Social Determinants of Health (SDOH) Interventions     Readmission Risk Interventions No flowsheet data found.

## 2021-04-04 NOTE — Progress Notes (Signed)
Progress Note  Patient Name: Hannah Khan Date of Encounter: 04/04/2021  [facility] Cardiologist: Skeet Latch, MD   Subjective   Sitting up in chair. Awaiting discharge. Less SOB, no CP  Inpatient Medications    Scheduled Meds:  [START ON 04/05/2021] bumetanide  2 mg Oral Daily   dapagliflozin propanediol  10 mg Oral Daily   diltiazem  360 mg Oral Daily   docusate sodium  100 mg Oral BID   lactulose  30 g Oral TID   metoprolol tartrate  50 mg Oral BID   potassium chloride  40 mEq Oral BID   rifaximin  550 mg Oral BID   sodium chloride flush  3 mL Intravenous Q12H   spironolactone  25 mg Oral Daily   Continuous Infusions:  PRN Meds: acetaminophen **OR** acetaminophen, albuterol, antiseptic oral rinse, bisacodyl, hydrALAZINE, Muscle Rub, ondansetron **OR** ondansetron (ZOFRAN) IV, oxyCODONE, polyethylene glycol, traZODone   Vital Signs    Vitals:   04/03/21 1653 04/04/21 0124 04/04/21 0533 04/04/21 1219  BP: (!) 116/53 (!) 98/59 (!) 112/51 (!) 116/50  Pulse: 61 72 78 76  Resp: 18 18 18 16   Temp: 97.7 F (36.5 C) 98.1 F (36.7 C) 97.7 F (36.5 C) 98.4 F (36.9 C)  TempSrc: Oral Oral Oral Oral  SpO2: 95% 95% 93% 95%  Weight:   (!) 149.7 kg   Height:        Intake/Output Summary (Last 24 hours) at 04/04/2021 1412 Last data filed at 04/04/2021 0958 Gross per 24 hour  Intake 480 ml  Output 4400 ml  Net -3920 ml   Last 3 Weights 04/04/2021 04/03/2021 04/02/2021  Weight (lbs) 330 lb 0.5 oz 336 lb 6.8 oz 341 lb 4.4 oz  Weight (kg) 149.7 kg 152.6 kg 154.8 kg      Telemetry    Atrial fibrillation heart rate 70's- Personally Reviewed  ECG    04/02/2021-atrial fibrillation heart rate 66 bpm- Personally Reviewed  Physical Exam   GEN: Well nourished, well developed, in no acute distress HEENT: normal Neck: no JVD, carotid bruits, or masses Cardiac: RRR; no murmurs, rubs, or gallops,no edema  Respiratory:  clear to auscultation bilaterally, normal work  of breathing GI: soft, nontender, nondistended, + BS MS: no deformity or atrophy Skin: warm and dry, no rash Neuro:  Alert and Oriented x 3, Strength and sensation are intact Psych: euthymic mood, full affect   Labs    High Sensitivity Troponin:   Recent Labs  Lab 03/18/21 0413 03/18/21 0510 03/18/21 0658  TROPONINIHS 14 13 13       Chemistry Recent Labs  Lab 03/29/21 1317 03/30/21 0409 04/02/21 0345 04/02/21 1447 04/03/21 1009  NA 137   < > 137 137 140  K 3.5   < > 3.4* 3.7 3.4*  CL 101   < > 98 99 100  CO2 28   < > 29 29 35*  GLUCOSE 132*   < > 136* 143* 139*  BUN 14   < > 16 15 14   CREATININE 1.02*   < > 1.02* 1.20* 1.12*  CALCIUM 9.1   < > 8.9 8.9 8.9  PROT 7.4  --   --   --   --   ALBUMIN 3.2*  --   --   --   --   AST 26  --   --   --   --   ALT 17  --   --   --   --  ALKPHOS 78  --   --   --   --   BILITOT 4.4*  --   --   --   --   GFRNONAA >60   < > >60 50* 55*  ANIONGAP 8   < > 10 9 5    < > = values in this interval not displayed.     Hematology Recent Labs  Lab 03/30/21 0409 03/31/21 0400 04/02/21 0345  WBC 4.5 4.2 4.4  RBC 3.31* 3.43* 3.37*  HGB 10.2* 10.5* 10.3*  HCT 31.6* 32.4* 31.9*  MCV 95.5 94.5 94.7  MCH 30.8 30.6 30.6  MCHC 32.3 32.4 32.3  RDW 15.9* 15.9* 15.8*  PLT 34* 31* 34*    BNP Recent Labs  Lab 03/29/21 1317  BNP 326.1*     DDimer No results for input(s): DDIMER in the last 168 hours.   Radiology    CT HEAD WO CONTRAST  Result Date: 04/02/2021 CLINICAL DATA:  Provided history: Mental status change, unknown cause. Episode of dizziness and headache today. EXAM: CT HEAD WITHOUT CONTRAST TECHNIQUE: Contiguous axial images were obtained from the base of the skull through the vertex without intravenous contrast. COMPARISON:  Prior head CT examinations 01/21/2021 and earlier. FINDINGS: Brain: Cerebral volume is normal. There is no acute intracranial hemorrhage. No demarcated cortical infarct. No extra-axial fluid collection.  No evidence of an intracranial mass. No midline shift. Vascular: No hyperdense vessel.  Atherosclerotic calcifications. Skull: Normal. Negative for fracture or focal lesion. Sinuses/Orbits: Visualized orbits show no acute finding. Small volume frothy secretions within the left sphenoid sinus. IMPRESSION: No evidence of acute intracranial abnormality. Left sphenoid sinusitis. Electronically Signed   By: Kellie Simmering DO   On: 04/02/2021 16:29    Cardiac Studies   Echocardiogram 03/20/2021:  1. Technically difficult; normal LV function; enlarged right heart.   2. Left ventricular ejection fraction, by estimation, is 60 to 65%. The  left ventricle has normal function. The left ventricle has no regional  wall motion abnormalities. There is mild left ventricular hypertrophy.  Left ventricular diastolic parameters  are indeterminate.   3. Right ventricular systolic function is normal. The right ventricular  size is moderately enlarged. There is normal pulmonary artery systolic  pressure.   4. Left atrial size was mildly dilated.   5. Right atrial size was severely dilated.   6. Large pleural effusion.   7. The mitral valve is normal in structure. Trivial mitral valve  regurgitation. No evidence of mitral stenosis.   8. The aortic valve is calcified. Aortic valve regurgitation is not  visualized. No aortic stenosis is present.   9. Aortic dilatation noted. There is borderline dilatation of the  ascending aorta, measuring 38 mm.  10. The inferior vena cava is dilated in size with >50% respiratory  variability, suggesting right atrial pressure of 8 mmHg.   Patient Profile     65 y.o. female with acute on chronic diastolic heart failure, chronic/permanent atrial fibrillation, morbid obesity with BMI of 60 and hypertension obstructive sleep apnea on CPAP with type 2 diabetes nonalcoholic steatohepatitis   Assessment & Plan    Acute on chronic diastolic heart failure - Echocardiogram demonstrates  EF 65% with mild LVH. - IV Lasix 80 mg 3 times daily. - OK to switch back to Bumex home dose with PRN metolazone as directed by Dr. Oval Linsey.  Farxiga 10 mg a day, metoprolol 50 mg twice a day, spironolactone 25 mg a day  Persistent atrial fibrillation - Currently stable with  heart rates in the 60s to 80s not on anticoagulation due to chronic thrombocytopenia - Continuing with diltiazem 3 and 60 mg a day as well as metoprolol 50 mg twice a day for rate control.    Diabetes with hypertension - Blood pressure stable 100s to 120s.  Continue with diltiazem and metoprolol. No changes  Cirrhosis/NASH/morbid obesity - CPAP, per primary team.  Chronic thrombocytopenia - Platelet count ranging in 34,000 range previously.  Not able to be on anticoagulation.  Discussed with Dr. Tyrell Antonio earlier today. OK with DC.   For questions or updates, please contact [place] Please consult [email or web address] for contact info under        Signed, Candee Furbish, MD  04/04/2021, 2:12 PM

## 2021-04-04 NOTE — Discharge Summary (Signed)
Physician Discharge Summary  HISAE DECOURSEY OEU:[id number] DOB: 02/21/1956 DOA: 03/29/2021  PCP: Lujean Amel, MD  Admit date: 03/29/2021 Discharge date: 04/04/2021  Admitted From:  Home Disposition: Home   Recommendations for Outpatient Follow-up:  Follow up with PCP in 1-2 weeks Please obtain BMP/CBC in one week Follow up with cardiology for further care of HF  Home Health: PT  Discharge Condition: Stable.  CODE STATUS:Full Code Diet recommendation: Heart Healthy  Brief/Interim Summary: 65 year old-year-old female with history of chronic diastolic heart failure, morbid obesity, BMI of 60, OSA, NASH/cirrhosis, chronic thrombocytopenia, diabetes type 2 presents to the ED complaining of worsening shortness of breath.   -Recently hospitalized from 6/29 until 7/4 for community-acquired pneumonia, treated with IV antibiotics, was also briefly diuresed during that time. -She reported ongoing dyspnea on exertion for weeks however worse in the last 3 days, oxygen dropped to 70s and in the mid 80s prior to admission.  X-ray consistent with CHF.  1-Acute on chronic diastolic heart failure exacerbation -Suspect related to diet indiscretion -Continue with IV Lasix  and spironolactone -Diet education -Weight : 364----349--- 348--345----341---336--331 -Cardiology consulted. Started on Farxiga.  -plan to discharge on Bumex 2 mg daily/ metolazone 5 mg every 3 days if weight increases more than 4 pounds. She has close follow up with cardiology    OSA; On CPAP/ She has been using oxygen at night with CPAP in the hospital.  There was confusion last night about continue pulse oxygen and how she should use CPAP. -I would like to see how she does with her Home CPAP, I want to monitor her oxygen level overnight to see if she will required oxygen at night. -Awaiting [employer]  to review nocturnal pulse oxymetry. If she qualifies they will deliver oxygen.    2-Vaso-vagal Presyncope Develops Facial flush,  slurred speech, difficulty formulating sentences while getting IV magnesium.  ABG no hypercapnia, CT head negative for intracranial bleed.  IV magnesium Stopped. Glucose was normal, Vitals stable. EKG HR 60, A fib.  Appreciate neurology evaluation, patient episode was consistent with Vaso-vagal pre syncope and less likely TIA.   Morbid obesity, BMI 60 Failure/OHS Continue with CPAP   Hypertension: Continue with Cardizem and Lopressor   Type 2 diabetes:  A1c 5.4 Plan to discontinue Amaryl at discharge. Patient was started on Farxiga. Continue with correctional insulin while inpatient   Chronic A. fib: Continue with Lopressor and Cardizem. Not on  anticoagulation due to thrombocytopenia.   NASH/cirrhosis Chronic thrombocytopenia Continue with rifaximin, diuretics lactulose Ammonia level double since admission. Lactulose dose increase, she has missed couple of lactulose dose in the hospital.  Continue with lactulose.   Chronic Thrombocytopenia; Stable.  Hypokalemia; Continue with oral potasium supplement.    Estimated body mass index is 57.75 kg/m as calculated from the following:   Height as of this encounter: 5' 4"  (1.626 m).   Weight as of this encounter: 152.6 kg.    Discharge Diagnoses:  Principal Problem:   Acute on chronic diastolic CHF (congestive heart failure) (HCC) Active Problems:   Obstructive sleep apnea   Essential hypertension   Persistent atrial fibrillation (HCC)   Morbid obesity-   Type 2 diabetes mellitus with other specified complication (HCC)   Liver cirrhosis secondary to NASH (nonalcoholic steatohepatitis) ([place])   Hyperlipidemia    Discharge Instructions  Discharge Instructions     Diet - low sodium heart healthy   Complete by: As directed    Increase activity slowly   Complete by: As directed  Allergies as of 04/04/2021       Reactions   Celecoxib Other (See Comments)   "speech slurred" with celebrex   Shellfish Allergy  Swelling, Rash   TINGLING AND SWELLING OF LIPS. LARGE WHELPS QUARTER-SIZE   Barium-containing Compounds Other (See Comments)   TACHYCARDIA   Prednisone Other (See Comments)   TACHYCARDIA   Statins Other (See Comments)   AGGRAVATED FIBROMYALGIA   Fentanyl    Hallucinations    Ibuprofen Other (See Comments)   UNSPECIFIED SPECIFIC REACTION >> "DUE TO PLATELETS"   Tramadol    Hallucination   Tylenol [acetaminophen] Other (See Comments)   UNSPECIFIED SPECIFIC REACTION >> "DUE TO PLATELETS"   Zetia [ezetimibe]    Headache        Medication List     STOP taking these medications    glimepiride 1 MG tablet Commonly known as: AMARYL       TAKE these medications    albuterol (2.5 MG/3ML) 0.083% nebulizer solution Commonly known as: PROVENTIL Take 3 mLs (2.5 mg total) by nebulization every 6 (six) hours as needed for wheezing or shortness of breath.   bumetanide 2 MG tablet Commonly known as: BUMEX Take 1 tablet (2 mg total) by mouth daily.   dapagliflozin propanediol 10 MG Tabs tablet Commonly known as: FARXIGA Take 1 tablet (10 mg total) by mouth daily. Start taking on: April 05, 2021   diltiazem 360 MG 24 hr capsule Commonly known as: CARDIZEM CD Take 1 capsule (360 mg total) by mouth daily.   lactulose 10 GM/15ML solution Commonly known as: CHRONULAC Take 30 mLs by mouth 3 (three) times daily.   lidocaine 5 % Commonly known as: LIDODERM Place 1 patch onto the skin daily. Remove & Discard patch within 12 hours or as directed by MD What changed:  when to take this reasons to take this   metolazone 5 MG tablet Commonly known as: ZAROXOLYN Take 1 table every 3 days  as needed for more than 4 pound weight gain.   metoprolol tartrate 50 MG tablet Commonly known as: LOPRESSOR Take 1 tablet (50 mg total) by mouth 2 (two) times daily.   OneTouch Delica Plus [id number] Misc Apply topically 3 (three) times daily.   OneTouch Verio test strip Generic drug:  glucose blood 1 each 3 (three) times daily.   potassium chloride 10 MEQ CR capsule Commonly known as: MICRO-K Take 2 capsules (20 mEq total) by mouth daily.   spironolactone 25 MG tablet Commonly known as: ALDACTONE Take 1 tablet (25 mg total) by mouth daily.   Xifaxan 550 MG Tabs tablet Generic drug: rifaximin Take 550 mg by mouth 2 (two) times daily.        Follow-up Information     [facility] Follow up.   Specialty: Home Health Services Contact information: [address] [place] [address] [phone]                Allergies  Allergen Reactions   Celecoxib Other (See Comments)    "speech slurred" with celebrex   Shellfish Allergy Swelling and Rash    TINGLING AND SWELLING OF LIPS. LARGE WHELPS QUARTER-SIZE   Barium-Containing Compounds Other (See Comments)    TACHYCARDIA   Prednisone Other (See Comments)    TACHYCARDIA   Statins Other (See Comments)    AGGRAVATED FIBROMYALGIA   Fentanyl     Hallucinations    Ibuprofen Other (See Comments)    UNSPECIFIED SPECIFIC REACTION >> "DUE TO PLATELETS"  Tramadol     Hallucination    Tylenol [Acetaminophen] Other (See Comments)    UNSPECIFIED SPECIFIC REACTION >> "DUE TO PLATELETS"   Zetia [Ezetimibe]     Headache    Consultations: Cardiology    Procedures/Studies: CT HEAD WO CONTRAST  Result Date: 04/02/2021 CLINICAL DATA:  Provided history: Mental status change, unknown cause. Episode of dizziness and headache today. EXAM: CT HEAD WITHOUT CONTRAST TECHNIQUE: Contiguous axial images were obtained from the base of the skull through the vertex without intravenous contrast. COMPARISON:  Prior head CT examinations 01/21/2021 and earlier. FINDINGS: Brain: Cerebral volume is normal. There is no acute intracranial hemorrhage. No demarcated cortical infarct. No extra-axial fluid collection. No evidence of an intracranial mass. No midline shift. Vascular: No hyperdense vessel.   Atherosclerotic calcifications. Skull: Normal. Negative for fracture or focal lesion. Sinuses/Orbits: Visualized orbits show no acute finding. Small volume frothy secretions within the left sphenoid sinus. IMPRESSION: No evidence of acute intracranial abnormality. Left sphenoid sinusitis. Electronically Signed   By: Kellie Simmering DO   On: 04/02/2021 16:29   DG Chest Port 1 View  Result Date: 03/29/2021 CLINICAL DATA:  Shortness of breath. EXAM: PORTABLE CHEST 1 VIEW COMPARISON:  03/17/2021 FINDINGS: 1345 hours. The cardio pericardial silhouette is enlarged. Vascular congestion. Right base better aerated but there is persistent left base collapse/consolidation with probable small left effusion. The visualized bony structures of the thorax show no acute abnormality. Telemetry leads overlie the chest. IMPRESSION: Cardiomegaly with vascular congestion. Persistent retrocardiac left base collapse/consolidation with small left effusion. Electronically Signed   By: Misty Stanley M.D.   On: 03/29/2021 14:18   DG Chest Portable 1 View  Result Date: 03/17/2021 CLINICAL DATA:  Hypoxia short of breath EXAM: PORTABLE CHEST 1 VIEW COMPARISON:  01/16/2021, CT 01/10/2021 FINDINGS: Cardiomegaly with vascular congestion and edema. Suspect moderate bilateral pleural effusions, likely contributing to asymmetric hazy opacity on the right. Airspace disease at the bases. IMPRESSION: Cardiomegaly with vascular congestion, bilateral effusions and edema. Basilar consolidations may reflect atelectasis or pneumonia Electronically Signed   By: Donavan Foil M.D.   On: 03/17/2021 21:55   ECHOCARDIOGRAM COMPLETE  Result Date: 03/20/2021    ECHOCARDIOGRAM REPORT   Patient Name:   Hannah Khan Date of Exam: 03/20/2021 Medical Rec #:  [id number]       Height:       64.0 in Accession #:    [id number]      Weight:       365.3 lb Date of Birth:  01/12/1956       BSA:          2.527 m Patient Age:    65 years        BP:           112/70 mmHg  Patient Gender: F               HR:           105 bpm. Exam Location:  Inpatient Procedure: 2D Echo, Cardiac Doppler, Color Doppler and Intracardiac            Opacification Agent Indications:    I50.40* Unspecified combined systolic (congestive) and diastolic                 (congestive) heart failure  History:        Patient has prior history of Echocardiogram examinations, most                 recent  03/17/2020. Abnormal ECG, Arrythmias:Atrial Fibrillation,                 Signs/Symptoms:Dyspnea, Shortness of Breath, Murmur and Fever;                 Risk Factors:Sleep Apnea, Hypertension and Diabetes.  Sonographer:    Roseanna Rainbow RDCS Referring Phys: [id number] [facility]  Sonographer Comments: Technically difficult study due to poor echo windows, patient is morbidly obese, suboptimal parasternal window, suboptimal apical window and suboptimal subcostal window. Image acquisition challenging due to patient body habitus. Very difficult study. Only off axis images. IMPRESSIONS  1. Technically difficult; normal LV function; enlarged right heart.  2. Left ventricular ejection fraction, by estimation, is 60 to 65%. The left ventricle has normal function. The left ventricle has no regional wall motion abnormalities. There is mild left ventricular hypertrophy. Left ventricular diastolic parameters are indeterminate.  3. Right ventricular systolic function is normal. The right ventricular size is moderately enlarged. There is normal pulmonary artery systolic pressure.  4. Left atrial size was mildly dilated.  5. Right atrial size was severely dilated.  6. Large pleural effusion.  7. The mitral valve is normal in structure. Trivial mitral valve regurgitation. No evidence of mitral stenosis.  8. The aortic valve is calcified. Aortic valve regurgitation is not visualized. No aortic stenosis is present.  9. Aortic dilatation noted. There is borderline dilatation of the ascending aorta, measuring 38 mm. 10. The inferior  vena cava is dilated in size with >50% respiratory variability, suggesting right atrial pressure of 8 mmHg. FINDINGS  Left Ventricle: Left ventricular ejection fraction, by estimation, is 60 to 65%. The left ventricle has normal function. The left ventricle has no regional wall motion abnormalities. Definity contrast agent was given IV to delineate the left ventricular  endocardial borders. The left ventricular internal cavity size was normal in size. There is mild left ventricular hypertrophy. Left ventricular diastolic parameters are indeterminate. Right Ventricle: The right ventricular size is moderately enlarged. Right ventricular systolic function is normal. There is normal pulmonary artery systolic pressure. The tricuspid regurgitant velocity is 2.08 m/s, and with an assumed right atrial pressure of 8 mmHg, the estimated right ventricular systolic pressure is 45.4 mmHg. Left Atrium: Left atrial size was mildly dilated. Right Atrium: Right atrial size was severely dilated. Pericardium: Trivial pericardial effusion is present. Mitral Valve: The mitral valve is normal in structure. Trivial mitral valve regurgitation. No evidence of mitral valve stenosis. Tricuspid Valve: The tricuspid valve is normal in structure. Tricuspid valve regurgitation is mild . No evidence of tricuspid stenosis. Aortic Valve: The aortic valve is calcified. Aortic valve regurgitation is not visualized. No aortic stenosis is present. Aortic valve mean gradient measures 8.0 mmHg. Aortic valve peak gradient measures 13.8 mmHg. Aortic valve area, by VTI measures 2.66  cm. Pulmonic Valve: The pulmonic valve was not well visualized. Pulmonic valve regurgitation is trivial. No evidence of pulmonic stenosis. Aorta: The aortic root is normal in size and structure and aortic dilatation noted. There is borderline dilatation of the ascending aorta, measuring 38 mm. Venous: The inferior vena cava is dilated in size with greater than 50% respiratory  variability, suggesting right atrial pressure of 8 mmHg.  Additional Comments: Technically difficult; normal LV function; enlarged right heart. There is a large pleural effusion.  LEFT VENTRICLE PLAX 2D LVIDd:         4.90 cm LVIDs:         3.20 cm LV PW:  1.60 cm LV IVS:        1.60 cm LVOT diam:     2.00 cm LV SV:         82 LV SV Index:   32 LVOT Area:     3.14 cm  LV Volumes (MOD) LV vol d, MOD A2C: 38.4 ml LV vol d, MOD A4C: 85.6 ml LV vol s, MOD A2C: 13.9 ml LV vol s, MOD A4C: 29.3 ml LV SV MOD A2C:     24.5 ml LV SV MOD A4C:     85.6 ml LV SV MOD BP:      44.9 ml IVC IVC diam: 4.50 cm LEFT ATRIUM           Index       RIGHT ATRIUM           Index LA diam:      4.20 cm 1.66 cm/m  RA Area:     34.50 cm LA Vol (A2C): 22.8 ml 9.02 ml/m  RA Volume:   129.00 ml 51.04 ml/m LA Vol (A4C): 80.3 ml 31.77 ml/m  AORTIC VALVE                    PULMONIC VALVE AV Area (Vmax):    2.28 cm     PR End Diast Vel: 1.77 msec AV Area (Vmean):   2.36 cm AV Area (VTI):     2.66 cm AV Vmax:           186.00 cm/s AV Vmean:          133.000 cm/s AV VTI:            0.307 m AV Peak Grad:      13.8 mmHg AV Mean Grad:      8.0 mmHg LVOT Vmax:         135.00 cm/s LVOT Vmean:        100.000 cm/s LVOT VTI:          0.260 m LVOT/AV VTI ratio: 0.85  AORTA Ao Root diam: 3.10 cm Ao Asc diam:  3.80 cm MITRAL VALVE                TRICUSPID VALVE MV Area (PHT): 5.05 cm     TR Peak grad:   17.3 mmHg MV Decel Time: 150 msec     TR Vmax:        208.00 cm/s MV E velocity: 149.33 cm/s                             SHUNTS                             Systemic VTI:  0.26 m                             Systemic Diam: 2.00 cm Kirk Ruths MD Electronically signed by Kirk Ruths MD Signature Date/Time: 03/20/2021/11:44:35 AM    Final    US Abdomen Limited RUQ (LIVER/GB)  Result Date: 03/09/2021 CLINICAL DATA:  Cirrhosis. EXAM: ULTRASOUND ABDOMEN LIMITED RIGHT UPPER QUADRANT COMPARISON:  01/10/2021 CTA chest.  Abdominal ultrasound  07/21/2020. FINDINGS: Gallbladder: Surgically absent Common bile duct: Diameter: Normal, 7 mm. Liver: Irregular hepatic capsule, consistent with cirrhosis. No focal liver lesion. Portal vein is patent on color Doppler imaging with normal direction of  blood flow towards the liver. Other: Trace perihepatic ascites. IMPRESSION: Cirrhosis, without hepatocellular carcinoma. Trace perihepatic ascites. Electronically Signed   By: Abigail Miyamoto M.D.   On: 03/09/2021 14:01     Subjective: She didn't have BM yesterday. She believes she will be able to move her Bowel when she gets home.  She is breathing better.  She only had one episode of SOB while using CPAP last night.    Discharge Exam: Vitals:   04/04/21 0533 04/04/21 1219  BP: (!) 112/51 (!) 116/50  Pulse: 78 76  Resp: 18 16  Temp: 97.7 F (36.5 C) 98.4 F (36.9 C)  SpO2: 93% 95%     General: Pt is alert, awake, not in acute distress Cardiovascular: RRR, S1/S2 +, no rubs, no gallops Respiratory: CTA bilaterally, no wheezing, no rhonchi Abdominal: Soft, NT, ND, bowel sounds + Extremities: no edema, no cyanosis    The results of significant diagnostics from this hospitalization (including imaging, microbiology, ancillary and laboratory) are listed below for reference.     Microbiology: Recent Results (from the past 240 hour(s))  SARS CORONAVIRUS 2 (TAT 6-24 HRS) Nasopharyngeal Nasopharyngeal Swab     Status: None   Collection Time: 03/29/21  1:20 PM   Specimen: Nasopharyngeal Swab  Result Value Ref Range Status   SARS Coronavirus 2 NEGATIVE NEGATIVE Final    Comment: (NOTE) SARS-CoV-2 target nucleic acids are NOT DETECTED.  The SARS-CoV-2 RNA is generally detectable in upper and lower respiratory specimens during the acute phase of infection. Negative results do not preclude SARS-CoV-2 infection, do not rule out co-infections with other pathogens, and should not be used as the sole basis for treatment or other patient  management decisions. Negative results must be combined with clinical observations, patient history, and epidemiological information. The expected result is Negative.  Fact Sheet for Patients: SugarRoll.be  Fact Sheet for Healthcare Providers: https://www.woods-mathews.com/  This test is not yet approved or cleared by the [place] FDA and  has been authorized for detection and/or diagnosis of SARS-CoV-2 by FDA under an Emergency Use Authorization (EUA). This EUA will remain  in effect (meaning this test can be used) for the duration of the COVID-19 declaration under Se ction 564(b)(1) of the Act, 21 U.S.C. section 360bbb-3(b)(1), unless the authorization is terminated or revoked sooner.  Performed at [facility], [place] [address], [place], [place] [address]      Labs: BNP (last 3 results) Recent Labs    01/10/21 0430 03/18/21 0413 03/29/21 1317  BNP 359.8* 445.2* 097.3*   Basic Metabolic Panel: Recent Labs  Lab 03/31/21 0400 04/01/21 1618 04/02/21 0345 04/02/21 1447 04/03/21 1009  NA 139 137 137 137 140  K 3.3* 4.4 3.4* 3.7 3.4*  CL 100 100 98 99 100  CO2 31 28 29 29  35*  GLUCOSE 121* 112* 136* 143* 139*  BUN 14 15 16 15 14   CREATININE 1.07* 1.00 1.02* 1.20* 1.12*  CALCIUM 9.1 9.1 8.9 8.9 8.9  MG  --   --  1.8  --   --   PHOS  --   --  3.3  --   --    Liver Function Tests: Recent Labs  Lab 03/29/21 1317  AST 26  ALT 17  ALKPHOS 78  BILITOT 4.4*  PROT 7.4  ALBUMIN 3.2*   No results for input(s): LIPASE, AMYLASE in the last 168 hours. Recent Labs  Lab 03/29/21 1317 04/02/21 1447  AMMONIA 49* 96*   CBC: Recent Labs  Lab 03/29/21 1317 03/30/21 0409 03/31/21 0400 04/02/21 0345  WBC 5.8 4.5 4.2 4.4  HGB 11.7* 10.2* 10.5* 10.3*  HCT 36.0 31.6* 32.4* 31.9*  MCV 96.5 95.5 94.5 94.7  PLT DCLMP 34* 31* 34*   Cardiac Enzymes: No results for input(s): CKTOTAL, CKMB, CKMBINDEX, TROPONINI in the  last 168 hours. BNP: Invalid input(s): POCBNP CBG: Recent Labs  Lab 04/02/21 0727 04/02/21 1419  GLUCAP 181* 140*   D-Dimer No results for input(s): DDIMER in the last 72 hours. Hgb A1c No results for input(s): HGBA1C in the last 72 hours. Lipid Profile No results for input(s): CHOL, HDL, LDLCALC, TRIG, CHOLHDL, LDLDIRECT in the last 72 hours. Thyroid function studies No results for input(s): TSH, T4TOTAL, T3FREE, THYROIDAB in the last 72 hours.  Invalid input(s): FREET3 Anemia work up No results for input(s): VITAMINB12, FOLATE, FERRITIN, TIBC, IRON, RETICCTPCT in the last 72 hours. Urinalysis    Component Value Date/Time   COLORURINE YELLOW 03/18/2021 0416   APPEARANCEUR HAZY (A) 03/18/2021 0416   LABSPEC 1.017 03/18/2021 0416   PHURINE 5.0 03/18/2021 0416   GLUCOSEU NEGATIVE 03/18/2021 0416   HGBUR MODERATE (A) 03/18/2021 0416   BILIRUBINUR NEGATIVE 03/18/2021 0416   KETONESUR NEGATIVE 03/18/2021 0416   PROTEINUR 30 (A) 03/18/2021 0416   UROBILINOGEN 2.0 (H) 01/31/2015 0055   NITRITE NEGATIVE 03/18/2021 0416   LEUKOCYTESUR SMALL (A) 03/18/2021 0416   Sepsis Labs Invalid input(s): PROCALCITONIN,  WBC,  LACTICIDVEN Microbiology Recent Results (from the past 240 hour(s))  SARS CORONAVIRUS 2 (TAT 6-24 HRS) Nasopharyngeal Nasopharyngeal Swab     Status: None   Collection Time: 03/29/21  1:20 PM   Specimen: Nasopharyngeal Swab  Result Value Ref Range Status   SARS Coronavirus 2 NEGATIVE NEGATIVE Final    Comment: (NOTE) SARS-CoV-2 target nucleic acids are NOT DETECTED.  The SARS-CoV-2 RNA is generally detectable in upper and lower respiratory specimens during the acute phase of infection. Negative results do not preclude SARS-CoV-2 infection, do not rule out co-infections with other pathogens, and should not be used as the sole basis for treatment or other patient management decisions. Negative results must be combined with clinical observations, patient history,  and epidemiological information. The expected result is Negative.  Fact Sheet for Patients: SugarRoll.be  Fact Sheet for Healthcare Providers: https://www.woods-mathews.com/  This test is not yet approved or cleared by the [place] FDA and  has been authorized for detection and/or diagnosis of SARS-CoV-2 by FDA under an Emergency Use Authorization (EUA). This EUA will remain  in effect (meaning this test can be used) for the duration of the COVID-19 declaration under Se ction 564(b)(1) of the Act, 21 U.S.C. section 360bbb-3(b)(1), unless the authorization is terminated or revoked sooner.  Performed at [facility], [place] [address] [place],  [address]      Time coordinating discharge: 40 minutes  SIGNED:   Elmarie Shiley, MD  [facility]

## 2021-04-04 NOTE — Progress Notes (Signed)
Patient with discharge orders but awaiting on CM to follow up on O2.

## 2021-04-05 ENCOUNTER — Other Ambulatory Visit (HOSPITAL_COMMUNITY): Payer: Self-pay

## 2021-04-06 ENCOUNTER — Other Ambulatory Visit (HOSPITAL_BASED_OUTPATIENT_CLINIC_OR_DEPARTMENT_OTHER): Payer: Self-pay | Admitting: *Deleted

## 2021-04-06 ENCOUNTER — Encounter (HOSPITAL_BASED_OUTPATIENT_CLINIC_OR_DEPARTMENT_OTHER): Payer: Self-pay | Admitting: Family

## 2021-04-06 ENCOUNTER — Ambulatory Visit (INDEPENDENT_AMBULATORY_CARE_PROVIDER_SITE_OTHER): Payer: BC Managed Care – PPO | Admitting: Family

## 2021-04-06 ENCOUNTER — Other Ambulatory Visit: Payer: Self-pay

## 2021-04-06 VITALS — BP 98/54 | HR 79 | Ht 67.0 in | Wt 327.0 lb

## 2021-04-06 DIAGNOSIS — I10 Essential (primary) hypertension: Secondary | ICD-10-CM

## 2021-04-06 DIAGNOSIS — E1169 Type 2 diabetes mellitus with other specified complication: Secondary | ICD-10-CM

## 2021-04-06 DIAGNOSIS — I5032 Chronic diastolic (congestive) heart failure: Secondary | ICD-10-CM

## 2021-04-06 DIAGNOSIS — K7460 Unspecified cirrhosis of liver: Secondary | ICD-10-CM

## 2021-04-06 DIAGNOSIS — I4819 Other persistent atrial fibrillation: Secondary | ICD-10-CM | POA: Diagnosis not present

## 2021-04-06 DIAGNOSIS — K7581 Nonalcoholic steatohepatitis (NASH): Secondary | ICD-10-CM

## 2021-04-06 DIAGNOSIS — E876 Hypokalemia: Secondary | ICD-10-CM

## 2021-04-06 NOTE — Patient Instructions (Addendum)
Medication Instructions:  Your physician has recommended you make the following change in your medication:   CHANGE Potassium to 2 capsules (20 mEq) twice daily for two days - then return to 2 capsules (20 mEq) one time daily.   *If you need a refill on your cardiac medications before your next appointment, please call your pharmacy*   Lab Work: Your physician recommends that you return for lab work Friday for ammonia level, BMP, magnesium  If you have labs (blood work) drawn today and your tests are completely normal, you will receive your results only by: [place] (if you have MyChart) OR A paper copy in the mail If you have any lab test that is abnormal or we need to change your treatment, we will call you to review the results.   Testing/Procedures: Your EKG today showed  rate controlled atrial fibrillation   Follow-Up: At [facility], you and your health needs are our priority.  As part of our continuing mission to provide you with exceptional heart care, we have created designated Provider Care Teams.  These Care Teams include your primary Cardiologist (physician) and Advanced Practice Providers (APPs -  Physician Assistants and Nurse Practitioners) who all work together to provide you with the care you need, when you need it.  We recommend signing up for the patient portal called "MyChart".  Sign up information is provided on this After Visit Summary.  MyChart is used to connect with patients for Virtual Visits (Telemedicine).  Patients are able to view lab/test results, encounter notes, upcoming appointments, etc.  Non-urgent messages can be sent to your provider as well.   To learn more about what you can do with MyChart, go to [email or web address].    Your next appointment:   As scheduled with Dr. Oval Linsey in September   Other Instructions  Recommend weighing daily and keeping a log. Please call our office if you have weight gain of 2 pounds overnight or 5  pounds in 1 week. If you are requiring Metolazone frequently please contact our office.   Date  Time Weight                                            Heart Healthy Diet Recommendations: A low-salt diet is recommended. Meats should be grilled, baked, or boiled. Avoid fried foods. Focus on lean protein sources like fish or chicken with vegetables and fruits. The American Heart Association is a Microbiologist!  American Heart Association Diet and Lifeystyle Recommendations   Exercise recommendations: The American Heart Association recommends 150 minutes of moderate intensity exercise weekly. Try 30 minutes of moderate intensity exercise 4-5 times per week. This could include walking, jogging, or swimming.

## 2021-04-06 NOTE — Progress Notes (Signed)
Office Visit    Patient Name: [name] Date of Encounter: 04/06/2021  PCP:  Lujean Amel, [place]  Cardiologist:  Skeet Latch, MD  Advanced Practice Provider:  No care team member to display Electrophysiologist:  None   Chief Complaint    Hannah Khan is a 65 y.o. female with a hx of chronic diastolic heart failure, morbid obesity, OSA, NASH/cirrhosis, chronic thrombocytopenia, DM2, vasovagal presyncope, chronic atrial fibrillation not on anticoagulation presents today for hospital follow-up  Past Medical History    Past Medical History:  Diagnosis Date   Anemia    hx of   Arthritis    knees   Diabetes mellitus without complication ([place])    type 2   Fibromyalgia    HTN (hypertension)    Hx of colonic polyps    s/p partial colectomy   Hyperlipidemia    Joint pain    in knees and back spasms   Left leg swelling    wear compression hose   Liver cirrhosis secondary to NASH (nonalcoholic steatohepatitis) ([place]) dx nov 2014   had enlarged spleen also    Low blood pressure    bp varies   Morbid obesity ([place])    Persistent atrial fibrillation ([place])    Pneumonia 10/2016   PONV (postoperative nausea and vomiting)    in past none recent   Portal hypertension ([place])    Sleep apnea    uses cpap setting of 14   Spleen enlarged    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 [facility])    Past Surgical History:  Procedure Laterality Date   BREAST LUMPECTOMY WITH RADIOACTIVE SEED LOCALIZATION Right 08/29/2017   Procedure: RIGHT BREAST LUMPECTOMY WITH RADIOACTIVE SEEDS LOCALIZATION;  Surgeon: Erroll Luna, MD;  Location: [facility];  Service: General;  Laterality: Right;   [place]  20 yrs ago   COLECTOMY  2011   COLONOSCOPY     COLONOSCOPY N/A 09/05/2018   Procedure: COLONOSCOPY;  Surgeon: [name], MD;  Location: [facility];  Service: Endoscopy;  Laterality: N/A;   DILATATION &  CURETTAGE/HYSTEROSCOPY WITH MYOSURE N/A 05/06/2016   Procedure: DILATATION & CURETTAGE/HYSTEROSCOPY WITH MYOSURE WITH POLYPECTOMY;  Surgeon: Janyth Pupa, DO;  Location: [facility];  Service: Gynecology;  Laterality: N/A;   ESOPHAGOGASTRODUODENOSCOPY (EGD) WITH PROPOFOL N/A 09/04/2013   Procedure: ESOPHAGOGASTRODUODENOSCOPY (EGD) WITH PROPOFOL;  Surgeon: [name], MD;  Location: [facility];  Service: Endoscopy;  Laterality: N/A;   KNEE ARTHROSCOPY  yrs ago   bilateral, one done x 1, one done twice   POLYPECTOMY  09/05/2018   Procedure: POLYPECTOMY;  Surgeon: [name], MD;  Location: [facility];  Service: Endoscopy;;    Allergies  Allergies  Allergen Reactions   Celecoxib Other (See Comments)    "speech slurred" with celebrex   Shellfish Allergy Swelling and Rash    TINGLING AND SWELLING OF LIPS. LARGE WHELPS QUARTER-SIZE   Barium-Containing Compounds Other (See Comments)    TACHYCARDIA   Prednisone Other (See Comments)    TACHYCARDIA   Statins Other (See Comments)    AGGRAVATED FIBROMYALGIA   Fentanyl     Hallucinations    Ibuprofen Other (See Comments)    UNSPECIFIED SPECIFIC REACTION >> "DUE TO PLATELETS"   Tramadol     Hallucination    Tylenol [Acetaminophen] Other (See Comments)    UNSPECIFIED SPECIFIC REACTION >> "DUE TO PLATELETS"   Zetia [Ezetimibe]     Headache  History of Present Illness    Hannah Khan is a 65 y.o. female with a hx of chronic diastolic heart failure, morbid obesity, OSA, NASH/cirrhosis, chronic thrombocytopenia, DM2, vasovagal presyncope, chronic atrial fibrillation not on anticoagulation last seen while hospitalized.  ED visit 02/2020 with atrial fibrillation requiring diltiazem gtt. At clinic follow up July 2021 noted dyspnea and fluid retention, Bumex increased for short time. At follow up 04/2020 weight and edema were improved.   She saw Dr. Lovena Le 07/2020 and decided upon medical management as she was overall asymptomatic.    Se was admitted early 01/2021 with atrial fibrillation and hepatic encephalopathy. Her Metolazone was discontinued. Seen in follow up 02/17/21 doing overall well. Admitted 03/17/21-03/22/21 with fever and hypoxia treated for community acquired pneumonia requiring IV abx and IV diuresis. 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 Readmitted 03/29/21-04/04/21 with acute on chronic diastolic heart failure suspected due to dietary indiscretion. Wilder Glade was started. Bumex 42m daily continued with Metolazone PRN for weight gain. She did have episode of vasovagal presyncope associated with IV magnesium with CT head and neurology consult unremarkable.  Presents today for follow up. Weight 02/17/21 348 lbs ? 04/06/21 weight 327 lbs. Has made significant changes to her diet. Reports dyspnea on exertion is improving. LE edema much improved, her compression stockings are actually too big now and plans to buy a new pair. Checking Bp at home. Notes if her diastolic is down in the 424Oor systolic down in the 797Dshe will feel lightheaded but this has not occurred since discharge. No orthopnea, PND.   EKGs/Labs/Other Studies Reviewed:   The following studies were reviewed today:   Echocardiogram 03/20/2021:  1. Technically difficult; normal LV function; enlarged right heart.   2. Left ventricular ejection fraction, by estimation, is 60 to 65%. The  left ventricle has normal function. The left ventricle has no regional  wall motion abnormalities. There is mild left ventricular hypertrophy.  Left ventricular diastolic parameters  are indeterminate.   3. Right ventricular systolic function is normal. The right ventricular  size is moderately enlarged. There is normal pulmonary artery systolic  pressure.   4. Left atrial size was mildly dilated.   5. Right atrial size was severely dilated.   6. Large pleural effusion.   7. The mitral valve is normal in structure. Trivial mitral  valve  regurgitation. No evidence of mitral stenosis.   8. The aortic valve is calcified. Aortic valve regurgitation is not  visualized. No aortic stenosis is present.   9. Aortic dilatation noted. There is borderline dilatation of the  ascending aorta, measuring 38 mm.  10. The inferior vena cava is dilated in size with >50% respiratory  variability, suggesting right atrial pressure of 8 mmHg.  EKG:  EKG is ordered today.  The ekg ordered today demonstrates rate controlled atrial fibrillation 79 bpm with stable incomplete RBBB and prior anterior infarct.  Recent Labs: 01/10/2021: TSH 1.135 03/29/2021: ALT 17; B Natriuretic Peptide 326.1 04/02/2021: Hemoglobin 10.3; Magnesium 1.8; Platelets 34 04/03/2021: BUN 14; Creatinine, Ser 1.12; Potassium 3.4; Sodium 140  Recent Lipid Panel    Component Value Date/Time   CHOL 175 07/02/2014 1138   TRIG 93 07/02/2014 1138   HDL 59 07/02/2014 1138   CHOLHDL 3.0 07/02/2014 1138   VLDL 19 07/02/2014 1138   LDLCALC 97 07/02/2014 1138    Home Medications   Current Meds  Medication Sig   albuterol (PROVENTIL) (2.5 MG/3ML) 0.083%  nebulizer solution Take 3 mLs (2.5 mg total) by nebulization every 6 (six) hours as needed for wheezing or shortness of breath.   bumetanide (BUMEX) 2 MG tablet Take 1 tablet (2 mg total) by mouth daily.   dapagliflozin propanediol (FARXIGA) 10 MG TABS tablet Take 1 tablet (10 mg total) by mouth daily.   diltiazem (CARDIZEM CD) 360 MG 24 hr capsule Take 1 capsule (360 mg total) by mouth daily.   lactulose (CHRONULAC) 10 GM/15ML solution Take 30 mLs by mouth 3 (three) times daily.   Lancets (ONETOUCH DELICA PLUS WCBJSE83T) MISC Apply topically 3 (three) times daily.   lidocaine (LIDODERM) 5 % Place 1 patch onto the skin daily. Remove & Discard patch within 12 hours or as directed by MD   metolazone (ZAROXOLYN) 5 MG tablet Take 1 table every 3 days  as needed for more than 4 pound weight gain.   metoprolol tartrate (LOPRESSOR)  50 MG tablet Take 1 tablet (50 mg total) by mouth 2 (two) times daily.   ONETOUCH VERIO test strip 1 each 3 (three) times daily.   potassium chloride (MICRO-K) 10 MEQ CR capsule Take 2 capsules (20 mEq total) by mouth daily.   spironolactone (ALDACTONE) 25 MG tablet Take 1 tablet (25 mg total) by mouth daily.   XIFAXAN 550 MG TABS tablet Take 550 mg by mouth 2 (two) times daily.     Review of Systems      All other systems reviewed and are otherwise negative except as noted above.  Physical Exam    VS:  BP (!) 98/54   Pulse 79   Ht 5' 7"  (1.702 m)   Wt (!) 327 lb (148.3 kg)   BMI 51.22 kg/m  , BMI Body mass index is 51.22 kg/m.  Wt Readings from Last 3 Encounters:  04/06/21 (!) 327 lb (148.3 kg)  04/04/21 (!) 330 lb 0.5 oz (149.7 kg)  03/22/21 (!) 364 lb 3.2 oz (165.2 kg)     GEN: Well nourished, overweight, well developed, in no acute distress. HEENT: normal. Neck: Supple, no JVD, carotid bruits, or masses. Cardiac: IRIR, no murmurs, rubs, or gallops. No clubbing, cyanosis, edema.  Radials/PT 2+ and equal bilaterally.  Respiratory:  Respirations regular and unlabored, clear to auscultation bilaterally. GI: Soft, nontender, nondistended. MS: No deformity or atrophy. Skin: Warm and dry, no rash. Neuro:  Strength and sensation are intact. Psych: Normal affect.  Assessment & Plan    Chronic diastolic heart failure - Volume status difficult to ascertain due to body habitus. Low salt diet and <2L fluid restriction encouraged. Continue Farxiga 261m QD, Spironolactone 228mQD,  Bumex 61m461mD, PRN Metolazone. Careful titration of diuretic with monitoring of renal function. BMP this week. Daily weights encouraged - she will report gain 2 lbs overnight or 5 lbs in 1 week.   Hypotension - Asymptomatic with no lightheadedness, dizziness, near syncope, syncope. If becomes symptomatic, plan to reduce diltiazem.  Hypokalemia - Hypokalemic on hospital discharge. Potassium 48m36mID x 2  days then 48mE66mily. BMP, magneisium Friday for monitoring.   Persistent atrial fibrillation - Continue Diltiazem 360mg 68mNo anticoagulation due to high bleed risk.  OSA - CPAP compliance encouraged.   Morbid obesity - Weight loss via diet and exercise encouraged. Discussed the impact being overweight would have on cardiovascular risk.   DM2 - Continue to follow with PCP. Continue Farxiga.   Liver cirrhosis secondary to nonalcoholic steato hepatitis- Missed a few doses lactulose during recent admission. Ammonia level  for monitor. Continua to follow with GI and PCP.   Disposition: Follow up  in September  with Dr. Oval Linsey.  Signed, Loel Dubonnet, NP 04/06/2021, 4:08 PM [facility]

## 2021-04-07 ENCOUNTER — Encounter (HOSPITAL_BASED_OUTPATIENT_CLINIC_OR_DEPARTMENT_OTHER): Payer: Self-pay | Admitting: Family

## 2021-04-10 ENCOUNTER — Encounter: Payer: Self-pay | Admitting: Oncology

## 2021-04-10 LAB — BASIC METABOLIC PANEL
BUN/Creatinine Ratio: 14 (ref 12–28)
BUN: 14 mg/dL (ref 8–27)
CO2: 27 mmol/L (ref 20–29)
Calcium: 9.2 mg/dL (ref 8.7–10.3)
Chloride: 101 mmol/L (ref 96–106)
Creatinine, Ser: 1.01 mg/dL — ABNORMAL HIGH (ref 0.57–1.00)
Glucose: 149 mg/dL — ABNORMAL HIGH (ref 65–99)
Potassium: 3.8 mmol/L (ref 3.5–5.2)
Sodium: 140 mmol/L (ref 134–144)
eGFR: 62 mL/min/{1.73_m2} (ref >59)

## 2021-04-10 LAB — AMMONIA: Ammonia: 147 ug/dL (ref 34–178)

## 2021-04-10 LAB — MAGNESIUM: Magnesium: 2.2 mg/dL (ref 1.6–2.3)

## 2021-04-13 ENCOUNTER — Encounter: Payer: Self-pay | Admitting: Oncology

## 2021-04-30 ENCOUNTER — Telehealth (HOSPITAL_BASED_OUTPATIENT_CLINIC_OR_DEPARTMENT_OTHER): Payer: Self-pay | Admitting: Cardiovascular Disease

## 2021-04-30 MED ORDER — DAPAGLIFLOZIN PROPANEDIOL 10 MG PO TABS: 10.0000 mg | ORAL_TABLET | Freq: Every day | ORAL | 0 refills | Status: DC

## 2021-04-30 NOTE — Telephone Encounter (Signed)
Provided patient with 14 days of [place].  Explained we aren't able to dispense on a regular basis.  Pt verbalized understanding.  Knows medication will be available at [facility] registration [facility].

## 2021-04-30 NOTE — Telephone Encounter (Signed)
Patient calling the office for samples of medication:   1.  What medication and dosage are you requesting samples for? Farxiga  2.  Are you currently out of this medication? Only a few left

## 2021-05-17 ENCOUNTER — Other Ambulatory Visit (HOSPITAL_COMMUNITY): Payer: Self-pay

## 2021-06-10 NOTE — Progress Notes (Signed)
Cardiology Office Note  Date:  06/14/2021   ID:  Hannah Khan, DOB 22-Aug-1956, MRN [id number]  PCP:  Lujean Amel, MD  Cardiologist:   Skeet Latch, MD  Dr. Paulita Fujita: GI Dr. Alen Blew: Hematology  No chief complaint on file.   History of Present Illness:  Hannah Khan is a 65 y.o. female with hypertension, chronic atrial fibrillation, diabetes mellitus type 2, hyperlipidemia, cryptogenic cirrhosis, thrombocytopenia, OSA on CPAP, and morbid obesity who presents for follow-up.  Hannah Khan was admitted to the hospital 11/2015 with atrial fibrillation with rapid ventricular response. Given her history of portal hypertension and cirrhosis she was not anticoagulated. She was started on diltiazem for rate control and continued on her home propranolol. Her rate did not stay consistently less than 100 bpm so she was started on digoxin as well.  Echo revealed normal systolic function and her thyroid function was normal.  She was admitted to the hospital 12/979 with diastolic heart failure.  She was diuresed with IV lasix and digoxin was discontinued due to hypokalemia.  She was seen in follow up 02/2016, at which time she complained of hypotension.  Her heart rate was well-controlled but she continued to feel poorly in AF.  She was referred to Dr. Rayann Heman for consideration of a Watchman device however, he felt that she would not be a good candidate, as she will only be able to be on antiplatelets for one month and she is unlikely to remain in sinus rhythm without significant weight loss.   Hannah Khan fell and broke her R arm 09/2017.  She was also hospitalized July and August 2019 with cellulitis.  During that hospitalization she had atrial fibrillation with rapid ventricular response.  She was initially treated with a diltiazem drip and then transitioned back to oral diltiazem.  Hannah Khan was admitted 08/2018 with atrial fibrillation with RVR.  Her heart rate was 160 at home and improved to 135  with her medication.  She was given adenosine and then started on a diltiazem drip.  BNP was 266 and troponin was negative.  It was thought that the episode was triggered by dehydration from vomiting after colonoscopy and possibly viral gatroenteritis.  She had constant diarrhea after the procedure.  She was transitioned back to oral diltiazem.  She had struggled with lower extremity edema.    Hannah Khan saw Hannah Khan on 03/2020.  Her heart rate was 110 bpm and she was volume overloaded.  Bumex was increased to 4 mg twice daily for 3 days.  She followed up the following month and was doing better with her weight and edema.  She was hypotensive so diltiazem was reduced to 300 mg daily.  Hannah Khan was admitted 01/2021 with hepatic encephalopathy. Metolazone was discontinued. She was then admitted 02/2021 with community acquired pneumonia and volume overload. She was admitted 03/2021 with acute on chronic diastolic heart failure. She followed up with Hannah Gibson walker, Hannah Khan later that month and was doing well.  Today, she is accompanied by a family member. Overall, she appears well and has been enjoying retirement. Now she is able to take her medication more consistently. Currently she is taking metolazone as needed for weight fluctuations. She is maintaining a 5 lb range with her normal weight. She continues to have bilateral LE edema, but reports that "it is better than it was". Since the dosage of her diltiazem was adjusted in the hospital, she has noticed blood pressures as low as 77/47. Her heart rate  has averaged in the 80's lately. Her hemoglobin was averaging 113 while on Jardiance. This was switched to [place], and now her hemoglobin is >200. Her diabetes is managed by both her PCP and endocrinologist. She followed up with her gastroenterologist last week, who recommends a colonoscopy performed in the hospital. She reports a history of polyps and partial colectomy. It has been 5 years since her previous  colonoscopy, and she had a previous reaction to barium during a virtual procedure. A few weeks ago they traveled to [place], but she did not take her 2 L oxygen with her. She propped herself up with pillows and used her CPAP. She monitored her oxygen saturation which was normal in the mornings, and wonders if she needs to be on oxygen. She denies any palpitations, or chest pain. No lightheadedness, headaches, syncope, orthopnea, or PND. Also has no exertional symptoms.   Past Medical History:  Diagnosis Date   Anemia    hx of   Arthritis    knees   Diabetes mellitus without complication (HCC)    type 2   Fibromyalgia    HTN (hypertension)    Hx of colonic polyps    s/p partial colectomy   Hyperlipidemia    Joint pain    in knees and back spasms   Left leg swelling    wear compression hose   Liver cirrhosis secondary to NASH (nonalcoholic steatohepatitis) ([place]) dx nov 2014   had enlarged spleen also    Low blood pressure    bp varies   Morbid obesity ([place])    Persistent atrial fibrillation ([place])    Pneumonia 10/2016   PONV (postoperative nausea and vomiting)    in past none recent   Portal hypertension ([place])    Sleep apnea    uses cpap setting of 14   Spleen enlarged    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 [facility])     Past Surgical History:  Procedure Laterality Date   BREAST LUMPECTOMY WITH RADIOACTIVE SEED LOCALIZATION Right 08/29/2017   Procedure: RIGHT BREAST LUMPECTOMY WITH RADIOACTIVE SEEDS LOCALIZATION;  Surgeon: Erroll Luna, MD;  Location: [facility];  Service: General;  Laterality: Right;   [place]  20 yrs ago   COLECTOMY  2011   COLONOSCOPY     COLONOSCOPY N/A 09/05/2018   Procedure: COLONOSCOPY;  Surgeon: [name], MD;  Location: [facility];  Service: Endoscopy;  Laterality: N/A;   DILATATION & CURETTAGE/HYSTEROSCOPY WITH MYOSURE N/A 05/06/2016   Procedure: DILATATION & CURETTAGE/HYSTEROSCOPY WITH MYOSURE WITH POLYPECTOMY;   Surgeon: Janyth Pupa, DO;  Location: [facility];  Service: Gynecology;  Laterality: N/A;   ESOPHAGOGASTRODUODENOSCOPY (EGD) WITH PROPOFOL N/A 09/04/2013   Procedure: ESOPHAGOGASTRODUODENOSCOPY (EGD) WITH PROPOFOL;  Surgeon: [name], MD;  Location: [facility];  Service: Endoscopy;  Laterality: N/A;   KNEE ARTHROSCOPY  yrs ago   bilateral, one done x 1, one done twice   POLYPECTOMY  09/05/2018   Procedure: POLYPECTOMY;  Surgeon: [name], MD;  Location: [facility];  Service: Endoscopy;;     Current Outpatient Medications  Medication Sig Dispense Refill   albuterol (PROVENTIL) (2.5 MG/3ML) 0.083% nebulizer solution Take 3 mLs (2.5 mg total) by nebulization every 6 (six) hours as needed for wheezing or shortness of breath. 24 mL 0   bumetanide (BUMEX) 2 MG tablet Take 1 tablet (2 mg total) by mouth daily. 90 tablet 1   dapagliflozin propanediol (FARXIGA) 10 MG TABS tablet Take 1 tablet (10 mg total) by mouth  daily before breakfast. 14 tablet 0   lactulose (CHRONULAC) 10 GM/15ML solution Take 30 mLs by mouth 3 (three) times daily.     Lancets (ONETOUCH DELICA PLUS [id number]) MISC Apply topically 3 (three) times daily.     metolazone (ZAROXOLYN) 5 MG tablet Take 1 table every 3 days  as needed for more than 4 pound weight gain. 30 tablet 0   ONETOUCH VERIO test strip 1 each 3 (three) times daily.     potassium chloride (MICRO-K) 10 MEQ CR capsule Take 2 capsules (20 mEq total) by mouth daily. 60 capsule 2   spironolactone (ALDACTONE) 25 MG tablet Take 1 tablet (25 mg total) by mouth daily. 90 tablet 1   XIFAXAN 550 MG TABS tablet Take 550 mg by mouth 2 (two) times daily.     diltiazem (CARDIZEM CD) 300 MG 24 hr capsule Take 1 capsule (300 mg total) by mouth daily. 90 capsule 3   metoprolol tartrate (LOPRESSOR) 50 MG tablet TAKE 1 AND 1/2 TABLETS BY MOUTH TWICE A DAY 270 tablet 1   No current facility-administered medications for this visit.    Allergies:   Celecoxib, Shellfish  allergy, Barium-containing compounds, Prednisone, Statins, Fentanyl, Ibuprofen, Tramadol, Tylenol [acetaminophen], and Zetia [ezetimibe]    Social History:  The patient  reports that she quit smoking about 46 years ago. Her smoking use included cigarettes. She has a 3.00 pack-year smoking history. She has never used smokeless tobacco. She reports current alcohol use. She reports that she does not use drugs.   Family History:  [name] family history includes Cancer in her brother, father, and mother; Congestive Heart Failure in her mother; Heart failure in her mother; Hyperlipidemia in her brother and mother; Lung cancer in her father; Stroke in her mother and another family member.    ROS:   Please see the history of present illness. (+) Bilateral LE edema All other systems are reviewed and negative.    PHYSICAL EXAM: VS:  BP (!) 101/56   Pulse 86   Ht 5' 7"  (1.702 m)   Wt (!) 327 lb 8 oz (148.6 kg)   SpO2 97%   BMI 51.29 kg/m  , BMI Body mass index is 51.29 kg/m. GENERAL:  Well-appearing.  HEENT: Pupils equal round and reactive, fundi not visualized, oral mucosa unremarkable NECK:  No jugular venous distention, waveform within normal limits, carotid upstroke brisk and symmetric, no bruits LUNGS:  Clear to auscultation bilaterally HEART:  Irregularly irregular.  PMI not displaced or sustained,S1 and S2 within normal limits, no S3, no S4, no clicks, no rubs, no murmurs ABD:  Flat, positive bowel sounds normal in frequency in pitch, no bruits, no rebound, no guarding, no midline pulsatile mass, no hepatomegaly, no splenomegaly EXT:  2 plus pulses throughout, 1+ LE edema bilaterally, no cyanosis no clubbing SKIN:  No rashes no nodules NEURO:  Cranial nerves II through XII grossly intact, motor grossly intact throughout PSYCH:  Cognitively intact, oriented to person place and time   EKG:  06/14/2021: EKG is not ordered today. 06/17/20: Atrial fibrillation.  Rate 106 bpm.  Low  voltage.  LAD. 11/16/18: Atrial fibrillation.  Rate 73 bpm.  Low voltage.  LAFB  Incomplete RBBB 06/22/2018: Atrial fibrillation.  Rate 84 bpm.  Incomplete right bundle branch block.  Low voltage.  LAFB. 06/06/17: Atrial fibrillation.  Rate 76 bpm.   02/06/17: atrial fibrillation. Rate 95 bpm. Low voltage precordial leads. Incomplete right bundle branch block. 11/14/16: Atrial fibrillation. Rate 87 bpm. 06/06/16:  Atrial fibrillation. Rate 93 bpm. Low voltage.  Echo 03/20/2021:  1. Technically difficult; normal LV function; enlarged right heart.   2. Left ventricular ejection fraction, by estimation, is 60 to 65%. The  left ventricle has normal function. The left ventricle has no regional  wall motion abnormalities. There is mild left ventricular hypertrophy.  Left ventricular diastolic parameters  are indeterminate.   3. Right ventricular systolic function is normal. The right ventricular  size is moderately enlarged. There is normal pulmonary artery systolic  pressure.   4. Left atrial size was mildly dilated.   5. Right atrial size was severely dilated.   6. Large pleural effusion.   7. The mitral valve is normal in structure. Trivial mitral valve  regurgitation. No evidence of mitral stenosis.   8. The aortic valve is calcified. Aortic valve regurgitation is not  visualized. No aortic stenosis is present.   9. Aortic dilatation noted. There is borderline dilatation of the  ascending aorta, measuring 38 mm.  10. The inferior vena cava is dilated in size with >50% respiratory  variability, suggesting right atrial pressure of 8 mmHg.  Echo 12/10/15: Study Conclusions   - Procedure narrative: Transthoracic echocardiography. The study   was technically difficult, as a result of body habitus.   Intravenous contrast (Definity) was administered. - Left ventricle: The cavity size was normal. Wall thickness was   increased in a pattern of mild LVH. Systolic function was normal.   The estimated  ejection fraction was in the range of 55% to 60%.   Wall motion was normal; there were no regional wall motion   abnormalities. The study is not technically sufficient to allow   evaluation of LV diastolic function. - Mitral valve: Mildly thickened leaflets . There was trivial   regurgitation. - Left atrium: The atrium was mildly dilated. - Inferior vena cava: The vessel was dilated. The respirophasic   diameter changes were blunted (< 50%), consistent with elevated   central venous pressure. - Pericardium, extracardiac: A trivial pericardial effusion was   identified posterior to the heart.   Impressions:   - Technically difficult study. The patient was in atrial flutter   during the study. The LVEF is 55-60%, mild LVH, grossly normal   wall motion, trivial MR, mild LAE, dilated IVC, trivial posterior   pericardial effusion.  Recent Labs: 01/10/2021: TSH 1.135 03/29/2021: ALT 17; B Natriuretic Peptide 326.1 04/02/2021: Hemoglobin 10.3; Platelets 34 04/09/2021: BUN 14; Creatinine, Ser 1.01; Magnesium 2.2; Potassium 3.8; Sodium 140    Lipid Panel    Component Value Date/Time   CHOL 175 07/02/2014 1138   TRIG 93 07/02/2014 1138   HDL 59 07/02/2014 1138   CHOLHDL 3.0 07/02/2014 1138   VLDL 19 07/02/2014 1138   LDLCALC 97 07/02/2014 1138    Wt Readings from Last 3 Encounters:  06/14/21 (!) 327 lb 8 oz (148.6 kg)  04/06/21 (!) 327 lb (148.3 kg)  04/04/21 (!) 330 lb 0.5 oz (149.7 kg)     ASSESSMENT AND PLAN: Acute on chronic combined systolic and diastolic CHF (congestive heart failure) (HCC) Volume status is much better.  She notes that her glucose was better controlled on Jardiance than Farxiga.  From our standpoint it doesn't matter which she is taking.  She will discuss with Dr. Buddy Duty.  Continue Bumex 60m daily.  She is using spironolactone.  We will increase metoprolol to 754mbid.    Permanent atrial fibrillation (HCTiffinMs. JoMouselemains in atrial fibrillation.  Rates are  well-controlled today and have been pretty good at home.  Diltiazem was increased to 360 mg.  However she is also struggled with hypotension with systolics in the 26J and below.  We will reduce the diltiazem back to 300 mg and increase her metoprolol to 75 mg twice a day.  She is not a candidate for anticoagulation due to GI bleeding and cirrhosis.  She is not a good candidate for watchman because she is unable to be on anything coagulation or antiplatelets even for short period of time and morbid obesity.  She did not tolerate digoxin and is not a good candidate for antiarrhythmics.  She has been evaluated by EP.  Continue with rate control.  Obstructive sleep apnea Continue CPAP.  [place] Ms. Castelluccio has history of partial colon resection and needs a colonoscopy.  She is complicated for sedation and recommend that she be evaluated by anesthesia before the procedure.  Regarding her diuretics, would hold both Bumex and metolazone on the day of her prep in the morning of her procedure.  Recommend that she continue taking her metoprolol, spironolactone, and diltiazem as scheduled.   Current medicines are reviewed at length with the patient today.  The patient does not have concerns regarding medicines.   The following changes have been made:  none  Labs/ tests ordered today include:   No orders of the defined types were placed in this encounter.     Disposition:   FU with Lisa-Marie Rueger C. Oval Linsey, MD, [facility] in 2-3 months.  I,Mathew Stumpf,acting as a Education administrator for Skeet Latch, MD.,have documented all relevant documentation on the behalf of Skeet Latch, MD,as directed by  Skeet Latch, MD while in the presence of Skeet Latch, MD.  I, [place] Oval Linsey, MD have reviewed all documentation for this visit.  The documentation of the exam, diagnosis, procedures, and orders on 06/14/2021 are all accurate and complete.   Signed, Mallori Araque C. Oval Linsey, MD, [facility]  06/14/2021 12:40 PM    [facility]

## 2021-06-14 ENCOUNTER — Other Ambulatory Visit: Payer: Self-pay

## 2021-06-14 ENCOUNTER — Ambulatory Visit (INDEPENDENT_AMBULATORY_CARE_PROVIDER_SITE_OTHER): Payer: Medicare Other | Admitting: Cardiovascular Disease

## 2021-06-14 ENCOUNTER — Encounter (HOSPITAL_BASED_OUTPATIENT_CLINIC_OR_DEPARTMENT_OTHER): Payer: Self-pay | Admitting: Cardiovascular Disease

## 2021-06-14 DIAGNOSIS — G4733 Obstructive sleep apnea (adult) (pediatric): Secondary | ICD-10-CM | POA: Diagnosis not present

## 2021-06-14 DIAGNOSIS — I5043 Acute on chronic combined systolic (congestive) and diastolic (congestive) heart failure: Secondary | ICD-10-CM | POA: Diagnosis not present

## 2021-06-14 DIAGNOSIS — K921 Melena: Secondary | ICD-10-CM | POA: Diagnosis not present

## 2021-06-14 DIAGNOSIS — I4821 Permanent atrial fibrillation: Secondary | ICD-10-CM

## 2021-06-14 MED ORDER — METOPROLOL TARTRATE 50 MG PO TABS: ORAL_TABLET | ORAL | 1 refills | Status: DC

## 2021-06-14 MED ORDER — DILTIAZEM HCL ER COATED BEADS 300 MG PO CP24: 300.0000 mg | ORAL_CAPSULE | Freq: Every day | ORAL | 3 refills | Status: DC

## 2021-06-14 NOTE — Assessment & Plan Note (Signed)
Continue CPAP.  

## 2021-06-14 NOTE — Assessment & Plan Note (Signed)
Volume status is much better.  She notes that her glucose was better controlled on Jardiance than Farxiga.  From our standpoint it doesn't matter which she is taking.  She will discuss with Dr. Buddy Duty.  Continue Bumex 15m daily.  She is using spironolactone.  We will increase metoprolol to 778mbid.

## 2021-06-14 NOTE — Assessment & Plan Note (Signed)
Hannah Khan has history of partial colon resection and needs a colonoscopy.  She is complicated for sedation and recommend that she be evaluated by anesthesia before the procedure.  Regarding her diuretics, would hold both Bumex and metolazone on the day of her prep in the morning of her procedure.  Recommend that she continue taking her metoprolol, spironolactone, and diltiazem as scheduled.

## 2021-06-14 NOTE — Assessment & Plan Note (Signed)
Ms. Shoun remains in atrial fibrillation.  Rates are well-controlled today and have been pretty good at home.  Diltiazem was increased to 360 mg.  However she is also struggled with hypotension with systolics in the 73Z and below.  We will reduce the diltiazem back to 300 mg and increase her metoprolol to 75 mg twice a day.  She is not a candidate for anticoagulation due to GI bleeding and cirrhosis.  She is not a good candidate for watchman because she is unable to be on anything coagulation or antiplatelets even for short period of time and morbid obesity.  She did not tolerate digoxin and is not a good candidate for antiarrhythmics.  She has been evaluated by EP.  Continue with rate control.

## 2021-06-14 NOTE — Patient Instructions (Addendum)
Medication Instructions:  DECREASE DILTIAZEM TO 300 MG DAILY   INCREASE YOUR METOPROLOL TO 75 MG TWICE A DAY   *If you need a refill on your cardiac medications before your next appointment, please call your pharmacy*  Lab Work: NONE  Testing/Procedures: NONE  Follow-Up: At [facility], you and your health needs are our priority.  As part of our continuing mission to provide you with exceptional heart care, we have created designated Provider Care Teams.  These Care Teams include your primary Cardiologist (physician) and Advanced Practice Providers (APPs -  Physician Assistants and Nurse Practitioners) who all work together to provide you with the care you need, when you need it.  We recommend signing up for the patient portal called "MyChart".  Sign up information is provided on this After Visit Summary.  MyChart is used to connect with patients for Virtual Visits (Telemedicine).  Patients are able to view lab/test results, encounter notes, upcoming appointments, etc.  Non-urgent messages can be sent to your provider as well.   To learn more about what you can do with MyChart, go to [email or web address].    Your next appointment:   3 month(s)  The format for your next appointment:   In Person  Provider:   Skeet Latch, MD or [name], NP  Other Instructions  DR [place] RECOMMENDS YOU BE SEEN BY ANAESTHESIA PRIOR TO COLONOSCOPY    HOLD METOLAZONE AND BUMETANIDE DAY OF COLONOSCOPY

## 2021-08-14 ENCOUNTER — Other Ambulatory Visit: Payer: Self-pay | Admitting: Cardiovascular Disease

## 2021-08-15 ENCOUNTER — Other Ambulatory Visit: Payer: Self-pay | Admitting: General Practice

## 2021-09-16 ENCOUNTER — Other Ambulatory Visit: Payer: Self-pay

## 2021-09-16 ENCOUNTER — Ambulatory Visit (INDEPENDENT_AMBULATORY_CARE_PROVIDER_SITE_OTHER): Payer: Medicare Other | Admitting: Cardiovascular Disease

## 2021-09-16 ENCOUNTER — Encounter (HOSPITAL_BASED_OUTPATIENT_CLINIC_OR_DEPARTMENT_OTHER): Payer: Self-pay | Admitting: Cardiovascular Disease

## 2021-09-16 VITALS — BP 88/44 | HR 80 | Ht 67.0 in | Wt 333.5 lb

## 2021-09-16 DIAGNOSIS — G4733 Obstructive sleep apnea (adult) (pediatric): Secondary | ICD-10-CM | POA: Diagnosis not present

## 2021-09-16 DIAGNOSIS — I10 Essential (primary) hypertension: Secondary | ICD-10-CM | POA: Diagnosis not present

## 2021-09-16 DIAGNOSIS — Z79899 Other long term (current) drug therapy: Secondary | ICD-10-CM

## 2021-09-16 DIAGNOSIS — I4821 Permanent atrial fibrillation: Secondary | ICD-10-CM | POA: Diagnosis not present

## 2021-09-16 DIAGNOSIS — K7581 Nonalcoholic steatohepatitis (NASH): Secondary | ICD-10-CM | POA: Diagnosis not present

## 2021-09-16 DIAGNOSIS — I5032 Chronic diastolic (congestive) heart failure: Secondary | ICD-10-CM

## 2021-09-16 DIAGNOSIS — K7460 Unspecified cirrhosis of liver: Secondary | ICD-10-CM

## 2021-09-16 LAB — COMPREHENSIVE METABOLIC PANEL
ALT: 17 IU/L (ref 0–32)
AST: 27 IU/L (ref 0–40)
Albumin/Globulin Ratio: 1.2 (ref 1.2–2.2)
Albumin: 3.7 g/dL — ABNORMAL LOW (ref 3.8–4.8)
Alkaline Phosphatase: 104 IU/L (ref 44–121)
BUN/Creatinine Ratio: 18 (ref 12–28)
BUN: 23 mg/dL (ref 8–27)
Bilirubin Total: 2.3 mg/dL — ABNORMAL HIGH (ref 0.0–1.2)
CO2: 25 mmol/L (ref 20–29)
Calcium: 8.9 mg/dL (ref 8.7–10.3)
Chloride: 98 mmol/L (ref 96–106)
Creatinine, Ser: 1.25 mg/dL — ABNORMAL HIGH (ref 0.57–1.00)
Globulin, Total: 3 g/dL (ref 1.5–4.5)
Glucose: 203 mg/dL — ABNORMAL HIGH (ref 70–99)
Potassium: 3.3 mmol/L — ABNORMAL LOW (ref 3.5–5.2)
Sodium: 135 mmol/L (ref 134–144)
Total Protein: 6.7 g/dL (ref 6.0–8.5)
eGFR: 48 mL/min/{1.73_m2} — ABNORMAL LOW (ref >59)

## 2021-09-16 MED ORDER — SPIRONOLACTONE 25 MG PO TABS
12.5000 mg | ORAL_TABLET | Freq: Two times a day (BID) | ORAL | 3 refills | Status: DC
Start: 2021-09-16 — End: 2021-11-16

## 2021-09-16 MED ORDER — DILTIAZEM HCL ER COATED BEADS 240 MG PO CP24: 240.0000 mg | ORAL_CAPSULE | Freq: Every day | ORAL | 3 refills | Status: DC

## 2021-09-16 MED ORDER — METOLAZONE 5 MG PO TABS: ORAL_TABLET | ORAL | 2 refills | Status: DC

## 2021-09-16 NOTE — Patient Instructions (Signed)
Medication Instructions:  DECREASE: Diltiazem to 240 mg daily DECREASE: Spironolactone 12.5 mg twice a day  *If you need a refill on your cardiac medications before your next appointment, please call your pharmacy*   Lab Work: Your provider has recommended lab work today ([place]). Please have this collected at [facility] [place]. The lab is open 8:00 am - 4:30 pm. Please avoid 12:00p - 1:00p for lunch hour. You do not need an appointment. Please go to [address] [place], [place] [id number]. This is in the Primary Care office on the 3rd floor, let them know you are there for blood work and they will direct you to the lab.  If you have labs (blood work) drawn today and your tests are completely normal, you will receive your results only by: [place] (if you have MyChart) OR A paper copy in the mail If you have any lab test that is abnormal or we need to change your treatment, we will call you to review the results.   Testing/Procedures: None ordered today   Follow-Up: At [facility] you and your health needs are our priority.  As part of our continuing mission to provide you with exceptional heart care, we have created designated Provider Care Teams.  These Care Teams include your primary Cardiologist (physician) and Advanced Practice Providers (APPs -  Physician Assistants and Nurse Practitioners) who all work together to provide you with the care you need, when you need it.  We recommend signing up for the patient portal called "MyChart".  Sign up information is provided on this After Visit Summary.  MyChart is used to connect with patients for Virtual Visits (Telemedicine).  Patients are able to view lab/test results, encounter notes, upcoming appointments, etc.  Non-urgent messages can be sent to your provider as well.   To learn more about what you can do with MyChart, go to [email or web address].    Your next appointment:   2 month(s)  The  format for your next appointment:   In Person  Provider: [name], NP or Sheela Stack, NP  Your physician recommends that you schedule a follow-up appointment in 6 months with Dr. Oval Linsey

## 2021-09-16 NOTE — Progress Notes (Signed)
Cardiology Office Note  Date:  09/16/2021   ID:  Hannah Khan, DOB 1956-08-09, MRN [id number]  PCP:  Lujean Amel, MD  Cardiologist:   Skeet Latch, MD  Dr. Paulita Fujita: GI Dr. Alen Blew: Hematology  No chief complaint on file.   History of Present Illness:  Hannah Khan is a 65 y.o. female with hypertension, chronic atrial fibrillation, diabetes mellitus type 2, hyperlipidemia, cryptogenic cirrhosis, thrombocytopenia, OSA on CPAP, and morbid obesity who presents for follow-up.  [name] was admitted to the hospital 11/2015 with atrial fibrillation with rapid ventricular response. Given her history of portal hypertension and cirrhosis she was not anticoagulated. She was started on diltiazem for rate control and continued on her home propranolol. Her rate did not stay consistently less than 100 bpm so she was started on digoxin as well.  Echo revealed normal systolic function and her thyroid function was normal.  She was admitted to the hospital 12/5623 with diastolic heart failure.  She was diuresed with IV lasix and digoxin was discontinued due to hypokalemia.  She was seen in follow up 02/2016, at which time she complained of hypotension.  Her heart rate was well-controlled but she continued to feel poorly in AF.  She was referred to Dr. Rayann Heman for consideration of a Watchman device however, he felt that she would not be a good candidate, as she will only be able to be on antiplatelets for one month and she is unlikely to remain in sinus rhythm without significant weight loss.   [name] fell and broke her R arm 09/2017.  She was also hospitalized July and August 2019 with cellulitis.  During that hospitalization she had atrial fibrillation with rapid ventricular response.  She was initially treated with a diltiazem drip and then transitioned back to oral diltiazem.  [name] was admitted 08/2018 with atrial fibrillation with RVR.  Her heart rate was 160 at home and improved to 135 with  her medication.  She was given adenosine and then started on a diltiazem drip.  BNP was 266 and troponin was negative.  It was thought that the episode was triggered by dehydration from vomiting after colonoscopy and possibly viral gatroenteritis.  She had constant diarrhea after the procedure.  She was transitioned back to oral diltiazem.  She had struggled with lower extremity edema.    [name] saw [name] on 03/2020.  Her heart rate was 110 bpm and she was volume overloaded.  Bumex was increased to 4 mg twice daily for 3 days.  She followed up the following month and was doing better with her weight and edema.  She was hypotensive so diltiazem was reduced to 300 mg daily.  Ms. [name] was admitted 01/2021 with hepatic encephalopathy. Metolazone was discontinued. She was then admitted 02/2021 with community acquired pneumonia and volume overload. She was admitted 03/2021 with acute on chronic diastolic heart failure. She followed up with [name], NP later that month and was doing well.  At her last appointment [name] reported maintaining a stable weight and general improvement in her LE edema. Since the dosage of her diltiazem was adjusted in the hospital, she had noticed blood pressures as low as 77/47. She was better managing her volume status since retirement because she was able to take her diuretics. She had melena and an in-hospital colonoscopy was recommended. Today, she is accompanied by her husband. Overall she is feeling okay. When she first wakes up in the morning she is sometimes lightheaded  due to low blood pressures. She notes today's reading of 88/44 is typical for her, almost daily. Once her highest reading was 100/53. She states her breathing is pretty good lately. Normally she no longer notices any palpitations. She is taking metolazone about once a week, but never 2 days in a row. Over Thanksgiving they returned from a cruise and were infected with COVID. She is doing  better now and continues to enjoy retirement. Sometimes she stays active, and rests other days. In January she plans to join a gym and exercise in the pool. The in-hospital colonoscopy was never completed due to her COVID infection. She denies any chest pain. No headaches, syncope, orthopnea, PND, or exertional symptoms.  Past Medical History:  Diagnosis Date   Anemia    hx of   Arthritis    knees   Diabetes mellitus without complication (HCC)    type 2   Fibromyalgia    HTN (hypertension)    Hx of colonic polyps    s/p partial colectomy   Hyperlipidemia    Joint pain    in knees and back spasms   Left leg swelling    wear compression hose   Liver cirrhosis secondary to NASH (nonalcoholic steatohepatitis) ([place]) dx nov 2014   had enlarged spleen also    Low blood pressure    bp varies   Morbid obesity ([place])    Persistent atrial fibrillation ([place])    Pneumonia 10/2016   PONV (postoperative nausea and vomiting)    in past none recent   Portal hypertension ([place])    Sleep apnea    uses cpap setting of 14   Spleen enlarged    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 [facility])     Past Surgical History:  Procedure Laterality Date   BREAST LUMPECTOMY WITH RADIOACTIVE SEED LOCALIZATION Right 08/29/2017   Procedure: RIGHT BREAST LUMPECTOMY WITH RADIOACTIVE SEEDS LOCALIZATION;  Surgeon: Erroll Luna, MD;  Location: [place];  Service: General;  Laterality: Right;   [place]  20 yrs ago   COLECTOMY  2011   COLONOSCOPY     COLONOSCOPY N/A 09/05/2018   Procedure: COLONOSCOPY;  Surgeon: [name], MD;  Location: [facility];  Service: Endoscopy;  Laterality: N/A;   DILATATION & CURETTAGE/HYSTEROSCOPY WITH MYOSURE N/A 05/06/2016   Procedure: DILATATION & CURETTAGE/HYSTEROSCOPY WITH MYOSURE WITH POLYPECTOMY;  Surgeon: Janyth Pupa, DO;  Location: [facility];  Service: Gynecology;  Laterality: N/A;   ESOPHAGOGASTRODUODENOSCOPY (EGD) WITH PROPOFOL N/A  09/04/2013   Procedure: ESOPHAGOGASTRODUODENOSCOPY (EGD) WITH PROPOFOL;  Surgeon: [name], MD;  Location: [facility];  Service: Endoscopy;  Laterality: N/A;   KNEE ARTHROSCOPY  yrs ago   bilateral, one done x 1, one done twice   POLYPECTOMY  09/05/2018   Procedure: POLYPECTOMY;  Surgeon: [name], MD;  Location: [facility];  Service: Endoscopy;;     Current Outpatient Medications  Medication Sig Dispense Refill   albuterol (PROVENTIL) (2.5 MG/3ML) 0.083% nebulizer solution Take 3 mLs (2.5 mg total) by nebulization every 6 (six) hours as needed for wheezing or shortness of breath. 24 mL 0   bumetanide (BUMEX) 2 MG tablet TAKE 1 TABLET BY MOUTH EVERY DAY 90 tablet 1   empagliflozin (JARDIANCE) 25 MG TABS tablet Take 25 mg by mouth daily.     lactulose (CHRONULAC) 10 GM/15ML solution Take 30 mLs by mouth 3 (three) times daily.     Lancets (ONETOUCH DELICA PLUS [id number]) MISC Apply topically 3 (three) times daily.  metoprolol tartrate (LOPRESSOR) 50 MG tablet Take 50 mg by mouth 2 (two) times daily.     ONETOUCH VERIO test strip 1 each 3 (three) times daily.     potassium chloride (MICRO-K) 10 MEQ CR capsule Take 30 mEq by mouth 2 (two) times daily.     XIFAXAN 550 MG TABS tablet Take 550 mg by mouth 2 (two) times daily.     diltiazem (CARDIZEM CD) 240 MG 24 hr capsule Take 1 capsule (240 mg total) by mouth daily. 90 capsule 3   metolazone (ZAROXOLYN) 5 MG tablet Take 1 table every 3 days  as needed for more than 4 pound weight gain. 30 tablet 2   spironolactone (ALDACTONE) 25 MG tablet Take 0.5 tablets (12.5 mg total) by mouth 2 (two) times daily. 90 tablet 3   No current facility-administered medications for this visit.    Allergies:   Celecoxib, Shellfish allergy, Barium-containing compounds, Prednisone, Statins, Fentanyl, Ibuprofen, Tramadol, Tylenol [acetaminophen], and Zetia [ezetimibe]    Social History:  The patient  reports that she quit smoking about 47 years  ago. Her smoking use included cigarettes. She has a 3.00 pack-year smoking history. She has never used smokeless tobacco. She reports current alcohol use. She reports that she does not use drugs.   Family History:  [name] family history includes Cancer in her brother, father, and mother; Congestive Heart Failure in her mother; Heart failure in her mother; Hyperlipidemia in her brother and mother; Lung cancer in her father; Stroke in her mother and another family member.    ROS:   Please see the history of present illness. (+) Lightheadedness (+) Bilateral LE edema All other systems are reviewed and negative.    PHYSICAL EXAM: VS:  BP (!) 88/44    Pulse 80    Ht 5' 7"  (1.702 m)    Wt (!) 333 lb 8 oz (151.3 kg)    BMI 52.23 kg/m  , BMI Body mass index is 52.23 kg/m. GENERAL:  Well-appearing.  HEENT: Pupils equal round and reactive, fundi not visualized, oral mucosa unremarkable NECK:  No jugular venous distention, waveform within normal limits, carotid upstroke brisk and symmetric, no bruits LUNGS:  Clear to auscultation bilaterally HEART:  Irregularly irregular.  PMI not displaced or sustained,S1 and S2 within normal limits, no S3, no S4, no clicks, no rubs, II/VI systolic murmur at the LUSB ABD:  Flat, positive bowel sounds normal in frequency in pitch, no bruits, no rebound, no guarding, no midline pulsatile mass, no hepatomegaly, no splenomegaly EXT:  2 plus pulses throughout, trace LE edema bilaterally, no cyanosis no clubbing SKIN:  No rashes no nodules NEURO:  Cranial nerves II through XII grossly intact, motor grossly intact throughout PSYCH:  Cognitively intact, oriented to person place and time  EKG:  09/16/2021: Atrial fibrillation. Rate 81 bpm. PVC. iRBBB. 06/14/2021: EKG is not ordered today. 06/17/20: Atrial fibrillation.  Rate 106 bpm.  Low voltage.  LAD. 11/16/18: Atrial fibrillation.  Rate 73 bpm.  Low voltage.  LAFB  Incomplete RBBB 06/22/2018: Atrial fibrillation.   Rate 84 bpm.  Incomplete right bundle branch block.  Low voltage.  LAFB. 06/06/17: Atrial fibrillation.  Rate 76 bpm.   02/06/17: atrial fibrillation. Rate 95 bpm. Low voltage precordial leads. Incomplete right bundle branch block. 11/14/16: Atrial fibrillation. Rate 87 bpm. 06/06/16: Atrial fibrillation. Rate 93 bpm. Low voltage.  Echo 03/20/2021:  1. Technically difficult; normal LV function; enlarged right heart.   2. Left ventricular ejection fraction, by estimation,  is 60 to 65%. The  left ventricle has normal function. The left ventricle has no regional  wall motion abnormalities. There is mild left ventricular hypertrophy.  Left ventricular diastolic parameters  are indeterminate.   3. Right ventricular systolic function is normal. The right ventricular  size is moderately enlarged. There is normal pulmonary artery systolic  pressure.   4. Left atrial size was mildly dilated.   5. Right atrial size was severely dilated.   6. Large pleural effusion.   7. The mitral valve is normal in structure. Trivial mitral valve  regurgitation. No evidence of mitral stenosis.   8. The aortic valve is calcified. Aortic valve regurgitation is not  visualized. No aortic stenosis is present.   9. Aortic dilatation noted. There is borderline dilatation of the  ascending aorta, measuring 38 mm.  10. The inferior vena cava is dilated in size with >50% respiratory  variability, suggesting right atrial pressure of 8 mmHg.  Echo 12/10/15: Study Conclusions   - Procedure narrative: Transthoracic echocardiography. The study   was technically difficult, as a result of body habitus.   Intravenous contrast (Definity) was administered. - Left ventricle: The cavity size was normal. Wall thickness was   increased in a pattern of mild LVH. Systolic function was normal.   The estimated ejection fraction was in the range of 55% to 60%.   Wall motion was normal; there were no regional wall motion   abnormalities.  The study is not technically sufficient to allow   evaluation of LV diastolic function. - Mitral valve: Mildly thickened leaflets . There was trivial   regurgitation. - Left atrium: The atrium was mildly dilated. - Inferior vena cava: The vessel was dilated. The respirophasic   diameter changes were blunted (< 50%), consistent with elevated   central venous pressure. - Pericardium, extracardiac: A trivial pericardial effusion was   identified posterior to the heart.   Impressions:   - Technically difficult study. The patient was in atrial flutter   during the study. The LVEF is 55-60%, mild LVH, grossly normal   wall motion, trivial MR, mild LAE, dilated IVC, trivial posterior   pericardial effusion.  Recent Labs: 01/10/2021: TSH 1.135 03/29/2021: ALT 17; B Natriuretic Peptide 326.1 04/02/2021: Hemoglobin 10.3; Platelets 34 04/09/2021: BUN 14; Creatinine, Ser 1.01; Magnesium 2.2; Potassium 3.8; Sodium 140    Lipid Panel    Component Value Date/Time   CHOL 175 07/02/2014 1138   TRIG 93 07/02/2014 1138   HDL 59 07/02/2014 1138   CHOLHDL 3.0 07/02/2014 1138   VLDL 19 07/02/2014 1138   LDLCALC 97 07/02/2014 1138    Wt Readings from Last 3 Encounters:  09/16/21 (!) 333 lb 8 oz (151.3 kg)  06/14/21 (!) 327 lb 8 oz (148.6 kg)  04/06/21 (!) 327 lb (148.3 kg)     ASSESSMENT AND PLAN:  Essential hypertension BP has been running very low, especially in the AM.  We will reduce diltiazem to 274m  She will split spironolactone to 12.536mbid.  Continue metoprolol.  Obstructive sleep apnea Continue CPAP  Liver cirrhosis secondary to NASH ([place] ste[place]patitis[place]  Will divide into 12.5 mg bid to prevent hypotension.  Morbid obesity- She plans to join a gym starting January.  [place]  Permanent atrial fibrillation (HCC) Rate is well-controlled but she is hypotensive.  Reduce diltiazem to 24031mdaily.  Continue metoprolol.  She is unable to be anticoagulated 2/2  cirrhosis and bleeding risk.  Chronic diastolic heart failure (HCC) Her volume status is much better on Bumex and she uses metolazone approximately once per week.  Check a basic metabolic panel, as her creatinine was up to 1.6 when checked 06/2021.    Current medicines are reviewed at length with the patient today.  The patient does not have concerns regarding medicines.   The following changes have been made:  none  Labs/ tests ordered today include:   Orders Placed This Encounter  Procedures   Comprehensive metabolic panel   EKG 45-GYBW     Disposition:    FU with APP in 2 months. FU with Katee Wentland C. Oval Linsey, MD, [facility] in 6 months.  I,Mathew Stumpf,acting as a Education administrator for Skeet Latch, MD.,have documented all relevant documentation on the behalf of Skeet Latch, MD,as directed by  Skeet Latch, MD while in the presence of Skeet Latch, MD.  I, Marissa Oval Linsey, MD have reviewed all documentation for this visit.  The documentation of the exam, diagnosis, procedures, and orders on 09/16/2021 are all accurate and complete.   Signed, Zacaria Pousson C. Oval Linsey, MD, [facility]  09/16/2021 9:08 AM    [place]

## 2021-09-16 NOTE — Assessment & Plan Note (Signed)
Continue sprionolactone.  Will divide into 12.5 mg bid to prevent hypotension.

## 2021-09-16 NOTE — Assessment & Plan Note (Signed)
Rate is well-controlled but she is hypotensive.  Reduce diltiazem to 2106m daily.  Continue metoprolol.  She is unable to be anticoagulated 2/2 cirrhosis and bleeding risk.

## 2021-09-16 NOTE — Assessment & Plan Note (Signed)
Her volume status is much better on Bumex and she uses metolazone approximately once per week.  Check a basic metabolic panel, as her creatinine was up to 1.6 when checked 06/2021.

## 2021-09-16 NOTE — Assessment & Plan Note (Signed)
BP has been running very low, especially in the AM.  We will reduce diltiazem to 229m  She will split spironolactone to 12.5110mbid.  Continue metoprolol.

## 2021-09-16 NOTE — Assessment & Plan Note (Signed)
Continue CPAP.  

## 2021-09-16 NOTE — Assessment & Plan Note (Signed)
She plans to join a gym starting January.  Wants to be more mobile for an [place] in June.

## 2021-10-07 ENCOUNTER — Other Ambulatory Visit: Payer: Self-pay | Admitting: Gastroenterology

## 2021-10-07 DIAGNOSIS — K7469 Other cirrhosis of liver: Secondary | ICD-10-CM

## 2021-10-11 ENCOUNTER — Encounter: Payer: Self-pay | Admitting: Oncology

## 2021-10-18 ENCOUNTER — Other Ambulatory Visit: Payer: Self-pay | Admitting: Home Modifications

## 2021-10-18 ENCOUNTER — Other Ambulatory Visit: Payer: Self-pay

## 2021-10-18 ENCOUNTER — Ambulatory Visit
Admission: RE | Admit: 2021-10-18 | Discharge: 2021-10-18 | Disposition: A | Discharge status: Home / Self Care | Payer: Medicare PPO | Source: Ambulatory Visit | Attending: Gastroenterology | Admitting: Gastroenterology

## 2021-10-18 ENCOUNTER — Other Ambulatory Visit: Payer: Self-pay | Admitting: Family Medicine

## 2021-10-18 DIAGNOSIS — K7469 Other cirrhosis of liver: Secondary | ICD-10-CM

## 2021-10-29 ENCOUNTER — Telehealth (HOSPITAL_BASED_OUTPATIENT_CLINIC_OR_DEPARTMENT_OTHER): Payer: Self-pay | Admitting: *Deleted

## 2021-10-29 NOTE — Telephone Encounter (Signed)
-----   Message from Skeet Latch, MD sent at 10/27/2021  1:14 PM EST ----- Kidney function mildly elevated but stable.  Potassium is low.  Increase potassium to 40 mEq.

## 2021-10-29 NOTE — Telephone Encounter (Signed)
Left detailed message, ok per DPR and released in mychart  Left message to call if any questions  Per chart patient currently taking K+ 30 meq bid, increased to 40 meq bid

## 2021-11-10 ENCOUNTER — Telehealth: Payer: Self-pay | Admitting: Cardiovascular Disease

## 2021-11-10 ENCOUNTER — Telehealth (HOSPITAL_BASED_OUTPATIENT_CLINIC_OR_DEPARTMENT_OTHER): Payer: Self-pay

## 2021-11-10 NOTE — Telephone Encounter (Signed)
Patient returned call and results reviewed with patient!   Appointment with [name], NP on Tuesday patient is unsure if she will make it, but she will let us know- patient may potentially have to drive to [place] because her brother is having multiple surgeries within the next few days!    Blood pressures are still low 71/47 70/41   Patient is feeling a headache and lightheaded and dizzy and fell forward, but managed to catch herself  Patient states when she bends over her head has a burning sensation   Patient states that her medications were recently adjusted by Dr. Oval Linsey because her blood pressures were so low

## 2021-11-10 NOTE — Telephone Encounter (Signed)
° °  Pt is returning call regarding her lab work, also she said she is having issue with her BP and would like to speak with a nurse, she did not provide details of her BP

## 2021-11-10 NOTE — Telephone Encounter (Signed)
Consulted with Dr. Oval Linsey in clinic who recommends "stop spironolactone and do not take until Systolic Blood pressure >94"    RN called patient with recommendations and patient is agreeable and verbalizes understanding!

## 2021-11-10 NOTE — Telephone Encounter (Signed)
Called results to patient and left results on VM (ok per DPR), instructions left to call office back if patient has any questions!     "Skeet Latch, MD  10/27/2021  1:14 PM EST  Kidney function mildly elevated but stable.  Potassium is low.  Increase potassium to 40 mEq."

## 2021-11-15 NOTE — Progress Notes (Signed)
Office Visit    Patient Name: Hannah Khan Date of Encounter: 11/15/2021  PCP:  Lujean Amel, [place]  Cardiologist:  Skeet Latch, MD  Advanced Practice Provider:  No care team member to display Electrophysiologist:  None   Chief Complaint    Hannah Khan is a 66 y.o. female with a hx of chronic diastolic heart failure, morbid obesity, OSA, NASH/cirrhosis, chronic thrombocytopenia, DM2, vasovagal presyncope, chronic atrial fibrillation not on anticoagulation presents today for heart failure follow up.  Past Medical History    Past Medical History:  Diagnosis Date   Anemia    hx of   Arthritis    knees   Diabetes mellitus without complication (HCC)    type 2   Fibromyalgia    HTN (hypertension)    Hx of colonic polyps    s/p partial colectomy   Hyperlipidemia    Joint pain    in knees and back spasms   Left leg swelling    wear compression hose   Liver cirrhosis secondary to NASH (nonalcoholic steatohepatitis) ([place]) dx nov 2014   had enlarged spleen also    Low blood pressure    bp varies   Morbid obesity ([place])    Persistent atrial fibrillation ([place])    Pneumonia 10/2016   PONV (postoperative nausea and vomiting)    in past none recent   Portal hypertension ([place])    Sleep apnea    uses cpap setting of 14   Spleen enlarged    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 [facility])    Past Surgical History:  Procedure Laterality Date   BREAST LUMPECTOMY WITH RADIOACTIVE SEED LOCALIZATION Right 08/29/2017   Procedure: RIGHT BREAST LUMPECTOMY WITH RADIOACTIVE SEEDS LOCALIZATION;  Surgeon: Erroll Luna, MD;  Location: [place];  Service: General;  Laterality: Right;   [place]  20 yrs ago   COLECTOMY  2011   COLONOSCOPY     COLONOSCOPY N/A 09/05/2018   Procedure: COLONOSCOPY;  Surgeon: [name], MD;  [facility];  Service: Endoscopy;  Laterality: N/A;   DILATATION &  CURETTAGE/HYSTEROSCOPY WITH MYOSURE N/A 05/06/2016   Procedure: DILATATION & CURETTAGE/HYSTEROSCOPY WITH MYOSURE WITH POLYPECTOMY;  Surgeon: Janyth Pupa, DO;  Location: [facility];  Service: Gynecology;  Laterality: N/A;   ESOPHAGOGASTRODUODENOSCOPY (EGD) WITH PROPOFOL N/A 09/04/2013   Procedure: ESOPHAGOGASTRODUODENOSCOPY (EGD) WITH PROPOFOL;  Surgeon: [name], MD;  [facility];  Service: Endoscopy;  Laterality: N/A;   KNEE ARTHROSCOPY  yrs ago   bilateral, one done x 1, one done twice   POLYPECTOMY  09/05/2018   Procedure: POLYPECTOMY;  Surgeon: [name], MD;  [facility];  Service: Endoscopy;;    Allergies  Allergies  Allergen Reactions   Celecoxib Other (See Comments)    "speech slurred" with celebrex   Shellfish Allergy Swelling and Rash    TINGLING AND SWELLING OF LIPS. LARGE WHELPS QUARTER-SIZE   Barium-Containing Compounds Other (See Comments)    TACHYCARDIA   Prednisone Other (See Comments)    TACHYCARDIA   Statins Other (See Comments)    AGGRAVATED FIBROMYALGIA   Fentanyl     Hallucinations    Ibuprofen Other (See Comments)    UNSPECIFIED SPECIFIC REACTION >> "DUE TO PLATELETS"   Tramadol     Hallucination    Tylenol [Acetaminophen] Other (See Comments)    UNSPECIFIED SPECIFIC REACTION >> "DUE TO PLATELETS"   Zetia [Ezetimibe]     Headache  History of Present Illness    Hannah Khan is a 66 y.o. female with a hx of chronic diastolic heart failure, morbid obesity, OSA, NASH/cirrhosis, chronic thrombocytopenia, DM2, vasovagal presyncope, chronic atrial fibrillation not on anticoagulation last seen 09/16/21.  ED visit 02/2020 with atrial fibrillation requiring diltiazem gtt. At clinic follow up July 2021 noted dyspnea and fluid retention, Bumex increased for short time. At follow up 04/2020 weight and edema were improved.  She saw Dr. Lovena Le 07/2020 and decided upon medical management as she was overall asymptomatic.   Admitted  01/2021 with atrial fibrillation and hepatic encephalopathy. Seen in follow up 02/17/21 doing well. Admitted 03/17/21-03/22/21 with fever and hypoxia treated for community acquired pneumonia. Retired July 1st from [employer] working as a [employer] program. Readmitted 03/2021 with acute on chronic diastolic heart failure suspected due to dietary indiscretion. Wilder Glade was started. Bumex 11m daily continued with Metolazone PRN for weight gain. She did have episode of vasovagal presyncope associated with IV magnesium with CT head and neurology consult unremarkable. Seen 03/2021 weight weight down 21 pounds over previous 6 weeks due to dietary changes. She saw Dr. ROval Linsey9/26/22 - Diltaizem reduced due to hypotension and Metoprolol increased to 730mBID. When seen 09/16/21 BP remained low and Diltiazem reduced to 24047mD and Spironolactone to 12.5mg53m.  Spironolactone subsequently discontinued via phone 2 weeks ago due to hypotension.  Presents today for follow up. Under understandable stress as [place] with Metolazone once or twice per week. Hypotension improved since discontinuation of Spironolactone though still frequently with SBP <100. Planning to start walking in pool at [employer] her home. Exertional dyspnea and lower extremity edema stable. No chest pain, orthopnea, PND. Compliant with CPAP. Hopeful to resolve her hypotension prior to [place].   EKGs/Labs/Other Studies Reviewed:   The following studies were reviewed today:   Echocardiogram 03/20/2021:  1. Technically difficult; normal LV function; enlarged right heart.   2. Left ventricular ejection fraction, by estimation, is 60 to 65%. The  left ventricle has normal function. The left ventricle has no regional  wall motion abnormalities. There is mild left ventricular hypertrophy.  Left ventricular diastolic parameters  are  indeterminate.   3. Right ventricular systolic function is normal. The right ventricular  size is moderately enlarged. There is normal pulmonary artery systolic  pressure.   4. Left atrial size was mildly dilated.   5. Right atrial size was severely dilated.   6. Large pleural effusion.   7. The mitral valve is normal in structure. Trivial mitral valve  regurgitation. No evidence of mitral stenosis.   8. The aortic valve is calcified. Aortic valve regurgitation is not  visualized. No aortic stenosis is present.   9. Aortic dilatation noted. There is borderline dilatation of the  ascending aorta, measuring 38 mm.  10. The inferior vena cava is dilated in size with >50% respiratory  variability, suggesting right atrial pressure of 8 mmHg.  EKG:  No EKG today.   Recent Labs: 01/10/2021: TSH 1.135 03/29/2021: B Natriuretic Peptide 326.1 04/02/2021: Hemoglobin 10.3; Platelets 34 04/09/2021: Magnesium 2.2 09/16/2021: ALT 17; BUN 23; Creatinine, Ser 1.25; Potassium 3.3; Sodium 135  Recent Lipid Panel    Component Value Date/Time   CHOL 175 07/02/2014 1138   TRIG 93 07/02/2014 1138   HDL 59 07/02/2014 1138   CHOLHDL 3.0 07/02/2014 1138  VLDL 19 07/02/2014 1138   [place] 97 07/02/2014 1138    Home Medications   No outpatient medications have been marked as taking for the 11/16/21 encounter (Appointment) with Loel Dubonnet, NP.     Review of Systems      All other systems reviewed and are otherwise negative except as noted above.  Physical Exam    VS:  There were no vitals taken for this visit. , BMI There is no height or weight on file to calculate BMI.  Wt Readings from Last 3 Encounters:  09/16/21 (!) 333 lb 8 oz (151.3 kg)  06/14/21 (!) 327 lb 8 oz (148.6 kg)  04/06/21 (!) 327 lb (148.3 kg)    GEN: Well nourished, overweight, well developed, in no acute distress. HEENT: normal. Neck: Supple, no JVD, carotid bruits, or masses. Cardiac: IRIR, gr 2/6 systolic murmur, no   rubs or gallops. No clubbing, cyanosis, edema.  Radials/PT 2+ and equal bilaterally.  Respiratory:  Respirations regular and unlabored, clear to auscultation bilaterally. GI: Soft, nontender, nondistended. MS: No deformity or atrophy. Skin: Warm and dry, no rash. Neuro:  Strength and sensation are intact. Psych: Normal affect.  Assessment & Plan    Chronic diastolic heart failure - Volume status difficult to ascertain due to body habitus. Weight up in clinic though reports stable weights at home. Low salt diet and <2L fluid restriction encouraged. Continue Jardiance 43m QD, Bumex 211mQD, PRN Metolazone. BMP today to reassess renal function/potassium as Spironolactone discontinued 2 weeks ago due to hypotension. Hopeful to be able to resume in the future. Continue daily weights.   Persistent atrial fibrillation - Previously intolerant to digoxin. No anticoagulation due to high bleed risk and cirrhosis. Due to hypotension, reduce Diltiazem to 18059mD and increase Lopressor to 44m66mD to prevent tachycardia.   OSA - CPAP compliance encouraged.   Thrombocytopenia - Follows with Dr. ShadAlen Blewhematology. Add on CBC with dif to her labs as she shares with me she was due for monitoring but had to cancel her appt with Dr. ShadAlen BlewMorbid obesity - Weight loss via diet and exercise encouraged. Discussed the impact being overweight would have on cardiovascular risk.   DM2 - Continue to follow with PCP. Continue Jardiance for cardioprotective and heart failure benefit.   Liver cirrhosis secondary to nonalcoholic steato hepatitis-  Continua to follow with GI and PCP.   Disposition: Follow up in 2 mos with Dr. RandOval Linseyigned, CaitLoel Dubonnet 11/15/2021, 2:50 PM  [facility]

## 2021-11-16 ENCOUNTER — Other Ambulatory Visit: Payer: Self-pay

## 2021-11-16 ENCOUNTER — Encounter (HOSPITAL_BASED_OUTPATIENT_CLINIC_OR_DEPARTMENT_OTHER): Payer: Self-pay | Admitting: Family

## 2021-11-16 ENCOUNTER — Ambulatory Visit (HOSPITAL_BASED_OUTPATIENT_CLINIC_OR_DEPARTMENT_OTHER): Payer: Medicare PPO | Admitting: Family

## 2021-11-16 VITALS — BP 104/60 | HR 92 | Ht 67.0 in | Wt 341.0 lb

## 2021-11-16 DIAGNOSIS — K7460 Unspecified cirrhosis of liver: Secondary | ICD-10-CM | POA: Diagnosis not present

## 2021-11-16 DIAGNOSIS — I5032 Chronic diastolic (congestive) heart failure: Secondary | ICD-10-CM | POA: Diagnosis not present

## 2021-11-16 DIAGNOSIS — K7581 Nonalcoholic steatohepatitis (NASH): Secondary | ICD-10-CM | POA: Diagnosis not present

## 2021-11-16 DIAGNOSIS — I4819 Other persistent atrial fibrillation: Secondary | ICD-10-CM

## 2021-11-16 MED ORDER — DILTIAZEM HCL ER COATED BEADS 180 MG PO CP24: 180.0000 mg | ORAL_CAPSULE | Freq: Every day | ORAL | 3 refills | Status: DC

## 2021-11-16 MED ORDER — METOPROLOL TARTRATE 50 MG PO TABS: 75.0000 mg | ORAL_TABLET | Freq: Two times a day (BID) | ORAL | 3 refills | Status: DC

## 2021-11-16 NOTE — Patient Instructions (Addendum)
Medication Instructions:  Your physician has recommended you make the following change in your medication:   REDUCE Diltiazem to 185m daily  INCREASE Metoprolol Tartrate to 732m(1.5 tablets) twice daily   *If you need a refill on your cardiac medications before your next appointment, please call your pharmacy*   Lab Work: Your physician recommends that you return for lab work today BMP, CBC with differential   If you have labs (blood work) drawn today and your tests are completely normal, you will receive your results only by: [place]if you have MyChart) OR A paper copy in the mail If you have any lab test that is abnormal or we need to change your treatment, we will call you to review the results.   Testing/Procedures: None ordered today.   Follow-Up: [facility].  As part of our continuing mission to provide you with exceptional heart care, we have created designated Provider Care Teams.  These Care Teams include your primary Cardiologist (physician) and Advanced Practice Providers (APPs -  Physician Assistants and Nurse Practitioners) who all work together to provide you with the care you need, when you need it.  We recommend signing up for the patient portal called "MyChart".  Sign up information is provided on this After Visit Summary.  MyChart is used to connect with patients for Virtual Visits (Telemedicine).  Patients are able to view lab/test results, encounter notes, upcoming appointments, etc.  Non-urgent messages can be sent to your provider as well.   To learn more about what you can do with MyChart, go to [email or web address]   Your next appointment:   In April as scheduled with Dr. RaOval Linsey  Other Instructions Recommend weighing daily and keeping a log. Use as needed Metolazone as directed. Contact our office if using it more than 3 times per week.

## 2021-11-17 ENCOUNTER — Telehealth (HOSPITAL_BASED_OUTPATIENT_CLINIC_OR_DEPARTMENT_OTHER): Payer: Self-pay

## 2021-11-17 LAB — BASIC METABOLIC PANEL
BUN/Creatinine Ratio: 20 (ref 12–28)
BUN: 22 mg/dL (ref 8–27)
CO2: 23 mmol/L (ref 20–29)
Calcium: 9.4 mg/dL (ref 8.7–10.3)
Chloride: 102 mmol/L (ref 96–106)
Creatinine, Ser: 1.11 mg/dL — ABNORMAL HIGH (ref 0.57–1.00)
Glucose: 191 mg/dL — ABNORMAL HIGH (ref 70–99)
Potassium: 3.5 mmol/L (ref 3.5–5.2)
Sodium: 142 mmol/L (ref 134–144)
eGFR: 55 mL/min/{1.73_m2} — ABNORMAL LOW (ref >59)

## 2021-11-17 LAB — CBC WITH DIFFERENTIAL/PLATELET
Basophils Absolute: 0 10*3/uL (ref 0.0–0.2)
Basos: 1 %
EOS (ABSOLUTE): 0.1 10*3/uL (ref 0.0–0.4)
Eos: 2 %
Hematocrit: 33.6 % — ABNORMAL LOW (ref 34.0–46.6)
Hemoglobin: 10.8 g/dL — ABNORMAL LOW (ref 11.1–15.9)
Immature Grans (Abs): 0 10*3/uL (ref 0.0–0.1)
Immature Granulocytes: 0 %
Lymphocytes Absolute: 0.8 10*3/uL (ref 0.7–3.1)
Lymphs: 15 %
MCH: 27.3 pg (ref 26.6–33.0)
MCHC: 32.1 g/dL (ref 31.5–35.7)
MCV: 85 fL (ref 79–97)
Monocytes Absolute: 0.5 10*3/uL (ref 0.1–0.9)
Monocytes: 10 %
Neutrophils Absolute: 3.8 10*3/uL (ref 1.4–7.0)
Neutrophils: 72 %
Platelets: 33 10*3/uL — CL (ref 150–450)
RBC: 3.96 x10E6/uL (ref 3.77–5.28)
RDW: 15.8 % — ABNORMAL HIGH (ref 11.7–15.4)
WBC: 5.3 10*3/uL (ref 3.4–10.8)

## 2021-11-17 NOTE — Telephone Encounter (Addendum)
Results called to patient who verbalizes understanding!  ? ? ? ?----- Message from Loel Dubonnet, NP sent at 11/17/2021  7:43 AM EST ----- ?Stable kidney function. Potassium low normal - continue current dose. Mild anemia and platelet count similar to previous, continue to follow with hematology. ?

## 2022-01-11 NOTE — Progress Notes (Signed)
? ?Cardiology Office Note ? ?Date:  01/12/2022  ? ?ID:  Hannah Khan, DOB Jun 23, 1956, MRN [id number] ? ?PCP:  Lujean Amel, MD  ?Cardiologist:   Skeet Latch, MD  ?Dr. Paulita Fujita: GI ?Dr. Alen Blew: Hematology ? ?No chief complaint on file. ? ? ?History of Present Illness: ? Hannah Khan is a 66 y.o. female with hypertension, chronic atrial fibrillation, diabetes mellitus type 2, hyperlipidemia, cryptogenic cirrhosis, thrombocytopenia, OSA on CPAP, and morbid obesity who presents for follow-up.  Hannah Khan was admitted to the hospital 11/2015 with atrial fibrillation with rapid ventricular response. Given her history of portal hypertension and cirrhosis she was not anticoagulated. She was started on diltiazem for rate control and continued on her home propranolol. Her rate did not stay consistently less than 100 bpm so she was started on digoxin as well.  Echo revealed normal systolic function and her thyroid function was normal.  She was admitted to the hospital 10/7251 with diastolic heart failure.  She was diuresed with IV lasix and digoxin was discontinued due to hypokalemia.  She was seen in follow up 02/2016, at which time she complained of hypotension.  Her heart rate was well-controlled but she continued to feel poorly in AF.  She was referred to Dr. Rayann Heman for consideration of a Watchman device however, he felt that she would not be a good candidate, as she will only be able to be on antiplatelets for one month and she is unlikely to remain in sinus rhythm without significant weight loss.  ? ?Hannah Khan fell and broke her R arm 09/2017.  She was also hospitalized July and August 2019 with cellulitis.  During that hospitalization she had atrial fibrillation with rapid ventricular response.  She was initially treated with a diltiazem drip and then transitioned back to oral diltiazem.  Hannah Khan was admitted 08/2018 with atrial fibrillation with RVR.  Her heart rate was 160 at home and improved to 135 with  her medication.  She was given adenosine and then started on a diltiazem drip.  BNP was 266 and troponin was negative.  It was thought that the episode was triggered by dehydration from vomiting after colonoscopy and possibly viral gatroenteritis.  She had constant diarrhea after the procedure.  She was transitioned back to oral diltiazem.  She had struggled with lower extremity edema.   ? ?Hannah Khan saw [employer] on 03/2020.  Her heart rate was 110 bpm and she was volume overloaded.  Bumex was increased to 4 mg twice daily for 3 days.  She followed up the following month and was doing better with her weight and edema.  She was hypotensive so diltiazem was reduced to 300 mg daily.  Hannah Khan was admitted 01/2021 with hepatic encephalopathy. Metolazone was discontinued. She was then admitted 02/2021 with community acquired pneumonia and volume overload. She was admitted 03/2021 with acute on chronic diastolic heart failure. She followed up with [name], NP later that month and was doing well. Hannah Khan reported maintaining a stable weight and general improvement in her LE edema. Since the dosage of her diltiazem was adjusted in the hospital, she had noticed blood pressures as low as 77/47. She was better managing her volume status since retirement because she was able to take her diuretics. She had melena and an in-hospital colonoscopy was recommended.  ? ?At her last appointment, her BP in clinic was 88/44. This was similar to her readings at home. She reported occasional lightheadedness in the mornings due to  low blood pressures. She planned to increase her exercise at the gym and using a pool. Diltiazem was reduced to 240 mg, and spironolactone was split to 12.5 mg BID. She followed up with [name], NP on 11/16/2021 and her BP was 104/60, frequently <100 at home. Her weight at home was stable, taking prn Metolazone 1-2 times per week. Diltiazem was reduced to 180 mg QD and Lopressor was  increased to 75 mg BID.  ? ?Today, she remains excited about her upcoming trip to [place] in June.  ?About a week ago she injured her right knee. A cortisone injection helped somewhat although her knee is still sore. Lately she is also concerned about fluid retention. Yesterday she took metolazone and bumetanide, but only lost 1 lb. Usually she is taking metolazone about every 3 days. She endorses adequate and frequent urine production on her diuretics. At home, her blood pressure was 94/55 this morning, 150/66 yesterday. Yesterday she suffered from some vertigo while lying down after rolling over. Additionally, she is having difficulty cutting the metoprolol in half as it crumbles in the pill cutter. Sometimes she takes less potassium than prescribed because it is difficult to swallow many of these large pills. ? ?Regarding her diet she typically eats smaller meals, such as chicken with peppers and onions, and a small baked potato. No breading or extra sauce. Of note, her treatment for macular degeneration has resulted in significant improvement in her left eye vision, but she states her right eye will only remain stable. She denies any palpitations, chest pain, or shortness of breath. No headaches, syncope, orthopnea, or PND. She is scheduled to follow up with her diabetes specialist in about 2 weeks. ? ? ?Past Medical History:  ?Diagnosis Date  ? Anemia   ? hx of  ? Arthritis   ? knees  ? Diabetes mellitus without complication ([place])   ? type 2  ? Fibromyalgia   ? HTN (hypertension)   ? Hx of colonic polyps   ? s/p partial colectomy  ? Hyperlipidemia   ? Joint pain   ? in knees and back spasms  ? Left leg swelling   ? wear compression hose  ? Liver cirrhosis secondary to NASH (nonalcoholic steatohepatitis) ([place]) dx nov 2014  ? had enlarged spleen also   ? Low blood pressure   ? bp varies  ? Morbid obesity ([place])   ? Persistent atrial fibrillation ([place])   ? Pneumonia 10/2016  ? PONV (postoperative nausea and  vomiting)   ? in past none recent  ? Portal hypertension ([facility])   ? Sleep apnea   ? uses cpap setting of 14  ? Spleen enlarged   ? 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 [facility])   ? ? ?Past Surgical History:  ?Procedure Laterality Date  ? BREAST LUMPECTOMY WITH RADIOACTIVE SEED LOCALIZATION Right 08/29/2017  ? Procedure: RIGHT BREAST LUMPECTOMY WITH RADIOACTIVE SEEDS LOCALIZATION;  Surgeon: Erroll Luna, MD;  Location: [facility];  Service: General;  Laterality: Right;  ? [place]  ? CHOLECYSTECTOMY  20 yrs ago  ? COLECTOMY  2011  ? COLONOSCOPY    ? COLONOSCOPY N/A 09/05/2018  ? Procedure: COLONOSCOPY;  Surgeon: [name], MD;  [facility];  Service: Endoscopy;  Laterality: N/A;  ? DILATATION & CURETTAGE/HYSTEROSCOPY WITH MYOSURE N/A 05/06/2016  ? Procedure: DILATATION & CURETTAGE/HYSTEROSCOPY WITH MYOSURE WITH POLYPECTOMY;  Surgeon: Janyth Pupa, DO;  Location: [facility];  Service: Gynecology;  Laterality: N/A;  ? ESOPHAGOGASTRODUODENOSCOPY (EGD) WITH PROPOFOL N/A  09/04/2013  ? Procedure: ESOPHAGOGASTRODUODENOSCOPY (EGD) WITH PROPOFOL;  Surgeon: [name], MD;  [facility];  Service: Endoscopy;  Laterality: N/A;  ? KNEE ARTHROSCOPY  yrs ago  ? bilateral, one done x 1, one done twice  ? POLYPECTOMY  09/05/2018  ? Procedure: POLYPECTOMY;  Surgeon: [name], MD;  Location: [facility];  Service: Endoscopy;;  ? ? ? ?Current Outpatient Medications  ?Medication Sig Dispense Refill  ? albuterol (PROVENTIL) (2.5 MG/3ML) 0.083% nebulizer solution Take 3 mLs (2.5 mg total) by nebulization every 6 (six) hours as needed for wheezing or shortness of breath. 24 mL 0  ? bumetanide (BUMEX) 2 MG tablet TAKE 1 TABLET BY MOUTH EVERY DAY 90 tablet 1  ? diltiazem (CARDIZEM CD) 180 MG 24 hr capsule Take 1 capsule (180 mg total) by mouth daily. 90 capsule 3  ? empagliflozin (JARDIANCE) 25 MG TABS tablet Take 25 mg by mouth daily.    ? lactulose (CHRONULAC) 10 GM/15ML solution Take 30 mLs by  mouth 3 (three) times daily.    ? Lancets (ONETOUCH DELICA PLUS [id number]) MISC Apply topically 3 (three) times daily.    ? metolazone (ZAROXOLYN) 5 MG tablet Take 1 table every 3 days  as needed for more than 4 pound

## 2022-01-12 ENCOUNTER — Encounter (HOSPITAL_BASED_OUTPATIENT_CLINIC_OR_DEPARTMENT_OTHER): Payer: Self-pay | Admitting: Cardiovascular Disease

## 2022-01-12 ENCOUNTER — Ambulatory Visit (HOSPITAL_BASED_OUTPATIENT_CLINIC_OR_DEPARTMENT_OTHER): Payer: Medicare PPO | Admitting: Cardiovascular Disease

## 2022-01-12 VITALS — BP 90/50 | HR 93 | Ht 67.0 in | Wt 337.2 lb

## 2022-01-12 DIAGNOSIS — I5043 Acute on chronic combined systolic (congestive) and diastolic (congestive) heart failure: Secondary | ICD-10-CM | POA: Diagnosis not present

## 2022-01-12 DIAGNOSIS — I4819 Other persistent atrial fibrillation: Secondary | ICD-10-CM | POA: Diagnosis not present

## 2022-01-12 DIAGNOSIS — Z5181 Encounter for therapeutic drug level monitoring: Secondary | ICD-10-CM | POA: Diagnosis not present

## 2022-01-12 DIAGNOSIS — E119 Type 2 diabetes mellitus without complications: Secondary | ICD-10-CM

## 2022-01-12 DIAGNOSIS — E785 Hyperlipidemia, unspecified: Secondary | ICD-10-CM

## 2022-01-12 MED ORDER — BUMETANIDE 2 MG PO TABS: 2.0000 mg | ORAL_TABLET | Freq: Two times a day (BID) | ORAL | 3 refills | Status: DC

## 2022-01-12 MED ORDER — METOPROLOL SUCCINATE ER 50 MG PO TB24: 50.0000 mg | ORAL_TABLET | Freq: Every day | ORAL | 3 refills | Status: DC

## 2022-01-12 MED ORDER — POTASSIUM CHLORIDE ER 10 MEQ PO CPCR: ORAL_CAPSULE | ORAL | 11 refills | Status: DC

## 2022-01-12 MED ORDER — METOPROLOL SUCCINATE ER 100 MG PO TB24: ORAL_TABLET | ORAL | 3 refills | Status: DC

## 2022-01-12 NOTE — Assessment & Plan Note (Signed)
Hannah Khan has struggled with weight loss.  I think Ozempic would be helpful.  If she can lose some weight hopefully this will also help her improve her mobility and exercise more.  We will refer her to our pharmacist to get her started.  She sees her endocrinologist this week and will discuss with them as well. ?

## 2022-01-12 NOTE — Assessment & Plan Note (Signed)
Lipids are well controlled. ?

## 2022-01-12 NOTE — Patient Instructions (Addendum)
Medication Instructions:  ?INCREASE YOUR POTASSIUM TO 60 MEQ TWICE A DAY  ? ?INCREASE YOUR BUMEX TO 2 MG TWICE A DAY  ? ?CHANGE YOUR METOPROLOL TO METOPROLOL SUCC 100 MG AND 50 MG 1 OF EACH TABLET DAILY  ? ?*If you need a refill on your cardiac medications before your next appointment, please call your pharmacy* ? ?Lab Work: ?BMET IN 1 WEEK  ? ?If you have labs (blood work) drawn today and your tests are completely normal, you will receive your results only by: ?MyChart Message (if you have MyChart) OR ?A paper copy in the mail ?If you have any lab test that is abnormal or we need to change your treatment, we will call you to review the results. ? ?Testing/Procedures: ?NONE ? ?Follow-Up: ?At [facility], you and your health needs are our priority.  As part of our continuing mission to provide you with exceptional heart care, we have created designated Provider Care Teams.  These Care Teams include your primary Cardiologist (physician) and Advanced Practice Providers (APPs -  Physician Assistants and Nurse Practitioners) who all work together to provide you with the care you need, when you need it. ? ?We recommend signing up for the patient portal called "MyChart".  Sign up information is provided on this After Visit Summary.  MyChart is used to connect with patients for Virtual Visits (Telemedicine).  Patients are able to view lab/test results, encounter notes, upcoming appointments, etc.  Non-urgent messages can be sent to your provider as well.   ?To learn more about what you can do with MyChart, go to [email or web address].   ? ?Your next appointment:   ?6 month(s) ? ?The format for your next appointment:   ?In Person ? ?Provider:   ?Skeet Latch, MD  ? ?[place] NP  ? ?WITH PHARM D SOON TO DISCUSS OZEMPIC  ? ?

## 2022-01-12 NOTE — Assessment & Plan Note (Addendum)
She continues to struggle with volume overload.  Even after taking metolazone she does not get much urinary output anymore.  Renal function has been stable.  We will increase her Bumex to 2 mg twice per day.  Continue taking metolazone twice per week.  Her potassium has been borderline.  We will increase the potassium supplementation to 60 mEq twice daily.  Continue Jardiance.  Blood pressure is too low to further titrate heart failure therapy. ?

## 2022-01-12 NOTE — Assessment & Plan Note (Signed)
Heart rates are well-controlled on her current regimen.  She struggles to cut her metoprolol tablet in half.  We will switch to metoprolol succinate 140m daily.  Continue diltiazem.  She is not on anticoagulation 2/2 bleeding risk from liver disease.  Not a Watchman candidate.   ?

## 2022-01-27 ENCOUNTER — Telehealth: Payer: Self-pay | Admitting: *Deleted

## 2022-01-27 LAB — BASIC METABOLIC PANEL
BUN/Creatinine Ratio: 22 (ref 12–28)
BUN: 22 mg/dL (ref 8–27)
CO2: 27 mmol/L (ref 20–29)
Calcium: 8.9 mg/dL (ref 8.7–10.3)
Chloride: 98 mmol/L (ref 96–106)
Creatinine, Ser: 1.01 mg/dL — ABNORMAL HIGH (ref 0.57–1.00)
Glucose: 177 mg/dL — ABNORMAL HIGH (ref 70–99)
Potassium: 4 mmol/L (ref 3.5–5.2)
Sodium: 137 mmol/L (ref 134–144)
eGFR: 62 mL/min/{1.73_m2} (ref >59)

## 2022-01-27 NOTE — Telephone Encounter (Signed)
? ?  Pre-operative Risk Assessment  ?  ?Patient Name: Hannah Khan  ?DOB: 20-Aug-1956 ?MRN: [id number]  ? ?  ? ?Request for Surgical Clearance   ? ?Procedure:   ENDOSCOPY/COLONOSCOPY ? ?Date of Surgery:  Clearance TBD                              ? ?Surgeon:  DR. Gwyndolyn Saxon OUTLAW ?Surgeon's Group or Practice Name:  [facility] ?Phone number:  [phone] ?Fax number:  [phone] ?  ?Type of Clearance Requested:   ?- Medical  ?  ?Type of Anesthesia:   PROPOFOL ?  ?Additional requests/questions:   ? ?Signed, ?Julaine Hua   ?01/27/2022, 6:02 PM  ? ?

## 2022-01-28 NOTE — Telephone Encounter (Signed)
? ?  Primary Cardiologist: Skeet Latch, MD ? ?Chart reviewed as part of pre-operative protocol coverage. Given past medical history and time since last visit, based on ACC/AHA guidelines, Hannah Khan would be at acceptable risk for the planned procedure without further cardiovascular testing.  ? ?I will route this recommendation to the requesting party via Epic fax function and remove from pre-op pool. ? ?Please call with questions. ? ?Jossie Ng. Sylvio Weatherall NP-C ? ?  ?01/28/2022, 10:36 AM ?[place] ?[address] ?Office [phone] Fax [phone] ? ? ? ? ?

## 2022-02-03 ENCOUNTER — Other Ambulatory Visit (HOSPITAL_BASED_OUTPATIENT_CLINIC_OR_DEPARTMENT_OTHER): Payer: Self-pay

## 2022-02-04 ENCOUNTER — Ambulatory Visit (HOSPITAL_BASED_OUTPATIENT_CLINIC_OR_DEPARTMENT_OTHER): Payer: Medicare PPO

## 2022-03-21 ENCOUNTER — Other Ambulatory Visit: Payer: Self-pay | Admitting: Gastroenterology

## 2022-03-21 DIAGNOSIS — I864 Gastric varices: Secondary | ICD-10-CM | POA: Diagnosis not present

## 2022-03-21 DIAGNOSIS — K7460 Unspecified cirrhosis of liver: Secondary | ICD-10-CM

## 2022-03-21 DIAGNOSIS — Z86010 Personal history of colonic polyps: Secondary | ICD-10-CM | POA: Diagnosis not present

## 2022-03-25 ENCOUNTER — Ambulatory Visit
Admission: RE | Admit: 2022-03-25 | Discharge: 2022-03-25 | Disposition: A | Discharge status: Home / Self Care | Payer: Medicare PPO | Source: Ambulatory Visit | Attending: Gastroenterology | Admitting: Gastroenterology

## 2022-03-25 DIAGNOSIS — K7460 Unspecified cirrhosis of liver: Secondary | ICD-10-CM

## 2022-03-25 DIAGNOSIS — Z139 Encounter for screening, unspecified: Secondary | ICD-10-CM | POA: Diagnosis not present

## 2022-04-08 ENCOUNTER — Other Ambulatory Visit: Payer: Self-pay | Admitting: Gastroenterology

## 2022-04-12 NOTE — Progress Notes (Unsigned)
Office Visit    Patient Name: Hannah Khan Date of Encounter: 04/12/2022  PCP:  Lujean Amel, [place]  Cardiologist:  Skeet Latch, MD  Advanced Practice Provider:  No care team member to display Electrophysiologist:  None   Chief Complaint    Hannah Khan is a 66 y.o. female with a hx of chronic diastolic heart failure, morbid obesity, OSA, NASH/cirrhosis, chronic thrombocytopenia, DM2, vasovagal presyncope, chronic atrial fibrillation not on anticoagulation presents today for heart failure follow up.  Past Medical History    Past Medical History:  Diagnosis Date   Anemia    hx of   Arthritis    knees   Diabetes mellitus without complication (HCC)    type 2   Fibromyalgia    HTN (hypertension)    Hx of colonic polyps    s/p partial colectomy   Hyperlipidemia    Joint pain    in knees and back spasms   Left leg swelling    wear compression hose   Liver cirrhosis secondary to NASH (nonalcoholic steatohepatitis) ([place]) dx nov 2014   had enlarged spleen also    Low blood pressure    bp varies   Morbid obesity ([place])    Persistent atrial fibrillation ([place])    Pneumonia 10/2016   PONV (postoperative nausea and vomiting)    in past none recent   Portal hypertension ([place])    Sleep apnea    uses cpap setting of 14   Spleen enlarged    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 [facility])    Past Surgical History:  Procedure Laterality Date   BREAST LUMPECTOMY WITH RADIOACTIVE SEED LOCALIZATION Right 08/29/2017   Procedure: RIGHT BREAST LUMPECTOMY WITH RADIOACTIVE SEEDS LOCALIZATION;  Surgeon: Erroll Luna, MD;  Location: [facility];  Service: General;  Laterality: Right;   [place]  20 yrs ago   COLECTOMY  2011   COLONOSCOPY     COLONOSCOPY N/A 09/05/2018   Procedure: COLONOSCOPY;  Surgeon: [name], MD;  Location: [facility];  Service: Endoscopy;  Laterality: N/A;   DILATATION &  CURETTAGE/HYSTEROSCOPY WITH MYOSURE N/A 05/06/2016   Procedure: DILATATION & CURETTAGE/HYSTEROSCOPY WITH MYOSURE WITH POLYPECTOMY;  Surgeon: Janyth Pupa, DO;  Location: [facility];  Service: Gynecology;  Laterality: N/A;   ESOPHAGOGASTRODUODENOSCOPY (EGD) WITH PROPOFOL N/A 09/04/2013   Procedure: ESOPHAGOGASTRODUODENOSCOPY (EGD) WITH PROPOFOL;  Surgeon: [name], MD;  Location: [facility];  Service: Endoscopy;  Laterality: N/A;   KNEE ARTHROSCOPY  yrs ago   bilateral, one done x 1, one done twice   POLYPECTOMY  09/05/2018   Procedure: POLYPECTOMY;  Surgeon: [name], MD;  Location: [facility];  Service: Endoscopy;;    Allergies  Allergies  Allergen Reactions   Celecoxib Other (See Comments)    "speech slurred" with celebrex   Shellfish Allergy Swelling and Rash    TINGLING AND SWELLING OF LIPS. LARGE WHELPS QUARTER-SIZE   Barium-Containing Compounds Other (See Comments)    TACHYCARDIA   Prednisone Other (See Comments)    TACHYCARDIA   Statins Other (See Comments)    AGGRAVATED FIBROMYALGIA   Fentanyl     Hallucinations    Ibuprofen Other (See Comments)    UNSPECIFIED SPECIFIC REACTION >> "DUE TO PLATELETS"   Tramadol     Hallucination    Tylenol [Acetaminophen] Other (See Comments)    UNSPECIFIED SPECIFIC REACTION >> "DUE TO PLATELETS"   Zetia [Ezetimibe]     Headache  History of Present Illness    Hannah Khan is a 66 y.o. female with a hx of chronic diastolic heart failure, morbid obesity, OSA, NASH/cirrhosis, chronic thrombocytopenia, DM2, vasovagal presyncope, chronic atrial fibrillation not on anticoagulation last seen 01/12/22  ED visit 02/2020 with atrial fibrillation requiring diltiazem gtt. At clinic follow up July 2021 noted dyspnea and fluid retention, Bumex increased for short time. At follow up 04/2020 weight and edema were improved.  She saw Dr. Lovena Le 07/2020 and decided upon medical management as she was overall asymptomatic.   Admitted  01/2021 with atrial fibrillation and hepatic encephalopathy. Seen in follow up 02/17/21 doing well. Admitted 03/17/21-03/22/21 with fever and hypoxia treated for community acquired pneumonia. Retired July 1st from [place] working as a [employer] program. Readmitted 03/2021 with acute on chronic diastolic heart failure suspected due to dietary indiscretion. Wilder Glade was started. Bumex 69m daily continued with Metolazone PRN for weight gain. She did have episode of vasovagal presyncope associated with IV magnesium with CT head and neurology consult unremarkable. Seen 03/2021 weight weight down 21 pounds over previous 6 weeks due to dietary changes. She saw Dr. ROval Linsey9/26/22 - Diltaizem reduced due to hypotension and Metoprolol increased to 753mBID. When seen 09/16/21 BP remained low and Diltiazem reduced to 24041mD and Spironolactone to 12.5mg101m.  Spironolactone subsequently discontinued via phone 2 weeks ago due to hypotension.At follow up 10/2021 Diltiazem reduced to 180mg63m   She was last seen 01/12/22 with concerns for fluid retention. Taking Metolazone every 3 days. Toprol was transitioned to metoprolol succinate 150mg 5mor easier administration. Bumex increased to 2mg BI3mContinue Metolazone twice per week. Potassium increased to 60mEq B20m.  She pMarland Kitchenesents today for follow up ***  Brother colon cancer sx? Trip to [place]? [place]/Labs/Other Studies Reviewed:   The following studies were reviewed today:   Echocardiogram 03/20/2021:  1. Technically difficult; normal LV function; enlarged right heart.   2. Left ventricular ejection fraction, by estimation, is 60 to 65%. The  left ventricle has normal function. The left ventricle has no regional  wall motion abnormalities. There is mild left ventricular hypertrophy.  Left ventricular diastolic parameters  are indeterminate.   3. Right ventricular systolic function is normal. The right ventricular  size is moderately enlarged. There is  normal pulmonary artery systolic  pressure.   4. Left atrial size was mildly dilated.   5. Right atrial size was severely dilated.   6. Large pleural effusion.   7. The mitral valve is normal in structure. Trivial mitral valve  regurgitation. No evidence of mitral stenosis.   8. The aortic valve is calcified. Aortic valve regurgitation is not  visualized. No aortic stenosis is present.   9. Aortic dilatation noted. There is borderline dilatation of the  ascending aorta, measuring 38 mm.  10. The inferior vena cava is dilated in size with >50% respiratory  variability, suggesting right atrial pressure of 8 mmHg.  EKG:  No EKG today.   Recent Labs: 09/16/2021: ALT 17 11/16/2021: Hemoglobin 10.8; Platelets 33 01/26/2022: BUN 22; Creatinine, Ser 1.01; Potassium 4.0; Sodium 137  Recent Lipid Panel    Component Value Date/Time   CHOL 175 07/02/2014 1138   TRIG 93 07/02/2014 1138   HDL 59 07/02/2014 1138   CHOLHDL 3.0 07/02/2014 1138   VLDL 19 07/02/2014 1138   LDLCALC 97 07/02/2014 1138    Home Medications   No outpatient medications have been marked as taking  for the 04/13/22 encounter (Appointment) with Loel Dubonnet, NP.     Review of Systems      All other systems reviewed and are otherwise negative except as noted above.  Physical Exam    VS:  There were no vitals taken for this visit. , BMI There is no height or weight on file to calculate BMI.  Wt Readings from Last 3 Encounters:  01/12/22 (!) 337 lb 3.2 oz (153 kg)  11/16/21 (!) 341 lb (154.7 kg)  09/16/21 (!) 333 lb 8 oz (151.3 kg)    GEN: Well nourished, overweight, well developed, in no acute distress. HEENT: normal. Neck: Supple, no JVD, carotid bruits, or masses. Cardiac: IRIR, gr 2/6 systolic murmur, no  rubs or gallops. No clubbing, cyanosis, edema.  Radials/PT 2+ and equal bilaterally.  Respiratory:  Respirations regular and unlabored, clear to auscultation bilaterally. GI: Soft, nontender,  nondistended. MS: No deformity or atrophy. Skin: Warm and dry, no rash. Neuro:  Strength and sensation are intact. Psych: Normal affect.  Assessment & Plan    Chronic diastolic heart failure - Volume status difficult to ascertain due to body habitus. ***Continue Jardiance 23m QD, Bumex 259mQD, PRN Metolazone. ***  Persistent atrial fibrillation - Previously intolerant to digoxin. No anticoagulation due to high bleed risk and cirrhosis.***  OSA - CPAP compliance encouraged.   Thrombocytopenia - Follows with Dr. ShAlen Blewf hematology.   Morbid obesity - Weight loss via diet and exercise encouraged. Discussed the impact being overweight would have on cardiovascular risk. ***  DM2 - Continue to follow with PCP. Continue Jardiance for cardioprotective and heart failure benefit.   Liver cirrhosis secondary to nonalcoholic steato hepatitis-  Continua to follow with GI and PCP.   Disposition: Follow up *** with Dr. RaOval Linsey Signed, CaLoel DubonnetNP 04/12/2022, 9:[place]

## 2022-04-13 ENCOUNTER — Encounter (HOSPITAL_BASED_OUTPATIENT_CLINIC_OR_DEPARTMENT_OTHER): Payer: Self-pay | Admitting: Family

## 2022-04-13 ENCOUNTER — Ambulatory Visit (HOSPITAL_BASED_OUTPATIENT_CLINIC_OR_DEPARTMENT_OTHER): Payer: Medicare PPO | Admitting: Family

## 2022-04-13 VITALS — BP 93/59 | HR 98 | Ht 67.0 in | Wt 324.6 lb

## 2022-04-13 DIAGNOSIS — K7460 Unspecified cirrhosis of liver: Secondary | ICD-10-CM

## 2022-04-13 DIAGNOSIS — G4733 Obstructive sleep apnea (adult) (pediatric): Secondary | ICD-10-CM

## 2022-04-13 DIAGNOSIS — I4821 Permanent atrial fibrillation: Secondary | ICD-10-CM | POA: Diagnosis not present

## 2022-04-13 DIAGNOSIS — K7581 Nonalcoholic steatohepatitis (NASH): Secondary | ICD-10-CM | POA: Diagnosis not present

## 2022-04-13 DIAGNOSIS — I5032 Chronic diastolic (congestive) heart failure: Secondary | ICD-10-CM

## 2022-04-13 NOTE — Patient Instructions (Signed)
Medication Instructions:  Continue your current medications.   *If you need a refill on your cardiac medications before your next appointment, please call your pharmacy*  Lab Work: None ordered today.   Testing/Procedures: Your EKG today showed rate controlled atrial fibrillation. This is a stable finding.   Follow-Up: At [facility], you and your health needs are our priority.  As part of our continuing mission to provide you with exceptional heart care, we have created designated Provider Care Teams.  These Care Teams include your primary Cardiologist (physician) and Advanced Practice Providers (APPs -  Physician Assistants and Nurse Practitioners) who all work together to provide you with the care you need, when you need it.  We recommend signing up for the patient portal called "MyChart".  Sign up information is provided on this After Visit Summary.  MyChart is used to connect with patients for Virtual Visits (Telemedicine).  Patients are able to view lab/test results, encounter notes, upcoming appointments, etc.  Non-urgent messages can be sent to your provider as well.   To learn more about what you can do with MyChart, go to [email or web address].    Your next appointment:   3-4 month(s)  The format for your next appointment:   In Person  Provider:   Skeet Latch, MD    Other Instructions  Heart Healthy Diet Recommendations: A low-salt diet is recommended. Meats should be grilled, baked, or boiled. Avoid fried foods. Focus on lean protein sources like fish or chicken with vegetables and fruits. The American Heart Association is a Microbiologist!  American Heart Association Diet and Lifeystyle Recommendations   Exercise recommendations: The American Heart Association recommends 150 minutes of moderate intensity exercise weekly. Try 30 minutes of moderate intensity exercise 4-5 times per week. This could include walking, jogging, or swimming. If you want to  participate in PREP program at the [facility], simply call and we will place a referral.   Important Information About Sugar

## 2022-04-27 ENCOUNTER — Encounter (HOSPITAL_COMMUNITY): Payer: Self-pay | Admitting: Gastroenterology

## 2022-04-27 NOTE — Progress Notes (Signed)
Attempted to obtain medical history via telephone, unable to reach at this time. HIPAA compliant voicemail message left requesting return call to pre surgical testing department. 

## 2022-04-28 ENCOUNTER — Encounter: Payer: Self-pay | Admitting: Oncology

## 2022-05-03 ENCOUNTER — Other Ambulatory Visit: Payer: Self-pay | Admitting: Gastroenterology

## 2022-05-04 ENCOUNTER — Ambulatory Visit (HOSPITAL_BASED_OUTPATIENT_CLINIC_OR_DEPARTMENT_OTHER): Payer: Medicare PPO | Admitting: Anesthesiology

## 2022-05-04 ENCOUNTER — Ambulatory Visit (HOSPITAL_COMMUNITY): Payer: Medicare PPO | Admitting: Anesthesiology

## 2022-05-04 ENCOUNTER — Encounter (HOSPITAL_COMMUNITY)
Admission: RE | Disposition: A | Discharge status: Home / Self Care | Payer: Self-pay | Source: Home / Self Care | Attending: Gastroenterology

## 2022-05-04 ENCOUNTER — Other Ambulatory Visit: Payer: Self-pay

## 2022-05-04 ENCOUNTER — Ambulatory Visit (HOSPITAL_COMMUNITY)
Admission: RE | Admit: 2022-05-04 | Discharge: 2022-05-04 | Disposition: A | Discharge status: Home / Self Care | Payer: Medicare PPO | Attending: Gastroenterology | Admitting: Gastroenterology

## 2022-05-04 ENCOUNTER — Encounter (HOSPITAL_COMMUNITY): Payer: Self-pay | Admitting: Gastroenterology

## 2022-05-04 DIAGNOSIS — E785 Hyperlipidemia, unspecified: Secondary | ICD-10-CM | POA: Diagnosis not present

## 2022-05-04 DIAGNOSIS — I4891 Unspecified atrial fibrillation: Secondary | ICD-10-CM | POA: Insufficient documentation

## 2022-05-04 DIAGNOSIS — G4733 Obstructive sleep apnea (adult) (pediatric): Secondary | ICD-10-CM

## 2022-05-04 DIAGNOSIS — Z87891 Personal history of nicotine dependence: Secondary | ICD-10-CM | POA: Insufficient documentation

## 2022-05-04 DIAGNOSIS — I8500 Esophageal varices without bleeding: Secondary | ICD-10-CM | POA: Diagnosis not present

## 2022-05-04 DIAGNOSIS — Z86010 Personal history of colonic polyps: Secondary | ICD-10-CM

## 2022-05-04 DIAGNOSIS — M1990 Unspecified osteoarthritis, unspecified site: Secondary | ICD-10-CM | POA: Diagnosis not present

## 2022-05-04 DIAGNOSIS — K766 Portal hypertension: Secondary | ICD-10-CM | POA: Insufficient documentation

## 2022-05-04 DIAGNOSIS — K7581 Nonalcoholic steatohepatitis (NASH): Secondary | ICD-10-CM | POA: Insufficient documentation

## 2022-05-04 DIAGNOSIS — Z79899 Other long term (current) drug therapy: Secondary | ICD-10-CM | POA: Insufficient documentation

## 2022-05-04 DIAGNOSIS — Z1211 Encounter for screening for malignant neoplasm of colon: Secondary | ICD-10-CM | POA: Insufficient documentation

## 2022-05-04 DIAGNOSIS — K3189 Other diseases of stomach and duodenum: Secondary | ICD-10-CM | POA: Insufficient documentation

## 2022-05-04 DIAGNOSIS — I509 Heart failure, unspecified: Secondary | ICD-10-CM | POA: Insufficient documentation

## 2022-05-04 DIAGNOSIS — K5730 Diverticulosis of large intestine without perforation or abscess without bleeding: Secondary | ICD-10-CM | POA: Diagnosis not present

## 2022-05-04 DIAGNOSIS — M797 Fibromyalgia: Secondary | ICD-10-CM | POA: Diagnosis not present

## 2022-05-04 DIAGNOSIS — I8510 Secondary esophageal varices without bleeding: Secondary | ICD-10-CM | POA: Diagnosis not present

## 2022-05-04 DIAGNOSIS — G4730 Sleep apnea, unspecified: Secondary | ICD-10-CM | POA: Insufficient documentation

## 2022-05-04 DIAGNOSIS — Z9989 Dependence on other enabling machines and devices: Secondary | ICD-10-CM | POA: Diagnosis not present

## 2022-05-04 DIAGNOSIS — K7460 Unspecified cirrhosis of liver: Secondary | ICD-10-CM | POA: Diagnosis not present

## 2022-05-04 DIAGNOSIS — E119 Type 2 diabetes mellitus without complications: Secondary | ICD-10-CM | POA: Diagnosis not present

## 2022-05-04 DIAGNOSIS — Z9049 Acquired absence of other specified parts of digestive tract: Secondary | ICD-10-CM | POA: Diagnosis not present

## 2022-05-04 DIAGNOSIS — K5790 Diverticulosis of intestine, part unspecified, without perforation or abscess without bleeding: Secondary | ICD-10-CM | POA: Diagnosis not present

## 2022-05-04 DIAGNOSIS — D61818 Other pancytopenia: Secondary | ICD-10-CM

## 2022-05-04 DIAGNOSIS — Z6841 Body Mass Index (BMI) 40.0 and over, adult: Secondary | ICD-10-CM | POA: Insufficient documentation

## 2022-05-04 DIAGNOSIS — Z7984 Long term (current) use of oral hypoglycemic drugs: Secondary | ICD-10-CM | POA: Insufficient documentation

## 2022-05-04 DIAGNOSIS — I110 Hypertensive heart disease with heart failure: Secondary | ICD-10-CM | POA: Insufficient documentation

## 2022-05-04 DIAGNOSIS — I864 Gastric varices: Secondary | ICD-10-CM

## 2022-05-04 DIAGNOSIS — Z09 Encounter for follow-up examination after completed treatment for conditions other than malignant neoplasm: Secondary | ICD-10-CM | POA: Diagnosis not present

## 2022-05-04 HISTORY — PX: ESOPHAGOGASTRODUODENOSCOPY (EGD) WITH PROPOFOL: SHX5813

## 2022-05-04 HISTORY — PX: COLONOSCOPY WITH PROPOFOL: SHX5780

## 2022-05-04 LAB — GLUCOSE, CAPILLARY: Glucose-Capillary: 96 mg/dL (ref 70–99)

## 2022-05-04 SURGERY — ESOPHAGOGASTRODUODENOSCOPY (EGD) WITH PROPOFOL
Anesthesia: Monitor Anesthesia Care | Laterality: Bilateral

## 2022-05-04 MED ORDER — SODIUM CHLORIDE 0.9 % IV SOLN: INTRAVENOUS | Status: DC

## 2022-05-04 MED ORDER — PROPOFOL 500 MG/50ML IV EMUL
INTRAVENOUS | Status: DC | PRN
  Administered 2022-05-04: 100 ug/kg/min via INTRAVENOUS

## 2022-05-04 MED ORDER — PROPOFOL 1000 MG/100ML IV EMUL
INTRAVENOUS | Status: AC
  Filled 2022-05-04: qty 100

## 2022-05-04 MED ORDER — LACTATED RINGERS IV SOLN: INTRAVENOUS | Status: DC | PRN

## 2022-05-04 MED ORDER — PROPOFOL 10 MG/ML IV BOLUS
INTRAVENOUS | Status: DC | PRN
  Administered 2022-05-04: 50 mg via INTRAVENOUS

## 2022-05-04 SURGICAL SUPPLY — 25 items

## 2022-05-04 NOTE — Transfer of Care (Signed)
Immediate Anesthesia Transfer of Care Note  Patient: DANAYSHA KIRN  Procedure(s) Performed: ESOPHAGOGASTRODUODENOSCOPY (EGD) WITH PROPOFOL (Bilateral) COLONOSCOPY WITH PROPOFOL (Bilateral)  Patient Location: PACU  Anesthesia Type:MAC  Level of Consciousness: awake, alert , oriented and patient cooperative  Airway & Oxygen Therapy: Patient Spontanous Breathing and Patient connected to face mask oxygen  Post-op Assessment: Report given to RN, Post -op Vital signs reviewed and stable and Patient moving all extremities X 4  Post vital signs: Reviewed and stable  Last Vitals:  Vitals Value Taken Time  BP 98/51 05/04/22 1044  Temp 36.7 C 05/04/22 1044  Pulse 103 05/04/22 1047  Resp 18 05/04/22 1047  SpO2 94 % 05/04/22 1047  Vitals shown include unvalidated device data.  Last Pain:  Vitals:   05/04/22 1044  TempSrc: Temporal  PainSc: 0-No pain         Complications: No notable events documented.

## 2022-05-04 NOTE — Progress Notes (Signed)
UNMATCHED BLOOD PRODUCT NOTE  Compare the patient ID on the blood tag to the patient ID on the hospital armband and Blood Bank armband. Then confirm the unit number on the blood tag matches the unit number on the blood product.  If a discrepancy is discovered return the product to blood bank immediately.   Blood Product Type: Platelets (Pheresed)  Unit #: (Found on blood product bag, begins with W)   Product Code #: (Found on blood product bag, begins with E)    Start Time:   Starting Rate:  ml/hr  Rate increase/decreased  (if applicable):  969    ml/hr  Rate changed time (if applicable): 4098   Stop Time:    All Other Documentation should be documented within the Blood Admin Flowsheet per policy.

## 2022-05-04 NOTE — Discharge Instructions (Signed)
Endoscopy Care After Please read the instructions outlined below and refer to this sheet in the next few weeks. These discharge instructions provide you with general information on caring for yourself after you leave the hospital. Your doctor may also give you specific instructions. While your treatment has been planned according to the most current medical practices available, unavoidable complications occasionally occur. If you have any problems or questions after discharge, please call Dr. Paulita Fujita [facility]) at [phone].  HOME CARE INSTRUCTIONS Activity You may resume your regular activity but move at a slower pace for the next 24 hours.  Take frequent rest periods for the next 24 hours.  Walking will help expel (get rid of) the air and reduce the bloated feeling in your abdomen.  No driving for 24 hours (because of the anesthesia (medicine) used during the test).  You may shower.  Do not sign any important legal documents or operate any machinery for 24 hours (because of the anesthesia used during the test).  Nutrition Drink plenty of fluids.  You may resume your normal diet.  Begin with a light meal and progress to your normal diet.  Avoid alcoholic beverages for 24 hours or as instructed by your caregiver.  Medications You may resume your normal medications unless your caregiver tells you otherwise. What you can expect today You may experience abdominal discomfort such as a feeling of fullness or "gas" pains.  You may experience a sore throat for 2 to 3 days. This is normal. Gargling with salt water may help this.   SEEK IMMEDIATE MEDICAL CARE IF: You have excessive nausea (feeling sick to your stomach) and/or vomiting.  You have severe abdominal pain and distention (swelling).  You have trouble swallowing.  You have a temperature over 100 F (37.8 C).  You have rectal bleeding or vomiting of blood.  Document Released: 04/19/2004 Document Revised: 05/18/2011  Document Reviewed: 10/31/2007 [facility] Patient Information 2012 [place].   Colonoscopy  Post procedure instructions:  Read the instructions outlined below and refer to this sheet in the next few weeks. These discharge instructions provide you with general information on caring for yourself after you leave the hospital. Your doctor may also give you specific instructions. While your treatment has been planned according to the most current medical practices available, unavoidable complications occasionally occur. If you have any problems or questions after discharge, call Dr. Paulita Fujita at [facility] [phone]).  HOME CARE INSTRUCTIONS  ACTIVITY: You may resume your regular activity, but move at a slower pace for the next 24 hours.  Take frequent rest periods for the next 24 hours.  Walking will help get rid of the air and reduce the bloated feeling in your belly (abdomen).  No driving for 24 hours (because of the medicine (anesthesia) used during the test).  You may shower.  Do not sign any important legal documents or operate any machinery for 24 hours (because of the anesthesia used during the test).  NUTRITION: Drink plenty of fluids.  You may resume your normal diet as instructed by your doctor.  Begin with a light meal and progress to your normal diet. Heavy or fried foods are harder to digest and may make you feel sick to your stomach (nauseated).  Avoid alcoholic beverages for 24 hours or as instructed.  MEDICATIONS: You may resume your normal medications unless your doctor tells you otherwise.  WHAT TO EXPECT TODAY: Some feelings of bloating in the abdomen.  Passage of more gas than usual.  Spotting  of blood in your stool or on the toilet paper.  IF YOU HAD POLYPS REMOVED DURING THE COLONOSCOPY: No aspirin products for 7 days or as instructed.  No alcohol for 7 days or as instructed.  Eat a soft diet for the next 24 hours.   FINDING OUT THE RESULTS OF YOUR  TEST  Not all test results are available during your visit. If your test results are not back during the visit, make an appointment with your caregiver to find out the results. Do not assume everything is normal if you have not heard from your caregiver or the medical facility. It is important for you to follow up on all of your test results.     SEEK IMMEDIATE MEDICAL CARE IF:  You have more than a spotting of blood in your stool.  Your belly is swollen (abdominal distention).  You are nauseated or vomiting.  You have a fever.  You have abdominal pain or discomfort that is severe or gets worse throughout the day.    Document Released: 04/19/2004 Document Revised: 05/18/2011 Document Reviewed: 04/17/2008 [facility] Patient Information 2012 [place].

## 2022-05-04 NOTE — Op Note (Signed)
[facility] Patient Name: Hannah Khan Procedure Date: 05/04/2022 MRN: [id number] Attending MD: [name] , MD Date of Birth: 07/27/1956 CSN: [id number] Age: 66 Admit Type: Outpatient Procedure:                Colonoscopy Indications:              High risk colon cancer surveillance: Personal                            history of colonic polyps, Last colonoscopy: 2019 Providers:                [name], MD, Kary Kos RN, RN, Darliss Cheney, Technician Referring MD:             Dr. Dorthy Cooler Medicines:                Monitored Anesthesia Care, 2 units pheresed                            platelets before/during procedure Complications:            No immediate complications. Estimated Blood Loss:      Procedure:                Pre-Anesthesia Assessment:                           - Prior to the procedure, a History and Physical                            was performed, and patient medications and                            allergies were reviewed. [name] tolerance of                            previous anesthesia was also reviewed. The risks                            and benefits of the procedure and the sedation                            options and risks were discussed with the patient.                            All questions were answered, and informed consent                            was obtained. Prior Anticoagulants: The patient has                            taken no previous anticoagulant or antiplatelet                            agents. ASA Grade  Assessment: IV - A patient with                            severe systemic disease that is a constant threat                            to life. After reviewing the risks and benefits,                            the patient was deemed in satisfactory condition to                            undergo the procedure.                           - Prior to the procedure, a History  and Physical                            was performed, and patient medications and                            allergies were reviewed. [name] tolerance of                            previous anesthesia was also reviewed. The risks                            and benefits of the procedure and the sedation                            options and risks were discussed with the patient.                            All questions were answered, and informed consent                            was obtained. Prior Anticoagulants: The patient has                            taken no previous anticoagulant or antiplatelet                            agents. ASA Grade Assessment: IV - A patient with                            severe systemic disease that is a constant threat                            to life. After reviewing the risks and benefits,                            the patient was deemed in satisfactory condition to  undergo the procedure.                           After obtaining informed consent, the colonoscope                            was passed under direct vision. Throughout the                            procedure, the patient's blood pressure, pulse, and                            oxygen saturations were monitored continuously. The                            [id number]-HQ190L ([id number] Olympus colonoscope was                            introduced through the anus and advanced to the the                            ileocolonic anastomosis. The colonoscopy was                            performed without difficulty. The patient tolerated                            the procedure well. The quality of the bowel                            preparation was adequate. Scope In: 10:19:18 AM Scope Out: 10:35:14 AM Scope Withdrawal Time: 0 hours 11 minutes 33 seconds  Total Procedure Duration: 0 hours 15 minutes 56 seconds  Findings:      The perianal and digital rectal  examinations were normal.      A few medium-mouthed diverticula were found in the sigmoid colon and       descending colon.      Colon otherwise normal; no other polyps, masses, vascular ectasias, or       inflammatory changes were seen. Normal-appearing ileocolic anastomosis       from prior right hemicolectomy.      The retroflexed view of the distal rectum and anal verge was normal and       showed no anal or rectal abnormalities. Impression:               - Diverticulosis in the sigmoid colon and in the                            descending colon.                           - The distal rectum and anal verge are normal on                            retroflexion view.                           -  No specimens collected.                           - The examination was otherwise normal. Moderate Sedation:      None Recommendation:           - Patient has a contact number available for                            emergencies. The signs and symptoms of potential                            delayed complications were discussed with the                            patient. Return to normal activities tomorrow.                            Written discharge instructions were provided to the                            patient.                           - Discharge patient to home (via wheelchair).                           - Resume previous diet today.                           - Continue present medications.                           - Repeat colonoscopy in 3-5 years for surveillance,                            pending patient's state-of-health.                           - Return to GI office in 3 months.                           - Return to referring physician as previously                            scheduled. Procedure Code(s):        --- Professional ---                           [phone], Colonoscopy, flexible; diagnostic, including                            collection of specimen(s) by  brushing or washing,                            when performed (separate procedure) Diagnosis Code(s):        --- Professional ---  Z86.010, Personal history of colonic polyps                           K57.30, Diverticulosis of large intestine without                            perforation or abscess without bleeding CPT copyright 2019 American [employer] Association. All rights reserved. The codes documented in this report are preliminary and upon coder review may  be revised to meet current compliance requirements. [name], MD 05/04/2022 10:56:12 AM This report has been signed electronically. Number of Addenda: 0

## 2022-05-04 NOTE — Op Note (Signed)
[facility] Patient Name: Hannah Khan Procedure Date: 05/04/2022 MRN: [id number] Attending MD: [name] , MD Date of Birth: Feb 10, 1956 CSN: [id number] Age: 66 Admit Type: Outpatient Procedure:                Upper GI endoscopy Indications:              Follow-up of esophageal varices, cirrhosis Providers:                [name], MD, Kary Kos RN, RN, Darliss Cheney, Technician Referring MD:             Dr. Dorthy Cooler Medicines:                Monitored Anesthesia Care, 2 Units pheresed                            platelets pre/during procedure Complications:            No immediate complications. Estimated Blood Loss:     Estimated blood loss: none. Procedure:                Pre-Anesthesia Assessment:                           - Prior to the procedure, a History and Physical                            was performed, and patient medications and                            allergies were reviewed. [name] tolerance of                            previous anesthesia was also reviewed. The risks                            and benefits of the procedure and the sedation                            options and risks were discussed with the patient.                            All questions were answered, and informed consent                            was obtained. Prior Anticoagulants: The patient has                            taken no previous anticoagulant or antiplatelet                            agents. ASA Grade Assessment: IV - A patient with  severe systemic disease that is a constant threat                            to life. After reviewing the risks and benefits,                            the patient was deemed in satisfactory condition to                            undergo the procedure.                           After obtaining informed consent, the endoscope was                             passed under direct vision. Throughout the                            procedure, the patient's blood pressure, pulse, and                            oxygen saturations were monitored continuously. The                            [id number]-[id number] ([id number] Olympus endoscope was introduced                            through the mouth, and advanced to the second part                            of duodenum. The upper GI endoscopy was                            accomplished without difficulty. The patient                            tolerated the procedure well. Scope In: Scope Out: Findings:      Grade I varices were found in the lower third of the esophagus. They       were small in size.      The exam of the esophagus was otherwise normal.      Moderate portal hypertensive gastropathy was found in the gastric fundus       and in the gastric body.      Type 1 isolated gastric varices (IGV1, varices located in the fundus)       with no bleeding were found in the cardia. There were no stigmata of       recent bleeding. They were medium in largest diameter.      The exam of the stomach was otherwise normal.      A single medium submucosal nodule with a localized distribution was       found in the second portion of the duodenum.      The exam of the duodenum was otherwise normal. Impression:               -  Grade I esophageal varices.                           - Portal hypertensive gastropathy.                           - Type 1 isolated gastric varices (IGV1, varices                            located in the fundus), without bleeding.                           - Submucosal nodule found in the duodenum.                           - No specimens collected. Moderate Sedation:      None Recommendation:           - Discuss with cardiology whether patient would be                            candidate to switch from metoprolol to carvedilol.                            Would not advise banding/shunting  procedures as                            preventative measure at this time, especially given                            patient's other comorbidities.                           - Perform a colonoscopy today. Procedure Code(s):        --- Professional ---                           [phone], Esophagogastroduodenoscopy, flexible,                            transoral; diagnostic, including collection of                            specimen(s) by brushing or washing, when performed                            (separate procedure) Diagnosis Code(s):        --- Professional ---                           I85.00, Esophageal varices without bleeding                           K76.6, Portal hypertension                           K31.89, Other diseases of stomach and duodenum  I86.4, Gastric varices CPT copyright 2019 American Medical Association. All rights reserved. The codes documented in this report are preliminary and upon coder review may  be revised to meet current compliance requirements. [name], MD 05/04/2022 10:53:02 AM This report has been signed electronically. Number of Addenda: 0

## 2022-05-04 NOTE — Progress Notes (Signed)
UNMATCHED BLOOD PRODUCT NOTE  Compare the patient ID on the blood tag to the patient ID on the hospital armband and Blood Bank armband. Then confirm the unit number on the blood tag matches the unit number on the blood product.  If a discrepancy is discovered return the product to blood bank immediately.   Blood Product Type: Platelets (Pheresed)  Unit #: (Found on blood product bag, begins with W) [id number]  Product Code #: (Found on blood product bag, begins with E) [id number]   Start Time: 0729  Starting Rate: 120 ml/hr  Rate increase/decreased  (if applicable):      ml/hr  Rate changed time (if applicable):    Stop Time:    All Other Documentation should be documented within the Blood Admin Flowsheet per policy.

## 2022-05-04 NOTE — Progress Notes (Signed)
UNMATCHED BLOOD PRODUCT NOTE  Compare the patient ID on the blood tag to the patient ID on the hospital armband and Blood Bank armband. Then confirm the unit number on the blood tag matches the unit number on the blood product.  If a discrepancy is discovered return the product to blood bank immediately.   Blood Product Type: Platelets (Pheresed)  Unit #: (Found on blood product bag, begins with W)   Product Code #: (Found on blood product bag, begins with E)    Start Time:   Starting Rate:  ml/hr  Rate increase/decreased  (if applicable): 097     ml/hr  Rate changed time (if applicable): 9499   Stop Time:    All Other Documentation should be documented within the Blood Admin Flowsheet per policy.

## 2022-05-04 NOTE — Anesthesia Preprocedure Evaluation (Signed)
Anesthesia Evaluation  Patient identified by MRN, date of birth, ID band Patient awake    Reviewed: Allergy & Precautions, NPO status , Patient's Chart, lab work & pertinent test results, reviewed documented beta blocker date and time   History of Anesthesia Complications (+) PONV and history of anesthetic complications  Airway Mallampati: I  TM Distance: >3 FB Neck ROM: Full    Dental no notable dental hx.    Pulmonary sleep apnea and Continuous Positive Airway Pressure Ventilation , former smoker,    Pulmonary exam normal breath sounds clear to auscultation       Cardiovascular hypertension, Pt. on home beta blockers +CHF  Normal cardiovascular exam+ dysrhythmias Atrial Fibrillation  Rhythm:Regular Rate:Normal  ECG: Rate 85, A-fib, incomplete RBBB  ECHO: LV EF: 55% -   60%   Neuro/Psych negative neurological ROS  negative psych ROS   GI/Hepatic negative GI ROS, (+) Cirrhosis       , Hepatitis -Liver cirrhosis secondary to NASH (nonalcoholic steatohepatitis) Portal hypertension    Endo/Other  diabetes, Oral Hypoglycemic AgentsMorbid obesity (Super)  Renal/GU negative Renal ROS     Musculoskeletal  (+) Arthritis , Osteoarthritis,  Fibromyalgia -  Abdominal (+) + obese,   Peds  Hematology HLD Thrombocytopenia    Anesthesia Other Findings History of colon polyps  Reproductive/Obstetrics                             Anesthesia Physical  Anesthesia Plan  ASA: IV  Anesthesia Plan: MAC   Post-op Pain Management: Minimal or no pain anticipated   Induction: Intravenous  PONV Risk Score and Plan: 3 and Propofol infusion and Treatment may vary due to age or medical condition  Airway Management Planned: Nasal Cannula  Additional Equipment:   Intra-op Plan:   Post-operative Plan:   Informed Consent: I have reviewed the patients History and Physical, chart, labs and discussed the  procedure including the risks, benefits and alternatives for the proposed anesthesia with the patient or authorized representative who has indicated his/her understanding and acceptance.     Dental advisory given  Plan Discussed with: CRNA  Anesthesia Plan Comments:         Anesthesia Quick Evaluation

## 2022-05-04 NOTE — H&P (Signed)
[facility] H/P Note  Chief Complaint: cirrhosis, personal history of colon polyps  HPI: Hannah Khan is an 66 y.o. female.  History cirrhosis, personal history polyps.  No abdominal pain, blood in stool.  Past Medical History:  Diagnosis Date   Anemia    hx of   Arthritis    knees   Diabetes mellitus without complication (HCC)    type 2   Fibromyalgia    HTN (hypertension)    Hx of colonic polyps    s/p partial colectomy   Hyperlipidemia    Joint pain    in knees and back spasms   Left leg swelling    wear compression hose   Liver cirrhosis secondary to NASH (nonalcoholic steatohepatitis) ([place]) dx nov 2014   had enlarged spleen also    Low blood pressure    bp varies   Morbid obesity ([place])    Persistent atrial fibrillation ([place])    Pneumonia 10/2016   PONV (postoperative nausea and vomiting)    in past none recent   Portal hypertension ([place])    Sleep apnea    uses cpap setting of 14   Spleen enlarged    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 [facility])     Past Surgical History:  Procedure Laterality Date   BREAST LUMPECTOMY WITH RADIOACTIVE SEED LOCALIZATION Right 08/29/2017   Procedure: RIGHT BREAST LUMPECTOMY WITH RADIOACTIVE SEEDS LOCALIZATION;  Surgeon: Erroll Luna, MD;  Location: [facility];  Service: General;  Laterality: Right;   [place]  20 yrs ago   COLECTOMY  2011   COLONOSCOPY     COLONOSCOPY N/A 09/05/2018   Procedure: COLONOSCOPY;  Surgeon: [name], MD;  Location: [facility];  Service: Endoscopy;  Laterality: N/A;   DILATATION & CURETTAGE/HYSTEROSCOPY WITH MYOSURE N/A 05/06/2016   Procedure: DILATATION & CURETTAGE/HYSTEROSCOPY WITH MYOSURE WITH POLYPECTOMY;  Surgeon: Janyth Pupa, DO;  Location: [facility];  Service: Gynecology;  Laterality: N/A;   ESOPHAGOGASTRODUODENOSCOPY (EGD) WITH PROPOFOL N/A 09/04/2013   Procedure: ESOPHAGOGASTRODUODENOSCOPY (EGD) WITH PROPOFOL;  Surgeon: [name], MD;   Location: [facility];  Service: Endoscopy;  Laterality: N/A;   KNEE ARTHROSCOPY  yrs ago   bilateral, one done x 1, one done twice   POLYPECTOMY  09/05/2018   Procedure: POLYPECTOMY;  Surgeon: [name], MD;  Location: [facility];  Service: Endoscopy;;    Medications Prior to Admission  Medication Sig Dispense Refill   Bacitracin-Polymyxin B (NEOSPORIN EX) Apply 1 Application topically daily as needed (wound Treatment).     bumetanide (BUMEX) 2 MG tablet Take 1 tablet (2 mg total) by mouth 2 (two) times daily. (Patient taking differently: Take 2 mg by mouth daily.) 180 tablet 3   diltiazem (CARDIZEM CD) 180 MG 24 hr capsule Take 1 capsule (180 mg total) by mouth daily. 90 capsule 3   empagliflozin (JARDIANCE) 25 MG TABS tablet Take 25 mg by mouth daily.     lactulose (CHRONULAC) 10 GM/15ML solution Take 30 mLs by mouth 3 (three) times daily.     Lancets (ONETOUCH DELICA PLUS [id number]) MISC Apply topically 3 (three) times daily.     metolazone (ZAROXOLYN) 5 MG tablet Take 1 table every 3 days  as needed for more than 4 pound weight gain. 30 tablet 2   metoprolol succinate (TOPROL-XL) 100 MG 24 hr tablet TO BE TAKEN WITH 50 MG RX  Take with or immediately following a meal. 90 tablet 3   metoprolol succinate (TOPROL-XL) 50 MG 24 hr  tablet Take 1 tablet (50 mg total) by mouth daily. TO BE TAKEN WITH 100 MG RX Take with or immediately following a meal. 90 tablet 3   ONETOUCH VERIO test strip 1 each 3 (three) times daily.     OZEMPIC, 0.25 OR 0.5 MG/DOSE, 2 MG/3ML SOPN Inject 0.5 mg into the skin once a week. Monday     potassium chloride (MICRO-K) 10 MEQ CR capsule TAKE 6 BY MOUTH TWICE A DAY (Patient taking differently: Take 30 mEq by mouth 2 (two) times daily.) 360 capsule 11   Propylene Glycol (SYSTANE BALANCE) 0.6 % SOLN Place 1 drop into both eyes 2 (two) times daily.     XIFAXAN 550 MG TABS tablet Take 550 mg by mouth 2 (two) times daily.     albuterol (PROVENTIL) (2.5 MG/3ML)  0.083% nebulizer solution Take 3 mLs (2.5 mg total) by nebulization every 6 (six) hours as needed for wheezing or shortness of breath. 24 mL 0   lidocaine (SALONPAS PAIN RELIEVING) 4 % Place 1 patch onto the skin daily as needed (back pain).     Multiple Vitamins-Minerals (PRESERVISION AREDS 2) CAPS Take 1 capsule by mouth 2 (two) times daily.      Allergies:  Allergies  Allergen Reactions   Celecoxib Other (See Comments)    "speech slurred" with celebrex   Shellfish Allergy Swelling and Rash    TINGLING AND SWELLING OF LIPS. LARGE WHELPS QUARTER-SIZE   Barium-Containing Compounds Other (See Comments)    TACHYCARDIA   Prednisone Other (See Comments)    TACHYCARDIA   Statins Other (See Comments)    AGGRAVATED FIBROMYALGIA   Fentanyl     Hallucinations    Ibuprofen Other (See Comments)    UNSPECIFIED SPECIFIC REACTION >> "DUE TO PLATELETS"   Tramadol     Hallucination    Tylenol [Acetaminophen] Other (See Comments)    UNSPECIFIED SPECIFIC REACTION >> "DUE TO PLATELETS"   Zetia [Ezetimibe]     Headache    Family History  Problem Relation Age of Onset   Heart failure Mother    Cancer Mother    Hyperlipidemia Mother    Congestive Heart Failure Mother    Stroke Mother    Lung cancer Father    Cancer Father    Cancer Brother    Hyperlipidemia Brother    Stroke Other     Social History:  reports that she quit smoking about 47 years ago. Her smoking use included cigarettes. She has a 3.00 pack-year smoking history. She has never used smokeless tobacco. She reports current alcohol use. She reports that she does not use drugs.   ROS: As per HPI, all others negative   Blood pressure (!) 121/56, pulse 98, temperature (!) 97.3 F (36.3 C), temperature source Temporal, resp. rate (!) 22, SpO2 95 %. General appearance: NAD, obese HEENT:  [place]/AT, anicteric NECK:  Thick, supple ABD:  Protuberant, soft, non-tender NEURO:  A/O, no encephalopathy CV:  Regular  Results for  orders placed or performed during the hospital encounter of 05/04/22 (from the past 48 hour(s))  Prepare platelet pheresis     Status: None (Preliminary result)   Collection Time: 05/04/22  6:39 AM  Result Value Ref Range   Unit Number [id number]    Blood Component Type PLTP1 PSORALEN TREATED    Unit division 00    Status of Unit ISSUED    Transfusion Status      OK TO TRANSFUSE Performed at [facility]  [address] [place], [place] [address]    Unit Number [id number]    Blood Component Type PLTP1 PSORALEN TREATED    Unit division 00    Status of Unit ISSUED    Transfusion Status OK TO TRANSFUSE   Glucose, capillary     Status: None   Collection Time: 05/04/22  8:00 AM  Result Value Ref Range   Glucose-Capillary 96 70 - 99 mg/dL    Comment: Glucose reference range applies only to samples taken after fasting for at least 8 hours.   No results found.  Assessment/Plan   Cirrhosis. Isolated gastric varices. Endoscopy. Risks (bleeding, infection, bowel perforation that could require surgery, sedation-related changes in cardiopulmonary systems), benefits (identification and possible treatment of source of symptoms, exclusion of certain causes of symptoms), and alternatives (watchful waiting, radiographic imaging studies, empiric medical treatment) of upper endoscopy (EGD) were explained to patient/family in detail and patient wishes to proceed.  Personal history colonic polyps. Colonoscopy. Risks (bleeding, infection, bowel perforation that could require surgery, sedation-related changes in cardiopulmonary systems), benefits (identification and possible treatment of source of symptoms, exclusion of certain causes of symptoms), and alternatives (watchful waiting, radiographic imaging studies, empiric medical treatment) of colonoscopy were explained to patient/family in detail and patient wishes to proceed.   Landry Dyke 05/04/2022, 9:40 AM

## 2022-05-05 LAB — BPAM PLATELET PHERESIS
Blood Product Expiration Date: 202308172359
Blood Product Expiration Date: 202308172359
ISSUE DATE / TIME: 202308160713
ISSUE DATE / TIME: 202308160929
Unit Type and Rh: 5100
Unit Type and Rh: 7300

## 2022-05-05 LAB — PREPARE PLATELET PHERESIS
Unit division: 0
Unit division: 0

## 2022-05-05 NOTE — Anesthesia Postprocedure Evaluation (Signed)
Anesthesia Post Note  Patient: Hannah Khan  Procedure(s) Performed: ESOPHAGOGASTRODUODENOSCOPY (EGD) WITH PROPOFOL (Bilateral) COLONOSCOPY WITH PROPOFOL (Bilateral)     Patient location during evaluation: PACU Anesthesia Type: MAC Level of consciousness: awake and alert Pain management: pain level controlled Vital Signs Assessment: post-procedure vital signs reviewed and stable Respiratory status: spontaneous breathing, nonlabored ventilation and respiratory function stable Cardiovascular status: blood pressure returned to baseline and stable Postop Assessment: no apparent nausea or vomiting Anesthetic complications: no   No notable events documented.  Last Vitals:  Vitals:   05/04/22 1150 05/04/22 1155  BP: (!) 127/33   Pulse: (!) 109 100  Resp: 17 19  Temp:    SpO2: 96% 94%    Last Pain:  Vitals:   05/04/22 1143  TempSrc: Oral  PainSc:                  Lynda Rainwater

## 2022-05-08 ENCOUNTER — Encounter (HOSPITAL_COMMUNITY): Payer: Self-pay | Admitting: Gastroenterology

## 2022-05-14 DIAGNOSIS — R519 Headache, unspecified: Secondary | ICD-10-CM | POA: Diagnosis not present

## 2022-05-14 DIAGNOSIS — L03113 Cellulitis of right upper limb: Secondary | ICD-10-CM | POA: Diagnosis not present

## 2022-05-17 ENCOUNTER — Telehealth (HOSPITAL_BASED_OUTPATIENT_CLINIC_OR_DEPARTMENT_OTHER): Payer: Self-pay | Admitting: Cardiovascular Disease

## 2022-05-17 NOTE — Telephone Encounter (Signed)
Dr Oval Linsey spoke with Dr Paulita Fujita

## 2022-05-17 NOTE — Telephone Encounter (Signed)
Pt c/o medication issue:  1. Name of Medication: metoprolol succinate (TOPROL-XL) 100 MG 24 hr tablet  2. How are you currently taking this medication (dosage and times per day)?TO BE TAKEN WITH 50 MG RX Take with or immediately following a meal.  3. Are you having a reaction (difficulty breathing--STAT)? No   4. What is your medication issue?   Dr. Paulita Fujita calling to see if the dr will be willing to change the patient medication from above to carvedilo; for gastr reason. Please advise

## 2022-05-26 DIAGNOSIS — H43813 Vitreous degeneration, bilateral: Secondary | ICD-10-CM | POA: Diagnosis not present

## 2022-05-26 DIAGNOSIS — H35033 Hypertensive retinopathy, bilateral: Secondary | ICD-10-CM | POA: Diagnosis not present

## 2022-05-26 DIAGNOSIS — E113292 Type 2 diabetes mellitus with mild nonproliferative diabetic retinopathy without macular edema, left eye: Secondary | ICD-10-CM | POA: Diagnosis not present

## 2022-05-26 DIAGNOSIS — H353112 Nonexudative age-related macular degeneration, right eye, intermediate dry stage: Secondary | ICD-10-CM | POA: Diagnosis not present

## 2022-05-26 DIAGNOSIS — H353221 Exudative age-related macular degeneration, left eye, with active choroidal neovascularization: Secondary | ICD-10-CM | POA: Diagnosis not present

## 2022-06-13 DIAGNOSIS — Z86010 Personal history of colonic polyps: Secondary | ICD-10-CM | POA: Diagnosis not present

## 2022-06-13 DIAGNOSIS — I864 Gastric varices: Secondary | ICD-10-CM | POA: Diagnosis not present

## 2022-06-13 DIAGNOSIS — K7460 Unspecified cirrhosis of liver: Secondary | ICD-10-CM | POA: Diagnosis not present

## 2022-06-24 DIAGNOSIS — M5413 Radiculopathy, cervicothoracic region: Secondary | ICD-10-CM | POA: Diagnosis not present

## 2022-06-24 DIAGNOSIS — M9901 Segmental and somatic dysfunction of cervical region: Secondary | ICD-10-CM | POA: Diagnosis not present

## 2022-06-24 DIAGNOSIS — M25511 Pain in right shoulder: Secondary | ICD-10-CM | POA: Diagnosis not present

## 2022-06-24 DIAGNOSIS — M9903 Segmental and somatic dysfunction of lumbar region: Secondary | ICD-10-CM | POA: Diagnosis not present

## 2022-06-24 DIAGNOSIS — M62830 Muscle spasm of back: Secondary | ICD-10-CM | POA: Diagnosis not present

## 2022-06-24 DIAGNOSIS — M25552 Pain in left hip: Secondary | ICD-10-CM | POA: Diagnosis not present

## 2022-06-24 DIAGNOSIS — M5417 Radiculopathy, lumbosacral region: Secondary | ICD-10-CM | POA: Diagnosis not present

## 2022-06-27 DIAGNOSIS — M25511 Pain in right shoulder: Secondary | ICD-10-CM | POA: Diagnosis not present

## 2022-06-27 DIAGNOSIS — M5417 Radiculopathy, lumbosacral region: Secondary | ICD-10-CM | POA: Diagnosis not present

## 2022-06-27 DIAGNOSIS — M25552 Pain in left hip: Secondary | ICD-10-CM | POA: Diagnosis not present

## 2022-06-27 DIAGNOSIS — M9903 Segmental and somatic dysfunction of lumbar region: Secondary | ICD-10-CM | POA: Diagnosis not present

## 2022-06-27 DIAGNOSIS — M62830 Muscle spasm of back: Secondary | ICD-10-CM | POA: Diagnosis not present

## 2022-06-27 DIAGNOSIS — M5413 Radiculopathy, cervicothoracic region: Secondary | ICD-10-CM | POA: Diagnosis not present

## 2022-06-27 DIAGNOSIS — M9901 Segmental and somatic dysfunction of cervical region: Secondary | ICD-10-CM | POA: Diagnosis not present

## 2022-07-11 ENCOUNTER — Other Ambulatory Visit (HOSPITAL_BASED_OUTPATIENT_CLINIC_OR_DEPARTMENT_OTHER): Payer: Self-pay | Admitting: Cardiovascular Disease

## 2022-07-11 NOTE — Telephone Encounter (Signed)
Rx(s) sent to pharmacy electronically.  

## 2022-07-14 ENCOUNTER — Ambulatory Visit (HOSPITAL_BASED_OUTPATIENT_CLINIC_OR_DEPARTMENT_OTHER): Payer: Medicare PPO | Admitting: Cardiovascular Disease

## 2022-07-14 ENCOUNTER — Encounter (HOSPITAL_BASED_OUTPATIENT_CLINIC_OR_DEPARTMENT_OTHER): Payer: Self-pay | Admitting: Cardiovascular Disease

## 2022-07-14 VITALS — BP 94/58 | HR 83 | Ht 67.0 in | Wt 339.9 lb

## 2022-07-14 DIAGNOSIS — Z5181 Encounter for therapeutic drug level monitoring: Secondary | ICD-10-CM | POA: Diagnosis not present

## 2022-07-14 DIAGNOSIS — G4733 Obstructive sleep apnea (adult) (pediatric): Secondary | ICD-10-CM

## 2022-07-14 DIAGNOSIS — I10 Essential (primary) hypertension: Secondary | ICD-10-CM

## 2022-07-14 DIAGNOSIS — I4821 Permanent atrial fibrillation: Secondary | ICD-10-CM

## 2022-07-14 DIAGNOSIS — I5032 Chronic diastolic (congestive) heart failure: Secondary | ICD-10-CM

## 2022-07-14 DIAGNOSIS — E785 Hyperlipidemia, unspecified: Secondary | ICD-10-CM

## 2022-07-14 DIAGNOSIS — I5043 Acute on chronic combined systolic (congestive) and diastolic (congestive) heart failure: Secondary | ICD-10-CM

## 2022-07-14 MED ORDER — OZEMPIC (1 MG/DOSE) 4 MG/3ML ~~LOC~~ SOPN: 1.0000 mg | PEN_INJECTOR | SUBCUTANEOUS | 5 refills | Status: DC

## 2022-07-14 MED ORDER — METOPROLOL SUCCINATE ER 50 MG PO TB24: ORAL_TABLET | ORAL | 3 refills | Status: DC

## 2022-07-14 NOTE — Assessment & Plan Note (Signed)
Blood pressures are chronically low.  Diltiazem and metoprolol are both needed for rate control.  She has dizziness now but that is more so attributable to vertigo.  Continue current regimen.  She was given information on Epley maneuvers.

## 2022-07-14 NOTE — Assessment & Plan Note (Signed)
She is a bit volume overloaded today.  She has been traveling and has not been taking her Bumex.  In general it has been well controlled.  She is back on her regimen and thinks that her edema is improving.  She has no shortness of breath or orthopnea.  Continue current regimen.

## 2022-07-14 NOTE — Progress Notes (Signed)
Cardiology Office Note  Date:  07/14/2022   ID:  Hannah Khan, DOB 12-09-1955, MRN [id number]  PCP:  Lujean Amel, MD  Cardiologist:   Skeet Latch, MD  Dr. Paulita Fujita: GI Dr. Alen Blew: Hematology  No chief complaint on file.  History of Present Illness:  Hannah Khan is a 66 y.o. female with hypertension, chronic atrial fibrillation, diabetes mellitus type 2, hyperlipidemia, cryptogenic cirrhosis, thrombocytopenia, OSA on CPAP, and morbid obesity who presents for follow-up.  Hannah Khan was admitted to the hospital 11/2015 with atrial fibrillation with rapid ventricular response. Given her history of portal hypertension and cirrhosis she was not anticoagulated. She was started on diltiazem for rate control and continued on her home propranolol. Her rate did not stay consistently less than 100 bpm so she was started on digoxin as well.  Echo revealed normal systolic function and her thyroid function was normal.  She was admitted to the hospital 04/1855 with diastolic heart failure.  She was diuresed with IV lasix and digoxin was discontinued due to hypokalemia.  She was seen in follow up 02/2016, at which time she complained of hypotension.  Her heart rate was well-controlled but she continued to feel poorly in AF.  She was referred to Dr. Rayann Heman for consideration of a Watchman device however, he felt that she would not be a good candidate, as she will only be able to be on antiplatelets for one month and she is unlikely to remain in sinus rhythm without significant weight loss.   Hannah Khan fell and broke her R arm 09/2017.  She was also hospitalized July and August 2019 with cellulitis.  During that hospitalization she had atrial fibrillation with rapid ventricular response.  She was initially treated with a diltiazem drip and then transitioned back to oral diltiazem.  Hannah Khan was admitted 08/2018 with atrial fibrillation with RVR.  Her heart rate was 160 at home and improved to 135 with  her medication.  She was given adenosine and then started on a diltiazem drip.  BNP was 266 and troponin was negative.  It was thought that the episode was triggered by dehydration from vomiting after colonoscopy and possibly viral gatroenteritis.  She had constant diarrhea after the procedure.  She was transitioned back to oral diltiazem.  She had struggled with lower extremity edema.    Hannah Khan saw Hannah Khan on 03/2020.  Her heart rate was 110 bpm and she was volume overloaded.  Bumex was increased to 4 mg twice daily for 3 days.  She followed up the following month and was doing better with her weight and edema.  She was hypotensive so diltiazem was reduced to 300 mg daily.  Hannah Khan was admitted 01/2021 with hepatic encephalopathy. Metolazone was discontinued. She was then admitted 02/2021 with community acquired pneumonia and volume overload. She was admitted 03/2021 with acute on chronic diastolic heart failure. She followed up with Hannah Khan later that month and was doing well. Hannah Khan reported maintaining a stable weight and general improvement in her LE edema. Since the dosage of her diltiazem was adjusted in the hospital, she had noticed blood pressures as low as 77/47. She was better managing her volume status since retirement because she was able to take her diuretics. She had melena and an in-hospital colonoscopy was recommended.   Diltiazem was reduced to 240 mg, and spironolactone was split to 12.5 mg BID due to hypotension. She followed up with Hannah [place], Khan on 11/16/2021 and her  BP was 104/60, frequently <100 at home. Her weight at home was stable, taking prn Metolazone 1-2 times per week. Diltiazem was reduced to 180 mg QD and Lopressor was increased to 75 mg BID.  She underwent upper and lower endoscopy 04/2022 and was found to have diverticulosis and grade 1 varices in the lower esophagus that were small in size.  She had moderate portal hypertensive gastropathy.  She  reports that her breathing has been fine, but she has been experiencing dizziness, particularly upon standing and walking. She mentions that the dizziness has been ongoing since this morning and is still present even when sitting. The patient has a history of vertigo and is not currently congested or experiencing any cold symptoms.  Hannah Khan mentions that she has been taking a reduced amount of potassium, currently at four milliequivalents in the morning and four at night, and is considering further reducing the dosage. She also discusses her metoprolol prescription and the possibility of changing it to 350 milligram tablets to avoid two copays. The patient is on Ozempic for blood sugar control and reports good results, with a blood sugar level of 122 this morning. However, she states that it has not been helpful for weight loss and is considering increasing the dosage to one milligram.  The patient describes her recent trip and the long hours spent sitting in the car. She also mentions a fall on a cruise ship during her [place] trip, which resulted in a headache and neck pain.  Past Medical History:  Diagnosis Date   Anemia    hx of   Arthritis    knees   Diabetes mellitus without complication (HCC)    type 2   Fibromyalgia    Hx of colonic polyps    s/p partial colectomy   Hyperlipidemia    Joint pain    in knees and back spasms   Left leg swelling    wear compression hose   Liver cirrhosis secondary to NASH (nonalcoholic steatohepatitis) ([place]) dx nov 2014   had enlarged spleen also    Morbid obesity ([place])    Persistent atrial fibrillation ([address])    Pneumonia 10/2016   PONV (postoperative nausea and vomiting)    in past none recent   Portal hypertension ([place])    Sleep apnea    uses cpap setting of 14   Spleen enlarged    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 [facility])     Past Surgical History:  Procedure Laterality Date   BREAST LUMPECTOMY WITH RADIOACTIVE SEED LOCALIZATION  Right 08/29/2017   Procedure: RIGHT BREAST LUMPECTOMY WITH RADIOACTIVE SEEDS LOCALIZATION;  Surgeon: Erroll Luna, MD;  Location: [facility];  Service: General;  Laterality: Right;   [place]  20 yrs ago   COLECTOMY  2011   COLONOSCOPY     COLONOSCOPY N/A 09/05/2018   Procedure: COLONOSCOPY;  Surgeon: [name], MD;  Location: [facility];  Service: Endoscopy;  Laterality: N/A;   COLONOSCOPY WITH PROPOFOL Bilateral 05/04/2022   Procedure: COLONOSCOPY WITH PROPOFOL;  Surgeon: [name], MD;  Location: [facility];  Service: Gastroenterology;  Laterality: Bilateral;   DILATATION & CURETTAGE/HYSTEROSCOPY WITH MYOSURE N/A 05/06/2016   Procedure: DILATATION & CURETTAGE/HYSTEROSCOPY WITH MYOSURE WITH POLYPECTOMY;  Surgeon: Janyth Pupa, DO;  Location: [facility];  Service: Gynecology;  Laterality: N/A;   ESOPHAGOGASTRODUODENOSCOPY (EGD) WITH PROPOFOL N/A 09/04/2013   Procedure: ESOPHAGOGASTRODUODENOSCOPY (EGD) WITH PROPOFOL;  Surgeon: [name], MD;  [facility];  Service: Endoscopy;  Laterality:  N/A;   ESOPHAGOGASTRODUODENOSCOPY (EGD) WITH PROPOFOL Bilateral 05/04/2022   Procedure: ESOPHAGOGASTRODUODENOSCOPY (EGD) WITH PROPOFOL;  Surgeon: [name], MD;  [facility];  Service: Gastroenterology;  Laterality: Bilateral;   KNEE ARTHROSCOPY  yrs ago   bilateral, one done x 1, one done twice   POLYPECTOMY  09/05/2018   Procedure: POLYPECTOMY;  Surgeon: [name], MD;  Location: [facility];  Service: Endoscopy;;     Current Outpatient Medications  Medication Sig Dispense Refill   albuterol (PROVENTIL) (2.5 MG/3ML) 0.083% nebulizer solution Take 3 mLs (2.5 mg total) by nebulization every 6 (six) hours as needed for wheezing or shortness of breath. 24 mL 0   Bacitracin-Polymyxin B (NEOSPORIN EX) Apply 1 Application topically daily as needed (wound Treatment).     bumetanide (BUMEX) 2 MG tablet Take 1 tablet (2 mg total) by  mouth 2 (two) times daily. (Patient taking differently: Take 2 mg by mouth daily.) 180 tablet 3   diltiazem (CARDIZEM CD) 180 MG 24 hr capsule Take 1 capsule (180 mg total) by mouth daily. 90 capsule 3   empagliflozin (JARDIANCE) 25 MG TABS tablet Take 25 mg by mouth daily.     lactulose (CHRONULAC) 10 GM/15ML solution Take 30 mLs by mouth 3 (three) times daily.     Lancets (ONETOUCH DELICA PLUS [id number]) MISC Apply topically 3 (three) times daily.     lidocaine (SALONPAS PAIN RELIEVING) 4 % Place 1 patch onto the skin daily as needed (back pain).     metolazone (ZAROXOLYN) 5 MG tablet TAKE 1 TABLE EVERY 3 DAYS AS NEEDED FOR MORE THAN 4 POUND WEIGHT GAIN. 30 tablet 2   Multiple Vitamins-Minerals (PRESERVISION AREDS 2) CAPS Take 1 capsule by mouth 2 (two) times daily.     ONETOUCH VERIO test strip 1 each 3 (three) times daily.     potassium chloride (MICRO-K) 10 MEQ CR capsule Take 40 mEq by mouth 2 (two) times daily.     Propylene Glycol (SYSTANE BALANCE) 0.6 % SOLN Place 1 drop into both eyes 2 (two) times daily.     Semaglutide, 1 MG/DOSE, (OZEMPIC, 1 MG/DOSE,) 4 MG/3ML SOPN Inject 1 mg into the skin every 14 (fourteen) days. 1 mL 5   XIFAXAN 550 MG TABS tablet Take 550 mg by mouth 2 (two) times daily.     metoprolol succinate (TOPROL-XL) 50 MG 24 hr tablet TAKE 3 TABLETS ONCE A DAY Take with or immediately following a meal. 270 tablet 3   No current facility-administered medications for this visit.    Allergies:   Celecoxib, Shellfish allergy, Barium-containing compounds, Prednisone, Statins, Fentanyl, Ibuprofen, Tramadol, Tylenol [acetaminophen], and Zetia [ezetimibe]    Social History:  The patient  reports that she quit smoking about 47 years ago. Her smoking use included cigarettes. She has a 3.00 pack-year smoking history. She has never used smokeless tobacco. She reports current alcohol use. She reports that she does not use drugs.   Family History:  [name] family history  includes Cancer in her brother, father, and mother; Congestive Heart Failure in her mother; Heart failure in her mother; Hyperlipidemia in her brother and mother; Lung cancer in her father; Stroke in her mother and another family member.    ROS:   Please see the history of present illness. (+) Right knee pain (+) Edema (+) Vertigo All other systems are reviewed and negative.    PHYSICAL EXAM: VS:  BP (!) 94/58 (BP Location: Right Arm, Patient Position: Sitting, Cuff Size: Normal)   Pulse  83   Ht 5' 7"  (1.702 m)   Wt (!) 339 lb 14.4 oz (154.2 kg)   BMI 53.24 kg/m  , BMI Body mass index is 53.24 kg/m. GENERAL:  Well-appearing.  HEENT: Pupils equal round and reactive, fundi not visualized, oral mucosa unremarkable NECK:  No jugular venous distention, waveform within normal limits, carotid upstroke brisk and symmetric, no bruits LUNGS:  Clear to auscultation bilaterally HEART:  Irregularly irregular.  PMI not displaced or sustained,S1 and S2 within normal limits, no S3, no S4, no clicks, no rubs, II/VI systolic murmur at the LUSB ABD:  Flat, positive bowel sounds normal in frequency in pitch, no bruits, no rebound, no guarding, no midline pulsatile mass, no hepatomegaly, no splenomegaly EXT:  2 plus pulses throughout, 2+ LE edema bilaterally, no cyanosis no clubbing SKIN:  No rashes no nodules NEURO:  Cranial nerves II through XII grossly intact, motor grossly intact throughout PSYCH:  Cognitively intact, oriented to person place and time  EKG:    EKG is personally reviewed. 01/12/2022: EKG was not ordered. 09/16/2021: Atrial fibrillation. Rate 81 bpm. PVC. iRBBB. 06/14/2021: EKG is not ordered today. 06/17/20: Atrial fibrillation.  Rate 106 bpm.  Low voltage.  LAD. 11/16/18: Atrial fibrillation.  Rate 73 bpm.  Low voltage.  LAFB  Incomplete RBBB 06/22/2018: Atrial fibrillation.  Rate 84 bpm.  Incomplete right bundle branch block.  Low voltage.  LAFB. 06/06/17: Atrial fibrillation.  Rate  76 bpm.   02/06/17: atrial fibrillation. Rate 95 bpm. Low voltage precordial leads. Incomplete right bundle branch block. 11/14/16: Atrial fibrillation. Rate 87 bpm. 06/06/16: Atrial fibrillation. Rate 93 bpm. Low voltage.  Echo 03/20/2021:  1. Technically difficult; normal LV function; enlarged right heart.   2. Left ventricular ejection fraction, by estimation, is 60 to 65%. The  left ventricle has normal function. The left ventricle has no regional  wall motion abnormalities. There is mild left ventricular hypertrophy.  Left ventricular diastolic parameters  are indeterminate.   3. Right ventricular systolic function is normal. The right ventricular  size is moderately enlarged. There is normal pulmonary artery systolic  pressure.   4. Left atrial size was mildly dilated.   5. Right atrial size was severely dilated.   6. Large pleural effusion.   7. The mitral valve is normal in structure. Trivial mitral valve  regurgitation. No evidence of mitral stenosis.   8. The aortic valve is calcified. Aortic valve regurgitation is not  visualized. No aortic stenosis is present.   9. Aortic dilatation noted. There is borderline dilatation of the  ascending aorta, measuring 38 mm.  10. The inferior vena cava is dilated in size with >50% respiratory  variability, suggesting right atrial pressure of 8 mmHg.  Echo 12/10/15: Study Conclusions   - Procedure narrative: Transthoracic echocardiography. The study   was technically difficult, as a result of body habitus.   Intravenous contrast (Definity) was administered. - Left ventricle: The cavity size was normal. Wall thickness was   increased in a pattern of mild LVH. Systolic function was normal.   The estimated ejection fraction was in the range of 55% to 60%.   Wall motion was normal; there were no regional wall motion   abnormalities. The study is not technically sufficient to allow   evaluation of LV diastolic function. - Mitral valve:  Mildly thickened leaflets . There was trivial   regurgitation. - Left atrium: The atrium was mildly dilated. - Inferior vena cava: The vessel was dilated. The respirophasic  diameter changes were blunted (< 50%), consistent with elevated   central venous pressure. - Pericardium, extracardiac: A trivial pericardial effusion was   identified posterior to the heart.   Impressions:   - Technically difficult study. The patient was in atrial flutter   during the study. The LVEF is 55-60%, mild LVH, grossly normal   wall motion, trivial MR, mild LAE, dilated IVC, trivial posterior   pericardial effusion.  Recent Labs: 09/16/2021: ALT 17 11/16/2021: Hemoglobin 10.8; Platelets 33 01/26/2022: BUN 22; Creatinine, Ser 1.01; Potassium 4.0; Sodium 137    Lipid Panel    Component Value Date/Time   CHOL 175 07/02/2014 1138   TRIG 93 07/02/2014 1138   HDL 59 07/02/2014 1138   CHOLHDL 3.0 07/02/2014 1138   VLDL 19 07/02/2014 1138   LDLCALC 97 07/02/2014 1138    Wt Readings from Last 3 Encounters:  07/14/22 (!) 339 lb 14.4 oz (154.2 kg)  05/04/22 (!) 327 lb (148.3 kg)  04/13/22 (!) 324 lb 9.6 oz (147.2 kg)     ASSESSMENT AND PLAN:  Essential hypertension Blood pressures are chronically low.  Diltiazem and metoprolol are both needed for rate control.  She has dizziness now but that is more so attributable to vertigo.  Continue current regimen.  She was given information on Epley maneuvers.  Acute on chronic combined systolic and diastolic CHF (congestive heart failure) ([place]) She is a bit volume overloaded today.  She has been traveling and has not been taking her Bumex.  In general it has been well controlled.  She is back on her regimen and thinks that her edema is improving.  She has no shortness of breath or orthopnea.  Continue current regimen.  Chronic diastolic heart failure ([place]) She is a bit volume overloaded today.  She has been traveling and has not been taking her Bumex.  In  general it has been well controlled.  She is back on her regimen and thinks that her edema is improving.  She has no shortness of breath or orthopnea.  Continue current regimen.  Permanent atrial fibrillation ([place]) She remains in rate controlled atrial fibrillation.  Continue diltiazem and metoprolol.  She is not on anticoagulation due to gastric varices and high bleeding risk.  Not a candidate for Watchman device.  Obstructive sleep apnea Use CPAP.  Hyperlipidemia Check fasting lipids and CMP today.   Current medicines are reviewed at length with the patient today.  The patient does not have concerns regarding medicines.   The following changes have been made: increase Bumex.  Switch metoprolol to succinate.   Labs/ tests ordered today include:   Orders Placed This Encounter  Procedures   Lipid panel   Comprehensive metabolic panel     Disposition:    FU with APP in 3 months. FU with Mavrik Bynum C. Oval Linsey, MD, [facility] in 6 months.   Signed, Gibran Veselka C. Oval Linsey, MD, [facility]  07/14/2022 9:37 AM    [facility]

## 2022-07-14 NOTE — Assessment & Plan Note (Signed)
Use CPAP.

## 2022-07-14 NOTE — Patient Instructions (Addendum)
Medication Instructions:  INCREASE YOUR OZEMPIC TO 1 MG INJECTION EVER 14 DAYS   Labwork: LP/CMET TODAY   Testing/Procedures: NONE  Follow-Up: 3 MONTHS WITH CAITLIN W NP   6 MONTHS WITH DR [facility]   Any Other Special Instructions Will Be Listed Below (If Applicable).  Epley maneuvers for vertigo: [email or web address]

## 2022-07-14 NOTE — Assessment & Plan Note (Addendum)
She remains in rate controlled atrial fibrillation.  Continue diltiazem and metoprolol.  She is not on anticoagulation due to gastric varices and high bleeding risk.  Not a candidate for Watchman device.

## 2022-07-14 NOTE — Assessment & Plan Note (Signed)
Check fasting lipids and CMP today.

## 2022-07-15 LAB — COMPREHENSIVE METABOLIC PANEL
ALT: 23 IU/L (ref 0–32)
AST: 35 IU/L (ref 0–40)
Albumin/Globulin Ratio: 1.3 (ref 1.2–2.2)
Albumin: 3.6 g/dL — ABNORMAL LOW (ref 3.9–4.9)
Alkaline Phosphatase: 112 IU/L (ref 44–121)
BUN/Creatinine Ratio: 18 (ref 12–28)
BUN: 18 mg/dL (ref 8–27)
Bilirubin Total: 4.3 mg/dL — ABNORMAL HIGH (ref 0.0–1.2)
CO2: 27 mmol/L (ref 20–29)
Calcium: 9.4 mg/dL (ref 8.7–10.3)
Chloride: 101 mmol/L (ref 96–106)
Creatinine, Ser: 1.01 mg/dL — ABNORMAL HIGH (ref 0.57–1.00)
Globulin, Total: 2.8 g/dL (ref 1.5–4.5)
Glucose: 126 mg/dL — ABNORMAL HIGH (ref 70–99)
Potassium: 3.5 mmol/L (ref 3.5–5.2)
Sodium: 142 mmol/L (ref 134–144)
Total Protein: 6.4 g/dL (ref 6.0–8.5)
eGFR: 61 mL/min/{1.73_m2} (ref >59)

## 2022-07-15 LAB — LIPID PANEL
Chol/HDL Ratio: 2 ratio (ref 0.0–4.4)
Cholesterol, Total: 167 mg/dL (ref 100–199)
HDL: 84 mg/dL (ref >39)
LDL Chol Calc (NIH): 69 mg/dL (ref 0–99)
Triglycerides: 77 mg/dL (ref 0–149)
VLDL Cholesterol Cal: 14 mg/dL (ref 5–40)

## 2022-07-17 DIAGNOSIS — L03113 Cellulitis of right upper limb: Secondary | ICD-10-CM | POA: Diagnosis not present

## 2022-07-21 ENCOUNTER — Ambulatory Visit (HOSPITAL_BASED_OUTPATIENT_CLINIC_OR_DEPARTMENT_OTHER)
Admission: RE | Admit: 2022-07-21 | Discharge: 2022-07-21 | Disposition: A | Discharge status: Home / Self Care | Payer: Medicare PPO | Source: Ambulatory Visit | Attending: Family Medicine | Admitting: Family Medicine

## 2022-07-21 ENCOUNTER — Other Ambulatory Visit (HOSPITAL_BASED_OUTPATIENT_CLINIC_OR_DEPARTMENT_OTHER): Payer: Self-pay | Admitting: Family Medicine

## 2022-07-21 DIAGNOSIS — L03113 Cellulitis of right upper limb: Secondary | ICD-10-CM | POA: Diagnosis not present

## 2022-07-21 DIAGNOSIS — I82621 Acute embolism and thrombosis of deep veins of right upper extremity: Secondary | ICD-10-CM | POA: Insufficient documentation

## 2022-07-21 DIAGNOSIS — M7989 Other specified soft tissue disorders: Secondary | ICD-10-CM | POA: Diagnosis not present

## 2022-07-21 DIAGNOSIS — Z23 Encounter for immunization: Secondary | ICD-10-CM | POA: Diagnosis not present

## 2022-07-21 DIAGNOSIS — L989 Disorder of the skin and subcutaneous tissue, unspecified: Secondary | ICD-10-CM | POA: Diagnosis not present

## 2022-07-25 DIAGNOSIS — J1189 Influenza due to unidentified influenza virus with other manifestations: Secondary | ICD-10-CM | POA: Diagnosis not present

## 2022-07-29 DIAGNOSIS — E119 Type 2 diabetes mellitus without complications: Secondary | ICD-10-CM | POA: Diagnosis not present

## 2022-07-29 DIAGNOSIS — K7460 Unspecified cirrhosis of liver: Secondary | ICD-10-CM | POA: Diagnosis not present

## 2022-07-29 DIAGNOSIS — D696 Thrombocytopenia, unspecified: Secondary | ICD-10-CM | POA: Diagnosis not present

## 2022-09-01 DIAGNOSIS — H353112 Nonexudative age-related macular degeneration, right eye, intermediate dry stage: Secondary | ICD-10-CM | POA: Diagnosis not present

## 2022-09-01 DIAGNOSIS — H35033 Hypertensive retinopathy, bilateral: Secondary | ICD-10-CM | POA: Diagnosis not present

## 2022-09-01 DIAGNOSIS — E113293 Type 2 diabetes mellitus with mild nonproliferative diabetic retinopathy without macular edema, bilateral: Secondary | ICD-10-CM | POA: Diagnosis not present

## 2022-09-01 DIAGNOSIS — H43813 Vitreous degeneration, bilateral: Secondary | ICD-10-CM | POA: Diagnosis not present

## 2022-09-01 DIAGNOSIS — H353221 Exudative age-related macular degeneration, left eye, with active choroidal neovascularization: Secondary | ICD-10-CM | POA: Diagnosis not present

## 2022-09-02 ENCOUNTER — Other Ambulatory Visit: Payer: Self-pay | Admitting: Gastroenterology

## 2022-09-02 DIAGNOSIS — Z86010 Personal history of colonic polyps: Secondary | ICD-10-CM | POA: Diagnosis not present

## 2022-09-02 DIAGNOSIS — I864 Gastric varices: Secondary | ICD-10-CM

## 2022-09-02 DIAGNOSIS — K7460 Unspecified cirrhosis of liver: Secondary | ICD-10-CM | POA: Diagnosis not present

## 2022-09-02 DIAGNOSIS — K7469 Other cirrhosis of liver: Secondary | ICD-10-CM

## 2022-09-21 ENCOUNTER — Ambulatory Visit
Admission: RE | Admit: 2022-09-21 | Discharge: 2022-09-21 | Disposition: A | Discharge status: Home / Self Care | Payer: Medicare PPO | Source: Ambulatory Visit | Attending: Gastroenterology | Admitting: Gastroenterology

## 2022-09-21 DIAGNOSIS — I864 Gastric varices: Secondary | ICD-10-CM

## 2022-09-21 DIAGNOSIS — K7469 Other cirrhosis of liver: Secondary | ICD-10-CM

## 2022-09-21 HISTORY — PX: IR RADIOLOGIST EVAL & MGMT: IMG5224

## 2022-09-21 NOTE — Consult Note (Signed)
Chief Complaint: Patient was seen in virtual telephone consultation today for gastroesophageal varices  Referring Physician(s): Outlaw,William  History of Present Illness: Hannah Khan is a 67 y.o. female with history of NASH cirrhosis initially diagnosed in 2009.  She is graciously referred by Dr. Paulita Fujita for gastroesophageal varices visualized on recent endoscopy.  She denies any prior history of hematemesis, hematochezia, or melena.  She has a history of hemorrhoids which were treated successfully.  She has never has a paracentesis.  She reports a single episode of profound hepatic encephalopathy years ago which required hospitalization, and has been compliant with lactulose and rifaximin since then, only experiencing very mild encephalopathy if doses are missed or delayed for various reasons.  She does not drink alcohol and has no significant history of such.  She takes bumex and occasionally another medication when she feels fluid overload.    She is looking forward to an upcoming [place] cruise later this month.    Past Medical History:  Diagnosis Date   Anemia    hx of   Arthritis    knees   Diabetes mellitus without complication (HCC)    type 2   Fibromyalgia    Hx of colonic polyps    s/p partial colectomy   Hyperlipidemia    Joint pain    in knees and back spasms   Left leg swelling    wear compression hose   Liver cirrhosis secondary to NASH (nonalcoholic steatohepatitis) ([place]) dx nov 2014   had enlarged spleen also    Morbid obesity ([employer])    Persistent atrial fibrillation ([place])    Pneumonia 10/2016   PONV (postoperative nausea and vomiting)    in past none recent   Portal hypertension ([place])    Sleep apnea    uses cpap setting of 14   Spleen enlarged    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 [facility])     Past Surgical History:  Procedure Laterality Date   BREAST LUMPECTOMY WITH RADIOACTIVE SEED LOCALIZATION Right 08/29/2017   Procedure: RIGHT BREAST  LUMPECTOMY WITH RADIOACTIVE SEEDS LOCALIZATION;  Surgeon: Erroll Luna, MD;  Location: [place];  Service: General;  Laterality: Right;   [place]  20 yrs ago   COLECTOMY  2011   COLONOSCOPY     COLONOSCOPY N/A 09/05/2018   Procedure: COLONOSCOPY;  Surgeon: [name], MD;  [facility];  Service: Endoscopy;  Laterality: N/A;   COLONOSCOPY WITH PROPOFOL Bilateral 05/04/2022   Procedure: COLONOSCOPY WITH PROPOFOL;  Surgeon: [name], MD;  [facility];  Service: Gastroenterology;  Laterality: Bilateral;   DILATATION & CURETTAGE/HYSTEROSCOPY WITH MYOSURE N/A 05/06/2016   Procedure: DILATATION & CURETTAGE/HYSTEROSCOPY WITH MYOSURE WITH POLYPECTOMY;  Surgeon: Janyth Pupa, DO;  Location: [facility];  Service: Gynecology;  Laterality: N/A;   ESOPHAGOGASTRODUODENOSCOPY (EGD) WITH PROPOFOL N/A 09/04/2013   Procedure: ESOPHAGOGASTRODUODENOSCOPY (EGD) WITH PROPOFOL;  Surgeon: [name], MD;  [facility];  Service: Endoscopy;  Laterality: N/A;   ESOPHAGOGASTRODUODENOSCOPY (EGD) WITH PROPOFOL Bilateral 05/04/2022   Procedure: ESOPHAGOGASTRODUODENOSCOPY (EGD) WITH PROPOFOL;  Surgeon: [name], MD;  [facility];  Service: Gastroenterology;  Laterality: Bilateral;   KNEE ARTHROSCOPY  yrs ago   bilateral, one done x 1, one done twice   POLYPECTOMY  09/05/2018   Procedure: POLYPECTOMY;  Surgeon: [name], MD;  [facility];  Service: Endoscopy;;    Allergies: Celecoxib, Shellfish allergy, Barium-containing compounds, Prednisone, Statins, Fentanyl, Ibuprofen, Tramadol, Tylenol [acetaminophen], and Zetia [ezetimibe]  Medications: Prior  to Admission medications   Medication Sig Start Date End Date Taking? Authorizing Provider  albuterol (PROVENTIL) (2.5 MG/3ML) 0.083% nebulizer solution Take 3 mLs (2.5 mg total) by nebulization every 6 (six) hours as needed for wheezing or shortness of breath. 03/22/21    Dessa Phi, DO  Bacitracin-Polymyxin B (NEOSPORIN EX) Apply 1 Application topically daily as needed (wound Treatment).    [provider]  bumetanide (BUMEX) 2 MG tablet Take 1 tablet (2 mg total) by mouth 2 (two) times daily. Patient taking differently: Take 2 mg by mouth daily. 01/12/22   Skeet Latch, MD  diltiazem (CARDIZEM CD) 180 MG 24 hr capsule Take 1 capsule (180 mg total) by mouth daily. 11/16/21   Loel Dubonnet, NP  empagliflozin (JARDIANCE) 25 MG TABS tablet Take 25 mg by mouth daily.    [provider]  lactulose (CHRONULAC) 10 GM/15ML solution Take 30 mLs by mouth 3 (three) times daily. 03/17/21   [provider]  Lancets (ONETOUCH DELICA PLUS [id number]) MISC Apply topically 3 (three) times daily. 04/10/20   [provider]  lidocaine (SALONPAS PAIN RELIEVING) 4 % Place 1 patch onto the skin daily as needed (back pain).    [provider]  metolazone (ZAROXOLYN) 5 MG tablet TAKE 1 TABLE EVERY 3 DAYS AS NEEDED FOR MORE THAN 4 POUND WEIGHT GAIN. 07/11/22   Skeet Latch, MD  metoprolol succinate (TOPROL-XL) 50 MG 24 hr tablet TAKE 3 TABLETS ONCE A DAY Take with or immediately following a meal. 07/14/22   Skeet Latch, MD  Multiple Vitamins-Minerals (PRESERVISION AREDS 2) CAPS Take 1 capsule by mouth 2 (two) times daily.    [provider]  [facility] VERIO test strip 1 each 3 (three) times daily. 04/13/20   [provider]  potassium chloride (MICRO-K) 10 MEQ CR capsule Take 40 mEq by mouth 2 (two) times daily.    [provider]  Propylene Glycol (SYSTANE BALANCE) 0.6 % SOLN Place 1 drop into both eyes 2 (two) times daily.    [provider]  Semaglutide, 1 MG/DOSE, (OZEMPIC, 1 MG/DOSE,) 4 MG/3ML SOPN Inject 1 mg into the skin every 14 (fourteen) days. 07/14/22   Skeet Latch, MD  XIFAXAN 550 MG TABS tablet Take 550 mg by mouth 2 (two) times daily. 03/13/21   [provider]      Family History  Problem Relation Age of Onset   Heart failure Mother    Cancer Mother    Hyperlipidemia Mother    Congestive Heart Failure Mother    Stroke Mother    Lung cancer Father    Cancer Father    Cancer Brother    Hyperlipidemia Brother    Stroke Other     Social History   Socioeconomic History   Marital status: Married    Spouse name: Not on file   Number of children: Not on file   Years of education: Not on file   Highest education level: Not on file  Occupational History   Occupation: Merchant navy officer  Tobacco Use   Smoking status: Former    Packs/day: 1.00    Years: 3.00    Total pack years: 3.00    Types: Cigarettes    Quit date: 09/19/1974    Years since quitting: 48.0   Smokeless tobacco: Never  Vaping Use   Vaping Use: Never used  Substance and Sexual Activity   Alcohol use: Yes    Comment: occ   Drug use: No   Sexual activity:  Not on file  Other Topics Concern   Not on file  Social History Narrative   Lives in [place] with her husband.  Instructor for early childhood development.     Social Determinants of Health   Financial Resource Strain: Not on file  Food Insecurity: Not on file  Transportation Needs: Not on file  Physical Activity: Not on file  Stress: Not on file  Social Connections: Not on file     Review of Systems: A 12 point ROS discussed and pertinent positives are indicated in the HPI above.  All other systems are negative.  Vital Signs: There were no vitals taken for this visit.  No physical examination was performed in lieu of virtual telephone clinic visit.  Imaging: CT AP 03/16/20  Large gastroesophageal varices, cirrhosis.  Suggestion of portal thrombus.  Large gastrorenal shunt.  Echocardiogram (03/20/21): IMPRESSIONS   1. Technically difficult; normal LV function; enlarged right heart.   2. Left ventricular ejection fraction, by estimation, is 60 to 65%. The  left ventricle has normal function. The left  ventricle has no regional  wall motion abnormalities. There is mild left ventricular hypertrophy.  Left ventricular diastolic parameters  are indeterminate.   3. Right ventricular systolic function is normal. The right ventricular  size is moderately enlarged. There is normal pulmonary artery systolic  pressure.   4. Left atrial size was mildly dilated.   5. Right atrial size was severely dilated.   6. Large pleural effusion.   7. The mitral valve is normal in structure. Trivial mitral valve  regurgitation. No evidence of mitral stenosis.   8. The aortic valve is calcified. Aortic valve regurgitation is not  visualized. No aortic stenosis is present.   9. Aortic dilatation noted. There is borderline dilatation of the  ascending aorta, measuring 38 mm.  10. The inferior vena cava is dilated in size with >50% respiratory  variability, suggesting right atrial pressure of 8 mmHg.   EGD (05/04/22) - Grade I esophageal varices. - Portal hypertensive gastropathy. - Type 1 isolated gastric varices (IGV1, varices located in the fundus), without bleeding. - Submucosal nodule found in the duodenum. - No specimens collected.    Labs:  CBC: Recent Labs    11/16/21 1111  WBC 5.3  HGB 10.8*  HCT 33.6*  PLT 33*    COAGS: No results for input(s): "INR", "APTT" in the last 8760 hours.  BMP: Recent Labs    11/16/21 1111 01/26/22 1432 07/14/22 0943  NA 142 137 142  K 3.5 4.0 3.5  CL 102 98 101  CO2 '23 27 27  '$ GLUCOSE 191* 177* 126*  BUN '22 22 18  '$ CALCIUM 9.4 8.9 9.4  CREATININE 1.11* 1.01* 1.01*    LIVER FUNCTION TESTS: Recent Labs    07/14/22 0943  BILITOT 4.3*  AST 35  ALT 23  ALKPHOS 112  PROT 6.4  ALBUMIN 3.6*    TUMOR MARKERS: No results for input(s): "AFPTM", "CEA", "CA199", "CHROMGRNA" in the last 8760 hours.  Child Pugh B (8 points) MELD 15 (6.0% estimated 3 month mortality) FIPS = 0.78 (Overall survival predicted at 1 month 90.5%, 3 months 71.1%, and 6  months 59.7%)  Assessment and Plan: 67 year old female with history of NASH cirrhosis (Child Pugh B, MELD 15), portal hypertension, and gastroesophageal varices without history of hemorrhage.  Given size of varices on prior CT from 2021 and findings on most recent EGD, she is at high risk for hemorrhage.  Her large gastrorenal  shunt is likely the etiology of her prior encephalopathy, particularly if there is main portal vein thrombus as suggested on prior CT.  We discussed the natural history of cirrhosis and portal hypertension, specifically variceal development and spontaneous portosystemic shunting. We also discussed the procedural steps and overview of both transvenous obliteration and TIPS creation.  Moreover, we discussed how these may apply to her case, however we need updated cross sectional imaging of her portal system to determine the best next steps.  -obtain CTA abdomen/pelvis (BRTO protocol)  -follow up in IR clinic on 1/18 if possible, prior to departure for upcoming [place], [place] Pager: [phone] Clinic: [phone]    I spent a total of  60 Minutes in virtual telephone clinical consultation, greater than 50% of which was counseling/coordinating care for portal hypertension.

## 2022-09-22 ENCOUNTER — Other Ambulatory Visit: Payer: Self-pay | Admitting: Interventional Radiology

## 2022-09-22 DIAGNOSIS — K7581 Nonalcoholic steatohepatitis (NASH): Secondary | ICD-10-CM

## 2022-09-22 DIAGNOSIS — I8500 Esophageal varices without bleeding: Secondary | ICD-10-CM

## 2022-09-30 ENCOUNTER — Ambulatory Visit (HOSPITAL_COMMUNITY)
Admission: RE | Admit: 2022-09-30 | Discharge: 2022-09-30 | Disposition: A | Discharge status: Home / Self Care | Payer: Medicare PPO | Source: Ambulatory Visit | Attending: Interventional Radiology | Admitting: Interventional Radiology

## 2022-09-30 ENCOUNTER — Encounter (HOSPITAL_COMMUNITY): Payer: Self-pay

## 2022-09-30 DIAGNOSIS — I8500 Esophageal varices without bleeding: Secondary | ICD-10-CM | POA: Diagnosis not present

## 2022-09-30 DIAGNOSIS — J9811 Atelectasis: Secondary | ICD-10-CM | POA: Diagnosis not present

## 2022-09-30 DIAGNOSIS — I864 Gastric varices: Secondary | ICD-10-CM | POA: Insufficient documentation

## 2022-09-30 LAB — POCT I-STAT CREATININE: Creatinine, Ser: 0.9 mg/dL (ref 0.44–1.00)

## 2022-09-30 MED ORDER — SODIUM CHLORIDE (PF) 0.9 % IJ SOLN
INTRAMUSCULAR | Status: AC
  Filled 2022-09-30: qty 50

## 2022-09-30 MED ORDER — IOHEXOL 350 MG/ML SOLN
100.0000 mL | Freq: Once | INTRAVENOUS | Status: AC | PRN
  Administered 2022-09-30: 100 mL via INTRAVENOUS

## 2022-10-05 ENCOUNTER — Other Ambulatory Visit: Payer: Self-pay | Admitting: Gastroenterology

## 2022-10-05 DIAGNOSIS — K7460 Unspecified cirrhosis of liver: Secondary | ICD-10-CM

## 2022-10-06 ENCOUNTER — Other Ambulatory Visit: Payer: Self-pay | Admitting: *Deleted

## 2022-10-06 ENCOUNTER — Other Ambulatory Visit: Payer: Self-pay | Admitting: Interventional Radiology

## 2022-10-06 ENCOUNTER — Ambulatory Visit
Admission: RE | Admit: 2022-10-06 | Discharge: 2022-10-06 | Disposition: A | Discharge status: Home / Self Care | Payer: Medicare PPO | Source: Ambulatory Visit | Attending: Interventional Radiology | Admitting: Interventional Radiology

## 2022-10-06 DIAGNOSIS — I8500 Esophageal varices without bleeding: Secondary | ICD-10-CM

## 2022-10-06 DIAGNOSIS — Z01812 Encounter for preprocedural laboratory examination: Secondary | ICD-10-CM

## 2022-10-06 DIAGNOSIS — I8501 Esophageal varices with bleeding: Secondary | ICD-10-CM

## 2022-10-06 HISTORY — PX: IR RADIOLOGIST EVAL & MGMT: IMG5224

## 2022-10-06 NOTE — Progress Notes (Signed)
Chief Complaint: Patient was seen in virtual telephone consultation today for follow up of gastroesophageal varices   Referring Physician(s): Outlaw,William   History of Present Illness: Initial HPI from 09/21/22:  Hannah Khan is a 67 y.o. female with history of NASH cirrhosis initially diagnosed in 2009.  She is graciously referred by Dr. Paulita Fujita for gastroesophageal varices visualized on recent endoscopy.  She denies any prior history of hematemesis, hematochezia, or melena.  She has a history of hemorrhoids which were treated successfully.  She has never has a paracentesis.  She reports a single episode of profound hepatic encephalopathy years ago which required hospitalization, and has been compliant with lactulose and rifaximin since then, only experiencing very mild encephalopathy if doses are missed or delayed for various reasons.  She does not drink alcohol and has no significant history of such.  She takes bumex and occasionally another medication when she feels fluid overload.     She is looking forward to an upcoming [place] cruise later this month.   CTA abdomen pelvis was obtained on 09/30/21.  She has had no change in symptoms.    Past Medical History:  Diagnosis Date   Anemia    hx of   Arthritis    knees   Diabetes mellitus without complication (HCC)    type 2   Fibromyalgia    Hx of colonic polyps    s/p partial colectomy   Hyperlipidemia    Joint pain    in knees and back spasms   Left leg swelling    wear compression hose   Liver cirrhosis secondary to NASH (nonalcoholic steatohepatitis) ([place]) dx nov 2014   had enlarged spleen also    Morbid obesity ([place])    Persistent atrial fibrillation ([place])    Pneumonia 10/2016   PONV (postoperative nausea and vomiting)    in past none recent   Portal hypertension ([place])    Sleep apnea    uses cpap setting of 14   Spleen enlarged    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 [facility])     Past Surgical History:   Procedure Laterality Date   BREAST LUMPECTOMY WITH RADIOACTIVE SEED LOCALIZATION Right 08/29/2017   Procedure: RIGHT BREAST LUMPECTOMY WITH RADIOACTIVE SEEDS LOCALIZATION;  Surgeon: Erroll Luna, MD;  Location: [place];  Service: General;  Laterality: Right;   [place]  20 yrs ago   COLECTOMY  2011   COLONOSCOPY     COLONOSCOPY N/A 09/05/2018   Procedure: COLONOSCOPY;  Surgeon: [name], MD;  [facility];  Service: Endoscopy;  Laterality: N/A;   COLONOSCOPY WITH PROPOFOL Bilateral 05/04/2022   Procedure: COLONOSCOPY WITH PROPOFOL;  Surgeon: [name], MD;  [facility];  Service: Gastroenterology;  Laterality: Bilateral;   DILATATION & CURETTAGE/HYSTEROSCOPY WITH MYOSURE N/A 05/06/2016   Procedure: DILATATION & CURETTAGE/HYSTEROSCOPY WITH MYOSURE WITH POLYPECTOMY;  Surgeon: Janyth Pupa, DO;  Location: [place] [facility];  Service: Gynecology;  Laterality: N/A;   ESOPHAGOGASTRODUODENOSCOPY (EGD) WITH PROPOFOL N/A 09/04/2013   Procedure: ESOPHAGOGASTRODUODENOSCOPY (EGD) WITH PROPOFOL;  Surgeon: [name], MD;  [facility];  Service: Endoscopy;  Laterality: N/A;   ESOPHAGOGASTRODUODENOSCOPY (EGD) WITH PROPOFOL Bilateral 05/04/2022   Procedure: ESOPHAGOGASTRODUODENOSCOPY (EGD) WITH PROPOFOL;  Surgeon: [name], MD;  [facility];  Service: Gastroenterology;  Laterality: Bilateral;   IR RADIOLOGIST EVAL & MGMT  09/21/2022   KNEE ARTHROSCOPY  yrs ago   bilateral, one done x 1, one done twice   POLYPECTOMY  09/05/2018  Procedure: POLYPECTOMY;  Surgeon: [name], MD;  [facility];  Service: Endoscopy;;    Allergies: Celecoxib, Shellfish allergy, Barium-containing compounds, Prednisone, Statins, Fentanyl, Ibuprofen, Tramadol, Tylenol [acetaminophen], and Zetia [ezetimibe]  Medications: Prior to Admission medications   Medication Sig Start Date End Date Taking? Authorizing Provider  albuterol  (PROVENTIL) (2.5 MG/3ML) 0.083% nebulizer solution Take 3 mLs (2.5 mg total) by nebulization every 6 (six) hours as needed for wheezing or shortness of breath. 03/22/21   Dessa Phi, DO  Bacitracin-Polymyxin B (NEOSPORIN EX) Apply 1 Application topically daily as needed (wound Treatment).    [provider]  bumetanide (BUMEX) 2 MG tablet Take 1 tablet (2 mg total) by mouth 2 (two) times daily. Patient taking differently: Take 2 mg by mouth daily. 01/12/22   Skeet Latch, MD  diltiazem (CARDIZEM CD) 180 MG 24 hr capsule Take 1 capsule (180 mg total) by mouth daily. 11/16/21   Loel Dubonnet, NP  empagliflozin (JARDIANCE) 25 MG TABS tablet Take 25 mg by mouth daily.    [provider]  lactulose (CHRONULAC) 10 GM/15ML solution Take 30 mLs by mouth 3 (three) times daily. 03/17/21   [provider]  Lancets (ONETOUCH DELICA PLUS [id number]) MISC Apply topically 3 (three) times daily. 04/10/20   [provider]  lidocaine (SALONPAS PAIN RELIEVING) 4 % Place 1 patch onto the skin daily as needed (back pain).    [provider]  metolazone (ZAROXOLYN) 5 MG tablet TAKE 1 TABLE EVERY 3 DAYS AS NEEDED FOR MORE THAN 4 POUND WEIGHT GAIN. 07/11/22   Skeet Latch, MD  metoprolol succinate (TOPROL-XL) 50 MG 24 hr tablet TAKE 3 TABLETS ONCE A DAY Take with or immediately following a meal. 07/14/22   Skeet Latch, MD  Multiple Vitamins-Minerals (PRESERVISION AREDS 2) CAPS Take 1 capsule by mouth 2 (two) times daily.    [provider]  [facility] VERIO test strip 1 each 3 (three) times daily. 04/13/20   [provider]  potassium chloride (MICRO-K) 10 MEQ CR capsule Take 40 mEq by mouth 2 (two) times daily.    [provider]  Propylene Glycol (SYSTANE BALANCE) 0.6 % SOLN Place 1 drop into both eyes 2 (two) times daily.    [provider]  Semaglutide, 1 MG/DOSE, (OZEMPIC, 1 MG/DOSE,) 4 MG/3ML SOPN Inject 1 mg into the skin  every 14 (fourteen) days. 07/14/22   Skeet Latch, MD  XIFAXAN 550 MG TABS tablet Take 550 mg by mouth 2 (two) times daily. 03/13/21   [provider]     Family History  Problem Relation Age of Onset   Heart failure Mother    Cancer Mother    Hyperlipidemia Mother    Congestive Heart Failure Mother    Stroke Mother    Lung cancer Father    Cancer Father    Cancer Brother    Hyperlipidemia Brother    Stroke Other     Social History   Socioeconomic History   Marital status: Married    Spouse name: Not on file   Number of children: Not on file   Years of education: Not on file   Highest education level: Not on file  Occupational History   Occupation: Merchant navy officer  Tobacco Use   Smoking status: Former    Packs/day: 1.00    Years: 3.00    Total pack years: 3.00    Types: Cigarettes    Quit date: 09/19/1974    Years since quitting: 47.0  Smokeless tobacco: Never  Vaping Use   Vaping Use: Never used  Substance and Sexual Activity   Alcohol use: Yes    Comment: occ   Drug use: No   Sexual activity: Not on file  Other Topics Concern   Not on file  Social History Narrative   Lives in [place] with her husband.  Instructor for early childhood development.     Social Determinants of Health   Financial Resource Strain: Not on file  Food Insecurity: Not on file  Transportation Needs: Not on file  Physical Activity: Not on file  Stress: Not on file  Social Connections: Not on file     Vital Signs: There were no vitals taken for this visit.  No physical examination was performed in lieu of virtual telephone clinic visit.   Imaging:  Chronic occlusion of the main portal vein with a few periportal collaterals.   Large esophageal varices   Large gastric varices   Large SVM --> IVC ectopic varix shunt   Large gastrorenal shunt    No recent echocardiogram (newest is from 2022).   Labs: No recent labs.  CBC: Recent Labs     11/16/21 1111  WBC 5.3  HGB 10.8*  HCT 33.6*  PLT 33*    COAGS: No results for input(s): "INR", "APTT" in the last 8760 hours.  BMP: Recent Labs    11/16/21 1111 01/26/22 1432 07/14/22 0943 09/30/22 1429  NA 142 137 142  --   K 3.5 4.0 3.5  --   CL 102 98 101  --   CO2 '23 27 27  '$ --   GLUCOSE 191* 177* 126*  --   BUN '22 22 18  '$ --   CALCIUM 9.4 8.9 9.4  --   CREATININE 1.11* 1.01* 1.01* 0.90    LIVER FUNCTION TESTS: Recent Labs    07/14/22 0943  BILITOT 4.3*  AST 35  ALT 23  ALKPHOS 112  PROT 6.4  ALBUMIN 3.6*   From Oct 2023 labs: Child Pugh B (8 points) MELD 15 (6.0% estimated 3 month mortality) FIPS = 0.78 (Overall survival predicted at 1 month 90.5%, 3 months 71.1%, and 6 months 59.7%)  Assessment and Plan: 67 year old female with history of NASH cirrhosis (Child Pugh B, MELD 15), portal hypertension, and gastroesophageal varices without history of hemorrhage. 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  She has very large gastroesophageal varices.  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  I am very surprised, based on her CT findings, that she has never experienced any evidence of upper or lower GI hemorrhage.  I am afraid that if it were to occur, it would be fatal.    We discussed her findings and overall case in detail, and I answered many questions about the natural history of her portal hypertension and procedural steps.  I believe she deserves to have her portal hypertension decompressed to significantly decrease her chances of hemorrhage.    The plan I have proposed to her is transsplenic portal vein recanalization, TIPS creation, and variceal embolization including coil embolization of the gastrorenal shunt near the confluence with the left renal vein, and  the SMV-->IVC varix, in addition to possibly providing sclerotherapy to the gastroesophageal varices.    We discussed in depth the risks with these procedures.  She is interested in proceeding.  -  plan for portal vein recanalization, TIPS creation, and variceal embolization with general anesthesia at [facility] (after 11/02/22) -she would like to have an in person clinic visit with me prior to the procedure for meet and greet, plan to arrange this on 11/02/22 -prior to in person clinic visit, please obtain CBC, CMP, INR in addition to echocardiogram    Electronically Signed: Rosanne Ashing Chrissie Dacquisto 10/06/2022, 11:00 AM   I spent a total of  90 minutes  in virtual telephone clinical consultation, greater than 50% of which was counseling/coordinating care for portal hypertension.

## 2022-10-07 ENCOUNTER — Other Ambulatory Visit (HOSPITAL_COMMUNITY): Payer: Self-pay | Admitting: Interventional Radiology

## 2022-10-12 ENCOUNTER — Other Ambulatory Visit (HOSPITAL_COMMUNITY): Payer: Self-pay | Admitting: Interventional Radiology

## 2022-10-12 DIAGNOSIS — I8501 Esophageal varices with bleeding: Secondary | ICD-10-CM

## 2022-10-14 ENCOUNTER — Ambulatory Visit (HOSPITAL_BASED_OUTPATIENT_CLINIC_OR_DEPARTMENT_OTHER): Payer: Medicare PPO | Admitting: Family

## 2022-10-20 ENCOUNTER — Other Ambulatory Visit (HOSPITAL_BASED_OUTPATIENT_CLINIC_OR_DEPARTMENT_OTHER): Payer: Self-pay | Admitting: Family

## 2022-10-20 NOTE — Telephone Encounter (Signed)
Rx(s) sent to pharmacy electronically.  

## 2022-10-24 ENCOUNTER — Other Ambulatory Visit: Payer: Self-pay | Admitting: Interventional Radiology

## 2022-10-24 DIAGNOSIS — R051 Acute cough: Secondary | ICD-10-CM | POA: Diagnosis not present

## 2022-10-24 DIAGNOSIS — K766 Portal hypertension: Secondary | ICD-10-CM

## 2022-10-24 DIAGNOSIS — J069 Acute upper respiratory infection, unspecified: Secondary | ICD-10-CM | POA: Diagnosis not present

## 2022-10-24 DIAGNOSIS — E119 Type 2 diabetes mellitus without complications: Secondary | ICD-10-CM | POA: Diagnosis not present

## 2022-10-24 DIAGNOSIS — I4821 Permanent atrial fibrillation: Secondary | ICD-10-CM | POA: Diagnosis not present

## 2022-10-24 DIAGNOSIS — R6883 Chills (without fever): Secondary | ICD-10-CM | POA: Diagnosis not present

## 2022-10-24 DIAGNOSIS — D696 Thrombocytopenia, unspecified: Secondary | ICD-10-CM | POA: Diagnosis not present

## 2022-10-24 DIAGNOSIS — I8501 Esophageal varices with bleeding: Secondary | ICD-10-CM

## 2022-10-24 DIAGNOSIS — I5032 Chronic diastolic (congestive) heart failure: Secondary | ICD-10-CM | POA: Diagnosis not present

## 2022-10-24 DIAGNOSIS — I8500 Esophageal varices without bleeding: Secondary | ICD-10-CM

## 2022-10-31 DIAGNOSIS — I8500 Esophageal varices without bleeding: Secondary | ICD-10-CM | POA: Diagnosis not present

## 2022-11-01 ENCOUNTER — Ambulatory Visit (HOSPITAL_COMMUNITY): Payer: Medicare PPO | Attending: Interventional Radiology

## 2022-11-01 DIAGNOSIS — Z01810 Encounter for preprocedural cardiovascular examination: Secondary | ICD-10-CM | POA: Diagnosis not present

## 2022-11-01 DIAGNOSIS — Z01812 Encounter for preprocedural laboratory examination: Secondary | ICD-10-CM | POA: Diagnosis not present

## 2022-11-01 LAB — ECHOCARDIOGRAM COMPLETE
Area-P 1/2: 3.4 cm2
S' Lateral: 3.5 cm

## 2022-11-01 MED ORDER — PERFLUTREN LIPID MICROSPHERE
1.0000 mL | INTRAVENOUS | Status: AC | PRN
  Administered 2022-11-01: 3 mL via INTRAVENOUS

## 2022-11-02 ENCOUNTER — Ambulatory Visit
Admission: RE | Admit: 2022-11-02 | Discharge: 2022-11-02 | Disposition: A | Discharge status: Home / Self Care | Payer: Medicare PPO | Source: Ambulatory Visit | Attending: Interventional Radiology | Admitting: Interventional Radiology

## 2022-11-02 DIAGNOSIS — I8500 Esophageal varices without bleeding: Secondary | ICD-10-CM | POA: Diagnosis not present

## 2022-11-02 DIAGNOSIS — K766 Portal hypertension: Secondary | ICD-10-CM | POA: Diagnosis not present

## 2022-11-02 HISTORY — PX: IR RADIOLOGIST EVAL & MGMT: IMG5224

## 2022-11-02 NOTE — Progress Notes (Signed)
Chief Complaint: Patient was seen for follow up of gastroesophageal varices   Referring Physician(s): Outlaw,William   History of Present Illness: Initial HPI from 09/21/22:  Hannah Khan is a 67 y.o. female with history of NASH cirrhosis initially diagnosed in 2009.  She is graciously referred by Dr. Paulita Fujita for gastroesophageal varices visualized on recent endoscopy.  She denies any prior history of hematemesis, hematochezia, or melena.  She has a history of hemorrhoids which were treated successfully.  She has never has a paracentesis.  She reports a single episode of profound hepatic encephalopathy years ago which required hospitalization, and has been compliant with lactulose and rifaximin since then, only experiencing very mild encephalopathy if doses are missed or delayed for various reasons.  She does not drink alcohol and has no significant history of such.  She takes bumex and occasionally another medication when she feels fluid overload.     CTA abdomen pelvis was obtained on 09/30/21.  She has had no change in symptoms.  She reports having outpatient labs drawn which our clinic is working to obtain.   Past Medical History:  Diagnosis Date   Anemia    hx of   Arthritis    knees   Diabetes mellitus without complication (HCC)    type 2   Fibromyalgia    Hx of colonic polyps    s/p partial colectomy   Hyperlipidemia    Joint pain    in knees and back spasms   Left leg swelling    wear compression hose   Liver cirrhosis secondary to NASH (nonalcoholic steatohepatitis) ([place]) dx nov 2014   had enlarged spleen also    Morbid obesity ([place])    Persistent atrial fibrillation ([place])    Pneumonia 10/2016   PONV (postoperative nausea and vomiting)    in past none recent   Portal hypertension ([place])    Sleep apnea    uses cpap setting of 14   Spleen enlarged    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 [facility])     Past Surgical History:  Procedure Laterality Date   BREAST  LUMPECTOMY WITH RADIOACTIVE SEED LOCALIZATION Right 08/29/2017   Procedure: RIGHT BREAST LUMPECTOMY WITH RADIOACTIVE SEEDS LOCALIZATION;  Surgeon: Erroll Luna, MD;  Location: [place];  Service: General;  Laterality: Right;   [place]  20 yrs ago   COLECTOMY  2011   COLONOSCOPY     COLONOSCOPY N/A 09/05/2018   Procedure: COLONOSCOPY;  Surgeon: [name], MD;  Location: [facility];  Service: Endoscopy;  Laterality: N/A;   COLONOSCOPY WITH PROPOFOL Bilateral 05/04/2022   Procedure: COLONOSCOPY WITH PROPOFOL;  Surgeon: [name], MD;  Location: [facility];  Service: Gastroenterology;  Laterality: Bilateral;   DILATATION & CURETTAGE/HYSTEROSCOPY WITH MYOSURE N/A 05/06/2016   Procedure: DILATATION & CURETTAGE/HYSTEROSCOPY WITH MYOSURE WITH POLYPECTOMY;  Surgeon: Janyth Pupa, DO;  Location: [place] [facility];  Service: Gynecology;  Laterality: N/A;   ESOPHAGOGASTRODUODENOSCOPY (EGD) WITH PROPOFOL N/A 09/04/2013   Procedure: ESOPHAGOGASTRODUODENOSCOPY (EGD) WITH PROPOFOL;  Surgeon: [name], MD;  Location: [facility];  Service: Endoscopy;  Laterality: N/A;   ESOPHAGOGASTRODUODENOSCOPY (EGD) WITH PROPOFOL Bilateral 05/04/2022   Procedure: ESOPHAGOGASTRODUODENOSCOPY (EGD) WITH PROPOFOL;  Surgeon: [name], MD;  Location: [facility];  Service: Gastroenterology;  Laterality: Bilateral;   IR RADIOLOGIST EVAL & MGMT  09/21/2022   IR RADIOLOGIST EVAL & MGMT  10/06/2022   KNEE ARTHROSCOPY  yrs ago   bilateral, one done x 1, one done twice  POLYPECTOMY  09/05/2018   Procedure: POLYPECTOMY;  Surgeon: [name], MD;  Location: [facility];  Service: Endoscopy;;    Allergies: Celecoxib, Shellfish allergy, Barium-containing compounds, Prednisone, Statins, Fentanyl, Ibuprofen, Tramadol, Tylenol [acetaminophen], and Zetia [ezetimibe]  Medications: Prior to Admission medications   Medication Sig Start Date End Date Taking? Authorizing Provider   albuterol (PROVENTIL) (2.5 MG/3ML) 0.083% nebulizer solution Take 3 mLs (2.5 mg total) by nebulization every 6 (six) hours as needed for wheezing or shortness of breath. 03/22/21   Dessa Phi, DO  Bacitracin-Polymyxin B (NEOSPORIN EX) Apply 1 Application topically daily as needed (wound Treatment).    [provider]  bumetanide (BUMEX) 2 MG tablet Take 1 tablet (2 mg total) by mouth 2 (two) times daily. Patient taking differently: Take 2 mg by mouth daily. 01/12/22   Skeet Latch, MD  diltiazem (CARDIZEM CD) 180 MG 24 hr capsule TAKE 1 CAPSULE BY MOUTH EVERY DAY 10/20/22   Loel Dubonnet, NP  empagliflozin (JARDIANCE) 25 MG TABS tablet Take 25 mg by mouth daily.    [provider]  lactulose (CHRONULAC) 10 GM/15ML solution Take 30 mLs by mouth 3 (three) times daily. 03/17/21   [provider]  Lancets (ONETOUCH DELICA PLUS 123XX123) MISC Apply topically 3 (three) times daily. 04/10/20   [provider]  lidocaine (SALONPAS PAIN RELIEVING) 4 % Place 1 patch onto the skin daily as needed (back pain).    [provider]  metolazone (ZAROXOLYN) 5 MG tablet TAKE 1 TABLE EVERY 3 DAYS AS NEEDED FOR MORE THAN 4 POUND WEIGHT GAIN. 07/11/22   Skeet Latch, MD  metoprolol succinate (TOPROL-XL) 50 MG 24 hr tablet TAKE 3 TABLETS ONCE A DAY Take with or immediately following a meal. 07/14/22   Skeet Latch, MD  Multiple Vitamins-Minerals (PRESERVISION AREDS 2) CAPS Take 1 capsule by mouth 2 (two) times daily.    [provider]  [facility] VERIO test strip 1 each 3 (three) times daily. 04/13/20   [provider]  potassium chloride (MICRO-K) 10 MEQ CR capsule Take 40 mEq by mouth 2 (two) times daily.    [provider]  Propylene Glycol (SYSTANE BALANCE) 0.6 % SOLN Place 1 drop into both eyes 2 (two) times daily.    [provider]  Semaglutide, 1 MG/DOSE, (OZEMPIC, 1 MG/DOSE,) 4 MG/3ML SOPN Inject 1 mg into the skin  every 14 (fourteen) days. 07/14/22   Skeet Latch, MD  XIFAXAN 550 MG TABS tablet Take 550 mg by mouth 2 (two) times daily. 03/13/21   [provider]     Family History  Problem Relation Age of Onset   Heart failure Mother    Cancer Mother    Hyperlipidemia Mother    Congestive Heart Failure Mother    Stroke Mother    Lung cancer Father    Cancer Father    Cancer Brother    Hyperlipidemia Brother    Stroke Other     Social History   Socioeconomic History   Marital status: Married    Spouse name: Not on file   Number of children: Not on file   Years of education: Not on file   Highest education level: Not on file  Occupational History   Occupation: Merchant navy officer  Tobacco Use   Smoking status: Former    Packs/day: 1.00    Years: 3.00    Total pack years: 3.00    Types: Cigarettes    Quit date: 09/19/1974    Years since quitting:  48.1   Smokeless tobacco: Never  Vaping Use   Vaping Use: Never used  Substance and Sexual Activity   Alcohol use: Yes    Comment: occ   Drug use: No   Sexual activity: Not on file  Other Topics Concern   Not on file  Social History Narrative   Lives in [place] with her husband.  Instructor for early childhood development.     Social Determinants of Health   Financial Resource Strain: Not on file  Food Insecurity: Not on file  Transportation Needs: Not on file  Physical Activity: Not on file  Stress: Not on file  Social Connections: Not on file     Vital Signs: BP 131/71 (BP Location: Left Arm, Patient Position: Sitting, Cuff Size: Large)   Pulse 98   Temp 97.6 F (36.4 C) (Oral)   Wt (!) 153.8 kg   SpO2 96% Comment: room air  BMI 53.09 kg/m   Physical Exam Constitutional:      General: She is not in acute distress. HENT:     Head: Normocephalic.     Mouth/Throat:     Mouth: Mucous membranes are moist.  Eyes:     General: No scleral icterus. Cardiovascular:     Rate and Rhythm: Normal rate and  regular rhythm.  Pulmonary:     Effort: Pulmonary effort is normal.  Abdominal:     General: There is no distension.     Comments: Obese  Musculoskeletal:     Right lower leg: No edema.     Left lower leg: No edema.  Skin:    General: Skin is warm and dry.     Coloration: Skin is not jaundiced.  Neurological:     Mental Status: She is alert and oriented to person, place, and time.     Imaging:  Chronic occlusion of the main portal vein with a few periportal collaterals.    Large esophageal varices    Large gastric varices    Large SVM --> IVC ectopic varix shunt    Large gastrorenal shunt  Echocardiogram: IMPRESSIONS   1. Left ventricular ejection fraction, by estimation, is 60 to 65%. The  left ventricle has normal function. The left ventricle has no regional  wall motion abnormalities. Left ventricular diastolic function could not  be evaluated.   2. Right ventricular systolic function is normal. The right ventricular  size is normal. There is normal pulmonary artery systolic pressure.   3. Left atrial size was mildly dilated.   4. The mitral valve is normal in structure. Trivial mitral valve  regurgitation. No evidence of mitral stenosis.   5. The aortic valve was not well visualized. Aortic valve regurgitation  is not visualized. No aortic stenosis is present.   6. The inferior vena cava is normal in size with greater than 50%  respiratory variability, suggesting right atrial pressure of 3 mmHg.     Labs:  CBC: Recent Labs    11/16/21 1111  WBC 5.3  HGB 10.8*  HCT 33.6*  PLT 33*    COAGS: No results for input(s): "INR", "APTT" in the last 8760 hours.  BMP: Recent Labs    11/16/21 1111 01/26/22 1432 07/14/22 0943 09/30/22 1429  NA 142 137 142  --   K 3.5 4.0 3.5  --   CL 102 98 101  --   CO2 23 27 27  $ --   GLUCOSE 191* 177* 126*  --   BUN 22 22 18  $ --  CALCIUM 9.4 8.9 9.4  --   CREATININE 1.11* 1.01* 1.01* 0.90    LIVER FUNCTION  TESTS: Recent Labs    07/14/22 0943  BILITOT 4.3*  AST 35  ALT 23  ALKPHOS 112  PROT 6.4  ALBUMIN 3.6*    From Oct 2023 labs: Child Pugh B (8 points) MELD 15 (6.0% estimated 3 month mortality) FIPS = 0.78 (Overall survival predicted at 1 month 90.5%, 3 months 71.1%, and 6 months 59.7%)    Assessment and Plan: 67 year old female with history of NASH cirrhosis (Child Pugh B, MELD 15), portal hypertension, and gastroesophageal varices without history of hemorrhage. 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  She has very large gastroesophageal varices.  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   I am very surprised, based on her CT findings, that she has never experienced any evidence of upper or lower GI hemorrhage.  I am afraid that if it were to occur, it would be fatal.     Again, we discussed her findings and overall case in detail, and I answered many questions about the natural history of her portal hypertension and procedural steps.  I believe she deserves to have her portal hypertension decompressed to significantly decrease her chances of hemorrhage.     The plan I have proposed to her is TIPS creation, possible transsplenic portal vein recanalization, and variceal embolization including coil embolization of the gastrorenal shunt near the confluence with the left renal vein, and the SMV-->IVC varix, in addition to possibly providing sclerotherapy to the gastroesophageal varices.     We discussed in depth the risks with these procedures.  She is interested in proceeding.   Plan for TIPS creation,  portal vein recanalization, and variceal embolization with general anesthesia at [facility].   Electronically Signed: Suzette Battiest 11/02/2022, 12:10 PM   I spent a total of 40  Minutes in face to face in clinical consultation, greater than 50% of which was counseling/coordinating care for portal hypertension.

## 2022-11-03 ENCOUNTER — Encounter (HOSPITAL_BASED_OUTPATIENT_CLINIC_OR_DEPARTMENT_OTHER): Payer: Self-pay | Admitting: Family

## 2022-11-03 ENCOUNTER — Ambulatory Visit (HOSPITAL_BASED_OUTPATIENT_CLINIC_OR_DEPARTMENT_OTHER): Payer: Medicare PPO | Admitting: Family

## 2022-11-03 VITALS — BP 120/80 | HR 97 | Ht 67.0 in | Wt 357.0 lb

## 2022-11-03 DIAGNOSIS — Z01810 Encounter for preprocedural cardiovascular examination: Secondary | ICD-10-CM

## 2022-11-03 DIAGNOSIS — I4821 Permanent atrial fibrillation: Secondary | ICD-10-CM | POA: Diagnosis not present

## 2022-11-03 DIAGNOSIS — I5032 Chronic diastolic (congestive) heart failure: Secondary | ICD-10-CM | POA: Diagnosis not present

## 2022-11-03 DIAGNOSIS — R052 Subacute cough: Secondary | ICD-10-CM

## 2022-11-03 DIAGNOSIS — Z8619 Personal history of other infectious and parasitic diseases: Secondary | ICD-10-CM

## 2022-11-03 MED ORDER — OZEMPIC (2 MG/DOSE) 8 MG/3ML ~~LOC~~ SOPN: 2.0000 mg | PEN_INJECTOR | SUBCUTANEOUS | 6 refills | Status: DC

## 2022-11-03 MED ORDER — BENZONATATE 100 MG PO CAPS: 100.0000 mg | ORAL_CAPSULE | Freq: Three times a day (TID) | ORAL | 1 refills | Status: DC | PRN

## 2022-11-03 NOTE — Patient Instructions (Addendum)
Medication Instructions:  Your physician recommends that you return for lab work in:    Recommend taking your Metolazone tomorrow 30 minutes prior to your Bumex to help get rid of fluid.  INCREASE your Ozempic to 24m once per week  You will need to hold your Ozempic 1 week prior to your surgery.   *If you need a refill on your cardiac medications before your next appointment, please call your pharmacy*   Lab Work/Testing/Procedures: Your EKG today showed rate controlled atrial fibrillation.    Follow-Up: [facility].  As part of our continuing mission to provide you with exceptional heart care, we have created designated Provider Care Teams.  These Care Teams include your primary Cardiologist (physician) and Advanced Practice Providers (APPs -  Physician Assistants and Nurse Practitioners) who all work together to provide you with the care you need, when you need it.  We recommend signing up for the patient portal called "MyChart".  Sign up information is provided on this After Visit Summary.  MyChart is used to connect with patients for Virtual Visits (Telemedicine).  Patients are able to view lab/test results, encounter notes, upcoming appointments, etc.  Non-urgent messages can be sent to your provider as well.   To learn more about what you can do with MyChart, go to [email or web address]    Your next appointment:   1 month(s)  Provider:   TSkeet Latch MD or CLaurann Montana NP    Other Instructions  Recommend weighing daily and keeping a log. Please call our office if you have weight gain of 2 pounds overnight or 5 pounds in 1 week.   Date  Time Weight

## 2022-11-03 NOTE — Progress Notes (Signed)
Office Visit    Patient Name: Hannah Khan Date of Encounter: 11/04/2022  PCP:  Lujean Amel, [place]  Cardiologist:  Skeet Latch, MD  Advanced Practice Provider:  No care team member to display Electrophysiologist:  None   Chief Complaint    Hannah Khan is a 67 y.o. female presents today for heart failure follow up.  Past Medical History    Past Medical History:  Diagnosis Date   Anemia    hx of   Arthritis    knees   Diabetes mellitus without complication (HCC)    type 2   Fibromyalgia    Hx of colonic polyps    s/p partial colectomy   Hyperlipidemia    Joint pain    in knees and back spasms   Left leg swelling    wear compression hose   Liver cirrhosis secondary to NASH (nonalcoholic steatohepatitis) ([place]) dx nov 2014   had enlarged spleen also    Morbid obesity ([place])    Persistent atrial fibrillation ([place])    Pneumonia 10/2016   PONV (postoperative nausea and vomiting)    in past none recent   Portal hypertension ([place])    Sleep apnea    uses cpap setting of 14   Spleen enlarged    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 [facility])    Past Surgical History:  Procedure Laterality Date   BREAST LUMPECTOMY WITH RADIOACTIVE SEED LOCALIZATION Right 08/29/2017   Procedure: RIGHT BREAST LUMPECTOMY WITH RADIOACTIVE SEEDS LOCALIZATION;  Surgeon: Erroll Luna, MD;  Location: [facility];  Service: General;  Laterality: Right;   [place]  20 yrs ago   COLECTOMY  2011   COLONOSCOPY     COLONOSCOPY N/A 09/05/2018   Procedure: COLONOSCOPY;  Surgeon: [name], MD;  Location: [facility];  Service: Endoscopy;  Laterality: N/A;   COLONOSCOPY WITH PROPOFOL Bilateral 05/04/2022   Procedure: COLONOSCOPY WITH PROPOFOL;  Surgeon: [name], MD;  Location: [facility];  Service: Gastroenterology;  Laterality: Bilateral;   DILATATION & CURETTAGE/HYSTEROSCOPY WITH MYOSURE N/A 05/06/2016    Procedure: DILATATION & CURETTAGE/HYSTEROSCOPY WITH MYOSURE WITH POLYPECTOMY;  Surgeon: Janyth Pupa, DO;  Location: [facility];  Service: Gynecology;  Laterality: N/A;   ESOPHAGOGASTRODUODENOSCOPY (EGD) WITH PROPOFOL N/A 09/04/2013   Procedure: ESOPHAGOGASTRODUODENOSCOPY (EGD) WITH PROPOFOL;  Surgeon: [name], MD;  [facility];  Service: Endoscopy;  Laterality: N/A;   ESOPHAGOGASTRODUODENOSCOPY (EGD) WITH PROPOFOL Bilateral 05/04/2022   Procedure: ESOPHAGOGASTRODUODENOSCOPY (EGD) WITH PROPOFOL;  Surgeon: [name], MD;  [facility];  Service: Gastroenterology;  Laterality: Bilateral;   IR RADIOLOGIST EVAL & MGMT  09/21/2022   IR RADIOLOGIST EVAL & MGMT  10/06/2022   IR RADIOLOGIST EVAL & MGMT  11/02/2022   KNEE ARTHROSCOPY  yrs ago   bilateral, one done x 1, one done twice   POLYPECTOMY  09/05/2018   Procedure: POLYPECTOMY;  Surgeon: [name], MD;  [facility];  Service: Endoscopy;;    Allergies  Allergies  Allergen Reactions   Celecoxib Other (See Comments)    "speech slurred" with celebrex   Shellfish Allergy Swelling and Rash    TINGLING AND SWELLING OF LIPS. LARGE WHELPS QUARTER-SIZE   Barium-Containing Compounds Other (See Comments)    TACHYCARDIA   Prednisone Other (See Comments)    TACHYCARDIA   Statins Other (See Comments)    AGGRAVATED FIBROMYALGIA   Fentanyl     Hallucinations    Ibuprofen Other (See  Comments)    UNSPECIFIED SPECIFIC REACTION >> "DUE TO PLATELETS"   Tramadol     Hallucination    Tylenol [Acetaminophen] Other (See Comments)    UNSPECIFIED SPECIFIC REACTION >> "DUE TO PLATELETS"   Zetia [Ezetimibe]     Headache    History of Present Illness    Hannah Khan is a 67 y.o. female with a hx of chronic diastolic heart failure, morbid obesity, OSA, NASH/cirrhosis, chronic thrombocytopenia, DM2, vasovagal presyncope, chronic atrial fibrillation not on anticoagulation last seen 07/14/22.  ED visit 02/2020  with atrial fibrillation requiring diltiazem gtt. At clinic follow up July 2021 noted dyspnea and fluid retention, Bumex increased for short time. At follow up 04/2020 weight and edema were improved.  She saw Dr. Lovena Le 07/2020 and decided upon medical management as she was overall asymptomatic.   Admitted 01/2021 with atrial fibrillation and hepatic encephalopathy. Seen in follow up 02/17/21 doing well. Admitted 03/17/21-03/22/21 with fever and hypoxia treated for community acquired pneumonia. Retired July 1st, 2022 from [place] working as a [employer] program. Readmitted 03/2021 with acute on chronic diastolic heart failure suspected due to dietary indiscretion. Wilder Glade was started. Bumex 76m daily continued with Metolazone PRN for weight gain. She did have episode of vasovagal presyncope associated with IV magnesium with CT head and neurology consult unremarkable. She saw Dr. ROval Linsey9/26/22 - Diltaizem reduced due to hypotension and Metoprolol increased to 770mBID. Spironolactone previously trialed but had to be discontinued due to hypotension.  Her diltiazem has been gradually reduced due to hypotension.  Bumex and metolazone have been escalated.    She last saw Dr. RaOval Linsey0/26/23 with dizziness more attributed to multiple vertigo given information on Epley maneuver.  She was a bit volume overloaded as she had not been taking her Bumex due to travel.  She was encouraged to resume her previous regimen.  Presents today for follow up independently. Has RSV earlier in the month. Still with some congestion and cough. Used Muccinex and Tessalon Pearles.  Notes she is having TIPS procedure in March. They had to do blood work this week and had difficulty finding a vein so she stopped her diuretics for 9 days and also increased her oral hydration. Her weight subsequently has increased. Echocardiogram 11/01/22 as part of preop workup with LVEF 60-65%, no RWMA, LA mildly dilated, no significant valvular  abnormalities.  EKGs/Labs/Other Studies Reviewed:   The following studies were reviewed today:   Echocardiogram 03/20/2021:  1. Technically difficult; normal LV function; enlarged right heart.   2. Left ventricular ejection fraction, by estimation, is 60 to 65%. The  left ventricle has normal function. The left ventricle has no regional  wall motion abnormalities. There is mild left ventricular hypertrophy.  Left ventricular diastolic parameters  are indeterminate.   3. Right ventricular systolic function is normal. The right ventricular  size is moderately enlarged. There is normal pulmonary artery systolic  pressure.   4. Left atrial size was mildly dilated.   5. Right atrial size was severely dilated.   6. Large pleural effusion.   7. The mitral valve is normal in structure. Trivial mitral valve  regurgitation. No evidence of mitral stenosis.   8. The aortic valve is calcified. Aortic valve regurgitation is not  visualized. No aortic stenosis is present.   9. Aortic dilatation noted. There is borderline dilatation of the  ascending aorta, measuring 38 mm.  10. The inferior vena cava is dilated in size with >50% respiratory  variability, suggesting right atrial pressure of 8 mmHg.  EKG:  EKG ordered today. EKG performed today revealed rate controlled atrial fibrillation 97 bpm with stable iRBBB.  Recent Labs: 11/16/2021: Hemoglobin 10.8; Platelets 33 07/14/2022: ALT 23; BUN 18; Potassium 3.5; Sodium 142 09/30/2022: Creatinine, Ser 0.90  Recent Lipid Panel    Component Value Date/Time   CHOL 167 07/14/2022 0943   TRIG 77 07/14/2022 0943   HDL 84 07/14/2022 0943   CHOLHDL 2.0 07/14/2022 0943   CHOLHDL 3.0 07/02/2014 1138   VLDL 19 07/02/2014 1138   LDLCALC 69 07/14/2022 0943    Home Medications   Current Meds  Medication Sig   albuterol (PROVENTIL) (2.5 MG/3ML) 0.083% nebulizer solution Take 3 mLs (2.5 mg total) by nebulization every 6 (six) hours as needed for wheezing  or shortness of breath.   Bacitracin-Polymyxin B (NEOSPORIN EX) Apply 1 Application topically daily as needed (wound Treatment).   bumetanide (BUMEX) 2 MG tablet Take 1 tablet (2 mg total) by mouth 2 (two) times daily. (Patient taking differently: Take 2 mg by mouth daily.)   diltiazem (CARDIZEM CD) 180 MG 24 hr capsule TAKE 1 CAPSULE BY MOUTH EVERY DAY   empagliflozin (JARDIANCE) 25 MG TABS tablet Take 25 mg by mouth daily.   lactulose (CHRONULAC) 10 GM/15ML solution Take 30 mLs by mouth 3 (three) times daily.   Lancets (ONETOUCH DELICA PLUS 123XX123) MISC Apply topically 3 (three) times daily.   lidocaine (SALONPAS PAIN RELIEVING) 4 % Place 1 patch onto the skin daily as needed (back pain).   metolazone (ZAROXOLYN) 5 MG tablet TAKE 1 TABLE EVERY 3 DAYS AS NEEDED FOR MORE THAN 4 POUND WEIGHT GAIN.   metoprolol succinate (TOPROL-XL) 50 MG 24 hr tablet TAKE 3 TABLETS ONCE A DAY Take with or immediately following a meal.   Multiple Vitamins-Minerals (PRESERVISION AREDS 2) CAPS Take 1 capsule by mouth 2 (two) times daily.   ONETOUCH VERIO test strip 1 each 3 (three) times daily.   potassium chloride (MICRO-K) 10 MEQ CR capsule Take 40 mEq by mouth 2 (two) times daily.   Propylene Glycol (SYSTANE BALANCE) 0.6 % SOLN Place 1 drop into both eyes 2 (two) times daily.   XIFAXAN 550 MG TABS tablet Take 550 mg by mouth 2 (two) times daily.   [DISCONTINUED] benzonatate (TESSALON PERLES) 100 MG capsule Take 1 capsule (100 mg total) by mouth 3 (three) times daily as needed for cough.   [DISCONTINUED] Semaglutide, 1 MG/DOSE, (OZEMPIC, 1 MG/DOSE,) 4 MG/3ML SOPN Inject 1 mg into the skin every 14 (fourteen) days.   [DISCONTINUED] Semaglutide, 2 MG/DOSE, (OZEMPIC, 2 MG/DOSE,) 8 MG/3ML SOPN Inject 2 mg into the skin once a week.     Review of Systems      All other systems reviewed and are otherwise negative except as noted above.  Physical Exam    VS:  BP 120/80   Pulse 97   Ht 5' 7"$  (1.702 m)   Wt (!)  357 lb (161.9 kg)   BMI 55.91 kg/m  , BMI Body mass index is 55.91 kg/m.  Wt Readings from Last 3 Encounters:  11/03/22 (!) 357 lb (161.9 kg)  11/02/22 (!) 339 lb (153.8 kg)  07/14/22 (!) 339 lb 14.4 oz (154.2 kg)    GEN: Well nourished, overweight, well developed, in no acute distress. HEENT: normal. Neck: Supple, no JVD, carotid bruits, or masses. Cardiac: IRIR, gr 2/6 systolic murmur, no  rubs or gallops. No clubbing, cyanosis, edema.  Radials/PT 2+ and  equal bilaterally.  Respiratory:  Respirations regular and unlabored, clear to auscultation bilaterally. GI: Soft, nontender, nondistended. MS: No deformity or atrophy. Skin: Warm and dry, no rash. Neuro:  Strength and sensation are intact. Psych: Normal affect.  Assessment & Plan    Preop clearance - Upcoming TIPS with Dr. Serafina Royals. According to the Revised Cardiac Risk Index (RCRI), her Perioperative Risk of Major Cardiac Event is (%): 0.9. Her Functional Capacity in METs is: 4.73 according to the [place] Activity Status Index (DASI). Per AHA/ACC guidelines, she is deemed acceptable risk for the planned procedure without additional cardiovascular testing. Will route to surgical team so they are aware.    Hx of RSV - Residual cough. Rx Tessalon pearles PRN for cough  Chronic diastolic heart failure -weight is up 18 pounds compared to clinic visit 4 months ago.  She self discontinued her diuretic 9 days ago she needed lab work and wanted to be hydrated.  We discussed that she needs to take her metolazone and Bumex regularly as prescribed.  She will take metolazone tomorrow prior to her Bumex and again on Sunday. Low sodium diet, fluid restriction <2L, and daily weights encouraged. Educated to contact our office for weight gain of 2 lbs overnight or 5 lbs in one week.   Persistent atrial fibrillation - Previously intolerant to digoxin. No anticoagulation due to high bleed risk and cirrhosis.Not candidate for [facility]. Continue Toprol 119m  QD and diltiazem 180 daily  OSA - CPAP compliance encouraged.   Thrombocytopenia - Follows with Dr. SAlen Blewof hematology.   Morbid obesity - Weight loss via diet and exercise encouraged. Discussed the impact being overweight would have on cardiovascular risk. On Ozempic per endocrinology. Previously declined referral to PREP exercise program today.   DM2 - Continue to follow with PCP. Continue Jardiance for cardioprotective and heart failure benefit.   Liver cirrhosis secondary to nonalcoholic steato hepatitis-  Continue to follow with GI and PCP. TIPS upcoming.   Disposition: Follow up in 1 month with Dr. ROval Linsey  Signed, CLoel Dubonnet NP 11/04/2022, 12:58 PM [place]

## 2022-11-04 ENCOUNTER — Encounter (HOSPITAL_BASED_OUTPATIENT_CLINIC_OR_DEPARTMENT_OTHER): Payer: Self-pay | Admitting: Family

## 2022-11-12 ENCOUNTER — Emergency Department (HOSPITAL_COMMUNITY): Payer: Medicare PPO

## 2022-11-12 ENCOUNTER — Other Ambulatory Visit: Payer: Self-pay

## 2022-11-12 ENCOUNTER — Inpatient Hospital Stay (HOSPITAL_COMMUNITY)
Admission: EM | Admit: 2022-11-12 | Discharge: 2022-11-15 | DRG: 872 | Disposition: A | Discharge status: Home Health | Payer: Medicare PPO | Attending: Family Medicine | Admitting: Family Medicine

## 2022-11-12 DIAGNOSIS — R42 Dizziness and giddiness: Secondary | ICD-10-CM | POA: Diagnosis not present

## 2022-11-12 DIAGNOSIS — Z91013 Allergy to seafood: Secondary | ICD-10-CM

## 2022-11-12 DIAGNOSIS — I444 Left anterior fascicular block: Secondary | ICD-10-CM | POA: Diagnosis present

## 2022-11-12 DIAGNOSIS — G4733 Obstructive sleep apnea (adult) (pediatric): Secondary | ICD-10-CM | POA: Diagnosis present

## 2022-11-12 DIAGNOSIS — Z823 Family history of stroke: Secondary | ICD-10-CM

## 2022-11-12 DIAGNOSIS — L03311 Cellulitis of abdominal wall: Secondary | ICD-10-CM | POA: Diagnosis present

## 2022-11-12 DIAGNOSIS — I4819 Other persistent atrial fibrillation: Secondary | ICD-10-CM | POA: Diagnosis not present

## 2022-11-12 DIAGNOSIS — I4891 Unspecified atrial fibrillation: Secondary | ICD-10-CM | POA: Diagnosis not present

## 2022-11-12 DIAGNOSIS — M542 Cervicalgia: Secondary | ICD-10-CM | POA: Diagnosis present

## 2022-11-12 DIAGNOSIS — Z7985 Long-term (current) use of injectable non-insulin antidiabetic drugs: Secondary | ICD-10-CM

## 2022-11-12 DIAGNOSIS — D6959 Other secondary thrombocytopenia: Secondary | ICD-10-CM | POA: Diagnosis present

## 2022-11-12 DIAGNOSIS — Z9049 Acquired absence of other specified parts of digestive tract: Secondary | ICD-10-CM

## 2022-11-12 DIAGNOSIS — Z6841 Body Mass Index (BMI) 40.0 and over, adult: Secondary | ICD-10-CM | POA: Diagnosis not present

## 2022-11-12 DIAGNOSIS — J811 Chronic pulmonary edema: Secondary | ICD-10-CM | POA: Diagnosis not present

## 2022-11-12 DIAGNOSIS — M170 Bilateral primary osteoarthritis of knee: Secondary | ICD-10-CM | POA: Diagnosis present

## 2022-11-12 DIAGNOSIS — E119 Type 2 diabetes mellitus without complications: Secondary | ICD-10-CM | POA: Diagnosis present

## 2022-11-12 DIAGNOSIS — Z885 Allergy status to narcotic agent status: Secondary | ICD-10-CM | POA: Diagnosis not present

## 2022-11-12 DIAGNOSIS — E785 Hyperlipidemia, unspecified: Secondary | ICD-10-CM | POA: Diagnosis present

## 2022-11-12 DIAGNOSIS — R0602 Shortness of breath: Secondary | ICD-10-CM | POA: Diagnosis not present

## 2022-11-12 DIAGNOSIS — M797 Fibromyalgia: Secondary | ICD-10-CM | POA: Diagnosis present

## 2022-11-12 DIAGNOSIS — Z8249 Family history of ischemic heart disease and other diseases of the circulatory system: Secondary | ICD-10-CM | POA: Diagnosis not present

## 2022-11-12 DIAGNOSIS — R0989 Other specified symptoms and signs involving the circulatory and respiratory systems: Secondary | ICD-10-CM | POA: Diagnosis present

## 2022-11-12 DIAGNOSIS — Z83438 Family history of other disorder of lipoprotein metabolism and other lipidemia: Secondary | ICD-10-CM

## 2022-11-12 DIAGNOSIS — K7460 Unspecified cirrhosis of liver: Secondary | ICD-10-CM | POA: Diagnosis present

## 2022-11-12 DIAGNOSIS — I499 Cardiac arrhythmia, unspecified: Secondary | ICD-10-CM | POA: Diagnosis not present

## 2022-11-12 DIAGNOSIS — Z7984 Long term (current) use of oral hypoglycemic drugs: Secondary | ICD-10-CM

## 2022-11-12 DIAGNOSIS — Z888 Allergy status to other drugs, medicaments and biological substances status: Secondary | ICD-10-CM

## 2022-11-12 DIAGNOSIS — Z801 Family history of malignant neoplasm of trachea, bronchus and lung: Secondary | ICD-10-CM

## 2022-11-12 DIAGNOSIS — Z87891 Personal history of nicotine dependence: Secondary | ICD-10-CM

## 2022-11-12 DIAGNOSIS — Z794 Long term (current) use of insulin: Secondary | ICD-10-CM | POA: Diagnosis not present

## 2022-11-12 DIAGNOSIS — L0390 Cellulitis, unspecified: Principal | ICD-10-CM

## 2022-11-12 DIAGNOSIS — R0902 Hypoxemia: Secondary | ICD-10-CM | POA: Diagnosis not present

## 2022-11-12 DIAGNOSIS — R000 Tachycardia, unspecified: Secondary | ICD-10-CM | POA: Diagnosis not present

## 2022-11-12 DIAGNOSIS — I5032 Chronic diastolic (congestive) heart failure: Secondary | ICD-10-CM | POA: Diagnosis present

## 2022-11-12 DIAGNOSIS — Z86010 Personal history of colonic polyps: Secondary | ICD-10-CM

## 2022-11-12 DIAGNOSIS — K7581 Nonalcoholic steatohepatitis (NASH): Secondary | ICD-10-CM | POA: Diagnosis present

## 2022-11-12 DIAGNOSIS — J029 Acute pharyngitis, unspecified: Secondary | ICD-10-CM | POA: Diagnosis not present

## 2022-11-12 DIAGNOSIS — Z79899 Other long term (current) drug therapy: Secondary | ICD-10-CM

## 2022-11-12 DIAGNOSIS — L03116 Cellulitis of left lower limb: Secondary | ICD-10-CM | POA: Diagnosis not present

## 2022-11-12 DIAGNOSIS — G4489 Other headache syndrome: Secondary | ICD-10-CM | POA: Diagnosis not present

## 2022-11-12 DIAGNOSIS — A419 Sepsis, unspecified organism: Secondary | ICD-10-CM | POA: Diagnosis not present

## 2022-11-12 LAB — CBC WITH DIFFERENTIAL/PLATELET
Abs Immature Granulocytes: 0.04 10*3/uL (ref 0.00–0.07)
Basophils Absolute: 0 10*3/uL (ref 0.0–0.1)
Basophils Relative: 0 %
Eosinophils Absolute: 0 10*3/uL (ref 0.0–0.5)
Eosinophils Relative: 0 %
HCT: 40.4 % (ref 36.0–46.0)
Hemoglobin: 14 g/dL (ref 12.0–15.0)
Immature Granulocytes: 0 %
Lymphocytes Relative: 3 %
Lymphs Abs: 0.3 10*3/uL — ABNORMAL LOW (ref 0.7–4.0)
MCH: 33.7 pg (ref 26.0–34.0)
MCHC: 34.7 g/dL (ref 30.0–36.0)
MCV: 97.3 fL (ref 80.0–100.0)
Monocytes Absolute: 0.5 10*3/uL (ref 0.1–1.0)
Monocytes Relative: 5 %
Neutro Abs: 10.3 10*3/uL — ABNORMAL HIGH (ref 1.7–7.7)
Neutrophils Relative %: 92 %
Platelets: 27 10*3/uL — CL (ref 150–400)
RBC: 4.15 MIL/uL (ref 3.87–5.11)
RDW: 16 % — ABNORMAL HIGH (ref 11.5–15.5)
WBC: 11.2 10*3/uL — ABNORMAL HIGH (ref 4.0–10.5)
nRBC: 0 % (ref 0.0–0.2)

## 2022-11-12 LAB — BASIC METABOLIC PANEL
Anion gap: 9 (ref 5–15)
BUN: 17 mg/dL (ref 8–23)
CO2: 24 mmol/L (ref 22–32)
Calcium: 8.6 mg/dL — ABNORMAL LOW (ref 8.9–10.3)
Chloride: 103 mmol/L (ref 98–111)
Creatinine, Ser: 0.94 mg/dL (ref 0.44–1.00)
GFR, Estimated: >60 mL/min (ref >60)
Glucose, Bld: 137 mg/dL — ABNORMAL HIGH (ref 70–99)
Potassium: 4.1 mmol/L (ref 3.5–5.1)
Sodium: 136 mmol/L (ref 135–145)

## 2022-11-12 LAB — BRAIN NATRIURETIC PEPTIDE: B Natriuretic Peptide: 434.3 pg/mL — ABNORMAL HIGH (ref 0.0–100.0)

## 2022-11-12 LAB — MRSA NEXT GEN BY PCR, NASAL: MRSA by PCR Next Gen: NOT DETECTED

## 2022-11-12 LAB — LACTIC ACID, PLASMA
Lactic Acid, Venous: 1.8 mmol/L (ref 0.5–1.9)
Lactic Acid, Venous: 2.2 mmol/L (ref 0.5–1.9)

## 2022-11-12 LAB — GROUP A STREP BY PCR: Group A Strep by PCR: NOT DETECTED

## 2022-11-12 LAB — HIV ANTIBODY (ROUTINE TESTING W REFLEX): HIV Screen 4th Generation wRfx: NONREACTIVE

## 2022-11-12 MED ORDER — OXYCODONE HCL 5 MG PO TABS
5.0000 mg | ORAL_TABLET | ORAL | Status: DC | PRN
  Administered 2022-11-14 (×2): 5 mg via ORAL
  Filled 2022-11-12 (×3): qty 1

## 2022-11-12 MED ORDER — BISACODYL 5 MG PO TBEC: 5.0000 mg | DELAYED_RELEASE_TABLET | Freq: Every day | ORAL | Status: DC | PRN

## 2022-11-12 MED ORDER — VANCOMYCIN HCL IN DEXTROSE 1-5 GM/200ML-% IV SOLN
1000.0000 mg | Freq: Two times a day (BID) | INTRAVENOUS | Status: DC
  Administered 2022-11-13: 1000 mg via INTRAVENOUS
  Filled 2022-11-12: qty 200

## 2022-11-12 MED ORDER — ALBUTEROL SULFATE (2.5 MG/3ML) 0.083% IN NEBU: 2.5000 mg | INHALATION_SOLUTION | Freq: Four times a day (QID) | RESPIRATORY_TRACT | Status: DC | PRN

## 2022-11-12 MED ORDER — DILTIAZEM HCL-DEXTROSE 125-5 MG/125ML-% IV SOLN (PREMIX)
5.0000 mg/h | INTRAVENOUS | Status: DC
  Administered 2022-11-12: 5 mg/h via INTRAVENOUS
  Administered 2022-11-13 (×2): 15 mg/h via INTRAVENOUS
  Filled 2022-11-12 (×3): qty 125

## 2022-11-12 MED ORDER — DIGOXIN 0.25 MG/ML IJ SOLN
0.2500 mg | Freq: Four times a day (QID) | INTRAMUSCULAR | Status: AC
  Administered 2022-11-12 – 2022-11-13 (×3): 0.25 mg via INTRAVENOUS
  Filled 2022-11-12 (×3): qty 1

## 2022-11-12 MED ORDER — LACTATED RINGERS IV SOLN: INTRAVENOUS | Status: DC

## 2022-11-12 MED ORDER — INSULIN ASPART 100 UNIT/ML IJ SOLN
0.0000 [IU] | Freq: Three times a day (TID) | INTRAMUSCULAR | Status: DC
  Administered 2022-11-13 – 2022-11-15 (×4): 2 [IU] via SUBCUTANEOUS

## 2022-11-12 MED ORDER — SODIUM CHLORIDE 0.9 % IV SOLN
2.0000 g | INTRAVENOUS | Status: DC
  Administered 2022-11-12: 2 g via INTRAVENOUS
  Filled 2022-11-12: qty 20

## 2022-11-12 MED ORDER — ONDANSETRON HCL 4 MG/2ML IJ SOLN: 4.0000 mg | Freq: Four times a day (QID) | INTRAMUSCULAR | Status: DC | PRN

## 2022-11-12 MED ORDER — METOPROLOL TARTRATE 25 MG PO TABS
25.0000 mg | ORAL_TABLET | Freq: Two times a day (BID) | ORAL | Status: DC
  Administered 2022-11-12 – 2022-11-14 (×3): 25 mg via ORAL
  Filled 2022-11-12 (×4): qty 1

## 2022-11-12 MED ORDER — SODIUM CHLORIDE 0.9 % IV SOLN
2.0000 g | Freq: Four times a day (QID) | INTRAVENOUS | Status: DC
  Administered 2022-11-12 – 2022-11-13 (×3): 2 g via INTRAVENOUS
  Filled 2022-11-12 (×3): qty 12.5

## 2022-11-12 MED ORDER — ONDANSETRON HCL 4 MG PO TABS: 4.0000 mg | ORAL_TABLET | Freq: Four times a day (QID) | ORAL | Status: DC | PRN

## 2022-11-12 MED ORDER — LACTULOSE 10 GM/15ML PO SOLN
20.0000 g | Freq: Three times a day (TID) | ORAL | Status: DC
  Administered 2022-11-12 – 2022-11-15 (×9): 20 g via ORAL
  Filled 2022-11-12 (×10): qty 30

## 2022-11-12 MED ORDER — VANCOMYCIN HCL 2000 MG/400ML IV SOLN
2000.0000 mg | Freq: Once | INTRAVENOUS | Status: AC
  Administered 2022-11-12: 2000 mg via INTRAVENOUS
  Filled 2022-11-12: qty 400

## 2022-11-12 MED ORDER — METOPROLOL TARTRATE 5 MG/5ML IV SOLN
5.0000 mg | Freq: Once | INTRAVENOUS | Status: AC
  Administered 2022-11-12: 5 mg via INTRAVENOUS
  Filled 2022-11-12: qty 5

## 2022-11-12 MED ORDER — RIFAXIMIN 550 MG PO TABS
550.0000 mg | ORAL_TABLET | Freq: Two times a day (BID) | ORAL | Status: DC
  Administered 2022-11-12 – 2022-11-15 (×6): 550 mg via ORAL
  Filled 2022-11-12 (×7): qty 1

## 2022-11-12 MED ORDER — DIGOXIN 0.25 MG/ML IJ SOLN
0.2500 mg | Freq: Four times a day (QID) | INTRAMUSCULAR | Status: DC
  Administered 2022-11-12: 0.25 mg via INTRAVENOUS
  Filled 2022-11-12: qty 2

## 2022-11-12 MED ORDER — BENZONATATE 100 MG PO CAPS: 100.0000 mg | ORAL_CAPSULE | Freq: Three times a day (TID) | ORAL | Status: DC | PRN

## 2022-11-12 MED ORDER — METOPROLOL TARTRATE 5 MG/5ML IV SOLN: 2.5000 mg | Freq: Four times a day (QID) | INTRAVENOUS | Status: DC | PRN

## 2022-11-12 NOTE — ED Provider Notes (Signed)
[facility] Provider Note   CSN: [id number] Arrival date & time: 11/12/22  1405     History  Chief Complaint  Patient presents with   Dizziness    Afib RVR     Hannah Khan is a 67 y.o. female.  67 year-old female who presents via EMS due to dizziness.  Patient has history of chronic A-fib and is not on anticoagulation due to risk of GI bleeding as well as thrombocytopenia.  Patient does take Cardizem as well as beta-blockers according to the old medical record.  Denies any chest pain.  Could tell heart rate was beating fast.  EMS gave patient 35 mg of Cardizem and 4 mg of Zofran as well as tingling.  She does have a history of CHF.  Patient also notes that she has had increased erythema to her lower abdomen.  Has a history of cellulitis and states this is similar.  She does endorse chills.  No vomiting appreciated       Home Medications Prior to Admission medications   Medication Sig Start Date End Date Taking? Authorizing Provider  albuterol (PROVENTIL) (2.5 MG/3ML) 0.083% nebulizer solution Take 3 mLs (2.5 mg total) by nebulization every 6 (six) hours as needed for wheezing or shortness of breath. 03/22/21   Dessa Phi, DO  Bacitracin-Polymyxin B (NEOSPORIN EX) Apply 1 Application topically daily as needed (wound Treatment).    [provider]  benzonatate (TESSALON PERLES) 100 MG capsule Take 1 capsule (100 mg total) by mouth 3 (three) times daily as needed for cough. 11/03/22   Loel Dubonnet, NP  bumetanide (BUMEX) 2 MG tablet Take 1 tablet (2 mg total) by mouth 2 (two) times daily. Patient taking differently: Take 2 mg by mouth daily. 01/12/22   Skeet Latch, MD  diltiazem (CARDIZEM CD) 180 MG 24 hr capsule TAKE 1 CAPSULE BY MOUTH EVERY DAY 10/20/22   Loel Dubonnet, NP  empagliflozin (JARDIANCE) 25 MG TABS tablet Take 25 mg by mouth daily.    [provider]  lactulose (CHRONULAC) 10 GM/15ML solution  Take 30 mLs by mouth 3 (three) times daily. 03/17/21   [provider]  Lancets (ONETOUCH DELICA PLUS 123XX123) MISC Apply topically 3 (three) times daily. 04/10/20   [provider]  lidocaine (SALONPAS PAIN RELIEVING) 4 % Place 1 patch onto the skin daily as needed (back pain).    [provider]  metolazone (ZAROXOLYN) 5 MG tablet TAKE 1 TABLE EVERY 3 DAYS AS NEEDED FOR MORE THAN 4 POUND WEIGHT GAIN. 07/11/22   Skeet Latch, MD  metoprolol succinate (TOPROL-XL) 50 MG 24 hr tablet TAKE 3 TABLETS ONCE A DAY Take with or immediately following a meal. 07/14/22   Skeet Latch, MD  Multiple Vitamins-Minerals (PRESERVISION AREDS 2) CAPS Take 1 capsule by mouth 2 (two) times daily.    [provider]  [facility] VERIO test strip 1 each 3 (three) times daily. 04/13/20   [provider]  potassium chloride (MICRO-K) 10 MEQ CR capsule Take 40 mEq by mouth 2 (two) times daily.    [provider]  Propylene Glycol (SYSTANE BALANCE) 0.6 % SOLN Place 1 drop into both eyes 2 (two) times daily.    [provider]  Semaglutide, 2 MG/DOSE, (OZEMPIC, 2 MG/DOSE,) 8 MG/3ML SOPN Inject 2 mg into the skin once a week. 11/03/22   Loel Dubonnet, NP  XIFAXAN 550 MG TABS tablet Take 550 mg by mouth 2 (two)  times daily. 03/13/21   [provider]      Allergies    Celecoxib, Shellfish allergy, Barium-containing compounds, Prednisone, Statins, Fentanyl, Ibuprofen, Tramadol, Tylenol [acetaminophen], and Zetia [ezetimibe]    Review of Systems   Review of Systems  All other systems reviewed and are negative.   Physical Exam Updated Vital Signs BP 121/67 (BP Location: Right Arm)   Pulse (!) 142   Temp (!) 101 F (38.3 C) (Oral)   Resp 18   Ht 1.702 m ('5\' 7"'$ )   Wt (!) 161.9 kg   SpO2 95%   BMI 55.91 kg/m  Physical Exam Vitals and nursing note reviewed.  Constitutional:      General: She is not in acute distress.    Appearance:  Normal appearance. She is well-developed. She is not toxic-appearing.  HENT:     Head: Normocephalic and atraumatic.  Eyes:     General: Lids are normal.     Conjunctiva/sclera: Conjunctivae normal.     Pupils: Pupils are equal, round, and reactive to light.  Neck:     Thyroid: No thyroid mass.     Trachea: No tracheal deviation.  Cardiovascular:     Rate and Rhythm: Tachycardia present. Rhythm irregular.     Heart sounds: Normal heart sounds. No murmur heard.    No gallop.  Pulmonary:     Effort: Pulmonary effort is normal. No respiratory distress.     Breath sounds: Normal breath sounds. No stridor. No decreased breath sounds, wheezing, rhonchi or rales.  Abdominal:     General: There is no distension.     Palpations: Abdomen is soft.     Tenderness: There is no abdominal tenderness. There is no rebound.    Musculoskeletal:        General: No tenderness. Normal range of motion.     Cervical back: Normal range of motion and neck supple.  Skin:    General: Skin is warm and dry.     Findings: No abrasion or rash.  Neurological:     Mental Status: She is alert and oriented to person, place, and time. Mental status is at baseline.     GCS: GCS eye subscore is 4. GCS verbal subscore is 5. GCS motor subscore is 6.     Cranial Nerves: No cranial nerve deficit.     Sensory: No sensory deficit.     Motor: Motor function is intact.  Psychiatric:        Attention and Perception: Attention normal.        Speech: Speech normal.        Behavior: Behavior normal.     ED Results / Procedures / Treatments   Labs (all labs ordered are listed, but only abnormal results are displayed) Labs Reviewed  CULTURE, BLOOD (ROUTINE X 2)  CULTURE, BLOOD (ROUTINE X 2)  BASIC METABOLIC PANEL  CBC WITH DIFFERENTIAL/PLATELET  LACTIC ACID, PLASMA  LACTIC ACID, PLASMA  BRAIN NATRIURETIC PEPTIDE    EKG EKG Interpretation  Date/Time:  Saturday November 12 2022 14:39:09 EST Ventricular Rate:   136 PR Interval:    QRS Duration: 96 QT Interval:  295 QTC Calculation: 444 R Axis:   -74 Text Interpretation: Atrial fibrillation Left anterior fascicular block Low voltage, precordial leads Consider anterior infarct Confirmed by Lacretia Leigh ([id number]) on 11/12/2022 2:44:44 PM  Radiology No results found.  Procedures Procedures    Medications Ordered in ED Medications  lactated ringers infusion (has no administration in time range)  cefTRIAXone (ROCEPHIN)  2 g in sodium chloride 0.9 % 100 mL IVPB (has no administration in time range)  metoprolol tartrate (LOPRESSOR) injection 5 mg (has no administration in time range)    ED Course/ Medical Decision Making/ A&P                             Medical Decision Making Amount and/or Complexity of Data Reviewed Labs: ordered. Radiology: ordered.  Risk Prescription drug management.   Patient is EKG per interpretation shows A-fib with rapid ventricular rate response.  Suspect that patient's underlying fever is driving this as well.  Given Lopressor 5 mg IV push.  No response to her heart rate with this.  Will repeat Lopressor 5 mg IV push.  Will also start a Cardizem drip as well to.  Patient has no evidence of sepsis but will require admission for her cellulitis.  Started on IV antibiotics for this.  Will require inpatient hospitalization and will consult hospitalist team  CRITICAL CARE Performed by: Leota Jacobsen Total critical care time: 50 minutes Critical care time was exclusive of separately billable procedures and treating other patients. Critical care was necessary to treat or prevent imminent or life-threatening deterioration. Critical care was time spent personally by me on the following activities: development of treatment plan with patient and/or surrogate as well as nursing, discussions with consultants, evaluation of patient's response to treatment, examination of patient, obtaining history from patient or surrogate,  ordering and performing treatments and interventions, ordering and review of laboratory studies, ordering and review of radiographic studies, pulse oximetry and re-evaluation of patient's condition.         Final Clinical Impression(s) / ED Diagnoses Final diagnoses:  None    Rx / DC Orders ED Discharge Orders     None         Lacretia Leigh, MD 11/12/22 1644

## 2022-11-12 NOTE — H&P (Signed)
History and Physical    Hannah Khan [id number] DOB: 11-26-1955 DOA: 11/12/2022  PCP: Lujean Amel, MD (Confirm with patient/family/NH records and if not entered, this has to be entered at [facility] [address] point of entry) Patient coming from: Home  I have personally briefly reviewed patient's old medical records in [place]  Chief Complaint: cellulitis  HPI: Hannah Khan is a 67 y.o. female with medical history significant of chronic HFpEF, morbid obesity, OSA, NASH/cirrhosis with chronic thrombocytopenia, IDDM, presented with worsening of rash and pain of abdominal wall.  Symptoms started 7 to 10 days ago with initial rash on the lower abdominal wall then over the last 2 to 3 days the rash spread to most of the lower half of abdominal wall skin with significant tenderness and subjective fever and chills at home.  Today patient developed palpitations and started to feel lightheadedness.  Family called EMS, EMS arrived and found patient in A-fib was given Cardizem and 250 mL with normal saline.  ED Course: Temperature 101, patient was found to be in rapid A-fib, started on Cardizem drip and physical exam showed extensive abdominal wall cellulitis and ceftriaxone started.  WBC 11.2, hemoglobin 14 Lactic acid 1.8. Review of Systems: As per HPI otherwise 14 point review of systems negative.    Past Medical History:  Diagnosis Date   Anemia    hx of   Arthritis    knees   Diabetes mellitus without complication (HCC)    type 2   Fibromyalgia    Hx of colonic polyps    s/p partial colectomy   Hyperlipidemia    Joint pain    in knees and back spasms   Left leg swelling    wear compression hose   Liver cirrhosis secondary to NASH (nonalcoholic steatohepatitis) ([place]) dx nov 2014   had enlarged spleen also    Morbid obesity ([place])    Persistent atrial fibrillation ([place])    Pneumonia 10/2016   PONV (postoperative nausea and vomiting)    in past none recent   Portal hypertension  ([place])    Sleep apnea    uses cpap setting of 14   Spleen enlarged    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 [facility])     Past Surgical History:  Procedure Laterality Date   BREAST LUMPECTOMY WITH RADIOACTIVE SEED LOCALIZATION Right 08/29/2017   Procedure: RIGHT BREAST LUMPECTOMY WITH RADIOACTIVE SEEDS LOCALIZATION;  Surgeon: Erroll Luna, MD;  Location: [place];  Service: General;  Laterality: Right;   [place]  20 yrs ago   COLECTOMY  2011   COLONOSCOPY     COLONOSCOPY N/A 09/05/2018   Procedure: COLONOSCOPY;  Surgeon: [name], MD;  Location: [facility];  Service: Endoscopy;  Laterality: N/A;   COLONOSCOPY WITH PROPOFOL Bilateral 05/04/2022   Procedure: COLONOSCOPY WITH PROPOFOL;  Surgeon: [name], MD;  Location: [facility];  Service: Gastroenterology;  Laterality: Bilateral;   DILATATION & CURETTAGE/HYSTEROSCOPY WITH MYOSURE N/A 05/06/2016   Procedure: DILATATION & CURETTAGE/HYSTEROSCOPY WITH MYOSURE WITH POLYPECTOMY;  Surgeon: Janyth Pupa, DO;  Location: [facility];  Service: Gynecology;  Laterality: N/A;   ESOPHAGOGASTRODUODENOSCOPY (EGD) WITH PROPOFOL N/A 09/04/2013   Procedure: ESOPHAGOGASTRODUODENOSCOPY (EGD) WITH PROPOFOL;  Surgeon: [name], MD;  [facility];  Service: Endoscopy;  Laterality: N/A;   ESOPHAGOGASTRODUODENOSCOPY (EGD) WITH PROPOFOL Bilateral 05/04/2022   Procedure: ESOPHAGOGASTRODUODENOSCOPY (EGD) WITH PROPOFOL;  Surgeon: [name], MD;  [facility];  Service: Gastroenterology;  Laterality: Bilateral;   IR RADIOLOGIST  EVAL & MGMT  09/21/2022   IR RADIOLOGIST EVAL & MGMT  10/06/2022   IR RADIOLOGIST EVAL & MGMT  11/02/2022   KNEE ARTHROSCOPY  yrs ago   bilateral, one done x 1, one done twice   POLYPECTOMY  09/05/2018   Procedure: POLYPECTOMY;  Surgeon: [name], MD;  Location: [facility];  Service: Endoscopy;;     reports that she quit smoking about 48 years ago. Her smoking use  included cigarettes. She has a 3.00 pack-year smoking history. She has never used smokeless tobacco. She reports current alcohol use. She reports that she does not use drugs.  Allergies  Allergen Reactions   Celecoxib Other (See Comments)    "speech slurred" with celebrex   Shellfish Allergy Swelling and Rash    TINGLING AND SWELLING OF LIPS. LARGE WHELPS QUARTER-SIZE   Barium-Containing Compounds Other (See Comments)    TACHYCARDIA   Prednisone Other (See Comments)    TACHYCARDIA   Statins Other (See Comments)    AGGRAVATED FIBROMYALGIA   Fentanyl     Hallucinations    Ibuprofen Other (See Comments)    UNSPECIFIED SPECIFIC REACTION >> "DUE TO PLATELETS"   Tramadol     Hallucination    Tylenol [Acetaminophen] Other (See Comments)    UNSPECIFIED SPECIFIC REACTION >> "DUE TO PLATELETS"   Zetia [Ezetimibe]     Headache    Family History  Problem Relation Age of Onset   Heart failure Mother    Cancer Mother    Hyperlipidemia Mother    Congestive Heart Failure Mother    Stroke Mother    Lung cancer Father    Cancer Father    Cancer Brother    Hyperlipidemia Brother    Stroke Other      Prior to Admission medications   Medication Sig Start Date End Date Taking? Authorizing Provider  albuterol (PROVENTIL) (2.5 MG/3ML) 0.083% nebulizer solution Take 3 mLs (2.5 mg total) by nebulization every 6 (six) hours as needed for wheezing or shortness of breath. 03/22/21   Dessa Phi, DO  Bacitracin-Polymyxin B (NEOSPORIN EX) Apply 1 Application topically daily as needed (wound Treatment).    [provider]  benzonatate (TESSALON PERLES) 100 MG capsule Take 1 capsule (100 mg total) by mouth 3 (three) times daily as needed for cough. 11/03/22   Loel Dubonnet, NP  bumetanide (BUMEX) 2 MG tablet Take 1 tablet (2 mg total) by mouth 2 (two) times daily. Patient taking differently: Take 2 mg by mouth daily. 01/12/22   Skeet Latch, MD  diltiazem (CARDIZEM CD) 180 MG 24  hr capsule TAKE 1 CAPSULE BY MOUTH EVERY DAY 10/20/22   Loel Dubonnet, NP  empagliflozin (JARDIANCE) 25 MG TABS tablet Take 25 mg by mouth daily.    [provider]  lactulose (CHRONULAC) 10 GM/15ML solution Take 30 mLs by mouth 3 (three) times daily. 03/17/21   [provider]  Lancets (ONETOUCH DELICA PLUS 123XX123) MISC Apply topically 3 (three) times daily. 04/10/20   [provider]  lidocaine (SALONPAS PAIN RELIEVING) 4 % Place 1 patch onto the skin daily as needed (back pain).    [provider]  metolazone (ZAROXOLYN) 5 MG tablet TAKE 1 TABLE EVERY 3 DAYS AS NEEDED FOR MORE THAN 4 POUND WEIGHT GAIN. 07/11/22   Skeet Latch, MD  metoprolol succinate (TOPROL-XL) 50 MG 24 hr tablet TAKE 3 TABLETS ONCE A DAY Take with or immediately following a meal. 07/14/22   Skeet Latch, MD  Multiple Vitamins-Minerals (  PRESERVISION AREDS 2) CAPS Take 1 capsule by mouth 2 (two) times daily.    [provider]  [facility] VERIO test strip 1 each 3 (three) times daily. 04/13/20   [provider]  potassium chloride (MICRO-K) 10 MEQ CR capsule Take 40 mEq by mouth 2 (two) times daily.    [provider]  Propylene Glycol (SYSTANE BALANCE) 0.6 % SOLN Place 1 drop into both eyes 2 (two) times daily.    [provider]  Semaglutide, 2 MG/DOSE, (OZEMPIC, 2 MG/DOSE,) 8 MG/3ML SOPN Inject 2 mg into the skin once a week. 11/03/22   Loel Dubonnet, NP  XIFAXAN 550 MG TABS tablet Take 550 mg by mouth 2 (two) times daily. 03/13/21   [provider]    Physical Exam: Vitals:   11/12/22 1815 11/12/22 1830 11/12/22 1845 11/12/22 1900  BP: 123/65 126/69 118/63 136/74  Pulse: 91 (!) 132 (!) 132 (!) 131  Resp: 20 (!) 21 (!) 22 19  Temp:      TempSrc:      SpO2: 100% 99% 99% 100%  Weight:      Height:        Constitutional: NAD, calm, comfortable Vitals:   11/12/22 1815 11/12/22 1830 11/12/22 1845 11/12/22 1900  BP: 123/65  126/69 118/63 136/74  Pulse: 91 (!) 132 (!) 132 (!) 131  Resp: 20 (!) 21 (!) 22 19  Temp:      TempSrc:      SpO2: 100% 99% 99% 100%  Weight:      Height:       Eyes: PERRL, lids and conjunctivae normal ENMT: Mucous membranes are moist. Posterior pharynx clear of any exudate or lesions.Normal dentition.  Neck: normal, supple, no masses, no thyromegaly Respiratory: clear to auscultation bilaterally, no wheezing, no crackles. Normal respiratory effort. No accessory muscle use.  Cardiovascular: Irregular heartbeat, no murmurs / rubs / gallops. No extremity edema. 2+ pedal pulses. No carotid bruits.  Abdomen: no tenderness, no masses palpated. No hepatosplenomegaly. Bowel sounds positive.  Musculoskeletal: no clubbing / cyanosis. No joint deformity upper and lower extremities. Good ROM, no contractures. Normal muscle tone.  Skin: Rash tenderness and warmness to touch on lower half of the abdominal wall skin below umbilicus no significant discharge or skin lesions Neurologic: CN 2-12 grossly intact. Sensation intact, DTR normal. Strength 5/5 in all 4.  Psychiatric: Normal judgment and insight. Alert and oriented x 3. Normal mood.     Labs on Admission: I have personally reviewed following labs and imaging studies  CBC: Recent Labs  Lab 11/12/22 1431  WBC 11.2*  NEUTROABS 10.3*  HGB 14.0  HCT 40.4  MCV 97.3  PLT 27*   Basic Metabolic Panel: Recent Labs  Lab 11/12/22 1431  NA 136  K 4.1  CL 103  CO2 24  GLUCOSE 137*  BUN 17  CREATININE 0.94  CALCIUM 8.6*   GFR: Estimated Creatinine Clearance: 94.5 mL/min (by C-G formula based on SCr of 0.94 mg/dL). Liver Function Tests: No results for input(s): "AST", "ALT", "ALKPHOS", "BILITOT", "PROT", "ALBUMIN" in the last 168 hours. No results for input(s): "LIPASE", "AMYLASE" in the last 168 hours. No results for input(s): "AMMONIA" in the last 168 hours. Coagulation Profile: No results for input(s): "INR", "PROTIME" in the last  168 hours. Cardiac Enzymes: No results for input(s): "CKTOTAL", "CKMB", "CKMBINDEX", "TROPONINI" in the last 168 hours. BNP (last 3 results) No results for input(s): "PROBNP" in the last 8760 hours. HbA1C: No results for input(s): "  HGBA1C" in the last 72 hours. CBG: No results for input(s): "GLUCAP" in the last 168 hours. Lipid Profile: No results for input(s): "CHOL", "HDL", "LDLCALC", "TRIG", "CHOLHDL", "LDLDIRECT" in the last 72 hours. Thyroid Function Tests: No results for input(s): "TSH", "T4TOTAL", "FREET4", "T3FREE", "THYROIDAB" in the last 72 hours. Anemia Panel: No results for input(s): "VITAMINB12", "FOLATE", "FERRITIN", "TIBC", "IRON", "RETICCTPCT" in the last 72 hours. Urine considered to be severe:    Component Value Date/Time   COLORURINE YELLOW 03/18/2021 0416   APPEARANCEUR HAZY (A) 03/18/2021 0416   LABSPEC 1.017 03/18/2021 0416   PHURINE 5.0 03/18/2021 0416   GLUCOSEU NEGATIVE 03/18/2021 0416   HGBUR MODERATE (A) 03/18/2021 0416   BILIRUBINUR NEGATIVE 03/18/2021 0416   KETONESUR NEGATIVE 03/18/2021 0416   PROTEINUR 30 (A) 03/18/2021 0416   UROBILINOGEN 2.0 (H) 01/31/2015 0055   NITRITE NEGATIVE 03/18/2021 0416   LEUKOCYTESUR SMALL (A) 03/18/2021 0416    Radiological Exams on Admission: DG Chest Port 1 View  Result Date: 11/12/2022 CLINICAL DATA:  Shortness of breath EXAM: PORTABLE CHEST 1 VIEW COMPARISON:  03/29/2021 FINDINGS: UPPER limits normal heart size noted. Pulmonary vascular congestion is identified. LEFT retrocardiac opacity may be artifactual or represent consolidation/atelectasis. There is no evidence pneumothorax or large pleural effusion. No acute bony abnormalities are noted. IMPRESSION: 1. LEFT retrocardiac opacity which may be artifactual or represent consolidation/atelectasis. Consider PA and LATERAL chest for further evaluation. 2. Pulmonary vascular congestion. Electronically Signed   By: Margarette Canada M.D.   On: 11/12/2022 15:10    EKG:  Independently reviewed.  A-fib with RVR, no significant ST changes.  Assessment/Plan Principal Problem:   Sepsis ([place]) Active Problems:   Abdominal wall cellulitis   Liver cirrhosis secondary to NASH (nonalcoholic steatohepatitis) ([place])  (please populate well all problems here in Problem List. (For example, if patient is on BP meds at home and you resume or decide to hold them, it is a problem that needs to be her. Same for CAD, COPD, HLD and so on)  Sepsis -Sepsis evidenced by tachycardia, leukocytosis fever, with source of infection is extensive lower abdominal wall cellulitis. -Blood x2 -Cellulitis considered to be severe given the surface area involved of abdominal surface more than 50%, as recommendation from pharmacy, start patient on vancomycin and cefepime.  Send MRSA screen and strep a PCR -Hold off her home dose of diuresis -Given there is a significant history of CHF with borderline low blood pressure and chest x-ray showing signs of pulmonary congestion, will hold off maintenance IV fluid as well as patient BP stable.  A-fib with RVR -On Cardizem drip -Given there is a baseline HFpEF and today's x-ray showing signs of pulmonary congestion, will give a full dose of digoxin loading of 0.25 mg x 4 doses in 24 hours -As needed Lopressor for breakthrough heart rate -Given the significant history of cirrhosis and chronic thrombocytopenia, not a candidate for systemic anticoagulation.  NASH/cirrhosis -Appears to be compensated -Hold off diuresis today as patient is septic -She was cleared by cardiology for TIPS -Continue Xifaxan and lactulose  Morbid obesity -Outpatient bariatric evaluation  IIDM -Switch to sliding scale as she is septic  Chronic thrombocytopenia -Hold off chemical DVT prophylaxis  DVT prophylaxis: SCD Code Status: Full code Family Communication: Daughter at bedside Disposition Plan: Patient is sick with sepsis from extensive cellulitis requiring IV  antibiotics and A-fib recurrent IV rate control medications.  Expect less than 2 midnight hospital stay Consults called: None Admission status: PCU   Enza Shone T  Toussaint Golson MD [facility] Pager [phone]  11/12/2022, 7:56 PM

## 2022-11-12 NOTE — ED Triage Notes (Signed)
Pt coming from home, called ems due to dizziness. Pt is in afib  '35mg'$  Cardizem '4mg'$  zofran 250 NS

## 2022-11-12 NOTE — Progress Notes (Signed)
Pharmacy Antibiotic Note  Hannah Khan is a 67 y.o. female admitted on 11/12/2022 presenting with dizziness, lower abdominal cellulitis.  Pharmacy has been consulted for cefepime and vancomycin dosing.  Plan: Vancomycin 2g IV x 1, then 1g IV q 12h (eAUC 475) Cefepime 2g IV every 8 hours Monitor renal function, Cx and clinical progression to narrow Vancomycin levels as indicated  Height: '5\' 7"'$  (170.2 cm) Weight: (!) 161.9 kg (356 lb 15.9 oz) IBW/kg (Calculated) : 61.6  Temp (24hrs), Avg:99.5 F (37.5 C), Min:98 F (36.7 C), Max:101 F (38.3 C)  Recent Labs  Lab 11/12/22 1431 11/12/22 1439  WBC 11.2*  --   CREATININE 0.94  --   LATICACIDVEN  --  1.8    Estimated Creatinine Clearance: 94.5 mL/min (by C-G formula based on SCr of 0.94 mg/dL).    Allergies  Allergen Reactions   Celecoxib Other (See Comments)    "speech slurred" with celebrex   Shellfish Allergy Swelling and Rash    TINGLING AND SWELLING OF LIPS. LARGE WHELPS QUARTER-SIZE   Barium-Containing Compounds Other (See Comments)    TACHYCARDIA   Prednisone Other (See Comments)    TACHYCARDIA   Statins Other (See Comments)    AGGRAVATED FIBROMYALGIA   Fentanyl     Hallucinations    Ibuprofen Other (See Comments)    UNSPECIFIED SPECIFIC REACTION >> "DUE TO PLATELETS"   Tramadol     Hallucination    Tylenol [Acetaminophen] Other (See Comments)    UNSPECIFIED SPECIFIC REACTION >> "DUE TO PLATELETS"   Zetia [Ezetimibe]     Headache    Bertis Ruddy, PharmD, [place] Pharmacist ED Pharmacist Phone # [phone] 11/12/2022 5:43 PM

## 2022-11-12 NOTE — ED Notes (Signed)
ED TO INPATIENT HANDOFF REPORT  ED Nurse Name and Phone #: [phone]  S Name/Age/Gender Hannah Khan 66 y.o. female Room/Bed: 006C/006C  Code Status   Code Status: Full Code  Home/SNF/Other Home Patient oriented to: self, place, time, and situation Is this baseline? Yes   Triage Complete: Triage complete  Chief Complaint Sepsis [facility]) [A41.9]  Triage Note Pt coming from home, called ems due to dizziness. Pt is in afib  '35mg'$  Cardizem '4mg'$  zofran 250 NS      Allergies Allergies  Allergen Reactions   Celecoxib Other (See Comments)    "speech slurred" with celebrex   Shellfish Allergy Swelling and Rash    TINGLING AND SWELLING OF LIPS. LARGE WHELPS QUARTER-SIZE   Barium-Containing Compounds Other (See Comments)    TACHYCARDIA   Prednisone Other (See Comments)    TACHYCARDIA   Statins Other (See Comments)    AGGRAVATED FIBROMYALGIA   Fentanyl     Hallucinations    Ibuprofen Other (See Comments)    UNSPECIFIED SPECIFIC REACTION >> "DUE TO PLATELETS"   Tramadol     Hallucination    Tylenol [Acetaminophen] Other (See Comments)    UNSPECIFIED SPECIFIC REACTION >> "DUE TO PLATELETS"   Zetia [Ezetimibe]     Headache    Level of Care/Admitting Diagnosis ED Disposition     ED Disposition  Admit   Condition  --   [place]: [place] [[id number]]  Level of Care: Progressive [102]  Admit to Progressive based on following criteria: CARDIOVASCULAR & THORACIC of moderate stability with acute coronary syndrome symptoms/low risk myocardial infarction/hypertensive urgency/arrhythmias/heart failure potentially compromising stability and stable post cardiovascular intervention patients.  May admit patient to [facility] or [facility] if equivalent level of care is available:: No  Covid Evaluation: Asymptomatic - no recent exposure (last 10 days) testing not required  Diagnosis: Sepsis [facility]) [id number]  Admitting Physician: Lequita Halt  [id number]  Attending Physician: Lequita Halt [id number]  Certification:: I certify this patient will need inpatient services for at least 2 midnights  Estimated Length of Stay: 7          B Medical/Surgery History Past Medical History:  Diagnosis Date   Anemia    hx of   Arthritis    knees   Diabetes mellitus without complication ([place])    type 2   Fibromyalgia    Hx of colonic polyps    s/p partial colectomy   Hyperlipidemia    Joint pain    in knees and back spasms   Left leg swelling    wear compression hose   Liver cirrhosis secondary to NASH (nonalcoholic steatohepatitis) ([place]) dx nov 2014   had enlarged spleen also    Morbid obesity ([place])    Persistent atrial fibrillation ([place])    Pneumonia 10/2016   PONV (postoperative nausea and vomiting)    in past none recent   Portal hypertension ([place])    Sleep apnea    uses cpap setting of 14   Spleen enlarged    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 [facility])    Past Surgical History:  Procedure Laterality Date   BREAST LUMPECTOMY WITH RADIOACTIVE SEED LOCALIZATION Right 08/29/2017   Procedure: RIGHT BREAST LUMPECTOMY WITH RADIOACTIVE SEEDS LOCALIZATION;  Surgeon: Erroll Luna, MD;  Location: [place];  Service: General;  Laterality: Right;   [place]  20 yrs ago   COLECTOMY  2011   COLONOSCOPY  COLONOSCOPY N/A 09/05/2018   Procedure: COLONOSCOPY;  Surgeon: [name], MD;  [facility];  Service: Endoscopy;  Laterality: N/A;   COLONOSCOPY WITH PROPOFOL Bilateral 05/04/2022   Procedure: COLONOSCOPY WITH PROPOFOL;  Surgeon: [name], MD;  [facility];  Service: Gastroenterology;  Laterality: Bilateral;   DILATATION & CURETTAGE/HYSTEROSCOPY WITH MYOSURE N/A 05/06/2016   Procedure: DILATATION & CURETTAGE/HYSTEROSCOPY WITH MYOSURE WITH POLYPECTOMY;  Surgeon: Janyth Pupa, DO;  Location: [facility];  Service: Gynecology;  Laterality: N/A;    ESOPHAGOGASTRODUODENOSCOPY (EGD) WITH PROPOFOL N/A 09/04/2013   Procedure: ESOPHAGOGASTRODUODENOSCOPY (EGD) WITH PROPOFOL;  Surgeon: [name], MD;  [facility];  Service: Endoscopy;  Laterality: N/A;   ESOPHAGOGASTRODUODENOSCOPY (EGD) WITH PROPOFOL Bilateral 05/04/2022   Procedure: ESOPHAGOGASTRODUODENOSCOPY (EGD) WITH PROPOFOL;  Surgeon: [name], MD;  [facility];  Service: Gastroenterology;  Laterality: Bilateral;   IR RADIOLOGIST EVAL & MGMT  09/21/2022   IR RADIOLOGIST EVAL & MGMT  10/06/2022   IR RADIOLOGIST EVAL & MGMT  11/02/2022   KNEE ARTHROSCOPY  yrs ago   bilateral, one done x 1, one done twice   POLYPECTOMY  09/05/2018   Procedure: POLYPECTOMY;  Surgeon: [name], MD;  [facility];  Service: Endoscopy;;     A IV Location/Drains/Wounds Patient Lines/Drains/Airways Status     Active Line/Drains/Airways     Name Placement date Placement time Site Days   Peripheral IV 11/12/22 20 G 2.5" Anterior;Right;Upper Arm 11/12/22  1530  Arm  less than 1            Intake/Output Last 24 hours No intake or output data in the 24 hours ending 11/12/22 1804  Labs/Imaging Results for orders placed or performed during the hospital encounter of 11/12/22 (from the past 48 hour(s))  Basic metabolic panel     Status: Abnormal   Collection Time: 11/12/22  2:31 PM  Result Value Ref Range   Sodium 136 135 - 145 mmol/L   Potassium 4.1 3.5 - 5.1 mmol/L   Chloride 103 98 - 111 mmol/L   CO2 24 22 - 32 mmol/L   Glucose, Bld 137 (H) 70 - 99 mg/dL    Comment: Glucose reference range applies only to samples taken after fasting for at least 8 hours.   BUN 17 8 - 23 mg/dL   Creatinine, Ser 0.94 0.44 - 1.00 mg/dL   Calcium 8.6 (L) 8.9 - 10.3 mg/dL   GFR, Estimated >60 >60 mL/min    Comment: (NOTE) Calculated using the CKD-EPI Creatinine Equation (2021)    Anion gap 9 5 - 15    Comment: Performed at [facility] [address],  [place], [place] [address]  CBC with Differential     Status: Abnormal   Collection Time: 11/12/22  2:31 PM  Result Value Ref Range   WBC 11.2 (H) 4.0 - 10.5 K/uL   RBC 4.15 3.87 - 5.11 MIL/uL   Hemoglobin 14.0 12.0 - 15.0 g/dL   HCT 40.4 36.0 - 46.0 %   MCV 97.3 80.0 - 100.0 fL   MCH 33.7 26.0 - 34.0 pg   MCHC 34.7 30.0 - 36.0 g/dL   RDW 16.0 (H) 11.5 - 15.5 %   Platelets 27 (LL) 150 - 400 K/uL    Comment: Immature Platelet Fraction may be clinically indicated, consider ordering this additional test [id number] REPEATED TO VERIFY PLATELET COUNT CONFIRMED BY SMEAR THIS CRITICAL RESULT HAS VERIFIED AND BEEN CALLED TO A,Ivory Bail RN BY ELYSE BENTON ON 02 24 2024 AT  1608, AND HAS BEEN READ BACK.     nRBC 0.0 0.0 - 0.2 %   Neutrophils Relative % 92 %   Neutro Abs 10.3 (H) 1.7 - 7.7 K/uL   Lymphocytes Relative 3 %   Lymphs Abs 0.3 (L) 0.7 - 4.0 K/uL   Monocytes Relative 5 %   Monocytes Absolute 0.5 0.1 - 1.0 K/uL   Eosinophils Relative 0 %   Eosinophils Absolute 0.0 0.0 - 0.5 K/uL   Basophils Relative 0 %   Basophils Absolute 0.0 0.0 - 0.1 K/uL   Immature Granulocytes 0 %   Abs Immature Granulocytes 0.04 0.00 - 0.07 K/uL    Comment: Performed at [facility] [address], [place], [place] [address]  Lactic acid, plasma     Status: None   Collection Time: 11/12/22  2:39 PM  Result Value Ref Range   Lactic Acid, Venous 1.8 0.5 - 1.9 mmol/L    Comment: Performed at [facility] [address] [place], [place] [address]  Brain natriuretic peptide     Status: Abnormal   Collection Time: 11/12/22  2:43 PM  Result Value Ref Range   B Natriuretic Peptide 434.3 (H) 0.0 - 100.0 pg/mL    Comment: Performed at [facility] [address], [place], [place] [address]   DG Chest Port 1 View  Result Date: 11/12/2022 CLINICAL DATA:  Shortness of breath EXAM: PORTABLE CHEST 1 VIEW COMPARISON:  03/29/2021 FINDINGS: UPPER limits normal heart size noted. Pulmonary vascular  congestion is identified. LEFT retrocardiac opacity may be artifactual or represent consolidation/atelectasis. There is no evidence pneumothorax or large pleural effusion. No acute bony abnormalities are noted. IMPRESSION: 1. LEFT retrocardiac opacity which may be artifactual or represent consolidation/atelectasis. Consider PA and LATERAL chest for further evaluation. 2. Pulmonary vascular congestion. Electronically Signed   By: Margarette Canada M.D.   On: 11/12/2022 15:10    Pending Labs Unresulted Labs (From admission, onward)     Start     Ordered   11/13/22 [id number]  Basic metabolic panel  Tomorrow morning,   R       Question:  Specimen collection method  Answer:  IV Team=IV Team collect   11/12/22 1747   11/13/22 0500  CBC  Tomorrow morning,   R       Question:  Specimen collection method  Answer:  IV Team=IV Team collect   11/12/22 1747   11/12/22 1748  Hemoglobin A1c  Once,   R       Comments: To assess prior glycemic control   Question:  Specimen collection method  Answer:  IV Team=IV Team collect   11/12/22 1747   11/12/22 1746  HIV Antibody (routine testing w rflx)  (HIV Antibody (Routine testing w reflex) panel)  Once,   R       Question:  Specimen collection method  Answer:  IV Team=IV Team collect   11/12/22 1747   11/12/22 1741  MRSA Next Gen by PCR, Nasal  Once,   URGENT        11/12/22 1740   11/12/22 1741  Group A Strep by PCR  Once,   URGENT        11/12/22 1740   11/12/22 1439  Culture, blood (Routine X 2) w Reflex to ID Panel  BLOOD CULTURE X 2,   R     Question:  Patient immune status  Answer:  Normal   11/12/22 1439   11/12/22 1439  Lactic acid, plasma  Now then every 2 hours,   R      11/12/22 1439            Vitals/Pain Today's Vitals   11/12/22 1630 11/12/22 1645 11/12/22 1730 11/12/22 1745  BP: (!) 132/92 108/86 119/61 101/72  Pulse: (!) 158 (!) 159 (!) 147 90  Resp: (!) 23 (!) 22 (!) 21 17  Temp:      TempSrc:      SpO2: 100% 100% 100% 100%  Weight:       Height:      PainSc:        Isolation Precautions No active isolations  Medications Medications  diltiazem (CARDIZEM) 125 mg in dextrose 5% 125 mL (1 mg/mL) infusion (10 mg/hr Intravenous Rate/Dose Change 11/12/22 1737)  vancomycin (VANCOREADY) IVPB 2000 mg/400 mL (has no administration in time range)  ceFEPIme (MAXIPIME) 2 g in sodium chloride 0.9 % 100 mL IVPB (has no administration in time range)  digoxin (LANOXIN) 0.25 MG/ML injection 0.25 mg (has no administration in time range)  metoprolol tartrate (LOPRESSOR) injection 2.5 mg (has no administration in time range)  vancomycin (VANCOCIN) IVPB 1000 mg/200 mL premix (has no administration in time range)  rifaximin (XIFAXAN) tablet 550 mg (has no administration in time range)  metoprolol tartrate (LOPRESSOR) tablet 25 mg (has no administration in time range)  lactulose (CHRONULAC) 10 GM/15ML solution 20 g (has no administration in time range)  albuterol (PROVENTIL) (2.5 MG/3ML) 0.083% nebulizer solution 2.5 mg (has no administration in time range)  benzonatate (TESSALON) capsule 100 mg (has no administration in time range)  oxyCODONE (Oxy IR/ROXICODONE) immediate release tablet 5 mg (has no administration in time range)  bisacodyl (DULCOLAX) EC tablet 5 mg (has no administration in time range)  ondansetron (ZOFRAN) tablet 4 mg (has no administration in time range)    Or  ondansetron (ZOFRAN) injection 4 mg (has no administration in time range)  insulin aspart (novoLOG) injection 0-15 Units (has no administration in time range)  metoprolol tartrate (LOPRESSOR) injection 5 mg (5 mg Intravenous Given 11/12/22 1544)  metoprolol tartrate (LOPRESSOR) injection 5 mg (5 mg Intravenous Given 11/12/22 1634)    Mobility walks with device     Focused Assessments Cardiac Assessment Handoff:    Lab Results  Component Value Date   TROPONINI <0.03 04/26/2018   Lab Results  Component Value Date   DDIMER 2.64 (H) 03/16/2020   Does the  Patient currently have chest pain? No    R Recommendations: See Admitting Provider Note  Report given to:   Additional Notes:

## 2022-11-13 ENCOUNTER — Encounter (HOSPITAL_COMMUNITY): Payer: Self-pay | Admitting: Internal Medicine

## 2022-11-13 DIAGNOSIS — A419 Sepsis, unspecified organism: Secondary | ICD-10-CM | POA: Diagnosis not present

## 2022-11-13 LAB — BASIC METABOLIC PANEL
Anion gap: 8 (ref 5–15)
BUN: 19 mg/dL (ref 8–23)
CO2: 23 mmol/L (ref 22–32)
Calcium: 8.7 mg/dL — ABNORMAL LOW (ref 8.9–10.3)
Chloride: 106 mmol/L (ref 98–111)
Creatinine, Ser: 0.93 mg/dL (ref 0.44–1.00)
GFR, Estimated: >60 mL/min (ref >60)
Glucose, Bld: 137 mg/dL — ABNORMAL HIGH (ref 70–99)
Potassium: 3.8 mmol/L (ref 3.5–5.1)
Sodium: 137 mmol/L (ref 135–145)

## 2022-11-13 LAB — CBC
HCT: 40.9 % (ref 36.0–46.0)
Hemoglobin: 13.4 g/dL (ref 12.0–15.0)
MCH: 32.7 pg (ref 26.0–34.0)
MCHC: 32.8 g/dL (ref 30.0–36.0)
MCV: 99.8 fL (ref 80.0–100.0)
Platelets: 22 10*3/uL — CL (ref 150–400)
RBC: 4.1 MIL/uL (ref 3.87–5.11)
RDW: 16.2 % — ABNORMAL HIGH (ref 11.5–15.5)
WBC: 6.9 10*3/uL (ref 4.0–10.5)
nRBC: 0 % (ref 0.0–0.2)

## 2022-11-13 LAB — GLUCOSE, CAPILLARY
Glucose-Capillary: 114 mg/dL — ABNORMAL HIGH (ref 70–99)
Glucose-Capillary: 130 mg/dL — ABNORMAL HIGH (ref 70–99)
Glucose-Capillary: 101 mg/dL — ABNORMAL HIGH (ref 70–99)
Glucose-Capillary: 149 mg/dL — ABNORMAL HIGH (ref 70–99)

## 2022-11-13 MED ORDER — DILTIAZEM HCL-DEXTROSE 125-5 MG/125ML-% IV SOLN (PREMIX)
5.0000 mg/h | INTRAVENOUS | Status: DC
  Administered 2022-11-13: 15 mg/h via INTRAVENOUS
  Administered 2022-11-14: 5 mg/h via INTRAVENOUS
  Administered 2022-11-14: 15 mg/h via INTRAVENOUS
  Administered 2022-11-15: 10 mg/h via INTRAVENOUS
  Filled 2022-11-13 (×4): qty 125

## 2022-11-13 MED ORDER — BUMETANIDE 2 MG PO TABS
2.0000 mg | ORAL_TABLET | Freq: Every day | ORAL | Status: DC
  Administered 2022-11-13 – 2022-11-15 (×3): 2 mg via ORAL
  Filled 2022-11-13 (×3): qty 1

## 2022-11-13 MED ORDER — CEFAZOLIN SODIUM-DEXTROSE 2-4 GM/100ML-% IV SOLN
2.0000 g | Freq: Three times a day (TID) | INTRAVENOUS | Status: DC
  Administered 2022-11-13 – 2022-11-15 (×7): 2 g via INTRAVENOUS
  Filled 2022-11-13 (×8): qty 100

## 2022-11-13 NOTE — Progress Notes (Signed)
PROGRESS NOTE    Hannah Khan  [id number] DOB: September 22, 1955 DOA: 11/12/2022 PCP: Lujean Amel, MD  Chief Complaint  Patient presents with   Dizziness    Afib RVR     Brief Narrative:    Hannah Khan is Hannah Khan 67 y.o. female with medical history significant of chronic HFpEF, morbid obesity, OSA, NASH/cirrhosis with chronic thrombocytopenia, IDDM, presented with worsening of rash and pain of abdominal wall.   Symptoms started 7 to 10 days ago with initial rash on the lower abdominal wall then over the last 2 to 3 days the rash spread to most of the lower half of abdominal wall skin with significant tenderness and subjective fever and chills at home.  Today patient developed palpitations and started to feel lightheadedness.  Family called EMS, EMS arrived and found patient in [place]-fib was given Cardizem and 250 mL with normal saline.   ED Course: Temperature 101, patient was found to be in rapid Hannah Khan-fib, started on Cardizem drip and physical exam showed extensive abdominal wall cellulitis and ceftriaxone started.  WBC 11.2, hemoglobin 14 Lactic acid 1.8.  Assessment & Plan:   Principal Problem:   Sepsis ([place]) Active Problems:   Abdominal wall cellulitis   Liver cirrhosis secondary to NASH (nonalcoholic steatohepatitis) (HCC)   Sepsis due to Cellulitis - presented with fever, tachycardia, tachypena - on abdominal wall - low threshold for imaging if not improving or worsening - ancef - blood cultures pending   Hannah Khan with RVR -On Cardizem drip -metop, lower than home dose, adjust as indicated -Given the significant history of cirrhosis and chronic thrombocytopenia, not Hannah Khan candidate for systemic anticoagulation.   Pulmonary Vascular Congestion - continue home bumex  NASH/cirrhosis -resume home bumex -She was cleared by cardiology for TIPS -Continue Xifaxan and lactulose   Morbid obesity -Outpatient bariatric evaluation   IIDM -Switch to sliding scale as she is septic    Chronic thrombocytopenia -Hold off chemical DVT prophylaxis     DVT prophylaxis: SCD's due to thrombocytopenia Code Status: full Family Communication: none Disposition:   Status is: Inpatient Remains inpatient appropriate because: need for IV abx   Consultants:  none  Procedures:  none  Antimicrobials:  Anti-infectives (From admission, onward)    Start     Dose/Rate Route Frequency Ordered Stop   11/13/22 1300  ceFAZolin (ANCEF) IVPB 2g/100 mL premix        2 g 200 mL/hr over 30 Minutes Intravenous Every 8 hours 11/13/22 1106 11/20/22 1359   11/13/22 0700  vancomycin (VANCOCIN) IVPB 1000 mg/200 mL premix  Status:  Discontinued        1,000 mg 200 mL/hr over 60 Minutes Intravenous Every 12 hours 11/12/22 1745 11/13/22 1106   11/12/22 2200  rifaximin (XIFAXAN) tablet 550 mg        550 mg Oral 2 times daily 11/12/22 1747     11/12/22 1745  vancomycin (VANCOREADY) IVPB 2000 mg/400 mL        2,000 mg 200 mL/hr over 120 Minutes Intravenous  Once 11/12/22 1739 11/12/22 2130   11/12/22 1745  ceFEPIme (MAXIPIME) 2 g in sodium chloride 0.9 % 100 mL IVPB  Status:  Discontinued        2 g 200 mL/hr over 30 Minutes Intravenous Every 6 hours 11/12/22 1739 11/13/22 1106   11/12/22 1445  cefTRIAXone (ROCEPHIN) 2 g in sodium chloride 0.9 % 100 mL IVPB  Status:  Discontinued        2 g 200 mL/hr over  30 Minutes Intravenous Every 24 hours 11/12/22 1440 11/12/22 1742       Subjective: C/o burning to belly  Objective: Vitals:   11/12/22 2354 11/13/22 0515 11/13/22 0916 11/13/22 1213  BP: 119/60 131/63 134/62 124/72  Pulse: (!) 114 (!) 119 (!) 112 (!) 111  Resp: '18 20 18 18  '$ Temp: 98 F (36.7 C) 98.5 F (36.9 C) 98 F (36.7 C) 98.2 F (36.8 C)  TempSrc: Oral Oral Oral Oral  SpO2: 90% 98% 97% 97%  Weight:      Height:        Intake/Output Summary (Last 24 hours) at 11/13/2022 1613 Last data filed at 11/13/2022 1220 Gross per 24 hour  Intake 987.97 ml  Output 550 ml  Net  437.97 ml   Filed Weights   11/12/22 1416 11/12/22 2137  Weight: (!) 161.9 kg (!) 158.8 kg    Examination:  General exam: Appears calm and comfortable  Respiratory system: unlabored Cardiovascular system: tachy Gastrointestinal system: extensive erythema to abdomen, no appreciated areas of fluctuance or crepitus Central nervous system: Alert and oriented. No focal neurological deficits. Extremities: no LEE   Data Reviewed: I have personally reviewed following labs and imaging studies  CBC: Recent Labs  Lab 11/12/22 1431 11/13/22 0147  WBC 11.2* 6.9  NEUTROABS 10.3*  --   HGB 14.0 13.4  HCT 40.4 40.9  MCV 97.3 99.8  PLT 27* 22*    Basic Metabolic Panel: Recent Labs  Lab 11/12/22 1431 11/13/22 0147  NA 136 137  K 4.1 3.8  CL 103 106  CO2 24 23  GLUCOSE 137* 137*  BUN 17 19  CREATININE 0.94 0.93  CALCIUM 8.6* 8.7*    GFR: Estimated Creatinine Clearance: 94.4 mL/min (by C-G formula based on SCr of 0.93 mg/dL).  Liver Function Tests: No results for input(s): "AST", "ALT", "ALKPHOS", "BILITOT", "PROT", "ALBUMIN" in the last 168 hours.  CBG: Recent Labs  Lab 11/13/22 0712 11/13/22 1213  GLUCAP 114* 130*     Recent Results (from the past 240 hour(s))  Culture, blood (Routine X 2) w Reflex to ID Panel     Status: None (Preliminary result)   Collection Time: 11/12/22  2:39 PM   Specimen: BLOOD RIGHT ARM  Result Value Ref Range Status   Specimen Description BLOOD RIGHT ARM  Final   Special Requests   Final    BOTTLES DRAWN AEROBIC AND ANAEROBIC Blood Culture results may not be optimal due to an inadequate volume of blood received in culture bottles   Culture   Final    NO GROWTH < 24 HOURS Performed at [facility], [place] [address] [place], [place] [address]    Report Status PENDING  Incomplete  Hannah Khan     Status: None   Collection Time: 11/12/22  5:41 PM   Specimen: Throat; Sterile Swab  Result Value Ref Range Status   Hannah Hannah Khan  Strep by Khan NOT DETECTED NOT DETECTED Final    Comment: Performed at [facility], [place] [address] [place], [place] [address]  MRSA Next Gen by Khan, Nasal     Status: None   Collection Time: 11/12/22  5:46 PM  Result Value Ref Range Status   MRSA by Khan Next Gen NOT DETECTED NOT DETECTED Final    Comment: (NOTE) The GeneXpert MRSA Assay (FDA approved for NASAL specimens only), is one component of Rorey Bisson comprehensive MRSA colonization surveillance program. It is not intended to diagnose MRSA  infection nor to guide or monitor treatment for MRSA infections. Test performance is not FDA approved in patients less than 55 years old. Performed at [facility], [place] [address], [place], [place] [address]   Culture, blood (Routine X 2) w Reflex to ID Panel     Status: None (Preliminary result)   Collection Time: 11/12/22 10:16 PM   Specimen: BLOOD LEFT HAND  Result Value Ref Range Status   Specimen Description BLOOD LEFT HAND  Final   Special Requests   Final    BOTTLES DRAWN AEROBIC AND ANAEROBIC Blood Culture adequate volume   Culture   Final    NO GROWTH < 12 HOURS Performed at [facility], [place] [address], [place], [place] [address]    Report Status PENDING  Incomplete         Radiology Studies: DG Chest Port 1 View  Result Date: 11/12/2022 CLINICAL DATA:  Shortness of breath EXAM: PORTABLE CHEST 1 VIEW COMPARISON:  03/29/2021 FINDINGS: UPPER limits normal heart size noted. Pulmonary vascular congestion is identified. LEFT retrocardiac opacity may be artifactual or represent consolidation/atelectasis. There is no evidence pneumothorax or large pleural effusion. No acute bony abnormalities are noted. IMPRESSION: 1. LEFT retrocardiac opacity which may be artifactual or represent consolidation/atelectasis. Consider PA and LATERAL chest for further evaluation. 2. Pulmonary vascular congestion. Electronically Signed   By: Margarette Canada M.D.   On: 11/12/2022 15:10         Scheduled Meds:  insulin aspart  0-15 Units Subcutaneous TID WC   lactulose  20 g Oral TID   metoprolol tartrate  25 mg Oral BID   rifaximin  550 mg Oral BID   Continuous Infusions:   ceFAZolin (ANCEF) IV 2 g (11/13/22 1220)   diltiazem (CARDIZEM) infusion 5 mg/hr (11/13/22 1318)     LOS: 1 day    Time spent: over 30 min    Fayrene Helper, MD [facility]   To contact the attending provider between 7A-7P or the covering provider during after hours 7P-7A, please log into the web site [email or web address] and access using universal [place] password for that web site. If you do not have the password, please call the hospital operator.  11/13/2022, 4:13 PM

## 2022-11-13 NOTE — Progress Notes (Signed)
MEWS Progress Note  Patient Details Name: Hannah Khan MRN: [id number] DOB: December 03, 1955 Today's Date: 11/13/2022   MEWS Flowsheet Documentation:  Assess: MEWS Score Temp: 98 F (36.7 C) BP: 119/60 MAP (mmHg): 79 Pulse Rate: (!) 114 ECG Heart Rate: (!) 116 Resp: 18 SpO2: 90 % O2 Device: CPAP O2 Flow Rate (L/min): 4 L/min Assess: MEWS Score MEWS Temp: 0 MEWS Systolic: 0 MEWS Pulse: 2 MEWS RR: 0 MEWS LOC: 0 MEWS Score: 2 MEWS Score Color: Yellow Assess: SIRS CRITERIA SIRS Temperature : 0 SIRS Respirations : 0 SIRS Pulse: 1 SIRS WBC: 0 SIRS Score Sum : 1 SIRS Temperature : 0 SIRS Pulse: 1 SIRS Respirations : 1 SIRS WBC: 0 SIRS Score Sum : 2 Assess: if the MEWS score is Yellow or Red Were vital signs taken at a resting state?: Yes Focused Assessment: No change from prior assessment Does the patient meet 2 or more of the SIRS criteria?: Yes Does the patient have a confirmed or suspected source of infection?: Yes Provider and Rapid Response Notified?: No MEWS guidelines implemented : Yes, yellow Treat MEWS Interventions: Considered administering scheduled or prn medications/treatments as ordered Take Vital Signs Increase Vital Sign Frequency : Yellow: Q2hr x1, continue Q4hrs until patient remains green for 12hrs Escalate MEWS: [place]alate: Yellow: Discuss with charge nurse and consider notifying provider and/or RRT Notify: Charge Nurse/RN Name of Charge Nurse/RN Notified: Martyn Ehrich 11/13/2022, 12:54 AM

## 2022-11-14 DIAGNOSIS — A419 Sepsis, unspecified organism: Secondary | ICD-10-CM | POA: Diagnosis not present

## 2022-11-14 LAB — COMPREHENSIVE METABOLIC PANEL
ALT: 18 U/L (ref 0–44)
AST: 32 U/L (ref 15–41)
Albumin: 2.5 g/dL — ABNORMAL LOW (ref 3.5–5.0)
Alkaline Phosphatase: 61 U/L (ref 38–126)
Anion gap: 7 (ref 5–15)
BUN: 18 mg/dL (ref 8–23)
CO2: 23 mmol/L (ref 22–32)
Calcium: 8.3 mg/dL — ABNORMAL LOW (ref 8.9–10.3)
Chloride: 107 mmol/L (ref 98–111)
Creatinine, Ser: 0.87 mg/dL (ref 0.44–1.00)
GFR, Estimated: >60 mL/min (ref >60)
Glucose, Bld: 126 mg/dL — ABNORMAL HIGH (ref 70–99)
Potassium: 3.1 mmol/L — ABNORMAL LOW (ref 3.5–5.1)
Sodium: 137 mmol/L (ref 135–145)
Total Bilirubin: 3.9 mg/dL — ABNORMAL HIGH (ref 0.3–1.2)
Total Protein: 5.4 g/dL — ABNORMAL LOW (ref 6.5–8.1)

## 2022-11-14 LAB — CBC WITH DIFFERENTIAL/PLATELET
Abs Immature Granulocytes: 0.02 10*3/uL (ref 0.00–0.07)
Basophils Absolute: 0 10*3/uL (ref 0.0–0.1)
Basophils Relative: 0 %
Eosinophils Absolute: 0.1 10*3/uL (ref 0.0–0.5)
Eosinophils Relative: 3 %
HCT: 34.7 % — ABNORMAL LOW (ref 36.0–46.0)
Hemoglobin: 11.7 g/dL — ABNORMAL LOW (ref 12.0–15.0)
Immature Granulocytes: 1 %
Lymphocytes Relative: 10 %
Lymphs Abs: 0.3 10*3/uL — ABNORMAL LOW (ref 0.7–4.0)
MCH: 32.4 pg (ref 26.0–34.0)
MCHC: 33.7 g/dL (ref 30.0–36.0)
MCV: 96.1 fL (ref 80.0–100.0)
Monocytes Absolute: 0.4 10*3/uL (ref 0.1–1.0)
Monocytes Relative: 12 %
Neutro Abs: 2.2 10*3/uL (ref 1.7–7.7)
Neutrophils Relative %: 74 %
Platelets: UNDETERMINED 10*3/uL (ref 150–400)
RBC: 3.61 MIL/uL — ABNORMAL LOW (ref 3.87–5.11)
RDW: 15.7 % — ABNORMAL HIGH (ref 11.5–15.5)
WBC: 2.9 10*3/uL — ABNORMAL LOW (ref 4.0–10.5)
nRBC: 0 % (ref 0.0–0.2)

## 2022-11-14 LAB — CBC
HCT: 38.4 % (ref 36.0–46.0)
Hemoglobin: 12.8 g/dL (ref 12.0–15.0)
MCH: 32.1 pg (ref 26.0–34.0)
MCHC: 33.3 g/dL (ref 30.0–36.0)
MCV: 96.2 fL (ref 80.0–100.0)
Platelets: 26 10*3/uL — CL (ref 150–400)
RBC: 3.99 MIL/uL (ref 3.87–5.11)
RDW: 15.6 % — ABNORMAL HIGH (ref 11.5–15.5)
WBC: 4.4 10*3/uL (ref 4.0–10.5)
nRBC: 0 % (ref 0.0–0.2)

## 2022-11-14 LAB — GLUCOSE, CAPILLARY
Glucose-Capillary: 100 mg/dL — ABNORMAL HIGH (ref 70–99)
Glucose-Capillary: 111 mg/dL — ABNORMAL HIGH (ref 70–99)
Glucose-Capillary: 143 mg/dL — ABNORMAL HIGH (ref 70–99)
Glucose-Capillary: 117 mg/dL — ABNORMAL HIGH (ref 70–99)

## 2022-11-14 LAB — HEMOGLOBIN A1C
Hgb A1c MFr Bld: 4.7 % — ABNORMAL LOW (ref 4.8–5.6)
Mean Plasma Glucose: 88 mg/dL

## 2022-11-14 LAB — MAGNESIUM: Magnesium: 1.8 mg/dL (ref 1.7–2.4)

## 2022-11-14 LAB — PHOSPHORUS: Phosphorus: 2.5 mg/dL (ref 2.5–4.6)

## 2022-11-14 MED ORDER — METOPROLOL TARTRATE 50 MG PO TABS
50.0000 mg | ORAL_TABLET | Freq: Two times a day (BID) | ORAL | Status: DC
  Administered 2022-11-14 – 2022-11-15 (×2): 50 mg via ORAL
  Filled 2022-11-14 (×2): qty 1

## 2022-11-14 MED ORDER — POTASSIUM CHLORIDE CRYS ER 20 MEQ PO TBCR
40.0000 meq | EXTENDED_RELEASE_TABLET | ORAL | Status: AC
  Administered 2022-11-14 (×2): 40 meq via ORAL
  Filled 2022-11-14 (×2): qty 2

## 2022-11-14 NOTE — Progress Notes (Signed)
Pt. Has home cpap. No issues at this time.

## 2022-11-14 NOTE — Evaluation (Signed)
Occupational Therapy Evaluation Patient Details Name: Hannah Khan MRN: [id number] DOB: 05/05/1956 Today's Date: 11/14/2022   History of Present Illness Pt is a 67 y/o F presenting to ED on 2/24 with dizziness, rash and abdominal wall pain. Admitted for sepisis due to cellulitis. PMH includes chronic A fib not on anticoagulation due to risk of GI bleed and thrombocytopenia, HFpEF, morbid obesity, OSA, NASH/cirrhosis, IDDM.   Clinical Impression   Pt reports needing assist at baseline for LB ADLs, does not drive, and uses rollator for all mobility. Pt lives with spouse who typically assists as needed, but pt reports he is currently sick. Pt needing supervision - mod A for ADLs, supervision for bed mobility, and min guard A for transfers with RW. HR ~110-120bpm with mobility. Pt presenting with impairments listed below, will follow acutely. Recommend HHOT at d/c pending progression.      Recommendations for follow up therapy are one component of a multi-disciplinary discharge planning process, led by the attending physician.  Recommendations may be updated based on patient status, additional functional criteria and insurance authorization.   Follow Up Recommendations  Home health OT     Assistance Recommended at Discharge Intermittent Supervision/Assistance  Patient can return home with the following Direct supervision/assist for medications management;Direct supervision/assist for financial management;Help with stairs or ramp for entrance;Assist for transportation;A lot of help with bathing/dressing/bathroom    Functional Status Assessment  Patient has had a recent decline in their functional status and demonstrates the ability to make significant improvements in function in a reasonable and predictable amount of time.  Equipment Recommendations  BSC/3in1    Recommendations for Other Services PT consult     Precautions / Restrictions Precautions Precautions:  Fall Restrictions Weight Bearing Restrictions: No      Mobility Bed Mobility Overal bed mobility: Needs Assistance Bed Mobility: Supine to Sit     Supine to sit: Supervision          Transfers Overall transfer level: Needs assistance Equipment used: Rolling walker (2 wheels) Transfers: Sit to/from Stand Sit to Stand: Min guard                  Balance Overall balance assessment: Needs assistance Sitting-balance support: Feet supported Sitting balance-Leahy Scale: Good     Standing balance support: During functional activity, Reliant on assistive device for balance Standing balance-Leahy Scale: Fair Standing balance comment: can static stand without AD, reliant on RW for ambulation                           ADL either performed or assessed with clinical judgement   ADL Overall ADL's : Needs assistance/impaired Eating/Feeding: Supervision/ safety   Grooming: Supervision/safety;Standing   Upper Body Bathing: Minimal assistance;Sitting   Lower Body Bathing: Moderate assistance;Sitting/lateral leans;Sit to/from stand   Upper Body Dressing : Minimal assistance;Sitting   Lower Body Dressing: Moderate assistance;Sitting/lateral leans;Sit to/from stand   Toilet Transfer: Min guard;Ambulation;Rolling walker (2 wheels);Regular Toilet   Toileting- Clothing Manipulation and Hygiene: Maximal assistance Toileting - Clothing Manipulation Details (indicate cue type and reason): pulling up briefs     Functional mobility during ADLs: Minimal assistance;Rolling walker (2 wheels)       Vision Baseline Vision/History: 1 Wears glasses Vision Assessment?: No apparent visual deficits     Perception Perception Perception Tested?: No   Praxis Praxis Praxis tested?: Not tested    Pertinent Vitals/Pain Pain Assessment Pain Assessment: Faces Pain Score: 4  Faces Pain  Scale: Hurts little more Pain Location: L jaw and R neck pain Pain Descriptors /  Indicators: Discomfort     Hand Dominance Right   Extremity/Trunk Assessment Upper Extremity Assessment Upper Extremity Assessment: Generalized weakness (limited RUE AROM due to prior shoulder injury (2019))   Lower Extremity Assessment Lower Extremity Assessment: Defer to PT evaluation   Cervical / Trunk Assessment Cervical / Trunk Assessment: Other exceptions (body habitus)   Communication Communication Communication: No difficulties   Cognition Arousal/Alertness: Awake/alert Behavior During Therapy: WFL for tasks assessed/performed Overall Cognitive Status: No family/caregiver present to determine baseline cognitive functioning                                 General Comments: pt did not call when soiled, is A & Ox4, able to provide PLOF appropriately when compared to prior admission     General Comments  HR up to 112bpm with mobility, SpO2 reading 98% on RA at end of session    Exercises     Shoulder Instructions      Home Living Family/patient expects to be discharged to:: Private residence Living Arrangements: Spouse/significant other Available Help at Discharge: Family;Available PRN/intermittently Type of Home: House Home Access: Stairs to enter [employer] of Steps: 1-2   Home Layout: One level     Bathroom Shower/Tub: Teacher, early years/pre: Standard Bathroom Accessibility: Yes How Accessible: Accessible via walker (reports everywhere except for guest bathroom) Home Equipment: Rollator (4 wheels);Tub bench   Additional Comments: uses CPAP at night      Prior Functioning/Environment Prior Level of Function : Independent/Modified Independent             Mobility Comments: uses rollator ADLs Comments: does not drive, assist for LB ADLs        OT Problem List: Decreased strength;Decreased range of motion;Decreased activity tolerance;Impaired balance (sitting and/or standing)      OT  Treatment/Interventions: Self-care/ADL training;Therapeutic exercise;DME and/or AE instruction;Therapeutic activities;Balance training;Patient/family education    OT Goals(Current goals can be found in the care plan section) Acute Rehab OT Goals Patient Stated Goal: none stated OT Goal Formulation: With patient Time For Goal Achievement: 11/28/22 Potential to Achieve Goals: Good ADL Goals Pt Will Perform Upper Body Dressing: with modified independence;sitting Pt Will Perform Lower Body Dressing: with min assist;sitting/lateral leans;sit to/from stand Pt Will Transfer to Toilet: with supervision;ambulating;regular height toilet Pt Will Perform Tub/Shower Transfer: Tub transfer;Shower transfer;tub bench;with supervision;ambulating  OT Frequency: Min 2X/week    Co-evaluation              AM-PAC OT "6 Clicks" Daily Activity     Outcome Measure Help from another person eating meals?: None Help from another person taking care of personal grooming?: None Help from another person toileting, which includes using toliet, bedpan, or urinal?: A Lot Help from another person bathing (including washing, rinsing, drying)?: A Lot Help from another person to put on and taking off regular upper body clothing?: A Little Help from another person to put on and taking off regular lower body clothing?: A Lot 6 Click Score: 17   End of Session Equipment Utilized During Treatment: Gait belt;Rolling walker (2 wheels) Nurse Communication: Mobility status  Activity Tolerance: Patient tolerated treatment well Patient left: in chair;with call bell/phone within reach;with chair alarm set  OT Visit Diagnosis: Unsteadiness on feet (R26.81);Other abnormalities of gait and mobility (R26.89);Muscle weakness (generalized) (M62.81);History of falling (Z91.81)  Time: [id number] OT Time Calculation (min): 33 min Charges:  OT General Charges $OT Visit: 1 Visit OT Evaluation $OT Eval Moderate  Complexity: 1 Mod OT Treatments $Self Care/Home Management : 8-22 mins  Renaye Rakers, OTD, OTR/L [employer] Preferred Acute Rehab [phone]   Ulla Gallo 11/14/2022, 3:19 PM

## 2022-11-14 NOTE — Evaluation (Signed)
Physical Therapy Evaluation Patient Details Name: Hannah Khan MRN: [id number] DOB: 1956/08/24 Today's Date: 11/14/2022  History of Present Illness  Pt is a 67 y/o F presenting to ED on 2/24 with dizziness, rash and abdominal wall pain. Admitted for sepisis due to cellulitis. PMH includes chronic A fib not on anticoagulation due to risk of GI bleed and thrombocytopenia, HFpEF, morbid obesity, OSA, NASH/cirrhosis, IDDM.   Clinical Impression  Pt in bed upon arrival of PT, agreeable to evaluation at this time. Prior to admission the pt was mobilizing with uyse of rollator in the home, living with spouse who assists as needed with ADLs. The pt now presents with limitations in functional mobility, power, and endurance due to above dx, and will continue to benefit from skilled PT to address these deficits. The pt was limited to short distance ambulation in the room with use of RW due to HR elevation to 140s with exertion. HR recovered to 120s with standing rest but required seated rest to recover <120bpm. Pt agreeable to additional visit to progress endurance and establish exercise program she can complete at home to improve power in BLE and functional endurance to allow for safe mobility in the home.         Recommendations for follow up therapy are one component of a multi-disciplinary discharge planning process, led by the attending physician.  Recommendations may be updated based on patient status, additional functional criteria and insurance authorization.  Follow Up Recommendations Home health PT      Assistance Recommended at Discharge Frequent or constant Supervision/Assistance  Patient can return home with the following  A little help with walking and/or transfers;A little help with bathing/dressing/bathroom;Assistance with cooking/housework;Assist for transportation;Help with stairs or ramp for entrance    Equipment Recommendations None recommended by PT  Recommendations for Other  Services       Functional Status Assessment Patient has had a recent decline in their functional status and demonstrates the ability to make significant improvements in function in a reasonable and predictable amount of time.     Precautions / Restrictions Precautions Precautions: Fall Precaution Comments: watch HR Restrictions Weight Bearing Restrictions: No      Mobility  Bed Mobility Overal bed mobility: Needs Assistance             General bed mobility comments: pt OOB in chair at start and end of session    Transfers Overall transfer level: Needs assistance Equipment used: Rolling walker (2 wheels) Transfers: Sit to/from Stand Sit to Stand: Min guard           General transfer comment: minG both with and without RW, no overt LOB    Ambulation/Gait Ambulation/Gait assistance: Min guard Gait Distance (Feet): 25 Feet Assistive device: Rolling walker (2 wheels) Gait Pattern/deviations: Step-through pattern, Decreased stride length Gait velocity: decreased Gait velocity interpretation: <1.31 ft/sec, indicative of household ambulator   General Gait Details: small steps with minimal stride length. HR increased to 140s with 25 ft ambulation in room, single standing rest break      Balance Overall balance assessment: Needs assistance Sitting-balance support: Feet supported Sitting balance-Leahy Scale: Good     Standing balance support: During functional activity, Reliant on assistive device for balance Standing balance-Leahy Scale: Fair Standing balance comment: can static stand without AD, reliant on RW for ambulation                             Pertinent  Vitals/Pain Pain Assessment Pain Assessment: Faces Faces Pain Scale: Hurts a little bit Pain Location: L jaw and R neck pain Pain Descriptors / Indicators: Discomfort Pain Intervention(s): Monitored during session    Home Living Family/patient expects to be discharged to:: Private  residence Living Arrangements: Spouse/significant other Available Help at Discharge: Family;Available PRN/intermittently Type of Home: House Home Access: Stairs to enter   Entrance Stairs-Number of Steps: 1-2   Home Layout: One level Home Equipment: Rollator (4 wheels);Tub bench Additional Comments: uses CPAP at night    Prior Function Prior Level of Function : Independent/Modified Independent             Mobility Comments: uses rollator, limited to household ambulation or will use power chair in community. ADLs Comments: does not drive, assist for LB ADLs     Hand Dominance   Dominant Hand: Right    Extremity/Trunk Assessment   Upper Extremity Assessment Upper Extremity Assessment: Defer to OT evaluation    Lower Extremity Assessment Lower Extremity Assessment: Generalized weakness (bilateral LE edema with some coloring of distal extremities. pt able to demo good strength to MMT but limited endurance)    Cervical / Trunk Assessment Cervical / Trunk Assessment: Other exceptions Cervical / Trunk Exceptions: large body habitus  Communication   Communication: No difficulties  Cognition Arousal/Alertness: Awake/alert Behavior During Therapy: WFL for tasks assessed/performed Overall Cognitive Status: Within Functional Limits for tasks assessed                                 General Comments: able to answer questions appropriately, A and O x4        General Comments General comments (skin integrity, edema, etc.): HR to 142bpm with ambulation in room        Assessment/Plan    PT Assessment Patient needs continued PT services  PT Problem List Cardiopulmonary status limiting activity;Decreased activity tolerance;Decreased balance       PT Treatment Interventions DME instruction;Gait training;Functional mobility training;Therapeutic activities;Therapeutic exercise;Balance training;Patient/family education    PT Goals (Current goals can be found  in the Care Plan section)  Acute Rehab PT Goals Patient Stated Goal: return home PT Goal Formulation: With patient Time For Goal Achievement: 11/28/22 Potential to Achieve Goals: Good    Frequency Min 3X/week        AM-PAC PT "6 Clicks" Mobility  Outcome Measure Help needed turning from your back to your side while in a flat bed without using bedrails?: A Little Help needed moving from lying on your back to sitting on the side of a flat bed without using bedrails?: A Little Help needed moving to and from a bed to a chair (including a wheelchair)?: None Help needed standing up from a chair using your arms (e.g., wheelchair or bedside chair)?: None Help needed to walk in hospital room?: A Little Help needed climbing 3-5 steps with a railing? : A Little 6 Click Score: 20    End of Session   Activity Tolerance: Patient limited by fatigue;Other (comment) (HR elevation) Patient left: in chair;with call bell/phone within reach;with family/visitor present Nurse Communication: Mobility status (HR elevation) PT Visit Diagnosis: Other abnormalities of gait and mobility (R26.89);Muscle weakness (generalized) (M62.81)    Time: [id number] PT Time Calculation (min) (ACUTE ONLY): 12 min   Charges:   PT Evaluation $PT Eval Low Complexity: 1 Low          West Carbo, PT, DPT  Acute Rehabilitation Department Office [place] Communication Preferred  Sandra Cockayne 11/14/2022, 4:49 PM

## 2022-11-14 NOTE — Progress Notes (Signed)
PROGRESS NOTE    Hannah Khan  [id number] DOB: [birth year] DOA: 11/12/2022 PCP: Hannah Amel, MD  Chief Complaint  Patient presents with   Dizziness    Afib RVR     Brief Narrative:    Hannah Khan is Hannah Khan 67 y.o. female with medical history significant of chronic HFpEF, morbid obesity, OSA, NASH/cirrhosis with chronic thrombocytopenia, IDDM, presented with worsening of rash and pain of abdominal wall.   Symptoms started 7 to 10 days ago with initial rash on the lower abdominal wall then over the last 2 to 3 days the rash spread to most of the lower half of abdominal wall skin with significant tenderness and subjective fever and chills at home.  Today patient developed palpitations and started to feel lightheadedness.  Family called EMS, EMS arrived and found patient in Hannah Khan-fib was given Cardizem and 250 mL with normal saline.   ED Course: Temperature 101, patient was found to be in rapid [name], started on Cardizem drip and physical exam showed extensive abdominal wall cellulitis and ceftriaxone started.  WBC 11.2, hemoglobin 14 Lactic acid 1.8.  Assessment & Plan:   Principal Problem:   Sepsis (Hannah Khan) Active Problems:   Abdominal wall cellulitis   Liver cirrhosis secondary to NASH (nonalcoholic steatohepatitis) (HCC)   Sepsis due to Cellulitis presented with fever, tachycardia, tachypena on abdominal wall low threshold for imaging if not improving or worsening -ancef 2/25 - present -blood cultures NGx2   Hannah Khan-fib with RVR On Cardizem drip metop, lower than home dose, adjust as indicated - uptitrate as tolerated Given the significant history of cirrhosis and chronic thrombocytopenia, not Hannah Khan candidate for systemic anticoagulation.   Pulmonary Vascular Congestion continue home bumex  NASH/cirrhosis resume home bumex She was cleared by cardiology for TIPS Continue Xifaxan and lactulose   Morbid obesity Outpatient bariatric evaluation   IIDM Switch to sliding scale as  she is septic   Chronic thrombocytopenia Hold off chemical DVT prophylaxis Will trend   Obesity Body mass index is 54.83 kg/m.  Musculoskeletal Neck Pain Heating pack      DVT prophylaxis: SCD's due to thrombocytopenia Code Status: full Family Communication: none Disposition:   Status is: Inpatient Remains inpatient appropriate because: need for IV abx   Consultants:  none  Procedures:  none  Antimicrobials:  Anti-infectives (From admission, onward)    Start     Dose/Rate Route Frequency Ordered Stop   11/13/22 1300  ceFAZolin (ANCEF) IVPB 2g/100 mL premix        2 g 200 mL/hr over 30 Minutes Intravenous Every 8 hours 11/13/22 1106 11/20/22 1359   11/13/22 0700  vancomycin (VANCOCIN) IVPB 1000 mg/200 mL premix  Status:  Discontinued        1,000 mg 200 mL/hr over 60 Minutes Intravenous Every 12 hours 11/12/22 1745 11/13/22 1106   11/12/22 2200  rifaximin (XIFAXAN) tablet 550 mg        550 mg Oral 2 times daily 11/12/22 1747     11/12/22 1745  vancomycin (VANCOREADY) IVPB 2000 mg/400 mL        2,000 mg 200 mL/hr over 120 Minutes Intravenous  Once 11/12/22 1739 11/12/22 2130   11/12/22 1745  ceFEPIme (MAXIPIME) 2 g in sodium chloride 0.9 % 100 mL IVPB  Status:  Discontinued        2 g 200 mL/hr over 30 Minutes Intravenous Every 6 hours 11/12/22 1739 11/13/22 1106   11/12/22 1445  cefTRIAXone (ROCEPHIN) 2 g in sodium chloride 0.9 %  100 mL IVPB  Status:  Discontinued        2 g 200 mL/hr over 30 Minutes Intravenous Every 24 hours 11/12/22 1440 11/12/22 1742       Subjective: C/o sore throat due to dry O2 Neck soreness  Objective: Vitals:   11/14/22 0337 11/14/22 0725 11/14/22 0801 11/14/22 1212  BP: 120/62 124/62 127/67 106/61  Pulse: (!) 114 (!) 112 (!) 109 93  Resp: '19 20 16 19  '$ Temp: 97.6 F (36.4 C) 97.8 F (36.6 C) 97.6 F (36.4 C) 97.6 F (36.4 C)  TempSrc: Oral Oral Oral Oral  SpO2:  96% 97% 96%  Weight:      Height:        Intake/Output  Summary (Last 24 hours) at 11/14/2022 1235 Last data filed at 11/14/2022 0300 Gross per 24 hour  Intake 489.84 ml  Output 1850 ml  Net -1360.16 ml   Filed Weights   11/12/22 1416 11/12/22 2137  Weight: (!) 161.9 kg (!) 158.8 kg    Examination:  General: No acute distress. Cardiovascular: irregularly irregular, tachy Lungs: unlabored Abdomen: Soft, nontender, nondistended  Neurological: Alert and oriented 3. Moves all extremities 4 with equal strength. Cranial nerves II through XII grossly intact. Skin: erythema to abdomen, no fluctuance/crepitus - exam limited by habitus, but no elicited pain Extremities: No clubbing or cyanosis. No edema.  Data Reviewed: I have personally reviewed following labs and imaging studies  CBC: Recent Labs  Lab 11/12/22 1431 11/13/22 0147 11/14/22 0139  WBC 11.2* 6.9 2.9*  NEUTROABS 10.3*  --  2.2  HGB 14.0 13.4 11.7*  HCT 40.4 40.9 34.7*  MCV 97.3 99.8 96.1  PLT 27* 22* PLATELET CLUMPS NOTED ON SMEAR, UNABLE TO ESTIMATE    Basic Metabolic Panel: Recent Labs  Lab 11/12/22 1431 11/13/22 0147 11/14/22 0139  NA 136 137 137  K 4.1 3.8 3.1*  CL 103 106 107  CO2 '24 23 23  '$ GLUCOSE 137* 137* 126*  BUN '17 19 18  '$ CREATININE 0.94 0.93 0.87  CALCIUM 8.6* 8.7* 8.3*  MG  --   --  1.8  PHOS  --   --  2.5    GFR: Estimated Creatinine Clearance: 100.9 mL/min (by C-G formula based on SCr of 0.87 mg/dL).  Liver Function Tests: Recent Labs  Lab 11/14/22 0139  AST 32  ALT 18  ALKPHOS 61  BILITOT 3.9*  PROT 5.4*  ALBUMIN 2.5*    CBG: Recent Labs  Lab 11/13/22 1213 11/13/22 1631 11/13/22 2116 11/14/22 0724 11/14/22 1210  GLUCAP 130* 101* 149* 100* 111*     Recent Results (from the past 240 hour(s))  Culture, blood (Routine X 2) w Reflex to ID Panel     Status: None (Preliminary result)   Collection Time: 11/12/22  2:39 PM   Specimen: BLOOD RIGHT ARM  Result Value Ref Range Status   Specimen Description BLOOD RIGHT ARM  Final    Special Requests   Final    BOTTLES DRAWN AEROBIC AND ANAEROBIC Blood Culture results may not be optimal due to an inadequate volume of blood received in culture bottles   Culture   Final    NO GROWTH 2 DAYS Performed at [facility], [place] [address], [place], [place] [address]    Report Status PENDING  Incomplete  Group Shawntrice Salle Strep by PCR     Status: None   Collection Time: 11/12/22  5:41 PM   Specimen: Throat; Sterile Swab  Result Value Ref Range  Status   Group Tajah Schreiner Strep by PCR NOT DETECTED NOT DETECTED Final    Comment: Performed at [facility], [place] [address], [place], [place] [address]  MRSA Next Gen by PCR, Nasal     Status: None   Collection Time: 11/12/22  5:46 PM  Result Value Ref Range Status   MRSA by PCR Next Gen NOT DETECTED NOT DETECTED Final    Comment: (NOTE) The GeneXpert MRSA Assay (FDA approved for NASAL specimens only), is one component of [facility] comprehensive MRSA colonization surveillance program. It is not intended to diagnose MRSA infection nor to guide or monitor treatment for MRSA infections. Test performance is not FDA approved in patients less than 12 years old. Performed at [facility], [place] [address] [place], [place] [address]   Culture, blood (Routine X 2) w Reflex to ID Panel     Status: None (Preliminary result)   Collection Time: 11/12/22 10:16 PM   Specimen: BLOOD LEFT HAND  Result Value Ref Range Status   Specimen Description BLOOD LEFT HAND  Final   Special Requests   Final    BOTTLES DRAWN AEROBIC AND ANAEROBIC Blood Culture adequate volume   Culture   Final    NO GROWTH 2 DAYS Performed at [facility], [place] [address] [place], [place] [address]    Report Status PENDING  Incomplete         Radiology Studies: DG Chest Port 1 View  Result Date: 11/12/2022 CLINICAL DATA:  Shortness of breath EXAM: PORTABLE CHEST 1 VIEW COMPARISON:  03/29/2021 FINDINGS: UPPER limits normal heart size noted. Pulmonary vascular  congestion is identified. LEFT retrocardiac opacity may be artifactual or represent consolidation/atelectasis. There is no evidence pneumothorax or large pleural effusion. No acute bony abnormalities are noted. IMPRESSION: 1. LEFT retrocardiac opacity which may be artifactual or represent consolidation/atelectasis. Consider PA and LATERAL chest for further evaluation. 2. Pulmonary vascular congestion. Electronically Signed   By: Margarette Canada M.D.   On: 11/12/2022 15:10        Scheduled Meds:  bumetanide  2 mg Oral Daily   insulin aspart  0-15 Units Subcutaneous TID WC   lactulose  20 g Oral TID   metoprolol tartrate  25 mg Oral BID   potassium chloride  40 mEq Oral Q4H   rifaximin  550 mg Oral BID   Continuous Infusions:   ceFAZolin (ANCEF) IV 2 g (11/14/22 [id number])   diltiazem (CARDIZEM) infusion 15 mg/hr (11/14/22 0607)     LOS: 2 days    Time spent: over 30 min    Fayrene Helper, MD [facility]   To contact the attending provider between 7A-7P or the covering provider during after hours 7P-7A, please log into the web site [email or web address] and access using universal [place] password for that web site. If you do not have the password, please call the hospital operator.  11/14/2022, 12:35 PM

## 2022-11-14 NOTE — Plan of Care (Signed)

## 2022-11-15 DIAGNOSIS — A419 Sepsis, unspecified organism: Secondary | ICD-10-CM | POA: Diagnosis not present

## 2022-11-15 LAB — GLUCOSE, CAPILLARY
Glucose-Capillary: 135 mg/dL — ABNORMAL HIGH (ref 70–99)
Glucose-Capillary: 139 mg/dL — ABNORMAL HIGH (ref 70–99)
Glucose-Capillary: 159 mg/dL — ABNORMAL HIGH (ref 70–99)

## 2022-11-15 LAB — CBC WITH DIFFERENTIAL/PLATELET
Abs Immature Granulocytes: 0.01 10*3/uL (ref 0.00–0.07)
Basophils Absolute: 0 10*3/uL (ref 0.0–0.1)
Basophils Relative: 1 %
Eosinophils Absolute: 0.1 10*3/uL (ref 0.0–0.5)
Eosinophils Relative: 3 %
HCT: 34.1 % — ABNORMAL LOW (ref 36.0–46.0)
Hemoglobin: 11.8 g/dL — ABNORMAL LOW (ref 12.0–15.0)
Immature Granulocytes: 0 %
Lymphocytes Relative: 13 %
Lymphs Abs: 0.5 10*3/uL — ABNORMAL LOW (ref 0.7–4.0)
MCH: 32.8 pg (ref 26.0–34.0)
MCHC: 34.6 g/dL (ref 30.0–36.0)
MCV: 94.7 fL (ref 80.0–100.0)
Monocytes Absolute: 0.5 10*3/uL (ref 0.1–1.0)
Monocytes Relative: 13 %
Neutro Abs: 2.5 10*3/uL (ref 1.7–7.7)
Neutrophils Relative %: 70 %
Platelets: 23 10*3/uL — CL (ref 150–400)
RBC: 3.6 MIL/uL — ABNORMAL LOW (ref 3.87–5.11)
RDW: 15.7 % — ABNORMAL HIGH (ref 11.5–15.5)
WBC: 3.6 10*3/uL — ABNORMAL LOW (ref 4.0–10.5)
nRBC: 0 % (ref 0.0–0.2)

## 2022-11-15 LAB — PROTIME-INR
INR: 1.4 — ABNORMAL HIGH (ref 0.8–1.2)
Prothrombin Time: 16.8 seconds — ABNORMAL HIGH (ref 11.4–15.2)

## 2022-11-15 LAB — COMPREHENSIVE METABOLIC PANEL
ALT: 19 U/L (ref 0–44)
AST: 33 U/L (ref 15–41)
Albumin: 2.4 g/dL — ABNORMAL LOW (ref 3.5–5.0)
Alkaline Phosphatase: 62 U/L (ref 38–126)
Anion gap: 7 (ref 5–15)
BUN: 19 mg/dL (ref 8–23)
CO2: 26 mmol/L (ref 22–32)
Calcium: 8.3 mg/dL — ABNORMAL LOW (ref 8.9–10.3)
Chloride: 102 mmol/L (ref 98–111)
Creatinine, Ser: 0.79 mg/dL (ref 0.44–1.00)
GFR, Estimated: >60 mL/min (ref >60)
Glucose, Bld: 126 mg/dL — ABNORMAL HIGH (ref 70–99)
Potassium: 3.5 mmol/L (ref 3.5–5.1)
Sodium: 135 mmol/L (ref 135–145)
Total Bilirubin: 3.9 mg/dL — ABNORMAL HIGH (ref 0.3–1.2)
Total Protein: 5.5 g/dL — ABNORMAL LOW (ref 6.5–8.1)

## 2022-11-15 LAB — MAGNESIUM: Magnesium: 1.8 mg/dL (ref 1.7–2.4)

## 2022-11-15 LAB — PHOSPHORUS: Phosphorus: 2.4 mg/dL — ABNORMAL LOW (ref 2.5–4.6)

## 2022-11-15 MED ORDER — CEFADROXIL 500 MG PO CAPS: 500.0000 mg | ORAL_CAPSULE | Freq: Two times a day (BID) | ORAL | 0 refills | Status: AC

## 2022-11-15 MED ORDER — DICLOFENAC SODIUM 1 % EX GEL
2.0000 g | Freq: Four times a day (QID) | CUTANEOUS | Status: DC
  Administered 2022-11-15 (×2): 2 g via TOPICAL
  Filled 2022-11-15: qty 100

## 2022-11-15 MED ORDER — DILTIAZEM HCL ER COATED BEADS 180 MG PO CP24
180.0000 mg | ORAL_CAPSULE | Freq: Every day | ORAL | Status: DC
  Administered 2022-11-15: 180 mg via ORAL
  Filled 2022-11-15: qty 1

## 2022-11-15 NOTE — TOC Initial Note (Signed)
Transition of Care [facility]) - Initial/Assessment Note    Patient Details  Name: Hannah Khan MRN: [id number] Date of Birth: January 14, 1956  Transition of Care [facility]) CM/SW Contact:    Bethena Roys, RN Phone Number: 11/15/2022, 2:58 PM  Clinical Narrative: Patient presented for worsening of rash and pain of abdominal wall. Treated for cellulitis with IV antibiotics. Plan for patient to transition home today with home health services. Case Manager discussed services with the patient and she wants to use [facility]. Referral submitted and start of care to begin within 24-48 hours post transition home. No DME needs identified. Spouse will provide transportation home.                  Expected Discharge Plan: [place] Barriers to Discharge: No Barriers Identified   Patient Goals and CMS Choice Patient states their goals for this hospitalization and ongoing recovery are:: to return home.   Choice offered to / list presented to : NA (Patient has used [facility] in the past and wants to use again.)      Expected Discharge Plan and Services In-house Referral: NA Discharge Planning Services: CM Consult Post Acute Care Choice: Home Health Living arrangements for the past 2 months: Single Family Home Expected Discharge Date: 11/15/22               DME Arranged: N/A         HH Arranged: PT, Nurse's Aide, OT HH Agency: [facility] Agency Contacted: 11/15/22 Time [place]: [phone] Representative spoke with at [place]: Tommi Rumps  Prior Living Arrangements/Services Living arrangements for the past 2 months: [place] with:: Spouse Patient language and need for interpreter reviewed:: Yes Do you feel safe going back to the place where you live?: Yes      Need for Family Participation in Patient Care: Yes (Comment) Care giver support system in place?: Yes (comment) Current home services: DME (rollator, bedside commode, transfer  chair, and transfer bench) Criminal Activity/Legal Involvement Pertinent to Current Situation/Hospitalization: No - Comment as needed  Activities of Daily Living Home Assistive Devices/Equipment: Environmental consultant (specify type) (rollator) ADL Screening (condition at time of admission) Patient's cognitive ability adequate to safely complete daily activities?: Yes Is the patient deaf or have difficulty hearing?: No Does the patient have difficulty seeing, even when wearing glasses/contacts?: No Does the patient have difficulty concentrating, remembering, or making decisions?: No Patient able to express need for assistance with ADLs?: Yes Does the patient have difficulty dressing or bathing?: No Independently performs ADLs?: Yes (appropriate for developmental age) Does the patient have difficulty walking or climbing stairs?: No Weakness of Legs: None Weakness of Arms/Hands: None  Permission Sought/Granted Permission sought to share information with : Case Manager, Customer service manager, Family Supports       Permission granted to share info w AGENCY: Nurse, learning disability        Emotional Assessment Appearance:: Appears stated age Attitude/Demeanor/Rapport: Engaged Affect (typically observed): Appropriate Orientation: : Oriented to Self, Oriented to Place, Oriented to  Time, Oriented to Situation Alcohol / Substance Use: Not Applicable Psych Involvement: No (comment)  Admission diagnosis:  Sepsis ([place]) [A41.9] Atrial fibrillation, unspecified type ([place]) [I48.91] Cellulitis, unspecified cellulitis site [L03.90] Patient Active Problem List   Diagnosis Date Noted   Acute encephalopathy 01/10/2021   Secondary hypercoagulable state ([place]) 06/23/2020   Streptococcal bacteremia 03/16/2020   Chronic venous stasis dermatitis of both lower extremities 03/16/2020   Acute lower  UTI 04/26/2018   Liver cirrhosis secondary to NASH (nonalcoholic steatohepatitis) ([place]) 04/26/2018   Hyperlipidemia 04/26/2018    Type 2 diabetes mellitus with other specified complication ([place])    Cellulitis of right lower extremity 03/15/2018   Sepsis ([place]) 03/14/2018   Community acquired pneumonia of right lower lobe of lung 10/31/2016   Muscular abdominal pain in right upper quadrant 07/11/2016   Chronic diastolic heart failure ([place]) 07/10/2016   Thrombocytopenia ([place]) 07/10/2016   Permanent atrial fibrillation ([place]) 01/30/2016   Type 2 diabetes mellitus without complication, without long-term current use of insulin ([place]) 01/30/2016   Morbid obesity- 12/10/2015   Abdominal wall cellulitis    Melena 01/31/2015   Aortic heart murmur 01/31/2015   Pancytopenia ([place]) 07/23/2013   Obstructive sleep apnea 08/07/2007   Essential hypertension 08/07/2007   ALLERGIC RHINITIS 08/07/2007   PCP:  Lujean Amel, MD Pharmacy:   [id number] [place] [address]Phone: [phone]: [phone] [facility] [address]: [phone]: [phone]#[phone][place] [address]Phone: [phone]: [phone] Social Determinants of Health (SDOH) Social History: SDOH Screenings   Food Insecurity: No Food Insecurity (11/13/2022)  Housing: Low Risk  (11/13/2022)  Transportation Needs: No Transportation Needs (11/13/2022)  Utilities: Not At Risk (11/13/2022)  Tobacco Use: Medium Risk (11/13/2022)    Readmission Risk Interventions     No data to display

## 2022-11-15 NOTE — Progress Notes (Signed)
Patient is needing IV ABX prior to discharging.

## 2022-11-15 NOTE — Progress Notes (Signed)
Physician Discharge Summary  DANAH BERN [id number] DOB: [birth year] DOA: 11/12/2022  PCP: Lujean Amel, MD  Admit date: 11/12/2022 Discharge date: 11/15/2022  Time spent: 40 minutes  Recommendations for Outpatient Follow-up:  Follow outpatient CBC/CMP  Follow cellulitis outpatient Follow CXR outpatient  Follow thrombocytopenia outpatient Follow afib/HR outpatient Follow cirrhosis outpatient, upcoming tips   Discharge Diagnoses:  Principal Problem:   Sepsis [facility]) Active Problems:   Abdominal wall cellulitis   Liver cirrhosis secondary to NASH (nonalcoholic steatohepatitis) ([place])   Discharge Condition: stable  Diet recommendation: heart healthy, diabetic  Filed Weights   11/12/22 1416 11/12/22 2137  Weight: (!) 161.9 kg (!) 158.8 kg    History of present illness:   Hannah Khan is Hannah Khan 67 y.o. female with medical history significant of chronic HFpEF, morbid obesity, OSA, NASH/cirrhosis with chronic thrombocytopenia, IDDM, presented with worsening of rash and pain of abdominal wall.   She was admitted for cellulitis.  She's improved with IV antibiotics.  Discharged 2/27 on duricef.  Hospital Course:  Assessment and Plan:  Sepsis due to Cellulitis presented with fever, tachycardia, tachypena on abdominal wall --- improved low threshold for imaging if not improving or worsening -ancef 2/25 - 2/27.  Discharge with 7 days duricef. -blood cultures NGx2   Tona Qualley-fib with RVR Rate controlled, continue metop and diltiazem Given the significant history of cirrhosis and chronic thrombocytopenia, not Prudencio Velazco candidate for systemic anticoagulation.   Pulmonary Vascular Congestion continue home bumex   NASH/cirrhosis resume home bumex She was cleared by cardiology for TIPS, follow with IR for this procedure Continue Xifaxan and lactulose   Morbid obesity Outpatient bariatric evaluation   IIDM Resume home meds at discharge   Chronic thrombocytopenia Hold off chemical  DVT prophylaxis Will trend    Obesity Body mass index is 54.83 kg/m.   Musculoskeletal Neck Pain Heating pack     Procedures: none   Consultations: none  Discharge Exam: Vitals:   11/15/22 1051 11/15/22 1119  BP: (!) 115/59 117/61  Pulse:  72  Resp:  20  Temp:  98.1 F (36.7 C)  SpO2:  98%   Feeling better Redness improving  General: No acute distress. obese Cardiovascular: irregular, rate in 80's Lungs:unlabored Abdomen: Soft, nontender, nondistended  Neurological: Alert and oriented 3. Moves all extremities 4 with equal strength. Cranial nerves II through XII grossly intact. Skin: improved redness to abdomen, cellulitis improving Extremities: No clubbing or cyanosis. No edema.   Discharge Instructions   Discharge Instructions     Call MD for:  difficulty breathing, headache or visual disturbances   Complete by: As directed    Call MD for:  extreme fatigue   Complete by: As directed    Call MD for:  hives   Complete by: As directed    Call MD for:  persistant dizziness or light-headedness   Complete by: As directed    Call MD for:  persistant nausea and vomiting   Complete by: As directed    Call MD for:  redness, tenderness, or signs of infection (pain, swelling, redness, odor or green/yellow discharge around incision site)   Complete by: As directed    Call MD for:  severe uncontrolled pain   Complete by: As directed    Call MD for:  temperature >100.4   Complete by: As directed    Diet - low sodium heart healthy   Complete by: As directed    Discharge instructions   Complete by: As directed    You were  seen for cellulitis of your abdomen.  This has improved with IV antibiotics.  We'll discharge you with 7 days of duricef to complete Alison Breeding course of antibiotics.  Follow up with your outpatient doctors as planned.  Repeat Eulan Heyward chest x ray outpatient in [place]issinger few weeks.  Return for new, recurrent, or worsening symptoms.  Please ask your PCP to request  records from this hospitalization so they know what was done and what the next steps will be.   Increase activity slowly   Complete by: As directed       Allergies as of 11/15/2022       Reactions   Celecoxib Other (See Comments)   "speech slurred" with celebrex   Shellfish Allergy Swelling, Rash   TINGLING AND SWELLING OF LIPS. LARGE WHELPS QUARTER-SIZE   Barium-containing Compounds Other (See Comments)   TACHYCARDIA   Prednisone Other (See Comments)   TACHYCARDIA   Statins Other (See Comments)   AGGRAVATED FIBROMYALGIA   Fentanyl    Hallucinations    Ibuprofen Other (See Comments)   UNSPECIFIED SPECIFIC REACTION >> "DUE TO PLATELETS"   Tramadol    Hallucination   Tylenol [acetaminophen] Other (See Comments)   UNSPECIFIED SPECIFIC REACTION >> "DUE TO PLATELETS"   Zetia [ezetimibe]    Headache        Medication List     TAKE these medications    albuterol (2.5 MG/3ML) 0.083% nebulizer solution Commonly known as: PROVENTIL Take 3 mLs (2.5 mg total) by nebulization every 6 (six) hours as needed for wheezing or shortness of breath.   benzonatate 100 MG capsule Commonly known as: Tessalon Perles Take 1 capsule (100 mg total) by mouth 3 (three) times daily as needed for cough.   bumetanide 2 MG tablet Commonly known as: BUMEX Take 1 tablet (2 mg total) by mouth 2 (two) times daily. What changed: when to take this   cefadroxil 500 MG capsule Commonly known as: DURICEF Take 1 capsule (500 mg total) by mouth 2 (two) times daily for 7 days.   diltiazem 180 MG 24 hr capsule Commonly known as: CARDIZEM CD TAKE 1 CAPSULE BY MOUTH EVERY DAY What changed: how much to take   empagliflozin 25 MG Tabs tablet Commonly known as: JARDIANCE Take 25 mg by mouth daily.   lactulose 10 GM/15ML solution Commonly known as: CHRONULAC Take 30 mLs by mouth 3 (three) times daily.   metolazone 5 MG tablet Commonly known as: ZAROXOLYN TAKE 1 TABLE EVERY 3 DAYS AS NEEDED FOR  MORE THAN 4 POUND WEIGHT GAIN. What changed: See the new instructions.   metoprolol succinate 50 MG 24 hr tablet Commonly known as: TOPROL-XL TAKE 3 TABLETS ONCE Drayton Tieu DAY Take with or immediately following Anjolina Byrer meal. What changed:  how much to take how to take this when to take this   NEOSPORIN EX Apply 1 Application topically daily as needed (wound Treatment).   OneTouch Delica Plus 0000000 Misc Apply topically 3 (three) times daily.   OneTouch Verio test strip Generic drug: glucose blood 1 each 3 (three) times daily.   Ozempic (1 MG/DOSE) 4 MG/3ML Sopn Generic drug: Semaglutide (1 MG/DOSE) Inject 1 mg into the skin every Thursday. What changed: Another medication with the same name was changed. Make sure you understand how and when to take each.   Ozempic (2 MG/DOSE) 8 MG/3ML Sopn Generic drug: Semaglutide (2 MG/DOSE) Inject 2 mg into the skin once Nyquan Selbe week. What changed: when to take this   potassium chloride 10  MEQ CR capsule Commonly known as: MICRO-K Take 40 mEq by mouth 2 (two) times daily.   PreserVision AREDS 2 Caps Take 1 capsule by mouth 2 (two) times daily.   Systane Balance 0.6 % Soln Generic drug: Propylene Glycol Place 1 drop into both eyes 2 (two) times daily.   Xifaxan 550 MG Tabs tablet Generic drug: rifaximin Take 550 mg by mouth 2 (two) times daily.       Allergies  Allergen Reactions   Celecoxib Other (See Comments)    "speech slurred" with celebrex   Shellfish Allergy Swelling and Rash    TINGLING AND SWELLING OF LIPS. LARGE WHELPS QUARTER-SIZE   Barium-Containing Compounds Other (See Comments)    TACHYCARDIA   Prednisone Other (See Comments)    TACHYCARDIA   Statins Other (See Comments)    AGGRAVATED FIBROMYALGIA   Fentanyl     Hallucinations    Ibuprofen Other (See Comments)    UNSPECIFIED SPECIFIC REACTION >> "DUE TO PLATELETS"   Tramadol     Hallucination    Tylenol [Acetaminophen] Other (See Comments)    UNSPECIFIED SPECIFIC  REACTION >> "DUE TO PLATELETS"   Zetia [Ezetimibe]     Headache      The results of significant diagnostics from this hospitalization (including imaging, microbiology, ancillary and laboratory) are listed below for reference.    Significant Diagnostic Studies: DG Chest Port 1 View  Result Date: 11/12/2022 CLINICAL DATA:  Shortness of breath EXAM: PORTABLE CHEST 1 VIEW COMPARISON:  03/29/2021 FINDINGS: UPPER limits normal heart size noted. Pulmonary vascular congestion is identified. LEFT retrocardiac opacity may be artifactual or represent consolidation/atelectasis. There is no evidence pneumothorax or large pleural effusion. No acute bony abnormalities are noted. IMPRESSION: 1. LEFT retrocardiac opacity which may be artifactual or represent consolidation/atelectasis. Consider PA and LATERAL chest for further evaluation. 2. Pulmonary vascular congestion. Electronically Signed   By: Margarette Canada M.D.   On: 11/12/2022 15:10   IR Radiologist Eval & Mgmt  Result Date: 11/02/2022 EXAM: ESTABLISHED PATIENT OFFICE VISIT CHIEF COMPLAINT: See Epic note. HISTORY OF PRESENT ILLNESS: See Epic note. REVIEW OF SYSTEMS: See Epic note. PHYSICAL EXAMINATION: See Epic note. ASSESSMENT AND PLAN: See Epic note. Ruthann Cancer, MD [facility] Electronically Signed   By: Ruthann Cancer M.D.   On: 11/02/2022 12:24   ECHOCARDIOGRAM COMPLETE  Result Date: 11/01/2022    ECHOCARDIOGRAM REPORT   Patient Name:   SHAINA MAHAN Date of Exam: 11/01/2022 Medical Rec #:  [id number]       Height:       67.0 in Accession #:    [id number]      Weight:       339.9 lb Date of Birth:  02/10/1956       BSA:          2.534 m Patient Age:    78 years        BP:           129/89 mmHg Patient Gender: F               HR:           79 bpm. Exam Location:  [address] Procedure: 2D Echo, Cardiac Doppler, Color Doppler and Intracardiac            Opacification Agent Indications:     Z01.810 Pre-Op  History:        Patient has prior history of Echocardiogram examinations, most  recent 03/20/2021. CHF, Arrythmias:Atrial Fibrillation; Risk                 Factors:Hypertension, Sleep Apnea and Diabetes.  Sonographer:    Marygrace Drought RCS Referring Phys: [id number] [place]  1. Left ventricular ejection fraction, by estimation, is 60 to 65%. The left ventricle has normal function. The left ventricle has no regional wall motion abnormalities. Left ventricular diastolic function could not be evaluated.  2. Right ventricular systolic function is normal. The right ventricular size is normal. There is normal pulmonary artery systolic pressure.  3. Left atrial size was mildly dilated.  4. The mitral valve is normal in structure. Trivial mitral valve regurgitation. No evidence of mitral stenosis.  5. The aortic valve was not well visualized. Aortic valve regurgitation is not visualized. No aortic stenosis is present.  6. The inferior vena cava is normal in size with greater than 50% respiratory variability, suggesting right atrial pressure of 3 mmHg. FINDINGS  Left Ventricle: Left ventricular ejection fraction, by estimation, is 60 to 65%. The left ventricle has normal function. The left ventricle has no regional wall motion abnormalities. The left ventricular internal cavity size was normal in size. There is  no left ventricular hypertrophy. Left ventricular diastolic function could not be evaluated due to atrial fibrillation. Left ventricular diastolic function could not be evaluated. Right Ventricle: The right ventricular size is normal. No increase in right ventricular wall thickness. Right ventricular systolic function is normal. There is normal pulmonary artery systolic pressure. The tricuspid regurgitant velocity is 2.66 m/s, and  with an assumed right atrial pressure of 3 mmHg, the estimated right ventricular systolic pressure is 123456 mmHg. Left Atrium: Left atrial size  was mildly dilated. Right Atrium: Right atrial size was normal in size. Pericardium: There is no evidence of pericardial effusion. Mitral Valve: The mitral valve is normal in structure. Trivial mitral valve regurgitation. No evidence of mitral valve stenosis. Tricuspid Valve: The tricuspid valve is normal in structure. Tricuspid valve regurgitation is trivial. No evidence of tricuspid stenosis. Aortic Valve: The aortic valve was not well visualized. Aortic valve regurgitation is not visualized. No aortic stenosis is present. Pulmonic Valve: The pulmonic valve was normal in structure. Pulmonic valve regurgitation is not visualized. No evidence of pulmonic stenosis. Aorta: The aortic root is normal in size and structure. Venous: The inferior vena cava is normal in size with greater than 50% respiratory variability, suggesting right atrial pressure of 3 mmHg. IAS/Shunts: No atrial level shunt detected by color flow Doppler.  LEFT VENTRICLE PLAX 2D LVIDd:         4.90 cm   Diastology LVIDs:         3.50 cm   LV e' medial:    11.40 cm/s LV PW:         1.30 cm   LV E/e' medial:  12.4 LV IVS:        1.00 cm   LV e' lateral:   10.40 cm/s LVOT diam:     2.00 cm   LV E/e' lateral: 13.6 LV SV:         51 LV SV Index:   20 LVOT Area:     3.14 cm  RIGHT VENTRICLE RV Basal diam:  3.40 cm RV S prime:     12.60 cm/s TAPSE (M-mode): 1.4 cm RVSP:           31.3 mmHg LEFT ATRIUM  Index        RIGHT ATRIUM           Index LA diam:        4.40 cm 1.74 cm/m   RA Pressure: 3.00 mmHg LA Vol (A2C):   77.1 ml 30.43 ml/m  RA Area:     22.70 cm LA Vol (A4C):   93.7 ml 36.98 ml/m  RA Volume:   72.40 ml  28.58 ml/m LA Biplane Vol: 89.3 ml 35.25 ml/m  AORTIC VALVE LVOT Vmax:   79.80 cm/s LVOT Vmean:  53.800 cm/s LVOT VTI:    0.162 m  AORTA Ao Root diam: 2.70 cm Ao Asc diam:  3.40 cm MITRAL VALVE                TRICUSPID VALVE MV Area (PHT):              TR Peak grad:   28.3 mmHg MV Decel Time:              TR Vmax:        266.00  cm/s MV E velocity: 141.50 cm/s  Estimated RAP:  3.00 mmHg                             RVSP:           31.3 mmHg                              SHUNTS                             Systemic VTI:  0.16 m                             Systemic Diam: 2.00 cm Skeet Latch MD Electronically signed by Skeet Latch MD Signature Date/Time: 11/01/2022/5:54:37 PM    Final     Microbiology: Recent Results (from the past 240 hour(s))  Culture, blood (Routine X 2) w Reflex to ID Panel     Status: None (Preliminary result)   Collection Time: 11/12/22  2:39 PM   Specimen: BLOOD RIGHT ARM  Result Value Ref Range Status   Specimen Description BLOOD RIGHT ARM  Final   Special Requests   Final    BOTTLES DRAWN AEROBIC AND ANAEROBIC Blood Culture results may not be optimal due to an inadequate volume of blood received in culture bottles   Culture   Final    NO GROWTH 3 DAYS Performed at [facility], [address], [place], [place] [address]    Report Status PENDING  Incomplete  Group Candita Borenstein Strep by PCR     Status: None   Collection Time: 11/12/22  5:41 PM   Specimen: Throat; Sterile Swab  Result Value Ref Range Status   Group Celsey Asselin Strep by PCR NOT DETECTED NOT DETECTED Final    Comment: Performed at [facility], [place] [address], [place], [place] [address]  MRSA Next Gen by PCR, Nasal     Status: None   Collection Time: 11/12/22  5:46 PM  Result Value Ref Range Status   MRSA by PCR Next Gen NOT DETECTED NOT DETECTED Final    Comment: (NOTE) The GeneXpert MRSA Assay (FDA approved for NASAL specimens only), is one component of Hershey Knauer comprehensive  MRSA colonization surveillance program. It is not intended to diagnose MRSA infection nor to guide or monitor treatment for MRSA infections. Test performance is not FDA approved in patients less than 104 years old. Performed at [facility], [place] [address] [place], [place] [address]   Culture, blood (Routine X 2) w Reflex to ID Panel     Status: None  (Preliminary result)   Collection Time: 11/12/22 10:16 PM   Specimen: BLOOD LEFT HAND  Result Value Ref Range Status   Specimen Description BLOOD LEFT HAND  Final   Special Requests   Final    BOTTLES DRAWN AEROBIC AND ANAEROBIC Blood Culture adequate volume   Culture   Final    NO GROWTH 3 DAYS Performed at [facility], [place] [address] [place], [place] [address]    Report Status PENDING  Incomplete     Labs: Basic Metabolic Panel: Recent Labs  Lab 11/12/22 1431 11/13/22 0147 11/14/22 0139 11/15/22 0156  NA 136 137 137 135  K 4.1 3.8 3.1* 3.5  CL 103 106 107 102  CO2 '24 23 23 26  '$ GLUCOSE 137* 137* 126* 126*  BUN '17 19 18 19  '$ CREATININE 0.94 0.93 0.87 0.79  CALCIUM 8.6* 8.7* 8.3* 8.3*  MG  --   --  1.8 1.8  PHOS  --   --  2.5 2.4*   Liver Function Tests: Recent Labs  Lab 11/14/22 0139 11/15/22 0156  AST 32 33  ALT 18 19  ALKPHOS 61 62  BILITOT 3.9* 3.9*  PROT 5.4* 5.5*  ALBUMIN 2.5* 2.4*   No results for input(s): "LIPASE", "AMYLASE" in the last 168 hours. No results for input(s): "AMMONIA" in the last 168 hours. CBC: Recent Labs  Lab 11/12/22 1431 11/13/22 0147 11/14/22 0139 11/14/22 1540 11/15/22 0156  WBC 11.2* 6.9 2.9* 4.4 3.6*  NEUTROABS 10.3*  --  2.2  --  2.5  HGB 14.0 13.4 11.7* 12.8 11.8*  HCT 40.4 40.9 34.7* 38.4 34.1*  MCV 97.3 99.8 96.1 96.2 94.7  PLT 27* 22* PLATELET CLUMPS NOTED ON SMEAR, UNABLE TO ESTIMATE 26* 23*   Cardiac Enzymes: No results for input(s): "CKTOTAL", "CKMB", "CKMBINDEX", "TROPONINI" in the last 168 hours. BNP: BNP (last 3 results) Recent Labs    11/12/22 1443  BNP 434.3*    ProBNP (last 3 results) No results for input(s): "PROBNP" in the last 8760 hours.  CBG: Recent Labs  Lab 11/14/22 1210 11/14/22 1529 11/14/22 2228 11/15/22 0750 11/15/22 1118  GLUCAP 111* 143* 117* 135* 139*       Signed:  Fayrene Helper MD.  [facility] 11/15/2022, 2:31 PM

## 2022-11-15 NOTE — Care Management Important Message (Signed)
Important Message  Patient Details  Name: Hannah Khan MRN: [id number] Date of Birth: 1956/05/14   Medicare Important Message Given:  Yes     Shelda Altes 11/15/2022, 9:48 AM

## 2022-11-17 ENCOUNTER — Encounter (HOSPITAL_COMMUNITY): Payer: Self-pay | Admitting: Physician Assistant

## 2022-11-17 ENCOUNTER — Encounter: Payer: Self-pay | Admitting: Oncology

## 2022-11-17 ENCOUNTER — Other Ambulatory Visit: Payer: Self-pay | Admitting: Radiology

## 2022-11-17 ENCOUNTER — Encounter (HOSPITAL_COMMUNITY): Payer: Self-pay | Admitting: Interventional Radiology

## 2022-11-17 DIAGNOSIS — K7581 Nonalcoholic steatohepatitis (NASH): Secondary | ICD-10-CM

## 2022-11-17 DIAGNOSIS — G4733 Obstructive sleep apnea (adult) (pediatric): Secondary | ICD-10-CM | POA: Diagnosis not present

## 2022-11-17 LAB — CULTURE, BLOOD (ROUTINE X 2)
Culture: NO GROWTH
Culture: NO GROWTH
Special Requests: ADEQUATE

## 2022-11-17 NOTE — Progress Notes (Signed)
PCP - Dr Lauretta Grill [place] Cardiologist - Dr Skeet Latch (Surgical Clearance in EPIC dated 11/03/22 by [name], NP).  Chest x-ray - 11/12/22 (1V) EKG - 11/12/22 Stress Test - n/a ECHO - 11/01/22 Cardiac Cath - n/a  ICD Pacemaker/Loop - n/a  Sleep Study -  Yes (05/2018) CPAP - uses CPAP nightly, settings varies between 14-20.  Instructed to bring on DOS.  Diabetes Type 2 Do not take Jardiance on the morning of surgery (Hold for 72 hrs prior to procedure).  Last dose was on 11/17/22.    Hold Ozempic for 1 week prior to surgery. (Takes med on Thursday.  Last dose was on 11/10/22.  If your blood sugar is less than 70 mg/dL, you will need to treat for low blood sugar: Treat a low blood sugar (less than 70 mg/dL) with  cup of clear juice (cranberry or apple), 4 glucose tablets, OR glucose gel. Recheck blood sugar in 15 minutes after treatment (to make sure it is greater than 70 mg/dL). If your blood sugar is not greater than 70 mg/dL on recheck, call [phone] for further instructions.  NPO  Anesthesia review: Yes, Ebony Hail PA  STOP now taking any Aspirin (unless otherwise instructed by your surgeon), Aleve, Naproxen, Ibuprofen, Motrin, Advil, Goody's, BC's, all herbal medications, fish oil, and all vitamins.   Coronavirus Screening Do you have any of the following symptoms:  Cough Yes, occasional  Fever (>100.68F)  yes/no: No Runny nose yes/no: No Sore throat yes/no: No Difficulty breathing/shortness of breath  yes/no: Yes  Have you traveled in the last 14 days and where? yes/no: No  Patient verbalized understanding of instructions that were given via phone.

## 2022-11-17 NOTE — Progress Notes (Signed)
Anesthesia Chart Review: Hannah Khan  Case: [id number] Date/Time: 11/21/22 0845   Procedure: TIPS   Anesthesia type: General   Pre-op diagnosis: Liver cirrhosis secondary to NASH   Location: [place] [facility]   Surgeons: Suzette Battiest, MD       DISCUSSION: Patient is a 67 year old female scheduled for the above procedure. She was diagnosed with NASH cirrhosis in 2009. Child Pugh B, MELD 15.  Her PLT count is primarily in the 20-40K range. One prior episode of significant hepatic encephalopathy, but overall controlled on lactulose and rifaximin. She has not experienced an upper of GI hemorrhage, but recent CT demonstrated "multiple portosystemic shunts including gastrorenal and an ectopic SMV-->IVC varix, and confirmed suspicion of main portal vein chronic thrombus seen on 2021 CT.  She has very large gastroesophageal varices.  Her history of encephalopathy and hyperbilirubinemia is compatible with portosystemic shunt syndrome." She was referred to IR for TIPS consideration.    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 She also had suspected autoimmune ITP ~ 2010. PLT count has been < 100K since at least 2012 and has been primarily in the 20K-40K since 2016. She had intermittently been given Nplate and/or platelets prior to procedures. For 2018 breast lumpectomy 1 unit of platelet only  raised her PLT count from 25K to 30K, so she was given weekly Nplate with improvement to 70K. For 2022 shoulder arthroscopy she was transfused 1 unit platelets preoperatively for PLT count of 27K. (They did not check a post-transfusion PLT count. EBL was minimal.) Last visit with Dr. Alen Blew was on 07/14/20 with stable platelet count without active bleeding, so no intervention required, but noted, "Platelet transfusion or growth factor support would be needed if active bleeding is noted or surgical procedure is contemplated."  [facility] admission 11/12/22 - 10/2722 for abdominal wall cellulitis which improved with IV antibiotics, and discharged home on Duricef. Blood cultures negative x2. Known chronic afib but with RVR initially. IV Digoxin given. Rate controlled on metoprolol and Cardizem by discharge. (Not on anticoagulation due to liver disease and thrombocytopenia, and she is not a candidate for a Watchman device.) Hospital notes did not suggest a concern for active pneumonia. I communicated recent admission to Dr. Serafina Royals.    Last cardiology evaluation was on 11/03/22 by [name], NP. Preop clearance - Upcoming TIPS with Dr. Serafina Royals. According to the Revised Cardiac Risk Index (RCRI), her Perioperative Risk of Major Cardiac Event is (%): 0.9. Her Functional Capacity in METs is: 4.73 according to the [facility] Activity Status Index (DASI). Per AHA/ACC guidelines, she is deemed acceptable risk for the planned procedure without additional cardiovascular testing. Will route to surgical team so they are aware.     Last dose of Ozempic reported as 11/10/22 and advised to hold until after procedure. Advised to hold Jardiance for 72 hours, but had already taken 11/17/22 dose.   Given history, T&S added to pre-procedure lab orders. I also discussed with anesthesiologist Stoltzfus, Belenda Cruise, DO. Given previous hematology recommendations, he anticipated she may need preprocedure  platelets, 1 or 2 units since she has  not received Nplate. I communicated with Dr. Serafina Royals, who will defer decision to the anesthesiologist, as he is fine with reserving platelet transfusion if > 20K since minimal EBL is anticipated. She will get repeat labs on the day of her procedure and decision regarding preprocedure platelets can be addressed at that time. We've asked her to arrive early at 6:00 AM. Blood Bank was also notified of potential need for platelets.    VS: Ht '5\' 7"'$  (1.702 m)   Wt (!) 161.9 kg   BMI 55.91 kg/m  BP Readings from Last 3 Encounters:  11/15/22 119/70  11/03/22 120/80  11/02/22 131/71   Pulse Readings from Last 3 Encounters:  11/15/22 83  11/03/22 97  11/02/22 98    PROVIDERS: Koirala, Dibas, MD is PCP  Skeet Latch, MD is cardiologist [name], MD is GI Zola Button, MD is HEM-ONC. Last visit was in 2021, and recent he relocated and is no longer with [facility].   LABS: For day of procedure as indicated. Last results in [facility] include: Lab Results  Component Value Date   WBC 3.6 (L) 11/15/2022   HGB 11.8 (L) 11/15/2022   HCT 34.1 (L) 11/15/2022   PLT 23 (LL) 11/15/2022   GLUCOSE 126 (H) 11/15/2022   CHOL 167 07/14/2022   TRIG 77 07/14/2022   HDL 84 07/14/2022   LDLCALC 69 07/14/2022   ALT 19 11/15/2022   AST 33 11/15/2022   NA 135 11/15/2022   K 3.5 11/15/2022   CL 102 11/15/2022   CREATININE 0.79 11/15/2022   BUN 19 11/15/2022   CO2 26 11/15/2022   TSH 1.135 01/10/2021   INR 1.4 (H) 11/15/2022   HGBA1C 4.7 (L) 11/12/2022    IMAGES: 1V PCXR 11/12/22 (during admission for abd cellulitis, afib with RVR, notes do not suggest a concern for active pneumonia): FINDINGS: - UPPER limits normal heart size noted. Pulmonary vascular congestion is identified. -  LEFT retrocardiac opacity may be artifactual or represent consolidation/atelectasis. -  There is no evidence pneumothorax or large pleural effusion. -  No acute bony abnormalities are noted.  IMPRESSION: 1. LEFT  retrocardiac opacity which may be artifactual or represent consolidation/atelectasis. Consider PA and LATERAL chest for further evaluation. 2. Pulmonary vascular congestion.  CTA Abd/pelvis 09/30/22: IMPRESSION: VASCULAR 1. Advanced hepatic cirrhosis with chronic occlusion of the main and intrahepatic portal veins with cavernous transformation. 2. Portal flow is redirected through several portal systemic shunts including a large gastro renal shunt resulting in significant intramucosal gastric varices. Additionally, there is a prominent portosystemic collateral in the right lower quadrant between ileal/ileocolic branches of the SMV and the adjacent right ovarian vein. 3. Mild scattered arterial atherosclerotic plaque without significant stenosis, occlusion, aneurysm or dissection. Aortic Atherosclerosis (ICD10-I70.0).   NON VASCULAR 1. Hepatic cirrhosis with portal hypertension and splenomegaly. 2. Abnormal appearance of IUD which is tilted and present in the lower uterine segment rather than in the typical location. 3. Panniculitis. 4. Surgical changes of prior right hemicolectomy without evidence of complication. 5. Cardiomegaly. 6. Additional ancillary findings as above.   EKG: EKG 11/12/22:  Atrial fibrillation at 136 bpm  Left anterior fascicular block Low voltage, precordial leads Consider anterior infarct Confirmed by Lacretia Leigh ([id number]) on 11/12/2022 2:44:44 PM   CV: Echo 11/01/22: IMPRESSIONS   1. Left ventricular ejection fraction, by estimation, is 60 to 65%. The  left ventricle has normal function. The left ventricle has no regional  wall  motion abnormalities. Left ventricular diastolic function could not  be evaluated.   2. Right ventricular systolic function is normal. The right ventricular  size is normal. There is normal pulmonary artery systolic pressure.   3. Left atrial size was mildly dilated.   4. The mitral valve is normal in structure. Trivial  mitral valve  regurgitation. No evidence of mitral stenosis.   5. The aortic valve was not well visualized. Aortic valve regurgitation  is not visualized. No aortic stenosis is present.   6. The inferior vena cava is normal in size with greater than 50%  respiratory variability, suggesting right atrial pressure of 3 mmHg.    Past Medical History:  Diagnosis Date   Anemia    hx of   Arthritis    knees   Diabetes mellitus without complication (HCC)    type 2   Dyspnea    Fibromyalgia    H/O transfusion of platelets    Heart murmur    never has caused any problems   Hx of colonic polyps    s/p partial colectomy   Hyperlipidemia    Hypertension    Joint pain    in knees and back spasms   Left leg swelling    wear compression hose   Liver cirrhosis secondary to NASH (nonalcoholic steatohepatitis) ([place]) dx nov 2014   had enlarged spleen also    Morbid obesity ([place])    Persistent atrial fibrillation ([place])    Pneumonia 10/2016   x 5   PONV (postoperative nausea and vomiting)    in past none recent   Portal hypertension ([place])    Sleep apnea    uses cpap, setting varies between 14-20   Spleen enlarged    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 [facility])     Past Surgical History:  Procedure Laterality Date   BREAST LUMPECTOMY WITH RADIOACTIVE SEED LOCALIZATION Right 08/29/2017   Procedure: RIGHT BREAST LUMPECTOMY WITH RADIOACTIVE SEEDS LOCALIZATION;  Surgeon: Erroll Luna, MD;  Location: [place];  Service: General;  Laterality: Right;   [place]  20 yrs ago   COLECTOMY  2011   COLONOSCOPY     COLONOSCOPY N/A 09/05/2018   Procedure: COLONOSCOPY;  Surgeon: [name], MD;  Location: [facility];  Service: Endoscopy;  Laterality: N/A;   COLONOSCOPY WITH PROPOFOL Bilateral 05/04/2022   Procedure: COLONOSCOPY WITH PROPOFOL;  Surgeon: [name], MD;  Location: [facility];  Service: Gastroenterology;  Laterality: Bilateral;   DILATATION &  CURETTAGE/HYSTEROSCOPY WITH MYOSURE N/A 05/06/2016   Procedure: DILATATION & CURETTAGE/HYSTEROSCOPY WITH MYOSURE WITH POLYPECTOMY;  Surgeon: Janyth Pupa, DO;  Location: [facility];  Service: Gynecology;  Laterality: N/A;   ESOPHAGOGASTRODUODENOSCOPY (EGD) WITH PROPOFOL N/A 09/04/2013   Procedure: ESOPHAGOGASTRODUODENOSCOPY (EGD) WITH PROPOFOL;  Surgeon: [name], MD;  Location: [facility];  Service: Endoscopy;  Laterality: N/A;   ESOPHAGOGASTRODUODENOSCOPY (EGD) WITH PROPOFOL Bilateral 05/04/2022   Procedure: ESOPHAGOGASTRODUODENOSCOPY (EGD) WITH PROPOFOL;  Surgeon: [name], MD;  Location: [facility];  Service: Gastroenterology;  Laterality: Bilateral;   IR RADIOLOGIST EVAL & MGMT  09/21/2022   IR RADIOLOGIST EVAL & MGMT  10/06/2022   IR RADIOLOGIST EVAL & MGMT  11/02/2022   KNEE ARTHROSCOPY  yrs ago   bilateral, one done x 1, one done twice   POLYPECTOMY  09/05/2018   Procedure: POLYPECTOMY;  Surgeon: [name], MD;  Location: [facility];  Service: Endoscopy;;    MEDICATIONS: No current facility-administered medications for this encounter.    diclofenac Sodium (  VOLTAREN) 1 % GEL   empagliflozin (JARDIANCE) 25 MG TABS tablet   albuterol (PROVENTIL) (2.5 MG/3ML) 0.083% nebulizer solution   Bacitracin-Polymyxin B (NEOSPORIN EX)   benzonatate (TESSALON PERLES) 100 MG capsule   bumetanide (BUMEX) 2 MG tablet   cefadroxil (DURICEF) 500 MG capsule   diltiazem (CARDIZEM CD) 180 MG 24 hr capsule   lactulose (CHRONULAC) 10 GM/15ML solution   Lancets (ONETOUCH DELICA PLUS [id number]) MISC   metolazone (ZAROXOLYN) 5 MG tablet   metoprolol succinate (TOPROL-XL) 50 MG 24 hr tablet   Multiple Vitamins-Minerals (PRESERVISION AREDS 2) CAPS   ONETOUCH VERIO test strip   potassium chloride (MICRO-K) 10 MEQ CR capsule   Propylene Glycol (SYSTANE BALANCE) 0.6 % SOLN   Semaglutide, 1 MG/DOSE, (OZEMPIC, 1 MG/DOSE,) 4 MG/3ML SOPN   Semaglutide, 2 MG/DOSE, (OZEMPIC, 2 MG/DOSE,) 8 MG/3ML  SOPN   XIFAXAN 550 MG TABS tablet    Myra Gianotti, PA-C Surgical Short Stay/Anesthesiology [facility] [place] Phone [phone] [facility] Phone [phone] 11/18/2022 12:41 PM

## 2022-11-18 ENCOUNTER — Other Ambulatory Visit: Payer: Self-pay | Admitting: Student

## 2022-11-18 DIAGNOSIS — K7460 Unspecified cirrhosis of liver: Secondary | ICD-10-CM

## 2022-11-18 DIAGNOSIS — D649 Anemia, unspecified: Secondary | ICD-10-CM | POA: Diagnosis not present

## 2022-11-18 DIAGNOSIS — I5032 Chronic diastolic (congestive) heart failure: Secondary | ICD-10-CM | POA: Diagnosis not present

## 2022-11-18 DIAGNOSIS — G4733 Obstructive sleep apnea (adult) (pediatric): Secondary | ICD-10-CM | POA: Diagnosis not present

## 2022-11-18 DIAGNOSIS — A419 Sepsis, unspecified organism: Secondary | ICD-10-CM | POA: Diagnosis not present

## 2022-11-18 DIAGNOSIS — M170 Bilateral primary osteoarthritis of knee: Secondary | ICD-10-CM | POA: Diagnosis not present

## 2022-11-18 DIAGNOSIS — M797 Fibromyalgia: Secondary | ICD-10-CM | POA: Diagnosis not present

## 2022-11-18 DIAGNOSIS — E119 Type 2 diabetes mellitus without complications: Secondary | ICD-10-CM | POA: Diagnosis not present

## 2022-11-18 DIAGNOSIS — L03311 Cellulitis of abdominal wall: Secondary | ICD-10-CM | POA: Diagnosis not present

## 2022-11-18 DIAGNOSIS — I4819 Other persistent atrial fibrillation: Secondary | ICD-10-CM | POA: Diagnosis not present

## 2022-11-18 NOTE — Anesthesia Preprocedure Evaluation (Signed)
Anesthesia Evaluation    Airway        Dental   Pulmonary former smoker          Cardiovascular hypertension,      Neuro/Psych    GI/Hepatic   Endo/Other  diabetes    Renal/GU      Musculoskeletal   Abdominal   Peds  Hematology   Anesthesia Other Findings   Reproductive/Obstetrics                              Anesthesia Physical Anesthesia Plan  ASA:   Anesthesia Plan:    Post-op Pain Management:    Induction:   PONV Risk Score and Plan:   Airway Management Planned:   Additional Equipment:   Intra-op Plan:   Post-operative Plan:   Informed Consent:   Plan Discussed with:   Anesthesia Plan Comments: (See PAT note written 11/18/2022 by Myra Gianotti, PA-C.  Patient is a 67 year old female scheduled for the above procedure. She was diagnosed with NASH cirrhosis in 2009. Child Pugh B, MELD 15.  Her PLT count is primarily in the 20-40K range (thought due to cirrhosis and sequestration, although suspected ITP in ~ 2010). One prior episode of significant hepatic encephalopathy, but overall controlled on lactulose and rifaximin. She has not experienced an upper of GI hemorrhage, but recent CT demonstrated "multiple portosystemic shunts including gastrorenal and an ectopic SMV-->IVC varix, and confirmed suspicion of main portal vein chronic thrombus seen on 2021 CT.  She has very large gastroesophageal varices.  Her history of encephalopathy and hyperbilirubinemia is compatible with portosystemic shunt syndrome." She was referred to IR for TIPS consideration.    Other history includes OSA (uses CPAP), DM2, super morbid obesity (BMI > 55), afib, chronic diastolic CHF.  She had intermittently been given Nplate and/or platelets prior to procedures. For 2018 breast lumpectomy 1 unit of platelet only raised her PLT count from 25K to 30K, so she was given weekly Nplate with improvement to 70K.  For 2022 shoulder arthroscopy she was transfused 1 unit platelets preoperatively for PLT count of 27K. (They did not check a post-transfusion PLT count. EBL was minimal.) Last visit with Dr. Alen Blew was on 07/14/20 with stable platelet count without active bleeding, so no intervention required, but noted, "Platelet transfusion or growth factor support would be needed if active bleeding is noted or surgical procedure is contemplated."  Last cardiology evaluation was on 11/03/22 by [name], NP. Preop clearance - Upcoming TIPS with Dr. Serafina Royals. According to the Revised Cardiac Risk Index (RCRI), her Perioperative Risk of Major Cardiac Event is (%): 0.9. Her Functional Capacity in METs is: 4.73 according to the [facility] Activity Status Index (DASI). Per AHA/ACC guidelines, she is deemed acceptable risk for the planned procedure without additional cardiovascular testing.   Given history, T&S added to pre-procedure lab orders. I also discussed with anesthesiologist Stoltzfus, Belenda Cruise, DO. Given previous hematology recommendations, he anticipated she may need preprocedure platelets, 1 or 2 units since she has not received Nplate. I communicated with Dr. Serafina Royals, who will defer decision to the anesthesiologist, as he is fine with reserving platelet transfusion if > 20K since minimal EBL is anticipated. She will get repeat labs on the day of her procedure and decision regarding preprocedure platelets can be addressed at that time. We've asked her to arrive early at 6:00 AM. Blood Bank was also notified of potential need for platelets.  )  Anesthesia Quick Evaluation  

## 2022-11-18 NOTE — Progress Notes (Signed)
Patient called and instructed to arrive at 6 AM on  11/21/22 (DOS) to receive platelet(s) transfusion per Ebony Hail, [place]. Patient verbalized understanding of the above change. Selinda Eon, RN was informed.  Patient will arrive at 6 AM Monday. 10/23/22 for surgery.

## 2022-11-18 NOTE — H&P (Incomplete)
Chief Complaint: Patient was seen in consultation today for cirrhosis; TIPS  Referring Physician(s): Dr. Paulita Fujita  Supervising Physician: {Supervising Physician:[id number]}  Patient Status: [facility] - Out-pt  History of Present Illness: Hannah Khan is a 67 y.o. female with a medical history significant for NASH cirrhosis with portal hypertension and a prior episode of profound hepatic encephalopathy which required hospitalization. She was referred to Interventional Radiology by Dr. Paulita Fujita for further management of large gastroesophageal varices visualized on endoscopy. She met with Dr. Serafina Royals (tele-visit) 09/21/22 and he discussed the natural history of cirrhosis and portal hypertension, specifically variceal development and spontaneous portosystemic shunting. He also discussed the procedural steps and overview of both transvenous obliteration and TIPS creation. Given the size of the varices on CT from 2021 and the EGD findings she is at high risk for hemorrhage.   The patient expressed interest in pursuing treatment and additional imaging was ordered. CT BRTO protocol obtained confirmed large varices as well as a main portal vein chronic thrombus that was seen on the CT in 2021. Via tele-visit on 10/06/22 Dr. Serafina Royals recommended transsplenic portal vein recanalization, TIPS creation and variceal embolization including coil embolization of a gastrorenal shunt. Sclerotherapy to the gastroesophageal varices may also be performed.   The risks and benefits were discussed in great detail and the patient was in agreement to proceed. She met with Dr. Serafina Royals 11/02/22 to meet in person prior to her procedure scheduled 11/21/22. The patient was made aware that the procedure would be done under general anesthesia with planned overnight observation.   Past Medical History:  Diagnosis Date   Anemia    hx of   Arthritis    knees   Diabetes mellitus without complication (HCC)    type 2   Dyspnea    Fibromyalgia     H/O transfusion of platelets    Heart murmur    never has caused any problems   Hx of colonic polyps    s/p partial colectomy   Hyperlipidemia    Hypertension    Joint pain    in knees and back spasms   Left leg swelling    wear compression hose   Liver cirrhosis secondary to NASH (nonalcoholic steatohepatitis) ([place]) dx nov 2014   had enlarged spleen also    Morbid obesity ([place])    Persistent atrial fibrillation ([place])    Pneumonia 10/2016   x 5   PONV (postoperative nausea and vomiting)    in past none recent   Portal hypertension ([place])    Sleep apnea    uses cpap, setting varies between 14-20   Spleen enlarged    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 [facility])     Past Surgical History:  Procedure Laterality Date   BREAST LUMPECTOMY WITH RADIOACTIVE SEED LOCALIZATION Right 08/29/2017   Procedure: RIGHT BREAST LUMPECTOMY WITH RADIOACTIVE SEEDS LOCALIZATION;  Surgeon: Erroll Luna, MD;  Location: [facility];  Service: General;  Laterality: Right;   [place]  20 yrs ago   COLECTOMY  2011   COLONOSCOPY     COLONOSCOPY N/A 09/05/2018   Procedure: COLONOSCOPY;  Surgeon: [name], MD;  Location: [facility];  Service: Endoscopy;  Laterality: N/A;   COLONOSCOPY WITH PROPOFOL Bilateral 05/04/2022   Procedure: COLONOSCOPY WITH PROPOFOL;  Surgeon: [name], MD;  Location: [facility];  Service: Gastroenterology;  Laterality: Bilateral;   DILATATION & CURETTAGE/HYSTEROSCOPY WITH MYOSURE N/A 05/06/2016   Procedure: DILATATION & CURETTAGE/HYSTEROSCOPY WITH MYOSURE WITH POLYPECTOMY;  Surgeon:  Janyth Pupa, DO;  Location: [facility];  Service: Gynecology;  Laterality: N/A;   ESOPHAGOGASTRODUODENOSCOPY (EGD) WITH PROPOFOL N/A 09/04/2013   Procedure: ESOPHAGOGASTRODUODENOSCOPY (EGD) WITH PROPOFOL;  Surgeon: [name], MD;  Location: [facility];  Service: Endoscopy;  Laterality: N/A;   ESOPHAGOGASTRODUODENOSCOPY (EGD) WITH PROPOFOL Bilateral  05/04/2022   Procedure: ESOPHAGOGASTRODUODENOSCOPY (EGD) WITH PROPOFOL;  Surgeon: [name], MD;  Location: [facility];  Service: Gastroenterology;  Laterality: Bilateral;   IR RADIOLOGIST EVAL & MGMT  09/21/2022   IR RADIOLOGIST EVAL & MGMT  10/06/2022   IR RADIOLOGIST EVAL & MGMT  11/02/2022   KNEE ARTHROSCOPY  yrs ago   bilateral, one done x 1, one done twice   POLYPECTOMY  09/05/2018   Procedure: POLYPECTOMY;  Surgeon: [name], MD;  Location: [facility];  Service: Endoscopy;;    Allergies: Celecoxib, Shellfish allergy, Barium-containing compounds, Prednisone, Statins, Fentanyl, Ibuprofen, Tramadol, Tylenol [acetaminophen], and Zetia [ezetimibe]  Medications: Prior to Admission medications   Medication Sig Start Date End Date Taking? Authorizing Provider  albuterol (PROVENTIL) (2.5 MG/3ML) 0.083% nebulizer solution Take 3 mLs (2.5 mg total) by nebulization every 6 (six) hours as needed for wheezing or shortness of breath. 03/22/21   Dessa Phi, DO  Bacitracin-Polymyxin B (NEOSPORIN EX) Apply 1 Application topically daily as needed (wound Treatment).    [provider]  benzonatate (TESSALON PERLES) 100 MG capsule Take 1 capsule (100 mg total) by mouth 3 (three) times daily as needed for cough. 11/03/22   Loel Dubonnet, NP  bumetanide (BUMEX) 2 MG tablet Take 1 tablet (2 mg total) by mouth 2 (two) times daily. Patient taking differently: Take 2 mg by mouth daily. 01/12/22   Skeet Latch, MD  cefadroxil (DURICEF) 500 MG capsule Take 1 capsule (500 mg total) by mouth 2 (two) times daily for 7 days. 11/15/22 11/22/22  Elodia Florence., MD  diclofenac Sodium (VOLTAREN) 1 % GEL Apply topically at bedtime as needed. Right shoulder and right neck    [provider]  diltiazem (CARDIZEM CD) 180 MG 24 hr capsule TAKE 1 CAPSULE BY MOUTH EVERY DAY Patient taking differently: Take 180 mg by mouth daily. 10/20/22   Loel Dubonnet, NP  empagliflozin  (JARDIANCE) 25 MG TABS tablet Take 25 mg by mouth in the morning.    [provider]  lactulose (CHRONULAC) 10 GM/15ML solution Take 30 mLs by mouth 3 (three) times daily. 03/17/21   [provider]  Lancets (ONETOUCH DELICA PLUS [id number]) MISC Apply topically 3 (three) times daily. 04/10/20   [provider]  metolazone (ZAROXOLYN) 5 MG tablet TAKE 1 TABLE EVERY 3 DAYS AS NEEDED FOR MORE THAN 4 POUND WEIGHT GAIN. Patient taking differently: Take 5 mg by mouth every 3 (three) days. Take 1 table every 3 days  as needed for more than 4 pound weight gain. 07/11/22   Skeet Latch, MD  metoprolol succinate (TOPROL-XL) 50 MG 24 hr tablet TAKE 3 TABLETS ONCE A DAY Take with or immediately following a meal. Patient taking differently: Take 150 mg by mouth daily. TAKE 3 TABLETS ONCE A DAY Take with or immediately following a meal. 07/14/22   Skeet Latch, MD  Multiple Vitamins-Minerals (PRESERVISION AREDS 2) CAPS Take 1 capsule by mouth 2 (two) times daily.    [provider]  [facility] VERIO test strip 1 each 3 (three) times daily. 04/13/20   [provider]  potassium chloride (MICRO-K) 10 MEQ CR capsule Take 40 mEq by mouth 2 (two) times daily.  [provider]  Propylene Glycol (SYSTANE BALANCE) 0.6 % SOLN Place 1 drop into both eyes 2 (two) times daily.    [provider]  Semaglutide, 1 MG/DOSE, (OZEMPIC, 1 MG/DOSE,) 4 MG/3ML SOPN Inject 1 mg into the skin every Thursday. 01/21/22   [provider]  Semaglutide, 2 MG/DOSE, (OZEMPIC, 2 MG/DOSE,) 8 MG/3ML SOPN Inject 2 mg into the skin once a week. Patient taking differently: Inject 2 mg into the skin every Thursday. 11/03/22   Loel Dubonnet, NP  XIFAXAN 550 MG TABS tablet Take 550 mg by mouth 2 (two) times daily. 03/13/21   [provider]     Family History  Problem Relation Age of Onset   Heart failure Mother    Cancer Mother    Hyperlipidemia Mother     Congestive Heart Failure Mother    Stroke Mother    Lung cancer Father    Cancer Father    Cancer Brother    Hyperlipidemia Brother    Stroke Other     Social History   Socioeconomic History   Marital status: Married    Spouse name: Not on file   Number of children: Not on file   Years of education: Not on file   Highest education level: Not on file  Occupational History   Occupation: Merchant navy officer  Tobacco Use   Smoking status: Former    Packs/day: 1.00    Years: 3.00    Total pack years: 3.00    Types: Cigarettes    Quit date: 09/19/1974    Years since quitting: 48.1   Smokeless tobacco: Never  Vaping Use   Vaping Use: Never used  Substance and Sexual Activity   Alcohol use: Not Currently    Comment: occ   Drug use: No   Sexual activity: Not on file  Other Topics Concern   Not on file  Social History Narrative   Lives in [place] with her husband.  Instructor for early childhood development.     Social Determinants of Health   Financial Resource Strain: Not on file  Food Insecurity: No Food Insecurity (11/13/2022)   Hunger Vital Sign    Worried About Running Out of Food in the Last Year: Never true    Ran Out of Food in the Last Year: Never true  Transportation Needs: No Transportation Needs (11/13/2022)   PRAPARE - Hydrologist (Medical): No    Lack of Transportation (Non-Medical): No  Physical Activity: Not on file  Stress: Not on file  Social Connections: Not on file    Review of Systems: A 12 point ROS discussed and pertinent positives are indicated in the HPI above.  All other systems are negative.  Review of Systems  Vital Signs: There were no vitals taken for this visit.  Physical Exam  Imaging: DG Chest Port 1 View  Result Date: 11/12/2022 CLINICAL DATA:  Shortness of breath EXAM: PORTABLE CHEST 1 VIEW COMPARISON:  03/29/2021 FINDINGS: UPPER limits normal heart size noted. Pulmonary vascular congestion is  identified. LEFT retrocardiac opacity may be artifactual or represent consolidation/atelectasis. There is no evidence pneumothorax or large pleural effusion. No acute bony abnormalities are noted. IMPRESSION: 1. LEFT retrocardiac opacity which may be artifactual or represent consolidation/atelectasis. Consider PA and LATERAL chest for further evaluation. 2. Pulmonary vascular congestion. Electronically Signed   By: Margarette Canada M.D.   On: 11/12/2022 15:10   IR Radiologist Eval & Mgmt  Result Date: 11/02/2022 EXAM:  ESTABLISHED PATIENT OFFICE VISIT CHIEF COMPLAINT: See Epic note. HISTORY OF PRESENT ILLNESS: See Epic note. REVIEW OF SYSTEMS: See Epic note. PHYSICAL EXAMINATION: See Epic note. ASSESSMENT AND PLAN: See Epic note. Ruthann Cancer, MD [facility] Electronically Signed   By: Ruthann Cancer M.D.   On: 11/02/2022 12:24   ECHOCARDIOGRAM COMPLETE  Result Date: 11/01/2022    ECHOCARDIOGRAM REPORT   Patient Name:   AMONIE QUEBEDEAUX Date of Exam: 11/01/2022 Medical Rec #:  [id number]       Height:       67.0 in Accession #:    [id number]      Weight:       339.9 lb Date of Birth:  [birth year]       BSA:          2.534 m Patient Age:    78 years        BP:           129/89 mmHg Patient Gender: F               HR:           79 bpm. Exam Location:  [address] Procedure: 2D Echo, Cardiac Doppler, Color Doppler and Intracardiac            Opacification Agent Indications:    Z01.810 Pre-Op  History:        Patient has prior history of Echocardiogram examinations, most                 recent 03/20/2021. CHF, Arrythmias:Atrial Fibrillation; Risk                 Factors:Hypertension, Sleep Apnea and Diabetes.  Sonographer:    Marygrace Drought RCS Referring Phys: [id number] [place]  1. Left ventricular ejection fraction, by estimation, is 60 to 65%. The left ventricle has normal function. The left ventricle has no regional wall motion abnormalities.  Left ventricular diastolic function could not be evaluated.  2. Right ventricular systolic function is normal. The right ventricular size is normal. There is normal pulmonary artery systolic pressure.  3. Left atrial size was mildly dilated.  4. The mitral valve is normal in structure. Trivial mitral valve regurgitation. No evidence of mitral stenosis.  5. The aortic valve was not well visualized. Aortic valve regurgitation is not visualized. No aortic stenosis is present.  6. The inferior vena cava is normal in size with greater than 50% respiratory variability, suggesting right atrial pressure of 3 mmHg. FINDINGS  Left Ventricle: Left ventricular ejection fraction, by estimation, is 60 to 65%. The left ventricle has normal function. The left ventricle has no regional wall motion abnormalities. The left ventricular internal cavity size was normal in size. There is  no left ventricular hypertrophy. Left ventricular diastolic function could not be evaluated due to atrial fibrillation. Left ventricular diastolic function could not be evaluated. Right Ventricle: The right ventricular size is normal. No increase in right ventricular wall thickness. Right ventricular systolic function is normal. There is normal pulmonary artery systolic pressure. The tricuspid regurgitant velocity is 2.66 m/s, and  with an assumed right atrial pressure of 3 mmHg, the estimated right ventricular systolic pressure is 123456 mmHg. Left Atrium: Left atrial size was mildly dilated. Right Atrium: Right atrial size was normal in size. Pericardium: There is no evidence of pericardial effusion. Mitral Valve: The mitral valve is normal in structure. Trivial mitral valve regurgitation. No evidence of mitral  valve stenosis. Tricuspid Valve: The tricuspid valve is normal in structure. Tricuspid valve regurgitation is trivial. No evidence of tricuspid stenosis. Aortic Valve: The aortic valve was not well visualized. Aortic valve regurgitation is not  visualized. No aortic stenosis is present. Pulmonic Valve: The pulmonic valve was normal in structure. Pulmonic valve regurgitation is not visualized. No evidence of pulmonic stenosis. Aorta: The aortic root is normal in size and structure. Venous: The inferior vena cava is normal in size with greater than 50% respiratory variability, suggesting right atrial pressure of 3 mmHg. IAS/Shunts: No atrial level shunt detected by color flow Doppler.  LEFT VENTRICLE PLAX 2D LVIDd:         4.90 cm   Diastology LVIDs:         3.50 cm   LV e' medial:    11.40 cm/s LV PW:         1.30 cm   LV E/e' medial:  12.4 LV IVS:        1.00 cm   LV e' lateral:   10.40 cm/s LVOT diam:     2.00 cm   LV E/e' lateral: 13.6 LV SV:         51 LV SV Index:   20 LVOT Area:     3.14 cm  RIGHT VENTRICLE RV Basal diam:  3.40 cm RV S prime:     12.60 cm/s TAPSE (M-mode): 1.4 cm RVSP:           31.3 mmHg LEFT ATRIUM             Index        RIGHT ATRIUM           Index LA diam:        4.40 cm 1.74 cm/m   RA Pressure: 3.00 mmHg LA Vol (A2C):   77.1 ml 30.43 ml/m  RA Area:     22.70 cm LA Vol (A4C):   93.7 ml 36.98 ml/m  RA Volume:   72.40 ml  28.58 ml/m LA Biplane Vol: 89.3 ml 35.25 ml/m  AORTIC VALVE LVOT Vmax:   79.80 cm/s LVOT Vmean:  53.800 cm/s LVOT VTI:    0.162 m  AORTA Ao Root diam: 2.70 cm Ao Asc diam:  3.40 cm MITRAL VALVE                TRICUSPID VALVE MV Area (PHT):              TR Peak grad:   28.3 mmHg MV Decel Time:              TR Vmax:        266.00 cm/s MV E velocity: 141.50 cm/s  Estimated RAP:  3.00 mmHg                             RVSP:           31.3 mmHg                              SHUNTS                             Systemic VTI:  0.16 m  Systemic Diam: 2.00 cm Skeet Latch MD Electronically signed by Skeet Latch MD Signature Date/Time: 11/01/2022/5:54:37 PM    Final     Labs:  CBC: Recent Labs    11/13/22 0147 11/14/22 0139 11/14/22 1540 11/15/22 0156  WBC 6.9 2.9* 4.4 3.6*   HGB 13.4 11.7* 12.8 11.8*  HCT 40.9 34.7* 38.4 34.1*  PLT 22* PLATELET CLUMPS NOTED ON SMEAR, UNABLE TO ESTIMATE 26* 23*    COAGS: Recent Labs    11/15/22 0156  INR 1.4*    BMP: Recent Labs    11/12/22 1431 11/13/22 0147 11/14/22 0139 11/15/22 0156  NA 136 137 137 135  K 4.1 3.8 3.1* 3.5  CL 103 106 107 102  CO2 '24 23 23 26  '$ GLUCOSE 137* 137* 126* 126*  BUN '17 19 18 19  '$ CALCIUM 8.6* 8.7* 8.3* 8.3*  CREATININE 0.94 0.93 0.87 0.79  GFRNONAA >60 >60 >60 >60    LIVER FUNCTION TESTS: Recent Labs    07/14/22 0943 11/14/22 0139 11/15/22 0156  BILITOT 4.3* 3.9* 3.9*  AST 35 32 33  ALT '23 18 19  '$ ALKPHOS 112 61 62  PROT 6.4 5.4* 5.5*  ALBUMIN 3.6* 2.5* 2.4*    TUMOR MARKERS: No results for input(s): "AFPTM", "CEA", "CA199", "CHROMGRNA" in the last 8760 hours.  Assessment and Plan:  NASH cirrhosis with portal hypertension, large gastroesophageal varices, multiple portosystemic shunts and main portal vein chronic thrombus: [name], 67 year old female, presents today to the [facility] department for an image-guided TIPS creation, portal vein recanalization and variceal embolization. This procedure will be done under general anesthesia with planned overnight observation.  Risks and benefits of TIPS, BRTO and/or additional variceal embolization were discussed with the patient and/or the patient's family including, but not limited to, infection, bleeding, damage to adjacent structures, worsening hepatic and/or cardiac function, non-target embolization and death.   This interventional procedure involves the use of X-rays and because of the nature of the planned procedure, it is possible that we will have prolonged use of X-ray fluoroscopy. Potential radiation risks to you include (but are not limited to) the following: - A slightly elevated risk for cancer  several years later in life. This risk is typically less than 0.5% percent. This risk  is low in comparison to the normal incidence of human cancer, which is 33% for women and 50% for men according to the [employer]. - Radiation induced injury can include skin redness, resembling a rash, tissue breakdown / ulcers and hair loss (which can be temporary or permanent).  The likelihood of either of these occurring depends on the difficulty of the procedure and whether you are sensitive to radiation due to previous procedures, disease, or genetic conditions.  IF your procedure requires a prolonged use of radiation, you will be notified and given written instructions for further action.  It is your responsibility to monitor the irradiated area for the 2 weeks following the procedure and to notify your physician if you are concerned that you have suffered a radiation induced injury.    All of the patient's questions were answered, patient is agreeable to proceed. She has been NPO. She does not take any blood-thinning medications. She is a full code.   Consent signed and in chart.  Thank you for this interesting consult.  I greatly enjoyed meeting Hannah Khan and look forward to participating in their care.  A copy of this report was sent to the requesting provider on this  date.  Electronically Signed: Soyla Dryer, AGACNP-BC [phone] 11/18/2022, 1:45 PM   I spent a total of  30 Minutes   in face to face in clinical consultation, greater than 50% of which was counseling/coordinating care for TIPS

## 2022-11-21 ENCOUNTER — Inpatient Hospital Stay (HOSPITAL_COMMUNITY): Admission: RE | Admit: 2022-11-21 | Payer: Medicare PPO | Source: Ambulatory Visit

## 2022-11-21 ENCOUNTER — Encounter (HOSPITAL_COMMUNITY): Payer: Self-pay

## 2022-11-21 ENCOUNTER — Inpatient Hospital Stay (HOSPITAL_COMMUNITY): Admission: RE | Admit: 2022-11-21 | Payer: Medicare PPO | Source: Home / Self Care | Admitting: Interventional Radiology

## 2022-11-21 HISTORY — DX: Cardiac murmur, unspecified: R01.1

## 2022-11-21 HISTORY — DX: Personal history of other medical treatment: Z92.89

## 2022-11-21 HISTORY — DX: Essential (primary) hypertension: I10

## 2022-11-21 HISTORY — DX: Dyspnea, unspecified: R06.00

## 2022-11-21 SURGERY — IR WITH ANESTHESIA
Anesthesia: General

## 2022-11-23 DIAGNOSIS — E119 Type 2 diabetes mellitus without complications: Secondary | ICD-10-CM | POA: Diagnosis not present

## 2022-11-23 DIAGNOSIS — I700 Atherosclerosis of aorta: Secondary | ICD-10-CM | POA: Diagnosis not present

## 2022-11-23 DIAGNOSIS — K766 Portal hypertension: Secondary | ICD-10-CM | POA: Diagnosis not present

## 2022-11-23 DIAGNOSIS — I5032 Chronic diastolic (congestive) heart failure: Secondary | ICD-10-CM | POA: Diagnosis not present

## 2022-11-23 DIAGNOSIS — I4821 Permanent atrial fibrillation: Secondary | ICD-10-CM | POA: Diagnosis not present

## 2022-11-23 DIAGNOSIS — D696 Thrombocytopenia, unspecified: Secondary | ICD-10-CM | POA: Diagnosis not present

## 2022-11-23 NOTE — Discharge Summary (Signed)
Discharge summary entered in error as progress note 2/27.  See progress note.  Copied and pasted into this note.  Physician Discharge Summary  Hannah Khan [id number] DOB: 11-Feb-1956 DOA: 11/12/2022   PCP: Hannah Amel, MD   Admit date: 11/12/2022 Discharge date: 11/15/2022   Time spent: 40 minutes   Recommendations for Outpatient Follow-up:  Follow outpatient CBC/CMP  Follow cellulitis outpatient Follow CXR outpatient  Follow thrombocytopenia outpatient Follow afib/HR outpatient Follow cirrhosis outpatient, upcoming tips    Discharge Diagnoses:  Principal Problem:   Sepsis [facility]) Active Problems:   Abdominal wall cellulitis   Liver cirrhosis secondary to NASH (nonalcoholic steatohepatitis) ([place])     Discharge Condition: stable   Diet recommendation: heart healthy, diabetic       Filed Weights    11/12/22 1416 11/12/22 2137  Weight: (!) 161.9 kg (!) 158.8 kg      History of present illness:    Hannah Khan is Hannah Khan 67 y.o. female with medical history significant of chronic HFpEF, morbid obesity, OSA, NASH/cirrhosis with chronic thrombocytopenia, IDDM, presented with worsening of rash and pain of abdominal wall.    She was admitted for cellulitis.  She's improved with IV antibiotics.  Discharged 2/27 on duricef.   Hospital Course:  Assessment and Plan:   Sepsis due to Cellulitis presented with fever, tachycardia, tachypena on abdominal wall --- improved low threshold for imaging if not improving or worsening -ancef 2/25 - 2/27.  Discharge with 7 days duricef. -blood cultures NGx2   Hannah Khan-fib with RVR Rate controlled, continue metop and diltiazem Given the significant history of cirrhosis and chronic thrombocytopenia, not Hannah Khan candidate for systemic anticoagulation.   Pulmonary Vascular Congestion continue home bumex   NASH/cirrhosis resume home bumex She was cleared by cardiology for TIPS, follow with IR for this procedure Continue Xifaxan and lactulose    Morbid obesity Outpatient bariatric evaluation   IIDM Resume home meds at discharge   Chronic thrombocytopenia Hold off chemical DVT prophylaxis Will trend    Obesity Body mass index is 54.83 kg/m.   Musculoskeletal Neck Pain Heating pack        Procedures: none    Consultations: none   Discharge Exam:     Vitals:    11/15/22 1051 11/15/22 1119  BP: (!) 115/59 117/61  Pulse:   72  Resp:   20  Temp:   98.1 F (36.7 C)  SpO2:   98%    Feeling better Redness improving   General: No acute distress. obese Cardiovascular: irregular, rate in 80's Lungs:unlabored Abdomen: Soft, nontender, nondistended  Neurological: Alert and oriented 3. Moves all extremities 4 with equal strength. Cranial nerves II through XII grossly intact. Skin: improved redness to abdomen, cellulitis improving Extremities: No clubbing or cyanosis. No edema.    Discharge Instructions     Discharge Instructions       Call MD for:  difficulty breathing, headache or visual disturbances   Complete by: As directed      Call MD for:  extreme fatigue   Complete by: As directed      Call MD for:  hives   Complete by: As directed      Call MD for:  persistant dizziness or light-headedness   Complete by: As directed      Call MD for:  persistant nausea and vomiting   Complete by: As directed      Call MD for:  redness, tenderness, or signs of infection (pain, swelling, redness, odor or green/yellow  discharge around incision site)   Complete by: As directed      Call MD for:  severe uncontrolled pain   Complete by: As directed      Call MD for:  temperature >100.4   Complete by: As directed      Diet - low sodium heart healthy   Complete by: As directed      Discharge instructions   Complete by: As directed      You were seen for cellulitis of your abdomen.   This has improved with IV antibiotics.  We'll discharge you with 7 days of duricef to complete Hannah Khan course of antibiotics.   Follow  up with your outpatient doctors as planned.  Repeat Hannah Khan chest x ray outpatient in Hannah Khan few weeks.   Return for new, recurrent, or worsening symptoms.   Please ask your PCP to request records from this hospitalization so they know what was done and what the next steps will be.    Increase activity slowly   Complete by: As directed           Allergies as of 11/15/2022         Reactions    Celecoxib Other (See Comments)    "speech slurred" with celebrex    Shellfish Allergy Swelling, Rash    TINGLING AND SWELLING OF LIPS. LARGE WHELPS QUARTER-SIZE    Barium-containing Compounds Other (See Comments)    TACHYCARDIA    Prednisone Other (See Comments)    TACHYCARDIA    Statins Other (See Comments)    AGGRAVATED FIBROMYALGIA    Fentanyl      Hallucinations     Ibuprofen Other (See Comments)    UNSPECIFIED SPECIFIC REACTION >> "DUE TO PLATELETS"    Tramadol      Hallucination    Tylenol [acetaminophen] Other (See Comments)    UNSPECIFIED SPECIFIC REACTION >> "DUE TO PLATELETS"    Zetia [ezetimibe]      Headache            Medication List       TAKE these medications     albuterol (2.5 MG/3ML) 0.083% nebulizer solution Commonly known as: PROVENTIL Take 3 mLs (2.5 mg total) by nebulization every 6 (six) hours as needed for wheezing or shortness of breath.    benzonatate 100 MG capsule Commonly known as: Tessalon Perles Take 1 capsule (100 mg total) by mouth 3 (three) times daily as needed for cough.    bumetanide 2 MG tablet Commonly known as: BUMEX Take 1 tablet (2 mg total) by mouth 2 (two) times daily. What changed: when to take this    cefadroxil 500 MG capsule Commonly known as: DURICEF Take 1 capsule (500 mg total) by mouth 2 (two) times daily for 7 days.    diltiazem 180 MG 24 hr capsule Commonly known as: CARDIZEM CD TAKE 1 CAPSULE BY MOUTH EVERY DAY What changed: how much to take    empagliflozin 25 MG Tabs tablet Commonly known as: JARDIANCE Take 25 mg by  mouth daily.    lactulose 10 GM/15ML solution Commonly known as: CHRONULAC Take 30 mLs by mouth 3 (three) times daily.    metolazone 5 MG tablet Commonly known as: ZAROXOLYN TAKE 1 TABLE EVERY 3 DAYS AS NEEDED FOR MORE THAN 4 POUND WEIGHT GAIN. What changed: See the new instructions.    metoprolol succinate 50 MG 24 hr tablet Commonly known as: TOPROL-XL TAKE 3 TABLETS ONCE Zachariah Pavek DAY Take with or immediately following Waverly Tarquinio meal.  What changed:  how much to take how to take this when to take this    NEOSPORIN EX Apply 1 Application topically daily as needed (wound Treatment).    OneTouch Delica Plus 0000000 Misc Apply topically 3 (three) times daily.    OneTouch Verio test strip Generic drug: glucose blood 1 each 3 (three) times daily.    Ozempic (1 MG/DOSE) 4 MG/3ML Sopn Generic drug: Semaglutide (1 MG/DOSE) Inject 1 mg into the skin every Thursday. What changed: Another medication with the same name was changed. Make sure you understand how and when to take each.    Ozempic (2 MG/DOSE) 8 MG/3ML Sopn Generic drug: Semaglutide (2 MG/DOSE) Inject 2 mg into the skin once Jacob Chamblee week. What changed: when to take this    potassium chloride 10 MEQ CR capsule Commonly known as: MICRO-K Take 40 mEq by mouth 2 (two) times daily.    PreserVision AREDS 2 Caps Take 1 capsule by mouth 2 (two) times daily.    Systane Balance 0.6 % Soln Generic drug: Propylene Glycol Place 1 drop into both eyes 2 (two) times daily.    Xifaxan 550 MG Tabs tablet Generic drug: rifaximin Take 550 mg by mouth 2 (two) times daily.                Allergies  Allergen Reactions   Celecoxib Other (See Comments)      "speech slurred" with celebrex   Shellfish Allergy Swelling and Rash      TINGLING AND SWELLING OF LIPS. LARGE WHELPS QUARTER-SIZE   Barium-Containing Compounds Other (See Comments)      TACHYCARDIA   Prednisone Other (See Comments)      TACHYCARDIA   Statins Other (See Comments)       AGGRAVATED FIBROMYALGIA   Fentanyl        Hallucinations    Ibuprofen Other (See Comments)      UNSPECIFIED SPECIFIC REACTION >> "DUE TO PLATELETS"   Tramadol        Hallucination     Tylenol [Acetaminophen] Other (See Comments)      UNSPECIFIED SPECIFIC REACTION >> "DUE TO PLATELETS"   Zetia [Ezetimibe]        Headache          The results of significant diagnostics from this hospitalization (including imaging, microbiology, ancillary and laboratory) are listed below for reference.     Significant Diagnostic Studies:  Imaging Results  DG Chest Port 1 View   Result Date: 11/12/2022 CLINICAL DATA:  Shortness of breath EXAM: PORTABLE CHEST 1 VIEW COMPARISON:  03/29/2021 FINDINGS: UPPER limits normal heart size noted. Pulmonary vascular congestion is identified. LEFT retrocardiac opacity may be artifactual or represent consolidation/atelectasis. There is no evidence pneumothorax or large pleural effusion. No acute bony abnormalities are noted. IMPRESSION: 1. LEFT retrocardiac opacity which may be artifactual or represent consolidation/atelectasis. Consider PA and LATERAL chest for further evaluation. 2. Pulmonary vascular congestion. Electronically Signed   By: Margarette Canada M.D.   On: 11/12/2022 15:10    IR Radiologist Eval & Mgmt   Result Date: 11/02/2022 EXAM: ESTABLISHED PATIENT OFFICE VISIT CHIEF COMPLAINT: See Epic note. HISTORY OF PRESENT ILLNESS: See Epic note. REVIEW OF SYSTEMS: See Epic note. PHYSICAL EXAMINATION: See Epic note. ASSESSMENT AND PLAN: See Epic note. Ruthann Cancer, MD [facility] Rad[facility] Electronically Signed   By: Ruthann Cancer M.D.   On: 11/02/2022 12:24    ECHOCARDIOGRAM COMPLETE   Result Date: 11/01/2022    ECHOCARDIOGRAM  REPORT   Patient Name:   DESHANDA AMERI Date of Exam: 11/01/2022 Medical Rec #:  [id number]       Height:       67.0 in Accession #:    [id number]      Weight:       339.9 lb Date of Birth:   03/12/1956       BSA:          2.534 m Patient Age:    34 years        BP:           129/89 mmHg Patient Gender: F               HR:           79 bpm. Exam Location:  [address] Procedure: 2D Echo, Cardiac Doppler, Color Doppler and Intracardiac            Opacification Agent Indications:    Z01.810 Pre-Op  History:        Patient has prior history of Echocardiogram examinations, most                 recent 03/20/2021. CHF, Arrythmias:Atrial Fibrillation; Risk                 Factors:Hypertension, Sleep Apnea and Diabetes.  Sonographer:    Marygrace Drought RCS Referring Phys: [id number] [place]  1. Left ventricular ejection fraction, by estimation, is 60 to 65%. The left ventricle has normal function. The left ventricle has no regional wall motion abnormalities. Left ventricular diastolic function could not be evaluated.  2. Right ventricular systolic function is normal. The right ventricular size is normal. There is normal pulmonary artery systolic pressure.  3. Left atrial size was mildly dilated.  4. The mitral valve is normal in structure. Trivial mitral valve regurgitation. No evidence of mitral stenosis.  5. The aortic valve was not well visualized. Aortic valve regurgitation is not visualized. No aortic stenosis is present.  6. The inferior vena cava is normal in size with greater than 50% respiratory variability, suggesting right atrial pressure of 3 mmHg. FINDINGS  Left Ventricle: Left ventricular ejection fraction, by estimation, is 60 to 65%. The left ventricle has normal function. The left ventricle has no regional wall motion abnormalities. The left ventricular internal cavity size was normal in size. There is  no left ventricular hypertrophy. Left ventricular diastolic function could not be evaluated due to atrial fibrillation. Left ventricular diastolic function could not be evaluated. Right Ventricle: The right ventricular size is normal. No increase in right ventricular wall thickness.  Right ventricular systolic function is normal. There is normal pulmonary artery systolic pressure. The tricuspid regurgitant velocity is 2.66 m/s, and  with an assumed right atrial pressure of 3 mmHg, the estimated right ventricular systolic pressure is 123456 mmHg. Left Atrium: Left atrial size was mildly dilated. Right Atrium: Right atrial size was normal in size. Pericardium: There is no evidence of pericardial effusion. Mitral Valve: The mitral valve is normal in structure. Trivial mitral valve regurgitation. No evidence of mitral valve stenosis. Tricuspid Valve: The tricuspid valve is normal in structure. Tricuspid valve regurgitation is trivial. No evidence of tricuspid stenosis. Aortic Valve: The aortic valve was not well visualized. Aortic valve regurgitation is not visualized. No aortic stenosis is present. Pulmonic Valve: The pulmonic valve was normal in structure. Pulmonic valve regurgitation is not visualized. No evidence of pulmonic stenosis. Aorta: The aortic root is normal in  size and structure. Venous: The inferior vena cava is normal in size with greater than 50% respiratory variability, suggesting right atrial pressure of 3 mmHg. IAS/Shunts: No atrial level shunt detected by color flow Doppler.  LEFT VENTRICLE PLAX 2D LVIDd:         4.90 cm   Diastology LVIDs:         3.50 cm   LV e' medial:    11.40 cm/s LV PW:         1.30 cm   LV E/e' medial:  12.4 LV IVS:        1.00 cm   LV e' lateral:   10.40 cm/s LVOT diam:     2.00 cm   LV E/e' lateral: 13.6 LV SV:         51 LV SV Index:   20 LVOT Area:     3.14 cm  RIGHT VENTRICLE RV Basal diam:  3.40 cm RV S prime:     12.60 cm/s TAPSE (M-mode): 1.4 cm RVSP:           31.3 mmHg LEFT ATRIUM             Index        RIGHT ATRIUM           Index LA diam:        4.40 cm 1.74 cm/m   RA Pressure: 3.00 mmHg LA Vol (A2C):   77.1 ml 30.43 ml/m  RA Area:     22.70 cm LA Vol (A4C):   93.7 ml 36.98 ml/m  RA Volume:   72.40 ml  28.58 ml/m LA Biplane Vol: 89.3 ml  35.25 ml/m  AORTIC VALVE LVOT Vmax:   79.80 cm/s LVOT Vmean:  53.800 cm/s LVOT VTI:    0.162 m  AORTA Ao Root diam: 2.70 cm Ao Asc diam:  3.40 cm MITRAL VALVE                TRICUSPID VALVE MV Area (PHT):              TR Peak grad:   28.3 mmHg MV Decel Time:              TR Vmax:        266.00 cm/s MV E velocity: 141.50 cm/s  Estimated RAP:  3.00 mmHg                             RVSP:           31.3 mmHg                              SHUNTS                             Systemic VTI:  0.16 m                             Systemic Diam: 2.00 cm Skeet Latch MD Electronically signed by Skeet Latch MD Signature Date/Time: 11/01/2022/5:54:37 PM    Final        Microbiology:        Recent Results (from the past 240 hour(s))  Culture, blood (Routine X 2) w Reflex to ID Panel     Status: None (Preliminary result)    Collection Time:  11/12/22  2:39 PM    Specimen: BLOOD RIGHT ARM  Result Value Ref Range Status    Specimen Description BLOOD RIGHT ARM   Final    Special Requests     Final      BOTTLES DRAWN AEROBIC AND ANAEROBIC Blood Culture results may not be optimal due to an inadequate volume of blood received in culture bottles    Culture     Final      NO GROWTH 3 DAYS Performed at [facility], [place] [address], [place], [place] [address]      Report Status PENDING   Incomplete  Group Kavari Parr[place] Strep by PCR     Status: None    Collection Time: 11/12/22  5:41 PM    Specimen: Throat; Sterile Swab  Result Value Ref Range Status    Group Brinae [place] Strep by PCR NOT DETECTED NOT DETECTED Final      Comment: Performed at [facility], [place] [address] [place], [place] [address]  MRSA Next Gen by PCR, Nasal     Status: None    Collection Time: 11/12/22  5:46 PM  Result Value Ref Range Status    MRSA by PCR Next Gen NOT DETECTED NOT DETECTED Final      Comment: (NOTE) The GeneXpert MRSA Assay (FDA approved for NASAL specimens only), is one component of Karne Ozga comprehensive MRSA colonization  surveillance program. It is not intended to diagnose MRSA infection nor to guide or monitor treatment for MRSA infections. Test performance is not FDA approved in patients less than 45 years old. Performed at [facility], [place] [address], [place], [place] [address]    Culture, blood (Routine X 2) w Reflex to ID Panel     Status: None (Preliminary result)    Collection Time: 11/12/22 10:16 PM    Specimen: BLOOD LEFT HAND  Result Value Ref Range Status    Specimen Description BLOOD LEFT HAND   Final    Special Requests     Final      BOTTLES DRAWN AEROBIC AND ANAEROBIC Blood Culture adequate volume    Culture     Final      NO GROWTH 3 DAYS Performed at [facility], [place] [address], [place], [place] [address]      Report Status PENDING   Incomplete      Labs: Basic Metabolic Panel: Last Labs        Recent Labs  Lab 11/12/22 1431 11/13/22 0147 11/14/22 0139 11/15/22 0156  NA 136 137 137 135  K 4.1 3.8 3.1* 3.5  CL 103 106 107 102  CO2 '24 23 23 26  '$ GLUCOSE 137* 137* 126* 126*  BUN '17 19 18 19  '$ CREATININE 0.94 0.93 0.87 0.79  CALCIUM 8.6* 8.7* 8.3* 8.3*  MG  --   --  1.8 1.8  PHOS  --   --  2.5 2.4*      Liver Function Tests: Last Labs      Recent Labs  Lab 11/14/22 0139 11/15/22 0156  AST 32 33  ALT 18 19  ALKPHOS 61 62  BILITOT 3.9* 3.9*  PROT 5.4* 5.5*  ALBUMIN 2.5* 2.4*      Last Labs  No results for input(s): "LIPASE", "AMYLASE" in the last 168 hours.   Last Labs  No results for input(s): "AMMONIA" in the last 168 hours.   CBC: Last Labs         Recent Labs  Lab 11/12/22 1431 11/13/22 0147 11/14/22 0139 11/14/22 1540 11/15/22 0156  WBC 11.2* 6.9 2.9* 4.4 3.6*  NEUTROABS 10.3*  --  2.2  --  2.5  HGB 14.0 13.4 11.7* 12.8 11.8*  HCT 40.4 40.9 34.7* 38.4 34.1*  MCV 97.3 99.8 96.1 96.2 94.7  PLT 27* 22* PLATELET CLUMPS NOTED ON SMEAR, UNABLE TO ESTIMATE 26* 23*      Cardiac Enzymes: Last Labs  No results for input(s):  "CKTOTAL", "CKMB", "CKMBINDEX", "TROPONINI" in the last 168 hours.   BNP: BNP (last 3 results) Recent Labs (within last 365 days)     Recent Labs    11/12/22 1443  BNP 434.3*        ProBNP (last 3 results) Recent Labs (within last 365 days)  No results for input(s): "PROBNP" in the last 8760 hours.     CBG: Last Labs         Recent Labs  Lab 11/14/22 1210 11/14/22 1529 11/14/22 2228 11/15/22 0750 11/15/22 1118  GLUCAP 111* 143* 117* 135* 139*              Signed:   Fayrene Helper MD.  [facility] 11/15/2022, 2:31 PM

## 2022-11-24 DIAGNOSIS — A419 Sepsis, unspecified organism: Secondary | ICD-10-CM | POA: Diagnosis not present

## 2022-11-24 DIAGNOSIS — I4819 Other persistent atrial fibrillation: Secondary | ICD-10-CM | POA: Diagnosis not present

## 2022-11-24 DIAGNOSIS — L03311 Cellulitis of abdominal wall: Secondary | ICD-10-CM | POA: Diagnosis not present

## 2022-11-24 DIAGNOSIS — M170 Bilateral primary osteoarthritis of knee: Secondary | ICD-10-CM | POA: Diagnosis not present

## 2022-11-24 DIAGNOSIS — I5032 Chronic diastolic (congestive) heart failure: Secondary | ICD-10-CM | POA: Diagnosis not present

## 2022-11-24 DIAGNOSIS — D649 Anemia, unspecified: Secondary | ICD-10-CM | POA: Diagnosis not present

## 2022-11-24 DIAGNOSIS — G4733 Obstructive sleep apnea (adult) (pediatric): Secondary | ICD-10-CM | POA: Diagnosis not present

## 2022-11-24 DIAGNOSIS — E119 Type 2 diabetes mellitus without complications: Secondary | ICD-10-CM | POA: Diagnosis not present

## 2022-11-24 DIAGNOSIS — M797 Fibromyalgia: Secondary | ICD-10-CM | POA: Diagnosis not present

## 2022-11-30 DIAGNOSIS — L03311 Cellulitis of abdominal wall: Secondary | ICD-10-CM | POA: Diagnosis not present

## 2022-11-30 DIAGNOSIS — G4733 Obstructive sleep apnea (adult) (pediatric): Secondary | ICD-10-CM | POA: Diagnosis not present

## 2022-11-30 DIAGNOSIS — I4819 Other persistent atrial fibrillation: Secondary | ICD-10-CM | POA: Diagnosis not present

## 2022-11-30 DIAGNOSIS — D649 Anemia, unspecified: Secondary | ICD-10-CM | POA: Diagnosis not present

## 2022-11-30 DIAGNOSIS — M170 Bilateral primary osteoarthritis of knee: Secondary | ICD-10-CM | POA: Diagnosis not present

## 2022-11-30 DIAGNOSIS — I5032 Chronic diastolic (congestive) heart failure: Secondary | ICD-10-CM | POA: Diagnosis not present

## 2022-11-30 DIAGNOSIS — A419 Sepsis, unspecified organism: Secondary | ICD-10-CM | POA: Diagnosis not present

## 2022-11-30 DIAGNOSIS — E119 Type 2 diabetes mellitus without complications: Secondary | ICD-10-CM | POA: Diagnosis not present

## 2022-11-30 DIAGNOSIS — M797 Fibromyalgia: Secondary | ICD-10-CM | POA: Diagnosis not present

## 2022-12-02 ENCOUNTER — Ambulatory Visit (HOSPITAL_BASED_OUTPATIENT_CLINIC_OR_DEPARTMENT_OTHER): Payer: Medicare PPO | Admitting: Family

## 2022-12-02 NOTE — Progress Notes (Deleted)
Office Visit    Patient Name: Hannah Khan Date of Encounter: 12/02/2022  PCP:  Hannah Khan, [place]  Cardiologist:  Hannah Latch, MD  Advanced Practice Provider:  No care team member to display Electrophysiologist:  None   Chief Complaint    Hannah Khan is a 67 y.o. female presents today for heart failure follow up.  Past Medical History    Past Medical History:  Diagnosis Date   Anemia    hx of   Arthritis    knees   Diabetes mellitus without complication (HCC)    type 2   Dyspnea    Fibromyalgia    H/O transfusion of platelets    Heart murmur    never has caused any problems   Hx of colonic polyps    s/p partial colectomy   Hyperlipidemia    Hypertension    Joint pain    in knees and back spasms   Left leg swelling    wear compression hose   Liver cirrhosis secondary to NASH (nonalcoholic steatohepatitis) (Hannah Khan) dx nov 2014   had enlarged spleen also    Morbid obesity ([place])    Persistent atrial fibrillation (Hannah Khan)    Pneumonia 10/2016   x 5   PONV (postoperative nausea and vomiting)    in past none recent   Portal hypertension ([place])    Sleep apnea    uses cpap, setting varies between 14-20   Spleen enlarged    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 Hannah Khan)    Past Surgical History:  Procedure Laterality Date   BREAST LUMPECTOMY WITH RADIOACTIVE SEED LOCALIZATION Right 08/29/2017   Procedure: RIGHT BREAST LUMPECTOMY WITH RADIOACTIVE SEEDS LOCALIZATION;  Surgeon: Erroll Luna, MD;  Location: [facility];  Service: General;  Laterality: Right;   [place]  20 yrs ago   COLECTOMY  2011   COLONOSCOPY     COLONOSCOPY N/A 09/05/2018   Procedure: COLONOSCOPY;  Surgeon: [name], MD;  Location: [facility];  Service: Endoscopy;  Laterality: N/A;   COLONOSCOPY WITH PROPOFOL Bilateral 05/04/2022   Procedure: COLONOSCOPY WITH PROPOFOL;  Surgeon: [name], MD;  Location: [facility];  Service: Gastroenterology;  Laterality: Bilateral;   DILATATION & CURETTAGE/HYSTEROSCOPY WITH MYOSURE N/A 05/06/2016   Procedure: DILATATION & CURETTAGE/HYSTEROSCOPY WITH MYOSURE WITH POLYPECTOMY;  Surgeon: Janyth Pupa, DO;  Location: [facility];  Service: Gynecology;  Laterality: N/A;   ESOPHAGOGASTRODUODENOSCOPY (EGD) WITH PROPOFOL N/A 09/04/2013   Procedure: ESOPHAGOGASTRODUODENOSCOPY (EGD) WITH PROPOFOL;  Surgeon: [name], MD;  Location: [facility];  Service: Endoscopy;  Laterality: N/A;   ESOPHAGOGASTRODUODENOSCOPY (EGD) WITH PROPOFOL Bilateral 05/04/2022   Procedure: ESOPHAGOGASTRODUODENOSCOPY (EGD) WITH PROPOFOL;  Surgeon: [name], MD;  Location: [facility];  Service: Gastroenterology;  Laterality: Bilateral;   IR RADIOLOGIST EVAL & MGMT  09/21/2022   IR RADIOLOGIST EVAL & MGMT  10/06/2022   IR RADIOLOGIST EVAL & MGMT  11/02/2022   KNEE ARTHROSCOPY  yrs ago   bilateral, one done x 1, one done twice   POLYPECTOMY  09/05/2018   Procedure: POLYPECTOMY;  Surgeon: [name], MD;  Location: [facility];  Service: Endoscopy;;    Allergies  Allergies  Allergen Reactions   Celecoxib Other (See Comments)    "speech slurred" with celebrex   Shellfish Allergy Swelling and Rash    TINGLING AND SWELLING OF LIPS. LARGE WHELPS QUARTER-SIZE   Barium-Containing Compounds Other (See Comments)    TACHYCARDIA   Prednisone  Other (See Comments)    TACHYCARDIA   Statins Other (See Comments)    AGGRAVATED FIBROMYALGIA   Fentanyl     Hannah Khan    Ibuprofen Other (See Comments)    UNSPECIFIED SPECIFIC REACTION >> "DUE TO PLATELETS"   Tramadol     Hallucination    Tylenol [Acetaminophen] Other (See Comments)    UNSPECIFIED SPECIFIC REACTION >> "DUE TO PLATELETS"   Zetia [Ezetimibe]     Headache    History of Present Illness    Hannah Khan is a 67 y.o. female with a hx of chronic diastolic heart failure, morbid obesity, OSA, NASH/cirrhosis, chronic  thrombocytopenia, DM2, vasovagal presyncope, chronic atrial fibrillation not on anticoagulation last seen 11/03/22.  ED visit 02/2020 with atrial fibrillation requiring diltiazem gtt. At clinic follow up July 2021 noted dyspnea and fluid retention, Bumex increased for short time. At follow up 04/2020 weight and edema were improved.  She saw Dr. Lovena Le 07/2020 and decided upon medical management as she was overall asymptomatic.   Admitted 01/2021 with atrial fibrillation and hepatic encephalopathy. Seen in follow up 02/17/21 doing well. Admitted 03/17/21-03/22/21 with fever and hypoxia treated for community acquired pneumonia. Retired July 1st, 2022 from [employer] working as a [employer] program. Readmitted 03/2021 with acute on chronic diastolic heart failure suspected due to dietary indiscretion. Hannah Khan was started. Bumex 2mg  daily continued with Metolazone PRN for weight gain. She did have episode of vasovagal presyncope associated with IV magnesium with CT head and neurology consult unremarkable. She saw Dr. Oval Linsey 06/14/21 - Diltaizem reduced due to hypotension and Metoprolol increased to 75mg  BID. Spironolactone previously trialed but had to be discontinued due to hypotension.  Her diltiazem has been gradually reduced due to hypotension.  Bumex and metolazone have been escalated.    She saw Dr. Oval Linsey 07/14/22 with dizziness more attributed to multiple vertigo given information on Epley maneuver.  She was a bit volume overloaded as she had not been taking her Bumex due to travel.  She was encouraged to resume her previous regimen.  Echocardiogram 11/01/2022 as part of preoperative workup for TIPS with LVEF 60 to 65%, no RWMA, LA mildly dilated, no significant valvular abnormalities. At clinic visit 11/03/22 she had recovering from RSV earlier in the month and had a weight gain of 18 pounds over the previous 4 months.  She had self discontinued her diuretic 9 days prior and increased her fluid intake  as she was trying to hydrate for labs.  Medication compliance was reiterated and she was recommended to take an extra dose of her metolazone.  Subsequently admitted 2/24 - 11/15/2022 with cellulitis of the abdominal wall requiring IV antibiotics.  Presents today for follow up independently. ***  EKGs/Labs/Other Studies Reviewed:   The following studies were reviewed today:   Cardiac Studies & Procedures       ECHOCARDIOGRAM  ECHOCARDIOGRAM COMPLETE 11/01/2022  Narrative ECHOCARDIOGRAM REPORT    Patient Name:   Hannah Khan Date of Exam: 11/01/2022 Medical Rec #:  [id number]       Height:       67.0 in Accession #:    [id number]      Weight:       339.9 lb Date of Birth:  09/21/1955       BSA:          2.534 m Patient Age:    33 years        BP:  129/89 mmHg Patient Gender: F               HR:           79 bpm. Exam Location:  [address]  Procedure: 2D Echo, Cardiac Doppler, Color Doppler and Intracardiac Opacification Agent  Indications:    Z01.810 Pre-Op  History:        Patient has prior history of Echocardiogram examinations, most recent 03/20/2021. CHF, Arrythmias:Atrial Fibrillation; Risk Factors:Hypertension, Sleep Apnea and Diabetes.  Sonographer:    Marygrace Drought RCS Referring Phys: [id number] [place]   1. Left ventricular ejection fraction, by estimation, is 60 to 65%. The left ventricle has normal function. The left ventricle has no regional wall motion abnormalities. Left ventricular diastolic function could not be evaluated. 2. Right ventricular systolic function is normal. The right ventricular size is normal. There is normal pulmonary artery systolic pressure. 3. Left atrial size was mildly dilated. 4. The mitral valve is normal in structure. Trivial mitral valve regurgitation. No evidence of mitral stenosis. 5. The aortic valve was not well visualized. Aortic valve regurgitation is not visualized. No aortic stenosis is  present. 6. The inferior vena cava is normal in size with greater than 50% respiratory variability, suggesting right atrial pressure of 3 mmHg.  FINDINGS Left Ventricle: Left ventricular ejection fraction, by estimation, is 60 to 65%. The left ventricle has normal function. The left ventricle has no regional wall motion abnormalities. The left ventricular internal cavity size was normal in size. There is no left ventricular hypertrophy. Left ventricular diastolic function could not be evaluated due to atrial fibrillation. Left ventricular diastolic function could not be evaluated.  Right Ventricle: The right ventricular size is normal. No increase in right ventricular wall thickness. Right ventricular systolic function is normal. There is normal pulmonary artery systolic pressure. The tricuspid regurgitant velocity is 2.66 m/s, and with an assumed right atrial pressure of 3 mmHg, the estimated right ventricular systolic pressure is 123456 mmHg.  Left Atrium: Left atrial size was mildly dilated.  Right Atrium: Right atrial size was normal in size.  Pericardium: There is no evidence of pericardial effusion.  Mitral Valve: The mitral valve is normal in structure. Trivial mitral valve regurgitation. No evidence of mitral valve stenosis.  Tricuspid Valve: The tricuspid valve is normal in structure. Tricuspid valve regurgitation is trivial. No evidence of tricuspid stenosis.  Aortic Valve: The aortic valve was not well visualized. Aortic valve regurgitation is not visualized. No aortic stenosis is present.  Pulmonic Valve: The pulmonic valve was normal in structure. Pulmonic valve regurgitation is not visualized. No evidence of pulmonic stenosis.  Aorta: The aortic root is normal in size and structure.  Venous: The inferior vena cava is normal in size with greater than 50% respiratory variability, suggesting right atrial pressure of 3 mmHg.  IAS/Shunts: No atrial level shunt detected by color  flow Doppler.   LEFT VENTRICLE PLAX 2D LVIDd:         4.90 cm   Diastology LVIDs:         3.50 cm   LV e' medial:    11.40 cm/s LV PW:         1.30 cm   LV E/e' medial:  12.4 LV IVS:        1.00 cm   LV e' lateral:   10.40 cm/s LVOT diam:     2.00 cm   LV E/e' lateral: 13.6 LV SV:  51 LV SV Index:   20 LVOT Area:     3.14 cm   RIGHT VENTRICLE RV Basal diam:  3.40 cm RV S prime:     12.60 cm/s TAPSE (M-mode): 1.4 cm RVSP:           31.3 mmHg  LEFT ATRIUM             Index        RIGHT ATRIUM           Index LA diam:        4.40 cm 1.74 cm/m   RA Pressure: 3.00 mmHg LA Vol (A2C):   77.1 ml 30.43 ml/m  RA Area:     22.70 cm LA Vol (A4C):   93.7 ml 36.98 ml/m  RA Volume:   72.40 ml  28.58 ml/m LA Biplane Vol: 89.3 ml 35.25 ml/m AORTIC VALVE LVOT Vmax:   79.80 cm/s LVOT Vmean:  53.800 cm/s LVOT VTI:    0.162 m  AORTA Ao Root diam: 2.70 cm Ao Asc diam:  3.40 cm  MITRAL VALVE                TRICUSPID VALVE MV Area (PHT):              TR Peak grad:   28.3 mmHg MV Decel Time:              TR Vmax:        266.00 cm/s MV E velocity: 141.50 cm/s  Estimated RAP:  3.00 mmHg RVSP:           31.3 mmHg  SHUNTS Systemic VTI:  0.16 m Systemic Diam: 2.00 cm  Hannah Latch MD Electronically signed by Hannah Latch MD Signature Date/Time: 11/01/2022/5:54:37 PM    Final              EKG:  EKG ordered today. EKG performed today revealed rate controlled atrial fibrillation 97 bpm with stable iRBBB.  Recent Labs: 11/12/2022: B Natriuretic Peptide 434.3 11/15/2022: ALT 19; BUN 19; Creatinine, Ser 0.79; Hemoglobin 11.8; Magnesium 1.8; Platelets 23; Potassium 3.5; Sodium 135  Recent Lipid Panel    Component Value Date/Time   CHOL 167 07/14/2022 0943   TRIG 77 07/14/2022 0943   HDL 84 07/14/2022 0943   CHOLHDL 2.0 07/14/2022 0943   CHOLHDL 3.0 07/02/2014 1138   VLDL 19 07/02/2014 1138   LDLCALC 69 07/14/2022 0943    Home Medications   No outpatient  medications have been marked as taking for the 12/02/22 encounter (Appointment) with Loel Dubonnet, NP.     Review of Systems      All other systems reviewed and are otherwise negative except as noted above.  Physical Exam    VS:  There were no vitals taken for this visit. , BMI There is no height or weight on file to calculate BMI.  Wt Readings from Last 3 Encounters:  11/12/22 (!) 350 lb 1.6 oz (158.8 kg)  11/03/22 (!) 357 lb (161.9 kg)  11/02/22 (!) 339 lb (153.8 kg)    GEN: Well nourished, overweight, well developed, in no acute distress. HEENT: normal. Neck: Supple, no JVD, carotid bruits, or masses. Cardiac: IRIR, gr 2/6 systolic murmur, no  rubs or gallops. No clubbing, cyanosis, edema.  Radials/PT 2+ and equal bilaterally.  Respiratory:  Respirations regular and unlabored, clear to auscultation bilaterally. GI: Soft, nontender, nondistended. MS: No deformity or atrophy. Skin: Warm and dry, no rash. Neuro:  Strength and sensation are  intact. Psych: Normal affect.  Assessment & Plan    Preop clearance - Upcoming TIPS with Dr. Serafina Royals. According to the Revised Cardiac Risk Index (RCRI), her  . Her   according to the [place] Activity Status Index (DASI). Per AHA/ACC guidelines, she is deemed acceptable risk for the planned procedure without additional cardiovascular testing. Will route to surgical team so they are aware.    Hx of RSV - Residual cough. Rx Tessalon pearles PRN for cough  Chronic diastolic heart failure - Prir difficulties with compliance. ***weight is up 18 pounds compared to clinic visit 4 months ago.  She self discontinued her diuretic 9 days ago she needed lab work and wanted to be hydrated.  We discussed that she needs to take her metolazone and Bumex regularly as prescribed.  She will take metolazone tomorrow prior to her Bumex and again on Sunday. Low sodium diet, fluid restriction <2L, and daily weights encouraged. Educated to contact our office for weight  gain of 2 lbs overnight or 5 lbs in one week.   Persistent atrial fibrillation - Previously intolerant to digoxin. No anticoagulation due to high bleed risk and cirrhosis.Not candidate for [facility]. Continue Toprol 150mg  QD and diltiazem 180 daily  OSA - CPAP compliance encouraged.   Thrombocytopenia - Follows with Dr. Alen Blew of hematology.   Morbid obesity - Weight loss via diet and exercise encouraged. Discussed the impact being overweight would have on cardiovascular risk. On Ozempic per endocrinology. Previously declined referral to PREP exercise program today.   DM2 - Continue to follow with PCP. Continue Jardiance for cardioprotective and heart failure benefit.   Liver cirrhosis secondary to nonalcoholic steato hepatitis-  Continue to follow with GI and PCP. TIPS upcoming.   Disposition: Follow up in 1 month*** with Dr. Oval Linsey.  Signed, Loel Dubonnet, NP 12/02/2022, 1:19 PM [facility]

## 2022-12-07 DIAGNOSIS — A419 Sepsis, unspecified organism: Secondary | ICD-10-CM | POA: Diagnosis not present

## 2022-12-07 DIAGNOSIS — M170 Bilateral primary osteoarthritis of knee: Secondary | ICD-10-CM | POA: Diagnosis not present

## 2022-12-07 DIAGNOSIS — G4733 Obstructive sleep apnea (adult) (pediatric): Secondary | ICD-10-CM | POA: Diagnosis not present

## 2022-12-07 DIAGNOSIS — L03311 Cellulitis of abdominal wall: Secondary | ICD-10-CM | POA: Diagnosis not present

## 2022-12-07 DIAGNOSIS — I5032 Chronic diastolic (congestive) heart failure: Secondary | ICD-10-CM | POA: Diagnosis not present

## 2022-12-07 DIAGNOSIS — E119 Type 2 diabetes mellitus without complications: Secondary | ICD-10-CM | POA: Diagnosis not present

## 2022-12-07 DIAGNOSIS — I4819 Other persistent atrial fibrillation: Secondary | ICD-10-CM | POA: Diagnosis not present

## 2022-12-07 DIAGNOSIS — D649 Anemia, unspecified: Secondary | ICD-10-CM | POA: Diagnosis not present

## 2022-12-07 DIAGNOSIS — M797 Fibromyalgia: Secondary | ICD-10-CM | POA: Diagnosis not present

## 2022-12-08 DIAGNOSIS — H353221 Exudative age-related macular degeneration, left eye, with active choroidal neovascularization: Secondary | ICD-10-CM | POA: Diagnosis not present

## 2022-12-08 DIAGNOSIS — H35033 Hypertensive retinopathy, bilateral: Secondary | ICD-10-CM | POA: Diagnosis not present

## 2022-12-08 DIAGNOSIS — H43813 Vitreous degeneration, bilateral: Secondary | ICD-10-CM | POA: Diagnosis not present

## 2022-12-08 DIAGNOSIS — E113293 Type 2 diabetes mellitus with mild nonproliferative diabetic retinopathy without macular edema, bilateral: Secondary | ICD-10-CM | POA: Diagnosis not present

## 2022-12-08 DIAGNOSIS — H353112 Nonexudative age-related macular degeneration, right eye, intermediate dry stage: Secondary | ICD-10-CM | POA: Diagnosis not present

## 2022-12-12 ENCOUNTER — Encounter (HOSPITAL_BASED_OUTPATIENT_CLINIC_OR_DEPARTMENT_OTHER): Payer: Self-pay | Admitting: Family

## 2022-12-12 ENCOUNTER — Ambulatory Visit (HOSPITAL_BASED_OUTPATIENT_CLINIC_OR_DEPARTMENT_OTHER): Payer: Medicare PPO | Admitting: Family

## 2022-12-12 VITALS — BP 98/62 | HR 90 | Ht 67.0 in | Wt 332.0 lb

## 2022-12-12 DIAGNOSIS — K7460 Unspecified cirrhosis of liver: Secondary | ICD-10-CM

## 2022-12-12 DIAGNOSIS — G4733 Obstructive sleep apnea (adult) (pediatric): Secondary | ICD-10-CM

## 2022-12-12 DIAGNOSIS — I5032 Chronic diastolic (congestive) heart failure: Secondary | ICD-10-CM | POA: Diagnosis not present

## 2022-12-12 DIAGNOSIS — I4821 Permanent atrial fibrillation: Secondary | ICD-10-CM | POA: Diagnosis not present

## 2022-12-12 DIAGNOSIS — Z01810 Encounter for preprocedural cardiovascular examination: Secondary | ICD-10-CM

## 2022-12-12 DIAGNOSIS — D696 Thrombocytopenia, unspecified: Secondary | ICD-10-CM | POA: Diagnosis not present

## 2022-12-12 DIAGNOSIS — K7581 Nonalcoholic steatohepatitis (NASH): Secondary | ICD-10-CM | POA: Diagnosis not present

## 2022-12-12 NOTE — Progress Notes (Signed)
Office Visit    Patient Name: Hannah Khan Date of Encounter: 12/12/2022  PCP:  Lujean Amel, [place]  Cardiologist:  Skeet Latch, MD  Advanced Practice Provider:  No care team member to display Electrophysiologist:  None   Chief Complaint    Hannah Khan is a 67 y.o. female presents today for hospital follow up.  Past Medical History    Past Medical History:  Diagnosis Date   Anemia    hx of   Arthritis    knees   Diabetes mellitus without complication (HCC)    type 2   Dyspnea    Fibromyalgia    H/O transfusion of platelets    Heart murmur    never has caused any problems   Hx of colonic polyps    s/p partial colectomy   Hyperlipidemia    Hypertension    Joint pain    in knees and back spasms   Left leg swelling    wear compression hose   Liver cirrhosis secondary to NASH (nonalcoholic steatohepatitis) ([place]) dx nov 2014   had enlarged spleen also    Morbid obesity ([place])    Persistent atrial fibrillation ([place])    Pneumonia 10/2016   x 5   PONV (postoperative nausea and vomiting)    in past none recent   Portal hypertension ([place])    Sleep apnea    uses cpap, setting varies between 14-20   Spleen enlarged    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 [facility])    Past Surgical History:  Procedure Laterality Date   BREAST LUMPECTOMY WITH RADIOACTIVE SEED LOCALIZATION Right 08/29/2017   Procedure: RIGHT BREAST LUMPECTOMY WITH RADIOACTIVE SEEDS LOCALIZATION;  Surgeon: Erroll Luna, MD;  Location: [facility];  Service: General;  Laterality: Right;   [place]  20 yrs ago   COLECTOMY  2011   COLONOSCOPY     COLONOSCOPY N/A 09/05/2018   Procedure: COLONOSCOPY;  Surgeon: [name], MD;  Location: [facility];  Service: Endoscopy;  Laterality: N/A;   COLONOSCOPY WITH PROPOFOL Bilateral 05/04/2022   Procedure: COLONOSCOPY WITH PROPOFOL;  Surgeon: [name], MD;  [facility];  Service: Gastroenterology;  Laterality: Bilateral;   DILATATION & CURETTAGE/HYSTEROSCOPY WITH MYOSURE N/A 05/06/2016   Procedure: DILATATION & CURETTAGE/HYSTEROSCOPY WITH MYOSURE WITH POLYPECTOMY;  Surgeon: Janyth Pupa, DO;  Location: [facility];  Service: Gynecology;  Laterality: N/A;   ESOPHAGOGASTRODUODENOSCOPY (EGD) WITH PROPOFOL N/A 09/04/2013   Procedure: ESOPHAGOGASTRODUODENOSCOPY (EGD) WITH PROPOFOL;  Surgeon: [name], MD;  [facility];  Service: Endoscopy;  Laterality: N/A;   ESOPHAGOGASTRODUODENOSCOPY (EGD) WITH PROPOFOL Bilateral 05/04/2022   Procedure: ESOPHAGOGASTRODUODENOSCOPY (EGD) WITH PROPOFOL;  Surgeon: [name], MD;  [facility];  Service: Gastroenterology;  Laterality: Bilateral;   IR RADIOLOGIST EVAL & MGMT  09/21/2022   IR RADIOLOGIST EVAL & MGMT  10/06/2022   IR RADIOLOGIST EVAL & MGMT  11/02/2022   KNEE ARTHROSCOPY  yrs ago   bilateral, one done x 1, one done twice   POLYPECTOMY  09/05/2018   Procedure: POLYPECTOMY;  Surgeon: [name], MD;  [facility];  Service: Endoscopy;;    Allergies  Allergies  Allergen Reactions   Celecoxib Other (See Comments)    "speech slurred" with celebrex   Shellfish Allergy Swelling and Rash    TINGLING AND SWELLING OF LIPS. LARGE WHELPS QUARTER-SIZE   Barium-Containing Compounds Other (See Comments)    TACHYCARDIA   Prednisone Other (  See Comments)    TACHYCARDIA   Statins Other (See Comments)    AGGRAVATED FIBROMYALGIA   Fentanyl     Hallucinations    Ibuprofen Other (See Comments)    UNSPECIFIED SPECIFIC REACTION >> "DUE TO PLATELETS"   Tramadol     Hallucination    Tylenol [Acetaminophen] Other (See Comments)    UNSPECIFIED SPECIFIC REACTION >> "DUE TO PLATELETS"   Zetia [Ezetimibe]     Headache    History of Present Illness    Hannah Khan is a 67 y.o. female with a hx of chronic diastolic heart failure, morbid obesity, OSA, NASH/cirrhosis, chronic  thrombocytopenia, DM2, vasovagal presyncope, chronic atrial fibrillation not on anticoagulation last seen 11/03/22  ED visit 02/2020 with atrial fibrillation requiring diltiazem gtt. At clinic follow up July 2021 noted dyspnea and fluid retention, Bumex increased for short time. At follow up 04/2020 weight and edema were improved.  She saw Dr. Lovena Le 07/2020 and decided upon medical management as she was overall asymptomatic.   Admitted 01/2021 with atrial fibrillation and hepatic encephalopathy. Seen in follow up 02/17/21 doing well. Admitted 03/17/21-03/22/21 with fever and hypoxia treated for community acquired pneumonia. Retired July 1st, 2022 from [employer] working as a [employer] program. Readmitted 03/2021 with acute on chronic diastolic heart failure suspected due to dietary indiscretion. Wilder Glade was started. Bumex 2mg  daily continued with Metolazone PRN for weight gain. She did have episode of vasovagal presyncope associated with IV magnesium with CT head and neurology consult unremarkable. She saw Dr. Oval Linsey 06/14/21 - Diltaizem reduced due to hypotension and Metoprolol increased to 75mg  BID. Spironolactone previously trialed but had to be discontinued due to hypotension.  Her diltiazem has been gradually reduced due to hypotension.  Bumex and metolazone have been escalated.    She last saw Dr. Oval Linsey 07/14/22 with dizziness more attributed to multiple vertigo given information on Epley maneuver.  She was a bit volume overloaded as she had not been taking her Bumex due to travel.  She was encouraged to resume her previous regimen.  Presents today for follow up independently. Has RSV earlier in the month. Still with some congestion and cough. Used Muccinex and Tessalon Pearles.  Notes she is having TIPS procedure in March. They had to do blood work this week and had difficulty finding a vein so she stopped her diuretics for 9 days and also increased her oral hydration. Her weight subsequently  has increased. Echocardiogram 11/01/22 as part of preop workup with LVEF 60-65%, no RWMA, LA mildly dilated, no significant valvular abnormalities.  Admitted 2/25-2/27/24 with cellulitis and sepsis treated with antibiotics. Her 11/21/22 TIPS was cancelled due to car trouble.  She presents today for hospital follow up. Weight at last clinic visit 1 month ago 357 lbs now 332 lbs (lowest weight on 03/2022 of 324 lbs). Notes morning lightheadedness/dizziness upon waking with BP 80s/50s. Threw up Friday, Saturday, and was very nauseated Sunday. Ongoing for a couple weeks. She has been self adjusting her Bumex. Most often taking once per day. If she's taken her Metolazone within 3 days she will take a second dose of Bumex. Heart rate at home usually in the 90s. Cellulitis resolved and has not recurred.  EKGs/Labs/Other Studies Reviewed:   The following studies were reviewed today:   Echocardiogram 03/20/2021:  1. Technically difficult; normal LV function; enlarged right heart.   2. Left ventricular ejection fraction, by estimation, is 60 to 65%. The  left ventricle has normal function. The left  ventricle has no regional  wall motion abnormalities. There is mild left ventricular hypertrophy.  Left ventricular diastolic parameters  are indeterminate.   3. Right ventricular systolic function is normal. The right ventricular  size is moderately enlarged. There is normal pulmonary artery systolic  pressure.   4. Left atrial size was mildly dilated.   5. Right atrial size was severely dilated.   6. Large pleural effusion.   7. The mitral valve is normal in structure. Trivial mitral valve  regurgitation. No evidence of mitral stenosis.   8. The aortic valve is calcified. Aortic valve regurgitation is not  visualized. No aortic stenosis is present.   9. Aortic dilatation noted. There is borderline dilatation of the  ascending aorta, measuring 38 mm.  10. The inferior vena cava is dilated in size with >50%  respiratory  variability, suggesting right atrial pressure of 8 mmHg.  EKG:  EKG is not ordered today.   Recent Labs: 11/12/2022: B Natriuretic Peptide 434.3 11/15/2022: ALT 19; BUN 19; Creatinine, Ser 0.79; Hemoglobin 11.8; Magnesium 1.8; Platelets 23; Potassium 3.5; Sodium 135  Recent Lipid Panel    Component Value Date/Time   CHOL 167 07/14/2022 0943   TRIG 77 07/14/2022 0943   HDL 84 07/14/2022 0943   CHOLHDL 2.0 07/14/2022 0943   CHOLHDL 3.0 07/02/2014 1138   VLDL 19 07/02/2014 1138   LDLCALC 69 07/14/2022 0943    Home Medications   Current Meds  Medication Sig   albuterol (PROVENTIL) (2.5 MG/3ML) 0.083% nebulizer solution Take 3 mLs (2.5 mg total) by nebulization every 6 (six) hours as needed for wheezing or shortness of breath.   Bacitracin-Polymyxin B (NEOSPORIN EX) Apply 1 Application topically daily as needed (wound Treatment).   bumetanide (BUMEX) 2 MG tablet Take 1 tablet (2 mg total) by mouth 2 (two) times daily. (Patient taking differently: Take 2 mg by mouth daily.)   diclofenac Sodium (VOLTAREN) 1 % GEL Apply topically at bedtime as needed. Right shoulder and right neck   diltiazem (CARDIZEM CD) 180 MG 24 hr capsule TAKE 1 CAPSULE BY MOUTH EVERY DAY (Patient taking differently: Take 180 mg by mouth daily.)   empagliflozin (JARDIANCE) 25 MG TABS tablet Take 25 mg by mouth in the morning.   lactulose (CHRONULAC) 10 GM/15ML solution Take 30 mLs by mouth 3 (three) times daily.   Lancets (ONETOUCH DELICA PLUS 123XX123) MISC Apply topically 3 (three) times daily.   metolazone (ZAROXOLYN) 5 MG tablet TAKE 1 TABLE EVERY 3 DAYS AS NEEDED FOR MORE THAN 4 POUND WEIGHT GAIN. (Patient taking differently: Take 5 mg by mouth every 3 (three) days. Take 1 table every 3 days  as needed for more than 4 pound weight gain.)   metoprolol succinate (TOPROL-XL) 50 MG 24 hr tablet TAKE 3 TABLETS ONCE A DAY Take with or immediately following a meal. (Patient taking differently: Take 150 mg by  mouth daily. TAKE 3 TABLETS ONCE A DAY Take with or immediately following a meal.)   Multiple Vitamins-Minerals (PRESERVISION AREDS 2) CAPS Take 1 capsule by mouth 2 (two) times daily.   ONETOUCH VERIO test strip 1 each 3 (three) times daily.   potassium chloride (MICRO-K) 10 MEQ CR capsule Take 40 mEq by mouth 2 (two) times daily.   Propylene Glycol (SYSTANE BALANCE) 0.6 % SOLN Place 1 drop into both eyes 2 (two) times daily.   Semaglutide, 2 MG/DOSE, (OZEMPIC, 2 MG/DOSE,) 8 MG/3ML SOPN Inject 2 mg into the skin once a week. (Patient taking differently: Inject  2 mg into the skin every Thursday.)   XIFAXAN 550 MG TABS tablet Take 550 mg by mouth 2 (two) times daily.     Review of Systems      All other systems reviewed and are otherwise negative except as noted above.  Physical Exam    VS:  BP 98/62   Pulse 90   Ht 5\' 7"  (1.702 m)   Wt (!) 332 lb (150.6 kg)   BMI 52.00 kg/m  , BMI Body mass index is 52 kg/m.  Wt Readings from Last 3 Encounters:  12/12/22 (!) 332 lb (150.6 kg)  11/12/22 (!) 350 lb 1.6 oz (158.8 kg)  11/03/22 (!) 357 lb (161.9 kg)    GEN: Well nourished, overweight, well developed, in no acute distress. HEENT: normal. Neck: Supple, no JVD, carotid bruits, or masses. Cardiac: IRIR, gr 2/6 systolic murmur, no  rubs or gallops. No clubbing, cyanosis, edema.  Radials/PT 2+ and equal bilaterally.  Respiratory:  Respirations regular and unlabored, clear to auscultation bilaterally. GI: Soft, nontender, nondistended. MS: No deformity or atrophy. Skin: Warm and dry, no rash. Neuro:  Strength and sensation are intact. Psych: Normal affect.  Assessment & Plan    Preop clearance - Upcoming TIPS with Dr. Serafina Royals. Per AHA/ACC guidelines, she is deemed acceptable risk for the planned procedure without additional cardiovascular testing. PT INR today for preop purposes to avoid multiple sticks.   Chronic diastolic heart failure -Low sodium diet, fluid restriction <2L, and  daily weights encouraged. Educated to contact our office for weight gain of 2 lbs overnight or 5 lbs in one week. Continue Bumex BID, Metolazone PRN.   Persistent atrial fibrillation - Previously intolerant to digoxin. No anticoagulation due to high bleed risk and cirrhosis.Not candidate for Watchman. Continue Toprol 150mg  QD and diltiazem 180 daily. Will move Diltiazem to evening dosing due to dizziness, hypotension. Hesitant to to reduce dose due to heart rate in the 90s.  OSA - CPAP compliance encouraged.   Thrombocytopenia - Follows with Dr. Alen Blew of hematology. Update CBC today.   Morbid obesity - Weight loss via diet and exercise encouraged. Discussed the impact being overweight would have on cardiovascular risk. On Ozempic per endocrinology. Previously declined referral to PREP exercise program today.   DM2 - Continue to follow with PCP. Continue Jardiance for cardioprotective and heart failure benefit.   Liver cirrhosis secondary to nonalcoholic steato hepatitis-  Continue to follow with GI and PCP. TIPS upcoming. Per AHA/ACC guidelines, she is deemed acceptable risk for the planned procedure without additional cardiovascular testing. Will route to surgical team so they are aware.  Ammonia level today due to nausea.   Disposition: Follow up in 3 month with Dr. Oval Linsey.  Signed, Loel Dubonnet, NP 12/12/2022, 9:04 AM [place]

## 2022-12-12 NOTE — Patient Instructions (Addendum)
Medication Instructions:  Your physician has recommended you make the following change in your medication:  MOVE Diltiazem to the evening  *do not take this evening, take tomorrow evening  *If you need a refill on your cardiac medications before your next appointment, please call your pharmacy*   Lab Work: Your physician recommends that you return for lab work today: ammonia, PT-INR, CBC, CMP  If you have labs (blood work) drawn today and your tests are completely normal, you will receive your results only by: [place] (if you have MyChart) OR A paper copy in the mail If you have any lab test that is abnormal or we need to change your treatment, we will call you to review the results.  Follow-Up: At [facility], you and your health needs are our priority.  As part of our continuing mission to provide you with exceptional heart care, we have created designated Provider Care Teams.  These Care Teams include your primary Cardiologist (physician) and Advanced Practice Providers (APPs -  Physician Assistants and Nurse Practitioners) who all work together to provide you with the care you need, when you need it.  We recommend signing up for the patient portal called "MyChart".  Sign up information is provided on this After Visit Summary.  MyChart is used to connect with patients for Virtual Visits (Telemedicine).  Patients are able to view lab/test results, encounter notes, upcoming appointments, etc.  Non-urgent messages can be sent to your provider as well.   To learn more about what you can do with MyChart, go to [email or web address].    Your next appointment:   3 month(s)  Provider:   Skeet Latch, MD or [name], NP    Other Instructions  Recommend weighing daily and keeping a log. Please call our office if you have weight gain of 2 pounds overnight or 5 pounds in 1 week.   Date  Time Weight

## 2022-12-13 LAB — PROTIME-INR
INR: 1.2 (ref 0.9–1.2)
Prothrombin Time: 12.6 s — ABNORMAL HIGH (ref 9.1–12.0)

## 2022-12-13 LAB — COMPREHENSIVE METABOLIC PANEL
ALT: 21 IU/L (ref 0–32)
AST: 37 IU/L (ref 0–40)
Albumin/Globulin Ratio: 1.2 (ref 1.2–2.2)
Albumin: 3.5 g/dL — ABNORMAL LOW (ref 3.9–4.9)
Alkaline Phosphatase: 115 IU/L (ref 44–121)
BUN/Creatinine Ratio: 14 (ref 12–28)
BUN: 14 mg/dL (ref 8–27)
Bilirubin Total: 5.2 mg/dL — ABNORMAL HIGH (ref 0.0–1.2)
CO2: 25 mmol/L (ref 20–29)
Calcium: 9.7 mg/dL (ref 8.7–10.3)
Chloride: 98 mmol/L (ref 96–106)
Creatinine, Ser: 1.01 mg/dL — ABNORMAL HIGH (ref 0.57–1.00)
Globulin, Total: 2.9 g/dL (ref 1.5–4.5)
Glucose: 135 mg/dL — ABNORMAL HIGH (ref 70–99)
Potassium: 3.1 mmol/L — ABNORMAL LOW (ref 3.5–5.2)
Sodium: 139 mmol/L (ref 134–144)
Total Protein: 6.4 g/dL (ref 6.0–8.5)
eGFR: 61 mL/min/{1.73_m2} (ref >59)

## 2022-12-13 LAB — CBC
Hematocrit: 42.2 % (ref 34.0–46.6)
Hemoglobin: 14.5 g/dL (ref 11.1–15.9)
MCH: 31.9 pg (ref 26.6–33.0)
MCHC: 34.4 g/dL (ref 31.5–35.7)
MCV: 93 fL (ref 79–97)
RBC: 4.54 x10E6/uL (ref 3.77–5.28)
RDW: 14.7 % (ref 11.7–15.4)
WBC: 5.6 10*3/uL (ref 3.4–10.8)

## 2022-12-13 LAB — AMMONIA: Ammonia: 94 ug/dL (ref 34–178)

## 2022-12-14 ENCOUNTER — Other Ambulatory Visit (HOSPITAL_BASED_OUTPATIENT_CLINIC_OR_DEPARTMENT_OTHER): Payer: Self-pay

## 2022-12-14 DIAGNOSIS — L0390 Cellulitis, unspecified: Secondary | ICD-10-CM | POA: Diagnosis not present

## 2022-12-14 DIAGNOSIS — E876 Hypokalemia: Secondary | ICD-10-CM

## 2022-12-14 DIAGNOSIS — S99922A Unspecified injury of left foot, initial encounter: Secondary | ICD-10-CM | POA: Diagnosis not present

## 2022-12-16 DIAGNOSIS — M170 Bilateral primary osteoarthritis of knee: Secondary | ICD-10-CM | POA: Diagnosis not present

## 2022-12-16 DIAGNOSIS — M797 Fibromyalgia: Secondary | ICD-10-CM | POA: Diagnosis not present

## 2022-12-16 DIAGNOSIS — L03311 Cellulitis of abdominal wall: Secondary | ICD-10-CM | POA: Diagnosis not present

## 2022-12-16 DIAGNOSIS — D649 Anemia, unspecified: Secondary | ICD-10-CM | POA: Diagnosis not present

## 2022-12-16 DIAGNOSIS — I5032 Chronic diastolic (congestive) heart failure: Secondary | ICD-10-CM | POA: Diagnosis not present

## 2022-12-16 DIAGNOSIS — A419 Sepsis, unspecified organism: Secondary | ICD-10-CM | POA: Diagnosis not present

## 2022-12-16 DIAGNOSIS — E119 Type 2 diabetes mellitus without complications: Secondary | ICD-10-CM | POA: Diagnosis not present

## 2022-12-16 DIAGNOSIS — G4733 Obstructive sleep apnea (adult) (pediatric): Secondary | ICD-10-CM | POA: Diagnosis not present

## 2022-12-16 DIAGNOSIS — I4819 Other persistent atrial fibrillation: Secondary | ICD-10-CM | POA: Diagnosis not present

## 2022-12-20 ENCOUNTER — Encounter (HOSPITAL_COMMUNITY): Payer: Self-pay | Admitting: Interventional Radiology

## 2022-12-20 ENCOUNTER — Other Ambulatory Visit: Payer: Self-pay

## 2022-12-20 NOTE — Progress Notes (Signed)
Hannah Khan denies chest pain or shortness of breath. Patient denies having any s/s of Covid in her household, also denies any known exposure to Covid. Hannah Khan denies  ny s/s of upper or lower respiratory in the past 8 weeks.   Hannah Khan PCP is Dr. Cephus Shelling, cardiologist is  Dr.T.Gregory.   Hannah Khan has sleep apnea, I asked patient to bring her mask and tubing.  Hannah Khan has type II diabetes, patient has not taken Ozempic since 12/08/22, last day of Jardiance was today.  I instructed patient to check CBG after awaking and every 2 hours until arrival  to the hospital.  I Instructed patient if CBG is less than 70 to take 4 Glucose Tablets or 1 tube of Glucose Gel or 1/2 cup of a clear juice. Recheck CBG in 15 minutes if CBG is not over 70 call, pre- op desk at [phone] for further instructions.

## 2022-12-21 ENCOUNTER — Other Ambulatory Visit: Payer: Self-pay | Admitting: Radiology

## 2022-12-22 ENCOUNTER — Other Ambulatory Visit: Payer: Self-pay | Admitting: Student

## 2022-12-22 NOTE — Anesthesia Preprocedure Evaluation (Addendum)
Anesthesia Evaluation  Patient identified by MRN, date of birth, ID band Patient awake    Reviewed: Allergy & Precautions, NPO status , Patient's Chart, lab work & pertinent test results  History of Anesthesia Complications (+) PONV and history of anesthetic complications  Airway Mallampati: II  TM Distance: >3 FB Neck ROM: Full   Comment: Previous grade I view with MAC 4, easy mask in 2017; glidescope used in 2022 Dental  (+) Dental Advisory Given   Pulmonary neg shortness of breath, sleep apnea , neg COPD, neg recent URI, former smoker   Pulmonary exam normal breath sounds clear to auscultation       Cardiovascular hypertension (bumetanide, diltiazem, metolazone, metoprolol), Pt. on medications and Pt. on home beta blockers (-) angina +CHF (diastolic)  (-) Past MI, (-) Cardiac Stents and (-) CABG + dysrhythmias Atrial Fibrillation + Valvular Problems/Murmurs  Rhythm:Regular Rate:Normal  HLD  TTE 2/13/024: IMPRESSIONS     1. Left ventricular ejection fraction, by estimation, is 60 to 65%. The  left ventricle has normal function. The left ventricle has no regional  wall motion abnormalities. Left ventricular diastolic function could not  be evaluated.   2. Right ventricular systolic function is normal. The right ventricular  size is normal. There is normal pulmonary artery systolic pressure.   3. Left atrial size was mildly dilated.   4. The mitral valve is normal in structure. Trivial mitral valve  regurgitation. No evidence of mitral stenosis.   5. The aortic valve was not well visualized. Aortic valve regurgitation  is not visualized. No aortic stenosis is present.   6. The inferior vena cava is normal in size with greater than 50%  respiratory variability, suggesting right atrial pressure of 3 mmHg.     Neuro/Psych neg Seizures    GI/Hepatic negative GI ROS,,,(+) Cirrhosis  (secondary to NASH, portal HTN, gastric  varices)        Endo/Other  diabetes, Type 2  Morbid obesity  Renal/GU negative Renal ROS     Musculoskeletal  (+) Arthritis ,  Fibromyalgia -  Abdominal  (+) + obese  Peds  Hematology  (+) Blood dyscrasia (thrombocytopenia), anemia   Anesthesia Other Findings Last Ozempic: 12/08/2022  K 3.1  Admitted 2/25-2/27/24 with cellulitis and sepsis treated with antibiotics  Reproductive/Obstetrics                             Anesthesia Physical Anesthesia Plan  ASA: 4  Anesthesia Plan: General   Post-op Pain Management:    Induction: Intravenous  PONV Risk Score and Plan: 4 or greater and Ondansetron, Dexamethasone, Treatment may vary due to age or medical condition and Midazolam  Airway Management Planned: Oral ETT  Additional Equipment:   Intra-op Plan:   Post-operative Plan: Extubation in OR  Informed Consent: I have reviewed the patients History and Physical, chart, labs and discussed the procedure including the risks, benefits and alternatives for the proposed anesthesia with the patient or authorized representative who has indicated his/her understanding and acceptance.     Dental advisory given  Plan Discussed with: CRNA and Anesthesiologist  Anesthesia Plan Comments: (Risks of general anesthesia discussed including, but not limited to, sore throat, hoarse voice, chipped/damaged teeth, injury to vocal cords, nausea and vomiting, allergic reactions, lung infection, heart attack, stroke, and death. All questions answered. )       Anesthesia Quick Evaluation

## 2022-12-22 NOTE — H&P (Deleted)
  The note originally documented on this encounter has been moved the the encounter in which it belongs.  

## 2022-12-22 NOTE — H&P (Addendum)
Chief Complaint: NASH cirrhosis with gastroesophageal varices  Referring Physician(s): Willis Modena   Supervising Physician: Marliss Coots  Patient Status: [facility] - Out-pt  History of Present Illness: Hannah Khan is a 67 y.o. female with NASH cirrhosis (Child Pugh B, MELD 15), portal hypertension, and gastroesophageal varices without history of hemorrhage.   CT scan showed multiple portosystemic shunts including gastrorenal and an ectopic SMV-->IVC varix, and confirmed suspicion of main portal vein chronic thrombus seen on 2021 CT.    She has very large gastroesophageal varices.    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  She was evaluated for TIPS by Dr. Elby Showers.   She has never experienced any evidence of upper or lower GI hemorrhage but he feels that if it should occur, it would be fatal.     Portal hypertension decompression should significantly decrease her chances of hemorrhage.     She was evaluated by Dr. Elby Showers, per his note, she is here today for  TIPS  creation, possible transsplenic portal vein recanalization, and variceal embolization including coil embolization of the gastrorenal shunt near the confluence with the left renal vein, and the SMV-->IVC varix, in addition to possibly providing sclerotherapy to the gastroesophageal varices.   She is NPO. No nausea/vomiting. No Fever/chills. ROS negative.    Past Medical History:  Diagnosis Date   Anemia    hx of   Arthritis    knees   CHF (congestive heart failure)    chronic diastolic   Diabetes mellitus without complication    type 2   Dyspnea    Dysrhythmia    Fibromyalgia    Gastric varices    large   H/O transfusion of platelets    Heart murmur    never has caused any problems   Hx of colonic polyps    s/p partial colectomy   Hyperlipidemia    Hypertension    Joint pain    in knees and back spasms   Left leg swelling    wear  compression hose   Liver cirrhosis secondary to NASH (nonalcoholic steatohepatitis) dx nov 2014   had enlarged spleen also    Morbid obesity    Persistent atrial fibrillation    Pneumonia 10/2016   x 5   PONV (postoperative nausea and vomiting)    in past none recent   Portal hypertension    Sleep apnea    uses cpap, setting varies between 14-20   Spleen enlarged    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     Past Surgical History:  Procedure Laterality Date   BREAST LUMPECTOMY WITH RADIOACTIVE SEED LOCALIZATION Right 08/29/2017   Procedure: RIGHT BREAST LUMPECTOMY WITH RADIOACTIVE SEEDS LOCALIZATION;  Surgeon: Harriette Bouillon, MD;  Location: [facility] OR;  Service: General;  Laterality: Right;   CESAREAN SECTION  1989   CHOLECYSTECTOMY  20 yrs ago   COLECTOMY  2011   COLONOSCOPY     COLONOSCOPY N/A 09/05/2018   Procedure: COLONOSCOPY;  Surgeon: Willis Modena, MD;  Location: [facility] ENDOSCOPY;  Service: Endoscopy;  Laterality: N/A;   COLONOSCOPY WITH PROPOFOL Bilateral 05/04/2022   Procedure: COLONOSCOPY WITH PROPOFOL;  Surgeon: Willis Modena, MD;  Location: [facility] ENDOSCOPY;  Service: Gastroenterology;  Laterality: Bilateral;   DILATATION & CURETTAGE/HYSTEROSCOPY WITH MYOSURE N/A 05/06/2016   Procedure: DILATATION & CURETTAGE/HYSTEROSCOPY WITH MYOSURE WITH POLYPECTOMY;  Surgeon: Myna Hidalgo, DO;  Location: [facility] ORS;  Service: Gynecology;  Laterality: N/A;   ESOPHAGOGASTRODUODENOSCOPY (EGD) WITH PROPOFOL N/A 09/04/2013  Procedure: ESOPHAGOGASTRODUODENOSCOPY (EGD) WITH PROPOFOL;  Surgeon: Willis Modena, MD;  Location: [facility];  Service: Endoscopy;  Laterality: N/A;   ESOPHAGOGASTRODUODENOSCOPY (EGD) WITH PROPOFOL Bilateral 05/04/2022   Procedure: ESOPHAGOGASTRODUODENOSCOPY (EGD) WITH PROPOFOL;  Surgeon: Willis Modena, MD;  Location: [facility];  Service: Gastroenterology;  Laterality: Bilateral;   IR RADIOLOGIST EVAL & MGMT  09/21/2022   IR RADIOLOGIST EVAL & MGMT  10/06/2022   IR  RADIOLOGIST EVAL & MGMT  11/02/2022   KNEE ARTHROSCOPY  yrs ago   bilateral, one done x 1, one done twice   POLYPECTOMY  09/05/2018   Procedure: POLYPECTOMY;  Surgeon: Willis Modena, MD;  Location: [facility] END[facility];  Service: Endoscopy;;    Allergies: Celecoxib, Shellfish allergy, Barium-containing compounds, Prednisone, Statins, Fentanyl, Ibuprofen, Tramadol, Tylenol [acetaminophen], and Zetia [ezetimibe]  Medications: Prior to Admission medications   Medication Sig Start Date End Date Taking? Authorizing Provider  albuterol (PROVENTIL) (2.5 MG/3ML) 0.083% nebulizer solution Take 3 mLs (2.5 mg total) by nebulization every 6 (six) hours as needed for wheezing or shortness of breath. 03/22/21   Noralee Stain, DO  albuterol (VENTOLIN HFA) 108 (90 Base) MCG/ACT inhaler Inhale 1-2 puffs into the lungs every 6 (six) hours as needed for wheezing or shortness of breath.    [provider]  Bacitracin-Polymyxin B (NEOSPORIN EX) Apply 1 Application topically daily as needed (wound Treatment).    [provider]  bumetanide (BUMEX) 2 MG tablet Take 1 tablet (2 mg total) by mouth 2 (two) times daily. Patient taking differently: Take 2 mg by mouth See admin instructions. Take 2 mg daily, may take a second 2 mg dose as needed for weight gain 01/12/22   Chilton Si, MD  diclofenac Sodium (VOLTAREN) 1 % GEL Apply 1 Application topically at bedtime as needed (pain).    [provider]  diltiazem (CARDIZEM CD) 180 MG 24 hr capsule TAKE 1 CAPSULE BY MOUTH EVERY DAY Patient taking differently: Take 180 mg by mouth every evening. 10/20/22   Alver Sorrow, NP  empagliflozin (JARDIANCE) 25 MG TABS tablet Take 25 mg by mouth in the morning.    [provider]  lactulose (CHRONULAC) 10 GM/15ML solution Take 10 g by mouth 3 (three) times daily. 03/17/21   [provider]  Lancets (ONETOUCH DELICA PLUS [place]) MISC Apply topically 3 (three) times daily. 04/10/20    [provider]  metolazone (ZAROXOLYN) 5 MG tablet TAKE 1 TABLE EVERY 3 DAYS AS NEEDED FOR MORE THAN 4 POUND WEIGHT GAIN. Patient taking differently: Take 5 mg by mouth See admin instructions. Take 1 table every 3 days  as needed for more than 4 pound weight gain. 07/11/22   Chilton Si, MD  metoprolol succinate (TOPROL-XL) 50 MG 24 hr tablet TAKE 3 TABLETS ONCE A DAY Take with or immediately following a meal. 07/14/22   Chilton Si, MD  Multiple Vitamins-Minerals (PRESERVISION AREDS 2) CAPS Take 1 capsule by mouth 2 (two) times daily.    [provider]  [facility] VERIO test strip 1 each 3 (three) times daily. 04/13/20   [provider]  potassium chloride (MICRO-K) 10 MEQ CR capsule Take 40 mEq by mouth 2 (two) times daily.    [provider]  Propylene Glycol (SYSTANE BALANCE) 0.6 % SOLN Place 1 drop into both eyes 2 (two) times daily.    [provider]  Semaglutide, 2 MG/DOSE, (OZEMPIC, 2 MG/DOSE,) 8 MG/3ML SOPN Inject 2 mg into the skin once a week. Patient taking differently: Inject 2 mg  into the skin every Thursday. 11/03/22   Alver Sorrow, NP  sulfamethoxazole-trimethoprim (BACTRIM DS) 800-160 MG tablet Take 1 tablet by mouth 2 (two) times daily.    [provider]  XIFAXAN 550 MG TABS tablet Take 550 mg by mouth 2 (two) times daily. 03/13/21   [provider]     Family History  Problem Relation Age of Onset   Heart failure Mother    Cancer Mother    Hyperlipidemia Mother    Congestive Heart Failure Mother    Stroke Mother    Lung cancer Father    Cancer Father    Cancer Brother    Hyperlipidemia Brother    Stroke Other     Social History   Socioeconomic History   Marital status: Married    Spouse name: Not on file   Number of children: Not on file   Years of education: Not on file   Highest education level: Not on file  Occupational History   Occupation: Scientist, product/process development  Tobacco Use    Smoking status: Former    Packs/day: 1.00    Years: 3.00    Additional pack years: 0.00    Total pack years: 3.00    Types: Cigarettes    Quit date: 09/19/1974    Years since quitting: 48.2   Smokeless tobacco: Never  Vaping Use   Vaping Use: Never used  Substance and Sexual Activity   Alcohol use: Not Currently    Comment: occ   Drug use: No   Sexual activity: Not on file  Other Topics Concern   Not on file  Social History Narrative   Lives in [place] with her husband.  Instructor for early childhood development.     Social Determinants of Health   Financial Resource Strain: Not on file  Food Insecurity: No Food Insecurity (11/13/2022)   Hunger Vital Sign    Worried About Running Out of Food in the Last Year: Never true    Ran Out of Food in the Last Year: Never true  Transportation Needs: No Transportation Needs (11/13/2022)   PRAPARE - Administrator, Civil Service (Medical): No    Lack of Transportation (Non-Medical): No  Physical Activity: Not on file  Stress: Not on file  Social Connections: Not on file     Review of Systems: A 12 point ROS discussed and pertinent positives are indicated in the HPI above.  All other systems are negative.  Review of Systems  Vital Signs: BP (!) 107/56 (BP Location: Right Arm)   Pulse (!) 103   Temp 97.7 F (36.5 C) (Oral)   Resp 20   Ht 5\' 7"  (1.702 m)   Wt (!) 329 lb (149.2 kg)   SpO2 95%   BMI 51.53 kg/m   Physical Exam Vitals reviewed.  Constitutional:      Appearance: Normal appearance.  HENT:     Head: Normocephalic and atraumatic.  Eyes:     Extraocular Movements: Extraocular movements intact.  Cardiovascular:     Rate and Rhythm: Normal rate and regular rhythm.     Heart sounds: Murmur heard.  Pulmonary:     Effort: Pulmonary effort is normal. No respiratory distress.     Breath sounds: Normal breath sounds.  Abdominal:     Palpations: Abdomen is soft.  Musculoskeletal:        General:  Normal range of motion.     Cervical back: Normal range of motion.  Skin:  General: Skin is warm and dry.  Neurological:     General: No focal deficit present.     Mental Status: She is alert and oriented to person, place, and time.  Psychiatric:        Mood and Affect: Mood normal.        Behavior: Behavior normal.        Thought Content: Thought content normal.        Judgment: Judgment normal.     Imaging: No results found.  Labs:  CBC: Recent Labs    11/14/22 1540 11/15/22 0156 12/12/22 0942 12/23/22 0735  WBC 4.4 3.6* 5.6 4.6  HGB 12.8 11.8* 14.5 13.0  HCT 38.4 34.1* 42.2 40.5  PLT 26* 23* Comment 44*    COAGS: Recent Labs    11/15/22 0156 12/12/22 0942 12/23/22 0735  INR 1.4* 1.2 1.2    BMP: Recent Labs    11/12/22 1431 11/13/22 0147 11/14/22 0139 11/15/22 0156 12/12/22 0942  NA 136 137 137 135 139  K 4.1 3.8 3.1* 3.5 3.1*  CL 103 106 107 102 98  CO2 24 23 23 26 25   GLUCOSE 137* 137* 126* 126* 135*  BUN 17 19 18 19 14   CALCIUM 8.6* 8.7* 8.3* 8.3* 9.7  CREATININE 0.94 0.93 0.87 0.79 1.01*  GFRNONAA >60 >60 >60 >60  --     LIVER FUNCTION TESTS: Recent Labs    07/14/22 0943 11/14/22 0139 11/15/22 0156 12/12/22 0942  BILITOT 4.3* 3.9* 3.9* 5.2*  AST 35 32 33 37  ALT 23 18 19 21   ALKPHOS 112 61 62 115  PROT 6.4 5.4* 5.5* 6.4  ALBUMIN 3.6* 2.5* 2.4* 3.5*    TUMOR MARKERS: No results for input(s): "AFPTM", "CEA", "CA199", "CHROMGRNA" in the last 8760 hours.  Assessment and Plan:  NASH cirrhosis with gastroesophageal varices.  Will proceed with TIPS creation, possible transsplenic portal vein recanalization, and variceal embolization including coil embolization of the gastrorenal shunt near the confluence with the left renal vein, and the SMV-->IVC varix, in addition to possibly providing sclerotherapy to the gastroesophageal varices.today by Dr. Elby ShowersSuttle.  Risks and benefits of TIPS, BRTO and/or additional variceal embolization were  discussed with the patient and/or the patient's family including, but not limited to, infection, bleeding, damage to adjacent structures, worsening hepatic and/or cardiac function, worsening and/or the development of altered mental status/encephalopathy, non-target embolization and death.   This interventional procedure involves the use of X-rays and because of the nature of the planned procedure, it is possible that we will have prolonged use of X-ray fluoroscopy.  Potential radiation risks to you include (but are not limited to) the following: - A slightly elevated risk for cancer  several years later in life. This risk is typically less than 0.5% percent. This risk is low in comparison to the normal incidence of human cancer, which is 33% for women and 50% for men according to the American Cancer Society. - Radiation induced injury can include skin redness, resembling a rash, tissue breakdown / ulcers and hair loss (which can be temporary or permanent).   The likelihood of either of these occurring depends on the difficulty of the procedure and whether you are sensitive to radiation due to previous procedures, disease, or genetic conditions.   IF your procedure requires a prolonged use of radiation, you will be notified and given written instructions for further action.  It is your responsibility to monitor the irradiated area for the 2 weeks following the procedure and to  notify your physician if you are concerned that you have suffered a radiation induced injury.    All of the patient's questions were answered, patient is agreeable to proceed.  Consent signed and in chart.  She will be admitted overnight for observation and hopefully discharge tomorrow.  Thank you for allowing our service to participate in Hannah Khan 's care.  Electronically Signed: Gwynneth MacleodWENDY S Avriel Kandel, PA-C   12/23/2022, 8:39 AM      I spent a total of    25 Minutes in face to face in clinical consultation, greater  than 50% of which was counseling/coordinating care for TIPS, embolization of GE varices.

## 2022-12-23 ENCOUNTER — Encounter (HOSPITAL_COMMUNITY): Payer: Self-pay | Admitting: Interventional Radiology

## 2022-12-23 ENCOUNTER — Encounter (HOSPITAL_COMMUNITY)
Admission: RE | Disposition: A | Discharge status: Home / Self Care | Payer: Self-pay | Source: Home / Self Care | Attending: Interventional Radiology

## 2022-12-23 ENCOUNTER — Inpatient Hospital Stay (HOSPITAL_COMMUNITY): Payer: Medicare PPO | Admitting: Certified Registered Nurse Anesthetist

## 2022-12-23 ENCOUNTER — Inpatient Hospital Stay (HOSPITAL_COMMUNITY)
Admission: RE | Admit: 2022-12-23 | Discharge: 2022-12-23 | DRG: 442 | Disposition: A | Discharge status: Home / Self Care | Payer: Medicare PPO | Attending: Interventional Radiology | Admitting: Interventional Radiology

## 2022-12-23 ENCOUNTER — Other Ambulatory Visit: Payer: Self-pay

## 2022-12-23 ENCOUNTER — Inpatient Hospital Stay (HOSPITAL_COMMUNITY)
Admission: RE | Admit: 2022-12-23 | Discharge: 2022-12-23 | Disposition: A | Discharge status: Home / Self Care | Payer: Medicare PPO | Source: Ambulatory Visit | Attending: Interventional Radiology | Admitting: Interventional Radiology

## 2022-12-23 DIAGNOSIS — I8510 Secondary esophageal varices without bleeding: Secondary | ICD-10-CM | POA: Diagnosis present

## 2022-12-23 DIAGNOSIS — Z8249 Family history of ischemic heart disease and other diseases of the circulatory system: Secondary | ICD-10-CM

## 2022-12-23 DIAGNOSIS — I864 Gastric varices: Secondary | ICD-10-CM | POA: Diagnosis present

## 2022-12-23 DIAGNOSIS — M170 Bilateral primary osteoarthritis of knee: Secondary | ICD-10-CM | POA: Diagnosis present

## 2022-12-23 DIAGNOSIS — Z539 Procedure and treatment not carried out, unspecified reason: Secondary | ICD-10-CM | POA: Diagnosis present

## 2022-12-23 DIAGNOSIS — I110 Hypertensive heart disease with heart failure: Secondary | ICD-10-CM | POA: Diagnosis present

## 2022-12-23 DIAGNOSIS — K7460 Unspecified cirrhosis of liver: Secondary | ICD-10-CM | POA: Diagnosis present

## 2022-12-23 DIAGNOSIS — Z87891 Personal history of nicotine dependence: Secondary | ICD-10-CM

## 2022-12-23 DIAGNOSIS — E785 Hyperlipidemia, unspecified: Secondary | ICD-10-CM | POA: Diagnosis present

## 2022-12-23 DIAGNOSIS — M797 Fibromyalgia: Secondary | ICD-10-CM | POA: Diagnosis present

## 2022-12-23 DIAGNOSIS — R188 Other ascites: Secondary | ICD-10-CM | POA: Diagnosis not present

## 2022-12-23 DIAGNOSIS — Z888 Allergy status to other drugs, medicaments and biological substances status: Secondary | ICD-10-CM

## 2022-12-23 DIAGNOSIS — Z7984 Long term (current) use of oral hypoglycemic drugs: Secondary | ICD-10-CM

## 2022-12-23 DIAGNOSIS — I4819 Other persistent atrial fibrillation: Secondary | ICD-10-CM | POA: Diagnosis present

## 2022-12-23 DIAGNOSIS — K766 Portal hypertension: Secondary | ICD-10-CM | POA: Diagnosis present

## 2022-12-23 DIAGNOSIS — K7581 Nonalcoholic steatohepatitis (NASH): Secondary | ICD-10-CM | POA: Diagnosis not present

## 2022-12-23 DIAGNOSIS — Z83438 Family history of other disorder of lipoprotein metabolism and other lipidemia: Secondary | ICD-10-CM

## 2022-12-23 DIAGNOSIS — I8501 Esophageal varices with bleeding: Principal | ICD-10-CM

## 2022-12-23 DIAGNOSIS — E119 Type 2 diabetes mellitus without complications: Secondary | ICD-10-CM | POA: Diagnosis present

## 2022-12-23 DIAGNOSIS — Z86010 Personal history of colonic polyps: Secondary | ICD-10-CM

## 2022-12-23 DIAGNOSIS — Z801 Family history of malignant neoplasm of trachea, bronchus and lung: Secondary | ICD-10-CM

## 2022-12-23 DIAGNOSIS — Z823 Family history of stroke: Secondary | ICD-10-CM

## 2022-12-23 DIAGNOSIS — I5030 Unspecified diastolic (congestive) heart failure: Secondary | ICD-10-CM | POA: Diagnosis not present

## 2022-12-23 DIAGNOSIS — Z91013 Allergy to seafood: Secondary | ICD-10-CM | POA: Diagnosis not present

## 2022-12-23 DIAGNOSIS — Z885 Allergy status to narcotic agent status: Secondary | ICD-10-CM | POA: Diagnosis not present

## 2022-12-23 DIAGNOSIS — Z79899 Other long term (current) drug therapy: Secondary | ICD-10-CM

## 2022-12-23 DIAGNOSIS — Z5309 Procedure and treatment not carried out because of other contraindication: Secondary | ICD-10-CM | POA: Diagnosis not present

## 2022-12-23 DIAGNOSIS — G4730 Sleep apnea, unspecified: Secondary | ICD-10-CM | POA: Diagnosis present

## 2022-12-23 DIAGNOSIS — I5032 Chronic diastolic (congestive) heart failure: Secondary | ICD-10-CM | POA: Diagnosis not present

## 2022-12-23 DIAGNOSIS — I4891 Unspecified atrial fibrillation: Secondary | ICD-10-CM | POA: Diagnosis not present

## 2022-12-23 DIAGNOSIS — I8500 Esophageal varices without bleeding: Secondary | ICD-10-CM | POA: Diagnosis not present

## 2022-12-23 DIAGNOSIS — Z9049 Acquired absence of other specified parts of digestive tract: Secondary | ICD-10-CM

## 2022-12-23 DIAGNOSIS — Z6841 Body Mass Index (BMI) 40.0 and over, adult: Secondary | ICD-10-CM | POA: Diagnosis not present

## 2022-12-23 DIAGNOSIS — Z7985 Long-term (current) use of injectable non-insulin antidiabetic drugs: Secondary | ICD-10-CM

## 2022-12-23 HISTORY — DX: Gastric varices: I86.4

## 2022-12-23 HISTORY — PX: IR INTRAVASCULAR ULTRASOUND NON CORONARY: IMG6085

## 2022-12-23 HISTORY — PX: RADIOLOGY WITH ANESTHESIA: SHX6223

## 2022-12-23 HISTORY — PX: IR US GUIDE VASC ACCESS RIGHT: IMG2390

## 2022-12-23 HISTORY — PX: IR TIPS: IMG2295

## 2022-12-23 HISTORY — DX: Heart failure, unspecified: I50.9

## 2022-12-23 LAB — POCT I-STAT, CHEM 8
BUN: 14 mg/dL (ref 8–23)
Calcium, Ion: 1.15 mmol/L (ref 1.15–1.40)
Chloride: 106 mmol/L (ref 98–111)
Creatinine, Ser: 0.8 mg/dL (ref 0.44–1.00)
Glucose, Bld: 107 mg/dL — ABNORMAL HIGH (ref 70–99)
HCT: 34 % — ABNORMAL LOW (ref 36.0–46.0)
Hemoglobin: 11.6 g/dL — ABNORMAL LOW (ref 12.0–15.0)
Potassium: 3.3 mmol/L — ABNORMAL LOW (ref 3.5–5.1)
Sodium: 142 mmol/L (ref 135–145)
TCO2: 22 mmol/L (ref 22–32)

## 2022-12-23 LAB — BPAM RBC
Blood Product Expiration Date: 202405072359
Unit Type and Rh: 5100
Unit Type and Rh: 5100

## 2022-12-23 LAB — TYPE AND SCREEN
ABO/RH(D): O POS
Unit division: 0
Unit division: 0
Unit division: 0
Unit division: 0

## 2022-12-23 LAB — PROTIME-INR
INR: 1.2 (ref 0.8–1.2)
Prothrombin Time: 15.3 seconds — ABNORMAL HIGH (ref 11.4–15.2)

## 2022-12-23 LAB — GLUCOSE, CAPILLARY
Glucose-Capillary: 123 mg/dL — ABNORMAL HIGH (ref 70–99)
Glucose-Capillary: 95 mg/dL (ref 70–99)

## 2022-12-23 LAB — CBC
HCT: 40.5 % (ref 36.0–46.0)
Hemoglobin: 13 g/dL (ref 12.0–15.0)
MCH: 32.1 pg (ref 26.0–34.0)
MCHC: 32.1 g/dL (ref 30.0–36.0)
MCV: 100 fL (ref 80.0–100.0)
Platelets: 44 10*3/uL — ABNORMAL LOW (ref 150–400)
RBC: 4.05 MIL/uL (ref 3.87–5.11)
RDW: 16.3 % — ABNORMAL HIGH (ref 11.5–15.5)
WBC: 4.6 10*3/uL (ref 4.0–10.5)
nRBC: 0 % (ref 0.0–0.2)

## 2022-12-23 LAB — COMPREHENSIVE METABOLIC PANEL
ALT: 20 U/L (ref 0–44)
AST: 30 U/L (ref 15–41)
Albumin: 2.7 g/dL — ABNORMAL LOW (ref 3.5–5.0)
Alkaline Phosphatase: 77 U/L (ref 38–126)
Anion gap: 11 (ref 5–15)
BUN: 14 mg/dL (ref 8–23)
CO2: 22 mmol/L (ref 22–32)
Calcium: 8.4 mg/dL — ABNORMAL LOW (ref 8.9–10.3)
Chloride: 104 mmol/L (ref 98–111)
Creatinine, Ser: 0.85 mg/dL (ref 0.44–1.00)
GFR, Estimated: >60 mL/min (ref >60)
Glucose, Bld: 116 mg/dL — ABNORMAL HIGH (ref 70–99)
Potassium: 3.4 mmol/L — ABNORMAL LOW (ref 3.5–5.1)
Sodium: 137 mmol/L (ref 135–145)
Total Bilirubin: 2.9 mg/dL — ABNORMAL HIGH (ref 0.3–1.2)
Total Protein: 6.1 g/dL — ABNORMAL LOW (ref 6.5–8.1)

## 2022-12-23 LAB — PREPARE RBC (CROSSMATCH)

## 2022-12-23 SURGERY — IR WITH ANESTHESIA
Anesthesia: General

## 2022-12-23 MED ORDER — ALBUMIN HUMAN 5 % IV SOLN: INTRAVENOUS | Status: DC | PRN

## 2022-12-23 MED ORDER — LIDOCAINE 2% (20 MG/ML) 5 ML SYRINGE
INTRAMUSCULAR | Status: DC | PRN
  Administered 2022-12-23: 100 mg via INTRAVENOUS

## 2022-12-23 MED ORDER — INSULIN ASPART 100 UNIT/ML IJ SOLN: 0.0000 [IU] | INTRAMUSCULAR | Status: DC | PRN

## 2022-12-23 MED ORDER — ORAL CARE MOUTH RINSE: 15.0000 mL | Freq: Once | OROMUCOSAL | Status: AC

## 2022-12-23 MED ORDER — CHLORHEXIDINE GLUCONATE 0.12 % MT SOLN
15.0000 mL | Freq: Once | OROMUCOSAL | Status: AC
  Administered 2022-12-23: 15 mL via OROMUCOSAL
  Filled 2022-12-23: qty 15

## 2022-12-23 MED ORDER — ONDANSETRON HCL 4 MG/2ML IJ SOLN
INTRAMUSCULAR | Status: DC | PRN
  Administered 2022-12-23: 4 mg via INTRAVENOUS

## 2022-12-23 MED ORDER — DEXAMETHASONE SODIUM PHOSPHATE 10 MG/ML IJ SOLN
INTRAMUSCULAR | Status: DC | PRN
  Administered 2022-12-23: 5 mg via INTRAVENOUS

## 2022-12-23 MED ORDER — OXYCODONE HCL 5 MG PO TABS: 5.0000 mg | ORAL_TABLET | Freq: Once | ORAL | Status: DC | PRN

## 2022-12-23 MED ORDER — AMISULPRIDE (ANTIEMETIC) 5 MG/2ML IV SOLN: 10.0000 mg | Freq: Once | INTRAVENOUS | Status: DC | PRN

## 2022-12-23 MED ORDER — GELATIN ABSORBABLE 12-7 MM EX MISC
CUTANEOUS | Status: AC
  Filled 2022-12-23: qty 1

## 2022-12-23 MED ORDER — SODIUM CHLORIDE 0.9 % IV SOLN: INTRAVENOUS | Status: DC | PRN

## 2022-12-23 MED ORDER — SUGAMMADEX SODIUM 200 MG/2ML IV SOLN
INTRAVENOUS | Status: DC | PRN
  Administered 2022-12-23: 400 mg via INTRAVENOUS

## 2022-12-23 MED ORDER — PROPOFOL 10 MG/ML IV BOLUS
INTRAVENOUS | Status: DC | PRN
  Administered 2022-12-23: 150 mg via INTRAVENOUS

## 2022-12-23 MED ORDER — SODIUM CHLORIDE 0.9% IV SOLUTION: Freq: Once | INTRAVENOUS | Status: DC

## 2022-12-23 MED ORDER — MIDAZOLAM HCL 2 MG/2ML IJ SOLN
INTRAMUSCULAR | Status: DC | PRN
  Administered 2022-12-23: 2 mg via INTRAVENOUS

## 2022-12-23 MED ORDER — SODIUM CHLORIDE 0.9 % IV SOLN
2.0000 g | INTRAVENOUS | Status: AC
  Administered 2022-12-23: 2 g via INTRAVENOUS
  Filled 2022-12-23: qty 20

## 2022-12-23 MED ORDER — OXYCODONE HCL 5 MG/5ML PO SOLN: 5.0000 mg | Freq: Once | ORAL | Status: DC | PRN

## 2022-12-23 MED ORDER — FENTANYL CITRATE (PF) 100 MCG/2ML IJ SOLN
INTRAMUSCULAR | Status: AC
  Filled 2022-12-23: qty 2

## 2022-12-23 MED ORDER — PHENYLEPHRINE HCL-NACL 20-0.9 MG/250ML-% IV SOLN
INTRAVENOUS | Status: DC | PRN
  Administered 2022-12-23: 50 ug/min via INTRAVENOUS

## 2022-12-23 MED ORDER — PHENYLEPHRINE HCL (PRESSORS) 10 MG/ML IV SOLN
INTRAVENOUS | Status: DC | PRN
  Administered 2022-12-23 (×2): 160 ug via INTRAVENOUS

## 2022-12-23 MED ORDER — MIDAZOLAM HCL 2 MG/2ML IJ SOLN
INTRAMUSCULAR | Status: AC
  Filled 2022-12-23: qty 2

## 2022-12-23 MED ORDER — LACTATED RINGERS IV SOLN: INTRAVENOUS | Status: DC

## 2022-12-23 MED ORDER — SODIUM CHLORIDE 0.9 % IV SOLN: INTRAVENOUS | Status: DC

## 2022-12-23 MED ORDER — ROCURONIUM BROMIDE 10 MG/ML (PF) SYRINGE
PREFILLED_SYRINGE | INTRAVENOUS | Status: DC | PRN
  Administered 2022-12-23: 40 mg via INTRAVENOUS
  Administered 2022-12-23: 50 mg via INTRAVENOUS

## 2022-12-23 MED ORDER — HYDROMORPHONE HCL 1 MG/ML IJ SOLN: 0.2500 mg | INTRAMUSCULAR | Status: DC | PRN

## 2022-12-23 MED ORDER — FENTANYL CITRATE (PF) 250 MCG/5ML IJ SOLN
INTRAMUSCULAR | Status: DC | PRN
  Administered 2022-12-23: 100 ug via INTRAVENOUS

## 2022-12-23 NOTE — Anesthesia Postprocedure Evaluation (Signed)
Anesthesia Post Note  Patient: Hannah Khan  Procedure(s) Performed: TIPS     Patient location during evaluation: PACU Anesthesia Type: General Level of consciousness: awake Pain management: pain level controlled Vital Signs Assessment: post-procedure vital signs reviewed and stable Respiratory status: spontaneous breathing, nonlabored ventilation and respiratory function stable Cardiovascular status: blood pressure returned to baseline and stable Postop Assessment: no apparent nausea or vomiting Anesthetic complications: no   No notable events documented.  Last Vitals:  Vitals:   12/23/22 1130 12/23/22 1145  BP: 122/73 113/65  Pulse: (!) 101 (!) 104  Resp: 15 14  Temp:  36.6 C  SpO2: 94% 93%    Last Pain:  Vitals:   12/23/22 1058  PainSc: 0-No pain                 Linton Rump

## 2022-12-23 NOTE — Sedation Documentation (Addendum)
Patient transported to PACU with CRNA Sherrilyn Rist. PACU staff at the bedside to receive patient handoff. Right neck and right groin sites intact. No drainage from sites, venous access only.

## 2022-12-23 NOTE — Anesthesia Procedure Notes (Signed)
Procedure Name: Intubation Date/Time: 12/23/2022 9:28 AM  Performed by: Epifanio Lesches, CRNAPre-anesthesia Checklist: Patient identified, Emergency Drugs available, Suction available, Timeout performed and Patient being monitored Patient Re-evaluated:Patient Re-evaluated prior to induction Oxygen Delivery Method: Circle system utilized Preoxygenation: Pre-oxygenation with 100% oxygen Induction Type: IV induction Ventilation: Mask ventilation without difficulty Laryngoscope Size: Mac and 4 Grade View: Grade I Tube type: Oral Tube size: 7.5 mm Number of attempts: 1 Airway Equipment and Method: Stylet Placement Confirmation: ETT inserted through vocal cords under direct vision, positive ETCO2, CO2 detector and breath sounds checked- equal and bilateral Secured at: 21 cm Tube secured with: Tape Dental Injury: Teeth and Oropharynx as per pre-operative assessment

## 2022-12-23 NOTE — Sedation Documentation (Signed)
Pre RA pressure mean 22. Per Dr. Elby Showers, procedure to be aborted at this time due to higher pressures since recent echo in February showing RA pressure of 3

## 2022-12-23 NOTE — Transfer of Care (Signed)
Immediate Anesthesia Transfer of Care Note  Patient: Hannah Khan  Procedure(s) Performed: TIPS  Patient Location: PACU  Anesthesia Type:General  Level of Consciousness: awake, alert , and oriented  Airway & Oxygen Therapy: Patient Spontanous Breathing and Patient connected to nasal cannula oxygen  Post-op Assessment: Report given to RN and Post -op Vital signs reviewed and stable  Post vital signs: Reviewed and stable  Last Vitals:  Vitals Value Taken Time  BP 110/65 12/23/22 1100  Temp    Pulse 107 12/23/22 1104  Resp 15 12/23/22 1104  SpO2 94 % 12/23/22 1104  Vitals shown include unvalidated device data.  Last Pain:  Vitals:   12/23/22 0703  PainSc: 4       Patients Stated Pain Goal: 1 (12/23/22 0703)  Complications: No notable events documented.

## 2022-12-23 NOTE — Procedures (Signed)
Interventional Radiology Procedure Note  Procedure:  1) Intravascular ultrasound 2) Central manometry  Findings: Please refer to procedural dictation for full description.  Procedure aborted due to elevated right heart pressures (mean 23 mmHg).    Right IJ 10 Fr access, right greater saphenous vein 8 Fr access.  Complications: None immediate  Estimated Blood Loss: < 5 mL  Recommendations: Plan to discharge home today. IR will follow up in clinic. I will notify GI and Cardiology of findings today.   Marliss Coots, MD Pager: [phone]

## 2022-12-25 ENCOUNTER — Other Ambulatory Visit: Payer: Self-pay | Admitting: Student

## 2022-12-25 ENCOUNTER — Encounter (HOSPITAL_COMMUNITY): Payer: Self-pay | Admitting: Interventional Radiology

## 2022-12-25 LAB — TYPE AND SCREEN
Antibody Screen: POSITIVE
PT AG Type: NEGATIVE
Unit division: 0
Unit division: 0

## 2022-12-25 LAB — BPAM RBC
Blood Product Expiration Date: 202405052359
Blood Product Expiration Date: 202405052359
Blood Product Expiration Date: 202405072359
Blood Product Expiration Date: 202405072359
Blood Product Expiration Date: 202405072359
Unit Type and Rh: 5100
Unit Type and Rh: 5100
Unit Type and Rh: 5100
Unit Type and Rh: 5100

## 2022-12-25 NOTE — Discharge Summary (Signed)
Patient ID: ROMEY SOBIERAJ MRN: [id number] DOB/AGE: [birth year] 67 y.o.  Admit date: 12/23/2022 Discharge date: 12/25/2022  Supervising Physician: Marliss Coots  Patient Status: [facility] - In-pt  Admission Diagnoses: NASH cirrhosis, recurrent ascites, esophageal varices  Discharge Diagnoses:  NASH cirrhosis, recurrent ascites, esophageal varices  Discharged Condition: good  Hospital Course:  Hannah Khan is a 66 y.o. female with NASH cirrhosis (Child Pugh B, MELD 15), portal hypertension, and gastroesophageal varices without history of hemorrhage.  CT scan showed multiple portosystemic shunts including gastrorenal and an ectopic SMV-->IVC varix, and confirmed suspicion of main portal vein chronic thrombus seen on 2021 CT.  She has very large gastroesophageal varices with increased risk of fatality if hemorrhage were to occur.  She was evaluated for TIPS procedure by Dr. Elby Showers 09/21/22.  After completing work-up she elected to proceed with intervention. She presented to [facility] for procedure today with plans to admit for observation overnight. Unfortunately, during her procedure she was noted to have elevated right heart pressures.  Procedure was aborted and patient was recovered in PACU post anesthesia.  She has been able to eat and drink in PACU with tolerance.  She has no unexpected pain or discomfort.  Her vital signs remain stable.  She is stable for d/c home today.  Plan for follow-up telephone consultation with Dr. Elby Showers in 1 month. Schedulers will contact her to arrange.     Discharge Exam: Blood pressure 113/65, pulse (!) 104, temperature 97.8 F (36.6 C), resp. rate 14, height 5\' 7"  (1.702 m), SpO2 93 %. General appearance: alert, cooperative, and no distress Resp: clear to auscultation bilaterally Cardio: regular rate and rhythm, S1, S2 normal, no murmur, click, rub or gallop GI: soft, non-tender; bowel sounds normal; no masses,  no organomegaly Skin: Skin color, texture,  turgor normal. No rashes or lesions  Disposition: Discharge disposition: 01-Home or Self Care        Allergies as of 12/23/2022       Reactions   Celecoxib Other (See Comments)   "speech slurred" with celebrex   Shellfish Allergy Swelling, Rash   TINGLING AND SWELLING OF LIPS. LARGE WHELPS QUARTER-SIZE   Barium-containing Compounds Other (See Comments)   TACHYCARDIA   Prednisone Other (See Comments)   TACHYCARDIA   Statins Other (See Comments)   AGGRAVATED FIBROMYALGIA   Fentanyl    Hallucinations    Ibuprofen Other (See Comments)   UNSPECIFIED SPECIFIC REACTION >> "DUE TO PLATELETS"   Tramadol    Hallucination   Tylenol [acetaminophen] Other (See Comments)   UNSPECIFIED SPECIFIC REACTION >> "DUE TO PLATELETS"   Zetia [ezetimibe]    Headache        Medication List     STOP taking these medications    sulfamethoxazole-trimethoprim 800-160 MG tablet Commonly known as: BACTRIM DS       TAKE these medications    albuterol 108 (90 Base) MCG/ACT inhaler Commonly known as: VENTOLIN HFA Inhale 1-2 puffs into the lungs every 6 (six) hours as needed for wheezing or shortness of breath.   albuterol (2.5 MG/3ML) 0.083% nebulizer solution Commonly known as: PROVENTIL Take 3 mLs (2.5 mg total) by nebulization every 6 (six) hours as needed for wheezing or shortness of breath.   bumetanide 2 MG tablet Commonly known as: BUMEX Take 1 tablet (2 mg total) by mouth 2 (two) times daily. What changed:  when to take this additional instructions   diclofenac Sodium 1 % Gel Commonly known as: VOLTAREN Apply 1  Application topically at bedtime as needed (pain).   diltiazem 180 MG 24 hr capsule Commonly known as: CARDIZEM CD TAKE 1 CAPSULE BY MOUTH EVERY DAY What changed:  how much to take when to take this   empagliflozin 25 MG Tabs tablet Commonly known as: JARDIANCE Take 25 mg by mouth in the morning.   lactulose 10 GM/15ML solution Commonly known as:  CHRONULAC Take 10 g by mouth 3 (three) times daily.   metolazone 5 MG tablet Commonly known as: ZAROXOLYN TAKE 1 TABLE EVERY 3 DAYS AS NEEDED FOR MORE THAN 4 POUND WEIGHT GAIN. What changed: See the new instructions.   metoprolol succinate 50 MG 24 hr tablet Commonly known as: TOPROL-XL TAKE 3 TABLETS ONCE A DAY Take with or immediately following a meal.   NEOSPORIN EX Apply 1 Application topically daily as needed (wound Treatment).   OneTouch Delica Plus Lancet33G Misc Apply topically 3 (three) times daily.   OneTouch Verio test strip Generic drug: glucose blood 1 each 3 (three) times daily.   Ozempic (2 MG/DOSE) 8 MG/3ML Sopn Generic drug: Semaglutide (2 MG/DOSE) Inject 2 mg into the skin once a week. What changed: when to take this   potassium chloride 10 MEQ CR capsule Commonly known as: MICRO-K Take 40 mEq by mouth 2 (two) times daily.   PreserVision AREDS 2 Caps Take 1 capsule by mouth 2 (two) times daily.   Systane Balance 0.6 % Soln Generic drug: Propylene Glycol Place 1 drop into both eyes 2 (two) times daily.   Xifaxan 550 MG Tabs tablet Generic drug: rifaximin Take 550 mg by mouth 2 (two) times daily.          Electronically Signed: Hoyt Koch, PA 12/25/2022, 10:14 AM   I have spent Greater Than 30 Minutes discharging Hannah Khan.

## 2022-12-27 DIAGNOSIS — L03311 Cellulitis of abdominal wall: Secondary | ICD-10-CM | POA: Diagnosis not present

## 2022-12-27 DIAGNOSIS — M170 Bilateral primary osteoarthritis of knee: Secondary | ICD-10-CM | POA: Diagnosis not present

## 2022-12-27 DIAGNOSIS — A419 Sepsis, unspecified organism: Secondary | ICD-10-CM | POA: Diagnosis not present

## 2022-12-27 DIAGNOSIS — D649 Anemia, unspecified: Secondary | ICD-10-CM | POA: Diagnosis not present

## 2022-12-27 DIAGNOSIS — I5032 Chronic diastolic (congestive) heart failure: Secondary | ICD-10-CM | POA: Diagnosis not present

## 2022-12-27 DIAGNOSIS — I4819 Other persistent atrial fibrillation: Secondary | ICD-10-CM | POA: Diagnosis not present

## 2022-12-27 DIAGNOSIS — G4733 Obstructive sleep apnea (adult) (pediatric): Secondary | ICD-10-CM | POA: Diagnosis not present

## 2022-12-27 DIAGNOSIS — M797 Fibromyalgia: Secondary | ICD-10-CM | POA: Diagnosis not present

## 2022-12-27 DIAGNOSIS — E119 Type 2 diabetes mellitus without complications: Secondary | ICD-10-CM | POA: Diagnosis not present

## 2022-12-28 DIAGNOSIS — L7211 Pilar cyst: Secondary | ICD-10-CM | POA: Diagnosis not present

## 2022-12-28 DIAGNOSIS — C44622 Squamous cell carcinoma of skin of right upper limb, including shoulder: Secondary | ICD-10-CM | POA: Diagnosis not present

## 2022-12-28 DIAGNOSIS — B078 Other viral warts: Secondary | ICD-10-CM | POA: Diagnosis not present

## 2022-12-28 DIAGNOSIS — D485 Neoplasm of uncertain behavior of skin: Secondary | ICD-10-CM | POA: Diagnosis not present

## 2022-12-28 DIAGNOSIS — L821 Other seborrheic keratosis: Secondary | ICD-10-CM | POA: Diagnosis not present

## 2022-12-28 DIAGNOSIS — D692 Other nonthrombocytopenic purpura: Secondary | ICD-10-CM | POA: Diagnosis not present

## 2022-12-28 DIAGNOSIS — Z85828 Personal history of other malignant neoplasm of skin: Secondary | ICD-10-CM | POA: Diagnosis not present

## 2022-12-29 ENCOUNTER — Encounter (HOSPITAL_BASED_OUTPATIENT_CLINIC_OR_DEPARTMENT_OTHER): Payer: Self-pay | Admitting: Family

## 2022-12-29 ENCOUNTER — Ambulatory Visit (HOSPITAL_BASED_OUTPATIENT_CLINIC_OR_DEPARTMENT_OTHER): Payer: Medicare PPO | Admitting: Family

## 2022-12-29 VITALS — BP 123/72 | HR 106 | Ht 67.0 in | Wt 338.6 lb

## 2022-12-29 DIAGNOSIS — G4733 Obstructive sleep apnea (adult) (pediatric): Secondary | ICD-10-CM | POA: Diagnosis not present

## 2022-12-29 DIAGNOSIS — K7460 Unspecified cirrhosis of liver: Secondary | ICD-10-CM | POA: Diagnosis not present

## 2022-12-29 DIAGNOSIS — I5032 Chronic diastolic (congestive) heart failure: Secondary | ICD-10-CM | POA: Diagnosis not present

## 2022-12-29 DIAGNOSIS — D696 Thrombocytopenia, unspecified: Secondary | ICD-10-CM

## 2022-12-29 DIAGNOSIS — I4821 Permanent atrial fibrillation: Secondary | ICD-10-CM | POA: Diagnosis not present

## 2022-12-29 DIAGNOSIS — I10 Essential (primary) hypertension: Secondary | ICD-10-CM

## 2022-12-29 DIAGNOSIS — K7581 Nonalcoholic steatohepatitis (NASH): Secondary | ICD-10-CM | POA: Diagnosis not present

## 2022-12-29 MED ORDER — METOLAZONE 5 MG PO TABS
5.0000 mg | ORAL_TABLET | ORAL | 3 refills | Status: DC
Start: 2022-12-29 — End: 2023-02-28

## 2022-12-29 MED ORDER — BUMETANIDE 2 MG PO TABS: 2.0000 mg | ORAL_TABLET | Freq: Every day | ORAL | 3 refills | Status: DC

## 2022-12-29 NOTE — Progress Notes (Signed)
Office Visit    Patient Name: Hannah Khan Date of Encounter: 12/29/2022  PCP:  Hannah Bussing, MD   [facility]  Cardiologist:  Hannah Si, MD  Advanced Practice Provider:  No care team member to display Electrophysiologist:  None   Chief Complaint    Hannah Khan is a 67 y.o. female presents today for follow-up after canceled TIPS  Past Medical History    Past Medical History:  Diagnosis Date   Anemia    hx of   Arthritis    knees   CHF (congestive heart failure)    chronic diastolic   Diabetes mellitus without complication    type 2   Dyspnea    Dysrhythmia    Fibromyalgia    Gastric varices    large   H/O transfusion of platelets    Heart murmur    never has caused any problems   Hx of colonic polyps    s/p partial colectomy   Hyperlipidemia    Hypertension    Joint pain    in knees and back spasms   Left leg swelling    wear compression hose   Liver cirrhosis secondary to NASH (nonalcoholic steatohepatitis) dx nov 2014   had enlarged spleen also    Morbid obesity    Persistent atrial fibrillation    Pneumonia 10/2016   x 5   PONV (postoperative nausea and vomiting)    in past none recent   Portal hypertension    Sleep apnea    uses cpap, setting varies between 14-20   Spleen enlarged    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    Past Surgical History:  Procedure Laterality Date   BREAST LUMPECTOMY WITH RADIOACTIVE SEED LOCALIZATION Right 08/29/2017   Procedure: RIGHT BREAST LUMPECTOMY WITH RADIOACTIVE SEEDS LOCALIZATION;  Surgeon: Hannah Bouillon, MD;  Location: [facility] OR;  Service: General;  Laterality: Right;   CESAREAN SECTION  1989   CHOLECYSTECTOMY  20 yrs ago   COLECTOMY  2011   COLONOSCOPY     COLONOSCOPY N/A 09/05/2018   Procedure: COLONOSCOPY;  Surgeon: Hannah Modena, MD;  Location: [facility];  Service: Endoscopy;  Laterality: N/A;   COLONOSCOPY WITH PROPOFOL Bilateral 05/04/2022   Procedure:  COLONOSCOPY WITH PROPOFOL;  Surgeon: Hannah Modena, MD;  Location: [facility];  Service: Gastroenterology;  Laterality: Bilateral;   DILATATION & CURETTAGE/HYSTEROSCOPY WITH MYOSURE N/A 05/06/2016   Procedure: DILATATION & CURETTAGE/HYSTEROSCOPY WITH MYOSURE WITH POLYPECTOMY;  Surgeon: Hannah Hidalgo, DO;  Location: [facility];  Service: Gynecology;  Laterality: N/A;   ESOPHAGOGASTRODUODENOSCOPY (EGD) WITH PROPOFOL N/A 09/04/2013   Procedure: ESOPHAGOGASTRODUODENOSCOPY (EGD) WITH PROPOFOL;  Surgeon: Hannah Modena, MD;  Location: [facility];  Service: Endoscopy;  Laterality: N/A;   ESOPHAGOGASTRODUODENOSCOPY (EGD) WITH PROPOFOL Bilateral 05/04/2022   Procedure: ESOPHAGOGASTRODUODENOSCOPY (EGD) WITH PROPOFOL;  Surgeon: Hannah Modena, MD;  Location: [facility];  Service: Gastroenterology;  Laterality: Bilateral;   IR INTRAVASCULAR ULTRASOUND NON CORONARY  12/23/2022   IR RADIOLOGIST EVAL & MGMT  09/21/2022   IR RADIOLOGIST EVAL & MGMT  10/06/2022   IR RADIOLOGIST EVAL & MGMT  11/02/2022   IR TIPS  12/23/2022   IR US GUIDE VASC ACCESS RIGHT  12/23/2022   KNEE ARTHROSCOPY  yrs ago   bilateral, one done x 1, one done twice   POLYPECTOMY  09/05/2018   Procedure: POLYPECTOMY;  Surgeon: Hannah Modena, MD;  Location: [facility];  Service: Endoscopy;;   RADIOLOGY WITH ANESTHESIA N/A 12/23/2022   Procedure: TIPS;  Surgeon: Hannah Dallas, MD;  Location: [facility] OR;  Service: Radiology;  Laterality: N/A;    Allergies  Allergies  Allergen Reactions   Celecoxib Other (See Comments)    "speech slurred" with celebrex   Shellfish Allergy Swelling and Rash    TINGLING AND SWELLING OF LIPS. LARGE WHELPS QUARTER-SIZE   Barium-Containing Compounds Other (See Comments)    TACHYCARDIA   Prednisone Other (See Comments)    TACHYCARDIA   Statins Other (See Comments)    AGGRAVATED FIBROMYALGIA   Fentanyl     Hallucinations    Ibuprofen Other (See Comments)    UNSPECIFIED SPECIFIC REACTION >> "DUE TO PLATELETS"    Tramadol     Hallucination    Tylenol [Acetaminophen] Other (See Comments)    UNSPECIFIED SPECIFIC REACTION >> "DUE TO PLATELETS"   Zetia [Ezetimibe]     Headache    History of Present Illness    Hannah Khan is a 67 y.o. female with a hx of chronic diastolic heart failure, morbid obesity, OSA, NASH/cirrhosis, chronic thrombocytopenia, DM2, vasovagal presyncope, chronic atrial fibrillation not on anticoagulation last seen 12/12/2022  ED visit 02/2020 with atrial fibrillation requiring diltiazem gtt. At clinic follow up July 2021 noted dyspnea and fluid retention, Bumex increased for short time. At follow up 04/2020 weight and edema were improved.  She saw Hannah Khan 07/2020 and decided upon medical management as she was overall asymptomatic.   Admitted 01/2021 with atrial fibrillation and hepatic encephalopathy.  Admitted 03/17/21-03/22/21 with fever and hypoxia treated for CAP. 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 Readmitted 03/2021 with acute on chronic diastolic heart failure suspected due to dietary indiscretion. Hannah Khan was started. Bumex 2mg  daily continued with Metolazone PRN for weight gain. She did have episode of vasovagal presyncope associated with IV magnesium with CT head and neurology consult unremarkable. She saw Hannah Khan 06/14/21 - Diltaizem reduced due to hypotension and Metoprolol increased to 75mg  BID. Spironolactone previously trialed but had to be discontinued due to hypotension.  Her diltiazem has been gradually reduced due to hypotension.  Bumex and metolazone have been escalated.    Admitted 2/25-2/27/24 with cellulitis and sepsis treated with antibiotics. Her 11/21/22 TIPS was cancelled due to car trouble.  Last seen 12/12/2022.  At that time she noted morning BP as low as 80s over 50s associate with nausea.  Subsequent ammonia levels unremarkable.  She was often self adjusting her Bumex.  She was encouraged to take Bumex,  metolazone as prescribed.  Presents today for follow-up with her husband.  She was to undergo TIPS 12/23/2022 but procedure had to be aborted due to right atrial pressure of 23 mmHg.  Tells me the day after her TIPS her weight went up to a size 351 pounds and then the next day she subsequently lost 20 pounds after taking her metolazone.  She has continued to take Bumex only once daily or sometimes missing doses as she is bothered by urinary frequency.  She is taking metolazone approximately once per week.  Reports she is still nauseous most often in the morning between 8:30 AM and 10:30 AM. More noticeable when she is in the car. Feels real dizzy first thing in the morning when sitting on the side of her bed.  Discussed orthostatic precautions such as making position changes slowly.  Lowest weight over the last year 318 lbs on her home scale and 324 lbs on our clinic scale.   Drinking water, sprite.  She has a 24 oz tumbler with ice - fills 5 to 7 times per day.  Rated need to restrict of less than 64 ounces per day.  EKGs/Labs/Other Studies Reviewed:   The following studies were reviewed today:   Echocardiogram 03/20/2021:  1. Technically difficult; normal LV function; enlarged right heart.   2. Left ventricular ejection fraction, by estimation, is 60 to 65%. The  left ventricle has normal function. The left ventricle has no regional  wall motion abnormalities. There is mild left ventricular hypertrophy.  Left ventricular diastolic parameters  are indeterminate.   3. Right ventricular systolic function is normal. The right ventricular  size is moderately enlarged. There is normal pulmonary artery systolic  pressure.   4. Left atrial size was mildly dilated.   5. Right atrial size was severely dilated.   6. Large pleural effusion.   7. The mitral valve is normal in structure. Trivial mitral valve  regurgitation. No evidence of mitral stenosis.   8. The aortic valve is calcified. Aortic valve  regurgitation is not  visualized. No aortic stenosis is present.   9. Aortic dilatation noted. There is borderline dilatation of the  ascending aorta, measuring 38 mm.  10. The inferior vena cava is dilated in size with >50% respiratory  variability, suggesting right atrial pressure of 8 mmHg.  EKG:  EKG is not ordered today.   Recent Labs: 11/12/2022: B Natriuretic Peptide 434.3 11/15/2022: Magnesium 1.8 12/23/2022: ALT 20; BUN 14; Creatinine, Ser 0.80; Hemoglobin 11.6; Platelets 44; Potassium 3.3; Sodium 142  Recent Lipid Panel    Component Value Date/Time   CHOL 167 07/14/2022 0943   TRIG 77 07/14/2022 0943   HDL 84 07/14/2022 0943   CHOLHDL 2.0 07/14/2022 0943   CHOLHDL 3.0 07/02/2014 1138   VLDL 19 07/02/2014 1138   LDLCALC 69 07/14/2022 0943    Home Medications   Current Meds  Medication Sig   albuterol (PROVENTIL) (2.5 MG/3ML) 0.083% nebulizer solution Take 3 mLs (2.5 mg total) by nebulization every 6 (six) hours as needed for wheezing or shortness of breath.   albuterol (VENTOLIN HFA) 108 (90 Base) MCG/ACT inhaler Inhale 1-2 puffs into the lungs every 6 (six) hours as needed for wheezing or shortness of breath.   Bacitracin-Polymyxin B (NEOSPORIN EX) Apply 1 Application topically daily as needed (wound Treatment).   bumetanide (BUMEX) 2 MG tablet Take 1 tablet (2 mg total) by mouth 2 (two) times daily. (Patient taking differently: Take 2 mg by mouth See admin instructions. Take 2 mg daily, may take a second 2 mg dose as needed for weight gain)   diclofenac Sodium (VOLTAREN) 1 % GEL Apply 1 Application topically at bedtime as needed (pain).   diltiazem (CARDIZEM CD) 180 MG 24 hr capsule TAKE 1 CAPSULE BY MOUTH EVERY DAY (Patient taking differently: Take 180 mg by mouth every evening.)   empagliflozin (JARDIANCE) 25 MG TABS tablet Take 25 mg by mouth in the morning.   lactulose (CHRONULAC) 10 GM/15ML solution Take 10 g by mouth 3 (three) times daily.   Lancets (ONETOUCH DELICA  PLUS LANCET33G) MISC Apply topically 3 (three) times daily.   metolazone (ZAROXOLYN) 5 MG tablet TAKE 1 TABLE EVERY 3 DAYS AS NEEDED FOR MORE THAN 4 POUND WEIGHT GAIN. (Patient taking differently: Take 5 mg by mouth See admin instructions. Take 1 table every 3 days  as needed for more than 4 pound weight gain.)   metoprolol succinate (TOPROL-XL) 50 MG 24 hr tablet TAKE 3 TABLETS ONCE  A DAY Take with or immediately following a meal.   Multiple Vitamins-Minerals (PRESERVISION AREDS 2) CAPS Take 1 capsule by mouth 2 (two) times daily.   ONETOUCH VERIO test strip 1 each 3 (three) times daily.   potassium chloride (MICRO-K) 10 MEQ CR capsule Take 40 mEq by mouth 2 (two) times daily.   Propylene Glycol (SYSTANE BALANCE) 0.6 % SOLN Place 1 drop into both eyes 2 (two) times daily.   Semaglutide, 2 MG/DOSE, (OZEMPIC, 2 MG/DOSE,) 8 MG/3ML SOPN Inject 2 mg into the skin once a week. (Patient taking differently: Inject 2 mg into the skin every Thursday.)   XIFAXAN 550 MG TABS tablet Take 550 mg by mouth 2 (two) times daily.     Review of Systems      All other systems reviewed and are otherwise negative except as noted above.  Physical Exam    VS:  BP 123/72 (BP Location: Left Arm, Patient Position: Sitting, Cuff Size: Large)   Pulse (!) 106   Ht 5\' 7"  (1.702 m)   Wt (!) 338 lb 9.6 oz (153.6 kg)   SpO2 95%   BMI 53.03 kg/m  , BMI Body mass index is 53.03 kg/m.  Wt Readings from Last 3 Encounters:  12/29/22 (!) 338 lb 9.6 oz (153.6 kg)  12/23/22 (!) 329 lb (149.2 kg)  12/12/22 (!) 332 lb (150.6 kg)    GEN: Well nourished, overweight, well developed, in no acute distress. HEENT: normal. Neck: Supple, no JVD, carotid bruits, or masses. Cardiac: IRIR, gr 2/6 systolic murmur, no  rubs or gallops. No clubbing, cyanosis, edema.  Radials/PT 2+ and equal bilaterally.  Respiratory:  Respirations regular and unlabored, clear to auscultation bilaterally. GI: Soft, nontender, nondistended. MS: No  deformity or atrophy. Skin: Warm and dry, no rash. Neuro:  Strength and sensation are intact. Psych: Normal affect.  Assessment & Plan     Chronic diastolic heart failure -Low sodium diet, fluid restriction <2L, and daily weights encouraged. Educated to contact our office for weight gain of 2 lbs overnight or 5 lbs in one week.  Due to elevated right atrial pressure 23 mmHg during recent TIPS which had to be canceled reiterated need for regular utilization for diuretics.  We discussed that as she takes diuretics routinely she in the long-term will have less urinary output as volume status is better controlled.  Will adjust diuretic regimen and metolazone 5 mg twice per week and Bumex 2 mg daily.  Prompt follow-up in 2 weeks with labs at that time.   Discussed with Dr. Elby Showers post visit - plan for echo prior to reattempt at TIPS. He will re-evaluate her in his portal hypertension clinic 01/2023  Persistent atrial fibrillation - Previously intolerant to digoxin. No anticoagulation due to high bleed risk and cirrhosis. Not candidate for Watchman. Continue Toprol 150mg  QD and diltiazem 180 daily.  OSA - CPAP compliance encouraged.   Thrombocytopenia - Follows with Dr. Clelia Croft of hematology.   Morbid obesity - Weight loss via diet and exercise encouraged. Discussed the impact being overweight would have on cardiovascular risk. On Ozempic per endocrinology. Previously declined referral to PREP exercise program.   DM2 - Continue to follow with PCP. Continue Jardiance for cardioprotective and heart failure benefit.   Liver cirrhosis secondary to nonalcoholic steato hepatitis-  Continue to follow with GI and PCP.   Disposition: Follow up in 2 weeks with Hannah Khan or Alver Sorrow, NP   Signed, Alver Sorrow, NP 12/29/2022, 1:30 PM [facility]

## 2022-12-29 NOTE — Patient Instructions (Signed)
Medication Instructions:  Your physician has recommended you make the following change in your medication:  Change: Metolazone twice weekly   Change: bumex to once per day   Follow-Up: At [facility], you and your health needs are our priority.  As part of our continuing mission to provide you with exceptional heart care, we have created designated Provider Care Teams.  These Care Teams include your primary Cardiologist (physician) and Advanced Practice Providers (APPs -  Physician Assistants and Nurse Practitioners) who all work together to provide you with the care you need, when you need it.  We recommend signing up for the patient portal called "MyChart".  Sign up information is provided on this After Visit Summary.  MyChart is used to connect with patients for Virtual Visits (Telemedicine).  Patients are able to view lab/test results, encounter notes, upcoming appointments, etc.  Non-urgent messages can be sent to your provider as well.   To learn more about what you can do with MyChart, go to [email or web address].    Your next appointment:   2 week(s)  Provider:   Gillian Shields, NP

## 2022-12-30 ENCOUNTER — Encounter (HOSPITAL_BASED_OUTPATIENT_CLINIC_OR_DEPARTMENT_OTHER): Payer: Self-pay | Admitting: Family

## 2022-12-30 DIAGNOSIS — L03311 Cellulitis of abdominal wall: Secondary | ICD-10-CM | POA: Diagnosis not present

## 2022-12-30 DIAGNOSIS — E119 Type 2 diabetes mellitus without complications: Secondary | ICD-10-CM | POA: Diagnosis not present

## 2022-12-30 DIAGNOSIS — G4733 Obstructive sleep apnea (adult) (pediatric): Secondary | ICD-10-CM | POA: Diagnosis not present

## 2022-12-30 DIAGNOSIS — M797 Fibromyalgia: Secondary | ICD-10-CM | POA: Diagnosis not present

## 2022-12-30 DIAGNOSIS — I4819 Other persistent atrial fibrillation: Secondary | ICD-10-CM | POA: Diagnosis not present

## 2022-12-30 DIAGNOSIS — M170 Bilateral primary osteoarthritis of knee: Secondary | ICD-10-CM | POA: Diagnosis not present

## 2022-12-30 DIAGNOSIS — D649 Anemia, unspecified: Secondary | ICD-10-CM | POA: Diagnosis not present

## 2022-12-30 DIAGNOSIS — I5032 Chronic diastolic (congestive) heart failure: Secondary | ICD-10-CM | POA: Diagnosis not present

## 2022-12-30 DIAGNOSIS — A419 Sepsis, unspecified organism: Secondary | ICD-10-CM | POA: Diagnosis not present

## 2023-01-02 DIAGNOSIS — E119 Type 2 diabetes mellitus without complications: Secondary | ICD-10-CM | POA: Diagnosis not present

## 2023-01-02 DIAGNOSIS — I5032 Chronic diastolic (congestive) heart failure: Secondary | ICD-10-CM | POA: Diagnosis not present

## 2023-01-02 DIAGNOSIS — A419 Sepsis, unspecified organism: Secondary | ICD-10-CM | POA: Diagnosis not present

## 2023-01-02 DIAGNOSIS — I4819 Other persistent atrial fibrillation: Secondary | ICD-10-CM | POA: Diagnosis not present

## 2023-01-02 DIAGNOSIS — G4733 Obstructive sleep apnea (adult) (pediatric): Secondary | ICD-10-CM | POA: Diagnosis not present

## 2023-01-02 DIAGNOSIS — D649 Anemia, unspecified: Secondary | ICD-10-CM | POA: Diagnosis not present

## 2023-01-02 DIAGNOSIS — L03311 Cellulitis of abdominal wall: Secondary | ICD-10-CM | POA: Diagnosis not present

## 2023-01-02 DIAGNOSIS — M170 Bilateral primary osteoarthritis of knee: Secondary | ICD-10-CM | POA: Diagnosis not present

## 2023-01-02 DIAGNOSIS — M797 Fibromyalgia: Secondary | ICD-10-CM | POA: Diagnosis not present

## 2023-01-11 DIAGNOSIS — M170 Bilateral primary osteoarthritis of knee: Secondary | ICD-10-CM | POA: Diagnosis not present

## 2023-01-11 DIAGNOSIS — I5032 Chronic diastolic (congestive) heart failure: Secondary | ICD-10-CM | POA: Diagnosis not present

## 2023-01-11 DIAGNOSIS — E119 Type 2 diabetes mellitus without complications: Secondary | ICD-10-CM | POA: Diagnosis not present

## 2023-01-11 DIAGNOSIS — A419 Sepsis, unspecified organism: Secondary | ICD-10-CM | POA: Diagnosis not present

## 2023-01-11 DIAGNOSIS — M797 Fibromyalgia: Secondary | ICD-10-CM | POA: Diagnosis not present

## 2023-01-11 DIAGNOSIS — G4733 Obstructive sleep apnea (adult) (pediatric): Secondary | ICD-10-CM | POA: Diagnosis not present

## 2023-01-11 DIAGNOSIS — L03311 Cellulitis of abdominal wall: Secondary | ICD-10-CM | POA: Diagnosis not present

## 2023-01-11 DIAGNOSIS — D649 Anemia, unspecified: Secondary | ICD-10-CM | POA: Diagnosis not present

## 2023-01-11 DIAGNOSIS — I4819 Other persistent atrial fibrillation: Secondary | ICD-10-CM | POA: Diagnosis not present

## 2023-01-12 ENCOUNTER — Ambulatory Visit (HOSPITAL_BASED_OUTPATIENT_CLINIC_OR_DEPARTMENT_OTHER): Payer: Medicare PPO | Admitting: Family

## 2023-01-12 ENCOUNTER — Encounter (HOSPITAL_BASED_OUTPATIENT_CLINIC_OR_DEPARTMENT_OTHER): Payer: Self-pay | Admitting: Family

## 2023-01-12 VITALS — BP 106/60 | HR 94 | Ht 67.0 in | Wt 332.0 lb

## 2023-01-12 DIAGNOSIS — I4821 Permanent atrial fibrillation: Secondary | ICD-10-CM | POA: Diagnosis not present

## 2023-01-12 DIAGNOSIS — K7460 Unspecified cirrhosis of liver: Secondary | ICD-10-CM | POA: Diagnosis not present

## 2023-01-12 DIAGNOSIS — I5032 Chronic diastolic (congestive) heart failure: Secondary | ICD-10-CM

## 2023-01-12 DIAGNOSIS — K7581 Nonalcoholic steatohepatitis (NASH): Secondary | ICD-10-CM | POA: Diagnosis not present

## 2023-01-12 DIAGNOSIS — G4733 Obstructive sleep apnea (adult) (pediatric): Secondary | ICD-10-CM

## 2023-01-12 NOTE — Patient Instructions (Signed)
Medication Instructions:  Your physician has recommended you make the following change in your medication:    Continue Metolazone twice per week and Bumex once per day Would try taking fluid pills after your breakfast  May use over the counter Meclizine as needed for vertigo  *If you need a refill on your cardiac medications before your next appointment, please call your pharmacy*   Lab Work: Your physician recommends that you return for lab work today: BMP  If you have labs (blood work) drawn today and your tests are completely normal, you will receive your results only by: MyChart Message (if you have MyChart) OR A paper copy in the mail If you have any lab test that is abnormal or we need to change your treatment, we will call you to review the results.   Follow-Up: At [facility], you and your health needs are our priority.  As part of our continuing mission to provide you with exceptional heart care, we have created designated Provider Care Teams.  These Care Teams include your primary Cardiologist (physician) and Advanced Practice Providers (APPs -  Physician Assistants and Nurse Practitioners) who all work together to provide you with the care you need, when you need it.  We recommend signing up for the patient portal called "MyChart".  Sign up information is provided on this After Visit Summary.  MyChart is used to connect with patients for Virtual Visits (Telemedicine).  Patients are able to view lab/test results, encounter notes, upcoming appointments, etc.  Non-urgent messages can be sent to your provider as well.   To learn more about what you can do with MyChart, go to [email or web address].    Your next appointment:   1 month(s)  Provider:   Chilton Si, MD or Gillian Shields, NP    Other Instructions  Ensure restricting to less than 64 oz (2L) of fluid per day.   Recommend weighing daily and keeping a log. Please call our office if you have weight  gain of 2 pounds overnight or 5 pounds in 1 week.   Date  Time Weight

## 2023-01-12 NOTE — Progress Notes (Signed)
Office Visit    Patient Name: Hannah Khan Date of Encounter: 01/12/2023  PCP:  Darrow Bussing, MD   [facility]  Cardiologist:  Chilton Si, MD  Advanced Practice Provider:  No care team member to display Electrophysiologist:  None   Chief Complaint    Hannah Khan is a 67 y.o. female presents today for follow-up of diastolic heart failure  Past Medical History    Past Medical History:  Diagnosis Date   Anemia    hx of   Arthritis    knees   CHF (congestive heart failure)    chronic diastolic   Diabetes mellitus without complication    type 2   Dyspnea    Dysrhythmia    Fibromyalgia    Gastric varices    large   H/O transfusion of platelets    Heart murmur    never has caused any problems   Hx of colonic polyps    s/p partial colectomy   Hyperlipidemia    Hypertension    Joint pain    in knees and back spasms   Left leg swelling    wear compression hose   Liver cirrhosis secondary to NASH (nonalcoholic steatohepatitis) dx nov 2014   had enlarged spleen also    Morbid obesity    Persistent atrial fibrillation    Pneumonia 10/2016   x 5   PONV (postoperative nausea and vomiting)    in past none recent   Portal hypertension    Sleep apnea    uses cpap, setting varies between 14-20   Spleen enlarged    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    Past Surgical History:  Procedure Laterality Date   BREAST LUMPECTOMY WITH RADIOACTIVE SEED LOCALIZATION Right 08/29/2017   Procedure: RIGHT BREAST LUMPECTOMY WITH RADIOACTIVE SEEDS LOCALIZATION;  Surgeon: Harriette Bouillon, MD;  Location: [facility] OR;  Service: General;  Laterality: Right;   CESAREAN SECTION  1989   CHOLECYSTECTOMY  20 yrs ago   COLECTOMY  2011   COLONOSCOPY     COLONOSCOPY N/A 09/05/2018   Procedure: COLONOSCOPY;  Surgeon: Willis Modena, MD;  Location: [facility];  Service: Endoscopy;  Laterality: N/A;   COLONOSCOPY WITH PROPOFOL Bilateral 05/04/2022    Procedure: COLONOSCOPY WITH PROPOFOL;  Surgeon: Willis Modena, MD;  Location: [facility];  Service: Gastroenterology;  Laterality: Bilateral;   DILATATION & CURETTAGE/HYSTEROSCOPY WITH MYOSURE N/A 05/06/2016   Procedure: DILATATION & CURETTAGE/HYSTEROSCOPY WITH MYOSURE WITH POLYPECTOMY;  Surgeon: Myna Hidalgo, DO;  Location: [facility];  Service: Gynecology;  Laterality: N/A;   ESOPHAGOGASTRODUODENOSCOPY (EGD) WITH PROPOFOL N/A 09/04/2013   Procedure: ESOPHAGOGASTRODUODENOSCOPY (EGD) WITH PROPOFOL;  Surgeon: Willis Modena, MD;  Location: [facility];  Service: Endoscopy;  Laterality: N/A;   ESOPHAGOGASTRODUODENOSCOPY (EGD) WITH PROPOFOL Bilateral 05/04/2022   Procedure: ESOPHAGOGASTRODUODENOSCOPY (EGD) WITH PROPOFOL;  Surgeon: Willis Modena, MD;  Location: [facility];  Service: Gastroenterology;  Laterality: Bilateral;   IR INTRAVASCULAR ULTRASOUND NON CORONARY  12/23/2022   IR RADIOLOGIST EVAL & MGMT  09/21/2022   IR RADIOLOGIST EVAL & MGMT  10/06/2022   IR RADIOLOGIST EVAL & MGMT  11/02/2022   IR TIPS  12/23/2022   IR US GUIDE VASC ACCESS RIGHT  12/23/2022   KNEE ARTHROSCOPY  yrs ago   bilateral, one done x 1, one done twice   POLYPECTOMY  09/05/2018   Procedure: POLYPECTOMY;  Surgeon: Willis Modena, MD;  Location: [facility];  Service: Endoscopy;;   RADIOLOGY WITH ANESTHESIA N/A 12/23/2022   Procedure: TIPS;  Surgeon: Bennie Dallas, MD;  Location: [facility] OR;  Service: Radiology;  Laterality: N/A;    Allergies  Allergies  Allergen Reactions   Celecoxib Other (See Comments)    "speech slurred" with celebrex   Shellfish Allergy Swelling and Rash    TINGLING AND SWELLING OF LIPS. LARGE WHELPS QUARTER-SIZE   Barium-Containing Compounds Other (See Comments)    TACHYCARDIA   Prednisone Other (See Comments)    TACHYCARDIA   Statins Other (See Comments)    AGGRAVATED FIBROMYALGIA   Fentanyl     Hallucinations    Ibuprofen Other (See Comments)    UNSPECIFIED SPECIFIC REACTION >> "DUE TO  PLATELETS"   Tramadol     Hallucination    Tylenol [Acetaminophen] Other (See Comments)    UNSPECIFIED SPECIFIC REACTION >> "DUE TO PLATELETS"   Zetia [Ezetimibe]     Headache    History of Present Illness    Hannah Khan is a 67 y.o. female with a hx of chronic diastolic heart failure, morbid obesity, OSA, NASH/cirrhosis, chronic thrombocytopenia, DM2, vasovagal presyncope, chronic atrial fibrillation not on anticoagulation last seen 12/29/2022  ED visit 02/2020 with atrial fibrillation requiring diltiazem gtt. At clinic follow up July 2021 noted dyspnea and fluid retention, Bumex increased for short time. At follow up 04/2020 weight and edema were improved.  She saw Dr. Ladona Ridgel 07/2020 and decided upon medical management as she was overall asymptomatic.   Admitted 01/2021 with atrial fibrillation and hepatic encephalopathy.  Admitted 03/17/21-03/22/21 with fever and hypoxia treated for CAP. 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 Readmitted 03/2021 with acute on chronic diastolic heart failure suspected due to dietary indiscretion. Marcelline Deist was started. Bumex 2mg  daily continued with Metolazone PRN for weight gain. She did have episode of vasovagal presyncope associated with IV magnesium with CT head and neurology consult unremarkable. She saw Dr. Duke Salvia 06/14/21 - Diltaizem reduced due to hypotension and Metoprolol increased to 75mg  BID. Spironolactone previously trialed but had to be discontinued due to hypotension.  Her diltiazem has been gradually reduced due to hypotension.  Bumex and metolazone have been escalated.    Admitted 2/25-2/27/24 with cellulitis and sepsis treated with antibiotics. Her 11/21/22 TIPS was cancelled due to car trouble.  Seen 12/12/2022.  At that time she noted morning BP as low as 80s over 50s associate with nausea.  Subsequent ammonia levels unremarkable.  She was often self adjusting her Bumex.  She was encouraged to take Bumex,  metolazone as prescribed.   She was to undergo TIPS 12/23/2022 but procedure had to be aborted due to right atrial pressure of 23 mmHg.   Last seen 12/29/2022.  At that time she noted weight had gone up to 381 pounds today after TIPS procedure and at clinic visit 12/29/2022 was 338 pounds.  She continued to self adjust her diuretics to urinary frequency.  She was recommended for metolazone 5 mg twice per week and Bumex 2 mg daily.  Lowest weight over the last year 318 pounds on her home scale and 324 pounds on our clinic scale.  Need for less than 64 ounces of fluid per day reiterated.  Noted persistent nausea for which prior ammonia levels unremarkable nausea, to follow with primary care provider.  Notes today for follow-up. Since last seen enjoyed a trip to [place] for a wedding.  Weight down 6 pounds from clinic visit 2 weeks ago with weight of 332 pounds on our scale.  She has follow-up  with Dr. Elby Showers in May - previously discussed with him plan to repeat echo prior to re-booking TIPS.   Notes her breathing has been overall okay. No chest pain, pressure, tightness. Notes during travel days her diuretic was difficult as she did not take Bumex. Also has missed doses of Bumex on busy days due to urinary frequency. Reiterated need to take regularly. Notes she had 4 days during her trip she did not take her fluid pill. Weight went up 9 lbs but feels it is back down. Weight at home this morning 329 lbs. Lowest weight over the last year 318 lbs on her home scale and 324 lbs on our clinic scale. Does note she has more fluid output on days she takes Metolazone. Has been working to reduce fluid intake to <2L.  Still with episodes of nausea, dry heaves a few times per week in the morning. Recommended to trial medications after breakfast. Prior ammonia level WNL. No confusion. Sometimes will wake with vertigo like dizziness. Recommend OTC Meclizine.   EKGs/Labs/Other Studies Reviewed:   The following studies were  reviewed today:   Echocardiogram 03/20/2021:  1. Technically difficult; normal LV function; enlarged right heart.   2. Left ventricular ejection fraction, by estimation, is 60 to 65%. The  left ventricle has normal function. The left ventricle has no regional  wall motion abnormalities. There is mild left ventricular hypertrophy.  Left ventricular diastolic parameters  are indeterminate.   3. Right ventricular systolic function is normal. The right ventricular  size is moderately enlarged. There is normal pulmonary artery systolic  pressure.   4. Left atrial size was mildly dilated.   5. Right atrial size was severely dilated.   6. Large pleural effusion.   7. The mitral valve is normal in structure. Trivial mitral valve  regurgitation. No evidence of mitral stenosis.   8. The aortic valve is calcified. Aortic valve regurgitation is not  visualized. No aortic stenosis is present.   9. Aortic dilatation noted. There is borderline dilatation of the  ascending aorta, measuring 38 mm.  10. The inferior vena cava is dilated in size with >50% respiratory  variability, suggesting right atrial pressure of 8 mmHg.  EKG:  EKG is not ordered today.   Recent Labs: 11/12/2022: B Natriuretic Peptide 434.3 11/15/2022: Magnesium 1.8 12/23/2022: ALT 20; BUN 14; Creatinine, Ser 0.80; Hemoglobin 11.6; Platelets 44; Potassium 3.3; Sodium 142  Recent Lipid Panel    Component Value Date/Time   CHOL 167 07/14/2022 0943   TRIG 77 07/14/2022 0943   HDL 84 07/14/2022 0943   CHOLHDL 2.0 07/14/2022 0943   CHOLHDL 3.0 07/02/2014 1138   VLDL 19 07/02/2014 1138   LDLCALC 69 07/14/2022 0943    Home Medications   Current Meds  Medication Sig   albuterol (PROVENTIL) (2.5 MG/3ML) 0.083% nebulizer solution Take 3 mLs (2.5 mg total) by nebulization every 6 (six) hours as needed for wheezing or shortness of breath.   albuterol (VENTOLIN HFA) 108 (90 Base) MCG/ACT inhaler Inhale 1-2 puffs into the lungs every 6  (six) hours as needed for wheezing or shortness of breath.   Bacitracin-Polymyxin B (NEOSPORIN EX) Apply 1 Application topically daily as needed (wound Treatment).   bumetanide (BUMEX) 2 MG tablet Take 1 tablet (2 mg total) by mouth daily.   diclofenac Sodium (VOLTAREN) 1 % GEL Apply 1 Application topically at bedtime as needed (pain).   diltiazem (CARDIZEM CD) 180 MG 24 hr capsule TAKE 1 CAPSULE BY MOUTH EVERY DAY (Patient  taking differently: Take 180 mg by mouth every evening.)   empagliflozin (JARDIANCE) 25 MG TABS tablet Take 25 mg by mouth in the morning.   lactulose (CHRONULAC) 10 GM/15ML solution Take 10 g by mouth 3 (three) times daily.   Lancets (ONETOUCH DELICA PLUS LANCET33G) MISC Apply topically 3 (three) times daily.   metolazone (ZAROXOLYN) 5 MG tablet Take 1 tablet (5 mg total) by mouth 2 (two) times a week.   metoprolol succinate (TOPROL-XL) 50 MG 24 hr tablet TAKE 3 TABLETS ONCE A DAY Take with or immediately following a meal.   Multiple Vitamins-Minerals (PRESERVISION AREDS 2) CAPS Take 1 capsule by mouth 2 (two) times daily.   ONETOUCH VERIO test strip 1 each 3 (three) times daily.   potassium chloride (MICRO-K) 10 MEQ CR capsule Take 40 mEq by mouth 2 (two) times daily.   Propylene Glycol (SYSTANE BALANCE) 0.6 % SOLN Place 1 drop into both eyes 2 (two) times daily.   Semaglutide, 2 MG/DOSE, (OZEMPIC, 2 MG/DOSE,) 8 MG/3ML SOPN Inject 2 mg into the skin once a week. (Patient taking differently: Inject 2 mg into the skin every Thursday.)   XIFAXAN 550 MG TABS tablet Take 550 mg by mouth 2 (two) times daily.     Review of Systems      All other systems reviewed and are otherwise negative except as noted above.  Physical Exam    VS:  BP 106/60   Pulse 94   Ht  (1.702 m)   Wt (!) 332 lb (150.6 kg)   BMI 52.00 kg/m  , BMI Body mass index is 52 kg/m.  Wt Readings from Last 3 Encounters:  01/12/23 (!) 332 lb (150.6 kg)  12/29/22 (!) 338 lb 9.6 oz (153.6 kg)   12/23/22 (!) 329 lb (149.2 kg)    GEN: Well nourished, overweight, well developed, in no acute distress. HEENT: normal. Neck: Supple, no JVD, carotid bruits, or masses. Cardiac: IRIR, gr 2/6 systolic murmur, no  rubs or gallops. No clubbing, cyanosis, edema.  Radials/PT 2+ and equal bilaterally.  Respiratory:  Respirations regular and unlabored, clear to auscultation bilaterally. GI: Soft, nontender, nondistended. MS: No deformity or atrophy. Skin: Warm and dry, no rash. Neuro:  Strength and sensation are intact. Psych: Normal affect.  Assessment & Plan    Chronic diastolic heart failure -Low sodium diet, fluid restriction <2L, and daily weights encouraged.Due to elevated right atrial pressure 23 mmHg during TIPS 12/23/22 had to be canceled. Weight down 6 lbs. Weight today 332 lbs with approx dry weight on our scale 324 lbs. . Continue metolazone 5 mg twice per week and Bumex 2 mg daily.  Update BMP. Reiterated need for fluid restriction, regular use of diuretic.  Discussed with Dr. Elby Showers post visit - plan for echo prior to reattempt at TIPS. He will re-evaluate her in his portal hypertension clinic 01/2023. Will discuss timing of echo at her follow up with [place] in one month with Dr. Jerrye Noble input.  Persistent atrial fibrillation - Previously intolerant to digoxin. No anticoagulation due to high bleed risk and cirrhosis. Not candidate for Watchman. Continue Toprol  QD and diltiazem 180 daily.   Hypokalemia - Presently taking potassium 3 tabs AM, 2 tabs afternoon, 2 tabs PM. Update BMP today. Spironoalctone previously stopped due to hypotension.  OSA - CPAP compliance encouraged.   Thrombocytopenia - Follows with Dr. Clelia Croft of hematology.   Morbid obesity - Weight loss via diet and exercise encouraged. Discussed the impact being overweight would  have on cardiovascular risk. On Ozempic per endocrinology. Previously declined referral to PREP exercise program.   DM2 - Continue to  follow with PCP. Continue Jardiance for cardioprotective and heart failure benefit.   Liver cirrhosis secondary to nonalcoholic steato hepatitis-  Continue to follow with GI and PCP.   Disposition: Follow up in 4 weeks with Dr. Duke Salvia or Alver Sorrow, NP   Signed, Alver Sorrow, NP 01/12/2023, 1:52 PM [facility]

## 2023-01-13 LAB — BASIC METABOLIC PANEL
BUN/Creatinine Ratio: 19 (ref 12–28)
BUN: 19 mg/dL (ref 8–27)
CO2: 21 mmol/L (ref 20–29)
Calcium: 9.1 mg/dL (ref 8.7–10.3)
Chloride: 103 mmol/L (ref 96–106)
Creatinine, Ser: 1 mg/dL (ref 0.57–1.00)
Glucose: 129 mg/dL — ABNORMAL HIGH (ref 70–99)
Potassium: 3.5 mmol/L (ref 3.5–5.2)
Sodium: 143 mmol/L (ref 134–144)
eGFR: 62 mL/min/{1.73_m2} (ref >59)

## 2023-01-19 ENCOUNTER — Encounter (HOSPITAL_BASED_OUTPATIENT_CLINIC_OR_DEPARTMENT_OTHER): Payer: Self-pay

## 2023-01-20 NOTE — Telephone Encounter (Signed)
Pt. Following up  

## 2023-01-20 NOTE — Telephone Encounter (Signed)
Patient update

## 2023-01-26 DIAGNOSIS — E1159 Type 2 diabetes mellitus with other circulatory complications: Secondary | ICD-10-CM | POA: Diagnosis not present

## 2023-01-26 DIAGNOSIS — E2839 Other primary ovarian failure: Secondary | ICD-10-CM | POA: Diagnosis not present

## 2023-01-26 DIAGNOSIS — Z23 Encounter for immunization: Secondary | ICD-10-CM | POA: Diagnosis not present

## 2023-01-26 DIAGNOSIS — Z6841 Body Mass Index (BMI) 40.0 and over, adult: Secondary | ICD-10-CM | POA: Diagnosis not present

## 2023-01-26 DIAGNOSIS — Z0001 Encounter for general adult medical examination with abnormal findings: Secondary | ICD-10-CM | POA: Diagnosis not present

## 2023-01-26 DIAGNOSIS — K7460 Unspecified cirrhosis of liver: Secondary | ICD-10-CM | POA: Diagnosis not present

## 2023-01-26 DIAGNOSIS — E113212 Type 2 diabetes mellitus with mild nonproliferative diabetic retinopathy with macular edema, left eye: Secondary | ICD-10-CM | POA: Diagnosis not present

## 2023-01-26 DIAGNOSIS — E119 Type 2 diabetes mellitus without complications: Secondary | ICD-10-CM | POA: Diagnosis not present

## 2023-01-26 DIAGNOSIS — I509 Heart failure, unspecified: Secondary | ICD-10-CM | POA: Diagnosis not present

## 2023-01-27 DIAGNOSIS — E119 Type 2 diabetes mellitus without complications: Secondary | ICD-10-CM | POA: Diagnosis not present

## 2023-01-27 DIAGNOSIS — I10 Essential (primary) hypertension: Secondary | ICD-10-CM | POA: Diagnosis not present

## 2023-01-27 DIAGNOSIS — D696 Thrombocytopenia, unspecified: Secondary | ICD-10-CM | POA: Diagnosis not present

## 2023-01-27 DIAGNOSIS — E876 Hypokalemia: Secondary | ICD-10-CM | POA: Diagnosis not present

## 2023-01-27 DIAGNOSIS — K7460 Unspecified cirrhosis of liver: Secondary | ICD-10-CM | POA: Diagnosis not present

## 2023-01-30 DIAGNOSIS — I10 Essential (primary) hypertension: Secondary | ICD-10-CM | POA: Diagnosis not present

## 2023-01-30 DIAGNOSIS — E876 Hypokalemia: Secondary | ICD-10-CM | POA: Diagnosis not present

## 2023-01-30 NOTE — Telephone Encounter (Signed)
Weight log update

## 2023-01-31 ENCOUNTER — Telehealth (HOSPITAL_BASED_OUTPATIENT_CLINIC_OR_DEPARTMENT_OTHER): Payer: Self-pay | Admitting: Cardiovascular Disease

## 2023-01-31 ENCOUNTER — Other Ambulatory Visit: Payer: Self-pay | Admitting: Family Medicine

## 2023-01-31 ENCOUNTER — Ambulatory Visit
Admission: RE | Admit: 2023-01-31 | Discharge: 2023-01-31 | Disposition: A | Discharge status: Home / Self Care | Payer: Medicare PPO | Source: Ambulatory Visit | Attending: Student | Admitting: Student

## 2023-01-31 DIAGNOSIS — K7460 Unspecified cirrhosis of liver: Secondary | ICD-10-CM | POA: Diagnosis not present

## 2023-01-31 DIAGNOSIS — K7581 Nonalcoholic steatohepatitis (NASH): Secondary | ICD-10-CM | POA: Diagnosis not present

## 2023-01-31 DIAGNOSIS — E2839 Other primary ovarian failure: Secondary | ICD-10-CM

## 2023-01-31 HISTORY — PX: IR RADIOLOGIST EVAL & MGMT: IMG5224

## 2023-01-31 NOTE — Telephone Encounter (Signed)
Calling with a critical lab results

## 2023-01-31 NOTE — Telephone Encounter (Signed)
Paramedic staff member from [facility]. Name/DOB of patient confirmed. Call report regarding critical lab hypokalemia  Lab dated 01/30/23 with K 2.7  Notes that the provider, Dr. Docia Chuck, is planning to treat with potassium 3 tablets first day, 3 tablets second day, 4 tablets third day. From their eMAR they have she is taking only daily as baseline dose. Our eMAR lists dosing of BID.   Per Enrique Sack at [facility] they are planning to replete potassium and recheck BMP. Encouraged them to reach out to patient to confirm how she is taking potassium. We will also send MyChart message to patient to confirm she has been taking her potassium.   Alver Sorrow, NP

## 2023-01-31 NOTE — Progress Notes (Signed)
Referring Physician(s): Willis Modena, MD  Reason for follow up: The patient is seen in virtual telephone visit today for gastroesophageal varices  History of present illness: Initial HPI from 09/21/22:  Hannah Khan is a 67 y.o. female with history of NASH cirrhosis initially diagnosed in 2009.  She is graciously referred by Dr. Dulce Sellar for gastroesophageal varices visualized on recent endoscopy.  She denies any prior history of hematemesis, hematochezia, or melena.  She has a history of hemorrhoids which were treated successfully.  She has never has a paracentesis.  She reports a single episode of profound hepatic encephalopathy years ago which required hospitalization, and has been compliant with lactulose and rifaximin since then, only experiencing very mild encephalopathy if doses are missed or delayed for various reasons.  She does not drink alcohol and has no significant history of such.  She takes bumex and occasionally another medication when she feels fluid overload.     On 12/23/22 we attempted TIPS creation with transsplenic portal venous recanalization, and variceal embolization however aborted this procedure due to her right atrial pressure measurement of 23 mmHg.  She has since been following up with Cardiology for further evaluation and working to lose weight.    Reports weighing 350 lbs on the day of the procedure.  Now 321.8 lbs, down from 337.2 in early April.  Remains on bumex and metolazone.  Now having some problems with hypokalemia, working on repletion regimen with her multiple physicians.  Following with Dr. Sharl Ma for bariatric management.  She remains without symptoms of GI bleeding including hematemesis or hematochezia.    Past Medical History:  Diagnosis Date   Anemia    hx of   Arthritis    knees   CHF (congestive heart failure) (HCC)    chronic diastolic   Diabetes mellitus without complication (HCC)    type 2   Dyspnea    Dysrhythmia    Fibromyalgia     Gastric varices    large   H/O transfusion of platelets    Heart murmur    never has caused any problems   Hx of colonic polyps    s/p partial colectomy   Hyperlipidemia    Hypertension    Joint pain    in knees and back spasms   Left leg swelling    wear compression hose   Liver cirrhosis secondary to NASH (nonalcoholic steatohepatitis) (HCC) dx nov 2014   had enlarged spleen also    Morbid obesity (HCC)    Persistent atrial fibrillation (HCC)    Pneumonia 10/2016   x 5   PONV (postoperative nausea and vomiting)    in past none recent   Portal hypertension (HCC)    Sleep apnea    uses cpap, setting varies between 14-20   Spleen enlarged    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 [facility])     Past Surgical History:  Procedure Laterality Date   BREAST LUMPECTOMY WITH RADIOACTIVE SEED LOCALIZATION Right 08/29/2017   Procedure: RIGHT BREAST LUMPECTOMY WITH RADIOACTIVE SEEDS LOCALIZATION;  Surgeon: Harriette Bouillon, MD;  Location: [facility] OR;  Service: General;  Laterality: Right;   CESAREAN SECTION  1989   CHOLECYSTECTOMY  20 yrs ago   COLECTOMY  2011   COLONOSCOPY     COLONOSCOPY N/A 09/05/2018   Procedure: COLONOSCOPY;  Surgeon: Willis Modena, MD;  Location: [facility];  Service: Endoscopy;  Laterality: N/A;   COLONOSCOPY WITH PROPOFOL Bilateral 05/04/2022   Procedure: COLONOSCOPY WITH PROPOFOL;  Surgeon: Willis Modena, MD;  Location: [facility];  Service: Gastroenterology;  Laterality: Bilateral;   DILATATION & CURETTAGE/HYSTEROSCOPY WITH MYOSURE N/A 05/06/2016   Procedure: DILATATION & CURETTAGE/HYSTEROSCOPY WITH MYOSURE WITH POLYPECTOMY;  Surgeon: Myna Hidalgo, DO;  Location: [facility];  Service: Gynecology;  Laterality: N/A;   ESOPHAGOGASTRODUODENOSCOPY (EGD) WITH PROPOFOL N/A 09/04/2013   Procedure: ESOPHAGOGASTRODUODENOSCOPY (EGD) WITH PROPOFOL;  Surgeon: Willis Modena, MD;  Location: [facility];  Service: Endoscopy;  Laterality: N/A;   ESOPHAGOGASTRODUODENOSCOPY (EGD)  WITH PROPOFOL Bilateral 05/04/2022   Procedure: ESOPHAGOGASTRODUODENOSCOPY (EGD) WITH PROPOFOL;  Surgeon: Willis Modena, MD;  Location: [facility];  Service: Gastroenterology;  Laterality: Bilateral;   IR INTRAVASCULAR ULTRASOUND NON CORONARY  12/23/2022   IR RADIOLOGIST EVAL & MGMT  09/21/2022   IR RADIOLOGIST EVAL & MGMT  10/06/2022   IR RADIOLOGIST EVAL & MGMT  11/02/2022   IR TIPS  12/23/2022   IR US GUIDE VASC ACCESS RIGHT  12/23/2022   KNEE ARTHROSCOPY  yrs ago   bilateral, one done x 1, one done twice   POLYPECTOMY  09/05/2018   Procedure: POLYPECTOMY;  Surgeon: Willis Modena, MD;  Location: [facility];  Service: Endoscopy;;   RADIOLOGY WITH ANESTHESIA N/A 12/23/2022   Procedure: TIPS;  Surgeon: Bennie Dallas, MD;  Location: [facility];  Service: Radiology;  Laterality: N/A;    Allergies: Celecoxib, Shellfish allergy, Barium-containing compounds, Prednisone, Statins, Fentanyl, Ibuprofen, Tramadol, Tylenol [acetaminophen], and Zetia [ezetimibe]  Medications: Prior to Admission medications   Medication Sig Start Date End Date Taking? Authorizing Provider  albuterol (PROVENTIL) (2.5 MG/3ML) 0.083% nebulizer solution Take 3 mLs (2.5 mg total) by nebulization every 6 (six) hours as needed for wheezing or shortness of breath. 03/22/21   Noralee Stain, DO  albuterol (VENTOLIN HFA) 108 (90 Base) MCG/ACT inhaler Inhale 1-2 puffs into the lungs every 6 (six) hours as needed for wheezing or shortness of breath.    [provider]  Bacitracin-Polymyxin B (NEOSPORIN EX) Apply 1 Application topically daily as needed (wound Treatment).    [provider]  bumetanide (BUMEX) 2 MG tablet Take 1 tablet (2 mg total) by mouth daily. 12/29/22   Alver Sorrow, NP  diclofenac Sodium (VOLTAREN) 1 % GEL Apply 1 Application topically at bedtime as needed (pain).    [provider]  diltiazem (CARDIZEM CD) 180 MG 24 hr capsule TAKE 1 CAPSULE BY MOUTH EVERY DAY Patient taking differently:  Take 180 mg by mouth every evening. 10/20/22   Alver Sorrow, NP  empagliflozin (JARDIANCE) 25 MG TABS tablet Take 25 mg by mouth in the morning.    [provider]  lactulose (CHRONULAC) 10 GM/15ML solution Take 10 g by mouth 3 (three) times daily. 03/17/21   [provider]  Lancets (ONETOUCH DELICA PLUS [place]) MISC Apply topically 3 (three) times daily. 04/10/20   [provider]  metolazone (ZAROXOLYN) 5 MG tablet Take 1 tablet (5 mg total) by mouth 2 (two) times a week. 12/29/22   Alver Sorrow, NP  metoprolol succinate (TOPROL-XL) 50 MG 24 hr tablet TAKE 3 TABLETS ONCE A DAY Take with or immediately following a meal. 07/14/22   Chilton Si, MD  Multiple Vitamins-Minerals (PRESERVISION AREDS 2) CAPS Take 1 capsule by mouth 2 (two) times daily.    [provider]  [facility] VERIO test strip 1 each 3 (three) times daily. 04/13/20   [provider]  potassium chloride (MICRO-K) 10 MEQ CR capsule Take 40 mEq by mouth 2 (two) times daily.    [provider]  Propylene Glycol (SYSTANE BALANCE) 0.6 % SOLN Place 1 drop into both eyes 2 (two) times daily.    [provider]  Semaglutide, 2 MG/DOSE, (OZEMPIC, 2 MG/DOSE,) 8 MG/3ML SOPN Inject 2 mg into the skin once a week. Patient taking differently: Inject 2 mg into the skin every Thursday. 11/03/22   Alver Sorrow, NP  XIFAXAN 550 MG TABS tablet Take 550 mg by mouth 2 (two) times daily. 03/13/21   [provider]     Family History  Problem Relation Age of Onset   Heart failure Mother    Cancer Mother    Hyperlipidemia Mother    Congestive Heart Failure Mother    Stroke Mother    Lung cancer Father    Cancer Father    Cancer Brother    Hyperlipidemia Brother    Stroke Other     Social History   Socioeconomic History   Marital status: Married    Spouse name: Not on file   Number of children: Not on file   Years of education: Not on file   Highest  education level: Not on file  Occupational History   Occupation: Scientist, product/process development  Tobacco Use   Smoking status: Former    Packs/day: 1.00    Years: 3.00    Additional pack years: 0.00    Total pack years: 3.00    Types: Cigarettes    Quit date: 09/19/1974    Years since quitting: 48.4   Smokeless tobacco: Never  Vaping Use   Vaping Use: Never used  Substance and Sexual Activity   Alcohol use: Not Currently    Comment: occ   Drug use: No   Sexual activity: Not on file  Other Topics Concern   Not on file  Social History Narrative   Lives in [place] with her husband.  Instructor for early childhood development.     Social Determinants of Health   Financial Resource Strain: Not on file  Food Insecurity: No Food Insecurity (11/13/2022)   Hunger Vital Sign    Worried About Running Out of Food in the Last Year: Never true    Ran Out of Food in the Last Year: Never true  Transportation Needs: No Transportation Needs (11/13/2022)   PRAPARE - Administrator, Civil Service (Medical): No    Lack of Transportation (Non-Medical): No  Physical Activity: Not on file  Stress: Not on file  Social Connections: Not on file     Vital Signs: There were no vitals taken for this visit.  No physical examination was performed in lieu of virtual telephone clinic visit.  Imaging:  Chronic occlusion of the main portal vein with a few periportal collaterals.    Large esophageal varices    Large gastric varices    Large SVM --> IVC ectopic varix shunt    Large gastrorenal shunt   Echocardiogram: IMPRESSIONS   1. Left ventricular ejection fraction, by estimation, is 60 to 65%. The  left ventricle has normal function. The left ventricle has no regional  wall motion abnormalities. Left ventricular diastolic function could not  be evaluated.   2. Right ventricular systolic function is normal. The right ventricular  size is normal. There is normal pulmonary artery systolic  pressure.   3. Left atrial size was mildly dilated.   4. The mitral valve is normal in structure. Trivial mitral valve  regurgitation. No evidence of mitral stenosis.   5. The aortic valve was not well visualized. Aortic  valve regurgitation  is not visualized. No aortic stenosis is present.   6. The inferior vena cava is normal in size with greater than 50%  respiratory variability, suggesting right atrial pressure of 3 mmHg.   Labs:  CBC: Recent Labs    11/14/22 1540 11/15/22 0156 12/12/22 0942 12/23/22 0735 12/23/22 0911  WBC 4.4 3.6* 5.6 4.6  --   HGB 12.8 11.8* 14.5 13.0 11.6*  HCT 38.4 34.1* 42.2 40.5 34.0*  PLT 26* 23* Comment 44*  --     COAGS: Recent Labs    11/15/22 0156 12/12/22 0942 12/23/22 0735  INR 1.4* 1.2 1.2    BMP: Recent Labs    11/13/22 0147 11/14/22 0139 11/15/22 0156 12/12/22 0942 12/23/22 0817 12/23/22 0911 01/12/23 1443  NA 137 137 135 139 137 142 143  K 3.8 3.1* 3.5 3.1* 3.4* 3.3* 3.5  CL 106 107 102 98 104 106 103  CO2 23 23 26 25 22   --  21  GLUCOSE 137* 126* 126* 135* 116* 107* 129*  BUN 19 18 19 14 14 14 19   CALCIUM 8.7* 8.3* 8.3* 9.7 8.4*  --  9.1  CREATININE 0.93 0.87 0.79 1.01* 0.85 0.80 1.00  GFRNONAA >60 >60 >60  --  >60  --   --     LIVER FUNCTION TESTS: Recent Labs    11/14/22 0139 11/15/22 0156 12/12/22 0942 12/23/22 0817  BILITOT 3.9* 3.9* 5.2* 2.9*  AST 32 33 37 30  ALT 18 19 21 20   ALKPHOS 61 62 115 77  PROT 5.4* 5.5* 6.4 6.1*  ALBUMIN 2.5* 2.4* 3.5* 2.7*     From Oct 2023 labs: Child Pugh B (8 points) MELD 15 (6.0% estimated 3 month mortality) FIPS = 0.78 (Overall survival predicted at 1 month 90.5%, 3 months 71.1%, and 6 months 59.7%)  Assessment and Plan: 67 year old female with history of NASH cirrhosis (Child Pugh B, MELD 15), portal hypertension, and gastroesophageal varices without history of hemorrhage. Recent CT demonstrates multiple portosystemic shunts including gastrorenal and an ectopic  SMV-->IVC varix, and confirmed suspicion of main portal vein chronic thrombus seen on 2021 CT.  She has very large gastroesophageal varices.  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   We attempted TIPS creation with transsplenic portal venous recanalization, and variceal embolization on 12/23/22 however aborted this procedure due to her right atrial pressure measurement of 23 mmHg.   She has successfully lost nearly 30 lbs since the procedure attempt.  I am hopeful with fluid management and continued weight loss we will be able to safely proceed with the procedures above in the future.  I believe that its worth obtaining a repeat echocardiogram now (I will contact Gillian Shields to see if she can arrange this in her office) to assess for any changes.  We will plan to follow up in IR clinic in 1 month, once complete.   Marliss Coots, MD Pager: [phone] Clinic: [phone]    I spent a total of 25 Minutes in virtual telephone clinical consultation, greater than 50% of which was counseling/coordinating care for portal hypertension.

## 2023-02-02 ENCOUNTER — Telehealth (HOSPITAL_BASED_OUTPATIENT_CLINIC_OR_DEPARTMENT_OTHER): Payer: Self-pay | Admitting: Family

## 2023-02-02 DIAGNOSIS — I5032 Chronic diastolic (congestive) heart failure: Secondary | ICD-10-CM

## 2023-02-02 NOTE — Telephone Encounter (Signed)
Have been following Hannah Khan for management of volume status in anticipation of rescheduling TIPS which was cancelled due to RA pressure measurement of 23mm Hg.  Volume status is improved and weight approaching dry weight.  Discussed with Dr. Elby Showers who requested updated echocardiogram.  Echocardiogram ordered and will ask scheduling team to reach out to her and schedule.   Hannah Sorrow, NP

## 2023-02-09 DIAGNOSIS — M5413 Radiculopathy, cervicothoracic region: Secondary | ICD-10-CM | POA: Diagnosis not present

## 2023-02-09 DIAGNOSIS — M25511 Pain in right shoulder: Secondary | ICD-10-CM | POA: Diagnosis not present

## 2023-02-09 DIAGNOSIS — M9901 Segmental and somatic dysfunction of cervical region: Secondary | ICD-10-CM | POA: Diagnosis not present

## 2023-02-09 DIAGNOSIS — M9903 Segmental and somatic dysfunction of lumbar region: Secondary | ICD-10-CM | POA: Diagnosis not present

## 2023-02-09 DIAGNOSIS — M9902 Segmental and somatic dysfunction of thoracic region: Secondary | ICD-10-CM | POA: Diagnosis not present

## 2023-02-09 DIAGNOSIS — M25552 Pain in left hip: Secondary | ICD-10-CM | POA: Diagnosis not present

## 2023-02-09 DIAGNOSIS — M62830 Muscle spasm of back: Secondary | ICD-10-CM | POA: Diagnosis not present

## 2023-02-10 DIAGNOSIS — E876 Hypokalemia: Secondary | ICD-10-CM | POA: Diagnosis not present

## 2023-02-14 ENCOUNTER — Encounter (HOSPITAL_BASED_OUTPATIENT_CLINIC_OR_DEPARTMENT_OTHER): Payer: Self-pay | Admitting: Family

## 2023-02-14 ENCOUNTER — Ambulatory Visit (HOSPITAL_BASED_OUTPATIENT_CLINIC_OR_DEPARTMENT_OTHER): Payer: Medicare PPO | Admitting: Family

## 2023-02-14 VITALS — BP 84/46 | HR 98 | Ht 67.0 in | Wt 324.0 lb

## 2023-02-14 DIAGNOSIS — K7581 Nonalcoholic steatohepatitis (NASH): Secondary | ICD-10-CM

## 2023-02-14 DIAGNOSIS — I4821 Permanent atrial fibrillation: Secondary | ICD-10-CM

## 2023-02-14 DIAGNOSIS — I5032 Chronic diastolic (congestive) heart failure: Secondary | ICD-10-CM | POA: Diagnosis not present

## 2023-02-14 DIAGNOSIS — D696 Thrombocytopenia, unspecified: Secondary | ICD-10-CM | POA: Diagnosis not present

## 2023-02-14 DIAGNOSIS — E876 Hypokalemia: Secondary | ICD-10-CM | POA: Diagnosis not present

## 2023-02-14 DIAGNOSIS — K7460 Unspecified cirrhosis of liver: Secondary | ICD-10-CM | POA: Diagnosis not present

## 2023-02-14 DIAGNOSIS — G4733 Obstructive sleep apnea (adult) (pediatric): Secondary | ICD-10-CM | POA: Diagnosis not present

## 2023-02-14 MED ORDER — POTASSIUM CHLORIDE ER 10 MEQ PO CPCR
ORAL_CAPSULE | ORAL | 5 refills | Status: DC
Start: 2023-02-14 — End: 2023-02-28

## 2023-02-14 NOTE — Progress Notes (Addendum)
Office Visit    Patient Name: Hannah Khan Date of Encounter: 02/14/2023  PCP:  Darrow Bussing, MD   [facility]  Cardiologist:  Chilton Si, MD  Advanced Practice Provider:  No care team member to display Electrophysiologist:  None   Chief Complaint    Hannah Khan is a 67 y.o. female presents today for follow-up of diastolic heart failure  Past Medical History    Past Medical History:  Diagnosis Date   Anemia    hx of   Arthritis    knees   CHF (congestive heart failure) (HCC)    chronic diastolic   Diabetes mellitus without complication (HCC)    type 2   Dyspnea    Dysrhythmia    Fibromyalgia    Gastric varices    large   H/O transfusion of platelets    Heart murmur    never has caused any problems   Hx of colonic polyps    s/p partial colectomy   Hyperlipidemia    Hypertension    Joint pain    in knees and back spasms   Left leg swelling    wear compression hose   Liver cirrhosis secondary to NASH (nonalcoholic steatohepatitis) (HCC) dx nov 2014   had enlarged spleen also    Morbid obesity (HCC)    Persistent atrial fibrillation (HCC)    Pneumonia 10/2016   x 5   PONV (postoperative nausea and vomiting)    in past none recent   Portal hypertension (HCC)    Sleep apnea    uses cpap, setting varies between 14-20   Spleen enlarged    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 [facility])    Past Surgical History:  Procedure Laterality Date   BREAST LUMPECTOMY WITH RADIOACTIVE SEED LOCALIZATION Right 08/29/2017   Procedure: RIGHT BREAST LUMPECTOMY WITH RADIOACTIVE SEEDS LOCALIZATION;  Surgeon: Harriette Bouillon, MD;  Location: [facility];  Service: General;  Laterality: Right;   CESAREAN SECTION  1989   CHOLECYSTECTOMY  20 yrs ago   COLECTOMY  2011   COLONOSCOPY     COLONOSCOPY N/A 09/05/2018   Procedure: COLONOSCOPY;  Surgeon: Willis Modena, MD;  Location: [facility];  Service: Endoscopy;  Laterality: N/A;   COLONOSCOPY WITH  PROPOFOL Bilateral 05/04/2022   Procedure: COLONOSCOPY WITH PROPOFOL;  Surgeon: Willis Modena, MD;  Location: [facility];  Service: Gastroenterology;  Laterality: Bilateral;   DILATATION & CURETTAGE/HYSTEROSCOPY WITH MYOSURE N/A 05/06/2016   Procedure: DILATATION & CURETTAGE/HYSTEROSCOPY WITH MYOSURE WITH POLYPECTOMY;  Surgeon: Myna Hidalgo, DO;  Location: [facility];  Service: Gynecology;  Laterality: N/A;   ESOPHAGOGASTRODUODENOSCOPY (EGD) WITH PROPOFOL N/A 09/04/2013   Procedure: ESOPHAGOGASTRODUODENOSCOPY (EGD) WITH PROPOFOL;  Surgeon: Willis Modena, MD;  Location: [facility];  Service: Endoscopy;  Laterality: N/A;   ESOPHAGOGASTRODUODENOSCOPY (EGD) WITH PROPOFOL Bilateral 05/04/2022   Procedure: ESOPHAGOGASTRODUODENOSCOPY (EGD) WITH PROPOFOL;  Surgeon: Willis Modena, MD;  Location: [facility];  Service: Gastroenterology;  Laterality: Bilateral;   IR INTRAVASCULAR ULTRASOUND NON CORONARY  12/23/2022   IR RADIOLOGIST EVAL & MGMT  09/21/2022   IR RADIOLOGIST EVAL & MGMT  10/06/2022   IR RADIOLOGIST EVAL & MGMT  11/02/2022   IR RADIOLOGIST EVAL & MGMT  01/31/2023   IR TIPS  12/23/2022   IR US GUIDE VASC ACCESS RIGHT  12/23/2022   KNEE ARTHROSCOPY  yrs ago   bilateral, one done x 1, one done twice   POLYPECTOMY  09/05/2018   Procedure: POLYPECTOMY;  Surgeon: Willis Modena, MD;  Location:  [facility];  Service: Endoscopy;;   RADIOLOGY WITH ANESTHESIA N/A 12/23/2022   Procedure: TIPS;  Surgeon: Bennie Dallas, MD;  Location: [facility];  Service: Radiology;  Laterality: N/A;    Allergies  Allergies  Allergen Reactions   Celecoxib Other (See Comments)    "speech slurred" with celebrex   Shellfish Allergy Swelling and Rash    TINGLING AND SWELLING OF LIPS. LARGE WHELPS QUARTER-SIZE   Barium-Containing Compounds Other (See Comments)    TACHYCARDIA   Prednisone Other (See Comments)    TACHYCARDIA   Statins Other (See Comments)    AGGRAVATED FIBROMYALGIA   Fentanyl     Hallucinations     Ibuprofen Other (See Comments)    UNSPECIFIED SPECIFIC REACTION >> "DUE TO PLATELETS"   Tramadol     Hallucination    Tylenol [Acetaminophen] Other (See Comments)    UNSPECIFIED SPECIFIC REACTION >> "DUE TO PLATELETS"   Zetia [Ezetimibe]     Headache    History of Present Illness    Hannah Khan is a 67 y.o. female with a hx of chronic diastolic heart failure, morbid obesity, OSA, NASH/cirrhosis, chronic thrombocytopenia, DM2, vasovagal presyncope, chronic atrial fibrillation not on anticoagulation last seen 12/29/2022  ED visit 02/2020 with atrial fibrillation requiring diltiazem gtt. At clinic follow up July 2021 noted dyspnea and fluid retention, Bumex increased for short time. At follow up 04/2020 weight and edema were improved.  She saw Dr. Ladona Ridgel 07/2020 and decided upon medical management as she was overall asymptomatic.   Admitted 01/2021 with atrial fibrillation and hepatic encephalopathy.  Admitted 03/17/21-03/22/21 with fever and hypoxia treated for CAP. 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 Readmitted 03/2021 with acute on chronic diastolic heart failure suspected due to dietary indiscretion. Marcelline Deist was started. Bumex 2mg  daily continued with Metolazone PRN for weight gain. She did have episode of vasovagal presyncope associated with IV magnesium with CT head and neurology consult unremarkable. She saw Dr. Duke Salvia 06/14/21 - Diltaizem reduced due to hypotension and Metoprolol increased to 75mg  BID. Spironolactone previously trialed but had to be discontinued due to hypotension.  Her diltiazem has been gradually reduced due to hypotension.  Bumex and metolazone have been escalated.    Admitted 2/25-2/27/24 with cellulitis and sepsis treated with antibiotics. Her 11/21/22 TIPS was cancelled due to car trouble.  Seen 12/12/2022.  At that time she noted morning BP as low as 80s over 50s associate with nausea.  Subsequent ammonia levels unremarkable.   She was often self adjusting her Bumex.  She was encouraged to take Bumex, metolazone as prescribed.   She was to undergo TIPS 12/23/2022 but procedure had to be aborted due to right atrial pressure of 23 mmHg.   Seen 12/29/2022.  At that time she noted weight had gone up to 381 pounds today after TIPS procedure and at clinic visit 12/29/2022 was 338 pounds.  She continued to self adjust her diuretics to urinary frequency.  She was recommended for metolazone 5 mg twice per week and Bumex 2 mg daily.  At visit 01/12/2023 weight was down 6 pounds.  Metolazone twice per week and Bumex 2 mg daily were continued.  Notes today for follow-up independently.  Weight today down an additional 8 pounds over 1 month.  Weighing daily at home with most recent home weight 321.4 pounds.  Weight has been somewhat labile at home over the last month ranging 321.4-337.2.  She has been taking metolazone 2-3 times  per week pending her weight.  She has been following with PCP for persistent hypokalemia (02/04/23 Magnesium 2, K 3.1 ? 02/10/23 K 3.1, creatinine 1.15, GFR 52). HR at home 133 bpm, 99 bpm. Has been more often 101-102 bpm. BP at home 70-80s/40s associated with lightheadedness, dizziness but no near-syncope, syncope.  EKGs/Labs/Other Studies Reviewed:   The following studies were reviewed today:   Echocardiogram 03/20/2021:  1. Technically difficult; normal LV function; enlarged right heart.   2. Left ventricular ejection fraction, by estimation, is 60 to 65%. The  left ventricle has normal function. The left ventricle has no regional  wall motion abnormalities. There is mild left ventricular hypertrophy.  Left ventricular diastolic parameters  are indeterminate.   3. Right ventricular systolic function is normal. The right ventricular  size is moderately enlarged. There is normal pulmonary artery systolic  pressure.   4. Left atrial size was mildly dilated.   5. Right atrial size was severely dilated.   6. Large  pleural effusion.   7. The mitral valve is normal in structure. Trivial mitral valve  regurgitation. No evidence of mitral stenosis.   8. The aortic valve is calcified. Aortic valve regurgitation is not  visualized. No aortic stenosis is present.   9. Aortic dilatation noted. There is borderline dilatation of the  ascending aorta, measuring 38 mm.  10. The inferior vena cava is dilated in size with >50% respiratory  variability, suggesting right atrial pressure of 8 mmHg.  EKG:  EKG is not ordered today.   Recent Labs: 11/12/2022: B Natriuretic Peptide 434.3 11/15/2022: Magnesium 1.8 12/23/2022: ALT 20; Hemoglobin 11.6; Platelets 44 01/12/2023: BUN 19; Creatinine, Ser 1.00; Potassium 3.5; Sodium 143  Recent Lipid Panel    Component Value Date/Time   CHOL 167 07/14/2022 0943   TRIG 77 07/14/2022 0943   HDL 84 07/14/2022 0943   CHOLHDL 2.0 07/14/2022 0943   CHOLHDL 3.0 07/02/2014 1138   VLDL 19 07/02/2014 1138   LDLCALC 69 07/14/2022 0943    Home Medications   Current Meds  Medication Sig   albuterol (VENTOLIN HFA) 108 (90 Base) MCG/ACT inhaler Inhale 1-2 puffs into the lungs every 6 (six) hours as needed for wheezing or shortness of breath.   Bacitracin-Polymyxin B (NEOSPORIN EX) Apply 1 Application topically daily as needed (wound Treatment).   bumetanide (BUMEX) 2 MG tablet Take 1 tablet (2 mg total) by mouth daily.   diclofenac Sodium (VOLTAREN) 1 % GEL Apply 1 Application topically at bedtime as needed (pain).   diltiazem (CARDIZEM CD) 180 MG 24 hr capsule TAKE 1 CAPSULE BY MOUTH EVERY DAY (Patient taking differently: Take 180 mg by mouth every evening.)   empagliflozin (JARDIANCE) 25 MG TABS tablet Take 25 mg by mouth in the morning.   lactulose (CHRONULAC) 10 GM/15ML solution Take 10 g by mouth 3 (three) times daily.   Lancets (ONETOUCH DELICA PLUS LANCET33G) MISC Apply topically 3 (three) times daily.   metolazone (ZAROXOLYN) 5 MG tablet Take 1 tablet (5 mg total) by mouth 2  (two) times a week.   metoprolol succinate (TOPROL-XL) 50 MG 24 hr tablet TAKE 3 TABLETS ONCE A DAY Take with or immediately following a meal.   Multiple Vitamins-Minerals (PRESERVISION AREDS 2) CAPS Take 1 capsule by mouth 2 (two) times daily.   ONETOUCH VERIO test strip 1 each 3 (three) times daily.   potassium chloride (MICRO-K) 10 MEQ CR capsule Take 40 mEq by mouth in the morning, 20 mEq at noon, and 40 mEq  at bedtime   Propylene Glycol (SYSTANE BALANCE) 0.6 % SOLN Place 1 drop into both eyes 2 (two) times daily.   Semaglutide, 2 MG/DOSE, (OZEMPIC, 2 MG/DOSE,) 8 MG/3ML SOPN Inject 2 mg into the skin once a week. (Patient taking differently: Inject 2 mg into the skin every Thursday.)   XIFAXAN 550 MG TABS tablet Take 550 mg by mouth 2 (two) times daily.     Review of Systems      All other systems reviewed and are otherwise negative except as noted above.  Physical Exam    VS:  BP (!) 84/46   Pulse 98   Ht 5\' 7"  (1.702 m)   Wt (!) 324 lb (147 kg)   BMI 50.75 kg/m  , BMI Body mass index is 50.75 kg/m.  Wt Readings from Last 3 Encounters:  02/14/23 (!) 324 lb (147 kg)  01/12/23 (!) 332 lb (150.6 kg)  12/29/22 (!) 338 lb 9.6 oz (153.6 kg)    GEN: Well nourished, overweight, well developed, in no acute distress. HEENT: normal. Neck: Supple, no JVD, carotid bruits, or masses. Cardiac: IRIR, gr 2/6 systolic murmur, no  rubs or gallops. No clubbing, cyanosis, edema.  Radials/PT 2+ and equal bilaterally.  Respiratory:  Respirations regular and unlabored, clear to auscultation bilaterally. GI: Soft, nontender, nondistended. MS: No deformity or atrophy. Skin: Warm and dry, no rash. Neuro:  Strength and sensation are intact. Psych: Normal affect.  Assessment & Plan    Chronic diastolic heart failure -Low sodium diet, fluid restriction <2L, and daily weights encouraged.Due to elevated right atrial pressure 23 mmHg during TIPS 12/23/22 had to be canceled.  Reiterated need for fluid  restriction, regular use of diuretic.  Weight in clinic 324 pounds which is her lowest weight in 1 year.  Echocardiogram upcoming 03/07/2023 to reassess RA pressure before rescheduling TIPS with Dr. Elby Showers.  Due to hypotension, lightheadedness reduce metolazone to once per week with additional dose as needed for weight gain of 3 pounds overnight or 5 pounds in 1 week.  Continue Bumex 2 mg daily.   Persistent atrial fibrillation - Previously intolerant to digoxin. No anticoagulation due to high bleed risk and cirrhosis. Not candidate for Watchman. Continue Toprol 150mg  QD and diltiazem 180 daily.   Hypokalemia - Presently taking potassium 4 tabs AM, 2 tabs afternoon, 4 tabs PM at the direction of primary care. Labs 01/31/23 and 02/10/23 K 3.1.  01/31/2023 normal magnesium.  Reducing metolazone, as above which will hopefully help to replete potassium.  Previously did not tolerate spironolactone due to hypotension but could be considered in the future.  Will defer today as BP is 84/46.  BMP in 1 week.  OSA - CPAP compliance encouraged.   Thrombocytopenia - Follows with Dr. Clelia Croft of hematology.   Morbid obesity - Weight loss via diet and exercise encouraged. Discussed the impact being overweight would have on cardiovascular risk. On Ozempic per endocrinology. Previously declined referral to PREP exercise program.   DM2 - Continue to follow with PCP. Continue Jardiance for cardioprotective and heart failure benefit.  Notes blood sugar has been higher than normal more recently 140-160.  She is not sure if it is up as she was doing more fruit to try to raise her potassium.   Liver cirrhosis secondary to nonalcoholic steato hepatitis-  Continue to follow with GI and PCP.   Disposition: Follow up in 2 months with Dr. Duke Salvia or Alver Sorrow, NP   Signed, Alver Sorrow, NP 02/14/2023,  2:00 PM [facility]

## 2023-02-14 NOTE — Patient Instructions (Addendum)
Medication Instructions:  Your physician has recommended you make the following change in your medication:    REDUCE Metolazone to once per week  May take additional tablet for 3 lbs overnight or 5 lbs in one week Do not exceed 2 tablets per week  This will hopefully help to improve your potassium level  *If you need a refill on your cardiac medications before your next appointment, please call your pharmacy*  Labs Your physician recommends that you return for lab work in 1-2 weeks for potassium  Please return for Lab work. You may come to the...   [facility] (3rd floor) [address], [place], [place] [address]  Open: 8am-Noon and 1pm-4:30pm  Please ring the doorbell on the small table when you exit the elevator and the Lab Tech will come get you  [facility] [address], [place], [place] [address] Open: 8am-1pm, then 2pm-4:30pm   [facility]- Please see attached locations sheet stapled to your lab work with address and hours.    Testing/Procedures: Your echocardiogram is scheduling   Follow-Up: At [facility], you and your health needs are our priority.  As part of our continuing mission to provide you with exceptional heart care, we have created designated Provider Care Teams.  These Care Teams include your primary Cardiologist (physician) and Advanced Practice Providers (APPs -  Physician Assistants and Nurse Practitioners) who all work together to provide you with the care you need, when you need it.  We recommend signing up for the patient portal called "MyChart".  Sign up information is provided on this After Visit Summary.  MyChart is used to connect with patients for Virtual Visits (Telemedicine).  Patients are able to view lab/test results, encounter notes, upcoming appointments, etc.  Non-urgent messages can be sent to your provider as well.   To learn more about what you can do with MyChart, go to  [email or web address].    Your next appointment:   2 month(s)  Provider:   Chilton Si, MD or Gillian Shields, NP

## 2023-02-18 DIAGNOSIS — I350 Nonrheumatic aortic (valve) stenosis: Secondary | ICD-10-CM

## 2023-02-18 HISTORY — DX: Nonrheumatic aortic (valve) stenosis: I35.0

## 2023-02-22 ENCOUNTER — Other Ambulatory Visit: Payer: Self-pay | Admitting: Family Medicine

## 2023-02-22 DIAGNOSIS — Z853 Personal history of malignant neoplasm of breast: Secondary | ICD-10-CM

## 2023-02-22 DIAGNOSIS — Z1231 Encounter for screening mammogram for malignant neoplasm of breast: Secondary | ICD-10-CM

## 2023-02-27 ENCOUNTER — Telehealth: Payer: Self-pay | Admitting: Family

## 2023-02-27 DIAGNOSIS — K7581 Nonalcoholic steatohepatitis (NASH): Secondary | ICD-10-CM | POA: Diagnosis not present

## 2023-02-27 DIAGNOSIS — E876 Hypokalemia: Secondary | ICD-10-CM | POA: Diagnosis not present

## 2023-02-27 DIAGNOSIS — G4733 Obstructive sleep apnea (adult) (pediatric): Secondary | ICD-10-CM | POA: Diagnosis not present

## 2023-02-27 DIAGNOSIS — K7460 Unspecified cirrhosis of liver: Secondary | ICD-10-CM | POA: Diagnosis not present

## 2023-02-27 DIAGNOSIS — I4821 Permanent atrial fibrillation: Secondary | ICD-10-CM | POA: Diagnosis not present

## 2023-02-27 DIAGNOSIS — I5032 Chronic diastolic (congestive) heart failure: Secondary | ICD-10-CM | POA: Diagnosis not present

## 2023-02-27 LAB — BASIC METABOLIC PANEL: Glucose: 129 mg/dL — ABNORMAL HIGH (ref 70–99)

## 2023-02-27 NOTE — Telephone Encounter (Signed)
Returned call to patient, and reviewed the following recommendations. Patient is agreeable and verbalizes understanding that we will call her with further recommendations once her labs come back.     "Recommend she take a Metolazone tomorrow morning prior to Bumex.  Ensure restricting to <2L fluid in take and follow low sodium diet.  Will further adjust medications when labs available for review.    Alver Sorrow, NP"

## 2023-02-27 NOTE — Telephone Encounter (Signed)
Recommend she take a Metolazone tomorrow morning prior to Bumex.  Ensure restricting to <2L fluid in take and follow low sodium diet.  Will further adjust medications when labs available for review.   Alver Sorrow, NP

## 2023-02-27 NOTE — Telephone Encounter (Signed)
  Pt c/o BP issue: STAT if pt c/o blurred vision, one-sided weakness or slurred speech  1. What are your last 5 BP readings?  61/41 BP yesterday  77/41 BP today  2. Are you having any other symptoms (ex. Dizziness, headache, blurred vision, passed out)?   3. What is your BP issue? Pt said, after all the medication adjustments. Her BP is still running low and she gained 20 lbs for the last 2 weeks. She said, she might need to be seen sooner. She will be coming at our office to get labs done today, if she didn't get a call back she might stop by at the office

## 2023-02-27 NOTE — Telephone Encounter (Signed)
Returned call to patient,   Previously recommended to not exceed Bumex twice per week.   She states her BP is really low 61/41 and 77/41. She says she is dizzy and low energy. She states her fluid pill and bumex is not keeping the weight off. She took it yesterday and gained 3.8 pounds last night. She thinks since her visit she has gained about   Office visit 324  6/1 327 Today 339.8

## 2023-02-28 ENCOUNTER — Telehealth: Payer: Self-pay | Admitting: Cardiovascular Disease

## 2023-02-28 ENCOUNTER — Encounter: Payer: Self-pay | Admitting: Oncology

## 2023-02-28 ENCOUNTER — Telehealth (HOSPITAL_BASED_OUTPATIENT_CLINIC_OR_DEPARTMENT_OTHER): Payer: Self-pay

## 2023-02-28 DIAGNOSIS — E876 Hypokalemia: Secondary | ICD-10-CM

## 2023-02-28 DIAGNOSIS — I10 Essential (primary) hypertension: Secondary | ICD-10-CM

## 2023-02-28 DIAGNOSIS — I5032 Chronic diastolic (congestive) heart failure: Secondary | ICD-10-CM

## 2023-02-28 DIAGNOSIS — I4821 Permanent atrial fibrillation: Secondary | ICD-10-CM

## 2023-02-28 LAB — BASIC METABOLIC PANEL
BUN/Creatinine Ratio: 14 (ref 12–28)
BUN: 17 mg/dL (ref 8–27)
CO2: 25 mmol/L (ref 20–29)
Calcium: 8.8 mg/dL (ref 8.7–10.3)
Chloride: 101 mmol/L (ref 96–106)
Creatinine, Ser: 1.21 mg/dL — ABNORMAL HIGH (ref 0.57–1.00)
Potassium: 2.9 mmol/L — CL (ref 3.5–5.2)
Sodium: 141 mmol/L (ref 134–144)
eGFR: 49 mL/min/{1.73_m2} — ABNORMAL LOW (ref >59)

## 2023-02-28 MED ORDER — POTASSIUM CHLORIDE ER 10 MEQ PO CPCR
ORAL_CAPSULE | ORAL | 11 refills | Status: DC
Start: 2023-02-28 — End: 2023-03-12

## 2023-02-28 MED ORDER — METOPROLOL SUCCINATE ER 100 MG PO TB24: 100.0000 mg | ORAL_TABLET | Freq: Every day | ORAL | 3 refills | Status: DC

## 2023-02-28 MED ORDER — METOLAZONE 5 MG PO TABS
5.0000 mg | ORAL_TABLET | ORAL | 3 refills | Status: DC
Start: 2023-02-28 — End: 2023-07-11

## 2023-02-28 MED ORDER — BUMETANIDE 2 MG PO TABS
ORAL_TABLET | ORAL | 3 refills | Status: DC
Start: 2023-02-28 — End: 2023-03-12

## 2023-02-28 MED ORDER — SPIRONOLACTONE 25 MG PO TABS
ORAL_TABLET | ORAL | 3 refills | Status: DC
Start: 2023-02-28 — End: 2023-03-12

## 2023-02-28 NOTE — Telephone Encounter (Signed)
If she wants to return to Metoprolol 100mg  dose she may but she needs to check HR once per day. If HR persistently >100 bpm will need to return to higher dose.   Alver Sorrow, NP

## 2023-02-28 NOTE — Telephone Encounter (Signed)
Hannah Khan with [facility] calling with critical lab results   Hannah Khan disconnected call before I could get triage on the line

## 2023-02-28 NOTE — Telephone Encounter (Signed)
Returned call to patient with the following recommendations, patient verbalizes understanding. She states she has an echo on Tuesday so she will come early for echo and have her labs done.    "If she wants to return to Metoprolol 100mg  dose she may but she needs to check HR once per day. If HR persistently >100 bpm will need to return to higher dose.    Alver Sorrow, NP"

## 2023-02-28 NOTE — Telephone Encounter (Signed)
Lab results already addressed with patient and NP

## 2023-02-28 NOTE — Telephone Encounter (Signed)
Advised [facility] has been received and addressed

## 2023-02-28 NOTE — Telephone Encounter (Signed)
Critical results addressed with patient, [facility] notified.

## 2023-02-28 NOTE — Telephone Encounter (Signed)
[facility] is calling to report critical lab reports on patient

## 2023-02-28 NOTE — Addendum Note (Signed)
Addended by: Marlene Lard on: 02/28/2023 10:35 AM   Modules accepted: Orders

## 2023-02-28 NOTE — Telephone Encounter (Addendum)
Returned call to patient and provided the following recommendations, patient verbalizes understanding. BP this morning was still low at 76/42, Yesterday am 77/42. Patient notes that she is taking Metoprolol 150mg  daily and thinks this may be dropping her BP too much.  Later last week 61/41 65/41. Patient states she tolerated the Metoprolol 100 better. Labs ordered, patient states she will come Monday for labs- printed and put at front desk for patient to pick up. Rx updated with pharmacy.    ----- Message from Alver Sorrow, NP sent at 02/28/2023  9:51 AM EDT ----- Potassium remains markedly low at 2.9.  Kidney function shows decreased from previous.  Adjust medications as follows: - Bumex 2mg  on Sundays, Tuesdays, Thursdays, Saturdays -Spironolactone 25mg  on Mondays, Wednesdays, Fridays -Metolazone once per week on Sundays 30 minutes prior to Bumex. -Increase potassium to TID  Hopeful adding Spironolactone will help to raise potassium level.  Avoid taking Spironolactone and Bumex together to prevent further hypotension.  Repeat BMP Friday or Monday for monitoring.

## 2023-03-05 ENCOUNTER — Inpatient Hospital Stay (HOSPITAL_COMMUNITY)
Admission: EM | Admit: 2023-03-05 | Discharge: 2023-03-12 | DRG: 871 | Disposition: A | Discharge status: Home Health | Payer: Medicare PPO | Attending: Internal Medicine | Admitting: Internal Medicine

## 2023-03-05 ENCOUNTER — Other Ambulatory Visit: Payer: Self-pay

## 2023-03-05 ENCOUNTER — Emergency Department (HOSPITAL_COMMUNITY): Payer: Medicare PPO

## 2023-03-05 ENCOUNTER — Encounter (HOSPITAL_COMMUNITY): Payer: Self-pay

## 2023-03-05 DIAGNOSIS — N179 Acute kidney failure, unspecified: Secondary | ICD-10-CM | POA: Diagnosis not present

## 2023-03-05 DIAGNOSIS — R0902 Hypoxemia: Secondary | ICD-10-CM | POA: Diagnosis not present

## 2023-03-05 DIAGNOSIS — K7581 Nonalcoholic steatohepatitis (NASH): Secondary | ICD-10-CM | POA: Diagnosis not present

## 2023-03-05 DIAGNOSIS — G4733 Obstructive sleep apnea (adult) (pediatric): Secondary | ICD-10-CM | POA: Diagnosis present

## 2023-03-05 DIAGNOSIS — I5031 Acute diastolic (congestive) heart failure: Secondary | ICD-10-CM

## 2023-03-05 DIAGNOSIS — K766 Portal hypertension: Secondary | ICD-10-CM | POA: Diagnosis present

## 2023-03-05 DIAGNOSIS — R079 Chest pain, unspecified: Secondary | ICD-10-CM | POA: Diagnosis not present

## 2023-03-05 DIAGNOSIS — Z7984 Long term (current) use of oral hypoglycemic drugs: Secondary | ICD-10-CM

## 2023-03-05 DIAGNOSIS — E876 Hypokalemia: Secondary | ICD-10-CM

## 2023-03-05 DIAGNOSIS — I5033 Acute on chronic diastolic (congestive) heart failure: Secondary | ICD-10-CM | POA: Diagnosis not present

## 2023-03-05 DIAGNOSIS — E785 Hyperlipidemia, unspecified: Secondary | ICD-10-CM | POA: Diagnosis present

## 2023-03-05 DIAGNOSIS — K7460 Unspecified cirrhosis of liver: Secondary | ICD-10-CM | POA: Diagnosis present

## 2023-03-05 DIAGNOSIS — A419 Sepsis, unspecified organism: Principal | ICD-10-CM | POA: Diagnosis present

## 2023-03-05 DIAGNOSIS — Z794 Long term (current) use of insulin: Secondary | ICD-10-CM

## 2023-03-05 DIAGNOSIS — R0689 Other abnormalities of breathing: Secondary | ICD-10-CM | POA: Diagnosis not present

## 2023-03-05 DIAGNOSIS — R002 Palpitations: Secondary | ICD-10-CM | POA: Diagnosis not present

## 2023-03-05 DIAGNOSIS — E119 Type 2 diabetes mellitus without complications: Secondary | ICD-10-CM | POA: Diagnosis not present

## 2023-03-05 DIAGNOSIS — L0390 Cellulitis, unspecified: Secondary | ICD-10-CM | POA: Diagnosis not present

## 2023-03-05 DIAGNOSIS — R000 Tachycardia, unspecified: Secondary | ICD-10-CM | POA: Diagnosis not present

## 2023-03-05 DIAGNOSIS — M797 Fibromyalgia: Secondary | ICD-10-CM | POA: Diagnosis present

## 2023-03-05 DIAGNOSIS — I4821 Permanent atrial fibrillation: Secondary | ICD-10-CM | POA: Diagnosis not present

## 2023-03-05 DIAGNOSIS — Z79899 Other long term (current) drug therapy: Secondary | ICD-10-CM | POA: Diagnosis not present

## 2023-03-05 DIAGNOSIS — R6520 Severe sepsis without septic shock: Secondary | ICD-10-CM | POA: Diagnosis present

## 2023-03-05 DIAGNOSIS — Z8719 Personal history of other diseases of the digestive system: Secondary | ICD-10-CM

## 2023-03-05 DIAGNOSIS — I4891 Unspecified atrial fibrillation: Secondary | ICD-10-CM | POA: Diagnosis not present

## 2023-03-05 DIAGNOSIS — Z885 Allergy status to narcotic agent status: Secondary | ICD-10-CM

## 2023-03-05 DIAGNOSIS — Z888 Allergy status to other drugs, medicaments and biological substances status: Secondary | ICD-10-CM

## 2023-03-05 DIAGNOSIS — I950 Idiopathic hypotension: Secondary | ICD-10-CM | POA: Diagnosis present

## 2023-03-05 DIAGNOSIS — Z6841 Body Mass Index (BMI) 40.0 and over, adult: Secondary | ICD-10-CM | POA: Diagnosis not present

## 2023-03-05 DIAGNOSIS — Z823 Family history of stroke: Secondary | ICD-10-CM

## 2023-03-05 DIAGNOSIS — Z7985 Long-term (current) use of injectable non-insulin antidiabetic drugs: Secondary | ICD-10-CM

## 2023-03-05 DIAGNOSIS — L03311 Cellulitis of abdominal wall: Secondary | ICD-10-CM | POA: Diagnosis present

## 2023-03-05 DIAGNOSIS — I110 Hypertensive heart disease with heart failure: Secondary | ICD-10-CM | POA: Diagnosis not present

## 2023-03-05 DIAGNOSIS — Z87891 Personal history of nicotine dependence: Secondary | ICD-10-CM | POA: Diagnosis not present

## 2023-03-05 DIAGNOSIS — D61818 Other pancytopenia: Secondary | ICD-10-CM | POA: Diagnosis not present

## 2023-03-05 DIAGNOSIS — R0602 Shortness of breath: Secondary | ICD-10-CM | POA: Diagnosis not present

## 2023-03-05 DIAGNOSIS — Z7189 Other specified counseling: Secondary | ICD-10-CM | POA: Diagnosis not present

## 2023-03-05 DIAGNOSIS — I5032 Chronic diastolic (congestive) heart failure: Secondary | ICD-10-CM

## 2023-03-05 DIAGNOSIS — M5430 Sciatica, unspecified side: Secondary | ICD-10-CM | POA: Diagnosis present

## 2023-03-05 DIAGNOSIS — Z886 Allergy status to analgesic agent status: Secondary | ICD-10-CM

## 2023-03-05 DIAGNOSIS — Z83438 Family history of other disorder of lipoprotein metabolism and other lipidemia: Secondary | ICD-10-CM

## 2023-03-05 DIAGNOSIS — I8510 Secondary esophageal varices without bleeding: Secondary | ICD-10-CM | POA: Diagnosis present

## 2023-03-05 DIAGNOSIS — G8929 Other chronic pain: Secondary | ICD-10-CM | POA: Diagnosis present

## 2023-03-05 DIAGNOSIS — Z881 Allergy status to other antibiotic agents status: Secondary | ICD-10-CM

## 2023-03-05 DIAGNOSIS — I082 Rheumatic disorders of both aortic and tricuspid valves: Secondary | ICD-10-CM | POA: Diagnosis not present

## 2023-03-05 DIAGNOSIS — I10 Essential (primary) hypertension: Secondary | ICD-10-CM | POA: Diagnosis present

## 2023-03-05 DIAGNOSIS — I4819 Other persistent atrial fibrillation: Secondary | ICD-10-CM | POA: Diagnosis not present

## 2023-03-05 DIAGNOSIS — Z91013 Allergy to seafood: Secondary | ICD-10-CM

## 2023-03-05 DIAGNOSIS — I9589 Other hypotension: Secondary | ICD-10-CM

## 2023-03-05 DIAGNOSIS — R0600 Dyspnea, unspecified: Secondary | ICD-10-CM | POA: Diagnosis not present

## 2023-03-05 DIAGNOSIS — Z9049 Acquired absence of other specified parts of digestive tract: Secondary | ICD-10-CM

## 2023-03-05 DIAGNOSIS — Z8249 Family history of ischemic heart disease and other diseases of the circulatory system: Secondary | ICD-10-CM

## 2023-03-05 DIAGNOSIS — R918 Other nonspecific abnormal finding of lung field: Secondary | ICD-10-CM | POA: Diagnosis not present

## 2023-03-05 DIAGNOSIS — Z801 Family history of malignant neoplasm of trachea, bronchus and lung: Secondary | ICD-10-CM

## 2023-03-05 DIAGNOSIS — I129 Hypertensive chronic kidney disease with stage 1 through stage 4 chronic kidney disease, or unspecified chronic kidney disease: Secondary | ICD-10-CM | POA: Diagnosis not present

## 2023-03-05 DIAGNOSIS — R188 Other ascites: Secondary | ICD-10-CM | POA: Diagnosis not present

## 2023-03-05 DIAGNOSIS — I499 Cardiac arrhythmia, unspecified: Secondary | ICD-10-CM | POA: Diagnosis not present

## 2023-03-05 LAB — CBC WITH DIFFERENTIAL/PLATELET
Abs Immature Granulocytes: 0.02 10*3/uL (ref 0.00–0.07)
Basophils Absolute: 0 10*3/uL (ref 0.0–0.1)
Basophils Relative: 0 %
Eosinophils Absolute: 0 10*3/uL (ref 0.0–0.5)
Eosinophils Relative: 0 %
HCT: 36.4 % (ref 36.0–46.0)
Hemoglobin: 11.7 g/dL — ABNORMAL LOW (ref 12.0–15.0)
Immature Granulocytes: 0 %
Lymphocytes Relative: 3 %
Lymphs Abs: 0.3 10*3/uL — ABNORMAL LOW (ref 0.7–4.0)
MCH: 32.6 pg (ref 26.0–34.0)
MCHC: 32.1 g/dL (ref 30.0–36.0)
MCV: 101.4 fL — ABNORMAL HIGH (ref 80.0–100.0)
Monocytes Absolute: 0.4 10*3/uL (ref 0.1–1.0)
Monocytes Relative: 6 %
Neutro Abs: 6.7 10*3/uL (ref 1.7–7.7)
Neutrophils Relative %: 91 %
Platelets: 30 10*3/uL — ABNORMAL LOW (ref 150–400)
RBC: 3.59 MIL/uL — ABNORMAL LOW (ref 3.87–5.11)
RDW: 15.5 % (ref 11.5–15.5)
WBC: 7.4 10*3/uL (ref 4.0–10.5)
nRBC: 0 % (ref 0.0–0.2)

## 2023-03-05 LAB — BASIC METABOLIC PANEL
Anion gap: 10 (ref 5–15)
BUN: 18 mg/dL (ref 8–23)
CO2: 23 mmol/L (ref 22–32)
Calcium: 8.4 mg/dL — ABNORMAL LOW (ref 8.9–10.3)
Chloride: 103 mmol/L (ref 98–111)
Creatinine, Ser: 1.38 mg/dL — ABNORMAL HIGH (ref 0.44–1.00)
GFR, Estimated: 42 mL/min — ABNORMAL LOW (ref >60)
Glucose, Bld: 149 mg/dL — ABNORMAL HIGH (ref 70–99)
Potassium: 4.2 mmol/L (ref 3.5–5.1)
Sodium: 136 mmol/L (ref 135–145)

## 2023-03-05 LAB — URINALYSIS, ROUTINE W REFLEX MICROSCOPIC
Bilirubin Urine: NEGATIVE
Glucose, UA: 150 mg/dL — AB
Ketones, ur: NEGATIVE mg/dL
Leukocytes,Ua: NEGATIVE
Nitrite: NEGATIVE
Protein, ur: NEGATIVE mg/dL
Specific Gravity, Urine: 1.006 (ref 1.005–1.030)
pH: 6 (ref 5.0–8.0)

## 2023-03-05 LAB — HEPATIC FUNCTION PANEL
ALT: 21 U/L (ref 0–44)
AST: 42 U/L — ABNORMAL HIGH (ref 15–41)
Albumin: 2.7 g/dL — ABNORMAL LOW (ref 3.5–5.0)
Alkaline Phosphatase: 71 U/L (ref 38–126)
Bilirubin, Direct: 0.7 mg/dL — ABNORMAL HIGH (ref 0.0–0.2)
Indirect Bilirubin: 4.3 mg/dL — ABNORMAL HIGH (ref 0.3–0.9)
Total Bilirubin: 5 mg/dL — ABNORMAL HIGH (ref 0.3–1.2)
Total Protein: 5.9 g/dL — ABNORMAL LOW (ref 6.5–8.1)

## 2023-03-05 LAB — CBG MONITORING, ED: Glucose-Capillary: 164 mg/dL — ABNORMAL HIGH (ref 70–99)

## 2023-03-05 LAB — BRAIN NATRIURETIC PEPTIDE: B Natriuretic Peptide: 362.6 pg/mL — ABNORMAL HIGH (ref 0.0–100.0)

## 2023-03-05 LAB — MAGNESIUM: Magnesium: 1.8 mg/dL (ref 1.7–2.4)

## 2023-03-05 LAB — PROTIME-INR
INR: 1.2 (ref 0.8–1.2)
Prothrombin Time: 15.8 seconds — ABNORMAL HIGH (ref 11.4–15.2)

## 2023-03-05 LAB — LACTIC ACID, PLASMA: Lactic Acid, Venous: 2.2 mmol/L (ref 0.5–1.9)

## 2023-03-05 MED ORDER — MORPHINE SULFATE (PF) 2 MG/ML IV SOLN: 2.0000 mg | INTRAVENOUS | Status: DC | PRN

## 2023-03-05 MED ORDER — HYDROMORPHONE HCL 1 MG/ML IJ SOLN: 0.5000 mg | Freq: Once | INTRAMUSCULAR | Status: DC

## 2023-03-05 MED ORDER — ONDANSETRON HCL 4 MG PO TABS: 4.0000 mg | ORAL_TABLET | Freq: Four times a day (QID) | ORAL | Status: DC | PRN

## 2023-03-05 MED ORDER — INSULIN ASPART 100 UNIT/ML IJ SOLN: 0.0000 [IU] | Freq: Every day | INTRAMUSCULAR | Status: DC

## 2023-03-05 MED ORDER — BUMETANIDE 0.25 MG/ML IJ SOLN
2.0000 mg | Freq: Once | INTRAMUSCULAR | Status: DC
  Filled 2023-03-05: qty 8

## 2023-03-05 MED ORDER — SODIUM CHLORIDE 0.9 % IV SOLN
2.0000 g | INTRAVENOUS | Status: DC
  Administered 2023-03-05: 2 g via INTRAVENOUS
  Filled 2023-03-05: qty 20

## 2023-03-05 MED ORDER — LACTULOSE 10 GM/15ML PO SOLN
10.0000 g | Freq: Three times a day (TID) | ORAL | Status: DC
  Administered 2023-03-05 – 2023-03-12 (×20): 10 g via ORAL
  Filled 2023-03-05 (×21): qty 15

## 2023-03-05 MED ORDER — ONDANSETRON HCL 4 MG/2ML IJ SOLN
4.0000 mg | Freq: Once | INTRAMUSCULAR | Status: AC
  Administered 2023-03-05: 4 mg via INTRAVENOUS
  Filled 2023-03-05: qty 2

## 2023-03-05 MED ORDER — SODIUM CHLORIDE 0.9 % IV BOLUS
500.0000 mL | Freq: Once | INTRAVENOUS | Status: AC
  Administered 2023-03-05: 500 mL via INTRAVENOUS

## 2023-03-05 MED ORDER — ONDANSETRON HCL 4 MG/2ML IJ SOLN: 4.0000 mg | Freq: Four times a day (QID) | INTRAMUSCULAR | Status: DC | PRN

## 2023-03-05 MED ORDER — METOPROLOL SUCCINATE ER 100 MG PO TB24
100.0000 mg | ORAL_TABLET | Freq: Every day | ORAL | Status: DC
  Administered 2023-03-06: 100 mg via ORAL
  Filled 2023-03-05: qty 1

## 2023-03-05 MED ORDER — ALBUTEROL SULFATE (2.5 MG/3ML) 0.083% IN NEBU: 2.5000 mg | INHALATION_SOLUTION | RESPIRATORY_TRACT | Status: DC | PRN

## 2023-03-05 MED ORDER — METOPROLOL SUCCINATE ER 25 MG PO TB24: 100.0000 mg | ORAL_TABLET | Freq: Every day | ORAL | Status: DC

## 2023-03-05 MED ORDER — FENTANYL CITRATE PF 50 MCG/ML IJ SOSY
50.0000 ug | PREFILLED_SYRINGE | Freq: Once | INTRAMUSCULAR | Status: DC
  Filled 2023-03-05: qty 1

## 2023-03-05 MED ORDER — FENTANYL CITRATE PF 50 MCG/ML IJ SOSY: 50.0000 ug | PREFILLED_SYRINGE | Freq: Once | INTRAMUSCULAR | Status: DC

## 2023-03-05 MED ORDER — DILTIAZEM HCL-DEXTROSE 125-5 MG/125ML-% IV SOLN (PREMIX)
5.0000 mg/h | INTRAVENOUS | Status: DC
  Administered 2023-03-05: 5 mg/h via INTRAVENOUS
  Administered 2023-03-06 – 2023-03-07 (×3): 10 mg/h via INTRAVENOUS
  Filled 2023-03-05 (×4): qty 125

## 2023-03-05 MED ORDER — SODIUM CHLORIDE 0.9 % IV SOLN: 2.0000 g | INTRAVENOUS | Status: DC

## 2023-03-05 MED ORDER — LIDOCAINE 5 % EX PTCH
1.0000 | MEDICATED_PATCH | CUTANEOUS | Status: DC
  Administered 2023-03-05 – 2023-03-06 (×2): 1 via TRANSDERMAL
  Filled 2023-03-05 (×3): qty 1

## 2023-03-05 MED ORDER — ALBUTEROL SULFATE HFA 108 (90 BASE) MCG/ACT IN AERS: 2.0000 | INHALATION_SPRAY | RESPIRATORY_TRACT | Status: DC | PRN

## 2023-03-05 MED ORDER — INSULIN ASPART 100 UNIT/ML IJ SOLN
0.0000 [IU] | Freq: Three times a day (TID) | INTRAMUSCULAR | Status: DC
  Administered 2023-03-06 (×2): 3 [IU] via SUBCUTANEOUS

## 2023-03-05 MED ORDER — ACETAMINOPHEN 325 MG PO TABS
650.0000 mg | ORAL_TABLET | Freq: Four times a day (QID) | ORAL | Status: DC | PRN
  Administered 2023-03-05 – 2023-03-07 (×3): 650 mg via ORAL
  Filled 2023-03-05 (×3): qty 2

## 2023-03-05 NOTE — H&P (Signed)
History and Physical    Patient: Hannah Khan GNF:[id number] DOB: 1956/09/03 DOA: 03/05/2023 DOS: the patient was seen and examined on 03/05/2023 PCP: Darrow Bussing, MD  Patient coming from: Home  Chief Complaint:  Chief Complaint  Patient presents with   Shortness of Breath   HPI: Hannah Khan is a 67 y.o. female with medical history significant of multiple medical problems including morbid obesity, type 2 diabetes, history of recurrent GI bleed, Obstructive sleep apnea, essential hypertension, atrial fibrillation, liver cirrhosis secondary to NASH, pancytopenia secondary to liver disease, chronic diastolic heart failure, portal hypertension as a result of cirrhosis, who presents with husband complaining of shortness of breath.  Patient also complained of cellulitis in the left side of her abdomen.  The husband noted a cellulitis in the left abdominal wall today.  Patient has chronic back pain due to sciatica and having pain 10 out of 10.  She has been allergic to other pain medications.  About a week ago her medications were altered especially diuretics.  Since then she has noted worsening weight gain.  Patient has gained 14 pounds since then with worsening lower extremity pitting edema.  The shortness of breath got worse from last night.  No fever or chills at home but here was found to have significant fever chills and she meets sepsis criteria.  Patient has low blood pressure prominently secondary to liver cirrhosis.  Here she has been hypotensive but fully awake and alert.  Systolic has been in the 80s.  Heart rate 121.  Point patient is being admitted with severe sepsis secondary to abdominal wall cellulitis, atrial fibrillation with rapid ventricular response among other things.  Review of Systems: As mentioned in the history of present illness. All other systems reviewed and are negative. Past Medical History:  Diagnosis Date   Anemia    hx of   Arthritis    knees   CHF  (congestive heart failure) (HCC)    chronic diastolic   Diabetes mellitus without complication (HCC)    type 2   Dyspnea    Dysrhythmia    Fibromyalgia    Gastric varices    large   H/O transfusion of platelets    Heart murmur    never has caused any problems   Hx of colonic polyps    s/p partial colectomy   Hyperlipidemia    Hypertension    Joint pain    in knees and back spasms   Left leg swelling    wear compression hose   Liver cirrhosis secondary to NASH (nonalcoholic steatohepatitis) (HCC) dx nov 2014   had enlarged spleen also    Morbid obesity (HCC)    Persistent atrial fibrillation (HCC)    Pneumonia 10/2016   x 5   PONV (postoperative nausea and vomiting)    in past none recent   Portal hypertension (HCC)    Sleep apnea    uses cpap, setting varies between 14-20   Spleen enlarged    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 [facility])    Past Surgical History:  Procedure Laterality Date   BREAST LUMPECTOMY WITH RADIOACTIVE SEED LOCALIZATION Right 08/29/2017   Procedure: RIGHT BREAST LUMPECTOMY WITH RADIOACTIVE SEEDS LOCALIZATION;  Surgeon: Harriette Bouillon, MD;  Location: [facility] OR;  Service: General;  Laterality: Right;   CESAREAN SECTION  1989   CHOLECYSTECTOMY  20 yrs ago   COLECTOMY  2011   COLONOSCOPY     COLONOSCOPY N/A 09/05/2018   Procedure: COLONOSCOPY;  Surgeon: Willis Modena,  MD;  Location: [facility];  Service: Endoscopy;  Laterality: N/A;   COLONOSCOPY WITH PROPOFOL Bilateral 05/04/2022   Procedure: COLONOSCOPY WITH PROPOFOL;  Surgeon: Willis Modena, MD;  Location: [facility];  Service: Gastroenterology;  Laterality: Bilateral;   DILATATION & CURETTAGE/HYSTEROSCOPY WITH MYOSURE N/A 05/06/2016   Procedure: DILATATION & CURETTAGE/HYSTEROSCOPY WITH MYOSURE WITH POLYPECTOMY;  Surgeon: Myna Hidalgo, DO;  Location: [facility];  Service: Gynecology;  Laterality: N/A;   ESOPHAGOGASTRODUODENOSCOPY (EGD) WITH PROPOFOL N/A 09/04/2013   Procedure:  ESOPHAGOGASTRODUODENOSCOPY (EGD) WITH PROPOFOL;  Surgeon: Willis Modena, MD;  Location: [facility];  Service: Endoscopy;  Laterality: N/A;   ESOPHAGOGASTRODUODENOSCOPY (EGD) WITH PROPOFOL Bilateral 05/04/2022   Procedure: ESOPHAGOGASTRODUODENOSCOPY (EGD) WITH PROPOFOL;  Surgeon: Willis Modena, MD;  Location: [facility];  Service: Gastroenterology;  Laterality: Bilateral;   IR INTRAVASCULAR ULTRASOUND NON CORONARY  12/23/2022   IR RADIOLOGIST EVAL & MGMT  09/21/2022   IR RADIOLOGIST EVAL & MGMT  10/06/2022   IR RADIOLOGIST EVAL & MGMT  11/02/2022   IR RADIOLOGIST EVAL & MGMT  01/31/2023   IR TIPS  12/23/2022   IR US GUIDE VASC ACCESS RIGHT  12/23/2022   KNEE ARTHROSCOPY  yrs ago   bilateral, one done x 1, one done twice   POLYPECTOMY  09/05/2018   Procedure: POLYPECTOMY;  Surgeon: Willis Modena, MD;  Location: [facility];  Service: Endoscopy;;   RADIOLOGY WITH ANESTHESIA N/A 12/23/2022   Procedure: TIPS;  Surgeon: Bennie Dallas, MD;  Location: [facility] OR;  Service: Radiology;  Laterality: N/A;   Social History:  reports that she quit smoking about 48 years ago. Her smoking use included cigarettes. She has a 3.00 pack-year smoking history. She has never used smokeless tobacco. She reports that she does not currently use alcohol. She reports that she does not use drugs.  Allergies  Allergen Reactions   Celecoxib Other (See Comments)    "speech slurred" with celebrex   Shellfish Allergy Swelling and Rash    TINGLING AND SWELLING OF LIPS. LARGE WHELPS QUARTER-SIZE   Barium-Containing Compounds Other (See Comments)    TACHYCARDIA   Prednisone Other (See Comments)    TACHYCARDIA   Statins Other (See Comments)    AGGRAVATED FIBROMYALGIA   Fentanyl     Hallucinations    Ibuprofen Other (See Comments)    UNSPECIFIED SPECIFIC REACTION >> "DUE TO PLATELETS"   Tramadol     Hallucination    Tylenol [Acetaminophen] Other (See Comments)    UNSPECIFIED SPECIFIC REACTION >> "DUE TO PLATELETS"    Zetia [Ezetimibe]     Headache    Family History  Problem Relation Age of Onset   Heart failure Mother    Cancer Mother    Hyperlipidemia Mother    Congestive Heart Failure Mother    Stroke Mother    Lung cancer Father    Cancer Father    Cancer Brother    Hyperlipidemia Brother    Stroke Other     Prior to Admission medications   Medication Sig Start Date End Date Taking? Authorizing Provider  albuterol (PROVENTIL) (2.5 MG/3ML) 0.083% nebulizer solution Take 3 mLs (2.5 mg total) by nebulization every 6 (six) hours as needed for wheezing or shortness of breath. Patient not taking: Reported on 02/14/2023 03/22/21   Noralee Stain, DO  albuterol (VENTOLIN HFA) 108 (90 Base) MCG/ACT inhaler Inhale 1-2 puffs into the lungs every 6 (six) hours as needed for wheezing or shortness of breath.    [provider]  Bacitracin-Polymyxin B (NEOSPORIN EX) Apply  1 Application topically daily as needed (wound Treatment).    [provider]  bumetanide (BUMEX) 2 MG tablet Bumex 2mg  on Sundays, Tuesdays, Thursdays, Saturdays 02/28/23   Alver Sorrow, NP  diclofenac Sodium (VOLTAREN) 1 % GEL Apply 1 Application topically at bedtime as needed (pain).    [provider]  diltiazem (CARDIZEM CD) 180 MG 24 hr capsule TAKE 1 CAPSULE BY MOUTH EVERY DAY Patient taking differently: Take 180 mg by mouth every evening. 10/20/22   Alver Sorrow, NP  empagliflozin (JARDIANCE) 25 MG TABS tablet Take 25 mg by mouth in the morning.    [provider]  lactulose (CHRONULAC) 10 GM/15ML solution Take 10 g by mouth 3 (three) times daily. 03/17/21   [provider]  Lancets (ONETOUCH DELICA PLUS [place]) MISC Apply topically 3 (three) times daily. 04/10/20   [provider]  metolazone (ZAROXOLYN) 5 MG tablet Take 1 tablet (5 mg total) by mouth once a week. Take one tablet on Sundays 30 minutes before Bumex 02/28/23   Alver Sorrow, NP  metoprolol succinate  (TOPROL-XL) 100 MG 24 hr tablet Take 1 tablet (100 mg total) by mouth daily. 02/28/23   Alver Sorrow, NP  Multiple Vitamins-Minerals (PRESERVISION AREDS 2) CAPS Take 1 capsule by mouth 2 (two) times daily.    [provider]  [facility] VERIO test strip 1 each 3 (three) times daily. 04/13/20   [provider]  potassium chloride (MICRO-K) 10 MEQ CR capsule Take 40 mEq ( 4 tablets)  by mouth three times per day 02/28/23   Alver Sorrow, NP  Propylene Glycol (SYSTANE BALANCE) 0.6 % SOLN Place 1 drop into both eyes 2 (two) times daily.    [provider]  Semaglutide, 2 MG/DOSE, (OZEMPIC, 2 MG/DOSE,) 8 MG/3ML SOPN Inject 2 mg into the skin once a week. Patient taking differently: Inject 2 mg into the skin every Thursday. 11/03/22   Alver Sorrow, NP  spironolactone (ALDACTONE) 25 MG tablet Monday, Wednesday and Friday- Take one tablet daily 02/28/23   Alver Sorrow, NP  XIFAXAN 550 MG TABS tablet Take 550 mg by mouth 2 (two) times daily. 03/13/21   [provider]    Physical Exam: Vitals:   03/05/23 1700 03/05/23 1730 03/05/23 1800 03/05/23 1845  BP: (!) 85/55 106/69 (!) 94/59 117/74  Pulse: (!) 117 (!) 130 (!) 129 (!) 131  Resp: 18 (!) 22 (!) 21 18  Temp:      TempSrc:      SpO2: 96% 96% 94% 94%  Weight:      Height:       Constitutional: Morbidly obese, in obvious distress due to pain  Eyes: PERRL, lids and conjunctivae normal ENMT: Mucous membranes are moist. Posterior pharynx clear of any exudate or lesions.Normal dentition.  Neck: normal, supple, no masses, no thyromegaly Respiratory: clear to auscultation bilaterally, no wheezing, no crackles. Normal respiratory effort. No accessory muscle use.  Cardiovascular: Irregularly irregular with tachycardia no murmurs / rubs / gallops.  2+ extremity edema. 2+ pedal pulses. No carotid bruits.  Abdomen: Distended, mild ascites no tenderness, no masses palpated. No hepatosplenomegaly. Bowel sounds  positive.  Musculoskeletal: Good range of motion, no joint swelling or tenderness, Skin: Left-sided abdominal wall red warm visible cellulitis. No induration Neurologic: CN 2-12 grossly intact. Sensation intact, DTR normal. Strength 5/5 in all 4.  Psychiatric: Normal judgment and insight. Alert and oriented x 3.  Depressed mood  Data Reviewed:  Temperature 101.7,  blood pressure 88/53, pulse 131 respiratory of 29 oxygen sats 95% on room air.  White count is 7.4 hemoglobin 11.7 platelets 30.  Sodium 136 potassium 4.2 creatinine 1.38 calcium 8.4.  Lactic acid is 2.2.  Urinalysis is negative.  Chest x-ray showed cardiomegaly without acute process.  Assessment and Plan:  #1 severe sepsis: Secondary to abdominal wall cellulitis.  Patient will be admitted.  Due to fluid overload from cirrhosis we will avoid fluid boluses of fluid treatment for sepsis.  Liver-related manage patient.  #2 abdominal wall cellulitis: Empiric antibiotics.  Maintain cultures especially blood cultures and monitor.  #3 atrial fibrillation with rapid ventricular response: Patient has been started on Cardizem drip.  Patient's blood pressure is however marginal.  Cardiology consulted.  She may benefit from amiodarone or digoxin.  I will defer to cardiology.  #4 liver cirrhosis: Patient has pancytopenia including thrombocytopenia from liver disease.  On lactulose that we will continue with.  Her low blood pressure is most likely consistent with her history of liver disease.  She is not symptomatic.  #5 morbid obesity: Counseling.  #6 type 2 diabetes: Sliding scale insulin.  #7 essential hypertension: Patient currently hypotensive.  Will hold other home regimen.  #8 persistent hypotension: Probably in the setting of sepsis and liver cirrhosis.  #9 obstructive sleep apnea: Will offer CPAP as needed.  #10 chronic sciatic pain: Pain management.  Patient is allergic to narcotics apparently.  Conservative treatment being  done.    Advance Care Planning:   Code Status: Full Code   Consults: None  Family Communication: Husband at bedside  Severity of Illness: The appropriate patient status for this patient is INPATIENT. Inpatient status is judged to be reasonable and necessary in order to provide the required intensity of service to ensure the patient's safety. [name] presenting symptoms, physical exam findings, and initial radiographic and laboratory data in the context of their chronic comorbidities is felt to place them at high risk for further clinical deterioration. Furthermore, it is not anticipated that the patient will be medically stable for discharge from the hospital within 2 midnights of admission.   * I certify that at the point of admission it is my clinical judgment that the patient will require inpatient hospital care spanning beyond 2 midnights from the point of admission due to high intensity of service, high risk for further deterioration and high frequency of surveillance required.*  AuthorLonia Blood, MD 03/05/2023 7:01 PM  For on call review [email or web address].

## 2023-03-05 NOTE — ED Notes (Signed)
Hospitalist at bedside 

## 2023-03-05 NOTE — ED Triage Notes (Addendum)
Pt BIBGEMS from home after having onset of shortness of breath, possible cellulitis on abdomen, and afib rvr per EMS and patient. Pt is alert and oriented speaking in broken sentences unable to catch her breath. 14 lb weight gain per patient with bilateral lower edema. Pt also having bilateral hip pain.   EMS gave a bolus of 10 mg Cardizem 170 HR prior to bolus then 100-130 hr post bolus.   98/34 BP  CBG 146

## 2023-03-05 NOTE — Progress Notes (Signed)
Elink following for sepsis protocol. 

## 2023-03-05 NOTE — ED Notes (Signed)
ED TO INPATIENT HANDOFF REPORT  ED Nurse Name and Phone #: Merlyn Albert PM [phone]  S Name/Age/Gender Hannah Khan 67 y.o. female Room/Bed: 003C/003C  Code Status   Code Status: Full Code  Home/SNF/Other Home Patient oriented to: self, place, time, and situation Is this baseline? Yes   Triage Complete: Triage complete  Chief Complaint Sepsis due to cellulitis (HCC) [L03.90, A41.9]  Triage Note Pt BIBGEMS from home after having onset of shortness of breath, possible cellulitis on abdomen, and afib rvr per EMS and patient. Pt is alert and oriented speaking in broken sentences unable to catch her breath. 14 lb weight gain per patient with bilateral lower edema. Pt also having bilateral hip pain.   EMS gave a bolus of 10 mg Cardizem 170 HR prior to bolus then 100-130 hr post bolus.   98/34 BP  CBG 146    Allergies Allergies  Allergen Reactions   Celecoxib Other (See Comments)    "speech slurred" with celebrex   Shellfish Allergy Swelling and Rash    TINGLING AND SWELLING OF LIPS. LARGE WHELPS QUARTER-SIZE   Barium-Containing Compounds Other (See Comments)    TACHYCARDIA   Prednisone Other (See Comments)    TACHYCARDIA   Statins Other (See Comments)    AGGRAVATED FIBROMYALGIA   Fentanyl     Hallucinations    Ibuprofen Other (See Comments)    UNSPECIFIED SPECIFIC REACTION >> "DUE TO PLATELETS"   Tramadol     Hallucination    Tylenol [Acetaminophen] Other (See Comments)    UNSPECIFIED SPECIFIC REACTION >> "DUE TO PLATELETS"   Zetia [Ezetimibe]     Headache    Level of Care/Admitting Diagnosis ED Disposition     ED Disposition  Admit   Condition  --   Comment  Hospital Area: [facility] [[id number]]  Level of Care: Telemetry Cardiac [[id number]]  May admit patient to [facility] or [facility] if equivalent level of care is available:: No  Covid Evaluation: Asymptomatic - no recent exposure (last 10 days) testing not required  Diagnosis: Sepsis  due to cellulitis [facility]) [[id number]]  Admitting Physician: Rometta Emery [[id number]]  Attending Physician: Rometta Emery [[id number]]  Certification:: I certify this patient will need inpatient services for at least 2 midnights  Estimated Length of Stay: 3          B Medical/Surgery History Past Medical History:  Diagnosis Date   Anemia    hx of   Arthritis    knees   CHF (congestive heart failure) (HCC)    chronic diastolic   Diabetes mellitus without complication (HCC)    type 2   Dyspnea    Dysrhythmia    Fibromyalgia    Gastric varices    large   H/O transfusion of platelets    Heart murmur    never has caused any problems   Hx of colonic polyps    s/p partial colectomy   Hyperlipidemia    Hypertension    Joint pain    in knees and back spasms   Left leg swelling    wear compression hose   Liver cirrhosis secondary to NASH (nonalcoholic steatohepatitis) (HCC) dx nov 2014   had enlarged spleen also    Morbid obesity (HCC)    Persistent atrial fibrillation (HCC)    Pneumonia 10/2016   x 5   PONV (postoperative nausea and vomiting)    in past none recent   Portal hypertension (HCC)    Sleep apnea  uses cpap, setting varies between 14-20   Spleen enlarged    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 [facility])    Past Surgical History:  Procedure Laterality Date   BREAST LUMPECTOMY WITH RADIOACTIVE SEED LOCALIZATION Right 08/29/2017   Procedure: RIGHT BREAST LUMPECTOMY WITH RADIOACTIVE SEEDS LOCALIZATION;  Surgeon: Harriette Bouillon, MD;  Location: [facility] OR;  Service: General;  Laterality: Right;   CESAREAN SECTION  1989   CHOLECYSTECTOMY  20 yrs ago   COLECTOMY  2011   COLONOSCOPY     COLONOSCOPY N/A 09/05/2018   Procedure: COLONOSCOPY;  Surgeon: Willis Modena, MD;  Location: [facility] ENDOSCOPY;  Service: Endoscopy;  Laterality: N/A;   COLONOSCOPY WITH PROPOFOL Bilateral 05/04/2022   Procedure: COLONOSCOPY WITH PROPOFOL;  Surgeon: Willis Modena, MD;  Location: [facility]  ENDOSCOPY;  Service: Gastroenterology;  Laterality: Bilateral;   DILATATION & CURETTAGE/HYSTEROSCOPY WITH MYOSURE N/A 05/06/2016   Procedure: DILATATION & CURETTAGE/HYSTEROSCOPY WITH MYOSURE WITH POLYPECTOMY;  Surgeon: Myna Hidalgo, DO;  Location: [facility] ORS;  Service: Gynecology;  Laterality: N/A;   ESOPHAGOGASTRODUODENOSCOPY (EGD) WITH PROPOFOL N/A 09/04/2013   Procedure: ESOPHAGOGASTRODUODENOSCOPY (EGD) WITH PROPOFOL;  Surgeon: Willis Modena, MD;  Location: [facility];  Service: Endoscopy;  Laterality: N/A;   ESOPHAGOGASTRODUODENOSCOPY (EGD) WITH PROPOFOL Bilateral 05/04/2022   Procedure: ESOPHAGOGASTRODUODENOSCOPY (EGD) WITH PROPOFOL;  Surgeon: Willis Modena, MD;  Location: [facility];  Service: Gastroenterology;  Laterality: Bilateral;   IR INTRAVASCULAR ULTRASOUND NON CORONARY  12/23/2022   IR RADIOLOGIST EVAL & MGMT  09/21/2022   IR RADIOLOGIST EVAL & MGMT  10/06/2022   IR RADIOLOGIST EVAL & MGMT  11/02/2022   IR RADIOLOGIST EVAL & MGMT  01/31/2023   IR TIPS  12/23/2022   IR US GUIDE VASC ACCESS RIGHT  12/23/2022   KNEE ARTHROSCOPY  yrs ago   bilateral, one done x 1, one done twice   POLYPECTOMY  09/05/2018   Procedure: POLYPECTOMY;  Surgeon: Willis Modena, MD;  Location: [facility];  Service: Endoscopy;;   RADIOLOGY WITH ANESTHESIA N/A 12/23/2022   Procedure: TIPS;  Surgeon: Bennie Dallas, MD;  Location: [facility] OR;  Service: Radiology;  Laterality: N/A;     A IV Location/Drains/Wounds Patient Lines/Drains/Airways Status     Active Line/Drains/Airways     Name Placement date Placement time Site Days   Peripheral IV 03/05/23 20 G Anterior;Left Forearm 03/05/23  --  Forearm  less than 1   Peripheral IV 03/05/23 20 G Right Antecubital 03/05/23  1609  Antecubital  less than 1   Wound / Incision (Open or Dehisced) 12/23/22 Puncture Neck Right Venous access for TIPS 12/23/22  1050  Neck  72   Wound / Incision (Open or Dehisced) 12/23/22 Puncture Groin Anterior;Proximal;Right Venous access for  TIPS creation 12/23/22  1050  Groin  72            Intake/Output Last 24 hours  Intake/Output Summary (Last 24 hours) at 03/05/2023 1926 Last data filed at 03/05/2023 1857 Gross per 24 hour  Intake 500 ml  Output 350 ml  Net 150 ml    Labs/Imaging Results for orders placed or performed during the hospital encounter of 03/05/23 (from the past 48 hour(s))  Basic metabolic panel     Status: Abnormal   Collection Time: 03/05/23  3:49 PM  Result Value Ref Range   Sodium 136 135 - 145 mmol/L   Potassium 4.2 3.5 - 5.1 mmol/L   Chloride 103 98 - 111 mmol/L   CO2 23 22 - 32 mmol/L   Glucose, Bld 149 (H) 70 -  99 mg/dL    Comment: Glucose reference range applies only to samples taken after fasting for at least 8 hours.   BUN 18 8 - 23 mg/dL   Creatinine, Ser 1.61 (H) 0.44 - 1.00 mg/dL   Calcium 8.4 (L) 8.9 - 10.3 mg/dL   GFR, Estimated 42 (L) >60 mL/min    Comment: (NOTE) Calculated using the CKD-EPI Creatinine Equation (2021)    Anion gap 10 5 - 15    Comment: Performed at [facility], [address] [place], [place] [address]  Magnesium     Status: None   Collection Time: 03/05/23  3:49 PM  Result Value Ref Range   Magnesium 1.8 1.7 - 2.4 mg/dL    Comment: Performed at [facility], [address] [place], [place] [address]  Brain natriuretic peptide (order ONLY if patient c/o SOB)     Status: Abnormal   Collection Time: 03/05/23  3:49 PM  Result Value Ref Range   B Natriuretic Peptide 362.6 (H) 0.0 - 100.0 pg/mL    Comment: Performed at [facility], [address], [place], [place] [address]  CBC with Differential/Platelet     Status: Abnormal   Collection Time: 03/05/23  3:49 PM  Result Value Ref Range   WBC 7.4 4.0 - 10.5 K/uL   RBC 3.59 (L) 3.87 - 5.11 MIL/uL   Hemoglobin 11.7 (L) 12.0 - 15.0 g/dL   HCT 78.2 95.6 - 21.3 %   MCV 101.4 (H) 80.0 - 100.0 fL   MCH 32.6 26.0 - 34.0 pg   MCHC 32.1 30.0 - 36.0 g/dL   RDW 08.6 57.8 - 46.9 %   Platelets  30 (L) 150 - 400 K/uL    Comment: Immature Platelet Fraction may be clinically indicated, consider ordering this additional test [id number] REPEATED TO VERIFY PLATELET COUNT CONFIRMED BY SMEAR    nRBC 0.0 0.0 - 0.2 %   Neutrophils Relative % 91 %   Neutro Abs 6.7 1.7 - 7.7 K/uL   Lymphocytes Relative 3 %   Lymphs Abs 0.3 (L) 0.7 - 4.0 K/uL   Monocytes Relative 6 %   Monocytes Absolute 0.4 0.1 - 1.0 K/uL   Eosinophils Relative 0 %   Eosinophils Absolute 0.0 0.0 - 0.5 K/uL   Basophils Relative 0 %   Basophils Absolute 0.0 0.0 - 0.1 K/uL   Immature Granulocytes 0 %   Abs Immature Granulocytes 0.02 0.00 - 0.07 K/uL    Comment: Performed at [facility], [address], [place], [place] [address]  Protime-INR     Status: Abnormal   Collection Time: 03/05/23  3:49 PM  Result Value Ref Range   Prothrombin Time 15.8 (H) 11.4 - 15.2 seconds   INR 1.2 0.8 - 1.2    Comment: (NOTE) INR goal varies based on device and disease states. Performed at [facility], [address], [place], [place] [address]   Hepatic function panel     Status: Abnormal   Collection Time: 03/05/23  3:49 PM  Result Value Ref Range   Total Protein 5.9 (L) 6.5 - 8.1 g/dL   Albumin 2.7 (L) 3.5 - 5.0 g/dL   AST 42 (H) 15 - 41 U/L   ALT 21 0 - 44 U/L   Alkaline Phosphatase 71 38 - 126 U/L   Total Bilirubin 5.0 (H) 0.3 - 1.2 mg/dL   Bilirubin, Direct 0.7 (H) 0.0 - 0.2 mg/dL   Indirect Bilirubin 4.3 (  H) 0.3 - 0.9 mg/dL    Comment: Performed at [facility], [address] [place], [place] [address]  Lactic acid, plasma     Status: Abnormal   Collection Time: 03/05/23  4:29 PM  Result Value Ref Range   Lactic Acid, Venous 2.2 (HH) 0.5 - 1.9 mmol/L    Comment: CRITICAL RESULT CALLED TO, READ BACK BY AND VERIFIED WITH E,ANELLO RN @1834  03/05/23 E,BENTON Performed at [facility], [address] [place], [place] [address]   Urinalysis, Routine w reflex microscopic -Urine, Clean Catch      Status: Abnormal   Collection Time: 03/05/23  5:08 PM  Result Value Ref Range   Color, Urine STRAW (A) YELLOW   APPearance CLEAR CLEAR   Specific Gravity, Urine 1.006 1.005 - 1.030   pH 6.0 5.0 - 8.0   Glucose, UA 150 (A) NEGATIVE mg/dL   Hgb urine dipstick SMALL (A) NEGATIVE   Bilirubin Urine NEGATIVE NEGATIVE   Ketones, ur NEGATIVE NEGATIVE mg/dL   Protein, ur NEGATIVE NEGATIVE mg/dL   Nitrite NEGATIVE NEGATIVE   Leukocytes,Ua NEGATIVE NEGATIVE   RBC / HPF 0-5 0 - 5 RBC/hpf   WBC, UA 0-5 0 - 5 WBC/hpf   Bacteria, UA RARE (A) NONE SEEN   Squamous Epithelial / HPF 0-5 0 - 5 /HPF   Mucus PRESENT     Comment: Performed at [facility], [address], [place], [place] [address]   DG Chest Portable 1 View  Result Date: 03/05/2023 CLINICAL DATA:  Shortness of breath EXAM: PORTABLE CHEST 1 VIEW COMPARISON:  11/12/2022 FINDINGS: Stable cardiomegaly. Aortic atherosclerosis. No focal airspace consolidation, pleural effusion, or pneumothorax. IMPRESSION: Cardiomegaly without acute cardiopulmonary disease. Electronically Signed   By: Duanne Guess D.O.   On: 03/05/2023 17:00    Pending Labs Unresulted Labs (From admission, onward)     Start     Ordered   03/05/23 1629  Lactic acid, plasma  (Septic presentation on arrival (screening labs, nursing and treatment orders for obvious sepsis))  Now then every 2 hours,   R      03/05/23 1629   03/05/23 1629  Blood Culture (routine x 2)  (Septic presentation on arrival (screening labs, nursing and treatment orders for obvious sepsis))  BLOOD CULTURE X 2,   STAT      03/05/23 1629   Signed and Held  [employer] morning,   R        Signed and Held   Signed and [employer], blood  Tomorrow morning,   R        Signed and Held   Signed and Held  Procalcitonin  Tomorrow morning,   R       References:    Procalcitonin Lower Respiratory Tract Infection AND Sepsis Procalcitonin Algorithm   Signed and Held   Signed and Held   Comprehensive metabolic panel  Tomorrow morning,   R        Signed and Held   Signed and Held  CBC  Tomorrow morning,   R        Signed and Held            Vitals/Pain Today's Vitals   03/05/23 1800 03/05/23 1845 03/05/23 1900 03/05/23 1915  BP: (!) 94/59 117/74 109/77 (!) 90/35  Pulse: (!) 129 (!) 131 (!) 122 (!) 116  Resp: (!) 21 18 19  (!) 21  Temp:      TempSrc:  SpO2: 94% 94% 93% 94%  Weight:      Height:      PainSc:        Isolation Precautions No active isolations  Medications Medications  albuterol (VENTOLIN HFA) 108 (90 Base) MCG/ACT inhaler 2 puff (has no administration in time range)  cefTRIAXone (ROCEPHIN) 2 g in sodium chloride 0.9 % 100 mL IVPB (0 g Intravenous Stopped 03/05/23 1738)  diltiazem (CARDIZEM) 125 mg in dextrose 5% 125 mL (1 mg/mL) infusion (10 mg/hr Intravenous Rate/Dose Change 03/05/23 1917)  bumetanide (BUMEX) injection 2 mg (has no administration in time range)  lidocaine (LIDODERM) 5 % 1 patch (has no administration in time range)  fentaNYL (SUBLIMAZE) injection 50 mcg (0 mcg Intravenous Hold 03/05/23 1919)  ondansetron (ZOFRAN) injection 4 mg (4 mg Intravenous Given 03/05/23 1741)  sodium chloride 0.9 % bolus 500 mL (0 mLs Intravenous Stopped 03/05/23 1857)    Mobility walks with device     Focused Assessments Cardiac Assessment Handoff:  Cardiac Rhythm: Atrial fibrillation Lab Results  Component Value Date   TROPONINI <0.03 04/26/2018   Lab Results  Component Value Date   DDIMER 2.64 (H) 03/16/2020   Does the Patient currently have chest pain? No    R Recommendations: See Admitting Provider Note  Report given to:   Additional Notes: Patient on cardizem drip. Being titrated up due to high heart rate. BP low, but normal for patient.

## 2023-03-05 NOTE — ED Provider Notes (Signed)
[facility] EMERGENCY DEPARTMENT AT [facility] Provider Note   CSN: [id number] Arrival date & time: 03/05/23  1535     History  Chief Complaint  Patient presents with   Shortness of Breath    Hannah Khan is a 67 y.o. female.  HPI    67 year old  F with medical history significant of chronic HFpEF, morbid obesity, OSA, NASH/cirrhosis with chronic thrombocytopenia, IDDM comes in with chief complaint of shortness of breath.  She also complains of cellulitis to her left abdominal wall.  Patient states that about a week ago her medications were tweaked.  Ever since then she has been noticing slow weight gain.  She is now 14 pound weight gain.  She also has swelling/pitting edema in the lower extremity.  Last night she started getting short of breath and had to sleep in the recliner.  This morning her husband noted cellulitis in the left abdominal wall.  Patient has had cellulitis over there in the past.  She denies any fevers, chills.  Patient noted to be tachycardic and hypotensive.  She states that her blood pressure normally runs in the 80s.  With the new medication change, her pressure has been actually sometimes lower.    Home Medications Prior to Admission medications   Medication Sig Start Date End Date Taking? Authorizing Provider  albuterol (PROVENTIL) (2.5 MG/3ML) 0.083% nebulizer solution Take 3 mLs (2.5 mg total) by nebulization every 6 (six) hours as needed for wheezing or shortness of breath. Patient not taking: Reported on 02/14/2023 03/22/21   Noralee Stain, DO  albuterol (VENTOLIN HFA) 108 (90 Base) MCG/ACT inhaler Inhale 1-2 puffs into the lungs every 6 (six) hours as needed for wheezing or shortness of breath.    [provider]  Bacitracin-Polymyxin B (NEOSPORIN EX) Apply 1 Application topically daily as needed (wound Treatment).    [provider]  bumetanide (BUMEX) 2 MG tablet Bumex 2mg  on Sundays, Tuesdays, Thursdays, Saturdays  02/28/23   Alver Sorrow, NP  diclofenac Sodium (VOLTAREN) 1 % GEL Apply 1 Application topically at bedtime as needed (pain).    [provider]  diltiazem (CARDIZEM CD) 180 MG 24 hr capsule TAKE 1 CAPSULE BY MOUTH EVERY DAY Patient taking differently: Take 180 mg by mouth every evening. 10/20/22   Alver Sorrow, NP  empagliflozin (JARDIANCE) 25 MG TABS tablet Take 25 mg by mouth in the morning.    [provider]  lactulose (CHRONULAC) 10 GM/15ML solution Take 10 g by mouth 3 (three) times daily. 03/17/21   [provider]  Lancets (ONETOUCH DELICA PLUS [place]) MISC Apply topically 3 (three) times daily. 04/10/20   [provider]  metolazone (ZAROXOLYN) 5 MG tablet Take 1 tablet (5 mg total) by mouth once a week. Take one tablet on Sundays 30 minutes before Bumex 02/28/23   Alver Sorrow, NP  metoprolol succinate (TOPROL-XL) 100 MG 24 hr tablet Take 1 tablet (100 mg total) by mouth daily. 02/28/23   Alver Sorrow, NP  Multiple Vitamins-Minerals (PRESERVISION AREDS 2) CAPS Take 1 capsule by mouth 2 (two) times daily.    [provider]  [facility] VERIO test strip 1 each 3 (three) times daily. 04/13/20   [provider]  potassium chloride (MICRO-K) 10 MEQ CR capsule Take 40 mEq ( 4 tablets)  by mouth three times per day 02/28/23   Alver Sorrow, NP  Propylene Glycol (SYSTANE BALANCE) 0.6 % SOLN Place 1 drop into both eyes 2 (  two) times daily.    [provider]  Semaglutide, 2 MG/DOSE, (OZEMPIC, 2 MG/DOSE,) 8 MG/3ML SOPN Inject 2 mg into the skin once a week. Patient taking differently: Inject 2 mg into the skin every Thursday. 11/03/22   Alver Sorrow, NP  spironolactone (ALDACTONE) 25 MG tablet Monday, Wednesday and Friday- Take one tablet daily 02/28/23   Alver Sorrow, NP  XIFAXAN 550 MG TABS tablet Take 550 mg by mouth 2 (two) times daily. 03/13/21   [provider]      Allergies    Celecoxib,  Shellfish allergy, Barium-containing compounds, Prednisone, Statins, Fentanyl, Ibuprofen, Tramadol, Tylenol [acetaminophen], and Zetia [ezetimibe]    Review of Systems   Review of Systems  All other systems reviewed and are negative.   Physical Exam Updated Vital Signs BP 117/74   Pulse (!) 131   Temp 99 F (37.2 C) (Oral)   Resp 18   Ht 5\' 7"  (1.702 m)   Wt (!) 157.5 kg   SpO2 94%   BMI 54.38 kg/m  Physical Exam Vitals and nursing note reviewed.  Constitutional:      Appearance: She is well-developed.  HENT:     Head: Atraumatic.  Cardiovascular:     Rate and Rhythm: Tachycardia present. Rhythm irregular.  Pulmonary:     Effort: Pulmonary effort is normal.     Breath sounds: No decreased breath sounds.     Comments: Body habitus limits physical exam, no rales appreciated  Musculoskeletal:     Cervical back: Neck supple.     Right lower leg: Edema present.     Left lower leg: Edema present.     Comments: 2+ pitting edema  Skin:    General: Skin is warm and dry.  Neurological:     Mental Status: She is alert and oriented to person, place, and time.     ED Results / Procedures / Treatments   Labs (all labs ordered are listed, but only abnormal results are displayed) Labs Reviewed  BASIC METABOLIC PANEL - Abnormal; Notable for the following components:      Result Value   Glucose, Bld 149 (*)    Creatinine, Ser 1.38 (*)    Calcium 8.4 (*)    GFR, Estimated 42 (*)    All other components within normal limits  BRAIN NATRIURETIC PEPTIDE - Abnormal; Notable for the following components:   B Natriuretic Peptide 362.6 (*)    All other components within normal limits  CBC WITH DIFFERENTIAL/PLATELET - Abnormal; Notable for the following components:   RBC 3.59 (*)    Hemoglobin 11.7 (*)    MCV 101.4 (*)    Platelets 30 (*)    Lymphs Abs 0.3 (*)    All other components within normal limits  PROTIME-INR - Abnormal; Notable for the following components:   Prothrombin  Time 15.8 (*)    All other components within normal limits  LACTIC ACID, PLASMA - Abnormal; Notable for the following components:   Lactic Acid, Venous 2.2 (*)    All other components within normal limits  URINALYSIS, ROUTINE W REFLEX MICROSCOPIC - Abnormal; Notable for the following components:   Color, Urine STRAW (*)    Glucose, UA 150 (*)    Hgb urine dipstick SMALL (*)    Bacteria, UA RARE (*)    All other components within normal limits  HEPATIC FUNCTION PANEL - Abnormal; Notable for the following components:   Total Protein 5.9 (*)    Albumin 2.7 (*)  AST 42 (*)    Total Bilirubin 5.0 (*)    Bilirubin, Direct 0.7 (*)    Indirect Bilirubin 4.3 (*)    All other components within normal limits  CULTURE, BLOOD (ROUTINE X 2)  CULTURE, BLOOD (ROUTINE X 2)  MAGNESIUM  LACTIC ACID, PLASMA    EKG EKG Interpretation  Date/Time:  Sunday March 05 2023 15:36:48 EDT Ventricular Rate:  133 PR Interval:    QRS Duration: 87 QT Interval:  296 QTC Calculation: 441 R Axis:   -66 Text Interpretation: Atrial fibrillation Left anterior fascicular block Low voltage, precordial leads Abnormal R-wave progression, late transition AF with RVR noted Confirmed by Derwood Kaplan [phone]) on 03/05/2023 5:46:54 PM  Radiology DG Chest Portable 1 View  Result Date: 03/05/2023 CLINICAL DATA:  Shortness of breath EXAM: PORTABLE CHEST 1 VIEW COMPARISON:  11/12/2022 FINDINGS: Stable cardiomegaly. Aortic atherosclerosis. No focal airspace consolidation, pleural effusion, or pneumothorax. IMPRESSION: Cardiomegaly without acute cardiopulmonary disease. Electronically Signed   By: Duanne Guess D.O.   On: 03/05/2023 17:00    Procedures .Critical Care  Performed by: Derwood Kaplan, MD Authorized by: Derwood Kaplan, MD   Critical care provider statement:    Critical care time (minutes):  77   Critical care was necessary to treat or prevent imminent or life-threatening deterioration of the following  conditions:  Circulatory failure, respiratory failure, cardiac failure and hepatic failure   Critical care was time spent personally by me on the following activities:  Development of treatment plan with patient or surrogate, discussions with consultants, evaluation of patient's response to treatment, examination of patient, ordering and review of laboratory studies, ordering and review of radiographic studies, ordering and performing treatments and interventions, pulse oximetry, re-evaluation of patient's condition and review of old charts     Medications Ordered in ED Medications  albuterol (VENTOLIN HFA) 108 (90 Base) MCG/ACT inhaler 2 puff (has no administration in time range)  cefTRIAXone (ROCEPHIN) 2 g in sodium chloride 0.9 % 100 mL IVPB (0 g Intravenous Stopped 03/05/23 1738)  diltiazem (CARDIZEM) 125 mg in dextrose 5% 125 mL (1 mg/mL) infusion (5 mg/hr Intravenous New Bag/Given 03/05/23 1837)  bumetanide (BUMEX) injection 2 mg (has no administration in time range)  lidocaine (LIDODERM) 5 % 1 patch (has no administration in time range)  ondansetron (ZOFRAN) injection 4 mg (4 mg Intravenous Given 03/05/23 1741)  sodium chloride 0.9 % bolus 500 mL (0 mLs Intravenous Stopped 03/05/23 1857)    ED Course/ Medical Decision Making/ A&P                             Medical Decision Making Amount and/or Complexity of Data Reviewed Labs: ordered. Radiology: ordered. ECG/medicine tests: ordered.  Risk Prescription drug management. Decision regarding hospitalization.  This patient presents to the ED with chief complaint(s) of shortness of breath, worsening leg swelling and increased weight gain along with left abdominal wall redness with pertinent past medical history of Elita Boone liver cirrhosis, CHF, atrial fibrillation, thrombocytopenia.The complaint involves an extensive differential diagnosis and also carries with it a high risk of complications and morbidity.    Patient noted to be in A-fib  with RVR at arrival.  She had received 10 mg of diltiazem bolus by EMS.  Patient's blood pressure also in the 80s systolic. The differential diagnosis includes severe sepsis from cellulitis causing A-fib with RVR, acute congestive heart failure, decompensated CHF secondary to A-fib with RVR, severe anemia, severe electrolyte abnormality,  low albumin state and anasarca.  The initial plan is to get basic labs, initiate sepsis workup.  I have reviewed patient's records.  Patient indeed had blood pressure systolic in the 80s when she saw cardiology.  Patient has a list of her blood pressures for the last week, and she primarily has been in the 70s and 80s systolic.  She states that her blood pressure normally is in the 80s and 90s.  My suspicion is low for her being septic shock.  Likely she is always hypotensive.   Additional history obtained: Additional history obtained from family and EMS  Records reviewed previous admission documents and previous note from cardiology  Independent labs interpretation:  The following labs were independently interpreted: Lactic acid is elevated 2.2. CBC shows no leukocytosis, BNP is 326.  Patient's albumin is 2.7, which appears to be baseline.  Bilirubin however is at 5, much higher than her baseline.  Independent visualization and interpretation of imaging: - I independently visualized the following imaging with scope of interpretation limited to determining acute life threatening conditions related to emergency care: X-ray of the chest, which revealed findings consistent with interstitial edema  Treatment and Reassessment: Patient reassessed after her workup. Blood pressure has stabilized.  She has received finer cc bolus for low blood pressure.  BP has responded.  We will start her on diltiazem drip for her A-fib with RVR.  My hope is that she will auto diurese when her rate is better controlled.  We will give her 2 mg IV Bumex right now.  She has  already received IV antibiotics.  Consultation: - Consulted or discussed management/test interpretation with external professional: Cardiology consulted to comanage.  I have also consulted palliative service, as patient has significant comorbidity burden, and overall has poor prognosis long-term.  Dr. Linna Darner said that she will have her team see her. Dr. Orson Aloe from cardiology will also see the patient.   Final Clinical Impression(s) / ED Diagnoses Final diagnoses:  Atrial fibrillation with RVR (HCC)  Acute diastolic congestive heart failure (HCC)  Severe sepsis (HCC)  Cellulitis of abdominal wall    Rx / DC Orders ED Discharge Orders     None         Derwood Kaplan, MD 03/05/23 1912

## 2023-03-05 NOTE — ED Notes (Signed)
Reached out to provider for pain medication. Patient repositioned for comfort.

## 2023-03-05 NOTE — ED Notes (Signed)
Progressive floor noted to have refused patient based on heart rate and BP. Explained to RN Kathlene November on 3E that her BP is soft at baseline, according to her BP log she keeps from home. Was advised by [facility] through charge RN Tresa Endo that if she came out of a red MEWS score, she would be accepted into progressive, if not she needed an ICU assignment. As of now, she remains in red MEWS score.

## 2023-03-05 NOTE — ED Notes (Signed)
MD at bedside. 

## 2023-03-05 NOTE — ED Notes (Addendum)
Pt tearful and repositioned. Pt educated about the importance of staying bed due to her tachycardia and hypotension with shortness of breath. Patient still requesting to get out of bed, but willing to attempt to reposition again. Pt also expresses "I am annoyed with all of the beeping from the machine."

## 2023-03-05 NOTE — ED Notes (Signed)
Pt having an episode of projectile vomiting. Pt cleaned, gown changed, see MAR.

## 2023-03-05 NOTE — ED Notes (Signed)
Spoke face to face with Dr. Mikeal Hawthorne regarding IP concerns with her BP, he reiterated that since this is her normal, no interventions will need to be taken, and progressive care is appropriate as to not unnecessarily occupy an ICU bed. Following that conversation, I contacted 3E charge RN and spoke with her, and she agreed that patient would be okay to come up. Patient placement called and notified, to reenter bed assignment. Patient resting and in NAD at this time.

## 2023-03-05 NOTE — ED Notes (Signed)
Informed RN of monitor alerts ?

## 2023-03-05 NOTE — ED Notes (Signed)
Spoke with Dr. Mikeal Hawthorne regarding patient's blood pressure. I advised I was going to hold bumex as we had to titrate Cardizem drip up. He agreed, and advised he would upgrade her to progressive if necessary.

## 2023-03-05 NOTE — ED Notes (Signed)
Pt tearful and unable to be comforted at this time. Speaking with provider for pain medication.

## 2023-03-05 NOTE — ED Notes (Signed)
Patient has had multiple episodes of incontinence, pt placed on purewick. Linen and gown changed.

## 2023-03-05 NOTE — Sepsis Progress Note (Signed)
Secure chatted provider for an order for a second lactic acid, since first one was elevated at 2.2. Awaiting orders.   Patient only received of fluids. Has a history of CHF.   Will continue to monitor code sepsis.

## 2023-03-06 ENCOUNTER — Inpatient Hospital Stay (HOSPITAL_COMMUNITY): Payer: Medicare PPO

## 2023-03-06 DIAGNOSIS — I4891 Unspecified atrial fibrillation: Secondary | ICD-10-CM | POA: Diagnosis not present

## 2023-03-06 DIAGNOSIS — K7581 Nonalcoholic steatohepatitis (NASH): Secondary | ICD-10-CM

## 2023-03-06 DIAGNOSIS — I5031 Acute diastolic (congestive) heart failure: Secondary | ICD-10-CM

## 2023-03-06 DIAGNOSIS — I950 Idiopathic hypotension: Secondary | ICD-10-CM | POA: Diagnosis not present

## 2023-03-06 DIAGNOSIS — L0390 Cellulitis, unspecified: Secondary | ICD-10-CM | POA: Diagnosis not present

## 2023-03-06 DIAGNOSIS — I9589 Other hypotension: Secondary | ICD-10-CM

## 2023-03-06 DIAGNOSIS — I5033 Acute on chronic diastolic (congestive) heart failure: Secondary | ICD-10-CM

## 2023-03-06 DIAGNOSIS — Z7189 Other specified counseling: Secondary | ICD-10-CM

## 2023-03-06 DIAGNOSIS — I4821 Permanent atrial fibrillation: Secondary | ICD-10-CM

## 2023-03-06 DIAGNOSIS — K7460 Unspecified cirrhosis of liver: Secondary | ICD-10-CM

## 2023-03-06 DIAGNOSIS — A419 Sepsis, unspecified organism: Secondary | ICD-10-CM | POA: Diagnosis not present

## 2023-03-06 DIAGNOSIS — L03311 Cellulitis of abdominal wall: Secondary | ICD-10-CM | POA: Diagnosis not present

## 2023-03-06 LAB — GLUCOSE, CAPILLARY
Glucose-Capillary: 148 mg/dL — ABNORMAL HIGH (ref 70–99)
Glucose-Capillary: 119 mg/dL — ABNORMAL HIGH (ref 70–99)
Glucose-Capillary: 139 mg/dL — ABNORMAL HIGH (ref 70–99)
Glucose-Capillary: 128 mg/dL — ABNORMAL HIGH (ref 70–99)

## 2023-03-06 LAB — COMPREHENSIVE METABOLIC PANEL
ALT: 22 U/L (ref 0–44)
AST: 35 U/L (ref 15–41)
Albumin: 2.3 g/dL — ABNORMAL LOW (ref 3.5–5.0)
Alkaline Phosphatase: 67 U/L (ref 38–126)
Anion gap: 12 (ref 5–15)
BUN: 22 mg/dL (ref 8–23)
CO2: 22 mmol/L (ref 22–32)
Calcium: 8.1 mg/dL — ABNORMAL LOW (ref 8.9–10.3)
Chloride: 100 mmol/L (ref 98–111)
Creatinine, Ser: 1.59 mg/dL — ABNORMAL HIGH (ref 0.44–1.00)
GFR, Estimated: 35 mL/min — ABNORMAL LOW (ref >60)
Glucose, Bld: 157 mg/dL — ABNORMAL HIGH (ref 70–99)
Potassium: 3.3 mmol/L — ABNORMAL LOW (ref 3.5–5.1)
Sodium: 134 mmol/L — ABNORMAL LOW (ref 135–145)
Total Bilirubin: 4.8 mg/dL — ABNORMAL HIGH (ref 0.3–1.2)
Total Protein: 5.4 g/dL — ABNORMAL LOW (ref 6.5–8.1)

## 2023-03-06 LAB — CBC
HCT: 33.5 % — ABNORMAL LOW (ref 36.0–46.0)
Hemoglobin: 11.2 g/dL — ABNORMAL LOW (ref 12.0–15.0)
MCH: 33.7 pg (ref 26.0–34.0)
MCHC: 33.4 g/dL (ref 30.0–36.0)
MCV: 100.9 fL — ABNORMAL HIGH (ref 80.0–100.0)
Platelets: 20 10*3/uL — CL (ref 150–400)
RBC: 3.32 MIL/uL — ABNORMAL LOW (ref 3.87–5.11)
RDW: 15.7 % — ABNORMAL HIGH (ref 11.5–15.5)
WBC: 7.3 10*3/uL (ref 4.0–10.5)
nRBC: 0 % (ref 0.0–0.2)

## 2023-03-06 LAB — PROTIME-INR
INR: 1.3 — ABNORMAL HIGH (ref 0.8–1.2)
Prothrombin Time: 16.3 seconds — ABNORMAL HIGH (ref 11.4–15.2)

## 2023-03-06 LAB — CULTURE, BLOOD (ROUTINE X 2): Culture: NO GROWTH

## 2023-03-06 LAB — LACTIC ACID, PLASMA: Lactic Acid, Venous: 2 mmol/L (ref 0.5–1.9)

## 2023-03-06 LAB — PROCALCITONIN: Procalcitonin: 0.76 ng/mL

## 2023-03-06 LAB — CORTISOL-AM, BLOOD: Cortisol - AM: 13.3 ug/dL (ref 6.7–22.6)

## 2023-03-06 MED ORDER — DIGOXIN 125 MCG PO TABS
0.1250 mg | ORAL_TABLET | Freq: Every day | ORAL | Status: DC
  Administered 2023-03-07 – 2023-03-11 (×5): 0.125 mg via ORAL
  Filled 2023-03-06 (×5): qty 1

## 2023-03-06 MED ORDER — MIDODRINE HCL 5 MG PO TABS
10.0000 mg | ORAL_TABLET | Freq: Three times a day (TID) | ORAL | Status: DC
  Administered 2023-03-06 – 2023-03-12 (×19): 10 mg via ORAL
  Filled 2023-03-06 (×19): qty 2

## 2023-03-06 MED ORDER — ALBUMIN HUMAN 25 % IV SOLN
25.0000 g | Freq: Four times a day (QID) | INTRAVENOUS | Status: AC
  Administered 2023-03-06 (×3): 25 g via INTRAVENOUS
  Filled 2023-03-06 (×3): qty 100

## 2023-03-06 MED ORDER — CEFAZOLIN SODIUM-DEXTROSE 2-4 GM/100ML-% IV SOLN
2.0000 g | Freq: Three times a day (TID) | INTRAVENOUS | Status: DC
  Administered 2023-03-06 – 2023-03-10 (×12): 2 g via INTRAVENOUS
  Filled 2023-03-06 (×12): qty 100

## 2023-03-06 MED ORDER — POTASSIUM CHLORIDE CRYS ER 20 MEQ PO TBCR
40.0000 meq | EXTENDED_RELEASE_TABLET | Freq: Once | ORAL | Status: AC
  Administered 2023-03-06: 40 meq via ORAL
  Filled 2023-03-06: qty 2

## 2023-03-06 MED ORDER — DIGOXIN 0.25 MG/ML IJ SOLN
0.2500 mg | Freq: Once | INTRAMUSCULAR | Status: AC
  Administered 2023-03-06: 0.25 mg via INTRAVENOUS
  Filled 2023-03-06: qty 2

## 2023-03-06 MED ORDER — RIFAXIMIN 550 MG PO TABS
550.0000 mg | ORAL_TABLET | Freq: Two times a day (BID) | ORAL | Status: DC
  Administered 2023-03-06 – 2023-03-12 (×13): 550 mg via ORAL
  Filled 2023-03-06 (×14): qty 1

## 2023-03-06 NOTE — Progress Notes (Signed)
Date and time results received: 03/06/23 0300  Test:  Platelet count  Critical Value: 20  Name of Provider Notified: Dow Adolph, DO

## 2023-03-06 NOTE — Consult Note (Signed)
Cardiology Consultation   Patient ID: Hannah Khan MRN: [id number]; DOB: 09-22-1955  Admit date: 03/05/2023 Date of Consult: 03/06/2023  PCP:  Darrow Bussing, MD   [facility] Providers Cardiologist:  Chilton Si, MD        Patient Profile:   Hannah Khan is a 67 y.o. female with a history of heart failure with preserved ejection fraction, persistent atrial fibrillation, sleep apnea, morbid obesity, cirrhosis complicated by portal hypertension, primary hypertension, and non-insulin dependent diabetes,  who is being seen 03/06/2023 for the evaluation of shortness of breath at the request of Dr. Mikeal Hawthorne.  History of Present Illness:   Hannah Khan states for the past six days, she has noticed increasing weight for which she gained a total of 14 lbs.  Within the last two days, she has been having shortness of breath mostly with exertion that's slowly gotten worse.  Last night, she was unable to lay in her bed and had to sleep in a recliner due to feeling short of breath when lying down in her bed.  She also noticed waking up due to shortness of breath last night as well.  Due to worsening shortness of breath, she had her husband drive her to our emergency department for further evaluation.  During this time, she has also noticed swelling in both of her legs that was new from prior.  She states that she has been taking her bumetanide 2mg  as prescribed -- one tablet four times a week.  She is also taking metolazone 5mg  one a week.  Patient reports intermittent chest pain one week ago that she describes as an ache at her right chest that lasted less than 15 seconds.  The chest pain was sporadic and not associated with any alleviating or aggravating factors.  Patient also reports feeling intermittent dizziness at home with low blood pressures (lowest systolic blood pressure reportedly 65 mmHg).  Dizziness was usually brought on by getting up to stand and she did have one brief episode  for which she felt like she was going to faint.     In the emergency room, it was noted patient was in atrial fibrillation with rapid ventricular rate for which she was placed on diltiazem drip.  Furthermore, erythema was noted at her left lower abdomen that was concerning for cellulitis.    Past Medical History:  Diagnosis Date   Anemia    hx of   Arthritis    knees   CHF (congestive heart failure) (HCC)    chronic diastolic   Diabetes mellitus without complication (HCC)    type 2   Dyspnea    Dysrhythmia    Fibromyalgia    Gastric varices    large   H/O transfusion of platelets    Heart murmur    never has caused any problems   Hx of colonic polyps    s/p partial colectomy   Hyperlipidemia    Hypertension    Joint pain    in knees and back spasms   Left leg swelling    wear compression hose   Liver cirrhosis secondary to NASH (nonalcoholic steatohepatitis) (HCC) dx nov 2014   had enlarged spleen also    Morbid obesity (HCC)    Persistent atrial fibrillation (HCC)    Pneumonia 10/2016   x 5   PONV (postoperative nausea and vomiting)    in past none recent   Portal hypertension (HCC)    Sleep apnea    uses cpap,  setting varies between 14-20   Spleen enlarged    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 [facility])     Past Surgical History:  Procedure Laterality Date   BREAST LUMPECTOMY WITH RADIOACTIVE SEED LOCALIZATION Right 08/29/2017   Procedure: RIGHT BREAST LUMPECTOMY WITH RADIOACTIVE SEEDS LOCALIZATION;  Surgeon: Harriette Bouillon, MD;  Location: [facility];  Service: General;  Laterality: Right;   CESAREAN SECTION  1989   CHOLECYSTECTOMY  20 yrs ago   COLECTOMY  2011   COLONOSCOPY     COLONOSCOPY N/A 09/05/2018   Procedure: COLONOSCOPY;  Surgeon: Willis Modena, MD;  Location: [facility];  Service: Endoscopy;  Laterality: N/A;   COLONOSCOPY WITH PROPOFOL Bilateral 05/04/2022   Procedure: COLONOSCOPY WITH PROPOFOL;  Surgeon: Willis Modena, MD;  Location: [facility]  ENDOSCOPY;  Service: Gastroenterology;  Laterality: Bilateral;   DILATATION & CURETTAGE/HYSTEROSCOPY WITH MYOSURE N/A 05/06/2016   Procedure: DILATATION & CURETTAGE/HYSTEROSCOPY WITH MYOSURE WITH POLYPECTOMY;  Surgeon: Myna Hidalgo, DO;  Location: [facility] ORS;  Service: Gynecology;  Laterality: N/A;   ESOPHAGOGASTRODUODENOSCOPY (EGD) WITH PROPOFOL N/A 09/04/2013   Procedure: ESOPHAGOGASTRODUODENOSCOPY (EGD) WITH PROPOFOL;  Surgeon: Willis Modena, MD;  [facility];  Service: Endoscopy;  Laterality: N/A;   ESOPHAGOGASTRODUODENOSCOPY (EGD) WITH PROPOFOL Bilateral 05/04/2022   Procedure: ESOPHAGOGASTRODUODENOSCOPY (EGD) WITH PROPOFOL;  Surgeon: Willis Modena, MD;  [facility];  Service: Gastroenterology;  Laterality: Bilateral;   IR INTRAVASCULAR ULTRASOUND NON CORONARY  12/23/2022   IR RADIOLOGIST EVAL & MGMT  09/21/2022   IR RADIOLOGIST EVAL & MGMT  10/06/2022   IR RADIOLOGIST EVAL & MGMT  11/02/2022   IR RADIOLOGIST EVAL & MGMT  01/31/2023   IR TIPS  12/23/2022   IR US GUIDE VASC ACCESS RIGHT  12/23/2022   KNEE ARTHROSCOPY  yrs ago   bilateral, one done x 1, one done twice   POLYPECTOMY  09/05/2018   Procedure: POLYPECTOMY;  Surgeon: Willis Modena, MD;  [facility]: [facility];  Service: Endoscopy;;   RADIOLOGY WITH ANESTHESIA N/A 12/23/2022   Procedure: TIPS;  Surgeon: Bennie Dallas, MD;  Location: [facility] OR;  Service: Radiology;  Laterality: N/A;     Home Medications:  Prior to Admission medications   Medication Sig Start Date End Date Taking? Authorizing Provider  bumetanide (BUMEX) 2 MG tablet Bumex 2mg  on Sundays, Tuesdays, Thursdays, Saturdays 02/28/23  Yes Alver Sorrow, NP  diclofenac Sodium (VOLTAREN) 1 % GEL Apply 1 Application topically at bedtime as needed (pain).   Yes [provider]  diltiazem (CARDIZEM CD) 180 MG 24 hr capsule TAKE 1 CAPSULE BY MOUTH EVERY DAY Patient taking differently: Take 180 mg by mouth every evening. 10/20/22  Yes Alver Sorrow, NP   empagliflozin (JARDIANCE) 25 MG TABS tablet Take 25 mg by mouth in the morning.   Yes [provider]  lactulose (CHRONULAC) 10 GM/15ML solution Take 10 g by mouth 3 (three) times daily. 03/17/21  Yes [provider]  metolazone (ZAROXOLYN) 5 MG tablet Take 1 tablet (5 mg total) by mouth once a week. Take one tablet on Sundays 30 minutes before Bumex 02/28/23  Yes Alver Sorrow, NP  metoprolol succinate (TOPROL-XL) 100 MG 24 hr tablet Take 1 tablet (100 mg total) by mouth daily. 02/28/23  Yes Alver Sorrow, NP  Multiple Vitamins-Minerals (PRESERVISION AREDS 2) CAPS Take 1 capsule by mouth 2 (two) times daily.   Yes [provider]  potassium chloride (MICRO-K) 10 MEQ CR capsule Take 40 mEq ( 4 tablets)  by mouth three times per day 02/28/23  Yes Alver Sorrow, NP  Propylene Glycol (SYSTANE BALANCE) 0.6 % SOLN Place 1 drop into both eyes 2 (two) times daily.   Yes [provider]  Semaglutide, 2 MG/DOSE, (OZEMPIC, 2 MG/DOSE,) 8 MG/3ML SOPN Inject 2 mg into the skin once a week. Patient taking differently: Inject 2 mg into the skin every Thursday. 11/03/22  Yes Alver Sorrow, NP  spironolactone (ALDACTONE) 25 MG tablet Monday, Wednesday and Friday- Take one tablet daily 02/28/23  Yes Alver Sorrow, NP  XIFAXAN 550 MG TABS tablet Take 550 mg by mouth 2 (two) times daily. 03/13/21  Yes [provider]  albuterol (PROVENTIL) (2.5 MG/3ML) 0.083% nebulizer solution Take 3 mLs (2.5 mg total) by nebulization every 6 (six) hours as needed for wheezing or shortness of breath. Patient not taking: Reported on 02/14/2023 03/22/21   Noralee Stain, DO  albuterol (VENTOLIN HFA) 108 (90 Base) MCG/ACT inhaler Inhale 1-2 puffs into the lungs every 6 (six) hours as needed for wheezing or shortness of breath.    [provider]  Lancets [facility] DELICA PLUS [place]) MISC Apply topically 3 (three) times daily. 04/10/20   [provider]  [facility]  VERIO test strip 1 each 3 (three) times daily. 04/13/20   [provider]    Inpatient Medications: Scheduled Meds:  bumetanide (BUMEX) IV  2 mg Intravenous Once   insulin aspart  0-20 Units Subcutaneous TID WC   insulin aspart  0-5 Units Subcutaneous QHS   lactulose  10 g Oral TID   lidocaine  1 patch Transdermal Q24H   metoprolol succinate  100 mg Oral Daily   Continuous Infusions:  cefTRIAXone (ROCEPHIN)  IV Stopped (03/05/23 1738)   diltiazem (CARDIZEM) infusion 10 mg/hr (03/05/23 1917)   PRN Meds: acetaminophen, albuterol, morphine injection, ondansetron **OR** ondansetron (ZOFRAN) IV  Allergies:    Allergies  Allergen Reactions   Celecoxib Other (See Comments)    "speech slurred" with celebrex   Shellfish Allergy Swelling and Rash    TINGLING AND SWELLING OF LIPS. LARGE WHELPS QUARTER-SIZE   Barium-Containing Compounds Other (See Comments)    TACHYCARDIA   Prednisone Other (See Comments)    TACHYCARDIA   Statins Other (See Comments)    AGGRAVATED FIBROMYALGIA   Fentanyl     Hallucinations    Ibuprofen Other (See Comments)    UNSPECIFIED SPECIFIC REACTION >> "DUE TO PLATELETS"   Tramadol     Hallucination    Tylenol [Acetaminophen] Other (See Comments)    UNSPECIFIED SPECIFIC REACTION >> "DUE TO PLATELETS"   Zetia [Ezetimibe]     Headache    Social History:   Social History   Socioeconomic History   Marital status: Married    Spouse name: Not on file   Number of children: Not on file   Years of education: Not on file   Highest education level: Not on file  Occupational History   Occupation: Scientist, product/process development  Tobacco Use   Smoking status: Former    Packs/day: 1.00    Years: 3.00    Additional pack years: 0.00    Total pack years: 3.00    Types: Cigarettes    Quit date: 09/19/1974    Years since quitting: 48.4   Smokeless tobacco: Never  Vaping Use   Vaping Use: Never used  Substance and Sexual Activity   Alcohol use: Not Currently     Comment: occ   Drug use: No   Sexual activity: Not on file  Other Topics Concern  Not on file  Social History Narrative   Lives in [place] with her husband.  Instructor for early childhood development.     Social Determinants of Health   Financial Resource Strain: Not on file  Food Insecurity: No Food Insecurity (03/05/2023)   Hunger Vital Sign    Worried About Running Out of Food in the Last Year: Never true    Ran Out of Food in the Last Year: Never true  Transportation Needs: No Transportation Needs (03/05/2023)   PRAPARE - Administrator, Civil Service (Medical): No    Lack of Transportation (Non-Medical): No  Physical Activity: Not on file  Stress: Not on file  Social Connections: Not on file  Intimate Partner Violence: Not At Risk (03/05/2023)   Humiliation, Afraid, Rape, and Kick questionnaire    Fear of Current or Ex-Partner: No    Emotionally Abused: No    Physically Abused: No    Sexually Abused: No    Family History:   Family History  Problem Relation Age of Onset   Heart failure Mother    Cancer Mother    Hyperlipidemia Mother    Congestive Heart Failure Mother    Stroke Mother    Lung cancer Father    Cancer Father    Cancer Brother    Hyperlipidemia Brother    Stroke Other      ROS:  Please see the history of present illness.  All other ROS reviewed and negative.     Physical Exam/Data:   Vitals:   03/05/23 2236 03/05/23 2330 03/05/23 2335 03/06/23 0002  BP: (!) 88/53 (!) 73/38 (!) 82/41 (!) 86/39  Pulse: (!) 121 (!) 109 (!) 118 81  Resp: 17 20 19 15   Temp:    98.6 F (37 C)  TempSrc:    Oral  SpO2: 95% 94% 95% 94%  Weight:    (!) 156.3 kg  Height:    5\' 7"  (1.702 m)    Intake/Output Summary (Last 24 hours) at 03/06/2023 0049 Last data filed at 03/05/2023 1857 Gross per 24 hour  Intake 500 ml  Output 350 ml  Net 150 ml      03/06/2023   12:02 AM 03/05/2023    3:37 PM 02/14/2023    1:43 PM  Last 3 Weights  Weight (lbs)  344 lb 9.6 oz 347 lb 3.2 oz 324 lb  Weight (kg) 156.31 kg 157.489 kg 146.965 kg     Body mass index is 53.97 kg/m.  General:  Well nourished, obese, well developed, in no acute distress HEENT: normal Neck: JVD cannot be assessed due to body habitus Vascular: No carotid bruits; Distal pulses 2+ bilaterally Cardiac:  normal S1, S2; irregular; 2/6 crescendo murmur heard best LLSB. Lungs:  clear to auscultation bilaterally, no wheezing, rhonchi or rales  Abd: soft, nontender, no hepatomegaly  Ext: 2+ bilateral pitting edema Musculoskeletal:  No deformities, BUE and BLE strength normal and equal Skin: warm and dry  Neuro:  CNs 2-12 intact, no focal abnormalities noted Psych:  Normal affect   EKG:  The EKG was personally reviewed and demonstrates:  02/23/23 (15:36):  atrial fibrillation with rapid ventricular rate; LAFB  Telemetry:  Telemetry was personally reviewed and demonstrates:  atrial fibrillation with rapid ventricular rate  Relevant CV Studies: # Echocardiogram 11/01/22: IMPRESSIONS   1. Left ventricular ejection fraction, by estimation, is 60 to 65%. The  left ventricle has normal function. The left ventricle has no regional  wall motion abnormalities.  Left ventricular diastolic function could not  be evaluated.   2. Right ventricular systolic function is normal. The right ventricular  size is normal. There is normal pulmonary artery systolic pressure.   3. Left atrial size was mildly dilated.   4. The mitral valve is normal in structure. Trivial mitral valve  regurgitation. No evidence of mitral stenosis.   5. The aortic valve was not well visualized. Aortic valve regurgitation  is not visualized. No aortic stenosis is present.   6. The inferior vena cava is normal in size with greater than 50%  respiratory variability, suggesting right atrial pressure of 3 mmHg.   Laboratory Data:  High Sensitivity Troponin:  No results for input(s): "TROPONINIHS" in the last 720 hours.    Chemistry Recent Labs  Lab 02/27/23 1108 03/05/23 1549  NA 141 136  K 2.9* 4.2  CL 101 103  CO2 25 23  GLUCOSE 129* 149*  BUN 17 18  CREATININE 1.21* 1.38*  CALCIUM 8.8 8.4*  MG  --  1.8  GFRNONAA  --  42*  ANIONGAP  --  10    Recent Labs  Lab 03/05/23 1549  PROT 5.9*  ALBUMIN 2.7*  AST 42*  ALT 21  ALKPHOS 71  BILITOT 5.0*   Lipids No results for input(s): "CHOL", "TRIG", "HDL", "LABVLDL", "LDLCALC", "CHOLHDL" in the last 168 hours.  Hematology Recent Labs  Lab 03/05/23 1549  WBC 7.4  RBC 3.59*  HGB 11.7*  HCT 36.4  MCV 101.4*  MCH 32.6  MCHC 32.1  RDW 15.5  PLT 30*   Thyroid No results for input(s): "TSH", "FREET4" in the last 168 hours.  BNP Recent Labs  Lab 03/05/23 1549  BNP 362.6*    DDimer No results for input(s): "DDIMER" in the last 168 hours.   Radiology/Studies:  DG Chest Portable 1 View  Result Date: 03/05/2023 CLINICAL DATA:  Shortness of breath EXAM: PORTABLE CHEST 1 VIEW COMPARISON:  11/12/2022 FINDINGS: Stable cardiomegaly. Aortic atherosclerosis. No focal airspace consolidation, pleural effusion, or pneumothorax. IMPRESSION: Cardiomegaly without acute cardiopulmonary disease. Electronically Signed   By: Duanne Guess D.O.   On: 03/05/2023 17:00     Assessment and Plan:   Dyspnea: Patient with progressive dyspnea likely multifactorial (acute heart failure syndrome and atrial fibrillation with rapid ventricular rate).  Noted hemodynamic congestion on examination.  Heart rate improved with IV diltiazem.  Given one time dose of IV bumetanide injection 2 mg. --Please start IV bumetanide 2 mg with initial daily dose for now, goal net negative 1-2L and 0.5 kg weight loss.  Daily weights. --Reasonable to transition to home diltiazem (lower dose) and metoprolol succinate for atrial fibrillation management, given hypotension concerns and IV diltiazem.    Heart failure with preserved ejection fraction: Patient with presumed heart failure  with preserved ejection fraction now with acute heart failure syndrome.  Patient was taking empagliflozin and spironolactone prior.  Spironolactone being held in the setting of worsening renal function.  Currently receiving IV diuresis. --Hold spironolactone. --IV diuresis as above.    Hypotension: Patient with noted hypotension with noted elevated lactic acid and acute renal failure.  Unclear etiology but possibly iatrogenic with multiple blood pressure lowering medications.   --Hold IV diltiazem.  --Daily dosing bumetanide for now.   --Hold home metolazone.    Atrial fibrillation:  Known persistent atrial fibrillation.  Now with rapid ventricular rate, possibly driven by acute infection (cellulitis).  Rate improved with IV diltiazem. --Restart home diltiazem (reasonable to do lower dose)  and metoprolol.  Hold IV diltiazem.    Risk Assessment/Risk Scores:        New York Heart Association (NYHA) Functional Class NYHA Class II  CHA2DS2-VASc Score = 3   This indicates a 3.2% annual risk of stroke. [name] score is based upon: CHF History: 0 HTN History: 1 Diabetes History: 0 Stroke History: 0 Vascular Disease History: 0 Age Score: 1 Gender Score: 1         For questions or updates, please contact [facility] Please consult [email or web address] for contact info under    Signed, Judie Grieve, MD  03/06/2023 12:49 AM

## 2023-03-06 NOTE — Progress Notes (Signed)
   03/06/23 0741  Vitals  Temp (!) 97.4 F (36.3 C)  Temp Source Oral  BP (!) 96/51  MAP (mmHg) (!) 61  BP Location Left Arm  BP Method Automatic  Patient Position (if appropriate) Sitting  Pulse Rate (!) 107  Pulse Rate Source Monitor  ECG Heart Rate (!) 113  Resp 20  MEWS COLOR  MEWS Score Color Yellow  Oxygen Therapy  SpO2 96 %  MEWS Score  MEWS Temp 0  MEWS Systolic 1  MEWS Pulse 2  MEWS RR 0  MEWS LOC 0  MEWS Score 3   Patient is a chronic yellow MEWS due to elevated HR. Patient is in A FIB with RVR and is on a cardizem gtt.

## 2023-03-06 NOTE — Progress Notes (Signed)
Patient c/o burning with urination.  Pericare provided and removed purewick and applied barrier cream.  Patient will call for [facility].

## 2023-03-06 NOTE — TOC Initial Note (Signed)
Transition of Care [facility] - Initial/Assessment Note    Patient Details  Name: Hannah Khan MRN: [id number] Date of Birth: 05-27-1956  Transition of Care [facility]) CM/SW Contact:    Leone Haven, RN Phone Number: 03/06/2023, 2:55 PM  Clinical Narrative:                 From home with spouse, she has PCP , [facility], she has insurance on file.  She has rollator, transfer chair , transfer bench.,she has no HH services in place at this time, but she has had [facility] in the past and she states she really liked them.  She states her spouse will transport her home, he is her support system as well.  [facility], and she gets her ozempic from [facility] on [address].    Expected Discharge Plan: Home/Self Care Barriers to Discharge: Continued Medical Work up   Patient Goals and CMS Choice Patient states their goals for this hospitalization and ongoing recovery are:: return home   Choice offered to / list presented to : NA      Expected Discharge Plan and Services In-house Referral: NA Discharge Planning Services: CM Consult Post Acute Care Choice: NA Living arrangements for the past 2 months: Single Family Home                 DME Arranged: N/A DME Agency: NA       HH Arranged: NA          Prior Living Arrangements/Services Living arrangements for the past 2 months: Single Family Home Lives with:: Spouse Patient language and need for interpreter reviewed:: Yes Do you feel safe going back to the place where you live?: Yes      Need for Family Participation in Patient Care: Yes (Comment) Care giver support system in place?: Yes (comment) Current home services: DME (rollator, commode, transfer bench, transfer chair) Criminal Activity/Legal Involvement Pertinent to Current Situation/Hospitalization: No - Comment as needed  Activities of Daily Living Home Assistive Devices/Equipment: Shower chair with back, Environmental consultant  (specify type), Wheelchair ADL Screening (condition at time of admission) Patient's cognitive ability adequate to safely complete daily activities?: Yes Is the patient deaf or have difficulty hearing?: No Does the patient have difficulty seeing, even when wearing glasses/contacts?: No Does the patient have difficulty concentrating, remembering, or making decisions?: No Patient able to express need for assistance with ADLs?: Yes Does the patient have difficulty dressing or bathing?: No Independently performs ADLs?: Yes (appropriate for developmental age) Does the patient have difficulty walking or climbing stairs?: Yes Weakness of Legs: Both Weakness of Arms/Hands: None  Permission Sought/Granted                  Emotional Assessment Appearance:: Appears stated age Attitude/Demeanor/Rapport: Engaged Affect (typically observed): Appropriate Orientation: : Oriented to Self, Oriented to Place, Oriented to  Time, Oriented to Situation Alcohol / Substance Use: Not Applicable Psych Involvement: No (comment)  Admission diagnosis:  Cellulitis of abdominal wall [L03.311] Acute diastolic congestive heart failure (HCC) [I50.31] Atrial fibrillation with RVR (HCC) [I48.91] Severe sepsis (HCC) [A41.9, R65.20] Sepsis due to cellulitis (HCC) [L03.90, A41.9] Patient Active Problem List   Diagnosis Date Noted   Acute diastolic congestive heart failure (HCC) 03/06/2023   Idiopathic hypotension 03/06/2023   Sepsis due to cellulitis (HCC) 03/05/2023   Severe sepsis (HCC) 03/05/2023   Portal hypertension (HCC) 12/23/2022   Acute encephalopathy 01/10/2021   Secondary hypercoagulable state (  HCC) 06/23/2020   Streptococcal bacteremia 03/16/2020   Chronic venous stasis dermatitis of both lower extremities 03/16/2020   Atrial fibrillation with RVR (HCC) 04/26/2018   Acute lower UTI 04/26/2018   Liver cirrhosis secondary to NASH (nonalcoholic steatohepatitis) (HCC) 04/26/2018   Hyperlipidemia  04/26/2018   Type 2 diabetes mellitus with other specified complication (HCC)    Cellulitis of right lower extremity 03/15/2018   Sepsis (HCC) 03/14/2018   Community acquired pneumonia of right lower lobe of lung 10/31/2016   Muscular abdominal pain in right upper quadrant 07/11/2016   Chronic diastolic heart failure (HCC) 07/10/2016   Thrombocytopenia (HCC) 07/10/2016   Permanent atrial fibrillation (HCC) 01/30/2016   Type 2 diabetes mellitus without complication, without long-term current use of insulin (HCC) 01/30/2016   Morbid obesity- 12/10/2015   Cellulitis, abdominal wall    Melena 01/31/2015   Aortic heart murmur 01/31/2015   Pancytopenia (HCC) 07/23/2013   Obstructive sleep apnea 08/07/2007   Essential hypertension 08/07/2007   ALLERGIC RHINITIS 08/07/2007   PCP:  Darrow Bussing, MD Pharmacy:   [id number] [place], [place] - [address] [place] [address] Phone: [phone] Fax: [phone]  [facility] [address] [place] [address] Phone: [phone] Fax: [phone]  [id number] - [place], [place] - [address] AT [facility] [address] [place] [address] Phone: [phone] Fax: [phone]     Social Determinants of Health (SDOH) Social History: SDOH Screenings   Food Insecurity: No Food Insecurity (03/05/2023)  Housing: Low Risk  (03/05/2023)  Transportation Needs: No Transportation Needs (03/05/2023)  Utilities: Not At Risk (03/05/2023)  Tobacco Use: Medium Risk (03/05/2023)   SDOH Interventions:     Readmission Risk Interventions     No data to display

## 2023-03-06 NOTE — Plan of Care (Signed)
  Problem: Fluid Volume: Goal: Ability to maintain a balanced intake and output will improve Outcome: Progressing   Problem: Nutritional: Goal: Maintenance of adequate nutrition will improve Outcome: Progressing   Problem: Skin Integrity: Goal: Risk for impaired skin integrity will decrease Outcome: Progressing   

## 2023-03-06 NOTE — Progress Notes (Signed)
Rounding Note    Patient Name: Hannah Khan Date of Encounter: 03/06/2023  [facility] Cardiologist: Chilton Si, MD   Subjective   Mild dyspnea; right side CP x 15 sec.  Inpatient Medications    Scheduled Meds:  insulin aspart  0-20 Units Subcutaneous TID WC   insulin aspart  0-5 Units Subcutaneous QHS   lactulose  10 g Oral TID   lidocaine  1 patch Transdermal Q24H   midodrine  10 mg Oral TID WC   rifaximin  550 mg Oral BID   Continuous Infusions:  albumin human 25 g (03/06/23 1145)    ceFAZolin (ANCEF) IV     diltiazem (CARDIZEM) infusion 10 mg/hr (03/06/23 0457)   PRN Meds: acetaminophen, albuterol, morphine injection, ondansetron **OR** ondansetron (ZOFRAN) IV   Vital Signs    Vitals:   03/06/23 0002 03/06/23 0100 03/06/23 0741 03/06/23 0855  BP: (!) 86/39  (!) 96/51 (!) 93/49  Pulse: 81 (!) 110 (!) 107 (!) 109  Resp: 15 20 20 19   Temp: 98.6 F (37 C)  (!) 97.4 F (36.3 C)   TempSrc: Oral  Oral   SpO2: 94% 98% 96% 97%  Weight: (!) 156.3 kg     Height: 5\' 7"  (1.702 m)       Intake/Output Summary (Last 24 hours) at 03/06/2023 1149 Last data filed at 03/06/2023 0900 Gross per 24 hour  Intake 978.36 ml  Output 850 ml  Net 128.36 ml      03/06/2023   12:02 AM 03/05/2023    3:37 PM 02/14/2023    1:43 PM  Last 3 Weights  Weight (lbs) 344 lb 9.6 oz 347 lb 3.2 oz 324 lb  Weight (kg) 156.31 kg 157.489 kg 146.965 kg      Telemetry    Atrial fibrillation with upper normal to mildly elevated rate - Personally Reviewed  Physical Exam   GEN: No acute distress.   Neck: supple Cardiac: irregular Respiratory: Clear to auscultation bilaterally. GI: Soft, nontender, non-distended; cellulitis noted MS: 1+ edema Neuro:  Nonfocal  Psych: Normal affect   Labs    Chemistry Recent Labs  Lab 03/05/23 1549 03/06/23 0104  NA 136 134*  K 4.2 3.3*  CL 103 100  CO2 23 22  GLUCOSE 149* 157*  BUN 18 22  CREATININE 1.38* 1.59*  CALCIUM  8.4* 8.1*  MG 1.8  --   PROT 5.9* 5.4*  ALBUMIN 2.7* 2.3*  AST 42* 35  ALT 21 22  ALKPHOS 71 67  BILITOT 5.0* 4.8*  GFRNONAA 42* 35*  ANIONGAP 10 12    Hematology Recent Labs  Lab 03/05/23 1549 03/06/23 0104  WBC 7.4 7.3  RBC 3.59* 3.32*  HGB 11.7* 11.2*  HCT 36.4 33.5*  MCV 101.4* 100.9*  MCH 32.6 33.7  MCHC 32.1 33.4  RDW 15.5 15.7*  PLT 30* 20*    BNP Recent Labs  Lab 03/05/23 1549  BNP 362.6*     Radiology    [place] ASCITES (ABDOMEN LIMITED)  Result Date: 03/06/2023 CLINICAL DATA:  Cirrhosis. EXAM: LIMITED ABDOMEN ULTRASOUND FOR ASCITES TECHNIQUE: Limited ultrasound survey for ascites was performed in all four abdominal quadrants. COMPARISON:  CT angiogram 09/30/2022.  Ultrasound 03/25/2022 FINDINGS: No significant ascites seen in the 4 quadrants of the abdomen by ultrasound. Study somewhat limited by overlapping bowel gas and soft tissue IMPRESSION: No significant pockets of ascites seen. Electronically Signed   By: Karen Kays M.D.   On: 03/06/2023 11:40   DG  Chest Portable 1 View  Result Date: 03/05/2023 CLINICAL DATA:  Shortness of breath EXAM: PORTABLE CHEST 1 VIEW COMPARISON:  11/12/2022 FINDINGS: Stable cardiomegaly. Aortic atherosclerosis. No focal airspace consolidation, pleural effusion, or pneumothorax. IMPRESSION: Cardiomegaly without acute cardiopulmonary disease. Electronically Signed   By: Duanne Guess D.O.   On: 03/05/2023 17:00      Patient Profile     67 y.o. female with past medical history of chronic diastolic congestive heart failure, permanent atrial fibrillation, sleep apnea, morbid obesity, cirrhosis complicated by portal hypertension, hypertension, diabetes mellitus being evaluated for atrial fibrillation and acute on chronic diastolic congestive heart failure.  Echocardiogram February 2024 showed normal LV function, mild left atrial enlargement.  Assessment & Plan    1 permanent atrial fibrillation-patient's heart rate was elevated at  time of admission.  It has improved with Cardizem but blood pressure is borderline.  Patient was initiated on midodrine earlier today.  I would like to avoid amiodarone in the setting of cirrhosis.  Will add digoxin (there is a question of whether she tolerated in the past but I think it is worth a retrial).  She is not anticoagulated due to chronic thrombocytopenia.  2 acute on chronic diastolic congestive heart failure-volume status is difficult to assess due to obesity.  Will discontinue Bumex and try Demadex to milligrams twice daily beginning tomorrow.  Follow renal function.  Given pannus and active cellulitis I would be hesitant to continue Jardiance long-term.  Will discontinue.  3 cellulitis-antibiotics per primary care  4 cirrhosis/thrombocytopenia/diabetes mellitus-Per primary care.  For questions or updates, please contact [facility] Please consult [email or web address] for contact info under        Signed, Olga Millers, MD  03/06/2023, 11:49 AM

## 2023-03-06 NOTE — Progress Notes (Signed)
Patient arrived to room 3E14 from ED.  Assessment complete, VS obtained, and Admission database began.

## 2023-03-06 NOTE — Progress Notes (Signed)
Pt placed on cpap auto mode with a nasal mask, tol well at this time

## 2023-03-06 NOTE — Progress Notes (Addendum)
PROGRESS NOTE    TRELLA MAHANNAH  ZOX:[id number] DOB: 1955-10-22 DOA: 03/05/2023 PCP: Darrow Bussing, MD  Chief Complaint  Patient presents with   Shortness of Breath    Brief Narrative:   Hannah Khan is Hannah Khan 67 y.o. female with medical history significant of multiple medical problems including morbid obesity, type 2 diabetes, history of recurrent GI bleed, Obstructive sleep apnea, essential hypertension, atrial fibrillation, liver cirrhosis secondary to NASH, pancytopenia secondary to liver disease, chronic diastolic heart failure, portal hypertension as [name] result of cirrhosis, who presents with husband complaining of shortness of breath.   Assessment & Plan:   Principal Problem:   Sepsis due to cellulitis [facility]) Active Problems:   Morbid obesity-   Obstructive sleep apnea   Essential hypertension   Cellulitis, abdominal wall   Type 2 diabetes mellitus without complication, without long-term current use of insulin (HCC)   Atrial fibrillation with RVR (HCC)   Liver cirrhosis secondary to NASH (nonalcoholic steatohepatitis) (HCC)   Severe sepsis (HCC)   Acute diastolic congestive heart failure (HCC)   Idiopathic hypotension  Hypotension This sounds chronic - BP in outpatient office 01/2023 was 80's over 40's.  This is related to her cirrhosis. She's on metoprolol and diltiazem outpatient in addition to spironolactone and bumex Given her hypotension, will hold diuretics and AV nodal blockers at this time (she's currently on dilt gtt, will continue for now, but hold if BP's trending down).  Start midodrine 10 mg TID given her cirrhosis and chronic hypotension.  Will give albumin.  Sepsis due to Cellulitis  Ruled in with fever, tachycardia Blood cultures pending Continue ancef  In setting of cirrhosis, ascites search (consider para to r/o SBP if ascites present)  Shortness of Breath C/o SOB at presentation, improved now - maybe related to RVR? CXR without acute cardiopulmonary  dz Will monitor -> resume diuretics when able   Atrial Fibrillation with RVR Currently on dilt gtt.  Hold oral metop with hypotension. Appreciate continued cardiology recs  NASH Cirrhosis  Severe Thrombocytopenia  Continue xifaxan, lactulose Apparently plan for TIPs which was aborted due to elevated right heart pressures 12/2022 Has [name] history of grade 1 esophageal varices (04/2022) -> beta blocker currently on hold with hypotension, would prefer nonselective beta blocker with cirrhosis with varices -> when resuming beta blocker will discuss with cards whether coreg might be reasonable alternative Needs to establish with GI outpatient  Chronic thrombocytopenia related to cirrhosis   HFpEF  Volume Overload Appreciate cards recs, holding diuretics for now Appreciate cardiology recommendations Agree with Dr. [name] regarding stopping jardiance given her recurrent cellulitis of her pannus   AKI In the setting of acute infection, sepsis Follow with albumin  T2DM Currently on SSI On jardiance outpatient - this should be discontinued Uses GLP 1 agonist outpatient    OSA CPAP  Chronic Pain     DVT prophylaxis: severe thrombocytopenia, none Code Status: full Family Communication: none Disposition:   Status is: Inpatient Remains inpatient appropriate because: no new complaints   Consultants:  cardiology  Procedures:  none  Antimicrobials:  Anti-infectives (From admission, onward)    Start     Dose/Rate Route Frequency Ordered Stop   03/06/23 1400  ceFAZolin (ANCEF) IVPB 2g/100 mL premix        2 g 200 mL/hr over 30 Minutes Intravenous Every 8 hours 03/06/23 1052 03/13/23 1359   03/06/23 1200  rifaximin (XIFAXAN) tablet 550 mg        550 mg Oral 2  times daily 03/06/23 1054     03/05/23 2100  cefTRIAXone (ROCEPHIN) 2 g in sodium chloride 0.9 % 100 mL IVPB  Status:  Discontinued        2 g 200 mL/hr over 30 Minutes Intravenous Every 24 hours 03/05/23 2051 03/05/23  2053   03/05/23 1630  cefTRIAXone (ROCEPHIN) 2 g in sodium chloride 0.9 % 100 mL IVPB  Status:  Discontinued        2 g 200 mL/hr over 30 Minutes Intravenous Every 24 hours 03/05/23 1629 03/06/23 1052       Subjective: Feeling poorly since meds changed [name] week ago Worse within past day when she noted cellulitis   Objective: Vitals:   03/06/23 0002 03/06/23 0100 03/06/23 0741 03/06/23 0855  BP: (!) 86/39  (!) 96/51 (!) 93/49  Pulse: 81 (!) 110 (!) 107 (!) 109  Resp: 15 20 20 19   Temp: 98.6 F (37 C)  (!) 97.4 F (36.3 C)   TempSrc: Oral  Oral   SpO2: 94% 98% 96% 97%  Weight: (!) 156.3 kg     Height: 5\' 7"  (1.702 m)       Intake/Output Summary (Last 24 hours) at 03/06/2023 1129 Last data filed at 03/06/2023 0900 Gross per 24 hour  Intake 978.36 ml  Output 850 ml  Net 128.36 ml   Filed Weights   03/05/23 1537 03/06/23 0002  Weight: (!) 157.5 kg (!) 156.3 kg    Examination:  General exam: Appears calm and comfortable  Respiratory system: diminished Cardiovascular system: tachy, irregularly irregular Gastrointestinal system: protuberant abdomen, erythema and induration over large portion of lower pannus, not TTP  Central nervous system: Alert and oriented. No focal neurological deficits. Extremities: bilateral LE edema, minimal pitting   Data Reviewed: I have personally reviewed following labs and imaging studies  CBC: Recent Labs  Lab 03/05/23 1549 03/06/23 0104  WBC 7.4 7.3  NEUTROABS 6.7  --   HGB 11.7* 11.2*  HCT 36.4 33.5*  MCV 101.4* 100.9*  PLT 30* 20*    Basic Metabolic Panel: Recent Labs  Lab 03/05/23 1549 03/06/23 0104  NA 136 134*  K 4.2 3.3*  CL 103 100  CO2 23 22  GLUCOSE 149* 157*  BUN 18 22  CREATININE 1.38* 1.59*  CALCIUM 8.4* 8.1*  MG 1.8  --     GFR: Estimated Creatinine Clearance: 53.9 mL/min (Lucero Ide) (by C-G formula based on SCr of 1.59 mg/dL (H)).  Liver Function Tests: Recent Labs  Lab 03/05/23 1549 03/06/23 0104  AST  42* 35  ALT 21 22  ALKPHOS 71 67  BILITOT 5.0* 4.8*  PROT 5.9* 5.4*  ALBUMIN 2.7* 2.3*    CBG: Recent Labs  Lab 03/05/23 2142 03/06/23 0610  GLUCAP 164* 148*     Recent Results (from the past 240 hour(s))  Blood Culture (routine x 2)     Status: None (Preliminary result)   Collection Time: 03/05/23  3:49 PM   Specimen: BLOOD  Result Value Ref Range Status   Specimen Description BLOOD BLOOD RIGHT HAND  Final   Special Requests   Final    BOTTLES DRAWN AEROBIC AND ANAEROBIC Blood Culture results may not be optimal due to an inadequate volume of blood received in culture bottles   Culture   Final    NO GROWTH < 24 HOURS Performed at [facility], [address] [place], [place] [address]    Report Status PENDING  Incomplete  Blood Culture (routine x 2)  Status: None (Preliminary result)   Collection Time: 03/05/23  4:34 PM   Specimen: BLOOD  Result Value Ref Range Status   Specimen Description BLOOD SITE NOT SPECIFIED  Final   Special Requests   Final    BOTTLES DRAWN AEROBIC ONLY Blood Culture adequate volume   Culture   Final    NO GROWTH < 24 HOURS Performed at [facility], [address] [place], [place] [address]    Report Status PENDING  Incomplete         Radiology Studies: DG Chest Portable 1 View  Result Date: 03/05/2023 CLINICAL DATA:  Shortness of breath EXAM: PORTABLE CHEST 1 VIEW COMPARISON:  11/12/2022 FINDINGS: Stable cardiomegaly. Aortic atherosclerosis. No focal airspace consolidation, pleural effusion, or pneumothorax. IMPRESSION: Cardiomegaly without acute cardiopulmonary disease. Electronically Signed   By: Duanne Guess D.O.   On: 03/05/2023 17:00        Scheduled Meds:  insulin aspart  0-20 Units Subcutaneous TID WC   insulin aspart  0-5 Units Subcutaneous QHS   lactulose  10 g Oral TID   lidocaine  1 patch Transdermal Q24H   metoprolol succinate  100 mg Oral Daily   midodrine  10 mg Oral TID WC   rifaximin  550  mg Oral BID   Continuous Infusions:  albumin human      ceFAZolin (ANCEF) IV     diltiazem (CARDIZEM) infusion 10 mg/hr (03/06/23 0457)     LOS: 1 day    Time spent: over 30 min    Lacretia Nicks, MD [facility]   To contact the attending provider between 7A-7P or the covering provider during after hours 7P-7A, please log into the web site [email or web address] and access using universal  [place] password for that web site. If you do not have the password, please call the hospital operator.  03/06/2023, 11:29 AM

## 2023-03-06 NOTE — Consult Note (Signed)
Consultation Note Date: 03/06/2023   Patient Name: Hannah Khan  DOB: 1956/05/05  MRN: [id number]  Age / Sex: 67 y.o., female  PCP: Darrow Bussing, MD Referring Physician: Zigmund Daniel., *  Reason for Consultation: Establishing goals of care  HPI/Patient Profile: 67 y.o. female  with past medical history of multiple medical problems including morbid obesity, type 2 diabetes, history of recurrent GI bleed, Obstructive sleep apnea, essential hypertension, atrial fibrillation, liver cirrhosis secondary to NASH, pancytopenia secondary to liver disease, chronic diastolic heart failure, portal hypertension as a result of cirrhosis admitted on 03/05/2023 with shortness of breath and cellulitis on her left abdominal wall.   Patient has been admitted 3 times in the past 6 months.  Her last discussion with PMT was in June 2022.  She is currently admitted for A-fib with RVR and severe sepsis due to abdominal wall cellulitis.  She also has hypotension in the setting of sepsis and liver cirrhosis.  PMT has been consulted to assist with goals of care conversation.  Clinical Assessment and Goals of Care:  I have reviewed medical records including EPIC notes, labs and imaging, discussed with RN, assessed the patient and then met the bedside to discuss diagnosis prognosis, GOC, EOL wishes, disposition and options.  I introduced Palliative Medicine as specialized medical care for people living with serious illness. It focuses on providing relief from the symptoms and stress of a serious illness. The goal is to improve quality of life for both the patient and the family.  We discussed a brief life review of the patient and then focused on their current illness.   I attempted to elicit values and goals of care important to the patient.    Medical History Review and Understanding:  Patient has a good understanding severity  of her illness.  She is able to recount her chronic comorbidities and her current care plan.  She also accurately describes limitations to her treatment options such as cardioversion given her underlying comorbidities.  She is considering whether she even wants to reattempt a TIPS procedure.  Social History: Patient has 2 daughters and a son.  She is married to her husband Aurther Loft.  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  She enjoys traveling and spending time with her family.  She lives at home with her husband.  She is a Curator.  Functional and Nutritional State: Patient describes her recovery after her serious illness a couple years ago.  She relearned how to feed herself and walk again.  She now uses a rollator with frequent breaks to sit, though she still needs an Mining engineer wheelchair at the grocery store.  Her appetite is acceptable but not great.  She reports some cognitive decline over the past couple years, with slower processing, difficulty with numbers, and occasionally difficulty with word finding.  Palliative Symptoms: Anxiety  Advance Directives: A detailed discussion regarding advanced directives was had.  Patient has advanced directives on file from 2022, but she recently redid them in spring 2024.  Her husband Aurther Loft remains primary HCPOA, law her son Trey Paula is now secondary HCPOA rather than her daughter Shanda Bumps.  She believes her sister-in-law is also listed as an alternate HCPOA.  This is to avoid further conflict between her daughter and her husband.  Code Status: Concepts specific to code status, artifical feeding and hydration, and rehospitalization were considered and discussed.  Patient confirms she currently desires to be a full code.  She states she was a  DNR previously because "I thought I was about to die."  Discussion: Patient recollects being told that she only had 3 days to live during hospitalization in May 2022.  She feels she is doing well considering  those expectations, though she acknowledges that she still has her limitations.  She is anxious about the ongoing changes to her medications and how they will affect her.  Overall, she is feeling better than she did yesterday.   Since her last discussions with PMT, she has had several conversations about her wishes with her family.  She has filed new advance directives with [facility] this year and agrees to bring them in for her EMR to be updated.  She was able to travel to the [facility] on a long road trip and enjoy a cruise, as well as a visit back home to her family in [place].  She is also made over 5 treatments for end-of-life cremation and transport for rashes to her hometown.  Her goal for the future is to continue traveling.  She was unaware that she had completed a MOST form and states "I really was not able to make those decisions back then."  She would like to review the MOST form with her husband this evening and this proceed with updating tomorrow.   Discussed the importance of continued conversation with family and the medical providers regarding overall plan of care and treatment options, ensuring decisions are within the context of the patient's values and GOCs.   Questions and concerns were addressed.  Hard Choices booklet left for review. The family was encouraged to call with questions or concerns.  PMT will continue to support holistically.   SUMMARY OF RECOMMENDATIONS   -Continue full code/full scope treatment -Patient will have family bring in updated advanced directives for scanning into EMR -Ongoing goals of care discussions and MOST form completion -Psychosocial and emotional support provided -PMT will continue to follow and support   Prognosis:  Unable to determine  Discharge Planning: To Be Determined      Primary Diagnoses: Present on Admission:  Morbid obesity-  Obstructive sleep apnea  Essential hypertension  Liver cirrhosis secondary to NASH  (nonalcoholic steatohepatitis) (HCC)  Atrial fibrillation with RVR (HCC)  Cellulitis, abdominal wall  Sepsis due to cellulitis (HCC)  Severe sepsis [facility])   Physical Exam Vitals and nursing note reviewed.  Constitutional:      General: She is not in acute distress. Cardiovascular:     Rate and Rhythm: Normal rate.  Pulmonary:     Effort: Pulmonary effort is normal. No respiratory distress.  Skin:    General: Skin is dry.  Neurological:     Mental Status: She is alert and oriented to person, place, and time.    Vital Signs: BP (!) 98/53 (BP Location: Left Arm)   Pulse (!) 103   Temp (!) 97.4 F (36.3 C) (Oral)   Resp 11   Ht 5\' 7"  (1.702 m)   Wt (!) 156.3 kg   SpO2 97%   BMI 53.97 kg/m  Pain Scale: 0-10   Pain Score: 5    SpO2: SpO2: 97 % O2 Device:SpO2: 97 % O2 Flow Rate: .    Palliative Assessment/Data: 50%     MDM: High    Kia Stavros Jeni Salles, PA-C  Palliative Medicine Team Team phone # [phone]  Thank you for allowing the Palliative Medicine Team to assist in the care of this patient. Please utilize secure chat with additional questions, if there is  no response within 30 minutes please call the above phone number.  Palliative Medicine Team providers are available by phone from 7am to 7pm daily and can be reached through the team cell phone.  Should this patient require assistance outside of these hours, please call the patient's attending physician.

## 2023-03-07 ENCOUNTER — Other Ambulatory Visit (HOSPITAL_COMMUNITY): Payer: Medicare PPO

## 2023-03-07 ENCOUNTER — Inpatient Hospital Stay (HOSPITAL_COMMUNITY): Payer: Medicare PPO

## 2023-03-07 ENCOUNTER — Other Ambulatory Visit (HOSPITAL_BASED_OUTPATIENT_CLINIC_OR_DEPARTMENT_OTHER): Payer: Medicare PPO

## 2023-03-07 DIAGNOSIS — I5031 Acute diastolic (congestive) heart failure: Secondary | ICD-10-CM

## 2023-03-07 DIAGNOSIS — I4819 Other persistent atrial fibrillation: Secondary | ICD-10-CM | POA: Diagnosis not present

## 2023-03-07 DIAGNOSIS — R6520 Severe sepsis without septic shock: Secondary | ICD-10-CM

## 2023-03-07 DIAGNOSIS — A419 Sepsis, unspecified organism: Secondary | ICD-10-CM | POA: Diagnosis not present

## 2023-03-07 DIAGNOSIS — L03311 Cellulitis of abdominal wall: Secondary | ICD-10-CM | POA: Diagnosis not present

## 2023-03-07 DIAGNOSIS — L0390 Cellulitis, unspecified: Secondary | ICD-10-CM | POA: Diagnosis not present

## 2023-03-07 LAB — BLOOD CULTURE ID PANEL (REFLEXED) - BCID2

## 2023-03-07 LAB — CBC WITH DIFFERENTIAL/PLATELET
Abs Immature Granulocytes: 0 10*3/uL (ref 0.00–0.07)
Basophils Absolute: 0 10*3/uL (ref 0.0–0.1)
Basophils Relative: 1 %
Eosinophils Absolute: 0.1 10*3/uL (ref 0.0–0.5)
Eosinophils Relative: 3 %
HCT: 28.6 % — ABNORMAL LOW (ref 36.0–46.0)
Hemoglobin: 9.8 g/dL — ABNORMAL LOW (ref 12.0–15.0)
Immature Granulocytes: 0 %
Lymphocytes Relative: 13 %
Lymphs Abs: 0.3 10*3/uL — ABNORMAL LOW (ref 0.7–4.0)
MCH: 33 pg (ref 26.0–34.0)
MCHC: 34.3 g/dL (ref 30.0–36.0)
MCV: 96.3 fL (ref 80.0–100.0)
Monocytes Absolute: 0.3 10*3/uL (ref 0.1–1.0)
Monocytes Relative: 13 %
Neutro Abs: 1.7 10*3/uL (ref 1.7–7.7)
Neutrophils Relative %: 70 %
Platelets: 19 10*3/uL — CL (ref 150–400)
RBC: 2.97 MIL/uL — ABNORMAL LOW (ref 3.87–5.11)
RDW: 15.4 % (ref 11.5–15.5)
WBC: 2.4 10*3/uL — ABNORMAL LOW (ref 4.0–10.5)
nRBC: 0 % (ref 0.0–0.2)

## 2023-03-07 LAB — COMPREHENSIVE METABOLIC PANEL
ALT: 22 U/L (ref 0–44)
AST: 37 U/L (ref 15–41)
Albumin: 2.9 g/dL — ABNORMAL LOW (ref 3.5–5.0)
Alkaline Phosphatase: 52 U/L (ref 38–126)
Anion gap: 13 (ref 5–15)
BUN: 28 mg/dL — ABNORMAL HIGH (ref 8–23)
CO2: 24 mmol/L (ref 22–32)
Calcium: 8.4 mg/dL — ABNORMAL LOW (ref 8.9–10.3)
Chloride: 97 mmol/L — ABNORMAL LOW (ref 98–111)
Creatinine, Ser: 1.68 mg/dL — ABNORMAL HIGH (ref 0.44–1.00)
GFR, Estimated: 33 mL/min — ABNORMAL LOW (ref >60)
Glucose, Bld: 161 mg/dL — ABNORMAL HIGH (ref 70–99)
Potassium: 2.9 mmol/L — ABNORMAL LOW (ref 3.5–5.1)
Sodium: 134 mmol/L — ABNORMAL LOW (ref 135–145)
Total Bilirubin: 4.2 mg/dL — ABNORMAL HIGH (ref 0.3–1.2)
Total Protein: 5.7 g/dL — ABNORMAL LOW (ref 6.5–8.1)

## 2023-03-07 LAB — ECHOCARDIOGRAM COMPLETE
AV Mean grad: 15 mmHg
AV Peak grad: 21.4 mmHg
Ao pk vel: 2.32 m/s
Area-P 1/2: 3.95 cm2
Height: 67 in
S' Lateral: 3.7 cm
Weight: 5528 oz

## 2023-03-07 LAB — GLUCOSE, CAPILLARY
Glucose-Capillary: 113 mg/dL — ABNORMAL HIGH (ref 70–99)
Glucose-Capillary: 103 mg/dL — ABNORMAL HIGH (ref 70–99)
Glucose-Capillary: 104 mg/dL — ABNORMAL HIGH (ref 70–99)
Glucose-Capillary: 138 mg/dL — ABNORMAL HIGH (ref 70–99)

## 2023-03-07 LAB — PROTIME-INR
INR: 1.4 — ABNORMAL HIGH (ref 0.8–1.2)
Prothrombin Time: 17.3 seconds — ABNORMAL HIGH (ref 11.4–15.2)

## 2023-03-07 LAB — PHOSPHORUS: Phosphorus: 2.9 mg/dL (ref 2.5–4.6)

## 2023-03-07 LAB — CULTURE, BLOOD (ROUTINE X 2)

## 2023-03-07 LAB — MAGNESIUM: Magnesium: 1.8 mg/dL (ref 1.7–2.4)

## 2023-03-07 MED ORDER — ALBUMIN HUMAN 25 % IV SOLN
25.0000 g | Freq: Once | INTRAVENOUS | Status: AC
  Administered 2023-03-07: 25 g via INTRAVENOUS
  Filled 2023-03-07: qty 100

## 2023-03-07 MED ORDER — FUROSEMIDE 10 MG/ML IJ SOLN
40.0000 mg | Freq: Two times a day (BID) | INTRAMUSCULAR | Status: DC
  Administered 2023-03-07 – 2023-03-10 (×8): 40 mg via INTRAVENOUS
  Filled 2023-03-07 (×9): qty 4

## 2023-03-07 MED ORDER — POTASSIUM CHLORIDE CRYS ER 20 MEQ PO TBCR
40.0000 meq | EXTENDED_RELEASE_TABLET | ORAL | Status: AC
  Administered 2023-03-07 (×3): 40 meq via ORAL
  Filled 2023-03-07 (×3): qty 2

## 2023-03-07 MED ORDER — DILTIAZEM HCL 60 MG PO TABS
60.0000 mg | ORAL_TABLET | Freq: Three times a day (TID) | ORAL | Status: DC
  Administered 2023-03-07 – 2023-03-08 (×3): 60 mg via ORAL
  Filled 2023-03-07 (×4): qty 1

## 2023-03-07 MED ORDER — PERFLUTREN LIPID MICROSPHERE
1.0000 mL | INTRAVENOUS | Status: AC | PRN
  Administered 2023-03-07: 2 mL via INTRAVENOUS

## 2023-03-07 NOTE — Progress Notes (Signed)
PROGRESS NOTE    Hannah Khan  ZOX:[id number] DOB: 04-29-1956 DOA: 03/05/2023 PCP: Darrow Bussing, MD  Chief Complaint  Patient presents with   Shortness of Breath    Brief Narrative:   Hannah Khan is Hannah Khan 67 y.o. female with medical history significant of multiple medical problems including morbid obesity, type 2 diabetes, history of recurrent GI bleed, Obstructive sleep apnea, essential hypertension, atrial fibrillation, liver cirrhosis secondary to NASH, pancytopenia secondary to liver disease, chronic diastolic heart failure, portal hypertension as [name] result of cirrhosis, who presents with husband complaining of shortness of breath.   She was admitted with abdominal cellulitis and atrial fibrillation with RVR.  Started on abx.  Cardiology following.  See below for additional details  Assessment & Plan:   Principal Problem:   Sepsis due to cellulitis [facility]) Active Problems:   Morbid obesity-   Obstructive sleep apnea   Essential hypertension   Cellulitis, abdominal wall   Type 2 diabetes mellitus without complication, without long-term current use of insulin (HCC)   Atrial fibrillation with RVR (HCC)   Liver cirrhosis secondary to NASH (nonalcoholic steatohepatitis) (HCC)   Severe sepsis (HCC)   Acute diastolic congestive heart failure (HCC)   Idiopathic hypotension  Hypotension This sounds chronic - BP in outpatient office 01/2023 was 80's over 40's.  This is related to her cirrhosis. She's on metoprolol and diltiazem outpatient in addition to spironolactone and bumex Will defer av nodal blockers to cards with afib at this time, diuretics per cards Continue midodrine 10 mg TID given her cirrhosis and chronic hypotension - would prescribe this at discharge to continue.  Will give albumin.  Sepsis due to Cellulitis  Ruled in with fever, tachycardia Blood cultures -> growing staph epidermidis, suspect this is [name] contaminant  Continue ancef  No ascites  Shortness of  Breath C/o SOB at presentation, improved now - maybe related to RVR? CXR without acute cardiopulmonary dz Diuretics per cardiology  Atrial Fibrillation with RVR Transitioning to oral dilt.  Hold oral metop with hypotension. Appreciate continued cardiology recs  NASH Cirrhosis  Severe Thrombocytopenia  Continue xifaxan, lactulose Apparently plan for TIPs which was aborted due to elevated right heart pressures 12/2022 Has [name] history of grade 1 esophageal varices (04/2022) -> beta blocker currently on hold with hypotension, would prefer nonselective beta blocker with cirrhosis with varices -> when resuming beta blocker will discuss with cards whether coreg might be reasonable alternative Needs to establish with GI outpatient  Chronic thrombocytopenia related to cirrhosis   HFpEF  Volume Overload Appreciate cards recs, diuresis per cardiology Appreciate cardiology recommendations Agree with Dr. [name] regarding stopping jardiance given her recurrent cellulitis of her pannus   AKI In the setting of acute infection, sepsis Follow with albumin, diuresis per cards  T2DM Currently on SSI On jardiance outpatient - this should be discontinued Uses GLP 1 agonist outpatient    OSA CPAP  Chronic Pain     DVT prophylaxis: severe thrombocytopenia, none Code Status: full Family Communication: none Disposition:   Status is: Inpatient Remains inpatient appropriate because: no new complaints   Consultants:  cardiology  Procedures:  none  Antimicrobials:  Anti-infectives (From admission, onward)    Start     Dose/Rate Route Frequency Ordered Stop   03/06/23 1400  ceFAZolin (ANCEF) IVPB 2g/100 mL premix        2 g 200 mL/hr over 30 Minutes Intravenous Every 8 hours 03/06/23 1052 03/13/23 1359   03/06/23 1200  rifaximin (XIFAXAN) tablet  550 mg        550 mg Oral 2 times daily 03/06/23 1054     03/05/23 2100  cefTRIAXone (ROCEPHIN) 2 g in sodium chloride 0.9 % 100 mL IVPB   Status:  Discontinued        2 g 200 mL/hr over 30 Minutes Intravenous Every 24 hours 03/05/23 2051 03/05/23 2053   03/05/23 1630  cefTRIAXone (ROCEPHIN) 2 g in sodium chloride 0.9 % 100 mL IVPB  Status:  Discontinued        2 g 200 mL/hr over 30 Minutes Intravenous Every 24 hours 03/05/23 1629 03/06/23 1052       Subjective: Thinks cellulitis improving  Objective: Vitals:   03/07/23 0836 03/07/23 1129 03/07/23 1521 03/07/23 1707  BP: (!) 92/53 (!) 112/59 (!) 101/52 (!) 99/53  Pulse:  89 86   Resp: 18 20 18    Temp:  98.2 F (36.8 C) 98 F (36.7 C)   TempSrc:  Oral Oral   SpO2: 92% 97% 100%   Weight:      Height:        Intake/Output Summary (Last 24 hours) at 03/07/2023 1810 Last data filed at 03/07/2023 1801 Gross per 24 hour  Intake 834.74 ml  Output 3250 ml  Net -2415.26 ml   Filed Weights   03/05/23 1537 03/06/23 0002 03/07/23 0531  Weight: (!) 157.5 kg (!) 156.3 kg (!) 156.7 kg    Examination:  General: No acute distress. Cardiovascular:  irregularly irergular Lungs:  unlabored Abdomen: protuberant, redness across lower half of abdomen Neurological: Alert and oriented 3. Moves all extremities 4 with equal strength. Cranial nerves II through XII grossly intact. Extremities: No clubbing or cyanosis. No edema.  Data Reviewed: I have personally reviewed following labs and imaging studies  CBC: Recent Labs  Lab 03/05/23 1549 03/06/23 0104 03/07/23 0059  WBC 7.4 7.3 2.4*  NEUTROABS 6.7  --  1.7  HGB 11.7* 11.2* 9.8*  HCT 36.4 33.5* 28.6*  MCV 101.4* 100.9* 96.3  PLT 30* 20* 19*    Basic Metabolic Panel: Recent Labs  Lab 03/05/23 1549 03/06/23 0104 03/07/23 0059  NA 136 134* 134*  K 4.2 3.3* 2.9*  CL 103 100 97*  CO2 23 22 24   GLUCOSE 149* 157* 161*  BUN 18 22 28*  CREATININE 1.38* 1.59* 1.68*  CALCIUM 8.4* 8.1* 8.4*  MG 1.8  --  1.8  PHOS  --   --  2.9    GFR: Estimated Creatinine Clearance: 51.1 mL/min (Tanica Gaige) (by C-G formula based on  SCr of 1.68 mg/dL (H)).  Liver Function Tests: Recent Labs  Lab 03/05/23 1549 03/06/23 0104 03/07/23 0059  AST 42* 35 37  ALT 21 22 22   ALKPHOS 71 67 52  BILITOT 5.0* 4.8* 4.2*  PROT 5.9* 5.4* 5.7*  ALBUMIN 2.7* 2.3* 2.9*    CBG: Recent Labs  Lab 03/06/23 1602 03/06/23 2109 03/07/23 0600 03/07/23 1126 03/07/23 1608  GLUCAP 139* 128* 113* 103* 104*     Recent Results (from the past 240 hour(s))  Blood Culture (routine x 2)     Status: None (Preliminary result)   Collection Time: 03/05/23  3:49 PM   Specimen: BLOOD  Result Value Ref Range Status   Specimen Description BLOOD BLOOD RIGHT HAND  Final   Special Requests   Final    BOTTLES DRAWN AEROBIC AND ANAEROBIC Blood Culture results may not be optimal due to an inadequate volume of blood received in culture bottles   Culture  Final    NO GROWTH 2 DAYS Performed at [facility], [address] [place], [place] [address]    Report Status PENDING  Incomplete  Blood Culture (routine x 2)     Status: None (Preliminary result)   Collection Time: 03/05/23  4:34 PM   Specimen: BLOOD  Result Value Ref Range Status   Specimen Description BLOOD SITE NOT SPECIFIED  Final   Special Requests   Final    BOTTLES DRAWN AEROBIC ONLY Blood Culture adequate volume   Culture  Setup Time   Final    GRAM POSITIVE COCCI IN CLUSTERS AEROBIC BOTTLE ONLY CRITICAL RESULT CALLED TO, READ BACK BY AND VERIFIED WITH: PHARMD J. LEDFORD 03/07/23 @ 0424 BY AB Performed at [facility], [address] [place], [place] [address]    Culture GRAM POSITIVE COCCI  Final   Report Status PENDING  Incomplete  Blood Culture ID Panel (Reflexed)     Status: Abnormal   Collection Time: 03/05/23  4:34 PM  Result Value Ref Range Status   Enterococcus faecalis NOT DETECTED NOT DETECTED Final   Enterococcus Faecium NOT DETECTED NOT DETECTED Final   Listeria monocytogenes NOT DETECTED NOT DETECTED Final   Staphylococcus species DETECTED (Jaime Grizzell)  NOT DETECTED Final    Comment: CRITICAL RESULT CALLED TO, READ BACK BY AND VERIFIED WITH: PHARMD J. LEDFORD 03/07/23 @ 0424 BY AB    Staphylococcus aureus (BCID) NOT DETECTED NOT DETECTED Final   Staphylococcus epidermidis DETECTED (Glendola Friedhoff) NOT DETECTED Final    Comment: Methicillin (oxacillin) resistant coagulase negative staphylococcus. Possible blood culture contaminant (unless isolated from more than one blood culture draw or clinical case suggests pathogenicity). No antibiotic treatment is indicated for blood  culture contaminants. CRITICAL RESULT CALLED TO, READ BACK BY AND VERIFIED WITH: PHARMD J. LEDFORD 03/07/23 @ 0424 BY AB    Staphylococcus lugdunensis NOT DETECTED NOT DETECTED Final   Streptococcus species NOT DETECTED NOT DETECTED Final   Streptococcus agalactiae NOT DETECTED NOT DETECTED Final   Streptococcus pneumoniae NOT DETECTED NOT DETECTED Final   Streptococcus pyogenes NOT DETECTED NOT DETECTED Final   Hebe Merriwether.calcoaceticus-baumannii NOT DETECTED NOT DETECTED Final   Bacteroides fragilis NOT DETECTED NOT DETECTED Final   Enterobacterales NOT DETECTED NOT DETECTED Final   Enterobacter cloacae complex NOT DETECTED NOT DETECTED Final   Escherichia coli NOT DETECTED NOT DETECTED Final   Klebsiella aerogenes NOT DETECTED NOT DETECTED Final   Klebsiella oxytoca NOT DETECTED NOT DETECTED Final   Klebsiella pneumoniae NOT DETECTED NOT DETECTED Final   Proteus species NOT DETECTED NOT DETECTED Final   Salmonella species NOT DETECTED NOT DETECTED Final   Serratia marcescens NOT DETECTED NOT DETECTED Final   Haemophilus influenzae NOT DETECTED NOT DETECTED Final   Neisseria meningitidis NOT DETECTED NOT DETECTED Final   Pseudomonas aeruginosa NOT DETECTED NOT DETECTED Final   Stenotrophomonas maltophilia NOT DETECTED NOT DETECTED Final   Candida albicans NOT DETECTED NOT DETECTED Final   Candida auris NOT DETECTED NOT DETECTED Final   Candida glabrata NOT DETECTED NOT DETECTED Final    Candida krusei NOT DETECTED NOT DETECTED Final   Candida parapsilosis NOT DETECTED NOT DETECTED Final   Candida tropicalis NOT DETECTED NOT DETECTED Final   Cryptococcus neoformans/gattii NOT DETECTED NOT DETECTED Final   Methicillin resistance mecA/C DETECTED (Evadne Ose) NOT DETECTED Final    Comment: CRITICAL RESULT CALLED TO, READ BACK BY AND VERIFIED WITH: PHARMD J. LEDFORD 03/07/23 @ 0424 BY AB Performed at [facility], [address] [place], [place] [address]          Radiology Studies: ECHOCARDIOGRAM COMPLETE  Result Date: 03/07/2023    ECHOCARDIOGRAM REPORT   Patient Name:   ARYIEL COOR Date of Exam: 03/07/2023 Medical Rec #:  [id number]       Height:       67.0 in Accession #:    [id number]      Weight:       345.5 lb Date of Birth:  Aug 30, 1956       BSA:          2.551 m Patient Age:    67 years        BP:           112/59 mmHg Patient Gender: F               HR:           83 bpm. Exam Location:  Inpatient Procedure: 2D Echo, Cardiac Doppler, Color Doppler and Intracardiac            Opacification Agent Indications:    CHF- Diastolic  History:        Patient has prior history of Echocardiogram examinations, most                 recent 11/01/2022. CHF, Arrythmias:Atrial Fibrillation,                 Signs/Symptoms:Shortness of Breath and Hypotension; Risk                 Factors:Sleep Apnea, Diabetes, Hypertension and Morbid obesity.                 Liver cirrhosis secondary to NASH, portal HTN.  Sonographer:    Milda Smart Referring Phys: [phone] Rosland Riding CALDWELL POWELL JR  Sonographer Comments: Patient is obese and Technically difficult study due to poor echo windows. Image acquisition challenging due to patient body habitus and Image acquisition challenging due to respiratory motion. IMPRESSIONS  1. Left ventricular ejection fraction, by estimation, is 55 to 60%. The left ventricle has normal function. The left ventricle has no regional wall motion abnormalities. Left ventricular  diastolic function could not be evaluated.  2. Right ventricular systolic function is normal. The right ventricular size is normal. There is mildly elevated pulmonary artery systolic pressure. The estimated right ventricular systolic pressure is 39.6 mmHg.  3. Left atrial size was severely dilated.  4. Right atrial size was severely dilated.  5. The mitral valve is normal in structure. No evidence of mitral valve regurgitation.  6. Tricuspid valve regurgitation is moderate.  7. The aortic valve is tricuspid. There is moderate calcification of the aortic valve. There is moderate thickening of the aortic valve. Aortic valve regurgitation is not visualized. Mild to moderate aortic valve stenosis.  8. The inferior vena cava is dilated in size with <50% respiratory variability, suggesting right atrial pressure of 15 mmHg. Comparison(s): No significant change from prior study. Prior images reviewed side by side. FINDINGS  Left Ventricle: Left ventricular ejection fraction, by estimation, is 55 to 60%. The left ventricle has normal function. The left ventricle has no regional wall motion abnormalities. Definity contrast agent was given IV to delineate the left ventricular  endocardial borders. The left ventricular internal cavity size was normal in size. There is no left ventricular hypertrophy. Left ventricular diastolic function could not be evaluated due to atrial fibrillation. Left ventricular diastolic function could  not be evaluated. Right  Ventricle: The right ventricular size is normal. No increase in right ventricular wall thickness. Right ventricular systolic function is normal. There is mildly elevated pulmonary artery systolic pressure. The tricuspid regurgitant velocity is 2.48  m/s, and with an assumed right atrial pressure of 15 mmHg, the estimated right ventricular systolic pressure is 39.6 mmHg. Left Atrium: Left atrial size was severely dilated. Right Atrium: Right atrial size was severely dilated.  Pericardium: There is no evidence of pericardial effusion. Mitral Valve: The mitral valve is normal in structure. No evidence of mitral valve regurgitation. Tricuspid Valve: The tricuspid valve is grossly normal. Tricuspid valve regurgitation is moderate. Aortic Valve: The aortic valve is tricuspid. There is moderate calcification of the aortic valve. There is moderate thickening of the aortic valve. Aortic valve regurgitation is not visualized. Mild to moderate aortic stenosis is present. Aortic valve mean gradient measures 15.0 mmHg. Aortic valve peak gradient measures 21.4 mmHg. Pulmonic Valve: The pulmonic valve was not well visualized. Pulmonic valve regurgitation is not visualized. No evidence of pulmonic stenosis. Aorta: The aortic root and ascending aorta are structurally normal, with no evidence of dilitation. Venous: The inferior vena cava is dilated in size with less than 50% respiratory variability, suggesting right atrial pressure of 15 mmHg. IAS/Shunts: No atrial level shunt detected by color flow Doppler.  LEFT VENTRICLE PLAX 2D LVIDd:         5.00 cm   Diastology LVIDs:         3.70 cm   LV e' medial:    9.90 cm/s LV PW:         0.90 cm   LV E/e' medial:  14.6 LV IVS:        0.90 cm   LV e' lateral:   9.36 cm/s LVOT diam:     2.20 cm   LV E/e' lateral: 15.5 LVOT Area:     3.80 cm  RIGHT VENTRICLE RV S prime:     11.10 cm/s TAPSE (M-mode): 2.0 cm LEFT ATRIUM              Index        RIGHT ATRIUM           Index LA diam:        5.70 cm  2.23 cm/m   RA Area:     27.70 cm LA Vol (A2C):   108.0 ml 42.33 ml/m  RA Volume:   85.40 ml  33.47 ml/m LA Vol (A4C):   100.0 ml 39.20 ml/m LA Biplane Vol: 105.0 ml 41.16 ml/m  AORTIC VALVE AV Vmax:      231.50 cm/s AV Vmean:     185.000 cm/s AV VTI:       0.616 m AV Peak Grad: 21.4 mmHg AV Mean Grad: 15.0 mmHg  AORTA Ao Root diam: 2.80 cm Ao Asc diam:  3.60 cm MITRAL VALVE                TRICUSPID VALVE MV Area (PHT): 3.95 cm     TR Peak grad:   24.6 mmHg MV  Decel Time: 192 msec     TR Mean grad:   14.0 mmHg MV E velocity: 145.00 cm/s  TR Vmax:        248.00 cm/s                             TR Vmean:       177.0 cm/s  SHUNTS                             Systemic Diam: 2.20 cm Thurmon Fair MD Electronically signed by Thurmon Fair MD Signature Date/Time: 03/07/2023/2:58:19 PM    Final    [place] ASCITES (ABDOMEN LIMITED)  Result Date: 03/06/2023 CLINICAL DATA:  Cirrhosis. EXAM: LIMITED ABDOMEN ULTRASOUND FOR ASCITES TECHNIQUE: Limited ultrasound survey for ascites was performed in all four abdominal quadrants. COMPARISON:  CT angiogram 09/30/2022.  Ultrasound 03/25/2022 FINDINGS: No significant ascites seen in the 4 quadrants of the abdomen by ultrasound. Study somewhat limited by overlapping bowel gas and soft tissue IMPRESSION: No significant pockets of ascites seen. Electronically Signed   By: Karen Kays M.D.   On: 03/06/2023 11:40        Scheduled Meds:  digoxin  0.125 mg Oral Daily   diltiazem  60 mg Oral Q8H   furosemide  40 mg Intravenous BID   insulin aspart  0-20 Units Subcutaneous TID WC   insulin aspart  0-5 Units Subcutaneous QHS   lactulose  10 g Oral TID   lidocaine  1 patch Transdermal Q24H   midodrine  10 mg Oral TID WC   rifaximin  550 mg Oral BID   Continuous Infusions:   ceFAZolin (ANCEF) IV 2 g (03/07/23 1502)     LOS: 2 days    Time spent: over 30 min    Lacretia Nicks, MD [facility]   To contact the attending provider between 7A-7P or the covering provider during after hours 7P-7A, please log into the web site [email or web address] and access using universal [place] password for that web site. If you do not have the password, please call the hospital operator.  03/07/2023, 6:10 PM

## 2023-03-07 NOTE — Progress Notes (Signed)
Rounding Note    Patient Name: Hannah Khan Date of Encounter: 03/07/2023  [facility] Cardiologist: Chilton Si, MD   Subjective   Remains mildly dyspneic; no CP.  Inpatient Medications    Scheduled Meds:  digoxin  0.125 mg Oral Daily   insulin aspart  0-20 Units Subcutaneous TID WC   insulin aspart  0-5 Units Subcutaneous QHS   lactulose  10 g Oral TID   lidocaine  1 patch Transdermal Q24H   midodrine  10 mg Oral TID WC   potassium chloride  40 mEq Oral Q4H   rifaximin  550 mg Oral BID   Continuous Infusions:   ceFAZolin (ANCEF) IV 2 g (03/07/23 0556)   diltiazem (CARDIZEM) infusion 10 mg/hr (03/07/23 0645)   PRN Meds: acetaminophen, albuterol, morphine injection, ondansetron **OR** ondansetron (ZOFRAN) IV   Vital Signs    Vitals:   03/07/23 0027 03/07/23 0531 03/07/23 0733 03/07/23 0836  BP: (!) 106/55 (!) 98/55 (!) 106/51 (!) 92/53  Pulse: 91 93 89   Resp: 18 18 20 18   Temp: 98.7 F (37.1 C) 98.1 F (36.7 C) 97.9 F (36.6 C)   TempSrc: Oral Oral Oral   SpO2: 96% 98% 96% 92%  Weight:  (!) 156.7 kg    Height:        Intake/Output Summary (Last 24 hours) at 03/07/2023 0907 Last data filed at 03/07/2023 0841 Gross per 24 hour  Intake 1135.82 ml  Output 750 ml  Net 385.82 ml       03/07/2023    5:31 AM 03/06/2023   12:02 AM 03/05/2023    3:37 PM  Last 3 Weights  Weight (lbs) 345 lb 8 oz 344 lb 9.6 oz 347 lb 3.2 oz  Weight (kg) 156.718 kg 156.31 kg 157.489 kg      Telemetry    Atrial fibrillation rate controlled - Personally Reviewed  Physical Exam   GEN: NAD, obese Neck: supple Cardiac: irregular, 2/6 systolic murmur Respiratory: CTA GI: Soft, NT/ND, cellulitis noted MS: 1+ edema Neuro:  Grossly intact Psych: Normal affect   Labs    Chemistry Recent Labs  Lab 03/05/23 1549 03/06/23 0104 03/07/23 0059  NA 136 134* 134*  K 4.2 3.3* 2.9*  CL 103 100 97*  CO2 23 22 24   GLUCOSE 149* 157* 161*  BUN 18 22 28*   CREATININE 1.38* 1.59* 1.68*  CALCIUM 8.4* 8.1* 8.4*  MG 1.8  --  1.8  PROT 5.9* 5.4* 5.7*  ALBUMIN 2.7* 2.3* 2.9*  AST 42* 35 37  ALT 21 22 22   ALKPHOS 71 67 52  BILITOT 5.0* 4.8* 4.2*  GFRNONAA 42* 35* 33*  ANIONGAP 10 12 13      Hematology Recent Labs  Lab 03/05/23 1549 03/06/23 0104 03/07/23 0059  WBC 7.4 7.3 2.4*  RBC 3.59* 3.32* 2.97*  HGB 11.7* 11.2* 9.8*  HCT 36.4 33.5* 28.6*  MCV 101.4* 100.9* 96.3  MCH 32.6 33.7 33.0  MCHC 32.1 33.4 34.3  RDW 15.5 15.7* 15.4  PLT 30* 20* 19*     BNP Recent Labs  Lab 03/05/23 1549  BNP 362.6*      Radiology    [place] ASCITES (ABDOMEN LIMITED)  Result Date: 03/06/2023 CLINICAL DATA:  Cirrhosis. EXAM: LIMITED ABDOMEN ULTRASOUND FOR ASCITES TECHNIQUE: Limited ultrasound survey for ascites was performed in all four abdominal quadrants. COMPARISON:  CT angiogram 09/30/2022.  Ultrasound 03/25/2022 FINDINGS: No significant ascites seen in the 4 quadrants of the abdomen by ultrasound. Study somewhat  limited by overlapping bowel gas and soft tissue IMPRESSION: No significant pockets of ascites seen. Electronically Signed   By: Karen Kays M.D.   On: 03/06/2023 11:40   DG Chest Portable 1 View  Result Date: 03/05/2023 CLINICAL DATA:  Shortness of breath EXAM: PORTABLE CHEST 1 VIEW COMPARISON:  11/12/2022 FINDINGS: Stable cardiomegaly. Aortic atherosclerosis. No focal airspace consolidation, pleural effusion, or pneumothorax. IMPRESSION: Cardiomegaly without acute cardiopulmonary disease. Electronically Signed   By: Duanne Guess D.O.   On: 03/05/2023 17:00      Patient Profile     67 y.o. female with past medical history of chronic diastolic congestive heart failure, permanent atrial fibrillation, sleep apnea, morbid obesity, cirrhosis complicated by portal hypertension, hypertension, diabetes mellitus being evaluated for atrial fibrillation and acute on chronic diastolic congestive heart failure.  Echocardiogram February 2024  showed normal LV function, mild left atrial enlargement.  Assessment & Plan    1 permanent atrial fibrillation-patient's heart rate has improved.  Will change Cardizem to 60 mg every 8 hours and if blood pressure tolerates transition to Cardizem CD 180 mg daily thereafter.  Continue digoxin.  Patient was initiated on midodrine for hypotension.  Also note we would like to avoid amiodarone in the setting of cirrhosis.  She is not anticoagulated due to chronic thrombocytopenia.    2 acute on chronic diastolic congestive heart failure-by her report her dry weight is 321 pounds.  She is presently 345 pounds.  Will begin Lasix 40 mg IV twice daily and follow renal function (has allergy to celebrex but has received lasix in the past).  Will transition to [facility] as volume status improves.  Given morbid obesity, pannus and active cellulitis she is not a candidate for SGLT2 inhibitor.   3 cellulitis-antibiotics per primary care  4 cirrhosis/thrombocytopenia/diabetes mellitus-Per primary care.  For questions or updates, please contact [facility] Please consult [email or web address] for contact info under        Signed, Olga Millers, MD  03/07/2023, 9:07 AM

## 2023-03-07 NOTE — Progress Notes (Signed)
Daily Progress Note   Patient Name: Hannah Khan       Date: 03/07/2023 DOB: [birth year]  Age: 67 y.o. MRN#: [id number] Attending Physician: Zigmund Daniel., * Primary Care Physician: Darrow Bussing, MD Admit Date: 03/05/2023  Reason for Consultation/Follow-up: Establishing goals of care  Subjective: Medical records reviewed including progress notes, labs, imaging. Patient assessed at the bedside.  She reports feeling a little better than yesterday.  Her husband Aurther Loft is present visiting.  Created space and opportunity for patient and family's thoughts and feelings on her current illness.  Aurther Loft is brought her updated advanced directives including HCPOA and living will documentation.  Hannah Khan is ready to complete a new MOST form with her current care preferences.   An extensive conversation was had, covering concepts specific to code status, artifical feeding and hydration, continued IV antibiotics and rehospitalization. Patient reiterates that she is now interested in full code/full scope treatment, given how much longer she has lived after being told she on only a few days.  Questions and concerns addressed. PMT will continue to support holistically.   Length of Stay: 2   Physical Exam Vitals and nursing note reviewed.  Constitutional:      General: She is not in acute distress. Pulmonary:     Effort: Pulmonary effort is normal. No tachypnea.  Skin:    General: Skin is warm.  Neurological:     Mental Status: She is alert and oriented to person, place, and time.  Psychiatric:        Mood and Affect: Mood normal.        Behavior: Behavior normal.            Vital Signs: BP (!) 92/53 (BP Location: Left Arm)   Pulse 89   Temp 97.9 F (36.6 C) (Oral)   Resp 18   Ht 5\' 7"   (1.702 m)   Wt (!) 156.7 kg   SpO2 92%   BMI 54.11 kg/m  SpO2: SpO2: 92 % O2 Device: O2 Device: Room Air O2 Flow Rate:        Palliative Assessment/Data: 50%   Palliative Care Assessment & Plan   Patient Profile: 67 y.o. female  with past medical history of multiple medical problems including morbid obesity, type 2 diabetes, history of recurrent GI bleed, Obstructive sleep apnea,  essential hypertension, atrial fibrillation, liver cirrhosis secondary to NASH, pancytopenia secondary to liver disease, chronic diastolic heart failure, portal hypertension as a result of cirrhosis admitted on 03/05/2023 with shortness of breath and cellulitis on her left abdominal wall.    Patient has been admitted 3 times in the past 6 months.  Her last discussion with PMT was in June 2022.  She is currently admitted for A-fib with RVR and severe sepsis due to abdominal wall cellulitis.  She also has hypotension in the setting of sepsis and liver cirrhosis.  PMT has been consulted to assist with goals of care conversation.  Assessment: Goals of care conversation A-fib, rate improving Acute on chronic diastolic CHF Cellulitis of the abdominal wall resulting in cellulitis Cirrhosis AKI  Recommendations/Plan: Continue full code/full scope treatment MOST form was completed today.  Will scan copy into EMR.  Also provided patient and family with copies Will scan updated copy of advance directives into EMR as well Patient is very hopeful for improvement Psychosocial and emotional support provided PMT will continue to follow and support   Prognosis:  Unable to determine  Discharge Planning: To Be Determined  Care plan was discussed with patient, patient's husband    MDM high         Hannah Khan Jeni Salles, PA-C  Palliative Medicine Team Team phone # [phone]  Thank you for allowing the Palliative Medicine Team to assist in the care of this patient. Please utilize secure chat with additional  questions, if there is no response within 30 minutes please call the above phone number.  Palliative Medicine Team providers are available by phone from 7am to 7pm daily and can be reached through the team cell phone.  Should this patient require assistance outside of these hours, please call the patient's attending physician.

## 2023-03-07 NOTE — Progress Notes (Signed)
PHARMACY - PHYSICIAN COMMUNICATION CRITICAL VALUE ALERT - BLOOD CULTURE IDENTIFICATION (BCID)  Hannah Khan is an 67 y.o. female who presented to [facility] on 03/05/2023 with a chief complaint of shortness of breath   Name of physician (or Provider) Contacted: Dr. Margo Aye  Current antibiotics: Cefazolin for cellulitis   Changes to prescribed antibiotics recommended:  No changes for now, likely contaminant   Results for orders placed or performed during the hospital encounter of 03/05/23  Blood Culture ID Panel (Reflexed) (Collected: 03/05/2023  4:34 PM)  Result Value Ref Range   Enterococcus faecalis NOT DETECTED NOT DETECTED   Enterococcus Faecium NOT DETECTED NOT DETECTED   Listeria monocytogenes NOT DETECTED NOT DETECTED   Staphylococcus species DETECTED (A) NOT DETECTED   Staphylococcus aureus (BCID) NOT DETECTED NOT DETECTED   Staphylococcus epidermidis DETECTED (A) NOT DETECTED   Staphylococcus lugdunensis NOT DETECTED NOT DETECTED   Streptococcus species NOT DETECTED NOT DETECTED   Streptococcus agalactiae NOT DETECTED NOT DETECTED   Streptococcus pneumoniae NOT DETECTED NOT DETECTED   Streptococcus pyogenes NOT DETECTED NOT DETECTED   A.calcoaceticus-baumannii NOT DETECTED NOT DETECTED   Bacteroides fragilis NOT DETECTED NOT DETECTED   Enterobacterales NOT DETECTED NOT DETECTED   Enterobacter cloacae complex NOT DETECTED NOT DETECTED   Escherichia coli NOT DETECTED NOT DETECTED   Klebsiella aerogenes NOT DETECTED NOT DETECTED   Klebsiella oxytoca NOT DETECTED NOT DETECTED   Klebsiella pneumoniae NOT DETECTED NOT DETECTED   Proteus species NOT DETECTED NOT DETECTED   Salmonella species NOT DETECTED NOT DETECTED   Serratia marcescens NOT DETECTED NOT DETECTED   Haemophilus influenzae NOT DETECTED NOT DETECTED   Neisseria meningitidis NOT DETECTED NOT DETECTED   Pseudomonas aeruginosa NOT DETECTED NOT DETECTED   Stenotrophomonas maltophilia NOT DETECTED NOT DETECTED    Candida albicans NOT DETECTED NOT DETECTED   Candida auris NOT DETECTED NOT DETECTED   Candida glabrata NOT DETECTED NOT DETECTED   Candida krusei NOT DETECTED NOT DETECTED   Candida parapsilosis NOT DETECTED NOT DETECTED   Candida tropicalis NOT DETECTED NOT DETECTED   Cryptococcus neoformans/gattii NOT DETECTED NOT DETECTED   Methicillin resistance mecA/C DETECTED (A) NOT DETECTED    Abran Duke 03/07/2023  4:41 AM

## 2023-03-07 NOTE — Plan of Care (Signed)
  Problem: Education: Goal: Ability to describe self-care measures that may prevent or decrease complications (Diabetes Survival Skills Education) will improve Outcome: Progressing Goal: Individualized Educational Video(s) Outcome: Progressing   Problem: Coping: Goal: Ability to adjust to condition or change in health will improve Outcome: Progressing   Problem: Fluid Volume: Goal: Ability to maintain a balanced intake and output will improve Outcome: Progressing   Problem: Health Behavior/Discharge Planning: Goal: Ability to manage health-related needs will improve Outcome: Progressing   Problem: Metabolic: Goal: Ability to maintain appropriate glucose levels will improve Outcome: Progressing   Problem: Nutritional: Goal: Maintenance of adequate nutrition will improve Outcome: Progressing   Problem: Skin Integrity: Goal: Risk for impaired skin integrity will decrease Outcome: Progressing

## 2023-03-07 NOTE — Progress Notes (Signed)
  Echocardiogram 2D Echocardiogram has been performed.  Milda Smart 03/07/2023, 2:31 PM

## 2023-03-08 DIAGNOSIS — L03311 Cellulitis of abdominal wall: Secondary | ICD-10-CM | POA: Diagnosis not present

## 2023-03-08 DIAGNOSIS — I4821 Permanent atrial fibrillation: Secondary | ICD-10-CM | POA: Diagnosis not present

## 2023-03-08 DIAGNOSIS — E876 Hypokalemia: Secondary | ICD-10-CM | POA: Diagnosis not present

## 2023-03-08 DIAGNOSIS — I5031 Acute diastolic (congestive) heart failure: Secondary | ICD-10-CM | POA: Diagnosis not present

## 2023-03-08 DIAGNOSIS — I4891 Unspecified atrial fibrillation: Secondary | ICD-10-CM | POA: Diagnosis not present

## 2023-03-08 LAB — CULTURE, BLOOD (ROUTINE X 2): Special Requests: ADEQUATE

## 2023-03-08 LAB — CBC
HCT: 29.3 % — ABNORMAL LOW (ref 36.0–46.0)
Hemoglobin: 10 g/dL — ABNORMAL LOW (ref 12.0–15.0)
MCH: 32.9 pg (ref 26.0–34.0)
MCHC: 34.1 g/dL (ref 30.0–36.0)
MCV: 96.4 fL (ref 80.0–100.0)
Platelets: 25 10*3/uL — CL (ref 150–400)
RBC: 3.04 MIL/uL — ABNORMAL LOW (ref 3.87–5.11)
RDW: 15 % (ref 11.5–15.5)
WBC: 2.5 10*3/uL — ABNORMAL LOW (ref 4.0–10.5)
nRBC: 0 % (ref 0.0–0.2)

## 2023-03-08 LAB — BASIC METABOLIC PANEL
Anion gap: 11 (ref 5–15)
BUN: 25 mg/dL — ABNORMAL HIGH (ref 8–23)
CO2: 25 mmol/L (ref 22–32)
Calcium: 8.3 mg/dL — ABNORMAL LOW (ref 8.9–10.3)
Chloride: 100 mmol/L (ref 98–111)
Creatinine, Ser: 1.37 mg/dL — ABNORMAL HIGH (ref 0.44–1.00)
GFR, Estimated: 42 mL/min — ABNORMAL LOW (ref >60)
Glucose, Bld: 109 mg/dL — ABNORMAL HIGH (ref 70–99)
Potassium: 2.8 mmol/L — ABNORMAL LOW (ref 3.5–5.1)
Sodium: 136 mmol/L (ref 135–145)

## 2023-03-08 LAB — GLUCOSE, CAPILLARY
Glucose-Capillary: 97 mg/dL (ref 70–99)
Glucose-Capillary: 115 mg/dL — ABNORMAL HIGH (ref 70–99)
Glucose-Capillary: 119 mg/dL — ABNORMAL HIGH (ref 70–99)
Glucose-Capillary: 103 mg/dL — ABNORMAL HIGH (ref 70–99)

## 2023-03-08 LAB — MAGNESIUM: Magnesium: 1.8 mg/dL (ref 1.7–2.4)

## 2023-03-08 LAB — PHOSPHORUS: Phosphorus: 2.8 mg/dL (ref 2.5–4.6)

## 2023-03-08 MED ORDER — POTASSIUM CHLORIDE CRYS ER 20 MEQ PO TBCR
40.0000 meq | EXTENDED_RELEASE_TABLET | ORAL | Status: AC
  Administered 2023-03-08 (×3): 40 meq via ORAL
  Filled 2023-03-08 (×3): qty 2

## 2023-03-08 MED ORDER — DILTIAZEM HCL ER COATED BEADS 180 MG PO CP24
180.0000 mg | ORAL_CAPSULE | Freq: Every day | ORAL | Status: DC
  Administered 2023-03-08: 180 mg via ORAL
  Filled 2023-03-08: qty 1

## 2023-03-08 NOTE — Plan of Care (Signed)
  Problem: Education: Goal: Ability to describe self-care measures that may prevent or decrease complications (Diabetes Survival Skills Education) will improve Outcome: Progressing   Problem: Coping: Goal: Ability to adjust to condition or change in health will improve Outcome: Progressing   Problem: Fluid Volume: Goal: Ability to maintain a balanced intake and output will improve Outcome: Progressing   Problem: Health Behavior/Discharge Planning: Goal: Ability to identify and utilize available resources and services will improve Outcome: Progressing   Problem: Skin Integrity: Goal: Risk for impaired skin integrity will decrease Outcome: Progressing   Problem: Tissue Perfusion: Goal: Adequacy of tissue perfusion will improve Outcome: Progressing   

## 2023-03-08 NOTE — Progress Notes (Signed)
Pt up to [facility]. Pt HR up to 150s. Pt stated she felt lightheaded and dizzy. Pt back to bed. HR down to 120s. BP 102/54. Temp 98.5. On call cardiologist paged.   0050 PT HR elevated BP 98/52 manual.  Cardiologist to keep patient in bed and calm and medication adjustments may be necessary later today.   0145 PT called out c/o nausea, chest pressure, and generalized malaise. HR still elevated. 120s BP 111/47. Chest pressure has now resolved. Cardiologist paged.  New orders placed.

## 2023-03-08 NOTE — Progress Notes (Signed)
Rounding Note    Patient Name: Hannah Khan Date of Encounter: 03/08/2023  [facility] Cardiologist: Chilton Si, MD   Subjective   No CP; mild dyspnea  Inpatient Medications    Scheduled Meds:  digoxin  0.125 mg Oral Daily   diltiazem  60 mg Oral Q8H   furosemide  40 mg Intravenous BID   insulin aspart  0-20 Units Subcutaneous TID WC   insulin aspart  0-5 Units Subcutaneous QHS   lactulose  10 g Oral TID   lidocaine  1 patch Transdermal Q24H   midodrine  10 mg Oral TID WC   rifaximin  550 mg Oral BID   Continuous Infusions:   ceFAZolin (ANCEF) IV 2 g (03/08/23 0532)   PRN Meds: acetaminophen, albuterol, morphine injection, ondansetron **OR** ondansetron (ZOFRAN) IV   Vital Signs    Vitals:   03/07/23 2015 03/07/23 2353 03/08/23 0408 03/08/23 0717  BP: (!) 116/55 119/66 (!) 110/52 (!) 105/56  Pulse: 88 (!) 101 91 91  Resp: 18 18 20 20   Temp: 98 F (36.7 C) 98.5 F (36.9 C) 98.7 F (37.1 C) 98.7 F (37.1 C)  TempSrc: Oral Oral Oral Oral  SpO2: 99% 97%  100%  Weight:   (!) 156.7 kg   Height:        Intake/Output Summary (Last 24 hours) at 03/08/2023 0846 Last data filed at 03/08/2023 0533 Gross per 24 hour  Intake 716 ml  Output 4500 ml  Net -3784 ml       03/08/2023    4:08 AM 03/07/2023    5:31 AM 03/06/2023   12:02 AM  Last 3 Weights  Weight (lbs) 345 lb 7.4 oz 345 lb 8 oz 344 lb 9.6 oz  Weight (kg) 156.7 kg 156.718 kg 156.31 kg      Telemetry    Atrial fibrillation rate controlled - Personally Reviewed  Physical Exam   GEN: NAD, obese, WD Neck: supple, no adenopathy noted Cardiac: irregular, 2/6 systolic murmur, no rub Respiratory: CTA; no wheeze GI: Soft, NT/ND, cellulitis noted (improving) MS: 1+ edema Neuro:  No focal findings Psych: Normal affect   Labs    Chemistry Recent Labs  Lab 03/05/23 1549 03/06/23 0104 03/07/23 0059 03/08/23 0139  NA 136 134* 134* 136  K 4.2 3.3* 2.9* 2.8*  CL 103 100 97* 100   CO2 23 22 24 25   GLUCOSE 149* 157* 161* 109*  BUN 18 22 28* 25*  CREATININE 1.38* 1.59* 1.68* 1.37*  CALCIUM 8.4* 8.1* 8.4* 8.3*  MG 1.8  --  1.8 1.8  PROT 5.9* 5.4* 5.7*  --   ALBUMIN 2.7* 2.3* 2.9*  --   AST 42* 35 37  --   ALT 21 22 22   --   ALKPHOS 71 67 52  --   BILITOT 5.0* 4.8* 4.2*  --   GFRNONAA 42* 35* 33* 42*  ANIONGAP 10 12 13 11      Hematology Recent Labs  Lab 03/06/23 0104 03/07/23 0059 03/08/23 0139  WBC 7.3 2.4* 2.5*  RBC 3.32* 2.97* 3.04*  HGB 11.2* 9.8* 10.0*  HCT 33.5* 28.6* 29.3*  MCV 100.9* 96.3 96.4  MCH 33.7 33.0 32.9  MCHC 33.4 34.3 34.1  RDW 15.7* 15.4 15.0  PLT 20* 19* 25*     BNP Recent Labs  Lab 03/05/23 1549  BNP 362.6*      Radiology    ECHOCARDIOGRAM COMPLETE  Result Date: 03/07/2023    ECHOCARDIOGRAM REPORT   Patient  Name:   Hannah Khan Date of Exam: 03/07/2023 Medical Rec #:  [id number]       Height:       67.0 in Accession #:    [id number]      Weight:       345.5 lb Date of Birth:  1956/06/21       BSA:          2.551 m Patient Age:    67 years        BP:           112/59 mmHg Patient Gender: F               HR:           83 bpm. Exam Location:  Inpatient Procedure: 2D Echo, Cardiac Doppler, Color Doppler and Intracardiac            Opacification Agent Indications:    CHF- Diastolic  History:        Patient has prior history of Echocardiogram examinations, most                 recent 11/01/2022. CHF, Arrythmias:Atrial Fibrillation,                 Signs/Symptoms:Shortness of Breath and Hypotension; Risk                 Factors:Sleep Apnea, Diabetes, Hypertension and Morbid obesity.                 Liver cirrhosis secondary to NASH, portal HTN.  Sonographer:    Milda Smart Referring Phys: [phone] A CALDWELL POWELL JR  Sonographer Comments: Patient is obese and Technically difficult study due to poor echo windows. Image acquisition challenging due to patient body habitus and Image acquisition challenging due to respiratory motion.  IMPRESSIONS  1. Left ventricular ejection fraction, by estimation, is 55 to 60%. The left ventricle has normal function. The left ventricle has no regional wall motion abnormalities. Left ventricular diastolic function could not be evaluated.  2. Right ventricular systolic function is normal. The right ventricular size is normal. There is mildly elevated pulmonary artery systolic pressure. The estimated right ventricular systolic pressure is 39.6 mmHg.  3. Left atrial size was severely dilated.  4. Right atrial size was severely dilated.  5. The mitral valve is normal in structure. No evidence of mitral valve regurgitation.  6. Tricuspid valve regurgitation is moderate.  7. The aortic valve is tricuspid. There is moderate calcification of the aortic valve. There is moderate thickening of the aortic valve. Aortic valve regurgitation is not visualized. Mild to moderate aortic valve stenosis.  8. The inferior vena cava is dilated in size with <50% respiratory variability, suggesting right atrial pressure of 15 mmHg. Comparison(s): No significant change from prior study. Prior images reviewed side by side. FINDINGS  Left Ventricle: Left ventricular ejection fraction, by estimation, is 55 to 60%. The left ventricle has normal function. The left ventricle has no regional wall motion abnormalities. Definity contrast agent was given IV to delineate the left ventricular  endocardial borders. The left ventricular internal cavity size was normal in size. There is no left ventricular hypertrophy. Left ventricular diastolic function could not be evaluated due to atrial fibrillation. Left ventricular diastolic function could  not be evaluated. Right Ventricle: The right ventricular size is normal. No increase in right ventricular wall thickness. Right ventricular systolic function is normal. There is mildly elevated pulmonary artery systolic pressure. The tricuspid regurgitant  velocity is 2.48  m/s, and with an assumed right  atrial pressure of 15 mmHg, the estimated right ventricular systolic pressure is 39.6 mmHg. Left Atrium: Left atrial size was severely dilated. Right Atrium: Right atrial size was severely dilated. Pericardium: There is no evidence of pericardial effusion. Mitral Valve: The mitral valve is normal in structure. No evidence of mitral valve regurgitation. Tricuspid Valve: The tricuspid valve is grossly normal. Tricuspid valve regurgitation is moderate. Aortic Valve: The aortic valve is tricuspid. There is moderate calcification of the aortic valve. There is moderate thickening of the aortic valve. Aortic valve regurgitation is not visualized. Mild to moderate aortic stenosis is present. Aortic valve mean gradient measures 15.0 mmHg. Aortic valve peak gradient measures 21.4 mmHg. Pulmonic Valve: The pulmonic valve was not well visualized. Pulmonic valve regurgitation is not visualized. No evidence of pulmonic stenosis. Aorta: The aortic root and ascending aorta are structurally normal, with no evidence of dilitation. Venous: The inferior vena cava is dilated in size with less than 50% respiratory variability, suggesting right atrial pressure of 15 mmHg. IAS/Shunts: No atrial level shunt detected by color flow Doppler.  LEFT VENTRICLE PLAX 2D LVIDd:         5.00 cm   Diastology LVIDs:         3.70 cm   LV e' medial:    9.90 cm/s LV PW:         0.90 cm   LV E/e' medial:  14.6 LV IVS:        0.90 cm   LV e' lateral:   9.36 cm/s LVOT diam:     2.20 cm   LV E/e' lateral: 15.5 LVOT Area:     3.80 cm  RIGHT VENTRICLE RV S prime:     11.10 cm/s TAPSE (M-mode): 2.0 cm LEFT ATRIUM              Index        RIGHT ATRIUM           Index LA diam:        5.70 cm  2.23 cm/m   RA Area:     27.70 cm LA Vol (A2C):   108.0 ml 42.33 ml/m  RA Volume:   85.40 ml  33.47 ml/m LA Vol (A4C):   100.0 ml 39.20 ml/m LA Biplane Vol: 105.0 ml 41.16 ml/m  AORTIC VALVE AV Vmax:      231.50 cm/s AV Vmean:     185.000 cm/s AV VTI:       0.616 m AV  Peak Grad: 21.4 mmHg AV Mean Grad: 15.0 mmHg  AORTA Ao Root diam: 2.80 cm Ao Asc diam:  3.60 cm MITRAL VALVE                TRICUSPID VALVE MV Area (PHT): 3.95 cm     TR Peak grad:   24.6 mmHg MV Decel Time: 192 msec     TR Mean grad:   14.0 mmHg MV E velocity: 145.00 cm/s  TR Vmax:        248.00 cm/s                             TR Vmean:       177.0 cm/s                              SHUNTS  Systemic Diam: 2.20 cm Thurmon Fair MD Electronically signed by Thurmon Fair MD Signature Date/Time: 03/07/2023/2:58:19 PM    Final    [place] ASCITES (ABDOMEN LIMITED)  Result Date: 03/06/2023 CLINICAL DATA:  Cirrhosis. EXAM: LIMITED ABDOMEN ULTRASOUND FOR ASCITES TECHNIQUE: Limited ultrasound survey for ascites was performed in all four abdominal quadrants. COMPARISON:  CT angiogram 09/30/2022.  Ultrasound 03/25/2022 FINDINGS: No significant ascites seen in the 4 quadrants of the abdomen by ultrasound. Study somewhat limited by overlapping bowel gas and soft tissue IMPRESSION: No significant pockets of ascites seen. Electronically Signed   By: Karen Kays M.D.   On: 03/06/2023 11:40      Patient Profile     67 y.o. female with past medical history of chronic diastolic congestive heart failure, permanent atrial fibrillation, sleep apnea, morbid obesity, cirrhosis complicated by portal hypertension, hypertension, diabetes mellitus being evaluated for atrial fibrillation and acute on chronic diastolic congestive heart failure. Echocardiogram this admission shows normal LV function, severe biatrial enlargement, moderate tricuspid regurgitation and mild to moderate aortic stenosis with mean gradient 15 mmHg.  Assessment & Plan    1 permanent atrial fibrillation-heart rate is controlled today.  Will transition Cardizem to CD 180 mg daily.  Continue digoxin.  No anticoagulation given thrombocytopenia.    2 acute on chronic diastolic congestive heart failure-patient states she feels much  better today.  Significant diuresis.  Will continue Lasix 40 mg IV twice daily and likely transition to [facility] tomorrow.  Given morbid obesity, pannus and active cellulitis she is not a candidate for SGLT2 inhibitor.    3 mild to moderate aortic stenosis-will need follow-up echoes in the future.  4 cellulitis-antibiotics per primary care  5 cirrhosis/thrombocytopenia/diabetes mellitus-Per primary care.  6 hypokalemia-supplement  For questions or updates, please contact [facility] Please consult [email or web address] for contact info under        Signed, Olga Millers, MD  03/08/2023, 8:46 AM

## 2023-03-08 NOTE — Progress Notes (Signed)
PROGRESS NOTE    Hannah Khan  ZOX:[id number] DOB: August 26, 1956 DOA: 03/05/2023 PCP: Darrow Bussing, MD  Chief Complaint  Patient presents with   Shortness of Breath    Brief Narrative:  Hannah Khan is a 67 y.o. female with medical history significant of multiple medical problems including morbid obesity, type 2 diabetes, history of recurrent GI bleed, Obstructive sleep apnea, essential hypertension, atrial fibrillation, liver cirrhosis secondary to NASH, pancytopenia secondary to liver disease, chronic diastolic heart failure, portal hypertension as a result of cirrhosis, who presents with husband complaining of shortness of breath.  She was admitted with abdominal cellulitis and atrial fibrillation with RVR.  Started on abx.    Assessment & Plan:   Hypotension Appears to be chronic based on previous trends. Started on midodrine considering her liver cirrhosis and chronic hypotension.  She was also given albumin. Continue to monitor closely.    Abdominal wall cellulitis with sepsis  Sepsis was ruled in with fever, tachycardia Blood cultures -> growing staph epidermidis, suspect this is a contaminant  Patient started on cefazolin with improvement in her cellulitis.  No obvious open wounds noted.  She apparently has a previous history of same.  Continue to monitor.    Shortness of Breath Patient did have dyspnea at presentation.  Could have been due to atrial fibrillation.  Likely multifactorial including A-fib, morbid obesity, cirrhosis with some fluid overload.  Remains on diuretics.    Atrial Fibrillation with RVR Cardiology following.  Being transition to long-acting Cardizem today.  Not on metoprolol currently. Not a candidate for anticoagulation due to severe thrombocytopenia.   Also on digoxin.  NASH Cirrhosis/Severe Thrombocytopenia  Continue xifaxan, lactulose Apparently plan for TIPs which was aborted due to elevated right heart pressures 12/2022 Has a history of  grade 1 esophageal varices (04/2022) -> beta blocker currently on hold with hypotension.  Acute on chronic diastolic CHF  Echocardiogram showed normal left ventricular systolic function.  Diastolic function could not be ascertained.  Remains on IV furosemide.  Monitor ins and outs and daily weights.   Agree with Dr. [name] regarding stopping jardiance given her recurrent cellulitis of her pannus   AKI/hypokalemia In the setting of acute infection, sepsis and diuretics.  Renal function is stable.  Continue to monitor. Follow with albumin, diuresis per cards Replace potassium.  Magnesium is 1.8.  T2DM Currently on SSI On jardiance outpatient - this should be discontinued Uses GLP 1 agonist outpatient    OSA CPAP  Chronic Pain  DVT prophylaxis: severe thrombocytopenia, none Code Status: full Family Communication: none Disposition: Hopefully return home when improved   Consultants:  cardiology  Procedures:  none  Antimicrobials:  Anti-infectives (From admission, onward)    Start     Dose/Rate Route Frequency Ordered Stop   03/06/23 1400  ceFAZolin (ANCEF) IVPB 2g/100 mL premix        2 g 200 mL/hr over 30 Minutes Intravenous Every 8 hours 03/06/23 1052 03/13/23 1359   03/06/23 1200  rifaximin (XIFAXAN) tablet 550 mg        550 mg Oral 2 times daily 03/06/23 1054     03/05/23 2100  cefTRIAXone (ROCEPHIN) 2 g in sodium chloride 0.9 % 100 mL IVPB  Status:  Discontinued        2 g 200 mL/hr over 30 Minutes Intravenous Every 24 hours 03/05/23 2051 03/05/23 2053   03/05/23 1630  cefTRIAXone (ROCEPHIN) 2 g in sodium chloride 0.9 % 100 mL IVPB  Status:  Discontinued  2 g 200 mL/hr over 30 Minutes Intravenous Every 24 hours 03/05/23 1629 03/06/23 1052       Subjective: Overall seems to be doing better than before.  Denies any chest pain.  Urinating well.  Objective: Vitals:   03/07/23 2015 03/07/23 2353 03/08/23 0408 03/08/23 0717  BP: (!) 116/55 119/66 (!) 110/52  (!) 105/56  Pulse: 88 (!) 101 91 91  Resp: 18 18 20 20   Temp: 98 F (36.7 C) 98.5 F (36.9 C) 98.7 F (37.1 C) 98.7 F (37.1 C)  TempSrc: Oral Oral Oral Oral  SpO2: 99% 97%  100%  Weight:   (!) 156.7 kg   Height:        Intake/Output Summary (Last 24 hours) at 03/08/2023 0919 Last data filed at 03/08/2023 0533 Gross per 24 hour  Intake 716 ml  Output 4500 ml  Net -3784 ml    Filed Weights   03/06/23 0002 03/07/23 0531 03/08/23 0408  Weight: (!) 156.3 kg (!) 156.7 kg (!) 156.7 kg    Examination:  General appearance: Awake alert.  In no distress.  Morbidly obese Resp: Clear to auscultation bilaterally.  Normal effort Cardio: S1-S2 is normal regular.  No S3-S4.  No rubs murmurs or bruit GI: Abdomen is soft.  Erythema over lower abdominal wall seems to be improving.  She has a pannus. Extremities: 1+ edema bilateral lower extremities Neurologic: Alert and oriented x3.  No focal neurological deficits.    Data Reviewed: I have personally reviewed following labs and reports of imaging studies  CBC: Recent Labs  Lab 03/05/23 1549 03/06/23 0104 03/07/23 0059 03/08/23 0139  WBC 7.4 7.3 2.4* 2.5*  NEUTROABS 6.7  --  1.7  --   HGB 11.7* 11.2* 9.8* 10.0*  HCT 36.4 33.5* 28.6* 29.3*  MCV 101.4* 100.9* 96.3 96.4  PLT 30* 20* 19* 25*     Basic Metabolic Panel: Recent Labs  Lab 03/05/23 1549 03/06/23 0104 03/07/23 0059 03/08/23 0139  NA 136 134* 134* 136  K 4.2 3.3* 2.9* 2.8*  CL 103 100 97* 100  CO2 23 22 24 25   GLUCOSE 149* 157* 161* 109*  BUN 18 22 28* 25*  CREATININE 1.38* 1.59* 1.68* 1.37*  CALCIUM 8.4* 8.1* 8.4* 8.3*  MG 1.8  --  1.8 1.8  PHOS  --   --  2.9 2.8     GFR: Estimated Creatinine Clearance: 62.7 mL/min (A) (by C-G formula based on SCr of 1.37 mg/dL (H)).  Liver Function Tests: Recent Labs  Lab 03/05/23 1549 03/06/23 0104 03/07/23 0059  AST 42* 35 37  ALT 21 22 22   ALKPHOS 71 67 52  BILITOT 5.0* 4.8* 4.2*  PROT 5.9* 5.4* 5.7*   ALBUMIN 2.7* 2.3* 2.9*     CBG: Recent Labs  Lab 03/07/23 0600 03/07/23 1126 03/07/23 1608 03/07/23 2232 03/08/23 0618  GLUCAP 113* 103* 104* 138* 97      Recent Results (from the past 240 hour(s))  Blood Culture (routine x 2)     Status: None (Preliminary result)   Collection Time: 03/05/23  3:49 PM   Specimen: BLOOD  Result Value Ref Range Status   Specimen Description BLOOD BLOOD RIGHT HAND  Final   Special Requests   Final    BOTTLES DRAWN AEROBIC AND ANAEROBIC Blood Culture results may not be optimal due to an inadequate volume of blood received in culture bottles   Culture   Final    NO GROWTH 3 DAYS Performed at [facility]  Lab, [address], [place], [place] [address]    Report Status PENDING  Incomplete  Blood Culture (routine x 2)     Status: Abnormal   Collection Time: 03/05/23  4:34 PM   Specimen: BLOOD  Result Value Ref Range Status   Specimen Description BLOOD SITE NOT SPECIFIED  Final   Special Requests   Final    BOTTLES DRAWN AEROBIC ONLY Blood Culture adequate volume   Culture  Setup Time   Final    GRAM POSITIVE COCCI IN CLUSTERS AEROBIC BOTTLE ONLY CRITICAL RESULT CALLED TO, READ BACK BY AND VERIFIED WITH: PHARMD J. LEDFORD 03/07/23 @ 0424 BY AB    Culture (A)  Final    STAPHYLOCOCCUS EPIDERMIDIS THE SIGNIFICANCE OF ISOLATING THIS ORGANISM FROM A SINGLE SET OF BLOOD CULTURES WHEN MULTIPLE SETS ARE DRAWN IS UNCERTAIN. PLEASE NOTIFY THE MICROBIOLOGY DEPARTMENT WITHIN ONE WEEK IF SPECIATION AND SENSITIVITIES ARE REQUIRED. Performed at [facility] Lab, [address] [place], [place] [address]    Report Status 03/08/2023 FINAL  Final  Blood Culture ID Panel (Reflexed)     Status: Abnormal   Collection Time: 03/05/23  4:34 PM  Result Value Ref Range Status   Enterococcus faecalis NOT DETECTED NOT DETECTED Final   Enterococcus Faecium NOT DETECTED NOT DETECTED Final   Listeria monocytogenes NOT DETECTED NOT DETECTED Final   Staphylococcus  species DETECTED (A) NOT DETECTED Final    Comment: CRITICAL RESULT CALLED TO, READ BACK BY AND VERIFIED WITH: PHARMD J. LEDFORD 03/07/23 @ 0424 BY AB    Staphylococcus aureus (BCID) NOT DETECTED NOT DETECTED Final   Staphylococcus epidermidis DETECTED (A) NOT DETECTED Final    Comment: Methicillin (oxacillin) resistant coagulase negative staphylococcus. Possible blood culture contaminant (unless isolated from more than one blood culture draw or clinical case suggests pathogenicity). No antibiotic treatment is indicated for blood  culture contaminants. CRITICAL RESULT CALLED TO, READ BACK BY AND VERIFIED WITH: PHARMD J. LEDFORD 03/07/23 @ 0424 BY AB    Staphylococcus lugdunensis NOT DETECTED NOT DETECTED Final   Streptococcus species NOT DETECTED NOT DETECTED Final   Streptococcus agalactiae NOT DETECTED NOT DETECTED Final   Streptococcus pneumoniae NOT DETECTED NOT DETECTED Final   Streptococcus pyogenes NOT DETECTED NOT DETECTED Final   A.calcoaceticus-baumannii NOT DETECTED NOT DETECTED Final   Bacteroides fragilis NOT DETECTED NOT DETECTED Final   Enterobacterales NOT DETECTED NOT DETECTED Final   Enterobacter cloacae complex NOT DETECTED NOT DETECTED Final   Escherichia coli NOT DETECTED NOT DETECTED Final   Klebsiella aerogenes NOT DETECTED NOT DETECTED Final   Klebsiella oxytoca NOT DETECTED NOT DETECTED Final   Klebsiella pneumoniae NOT DETECTED NOT DETECTED Final   Proteus species NOT DETECTED NOT DETECTED Final   Salmonella species NOT DETECTED NOT DETECTED Final   Serratia marcescens NOT DETECTED NOT DETECTED Final   Haemophilus influenzae NOT DETECTED NOT DETECTED Final   Neisseria meningitidis NOT DETECTED NOT DETECTED Final   Pseudomonas aeruginosa NOT DETECTED NOT DETECTED Final   Stenotrophomonas maltophilia NOT DETECTED NOT DETECTED Final   Candida albicans NOT DETECTED NOT DETECTED Final   Candida auris NOT DETECTED NOT DETECTED Final   Candida glabrata NOT DETECTED  NOT DETECTED Final   Candida krusei NOT DETECTED NOT DETECTED Final   Candida parapsilosis NOT DETECTED NOT DETECTED Final   Candida tropicalis NOT DETECTED NOT DETECTED Final   Cryptococcus neoformans/gattii NOT DETECTED NOT DETECTED Final   Methicillin resistance mecA/C DETECTED (A) NOT DETECTED Final    Comment: CRITICAL  RESULT CALLED TO, READ BACK BY AND VERIFIED WITH: PHARMD J. LEDFORD 03/07/23 @ 0424 BY AB Performed at [facility] Lab, [address], [place], [place] [address]        Radiology Studies: ECHOCARDIOGRAM COMPLETE  Result Date: 03/07/2023    ECHOCARDIOGRAM REPORT   Patient Name:   NELI RHEAD Date of Exam: 03/07/2023 Medical Rec #:  [id number]       Height:       67.0 in Accession #:    [id number]      Weight:       345.5 lb Date of Birth:  01-08-1956       BSA:          2.551 m Patient Age:    67 years        BP:           112/59 mmHg Patient Gender: F               HR:           83 bpm. Exam Location:  Inpatient Procedure: 2D Echo, Cardiac Doppler, Color Doppler and Intracardiac            Opacification Agent Indications:    CHF- Diastolic  History:        Patient has prior history of Echocardiogram examinations, most                 recent 11/01/2022. CHF, Arrythmias:Atrial Fibrillation,                 Signs/Symptoms:Shortness of Breath and Hypotension; Risk                 Factors:Sleep Apnea, Diabetes, Hypertension and Morbid obesity.                 Liver cirrhosis secondary to NASH, portal HTN.  Sonographer:    Milda Smart Referring Phys: [phone] A CALDWELL POWELL JR  Sonographer Comments: Patient is obese and Technically difficult study due to poor echo windows. Image acquisition challenging due to patient body habitus and Image acquisition challenging due to respiratory motion. IMPRESSIONS  1. Left ventricular ejection fraction, by estimation, is 55 to 60%. The left ventricle has normal function. The left ventricle has no regional wall motion abnormalities. Left  ventricular diastolic function could not be evaluated.  2. Right ventricular systolic function is normal. The right ventricular size is normal. There is mildly elevated pulmonary artery systolic pressure. The estimated right ventricular systolic pressure is 39.6 mmHg.  3. Left atrial size was severely dilated.  4. Right atrial size was severely dilated.  5. The mitral valve is normal in structure. No evidence of mitral valve regurgitation.  6. Tricuspid valve regurgitation is moderate.  7. The aortic valve is tricuspid. There is moderate calcification of the aortic valve. There is moderate thickening of the aortic valve. Aortic valve regurgitation is not visualized. Mild to moderate aortic valve stenosis.  8. The inferior vena cava is dilated in size with <50% respiratory variability, suggesting right atrial pressure of 15 mmHg. Comparison(s): No significant change from prior study. Prior images reviewed side by side. FINDINGS  Left Ventricle: Left ventricular ejection fraction, by estimation, is 55 to 60%. The left ventricle has normal function. The left ventricle has no regional wall motion abnormalities. Definity contrast agent was given IV to delineate the left ventricular  endocardial borders. The left ventricular internal cavity size was normal in size. There is no left ventricular hypertrophy.  Left ventricular diastolic function could not be evaluated due to atrial fibrillation. Left ventricular diastolic function could  not be evaluated. Right Ventricle: The right ventricular size is normal. No increase in right ventricular wall thickness. Right ventricular systolic function is normal. There is mildly elevated pulmonary artery systolic pressure. The tricuspid regurgitant velocity is 2.48  m/s, and with an assumed right atrial pressure of 15 mmHg, the estimated right ventricular systolic pressure is 39.6 mmHg. Left Atrium: Left atrial size was severely dilated. Right Atrium: Right atrial size was severely  dilated. Pericardium: There is no evidence of pericardial effusion. Mitral Valve: The mitral valve is normal in structure. No evidence of mitral valve regurgitation. Tricuspid Valve: The tricuspid valve is grossly normal. Tricuspid valve regurgitation is moderate. Aortic Valve: The aortic valve is tricuspid. There is moderate calcification of the aortic valve. There is moderate thickening of the aortic valve. Aortic valve regurgitation is not visualized. Mild to moderate aortic stenosis is present. Aortic valve mean gradient measures 15.0 mmHg. Aortic valve peak gradient measures 21.4 mmHg. Pulmonic Valve: The pulmonic valve was not well visualized. Pulmonic valve regurgitation is not visualized. No evidence of pulmonic stenosis. Aorta: The aortic root and ascending aorta are structurally normal, with no evidence of dilitation. Venous: The inferior vena cava is dilated in size with less than 50% respiratory variability, suggesting right atrial pressure of 15 mmHg. IAS/Shunts: No atrial level shunt detected by color flow Doppler.  LEFT VENTRICLE PLAX 2D LVIDd:         5.00 cm   Diastology LVIDs:         3.70 cm   LV e' medial:    9.90 cm/s LV PW:         0.90 cm   LV E/e' medial:  14.6 LV IVS:        0.90 cm   LV e' lateral:   9.36 cm/s LVOT diam:     2.20 cm   LV E/e' lateral: 15.5 LVOT Area:     3.80 cm  RIGHT VENTRICLE RV S prime:     11.10 cm/s TAPSE (M-mode): 2.0 cm LEFT ATRIUM              Index        RIGHT ATRIUM           Index LA diam:        5.70 cm  2.23 cm/m   RA Area:     27.70 cm LA Vol (A2C):   108.0 ml 42.33 ml/m  RA Volume:   85.40 ml  33.47 ml/m LA Vol (A4C):   100.0 ml 39.20 ml/m LA Biplane Vol: 105.0 ml 41.16 ml/m  AORTIC VALVE AV Vmax:      231.50 cm/s AV Vmean:     185.000 cm/s AV VTI:       0.616 m AV Peak Grad: 21.4 mmHg AV Mean Grad: 15.0 mmHg  AORTA Ao Root diam: 2.80 cm Ao Asc diam:  3.60 cm MITRAL VALVE                TRICUSPID VALVE MV Area (PHT): 3.95 cm     TR Peak grad:   24.6  mmHg MV Decel Time: 192 msec     TR Mean grad:   14.0 mmHg MV E velocity: 145.00 cm/s  TR Vmax:        248.00 cm/s  TR Vmean:       177.0 cm/s                              SHUNTS                             Systemic Diam: 2.20 cm Rachelle Hora Croitoru MD Electronically signed by Thurmon Fair MD Signature Date/Time: 03/07/2023/2:58:19 PM    Final    [place] ASCITES (ABDOMEN LIMITED)  Result Date: 03/06/2023 CLINICAL DATA:  Cirrhosis. EXAM: LIMITED ABDOMEN ULTRASOUND FOR ASCITES TECHNIQUE: Limited ultrasound survey for ascites was performed in all four abdominal quadrants. COMPARISON:  CT angiogram 09/30/2022.  Ultrasound 03/25/2022 FINDINGS: No significant ascites seen in the 4 quadrants of the abdomen by ultrasound. Study somewhat limited by overlapping bowel gas and soft tissue IMPRESSION: No significant pockets of ascites seen. Electronically Signed   By: Karen Kays M.D.   On: 03/06/2023 11:40        Scheduled Meds:  digoxin  0.125 mg Oral Daily   diltiazem  180 mg Oral Daily   furosemide  40 mg Intravenous BID   insulin aspart  0-20 Units Subcutaneous TID WC   insulin aspart  0-5 Units Subcutaneous QHS   lactulose  10 g Oral TID   lidocaine  1 patch Transdermal Q24H   midodrine  10 mg Oral TID WC   potassium chloride  40 mEq Oral Q4H   rifaximin  550 mg Oral BID   Continuous Infusions:   ceFAZolin (ANCEF) IV 2 g (03/08/23 0532)     LOS: 3 days    Osvaldo Shipper, MD [facility]   03/08/2023, 9:19 AM

## 2023-03-08 NOTE — Care Management Important Message (Signed)
Important Message  Patient Details  Name: Hannah Khan MRN: [id number] Date of Birth: 09-14-1956   Medicare Important Message Given:  Yes     Sherilyn Banker 03/08/2023, 4:34 PM

## 2023-03-09 ENCOUNTER — Inpatient Hospital Stay (HOSPITAL_COMMUNITY): Payer: Medicare PPO

## 2023-03-09 DIAGNOSIS — I5031 Acute diastolic (congestive) heart failure: Secondary | ICD-10-CM | POA: Diagnosis not present

## 2023-03-09 DIAGNOSIS — E876 Hypokalemia: Secondary | ICD-10-CM | POA: Diagnosis not present

## 2023-03-09 DIAGNOSIS — I4891 Unspecified atrial fibrillation: Secondary | ICD-10-CM | POA: Diagnosis not present

## 2023-03-09 DIAGNOSIS — L03311 Cellulitis of abdominal wall: Secondary | ICD-10-CM | POA: Diagnosis not present

## 2023-03-09 DIAGNOSIS — L0390 Cellulitis, unspecified: Secondary | ICD-10-CM | POA: Diagnosis not present

## 2023-03-09 DIAGNOSIS — A419 Sepsis, unspecified organism: Secondary | ICD-10-CM | POA: Diagnosis not present

## 2023-03-09 LAB — BASIC METABOLIC PANEL
Anion gap: 11 (ref 5–15)
BUN: 20 mg/dL (ref 8–23)
CO2: 26 mmol/L (ref 22–32)
Calcium: 8.4 mg/dL — ABNORMAL LOW (ref 8.9–10.3)
Chloride: 98 mmol/L (ref 98–111)
Creatinine, Ser: 1.24 mg/dL — ABNORMAL HIGH (ref 0.44–1.00)
GFR, Estimated: 48 mL/min — ABNORMAL LOW (ref >60)
Glucose, Bld: 166 mg/dL — ABNORMAL HIGH (ref 70–99)
Potassium: 2.9 mmol/L — ABNORMAL LOW (ref 3.5–5.1)
Sodium: 135 mmol/L (ref 135–145)

## 2023-03-09 LAB — GLUCOSE, CAPILLARY
Glucose-Capillary: 109 mg/dL — ABNORMAL HIGH (ref 70–99)
Glucose-Capillary: 112 mg/dL — ABNORMAL HIGH (ref 70–99)
Glucose-Capillary: 100 mg/dL — ABNORMAL HIGH (ref 70–99)
Glucose-Capillary: 86 mg/dL (ref 70–99)

## 2023-03-09 LAB — CULTURE, BLOOD (ROUTINE X 2)

## 2023-03-09 LAB — MAGNESIUM: Magnesium: 1.7 mg/dL (ref 1.7–2.4)

## 2023-03-09 MED ORDER — POTASSIUM CHLORIDE CRYS ER 20 MEQ PO TBCR
40.0000 meq | EXTENDED_RELEASE_TABLET | ORAL | Status: AC
  Administered 2023-03-09 (×3): 40 meq via ORAL
  Filled 2023-03-09 (×3): qty 2

## 2023-03-09 MED ORDER — METOPROLOL TARTRATE 25 MG PO TABS
25.0000 mg | ORAL_TABLET | Freq: Two times a day (BID) | ORAL | Status: DC
  Administered 2023-03-09 – 2023-03-12 (×7): 25 mg via ORAL
  Filled 2023-03-09 (×7): qty 1

## 2023-03-09 MED ORDER — MAGNESIUM SULFATE 2 GM/50ML IV SOLN
2.0000 g | Freq: Once | INTRAVENOUS | Status: AC
  Administered 2023-03-09: 2 g via INTRAVENOUS
  Filled 2023-03-09: qty 50

## 2023-03-09 MED ORDER — DILTIAZEM HCL 60 MG PO TABS
60.0000 mg | ORAL_TABLET | Freq: Three times a day (TID) | ORAL | Status: DC
  Administered 2023-03-09 – 2023-03-10 (×4): 60 mg via ORAL
  Filled 2023-03-09 (×4): qty 1

## 2023-03-09 NOTE — Progress Notes (Signed)
PROGRESS NOTE    Hannah Khan  ZOX:[id number] DOB: Aug 24, 1956 DOA: 03/05/2023 PCP: Darrow Bussing, MD  Chief Complaint  Patient presents with   Shortness of Breath    Brief Narrative:  Hannah Khan is a 67 y.o. female with medical history significant of multiple medical problems including morbid obesity, type 2 diabetes, history of recurrent GI bleed, Obstructive sleep apnea, essential hypertension, atrial fibrillation, liver cirrhosis secondary to NASH, pancytopenia secondary to liver disease, chronic diastolic heart failure, portal hypertension as a result of cirrhosis, who presents with husband complaining of shortness of breath.  She was admitted with abdominal cellulitis and atrial fibrillation with RVR.  Started on abx.    Assessment & Plan:   Hypotension Appears to be chronic based on previous trends. Started on midodrine considering her liver cirrhosis and chronic hypotension.  She was also given albumin. Blood pressure has improved.  She remains asymptomatic.  Abdominal wall cellulitis with sepsis  Sepsis was ruled in with fever, tachycardia Blood cultures -> growing staph epidermidis, suspect this is a contaminant  Patient started on cefazolin. She apparently has a previous history of same.   No open wounds noted.  Cellulitis appears to be improving.  Continue cefazolin for additional day.  Anticipate transition to oral antibiotics in the next 24 to 48 hours.    Shortness of Breath Patient did have dyspnea at presentation.  Could have been due to atrial fibrillation.  Likely multifactorial including A-fib, morbid obesity, cirrhosis with some fluid overload.  Remains on diuretics.    Atrial Fibrillation with RVR Heart rate remains poorly controlled.  Was transitioned to long-acting Cardizem yesterday.  However had RVR overnight.  Continues to have tachycardia this morning though she is asymptomatic currently.  She did have chest pain and palpitations  overnight. Currently on short acting diltiazem as well as digoxin. Not a candidate for anticoagulation due to thrombocytopenia.    NASH Cirrhosis/Severe Thrombocytopenia  Continue xifaxan, lactulose Apparently plan for TIPs which was aborted due to elevated right heart pressures 12/2022 Has a history of grade 1 esophageal varices (04/2022) -> beta blocker currently on hold with hypotension.  Acute on chronic diastolic CHF  Echocardiogram showed normal left ventricular systolic function.  Diastolic function could not be ascertained.  Remains on IV furosemide.  Monitor ins and outs and daily weights.   Jardiance discontinued due to recurrent cellulitis.  AKI/hypokalemia In the setting of acute infection, sepsis and diuretics.  Renal function has been stable.  Labs are pending from today.  Monitor urine output. Potassium was supplemented yesterday.  Labs are pending from today.    T2DM Currently on SSI On jardiance outpatient - this should be discontinued Uses GLP 1 agonist outpatient    OSA CPAP  Normocytic anemia Stable hemoglobin.  No evidence of overt bleeding.  Chronic Pain  DVT prophylaxis: severe thrombocytopenia, none Code Status: full Family Communication: none Disposition: Hopefully return home when improved   Consultants:  cardiology  Procedures:  none  Antimicrobials:  Anti-infectives (From admission, onward)    Start     Dose/Rate Route Frequency Ordered Stop   03/06/23 1400  ceFAZolin (ANCEF) IVPB 2g/100 mL premix        2 g 200 mL/hr over 30 Minutes Intravenous Every 8 hours 03/06/23 1052 03/13/23 1359   03/06/23 1200  rifaximin (XIFAXAN) tablet 550 mg        550 mg Oral 2 times daily 03/06/23 1054     03/05/23 2100  cefTRIAXone (ROCEPHIN) 2 g  in sodium chloride 0.9 % 100 mL IVPB  Status:  Discontinued        2 g 200 mL/hr over 30 Minutes Intravenous Every 24 hours 03/05/23 2051 03/05/23 2053   03/05/23 1630  cefTRIAXone (ROCEPHIN) 2 g in sodium chloride  0.9 % 100 mL IVPB  Status:  Discontinued        2 g 200 mL/hr over 30 Minutes Intravenous Every 24 hours 03/05/23 1629 03/06/23 1052       Subjective: Overnight events noted.  Feels better this morning.  Denies any chest pain currently.  No nausea or vomiting.  Objective: Vitals:   03/09/23 0151 03/09/23 0526 03/09/23 0601 03/09/23 0733  BP: (!) 111/47 (!) 103/37  (!) 108/47  Pulse: 63 (!) 103  100  Resp:  18  19  Temp:  98.3 F (36.8 C)  97.7 F (36.5 C)  TempSrc:  Oral  Oral  SpO2: 99% 100%    Weight:   (!) 152.6 kg   Height:        Intake/Output Summary (Last 24 hours) at 03/09/2023 0905 Last data filed at 03/09/2023 0602 Gross per 24 hour  Intake 1193 ml  Output 3800 ml  Net -2607 ml    Filed Weights   03/07/23 0531 03/08/23 0408 03/09/23 0601  Weight: (!) 156.7 kg (!) 156.7 kg (!) 152.6 kg    Examination:  General appearance: Awake alert.  In no distress Resp: Clear to auscultation bilaterally.  Normal effort Cardio: S1-S2 is tachycardic, irregularly irregular GI: Abdomen is soft.  Improvement in erythema noted. 1+ edema bilateral lower extremity No obvious focal neurological deficits.   Data Reviewed: I have personally reviewed following labs and reports of imaging studies  CBC: Recent Labs  Lab 03/05/23 1549 03/06/23 0104 03/07/23 0059 03/08/23 0139  WBC 7.4 7.3 2.4* 2.5*  NEUTROABS 6.7  --  1.7  --   HGB 11.7* 11.2* 9.8* 10.0*  HCT 36.4 33.5* 28.6* 29.3*  MCV 101.4* 100.9* 96.3 96.4  PLT 30* 20* 19* 25*     Basic Metabolic Panel: Recent Labs  Lab 03/05/23 1549 03/06/23 0104 03/07/23 0059 03/08/23 0139  NA 136 134* 134* 136  K 4.2 3.3* 2.9* 2.8*  CL 103 100 97* 100  CO2 23 22 24 25   GLUCOSE 149* 157* 161* 109*  BUN 18 22 28* 25*  CREATININE 1.38* 1.59* 1.68* 1.37*  CALCIUM 8.4* 8.1* 8.4* 8.3*  MG 1.8  --  1.8 1.8  PHOS  --   --  2.9 2.8     GFR: Estimated Creatinine Clearance: 61.6 mL/min (A) (by C-G formula based on SCr of  1.37 mg/dL (H)).  Liver Function Tests: Recent Labs  Lab 03/05/23 1549 03/06/23 0104 03/07/23 0059  AST 42* 35 37  ALT 21 22 22   ALKPHOS 71 67 52  BILITOT 5.0* 4.8* 4.2*  PROT 5.9* 5.4* 5.7*  ALBUMIN 2.7* 2.3* 2.9*     CBG: Recent Labs  Lab 03/08/23 0618 03/08/23 1042 03/08/23 1608 03/08/23 2106 03/09/23 0626  GLUCAP 97 115* 119* 103* 109*      Recent Results (from the past 240 hour(s))  Blood Culture (routine x 2)     Status: None (Preliminary result)   Collection Time: 03/05/23  3:49 PM   Specimen: BLOOD  Result Value Ref Range Status   Specimen Description BLOOD BLOOD RIGHT HAND  Final   Special Requests   Final    BOTTLES DRAWN AEROBIC AND ANAEROBIC Blood Culture results may not  be optimal due to an inadequate volume of blood received in culture bottles   Culture   Final    NO GROWTH 4 DAYS Performed at [facility], [address], [place], [place] [address]    Report Status PENDING  Incomplete  Blood Culture (routine x 2)     Status: Abnormal   Collection Time: 03/05/23  4:34 PM   Specimen: BLOOD  Result Value Ref Range Status   Specimen Description BLOOD SITE NOT SPECIFIED  Final   Special Requests   Final    BOTTLES DRAWN AEROBIC ONLY Blood Culture adequate volume   Culture  Setup Time   Final    GRAM POSITIVE COCCI IN CLUSTERS AEROBIC BOTTLE ONLY CRITICAL RESULT CALLED TO, READ BACK BY AND VERIFIED WITH: PHARMD J. LEDFORD 03/07/23 @ 0424 BY AB    Culture (A)  Final    STAPHYLOCOCCUS EPIDERMIDIS THE SIGNIFICANCE OF ISOLATING THIS ORGANISM FROM A SINGLE SET OF BLOOD CULTURES WHEN MULTIPLE SETS ARE DRAWN IS UNCERTAIN. PLEASE NOTIFY THE MICROBIOLOGY DEPARTMENT WITHIN ONE WEEK IF SPECIATION AND SENSITIVITIES ARE REQUIRED. Performed at [facility], [address] [place], [place] [address]    Report Status 03/08/2023 FINAL  Final  Blood Culture ID Panel (Reflexed)     Status: Abnormal   Collection Time: 03/05/23  4:34 PM  Result Value  Ref Range Status   Enterococcus faecalis NOT DETECTED NOT DETECTED Final   Enterococcus Faecium NOT DETECTED NOT DETECTED Final   Listeria monocytogenes NOT DETECTED NOT DETECTED Final   Staphylococcus species DETECTED (A) NOT DETECTED Final    Comment: CRITICAL RESULT CALLED TO, READ BACK BY AND VERIFIED WITH: PHARMD J. LEDFORD 03/07/23 @ 0424 BY AB    Staphylococcus aureus (BCID) NOT DETECTED NOT DETECTED Final   Staphylococcus epidermidis DETECTED (A) NOT DETECTED Final    Comment: Methicillin (oxacillin) resistant coagulase negative staphylococcus. Possible blood culture contaminant (unless isolated from more than one blood culture draw or clinical case suggests pathogenicity). No antibiotic treatment is indicated for blood  culture contaminants. CRITICAL RESULT CALLED TO, READ BACK BY AND VERIFIED WITH: PHARMD J. LEDFORD 03/07/23 @ 0424 BY AB    Staphylococcus lugdunensis NOT DETECTED NOT DETECTED Final   Streptococcus species NOT DETECTED NOT DETECTED Final   Streptococcus agalactiae NOT DETECTED NOT DETECTED Final   Streptococcus pneumoniae NOT DETECTED NOT DETECTED Final   Streptococcus pyogenes NOT DETECTED NOT DETECTED Final   A.calcoaceticus-baumannii NOT DETECTED NOT DETECTED Final   Bacteroides fragilis NOT DETECTED NOT DETECTED Final   Enterobacterales NOT DETECTED NOT DETECTED Final   Enterobacter cloacae complex NOT DETECTED NOT DETECTED Final   Escherichia coli NOT DETECTED NOT DETECTED Final   Klebsiella aerogenes NOT DETECTED NOT DETECTED Final   Klebsiella oxytoca NOT DETECTED NOT DETECTED Final   Klebsiella pneumoniae NOT DETECTED NOT DETECTED Final   Proteus species NOT DETECTED NOT DETECTED Final   Salmonella species NOT DETECTED NOT DETECTED Final   Serratia marcescens NOT DETECTED NOT DETECTED Final   Haemophilus influenzae NOT DETECTED NOT DETECTED Final   Neisseria meningitidis NOT DETECTED NOT DETECTED Final   Pseudomonas aeruginosa NOT DETECTED NOT DETECTED  Final   Stenotrophomonas maltophilia NOT DETECTED NOT DETECTED Final   Candida albicans NOT DETECTED NOT DETECTED Final   Candida auris NOT DETECTED NOT DETECTED Final   Candida glabrata NOT DETECTED NOT DETECTED Final   Candida krusei NOT DETECTED NOT DETECTED Final   Candida parapsilosis NOT DETECTED NOT DETECTED Final  Candida tropicalis NOT DETECTED NOT DETECTED Final   Cryptococcus neoformans/gattii NOT DETECTED NOT DETECTED Final   Methicillin resistance mecA/C DETECTED (A) NOT DETECTED Final    Comment: CRITICAL RESULT CALLED TO, READ BACK BY AND VERIFIED WITH: PHARMD J. LEDFORD 03/07/23 @ 0424 BY AB Performed at [facility], [address], [place], [place] [address]        Radiology Studies: ECHOCARDIOGRAM COMPLETE  Result Date: 03/07/2023    ECHOCARDIOGRAM REPORT   Patient Name:   Hannah Khan Date of Exam: 03/07/2023 Medical Rec #:  [id number]       Height:       67.0 in Accession #:    [id number]      Weight:       345.5 lb Date of Birth:  1956-08-12       BSA:          2.551 m Patient Age:    67 years        BP:           112/59 mmHg Patient Gender: F               HR:           83 bpm. Exam Location:  Inpatient Procedure: 2D Echo, Cardiac Doppler, Color Doppler and Intracardiac            Opacification Agent Indications:    CHF- Diastolic  History:        Patient has prior history of Echocardiogram examinations, most                 recent 11/01/2022. CHF, Arrythmias:Atrial Fibrillation,                 Signs/Symptoms:Shortness of Breath and Hypotension; Risk                 Factors:Sleep Apnea, Diabetes, Hypertension and Morbid obesity.                 Liver cirrhosis secondary to NASH, portal HTN.  Sonographer:    Milda Smart Referring Phys: [phone] A CALDWELL POWELL JR  Sonographer Comments: Patient is obese and Technically difficult study due to poor echo windows. Image acquisition challenging due to patient body habitus and Image acquisition challenging due to  respiratory motion. IMPRESSIONS  1. Left ventricular ejection fraction, by estimation, is 55 to 60%. The left ventricle has normal function. The left ventricle has no regional wall motion abnormalities. Left ventricular diastolic function could not be evaluated.  2. Right ventricular systolic function is normal. The right ventricular size is normal. There is mildly elevated pulmonary artery systolic pressure. The estimated right ventricular systolic pressure is 39.6 mmHg.  3. Left atrial size was severely dilated.  4. Right atrial size was severely dilated.  5. The mitral valve is normal in structure. No evidence of mitral valve regurgitation.  6. Tricuspid valve regurgitation is moderate.  7. The aortic valve is tricuspid. There is moderate calcification of the aortic valve. There is moderate thickening of the aortic valve. Aortic valve regurgitation is not visualized. Mild to moderate aortic valve stenosis.  8. The inferior vena cava is dilated in size with <50% respiratory variability, suggesting right atrial pressure of 15 mmHg. Comparison(s): No significant change from prior study. Prior images reviewed side by side. FINDINGS  Left Ventricle: Left ventricular ejection fraction, by estimation, is 55 to 60%. The left ventricle has normal function. The left ventricle has no regional wall motion  abnormalities. Definity contrast agent was given IV to delineate the left ventricular  endocardial borders. The left ventricular internal cavity size was normal in size. There is no left ventricular hypertrophy. Left ventricular diastolic function could not be evaluated due to atrial fibrillation. Left ventricular diastolic function could  not be evaluated. Right Ventricle: The right ventricular size is normal. No increase in right ventricular wall thickness. Right ventricular systolic function is normal. There is mildly elevated pulmonary artery systolic pressure. The tricuspid regurgitant velocity is 2.48  m/s, and with  an assumed right atrial pressure of 15 mmHg, the estimated right ventricular systolic pressure is 39.6 mmHg. Left Atrium: Left atrial size was severely dilated. Right Atrium: Right atrial size was severely dilated. Pericardium: There is no evidence of pericardial effusion. Mitral Valve: The mitral valve is normal in structure. No evidence of mitral valve regurgitation. Tricuspid Valve: The tricuspid valve is grossly normal. Tricuspid valve regurgitation is moderate. Aortic Valve: The aortic valve is tricuspid. There is moderate calcification of the aortic valve. There is moderate thickening of the aortic valve. Aortic valve regurgitation is not visualized. Mild to moderate aortic stenosis is present. Aortic valve mean gradient measures 15.0 mmHg. Aortic valve peak gradient measures 21.4 mmHg. Pulmonic Valve: The pulmonic valve was not well visualized. Pulmonic valve regurgitation is not visualized. No evidence of pulmonic stenosis. Aorta: The aortic root and ascending aorta are structurally normal, with no evidence of dilitation. Venous: The inferior vena cava is dilated in size with less than 50% respiratory variability, suggesting right atrial pressure of 15 mmHg. IAS/Shunts: No atrial level shunt detected by color flow Doppler.  LEFT VENTRICLE PLAX 2D LVIDd:         5.00 cm   Diastology LVIDs:         3.70 cm   LV e' medial:    9.90 cm/s LV PW:         0.90 cm   LV E/e' medial:  14.6 LV IVS:        0.90 cm   LV e' lateral:   9.36 cm/s LVOT diam:     2.20 cm   LV E/e' lateral: 15.5 LVOT Area:     3.80 cm  RIGHT VENTRICLE RV S prime:     11.10 cm/s TAPSE (M-mode): 2.0 cm LEFT ATRIUM              Index        RIGHT ATRIUM           Index LA diam:        5.70 cm  2.23 cm/m   RA Area:     27.70 cm LA Vol (A2C):   108.0 ml 42.33 ml/m  RA Volume:   85.40 ml  33.47 ml/m LA Vol (A4C):   100.0 ml 39.20 ml/m LA Biplane Vol: 105.0 ml 41.16 ml/m  AORTIC VALVE AV Vmax:      231.50 cm/s AV Vmean:     185.000 cm/s AV VTI:        0.616 m AV Peak Grad: 21.4 mmHg AV Mean Grad: 15.0 mmHg  AORTA Ao Root diam: 2.80 cm Ao Asc diam:  3.60 cm MITRAL VALVE                TRICUSPID VALVE MV Area (PHT): 3.95 cm     TR Peak grad:   24.6 mmHg MV Decel Time: 192 msec     TR Mean grad:   14.0 mmHg MV E velocity: 145.00 cm/s  TR Vmax:        248.00 cm/s                             TR Vmean:       177.0 cm/s                              SHUNTS                             Systemic Diam: 2.20 cm Mihai Croitoru MD Electronically signed by Thurmon Fair MD Signature Date/Time: 03/07/2023/2:58:19 PM    Final         Scheduled Meds:  digoxin  0.125 mg Oral Daily   diltiazem  60 mg Oral Q8H   furosemide  40 mg Intravenous BID   insulin aspart  0-20 Units Subcutaneous TID WC   insulin aspart  0-5 Units Subcutaneous QHS   lactulose  10 g Oral TID   lidocaine  1 patch Transdermal Q24H   midodrine  10 mg Oral TID WC   rifaximin  550 mg Oral BID   Continuous Infusions:   ceFAZolin (ANCEF) IV 2 g (03/09/23 0531)     LOS: 4 days    Osvaldo Shipper, MD [facility]   03/09/2023, 9:05 AM

## 2023-03-09 NOTE — Progress Notes (Signed)
Rounding Note    Patient Name: Hannah Khan Date of Encounter: 03/09/2023  [facility] Cardiologist: Chilton Si, MD   Subjective   No CP or dyspnea this AM  Inpatient Medications    Scheduled Meds:  digoxin  0.125 mg Oral Daily   diltiazem  60 mg Oral Q8H   furosemide  40 mg Intravenous BID   insulin aspart  0-20 Units Subcutaneous TID WC   insulin aspart  0-5 Units Subcutaneous QHS   lactulose  10 g Oral TID   lidocaine  1 patch Transdermal Q24H   midodrine  10 mg Oral TID WC   rifaximin  550 mg Oral BID   Continuous Infusions:   ceFAZolin (ANCEF) IV 2 g (03/09/23 0531)   PRN Meds: acetaminophen, albuterol, morphine injection, ondansetron **OR** ondansetron (ZOFRAN) IV   Vital Signs    Vitals:   03/09/23 0151 03/09/23 0526 03/09/23 0601 03/09/23 0733  BP: (!) 111/47 (!) 103/37  (!) 108/47  Pulse: 63 (!) 103  100  Resp:  18  19  Temp:  98.3 F (36.8 C)  97.7 F (36.5 C)  TempSrc:  Oral  Oral  SpO2: 99% 100%    Weight:   (!) 152.6 kg   Height:        Intake/Output Summary (Last 24 hours) at 03/09/2023 0922 Last data filed at 03/09/2023 0602 Gross per 24 hour  Intake 1193 ml  Output 3800 ml  Net -2607 ml       03/09/2023    6:01 AM 03/08/2023    4:08 AM 03/07/2023    5:31 AM  Last 3 Weights  Weight (lbs) 336 lb 6.4 oz 345 lb 7.4 oz 345 lb 8 oz  Weight (kg) 152.59 kg 156.7 kg 156.718 kg      Telemetry    Atrial fibrillation rate elevated- Personally Reviewed  Physical Exam   GEN: WD obese, NAD Neck: supple Cardiac: irregular, 2/6 systolic murmur Respiratory: CTA; no rhonchi GI: Soft, NT/ND, no masses, cellulitis noted (improving) MS: trace edema Neuro: grossly intact Psych: Normal affect   Labs    Chemistry Recent Labs  Lab 03/05/23 1549 03/06/23 0104 03/07/23 0059 03/08/23 0139  NA 136 134* 134* 136  K 4.2 3.3* 2.9* 2.8*  CL 103 100 97* 100  CO2 23 22 24 25   GLUCOSE 149* 157* 161* 109*  BUN 18 22 28* 25*   CREATININE 1.38* 1.59* 1.68* 1.37*  CALCIUM 8.4* 8.1* 8.4* 8.3*  MG 1.8  --  1.8 1.8  PROT 5.9* 5.4* 5.7*  --   ALBUMIN 2.7* 2.3* 2.9*  --   AST 42* 35 37  --   ALT 21 22 22   --   ALKPHOS 71 67 52  --   BILITOT 5.0* 4.8* 4.2*  --   GFRNONAA 42* 35* 33* 42*  ANIONGAP 10 12 13 11      Hematology Recent Labs  Lab 03/06/23 0104 03/07/23 0059 03/08/23 0139  WBC 7.3 2.4* 2.5*  RBC 3.32* 2.97* 3.04*  HGB 11.2* 9.8* 10.0*  HCT 33.5* 28.6* 29.3*  MCV 100.9* 96.3 96.4  MCH 33.7 33.0 32.9  MCHC 33.4 34.3 34.1  RDW 15.7* 15.4 15.0  PLT 20* 19* 25*     BNP Recent Labs  Lab 03/05/23 1549  BNP 362.6*      Radiology    ECHOCARDIOGRAM COMPLETE  Result Date: 03/07/2023    ECHOCARDIOGRAM REPORT   Patient Name:   Hannah Khan Date of Exam:  03/07/2023 Medical Rec #:  [id number]       Height:       67.0 in Accession #:    [id number]      Weight:       345.5 lb Date of Birth:  10/23/1955       BSA:          2.551 m Patient Age:    67 years        BP:           112/59 mmHg Patient Gender: F               HR:           83 bpm. Exam Location:  Inpatient Procedure: 2D Echo, Cardiac Doppler, Color Doppler and Intracardiac            Opacification Agent Indications:    CHF- Diastolic  History:        Patient has prior history of Echocardiogram examinations, most                 recent 11/01/2022. CHF, Arrythmias:Atrial Fibrillation,                 Signs/Symptoms:Shortness of Breath and Hypotension; Risk                 Factors:Sleep Apnea, Diabetes, Hypertension and Morbid obesity.                 Liver cirrhosis secondary to NASH, portal HTN.  Sonographer:    Milda Smart Referring Phys: [phone] A CALDWELL POWELL JR  Sonographer Comments: Patient is obese and Technically difficult study due to poor echo windows. Image acquisition challenging due to patient body habitus and Image acquisition challenging due to respiratory motion. IMPRESSIONS  1. Left ventricular ejection fraction, by estimation, is  55 to 60%. The left ventricle has normal function. The left ventricle has no regional wall motion abnormalities. Left ventricular diastolic function could not be evaluated.  2. Right ventricular systolic function is normal. The right ventricular size is normal. There is mildly elevated pulmonary artery systolic pressure. The estimated right ventricular systolic pressure is 39.6 mmHg.  3. Left atrial size was severely dilated.  4. Right atrial size was severely dilated.  5. The mitral valve is normal in structure. No evidence of mitral valve regurgitation.  6. Tricuspid valve regurgitation is moderate.  7. The aortic valve is tricuspid. There is moderate calcification of the aortic valve. There is moderate thickening of the aortic valve. Aortic valve regurgitation is not visualized. Mild to moderate aortic valve stenosis.  8. The inferior vena cava is dilated in size with <50% respiratory variability, suggesting right atrial pressure of 15 mmHg. Comparison(s): No significant change from prior study. Prior images reviewed side by side. FINDINGS  Left Ventricle: Left ventricular ejection fraction, by estimation, is 55 to 60%. The left ventricle has normal function. The left ventricle has no regional wall motion abnormalities. Definity contrast agent was given IV to delineate the left ventricular  endocardial borders. The left ventricular internal cavity size was normal in size. There is no left ventricular hypertrophy. Left ventricular diastolic function could not be evaluated due to atrial fibrillation. Left ventricular diastolic function could  not be evaluated. Right Ventricle: The right ventricular size is normal. No increase in right ventricular wall thickness. Right ventricular systolic function is normal. There is mildly elevated pulmonary artery systolic pressure. The tricuspid regurgitant velocity is 2.48  m/s, and with an assumed  right atrial pressure of 15 mmHg, the estimated right ventricular systolic  pressure is 39.6 mmHg. Left Atrium: Left atrial size was severely dilated. Right Atrium: Right atrial size was severely dilated. Pericardium: There is no evidence of pericardial effusion. Mitral Valve: The mitral valve is normal in structure. No evidence of mitral valve regurgitation. Tricuspid Valve: The tricuspid valve is grossly normal. Tricuspid valve regurgitation is moderate. Aortic Valve: The aortic valve is tricuspid. There is moderate calcification of the aortic valve. There is moderate thickening of the aortic valve. Aortic valve regurgitation is not visualized. Mild to moderate aortic stenosis is present. Aortic valve mean gradient measures 15.0 mmHg. Aortic valve peak gradient measures 21.4 mmHg. Pulmonic Valve: The pulmonic valve was not well visualized. Pulmonic valve regurgitation is not visualized. No evidence of pulmonic stenosis. Aorta: The aortic root and ascending aorta are structurally normal, with no evidence of dilitation. Venous: The inferior vena cava is dilated in size with less than 50% respiratory variability, suggesting right atrial pressure of 15 mmHg. IAS/Shunts: No atrial level shunt detected by color flow Doppler.  LEFT VENTRICLE PLAX 2D LVIDd:         5.00 cm   Diastology LVIDs:         3.70 cm   LV e' medial:    9.90 cm/s LV PW:         0.90 cm   LV E/e' medial:  14.6 LV IVS:        0.90 cm   LV e' lateral:   9.36 cm/s LVOT diam:     2.20 cm   LV E/e' lateral: 15.5 LVOT Area:     3.80 cm  RIGHT VENTRICLE RV S prime:     11.10 cm/s TAPSE (M-mode): 2.0 cm LEFT ATRIUM              Index        RIGHT ATRIUM           Index LA diam:        5.70 cm  2.23 cm/m   RA Area:     27.70 cm LA Vol (A2C):   108.0 ml 42.33 ml/m  RA Volume:   85.40 ml  33.47 ml/m LA Vol (A4C):   100.0 ml 39.20 ml/m LA Biplane Vol: 105.0 ml 41.16 ml/m  AORTIC VALVE AV Vmax:      231.50 cm/s AV Vmean:     185.000 cm/s AV VTI:       0.616 m AV Peak Grad: 21.4 mmHg AV Mean Grad: 15.0 mmHg  AORTA Ao Root diam:  2.80 cm Ao Asc diam:  3.60 cm MITRAL VALVE                TRICUSPID VALVE MV Area (PHT): 3.95 cm     TR Peak grad:   24.6 mmHg MV Decel Time: 192 msec     TR Mean grad:   14.0 mmHg MV E velocity: 145.00 cm/s  TR Vmax:        248.00 cm/s                             TR Vmean:       177.0 cm/s                              SHUNTS  Systemic Diam: 2.20 cm Mihai Croitoru MD Electronically signed by Thurmon Fair MD Signature Date/Time: 03/07/2023/2:58:19 PM    Final       Patient Profile     67 y.o. female with past medical history of chronic diastolic congestive heart failure, permanent atrial fibrillation, sleep apnea, morbid obesity, cirrhosis complicated by portal hypertension, hypertension, diabetes mellitus being evaluated for atrial fibrillation and acute on chronic diastolic congestive heart failure. Echocardiogram this admission shows normal LV function, severe biatrial enlargement, moderate tricuspid regurgitation and mild to moderate aortic stenosis with mean gradient 15 mmHg.  Assessment & Plan    1 permanent atrial fibrillation-heart rate is again elevated.  Will continue Cardizem at present dose as well as digoxin.  Patient was also on Toprol prior to admission.  Will begin metoprolol 25 mg twice daily as tolerated by blood pressure.  Increase as needed. No anticoagulation given thrombocytopenia.    2 acute on chronic diastolic congestive heart failure-she continues to diurese well.  Will continue Lasix 40 mg IV twice daily and transition to [facility] prior to discharge.  Given morbid obesity, pannus and active cellulitis she is not a candidate for SGLT2 inhibitor.    3 mild to moderate aortic stenosis-will need follow-up echoes in the future.  4 cellulitis-antibiotics per primary care  5 cirrhosis/thrombocytopenia/diabetes mellitus-Per primary care.  6 hypokalemia-supplement  For questions or updates, please contact [facility] Please consult  [email or web address] for contact info under        Signed, Olga Millers, MD  03/09/2023, 9:22 AM

## 2023-03-10 DIAGNOSIS — L0390 Cellulitis, unspecified: Secondary | ICD-10-CM | POA: Diagnosis not present

## 2023-03-10 DIAGNOSIS — I5031 Acute diastolic (congestive) heart failure: Secondary | ICD-10-CM | POA: Diagnosis not present

## 2023-03-10 DIAGNOSIS — I4891 Unspecified atrial fibrillation: Secondary | ICD-10-CM | POA: Diagnosis not present

## 2023-03-10 DIAGNOSIS — A419 Sepsis, unspecified organism: Secondary | ICD-10-CM | POA: Diagnosis not present

## 2023-03-10 LAB — CBC
HCT: 31.8 % — ABNORMAL LOW (ref 36.0–46.0)
Hemoglobin: 11 g/dL — ABNORMAL LOW (ref 12.0–15.0)
MCH: 33.7 pg (ref 26.0–34.0)
MCHC: 34.6 g/dL (ref 30.0–36.0)
MCV: 97.5 fL (ref 80.0–100.0)
Platelets: 39 10*3/uL — ABNORMAL LOW (ref 150–400)
RBC: 3.26 MIL/uL — ABNORMAL LOW (ref 3.87–5.11)
RDW: 15.1 % (ref 11.5–15.5)
WBC: 3.2 10*3/uL — ABNORMAL LOW (ref 4.0–10.5)
nRBC: 0 % (ref 0.0–0.2)

## 2023-03-10 LAB — BASIC METABOLIC PANEL
Anion gap: 10 (ref 5–15)
BUN: 21 mg/dL (ref 8–23)
CO2: 27 mmol/L (ref 22–32)
Calcium: 8.5 mg/dL — ABNORMAL LOW (ref 8.9–10.3)
Chloride: 100 mmol/L (ref 98–111)
Creatinine, Ser: 1.21 mg/dL — ABNORMAL HIGH (ref 0.44–1.00)
GFR, Estimated: 49 mL/min — ABNORMAL LOW (ref >60)
Glucose, Bld: 129 mg/dL — ABNORMAL HIGH (ref 70–99)
Potassium: 3.6 mmol/L (ref 3.5–5.1)
Sodium: 137 mmol/L (ref 135–145)

## 2023-03-10 LAB — GLUCOSE, CAPILLARY
Glucose-Capillary: 96 mg/dL (ref 70–99)
Glucose-Capillary: 87 mg/dL (ref 70–99)
Glucose-Capillary: 97 mg/dL (ref 70–99)
Glucose-Capillary: 112 mg/dL — ABNORMAL HIGH (ref 70–99)

## 2023-03-10 LAB — CULTURE, BLOOD (ROUTINE X 2)

## 2023-03-10 MED ORDER — CEPHALEXIN 250 MG PO CAPS
500.0000 mg | ORAL_CAPSULE | Freq: Three times a day (TID) | ORAL | Status: DC
  Administered 2023-03-10 – 2023-03-12 (×7): 500 mg via ORAL
  Filled 2023-03-10 (×7): qty 2

## 2023-03-10 MED ORDER — POTASSIUM CHLORIDE CRYS ER 20 MEQ PO TBCR
40.0000 meq | EXTENDED_RELEASE_TABLET | Freq: Two times a day (BID) | ORAL | Status: AC
  Administered 2023-03-10 (×2): 40 meq via ORAL
  Filled 2023-03-10 (×2): qty 2

## 2023-03-10 MED ORDER — DILTIAZEM HCL ER COATED BEADS 180 MG PO CP24
180.0000 mg | ORAL_CAPSULE | Freq: Every day | ORAL | Status: DC
  Administered 2023-03-10 – 2023-03-12 (×3): 180 mg via ORAL
  Filled 2023-03-10 (×3): qty 1

## 2023-03-10 NOTE — Progress Notes (Signed)
Rounding Note    Patient Name: Hannah Khan Date of Encounter: 03/10/2023  [facility] Cardiologist: Chilton Si, MD   Subjective   Denies CP or dyspnea  Inpatient Medications    Scheduled Meds:  digoxin  0.125 mg Oral Daily   diltiazem  60 mg Oral Q8H   furosemide  40 mg Intravenous BID   insulin aspart  0-20 Units Subcutaneous TID WC   insulin aspart  0-5 Units Subcutaneous QHS   lactulose  10 g Oral TID   lidocaine  1 patch Transdermal Q24H   metoprolol tartrate  25 mg Oral BID   midodrine  10 mg Oral TID WC   rifaximin  550 mg Oral BID   Continuous Infusions:   ceFAZolin (ANCEF) IV 2 g (03/10/23 0648)   PRN Meds: acetaminophen, albuterol, morphine injection, ondansetron **OR** ondansetron (ZOFRAN) IV   Vital Signs    Vitals:   03/09/23 2345 03/10/23 0351 03/10/23 0500 03/10/23 0755  BP: (!) 112/56 (!) 108/58  (!) 117/57  Pulse: 88 90  86  Resp: 18 20  20   Temp: 98 F (36.7 C) 97.7 F (36.5 C)  97.7 F (36.5 C)  TempSrc: Oral Oral  Oral  SpO2: 99% 98%  99%  Weight:   (!) 151.6 kg   Height:        Intake/Output Summary (Last 24 hours) at 03/10/2023 0945 Last data filed at 03/10/2023 0930 Gross per 24 hour  Intake 797 ml  Output 2725 ml  Net -1928 ml       03/10/2023    5:00 AM 03/09/2023    6:01 AM 03/08/2023    4:08 AM  Last 3 Weights  Weight (lbs) 334 lb 3.2 oz 336 lb 6.4 oz 345 lb 7.4 oz  Weight (kg) 151.592 kg 152.59 kg 156.7 kg      Telemetry    Atrial fibrillation rate elevated- Personally Reviewed  Physical Exam   GEN: WD NAD Neck: supple, JVP difficult to assess Cardiac: irregular, 2/6 systolic murmur, no rub Respiratory: CTA; no wheeze GI: Soft, NT/ND, no masses MS: trace edema Neuro: no focal findings Psych: Normal affect   Labs    Chemistry Recent Labs  Lab 03/05/23 1549 03/06/23 0104 03/07/23 0059 03/08/23 0139 03/09/23 0839 03/10/23 0115  NA 136 134* 134* 136 135 137  K 4.2 3.3* 2.9* 2.8*  2.9* 3.6  CL 103 100 97* 100 98 100  CO2 23 22 24 25 26 27   GLUCOSE 149* 157* 161* 109* 166* 129*  BUN 18 22 28* 25* 20 21  CREATININE 1.38* 1.59* 1.68* 1.37* 1.24* 1.21*  CALCIUM 8.4* 8.1* 8.4* 8.3* 8.4* 8.5*  MG 1.8  --  1.8 1.8 1.7  --   PROT 5.9* 5.4* 5.7*  --   --   --   ALBUMIN 2.7* 2.3* 2.9*  --   --   --   AST 42* 35 37  --   --   --   ALT 21 22 22   --   --   --   ALKPHOS 71 67 52  --   --   --   BILITOT 5.0* 4.8* 4.2*  --   --   --   GFRNONAA 42* 35* 33* 42* 48* 49*  ANIONGAP 10 12 13 11 11 10      Hematology Recent Labs  Lab 03/07/23 0059 03/08/23 0139 03/10/23 0115  WBC 2.4* 2.5* 3.2*  RBC 2.97* 3.04* 3.26*  HGB 9.8* 10.0* 11.0*  HCT 28.6* 29.3* 31.8*  MCV 96.3 96.4 97.5  MCH 33.0 32.9 33.7  MCHC 34.3 34.1 34.6  RDW 15.4 15.0 15.1  PLT 19* 25* 39*     BNP Recent Labs  Lab 03/05/23 1549  BNP 362.6*      Radiology    DG CHEST PORT 1 VIEW  Result Date: 03/09/2023 CLINICAL DATA:  Dyspnea EXAM: PORTABLE CHEST 1 VIEW COMPARISON:  Chest x-ray dated March 05, 2019 FINDINGS: Cardiac and mediastinal contours are unchanged. Newly visible right basilar opacity. No evidence of pneumothorax. IMPRESSION: Newly visible right basilar opacity which is likely due to a combination of atelectasis and small pleural effusion. Not well visualized on prior exam, likely due to overlying soft tissues. Electronically Signed   By: Allegra Lai M.D.   On: 03/09/2023 12:42      Patient Profile     67 y.o. female with past medical history of chronic diastolic congestive heart failure, permanent atrial fibrillation, sleep apnea, morbid obesity, cirrhosis complicated by portal hypertension, hypertension, diabetes mellitus being evaluated for atrial fibrillation and acute on chronic diastolic congestive heart failure. Echocardiogram this admission shows normal LV function, severe biatrial enlargement, moderate tricuspid regurgitation and mild to moderate aortic stenosis with mean  gradient 15 mmHg.  Assessment & Plan    1 permanent atrial fibrillation-heart rate is now controlled.  Change Cardizem to 180 mg daily.  Continue low-dose metoprolol and digoxin.  Patient is not on anticoagulation given baseline thrombocytopenia.   2 acute on chronic diastolic congestive heart failure-volume status is much improved.  Will continue Lasix today and transition to Demadex 20 mg twice daily tomorrow if she is stable.  Given morbid obesity, pannus and active cellulitis I do not think she is a good candidate for SGLT2 inhibitor.    3 mild to moderate aortic stenosis-will need follow-up echoes in the future.  4 cellulitis-antibiotics per primary care  5 cirrhosis/thrombocytopenia/diabetes mellitus-Per primary care.  6 hypokalemia-supplement  For questions or updates, please contact [facility] Please consult [email or web address] for contact info under        Signed, Olga Millers, MD  03/10/2023, 9:45 AM

## 2023-03-10 NOTE — Plan of Care (Signed)
  Problem: Nutrition: Goal: Adequate nutrition will be maintained Outcome: Completed/Met   Problem: Coping: Goal: Level of anxiety will decrease Outcome: Completed/Met   Problem: Elimination: Goal: Will not experience complications related to urinary retention Outcome: Completed/Met   Problem: Pain Managment: Goal: General experience of comfort will improve Outcome: Completed/Met   

## 2023-03-10 NOTE — Progress Notes (Signed)
PROGRESS NOTE    Hannah Khan  DGU:[id number] DOB: [birth year] DOA: 03/05/2023 PCP: Darrow Bussing, MD  Chief Complaint  Patient presents with   Shortness of Breath    Brief Narrative:  Hannah Khan is a 67 y.o. female with medical history significant of multiple medical problems including morbid obesity, type 2 diabetes, history of recurrent GI bleed, Obstructive sleep apnea, essential hypertension, atrial fibrillation, liver cirrhosis secondary to NASH, pancytopenia secondary to liver disease, chronic diastolic heart failure, portal hypertension as a result of cirrhosis, who presents with husband complaining of shortness of breath.  She was admitted with abdominal cellulitis and atrial fibrillation with RVR.  Started on abx.    Assessment & Plan:   Hypotension Appears to be chronic based on previous trends. Started on midodrine considering her liver cirrhosis and chronic hypotension.  She was also given albumin. Blood pressure has improved.  She remains asymptomatic.  Abdominal wall cellulitis with sepsis  Sepsis was ruled in with fever, tachycardia Blood cultures -> growing staph epidermidis, suspect this is a contaminant  Patient started on cefazolin. She apparently has a previous history of same.   No open wounds noted.  Cellulitis appears to be improving.  Will change cefazolin to cephalexin today.    Dyspnea Patient did have dyspnea at presentation.  Likely multifactorial including A-fib, morbid obesity, cirrhosis with some fluid overload.  Seems to be improving.  Atrial Fibrillation with RVR Cardiology continues to follow.  Patient's heart rate seems to be better controlled on Cardizem and metoprolol.  Blood pressure seems to be holding steady. Patient also on digoxin. Not a candidate for anticoagulation due to thrombocytopenia.    NASH Cirrhosis/Severe Thrombocytopenia  Continue xifaxan, lactulose Plan for TIPs was aborted due to elevated right heart pressures  12/2022 Has a history of grade 1 esophageal varices (04/2022) -> beta blocker currently on hold with hypotension.  Acute on chronic diastolic CHF  Echocardiogram showed normal left ventricular systolic function.  Diastolic function could not be ascertained.   Remains on IV furosemide.  Monitor ins and outs and daily weights.   Jardiance discontinued due to recurrent cellulitis.  AKI/hypokalemia In the setting of acute infection, sepsis and diuretics.   Renal function has improved and stable.  Monitor urine output.  Avoid nephrotoxic agents. Potassium level has improved as well.  Continue to supplement.    T2DM Currently on SSI On jardiance outpatient - this should be discontinued at DC Uses GLP 1 agonist outpatient    OSA CPAP  Normocytic anemia Stable hemoglobin.  No evidence of overt bleeding.  Chronic Pain  DVT prophylaxis: severe thrombocytopenia, none Code Status: full Family Communication: none Disposition: Hopefully return home when improved   Consultants:  cardiology  Procedures:  none  Antimicrobials:  Anti-infectives (From admission, onward)    Start     Dose/Rate Route Frequency Ordered Stop   03/06/23 1400  ceFAZolin (ANCEF) IVPB 2g/100 mL premix        2 g 200 mL/hr over 30 Minutes Intravenous Every 8 hours 03/06/23 1052 03/13/23 1359   03/06/23 1200  rifaximin (XIFAXAN) tablet 550 mg        550 mg Oral 2 times daily 03/06/23 1054     03/05/23 2100  cefTRIAXone (ROCEPHIN) 2 g in sodium chloride 0.9 % 100 mL IVPB  Status:  Discontinued        2 g 200 mL/hr over 30 Minutes Intravenous Every 24 hours 03/05/23 2051 03/05/23 2053   03/05/23 1630  cefTRIAXone (  ROCEPHIN) 2 g in sodium chloride 0.9 % 100 mL IVPB  Status:  Discontinued        2 g 200 mL/hr over 30 Minutes Intravenous Every 24 hours 03/05/23 1629 03/06/23 1052       Subjective: Feels much better this morning.  Denies any chest pain or shortness of breath overnight.  No nausea or  vomiting.  Objective: Vitals:   03/09/23 2345 03/10/23 0351 03/10/23 0500 03/10/23 0755  BP: (!) 112/56 (!) 108/58  (!) 117/57  Pulse: 88 90  86  Resp: 18 20  20   Temp: 98 F (36.7 C) 97.7 F (36.5 C)  97.7 F (36.5 C)  TempSrc: Oral Oral  Oral  SpO2: 99% 98%  99%  Weight:   (!) 151.6 kg   Height:        Intake/Output Summary (Last 24 hours) at 03/10/2023 1002 Last data filed at 03/10/2023 0930 Gross per 24 hour  Intake 797 ml  Output 2725 ml  Net -1928 ml    Filed Weights   03/08/23 0408 03/09/23 0601 03/10/23 0500  Weight: (!) 156.7 kg (!) 152.6 kg (!) 151.6 kg    Examination:  General appearance: Awake alert.  In no distress Resp: Clear to auscultation bilaterally.  Normal effort Cardio: S1-S2 is normal regular.  No S3-S4.  No rubs murmurs or bruit GI: Abdomen is soft.  Proved erythema over abdominal wall.  Abdomen remains nontender. Lower extremity edema is improving. No obvious focal neurological deficits.   Data Reviewed: I have personally reviewed following labs and reports of imaging studies  CBC: Recent Labs  Lab 03/05/23 1549 03/06/23 0104 03/07/23 0059 03/08/23 0139 03/10/23 0115  WBC 7.4 7.3 2.4* 2.5* 3.2*  NEUTROABS 6.7  --  1.7  --   --   HGB 11.7* 11.2* 9.8* 10.0* 11.0*  HCT 36.4 33.5* 28.6* 29.3* 31.8*  MCV 101.4* 100.9* 96.3 96.4 97.5  PLT 30* 20* 19* 25* 39*     Basic Metabolic Panel: Recent Labs  Lab 03/05/23 1549 03/06/23 0104 03/07/23 0059 03/08/23 0139 03/09/23 0839 03/10/23 0115  NA 136 134* 134* 136 135 137  K 4.2 3.3* 2.9* 2.8* 2.9* 3.6  CL 103 100 97* 100 98 100  CO2 23 22 24 25 26 27   GLUCOSE 149* 157* 161* 109* 166* 129*  BUN 18 22 28* 25* 20 21  CREATININE 1.38* 1.59* 1.68* 1.37* 1.24* 1.21*  CALCIUM 8.4* 8.1* 8.4* 8.3* 8.4* 8.5*  MG 1.8  --  1.8 1.8 1.7  --   PHOS  --   --  2.9 2.8  --   --      GFR: Estimated Creatinine Clearance: 69.5 mL/min (A) (by C-G formula based on SCr of 1.21 mg/dL (H)).  Liver  Function Tests: Recent Labs  Lab 03/05/23 1549 03/06/23 0104 03/07/23 0059  AST 42* 35 37  ALT 21 22 22   ALKPHOS 71 67 52  BILITOT 5.0* 4.8* 4.2*  PROT 5.9* 5.4* 5.7*  ALBUMIN 2.7* 2.3* 2.9*     CBG: Recent Labs  Lab 03/09/23 0626 03/09/23 1141 03/09/23 1640 03/09/23 2050 03/10/23 0545  GLUCAP 109* 112* 100* 86 96      Recent Results (from the past 240 hour(s))  Blood Culture (routine x 2)     Status: None   Collection Time: 03/05/23  3:49 PM   Specimen: BLOOD  Result Value Ref Range Status   Specimen Description BLOOD BLOOD RIGHT HAND  Final   Special Requests  Final    BOTTLES DRAWN AEROBIC AND ANAEROBIC Blood Culture results may not be optimal due to an inadequate volume of blood received in culture bottles   Culture   Final    NO GROWTH 5 DAYS Performed at [facility], [address] [place], [place] [address]    Report Status 03/10/2023 FINAL  Final  Blood Culture (routine x 2)     Status: Abnormal   Collection Time: 03/05/23  4:34 PM   Specimen: BLOOD  Result Value Ref Range Status   Specimen Description BLOOD SITE NOT SPECIFIED  Final   Special Requests   Final    BOTTLES DRAWN AEROBIC ONLY Blood Culture adequate volume   Culture  Setup Time   Final    GRAM POSITIVE COCCI IN CLUSTERS AEROBIC BOTTLE ONLY CRITICAL RESULT CALLED TO, READ BACK BY AND VERIFIED WITH: PHARMD J. LEDFORD 03/07/23 @ 0424 BY AB    Culture (A)  Final    STAPHYLOCOCCUS EPIDERMIDIS THE SIGNIFICANCE OF ISOLATING THIS ORGANISM FROM A SINGLE SET OF BLOOD CULTURES WHEN MULTIPLE SETS ARE DRAWN IS UNCERTAIN. PLEASE NOTIFY THE MICROBIOLOGY DEPARTMENT WITHIN ONE WEEK IF SPECIATION AND SENSITIVITIES ARE REQUIRED. Performed at [facility], [address], [place], [place] [address]    Report Status 03/08/2023 FINAL  Final  Blood Culture ID Panel (Reflexed)     Status: Abnormal   Collection Time: 03/05/23  4:34 PM  Result Value Ref Range Status   Enterococcus faecalis NOT  DETECTED NOT DETECTED Final   Enterococcus Faecium NOT DETECTED NOT DETECTED Final   Listeria monocytogenes NOT DETECTED NOT DETECTED Final   Staphylococcus species DETECTED (A) NOT DETECTED Final    Comment: CRITICAL RESULT CALLED TO, READ BACK BY AND VERIFIED WITH: PHARMD J. LEDFORD 03/07/23 @ 0424 BY AB    Staphylococcus aureus (BCID) NOT DETECTED NOT DETECTED Final   Staphylococcus epidermidis DETECTED (A) NOT DETECTED Final    Comment: Methicillin (oxacillin) resistant coagulase negative staphylococcus. Possible blood culture contaminant (unless isolated from more than one blood culture draw or clinical case suggests pathogenicity). No antibiotic treatment is indicated for blood  culture contaminants. CRITICAL RESULT CALLED TO, READ BACK BY AND VERIFIED WITH: PHARMD J. LEDFORD 03/07/23 @ 0424 BY AB    Staphylococcus lugdunensis NOT DETECTED NOT DETECTED Final   Streptococcus species NOT DETECTED NOT DETECTED Final   Streptococcus agalactiae NOT DETECTED NOT DETECTED Final   Streptococcus pneumoniae NOT DETECTED NOT DETECTED Final   Streptococcus pyogenes NOT DETECTED NOT DETECTED Final   A.calcoaceticus-baumannii NOT DETECTED NOT DETECTED Final   Bacteroides fragilis NOT DETECTED NOT DETECTED Final   Enterobacterales NOT DETECTED NOT DETECTED Final   Enterobacter cloacae complex NOT DETECTED NOT DETECTED Final   Escherichia coli NOT DETECTED NOT DETECTED Final   Klebsiella aerogenes NOT DETECTED NOT DETECTED Final   Klebsiella oxytoca NOT DETECTED NOT DETECTED Final   Klebsiella pneumoniae NOT DETECTED NOT DETECTED Final   Proteus species NOT DETECTED NOT DETECTED Final   Salmonella species NOT DETECTED NOT DETECTED Final   Serratia marcescens NOT DETECTED NOT DETECTED Final   Haemophilus influenzae NOT DETECTED NOT DETECTED Final   Neisseria meningitidis NOT DETECTED NOT DETECTED Final   Pseudomonas aeruginosa NOT DETECTED NOT DETECTED Final   Stenotrophomonas maltophilia NOT  DETECTED NOT DETECTED Final   Candida albicans NOT DETECTED NOT DETECTED Final   Candida auris NOT DETECTED NOT DETECTED Final   Candida glabrata NOT DETECTED NOT DETECTED Final   Candida krusei NOT  DETECTED NOT DETECTED Final   Candida parapsilosis NOT DETECTED NOT DETECTED Final   Candida tropicalis NOT DETECTED NOT DETECTED Final   Cryptococcus neoformans/gattii NOT DETECTED NOT DETECTED Final   Methicillin resistance mecA/C DETECTED (A) NOT DETECTED Final    Comment: CRITICAL RESULT CALLED TO, READ BACK BY AND VERIFIED WITH: PHARMD J. LEDFORD 03/07/23 @ 0424 BY AB Performed at [facility], [address] [place], [place] [address]        Radiology Studies: DG CHEST PORT 1 VIEW  Result Khan: 03/09/2023 CLINICAL DATA:  Dyspnea EXAM: PORTABLE CHEST 1 VIEW COMPARISON:  Chest x-ray dated March 05, 2019 FINDINGS: Cardiac and mediastinal contours are unchanged. Newly visible right basilar opacity. No evidence of pneumothorax. IMPRESSION: Newly visible right basilar opacity which is likely due to a combination of atelectasis and small pleural effusion. Not well visualized on prior exam, likely due to overlying soft tissues. Electronically Signed   By: Allegra Lai M.D.   On: 03/09/2023 12:42        Scheduled Meds:  digoxin  0.125 mg Oral Daily   diltiazem  180 mg Oral Daily   furosemide  40 mg Intravenous BID   insulin aspart  0-20 Units Subcutaneous TID WC   insulin aspart  0-5 Units Subcutaneous QHS   lactulose  10 g Oral TID   lidocaine  1 patch Transdermal Q24H   metoprolol tartrate  25 mg Oral BID   midodrine  10 mg Oral TID WC   rifaximin  550 mg Oral BID   Continuous Infusions:   ceFAZolin (ANCEF) IV 2 g (03/10/23 0648)     LOS: 5 days    Osvaldo Shipper, MD [facility]   03/10/2023, 10:02 AM

## 2023-03-11 DIAGNOSIS — I5033 Acute on chronic diastolic (congestive) heart failure: Secondary | ICD-10-CM

## 2023-03-11 DIAGNOSIS — A419 Sepsis, unspecified organism: Secondary | ICD-10-CM | POA: Diagnosis not present

## 2023-03-11 DIAGNOSIS — L0390 Cellulitis, unspecified: Secondary | ICD-10-CM | POA: Diagnosis not present

## 2023-03-11 DIAGNOSIS — I4821 Permanent atrial fibrillation: Secondary | ICD-10-CM | POA: Diagnosis not present

## 2023-03-11 LAB — BASIC METABOLIC PANEL
Anion gap: 8 (ref 5–15)
BUN: 18 mg/dL (ref 8–23)
CO2: 28 mmol/L (ref 22–32)
Calcium: 8.6 mg/dL — ABNORMAL LOW (ref 8.9–10.3)
Chloride: 100 mmol/L (ref 98–111)
Creatinine, Ser: 1.13 mg/dL — ABNORMAL HIGH (ref 0.44–1.00)
GFR, Estimated: 53 mL/min — ABNORMAL LOW (ref >60)
Glucose, Bld: 117 mg/dL — ABNORMAL HIGH (ref 70–99)
Potassium: 3.4 mmol/L — ABNORMAL LOW (ref 3.5–5.1)
Sodium: 136 mmol/L (ref 135–145)

## 2023-03-11 LAB — GLUCOSE, CAPILLARY
Glucose-Capillary: 95 mg/dL (ref 70–99)
Glucose-Capillary: 66 mg/dL — ABNORMAL LOW (ref 70–99)
Glucose-Capillary: 94 mg/dL (ref 70–99)
Glucose-Capillary: 106 mg/dL — ABNORMAL HIGH (ref 70–99)
Glucose-Capillary: 93 mg/dL (ref 70–99)

## 2023-03-11 LAB — DIGOXIN LEVEL: Digoxin Level: 0.3 ng/mL — ABNORMAL LOW (ref 0.8–2.0)

## 2023-03-11 MED ORDER — TORSEMIDE 20 MG PO TABS
20.0000 mg | ORAL_TABLET | Freq: Two times a day (BID) | ORAL | Status: DC
  Administered 2023-03-11 – 2023-03-12 (×3): 20 mg via ORAL
  Filled 2023-03-11 (×3): qty 1

## 2023-03-11 MED ORDER — POTASSIUM CHLORIDE CRYS ER 20 MEQ PO TBCR
40.0000 meq | EXTENDED_RELEASE_TABLET | Freq: Once | ORAL | Status: AC
  Administered 2023-03-11: 40 meq via ORAL
  Filled 2023-03-11: qty 2

## 2023-03-11 MED ORDER — DIGOXIN 125 MCG PO TABS
0.2500 mg | ORAL_TABLET | Freq: Every day | ORAL | Status: DC
  Administered 2023-03-12: 0.25 mg via ORAL
  Filled 2023-03-11: qty 2

## 2023-03-11 NOTE — Progress Notes (Signed)
Rounding Note    Patient Name: Hannah Khan Date of Encounter: 03/11/2023  [facility] Cardiologist: Chilton Si, MD   Subjective   No issues overnight. Negative 743 mL yesterday - overall 8L negative. Plan transition to oral torsemide 20 mg BID today. AFib rates controlled, now on long-acting diltiazem. Digoxin level today is 0.3 (given weight and Vd, probably could be on .25 mg daily dosing).  Inpatient Medications    Scheduled Meds:  cephALEXin  500 mg Oral Q8H   digoxin  0.125 mg Oral Daily   diltiazem  180 mg Oral Daily   furosemide  40 mg Intravenous BID   insulin aspart  0-20 Units Subcutaneous TID WC   insulin aspart  0-5 Units Subcutaneous QHS   lactulose  10 g Oral TID   lidocaine  1 patch Transdermal Q24H   metoprolol tartrate  25 mg Oral BID   midodrine  10 mg Oral TID WC   rifaximin  550 mg Oral BID   Continuous Infusions:   PRN Meds: acetaminophen, albuterol, morphine injection, ondansetron **OR** ondansetron (ZOFRAN) IV   Vital Signs    Vitals:   03/10/23 2007 03/11/23 0158 03/11/23 0510 03/11/23 0819  BP: 120/70 (!) 103/51 114/60 (!) 99/56  Pulse:  80 94 92  Resp: 18 17 17 18   Temp: 98.7 F (37.1 C) 98.2 F (36.8 C) 98.9 F (37.2 C) 98.3 F (36.8 C)  TempSrc: Oral Oral Axillary Oral  SpO2: 98% 97% 100% 99%  Weight:   (!) 151.7 kg   Height:        Intake/Output Summary (Last 24 hours) at 03/11/2023 1014 Last data filed at 03/11/2023 0829 Gross per 24 hour  Intake 717 ml  Output 1450 ml  Net -733 ml      03/11/2023    5:10 AM 03/10/2023    5:00 AM 03/09/2023    6:01 AM  Last 3 Weights  Weight (lbs) 334 lb 7 oz 334 lb 3.2 oz 336 lb 6.4 oz  Weight (kg) 151.7 kg 151.592 kg 152.59 kg      Telemetry    Atrial fibrillation rate elevated- Personally Reviewed  Physical Exam   GEN: WD NAD Neck: supple, JVP difficult to assess Cardiac: irregular, 2/6 systolic murmur, no rub Respiratory: CTA; no wheeze GI: Soft, NT/ND,  no masses MS: trace edema Neuro: no focal findings Psych: Normal affect   Labs    Chemistry Recent Labs  Lab 03/05/23 1549 03/06/23 0104 03/07/23 0059 03/08/23 0139 03/09/23 0839 03/10/23 0115 03/11/23 0039  NA 136 134* 134* 136 135 137 136  K 4.2 3.3* 2.9* 2.8* 2.9* 3.6 3.4*  CL 103 100 97* 100 98 100 100  CO2 23 22 24 25 26 27 28   GLUCOSE 149* 157* 161* 109* 166* 129* 117*  BUN 18 22 28* 25* 20 21 18   CREATININE 1.38* 1.59* 1.68* 1.37* 1.24* 1.21* 1.13*  CALCIUM 8.4* 8.1* 8.4* 8.3* 8.4* 8.5* 8.6*  MG 1.8  --  1.8 1.8 1.7  --   --   PROT 5.9* 5.4* 5.7*  --   --   --   --   ALBUMIN 2.7* 2.3* 2.9*  --   --   --   --   AST 42* 35 37  --   --   --   --   ALT 21 22 22   --   --   --   --   ALKPHOS 71 67 52  --   --   --   --  BILITOT 5.0* 4.8* 4.2*  --   --   --   --   GFRNONAA 42* 35* 33* 42* 48* 49* 53*  ANIONGAP 10 12 13 11 11 10 8     Hematology Recent Labs  Lab 03/07/23 0059 03/08/23 0139 03/10/23 0115  WBC 2.4* 2.5* 3.2*  RBC 2.97* 3.04* 3.26*  HGB 9.8* 10.0* 11.0*  HCT 28.6* 29.3* 31.8*  MCV 96.3 96.4 97.5  MCH 33.0 32.9 33.7  MCHC 34.3 34.1 34.6  RDW 15.4 15.0 15.1  PLT 19* 25* 39*    BNP Recent Labs  Lab 03/05/23 1549  BNP 362.6*     Radiology    No results found.    Patient Profile     67 y.o. female with past medical history of chronic diastolic congestive heart failure, permanent atrial fibrillation, sleep apnea, morbid obesity, cirrhosis complicated by portal hypertension, hypertension, diabetes mellitus being evaluated for atrial fibrillation and acute on chronic diastolic congestive heart failure. Echocardiogram this admission shows normal LV function, severe biatrial enlargement, moderate tricuspid regurgitation and mild to moderate aortic stenosis with mean gradient 15 mmHg.  Assessment & Plan    1 permanent atrial fibrillation-heart rate is now controlled.  Change Cardizem to 180 mg daily.  Continue low-dose metoprolol and digoxin.   Can increase digoxin to 0.25 mg daily (level 0.3 today, calculated dose given weight is ~425 mcg daily). Patient is not on anticoagulation given baseline thrombocytopenia.   2 acute on chronic diastolic congestive heart failure-volume status is much improved.  Will transition to Demadex 20 mg twice daily today.  Given morbid obesity, pannus and active cellulitis I do not think she is a good candidate for SGLT2 inhibitor.    3 mild to moderate aortic stenosis-will need follow-up echoes in the future.  4 cellulitis-antibiotics per primary care  5 cirrhosis/thrombocytopenia/diabetes mellitus-Per primary care.  6 hypokalemia-supplement  For questions or updates, please contact [facility] Please consult [email or web address] for contact info under   Chrystie Nose, MD, Milagros Loll  [place]  [facility]  Medical Director of the [facility]  [facility] Diplomate of the American Board of Clinical Lipidology Attending Cardiologist  Direct Dial: [phone]  Fax: [phone]  Website:  [email or web address]  Chrystie Nose, MD  03/11/2023, 10:14 AM

## 2023-03-11 NOTE — Progress Notes (Signed)
PROGRESS NOTE    Hannah Khan  WUJ:[id number] DOB: 1956-08-20 DOA: 03/05/2023 PCP: Darrow Bussing, MD  Chief Complaint  Patient presents with   Shortness of Breath    Brief Narrative:  Hannah Khan is a 67 y.o. female with medical history significant of multiple medical problems including morbid obesity, type 2 diabetes, history of recurrent GI bleed, Obstructive sleep apnea, essential hypertension, atrial fibrillation, liver cirrhosis secondary to NASH, pancytopenia secondary to liver disease, chronic diastolic heart failure, portal hypertension as a result of cirrhosis, who presents with husband complaining of shortness of breath.  She was admitted with abdominal cellulitis and atrial fibrillation with RVR.  Started on abx.    Assessment & Plan:   Hypotension Appears to be chronic based on previous trends. Started on midodrine considering her liver cirrhosis and chronic hypotension.  She was also given albumin. -BP now improved and stable  Abdominal wall cellulitis with sepsis  Sepsis was ruled in with fever, tachycardia Blood cultures -> growing staph epidermidis, suspect this is a contaminant  Patient started on cefazolin. She apparently has a previous history of same.   -Cellulitis improving, will switch to oral Keflex yesterday, discontinue antibiotics tomorrow  Atrial Fibrillation with RVR Cardiology continues to follow. -Heart rate better controlled on metoprolol, Cardizem and digoxin -Not a candidate for anticoagulation due to thrombocytopenia.    NASH Cirrhosis/Severe Thrombocytopenia  Continue xifaxan, lactulose Plan for TIPs was aborted due to elevated right heart pressures 12/2022 Has a history of grade 1 esophageal varices (04/2022) -> beta blocker currently on hold with hypotension.  Acute on chronic diastolic CHF  Echocardiogram showed normal left ventricular systolic function.  Diastolic function could not be ascertained.   -Has improved with diuresis, she  is 8.4 L negative -Transitioning to oral torsemide today Jardiance discontinued due to recurrent cellulitis.  AKI/hypokalemia In the setting of acute infection, sepsis and diuretics.   Renal function has improved and stable.   T2DM Currently on SSI On jardiance outpatient - this should be discontinued at DC Uses GLP 1 agonist outpatient    OSA CPAP  Normocytic anemia Stable hemoglobin.  No evidence of overt bleeding.  Chronic Pain  DVT prophylaxis: severe thrombocytopenia, none Code Status: full Family Communication: none Disposition: Home tomorrow   Consultants:  cardiology  Procedures:  none  Antimicrobials:  Anti-infectives (From admission, onward)    Start     Dose/Rate Route Frequency Ordered Stop   03/10/23 1400  cephALEXin (KEFLEX) capsule 500 mg        500 mg Oral Every 8 hours 03/10/23 1006 03/15/23 1359   03/06/23 1400  ceFAZolin (ANCEF) IVPB 2g/100 mL premix  Status:  Discontinued        2 g 200 mL/hr over 30 Minutes Intravenous Every 8 hours 03/06/23 1052 03/10/23 1006   03/06/23 1200  rifaximin (XIFAXAN) tablet 550 mg        550 mg Oral 2 times daily 03/06/23 1054     03/05/23 2100  cefTRIAXone (ROCEPHIN) 2 g in sodium chloride 0.9 % 100 mL IVPB  Status:  Discontinued        2 g 200 mL/hr over 30 Minutes Intravenous Every 24 hours 03/05/23 2051 03/05/23 2053   03/05/23 1630  cefTRIAXone (ROCEPHIN) 2 g in sodium chloride 0.9 % 100 mL IVPB  Status:  Discontinued        2 g 200 mL/hr over 30 Minutes Intravenous Every 24 hours 03/05/23 1629 03/06/23 1052  Subjective: -Feels better overall  Objective: Vitals:   03/11/23 0510 03/11/23 0819 03/11/23 1033 03/11/23 1034  BP: 114/60 (!) 99/56  (!) 104/58  Pulse: 94 92 80   Resp: 17 18    Temp: 98.9 F (37.2 C) 98.3 F (36.8 C)    TempSrc: Axillary Oral    SpO2: 100% 99%    Weight: (!) 151.7 kg     Height:        Intake/Output Summary (Last 24 hours) at 03/11/2023 1156 Last data filed at  03/11/2023 1610 Gross per 24 hour  Intake 717 ml  Output 1350 ml  Net -633 ml   Filed Weights   03/09/23 0601 03/10/23 0500 03/11/23 0510  Weight: (!) 152.6 kg (!) 151.6 kg (!) 151.7 kg    Examination:  Obese chronically ill female sitting up in bed, AAOx3 HEENT: Neck obese unable to assess JVD CVS: S1-S2, regular rhythm Lungs: Decreased breath sounds at the bases otherwise clear Abdomen: Obese, soft, mild lower abdominal wall edema Extremities: Trace edema, chronic skin changes No obvious focal neurological deficits.   Data Reviewed: I have personally reviewed following labs and reports of imaging studies  CBC: Recent Labs  Lab 03/05/23 1549 03/06/23 0104 03/07/23 0059 03/08/23 0139 03/10/23 0115  WBC 7.4 7.3 2.4* 2.5* 3.2*  NEUTROABS 6.7  --  1.7  --   --   HGB 11.7* 11.2* 9.8* 10.0* 11.0*  HCT 36.4 33.5* 28.6* 29.3* 31.8*  MCV 101.4* 100.9* 96.3 96.4 97.5  PLT 30* 20* 19* 25* 39*    Basic Metabolic Panel: Recent Labs  Lab 03/05/23 1549 03/06/23 0104 03/07/23 0059 03/08/23 0139 03/09/23 0839 03/10/23 0115 03/11/23 0039  NA 136   < > 134* 136 135 137 136  K 4.2   < > 2.9* 2.8* 2.9* 3.6 3.4*  CL 103   < > 97* 100 98 100 100  CO2 23   < > 24 25 26 27 28   GLUCOSE 149*   < > 161* 109* 166* 129* 117*  BUN 18   < > 28* 25* 20 21 18   CREATININE 1.38*   < > 1.68* 1.37* 1.24* 1.21* 1.13*  CALCIUM 8.4*   < > 8.4* 8.3* 8.4* 8.5* 8.6*  MG 1.8  --  1.8 1.8 1.7  --   --   PHOS  --   --  2.9 2.8  --   --   --    < > = values in this interval not displayed.    GFR: Estimated Creatinine Clearance: 74.4 mL/min (A) (by C-G formula based on SCr of 1.13 mg/dL (H)).  Liver Function Tests: Recent Labs  Lab 03/05/23 1549 03/06/23 0104 03/07/23 0059  AST 42* 35 37  ALT 21 22 22   ALKPHOS 71 67 52  BILITOT 5.0* 4.8* 4.2*  PROT 5.9* 5.4* 5.7*  ALBUMIN 2.7* 2.3* 2.9*    CBG: Recent Labs  Lab 03/10/23 1101 03/10/23 1557 03/10/23 2111 03/11/23 0604  03/11/23 1127  GLUCAP 87 97 112* 95 66*     Recent Results (from the past 240 hour(s))  Blood Culture (routine x 2)     Status: None   Collection Time: 03/05/23  3:49 PM   Specimen: BLOOD  Result Value Ref Range Status   Specimen Description BLOOD BLOOD RIGHT HAND  Final   Special Requests   Final    BOTTLES DRAWN AEROBIC AND ANAEROBIC Blood Culture results may not be optimal due to an inadequate volume of blood received  in culture bottles   Culture   Final    NO GROWTH 5 DAYS Performed at [facility], [address] [place], [place] [address]    Report Status 03/10/2023 FINAL  Final  Blood Culture (routine x 2)     Status: Abnormal   Collection Time: 03/05/23  4:34 PM   Specimen: BLOOD  Result Value Ref Range Status   Specimen Description BLOOD SITE NOT SPECIFIED  Final   Special Requests   Final    BOTTLES DRAWN AEROBIC ONLY Blood Culture adequate volume   Culture  Setup Time   Final    GRAM POSITIVE COCCI IN CLUSTERS AEROBIC BOTTLE ONLY CRITICAL RESULT CALLED TO, READ BACK BY AND VERIFIED WITH: PHARMD J. LEDFORD 03/07/23 @ 0424 BY AB    Culture (A)  Final    STAPHYLOCOCCUS EPIDERMIDIS THE SIGNIFICANCE OF ISOLATING THIS ORGANISM FROM A SINGLE SET OF BLOOD CULTURES WHEN MULTIPLE SETS ARE DRAWN IS UNCERTAIN. PLEASE NOTIFY THE MICROBIOLOGY DEPARTMENT WITHIN ONE WEEK IF SPECIATION AND SENSITIVITIES ARE REQUIRED. Performed at [facility], [address], [place], [place] [address]    Report Status 03/08/2023 FINAL  Final  Blood Culture ID Panel (Reflexed)     Status: Abnormal   Collection Time: 03/05/23  4:34 PM  Result Value Ref Range Status   Enterococcus faecalis NOT DETECTED NOT DETECTED Final   Enterococcus Faecium NOT DETECTED NOT DETECTED Final   Listeria monocytogenes NOT DETECTED NOT DETECTED Final   Staphylococcus species DETECTED (A) NOT DETECTED Final    Comment: CRITICAL RESULT CALLED TO, READ BACK BY AND VERIFIED WITH: PHARMD J. LEDFORD 03/07/23 @  0424 BY AB    Staphylococcus aureus (BCID) NOT DETECTED NOT DETECTED Final   Staphylococcus epidermidis DETECTED (A) NOT DETECTED Final    Comment: Methicillin (oxacillin) resistant coagulase negative staphylococcus. Possible blood culture contaminant (unless isolated from more than one blood culture draw or clinical case suggests pathogenicity). No antibiotic treatment is indicated for blood  culture contaminants. CRITICAL RESULT CALLED TO, READ BACK BY AND VERIFIED WITH: PHARMD J. LEDFORD 03/07/23 @ 0424 BY AB    Staphylococcus lugdunensis NOT DETECTED NOT DETECTED Final   Streptococcus species NOT DETECTED NOT DETECTED Final   Streptococcus agalactiae NOT DETECTED NOT DETECTED Final   Streptococcus pneumoniae NOT DETECTED NOT DETECTED Final   Streptococcus pyogenes NOT DETECTED NOT DETECTED Final   A.calcoaceticus-baumannii NOT DETECTED NOT DETECTED Final   Bacteroides fragilis NOT DETECTED NOT DETECTED Final   Enterobacterales NOT DETECTED NOT DETECTED Final   Enterobacter cloacae complex NOT DETECTED NOT DETECTED Final   Escherichia coli NOT DETECTED NOT DETECTED Final   Klebsiella aerogenes NOT DETECTED NOT DETECTED Final   Klebsiella oxytoca NOT DETECTED NOT DETECTED Final   Klebsiella pneumoniae NOT DETECTED NOT DETECTED Final   Proteus species NOT DETECTED NOT DETECTED Final   Salmonella species NOT DETECTED NOT DETECTED Final   Serratia marcescens NOT DETECTED NOT DETECTED Final   Haemophilus influenzae NOT DETECTED NOT DETECTED Final   Neisseria meningitidis NOT DETECTED NOT DETECTED Final   Pseudomonas aeruginosa NOT DETECTED NOT DETECTED Final   Stenotrophomonas maltophilia NOT DETECTED NOT DETECTED Final   Candida albicans NOT DETECTED NOT DETECTED Final   Candida auris NOT DETECTED NOT DETECTED Final   Candida glabrata NOT DETECTED NOT DETECTED Final   Candida krusei NOT DETECTED NOT DETECTED Final   Candida parapsilosis NOT DETECTED NOT DETECTED Final   Candida  tropicalis NOT DETECTED NOT DETECTED Final  Cryptococcus neoformans/gattii NOT DETECTED NOT DETECTED Final   Methicillin resistance mecA/C DETECTED (A) NOT DETECTED Final    Comment: CRITICAL RESULT CALLED TO, READ BACK BY AND VERIFIED WITH: PHARMD J. LEDFORD 03/07/23 @ 0424 BY AB Performed at [facility], [address], [place], [place] [address]     Scheduled Meds:  cephALEXin  500 mg Oral Q8H   [START ON 03/12/2023] digoxin  0.25 mg Oral Daily   diltiazem  180 mg Oral Daily   insulin aspart  0-20 Units Subcutaneous TID WC   insulin aspart  0-5 Units Subcutaneous QHS   lactulose  10 g Oral TID   lidocaine  1 patch Transdermal Q24H   metoprolol tartrate  25 mg Oral BID   midodrine  10 mg Oral TID WC   rifaximin  550 mg Oral BID   torsemide  20 mg Oral BID   Continuous Infusions:     LOS: 6 days    Zannie Cove, MD [facility]   03/11/2023, 11:56 AM

## 2023-03-11 NOTE — Progress Notes (Signed)
Mobility Specialist Progress Note    03/11/23 1516  Mobility  Activity Ambulated with assistance in hallway  Level of Assistance Contact guard assist, steadying assist  Assistive Device Four wheel walker  Distance Ambulated (ft) 270 ft (135+135)  Activity Response Tolerated well  Mobility Referral Yes  $Mobility charge 1 Mobility  Mobility Specialist Start Time (ACUTE ONLY) 1440  Mobility Specialist Stop Time (ACUTE ONLY) 1514  Mobility Specialist Time Calculation (min) (ACUTE ONLY) 34 min   Pre-Mobility: 74 HR During Mobility: 108 HR Post-Mobility: 90 HR  Pt received sitting EOB and agreeable. Took x1 seated rest break c/o arthritic pain. Returned to sitting EOB with call bell in reach.   [place] Mobility Specialist  Please Neurosurgeon or Rehab Office at [phone]

## 2023-03-11 NOTE — Progress Notes (Signed)
Received referral for HHPT. Met with pt to discuss preference for a [facility] agency. Pt plans to return home with the support of her family. She has used [facility] in the past and prefers to use them again. Contacted Denyse Amass with [facility] and he accepted the referral.

## 2023-03-12 DIAGNOSIS — L0390 Cellulitis, unspecified: Secondary | ICD-10-CM | POA: Diagnosis not present

## 2023-03-12 DIAGNOSIS — I4821 Permanent atrial fibrillation: Secondary | ICD-10-CM | POA: Diagnosis not present

## 2023-03-12 DIAGNOSIS — A419 Sepsis, unspecified organism: Secondary | ICD-10-CM | POA: Diagnosis not present

## 2023-03-12 DIAGNOSIS — I5033 Acute on chronic diastolic (congestive) heart failure: Secondary | ICD-10-CM | POA: Diagnosis not present

## 2023-03-12 LAB — GLUCOSE, CAPILLARY
Glucose-Capillary: 82 mg/dL (ref 70–99)
Glucose-Capillary: 98 mg/dL (ref 70–99)

## 2023-03-12 LAB — BASIC METABOLIC PANEL
Anion gap: 9 (ref 5–15)
BUN: 18 mg/dL (ref 8–23)
CO2: 24 mmol/L (ref 22–32)
Calcium: 8.6 mg/dL — ABNORMAL LOW (ref 8.9–10.3)
Chloride: 101 mmol/L (ref 98–111)
Creatinine, Ser: 1.04 mg/dL — ABNORMAL HIGH (ref 0.44–1.00)
GFR, Estimated: 59 mL/min — ABNORMAL LOW (ref >60)
Glucose, Bld: 123 mg/dL — ABNORMAL HIGH (ref 70–99)
Potassium: 3.4 mmol/L — ABNORMAL LOW (ref 3.5–5.1)
Sodium: 134 mmol/L — ABNORMAL LOW (ref 135–145)

## 2023-03-12 MED ORDER — METOPROLOL TARTRATE 25 MG PO TABS: 25.0000 mg | ORAL_TABLET | Freq: Two times a day (BID) | ORAL | 0 refills | Status: DC

## 2023-03-12 MED ORDER — MIDODRINE HCL 10 MG PO TABS: 10.0000 mg | ORAL_TABLET | Freq: Two times a day (BID) | ORAL | 0 refills | Status: DC

## 2023-03-12 MED ORDER — POTASSIUM CHLORIDE CRYS ER 20 MEQ PO TBCR
40.0000 meq | EXTENDED_RELEASE_TABLET | Freq: Once | ORAL | Status: AC
  Administered 2023-03-12: 40 meq via ORAL
  Filled 2023-03-12: qty 2

## 2023-03-12 MED ORDER — POTASSIUM CHLORIDE ER 10 MEQ PO CPCR
ORAL_CAPSULE | ORAL | Status: DC
Start: 2023-03-12 — End: 2023-04-18

## 2023-03-12 MED ORDER — DIGOXIN 250 MCG PO TABS: 0.2500 mg | ORAL_TABLET | Freq: Every day | ORAL | 0 refills | Status: DC

## 2023-03-12 MED ORDER — TORSEMIDE 20 MG PO TABS: 20.0000 mg | ORAL_TABLET | Freq: Two times a day (BID) | ORAL | 0 refills | Status: DC

## 2023-03-12 NOTE — Discharge Summary (Signed)
Physician Discharge Summary  Hannah Khan OEU:[id number] DOB: 01-Oct-1955 DOA: 03/05/2023  PCP: Darrow Bussing, MD  Admit date: 03/05/2023 Discharge date: 03/12/2023  Time spent: 45 minutes  Recommendations for Outpatient Follow-up:  Cardiology Dr. Duke Salvia in 2 weeks PCP in 1 week, please check BMP at follow-up  Discharge Diagnoses:  Principal Problem:   Sepsis due to cellulitis (HCC) Acute on chronic diastolic CHF   Morbid obesity-   Obstructive sleep apnea   Essential hypertension   Cellulitis, abdominal wall   Type 2 diabetes mellitus without complication, without long-term current use of insulin (HCC)   Atrial fibrillation with RVR (HCC)   Liver cirrhosis secondary to NASH (nonalcoholic steatohepatitis) (HCC)   Severe sepsis (HCC)   Acute diastolic congestive heart failure (HCC)   Idiopathic hypotension   Discharge Condition: Improved  Diet recommendation: Low-sodium, heart healthy  Filed Weights   03/10/23 0500 03/11/23 0510 03/12/23 0506  Weight: (!) 151.6 kg (!) 151.7 kg (!) 153.6 kg    History of present illness:  67 y.o. female with medical history significant of multiple medical problems including morbid obesity, type 2 diabetes, history of recurrent GI bleed, Obstructive sleep apnea, essential hypertension, atrial fibrillation, liver cirrhosis secondary to NASH, pancytopenia secondary to liver disease, chronic diastolic heart failure, portal hypertension as a result of cirrhosis, who presents with husband complaining of shortness of breath.  She was admitted with abdominal cellulitis and atrial fibrillation with RVR.  Started on abx.    Hospital Course:   Hypotension Appears to be chronic based on previous trends. Started on midodrine considering her liver cirrhosis and chronic hypotension.  She was also given albumin earlier this admission -BP now improved and stable   Abdominal wall cellulitis with sepsis  Sepsis was ruled in with fever,  tachycardia Blood cultures -> growing staph epidermidis, suspect this is a contaminant  Patient started on cefazolin.  -Cellulitis improving, will switch to oral Keflex, antibiotics now discontinued   Atrial Fibrillation with RVR -Cardiology following, heart rate better controlled on metoprolol, Cardizem and digoxin -Not a candidate for anticoagulation due to thrombocytopenia.     NASH Cirrhosis/Severe Thrombocytopenia  Continue xifaxan, lactulose Plan for TIPs was aborted due to elevated right heart pressures 12/2022 Has a history of grade 1 esophageal varices (04/2022) ->    Acute on chronic diastolic CHF  Echocardiogram showed normal left ventricular systolic function.  Diastolic function could not be ascertained.   -Has improved with diuresis, she is 8.4 L negative -Followed by cardiology, transition to oral torsemide 20 Mg twice daily, along with KCl Jardiance discontinued due to recurrent cellulitis.   AKI/hypokalemia In the setting of acute infection, sepsis and diuretics.   Renal function has improved and stable.    T2DM Currently on SSI On jardiance outpatient - this should be discontinued at DC Uses GLP 1 agonist outpatient     OSA CPAP   Normocytic anemia Stable hemoglobin.  No evidence of overt bleeding.   Chronic Pain Discharge Exam: Vitals:   03/12/23 0752 03/12/23 1140  BP: (!) 105/53 115/66  Pulse: 90 87  Resp: 18 17  Temp: (!) 97.5 F (36.4 C) (!) 97.5 F (36.4 C)  SpO2: 100% 96%    Gen: Awake, Alert, Oriented X 3, morbidly obese, no distress HEENT: no JVD Lungs: Good air movement bilaterally, CTAB CVS: S1S2/RRR Abd: soft, Non tender, non distended, BS present Extremities: Trace edema, chronic skin changes Skin: no new rashes on exposed skin   Discharge Instructions   Discharge  Instructions     Diet - low sodium heart healthy   Complete by: As directed    Increase activity slowly   Complete by: As directed       Allergies as of  03/12/2023       Reactions   Celecoxib Other (See Comments)   "speech slurred" with celebrex   Shellfish Allergy Swelling, Rash   TINGLING AND SWELLING OF LIPS. LARGE WHELPS QUARTER-SIZE   Barium-containing Compounds Other (See Comments)   TACHYCARDIA   Prednisone Other (See Comments)   TACHYCARDIA   Statins Other (See Comments)   AGGRAVATED FIBROMYALGIA   Fentanyl    Hallucinations    Ibuprofen Other (See Comments)   UNSPECIFIED SPECIFIC REACTION >> "DUE TO PLATELETS"   Sglt2 Inhibitors    Cellulitis of pannus   Tramadol    Hallucination   Tylenol [acetaminophen] Other (See Comments)   UNSPECIFIED SPECIFIC REACTION >> "DUE TO PLATELETS"   Zetia [ezetimibe]    Headache        Medication List     STOP taking these medications    bumetanide 2 MG tablet Commonly known as: BUMEX   empagliflozin 25 MG Tabs tablet Commonly known as: JARDIANCE   metoprolol succinate 100 MG 24 hr tablet Commonly known as: TOPROL-XL   spironolactone 25 MG tablet Commonly known as: ALDACTONE       TAKE these medications    albuterol 108 (90 Base) MCG/ACT inhaler Commonly known as: VENTOLIN HFA Inhale 1-2 puffs into the lungs every 6 (six) hours as needed for wheezing or shortness of breath. What changed: Another medication with the same name was removed. Continue taking this medication, and follow the directions you see here.   diclofenac Sodium 1 % Gel Commonly known as: VOLTAREN Apply 1 Application topically at bedtime as needed (pain).   digoxin 0.25 MG tablet Commonly known as: LANOXIN Take 1 tablet (0.25 mg total) by mouth daily. Start taking on: March 13, 2023   diltiazem 180 MG 24 hr capsule Commonly known as: CARDIZEM CD TAKE 1 CAPSULE BY MOUTH EVERY DAY What changed:  how much to take when to take this   lactulose 10 GM/15ML solution Commonly known as: CHRONULAC Take 10 g by mouth 3 (three) times daily.   metolazone 5 MG tablet Commonly known as:  ZAROXOLYN Take 1 tablet (5 mg total) by mouth once a week. Take one tablet on Sundays 30 minutes before Bumex   metoprolol tartrate 25 MG tablet Commonly known as: LOPRESSOR Take 1 tablet (25 mg total) by mouth 2 (two) times daily.   midodrine 10 MG tablet Commonly known as: PROAMATINE Take 1 tablet (10 mg total) by mouth 2 (two) times daily with a meal.   OneTouch Delica Plus Lancet33G Misc Apply topically 3 (three) times daily.   OneTouch Verio test strip Generic drug: glucose blood 1 each 3 (three) times daily.   Ozempic (2 MG/DOSE) 8 MG/3ML Sopn Generic drug: Semaglutide (2 MG/DOSE) Inject 2 mg into the skin once a week. What changed: when to take this   potassium chloride 10 MEQ CR capsule Commonly known as: MICRO-K Take 40 mEq ( 4 tablets)  by mouth twice per day What changed: additional instructions   PreserVision AREDS 2 Caps Take 1 capsule by mouth 2 (two) times daily.   Systane Balance 0.6 % Soln Generic drug: Propylene Glycol Place 1 drop into both eyes 2 (two) times daily.   torsemide 20 MG tablet Commonly known as: DEMADEX Take 1 tablet (  20 mg total) by mouth 2 (two) times daily.   Xifaxan 550 MG Tabs tablet Generic drug: rifaximin Take 550 mg by mouth 2 (two) times daily.       Allergies  Allergen Reactions   Celecoxib Other (See Comments)    "speech slurred" with celebrex   Shellfish Allergy Swelling and Rash    TINGLING AND SWELLING OF LIPS. LARGE WHELPS QUARTER-SIZE   Barium-Containing Compounds Other (See Comments)    TACHYCARDIA   Prednisone Other (See Comments)    TACHYCARDIA   Statins Other (See Comments)    AGGRAVATED FIBROMYALGIA   Fentanyl     Hallucinations    Ibuprofen Other (See Comments)    UNSPECIFIED SPECIFIC REACTION >> "DUE TO PLATELETS"   Sglt2 Inhibitors     Cellulitis of pannus   Tramadol     Hallucination    Tylenol [Acetaminophen] Other (See Comments)    UNSPECIFIED SPECIFIC REACTION >> "DUE TO PLATELETS"    Zetia [Ezetimibe]     Headache    Follow-up Information     Koirala, Dibas, MD. Nyra Capes on 03/22/2023.   Specialty: Family Medicine Why: @4 :30pm Contact information: [address] [place] [address] [phone]         [facility], [facility] Follow up.   Specialty: Home Health Services Why: [facility] will provide home health physical therapy. Someone from the agency will be contacting you to arrange your appointments. Contact information: [address] [place] [address] [phone]                  The results of significant diagnostics from this hospitalization (including imaging, microbiology, ancillary and laboratory) are listed below for reference.    Significant Diagnostic Studies: DG CHEST PORT 1 VIEW  Result Date: 03/09/2023 CLINICAL DATA:  Dyspnea EXAM: PORTABLE CHEST 1 VIEW COMPARISON:  Chest x-ray dated March 05, 2019 FINDINGS: Cardiac and mediastinal contours are unchanged. Newly visible right basilar opacity. No evidence of pneumothorax. IMPRESSION: Newly visible right basilar opacity which is likely due to a combination of atelectasis and small pleural effusion. Not well visualized on prior exam, likely due to overlying soft tissues. Electronically Signed   By: Allegra Lai M.D.   On: 03/09/2023 12:42   ECHOCARDIOGRAM COMPLETE  Result Date: 03/07/2023    ECHOCARDIOGRAM REPORT   Patient Name:   Hannah Khan Date of Exam: 03/07/2023 Medical Rec #:  [id number]       Height:       67.0 in Accession #:    [id number]      Weight:       345.5 lb Date of Birth:  January 09, 1956       BSA:          2.551 m Patient Age:    67 years        BP:           112/59 mmHg Patient Gender: F               HR:           83 bpm. Exam Location:  Inpatient Procedure: 2D Echo, Cardiac Doppler, Color Doppler and Intracardiac            Opacification Agent Indications:    CHF- Diastolic  History:        Patient has prior history of  Echocardiogram examinations, most  recent 11/01/2022. CHF, Arrythmias:Atrial Fibrillation,                 Signs/Symptoms:Shortness of Breath and Hypotension; Risk                 Factors:Sleep Apnea, Diabetes, Hypertension and Morbid obesity.                 Liver cirrhosis secondary to NASH, portal HTN.  Sonographer:    Milda Smart Referring Phys: [phone] A CALDWELL POWELL JR  Sonographer Comments: Patient is obese and Technically difficult study due to poor echo windows. Image acquisition challenging due to patient body habitus and Image acquisition challenging due to respiratory motion. IMPRESSIONS  1. Left ventricular ejection fraction, by estimation, is 55 to 60%. The left ventricle has normal function. The left ventricle has no regional wall motion abnormalities. Left ventricular diastolic function could not be evaluated.  2. Right ventricular systolic function is normal. The right ventricular size is normal. There is mildly elevated pulmonary artery systolic pressure. The estimated right ventricular systolic pressure is 39.6 mmHg.  3. Left atrial size was severely dilated.  4. Right atrial size was severely dilated.  5. The mitral valve is normal in structure. No evidence of mitral valve regurgitation.  6. Tricuspid valve regurgitation is moderate.  7. The aortic valve is tricuspid. There is moderate calcification of the aortic valve. There is moderate thickening of the aortic valve. Aortic valve regurgitation is not visualized. Mild to moderate aortic valve stenosis.  8. The inferior vena cava is dilated in size with <50% respiratory variability, suggesting right atrial pressure of 15 mmHg. Comparison(s): No significant change from prior study. Prior images reviewed side by side. FINDINGS  Left Ventricle: Left ventricular ejection fraction, by estimation, is 55 to 60%. The left ventricle has normal function. The left ventricle has no regional wall motion abnormalities. Definity contrast  agent was given IV to delineate the left ventricular  endocardial borders. The left ventricular internal cavity size was normal in size. There is no left ventricular hypertrophy. Left ventricular diastolic function could not be evaluated due to atrial fibrillation. Left ventricular diastolic function could  not be evaluated. Right Ventricle: The right ventricular size is normal. No increase in right ventricular wall thickness. Right ventricular systolic function is normal. There is mildly elevated pulmonary artery systolic pressure. The tricuspid regurgitant velocity is 2.48  m/s, and with an assumed right atrial pressure of 15 mmHg, the estimated right ventricular systolic pressure is 39.6 mmHg. Left Atrium: Left atrial size was severely dilated. Right Atrium: Right atrial size was severely dilated. Pericardium: There is no evidence of pericardial effusion. Mitral Valve: The mitral valve is normal in structure. No evidence of mitral valve regurgitation. Tricuspid Valve: The tricuspid valve is grossly normal. Tricuspid valve regurgitation is moderate. Aortic Valve: The aortic valve is tricuspid. There is moderate calcification of the aortic valve. There is moderate thickening of the aortic valve. Aortic valve regurgitation is not visualized. Mild to moderate aortic stenosis is present. Aortic valve mean gradient measures 15.0 mmHg. Aortic valve peak gradient measures 21.4 mmHg. Pulmonic Valve: The pulmonic valve was not well visualized. Pulmonic valve regurgitation is not visualized. No evidence of pulmonic stenosis. Aorta: The aortic root and ascending aorta are structurally normal, with no evidence of dilitation. Venous: The inferior vena cava is dilated in size with less than 50% respiratory variability, suggesting right atrial pressure of 15 mmHg. IAS/Shunts: No atrial level shunt detected by color flow Doppler.  LEFT  VENTRICLE PLAX 2D LVIDd:         5.00 cm   Diastology LVIDs:         3.70 cm   LV e' medial:     9.90 cm/s LV PW:         0.90 cm   LV E/e' medial:  14.6 LV IVS:        0.90 cm   LV e' lateral:   9.36 cm/s LVOT diam:     2.20 cm   LV E/e' lateral: 15.5 LVOT Area:     3.80 cm  RIGHT VENTRICLE RV S prime:     11.10 cm/s TAPSE (M-mode): 2.0 cm LEFT ATRIUM              Index        RIGHT ATRIUM           Index LA diam:        5.70 cm  2.23 cm/m   RA Area:     27.70 cm LA Vol (A2C):   108.0 ml 42.33 ml/m  RA Volume:   85.40 ml  33.47 ml/m LA Vol (A4C):   100.0 ml 39.20 ml/m LA Biplane Vol: 105.0 ml 41.16 ml/m  AORTIC VALVE AV Vmax:      231.50 cm/s AV Vmean:     185.000 cm/s AV VTI:       0.616 m AV Peak Grad: 21.4 mmHg AV Mean Grad: 15.0 mmHg  AORTA Ao Root diam: 2.80 cm Ao Asc diam:  3.60 cm MITRAL VALVE                TRICUSPID VALVE MV Area (PHT): 3.95 cm     TR Peak grad:   24.6 mmHg MV Decel Time: 192 msec     TR Mean grad:   14.0 mmHg MV E velocity: 145.00 cm/s  TR Vmax:        248.00 cm/s                             TR Vmean:       177.0 cm/s                              SHUNTS                             Systemic Diam: 2.20 cm Rachelle Hora Croitoru MD Electronically signed by Thurmon Fair MD Signature Date/Time: 03/07/2023/2:58:19 PM    Final    [place] ASCITES (ABDOMEN LIMITED)  Result Date: 03/06/2023 CLINICAL DATA:  Cirrhosis. EXAM: LIMITED ABDOMEN ULTRASOUND FOR ASCITES TECHNIQUE: Limited ultrasound survey for ascites was performed in all four abdominal quadrants. COMPARISON:  CT angiogram 09/30/2022.  Ultrasound 03/25/2022 FINDINGS: No significant ascites seen in the 4 quadrants of the abdomen by ultrasound. Study somewhat limited by overlapping bowel gas and soft tissue IMPRESSION: No significant pockets of ascites seen. Electronically Signed   By: Karen Kays M.D.   On: 03/06/2023 11:40   DG Chest Portable 1 View  Result Date: 03/05/2023 CLINICAL DATA:  Shortness of breath EXAM: PORTABLE CHEST 1 VIEW COMPARISON:  11/12/2022 FINDINGS: Stable cardiomegaly. Aortic atherosclerosis. No focal  airspace consolidation, pleural effusion, or pneumothorax. IMPRESSION: Cardiomegaly without acute cardiopulmonary disease. Electronically Signed   By: Duanne Guess D.O.   On: 03/05/2023 17:00    Microbiology: Recent  Results (from the past 240 hour(s))  Blood Culture (routine x 2)     Status: None   Collection Time: 03/05/23  3:49 PM   Specimen: BLOOD  Result Value Ref Range Status   Specimen Description BLOOD BLOOD RIGHT HAND  Final   Special Requests   Final    BOTTLES DRAWN AEROBIC AND ANAEROBIC Blood Culture results may not be optimal due to an inadequate volume of blood received in culture bottles   Culture   Final    NO GROWTH 5 DAYS Performed at [facility], [address] [place], [place] [address]    Report Status 03/10/2023 FINAL  Final  Blood Culture (routine x 2)     Status: Abnormal   Collection Time: 03/05/23  4:34 PM   Specimen: BLOOD  Result Value Ref Range Status   Specimen Description BLOOD SITE NOT SPECIFIED  Final   Special Requests   Final    BOTTLES DRAWN AEROBIC ONLY Blood Culture adequate volume   Culture  Setup Time   Final    GRAM POSITIVE COCCI IN CLUSTERS AEROBIC BOTTLE ONLY CRITICAL RESULT CALLED TO, READ BACK BY AND VERIFIED WITH: PHARMD J. LEDFORD 03/07/23 @ 0424 BY AB    Culture (A)  Final    STAPHYLOCOCCUS EPIDERMIDIS THE SIGNIFICANCE OF ISOLATING THIS ORGANISM FROM A SINGLE SET OF BLOOD CULTURES WHEN MULTIPLE SETS ARE DRAWN IS UNCERTAIN. PLEASE NOTIFY THE MICROBIOLOGY DEPARTMENT WITHIN ONE WEEK IF SPECIATION AND SENSITIVITIES ARE REQUIRED. Performed at [facility], [address], [place], [place] [address]    Report Status 03/08/2023 FINAL  Final  Blood Culture ID Panel (Reflexed)     Status: Abnormal   Collection Time: 03/05/23  4:34 PM  Result Value Ref Range Status   Enterococcus faecalis NOT DETECTED NOT DETECTED Final   Enterococcus Faecium NOT DETECTED NOT DETECTED Final   Listeria monocytogenes NOT DETECTED NOT  DETECTED Final   Staphylococcus species DETECTED (A) NOT DETECTED Final    Comment: CRITICAL RESULT CALLED TO, READ BACK BY AND VERIFIED WITH: PHARMD J. LEDFORD 03/07/23 @ 0424 BY AB    Staphylococcus aureus (BCID) NOT DETECTED NOT DETECTED Final   Staphylococcus epidermidis DETECTED (A) NOT DETECTED Final    Comment: Methicillin (oxacillin) resistant coagulase negative staphylococcus. Possible blood culture contaminant (unless isolated from more than one blood culture draw or clinical case suggests pathogenicity). No antibiotic treatment is indicated for blood  culture contaminants. CRITICAL RESULT CALLED TO, READ BACK BY AND VERIFIED WITH: PHARMD J. LEDFORD 03/07/23 @ 0424 BY AB    Staphylococcus lugdunensis NOT DETECTED NOT DETECTED Final   Streptococcus species NOT DETECTED NOT DETECTED Final   Streptococcus agalactiae NOT DETECTED NOT DETECTED Final   Streptococcus pneumoniae NOT DETECTED NOT DETECTED Final   Streptococcus pyogenes NOT DETECTED NOT DETECTED Final   A.calcoaceticus-baumannii NOT DETECTED NOT DETECTED Final   Bacteroides fragilis NOT DETECTED NOT DETECTED Final   Enterobacterales NOT DETECTED NOT DETECTED Final   Enterobacter cloacae complex NOT DETECTED NOT DETECTED Final   Escherichia coli NOT DETECTED NOT DETECTED Final   Klebsiella aerogenes NOT DETECTED NOT DETECTED Final   Klebsiella oxytoca NOT DETECTED NOT DETECTED Final   Klebsiella pneumoniae NOT DETECTED NOT DETECTED Final   Proteus species NOT DETECTED NOT DETECTED Final   Salmonella species NOT DETECTED NOT DETECTED Final   Serratia marcescens NOT DETECTED NOT DETECTED Final   Haemophilus influenzae NOT DETECTED NOT DETECTED Final   Neisseria meningitidis NOT DETECTED NOT DETECTED  Final   Pseudomonas aeruginosa NOT DETECTED NOT DETECTED Final   Stenotrophomonas maltophilia NOT DETECTED NOT DETECTED Final   Candida albicans NOT DETECTED NOT DETECTED Final   Candida auris NOT DETECTED NOT DETECTED Final    Candida glabrata NOT DETECTED NOT DETECTED Final   Candida krusei NOT DETECTED NOT DETECTED Final   Candida parapsilosis NOT DETECTED NOT DETECTED Final   Candida tropicalis NOT DETECTED NOT DETECTED Final   Cryptococcus neoformans/gattii NOT DETECTED NOT DETECTED Final   Methicillin resistance mecA/C DETECTED (A) NOT DETECTED Final    Comment: CRITICAL RESULT CALLED TO, READ BACK BY AND VERIFIED WITH: PHARMD J. LEDFORD 03/07/23 @ 0424 BY AB Performed at [facility], [address] [place], [place] [address]      Labs: Basic Metabolic Panel: Recent Labs  Lab 03/05/23 1549 03/06/23 0104 03/07/23 0059 03/08/23 0139 03/09/23 0839 03/10/23 0115 03/11/23 0039 03/12/23 0040  NA 136   < > 134* 136 135 137 136 134*  K 4.2   < > 2.9* 2.8* 2.9* 3.6 3.4* 3.4*  CL 103   < > 97* 100 98 100 100 101  CO2 23   < > 24 25 26 27 28 24   GLUCOSE 149*   < > 161* 109* 166* 129* 117* 123*  BUN 18   < > 28* 25* 20 21 18 18   CREATININE 1.38*   < > 1.68* 1.37* 1.24* 1.21* 1.13* 1.04*  CALCIUM 8.4*   < > 8.4* 8.3* 8.4* 8.5* 8.6* 8.6*  MG 1.8  --  1.8 1.8 1.7  --   --   --   PHOS  --   --  2.9 2.8  --   --   --   --    < > = values in this interval not displayed.   Liver Function Tests: Recent Labs  Lab 03/05/23 1549 03/06/23 0104 03/07/23 0059  AST 42* 35 37  ALT 21 22 22   ALKPHOS 71 67 52  BILITOT 5.0* 4.8* 4.2*  PROT 5.9* 5.4* 5.7*  ALBUMIN 2.7* 2.3* 2.9*   No results for input(s): "LIPASE", "AMYLASE" in the last 168 hours. No results for input(s): "AMMONIA" in the last 168 hours. CBC: Recent Labs  Lab 03/05/23 1549 03/06/23 0104 03/07/23 0059 03/08/23 0139 03/10/23 0115  WBC 7.4 7.3 2.4* 2.5* 3.2*  NEUTROABS 6.7  --  1.7  --   --   HGB 11.7* 11.2* 9.8* 10.0* 11.0*  HCT 36.4 33.5* 28.6* 29.3* 31.8*  MCV 101.4* 100.9* 96.3 96.4 97.5  PLT 30* 20* 19* 25* 39*   Cardiac Enzymes: No results for input(s): "CKTOTAL", "CKMB", "CKMBINDEX", "TROPONINI" in the last 168  hours. BNP: BNP (last 3 results) Recent Labs    11/12/22 1443 03/05/23 1549  BNP 434.3* 362.6*    ProBNP (last 3 results) No results for input(s): "PROBNP" in the last 8760 hours.  CBG: Recent Labs  Lab 03/11/23 1213 03/11/23 1702 03/11/23 2134 03/12/23 0521 03/12/23 1139  GLUCAP 94 106* 93 82 98       Signed:  Zannie Cove MD.  [facility] 03/12/2023, 1:00 PM

## 2023-03-12 NOTE — Progress Notes (Signed)
Mobility Specialist Progress Note    03/12/23 1057  Mobility  Activity Ambulated with assistance in hallway  Level of Assistance Contact guard assist, steadying assist  Assistive Device Four wheel walker  Distance Ambulated (ft) 320 ft (160+160)  Activity Response Tolerated well  Mobility Referral Yes  $Mobility charge 1 Mobility  Mobility Specialist Start Time (ACUTE ONLY) 1042  Mobility Specialist Stop Time (ACUTE ONLY) 1055  Mobility Specialist Time Calculation (min) (ACUTE ONLY) 13 min   During Mobility: 125 HR Post-Mobility: 106 HR  Pt received in bed and agreeable. No complaints on walk. Took x1 seated rest break. Returned to sitting EOB with call bell in reach.   [place] Mobility Specialist  Please Neurosurgeon or Rehab Office at [phone]

## 2023-03-12 NOTE — Progress Notes (Signed)
Rounding Note    Patient Name: Hannah Khan Date of Encounter: 03/12/2023  [facility] Cardiologist: Chilton Si, MD   Subjective   Adjusted diuretics and digoxin yesterday. Still net negative 1.1L yesterday. Vitals stable - HR in the 80's. Creatinine further improved to 1.04 today.  Inpatient Medications    Scheduled Meds:  cephALEXin  500 mg Oral Q8H   digoxin  0.25 mg Oral Daily   diltiazem  180 mg Oral Daily   insulin aspart  0-20 Units Subcutaneous TID WC   insulin aspart  0-5 Units Subcutaneous QHS   lactulose  10 g Oral TID   lidocaine  1 patch Transdermal Q24H   metoprolol tartrate  25 mg Oral BID   midodrine  10 mg Oral TID WC   rifaximin  550 mg Oral BID   torsemide  20 mg Oral BID   Continuous Infusions:   PRN Meds: acetaminophen, albuterol, morphine injection, ondansetron **OR** ondansetron (ZOFRAN) IV   Vital Signs    Vitals:   03/12/23 0045 03/12/23 0506 03/12/23 0752 03/12/23 1140  BP: (!) 102/50 (!) 106/52 (!) 105/53 115/66  Pulse: 73 99 90 87  Resp: 18 18 18 17   Temp: 98.4 F (36.9 C) 98 F (36.7 C) (!) 97.5 F (36.4 C) (!) 97.5 F (36.4 C)  TempSrc: Oral Oral Oral Oral  SpO2: 100% 100% 100% 96%  Weight:  (!) 153.6 kg    Height:        Intake/Output Summary (Last 24 hours) at 03/12/2023 1247 Last data filed at 03/12/2023 1017 Gross per 24 hour  Intake 460 ml  Output 1800 ml  Net -1340 ml      03/12/2023    5:06 AM 03/11/2023    5:10 AM 03/10/2023    5:00 AM  Last 3 Weights  Weight (lbs) 338 lb 11.2 oz 334 lb 7 oz 334 lb 3.2 oz  Weight (kg) 153.633 kg 151.7 kg 151.592 kg      Telemetry    Atrial fibrillation in the 80's- Personally Reviewed  Physical Exam   GEN: WD NAD Neck: supple, JVP difficult to assess Cardiac: irregular, 2/6 systolic murmur, no rub Respiratory: CTA; no wheeze GI: Soft, NT/ND, no masses MS: trace edema Neuro: no focal findings Psych: Normal affect   Labs    Chemistry Recent Labs   Lab 03/05/23 1549 03/06/23 0104 03/07/23 0059 03/08/23 0139 03/09/23 0839 03/10/23 0115 03/11/23 0039 03/12/23 0040  NA 136 134* 134* 136 135 137 136 134*  K 4.2 3.3* 2.9* 2.8* 2.9* 3.6 3.4* 3.4*  CL 103 100 97* 100 98 100 100 101  CO2 23 22 24 25 26 27 28 24   GLUCOSE 149* 157* 161* 109* 166* 129* 117* 123*  BUN 18 22 28* 25* 20 21 18 18   CREATININE 1.38* 1.59* 1.68* 1.37* 1.24* 1.21* 1.13* 1.04*  CALCIUM 8.4* 8.1* 8.4* 8.3* 8.4* 8.5* 8.6* 8.6*  MG 1.8  --  1.8 1.8 1.7  --   --   --   PROT 5.9* 5.4* 5.7*  --   --   --   --   --   ALBUMIN 2.7* 2.3* 2.9*  --   --   --   --   --   AST 42* 35 37  --   --   --   --   --   ALT 21 22 22   --   --   --   --   --  ALKPHOS 71 67 52  --   --   --   --   --   BILITOT 5.0* 4.8* 4.2*  --   --   --   --   --   GFRNONAA 42* 35* 33* 42* 48* 49* 53* 59*  ANIONGAP 10 12 13 11 11 10 8 9     Hematology Recent Labs  Lab 03/07/23 0059 03/08/23 0139 03/10/23 0115  WBC 2.4* 2.5* 3.2*  RBC 2.97* 3.04* 3.26*  HGB 9.8* 10.0* 11.0*  HCT 28.6* 29.3* 31.8*  MCV 96.3 96.4 97.5  MCH 33.0 32.9 33.7  MCHC 34.3 34.1 34.6  RDW 15.4 15.0 15.1  PLT 19* 25* 39*    BNP Recent Labs  Lab 03/05/23 1549  BNP 362.6*     Radiology    No results found.    Patient Profile     67 y.o. female with past medical history of chronic diastolic congestive heart failure, permanent atrial fibrillation, sleep apnea, morbid obesity, cirrhosis complicated by portal hypertension, hypertension, diabetes mellitus being evaluated for atrial fibrillation and acute on chronic diastolic congestive heart failure. Echocardiogram this admission shows normal LV function, severe biatrial enlargement, moderate tricuspid regurgitation and mild to moderate aortic stenosis with mean gradient 15 mmHg.  Assessment & Plan    1 permanent atrial fibrillation-heart rate is now controlled.  Change Cardizem to 180 mg daily.  Continue low-dose metoprolol and digoxin.  Patient is not on  anticoagulation given baseline thrombocytopenia.   2 acute on chronic diastolic congestive heart failure-volume status is much improved.  Continue Demadex 20 mg twice daily.  Given morbid obesity, pannus and active cellulitis, not a good candidate for SGLT2 inhibitor.    3 mild to moderate aortic stenosis-will need follow-up echoes in the future.  4 cellulitis-antibiotics per primary care  5 cirrhosis/thrombocytopenia/diabetes mellitus-Per primary care.  6 hypokalemia- remains at 3.4 with repletion. May need standing dose of potassium at discharge - at least 40 MEQ daily.  Ok to d/c home today from my standpoint.  For questions or updates, please contact [facility] Please consult [email or web address] for contact info under   Chrystie Nose, MD, Milagros Loll  [facility]  Medical Director of the [facility]  [facility] Diplomate of the American Board of Clinical Lipidology Attending Cardiologist  Direct Dial: [phone]  Fax: [phone]  Website:  [email or web address]  Chrystie Nose, MD  03/12/2023, 12:47 PM

## 2023-03-12 NOTE — TOC Transition Note (Signed)
Transition of Care (TOC) - CM/SW Discharge Note Donn Pierini RN, BSN Transitions of Care Unit 4E- RN Case Manager See Treatment Team for direct phone # Weekend coverage  Patient Details  Name: Hannah Khan MRN: [id number] Date of Birth: [birth year]  [facility] Care [facility]) CM/SW Contact:  Darrold Span, RN Phone Number: 03/12/2023, 1:47 PM   Clinical Narrative:    Pt stable for transition home today, per previous CM note-HH has been set up with [facility] and referral accepted. Liaison following for discharge and start of care scheduling.   No further TOC needs noted.    Final next level of care: Home w Home Health Services Barriers to Discharge: Barriers Resolved   Patient Goals and CMS Choice CMS Medicare.gov Compare Post Acute Care list provided to:: Patient Choice offered to / list presented to : Patient  Discharge Placement               Home w/ [facility]          Discharge Plan and Services Additional resources added to the After Visit Summary for   In-house Referral: NA Discharge Planning Services: CM Consult Post Acute Care Choice: Home Health          DME Arranged: N/A DME Agency: NA       HH Arranged: PT HH Agency: [facility] Date [facility] Agency Contacted: 03/12/23   Representative spoke with at [facility] Agency: cory  Social Determinants of Health (SDOH) Interventions SDOH Screenings   Food Insecurity: No Food Insecurity (03/05/2023)  Housing: Low Risk  (03/05/2023)  Transportation Needs: No Transportation Needs (03/05/2023)  Utilities: Not At Risk (03/05/2023)  Tobacco Use: Medium Risk (03/05/2023)     Readmission Risk Interventions    03/12/2023    1:47 PM  Readmission Risk Prevention Plan  Transportation Screening Complete  Home Care Screening Complete  Medication Review (RN CM) Complete

## 2023-03-12 NOTE — Plan of Care (Signed)
  Problem: Clinical Measurements: Goal: Will remain free from infection Outcome: Completed/Met   Problem: Elimination: Goal: Will not experience complications related to bowel motility Outcome: Completed/Met

## 2023-03-14 ENCOUNTER — Ambulatory Visit
Admission: RE | Admit: 2023-03-14 | Discharge: 2023-03-14 | Disposition: A | Discharge status: Home / Self Care | Payer: Medicare PPO | Source: Ambulatory Visit | Attending: Family Medicine | Admitting: Family Medicine

## 2023-03-14 DIAGNOSIS — Z1231 Encounter for screening mammogram for malignant neoplasm of breast: Secondary | ICD-10-CM | POA: Diagnosis not present

## 2023-03-14 DIAGNOSIS — Z853 Personal history of malignant neoplasm of breast: Secondary | ICD-10-CM

## 2023-03-19 DIAGNOSIS — I5033 Acute on chronic diastolic (congestive) heart failure: Secondary | ICD-10-CM | POA: Diagnosis not present

## 2023-03-19 DIAGNOSIS — M170 Bilateral primary osteoarthritis of knee: Secondary | ICD-10-CM | POA: Diagnosis not present

## 2023-03-19 DIAGNOSIS — G4733 Obstructive sleep apnea (adult) (pediatric): Secondary | ICD-10-CM | POA: Diagnosis not present

## 2023-03-19 DIAGNOSIS — E119 Type 2 diabetes mellitus without complications: Secondary | ICD-10-CM | POA: Diagnosis not present

## 2023-03-19 DIAGNOSIS — A419 Sepsis, unspecified organism: Secondary | ICD-10-CM | POA: Diagnosis not present

## 2023-03-19 DIAGNOSIS — L03311 Cellulitis of abdominal wall: Secondary | ICD-10-CM | POA: Diagnosis not present

## 2023-03-19 DIAGNOSIS — I110 Hypertensive heart disease with heart failure: Secondary | ICD-10-CM | POA: Diagnosis not present

## 2023-03-19 DIAGNOSIS — I4891 Unspecified atrial fibrillation: Secondary | ICD-10-CM | POA: Diagnosis not present

## 2023-03-19 DIAGNOSIS — K7581 Nonalcoholic steatohepatitis (NASH): Secondary | ICD-10-CM | POA: Diagnosis not present

## 2023-03-21 DIAGNOSIS — K766 Portal hypertension: Secondary | ICD-10-CM | POA: Diagnosis not present

## 2023-03-21 DIAGNOSIS — I8500 Esophageal varices without bleeding: Secondary | ICD-10-CM | POA: Diagnosis not present

## 2023-03-21 DIAGNOSIS — I480 Paroxysmal atrial fibrillation: Secondary | ICD-10-CM | POA: Diagnosis not present

## 2023-03-21 DIAGNOSIS — Z6841 Body Mass Index (BMI) 40.0 and over, adult: Secondary | ICD-10-CM | POA: Diagnosis not present

## 2023-03-21 DIAGNOSIS — L0390 Cellulitis, unspecified: Secondary | ICD-10-CM | POA: Diagnosis not present

## 2023-03-21 DIAGNOSIS — I5032 Chronic diastolic (congestive) heart failure: Secondary | ICD-10-CM | POA: Diagnosis not present

## 2023-03-21 DIAGNOSIS — I959 Hypotension, unspecified: Secondary | ICD-10-CM | POA: Diagnosis not present

## 2023-03-21 DIAGNOSIS — K7460 Unspecified cirrhosis of liver: Secondary | ICD-10-CM | POA: Diagnosis not present

## 2023-03-30 DIAGNOSIS — H43813 Vitreous degeneration, bilateral: Secondary | ICD-10-CM | POA: Diagnosis not present

## 2023-03-30 DIAGNOSIS — H35033 Hypertensive retinopathy, bilateral: Secondary | ICD-10-CM | POA: Diagnosis not present

## 2023-03-30 DIAGNOSIS — H353221 Exudative age-related macular degeneration, left eye, with active choroidal neovascularization: Secondary | ICD-10-CM | POA: Diagnosis not present

## 2023-03-30 DIAGNOSIS — H353112 Nonexudative age-related macular degeneration, right eye, intermediate dry stage: Secondary | ICD-10-CM | POA: Diagnosis not present

## 2023-03-30 DIAGNOSIS — E113293 Type 2 diabetes mellitus with mild nonproliferative diabetic retinopathy without macular edema, bilateral: Secondary | ICD-10-CM | POA: Diagnosis not present

## 2023-04-03 ENCOUNTER — Telehealth: Payer: Self-pay | Admitting: Cardiovascular Disease

## 2023-04-03 MED ORDER — DIGOXIN 250 MCG PO TABS: 0.2500 mg | ORAL_TABLET | Freq: Every day | ORAL | 3 refills | Status: DC

## 2023-04-03 MED ORDER — METOPROLOL TARTRATE 25 MG PO TABS: 25.0000 mg | ORAL_TABLET | Freq: Two times a day (BID) | ORAL | 3 refills | Status: DC

## 2023-04-03 MED ORDER — MIDODRINE HCL 10 MG PO TABS: 10.0000 mg | ORAL_TABLET | Freq: Two times a day (BID) | ORAL | 3 refills | Status: DC

## 2023-04-03 NOTE — Telephone Encounter (Signed)
Pt c/o medication issue:  1. Name of Medication:  metoprolol tartrate (LOPRESSOR) 25 MG tablet  digoxin (LANOXIN) 0.25 MG tablet  midodrine (PROAMATINE) 10 MG tablet   torsemide (DEMADEX) 20 MG tablet   2. How are you currently taking this medication (dosage and times per day)? As prescribed   3. Are you having a reaction (difficulty breathing--STAT)?   4. What is your medication issue? Patient needs this continued and called in after hospital prescribed.  Send to:    [id number] - [place], [place] - [address]

## 2023-04-04 DIAGNOSIS — I5033 Acute on chronic diastolic (congestive) heart failure: Secondary | ICD-10-CM | POA: Diagnosis not present

## 2023-04-04 DIAGNOSIS — G4733 Obstructive sleep apnea (adult) (pediatric): Secondary | ICD-10-CM | POA: Diagnosis not present

## 2023-04-04 DIAGNOSIS — M170 Bilateral primary osteoarthritis of knee: Secondary | ICD-10-CM | POA: Diagnosis not present

## 2023-04-04 DIAGNOSIS — K7581 Nonalcoholic steatohepatitis (NASH): Secondary | ICD-10-CM | POA: Diagnosis not present

## 2023-04-04 DIAGNOSIS — I4891 Unspecified atrial fibrillation: Secondary | ICD-10-CM | POA: Diagnosis not present

## 2023-04-04 DIAGNOSIS — E119 Type 2 diabetes mellitus without complications: Secondary | ICD-10-CM | POA: Diagnosis not present

## 2023-04-04 DIAGNOSIS — L03311 Cellulitis of abdominal wall: Secondary | ICD-10-CM | POA: Diagnosis not present

## 2023-04-04 DIAGNOSIS — A419 Sepsis, unspecified organism: Secondary | ICD-10-CM | POA: Diagnosis not present

## 2023-04-04 DIAGNOSIS — I110 Hypertensive heart disease with heart failure: Secondary | ICD-10-CM | POA: Diagnosis not present

## 2023-04-05 DIAGNOSIS — L03311 Cellulitis of abdominal wall: Secondary | ICD-10-CM | POA: Diagnosis not present

## 2023-04-05 DIAGNOSIS — L0390 Cellulitis, unspecified: Secondary | ICD-10-CM | POA: Diagnosis not present

## 2023-04-06 ENCOUNTER — Emergency Department (HOSPITAL_BASED_OUTPATIENT_CLINIC_OR_DEPARTMENT_OTHER): Payer: Medicare PPO

## 2023-04-06 ENCOUNTER — Encounter (HOSPITAL_BASED_OUTPATIENT_CLINIC_OR_DEPARTMENT_OTHER): Payer: Self-pay | Admitting: Emergency Medicine

## 2023-04-06 ENCOUNTER — Other Ambulatory Visit: Payer: Self-pay

## 2023-04-06 ENCOUNTER — Inpatient Hospital Stay (HOSPITAL_BASED_OUTPATIENT_CLINIC_OR_DEPARTMENT_OTHER)
Admission: EM | Admit: 2023-04-06 | Discharge: 2023-04-08 | DRG: 392 | Disposition: A | Discharge status: Home / Self Care | Payer: Medicare PPO | Attending: Family Medicine | Admitting: Family Medicine

## 2023-04-06 DIAGNOSIS — I4819 Other persistent atrial fibrillation: Secondary | ICD-10-CM | POA: Diagnosis present

## 2023-04-06 DIAGNOSIS — Z801 Family history of malignant neoplasm of trachea, bronchus and lung: Secondary | ICD-10-CM

## 2023-04-06 DIAGNOSIS — Z7985 Long-term (current) use of injectable non-insulin antidiabetic drugs: Secondary | ICD-10-CM

## 2023-04-06 DIAGNOSIS — K7581 Nonalcoholic steatohepatitis (NASH): Secondary | ICD-10-CM | POA: Diagnosis present

## 2023-04-06 DIAGNOSIS — Z87891 Personal history of nicotine dependence: Secondary | ICD-10-CM

## 2023-04-06 DIAGNOSIS — Z888 Allergy status to other drugs, medicaments and biological substances status: Secondary | ICD-10-CM | POA: Diagnosis not present

## 2023-04-06 DIAGNOSIS — G4730 Sleep apnea, unspecified: Secondary | ICD-10-CM | POA: Diagnosis present

## 2023-04-06 DIAGNOSIS — E785 Hyperlipidemia, unspecified: Secondary | ICD-10-CM | POA: Diagnosis present

## 2023-04-06 DIAGNOSIS — Z6841 Body Mass Index (BMI) 40.0 and over, adult: Secondary | ICD-10-CM

## 2023-04-06 DIAGNOSIS — Z79899 Other long term (current) drug therapy: Secondary | ICD-10-CM

## 2023-04-06 DIAGNOSIS — K7460 Unspecified cirrhosis of liver: Secondary | ICD-10-CM | POA: Diagnosis present

## 2023-04-06 DIAGNOSIS — I482 Chronic atrial fibrillation, unspecified: Secondary | ICD-10-CM | POA: Diagnosis not present

## 2023-04-06 DIAGNOSIS — I130 Hypertensive heart and chronic kidney disease with heart failure and stage 1 through stage 4 chronic kidney disease, or unspecified chronic kidney disease: Secondary | ICD-10-CM | POA: Diagnosis present

## 2023-04-06 DIAGNOSIS — K766 Portal hypertension: Secondary | ICD-10-CM | POA: Diagnosis present

## 2023-04-06 DIAGNOSIS — R1084 Generalized abdominal pain: Principal | ICD-10-CM

## 2023-04-06 DIAGNOSIS — Z809 Family history of malignant neoplasm, unspecified: Secondary | ICD-10-CM

## 2023-04-06 DIAGNOSIS — Z8249 Family history of ischemic heart disease and other diseases of the circulatory system: Secondary | ICD-10-CM | POA: Diagnosis not present

## 2023-04-06 DIAGNOSIS — R112 Nausea with vomiting, unspecified: Secondary | ICD-10-CM | POA: Diagnosis not present

## 2023-04-06 DIAGNOSIS — N1831 Chronic kidney disease, stage 3a: Secondary | ICD-10-CM | POA: Diagnosis present

## 2023-04-06 DIAGNOSIS — A084 Viral intestinal infection, unspecified: Secondary | ICD-10-CM | POA: Diagnosis present

## 2023-04-06 DIAGNOSIS — Z83438 Family history of other disorder of lipoprotein metabolism and other lipidemia: Secondary | ICD-10-CM

## 2023-04-06 DIAGNOSIS — M797 Fibromyalgia: Secondary | ICD-10-CM | POA: Diagnosis present

## 2023-04-06 DIAGNOSIS — R9431 Abnormal electrocardiogram [ECG] [EKG]: Secondary | ICD-10-CM | POA: Diagnosis not present

## 2023-04-06 DIAGNOSIS — E119 Type 2 diabetes mellitus without complications: Secondary | ICD-10-CM

## 2023-04-06 DIAGNOSIS — Z91013 Allergy to seafood: Secondary | ICD-10-CM | POA: Diagnosis not present

## 2023-04-06 DIAGNOSIS — R161 Splenomegaly, not elsewhere classified: Secondary | ICD-10-CM | POA: Diagnosis not present

## 2023-04-06 DIAGNOSIS — E876 Hypokalemia: Secondary | ICD-10-CM | POA: Diagnosis present

## 2023-04-06 DIAGNOSIS — I5032 Chronic diastolic (congestive) heart failure: Secondary | ICD-10-CM | POA: Diagnosis present

## 2023-04-06 DIAGNOSIS — Z885 Allergy status to narcotic agent status: Secondary | ICD-10-CM | POA: Diagnosis not present

## 2023-04-06 DIAGNOSIS — Z9049 Acquired absence of other specified parts of digestive tract: Secondary | ICD-10-CM

## 2023-04-06 DIAGNOSIS — L03311 Cellulitis of abdominal wall: Secondary | ICD-10-CM | POA: Diagnosis present

## 2023-04-06 DIAGNOSIS — D61818 Other pancytopenia: Secondary | ICD-10-CM | POA: Diagnosis present

## 2023-04-06 DIAGNOSIS — E1122 Type 2 diabetes mellitus with diabetic chronic kidney disease: Secondary | ICD-10-CM | POA: Diagnosis present

## 2023-04-06 DIAGNOSIS — R0989 Other specified symptoms and signs involving the circulatory and respiratory systems: Secondary | ICD-10-CM | POA: Diagnosis not present

## 2023-04-06 DIAGNOSIS — Z823 Family history of stroke: Secondary | ICD-10-CM

## 2023-04-06 DIAGNOSIS — Z886 Allergy status to analgesic agent status: Secondary | ICD-10-CM | POA: Diagnosis not present

## 2023-04-06 DIAGNOSIS — R410 Disorientation, unspecified: Secondary | ICD-10-CM | POA: Diagnosis not present

## 2023-04-06 LAB — GLUCOSE, CAPILLARY
Glucose-Capillary: 188 mg/dL — ABNORMAL HIGH (ref 70–99)
Glucose-Capillary: 94 mg/dL (ref 70–99)
Glucose-Capillary: 99 mg/dL (ref 70–99)

## 2023-04-06 LAB — CBC WITH DIFFERENTIAL/PLATELET
Abs Immature Granulocytes: 0.02 10*3/uL (ref 0.00–0.07)
Basophils Absolute: 0 10*3/uL (ref 0.0–0.1)
Basophils Relative: 1 %
Eosinophils Absolute: 0 10*3/uL (ref 0.0–0.5)
Eosinophils Relative: 0 %
HCT: 33.3 % — ABNORMAL LOW (ref 36.0–46.0)
Hemoglobin: 11.5 g/dL — ABNORMAL LOW (ref 12.0–15.0)
Immature Granulocytes: 0 %
Lymphocytes Relative: 4 %
Lymphs Abs: 0.2 10*3/uL — ABNORMAL LOW (ref 0.7–4.0)
MCH: 33.4 pg (ref 26.0–34.0)
MCHC: 34.5 g/dL (ref 30.0–36.0)
MCV: 96.8 fL (ref 80.0–100.0)
Monocytes Absolute: 0.4 10*3/uL (ref 0.1–1.0)
Monocytes Relative: 7 %
Neutro Abs: 5.6 10*3/uL (ref 1.7–7.7)
Neutrophils Relative %: 88 %
Platelets: 23 10*3/uL — CL (ref 150–400)
RBC: 3.44 MIL/uL — ABNORMAL LOW (ref 3.87–5.11)
RDW: 16.2 % — ABNORMAL HIGH (ref 11.5–15.5)
WBC: 6.2 10*3/uL (ref 4.0–10.5)
nRBC: 0 % (ref 0.0–0.2)

## 2023-04-06 LAB — COMPREHENSIVE METABOLIC PANEL
ALT: 26 U/L (ref 0–44)
AST: 53 U/L — ABNORMAL HIGH (ref 15–41)
Albumin: 2.8 g/dL — ABNORMAL LOW (ref 3.5–5.0)
Alkaline Phosphatase: 87 U/L (ref 38–126)
Anion gap: 13 (ref 5–15)
BUN: 21 mg/dL (ref 8–23)
CO2: 26 mmol/L (ref 22–32)
Calcium: 8.8 mg/dL — ABNORMAL LOW (ref 8.9–10.3)
Chloride: 96 mmol/L — ABNORMAL LOW (ref 98–111)
Creatinine, Ser: 1.32 mg/dL — ABNORMAL HIGH (ref 0.44–1.00)
GFR, Estimated: 44 mL/min — ABNORMAL LOW (ref >60)
Glucose, Bld: 163 mg/dL — ABNORMAL HIGH (ref 70–99)
Potassium: 2.7 mmol/L — CL (ref 3.5–5.1)
Sodium: 135 mmol/L (ref 135–145)
Total Bilirubin: 6.4 mg/dL — ABNORMAL HIGH (ref 0.3–1.2)
Total Protein: 6.2 g/dL — ABNORMAL LOW (ref 6.5–8.1)

## 2023-04-06 LAB — URINALYSIS, ROUTINE W REFLEX MICROSCOPIC
Bilirubin Urine: NEGATIVE
Glucose, UA: NEGATIVE mg/dL
Hgb urine dipstick: NEGATIVE
Ketones, ur: NEGATIVE mg/dL
Leukocytes,Ua: NEGATIVE
Nitrite: NEGATIVE
Protein, ur: NEGATIVE mg/dL
Specific Gravity, Urine: 1.034 — ABNORMAL HIGH (ref 1.005–1.030)
pH: 5 (ref 5.0–8.0)

## 2023-04-06 LAB — MAGNESIUM: Magnesium: 1.7 mg/dL (ref 1.7–2.4)

## 2023-04-06 LAB — LIPASE, BLOOD: Lipase: 30 U/L (ref 11–51)

## 2023-04-06 MED ORDER — TORSEMIDE 20 MG PO TABS
20.0000 mg | ORAL_TABLET | Freq: Two times a day (BID) | ORAL | Status: DC
  Administered 2023-04-06 – 2023-04-08 (×4): 20 mg via ORAL
  Filled 2023-04-06 (×6): qty 1

## 2023-04-06 MED ORDER — DICYCLOMINE HCL 10 MG PO CAPS: 10.0000 mg | ORAL_CAPSULE | Freq: Once | ORAL | Status: DC

## 2023-04-06 MED ORDER — ONDANSETRON HCL 4 MG PO TABS: 4.0000 mg | ORAL_TABLET | Freq: Four times a day (QID) | ORAL | Status: DC | PRN

## 2023-04-06 MED ORDER — DIGOXIN 250 MCG PO TABS
0.2500 mg | ORAL_TABLET | Freq: Every day | ORAL | Status: DC
  Administered 2023-04-06 – 2023-04-08 (×3): 0.25 mg via ORAL
  Filled 2023-04-06 (×4): qty 1

## 2023-04-06 MED ORDER — IOHEXOL 300 MG/ML  SOLN
100.0000 mL | Freq: Once | INTRAMUSCULAR | Status: AC | PRN
  Administered 2023-04-06: 100 mL via INTRAVENOUS

## 2023-04-06 MED ORDER — ONDANSETRON HCL 4 MG/2ML IJ SOLN
4.0000 mg | Freq: Once | INTRAMUSCULAR | Status: AC
  Administered 2023-04-06: 4 mg via INTRAVENOUS
  Filled 2023-04-06: qty 2

## 2023-04-06 MED ORDER — DILTIAZEM HCL ER COATED BEADS 180 MG PO CP24
180.0000 mg | ORAL_CAPSULE | Freq: Every day | ORAL | Status: DC
  Administered 2023-04-06 – 2023-04-08 (×3): 180 mg via ORAL
  Filled 2023-04-06 (×4): qty 1

## 2023-04-06 MED ORDER — SODIUM CHLORIDE 0.9 % IV SOLN
2.0000 g | INTRAVENOUS | Status: DC
  Administered 2023-04-06 – 2023-04-07 (×2): 2 g via INTRAVENOUS
  Filled 2023-04-06 (×2): qty 20

## 2023-04-06 MED ORDER — INSULIN ASPART 100 UNIT/ML IJ SOLN
0.0000 [IU] | Freq: Three times a day (TID) | INTRAMUSCULAR | Status: DC
  Administered 2023-04-06 – 2023-04-07 (×2): 3 [IU] via SUBCUTANEOUS
  Administered 2023-04-08: 2 [IU] via SUBCUTANEOUS

## 2023-04-06 MED ORDER — SODIUM CHLORIDE 0.9 % IV BOLUS
1000.0000 mL | Freq: Once | INTRAVENOUS | Status: AC
  Administered 2023-04-06: 1000 mL via INTRAVENOUS

## 2023-04-06 MED ORDER — TRAZODONE HCL 50 MG PO TABS: 25.0000 mg | ORAL_TABLET | Freq: Every evening | ORAL | Status: DC | PRN

## 2023-04-06 MED ORDER — MORPHINE SULFATE (PF) 4 MG/ML IV SOLN
4.0000 mg | Freq: Once | INTRAVENOUS | Status: DC
  Filled 2023-04-06: qty 1

## 2023-04-06 MED ORDER — DICYCLOMINE HCL 10 MG/ML IM SOLN
20.0000 mg | Freq: Once | INTRAMUSCULAR | Status: AC
  Administered 2023-04-06: 20 mg via INTRAMUSCULAR
  Filled 2023-04-06: qty 2

## 2023-04-06 MED ORDER — DIPHENHYDRAMINE HCL 50 MG/ML IJ SOLN
25.0000 mg | Freq: Once | INTRAMUSCULAR | Status: AC
  Administered 2023-04-06: 25 mg via INTRAVENOUS
  Filled 2023-04-06: qty 1

## 2023-04-06 MED ORDER — OXYCODONE HCL 5 MG PO TABS: 5.0000 mg | ORAL_TABLET | ORAL | Status: DC | PRN

## 2023-04-06 MED ORDER — ONDANSETRON HCL 4 MG/2ML IJ SOLN: 4.0000 mg | Freq: Four times a day (QID) | INTRAMUSCULAR | Status: DC | PRN

## 2023-04-06 MED ORDER — ALUM & MAG HYDROXIDE-SIMETH 200-200-20 MG/5ML PO SUSP: 30.0000 mL | Freq: Once | ORAL | Status: DC

## 2023-04-06 MED ORDER — METOPROLOL TARTRATE 25 MG PO TABS: 25.0000 mg | ORAL_TABLET | Freq: Once | ORAL | Status: DC

## 2023-04-06 MED ORDER — METOPROLOL TARTRATE 25 MG PO TABS
25.0000 mg | ORAL_TABLET | Freq: Two times a day (BID) | ORAL | Status: DC
  Administered 2023-04-06 – 2023-04-08 (×4): 25 mg via ORAL
  Filled 2023-04-06 (×5): qty 1

## 2023-04-06 MED ORDER — POTASSIUM CHLORIDE 10 MEQ/100ML IV SOLN
10.0000 meq | INTRAVENOUS | Status: AC
  Administered 2023-04-06 (×3): 10 meq via INTRAVENOUS
  Filled 2023-04-06 (×4): qty 100

## 2023-04-06 MED ORDER — INSULIN ASPART 100 UNIT/ML IJ SOLN: 0.0000 [IU] | Freq: Every day | INTRAMUSCULAR | Status: DC

## 2023-04-06 MED ORDER — ALBUTEROL SULFATE (2.5 MG/3ML) 0.083% IN NEBU: 2.5000 mg | INHALATION_SOLUTION | RESPIRATORY_TRACT | Status: DC | PRN

## 2023-04-06 MED ORDER — SODIUM CHLORIDE 0.9 % IV BOLUS: 1000.0000 mL | Freq: Once | INTRAVENOUS | Status: DC

## 2023-04-06 MED ORDER — LIDOCAINE VISCOUS HCL 2 % MT SOLN: 15.0000 mL | Freq: Once | OROMUCOSAL | Status: DC

## 2023-04-06 MED ORDER — MIDODRINE HCL 5 MG PO TABS
10.0000 mg | ORAL_TABLET | Freq: Two times a day (BID) | ORAL | Status: DC
  Administered 2023-04-06 – 2023-04-08 (×4): 10 mg via ORAL
  Filled 2023-04-06 (×4): qty 2

## 2023-04-06 MED ORDER — SODIUM CHLORIDE 0.9 % IV SOLN: Freq: Once | INTRAVENOUS | Status: AC

## 2023-04-06 MED ORDER — METOCLOPRAMIDE HCL 5 MG/ML IJ SOLN
5.0000 mg | Freq: Once | INTRAMUSCULAR | Status: AC
  Administered 2023-04-06: 5 mg via INTRAVENOUS
  Filled 2023-04-06: qty 2

## 2023-04-06 MED ORDER — RIFAXIMIN 550 MG PO TABS
550.0000 mg | ORAL_TABLET | Freq: Two times a day (BID) | ORAL | Status: DC
  Administered 2023-04-06 – 2023-04-08 (×5): 550 mg via ORAL
  Filled 2023-04-06 (×6): qty 1

## 2023-04-06 MED ORDER — POTASSIUM CHLORIDE 10 MEQ/100ML IV SOLN
10.0000 meq | INTRAVENOUS | Status: AC
  Administered 2023-04-06 (×2): 10 meq via INTRAVENOUS
  Filled 2023-04-06 (×2): qty 100

## 2023-04-06 MED ORDER — FAMOTIDINE IN NACL 20-0.9 MG/50ML-% IV SOLN
20.0000 mg | Freq: Once | INTRAVENOUS | Status: AC
  Administered 2023-04-06: 20 mg via INTRAVENOUS
  Filled 2023-04-06: qty 50

## 2023-04-06 MED ORDER — LACTULOSE 10 GM/15ML PO SOLN
10.0000 g | Freq: Three times a day (TID) | ORAL | Status: DC
  Administered 2023-04-06 – 2023-04-08 (×7): 10 g via ORAL
  Filled 2023-04-06 (×7): qty 15

## 2023-04-06 NOTE — ED Notes (Signed)
Spoke with Maisie Fus at [facility] regarding ready bed, will send transport

## 2023-04-06 NOTE — ED Notes (Signed)
Pt desaturated on the monitor to 79%, went into room and woke patient up. She was lateral L recumbent and trying to sleep. Pt advised she never sleeps without CPAP at home, and does not currently have it. With arousal, pt improved quickly back to 99% on RA. No O2 given. RT Brett Canales notified. Pt CAOx4 with NAD.

## 2023-04-06 NOTE — ED Triage Notes (Signed)
  Patient BIB EMS for 3 day hx of N/V, and dry heaving.  Patient states she was seen at [facility] clinic and given prescription for Zofran but states she did not take it based on medical hx.  Patient states she has not been able to take her home lactulose, and cardiac meds.  No specific pain, just states she doesn't feel good.

## 2023-04-06 NOTE — Discharge Instructions (Addendum)
It was a pleasure caring for you today in the emergency department. ° °Please return to the emergency department for any worsening or worrisome symptoms. ° ° °

## 2023-04-06 NOTE — ED Notes (Signed)
[facility] in to transport to [facility]. Nurse unable to take report at this time and she will call me back

## 2023-04-06 NOTE — ED Provider Notes (Signed)
Patient signed out to me awaiting p.o. challenge.  She is here with nausea vomiting dry heaving for the last few days.  Potassium 2.7 and being repleted.  She has Elita Boone with cirrhotic changes.  She has chronic low blood platelets which are 23 today.  No leukocytosis.  No significant AKI.  CT scan with overall no major acute findings.  Started on Plavix currently for abdominal cellulitis but overall that appears well.  She still having pain and nausea vomiting.  Was able to tolerate a little bit ice chips but not a ton.  Ultimately I think she would benefit from observation stay as if she.  Will admit to medicine for further symptomatic care.  This chart was dictated using voice recognition software.  Despite best efforts to proofread,  errors can occur which can change the documentation meaning.    Virgina Norfolk, DO 04/06/23 [phone]

## 2023-04-06 NOTE — ED Provider Notes (Signed)
[facility]  [facility] Provider Note  CSN: [id number] Arrival date & time: 04/06/23 7829  Chief Complaint(s) Emesis and Nausea  HPI Hannah Khan is a 67 y.o. female with past medical history as below, significant for CHF, DM 2, fibromyalgia, gastric varices, HLD, HTN, liver cirrhosis, NASH, A-fib, morbid obesity who presents to the ED with complaint of nausea vomiting.  Symptom onset over the past 2 to 3 days, nausea and vomiting, diffuse abdominal pain.  Difficulty taking her daily medications secondary to nausea and vomiting.  No fevers or chills.  Reduced urine output.  No BRBPR or melena.  No yellowing to skin or confusion reported by family.  No recent travel or sick contacts, no speech p.o. intake.  Dyspnea or dyspnea, no reported palpitations  She is being treated for abdominal wall cellulitis, has missed her last couple days of antibiotics due to her nausea and vomiting  Past Medical History Past Medical History:  Diagnosis Date   Anemia    hx of   Arthritis    knees   CHF (congestive heart failure) (HCC)    chronic diastolic   Diabetes mellitus without complication (HCC)    type 2   Dyspnea    Dysrhythmia    Fibromyalgia    Gastric varices    large   H/O transfusion of platelets    Heart murmur    never has caused any problems   Hx of colonic polyps    s/p partial colectomy   Hyperlipidemia    Hypertension    Joint pain    in knees and back spasms   Left leg swelling    wear compression hose   Liver cirrhosis secondary to NASH (nonalcoholic steatohepatitis) (HCC) dx nov 2014   had enlarged spleen also    Morbid obesity (HCC)    Persistent atrial fibrillation (HCC)    Pneumonia 10/2016   x 5   PONV (postoperative nausea and vomiting)    in past none recent   Portal hypertension (HCC)    Sleep apnea    uses cpap, setting varies between 14-20   Spleen enlarged    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 [facility])    Patient Active  Problem List   Diagnosis Date Noted   Intractable nausea and vomiting 04/07/2023   Intractable vomiting with nausea 04/06/2023   Acute diastolic congestive heart failure (HCC) 03/06/2023   Idiopathic hypotension 03/06/2023   Sepsis due to cellulitis (HCC) 03/05/2023   Severe sepsis (HCC) 03/05/2023   Portal hypertension (HCC) 12/23/2022   Acute encephalopathy 01/10/2021   Secondary hypercoagulable state (HCC) 06/23/2020   Streptococcal bacteremia 03/16/2020   Chronic venous stasis dermatitis of both lower extremities 03/16/2020   Atrial fibrillation with RVR (HCC) 04/26/2018   Acute lower UTI 04/26/2018   Liver cirrhosis secondary to NASH (nonalcoholic steatohepatitis) (HCC) 04/26/2018   Hyperlipidemia 04/26/2018   Type 2 diabetes mellitus with other specified complication (HCC)    Cellulitis of right lower extremity 03/15/2018   Sepsis (HCC) 03/14/2018   Community acquired pneumonia of right lower lobe of lung 10/31/2016   Muscular abdominal pain in right upper quadrant 07/11/2016   Chronic diastolic heart failure (HCC) 07/10/2016   Thrombocytopenia (HCC) 07/10/2016   Permanent atrial fibrillation (HCC) 01/30/2016   Type 2 diabetes mellitus without complication, without long-term current use of insulin (HCC) 01/30/2016   Morbid obesity- 12/10/2015   Cellulitis, abdominal wall    Melena 01/31/2015   Aortic heart murmur 01/31/2015  Pancytopenia (HCC) 07/23/2013   Obstructive sleep apnea 08/07/2007   Essential hypertension 08/07/2007   ALLERGIC RHINITIS 08/07/2007   Home Medication(s) Prior to Admission medications   Medication Sig Start Date End Date Taking? Authorizing Provider  albuterol (VENTOLIN HFA) 108 (90 Base) MCG/ACT inhaler Inhale 1-2 puffs into the lungs every 6 (six) hours as needed for wheezing or shortness of breath.   Yes [provider]  cefadroxil (DURICEF) 1 g tablet Take 1 tablet (1 g total) by mouth 2 (two) times daily for 5 days. 04/08/23 04/13/23  Yes Standley Brooking, MD  diclofenac Sodium (VOLTAREN) 1 % GEL Apply 1 Application topically at bedtime as needed (pain).   Yes [provider]  digoxin (LANOXIN) 0.25 MG tablet Take 1 tablet (0.25 mg total) by mouth daily. 04/03/23  Yes Chilton Si, MD  diltiazem (CARDIZEM CD) 180 MG 24 hr capsule TAKE 1 CAPSULE BY MOUTH EVERY DAY Patient taking differently: Take 180 mg by mouth every evening. 10/20/22  Yes Alver Sorrow, NP  lactulose (CHRONULAC) 10 GM/15ML solution Take 10 g by mouth 3 (three) times daily. 03/17/21  Yes [provider]  metolazone (ZAROXOLYN) 5 MG tablet Take 1 tablet (5 mg total) by mouth once a week. Take one tablet on Sundays 30 minutes before Bumex 02/28/23  Yes Alver Sorrow, NP  metoprolol tartrate (LOPRESSOR) 25 MG tablet Take 1 tablet (25 mg total) by mouth 2 (two) times daily. 04/03/23  Yes Chilton Si, MD  midodrine (PROAMATINE) 10 MG tablet Take 1 tablet (10 mg total) by mouth 2 (two) times daily with a meal. 04/03/23  Yes Chilton Si, MD  Multiple Vitamins-Minerals (PRESERVISION AREDS 2) CAPS Take 1 capsule by mouth 2 (two) times daily.   Yes [provider]  potassium chloride (MICRO-K) 10 MEQ CR capsule Take 40 mEq ( 4 tablets)  by mouth twice per day 03/12/23  Yes Zannie Cove, MD  Propylene Glycol (SYSTANE BALANCE) 0.6 % SOLN Place 1 drop into both eyes 2 (two) times daily.   Yes [provider]  Semaglutide, 2 MG/DOSE, (OZEMPIC, 2 MG/DOSE,) 8 MG/3ML SOPN Inject 2 mg into the skin once a week. Patient taking differently: Inject 2 mg into the skin every Thursday. 11/03/22  Yes Alver Sorrow, NP  torsemide (DEMADEX) 20 MG tablet Take 1 tablet (20 mg total) by mouth 2 (two) times daily. 03/12/23  Yes Zannie Cove, MD  XIFAXAN 550 MG TABS tablet Take 550 mg by mouth 2 (two) times daily. 03/13/21  Yes [provider]  Lancets (ONETOUCH DELICA PLUS LANCET33G) MISC Apply topically 3 (three) times  daily. 04/10/20   [provider]  [facility] VERIO test strip 1 each 3 (three) times daily. 04/13/20   [provider]  Past Surgical History Past Surgical History:  Procedure Laterality Date   BREAST LUMPECTOMY WITH RADIOACTIVE SEED LOCALIZATION Right 08/29/2017   Procedure: RIGHT BREAST LUMPECTOMY WITH RADIOACTIVE SEEDS LOCALIZATION;  Surgeon: Harriette Bouillon, MD;  Location: [facility] OR;  Service: General;  Laterality: Right;   CESAREAN SECTION  1989   CHOLECYSTECTOMY  20 yrs ago   COLECTOMY  2011   COLONOSCOPY     COLONOSCOPY N/A 09/05/2018   Procedure: COLONOSCOPY;  Surgeon: Willis Modena, MD;  Location: [facility];  Service: Endoscopy;  Laterality: N/A;   COLONOSCOPY WITH PROPOFOL Bilateral 05/04/2022   Procedure: COLONOSCOPY WITH PROPOFOL;  Surgeon: Willis Modena, MD;  Location: [facility];  Service: Gastroenterology;  Laterality: Bilateral;   DILATATION & CURETTAGE/HYSTEROSCOPY WITH MYOSURE N/A 05/06/2016   Procedure: DILATATION & CURETTAGE/HYSTEROSCOPY WITH MYOSURE WITH POLYPECTOMY;  Surgeon: Myna Hidalgo, DO;  Location: [facility] ORS;  Service: Gynecology;  Laterality: N/A;   ESOPHAGOGASTRODUODENOSCOPY (EGD) WITH PROPOFOL N/A 09/04/2013   Procedure: ESOPHAGOGASTRODUODENOSCOPY (EGD) WITH PROPOFOL;  Surgeon: Willis Modena, MD;  Location: [facility];  Service: Endoscopy;  Laterality: N/A;   ESOPHAGOGASTRODUODENOSCOPY (EGD) WITH PROPOFOL Bilateral 05/04/2022   Procedure: ESOPHAGOGASTRODUODENOSCOPY (EGD) WITH PROPOFOL;  Surgeon: Willis Modena, MD;  Location: [facility];  Service: Gastroenterology;  Laterality: Bilateral;   IR INTRAVASCULAR ULTRASOUND NON CORONARY  12/23/2022   IR RADIOLOGIST EVAL & MGMT  09/21/2022   IR RADIOLOGIST EVAL & MGMT  10/06/2022   IR RADIOLOGIST EVAL & MGMT  11/02/2022   IR RADIOLOGIST EVAL & MGMT  01/31/2023   IR TIPS   12/23/2022   IR US GUIDE VASC ACCESS RIGHT  12/23/2022   KNEE ARTHROSCOPY  yrs ago   bilateral, one done x 1, one done twice   POLYPECTOMY  09/05/2018   Procedure: POLYPECTOMY;  Surgeon: Willis Modena, MD;  Location: [facility];  Service: Endoscopy;;   RADIOLOGY WITH ANESTHESIA N/A 12/23/2022   Procedure: TIPS;  Surgeon: Bennie Dallas, MD;  Location: [facility] OR;  Service: Radiology;  Laterality: N/A;   Family History Family History  Problem Relation Age of Onset   Heart failure Mother    Cancer Mother    Hyperlipidemia Mother    Congestive Heart Failure Mother    Stroke Mother    Lung cancer Father    Cancer Father    Cancer Brother    Hyperlipidemia Brother    Stroke Other    Breast cancer Neg Hx     Social History Social History   Tobacco Use   Smoking status: Former    Current packs/day: 0.00    Average packs/day: 1 pack/day for 3.0 years (3.0 ttl pk-yrs)    Types: Cigarettes    Start date: 09/20/1971    Quit date: 09/19/1974    Years since quitting: 48.5   Smokeless tobacco: Never  Vaping Use   Vaping status: Never Used  Substance Use Topics   Alcohol use: Not Currently    Comment: occ   Drug use: No   Allergies Celecoxib, Shellfish allergy, Barium-containing compounds, Prednisone, Statins, Fentanyl, Ibuprofen, Sglt2 inhibitors, Tramadol, Tylenol [acetaminophen], and Zetia [ezetimibe]  Review of Systems Review of Systems  Constitutional:  Negative for chills and fever.  Respiratory:  Negative for chest tightness and shortness of breath.   Cardiovascular:  Negative for chest pain and palpitations.  Gastrointestinal:  Positive for abdominal pain, nausea and vomiting.  Genitourinary:  Positive for decreased urine volume. Negative for dysuria and urgency.  Musculoskeletal:  Negative for myalgias.  Skin:  Positive for rash.  All  other systems reviewed and are negative.   Physical Exam Vital Signs  I have reviewed the triage vital signs BP (!) 109/47 (BP Location:  Right Arm)   Pulse 82   Temp 98.4 F (36.9 C) (Oral)   Resp 20   Ht 5\' 7"  (1.702 m)   Wt (!) 153.3 kg   SpO2 96%   BMI 52.94 kg/m  Physical Exam Vitals and nursing note reviewed.  Constitutional:      General: She is not in acute distress.    Appearance: Normal appearance. She is obese. She is not ill-appearing.  HENT:     Head: Normocephalic and atraumatic.     Right Ear: External ear normal.     Left Ear: External ear normal.     Nose: Nose normal.     Mouth/Throat:     Mouth: Mucous membranes are moist.  Eyes:     General: No scleral icterus.       Right eye: No discharge.        Left eye: No discharge.  Cardiovascular:     Rate and Rhythm: Tachycardia present. Rhythm irregular.     Pulses: Normal pulses.     Heart sounds: Normal heart sounds.  Pulmonary:     Effort: Pulmonary effort is normal. No respiratory distress.     Breath sounds: Normal breath sounds. No stridor.  Abdominal:     General: Abdomen is flat. There is no distension.     Palpations: Abdomen is soft.     Tenderness: There is generalized abdominal tenderness.     Comments: Abdominal wall rash consistent with cellulitis  Musculoskeletal:     Cervical back: No rigidity.     Right lower leg: No edema.     Left lower leg: No edema.  Skin:    General: Skin is warm and dry.     Capillary Refill: Capillary refill takes less than 2 seconds.  Neurological:     Mental Status: She is alert and oriented to person, place, and time.     GCS: GCS eye subscore is 4. GCS verbal subscore is 5. GCS motor subscore is 6.  Psychiatric:        Mood and Affect: Mood normal.        Behavior: Behavior normal. Behavior is cooperative.     ED Results and Treatments Labs (all labs ordered are listed, but only abnormal results are displayed) Labs Reviewed  CBC WITH DIFFERENTIAL/PLATELET - Abnormal; Notable for the following components:      Result Value   RBC 3.44 (*)    Hemoglobin 11.5 (*)    HCT 33.3 (*)    RDW  16.2 (*)    Platelets 23 (*)    Lymphs Abs 0.2 (*)    All other components within normal limits  COMPREHENSIVE METABOLIC PANEL - Abnormal; Notable for the following components:   Potassium 2.7 (*)    Chloride 96 (*)    Glucose, Bld 163 (*)    Creatinine, Ser 1.32 (*)    Calcium 8.8 (*)    Total Protein 6.2 (*)    Albumin 2.8 (*)    AST 53 (*)    Total Bilirubin 6.4 (*)    GFR, Estimated 44 (*)    All other components within normal limits  URINALYSIS, ROUTINE W REFLEX MICROSCOPIC - Abnormal; Notable for the following components:   Specific Gravity, Urine 1.034 (*)    All other components within normal limits  GLUCOSE, CAPILLARY - Abnormal; Notable  for the following components:   Glucose-Capillary 188 (*)    All other components within normal limits  COMPREHENSIVE METABOLIC PANEL - Abnormal; Notable for the following components:   Sodium 133 (*)    Potassium 2.8 (*)    Glucose, Bld 102 (*)    Creatinine, Ser 1.39 (*)    Calcium 7.9 (*)    Total Protein 5.7 (*)    Albumin 2.5 (*)    AST 53 (*)    Total Bilirubin 4.9 (*)    GFR, Estimated 42 (*)    All other components within normal limits  CBC - Abnormal; Notable for the following components:   WBC 2.5 (*)    RBC 3.06 (*)    Hemoglobin 10.2 (*)    HCT 31.0 (*)    MCV 101.3 (*)    RDW 16.3 (*)    Platelets 23 (*)    All other components within normal limits  GLUCOSE, CAPILLARY - Abnormal; Notable for the following components:   Glucose-Capillary 103 (*)    All other components within normal limits  GLUCOSE, CAPILLARY - Abnormal; Notable for the following components:   Glucose-Capillary 118 (*)    All other components within normal limits  BASIC METABOLIC PANEL - Abnormal; Notable for the following components:   Potassium 2.8 (*)    Chloride 97 (*)    Glucose, Bld 147 (*)    Creatinine, Ser 1.37 (*)    Calcium 8.4 (*)    GFR, Estimated 42 (*)    All other components within normal limits  BASIC METABOLIC PANEL -  Abnormal; Notable for the following components:   Sodium 133 (*)    Potassium 3.3 (*)    Glucose, Bld 112 (*)    BUN 24 (*)    Creatinine, Ser 1.30 (*)    Calcium 8.1 (*)    GFR, Estimated 45 (*)    All other components within normal limits  GLUCOSE, CAPILLARY - Abnormal; Notable for the following components:   Glucose-Capillary 160 (*)    All other components within normal limits  GLUCOSE, CAPILLARY - Abnormal; Notable for the following components:   Glucose-Capillary 105 (*)    All other components within normal limits  GLUCOSE, CAPILLARY - Abnormal; Notable for the following components:   Glucose-Capillary 124 (*)    All other components within normal limits  LIPASE, BLOOD  MAGNESIUM  GLUCOSE, CAPILLARY  GLUCOSE, CAPILLARY  MAGNESIUM  GLUCOSE, CAPILLARY                                                                                                                          Radiology No results found.  Pertinent labs & imaging results that were available during my care of the patient were reviewed by me and considered in my medical decision making (see MDM for details).  Medications Ordered in ED Medications  potassium chloride 10 mEq in 100 mL IVPB (0 mEq Intravenous Stopped 04/06/23 1650)  sodium chloride 0.9 %  bolus 1,000 mL (0 mLs Intravenous Stopped 04/06/23 0822)  ondansetron (ZOFRAN) injection 4 mg (4 mg Intravenous Given 04/06/23 0429)  famotidine (PEPCID) IVPB 20 mg premix (0 mg Intravenous Stopped 04/06/23 0505)  potassium chloride 10 mEq in 100 mL IVPB (0 mEq Intravenous Stopped 04/06/23 0822)  0.9 %  sodium chloride infusion (0 mLs Intravenous Stopped 04/06/23 1657)  iohexol (OMNIPAQUE) 300 MG/ML solution 100 mL (100 mLs Intravenous Contrast Given 04/06/23 0504)  metoCLOPramide (REGLAN) injection 5 mg (5 mg Intravenous Given 04/06/23 0542)  diphenhydrAMINE (BENADRYL) injection 25 mg (25 mg Intravenous Given 04/06/23 0538)  dicyclomine (BENTYL) injection 20 mg (20 mg  Intramuscular Given 04/06/23 0627)  potassium chloride SA (KLOR-CON M) CR tablet 40 mEq (40 mEq Oral Given 04/07/23 2109)  potassium chloride SA (KLOR-CON M) 20 MEQ CR tablet (  Duplicate 04/07/23 1929)  potassium chloride SA (KLOR-CON M) 20 MEQ CR tablet (  Duplicate 04/07/23 1929)  potassium chloride SA (KLOR-CON M) CR tablet 40 mEq (40 mEq Oral Given 04/08/23 5784)                                                                                                                                     Procedures Procedures  (including critical care time)  Medical Decision Making / ED Course    Medical Decision Making:    FALLON HAECKER is a 67 y.o. female  with past medical history as below, significant for CHF, DM 2, fibromyalgia, gastric varices, HLD, HTN, liver cirrhosis, NASH, A-fib, morbid obesity who presents to the ED with complaint of nausea vomiting.. The complaint involves an extensive differential diagnosis and also carries with it a high risk of complications and morbidity.  Serious etiology was considered. Ddx includes but is not limited to: Differential diagnosis includes but is not exclusive to acute cholecystitis, intrathoracic causes for epigastric abdominal pain, gastritis, duodenitis, pancreatitis, small bowel or large bowel obstruction, abdominal aortic aneurysm, hernia, gastritis, etc. Differential diagnosis includes but is not exclusive to ectopic pregnancy, ovarian cyst, ovarian torsion, acute appendicitis, urinary tract infection, endometriosis, bowel obstruction, hernia, colitis, renal colic, gastroenteritis, volvulus etc.   Complete initial physical exam performed, notably the patient  was nauseated, tachycardic, abdomen nonperitoneal.    Reviewed and confirmed nursing documentation for past medical history, family history, social history.  Vital signs reviewed.    Clinical Course as of 04/12/23 1526  Thu Apr 06, 2023  0417 Platelets(!!): 23 Similar to prior [SG]  0437  Creatinine(!): 1.32 Similar to prior  [SG]  0437 Potassium(!!): 2.7 Will replace  [SG]  0441 Calcium(!): 8.8 Similar to prior [SG]  0606 Vital signs improving, nausea improving [SG]  0628 Hemoglobin(!): 11.5 Similar to prior [SG]  0655 Feeling better, trial ing PO [SG]    Clinical Course User Index [SG] Tanda Rockers A, DO   CT imaging stable Mild improvement to her nausea with Zofran but persisting.  Will give  Reglan/Benadryl. Replace electrolytes, rehydrate. Feeling somewhat better Signed out to incoming EDP pending recheck and PO challenge She is attempting to drink some ginger ale currently If unable to PO would favor admission      Additional history obtained: -Additional history obtained from spouse -External records from outside source obtained and reviewed including: Chart review including previous notes, labs, imaging, consultation notes including recent admission, home medications, prior labs and imaging LVEF 55-60% 6/24    Lab Tests: -I ordered, reviewed, and interpreted labs.   The pertinent results include:   Labs Reviewed  CBC WITH DIFFERENTIAL/PLATELET - Abnormal; Notable for the following components:      Result Value   RBC 3.44 (*)    Hemoglobin 11.5 (*)    HCT 33.3 (*)    RDW 16.2 (*)    Platelets 23 (*)    Lymphs Abs 0.2 (*)    All other components within normal limits  COMPREHENSIVE METABOLIC PANEL - Abnormal; Notable for the following components:   Potassium 2.7 (*)    Chloride 96 (*)    Glucose, Bld 163 (*)    Creatinine, Ser 1.32 (*)    Calcium 8.8 (*)    Total Protein 6.2 (*)    Albumin 2.8 (*)    AST 53 (*)    Total Bilirubin 6.4 (*)    GFR, Estimated 44 (*)    All other components within normal limits  URINALYSIS, ROUTINE W REFLEX MICROSCOPIC - Abnormal; Notable for the following components:   Specific Gravity, Urine 1.034 (*)    All other components within normal limits  GLUCOSE, CAPILLARY - Abnormal; Notable for the following  components:   Glucose-Capillary 188 (*)    All other components within normal limits  COMPREHENSIVE METABOLIC PANEL - Abnormal; Notable for the following components:   Sodium 133 (*)    Potassium 2.8 (*)    Glucose, Bld 102 (*)    Creatinine, Ser 1.39 (*)    Calcium 7.9 (*)    Total Protein 5.7 (*)    Albumin 2.5 (*)    AST 53 (*)    Total Bilirubin 4.9 (*)    GFR, Estimated 42 (*)    All other components within normal limits  CBC - Abnormal; Notable for the following components:   WBC 2.5 (*)    RBC 3.06 (*)    Hemoglobin 10.2 (*)    HCT 31.0 (*)    MCV 101.3 (*)    RDW 16.3 (*)    Platelets 23 (*)    All other components within normal limits  GLUCOSE, CAPILLARY - Abnormal; Notable for the following components:   Glucose-Capillary 103 (*)    All other components within normal limits  GLUCOSE, CAPILLARY - Abnormal; Notable for the following components:   Glucose-Capillary 118 (*)    All other components within normal limits  BASIC METABOLIC PANEL - Abnormal; Notable for the following components:   Potassium 2.8 (*)    Chloride 97 (*)    Glucose, Bld 147 (*)    Creatinine, Ser 1.37 (*)    Calcium 8.4 (*)    GFR, Estimated 42 (*)    All other components within normal limits  BASIC METABOLIC PANEL - Abnormal; Notable for the following components:   Sodium 133 (*)    Potassium 3.3 (*)    Glucose, Bld 112 (*)    BUN 24 (*)    Creatinine, Ser 1.30 (*)    Calcium 8.1 (*)    GFR, Estimated  45 (*)    All other components within normal limits  GLUCOSE, CAPILLARY - Abnormal; Notable for the following components:   Glucose-Capillary 160 (*)    All other components within normal limits  GLUCOSE, CAPILLARY - Abnormal; Notable for the following components:   Glucose-Capillary 105 (*)    All other components within normal limits  GLUCOSE, CAPILLARY - Abnormal; Notable for the following components:   Glucose-Capillary 124 (*)    All other components within normal limits   LIPASE, BLOOD  MAGNESIUM  GLUCOSE, CAPILLARY  GLUCOSE, CAPILLARY  MAGNESIUM  GLUCOSE, CAPILLARY    Notable for as above  EKG   EKG Interpretation Date/Time:  Thursday April 06 2023 04:39:57 EDT Ventricular Rate:  106 PR Interval:    QRS Duration:  91 QT Interval:  329 QTC Calculation: 437 R Axis:   -57  Text Interpretation: Atrial fibrillation Left anterior fascicular block Low voltage, precordial leads Abnormal R-wave progression, early transition Abnormal T, consider ischemia, lateral leads Confirmed by Tanda Rockers ([id number]) on 04/06/2023 4:40:54 AM         Imaging Studies ordered: I ordered imaging studies including CTAP I independently visualized the following imaging with scope of interpretation limited to determining acute life threatening conditions related to emergency care; findings noted above, significant for PENDING I independently visualized and interpreted imaging. I agree with the radiologist interpretation   Medicines ordered and prescription drug management: Meds ordered this encounter  Medications   sodium chloride 0.9 % bolus 1,000 mL   ondansetron (ZOFRAN) injection 4 mg   famotidine (PEPCID) IVPB 20 mg premix   potassium chloride 10 mEq in 100 mL IVPB   0.9 %  sodium chloride infusion   DISCONTD: metoprolol tartrate (LOPRESSOR) tablet 25 mg   iohexol (OMNIPAQUE) 300 MG/ML solution 100 mL   metoCLOPramide (REGLAN) injection 5 mg   diphenhydrAMINE (BENADRYL) injection 25 mg   DISCONTD: sodium chloride 0.9 % bolus 1,000 mL   DISCONTD: dicyclomine (BENTYL) capsule 10 mg   DISCONTD: alum & mag hydroxide-simeth (MAALOX/MYLANTA) 200-200-20 MG/5ML suspension 30 mL   DISCONTD: lidocaine (XYLOCAINE) 2 % viscous mouth solution 15 mL   dicyclomine (BENTYL) injection 20 mg   DISCONTD: morphine (PF) 4 MG/ML injection 4 mg   DISCONTD: rifaximin (XIFAXAN) tablet 550 mg   DISCONTD: digoxin (LANOXIN) tablet 0.25 mg   DISCONTD: diltiazem (CARDIZEM CD) 24 hr capsule  180 mg   DISCONTD: metoprolol tartrate (LOPRESSOR) tablet 25 mg   DISCONTD: midodrine (PROAMATINE) tablet 10 mg   DISCONTD: torsemide (DEMADEX) tablet 20 mg   DISCONTD: lactulose (CHRONULAC) 10 GM/15ML solution 10 g   DISCONTD: insulin aspart (novoLOG) injection 0-15 Units    Order Specific Question:   Correction coverage:    Answer:   Moderate (average weight, post-op)    Order Specific Question:   CBG < 70:    Answer:   Implement Hypoglycemia Standing Orders and refer to Hypoglycemia Standing Orders sidebar report    Order Specific Question:   CBG 70 - 120:    Answer:   0 units    Order Specific Question:   CBG 121 - 150:    Answer:   2 units    Order Specific Question:   CBG 151 - 200:    Answer:   3 units    Order Specific Question:   CBG 201 - 250:    Answer:   5 units    Order Specific Question:   CBG 251 - 300:    Answer:  8 units    Order Specific Question:   CBG 301 - 350:    Answer:   11 units    Order Specific Question:   CBG 351 - 400:    Answer:   15 units    Order Specific Question:   CBG > 400    Answer:   call MD and obtain STAT lab verification   DISCONTD: insulin aspart (novoLOG) injection 0-5 Units    Order Specific Question:   Correction coverage:    Answer:   HS scale    Order Specific Question:   CBG < 70:    Answer:   Implement Hypoglycemia Standing Orders and refer to Hypoglycemia Standing Orders sidebar report    Order Specific Question:   CBG 70 - 120:    Answer:   0 units    Order Specific Question:   CBG 121 - 150:    Answer:   0 units    Order Specific Question:   CBG 151 - 200:    Answer:   0 units    Order Specific Question:   CBG 201 - 250:    Answer:   2 units    Order Specific Question:   CBG 251 - 300:    Answer:   3 units    Order Specific Question:   CBG 301 - 350:    Answer:   4 units    Order Specific Question:   CBG 351 - 400:    Answer:   5 units    Order Specific Question:   CBG > 400    Answer:   call MD and obtain STAT lab  verification   DISCONTD: oxyCODONE (Oxy IR/ROXICODONE) immediate release tablet 5 mg   DISCONTD: traZODone (DESYREL) tablet 25 mg   DISCONTD: ondansetron (ZOFRAN) tablet 4 mg   DISCONTD: ondansetron (ZOFRAN) injection 4 mg   DISCONTD: albuterol (PROVENTIL) (2.5 MG/3ML) 0.083% nebulizer solution 2.5 mg   DISCONTD: cefTRIAXone (ROCEPHIN) 2 g in sodium chloride 0.9 % 100 mL IVPB    Order Specific Question:   Antibiotic Indication:    Answer:   Cellulitis   potassium chloride 10 mEq in 100 mL IVPB   DISCONTD: potassium chloride 10 mEq in 100 mL IVPB   DISCONTD: magnesium sulfate IVPB 2 g 50 mL   DISCONTD: cefTRIAXone (ROCEPHIN) 2 g in sodium chloride 0.9 % 100 mL IVPB    Order Specific Question:   Antibiotic Indication:    Answer:   Cellulitis   DISCONTD: magnesium sulfate IVPB 2 g 50 mL   DISCONTD: potassium chloride 10 mEq in 100 mL IVPB   DISCONTD: magnesium oxide (MAG-OX) tablet 400 mg   potassium chloride SA (KLOR-CON M) CR tablet 40 mEq   DISCONTD: magnesium sulfate 2 GM/50ML IVPB    Shrestha, Samjhana: cabinet override   potassium chloride SA (KLOR-CON M) 20 MEQ CR tablet    Shrestha, Samjhana: cabinet override   potassium chloride SA (KLOR-CON M) 20 MEQ CR tablet    Shrestha, Samjhana: cabinet override   potassium chloride SA (KLOR-CON M) CR tablet 40 mEq   cefadroxil (DURICEF) 1 g tablet    Sig: Take 1 tablet (1 g total) by mouth 2 (two) times daily for 5 days.    Dispense:  10 tablet    Refill:  0    -I have reviewed the patients home medicines and have made adjustments as needed   Consultations Obtained: na   Cardiac Monitoring: The patient was maintained  on a cardiac monitor.  I personally viewed and interpreted the cardiac monitored which showed an underlying rhythm of: NSR  Social Determinants of Health:  Diagnosis or treatment significantly limited by social determinants of health: former smoker and obesity   Reevaluation: After the interventions noted above,  I reevaluated the patient and found that they have improved  Co morbidities that complicate the patient evaluation  Past Medical History:  Diagnosis Date   Anemia    hx of   Arthritis    knees   CHF (congestive heart failure) (HCC)    chronic diastolic   Diabetes mellitus without complication (HCC)    type 2   Dyspnea    Dysrhythmia    Fibromyalgia    Gastric varices    large   H/O transfusion of platelets    Heart murmur    never has caused any problems   Hx of colonic polyps    s/p partial colectomy   Hyperlipidemia    Hypertension    Joint pain    in knees and back spasms   Left leg swelling    wear compression hose   Liver cirrhosis secondary to NASH (nonalcoholic steatohepatitis) (HCC) dx nov 2014   had enlarged spleen also    Morbid obesity (HCC)    Persistent atrial fibrillation (HCC)    Pneumonia 10/2016   x 5   PONV (postoperative nausea and vomiting)    in past none recent   Portal hypertension (HCC)    Sleep apnea    uses cpap, setting varies between 14-20   Spleen enlarged    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    Final Clinical Impression(s) / ED Diagnoses Final diagnoses:  Generalized abdominal pain  Nausea and vomiting, unspecified vomiting type     This chart was dictated using voice recognition software.  Despite best efforts to proofread,  errors can occur which can change the documentation meaning.    Tanda Rockers A, DO 04/12/23 1526

## 2023-04-06 NOTE — H&P (Signed)
History and Physical  ALEXANDRE LIGHTSEY JXB:[id number] DOB: 1956-05-09 DOA: 04/06/2023  PCP: Darrow Bussing, MD   Chief Complaint: Nausea, vomiting  HPI: Hannah Khan is a 67 y.o. female with medical history significant for A-fib RVR, chronic diastolic congestive heart failure, recent abdominal wall cellulitis and sepsis, Nash cirrhosis, chronic thrombocytopenia being admitted to the hospital with 3 days of nausea and vomiting.  The patient states she started having nausea followed by vomiting for the last 3 days, mainly it is dry heaves and retching.  She has been unable to keep anything down for the last 2 to 3 days.  Denies any fevers or chills, she is concerned because she has been unable to keep down her lactulose, she was started on p.o. Bactrim yesterday at urgent care due to recurrent abdominal wall cellulitis.  Thinks she was able to keep the doses down yesterday.  She is unsure whether the cellulitis is better.  Says that she does continue to have regular bowel movements, last one was yesterday.  Denies any chest pain, worsening lower extremity edema, cough, or shortness of breath.  ED Course: On evaluation in the emergency department, found to be borderline tachycardic, slightly hypertensive.  Afebrile, stable on room air.  Lab work reveals no leukocytosis, stable anemia hemoglobin 11.5, stable thrombocytopenia platelets 23,000.  Potassium 2.7, creatinine 1.32 which seems to be her most recent baseline.  Review of Systems: Please see HPI for pertinent positives and negatives. A complete 10 system review of systems are otherwise negative.  Past Medical History:  Diagnosis Date   Anemia    hx of   Arthritis    knees   CHF (congestive heart failure) (HCC)    chronic diastolic   Diabetes mellitus without complication (HCC)    type 2   Dyspnea    Dysrhythmia    Fibromyalgia    Gastric varices    large   H/O transfusion of platelets    Heart murmur    never has caused any  problems   Hx of colonic polyps    s/p partial colectomy   Hyperlipidemia    Hypertension    Joint pain    in knees and back spasms   Left leg swelling    wear compression hose   Liver cirrhosis secondary to NASH (nonalcoholic steatohepatitis) (HCC) dx nov 2014   had enlarged spleen also    Morbid obesity (HCC)    Persistent atrial fibrillation (HCC)    Pneumonia 10/2016   x 5   PONV (postoperative nausea and vomiting)    in past none recent   Portal hypertension (HCC)    Sleep apnea    uses cpap, setting varies between 14-20   Spleen enlarged    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 [facility])    Past Surgical History:  Procedure Laterality Date   BREAST LUMPECTOMY WITH RADIOACTIVE SEED LOCALIZATION Right 08/29/2017   Procedure: RIGHT BREAST LUMPECTOMY WITH RADIOACTIVE SEEDS LOCALIZATION;  Surgeon: Harriette Bouillon, MD;  Location: [facility] OR;  Service: General;  Laterality: Right;   CESAREAN SECTION  1989   CHOLECYSTECTOMY  20 yrs ago   COLECTOMY  2011   COLONOSCOPY     COLONOSCOPY N/A 09/05/2018   Procedure: COLONOSCOPY;  Surgeon: Willis Modena, MD;  Location: [facility];  Service: Endoscopy;  Laterality: N/A;   COLONOSCOPY WITH PROPOFOL Bilateral 05/04/2022   Procedure: COLONOSCOPY WITH PROPOFOL;  Surgeon: Willis Modena, MD;  Location: [facility];  Service: Gastroenterology;  Laterality: Bilateral;   DILATATION &  CURETTAGE/HYSTEROSCOPY WITH MYOSURE N/A 05/06/2016   Procedure: DILATATION & CURETTAGE/HYSTEROSCOPY WITH MYOSURE WITH POLYPECTOMY;  Surgeon: Myna Hidalgo, DO;  Location: [facility]S;  Service: Gynecology;  Laterality: N/A;   ESOPHAGOGASTRODUODENOSCOPY (EGD) WITH PROPOFOL N/A 09/04/2013   Procedure: ESOPHAGOGASTRODUODENOSCOPY (EGD) WITH PROPOFOL;  Surgeon: Willis Modena, MD;  Location: [facility];  Service: Endoscopy;  Laterality: N/A;   ESOPHAGOGASTRODUODENOSCOPY (EGD) WITH PROPOFOL Bilateral 05/04/2022   Procedure: ESOPHAGOGASTRODUODENOSCOPY (EGD) WITH PROPOFOL;   Surgeon: Willis Modena, MD;  Location: [facility];  Service: Gastroenterology;  Laterality: Bilateral;   IR INTRAVASCULAR ULTRASOUND NON CORONARY  12/23/2022   IR RADIOLOGIST EVAL & MGMT  09/21/2022   IR RADIOLOGIST EVAL & MGMT  10/06/2022   IR RADIOLOGIST EVAL & MGMT  11/02/2022   IR RADIOLOGIST EVAL & MGMT  01/31/2023   IR TIPS  12/23/2022   IR US GUIDE VASC ACCESS RIGHT  12/23/2022   KNEE ARTHROSCOPY  yrs ago   bilateral, one done x 1, one done twice   POLYPECTOMY  09/05/2018   Procedure: POLYPECTOMY;  Surgeon: Willis Modena, MD;  Location: [facility];  Service: Endoscopy;;   RADIOLOGY WITH ANESTHESIA N/A 12/23/2022   Procedure: TIPS;  Surgeon: Bennie Dallas, MD;  Location: [facility] OR;  Service: Radiology;  Laterality: N/A;    Social History:  reports that she quit smoking about 48 years ago. Her smoking use included cigarettes. She started smoking about 51 years ago. She has a 3 pack-year smoking history. She has never used smokeless tobacco. She reports that she does not currently use alcohol. She reports that she does not use drugs.   Allergies  Allergen Reactions   Celecoxib Other (See Comments)    "speech slurred" with celebrex   Shellfish Allergy Swelling and Rash    TINGLING AND SWELLING OF LIPS. LARGE WHELPS QUARTER-SIZE   Barium-Containing Compounds Other (See Comments)    TACHYCARDIA   Prednisone Other (See Comments)    TACHYCARDIA   Statins Other (See Comments)    AGGRAVATED FIBROMYALGIA   Fentanyl     Hallucinations    Ibuprofen Other (See Comments)    UNSPECIFIED SPECIFIC REACTION >> "DUE TO PLATELETS"   Sglt2 Inhibitors     Cellulitis of pannus   Tramadol     Hallucination    Tylenol [Acetaminophen] Other (See Comments)    UNSPECIFIED SPECIFIC REACTION >> "DUE TO PLATELETS"   Zetia [Ezetimibe]     Headache    Family History  Problem Relation Age of Onset   Heart failure Mother    Cancer Mother    Hyperlipidemia Mother    Congestive Heart Failure Mother     Stroke Mother    Lung cancer Father    Cancer Father    Cancer Brother    Hyperlipidemia Brother    Stroke Other    Breast cancer Neg Hx      Prior to Admission medications   Medication Sig Start Date End Date Taking? Authorizing Provider  albuterol (VENTOLIN HFA) 108 (90 Base) MCG/ACT inhaler Inhale 1-2 puffs into the lungs every 6 (six) hours as needed for wheezing or shortness of breath.    [provider]  diclofenac Sodium (VOLTAREN) 1 % GEL Apply 1 Application topically at bedtime as needed (pain).    [provider]  digoxin (LANOXIN) 0.25 MG tablet Take 1 tablet (0.25 mg total) by mouth daily. 04/03/23   Chilton Si, MD  diltiazem (CARDIZEM CD) 180 MG 24 hr capsule TAKE 1 CAPSULE BY MOUTH EVERY DAY Patient taking  differently: Take 180 mg by mouth every evening. 10/20/22   Alver Sorrow, NP  lactulose (CHRONULAC) 10 GM/15ML solution Take 10 g by mouth 3 (three) times daily. 03/17/21   [provider]  Lancets (ONETOUCH DELICA PLUS [place]) MISC Apply topically 3 (three) times daily. 04/10/20   [provider]  metolazone (ZAROXOLYN) 5 MG tablet Take 1 tablet (5 mg total) by mouth once a week. Take one tablet on Sundays 30 minutes before Bumex 02/28/23   Alver Sorrow, NP  metoprolol tartrate (LOPRESSOR) 25 MG tablet Take 1 tablet (25 mg total) by mouth 2 (two) times daily. 04/03/23   Chilton Si, MD  midodrine (PROAMATINE) 10 MG tablet Take 1 tablet (10 mg total) by mouth 2 (two) times daily with a meal. 04/03/23   Chilton Si, MD  Multiple Vitamins-Minerals (PRESERVISION AREDS 2) CAPS Take 1 capsule by mouth 2 (two) times daily.    [provider]  [facility] VERIO test strip 1 each 3 (three) times daily. 04/13/20   [provider]  potassium chloride (MICRO-K) 10 MEQ CR capsule Take 40 mEq ( 4 tablets)  by mouth twice per day 03/12/23   Zannie Cove, MD  Propylene Glycol (SYSTANE BALANCE) 0.6 % SOLN Place 1  drop into both eyes 2 (two) times daily.    [provider]  Semaglutide, 2 MG/DOSE, (OZEMPIC, 2 MG/DOSE,) 8 MG/3ML SOPN Inject 2 mg into the skin once a week. Patient taking differently: Inject 2 mg into the skin every Thursday. 11/03/22   Alver Sorrow, NP  torsemide (DEMADEX) 20 MG tablet Take 1 tablet (20 mg total) by mouth 2 (two) times daily. 03/12/23   Zannie Cove, MD  XIFAXAN 550 MG TABS tablet Take 550 mg by mouth 2 (two) times daily. 03/13/21   [provider]    Physical Exam: BP (!) 116/58 Comment: Simultaneous filing. User may not have seen previous data.  Pulse 100 Comment: Simultaneous filing. User may not have seen previous data.  Temp 99.3 F (37.4 C)   Resp 18 Comment: Simultaneous filing. User may not have seen previous data.  Ht 5\' 7"  (1.702 m)   Wt (!) 153.3 kg   SpO2 93% Comment: Simultaneous filing. User may not have seen previous data.  BMI 52.94 kg/m   General:  Alert, oriented, calm, in no acute distress, morbidly obese, sitting up on the edge of the bed. Pleasant and cooperative, good historian, non-toxic and very comfortable in appearance  Eyes: EOMI, clear conjuctivae, white sclerea Neck: supple, no masses, trachea mildline  Cardiovascular: Irregularly irregular, no murmurs or rubs, no peripheral edema  Respiratory: clear to auscultation bilaterally, no wheezes, no crackles  Abdomen: soft, nontender, nondistended, normal bowel tones heard  Skin: dry, no rashes, mild diffuse nontender confluent area of erythema surrounding the navel, and below.  No areas of fluctuance, no areas of tenderness, drainage, or open wound. Musculoskeletal: no joint effusions, normal range of motion  Psychiatric: appropriate affect, normal speech  Neurologic: extraocular muscles intact, clear speech, moving all extremities with intact sensorium          Labs on Admission:  Basic Metabolic Panel: Recent Labs  Lab 04/06/23 0354  NA 135  K 2.7*  CL 96*   CO2 26  GLUCOSE 163*  BUN 21  CREATININE 1.32*  CALCIUM 8.8*   Liver Function Tests: Recent Labs  Lab 04/06/23 0354  AST 53*  ALT 26  ALKPHOS 87  BILITOT 6.4*  PROT 6.2*  ALBUMIN  2.8*   Recent Labs  Lab 04/06/23 0354  LIPASE 30   No results for input(s): "AMMONIA" in the last 168 hours. CBC: Recent Labs  Lab 04/06/23 0354  WBC 6.2  NEUTROABS 5.6  HGB 11.5*  HCT 33.3*  MCV 96.8  PLT 23*   Cardiac Enzymes: No results for input(s): "CKTOTAL", "CKMB", "CKMBINDEX", "TROPONINI" in the last 168 hours.  BNP (last 3 results) Recent Labs    11/12/22 1443 03/05/23 1549  BNP 434.3* 362.6*    ProBNP (last 3 results) No results for input(s): "PROBNP" in the last 8760 hours.  CBG: No results for input(s): "GLUCAP" in the last 168 hours.  Radiological Exams on Admission: CT ABDOMEN PELVIS W CONTRAST  Result Date: 04/06/2023 CLINICAL DATA:  Abdominal pain with nausea and vomiting. History of cirrhosis. EXAM: CT ABDOMEN AND PELVIS WITH CONTRAST TECHNIQUE: Multidetector CT imaging of the abdomen and pelvis was performed using the standard protocol following bolus administration of intravenous contrast. RADIATION DOSE REDUCTION: This exam was performed according to the departmental dose-optimization program which includes automated exposure control, adjustment of the mA and/or kV according to patient size and/or use of iterative reconstruction technique. CONTRAST:  OMNIPAQUE IOHEXOL 300 MG/ML  SOLN COMPARISON:  09/30/2022 FINDINGS: Lower chest: Tiny pleural effusions with some mild basilar atelectasis. Paraesophageal varices evident. Hepatobiliary: Cirrhotic liver morphology. No focal mass lesion evident within the liver parenchyma. Cholecystectomy. No intrahepatic or extrahepatic biliary dilation. Pancreas: No focal mass lesion. No dilatation of the main duct. No intraparenchymal cyst. No peripancreatic edema. Spleen: Splenomegaly as before. Adrenals/Urinary Tract: No  adrenal nodule or mass. Tiny well-defined homogeneous low-density lesions in both kidneys are too small to characterize but are statistically most likely benign and probably cysts. No followup imaging is recommended. No evidence for hydroureter. The urinary bladder appears normal for the degree of distention. Stomach/Bowel: Stomach is unremarkable. No gastric wall thickening. No evidence of outlet obstruction. Gastric varices evident. Duodenum is normally positioned as is the ligament of Treitz. No small bowel wall thickening. No small bowel dilatation. Right hemicolectomy. No gross colonic mass. No colonic wall thickening. Vascular/Lymphatic: There is mild atherosclerotic calcification of the abdominal aorta without aneurysm. Chronic portal vein occlusion again noted with cavernous transformation. Splenic vein patent. Extensive collateralization noted in the upper abdomen. There is no gastrohepatic or hepatoduodenal ligament lymphadenopathy. No retroperitoneal or mesenteric lymphadenopathy. No pelvic sidewall lymphadenopathy. Reproductive: IUD is low as before with the stem appearing to protrude into the cervical stroma. There is no adnexal mass. Other: No substantial intraperitoneal free fluid. Body wall edema evident. Skin thickening and subcutaneous stranding in the lower anterior abdominal wall may be related to edema although cellulitis could have this appearance. Musculoskeletal: No worrisome lytic or sclerotic osseous abnormality. IMPRESSION: 1. No acute findings in the abdomen or pelvis. Specifically, no findings to explain the patient's history of abdominal pain with nausea and vomiting. 2. Cirrhotic liver morphology with sequelae of portal venous hypertension including splenomegaly and extensive collateralization in the upper abdomen with paraesophageal and gastric varices. 3. Chronic portal vein occlusion with cavernous transformation. 4. Tiny pleural effusions with mild basilar atelectasis. 5. Body wall  edema. Skin thickening and subcutaneous stranding in the lower anterior abdominal wall may be related to edema although cellulitis could have this appearance. 6.  Aortic Atherosclerosis (ICD10-I70.0). Electronically Signed   By: Kennith Center M.D.   On: 04/06/2023 05:46    Assessment/Plan This is a 67 year old female with a history of Nash cirrhosis, morbid obesity, type  2 diabetes, chronic atrial fibrillation not on anticoagulation, type 2 diabetes, chronic diastolic congestive heart failure being admitted to the hospital with intractable nausea and vomiting.  Intractable nausea and vomiting -this is likely a viral gastroenteritis, without any evidence of acute bacterial infection such as leukocytosis, fever, or acute findings on abdominal imaging. -Observation admission -Was given IV fluids in the emergency department, will not give further fluids due to starting clear liquid diet, and history of diastolic CHF -Clear liquid diet, advance as tolerated -Pain and nausea medication as needed  NASH cirrhosis -failed TIPS April 2024, continue Xifaxan, lactulose.  Unable to tolerate her medications the last 2 to 3 days, but thankfully no signs or symptoms of encephalopathy.  Abdominal Cellulitis -recently admitted at the end of June with sepsis due to cellulitis, completed a course of antibiotics in the hospital.  Started on Bactrim 7/17 at urgent care.  Currently no signs or symptoms of sepsis. -Will treat empirically with IV Rocephin 2 g daily  DM2 -Check hemoglobin A1c, last was 4.7 in February 2024 -Diabetic diet as diet is advanced -Moderate dose sliding scale insulin  Chronic diastolic CHF-continue appropriate home medications, no evidence of heart failure exacerbation  CKD-renal function seems to be declining over the last year or so, currently creatinine is near her most recent baseline. -Continue torsemide, but low threshold to hold this medication if creatinine is climbing  HypoKalemia  - too nauseated to take home potassium repletion - Due to continued nausea, will give total 60 mEq via IV - check Mg -Recheck potassium in the morning  Chronic Afib -rate controlled, continue home digoxin, metoprolol, not on anticoagulation due to chronic thrombocytopenia  Chronic Anemia - stable, due to cirrhosis  Morbid obesity -BMI 53, complicating all aspects of care  DVT prophylaxis: SCDs only  Full Code  Consults called: None  Admission status: Observation   Time spent: 46 minutes  Jasmen Emrich Sharlette Dense MD [facility] Pager [phone]  If 7PM-7AM, please contact night-coverage [email or web address] Password [email or web address]  04/06/2023, 10:08 AM

## 2023-04-06 NOTE — Plan of Care (Signed)
Plan of Care Note for Accepted Transfer   Patient: Hannah Khan    ION:[id number]     Facility requesting transfer: [facility] Requesting Provider: Dr. Lockie Mola  67 year old female with a history of Elita Boone cirrhosis, failed TIPS procedure, chronic thrombocytopenia being admitted to the hospital for observation due to intractable nausea and vomiting.  Labs and imaging reviewed, given IV fluids and potassium repletion.  CT scan noted with no acute process.  Accepted for observation admission.  Most recent vitals, labs and radiology:  Blood pressure (!) 115/59, pulse 94, temperature 99.3 F (37.4 C), resp. rate 17, height 5\' 7"  (1.702 m), weight (!) 153.3 kg, SpO2 96%.      Latest Ref Rng & Units 04/06/2023    3:54 AM 03/10/2023    1:15 AM 03/08/2023    1:39 AM  CBC  WBC 4.0 - 10.5 K/uL 6.2  3.2  2.5   Hemoglobin 12.0 - 15.0 g/dL 24.4  01.0  27.2   Hematocrit 36.0 - 46.0 % 33.3  31.8  29.3   Platelets 150 - 400 K/uL 23  39  25       Latest Ref Rng & Units 04/06/2023    3:54 AM 03/12/2023   12:40 AM 03/11/2023   12:39 AM  BMP  Glucose 70 - 99 mg/dL 536  644  034   BUN 8 - 23 mg/dL 21  18  18    Creatinine 0.44 - 1.00 mg/dL 7.42  5.95  6.38   Sodium 135 - 145 mmol/L 135  134  136   Potassium 3.5 - 5.1 mmol/L 2.7  3.4  3.4   Chloride 98 - 111 mmol/L 96  101  100   CO2 22 - 32 mmol/L 26  24  28    Calcium 8.9 - 10.3 mg/dL 8.8  8.6  8.6      CT ABDOMEN PELVIS W CONTRAST  Result Date: 04/06/2023 CLINICAL DATA:  Abdominal pain with nausea and vomiting. History of cirrhosis. EXAM: CT ABDOMEN AND PELVIS WITH CONTRAST TECHNIQUE: Multidetector CT imaging of the abdomen and pelvis was performed using the standard protocol following bolus administration of intravenous contrast. RADIATION DOSE REDUCTION: This exam was performed according to the departmental dose-optimization program which includes automated exposure control, adjustment of the mA and/or kV according to patient size and/or use of  iterative reconstruction technique. CONTRAST:  OMNIPAQUE IOHEXOL 300 MG/ML  SOLN COMPARISON:  09/30/2022 FINDINGS: Lower chest: Tiny pleural effusions with some mild basilar atelectasis. Paraesophageal varices evident. Hepatobiliary: Cirrhotic liver morphology. No focal mass lesion evident within the liver parenchyma. Cholecystectomy. No intrahepatic or extrahepatic biliary dilation. Pancreas: No focal mass lesion. No dilatation of the main duct. No intraparenchymal cyst. No peripancreatic edema. Spleen: Splenomegaly as before. Adrenals/Urinary Tract: No adrenal nodule or mass. Tiny well-defined homogeneous low-density lesions in both kidneys are too small to characterize but are statistically most likely benign and probably cysts. No followup imaging is recommended. No evidence for hydroureter. The urinary bladder appears normal for the degree of distention. Stomach/Bowel: Stomach is unremarkable. No gastric wall thickening. No evidence of outlet obstruction. Gastric varices evident. Duodenum is normally positioned as is the ligament of Treitz. No small bowel wall thickening. No small bowel dilatation. Right hemicolectomy. No gross colonic mass. No colonic wall thickening. Vascular/Lymphatic: There is mild atherosclerotic calcification of the abdominal aorta without aneurysm. Chronic portal vein occlusion again noted with cavernous transformation. Splenic vein patent. Extensive collateralization noted in the upper abdomen. There is no gastrohepatic or hepatoduodenal  ligament lymphadenopathy. No retroperitoneal or mesenteric lymphadenopathy. No pelvic sidewall lymphadenopathy. Reproductive: IUD is low as before with the stem appearing to protrude into the cervical stroma. There is no adnexal mass. Other: No substantial intraperitoneal free fluid. Body wall edema evident. Skin thickening and subcutaneous stranding in the lower anterior abdominal wall may be related to edema although cellulitis could have this  appearance. Musculoskeletal: No worrisome lytic or sclerotic osseous abnormality. IMPRESSION: 1. No acute findings in the abdomen or pelvis. Specifically, no findings to explain the patient's history of abdominal pain with nausea and vomiting. 2. Cirrhotic liver morphology with sequelae of portal venous hypertension including splenomegaly and extensive collateralization in the upper abdomen with paraesophageal and gastric varices. 3. Chronic portal vein occlusion with cavernous transformation. 4. Tiny pleural effusions with mild basilar atelectasis. 5. Body wall edema. Skin thickening and subcutaneous stranding in the lower anterior abdominal wall may be related to edema although cellulitis could have this appearance. 6.  Aortic Atherosclerosis (ICD10-I70.0). Electronically Signed   By: Kennith Center M.D.   On: 04/06/2023 05:46     The patient has been accepted for transfer to [facility], depending on bed and resource availability.  The patient will remain under the care and responsibility of the referring provider until they have arrived to our inpatient facility.  Author: Lee-Anne Flicker Sharlette Dense, MD  04/06/2023  Check [email or web address] for on-call coverage.  Nursing staff, Please call [facility] Admits & Consults System-Wide number on Amion as soon as patient's arrival, so appropriate admitting provider can evaluate the pt.

## 2023-04-07 DIAGNOSIS — Z8249 Family history of ischemic heart disease and other diseases of the circulatory system: Secondary | ICD-10-CM | POA: Diagnosis not present

## 2023-04-07 DIAGNOSIS — G4730 Sleep apnea, unspecified: Secondary | ICD-10-CM | POA: Diagnosis present

## 2023-04-07 DIAGNOSIS — L03311 Cellulitis of abdominal wall: Secondary | ICD-10-CM | POA: Diagnosis present

## 2023-04-07 DIAGNOSIS — D61818 Other pancytopenia: Secondary | ICD-10-CM | POA: Diagnosis present

## 2023-04-07 DIAGNOSIS — N1831 Chronic kidney disease, stage 3a: Secondary | ICD-10-CM | POA: Diagnosis present

## 2023-04-07 DIAGNOSIS — R1084 Generalized abdominal pain: Secondary | ICD-10-CM | POA: Diagnosis present

## 2023-04-07 DIAGNOSIS — K7460 Unspecified cirrhosis of liver: Secondary | ICD-10-CM | POA: Diagnosis present

## 2023-04-07 DIAGNOSIS — R112 Nausea with vomiting, unspecified: Secondary | ICD-10-CM | POA: Diagnosis present

## 2023-04-07 DIAGNOSIS — Z885 Allergy status to narcotic agent status: Secondary | ICD-10-CM | POA: Diagnosis not present

## 2023-04-07 DIAGNOSIS — Z6841 Body Mass Index (BMI) 40.0 and over, adult: Secondary | ICD-10-CM | POA: Diagnosis not present

## 2023-04-07 DIAGNOSIS — Z91013 Allergy to seafood: Secondary | ICD-10-CM | POA: Diagnosis not present

## 2023-04-07 DIAGNOSIS — M797 Fibromyalgia: Secondary | ICD-10-CM | POA: Diagnosis present

## 2023-04-07 DIAGNOSIS — E119 Type 2 diabetes mellitus without complications: Secondary | ICD-10-CM | POA: Diagnosis not present

## 2023-04-07 DIAGNOSIS — E785 Hyperlipidemia, unspecified: Secondary | ICD-10-CM | POA: Diagnosis present

## 2023-04-07 DIAGNOSIS — Z886 Allergy status to analgesic agent status: Secondary | ICD-10-CM | POA: Diagnosis not present

## 2023-04-07 DIAGNOSIS — K766 Portal hypertension: Secondary | ICD-10-CM | POA: Diagnosis present

## 2023-04-07 DIAGNOSIS — Z888 Allergy status to other drugs, medicaments and biological substances status: Secondary | ICD-10-CM | POA: Diagnosis not present

## 2023-04-07 DIAGNOSIS — A084 Viral intestinal infection, unspecified: Secondary | ICD-10-CM | POA: Diagnosis present

## 2023-04-07 DIAGNOSIS — I5032 Chronic diastolic (congestive) heart failure: Secondary | ICD-10-CM | POA: Diagnosis present

## 2023-04-07 DIAGNOSIS — Z87891 Personal history of nicotine dependence: Secondary | ICD-10-CM | POA: Diagnosis not present

## 2023-04-07 DIAGNOSIS — I4819 Other persistent atrial fibrillation: Secondary | ICD-10-CM | POA: Diagnosis present

## 2023-04-07 DIAGNOSIS — Z9049 Acquired absence of other specified parts of digestive tract: Secondary | ICD-10-CM | POA: Diagnosis not present

## 2023-04-07 DIAGNOSIS — I130 Hypertensive heart and chronic kidney disease with heart failure and stage 1 through stage 4 chronic kidney disease, or unspecified chronic kidney disease: Secondary | ICD-10-CM | POA: Diagnosis present

## 2023-04-07 DIAGNOSIS — I482 Chronic atrial fibrillation, unspecified: Secondary | ICD-10-CM | POA: Diagnosis not present

## 2023-04-07 DIAGNOSIS — E876 Hypokalemia: Secondary | ICD-10-CM | POA: Diagnosis present

## 2023-04-07 DIAGNOSIS — K7581 Nonalcoholic steatohepatitis (NASH): Secondary | ICD-10-CM | POA: Diagnosis present

## 2023-04-07 DIAGNOSIS — E1122 Type 2 diabetes mellitus with diabetic chronic kidney disease: Secondary | ICD-10-CM | POA: Diagnosis present

## 2023-04-07 LAB — COMPREHENSIVE METABOLIC PANEL
ALT: 28 U/L (ref 0–44)
AST: 53 U/L — ABNORMAL HIGH (ref 15–41)
Albumin: 2.5 g/dL — ABNORMAL LOW (ref 3.5–5.0)
Alkaline Phosphatase: 77 U/L (ref 38–126)
Anion gap: 10 (ref 5–15)
BUN: 23 mg/dL (ref 8–23)
CO2: 25 mmol/L (ref 22–32)
Calcium: 7.9 mg/dL — ABNORMAL LOW (ref 8.9–10.3)
Chloride: 98 mmol/L (ref 98–111)
Creatinine, Ser: 1.39 mg/dL — ABNORMAL HIGH (ref 0.44–1.00)
GFR, Estimated: 42 mL/min — ABNORMAL LOW (ref >60)
Glucose, Bld: 102 mg/dL — ABNORMAL HIGH (ref 70–99)
Potassium: 2.8 mmol/L — ABNORMAL LOW (ref 3.5–5.1)
Sodium: 133 mmol/L — ABNORMAL LOW (ref 135–145)
Total Bilirubin: 4.9 mg/dL — ABNORMAL HIGH (ref 0.3–1.2)
Total Protein: 5.7 g/dL — ABNORMAL LOW (ref 6.5–8.1)

## 2023-04-07 LAB — GLUCOSE, CAPILLARY
Glucose-Capillary: 103 mg/dL — ABNORMAL HIGH (ref 70–99)
Glucose-Capillary: 118 mg/dL — ABNORMAL HIGH (ref 70–99)
Glucose-Capillary: 160 mg/dL — ABNORMAL HIGH (ref 70–99)
Glucose-Capillary: 97 mg/dL (ref 70–99)

## 2023-04-07 LAB — CBC
HCT: 31 % — ABNORMAL LOW (ref 36.0–46.0)
Hemoglobin: 10.2 g/dL — ABNORMAL LOW (ref 12.0–15.0)
MCH: 33.3 pg (ref 26.0–34.0)
MCHC: 32.9 g/dL (ref 30.0–36.0)
MCV: 101.3 fL — ABNORMAL HIGH (ref 80.0–100.0)
Platelets: 23 10*3/uL — CL (ref 150–400)
RBC: 3.06 MIL/uL — ABNORMAL LOW (ref 3.87–5.11)
RDW: 16.3 % — ABNORMAL HIGH (ref 11.5–15.5)
WBC: 2.5 10*3/uL — ABNORMAL LOW (ref 4.0–10.5)
nRBC: 0 % (ref 0.0–0.2)

## 2023-04-07 LAB — BASIC METABOLIC PANEL
Anion gap: 7 (ref 5–15)
BUN: 23 mg/dL (ref 8–23)
CO2: 31 mmol/L (ref 22–32)
Calcium: 8.4 mg/dL — ABNORMAL LOW (ref 8.9–10.3)
Chloride: 97 mmol/L — ABNORMAL LOW (ref 98–111)
Creatinine, Ser: 1.37 mg/dL — ABNORMAL HIGH (ref 0.44–1.00)
GFR, Estimated: 42 mL/min — ABNORMAL LOW (ref >60)
Glucose, Bld: 147 mg/dL — ABNORMAL HIGH (ref 70–99)
Potassium: 2.8 mmol/L — ABNORMAL LOW (ref 3.5–5.1)
Sodium: 135 mmol/L (ref 135–145)

## 2023-04-07 MED ORDER — POTASSIUM CHLORIDE CRYS ER 20 MEQ PO TBCR
EXTENDED_RELEASE_TABLET | ORAL | Status: AC
  Filled 2023-04-07: qty 2

## 2023-04-07 MED ORDER — SODIUM CHLORIDE 0.9 % IV SOLN: 2.0000 g | INTRAVENOUS | Status: DC

## 2023-04-07 MED ORDER — MAGNESIUM SULFATE 2 GM/50ML IV SOLN
INTRAVENOUS | Status: AC
  Filled 2023-04-07: qty 50

## 2023-04-07 MED ORDER — MAGNESIUM SULFATE 2 GM/50ML IV SOLN: 2.0000 g | Freq: Once | INTRAVENOUS | Status: DC

## 2023-04-07 MED ORDER — POTASSIUM CHLORIDE CRYS ER 20 MEQ PO TBCR
40.0000 meq | EXTENDED_RELEASE_TABLET | Freq: Three times a day (TID) | ORAL | Status: AC
  Administered 2023-04-07 (×2): 40 meq via ORAL
  Filled 2023-04-07: qty 2

## 2023-04-07 MED ORDER — POTASSIUM CHLORIDE 10 MEQ/100ML IV SOLN: 10.0000 meq | INTRAVENOUS | Status: DC

## 2023-04-07 MED ORDER — MAGNESIUM OXIDE -MG SUPPLEMENT 400 (240 MG) MG PO TABS
400.0000 mg | ORAL_TABLET | Freq: Two times a day (BID) | ORAL | Status: DC
  Administered 2023-04-07 – 2023-04-08 (×2): 400 mg via ORAL
  Filled 2023-04-07 (×2): qty 1

## 2023-04-07 NOTE — Plan of Care (Signed)
  Problem: Clinical Measurements: Goal: Ability to maintain clinical measurements within normal limits will improve Outcome: Progressing Goal: Will remain free from infection Outcome: Progressing Goal: Diagnostic test results will improve Outcome: Progressing Goal: Respiratory complications will improve Outcome: Progressing Goal: Cardiovascular complication will be avoided Outcome: Progressing   Problem: Elimination: Goal: Will not experience complications related to bowel motility Outcome: Progressing Goal: Will not experience complications related to urinary retention Outcome: Progressing   Problem: Pain Managment: Goal: General experience of comfort will improve Outcome: Progressing   Problem: Safety: Goal: Ability to remain free from injury will improve Outcome: Progressing   Problem: Skin Integrity: Goal: Risk for impaired skin integrity will decrease Outcome: Progressing   

## 2023-04-07 NOTE — Progress Notes (Signed)
  Progress Note   Patient: Hannah Khan ZOX:[id number] DOB: [birth year] DOA: 04/06/2023     0 DOS: the patient was seen and examined on 04/07/2023   Brief hospital course: 67yow presented w/ intractable n/v and abd wall cellulitis.  Assessment and Plan: Intractable nausea and vomiting  Presume viral gastroenteritis, without any evidence of acute bacterial infection such as leukocytosis, fever, or acute findings on abdominal imaging. Now resolved Advance diet   Abdominal wall cellulitis  Recently admitted at the end of June with sepsis due to cellulitis, completed a course of antibiotics in the hospital.  Started on Bactrim 7/17 at urgent care.  Currently no signs or symptoms of sepsis. Given clinical improvement will continue IV Rocephin   NASH cirrhosis -failed TIPS April 2024, continue Xifaxan, lactulose.   Appears stable   DM type 2 Diabetic diet   Moderate dose sliding scale insulin   Chronic diastolic CHF-continue appropriate home medications, no evidence of heart failure exacerbation   CKD stage IIIa   Overall appears stable    Hypokalemia  Replete   Chronic Afib -rate controlled, continue home digoxin, metoprolol, not on anticoagulation due to chronic thrombocytopenia   Pancytopenia  Chronic Anemia - stable, due to cirrhosis   Morbid obesity -BMI 53, complicating all aspects of care      Subjective:  Feels better, abd cellulitis is improving No n/v, wants food  Physical Exam: Vitals:   04/07/23 0517 04/07/23 0559 04/07/23 0914 04/07/23 1211  BP: (!) 107/41 (!) 107/42 (!) 129/48 (!) 133/49  Pulse: 86 90 68 86  Resp: 20 18  18   Temp: 97.8 F (36.6 C) 98.2 F (36.8 C)  98 F (36.7 C)  TempSrc: Oral Oral  Oral  SpO2: 97% 98%  98%  Weight:      Height:       Physical Exam Vitals reviewed.  Constitutional:      General: She is not in acute distress.    Appearance: She is not ill-appearing or toxic-appearing.  Cardiovascular:     Rate and Rhythm:  Normal rate and regular rhythm.     Heart sounds: No murmur heard. Pulmonary:     Effort: Pulmonary effort is normal. No respiratory distress.     Breath sounds: No wheezing, rhonchi or rales.  Abdominal:     General: There is no distension.     Palpations: Abdomen is soft.     Comments: Obese. Erythema left side noted, no warmth, no tenderness, no lesions, no fluctuance  Neurological:     Mental Status: She is alert.  Psychiatric:        Mood and Affect: Mood normal.        Behavior: Behavior normal.     Data Reviewed: CBG stable K+ 2.8, Mg was 1.7 yesterday Creatinine stable 1.9 AST stable 53 Tbili stable 4.9 Plts 2.5 Hgb 10.2 Plts 23  Family Communication: husband at bedside  Disposition: Status is: Inpatient     Time spent: 35 minutes  Author: Brendia Sacks, MD 04/07/2023 6:30 PM  For on call review [email or web address].

## 2023-04-07 NOTE — Hospital Course (Addendum)
67yow presented w/ intractable n/v and abd wall cellulitis.

## 2023-04-08 DIAGNOSIS — D61818 Other pancytopenia: Secondary | ICD-10-CM

## 2023-04-08 DIAGNOSIS — N1831 Chronic kidney disease, stage 3a: Secondary | ICD-10-CM | POA: Diagnosis not present

## 2023-04-08 DIAGNOSIS — E119 Type 2 diabetes mellitus without complications: Secondary | ICD-10-CM | POA: Diagnosis not present

## 2023-04-08 DIAGNOSIS — I482 Chronic atrial fibrillation, unspecified: Secondary | ICD-10-CM | POA: Diagnosis not present

## 2023-04-08 DIAGNOSIS — R112 Nausea with vomiting, unspecified: Secondary | ICD-10-CM | POA: Diagnosis not present

## 2023-04-08 DIAGNOSIS — Z6841 Body Mass Index (BMI) 40.0 and over, adult: Secondary | ICD-10-CM

## 2023-04-08 LAB — BASIC METABOLIC PANEL
Anion gap: 7 (ref 5–15)
BUN: 24 mg/dL — ABNORMAL HIGH (ref 8–23)
CO2: 28 mmol/L (ref 22–32)
Calcium: 8.1 mg/dL — ABNORMAL LOW (ref 8.9–10.3)
Chloride: 98 mmol/L (ref 98–111)
Creatinine, Ser: 1.3 mg/dL — ABNORMAL HIGH (ref 0.44–1.00)
GFR, Estimated: 45 mL/min — ABNORMAL LOW (ref >60)
Glucose, Bld: 112 mg/dL — ABNORMAL HIGH (ref 70–99)
Potassium: 3.3 mmol/L — ABNORMAL LOW (ref 3.5–5.1)
Sodium: 133 mmol/L — ABNORMAL LOW (ref 135–145)

## 2023-04-08 LAB — GLUCOSE, CAPILLARY
Glucose-Capillary: 105 mg/dL — ABNORMAL HIGH (ref 70–99)
Glucose-Capillary: 124 mg/dL — ABNORMAL HIGH (ref 70–99)

## 2023-04-08 LAB — MAGNESIUM: Magnesium: 1.9 mg/dL (ref 1.7–2.4)

## 2023-04-08 MED ORDER — POTASSIUM CHLORIDE CRYS ER 20 MEQ PO TBCR
40.0000 meq | EXTENDED_RELEASE_TABLET | Freq: Once | ORAL | Status: AC
  Administered 2023-04-08: 40 meq via ORAL
  Filled 2023-04-08: qty 2

## 2023-04-08 MED ORDER — CEFADROXIL 1 G PO TABS: 1.0000 g | ORAL_TABLET | Freq: Two times a day (BID) | ORAL | 0 refills | Status: AC

## 2023-04-08 NOTE — Progress Notes (Signed)
   04/08/23 0943  TOC Brief Assessment  Insurance and Status Reviewed  Patient has primary care physician Yes  Home environment has been reviewed Home w/ spouse  Prior level of function: Independent  Prior/Current Home Services No current home services  Social Determinants of Health Reivew SDOH reviewed no interventions necessary  Readmission risk has been reviewed Yes  Transition of care needs no transition of care needs at this time

## 2023-04-08 NOTE — Progress Notes (Signed)
Nurse reviewed discharge instructions with pt.  Pt verbalized understanding of discharge instructions, follow up appointments and new medications.  No concerns at time of discharge. 

## 2023-04-08 NOTE — Discharge Summary (Signed)
Physician Discharge Summary   Patient: Hannah Khan MRN: [id number] DOB: Jul 31, 1956  Admit date:     04/06/2023  Discharge date: 04/08/23  Discharge Physician: Brendia Sacks   PCP: Darrow Bussing, MD   Recommendations at discharge:   See below  Discharge Diagnoses: Principal Problem:   Intractable vomiting with nausea Active Problems:   Intractable nausea and vomiting Abdominal wall cellulitis NASH cirrhosis Diabetes mellitus type 2 Chronic atrial fibrillation CKD stage IIIa Pancytopenia Morbid obesity  Resolved Problems:   * No resolved hospital problems. [facility] Course: 67yow presented w/ intractable n/v and abd wall cellulitis.  Nausea and vomiting spontaneously resolved and rapidly.  Abdominal cellulitis rapidly improved with empiric antibiotics.  Discharged home in good condition.  Intractable nausea and vomiting  Presume viral gastroenteritis, without any evidence of acute bacterial infection such as leukocytosis, fever, or acute findings on abdominal imaging. Resolved spontaneously   Abdominal wall cellulitis  Recently admitted at the end of June with sepsis due to cellulitis, completed a course of antibiotics in the hospital.  Started on Bactrim 7/17 at urgent care.  Currently no signs or symptoms of sepsis. Appears resolved on Rocephin. Short course of outpatient antibiotics.  Stop Bactrim, especially in light of renal function.   NASH cirrhosis -failed TIPS April 2024, continue Xifaxan, lactulose.   Stable   DM type 2 Diabetic diet   Able   Chronic diastolic CHF-continue appropriate home medications, no evidence of heart failure exacerbation   CKD stage IIIa   Overall appears stable    Hypokalemia  Repleted   Chronic Afib -rate controlled, continue home digoxin, metoprolol, not on anticoagulation due to chronic thrombocytopenia   Pancytopenia  Chronic Anemia - stable, due to cirrhosis   Morbid obesity -BMI 53, complicating all aspects of  care       Consultants:  None  Procedures performed:  None   Disposition: Home Diet recommendation:  Cardiac and Carb modified diet DISCHARGE MEDICATION: Allergies as of 04/08/2023       Reactions   Celecoxib Other (See Comments)   "speech slurred" with celebrex   Shellfish Allergy Swelling, Rash   TINGLING AND SWELLING OF LIPS. LARGE WHELPS QUARTER-SIZE   Barium-containing Compounds Other (See Comments)   TACHYCARDIA   Prednisone Other (See Comments)   TACHYCARDIA   Statins Other (See Comments)   AGGRAVATED FIBROMYALGIA   Fentanyl    Hallucinations    Ibuprofen Other (See Comments)   UNSPECIFIED SPECIFIC REACTION >> "DUE TO PLATELETS"   Sglt2 Inhibitors    Cellulitis of pannus   Tramadol    Hallucination   Tylenol [acetaminophen] Other (See Comments)   UNSPECIFIED SPECIFIC REACTION >> "DUE TO PLATELETS"   Zetia [ezetimibe]    Headache        Medication List     TAKE these medications    albuterol 108 (90 Base) MCG/ACT inhaler Commonly known as: VENTOLIN HFA Inhale 1-2 puffs into the lungs every 6 (six) hours as needed for wheezing or shortness of breath.   cefadroxil 1 g tablet Commonly known as: DURICEF Take 1 tablet (1 g total) by mouth 2 (two) times daily for 5 days.   diclofenac Sodium 1 % Gel Commonly known as: VOLTAREN Apply 1 Application topically at bedtime as needed (pain).   digoxin 0.25 MG tablet Commonly known as: LANOXIN Take 1 tablet (0.25 mg total) by mouth daily.   diltiazem 180 MG 24 hr capsule Commonly known as: CARDIZEM CD TAKE 1 CAPSULE BY MOUTH EVERY  DAY What changed:  how much to take when to take this   lactulose 10 GM/15ML solution Commonly known as: CHRONULAC Take 10 g by mouth 3 (three) times daily.   metolazone 5 MG tablet Commonly known as: ZAROXOLYN Take 1 tablet (5 mg total) by mouth once a week. Take one tablet on Sundays 30 minutes before Bumex   metoprolol tartrate 25 MG tablet Commonly known as:  LOPRESSOR Take 1 tablet (25 mg total) by mouth 2 (two) times daily.   midodrine 10 MG tablet Commonly known as: PROAMATINE Take 1 tablet (10 mg total) by mouth 2 (two) times daily with a meal.   OneTouch Delica Plus Lancet33G Misc Apply topically 3 (three) times daily.   OneTouch Verio test strip Generic drug: glucose blood 1 each 3 (three) times daily.   Ozempic (2 MG/DOSE) 8 MG/3ML Sopn Generic drug: Semaglutide (2 MG/DOSE) Inject 2 mg into the skin once a week. What changed: when to take this   potassium chloride 10 MEQ CR capsule Commonly known as: MICRO-K Take 40 mEq ( 4 tablets)  by mouth twice per day   PreserVision AREDS 2 Caps Take 1 capsule by mouth 2 (two) times daily.   Systane Balance 0.6 % Soln Generic drug: Propylene Glycol Place 1 drop into both eyes 2 (two) times daily.   torsemide 20 MG tablet Commonly known as: DEMADEX Take 1 tablet (20 mg total) by mouth 2 (two) times daily.   Xifaxan 550 MG Tabs tablet Generic drug: rifaximin Take 550 mg by mouth 2 (two) times daily.        Follow-up Information     Koirala, Dibas, MD. Call .   Specialty: Family Medicine Why: Follow up from ER visit Contact information: [address] [place] [address] [phone]         [facility] Emergency Department at [facility]. Go to .   Specialty: Emergency Medicine Why: As needed, If symptoms worsen Contact information: [address] [place] [address] [phone]               Feels better Tolerating diet No vomiting Redness and heat on abdomen have resolved  Discharge Exam: Filed Weights   04/06/23 0343  Weight: (!) 153.3 kg   Physical Exam Vitals reviewed.  Constitutional:      General: She is not in acute distress.    Appearance: She is not ill-appearing or toxic-appearing.  Cardiovascular:     Rate and Rhythm: Normal rate and regular rhythm.     Heart sounds: No murmur  heard. Pulmonary:     Effort: Pulmonary effort is normal. No respiratory distress.     Breath sounds: No wheezing, rhonchi or rales.  Abdominal:     General: There is no distension.     Palpations: Abdomen is soft.     Tenderness: There is no abdominal tenderness.  Skin:    Comments: No abdominal wall erythema noted  Neurological:     Mental Status: She is alert.  Psychiatric:        Mood and Affect: Mood normal.        Behavior: Behavior normal.      Condition at discharge: good  The results of significant diagnostics from this hospitalization (including imaging, microbiology, ancillary and laboratory) are listed below for reference.   Imaging Studies: CT ABDOMEN PELVIS W CONTRAST  Result Date: 04/06/2023 CLINICAL DATA:  Abdominal pain with nausea and vomiting. History of cirrhosis. EXAM:  CT ABDOMEN AND PELVIS WITH CONTRAST TECHNIQUE: Multidetector CT imaging of the abdomen and pelvis was performed using the standard protocol following bolus administration of intravenous contrast. RADIATION DOSE REDUCTION: This exam was performed according to the departmental dose-optimization program which includes automated exposure control, adjustment of the mA and/or kV according to patient size and/or use of iterative reconstruction technique. CONTRAST:  OMNIPAQUE IOHEXOL 300 MG/ML  SOLN COMPARISON:  09/30/2022 FINDINGS: Lower chest: Tiny pleural effusions with some mild basilar atelectasis. Paraesophageal varices evident. Hepatobiliary: Cirrhotic liver morphology. No focal mass lesion evident within the liver parenchyma. Cholecystectomy. No intrahepatic or extrahepatic biliary dilation. Pancreas: No focal mass lesion. No dilatation of the main duct. No intraparenchymal cyst. No peripancreatic edema. Spleen: Splenomegaly as before. Adrenals/Urinary Tract: No adrenal nodule or mass. Tiny well-defined homogeneous low-density lesions in both kidneys are too small to characterize but are  statistically most likely benign and probably cysts. No followup imaging is recommended. No evidence for hydroureter. The urinary bladder appears normal for the degree of distention. Stomach/Bowel: Stomach is unremarkable. No gastric wall thickening. No evidence of outlet obstruction. Gastric varices evident. Duodenum is normally positioned as is the ligament of Treitz. No small bowel wall thickening. No small bowel dilatation. Right hemicolectomy. No gross colonic mass. No colonic wall thickening. Vascular/Lymphatic: There is mild atherosclerotic calcification of the abdominal aorta without aneurysm. Chronic portal vein occlusion again noted with cavernous transformation. Splenic vein patent. Extensive collateralization noted in the upper abdomen. There is no gastrohepatic or hepatoduodenal ligament lymphadenopathy. No retroperitoneal or mesenteric lymphadenopathy. No pelvic sidewall lymphadenopathy. Reproductive: IUD is low as before with the stem appearing to protrude into the cervical stroma. There is no adnexal mass. Other: No substantial intraperitoneal free fluid. Body wall edema evident. Skin thickening and subcutaneous stranding in the lower anterior abdominal wall may be related to edema although cellulitis could have this appearance. Musculoskeletal: No worrisome lytic or sclerotic osseous abnormality. IMPRESSION: 1. No acute findings in the abdomen or pelvis. Specifically, no findings to explain the patient's history of abdominal pain with nausea and vomiting. 2. Cirrhotic liver morphology with sequelae of portal venous hypertension including splenomegaly and extensive collateralization in the upper abdomen with paraesophageal and gastric varices. 3. Chronic portal vein occlusion with cavernous transformation. 4. Tiny pleural effusions with mild basilar atelectasis. 5. Body wall edema. Skin thickening and subcutaneous stranding in the lower anterior abdominal wall may be related to edema although  cellulitis could have this appearance. 6.  Aortic Atherosclerosis (ICD10-I70.0). Electronically Signed   By: Kennith Center M.D.   On: 04/06/2023 05:46   MM 3D SCREENING MAMMOGRAM BILATERAL BREAST  Result Date: 03/16/2023 CLINICAL DATA:  Screening. EXAM: DIGITAL SCREENING BILATERAL MAMMOGRAM WITH TOMOSYNTHESIS AND CAD TECHNIQUE: Bilateral screening digital craniocaudal and mediolateral oblique mammograms were obtained. Bilateral screening digital breast tomosynthesis was performed. The images were evaluated with computer-aided detection. COMPARISON:  Previous exam(s). ACR Breast Density Category a: The breasts are almost entirely fatty. FINDINGS: There are no findings suspicious for malignancy. IMPRESSION: No mammographic evidence of malignancy. A result letter of this screening mammogram will be mailed directly to the patient. RECOMMENDATION: Screening mammogram in one year. (Code:[id number]-B-01Y) BI-RADS CATEGORY  1: Negative. Electronically Signed   By: Edwin Cap M.D.   On: 03/16/2023 15:16    Microbiology: Results for orders placed or performed during the hospital encounter of 03/05/23  Blood Culture (routine x 2)     Status: None   Collection Time: 03/05/23  3:49 PM   Specimen:  BLOOD  Result Value Ref Range Status   Specimen Description BLOOD BLOOD RIGHT HAND  Final   Special Requests   Final    BOTTLES DRAWN AEROBIC AND ANAEROBIC Blood Culture results may not be optimal due to an inadequate volume of blood received in culture bottles   Culture   Final    NO GROWTH 5 DAYS Performed at [facility], [address], [place], [place] [address]    Report Status 03/10/2023 FINAL  Final  Blood Culture (routine x 2)     Status: Abnormal   Collection Time: 03/05/23  4:34 PM   Specimen: BLOOD  Result Value Ref Range Status   Specimen Description BLOOD SITE NOT SPECIFIED  Final   Special Requests   Final    BOTTLES DRAWN AEROBIC ONLY Blood Culture adequate volume   Culture  Setup Time    Final    GRAM POSITIVE COCCI IN CLUSTERS AEROBIC BOTTLE ONLY CRITICAL RESULT CALLED TO, READ BACK BY AND VERIFIED WITH: PHARMD J. LEDFORD 03/07/23 @ 0424 BY AB    Culture (A)  Final    STAPHYLOCOCCUS EPIDERMIDIS THE SIGNIFICANCE OF ISOLATING THIS ORGANISM FROM A SINGLE SET OF BLOOD CULTURES WHEN MULTIPLE SETS ARE DRAWN IS UNCERTAIN. PLEASE NOTIFY THE MICROBIOLOGY DEPARTMENT WITHIN ONE WEEK IF SPECIATION AND SENSITIVITIES ARE REQUIRED. Performed at [facility], [address] [place], [place] [address]    Report Status 03/08/2023 FINAL  Final  Blood Culture ID Panel (Reflexed)     Status: Abnormal   Collection Time: 03/05/23  4:34 PM  Result Value Ref Range Status   Enterococcus faecalis NOT DETECTED NOT DETECTED Final   Enterococcus Faecium NOT DETECTED NOT DETECTED Final   Listeria monocytogenes NOT DETECTED NOT DETECTED Final   Staphylococcus species DETECTED (A) NOT DETECTED Final    Comment: CRITICAL RESULT CALLED TO, READ BACK BY AND VERIFIED WITH: PHARMD J. LEDFORD 03/07/23 @ 0424 BY AB    Staphylococcus aureus (BCID) NOT DETECTED NOT DETECTED Final   Staphylococcus epidermidis DETECTED (A) NOT DETECTED Final    Comment: Methicillin (oxacillin) resistant coagulase negative staphylococcus. Possible blood culture contaminant (unless isolated from more than one blood culture draw or clinical case suggests pathogenicity). No antibiotic treatment is indicated for blood  culture contaminants. CRITICAL RESULT CALLED TO, READ BACK BY AND VERIFIED WITH: PHARMD J. LEDFORD 03/07/23 @ 0424 BY AB    Staphylococcus lugdunensis NOT DETECTED NOT DETECTED Final   Streptococcus species NOT DETECTED NOT DETECTED Final   Streptococcus agalactiae NOT DETECTED NOT DETECTED Final   Streptococcus pneumoniae NOT DETECTED NOT DETECTED Final   Streptococcus pyogenes NOT DETECTED NOT DETECTED Final   A.calcoaceticus-baumannii NOT DETECTED NOT DETECTED Final   Bacteroides fragilis NOT DETECTED NOT  DETECTED Final   Enterobacterales NOT DETECTED NOT DETECTED Final   Enterobacter cloacae complex NOT DETECTED NOT DETECTED Final   Escherichia coli NOT DETECTED NOT DETECTED Final   Klebsiella aerogenes NOT DETECTED NOT DETECTED Final   Klebsiella oxytoca NOT DETECTED NOT DETECTED Final   Klebsiella pneumoniae NOT DETECTED NOT DETECTED Final   Proteus species NOT DETECTED NOT DETECTED Final   Salmonella species NOT DETECTED NOT DETECTED Final   Serratia marcescens NOT DETECTED NOT DETECTED Final   Haemophilus influenzae NOT DETECTED NOT DETECTED Final   Neisseria meningitidis NOT DETECTED NOT DETECTED Final   Pseudomonas aeruginosa NOT DETECTED NOT DETECTED Final   Stenotrophomonas maltophilia NOT DETECTED NOT DETECTED Final   Candida albicans NOT DETECTED NOT DETECTED Final  Candida auris NOT DETECTED NOT DETECTED Final   Candida glabrata NOT DETECTED NOT DETECTED Final   Candida krusei NOT DETECTED NOT DETECTED Final   Candida parapsilosis NOT DETECTED NOT DETECTED Final   Candida tropicalis NOT DETECTED NOT DETECTED Final   Cryptococcus neoformans/gattii NOT DETECTED NOT DETECTED Final   Methicillin resistance mecA/C DETECTED (A) NOT DETECTED Final    Comment: CRITICAL RESULT CALLED TO, READ BACK BY AND VERIFIED WITH: PHARMD J. LEDFORD 03/07/23 @ 0424 BY AB Performed at [facility], [address], [place], [place] [address]     Labs: CBC: Recent Labs  Lab 04/06/23 0354 04/07/23 0559  WBC 6.2 2.5*  NEUTROABS 5.6  --   HGB 11.5* 10.2*  HCT 33.3* 31.0*  MCV 96.8 101.3*  PLT 23* 23*   Basic Metabolic Panel: Recent Labs  Lab 04/06/23 0354 04/06/23 1238 04/07/23 0559 04/07/23 1828 04/08/23 0541  NA 135  --  133* 135 133*  K 2.7*  --  2.8* 2.8* 3.3*  CL 96*  --  98 97* 98  CO2 26  --  25 31 28   GLUCOSE 163*  --  102* 147* 112*  BUN 21  --  23 23 24*  CREATININE 1.32*  --  1.39* 1.37* 1.30*  CALCIUM 8.8*  --  7.9* 8.4* 8.1*  MG  --  1.7  --   --  1.9    Liver Function Tests: Recent Labs  Lab 04/06/23 0354 04/07/23 0559  AST 53* 53*  ALT 26 28  ALKPHOS 87 77  BILITOT 6.4* 4.9*  PROT 6.2* 5.7*  ALBUMIN 2.8* 2.5*   CBG: Recent Labs  Lab 04/07/23 1137 04/07/23 1733 04/07/23 2037 04/08/23 0739 04/08/23 1147  GLUCAP 118* 160* 97 105* 124*    Discharge time spent: less than 30 minutes.  Signed: Brendia Sacks, MD [facility] 04/08/2023

## 2023-04-11 DIAGNOSIS — M25561 Pain in right knee: Secondary | ICD-10-CM | POA: Diagnosis not present

## 2023-04-11 DIAGNOSIS — M25512 Pain in left shoulder: Secondary | ICD-10-CM | POA: Diagnosis not present

## 2023-04-14 ENCOUNTER — Ambulatory Visit (HOSPITAL_BASED_OUTPATIENT_CLINIC_OR_DEPARTMENT_OTHER): Payer: Medicare PPO | Admitting: Family

## 2023-04-14 DIAGNOSIS — Z6841 Body Mass Index (BMI) 40.0 and over, adult: Secondary | ICD-10-CM | POA: Diagnosis not present

## 2023-04-14 DIAGNOSIS — R066 Hiccough: Secondary | ICD-10-CM | POA: Diagnosis not present

## 2023-04-14 DIAGNOSIS — K7460 Unspecified cirrhosis of liver: Secondary | ICD-10-CM | POA: Diagnosis not present

## 2023-04-14 DIAGNOSIS — I5032 Chronic diastolic (congestive) heart failure: Secondary | ICD-10-CM | POA: Diagnosis not present

## 2023-04-14 DIAGNOSIS — R112 Nausea with vomiting, unspecified: Secondary | ICD-10-CM | POA: Diagnosis not present

## 2023-04-14 DIAGNOSIS — D696 Thrombocytopenia, unspecified: Secondary | ICD-10-CM | POA: Diagnosis not present

## 2023-04-17 LAB — LAB REPORT - SCANNED: EGFR: 50

## 2023-04-18 ENCOUNTER — Encounter (HOSPITAL_BASED_OUTPATIENT_CLINIC_OR_DEPARTMENT_OTHER): Payer: Self-pay | Admitting: Family

## 2023-04-18 ENCOUNTER — Ambulatory Visit (HOSPITAL_BASED_OUTPATIENT_CLINIC_OR_DEPARTMENT_OTHER): Payer: Medicare PPO | Admitting: Family

## 2023-04-18 VITALS — BP 90/48 | HR 88 | Ht 67.0 in | Wt 348.0 lb

## 2023-04-18 DIAGNOSIS — E876 Hypokalemia: Secondary | ICD-10-CM

## 2023-04-18 DIAGNOSIS — K7460 Unspecified cirrhosis of liver: Secondary | ICD-10-CM | POA: Diagnosis not present

## 2023-04-18 DIAGNOSIS — K7581 Nonalcoholic steatohepatitis (NASH): Secondary | ICD-10-CM

## 2023-04-18 DIAGNOSIS — I959 Hypotension, unspecified: Secondary | ICD-10-CM

## 2023-04-18 DIAGNOSIS — I350 Nonrheumatic aortic (valve) stenosis: Secondary | ICD-10-CM | POA: Diagnosis not present

## 2023-04-18 DIAGNOSIS — I4821 Permanent atrial fibrillation: Secondary | ICD-10-CM

## 2023-04-18 DIAGNOSIS — I10 Essential (primary) hypertension: Secondary | ICD-10-CM

## 2023-04-18 DIAGNOSIS — R5381 Other malaise: Secondary | ICD-10-CM | POA: Diagnosis not present

## 2023-04-18 DIAGNOSIS — I5032 Chronic diastolic (congestive) heart failure: Secondary | ICD-10-CM

## 2023-04-18 MED ORDER — POTASSIUM CHLORIDE ER 10 MEQ PO CPCR
ORAL_CAPSULE | ORAL | 11 refills | Status: DC
Start: 2023-04-18 — End: 2023-04-28

## 2023-04-18 MED ORDER — MIDODRINE HCL 10 MG PO TABS
10.0000 mg | ORAL_TABLET | Freq: Three times a day (TID) | ORAL | 2 refills | Status: DC
Start: 2023-04-18 — End: 2024-05-29

## 2023-04-18 MED ORDER — TORSEMIDE 20 MG PO TABS
20.0000 mg | ORAL_TABLET | Freq: Two times a day (BID) | ORAL | 1 refills | Status: DC
Start: 2023-04-18 — End: 2023-04-28

## 2023-04-18 MED ORDER — DILTIAZEM HCL ER COATED BEADS 180 MG PO CP24
180.0000 mg | ORAL_CAPSULE | Freq: Every day | ORAL | 1 refills | Status: DC
Start: 2023-04-18 — End: 2023-07-11

## 2023-04-18 NOTE — Progress Notes (Signed)
Cardiology Office Note:  .   Date:  04/23/2023  ID:  Hannah Khan, DOB 1955-09-21, MRN [id number] PCP: Darrow Bussing, MD  [facility] Providers Cardiologist:  Chilton Si, MD    History of Present Illness: .   Hannah Khan is a 67 y.o. female  with a hx of chronic diastolic heart failure, morbid obesity, OSA, NASH/cirrhosis, chronic thrombocytopenia, DM2, aortic stenosis, vasovagal presyncope, chronic atrial fibrillation not on anticoagulation.   ED visit 02/2020 with atrial fibrillation requiring diltiazem gtt. She saw Dr. Ladona Ridgel 07/2020 and decided upon medical management as she was overall asymptomatic.    Admitted 01/2021 with atrial fibrillation and hepatic encephalopathy.  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 Admission 03/2021 with acute on chronic diastolic heart failure suspected due to dietary indiscretion. Hannah Khan was started. Bumex 2mg  daily continued with Metolazone PRN for weight gain. She did have episode of vasovagal presyncope associated with IV magnesium with CT head and neurology consult unremarkable.   Spironolactone previously trialed but had to be discontinued due to hypotension.  Her diltiazem has been gradually reduced due to hypotension.  Bumex and metolazone have been escalated.     Admitted 2/25-2/27/24 with cellulitis/epsis treated with antibiotics.    Seen 12/12/2022.  At that time she noted morning BP as low as 80s over 50s associate with nausea.  Subsequent ammonia levels unremarkable.  She was often self adjusting her Bumex.  She was encouraged to take Bumex, metolazone as prescribed.    She was to undergo TIPS 12/23/2022 but procedure had to be aborted due to right atrial pressure of 23 mmHg.    Seen 12/29/2022.  She continued to self adjust her diuretics to urinary frequency.  Recommended for metolazone 5 mg twice per week and Bumex 2 mg daily.  At visit 01/12/2023 weight was down 6 pounds.  Metolazone  twice per week and Bumex 2 mg daily were continued. Last seen in clinic 02/14/23 with lowest clinic weight in 1 year of 324 lbs. Echo ordered for 03/07/23 to reassess RA pressure prior to proceeding with TIPS with Dr. Elby Showers.   Admitted 6/16 - 03/12/2023 with abdominal wall cellulitis with sepsis treated with antibiotics.  Due to hypotension she was started on midodrine.  Required diuresis of 8.4 L.  Echo 03/07/2023 LVEF 55 to 60%, no RWMA, bilateral atria severely dilated, moderate TR, mild to moderate AS.  Discharged on torsemide 20 mg twice daily.  Jardiance discontinued due to recurrent cellulitis. Discharge weight 338 lbs. WEight at home on discharged 339 lbs.   Admitted 7/18-7/20/24 with viral gastroenteritis which resolved spontaneously.  No evidence of heart failure exacerbation. Weight at home on 04/09/23 was 349.8 lbs.   Presents today for follow up with her husband. Spironolactone held during admission. Weight today up 11 pounds from hospital discharge. Her prior weight in clinic 02/14/23 was 324 lbs and now 348 lbs by home scale. Declines to weight on clinic scale today. PCP has stopped Ozempic due to nausea, constipation even with lactulose, frequent hiccups. Taking four potassium tablets twice per day with persistent hypokalemia (04/07/23 K 3.3, creatinine 1.3). Drinking one Coke Zero daily, water, and chewing on ice. Reiterated 2L fluid restriction. She had a sandwich and low sodium soup yesterday to eat, anticipate some dietary indiscretion. She is taking Metolazone weekly on Sundays. Weight at home 7/26 341 ? 7/30 348.  04/07/23 labs ALT 28, AST 53, creatinine 1.3, Hb 10.2, potassium 3.3  ROS: Please see the history of present illness.    All other systems reviewed and are negative.   Studies Reviewed: .        Cardiac Studies & Procedures       ECHOCARDIOGRAM  ECHOCARDIOGRAM COMPLETE 03/07/2023  Narrative ECHOCARDIOGRAM REPORT    Patient Name:   Hannah Khan Date of  Exam: 03/07/2023 Medical Rec #:  [id number]       Height:       67.0 in Accession #:    [id number]      Weight:       345.5 lb Date of Birth:  15-May-1956       BSA:          2.551 m Patient Age:    67 years        BP:           112/59 mmHg Patient Gender: F               HR:           83 bpm. Exam Location:  Inpatient  Procedure: 2D Echo, Cardiac Doppler, Color Doppler and Intracardiac Opacification Agent  Indications:    CHF- Diastolic  History:        Patient has prior history of Echocardiogram examinations, most recent 11/01/2022. CHF, Arrythmias:Atrial Fibrillation, Signs/Symptoms:Shortness of Breath and Hypotension; Risk Factors:Sleep Apnea, Diabetes, Hypertension and Morbid obesity. Liver cirrhosis secondary to NASH, portal HTN.  Sonographer:    Milda Smart Referring Phys: [phone] A CALDWELL POWELL JR   Sonographer Comments: Patient is obese and Technically difficult study due to poor echo windows. Image acquisition challenging due to patient body habitus and Image acquisition challenging due to respiratory motion. IMPRESSIONS   1. Left ventricular ejection fraction, by estimation, is 55 to 60%. The left ventricle has normal function. The left ventricle has no regional wall motion abnormalities. Left ventricular diastolic function could not be evaluated. 2. Right ventricular systolic function is normal. The right ventricular size is normal. There is mildly elevated pulmonary artery systolic pressure. The estimated right ventricular systolic pressure is 39.6 mmHg. 3. Left atrial size was severely dilated. 4. Right atrial size was severely dilated. 5. The mitral valve is normal in structure. No evidence of mitral valve regurgitation. 6. Tricuspid valve regurgitation is moderate. 7. The aortic valve is tricuspid. There is moderate calcification of the aortic valve. There is moderate thickening of the aortic valve. Aortic valve regurgitation is not visualized. Mild to moderate  aortic valve stenosis. 8. The inferior vena cava is dilated in size with <50% respiratory variability, suggesting right atrial pressure of 15 mmHg.  Comparison(s): No significant change from prior study. Prior images reviewed side by side.  FINDINGS Left Ventricle: Left ventricular ejection fraction, by estimation, is 55 to 60%. The left ventricle has normal function. The left ventricle has no regional wall motion abnormalities. Definity contrast agent was given IV to delineate the left ventricular endocardial borders. The left ventricular internal cavity size was normal in size. There is no left ventricular hypertrophy. Left ventricular diastolic function could not be evaluated due to atrial fibrillation. Left ventricular diastolic function could not be evaluated.  Right Ventricle: The right ventricular size is normal. No increase in right ventricular wall thickness. Right ventricular systolic function is normal. There is mildly elevated pulmonary artery systolic pressure. The tricuspid regurgitant velocity is 2.48 m/s, and with an assumed right atrial pressure of 15 mmHg, the estimated right ventricular systolic pressure is 39.6 mmHg.  Left Atrium: Left atrial size was severely dilated.  Right Atrium: Right atrial size was severely dilated.  Pericardium: There is no evidence of pericardial effusion.  Mitral Valve: The mitral valve is normal in structure. No evidence of mitral valve regurgitation.  Tricuspid Valve: The tricuspid valve is grossly normal. Tricuspid valve regurgitation is moderate.  Aortic Valve: The aortic valve is tricuspid. There is moderate calcification of the aortic valve. There is moderate thickening of the aortic valve. Aortic valve regurgitation is not visualized. Mild to moderate aortic stenosis is present. Aortic valve mean gradient measures 15.0 mmHg. Aortic valve peak gradient measures 21.4 mmHg.  Pulmonic Valve: The pulmonic valve was not well visualized.  Pulmonic valve regurgitation is not visualized. No evidence of pulmonic stenosis.  Aorta: The aortic root and ascending aorta are structurally normal, with no evidence of dilitation.  Venous: The inferior vena cava is dilated in size with less than 50% respiratory variability, suggesting right atrial pressure of 15 mmHg.  IAS/Shunts: No atrial level shunt detected by color flow Doppler.   LEFT VENTRICLE PLAX 2D LVIDd:         5.00 cm   Diastology LVIDs:         3.70 cm   LV e' medial:    9.90 cm/s LV PW:         0.90 cm   LV E/e' medial:  14.6 LV IVS:        0.90 cm   LV e' lateral:   9.36 cm/s LVOT diam:     2.20 cm   LV E/e' lateral: 15.5 LVOT Area:     3.80 cm   RIGHT VENTRICLE RV S prime:     11.10 cm/s TAPSE (M-mode): 2.0 cm  LEFT ATRIUM              Index        RIGHT ATRIUM           Index LA diam:        5.70 cm  2.23 cm/m   RA Area:     27.70 cm LA Vol (A2C):   108.0 ml 42.33 ml/m  RA Volume:   85.40 ml  33.47 ml/m LA Vol (A4C):   100.0 ml 39.20 ml/m LA Biplane Vol: 105.0 ml 41.16 ml/m AORTIC VALVE AV Vmax:      231.50 cm/s AV Vmean:     185.000 cm/s AV VTI:       0.616 m AV Peak Grad: 21.4 mmHg AV Mean Grad: 15.0 mmHg  AORTA Ao Root diam: 2.80 cm Ao Asc diam:  3.60 cm  MITRAL VALVE                TRICUSPID VALVE MV Area (PHT): 3.95 cm     TR Peak grad:   24.6 mmHg MV Decel Time: 192 msec     TR Mean grad:   14.0 mmHg MV E velocity: 145.00 cm/s  TR Vmax:        248.00 cm/s TR Vmean:       177.0 cm/s  SHUNTS Systemic Diam: 2.20 cm  Mihai Croitoru MD Electronically signed by Thurmon Fair MD Signature Date/Time: 03/07/2023/2:58:19 PM    Final             Risk Assessment/Calculations:    CHA2DS2-VASc Score = 3   This indicates a 3.2% annual risk of stroke. [name] score is based upon: CHF History: 0 HTN History: 1 Diabetes History: 0 Stroke History: 0 Vascular Disease  History: 0 Age Score: 1 Gender Score: 1         Physical  Exam:   VS:  BP (!) 90/48   Pulse 88   Ht 5\' 7"  (1.702 m)   Wt (!) 348 lb (157.9 kg)   BMI 54.50 kg/m    Wt Readings from Last 3 Encounters:  04/18/23 (!) 348 lb (157.9 kg)  04/06/23 (!) 338 lb (153.3 kg)  03/12/23 (!) 338 lb 11.2 oz (153.6 kg)    GEN: Well nourished, overweight, well developed in no acute distress NECK: No JVD; No carotid bruits CARDIAC: IRIR, no murmurs, rubs, gallops RESPIRATORY:  Clear to auscultation without rales, wheezing or rhonchi  ABDOMEN: Soft, non-tender, non-distended EXTREMITIES:  No edema; No deformity   ASSESSMENT AND PLAN: .    Aortic stenosis - Echo 03/07/23 LVEF 55-60%, mild to moderate AS. Continue optimal BP control.   Permanent atrial fibrillation- No OAC due to cirrhosis. Rate controlled today on Diltaizem 180mg  every day, Metoprolol tartrate 25mg  BID.   Chronic diastolic heart failure- Bumex transitioned to Torsemide during recent admission. Volume overloaded with weight up 7 pounds over the last 4 days by her home scale. Declines to weight today. Presently taking Metolazone weekly on Sundays. Will have her take an additional dose tomorrow 30 minutes prior to her Torsemide. Will refer to [facility] due to persistently difficulty controlling volume status.  Low sodium diet, fluid restriction <2L, and daily weights encouraged. Educated to contact our office for weight gain of 2 lbs overnight or 5 lbs in one week.   Hypokalemia - 04/08/23 K 3.3. 04/14/23 K 3.0. Presently taking four potassium twice per day. Spironolactone stopped during recent admission due to hypotension. Will increase Potassium to 40mg  AM, 20mg  lunch, 40mg  PM.   Hypotension - Persistently hypotensive. Will increase Midodrine to 10mg  TID.   Physical deconditioning - Refer to PT, hopeful to participate in water PT.   Cirrhosis- Follows with GI, PCP. Matinained on Lactulose.   Aortic atherosclerosis / HLD - Unable to utilize statin due to cirrhosis. Not  addressed at this clinic visit, readdress at follow up. No anginal symptoms.         Dispo: follow up in 3 months  Signed, Alver Sorrow, NP

## 2023-04-18 NOTE — Patient Instructions (Addendum)
Medication Instructions:  Your physician has recommended you make the following change in your medication:   Change: Midodrine three times daily   Increase: Potassium 40mg  in the morrning, at lunch, and in the evening   Take an extra dose of metolazone tomorrow 7/31   *If you need a refill on your cardiac medications before your next appointment, please call your pharmacy*   Lab Work: Your physician recommends that you return for lab work in one to two weeks- BMP    Follow-Up: At [facility], you and your health needs are our priority.  As part of our continuing mission to provide you with exceptional heart care, we have created designated Provider Care Teams.  These Care Teams include your primary Cardiologist (physician) and Advanced Practice Providers (APPs -  Physician Assistants and Nurse Practitioners) who all work together to provide you with the care you need, when you need it.  We recommend signing up for the patient portal called "MyChart".  Sign up information is provided on this After Visit Summary.  MyChart is used to connect with patients for Virtual Visits (Telemedicine).  Patients are able to view lab/test results, encounter notes, upcoming appointments, etc.  Non-urgent messages can be sent to your provider as well.   To learn more about what you can do with MyChart, go to [email or web address].    Your next appointment:   Follow up with Heart Failure   &   3 months with Dr. Duke Salvia or Gillian Shields, NP

## 2023-04-23 ENCOUNTER — Encounter (HOSPITAL_BASED_OUTPATIENT_CLINIC_OR_DEPARTMENT_OTHER): Payer: Self-pay | Admitting: Family

## 2023-04-26 DIAGNOSIS — K7460 Unspecified cirrhosis of liver: Secondary | ICD-10-CM | POA: Diagnosis not present

## 2023-04-26 DIAGNOSIS — R112 Nausea with vomiting, unspecified: Secondary | ICD-10-CM | POA: Diagnosis not present

## 2023-04-27 ENCOUNTER — Encounter: Payer: Self-pay | Admitting: Physical Therapy

## 2023-04-27 ENCOUNTER — Ambulatory Visit: Payer: Medicare PPO | Attending: Family | Admitting: Physical Therapy

## 2023-04-27 ENCOUNTER — Other Ambulatory Visit: Payer: Self-pay

## 2023-04-27 DIAGNOSIS — I959 Hypotension, unspecified: Secondary | ICD-10-CM | POA: Diagnosis not present

## 2023-04-27 DIAGNOSIS — I350 Nonrheumatic aortic (valve) stenosis: Secondary | ICD-10-CM | POA: Diagnosis not present

## 2023-04-27 DIAGNOSIS — K7581 Nonalcoholic steatohepatitis (NASH): Secondary | ICD-10-CM | POA: Diagnosis not present

## 2023-04-27 DIAGNOSIS — I10 Essential (primary) hypertension: Secondary | ICD-10-CM | POA: Diagnosis not present

## 2023-04-27 DIAGNOSIS — R2689 Other abnormalities of gait and mobility: Secondary | ICD-10-CM | POA: Insufficient documentation

## 2023-04-27 DIAGNOSIS — M6281 Muscle weakness (generalized): Secondary | ICD-10-CM | POA: Diagnosis not present

## 2023-04-27 DIAGNOSIS — I5032 Chronic diastolic (congestive) heart failure: Secondary | ICD-10-CM | POA: Insufficient documentation

## 2023-04-27 DIAGNOSIS — E876 Hypokalemia: Secondary | ICD-10-CM | POA: Insufficient documentation

## 2023-04-27 DIAGNOSIS — I4821 Permanent atrial fibrillation: Secondary | ICD-10-CM | POA: Insufficient documentation

## 2023-04-27 DIAGNOSIS — R2681 Unsteadiness on feet: Secondary | ICD-10-CM | POA: Insufficient documentation

## 2023-04-27 DIAGNOSIS — K7460 Unspecified cirrhosis of liver: Secondary | ICD-10-CM | POA: Insufficient documentation

## 2023-04-27 DIAGNOSIS — R5381 Other malaise: Secondary | ICD-10-CM | POA: Diagnosis not present

## 2023-04-27 NOTE — Progress Notes (Signed)
ADVANCED HEART FAILURE CLINIC NOTE  Referring Physician: Alver Sorrow, NP  Primary Care: Darrow Bussing, MD Primary Cardiologist: Chilton Si, MD  HPI: Hannah Khan is a 67 y.o. female with HFpEF, morbid obesity, obstructive sleep apnea, NASH/cirrhosis, thrombocytopenia, type 2 diabetes, aortic stenosis, vasovagal syncope, chronic atrial fibrillation presenting today to establish care.  Her cardiac history dates back to June 2021 when she was diagnosed with atrial fibrillation.  At that time she decided to continue with medical management after seeing Dr. Ladona Ridgel.  She is admitted in May 2022 with atrial fibrillation and hepatic encephalopathy.  Recurrent admission and 7/22 with acute on chronic HFpEF due to dietary indiscretion.  At that time she was started on Farxiga, Bumex 2 mg daily and metolazone as needed.  She has been unable to start spironolactone due to symptomatic hypotension.  Due to her underlying cirrhosis she was planned to undergo TIPS on April 2024 but the procedure was aborted due to a right atrial pressure of 23 mmHg.  Today she presents to discuss med optimization to improve RA pressures and allow for TIPS. She is here today with her husband. At baseline, she performs ADLS independently however does become very quickly fatigued due to shortness of breath, lower extremity edema and deconditioning from her underlying cirrhosis.  She reports compliance with medications but despite this struggles to maintain euvolemia.   Past Medical History:  Diagnosis Date   Anemia    hx of   Aortic stenosis 02/2023   Echo 03/07/23 LVEF 55-60%, mild to moderate AS   Arthritis    knees   CHF (congestive heart failure) (HCC)    chronic diastolic   Diabetes mellitus without complication (HCC)    type 2   Dyspnea    Dysrhythmia    Fibromyalgia    Gastric varices    large   H/O transfusion of platelets    Heart murmur    never has caused any problems   Hx of colonic  polyps    s/p partial colectomy   Hyperlipidemia    Hypertension    Joint pain    in knees and back spasms   Left leg swelling    wear compression hose   Liver cirrhosis secondary to NASH (nonalcoholic steatohepatitis) (HCC) dx nov 2014   had enlarged spleen also    Morbid obesity (HCC)    Persistent atrial fibrillation (HCC)    Pneumonia 10/2016   x 5   PONV (postoperative nausea and vomiting)    in past none recent   Portal hypertension (HCC)    Sleep apnea    uses cpap, setting varies between 14-20   Spleen enlarged    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 [facility])     Current Outpatient Medications  Medication Sig Dispense Refill   albuterol (VENTOLIN HFA) 108 (90 Base) MCG/ACT inhaler Inhale 1-2 puffs into the lungs every 6 (six) hours as needed for wheezing or shortness of breath.     diclofenac Sodium (VOLTAREN) 1 % GEL Apply 1 Application topically at bedtime as needed (pain).     diltiazem (CARDIZEM CD) 180 MG 24 hr capsule Take 1 capsule (180 mg total) by mouth daily. 90 capsule 1   lactulose (CHRONULAC) 10 GM/15ML solution Take 10 g by mouth 3 (three) times daily.     Lancets (ONETOUCH DELICA PLUS LANCET33G) MISC Apply topically 3 (three) times daily.     metolazone (ZAROXOLYN) 5 MG tablet Take 1 tablet (5 mg total) by mouth once  a week. Take one tablet on Sundays 30 minutes before Bumex 12 tablet 3   midodrine (PROAMATINE) 10 MG tablet Take 1 tablet (10 mg total) by mouth 3 (three) times daily. 270 tablet 2   Multiple Vitamins-Minerals (PRESERVISION AREDS 2) CAPS Take 1 capsule by mouth 2 (two) times daily.     ONETOUCH VERIO test strip 1 each 3 (three) times daily.     Propylene Glycol (SYSTANE BALANCE) 0.6 % SOLN Place 1 drop into both eyes 2 (two) times daily.     XIFAXAN 550 MG TABS tablet Take 550 mg by mouth 2 (two) times daily.     digoxin (LANOXIN) 0.125 MG tablet Take 1 tablet (0.125 mg total) by mouth daily. 30 tablet 3   Lidocaine HCl-Benzyl Alcohol (SALONPAS  LIDOCAINE PLUS) 4-10 % CREA Apply 1 Application topically daily as needed (knee pain).     metoprolol tartrate (LOPRESSOR) 25 MG tablet Take 0.5 tablets (12.5 mg total) by mouth 2 (two) times daily. 90 tablet 3   potassium chloride (MICRO-K) 10 MEQ CR capsule Take 8 capsules (80 mEq total) by mouth 2 (two) times daily. 480 capsule 5   Semaglutide, 2 MG/DOSE, (OZEMPIC, 2 MG/DOSE,) 8 MG/3ML SOPN Inject 2 mg into the skin once a week. 3 mL 6   torsemide (DEMADEX) 20 MG tablet Take 4 tablets (80 mg total) by mouth every morning AND 2 tablets (40 mg total) every evening. 540 tablet 3   No current facility-administered medications for this encounter.    Allergies  Allergen Reactions   Celebrex [Celecoxib] Other (See Comments)    "speech slurred" with celebrex   Shellfish Allergy Swelling and Rash    TINGLING AND SWELLING OF LIPS. LARGE WHELPS QUARTER-SIZE   Barium-Containing Compounds Other (See Comments)    TACHYCARDIA   Prednisone Other (See Comments)    TACHYCARDIA   Statins Other (See Comments)    AGGRAVATED FIBROMYALGIA   Fentanyl     Hallucinations    Ibuprofen Other (See Comments)    UNSPECIFIED SPECIFIC REACTION >> "DUE TO PLATELETS"   Sglt2 Inhibitors     Cellulitis of pannus   Tramadol     Hallucination    Tylenol [Acetaminophen] Other (See Comments)    UNSPECIFIED SPECIFIC REACTION >> "DUE TO PLATELETS"   Zetia [Ezetimibe]     Headache      Social History   Socioeconomic History   Marital status: Married    Spouse name: Not on file   Number of children: Not on file   Years of education: Not on file   Highest education level: Not on file  Occupational History   Occupation: Scientist, product/process development  Tobacco Use   Smoking status: Former    Current packs/day: 0.00    Average packs/day: 1 pack/day for 3.0 years (3.0 ttl pk-yrs)    Types: Cigarettes    Start date: 09/20/1971    Quit date: 09/19/1974    Years since quitting: 48.7   Smokeless tobacco: Never  Vaping Use    Vaping status: Never Used  Substance and Sexual Activity   Alcohol use: Not Currently    Comment: occ   Drug use: No   Sexual activity: Not on file  Other Topics Concern   Not on file  Social History Narrative   Lives in [place] with her husband.  Instructor for early childhood development.     Social Determinants of Health   Financial Resource Strain: Not on file  Food Insecurity: No Food Insecurity (04/08/2023)  Hunger Vital Sign    Worried About Running Out of Food in the Last Year: Never true    Ran Out of Food in the Last Year: Never true  Transportation Needs: No Transportation Needs (04/08/2023)   PRAPARE - Administrator, Civil Service (Medical): No    Lack of Transportation (Non-Medical): No  Physical Activity: Not on file  Stress: Not on file  Social Connections: Unknown (01/31/2022)   Received from [facility]   Social Network    Social Network: Not on file  Intimate Partner Violence: Not At Risk (04/08/2023)   Humiliation, Afraid, Rape, and Kick questionnaire    Fear of Current or Ex-Partner: No    Emotionally Abused: No    Physically Abused: No    Sexually Abused: No      Family History  Problem Relation Age of Onset   Heart failure Mother    Cancer Mother    Hyperlipidemia Mother    Congestive Heart Failure Mother    Stroke Mother    Lung cancer Father    Cancer Father    Cancer Brother    Hyperlipidemia Brother    Stroke Other    Breast cancer Neg Hx     PHYSICAL EXAM: Vitals:   04/28/23 1142  BP: 104/60  Pulse: 79  SpO2: 96%   GENERAL: Well nourished, well developed, and in no apparent distress at rest.  HEENT: Negative for arcus senilis or xanthelasma. There is no scleral icterus.  The mucous membranes are pink and moist.   NECK: Supple, No masses. Normal carotid upstrokes without bruits. No masses or thyromegaly.    CHEST: There are no chest wall deformities. There is no chest wall tenderness. Respirations are unlabored.   Lungs-decreased at bases CARDIAC:  JVP: To assess due to thick neck however I suspect very elevated         Normal S1, S2  Normal rate with regular rhythm. No murmurs, rubs or gallops.  Pulses are 2+ and symmetrical in upper and lower extremities.  2-3+ edema.  ABDOMEN: Soft, non-tender, non-distended. There are no masses or hepatomegaly. There are normal bowel sounds.  EXTREMITIES: Warm and well perfused with no cyanosis, clubbing.  LYMPHATIC: No axillary or supraclavicular lymphadenopathy.  NEUROLOGIC: Patient is oriented x3 with no focal or lateralizing neurologic deficits.  PSYCH: Patients affect is appropriate, there is no evidence of anxiety or depression.  SKIN: Warm and dry; no lesions or wounds.   DATA REVIEW  ECG: 04/06/23: atrial fibrillation  as per my personal interpretation  ECHO: 03/07/23: LVEF 65%, normal RV function; RV is dilated with septal flattening as per my personal interpretation  CATH: Will order right heart cath   ASSESSMENT & PLAN:  Heart failure with preserved ejection fraction -Given severely elevated RA pressure of 23 during TIPS I suspect she may have underlying pulmonary hypertension from chronic OHS/OSA and group 2 from underlying HFpEF. -Will hold off on spironolactone for the time being due to renal function.  Similarly I do have some concern for urinary tract infections for her. -For the time being, decrease digoxin to 0.125 mcg daily from 0.25.  Increase torsemide to 80 mg daily for the next 5 days and then 60 mg once daily.  Follow-up in app clinic in 1 week to continue aggressive diuresis and repeat labs.  Will plan on right heart catheterization before any further medication changes.   -If she has underlying pulmonary arterial hypertension, will start PAH therapy before proceeding with  TIPS.  Otherwise she will just require aggressive diuresis to lower her RA pressure.  2. NASH Cirrhosis - Child pugh B, MELD 15 - Diagnosed in 2009 - Hx of  esophageal varices - x1 admission for hepatic encephalopathy with lactulose/rifaximin.   3. Atrial fibrillation -EKG with atrial fibrillation. -Anticoagulation being held due to persistent thrombocytopenia from underlying cirrhosis -Will continue rate control strategy for the time being; has been doing very well on diltiazem.  Will continue for the time being.  4. Thrombocytopenia - Secondary to cirrhosis; plt count 20-30K for the past month.   Khristie Sak Advanced Heart Failure Mechanical Circulatory Support

## 2023-04-27 NOTE — Therapy (Signed)
OUTPATIENT PHYSICAL THERAPY NEURO EVALUATION   Patient Name: Hannah Khan MRN: [id number] DOB:1956/04/14, 67 y.o., female Today's Date: 04/27/2023   PCP: Darrow Bussing, MD REFERRING PROVIDER: Alver Sorrow, NP   END OF SESSION:  PT End of Session - 04/27/23 1448     Visit Number 1    Number of Visits 17    Date for PT Re-Evaluation 06/23/23    Authorization Type [employer]-auth submitted upon completion of eval    PT Start Time 1448    PT Stop Time 1532    PT Time Calculation (min) 44 min    Activity Tolerance Patient tolerated treatment well    Behavior During Therapy WFL for tasks assessed/performed             Past Medical History:  Diagnosis Date   Anemia    hx of   Aortic stenosis 02/2023   Echo 03/07/23 LVEF 55-60%, mild to moderate AS   Arthritis    knees   CHF (congestive heart failure) (HCC)    chronic diastolic   Diabetes mellitus without complication (HCC)    type 2   Dyspnea    Dysrhythmia    Fibromyalgia    Gastric varices    large   H/O transfusion of platelets    Heart murmur    never has caused any problems   Hx of colonic polyps    s/p partial colectomy   Hyperlipidemia    Hypertension    Joint pain    in knees and back spasms   Left leg swelling    wear compression hose   Liver cirrhosis secondary to NASH (nonalcoholic steatohepatitis) (HCC) dx nov 2014   had enlarged spleen also    Morbid obesity (HCC)    Persistent atrial fibrillation (HCC)    Pneumonia 10/2016   x 5   PONV (postoperative nausea and vomiting)    in past none recent   Portal hypertension (HCC)    Sleep apnea    uses cpap, setting varies between 14-20   Spleen enlarged    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 [facility])    Past Surgical History:  Procedure Laterality Date   BREAST LUMPECTOMY WITH RADIOACTIVE SEED LOCALIZATION Right 08/29/2017   Procedure: RIGHT BREAST LUMPECTOMY WITH RADIOACTIVE SEEDS LOCALIZATION;  Surgeon: Harriette Bouillon, MD;  Location: [facility]  OR;  Service: General;  Laterality: Right;   CESAREAN SECTION  1989   CHOLECYSTECTOMY  20 yrs ago   COLECTOMY  2011   COLONOSCOPY     COLONOSCOPY N/A 09/05/2018   Procedure: COLONOSCOPY;  Surgeon: Willis Modena, MD;  Location: [facility];  Service: Endoscopy;  Laterality: N/A;   COLONOSCOPY WITH PROPOFOL Bilateral 05/04/2022   Procedure: COLONOSCOPY WITH PROPOFOL;  Surgeon: Willis Modena, MD;  Location: [facility];  Service: Gastroenterology;  Laterality: Bilateral;   DILATATION & CURETTAGE/HYSTEROSCOPY WITH MYOSURE N/A 05/06/2016   Procedure: DILATATION & CURETTAGE/HYSTEROSCOPY WITH MYOSURE WITH POLYPECTOMY;  Surgeon: Myna Hidalgo, DO;  Location: [facility] ORS;  Service: Gynecology;  Laterality: N/A;   ESOPHAGOGASTRODUODENOSCOPY (EGD) WITH PROPOFOL N/A 09/04/2013   Procedure: ESOPHAGOGASTRODUODENOSCOPY (EGD) WITH PROPOFOL;  Surgeon: Willis Modena, MD;  Location: [facility];  Service: Endoscopy;  Laterality: N/A;   ESOPHAGOGASTRODUODENOSCOPY (EGD) WITH PROPOFOL Bilateral 05/04/2022   Procedure: ESOPHAGOGASTRODUODENOSCOPY (EGD) WITH PROPOFOL;  Surgeon: Willis Modena, MD;  Location: [facility];  Service: Gastroenterology;  Laterality: Bilateral;   IR INTRAVASCULAR ULTRASOUND NON CORONARY  12/23/2022   IR RADIOLOGIST EVAL & MGMT  09/21/2022   IR RADIOLOGIST EVAL &  MGMT  10/06/2022   IR RADIOLOGIST EVAL & MGMT  11/02/2022   IR RADIOLOGIST EVAL & MGMT  01/31/2023   IR TIPS  12/23/2022   IR US GUIDE VASC ACCESS RIGHT  12/23/2022   KNEE ARTHROSCOPY  yrs ago   bilateral, one done x 1, one done twice   POLYPECTOMY  09/05/2018   Procedure: POLYPECTOMY;  Surgeon: Willis Modena, MD;  Location: [facility];  Service: Endoscopy;;   RADIOLOGY WITH ANESTHESIA N/A 12/23/2022   Procedure: TIPS;  Surgeon: Bennie Dallas, MD;  Location: [facility] OR;  Service: Radiology;  Laterality: N/A;   Patient Active Problem List   Diagnosis Date Noted   Intractable nausea and vomiting 04/07/2023   Intractable vomiting with  nausea 04/06/2023   Acute diastolic congestive heart failure (HCC) 03/06/2023   Idiopathic hypotension 03/06/2023   Sepsis due to cellulitis (HCC) 03/05/2023   Severe sepsis (HCC) 03/05/2023   Portal hypertension (HCC) 12/23/2022   Acute encephalopathy 01/10/2021   Secondary hypercoagulable state (HCC) 06/23/2020   Streptococcal bacteremia 03/16/2020   Chronic venous stasis dermatitis of both lower extremities 03/16/2020   Atrial fibrillation with RVR (HCC) 04/26/2018   Acute lower UTI 04/26/2018   Liver cirrhosis secondary to NASH (nonalcoholic steatohepatitis) (HCC) 04/26/2018   Hyperlipidemia 04/26/2018   Type 2 diabetes mellitus with other specified complication (HCC)    Cellulitis of right lower extremity 03/15/2018   Sepsis (HCC) 03/14/2018   Community acquired pneumonia of right lower lobe of lung 10/31/2016   Muscular abdominal pain in right upper quadrant 07/11/2016   Chronic diastolic heart failure (HCC) 07/10/2016   Thrombocytopenia (HCC) 07/10/2016   Permanent atrial fibrillation (HCC) 01/30/2016   Type 2 diabetes mellitus without complication, without long-term current use of insulin (HCC) 01/30/2016   Morbid obesity- 12/10/2015   Cellulitis, abdominal wall    Melena 01/31/2015   Aortic heart murmur 01/31/2015   Pancytopenia (HCC) 07/23/2013   Obstructive sleep apnea 08/07/2007   Essential hypertension 08/07/2007   ALLERGIC RHINITIS 08/07/2007    ONSET DATE: 04/18/2023 (MD referral)  REFERRING DIAG:  I50.32 (ICD-10-CM) - Chronic diastolic heart failure (HCC)  H85.27 (ICD-10-CM) - Permanent atrial fibrillation (HCC)  K75.81,K74.60 (ICD-10-CM) - Liver cirrhosis secondary to NASH (nonalcoholic steatohepatitis) (HCC)  I35.0 (ICD-10-CM) - Aortic valve stenosis, etiology of cardiac valve disease unspecified  I95.9 (ICD-10-CM) - Hypotension, unspecified hypotension type  R53.81 (ICD-10-CM) - Physical deconditioning  E87.6 (ICD-10-CM) - Hypokalemia  I10 (ICD-10-CM) -  Essential hypertension    THERAPY DIAG:  Muscle weakness (generalized)  Unsteadiness on feet  Other abnormalities of gait and mobility  Rationale for Evaluation and Treatment: Rehabilitation  SUBJECTIVE:  SUBJECTIVE STATEMENT: Have had two recent bouts in the hospital-one due to my heart and low BP, one due to Nausea/vomiting and a fall.  Have had less mobility overall in the past 5-6 years.  Really would like to be able to do more around my house.  Just get very tired easily due to a-fib and due to the pain in knees/low back Pt accompanied by: significant other and (husband in lobby)  PERTINENT HISTORY: hx of chronic diastolic heart failure, morbid obesity, OSA, NASH/cirrhosis, chronic thrombocytopenia, DM2, aortic stenosis, vasovagal presyncope, chronic atrial fibrillation not on anticoagulation.   PAIN:  Are you having pain? Yes: NPRS scale: 8/10 Pain location: shoulders, upper arms, hips, back, knee Pain description: achy, pain all the time Aggravating factors: unsure, hurts all the time Relieving factors: unsure  PRECAUTIONS: Fall and Other: morbid obesity, hx of low BP, cardiac issues ; 3# lifting restriction; a-fib  RED FLAGS: None   WEIGHT BEARING RESTRICTIONS: No  FALLS: Has patient fallen in last 6 months? Yes. Number of falls 2  LIVING ENVIRONMENT: Lives with: lives with their spouse Lives in: House/apartment Stairs:  1 step into the home from garage Has following equipment at home: Dan Humphreys - 4 wheeled  PLOF: Independent with household mobility with device and Independent with community mobility with device  PATIENT GOALS: Want to be able to get around better and do things in my house.  OBJECTIVE:   DIAGNOSTIC FINDINGS: NA  COGNITION: Overall cognitive status: Within  functional limits for tasks assessed   SENSATION: Light touch: WFL  EDEMA:  BLE edema + 1 pitting (has compression stockings, but does not use often)  POSTURE: rounded shoulders and forward head  LOWER EXTREMITY ROM:   A/ROM WFL in sitting (some limitations due to body habitus)   LOWER EXTREMITY MMT:  Grossly tested 4/5 except knee extension L 3+/5  MMT Right Eval Left Eval  Hip flexion    Hip extension    Hip abduction    Hip adduction    Hip internal rotation    Hip external rotation    Knee flexion    Knee extension    Ankle dorsiflexion    Ankle plantarflexion    Ankle inversion    Ankle eversion    (Blank rows = not tested)  VITALS: BP 104/56 HR 69 O2 sats:  93>97%  TRANSFERS: Assistive device utilized: None  Sit to stand: SBA and uses BUE support Stand to sit: SBA and BUE support  GAIT: Gait pattern: step through pattern, antalgic, and wide BOS Distance walked: 50 ft Assistive device utilized: Environmental consultant - 4 wheeled Level of assistance: SBA Comments: Fatigues and has to stop gait to sit after 85 ft  FUNCTIONAL TESTS:  2 minute walk test: 85 ft; sits at 1 minute, in addition to 1 standing rest break 10 meter walk test: 21.03 sec = 1.56 ft/sec 5x sit to stand:  with UE support, 24.82 sec  TODAY'S TREATMENT:  DATE: 04/27/2023    PATIENT EDUCATION: Education details: PT eval results, POC Person educated: Patient Education method: Explanation Education comprehension: verbalized understanding  HOME EXERCISE PROGRAM: Not yet initiated  GOALS: Goals reviewed with patient? Yes  SHORT TERM GOALS: Target date: 05/26/2023  Pt will be independent with HEP for improved strength, balance, gait, decreased pain. Baseline: Goal status: INITIAL  2.  Pt will improve 5x sit<>stand to less than or equal to 20 sec to demonstrate improved  functional strength and transfer efficiency. Baseline: 24.82 sec Goal status: INITIAL  3. Pt will ambulate at least 115 in 2 MWT for improved gait efficiency in home. Baseline: 85 ft. With one sitting break after 1 minute Goal status: INITIAL  LONG TERM GOALS: Target date: 06/23/2023  Pt will be independent with progression of HEP for improved strength, balance, gait. Baseline:  Goal status: INITIAL  2.  Pt will improve 5x sit<>stand to less than or equal to 15 sec to demonstrate improved functional strength and transfer efficiency. Baseline: 24.82 sec Goal status: INITIAL  3.  Pt will improve gait velocity to at least 2 ft/sec for improved gait efficiency and safety. Baseline: 1.56 ft/sec Goal status: INITIAL  4.  Pt will ambulate at least 150 ft in 2 MWT for improved gait efficiency in home environment. Baseline:  Goal status: INITIAL  5.  Pt will verbalize ongoing plans for continued community fitness upon d/c from PT to maximize gains made in PT.  Baseline:  Goal status:  INITIAL   ASSESSMENT:  CLINICAL IMPRESSION: Patient is a 67 y.o. female who was seen today for physical therapy evaluation and treatment for debility, chronic heart failure, a-fib.  She presents with decreased strength, decreased balance, decreased independence with gait, decreased endurance, pain, abnormal posture.   She is at fall risk per FTSTS, gait velocity measures.  She demo decreased activity tolerance with gait distance of 85 ft in 2 MWT (pt needs to stop and sit at one minute and does not resume).  Due to hx of joint pain, she may benefit from aquatic therapy to assist with ability to participate in exercise to promote strength, balance and gait.  She will benefit from skilled PT to address the above stated deficits to decrease fall risk and to improve overall functional mobility and independence.  OBJECTIVE IMPAIRMENTS: Abnormal gait, cardiopulmonary status limiting activity, decreased activity  tolerance, decreased balance, decreased endurance, decreased mobility, difficulty walking, decreased strength, postural dysfunction, and pain.   ACTIVITY LIMITATIONS: carrying, lifting, bending, standing, transfers, reach over head, hygiene/grooming, and locomotion level  PARTICIPATION LIMITATIONS: meal prep, cleaning, laundry, and community activity  PERSONAL FACTORS: 3+ comorbidities: significant medical history (see above)  are also affecting patient's functional outcome.   REHAB POTENTIAL: Good  CLINICAL DECISION MAKING: Evolving/moderate complexity  EVALUATION COMPLEXITY: Moderate  PLAN:  PT FREQUENCY: 2x/week  PT DURATION: 8 weeks plus eval (*Would like to work towards 1x/wk in pool, 1x/wk in clinic)  PLANNED INTERVENTIONS: Therapeutic exercises, Therapeutic activity, Neuromuscular re-education, Balance training, Gait training, Patient/Family education, Self Care, Aquatic Therapy, and Manual therapy  PLAN FOR NEXT SESSION: Initiate HEP for strengthening, gait, standing activities.  *Aquatic therapy to assist with pt's tolerance for strengthening, balance, gait activities in aquatic setting, due to pt's extensive history of joint pain, to aid with pt's ability to progress towards PT goals.   Gean Maidens., PT 04/27/2023, 3:59 PM  [facility] [address] [place], [address] [place], [place] [address] Phone # [phone])  [phone] Fax # [phone]   Referring diagnosis?  I50.32 (ICD-10-CM) - Chronic diastolic heart failure (HCC)  Y78.29 (ICD-10-CM) - Permanent atrial fibrillation (HCC)  K75.81,K74.60 (ICD-10-CM) - Liver cirrhosis secondary to NASH (nonalcoholic steatohepatitis) (HCC)  I35.0 (ICD-10-CM) - Aortic valve stenosis, etiology of cardiac valve disease unspecified  I95.9 (ICD-10-CM) - Hypotension, unspecified hypotension type  R53.81 (ICD-10-CM) - Physical deconditioning  E87.6 (ICD-10-CM) - Hypokalemia  I10 (ICD-10-CM) - Essential  hypertension   Treatment diagnosis? (if different than referring diagnosis) Muscle weakness (generalized)  Unsteadiness on feet  Other abnormalities of gait and mobility What was this (referring dx) caused by? []  Surgery []  Fall [x]  Ongoing issue []  Arthritis [x]  Other: ___Cardiac medical hx_________  Laterality: [x]  Rt [x]  Lt []  Both  Check all possible CPT codes:  *CHOOSE 10 OR LESS*    [x]  97110 (Therapeutic Exercise)  []  56213 (SLP Treatment)  [x]  97112 (Neuro Re-ed)   []  92526 (Swallowing Treatment)   [x]  97116 (Gait Training)   []  [id number] (Cognitive Training, 1st 15 minutes) [x]  97140 (Manual Therapy)   []  97130 (Cognitive Training, each add'l 15 minutes)  []  97164 (Re-evaluation)                              []  Other, List CPT Code ____________  [x]  97530 (Therapeutic Activities)     [x]  97535 (Self Care)   []  All codes above (97110 - 97535)  []  97012 (Mechanical Traction)  []  97014 (E-stim Unattended)  []  97032 (E-stim manual)  []  97033 (Ionto)  []  97035 (Ultrasound) []  97750 (Physical Performance Training) [x]  [id number] (Aquatic Therapy) []  97016 (Vasopneumatic Device) []  [id number] (Paraffin) []  97034 (Contrast Bath) []  97597 (Wound Care 1st 20 sq cm) []  97598 (Wound Care each add'l 20 sq cm) []  97760 (Orthotic Fabrication, Fitting, Training Initial) []  [id number] (Prosthetic Management and Training Initial) []  [id number] (Orthotic or Prosthetic Training/ Modification Subsequent)

## 2023-04-28 ENCOUNTER — Ambulatory Visit (HOSPITAL_COMMUNITY)
Admission: RE | Admit: 2023-04-28 | Discharge: 2023-04-28 | Disposition: A | Discharge status: Home / Self Care | Payer: Medicare PPO | Source: Ambulatory Visit | Attending: Cardiology | Admitting: Cardiology

## 2023-04-28 ENCOUNTER — Encounter (HOSPITAL_COMMUNITY): Payer: Self-pay | Admitting: Cardiology

## 2023-04-28 VITALS — BP 104/60 | HR 79 | Wt 347.6 lb

## 2023-04-28 DIAGNOSIS — I350 Nonrheumatic aortic (valve) stenosis: Secondary | ICD-10-CM

## 2023-04-28 DIAGNOSIS — G4733 Obstructive sleep apnea (adult) (pediatric): Secondary | ICD-10-CM | POA: Diagnosis not present

## 2023-04-28 DIAGNOSIS — L03311 Cellulitis of abdominal wall: Secondary | ICD-10-CM | POA: Diagnosis not present

## 2023-04-28 DIAGNOSIS — I110 Hypertensive heart disease with heart failure: Secondary | ICD-10-CM | POA: Diagnosis not present

## 2023-04-28 DIAGNOSIS — I5032 Chronic diastolic (congestive) heart failure: Secondary | ICD-10-CM | POA: Diagnosis not present

## 2023-04-28 DIAGNOSIS — I959 Hypotension, unspecified: Secondary | ICD-10-CM | POA: Diagnosis not present

## 2023-04-28 DIAGNOSIS — I10 Essential (primary) hypertension: Secondary | ICD-10-CM | POA: Diagnosis not present

## 2023-04-28 DIAGNOSIS — I5033 Acute on chronic diastolic (congestive) heart failure: Secondary | ICD-10-CM | POA: Diagnosis not present

## 2023-04-28 DIAGNOSIS — I4821 Permanent atrial fibrillation: Secondary | ICD-10-CM | POA: Diagnosis not present

## 2023-04-28 DIAGNOSIS — K7581 Nonalcoholic steatohepatitis (NASH): Secondary | ICD-10-CM

## 2023-04-28 DIAGNOSIS — E876 Hypokalemia: Secondary | ICD-10-CM | POA: Diagnosis not present

## 2023-04-28 DIAGNOSIS — K7460 Unspecified cirrhosis of liver: Secondary | ICD-10-CM | POA: Diagnosis not present

## 2023-04-28 DIAGNOSIS — R5381 Other malaise: Secondary | ICD-10-CM | POA: Diagnosis not present

## 2023-04-28 DIAGNOSIS — Z01810 Encounter for preprocedural cardiovascular examination: Secondary | ICD-10-CM | POA: Insufficient documentation

## 2023-04-28 DIAGNOSIS — M170 Bilateral primary osteoarthritis of knee: Secondary | ICD-10-CM | POA: Diagnosis not present

## 2023-04-28 DIAGNOSIS — I444 Left anterior fascicular block: Secondary | ICD-10-CM | POA: Diagnosis not present

## 2023-04-28 DIAGNOSIS — E119 Type 2 diabetes mellitus without complications: Secondary | ICD-10-CM | POA: Diagnosis not present

## 2023-04-28 DIAGNOSIS — A419 Sepsis, unspecified organism: Secondary | ICD-10-CM | POA: Diagnosis not present

## 2023-04-28 DIAGNOSIS — I4891 Unspecified atrial fibrillation: Secondary | ICD-10-CM | POA: Diagnosis not present

## 2023-04-28 MED ORDER — DIGOXIN 125 MCG PO TABS: 0.1250 mg | ORAL_TABLET | Freq: Every day | ORAL | 3 refills | Status: DC

## 2023-04-28 MED ORDER — TORSEMIDE 20 MG PO TABS
60.0000 mg | ORAL_TABLET | Freq: Every day | ORAL | 3 refills | Status: DC
Start: 2023-04-28 — End: 2023-05-08

## 2023-04-28 MED ORDER — POTASSIUM CHLORIDE ER 10 MEQ PO CPCR
ORAL_CAPSULE | ORAL | 1 refills | Status: DC
Start: 2023-04-28 — End: 2023-05-08

## 2023-04-28 NOTE — Patient Instructions (Signed)
Medication Changes:  DECREASE DIGOXIN TO 0.125MG  TABLET DAILY- NEW PRESCRIPTION SENT TO PHARMACY   TAKE POTASSIUM (4) TABLETS THREE TIMES DAILY   TAKE 80MG  TORSEMIDE DAILY FOR 3-5 DAYS AND THEN RETURN TO 60MG  ONCE DAILY   Lab Work:  RETURN FOR LABS IN 1 WEEKS AS SCHEDULED  Follow-Up in: 10 DAYS AS SCHEDULED WITH APP   At the [facility], you and your health needs are our priority. We have a designated team specialized in the treatment of Heart Failure. This Care Team includes your primary Heart Failure Specialized Cardiologist (physician), Advanced Practice Providers (APPs- Physician Assistants and Nurse Practitioners), and Pharmacist who all work together to provide you with the care you need, when you need it.   You may see any of the following providers on your designated Care Team at your next follow up:  Dr. Arvilla Meres Dr. Marca Ancona Dr. Marcos Eke, NP Robbie Lis, [place] [facility] [place], [place] Brynda Peon, NP Karle Plumber, PharmD   Please be sure to bring in all your medications bottles to every appointment.   Need to Contact us:  If you have any questions or concerns before your next appointment please send [place] a message through [place] or call our office at [phone].    TO LEAVE A MESSAGE FOR THE NURSE SELECT OPTION 2, PLEASE LEAVE A MESSAGE INCLUDING: YOUR NAME DATE OF BIRTH CALL BACK NUMBER REASON FOR CALL**this is important as we prioritize the call backs  YOU WILL RECEIVE A CALL BACK THE SAME DAY AS LONG AS YOU CALL BEFORE 4:00 PM

## 2023-05-01 DIAGNOSIS — Z86010 Personal history of colonic polyps: Secondary | ICD-10-CM | POA: Diagnosis not present

## 2023-05-01 DIAGNOSIS — K7460 Unspecified cirrhosis of liver: Secondary | ICD-10-CM | POA: Diagnosis not present

## 2023-05-01 DIAGNOSIS — I864 Gastric varices: Secondary | ICD-10-CM | POA: Diagnosis not present

## 2023-05-01 DIAGNOSIS — R112 Nausea with vomiting, unspecified: Secondary | ICD-10-CM | POA: Diagnosis not present

## 2023-05-03 ENCOUNTER — Ambulatory Visit: Payer: Medicare PPO | Admitting: Physical Therapy

## 2023-05-03 ENCOUNTER — Encounter: Payer: Self-pay | Admitting: Physical Therapy

## 2023-05-03 DIAGNOSIS — I350 Nonrheumatic aortic (valve) stenosis: Secondary | ICD-10-CM | POA: Diagnosis not present

## 2023-05-03 DIAGNOSIS — K7581 Nonalcoholic steatohepatitis (NASH): Secondary | ICD-10-CM | POA: Diagnosis not present

## 2023-05-03 DIAGNOSIS — I959 Hypotension, unspecified: Secondary | ICD-10-CM | POA: Diagnosis not present

## 2023-05-03 DIAGNOSIS — I4821 Permanent atrial fibrillation: Secondary | ICD-10-CM | POA: Diagnosis not present

## 2023-05-03 DIAGNOSIS — K7460 Unspecified cirrhosis of liver: Secondary | ICD-10-CM | POA: Diagnosis not present

## 2023-05-03 DIAGNOSIS — R2689 Other abnormalities of gait and mobility: Secondary | ICD-10-CM

## 2023-05-03 DIAGNOSIS — R2681 Unsteadiness on feet: Secondary | ICD-10-CM

## 2023-05-03 DIAGNOSIS — M6281 Muscle weakness (generalized): Secondary | ICD-10-CM

## 2023-05-03 DIAGNOSIS — I5032 Chronic diastolic (congestive) heart failure: Secondary | ICD-10-CM | POA: Diagnosis not present

## 2023-05-03 NOTE — Therapy (Signed)
OUTPATIENT PHYSICAL THERAPY NEURO TREATMENT   Patient Name: Hannah Khan MRN: [id number] DOB:1956/03/24, 67 y.o., female Today's Date: 05/03/2023   PCP: Darrow Bussing, MD REFERRING PROVIDER: Alver Sorrow, NP   END OF SESSION:  PT End of Session - 05/03/23 0800     Visit Number 2    Number of Visits 17    Date for PT Re-Evaluation 06/23/23    Authorization Type [employer]    Authorization Time Period 04/27/2023-06/23/2023    Authorization - Visit Number 2    Authorization - Number of Visits 17    PT Start Time 0803    PT Stop Time 0842    PT Time Calculation (min) 39 min    Activity Tolerance Patient tolerated treatment well    Behavior During Therapy [facility] for tasks assessed/performed              Past Medical History:  Diagnosis Date   Anemia    hx of   Aortic stenosis 02/2023   Echo 03/07/23 LVEF 55-60%, mild to moderate AS   Arthritis    knees   CHF (congestive heart failure) (HCC)    chronic diastolic   Diabetes mellitus without complication (HCC)    type 2   Dyspnea    Dysrhythmia    Fibromyalgia    Gastric varices    large   H/O transfusion of platelets    Heart murmur    never has caused any problems   Hx of colonic polyps    s/p partial colectomy   Hyperlipidemia    Hypertension    Joint pain    in knees and back spasms   Left leg swelling    wear compression hose   Liver cirrhosis secondary to NASH (nonalcoholic steatohepatitis) (HCC) dx nov 2014   had enlarged spleen also    Morbid obesity (HCC)    Persistent atrial fibrillation (HCC)    Pneumonia 10/2016   x 5   PONV (postoperative nausea and vomiting)    in past none recent   Portal hypertension (HCC)    Sleep apnea    uses cpap, setting varies between 14-20   Spleen enlarged    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 [facility])    Past Surgical History:  Procedure Laterality Date   BREAST LUMPECTOMY WITH RADIOACTIVE SEED LOCALIZATION Right 08/29/2017   Procedure: RIGHT BREAST  LUMPECTOMY WITH RADIOACTIVE SEEDS LOCALIZATION;  Surgeon: Harriette Bouillon, MD;  Location: [facility] OR;  Service: General;  Laterality: Right;   CESAREAN SECTION  1989   CHOLECYSTECTOMY  20 yrs ago   COLECTOMY  2011   COLONOSCOPY     COLONOSCOPY N/A 09/05/2018   Procedure: COLONOSCOPY;  Surgeon: Willis Modena, MD;  Location: [facility];  Service: Endoscopy;  Laterality: N/A;   COLONOSCOPY WITH PROPOFOL Bilateral 05/04/2022   Procedure: COLONOSCOPY WITH PROPOFOL;  Surgeon: Willis Modena, MD;  Location: [facility];  Service: Gastroenterology;  Laterality: Bilateral;   DILATATION & CURETTAGE/HYSTEROSCOPY WITH MYOSURE N/A 05/06/2016   Procedure: DILATATION & CURETTAGE/HYSTEROSCOPY WITH MYOSURE WITH POLYPECTOMY;  Surgeon: Myna Hidalgo, DO;  Location: [facility] OR[facility];  Service: Gynecology;  Laterality: N/A;   ESOPHAGOGASTRODUODENOSCOPY (EGD) WITH PROPOFOL N/A 09/04/2013   Procedure: ESOPHAGOGASTRODUODENOSCOPY (EGD) WITH PROPOFOL;  Surgeon: Willis Modena, MD;  Location: [facility];  Service: Endoscopy;  Laterality: N/A;   ESOPHAGOGASTRODUODENOSCOPY (EGD) WITH PROPOFOL Bilateral 05/04/2022   Procedure: ESOPHAGOGASTRODUODENOSCOPY (EGD) WITH PROPOFOL;  Surgeon: Willis Modena, MD;  Location: [facility];  Service: Gastroenterology;  Laterality: Bilateral;   IR INTRAVASCULAR  ULTRASOUND NON CORONARY  12/23/2022   IR RADIOLOGIST EVAL & MGMT  09/21/2022   IR RADIOLOGIST EVAL & MGMT  10/06/2022   IR RADIOLOGIST EVAL & MGMT  11/02/2022   IR RADIOLOGIST EVAL & MGMT  01/31/2023   IR TIPS  12/23/2022   IR US GUIDE VASC ACCESS RIGHT  12/23/2022   KNEE ARTHROSCOPY  yrs ago   bilateral, one done x 1, one done twice   POLYPECTOMY  09/05/2018   Procedure: POLYPECTOMY;  Surgeon: Willis Modena, MD;  Location: [facility];  Service: Endoscopy;;   RADIOLOGY WITH ANESTHESIA N/A 12/23/2022   Procedure: TIPS;  Surgeon: Bennie Dallas, MD;  Location: [facility] OR;  Service: Radiology;  Laterality: N/A;   Patient Active Problem List    Diagnosis Date Noted   Intractable nausea and vomiting 04/07/2023   Intractable vomiting with nausea 04/06/2023   Acute diastolic congestive heart failure (HCC) 03/06/2023   Idiopathic hypotension 03/06/2023   Sepsis due to cellulitis (HCC) 03/05/2023   Severe sepsis (HCC) 03/05/2023   Portal hypertension (HCC) 12/23/2022   Acute encephalopathy 01/10/2021   Secondary hypercoagulable state (HCC) 06/23/2020   Streptococcal bacteremia 03/16/2020   Chronic venous stasis dermatitis of both lower extremities 03/16/2020   Atrial fibrillation with RVR (HCC) 04/26/2018   Acute lower UTI 04/26/2018   Liver cirrhosis secondary to NASH (nonalcoholic steatohepatitis) (HCC) 04/26/2018   Hyperlipidemia 04/26/2018   Type 2 diabetes mellitus with other specified complication (HCC)    Cellulitis of right lower extremity 03/15/2018   Sepsis (HCC) 03/14/2018   Community acquired pneumonia of right lower lobe of lung 10/31/2016   Muscular abdominal pain in right upper quadrant 07/11/2016   Chronic diastolic heart failure (HCC) 07/10/2016   Thrombocytopenia (HCC) 07/10/2016   Permanent atrial fibrillation (HCC) 01/30/2016   Type 2 diabetes mellitus without complication, without long-term current use of insulin (HCC) 01/30/2016   Morbid obesity- 12/10/2015   Cellulitis, abdominal wall    Melena 01/31/2015   Aortic heart murmur 01/31/2015   Pancytopenia (HCC) 07/23/2013   Obstructive sleep apnea 08/07/2007   Essential hypertension 08/07/2007   ALLERGIC RHINITIS 08/07/2007    ONSET DATE: 04/18/2023 (MD referral)  REFERRING DIAG:  I50.32 (ICD-10-CM) - Chronic diastolic heart failure (HCC)  Z61.09 (ICD-10-CM) - Permanent atrial fibrillation (HCC)  K75.81,K74.60 (ICD-10-CM) - Liver cirrhosis secondary to NASH (nonalcoholic steatohepatitis) (HCC)  I35.0 (ICD-10-CM) - Aortic valve stenosis, etiology of cardiac valve disease unspecified  I95.9 (ICD-10-CM) - Hypotension, unspecified hypotension type   R53.81 (ICD-10-CM) - Physical deconditioning  E87.6 (ICD-10-CM) - Hypokalemia  I10 (ICD-10-CM) - Essential hypertension    THERAPY DIAG:  Muscle weakness (generalized)  Unsteadiness on feet  Other abnormalities of gait and mobility  Rationale for Evaluation and Treatment: Rehabilitation  SUBJECTIVE:  SUBJECTIVE STATEMENT: It's a so-so day, due to the pain.    Pt accompanied by: significant other and (husband in lobby)  PERTINENT HISTORY: hx of chronic diastolic heart failure, morbid obesity, OSA, NASH/cirrhosis, chronic thrombocytopenia, DM2, aortic stenosis, vasovagal presyncope, chronic atrial fibrillation not on anticoagulation.   PAIN:  Are you having pain? Yes: NPRS scale: 5/10 Pain location: shoulders, upper arms, hips, back, knee Pain description: achy, pain all the time Aggravating factors: unsure, hurts all the time Relieving factors: unsure  PRECAUTIONS: Fall and Other: morbid obesity, hx of low BP, cardiac issues ; 3# lifting restriction; a-fib  RED FLAGS: None   WEIGHT BEARING RESTRICTIONS: No  FALLS: Has patient fallen in last 6 months? Yes. Number of falls 2  LIVING ENVIRONMENT: Lives with: lives with their spouse Lives in: House/apartment Stairs:  1 step into the home from garage Has following equipment at home: Dan Humphreys - 4 wheeled  PLOF: Independent with household mobility with device and Independent with community mobility with device  PATIENT GOALS: Want to be able to get around better and do things in my house.  OBJECTIVE:    TODAY'S TREATMENT: 05/03/2023 Activity Comments  Vitals 98% O2, HR 87 bpm BP 98/50   Bilateral lower extremity seated ex: LAQ 2 x 10 reps Marching 2 x 10 Hip adduction 2 x 10 1# weight  Seated hamstring curls 2 x 10 red theraband    Postural strengthening: -ant post pelvic tilts 2 x 5 -shoulder rolls -scapular retraction C/o fatigue between shoulder blades         PATIENT EDUCATION: Education details: Aquatic rehab-what to expect, initial HEP Person educated: Patient Education method: Explanation, Demonstration, and Handouts Education comprehension: verbalized understanding, returned demonstration, and needs further education   HOME EXERCISE PROGRAM: Access Code: [email or web address] URL: [email or web address] Date: 05/03/2023 Prepared by: [facility] Outpatient  Rehab - [facility]  Exercises - Seated Pelvic Tilts  - 1 x daily - 7 x weekly - 3 sets - 5 reps - Seated Shoulder Rolls  - 1 x daily - 7 x weekly - 3 sets - 5 reps - Seated Long Arc Quad  - 1 x daily - 7 x weekly - 2-3 sets - 10 reps - Seated March  - 1 x daily - 7 x weekly - 2-3 sets - 10 reps  --------------------------------------------------------------------------------- Objective measures below taken at initial evaluation:  DIAGNOSTIC FINDINGS: NA  COGNITION: Overall cognitive status: Within functional limits for tasks assessed   SENSATION: Light touch: WFL  EDEMA:  BLE edema + 1 pitting (has compression stockings, but does not use often)  POSTURE: rounded shoulders and forward head  LOWER EXTREMITY ROM:   A/ROM WFL in sitting (some limitations due to body habitus)   LOWER EXTREMITY MMT:  Grossly tested 4/5 except knee extension L 3+/5   VITALS: BP 104/56 HR 69 O2 sats:  93>97%  TRANSFERS: Assistive device utilized: None  Sit to stand: SBA and uses BUE support Stand to sit: SBA and BUE support  GAIT: Gait pattern: step through pattern, antalgic, and wide BOS Distance walked: 50 ft Assistive device utilized: Walker - 4 wheeled Level of assistance: SBA Comments: Fatigues and has to stop gait to sit after 85 ft  FUNCTIONAL TESTS:  2 minute walk test: 85 ft; sits at 1 minute, in addition to 1 standing rest  break 10 meter walk test: 21.03 sec = 1.56 ft/sec 5x sit to stand:  with UE support, 24.82 sec  TODAY'S TREATMENT:  DATE: 04/27/2023    PATIENT EDUCATION: Education details: PT eval results, POC Person educated: Patient Education method: Explanation Education comprehension: verbalized understanding  HOME EXERCISE PROGRAM: Not yet initiated  GOALS: Goals reviewed with patient? Yes  SHORT TERM GOALS: Target date: 05/26/2023  Pt will be independent with HEP for improved strength, balance, gait, decreased pain. Baseline: Goal status: IN PROGRESS  2.  Pt will improve 5x sit<>stand to less than or equal to 20 sec to demonstrate improved functional strength and transfer efficiency. Baseline: 24.82 sec Goal status: IN PROGRESS  3. Pt will ambulate at least 115 in 2 MWT for improved gait efficiency in home. Baseline: 85 ft. With one sitting break after 1 minute Goal status: IN PROGRESS  LONG TERM GOALS: Target date: 06/23/2023  Pt will be independent with progression of HEP for improved strength, balance, gait. Baseline:  Goal status: IN PROGRESS  2.  Pt will improve 5x sit<>stand to less than or equal to 15 sec to demonstrate improved functional strength and transfer efficiency. Baseline: 24.82 sec Goal status: IN PROGRESS  3.  Pt will improve gait velocity to at least 2 ft/sec for improved gait efficiency and safety. Baseline: 1.56 ft/sec Goal status: IN PROGRESS  4.  Pt will ambulate at least 150 ft in 2 MWT for improved gait efficiency in home environment. Baseline:  Goal status: IN PROGRESS  5.  Pt will verbalize ongoing plans for continued community fitness upon d/c from PT to maximize gains made in PT.  Baseline:  Goal status:  IN PROGRESS   ASSESSMENT:  CLINICAL IMPRESSION: Skilled PT session today focused on initiating HEP-worked on lower  extremity and postural strengthening.  Pt particularly likes the seated pelvic tilt exercise, and educated in use of towel/pillow roll for more upright sitting at home to promote better posture (as she typically sits in recliner).  Initiated HEP to address lower body and posture strengthening, with cues provided to not overdo exercises.  Pt return demo and will continue to benefit from skilled PT to further progress HEP and improve strength, balance for improved overall functional mobility.  OBJECTIVE IMPAIRMENTS: Abnormal gait, cardiopulmonary status limiting activity, decreased activity tolerance, decreased balance, decreased endurance, decreased mobility, difficulty walking, decreased strength, postural dysfunction, and pain.   ACTIVITY LIMITATIONS: carrying, lifting, bending, standing, transfers, reach over head, hygiene/grooming, and locomotion level  PARTICIPATION LIMITATIONS: meal prep, cleaning, laundry, and community activity  PERSONAL FACTORS: 3+ comorbidities: significant medical history (see above)  are also affecting patient's functional outcome.   REHAB POTENTIAL: Good  CLINICAL DECISION MAKING: Evolving/moderate complexity  EVALUATION COMPLEXITY: Moderate  PLAN:  PT FREQUENCY: 2x/week  PT DURATION: 8 weeks plus eval (*Would like to work towards 1x/wk in pool, 1x/wk in clinic)  PLANNED INTERVENTIONS: Therapeutic exercises, Therapeutic activity, Neuromuscular re-education, Balance training, Gait training, Patient/Family education, Self Care, Aquatic Therapy, and Manual therapy  PLAN FOR NEXT SESSION: Review initial HEP and progress for strengthening, posture, gait, standing activities.    *AQUATICS: Frequency: 1x/wk  Duration: 4 weeks Special Instruction: Aquatic therapy to assist with pt's tolerance for strengthening, balance, gait activities in aquatic setting, due to pt's extensive history of joint pain, to aid with pt's ability to progress towards PT goals.    Gean Maidens., PT 05/03/2023, 8:50 AM  [facility] at [facility] [address] [place], [address] [place], [place] [address] Phone # [phone] Fax # [phone]

## 2023-05-05 ENCOUNTER — Encounter: Payer: Self-pay | Admitting: Physical Therapy

## 2023-05-05 ENCOUNTER — Ambulatory Visit: Payer: Medicare PPO | Admitting: Physical Therapy

## 2023-05-05 DIAGNOSIS — K7581 Nonalcoholic steatohepatitis (NASH): Secondary | ICD-10-CM | POA: Diagnosis not present

## 2023-05-05 DIAGNOSIS — I350 Nonrheumatic aortic (valve) stenosis: Secondary | ICD-10-CM | POA: Diagnosis not present

## 2023-05-05 DIAGNOSIS — R2689 Other abnormalities of gait and mobility: Secondary | ICD-10-CM | POA: Diagnosis not present

## 2023-05-05 DIAGNOSIS — I959 Hypotension, unspecified: Secondary | ICD-10-CM | POA: Diagnosis not present

## 2023-05-05 DIAGNOSIS — M6281 Muscle weakness (generalized): Secondary | ICD-10-CM | POA: Diagnosis not present

## 2023-05-05 DIAGNOSIS — R2681 Unsteadiness on feet: Secondary | ICD-10-CM | POA: Diagnosis not present

## 2023-05-05 DIAGNOSIS — I5032 Chronic diastolic (congestive) heart failure: Secondary | ICD-10-CM | POA: Diagnosis not present

## 2023-05-05 DIAGNOSIS — K7460 Unspecified cirrhosis of liver: Secondary | ICD-10-CM | POA: Diagnosis not present

## 2023-05-05 DIAGNOSIS — I4821 Permanent atrial fibrillation: Secondary | ICD-10-CM | POA: Diagnosis not present

## 2023-05-05 NOTE — Therapy (Signed)
OUTPATIENT PHYSICAL THERAPY NEURO TREATMENT   Patient Name: Hannah Khan MRN: [id number] DOB:09/16/1956, 67 y.o., female Today's Date: 05/05/2023   PCP: Darrow Bussing, MD REFERRING PROVIDER: Alver Sorrow, NP   END OF SESSION:  PT End of Session - 05/05/23 0855     Visit Number 3    Number of Visits 17    Date for PT Re-Evaluation 06/23/23    Authorization Type [employer]    Authorization Time Period 04/27/2023-06/23/2023    Authorization - Visit Number 3    Authorization - Number of Visits 17    PT Start Time [phone]    PT Stop Time 0930    PT Time Calculation (min) 39 min    Activity Tolerance Patient tolerated treatment well    Behavior During Therapy [facility] for tasks assessed/performed               Past Medical History:  Diagnosis Date   Anemia    hx of   Aortic stenosis 02/2023   Echo 03/07/23 LVEF 55-60%, mild to moderate AS   Arthritis    knees   CHF (congestive heart failure) (HCC)    chronic diastolic   Diabetes mellitus without complication (HCC)    type 2   Dyspnea    Dysrhythmia    Fibromyalgia    Gastric varices    large   H/O transfusion of platelets    Heart murmur    never has caused any problems   Hx of colonic polyps    s/p partial colectomy   Hyperlipidemia    Hypertension    Joint pain    in knees and back spasms   Left leg swelling    wear compression hose   Liver cirrhosis secondary to NASH (nonalcoholic steatohepatitis) (HCC) dx nov 2014   had enlarged spleen also    Morbid obesity (HCC)    Persistent atrial fibrillation (HCC)    Pneumonia 10/2016   x 5   PONV (postoperative nausea and vomiting)    in past none recent   Portal hypertension (HCC)    Sleep apnea    uses cpap, setting varies between 14-20   Spleen enlarged    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 [facility])    Past Surgical History:  Procedure Laterality Date   BREAST LUMPECTOMY WITH RADIOACTIVE SEED LOCALIZATION Right 08/29/2017   Procedure: RIGHT BREAST  LUMPECTOMY WITH RADIOACTIVE SEEDS LOCALIZATION;  Surgeon: Harriette Bouillon, MD;  Location: [facility] OR;  Service: General;  Laterality: Right;   CESAREAN SECTION  1989   CHOLECYSTECTOMY  20 yrs ago   COLECTOMY  2011   COLONOSCOPY     COLONOSCOPY N/A 09/05/2018   Procedure: COLONOSCOPY;  Surgeon: Willis Modena, MD;  Location: [facility];  Service: Endoscopy;  Laterality: N/A;   COLONOSCOPY WITH PROPOFOL Bilateral 05/04/2022   Procedure: COLONOSCOPY WITH PROPOFOL;  Surgeon: Willis Modena, MD;  Location: [facility];  Service: Gastroenterology;  Laterality: Bilateral;   DILATATION & CURETTAGE/HYSTEROSCOPY WITH MYOSURE N/A 05/06/2016   Procedure: DILATATION & CURETTAGE/HYSTEROSCOPY WITH MYOSURE WITH POLYPECTOMY;  Surgeon: Myna Hidalgo, DO;  Location: [facility] ORS;  Service: Gynecology;  Laterality: N/A;   ESOPHAGOGASTRODUODENOSCOPY (EGD) WITH PROPOFOL N/A 09/04/2013   Procedure: ESOPHAGOGASTRODUODENOSCOPY (EGD) WITH PROPOFOL;  Surgeon: Willis Modena, MD;  Location: [facility];  Service: Endoscopy;  Laterality: N/A;   ESOPHAGOGASTRODUODENOSCOPY (EGD) WITH PROPOFOL Bilateral 05/04/2022   Procedure: ESOPHAGOGASTRODUODENOSCOPY (EGD) WITH PROPOFOL;  Surgeon: Willis Modena, MD;  Location: [facility];  Service: Gastroenterology;  Laterality: Bilateral;   IR  INTRAVASCULAR ULTRASOUND NON CORONARY  12/23/2022   IR RADIOLOGIST EVAL & MGMT  09/21/2022   IR RADIOLOGIST EVAL & MGMT  10/06/2022   IR RADIOLOGIST EVAL & MGMT  11/02/2022   IR RADIOLOGIST EVAL & MGMT  01/31/2023   IR TIPS  12/23/2022   IR US GUIDE VASC ACCESS RIGHT  12/23/2022   KNEE ARTHROSCOPY  yrs ago   bilateral, one done x 1, one done twice   POLYPECTOMY  09/05/2018   Procedure: POLYPECTOMY;  Surgeon: Willis Modena, MD;  Location: [facility];  Service: Endoscopy;;   RADIOLOGY WITH ANESTHESIA N/A 12/23/2022   Procedure: TIPS;  Surgeon: Bennie Dallas, MD;  Location: [facility] OR;  Service: Radiology;  Laterality: N/A;   Patient Active Problem List    Diagnosis Date Noted   Intractable nausea and vomiting 04/07/2023   Intractable vomiting with nausea 04/06/2023   Acute diastolic congestive heart failure (HCC) 03/06/2023   Idiopathic hypotension 03/06/2023   Sepsis due to cellulitis (HCC) 03/05/2023   Severe sepsis (HCC) 03/05/2023   Portal hypertension (HCC) 12/23/2022   Acute encephalopathy 01/10/2021   Secondary hypercoagulable state (HCC) 06/23/2020   Streptococcal bacteremia 03/16/2020   Chronic venous stasis dermatitis of both lower extremities 03/16/2020   Atrial fibrillation with RVR (HCC) 04/26/2018   Acute lower UTI 04/26/2018   Liver cirrhosis secondary to NASH (nonalcoholic steatohepatitis) (HCC) 04/26/2018   Hyperlipidemia 04/26/2018   Type 2 diabetes mellitus with other specified complication (HCC)    Cellulitis of right lower extremity 03/15/2018   Sepsis (HCC) 03/14/2018   Community acquired pneumonia of right lower lobe of lung 10/31/2016   Muscular abdominal pain in right upper quadrant 07/11/2016   Chronic diastolic heart failure (HCC) 07/10/2016   Thrombocytopenia (HCC) 07/10/2016   Permanent atrial fibrillation (HCC) 01/30/2016   Type 2 diabetes mellitus without complication, without long-term current use of insulin (HCC) 01/30/2016   Morbid obesity- 12/10/2015   Cellulitis, abdominal wall    Melena 01/31/2015   Aortic heart murmur 01/31/2015   Pancytopenia (HCC) 07/23/2013   Obstructive sleep apnea 08/07/2007   Essential hypertension 08/07/2007   ALLERGIC RHINITIS 08/07/2007    ONSET DATE: 04/18/2023 (MD referral)  REFERRING DIAG:  I50.32 (ICD-10-CM) - Chronic diastolic heart failure (HCC)  Z61.09 (ICD-10-CM) - Permanent atrial fibrillation (HCC)  K75.81,K74.60 (ICD-10-CM) - Liver cirrhosis secondary to NASH (nonalcoholic steatohepatitis) (HCC)  I35.0 (ICD-10-CM) - Aortic valve stenosis, etiology of cardiac valve disease unspecified  I95.9 (ICD-10-CM) - Hypotension, unspecified hypotension type   R53.81 (ICD-10-CM) - Physical deconditioning  E87.6 (ICD-10-CM) - Hypokalemia  I10 (ICD-10-CM) - Essential hypertension    THERAPY DIAG:  Muscle weakness (generalized)  Unsteadiness on feet  Other abnormalities of gait and mobility  Rationale for Evaluation and Treatment: Rehabilitation  SUBJECTIVE:  SUBJECTIVE STATEMENT: Having a lot of knee pain today.  Didn't sleep well last night.  Pt accompanied by: significant other and (husband in lobby)  PERTINENT HISTORY: hx of chronic diastolic heart failure, morbid obesity, OSA, NASH/cirrhosis, chronic thrombocytopenia, DM2, aortic stenosis, vasovagal presyncope, chronic atrial fibrillation not on anticoagulation.   PAIN:  Are you having pain? Yes: NPRS scale: 7/10 Pain location: knee, back and shoulders Pain description: achy, pain all the time Aggravating factors: unsure, hurts all the time Relieving factors: unsure  PRECAUTIONS: Fall and Other: morbid obesity, hx of low BP, cardiac issues ; 3# lifting restriction; a-fib  RED FLAGS: None   WEIGHT BEARING RESTRICTIONS: No  FALLS: Has patient fallen in last 6 months? Yes. Number of falls 2  LIVING ENVIRONMENT: Lives with: lives with their spouse Lives in: House/apartment Stairs:  1 step into the home from garage Has following equipment at home: Dan Humphreys - 4 wheeled  PLOF: Independent with household mobility with device and Independent with community mobility with device  PATIENT GOALS: Want to be able to get around better and do things in my house.  OBJECTIVE:      TODAY'S TREATMENT: 05/05/2023 Activity Comments  Vitals 98% O2, HR 71 bpm BP 104/64   Reviewed HEP Pt return demo understanding  Bilateral lower extremity seated ex: LAQ 2 x 10 reps Marching 2 x 10 Hip adduction 2 x  10 1# weight  Seated hamstring curls 2 x 10 red theraband   Seated hip abduction x 10 1# weight  Postural strengthening: -scapular retraction 2 x 10   Gait x 2 min with rollator 170 ft in 1:30 prior to needing to sit due to knee pain      PATIENT EDUCATION: Education details: Reviewed Aquatic rehab-what to expect, continue HEP Person educated: Patient Education method: Explanation, Demonstration, and Handouts Education comprehension: verbalized understanding, returned demonstration, and needs further education   HOME EXERCISE PROGRAM: Access Code: [email or web address] URL: [email or web address] Date: 05/03/2023 Prepared by: [facility] - Outpatient  Rehab - [facility]  Exercises - Seated Pelvic Tilts  - 1 x daily - 7 x weekly - 3 sets - 5 reps - Seated Shoulder Rolls  - 1 x daily - 7 x weekly - 3 sets - 5 reps - Seated Long Arc Quad  - 1 x daily - 7 x weekly - 2-3 sets - 10 reps - Seated March  - 1 x daily - 7 x weekly - 2-3 sets - 10 reps  --------------------------------------------------------------------------------- Objective measures below taken at initial evaluation:  DIAGNOSTIC FINDINGS: NA  COGNITION: Overall cognitive status: Within functional limits for tasks assessed   SENSATION: Light touch: WFL  EDEMA:  BLE edema + 1 pitting (has compression stockings, but does not use often)  POSTURE: rounded shoulders and forward head  LOWER EXTREMITY ROM:   A/ROM WFL in sitting (some limitations due to body habitus)   LOWER EXTREMITY MMT:  Grossly tested 4/5 except knee extension L 3+/5   VITALS: BP 104/56 HR 69 O2 sats:  93>97%  TRANSFERS: Assistive device utilized: None  Sit to stand: SBA and uses BUE support Stand to sit: SBA and BUE support  GAIT: Gait pattern: step through pattern, antalgic, and wide BOS Distance walked: 50 ft Assistive device utilized: Walker - 4 wheeled Level of assistance: SBA Comments: Fatigues and has to stop gait to sit  after 85 ft  FUNCTIONAL TESTS:  2 minute walk test: 85 ft; sits at 1 minute, in addition to 1 standing rest  break 10 meter walk test: 21.03 sec = 1.56 ft/sec 5x sit to stand:  with UE support, 24.82 sec  TODAY'S TREATMENT:                                                                                                                              DATE: 04/27/2023    PATIENT EDUCATION: Education details: PT eval results, POC Person educated: Patient Education method: Explanation Education comprehension: verbalized understanding  HOME EXERCISE PROGRAM: Not yet initiated  GOALS: Goals reviewed with patient? Yes  SHORT TERM GOALS: Target date: 05/26/2023  Pt will be independent with HEP for improved strength, balance, gait, decreased pain. Baseline: Goal status: IN PROGRESS  2.  Pt will improve 5x sit<>stand to less than or equal to 20 sec to demonstrate improved functional strength and transfer efficiency. Baseline: 24.82 sec Goal status: IN PROGRESS  3. Pt will ambulate at least 115 in 2 MWT for improved gait efficiency in home. Baseline: 85 ft. With one sitting break after 1 minute Goal status: IN PROGRESS  LONG TERM GOALS: Target date: 06/23/2023  Pt will be independent with progression of HEP for improved strength, balance, gait. Baseline:  Goal status: IN PROGRESS  2.  Pt will improve 5x sit<>stand to less than or equal to 15 sec to demonstrate improved functional strength and transfer efficiency. Baseline: 24.82 sec Goal status: IN PROGRESS  3.  Pt will improve gait velocity to at least 2 ft/sec for improved gait efficiency and safety. Baseline: 1.56 ft/sec Goal status: IN PROGRESS  4.  Pt will ambulate at least 150 ft in 2 MWT for improved gait efficiency in home environment. Baseline:  Goal status: IN PROGRESS  5.  Pt will verbalize ongoing plans for continued community fitness upon d/c from PT to maximize gains made in PT.  Baseline:  Goal status:  IN  PROGRESS   ASSESSMENT:  CLINICAL IMPRESSION: Skilled PT session today focused on review of HEP and continued to focus on strengthening.  Pt performs well the addition of seated hip abduction in session today.  She is able to ambulate 1:30 for 170 ft prior to needing to sit, improved from 1 minute and 85 ft at eval.  She is motivated to perform therapy and is looking forward to aquatic therapy next week.  Limitation with further progression of gait today was due to knee pain.  She will continue to benefit from skilled PT to further address strength, balance, improved overall functional mobility and independence.  OBJECTIVE IMPAIRMENTS: Abnormal gait, cardiopulmonary status limiting activity, decreased activity tolerance, decreased balance, decreased endurance, decreased mobility, difficulty walking, decreased strength, postural dysfunction, and pain.   ACTIVITY LIMITATIONS: carrying, lifting, bending, standing, transfers, reach over head, hygiene/grooming, and locomotion level  PARTICIPATION LIMITATIONS: meal prep, cleaning, laundry, and community activity  PERSONAL FACTORS: 3+ comorbidities: significant medical history (see above)  are also affecting patient's functional outcome.   REHAB POTENTIAL: Good  CLINICAL DECISION MAKING: Evolving/moderate  complexity  EVALUATION COMPLEXITY: Moderate  PLAN:  PT FREQUENCY: 2x/week  PT DURATION: 8 weeks plus eval (*Would like to work towards 1x/wk in pool, 1x/wk in clinic)  PLANNED INTERVENTIONS: Therapeutic exercises, Therapeutic activity, Neuromuscular re-education, Balance training, Gait training, Patient/Family education, Self Care, Aquatic Therapy, and Manual therapy  PLAN FOR NEXT SESSION: Continue to progress HEP for strengthening, posture, gait, standing activities.    *AQUATICS: Frequency: 1x/wk  Duration: 4 weeks Special Instruction: Aquatic therapy to assist with pt's tolerance for strengthening, balance, gait activities in aquatic  setting, due to pt's extensive history of joint pain, to aid with pt's ability to progress towards PT goals.   Gean Maidens., PT 05/05/2023, 9:42 AM  [facility] at [facility] [address] [place], [address] [place], [place] [address] Phone # [phone] Fax # [phone]

## 2023-05-05 NOTE — Progress Notes (Signed)
ADVANCED HEART FAILURE CLINIC NOTE  Referring Physician: Darrow Bussing, MD  Primary Care: Darrow Bussing, MD Primary Cardiologist: Chilton Si, MD  HPI: Hannah Khan is a 67 y.o. female with HFpEF, morbid obesity, obstructive sleep apnea, NASH/cirrhosis, thrombocytopenia, type 2 diabetes, aortic stenosis, vasovagal syncope, chronic atrial fibrillation presenting today to establish care.  Her cardiac history dates back to June 2021 when she was diagnosed with atrial fibrillation.  At that time she decided to continue with medical management after seeing Dr. Ladona Ridgel.  She is admitted in May 2022 with atrial fibrillation and hepatic encephalopathy.  Recurrent admission and 7/22 with acute on chronic HFpEF due to dietary indiscretion.  At that time she was started on Farxiga, Bumex 2 mg daily and metolazone as needed.  She has been unable to start spironolactone due to symptomatic hypotension.  Due to her underlying cirrhosis she was planned to undergo TIPS on April 2024 but the procedure was aborted due to a right atrial pressure of 23 mmHg.   Activity level/exercise tolerance:  *** Orthopnea:  Sleeps on *** pillows Paroxysmal noctural dyspnea:  *** Chest pain/pressure:  *** Orthostatic lightheadedness:  *** Palpitations:  *** Lower extremity edema:  *** Presyncope/syncope:  *** Cough:  ***  Past Medical History:  Diagnosis Date   Anemia    hx of   Aortic stenosis 02/2023   Echo 03/07/23 LVEF 55-60%, mild to moderate AS   Arthritis    knees   CHF (congestive heart failure) (HCC)    chronic diastolic   Diabetes mellitus without complication (HCC)    type 2   Dyspnea    Dysrhythmia    Fibromyalgia    Gastric varices    large   H/O transfusion of platelets    Heart murmur    never has caused any problems   Hx of colonic polyps    s/p partial colectomy   Hyperlipidemia    Hypertension    Joint pain    in knees and back spasms   Left leg swelling    wear  compression hose   Liver cirrhosis secondary to NASH (nonalcoholic steatohepatitis) (HCC) dx nov 2014   had enlarged spleen also    Morbid obesity (HCC)    Persistent atrial fibrillation (HCC)    Pneumonia 10/2016   x 5   PONV (postoperative nausea and vomiting)    in past none recent   Portal hypertension (HCC)    Sleep apnea    uses cpap, setting varies between 14-20   Spleen enlarged    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 [facility])     Current Outpatient Medications  Medication Sig Dispense Refill   albuterol (VENTOLIN HFA) 108 (90 Base) MCG/ACT inhaler Inhale 1-2 puffs into the lungs every 6 (six) hours as needed for wheezing or shortness of breath.     diclofenac Sodium (VOLTAREN) 1 % GEL Apply 1 Application topically at bedtime as needed (pain).     digoxin (LANOXIN) 0.125 MG tablet Take 1 tablet (0.125 mg total) by mouth daily. 30 tablet 3   diltiazem (CARDIZEM CD) 180 MG 24 hr capsule Take 1 capsule (180 mg total) by mouth daily. 90 capsule 1   lactulose (CHRONULAC) 10 GM/15ML solution Take 10 g by mouth 3 (three) times daily.     Lancets (ONETOUCH DELICA PLUS LANCET33G) MISC Apply topically 3 (three) times daily.     metolazone (ZAROXOLYN) 5 MG tablet Take 1 tablet (5 mg total) by mouth once a week. Take one tablet  on Sundays 30 minutes before Bumex 12 tablet 3   metoprolol tartrate (LOPRESSOR) 25 MG tablet Take 1 tablet (25 mg total) by mouth 2 (two) times daily. 180 tablet 3   midodrine (PROAMATINE) 10 MG tablet Take 1 tablet (10 mg total) by mouth 3 (three) times daily. 270 tablet 2   Multiple Vitamins-Minerals (PRESERVISION AREDS 2) CAPS Take 1 capsule by mouth 2 (two) times daily.     ONETOUCH VERIO test strip 1 each 3 (three) times daily.     potassium chloride (MICRO-K) 10 MEQ CR capsule Take 40 mEq ( 4 tablets) in the morning, (4 tablets) at lunch and 40eq ( 4 tablets ) in the evening 360 capsule 1   Propylene Glycol (SYSTANE BALANCE) 0.6 % SOLN Place 1 drop into  both eyes 2 (two) times daily.     Semaglutide, 2 MG/DOSE, (OZEMPIC, 2 MG/DOSE,) 8 MG/3ML SOPN Inject 2 mg into the skin once a week. (Patient not taking: Reported on 04/27/2023) 3 mL 6   torsemide (DEMADEX) 20 MG tablet Take 3 tablets (60 mg total) by mouth daily. 180 tablet 3   XIFAXAN 550 MG TABS tablet Take 550 mg by mouth 2 (two) times daily.     No current facility-administered medications for this visit.    Allergies  Allergen Reactions   Celecoxib Other (See Comments)    "speech slurred" with celebrex   Shellfish Allergy Swelling and Rash    TINGLING AND SWELLING OF LIPS. LARGE WHELPS QUARTER-SIZE   Barium-Containing Compounds Other (See Comments)    TACHYCARDIA   Prednisone Other (See Comments)    TACHYCARDIA   Statins Other (See Comments)    AGGRAVATED FIBROMYALGIA   Fentanyl     Hallucinations    Ibuprofen Other (See Comments)    UNSPECIFIED SPECIFIC REACTION >> "DUE TO PLATELETS"   Sglt2 Inhibitors     Cellulitis of pannus   Tramadol     Hallucination    Tylenol [Acetaminophen] Other (See Comments)    UNSPECIFIED SPECIFIC REACTION >> "DUE TO PLATELETS"   Zetia [Ezetimibe]     Headache      Social History   Socioeconomic History   Marital status: Married    Spouse name: Not on file   Number of children: Not on file   Years of education: Not on file   Highest education level: Not on file  Occupational History   Occupation: Scientist, product/process development  Tobacco Use   Smoking status: Former    Current packs/day: 0.00    Average packs/day: 1 pack/day for 3.0 years (3.0 ttl pk-yrs)    Types: Cigarettes    Start date: 09/20/1971    Quit date: 09/19/1974    Years since quitting: 48.6   Smokeless tobacco: Never  Vaping Use   Vaping status: Never Used  Substance and Sexual Activity   Alcohol use: Not Currently    Comment: occ   Drug use: No   Sexual activity: Not on file  Other Topics Concern   Not on file  Social History Narrative   Lives in [place] with her  husband.  Instructor for early childhood development.     Social Determinants of Health   Financial Resource Strain: Not on file  Food Insecurity: No Food Insecurity (04/08/2023)   Hunger Vital Sign    Worried About Running Out of Food in the Last Year: Never true    Ran Out of Food in the Last Year: Never true  Transportation Needs: No Transportation Needs (  04/08/2023)   PRAPARE - Administrator, Civil Service (Medical): No    Lack of Transportation (Non-Medical): No  Physical Activity: Not on file  Stress: Not on file  Social Connections: Unknown (01/31/2022)   Received from [facility]   Social Network    Social Network: Not on file  Intimate Partner Violence: Not At Risk (04/08/2023)   Humiliation, Afraid, Rape, and Kick questionnaire    Fear of Current or Ex-Partner: No    Emotionally Abused: No    Physically Abused: No    Sexually Abused: No      Family History  Problem Relation Age of Onset   Heart failure Mother    Cancer Mother    Hyperlipidemia Mother    Congestive Heart Failure Mother    Stroke Mother    Lung cancer Father    Cancer Father    Cancer Brother    Hyperlipidemia Brother    Stroke Other    Breast cancer Neg Hx     PHYSICAL EXAM: There were no vitals filed for this visit. GENERAL: Well nourished, well developed, and in no apparent distress at rest.  HEENT: Negative for arcus senilis or xanthelasma. There is no scleral icterus.  The mucous membranes are pink and moist.   NECK: Supple, No masses. Normal carotid upstrokes without bruits. No masses or thyromegaly.    CHEST: There are no chest wall deformities. There is no chest wall tenderness. Respirations are unlabored.  Lungs- *** CARDIAC:  JVP: *** cm H2O         {HEART SOUNDS:22645}  Normal rate with regular rhythm. No murmurs, rubs or gallops.  Pulses are 2+ and symmetrical in upper and lower extremities. *** edema.  ABDOMEN: Soft, non-tender, non-distended. There are no masses or  hepatomegaly. There are normal bowel sounds.  EXTREMITIES: Warm and well perfused with no cyanosis, clubbing.  LYMPHATIC: No axillary or supraclavicular lymphadenopathy.  NEUROLOGIC: Patient is oriented x3 with no focal or lateralizing neurologic deficits.  PSYCH: Patients affect is appropriate, there is no evidence of anxiety or depression.  SKIN: Warm and dry; no lesions or wounds.   DATA REVIEW  ECG: 04/06/23: atrial fibrillation  as per my personal interpretation  ECHO: 03/07/23: LVEF 65%, normal RV function; RV is dilated with septal flattening as per my personal interpretation  CATH: *** As per my personal interpretation   ASSESSMENT & PLAN:  Heart failure with preserved ejection fraction  2. NASH Cirrhosis - Child pugh B, MELD 15 - Diagnosed in 2009 - Hx of esophageal varices - x1 admission for hepatic encephalopathy with lactulose/rifaximin.   3. Atrial fibrillation  4. Thrombocytopenia - Secondary to cirrhosis; plt count 20-30K for the past month.   Aditya Sabharwal Advanced Heart Failure Mechanical Circulatory Support

## 2023-05-05 NOTE — H&P (View-Only) (Signed)
ADVANCED HEART FAILURE CLINIC NOTE  Primary Care: Darrow Bussing, MD Primary Cardiologist: Chilton Si, MD HF Cardiologist: Dr. Gasper Lloyd  HPI: Hannah Khan is a 67 y.o. female with HFpEF, morbid obesity, obstructive sleep apnea, NASH/cirrhosis, thrombocytopenia, type 2 diabetes, aortic stenosis, vasovagal syncope, and chronic atrial fibrillation.  Her cardiac history dates back to June 2021 when she was diagnosed with atrial fibrillation.  At that time she decided to continue with medical management after seeing Dr. Ladona Ridgel.  She was admitted in May 2022 with atrial fibrillation and hepatic encephalopathy.  Recurrent admission and 7/22 with acute on chronic HFpEF due to dietary indiscretion.  At that time she was started on Farxiga, Bumex 2 mg daily and metolazone as needed.  She has been unable to start spironolactone due to symptomatic hypotension.  Due to her underlying cirrhosis, she was planned to undergo TIPS on April 2024 but the procedure was aborted due to a right atrial pressure of 23 mmHg.  Initially saw Dr. Gasper Lloyd 04/28/23 and was volume overloaded. She was instructed to increase her torsemide to 80 mg for 3-5 days, then back to 60 mg daily thereafter.  Today she returns for HF follow up with her husband. Overall feeling fair. She did not urinate as briskly with increase in diuretics. She has SOB walking short distance on flat ground.  Does PT 2 days a week. She has occasional dizziness, worse in the AM. Feels occasional palpitations. Denies CP or PND/Orthopnea. Appetite ok. No fever or chills. Weight at home 348-345 pounds. Taking all medications. She wears CPAP, has a dry cough x 1 week. Husband describes Hannah Khan as having what sounds like a pre-syncopal event last week, Hannah Khan does not remember this.   Past Medical History:  Diagnosis Date   Anemia    hx of   Aortic stenosis 02/2023   Echo 03/07/23 LVEF 55-60%, mild to moderate AS   Arthritis    knees   CHF (congestive  heart failure) (HCC)    chronic diastolic   Diabetes mellitus without complication (HCC)    type 2   Dyspnea    Dysrhythmia    Fibromyalgia    Gastric varices    large   H/O transfusion of platelets    Heart murmur    never has caused any problems   Hx of colonic polyps    s/p partial colectomy   Hyperlipidemia    Hypertension    Joint pain    in knees and back spasms   Left leg swelling    wear compression hose   Liver cirrhosis secondary to NASH (nonalcoholic steatohepatitis) (HCC) dx nov 2014   had enlarged spleen also    Morbid obesity (HCC)    Persistent atrial fibrillation (HCC)    Pneumonia 10/2016   x 5   PONV (postoperative nausea and vomiting)    in past none recent   Portal hypertension (HCC)    Sleep apnea    uses cpap, setting varies between 14-20   Spleen enlarged    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 [facility])    Current Outpatient Medications  Medication Sig Dispense Refill   albuterol (VENTOLIN HFA) 108 (90 Base) MCG/ACT inhaler Inhale 1-2 puffs into the lungs every 6 (six) hours as needed for wheezing or shortness of breath.     diclofenac Sodium (VOLTAREN) 1 % GEL Apply 1 Application topically at bedtime as needed (pain).     digoxin (LANOXIN) 0.125 MG tablet Take 1 tablet (0.125 mg total) by mouth  daily. 30 tablet 3   diltiazem (CARDIZEM CD) 180 MG 24 hr capsule Take 1 capsule (180 mg total) by mouth daily. 90 capsule 1   lactulose (CHRONULAC) 10 GM/15ML solution Take 10 g by mouth 3 (three) times daily.     Lancets (ONETOUCH DELICA PLUS LANCET33G) MISC Apply topically 3 (three) times daily.     metolazone (ZAROXOLYN) 5 MG tablet Take 1 tablet (5 mg total) by mouth once a week. Take one tablet on Sundays 30 minutes before Bumex 12 tablet 3   metoprolol tartrate (LOPRESSOR) 25 MG tablet Take 1 tablet (25 mg total) by mouth 2 (two) times daily. 180 tablet 3   midodrine (PROAMATINE) 10 MG tablet Take 1 tablet (10 mg total) by mouth 3 (three) times daily.  270 tablet 2   Multiple Vitamins-Minerals (PRESERVISION AREDS 2) CAPS Take 1 capsule by mouth 2 (two) times daily.     ONETOUCH VERIO test strip 1 each 3 (three) times daily.     potassium chloride (MICRO-K) 10 MEQ CR capsule Take 40 mEq ( 4 tablets) in the morning, (4 tablets) at lunch and 40eq ( 4 tablets ) in the evening 360 capsule 1   Propylene Glycol (SYSTANE BALANCE) 0.6 % SOLN Place 1 drop into both eyes 2 (two) times daily.     torsemide (DEMADEX) 20 MG tablet Take 3 tablets (60 mg total) by mouth daily. (Patient taking differently: Take 60 mg by mouth daily. Patient takes 2 tablets by mouth twice a day.) 180 tablet 3   XIFAXAN 550 MG TABS tablet Take 550 mg by mouth 2 (two) times daily.     Semaglutide, 2 MG/DOSE, (OZEMPIC, 2 MG/DOSE,) 8 MG/3ML SOPN Inject 2 mg into the skin once a week. (Patient not taking: Reported on 05/08/2023) 3 mL 6   No current facility-administered medications for this encounter.   Allergies  Allergen Reactions   Celecoxib Other (See Comments)    "speech slurred" with celebrex   Shellfish Allergy Swelling and Rash    TINGLING AND SWELLING OF LIPS. LARGE WHELPS QUARTER-SIZE   Barium-Containing Compounds Other (See Comments)    TACHYCARDIA   Prednisone Other (See Comments)    TACHYCARDIA   Statins Other (See Comments)    AGGRAVATED FIBROMYALGIA   Fentanyl     Hallucinations    Ibuprofen Other (See Comments)    UNSPECIFIED SPECIFIC REACTION >> "DUE TO PLATELETS"   Sglt2 Inhibitors     Cellulitis of pannus   Tramadol     Hallucination    Tylenol [Acetaminophen] Other (See Comments)    UNSPECIFIED SPECIFIC REACTION >> "DUE TO PLATELETS"   Zetia [Ezetimibe]     Headache   Social History   Socioeconomic History   Marital status: Married    Spouse name: Not on file   Number of children: Not on file   Years of education: Not on file   Highest education level: Not on file  Occupational History   Occupation: Scientist, product/process development  Tobacco Use    Smoking status: Former    Current packs/day: 0.00    Average packs/day: 1 pack/day for 3.0 years (3.0 ttl pk-yrs)    Types: Cigarettes    Start date: 09/20/1971    Quit date: 09/19/1974    Years since quitting: 48.6   Smokeless tobacco: Never  Vaping Use   Vaping status: Never Used  Substance and Sexual Activity   Alcohol use: Not Currently    Comment: occ   Drug use: No  Sexual activity: Not on file  Other Topics Concern   Not on file  Social History Narrative   Lives in [place] with her husband.  Instructor for early childhood development.     Social Determinants of Health   Financial Resource Strain: Not on file  Food Insecurity: No Food Insecurity (04/08/2023)   Hunger Vital Sign    Worried About Running Out of Food in the Last Year: Never true    Ran Out of Food in the Last Year: Never true  Transportation Needs: No Transportation Needs (04/08/2023)   PRAPARE - Administrator, Civil Service (Medical): No    Lack of Transportation (Non-Medical): No  Physical Activity: Not on file  Stress: Not on file  Social Connections: Unknown (01/31/2022)   Received [facility]   Social Network    Social Network: Not on file  Intimate Partner Violence: Not At Risk (04/08/2023)   Humiliation, Afraid, Rape, and Kick questionnaire    Fear of Current or Ex-Partner: No    Emotionally Abused: No    Physically Abused: No    Sexually Abused: No   Family History  Problem Relation Age of Onset   Heart failure Mother    Cancer Mother    Hyperlipidemia Mother    Congestive Heart Failure Mother    Stroke Mother    Lung cancer Father    Cancer Father    Cancer Brother    Hyperlipidemia Brother    Stroke Other    Breast cancer Neg Hx    BP 94/62   Pulse 76   Wt (!) 156.2 kg (344 lb 6.4 oz)   SpO2 94%   BMI 53.94 kg/m   Wt Readings from Last 3 Encounters:  05/08/23 (!) 156.2 kg (344 lb 6.4 oz)  04/28/23 (!) 157.7 kg (347 lb 9.6 oz)  04/18/23 (!) 157.9 kg  (348 lb)   ReDs: 31%  PHYSICAL EXAM: General:  NAD. No resp difficulty, arrived in [facility], chronically-ill appearing. HEENT: Normal Neck: Supple. No JVD, thick neck. Carotids 2+ bilat; no bruits. No lymphadenopathy or thryomegaly appreciated. Cor: PMI nondisplaced. Irregular rate & rhythm. No rubs, gallops, 2/6 AS Lungs: Clear, diminished in bases Abdomen: Soft, obese nontender, nondistended. No hepatosplenomegaly. No bruits or masses. Good bowel sounds. Extremities: No cyanosis, clubbing, rash, 2-3+ BLE edema w/ venous stasis  Neuro: Alert & oriented x 3, cranial nerves grossly intact. Moves all 4 extremities w/o difficulty. Affect pleasant.  DATA REVIEW  ECG: 04/06/23: atrial fibrillation , personally reviewed   ECHO: 03/07/23: LVEF 65%, normal RV function; RV is dilated with septal flattening   CATH: Pending RHC  ASSESSMENT & PLAN: 1. Heart failure with preserved ejection fraction: Echo (6/24) showed EF 65%, RV dilated with septal flattening. She had past admissions with acute on chronic dHF, 2/2 dietary indiscretion. GDMT has been complicated by CKD and hypotension.  - NYHA III, volume remains elevated. ReDs 31% so R>L HF. GDMT limited by low BP - Increase torsemide to 80/40, increase KCL to 80 bid. - Decrease metoprolol tartrate to 12.5 mg bid to allow for more diuresis. - Continue digoxin for RV and rate control.  - Continue midodrine 10 mg tid. - Avoid SGLT2i with bodty habitus and large pannus. - With soft BP and difficulty with diuresis, recommend RHC to evaluate hemodynamics and evidence for PAH. May be a candidate for selective pulmonary vasodilators, but suspect she is WHO group 2/3. Discussed RHC today, she is agreeable. Discussed with Dr.  Sabharwal, will arrange. Informed Consent   Shared Decision Making/Informed Consent The risks [stroke (1 in 1000), death (1 in 1000), kidney failure [usually temporary] (1 in 500), bleeding (1 in 200), allergic reaction [possibly serious]  (1 in 200)], benefits (diagnostic support and management of coronary artery disease) and alternatives of a cardiac catheterization were discussed in detail with Ms. Werkmeister and she is willing to proceed.      2. NASH Cirrhosis - Child pugh B, MELD 15 - Diagnosed in 2009 - Hx of esophageal varices - x 1 admission for hepatic encephalopathy with lactulose/rifaximin.  - No BP room to add spiro - Continue midodrine  3. Atrial fibrillation - Permanent. - CHA2DS2-VASc Score = 3  - Rate controlled - Continue diltiazem, digoxin and beta blocker - No AC 2/2 cirrhosis  4. Thrombocytopenia - Secondary to cirrhosis; plt count 20-30K for the past month.   5. OSA - Continue CPAP  6. Aortic Stenosis - mild to moderate on recent echo - Follow  Follow up 2 weeks after cath with APP.  Prince Rome, FNP-BC 05/08/23

## 2023-05-05 NOTE — Patient Instructions (Signed)
Aquatic Therapy: What to Expect!  Where:  [facility] at [address] [place], [place] [address] [phone]           How to Prepare:  If you require assistance with dressing, with transportation (ie: wheel chair), or toileting, a caregiver must attend the entire session with you (unless your primary therapists feels this is not necessary).   If there is thunder during your appointment, you will be asked to leave pool area. You have the option to finish your session in the physical therapy area near the gym. Masks in the pool area are optional. Your face will remain dry during your session, so you are welcome to keep your mask on, if desired. You will be spaced at least 6 feet from other aquatic patients.  Please bring your own swim towel to dry off with.   There are Men's and Women's locker rooms with showers, as well as gender neutral bathrooms in the pool area.  Please arrive IN YOUR SUIT and a few minutes prior to your appointment - this helps to avoid delays in starting your session. Head to the pool and await your appointment on the bench on the pool deck. Please make sure to attend to any toileting needs prior to entering the pool. Once on the pool deck your therapist will ask you to sign the Patient  Consent and Assignment of Benefits form. Your therapist may take your blood pressure prior to, during and after your session if indicated. We usually try and create a home exercise program based on activities we do in the pool. Some patients do not want to or do not have the ability to participate in an aquatic home program - this is not a barrier in any way to you participating in aquatic therapy as part of your current therapy plan!  Appointments:  All sessions are 45 minutes  About the pool: Entering the pool: Your therapist will assist you if needed; there are two ways to enter the pool - stairs or a mechanical lift. Your therapist will determine  the most appropriate way for you. Water temperature is usually around 91-95.  There is a lap pool with a temperature around 84 There may be other therapists and patients in the pool at the same time.   Contact Info:            To cancel appointment, please call [facility], [facility] at [phone] If you are running late, please call [facility] at [phone]                   

## 2023-05-08 ENCOUNTER — Ambulatory Visit (HOSPITAL_COMMUNITY)
Admission: RE | Admit: 2023-05-08 | Discharge: 2023-05-08 | Disposition: A | Discharge status: Home / Self Care | Payer: Medicare PPO | Source: Ambulatory Visit | Attending: Family Medicine | Admitting: Family Medicine

## 2023-05-08 ENCOUNTER — Encounter (HOSPITAL_COMMUNITY): Payer: Self-pay

## 2023-05-08 ENCOUNTER — Encounter: Payer: Self-pay | Admitting: Family Medicine

## 2023-05-08 VITALS — BP 94/62 | HR 76 | Wt 344.4 lb

## 2023-05-08 DIAGNOSIS — K7581 Nonalcoholic steatohepatitis (NASH): Secondary | ICD-10-CM | POA: Diagnosis not present

## 2023-05-08 DIAGNOSIS — I4821 Permanent atrial fibrillation: Secondary | ICD-10-CM | POA: Insufficient documentation

## 2023-05-08 DIAGNOSIS — R5381 Other malaise: Secondary | ICD-10-CM | POA: Insufficient documentation

## 2023-05-08 DIAGNOSIS — I5033 Acute on chronic diastolic (congestive) heart failure: Secondary | ICD-10-CM | POA: Diagnosis not present

## 2023-05-08 DIAGNOSIS — I350 Nonrheumatic aortic (valve) stenosis: Secondary | ICD-10-CM | POA: Insufficient documentation

## 2023-05-08 DIAGNOSIS — I5032 Chronic diastolic (congestive) heart failure: Secondary | ICD-10-CM | POA: Diagnosis not present

## 2023-05-08 DIAGNOSIS — I4819 Other persistent atrial fibrillation: Secondary | ICD-10-CM | POA: Insufficient documentation

## 2023-05-08 DIAGNOSIS — D696 Thrombocytopenia, unspecified: Secondary | ICD-10-CM

## 2023-05-08 DIAGNOSIS — K7460 Unspecified cirrhosis of liver: Secondary | ICD-10-CM | POA: Insufficient documentation

## 2023-05-08 DIAGNOSIS — Z79899 Other long term (current) drug therapy: Secondary | ICD-10-CM | POA: Diagnosis not present

## 2023-05-08 DIAGNOSIS — I959 Hypotension, unspecified: Secondary | ICD-10-CM | POA: Diagnosis not present

## 2023-05-08 DIAGNOSIS — G4733 Obstructive sleep apnea (adult) (pediatric): Secondary | ICD-10-CM | POA: Diagnosis not present

## 2023-05-08 DIAGNOSIS — E876 Hypokalemia: Secondary | ICD-10-CM | POA: Diagnosis not present

## 2023-05-08 DIAGNOSIS — I130 Hypertensive heart and chronic kidney disease with heart failure and stage 1 through stage 4 chronic kidney disease, or unspecified chronic kidney disease: Secondary | ICD-10-CM | POA: Insufficient documentation

## 2023-05-08 DIAGNOSIS — N189 Chronic kidney disease, unspecified: Secondary | ICD-10-CM | POA: Diagnosis not present

## 2023-05-08 DIAGNOSIS — E1122 Type 2 diabetes mellitus with diabetic chronic kidney disease: Secondary | ICD-10-CM | POA: Diagnosis not present

## 2023-05-08 DIAGNOSIS — I10 Essential (primary) hypertension: Secondary | ICD-10-CM

## 2023-05-08 LAB — CBC
HCT: 36.8 % (ref 36.0–46.0)
Hemoglobin: 12 g/dL (ref 12.0–15.0)
MCH: 32.9 pg (ref 26.0–34.0)
MCHC: 32.6 g/dL (ref 30.0–36.0)
MCV: 100.8 fL — ABNORMAL HIGH (ref 80.0–100.0)
Platelets: 46 10*3/uL — ABNORMAL LOW (ref 150–400)
RBC: 3.65 MIL/uL — ABNORMAL LOW (ref 3.87–5.11)
RDW: 16 % — ABNORMAL HIGH (ref 11.5–15.5)
WBC: 4.6 10*3/uL (ref 4.0–10.5)
nRBC: 0 % (ref 0.0–0.2)

## 2023-05-08 LAB — BASIC METABOLIC PANEL
Anion gap: 11 (ref 5–15)
BUN: 19 mg/dL (ref 8–23)
CO2: 27 mmol/L (ref 22–32)
Calcium: 8.5 mg/dL — ABNORMAL LOW (ref 8.9–10.3)
Chloride: 101 mmol/L (ref 98–111)
Creatinine, Ser: 1.5 mg/dL — ABNORMAL HIGH (ref 0.44–1.00)
GFR, Estimated: 38 mL/min — ABNORMAL LOW (ref >60)
Glucose, Bld: 137 mg/dL — ABNORMAL HIGH (ref 70–99)
Potassium: 3.5 mmol/L (ref 3.5–5.1)
Sodium: 139 mmol/L (ref 135–145)

## 2023-05-08 LAB — BRAIN NATRIURETIC PEPTIDE: B Natriuretic Peptide: 287.7 pg/mL — ABNORMAL HIGH (ref 0.0–100.0)

## 2023-05-08 LAB — DIGOXIN LEVEL: Digoxin Level: 0.7 ng/mL — ABNORMAL LOW (ref 0.8–2.0)

## 2023-05-08 LAB — MAGNESIUM: Magnesium: 1.8 mg/dL (ref 1.7–2.4)

## 2023-05-08 MED ORDER — TORSEMIDE 20 MG PO TABS
ORAL_TABLET | ORAL | 3 refills | Status: DC
Start: 2023-05-08 — End: 2023-06-01

## 2023-05-08 MED ORDER — POTASSIUM CHLORIDE ER 10 MEQ PO CPCR
80.0000 meq | ORAL_CAPSULE | Freq: Two times a day (BID) | ORAL | 5 refills | Status: DC
Start: 2023-05-08 — End: 2023-06-01

## 2023-05-08 MED ORDER — METOPROLOL TARTRATE 25 MG PO TABS: 12.5000 mg | ORAL_TABLET | Freq: Two times a day (BID) | ORAL | 3 refills | Status: DC

## 2023-05-08 NOTE — Progress Notes (Signed)
ReDS Vest / Clip - 05/08/23 1500       ReDS Vest / Clip   Station Marker D    Ruler Value 40    ReDS Value Range Low volume    ReDS Actual Value 31

## 2023-05-08 NOTE — Patient Instructions (Addendum)
RedsClip done today.   Labs done today. We will contact you only if your labs are abnormal.  INCREASE Torsemide to 80mg  (4 tablets) by mouth every morning and 40mg (2 tablets) by mouth every evening.   INCREASE Potassium to 80mg  (8 tablets) by mouth 2 times daily.   DECREASE Metoprolol to 12.5mg  (1/2 tablet) by mouth 2 times daily.   No other medication changes were made. Please continue all current medications as prescribed.  Your physician recommends that you schedule a follow-up appointment in: 2 weeks post cath  If you have any questions or concerns before your next appointment please send [place] a message through [place] or call our office at [phone].    TO LEAVE A MESSAGE FOR THE NURSE SELECT OPTION 2, PLEASE LEAVE A MESSAGE INCLUDING: YOUR NAME DATE OF BIRTH CALL BACK NUMBER REASON FOR CALL**this is important as we prioritize the call backs  YOU WILL RECEIVE A CALL BACK THE SAME DAY AS LONG AS YOU CALL BEFORE 4:00 PM   Do the following things EVERYDAY: Weigh yourself in the morning before breakfast. Write it down and keep it in a log. Take your medicines as prescribed Eat low salt foods--Limit salt (sodium) to 2000 mg per day.  Stay as active as you can everyday Limit all fluids for the day to less than 2 liters   At the [facility], you and your health needs are our priority. As part of our continuing mission to provide you with exceptional heart care, we have created designated Provider Care Teams. These Care Teams include your primary Cardiologist (physician) and Advanced Practice Providers (APPs- Physician Assistants and Nurse Practitioners) who all work together to provide you with the care you need, when you need it.   You may see any of the following providers on your designated Care Team at your next follow up: Dr Arvilla Meres Dr Marca Ancona Dr. Marcos Eke, NP Robbie Lis, [place] [facility] [place],  [place] Brynda Peon, NP Karle Plumber, PharmD   Please be sure to bring in all your medications bottles to every appointment.    Thank you for choosing [facility]      You are scheduled for a Cardiac Catheterization on Friday, August 30 with Dr.  Dorthula Nettles .  1. Please arrive at the [facility] (Main Entrance A) at [facility]: [address] [place], [place] [address] at 9:00 AM (This time is 3 hour(s) before your procedure to ensure your preparation). Free valet parking service is available. You will check in at ADMITTING. The support person will be asked to wait in the waiting room.  It is OK to have someone drop you off and come back when you are ready to be discharged.    Special note: Every effort is made to have your procedure done on time. Please understand that emergencies sometimes delay scheduled procedures.  2. Diet: Do not eat solid foods after midnight.  The patient may have clear liquids until 5am upon the day of the procedure.  3.  Medication instructions in preparation for your procedure:  DO NOT TAKE TORSEMIDE AND METOLAZONE THE DAY OF YOUR PROCEDURE.  On the morning of your procedure, take any morning medicines NOT listed above.  You may use sips of water.  4. Plan to go home the same day, you will only stay overnight if medically necessary. 5. Bring a current list of your medications and current insurance cards. 6.  You MUST have a responsible person to drive you home. 7. Someone MUST be with you the first 24 hours after you arrive home or your discharge will be delayed. 8. Please wear clothes that are easy to get on and off and wear slip-on shoes.  Thank you for allowing [place] to care for you!   -- [facility]

## 2023-05-09 ENCOUNTER — Encounter (HOSPITAL_COMMUNITY): Payer: Self-pay

## 2023-05-09 ENCOUNTER — Ambulatory Visit: Payer: Medicare PPO | Admitting: Rehabilitation

## 2023-05-09 ENCOUNTER — Ambulatory Visit: Payer: Medicare PPO | Admitting: Physical Therapy

## 2023-05-09 ENCOUNTER — Encounter: Payer: Self-pay | Admitting: Physical Therapy

## 2023-05-09 DIAGNOSIS — I959 Hypotension, unspecified: Secondary | ICD-10-CM | POA: Diagnosis not present

## 2023-05-09 DIAGNOSIS — R2681 Unsteadiness on feet: Secondary | ICD-10-CM

## 2023-05-09 DIAGNOSIS — K7581 Nonalcoholic steatohepatitis (NASH): Secondary | ICD-10-CM | POA: Diagnosis not present

## 2023-05-09 DIAGNOSIS — I4821 Permanent atrial fibrillation: Secondary | ICD-10-CM | POA: Diagnosis not present

## 2023-05-09 DIAGNOSIS — I350 Nonrheumatic aortic (valve) stenosis: Secondary | ICD-10-CM | POA: Diagnosis not present

## 2023-05-09 DIAGNOSIS — R2689 Other abnormalities of gait and mobility: Secondary | ICD-10-CM

## 2023-05-09 DIAGNOSIS — K7460 Unspecified cirrhosis of liver: Secondary | ICD-10-CM | POA: Diagnosis not present

## 2023-05-09 DIAGNOSIS — M6281 Muscle weakness (generalized): Secondary | ICD-10-CM | POA: Diagnosis not present

## 2023-05-09 DIAGNOSIS — I5032 Chronic diastolic (congestive) heart failure: Secondary | ICD-10-CM | POA: Diagnosis not present

## 2023-05-09 NOTE — Therapy (Signed)
OUTPATIENT PHYSICAL THERAPY NEURO TREATMENT   Patient Name: Hannah Khan MRN: [id number] DOB:January 13, 1956, 67 y.o., female Today's Date: 05/09/2023   PCP: Darrow Bussing, MD REFERRING PROVIDER: Alver Sorrow, NP   END OF SESSION:  PT End of Session - 05/09/23 1449     Visit Number 4    Number of Visits 17    Date for PT Re-Evaluation 06/23/23    Authorization Type [employer]    Authorization Time Period 04/27/2023-06/23/2023    Authorization - Visit Number 4    Authorization - Number of Visits 17    PT Start Time 1454   needs to start in restroom   PT Stop Time 1530    PT Time Calculation (min) 36 min    Activity Tolerance Patient tolerated treatment well    Behavior During Therapy [facility] for tasks assessed/performed               Past Medical History:  Diagnosis Date   Anemia    hx of   Aortic stenosis 02/2023   Echo 03/07/23 LVEF 55-60%, mild to moderate AS   Arthritis    knees   CHF (congestive heart failure) (HCC)    chronic diastolic   Diabetes mellitus without complication (HCC)    type 2   Dyspnea    Dysrhythmia    Fibromyalgia    Gastric varices    large   H/O transfusion of platelets    Heart murmur    never has caused any problems   Hx of colonic polyps    s/p partial colectomy   Hyperlipidemia    Hypertension    Joint pain    in knees and back spasms   Left leg swelling    wear compression hose   Liver cirrhosis secondary to NASH (nonalcoholic steatohepatitis) (HCC) dx nov 2014   had enlarged spleen also    Morbid obesity (HCC)    Persistent atrial fibrillation (HCC)    Pneumonia 10/2016   x 5   PONV (postoperative nausea and vomiting)    in past none recent   Portal hypertension (HCC)    Sleep apnea    uses cpap, setting varies between 14-20   Spleen enlarged    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 [facility])    Past Surgical History:  Procedure Laterality Date   BREAST LUMPECTOMY WITH RADIOACTIVE SEED LOCALIZATION Right 08/29/2017    Procedure: RIGHT BREAST LUMPECTOMY WITH RADIOACTIVE SEEDS LOCALIZATION;  Surgeon: Harriette Bouillon, MD;  Location: [facility] OR;  Service: General;  Laterality: Right;   CESAREAN SECTION  1989   CHOLECYSTECTOMY  20 yrs ago   COLECTOMY  2011   COLONOSCOPY     COLONOSCOPY N/A 09/05/2018   Procedure: COLONOSCOPY;  Surgeon: Willis Modena, MD;  Location: [facility];  Service: Endoscopy;  Laterality: N/A;   COLONOSCOPY WITH PROPOFOL Bilateral 05/04/2022   Procedure: COLONOSCOPY WITH PROPOFOL;  Surgeon: Willis Modena, MD;  Location: [facility];  Service: Gastroenterology;  Laterality: Bilateral;   DILATATION & CURETTAGE/HYSTEROSCOPY WITH MYOSURE N/A 05/06/2016   Procedure: DILATATION & CURETTAGE/HYSTEROSCOPY WITH MYOSURE WITH POLYPECTOMY;  Surgeon: Myna Hidalgo, DO;  Location: [facility] ORS;  Service: Gynecology;  Laterality: N/A;   ESOPHAGOGASTRODUODENOSCOPY (EGD) WITH PROPOFOL N/A 09/04/2013   Procedure: ESOPHAGOGASTRODUODENOSCOPY (EGD) WITH PROPOFOL;  Surgeon: Willis Modena, MD;  Location: [facility];  Service: Endoscopy;  Laterality: N/A;   ESOPHAGOGASTRODUODENOSCOPY (EGD) WITH PROPOFOL Bilateral 05/04/2022   Procedure: ESOPHAGOGASTRODUODENOSCOPY (EGD) WITH PROPOFOL;  Surgeon: Willis Modena, MD;  Location: [facility];  Service: Gastroenterology;  Laterality: Bilateral;   IR INTRAVASCULAR ULTRASOUND NON CORONARY  12/23/2022   IR RADIOLOGIST EVAL & MGMT  09/21/2022   IR RADIOLOGIST EVAL & MGMT  10/06/2022   IR RADIOLOGIST EVAL & MGMT  11/02/2022   IR RADIOLOGIST EVAL & MGMT  01/31/2023   IR TIPS  12/23/2022   IR US GUIDE VASC ACCESS RIGHT  12/23/2022   KNEE ARTHROSCOPY  yrs ago   bilateral, one done x 1, one done twice   POLYPECTOMY  09/05/2018   Procedure: POLYPECTOMY;  Surgeon: Willis Modena, MD;  Location: [facility];  Service: Endoscopy;;   RADIOLOGY WITH ANESTHESIA N/A 12/23/2022   Procedure: TIPS;  Surgeon: Bennie Dallas, MD;  Location: [facility] OR;  Service: Radiology;  Laterality: N/A;   Patient  Active Problem List   Diagnosis Date Noted   Intractable nausea and vomiting 04/07/2023   Intractable vomiting with nausea 04/06/2023   Acute diastolic congestive heart failure (HCC) 03/06/2023   Idiopathic hypotension 03/06/2023   Sepsis due to cellulitis (HCC) 03/05/2023   Severe sepsis (HCC) 03/05/2023   Portal hypertension (HCC) 12/23/2022   Acute encephalopathy 01/10/2021   Secondary hypercoagulable state (HCC) 06/23/2020   Streptococcal bacteremia 03/16/2020   Chronic venous stasis dermatitis of both lower extremities 03/16/2020   Atrial fibrillation with RVR (HCC) 04/26/2018   Acute lower UTI 04/26/2018   Liver cirrhosis secondary to NASH (nonalcoholic steatohepatitis) (HCC) 04/26/2018   Hyperlipidemia 04/26/2018   Type 2 diabetes mellitus with other specified complication (HCC)    Cellulitis of right lower extremity 03/15/2018   Sepsis (HCC) 03/14/2018   Community acquired pneumonia of right lower lobe of lung 10/31/2016   Muscular abdominal pain in right upper quadrant 07/11/2016   Chronic diastolic heart failure (HCC) 07/10/2016   Thrombocytopenia (HCC) 07/10/2016   Permanent atrial fibrillation (HCC) 01/30/2016   Type 2 diabetes mellitus without complication, without long-term current use of insulin (HCC) 01/30/2016   Morbid obesity- 12/10/2015   Cellulitis, abdominal wall    Melena 01/31/2015   Aortic heart murmur 01/31/2015   Pancytopenia (HCC) 07/23/2013   Obstructive sleep apnea 08/07/2007   Essential hypertension 08/07/2007   ALLERGIC RHINITIS 08/07/2007    ONSET DATE: 04/18/2023 (MD referral)  REFERRING DIAG:  I50.32 (ICD-10-CM) - Chronic diastolic heart failure (HCC)  Z61.09 (ICD-10-CM) - Permanent atrial fibrillation (HCC)  K75.81,K74.60 (ICD-10-CM) - Liver cirrhosis secondary to NASH (nonalcoholic steatohepatitis) (HCC)  I35.0 (ICD-10-CM) - Aortic valve stenosis, etiology of cardiac valve disease unspecified  I95.9 (ICD-10-CM) - Hypotension, unspecified  hypotension type  R53.81 (ICD-10-CM) - Physical deconditioning  E87.6 (ICD-10-CM) - Hypokalemia  I10 (ICD-10-CM) - Essential hypertension    THERAPY DIAG:  Muscle weakness (generalized)  Unsteadiness on feet  Other abnormalities of gait and mobility  Rationale for Evaluation and Treatment: Rehabilitation  SUBJECTIVE:  SUBJECTIVE STATEMENT: Had to cancel pool today due to blisters opening up on my legs-weeping clear fluid.  Changed some of my medication to try to get some of the fluid off.  Having a heart cath on 8/30.  Pt accompanied by: self  PERTINENT HISTORY: hx of chronic diastolic heart failure, morbid obesity, OSA, NASH/cirrhosis, chronic thrombocytopenia, DM2, aortic stenosis, vasovagal presyncope, chronic atrial fibrillation not on anticoagulation.   PAIN:  Are you having pain? Yes: NPRS scale: 7/10 Pain location: knee, back and shoulders Pain description: achy, pain all the time Aggravating factors: unsure, hurts all the time Relieving factors: unsure  PRECAUTIONS: Fall and Other: morbid obesity, hx of low BP, cardiac issues ; 3# lifting restriction; a-fib  RED FLAGS: None   WEIGHT BEARING RESTRICTIONS: No  FALLS: Has patient fallen in last 6 months? Yes. Number of falls 2  LIVING ENVIRONMENT: Lives with: lives with their spouse Lives in: House/apartment Stairs:  1 step into the home from garage Has following equipment at home: Dan Humphreys - 4 wheeled  PLOF: Independent with household mobility with device and Independent with community mobility with device  PATIENT GOALS: Want to be able to get around better and do things in my house.  OBJECTIVE:     TODAY'S TREATMENT: 05/09/2023 Activity Comments  Vitals 97 O2 sats; 71-93 HR 110/54   Bilateral lower extremity seated  ex: LAQ 3 x 10 reps Marching 3 x 10 Hip adduction 3 x 10 No weight today due to fluid weeping, brief break between sets  Seated hip abduction 2 x 10   Forward lean to upright posture 2 x 10 Good form  Scapular squeezes 2 x 10 Good form  Seated knee flexion heel slides 2 x 10        PATIENT EDUCATION: Education details: Continue current HEP Person educated: Patient Education method: Explanation, Demonstration, and Handouts Education comprehension: verbalized understanding, returned demonstration, and needs further education   HOME EXERCISE PROGRAM: Access Code: [email or web address] URL: [email or web address] Date: 05/03/2023 Prepared by: [facility] Outpatient  Rehab - [facility]  Exercises - Seated Pelvic Tilts  - 1 x daily - 7 x weekly - 3 sets - 5 reps - Seated Shoulder Rolls  - 1 x daily - 7 x weekly - 3 sets - 5 reps - Seated Long Arc Quad  - 1 x daily - 7 x weekly - 2-3 sets - 10 reps - Seated March  - 1 x daily - 7 x weekly - 2-3 sets - 10 reps  --------------------------------------------------------------------------------- Objective measures below taken at initial evaluation:  DIAGNOSTIC FINDINGS: NA  COGNITION: Overall cognitive status: Within functional limits for tasks assessed   SENSATION: Light touch: WFL  EDEMA:  BLE edema + 1 pitting (has compression stockings, but does not use often)  POSTURE: rounded shoulders and forward head  LOWER EXTREMITY ROM:   A/ROM WFL in sitting (some limitations due to body habitus)   LOWER EXTREMITY MMT:  Grossly tested 4/5 except knee extension L 3+/5   VITALS: BP 104/56 HR 69 O2 sats:  93>97%  TRANSFERS: Assistive device utilized: None  Sit to stand: SBA and uses BUE support Stand to sit: SBA and BUE support  GAIT: Gait pattern: step through pattern, antalgic, and wide BOS Distance walked: 50 ft Assistive device utilized: Walker - 4 wheeled Level of assistance: SBA Comments: Fatigues and has to  stop gait to sit after 85 ft  FUNCTIONAL TESTS:  2 minute walk test: 85 ft; sits at 1 minute,  in addition to 1 standing rest break 10 meter walk test: 21.03 sec = 1.56 ft/sec 5x sit to stand:  with UE support, 24.82 sec  TODAY'S TREATMENT:                                                                                                                              DATE: 04/27/2023    PATIENT EDUCATION: Education details: PT eval results, POC Person educated: Patient Education method: Explanation Education comprehension: verbalized understanding  HOME EXERCISE PROGRAM: Not yet initiated  GOALS: Goals reviewed with patient? Yes  SHORT TERM GOALS: Target date: 05/26/2023  Pt will be independent with HEP for improved strength, balance, gait, decreased pain. Baseline: Goal status: IN PROGRESS  2.  Pt will improve 5x sit<>stand to less than or equal to 20 sec to demonstrate improved functional strength and transfer efficiency. Baseline: 24.82 sec Goal status: IN PROGRESS  3. Pt will ambulate at least 115 in 2 MWT for improved gait efficiency in home. Baseline: 85 ft. With one sitting break after 1 minute Goal status: IN PROGRESS  LONG TERM GOALS: Target date: 06/23/2023  Pt will be independent with progression of HEP for improved strength, balance, gait. Baseline:  Goal status: IN PROGRESS  2.  Pt will improve 5x sit<>stand to less than or equal to 15 sec to demonstrate improved functional strength and transfer efficiency. Baseline: 24.82 sec Goal status: IN PROGRESS  3.  Pt will improve gait velocity to at least 2 ft/sec for improved gait efficiency and safety. Baseline: 1.56 ft/sec Goal status: IN PROGRESS  4.  Pt will ambulate at least 150 ft in 2 MWT for improved gait efficiency in home environment. Baseline:  Goal status: IN PROGRESS  5.  Pt will verbalize ongoing plans for continued community fitness upon d/c from PT to maximize gains made in PT.  Baseline:  Goal  status:  IN PROGRESS   ASSESSMENT:  CLINICAL IMPRESSION: Skilled PT session today focused on continuing lower extremity strengthening exercises.  She cancelled aquatic therapy session today, due to weeping of BLE edema.  She had seen cardiologist clinic yesterday and they are aware of her edema, with plans for additional testing.  Pt performs seated exercises with brief seated rest breaks; vitals and O2 sats remain WFL throughout session.  She will continue to benefit from skilled PT to further address strength, balance, improved overall functional mobility and independence.  OBJECTIVE IMPAIRMENTS: Abnormal gait, cardiopulmonary status limiting activity, decreased activity tolerance, decreased balance, decreased endurance, decreased mobility, difficulty walking, decreased strength, postural dysfunction, and pain.   ACTIVITY LIMITATIONS: carrying, lifting, bending, standing, transfers, reach over head, hygiene/grooming, and locomotion level  PARTICIPATION LIMITATIONS: meal prep, cleaning, laundry, and community activity  PERSONAL FACTORS: 3+ comorbidities: significant medical history (see above)  are also affecting patient's functional outcome.   REHAB POTENTIAL: Good  CLINICAL DECISION MAKING: Evolving/moderate complexity  EVALUATION COMPLEXITY: Moderate  PLAN:  PT FREQUENCY: 2x/week  PT DURATION:  8 weeks plus eval (*Would like to work towards 1x/wk in pool, 1x/wk in clinic)  PLANNED INTERVENTIONS: Therapeutic exercises, Therapeutic activity, Neuromuscular re-education, Balance training, Gait training, Patient/Family education, Self Care, Aquatic Therapy, and Manual therapy  PLAN FOR NEXT SESSION: Continue to progress HEP for strengthening, posture, gait, standing activities.  (May have to stay in clinic 2x/wk until her BLE edema/weeping is under control)  *AQUATICS: Frequency: 1x/wk  Duration: 4 weeks Special Instruction: Aquatic therapy to assist with pt's tolerance for  strengthening, balance, gait activities in aquatic setting, due to pt's extensive history of joint pain, to aid with pt's ability to progress towards PT goals.   Gean Maidens., PT 05/09/2023, 4:20 PM  [facility] at [facility] [address] [place], [address] [place], [place] [address] Phone # [phone] Fax # [phone]

## 2023-05-12 ENCOUNTER — Ambulatory Visit: Payer: Medicare PPO | Admitting: Physical Therapy

## 2023-05-12 DIAGNOSIS — M6281 Muscle weakness (generalized): Secondary | ICD-10-CM | POA: Diagnosis not present

## 2023-05-12 DIAGNOSIS — I959 Hypotension, unspecified: Secondary | ICD-10-CM | POA: Diagnosis not present

## 2023-05-12 DIAGNOSIS — R2681 Unsteadiness on feet: Secondary | ICD-10-CM

## 2023-05-12 DIAGNOSIS — K7460 Unspecified cirrhosis of liver: Secondary | ICD-10-CM | POA: Diagnosis not present

## 2023-05-12 DIAGNOSIS — K7581 Nonalcoholic steatohepatitis (NASH): Secondary | ICD-10-CM | POA: Diagnosis not present

## 2023-05-12 DIAGNOSIS — I4821 Permanent atrial fibrillation: Secondary | ICD-10-CM | POA: Diagnosis not present

## 2023-05-12 DIAGNOSIS — I350 Nonrheumatic aortic (valve) stenosis: Secondary | ICD-10-CM | POA: Diagnosis not present

## 2023-05-12 DIAGNOSIS — I5032 Chronic diastolic (congestive) heart failure: Secondary | ICD-10-CM | POA: Diagnosis not present

## 2023-05-12 DIAGNOSIS — R2689 Other abnormalities of gait and mobility: Secondary | ICD-10-CM | POA: Diagnosis not present

## 2023-05-12 NOTE — Therapy (Signed)
OUTPATIENT PHYSICAL THERAPY NEURO TREATMENT   Patient Name: Hannah Khan MRN: [id number] DOB:[birth year], 67 y.o., female Today's Date: 05/12/2023   PCP: Darrow Bussing, MD REFERRING PROVIDER: Alver Sorrow, NP   END OF SESSION:  PT End of Session - 05/12/23 1021     Visit Number 5    Number of Visits 17    Date for PT Re-Evaluation 06/23/23    Authorization Type [employer]    Authorization Time Period 04/27/2023-06/23/2023    Authorization - Number of Visits 17    PT Start Time 1021    PT Stop Time 1101    PT Time Calculation (min) 40 min    Activity Tolerance Patient tolerated treatment well    Behavior During Therapy WFL for tasks assessed/performed                Past Medical History:  Diagnosis Date   Anemia    hx of   Aortic stenosis 02/2023   Echo 03/07/23 LVEF 55-60%, mild to moderate AS   Arthritis    knees   CHF (congestive heart failure) (HCC)    chronic diastolic   Diabetes mellitus without complication (HCC)    type 2   Dyspnea    Dysrhythmia    Fibromyalgia    Gastric varices    large   H/O transfusion of platelets    Heart murmur    never has caused any problems   Hx of colonic polyps    s/p partial colectomy   Hyperlipidemia    Hypertension    Joint pain    in knees and back spasms   Left leg swelling    wear compression hose   Liver cirrhosis secondary to NASH (nonalcoholic steatohepatitis) (HCC) dx nov 2014   had enlarged spleen also    Morbid obesity (HCC)    Persistent atrial fibrillation (HCC)    Pneumonia 10/2016   x 5   PONV (postoperative nausea and vomiting)    in past none recent   Portal hypertension (HCC)    Sleep apnea    uses cpap, setting varies between 14-20   Spleen enlarged    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 [facility])    Past Surgical History:  Procedure Laterality Date   BREAST LUMPECTOMY WITH RADIOACTIVE SEED LOCALIZATION Right 08/29/2017   Procedure: RIGHT BREAST LUMPECTOMY WITH RADIOACTIVE SEEDS  LOCALIZATION;  Surgeon: Harriette Bouillon, MD;  Location: [facility] OR;  Service: General;  Laterality: Right;   CESAREAN SECTION  1989   CHOLECYSTECTOMY  20 yrs ago   COLECTOMY  2011   COLONOSCOPY     COLONOSCOPY N/A 09/05/2018   Procedure: COLONOSCOPY;  Surgeon: Willis Modena, MD;  Location: [facility];  Service: Endoscopy;  Laterality: N/A;   COLONOSCOPY WITH PROPOFOL Bilateral 05/04/2022   Procedure: COLONOSCOPY WITH PROPOFOL;  Surgeon: Willis Modena, MD;  Location: [facility];  Service: Gastroenterology;  Laterality: Bilateral;   DILATATION & CURETTAGE/HYSTEROSCOPY WITH MYOSURE N/A 05/06/2016   Procedure: DILATATION & CURETTAGE/HYSTEROSCOPY WITH MYOSURE WITH POLYPECTOMY;  Surgeon: Myna Hidalgo, DO;  Location: [facility];  Service: Gynecology;  Laterality: N/A;   ESOPHAGOGASTRODUODENOSCOPY (EGD) WITH PROPOFOL N/A 09/04/2013   Procedure: ESOPHAGOGASTRODUODENOSCOPY (EGD) WITH PROPOFOL;  Surgeon: Willis Modena, MD;  Location: [facility];  Service: Endoscopy;  Laterality: N/A;   ESOPHAGOGASTRODUODENOSCOPY (EGD) WITH PROPOFOL Bilateral 05/04/2022   Procedure: ESOPHAGOGASTRODUODENOSCOPY (EGD) WITH PROPOFOL;  Surgeon: Willis Modena, MD;  Location: [facility];  Service: Gastroenterology;  Laterality: Bilateral;   IR INTRAVASCULAR ULTRASOUND NON CORONARY  12/23/2022  IR RADIOLOGIST EVAL & MGMT  09/21/2022   IR RADIOLOGIST EVAL & MGMT  10/06/2022   IR RADIOLOGIST EVAL & MGMT  11/02/2022   IR RADIOLOGIST EVAL & MGMT  01/31/2023   IR TIPS  12/23/2022   IR US GUIDE VASC ACCESS RIGHT  12/23/2022   KNEE ARTHROSCOPY  yrs ago   bilateral, one done x 1, one done twice   POLYPECTOMY  09/05/2018   Procedure: POLYPECTOMY;  Surgeon: Willis Modena, MD;  Location: [facility];  Service: Endoscopy;;   RADIOLOGY WITH ANESTHESIA N/A 12/23/2022   Procedure: TIPS;  Surgeon: Bennie Dallas, MD;  Location: [facility];  Service: Radiology;  Laterality: N/A;   Patient Active Problem List   Diagnosis Date Noted   Intractable  nausea and vomiting 04/07/2023   Intractable vomiting with nausea 04/06/2023   Acute diastolic congestive heart failure (HCC) 03/06/2023   Idiopathic hypotension 03/06/2023   Sepsis due to cellulitis (HCC) 03/05/2023   Severe sepsis (HCC) 03/05/2023   Portal hypertension (HCC) 12/23/2022   Acute encephalopathy 01/10/2021   Secondary hypercoagulable state (HCC) 06/23/2020   Streptococcal bacteremia 03/16/2020   Chronic venous stasis dermatitis of both lower extremities 03/16/2020   Atrial fibrillation with RVR (HCC) 04/26/2018   Acute lower UTI 04/26/2018   Liver cirrhosis secondary to NASH (nonalcoholic steatohepatitis) (HCC) 04/26/2018   Hyperlipidemia 04/26/2018   Type 2 diabetes mellitus with other specified complication (HCC)    Cellulitis of right lower extremity 03/15/2018   Sepsis (HCC) 03/14/2018   Community acquired pneumonia of right lower lobe of lung 10/31/2016   Muscular abdominal pain in right upper quadrant 07/11/2016   Chronic diastolic heart failure (HCC) 07/10/2016   Thrombocytopenia (HCC) 07/10/2016   Permanent atrial fibrillation (HCC) 01/30/2016   Type 2 diabetes mellitus without complication, without long-term current use of insulin (HCC) 01/30/2016   Morbid obesity- 12/10/2015   Cellulitis, abdominal wall    Melena 01/31/2015   Aortic heart murmur 01/31/2015   Pancytopenia (HCC) 07/23/2013   Obstructive sleep apnea 08/07/2007   Essential hypertension 08/07/2007   ALLERGIC RHINITIS 08/07/2007    ONSET DATE: 04/18/2023 (MD referral)  REFERRING DIAG:  I50.32 (ICD-10-CM) - Chronic diastolic heart failure (HCC)  M57.84 (ICD-10-CM) - Permanent atrial fibrillation (HCC)  K75.81,K74.60 (ICD-10-CM) - Liver cirrhosis secondary to NASH (nonalcoholic steatohepatitis) (HCC)  I35.0 (ICD-10-CM) - Aortic valve stenosis, etiology of cardiac valve disease unspecified  I95.9 (ICD-10-CM) - Hypotension, unspecified hypotension type  R53.81 (ICD-10-CM) - Physical  deconditioning  E87.6 (ICD-10-CM) - Hypokalemia  I10 (ICD-10-CM) - Essential hypertension    THERAPY DIAG:  Muscle weakness (generalized)  Unsteadiness on feet  Other abnormalities of gait and mobility  Rationale for Evaluation and Treatment: Rehabilitation  SUBJECTIVE:  SUBJECTIVE STATEMENT: Still not sleeping well at home.  Woke up at 3 am.  Back and shoulder blades seem to be a little better.    Pt accompanied by: self  PERTINENT HISTORY: hx of chronic diastolic heart failure, morbid obesity, OSA, NASH/cirrhosis, chronic thrombocytopenia, DM2, aortic stenosis, vasovagal presyncope, chronic atrial fibrillation not on anticoagulation.   PAIN:  Are you having pain? Yes: NPRS scale: 5/10 Pain location: knee Pain description: achy, pain all the time Aggravating factors: unsure, hurts all the time Relieving factors: unsure  PRECAUTIONS: Fall and Other: morbid obesity, hx of low BP, cardiac issues ; 3# lifting restriction; a-fib  RED FLAGS: None   WEIGHT BEARING RESTRICTIONS: No  FALLS: Has patient fallen in last 6 months? Yes. Number of falls 2  LIVING ENVIRONMENT: Lives with: lives with their spouse Lives in: House/apartment Stairs:  1 step into the home from garage Has following equipment at home: Dan Humphreys - 4 wheeled  PLOF: Independent with household mobility with device and Independent with community mobility with device  PATIENT GOALS: Want to be able to get around better and do things in my house.  OBJECTIVE:    TODAY'S TREATMENT: 05/12/2023 Activity Comments  Vitals 97% O2, HR 95 bpm, 103/54   Bilateral lower extremity seated ex: LAQ 3 x 10 reps Marching 3 x 10 Ankle pumps 3 x 10 1#  Seated hip abduction 3 x 10 Seated knee flexion heel slides 3 x 10 Seated glut sets 2 x  10  1#  Seated scapular retraction, red band, 2 x 10 Pt does c/o pain in L arm and stiffness at neck  Seated horizontal abduction 10 reps   Shoulder rolls       PATIENT EDUCATION: Education details: Continue current HEP Person educated: Patient Education method: Explanation, Demonstration, and Handouts Education comprehension: verbalized understanding, returned demonstration, and needs further education   HOME EXERCISE PROGRAM: Access Code: [email or web address] URL: [email or web address] Date: 05/03/2023 Prepared by: [facility] - Outpatient  Rehab - [facility]  Exercises - Seated Pelvic Tilts  - 1 x daily - 7 x weekly - 3 sets - 5 reps - Seated Shoulder Rolls  - 1 x daily - 7 x weekly - 3 sets - 5 reps - Seated Long Arc Quad  - 1 x daily - 7 x weekly - 2-3 sets - 10 reps - Seated March  - 1 x daily - 7 x weekly - 2-3 sets - 10 reps  --------------------------------------------------------------------------------- Objective measures below taken at initial evaluation:  DIAGNOSTIC FINDINGS: NA  COGNITION: Overall cognitive status: Within functional limits for tasks assessed   SENSATION: Light touch: WFL  EDEMA:  BLE edema + 1 pitting (has compression stockings, but does not use often)  POSTURE: rounded shoulders and forward head  LOWER EXTREMITY ROM:   A/ROM WFL in sitting (some limitations due to body habitus)   LOWER EXTREMITY MMT:  Grossly tested 4/5 except knee extension L 3+/5   VITALS: BP 104/56 HR 69 O2 sats:  93>97%  TRANSFERS: Assistive device utilized: None  Sit to stand: SBA and uses BUE support Stand to sit: SBA and BUE support  GAIT: Gait pattern: step through pattern, antalgic, and wide BOS Distance walked: 50 ft Assistive device utilized: Walker - 4 wheeled Level of assistance: SBA Comments: Fatigues and has to stop gait to sit after 85 ft  FUNCTIONAL TESTS:  2 minute walk test: 85 ft; sits at 1 minute, in addition to 1 standing  rest break 10 meter walk  test: 21.03 sec = 1.56 ft/sec 5x sit to stand:  with UE support, 24.82 sec  TODAY'S TREATMENT:                                                                                                                              DATE: 04/27/2023    PATIENT EDUCATION: Education details: PT eval results, POC Person educated: Patient Education method: Explanation Education comprehension: verbalized understanding  HOME EXERCISE PROGRAM: Not yet initiated  GOALS: Goals reviewed with patient? Yes  SHORT TERM GOALS: Target date: 05/26/2023  Pt will be independent with HEP for improved strength, balance, gait, decreased pain. Baseline: Goal status: IN PROGRESS  2.  Pt will improve 5x sit<>stand to less than or equal to 20 sec to demonstrate improved functional strength and transfer efficiency. Baseline: 24.82 sec Goal status: IN PROGRESS  3. Pt will ambulate at least 115 in 2 MWT for improved gait efficiency in home. Baseline: 85 ft. With one sitting break after 1 minute Goal status: IN PROGRESS  LONG TERM GOALS: Target date: 06/23/2023  Pt will be independent with progression of HEP for improved strength, balance, gait. Baseline:  Goal status: IN PROGRESS  2.  Pt will improve 5x sit<>stand to less than or equal to 15 sec to demonstrate improved functional strength and transfer efficiency. Baseline: 24.82 sec Goal status: IN PROGRESS  3.  Pt will improve gait velocity to at least 2 ft/sec for improved gait efficiency and safety. Baseline: 1.56 ft/sec Goal status: IN PROGRESS  4.  Pt will ambulate at least 150 ft in 2 MWT for improved gait efficiency in home environment. Baseline:  Goal status: IN PROGRESS  5.  Pt will verbalize ongoing plans for continued community fitness upon d/c from PT to maximize gains made in PT.  Baseline:  Goal status:  IN PROGRESS   ASSESSMENT:  CLINICAL IMPRESSION: Skilled PT session today continued to focus on lower extremity  strengthening and postural exercises.  She was able to tolerate addition of 1# weights again (as legs were not quite as swollen or weeping today) for 3 sets of 10 reps most BLE exercises.  Worked to try to progress upper body/postural strengthening with theraband; however, pt c/o pain in neck and in L shoulder, so did not yet progress/add that for home.  She will continue to benefit from skilled PT towards goals for improved functional mobility and independence.  OBJECTIVE IMPAIRMENTS: Abnormal gait, cardiopulmonary status limiting activity, decreased activity tolerance, decreased balance, decreased endurance, decreased mobility, difficulty walking, decreased strength, postural dysfunction, and pain.   ACTIVITY LIMITATIONS: carrying, lifting, bending, standing, transfers, reach over head, hygiene/grooming, and locomotion level  PARTICIPATION LIMITATIONS: meal prep, cleaning, laundry, and community activity  PERSONAL FACTORS: 3+ comorbidities: significant medical history (see above)  are also affecting patient's functional outcome.   REHAB POTENTIAL: Good  CLINICAL DECISION MAKING: Evolving/moderate complexity  EVALUATION COMPLEXITY: Moderate  PLAN:  PT FREQUENCY: 2x/week  PT DURATION: 8  weeks plus eval (*Would like to work towards 1x/wk in pool, 1x/wk in clinic)  PLANNED INTERVENTIONS: Therapeutic exercises, Therapeutic activity, Neuromuscular re-education, Balance training, Gait training, Patient/Family education, Self Care, Aquatic Therapy, and Manual therapy  PLAN FOR NEXT SESSION: Try exercises for shoulder/postural strengthening (lat pull down).  Continue to progress HEP for strengthening, posture, gait, standing activities.  (May have to stay in clinic 2x/wk until her BLE edema/weeping is under control)  *AQUATICS: Frequency: 1x/wk  Duration: 4 weeks Special Instruction: Aquatic therapy to assist with pt's tolerance for strengthening, balance, gait activities in aquatic setting,  due to pt's extensive history of joint pain, to aid with pt's ability to progress towards PT goals.   Gean Maidens., PT 05/12/2023, 11:04 AM  [facility] at [facility] [address] [place], [address] [place], [place] [address] Phone # [phone] Fax # [phone]

## 2023-05-15 ENCOUNTER — Other Ambulatory Visit (HOSPITAL_COMMUNITY): Payer: Self-pay

## 2023-05-15 DIAGNOSIS — I272 Pulmonary hypertension, unspecified: Secondary | ICD-10-CM

## 2023-05-15 DIAGNOSIS — I5032 Chronic diastolic (congestive) heart failure: Secondary | ICD-10-CM

## 2023-05-15 NOTE — Progress Notes (Signed)
Orders placed for [facility] scheduled 8/30 with Dr. Gasper Lloyd

## 2023-05-16 ENCOUNTER — Ambulatory Visit: Payer: Medicare PPO | Admitting: Physical Therapy

## 2023-05-16 ENCOUNTER — Ambulatory Visit: Payer: Medicare PPO | Admitting: Rehabilitation

## 2023-05-16 ENCOUNTER — Encounter: Payer: Self-pay | Admitting: Physical Therapy

## 2023-05-16 ENCOUNTER — Telehealth (HOSPITAL_COMMUNITY): Payer: Self-pay | Admitting: *Deleted

## 2023-05-16 DIAGNOSIS — M6281 Muscle weakness (generalized): Secondary | ICD-10-CM | POA: Diagnosis not present

## 2023-05-16 DIAGNOSIS — K7460 Unspecified cirrhosis of liver: Secondary | ICD-10-CM | POA: Diagnosis not present

## 2023-05-16 DIAGNOSIS — I4821 Permanent atrial fibrillation: Secondary | ICD-10-CM | POA: Diagnosis not present

## 2023-05-16 DIAGNOSIS — R2689 Other abnormalities of gait and mobility: Secondary | ICD-10-CM

## 2023-05-16 DIAGNOSIS — I5032 Chronic diastolic (congestive) heart failure: Secondary | ICD-10-CM | POA: Diagnosis not present

## 2023-05-16 DIAGNOSIS — I350 Nonrheumatic aortic (valve) stenosis: Secondary | ICD-10-CM | POA: Diagnosis not present

## 2023-05-16 DIAGNOSIS — K7581 Nonalcoholic steatohepatitis (NASH): Secondary | ICD-10-CM | POA: Diagnosis not present

## 2023-05-16 DIAGNOSIS — R2681 Unsteadiness on feet: Secondary | ICD-10-CM

## 2023-05-16 DIAGNOSIS — I959 Hypotension, unspecified: Secondary | ICD-10-CM | POA: Diagnosis not present

## 2023-05-16 NOTE — Therapy (Signed)
OUTPATIENT PHYSICAL THERAPY NEURO TREATMENT   Patient Name: Hannah Khan MRN: [id number] DOB:10/31/1955, 67 y.o., female Today's Date: 05/16/2023   PCP: Darrow Bussing, MD REFERRING PROVIDER: Alver Sorrow, NP   END OF SESSION:  PT End of Session - 05/16/23 1401     Visit Number 6    Number of Visits 17    Date for PT Re-Evaluation 06/23/23    Authorization Type [employer]    Authorization Time Period 04/27/2023-06/23/2023    Authorization - Visit Number 5    Authorization - Number of Visits 17    PT Start Time 1403    PT Stop Time 1445    PT Time Calculation (min) 42 min    Activity Tolerance Patient tolerated treatment well    Behavior During Therapy WFL for tasks assessed/performed                Past Medical History:  Diagnosis Date   Anemia    hx of   Aortic stenosis 02/2023   Echo 03/07/23 LVEF 55-60%, mild to moderate AS   Arthritis    knees   CHF (congestive heart failure) (HCC)    chronic diastolic   Diabetes mellitus without complication (HCC)    type 2   Dyspnea    Dysrhythmia    Fibromyalgia    Gastric varices    large   H/O transfusion of platelets    Heart murmur    never has caused any problems   Hx of colonic polyps    s/p partial colectomy   Hyperlipidemia    Hypertension    Joint pain    in knees and back spasms   Left leg swelling    wear compression hose   Liver cirrhosis secondary to NASH (nonalcoholic steatohepatitis) (HCC) dx nov 2014   had enlarged spleen also    Morbid obesity (HCC)    Persistent atrial fibrillation (HCC)    Pneumonia 10/2016   x 5   PONV (postoperative nausea and vomiting)    in past none recent   Portal hypertension (HCC)    Sleep apnea    uses cpap, setting varies between 14-20   Spleen enlarged    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 [facility])    Past Surgical History:  Procedure Laterality Date   BREAST LUMPECTOMY WITH RADIOACTIVE SEED LOCALIZATION Right 08/29/2017   Procedure: RIGHT BREAST  LUMPECTOMY WITH RADIOACTIVE SEEDS LOCALIZATION;  Surgeon: Harriette Bouillon, MD;  Location: [facility] OR;  Service: General;  Laterality: Right;   CESAREAN SECTION  1989   CHOLECYSTECTOMY  20 yrs ago   COLECTOMY  2011   COLONOSCOPY     COLONOSCOPY N/A 09/05/2018   Procedure: COLONOSCOPY;  Surgeon: Willis Modena, MD;  Location: [facility];  Service: Endoscopy;  Laterality: N/A;   COLONOSCOPY WITH PROPOFOL Bilateral 05/04/2022   Procedure: COLONOSCOPY WITH PROPOFOL;  Surgeon: Willis Modena, MD;  Location: [facility];  Service: Gastroenterology;  Laterality: Bilateral;   DILATATION & CURETTAGE/HYSTEROSCOPY WITH MYOSURE N/A 05/06/2016   Procedure: DILATATION & CURETTAGE/HYSTEROSCOPY WITH MYOSURE WITH POLYPECTOMY;  Surgeon: Myna Hidalgo, DO;  Location: [facility];  Service: Gynecology;  Laterality: N/A;   ESOPHAGOGASTRODUODENOSCOPY (EGD) WITH PROPOFOL N/A 09/04/2013   Procedure: ESOPHAGOGASTRODUODENOSCOPY (EGD) WITH PROPOFOL;  Surgeon: Willis Modena, MD;  Location: [facility];  Service: Endoscopy;  Laterality: N/A;   ESOPHAGOGASTRODUODENOSCOPY (EGD) WITH PROPOFOL Bilateral 05/04/2022   Procedure: ESOPHAGOGASTRODUODENOSCOPY (EGD) WITH PROPOFOL;  Surgeon: Willis Modena, MD;  Location: [facility];  Service: Gastroenterology;  Laterality: Bilateral;  IR INTRAVASCULAR ULTRASOUND NON CORONARY  12/23/2022   IR RADIOLOGIST EVAL & MGMT  09/21/2022   IR RADIOLOGIST EVAL & MGMT  10/06/2022   IR RADIOLOGIST EVAL & MGMT  11/02/2022   IR RADIOLOGIST EVAL & MGMT  01/31/2023   IR TIPS  12/23/2022   IR US GUIDE VASC ACCESS RIGHT  12/23/2022   KNEE ARTHROSCOPY  yrs ago   bilateral, one done x 1, one done twice   POLYPECTOMY  09/05/2018   Procedure: POLYPECTOMY;  Surgeon: Willis Modena, MD;  Location: [facility];  Service: Endoscopy;;   RADIOLOGY WITH ANESTHESIA N/A 12/23/2022   Procedure: TIPS;  Surgeon: Bennie Dallas, MD;  Location: [facility];  Service: Radiology;  Laterality: N/A;   Patient Active Problem List    Diagnosis Date Noted   Intractable nausea and vomiting 04/07/2023   Intractable vomiting with nausea 04/06/2023   Acute diastolic congestive heart failure (HCC) 03/06/2023   Idiopathic hypotension 03/06/2023   Sepsis due to cellulitis (HCC) 03/05/2023   Severe sepsis (HCC) 03/05/2023   Portal hypertension (HCC) 12/23/2022   Acute encephalopathy 01/10/2021   Secondary hypercoagulable state (HCC) 06/23/2020   Streptococcal bacteremia 03/16/2020   Chronic venous stasis dermatitis of both lower extremities 03/16/2020   Atrial fibrillation with RVR (HCC) 04/26/2018   Acute lower UTI 04/26/2018   Liver cirrhosis secondary to NASH (nonalcoholic steatohepatitis) (HCC) 04/26/2018   Hyperlipidemia 04/26/2018   Type 2 diabetes mellitus with other specified complication (HCC)    Cellulitis of right lower extremity 03/15/2018   Sepsis (HCC) 03/14/2018   Community acquired pneumonia of right lower lobe of lung 10/31/2016   Muscular abdominal pain in right upper quadrant 07/11/2016   Chronic diastolic heart failure (HCC) 07/10/2016   Thrombocytopenia (HCC) 07/10/2016   Permanent atrial fibrillation (HCC) 01/30/2016   Type 2 diabetes mellitus without complication, without long-term current use of insulin (HCC) 01/30/2016   Morbid obesity- 12/10/2015   Cellulitis, abdominal wall    Melena 01/31/2015   Aortic heart murmur 01/31/2015   Pancytopenia (HCC) 07/23/2013   Obstructive sleep apnea 08/07/2007   Essential hypertension 08/07/2007   ALLERGIC RHINITIS 08/07/2007    ONSET DATE: 04/18/2023 (MD referral)  REFERRING DIAG:  I50.32 (ICD-10-CM) - Chronic diastolic heart failure (HCC)  Z61.09 (ICD-10-CM) - Permanent atrial fibrillation (HCC)  K75.81,K74.60 (ICD-10-CM) - Liver cirrhosis secondary to NASH (nonalcoholic steatohepatitis) (HCC)  I35.0 (ICD-10-CM) - Aortic valve stenosis, etiology of cardiac valve disease unspecified  I95.9 (ICD-10-CM) - Hypotension, unspecified hypotension type   R53.81 (ICD-10-CM) - Physical deconditioning  E87.6 (ICD-10-CM) - Hypokalemia  I10 (ICD-10-CM) - Essential hypertension    THERAPY DIAG:  Muscle weakness (generalized)  Unsteadiness on feet  Other abnormalities of gait and mobility  Rationale for Evaluation and Treatment: Rehabilitation  SUBJECTIVE:  SUBJECTIVE STATEMENT: Slept well last night, like 7 hours.   Wearing the compression stockings today-sometimes only wear them in the beginning of the day.  Pt accompanied by: self  PERTINENT HISTORY: hx of chronic diastolic heart failure, morbid obesity, OSA, NASH/cirrhosis, chronic thrombocytopenia, DM2, aortic stenosis, vasovagal presyncope, chronic atrial fibrillation not on anticoagulation. Fracture of R shoulder and partial tear/ scar tissue RTC  2019 (s/p bicepectomy)  PAIN:  Are you having pain? Yes: NPRS scale: 5/10 Pain location: knee Pain description: achy, pain all the time Aggravating factors: unsure, hurts all the time Relieving factors: unsure  PRECAUTIONS: Fall and Other: morbid obesity, hx of low BP, cardiac issues ; 3# lifting restriction; a-fib  RED FLAGS: None   WEIGHT BEARING RESTRICTIONS: No  FALLS: Has patient fallen in last 6 months? Yes. Number of falls 2  LIVING ENVIRONMENT: Lives with: lives with their spouse Lives in: House/apartment Stairs:  1 step into the home from garage Has following equipment at home: Dan Humphreys - 4 wheeled  PLOF: Independent with household mobility with device and Independent with community mobility with device  PATIENT GOALS: Want to be able to get around better and do things in my house.  OBJECTIVE:   TODAY'S TREATMENT: 05/16/2023 Activity Comments  Vitals 106/50, 97%, HR 85-90   Bilateral lower extremity seated ex: LAQ 3 x 10  reps Marching 3 x 10 Ankle pumps 3 x 10 2#  Seated hip abduction 3 x 10 Seated glut sets 3 x 10  2#  Seated hamstring curls, 5# at weightrack, 2 x 10 Good form, fatigued at end  BUE shoulder flexion 10 reps, 2#   Scapular retraction 10 reps   Attempted NuSTep, Level 2, 4 extremities x 1 min Did not continue due to knee pain  Sit to stand,  reps from mat surface, hands on knees support No c/o increased knee pain      PATIENT EDUCATION: Education details: Continue current HEP-look to adding weight (adjustable) for ankle weight progression for strengthening at home Person educated: Patient Education method: Explanation, Demonstration, and Handouts Education comprehension: verbalized understanding, returned demonstration, and needs further education   HOME EXERCISE PROGRAM: Access Code: [email or web address] URL: [email or web address] Date: 05/03/2023 Prepared by: [facility] - Outpatient  Rehab - [facility]  Exercises - Seated Pelvic Tilts  - 1 x daily - 7 x weekly - 3 sets - 5 reps - Seated Shoulder Rolls  - 1 x daily - 7 x weekly - 3 sets - 5 reps - Seated Long Arc Quad  - 1 x daily - 7 x weekly - 2-3 sets - 10 reps - Seated March  - 1 x daily - 7 x weekly - 2-3 sets - 10 reps (Pt is also performing seated stepping out/in, glut sets, scapular retraction 3 sets of 10) --------------------------------------------------------------------------------- Objective measures below taken at initial evaluation:  DIAGNOSTIC FINDINGS: NA  COGNITION: Overall cognitive status: Within functional limits for tasks assessed   SENSATION: Light touch: WFL  EDEMA:  BLE edema + 1 pitting (has compression stockings, but does not use often)  POSTURE: rounded shoulders and forward head  LOWER EXTREMITY ROM:   A/ROM WFL in sitting (some limitations due to body habitus)   LOWER EXTREMITY MMT:  Grossly tested 4/5 except knee extension L 3+/5   VITALS: BP 104/56 HR 69 O2 sats:   93>97%  TRANSFERS: Assistive device utilized: None  Sit to stand: SBA and uses BUE support Stand to sit: SBA and BUE support  GAIT: Gait  pattern: step through pattern, antalgic, and wide BOS Distance walked: 50 ft Assistive device utilized: Walker - 4 wheeled Level of assistance: SBA Comments: Fatigues and has to stop gait to sit after 85 ft  FUNCTIONAL TESTS:  2 minute walk test: 85 ft; sits at 1 minute, in addition to 1 standing rest break 10 meter walk test: 21.03 sec = 1.56 ft/sec 5x sit to stand:  with UE support, 24.82 sec  TODAY'S TREATMENT:                                                                                                                              DATE: 04/27/2023    PATIENT EDUCATION: Education details: PT eval results, POC Person educated: Patient Education method: Explanation Education comprehension: verbalized understanding  HOME EXERCISE PROGRAM: Not yet initiated  GOALS: Goals reviewed with patient? Yes  SHORT TERM GOALS: Target date: 05/26/2023  Pt will be independent with HEP for improved strength, balance, gait, decreased pain. Baseline: Goal status: IN PROGRESS  2.  Pt will improve 5x sit<>stand to less than or equal to 20 sec to demonstrate improved functional strength and transfer efficiency. Baseline: 24.82 sec Goal status: IN PROGRESS  3. Pt will ambulate at least 115 in 2 MWT for improved gait efficiency in home. Baseline: 85 ft. With one sitting break after 1 minute Goal status: IN PROGRESS  LONG TERM GOALS: Target date: 06/23/2023  Pt will be independent with progression of HEP for improved strength, balance, gait. Baseline:  Goal status: IN PROGRESS  2.  Pt will improve 5x sit<>stand to less than or equal to 15 sec to demonstrate improved functional strength and transfer efficiency. Baseline: 24.82 sec Goal status: IN PROGRESS  3.  Pt will improve gait velocity to at least 2 ft/sec for improved gait efficiency and  safety. Baseline: 1.56 ft/sec Goal status: IN PROGRESS  4.  Pt will ambulate at least 150 ft in 2 MWT for improved gait efficiency in home environment. Baseline:  Goal status: IN PROGRESS  5.  Pt will verbalize ongoing plans for continued community fitness upon d/c from PT to maximize gains made in PT.  Baseline:  Goal status:  IN PROGRESS   ASSESSMENT:  CLINICAL IMPRESSION: Pt able to progress seated BLE strengthening exercises to 2# weights, from 1# this visit, for full 3 sets of 10.  Did add challenged of weightrack resisted seated hamstring curls at 5#, and pt performs well with fatigue after 2 sets.  Attempted use of Nustep for additional lower extremity strength, but pt c/o increased R knee pain, so discontinued after 1 minute.  She is able to perform sit<>stand x 5 reps from mat with very little UE support, and she reports she feels she is stronger.  She is to have cardiac cath on Friday of this week, and should have one more session here prior to that.  She will continue to benefit from skilled PT towards goals for improved functional  mobility and independence.  OBJECTIVE IMPAIRMENTS: Abnormal gait, cardiopulmonary status limiting activity, decreased activity tolerance, decreased balance, decreased endurance, decreased mobility, difficulty walking, decreased strength, postural dysfunction, and pain.   ACTIVITY LIMITATIONS: carrying, lifting, bending, standing, transfers, reach over head, hygiene/grooming, and locomotion level  PARTICIPATION LIMITATIONS: meal prep, cleaning, laundry, and community activity  PERSONAL FACTORS: 3+ comorbidities: significant medical history (see above)  are also affecting patient's functional outcome.   REHAB POTENTIAL: Good  CLINICAL DECISION MAKING: Evolving/moderate complexity  EVALUATION COMPLEXITY: Moderate  PLAN:  PT FREQUENCY: 2x/week  PT DURATION: 8 weeks plus eval (*Would like to work towards 1x/wk in pool, 1x/wk in clinic)  PLANNED  INTERVENTIONS: Therapeutic exercises, Therapeutic activity, Neuromuscular re-education, Balance training, Gait training, Patient/Family education, Self Care, Aquatic Therapy, and Manual therapy  PLAN FOR NEXT SESSION: Check STGs.  Progress to standing balance and gentle standing strengthening (be aware of knee pain).  Monitor vitals.  (May have to stay in clinic 2x/wk until her BLE edema/weeping is under control)  *AQUATICS: Frequency: 1x/wk  Duration: 4 weeks Special Instruction: Aquatic therapy to assist with pt's tolerance for strengthening, balance, gait activities in aquatic setting, due to pt's extensive history of joint pain, to aid with pt's ability to progress towards PT goals.   Gean Maidens., PT 05/16/2023, 2:51 PM  [facility] at [facility] [address] [place], [address] [place], [place] [address] Phone # [phone] Fax # [phone]

## 2023-05-18 ENCOUNTER — Ambulatory Visit: Payer: Medicare PPO

## 2023-05-18 DIAGNOSIS — R2689 Other abnormalities of gait and mobility: Secondary | ICD-10-CM | POA: Diagnosis not present

## 2023-05-18 DIAGNOSIS — M6281 Muscle weakness (generalized): Secondary | ICD-10-CM

## 2023-05-18 DIAGNOSIS — I959 Hypotension, unspecified: Secondary | ICD-10-CM | POA: Diagnosis not present

## 2023-05-18 DIAGNOSIS — R2681 Unsteadiness on feet: Secondary | ICD-10-CM

## 2023-05-18 DIAGNOSIS — K7581 Nonalcoholic steatohepatitis (NASH): Secondary | ICD-10-CM | POA: Diagnosis not present

## 2023-05-18 DIAGNOSIS — I350 Nonrheumatic aortic (valve) stenosis: Secondary | ICD-10-CM | POA: Diagnosis not present

## 2023-05-18 DIAGNOSIS — I4821 Permanent atrial fibrillation: Secondary | ICD-10-CM | POA: Diagnosis not present

## 2023-05-18 DIAGNOSIS — K7460 Unspecified cirrhosis of liver: Secondary | ICD-10-CM | POA: Diagnosis not present

## 2023-05-18 DIAGNOSIS — I5032 Chronic diastolic (congestive) heart failure: Secondary | ICD-10-CM | POA: Diagnosis not present

## 2023-05-18 NOTE — Therapy (Signed)
OUTPATIENT PHYSICAL THERAPY NEURO TREATMENT   Patient Name: Hannah Khan MRN: [id number] DOB:October 24, 1955, 67 y.o., female Today's Date: 05/18/2023   PCP: Darrow Bussing, MD REFERRING PROVIDER: Alver Sorrow, NP   END OF SESSION:  PT End of Session - 05/18/23 1537     Visit Number 7    Number of Visits 17    Date for PT Re-Evaluation 06/23/23    Authorization Type [employer]    Authorization Time Period 04/27/2023-06/23/2023    Authorization - Visit Number 6    Authorization - Number of Visits 17    PT Start Time 1530    PT Stop Time 1615    PT Time Calculation (min) 45 min    Activity Tolerance Patient tolerated treatment well    Behavior During Therapy WFL for tasks assessed/performed                Past Medical History:  Diagnosis Date   Anemia    hx of   Aortic stenosis 02/2023   Echo 03/07/23 LVEF 55-60%, mild to moderate AS   Arthritis    knees   CHF (congestive heart failure) (HCC)    chronic diastolic   Diabetes mellitus without complication (HCC)    type 2   Dyspnea    Dysrhythmia    Fibromyalgia    Gastric varices    large   H/O transfusion of platelets    Heart murmur    never has caused any problems   Hx of colonic polyps    s/p partial colectomy   Hyperlipidemia    Hypertension    Joint pain    in knees and back spasms   Left leg swelling    wear compression hose   Liver cirrhosis secondary to NASH (nonalcoholic steatohepatitis) (HCC) dx nov 2014   had enlarged spleen also    Morbid obesity (HCC)    Persistent atrial fibrillation (HCC)    Pneumonia 10/2016   x 5   PONV (postoperative nausea and vomiting)    in past none recent   Portal hypertension (HCC)    Sleep apnea    uses cpap, setting varies between 14-20   Spleen enlarged    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 [facility])    Past Surgical History:  Procedure Laterality Date   BREAST LUMPECTOMY WITH RADIOACTIVE SEED LOCALIZATION Right 08/29/2017   Procedure: RIGHT BREAST  LUMPECTOMY WITH RADIOACTIVE SEEDS LOCALIZATION;  Surgeon: Harriette Bouillon, MD;  Location: [facility];  Service: General;  Laterality: Right;   CESAREAN SECTION  1989   CHOLECYSTECTOMY  20 yrs ago   COLECTOMY  2011   COLONOSCOPY     COLONOSCOPY N/A 09/05/2018   Procedure: COLONOSCOPY;  Surgeon: Willis Modena, MD;  Location: [facility];  Service: Endoscopy;  Laterality: N/A;   COLONOSCOPY WITH PROPOFOL Bilateral 05/04/2022   Procedure: COLONOSCOPY WITH PROPOFOL;  Surgeon: Willis Modena, MD;  Location: [facility];  Service: Gastroenterology;  Laterality: Bilateral;   DILATATION & CURETTAGE/HYSTEROSCOPY WITH MYOSURE N/A 05/06/2016   Procedure: DILATATION & CURETTAGE/HYSTEROSCOPY WITH MYOSURE WITH POLYPECTOMY;  Surgeon: Myna Hidalgo, DO;  Location: [facility];  Service: Gynecology;  Laterality: N/A;   ESOPHAGOGASTRODUODENOSCOPY (EGD) WITH PROPOFOL N/A 09/04/2013   Procedure: ESOPHAGOGASTRODUODENOSCOPY (EGD) WITH PROPOFOL;  Surgeon: Willis Modena, MD;  Location: [facility];  Service: Endoscopy;  Laterality: N/A;   ESOPHAGOGASTRODUODENOSCOPY (EGD) WITH PROPOFOL Bilateral 05/04/2022   Procedure: ESOPHAGOGASTRODUODENOSCOPY (EGD) WITH PROPOFOL;  Surgeon: Willis Modena, MD;  Location: [facility];  Service: Gastroenterology;  Laterality: Bilateral;  IR INTRAVASCULAR ULTRASOUND NON CORONARY  12/23/2022   IR RADIOLOGIST EVAL & MGMT  09/21/2022   IR RADIOLOGIST EVAL & MGMT  10/06/2022   IR RADIOLOGIST EVAL & MGMT  11/02/2022   IR RADIOLOGIST EVAL & MGMT  01/31/2023   IR TIPS  12/23/2022   IR US GUIDE VASC ACCESS RIGHT  12/23/2022   KNEE ARTHROSCOPY  yrs ago   bilateral, one done x 1, one done twice   POLYPECTOMY  09/05/2018   Procedure: POLYPECTOMY;  Surgeon: Willis Modena, MD;  Location: [facility];  Service: Endoscopy;;   RADIOLOGY WITH ANESTHESIA N/A 12/23/2022   Procedure: TIPS;  Surgeon: Bennie Dallas, MD;  Location: [facility];  Service: Radiology;  Laterality: N/A;   Patient Active Problem List    Diagnosis Date Noted   Intractable nausea and vomiting 04/07/2023   Intractable vomiting with nausea 04/06/2023   Acute diastolic congestive heart failure (HCC) 03/06/2023   Idiopathic hypotension 03/06/2023   Sepsis due to cellulitis (HCC) 03/05/2023   Severe sepsis (HCC) 03/05/2023   Portal hypertension (HCC) 12/23/2022   Acute encephalopathy 01/10/2021   Secondary hypercoagulable state (HCC) 06/23/2020   Streptococcal bacteremia 03/16/2020   Chronic venous stasis dermatitis of both lower extremities 03/16/2020   Atrial fibrillation with RVR (HCC) 04/26/2018   Acute lower UTI 04/26/2018   Liver cirrhosis secondary to NASH (nonalcoholic steatohepatitis) (HCC) 04/26/2018   Hyperlipidemia 04/26/2018   Type 2 diabetes mellitus with other specified complication (HCC)    Cellulitis of right lower extremity 03/15/2018   Sepsis (HCC) 03/14/2018   Community acquired pneumonia of right lower lobe of lung 10/31/2016   Muscular abdominal pain in right upper quadrant 07/11/2016   Chronic diastolic heart failure (HCC) 07/10/2016   Thrombocytopenia (HCC) 07/10/2016   Permanent atrial fibrillation (HCC) 01/30/2016   Type 2 diabetes mellitus without complication, without long-term current use of insulin (HCC) 01/30/2016   Morbid obesity- 12/10/2015   Cellulitis, abdominal wall    Melena 01/31/2015   Aortic heart murmur 01/31/2015   Pancytopenia (HCC) 07/23/2013   Obstructive sleep apnea 08/07/2007   Essential hypertension 08/07/2007   ALLERGIC RHINITIS 08/07/2007    ONSET DATE: 04/18/2023 (MD referral)  REFERRING DIAG:  I50.32 (ICD-10-CM) - Chronic diastolic heart failure (HCC)  G64.40 (ICD-10-CM) - Permanent atrial fibrillation (HCC)  K75.81,K74.60 (ICD-10-CM) - Liver cirrhosis secondary to NASH (nonalcoholic steatohepatitis) (HCC)  I35.0 (ICD-10-CM) - Aortic valve stenosis, etiology of cardiac valve disease unspecified  I95.9 (ICD-10-CM) - Hypotension, unspecified hypotension type   R53.81 (ICD-10-CM) - Physical deconditioning  E87.6 (ICD-10-CM) - Hypokalemia  I10 (ICD-10-CM) - Essential hypertension    THERAPY DIAG:  Muscle weakness (generalized)  Unsteadiness on feet  Other abnormalities of gait and mobility  Rationale for Evaluation and Treatment: Rehabilitation  SUBJECTIVE:  SUBJECTIVE STATEMENT: Doing ok, having a cardiac cath procedure tomorrow  Pt accompanied by: self  PERTINENT HISTORY: hx of chronic diastolic heart failure, morbid obesity, OSA, NASH/cirrhosis, chronic thrombocytopenia, DM2, aortic stenosis, vasovagal presyncope, chronic atrial fibrillation not on anticoagulation. Fracture of R shoulder and partial tear/ scar tissue RTC  2019 (s/p bicepectomy)  PAIN:  Are you having pain? Yes: NPRS scale: 5/10 Pain location: knee Pain description: achy, pain all the time Aggravating factors: unsure, hurts all the time Relieving factors: unsure  PRECAUTIONS: Fall and Other: morbid obesity, hx of low BP, cardiac issues ; 3# lifting restriction; a-fib  RED FLAGS: None   WEIGHT BEARING RESTRICTIONS: No  FALLS: Has patient fallen in last 6 months? Yes. Number of falls 2  LIVING ENVIRONMENT: Lives with: lives with their spouse Lives in: House/apartment Stairs:  1 step into the home from garage Has following equipment at home: Dan Humphreys - 4 wheeled  PLOF: Independent with household mobility with device and Independent with community mobility with device  PATIENT GOALS: Want to be able to get around better and do things in my house.  OBJECTIVE:   TODAY'S TREATMENT: 05/18/23 Activity Comments  Bilateral lower extremity seated ex: LAQ 3 x 10 reps Marching 3 x 10 Ankle pumps 3 x 10 Hip add iso 3x10 Seated clamshells 3x10--manual resistance Hamstring curls  3x10--red band   Sidestepping x 1.5 min Along counter  Seated single arm rows 3x10 10#  5xSTS 14 sec           TODAY'S TREATMENT: 05/16/2023 Activity Comments  Vitals 106/50, 97%, HR 85-90   Bilateral lower extremity seated ex: LAQ 3 x 10 reps Marching 3 x 10 Ankle pumps 3 x 10 2#  Seated hip abduction 3 x 10 Seated glut sets 3 x 10  2#  Seated hamstring curls, 5# at weightrack, 2 x 10 Good form, fatigued at end  BUE shoulder flexion 10 reps, 2#   Scapular retraction 10 reps   Attempted NuSTep, Level 2, 4 extremities x 1 min Did not continue due to knee pain  Sit to stand,  reps from mat surface, hands on knees support No c/o increased knee pain      PATIENT EDUCATION: Education details: Continue current HEP-look to adding weight (adjustable) for ankle weight progression for strengthening at home Person educated: Patient Education method: Explanation, Demonstration, and Handouts Education comprehension: verbalized understanding, returned demonstration, and needs further education   HOME EXERCISE PROGRAM: Access Code: [email or web address] URL: [email or web address] Date: 05/03/2023 Prepared by: [facility] - Outpatient  Rehab - [facility]  Exercises - Seated Pelvic Tilts  - 1 x daily - 7 x weekly - 3 sets - 5 reps - Seated Shoulder Rolls  - 1 x daily - 7 x weekly - 3 sets - 5 reps - Seated Long Arc Quad  - 1 x daily - 7 x weekly - 2-3 sets - 10 reps - Seated March  - 1 x daily - 7 x weekly - 2-3 sets - 10 reps (Pt is also performing seated stepping out/in, glut sets, scapular retraction 3 sets of 10) --------------------------------------------------------------------------------- Objective measures below taken at initial evaluation:  DIAGNOSTIC FINDINGS: NA  COGNITION: Overall cognitive status: Within functional limits for tasks assessed   SENSATION: Light touch: WFL  EDEMA:  BLE edema + 1 pitting (has compression stockings, but does not use  often)  POSTURE: rounded shoulders and forward head  LOWER EXTREMITY ROM:   A/ROM WFL in sitting (some limitations due to body  habitus)   LOWER EXTREMITY MMT:  Grossly tested 4/5 except knee extension L 3+/5   VITALS: BP 104/56 HR 69 O2 sats:  93>97%  TRANSFERS: Assistive device utilized: None  Sit to stand: SBA and uses BUE support Stand to sit: SBA and BUE support  GAIT: Gait pattern: step through pattern, antalgic, and wide BOS Distance walked: 50 ft Assistive device utilized: Environmental consultant - 4 wheeled Level of assistance: SBA Comments: Fatigues and has to stop gait to sit after 85 ft  FUNCTIONAL TESTS:  2 minute walk test: 85 ft; sits at 1 minute, in addition to 1 standing rest break 10 meter walk test: 21.03 sec = 1.56 ft/sec 5x sit to stand:  with UE support, 24.82 sec  TODAY'S TREATMENT:                                                                                                                              DATE: 04/27/2023    PATIENT EDUCATION: Education details: PT eval results, POC Person educated: Patient Education method: Explanation Education comprehension: verbalized understanding  HOME EXERCISE PROGRAM: Not yet initiated  GOALS: Goals reviewed with patient? Yes  SHORT TERM GOALS: Target date: 05/26/2023  Pt will be independent with HEP for improved strength, balance, gait, decreased pain. Baseline: Goal status: MET  2.  Pt will improve 5x sit<>stand to less than or equal to 20 sec to demonstrate improved functional strength and transfer efficiency. Baseline: 24.82 sec; (05/18/23) 14 sec Goal status: MET  3. Pt will ambulate at least 115 in 2 MWT for improved gait efficiency in home. Baseline: 85 ft. With one sitting break after 1 minute; (05/18/23) 275 ft Goal status: MET  LONG TERM GOALS: Target date: 06/23/2023  Pt will be independent with progression of HEP for improved strength, balance, gait. Baseline:  Goal status: IN PROGRESS  2.  Pt will  improve 5x sit<>stand to less than or equal to 15 sec to demonstrate improved functional strength and transfer efficiency. Baseline: 24.82 sec Goal status: IN PROGRESS  3.  Pt will improve gait velocity to at least 2 ft/sec for improved gait efficiency and safety. Baseline: 1.56 ft/sec Goal status: IN PROGRESS  4.  Pt will ambulate at least 150 ft in 2 MWT for improved gait efficiency in home environment. Baseline:  Goal status: IN PROGRESS  5.  Pt will verbalize ongoing plans for continued community fitness upon d/c from PT to maximize gains made in PT.  Baseline:  Goal status:  IN PROGRESS   ASSESSMENT:   Initiated with seated LE strengthening open chain PRE to improve strength and activity tolerance. Progressed to brief standing activity at counter with limited tolerance due to knee pain and SOB.  Able to meet 3/3 STG with marked improvement wih 5xSTS and distance of exceeding goals. Continued sessions to progress POC details to improve strength and activity tolerance to improve mobility   CLINICAL IMPRESSION:     OBJECTIVE IMPAIRMENTS:  Abnormal gait, cardiopulmonary status limiting activity, decreased activity tolerance, decreased balance, decreased endurance, decreased mobility, difficulty walking, decreased strength, postural dysfunction, and pain.   ACTIVITY LIMITATIONS: carrying, lifting, bending, standing, transfers, reach over head, hygiene/grooming, and locomotion level  PARTICIPATION LIMITATIONS: meal prep, cleaning, laundry, and community activity  PERSONAL FACTORS: 3+ comorbidities: significant medical history (see above)  are also affecting patient's functional outcome.   REHAB POTENTIAL: Good  CLINICAL DECISION MAKING: Evolving/moderate complexity  EVALUATION COMPLEXITY: Moderate  PLAN:  PT FREQUENCY: 2x/week  PT DURATION: 8 weeks plus eval (*Would like to work towards 1x/wk in pool, 1x/wk in clinic)  PLANNED INTERVENTIONS: Therapeutic exercises,  Therapeutic activity, Neuromuscular re-education, Balance training, Gait training, Patient/Family education, Self Care, Aquatic Therapy, and Manual therapy  PLAN FOR NEXT SESSION:   Progress to standing balance and gentle standing strengthening (be aware of knee pain).  Monitor vitals.  (May have to stay in clinic 2x/wk until her BLE edema/weeping is under control)  *AQUATICS: Frequency: 1x/wk  Duration: 4 weeks Special Instruction: Aquatic therapy to assist with pt's tolerance for strengthening, balance, gait activities in aquatic setting, due to pt's extensive history of joint pain, to aid with pt's ability to progress towards PT goals.   Dion Body, PT 05/18/2023, 3:37 PM  [facility] at [facility] [address] [place], [address] [place], [place] [address] Phone # [phone] Fax # [phone]

## 2023-05-19 ENCOUNTER — Encounter (HOSPITAL_COMMUNITY)
Admission: RE | Disposition: A | Discharge status: Home / Self Care | Payer: Self-pay | Source: Home / Self Care | Attending: Cardiology

## 2023-05-19 ENCOUNTER — Other Ambulatory Visit: Payer: Self-pay

## 2023-05-19 ENCOUNTER — Ambulatory Visit (HOSPITAL_COMMUNITY)
Admission: RE | Admit: 2023-05-19 | Discharge: 2023-05-19 | Disposition: A | Discharge status: Home / Self Care | Payer: Medicare PPO | Attending: Cardiology | Admitting: Cardiology

## 2023-05-19 DIAGNOSIS — Z6841 Body Mass Index (BMI) 40.0 and over, adult: Secondary | ICD-10-CM | POA: Insufficient documentation

## 2023-05-19 DIAGNOSIS — E1122 Type 2 diabetes mellitus with diabetic chronic kidney disease: Secondary | ICD-10-CM | POA: Insufficient documentation

## 2023-05-19 DIAGNOSIS — I350 Nonrheumatic aortic (valve) stenosis: Secondary | ICD-10-CM | POA: Insufficient documentation

## 2023-05-19 DIAGNOSIS — I5032 Chronic diastolic (congestive) heart failure: Secondary | ICD-10-CM | POA: Diagnosis not present

## 2023-05-19 DIAGNOSIS — I482 Chronic atrial fibrillation, unspecified: Secondary | ICD-10-CM | POA: Insufficient documentation

## 2023-05-19 DIAGNOSIS — I509 Heart failure, unspecified: Secondary | ICD-10-CM

## 2023-05-19 DIAGNOSIS — Z87891 Personal history of nicotine dependence: Secondary | ICD-10-CM | POA: Insufficient documentation

## 2023-05-19 DIAGNOSIS — K7460 Unspecified cirrhosis of liver: Secondary | ICD-10-CM | POA: Diagnosis not present

## 2023-05-19 DIAGNOSIS — Z79899 Other long term (current) drug therapy: Secondary | ICD-10-CM | POA: Insufficient documentation

## 2023-05-19 DIAGNOSIS — I130 Hypertensive heart and chronic kidney disease with heart failure and stage 1 through stage 4 chronic kidney disease, or unspecified chronic kidney disease: Secondary | ICD-10-CM | POA: Diagnosis not present

## 2023-05-19 DIAGNOSIS — I272 Pulmonary hypertension, unspecified: Secondary | ICD-10-CM

## 2023-05-19 DIAGNOSIS — D696 Thrombocytopenia, unspecified: Secondary | ICD-10-CM | POA: Diagnosis not present

## 2023-05-19 DIAGNOSIS — K7581 Nonalcoholic steatohepatitis (NASH): Secondary | ICD-10-CM | POA: Insufficient documentation

## 2023-05-19 DIAGNOSIS — N189 Chronic kidney disease, unspecified: Secondary | ICD-10-CM | POA: Insufficient documentation

## 2023-05-19 DIAGNOSIS — G4733 Obstructive sleep apnea (adult) (pediatric): Secondary | ICD-10-CM | POA: Insufficient documentation

## 2023-05-19 HISTORY — PX: RIGHT HEART CATH: CATH118263

## 2023-05-19 LAB — POCT I-STAT EG7
Acid-Base Excess: 2 mmol/L (ref 0.0–2.0)
Acid-Base Excess: 2 mmol/L (ref 0.0–2.0)
Bicarbonate: 26.7 mmol/L (ref 20.0–28.0)
Bicarbonate: 26.7 mmol/L (ref 20.0–28.0)
Calcium, Ion: 1.17 mmol/L (ref 1.15–1.40)
Calcium, Ion: 1.19 mmol/L (ref 1.15–1.40)
HCT: 31 % — ABNORMAL LOW (ref 36.0–46.0)
HCT: 31 % — ABNORMAL LOW (ref 36.0–46.0)
Hemoglobin: 10.5 g/dL — ABNORMAL LOW (ref 12.0–15.0)
Hemoglobin: 10.5 g/dL — ABNORMAL LOW (ref 12.0–15.0)
O2 Saturation: 74 %
O2 Saturation: 74 %
Potassium: 3.6 mmol/L (ref 3.5–5.1)
Potassium: 3.6 mmol/L (ref 3.5–5.1)
Sodium: 139 mmol/L (ref 135–145)
Sodium: 139 mmol/L (ref 135–145)
TCO2: 28 mmol/L (ref 22–32)
TCO2: 28 mmol/L (ref 22–32)
pCO2, Ven: 39.9 mmHg — ABNORMAL LOW (ref 44–60)
pCO2, Ven: 40 mmHg — ABNORMAL LOW (ref 44–60)
pH, Ven: 7.433 — ABNORMAL HIGH (ref 7.25–7.43)
pH, Ven: 7.433 — ABNORMAL HIGH (ref 7.25–7.43)
pO2, Ven: 38 mmHg (ref 32–45)
pO2, Ven: 38 mmHg (ref 32–45)

## 2023-05-19 LAB — BASIC METABOLIC PANEL
Anion gap: 13 (ref 5–15)
BUN: 16 mg/dL (ref 8–23)
CO2: 26 mmol/L (ref 22–32)
Calcium: 8.7 mg/dL — ABNORMAL LOW (ref 8.9–10.3)
Chloride: 99 mmol/L (ref 98–111)
Creatinine, Ser: 1.54 mg/dL — ABNORMAL HIGH (ref 0.44–1.00)
GFR, Estimated: 37 mL/min — ABNORMAL LOW (ref >60)
Glucose, Bld: 132 mg/dL — ABNORMAL HIGH (ref 70–99)
Potassium: 3.3 mmol/L — ABNORMAL LOW (ref 3.5–5.1)
Sodium: 138 mmol/L (ref 135–145)

## 2023-05-19 LAB — GLUCOSE, CAPILLARY: Glucose-Capillary: 109 mg/dL — ABNORMAL HIGH (ref 70–99)

## 2023-05-19 SURGERY — RIGHT HEART CATH
Anesthesia: LOCAL

## 2023-05-19 MED ORDER — SODIUM CHLORIDE 0.9% FLUSH: 3.0000 mL | INTRAVENOUS | Status: DC | PRN

## 2023-05-19 MED ORDER — LIDOCAINE HCL (PF) 1 % IJ SOLN
INTRAMUSCULAR | Status: DC | PRN
  Administered 2023-05-19: 2 mL

## 2023-05-19 MED ORDER — SODIUM CHLORIDE 0.9 % IV SOLN: INTRAVENOUS | Status: DC

## 2023-05-19 MED ORDER — LIDOCAINE HCL (PF) 1 % IJ SOLN
INTRAMUSCULAR | Status: AC
  Filled 2023-05-19: qty 30

## 2023-05-19 MED ORDER — POTASSIUM CHLORIDE CRYS ER 20 MEQ PO TBCR
40.0000 meq | EXTENDED_RELEASE_TABLET | Freq: Once | ORAL | Status: AC
  Administered 2023-05-19: 40 meq via ORAL
  Filled 2023-05-19 (×2): qty 2

## 2023-05-19 MED ORDER — LABETALOL HCL 5 MG/ML IV SOLN: 10.0000 mg | INTRAVENOUS | Status: DC | PRN

## 2023-05-19 MED ORDER — HEPARIN (PORCINE) IN NACL 1000-0.9 UT/500ML-% IV SOLN
INTRAVENOUS | Status: DC | PRN
  Administered 2023-05-19: 500 mL

## 2023-05-19 MED ORDER — HYDRALAZINE HCL 20 MG/ML IJ SOLN: 10.0000 mg | INTRAMUSCULAR | Status: DC | PRN

## 2023-05-19 MED ORDER — ONDANSETRON HCL 4 MG/2ML IJ SOLN: 4.0000 mg | Freq: Four times a day (QID) | INTRAMUSCULAR | Status: DC | PRN

## 2023-05-19 MED ORDER — SODIUM CHLORIDE 0.9% FLUSH: 3.0000 mL | Freq: Two times a day (BID) | INTRAVENOUS | Status: DC

## 2023-05-19 MED ORDER — SODIUM CHLORIDE 0.9 % IV SOLN: 250.0000 mL | INTRAVENOUS | Status: DC | PRN

## 2023-05-19 SURGICAL SUPPLY — 5 items
CATH SWAN GANZ 7F STRAIGHT (CATHETERS) IMPLANT
GLIDESHEATH SLENDER 7FR .021G (SHEATH) IMPLANT
PACK CARDIAC CATHETERIZATION (CUSTOM PROCEDURE TRAY) ×1 IMPLANT
TRANSDUCER W/STOPCOCK (MISCELLANEOUS) IMPLANT
TUBING ART PRESS 72 MALE/FEM (TUBING) IMPLANT

## 2023-05-19 NOTE — Discharge Instructions (Signed)

## 2023-05-19 NOTE — Interval H&P Note (Signed)
History and Physical Interval Note:  05/19/2023 12:25 PM  Hannah Khan  has presented today for surgery, with the diagnosis of heart failure.  The various methods of treatment have been discussed with the patient and family. After consideration of risks, benefits and other options for treatment, the patient has consented to  Procedure(s): RIGHT HEART CATH (N/A) as a surgical intervention.  [name] history has been reviewed, patient examined, no change in status, stable for surgery.  I have reviewed the patient's chart and labs.  Questions were answered to the patient's satisfaction.     Nereida Schepp

## 2023-05-20 ENCOUNTER — Other Ambulatory Visit (HOSPITAL_COMMUNITY): Payer: Self-pay | Admitting: Cardiology

## 2023-05-23 ENCOUNTER — Ambulatory Visit: Payer: Medicare PPO | Attending: Family | Admitting: Physical Therapy

## 2023-05-23 ENCOUNTER — Ambulatory Visit: Payer: Medicare PPO | Admitting: Rehabilitation

## 2023-05-23 ENCOUNTER — Encounter (HOSPITAL_COMMUNITY): Payer: Self-pay | Admitting: Cardiology

## 2023-05-23 DIAGNOSIS — R2689 Other abnormalities of gait and mobility: Secondary | ICD-10-CM | POA: Diagnosis not present

## 2023-05-23 DIAGNOSIS — M6281 Muscle weakness (generalized): Secondary | ICD-10-CM | POA: Insufficient documentation

## 2023-05-23 DIAGNOSIS — R2681 Unsteadiness on feet: Secondary | ICD-10-CM | POA: Insufficient documentation

## 2023-05-23 NOTE — Addendum Note (Signed)
Encounter addended by: Dorthula Nettles, DO on: 05/23/2023 9:25 AM  Actions taken: Clinical Note Signed, Level of Service modified

## 2023-05-23 NOTE — Therapy (Signed)
OUTPATIENT PHYSICAL THERAPY NEURO TREATMENT   Patient Name: Hannah Khan MRN: [id number] DOB:Sep 05, 1956, 67 y.o., female Today's Date: 05/23/2023   PCP: Darrow Bussing, MD REFERRING PROVIDER: Alver Sorrow, NP   END OF SESSION:  PT End of Session - 05/23/23 1104     Visit Number 8    Number of Visits 17    Date for PT Re-Evaluation 06/23/23    Authorization Type [employer]    Authorization Time Period 04/27/2023-06/23/2023    Authorization - Visit Number 7    Authorization - Number of Visits 17    PT Start Time 1105    PT Stop Time 1148    PT Time Calculation (min) 43 min    Activity Tolerance Patient tolerated treatment well    Behavior During Therapy WFL for tasks assessed/performed                 Past Medical History:  Diagnosis Date   Anemia    hx of   Aortic stenosis 02/2023   Echo 03/07/23 LVEF 55-60%, mild to moderate AS   Arthritis    knees   CHF (congestive heart failure) (HCC)    chronic diastolic   Diabetes mellitus without complication (HCC)    type 2   Dyspnea    Dysrhythmia    Fibromyalgia    Gastric varices    large   H/O transfusion of platelets    Heart murmur    never has caused any problems   Hx of colonic polyps    s/p partial colectomy   Hyperlipidemia    Hypertension    Joint pain    in knees and back spasms   Left leg swelling    wear compression hose   Liver cirrhosis secondary to NASH (nonalcoholic steatohepatitis) (HCC) dx nov 2014   had enlarged spleen also    Morbid obesity (HCC)    Persistent atrial fibrillation (HCC)    Pneumonia 10/2016   x 5   PONV (postoperative nausea and vomiting)    in past none recent   Portal hypertension (HCC)    Sleep apnea    uses cpap, setting varies between 14-20   Spleen enlarged    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 [facility])    Past Surgical History:  Procedure Laterality Date   BREAST LUMPECTOMY WITH RADIOACTIVE SEED LOCALIZATION Right 08/29/2017   Procedure: RIGHT BREAST  LUMPECTOMY WITH RADIOACTIVE SEEDS LOCALIZATION;  Surgeon: Harriette Bouillon, MD;  Location: [facility] OR;  Service: General;  Laterality: Right;   CESAREAN SECTION  1989   CHOLECYSTECTOMY  20 yrs ago   COLECTOMY  2011   COLONOSCOPY     COLONOSCOPY N/A 09/05/2018   Procedure: COLONOSCOPY;  Surgeon: Willis Modena, MD;  Location: [facility];  Service: Endoscopy;  Laterality: N/A;   COLONOSCOPY WITH PROPOFOL Bilateral 05/04/2022   Procedure: COLONOSCOPY WITH PROPOFOL;  Surgeon: Willis Modena, MD;  Location: [facility];  Service: Gastroenterology;  Laterality: Bilateral;   DILATATION & CURETTAGE/HYSTEROSCOPY WITH MYOSURE N/A 05/06/2016   Procedure: DILATATION & CURETTAGE/HYSTEROSCOPY WITH MYOSURE WITH POLYPECTOMY;  Surgeon: Myna Hidalgo, DO;  Location: [facility];  Service: Gynecology;  Laterality: N/A;   ESOPHAGOGASTRODUODENOSCOPY (EGD) WITH PROPOFOL N/A 09/04/2013   Procedure: ESOPHAGOGASTRODUODENOSCOPY (EGD) WITH PROPOFOL;  Surgeon: Willis Modena, MD;  Location: [facility];  Service: Endoscopy;  Laterality: N/A;   ESOPHAGOGASTRODUODENOSCOPY (EGD) WITH PROPOFOL Bilateral 05/04/2022   Procedure: ESOPHAGOGASTRODUODENOSCOPY (EGD) WITH PROPOFOL;  Surgeon: Willis Modena, MD;  Location: [facility];  Service: Gastroenterology;  Laterality: Bilateral;  IR INTRAVASCULAR ULTRASOUND NON CORONARY  12/23/2022   IR RADIOLOGIST EVAL & MGMT  09/21/2022   IR RADIOLOGIST EVAL & MGMT  10/06/2022   IR RADIOLOGIST EVAL & MGMT  11/02/2022   IR RADIOLOGIST EVAL & MGMT  01/31/2023   IR TIPS  12/23/2022   IR US GUIDE VASC ACCESS RIGHT  12/23/2022   KNEE ARTHROSCOPY  yrs ago   bilateral, one done x 1, one done twice   POLYPECTOMY  09/05/2018   Procedure: POLYPECTOMY;  Surgeon: Willis Modena, MD;  Location: [facility];  Service: Endoscopy;;   RADIOLOGY WITH ANESTHESIA N/A 12/23/2022   Procedure: TIPS;  Surgeon: Bennie Dallas, MD;  Location: [facility];  Service: Radiology;  Laterality: N/A;   RIGHT HEART CATH N/A 05/19/2023    Procedure: RIGHT HEART CATH;  Surgeon: Dorthula Nettles, DO;  Location: [facility];  Service: Cardiovascular;  Laterality: N/A;   Patient Active Problem List   Diagnosis Date Noted   Intractable nausea and vomiting 04/07/2023   Intractable vomiting with nausea 04/06/2023   Acute diastolic congestive heart failure (HCC) 03/06/2023   Idiopathic hypotension 03/06/2023   Sepsis due to cellulitis (HCC) 03/05/2023   Severe sepsis (HCC) 03/05/2023   Portal hypertension (HCC) 12/23/2022   Acute encephalopathy 01/10/2021   Secondary hypercoagulable state (HCC) 06/23/2020   Streptococcal bacteremia 03/16/2020   Chronic venous stasis dermatitis of both lower extremities 03/16/2020   Atrial fibrillation with RVR (HCC) 04/26/2018   Acute lower UTI 04/26/2018   Liver cirrhosis secondary to NASH (nonalcoholic steatohepatitis) (HCC) 04/26/2018   Hyperlipidemia 04/26/2018   Type 2 diabetes mellitus with other specified complication (HCC)    Cellulitis of right lower extremity 03/15/2018   Sepsis (HCC) 03/14/2018   Community acquired pneumonia of right lower lobe of lung 10/31/2016   Muscular abdominal pain in right upper quadrant 07/11/2016   Chronic diastolic heart failure (HCC) 07/10/2016   Thrombocytopenia (HCC) 07/10/2016   Permanent atrial fibrillation (HCC) 01/30/2016   Type 2 diabetes mellitus without complication, without long-term current use of insulin (HCC) 01/30/2016   Morbid obesity- 12/10/2015   Cellulitis, abdominal wall    Melena 01/31/2015   Aortic heart murmur 01/31/2015   Pancytopenia (HCC) 07/23/2013   Obstructive sleep apnea 08/07/2007   Essential hypertension 08/07/2007   ALLERGIC RHINITIS 08/07/2007    ONSET DATE: 04/18/2023 (MD referral)  REFERRING DIAG:  I50.32 (ICD-10-CM) - Chronic diastolic heart failure (HCC)  W09.81 (ICD-10-CM) - Permanent atrial fibrillation (HCC)  K75.81,K74.60 (ICD-10-CM) - Liver cirrhosis secondary to NASH (nonalcoholic  steatohepatitis) (HCC)  I35.0 (ICD-10-CM) - Aortic valve stenosis, etiology of cardiac valve disease unspecified  I95.9 (ICD-10-CM) - Hypotension, unspecified hypotension type  R53.81 (ICD-10-CM) - Physical deconditioning  E87.6 (ICD-10-CM) - Hypokalemia  I10 (ICD-10-CM) - Essential hypertension    THERAPY DIAG:  Muscle weakness (generalized)  Unsteadiness on feet  Other abnormalities of gait and mobility  Rationale for Evaluation and Treatment: Rehabilitation  SUBJECTIVE:  SUBJECTIVE STATEMENT: Had the cardiac cath on Friday.  Not supposed to be immersed in water for 2 weeks.  Pressure and pumping action was good.  MD cleared me to have the TIPS procedure (to help clear blockage/stent placement).    Pt accompanied by: self  PERTINENT HISTORY: hx of chronic diastolic heart failure, morbid obesity, OSA, NASH/cirrhosis, chronic thrombocytopenia, DM2, aortic stenosis, vasovagal presyncope, chronic atrial fibrillation not on anticoagulation. Fracture of R shoulder and partial tear/ scar tissue RTC  2019 (s/p bicepectomy)  PAIN:  Are you having pain? Yes: NPRS scale: 1-2/10 Pain location: knee Pain description: achy, pain all the time Aggravating factors: unsure, hurts all the time Relieving factors: unsure New reported pain on L shoulder and L neck 4-5/10, moves and increases to 10/10 PRECAUTIONS: Fall and Other: morbid obesity, hx of low BP, cardiac issues ; 3# lifting restriction; a-fib.  05/23/2023:  Cardiac cath performed on 8/30 and not allowed to be immersed in water for 2 weeks.  No lifting/pushing/pulling with R arm.  RED FLAGS: None   WEIGHT BEARING RESTRICTIONS: No  FALLS: Has patient fallen in last 6 months? Yes. Number of falls 2  LIVING ENVIRONMENT: Lives with: lives with their  spouse Lives in: House/apartment Stairs:  1 step into the home from garage Has following equipment at home: Dan Humphreys - 4 wheeled  PLOF: Independent with household mobility with device and Independent with community mobility with device  PATIENT GOALS: Want to be able to get around better and do things in my house.  OBJECTIVE:  Vitals 90/56  TODAY'S TREATMENT: 05/23/2023 Activity Comments  Manual therapy to L upper traps, levator and neck area, sitting EOM Trigger points noted with increased pain; ended with hot pack to area during seated ex  LAQ 3 x 10 Seated hip abd/adduction, 3 x 10 LAQ 3 x 10 2#  Attempted neck exercises in sitting: -chin tuck with ball behind head, 2 x 5 reps -gentle neck stretch for L upper traps, 2 x 15 sec, L levator, 2 x 15 sec   -cues to back off to 50%  -still c/o pain, but is able to genlty increased ROM  Standing exercise:  marching in place x 5 reps Alt hip flexion/knee extended x 5 reps BUE support at locked rollator; pain in L shoulder          PATIENT EDUCATION: Education details: Gentle stretches to cervical spine (gentle ROM) as well as potential use of heat at home (pt has already tried ice) to lessen pain Person educated: Patient Education method: Explanation, Demonstration, and Handouts Education comprehension: verbalized understanding, returned demonstration, and needs further education   HOME EXERCISE PROGRAM: Access Code: [email or web address] URL: [email or web address] Date: 05/03/2023 Prepared by: [facility] - Outpatient  Rehab - [facility]  Exercises - Seated Pelvic Tilts  - 1 x daily - 7 x weekly - 3 sets - 5 reps - Seated Shoulder Rolls  - 1 x daily - 7 x weekly - 3 sets - 5 reps - Seated Long Arc Quad  - 1 x daily - 7 x weekly - 2-3 sets - 10 reps - Seated March  - 1 x daily - 7 x weekly - 2-3 sets - 10 reps (Pt is also performing seated stepping out/in, glut sets, scapular retraction 3 sets of  10) --------------------------------------------------------------------------------- Objective measures below taken at initial evaluation:  DIAGNOSTIC FINDINGS: NA  COGNITION: Overall cognitive status: Within functional limits for tasks assessed   SENSATION: Light touch: WFL  EDEMA:  BLE edema + 1 pitting (has compression stockings, but does not use often)  POSTURE: rounded shoulders and forward head  LOWER EXTREMITY ROM:   A/ROM WFL in sitting (some limitations due to body habitus)   LOWER EXTREMITY MMT:  Grossly tested 4/5 except knee extension L 3+/5   VITALS: BP 104/56 HR 69 O2 sats:  93>97%  TRANSFERS: Assistive device utilized: None  Sit to stand: SBA and uses BUE support Stand to sit: SBA and BUE support  GAIT: Gait pattern: step through pattern, antalgic, and wide BOS Distance walked: 50 ft Assistive device utilized: Environmental consultant - 4 wheeled Level of assistance: SBA Comments: Fatigues and has to stop gait to sit after 85 ft  FUNCTIONAL TESTS:  2 minute walk test: 85 ft; sits at 1 minute, in addition to 1 standing rest break 10 meter walk test: 21.03 sec = 1.56 ft/sec 5x sit to stand:  with UE support, 24.82 sec  TODAY'S TREATMENT:                                                                                                                              DATE: 04/27/2023    PATIENT EDUCATION: Education details: PT eval results, POC Person educated: Patient Education method: Explanation Education comprehension: verbalized understanding  HOME EXERCISE PROGRAM: Not yet initiated  GOALS: Goals reviewed with patient? Yes  SHORT TERM GOALS: Target date: 05/26/2023  Pt will be independent with HEP for improved strength, balance, gait, decreased pain. Baseline: Goal status: MET  2.  Pt will improve 5x sit<>stand to less than or equal to 20 sec to demonstrate improved functional strength and transfer efficiency. Baseline: 24.82 sec; (05/18/23) 14 sec Goal  status: MET  3. Pt will ambulate at least 115 in 2 MWT for improved gait efficiency in home. Baseline: 85 ft. With one sitting break after 1 minute; (05/18/23) 275 ft Goal status: MET  LONG TERM GOALS: Target date: 06/23/2023  Pt will be independent with progression of HEP for improved strength, balance, gait. Baseline:  Goal status: IN PROGRESS  2.  Pt will improve 5x sit<>stand to less than or equal to 12 sec to demonstrate improved functional strength and transfer efficiency. Baseline: 24.82 sec >14 sec Goal status: REVISED 05/23/2023  3.  Pt will improve gait velocity to at least 2 ft/sec for improved gait efficiency and safety. Baseline: 1.56 ft/sec Goal status: IN PROGRESS  4.  Pt will ambulate at least 325 ft in 2 MWT for improved gait efficiency in home environment. Baseline:  Goal status: REVISED, 05/23/2023  5.  Pt will verbalize ongoing plans for continued community fitness upon d/c from PT to maximize gains made in PT.  Baseline:  Goal status:  IN PROGRESS   ASSESSMENT:     CLINICAL IMPRESSION: Skilled PT session today focused on manual therapy to L shoulder area, as she had cardiac cath procedure on Friday, stayed in prolonged position in supine/no pillow.  Since then,she has  had increased L upper traps pain, up into L suboccipital area.  This is limiting sleep and her current exercises.  With session today, pt is very tender to palpation with likely trigger points noted in L upper traps, performed manual stretching and heat to area, with pt reporting some relief by end of session.  Also worked on seated stregnthening and standing exercises.  Pt limited in standing due to pain in L shoulder, neck with UE support at locked rollator.  She has been making nice progress, as evidenced by STGs being met last week.  Updated LTG for 5x sit<>Stand test and .  She will continue to benefit from skilled PT towards LTGs for improved functional mobility, strength and overall balance and  independence.  *Please note, pt decided today she would like to cancel aquatic appointments, due to restrictions from cardiac cath and continued fluid weeping from BLEs.    OBJECTIVE IMPAIRMENTS: Abnormal gait, cardiopulmonary status limiting activity, decreased activity tolerance, decreased balance, decreased endurance, decreased mobility, difficulty walking, decreased strength, postural dysfunction, and pain.   ACTIVITY LIMITATIONS: carrying, lifting, bending, standing, transfers, reach over head, hygiene/grooming, and locomotion level  PARTICIPATION LIMITATIONS: meal prep, cleaning, laundry, and community activity  PERSONAL FACTORS: 3+ comorbidities: significant medical history (see above)  are also affecting patient's functional outcome.   REHAB POTENTIAL: Good  CLINICAL DECISION MAKING: Evolving/moderate complexity  EVALUATION COMPLEXITY: Moderate  PLAN:  PT FREQUENCY: 2x/week  PT DURATION: 8 weeks plus eval (*will continue 2x/wk in clinic, per pt request, 05/23/2023)  PLANNED INTERVENTIONS: Therapeutic exercises, Therapeutic activity, Neuromuscular re-education, Balance training, Gait training, Patient/Family education, Self Care, Aquatic Therapy, and Manual therapy  PLAN FOR NEXT SESSION:  Ask about neck pain and work on gentle stretching, massage to help lessen tightness and pain in L neck. Progress to standing balance and gentle standing strengthening (be aware of knee pain).  Monitor vitals.     Gean Maidens., PT 05/23/2023, 12:01 PM  [facility] [address] [place], [address] [place], [place] [address] Phone # [phone] Fax # [phone]

## 2023-05-25 ENCOUNTER — Ambulatory Visit: Payer: Medicare PPO | Admitting: Physical Therapy

## 2023-05-25 ENCOUNTER — Encounter: Payer: Self-pay | Admitting: Physical Therapy

## 2023-05-25 ENCOUNTER — Other Ambulatory Visit: Payer: Self-pay | Admitting: Interventional Radiology

## 2023-05-25 DIAGNOSIS — R2681 Unsteadiness on feet: Secondary | ICD-10-CM | POA: Diagnosis not present

## 2023-05-25 DIAGNOSIS — M6281 Muscle weakness (generalized): Secondary | ICD-10-CM | POA: Diagnosis not present

## 2023-05-25 DIAGNOSIS — R2689 Other abnormalities of gait and mobility: Secondary | ICD-10-CM | POA: Diagnosis not present

## 2023-05-25 DIAGNOSIS — K7460 Unspecified cirrhosis of liver: Secondary | ICD-10-CM

## 2023-05-25 NOTE — Therapy (Signed)
OUTPATIENT PHYSICAL THERAPY NEURO TREATMENT   Patient Name: Hannah Khan MRN: [id number] DOB:1956-01-21, 67 y.o., female Today's Date: 05/25/2023   PCP: Darrow Bussing, MD REFERRING PROVIDER: Alver Sorrow, NP   END OF SESSION:  PT End of Session - 05/25/23 1455     Visit Number 9    Number of Visits 17    Date for PT Re-Evaluation 06/23/23    Authorization Type [employer]    Authorization Time Period 04/27/2023-06/23/2023    Authorization - Visit Number 8    Authorization - Number of Visits 17    PT Start Time 1456   pt arrives late   PT Stop Time 1529    PT Time Calculation (min) 33 min    Activity Tolerance Patient tolerated treatment well    Behavior During Therapy [facility] for tasks assessed/performed                  Past Medical History:  Diagnosis Date   Anemia    hx of   Aortic stenosis 02/2023   Echo 03/07/23 LVEF 55-60%, mild to moderate AS   Arthritis    knees   CHF (congestive heart failure) (HCC)    chronic diastolic   Diabetes mellitus without complication (HCC)    type 2   Dyspnea    Dysrhythmia    Fibromyalgia    Gastric varices    large   H/O transfusion of platelets    Heart murmur    never has caused any problems   Hx of colonic polyps    s/p partial colectomy   Hyperlipidemia    Hypertension    Joint pain    in knees and back spasms   Left leg swelling    wear compression hose   Liver cirrhosis secondary to NASH (nonalcoholic steatohepatitis) (HCC) dx nov 2014   had enlarged spleen also    Morbid obesity (HCC)    Persistent atrial fibrillation (HCC)    Pneumonia 10/2016   x 5   PONV (postoperative nausea and vomiting)    in past none recent   Portal hypertension (HCC)    Sleep apnea    uses cpap, setting varies between 14-20   Spleen enlarged    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 [facility])    Past Surgical History:  Procedure Laterality Date   BREAST LUMPECTOMY WITH RADIOACTIVE SEED LOCALIZATION Right 08/29/2017    Procedure: RIGHT BREAST LUMPECTOMY WITH RADIOACTIVE SEEDS LOCALIZATION;  Surgeon: Harriette Bouillon, MD;  Location: [facility] OR;  Service: General;  Laterality: Right;   CESAREAN SECTION  1989   CHOLECYSTECTOMY  20 yrs ago   COLECTOMY  2011   COLONOSCOPY     COLONOSCOPY N/A 09/05/2018   Procedure: COLONOSCOPY;  Surgeon: Willis Modena, MD;  Location: [facility];  Service: Endoscopy;  Laterality: N/A;   COLONOSCOPY WITH PROPOFOL Bilateral 05/04/2022   Procedure: COLONOSCOPY WITH PROPOFOL;  Surgeon: Willis Modena, MD;  Location: [facility];  Service: Gastroenterology;  Laterality: Bilateral;   DILATATION & CURETTAGE/HYSTEROSCOPY WITH MYOSURE N/A 05/06/2016   Procedure: DILATATION & CURETTAGE/HYSTEROSCOPY WITH MYOSURE WITH POLYPECTOMY;  Surgeon: Myna Hidalgo, DO;  Location: [facility];  Service: Gynecology;  Laterality: N/A;   ESOPHAGOGASTRODUODENOSCOPY (EGD) WITH PROPOFOL N/A 09/04/2013   Procedure: ESOPHAGOGASTRODUODENOSCOPY (EGD) WITH PROPOFOL;  Surgeon: Willis Modena, MD;  Location: [facility];  Service: Endoscopy;  Laterality: N/A;   ESOPHAGOGASTRODUODENOSCOPY (EGD) WITH PROPOFOL Bilateral 05/04/2022   Procedure: ESOPHAGOGASTRODUODENOSCOPY (EGD) WITH PROPOFOL;  Surgeon: Willis Modena, MD;  Location: [facility];  Service:  Gastroenterology;  Laterality: Bilateral;   IR INTRAVASCULAR ULTRASOUND NON CORONARY  12/23/2022   IR RADIOLOGIST EVAL & MGMT  09/21/2022   IR RADIOLOGIST EVAL & MGMT  10/06/2022   IR RADIOLOGIST EVAL & MGMT  11/02/2022   IR RADIOLOGIST EVAL & MGMT  01/31/2023   IR TIPS  12/23/2022   IR US GUIDE VASC ACCESS RIGHT  12/23/2022   KNEE ARTHROSCOPY  yrs ago   bilateral, one done x 1, one done twice   POLYPECTOMY  09/05/2018   Procedure: POLYPECTOMY;  Surgeon: Willis Modena, MD;  Location: [facility];  Service: Endoscopy;;   RADIOLOGY WITH ANESTHESIA N/A 12/23/2022   Procedure: TIPS;  Surgeon: Bennie Dallas, MD;  Location: [facility] OR;  Service: Radiology;  Laterality: N/A;   RIGHT HEART  CATH N/A 05/19/2023   Procedure: RIGHT HEART CATH;  Surgeon: Dorthula Nettles, DO;  Location: [facility];  Service: Cardiovascular;  Laterality: N/A;   Patient Active Problem List   Diagnosis Date Noted   Intractable nausea and vomiting 04/07/2023   Intractable vomiting with nausea 04/06/2023   Acute diastolic congestive heart failure (HCC) 03/06/2023   Idiopathic hypotension 03/06/2023   Sepsis due to cellulitis (HCC) 03/05/2023   Severe sepsis (HCC) 03/05/2023   Portal hypertension (HCC) 12/23/2022   Acute encephalopathy 01/10/2021   Secondary hypercoagulable state (HCC) 06/23/2020   Streptococcal bacteremia 03/16/2020   Chronic venous stasis dermatitis of both lower extremities 03/16/2020   Atrial fibrillation with RVR (HCC) 04/26/2018   Acute lower UTI 04/26/2018   Liver cirrhosis secondary to NASH (nonalcoholic steatohepatitis) (HCC) 04/26/2018   Hyperlipidemia 04/26/2018   Type 2 diabetes mellitus with other specified complication (HCC)    Cellulitis of right lower extremity 03/15/2018   Sepsis (HCC) 03/14/2018   Community acquired pneumonia of right lower lobe of lung 10/31/2016   Muscular abdominal pain in right upper quadrant 07/11/2016   Chronic diastolic heart failure (HCC) 07/10/2016   Thrombocytopenia (HCC) 07/10/2016   Permanent atrial fibrillation (HCC) 01/30/2016   Type 2 diabetes mellitus without complication, without long-term current use of insulin (HCC) 01/30/2016   Morbid obesity- 12/10/2015   Cellulitis, abdominal wall    Melena 01/31/2015   Aortic heart murmur 01/31/2015   Pancytopenia (HCC) 07/23/2013   Obstructive sleep apnea 08/07/2007   Essential hypertension 08/07/2007   ALLERGIC RHINITIS 08/07/2007    ONSET DATE: 04/18/2023 (MD referral)  REFERRING DIAG:  I50.32 (ICD-10-CM) - Chronic diastolic heart failure (HCC)  A41.66 (ICD-10-CM) - Permanent atrial fibrillation (HCC)  K75.81,K74.60 (ICD-10-CM) - Liver cirrhosis secondary to NASH  (nonalcoholic steatohepatitis) (HCC)  I35.0 (ICD-10-CM) - Aortic valve stenosis, etiology of cardiac valve disease unspecified  I95.9 (ICD-10-CM) - Hypotension, unspecified hypotension type  R53.81 (ICD-10-CM) - Physical deconditioning  E87.6 (ICD-10-CM) - Hypokalemia  I10 (ICD-10-CM) - Essential hypertension    THERAPY DIAG:  Muscle weakness (generalized)  Unsteadiness on feet  Other abnormalities of gait and mobility  Rationale for Evaluation and Treatment: Rehabilitation  SUBJECTIVE:  SUBJECTIVE STATEMENT: Had some stomach issues last night due to trying to add another food back in.  Almost called to cancel. Pt accompanied by: self  PERTINENT HISTORY: hx of chronic diastolic heart failure, morbid obesity, OSA, NASH/cirrhosis, chronic thrombocytopenia, DM2, aortic stenosis, vasovagal presyncope, chronic atrial fibrillation not on anticoagulation. Fracture of R shoulder and partial tear/ scar tissue RTC  2019 (s/p bicepectomy)  PAIN:  Are you having pain? Yes: NPRS scale: 6-7/10 Pain location: knee Pain description: achy, pain all the time Aggravating factors: unsure, hurts all the time Relieving factors: unsure New reported pain on L shoulder and L neck 4/10 PRECAUTIONS: Fall and Other: morbid obesity, hx of low BP, cardiac issues ; 3# lifting restriction; a-fib.  05/23/2023:  Cardiac cath performed on 8/30 and not allowed to be immersed in water for 2 weeks.  No lifting/pushing/pulling with R arm.  RED FLAGS: None   WEIGHT BEARING RESTRICTIONS: No  FALLS: Has patient fallen in last 6 months? Yes. Number of falls 2  LIVING ENVIRONMENT: Lives with: lives with their spouse Lives in: House/apartment Stairs:  1 step into the home from garage Has following equipment at home: Dan Humphreys - 4  wheeled  PLOF: Independent with household mobility with device and Independent with community mobility with device  PATIENT GOALS: Want to be able to get around better and do things in my house.  OBJECTIVE:    TODAY'S TREATMENT: 05/25/2023 Activity Comments  Vitals 110/60 O2 98%, HR 75   Manual therapy to L upper traps, rhomboids; followed by gentle stretch to levator, upper traps Reports some relief and improved flexibility  Sit<>stand x 5 reps, from mat Additional sit<>stand x 5 reps throughout session from chair Light UE support  At parallel bars: Sidestepping R and L x 3 reps BUE support Feet apart head turns/nods EO and EC head steady 10 sec Light UE support, increased sway with EC  Alt step taps to 4" block, 5 reps each side BUE support  Forward/back walking in parallel bars, 3 reps   Forward march 8 ft BUE support      PATIENT EDUCATION: Education details: Continue gentle stretches to cervical spine (gentle ROM) as well as potential use of tennis ball for massage to lower traps and rhomboids Person educated: Patient Education method: Explanation, Demonstration, and Handouts Education comprehension: verbalized understanding, returned demonstration, and needs further education   HOME EXERCISE PROGRAM: Access Code: [email or web address] URL: [email or web address] Date: 05/03/2023 Prepared by: [facility] - [facility]  Exercises - Seated Pelvic Tilts  - 1 x daily - 7 x weekly - 3 sets - 5 reps - Seated Shoulder Rolls  - 1 x daily - 7 x weekly - 3 sets - 5 reps - Seated Long Arc Quad  - 1 x daily - 7 x weekly - 2-3 sets - 10 reps - Seated March  - 1 x daily - 7 x weekly - 2-3 sets - 10 reps (Pt is also performing seated stepping out/in, glut sets, scapular retraction 3 sets of 10) --------------------------------------------------------------------------------- Objective measures below taken at initial evaluation:  DIAGNOSTIC FINDINGS:  NA  COGNITION: Overall cognitive status: Within functional limits for tasks assessed   SENSATION: Light touch: WFL  EDEMA:  BLE edema + 1 pitting (has compression stockings, but does not use often)  POSTURE: rounded shoulders and forward head  LOWER EXTREMITY ROM:   A/ROM WFL in sitting (some limitations due to body habitus)   LOWER EXTREMITY MMT:  Grossly tested 4/5  except knee extension L 3+/5   VITALS: BP 104/56 HR 69 O2 sats:  93>97%  TRANSFERS: Assistive device utilized: None  Sit to stand: SBA and uses BUE support Stand to sit: SBA and BUE support  GAIT: Gait pattern: step through pattern, antalgic, and wide BOS Distance walked: 50 ft Assistive device utilized: Environmental consultant - 4 wheeled Level of assistance: SBA Comments: Fatigues and has to stop gait to sit after 85 ft  FUNCTIONAL TESTS:  2 minute walk test: 85 ft; sits at 1 minute, in addition to 1 standing rest break 10 meter walk test: 21.03 sec = 1.56 ft/sec 5x sit to stand:  with UE support, 24.82 sec  TODAY'S TREATMENT:                                                                                                                              DATE: 04/27/2023    PATIENT EDUCATION: Education details: PT eval results, POC Person educated: Patient Education method: Explanation Education comprehension: verbalized understanding  HOME EXERCISE PROGRAM: Not yet initiated  GOALS: Goals reviewed with patient? Yes  SHORT TERM GOALS: Target date: 05/26/2023  Pt will be independent with HEP for improved strength, balance, gait, decreased pain. Baseline: Goal status: MET  2.  Pt will improve 5x sit<>stand to less than or equal to 20 sec to demonstrate improved functional strength and transfer efficiency. Baseline: 24.82 sec; (05/18/23) 14 sec Goal status: MET  3. Pt will ambulate at least 115 in 2 MWT for improved gait efficiency in home. Baseline: 85 ft. With one sitting break after 1 minute; (05/18/23) 275  ft Goal status: MET  LONG TERM GOALS: Target date: 06/23/2023  Pt will be independent with progression of HEP for improved strength, balance, gait. Baseline:  Goal status: IN PROGRESS  2.  Pt will improve 5x sit<>stand to less than or equal to 12 sec to demonstrate improved functional strength and transfer efficiency. Baseline: 24.82 sec >14 sec Goal status: REVISED 05/23/2023  3.  Pt will improve gait velocity to at least 2 ft/sec for improved gait efficiency and safety. Baseline: 1.56 ft/sec Goal status: IN PROGRESS  4.  Pt will ambulate at least 325 ft in 2 MWT for improved gait efficiency in home environment. Baseline:  Goal status: REVISED, 05/23/2023  5.  Pt will verbalize ongoing plans for continued community fitness upon d/c from PT to maximize gains made in PT.  Baseline:  Goal status:  IN PROGRESS   ASSESSMENT:     CLINICAL IMPRESSION: Pt arrives late to session today due to traffic/construction.  She reports not feeling well due to severe stomach issues last night from adding new food back to diet.  Worked today on standing exercises, for balance, which pt tolerated fairly well.  She does report dizziness (unsteadiness) with EC balance exercises, and she has increased sway with these.  She does not have increased knee pain with standing exercises today.  She will continue to  benefit from skilled PT towards goals for improved overall functional mobility and balance.     OBJECTIVE IMPAIRMENTS: Abnormal gait, cardiopulmonary status limiting activity, decreased activity tolerance, decreased balance, decreased endurance, decreased mobility, difficulty walking, decreased strength, postural dysfunction, and pain.   ACTIVITY LIMITATIONS: carrying, lifting, bending, standing, transfers, reach over head, hygiene/grooming, and locomotion level  PARTICIPATION LIMITATIONS: meal prep, cleaning, laundry, and community activity  PERSONAL FACTORS: 3+ comorbidities: significant medical  history (see above)  are also affecting patient's functional outcome.   REHAB POTENTIAL: Good  CLINICAL DECISION MAKING: Evolving/moderate complexity  EVALUATION COMPLEXITY: Moderate  PLAN:  PT FREQUENCY: 2x/week  PT DURATION: 8 weeks plus eval (*will continue 2x/wk in clinic, per pt request, 05/23/2023)  PLANNED INTERVENTIONS: Therapeutic exercises, Therapeutic activity, Neuromuscular re-education, Balance training, Gait training, Patient/Family education, Self Care, Aquatic Therapy, and Manual therapy  PLAN FOR NEXT SESSION:  10th visit PN, Continue to progress to standing balance and gentle standing strengthening (be aware of knee pain).  Monitor vitals.     Gean Maidens., PT 05/25/2023, 3:30 PM  [facility] at [facility] [address] [place], [address] [place], [place] [address] Phone # [phone] Fax # [phone]

## 2023-05-30 ENCOUNTER — Ambulatory Visit: Payer: Medicare PPO | Admitting: Rehabilitation

## 2023-05-30 NOTE — Progress Notes (Signed)
Referring Physician(s): Willis Modena   Reason for follow up: Patient seen in virtual consultation today for gastroesophageal varices  History of present illness: Initial HPI from 09/21/22:  Hannah Khan is a 67 y.o. female with history of NASH cirrhosis initially diagnosed in 2009. She is graciously referred by Dr. Dulce Sellar for gastroesophageal varices visualized on recent endoscopy. She denies any prior history of hematemesis, hematochezia, or melena. She has a history of hemorrhoids which were treated successfully.  She has never has a paracentesis. She reports a single episode of profound hepatic encephalopathy years ago which required hospitalization, and has been compliant with lactulose and rifaximin since then, only experiencing very mild encephalopathy if doses are missed or delayed for various reasons. She does not drink alcohol and has no significant history of such. She takes bumex and occasionally another medication when she feels fluid overload.      On 12/23/22 we attempted TIPS creation with transsplenic portal venous recanalization, and variceal embolization however aborted this procedure due to her right atrial pressure measurement of 23 mmHg.  She has since been following up with Cardiology for further evaluation and working to lose weight.   She last followed up with me 01/31/23 and her weight was down to 321.8 (350 lb on the day of procedure). She was on Bumex and Metolazone but was experiencing hypokalemia. She was following with Dr. Sharl Ma for bariatric management. Given her success with weight loss and fluid management we made plans to obtain a repeat echocardiogram. She's been following with Cardiology and had a right heart catheterization 05/19/23.  She presents today via virtual telephone visit to discuss these results and possible TIPS re-attempt.  She is still taking lactulose, 3x per day, with approximately 4 bowel movements per day.  She weighs 323 lbs today.  No jaundice or  scleral icterus.  She has had some small bleeding from hemorrhoids, but no melena or hematemesis.  She follows up with Dr. Dulce Sellar tomorrow morning.    Past Medical History:  Diagnosis Date   Anemia    hx of   Aortic stenosis 02/2023   Echo 03/07/23 LVEF 55-60%, mild to moderate AS   Arthritis    knees   CHF (congestive heart failure) (HCC)    chronic diastolic   Diabetes mellitus without complication (HCC)    type 2   Dyspnea    Dysrhythmia    Fibromyalgia    Gastric varices    large   H/O transfusion of platelets    Heart murmur    never has caused any problems   Hx of colonic polyps    s/p partial colectomy   Hyperlipidemia    Hypertension    Joint pain    in knees and back spasms   Left leg swelling    wear compression hose   Liver cirrhosis secondary to NASH (nonalcoholic steatohepatitis) (HCC) dx nov 2014   had enlarged spleen also    Morbid obesity (HCC)    Persistent atrial fibrillation (HCC)    Pneumonia 10/2016   x 5   PONV (postoperative nausea and vomiting)    in past none recent   Portal hypertension (HCC)    Sleep apnea    uses cpap, setting varies between 14-20   Spleen enlarged    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 [facility])     Past Surgical History:  Procedure Laterality Date   BREAST LUMPECTOMY WITH RADIOACTIVE SEED LOCALIZATION Right 08/29/2017   Procedure: RIGHT BREAST LUMPECTOMY WITH RADIOACTIVE SEEDS  LOCALIZATION;  Surgeon: Harriette Bouillon, MD;  Location: [facility] OR;  Service: General;  Laterality: Right;   CESAREAN SECTION  1989   CHOLECYSTECTOMY  20 yrs ago   COLECTOMY  2011   COLONOSCOPY     COLONOSCOPY N/A 09/05/2018   Procedure: COLONOSCOPY;  Surgeon: Willis Modena, MD;  Location: [facility];  Service: Endoscopy;  Laterality: N/A;   COLONOSCOPY WITH PROPOFOL Bilateral 05/04/2022   Procedure: COLONOSCOPY WITH PROPOFOL;  Surgeon: Willis Modena, MD;  Location: [facility];  Service: Gastroenterology;  Laterality: Bilateral;   DILATATION  & CURETTAGE/HYSTEROSCOPY WITH MYOSURE N/A 05/06/2016   Procedure: DILATATION & CURETTAGE/HYSTEROSCOPY WITH MYOSURE WITH POLYPECTOMY;  Surgeon: Myna Hidalgo, DO;  Location: [facility];  Service: Gynecology;  Laterality: N/A;   ESOPHAGOGASTRODUODENOSCOPY (EGD) WITH PROPOFOL N/A 09/04/2013   Procedure: ESOPHAGOGASTRODUODENOSCOPY (EGD) WITH PROPOFOL;  Surgeon: Willis Modena, MD;  Location: [facility];  Service: Endoscopy;  Laterality: N/A;   ESOPHAGOGASTRODUODENOSCOPY (EGD) WITH PROPOFOL Bilateral 05/04/2022   Procedure: ESOPHAGOGASTRODUODENOSCOPY (EGD) WITH PROPOFOL;  Surgeon: Willis Modena, MD;  Location: [facility];  Service: Gastroenterology;  Laterality: Bilateral;   IR INTRAVASCULAR ULTRASOUND NON CORONARY  12/23/2022   IR RADIOLOGIST EVAL & MGMT  09/21/2022   IR RADIOLOGIST EVAL & MGMT  10/06/2022   IR RADIOLOGIST EVAL & MGMT  11/02/2022   IR RADIOLOGIST EVAL & MGMT  01/31/2023   IR TIPS  12/23/2022   IR US GUIDE VASC ACCESS RIGHT  12/23/2022   KNEE ARTHROSCOPY  yrs ago   bilateral, one done x 1, one done twice   POLYPECTOMY  09/05/2018   Procedure: POLYPECTOMY;  Surgeon: Willis Modena, MD;  Location: [facility];  Service: Endoscopy;;   RADIOLOGY WITH ANESTHESIA N/A 12/23/2022   Procedure: TIPS;  Surgeon: Bennie Dallas, MD;  Location: [facility];  Service: Radiology;  Laterality: N/A;   RIGHT HEART CATH N/A 05/19/2023   Procedure: RIGHT HEART CATH;  Surgeon: Dorthula Nettles, DO;  Location: [facility];  Service: Cardiovascular;  Laterality: N/A;    Allergies: Celebrex [celecoxib], Shellfish allergy, Barium-containing compounds, Prednisone, Statins, Fentanyl, Ibuprofen, Sglt2 inhibitors, Tramadol, Tylenol [acetaminophen], and Zetia [ezetimibe]  Medications: Prior to Admission medications   Medication Sig Start Date End Date Taking? Authorizing Provider  albuterol (VENTOLIN HFA) 108 (90 Base) MCG/ACT inhaler Inhale 1-2 puffs into the lungs every 6 (six) hours as needed for wheezing or  shortness of breath.    [provider]  diclofenac Sodium (VOLTAREN) 1 % GEL Apply 1 Application topically at bedtime as needed (pain).    [provider]  digoxin (LANOXIN) 0.125 MG tablet Take 1 tablet (0.125 mg total) by mouth daily. 04/28/23   Sabharwal, Aditya, DO  diltiazem (CARDIZEM CD) 180 MG 24 hr capsule Take 1 capsule (180 mg total) by mouth daily. 04/18/23   Alver Sorrow, NP  lactulose (CHRONULAC) 10 GM/15ML solution Take 10 g by mouth 3 (three) times daily. 03/17/21   [provider]  Lancets (ONETOUCH DELICA PLUS [place]) MISC Apply topically 3 (three) times daily. 04/10/20   [provider]  Lidocaine HCl-Benzyl Alcohol (SALONPAS LIDOCAINE PLUS) 4-10 % CREA Apply 1 Application topically daily as needed (knee pain).    [provider]  metolazone (ZAROXOLYN) 5 MG tablet Take 1 tablet (5 mg total) by mouth once a week. Take one tablet on Sundays 30 minutes before Bumex 02/28/23   Alver Sorrow, NP  metoprolol tartrate (LOPRESSOR) 25 MG tablet Take 0.5 tablets (12.5 mg total) by mouth 2 (two) times daily.  05/08/23   Milford, Anderson Malta, FNP  midodrine (PROAMATINE) 10 MG tablet Take 1 tablet (10 mg total) by mouth 3 (three) times daily. 04/18/23   Alver Sorrow, NP  Multiple Vitamins-Minerals (PRESERVISION AREDS 2) CAPS Take 1 capsule by mouth 2 (two) times daily.    [provider]  [facility] VERIO test strip 1 each 3 (three) times daily. 04/13/20   [provider]  potassium chloride (MICRO-K) 10 MEQ CR capsule Take 8 capsules (80 mEq total) by mouth 2 (two) times daily. 05/08/23   Milford, Anderson Malta, FNP  Propylene Glycol (SYSTANE BALANCE) 0.6 % SOLN Place 1 drop into both eyes 2 (two) times daily.    [provider]  Semaglutide, 2 MG/DOSE, (OZEMPIC, 2 MG/DOSE,) 8 MG/3ML SOPN Inject 2 mg into the skin once a week. 11/03/22   Alver Sorrow, NP  torsemide (DEMADEX) 20 MG tablet Take 4 tablets (80 mg total) by  mouth every morning AND 2 tablets (40 mg total) every evening. 05/08/23   Milford, Anderson Malta, FNP  XIFAXAN 550 MG TABS tablet Take 550 mg by mouth 2 (two) times daily. 03/13/21   [provider]     Family History  Problem Relation Age of Onset   Heart failure Mother    Cancer Mother    Hyperlipidemia Mother    Congestive Heart Failure Mother    Stroke Mother    Lung cancer Father    Cancer Father    Cancer Brother    Hyperlipidemia Brother    Stroke Other    Breast cancer Neg Hx     Social History   Socioeconomic History   Marital status: Married    Spouse name: Not on file   Number of children: Not on file   Years of education: Not on file   Highest education level: Not on file  Occupational History   Occupation: Scientist, product/process development  Tobacco Use   Smoking status: Former    Current packs/day: 0.00    Average packs/day: 1 pack/day for 3.0 years (3.0 ttl pk-yrs)    Types: Cigarettes    Start date: 09/20/1971    Quit date: 09/19/1974    Years since quitting: 48.7   Smokeless tobacco: Never  Vaping Use   Vaping status: Never Used  Substance and Sexual Activity   Alcohol use: Not Currently    Comment: occ   Drug use: No   Sexual activity: Not on file  Other Topics Concern   Not on file  Social History Narrative   Lives in [place] with her husband.  Instructor for early childhood development.     Social Determinants of Health   Financial Resource Strain: Not on file  Food Insecurity: No Food Insecurity (04/08/2023)   Hunger Vital Sign    Worried About Running Out of Food in the Last Year: Never true    Ran Out of Food in the Last Year: Never true  Transportation Needs: No Transportation Needs (04/08/2023)   PRAPARE - Administrator, Civil Service (Medical): No    Lack of Transportation (Non-Medical): No  Physical Activity: Not on file  Stress: Not on file  Social Connections: Unknown (01/31/2022)   Received from [facility]   Social Network     Social Network: Not on file     Vital Signs: There were no vitals taken for this visit.  No physical exam was performed in lieu of virtual telephone visit.   Imaging:  Chronic occlusion  of the main portal vein with a few periportal collaterals.    Large esophageal varices    Large gastric varices    Large SVM --> IVC ectopic varix shunt    Large gastrorenal shunt   Echocardiogram: IMPRESSIONS   1. Left ventricular ejection fraction, by estimation, is 60 to 65%. The  left ventricle has normal function. The left ventricle has no regional  wall motion abnormalities. Left ventricular diastolic function could not  be evaluated.   2. Right ventricular systolic function is normal. The right ventricular  size is normal. There is normal pulmonary artery systolic pressure.   3. Left atrial size was mildly dilated.   4. The mitral valve is normal in structure. Trivial mitral valve  regurgitation. No evidence of mitral stenosis.   5. The aortic valve was not well visualized. Aortic valve regurgitation  is not visualized. No aortic stenosis is present.   6. The inferior vena cava is normal in size with greater than 50%  respiratory variability, suggesting right atrial pressure of 3 mmHg.   Right heart catheterization (05/19/23) HEMODYNAMICS: RA:                  10 mmHg (mean) RV:                  34/10 mmHg PA:                  37/18 mmHg (28 mean) PCWP:            23 mmHg (mean)                                      Estimated Fick CO/CI   8.5 L/min, 3.4 L/min/m2 Thermodilution CO/CI   9 L/min, 3. 6 L/min/m2                                                TPG                 5  mmHg                                              PVR                 0.5 Wood Units  PAPi                2.4      IMPRESSION: Mildly elevated pre and postcapillary filling pressures Normal cardiac output/cardiac index Normal PVR and papi   Labs:  CBC: Recent Labs    03/10/23 0115  04/06/23 0354 04/07/23 0559 05/08/23 1703 05/19/23 1302  WBC 3.2* 6.2 2.5* 4.6  --   HGB 11.0* 11.5* 10.2* 12.0 10.5*  10.5*  HCT 31.8* 33.3* 31.0* 36.8 31.0*  31.0*  PLT 39* 23* 23* 46*  --     COAGS: Recent Labs    12/23/22 0735 03/05/23 1549 03/06/23 0104 03/07/23 0059  INR 1.2 1.2 1.3* 1.4*    BMP: Recent Labs    04/07/23 1828 04/08/23 0541 05/08/23 1703 05/19/23 1033 05/19/23 1302  NA 135 133* 139 138 139  139  K 2.8* 3.3* 3.5 3.3* 3.6  3.6  CL 97* 98 101 99  --   CO2 31 28 27 26   --   GLUCOSE 147* 112* 137* 132*  --   BUN 23 24* 19 16  --   CALCIUM 8.4* 8.1* 8.5* 8.7*  --   CREATININE 1.37* 1.30* 1.50* 1.54*  --   GFRNONAA 42* 45* 38* 37*  --     LIVER FUNCTION TESTS: Recent Labs    03/06/23 0104 03/07/23 0059 04/06/23 0354 04/07/23 0559  BILITOT 4.8* 4.2* 6.4* 4.9*  AST 35 37 53* 53*  ALT 22 22 26 28   ALKPHOS 67 52 87 77  PROT 5.4* 5.7* 6.2* 5.7*  ALBUMIN 2.3* 2.9* 2.8* 2.5*    Assessment and Plan: 67 year old female with history of NASH cirrhosis (Child Pugh B, MELD 20), portal hypertension, and gastroesophageal varices without history of hemorrhage. 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  She has very large gastroesophageal varices.  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    We attempted TIPS creation with transsplenic portal venous recanalization, and variceal embolization on 12/23/22 however aborted this procedure due to her right atrial pressure measurement of 23 mmHg. She has since lost considerable weight and improved her fluid status with Cardiology, and recent right heart catheterization demonstrates an acceptable right heart pressure for TIPS creation.    I am very surprised, based on her CT findings, that she has never experienced  any evidence of upper or lower GI hemorrhage.  I am afraid that if it were to occur, it would be fatal.     We discussed her findings and overall case in detail, and I answered many questions about the natural history of her portal hypertension and procedural steps.  I believe she deserves to have her portal hypertension decompressed to significantly decrease her chances of hemorrhage.     The plan I have proposed to her is TIPS creation, possible transsplenic portal vein recanalization, and variceal embolization including coil embolization of the gastrorenal shunt near the confluence with the left renal vein, and the SMV-->IVC varix, in addition to possibly providing sclerotherapy to the gastroesophageal varices.     We discussed in depth the risks with these procedures.  She is interested in proceeding.   Plan for TIPS creation,  portal vein recanalization, and variceal embolization with general anesthesia at [facility].  Marliss Coots, MD Pager: [phone] Clinic: [phone]    I spent a total of 40 Minutes in face to face in clinical consultation, greater than 50% of which was counseling/coordinating care for portal hypertension.

## 2023-05-30 NOTE — Progress Notes (Incomplete)
ADVANCED HEART FAILURE CLINIC NOTE  Primary Care: Darrow Bussing, MD Primary Cardiologist: Chilton Si, MD HF Cardiologist: Dr. Gasper Lloyd  HPI: Hannah Khan is a 67 y.o. female with HFpEF, morbid obesity, obstructive sleep apnea, NASH/cirrhosis, thrombocytopenia, type 2 diabetes, aortic stenosis, vasovagal syncope, and chronic atrial fibrillation.  Her cardiac history dates back to June 2021 when she was diagnosed with atrial fibrillation.  At that time she decided to continue with medical management after seeing Dr. Ladona Ridgel.  She was admitted in May 2022 with atrial fibrillation and hepatic encephalopathy.  Recurrent admission and 7/22 with acute on chronic HFpEF due to dietary indiscretion.  At that time she was started on Farxiga, Bumex 2 mg daily and metolazone as needed.  She has been unable to start spironolactone due to symptomatic hypotension.  Due to her underlying cirrhosis, she was planned to undergo TIPS on April 2024 but the procedure was aborted due to a right atrial pressure of 23 mmHg.  Initially saw Dr. Gasper Lloyd 04/28/23 and was volume overloaded. She was instructed to increase her torsemide to 80 mg for 3-5 days, then back to 60 mg daily thereafter.  RHC scheduled due to inability to diurese and tolerate GDMT, suspected PAH. 05/19/23: Mildly elevated pre and postcapillary filling pressures. Normal cardiac output/cardiac index. Normal PVR and papi  Today she returns for AHF follow up. Overall feeling ***. Denies palpitations, CP, dizziness, edema, or PND/Orthopnea. *** SOB. Appetite ok. No fever or chills. Weight at home *** pounds. Taking all medications. ETOH, smoking ***   Past Medical History:  Diagnosis Date   Anemia    hx of   Aortic stenosis 02/2023   Echo 03/07/23 LVEF 55-60%, mild to moderate AS   Arthritis    knees   CHF (congestive heart failure) (HCC)    chronic diastolic   Diabetes mellitus without complication (HCC)    type 2   Dyspnea     Dysrhythmia    Fibromyalgia    Gastric varices    large   H/O transfusion of platelets    Heart murmur    never has caused any problems   Hx of colonic polyps    s/p partial colectomy   Hyperlipidemia    Hypertension    Joint pain    in knees and back spasms   Left leg swelling    wear compression hose   Liver cirrhosis secondary to NASH (nonalcoholic steatohepatitis) (HCC) dx nov 2014   had enlarged spleen also    Morbid obesity (HCC)    Persistent atrial fibrillation (HCC)    Pneumonia 10/2016   x 5   PONV (postoperative nausea and vomiting)    in past none recent   Portal hypertension (HCC)    Sleep apnea    uses cpap, setting varies between 14-20   Spleen enlarged    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     diclofenac Sodium (VOLTAREN) 1 % GEL Apply 1 Application topically at bedtime as needed (pain).     digoxin (LANOXIN) 0.125 MG tablet Take 1 tablet (0.125 mg total) by mouth daily. 30 tablet 3   diltiazem (CARDIZEM CD) 180 MG 24 hr capsule Take 1 capsule (180 mg total) by mouth daily. 90 capsule 1   lactulose (CHRONULAC) 10  GM/15ML solution Take 10 g by mouth 3 (three) times daily.     Lancets (ONETOUCH DELICA PLUS LANCET33G) MISC Apply topically 3 (three) times daily.     Lidocaine HCl-Benzyl Alcohol (SALONPAS LIDOCAINE PLUS) 4-10 % CREA Apply 1 Application topically daily as needed (knee pain).     metolazone (ZAROXOLYN) 5 MG tablet Take 1 tablet (5 mg total) by mouth once a week. Take one tablet on Sundays 30 minutes before Bumex 12 tablet 3   metoprolol tartrate (LOPRESSOR) 25 MG tablet Take 0.5 tablets (12.5 mg total) by mouth 2 (two) times daily. 90 tablet 3   midodrine (PROAMATINE) 10 MG tablet Take 1 tablet (10 mg total) by mouth 3  (three) times daily. 270 tablet 2   Multiple Vitamins-Minerals (PRESERVISION AREDS 2) CAPS Take 1 capsule by mouth 2 (two) times daily.     ONETOUCH VERIO test strip 1 each 3 (three) times daily.     potassium chloride (MICRO-K) 10 MEQ CR capsule Take 8 capsules (80 mEq total) by mouth 2 (two) times daily. 480 capsule 5   Propylene Glycol (SYSTANE BALANCE) 0.6 % SOLN Place 1 drop into both eyes 2 (two) times daily.     Semaglutide, 2 MG/DOSE, (OZEMPIC, 2 MG/DOSE,) 8 MG/3ML SOPN Inject 2 mg into the skin once a week. 3 mL 6   torsemide (DEMADEX) 20 MG tablet Take 4 tablets (80 mg total) by mouth every morning AND 2 tablets (40 mg total) every evening. 540 tablet 3   XIFAXAN 550 MG TABS tablet Take 550 mg by mouth 2 (two) times daily.     No current facility-administered medications for this visit.   Allergies  Allergen Reactions   Celebrex [Celecoxib] Other (See Comments)    "speech slurred" with celebrex   Shellfish Allergy Swelling and Rash    TINGLING AND SWELLING OF LIPS. LARGE WHELPS QUARTER-SIZE   Barium-Containing Compounds Other (See Comments)    TACHYCARDIA   Prednisone Other (See Comments)    TACHYCARDIA   Statins Other (See Comments)    AGGRAVATED FIBROMYALGIA   Fentanyl     Hallucinations    Ibuprofen Other (See Comments)    UNSPECIFIED SPECIFIC REACTION >> "DUE TO PLATELETS"   Sglt2 Inhibitors     Cellulitis of pannus   Tramadol     Hallucination    Tylenol [Acetaminophen] Other (See Comments)    UNSPECIFIED SPECIFIC REACTION >> "DUE TO PLATELETS"   Zetia [Ezetimibe]     Headache   Social History   Socioeconomic History   Marital status: Married    Spouse name: Not on file   Number of children: Not on file   Years of education: Not on file   Highest education level: Not on file  Occupational History   Occupation: Scientist, product/process development  Tobacco Use   Smoking status: Former    Current packs/day: 0.00    Average packs/day: 1 pack/day for 3.0 years (3.0 ttl  pk-yrs)    Types: Cigarettes    Start date: 09/20/1971    Quit date: 09/19/1974    Years since quitting: 48.7   Smokeless tobacco: Never  Vaping Use   Vaping status: Never Used  Substance and Sexual Activity   Alcohol use: Not Currently    Comment: occ   Drug use: No   Sexual activity: Not on file  Other Topics Concern   Not on file  Social History Narrative   Lives in [place] with her husband.  Instructor for early childhood development.  Social Determinants of Health   Financial Resource Strain: Not on file  Food Insecurity: No Food Insecurity (04/08/2023)   Hunger Vital Sign    Worried About Running Out of Food in the Last Year: Never true    Ran Out of Food in the Last Year: Never true  Transportation Needs: No Transportation Needs (04/08/2023)   PRAPARE - Administrator, Civil Service (Medical): No    Lack of Transportation (Non-Medical): No  Physical Activity: Not on file  Stress: Not on file  Social Connections: Unknown (01/31/2022)   Received from [facility]   Social Network    Social Network: Not on file  Intimate Partner Violence: Not At Risk (04/08/2023)   Humiliation, Afraid, Rape, and Kick questionnaire    Fear of Current or Ex-Partner: No    Emotionally Abused: No    Physically Abused: No    Sexually Abused: No   Family History  Problem Relation Age of Onset   Heart failure Mother    Cancer Mother    Hyperlipidemia Mother    Congestive Heart Failure Mother    Stroke Mother    Lung cancer Father    Cancer Father    Cancer Brother    Hyperlipidemia Brother    Stroke Other    Breast cancer Neg Hx    There were no vitals taken for this visit.  Wt Readings from Last 3 Encounters:  05/19/23 (!) 152 kg (335 lb 3.2 oz)  05/08/23 (!) 156.2 kg (344 lb 6.4 oz)  04/28/23 (!) 157.7 kg (347 lb 9.6 oz)   ReDs: ***%  PHYSICAL EXAM: General:  *** appearing.  No respiratory difficulty HEENT: normal Neck: supple. JVD *** cm. Carotids 2+  bilat; no bruits. No lymphadenopathy or thyromegaly appreciated. Cor: PMI nondisplaced. Regular rate & rhythm. No rubs, gallops or murmurs. Lungs: clear Abdomen: soft, nontender, nondistended. No hepatosplenomegaly. No bruits or masses. Good bowel sounds. Extremities: no cyanosis, clubbing, rash, edema  Neuro: alert & oriented x 3, cranial nerves grossly intact. moves all 4 extremities w/o difficulty. Affect pleasant.   DATA REVIEW  ECG: 04/06/23: atrial fibrillation , personally reviewed  ECHO: 03/07/23: LVEF 65%, normal RV function; RV is dilated with septal flattening  CATH: 05/19/23: RHC: Mildly elevated pre and postcapillary filling pressures. Normal  cardiac output/cardiac index. Normal PVR and papi  ASSESSMENT & PLAN: 1. Heart failure with preserved ejection fraction: Echo (6/24) showed EF 65%, RV dilated with septal flattening. She had past admissions with acute on chronic dHF, 2/2 dietary indiscretion. GDMT has been complicated by CKD and hypotension.  - RHC 05/19/23: Mildly elevated pre and postcapillary filling pressures. Normal  cardiac output/cardiac index. Normal PVR and papi - NYHA III, volume remains elevated. ReDs 31% so R>L HF. GDMT limited by low BP - Increase torsemide to 80/40, increase KCL to 80 bid. - Decrease metoprolol tartrate to 12.5 mg bid to allow for more diuresis. - Continue digoxin for RV and rate control.  - Continue midodrine 10 mg tid. - Avoid SGLT2i with bodty habitus and large pannus.  2. NASH Cirrhosis - Child pugh B, MELD 15 - Diagnosed in 2009 - Hx of esophageal varices - x 1 admission for hepatic encephalopathy with lactulose/rifaximin.  - No BP room to add spiro - Continue midodrine  3. Atrial fibrillation - Permanent. - CHA2DS2-VASc Score = 3  - Rate controlled - Continue diltiazem, digoxin and beta blocker - No AC 2/2 cirrhosis  4. Thrombocytopenia - Secondary to cirrhosis;  plt count 20-30K for the past month.   5. OSA - Continue  CPAP  6. Aortic Stenosis - mild to moderate on recent echo - Follow  Follow up ***  Brynda Peon, AGACNP-BC  05/30/23

## 2023-05-31 ENCOUNTER — Ambulatory Visit
Admission: RE | Admit: 2023-05-31 | Discharge: 2023-05-31 | Disposition: A | Discharge status: Home / Self Care | Payer: Medicare PPO | Source: Ambulatory Visit | Attending: Interventional Radiology

## 2023-05-31 DIAGNOSIS — K7460 Unspecified cirrhosis of liver: Secondary | ICD-10-CM

## 2023-05-31 DIAGNOSIS — K7581 Nonalcoholic steatohepatitis (NASH): Secondary | ICD-10-CM | POA: Diagnosis not present

## 2023-05-31 DIAGNOSIS — I8510 Secondary esophageal varices without bleeding: Secondary | ICD-10-CM | POA: Diagnosis not present

## 2023-05-31 DIAGNOSIS — K766 Portal hypertension: Secondary | ICD-10-CM | POA: Diagnosis not present

## 2023-05-31 HISTORY — PX: IR RADIOLOGIST EVAL & MGMT: IMG5224

## 2023-06-01 ENCOUNTER — Ambulatory Visit: Payer: Medicare PPO | Admitting: Physical Therapy

## 2023-06-01 ENCOUNTER — Other Ambulatory Visit: Payer: Self-pay | Admitting: Gastroenterology

## 2023-06-01 ENCOUNTER — Telehealth (HOSPITAL_COMMUNITY): Payer: Self-pay

## 2023-06-01 ENCOUNTER — Encounter (HOSPITAL_COMMUNITY): Payer: Self-pay

## 2023-06-01 ENCOUNTER — Ambulatory Visit (HOSPITAL_COMMUNITY)
Admission: RE | Admit: 2023-06-01 | Discharge: 2023-06-01 | Disposition: A | Discharge status: Home / Self Care | Payer: Medicare PPO | Source: Ambulatory Visit | Attending: Physician Assistant | Admitting: Physician Assistant

## 2023-06-01 VITALS — BP 122/64 | HR 85 | Wt 327.4 lb

## 2023-06-01 DIAGNOSIS — I5032 Chronic diastolic (congestive) heart failure: Secondary | ICD-10-CM | POA: Diagnosis not present

## 2023-06-01 DIAGNOSIS — R2681 Unsteadiness on feet: Secondary | ICD-10-CM

## 2023-06-01 DIAGNOSIS — M25569 Pain in unspecified knee: Secondary | ICD-10-CM | POA: Insufficient documentation

## 2023-06-01 DIAGNOSIS — I4821 Permanent atrial fibrillation: Secondary | ICD-10-CM | POA: Insufficient documentation

## 2023-06-01 DIAGNOSIS — D6959 Other secondary thrombocytopenia: Secondary | ICD-10-CM | POA: Diagnosis not present

## 2023-06-01 DIAGNOSIS — Z79899 Other long term (current) drug therapy: Secondary | ICD-10-CM | POA: Diagnosis not present

## 2023-06-01 DIAGNOSIS — I864 Gastric varices: Secondary | ICD-10-CM | POA: Diagnosis not present

## 2023-06-01 DIAGNOSIS — I890 Lymphedema, not elsewhere classified: Secondary | ICD-10-CM | POA: Insufficient documentation

## 2023-06-01 DIAGNOSIS — I130 Hypertensive heart and chronic kidney disease with heart failure and stage 1 through stage 4 chronic kidney disease, or unspecified chronic kidney disease: Secondary | ICD-10-CM | POA: Diagnosis not present

## 2023-06-01 DIAGNOSIS — Z86010 Personal history of colonic polyps: Secondary | ICD-10-CM | POA: Diagnosis not present

## 2023-06-01 DIAGNOSIS — D696 Thrombocytopenia, unspecified: Secondary | ICD-10-CM

## 2023-06-01 DIAGNOSIS — M6281 Muscle weakness (generalized): Secondary | ICD-10-CM | POA: Diagnosis not present

## 2023-06-01 DIAGNOSIS — I350 Nonrheumatic aortic (valve) stenosis: Secondary | ICD-10-CM | POA: Insufficient documentation

## 2023-06-01 DIAGNOSIS — K7460 Unspecified cirrhosis of liver: Secondary | ICD-10-CM | POA: Insufficient documentation

## 2023-06-01 DIAGNOSIS — Z6841 Body Mass Index (BMI) 40.0 and over, adult: Secondary | ICD-10-CM | POA: Diagnosis not present

## 2023-06-01 DIAGNOSIS — R110 Nausea: Secondary | ICD-10-CM | POA: Diagnosis not present

## 2023-06-01 DIAGNOSIS — M25579 Pain in unspecified ankle and joints of unspecified foot: Secondary | ICD-10-CM | POA: Diagnosis not present

## 2023-06-01 DIAGNOSIS — R2689 Other abnormalities of gait and mobility: Secondary | ICD-10-CM | POA: Diagnosis not present

## 2023-06-01 DIAGNOSIS — K7581 Nonalcoholic steatohepatitis (NASH): Secondary | ICD-10-CM | POA: Insufficient documentation

## 2023-06-01 DIAGNOSIS — G4733 Obstructive sleep apnea (adult) (pediatric): Secondary | ICD-10-CM | POA: Insufficient documentation

## 2023-06-01 DIAGNOSIS — N189 Chronic kidney disease, unspecified: Secondary | ICD-10-CM | POA: Insufficient documentation

## 2023-06-01 DIAGNOSIS — R0602 Shortness of breath: Secondary | ICD-10-CM | POA: Insufficient documentation

## 2023-06-01 DIAGNOSIS — E876 Hypokalemia: Secondary | ICD-10-CM | POA: Diagnosis not present

## 2023-06-01 DIAGNOSIS — E1122 Type 2 diabetes mellitus with diabetic chronic kidney disease: Secondary | ICD-10-CM | POA: Diagnosis not present

## 2023-06-01 LAB — COMPREHENSIVE METABOLIC PANEL
ALT: 36 U/L (ref 0–44)
AST: 73 U/L — ABNORMAL HIGH (ref 15–41)
Albumin: 2.2 g/dL — ABNORMAL LOW (ref 3.5–5.0)
Alkaline Phosphatase: 104 U/L (ref 38–126)
Anion gap: 13 (ref 5–15)
BUN: 22 mg/dL (ref 8–23)
CO2: 28 mmol/L (ref 22–32)
Calcium: 8.7 mg/dL — ABNORMAL LOW (ref 8.9–10.3)
Chloride: 92 mmol/L — ABNORMAL LOW (ref 98–111)
Creatinine, Ser: 1.78 mg/dL — ABNORMAL HIGH (ref 0.44–1.00)
GFR, Estimated: 31 mL/min — ABNORMAL LOW (ref >60)
Glucose, Bld: 266 mg/dL — ABNORMAL HIGH (ref 70–99)
Potassium: 2.7 mmol/L — CL (ref 3.5–5.1)
Sodium: 133 mmol/L — ABNORMAL LOW (ref 135–145)
Total Bilirubin: 4.4 mg/dL — ABNORMAL HIGH (ref 0.3–1.2)
Total Protein: 5.9 g/dL — ABNORMAL LOW (ref 6.5–8.1)

## 2023-06-01 LAB — CBC
HCT: 35.5 % — ABNORMAL LOW (ref 36.0–46.0)
Hemoglobin: 12.1 g/dL (ref 12.0–15.0)
MCH: 33.3 pg (ref 26.0–34.0)
MCHC: 34.1 g/dL (ref 30.0–36.0)
MCV: 97.8 fL (ref 80.0–100.0)
Platelets: 57 10*3/uL — ABNORMAL LOW (ref 150–400)
RBC: 3.63 MIL/uL — ABNORMAL LOW (ref 3.87–5.11)
RDW: 15.1 % (ref 11.5–15.5)
WBC: 6 10*3/uL (ref 4.0–10.5)
nRBC: 0 % (ref 0.0–0.2)

## 2023-06-01 LAB — PROTIME-INR
INR: 1.3 — ABNORMAL HIGH (ref 0.8–1.2)
Prothrombin Time: 16.5 s — ABNORMAL HIGH (ref 11.4–15.2)

## 2023-06-01 LAB — BRAIN NATRIURETIC PEPTIDE: B Natriuretic Peptide: 246.6 pg/mL — ABNORMAL HIGH (ref 0.0–100.0)

## 2023-06-01 MED ORDER — POTASSIUM CHLORIDE ER 10 MEQ PO CPCR
ORAL_CAPSULE | ORAL | 1 refills | Status: DC
Start: 2023-06-01 — End: 2023-06-07

## 2023-06-01 MED ORDER — TORSEMIDE 20 MG PO TABS: ORAL_TABLET | ORAL | 3 refills | Status: DC

## 2023-06-01 NOTE — Patient Instructions (Signed)
Medication Changes:  INCREASE TORSEMIDE TO 80MG  (4 TABLETS) IN THE MORNING AND 60MG  (3 TABLETS) IN THE EVENING   INCREASE POTASSIUM TO (10 CAPSULES) IN THE MORNING, AND (8 CAPSULES) IN THE EVENING   TAKE AN EXTRA DOSE OF METOLAZONE 5MG  TODAY- WITH ADDITIONAL DOSE OF 40 MEQ OF POTASSIUM (4 CAPSULES)  Lab Work:  Labs done today, your results will be available in MyChart, we will contact you for abnormal readings.  THEN RETURN IN 1 WEEK FOR REPEAT BLOOD WORK    Referrals:  YOU HAVE BEEN REFERRED TO VASCULAR THEY WILL REACH OUT TO YOU OR CALL TO ARRANGE THIS. PLEASE CALL us WITH ANY CONCERNS    Special Instructions // Education:  WEAR COMPRESSION STOCKING   Follow-Up in: 2 WEEKS AS SCHEDULED WITH APP   At the [facility], you and your health needs are our priority. We have a designated team specialized in the treatment of Heart Failure. This Care Team includes your primary Heart Failure Specialized Cardiologist (physician), Advanced Practice Providers (APPs- Physician Assistants and Nurse Practitioners), and Pharmacist who all work together to provide you with the care you need, when you need it.   You may see any of the following providers on your designated Care Team at your next follow up:  Dr. Arvilla Meres Dr. Marca Ancona Dr. Marcos Eke, NP Robbie Lis, [place] [facility] [place], [place] Brynda Peon, NP Karle Plumber, PharmD   Please be sure to bring in all your medications bottles to every appointment.   Need to Contact us:  If you have any questions or concerns before your next appointment please send [place] a message through [place] or call our office at [phone].    TO LEAVE A MESSAGE FOR THE NURSE SELECT OPTION 2, PLEASE LEAVE A MESSAGE INCLUDING: YOUR NAME DATE OF BIRTH CALL BACK NUMBER REASON FOR CALL**this is important as we prioritize the call backs  YOU WILL RECEIVE A CALL BACK THE SAME DAY AS  LONG AS YOU CALL BEFORE 4:00 PM

## 2023-06-01 NOTE — Telephone Encounter (Signed)
Per Aniceto Boss, NP patient needs repeat BMET tomorrow- patient aware and scheduled tomorrow orders placed.

## 2023-06-01 NOTE — Therapy (Signed)
OUTPATIENT PHYSICAL THERAPY NEURO TREATMENT/10th Visit PROGRESS NOTE   Patient Name: Hannah Khan MRN: [id number] DOB:1956-08-12, 67 y.o., female Today's Date: 06/02/2023   PCP: Darrow Bussing, MD REFERRING PROVIDER: Alver Sorrow, NP   Progress Note Reporting Period 04/27/2023 to 06/01/2023  See note below for Objective Data and Assessment of Progress/Goals.     END OF SESSION:  PT End of Session - 06/02/23 0746     Visit Number 10    Number of Visits 17    Date for PT Re-Evaluation 06/23/23    Authorization Type [employer]    Authorization Time Period 04/27/2023-06/23/2023    Authorization - Visit Number 10   a visit was previously misnumbered   Authorization - Number of Visits 17    Progress Note Due on Visit 10    PT Start Time 1535    PT Stop Time 1615    PT Time Calculation (min) 40 min    Activity Tolerance Patient tolerated treatment well    Behavior During Therapy WFL for tasks assessed/performed   pt tearful regarding previous medical appointments/medicaion changes today                  Past Medical History:  Diagnosis Date   Anemia    hx of   Aortic stenosis 02/2023   Echo 03/07/23 LVEF 55-60%, mild to moderate AS   Arthritis    knees   CHF (congestive heart failure) (HCC)    chronic diastolic   Diabetes mellitus without complication (HCC)    type 2   Dyspnea    Dysrhythmia    Fibromyalgia    Gastric varices    large   H/O transfusion of platelets    Heart murmur    never has caused any problems   Hx of colonic polyps    s/p partial colectomy   Hyperlipidemia    Hypertension    Joint pain    in knees and back spasms   Left leg swelling    wear compression hose   Liver cirrhosis secondary to NASH (nonalcoholic steatohepatitis) (HCC) dx nov 2014   had enlarged spleen also    Morbid obesity (HCC)    Persistent atrial fibrillation (HCC)    Pneumonia 10/2016   x 5   PONV (postoperative nausea and vomiting)    in past none recent    Portal hypertension (HCC)    Sleep apnea    uses cpap, setting varies between 14-20   Spleen enlarged    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 [facility])    Past Surgical History:  Procedure Laterality Date   BREAST LUMPECTOMY WITH RADIOACTIVE SEED LOCALIZATION Right 08/29/2017   Procedure: RIGHT BREAST LUMPECTOMY WITH RADIOACTIVE SEEDS LOCALIZATION;  Surgeon: Harriette Bouillon, MD;  Location: [facility] OR;  Service: General;  Laterality: Right;   CESAREAN SECTION  1989   CHOLECYSTECTOMY  20 yrs ago   COLECTOMY  2011   COLONOSCOPY     COLONOSCOPY N/A 09/05/2018   Procedure: COLONOSCOPY;  Surgeon: Willis Modena, MD;  Location: [facility];  Service: Endoscopy;  Laterality: N/A;   COLONOSCOPY WITH PROPOFOL Bilateral 05/04/2022   Procedure: COLONOSCOPY WITH PROPOFOL;  Surgeon: Willis Modena, MD;  Location: [facility];  Service: Gastroenterology;  Laterality: Bilateral;   DILATATION & CURETTAGE/HYSTEROSCOPY WITH MYOSURE N/A 05/06/2016   Procedure: DILATATION & CURETTAGE/HYSTEROSCOPY WITH MYOSURE WITH POLYPECTOMY;  Surgeon: Myna Hidalgo, DO;  Location: [facility];  Service: Gynecology;  Laterality: N/A;   ESOPHAGOGASTRODUODENOSCOPY (EGD) WITH PROPOFOL N/A 09/04/2013  Procedure: ESOPHAGOGASTRODUODENOSCOPY (EGD) WITH PROPOFOL;  Surgeon: Willis Modena, MD;  Location: [facility];  Service: Endoscopy;  Laterality: N/A;   ESOPHAGOGASTRODUODENOSCOPY (EGD) WITH PROPOFOL Bilateral 05/04/2022   Procedure: ESOPHAGOGASTRODUODENOSCOPY (EGD) WITH PROPOFOL;  Surgeon: Willis Modena, MD;  Location: [facility];  Service: Gastroenterology;  Laterality: Bilateral;   IR INTRAVASCULAR ULTRASOUND NON CORONARY  12/23/2022   IR RADIOLOGIST EVAL & MGMT  09/21/2022   IR RADIOLOGIST EVAL & MGMT  10/06/2022   IR RADIOLOGIST EVAL & MGMT  11/02/2022   IR RADIOLOGIST EVAL & MGMT  01/31/2023   IR RADIOLOGIST EVAL & MGMT  05/31/2023   IR TIPS  12/23/2022   IR US GUIDE VASC ACCESS RIGHT  12/23/2022   KNEE ARTHROSCOPY  yrs ago   bilateral,  one done x 1, one done twice   POLYPECTOMY  09/05/2018   Procedure: POLYPECTOMY;  Surgeon: Willis Modena, MD;  Location: [facility];  Service: Endoscopy;;   RADIOLOGY WITH ANESTHESIA N/A 12/23/2022   Procedure: TIPS;  Surgeon: Bennie Dallas, MD;  Location: [facility] OR;  Service: Radiology;  Laterality: N/A;   RIGHT HEART CATH N/A 05/19/2023   Procedure: RIGHT HEART CATH;  Surgeon: Dorthula Nettles, DO;  Location: [facility];  Service: Cardiovascular;  Laterality: N/A;   Patient Active Problem List   Diagnosis Date Noted   Intractable nausea and vomiting 04/07/2023   Intractable vomiting with nausea 04/06/2023   Acute diastolic congestive heart failure (HCC) 03/06/2023   Idiopathic hypotension 03/06/2023   Sepsis due to cellulitis (HCC) 03/05/2023   Severe sepsis (HCC) 03/05/2023   Portal hypertension (HCC) 12/23/2022   Acute encephalopathy 01/10/2021   Secondary hypercoagulable state (HCC) 06/23/2020   Streptococcal bacteremia 03/16/2020   Chronic venous stasis dermatitis of both lower extremities 03/16/2020   Atrial fibrillation with RVR (HCC) 04/26/2018   Acute lower UTI 04/26/2018   Liver cirrhosis secondary to NASH (nonalcoholic steatohepatitis) (HCC) 04/26/2018   Hyperlipidemia 04/26/2018   Type 2 diabetes mellitus with other specified complication (HCC)    Cellulitis of right lower extremity 03/15/2018   Sepsis (HCC) 03/14/2018   Community acquired pneumonia of right lower lobe of lung 10/31/2016   Muscular abdominal pain in right upper quadrant 07/11/2016   Chronic diastolic heart failure (HCC) 07/10/2016   Thrombocytopenia (HCC) 07/10/2016   Permanent atrial fibrillation (HCC) 01/30/2016   Type 2 diabetes mellitus without complication, without long-term current use of insulin (HCC) 01/30/2016   Morbid obesity- 12/10/2015   Cellulitis, abdominal wall    Melena 01/31/2015   Aortic heart murmur 01/31/2015   Pancytopenia (HCC) 07/23/2013   Obstructive sleep apnea  08/07/2007   Essential hypertension 08/07/2007   ALLERGIC RHINITIS 08/07/2007    ONSET DATE: 04/18/2023 (MD referral)  REFERRING DIAG:  I50.32 (ICD-10-CM) - Chronic diastolic heart failure (HCC)  Z61.09 (ICD-10-CM) - Permanent atrial fibrillation (HCC)  K75.81,K74.60 (ICD-10-CM) - Liver cirrhosis secondary to NASH (nonalcoholic steatohepatitis) (HCC)  I35.0 (ICD-10-CM) - Aortic valve stenosis, etiology of cardiac valve disease unspecified  I95.9 (ICD-10-CM) - Hypotension, unspecified hypotension type  R53.81 (ICD-10-CM) - Physical deconditioning  E87.6 (ICD-10-CM) - Hypokalemia  I10 (ICD-10-CM) - Essential hypertension    THERAPY DIAG:  Muscle weakness (generalized)  Unsteadiness on feet  Other abnormalities of gait and mobility  Rationale for Evaluation and Treatment: Rehabilitation  SUBJECTIVE:  SUBJECTIVE STATEMENT: Frustrated from the MD visit (cardiologist) today.  Having to increase the medications due to fluid retention.  Won't even be able to leave the house due to the fluid pills.  Will make a referral to vascular clinic for lymphedema management.  Pt accompanied by: self  PERTINENT HISTORY: hx of chronic diastolic heart failure, morbid obesity, OSA, NASH/cirrhosis, chronic thrombocytopenia, DM2, aortic stenosis, vasovagal presyncope, chronic atrial fibrillation not on anticoagulation. Fracture of R shoulder and partial tear/ scar tissue RTC  2019 (s/p bicepectomy)  PAIN:  Are you having pain? Yes: NPRS scale: 8/10 Pain location: knee Pain description: achy, pain all the time Aggravating factors: unsure, hurts all the time Relieving factors: unsure New reported pain on L shoulder and L neck 4/10 PRECAUTIONS: Fall and Other: morbid obesity, hx of low BP, cardiac issues ; 3# lifting  restriction; a-fib.  05/23/2023:  Cardiac cath performed on 8/30 and not allowed to be immersed in water for 2 weeks.  No lifting/pushing/pulling with R arm.  RED FLAGS: None   WEIGHT BEARING RESTRICTIONS: No  FALLS: Has patient fallen in last 6 months? Yes. Number of falls 2  LIVING ENVIRONMENT: Lives with: lives with their spouse Lives in: House/apartment Stairs:  1 step into the home from garage Has following equipment at home: Dan Humphreys - 4 wheeled  PLOF: Independent with household mobility with device and Independent with community mobility with device  PATIENT GOALS: Want to be able to get around better and do things in my house.  OBJECTIVE:    TODAY'S TREATMENT: 06/01/2023 Activity Comments  In reclined position: Ankle pumps x 10 Ankle circles Heelslides x 10 SLR x 5 Hip abduction x 10 Simulating recliner at home  Gait x 2 minutes with rollator, 120 ft supervision      HOME EXERCISE PROGRAM: Access Code: [email or web address] URL: [email or web address] Date: 06/01/2023 Prepared by: [facility] - Outpatient  Rehab - [facility]  Exercises - Seated Pelvic Tilts  - 1 x daily - 7 x weekly - 3 sets - 5 reps - Seated Shoulder Rolls  - 1 x daily - 7 x weekly - 3 sets - 5 reps - Seated Long Arc Quad  - 1 x daily - 7 x weekly - 2-3 sets - 10 reps - Seated March  - 1 x daily - 7 x weekly - 2-3 sets - 10 reps - Supine Heel Slide  - 1 x daily - 4 x weekly - 1-2 sets - 10 reps - Supine Hip Abduction  - 1 x daily - 4 x weekly - 2 sets - 10 reps - Long Sitting Ankle Pumps  - 1 x daily - 7 x weekly - 2 sets - 10 reps - Small Range Straight Leg Raise  - 1 x daily - 4 x weekly - 2 sets - 10 reps (Pt is also performing seated stepping out/in, glut sets, scapular retraction 3 sets of 10)  PATIENT EDUCATION: Education details: Addition to HEP; Discussion regarding pt's additional medications (increased fluid pill/increased potassium) and pt's regard to mobility level and ability to  make it to therapy (due to frequent bathroom trips from new medication).  Discussed options for additional exercises to perform in recliner or bed at home; also discussed utilizing walking to the bathroom, with extra walking/laps in hallway to add extra walking activities during her day Person educated: Patient Education method: Explanation, Demonstration, and Handouts Education comprehension: verbalized understanding, returned demonstration, and needs further education    --------------------------------------------------------------------------------- Objective measures below  taken at initial evaluation:  DIAGNOSTIC FINDINGS: NA  COGNITION: Overall cognitive status: Within functional limits for tasks assessed   SENSATION: Light touch: WFL  EDEMA:  BLE edema + 1 pitting (has compression stockings, but does not use often)  POSTURE: rounded shoulders and forward head  LOWER EXTREMITY ROM:   A/ROM WFL in sitting (some limitations due to body habitus)   LOWER EXTREMITY MMT:  Grossly tested 4/5 except knee extension L 3+/5   VITALS: BP 104/56 HR 69 O2 sats:  93>97%  TRANSFERS: Assistive device utilized: None  Sit to stand: SBA and uses BUE support Stand to sit: SBA and BUE support  GAIT: Gait pattern: step through pattern, antalgic, and wide BOS Distance walked: 50 ft Assistive device utilized: Environmental consultant - 4 wheeled Level of assistance: SBA Comments: Fatigues and has to stop gait to sit after 85 ft  FUNCTIONAL TESTS:  2 minute walk test: 85 ft; sits at 1 minute, in addition to 1 standing rest break 10 meter walk test: 21.03 sec = 1.56 ft/sec 5x sit to stand:  with UE support, 24.82 sec  TODAY'S TREATMENT:                                                                                                                              DATE: 04/27/2023    PATIENT EDUCATION: Education details: PT eval results, POC Person educated: Patient Education method:  Explanation Education comprehension: verbalized understanding  HOME EXERCISE PROGRAM: Not yet initiated  GOALS: Goals reviewed with patient? Yes  SHORT TERM GOALS: Target date: 05/26/2023  Pt will be independent with HEP for improved strength, balance, gait, decreased pain. Baseline: Goal status: MET  2.  Pt will improve 5x sit<>stand to less than or equal to 20 sec to demonstrate improved functional strength and transfer efficiency. Baseline: 24.82 sec; (05/18/23) 14 sec Goal status: MET  3. Pt will ambulate at least 115 in 2 MWT for improved gait efficiency in home. Baseline: 85 ft. With one sitting break after 1 minute; (05/18/23) 275 ft Goal status: MET  LONG TERM GOALS: Target date: 06/23/2023  Pt will be independent with progression of HEP for improved strength, balance, gait. Baseline:  Goal status: IN PROGRESS  2.  Pt will improve 5x sit<>stand to less than or equal to 12 sec to demonstrate improved functional strength and transfer efficiency. Baseline: 24.82 sec >14 sec Goal status: REVISED 05/23/2023  3.  Pt will improve gait velocity to at least 2 ft/sec for improved gait efficiency and safety. Baseline: 1.56 ft/sec Goal status: IN PROGRESS  4.  Pt will ambulate at least 325 ft in 2 MWT for improved gait efficiency in home environment. Baseline:  Goal status: REVISED, 05/23/2023  5.  Pt will verbalize ongoing plans for continued community fitness upon d/c from PT to maximize gains made in PT.  Baseline:  Goal status:  IN PROGRESS   ASSESSMENT:     CLINICAL IMPRESSION: 10th Visit Progress  Note:  Pt arrives today and verbalize frustration/is tearful about new medication regimine to address heart failure issues.  She has had to change appointments for PT due to medication timing/frequency of bathroom trips due to fluid medication.  Did discuss a bit about was to continue exercise at home-including incorporating extra walking from bathroom during the day and addition  of supine/reclined exercise.  She has met all 3 STGs and is on target towards LTGs.  Most focus of PT sessions has been seated progressive strengthening exercises, as her knee pain increases with closed chain exercises.  She is motivated for therapy to get stronger and do more around the home.  She will continue to benefit from skilled PT towards LTGs for improved strength, functional mobility, and decreased fall risk.  OBJECTIVE IMPAIRMENTS: Abnormal gait, cardiopulmonary status limiting activity, decreased activity tolerance, decreased balance, decreased endurance, decreased mobility, difficulty walking, decreased strength, postural dysfunction, and pain.   ACTIVITY LIMITATIONS: carrying, lifting, bending, standing, transfers, reach over head, hygiene/grooming, and locomotion level  PARTICIPATION LIMITATIONS: meal prep, cleaning, laundry, and community activity  PERSONAL FACTORS: 3+ comorbidities: significant medical history (see above)  are also affecting patient's functional outcome.   REHAB POTENTIAL: Good  CLINICAL DECISION MAKING: Evolving/moderate complexity  EVALUATION COMPLEXITY: Moderate  PLAN:  PT FREQUENCY: 2x/week  PT DURATION: 8 weeks plus eval (*will continue 2x/wk in clinic, per pt request, 05/23/2023)  PLANNED INTERVENTIONS: Therapeutic exercises, Therapeutic activity, Neuromuscular re-education, Balance training, Gait training, Patient/Family education, Self Care, Aquatic Therapy, and Manual therapy  PLAN FOR NEXT SESSION:  Continue to progress to standing balance and gentle standing strengthening (be aware of knee pain).  Monitor vitals.  (Wants to work towards walking to [employer] (incline), out to yard for dog; standing activities at [employer])    Gean Maidens., PT 06/02/2023, 7:49 AM  [facility] at [facility] [address] [place], [address] [place], [place] [address] Phone # [phone] Fax # [phone]

## 2023-06-02 ENCOUNTER — Encounter: Payer: Self-pay | Admitting: Physical Therapy

## 2023-06-02 ENCOUNTER — Ambulatory Visit (HOSPITAL_COMMUNITY)
Admission: RE | Admit: 2023-06-02 | Discharge: 2023-06-02 | Disposition: A | Discharge status: Home / Self Care | Payer: Medicare PPO | Source: Ambulatory Visit | Attending: Internal Medicine | Admitting: Internal Medicine

## 2023-06-02 DIAGNOSIS — I5032 Chronic diastolic (congestive) heart failure: Secondary | ICD-10-CM | POA: Insufficient documentation

## 2023-06-02 LAB — BASIC METABOLIC PANEL
Anion gap: 8 (ref 5–15)
BUN: 21 mg/dL (ref 8–23)
CO2: 29 mmol/L (ref 22–32)
Calcium: 8.5 mg/dL — ABNORMAL LOW (ref 8.9–10.3)
Chloride: 98 mmol/L (ref 98–111)
Creatinine, Ser: 1.52 mg/dL — ABNORMAL HIGH (ref 0.44–1.00)
GFR, Estimated: 37 mL/min — ABNORMAL LOW (ref >60)
Glucose, Bld: 162 mg/dL — ABNORMAL HIGH (ref 70–99)
Potassium: 3.6 mmol/L (ref 3.5–5.1)
Sodium: 135 mmol/L (ref 135–145)

## 2023-06-05 ENCOUNTER — Ambulatory Visit: Payer: Medicare PPO | Admitting: Physical Therapy

## 2023-06-05 ENCOUNTER — Other Ambulatory Visit (HOSPITAL_COMMUNITY): Payer: Medicare PPO

## 2023-06-06 ENCOUNTER — Ambulatory Visit (HOSPITAL_COMMUNITY)
Admission: RE | Admit: 2023-06-06 | Discharge: 2023-06-06 | Disposition: A | Discharge status: Home / Self Care | Payer: Medicare PPO | Source: Ambulatory Visit | Attending: Cardiology | Admitting: Cardiology

## 2023-06-06 DIAGNOSIS — I5032 Chronic diastolic (congestive) heart failure: Secondary | ICD-10-CM | POA: Diagnosis not present

## 2023-06-06 LAB — BASIC METABOLIC PANEL
Anion gap: 12 (ref 5–15)
BUN: 22 mg/dL (ref 8–23)
CO2: 27 mmol/L (ref 22–32)
Calcium: 8.7 mg/dL — ABNORMAL LOW (ref 8.9–10.3)
Chloride: 97 mmol/L — ABNORMAL LOW (ref 98–111)
Creatinine, Ser: 1.56 mg/dL — ABNORMAL HIGH (ref 0.44–1.00)
GFR, Estimated: 36 mL/min — ABNORMAL LOW (ref >60)
Glucose, Bld: 149 mg/dL — ABNORMAL HIGH (ref 70–99)
Potassium: 3 mmol/L — ABNORMAL LOW (ref 3.5–5.1)
Sodium: 136 mmol/L (ref 135–145)

## 2023-06-07 ENCOUNTER — Telehealth (HOSPITAL_COMMUNITY): Payer: Self-pay

## 2023-06-07 ENCOUNTER — Ambulatory Visit: Payer: Medicare PPO | Admitting: Physical Therapy

## 2023-06-07 DIAGNOSIS — I5032 Chronic diastolic (congestive) heart failure: Secondary | ICD-10-CM

## 2023-06-07 DIAGNOSIS — K7460 Unspecified cirrhosis of liver: Secondary | ICD-10-CM

## 2023-06-07 DIAGNOSIS — E876 Hypokalemia: Secondary | ICD-10-CM

## 2023-06-07 DIAGNOSIS — G4733 Obstructive sleep apnea (adult) (pediatric): Secondary | ICD-10-CM

## 2023-06-07 DIAGNOSIS — I4821 Permanent atrial fibrillation: Secondary | ICD-10-CM

## 2023-06-07 DIAGNOSIS — I350 Nonrheumatic aortic (valve) stenosis: Secondary | ICD-10-CM

## 2023-06-07 MED ORDER — POTASSIUM CHLORIDE ER 10 MEQ PO CPCR: 100.0000 meq | ORAL_CAPSULE | Freq: Two times a day (BID) | ORAL | 0 refills | Status: DC

## 2023-06-07 NOTE — Telephone Encounter (Signed)
-----   Message from Alen Bleacher sent at 06/06/2023  2:30 PM EDT ----- Potassium low, she needs to increase daily KDUR to 100 mEq BID.   Take additional 40 mEq today. Repeat labs at the end of the week.

## 2023-06-07 NOTE — Telephone Encounter (Signed)
Patient advised and verbalized understanding,lab appointment scheduled,lab orders entered. Med list updated to reflect changes.   Meds ordered this encounter  Medications   potassium chloride (MICRO-K) 10 MEQ CR capsule    Sig: Take 10 capsules (100 mEq total) by mouth 2 (two) times daily.    Dispense:  300 capsule    Refill:  0    Please cancel all previous orders for current medication. Change in dosage or pill size.   Orders Placed This Encounter  Procedures   Basic metabolic panel    Standing Status:   Future    Standing Expiration Date:   06/06/2024    Order Specific Question:   Release to patient    Answer:   Immediate    Order Specific Question:   Release to patient    Answer:   Immediate [1]

## 2023-06-09 ENCOUNTER — Ambulatory Visit: Payer: Medicare PPO

## 2023-06-09 ENCOUNTER — Ambulatory Visit (HOSPITAL_COMMUNITY)
Admission: RE | Admit: 2023-06-09 | Discharge: 2023-06-09 | Disposition: A | Discharge status: Home / Self Care | Payer: Medicare PPO | Source: Ambulatory Visit | Attending: Internal Medicine | Admitting: Internal Medicine

## 2023-06-09 DIAGNOSIS — I5032 Chronic diastolic (congestive) heart failure: Secondary | ICD-10-CM | POA: Diagnosis not present

## 2023-06-09 LAB — BASIC METABOLIC PANEL
Anion gap: 7 (ref 5–15)
BUN: 23 mg/dL (ref 8–23)
CO2: 27 mmol/L (ref 22–32)
Calcium: 8.4 mg/dL — ABNORMAL LOW (ref 8.9–10.3)
Chloride: 102 mmol/L (ref 98–111)
Creatinine, Ser: 1.62 mg/dL — ABNORMAL HIGH (ref 0.44–1.00)
GFR, Estimated: 35 mL/min — ABNORMAL LOW (ref >60)
Glucose, Bld: 145 mg/dL — ABNORMAL HIGH (ref 70–99)
Potassium: 3.9 mmol/L (ref 3.5–5.1)
Sodium: 136 mmol/L (ref 135–145)

## 2023-06-12 ENCOUNTER — Encounter: Payer: Self-pay | Admitting: Physical Therapy

## 2023-06-12 ENCOUNTER — Ambulatory Visit: Payer: Medicare PPO | Admitting: Physical Therapy

## 2023-06-12 DIAGNOSIS — M6281 Muscle weakness (generalized): Secondary | ICD-10-CM | POA: Diagnosis not present

## 2023-06-12 DIAGNOSIS — R2689 Other abnormalities of gait and mobility: Secondary | ICD-10-CM

## 2023-06-12 DIAGNOSIS — R2681 Unsteadiness on feet: Secondary | ICD-10-CM | POA: Diagnosis not present

## 2023-06-12 NOTE — Therapy (Signed)
OUTPATIENT PHYSICAL THERAPY NEURO TREATMENT   Patient Name: Hannah Khan MRN: [id number] DOB:05/31/1956, 67 y.o., female Today's Date: 06/12/2023   PCP: Darrow Bussing, MD REFERRING PROVIDER: Alver Sorrow, NP       END OF SESSION:  PT End of Session - 06/12/23 0802     Visit Number 11    Number of Visits 17    Date for PT Re-Evaluation 06/23/23    Authorization Type [employer]    Authorization Time Period 04/27/2023-06/23/2023    Authorization - Number of Visits 17    PT Start Time 0803    PT Stop Time 0841    PT Time Calculation (min) 38 min    Activity Tolerance Patient tolerated treatment well    Behavior During Therapy [facility] for tasks assessed/performed   pt tearful regarding previous medical appointments/medicaion changes today                   Past Medical History:  Diagnosis Date   Anemia    hx of   Aortic stenosis 02/2023   Echo 03/07/23 LVEF 55-60%, mild to moderate AS   Arthritis    knees   CHF (congestive heart failure) (HCC)    chronic diastolic   Diabetes mellitus without complication (HCC)    type 2   Dyspnea    Dysrhythmia    Fibromyalgia    Gastric varices    large   H/O transfusion of platelets    Heart murmur    never has caused any problems   Hx of colonic polyps    s/p partial colectomy   Hyperlipidemia    Hypertension    Joint pain    in knees and back spasms   Left leg swelling    wear compression hose   Liver cirrhosis secondary to NASH (nonalcoholic steatohepatitis) (HCC) dx nov 2014   had enlarged spleen also    Morbid obesity (HCC)    Persistent atrial fibrillation (HCC)    Pneumonia 10/2016   x 5   PONV (postoperative nausea and vomiting)    in past none recent   Portal hypertension (HCC)    Sleep apnea    uses cpap, setting varies between 14-20   Spleen enlarged    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 [facility])    Past Surgical History:  Procedure Laterality Date   BREAST LUMPECTOMY WITH RADIOACTIVE SEED  LOCALIZATION Right 08/29/2017   Procedure: RIGHT BREAST LUMPECTOMY WITH RADIOACTIVE SEEDS LOCALIZATION;  Surgeon: Harriette Bouillon, MD;  Location: [facility] OR;  Service: General;  Laterality: Right;   CESAREAN SECTION  1989   CHOLECYSTECTOMY  20 yrs ago   COLECTOMY  2011   COLONOSCOPY     COLONOSCOPY N/A 09/05/2018   Procedure: COLONOSCOPY;  Surgeon: Willis Modena, MD;  Location: [facility];  Service: Endoscopy;  Laterality: N/A;   COLONOSCOPY WITH PROPOFOL Bilateral 05/04/2022   Procedure: COLONOSCOPY WITH PROPOFOL;  Surgeon: Willis Modena, MD;  Location: [facility];  Service: Gastroenterology;  Laterality: Bilateral;   DILATATION & CURETTAGE/HYSTEROSCOPY WITH MYOSURE N/A 05/06/2016   Procedure: DILATATION & CURETTAGE/HYSTEROSCOPY WITH MYOSURE WITH POLYPECTOMY;  Surgeon: Myna Hidalgo, DO;  Location: [facility];  Service: Gynecology;  Laterality: N/A;   ESOPHAGOGASTRODUODENOSCOPY (EGD) WITH PROPOFOL N/A 09/04/2013   Procedure: ESOPHAGOGASTRODUODENOSCOPY (EGD) WITH PROPOFOL;  Surgeon: Willis Modena, MD;  Location: [facility];  Service: Endoscopy;  Laterality: N/A;   ESOPHAGOGASTRODUODENOSCOPY (EGD) WITH PROPOFOL Bilateral 05/04/2022   Procedure: ESOPHAGOGASTRODUODENOSCOPY (EGD) WITH PROPOFOL;  Surgeon: Willis Modena, MD;  Location: [facility];  Service: Gastroenterology;  Laterality: Bilateral;   IR INTRAVASCULAR ULTRASOUND NON CORONARY  12/23/2022   IR RADIOLOGIST EVAL & MGMT  09/21/2022   IR RADIOLOGIST EVAL & MGMT  10/06/2022   IR RADIOLOGIST EVAL & MGMT  11/02/2022   IR RADIOLOGIST EVAL & MGMT  01/31/2023   IR RADIOLOGIST EVAL & MGMT  05/31/2023   IR TIPS  12/23/2022   IR US GUIDE VASC ACCESS RIGHT  12/23/2022   KNEE ARTHROSCOPY  yrs ago   bilateral, one done x 1, one done twice   POLYPECTOMY  09/05/2018   Procedure: POLYPECTOMY;  Surgeon: Willis Modena, MD;  Location: [facility];  Service: Endoscopy;;   RADIOLOGY WITH ANESTHESIA N/A 12/23/2022   Procedure: TIPS;  Surgeon: Bennie Dallas,  MD;  Location: [facility] OR;  Service: Radiology;  Laterality: N/A;   RIGHT HEART CATH N/A 05/19/2023   Procedure: RIGHT HEART CATH;  Surgeon: Dorthula Nettles, DO;  Location: [facility];  Service: Cardiovascular;  Laterality: N/A;   Patient Active Problem List   Diagnosis Date Noted   Intractable nausea and vomiting 04/07/2023   Intractable vomiting with nausea 04/06/2023   Acute diastolic congestive heart failure (HCC) 03/06/2023   Idiopathic hypotension 03/06/2023   Sepsis due to cellulitis (HCC) 03/05/2023   Severe sepsis (HCC) 03/05/2023   Portal hypertension (HCC) 12/23/2022   Acute encephalopathy 01/10/2021   Secondary hypercoagulable state (HCC) 06/23/2020   Streptococcal bacteremia 03/16/2020   Chronic venous stasis dermatitis of both lower extremities 03/16/2020   Atrial fibrillation with RVR (HCC) 04/26/2018   Acute lower UTI 04/26/2018   Liver cirrhosis secondary to NASH (nonalcoholic steatohepatitis) (HCC) 04/26/2018   Hyperlipidemia 04/26/2018   Type 2 diabetes mellitus with other specified complication (HCC)    Cellulitis of right lower extremity 03/15/2018   Sepsis (HCC) 03/14/2018   Community acquired pneumonia of right lower lobe of lung 10/31/2016   Muscular abdominal pain in right upper quadrant 07/11/2016   Chronic diastolic heart failure (HCC) 07/10/2016   Thrombocytopenia (HCC) 07/10/2016   Permanent atrial fibrillation (HCC) 01/30/2016   Type 2 diabetes mellitus without complication, without long-term current use of insulin (HCC) 01/30/2016   Morbid obesity- 12/10/2015   Cellulitis, abdominal wall    Melena 01/31/2015   Aortic heart murmur 01/31/2015   Pancytopenia (HCC) 07/23/2013   Obstructive sleep apnea 08/07/2007   Essential hypertension 08/07/2007   ALLERGIC RHINITIS 08/07/2007    ONSET DATE: 04/18/2023 (MD referral)  REFERRING DIAG:  I50.32 (ICD-10-CM) - Chronic diastolic heart failure (HCC)  Z61.09 (ICD-10-CM) - Permanent atrial fibrillation  (HCC)  K75.81,K74.60 (ICD-10-CM) - Liver cirrhosis secondary to NASH (nonalcoholic steatohepatitis) (HCC)  I35.0 (ICD-10-CM) - Aortic valve stenosis, etiology of cardiac valve disease unspecified  I95.9 (ICD-10-CM) - Hypotension, unspecified hypotension type  R53.81 (ICD-10-CM) - Physical deconditioning  E87.6 (ICD-10-CM) - Hypokalemia  I10 (ICD-10-CM) - Essential hypertension    THERAPY DIAG:  Muscle weakness (generalized)  Unsteadiness on feet  Other abnormalities of gait and mobility  Rationale for Evaluation and Treatment: Rehabilitation  SUBJECTIVE:  SUBJECTIVE STATEMENT: Had to have bloodwork done, as my potassium was low and I was getting a lot of cramps.  Pt accompanied by: self  PERTINENT HISTORY: hx of chronic diastolic heart failure, morbid obesity, OSA, NASH/cirrhosis, chronic thrombocytopenia, DM2, aortic stenosis, vasovagal presyncope, chronic atrial fibrillation not on anticoagulation. Fracture of R shoulder and partial tear/ scar tissue RTC  2019 (s/p bicepectomy)  PAIN:  Are you having pain? Yes: NPRS scale: 5-6; 3/10 Pain location: L shoulder; knee Pain description: achy, pain all the time Aggravating factors: unsure, hurts all the time Relieving factors: unsure, Salon pas cream with Lidocaine on knee helps  PRECAUTIONS: Fall and Other: morbid obesity, hx of low BP, cardiac issues ; 3# lifting restriction; a-fib.  05/23/2023:  Cardiac cath performed on 8/30 and not allowed to be immersed in water for 2 weeks.  No lifting/pushing/pulling with R arm.  RED FLAGS: None   WEIGHT BEARING RESTRICTIONS: No  FALLS: Has patient fallen in last 6 months? Yes. Number of falls 2  LIVING ENVIRONMENT: Lives with: lives with their spouse Lives in: House/apartment Stairs:  1 step into the  home from garage Has following equipment at home: Dan Humphreys - 4 wheeled  PLOF: Independent with household mobility with device and Independent with community mobility with device  PATIENT GOALS: Want to be able to get around better and do things in my house.  OBJECTIVE:    TODAY'S TREATMENT: 06/12/2023 Activity Comments  Vitals:  100/63; HR 93   Sidestepping x 1 min  BUE at counter  Heel/toe raises 2 x 10 reps   Alt side step taps 2 x 10   Alt taps to 4" step, 10 reps   Wide BOS weightshifting x 10 reps, then added UE lift to cabinet x 5 reps   Stagger stance weightshift forward/back Some pain in R knee  Standing balance: Feet apart/together head turns/nods EO, then EC 10 sec head steady Increased unsteadiness with EC; intermittent>light UE support     HOME EXERCISE PROGRAM: Access Code: [email or web address] URL: [email or web address] Date: 06/01/2023 Prepared by: [facility] - Outpatient  Rehab - [facility]  Exercises - Seated Pelvic Tilts  - 1 x daily - 7 x weekly - 3 sets - 5 reps - Seated Shoulder Rolls  - 1 x daily - 7 x weekly - 3 sets - 5 reps - Seated Long Arc Quad  - 1 x daily - 7 x weekly - 2-3 sets - 10 reps - Seated March  - 1 x daily - 7 x weekly - 2-3 sets - 10 reps - Supine Heel Slide  - 1 x daily - 4 x weekly - 1-2 sets - 10 reps - Supine Hip Abduction  - 1 x daily - 4 x weekly - 2 sets - 10 reps - Long Sitting Ankle Pumps  - 1 x daily - 7 x weekly - 2 sets - 10 reps - Small Range Straight Leg Raise  - 1 x daily - 4 x weekly - 2 sets - 10 reps (Pt is also performing seated stepping out/in, glut sets, scapular retraction 3 sets of 10)  PATIENT EDUCATION: Education details: Verbally reviewed HEP and educated pt to continue as able; fall prevention in relation to eyes closed/balance in darkness Person educated: Patient Education method: Explanation, Demonstration, and Handouts Education comprehension: verbalized understanding, returned demonstration, and  needs further education    --------------------------------------------------------------------------------- Objective measures below taken at initial evaluation:  DIAGNOSTIC FINDINGS: NA  COGNITION: Overall cognitive status: Within functional  limits for tasks assessed   SENSATION: Light touch: WFL  EDEMA:  BLE edema + 1 pitting (has compression stockings, but does not use often)  POSTURE: rounded shoulders and forward head  LOWER EXTREMITY ROM:   A/ROM WFL in sitting (some limitations due to body habitus)   LOWER EXTREMITY MMT:  Grossly tested 4/5 except knee extension L 3+/5   VITALS: BP 104/56 HR 69 O2 sats:  93>97%  TRANSFERS: Assistive device utilized: None  Sit to stand: SBA and uses BUE support Stand to sit: SBA and BUE support  GAIT: Gait pattern: step through pattern, antalgic, and wide BOS Distance walked: 50 ft Assistive device utilized: Environmental consultant - 4 wheeled Level of assistance: SBA Comments: Fatigues and has to stop gait to sit after 85 ft  FUNCTIONAL TESTS:  2 minute walk test: 85 ft; sits at 1 minute, in addition to 1 standing rest break 10 meter walk test: 21.03 sec = 1.56 ft/sec 5x sit to stand:  with UE support, 24.82 sec  TODAY'S TREATMENT:                                                                                                                              DATE: 04/27/2023    PATIENT EDUCATION: Education details: PT eval results, POC Person educated: Patient Education method: Explanation Education comprehension: verbalized understanding  HOME EXERCISE PROGRAM: Not yet initiated  GOALS: Goals reviewed with patient? Yes  SHORT TERM GOALS: Target date: 05/26/2023  Pt will be independent with HEP for improved strength, balance, gait, decreased pain. Baseline: Goal status: MET  2.  Pt will improve 5x sit<>stand to less than or equal to 20 sec to demonstrate improved functional strength and transfer efficiency. Baseline: 24.82 sec;  (05/18/23) 14 sec Goal status: MET  3. Pt will ambulate at least 115 in 2 MWT for improved gait efficiency in home. Baseline: 85 ft. With one sitting break after 1 minute; (05/18/23) 275 ft Goal status: MET  LONG TERM GOALS: Target date: 06/23/2023  Pt will be independent with progression of HEP for improved strength, balance, gait. Baseline:  Goal status: IN PROGRESS  2.  Pt will improve 5x sit<>stand to less than or equal to 12 sec to demonstrate improved functional strength and transfer efficiency. Baseline: 24.82 sec >14 sec Goal status: REVISED 05/23/2023  3.  Pt will improve gait velocity to at least 2 ft/sec for improved gait efficiency and safety. Baseline: 1.56 ft/sec Goal status: IN PROGRESS  4.  Pt will ambulate at least 325 ft in 2 MWT for improved gait efficiency in home environment. Baseline:  Goal status: REVISED, 05/23/2023  5.  Pt will verbalize ongoing plans for continued community fitness upon d/c from PT to maximize gains made in PT.  Baseline:  Goal status:  IN PROGRESS   ASSESSMENT:     CLINICAL IMPRESSION: Skilled PT session today focused on standing balance exercises, which pt does quite well for the first time  in PT session.  Pt does require several seated rest breaks, but overall, she performs standing exercises well with BUE support.  Tried standing with vision removed at counter, with pt feeling quite unsteady.  She continues to be motivated for therapy to get stronger and do more around the home.  She will continue to benefit from skilled PT towards LTGs for improved strength, functional mobility, and decreased fall risk.  OBJECTIVE IMPAIRMENTS: Abnormal gait, cardiopulmonary status limiting activity, decreased activity tolerance, decreased balance, decreased endurance, decreased mobility, difficulty walking, decreased strength, postural dysfunction, and pain.   ACTIVITY LIMITATIONS: carrying, lifting, bending, standing, transfers, reach over head,  hygiene/grooming, and locomotion level  PARTICIPATION LIMITATIONS: meal prep, cleaning, laundry, and community activity  PERSONAL FACTORS: 3+ comorbidities: significant medical history (see above)  are also affecting patient's functional outcome.   REHAB POTENTIAL: Good  CLINICAL DECISION MAKING: Evolving/moderate complexity  EVALUATION COMPLEXITY: Moderate  PLAN:  PT FREQUENCY: 2x/week  PT DURATION: 8 weeks plus eval (*will continue 2x/wk in clinic, per pt request, 05/23/2023)  PLANNED INTERVENTIONS: Therapeutic exercises, Therapeutic activity, Neuromuscular re-education, Balance training, Gait training, Patient/Family education, Self Care, Aquatic Therapy, and Manual therapy  PLAN FOR NEXT SESSION:  Continue to progress to standing balance and gentle standing strengthening (be aware of knee pain).  Monitor vitals.  (Wants to work towards walking to [employer] (in[place]), out to yard for dog; standing activities at [employer])    Gean Maidens., PT 06/12/2023, 8:41 AM  [facility] at [facility] [address] [place], [address] [place], [place] [address] Phone # [phone] Fax # [phone]

## 2023-06-13 ENCOUNTER — Other Ambulatory Visit (HOSPITAL_COMMUNITY): Payer: Medicare PPO

## 2023-06-13 ENCOUNTER — Other Ambulatory Visit (HOSPITAL_COMMUNITY): Payer: Self-pay | Admitting: Interventional Radiology

## 2023-06-13 DIAGNOSIS — K7581 Nonalcoholic steatohepatitis (NASH): Secondary | ICD-10-CM

## 2023-06-14 ENCOUNTER — Encounter: Payer: Self-pay | Admitting: Physical Therapy

## 2023-06-14 ENCOUNTER — Ambulatory Visit: Payer: Medicare PPO | Admitting: Physical Therapy

## 2023-06-14 DIAGNOSIS — M6281 Muscle weakness (generalized): Secondary | ICD-10-CM

## 2023-06-14 DIAGNOSIS — R2689 Other abnormalities of gait and mobility: Secondary | ICD-10-CM | POA: Diagnosis not present

## 2023-06-14 DIAGNOSIS — R2681 Unsteadiness on feet: Secondary | ICD-10-CM | POA: Diagnosis not present

## 2023-06-14 NOTE — Progress Notes (Signed)
ADVANCED HEART FAILURE CLINIC NOTE  Primary Care: Darrow Bussing, MD Primary Cardiologist: Chilton Si, MD HF Cardiologist: Dr. Gasper Lloyd  HPI: Hannah Khan is a 67 y.o. female with HFpEF, morbid obesity, obstructive sleep apnea, NASH/cirrhosis, thrombocytopenia, type 2 diabetes, aortic stenosis, vasovagal syncope, and chronic atrial fibrillation.  Her cardiac history dates back to June 2021 when she was diagnosed with atrial fibrillation.  At that time she decided to continue with medical management after seeing Dr. Ladona Ridgel.  She was admitted in May 2022 with atrial fibrillation and hepatic encephalopathy.  Recurrent admission 7/22 with acute on chronic HFpEF due to dietary indiscretion.  Due to her underlying cirrhosis, she was planned to undergo TIPS creation April 2024 but the procedure was aborted due to a right atrial pressure of 23 mmHg.  RHC scheduled due to inability to diurese and tolerate GDMT, suspected PAH. 05/19/23: Mildly elevated pre and postcapillary filling pressures. Normal cardiac output/cardiac index. Normal PVR and PAPi.  She saw IR on 05/31/23. Recent RHC reviewed. Felt to be an appropriate candidate for another attempt at TIPS creation, portal vein recanalization and variceal embolization. Procedure scheduled for 10/11.   Seen for follow-up 09/12. ReDS 44%. Appeared volume overloaded. Weight at home 323 lb. Torsemide increased to 80 mg q am/60 mg q pm.   She is here today for follow-up.    Past Medical History:  Diagnosis Date   Anemia    hx of   Aortic stenosis 02/2023   Echo 03/07/23 LVEF 55-60%, mild to moderate AS   Arthritis    knees   CHF (congestive heart failure) (HCC)    chronic diastolic   Diabetes mellitus without complication (HCC)    type 2   Dyspnea    Dysrhythmia    Fibromyalgia    Gastric varices    large   H/O transfusion of platelets    Heart murmur    never has caused any problems   Hx of colonic polyps    s/p partial  colectomy   Hyperlipidemia    Hypertension    Joint pain    in knees and back spasms   Left leg swelling    wear compression hose   Liver cirrhosis secondary to NASH (nonalcoholic steatohepatitis) (HCC) dx nov 2014   had enlarged spleen also    Morbid obesity (HCC)    Persistent atrial fibrillation (HCC)    Pneumonia 10/2016   x 5   PONV (postoperative nausea and vomiting)    in past none recent   Portal hypertension (HCC)    Sleep apnea    uses cpap, setting varies between 14-20   Spleen enlarged    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 [facility])    Current Outpatient Medications  Medication Sig Dispense Refill   albuterol (VENTOLIN HFA) 108 (90 Base) MCG/ACT inhaler Inhale 1-2 puffs into the lungs every 6 (six) hours as needed for wheezing or shortness of breath.     diclofenac Sodium (VOLTAREN) 1 % GEL Apply 1 Application topically at bedtime as needed (pain).     digoxin (LANOXIN) 0.125 MG tablet Take 1 tablet (0.125 mg total) by mouth daily. 30 tablet 3   diltiazem (CARDIZEM CD) 180 MG 24 hr capsule Take 1 capsule (180 mg total) by mouth daily. 90 capsule 1   lactulose (CHRONULAC) 10 GM/15ML solution Take 10 g by mouth 3 (three) times daily.     Lancets (ONETOUCH DELICA PLUS LANCET33G) MISC Apply topically 3 (three) times daily.  Lidocaine HCl-Benzyl Alcohol (SALONPAS LIDOCAINE PLUS) 4-10 % CREA Apply 1 Application topically daily as needed (knee pain).     metolazone (ZAROXOLYN) 5 MG tablet Take 1 tablet (5 mg total) by mouth once a week. Take one tablet on Sundays 30 minutes before Bumex 12 tablet 3   metoprolol tartrate (LOPRESSOR) 25 MG tablet Take 0.5 tablets (12.5 mg total) by mouth 2 (two) times daily. 90 tablet 3   midodrine (PROAMATINE) 10 MG tablet Take 1 tablet (10 mg total) by mouth 3 (three) times daily. 270 tablet 2   Multiple Vitamins-Minerals (PRESERVISION AREDS 2) CAPS Take 1 capsule by mouth 2 (two) times daily.     ONETOUCH VERIO test strip 1 each 3 (three)  times daily.     potassium chloride (MICRO-K) 10 MEQ CR capsule Take 10 capsules (100 mEq total) by mouth 2 (two) times daily. 300 capsule 0   Propylene Glycol (SYSTANE BALANCE) 0.6 % SOLN Place 1 drop into both eyes 2 (two) times daily.     torsemide (DEMADEX) 20 MG tablet Take 4 tablets (80 mg total) by mouth every morning AND 3 tablets (60 mg total) every evening. 210 tablet 3   XIFAXAN 550 MG TABS tablet Take 550 mg by mouth 2 (two) times daily.     No current facility-administered medications for this visit.   Allergies  Allergen Reactions   Celebrex [Celecoxib] Other (See Comments)    "speech slurred" with celebrex   Shellfish Allergy Swelling and Rash    TINGLING AND SWELLING OF LIPS. LARGE WHELPS QUARTER-SIZE   Barium-Containing Compounds Other (See Comments)    TACHYCARDIA   Prednisone Other (See Comments)    TACHYCARDIA   Statins Other (See Comments)    AGGRAVATED FIBROMYALGIA   Fentanyl     Hallucinations    Ibuprofen Other (See Comments)    UNSPECIFIED SPECIFIC REACTION >> "DUE TO PLATELETS"   Sglt2 Inhibitors     Cellulitis of pannus   Tramadol     Hallucination    Tylenol [Acetaminophen] Other (See Comments)    UNSPECIFIED SPECIFIC REACTION >> "DUE TO PLATELETS"   Zetia [Ezetimibe]     Headache   Social History   Socioeconomic History   Marital status: Married    Spouse name: Not on file   Number of children: Not on file   Years of education: Not on file   Highest education level: Not on file  Occupational History   Occupation: Scientist, product/process development  Tobacco Use   Smoking status: Former    Current packs/day: 0.00    Average packs/day: 1 pack/day for 3.0 years (3.0 ttl pk-yrs)    Types: Cigarettes    Start date: 09/20/1971    Quit date: 09/19/1974    Years since quitting: 48.7   Smokeless tobacco: Never  Vaping Use   Vaping status: Never Used  Substance and Sexual Activity   Alcohol use: Not Currently    Comment: occ   Drug use: No   Sexual activity:  Not on file  Other Topics Concern   Not on file  Social History Narrative   Lives in [place] with her husband.  Instructor for early childhood development.     Social Determinants of Health   Financial Resource Strain: Not on file  Food Insecurity: No Food Insecurity (04/08/2023)   Hunger Vital Sign    Worried About Running Out of Food in the Last Year: Never true    Ran Out of Food in the Last Year: Never  true  Transportation Needs: No Transportation Needs (04/08/2023)   PRAPARE - Administrator, Civil Service (Medical): No    Lack of Transportation (Non-Medical): No  Physical Activity: Not on file  Stress: Not on file  Social Connections: Unknown (01/31/2022)   [facility]   Social Network    Social Network: Not on file  Intimate Partner Violence: Not At Risk (04/08/2023)   Humiliation, Afraid, Rape, and Kick questionnaire    Fear of Current or Ex-Partner: No    Emotionally Abused: No    Physically Abused: No    Sexually Abused: No   Family History  Problem Relation Age of Onset   Heart failure Mother    Cancer Mother    Hyperlipidemia Mother    Congestive Heart Failure Mother    Stroke Mother    Lung cancer Father    Cancer Father    Cancer Brother    Hyperlipidemia Brother    Stroke Other    Breast cancer Neg Hx    There were no vitals taken for this visit.  Wt Readings from Last 3 Encounters:  06/01/23 (!) 148.5 kg (327 lb 6.4 oz)  05/19/23 (!) 152 kg (335 lb 3.2 oz)  05/08/23 (!) 156.2 kg (344 lb 6.4 oz)   ReDs: 44%  PHYSICAL EXAM: General:  well appearing.  No respiratory difficulty. Arrived in wheelchair HEENT: normal Neck: supple. JVD difficult to see. Carotids 2+ bilat; no bruits. No lymphadenopathy or thyromegaly appreciated. Cor: PMI nondisplaced. Regular rate & rhythm. No rubs, gallops or murmurs. Lungs: clear, diminished bases Abdomen: obese, soft, nontender, nondistended. No hepatosplenomegaly. No  bruits or masses. Good bowel sounds. Extremities: no cyanosis, clubbing, rash, +2-3 BLE edema. LLE wheeping. LLE with open scabs Neuro: alert & oriented x 3, cranial nerves grossly intact. moves all 4 extremities w/o difficulty. Affect pleasant but tearful.   DATA REVIEW ECG: 04/06/23: atrial fibrillation , personally reviewed  ECHO: 03/07/23: LVEF 65%, normal RV function; RV is dilated with septal flattening  CATH: 05/19/23: RHC: Mildly elevated pre and postcapillary filling pressures. Normal  cardiac output/cardiac index. Normal PVR and papi  ASSESSMENT & PLAN: 1. Heart failure with preserved ejection fraction: Echo (6/24) showed EF 65%, RV dilated with septal flattening. She had past admissions with acute on chronic dHF, 2/2 dietary indiscretion. GDMT has been complicated by CKD and hypotension.  - RHC 05/19/23: Mildly elevated pre and postcapillary filling pressures. Normal  cardiac output/cardiac index. Normal PVR and PAPi - NYHA *** - Takes metolazone once a week - On beta blocker for atrial fibrillation - Continue digoxin for RV and rate control.  - Continue midodrine 10 mg tid. - Avoid SGLT2i with bodty habitus and large pannus. - Wear compression socks  2. NASH Cirrhosis, Dx 2009 - Child pugh B, MELD 20 - Hx of esophageal varices - Prior admission for hepatic encephalopathy with lactulose/rifaximin.  - No BP room to add spiro - Continue midodrine - Planning to undergo TIPS creation, portal vein recanalization and variceal embolization with IR next month  3. Atrial fibrillation - Permanent. - CHA2DS2-VASc Score = 3  - Rate controlled - Continue diltiazem, digoxin and beta blocker - No AC 2/2 cirrhosis  4. Thrombocytopenia - Secondary to cirrhosis - Platelet counts slightly improved recently, 57K 09/24  5. OSA - Continue CPAP  6. Aortic Stenosis - mild to moderate on 6/24 echo - Follow  7. Lymphedema - Previously referred to [facility] for possible referral to  lymphatic specialist  - encouraged to wear compression stockings as tolerated and keep legs elevated.   8. Obesity - There is no height or weight on file to calculate BMI.  - Previously on Ozempic but stopped d/t nausea and gastroparesis  9. Hypokalemia - Chronic problem   Follow up ***  Anna Genre, PA-C 06/14/23

## 2023-06-14 NOTE — Therapy (Signed)
OUTPATIENT PHYSICAL THERAPY NEURO TREATMENT   Patient Name: Hannah Khan MRN: [id number] DOB:1956/05/02, 67 y.o., female Today's Date: 06/14/2023   PCP: Darrow Bussing, MD REFERRING PROVIDER: Alver Sorrow, NP       END OF SESSION:  PT End of Session - 06/14/23 0807     Visit Number 12    Number of Visits 17    Date for PT Re-Evaluation 06/23/23    Authorization Type [employer]    Authorization Time Period 04/27/2023-06/23/2023    Authorization - Visit Number 12    Authorization - Number of Visits 17    PT Start Time 0806    PT Stop Time 0846    PT Time Calculation (min) 40 min    Activity Tolerance Patient tolerated treatment well    Behavior During Therapy [facility] for tasks assessed/performed   pt tearful regarding previous medical appointments/medicaion changes today                    Past Medical History:  Diagnosis Date   Anemia    hx of   Aortic stenosis 02/2023   Echo 03/07/23 LVEF 55-60%, mild to moderate AS   Arthritis    knees   CHF (congestive heart failure) (HCC)    chronic diastolic   Diabetes mellitus without complication (HCC)    type 2   Dyspnea    Dysrhythmia    Fibromyalgia    Gastric varices    large   H/O transfusion of platelets    Heart murmur    never has caused any problems   Hx of colonic polyps    s/p partial colectomy   Hyperlipidemia    Hypertension    Joint pain    in knees and back spasms   Left leg swelling    wear compression hose   Liver cirrhosis secondary to NASH (nonalcoholic steatohepatitis) (HCC) dx nov 2014   had enlarged spleen also    Morbid obesity (HCC)    Persistent atrial fibrillation (HCC)    Pneumonia 10/2016   x 5   PONV (postoperative nausea and vomiting)    in past none recent   Portal hypertension (HCC)    Sleep apnea    uses cpap, setting varies between 14-20   Spleen enlarged    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 [facility])    Past Surgical History:  Procedure Laterality Date    BREAST LUMPECTOMY WITH RADIOACTIVE SEED LOCALIZATION Right 08/29/2017   Procedure: RIGHT BREAST LUMPECTOMY WITH RADIOACTIVE SEEDS LOCALIZATION;  Surgeon: Harriette Bouillon, MD;  Location: [facility] OR;  Service: General;  Laterality: Right;   CESAREAN SECTION  1989   CHOLECYSTECTOMY  20 yrs ago   COLECTOMY  2011   COLONOSCOPY     COLONOSCOPY N/A 09/05/2018   Procedure: COLONOSCOPY;  Surgeon: Willis Modena, MD;  Location: [facility] ENDOSCOPY;  Service: Endoscopy;  Laterality: N/A;   COLONOSCOPY WITH PROPOFOL Bilateral 05/04/2022   Procedure: COLONOSCOPY WITH PROPOFOL;  Surgeon: Willis Modena, MD;  Location: [facility] ENDOS[facility];  Service: Gastroenterology;  Laterality: Bilateral;   DILATATION & CURETTAGE/HYSTEROSCOPY WITH MYOSURE N/A 05/06/2016   Procedure: DILATATION & CURETTAGE/HYSTEROSCOPY WITH MYOSURE WITH POLYPECTOMY;  Surgeon: Myna Hidalgo, DO;  Location: [facility] ORS;  Service: Gynecology;  Laterality: N/A;   ESOPHAGOGASTRODUODENOSCOPY (EGD) WITH PROPOFOL N/A 09/04/2013   Procedure: ESOPHAGOGASTRODUODENOSCOPY (EGD) WITH PROPOFOL;  Surgeon: Willis Modena, MD;  Location: [facility];  Service: Endoscopy;  Laterality: N/A;   ESOPHAGOGASTRODUODENOSCOPY (EGD) WITH PROPOFOL Bilateral 05/04/2022   Procedure: ESOPHAGOGASTRODUODENOSCOPY (EGD) WITH  PROPOFOL;  Surgeon: Willis Modena, MD;  Location: [facility];  Service: Gastroenterology;  Laterality: Bilateral;   IR INTRAVASCULAR ULTRASOUND NON CORONARY  12/23/2022   IR RADIOLOGIST EVAL & MGMT  09/21/2022   IR RADIOLOGIST EVAL & MGMT  10/06/2022   IR RADIOLOGIST EVAL & MGMT  11/02/2022   IR RADIOLOGIST EVAL & MGMT  01/31/2023   IR RADIOLOGIST EVAL & MGMT  05/31/2023   IR TIPS  12/23/2022   IR US GUIDE VASC ACCESS RIGHT  12/23/2022   KNEE ARTHROSCOPY  yrs ago   bilateral, one done x 1, one done twice   POLYPECTOMY  09/05/2018   Procedure: POLYPECTOMY;  Surgeon: Willis Modena, MD;  Location: [facility];  Service: Endoscopy;;   RADIOLOGY WITH ANESTHESIA N/A 12/23/2022    Procedure: TIPS;  Surgeon: Bennie Dallas, MD;  Location: [facility] OR;  Service: Radiology;  Laterality: N/A;   RIGHT HEART CATH N/A 05/19/2023   Procedure: RIGHT HEART CATH;  Surgeon: Dorthula Nettles, DO;  Location: [facility] INVASIVE CV LAB;  Service: Cardiovascular;  Laterality: N/A;   Patient Active Problem List   Diagnosis Date Noted   Intractable nausea and vomiting 04/07/2023   Intractable vomiting with nausea 04/06/2023   Acute diastolic congestive heart failure (HCC) 03/06/2023   Idiopathic hypotension 03/06/2023   Sepsis due to cellulitis (HCC) 03/05/2023   Severe sepsis (HCC) 03/05/2023   Portal hypertension (HCC) 12/23/2022   Acute encephalopathy 01/10/2021   Secondary hypercoagulable state (HCC) 06/23/2020   Streptococcal bacteremia 03/16/2020   Chronic venous stasis dermatitis of both lower extremities 03/16/2020   Atrial fibrillation with RVR (HCC) 04/26/2018   Acute lower UTI 04/26/2018   Liver cirrhosis secondary to NASH (nonalcoholic steatohepatitis) (HCC) 04/26/2018   Hyperlipidemia 04/26/2018   Type 2 diabetes mellitus with other specified complication (HCC)    Cellulitis of right lower extremity 03/15/2018   Sepsis (HCC) 03/14/2018   Community acquired pneumonia of right lower lobe of lung 10/31/2016   Muscular abdominal pain in right upper quadrant 07/11/2016   Chronic diastolic heart failure (HCC) 07/10/2016   Thrombocytopenia (HCC) 07/10/2016   Permanent atrial fibrillation (HCC) 01/30/2016   Type 2 diabetes mellitus without complication, without long-term current use of insulin (HCC) 01/30/2016   Morbid obesity- 12/10/2015   Cellulitis, abdominal wall    Melena 01/31/2015   Aortic heart murmur 01/31/2015   Pancytopenia (HCC) 07/23/2013   Obstructive sleep apnea 08/07/2007   Essential hypertension 08/07/2007   ALLERGIC RHINITIS 08/07/2007    ONSET DATE: 04/18/2023 (MD referral)  REFERRING DIAG:  I50.32 (ICD-10-CM) - Chronic diastolic heart failure (HCC)  O96.29  (ICD-10-CM) - Permanent atrial fibrillation (HCC)  K75.81,K74.60 (ICD-10-CM) - Liver cirrhosis secondary to NASH (nonalcoholic steatohepatitis) (HCC)  I35.0 (ICD-10-CM) - Aortic valve stenosis, etiology of cardiac valve disease unspecified  I95.9 (ICD-10-CM) - Hypotension, unspecified hypotension type  R53.81 (ICD-10-CM) - Physical deconditioning  E87.6 (ICD-10-CM) - Hypokalemia  I10 (ICD-10-CM) - Essential hypertension    THERAPY DIAG:  Muscle weakness (generalized)  Unsteadiness on feet  Other abnormalities of gait and mobility  Rationale for Evaluation and Treatment: Rehabilitation  SUBJECTIVE:  SUBJECTIVE STATEMENT: L shoulder really bothering me this morning.  Otherwise, have had a good week. Pt accompanied by: self  PERTINENT HISTORY: hx of chronic diastolic heart failure, morbid obesity, OSA, NASH/cirrhosis, chronic thrombocytopenia, DM2, aortic stenosis, vasovagal presyncope, chronic atrial fibrillation not on anticoagulation. Fracture of R shoulder and partial tear/ scar tissue RTC  2019 (s/p bicepectomy)  PAIN:  Are you having pain? Yes: NPRS scale: 7; 4/10 Pain location: L shoulder; knee Pain description: achy, pain all the time Aggravating factors: unsure, hurts all the time Relieving factors: unsure, Salon pas cream with Lidocaine on knee helps  PRECAUTIONS: Fall and Other: morbid obesity, hx of low BP, cardiac issues ; 3# lifting restriction; a-fib.  05/23/2023:  Cardiac cath performed on 8/30 and not allowed to be immersed in water for 2 weeks.  No lifting/pushing/pulling with R arm.  RED FLAGS: None   WEIGHT BEARING RESTRICTIONS: No  FALLS: Has patient fallen in last 6 months? Yes. Number of falls 2  LIVING ENVIRONMENT: Lives with: lives with their spouse Lives in:  House/apartment Stairs:  1 step into the home from garage Has following equipment at home: Dan Humphreys - 4 wheeled  PLOF: Independent with household mobility with device and Independent with community mobility with device  PATIENT GOALS: Want to be able to get around better and do things in my house.  OBJECTIVE:    TODAY'S TREATMENT: 06/14/2023 Activity Comments  Seated gentle scapular squeezes 5 reps in sitting Replicating supine position and talked about how to do in bed  Vitals 111/66 HR 88   Heel/toe raises 2 x 10 reps   Alt side step taps 2 x 10   Wide BOS weightshifting 2 x 10 reps   Stagger stance weightshift forward/back 2 x 10 reps   Feet apart with head turns/head nods x 5, EC head steady x 10 sec Pt feels very dizzy with EC  Seated hamstring stretch, 2 x 15" Foot propped on block  Short distance gait, 15 ft, 6 reps with intermittent UE support     Access Code: [email or web address] URL: [email or web address] Date: 06/14/2023 Prepared by: [facility] - Outpatient  Rehab - [facility]  Exercises - Seated Pelvic Tilts  - 1 x daily - 7 x weekly - 3 sets - 5 reps - Seated Shoulder Rolls  - 1 x daily - 7 x weekly - 3 sets - 5 reps - Seated Long Arc Quad  - 1 x daily - 7 x weekly - 2-3 sets - 10 reps - Seated March  - 1 x daily - 7 x weekly - 2-3 sets - 10 reps - Supine Heel Slide  - 1 x daily - 4 x weekly - 1-2 sets - 10 reps - Supine Hip Abduction  - 1 x daily - 4 x weekly - 2 sets - 10 reps - Long Sitting Ankle Pumps  - 1 x daily - 7 x weekly - 2 sets - 10 reps - Small Range Straight Leg Raise  - 1 x daily - 4 x weekly - 2 sets - 10 reps - Standing Hip Abduction with Counter Support  - 1 x daily - 7 x weekly - 2 sets - 10 reps - Heel Toe Raises with Counter Support  - 1 x daily - 7 x weekly - 2 sets - 10 reps - Staggered Stance Forward Backward Weight Shift with Counter Support  - 1 x daily - 7 x weekly - 2 sets - 10 reps  PATIENT EDUCATION: Education details:  Additions to HEP-see above Person educated: Patient Education method: Explanation, Demonstration, and Handouts Education comprehension: verbalized understanding, returned demonstration, and needs further education    --------------------------------------------------------------------------------- Objective measures below taken at initial evaluation:  DIAGNOSTIC FINDINGS: NA  COGNITION: Overall cognitive status: Within functional limits for tasks assessed   SENSATION: Light touch: WFL  EDEMA:  BLE edema + 1 pitting (has compression stockings, but does not use often)  POSTURE: rounded shoulders and forward head  LOWER EXTREMITY ROM:   A/ROM WFL in sitting (some limitations due to body habitus)   LOWER EXTREMITY MMT:  Grossly tested 4/5 except knee extension L 3+/5   VITALS: BP 104/56 HR 69 O2 sats:  93>97%  TRANSFERS: Assistive device utilized: None  Sit to stand: SBA and uses BUE support Stand to sit: SBA and BUE support  GAIT: Gait pattern: step through pattern, antalgic, and wide BOS Distance walked: 50 ft Assistive device utilized: Environmental consultant - 4 wheeled Level of assistance: SBA Comments: Fatigues and has to stop gait to sit after 85 ft  FUNCTIONAL TESTS:  2 minute walk test: 85 ft; sits at 1 minute, in addition to 1 standing rest break 10 meter walk test: 21.03 sec = 1.56 ft/sec 5x sit to stand:  with UE support, 24.82 sec  TODAY'S TREATMENT:                                                                                                                              DATE: 04/27/2023    PATIENT EDUCATION: Education details: PT eval results, POC Person educated: Patient Education method: Explanation Education comprehension: verbalized understanding  HOME EXERCISE PROGRAM: Not yet initiated  GOALS: Goals reviewed with patient? Yes  SHORT TERM GOALS: Target date: 05/26/2023  Pt will be independent with HEP for improved strength, balance, gait, decreased  pain. Baseline: Goal status: MET  2.  Pt will improve 5x sit<>stand to less than or equal to 20 sec to demonstrate improved functional strength and transfer efficiency. Baseline: 24.82 sec; (05/18/23) 14 sec Goal status: MET  3. Pt will ambulate at least 115 in 2 MWT for improved gait efficiency in home. Baseline: 85 ft. With one sitting break after 1 minute; (05/18/23) 275 ft Goal status: MET  LONG TERM GOALS: Target date: 06/23/2023  Pt will be independent with progression of HEP for improved strength, balance, gait. Baseline:  Goal status: IN PROGRESS  2.  Pt will improve 5x sit<>stand to less than or equal to 12 sec to demonstrate improved functional strength and transfer efficiency. Baseline: 24.82 sec >14 sec Goal status: REVISED 05/23/2023  3.  Pt will improve gait velocity to at least 2 ft/sec for improved gait efficiency and safety. Baseline: 1.56 ft/sec Goal status: IN PROGRESS  4.  Pt will ambulate at least 325 ft in 2 MWT for improved gait efficiency in home environment. Baseline:  Goal status: REVISED, 05/23/2023  5.  Pt will verbalize ongoing  plans for continued community fitness upon d/c from PT to maximize gains made in PT.  Baseline:  Goal status:  IN PROGRESS   ASSESSMENT:     CLINICAL IMPRESSION: Skilled PT session today again focused on standing balance exercises.  Added several balance exercises to HEP, as pt performs these well in session.  Pt does require several seated rest breaks, and during these breaks, educated on HEP updates.  Tried standing with vision removed at counter, with pt continuing to feel quite unsteady.  She will continue to benefit from skilled PT towards LTGs for improved strength, functional mobility, and decreased fall risk.  OBJECTIVE IMPAIRMENTS: Abnormal gait, cardiopulmonary status limiting activity, decreased activity tolerance, decreased balance, decreased endurance, decreased mobility, difficulty walking, decreased strength,  postural dysfunction, and pain.   ACTIVITY LIMITATIONS: carrying, lifting, bending, standing, transfers, reach over head, hygiene/grooming, and locomotion level  PARTICIPATION LIMITATIONS: meal prep, cleaning, laundry, and community activity  PERSONAL FACTORS: 3+ comorbidities: significant medical history (see above)  are also affecting patient's functional outcome.   REHAB POTENTIAL: Good  CLINICAL DECISION MAKING: Evolving/moderate complexity  EVALUATION COMPLEXITY: Moderate  PLAN:  PT FREQUENCY: 2x/week  PT DURATION: 8 weeks plus eval (*will continue 2x/wk in clinic, per pt request, 05/23/2023)  PLANNED INTERVENTIONS: Therapeutic exercises, Therapeutic activity, Neuromuscular re-education, Balance training, Gait training, Patient/Family education, Self Care, Aquatic Therapy, and Manual therapy  PLAN FOR NEXT SESSION:  Review HEP; continue to progress to standing balance and gentle standing strengthening (be aware of knee pain).  Monitor vitals.  (Wants to work towards walking to [employer] (incline), out to yard for dog; standing activities at [employer])    Gean Maidens., PT 06/14/2023, 8:48 AM  [facility] at [facility] [address] [place], [address] [place], [place] [address] Phone # [phone] Fax # [phone]

## 2023-06-15 ENCOUNTER — Encounter (HOSPITAL_COMMUNITY): Payer: Self-pay

## 2023-06-15 ENCOUNTER — Ambulatory Visit (HOSPITAL_COMMUNITY)
Admission: RE | Admit: 2023-06-15 | Discharge: 2023-06-15 | Disposition: A | Discharge status: Home / Self Care | Payer: Medicare PPO | Source: Ambulatory Visit | Attending: Physician Assistant | Admitting: Physician Assistant

## 2023-06-15 VITALS — BP 110/60 | HR 86 | Wt 323.6 lb

## 2023-06-15 DIAGNOSIS — I361 Nonrheumatic tricuspid (valve) insufficiency: Secondary | ICD-10-CM | POA: Insufficient documentation

## 2023-06-15 DIAGNOSIS — E876 Hypokalemia: Secondary | ICD-10-CM | POA: Diagnosis not present

## 2023-06-15 DIAGNOSIS — I4821 Permanent atrial fibrillation: Secondary | ICD-10-CM | POA: Insufficient documentation

## 2023-06-15 DIAGNOSIS — K7469 Other cirrhosis of liver: Secondary | ICD-10-CM | POA: Insufficient documentation

## 2023-06-15 DIAGNOSIS — D6959 Other secondary thrombocytopenia: Secondary | ICD-10-CM | POA: Insufficient documentation

## 2023-06-15 DIAGNOSIS — I130 Hypertensive heart and chronic kidney disease with heart failure and stage 1 through stage 4 chronic kidney disease, or unspecified chronic kidney disease: Secondary | ICD-10-CM | POA: Diagnosis not present

## 2023-06-15 DIAGNOSIS — N189 Chronic kidney disease, unspecified: Secondary | ICD-10-CM | POA: Insufficient documentation

## 2023-06-15 DIAGNOSIS — K7581 Nonalcoholic steatohepatitis (NASH): Secondary | ICD-10-CM

## 2023-06-15 DIAGNOSIS — I350 Nonrheumatic aortic (valve) stenosis: Secondary | ICD-10-CM | POA: Diagnosis not present

## 2023-06-15 DIAGNOSIS — Z6841 Body Mass Index (BMI) 40.0 and over, adult: Secondary | ICD-10-CM | POA: Insufficient documentation

## 2023-06-15 DIAGNOSIS — K7460 Unspecified cirrhosis of liver: Secondary | ICD-10-CM

## 2023-06-15 DIAGNOSIS — I890 Lymphedema, not elsewhere classified: Secondary | ICD-10-CM | POA: Diagnosis not present

## 2023-06-15 DIAGNOSIS — I5032 Chronic diastolic (congestive) heart failure: Secondary | ICD-10-CM | POA: Insufficient documentation

## 2023-06-15 DIAGNOSIS — E1122 Type 2 diabetes mellitus with diabetic chronic kidney disease: Secondary | ICD-10-CM | POA: Diagnosis not present

## 2023-06-15 DIAGNOSIS — I5030 Unspecified diastolic (congestive) heart failure: Secondary | ICD-10-CM | POA: Diagnosis present

## 2023-06-15 DIAGNOSIS — G4733 Obstructive sleep apnea (adult) (pediatric): Secondary | ICD-10-CM | POA: Diagnosis not present

## 2023-06-15 DIAGNOSIS — I872 Venous insufficiency (chronic) (peripheral): Secondary | ICD-10-CM | POA: Diagnosis not present

## 2023-06-15 LAB — BASIC METABOLIC PANEL WITH GFR
Anion gap: 10 (ref 5–15)
BUN: 22 mg/dL (ref 8–23)
CO2: 22 mmol/L (ref 22–32)
Calcium: 8.7 mg/dL — ABNORMAL LOW (ref 8.9–10.3)
Chloride: 103 mmol/L (ref 98–111)
Creatinine, Ser: 1.48 mg/dL — ABNORMAL HIGH (ref 0.44–1.00)
GFR, Estimated: 39 mL/min — ABNORMAL LOW
Glucose, Bld: 170 mg/dL — ABNORMAL HIGH (ref 70–99)
Potassium: 4.5 mmol/L (ref 3.5–5.1)
Sodium: 135 mmol/L (ref 135–145)

## 2023-06-15 MED ORDER — POTASSIUM CHLORIDE ER 10 MEQ PO CPCR: 100.0000 meq | ORAL_CAPSULE | Freq: Two times a day (BID) | ORAL | 5 refills | Status: DC

## 2023-06-15 NOTE — Patient Instructions (Addendum)
Labs done today. We will contact you only if your labs are abnormal.  REMEMBER TO TAKE AN EXTRA 40MEQ(4 TABLETS) OF POTASSIUM WHEN TAKING METOLAZONE.   No other medication changes were made. Please continue all current medications as prescribed.  Your physician recommends that you schedule a follow-up appointment in: 2 months with Dr. Gasper Lloyd  If you have any questions or concerns before your next appointment please send [place] a message through [facility] or call our office at [phone].    TO LEAVE A MESSAGE FOR THE NURSE SELECT OPTION 2, PLEASE LEAVE A MESSAGE INCLUDING: YOUR NAME DATE OF BIRTH CALL BACK NUMBER REASON FOR CALL**this is important as we prioritize the call backs  YOU WILL RECEIVE A CALL BACK THE SAME DAY AS LONG AS YOU CALL BEFORE 4:00 PM   Do the following things EVERYDAY: Weigh yourself in the morning before breakfast. Write it down and keep it in a log. Take your medicines as prescribed Eat low salt foods--Limit salt (sodium) to 2000 mg per day.  Stay as active as you can everyday Limit all fluids for the day to less than 2 liters   At the [facility], you and your health needs are our priority. As part of our continuing mission to provide you with exceptional heart care, we have created designated Provider Care Teams. These Care Teams include your primary Cardiologist (physician) and Advanced Practice Providers (APPs- Physician Assistants and Nurse Practitioners) who all work together to provide you with the care you need, when you need it.   You may see any of the following providers on your designated Care Team at your next follow up: Dr Arvilla Meres Dr Marca Ancona Dr. Marcos Eke, NP Robbie Lis, [place] [facility] [place], [place] Brynda Peon, NP Karle Plumber, PharmD   Please be sure to bring in all your medications bottles to every appointment.    Thank you for choosing [facility]

## 2023-06-19 ENCOUNTER — Ambulatory Visit: Payer: Medicare PPO | Admitting: Physical Therapy

## 2023-06-19 ENCOUNTER — Encounter: Payer: Self-pay | Admitting: Physical Therapy

## 2023-06-19 DIAGNOSIS — R2689 Other abnormalities of gait and mobility: Secondary | ICD-10-CM | POA: Diagnosis not present

## 2023-06-19 DIAGNOSIS — R2681 Unsteadiness on feet: Secondary | ICD-10-CM

## 2023-06-19 DIAGNOSIS — M6281 Muscle weakness (generalized): Secondary | ICD-10-CM

## 2023-06-19 NOTE — Therapy (Signed)
OUTPATIENT PHYSICAL THERAPY NEURO TREATMENT   Patient Name: Hannah Khan MRN: [id number] DOB:1956/06/04, 67 y.o., female Today's Date: 06/19/2023   PCP: Darrow Bussing, MD REFERRING PROVIDER: Alver Sorrow, NP       END OF SESSION:  PT End of Session - 06/19/23 0807     Visit Number 13    Number of Visits 17    Date for PT Re-Evaluation 06/23/23    Authorization Type [employer]    Authorization Time Period 04/27/2023-06/23/2023    Authorization - Number of Visits 17    PT Start Time 0807    PT Stop Time 0848    PT Time Calculation (min) 41 min    Activity Tolerance Patient tolerated treatment well    Behavior During Therapy [facility] for tasks assessed/performed   pt tearful regarding previous medical appointments/medicaion changes today                     Past Medical History:  Diagnosis Date   Anemia    hx of   Aortic stenosis 02/2023   Echo 03/07/23 LVEF 55-60%, mild to moderate AS   Arthritis    knees   CHF (congestive heart failure) (HCC)    chronic diastolic   Diabetes mellitus without complication (HCC)    type 2   Dyspnea    Dysrhythmia    Fibromyalgia    Gastric varices    large   H/O transfusion of platelets    Heart murmur    never has caused any problems   Hx of colonic polyps    s/p partial colectomy   Hyperlipidemia    Hypertension    Joint pain    in knees and back spasms   Left leg swelling    wear compression hose   Liver cirrhosis secondary to NASH (nonalcoholic steatohepatitis) (HCC) dx nov 2014   had enlarged spleen also    Morbid obesity (HCC)    Persistent atrial fibrillation (HCC)    Pneumonia 10/2016   x 5   PONV (postoperative nausea and vomiting)    in past none recent   Portal hypertension (HCC)    Sleep apnea    uses cpap, setting varies between 14-20   Spleen enlarged    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 [facility])    Past Surgical History:  Procedure Laterality Date   BREAST LUMPECTOMY WITH RADIOACTIVE SEED  LOCALIZATION Right 08/29/2017   Procedure: RIGHT BREAST LUMPECTOMY WITH RADIOACTIVE SEEDS LOCALIZATION;  Surgeon: Harriette Bouillon, MD;  Location: [facility] OR;  Service: General;  Laterality: Right;   CESAREAN SECTION  1989   CHOLECYSTECTOMY  20 yrs ago   COLECTOMY  2011   COLONOSCOPY     COLONOSCOPY N/A 09/05/2018   Procedure: COLONOSCOPY;  Surgeon: Willis Modena, MD;  Location: [facility];  Service: Endoscopy;  Laterality: N/A;   COLONOSCOPY WITH PROPOFOL Bilateral 05/04/2022   Procedure: COLONOSCOPY WITH PROPOFOL;  Surgeon: Willis Modena, MD;  Location: [facility];  Service: Gastroenterology;  Laterality: Bilateral;   DILATATION & CURETTAGE/HYSTEROSCOPY WITH MYOSURE N/A 05/06/2016   Procedure: DILATATION & CURETTAGE/HYSTEROSCOPY WITH MYOSURE WITH POLYPECTOMY;  Surgeon: Myna Hidalgo, DO;  Location: [facility];  Service: Gynecology;  Laterality: N/A;   ESOPHAGOGASTRODUODENOSCOPY (EGD) WITH PROPOFOL N/A 09/04/2013   Procedure: ESOPHAGOGASTRODUODENOSCOPY (EGD) WITH PROPOFOL;  Surgeon: Willis Modena, MD;  Location: [facility];  Service: Endoscopy;  Laterality: N/A;   ESOPHAGOGASTRODUODENOSCOPY (EGD) WITH PROPOFOL Bilateral 05/04/2022   Procedure: ESOPHAGOGASTRODUODENOSCOPY (EGD) WITH PROPOFOL;  Surgeon: Willis Modena, MD;  Location: [facility];  Service: Gastroenterology;  Laterality: Bilateral;   IR INTRAVASCULAR ULTRASOUND NON CORONARY  12/23/2022   IR RADIOLOGIST EVAL & MGMT  09/21/2022   IR RADIOLOGIST EVAL & MGMT  10/06/2022   IR RADIOLOGIST EVAL & MGMT  11/02/2022   IR RADIOLOGIST EVAL & MGMT  01/31/2023   IR RADIOLOGIST EVAL & MGMT  05/31/2023   IR TIPS  12/23/2022   IR US GUIDE VASC ACCESS RIGHT  12/23/2022   KNEE ARTHROSCOPY  yrs ago   bilateral, one done x 1, one done twice   POLYPECTOMY  09/05/2018   Procedure: POLYPECTOMY;  Surgeon: Willis Modena, MD;  Location: [facility];  Service: Endoscopy;;   RADIOLOGY WITH ANESTHESIA N/A 12/23/2022   Procedure: TIPS;  Surgeon: Bennie Dallas,  MD;  Location: [facility];  Service: Radiology;  Laterality: N/A;   RIGHT HEART CATH N/A 05/19/2023   Procedure: RIGHT HEART CATH;  Surgeon: Dorthula Nettles, DO;  Location: [facility];  Service: Cardiovascular;  Laterality: N/A;   Patient Active Problem List   Diagnosis Date Noted   Intractable nausea and vomiting 04/07/2023   Intractable vomiting with nausea 04/06/2023   Acute diastolic congestive heart failure (HCC) 03/06/2023   Idiopathic hypotension 03/06/2023   Sepsis due to cellulitis (HCC) 03/05/2023   Severe sepsis (HCC) 03/05/2023   Portal hypertension (HCC) 12/23/2022   Acute encephalopathy 01/10/2021   Secondary hypercoagulable state (HCC) 06/23/2020   Streptococcal bacteremia 03/16/2020   Chronic venous stasis dermatitis of both lower extremities 03/16/2020   Atrial fibrillation with RVR (HCC) 04/26/2018   Acute lower UTI 04/26/2018   Liver cirrhosis secondary to NASH (nonalcoholic steatohepatitis) (HCC) 04/26/2018   Hyperlipidemia 04/26/2018   Type 2 diabetes mellitus with other specified complication (HCC)    Cellulitis of right lower extremity 03/15/2018   Sepsis (HCC) 03/14/2018   Community acquired pneumonia of right lower lobe of lung 10/31/2016   Muscular abdominal pain in right upper quadrant 07/11/2016   Chronic diastolic heart failure (HCC) 07/10/2016   Thrombocytopenia (HCC) 07/10/2016   Permanent atrial fibrillation (HCC) 01/30/2016   Type 2 diabetes mellitus without complication, without long-term current use of insulin (HCC) 01/30/2016   Morbid obesity- 12/10/2015   Cellulitis, abdominal wall    Melena 01/31/2015   Aortic heart murmur 01/31/2015   Pancytopenia (HCC) 07/23/2013   Obstructive sleep apnea 08/07/2007   Essential hypertension 08/07/2007   ALLERGIC RHINITIS 08/07/2007    ONSET DATE: 04/18/2023 (MD referral)  REFERRING DIAG:  I50.32 (ICD-10-CM) - Chronic diastolic heart failure (HCC)  W09.81 (ICD-10-CM) - Permanent atrial fibrillation  (HCC)  K75.81,K74.60 (ICD-10-CM) - Liver cirrhosis secondary to NASH (nonalcoholic steatohepatitis) (HCC)  I35.0 (ICD-10-CM) - Aortic valve stenosis, etiology of cardiac valve disease unspecified  I95.9 (ICD-10-CM) - Hypotension, unspecified hypotension type  R53.81 (ICD-10-CM) - Physical deconditioning  E87.6 (ICD-10-CM) - Hypokalemia  I10 (ICD-10-CM) - Essential hypertension    THERAPY DIAG:  Muscle weakness (generalized)  Unsteadiness on feet  Other abnormalities of gait and mobility  Rationale for Evaluation and Treatment: Rehabilitation  SUBJECTIVE:  SUBJECTIVE STATEMENT: Spent weekend with daughter.  Doing okay today.  Feel like I need to do more walking.  Still would like to walk more without walker. Pt accompanied by: self  PERTINENT HISTORY: hx of chronic diastolic heart failure, morbid obesity, OSA, NASH/cirrhosis, chronic thrombocytopenia, DM2, aortic stenosis, vasovagal presyncope, chronic atrial fibrillation not on anticoagulation. Fracture of R shoulder and partial tear/ scar tissue RTC  2019 (s/p bicepectomy)  PAIN:  Are you having pain? Yes: NPRS scale: 7; 4/10 Pain location: L shoulder; knee Pain description: achy, pain all the time Aggravating factors: unsure, hurts all the time Relieving factors: unsure, Salon pas cream with Lidocaine on knee helps  PRECAUTIONS: Fall and Other: morbid obesity, hx of low BP, cardiac issues ; 3# lifting restriction; a-fib.  05/23/2023:  Cardiac cath performed on 8/30 and not allowed to be immersed in water for 2 weeks.  No lifting/pushing/pulling with R arm.  RED FLAGS: None   WEIGHT BEARING RESTRICTIONS: No  FALLS: Has patient fallen in last 6 months? Yes. Number of falls 2  LIVING ENVIRONMENT: Lives with: lives with their spouse Lives in:  House/apartment Stairs:  1 step into the home from garage Has following equipment at home: Dan Humphreys - 4 wheeled  PLOF: Independent with household mobility with device and Independent with community mobility with device  PATIENT GOALS: Want to be able to get around better and do things in my house.  OBJECTIVE:    TODAY'S TREATMENT: 06/19/2023 Activity Comments  Vitals:  104/64 HR 89   Berg Balance:  37/56  Scores <45/56 indicate increased fall risk  Gait x 2 minutes with rollator-distance of 160 ft Had to stop after 1:15 due to shoulder pain  Gait velocity:  13.63 sec = 2.41 ft/sec using rollator Improved from 1.5 ft/sec at eval  Gait x 200 ft, 2 minutes, rollator With cues for less fwd flexed posture, light UE support at walker         Access Code: [email or web address] URL: [email or web address] Date: 06/14/2023 Prepared by: [facility] - Outpatient  Rehab - [facility]  Exercises - Seated Pelvic Tilts  - 1 x daily - 7 x weekly - 3 sets - 5 reps - Seated Shoulder Rolls  - 1 x daily - 7 x weekly - 3 sets - 5 reps - Seated Long Arc Quad  - 1 x daily - 7 x weekly - 2-3 sets - 10 reps - Seated March  - 1 x daily - 7 x weekly - 2-3 sets - 10 reps - Supine Heel Slide  - 1 x daily - 4 x weekly - 1-2 sets - 10 reps - Supine Hip Abduction  - 1 x daily - 4 x weekly - 2 sets - 10 reps - Long Sitting Ankle Pumps  - 1 x daily - 7 x weekly - 2 sets - 10 reps - Small Range Straight Leg Raise  - 1 x daily - 4 x weekly - 2 sets - 10 reps - Standing Hip Abduction with Counter Support  - 1 x daily - 7 x weekly - 2 sets - 10 reps - Heel Toe Raises with Counter Support  - 1 x daily - 7 x weekly - 2 sets - 10 reps - Staggered Stance Forward Backward Weight Shift with Counter Support  - 1 x daily - 7 x weekly - 2 sets - 10 reps      PATIENT EDUCATION: Education details: Results of Berg Balance, POC and discussed pt's goals (would  like to continue therapy, but would need to be on hold due to  upcoming procedure), walking program at home-discussed walking 3-4 times/day, 2 minutes with rollator Person educated: Patient Education method: Explanation, Demonstration, and Handouts Education comprehension: verbalized understanding, returned demonstration, and needs further education    --------------------------------------------------------------------------------- Objective measures below taken at initial evaluation:  DIAGNOSTIC FINDINGS: NA  COGNITION: Overall cognitive status: Within functional limits for tasks assessed   SENSATION: Light touch: WFL  EDEMA:  BLE edema + 1 pitting (has compression stockings, but does not use often)  POSTURE: rounded shoulders and forward head  LOWER EXTREMITY ROM:   A/ROM WFL in sitting (some limitations due to body habitus)   LOWER EXTREMITY MMT:  Grossly tested 4/5 except knee extension L 3+/5   VITALS: BP 104/56 HR 69 O2 sats:  93>97%  TRANSFERS: Assistive device utilized: None  Sit to stand: SBA and uses BUE support Stand to sit: SBA and BUE support  GAIT: Gait pattern: step through pattern, antalgic, and wide BOS Distance walked: 50 ft Assistive device utilized: Environmental consultant - 4 wheeled Level of assistance: SBA Comments: Fatigues and has to stop gait to sit after 85 ft  FUNCTIONAL TESTS:  2 minute walk test: 85 ft; sits at 1 minute, in addition to 1 standing rest break 10 meter walk test: 21.03 sec = 1.56 ft/sec 5x sit to stand:  with UE support, 24.82 sec  TODAY'S TREATMENT:                                                                                                                              DATE: 04/27/2023    PATIENT EDUCATION: Education details: PT eval results, POC Person educated: Patient Education method: Explanation Education comprehension: verbalized understanding  HOME EXERCISE PROGRAM: Not yet initiated  GOALS: Goals reviewed with patient? Yes  SHORT TERM GOALS: Target date: 05/26/2023  Pt will  be independent with HEP for improved strength, balance, gait, decreased pain. Baseline: Goal status: MET  2.  Pt will improve 5x sit<>stand to less than or equal to 20 sec to demonstrate improved functional strength and transfer efficiency. Baseline: 24.82 sec; (05/18/23) 14 sec Goal status: MET  3. Pt will ambulate at least 115 in 2 MWT for improved gait efficiency in home. Baseline: 85 ft. With one sitting break after 1 minute; (05/18/23) 275 ft Goal status: MET  LONG TERM GOALS: Target date: 06/23/2023  Pt will be independent with progression of HEP for improved strength, balance, gait. Baseline:  Goal status: IN PROGRESS  2.  Pt will improve 5x sit<>stand to less than or equal to 12 sec to demonstrate improved functional strength and transfer efficiency. Baseline: 24.82 sec >14 sec Goal status: REVISED 05/23/2023  3.  Pt will improve gait velocity to at least 2 ft/sec for improved gait efficiency and safety. Baseline: 1.56 ft/sec>2.54 ft/sec 06/19/2023 Goal status: GOAL MET 06/19/2023  4.  Pt will ambulate at least 325 ft in 2  MWT for improved gait efficiency in home environment. Baseline:  Goal status: REVISED, 05/23/2023  5.  Pt will verbalize ongoing plans for continued community fitness upon d/c from PT to maximize gains made in PT.  Baseline:  Goal status:  IN PROGRESS   ASSESSMENT:     CLINICAL IMPRESSION: Skilled PT session today focused on gait activities, with focus on posture and less forward flexed posture/reliance on UEs through rollator.  Assessed Berg Balance test [phone] indicates increased fall risk and mostly needing to use rollator except for short household distances.   Pt has met LTG 3 for gait velocity.  Worked on 1-2 minute walking bouts with rollator; back pain limits her distance.  Will check remaining LTGs next visit, and may likely extend POC for further balance work.   OBJECTIVE IMPAIRMENTS: Abnormal gait, cardiopulmonary status limiting activity,  decreased activity tolerance, decreased balance, decreased endurance, decreased mobility, difficulty walking, decreased strength, postural dysfunction, and pain.   ACTIVITY LIMITATIONS: carrying, lifting, bending, standing, transfers, reach over head, hygiene/grooming, and locomotion level  PARTICIPATION LIMITATIONS: meal prep, cleaning, laundry, and community activity  PERSONAL FACTORS: 3+ comorbidities: significant medical history (see above)  are also affecting patient's functional outcome.   REHAB POTENTIAL: Good  CLINICAL DECISION MAKING: Evolving/moderate complexity  EVALUATION COMPLEXITY: Moderate  PLAN:  PT FREQUENCY: 2x/week  PT DURATION: 8 weeks plus eval (*will continue 2x/wk in clinic, per pt request, 05/23/2023)  PLANNED INTERVENTIONS: Therapeutic exercises, Therapeutic activity, Neuromuscular re-education, Balance training, Gait training, Patient/Family education, Self Care, Aquatic Therapy, and Manual therapy  PLAN FOR NEXT SESSION:  Check remaining goals and discuss POC.  May need to place on hold and await resume orders, given pt's procedure next week.   Gean Maidens., PT 06/19/2023, 8:50 AM  [facility] at [facility] [address] [place], [address] [place], [place] [address] Phone # [phone] Fax # [phone]

## 2023-06-21 ENCOUNTER — Encounter: Payer: Self-pay | Admitting: Physical Therapy

## 2023-06-21 ENCOUNTER — Ambulatory Visit: Payer: Medicare PPO | Attending: Family | Admitting: Physical Therapy

## 2023-06-21 DIAGNOSIS — R2681 Unsteadiness on feet: Secondary | ICD-10-CM | POA: Insufficient documentation

## 2023-06-21 DIAGNOSIS — M6281 Muscle weakness (generalized): Secondary | ICD-10-CM | POA: Diagnosis not present

## 2023-06-21 DIAGNOSIS — R2689 Other abnormalities of gait and mobility: Secondary | ICD-10-CM | POA: Insufficient documentation

## 2023-06-21 NOTE — Therapy (Signed)
OUTPATIENT PHYSICAL THERAPY NEURO TREATMENT   Patient Name: Hannah Khan MRN: [id number] DOB:[birth year], 67 y.o., female Today's Date: 06/21/2023   PCP: Darrow Bussing, MD REFERRING PROVIDER: Alver Sorrow, NP       END OF SESSION:  PT End of Session - 06/21/23 0757     Visit Number 14    Number of Visits 17    Date for PT Re-Evaluation 06/23/23    Authorization Type [employer]    Authorization Time Period 04/27/2023-06/23/2023    Authorization - Visit Number 14    Authorization - Number of Visits 17    PT Start Time 0803    PT Stop Time 0845    PT Time Calculation (min) 42 min    Activity Tolerance Patient tolerated treatment well    Behavior During Therapy [facility] for tasks assessed/performed                       Past Medical History:  Diagnosis Date   Anemia    hx of   Aortic stenosis 02/2023   Echo 03/07/23 LVEF 55-60%, mild to moderate AS   Arthritis    knees   CHF (congestive heart failure) (HCC)    chronic diastolic   Diabetes mellitus without complication (HCC)    type 2   Dyspnea    Dysrhythmia    Fibromyalgia    Gastric varices    large   H/O transfusion of platelets    Heart murmur    never has caused any problems   Hx of colonic polyps    s/p partial colectomy   Hyperlipidemia    Hypertension    Joint pain    in knees and back spasms   Left leg swelling    wear compression hose   Liver cirrhosis secondary to NASH (nonalcoholic steatohepatitis) (HCC) dx nov 2014   had enlarged spleen also    Morbid obesity (HCC)    Persistent atrial fibrillation (HCC)    Pneumonia 10/2016   x 5   PONV (postoperative nausea and vomiting)    in past none recent   Portal hypertension (HCC)    Sleep apnea    uses cpap, setting varies between 14-20   Spleen enlarged    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 [facility])    Past Surgical History:  Procedure Laterality Date   BREAST LUMPECTOMY WITH RADIOACTIVE SEED LOCALIZATION Right 08/29/2017    Procedure: RIGHT BREAST LUMPECTOMY WITH RADIOACTIVE SEEDS LOCALIZATION;  Surgeon: Harriette Bouillon, MD;  Location: [facility] OR;  Service: General;  Laterality: Right;   CESAREAN SECTION  1989   CHOLECYSTECTOMY  20 yrs ago   COLECTOMY  2011   COLONOSCOPY     COLONOSCOPY N/A 09/05/2018   Procedure: COLONOSCOPY;  Surgeon: Willis Modena, MD;  Location: [facility];  Service: Endoscopy;  Laterality: N/A;   COLONOSCOPY WITH PROPOFOL Bilateral 05/04/2022   Procedure: COLONOSCOPY WITH PROPOFOL;  Surgeon: Willis Modena, MD;  Location: [facility];  Service: Gastroenterology;  Laterality: Bilateral;   DILATATION & CURETTAGE/HYSTEROSCOPY WITH MYOSURE N/A 05/06/2016   Procedure: DILATATION & CURETTAGE/HYSTEROSCOPY WITH MYOSURE WITH POLYPECTOMY;  Surgeon: Myna Hidalgo, DO;  Location: [facility];  Service: Gynecology;  Laterality: N/A;   ESOPHAGOGASTRODUODENOSCOPY (EGD) WITH PROPOFOL N/A 09/04/2013   Procedure: ESOPHAGOGASTRODUODENOSCOPY (EGD) WITH PROPOFOL;  Surgeon: Willis Modena, MD;  Location: [facility];  Service: Endoscopy;  Laterality: N/A;   ESOPHAGOGASTRODUODENOSCOPY (EGD) WITH PROPOFOL Bilateral 05/04/2022   Procedure: ESOPHAGOGASTRODUODENOSCOPY (EGD) WITH PROPOFOL;  Surgeon: Willis Modena, MD;  Location: [facility];  Service: Gastroenterology;  Laterality: Bilateral;   IR INTRAVASCULAR ULTRASOUND NON CORONARY  12/23/2022   IR RADIOLOGIST EVAL & MGMT  09/21/2022   IR RADIOLOGIST EVAL & MGMT  10/06/2022   IR RADIOLOGIST EVAL & MGMT  11/02/2022   IR RADIOLOGIST EVAL & MGMT  01/31/2023   IR RADIOLOGIST EVAL & MGMT  05/31/2023   IR TIPS  12/23/2022   IR US GUIDE VASC ACCESS RIGHT  12/23/2022   KNEE ARTHROSCOPY  yrs ago   bilateral, one done x 1, one done twice   POLYPECTOMY  09/05/2018   Procedure: POLYPECTOMY;  Surgeon: Willis Modena, MD;  Location: [facility];  Service: Endoscopy;;   RADIOLOGY WITH ANESTHESIA N/A 12/23/2022   Procedure: TIPS;  Surgeon: Bennie Dallas, MD;  Location: [facility] OR;  Service:  Radiology;  Laterality: N/A;   RIGHT HEART CATH N/A 05/19/2023   Procedure: RIGHT HEART CATH;  Surgeon: Dorthula Nettles, DO;  Location: [facility];  Service: Cardiovascular;  Laterality: N/A;   Patient Active Problem List   Diagnosis Date Noted   Intractable nausea and vomiting 04/07/2023   Intractable vomiting with nausea 04/06/2023   Acute diastolic congestive heart failure (HCC) 03/06/2023   Idiopathic hypotension 03/06/2023   Sepsis due to cellulitis (HCC) 03/05/2023   Severe sepsis (HCC) 03/05/2023   Portal hypertension (HCC) 12/23/2022   Acute encephalopathy 01/10/2021   Secondary hypercoagulable state (HCC) 06/23/2020   Streptococcal bacteremia 03/16/2020   Chronic venous stasis dermatitis of both lower extremities 03/16/2020   Atrial fibrillation with RVR (HCC) 04/26/2018   Acute lower UTI 04/26/2018   Liver cirrhosis secondary to NASH (nonalcoholic steatohepatitis) (HCC) 04/26/2018   Hyperlipidemia 04/26/2018   Type 2 diabetes mellitus with other specified complication (HCC)    Cellulitis of right lower extremity 03/15/2018   Sepsis (HCC) 03/14/2018   Community acquired pneumonia of right lower lobe of lung 10/31/2016   Muscular abdominal pain in right upper quadrant 07/11/2016   Chronic diastolic heart failure (HCC) 07/10/2016   Thrombocytopenia (HCC) 07/10/2016   Permanent atrial fibrillation (HCC) 01/30/2016   Type 2 diabetes mellitus without complication, without long-term current use of insulin (HCC) 01/30/2016   Morbid obesity- 12/10/2015   Cellulitis, abdominal wall    Melena 01/31/2015   Aortic heart murmur 01/31/2015   Pancytopenia (HCC) 07/23/2013   Obstructive sleep apnea 08/07/2007   Essential hypertension 08/07/2007   Allergic rhinitis 08/07/2007    ONSET DATE: 04/18/2023 (MD referral)  REFERRING DIAG:  I50.32 (ICD-10-CM) - Chronic diastolic heart failure (HCC)  N82.95 (ICD-10-CM) - Permanent atrial fibrillation (HCC)  K75.81,K74.60  (ICD-10-CM) - Liver cirrhosis secondary to NASH (nonalcoholic steatohepatitis) (HCC)  I35.0 (ICD-10-CM) - Aortic valve stenosis, etiology of cardiac valve disease unspecified  I95.9 (ICD-10-CM) - Hypotension, unspecified hypotension type  R53.81 (ICD-10-CM) - Physical deconditioning  E87.6 (ICD-10-CM) - Hypokalemia  I10 (ICD-10-CM) - Essential hypertension    THERAPY DIAG:  Muscle weakness (generalized)  Unsteadiness on feet  Other abnormalities of gait and mobility  Rationale for Evaluation and Treatment: Rehabilitation  SUBJECTIVE:  SUBJECTIVE STATEMENT: Couldn't sleep at all last night. Pt accompanied by: self  PERTINENT HISTORY: hx of chronic diastolic heart failure, morbid obesity, OSA, NASH/cirrhosis, chronic thrombocytopenia, DM2, aortic stenosis, vasovagal presyncope, chronic atrial fibrillation not on anticoagulation. Fracture of R shoulder and partial tear/ scar tissue RTC  2019 (s/p bicepectomy)  PAIN:  Are you having pain? Yes: NPRS scale: 7; 4/10 Pain location: L shoulder; knee Pain description: achy, pain all the time Aggravating factors: unsure, hurts all the time Relieving factors: unsure, Salon pas cream with Lidocaine on knee helps  PRECAUTIONS: Fall and Other: morbid obesity, hx of low BP, cardiac issues ; 3# lifting restriction; a-fib.  05/23/2023:  Cardiac cath performed on 8/30 and not allowed to be immersed in water for 2 weeks.  No lifting/pushing/pulling with R arm.  RED FLAGS: None   WEIGHT BEARING RESTRICTIONS: No  FALLS: Has patient fallen in last 6 months? Yes. Number of falls 2  LIVING ENVIRONMENT: Lives with: lives with their spouse Lives in: House/apartment Stairs:  1 step into the home from garage Has following equipment at home: Dan Humphreys - 4 wheeled  PLOF:  Independent with household mobility with device and Independent with community mobility with device  PATIENT GOALS: Want to be able to get around better and do things in my house.  OBJECTIVE:    TODAY'S TREATMENT: 06/21/2023 Activity Comments  Vitals 113/64   with rollator:  343 ft, no standing rest breaks More forward flexed posture after 1:30  FTSTS:  15.69 sec with arms crossed at chest   Reviewed HEP           Access Code: [email or web address] URL: [email or web address] Date: 06/21/2023 Prepared by: [facility] - Outpatient  Rehab - [facility]  Exercises - Seated Pelvic Tilts  - 1 x daily - 7 x weekly - 3 sets - 5 reps - Seated Shoulder Rolls  - 1 x daily - 7 x weekly - 3 sets - 5 reps - Seated Long Arc Quad  - 1 x daily - 7 x weekly - 2-3 sets - 10 reps - Seated March  - 1 x daily - 7 x weekly - 2-3 sets - 10 reps - Supine Heel Slide  - 1 x daily - 4 x weekly - 1-2 sets - 10 reps - Supine Hip Abduction  - 1 x daily - 4 x weekly - 2 sets - 10 reps - Long Sitting Ankle Pumps  - 1 x daily - 7 x weekly - 2 sets - 10 reps - Small Range Straight Leg Raise  - 1 x daily - 4 x weekly - 2 sets - 10 reps - Standing Hip Abduction with Counter Support  - 1 x daily - 7 x weekly - 2 sets - 10 reps - Heel Toe Raises with Counter Support  - 1 x daily - 7 x weekly - 2 sets - 10 reps - Staggered Stance Forward Backward Weight Shift with Counter Support  - 1 x daily - 7 x weekly - 2 sets - 10 reps - Sit to Stand  - 2-3 x daily - 7 x weekly - 1 sets - 5 reps      PATIENT EDUCATION: Education details: Posture with gait/avoiding heavy lean on rollator; 3 sec hold  for seated ex, updated HEP and provided access code discussed pt's goals (would like to continue therapy, but would need to be on hold due to upcoming procedure), Person educated: Patient Education method: Explanation, Demonstration, and Handouts Education  comprehension: verbalized understanding, returned demonstration,  and needs further education    --------------------------------------------------------------------------------- Objective measures below taken at initial evaluation:  DIAGNOSTIC FINDINGS: NA  COGNITION: Overall cognitive status: Within functional limits for tasks assessed   SENSATION: Light touch: WFL  EDEMA:  BLE edema + 1 pitting (has compression stockings, but does not use often)  POSTURE: rounded shoulders and forward head  LOWER EXTREMITY ROM:   A/ROM WFL in sitting (some limitations due to body habitus)   LOWER EXTREMITY MMT:  Grossly tested 4/5 except knee extension L 3+/5   VITALS: BP 104/56 HR 69 O2 sats:  93>97%  TRANSFERS: Assistive device utilized: None  Sit to stand: SBA and uses BUE support Stand to sit: SBA and BUE support  GAIT: Gait pattern: step through pattern, antalgic, and wide BOS Distance walked: 50 ft Assistive device utilized: Environmental consultant - 4 wheeled Level of assistance: SBA Comments: Fatigues and has to stop gait to sit after 85 ft  FUNCTIONAL TESTS:  2 minute walk test: 85 ft; sits at 1 minute, in addition to 1 standing rest break 10 meter walk test: 21.03 sec = 1.56 ft/sec 5x sit to stand:  with UE support, 24.82 sec  TODAY'S TREATMENT:                                                                                                                              DATE: 04/27/2023    PATIENT EDUCATION: Education details: PT eval results, POC Person educated: Patient Education method: Explanation Education comprehension: verbalized understanding  HOME EXERCISE PROGRAM: Not yet initiated  GOALS: Goals reviewed with patient? Yes  SHORT TERM GOALS: Target date: 05/26/2023  Pt will be independent with HEP for improved strength, balance, gait, decreased pain. Baseline: Goal status: MET  2.  Pt will improve 5x sit<>stand to less than or equal to 20 sec to demonstrate improved functional strength and transfer efficiency. Baseline: 24.82  sec; (05/18/23) 14 sec Goal status: MET  3. Pt will ambulate at least 115 in 2 MWT for improved gait efficiency in home. Baseline: 85 ft. With one sitting break after 1 minute; (05/18/23) 275 ft Goal status: MET  LONG TERM GOALS: Target date: 06/23/2023  Pt will be independent with progression of HEP for improved strength, balance, gait. Baseline:  Goal status: MET 06/21/2023  2.  Pt will improve 5x sit<>stand to less than or equal to 12 sec to demonstrate improved functional strength and transfer efficiency. Baseline: 24.82 sec >14 sec; 15.69 sec arms crossed at chest Goal status: IN PROGRESS 06/21/2023  3.  Pt will improve gait velocity to at least 2 ft/sec for improved gait efficiency and safety. Baseline: 1.56 ft/sec>2.54 ft/sec 06/19/2023 Goal status: GOAL MET 06/19/2023  4.  Pt will ambulate at least 325 ft in 2 MWT for improved gait efficiency in home environment. Baseline:  343 ft in 2 MWT 06/21/2023 Goal status: MET 06/21/2023  5.  Pt will verbalize ongoing plans for  continued community fitness upon d/c from PT to maximize gains made in PT.  Baseline: pt to return to PT after procedure scheduled for 06/30/2023  Goal status:  IN PROGRESS   ASSESSMENT:     CLINICAL IMPRESSION: Assessed LTGs, with pt meeting LTG 1, 3, 4.  She has progressed from initial FTSTS score, but just not to goal (LTG 2) level.  LTG 5 is ongoing.  Pt reports she would like to continue with PT to more fully address balance, as she is at fall risk with Berg score of 37/56.  She is to have a cardiac procedure next week, so discussed that patient will need resume PT orders and re-eval/recert upon return to PT.   OBJECTIVE IMPAIRMENTS: Abnormal gait, cardiopulmonary status limiting activity, decreased activity tolerance, decreased balance, decreased endurance, decreased mobility, difficulty walking, decreased strength, postural dysfunction, and pain.   ACTIVITY LIMITATIONS: carrying, lifting, bending, standing,  transfers, reach over head, hygiene/grooming, and locomotion level  PARTICIPATION LIMITATIONS: meal prep, cleaning, laundry, and community activity  PERSONAL FACTORS: 3+ comorbidities: significant medical history (see above)  are also affecting patient's functional outcome.   REHAB POTENTIAL: Good  CLINICAL DECISION MAKING: Evolving/moderate complexity  EVALUATION COMPLEXITY: Moderate  PLAN:  PT FREQUENCY: 2x/week  PT DURATION: 8 weeks plus eval (*will continue 2x/wk in clinic, per pt request, 05/23/2023)  PLANNED INTERVENTIONS: Therapeutic exercises, Therapeutic activity, Neuromuscular re-education, Balance training, Gait training, Patient/Family education, Self Care, Aquatic Therapy, and Manual therapy  PLAN FOR NEXT SESSION:  Pt on hold until resume orders following her cardiac procedure.  If pt able to return in 4-6 weeks, will re-eval and recert.  If not, will discharge.  Gean Maidens., PT 06/21/2023, 8:46 AM  [facility] at [facility] [address] [place], [address] [place], [place] [address] Phone # [phone] Fax # [phone]

## 2023-06-22 DIAGNOSIS — M62838 Other muscle spasm: Secondary | ICD-10-CM | POA: Diagnosis not present

## 2023-06-22 DIAGNOSIS — L03311 Cellulitis of abdominal wall: Secondary | ICD-10-CM | POA: Diagnosis not present

## 2023-06-26 ENCOUNTER — Other Ambulatory Visit (HOSPITAL_COMMUNITY): Payer: Self-pay | Admitting: Student

## 2023-06-26 DIAGNOSIS — K766 Portal hypertension: Secondary | ICD-10-CM

## 2023-06-28 ENCOUNTER — Other Ambulatory Visit: Payer: Self-pay

## 2023-06-28 ENCOUNTER — Encounter (HOSPITAL_COMMUNITY): Payer: Self-pay | Admitting: Interventional Radiology

## 2023-06-28 NOTE — Progress Notes (Addendum)
PCP - Darrow Bussing, MD  Cardiologist - Chilton Si, MD  HF Cardiologist Dr. Gasper Lloyd   PPM/ICD - denies Device Orders - n/a Rep Notified - n/a  Chest x-ray - 03-09-23 EKG - 04-28-23 Stress Test -  ECHO - 03-07-23 Cardiac Cath - 05-19-23  CPAP - uses nightly   Fasting Blood Sugar - per patient blood sugar around 110-115  Per Patient Last A1c 4.7 Checks Blood Sugar daily   Blood Thinner Instructions: denies Aspirin Instructions: n/a  ERAS Protcol - NPO  COVID TEST- n/a  Anesthesia review: yes Hx. Of A fib, CHF, OSA. Elevated labs on  06-01-23 PT/INR 16.5 1.3  Does have orders to check labs on DOS. Also currently taking antibiotics for cellulitis to stomach taking Doxycyline 100 mg Bid Per patient last dose will be Friday am but not going to take it so she can take her digoxin.  Reports stomach is much "better"   Patient verbally denies any shortness of breath, fever, cough and chest pain during phone call   -------------  SDW INSTRUCTIONS given:  Your procedure is scheduled on June 30, 2023.  Report to [facility] Main Entrance "A" at 5:30 A.M., and check in at the Admitting office.  Call this number if you have problems the morning of surgery:  [phone]   Remember:  Do not eat  or drink after midnight the night before your surgery     Take these medicines the morning of surgery with A SIP OF WATER  digoxin (LANOXIN)  diltiazem (CARDIZEM CD  lactulose (CHRONULAC) metoprolol tartrate (LOPRESSOR)  midodrine (PROAMATINE)     IF NEEDED albuterol (VENTOLIN HFA) 108 (90 Base)    As of today, STOP taking any Aspirin (unless otherwise instructed by your surgeon) Aleve, Naproxen, Ibuprofen, Motrin, Advil, Goody's, BC's, all herbal medications, fish oil, and all vitamins.                      Do not wear jewelry, make up, or nail polish            Do not wear lotions, powders, perfumes/colognes, or deodorant.            Do not shave 48 hours prior to surgery.   Men may shave face and neck.            Do not bring valuables to the hospital.            [facility] is not responsible for any belongings or valuables.  Do NOT Smoke (Tobacco/Vaping) 24 hours prior to your procedure If you use a CPAP at night, you may bring all equipment for your overnight stay.   Contacts, glasses, dentures or bridgework may not be worn into surgery.      For patients admitted to the hospital, discharge time will be determined by your treatment team.   Patients discharged the day of surgery will not be allowed to drive home, and someone needs to stay with them for 24 hours.    Special instructions:   [place]- Preparing For Surgery  Before surgery, you can play an important role. Because skin is not sterile, your skin needs to be as free of germs as possible. You can reduce the number of germs on your skin by washing with CHG (chlorahexidine gluconate) Soap before surgery.  CHG is an antiseptic cleaner which kills germs and bonds with the skin to continue killing germs even after washing.    Oral Hygiene is also  important to reduce your risk of infection.  Remember - BRUSH YOUR TEETH THE MORNING OF SURGERY WITH YOUR REGULAR TOOTHPASTE  Please do not use if you have an allergy to CHG or antibacterial soaps. If your skin becomes reddened/irritated stop using the CHG.  Do not shave (including legs and underarms) for at least 48 hours prior to first CHG shower. It is OK to shave your face.  Please follow these instructions carefully.   Shower the NIGHT BEFORE SURGERY and the MORNING OF SURGERY with DIAL Soap.   Pat yourself dry with a CLEAN TOWEL.  Wear CLEAN PAJAMAS to bed the night before surgery  Place CLEAN SHEETS on your bed the night of your first shower and DO NOT SLEEP WITH PETS.   Day of Surgery: Please shower morning of surgery  Wear Clean/Comfortable clothing the morning of surgery Do not apply any deodorants/lotions.   Remember to brush your  teeth WITH YOUR REGULAR TOOTHPASTE.   Questions were answered. Patient verbalized understanding of instructions.

## 2023-06-28 NOTE — Progress Notes (Signed)
Anesthesia Chart Review: Hannah Khan  Case: [id number] Date/Time: 06/30/23 0745   Procedure: TRANS-JUGULAR INTRAHEPATIC PORTAL SHUNT (TIPS)   Anesthesia type: General   Pre-op diagnosis:      Portal hypertension and NASH     K75.81, K74.60   [facility]   Surgeons: Bennie Dallas, MD       DISCUSSION:  FROM 11/18/22 Anesthesia APP Chart Review:  "Patient is a 67 year old female scheduled for the above procedure. She was diagnosed with NASH cirrhosis in 2009. Child Pugh B, MELD 15.  Her PLT count is primarily in the 20-40K range. One prior episode of significant hepatic encephalopathy, but overall controlled on lactulose and rifaximin. She has not experienced an upper of GI hemorrhage, but recent CT demonstrated "multiple portosystemic shunts including gastrorenal and an ectopic SMV-->IVC varix, and confirmed suspicion of main portal vein chronic thrombus seen on 2021 CT.  She has very large gastroesophageal varices.  Her history of encephalopathy and hyperbilirubinemia is compatible with portosystemic shunt syndrome." She was referred to IR for TIPS consideration.    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 She also had suspected autoimmune ITP ~ 2010. PLT count has been < 100K since at least 2012 and has been primarily in the 20K-40K since 2016. She had  intermittently been given Nplate and/or platelets prior to procedures. For 2018 breast lumpectomy 1 unit of platelet only raised her PLT count from 25K to 30K, so she was given weekly Nplate with improvement to 70K. For 2022 shoulder arthroscopy she was transfused 1 unit platelets preoperatively for PLT count of 27K. (They did not check a post-transfusion PLT count. EBL was minimal.) Last visit with Dr. Clelia Croft was on 07/14/20 with stable platelet count without active bleeding, so no intervention required, but noted, "Platelet transfusion or growth factor support would be needed if active bleeding is noted or surgical procedure is contemplated."   [facility] admission 11/12/22 - 10/2722 for abdominal wall cellulitis which improved with IV antibiotics, and discharged home on Duricef. Blood cultures negative x2. Known chronic afib but with RVR initially. IV Digoxin given. Rate controlled on metoprolol and Cardizem by discharge. (Not on anticoagulation due to liver disease and thrombocytopenia, and she is not a candidate for a Watchman device.) Hospital notes did not suggest a concern for active pneumonia. I communicated recent admission to Dr. Elby Showers.    Last cardiology evaluation was on 11/03/22 by Gillian Shields, NP. Preop clearance - Upcoming TIPS with Dr. Elby Showers. According to the Revised Cardiac Risk Index (RCRI), her Perioperative Risk of Major Cardiac Event is (%): 0.9. Her Functional Capacity in METs is: 4.73 according to the [employer] Activity Status Index (DASI). Per AHA/ACC guidelines, she is deemed acceptable risk for the planned procedure without additional cardiovascular testing. Will route to surgical team so they are aware.     Last dose of Ozempic reported as 11/10/22 and advised to hold until after procedure. Advised to hold Jardiance for 72 hours, but had already taken 11/17/22 dose.    Given  history, T&S added to pre-procedure lab orders. I also discussed with anesthesiologist [place], Earl Lites, DO.  Given previous hematology recommendations, he anticipated she may need preprocedure platelets, 1 or 2 units since she has not received Nplate. I communicated with Dr. Elby Showers, who will defer decision to the anesthesiologist, as he is fine with reserving platelet transfusion if > 20K since minimal EBL is anticipated. She will get repeat labs on the day of her procedure and decision regarding preprocedure platelets can be addressed at that time. We've asked her to arrive early at 6:00 AM. Blood Bank was also notified of potential need for platelets."   VS: Ht 5\' 7"  (1.702 m)   Wt (!) 146.8 kg   BMI 50.69 kg/m  Wt Readings from Last 3 Encounters:  06/15/23 (!) 146.8 kg  06/01/23 (!) 148.5 kg  05/19/23 (!) 152 kg   BP Readings from Last 3 Encounters:  06/15/23 110/60  06/01/23 122/64  05/19/23 122/68   Pulse Readings from Last 3 Encounters:  06/15/23 86  06/01/23 85  05/19/23 91     PROVIDERS: Koirala, Dibas, MD is PCP  Chilton Si, MD is cardiologist Willis Modena, MD is GI Eli Hose, MD is HEM-ONC. Last visit was in 2021, and recent he relocated and is no longer with [facility].   LABS: {CHL AN LABS REVIEWED:112001::"Labs reviewed: Acceptable for surgery."} (all labs ordered are listed, but only abnormal results are displayed)  Labs Reviewed - No data to display   IMAGES: CT Abd/pelvis 04/06/23: IMPRESSION: 1. No acute findings in the abdomen or pelvis. Specifically, no findings to explain the patient's history of abdominal pain with nausea and vomiting. 2. Cirrhotic liver morphology with sequelae of portal venous hypertension including splenomegaly and extensive collateralization in the upper abdomen with paraesophageal and gastric varices. 3. Chronic portal vein occlusion with cavernous transformation. 4. Tiny pleural effusions with mild basilar atelectasis. 5. Body wall edema. Skin thickening and subcutaneous stranding in the lower anterior abdominal wall  may be related to edema although cellulitis could have this appearance. 6.  Aortic Atherosclerosis (ICD10-I70.0).   1V PCXR 03/09/23: FINDINGS: Cardiac and mediastinal contours are unchanged. Newly visible right basilar opacity. No evidence of pneumothorax. IMPRESSION: Newly visible right basilar opacity which is likely due to a combination of atelectasis and small pleural effusion. Not well visualized on prior exam, likely due to overlying soft tissues.      EKG: EKG 04/28/23: Atrial fibrillation Left anterior fascicular block Septal infarct , age undetermined Abnormal ECG When compared with ECG of 06-Apr-2023 04:39, PREVIOUS ECG IS PRESENT No significant change since last tracing Confirmed by Armanda Magic ([id number]) on 04/29/2023 3:12:47 PM   CV: RHC 05/19/23: HEMODYNAMICS: RA:                  10 mmHg (mean) RV:                  34/10 mmHg PA:                  37/18 mmHg (28 mean) PCWP:            23 mmHg (mean)                                      Estimated Fick CO/CI   8.5 L/min, 3.4 L/min/m2 Thermodilution CO/CI   9 L/min, 3. 6 L/min/m2  TPG                 5  mmHg                                              PVR                 0.5 Wood Units  PAPi                2.4      IMPRESSION: Mildly elevated pre and postcapillary filling pressures Normal cardiac output/cardiac index Normal PVR and papi    Echo 03/07/23: IMPRESSIONS   1. Left ventricular ejection fraction, by estimation, is 55 to 60%. The  left ventricle has normal function. The left ventricle has no regional  wall motion abnormalities. Left ventricular diastolic function could not  be evaluated.   2. Right ventricular systolic function is normal. The right ventricular  size is normal. There is mildly elevated pulmonary artery systolic  pressure. The estimated right ventricular systolic pressure is 39.6 mmHg.   3. Left atrial size was severely dilated.   4.  Right atrial size was severely dilated.   5. The mitral valve is normal in structure. No evidence of mitral valve  regurgitation.   6. Tricuspid valve regurgitation is moderate.   7. The aortic valve is tricuspid. There is moderate calcification of the  aortic valve. There is moderate thickening of the aortic valve. Aortic  valve regurgitation is not visualized. Mild to moderate aortic valve  stenosis.   8. The inferior vena cava is dilated in size with <50% respiratory  variability, suggesting right atrial pressure of 15 mmHg.  - Comparison(s): No significant change from prior study. Prior images  reviewed side by side.    Past Medical History:  Diagnosis Date   Anemia    hx of   Aortic stenosis 02/2023   Echo 03/07/23 LVEF 55-60%, mild to moderate AS   Arthritis    knees   CHF (congestive heart failure) (HCC)    chronic diastolic   Diabetes mellitus without complication (HCC)    type 2   Dyspnea    Dysrhythmia    Fibromyalgia    Gastric varices    large   H/O transfusion of platelets    Heart murmur    never has caused any problems   Hx of colonic polyps    s/p partial colectomy   Hyperlipidemia    Hypertension    Joint pain    in knees and back spasms   Left leg swelling    wear compression hose   Liver cirrhosis secondary to NASH (nonalcoholic steatohepatitis) (HCC) dx nov 2014   had enlarged spleen also    Morbid obesity (HCC)    Persistent atrial fibrillation (HCC)    Pneumonia 10/2016   x 5   PONV (postoperative nausea and vomiting)    in past none recent   Portal hypertension (HCC)    Sleep apnea    uses cpap, setting varies between 14-20   Spleen enlarged    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 [facility])     Past Surgical History:  Procedure Laterality Date   BREAST LUMPECTOMY WITH RADIOACTIVE SEED LOCALIZATION Right 08/29/2017   Procedure: RIGHT BREAST LUMPECTOMY WITH RADIOACTIVE SEEDS LOCALIZATION;  Surgeon: Harriette Bouillon, MD;  Location: [facility] OR;   Service: General;  Laterality: Right;   CESAREAN SECTION  1989   CHOLECYSTECTOMY  20 yrs ago   COLECTOMY  2011   COLONOSCOPY     COLONOSCOPY N/A 09/05/2018   Procedure: COLONOSCOPY;  Surgeon: Willis Modena, MD;  Location: [facility];  Service: Endoscopy;  Laterality: N/A;   COLONOSCOPY WITH PROPOFOL Bilateral 05/04/2022   Procedure: COLONOSCOPY WITH PROPOFOL;  Surgeon: Willis Modena, MD;  Location: [facility];  Service: Gastroenterology;  Laterality: Bilateral;   DILATATION & CURETTAGE/HYSTEROSCOPY WITH MYOSURE N/A 05/06/2016   Procedure: DILATATION & CURETTAGE/HYSTEROSCOPY WITH MYOSURE WITH POLYPECTOMY;  Surgeon: Myna Hidalgo, DO;  Location: [facility];  Service: Gynecology;  Laterality: N/A;   ESOPHAGOGASTRODUODENOSCOPY (EGD) WITH PROPOFOL N/A 09/04/2013   Procedure: ESOPHAGOGASTRODUODENOSCOPY (EGD) WITH PROPOFOL;  Surgeon: Willis Modena, MD;  Location: [facility];  Service: Endoscopy;  Laterality: N/A;   ESOPHAGOGASTRODUODENOSCOPY (EGD) WITH PROPOFOL Bilateral 05/04/2022   Procedure: ESOPHAGOGASTRODUODENOSCOPY (EGD) WITH PROPOFOL;  Surgeon: Willis Modena, MD;  Location: [facility];  Service: Gastroenterology;  Laterality: Bilateral;   IR INTRAVASCULAR ULTRASOUND NON CORONARY  12/23/2022   IR RADIOLOGIST EVAL & MGMT  09/21/2022   IR RADIOLOGIST EVAL & MGMT  10/06/2022   IR RADIOLOGIST EVAL & MGMT  11/02/2022   IR RADIOLOGIST EVAL & MGMT  01/31/2023   IR RADIOLOGIST EVAL & MGMT  05/31/2023   IR TIPS  12/23/2022   IR US GUIDE VASC ACCESS RIGHT  12/23/2022   KNEE ARTHROSCOPY  yrs ago   bilateral, one done x 1, one done twice   POLYPECTOMY  09/05/2018   Procedure: POLYPECTOMY;  Surgeon: Willis Modena, MD;  Location: [facility];  Service: Endoscopy;;   RADIOLOGY WITH ANESTHESIA N/A 12/23/2022   Procedure: TIPS;  Surgeon: Bennie Dallas, MD;  Location: [facility];  Service: Radiology;  Laterality: N/A;   RIGHT HEART CATH N/A 05/19/2023   Procedure: RIGHT HEART CATH;  Surgeon: Dorthula Nettles,  DO;  Location: [facility];  Service: Cardiovascular;  Laterality: N/A;    MEDICATIONS: No current facility-administered medications for this encounter.    albuterol (VENTOLIN HFA) 108 (90 Base) MCG/ACT inhaler   digoxin (LANOXIN) 0.125 MG tablet   diltiazem (CARDIZEM CD) 180 MG 24 hr capsule   doxycycline (ADOXA) 100 MG tablet   lactulose (CHRONULAC) 10 GM/15ML solution   Lidocaine HCl-Benzyl Alcohol (SALONPAS LIDOCAINE PLUS) 4-10 % CREA   metolazone (ZAROXOLYN) 5 MG tablet   metoprolol tartrate (LOPRESSOR) 25 MG tablet   midodrine (PROAMATINE) 10 MG tablet   Multiple Vitamins-Minerals (PRESERVISION AREDS 2) CAPS   potassium chloride (MICRO-K) 10 MEQ CR capsule   Propylene Glycol (SYSTANE BALANCE) 0.6 % SOLN   torsemide (DEMADEX) 20 MG tablet   XIFAXAN 550 MG TABS tablet   Lancets (ONETOUCH DELICA PLUS LANCET33G) MISC   ONETOUCH VERIO test strip

## 2023-06-29 ENCOUNTER — Other Ambulatory Visit: Payer: Self-pay | Admitting: Radiology

## 2023-06-29 DIAGNOSIS — H25043 Posterior subcapsular polar age-related cataract, bilateral: Secondary | ICD-10-CM | POA: Diagnosis not present

## 2023-06-29 DIAGNOSIS — H18413 Arcus senilis, bilateral: Secondary | ICD-10-CM | POA: Diagnosis not present

## 2023-06-29 DIAGNOSIS — H2513 Age-related nuclear cataract, bilateral: Secondary | ICD-10-CM | POA: Diagnosis not present

## 2023-06-29 DIAGNOSIS — E113293 Type 2 diabetes mellitus with mild nonproliferative diabetic retinopathy without macular edema, bilateral: Secondary | ICD-10-CM | POA: Diagnosis not present

## 2023-06-29 DIAGNOSIS — H353221 Exudative age-related macular degeneration, left eye, with active choroidal neovascularization: Secondary | ICD-10-CM | POA: Diagnosis not present

## 2023-06-29 DIAGNOSIS — H353112 Nonexudative age-related macular degeneration, right eye, intermediate dry stage: Secondary | ICD-10-CM | POA: Diagnosis not present

## 2023-06-29 DIAGNOSIS — H2511 Age-related nuclear cataract, right eye: Secondary | ICD-10-CM | POA: Diagnosis not present

## 2023-06-29 NOTE — Anesthesia Preprocedure Evaluation (Addendum)
Anesthesia Evaluation  Patient identified by MRN, date of birth, ID band Patient awake    Reviewed: Allergy & Precautions, NPO status , Patient's Chart, lab work & pertinent test results  History of Anesthesia Complications (+) PONV and history of anesthetic complications  Airway Mallampati: II  TM Distance: >3 FB Neck ROM: Full    Dental  (+) Missing,    Pulmonary sleep apnea and Continuous Positive Airway Pressure Ventilation , former smoker   Pulmonary exam normal        Cardiovascular hypertension, Pt. on home beta blockers and Pt. on medications +CHF  Normal cardiovascular exam+ dysrhythmias Atrial Fibrillation   TTE 03/07/23: EF 55-60%, mild pHTN (RVSP 39.69mmHg), severe RAE/LAE, moderate TR, mild to moderate AS    Neuro/Psych negative neurological ROS     GI/Hepatic negative GI ROS,,,Portal hypertension and NASH   Endo/Other  diabetes, Type 2  Morbid obesity (BMI 51)  Renal/GU negative Renal ROS  negative genitourinary   Musculoskeletal  (+) Arthritis ,  Fibromyalgia -  Abdominal   Peds  Hematology negative hematology ROS (+) Plts 68k   Anesthesia Other Findings Day of surgery medications reviewed with patient.  Reproductive/Obstetrics                              Anesthesia Physical Anesthesia Plan  ASA: 4  Anesthesia Plan: General   Post-op Pain Management: Minimal or no pain anticipated   Induction: Intravenous  PONV Risk Score and Plan: 4 or greater and Treatment may vary due to age or medical condition, Dexamethasone, Ondansetron and Midazolam  Airway Management Planned: Oral ETT  Additional Equipment: Arterial line  Intra-op Plan:   Post-operative Plan: Extubation in OR  Informed Consent: I have reviewed the patients History and Physical, chart, labs and discussed the procedure including the risks, benefits and alternatives for the proposed anesthesia with the  patient or authorized representative who has indicated his/her understanding and acceptance.     Dental advisory given  Plan Discussed with: CRNA  Anesthesia Plan Comments: (PIV x2, arterial line.   RHC 05/19/23: HEMODYNAMICS: RA:                  10 mmHg (mean) RV:                  34/10 mmHg PA:                  37/18 mmHg (28 mean) PCWP:            23 mmHg (mean)                                      Estimated Fick CO/CI   8.5 L/min, 3.4 L/min/m2 Thermodilution CO/CI   9 L/min, 3. 6 L/min/m2                                                TPG                 5  mmHg  PVR                 0.5 Wood Units  PAPi                2.4      IMPRESSION: 1. Mildly elevated pre and postcapillary filling pressures 2. Normal cardiac output/cardiac index 3. Normal PVR and papi     Echo 03/07/23: IMPRESSIONS   1. Left ventricular ejection fraction, by estimation, is 55 to 60%. The  left ventricle has normal function. The left ventricle has no regional  wall motion abnormalities. Left ventricular diastolic function could not  be evaluated.   2. Right ventricular systolic function is normal. The right ventricular  size is normal. There is mildly elevated pulmonary artery systolic  pressure. The estimated right ventricular systolic pressure is 39.6 mmHg.   3. Left atrial size was severely dilated.   4. Right atrial size was severely dilated.   5. The mitral valve is normal in structure. No evidence of mitral valve  regurgitation.   6. Tricuspid valve regurgitation is moderate.   7. The aortic valve is tricuspid. There is moderate calcification of the  aortic valve. There is moderate thickening of the aortic valve. Aortic  valve regurgitation is not visualized. Mild to moderate aortic valve  stenosis.   8. The inferior vena cava is dilated in size with <50% respiratory  variability, suggesting right atrial pressure of 15 mmHg.  - Comparison(s):  No significant change from prior study. Prior images  reviewed side by side.    )        Anesthesia Quick Evaluation

## 2023-06-29 NOTE — H&P (Signed)
Chief Complaint: NASH Cirrhosis. Patient presents for transjugular intrahepatic portosystem shunt creation, possible transsplenic portal vein recanalization, and variceal embolization including coil embolization of the gastrorenal shunt near the confluence with the left renal vein, and the SMV-->IVC varix, in addition to possibly providing sclerotherapy to the gastroesophageal varices. And PICC line placement for IV access  Referring Physician(s): Dr. Dulce Sellar  Supervising Physician: Marliss Coots  Patient Status: [name] - Out-pt  History of Present Illness: Hannah Khan is a 67 y.o. female 67 y.o. female outpatient. Known to IR. History of HTN. HLD, DM, CHF, NASH cirrhosis. One remote episode of hepatic encephalopathy requiring hospitalization. . Found to have gastroesophogeal varices during endoscopy. On 4.5.24 IR attempted a transsplenic portal venous recanalization and vatical embolization however the procedure was aborted due the patient' right atrial pressure. The Patient has since successful lost weight and successful had a right heart catheterization on 8.30.24. The Patient was seen via telehealth for follow up consultation in the Portal Hypert[facility] Clinic on 9.11.23 with IR Attending Dr. Marliss Coots. [name] clinical picture was discussed. After frank evaluation regarding  treatment options and possible outcomes the patent decided to re-attempt the transjuguar intrahepatic portosystem shunt creation, possible transsplenic portal vein recanalization, and variceal embolization including coil embolization of the gastrorenal shunt near the confluence with the left renal vein, and the SMV-->IVC varix, in addition to possibly providing sclerotherapy to the gastroesophageal varices. Patient presents for procedure with general anesthesia.   Husband at bedside.Currently without any significant complaints. Patient alert and laying in bed,calm. Endorses flatulency she states is related to the  lactulose. Denies any fevers, headache, chest pain, SOB, cough, abdominal pain, nausea, vomiting or bleeding. Return precautions and treatment recommendations and follow-up discussed with the patient and her husband. Both who are agreeable with the plan.     Past Medical History:  Diagnosis Date   Anemia    hx of   Aortic stenosis 02/2023   Echo 03/07/23 LVEF 55-60%, mild to moderate AS   Arthritis    knees   CHF (congestive heart failure) (HCC)    chronic diastolic   Diabetes mellitus without complication (HCC)    type 2   Dyspnea    Dysrhythmia    Fibromyalgia    Gastric varices    large   H/O transfusion of platelets    Heart murmur    never has caused any problems   Hx of colonic polyps    s/p partial colectomy   Hyperlipidemia    Hypertension    Joint pain    in knees and back spasms   Left leg swelling    wear compression hose   Liver cirrhosis secondary to NASH (nonalcoholic steatohepatitis) (HCC) dx nov 2014   had enlarged spleen also    Morbid obesity (HCC)    Persistent atrial fibrillation (HCC)    Pneumonia 10/2016   x 5   PONV (postoperative nausea and vomiting)    in past none recent   Portal hypertension (HCC)    Sleep apnea    uses cpap, setting varies between 14-20   Spleen enlarged    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 [facility])     Past Surgical History:  Procedure Laterality Date   BREAST LUMPECTOMY WITH RADIOACTIVE SEED LOCALIZATION Right 08/29/2017   Procedure: RIGHT BREAST LUMPECTOMY WITH RADIOACTIVE SEEDS LOCALIZATION;  Surgeon: Harriette Bouillon, MD;  Location: [facility] OR;  Service: General;  Laterality: Right;   CESAREAN SECTION  1989   CHOLECYSTECTOMY  20 yrs  ago   COLECTOMY  2011   COLONOSCOPY     COLONOSCOPY N/A 09/05/2018   Procedure: COLONOSCOPY;  Surgeon: Willis Modena, MD;  Location: [facility] ENDOSCOPY;  Service: Endoscopy;  Laterality: N/A;   COLONOSCOPY WITH PROPOFOL Bilateral 05/04/2022   Procedure: COLONOSCOPY WITH PROPOFOL;  Surgeon:  Willis Modena, MD;  Location: [facility] ENDOS[facility];  Service: Gastroenterology;  Laterality: Bilateral;   DILATATION & CURETTAGE/HYSTEROSCOPY WITH MYOSURE N/A 05/06/2016   Procedure: DILATATION & CURETTAGE/HYSTEROSCOPY WITH MYOSURE WITH POLYPECTOMY;  Surgeon: Myna Hidalgo, DO;  Location: [facility] ORS;  Service: Gynecology;  Laterality: N/A;   ESOPHAGOGASTRODUODENOSCOPY (EGD) WITH PROPOFOL N/A 09/04/2013   Procedure: ESOPHAGOGASTRODUODENOSCOPY (EGD) WITH PROPOFOL;  Surgeon: Willis Modena, MD;  Location: [facility];  Service: Endoscopy;  Laterality: N/A;   ESOPHAGOGASTRODUODENOSCOPY (EGD) WITH PROPOFOL Bilateral 05/04/2022   Procedure: ESOPHAGOGASTRODUODENOSCOPY (EGD) WITH PROPOFOL;  Surgeon: Willis Modena, MD;  Location: [facility];  Service: Gastroenterology;  Laterality: Bilateral;   IR INTRAVASCULAR ULTRASOUND NON CORONARY  12/23/2022   IR RADIOLOGIST EVAL & MGMT  09/21/2022   IR RADIOLOGIST EVAL & MGMT  10/06/2022   IR RADIOLOGIST EVAL & MGMT  11/02/2022   IR RADIOLOGIST EVAL & MGMT  01/31/2023   IR RADIOLOGIST EVAL & MGMT  05/31/2023   IR TIPS  12/23/2022   IR US GUIDE VASC ACCESS RIGHT  12/23/2022   KNEE ARTHROSCOPY  yrs ago   bilateral, one done x 1, one done twice   POLYPECTOMY  09/05/2018   Procedure: POLYPECTOMY;  Surgeon: Willis Modena, MD;  Location: [facility];  Service: Endoscopy;;   RADIOLOGY WITH ANESTHESIA N/A 12/23/2022   Procedure: TIPS;  Surgeon: Bennie Dallas, MD;  Location: [facility] OR;  Service: Radiology;  Laterality: N/A;   RIGHT HEART CATH N/A 05/19/2023   Procedure: RIGHT HEART CATH;  Surgeon: Dorthula Nettles, DO;  Location: [facility] INV[facility]IVE CV [facility];  Service: Cardiovascular;  Laterality: N/A;    Allergies: Celebrex [celecoxib], Shellfish allergy, Barium-containing compounds, Prednisone, Statins, Fentanyl, Ibuprofen, Sglt2 inhibitors, Tramadol, Tylenol [acetaminophen], and Zetia [ezetimibe]  Medications: Prior to Admission medications   Medication Sig Start Date End Date Taking?  Authorizing Provider  albuterol (VENTOLIN HFA) 108 (90 Base) MCG/ACT inhaler Inhale 1-2 puffs into the lungs every 6 (six) hours as needed for wheezing or shortness of breath.    [provider]  digoxin (LANOXIN) 0.125 MG tablet Take 1 tablet (0.125 mg total) by mouth daily. 04/28/23   Sabharwal, Aditya, DO  diltiazem (CARDIZEM CD) 180 MG 24 hr capsule Take 1 capsule (180 mg total) by mouth daily. 04/18/23   Alver Sorrow, NP  doxycycline (ADOXA) 100 MG tablet Take 100 mg by mouth 2 (two) times daily. Per patient last dose will be Friday 06-30-23    [provider]  lactulose (CHRONULAC) 10 GM/15ML solution Take 20 g by mouth 3 (three) times daily. 03/17/21   [provider]  Lancets (ONETOUCH DELICA PLUS [place]) MISC Apply topically 3 (three) times daily. 04/10/20   [provider]  Lidocaine HCl-Benzyl Alcohol (SALONPAS LIDOCAINE PLUS) 4-10 % CREA Apply 1 Application topically daily as needed (knee pain).    [provider]  metolazone (ZAROXOLYN) 5 MG tablet Take 1 tablet (5 mg total) by mouth once a week. Take one tablet on Sundays 30 minutes before Bumex 02/28/23   Alver Sorrow, NP  metoprolol tartrate (LOPRESSOR) 25 MG tablet Take 0.5 tablets (12.5 mg total) by mouth 2 (two) times daily. 05/08/23   Jacklynn Ganong, FNP  midodrine (PROAMATINE) 10 MG  tablet Take 1 tablet (10 mg total) by mouth 3 (three) times daily. 04/18/23   Alver Sorrow, NP  Multiple Vitamins-Minerals (PRESERVISION AREDS 2) CAPS Take 1 capsule by mouth 2 (two) times daily.    [provider]  [facility] VERIO test strip 1 each 3 (three) times daily. 04/13/20   [provider]  potassium chloride (MICRO-K) 10 MEQ CR capsule Take 10 capsules (100 mEq total) by mouth 2 (two) times daily. Take an extra 4 tablets when taking Metolazone. 06/15/23   Andrey Farmer, PA-C  Propylene Glycol (SYSTANE BALANCE) 0.6 % SOLN Place 1 drop into both eyes 2 (two) times  daily.    [provider]  torsemide (DEMADEX) 20 MG tablet Take 4 tablets (80 mg total) by mouth every morning AND 3 tablets (60 mg total) every evening. 06/01/23   Alen Bleacher, NP  XIFAXAN 550 MG TABS tablet Take 550 mg by mouth 2 (two) times daily. 03/13/21   [provider]     Family History  Problem Relation Age of Onset   Heart failure Mother    Cancer Mother    Hyperlipidemia Mother    Congestive Heart Failure Mother    Stroke Mother    Lung cancer Father    Cancer Father    Cancer Brother    Hyperlipidemia Brother    Stroke Other    Breast cancer Neg Hx     Social History   Socioeconomic History   Marital status: Married    Spouse name: Not on file   Number of children: Not on file   Years of education: Not on file   Highest education level: Not on file  Occupational History   Occupation: Scientist, product/process development  Tobacco Use   Smoking status: Former    Current packs/day: 0.00    Average packs/day: 1 pack/day for 3.0 years (3.0 ttl pk-yrs)    Types: Cigarettes    Start date: 09/20/1971    Quit date: 09/19/1974    Years since quitting: 48.8   Smokeless tobacco: Never  Vaping Use   Vaping status: Never Used  Substance and Sexual Activity   Alcohol use: Not Currently    Comment: occ   Drug use: No   Sexual activity: Not on file  Other Topics Concern   Not on file  Social History Narrative   Lives in [place] with her husband.  Instructor for early childhood development.     Social Determinants of Health   Financial Resource Strain: Not on file  Food Insecurity: No Food Insecurity (04/08/2023)   Hunger Vital Sign    Worried About Running Out of Food in the Last Year: Never true    Ran Out of Food in the Last Year: Never true  Transportation Needs: No Transportation Needs (04/08/2023)   PRAPARE - Administrator, Civil Service (Medical): No    Lack of Transportation (Non-Medical): No  Physical Activity: Not on file  Stress: Not on file   Social Connections: Unknown (01/31/2022)   Received from [facility]   Social Network    Social Network: Not on file     Review of Systems: A 12 point ROS discussed and pertinent positives are indicated in the HPI above.  All other systems are negative.  Review of Systems  Constitutional:  Negative for fatigue and fever.  HENT:  Negative for congestion.   Respiratory:  Negative for cough and shortness of breath.   Gastrointestinal:  Negative for abdominal pain, diarrhea, nausea and vomiting.    Vital Signs: B/P 127.52, HR 86, rr 18, temp 98.1  Advance Care Plan: The advanced care plan/surrogate decision maker was discussed at the time of visit and documented in the medical record. Spouse Terry   Physical Exam Vitals and nursing note reviewed.  Constitutional:      Appearance: She is well-developed. She is obese.  HENT:     Head: Normocephalic and atraumatic.  Eyes:     Conjunctiva/sclera: Conjunctivae normal.  Cardiovascular:     Rate and Rhythm: Normal rate and regular rhythm.     Heart sounds: Normal heart sounds.  Pulmonary:     Effort: Pulmonary effort is normal.     Breath sounds: Normal breath sounds.  Musculoskeletal:        General: Normal range of motion.     Cervical back: Normal range of motion.  Skin:    General: Skin is warm and dry.  Neurological:     General: No focal deficit present.     Mental Status: She is alert and oriented to person, place, and time. Mental status is at baseline.  Psychiatric:        Mood and Affect: Mood normal.        Behavior: Behavior normal.        Thought Content: Thought content normal.        Judgment: Judgment normal.     Imaging: IR Radiologist Eval & Mgmt  Result Date: 05/31/2023 EXAM: ESTABLISHED PATIENT OFFICE VISIT CHIEF COMPLAINT: See Epic note. HISTORY OF PRESENT ILLNESS: See Epic note. REVIEW OF SYSTEMS: See Epic note. PHYSICAL EXAMINATION: See Epic note. ASSESSMENT AND PLAN: See Epic note.  Marliss Coots, MD [facility] Radiology Electronically Signed   By: Marliss Coots M.D.   On: 05/31/2023 16:29    Labs:  CBC: Recent Labs    04/07/23 0559 05/08/23 1703 05/19/23 1302 06/01/23 1120 06/30/23 0549  WBC 2.5* 4.6  --  6.0 5.4  HGB 10.2* 12.0 10.5*  10.5* 12.1 11.6*  HCT 31.0* 36.8 31.0*  31.0* 35.5* 35.0*  PLT 23* 46*  --  57* 68*    COAGS: Recent Labs    03/06/23 0104 03/07/23 0059 06/01/23 1120 06/30/23 0549  INR 1.3* 1.4* 1.3* 1.2    BMP: Recent Labs    06/06/23 1205 06/09/23 1238 06/15/23 1005 06/30/23 0549  NA 136 136 135 140  K 3.0* 3.9 4.5 3.0*  CL 97* 102 103 102  CO2 27 27 22 23   GLUCOSE 149* 145* 170* 137*  BUN 22 23 22 15   CALCIUM 8.7* 8.4* 8.7* 9.0  CREATININE 1.56* 1.62* 1.48* 1.39*  GFRNONAA 36* 35* 39* 42*    LIVER FUNCTION TESTS: Recent Labs    04/06/23 0354 04/07/23 0559 06/01/23 1120 06/30/23 0549  BILITOT 6.4* 4.9* 4.4* 3.9*  AST 53* 53* 73* 55*  ALT 26 28 36 32  ALKPHOS 87 77 104 95  PROT 6.2* 5.7* 5.9* 6.6  ALBUMIN 2.8* 2.5* 2.2* 2.6*     Assessment and Plan:  67 y.o. female outpatient. Known to IR. History of HTN. HLD, DM, CHF, NASH cirrhosis. One remote episode of hepatic encephalopathy requiring hospitalization. Found to have gastroesophogeal varices during endoscopy. On 4.5.24 IR attempted a transsplenic portal venous recanalization and vatical embolization however the procedure was aborted due the patient' right atrial pressure. The Patient has since successful lost weight and successful had a right heart catheterization on 8.30.24.  The Patient was seen via telehealth for follow up consultation in the Portal Hypertension Clinic on 9.11.23 with IR Attending Dr. Marliss Coots. [name] clinical picture was discussed. After frank evaluation regarding  treatment options and possible outcomes the patent decided to re-attempt the transjuguar intrahepatic portosystem  shunt creation, possible transsplenic portal vein recanalization, and variceal embolization including coil embolization of the gastrorenal shunt near the confluence with the left renal vein, and the SMV-->IVC varix, in addition to possibly providing sclerotherapy to the gastroesophageal varices. Patient presents for procedure with general anesthesia.   MELD 20 Child's Pugh B. CT abd pelvis from 7.18.24 reads Cirrhotic liver morphology with sequelae of portal venous hypertension including splenomegaly and extensive collateralization. Chronic portal vein occlusion with cavernous transformation. PLT 68, potassium 3.0, total bilirubin 3.9, Albumin 2.6, AST 55. All medications are within acceptable parameters. See list of allergies for pertinent allergies. Patient has been NPO since midnight.   Risks and benefits of TIPS, BRTO and/or additional variceal embolization were discussed with the patient and/or the patient's family including, but not limited to, infection, bleeding, damage to adjacent structures, worsening hepatic and/or cardiac function, worsening and/or the development of altered mental status/encephalopathy, non-target embolization and death.   This interventional procedure involves the use of X-rays and because of the nature of the planned procedure, it is possible that we will have prolonged use of X-ray fluoroscopy.  Potential radiation risks to you include (but are not limited to) the following: - A slightly elevated risk for cancer  several years later in life. This risk is typically less than 0.5% percent. This risk is low in comparison to the normal incidence of human cancer, which is 33% for women and 50% for men according to the American Cancer Society. - Radiation induced injury can include skin redness, resembling a rash, tissue breakdown / ulcers and hair loss (which can be temporary or permanent).   The likelihood of either of these occurring depends on the difficulty of the  procedure and whether you are sensitive to radiation due to previous procedures, disease, or genetic conditions.   IF your procedure requires a prolonged use of radiation, you will be notified and given written instructions for further action.  It is your responsibility to monitor the irradiated area for the 2 weeks following the procedure and to notify your physician if you are concerned that you have suffered a radiation induced injury.    All of the patient's and her spouse's questions were answered, patient is agreeable to proceed.  Consent signed and in chart.     Thank you for this interesting consult.  I greatly enjoyed meeting Hannah Khan and look forward to participating in their care.  A copy of this report was sent to the requesting provider on this date.  Electronically Signed: Alene Mires, NP 06/30/2023, 8:04 AM   I spent a total of   25 Minutes in face to face in clinical consultation, greater than 50% of which was counseling/coordinating care for ans splenic portal vein recanalization, and variceal embolization including coil embolization of the gastrorenal shunt near the confluence with the left renal vein, and the SMV-->IVC varix, in addition to possibly providing sclerotherapy to the gastroesophageal varices.

## 2023-06-30 ENCOUNTER — Encounter (HOSPITAL_COMMUNITY)
Admission: RE | Disposition: A | Discharge status: Home / Self Care | Payer: Self-pay | Source: Home / Self Care | Attending: Interventional Radiology

## 2023-06-30 ENCOUNTER — Other Ambulatory Visit: Payer: Self-pay | Admitting: Radiology

## 2023-06-30 ENCOUNTER — Encounter (HOSPITAL_COMMUNITY): Payer: Self-pay | Admitting: Interventional Radiology

## 2023-06-30 ENCOUNTER — Inpatient Hospital Stay (HOSPITAL_COMMUNITY): Payer: Medicare PPO | Admitting: Vascular Surgery

## 2023-06-30 ENCOUNTER — Inpatient Hospital Stay (HOSPITAL_COMMUNITY)
Admission: RE | Admit: 2023-06-30 | Discharge: 2023-07-02 | DRG: 981 | Disposition: A | Discharge status: Home / Self Care | Payer: Medicare PPO | Attending: Interventional Radiology | Admitting: Interventional Radiology

## 2023-06-30 ENCOUNTER — Inpatient Hospital Stay (HOSPITAL_COMMUNITY)
Admission: RE | Admit: 2023-06-30 | Discharge: 2023-06-30 | Disposition: A | Discharge status: Home / Self Care | Payer: Medicare PPO | Source: Ambulatory Visit | Attending: Interventional Radiology | Admitting: Interventional Radiology

## 2023-06-30 ENCOUNTER — Other Ambulatory Visit: Payer: Self-pay

## 2023-06-30 DIAGNOSIS — D6959 Other secondary thrombocytopenia: Secondary | ICD-10-CM | POA: Diagnosis not present

## 2023-06-30 DIAGNOSIS — K7581 Nonalcoholic steatohepatitis (NASH): Secondary | ICD-10-CM

## 2023-06-30 DIAGNOSIS — E785 Hyperlipidemia, unspecified: Secondary | ICD-10-CM | POA: Diagnosis present

## 2023-06-30 DIAGNOSIS — I700 Atherosclerosis of aorta: Secondary | ICD-10-CM | POA: Diagnosis present

## 2023-06-30 DIAGNOSIS — I5032 Chronic diastolic (congestive) heart failure: Secondary | ICD-10-CM | POA: Diagnosis present

## 2023-06-30 DIAGNOSIS — Z87891 Personal history of nicotine dependence: Secondary | ICD-10-CM

## 2023-06-30 DIAGNOSIS — Z6841 Body Mass Index (BMI) 40.0 and over, adult: Secondary | ICD-10-CM

## 2023-06-30 DIAGNOSIS — Z83438 Family history of other disorder of lipoprotein metabolism and other lipidemia: Secondary | ICD-10-CM

## 2023-06-30 DIAGNOSIS — Z885 Allergy status to narcotic agent status: Secondary | ICD-10-CM

## 2023-06-30 DIAGNOSIS — I872 Venous insufficiency (chronic) (peripheral): Secondary | ICD-10-CM | POA: Diagnosis present

## 2023-06-30 DIAGNOSIS — K7460 Unspecified cirrhosis of liver: Secondary | ICD-10-CM

## 2023-06-30 DIAGNOSIS — K766 Portal hypertension: Secondary | ICD-10-CM | POA: Diagnosis not present

## 2023-06-30 DIAGNOSIS — R1011 Right upper quadrant pain: Secondary | ICD-10-CM | POA: Diagnosis not present

## 2023-06-30 DIAGNOSIS — R161 Splenomegaly, not elsewhere classified: Secondary | ICD-10-CM | POA: Diagnosis present

## 2023-06-30 DIAGNOSIS — M797 Fibromyalgia: Secondary | ICD-10-CM | POA: Diagnosis present

## 2023-06-30 DIAGNOSIS — E119 Type 2 diabetes mellitus without complications: Secondary | ICD-10-CM | POA: Diagnosis present

## 2023-06-30 DIAGNOSIS — Z886 Allergy status to analgesic agent status: Secondary | ICD-10-CM

## 2023-06-30 DIAGNOSIS — I959 Hypotension, unspecified: Secondary | ICD-10-CM | POA: Diagnosis not present

## 2023-06-30 DIAGNOSIS — K661 Hemoperitoneum: Secondary | ICD-10-CM | POA: Diagnosis not present

## 2023-06-30 DIAGNOSIS — I350 Nonrheumatic aortic (valve) stenosis: Secondary | ICD-10-CM | POA: Diagnosis present

## 2023-06-30 DIAGNOSIS — I81 Portal vein thrombosis: Secondary | ICD-10-CM | POA: Diagnosis present

## 2023-06-30 DIAGNOSIS — I517 Cardiomegaly: Secondary | ICD-10-CM | POA: Diagnosis not present

## 2023-06-30 DIAGNOSIS — R0602 Shortness of breath: Secondary | ICD-10-CM | POA: Diagnosis not present

## 2023-06-30 DIAGNOSIS — E876 Hypokalemia: Secondary | ICD-10-CM | POA: Diagnosis present

## 2023-06-30 DIAGNOSIS — Z8249 Family history of ischemic heart disease and other diseases of the circulatory system: Secondary | ICD-10-CM

## 2023-06-30 DIAGNOSIS — Z79899 Other long term (current) drug therapy: Secondary | ICD-10-CM

## 2023-06-30 DIAGNOSIS — K7469 Other cirrhosis of liver: Principal | ICD-10-CM | POA: Diagnosis present

## 2023-06-30 DIAGNOSIS — I110 Hypertensive heart disease with heart failure: Secondary | ICD-10-CM | POA: Diagnosis present

## 2023-06-30 DIAGNOSIS — I8510 Secondary esophageal varices without bleeding: Secondary | ICD-10-CM | POA: Diagnosis present

## 2023-06-30 DIAGNOSIS — Z91013 Allergy to seafood: Secondary | ICD-10-CM

## 2023-06-30 DIAGNOSIS — I864 Gastric varices: Secondary | ICD-10-CM | POA: Diagnosis present

## 2023-06-30 DIAGNOSIS — Z9049 Acquired absence of other specified parts of digestive tract: Secondary | ICD-10-CM

## 2023-06-30 DIAGNOSIS — R0989 Other specified symptoms and signs involving the circulatory and respiratory systems: Secondary | ICD-10-CM | POA: Diagnosis not present

## 2023-06-30 DIAGNOSIS — M170 Bilateral primary osteoarthritis of knee: Secondary | ICD-10-CM | POA: Diagnosis present

## 2023-06-30 DIAGNOSIS — G4730 Sleep apnea, unspecified: Secondary | ICD-10-CM | POA: Diagnosis present

## 2023-06-30 DIAGNOSIS — I4819 Other persistent atrial fibrillation: Secondary | ICD-10-CM | POA: Diagnosis present

## 2023-06-30 DIAGNOSIS — D696 Thrombocytopenia, unspecified: Principal | ICD-10-CM

## 2023-06-30 DIAGNOSIS — Z888 Allergy status to other drugs, medicaments and biological substances status: Secondary | ICD-10-CM

## 2023-06-30 HISTORY — PX: IR TIPS: IMG2295

## 2023-06-30 HISTORY — PX: IR US GUIDE VASC ACCESS RIGHT: IMG2390

## 2023-06-30 HISTORY — PX: IR US GUIDE VASC ACCESS LEFT: IMG2389

## 2023-06-30 HISTORY — PX: TIPS PROCEDURE: SHX808

## 2023-06-30 HISTORY — PX: IR EMBO ART  VEN HEMORR LYMPH EXTRAV  INC GUIDE ROADMAPPING: IMG5450

## 2023-06-30 LAB — COMPREHENSIVE METABOLIC PANEL
ALT: 32 U/L (ref 0–44)
AST: 55 U/L — ABNORMAL HIGH (ref 15–41)
Albumin: 2.6 g/dL — ABNORMAL LOW (ref 3.5–5.0)
Alkaline Phosphatase: 95 U/L (ref 38–126)
Anion gap: 15 (ref 5–15)
BUN: 15 mg/dL (ref 8–23)
CO2: 23 mmol/L (ref 22–32)
Calcium: 9 mg/dL (ref 8.9–10.3)
Chloride: 102 mmol/L (ref 98–111)
Creatinine, Ser: 1.39 mg/dL — ABNORMAL HIGH (ref 0.44–1.00)
GFR, Estimated: 42 mL/min — ABNORMAL LOW (ref >60)
Glucose, Bld: 137 mg/dL — ABNORMAL HIGH (ref 70–99)
Potassium: 3 mmol/L — ABNORMAL LOW (ref 3.5–5.1)
Sodium: 140 mmol/L (ref 135–145)
Total Bilirubin: 3.9 mg/dL — ABNORMAL HIGH (ref 0.3–1.2)
Total Protein: 6.6 g/dL (ref 6.5–8.1)

## 2023-06-30 LAB — CBC
HCT: 35 % — ABNORMAL LOW (ref 36.0–46.0)
HCT: 24.4 % — ABNORMAL LOW (ref 36.0–46.0)
Hemoglobin: 11.6 g/dL — ABNORMAL LOW (ref 12.0–15.0)
Hemoglobin: 8 g/dL — ABNORMAL LOW (ref 12.0–15.0)
MCH: 32.9 pg (ref 26.0–34.0)
MCH: 32.5 pg (ref 26.0–34.0)
MCHC: 33.1 g/dL (ref 30.0–36.0)
MCHC: 32.8 g/dL (ref 30.0–36.0)
MCV: 99.2 fL (ref 80.0–100.0)
MCV: 99.2 fL (ref 80.0–100.0)
Platelets: 68 10*3/uL — ABNORMAL LOW (ref 150–400)
Platelets: 66 10*3/uL — ABNORMAL LOW (ref 150–400)
RBC: 3.53 MIL/uL — ABNORMAL LOW (ref 3.87–5.11)
RBC: 2.46 MIL/uL — ABNORMAL LOW (ref 3.87–5.11)
RDW: 15.9 % — ABNORMAL HIGH (ref 11.5–15.5)
RDW: 15.9 % — ABNORMAL HIGH (ref 11.5–15.5)
WBC: 5.4 10*3/uL (ref 4.0–10.5)
WBC: 8.4 10*3/uL (ref 4.0–10.5)
nRBC: 0 % (ref 0.0–0.2)
nRBC: 0 % (ref 0.0–0.2)

## 2023-06-30 LAB — PROTIME-INR
INR: 1.2 (ref 0.8–1.2)
Prothrombin Time: 15.6 s — ABNORMAL HIGH (ref 11.4–15.2)

## 2023-06-30 LAB — GLUCOSE, CAPILLARY
Glucose-Capillary: 125 mg/dL — ABNORMAL HIGH (ref 70–99)
Glucose-Capillary: 163 mg/dL — ABNORMAL HIGH (ref 70–99)
Glucose-Capillary: 278 mg/dL — ABNORMAL HIGH (ref 70–99)

## 2023-06-30 LAB — SURGICAL PCR SCREEN
MRSA, PCR: NEGATIVE
Staphylococcus aureus: NEGATIVE

## 2023-06-30 SURGERY — INSERTION, SHUNT, INTRAHEPATIC PORTOSYSTEMIC, TRANSJUGULAR
Anesthesia: General

## 2023-06-30 MED ORDER — PROSIGHT PO TABS
1.0000 | ORAL_TABLET | Freq: Every day | ORAL | Status: DC
  Administered 2023-07-01 – 2023-07-02 (×2): 1 via ORAL
  Filled 2023-06-30 (×2): qty 1

## 2023-06-30 MED ORDER — MIDODRINE HCL 5 MG PO TABS
10.0000 mg | ORAL_TABLET | Freq: Once | ORAL | Status: AC
  Administered 2023-06-30: 10 mg via ORAL
  Filled 2023-06-30 (×3): qty 2

## 2023-06-30 MED ORDER — SODIUM TETRADECYL SULFATE 1 % IV SOLN: 1.0000 mL | Freq: Once | INTRAVENOUS | Status: DC

## 2023-06-30 MED ORDER — MIDAZOLAM HCL 2 MG/2ML IJ SOLN
INTRAMUSCULAR | Status: AC
  Filled 2023-06-30: qty 2

## 2023-06-30 MED ORDER — FENTANYL CITRATE (PF) 100 MCG/2ML IJ SOLN
INTRAMUSCULAR | Status: AC
  Filled 2023-06-30: qty 2

## 2023-06-30 MED ORDER — SUGAMMADEX SODIUM 200 MG/2ML IV SOLN
INTRAVENOUS | Status: DC | PRN
  Administered 2023-06-30: 400 mg via INTRAVENOUS

## 2023-06-30 MED ORDER — PROPYLENE GLYCOL 0.6 % OP SOLN: 1.0000 [drp] | Freq: Two times a day (BID) | OPHTHALMIC | Status: DC

## 2023-06-30 MED ORDER — METOPROLOL TARTRATE 12.5 MG HALF TABLET
12.5000 mg | ORAL_TABLET | Freq: Two times a day (BID) | ORAL | Status: DC
  Administered 2023-06-30 – 2023-07-02 (×4): 12.5 mg via ORAL
  Filled 2023-06-30 (×4): qty 1

## 2023-06-30 MED ORDER — PRESERVISION AREDS 2 PO CAPS: 1.0000 | ORAL_CAPSULE | Freq: Two times a day (BID) | ORAL | Status: DC

## 2023-06-30 MED ORDER — ORAL CARE MOUTH RINSE: 15.0000 mL | Freq: Once | OROMUCOSAL | Status: DC

## 2023-06-30 MED ORDER — GELATIN ABSORBABLE 12-7 MM EX MISC
CUTANEOUS | Status: AC
  Filled 2023-06-30: qty 2

## 2023-06-30 MED ORDER — TORSEMIDE 20 MG PO TABS
60.0000 mg | ORAL_TABLET | Freq: Every evening | ORAL | Status: DC
  Administered 2023-06-30 – 2023-07-01 (×2): 60 mg via ORAL
  Filled 2023-06-30 (×2): qty 3

## 2023-06-30 MED ORDER — DIGOXIN 125 MCG PO TABS
0.1250 mg | ORAL_TABLET | Freq: Every day | ORAL | Status: DC
  Administered 2023-07-01 – 2023-07-02 (×2): 0.125 mg via ORAL
  Filled 2023-06-30 (×2): qty 1

## 2023-06-30 MED ORDER — FENTANYL CITRATE (PF) 250 MCG/5ML IJ SOLN
INTRAMUSCULAR | Status: DC | PRN
  Administered 2023-06-30 (×2): 50 ug via INTRAVENOUS

## 2023-06-30 MED ORDER — POLYVINYL ALCOHOL 1.4 % OP SOLN
1.0000 [drp] | Freq: Two times a day (BID) | OPHTHALMIC | Status: DC
  Administered 2023-06-30 – 2023-07-02 (×4): 1 [drp] via OPHTHALMIC
  Filled 2023-06-30: qty 15

## 2023-06-30 MED ORDER — OXYCODONE HCL 5 MG PO TABS
10.0000 mg | ORAL_TABLET | ORAL | Status: DC | PRN
  Administered 2023-07-01 (×2): 10 mg via ORAL
  Filled 2023-06-30 (×3): qty 2

## 2023-06-30 MED ORDER — LIDOCAINE-EPINEPHRINE 1 %-1:100000 IJ SOLN
INTRAMUSCULAR | Status: AC
  Filled 2023-06-30: qty 1

## 2023-06-30 MED ORDER — LIDOCAINE 4 % EX CREA: TOPICAL_CREAM | Freq: Every day | CUTANEOUS | Status: DC | PRN

## 2023-06-30 MED ORDER — CHLORHEXIDINE GLUCONATE 0.12 % MT SOLN
15.0000 mL | Freq: Once | OROMUCOSAL | Status: DC
  Filled 2023-06-30: qty 15

## 2023-06-30 MED ORDER — ALBUTEROL SULFATE HFA 108 (90 BASE) MCG/ACT IN AERS: 1.0000 | INHALATION_SPRAY | Freq: Four times a day (QID) | RESPIRATORY_TRACT | Status: DC | PRN

## 2023-06-30 MED ORDER — ALBUMIN HUMAN 5 % IV SOLN
INTRAVENOUS | Status: DC | PRN
Start: 2023-06-30 — End: 2023-06-30

## 2023-06-30 MED ORDER — ALBUMIN HUMAN 5 % IV SOLN
12.5000 g | Freq: Once | INTRAVENOUS | Status: AC
  Administered 2023-06-30: 12.5 g via INTRAVENOUS

## 2023-06-30 MED ORDER — IOHEXOL 300 MG/ML  SOLN
150.0000 mL | Freq: Once | INTRAMUSCULAR | Status: AC | PRN
  Administered 2023-06-30: 80 mL via INTRA_ARTERIAL

## 2023-06-30 MED ORDER — POTASSIUM CHLORIDE CRYS ER 10 MEQ PO TBCR
20.0000 meq | EXTENDED_RELEASE_TABLET | Freq: Every day | ORAL | Status: DC
  Administered 2023-06-30 – 2023-07-02 (×3): 20 meq via ORAL
  Filled 2023-06-30 (×2): qty 2
  Filled 2023-06-30: qty 1

## 2023-06-30 MED ORDER — DILTIAZEM HCL ER COATED BEADS 180 MG PO CP24
180.0000 mg | ORAL_CAPSULE | Freq: Every day | ORAL | Status: DC
  Administered 2023-07-01 – 2023-07-02 (×2): 180 mg via ORAL
  Filled 2023-06-30 (×2): qty 1

## 2023-06-30 MED ORDER — ACETAMINOPHEN 325 MG PO TABS: 975.0000 mg | ORAL_TABLET | Freq: Four times a day (QID) | ORAL | Status: DC | PRN

## 2023-06-30 MED ORDER — PROPOFOL 10 MG/ML IV BOLUS
INTRAVENOUS | Status: DC | PRN
  Administered 2023-06-30: 150 mg via INTRAVENOUS

## 2023-06-30 MED ORDER — SODIUM CHLORIDE 0.9 % IV SOLN: INTRAVENOUS | Status: DC

## 2023-06-30 MED ORDER — OXYCODONE HCL 5 MG PO TABS
5.0000 mg | ORAL_TABLET | ORAL | Status: DC | PRN
  Administered 2023-07-01: 5 mg via ORAL

## 2023-06-30 MED ORDER — LIDOCAINE 2% (20 MG/ML) 5 ML SYRINGE
INTRAMUSCULAR | Status: DC | PRN
  Administered 2023-06-30: 100 mg via INTRAVENOUS

## 2023-06-30 MED ORDER — PHENYLEPHRINE 80 MCG/ML (10ML) SYRINGE FOR IV PUSH (FOR BLOOD PRESSURE SUPPORT)
PREFILLED_SYRINGE | INTRAVENOUS | Status: DC | PRN
  Administered 2023-06-30 (×2): 160 ug via INTRAVENOUS
  Administered 2023-06-30: 80 ug via INTRAVENOUS
  Administered 2023-06-30 (×5): 160 ug via INTRAVENOUS
  Administered 2023-06-30: 80 ug via INTRAVENOUS

## 2023-06-30 MED ORDER — SODIUM CHLORIDE 0.9 % IV SOLN
2.0000 g | INTRAVENOUS | Status: AC
  Administered 2023-06-30: 2 g via INTRAVENOUS
  Filled 2023-06-30: qty 20

## 2023-06-30 MED ORDER — MIDODRINE HCL 5 MG PO TABS
10.0000 mg | ORAL_TABLET | Freq: Three times a day (TID) | ORAL | Status: DC
  Administered 2023-06-30 – 2023-07-02 (×6): 10 mg via ORAL
  Filled 2023-06-30 (×6): qty 2

## 2023-06-30 MED ORDER — LIPIODOL ULTRAFLUID INJECTION
10.0000 mL | Freq: Once | INTRAMUSCULAR | Status: AC
  Administered 2023-06-30: 3 mL via INTRA_ARTERIAL

## 2023-06-30 MED ORDER — ROCURONIUM BROMIDE 10 MG/ML (PF) SYRINGE
PREFILLED_SYRINGE | INTRAVENOUS | Status: DC | PRN
  Administered 2023-06-30 (×2): 20 mg via INTRAVENOUS
  Administered 2023-06-30: 70 mg via INTRAVENOUS
  Administered 2023-06-30: 30 mg via INTRAVENOUS

## 2023-06-30 MED ORDER — GELATIN ABSORBABLE 12-7 MM EX MISC
1.0000 | Freq: Once | CUTANEOUS | Status: AC
  Administered 2023-06-30: 1 via TOPICAL

## 2023-06-30 MED ORDER — CHLORHEXIDINE GLUCONATE CLOTH 2 % EX PADS
6.0000 | MEDICATED_PAD | Freq: Every day | CUTANEOUS | Status: DC
  Administered 2023-06-30 – 2023-07-02 (×3): 6 via TOPICAL

## 2023-06-30 MED ORDER — LACTATED RINGERS IV SOLN: INTRAVENOUS | Status: DC

## 2023-06-30 MED ORDER — LIDOCAINE HCL-BENZYL ALCOHOL 4-10 % EX CREA: 1.0000 | TOPICAL_CREAM | Freq: Every day | CUTANEOUS | Status: DC | PRN

## 2023-06-30 MED ORDER — ALBUTEROL SULFATE (2.5 MG/3ML) 0.083% IN NEBU: 2.5000 mg | INHALATION_SOLUTION | Freq: Four times a day (QID) | RESPIRATORY_TRACT | Status: DC | PRN

## 2023-06-30 MED ORDER — ONDANSETRON HCL 4 MG/2ML IJ SOLN
INTRAMUSCULAR | Status: DC | PRN
  Administered 2023-06-30: 4 mg via INTRAVENOUS

## 2023-06-30 MED ORDER — INSULIN ASPART 100 UNIT/ML IJ SOLN: 0.0000 [IU] | INTRAMUSCULAR | Status: DC | PRN

## 2023-06-30 MED ORDER — LACTULOSE 10 GM/15ML PO SOLN
20.0000 g | Freq: Three times a day (TID) | ORAL | Status: DC
  Administered 2023-07-01 – 2023-07-02 (×4): 20 g via ORAL
  Filled 2023-06-30 (×5): qty 30

## 2023-06-30 MED ORDER — INSULIN ASPART 100 UNIT/ML IJ SOLN
0.0000 [IU] | Freq: Three times a day (TID) | INTRAMUSCULAR | Status: DC
  Administered 2023-07-01: 4 [IU] via SUBCUTANEOUS
  Administered 2023-07-01: 7 [IU] via SUBCUTANEOUS
  Administered 2023-07-01: 4 [IU] via SUBCUTANEOUS
  Administered 2023-07-02: 3 [IU] via SUBCUTANEOUS
  Administered 2023-07-02: 4 [IU] via SUBCUTANEOUS

## 2023-06-30 MED ORDER — RIFAXIMIN 550 MG PO TABS
550.0000 mg | ORAL_TABLET | Freq: Two times a day (BID) | ORAL | Status: DC
  Administered 2023-06-30 – 2023-07-02 (×4): 550 mg via ORAL
  Filled 2023-06-30 (×5): qty 1

## 2023-06-30 MED ORDER — DEXAMETHASONE SODIUM PHOSPHATE 10 MG/ML IJ SOLN
INTRAMUSCULAR | Status: DC | PRN
  Administered 2023-06-30: 5 mg via INTRAVENOUS

## 2023-06-30 MED ORDER — TORSEMIDE 20 MG PO TABS
80.0000 mg | ORAL_TABLET | Freq: Every morning | ORAL | Status: DC
  Administered 2023-07-01 – 2023-07-02 (×2): 80 mg via ORAL
  Filled 2023-06-30 (×2): qty 4

## 2023-06-30 MED ORDER — PHENYLEPHRINE HCL-NACL 20-0.9 MG/250ML-% IV SOLN
INTRAVENOUS | Status: DC | PRN
  Administered 2023-06-30: 40 ug/min via INTRAVENOUS

## 2023-06-30 MED ORDER — MIDAZOLAM HCL 2 MG/2ML IJ SOLN
INTRAMUSCULAR | Status: DC | PRN
  Administered 2023-06-30: 2 mg via INTRAVENOUS

## 2023-06-30 MED ORDER — ALBUMIN HUMAN 5 % IV SOLN
INTRAVENOUS | Status: AC
  Filled 2023-06-30: qty 250

## 2023-06-30 NOTE — Anesthesia Procedure Notes (Addendum)
Procedure Name: Intubation Date/Time: 06/30/2023 8:52 AM  Performed by: Sharyn Dross, CRNAPre-anesthesia Checklist: Patient identified, Emergency Drugs available, Suction available and Patient being monitored Patient Re-evaluated:Patient Re-evaluated prior to induction Oxygen Delivery Method: Circle system utilized Preoxygenation: Pre-oxygenation with 100% oxygen Induction Type: IV induction Ventilation: Mask ventilation without difficulty Laryngoscope Size: Mac Grade View: Grade I Tube type: Oral Tube size: 7.5 mm Number of attempts: 1 Airway Equipment and Method: Stylet and Oral airway Placement Confirmation: ETT inserted through vocal cords under direct vision, positive ETCO2 and breath sounds checked- equal and bilateral Secured at: 23 cm Tube secured with: Tape Dental Injury: Teeth and Oropharynx as per pre-operative assessment

## 2023-06-30 NOTE — Anesthesia Procedure Notes (Signed)
Arterial Line Insertion Start/End10/07/2023 8:53 AM, 06/30/2023 8:55 AM Performed by: Kaylyn Layer, MD, Sharyn Dross, CRNA, anesthesiologist  Patient location: OR. Preanesthetic checklist: patient identified, IV checked, site marked, risks and benefits discussed, surgical consent, monitors and equipment checked, pre-op evaluation, timeout performed and anesthesia consent Left, radial was placed Catheter size: 20 G Hand hygiene performed  and maximum sterile barriers used   Attempts: 1 Procedure performed without using ultrasound guided technique. Following insertion, dressing applied. Post procedure assessment: normal and unchanged  Patient tolerated the procedure well with no immediate complications.

## 2023-06-30 NOTE — Sedation Documentation (Addendum)
Pre Tips R. [facility] Pressure: 

## 2023-06-30 NOTE — Procedures (Signed)
Interventional Radiology Procedure Note  Procedure:  1) Balloon occlusion retrograde transvenous obliteration of gastric varix 2) Unsuccessful TIPS creation  Findings: Please refer to procedural dictation for full description.  Isolated gastric varix successful BRTO.  Unable to identify portal targets for transjugular access.  Unsuccessful transsplenic access to splenic vein.  Complications: None immediate  Estimated Blood Loss: < 5 mL  Recommendations: Admit overnight to IR. Resume home medications.   Marliss Coots, MD

## 2023-06-30 NOTE — Progress Notes (Signed)
Per Dr. Stephannie Peters, Pt BP runs low at baseline, no more interventions needed for hypotension at this time other than give home dose of midodrine. Patient level of care changed to progressive.

## 2023-06-30 NOTE — Sedation Documentation (Signed)
Bedside report given to PACU RN. All questions answered prior to this RN's departure. Right neck, right groin and left flank sites clean, dry and intact. No hematoma or bleeding noted at hand off.

## 2023-06-30 NOTE — Progress Notes (Signed)
Dr. Stephannie Peters made aware of today's CMP and BMP result. Potassium 3.0and platelet count 68. No new orders received.

## 2023-06-30 NOTE — Progress Notes (Addendum)
Pt takes potassium ER in a capsule form. She tried a dose of Klor-con that we carry just for tonight but she  prefers her own med. Pt does not want the packet too.  -Care ongoing. -Pharmacist will come to talk to this pt tomorrow. Per veronda regarding this meds. Informed pt about the policy.

## 2023-06-30 NOTE — Progress Notes (Signed)
67 y.o. female inpatient.  Patient presents for transjugular intrahepatic portosystem shunt creation, possible transsplenic portal vein recanalization, and variceal embolization including coil embolization of the gastrorenal shunt near the confluence with the left renal vein, and the SMV-->IVC varix, in addition to possibly providing sclerotherapy to the gastroesophageal varices. And PICC line placement for IV access with IR Attending Dr. Marliss Coots. Unfortunately the procedure was unsuccessful and the TIPS could not be created. The patient was able to see have the balloon occlusion of the gastric varix.  Patient seen at bedside with IR Attending Dr Marliss Coots in PACU. Patient alert and oriented. Slightly tachycardic b/p on cuff 107/60.  Hypotensive on pressors. Currently titrated down to 20 mcg of phenylephrine.   Plan overnight admission for further evaluation. Hopeful that should everything remain within normal limits Patient to be discharged on 10.12.24.  Patient to follow up in IR clinic in 1 month time with CT. Orders in place to facilitate.  All [name] questions and and concerns were answered.

## 2023-06-30 NOTE — Transfer of Care (Signed)
Immediate Anesthesia Transfer of Care Note  Patient: LAVRA IMLER  Procedure(s) Performed: TRANS-JUGULAR INTRAHEPATIC PORTAL SHUNT (TIPS)  Patient Location: PACU  Anesthesia Type:General  Level of Consciousness: awake, alert , and oriented  Airway & Oxygen Therapy: Patient connected to face mask oxygen  Post-op Assessment: Report given to RN and Post -op Vital signs reviewed and stable  Post vital signs: Reviewed and stable  Last Vitals:  Vitals Value Taken Time  BP 113/56 06/30/23 1315  Temp    Pulse 96 06/30/23 1316  Resp 36 06/30/23 1316  SpO2 99 % 06/30/23 1316  Vitals shown include unfiled device data.  Last Pain:  Vitals:   06/30/23 1310  TempSrc:   PainSc: Asleep      Patients Stated Pain Goal: 0 (06/30/23 9604)  Complications: There were no known notable events for this encounter.

## 2023-07-01 ENCOUNTER — Inpatient Hospital Stay (HOSPITAL_COMMUNITY): Payer: Medicare PPO

## 2023-07-01 LAB — COMPREHENSIVE METABOLIC PANEL WITH GFR
ALT: 38 U/L (ref 0–44)
AST: 100 U/L — ABNORMAL HIGH (ref 15–41)
Albumin: 2.4 g/dL — ABNORMAL LOW (ref 3.5–5.0)
Alkaline Phosphatase: 73 U/L (ref 38–126)
Anion gap: 9 (ref 5–15)
BUN: 24 mg/dL — ABNORMAL HIGH (ref 8–23)
CO2: 23 mmol/L (ref 22–32)
Calcium: 8.6 mg/dL — ABNORMAL LOW (ref 8.9–10.3)
Chloride: 105 mmol/L (ref 98–111)
Creatinine, Ser: 1.97 mg/dL — ABNORMAL HIGH (ref 0.44–1.00)
GFR, Estimated: 27 mL/min — ABNORMAL LOW
Glucose, Bld: 214 mg/dL — ABNORMAL HIGH (ref 70–99)
Potassium: 3.6 mmol/L (ref 3.5–5.1)
Sodium: 137 mmol/L (ref 135–145)
Total Bilirubin: 3 mg/dL — ABNORMAL HIGH (ref 0.3–1.2)
Total Protein: 5.4 g/dL — ABNORMAL LOW (ref 6.5–8.1)

## 2023-07-01 LAB — CBC
HCT: 23.1 % — ABNORMAL LOW (ref 36.0–46.0)
HCT: 23.3 % — ABNORMAL LOW (ref 36.0–46.0)
Hemoglobin: 7.7 g/dL — ABNORMAL LOW (ref 12.0–15.0)
Hemoglobin: 7.8 g/dL — ABNORMAL LOW (ref 12.0–15.0)
MCH: 33 pg (ref 26.0–34.0)
MCH: 33.6 pg (ref 26.0–34.0)
MCHC: 33.3 g/dL (ref 30.0–36.0)
MCHC: 33.5 g/dL (ref 30.0–36.0)
MCV: 99.1 fL (ref 80.0–100.0)
MCV: 100.4 fL — ABNORMAL HIGH (ref 80.0–100.0)
Platelets: 64 K/uL — ABNORMAL LOW (ref 150–400)
Platelets: 67 10*3/uL — ABNORMAL LOW (ref 150–400)
RBC: 2.33 MIL/uL — ABNORMAL LOW (ref 3.87–5.11)
RBC: 2.32 MIL/uL — ABNORMAL LOW (ref 3.87–5.11)
RDW: 16 % — ABNORMAL HIGH (ref 11.5–15.5)
RDW: 16.1 % — ABNORMAL HIGH (ref 11.5–15.5)
WBC: 8.6 K/uL (ref 4.0–10.5)
WBC: 10.3 10*3/uL (ref 4.0–10.5)
nRBC: 0.3 % — ABNORMAL HIGH (ref 0.0–0.2)
nRBC: 0.3 % — ABNORMAL HIGH (ref 0.0–0.2)

## 2023-07-01 LAB — POCT I-STAT 7, (LYTES, BLD GAS, ICA,H+H)
Acid-base deficit: 2 mmol/L (ref 0.0–2.0)
Bicarbonate: 23.4 mmol/L (ref 20.0–28.0)
Calcium, Ion: 1.12 mmol/L — ABNORMAL LOW (ref 1.15–1.40)
HCT: 28 % — ABNORMAL LOW (ref 36.0–46.0)
Hemoglobin: 9.5 g/dL — ABNORMAL LOW (ref 12.0–15.0)
O2 Saturation: 100 %
Patient temperature: 36.4
Potassium: 3.1 mmol/L — ABNORMAL LOW (ref 3.5–5.1)
Sodium: 140 mmol/L (ref 135–145)
TCO2: 25 mmol/L (ref 22–32)
pCO2 arterial: 40.6 mm[Hg] (ref 32–48)
pH, Arterial: 7.366 (ref 7.35–7.45)
pO2, Arterial: 192 mm[Hg] — ABNORMAL HIGH (ref 83–108)

## 2023-07-01 LAB — COMPREHENSIVE METABOLIC PANEL
ALT: 62 U/L — ABNORMAL HIGH (ref 0–44)
AST: 183 U/L — ABNORMAL HIGH (ref 15–41)
Albumin: 2.7 g/dL — ABNORMAL LOW (ref 3.5–5.0)
Alkaline Phosphatase: 105 U/L (ref 38–126)
Anion gap: 11 (ref 5–15)
BUN: 29 mg/dL — ABNORMAL HIGH (ref 8–23)
CO2: 24 mmol/L (ref 22–32)
Calcium: 8.9 mg/dL (ref 8.9–10.3)
Chloride: 103 mmol/L (ref 98–111)
Creatinine, Ser: 2.25 mg/dL — ABNORMAL HIGH (ref 0.44–1.00)
GFR, Estimated: 23 mL/min — ABNORMAL LOW (ref >60)
Glucose, Bld: 186 mg/dL — ABNORMAL HIGH (ref 70–99)
Potassium: 3.5 mmol/L (ref 3.5–5.1)
Sodium: 138 mmol/L (ref 135–145)
Total Bilirubin: 7.7 mg/dL — ABNORMAL HIGH (ref 0.3–1.2)
Total Protein: 5.8 g/dL — ABNORMAL LOW (ref 6.5–8.1)

## 2023-07-01 MED ORDER — SODIUM CHLORIDE 0.9 % IV SOLN
8.0000 mg | Freq: Four times a day (QID) | INTRAVENOUS | Status: DC | PRN
  Administered 2023-07-01: 8 mg via INTRAVENOUS
  Filled 2023-07-01: qty 4

## 2023-07-01 MED ORDER — HYDROMORPHONE HCL 1 MG/ML IJ SOLN
1.0000 mg | INTRAMUSCULAR | Status: DC | PRN
  Administered 2023-07-01 (×2): 1 mg via INTRAVENOUS
  Filled 2023-07-01 (×3): qty 1

## 2023-07-01 NOTE — Progress Notes (Signed)
Referring Physician(s): Dr. Willis Modena  Supervising Physician: Marliss Coots  Patient Status:  [facility] - In-pt  Chief Complaint: NASH Cirrhosis, portal hypertension, gastroesophageal varices, multiple systemic portosystemic shunts and portal portal vein thrombus s/p unsuccessful TIPS with coil-assisted retrograde transvenous obliteration of gastrorenal varices 06/30/23 by Dr. Elby Showers.   Subjective:  Patient developed atrial fibrillation with RVR this morning. She is asymptomatic. She has complaints of epigastric/RUQ abdominal pain that she rates a 5/10.   Allergies: Celebrex [celecoxib], Shellfish allergy, Barium-containing compounds, Prednisone, Statins, Fentanyl, Ibuprofen, Sglt2 inhibitors, Tramadol, Tylenol [acetaminophen], and Zetia [ezetimibe]  Medications: Prior to Admission medications   Medication Sig Start Date End Date Taking? Authorizing Provider  digoxin (LANOXIN) 0.125 MG tablet Take 1 tablet (0.125 mg total) by mouth daily. 04/28/23  Yes Sabharwal, Aditya, DO  diltiazem (CARDIZEM CD) 180 MG 24 hr capsule Take 1 capsule (180 mg total) by mouth daily. 04/18/23  Yes Alver Sorrow, NP  doxycycline (ADOXA) 100 MG tablet Take 100 mg by mouth 2 (two) times daily. Per patient last dose will be Friday 06-30-23   Yes [provider]  lactulose (CHRONULAC) 10 GM/15ML solution Take 20 g by mouth 3 (three) times daily. 03/17/21  Yes [provider]  Lidocaine HCl-Benzyl Alcohol (SALONPAS LIDOCAINE PLUS) 4-10 % CREA Apply 1 Application topically daily as needed (knee pain).   Yes [provider]  metolazone (ZAROXOLYN) 5 MG tablet Take 1 tablet (5 mg total) by mouth once a week. Take one tablet on Sundays 30 minutes before Bumex 02/28/23  Yes Alver Sorrow, NP  metoprolol tartrate (LOPRESSOR) 25 MG tablet Take 0.5 tablets (12.5 mg total) by mouth 2 (two) times daily. 05/08/23  Yes Milford, Anderson Malta, FNP  midodrine (PROAMATINE) 10 MG tablet Take 1 tablet (10  mg total) by mouth 3 (three) times daily. 04/18/23  Yes Alver Sorrow, NP  Multiple Vitamins-Minerals (PRESERVISION AREDS 2) CAPS Take 1 capsule by mouth 2 (two) times daily.   Yes [provider]  [facility] VERIO test strip 1 each 3 (three) times daily. 04/13/20  Yes [provider]  potassium chloride (MICRO-K) 10 MEQ CR capsule Take 10 capsules (100 mEq total) by mouth 2 (two) times daily. Take an extra 4 tablets when taking Metolazone. 06/15/23  Yes Andrey Farmer, PA-C  Propylene Glycol (SYSTANE BALANCE) 0.6 % SOLN Place 1 drop into both eyes 2 (two) times daily.   Yes [provider]  torsemide (DEMADEX) 20 MG tablet Take 4 tablets (80 mg total) by mouth every morning AND 3 tablets (60 mg total) every evening. 06/01/23  Yes Alen Bleacher, NP  XIFAXAN 550 MG TABS tablet Take 550 mg by mouth 2 (two) times daily. 03/13/21  Yes [provider]  albuterol (VENTOLIN HFA) 108 (90 Base) MCG/ACT inhaler Inhale 1-2 puffs into the lungs every 6 (six) hours as needed for wheezing or shortness of breath.    [provider]  Lancets [facility] DELICA PLUS [place]) MISC Apply topically 3 (three) times daily. 04/10/20   [provider]     Vital Signs: BP 126/70 (BP Location: Left Arm)   Pulse 99   Temp 98.3 F (36.8 C) (Oral)   Resp 20   Ht 5\' 7"  (1.702 m)   Wt (!) 331 lb 2.1 oz (150.2 kg)   SpO2 98%   BMI 51.86 kg/m   Physical Exam Constitutional:      General: She is not in acute distress.    Appearance:  She is obese. She is not ill-appearing.  Cardiovascular:     Rate and Rhythm: Tachycardia present. Rhythm irregular.     Heart sounds: Murmur heard.     Comments: Right femoral vascular access site is clean, soft and dry. Site is without erythema, drainage or tenderness. New dressing applied. Right neck vascular site is tender but is otherwise unremarkable. Right upper arm PICC site is tender. Left flank/abdomen procedure site is  clean, soft, dry and non-tender.  Pulmonary:     Comments: Decreased breath sounds bilaterally.  Abdominal:     General: Bowel sounds are normal.     Tenderness: There is abdominal tenderness.  Musculoskeletal:     Right lower leg: Edema present.     Left lower leg: Edema present.  Skin:    General: Skin is warm and dry.  Neurological:     Mental Status: She is alert and oriented to person, place, and time.  Psychiatric:        Mood and Affect: Mood normal.        Behavior: Behavior normal.        Thought Content: Thought content normal.        Judgment: Judgment normal.     Imaging: IR Tips  Result Date: 06/30/2023 CLINICAL DATA:  67 year old female with history of NASH cirrhosis (Child Pugh B, MELD 20), portal hypertension, and gastroesophageal varices without history of hemorrhage. 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 She has very large gastroesophageal varices. 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 EXAM: 1. Ultrasound guided vascular access of the right basilic vein 2. Peripherally inserted central catheter placement 3. Ultrasound-guided access of the right internal jugular vein 4. Ultrasound-guided access of the right greater saphenous vein 5. Hepatic venogram 6. Intravascular ultrasound 7. Central venous manometery 8. Coil assisted retrograde transvenous obliteration of gastrorenal varices MEDICATIONS: As antibiotic prophylaxis, Rocephin 2 gm IV was ordered pre-procedure and administered intravenously within one hour of incision. ANESTHESIA/SEDATION: General - as administered by the Anesthesia department CONTRAST:  One hundred sixty ML Omnipaque 300, intravenous FLUOROSCOPY TIME:  Fluoroscopy Time: 40 minutes (2,117 mGy). COMPLICATIONS: None immediate. PROCEDURE: Informed  written consent was obtained from the patient after a thorough discussion of the procedural risks, benefits and alternatives. All questions were addressed. Maximal Sterile Barrier Technique was utilized including caps, mask, sterile gowns, sterile gloves, sterile drape, hand hygiene and skin antiseptic. A timeout was performed prior to the initiation of the procedure. Preprocedure ultrasound evaluation of the right upper extremity demonstrated a patent basilic vein. The procedure was planned. Subdermal Local anesthesia was provided at the planned needle entry site with 1% lidocaine. A small skin nick was made. Under direct ultrasound visualization, the right basilic vein was punctured with a 21 gauge micropuncture needle. A permanent ultrasound image was captured and stored in the record. A guidewire was inserted over which a 5 French peel-away sheath was placed. The wire and inner portion of the sheath were removed and a 38 cm, dual lumen 5 French peripherally inserted central catheter were then placed. Catheter tip terminates near the cavoatrial junction. Both lumens flushed and aspirated with ease. A sterile bandage was applied. This venous access was used by the department of anesthesia throughout the procedure. A preliminary ultrasound of the right groin was performed and demonstrates a patent right common femoral vein. A permanent  ultrasound image was recorded. Using a combination of fluoroscopy and ultrasound, an access site was determined. A small dermatotomy was made at the planned puncture site. Using ultrasound guidance, access into the right common femoral vein was obtained with visualization of needle entry into the vessel using a standard micropuncture technique. A wire was advanced into the IVC insert all fascial dilation performed. An 8 [place], 11 cm vascular sheath was placed into the external iliac vein. Through this access site, an 66 [place] ICE catheter was advanced with ease under  fluoroscopic guidance to the level of the intrahepatic inferior vena cava. A preliminary ultrasound of the right neck was performed and demonstrates a patent internal jugular vein. A permanent ultrasound image was recorded. Using a combination of fluoroscopy and ultrasound, an access site was determined. A small dermatotomy was made at the planned puncture site. Using ultrasound guidance, access into the right internal jugular vein was obtained with visualization of needle entry into the vessel using a standard micropuncture technique. A wire was advanced into the IVC and serial fascial dilation performed. A 10 French tips sheath was placed into the internal jugular vein and advanced to the IVC. The jugular sheath was retracted into the right atrium and manometry was performed and measured to be a mean of 14 mm Hg. A 5 French angled tip catheter was then directed into the left renal vein. Left renal venogram was performed which demonstrated patency and patent inflow from a large gastro renal shunt. The gastro renal shunt was then selected. A balloon occlusion catheter was inserted and inflated to occlude the outflow. Dedicated variceal venogram was then performed which demonstrated an isolated gastric varix with no significant evidence of venous outflow to the nearby esophageal varices or portal venous system. Transvenous obliteration was then performed with approximately 6 mL of a solution of Gel-Foam slurry, air, 3% sotradecol, and lipiodol. The occlusion balloon catheter was then flushed and coil embolization was performed at the inferior aspect of the gastro renal shunt with an assortment of 0.035" detachable Azur coils. The Fogarty catheter was then deflated and removed. Repeat left renal venogram demonstrated no evidence of reflux into the occluded gastro renal shunt. A 5 French angled tip catheter was then directed into the middle hepatic vein. Hepatic venogram was performed. These images demonstrated patent  hepatic vein with no stenosis. The catheter was advanced to a wedge portion of the a patent vein over which the 10 French sheath was advanced into the middle hepatic vein. Using ICE ultrasound visualization the catheter as middle hepatic vein as well as the portal anatomy was defined. A planned exit site from the hepatic vein and puncture site from the suspected portal vein was placed into a single sonographic plane. Under direct ultrasound visualization, the ScorpionX needle was advanced into the suspected occluded portal vein. This was unsuccessful in cannulation. The needle set was removed. The left flank was then prepped and draped in standard fashion. Ultrasound demonstrated an enlarged spleen with a patent straight appearing vein near the splenic hilum which appeared appropriate for puncture. The under direct ultrasound visualization, the splenic vein was attempted to be cannulated with a 21 gauge Chiba needle making a proximally 6 separate passes, ultimately failing to gain access to the splenic vein. Completion ultrasound demonstrated no evidence of surrounding hematoma or other complicating features. Given failure of attempted trans splenic portal venous recanalization, additional attempt at transjugular transhepatic portosystemic shunt creation was attempted with ICE in fluoroscopic guidance however from the right  portal vein. A single pass was attempted at what appears to be in a track portal target in the right lobe of the liver, however there is inability to advance a wire into the structure. The needle set was removed once again. Next, attempted cannulating the large SMV to IVC ectopic varix in the right lower quadrant was attempted. Balloon occlusion venogram was performed which demonstrated primarily right gonadal vein opacification. There is no evidence of portal opacification for possible image guided targeting. At this point, the procedure was terminated. The catheters and sheath were removed and  manual compression was applied to the right internal jugular and right greater saphenous venous access sites until hemostasis was achieved. The patient was transferred to the PACU in stable condition. IMPRESSION: 1. Technically successful coil assisted retrograde transvenous obliteration of isolated large gastrorenal variceal shunt. 2. Technically unsuccessful transjugular intrahepatic portosystemic shunt creation and attempted transsplenic portal vein recanalization. 3. Technically successful right upper extremity image guided peripherally inserted central catheter placement. Marliss Coots, MD [facility] Radiology Electronically Signed   By: Marliss Coots M.D.   On: 06/30/2023 21:46   IR PICC PLACEMENT LEFT <5 YRS INC IMG GUIDE  Result Date: 06/30/2023 CLINICAL DATA:  67 year old female with history of NASH cirrhosis (Child Pugh B, MELD 20), portal hypertension, and gastroesophageal varices without history of hemorrhage. 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 She has very large gastroesophageal varices. 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 EXAM: 1. Ultrasound guided vascular access of the right basilic vein 2. Peripherally inserted central catheter placement 3. Ultrasound-guided access of the right internal jugular vein 4. Ultrasound-guided access of the right greater saphenous vein 5. Hepatic venogram 6. Intravascular ultrasound 7. Central venous manometery 8. Coil assisted retrograde transvenous obliteration of gastrorenal varices MEDICATIONS: As antibiotic prophylaxis, Rocephin 2 gm IV was ordered pre-procedure and administered intravenously within one hour of incision. ANESTHESIA/SEDATION: General - as administered by the Anesthesia department  CONTRAST:  One hundred sixty ML Omnipaque 300, intravenous FLUOROSCOPY TIME:  Fluoroscopy Time: 40 minutes (2,117 mGy). COMPLICATIONS: None immediate. PROCEDURE: Informed written consent was obtained from the patient after a thorough discussion of the procedural risks, benefits and alternatives. All questions were addressed. Maximal Sterile Barrier Technique was utilized including caps, mask, sterile gowns, sterile gloves, sterile drape, hand hygiene and skin antiseptic. A timeout was performed prior to the initiation of the procedure. Preprocedure ultrasound evaluation of the right upper extremity demonstrated a patent basilic vein. The procedure was planned. Subdermal Local anesthesia was provided at the planned needle entry site with 1% lidocaine. A small skin nick was made. Under direct ultrasound visualization, the right basilic vein was punctured with a 21 gauge micropuncture needle. A permanent ultrasound image was captured and stored in the record. A guidewire was inserted over which a 5 French peel-away sheath was placed. The wire and inner portion of the sheath were removed and a 38 cm, dual lumen 5 French peripherally inserted central catheter were then placed. Catheter tip terminates near the cavoatrial junction. Both lumens flushed and aspirated with ease. A sterile bandage was applied. This venous access was used by the department of anesthesia throughout the procedure. A preliminary ultrasound of the right groin was performed and demonstrates a patent right common femoral vein. A permanent ultrasound image was recorded. Using a combination of fluoroscopy  and ultrasound, an access site was determined. A small dermatotomy was made at the planned puncture site. Using ultrasound guidance, access into the right common femoral vein was obtained with visualization of needle entry into the vessel using a standard micropuncture technique. A wire was advanced into the IVC insert all fascial dilation performed.  An 8 [place], 11 cm vascular sheath was placed into the external iliac vein. Through this access site, an 71 [place] ICE catheter was advanced with ease under fluoroscopic guidance to the level of the intrahepatic inferior vena cava. A preliminary ultrasound of the right neck was performed and demonstrates a patent internal jugular vein. A permanent ultrasound image was recorded. Using a combination of fluoroscopy and ultrasound, an access site was determined. A small dermatotomy was made at the planned puncture site. Using ultrasound guidance, access into the right internal jugular vein was obtained with visualization of needle entry into the vessel using a standard micropuncture technique. A wire was advanced into the IVC and serial fascial dilation performed. A 10 French tips sheath was placed into the internal jugular vein and advanced to the IVC. The jugular sheath was retracted into the right atrium and manometry was performed and measured to be a mean of 14 mm Hg. A 5 French angled tip catheter was then directed into the left renal vein. Left renal venogram was performed which demonstrated patency and patent inflow from a large gastro renal shunt. The gastro renal shunt was then selected. A balloon occlusion catheter was inserted and inflated to occlude the outflow. Dedicated variceal venogram was then performed which demonstrated an isolated gastric varix with no significant evidence of venous outflow to the nearby esophageal varices or portal venous system. Transvenous obliteration was then performed with approximately 6 mL of a solution of Gel-Foam slurry, air, 3% sotradecol, and lipiodol. The occlusion balloon catheter was then flushed and coil embolization was performed at the inferior aspect of the gastro renal shunt with an assortment of 0.035" detachable Azur coils. The Fogarty catheter was then deflated and removed. Repeat left renal venogram demonstrated no evidence of reflux into the occluded  gastro renal shunt. A 5 French angled tip catheter was then directed into the middle hepatic vein. Hepatic venogram was performed. These images demonstrated patent hepatic vein with no stenosis. The catheter was advanced to a wedge portion of the a patent vein over which the 10 French sheath was advanced into the middle hepatic vein. Using ICE ultrasound visualization the catheter as middle hepatic vein as well as the portal anatomy was defined. A planned exit site from the hepatic vein and puncture site from the suspected portal vein was placed into a single sonographic plane. Under direct ultrasound visualization, the ScorpionX needle was advanced into the suspected occluded portal vein. This was unsuccessful in cannulation. The needle set was removed. The left flank was then prepped and draped in standard fashion. Ultrasound demonstrated an enlarged spleen with a patent straight appearing vein near the splenic hilum which appeared appropriate for puncture. The under direct ultrasound visualization, the splenic vein was attempted to be cannulated with a 21 gauge Chiba needle making a proximally 6 separate passes, ultimately failing to gain access to the splenic vein. Completion ultrasound demonstrated no evidence of surrounding hematoma or other complicating features. Given failure of attempted trans splenic portal venous recanalization, additional attempt at transjugular transhepatic portosystemic shunt creation was attempted with ICE in fluoroscopic guidance however from the right portal vein. A single pass was attempted at what  appears to be in a track portal target in the right lobe of the liver, however there is inability to advance a wire into the structure. The needle set was removed once again. Next, attempted cannulating the large SMV to IVC ectopic varix in the right lower quadrant was attempted. Balloon occlusion venogram was performed which demonstrated primarily right gonadal vein opacification. There  is no evidence of portal opacification for possible image guided targeting. At this point, the procedure was terminated. The catheters and sheath were removed and manual compression was applied to the right internal jugular and right greater saphenous venous access sites until hemostasis was achieved. The patient was transferred to the PACU in stable condition. IMPRESSION: 1. Technically successful coil assisted retrograde transvenous obliteration of isolated large gastrorenal variceal shunt. 2. Technically unsuccessful transjugular intrahepatic portosystemic shunt creation and attempted transsplenic portal vein recanalization. 3. Technically successful right upper extremity image guided peripherally inserted central catheter placement. Marliss Coots, MD [facility] Radiology Electronically Signed   By: Marliss Coots M.D.   On: 06/30/2023 21:46   IR US Guide Vasc Access Right  Result Date: 06/30/2023 CLINICAL DATA:  67 year old female with history of NASH cirrhosis (Child Pugh B, MELD 20), portal hypertension, and gastroesophageal varices without history of hemorrhage. 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 She has very large gastroesophageal varices. 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 EXAM: 1. Ultrasound guided vascular access of the right basilic vein 2. Peripherally inserted central catheter placement 3. Ultrasound-guided access of the right internal jugular vein 4. Ultrasound-guided access of the right greater saphenous vein 5. Hepatic venogram 6. Intravascular ultrasound 7. Central venous manometery 8. Coil assisted retrograde transvenous obliteration of gastrorenal varices MEDICATIONS: As antibiotic prophylaxis, Rocephin 2 gm IV was ordered  pre-procedure and administered intravenously within one hour of incision. ANESTHESIA/SEDATION: General - as administered by the Anesthesia department CONTRAST:  One hundred sixty ML Omnipaque 300, intravenous FLUOROSCOPY TIME:  Fluoroscopy Time: 40 minutes (2,117 mGy). COMPLICATIONS: None immediate. PROCEDURE: Informed written consent was obtained from the patient after a thorough discussion of the procedural risks, benefits and alternatives. All questions were addressed. Maximal Sterile Barrier Technique was utilized including caps, mask, sterile gowns, sterile gloves, sterile drape, hand hygiene and skin antiseptic. A timeout was performed prior to the initiation of the procedure. Preprocedure ultrasound evaluation of the right upper extremity demonstrated a patent basilic vein. The procedure was planned. Subdermal Local anesthesia was provided at the planned needle entry site with 1% lidocaine. A small skin nick was made. Under direct ultrasound visualization, the right basilic vein was punctured with a 21 gauge micropuncture needle. A permanent ultrasound image was captured and stored in the record. A guidewire was inserted over which a 5 French peel-away sheath was placed. The wire and inner portion of the sheath were removed and a 38 cm, dual lumen 5 French peripherally inserted central catheter were then placed. Catheter tip terminates near the cavoatrial junction. Both lumens flushed and aspirated with ease. A sterile bandage was applied. This venous access was used by the department of anesthesia throughout the procedure. A preliminary ultrasound of the right groin was performed and demonstrates a patent right common femoral vein. A permanent ultrasound image was recorded. Using a combination of fluoroscopy and ultrasound, an access site was determined. A small dermatotomy was made  at the planned puncture site. Using ultrasound guidance, access into the right common femoral vein was obtained with  visualization of needle entry into the vessel using a standard micropuncture technique. A wire was advanced into the IVC insert all fascial dilation performed. An 8 [place], 11 cm vascular sheath was placed into the external iliac vein. Through this access site, an 44 [place] ICE catheter was advanced with ease under fluoroscopic guidance to the level of the intrahepatic inferior vena cava. A preliminary ultrasound of the right neck was performed and demonstrates a patent internal jugular vein. A permanent ultrasound image was recorded. Using a combination of fluoroscopy and ultrasound, an access site was determined. A small dermatotomy was made at the planned puncture site. Using ultrasound guidance, access into the right internal jugular vein was obtained with visualization of needle entry into the vessel using a standard micropuncture technique. A wire was advanced into the IVC and serial fascial dilation performed. A 10 French tips sheath was placed into the internal jugular vein and advanced to the IVC. The jugular sheath was retracted into the right atrium and manometry was performed and measured to be a mean of 14 mm Hg. A 5 French angled tip catheter was then directed into the left renal vein. Left renal venogram was performed which demonstrated patency and patent inflow from a large gastro renal shunt. The gastro renal shunt was then selected. A balloon occlusion catheter was inserted and inflated to occlude the outflow. Dedicated variceal venogram was then performed which demonstrated an isolated gastric varix with no significant evidence of venous outflow to the nearby esophageal varices or portal venous system. Transvenous obliteration was then performed with approximately 6 mL of a solution of Gel-Foam slurry, air, 3% sotradecol, and lipiodol. The occlusion balloon catheter was then flushed and coil embolization was performed at the inferior aspect of the gastro renal shunt with an assortment of  0.035" detachable Azur coils. The Fogarty catheter was then deflated and removed. Repeat left renal venogram demonstrated no evidence of reflux into the occluded gastro renal shunt. A 5 French angled tip catheter was then directed into the middle hepatic vein. Hepatic venogram was performed. These images demonstrated patent hepatic vein with no stenosis. The catheter was advanced to a wedge portion of the a patent vein over which the 10 French sheath was advanced into the middle hepatic vein. Using ICE ultrasound visualization the catheter as middle hepatic vein as well as the portal anatomy was defined. A planned exit site from the hepatic vein and puncture site from the suspected portal vein was placed into a single sonographic plane. Under direct ultrasound visualization, the ScorpionX needle was advanced into the suspected occluded portal vein. This was unsuccessful in cannulation. The needle set was removed. The left flank was then prepped and draped in standard fashion. Ultrasound demonstrated an enlarged spleen with a patent straight appearing vein near the splenic hilum which appeared appropriate for puncture. The under direct ultrasound visualization, the splenic vein was attempted to be cannulated with a 21 gauge Chiba needle making a proximally 6 separate passes, ultimately failing to gain access to the splenic vein. Completion ultrasound demonstrated no evidence of surrounding hematoma or other complicating features. Given failure of attempted trans splenic portal venous recanalization, additional attempt at transjugular transhepatic portosystemic shunt creation was attempted with ICE in fluoroscopic guidance however from the right portal vein. A single pass was attempted at what appears to be in a track portal target in the right lobe  of the liver, however there is inability to advance a wire into the structure. The needle set was removed once again. Next, attempted cannulating the large SMV to IVC  ectopic varix in the right lower quadrant was attempted. Balloon occlusion venogram was performed which demonstrated primarily right gonadal vein opacification. There is no evidence of portal opacification for possible image guided targeting. At this point, the procedure was terminated. The catheters and sheath were removed and manual compression was applied to the right internal jugular and right greater saphenous venous access sites until hemostasis was achieved. The patient was transferred to the PACU in stable condition. IMPRESSION: 1. Technically successful coil assisted retrograde transvenous obliteration of isolated large gastrorenal variceal shunt. 2. Technically unsuccessful transjugular intrahepatic portosystemic shunt creation and attempted transsplenic portal vein recanalization. 3. Technically successful right upper extremity image guided peripherally inserted central catheter placement. Marliss Coots, MD [facility] Radiology Electronically Signed   By: Marliss Coots M.D.   On: 06/30/2023 21:46   IR US Guide Vasc Access Right  Result Date: 06/30/2023 CLINICAL DATA:  67 year old female with history of NASH cirrhosis (Child Pugh B, MELD 20), portal hypertension, and gastroesophageal varices without history of hemorrhage. 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 She has very large gastroesophageal varices. 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 EXAM: 1. Ultrasound guided vascular access of the right basilic vein 2. Peripherally inserted central catheter placement 3. Ultrasound-guided access of the right internal jugular vein 4. Ultrasound-guided access of the right greater saphenous vein 5. Hepatic venogram 6. Intravascular ultrasound 7. Central  venous manometery 8. Coil assisted retrograde transvenous obliteration of gastrorenal varices MEDICATIONS: As antibiotic prophylaxis, Rocephin 2 gm IV was ordered pre-procedure and administered intravenously within one hour of incision. ANESTHESIA/SEDATION: General - as administered by the Anesthesia department CONTRAST:  One hundred sixty ML Omnipaque 300, intravenous FLUOROSCOPY TIME:  Fluoroscopy Time: 40 minutes (2,117 mGy). COMPLICATIONS: None immediate. PROCEDURE: Informed written consent was obtained from the patient after a thorough discussion of the procedural risks, benefits and alternatives. All questions were addressed. Maximal Sterile Barrier Technique was utilized including caps, mask, sterile gowns, sterile gloves, sterile drape, hand hygiene and skin antiseptic. A timeout was performed prior to the initiation of the procedure. Preprocedure ultrasound evaluation of the right upper extremity demonstrated a patent basilic vein. The procedure was planned. Subdermal Local anesthesia was provided at the planned needle entry site with 1% lidocaine. A small skin nick was made. Under direct ultrasound visualization, the right basilic vein was punctured with a 21 gauge micropuncture needle. A permanent ultrasound image was captured and stored in the record. A guidewire was inserted over which a 5 French peel-away sheath was placed. The wire and inner portion of the sheath were removed and a 38 cm, dual lumen 5 French peripherally inserted central catheter were then placed. Catheter tip terminates near the cavoatrial junction. Both lumens flushed and aspirated with ease. A sterile bandage was applied. This venous access was used by the department of anesthesia throughout the procedure. A preliminary ultrasound of the right groin was performed and demonstrates a patent right common femoral vein. A permanent ultrasound image was recorded. Using a combination of fluoroscopy and ultrasound, an access site was  determined. A small dermatotomy was made at the planned puncture site. Using ultrasound guidance, access into the  right common femoral vein was obtained with visualization of needle entry into the vessel using a standard micropuncture technique. A wire was advanced into the IVC insert all fascial dilation performed. An 8 [place], 11 cm vascular sheath was placed into the external iliac vein. Through this access site, an 14 [place] ICE catheter was advanced with ease under fluoroscopic guidance to the level of the intrahepatic inferior vena cava. A preliminary ultrasound of the right neck was performed and demonstrates a patent internal jugular vein. A permanent ultrasound image was recorded. Using a combination of fluoroscopy and ultrasound, an access site was determined. A small dermatotomy was made at the planned puncture site. Using ultrasound guidance, access into the right internal jugular vein was obtained with visualization of needle entry into the vessel using a standard micropuncture technique. A wire was advanced into the IVC and serial fascial dilation performed. A 10 French tips sheath was placed into the internal jugular vein and advanced to the IVC. The jugular sheath was retracted into the right atrium and manometry was performed and measured to be a mean of 14 mm Hg. A 5 French angled tip catheter was then directed into the left renal vein. Left renal venogram was performed which demonstrated patency and patent inflow from a large gastro renal shunt. The gastro renal shunt was then selected. A balloon occlusion catheter was inserted and inflated to occlude the outflow. Dedicated variceal venogram was then performed which demonstrated an isolated gastric varix with no significant evidence of venous outflow to the nearby esophageal varices or portal venous system. Transvenous obliteration was then performed with approximately 6 mL of a solution of Gel-Foam slurry, air, 3% sotradecol, and lipiodol.  The occlusion balloon catheter was then flushed and coil embolization was performed at the inferior aspect of the gastro renal shunt with an assortment of 0.035" detachable Azur coils. The Fogarty catheter was then deflated and removed. Repeat left renal venogram demonstrated no evidence of reflux into the occluded gastro renal shunt. A 5 French angled tip catheter was then directed into the middle hepatic vein. Hepatic venogram was performed. These images demonstrated patent hepatic vein with no stenosis. The catheter was advanced to a wedge portion of the a patent vein over which the 10 French sheath was advanced into the middle hepatic vein. Using ICE ultrasound visualization the catheter as middle hepatic vein as well as the portal anatomy was defined. A planned exit site from the hepatic vein and puncture site from the suspected portal vein was placed into a single sonographic plane. Under direct ultrasound visualization, the ScorpionX needle was advanced into the suspected occluded portal vein. This was unsuccessful in cannulation. The needle set was removed. The left flank was then prepped and draped in standard fashion. Ultrasound demonstrated an enlarged spleen with a patent straight appearing vein near the splenic hilum which appeared appropriate for puncture. The under direct ultrasound visualization, the splenic vein was attempted to be cannulated with a 21 gauge Chiba needle making a proximally 6 separate passes, ultimately failing to gain access to the splenic vein. Completion ultrasound demonstrated no evidence of surrounding hematoma or other complicating features. Given failure of attempted trans splenic portal venous recanalization, additional attempt at transjugular transhepatic portosystemic shunt creation was attempted with ICE in fluoroscopic guidance however from the right portal vein. A single pass was attempted at what appears to be in a track portal target in the right lobe of the liver,  however there is inability to advance a wire  into the structure. The needle set was removed once again. Next, attempted cannulating the large SMV to IVC ectopic varix in the right lower quadrant was attempted. Balloon occlusion venogram was performed which demonstrated primarily right gonadal vein opacification. There is no evidence of portal opacification for possible image guided targeting. At this point, the procedure was terminated. The catheters and sheath were removed and manual compression was applied to the right internal jugular and right greater saphenous venous access sites until hemostasis was achieved. The patient was transferred to the PACU in stable condition. IMPRESSION: 1. Technically successful coil assisted retrograde transvenous obliteration of isolated large gastrorenal variceal shunt. 2. Technically unsuccessful transjugular intrahepatic portosystemic shunt creation and attempted transsplenic portal vein recanalization. 3. Technically successful right upper extremity image guided peripherally inserted central catheter placement. Marliss Coots, MD [facility] Radiology Electronically Signed   By: Marliss Coots M.D.   On: 06/30/2023 21:46   IR US Guide Vasc Access Left  Result Date: 06/30/2023 CLINICAL DATA:  67 year old female with history of NASH cirrhosis (Child Pugh B, MELD 20), portal hypertension, and gastroesophageal varices without history of hemorrhage. 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 She has very large gastroesophageal varices. 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 EXAM: 1. Ultrasound guided vascular access of the right basilic vein 2. Peripherally inserted central catheter placement 3. Ultrasound-guided access of the  right internal jugular vein 4. Ultrasound-guided access of the right greater saphenous vein 5. Hepatic venogram 6. Intravascular ultrasound 7. Central venous manometery 8. Coil assisted retrograde transvenous obliteration of gastrorenal varices MEDICATIONS: As antibiotic prophylaxis, Rocephin 2 gm IV was ordered pre-procedure and administered intravenously within one hour of incision. ANESTHESIA/SEDATION: General - as administered by the Anesthesia department CONTRAST:  One hundred sixty ML Omnipaque 300, intravenous FLUOROSCOPY TIME:  Fluoroscopy Time: 40 minutes (2,117 mGy). COMPLICATIONS: None immediate. PROCEDURE: Informed written consent was obtained from the patient after a thorough discussion of the procedural risks, benefits and alternatives. All questions were addressed. Maximal Sterile Barrier Technique was utilized including caps, mask, sterile gowns, sterile gloves, sterile drape, hand hygiene and skin antiseptic. A timeout was performed prior to the initiation of the procedure. Preprocedure ultrasound evaluation of the right upper extremity demonstrated a patent basilic vein. The procedure was planned. Subdermal Local anesthesia was provided at the planned needle entry site with 1% lidocaine. A small skin nick was made. Under direct ultrasound visualization, the right basilic vein was punctured with a 21 gauge micropuncture needle. A permanent ultrasound image was captured and stored in the record. A guidewire was inserted over which a 5 French peel-away sheath was placed. The wire and inner portion of the sheath were removed and a 38 cm, dual lumen 5 French peripherally inserted central catheter were then placed. Catheter tip terminates near the cavoatrial junction. Both lumens flushed and aspirated with ease. A sterile bandage was applied. This venous access was used by the department of anesthesia throughout the procedure. A preliminary ultrasound of the right groin was performed and demonstrates a  patent right common femoral vein. A permanent ultrasound image was recorded. Using a combination of fluoroscopy and ultrasound, an access site was determined. A small dermatotomy was made at the planned puncture site. Using ultrasound guidance, access into the right common femoral vein was obtained with visualization of needle entry into  the vessel using a standard micropuncture technique. A wire was advanced into the IVC insert all fascial dilation performed. An 8 [place], 11 cm vascular sheath was placed into the external iliac vein. Through this access site, an 106 [place] ICE catheter was advanced with ease under fluoroscopic guidance to the level of the intrahepatic inferior vena cava. A preliminary ultrasound of the right neck was performed and demonstrates a patent internal jugular vein. A permanent ultrasound image was recorded. Using a combination of fluoroscopy and ultrasound, an access site was determined. A small dermatotomy was made at the planned puncture site. Using ultrasound guidance, access into the right internal jugular vein was obtained with visualization of needle entry into the vessel using a standard micropuncture technique. A wire was advanced into the IVC and serial fascial dilation performed. A 10 French tips sheath was placed into the internal jugular vein and advanced to the IVC. The jugular sheath was retracted into the right atrium and manometry was performed and measured to be a mean of 14 mm Hg. A 5 French angled tip catheter was then directed into the left renal vein. Left renal venogram was performed which demonstrated patency and patent inflow from a large gastro renal shunt. The gastro renal shunt was then selected. A balloon occlusion catheter was inserted and inflated to occlude the outflow. Dedicated variceal venogram was then performed which demonstrated an isolated gastric varix with no significant evidence of venous outflow to the nearby esophageal varices or portal  venous system. Transvenous obliteration was then performed with approximately 6 mL of a solution of Gel-Foam slurry, air, 3% sotradecol, and lipiodol. The occlusion balloon catheter was then flushed and coil embolization was performed at the inferior aspect of the gastro renal shunt with an assortment of 0.035" detachable Azur coils. The Fogarty catheter was then deflated and removed. Repeat left renal venogram demonstrated no evidence of reflux into the occluded gastro renal shunt. A 5 French angled tip catheter was then directed into the middle hepatic vein. Hepatic venogram was performed. These images demonstrated patent hepatic vein with no stenosis. The catheter was advanced to a wedge portion of the a patent vein over which the 10 French sheath was advanced into the middle hepatic vein. Using ICE ultrasound visualization the catheter as middle hepatic vein as well as the portal anatomy was defined. A planned exit site from the hepatic vein and puncture site from the suspected portal vein was placed into a single sonographic plane. Under direct ultrasound visualization, the ScorpionX needle was advanced into the suspected occluded portal vein. This was unsuccessful in cannulation. The needle set was removed. The left flank was then prepped and draped in standard fashion. Ultrasound demonstrated an enlarged spleen with a patent straight appearing vein near the splenic hilum which appeared appropriate for puncture. The under direct ultrasound visualization, the splenic vein was attempted to be cannulated with a 21 gauge Chiba needle making a proximally 6 separate passes, ultimately failing to gain access to the splenic vein. Completion ultrasound demonstrated no evidence of surrounding hematoma or other complicating features. Given failure of attempted trans splenic portal venous recanalization, additional attempt at transjugular transhepatic portosystemic shunt creation was attempted with ICE in fluoroscopic  guidance however from the right portal vein. A single pass was attempted at what appears to be in a track portal target in the right lobe of the liver, however there is inability to advance a wire into the structure. The needle set was removed once again. Next, attempted  cannulating the large SMV to IVC ectopic varix in the right lower quadrant was attempted. Balloon occlusion venogram was performed which demonstrated primarily right gonadal vein opacification. There is no evidence of portal opacification for possible image guided targeting. At this point, the procedure was terminated. The catheters and sheath were removed and manual compression was applied to the right internal jugular and right greater saphenous venous access sites until hemostasis was achieved. The patient was transferred to the PACU in stable condition. IMPRESSION: 1. Technically successful coil assisted retrograde transvenous obliteration of isolated large gastrorenal variceal shunt. 2. Technically unsuccessful transjugular intrahepatic portosystemic shunt creation and attempted transsplenic portal vein recanalization. 3. Technically successful right upper extremity image guided peripherally inserted central catheter placement. Marliss Coots, MD [facility] Radiology Electronically Signed   By: Marliss Coots M.D.   On: 06/30/2023 21:46   IR EMBO ART  VEN HEMORR LYMPH EXTRAV  INC GUIDE ROADMAPPING  Result Date: 06/30/2023 CLINICAL DATA:  67 year old female with history of NASH cirrhosis (Child Pugh B, MELD 20), portal hypertension, and gastroesophageal varices without history of hemorrhage. 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 She has very large gastroesophageal varices. Her history of encephalopathy and hyperbilirubinemia is compatible with  portosystemic shunt syndrome. EXAM: 1. Ultrasound guided vascular access of the right basilic vein 2. Peripherally inserted central catheter placement 3. Ultrasound-guided access of the right internal jugular vein 4. Ultrasound-guided access of the right greater saphenous vein 5. Hepatic venogram 6. Intravascular ultrasound 7. Central venous manometery 8. Coil assisted retrograde transvenous obliteration of gastrorenal varices MEDICATIONS: As antibiotic prophylaxis, Rocephin 2 gm IV was ordered pre-procedure and administered intravenously within one hour of incision. ANESTHESIA/SEDATION: General - as administered by the Anesthesia department CONTRAST:  One hundred sixty ML Omnipaque 300, intravenous FLUOROSCOPY TIME:  Fluoroscopy Time: 40 minutes (2,117 mGy). COMPLICATIONS: None immediate. PROCEDURE: Informed written consent was obtained from the patient after a thorough discussion of the procedural risks, benefits and alternatives. All questions were addressed. Maximal Sterile Barrier Technique was utilized including caps, mask, sterile gowns, sterile gloves, sterile drape, hand hygiene and skin antiseptic. A timeout was performed prior to the initiation of the procedure. Preprocedure ultrasound evaluation of the right upper extremity demonstrated a patent basilic vein. The procedure was planned. Subdermal Local anesthesia was provided at the planned needle entry site with 1% lidocaine. A small skin nick was made. Under direct ultrasound visualization, the right basilic vein was punctured with a 21 gauge micropuncture needle. A permanent ultrasound image was captured and stored in the record. A guidewire was inserted over which a 5 French peel-away sheath was placed. The wire and inner portion of the sheath were removed and a 38 cm, dual lumen 5 French peripherally inserted central catheter were then placed. Catheter tip terminates near the cavoatrial junction. Both lumens flushed and aspirated with ease. A sterile  bandage was applied. This venous access was used by the department of anesthesia throughout the procedure. A preliminary ultrasound of the right groin was performed and demonstrates a patent right common femoral vein. A permanent ultrasound image was recorded. Using a combination of fluoroscopy and ultrasound, an access site was determined. A small dermatotomy was made at the planned puncture site. Using ultrasound guidance, access into the right common femoral vein was obtained with visualization of needle entry into the vessel using a standard micropuncture  technique. A wire was advanced into the IVC insert all fascial dilation performed. An 8 [place], 11 cm vascular sheath was placed into the external iliac vein. Through this access site, an 57 [place] ICE catheter was advanced with ease under fluoroscopic guidance to the level of the intrahepatic inferior vena cava. A preliminary ultrasound of the right neck was performed and demonstrates a patent internal jugular vein. A permanent ultrasound image was recorded. Using a combination of fluoroscopy and ultrasound, an access site was determined. A small dermatotomy was made at the planned puncture site. Using ultrasound guidance, access into the right internal jugular vein was obtained with visualization of needle entry into the vessel using a standard micropuncture technique. A wire was advanced into the IVC and serial fascial dilation performed. A 10 French tips sheath was placed into the internal jugular vein and advanced to the IVC. The jugular sheath was retracted into the right atrium and manometry was performed and measured to be a mean of 14 mm Hg. A 5 French angled tip catheter was then directed into the left renal vein. Left renal venogram was performed which demonstrated patency and patent inflow from a large gastro renal shunt. The gastro renal shunt was then selected. A balloon occlusion catheter was inserted and inflated to occlude the outflow.  Dedicated variceal venogram was then performed which demonstrated an isolated gastric varix with no significant evidence of venous outflow to the nearby esophageal varices or portal venous system. Transvenous obliteration was then performed with approximately 6 mL of a solution of Gel-Foam slurry, air, 3% sotradecol, and lipiodol. The occlusion balloon catheter was then flushed and coil embolization was performed at the inferior aspect of the gastro renal shunt with an assortment of 0.035" detachable Azur coils. The Fogarty catheter was then deflated and removed. Repeat left renal venogram demonstrated no evidence of reflux into the occluded gastro renal shunt. A 5 French angled tip catheter was then directed into the middle hepatic vein. Hepatic venogram was performed. These images demonstrated patent hepatic vein with no stenosis. The catheter was advanced to a wedge portion of the a patent vein over which the 10 French sheath was advanced into the middle hepatic vein. Using ICE ultrasound visualization the catheter as middle hepatic vein as well as the portal anatomy was defined. A planned exit site from the hepatic vein and puncture site from the suspected portal vein was placed into a single sonographic plane. Under direct ultrasound visualization, the ScorpionX needle was advanced into the suspected occluded portal vein. This was unsuccessful in cannulation. The needle set was removed. The left flank was then prepped and draped in standard fashion. Ultrasound demonstrated an enlarged spleen with a patent straight appearing vein near the splenic hilum which appeared appropriate for puncture. The under direct ultrasound visualization, the splenic vein was attempted to be cannulated with a 21 gauge Chiba needle making a proximally 6 separate passes, ultimately failing to gain access to the splenic vein. Completion ultrasound demonstrated no evidence of surrounding hematoma or other complicating features. Given  failure of attempted trans splenic portal venous recanalization, additional attempt at transjugular transhepatic portosystemic shunt creation was attempted with ICE in fluoroscopic guidance however from the right portal vein. A single pass was attempted at what appears to be in a track portal target in the right lobe of the liver, however there is inability to advance a wire into the structure. The needle set was removed once again. Next, attempted cannulating the large SMV to IVC  ectopic varix in the right lower quadrant was attempted. Balloon occlusion venogram was performed which demonstrated primarily right gonadal vein opacification. There is no evidence of portal opacification for possible image guided targeting. At this point, the procedure was terminated. The catheters and sheath were removed and manual compression was applied to the right internal jugular and right greater saphenous venous access sites until hemostasis was achieved. The patient was transferred to the PACU in stable condition. IMPRESSION: 1. Technically successful coil assisted retrograde transvenous obliteration of isolated large gastrorenal variceal shunt. 2. Technically unsuccessful transjugular intrahepatic portosystemic shunt creation and attempted transsplenic portal vein recanalization. 3. Technically successful right upper extremity image guided peripherally inserted central catheter placement. Marliss Coots, MD [facility] Radiology Electronically Signed   By: Marliss Coots M.D.   On: 06/30/2023 21:46    Labs:  CBC: Recent Labs    06/01/23 1120 06/30/23 0549 06/30/23 1015 06/30/23 1525 07/01/23 0300  WBC 6.0 5.4  --  8.4 8.6  HGB 12.1 11.6* 9.5* 8.0* 7.7*  HCT 35.5* 35.0* 28.0* 24.4* 23.1*  PLT 57* 68*  --  66* 64*    COAGS: Recent Labs    03/06/23 0104 03/07/23 0059 06/01/23 1120 06/30/23 0549  INR 1.3* 1.4* 1.3* 1.2    BMP: Recent Labs    06/09/23 1238  06/15/23 1005 06/30/23 0549 06/30/23 1015 07/01/23 0300  NA 136 135 140 140 137  K 3.9 4.5 3.0* 3.1* 3.6  CL 102 103 102  --  105  CO2 27 22 23   --  23  GLUCOSE 145* 170* 137*  --  214*  BUN 23 22 15   --  24*  CALCIUM 8.4* 8.7* 9.0  --  8.6*  CREATININE 1.62* 1.48* 1.39*  --  1.97*  GFRNONAA 35* 39* 42*  --  27*    LIVER FUNCTION TESTS: Recent Labs    04/07/23 0559 06/01/23 1120 06/30/23 0549 07/01/23 0300  BILITOT 4.9* 4.4* 3.9* 3.0*  AST 53* 73* 55* 100*  ALT 28 36 32 38  ALKPHOS 77 104 95 73  PROT 5.7* 5.9* 6.6 5.4*  ALBUMIN 2.5* 2.2* 2.6* 2.4*    Assessment and Plan:  NASH Cirrhosis, portal hypertension, gastroesophageal varices, multiple systemic portosystemic shunts and portal portal vein thrombus s/p unsuccessful TIPS with coil-assisted retrograde transvenous obliteration of gastrorenal varices.   Patient with a history of atrial fibrillation/afib with RVR. She developed afib with RVR this morning and has been asymptomatic. Patient takes cardizem, metoprolol and digoxin at home. These drugs were ordered for administration this morning and were given to the patient. At the time of my assessment her heart rate was in the low 100's.   She is complaining of 5/10 epigastric/RUQ pain. Hemoglobin level dropped overnight 7.7 (11.6 >>8.0 >> 7.7). CT abdomen/pelvis without contrast ordered. Study shows no obvious signs of bleeding. Repeat labs ordered for Noon. We will continue to monitor for signs of bleeding (GIB/hematemesis) and will continue to trend labs.  Shortly after my assessment this morning the patient had one episode of vomiting and complained of worsening pain in her upper abdomen, neck and shoulder blades. PRN Oxycodone given. This did not alleviate her pain - PRN IV dilaudid ordered. 1 mg Q3 hours PRN severe pain x 4 doses.   IR will continue to monitor patient.    Electronically Signed: Alwyn Ren, AGACNP-BC [phone] 07/01/2023, 9:19 AM   I spent  a total of 25 Minutes at the the patient's bedside AND  on the patient's hospital floor or unit, greater than 50% of which was counseling/coordinating care s/p attempted TIPS with variceal embolization.

## 2023-07-01 NOTE — Plan of Care (Signed)
Problem: Fluid Volume: Goal: Hemodynamic stability will improve Outcome: Progressing   Problem: Clinical Measurements: Goal: Diagnostic test results will improve Outcome: Progressing   Problem: Clinical Measurements: Goal: Signs and symptoms of infection will decrease Outcome: Progressing   Problem: Respiratory: Goal: Ability to maintain adequate ventilation will improve Outcome: Progressing

## 2023-07-01 NOTE — Anesthesia Postprocedure Evaluation (Signed)
Anesthesia Post Note  Patient: Hannah Khan  Procedure(s) Performed: TRANS-JUGULAR INTRAHEPATIC PORTAL SHUNT (TIPS)     Patient location during evaluation: PACU Anesthesia Type: General Level of consciousness: awake and alert Pain management: pain level controlled Vital Signs Assessment: post-procedure vital signs reviewed and stable Respiratory status: spontaneous breathing, nonlabored ventilation and respiratory function stable Cardiovascular status: blood pressure returned to baseline Postop Assessment: no apparent nausea or vomiting Anesthetic complications: no   There were no known notable events for this encounter.            Shanda Howells

## 2023-07-01 NOTE — Progress Notes (Signed)
Pt is sitting at the bedside, alert 4x, no complains of  pain , no dizziness. HR sustaining @ 120s-150s. Dr Elby Showers was notified. Cardizem and metoprol po scheduled for 10 am was given.  Pt stated" I am fine". Care ongoing.

## 2023-07-02 LAB — PROTIME-INR
INR: 1.4 — ABNORMAL HIGH (ref 0.8–1.2)
Prothrombin Time: 17.7 s — ABNORMAL HIGH (ref 11.4–15.2)

## 2023-07-02 LAB — CBC
HCT: 20.8 % — ABNORMAL LOW (ref 36.0–46.0)
Hemoglobin: 7 g/dL — ABNORMAL LOW (ref 12.0–15.0)
MCH: 33.2 pg (ref 26.0–34.0)
MCHC: 33.7 g/dL (ref 30.0–36.0)
MCV: 98.6 fL (ref 80.0–100.0)
Platelets: 51 10*3/uL — ABNORMAL LOW (ref 150–400)
RBC: 2.11 MIL/uL — ABNORMAL LOW (ref 3.87–5.11)
RDW: 15.9 % — ABNORMAL HIGH (ref 11.5–15.5)
WBC: 6.6 10*3/uL (ref 4.0–10.5)
nRBC: 0.9 % — ABNORMAL HIGH (ref 0.0–0.2)

## 2023-07-02 LAB — BPAM RBC
Blood Product Expiration Date: 202411072359
Blood Product Expiration Date: 202411142359
Unit Type and Rh: 5100
Unit Type and Rh: 5100

## 2023-07-02 LAB — COMPREHENSIVE METABOLIC PANEL
ALT: 65 U/L — ABNORMAL HIGH (ref 0–44)
AST: 140 U/L — ABNORMAL HIGH (ref 15–41)
Albumin: 2.5 g/dL — ABNORMAL LOW (ref 3.5–5.0)
Alkaline Phosphatase: 109 U/L (ref 38–126)
Anion gap: 11 (ref 5–15)
BUN: 31 mg/dL — ABNORMAL HIGH (ref 8–23)
CO2: 21 mmol/L — ABNORMAL LOW (ref 22–32)
Calcium: 8.6 mg/dL — ABNORMAL LOW (ref 8.9–10.3)
Chloride: 102 mmol/L (ref 98–111)
Creatinine, Ser: 1.97 mg/dL — ABNORMAL HIGH (ref 0.44–1.00)
GFR, Estimated: 27 mL/min — ABNORMAL LOW (ref >60)
Glucose, Bld: 201 mg/dL — ABNORMAL HIGH (ref 70–99)
Potassium: 3.5 mmol/L (ref 3.5–5.1)
Sodium: 134 mmol/L — ABNORMAL LOW (ref 135–145)
Total Bilirubin: 5.1 mg/dL — ABNORMAL HIGH (ref 0.3–1.2)
Total Protein: 5.6 g/dL — ABNORMAL LOW (ref 6.5–8.1)

## 2023-07-02 LAB — TYPE AND SCREEN
ABO/RH(D): O POS
Antibody Screen: POSITIVE
Unit division: 0
Unit division: 0

## 2023-07-02 MED ORDER — ONDANSETRON HCL 4 MG PO TABS: 4.0000 mg | ORAL_TABLET | Freq: Three times a day (TID) | ORAL | 0 refills | Status: DC | PRN

## 2023-07-02 MED ORDER — OXYCODONE HCL 5 MG PO CAPS: 5.0000 mg | ORAL_CAPSULE | ORAL | 0 refills | Status: DC | PRN

## 2023-07-02 NOTE — Evaluation (Signed)
Physical Therapy Evaluation Patient Details Name: Hannah Khan MRN: [id number] DOB: April 25, 1956 Today's Date: 07/02/2023  History of Present Illness  Pt is 67 yo presenting for scheduled trans-jugular intrahepatic portal shunt. 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  Clinical Impression  Pt is presenting below baseline level of functioning. Currently pt is mostly limited by pain at incision sites and PICC line. Pt has increased HR due to afib with short in home distances of gait with RW. Pt has heavy UE assist on RW due to bil generalized weakness. Discussed going home and healing making sure to mobilize frequency every 1-2 hours while awake and then speaking with PCP about returning to OPPT. Pt is currently Mod I for bed mobility and Supervision for transfers and gait. Due to pt current functional status, home set up and available assistance no recommended skilled physical therapy services at this time.         If plan is discharge home, recommend the following: Assist for transportation;Assistance with cooking/housework;Help with stairs or ramp for entrance     Equipment Recommendations None recommended by PT     Functional Status Assessment Patient has had a recent decline in their functional status and demonstrates the ability to make significant improvements in function in a reasonable and predictable amount of time.     Precautions / Restrictions Precautions Precautions: Fall Restrictions Weight Bearing Restrictions: No      Mobility  Bed Mobility Overal bed mobility: Modified Independent     General bed mobility comments: increased time for bed mobility, Pt has poor core strength with quick "flop" on flat bed for sitting to supine     Transfers Overall transfer level: Needs assistance Equipment used: Rolling walker (2 wheels) Transfers: Sit to/from Stand, Bed to chair/wheelchair/BSC Sit to Stand: Supervision   Step pivot transfers: Supervision       General transfer comment: Supervision for safety HR up to 165 (afib) with short distance transfers and pt out of breathe.    Ambulation/Gait Ambulation/Gait assistance: Supervision Gait Distance (Feet): 80 Feet (60 ft then 80 ft of gait pt requires standing rest breaks due to shortness of breathe and HR up to 165 (afib)) Assistive device: Rolling walker (2 wheels) Gait Pattern/deviations: Step-through pattern, Decreased step length - left, Decreased step length - right Gait velocity: Decreased Gait velocity interpretation: <1.31 ft/sec, indicative of household ambulator   General Gait Details: slightly unstable, heavy use of UE on AD.  Stairs Stairs:  (pt has one step from garage. Did not perform step up today due to shortness of breathe with ambulation and pain at incision sites and PICC line. Demonstrates adequate strength to navigate stairs per home set up)              Balance Overall balance assessment: Mild deficits observed, not formally tested         Pertinent Vitals/Pain Pain Assessment Pain Assessment: 0-10 Pain Score: 8  Pain Location: in the picc line and incision under the breast and LB Pain Descriptors / Indicators: Sharp, Aching, Stabbing Pain Intervention(s): Monitored during session, Limited activity within patient's tolerance    Home Living Family/patient expects to be discharged to:: Private residence Living Arrangements: Spouse/significant other Available Help at Discharge: Family;Available 24 hours/day Type of Home: House Home Access: Stairs to enter Entrance Stairs-Rails: None Entrance Stairs-Number of  Steps: 1-2   Home Layout: One level Home Equipment: Rollator (4 wheels);Tub bench;Lift chair Additional Comments: uses  CPAP at night    Prior Function Prior Level of Function : Independent/Modified Independent       Mobility Comments: uses rollator, limited to household ambulation or will use power chair provided by stores in community. ADLs Comments: does not drive, assist for LB ADLs     Extremity/Trunk Assessment   Upper Extremity Assessment Upper Extremity Assessment: Generalized weakness    Lower Extremity Assessment Lower Extremity Assessment: Generalized weakness    Cervical / Trunk Assessment Cervical / Trunk Assessment: Normal  Communication   Communication Communication: No apparent difficulties  Cognition Arousal: Alert Behavior During Therapy: WFL for tasks assessed/performed Overall Cognitive Status: Within Functional Limits for tasks assessed     General Comments General comments (skin integrity, edema, etc.): bil L edema and hemosiderin staining.        Assessment/Plan    PT Assessment Patient needs continued PT services  PT Problem List Decreased strength;Decreased mobility;Decreased activity tolerance       PT Treatment Interventions DME instruction;Therapeutic exercise;Gait training;Balance training;Stair training;Functional mobility training;Therapeutic activities;Patient/family education    PT Goals (Current goals can be found in the Care Plan section)  Acute Rehab PT Goals Patient Stated Goal: Return home, get moving better and decrease pain PT Goal Formulation: With patient Time For Goal Achievement: 07/16/23 Potential to Achieve Goals: Good    Frequency Min 1X/week        AM-PAC PT "6 Clicks" Mobility  Outcome Measure Help needed turning from your back to your side while in a flat bed without using bedrails?: None Help needed moving from lying on your back to sitting on the side of a flat bed without using bedrails?: None Help needed moving to and from a bed to a chair (including a wheelchair)?: A Little Help needed standing up from a chair using  your arms (e.g., wheelchair or bedside chair)?: A Little Help needed to walk in hospital room?: A Little Help needed climbing 3-5 steps with a railing? : A Little 6 Click Score: 20    End of Session Equipment Utilized During Treatment: Gait belt (at axilla) Activity Tolerance: Patient tolerated treatment well;Patient limited by fatigue Patient left: in bed;with call bell/phone within reach;with family/visitor present Nurse Communication: Mobility status PT Visit Diagnosis: Other abnormalities of gait and mobility (R26.89);Muscle weakness (generalized) (M62.81)    Time: 2956-2130 PT Time Calculation (min) (ACUTE ONLY): 32 min   Charges:   PT Evaluation $PT Eval Low Complexity: 1 Low PT Treatments $Therapeutic Activity: 8-22 mins PT General Charges $$ ACUTE PT VISIT: 1 Visit         Harrel Carina, DPT, CLT  Acute Rehabilitation Services Office: [phone] (Secure chat preferred)   Claudia Desanctis 07/02/2023, 10:52 AM

## 2023-07-02 NOTE — Discharge Summary (Signed)
Patient ID: Hannah Khan MRN: [id number] DOB/AGE: 24-Aug-1956 67 y.o.  Admit date: 06/30/2023 Discharge date: 07/02/2023  Supervising Physician: Marliss Coots  Patient Status: [facility] - In-pt  Admission Diagnoses: NASH Cirrhosis, portal hypertension, gastroesophageal varices, multiple systemic portosystemic shunts and portal portal vein thrombus   Discharge Diagnoses:  Principal Problem:   Portal hypertension [facility])   Discharged Condition: good  Hospital Course:   Hannah Khan is a 67 y.o. female with history atrial fibrillation, A fib with RVR, heart failure, HTN and diabetes. She also has a history of NASH cirrhosis initially diagnosed in 2009. She was referred to Interventional Radiology by Dr. Dulce Sellar for gastroesophageal varices visualized on recent endoscopy. She first met with Dr. Elby Showers for TIPS consult 09/21/22. She denied a history of hematemesis, hematochezia, or melena. She has a history of hemorrhoids which were treated successfully. She has never had a paracentesis. She reported a single episode of profound hepatic encephalopathy years ago which required hospitalization, and has been compliant with lactulose and rifaximin since then, only experiencing very mild encephalopathy if doses are missed or delayed for various reasons. She does not drink alcohol and has no significant history of such. She takes bumex and occasionally another medication when she feels fluid overload.      On 12/23/22 Dr. Elby Showers attempted TIPS creation with transsplenic portal venous recanalization and variceal embolization however aborted the procedure due to her right atrial pressure measurement of 23 mmHg.  She has since been following up with Cardiology for further evaluation and working to lose weight.    She followed up with Dr. Elby Showers 01/31/23 and her weight was down to 321.8 (350 lb on the day of procedure). She was on Bumex and Metolazone but was experiencing hypokalemia. She was following with Dr.  Sharl Ma for bariatric management. Given her success with weight loss and fluid management plans were made to obtain a repeat echocardiogram. She's been following with Cardiology and had a right heart catheterization 05/19/23.  She follow up with Dr. Elby Showers again 05/31/23. She had lost a considerable amount of weight and had improved her fluid volume status. The right heart catheterization showed an acceptable right heart pressure for TIPS creation. Dr. Elby Showers discussed TIPS creation with possible  transsplenic portal vein recanalization, and variceal embolization including coil embolization of the gastrorenal shunt near the confluence with the left renal vein, and the SMV-->IVC varix, in addition to possibly providing sclerotherapy to the gastroesophageal varices. He had an in depth discussion with Hannah Khan regarding the risks of these procedure and she was interested in proceeding.   She presented to the [facility] Interventional Radiology department 06/30/23 for her procedure. Procedure was performed under general anesthesia with planned overnight hospitalization. TIPS creation was unsuccessful but Dr. Elby Showers was able to perform balloon occlusion retrograde transvenous obliteration of the gastric varix.     Post-procedure she developed hypotension requiring pressor support. She developed atrial fibrillation with RVR the morning after her procedure and was treated with oral doses of metoprolol and cardizem. Her hemoglobin dropped overnight and she also had complaints of epigastric/RUQ pain. CT abdomen/pelvis ordered and this showed a small to moderate volume of hemoperitoneum. A CXR was negative for acute findings. We kept her inpatient for another night and managed her pain with oral and IV pain medicine. Repeat labs yesterday afternoon showed stable findings.   This morning the patient stated she felt better but reported mild to moderate pain in the RUQ with palpation. Her heart rate  was in the  low 100's at rest. Patient stated her heart rate at home runs in the low 100's at rest. She denied nausea or shortness of breath. She complained of feeling weak and dizzy so a PT consult was ordered and performed. The Physical Therapist deemed the patient safe for discharge home without further work up since she has her husband at home for assistance. [name] husband stated he felt comfortable taking her home and confident he would be able to provide the assistance/care that she needs.   Repeat labs this morning showed a slight drop in hemoglobin level (11.6 >>8.0 >> 7.7>>7.8>>7). These results were discussed with the patient and outpatient labs (CBC and CMP) are ordered for Wednesday 07/05/23. A prescription for oxycodone was e-prescribed for PRN pain relief. Zofran was ordered for nausea. The patient will follow up with Dr. Elby Showers in one month and she will have repeat imaging (CTA BRTO) performed prior to meeting with him. The patient will call her cardiologist and PCP tomorrow to make follow up appointments as soon as possible.   The patient had all three of her procedure sites re-dressed this morning. Right upper arm PICC was removed by the IV team prior to discharge. The patient is aware to seek medical attention if she develops worsening pain, dizziness, fevers, chills, shortness of breath or other concerning symptoms. She has the number to our clinic and knows she can call our team with questions/concerns prior to her follow up visit with Dr. Elby Showers.   Consults: Physical Therapy  Significant Diagnostic Studies: CT ABDOMEN PELVIS WO CONTRAST  Result Date: 07/01/2023 CLINICAL DATA:  Postop abdominal pain. EXAM: CT ABDOMEN AND PELVIS WITHOUT CONTRAST TECHNIQUE: Multidetector CT imaging of the abdomen and pelvis was performed following the standard protocol without IV contrast. RADIATION DOSE REDUCTION: This exam was performed according to the departmental dose-optimization program which  includes automated exposure control, adjustment of the mA and/or kV according to patient size and/or use of iterative reconstruction technique. COMPARISON:  04/06/2023 FINDINGS: Lower chest: Small left effusion. Hepatobiliary: Morphologic features of the liver compatible with cirrhosis identified. This is similar to the previous exam. Isodense fluid extending over the right lobe of liver is identified. Status post cholecystectomy. Unchanged increase caliber of the CBD. Pancreas: Unremarkable. No pancreatic ductal dilatation or surrounding inflammatory changes. Spleen: Splenomegaly, unchanged. There is a small amount of isodense fluid which extends around the posterior and inferior spleen, image 47/4. Adrenals/Urinary Tract: Normal appearance of the adrenal glands. Bilateral kidney cysts are stable compared with the previous X study. No follow-up imaging recommended. No signs of obstructive uropathy. Urinary bladder is partially decompressed containing contrast material from previous imaging. Stomach/Bowel: Stomach appears normal. No pathologic dilatation of the large or small bowel loops. Signs of previous right hemicolectomy. No bowel wall thickening or inflammation. Vascular/Lymphatic: Aortic atherosclerosis. No aneurysm. Edema identified within the small bowel mesentery. Nonspecific in the setting of cirrhosis with portal venous hypertension. Similar appearance of esophageal and gastric varices. Increased by collateral vessel formation within the porta hepatic region compatible with chronic portal vein occlusion with cavernous transformation. No signs of abdominopelvic adenopathy. Reproductive: IUD identified within the uterus. There is no adnexal mass. Other: Intermediate density fluid is noted extending along bilateral pericolic gutters to the peritoneal reflections of the lower abdomen/upper pelvis. Measured in the left lower quadrant of the abdomen fluid measures 39 Hounsfield units. I would consider this a  small to moderate volume of hemoperitoneum. No focal fluid collections identified. Visualized osseous  structures are unremarkable. Musculoskeletal: No acute or significant osseous findings. IMPRESSION: 1. Interval development of small to moderate volume of hemoperitoneum within the abdomen and pelvis. This extends over the liver, spleen, along the pericolic gutters and peritoneal reflections of the lower abdomen and pelvis. 2. Cirrhosis with portal venous hypertension. Similar appearance of esophageal and gastric varices. 3. Chronic portal vein occlusion with cavernous transformation. 4. Small left pleural effusion. 5.  Aortic Atherosclerosis (ICD10-I70.0). Critical Value/emergent results were called by telephone at the time of interpretation on 07/01/2023 at 11:12 am to provider [facility] , who verbally acknowledged these results. Electronically Signed   By: Signa Kell M.D.   On: 07/01/2023 11:14   DG Chest 1 View  Result Date: 07/01/2023 CLINICAL DATA:  Shortness of breath. EXAM: CHEST  1 VIEW COMPARISON:  One-view chest x-ray 03/09/2023 FINDINGS: Heart is enlarged. Mild pulmonary vascular congestion is present without frank edema. No focal airspace disease is present. The visualized soft tissues and bony thorax are unremarkable. IMPRESSION: Cardiomegaly and mild pulmonary vascular congestion without frank edema. Electronically Signed   By: Marin Roberts M.D.   On: 07/01/2023 10:10   IR Tips  Result Date: 06/30/2023 CLINICAL DATA:  67 year old female with history of NASH cirrhosis (Child Pugh B, MELD 20), portal hypertension, and gastroesophageal varices without history of hemorrhage. 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 She has very large gastroesophageal varices. Her history of encephalopathy and hyperbilirubinemia is compatible with  portosystemic shunt syndrome. EXAM: 1. Ultrasound guided vascular access of the right basilic vein 2. Peripherally inserted central catheter placement 3. Ultrasound-guided access of the right internal jugular vein 4. Ultrasound-guided access of the right greater saphenous vein 5. Hepatic venogram 6. Intravascular ultrasound 7. Central venous manometery 8. Coil assisted retrograde transvenous obliteration of gastrorenal varices MEDICATIONS: As antibiotic prophylaxis, Rocephin 2 gm IV was ordered pre-procedure and administered intravenously within one hour of incision. ANESTHESIA/SEDATION: General - as administered by the Anesthesia department CONTRAST:  One hundred sixty ML Omnipaque 300, intravenous FLUOROSCOPY TIME:  Fluoroscopy Time: 40 minutes (2,117 mGy). COMPLICATIONS: None immediate. PROCEDURE: Informed written consent was obtained from the patient after a thorough discussion of the procedural risks, benefits and alternatives. All questions were addressed. Maximal Sterile Barrier Technique was utilized including caps, mask, sterile gowns, sterile gloves, sterile drape, hand hygiene and skin antiseptic. A timeout was performed prior to the initiation of the procedure. Preprocedure ultrasound evaluation of the right upper extremity demonstrated a patent basilic vein. The procedure was planned. Subdermal Local anesthesia was provided at the planned needle entry site with 1% lidocaine. A small skin nick was made. Under direct ultrasound visualization, the right basilic vein was punctured with a 21 gauge micropuncture needle. A permanent ultrasound image was captured and stored in the record. A guidewire was inserted over which a 5 French peel-away sheath was placed. The wire and inner portion of the sheath were removed and a 38 cm, dual lumen 5 French peripherally inserted central catheter were then placed. Catheter tip terminates near the cavoatrial junction. Both lumens flushed and aspirated with ease. A sterile  bandage was applied. This venous access was used by the department of anesthesia throughout the procedure. A preliminary ultrasound of the right groin was performed and demonstrates a patent right common femoral vein. A permanent ultrasound image was recorded. Using a combination of fluoroscopy and ultrasound, an access  site was determined. A small dermatotomy was made at the planned puncture site. Using ultrasound guidance, access into the right common femoral vein was obtained with visualization of needle entry into the vessel using a standard micropuncture technique. A wire was advanced into the IVC insert all fascial dilation performed. An 8 [place], 11 cm vascular sheath was placed into the external iliac vein. Through this access site, an 37 [place] ICE catheter was advanced with ease under fluoroscopic guidance to the level of the intrahepatic inferior vena cava. A preliminary ultrasound of the right neck was performed and demonstrates a patent internal jugular vein. A permanent ultrasound image was recorded. Using a combination of fluoroscopy and ultrasound, an access site was determined. A small dermatotomy was made at the planned puncture site. Using ultrasound guidance, access into the right internal jugular vein was obtained with visualization of needle entry into the vessel using a standard micropuncture technique. A wire was advanced into the IVC and serial fascial dilation performed. A 10 French tips sheath was placed into the internal jugular vein and advanced to the IVC. The jugular sheath was retracted into the right atrium and manometry was performed and measured to be a mean of 14 mm Hg. A 5 French angled tip catheter was then directed into the left renal vein. Left renal venogram was performed which demonstrated patency and patent inflow from a large gastro renal shunt. The gastro renal shunt was then selected. A balloon occlusion catheter was inserted and inflated to occlude the outflow.  Dedicated variceal venogram was then performed which demonstrated an isolated gastric varix with no significant evidence of venous outflow to the nearby esophageal varices or portal venous system. Transvenous obliteration was then performed with approximately 6 mL of a solution of Gel-Foam slurry, air, 3% sotradecol, and lipiodol. The occlusion balloon catheter was then flushed and coil embolization was performed at the inferior aspect of the gastro renal shunt with an assortment of 0.035" detachable Azur coils. The Fogarty catheter was then deflated and removed. Repeat left renal venogram demonstrated no evidence of reflux into the occluded gastro renal shunt. A 5 French angled tip catheter was then directed into the middle hepatic vein. Hepatic venogram was performed. These images demonstrated patent hepatic vein with no stenosis. The catheter was advanced to a wedge portion of the a patent vein over which the 10 French sheath was advanced into the middle hepatic vein. Using ICE ultrasound visualization the catheter as middle hepatic vein as well as the portal anatomy was defined. A planned exit site from the hepatic vein and puncture site from the suspected portal vein was placed into a single sonographic plane. Under direct ultrasound visualization, the ScorpionX needle was advanced into the suspected occluded portal vein. This was unsuccessful in cannulation. The needle set was removed. The left flank was then prepped and draped in standard fashion. Ultrasound demonstrated an enlarged spleen with a patent straight appearing vein near the splenic hilum which appeared appropriate for puncture. The under direct ultrasound visualization, the splenic vein was attempted to be cannulated with a 21 gauge Chiba needle making a proximally 6 separate passes, ultimately failing to gain access to the splenic vein. Completion ultrasound demonstrated no evidence of surrounding hematoma or other complicating features. Given  failure of attempted trans splenic portal venous recanalization, additional attempt at transjugular transhepatic portosystemic shunt creation was attempted with ICE in fluoroscopic guidance however from the right portal vein. A single pass was attempted at what appears to be in  a track portal target in the right lobe of the liver, however there is inability to advance a wire into the structure. The needle set was removed once again. Next, attempted cannulating the large SMV to IVC ectopic varix in the right lower quadrant was attempted. Balloon occlusion venogram was performed which demonstrated primarily right gonadal vein opacification. There is no evidence of portal opacification for possible image guided targeting. At this point, the procedure was terminated. The catheters and sheath were removed and manual compression was applied to the right internal jugular and right greater saphenous venous access sites until hemostasis was achieved. The patient was transferred to the PACU in stable condition. IMPRESSION: 1. Technically successful coil assisted retrograde transvenous obliteration of isolated large gastrorenal variceal shunt. 2. Technically unsuccessful transjugular intrahepatic portosystemic shunt creation and attempted transsplenic portal vein recanalization. 3. Technically successful right upper extremity image guided peripherally inserted central catheter placement. Marliss Coots, MD [facility] Radiology Electronically Signed   By: Marliss Coots M.D.   On: 06/30/2023 21:46   IR PICC PLACEMENT LEFT <5 YRS INC IMG GUIDE  Result Date: 06/30/2023 CLINICAL DATA:  67 year old female with history of NASH cirrhosis (Child Pugh B, MELD 20), portal hypertension, and gastroesophageal varices without history of hemorrhage. Recent CT demonstrates multiple portosystemic shunts including gastrorenal and an ectopic SMV-->IVC varix, and confirmed suspicion of main  portal vein chronic thrombus seen on 2021 CT. She has very large gastroesophageal varices. 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 EXAM: 1. Ultrasound guided vascular access of the right basilic vein 2. Peripherally inserted central catheter placement 3. Ultrasound-guided access of the right internal jugular vein 4. Ultrasound-guided access of the right greater saphenous vein 5. Hepatic venogram 6. Intravascular ultrasound 7. Central venous manometery 8. Coil assisted retrograde transvenous obliteration of gastrorenal varices MEDICATIONS: As antibiotic prophylaxis, Rocephin 2 gm IV was ordered pre-procedure and administered intravenously within one hour of incision. ANESTHESIA/SEDATION: General - as administered by the Anesthesia department CONTRAST:  One hundred sixty ML Omnipaque 300, intravenous FLUOROSCOPY TIME:  Fluoroscopy Time: 40 minutes (2,117 mGy). COMPLICATIONS: None immediate. PROCEDURE: Informed written consent was obtained from the patient after a thorough discussion of the procedural risks, benefits and alternatives. All questions were addressed. Maximal Sterile Barrier Technique was utilized including caps, mask, sterile gowns, sterile gloves, sterile drape, hand hygiene and skin antiseptic. A timeout was performed prior to the initiation of the procedure. Preprocedure ultrasound evaluation of the right upper extremity demonstrated a patent basilic vein. The procedure was planned. Subdermal Local anesthesia was provided at the planned needle entry site with 1% lidocaine. A small skin nick was made. Under direct ultrasound visualization, the right basilic vein was punctured with a 21 gauge micropuncture needle. A permanent ultrasound image was captured and stored in the record. A guidewire was inserted over which a 5 French peel-away sheath was placed. The wire and inner portion of the sheath were removed and a 38 cm, dual lumen 5 French  peripherally inserted central catheter were then placed. Catheter tip terminates near the cavoatrial junction. Both lumens flushed and aspirated with ease. A sterile bandage was applied. This venous access was used by the department of anesthesia throughout the procedure. A preliminary ultrasound of the right groin was performed and demonstrates a patent right common femoral vein. A permanent ultrasound image was recorded. Using a combination of fluoroscopy and ultrasound, an access site was determined. A small dermatotomy was made at  the planned puncture site. Using ultrasound guidance, access into the right common femoral vein was obtained with visualization of needle entry into the vessel using a standard micropuncture technique. A wire was advanced into the IVC insert all fascial dilation performed. An 8 [place], 11 cm vascular sheath was placed into the external iliac vein. Through this access site, an 63 [place] ICE catheter was advanced with ease under fluoroscopic guidance to the level of the intrahepatic inferior vena cava. A preliminary ultrasound of the right neck was performed and demonstrates a patent internal jugular vein. A permanent ultrasound image was recorded. Using a combination of fluoroscopy and ultrasound, an access site was determined. A small dermatotomy was made at the planned puncture site. Using ultrasound guidance, access into the right internal jugular vein was obtained with visualization of needle entry into the vessel using a standard micropuncture technique. A wire was advanced into the IVC and serial fascial dilation performed. A 10 French tips sheath was placed into the internal jugular vein and advanced to the IVC. The jugular sheath was retracted into the right atrium and manometry was performed and measured to be a mean of 14 mm Hg. A 5 French angled tip catheter was then directed into the left renal vein. Left renal venogram was performed which demonstrated patency and  patent inflow from a large gastro renal shunt. The gastro renal shunt was then selected. A balloon occlusion catheter was inserted and inflated to occlude the outflow. Dedicated variceal venogram was then performed which demonstrated an isolated gastric varix with no significant evidence of venous outflow to the nearby esophageal varices or portal venous system. Transvenous obliteration was then performed with approximately 6 mL of a solution of Gel-Foam slurry, air, 3% sotradecol, and lipiodol. The occlusion balloon catheter was then flushed and coil embolization was performed at the inferior aspect of the gastro renal shunt with an assortment of 0.035" detachable Azur coils. The Fogarty catheter was then deflated and removed. Repeat left renal venogram demonstrated no evidence of reflux into the occluded gastro renal shunt. A 5 French angled tip catheter was then directed into the middle hepatic vein. Hepatic venogram was performed. These images demonstrated patent hepatic vein with no stenosis. The catheter was advanced to a wedge portion of the a patent vein over which the 10 French sheath was advanced into the middle hepatic vein. Using ICE ultrasound visualization the catheter as middle hepatic vein as well as the portal anatomy was defined. A planned exit site from the hepatic vein and puncture site from the suspected portal vein was placed into a single sonographic plane. Under direct ultrasound visualization, the ScorpionX needle was advanced into the suspected occluded portal vein. This was unsuccessful in cannulation. The needle set was removed. The left flank was then prepped and draped in standard fashion. Ultrasound demonstrated an enlarged spleen with a patent straight appearing vein near the splenic hilum which appeared appropriate for puncture. The under direct ultrasound visualization, the splenic vein was attempted to be cannulated with a 21 gauge Chiba needle making a proximally 6 separate  passes, ultimately failing to gain access to the splenic vein. Completion ultrasound demonstrated no evidence of surrounding hematoma or other complicating features. Given failure of attempted trans splenic portal venous recanalization, additional attempt at transjugular transhepatic portosystemic shunt creation was attempted with ICE in fluoroscopic guidance however from the right portal vein. A single pass was attempted at what appears to be in a track portal target in the right lobe of  the liver, however there is inability to advance a wire into the structure. The needle set was removed once again. Next, attempted cannulating the large SMV to IVC ectopic varix in the right lower quadrant was attempted. Balloon occlusion venogram was performed which demonstrated primarily right gonadal vein opacification. There is no evidence of portal opacification for possible image guided targeting. At this point, the procedure was terminated. The catheters and sheath were removed and manual compression was applied to the right internal jugular and right greater saphenous venous access sites until hemostasis was achieved. The patient was transferred to the PACU in stable condition. IMPRESSION: 1. Technically successful coil assisted retrograde transvenous obliteration of isolated large gastrorenal variceal shunt. 2. Technically unsuccessful transjugular intrahepatic portosystemic shunt creation and attempted transsplenic portal vein recanalization. 3. Technically successful right upper extremity image guided peripherally inserted central catheter placement. Marliss Coots, MD [facility] Radiology Electronically Signed   By: Marliss Coots M.D.   On: 06/30/2023 21:46   IR US Guide Vasc Access Right  Result Date: 06/30/2023 CLINICAL DATA:  67 year old female with history of NASH cirrhosis (Child Pugh B, MELD 20), portal hypertension, and gastroesophageal varices without history  of hemorrhage. 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 She has very large gastroesophageal varices. 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 EXAM: 1. Ultrasound guided vascular access of the right basilic vein 2. Peripherally inserted central catheter placement 3. Ultrasound-guided access of the right internal jugular vein 4. Ultrasound-guided access of the right greater saphenous vein 5. Hepatic venogram 6. Intravascular ultrasound 7. Central venous manometery 8. Coil assisted retrograde transvenous obliteration of gastrorenal varices MEDICATIONS: As antibiotic prophylaxis, Rocephin 2 gm IV was ordered pre-procedure and administered intravenously within one hour of incision. ANESTHESIA/SEDATION: General - as administered by the Anesthesia department CONTRAST:  One hundred sixty ML Omnipaque 300, intravenous FLUOROSCOPY TIME:  Fluoroscopy Time: 40 minutes (2,117 mGy). COMPLICATIONS: None immediate. PROCEDURE: Informed written consent was obtained from the patient after a thorough discussion of the procedural risks, benefits and alternatives. All questions were addressed. Maximal Sterile Barrier Technique was utilized including caps, mask, sterile gowns, sterile gloves, sterile drape, hand hygiene and skin antiseptic. A timeout was performed prior to the initiation of the procedure. Preprocedure ultrasound evaluation of the right upper extremity demonstrated a patent basilic vein. The procedure was planned. Subdermal Local anesthesia was provided at the planned needle entry site with 1% lidocaine. A small skin nick was made. Under direct ultrasound visualization, the right basilic vein was punctured with a 21 gauge micropuncture needle. A permanent ultrasound image was captured and stored in the record. A guidewire  was inserted over which a 5 French peel-away sheath was placed. The wire and inner portion of the sheath were removed and a 38 cm, dual lumen 5 French peripherally inserted central catheter were then placed. Catheter tip terminates near the cavoatrial junction. Both lumens flushed and aspirated with ease. A sterile bandage was applied. This venous access was used by the department of anesthesia throughout the procedure. A preliminary ultrasound of the right groin was performed and demonstrates a patent right common femoral vein. A permanent ultrasound image was recorded. Using a combination of fluoroscopy and ultrasound, an access site was determined. A small dermatotomy was made at the planned puncture site. Using ultrasound guidance, access into the right common  femoral vein was obtained with visualization of needle entry into the vessel using a standard micropuncture technique. A wire was advanced into the IVC insert all fascial dilation performed. An 8 [place], 11 cm vascular sheath was placed into the external iliac vein. Through this access site, an 39 [place] ICE catheter was advanced with ease under fluoroscopic guidance to the level of the intrahepatic inferior vena cava. A preliminary ultrasound of the right neck was performed and demonstrates a patent internal jugular vein. A permanent ultrasound image was recorded. Using a combination of fluoroscopy and ultrasound, an access site was determined. A small dermatotomy was made at the planned puncture site. Using ultrasound guidance, access into the right internal jugular vein was obtained with visualization of needle entry into the vessel using a standard micropuncture technique. A wire was advanced into the IVC and serial fascial dilation performed. A 10 French tips sheath was placed into the internal jugular vein and advanced to the IVC. The jugular sheath was retracted into the right atrium and manometry was performed and measured to be a mean of 14  mm Hg. A 5 French angled tip catheter was then directed into the left renal vein. Left renal venogram was performed which demonstrated patency and patent inflow from a large gastro renal shunt. The gastro renal shunt was then selected. A balloon occlusion catheter was inserted and inflated to occlude the outflow. Dedicated variceal venogram was then performed which demonstrated an isolated gastric varix with no significant evidence of venous outflow to the nearby esophageal varices or portal venous system. Transvenous obliteration was then performed with approximately 6 mL of a solution of Gel-Foam slurry, air, 3% sotradecol, and lipiodol. The occlusion balloon catheter was then flushed and coil embolization was performed at the inferior aspect of the gastro renal shunt with an assortment of 0.035" detachable Azur coils. The Fogarty catheter was then deflated and removed. Repeat left renal venogram demonstrated no evidence of reflux into the occluded gastro renal shunt. A 5 French angled tip catheter was then directed into the middle hepatic vein. Hepatic venogram was performed. These images demonstrated patent hepatic vein with no stenosis. The catheter was advanced to a wedge portion of the a patent vein over which the 10 French sheath was advanced into the middle hepatic vein. Using ICE ultrasound visualization the catheter as middle hepatic vein as well as the portal anatomy was defined. A planned exit site from the hepatic vein and puncture site from the suspected portal vein was placed into a single sonographic plane. Under direct ultrasound visualization, the ScorpionX needle was advanced into the suspected occluded portal vein. This was unsuccessful in cannulation. The needle set was removed. The left flank was then prepped and draped in standard fashion. Ultrasound demonstrated an enlarged spleen with a patent straight appearing vein near the splenic hilum which appeared appropriate for puncture. The under  direct ultrasound visualization, the splenic vein was attempted to be cannulated with a 21 gauge Chiba needle making a proximally 6 separate passes, ultimately failing to gain access to the splenic vein. Completion ultrasound demonstrated no evidence of surrounding hematoma or other complicating features. Given failure of attempted trans splenic portal venous recanalization, additional attempt at transjugular transhepatic portosystemic shunt creation was attempted with ICE in fluoroscopic guidance however from the right portal vein. A single pass was attempted at what appears to be in a track portal target in the right lobe of the liver, however there is inability to advance a wire into the  structure. The needle set was removed once again. Next, attempted cannulating the large SMV to IVC ectopic varix in the right lower quadrant was attempted. Balloon occlusion venogram was performed which demonstrated primarily right gonadal vein opacification. There is no evidence of portal opacification for possible image guided targeting. At this point, the procedure was terminated. The catheters and sheath were removed and manual compression was applied to the right internal jugular and right greater saphenous venous access sites until hemostasis was achieved. The patient was transferred to the PACU in stable condition. IMPRESSION: 1. Technically successful coil assisted retrograde transvenous obliteration of isolated large gastrorenal variceal shunt. 2. Technically unsuccessful transjugular intrahepatic portosystemic shunt creation and attempted transsplenic portal vein recanalization. 3. Technically successful right upper extremity image guided peripherally inserted central catheter placement. Marliss Coots, MD [facility] Radiology Electronically Signed   By: Marliss Coots M.D.   On: 06/30/2023 21:46   IR US Guide Vasc Access Right  Result Date: 06/30/2023 CLINICAL DATA:   67 year old female with history of NASH cirrhosis (Child Pugh B, MELD 20), portal hypertension, and gastroesophageal varices without history of hemorrhage. 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 She has very large gastroesophageal varices. 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 EXAM: 1. Ultrasound guided vascular access of the right basilic vein 2. Peripherally inserted central catheter placement 3. Ultrasound-guided access of the right internal jugular vein 4. Ultrasound-guided access of the right greater saphenous vein 5. Hepatic venogram 6. Intravascular ultrasound 7. Central venous manometery 8. Coil assisted retrograde transvenous obliteration of gastrorenal varices MEDICATIONS: As antibiotic prophylaxis, Rocephin 2 gm IV was ordered pre-procedure and administered intravenously within one hour of incision. ANESTHESIA/SEDATION: General - as administered by the Anesthesia department CONTRAST:  One hundred sixty ML Omnipaque 300, intravenous FLUOROSCOPY TIME:  Fluoroscopy Time: 40 minutes (2,117 mGy). COMPLICATIONS: None immediate. PROCEDURE: Informed written consent was obtained from the patient after a thorough discussion of the procedural risks, benefits and alternatives. All questions were addressed. Maximal Sterile Barrier Technique was utilized including caps, mask, sterile gowns, sterile gloves, sterile drape, hand hygiene and skin antiseptic. A timeout was performed prior to the initiation of the procedure. Preprocedure ultrasound evaluation of the right upper extremity demonstrated a patent basilic vein. The procedure was planned. Subdermal Local anesthesia was provided at the planned needle entry site with 1% lidocaine. A small skin nick was made. Under direct ultrasound visualization, the right  basilic vein was punctured with a 21 gauge micropuncture needle. A permanent ultrasound image was captured and stored in the record. A guidewire was inserted over which a 5 French peel-away sheath was placed. The wire and inner portion of the sheath were removed and a 38 cm, dual lumen 5 French peripherally inserted central catheter were then placed. Catheter tip terminates near the cavoatrial junction. Both lumens flushed and aspirated with ease. A sterile bandage was applied. This venous access was used by the department of anesthesia throughout the procedure. A preliminary ultrasound of the right groin was performed and demonstrates a patent right common femoral vein. A permanent ultrasound image was recorded. Using a combination of fluoroscopy and ultrasound, an access site was determined. A small dermatotomy was made at the planned puncture site. Using ultrasound guidance, access into the right common femoral vein was obtained with visualization of needle entry into the vessel  using a standard micropuncture technique. A wire was advanced into the IVC insert all fascial dilation performed. An 8 [place], 11 cm vascular sheath was placed into the external iliac vein. Through this access site, an 40 [place] ICE catheter was advanced with ease under fluoroscopic guidance to the level of the intrahepatic inferior vena cava. A preliminary ultrasound of the right neck was performed and demonstrates a patent internal jugular vein. A permanent ultrasound image was recorded. Using a combination of fluoroscopy and ultrasound, an access site was determined. A small dermatotomy was made at the planned puncture site. Using ultrasound guidance, access into the right internal jugular vein was obtained with visualization of needle entry into the vessel using a standard micropuncture technique. A wire was advanced into the IVC and serial fascial dilation performed. A 10 French tips sheath was placed into the internal jugular  vein and advanced to the IVC. The jugular sheath was retracted into the right atrium and manometry was performed and measured to be a mean of 14 mm Hg. A 5 French angled tip catheter was then directed into the left renal vein. Left renal venogram was performed which demonstrated patency and patent inflow from a large gastro renal shunt. The gastro renal shunt was then selected. A balloon occlusion catheter was inserted and inflated to occlude the outflow. Dedicated variceal venogram was then performed which demonstrated an isolated gastric varix with no significant evidence of venous outflow to the nearby esophageal varices or portal venous system. Transvenous obliteration was then performed with approximately 6 mL of a solution of Gel-Foam slurry, air, 3% sotradecol, and lipiodol. The occlusion balloon catheter was then flushed and coil embolization was performed at the inferior aspect of the gastro renal shunt with an assortment of 0.035" detachable Azur coils. The Fogarty catheter was then deflated and removed. Repeat left renal venogram demonstrated no evidence of reflux into the occluded gastro renal shunt. A 5 French angled tip catheter was then directed into the middle hepatic vein. Hepatic venogram was performed. These images demonstrated patent hepatic vein with no stenosis. The catheter was advanced to a wedge portion of the a patent vein over which the 10 French sheath was advanced into the middle hepatic vein. Using ICE ultrasound visualization the catheter as middle hepatic vein as well as the portal anatomy was defined. A planned exit site from the hepatic vein and puncture site from the suspected portal vein was placed into a single sonographic plane. Under direct ultrasound visualization, the ScorpionX needle was advanced into the suspected occluded portal vein. This was unsuccessful in cannulation. The needle set was removed. The left flank was then prepped and draped in standard fashion. Ultrasound  demonstrated an enlarged spleen with a patent straight appearing vein near the splenic hilum which appeared appropriate for puncture. The under direct ultrasound visualization, the splenic vein was attempted to be cannulated with a 21 gauge Chiba needle making a proximally 6 separate passes, ultimately failing to gain access to the splenic vein. Completion ultrasound demonstrated no evidence of surrounding hematoma or other complicating features. Given failure of attempted trans splenic portal venous recanalization, additional attempt at transjugular transhepatic portosystemic shunt creation was attempted with ICE in fluoroscopic guidance however from the right portal vein. A single pass was attempted at what appears to be in a track portal target in the right lobe of the liver, however there is inability to advance a wire into the structure. The needle set was removed once again. Next, attempted cannulating the  large SMV to IVC ectopic varix in the right lower quadrant was attempted. Balloon occlusion venogram was performed which demonstrated primarily right gonadal vein opacification. There is no evidence of portal opacification for possible image guided targeting. At this point, the procedure was terminated. The catheters and sheath were removed and manual compression was applied to the right internal jugular and right greater saphenous venous access sites until hemostasis was achieved. The patient was transferred to the PACU in stable condition. IMPRESSION: 1. Technically successful coil assisted retrograde transvenous obliteration of isolated large gastrorenal variceal shunt. 2. Technically unsuccessful transjugular intrahepatic portosystemic shunt creation and attempted transsplenic portal vein recanalization. 3. Technically successful right upper extremity image guided peripherally inserted central catheter placement. Marliss Coots, MD [facility] Radiology  Electronically Signed   By: Marliss Coots M.D.   On: 06/30/2023 21:46   IR US Guide Vasc Access Left  Result Date: 06/30/2023 CLINICAL DATA:  67 year old female with history of NASH cirrhosis (Child Pugh B, MELD 20), portal hypertension, and gastroesophageal varices without history of hemorrhage. 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 She has very large gastroesophageal varices. 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 EXAM: 1. Ultrasound guided vascular access of the right basilic vein 2. Peripherally inserted central catheter placement 3. Ultrasound-guided access of the right internal jugular vein 4. Ultrasound-guided access of the right greater saphenous vein 5. Hepatic venogram 6. Intravascular ultrasound 7. Central venous manometery 8. Coil assisted retrograde transvenous obliteration of gastrorenal varices MEDICATIONS: As antibiotic prophylaxis, Rocephin 2 gm IV was ordered pre-procedure and administered intravenously within one hour of incision. ANESTHESIA/SEDATION: General - as administered by the Anesthesia department CONTRAST:  One hundred sixty ML Omnipaque 300, intravenous FLUOROSCOPY TIME:  Fluoroscopy Time: 40 minutes (2,117 mGy). COMPLICATIONS: None immediate. PROCEDURE: Informed written consent was obtained from the patient after a thorough discussion of the procedural risks, benefits and alternatives. All questions were addressed. Maximal Sterile Barrier Technique was utilized including caps, mask, sterile gowns, sterile gloves, sterile drape, hand hygiene and skin antiseptic. A timeout was performed prior to the initiation of the procedure. Preprocedure ultrasound evaluation of the right upper extremity demonstrated a patent basilic vein. The procedure was planned. Subdermal Local  anesthesia was provided at the planned needle entry site with 1% lidocaine. A small skin nick was made. Under direct ultrasound visualization, the right basilic vein was punctured with a 21 gauge micropuncture needle. A permanent ultrasound image was captured and stored in the record. A guidewire was inserted over which a 5 French peel-away sheath was placed. The wire and inner portion of the sheath were removed and a 38 cm, dual lumen 5 French peripherally inserted central catheter were then placed. Catheter tip terminates near the cavoatrial junction. Both lumens flushed and aspirated with ease. A sterile bandage was applied. This venous access was used by the department of anesthesia throughout the procedure. A preliminary ultrasound of the right groin was performed and demonstrates a patent right common femoral vein. A permanent ultrasound image was recorded. Using a combination of fluoroscopy and ultrasound, an access site was determined. A small dermatotomy was made at the planned puncture site. Using ultrasound guidance, access into the right common femoral vein was obtained with visualization of needle entry into the vessel using a standard micropuncture technique. A wire was advanced into the IVC  insert all fascial dilation performed. An 8 [place], 11 cm vascular sheath was placed into the external iliac vein. Through this access site, an 18 [place] ICE catheter was advanced with ease under fluoroscopic guidance to the level of the intrahepatic inferior vena cava. A preliminary ultrasound of the right neck was performed and demonstrates a patent internal jugular vein. A permanent ultrasound image was recorded. Using a combination of fluoroscopy and ultrasound, an access site was determined. A small dermatotomy was made at the planned puncture site. Using ultrasound guidance, access into the right internal jugular vein was obtained with visualization of needle entry into the vessel using a standard  micropuncture technique. A wire was advanced into the IVC and serial fascial dilation performed. A 10 French tips sheath was placed into the internal jugular vein and advanced to the IVC. The jugular sheath was retracted into the right atrium and manometry was performed and measured to be a mean of 14 mm Hg. A 5 French angled tip catheter was then directed into the left renal vein. Left renal venogram was performed which demonstrated patency and patent inflow from a large gastro renal shunt. The gastro renal shunt was then selected. A balloon occlusion catheter was inserted and inflated to occlude the outflow. Dedicated variceal venogram was then performed which demonstrated an isolated gastric varix with no significant evidence of venous outflow to the nearby esophageal varices or portal venous system. Transvenous obliteration was then performed with approximately 6 mL of a solution of Gel-Foam slurry, air, 3% sotradecol, and lipiodol. The occlusion balloon catheter was then flushed and coil embolization was performed at the inferior aspect of the gastro renal shunt with an assortment of 0.035" detachable Azur coils. The Fogarty catheter was then deflated and removed. Repeat left renal venogram demonstrated no evidence of reflux into the occluded gastro renal shunt. A 5 French angled tip catheter was then directed into the middle hepatic vein. Hepatic venogram was performed. These images demonstrated patent hepatic vein with no stenosis. The catheter was advanced to a wedge portion of the a patent vein over which the 10 French sheath was advanced into the middle hepatic vein. Using ICE ultrasound visualization the catheter as middle hepatic vein as well as the portal anatomy was defined. A planned exit site from the hepatic vein and puncture site from the suspected portal vein was placed into a single sonographic plane. Under direct ultrasound visualization, the ScorpionX needle was advanced into the suspected  occluded portal vein. This was unsuccessful in cannulation. The needle set was removed. The left flank was then prepped and draped in standard fashion. Ultrasound demonstrated an enlarged spleen with a patent straight appearing vein near the splenic hilum which appeared appropriate for puncture. The under direct ultrasound visualization, the splenic vein was attempted to be cannulated with a 21 gauge Chiba needle making a proximally 6 separate passes, ultimately failing to gain access to the splenic vein. Completion ultrasound demonstrated no evidence of surrounding hematoma or other complicating features. Given failure of attempted trans splenic portal venous recanalization, additional attempt at transjugular transhepatic portosystemic shunt creation was attempted with ICE in fluoroscopic guidance however from the right portal vein. A single pass was attempted at what appears to be in a track portal target in the right lobe of the liver, however there is inability to advance a wire into the structure. The needle set was removed once again. Next, attempted cannulating the large SMV to IVC ectopic varix in the right lower quadrant was  attempted. Balloon occlusion venogram was performed which demonstrated primarily right gonadal vein opacification. There is no evidence of portal opacification for possible image guided targeting. At this point, the procedure was terminated. The catheters and sheath were removed and manual compression was applied to the right internal jugular and right greater saphenous venous access sites until hemostasis was achieved. The patient was transferred to the PACU in stable condition. IMPRESSION: 1. Technically successful coil assisted retrograde transvenous obliteration of isolated large gastrorenal variceal shunt. 2. Technically unsuccessful transjugular intrahepatic portosystemic shunt creation and attempted transsplenic portal vein recanalization. 3. Technically successful right upper  extremity image guided peripherally inserted central catheter placement. Marliss Coots, MD [facility] Electronically Signed   By: Marliss Coots M.D.   On: 06/30/2023 21:46   IR EMBO ART  VEN HEMORR LYMPH EXTRAV  INC GUIDE ROADMAPPING  Result Date: 06/30/2023 CLINICAL DATA:  67 year old female with history of NASH cirrhosis (Child Pugh B, MELD 20), portal hypertension, and gastroesophageal varices without history of hemorrhage. 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 She has very large gastroesophageal varices. 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 EXAM: 1. Ultrasound guided vascular access of the right basilic vein 2. Peripherally inserted central catheter placement 3. Ultrasound-guided access of the right internal jugular vein 4. Ultrasound-guided access of the right greater saphenous vein 5. Hepatic venogram 6. Intravascular ultrasound 7. Central venous manometery 8. Coil assisted retrograde transvenous obliteration of gastrorenal varices MEDICATIONS: As antibiotic prophylaxis, Rocephin 2 gm IV was ordered pre-procedure and administered intravenously within one hour of incision. ANESTHESIA/SEDATION: General - as administered by the Anesthesia department CONTRAST:  One hundred sixty ML Omnipaque 300, intravenous FLUOROSCOPY TIME:  Fluoroscopy Time: 40 minutes (2,117 mGy). COMPLICATIONS: None immediate. PROCEDURE: Informed written consent was obtained from the patient after a thorough discussion of the procedural risks, benefits and alternatives. All questions were addressed. Maximal Sterile Barrier Technique was utilized including caps, mask, sterile gowns, sterile gloves, sterile drape, hand hygiene and skin antiseptic. A timeout was performed  prior to the initiation of the procedure. Preprocedure ultrasound evaluation of the right upper extremity demonstrated a patent basilic vein. The procedure was planned. Subdermal Local anesthesia was provided at the planned needle entry site with 1% lidocaine. A small skin nick was made. Under direct ultrasound visualization, the right basilic vein was punctured with a 21 gauge micropuncture needle. A permanent ultrasound image was captured and stored in the record. A guidewire was inserted over which a 5 French peel-away sheath was placed. The wire and inner portion of the sheath were removed and a 38 cm, dual lumen 5 French peripherally inserted central catheter were then placed. Catheter tip terminates near the cavoatrial junction. Both lumens flushed and aspirated with ease. A sterile bandage was applied. This venous access was used by the department of anesthesia throughout the procedure. A preliminary ultrasound of the right groin was performed and demonstrates a patent right common femoral vein. A permanent ultrasound image was recorded. Using a combination of fluoroscopy and ultrasound, an access site was determined. A small dermatotomy was made at the planned puncture site. Using ultrasound guidance, access into the right common femoral vein was obtained with visualization of needle entry into the vessel using a standard micropuncture technique. A wire was advanced into the IVC insert all fascial dilation performed. An  8 French, 11 cm vascular sheath was placed into the external iliac vein. Through this access site, an 19 [place] ICE catheter was advanced with ease under fluoroscopic guidance to the level of the intrahepatic inferior vena cava. A preliminary ultrasound of the right neck was performed and demonstrates a patent internal jugular vein. A permanent ultrasound image was recorded. Using a combination of fluoroscopy and ultrasound, an access site was determined. A small dermatotomy was made  at the planned puncture site. Using ultrasound guidance, access into the right internal jugular vein was obtained with visualization of needle entry into the vessel using a standard micropuncture technique. A wire was advanced into the IVC and serial fascial dilation performed. A 10 French tips sheath was placed into the internal jugular vein and advanced to the IVC. The jugular sheath was retracted into the right atrium and manometry was performed and measured to be a mean of 14 mm Hg. A 5 French angled tip catheter was then directed into the left renal vein. Left renal venogram was performed which demonstrated patency and patent inflow from a large gastro renal shunt. The gastro renal shunt was then selected. A balloon occlusion catheter was inserted and inflated to occlude the outflow. Dedicated variceal venogram was then performed which demonstrated an isolated gastric varix with no significant evidence of venous outflow to the nearby esophageal varices or portal venous system. Transvenous obliteration was then performed with approximately 6 mL of a solution of Gel-Foam slurry, air, 3% sotradecol, and lipiodol. The occlusion balloon catheter was then flushed and coil embolization was performed at the inferior aspect of the gastro renal shunt with an assortment of 0.035" detachable Azur coils. The Fogarty catheter was then deflated and removed. Repeat left renal venogram demonstrated no evidence of reflux into the occluded gastro renal shunt. A 5 French angled tip catheter was then directed into the middle hepatic vein. Hepatic venogram was performed. These images demonstrated patent hepatic vein with no stenosis. The catheter was advanced to a wedge portion of the a patent vein over which the 10 French sheath was advanced into the middle hepatic vein. Using ICE ultrasound visualization the catheter as middle hepatic vein as well as the portal anatomy was defined. A planned exit site from the hepatic vein and  puncture site from the suspected portal vein was placed into a single sonographic plane. Under direct ultrasound visualization, the ScorpionX needle was advanced into the suspected occluded portal vein. This was unsuccessful in cannulation. The needle set was removed. The left flank was then prepped and draped in standard fashion. Ultrasound demonstrated an enlarged spleen with a patent straight appearing vein near the splenic hilum which appeared appropriate for puncture. The under direct ultrasound visualization, the splenic vein was attempted to be cannulated with a 21 gauge Chiba needle making a proximally 6 separate passes, ultimately failing to gain access to the splenic vein. Completion ultrasound demonstrated no evidence of surrounding hematoma or other complicating features. Given failure of attempted trans splenic portal venous recanalization, additional attempt at transjugular transhepatic portosystemic shunt creation was attempted with ICE in fluoroscopic guidance however from the right portal vein. A single pass was attempted at what appears to be in a track portal target in the right lobe of the liver, however there is inability to advance a wire into the structure. The needle set was removed once again. Next, attempted cannulating the large SMV to IVC ectopic varix in the right lower quadrant was attempted. Balloon occlusion venogram was performed  which demonstrated primarily right gonadal vein opacification. There is no evidence of portal opacification for possible image guided targeting. At this point, the procedure was terminated. The catheters and sheath were removed and manual compression was applied to the right internal jugular and right greater saphenous venous access sites until hemostasis was achieved. The patient was transferred to the PACU in stable condition. IMPRESSION: 1. Technically successful coil assisted retrograde transvenous obliteration of isolated large gastrorenal variceal  shunt. 2. Technically unsuccessful transjugular intrahepatic portosystemic shunt creation and attempted transsplenic portal vein recanalization. 3. Technically successful right upper extremity image guided peripherally inserted central catheter placement. Marliss Coots, MD [facility] Electronically Signed   By: Marliss Coots M.D.   On: 06/30/2023 21:46    Treatments: Observation, supportive care, physical therapy evaluation   Discharge Exam: Blood pressure (!) 125/34, pulse (!) 108, temperature 98.1 F (36.7 C), temperature source Oral, resp. rate 17, height 5\' 7"  (1.702 m), weight (!) 331 lb 2.1 oz (150.2 kg), SpO2 96%. Physical Exam Constitutional:      General: She is not in acute distress.    Appearance: She is obese. She is not ill-appearing.  HENT:     Mouth/Throat:     Mouth: Mucous membranes are moist.     Pharynx: Oropharynx is clear.  Cardiovascular:     Rate and Rhythm: Tachycardia present. Rhythm irregular.     Heart sounds: Murmur heard.     Comments: Right femoral vascular access site is clean, soft and dry. Site is without erythema, drainage or tenderness. Right neck vascular site is tender but is otherwise unremarkable. Right upper arm PICC site is tender. Left flank/abdomen procedure site is clean, soft, dry and non-tender.  Pulmonary:     Effort: Pulmonary effort is normal.     Comments: Decreased breath sounds bilaterally.  Abdominal:     Palpations: Abdomen is soft.     Tenderness: There is abdominal tenderness.  Musculoskeletal:     Right lower leg: Edema present.     Left lower leg: Edema present.  Skin:    General: Skin is warm and dry.  Neurological:     Mental Status: She is alert and oriented to person, place, and time.  Psychiatric:        Mood and Affect: Mood normal.        Behavior: Behavior normal.        Thought Content: Thought content normal.        Judgment: Judgment normal.      Disposition: Discharge disposition: 01-Home or Self Care    Allergies as of 07/02/2023       Reactions   Celebrex [celecoxib] Other (See Comments)   "speech slurred" with celebrex   Shellfish Allergy Swelling, Rash   TINGLING AND SWELLING OF LIPS. LARGE WHELPS QUARTER-SIZE   Barium-containing Compounds Other (See Comments)   TACHYCARDIA   Prednisone Other (See Comments)   TACHYCARDIA   Statins Other (See Comments)   AGGRAVATED FIBROMYALGIA   Fentanyl    Hallucinations    Ibuprofen Other (See Comments)   UNSPECIFIED SPECIFIC REACTION >> "DUE TO PLATELETS"   Sglt2 Inhibitors    Cellulitis of pannus   Tramadol    Hallucination   Tylenol [acetaminophen] Other (See Comments)   UNSPECIFIED SPECIFIC REACTION >> "DUE TO PLATELETS"   Zetia [ezetimibe]    Headache        Medication List     TAKE these medications    albuterol 108 (  90 Base) MCG/ACT inhaler Commonly known as: VENTOLIN HFA Inhale 1-2 puffs into the lungs every 6 (six) hours as needed for wheezing or shortness of breath.   digoxin 0.125 MG tablet Commonly known as: LANOXIN Take 1 tablet (0.125 mg total) by mouth daily.   diltiazem 180 MG 24 hr capsule Commonly known as: CARDIZEM CD Take 1 capsule (180 mg total) by mouth daily.   doxycycline 100 MG tablet Commonly known as: ADOXA Take 100 mg by mouth 2 (two) times daily. Per patient last dose will be Friday 06-30-23   lactulose 10 GM/15ML solution Commonly known as: CHRONULAC Take 20 g by mouth 3 (three) times daily.   metolazone 5 MG tablet Commonly known as: ZAROXOLYN Take 1 tablet (5 mg total) by mouth once a week. Take one tablet on Sundays 30 minutes before Bumex   metoprolol tartrate 25 MG tablet Commonly known as: LOPRESSOR Take 0.5 tablets (12.5 mg total) by mouth 2 (two) times daily.   midodrine 10 MG tablet Commonly known as: PROAMATINE Take 1 tablet (10 mg total) by mouth 3 (three) times daily.   ondansetron 4 MG  tablet Commonly known as: Zofran Take 1 tablet (4 mg total) by mouth every 8 (eight) hours as needed for up to 7 days for nausea or vomiting.   OneTouch Delica Plus Lancet33G Misc Apply topically 3 (three) times daily.   OneTouch Verio test strip Generic drug: glucose blood 1 each 3 (three) times daily.   oxycodone 5 MG capsule Commonly known as: OXY-IR Take 1 capsule (5 mg total) by mouth every 4 (four) hours as needed for up to 5 days for pain.   potassium chloride 10 MEQ CR capsule Commonly known as: MICRO-K Take 10 capsules (100 mEq total) by mouth 2 (two) times daily. Take an extra 4 tablets when taking Metolazone.   PreserVision AREDS 2 Caps Take 1 capsule by mouth 2 (two) times daily.   Salonpas Lidocaine Plus 4-10 % Crea Generic drug: Lidocaine HCl-Benzyl Alcohol Apply 1 Application topically daily as needed (knee pain).   Systane Balance 0.6 % Soln Generic drug: Propylene Glycol Place 1 drop into both eyes 2 (two) times daily.   torsemide 20 MG tablet Commonly known as: DEMADEX Take 4 tablets (80 mg total) by mouth every morning AND 3 tablets (60 mg total) every evening.   Xifaxan 550 MG Tabs tablet Generic drug: rifaximin Take 550 mg by mouth 2 (two) times daily.        Follow-up Information     [facility] Follow up.   Why: Please follow up with Dr. Elby Showers in one month with a clinic visit and CT imaging. A scheduler from our office will call you with the dates/times of your appointments. Please call our office with any questions prior to your visit. Contact information: [address] [place] [address] [phone]                  Electronically Signed: Mickie Kay, NP 07/02/2023, 1:30 PM   I have spent Greater Than 30 Minutes discharging Hannah Khan.

## 2023-07-02 NOTE — Progress Notes (Signed)
PICC line removed per order and with no complications.  Pressure held to achieve hemostasis.  Vaseline/gauze/tegaderm applied.  Aftercare instructions reviewed with patient including activity restrictions and signs of infection.  Pt verbalized understanding and had no questions.

## 2023-07-03 ENCOUNTER — Emergency Department (HOSPITAL_COMMUNITY): Payer: Medicare PPO

## 2023-07-03 ENCOUNTER — Encounter (HOSPITAL_COMMUNITY): Payer: Self-pay | Admitting: Interventional Radiology

## 2023-07-03 ENCOUNTER — Inpatient Hospital Stay (HOSPITAL_COMMUNITY)
Admission: EM | Admit: 2023-07-03 | Discharge: 2023-07-11 | DRG: 314 | Disposition: A | Discharge status: Home Health | Payer: Medicare PPO | Attending: Internal Medicine | Admitting: Internal Medicine

## 2023-07-03 ENCOUNTER — Other Ambulatory Visit: Payer: Self-pay

## 2023-07-03 DIAGNOSIS — R000 Tachycardia, unspecified: Secondary | ICD-10-CM | POA: Diagnosis not present

## 2023-07-03 DIAGNOSIS — E872 Acidosis, unspecified: Secondary | ICD-10-CM | POA: Diagnosis present

## 2023-07-03 DIAGNOSIS — I81 Portal vein thrombosis: Secondary | ICD-10-CM | POA: Diagnosis present

## 2023-07-03 DIAGNOSIS — I509 Heart failure, unspecified: Secondary | ICD-10-CM | POA: Diagnosis not present

## 2023-07-03 DIAGNOSIS — K7460 Unspecified cirrhosis of liver: Secondary | ICD-10-CM | POA: Diagnosis not present

## 2023-07-03 DIAGNOSIS — R112 Nausea with vomiting, unspecified: Secondary | ICD-10-CM | POA: Diagnosis present

## 2023-07-03 DIAGNOSIS — K7581 Nonalcoholic steatohepatitis (NASH): Secondary | ICD-10-CM | POA: Diagnosis not present

## 2023-07-03 DIAGNOSIS — H353112 Nonexudative age-related macular degeneration, right eye, intermediate dry stage: Secondary | ICD-10-CM | POA: Diagnosis not present

## 2023-07-03 DIAGNOSIS — I272 Pulmonary hypertension, unspecified: Secondary | ICD-10-CM | POA: Diagnosis present

## 2023-07-03 DIAGNOSIS — D72819 Decreased white blood cell count, unspecified: Secondary | ICD-10-CM | POA: Diagnosis present

## 2023-07-03 DIAGNOSIS — I959 Hypotension, unspecified: Secondary | ICD-10-CM | POA: Diagnosis not present

## 2023-07-03 DIAGNOSIS — I700 Atherosclerosis of aorta: Secondary | ICD-10-CM | POA: Diagnosis not present

## 2023-07-03 DIAGNOSIS — I4821 Permanent atrial fibrillation: Secondary | ICD-10-CM

## 2023-07-03 DIAGNOSIS — I9589 Other hypotension: Secondary | ICD-10-CM | POA: Diagnosis present

## 2023-07-03 DIAGNOSIS — H2513 Age-related nuclear cataract, bilateral: Secondary | ICD-10-CM | POA: Diagnosis not present

## 2023-07-03 DIAGNOSIS — J811 Chronic pulmonary edema: Secondary | ICD-10-CM | POA: Diagnosis not present

## 2023-07-03 DIAGNOSIS — Z79899 Other long term (current) drug therapy: Secondary | ICD-10-CM

## 2023-07-03 DIAGNOSIS — E113293 Type 2 diabetes mellitus with mild nonproliferative diabetic retinopathy without macular edema, bilateral: Secondary | ICD-10-CM | POA: Diagnosis not present

## 2023-07-03 DIAGNOSIS — Z886 Allergy status to analgesic agent status: Secondary | ICD-10-CM

## 2023-07-03 DIAGNOSIS — I5033 Acute on chronic diastolic (congestive) heart failure: Secondary | ICD-10-CM | POA: Diagnosis present

## 2023-07-03 DIAGNOSIS — R531 Weakness: Principal | ICD-10-CM

## 2023-07-03 DIAGNOSIS — E119 Type 2 diabetes mellitus without complications: Secondary | ICD-10-CM

## 2023-07-03 DIAGNOSIS — E860 Dehydration: Secondary | ICD-10-CM | POA: Diagnosis present

## 2023-07-03 DIAGNOSIS — L03311 Cellulitis of abdominal wall: Principal | ICD-10-CM

## 2023-07-03 DIAGNOSIS — I5032 Chronic diastolic (congestive) heart failure: Secondary | ICD-10-CM | POA: Insufficient documentation

## 2023-07-03 DIAGNOSIS — D696 Thrombocytopenia, unspecified: Secondary | ICD-10-CM | POA: Diagnosis present

## 2023-07-03 DIAGNOSIS — M7989 Other specified soft tissue disorders: Secondary | ICD-10-CM | POA: Diagnosis not present

## 2023-07-03 DIAGNOSIS — H353221 Exudative age-related macular degeneration, left eye, with active choroidal neovascularization: Secondary | ICD-10-CM | POA: Diagnosis not present

## 2023-07-03 DIAGNOSIS — Z8249 Family history of ischemic heart disease and other diseases of the circulatory system: Secondary | ICD-10-CM

## 2023-07-03 DIAGNOSIS — Z9049 Acquired absence of other specified parts of digestive tract: Secondary | ICD-10-CM

## 2023-07-03 DIAGNOSIS — K3184 Gastroparesis: Secondary | ICD-10-CM | POA: Diagnosis present

## 2023-07-03 DIAGNOSIS — I8510 Secondary esophageal varices without bleeding: Secondary | ICD-10-CM | POA: Diagnosis present

## 2023-07-03 DIAGNOSIS — R54 Age-related physical debility: Secondary | ICD-10-CM | POA: Diagnosis present

## 2023-07-03 DIAGNOSIS — E876 Hypokalemia: Secondary | ICD-10-CM

## 2023-07-03 DIAGNOSIS — I130 Hypertensive heart and chronic kidney disease with heart failure and stage 1 through stage 4 chronic kidney disease, or unspecified chronic kidney disease: Secondary | ICD-10-CM | POA: Diagnosis present

## 2023-07-03 DIAGNOSIS — J9601 Acute respiratory failure with hypoxia: Secondary | ICD-10-CM | POA: Diagnosis present

## 2023-07-03 DIAGNOSIS — I4891 Unspecified atrial fibrillation: Secondary | ICD-10-CM | POA: Diagnosis not present

## 2023-07-03 DIAGNOSIS — I350 Nonrheumatic aortic (valve) stenosis: Secondary | ICD-10-CM

## 2023-07-03 DIAGNOSIS — M19042 Primary osteoarthritis, left hand: Secondary | ICD-10-CM | POA: Diagnosis not present

## 2023-07-03 DIAGNOSIS — I864 Gastric varices: Secondary | ICD-10-CM | POA: Diagnosis present

## 2023-07-03 DIAGNOSIS — K766 Portal hypertension: Secondary | ICD-10-CM | POA: Diagnosis present

## 2023-07-03 DIAGNOSIS — E1122 Type 2 diabetes mellitus with diabetic chronic kidney disease: Secondary | ICD-10-CM | POA: Diagnosis present

## 2023-07-03 DIAGNOSIS — I878 Other specified disorders of veins: Secondary | ICD-10-CM | POA: Diagnosis present

## 2023-07-03 DIAGNOSIS — K7469 Other cirrhosis of liver: Secondary | ICD-10-CM | POA: Diagnosis not present

## 2023-07-03 DIAGNOSIS — N1832 Chronic kidney disease, stage 3b: Secondary | ICD-10-CM | POA: Diagnosis present

## 2023-07-03 DIAGNOSIS — K661 Hemoperitoneum: Secondary | ICD-10-CM | POA: Diagnosis present

## 2023-07-03 DIAGNOSIS — Z87891 Personal history of nicotine dependence: Secondary | ICD-10-CM

## 2023-07-03 DIAGNOSIS — Z7985 Long-term (current) use of injectable non-insulin antidiabetic drugs: Secondary | ICD-10-CM

## 2023-07-03 DIAGNOSIS — Z6841 Body Mass Index (BMI) 40.0 and over, adult: Secondary | ICD-10-CM | POA: Diagnosis not present

## 2023-07-03 DIAGNOSIS — R9389 Abnormal findings on diagnostic imaging of other specified body structures: Secondary | ICD-10-CM | POA: Diagnosis not present

## 2023-07-03 DIAGNOSIS — R0902 Hypoxemia: Secondary | ICD-10-CM | POA: Diagnosis not present

## 2023-07-03 DIAGNOSIS — I082 Rheumatic disorders of both aortic and tricuspid valves: Secondary | ICD-10-CM | POA: Diagnosis present

## 2023-07-03 DIAGNOSIS — R161 Splenomegaly, not elsewhere classified: Secondary | ICD-10-CM | POA: Diagnosis not present

## 2023-07-03 DIAGNOSIS — H25043 Posterior subcapsular polar age-related cataract, bilateral: Secondary | ICD-10-CM | POA: Diagnosis not present

## 2023-07-03 DIAGNOSIS — I2489 Other forms of acute ischemic heart disease: Secondary | ICD-10-CM | POA: Diagnosis present

## 2023-07-03 DIAGNOSIS — R739 Hyperglycemia, unspecified: Secondary | ICD-10-CM | POA: Diagnosis not present

## 2023-07-03 DIAGNOSIS — M797 Fibromyalgia: Secondary | ICD-10-CM | POA: Diagnosis present

## 2023-07-03 DIAGNOSIS — D62 Acute posthemorrhagic anemia: Secondary | ICD-10-CM | POA: Diagnosis present

## 2023-07-03 DIAGNOSIS — D638 Anemia in other chronic diseases classified elsewhere: Secondary | ICD-10-CM | POA: Diagnosis present

## 2023-07-03 DIAGNOSIS — H2511 Age-related nuclear cataract, right eye: Secondary | ICD-10-CM | POA: Diagnosis not present

## 2023-07-03 DIAGNOSIS — Z83438 Family history of other disorder of lipoprotein metabolism and other lipidemia: Secondary | ICD-10-CM

## 2023-07-03 DIAGNOSIS — R0602 Shortness of breath: Secondary | ICD-10-CM | POA: Diagnosis not present

## 2023-07-03 DIAGNOSIS — N179 Acute kidney failure, unspecified: Secondary | ICD-10-CM | POA: Diagnosis present

## 2023-07-03 DIAGNOSIS — D684 Acquired coagulation factor deficiency: Secondary | ICD-10-CM | POA: Diagnosis present

## 2023-07-03 DIAGNOSIS — M793 Panniculitis, unspecified: Secondary | ICD-10-CM | POA: Diagnosis present

## 2023-07-03 DIAGNOSIS — H18413 Arcus senilis, bilateral: Secondary | ICD-10-CM | POA: Diagnosis not present

## 2023-07-03 DIAGNOSIS — N281 Cyst of kidney, acquired: Secondary | ICD-10-CM | POA: Diagnosis not present

## 2023-07-03 DIAGNOSIS — D649 Anemia, unspecified: Secondary | ICD-10-CM | POA: Insufficient documentation

## 2023-07-03 DIAGNOSIS — Z801 Family history of malignant neoplasm of trachea, bronchus and lung: Secondary | ICD-10-CM

## 2023-07-03 DIAGNOSIS — E1143 Type 2 diabetes mellitus with diabetic autonomic (poly)neuropathy: Secondary | ICD-10-CM | POA: Diagnosis present

## 2023-07-03 DIAGNOSIS — R079 Chest pain, unspecified: Secondary | ICD-10-CM | POA: Diagnosis not present

## 2023-07-03 DIAGNOSIS — Z452 Encounter for adjustment and management of vascular access device: Secondary | ICD-10-CM | POA: Diagnosis not present

## 2023-07-03 DIAGNOSIS — I517 Cardiomegaly: Secondary | ICD-10-CM | POA: Diagnosis not present

## 2023-07-03 DIAGNOSIS — R0989 Other specified symptoms and signs involving the circulatory and respiratory systems: Secondary | ICD-10-CM | POA: Diagnosis not present

## 2023-07-03 DIAGNOSIS — D539 Nutritional anemia, unspecified: Secondary | ICD-10-CM | POA: Diagnosis present

## 2023-07-03 DIAGNOSIS — Z885 Allergy status to narcotic agent status: Secondary | ICD-10-CM

## 2023-07-03 DIAGNOSIS — G4733 Obstructive sleep apnea (adult) (pediatric): Secondary | ICD-10-CM

## 2023-07-03 DIAGNOSIS — Z91013 Allergy to seafood: Secondary | ICD-10-CM

## 2023-07-03 DIAGNOSIS — E785 Hyperlipidemia, unspecified: Secondary | ICD-10-CM | POA: Diagnosis present

## 2023-07-03 DIAGNOSIS — R918 Other nonspecific abnormal finding of lung field: Secondary | ICD-10-CM | POA: Diagnosis not present

## 2023-07-03 DIAGNOSIS — D6959 Other secondary thrombocytopenia: Secondary | ICD-10-CM | POA: Diagnosis present

## 2023-07-03 DIAGNOSIS — Z888 Allergy status to other drugs, medicaments and biological substances status: Secondary | ICD-10-CM

## 2023-07-03 DIAGNOSIS — I5031 Acute diastolic (congestive) heart failure: Secondary | ICD-10-CM | POA: Diagnosis not present

## 2023-07-03 DIAGNOSIS — Z823 Family history of stroke: Secondary | ICD-10-CM

## 2023-07-03 LAB — GLUCOSE, CAPILLARY
Glucose-Capillary: 176 mg/dL — ABNORMAL HIGH (ref 70–99)
Glucose-Capillary: 214 mg/dL — ABNORMAL HIGH (ref 70–99)
Glucose-Capillary: 181 mg/dL — ABNORMAL HIGH (ref 70–99)
Glucose-Capillary: 183 mg/dL — ABNORMAL HIGH (ref 70–99)
Glucose-Capillary: 130 mg/dL — ABNORMAL HIGH (ref 70–99)
Glucose-Capillary: 153 mg/dL — ABNORMAL HIGH (ref 70–99)

## 2023-07-03 LAB — CBC
HCT: 21.9 % — ABNORMAL LOW (ref 36.0–46.0)
Hemoglobin: 7.3 g/dL — ABNORMAL LOW (ref 12.0–15.0)
MCH: 33.6 pg (ref 26.0–34.0)
MCHC: 33.3 g/dL (ref 30.0–36.0)
MCV: 100.9 fL — ABNORMAL HIGH (ref 80.0–100.0)
Platelets: 46 10*3/uL — ABNORMAL LOW (ref 150–400)
RBC: 2.17 MIL/uL — ABNORMAL LOW (ref 3.87–5.11)
RDW: 15.6 % — ABNORMAL HIGH (ref 11.5–15.5)
WBC: 6.6 10*3/uL (ref 4.0–10.5)
nRBC: 1.5 % — ABNORMAL HIGH (ref 0.0–0.2)

## 2023-07-03 LAB — BASIC METABOLIC PANEL
Anion gap: 12 (ref 5–15)
BUN: 32 mg/dL — ABNORMAL HIGH (ref 8–23)
CO2: 21 mmol/L — ABNORMAL LOW (ref 22–32)
Calcium: 8.4 mg/dL — ABNORMAL LOW (ref 8.9–10.3)
Chloride: 103 mmol/L (ref 98–111)
Creatinine, Ser: 1.87 mg/dL — ABNORMAL HIGH (ref 0.44–1.00)
GFR, Estimated: 29 mL/min — ABNORMAL LOW (ref >60)
Glucose, Bld: 205 mg/dL — ABNORMAL HIGH (ref 70–99)
Potassium: 3.6 mmol/L (ref 3.5–5.1)
Sodium: 136 mmol/L (ref 135–145)

## 2023-07-03 LAB — TROPONIN I (HIGH SENSITIVITY)
Troponin I (High Sensitivity): 41 ng/L — ABNORMAL HIGH (ref <18)
Troponin I (High Sensitivity): 50 ng/L — ABNORMAL HIGH (ref <18)

## 2023-07-03 LAB — HEPATIC FUNCTION PANEL
ALT: 59 U/L — ABNORMAL HIGH (ref 0–44)
AST: 105 U/L — ABNORMAL HIGH (ref 15–41)
Albumin: 2.6 g/dL — ABNORMAL LOW (ref 3.5–5.0)
Alkaline Phosphatase: 127 U/L — ABNORMAL HIGH (ref 38–126)
Bilirubin, Direct: 1.4 mg/dL — ABNORMAL HIGH (ref 0.0–0.2)
Indirect Bilirubin: 3.8 mg/dL — ABNORMAL HIGH (ref 0.3–0.9)
Total Bilirubin: 5.2 mg/dL — ABNORMAL HIGH (ref 0.3–1.2)
Total Protein: 5.8 g/dL — ABNORMAL LOW (ref 6.5–8.1)

## 2023-07-03 LAB — I-STAT CG4 LACTIC ACID, ED: Lactic Acid, Venous: 1.6 mmol/L (ref 0.5–1.9)

## 2023-07-03 LAB — PROTIME-INR
INR: 1.3 — ABNORMAL HIGH (ref 0.8–1.2)
Prothrombin Time: 16.5 s — ABNORMAL HIGH (ref 11.4–15.2)

## 2023-07-03 LAB — LIPASE, BLOOD: Lipase: 33 U/L (ref 11–51)

## 2023-07-03 MED ORDER — SODIUM CHLORIDE 0.9 % IV SOLN
500.0000 mL | Freq: Once | INTRAVENOUS | Status: AC
  Administered 2023-07-03: 500 mL via INTRAVENOUS

## 2023-07-03 MED ORDER — OXYCODONE HCL 5 MG PO TABS
5.0000 mg | ORAL_TABLET | Freq: Once | ORAL | Status: AC
  Administered 2023-07-03: 5 mg via ORAL
  Filled 2023-07-03: qty 1

## 2023-07-03 MED ORDER — SODIUM CHLORIDE 0.9 % IV BOLUS
500.0000 mL | Freq: Once | INTRAVENOUS | Status: AC
  Administered 2023-07-03: 500 mL via INTRAVENOUS

## 2023-07-03 MED ORDER — PANTOPRAZOLE SODIUM 40 MG IV SOLR
40.0000 mg | Freq: Once | INTRAVENOUS | Status: AC
  Administered 2023-07-03: 40 mg via INTRAVENOUS
  Filled 2023-07-03: qty 10

## 2023-07-03 MED ORDER — SODIUM CHLORIDE 0.9 % IV SOLN
1.0000 g | INTRAVENOUS | Status: DC
  Administered 2023-07-03: 1 g via INTRAVENOUS
  Filled 2023-07-03: qty 10

## 2023-07-03 MED ORDER — ONDANSETRON HCL 4 MG/2ML IJ SOLN
4.0000 mg | Freq: Once | INTRAMUSCULAR | Status: AC
  Administered 2023-07-03: 4 mg via INTRAVENOUS
  Filled 2023-07-03: qty 2

## 2023-07-03 NOTE — ED Triage Notes (Addendum)
Patient BIB EMS from home, Patient had stent in liver placed Friday. Generalized weakness with hypotension. 80/50, IVF came up 99/60. 20gLhand

## 2023-07-03 NOTE — ED Provider Triage Note (Signed)
Emergency Medicine Provider Triage Evaluation Note  Hannah Khan , a 67 y.o. female  was evaluated in triage.  Pt complains of pain, weakness.  Patient had TIPS procedure done this past week, discharged yesterday.  Review of Systems  Positive: Weakness Negative:   Physical Exam  BP (!) 110/47   Pulse 97   Temp 98.3 F (36.8 C) (Oral)   Resp 20   SpO2 97%  Gen:   Awake, no distress   Resp:  Normal effort  MSK:   Moves extremities without difficulty  Other:    Medical Decision Making  Medically screening exam initiated at 1:44 PM.  Appropriate orders placed.  Hannah Khan was informed that the remainder of the evaluation will be completed by another provider, this initial triage assessment does not replace that evaluation, and the importance of remaining in the ED until their evaluation is complete.  Six 67-year-old female here today with weakness following TIPS procedure.  Routine labs ordered.  Patient with intermittent soft blood pressures in triage, received 500 cc of fluid.   Anders Simmonds T, DO 07/03/23 1346

## 2023-07-03 NOTE — ED Notes (Signed)
Placed pt on oxygen 2 LPM via [place], pt co SHOB, sats dropped to 85 while talking during [facility]. Dr Jeraldine Loots has been made aware.

## 2023-07-03 NOTE — H&P (Signed)
History and Physical    ZOA FONDREN ZOX:[id number] DOB: [birth year] DOA: 07/03/2023  PCP: Darrow Bussing, MD  Patient coming from: {Blank single:19197::"Home","SNF","ALF/ILF","Group Home","BHH","CIR","Hospice","Homeless","[facility] ED","[facility] ED","Outside Hospital","***"}  Chief Complaint: ***  HPI: Hannah Khan is a 67 y.o. female with medical history significant of A-fib not on anticoagulation, CHF, hypertension, type 2 diabetes, CKD stage IIIa, Nash cirrhosis, gastroesophageal varices, chronic thrombocytopenia, anemia, abdominal wall cellulitis, severe morbid obesity. Recently admitted TIPS creation on 06/30/2023 which was unsuccessful but they were able to perform balloon occlusion retrograde transvenous obliteration of the gastric varix. Postprocedure she developed hypotension requiring pressor support. Also developed A-fib with RVR treated with oral doses of metoprolol and Cardizem. Hospital course further complicated by acute blood loss anemia due to small to moderate volume hemoperitoneum. She was discharged from the hospital yesterday.  Patient presents to the ED this evening for evaluation of nausea, vomiting, and hypotension.  Blood pressure 80/50 on arrival.  In A-fib with heart rate in the 110s to 130s.  Afebrile.  Labs notable for WBC 6.6, hemoglobin 7.3 (was 7.0 on discharge yesterday), platelet count 46k (was 51k yesterday), sodium 136, potassium 3.6, chloride 103, bicarb 21, BUN 32, creatinine 1.8 (was 1.9 yesterday), glucose 205, calcium 8.4, albumin 2.6, AST 105, ALT 59, alk phos 127, T. bili 5.2 (LFTs overall stable since discharge), INR 1.3, lipase 33, lactate 1.6, troponin 41> 50.  EKG showing A-fib and no acute ischemic changes. CT abdomen pelvis showing decrease in size of hemoperitoneum, stable L>R pleural effusions, and no acute findings. EDP discussed the case with Dr. Fredia Sorrow, IR team will see the patient in consultation.  Requested admission by medicine service.  Patient was  given Zofran, Oxy IR 5 mg, IV Protonix 40 mg, and 500 mL normal saline.  She was also given ceftriaxone for possible abdominal wall cellulitis/panniculitis.  Blood pressure improved after fluid bolus.  However, patient complained of shortness of breath and desatted to 85% on room air, placed on 2 L [place].  Chest x-ray pending.***  Patient is upset that she has continued to vomit since after her recent TIPS procedure but was still discharged from the hospital yesterday.  She was not able to tolerate any p.o. intake at home.  She is having pain across her upper abdomen since after the procedure but thinks the pain is stable and not any worse.  Denies fevers.  She is endorsing shortness of breath and requesting her home CPAP for sleep apnea.  Denies chest pain.  Review of Systems:  Review of Systems  All other systems reviewed and are negative.   Past Medical History:  Diagnosis Date   Anemia    hx of   Aortic stenosis 02/2023   Echo 03/07/23 LVEF 55-60%, mild to moderate AS   Arthritis    knees   CHF (congestive heart failure) (HCC)    chronic diastolic   Diabetes mellitus without complication (HCC)    type 2   Dyspnea    Dysrhythmia    Fibromyalgia    Gastric varices    large   H/O transfusion of platelets    Heart murmur    never has caused any problems   Hx of colonic polyps    s/p partial colectomy   Hyperlipidemia    Hypertension    Joint pain    in knees and back spasms   Left leg swelling    wear compression hose   Liver cirrhosis secondary to NASH (nonalcoholic steatohepatitis) (HCC) dx nov 2014  had enlarged spleen also    Morbid obesity (HCC)    Persistent atrial fibrillation (HCC)    Pneumonia 10/2016   x 5   PONV (postoperative nausea and vomiting)    in past none recent   Portal hypertension (HCC)    Sleep apnea    uses cpap, setting varies between 14-20   Spleen enlarged    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 [facility])     Past Surgical History:  Procedure  Laterality Date   BREAST LUMPECTOMY WITH RADIOACTIVE SEED LOCALIZATION Right 08/29/2017   Procedure: RIGHT BREAST LUMPECTOMY WITH RADIOACTIVE SEEDS LOCALIZATION;  Surgeon: Harriette Bouillon, MD;  Location: [facility] OR;  Service: General;  Laterality: Right;   CESAREAN SECTION  1989   CHOLECYSTECTOMY  20 yrs ago   COLECTOMY  2011   COLONOSCOPY     COLONOSCOPY N/A 09/05/2018   Procedure: COLONOSCOPY;  Surgeon: Willis Modena, MD;  Location: [facility];  Service: Endoscopy;  Laterality: N/A;   COLONOSCOPY WITH PROPOFOL Bilateral 05/04/2022   Procedure: COLONOSCOPY WITH PROPOFOL;  Surgeon: Willis Modena, MD;  Location: [facility];  Service: Gastroenterology;  Laterality: Bilateral;   DILATATION & CURETTAGE/HYSTEROSCOPY WITH MYOSURE N/A 05/06/2016   Procedure: DILATATION & CURETTAGE/HYSTEROSCOPY WITH MYOSURE WITH POLYPECTOMY;  Surgeon: Myna Hidalgo, DO;  Location: [facility];  Service: Gynecology;  Laterality: N/A;   ESOPHAGOGASTRODUODENOSCOPY (EGD) WITH PROPOFOL N/A 09/04/2013   Procedure: ESOPHAGOGASTRODUODENOSCOPY (EGD) WITH PROPOFOL;  Surgeon: Willis Modena, MD;  Location: [facility];  Service: Endoscopy;  Laterality: N/A;   ESOPHAGOGASTRODUODENOSCOPY (EGD) WITH PROPOFOL Bilateral 05/04/2022   Procedure: ESOPHAGOGASTRODUODENOSCOPY (EGD) WITH PROPOFOL;  Surgeon: Willis Modena, MD;  Location: [facility];  Service: Gastroenterology;  Laterality: Bilateral;   IR EMBO ART  VEN HEMORR LYMPH EXTRAV  INC GUIDE ROADMAPPING  06/30/2023   IR INTRAVASCULAR ULTRASOUND NON CORONARY  12/23/2022   IR RADIOLOGIST EVAL & MGMT  09/21/2022   IR RADIOLOGIST EVAL & MGMT  10/06/2022   IR RADIOLOGIST EVAL & MGMT  11/02/2022   IR RADIOLOGIST EVAL & MGMT  01/31/2023   IR RADIOLOGIST EVAL & MGMT  05/31/2023   IR TIPS  12/23/2022   IR TIPS  06/30/2023   IR US GUIDE VASC ACCESS LEFT  06/30/2023   IR US GUIDE VASC ACCESS RIGHT  12/23/2022   IR US GUIDE VASC ACCESS RIGHT  06/30/2023   IR US GUIDE VASC ACCESS RIGHT  06/30/2023    KNEE ARTHROSCOPY  yrs ago   bilateral, one done x 1, one done twice   POLYPECTOMY  09/05/2018   Procedure: POLYPECTOMY;  Surgeon: Willis Modena, MD;  Location: [facility];  Service: Endoscopy;;   RADIOLOGY WITH ANESTHESIA N/A 12/23/2022   Procedure: TIPS;  Surgeon: Bennie Dallas, MD;  Location: [facility];  Service: Radiology;  Laterality: N/A;   RIGHT HEART CATH N/A 05/19/2023   Procedure: RIGHT HEART CATH;  Surgeon: Dorthula Nettles, DO;  Location: [facility];  Service: Cardiovascular;  Laterality: N/A;   TIPS PROCEDURE N/A 06/30/2023   Procedure: TRANS-JUGULAR INTRAHEPATIC PORTAL SHUNT (TIPS);  Surgeon: Bennie Dallas, MD;  Location[facility];  Service: Radiology;  Laterality: N/A;     reports that she quit smoking about 48 years ago. Her smoking use included cigarettes. She started smoking about 51 years ago. She has a 3 pack-year smoking history. She has never used smokeless tobacco. She reports that she does not currently use alcohol. She reports that she does not use drugs.  Allergies  Allergen Reactions   Celebrex [Celecoxib] Other (  See Comments)    "speech slurred" with celebrex   Shellfish Allergy Swelling and Rash    TINGLING AND SWELLING OF LIPS. LARGE WHELPS QUARTER-SIZE   Barium-Containing Compounds Other (See Comments)    TACHYCARDIA   Prednisone Other (See Comments)    TACHYCARDIA   Statins Other (See Comments)    AGGRAVATED FIBROMYALGIA   Fentanyl     Hallucinations    Ibuprofen Other (See Comments)    UNSPECIFIED SPECIFIC REACTION >> "DUE TO PLATELETS"   Sglt2 Inhibitors     Ozempic Severe gastroparesis    Tramadol     Hallucination    Tylenol [Acetaminophen] Other (See Comments)    UNSPECIFIED SPECIFIC REACTION >> "DUE TO PLATELETS"   Zetia [Ezetimibe]     Headache    Family History  Problem Relation Age of Onset   Heart failure Mother    Cancer Mother    Hyperlipidemia Mother    Congestive Heart Failure Mother    Stroke Mother    Lung  cancer Father    Cancer Father    Cancer Brother    Hyperlipidemia Brother    Stroke Other    Breast cancer Neg Hx     Prior to Admission medications   Medication Sig Start Date End Date Taking? Authorizing Provider  albuterol (VENTOLIN HFA) 108 (90 Base) MCG/ACT inhaler Inhale 1-2 puffs into the lungs every 6 (six) hours as needed for wheezing or shortness of breath.   Yes [provider]  digoxin (LANOXIN) 0.125 MG tablet Take 1 tablet (0.125 mg total) by mouth daily. 04/28/23  Yes Sabharwal, Aditya, DO  diltiazem (CARDIZEM CD) 180 MG 24 hr capsule Take 1 capsule (180 mg total) by mouth daily. 04/18/23  Yes Alver Sorrow, NP  lactulose (CHRONULAC) 10 GM/15ML solution Take 20 g by mouth 3 (three) times daily. 03/17/21  Yes [provider]  Lidocaine HCl-Benzyl Alcohol (SALONPAS LIDOCAINE PLUS) 4-10 % CREA Apply 1 Application topically daily as needed (knee pain).   Yes [provider]  metolazone (ZAROXOLYN) 5 MG tablet Take 1 tablet (5 mg total) by mouth once a week. Take one tablet on Sundays 30 minutes before Bumex 02/28/23  Yes Alver Sorrow, NP  metoprolol tartrate (LOPRESSOR) 25 MG tablet Take 0.5 tablets (12.5 mg total) by mouth 2 (two) times daily. 05/08/23  Yes Milford, Anderson Malta, FNP  midodrine (PROAMATINE) 10 MG tablet Take 1 tablet (10 mg total) by mouth 3 (three) times daily. 04/18/23  Yes Alver Sorrow, NP  Multiple Vitamins-Minerals (PRESERVISION AREDS 2) CAPS Take 1 capsule by mouth 2 (two) times daily.   Yes [provider]  ondansetron (ZOFRAN) 4 MG tablet Take 1 tablet (4 mg total) by mouth every 8 (eight) hours as needed for up to 7 days for nausea or vomiting. 07/02/23 07/09/23 Yes Covington, Arman Filter, NP  oxycodone (OXY-IR) 5 MG capsule Take 1 capsule (5 mg total) by mouth every 4 (four) hours as needed for up to 5 days for pain. 07/02/23 07/07/23 Yes Covington, Arman Filter, NP  potassium chloride (MICRO-K) 10 MEQ CR capsule Take 10  capsules (100 mEq total) by mouth 2 (two) times daily. Take an extra 4 tablets when taking Metolazone. 06/15/23  Yes Andrey Farmer, PA-C  Propylene Glycol (SYSTANE BALANCE) 0.6 % SOLN Place 1 drop into both eyes 2 (two) times daily.   Yes [provider]  torsemide (DEMADEX) 20 MG tablet Take 4 tablets (80 mg total) by mouth every morning AND  3 tablets (60 mg total) every evening. 06/01/23  Yes Alen Bleacher, NP  XIFAXAN 550 MG TABS tablet Take 550 mg by mouth 2 (two) times daily. 03/13/21  Yes [provider]  doxycycline (ADOXA) 100 MG tablet Take 100 mg by mouth 2 (two) times daily. Per patient last dose will be Friday 06-30-23 Patient not taking: Reported on 07/03/2023    [provider]  Lancets [facility] DELICA PLUS [place]) MISC Apply topically 3 (three) times daily. 04/10/20   [provider]  [facility] VERIO test strip 1 each 3 (three) times daily. 04/13/20   [provider]    Physical Exam: Vitals:   07/03/23 2030 07/03/23 2100 07/03/23 2130 07/03/23 2200  BP:      Pulse: (!) 121 84 (!) 122 (!) 129  Resp: 17 (!) 21 17 (!) 23  Temp:      TempSrc:      SpO2: 96% 99% (!) 89% 98%    Physical Exam Vitals reviewed.  Constitutional:      General: She is not in acute distress. HENT:     Head: Normocephalic and atraumatic.  Eyes:     Extraocular Movements: Extraocular movements intact.  Cardiovascular:     Rate and Rhythm: Tachycardia present. Rhythm irregular.     Pulses: Normal pulses.  Pulmonary:     Effort: Pulmonary effort is normal. No respiratory distress.     Breath sounds: Rales present. No wheezing.  Abdominal:     General: Bowel sounds are normal. There is no distension.     Palpations: Abdomen is soft.     Tenderness: There is no abdominal tenderness.  Musculoskeletal:     Cervical back: Normal range of motion.     Right lower leg: Edema present.     Left lower leg: Edema present.     Comments: 3+ pitting edema  of bilateral lower extremities  Skin:    General: Skin is warm and dry.  Neurological:     General: No focal deficit present.     Mental Status: She is alert and oriented to person, place, and time.     Labs on Admission: I have personally reviewed following labs and imaging studies  CBC: Recent Labs  Lab 06/30/23 1525 07/01/23 0300 07/01/23 1653 07/02/23 0945 07/03/23 1307  WBC 8.4 8.6 10.3 6.6 6.6  HGB 8.0* 7.7* 7.8* 7.0* 7.3*  HCT 24.4* 23.1* 23.3* 20.8* 21.9*  MCV 99.2 99.1 100.4* 98.6 100.9*  PLT 66* 64* 67* 51* 46*   Basic Metabolic Panel: Recent Labs  Lab 06/30/23 0549 06/30/23 1015 07/01/23 0300 07/01/23 1653 07/02/23 0945 07/03/23 1307  NA 140 140 137 138 134* 136  K 3.0* 3.1* 3.6 3.5 3.5 3.6  CL 102  --  105 103 102 103  CO2 23  --  23 24 21* 21*  GLUCOSE 137*  --  214* 186* 201* 205*  BUN 15  --  24* 29* 31* 32*  CREATININE 1.39*  --  1.97* 2.25* 1.97* 1.87*  CALCIUM 9.0  --  8.6* 8.9 8.6* 8.4*   GFR: Estimated Creatinine Clearance: 44.7 mL/min (A) (by C-G formula based on SCr of 1.87 mg/dL (H)). Liver Function Tests: Recent Labs  Lab 06/30/23 0549 07/01/23 0300 07/01/23 1653 07/02/23 0945 07/03/23 1500  AST 55* 100* 183* 140* 105*  ALT 32 38 62* 65* 59*  ALKPHOS 95 73 105 109 127*  BILITOT 3.9* 3.0* 7.7* 5.1* 5.2*  PROT 6.6 5.4* 5.8* 5.6* 5.8*  ALBUMIN  2.6* 2.4* 2.7* 2.5* 2.6*   Recent Labs  Lab 07/03/23 1500  LIPASE 33   No results for input(s): "AMMONIA" in the last 168 hours. Coagulation Profile: Recent Labs  Lab 06/30/23 0549 07/02/23 0945 07/03/23 1500  INR 1.2 1.4* 1.3*   Cardiac Enzymes: No results for input(s): "CKTOTAL", "CKMB", "CKMBINDEX", "TROPONINI" in the last 168 hours. BNP (last 3 results) No results for input(s): "PROBNP" in the last 8760 hours. HbA1C: No results for input(s): "HGBA1C" in the last 72 hours. CBG: Recent Labs  Lab 07/01/23 1136 07/01/23 1557 07/01/23 2121 07/02/23 0623 07/02/23 1138   GLUCAP 214* 181* 183* 130* 153*   Lipid Profile: No results for input(s): "CHOL", "HDL", "LDLCALC", "TRIG", "CHOLHDL", "LDLDIRECT" in the last 72 hours. Thyroid Function Tests: No results for input(s): "TSH", "T4TOTAL", "FREET4", "T3FREE", "THYROIDAB" in the last 72 hours. Anemia Panel: No results for input(s): "VITAMINB12", "FOLATE", "FERRITIN", "TIBC", "IRON", "RETICCTPCT" in the last 72 hours. Urine analysis:    Component Value Date/Time   COLORURINE YELLOW 04/06/2023 1047   APPEARANCEUR CLEAR 04/06/2023 1047   LABSPEC 1.034 (H) 04/06/2023 1047   PHURINE 5.0 04/06/2023 1047   GLUCOSEU NEGATIVE 04/06/2023 1047   HGBUR NEGATIVE 04/06/2023 1047   BILIRUBINUR NEGATIVE 04/06/2023 1047   KETONESUR NEGATIVE 04/06/2023 1047   PROTEINUR NEGATIVE 04/06/2023 1047   UROBILINOGEN 2.0 (H) 01/31/2015 0055   NITRITE NEGATIVE 04/06/2023 1047   LEUKOCYTESUR NEGATIVE 04/06/2023 1047    Radiological Exams on Admission: CT ABDOMEN PELVIS WO CONTRAST  Result Date: 07/03/2023 CLINICAL DATA:  Abdominal pain. Generalized weakness and hypotension. Hemoperitoneum EXAM: CT ABDOMEN AND PELVIS WITHOUT CONTRAST TECHNIQUE: Multidetector CT imaging of the abdomen and pelvis was performed following the standard protocol without IV contrast. RADIATION DOSE REDUCTION: This exam was performed according to the departmental dose-optimization program which includes automated exposure control, adjustment of the mA and/or kV according to patient size and/or use of iterative reconstruction technique. COMPARISON:  CT abdomen and pelvis without contrast 07/01/23 FINDINGS: Lower chest: Left greater than right pleural effusions are stable. Mild atelectasis is present at the left base. The heart is enlarged. Blood chambers are hypodense, consistent with anemia. Hepatobiliary: Nodular irregularity is present within the liver. No discrete lesions are present. Hemoperitoneum along the anterior margin of the liver is decreased in  size compared to the prior exam. Cholecystectomy noted. No significant intra or extrahepatic biliary dilation is present. Pancreas: Unremarkable. No pancreatic ductal dilatation or surrounding inflammatory changes. Spleen: Splenomegaly again noted. Adrenals/Urinary Tract: The adrenal glands are normal bilaterally. Bilateral simple cysts are stable. The ureters are within normal limits. The urinary bladder is mostly collapsed. Stomach/Bowel: The stomach and duodenum are within normal limits. Small bowel is unremarkable. Terminal ileum is normal. Appendectomy noted. Ascending and transverse colon within normal limits. Descending and sigmoid colon are normal. Vascular/Lymphatic: Scattered atherosclerotic calcifications are present within the aorta and branch vessels. No aneurysm is present. Cavernous transformation of the portal vein is again noted. Aneurysm coils are present. No significant adenopathy is present. Reproductive: IUD remains. Uterus and adnexa are otherwise within normal limits. Other: Hemoperitoneum extending into the pelvis is decreased compared to the prior exam. No new hemorrhage is present. Diffuse subcutaneous edema is present. No free air is present. Musculoskeletal: Mild degenerative changes are again noted in the thoracolumbar spine. No focal osseous lesions are present. Bony pelvis is normal. The hips are located and within normal limits bilaterally. IMPRESSION: 1. Decreased hemoperitoneum compared to the prior exam. 2. Stable left greater than right  pleural effusions. 3. Stable cardiomegaly. 4. Anemia. 5. Stable splenomegaly. 6. Stable simple cysts of both kidneys. 7. Cavernous transformation of the portal vein. 8.  Aortic Atherosclerosis (ICD10-I70.0). Electronically Signed   By: Marin Roberts M.D.   On: 07/03/2023 21:59    EKG: Independently reviewed. ***  Assessment and Plan  DVT prophylaxis: {Blank single:19197::"Lovenox","SQ Heparin","IV heparin  gtt","Xarelto","Eliquis","Coumadin","SCDs","***"} Code Status: {Blank single:19197::"Full Code","DNR","DNR/DNI","Comfort Care","***"} Family Communication: ***  Consults called: ***  Level of care: {Blank single:19197::"Med-Surg","Telemetry bed","Progressive Care Unit","Step Down Unit"} Admission status: *** Time Spent: 75+ minutes***  John Giovanni MD [facility]  If 7PM-7AM, please contact night-coverage [email or web address]  07/03/2023, 11:26 PM

## 2023-07-03 NOTE — ED Provider Notes (Signed)
[place] EMERGENCY DEPARTMENT AT [facility] Provider Note   CSN: [id number] Arrival date & time: 07/03/23  1254     History  Chief Complaint  Patient presents with   Hypotension    Hannah Khan is a 67 y.o. female.  Patient is a 67 year old female with a past medical history of cirrhosis status post TIPS procedure 4 days ago and A-fib not on anticoagulation presenting to the emergency department with nausea, vomiting and hypotension.  The patient states that her TIPS procedure was only partially successful and was complicated by intraperitoneal hematoma and anemia.  She states that she did not require blood transfusion.  She states that she has had epigastric pain since the procedure and has been nauseous and vomiting since her hospitalization.  She states that she was still nauseous and vomiting when she was discharged yesterday and states that she has continued to vomit at home.  She states that she checked her blood pressure this morning and it was in the 60s systolic.  She states that it does run low and she takes midodrine at baseline.  She states that her pain has not worsened.  She states that she had diarrhea in the hospital that is now resolved, still passing gas.  She states that most of her pain is in her arms where her IV sites were when she was admitted to the hospital.  She denies any fevers or chest pain.   The history is provided by the patient.       Home Medications Prior to Admission medications   Medication Sig Start Date End Date Taking? Authorizing Provider  albuterol (VENTOLIN HFA) 108 (90 Base) MCG/ACT inhaler Inhale 1-2 puffs into the lungs every 6 (six) hours as needed for wheezing or shortness of breath.    [provider]  digoxin (LANOXIN) 0.125 MG tablet Take 1 tablet (0.125 mg total) by mouth daily. 04/28/23   Sabharwal, Aditya, DO  diltiazem (CARDIZEM CD) 180 MG 24 hr capsule Take 1 capsule (180 mg total) by mouth daily. 04/18/23    Alver Sorrow, NP  doxycycline (ADOXA) 100 MG tablet Take 100 mg by mouth 2 (two) times daily. Per patient last dose will be Friday 06-30-23    [provider]  lactulose (CHRONULAC) 10 GM/15ML solution Take 20 g by mouth 3 (three) times daily. 03/17/21   [provider]  Lancets (ONETOUCH DELICA PLUS [place]) MISC Apply topically 3 (three) times daily. 04/10/20   [provider]  Lidocaine HCl-Benzyl Alcohol (SALONPAS LIDOCAINE PLUS) 4-10 % CREA Apply 1 Application topically daily as needed (knee pain).    [provider]  metolazone (ZAROXOLYN) 5 MG tablet Take 1 tablet (5 mg total) by mouth once a week. Take one tablet on Sundays 30 minutes before Bumex 02/28/23   Alver Sorrow, NP  metoprolol tartrate (LOPRESSOR) 25 MG tablet Take 0.5 tablets (12.5 mg total) by mouth 2 (two) times daily. 05/08/23   Milford, Anderson Malta, FNP  midodrine (PROAMATINE) 10 MG tablet Take 1 tablet (10 mg total) by mouth 3 (three) times daily. 04/18/23   Alver Sorrow, NP  Multiple Vitamins-Minerals (PRESERVISION AREDS 2) CAPS Take 1 capsule by mouth 2 (two) times daily.    [provider]  ondansetron (ZOFRAN) 4 MG tablet Take 1 tablet (4 mg total) by mouth every 8 (eight) hours as needed for up to 7 days for nausea or vomiting. 07/02/23 07/09/23  Mickie Kay, NP  ONETOUCH VERIO test  strip 1 each 3 (three) times daily. 04/13/20   [provider]  oxycodone (OXY-IR) 5 MG capsule Take 1 capsule (5 mg total) by mouth every 4 (four) hours as needed for up to 5 days for pain. 07/02/23 07/07/23  Mickie Kay, NP  potassium chloride (MICRO-K) 10 MEQ CR capsule Take 10 capsules (100 mEq total) by mouth 2 (two) times daily. Take an extra 4 tablets when taking Metolazone. 06/15/23   Andrey Farmer, PA-C  Propylene Glycol (SYSTANE BALANCE) 0.6 % SOLN Place 1 drop into both eyes 2 (two) times daily.    [provider]  torsemide (DEMADEX) 20 MG  tablet Take 4 tablets (80 mg total) by mouth every morning AND 3 tablets (60 mg total) every evening. 06/01/23   Alen Bleacher, NP  XIFAXAN 550 MG TABS tablet Take 550 mg by mouth 2 (two) times daily. 03/13/21   [provider]      Allergies    Celebrex [celecoxib], Shellfish allergy, Barium-containing compounds, Prednisone, Statins, Fentanyl, Ibuprofen, Sglt2 inhibitors, Tramadol, Tylenol [acetaminophen], and Zetia [ezetimibe]    Review of Systems   Review of Systems  Physical Exam Updated Vital Signs BP (!) 92/43   Pulse (!) 104   Temp 98.9 F (37.2 C) (Oral)   Resp 19   SpO2 94%  Physical Exam Vitals and nursing note reviewed.  Constitutional:      General: She is not in acute distress.    Appearance: Normal appearance. She is obese.  HENT:     Head: Normocephalic and atraumatic.     Nose: Nose normal.     Mouth/Throat:     Mouth: Mucous membranes are moist.     Pharynx: Oropharynx is clear.  Eyes:     Extraocular Movements: Extraocular movements intact.     Conjunctiva/sclera: Conjunctivae normal.  Cardiovascular:     Rate and Rhythm: Normal rate and regular rhythm.     Heart sounds: Normal heart sounds.  Pulmonary:     Effort: Pulmonary effort is normal.     Breath sounds: Normal breath sounds.  Abdominal:     General: Abdomen is flat.     Palpations: Abdomen is soft.     Tenderness: There is abdominal tenderness (minimal epigastric). There is no guarding or rebound.  Musculoskeletal:        General: Normal range of motion.     Cervical back: Normal range of motion.     Right lower leg: Edema (1+) present.     Left lower leg: Edema (1+) present.  Skin:    General: Skin is warm and dry.     Comments: Dressing from previous PICC line and neck surgical slight clean and dry, bruising to skin of prior IV site but no palpable hematoma, surrounding erythema or warmth  Neurological:     General: No focal deficit present.     Mental Status: She is alert and  oriented to person, place, and time.  Psychiatric:        Mood and Affect: Mood normal.        Behavior: Behavior normal.     ED Results / Procedures / Treatments   Labs (all labs ordered are listed, but only abnormal results are displayed) Labs Reviewed  CBC - Abnormal; Notable for the following components:      Result Value   RBC 2.17 (*)    Hemoglobin 7.3 (*)    HCT 21.9 (*)    MCV 100.9 (*)  RDW 15.6 (*)    Platelets 46 (*)    nRBC 1.5 (*)    All other components within normal limits  BASIC METABOLIC PANEL - Abnormal; Notable for the following components:   CO2 21 (*)    Glucose, Bld 205 (*)    BUN 32 (*)    Creatinine, Ser 1.87 (*)    Calcium 8.4 (*)    GFR, Estimated 29 (*)    All other components within normal limits  PROTIME-INR - Abnormal; Notable for the following components:   Prothrombin Time 16.5 (*)    INR 1.3 (*)    All other components within normal limits  TROPONIN I (HIGH SENSITIVITY) - Abnormal; Notable for the following components:   Troponin I (High Sensitivity) 41 (*)    All other components within normal limits  HEPATIC FUNCTION PANEL  LIPASE, BLOOD  TROPONIN I (HIGH SENSITIVITY)    EKG EKG Interpretation Date/Time:  Monday July 03 2023 14:41:15 EDT Ventricular Rate:  99 PR Interval:    QRS Duration:  97 QT Interval:  331 QTC Calculation: 425 R Axis:   -44  Text Interpretation: Atrial fibrillation Left axis deviation Low voltage, precordial leads Anteroseptal infarct, age indeterminate Nonspecific T abnormalities, lateral leads No significant change since last tracing Confirmed by Elayne Snare ([id number]) on 07/03/2023 2:50:45 PM  Radiology No results found.  Procedures Procedures    Medications Ordered in ED Medications  0.9 %  sodium chloride infusion (0 mLs Intravenous Stopped 07/03/23 1405)  ondansetron (ZOFRAN) injection 4 mg (4 mg Intravenous Given 07/03/23 1352)  sodium chloride 0.9 % bolus 500 mL (500 mLs Intravenous  New Bag/Given 07/03/23 1452)  pantoprazole (PROTONIX) injection 40 mg (40 mg Intravenous Given 07/03/23 1453)  oxyCODONE (Oxy IR/ROXICODONE) immediate release tablet 5 mg (5 mg Oral Given 07/03/23 1454)    ED Course/ Medical Decision Making/ A&P Clinical Course as of 07/03/23 1537  Mon Jul 03, 2023  1537 Patient signed out to Dr. Jeraldine Loots pending labs, po challenge and reassessment. [VK]    Clinical Course User Index [VK] Rexford Maus, DO                                 Medical Decision Making This patient presents to the ED with chief complaint(s) of N/V, hypotension with pertinent past medical history of cirrhosis s/p recent TIPS procedure, a fib not on [facility] which further complicates the presenting complaint. The complaint involves an extensive differential diagnosis and also carries with it a high risk of complications and morbidity.    The differential diagnosis includes dehydration, electrolyte abnormality, anemia, arrhythmia, infection, having normal bowel movements and abdomen is soft and nondistended making an obstruction unlikely  Additional history obtained: Additional history obtained from N/A Records reviewed previous admission documents  ED Course and Reassessment: On patient's arrival initial blood pressure was 80/50, improved to the 90s without any intervention, otherwise hemodynamically stable in no acute distress.  Patient had labs initially performed by triage including CBC, BMP and troponin.  CBC showed a hemoglobin of 7.3 which is stable from her baseline, no significant abnormality of her BMP with improving creatinine.  Troponin was mildly elevated at 41.  The patient will additionally have LFTs, lipase and coags and will need repeat troponin.  She is given Zofran for her nausea and will be given gentle fluids as well as PPI and Percocet for pain and will be closely reassessed.  Independent labs interpretation:  The following labs were independently interpreted:  Anemia and creatinine at baseline, labs otherwise pending  Independent visualization of imaging: - N/A     Amount and/or Complexity of Data Reviewed Labs: ordered.  Risk Prescription drug management.          Final Clinical Impression(s) / ED Diagnoses Final diagnoses:  None    Rx / DC Orders ED Discharge Orders     None         Rexford Maus, DO 07/03/23 1537

## 2023-07-03 NOTE — ED Notes (Signed)
Patient transported to CT 

## 2023-07-03 NOTE — Plan of Care (Incomplete)
67 year old female with history of A-fib not on anticoagulation, CHF, hypertension, type 2 diabetes, CKD stage IIIa, Nash cirrhosis, gastroesophageal varices, chronic thrombocytopenia, anemia, abdominal wall cellulitis.  Recently admitted TIPS creation on 06/30/2023 which was unsuccessful but they were able to perform balloon occlusion retrograde transvenous obliteration of the gastric varix.  Postprocedure she developed hypotension requiring pressor support.  Also developed A-fib with RVR treated with oral doses of metoprolol and Cardizem.  Hospital course further complicated by acute blood loss anemia due to small to moderate volume hemoperitoneum.  She was discharged from the hospital yesterday.  Patient presents to the ED this evening for evaluation of nausea, vomiting, and hypotension.  Blood pressure 80/50 on arrival.  Tachycardic.  Afebrile.  Labs notable for WBC 6.6, hemoglobin 7.3 (was 7.0 on discharge yesterday), platelet count 46k (was 51k yesterday), sodium 136, potassium 3.6, chloride 103, bicarb 21, BUN 32, creatinine 1.8 (was 1.9 yesterday), glucose 205, calcium 8.4, albumin 2.6, AST 105, ALT 59, alk phos 127, T. bili 5.2 (LFTs overall stable since discharge), INR 1.3, lipase 33, troponin 41> 50.  EKG showing A-fib and no acute ischemic changes. CT abdomen pelvis showing decrease in size of hemoperitoneum, stable L>R pleural effusions, and no acute findings. EDP discussed the case with Dr. Fredia Sorrow, IR team will see the patient in consultation.  Requested admission by medicine service.  Patient was given Zofran, Oxy IR 5 mg, IV Protonix 40 mg, ceftriaxone, and 500 mL normal saline.  Blood pressure improved after fluid bolus.  However, patient complained of shortness of breath and desatted to 85% on room air, placed on 2 L [place].  No chest imaging done.  Patient persistently tachycardic and lactate not checked.  TRH called to admit.          A-fib not on anticoagulation, CHF, hypertension,  type 2 diabetes, CKD stage IIIa, Nash cirrhosis, gastroesophageal varices, chronic thrombocytopenia, anemia, abdominal wall cellulitis.  Recently admitted TIPS creation on 06/30/2023 which was unsuccessful but they were able to perform balloon occlusion retrograde transvenous obliteration of the gastric varix.  Postprocedure she developed hypotension requiring pressor support.  Also developed A-fib with RVR treated with oral doses of metoprolol and Cardizem.  Hospital course further complicated by acute blood loss anemia due to small to moderate volume hemoperitoneum.  She was discharged from the hospital yesterday.  Patient presents to the ED this evening for evaluation of nausea, vomiting, and hypotension.  Blood pressure 80/50 on arrival.  Tachycardic.  Afebrile.  Labs notable for WBC 6.6, hemoglobin 7.3 (was 7.0 on discharge yesterday), platelet count 46k (was 51k yesterday), sodium 136, potassium 3.6, chloride 103, bicarb 21, BUN 32, creatinine 1.8 (was 1.9 yesterday), glucose 205, calcium 8.4, albumin 2.6, AST 105, ALT 59, alk phos 127, T. bili 5.2 (LFTs overall stable since discharge), INR 1.3, lipase 33, troponin 41> 50.  EKG showing A-fib and no acute ischemic changes. CT abdomen pelvis showing decrease in size of hemoperitoneum, stable L>R pleural effusions, and no acute findings. EDP discussed the case with Dr. Fredia Sorrow, IR team will see the patient in consultation.  Requested admission by medicine service.  Patient was given Zofran, Oxy IR 5 mg, IV Protonix 40 mg, ceftriaxone, and 500 mL normal saline.  Blood pressure improved after fluid bolus.  However, patient complained of shortness of breath and desatted to 85% on room air, placed on 2 L [place].  No chest imaging done.  Patient persistently tachycardic and lactate not checked.  TRH called to admit.  Echocardiogram done 03/07/2023 showing EF 55 to 60%, mildly elevated pulmonary artery systolic pressure, moderate tricuspid regurgitation,, mild to  moderate aortic stenosis.   A-fib with RVR

## 2023-07-03 NOTE — ED Provider Notes (Signed)
After my initial evaluation I was called back by the patient with concern for erythema, warmth on the anterior abdomen.  She notes a history of recurrent panniculitis, and this may represent that entity.  With her reported hypotension, consideration of cellulitis contributing to that patient received ceftriaxone.   Gerhard Munch, MD 07/03/23 2328

## 2023-07-03 NOTE — H&P (Incomplete)
History and Physical    Hannah Khan ZOX:[id number] DOB: 1956-04-30 DOA: 07/03/2023  PCP: Darrow Bussing, MD  Patient coming from: Home  Chief Complaint: Vomiting  HPI: Hannah Khan is a 67 y.o. female with medical history significant of A-fib not on anticoagulation, CHF, hypertension, type 2 diabetes, CKD stage IIIb, Nash cirrhosis, gastroesophageal varices, chronic thrombocytopenia, anemia, abdominal wall cellulitis, severe morbid obesity. Recently admitted TIPS creation on 06/30/2023 which was unsuccessful but they were able to perform balloon occlusion retrograde transvenous obliteration of the gastric varix. Postprocedure she developed hypotension requiring pressor support. Also developed A-fib with RVR treated with oral doses of metoprolol and Cardizem. Hospital course further complicated by acute blood loss anemia due to small to moderate volume hemoperitoneum. She was discharged from the hospital yesterday.  Patient presents to the ED this evening for evaluation of nausea, vomiting, and hypotension.  Blood pressure 80/50 on arrival.  In A-fib with heart rate in the 110s to 130s.  Afebrile.  Labs notable for WBC 6.6, hemoglobin 7.3 (was 7.0 on discharge yesterday), platelet count 46k (was 51k yesterday), sodium 136, potassium 3.6, chloride 103, bicarb 21, BUN 32, creatinine 1.8 (was 1.9 yesterday), glucose 205, calcium 8.4, albumin 2.6, AST 105, ALT 59, alk phos 127, T. bili 5.2 (LFTs overall stable since discharge), INR 1.3, lipase 33, lactate 1.6, troponin 41> 50.  EKG showing A-fib and no acute ischemic changes. CT abdomen pelvis showing decrease in size of hemoperitoneum, stable L>R pleural effusions, and no acute findings. EDP discussed the case with Dr. Fredia Sorrow, IR team will see the patient in consultation.  Requested admission by medicine service.  Patient was given Zofran, Oxy IR 5 mg, IV Protonix 40 mg, and 500 mL normal saline.  She was also given ceftriaxone for possible  abdominal wall cellulitis/panniculitis.  Blood pressure improved after fluid bolus.  However, patient complained of shortness of breath and desatted to 85% on room air, placed on 2 L [place].  Chest x-ray pending.  Patient is upset that she has continued to vomit since after her recent TIPS procedure but was still discharged from the hospital yesterday.  She was not able to tolerate any p.o. intake at home.  She is having pain across her upper abdomen since after the procedure but thinks the pain is stable and not any worse.  Denies fevers.  She is endorsing shortness of breath and requesting her home CPAP for sleep apnea.  Denies chest pain.  Review of Systems:  Review of Systems  All other systems reviewed and are negative.   Past Medical History:  Diagnosis Date  . Anemia    hx of  . Aortic stenosis 02/2023   Echo 03/07/23 LVEF 55-60%, mild to moderate AS  . Arthritis    knees  . CHF (congestive heart failure) (HCC)    chronic diastolic  . Diabetes mellitus without complication (HCC)    type 2  . Dyspnea   . Dysrhythmia   . Fibromyalgia   . Gastric varices    large  . H/O transfusion of platelets   . Heart murmur    never has caused any problems  . Hx of colonic polyps    s/p partial colectomy  . Hyperlipidemia   . Hypertension   . Joint pain    in knees and back spasms  . Left leg swelling    wear compression hose  . Liver cirrhosis secondary to NASH (nonalcoholic steatohepatitis) (HCC) dx nov 2014   had enlarged spleen  also   . Morbid obesity (HCC)   . Persistent atrial fibrillation (HCC)   . Pneumonia 10/2016   x 5  . PONV (postoperative nausea and vomiting)    in past none recent  . Portal hypertension (HCC)   . Sleep apnea    uses cpap, setting varies between 14-20  . Spleen enlarged   . 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 [facility])     Past Surgical History:  Procedure Laterality Date  . BREAST LUMPECTOMY WITH RADIOACTIVE SEED LOCALIZATION Right 08/29/2017    Procedure: RIGHT BREAST LUMPECTOMY WITH RADIOACTIVE SEEDS LOCALIZATION;  Surgeon: Harriette Bouillon, MD;  Location: [facility] OR;  Service: General;  Laterality: Right;  . CESAREAN SECTION  1989  . CHOLECYSTECTOMY  20 yrs ago  . COLECTOMY  2011  . COLONOSCOPY    . COLONOSCOPY N/A 09/05/2018   Procedure: COLONOSCOPY;  Surgeon: Willis Modena, MD;  Location: [facility] END[facility];  Service: Endoscopy;  Laterality: N/A;  . COLONOSCOPY WITH PROPOFOL Bilateral 05/04/2022   Procedure: COLONOSCOPY WITH PROPOFOL;  Surgeon: Willis Modena, MD;  Location: [facility];  Service: Gastroenterology;  Laterality: Bilateral;  . DILATATION & CURETTAGE/HYSTEROSCOPY WITH MYOSURE N/A 05/06/2016   Procedure: DILATATION & CURETTAGE/HYSTEROSCOPY WITH MYOSURE WITH POLYPECTOMY;  Surgeon: Myna Hidalgo, DO;  Location: [facility] ORS;  Service: Gynecology;  Laterality: N/A;  . ESOPHAGOGASTRODUODENOSCOPY (EGD) WITH PROPOFOL N/A 09/04/2013   Procedure: ESOPHAGOGASTRODUODENOSCOPY (EGD) WITH PROPOFOL;  Surgeon: Willis Modena, MD;  Location: [facility];  Service: Endoscopy;  Laterality: N/A;  . ESOPHAGOGASTRODUODENOSCOPY (EGD) WITH PROPOFOL Bilateral 05/04/2022   Procedure: ESOPHAGOGASTRODUODENOSCOPY (EGD) WITH PROPOFOL;  Surgeon: Willis Modena, MD;  Location: [facility];  Service: Gastroenterology;  Laterality: Bilateral;  . IR EMBO ART  VEN HEMORR LYMPH EXTRAV  INC GUIDE ROADMAPPING  06/30/2023  . IR INTRAVASCULAR ULTRASOUND NON CORONARY  12/23/2022  . IR RADIOLOGIST EVAL & MGMT  09/21/2022  . IR RADIOLOGIST EVAL & MGMT  10/06/2022  . IR RADIOLOGIST EVAL & MGMT  11/02/2022  . IR RADIOLOGIST EVAL & MGMT  01/31/2023  . IR RADIOLOGIST EVAL & MGMT  05/31/2023  . IR TIPS  12/23/2022  . IR TIPS  06/30/2023  . IR US GUIDE VASC ACCESS LEFT  06/30/2023  . IR US GUIDE VASC ACCESS RIGHT  12/23/2022  . IR US GUIDE VASC ACCESS RIGHT  06/30/2023  . IR US GUIDE VASC ACCESS RIGHT  06/30/2023  . KNEE ARTHROSCOPY  yrs ago   bilateral, one done x 1, one done twice   . POLYPECTOMY  09/05/2018   Procedure: POLYPECTOMY;  Surgeon: Willis Modena, MD;  Location: [facility];  Service: Endoscopy;;  . RADIOLOGY WITH ANESTHESIA N/A 12/23/2022   Procedure: TIPS;  Surgeon: Bennie Dallas, MD;  Location: [facility] OR;  Service: Radiology;  Laterality: N/A;  . RIGHT HEART CATH N/A 05/19/2023   Procedure: RIGHT HEART CATH;  Surgeon: Dorthula Nettles, DO;  Location: [facility] INVASIVE CV [facility];  Service: Cardiovascular;  Laterality: N/A;  . TIPS PROCEDURE N/A 06/30/2023   Procedure: TRANS-JUGULAR INTRAHEPATIC PORTAL SHUNT (TIPS);  Surgeon: Bennie Dallas, MD;  Location: [facility] OR;  Service: Radiology;  Laterality: N/A;     reports that she quit smoking about 48 years ago. Her smoking use included cigarettes. She started smoking about 51 years ago. She has a 3 pack-year smoking history. She has never used smokeless tobacco. She reports that she does not currently use alcohol. She reports that she does not use drugs.  Allergies  Allergen Reactions  . Celebrex [Celecoxib] Other (See Comments)    "  speech slurred" with celebrex  . Shellfish Allergy Swelling and Rash    TINGLING AND SWELLING OF LIPS. LARGE WHELPS QUARTER-SIZE  . Barium-Containing Compounds Other (See Comments)    TACHYCARDIA  . Prednisone Other (See Comments)    TACHYCARDIA  . Statins Other (See Comments)    AGGRAVATED FIBROMYALGIA  . Fentanyl     Hallucinations   . Ibuprofen Other (See Comments)    UNSPECIFIED SPECIFIC REACTION >> "DUE TO PLATELETS"  . Sglt2 Inhibitors     Ozempic Severe gastroparesis   . Tramadol     Hallucination   . Tylenol [Acetaminophen] Other (See Comments)    UNSPECIFIED SPECIFIC REACTION >> "DUE TO PLATELETS"  . Zetia [Ezetimibe]     Headache    Family History  Problem Relation Age of Onset  . Heart failure Mother   . Cancer Mother   . Hyperlipidemia Mother   . Congestive Heart Failure Mother   . Stroke Mother   . Lung cancer Father   . Cancer Father   . Cancer Brother    . Hyperlipidemia Brother   . Stroke Other   . Breast cancer Neg Hx     Prior to Admission medications   Medication Sig Start Date End Date Taking? Authorizing Provider  albuterol (VENTOLIN HFA) 108 (90 Base) MCG/ACT inhaler Inhale 1-2 puffs into the lungs every 6 (six) hours as needed for wheezing or shortness of breath.   Yes [provider]  digoxin (LANOXIN) 0.125 MG tablet Take 1 tablet (0.125 mg total) by mouth daily. 04/28/23  Yes Sabharwal, Aditya, DO  diltiazem (CARDIZEM CD) 180 MG 24 hr capsule Take 1 capsule (180 mg total) by mouth daily. 04/18/23  Yes Alver Sorrow, NP  lactulose (CHRONULAC) 10 GM/15ML solution Take 20 g by mouth 3 (three) times daily. 03/17/21  Yes [provider]  Lidocaine HCl-Benzyl Alcohol (SALONPAS LIDOCAINE PLUS) 4-10 % CREA Apply 1 Application topically daily as needed (knee pain).   Yes [provider]  metolazone (ZAROXOLYN) 5 MG tablet Take 1 tablet (5 mg total) by mouth once a week. Take one tablet on Sundays 30 minutes before Bumex 02/28/23  Yes Alver Sorrow, NP  metoprolol tartrate (LOPRESSOR) 25 MG tablet Take 0.5 tablets (12.5 mg total) by mouth 2 (two) times daily. 05/08/23  Yes [place], Anderson Malta, FNP  midodrine (PROAMATINE) 10 MG tablet Take 1 tablet (10 mg total) by mouth 3 (three) times daily. 04/18/23  Yes Alver Sorrow, NP  Multiple Vitamins-Minerals (PRESERVISION AREDS 2) CAPS Take 1 capsule by mouth 2 (two) times daily.   Yes [provider]  ondansetron (ZOFRAN) 4 MG tablet Take 1 tablet (4 mg total) by mouth every 8 (eight) hours as needed for up to 7 days for nausea or vomiting. 07/02/23 07/09/23 Yes Covington, Arman Filter, NP  oxycodone (OXY-IR) 5 MG capsule Take 1 capsule (5 mg total) by mouth every 4 (four) hours as needed for up to 5 days for pain. 07/02/23 07/07/23 Yes Covington, Arman Filter, NP  potassium chloride (MICRO-K) 10 MEQ CR capsule Take 10 capsules (100 mEq total) by mouth 2 (two) times  daily. Take an extra 4 tablets when taking Metolazone. 06/15/23  Yes Andrey Farmer, PA-C  Propylene Glycol (SYSTANE BALANCE) 0.6 % SOLN Place 1 drop into both eyes 2 (two) times daily.   Yes [provider]  torsemide (DEMADEX) 20 MG tablet Take 4 tablets (80 mg total) by mouth every morning AND 3 tablets (60 mg total)  every evening. 06/01/23  Yes Alen Bleacher, NP  XIFAXAN 550 MG TABS tablet Take 550 mg by mouth 2 (two) times daily. 03/13/21  Yes [provider]  doxycycline (ADOXA) 100 MG tablet Take 100 mg by mouth 2 (two) times daily. Per patient last dose will be Friday 06-30-23 Patient not taking: Reported on 07/03/2023    [provider]  Lancets [facility] DELICA PLUS [place]) MISC Apply topically 3 (three) times daily. 04/10/20   [provider]  [facility] VERIO test strip 1 each 3 (three) times daily. 04/13/20   [provider]    Physical Exam: Vitals:   07/03/23 2030 07/03/23 2100 07/03/23 2130 07/03/23 2200  BP:      Pulse: (!) 121 84 (!) 122 (!) 129  Resp: 17 (!) 21 17 (!) 23  Temp:      TempSrc:      SpO2: 96% 99% (!) 89% 98%    Physical Exam Vitals reviewed.  Constitutional:      General: She is not in acute distress. HENT:     Head: Normocephalic and atraumatic.  Eyes:     Extraocular Movements: Extraocular movements intact.  Cardiovascular:     Rate and Rhythm: Tachycardia present. Rhythm irregular.     Pulses: Normal pulses.  Pulmonary:     Effort: Pulmonary effort is normal. No respiratory distress.     Breath sounds: Rales present. No wheezing.  Abdominal:     General: Bowel sounds are normal. There is no distension.     Palpations: Abdomen is soft.     Tenderness: There is no abdominal tenderness.  Musculoskeletal:     Cervical back: Normal range of motion.     Right lower leg: Edema present.     Left lower leg: Edema present.     Comments: 3+ pitting edema of bilateral lower extremities  Skin:     General: Skin is warm and dry.  Neurological:     General: No focal deficit present.     Mental Status: She is alert and oriented to person, place, and time.     Labs on Admission: I have personally reviewed following labs and imaging studies  CBC: Recent Labs  Lab 06/30/23 1525 07/01/23 0300 07/01/23 1653 07/02/23 0945 07/03/23 1307  WBC 8.4 8.6 10.3 6.6 6.6  HGB 8.0* 7.7* 7.8* 7.0* 7.3*  HCT 24.4* 23.1* 23.3* 20.8* 21.9*  MCV 99.2 99.1 100.4* 98.6 100.9*  PLT 66* 64* 67* 51* 46*   Basic Metabolic Panel: Recent Labs  Lab 06/30/23 0549 06/30/23 1015 07/01/23 0300 07/01/23 1653 07/02/23 0945 07/03/23 1307  NA 140 140 137 138 134* 136  K 3.0* 3.1* 3.6 3.5 3.5 3.6  CL 102  --  105 103 102 103  CO2 23  --  23 24 21* 21*  GLUCOSE 137*  --  214* 186* 201* 205*  BUN 15  --  24* 29* 31* 32*  CREATININE 1.39*  --  1.97* 2.25* 1.97* 1.87*  CALCIUM 9.0  --  8.6* 8.9 8.6* 8.4*   GFR: Estimated Creatinine Clearance: 44.7 mL/min (A) (by C-G formula based on SCr of 1.87 mg/dL (H)). Liver Function Tests: Recent Labs  Lab 06/30/23 0549 07/01/23 0300 07/01/23 1653 07/02/23 0945 07/03/23 1500  AST 55* 100* 183* 140* 105*  ALT 32 38 62* 65* 59*  ALKPHOS 95 73 105 109 127*  BILITOT 3.9* 3.0* 7.7* 5.1* 5.2*  PROT 6.6 5.4* 5.8* 5.6* 5.8*  ALBUMIN 2.6* 2.4* 2.7* 2.5* 2.6*  Recent Labs  Lab 07/03/23 1500  LIPASE 33   No results for input(s): "AMMONIA" in the last 168 hours. Coagulation Profile: Recent Labs  Lab 06/30/23 0549 07/02/23 0945 07/03/23 1500  INR 1.2 1.4* 1.3*   Cardiac Enzymes: No results for input(s): "CKTOTAL", "CKMB", "CKMBINDEX", "TROPONINI" in the last 168 hours. BNP (last 3 results) No results for input(s): "PROBNP" in the last 8760 hours. HbA1C: No results for input(s): "HGBA1C" in the last 72 hours. CBG: Recent Labs  Lab 07/01/23 1136 07/01/23 1557 07/01/23 2121 07/02/23 0623 07/02/23 1138  GLUCAP 214* 181* 183* 130* 153*   Lipid  Profile: No results for input(s): "CHOL", "HDL", "LDLCALC", "TRIG", "CHOLHDL", "LDLDIRECT" in the last 72 hours. Thyroid Function Tests: No results for input(s): "TSH", "T4TOTAL", "FREET4", "T3FREE", "THYROIDAB" in the last 72 hours. Anemia Panel: No results for input(s): "VITAMINB12", "FOLATE", "FERRITIN", "TIBC", "IRON", "RETICCTPCT" in the last 72 hours. Urine analysis:    Component Value Date/Time   COLORURINE YELLOW 04/06/2023 1047   APPEARANCEUR CLEAR 04/06/2023 1047   LABSPEC 1.034 (H) 04/06/2023 1047   PHURINE 5.0 04/06/2023 1047   GLUCOSEU NEGATIVE 04/06/2023 1047   HGBUR NEGATIVE 04/06/2023 1047   BILIRUBINUR NEGATIVE 04/06/2023 1047   KETONESUR NEGATIVE 04/06/2023 1047   PROTEINUR NEGATIVE 04/06/2023 1047   UROBILINOGEN 2.0 (H) 01/31/2015 0055   NITRITE NEGATIVE 04/06/2023 1047   LEUKOCYTESUR NEGATIVE 04/06/2023 1047    Radiological Exams on Admission: CT ABDOMEN PELVIS WO CONTRAST  Result Date: 07/03/2023 CLINICAL DATA:  Abdominal pain. Generalized weakness and hypotension. Hemoperitoneum EXAM: CT ABDOMEN AND PELVIS WITHOUT CONTRAST TECHNIQUE: Multidetector CT imaging of the abdomen and pelvis was performed following the standard protocol without IV contrast. RADIATION DOSE REDUCTION: This exam was performed according to the departmental dose-optimization program which includes automated exposure control, adjustment of the mA and/or kV according to patient size and/or use of iterative reconstruction technique. COMPARISON:  CT abdomen and pelvis without contrast 07/01/23 FINDINGS: Lower chest: Left greater than right pleural effusions are stable. Mild atelectasis is present at the left base. The heart is enlarged. Blood chambers are hypodense, consistent with anemia. Hepatobiliary: Nodular irregularity is present within the liver. No discrete lesions are present. Hemoperitoneum along the anterior margin of the liver is decreased in size compared to the prior exam.  Cholecystectomy noted. No significant intra or extrahepatic biliary dilation is present. Pancreas: Unremarkable. No pancreatic ductal dilatation or surrounding inflammatory changes. Spleen: Splenomegaly again noted. Adrenals/Urinary Tract: The adrenal glands are normal bilaterally. Bilateral simple cysts are stable. The ureters are within normal limits. The urinary bladder is mostly collapsed. Stomach/Bowel: The stomach and duodenum are within normal limits. Small bowel is unremarkable. Terminal ileum is normal. Appendectomy noted. Ascending and transverse colon within normal limits. Descending and sigmoid colon are normal. Vascular/Lymphatic: Scattered atherosclerotic calcifications are present within the aorta and branch vessels. No aneurysm is present. Cavernous transformation of the portal vein is again noted. Aneurysm coils are present. No significant adenopathy is present. Reproductive: IUD remains. Uterus and adnexa are otherwise within normal limits. Other: Hemoperitoneum extending into the pelvis is decreased compared to the prior exam. No new hemorrhage is present. Diffuse subcutaneous edema is present. No free air is present. Musculoskeletal: Mild degenerative changes are again noted in the thoracolumbar spine. No focal osseous lesions are present. Bony pelvis is normal. The hips are located and within normal limits bilaterally. IMPRESSION: 1. Decreased hemoperitoneum compared to the prior exam. 2. Stable left greater than right pleural effusions. 3. Stable cardiomegaly. 4. Anemia.  5. Stable splenomegaly. 6. Stable simple cysts of both kidneys. 7. Cavernous transformation of the portal vein. 8.  Aortic Atherosclerosis (ICD10-I70.0). Electronically Signed   By: Marin Roberts M.D.   On: 07/03/2023 21:59    Assessment and Plan  Nausea and vomiting Hypotension Recently admitted TIPS creation on 06/30/2023 which was unsuccessful but they were able to perform balloon occlusion retrograde  transvenous obliteration of the gastric varix.  Postop she had developed acute blood loss anemia due to hemoperitoneum.  Patient states she has continued to vomit since after the procedure and was not able to tolerate p.o. intake at home.  She is endorsing some pain across her upper abdomen since after the procedure which she thinks is stable and not any worse.  She is tachycardic but afebrile.  SBP initially 80, now improved to 110 after 500 mL IV fluids.  No leukocytosis or lactic acidosis on labs to suggest sepsis.  Hypotension likely due to dehydration.  She does take midodrine at home for chronic hypotension.  Lipase is normal.  LFTs elevated but overall stable since discharge.  Repeat CT done today showing decrease in size of hemoperitoneum and no other acute intra-abdominal findings.  Hemoglobin stable since discharge.  Patient symptoms have improved.  No longer nauseous or vomiting.  She was able to eat half a sandwich and applesauce in the ED.  IR has been consulted by ED physician.  Continue antiemetic as needed.  Monitor CBC and LFTs.  Trend lactate.  Blood cultures ordered.  A-fib with RVR Likely due to vomiting and dehydration.  Rate currently mostly in the 110s, intermittently as high as 130s but not sustained.  It is best to hold metoprolol and Cardizem at this time given hypotension at the time of presentation.  Not on anticoagulation due to cirrhosis/chronic thrombocytopenia.  ?Abdominal wall cellulitis/panniculitis Difficult to appreciate on exam given patient's body habitus/severe morbid obesity.  No fever or leukocytosis.  She was given a dose of ceftriaxone in the ED.  Reassess in the morning.  Hypoxia Patient desatted to 85% on room air and complained of shortness of breath after receiving 500 mL IV fluids.  Currently satting well on 2 L [place] and no signs of respiratory distress.  She does endorse history of sleep apnea and uses CPAP at night.  CT abdomen pelvis showing stable L>R  pleural effusions.  Chest x-ray pending.  Continue supplemental oxygen to keep oxygen saturation above 94%.  CPAP at night.  Chronic hypotension Continue midodrine.  Chronic HFpEF Continue digoxin.  Hold diuretic at this time due to concern for hypotension.  Echocardiogram done 03/07/2023 showing EF 55 to 60%, mildly elevated pulmonary artery systolic pressure, moderate tricuspid regurgitation,, mild to moderate aortic stenosis.   Elevated troponin Troponin mildly elevated (41> 50).  Likely due to demand ischemia.  ACS less likely as patient denies chest pain and EKG without acute ischemic changes.  Continue to trend troponin.  NASH cirrhosis Status post recent unsuccessful TIPS procedure 4 days ago.  IR will consult in the morning.  Continue lactulose and rifaximin.  Anemia Hemoglobin currently 7.3, stable since discharge yesterday.  Hemoperitoneum appears stable/decreased in size on CT.  Type and screen, continue to monitor H&H.  Transfuse if hemoglobin less than 7.  Chronic thrombocytopenia Platelet count 46k, was 51K on discharge yesterday.  Continue to monitor CBC.  CKD stage IIIb Creatinine currently 1.8, stable compared to labs done over the past few days but baseline appears to be 1.2-1.5.  Patient was  given IV fluids in the ED.  Continue to monitor renal function and avoid nephrotoxic agents.  Type 2 diabetes Glucose 205.  Last A1c 4.7 in February 2024, repeat ordered.  Sensitive sliding scale insulin every 4 hours for now until patient's p.o. intake improves.  DVT prophylaxis: {Blank single:19197::"Lovenox","SQ Heparin","IV heparin gtt","Xarelto","Eliquis","Coumadin","SCDs","***"} Code Status: {Blank single:19197::"Full Code","DNR","DNR/DNI","Comfort Care","***"} Family Communication: ***  Consults called: ***  Level of care: {Blank single:19197::"Med-Surg","Telemetry bed","Progressive Care Unit","Step Down Unit"} Admission status: *** Time Spent: 75+  minutes***  John Giovanni MD [facility]  If 7PM-7AM, please contact night-coverage [email or web address]  07/03/2023, 11:26 PM

## 2023-07-03 NOTE — ED Provider Notes (Signed)
Care of the patient assumed at signout.  On my initial evaluation the patient she has mild tachycardia, but no hypotension.  She continues to complain of pain.  Labs notable for slight elevation in troponin.  I discussed her presentation with the interventional radiology team who performed her TI PS procedure a few days ago. Dr. Fredia Sorrow notes the IR team will follow as a consulting service, as the patient will be admitted to the internal medicine team.   Gerhard Munch, MD 07/03/23 [phone]

## 2023-07-04 ENCOUNTER — Inpatient Hospital Stay (HOSPITAL_COMMUNITY): Payer: Medicare PPO

## 2023-07-04 ENCOUNTER — Encounter (HOSPITAL_COMMUNITY): Payer: Self-pay | Admitting: Internal Medicine

## 2023-07-04 DIAGNOSIS — D72819 Decreased white blood cell count, unspecified: Secondary | ICD-10-CM | POA: Diagnosis present

## 2023-07-04 DIAGNOSIS — E860 Dehydration: Secondary | ICD-10-CM | POA: Diagnosis present

## 2023-07-04 DIAGNOSIS — I4891 Unspecified atrial fibrillation: Secondary | ICD-10-CM | POA: Diagnosis not present

## 2023-07-04 DIAGNOSIS — R112 Nausea with vomiting, unspecified: Secondary | ICD-10-CM | POA: Diagnosis not present

## 2023-07-04 DIAGNOSIS — I2489 Other forms of acute ischemic heart disease: Secondary | ICD-10-CM | POA: Diagnosis present

## 2023-07-04 DIAGNOSIS — H353221 Exudative age-related macular degeneration, left eye, with active choroidal neovascularization: Secondary | ICD-10-CM | POA: Diagnosis not present

## 2023-07-04 DIAGNOSIS — K661 Hemoperitoneum: Secondary | ICD-10-CM | POA: Diagnosis present

## 2023-07-04 DIAGNOSIS — J9601 Acute respiratory failure with hypoxia: Secondary | ICD-10-CM | POA: Diagnosis present

## 2023-07-04 DIAGNOSIS — E872 Acidosis, unspecified: Secondary | ICD-10-CM | POA: Diagnosis present

## 2023-07-04 DIAGNOSIS — D696 Thrombocytopenia, unspecified: Secondary | ICD-10-CM | POA: Diagnosis not present

## 2023-07-04 DIAGNOSIS — I5031 Acute diastolic (congestive) heart failure: Secondary | ICD-10-CM | POA: Diagnosis not present

## 2023-07-04 DIAGNOSIS — K766 Portal hypertension: Secondary | ICD-10-CM | POA: Diagnosis present

## 2023-07-04 DIAGNOSIS — E113293 Type 2 diabetes mellitus with mild nonproliferative diabetic retinopathy without macular edema, bilateral: Secondary | ICD-10-CM | POA: Diagnosis not present

## 2023-07-04 DIAGNOSIS — H2513 Age-related nuclear cataract, bilateral: Secondary | ICD-10-CM | POA: Diagnosis not present

## 2023-07-04 DIAGNOSIS — D638 Anemia in other chronic diseases classified elsewhere: Secondary | ICD-10-CM | POA: Diagnosis present

## 2023-07-04 DIAGNOSIS — D684 Acquired coagulation factor deficiency: Secondary | ICD-10-CM | POA: Diagnosis present

## 2023-07-04 DIAGNOSIS — K7581 Nonalcoholic steatohepatitis (NASH): Secondary | ICD-10-CM | POA: Diagnosis not present

## 2023-07-04 DIAGNOSIS — R0902 Hypoxemia: Secondary | ICD-10-CM

## 2023-07-04 DIAGNOSIS — D649 Anemia, unspecified: Secondary | ICD-10-CM | POA: Diagnosis not present

## 2023-07-04 DIAGNOSIS — H353112 Nonexudative age-related macular degeneration, right eye, intermediate dry stage: Secondary | ICD-10-CM | POA: Diagnosis not present

## 2023-07-04 DIAGNOSIS — I81 Portal vein thrombosis: Secondary | ICD-10-CM | POA: Diagnosis present

## 2023-07-04 DIAGNOSIS — I8510 Secondary esophageal varices without bleeding: Secondary | ICD-10-CM | POA: Diagnosis present

## 2023-07-04 DIAGNOSIS — I5032 Chronic diastolic (congestive) heart failure: Secondary | ICD-10-CM | POA: Diagnosis not present

## 2023-07-04 DIAGNOSIS — K7469 Other cirrhosis of liver: Secondary | ICD-10-CM | POA: Diagnosis not present

## 2023-07-04 DIAGNOSIS — K7460 Unspecified cirrhosis of liver: Secondary | ICD-10-CM | POA: Diagnosis not present

## 2023-07-04 DIAGNOSIS — I272 Pulmonary hypertension, unspecified: Secondary | ICD-10-CM | POA: Diagnosis present

## 2023-07-04 DIAGNOSIS — H18413 Arcus senilis, bilateral: Secondary | ICD-10-CM | POA: Diagnosis not present

## 2023-07-04 DIAGNOSIS — E1122 Type 2 diabetes mellitus with diabetic chronic kidney disease: Secondary | ICD-10-CM | POA: Diagnosis present

## 2023-07-04 DIAGNOSIS — I4821 Permanent atrial fibrillation: Secondary | ICD-10-CM | POA: Diagnosis present

## 2023-07-04 DIAGNOSIS — I5033 Acute on chronic diastolic (congestive) heart failure: Secondary | ICD-10-CM | POA: Diagnosis present

## 2023-07-04 DIAGNOSIS — L03311 Cellulitis of abdominal wall: Secondary | ICD-10-CM | POA: Diagnosis present

## 2023-07-04 DIAGNOSIS — I082 Rheumatic disorders of both aortic and tricuspid valves: Secondary | ICD-10-CM | POA: Diagnosis present

## 2023-07-04 DIAGNOSIS — D62 Acute posthemorrhagic anemia: Secondary | ICD-10-CM | POA: Diagnosis present

## 2023-07-04 DIAGNOSIS — H2511 Age-related nuclear cataract, right eye: Secondary | ICD-10-CM | POA: Diagnosis not present

## 2023-07-04 DIAGNOSIS — Z6841 Body Mass Index (BMI) 40.0 and over, adult: Secondary | ICD-10-CM | POA: Diagnosis not present

## 2023-07-04 DIAGNOSIS — N179 Acute kidney failure, unspecified: Secondary | ICD-10-CM | POA: Diagnosis present

## 2023-07-04 DIAGNOSIS — N1832 Chronic kidney disease, stage 3b: Secondary | ICD-10-CM | POA: Diagnosis present

## 2023-07-04 DIAGNOSIS — I9589 Other hypotension: Secondary | ICD-10-CM | POA: Diagnosis present

## 2023-07-04 DIAGNOSIS — I130 Hypertensive heart and chronic kidney disease with heart failure and stage 1 through stage 4 chronic kidney disease, or unspecified chronic kidney disease: Secondary | ICD-10-CM | POA: Diagnosis present

## 2023-07-04 DIAGNOSIS — H25043 Posterior subcapsular polar age-related cataract, bilateral: Secondary | ICD-10-CM | POA: Diagnosis not present

## 2023-07-04 LAB — COMPREHENSIVE METABOLIC PANEL
ALT: 52 U/L — ABNORMAL HIGH (ref 0–44)
AST: 83 U/L — ABNORMAL HIGH (ref 15–41)
Albumin: 2.3 g/dL — ABNORMAL LOW (ref 3.5–5.0)
Alkaline Phosphatase: 123 U/L (ref 38–126)
Anion gap: 10 (ref 5–15)
BUN: 34 mg/dL — ABNORMAL HIGH (ref 8–23)
CO2: 22 mmol/L (ref 22–32)
Calcium: 8.5 mg/dL — ABNORMAL LOW (ref 8.9–10.3)
Chloride: 103 mmol/L (ref 98–111)
Creatinine, Ser: 2.03 mg/dL — ABNORMAL HIGH (ref 0.44–1.00)
GFR, Estimated: 26 mL/min — ABNORMAL LOW (ref >60)
Glucose, Bld: 182 mg/dL — ABNORMAL HIGH (ref 70–99)
Potassium: 3.8 mmol/L (ref 3.5–5.1)
Sodium: 135 mmol/L (ref 135–145)
Total Bilirubin: 5.2 mg/dL — ABNORMAL HIGH (ref 0.3–1.2)
Total Protein: 5.5 g/dL — ABNORMAL LOW (ref 6.5–8.1)

## 2023-07-04 LAB — PROTIME-INR
INR: 1.4 — ABNORMAL HIGH (ref 0.8–1.2)
Prothrombin Time: 16.9 s — ABNORMAL HIGH (ref 11.4–15.2)

## 2023-07-04 LAB — MRSA NEXT GEN BY PCR, NASAL: MRSA by PCR Next Gen: NOT DETECTED

## 2023-07-04 LAB — CBC
HCT: 19.8 % — ABNORMAL LOW (ref 36.0–46.0)
Hemoglobin: 6.3 g/dL — CL (ref 12.0–15.0)
MCH: 32.8 pg (ref 26.0–34.0)
MCHC: 31.8 g/dL (ref 30.0–36.0)
MCV: 103.1 fL — ABNORMAL HIGH (ref 80.0–100.0)
Platelets: 24 10*3/uL — CL (ref 150–400)
RBC: 1.92 MIL/uL — ABNORMAL LOW (ref 3.87–5.11)
RDW: 16 % — ABNORMAL HIGH (ref 11.5–15.5)
WBC: 4.3 10*3/uL (ref 4.0–10.5)
nRBC: 0.7 % — ABNORMAL HIGH (ref 0.0–0.2)

## 2023-07-04 LAB — CBG MONITORING, ED
Glucose-Capillary: 155 mg/dL — ABNORMAL HIGH (ref 70–99)
Glucose-Capillary: 186 mg/dL — ABNORMAL HIGH (ref 70–99)
Glucose-Capillary: 187 mg/dL — ABNORMAL HIGH (ref 70–99)
Glucose-Capillary: 161 mg/dL — ABNORMAL HIGH (ref 70–99)
Glucose-Capillary: 196 mg/dL — ABNORMAL HIGH (ref 70–99)

## 2023-07-04 LAB — HEMOGLOBIN A1C
Hgb A1c MFr Bld: 5.2 % (ref 4.8–5.6)
Mean Plasma Glucose: 102.54 mg/dL

## 2023-07-04 LAB — URINALYSIS, ROUTINE W REFLEX MICROSCOPIC
Bilirubin Urine: NEGATIVE
Glucose, UA: NEGATIVE mg/dL
Hgb urine dipstick: NEGATIVE
Ketones, ur: NEGATIVE mg/dL
Leukocytes,Ua: NEGATIVE
Nitrite: NEGATIVE
Protein, ur: NEGATIVE mg/dL
Specific Gravity, Urine: 1.013 (ref 1.005–1.030)
pH: 5 (ref 5.0–8.0)

## 2023-07-04 LAB — TROPONIN I (HIGH SENSITIVITY): Troponin I (High Sensitivity): 38 ng/L — ABNORMAL HIGH (ref <18)

## 2023-07-04 LAB — GLUCOSE, CAPILLARY
Glucose-Capillary: 173 mg/dL — ABNORMAL HIGH (ref 70–99)
Glucose-Capillary: 197 mg/dL — ABNORMAL HIGH (ref 70–99)

## 2023-07-04 LAB — HEMOGLOBIN AND HEMATOCRIT, BLOOD
HCT: 22.5 % — ABNORMAL LOW (ref 36.0–46.0)
Hemoglobin: 7.5 g/dL — ABNORMAL LOW (ref 12.0–15.0)

## 2023-07-04 LAB — PREPARE RBC (CROSSMATCH)

## 2023-07-04 LAB — BRAIN NATRIURETIC PEPTIDE: B Natriuretic Peptide: 531.6 pg/mL — ABNORMAL HIGH (ref 0.0–100.0)

## 2023-07-04 LAB — I-STAT CG4 LACTIC ACID, ED: Lactic Acid, Venous: 2 mmol/L (ref 0.5–1.9)

## 2023-07-04 LAB — LACTIC ACID, PLASMA: Lactic Acid, Venous: 1.4 mmol/L (ref 0.5–1.9)

## 2023-07-04 LAB — APTT: aPTT: 28 s (ref 24–36)

## 2023-07-04 MED ORDER — AMIODARONE HCL IN DEXTROSE 360-4.14 MG/200ML-% IV SOLN
30.0000 mg/h | INTRAVENOUS | Status: DC
  Administered 2023-07-04 – 2023-07-05 (×4): 30 mg/h via INTRAVENOUS
  Filled 2023-07-04 (×5): qty 200

## 2023-07-04 MED ORDER — LACTATED RINGERS IV SOLN: INTRAVENOUS | Status: DC

## 2023-07-04 MED ORDER — METOPROLOL TARTRATE 12.5 MG HALF TABLET
12.5000 mg | ORAL_TABLET | Freq: Two times a day (BID) | ORAL | Status: DC
  Administered 2023-07-04 – 2023-07-06 (×5): 12.5 mg via ORAL
  Filled 2023-07-04 (×5): qty 1

## 2023-07-04 MED ORDER — VANCOMYCIN HCL 1500 MG/300ML IV SOLN: 1500.0000 mg | INTRAVENOUS | Status: DC

## 2023-07-04 MED ORDER — CEPHALEXIN 500 MG PO CAPS
500.0000 mg | ORAL_CAPSULE | Freq: Two times a day (BID) | ORAL | Status: DC
  Administered 2023-07-04 – 2023-07-07 (×7): 500 mg via ORAL
  Filled 2023-07-04 (×2): qty 1
  Filled 2023-07-04: qty 2
  Filled 2023-07-04 (×4): qty 1

## 2023-07-04 MED ORDER — FUROSEMIDE 10 MG/ML IJ SOLN
80.0000 mg | Freq: Two times a day (BID) | INTRAMUSCULAR | Status: DC
  Administered 2023-07-04 – 2023-07-05 (×2): 80 mg via INTRAVENOUS
  Filled 2023-07-04 (×2): qty 8

## 2023-07-04 MED ORDER — VANCOMYCIN HCL 2000 MG/400ML IV SOLN
2000.0000 mg | Freq: Once | INTRAVENOUS | Status: DC
  Filled 2023-07-04: qty 400

## 2023-07-04 MED ORDER — ONDANSETRON HCL 4 MG PO TABS: 4.0000 mg | ORAL_TABLET | Freq: Four times a day (QID) | ORAL | Status: DC | PRN

## 2023-07-04 MED ORDER — RIFAXIMIN 550 MG PO TABS
550.0000 mg | ORAL_TABLET | Freq: Two times a day (BID) | ORAL | Status: DC
  Administered 2023-07-04 – 2023-07-11 (×16): 550 mg via ORAL
  Filled 2023-07-04 (×16): qty 1

## 2023-07-04 MED ORDER — SODIUM CHLORIDE 0.9 % IV SOLN
2.0000 g | Freq: Two times a day (BID) | INTRAVENOUS | Status: DC
  Administered 2023-07-04: 2 g via INTRAVENOUS
  Filled 2023-07-04: qty 12.5

## 2023-07-04 MED ORDER — ONDANSETRON HCL 4 MG/2ML IJ SOLN
4.0000 mg | Freq: Four times a day (QID) | INTRAMUSCULAR | Status: DC | PRN
  Administered 2023-07-04: 4 mg via INTRAVENOUS
  Filled 2023-07-04: qty 2

## 2023-07-04 MED ORDER — LACTULOSE 10 GM/15ML PO SOLN: 20.0000 g | Freq: Three times a day (TID) | ORAL | Status: DC

## 2023-07-04 MED ORDER — TECHNETIUM TO 99M ALBUMIN AGGREGATED
4.4000 | Freq: Once | INTRAVENOUS | Status: AC | PRN
  Administered 2023-07-04: 4.4 via INTRAVENOUS

## 2023-07-04 MED ORDER — LACTULOSE 10 GM/15ML PO SOLN
20.0000 g | Freq: Three times a day (TID) | ORAL | Status: DC
  Administered 2023-07-04 – 2023-07-10 (×17): 20 g via ORAL
  Filled 2023-07-04 (×22): qty 30

## 2023-07-04 MED ORDER — SODIUM CHLORIDE 0.9% IV SOLUTION: Freq: Once | INTRAVENOUS | Status: AC

## 2023-07-04 MED ORDER — NALOXONE HCL 0.4 MG/ML IJ SOLN: 0.4000 mg | INTRAMUSCULAR | Status: DC | PRN

## 2023-07-04 MED ORDER — LACTATED RINGERS IV BOLUS
1000.0000 mL | Freq: Once | INTRAVENOUS | Status: AC
  Administered 2023-07-04: 1000 mL via INTRAVENOUS

## 2023-07-04 MED ORDER — ALBUMIN HUMAN 25 % IV SOLN
50.0000 g | INTRAVENOUS | Status: AC
  Administered 2023-07-04: 50 g via INTRAVENOUS
  Filled 2023-07-04: qty 200

## 2023-07-04 MED ORDER — MIDODRINE HCL 5 MG PO TABS
10.0000 mg | ORAL_TABLET | Freq: Three times a day (TID) | ORAL | Status: DC
  Administered 2023-07-04 (×2): 10 mg via ORAL
  Filled 2023-07-04 (×2): qty 2

## 2023-07-04 MED ORDER — DIGOXIN 125 MCG PO TABS: 0.1250 mg | ORAL_TABLET | Freq: Every day | ORAL | Status: DC

## 2023-07-04 MED ORDER — INSULIN ASPART 100 UNIT/ML IJ SOLN
0.0000 [IU] | INTRAMUSCULAR | Status: DC
  Administered 2023-07-04 – 2023-07-05 (×5): 2 [IU] via SUBCUTANEOUS
  Administered 2023-07-05: 1 [IU] via SUBCUTANEOUS
  Administered 2023-07-05: 2 [IU] via SUBCUTANEOUS
  Administered 2023-07-05: 1 [IU] via SUBCUTANEOUS
  Administered 2023-07-05: 2 [IU] via SUBCUTANEOUS
  Administered 2023-07-06: 1 [IU] via SUBCUTANEOUS
  Administered 2023-07-06 – 2023-07-07 (×5): 2 [IU] via SUBCUTANEOUS
  Administered 2023-07-07: 1 [IU] via SUBCUTANEOUS
  Administered 2023-07-07 – 2023-07-08 (×7): 2 [IU] via SUBCUTANEOUS
  Administered 2023-07-09: 1 [IU] via SUBCUTANEOUS
  Administered 2023-07-09 (×3): 2 [IU] via SUBCUTANEOUS
  Administered 2023-07-09: 1 [IU] via SUBCUTANEOUS
  Administered 2023-07-10: 3 [IU] via SUBCUTANEOUS
  Administered 2023-07-10 – 2023-07-11 (×4): 1 [IU] via SUBCUTANEOUS
  Administered 2023-07-11: 2 [IU] via SUBCUTANEOUS

## 2023-07-04 MED ORDER — DIGOXIN 125 MCG PO TABS
0.1250 mg | ORAL_TABLET | Freq: Every day | ORAL | Status: DC
  Administered 2023-07-04 – 2023-07-08 (×5): 0.125 mg via ORAL
  Filled 2023-07-04 (×5): qty 1

## 2023-07-04 MED ORDER — AMIODARONE LOAD VIA INFUSION
150.0000 mg | Freq: Once | INTRAVENOUS | Status: AC
  Administered 2023-07-04: 150 mg via INTRAVENOUS
  Filled 2023-07-04: qty 83.34

## 2023-07-04 MED ORDER — OXYCODONE HCL 5 MG PO TABS
5.0000 mg | ORAL_TABLET | Freq: Four times a day (QID) | ORAL | Status: DC | PRN
  Administered 2023-07-05 – 2023-07-08 (×3): 5 mg via ORAL
  Filled 2023-07-04 (×4): qty 1

## 2023-07-04 MED ORDER — MIDODRINE HCL 5 MG PO TABS
20.0000 mg | ORAL_TABLET | Freq: Three times a day (TID) | ORAL | Status: DC
  Administered 2023-07-04 – 2023-07-10 (×20): 20 mg via ORAL
  Filled 2023-07-04 (×20): qty 4

## 2023-07-04 MED ORDER — AMIODARONE HCL IN DEXTROSE 360-4.14 MG/200ML-% IV SOLN
60.0000 mg/h | INTRAVENOUS | Status: AC
  Administered 2023-07-04: 60 mg/h via INTRAVENOUS
  Filled 2023-07-04: qty 200

## 2023-07-04 MED ORDER — FUROSEMIDE 20 MG PO TABS
40.0000 mg | ORAL_TABLET | Freq: Once | ORAL | Status: AC
  Administered 2023-07-04: 40 mg via ORAL
  Filled 2023-07-04: qty 2

## 2023-07-04 NOTE — Progress Notes (Signed)
Pt arrived to the floor around 1745 from ED via stretcher. Pt alert and oriented x4 in no acute distress upon arrival to floor. Pt amb with with walker from stretcher to hospital bed. Amio gtt infusing to PIV in left hand/wrist. Respirations even and unlabored on 2 L/min. Pt oriented to room and encouraged to use call bell for assistance. Call bell within reach. Bed in low position.

## 2023-07-04 NOTE — Progress Notes (Signed)
Reviewed on 5w Seems less SoB, VQ neg, BP better  Diurese and hopefuly for conversion to oral Rate control Appreciate Cards  Pleas Koch, MD [facility] 5:59 PM

## 2023-07-04 NOTE — ED Notes (Signed)
Pt receiving blood unable to collect blood work at this time.

## 2023-07-04 NOTE — Progress Notes (Signed)
Heart Failure Navigator Progress Note  Assessed for Heart & Vascular TOC clinic readiness.  Patient does not meet criteria due to Advanced Heart Failure Team patient of Dr. Sabharwal.   Navigator will sign off at this time.    Jaking Thayer, BSN, RN Heart Failure Nurse Navigator Secure Chat Only   

## 2023-07-04 NOTE — Progress Notes (Addendum)
  Patient's nurse reporting that patient is very upset waiting in the ED room for 13 hours and she is very restless.  -During my evaluation at the bedside patient is crying and stating that she is tired of waiting in the ED.  Sepsis A-fib RVR History of paroxysmal atrial fibrillation Hypotension Anyhow during my evaluation I found out that is hypotensive MAP 63 and continues to have A-fib RVR heart rate 140 to 156 range.  O2 sat 97% on room air.  Respiratory to 18. -Per chart review patient has history of paroxysmal fibrillation not on anticoagulation due to decompensated cirrhosis associated esophageal varices.  Currently Cardizem and metoprolol is on hold in the setting of low blood pressure. -Elevated lactic acid.  Chest x-ray showing cardiomegaly with pulmonary vascular congestion especially edema. - Has been admitted for nausea, vomiting and hypotension. -Continue to AKI checking nuclear medicine scan to rule out PE. - In the ED patient got 500 mL of IV bolus.  Giving 1 L of LR bolus and continue LR 100 cc/h for 10 hours.  Also giving albumin 50 g 1 dose and starting midodrine 10 mg 3 times daily. - Giving amnio bolus followed by continue amnio drip for the management of  A-fib RVR.  Continue home digoxin 0.125 mg daily. -Given patient has pulmonary vascular congestion and giving fluid for hypotension high risk of development of hypoxia worsening pulmonary vascular congestion and volume overload.  Will closely monitor patient and if needed will give Lasix if patient's blood pressure can tolerate. -Blood cultures are in process - Checking lactic acid in 2 hours. - Currently patient is on ceftriaxone 1 g for abdominal cellulitis/pancolitis. As patient is hypotensive, tachycardic elevated lactic acid broadening antibiotic to cefepime and vancomycin. -Checking UA and sputum culture.   Acute on chronic anemia - Hemoglobin dropped 6.3 from 7.3. Patient does not have any active GI bleed -  Preparing to 2 units of blood and giving 1 unit of blood transfusion.  Continue to monitor H&H transfuse as needed to keep hemoglobin above 7.  Chronic thrombocytopenia - Platelet trending down 46-24. - Patient does not have any active GI bleed.  If platelet count less than 20 have to transfuse platelets.  CRITICAL CARE Performed by: Tereasa Coop, MD     Total critical care time: 35 minutes   Critical care time was exclusive of separately billable procedures and treating other patients.   Critical care was necessary to treat or prevent imminent or life-threatening deterioration.   Critical care was time spent personally by me on the following activities: development of treatment plan with patient and/or surrogate as well as nursing, discussions with consultants, evaluation of patient's response to treatment, examination of patient, obtaining history from patient or surrogate, ordering and performing treatments and interventions, ordering and review of laboratory studies, ordering and review of radiographic studies, pulse oximetry and re-evaluation of patient's condition.

## 2023-07-04 NOTE — ED Notes (Signed)
ED TO INPATIENT HANDOFF REPORT  ED Nurse Name and Phone #: Vernona Rieger [phone]  S Name/Age/Gender Hannah Khan 67 y.o. female Room/Bed: 045C/045C  Code Status   Code Status: Full Code  Home/SNF/Other Home Patient oriented to: self, place, time, and situation Is this baseline? Yes   Triage Complete: Triage complete  Chief Complaint Nausea and vomiting [R11.2] Nausea & vomiting [R11.2]  Triage Note Patient BIB EMS from home, Patient had stent in liver placed Friday. Generalized weakness with hypotension. 80/50, IVF came up 99/60. 20gLhand   Allergies Allergies  Allergen Reactions   Celebrex [Celecoxib] Other (See Comments)    "speech slurred" with celebrex   Shellfish Allergy Swelling and Rash    TINGLING AND SWELLING OF LIPS. LARGE WHELPS QUARTER-SIZE   Barium-Containing Compounds Other (See Comments)    TACHYCARDIA   Prednisone Other (See Comments)    TACHYCARDIA   Statins Other (See Comments)    AGGRAVATED FIBROMYALGIA   Fentanyl     Hallucinations    Ibuprofen Other (See Comments)    UNSPECIFIED SPECIFIC REACTION >> "DUE TO PLATELETS"   Sglt2 Inhibitors     Ozempic Severe gastroparesis    Tramadol     Hallucination    Tylenol [Acetaminophen] Other (See Comments)    UNSPECIFIED SPECIFIC REACTION >> "DUE TO PLATELETS"   Zetia [Ezetimibe]     Headache    Level of Care/Admitting Diagnosis ED Disposition     ED Disposition  Admit   Condition  --   Comment  Hospital Area: [facility] [[id number]]  Level of Care: Progressive [102]  Admit to Progressive based on following criteria: GI, ENDOCRINE disease patients with GI bleeding, acute liver failure or pancreatitis, stable with diabetic ketoacidosis or thyrotoxicosis (hypothyroid) state.  May admit patient to [facility] or [facility] if equivalent level of care is available:: No  Covid Evaluation: Asymptomatic - no recent exposure (last 10 days) testing not required  Diagnosis: Nausea &  vomiting [[id number]]  Admitting Physician: Tereasa Coop [[id number]]  Attending Physician: Tereasa Coop [[id number]]  Certification:: I certify this patient will need inpatient services for at least 2 midnights  Expected Medical Readiness: 07/10/2023          B Medical/Surgery History Past Medical History:  Diagnosis Date   Anemia    hx of   Aortic stenosis 02/2023   Echo 03/07/23 LVEF 55-60%, mild to moderate AS   Arthritis    knees   CHF (congestive heart failure) (HCC)    chronic diastolic   Diabetes mellitus without complication (HCC)    type 2   Dyspnea    Dysrhythmia    Fibromyalgia    Gastric varices    large   H/O transfusion of platelets    Heart murmur    never has caused any problems   Hx of colonic polyps    s/p partial colectomy   Hyperlipidemia    Hypertension    Joint pain    in knees and back spasms   Left leg swelling    wear compression hose   Liver cirrhosis secondary to NASH (nonalcoholic steatohepatitis) (HCC) dx nov 2014   had enlarged spleen also    Morbid obesity (HCC)    Persistent atrial fibrillation (HCC)    Pneumonia 10/2016   x 5   PONV (postoperative nausea and vomiting)    in past none recent   Portal hypertension (HCC)    Sleep apnea    uses cpap, setting varies between  14-20   Spleen enlarged    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 [facility])    Past Surgical History:  Procedure Laterality Date   BREAST LUMPECTOMY WITH RADIOACTIVE SEED LOCALIZATION Right 08/29/2017   Procedure: RIGHT BREAST LUMPECTOMY WITH RADIOACTIVE SEEDS LOCALIZATION;  Surgeon: Harriette Bouillon, MD;  Location: [facility] OR;  Service: General;  Laterality: Right;   CESAREAN SECTION  1989   CHOLECYSTECTOMY  20 yrs ago   COLECTOMY  2011   COLONOSCOPY     COLONOSCOPY N/A 09/05/2018   Procedure: COLONOSCOPY;  Surgeon: Willis Modena, MD;  Location: [facility];  Service: Endoscopy;  Laterality: N/A;   COLONOSCOPY WITH PROPOFOL Bilateral 05/04/2022   Procedure:  COLONOSCOPY WITH PROPOFOL;  Surgeon: Willis Modena, MD;  Location: [facility];  Service: Gastroenterology;  Laterality: Bilateral;   DILATATION & CURETTAGE/HYSTEROSCOPY WITH MYOSURE N/A 05/06/2016   Procedure: DILATATION & CURETTAGE/HYSTEROSCOPY WITH MYOSURE WITH POLYPECTOMY;  Surgeon: Myna Hidalgo, DO;  Location: [facility];  Service: Gynecology;  Laterality: N/A;   ESOPHAGOGASTRODUODENOSCOPY (EGD) WITH PROPOFOL N/A 09/04/2013   Procedure: ESOPHAGOGASTRODUODENOSCOPY (EGD) WITH PROPOFOL;  Surgeon: Willis Modena, MD;  [facility];  Service: Endoscopy;  Laterality: N/A;   ESOPHAGOGASTRODUODENOSCOPY (EGD) WITH PROPOFOL Bilateral 05/04/2022   Procedure: ESOPHAGOGASTRODUODENOSCOPY (EGD) WITH PROPOFOL;  Surgeon: Willis Modena, MD;  [facility];  Service: Gastroenterology;  Laterality: Bilateral;   IR EMBO ART  VEN HEMORR LYMPH EXTRAV  INC GUIDE ROADMAPPING  06/30/2023   IR INTRAVASCULAR ULTRASOUND NON CORONARY  12/23/2022   IR RADIOLOGIST EVAL & MGMT  09/21/2022   IR RADIOLOGIST EVAL & MGMT  10/06/2022   IR RADIOLOGIST EVAL & MGMT  11/02/2022   IR RADIOLOGIST EVAL & MGMT  01/31/2023   IR RADIOLOGIST EVAL & MGMT  05/31/2023   IR TIPS  12/23/2022   IR TIPS  06/30/2023   IR US GUIDE VASC ACCESS LEFT  06/30/2023   IR US GUIDE VASC ACCESS RIGHT  12/23/2022   IR US GUIDE VASC ACCESS RIGHT  06/30/2023   IR US GUIDE VASC ACCESS RIGHT  06/30/2023   KNEE ARTHROSCOPY  yrs ago   bilateral, one done x 1, one done twice   POLYPECTOMY  09/05/2018   Procedure: POLYPECTOMY;  Surgeon: Willis Modena, MD;  [facility];  Service: Endoscopy;;   RADIOLOGY WITH ANESTHESIA N/A 12/23/2022   Procedure: TIPS;  Surgeon: Bennie Dallas, MD;  [facility];  Service: Radiology;  Laterality: N/A;   RIGHT HEART CATH N/A 05/19/2023   Procedure: RIGHT HEART CATH;  Surgeon: Dorthula Nettles, DO;  [facility] INV[facility] CV LAB;  Service: Cardiovascular;  Laterality: N/A;   TIPS PROCEDURE N/A 06/30/2023    Procedure: TRANS-JUGULAR INTRAHEPATIC PORTAL SHUNT (TIPS);  Surgeon: Bennie Dallas, MD;  Location: [facility] OR;  Service: Radiology;  Laterality: N/A;     A IV Location/Drains/Wounds Patient Lines/Drains/Airways Status     Active Line/Drains/Airways     Name Placement date Placement time Site Days   Peripheral IV 07/03/23 20 G Posterior;Left Hand 07/03/23  1312  Hand  1   Peripheral IV 07/04/23 20 G Anterior;Right Forearm 07/04/23  0508  Forearm  less than 1   Wound / Incision (Open or Dehisced) 12/23/22 Puncture Neck Right Venous access for TIPS 12/23/22  1050  Neck  193   Wound / Incision (Open or Dehisced) 12/23/22 Puncture Groin Anterior;Proximal;Right Venous access for TIPS creation 12/23/22  1050  Groin  193            Intake/Output Last 24 hours  Intake/Output Summary (Last 24 hours) at 07/04/2023 1535 Last data filed at 07/04/2023 1534 Gross per 24 hour  Intake 569.11 ml  Output 800 ml  Net -230.89 ml    Labs/Imaging Results for orders placed or performed during the hospital encounter of 07/03/23 (from the past 48 hour(s))  CBC     Status: Abnormal   Collection Time: 07/03/23  1:07 PM  Result Value Ref Range   WBC 6.6 4.0 - 10.5 K/uL   RBC 2.17 (L) 3.87 - 5.11 MIL/uL   Hemoglobin 7.3 (L) 12.0 - 15.0 g/dL   HCT 16.1 (L) 09.6 - 04.5 %   MCV 100.9 (H) 80.0 - 100.0 fL   MCH 33.6 26.0 - 34.0 pg   MCHC 33.3 30.0 - 36.0 g/dL   RDW 40.9 (H) 81.1 - 91.4 %   Platelets 46 (L) 150 - 400 K/uL    Comment: Immature Platelet Fraction may be clinically indicated, consider ordering this additional test [id number] CONSISTENT WITH PREVIOUS RESULT REPEATED TO VERIFY    nRBC 1.5 (H) 0.0 - 0.2 %    Comment: Performed at [facility] Lab, [address], [place], [place] [address]  Basic metabolic panel     Status: Abnormal   Collection Time: 07/03/23  1:07 PM  Result Value Ref Range   Sodium 136 135 - 145 mmol/L   Potassium 3.6 3.5 - 5.1 mmol/L   Chloride 103 98 - 111 mmol/L    CO2 21 (L) 22 - 32 mmol/L   Glucose, Bld 205 (H) 70 - 99 mg/dL    Comment: Glucose reference range applies only to samples taken after fasting for at least 8 hours.   BUN 32 (H) 8 - 23 mg/dL   Creatinine, Ser 7.84 (H) 0.44 - 1.00 mg/dL   Calcium 8.4 (L) 8.9 - 10.3 mg/dL   GFR, Estimated 29 (L) >60 mL/min    Comment: (NOTE) Calculated using the CKD-EPI Creatinine Equation (2021)    Anion gap 12 5 - 15    Comment: Performed at [facility] Lab, [address] [place], [place] [address]  Troponin I (High Sensitivity)     Status: Abnormal   Collection Time: 07/03/23  1:07 PM  Result Value Ref Range   Troponin I (High Sensitivity) 41 (H) <18 ng/L    Comment: (NOTE) Elevated high sensitivity troponin I (hsTnI) values and significant  changes across serial measurements may suggest ACS but many other  chronic and acute conditions are known to elevate hsTnI results.  Refer to the "Links" section for chest pain algorithms and additional  guidance. Performed at [facility], [address] [place] [address], [place], [place] [address]   Hepatic function panel     Status: Abnormal   Collection Time: 07/03/23  3:00 PM  Result Value Ref Range   Total Protein 5.8 (L) 6.5 - 8.1 g/dL   Albumin 2.6 (L) 3.5 - 5.0 g/dL   AST 324 (H) 15 - 41 U/L   ALT 59 (H) 0 - 44 U/L   Alkaline Phosphatase 127 (H) 38 - 126 U/L   Total Bilirubin 5.2 (H) 0.3 - 1.2 mg/dL   Bilirubin, Direct 1.4 (H) 0.0 - 0.2 mg/dL   Indirect Bilirubin 3.8 (H) 0.3 - 0.9 mg/dL    Comment: Performed at [facility] Lab, [address], [place], [place] [address]  Protime-INR     Status: Abnormal   Collection Time: 07/03/23  3:00 PM  Result Value Ref Range   Prothrombin  Time 16.5 (H) 11.4 - 15.2 seconds   INR 1.3 (H) 0.8 - 1.2    Comment: (NOTE) INR goal varies based on device and disease states. Performed at [facility], [address], [place], [place] [address]   Lipase, blood     Status: None   Collection Time: 07/03/23   3:00 PM  Result Value Ref Range   Lipase 33 11 - 51 U/L    Comment: Performed at [facility], [address] [place], [place] [address]  Troponin I (High Sensitivity)     Status: Abnormal   Collection Time: 07/03/23  3:08 PM  Result Value Ref Range   Troponin I (High Sensitivity) 50 (H) <18 ng/L    Comment: (NOTE) Elevated high sensitivity troponin I (hsTnI) values and significant  changes across serial measurements may suggest ACS but many other  chronic and acute conditions are known to elevate hsTnI results.  Refer to the "Links" section for chest pain algorithms and additional  guidance. Performed at [facility] Lab, [address], [place], [place] [address]   I-Stat Lactic Acid     Status: None   Collection Time: 07/03/23 11:04 PM  Result Value Ref Range   Lactic Acid, Venous 1.6 0.5 - 1.9 mmol/L  CBG monitoring, ED     Status: Abnormal   Collection Time: 07/04/23 12:57 AM  Result Value Ref Range   Glucose-Capillary 187 (H) 70 - 99 mg/dL    Comment: Glucose reference range applies only to samples taken after fasting for at least 8 hours.  Hemoglobin A1c     Status: None   Collection Time: 07/04/23  1:57 AM  Result Value Ref Range   Hgb A1c MFr Bld 5.2 4.8 - 5.6 %    Comment: (NOTE) Pre diabetes:          5.7%-6.4%  Diabetes:              >6.4%  Glycemic control for   <7.0% adults with diabetes    Mean Plasma Glucose 102.54 mg/dL    Comment: Performed at [facility], [address] [place], [place] [address]  CBC     Status: Abnormal   Collection Time: 07/04/23  1:57 AM  Result Value Ref Range   WBC 4.3 4.0 - 10.5 K/uL   RBC 1.92 (L) 3.87 - 5.11 MIL/uL   Hemoglobin 6.3 (LL) 12.0 - 15.0 g/dL    Comment: REPEATED TO VERIFY THIS CRITICAL RESULT HAS VERIFIED AND BEEN CALLED TO M. BOWERSOCK,RN BY JEZREEL LAS IGAN ON 10 15 2024 AT 0245, AND HAS BEEN READ BACK.     HCT 19.8 (L) 36.0 - 46.0 %   MCV 103.1 (H) 80.0 - 100.0 fL   MCH 32.8 26.0 - 34.0 pg    MCHC 31.8 30.0 - 36.0 g/dL   RDW 13.2 (H) 44.0 - 10.2 %   Platelets 24 (LL) 150 - 400 K/uL    Comment: Immature Platelet Fraction may be clinically indicated, consider ordering this additional test [id number] CONSISTENT WITH PREVIOUS RESULT REPEATED TO VERIFY THIS CRITICAL RESULT HAS VERIFIED AND BEEN CALLED TO M. BOWERSOCK,RN BY JEZREEL LAS IGAN ON 10 15 2024 AT 0245, AND HAS BEEN READ BACK.     nRBC 0.7 (H) 0.0 - 0.2 %    Comment: Performed at [facility] Lab, [address] [place], [place] [address]  Comprehensive metabolic panel     Status: Abnormal   Collection Time: 07/04/23  1:57 AM  Result Value Ref Range   Sodium 135 135 - 145 mmol/L   Potassium 3.8 3.5 - 5.1 mmol/L   Chloride 103 98 - 111 mmol/L   CO2 22 22 - 32 mmol/L   Glucose, Bld 182 (H) 70 - 99 mg/dL    Comment: Glucose reference range applies only to samples taken after fasting for at least 8 hours.   BUN 34 (H) 8 - 23 mg/dL   Creatinine, Ser 1.61 (H) 0.44 - 1.00 mg/dL   Calcium 8.5 (L) 8.9 - 10.3 mg/dL   Total Protein 5.5 (L) 6.5 - 8.1 g/dL   Albumin 2.3 (L) 3.5 - 5.0 g/dL   AST 83 (H) 15 - 41 U/L   ALT 52 (H) 0 - 44 U/L   Alkaline Phosphatase 123 38 - 126 U/L   Total Bilirubin 5.2 (H) 0.3 - 1.2 mg/dL   GFR, Estimated 26 (L) >60 mL/min    Comment: (NOTE) Calculated using the CKD-EPI Creatinine Equation (2021)    Anion gap 10 5 - 15    Comment: Performed at [facility] Lab, [address] [place], [place] [address]  Type and screen [facility]     Status: None (Preliminary result)   Collection Time: 07/04/23  1:57 AM  Result Value Ref Range   ABO/RH(D) O POS    Antibody Screen NEG    Sample Expiration 07/07/2023,2359    Unit Number [id number]    Blood Component Type RED CELLS,LR    Unit division 00    Status of Unit ISSUED    Transfusion Status OK TO TRANSFUSE    Crossmatch Result COMPATIBLE    Unit Number [id number]    Blood Component Type RED CELLS,LR    Unit  division 00    Status of Unit ALLOCATED    Transfusion Status OK TO TRANSFUSE    Crossmatch Result COMPATIBLE   Troponin I (High Sensitivity)     Status: Abnormal   Collection Time: 07/04/23  1:57 AM  Result Value Ref Range   Troponin I (High Sensitivity) 38 (H) <18 ng/L    Comment: (NOTE) Elevated high sensitivity troponin I (hsTnI) values and significant  changes across serial measurements may suggest ACS but many other  chronic and acute conditions are known to elevate hsTnI results.  Refer to the "Links" section for chest pain algorithms and additional  guidance. Performed at [facility], [address], [place], [place] [address]   Brain natriuretic peptide     Status: Abnormal   Collection Time: 07/04/23  1:57 AM  Result Value Ref Range   B Natriuretic Peptide 531.6 (H) 0.0 - 100.0 pg/mL    Comment: Performed at [facility], [address], [place], [place] [address]  I-Stat Lactic Acid     Status: Abnormal   Collection Time: 07/04/23  2:13 AM  Result Value Ref Range   Lactic Acid, Venous 2.0 (HH) 0.5 - 1.9 mmol/L   Comment NOTIFIED PHYSICIAN   Prepare RBC (crossmatch)     Status: None   Collection Time: 07/04/23  4:06 AM  Result Value Ref Range   Order Confirmation      ORDER PROCESSED BY BLOOD BANK Performed at [facility], [address], [place], [place] [address]   CBG monitoring, ED     Status: Abnormal   Collection Time: 07/04/23  4:22 AM  Result Value Ref Range   Glucose-Capillary 155 (H) 70 - 99 mg/dL  Comment: Glucose reference range applies only to samples taken after fasting for at least 8 hours.  Urinalysis, Routine w reflex microscopic -Urine, Clean Catch     Status: Abnormal   Collection Time: 07/04/23  6:09 AM  Result Value Ref Range   Color, Urine AMBER (A) YELLOW    Comment: BIOCHEMICALS MAY BE AFFECTED BY COLOR   APPearance HAZY (A) CLEAR   Specific Gravity, Urine 1.013 1.005 - 1.030   pH 5.0 5.0 - 8.0   Glucose, UA  NEGATIVE NEGATIVE mg/dL   Hgb urine dipstick NEGATIVE NEGATIVE   Bilirubin Urine NEGATIVE NEGATIVE   Ketones, ur NEGATIVE NEGATIVE mg/dL   Protein, ur NEGATIVE NEGATIVE mg/dL   Nitrite NEGATIVE NEGATIVE   Leukocytes,Ua NEGATIVE NEGATIVE    Comment: Performed at [facility], [address] [place], [place] [address]  MRSA Next Gen by PCR, Nasal     Status: None   Collection Time: 07/04/23  6:10 AM   Specimen: Nasal Mucosa; Nasal Swab  Result Value Ref Range   MRSA by PCR Next Gen NOT DETECTED NOT DETECTED    Comment: (NOTE) The GeneXpert MRSA Assay (FDA approved for NASAL specimens only), is one component of a comprehensive MRSA colonization surveillance program. It is not intended to diagnose MRSA infection nor to guide or monitor treatment for MRSA infections. Test performance is not FDA approved in patients less than 29 years old. Performed at [facility] Lab, [address] [place], [place] [address]   Lactic acid, plasma     Status: None   Collection Time: 07/04/23  7:45 AM  Result Value Ref Range   Lactic Acid, Venous 1.4 0.5 - 1.9 mmol/L    Comment: Performed at [facility] Lab, [address], [place], [place] [address]  CBG monitoring, ED     Status: Abnormal   Collection Time: 07/04/23  7:56 AM  Result Value Ref Range   Glucose-Capillary 186 (H) 70 - 99 mg/dL    Comment: Glucose reference range applies only to samples taken after fasting for at least 8 hours.  Hemoglobin and hematocrit, blood     Status: Abnormal   Collection Time: 07/04/23 11:00 AM  Result Value Ref Range   Hemoglobin 7.5 (L) 12.0 - 15.0 g/dL   HCT 21.3 (L) 08.6 - 57.8 %    Comment: Performed at [facility], [address] [place], [place] [address]  CBG monitoring, ED     Status: Abnormal   Collection Time: 07/04/23 12:02 PM  Result Value Ref Range   Glucose-Capillary 161 (H) 70 - 99 mg/dL    Comment: Glucose reference range applies only to samples taken after fasting for at  least 8 hours.  APTT     Status: None   Collection Time: 07/04/23 12:56 PM  Result Value Ref Range   aPTT 28 24 - 36 seconds    Comment: Performed at [facility] Lab, [address] [place], [place] [address]  Protime-INR     Status: Abnormal   Collection Time: 07/04/23 12:56 PM  Result Value Ref Range   Prothrombin Time 16.9 (H) 11.4 - 15.2 seconds   INR 1.4 (H) 0.8 - 1.2    Comment: (NOTE) INR goal varies based on device and disease states. Performed at [facility], [address] [place], [place] [address]    DG Chest Port 1 View  Result Date: 07/04/2023 CLINICAL DATA:  Possible hypoxia EXAM: PORTABLE CHEST 1 VIEW  COMPARISON:  07/01/2023 FINDINGS: Cardiomegaly with vascular congestion and diffuse increased interstitial opacity suspicious for edema. Suspected airspace disease at the left base. Aortic atherosclerosis. IMPRESSION: Cardiomegaly with vascular congestion and interstitial edema increased compared to prior radiograph. Difficult to exclude left effusion or left basilar airspace disease Electronically Signed   By: Jasmine Pang M.D.   On: 07/04/2023 00:08   CT ABDOMEN PELVIS WO CONTRAST  Result Date: 07/03/2023 CLINICAL DATA:  Abdominal pain. Generalized weakness and hypotension. Hemoperitoneum EXAM: CT ABDOMEN AND PELVIS WITHOUT CONTRAST TECHNIQUE: Multidetector CT imaging of the abdomen and pelvis was performed following the standard protocol without IV contrast. RADIATION DOSE REDUCTION: This exam was performed according to the departmental dose-optimization program which includes automated exposure control, adjustment of the mA and/or kV according to patient size and/or use of iterative reconstruction technique. COMPARISON:  CT abdomen and pelvis without contrast 07/01/23 FINDINGS: Lower chest: Left greater than right pleural effusions are stable. Mild atelectasis is present at the left base. The heart is enlarged. Blood chambers are hypodense, consistent with  anemia. Hepatobiliary: Nodular irregularity is present within the liver. No discrete lesions are present. Hemoperitoneum along the anterior margin of the liver is decreased in size compared to the prior exam. Cholecystectomy noted. No significant intra or extrahepatic biliary dilation is present. Pancreas: Unremarkable. No pancreatic ductal dilatation or surrounding inflammatory changes. Spleen: Splenomegaly again noted. Adrenals/Urinary Tract: The adrenal glands are normal bilaterally. Bilateral simple cysts are stable. The ureters are within normal limits. The urinary bladder is mostly collapsed. Stomach/Bowel: The stomach and duodenum are within normal limits. Small bowel is unremarkable. Terminal ileum is normal. Appendectomy noted. Ascending and transverse colon within normal limits. Descending and sigmoid colon are normal. Vascular/Lymphatic: Scattered atherosclerotic calcifications are present within the aorta and branch vessels. No aneurysm is present. Cavernous transformation of the portal vein is again noted. Aneurysm coils are present. No significant adenopathy is present. Reproductive: IUD remains. Uterus and adnexa are otherwise within normal limits. Other: Hemoperitoneum extending into the pelvis is decreased compared to the prior exam. No new hemorrhage is present. Diffuse subcutaneous edema is present. No free air is present. Musculoskeletal: Mild degenerative changes are again noted in the thoracolumbar spine. No focal osseous lesions are present. Bony pelvis is normal. The hips are located and within normal limits bilaterally. IMPRESSION: 1. Decreased hemoperitoneum compared to the prior exam. 2. Stable left greater than right pleural effusions. 3. Stable cardiomegaly. 4. Anemia. 5. Stable splenomegaly. 6. Stable simple cysts of both kidneys. 7. Cavernous transformation of the portal vein. 8.  Aortic Atherosclerosis (ICD10-I70.0). Electronically Signed   By: Marin Roberts M.D.   On:  07/03/2023 21:59    Pending Labs Unresulted Labs (From admission, onward)     Start     Ordered   07/05/23 0500  Comprehensive metabolic panel  Daily,   R      07/04/23 0609   07/05/23 0500  CBC  Daily,   R      07/04/23 0609   07/04/23 1100  Hemoglobin and hematocrit, blood  3 times daily,   R (with TIMED occurrences)      07/04/23 0412   07/04/23 0409  Expectorated Sputum Assessment w Gram Stain, Rflx to Resp Cult  Once,   R       Question Answer Comment  Patient immune status Immunocompromised   Release to patient Immediate      07/04/23 0408   07/04/23 0012  Culture, blood (Routine X 2) w Reflex to  ID Panel  BLOOD CULTURE X 2,   R (with TIMED occurrences)      07/04/23 0011            Vitals/Pain Today's Vitals   07/04/23 1001 07/04/23 1055 07/04/23 1137 07/04/23 1525  BP:  (!) 103/57 (!) 102/55 (!) 113/53  Pulse:  92 93 (!) 107  Resp:  19 15 18   Temp:   98 F (36.7 C) 97.6 F (36.4 C)  TempSrc:   Oral   SpO2:  98% 99% 100%  PainSc: 0-No pain       Isolation Precautions No active isolations  Medications Medications  oxyCODONE (Oxy IR/ROXICODONE) immediate release tablet 5 mg (has no administration in time range)  naloxone [facility]) injection 0.4 mg (has no administration in time range)  rifaximin (XIFAXAN) tablet 550 mg (550 mg Oral Given 07/04/23 0925)  insulin aspart (novoLOG) injection 0-9 Units ( Subcutaneous Not Given 07/04/23 1249)  ondansetron (ZOFRAN) tablet 4 mg ( Oral See Alternative 07/04/23 1156)    Or  ondansetron (ZOFRAN) injection 4 mg (4 mg Intravenous Given 07/04/23 1156)  lactulose (CHRONULAC) 10 GM/15ML solution 20 g (20 g Oral Given 07/04/23 0925)  amiodarone (NEXTERONE PREMIX) 360-4.14 MG/200ML-% (1.8 mg/mL) IV infusion (0 mg/hr Intravenous Stopped 07/04/23 1534)    Followed by  amiodarone (NEXTERONE PREMIX) 360-4.14 MG/200ML-% (1.8 mg/mL) IV infusion (30 mg/hr Intravenous New Bag/Given 07/04/23 1316)  digoxin (LANOXIN) tablet 0.125 mg  (0.125 mg Oral Given 07/04/23 0721)  metoprolol tartrate (LOPRESSOR) tablet 12.5 mg (12.5 mg Oral Given 07/04/23 0926)  midodrine (PROAMATINE) tablet 20 mg (20 mg Oral Given 07/04/23 1315)  cephALEXin (KEFLEX) capsule 500 mg (500 mg Oral Given 07/04/23 0926)  0.9 %  sodium chloride infusion (0 mLs Intravenous Stopped 07/03/23 1405)  ondansetron (ZOFRAN) injection 4 mg (4 mg Intravenous Given 07/03/23 1352)  sodium chloride 0.9 % bolus 500 mL (0 mLs Intravenous Stopped 07/03/23 1605)  pantoprazole (PROTONIX) injection 40 mg (40 mg Intravenous Given 07/03/23 1453)  oxyCODONE (Oxy IR/ROXICODONE) immediate release tablet 5 mg (5 mg Oral Given 07/03/23 1454)  lactated ringers bolus 1,000 mL (0 mLs Intravenous Stopped 07/04/23 0731)  amiodarone (NEXTERONE) 1.8 mg/mL load via infusion 150 mg (150 mg Intravenous Bolus from Bag 07/04/23 0508)  albumin human 25 % solution 50 g (0 g Intravenous Stopped 07/04/23 1106)  0.9 %  sodium chloride infusion (Manually program via Guardrails IV Fluids) (0 mLs Intravenous Stopped 07/04/23 0735)  furosemide (LASIX) tablet 40 mg (40 mg Oral Given 07/04/23 0926)  technetium albumin aggregated (MAA) injection solution 4.4 millicurie (4.4 millicuries Intravenous Contrast Given 07/04/23 1405)    Mobility walks with device     Focused Assessments Cardiac Assessment Handoff:    Lab Results  Component Value Date   TROPONINI <0.03 04/26/2018   Lab Results  Component Value Date   DDIMER 2.64 (H) 03/16/2020   Does the Patient currently have chest pain? No    R Recommendations: See Admitting Provider Note  Report given to:   Additional Notes: Bedside commode

## 2023-07-04 NOTE — Progress Notes (Signed)
Referring Physician(s): DR Mahala Menghini  Supervising Physician: Marliss Coots  Patient Status:  [facility] - In-pt  Chief Complaint:  Abdominal pain Low blood pressure dizzy  Subjective:  NASH Cirrhosis, portal hypertension, gastroesophageal varices, multiple systemic portosystemic shunts and portal portal vein thrombus    Dr. Elby Showers discussed TIPS creation with possible  transsplenic portal vein recanalization, and variceal embolization including coil embolization of the gastrorenal shunt near the confluence with the left renal vein, and the SMV-->IVC varix, in addition to possibly providing sclerotherapy to the gastroesophageal varices. He had an in depth discussion with Mrs. Depriest regarding the risks of these procedure and she was interested in proceeding.  She presented to the [facility] Interventional Radiology department 06/30/23 for her procedure. Procedure was performed under general anesthesia with planned overnight hospitalization. TIPS creation was unsuccessful but Dr. Elby Showers was able to perform balloon occlusion retrograde transvenous obliteration of the gastric varix.   Discharged home 10/13 Stable labs Physical Therapy assessment complete comfortable with DC  Returned to ED 10/14: N/V; dizzy Hypotension Hg 7.3 and plt 46 (Now 6.3 and 24)  CT yesterday:    IMPRESSION: 1. Decreased hemoperitoneum compared to the prior exam. 2. Stable left greater than right pleural effusions. 3. Stable cardiomegaly. 4. Anemia. 5. Stable splenomegaly. 6. Stable simple cysts of both kidneys. 7. Cavernous transformation of the portal vein. 8.  Aortic Atherosclerosis (ICD10-I70.0).   Discussed with Dr Elby Showers  Allergies: Celebrex [celecoxib], Shellfish allergy, Barium-containing compounds, Prednisone, Statins, Fentanyl, Ibuprofen, Sglt2 inhibitors, Tramadol, Tylenol [acetaminophen], and Zetia [ezetimibe]  Medications: Prior to Admission medications   Medication Sig Start  Date End Date Taking? Authorizing Provider  albuterol (VENTOLIN HFA) 108 (90 Base) MCG/ACT inhaler Inhale 1-2 puffs into the lungs every 6 (six) hours as needed for wheezing or shortness of breath.   Yes [provider]  digoxin (LANOXIN) 0.125 MG tablet Take 1 tablet (0.125 mg total) by mouth daily. 04/28/23  Yes Sabharwal, Aditya, DO  diltiazem (CARDIZEM CD) 180 MG 24 hr capsule Take 1 capsule (180 mg total) by mouth daily. 04/18/23  Yes Alver Sorrow, NP  lactulose (CHRONULAC) 10 GM/15ML solution Take 20 g by mouth 3 (three) times daily. 03/17/21  Yes [provider]  Lidocaine HCl-Benzyl Alcohol (SALONPAS LIDOCAINE PLUS) 4-10 % CREA Apply 1 Application topically daily as needed (knee pain).   Yes [provider]  metolazone (ZAROXOLYN) 5 MG tablet Take 1 tablet (5 mg total) by mouth once a week. Take one tablet on Sundays 30 minutes before Bumex 02/28/23  Yes Alver Sorrow, NP  metoprolol tartrate (LOPRESSOR) 25 MG tablet Take 0.5 tablets (12.5 mg total) by mouth 2 (two) times daily. 05/08/23  Yes Milford, Anderson Malta, FNP  midodrine (PROAMATINE) 10 MG tablet Take 1 tablet (10 mg total) by mouth 3 (three) times daily. 04/18/23  Yes Alver Sorrow, NP  Multiple Vitamins-Minerals (PRESERVISION AREDS 2) CAPS Take 1 capsule by mouth 2 (two) times daily.   Yes [provider]  ondansetron (ZOFRAN) 4 MG tablet Take 1 tablet (4 mg total) by mouth every 8 (eight) hours as needed for up to 7 days for nausea or vomiting. 07/02/23 07/09/23 Yes Covington, Arman Filter, NP  oxycodone (OXY-IR) 5 MG capsule Take 1 capsule (5 mg total) by mouth every 4 (four) hours as needed for up to 5 days for pain. 07/02/23 07/07/23 Yes Covington, Arman Filter, NP  potassium chloride (MICRO-K) 10 MEQ CR capsule Take 10 capsules (100 mEq total) by  mouth 2 (two) times daily. Take an extra 4 tablets when taking Metolazone. 06/15/23  Yes Andrey Farmer, PA-C  Propylene Glycol (SYSTANE BALANCE) 0.6 %  SOLN Place 1 drop into both eyes 2 (two) times daily.   Yes [provider]  torsemide (DEMADEX) 20 MG tablet Take 4 tablets (80 mg total) by mouth every morning AND 3 tablets (60 mg total) every evening. 06/01/23  Yes Alen Bleacher, NP  XIFAXAN 550 MG TABS tablet Take 550 mg by mouth 2 (two) times daily. 03/13/21  Yes [provider]  doxycycline (ADOXA) 100 MG tablet Take 100 mg by mouth 2 (two) times daily. Per patient last dose will be Friday 06-30-23 Patient not taking: Reported on 07/03/2023    [provider]  Lancets [facility] DELICA PLUS [place]) MISC Apply topically 3 (three) times daily. 04/10/20   [provider]  [facility] VERIO test strip 1 each 3 (three) times daily. 04/13/20   [provider]     Vital Signs: BP (!) 103/57   Pulse 92   Temp 98.1 F (36.7 C) (Oral)   Resp 19   SpO2 98%   Physical Exam Vitals reviewed.  HENT:     Mouth/Throat:     Mouth: Mucous membranes are moist.  Cardiovascular:     Rate and Rhythm: Normal rate.  Pulmonary:     Breath sounds: Normal breath sounds.  Abdominal:     Palpations: Abdomen is soft.     Tenderness: There is abdominal tenderness.  Skin:    General: Skin is warm and dry.     Comments: Sites of PICC and procedure c/d/I; no bleeding  Neurological:     Mental Status: She is alert and oriented to person, place, and time.  Psychiatric:        Behavior: Behavior normal.     Imaging: DG Chest Port 1 View  Result Date: 07/04/2023 CLINICAL DATA:  Possible hypoxia EXAM: PORTABLE CHEST 1 VIEW COMPARISON:  07/01/2023 FINDINGS: Cardiomegaly with vascular congestion and diffuse increased interstitial opacity suspicious for edema. Suspected airspace disease at the left base. Aortic atherosclerosis. IMPRESSION: Cardiomegaly with vascular congestion and interstitial edema increased compared to prior radiograph. Difficult to exclude left effusion or left basilar airspace disease Electronically  Signed   By: Jasmine Pang M.D.   On: 07/04/2023 00:08   CT ABDOMEN PELVIS WO CONTRAST  Result Date: 07/03/2023 CLINICAL DATA:  Abdominal pain. Generalized weakness and hypotension. Hemoperitoneum EXAM: CT ABDOMEN AND PELVIS WITHOUT CONTRAST TECHNIQUE: Multidetector CT imaging of the abdomen and pelvis was performed following the standard protocol without IV contrast. RADIATION DOSE REDUCTION: This exam was performed according to the departmental dose-optimization program which includes automated exposure control, adjustment of the mA and/or kV according to patient size and/or use of iterative reconstruction technique. COMPARISON:  CT abdomen and pelvis without contrast 07/01/23 FINDINGS: Lower chest: Left greater than right pleural effusions are stable. Mild atelectasis is present at the left base. The heart is enlarged. Blood chambers are hypodense, consistent with anemia. Hepatobiliary: Nodular irregularity is present within the liver. No discrete lesions are present. Hemoperitoneum along the anterior margin of the liver is decreased in size compared to the prior exam. Cholecystectomy noted. No significant intra or extrahepatic biliary dilation is present. Pancreas: Unremarkable. No pancreatic ductal dilatation or surrounding inflammatory changes. Spleen: Splenomegaly again noted. Adrenals/Urinary Tract: The adrenal glands are normal bilaterally. Bilateral simple cysts are stable. The ureters are within normal limits. The urinary bladder is mostly collapsed.  Stomach/Bowel: The stomach and duodenum are within normal limits. Small bowel is unremarkable. Terminal ileum is normal. Appendectomy noted. Ascending and transverse colon within normal limits. Descending and sigmoid colon are normal. Vascular/Lymphatic: Scattered atherosclerotic calcifications are present within the aorta and branch vessels. No aneurysm is present. Cavernous transformation of the portal vein is again noted. Aneurysm coils are present.  No significant adenopathy is present. Reproductive: IUD remains. Uterus and adnexa are otherwise within normal limits. Other: Hemoperitoneum extending into the pelvis is decreased compared to the prior exam. No new hemorrhage is present. Diffuse subcutaneous edema is present. No free air is present. Musculoskeletal: Mild degenerative changes are again noted in the thoracolumbar spine. No focal osseous lesions are present. Bony pelvis is normal. The hips are located and within normal limits bilaterally. IMPRESSION: 1. Decreased hemoperitoneum compared to the prior exam. 2. Stable left greater than right pleural effusions. 3. Stable cardiomegaly. 4. Anemia. 5. Stable splenomegaly. 6. Stable simple cysts of both kidneys. 7. Cavernous transformation of the portal vein. 8.  Aortic Atherosclerosis (ICD10-I70.0). Electronically Signed   By: Marin Roberts M.D.   On: 07/03/2023 21:59   CT ABDOMEN PELVIS WO CONTRAST  Result Date: 07/01/2023 CLINICAL DATA:  Postop abdominal pain. EXAM: CT ABDOMEN AND PELVIS WITHOUT CONTRAST TECHNIQUE: Multidetector CT imaging of the abdomen and pelvis was performed following the standard protocol without IV contrast. RADIATION DOSE REDUCTION: This exam was performed according to the departmental dose-optimization program which includes automated exposure control, adjustment of the mA and/or kV according to patient size and/or use of iterative reconstruction technique. COMPARISON:  04/06/2023 FINDINGS: Lower chest: Small left effusion. Hepatobiliary: Morphologic features of the liver compatible with cirrhosis identified. This is similar to the previous exam. Isodense fluid extending over the right lobe of liver is identified. Status post cholecystectomy. Unchanged increase caliber of the CBD. Pancreas: Unremarkable. No pancreatic ductal dilatation or surrounding inflammatory changes. Spleen: Splenomegaly, unchanged. There is a small amount of isodense fluid which extends around the  posterior and inferior spleen, image 47/4. Adrenals/Urinary Tract: Normal appearance of the adrenal glands. Bilateral kidney cysts are stable compared with the previous X study. No follow-up imaging recommended. No signs of obstructive uropathy. Urinary bladder is partially decompressed containing contrast material from previous imaging. Stomach/Bowel: Stomach appears normal. No pathologic dilatation of the large or small bowel loops. Signs of previous right hemicolectomy. No bowel wall thickening or inflammation. Vascular/Lymphatic: Aortic atherosclerosis. No aneurysm. Edema identified within the small bowel mesentery. Nonspecific in the setting of cirrhosis with portal venous hypertension. Similar appearance of esophageal and gastric varices. Increased by collateral vessel formation within the porta hepatic region compatible with chronic portal vein occlusion with cavernous transformation. No signs of abdominopelvic adenopathy. Reproductive: IUD identified within the uterus. There is no adnexal mass. Other: Intermediate density fluid is noted extending along bilateral pericolic gutters to the peritoneal reflections of the lower abdomen/upper pelvis. Measured in the left lower quadrant of the abdomen fluid measures 39 Hounsfield units. I would consider this a small to moderate volume of hemoperitoneum. No focal fluid collections identified. Visualized osseous structures are unremarkable. Musculoskeletal: No acute or significant osseous findings. IMPRESSION: 1. Interval development of small to moderate volume of hemoperitoneum within the abdomen and pelvis. This extends over the liver, spleen, along the pericolic gutters and peritoneal reflections of the lower abdomen and pelvis. 2. Cirrhosis with portal venous hypertension. Similar appearance of esophageal and gastric varices. 3. Chronic portal vein occlusion with cavernous transformation. 4. Small left pleural effusion. 5.  Aortic Atherosclerosis (ICD10-I70.0).  Critical Value/emergent results were called by telephone at the time of interpretation on 07/01/2023 at 11:12 am to provider [facility] , who verbally acknowledged these results. Electronically Signed   By: Signa Kell M.D.   On: 07/01/2023 11:14   DG Chest 1 View  Result Date: 07/01/2023 CLINICAL DATA:  Shortness of breath. EXAM: CHEST  1 VIEW COMPARISON:  One-view chest x-ray 03/09/2023 FINDINGS: Heart is enlarged. Mild pulmonary vascular congestion is present without frank edema. No focal airspace disease is present. The visualized soft tissues and bony thorax are unremarkable. IMPRESSION: Cardiomegaly and mild pulmonary vascular congestion without frank edema. Electronically Signed   By: Marin Roberts M.D.   On: 07/01/2023 10:10   IR Tips  Result Date: 06/30/2023 CLINICAL DATA:  67 year old female with history of NASH cirrhosis (Child Pugh B, MELD 20), portal hypertension, and gastroesophageal varices without history of hemorrhage. 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 She has very large gastroesophageal varices. 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 EXAM: 1. Ultrasound guided vascular access of the right basilic vein 2. Peripherally inserted central catheter placement 3. Ultrasound-guided access of the right internal jugular vein 4. Ultrasound-guided access of the right greater saphenous vein 5. Hepatic venogram 6. Intravascular ultrasound 7. Central venous manometery 8. Coil assisted retrograde transvenous obliteration of gastrorenal varices MEDICATIONS: As antibiotic prophylaxis, Rocephin 2 gm IV was ordered pre-procedure and administered intravenously within one hour of incision. ANESTHESIA/SEDATION: General - as administered by the Anesthesia department CONTRAST:  One hundred  sixty ML Omnipaque 300, intravenous FLUOROSCOPY TIME:  Fluoroscopy Time: 40 minutes (2,117 mGy). COMPLICATIONS: None immediate. PROCEDURE: Informed written consent was obtained from the patient after a thorough discussion of the procedural risks, benefits and alternatives. All questions were addressed. Maximal Sterile Barrier Technique was utilized including caps, mask, sterile gowns, sterile gloves, sterile drape, hand hygiene and skin antiseptic. A timeout was performed prior to the initiation of the procedure. Preprocedure ultrasound evaluation of the right upper extremity demonstrated a patent basilic vein. The procedure was planned. Subdermal Local anesthesia was provided at the planned needle entry site with 1% lidocaine. A small skin nick was made. Under direct ultrasound visualization, the right basilic vein was punctured with a 21 gauge micropuncture needle. A permanent ultrasound image was captured and stored in the record. A guidewire was inserted over which a 5 French peel-away sheath was placed. The wire and inner portion of the sheath were removed and a 38 cm, dual lumen 5 French peripherally inserted central catheter were then placed. Catheter tip terminates near the cavoatrial junction. Both lumens flushed and aspirated with ease. A sterile bandage was applied. This venous access was used by the department of anesthesia throughout the procedure. A preliminary ultrasound of the right groin was performed and demonstrates a patent right common femoral vein. A permanent ultrasound image was recorded. Using a combination of fluoroscopy and ultrasound, an access site was determined. A small dermatotomy was made at the planned puncture site. Using ultrasound guidance, access into the right common femoral vein was obtained with visualization of needle entry into the vessel using a standard micropuncture technique. A wire was advanced into the IVC insert all fascial dilation performed. An 8 [place], 11 cm  vascular sheath was placed into the external iliac vein. Through this access site, an 8 [place]  ICE catheter was advanced with ease under fluoroscopic guidance to the level of the intrahepatic inferior vena cava. A preliminary ultrasound of the right neck was performed and demonstrates a patent internal jugular vein. A permanent ultrasound image was recorded. Using a combination of fluoroscopy and ultrasound, an access site was determined. A small dermatotomy was made at the planned puncture site. Using ultrasound guidance, access into the right internal jugular vein was obtained with visualization of needle entry into the vessel using a standard micropuncture technique. A wire was advanced into the IVC and serial fascial dilation performed. A 10 French tips sheath was placed into the internal jugular vein and advanced to the IVC. The jugular sheath was retracted into the right atrium and manometry was performed and measured to be a mean of 14 mm Hg. A 5 French angled tip catheter was then directed into the left renal vein. Left renal venogram was performed which demonstrated patency and patent inflow from a large gastro renal shunt. The gastro renal shunt was then selected. A balloon occlusion catheter was inserted and inflated to occlude the outflow. Dedicated variceal venogram was then performed which demonstrated an isolated gastric varix with no significant evidence of venous outflow to the nearby esophageal varices or portal venous system. Transvenous obliteration was then performed with approximately 6 mL of a solution of Gel-Foam slurry, air, 3% sotradecol, and lipiodol. The occlusion balloon catheter was then flushed and coil embolization was performed at the inferior aspect of the gastro renal shunt with an assortment of 0.035" detachable Azur coils. The Fogarty catheter was then deflated and removed. Repeat left renal venogram demonstrated no evidence of reflux into the occluded gastro renal shunt.  A 5 French angled tip catheter was then directed into the middle hepatic vein. Hepatic venogram was performed. These images demonstrated patent hepatic vein with no stenosis. The catheter was advanced to a wedge portion of the a patent vein over which the 10 French sheath was advanced into the middle hepatic vein. Using ICE ultrasound visualization the catheter as middle hepatic vein as well as the portal anatomy was defined. A planned exit site from the hepatic vein and puncture site from the suspected portal vein was placed into a single sonographic plane. Under direct ultrasound visualization, the ScorpionX needle was advanced into the suspected occluded portal vein. This was unsuccessful in cannulation. The needle set was removed. The left flank was then prepped and draped in standard fashion. Ultrasound demonstrated an enlarged spleen with a patent straight appearing vein near the splenic hilum which appeared appropriate for puncture. The under direct ultrasound visualization, the splenic vein was attempted to be cannulated with a 21 gauge Chiba needle making a proximally 6 separate passes, ultimately failing to gain access to the splenic vein. Completion ultrasound demonstrated no evidence of surrounding hematoma or other complicating features. Given failure of attempted trans splenic portal venous recanalization, additional attempt at transjugular transhepatic portosystemic shunt creation was attempted with ICE in fluoroscopic guidance however from the right portal vein. A single pass was attempted at what appears to be in a track portal target in the right lobe of the liver, however there is inability to advance a wire into the structure. The needle set was removed once again. Next, attempted cannulating the large SMV to IVC ectopic varix in the right lower quadrant was attempted. Balloon occlusion venogram was performed which demonstrated primarily right gonadal vein opacification. There is no evidence of  portal opacification for possible image guided targeting. At this  point, the procedure was terminated. The catheters and sheath were removed and manual compression was applied to the right internal jugular and right greater saphenous venous access sites until hemostasis was achieved. The patient was transferred to the PACU in stable condition. IMPRESSION: 1. Technically successful coil assisted retrograde transvenous obliteration of isolated large gastrorenal variceal shunt. 2. Technically unsuccessful transjugular intrahepatic portosystemic shunt creation and attempted transsplenic portal vein recanalization. 3. Technically successful right upper extremity image guided peripherally inserted central catheter placement. Marliss Coots, MD [facility] Radiology Electronically Signed   By: Marliss Coots M.D.   On: 06/30/2023 21:46   IR PICC PLACEMENT LEFT <5 YRS INC IMG GUIDE  Result Date: 06/30/2023 CLINICAL DATA:  67 year old female with history of NASH cirrhosis (Child Pugh B, MELD 20), portal hypertension, and gastroesophageal varices without history of hemorrhage. 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 She has very large gastroesophageal varices. 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 EXAM: 1. Ultrasound guided vascular access of the right basilic vein 2. Peripherally inserted central catheter placement 3. Ultrasound-guided access of the right internal jugular vein 4. Ultrasound-guided access of the right greater saphenous vein 5. Hepatic venogram 6. Intravascular ultrasound 7. Central venous manometery 8. Coil assisted retrograde transvenous obliteration of gastrorenal varices MEDICATIONS: As antibiotic prophylaxis, Rocephin 2 gm IV was ordered  pre-procedure and administered intravenously within one hour of incision. ANESTHESIA/SEDATION: General - as administered by the Anesthesia department CONTRAST:  One hundred sixty ML Omnipaque 300, intravenous FLUOROSCOPY TIME:  Fluoroscopy Time: 40 minutes (2,117 mGy). COMPLICATIONS: None immediate. PROCEDURE: Informed written consent was obtained from the patient after a thorough discussion of the procedural risks, benefits and alternatives. All questions were addressed. Maximal Sterile Barrier Technique was utilized including caps, mask, sterile gowns, sterile gloves, sterile drape, hand hygiene and skin antiseptic. A timeout was performed prior to the initiation of the procedure. Preprocedure ultrasound evaluation of the right upper extremity demonstrated a patent basilic vein. The procedure was planned. Subdermal Local anesthesia was provided at the planned needle entry site with 1% lidocaine. A small skin nick was made. Under direct ultrasound visualization, the right basilic vein was punctured with a 21 gauge micropuncture needle. A permanent ultrasound image was captured and stored in the record. A guidewire was inserted over which a 5 French peel-away sheath was placed. The wire and inner portion of the sheath were removed and a 38 cm, dual lumen 5 French peripherally inserted central catheter were then placed. Catheter tip terminates near the cavoatrial junction. Both lumens flushed and aspirated with ease. A sterile bandage was applied. This venous access was used by the department of anesthesia throughout the procedure. A preliminary ultrasound of the right groin was performed and demonstrates a patent right common femoral vein. A permanent ultrasound image was recorded. Using a combination of fluoroscopy and ultrasound, an access site was determined. A small dermatotomy was made at the planned puncture site. Using ultrasound guidance, access into the right common femoral vein was obtained with  visualization of needle entry into the vessel using a standard micropuncture technique. A wire was advanced into the IVC insert all fascial dilation performed. An 8 [place], 11 cm vascular sheath was placed into the external iliac vein. Through this access site, an 99 [place] ICE catheter was advanced with ease under fluoroscopic guidance  to the level of the intrahepatic inferior vena cava. A preliminary ultrasound of the right neck was performed and demonstrates a patent internal jugular vein. A permanent ultrasound image was recorded. Using a combination of fluoroscopy and ultrasound, an access site was determined. A small dermatotomy was made at the planned puncture site. Using ultrasound guidance, access into the right internal jugular vein was obtained with visualization of needle entry into the vessel using a standard micropuncture technique. A wire was advanced into the IVC and serial fascial dilation performed. A 10 French tips sheath was placed into the internal jugular vein and advanced to the IVC. The jugular sheath was retracted into the right atrium and manometry was performed and measured to be a mean of 14 mm Hg. A 5 French angled tip catheter was then directed into the left renal vein. Left renal venogram was performed which demonstrated patency and patent inflow from a large gastro renal shunt. The gastro renal shunt was then selected. A balloon occlusion catheter was inserted and inflated to occlude the outflow. Dedicated variceal venogram was then performed which demonstrated an isolated gastric varix with no significant evidence of venous outflow to the nearby esophageal varices or portal venous system. Transvenous obliteration was then performed with approximately 6 mL of a solution of Gel-Foam slurry, air, 3% sotradecol, and lipiodol. The occlusion balloon catheter was then flushed and coil embolization was performed at the inferior aspect of the gastro renal shunt with an assortment of  0.035" detachable Azur coils. The Fogarty catheter was then deflated and removed. Repeat left renal venogram demonstrated no evidence of reflux into the occluded gastro renal shunt. A 5 French angled tip catheter was then directed into the middle hepatic vein. Hepatic venogram was performed. These images demonstrated patent hepatic vein with no stenosis. The catheter was advanced to a wedge portion of the a patent vein over which the 10 French sheath was advanced into the middle hepatic vein. Using ICE ultrasound visualization the catheter as middle hepatic vein as well as the portal anatomy was defined. A planned exit site from the hepatic vein and puncture site from the suspected portal vein was placed into a single sonographic plane. Under direct ultrasound visualization, the ScorpionX needle was advanced into the suspected occluded portal vein. This was unsuccessful in cannulation. The needle set was removed. The left flank was then prepped and draped in standard fashion. Ultrasound demonstrated an enlarged spleen with a patent straight appearing vein near the splenic hilum which appeared appropriate for puncture. The under direct ultrasound visualization, the splenic vein was attempted to be cannulated with a 21 gauge Chiba needle making a proximally 6 separate passes, ultimately failing to gain access to the splenic vein. Completion ultrasound demonstrated no evidence of surrounding hematoma or other complicating features. Given failure of attempted trans splenic portal venous recanalization, additional attempt at transjugular transhepatic portosystemic shunt creation was attempted with ICE in fluoroscopic guidance however from the right portal vein. A single pass was attempted at what appears to be in a track portal target in the right lobe of the liver, however there is inability to advance a wire into the structure. The needle set was removed once again. Next, attempted cannulating the large SMV to IVC  ectopic varix in the right lower quadrant was attempted. Balloon occlusion venogram was performed which demonstrated primarily right gonadal vein opacification. There is no evidence of portal opacification for possible image guided targeting. At this point, the procedure was terminated. The catheters and sheath  were removed and manual compression was applied to the right internal jugular and right greater saphenous venous access sites until hemostasis was achieved. The patient was transferred to the PACU in stable condition. IMPRESSION: 1. Technically successful coil assisted retrograde transvenous obliteration of isolated large gastrorenal variceal shunt. 2. Technically unsuccessful transjugular intrahepatic portosystemic shunt creation and attempted transsplenic portal vein recanalization. 3. Technically successful right upper extremity image guided peripherally inserted central catheter placement. Marliss Coots, MD [facility] Radiology Electronically Signed   By: Marliss Coots M.D.   On: 06/30/2023 21:46   IR US Guide Vasc Access Right  Result Date: 06/30/2023 CLINICAL DATA:  67 year old female with history of NASH cirrhosis (Child Pugh B, MELD 20), portal hypertension, and gastroesophageal varices without history of hemorrhage. 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 She has very large gastroesophageal varices. 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 EXAM: 1. Ultrasound guided vascular access of the right basilic vein 2. Peripherally inserted central catheter placement 3. Ultrasound-guided access of the right internal jugular vein 4. Ultrasound-guided access of the right greater saphenous vein 5. Hepatic venogram 6. Intravascular ultrasound 7. Central  venous manometery 8. Coil assisted retrograde transvenous obliteration of gastrorenal varices MEDICATIONS: As antibiotic prophylaxis, Rocephin 2 gm IV was ordered pre-procedure and administered intravenously within one hour of incision. ANESTHESIA/SEDATION: General - as administered by the Anesthesia department CONTRAST:  One hundred sixty ML Omnipaque 300, intravenous FLUOROSCOPY TIME:  Fluoroscopy Time: 40 minutes (2,117 mGy). COMPLICATIONS: None immediate. PROCEDURE: Informed written consent was obtained from the patient after a thorough discussion of the procedural risks, benefits and alternatives. All questions were addressed. Maximal Sterile Barrier Technique was utilized including caps, mask, sterile gowns, sterile gloves, sterile drape, hand hygiene and skin antiseptic. A timeout was performed prior to the initiation of the procedure. Preprocedure ultrasound evaluation of the right upper extremity demonstrated a patent basilic vein. The procedure was planned. Subdermal Local anesthesia was provided at the planned needle entry site with 1% lidocaine. A small skin nick was made. Under direct ultrasound visualization, the right basilic vein was punctured with a 21 gauge micropuncture needle. A permanent ultrasound image was captured and stored in the record. A guidewire was inserted over which a 5 French peel-away sheath was placed. The wire and inner portion of the sheath were removed and a 38 cm, dual lumen 5 French peripherally inserted central catheter were then placed. Catheter tip terminates near the cavoatrial junction. Both lumens flushed and aspirated with ease. A sterile bandage was applied. This venous access was used by the department of anesthesia throughout the procedure. A preliminary ultrasound of the right groin was performed and demonstrates a patent right common femoral vein. A permanent ultrasound image was recorded. Using a combination of fluoroscopy and ultrasound, an access site was  determined. A small dermatotomy was made at the planned puncture site. Using ultrasound guidance, access into the right common femoral vein was obtained with visualization of needle entry into the vessel using a standard micropuncture technique. A wire was advanced into the IVC insert all fascial dilation performed. An 8 [place], 11 cm vascular sheath was placed into the external iliac vein. Through this access site, an 54 [place] ICE catheter was advanced with ease under fluoroscopic guidance to the level of the intrahepatic inferior vena cava. A preliminary ultrasound  of the right neck was performed and demonstrates a patent internal jugular vein. A permanent ultrasound image was recorded. Using a combination of fluoroscopy and ultrasound, an access site was determined. A small dermatotomy was made at the planned puncture site. Using ultrasound guidance, access into the right internal jugular vein was obtained with visualization of needle entry into the vessel using a standard micropuncture technique. A wire was advanced into the IVC and serial fascial dilation performed. A 10 French tips sheath was placed into the internal jugular vein and advanced to the IVC. The jugular sheath was retracted into the right atrium and manometry was performed and measured to be a mean of 14 mm Hg. A 5 French angled tip catheter was then directed into the left renal vein. Left renal venogram was performed which demonstrated patency and patent inflow from a large gastro renal shunt. The gastro renal shunt was then selected. A balloon occlusion catheter was inserted and inflated to occlude the outflow. Dedicated variceal venogram was then performed which demonstrated an isolated gastric varix with no significant evidence of venous outflow to the nearby esophageal varices or portal venous system. Transvenous obliteration was then performed with approximately 6 mL of a solution of Gel-Foam slurry, air, 3% sotradecol, and lipiodol.  The occlusion balloon catheter was then flushed and coil embolization was performed at the inferior aspect of the gastro renal shunt with an assortment of 0.035" detachable Azur coils. The Fogarty catheter was then deflated and removed. Repeat left renal venogram demonstrated no evidence of reflux into the occluded gastro renal shunt. A 5 French angled tip catheter was then directed into the middle hepatic vein. Hepatic venogram was performed. These images demonstrated patent hepatic vein with no stenosis. The catheter was advanced to a wedge portion of the a patent vein over which the 10 French sheath was advanced into the middle hepatic vein. Using ICE ultrasound visualization the catheter as middle hepatic vein as well as the portal anatomy was defined. A planned exit site from the hepatic vein and puncture site from the suspected portal vein was placed into a single sonographic plane. Under direct ultrasound visualization, the ScorpionX needle was advanced into the suspected occluded portal vein. This was unsuccessful in cannulation. The needle set was removed. The left flank was then prepped and draped in standard fashion. Ultrasound demonstrated an enlarged spleen with a patent straight appearing vein near the splenic hilum which appeared appropriate for puncture. The under direct ultrasound visualization, the splenic vein was attempted to be cannulated with a 21 gauge Chiba needle making a proximally 6 separate passes, ultimately failing to gain access to the splenic vein. Completion ultrasound demonstrated no evidence of surrounding hematoma or other complicating features. Given failure of attempted trans splenic portal venous recanalization, additional attempt at transjugular transhepatic portosystemic shunt creation was attempted with ICE in fluoroscopic guidance however from the right portal vein. A single pass was attempted at what appears to be in a track portal target in the right lobe of the liver,  however there is inability to advance a wire into the structure. The needle set was removed once again. Next, attempted cannulating the large SMV to IVC ectopic varix in the right lower quadrant was attempted. Balloon occlusion venogram was performed which demonstrated primarily right gonadal vein opacification. There is no evidence of portal opacification for possible image guided targeting. At this point, the procedure was terminated. The catheters and sheath were removed and manual compression was applied to the right internal jugular  and right greater saphenous venous access sites until hemostasis was achieved. The patient was transferred to the PACU in stable condition. IMPRESSION: 1. Technically successful coil assisted retrograde transvenous obliteration of isolated large gastrorenal variceal shunt. 2. Technically unsuccessful transjugular intrahepatic portosystemic shunt creation and attempted transsplenic portal vein recanalization. 3. Technically successful right upper extremity image guided peripherally inserted central catheter placement. Marliss Coots, MD [facility] Radiology Electronically Signed   By: Marliss Coots M.D.   On: 06/30/2023 21:46   IR US Guide Vasc Access Right  Result Date: 06/30/2023 CLINICAL DATA:  67 year old female with history of NASH cirrhosis (Child Pugh B, MELD 20), portal hypertension, and gastroesophageal varices without history of hemorrhage. 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 She has very large gastroesophageal varices. 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 EXAM: 1. Ultrasound guided vascular access of the right basilic vein 2. Peripherally inserted central catheter placement 3. Ultrasound-guided access of the  right internal jugular vein 4. Ultrasound-guided access of the right greater saphenous vein 5. Hepatic venogram 6. Intravascular ultrasound 7. Central venous manometery 8. Coil assisted retrograde transvenous obliteration of gastrorenal varices MEDICATIONS: As antibiotic prophylaxis, Rocephin 2 gm IV was ordered pre-procedure and administered intravenously within one hour of incision. ANESTHESIA/SEDATION: General - as administered by the Anesthesia department CONTRAST:  One hundred sixty ML Omnipaque 300, intravenous FLUOROSCOPY TIME:  Fluoroscopy Time: 40 minutes (2,117 mGy). COMPLICATIONS: None immediate. PROCEDURE: Informed written consent was obtained from the patient after a thorough discussion of the procedural risks, benefits and alternatives. All questions were addressed. Maximal Sterile Barrier Technique was utilized including caps, mask, sterile gowns, sterile gloves, sterile drape, hand hygiene and skin antiseptic. A timeout was performed prior to the initiation of the procedure. Preprocedure ultrasound evaluation of the right upper extremity demonstrated a patent basilic vein. The procedure was planned. Subdermal Local anesthesia was provided at the planned needle entry site with 1% lidocaine. A small skin nick was made. Under direct ultrasound visualization, the right basilic vein was punctured with a 21 gauge micropuncture needle. A permanent ultrasound image was captured and stored in the record. A guidewire was inserted over which a 5 French peel-away sheath was placed. The wire and inner portion of the sheath were removed and a 38 cm, dual lumen 5 French peripherally inserted central catheter were then placed. Catheter tip terminates near the cavoatrial junction. Both lumens flushed and aspirated with ease. A sterile bandage was applied. This venous access was used by the department of anesthesia throughout the procedure. A preliminary ultrasound of the right groin was performed and demonstrates a  patent right common femoral vein. A permanent ultrasound image was recorded. Using a combination of fluoroscopy and ultrasound, an access site was determined. A small dermatotomy was made at the planned puncture site. Using ultrasound guidance, access into the right common femoral vein was obtained with visualization of needle entry into the vessel using a standard micropuncture technique. A wire was advanced into the IVC insert all fascial dilation performed. An 8 [place], 11 cm vascular sheath was placed into the external iliac vein. Through this access site, an 30 [place] ICE catheter was advanced with ease under fluoroscopic guidance to the level of the intrahepatic inferior vena cava. A preliminary ultrasound of the right neck was performed and demonstrates a patent internal jugular  vein. A permanent ultrasound image was recorded. Using a combination of fluoroscopy and ultrasound, an access site was determined. A small dermatotomy was made at the planned puncture site. Using ultrasound guidance, access into the right internal jugular vein was obtained with visualization of needle entry into the vessel using a standard micropuncture technique. A wire was advanced into the IVC and serial fascial dilation performed. A 10 French tips sheath was placed into the internal jugular vein and advanced to the IVC. The jugular sheath was retracted into the right atrium and manometry was performed and measured to be a mean of 14 mm Hg. A 5 French angled tip catheter was then directed into the left renal vein. Left renal venogram was performed which demonstrated patency and patent inflow from a large gastro renal shunt. The gastro renal shunt was then selected. A balloon occlusion catheter was inserted and inflated to occlude the outflow. Dedicated variceal venogram was then performed which demonstrated an isolated gastric varix with no significant evidence of venous outflow to the nearby esophageal varices or portal  venous system. Transvenous obliteration was then performed with approximately 6 mL of a solution of Gel-Foam slurry, air, 3% sotradecol, and lipiodol. The occlusion balloon catheter was then flushed and coil embolization was performed at the inferior aspect of the gastro renal shunt with an assortment of 0.035" detachable Azur coils. The Fogarty catheter was then deflated and removed. Repeat left renal venogram demonstrated no evidence of reflux into the occluded gastro renal shunt. A 5 French angled tip catheter was then directed into the middle hepatic vein. Hepatic venogram was performed. These images demonstrated patent hepatic vein with no stenosis. The catheter was advanced to a wedge portion of the a patent vein over which the 10 French sheath was advanced into the middle hepatic vein. Using ICE ultrasound visualization the catheter as middle hepatic vein as well as the portal anatomy was defined. A planned exit site from the hepatic vein and puncture site from the suspected portal vein was placed into a single sonographic plane. Under direct ultrasound visualization, the ScorpionX needle was advanced into the suspected occluded portal vein. This was unsuccessful in cannulation. The needle set was removed. The left flank was then prepped and draped in standard fashion. Ultrasound demonstrated an enlarged spleen with a patent straight appearing vein near the splenic hilum which appeared appropriate for puncture. The under direct ultrasound visualization, the splenic vein was attempted to be cannulated with a 21 gauge Chiba needle making a proximally 6 separate passes, ultimately failing to gain access to the splenic vein. Completion ultrasound demonstrated no evidence of surrounding hematoma or other complicating features. Given failure of attempted trans splenic portal venous recanalization, additional attempt at transjugular transhepatic portosystemic shunt creation was attempted with ICE in fluoroscopic  guidance however from the right portal vein. A single pass was attempted at what appears to be in a track portal target in the right lobe of the liver, however there is inability to advance a wire into the structure. The needle set was removed once again. Next, attempted cannulating the large SMV to IVC ectopic varix in the right lower quadrant was attempted. Balloon occlusion venogram was performed which demonstrated primarily right gonadal vein opacification. There is no evidence of portal opacification for possible image guided targeting. At this point, the procedure was terminated. The catheters and sheath were removed and manual compression was applied to the right internal jugular and right greater saphenous venous access sites until hemostasis was achieved. The  patient was transferred to the PACU in stable condition. IMPRESSION: 1. Technically successful coil assisted retrograde transvenous obliteration of isolated large gastrorenal variceal shunt. 2. Technically unsuccessful transjugular intrahepatic portosystemic shunt creation and attempted transsplenic portal vein recanalization. 3. Technically successful right upper extremity image guided peripherally inserted central catheter placement. Marliss Coots, MD [facility] Radiology Electronically Signed   By: Marliss Coots M.D.   On: 06/30/2023 21:46   IR US Guide Vasc Access Left  Result Date: 06/30/2023 CLINICAL DATA:  67 year old female with history of NASH cirrhosis (Child Pugh B, MELD 20), portal hypertension, and gastroesophageal varices without history of hemorrhage. 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 She has very large gastroesophageal varices. 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 EXAM:  1. Ultrasound guided vascular access of the right basilic vein 2. Peripherally inserted central catheter placement 3. Ultrasound-guided access of the right internal jugular vein 4. Ultrasound-guided access of the right greater saphenous vein 5. Hepatic venogram 6. Intravascular ultrasound 7. Central venous manometery 8. Coil assisted retrograde transvenous obliteration of gastrorenal varices MEDICATIONS: As antibiotic prophylaxis, Rocephin 2 gm IV was ordered pre-procedure and administered intravenously within one hour of incision. ANESTHESIA/SEDATION: General - as administered by the Anesthesia department CONTRAST:  One hundred sixty ML Omnipaque 300, intravenous FLUOROSCOPY TIME:  Fluoroscopy Time: 40 minutes (2,117 mGy). COMPLICATIONS: None immediate. PROCEDURE: Informed written consent was obtained from the patient after a thorough discussion of the procedural risks, benefits and alternatives. All questions were addressed. Maximal Sterile Barrier Technique was utilized including caps, mask, sterile gowns, sterile gloves, sterile drape, hand hygiene and skin antiseptic. A timeout was performed prior to the initiation of the procedure. Preprocedure ultrasound evaluation of the right upper extremity demonstrated a patent basilic vein. The procedure was planned. Subdermal Local anesthesia was provided at the planned needle entry site with 1% lidocaine. A small skin nick was made. Under direct ultrasound visualization, the right basilic vein was punctured with a 21 gauge micropuncture needle. A permanent ultrasound image was captured and stored in the record. A guidewire was inserted over which a 5 French peel-away sheath was placed. The wire and inner portion of the sheath were removed and a 38 cm, dual lumen 5 French peripherally inserted central catheter were then placed. Catheter tip terminates near the cavoatrial junction. Both lumens flushed and aspirated with ease. A sterile bandage was applied. This venous  access was used by the department of anesthesia throughout the procedure. A preliminary ultrasound of the right groin was performed and demonstrates a patent right common femoral vein. A permanent ultrasound image was recorded. Using a combination of fluoroscopy and ultrasound, an access site was determined. A small dermatotomy was made at the planned puncture site. Using ultrasound guidance, access into the right common femoral vein was obtained with visualization of needle entry into the vessel using a standard micropuncture technique. A wire was advanced into the IVC insert all fascial dilation performed. An 8 [place], 11 cm vascular sheath was placed into the external iliac vein. Through this access site, an 51 [place] ICE catheter was advanced with ease under fluoroscopic guidance to the level of the intrahepatic inferior vena cava. A preliminary ultrasound of the right neck was performed and demonstrates a patent internal jugular vein. A permanent ultrasound image was recorded. Using a combination of fluoroscopy  and ultrasound, an access site was determined. A small dermatotomy was made at the planned puncture site. Using ultrasound guidance, access into the right internal jugular vein was obtained with visualization of needle entry into the vessel using a standard micropuncture technique. A wire was advanced into the IVC and serial fascial dilation performed. A 10 French tips sheath was placed into the internal jugular vein and advanced to the IVC. The jugular sheath was retracted into the right atrium and manometry was performed and measured to be a mean of 14 mm Hg. A 5 French angled tip catheter was then directed into the left renal vein. Left renal venogram was performed which demonstrated patency and patent inflow from a large gastro renal shunt. The gastro renal shunt was then selected. A balloon occlusion catheter was inserted and inflated to occlude the outflow. Dedicated variceal venogram was  then performed which demonstrated an isolated gastric varix with no significant evidence of venous outflow to the nearby esophageal varices or portal venous system. Transvenous obliteration was then performed with approximately 6 mL of a solution of Gel-Foam slurry, air, 3% sotradecol, and lipiodol. The occlusion balloon catheter was then flushed and coil embolization was performed at the inferior aspect of the gastro renal shunt with an assortment of 0.035" detachable Azur coils. The Fogarty catheter was then deflated and removed. Repeat left renal venogram demonstrated no evidence of reflux into the occluded gastro renal shunt. A 5 French angled tip catheter was then directed into the middle hepatic vein. Hepatic venogram was performed. These images demonstrated patent hepatic vein with no stenosis. The catheter was advanced to a wedge portion of the a patent vein over which the 10 French sheath was advanced into the middle hepatic vein. Using ICE ultrasound visualization the catheter as middle hepatic vein as well as the portal anatomy was defined. A planned exit site from the hepatic vein and puncture site from the suspected portal vein was placed into a single sonographic plane. Under direct ultrasound visualization, the ScorpionX needle was advanced into the suspected occluded portal vein. This was unsuccessful in cannulation. The needle set was removed. The left flank was then prepped and draped in standard fashion. Ultrasound demonstrated an enlarged spleen with a patent straight appearing vein near the splenic hilum which appeared appropriate for puncture. The under direct ultrasound visualization, the splenic vein was attempted to be cannulated with a 21 gauge Chiba needle making a proximally 6 separate passes, ultimately failing to gain access to the splenic vein. Completion ultrasound demonstrated no evidence of surrounding hematoma or other complicating features. Given failure of attempted trans splenic  portal venous recanalization, additional attempt at transjugular transhepatic portosystemic shunt creation was attempted with ICE in fluoroscopic guidance however from the right portal vein. A single pass was attempted at what appears to be in a track portal target in the right lobe of the liver, however there is inability to advance a wire into the structure. The needle set was removed once again. Next, attempted cannulating the large SMV to IVC ectopic varix in the right lower quadrant was attempted. Balloon occlusion venogram was performed which demonstrated primarily right gonadal vein opacification. There is no evidence of portal opacification for possible image guided targeting. At this point, the procedure was terminated. The catheters and sheath were removed and manual compression was applied to the right internal jugular and right greater saphenous venous access sites until hemostasis was achieved. The patient was transferred to the PACU in stable condition. IMPRESSION: 1. Technically  successful coil assisted retrograde transvenous obliteration of isolated large gastrorenal variceal shunt. 2. Technically unsuccessful transjugular intrahepatic portosystemic shunt creation and attempted transsplenic portal vein recanalization. 3. Technically successful right upper extremity image guided peripherally inserted central catheter placement. Marliss Coots, MD [facility] Radiology Electronically Signed   By: Marliss Coots M.D.   On: 06/30/2023 21:46   IR EMBO ART  VEN HEMORR LYMPH EXTRAV  INC GUIDE ROADMAPPING  Result Date: 06/30/2023 CLINICAL DATA:  67 year old female with history of NASH cirrhosis (Child Pugh B, MELD 20), portal hypertension, and gastroesophageal varices without history of hemorrhage. 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 She has very large gastroesophageal varices. 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 EXAM: 1. Ultrasound guided vascular access of the right basilic vein 2. Peripherally inserted central catheter placement 3. Ultrasound-guided access of the right internal jugular vein 4. Ultrasound-guided access of the right greater saphenous vein 5. Hepatic venogram 6. Intravascular ultrasound 7. Central venous manometery 8. Coil assisted retrograde transvenous obliteration of gastrorenal varices MEDICATIONS: As antibiotic prophylaxis, Rocephin 2 gm IV was ordered pre-procedure and administered intravenously within one hour of incision. ANESTHESIA/SEDATION: General - as administered by the Anesthesia department CONTRAST:  One hundred sixty ML Omnipaque 300, intravenous FLUOROSCOPY TIME:  Fluoroscopy Time: 40 minutes (2,117 mGy). COMPLICATIONS: None immediate. PROCEDURE: Informed written consent was obtained from the patient after a thorough discussion of the procedural risks, benefits and alternatives. All questions were addressed. Maximal Sterile Barrier Technique was utilized including caps, mask, sterile gowns, sterile gloves, sterile drape, hand hygiene and skin antiseptic. A timeout was performed prior to the initiation of the procedure. Preprocedure ultrasound evaluation of the right upper extremity demonstrated a patent basilic vein. The procedure was planned. Subdermal Local anesthesia was provided at the planned needle entry site with 1% lidocaine. A small skin nick was made. Under direct ultrasound visualization, the right basilic vein was punctured with a 21 gauge micropuncture needle. A permanent ultrasound image was captured and stored in the record. A guidewire was inserted over which a 5 French peel-away sheath was placed. The wire and inner portion of the sheath were removed and a 38 cm, dual lumen 5 French peripherally inserted  central catheter were then placed. Catheter tip terminates near the cavoatrial junction. Both lumens flushed and aspirated with ease. A sterile bandage was applied. This venous access was used by the department of anesthesia throughout the procedure. A preliminary ultrasound of the right groin was performed and demonstrates a patent right common femoral vein. A permanent ultrasound image was recorded. Using a combination of fluoroscopy and ultrasound, an access site was determined. A small dermatotomy was made at the planned puncture site. Using ultrasound guidance, access into the right common femoral vein was obtained with visualization of needle entry into the vessel using a standard micropuncture technique. A wire was advanced into the IVC insert all fascial dilation performed. An 8 [place], 11 cm vascular sheath was placed into the external iliac vein. Through this access site, an 78 [place] ICE catheter was advanced with ease under fluoroscopic guidance to the level of the intrahepatic inferior vena cava. A preliminary ultrasound of the right neck was performed and demonstrates a patent internal jugular vein. A permanent ultrasound image was recorded. Using a combination of fluoroscopy and ultrasound, an access site was  determined. A small dermatotomy was made at the planned puncture site. Using ultrasound guidance, access into the right internal jugular vein was obtained with visualization of needle entry into the vessel using a standard micropuncture technique. A wire was advanced into the IVC and serial fascial dilation performed. A 10 French tips sheath was placed into the internal jugular vein and advanced to the IVC. The jugular sheath was retracted into the right atrium and manometry was performed and measured to be a mean of 14 mm Hg. A 5 French angled tip catheter was then directed into the left renal vein. Left renal venogram was performed which demonstrated patency and patent inflow from a  large gastro renal shunt. The gastro renal shunt was then selected. A balloon occlusion catheter was inserted and inflated to occlude the outflow. Dedicated variceal venogram was then performed which demonstrated an isolated gastric varix with no significant evidence of venous outflow to the nearby esophageal varices or portal venous system. Transvenous obliteration was then performed with approximately 6 mL of a solution of Gel-Foam slurry, air, 3% sotradecol, and lipiodol. The occlusion balloon catheter was then flushed and coil embolization was performed at the inferior aspect of the gastro renal shunt with an assortment of 0.035" detachable Azur coils. The Fogarty catheter was then deflated and removed. Repeat left renal venogram demonstrated no evidence of reflux into the occluded gastro renal shunt. A 5 French angled tip catheter was then directed into the middle hepatic vein. Hepatic venogram was performed. These images demonstrated patent hepatic vein with no stenosis. The catheter was advanced to a wedge portion of the a patent vein over which the 10 French sheath was advanced into the middle hepatic vein. Using ICE ultrasound visualization the catheter as middle hepatic vein as well as the portal anatomy was defined. A planned exit site from the hepatic vein and puncture site from the suspected portal vein was placed into a single sonographic plane. Under direct ultrasound visualization, the ScorpionX needle was advanced into the suspected occluded portal vein. This was unsuccessful in cannulation. The needle set was removed. The left flank was then prepped and draped in standard fashion. Ultrasound demonstrated an enlarged spleen with a patent straight appearing vein near the splenic hilum which appeared appropriate for puncture. The under direct ultrasound visualization, the splenic vein was attempted to be cannulated with a 21 gauge Chiba needle making a proximally 6 separate passes, ultimately failing  to gain access to the splenic vein. Completion ultrasound demonstrated no evidence of surrounding hematoma or other complicating features. Given failure of attempted trans splenic portal venous recanalization, additional attempt at transjugular transhepatic portosystemic shunt creation was attempted with ICE in fluoroscopic guidance however from the right portal vein. A single pass was attempted at what appears to be in a track portal target in the right lobe of the liver, however there is inability to advance a wire into the structure. The needle set was removed once again. Next, attempted cannulating the large SMV to IVC ectopic varix in the right lower quadrant was attempted. Balloon occlusion venogram was performed which demonstrated primarily right gonadal vein opacification. There is no evidence of portal opacification for possible image guided targeting. At this point, the procedure was terminated. The catheters and sheath were removed and manual compression was applied to the right internal jugular and right greater saphenous venous access sites until hemostasis was achieved. The patient was transferred to the PACU in stable condition. IMPRESSION: 1. Technically successful coil assisted retrograde transvenous obliteration  of isolated large gastrorenal variceal shunt. 2. Technically unsuccessful transjugular intrahepatic portosystemic shunt creation and attempted transsplenic portal vein recanalization. 3. Technically successful right upper extremity image guided peripherally inserted central catheter placement. Marliss Coots, MD [facility] Radiology Electronically Signed   By: Marliss Coots M.D.   On: 06/30/2023 21:46    Labs:  CBC: Recent Labs    07/01/23 1653 07/02/23 0945 07/03/23 1307 07/04/23 0157  WBC 10.3 6.6 6.6 4.3  HGB 7.8* 7.0* 7.3* 6.3*  HCT 23.3* 20.8* 21.9* 19.8*  PLT 67* 51* 46* 24*    COAGS: Recent Labs    06/01/23 1120  06/30/23 0549 07/02/23 0945 07/03/23 1500  INR 1.3* 1.2 1.4* 1.3*    BMP: Recent Labs    07/01/23 1653 07/02/23 0945 07/03/23 1307 07/04/23 0157  NA 138 134* 136 135  K 3.5 3.5 3.6 3.8  CL 103 102 103 103  CO2 24 21* 21* 22  GLUCOSE 186* 201* 205* 182*  BUN 29* 31* 32* 34*  CALCIUM 8.9 8.6* 8.4* 8.5*  CREATININE 2.25* 1.97* 1.87* 2.03*  GFRNONAA 23* 27* 29* 26*    LIVER FUNCTION TESTS: Recent Labs    07/01/23 1653 07/02/23 0945 07/03/23 1500 07/04/23 0157  BILITOT 7.7* 5.1* 5.2* 5.2*  AST 183* 140* 105* 83*  ALT 62* 65* 59* 52*  ALKPHOS 105 109 127* 123  PROT 5.8* 5.6* 5.8* 5.5*  ALBUMIN 2.7* 2.5* 2.6* 2.3*    Assessment and Plan:  Dr Elby Showers recommendations:  No further IR intervention at this time.                                                   Blood transfusion as needed.                                                   Fluid resuscitation as needed.                                                   Pain control prn                                                   Will need follow up with Dr Elby Showers as OP for partial                                                splenic embolization to decrease portal flow  Dr Elby Showers will try to see pt before DC this hospitalization    Electronically Signed: Robet Leu, PA-C 07/04/2023, 11:12 AM   I spent a total of 15 Minutes at the the patient's bedside AND on the patient's hospital floor or unit, greater than 50% of which was counseling/coordinating care for post Coil  assisted retrograde transvenous obliteration of gastrorenal Varices 06/30/23

## 2023-07-04 NOTE — Consult Note (Addendum)
Cardiology Consultation   Patient ID: Hannah Khan MRN: [id number]; DOB: [birth year]  Admit date: 07/03/2023 Date of Consult: 07/04/2023  PCP:  Darrow Bussing, MD   [facility] Providers Cardiologist:  Chilton Si, MD   {   Patient Profile:   Hannah Khan is a 67 y.o. female with a hx of HFpEF, morbid obesity, OSA, NASH cirrhosis, esophageal varices, history of hepatic encephalopathy, s/p unsuccessful TIPS 06/30/2023, thrombocytopenia, type 2 diabetes, aortic stenosis, vasovagal syncope, and permanent atrial fibrillation, recurrent panniculitis, who is being seen 07/04/2023 for the evaluation of A fib RVR at the request of Dr Mahala Menghini.  History of Present Illness:   [name] with above PMH who presented to ER for nausea. She recently underwent unsuccessful  transjugular intrahepatic portosystemic shunt (TIPS) creation and attempted transsplenic portal vein recanalization on 06/30/2023 by IR, but Dr. Elby Showers was able to perform balloon occlusion retrograde transvenous obliteration of the gastric varix. Postprocedure she developed hypotension requiring vasopressor support, also developed A-fib RVR the next morning.  She was anemic (hemoglobin dropped from 11.6-7) with abdominal pain.  CTAP revealed a small to moderate volume of hemoperitoneum.  She was monitored for 48 hours and ultimately discharged on 07/02/2023.    She returned to the ER 07/03/2023 for nausea, vomiting, hypotension with blood pressure 80/50, and A-fib RVR with heart rate 110-130s.  CTAP repeat revealed decrease in size of hemoperitoneum.  Hemoglobin stable around 7.  She was treated with IV fluids/albumin, nausea medication, and was given ceftriaxone for suspected panniculitis.  Early morning on 07/04/2023, she was noted with new onset hypoxia 85% on room air requiring 2 L nasal cannula.  Chest x-ray revealed cardiomegaly with vascular congestion and interstitial edema that was increased compared to  prior imaging.  VQ scan remains pending. IV fluids and albumin had been stopped.  Her midodrine was increased to 20 mg 3 times daily for blood pressure support.  She was felt volume overloaded with concern of cardiorenal syndrome, was given PO lasix 40mg  x1 today.  Cardiology is consulted now for concern of heart failure exacerbation as well as A-fib RVR.   Admission diagnostic on 07/03/2023 revealed bicarb 21, glucose 205, BUN 32, creatinine 1.87, GFR 29, alk phos 127, albumin 2.6, AST 105, ALT 59, total bilirubin 5.2.  High sensitive troponin 41 >50 >38.  CBC with hemoglobin 7.3 and platelet 46k.  INR 1.3.  Lactic acid 1.6.  Repeat labs today showed BUN 34, creatinine elevated 2.03, albumin 2.3, slight improvement of AST 83, ALT 52, and persistent total bilirubin elevated 5.2 and GFR of 26.  CBC with hemoglobin down to 6.3 and platelet 24k. BNP 531.  She was given 2 unit PRBC transfusion today.  Blood culture x 2 had been sent.  Lactic acid has been elevated to 2.  She was maintained on Keflex 500 mg twice daily.  Due to A-fib RVR and hypotension, she was started on amiodarone gtt while continued on digoxin and low-dose metoprolol 12.5 mg twice daily.   She felt she is more SOB than usual, endorses orthopnea over the past few days. She states she had received a lot of IVF over the past few days. She states her legs are more swelling than baseline. She states her weight was 322 ib before TIPS procedure, it's 331 ib now. She denied any chest pain, dizziness, syncope.   Per chart review, patient was initially diagnosed with atrial fibrillation 02/2020, he was seen by Dr. Ladona Ridgel 07/2020 and was recommended  medical therapy with diltiazem and metoprolol as she was asymptomatic.  She is historically not on anticoagulation due to cirrhosis/thrombocytopenia/esophageal varices.   She had a hospitalization 01/2021 for A-fib and hepatic encephalopathy, another hospitalization 03/2021 for acute on chronic diastolic heart  failure due to dietary indiscretion.  After that she was started on Farxiga, Bumex, as needed metolazone.  Spironolactone had been trialed in the past but discontinued due to hypotension.  She was reportedly self adjusting Bumex dose on 12/12/2022 visit, was encouraged with compliance.  TIPS on/5/24 was aborted due to RA pressure 23 mmHg, she was recommended optimize volume status before reconsideration. She continued to self adjust diuretic reportedly on 12/29/2022 visit.  Weight was 324 pounds on 02/14/2023 office visit (lowest).    She was hospitalized 03/05/23 for abdominal wall cellulitis with sepsis, started on midodrine for hypotension, was also diuresed for CHF.  Echo from 03/07/2023 with LVEF 55 to 60%, no regional motion abnormality, normal RV, mildly elevated PASP 39.6 mmHg, severe bilateral atrial dilatation, moderate tricuspid regurgitation, mild to moderate aortic stenosis with mean gradient 15 mmHg and peak gradient 21.4 mmHg.  Jardiance was discontinued due to recurrent cellulitis, she was discharged on torsemide 20 mg twice daily with weight 338 ib. Seen by Ms Dan Humphreys NP-C 04/18/23 , felt volume overloaded, advised to take additional metolazone that week.  Her midodrine was increased to 10 mg 3 times daily for hypotension.  She was continued on diltiazem and metoprolol for permanent atrial fibrillation. She was referred to advanced heart failure clinic.    She was seen by Dr. Gasper Lloyd on 04/28/2023, given severely elevated RA 23 mmHg during TIPS, she was suspected underlying pulmonary hypertension 2/2 OHS /OSA/diastolic heart failure.  Digoxin was reduced to 0.125 mcg daily, torsemide increased to 80 mg daily for 5 days before dropping to 60 mg daily.  RHC was planned, it was felt she may be candidate for [facility] therapy if she has pulmonary arterial hypertension before TIPS.    She followed up with Prince Rome NP on 05/08/2023, ReDs 31%, torsemide was increased to 80 mg / 40 mg daily, metoprolol was  reduced to 12.5 mg twice daily to allow more blood pressure room for diuresis.  Blood pressure remains limiting for GDMT, and she was continued on midodrine 10 mg 3 times daily.   Right heart cath on 05/19/2023 revealed RA 10 mmHg, RV 34/10 mmHg, PA 37/18 mmHg, PCWP 23 mmHg. Fick CO 8.5L/min and CI 3.4 L/min/m2.  PVR 0.5, PAPI 2.4.   She followed up with interventional radiology Dr. Elby Showers on 05/31/2023, felt significant improvement of volume status with acceptable RA pressure from recent heart cath.  She was accepted for TIPS creation with possible trans splenic and portal vein recannulation and variceal embolization.  She presented to [facility] on [facility]/30/2023 for TIPS creation, unfortunately was unsuccessful but Dr. Elby Showers was able to perform balloon occlusion retrograde transvenous obliteration of the gastric varix.  Postprocedure she developed hypotension requiring vasopressor support as well as A-fib RVR the next morning.  She was anemic (hemoglobin dropped from 11.6-7 )with abdominal pain after overnight observation.  CTAP revealed a small to moderate volume of hemoperitoneum.  She was monitored for 48 hours and ultimately discharged on 07/02/2023.   Past Medical History:  Diagnosis Date   Anemia    hx of   Aortic stenosis 02/2023   Echo 03/07/23 LVEF 55-60%, mild to moderate AS   Arthritis    knees   CHF (congestive heart failure) (HCC)  chronic diastolic   Diabetes mellitus without complication (HCC)    type 2   Dyspnea    Dysrhythmia    Fibromyalgia    Gastric varices    large   H/O transfusion of platelets    Heart murmur    never has caused any problems   Hx of colonic polyps    s/p partial colectomy   Hyperlipidemia    Hypertension    Joint pain    in knees and back spasms   Left leg swelling    wear compression hose   Liver cirrhosis secondary to NASH (nonalcoholic steatohepatitis) (HCC) dx nov 2014   had enlarged spleen also    Morbid obesity (HCC)    Persistent  atrial fibrillation (HCC)    Pneumonia 10/2016   x 5   PONV (postoperative nausea and vomiting)    in past none recent   Portal hypertension (HCC)    Sleep apnea    uses cpap, setting varies between 14-20   Spleen enlarged    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 [facility])     Past Surgical History:  Procedure Laterality Date   BREAST LUMPECTOMY WITH RADIOACTIVE SEED LOCALIZATION Right 08/29/2017   Procedure: RIGHT BREAST LUMPECTOMY WITH RADIOACTIVE SEEDS LOCALIZATION;  Surgeon: Harriette Bouillon, MD;  Location: [facility] OR;  Service: General;  Laterality: Right;   CESAREAN SECTION  1989   CHOLECYSTECTOMY  20 yrs ago   COLECTOMY  2011   COLONOSCOPY     COLONOSCOPY N/A 09/05/2018   Procedure: COLONOSCOPY;  Surgeon: Willis Modena, MD;  Location: [facility] ENDOSCOPY;  Service: Endoscopy;  Laterality: N/A;   COLONOSCOPY WITH PROPOFOL Bilateral 05/04/2022   Procedure: COLONOSCOPY WITH PROPOFOL;  Surgeon: Willis Modena, MD;  Location: [facility] ENDOSCOPY;  Service: Gastroenterology;  Laterality: Bilateral;   DILATATION & CURETTAGE/HYSTEROSCOPY WITH MYOSURE N/A 05/06/2016   Procedure: DILATATION & CURETTAGE/HYSTEROSCOPY WITH MYOSURE WITH POLYPECTOMY;  Surgeon: Myna Hidalgo, DO;  Location: [facility] ORS;  Service: Gynecology;  Laterality: N/A;   ESOPHAGOGASTRODUODENOSCOPY (EGD) WITH PROPOFOL N/A 09/04/2013   Procedure: ESOPHAGOGASTRODUODENOSCOPY (EGD) WITH PROPOFOL;  Surgeon: Willis Modena, MD;  Location: [facility];  Service: Endoscopy;  Laterality: N/A;   ESOPHAGOGASTRODUODENOSCOPY (EGD) WITH PROPOFOL Bilateral 05/04/2022   Procedure: ESOPHAGOGASTRODUODENOSCOPY (EGD) WITH PROPOFOL;  Surgeon: Willis Modena, MD;  Location: [facility];  Service: Gastroenterology;  Laterality: Bilateral;   IR EMBO ART  VEN HEMORR LYMPH EXTRAV  INC GUIDE ROADMAPPING  06/30/2023   IR INTRAVASCULAR ULTRASOUND NON CORONARY  12/23/2022   IR RADIOLOGIST EVAL & MGMT  09/21/2022   IR RADIOLOGIST EVAL & MGMT  10/06/2022   IR RADIOLOGIST EVAL &  MGMT  11/02/2022   IR RADIOLOGIST EVAL & MGMT  01/31/2023   IR RADIOLOGIST EVAL & MGMT  05/31/2023   IR TIPS  12/23/2022   IR TIPS  06/30/2023   IR US GUIDE VASC ACCESS LEFT  06/30/2023   IR US GUIDE VASC ACCESS RIGHT  12/23/2022   IR US GUIDE VASC ACCESS RIGHT  06/30/2023   IR US GUIDE VASC ACCESS RIGHT  06/30/2023   KNEE ARTHROSCOPY  yrs ago   bilateral, one done x 1, one done twice   POLYPECTOMY  09/05/2018   Procedure: POLYPECTOMY;  Surgeon: Willis Modena, MD;  Location: [facility];  Service: Endoscopy;;   RADIOLOGY WITH ANESTHESIA N/A 12/23/2022   Procedure: TIPS;  Surgeon: Bennie Dallas, MD;  Location: [facility] OR;  Service: Radiology;  Laterality: N/A;   RIGHT HEART CATH N/A 05/19/2023   Procedure: RIGHT HEART CATH;  Surgeon: Dorthula Nettles, DO;  Location: [facility] INVASIVE CV LAB;  Service: Cardiovascular;  Laterality: N/A;   TIPS PROCEDURE N/A 06/30/2023   Procedure: TRANS-JUGULAR INTRAHEPATIC PORTAL SHUNT (TIPS);  Surgeon: Bennie Dallas, MD;  Location: [facility] OR;  Service: Radiology;  Laterality: N/A;     Home Medications:  Prior to Admission medications   Medication Sig Start Date End Date Taking? Authorizing Provider  albuterol (VENTOLIN HFA) 108 (90 Base) MCG/ACT inhaler Inhale 1-2 puffs into the lungs every 6 (six) hours as needed for wheezing or shortness of breath.   Yes [provider]  digoxin (LANOXIN) 0.125 MG tablet Take 1 tablet (0.125 mg total) by mouth daily. 04/28/23  Yes Sabharwal, Aditya, DO  diltiazem (CARDIZEM CD) 180 MG 24 hr capsule Take 1 capsule (180 mg total) by mouth daily. 04/18/23  Yes Alver Sorrow, NP  lactulose (CHRONULAC) 10 GM/15ML solution Take 20 g by mouth 3 (three) times daily. 03/17/21  Yes [provider]  Lidocaine HCl-Benzyl Alcohol (SALONPAS LIDOCAINE PLUS) 4-10 % CREA Apply 1 Application topically daily as needed (knee pain).   Yes [provider]  metolazone (ZAROXOLYN) 5 MG tablet Take 1 tablet (5 mg total) by mouth once a  week. Take one tablet on Sundays 30 minutes before Bumex 02/28/23  Yes Alver Sorrow, NP  metoprolol tartrate (LOPRESSOR) 25 MG tablet Take 0.5 tablets (12.5 mg total) by mouth 2 (two) times daily. 05/08/23  Yes Milford, Anderson Malta, FNP  midodrine (PROAMATINE) 10 MG tablet Take 1 tablet (10 mg total) by mouth 3 (three) times daily. 04/18/23  Yes Alver Sorrow, NP  Multiple Vitamins-Minerals (PRESERVISION AREDS 2) CAPS Take 1 capsule by mouth 2 (two) times daily.   Yes [provider]  ondansetron (ZOFRAN) 4 MG tablet Take 1 tablet (4 mg total) by mouth every 8 (eight) hours as needed for up to 7 days for nausea or vomiting. 07/02/23 07/09/23 Yes Covington, Arman Filter, NP  oxycodone (OXY-IR) 5 MG capsule Take 1 capsule (5 mg total) by mouth every 4 (four) hours as needed for up to 5 days for pain. 07/02/23 07/07/23 Yes Covington, Arman Filter, NP  potassium chloride (MICRO-K) 10 MEQ CR capsule Take 10 capsules (100 mEq total) by mouth 2 (two) times daily. Take an extra 4 tablets when taking Metolazone. 06/15/23  Yes Andrey Farmer, PA-C  Propylene Glycol (SYSTANE BALANCE) 0.6 % SOLN Place 1 drop into both eyes 2 (two) times daily.   Yes [provider]  torsemide (DEMADEX) 20 MG tablet Take 4 tablets (80 mg total) by mouth every morning AND 3 tablets (60 mg total) every evening. 06/01/23  Yes Alen Bleacher, NP  XIFAXAN 550 MG TABS tablet Take 550 mg by mouth 2 (two) times daily. 03/13/21  Yes [provider]  doxycycline (ADOXA) 100 MG tablet Take 100 mg by mouth 2 (two) times daily. Per patient last dose will be Friday 06-30-23 Patient not taking: Reported on 07/03/2023    [provider]  Lancets [facility] DELICA PLUS [place]) MISC Apply topically 3 (three) times daily. 04/10/20   [provider]  [facility] VERIO test strip 1 each 3 (three) times daily. 04/13/20   [provider]    Inpatient Medications: Scheduled Meds:  cephALEXin  500 mg Oral  Q12H   digoxin  0.125 mg Oral Daily   insulin aspart  0-9 Units Subcutaneous Q4H   lactulose  20 g Oral TID   metoprolol tartrate  12.5 mg Oral  BID   midodrine  20 mg Oral TID WC   rifaximin  550 mg Oral BID   Continuous Infusions:  amiodarone 30 mg/hr (07/04/23 1316)   PRN Meds: naLOXone (NARCAN)  injection, ondansetron **OR** ondansetron (ZOFRAN) IV, oxyCODONE  Allergies:    Allergies  Allergen Reactions   Celebrex [Celecoxib] Other (See Comments)    "speech slurred" with celebrex   Shellfish Allergy Swelling and Rash    TINGLING AND SWELLING OF LIPS. LARGE WHELPS QUARTER-SIZE   Barium-Containing Compounds Other (See Comments)    TACHYCARDIA   Prednisone Other (See Comments)    TACHYCARDIA   Statins Other (See Comments)    AGGRAVATED FIBROMYALGIA   Fentanyl     Hallucinations    Ibuprofen Other (See Comments)    UNSPECIFIED SPECIFIC REACTION >> "DUE TO PLATELETS"   Sglt2 Inhibitors     Ozempic Severe gastroparesis    Tramadol     Hallucination    Tylenol [Acetaminophen] Other (See Comments)    UNSPECIFIED SPECIFIC REACTION >> "DUE TO PLATELETS"   Zetia [Ezetimibe]     Headache    Social History:   Social History   Socioeconomic History   Marital status: Married    Spouse name: Not on file   Number of children: Not on file   Years of education: Not on file   Highest education level: Not on file  Occupational History   Occupation: Scientist, product/process development  Tobacco Use   Smoking status: Former    Current packs/day: 0.00    Average packs/day: 1 pack/day for 3.0 years (3.0 ttl pk-yrs)    Types: Cigarettes    Start date: 09/20/1971    Quit date: 09/19/1974    Years since quitting: 48.8   Smokeless tobacco: Never  Vaping Use   Vaping status: Never Used  Substance and Sexual Activity   Alcohol use: Not Currently    Comment: occ   Drug use: No   Sexual activity: Not on file  Other Topics Concern   Not on file  Social History Narrative   Lives in [place] with  her husband.  Instructor for early childhood development.     Social Determinants of Health   Financial Resource Strain: Not on file  Food Insecurity: No Food Insecurity (07/04/2023)   Hunger Vital Sign    Worried About Running Out of Food in the Last Year: Never true    Ran Out of Food in the Last Year: Never true  Transportation Needs: No Transportation Needs (07/04/2023)   PRAPARE - Administrator, Civil Service (Medical): No    Lack of Transportation (Non-Medical): No  Physical Activity: Not on file  Stress: Not on file  Social Connections: Unknown (01/31/2022)   [facility] from [facility]   Social Network    Social Network: Not on file  Intimate Partner Violence: Not At Risk (07/04/2023)   Humiliation, Afraid, Rape, and Kick questionnaire    Fear of Current or Ex-Partner: No    Emotionally Abused: No    Physically Abused: No    Sexually Abused: No    Family History:    Family History  Problem Relation Age of Onset   Heart failure Mother    Cancer Mother    Hyperlipidemia Mother    Congestive Heart Failure Mother    Stroke Mother    Lung cancer Father    Cancer Father    Cancer Brother    Hyperlipidemia Brother    Stroke Other  Breast cancer Neg Hx      ROS:  Constitutional: Denied fever, chills, malaise, night sweats Eyes: Denied vision change or loss Ears/Nose/Mouth/Throat: Denied ear ache, sore throat, coughing, sinus pain Cardiovascular: see HPI  Respiratory: see HPI  Gastrointestinal: see HPI  Genital/Urinary: Denied dysuria, hematuria, urinary frequency/urgency Musculoskeletal: Denied muscle ache, joint pain, weakness Skin: Denied rash, wound Neuro: Denied headache, dizziness, syncope Psych: Denied history of depression/anxiety  Endocrine: history of diabetes    Physical Exam/Data:   Vitals:   07/04/23 1000 07/04/23 1055 07/04/23 1137 07/04/23 1525  BP: 124/64 (!) 103/57 (!) 102/55 (!) 113/53  Pulse: (!) 111 92 93  (!) 107  Resp: 19 19 15 18   Temp:   98 F (36.7 C) 97.6 F (36.4 C)  TempSrc:   Oral   SpO2: 99% 98% 99% 100%    Intake/Output Summary (Last 24 hours) at 07/04/2023 1626 Last data filed at 07/04/2023 1534 Gross per 24 hour  Intake 569.11 ml  Output 800 ml  Net -230.89 ml      06/30/2023    8:08 PM 06/30/2023    5:59 AM 06/28/2023    3:57 PM  Last 3 Weights  Weight (lbs) 331 lb 2.1 oz 322 lb 6.4 oz 323 lb 10.2 oz  Weight (kg) 150.2 kg 146.24 kg 146.8 kg     There is no height or weight on file to calculate BMI.   Vitals:  Vitals:   07/04/23 1137 07/04/23 1525  BP: (!) 102/55 (!) 113/53  Pulse: 93 (!) 107  Resp: 15 18  Temp: 98 F (36.7 C) 97.6 F (36.4 C)  SpO2: 99% 100%   General Appearance: In no apparent distress, laying in bed, morbidly obese  HEENT: Normocephalic, atraumatic.  Neck: Supple, trachea midline, no JVDs Cardiovascular: Regular rate and rhythm, normal S1-S2,  no murmur Respiratory: Resting breathing unlabored, lungs sounds diminished at base bilaterally, on 2LNC oxygen Gastrointestinal: Bowel sounds positive, abdomen soft, non-tender, non-distended. No mass or organomegaly.  Extremities: Able to move all extremities in bed without difficulty, BLE 1-2 + edema, chronic venous stasis change noted  Musculoskeletal: Normal muscle bulk and tone Skin: Intact, warm, dry.  Neurologic: Alert, oriented to person, place and time. no gross focal neuro deficit Psychiatric: Normal affect. Mood is appropriate.      EKG:  The EKG was personally reviewed and demonstrates:    EKG from 07/03/23 Showed A fib 99 bpm, lateral T wave flattening, anteroseptal Q, no acute change   Telemetry:  Telemetry was personally reviewed and demonstrates:    A fib with ventricular rate of 100-130s, currently at 110s   Relevant CV Studies:   RHC on 05/19/23:   HEMODYNAMICS: RA:                  10 mmHg (mean) RV:                  34/10 mmHg PA:                  37/18 mmHg  (28 mean) PCWP:            23 mmHg (mean)                                      Estimated Fick CO/CI   8.5 L/min, 3.4 L/min/m2 Thermodilution CO/CI   9 L/min, 3. 6 L/min/m2  TPG                 5  mmHg                                              PVR                 0.5 Wood Units  PAPi                2.4       IMPRESSION: Mildly elevated pre and postcapillary filling pressures Normal cardiac output/cardiac index Normal PVR and papi   Echo from 03/07/23:   1. Left ventricular ejection fraction, by estimation, is 55 to 60%. The  left ventricle has normal function. The left ventricle has no regional  wall motion abnormalities. Left ventricular diastolic function could not  be evaluated.   2. Right ventricular systolic function is normal. The right ventricular  size is normal. There is mildly elevated pulmonary artery systolic  pressure. The estimated right ventricular systolic pressure is 39.6 mmHg.   3. Left atrial size was severely dilated.   4. Right atrial size was severely dilated.   5. The mitral valve is normal in structure. No evidence of mitral valve  regurgitation.   6. Tricuspid valve regurgitation is moderate.   7. The aortic valve is tricuspid. There is moderate calcification of the  aortic valve. There is moderate thickening of the aortic valve. Aortic  valve regurgitation is not visualized. Mild to moderate aortic valve  stenosis.   8. The inferior vena cava is dilated in size with <50% respiratory  variability, suggesting right atrial pressure of 15 mmHg.   Comparison(s): No significant change from prior study. Prior images  reviewed side by side.     Laboratory Data:  High Sensitivity Troponin:   Recent Labs  Lab 07/03/23 1307 07/03/23 1508 07/04/23 0157  TROPONINIHS 41* 50* 38*     Chemistry Recent Labs  Lab 07/02/23 0945 07/03/23 1307 07/04/23 0157  NA 134* 136 135  K 3.5 3.6 3.8  CL 102 103 103   CO2 21* 21* 22  GLUCOSE 201* 205* 182*  BUN 31* 32* 34*  CREATININE 1.97* 1.87* 2.03*  CALCIUM 8.6* 8.4* 8.5*  GFRNONAA 27* 29* 26*  ANIONGAP 11 12 10     Recent Labs  Lab 07/02/23 0945 07/03/23 1500 07/04/23 0157  PROT 5.6* 5.8* 5.5*  ALBUMIN 2.5* 2.6* 2.3*  AST 140* 105* 83*  ALT 65* 59* 52*  ALKPHOS 109 127* 123  BILITOT 5.1* 5.2* 5.2*   Lipids No results for input(s): "CHOL", "TRIG", "HDL", "LABVLDL", "LDLCALC", "CHOLHDL" in the last 168 hours.  Hematology Recent Labs  Lab 07/02/23 0945 07/03/23 1307 07/04/23 0157 07/04/23 1100  WBC 6.6 6.6 4.3  --   RBC 2.11* 2.17* 1.92*  --   HGB 7.0* 7.3* 6.3* 7.5*  HCT 20.8* 21.9* 19.8* 22.5*  MCV 98.6 100.9* 103.1*  --   MCH 33.2 33.6 32.8  --   MCHC 33.7 33.3 31.8  --   RDW 15.9* 15.6* 16.0*  --   PLT 51* 46* 24*  --    Thyroid No results for input(s): "TSH", "FREET4" in the last 168 hours.  BNP Recent Labs  Lab 07/04/23 0157  BNP 531.6*    DDimer No results for input(s): "DDIMER" in the last 168 hours.  Radiology/Studies:  NM Pulmonary Perfusion  Result Date: 07/04/2023 CLINICAL DATA:  CHF, shortness of breath and chest pain. EXAM: NUCLEAR MEDICINE PERFUSION LUNG SCAN TECHNIQUE: Perfusion images were obtained in multiple projections after intravenous injection of radiopharmaceutical. Ventilation scans intentionally deferred if perfusion scan and chest x-ray adequate for interpretation during COVID 19 epidemic. RADIOPHARMACEUTICALS:  4.4 mCi Tc-68m MAA IV COMPARISON:  Chest x-ray 07/03/2019 FINDINGS: Lateral projections are limited due to patient having difficulty tolerating the procedure. Heart is enlarged with elevation of the left hemidiaphragm. On x-ray there is also some left lung base opacity. Perfusion defect at the left lung base is corresponding to the area of the lung opacity. A few small subsegmental defects elsewhere. IMPRESSION: Conglomeration of findings consistent with a low probability perfusion lung  scan. Electronically Signed   By: Karen Kays M.D.   On: 07/04/2023 16:07   DG Chest Port 1 View  Result Date: 07/04/2023 CLINICAL DATA:  Possible hypoxia EXAM: PORTABLE CHEST 1 VIEW COMPARISON:  07/01/2023 FINDINGS: Cardiomegaly with vascular congestion and diffuse increased interstitial opacity suspicious for edema. Suspected airspace disease at the left base. Aortic atherosclerosis. IMPRESSION: Cardiomegaly with vascular congestion and interstitial edema increased compared to prior radiograph. Difficult to exclude left effusion or left basilar airspace disease Electronically Signed   By: Jasmine Pang M.D.   On: 07/04/2023 00:08   CT ABDOMEN PELVIS WO CONTRAST  Result Date: 07/03/2023 CLINICAL DATA:  Abdominal pain. Generalized weakness and hypotension. Hemoperitoneum EXAM: CT ABDOMEN AND PELVIS WITHOUT CONTRAST TECHNIQUE: Multidetector CT imaging of the abdomen and pelvis was performed following the standard protocol without IV contrast. RADIATION DOSE REDUCTION: This exam was performed according to the departmental dose-optimization program which includes automated exposure control, adjustment of the mA and/or kV according to patient size and/or use of iterative reconstruction technique. COMPARISON:  CT abdomen and pelvis without contrast 07/01/23 FINDINGS: Lower chest: Left greater than right pleural effusions are stable. Mild atelectasis is present at the left base. The heart is enlarged. Blood chambers are hypodense, consistent with anemia. Hepatobiliary: Nodular irregularity is present within the liver. No discrete lesions are present. Hemoperitoneum along the anterior margin of the liver is decreased in size compared to the prior exam. Cholecystectomy noted. No significant intra or extrahepatic biliary dilation is present. Pancreas: Unremarkable. No pancreatic ductal dilatation or surrounding inflammatory changes. Spleen: Splenomegaly again noted. Adrenals/Urinary Tract: The adrenal glands are  normal bilaterally. Bilateral simple cysts are stable. The ureters are within normal limits. The urinary bladder is mostly collapsed. Stomach/Bowel: The stomach and duodenum are within normal limits. Small bowel is unremarkable. Terminal ileum is normal. Appendectomy noted. Ascending and transverse colon within normal limits. Descending and sigmoid colon are normal. Vascular/Lymphatic: Scattered atherosclerotic calcifications are present within the aorta and branch vessels. No aneurysm is present. Cavernous transformation of the portal vein is again noted. Aneurysm coils are present. No significant adenopathy is present. Reproductive: IUD remains. Uterus and adnexa are otherwise within normal limits. Other: Hemoperitoneum extending into the pelvis is decreased compared to the prior exam. No new hemorrhage is present. Diffuse subcutaneous edema is present. No free air is present. Musculoskeletal: Mild degenerative changes are again noted in the thoracolumbar spine. No focal osseous lesions are present. Bony pelvis is normal. The hips are located and within normal limits bilaterally. IMPRESSION: 1. Decreased hemoperitoneum compared to the prior exam. 2. Stable left greater than right pleural effusions. 3. Stable cardiomegaly. 4. Anemia. 5. Stable splenomegaly. 6. Stable simple cysts of both kidneys. 7. Cavernous  transformation of the portal vein. 8.  Aortic Atherosclerosis (ICD10-I70.0). Electronically Signed   By: Marin Roberts M.D.   On: 07/03/2023 21:59   CT ABDOMEN PELVIS WO CONTRAST  Result Date: 07/01/2023 CLINICAL DATA:  Postop abdominal pain. EXAM: CT ABDOMEN AND PELVIS WITHOUT CONTRAST TECHNIQUE: Multidetector CT imaging of the abdomen and pelvis was performed following the standard protocol without IV contrast. RADIATION DOSE REDUCTION: This exam was performed according to the departmental dose-optimization program which includes automated exposure control, adjustment of the mA and/or kV  according to patient size and/or use of iterative reconstruction technique. COMPARISON:  04/06/2023 FINDINGS: Lower chest: Small left effusion. Hepatobiliary: Morphologic features of the liver compatible with cirrhosis identified. This is similar to the previous exam. Isodense fluid extending over the right lobe of liver is identified. Status post cholecystectomy. Unchanged increase caliber of the CBD. Pancreas: Unremarkable. No pancreatic ductal dilatation or surrounding inflammatory changes. Spleen: Splenomegaly, unchanged. There is a small amount of isodense fluid which extends around the posterior and inferior spleen, image 47/4. Adrenals/Urinary Tract: Normal appearance of the adrenal glands. Bilateral kidney cysts are stable compared with the previous X study. No follow-up imaging recommended. No signs of obstructive uropathy. Urinary bladder is partially decompressed containing contrast material from previous imaging. Stomach/Bowel: Stomach appears normal. No pathologic dilatation of the large or small bowel loops. Signs of previous right hemicolectomy. No bowel wall thickening or inflammation. Vascular/Lymphatic: Aortic atherosclerosis. No aneurysm. Edema identified within the small bowel mesentery. Nonspecific in the setting of cirrhosis with portal venous hypertension. Similar appearance of esophageal and gastric varices. Increased by collateral vessel formation within the porta hepatic region compatible with chronic portal vein occlusion with cavernous transformation. No signs of abdominopelvic adenopathy. Reproductive: IUD identified within the uterus. There is no adnexal mass. Other: Intermediate density fluid is noted extending along bilateral pericolic gutters to the peritoneal reflections of the lower abdomen/upper pelvis. Measured in the left lower quadrant of the abdomen fluid measures 39 Hounsfield units. I would consider this a small to moderate volume of hemoperitoneum. No focal fluid  collections identified. Visualized osseous structures are unremarkable. Musculoskeletal: No acute or significant osseous findings. IMPRESSION: 1. Interval development of small to moderate volume of hemoperitoneum within the abdomen and pelvis. This extends over the liver, spleen, along the pericolic gutters and peritoneal reflections of the lower abdomen and pelvis. 2. Cirrhosis with portal venous hypertension. Similar appearance of esophageal and gastric varices. 3. Chronic portal vein occlusion with cavernous transformation. 4. Small left pleural effusion. 5.  Aortic Atherosclerosis (ICD10-I70.0). Critical Value/emergent results were called by telephone at the time of interpretation on 07/01/2023 at 11:12 am to provider [facility] , who verbally acknowledged these results. Electronically Signed   By: Signa Kell M.D.   On: 07/01/2023 11:14   DG Chest 1 View  Result Date: 07/01/2023 CLINICAL DATA:  Shortness of breath. EXAM: CHEST  1 VIEW COMPARISON:  One-view chest x-ray 03/09/2023 FINDINGS: Heart is enlarged. Mild pulmonary vascular congestion is present without frank edema. No focal airspace disease is present. The visualized soft tissues and bony thorax are unremarkable. IMPRESSION: Cardiomegaly and mild pulmonary vascular congestion without frank edema. Electronically Signed   By: Marin Roberts M.D.   On: 07/01/2023 10:10     Assessment and Plan:   Permanent A fib RVR -In the setting of acute on chronic anemia with hemoglobin 6.3 today, agree with blood transfusion -Agree with blood culture, monitor fever curve, treat underlying infection if present -OK to continue amiodarone  for short course, avoid prolong use due to risk of toxicity, goal to treat underlying cause of tachyarrhythmia -Continue digoxin and metoprolol current dose, would not aggressively lower heart rate until underlying cause corrected -Poor candidate for anticoagulation due auto coagulopathy 2/2 cirrhosis - Keep  Mag >2 and K >4   Acute heart failure with preserved EF - increased resting SOB, orthopnea, leg edema - BNP 531, CXR with vascular congestion and interstitial edema today - on PTA torsemide 80mg  AM and 60mg  PM, PRN metolazone when weight gain >3-5 pounds in a week - will start IV Lasix 80mg  BID now, monitor I&O, daily weight, and BMP; wean off oxygen as tolerated  - GDMT: Low BP has been limiting historically for GDMT, remains on midodrine (increased to 20mg  TID),  continue digoxin 0.125mg  daily/may need to stop if AKI worsens despite diuresis, continue metoprolol 12.5mg  BID, will not add additional therapy, not a candidate for ARNI/MRA/SGLT2I now due to AKI - if inadequate response, may involve advanced heart failure team tomorrow   Pulmonary hypertension - Echo from 03/07/23 with elevated PASP 39.6 mmHg - RHC 05/19/23 with RA 10 mmHg, RV 34/10 mmHg, PA 37/18 mmHg, PCWP 23 mmHg, normal CO /CI - suspect multifactorial from OSA/OHS/portal hypertension/group 2 - diuresis to euvolemia   AKI on CKD IIIb - Cr 1.39 and GFR 42 on 06/30/23 before TIPS, rising to 2.25 on 07/01/23, now 2.03 - will diuresis as above for CHF, renal index should improve if CRS contributing; otherwise may need additional workup to evaluate hepatorenal syndrome, although cirrhosis appears compensated at this time  - avoid nephrotoxin   NASH cirrhosis Acute on chronic anemia Chronic thrombocytopenia Hyperbilirubinemia  Hypotension History of esophageal varices History of hepatic encephalopathy  Risk Assessment/Risk Scores:  {  New York Heart Association (NYHA) Functional Class NYHA Class III  CHA2DS2-VASc Score = 3  This indicates a 3.2% annual risk of stroke. [name] score is based upon: CHF History: 1 HTN History: 0 Diabetes History: 0 Stroke History: 0 Vascular Disease History: 0 Age Score: 1 Gender Score: 1   For questions or updates, please contact  [facility] Please consult  [email or web address] for contact info under    Signed, Cyndi Bender, NP  07/04/2023 4:26 PM  History and all data above reviewed.  Patient examined.  I agree with the findings as above.  The patient exam reveals   The patient unfortunately has had a complicated past medical history and recent medical history.  She said when she left the hospital the other day she was nauseated not feeling well.   She did not do well at home.  She was having some discomfort in her abdomen.  She was not able to eat and still nauseated.  She was very hypotensive when she came back to the hospital.  Her hemoglobin fell to 6.3 recorded earlier this morning.  She has had permanent atrial fibrillation now with rapid rate.  Blood pressures were low but did respond to fluid hydration.  She is also now status post blood transfusion.  She does however have evidence of volume overload on chest x-ray with mildly elevated BNP.  She has been more short of breath.  COR: Irregular rate and rhythm, 3 out of 6 apical systolic murmur radiating slightly at the outflow tract, no diastolic,  Lungs: Decreased breath sounds,  Abd: Positive bowel sounds normal frequency in pitch, Ext mild bilateral lower extremity edema.  All available labs, radiology testing, previous records reviewed. Agree with documented assessment  and plan.  Atrial fibrillation with RVR: Her ventricular rate is probably at appropriate given her acute blood loss anemia and comorbid condition.  I think the most important intervention is blood transfusion try to support her hemoglobin.  For tonight it is okay to continue the amiodarone.  However, she will be on this long-term given the fact that she is in permanent atrial fibrillation and not an anticoagulation candidate.  We can continue beta-blocker as her blood pressure allows and digoxin if her renal function improves.   Hopefully as her anemia corrects we will see improved rate control but I do suspect that she will run somewhat higher  than her baseline at home which is around 100 or little bit higher at rest.   She has had significant heart failure with preserved ejection fraction with elevated right heart pressures in the past no more recently they were improved.  We do have to be careful with her hydration and I agree with diuresis.   Again I suspect with correction of her anemia if she will tolerate decongestion with diuretics and renal function will slowly normalize.  Fayrene Fearing Jahnay Lantier  6:30 PM  07/04/2023

## 2023-07-04 NOTE — ED Notes (Signed)
ED TO INPATIENT HANDOFF REPORT  ED Nurse Name and Phone #: Marcello Moores [phone] Name/Age/Gender Hannah Khan 67 y.o. female Room/Bed: 025C/025C  Code Status   Code Status: Full Code  Home/SNF/Other Home Patient oriented to: self, place, time, and situation Is this baseline? Yes   Triage Complete: Triage complete  Chief Complaint Nausea and vomiting [R11.2] Nausea & vomiting [R11.2]  Triage Note Patient BIB EMS from home, Patient had stent in liver placed Friday. Generalized weakness with hypotension. 80/50, IVF came up 99/60. 20gLhand   Allergies Allergies  Allergen Reactions   Celebrex [Celecoxib] Other (See Comments)    "speech slurred" with celebrex   Shellfish Allergy Swelling and Rash    TINGLING AND SWELLING OF LIPS. LARGE WHELPS QUARTER-SIZE   Barium-Containing Compounds Other (See Comments)    TACHYCARDIA   Prednisone Other (See Comments)    TACHYCARDIA   Statins Other (See Comments)    AGGRAVATED FIBROMYALGIA   Fentanyl     Hallucinations    Ibuprofen Other (See Comments)    UNSPECIFIED SPECIFIC REACTION >> "DUE TO PLATELETS"   Sglt2 Inhibitors     Ozempic Severe gastroparesis    Tramadol     Hallucination    Tylenol [Acetaminophen] Other (See Comments)    UNSPECIFIED SPECIFIC REACTION >> "DUE TO PLATELETS"   Zetia [Ezetimibe]     Headache    Level of Care/Admitting Diagnosis ED Disposition     ED Disposition  Admit   Condition  --   Comment  Hospital Area: [facility] [[id number]]  Level of Care: Progressive [102]  Admit to Progressive based on following criteria: GI, ENDOCRINE disease patients with GI bleeding, acute liver failure or pancreatitis, stable with diabetic ketoacidosis or thyrotoxicosis (hypothyroid) state.  May admit patient to [facility] or [facility] if equivalent level of care is available:: No  Covid Evaluation: Asymptomatic - no recent exposure (last 10 days) testing not required  Diagnosis: Nausea &  vomiting [[id number]]  Admitting Physician: Tereasa Coop [[id number]]  Attending Physician: Tereasa Coop [[id number]]  Certification:: I certify this patient will need inpatient services for at least 2 midnights  Expected Medical Readiness: 07/10/2023          B Medical/Surgery History Past Medical History:  Diagnosis Date   Anemia    hx of   Aortic stenosis 02/2023   Echo 03/07/23 LVEF 55-60%, mild to moderate AS   Arthritis    knees   CHF (congestive heart failure) (HCC)    chronic diastolic   Diabetes mellitus without complication (HCC)    type 2   Dyspnea    Dysrhythmia    Fibromyalgia    Gastric varices    large   H/O transfusion of platelets    Heart murmur    never has caused any problems   Hx of colonic polyps    s/p partial colectomy   Hyperlipidemia    Hypertension    Joint pain    in knees and back spasms   Left leg swelling    wear compression hose   Liver cirrhosis secondary to NASH (nonalcoholic steatohepatitis) (HCC) dx nov 2014   had enlarged spleen also    Morbid obesity (HCC)    Persistent atrial fibrillation (HCC)    Pneumonia 10/2016   x 5   PONV (postoperative nausea and vomiting)    in past none recent   Portal hypertension (HCC)    Sleep apnea    uses cpap, setting varies between 14-20  Spleen enlarged    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 [facility])    Past Surgical History:  Procedure Laterality Date   BREAST LUMPECTOMY WITH RADIOACTIVE SEED LOCALIZATION Right 08/29/2017   Procedure: RIGHT BREAST LUMPECTOMY WITH RADIOACTIVE SEEDS LOCALIZATION;  Surgeon: Harriette Bouillon, MD;  Location: [facility] OR;  Service: General;  Laterality: Right;   CESAREAN SECTION  1989   CHOLECYSTECTOMY  20 yrs ago   COLECTOMY  2011   COLONOSCOPY     COLONOSCOPY N/A 09/05/2018   Procedure: COLONOSCOPY;  Surgeon: Willis Modena, MD;  Location: [facility] END[facility]COPY;  Service: Endoscopy;  Laterality: N/A;   COLONOSCOPY WITH PROPOFOL Bilateral 05/04/2022   Procedure:  COLONOSCOPY WITH PROPOFOL;  Surgeon: Willis Modena, MD;  Location: [facility]OS[facility];  Service: Gastroenterology;  Laterality: Bilateral;   DILATATION & CURETTAGE/HYSTEROSCOPY WITH MYOSURE N/A 05/06/2016   Procedure: DILATATION & CURETTAGE/HYSTEROSCOPY WITH MYOSURE WITH POLYPECTOMY;  Surgeon: Myna Hidalgo, DO;  Location: [facility] ORS;  Service: Gynecology;  Laterality: N/A;   ESOPHAGOGASTRODUODENOSCOPY (EGD) WITH PROPOFOL N/A 09/04/2013   Procedure: ESOPHAGOGASTRODUODENOSCOPY (EGD) WITH PROPOFOL;  Surgeon: Willis Modena, MD;  [facility];  Service: Endoscopy;  Laterality: N/A;   ESOPHAGOGASTRODUODENOSCOPY (EGD) WITH PROPOFOL Bilateral 05/04/2022   Procedure: ESOPHAGOGASTRODUODENOSCOPY (EGD) WITH PROPOFOL;  Surgeon: Willis Modena, MD;  [facility]: [facility];  Service: Gastroenterology;  Laterality: Bilateral;   IR EMBO ART  VEN HEMORR LYMPH EXTRAV  INC GUIDE ROADMAPPING  06/30/2023   IR INTRAVASCULAR ULTRASOUND NON CORONARY  12/23/2022   IR RADIOLOGIST EVAL & MGMT  09/21/2022   IR RADIOLOGIST EVAL & MGMT  10/06/2022   IR RADIOLOGIST EVAL & MGMT  11/02/2022   IR RADIOLOGIST EVAL & MGMT  01/31/2023   IR RADIOLOGIST EVAL & MGMT  05/31/2023   IR TIPS  12/23/2022   IR TIPS  06/30/2023   IR US GUIDE VASC ACCESS LEFT  06/30/2023   IR US GUIDE VASC ACCESS RIGHT  12/23/2022   IR US GUIDE VASC ACCESS RIGHT  06/30/2023   IR US GUIDE VASC ACCESS RIGHT  06/30/2023   KNEE ARTHROSCOPY  yrs ago   bilateral, one done x 1, one done twice   POLYPECTOMY  09/05/2018   Procedure: POLYPECTOMY;  Surgeon: Willis Modena, MD;  [facility];  Service: Endoscopy;;   RADIOLOGY WITH ANESTHESIA N/A 12/23/2022   Procedure: TIPS;  Surgeon: Bennie Dallas, MD;  Location: [facility] OR;  Service: Radiology;  Laterality: N/A;   RIGHT HEART CATH N/A 05/19/2023   Procedure: RIGHT HEART CATH;  Surgeon: Dorthula Nettles, DO;  Location: [facility] INVAS[facility] CV LAB;  Service: Cardiovascular;  Laterality: N/A;   TIPS PROCEDURE N/A 06/30/2023    Procedure: TRANS-JUGULAR INTRAHEPATIC PORTAL SHUNT (TIPS);  Surgeon: Bennie Dallas, MD;  Location: [facility] OR;  Service: Radiology;  Laterality: N/A;     A IV Location/Drains/Wounds Patient Lines/Drains/Airways Status     Active Line/Drains/Airways     Name Placement date Placement time Site Days   Peripheral IV 07/03/23 20 G Posterior;Left Hand 07/03/23  1312  Hand  1   Peripheral IV 07/04/23 20 G Anterior;Right Forearm 07/04/23  0508  Forearm  less than 1   Wound / Incision (Open or Dehisced) 12/23/22 Puncture Neck Right Venous access for TIPS 12/23/22  1050  Neck  193   Wound / Incision (Open or Dehisced) 12/23/22 Puncture Groin Anterior;Proximal;Right Venous access for TIPS creation 12/23/22  1050  Groin  193            Intake/Output Last 24 hours  Intake/Output Summary (Last  24 hours) at 07/04/2023 0801 Last data filed at 07/03/2023 2058 Gross per 24 hour  Intake 200 ml  Output --  Net 200 ml    Labs/Imaging Results for orders placed or performed during the hospital encounter of 07/03/23 (from the past 48 hour(s))  CBC     Status: Abnormal   Collection Time: 07/03/23  1:07 PM  Result Value Ref Range   WBC 6.6 4.0 - 10.5 K/uL   RBC 2.17 (L) 3.87 - 5.11 MIL/uL   Hemoglobin 7.3 (L) 12.0 - 15.0 g/dL   HCT 25.3 (L) 66.4 - 40.3 %   MCV 100.9 (H) 80.0 - 100.0 fL   MCH 33.6 26.0 - 34.0 pg   MCHC 33.3 30.0 - 36.0 g/dL   RDW 47.4 (H) 25.9 - 56.3 %   Platelets 46 (L) 150 - 400 K/uL    Comment: Immature Platelet Fraction may be clinically indicated, consider ordering this additional test [id number] CONSISTENT WITH PREVIOUS RESULT REPEATED TO VERIFY    nRBC 1.5 (H) 0.0 - 0.2 %    Comment: Performed at [facility], [address] [place], [place] [address]  Basic metabolic panel     Status: Abnormal   Collection Time: 07/03/23  1:07 PM  Result Value Ref Range   Sodium 136 135 - 145 mmol/L   Potassium 3.6 3.5 - 5.1 mmol/L   Chloride 103 98 - 111 mmol/L   CO2 21  (L) 22 - 32 mmol/L   Glucose, Bld 205 (H) 70 - 99 mg/dL    Comment: Glucose reference range applies only to samples taken after fasting for at least 8 hours.   BUN 32 (H) 8 - 23 mg/dL   Creatinine, Ser 4.16 (H) 0.44 - 1.00 mg/dL   Calcium 8.4 (L) 8.9 - 10.3 mg/dL   GFR, Estimated 29 (L) >60 mL/min    Comment: (NOTE) Calculated using the CKD-EPI Creatinine Equation (2021)    Anion gap 12 5 - 15    Comment: Performed at [facility] Lab, [address] [place], [place] [address]  Troponin I (High Sensitivity)     Status: Abnormal   Collection Time: 07/03/23  1:07 PM  Result Value Ref Range   Troponin I (High Sensitivity) 41 (H) <18 ng/L    Comment: (NOTE) Elevated high sensitivity troponin I (hsTnI) values and significant  changes across serial measurements may suggest ACS but many other  chronic and acute conditions are known to elevate hsTnI results.  Refer to the "Links" section for chest pain algorithms and additional  guidance. Performed at [facility], [address], [place], [place] [address]   Hepatic function panel     Status: Abnormal   Collection Time: 07/03/23  3:00 PM  Result Value Ref Range   Total Protein 5.8 (L) 6.5 - 8.1 g/dL   Albumin 2.6 (L) 3.5 - 5.0 g/dL   AST 932 (H) 15 - 41 U/L   ALT 59 (H) 0 - 44 U/L   Alkaline Phosphatase 127 (H) 38 - 126 U/L   Total Bilirubin 5.2 (H) 0.3 - 1.2 mg/dL   Bilirubin, Direct 1.4 (H) 0.0 - 0.2 mg/dL   Indirect Bilirubin 3.8 (H) 0.3 - 0.9 mg/dL    Comment: Performed at [facility], [address] [place], [place] [address]  Protime-INR     Status: Abnormal   Collection Time: 07/03/23  3:00 PM  Result Value Ref Range   Prothrombin Time 16.5 (H) 11.4 -  15.2 seconds   INR 1.3 (H) 0.8 - 1.2    Comment: (NOTE) INR goal varies based on device and disease states. Performed at [facility] Lab, [address] [place], [place] [address]   Lipase, blood     Status: None   Collection Time: 07/03/23  3:00 PM   Result Value Ref Range   Lipase 33 11 - 51 U/L    Comment: Performed at [facility], [address], [place], [place] [address]  Troponin I (High Sensitivity)     Status: Abnormal   Collection Time: 07/03/23  3:08 PM  Result Value Ref Range   Troponin I (High Sensitivity) 50 (H) <18 ng/L    Comment: (NOTE) Elevated high sensitivity troponin I (hsTnI) values and significant  changes across serial measurements may suggest ACS but many other  chronic and acute conditions are known to elevate hsTnI results.  Refer to the "Links" section for chest pain algorithms and additional  guidance. Performed at [facility], [address], [place], [place] [address]   I-Stat Lactic Acid     Status: None   Collection Time: 07/03/23 11:04 PM  Result Value Ref Range   Lactic Acid, Venous 1.6 0.5 - 1.9 mmol/L  Hemoglobin A1c     Status: None   Collection Time: 07/04/23  1:57 AM  Result Value Ref Range   Hgb A1c MFr Bld 5.2 4.8 - 5.6 %    Comment: (NOTE) Pre diabetes:          5.7%-6.4%  Diabetes:              >6.4%  Glycemic control for   <7.0% adults with diabetes    Mean Plasma Glucose 102.54 mg/dL    Comment: Performed at [facility], [address], [place], [place] [address]  CBC     Status: Abnormal   Collection Time: 07/04/23  1:57 AM  Result Value Ref Range   WBC 4.3 4.0 - 10.5 K/uL   RBC 1.92 (L) 3.87 - 5.11 MIL/uL   Hemoglobin 6.3 (LL) 12.0 - 15.0 g/dL    Comment: REPEATED TO VERIFY THIS CRITICAL RESULT HAS VERIFIED AND BEEN CALLED TO M. BOWERSOCK,RN BY JEZREEL LAS IGAN ON 10 15 2024 AT 0245, AND HAS BEEN READ BACK.     HCT 19.8 (L) 36.0 - 46.0 %   MCV 103.1 (H) 80.0 - 100.0 fL   MCH 32.8 26.0 - 34.0 pg   MCHC 31.8 30.0 - 36.0 g/dL   RDW 29.5 (H) 62.1 - 30.8 %   Platelets 24 (LL) 150 - 400 K/uL    Comment: Immature Platelet Fraction may be clinically indicated, consider ordering this additional test [id number] CONSISTENT WITH PREVIOUS RESULT REPEATED  TO VERIFY THIS CRITICAL RESULT HAS VERIFIED AND BEEN CALLED TO M. BOWERSOCK,RN BY JEZREEL LAS IGAN ON 10 15 2024 AT 0245, AND HAS BEEN READ BACK.     nRBC 0.7 (H) 0.0 - 0.2 %    Comment: Performed at [facility], [address], [place], [place] [address]  Comprehensive metabolic panel     Status: Abnormal   Collection Time: 07/04/23  1:57 AM  Result Value Ref Range   Sodium 135 135 - 145 mmol/L   Potassium 3.8 3.5 - 5.1 mmol/L   Chloride 103 98 - 111 mmol/L   CO2 22 22 - 32 mmol/L   Glucose, Bld 182 (H) 70 - 99 mg/dL    Comment: Glucose  reference range applies only to samples taken after fasting for at least 8 hours.   BUN 34 (H) 8 - 23 mg/dL   Creatinine, Ser 1.61 (H) 0.44 - 1.00 mg/dL   Calcium 8.5 (L) 8.9 - 10.3 mg/dL   Total Protein 5.5 (L) 6.5 - 8.1 g/dL   Albumin 2.3 (L) 3.5 - 5.0 g/dL   AST 83 (H) 15 - 41 U/L   ALT 52 (H) 0 - 44 U/L   Alkaline Phosphatase 123 38 - 126 U/L   Total Bilirubin 5.2 (H) 0.3 - 1.2 mg/dL   GFR, Estimated 26 (L) >60 mL/min    Comment: (NOTE) Calculated using the CKD-EPI Creatinine Equation (2021)    Anion gap 10 5 - 15    Comment: Performed at [facility], [address], [place], [place] [address]  Type and screen [facility]     Status: None (Preliminary result)   Collection Time: 07/04/23  1:57 AM  Result Value Ref Range   ABO/RH(D) O POS    Antibody Screen NEG    Sample Expiration 07/07/2023,2359    Unit Number [id number]    Blood Component Type RED CELLS,LR    Unit division 00    Status of Unit ISSUED    Transfusion Status OK TO TRANSFUSE    Crossmatch Result COMPATIBLE    Unit Number [id number]    Blood Component Type RED CELLS,LR    Unit division 00    Status of Unit ALLOCATED    Transfusion Status OK TO TRANSFUSE    Crossmatch Result COMPATIBLE   Troponin I (High Sensitivity)     Status: Abnormal   Collection Time: 07/04/23  1:57 AM  Result Value Ref Range   Troponin I (High Sensitivity) 38  (H) <18 ng/L    Comment: (NOTE) Elevated high sensitivity troponin I (hsTnI) values and significant  changes across serial measurements may suggest ACS but many other  chronic and acute conditions are known to elevate hsTnI results.  Refer to the "Links" section for chest pain algorithms and additional  guidance. Performed at [facility], [address], [place], [place] [address]   Brain natriuretic peptide     Status: Abnormal   Collection Time: 07/04/23  1:57 AM  Result Value Ref Range   B Natriuretic Peptide 531.6 (H) 0.0 - 100.0 pg/mL    Comment: Performed at [facility], [address] [place], [place] [address]  I-Stat Lactic Acid     Status: Abnormal   Collection Time: 07/04/23  2:13 AM  Result Value Ref Range   Lactic Acid, Venous 2.0 (HH) 0.5 - 1.9 mmol/L   Comment NOTIFIED PHYSICIAN   Prepare RBC (crossmatch)     Status: None   Collection Time: 07/04/23  4:06 AM  Result Value Ref Range   Order Confirmation      ORDER PROCESSED BY BLOOD BANK Performed at [facility], [address] [place], [place] [address]   Urinalysis, Routine w reflex microscopic -Urine, Clean Catch     Status: Abnormal   Collection Time: 07/04/23  6:09 AM  Result Value Ref Range   Color, Urine AMBER (A) YELLOW    Comment: BIOCHEMICALS MAY BE AFFECTED BY COLOR   APPearance HAZY (A) CLEAR   Specific Gravity, Urine 1.013 1.005 - 1.030   pH 5.0 5.0 - 8.0   Glucose, UA NEGATIVE NEGATIVE mg/dL   Hgb urine dipstick NEGATIVE NEGATIVE   Bilirubin Urine NEGATIVE  NEGATIVE   Ketones, ur NEGATIVE NEGATIVE mg/dL   Protein, ur NEGATIVE NEGATIVE mg/dL   Nitrite NEGATIVE NEGATIVE   Leukocytes,Ua NEGATIVE NEGATIVE    Comment: Performed at [facility], [address], [place], [place] [address]  MRSA Next Gen by PCR, Nasal     Status: None   Collection Time: 07/04/23  6:10 AM   Specimen: Nasal Mucosa; Nasal Swab  Result Value Ref Range   MRSA by PCR Next Gen NOT DETECTED NOT  DETECTED    Comment: (NOTE) The GeneXpert MRSA Assay (FDA approved for NASAL specimens only), is one component of a comprehensive MRSA colonization surveillance program. It is not intended to diagnose MRSA infection nor to guide or monitor treatment for MRSA infections. Test performance is not FDA approved in patients less than 67 years old. Performed at [facility], [address], [place], [place] [address]    DG Chest Port 1 View  Result Date: 07/04/2023 CLINICAL DATA:  Possible hypoxia EXAM: PORTABLE CHEST 1 VIEW COMPARISON:  07/01/2023 FINDINGS: Cardiomegaly with vascular congestion and diffuse increased interstitial opacity suspicious for edema. Suspected airspace disease at the left base. Aortic atherosclerosis. IMPRESSION: Cardiomegaly with vascular congestion and interstitial edema increased compared to prior radiograph. Difficult to exclude left effusion or left basilar airspace disease Electronically Signed   By: Jasmine Pang M.D.   On: 07/04/2023 00:08   CT ABDOMEN PELVIS WO CONTRAST  Result Date: 07/03/2023 CLINICAL DATA:  Abdominal pain. Generalized weakness and hypotension. Hemoperitoneum EXAM: CT ABDOMEN AND PELVIS WITHOUT CONTRAST TECHNIQUE: Multidetector CT imaging of the abdomen and pelvis was performed following the standard protocol without IV contrast. RADIATION DOSE REDUCTION: This exam was performed according to the departmental dose-optimization program which includes automated exposure control, adjustment of the mA and/or kV according to patient size and/or use of iterative reconstruction technique. COMPARISON:  CT abdomen and pelvis without contrast 07/01/23 FINDINGS: Lower chest: Left greater than right pleural effusions are stable. Mild atelectasis is present at the left base. The heart is enlarged. Blood chambers are hypodense, consistent with anemia. Hepatobiliary: Nodular irregularity is present within the liver. No discrete lesions are present.  Hemoperitoneum along the anterior margin of the liver is decreased in size compared to the prior exam. Cholecystectomy noted. No significant intra or extrahepatic biliary dilation is present. Pancreas: Unremarkable. No pancreatic ductal dilatation or surrounding inflammatory changes. Spleen: Splenomegaly again noted. Adrenals/Urinary Tract: The adrenal glands are normal bilaterally. Bilateral simple cysts are stable. The ureters are within normal limits. The urinary bladder is mostly collapsed. Stomach/Bowel: The stomach and duodenum are within normal limits. Small bowel is unremarkable. Terminal ileum is normal. Appendectomy noted. Ascending and transverse colon within normal limits. Descending and sigmoid colon are normal. Vascular/Lymphatic: Scattered atherosclerotic calcifications are present within the aorta and branch vessels. No aneurysm is present. Cavernous transformation of the portal vein is again noted. Aneurysm coils are present. No significant adenopathy is present. Reproductive: IUD remains. Uterus and adnexa are otherwise within normal limits. Other: Hemoperitoneum extending into the pelvis is decreased compared to the prior exam. No new hemorrhage is present. Diffuse subcutaneous edema is present. No free air is present. Musculoskeletal: Mild degenerative changes are again noted in the thoracolumbar spine. No focal osseous lesions are present. Bony pelvis is normal. The hips are located and within normal limits bilaterally. IMPRESSION: 1. Decreased hemoperitoneum compared to the prior exam. 2. Stable left greater than right pleural effusions. 3. Stable cardiomegaly. 4. Anemia. 5. Stable splenomegaly. 6.  Stable simple cysts of both kidneys. 7. Cavernous transformation of the portal vein. 8.  Aortic Atherosclerosis (ICD10-I70.0). Electronically Signed   By: Marin Roberts M.D.   On: 07/03/2023 21:59    Pending Labs Unresulted Labs (From admission, onward)     Start     Ordered   07/05/23  0500  Comprehensive metabolic panel  Daily,   R      07/04/23 0609   07/05/23 0500  CBC  Daily,   R      07/04/23 0609   07/04/23 1100  Hemoglobin and hematocrit, blood  3 times daily,   R (with TIMED occurrences)      07/04/23 0412   07/04/23 0600  Lactic acid, plasma  (Lactic Acid)  STAT Now then every 3 hours,   R (with STAT occurrences)      07/04/23 0405   07/04/23 0410  APTT  Add-on,   AD        07/04/23 0409   07/04/23 0410  Protime-INR  Add-on,   AD        07/04/23 0409   07/04/23 0409  Expectorated Sputum Assessment w Gram Stain, Rflx to Resp Cult  Once,   R       Question Answer Comment  Patient immune status Immunocompromised   Release to patient Immediate      07/04/23 0408   07/04/23 0012  Culture, blood (Routine X 2) w Reflex to ID Panel  BLOOD CULTURE X 2,   R (with TIMED occurrences)      07/04/23 0011            Vitals/Pain Today's Vitals   07/04/23 0543 07/04/23 0603 07/04/23 0700 07/04/23 0740  BP: (!) 88/54 137/61 106/76 120/63  Pulse: (!) 121 (!) 105 (!) 108 (!) 112  Resp: (!) 24 (!) 24 18 (!) 21  Temp: 98.7 F (37.1 C) 98.2 F (36.8 C)  98.1 F (36.7 C)  TempSrc: Oral Oral  Oral  SpO2: 100% 100% 98% 100%  PainSc:        Isolation Precautions No active isolations  Medications Medications  midodrine (PROAMATINE) tablet 10 mg (10 mg Oral Given 07/04/23 0722)  oxyCODONE (Oxy IR/ROXICODONE) immediate release tablet 5 mg (has no administration in time range)  naloxone (NARCAN) injection 0.4 mg (has no administration in time range)  rifaximin (XIFAXAN) tablet 550 mg (550 mg Oral Given 07/04/23 0140)  insulin aspart (novoLOG) injection 0-9 Units (2 Units Subcutaneous Given 07/04/23 0519)  ondansetron (ZOFRAN) tablet 4 mg (has no administration in time range)    Or  ondansetron (ZOFRAN) injection 4 mg (has no administration in time range)  lactulose (CHRONULAC) 10 GM/15ML solution 20 g (20 g Oral Given 07/04/23 0428)  amiodarone (NEXTERONE PREMIX)  360-4.14 MG/200ML-% (1.8 mg/mL) IV infusion (60 mg/hr Intravenous New Bag/Given 07/04/23 0601)    Followed by  amiodarone (NEXTERONE PREMIX) 360-4.14 MG/200ML-% (1.8 mg/mL) IV infusion (has no administration in time range)  albumin human 25 % solution 50 g (has no administration in time range)  lactated ringers infusion ( Intravenous New Bag/Given 07/04/23 0736)  ceFEPIme (MAXIPIME) 2 g in sodium chloride 0.9 % 100 mL IVPB (2 g Intravenous New Bag/Given 07/04/23 0740)  vancomycin (VANCOREADY) IVPB 2000 mg/400 mL (has no administration in time range)  vancomycin (VANCOREADY) IVPB 1500 mg/300 mL (has no administration in time range)  digoxin (LANOXIN) tablet 0.125 mg (0.125 mg Oral Given 07/04/23 0721)  0.9 %  sodium chloride infusion (0 mLs Intravenous Stopped  07/03/23 1405)  ondansetron (ZOFRAN) injection 4 mg (4 mg Intravenous Given 07/03/23 1352)  sodium chloride 0.9 % bolus 500 mL (0 mLs Intravenous Stopped 07/03/23 1605)  pantoprazole (PROTONIX) injection 40 mg (40 mg Intravenous Given 07/03/23 1453)  oxyCODONE (Oxy IR/ROXICODONE) immediate release tablet 5 mg (5 mg Oral Given 07/03/23 1454)  lactated ringers bolus 1,000 mL (0 mLs Intravenous Stopped 07/04/23 0731)  amiodarone (NEXTERONE) 1.8 mg/mL load via infusion 150 mg (150 mg Intravenous Bolus from Bag 07/04/23 0508)  0.9 %  sodium chloride infusion (Manually program via Guardrails IV Fluids) (0 mLs Intravenous Stopped 07/04/23 0735)    Mobility walks with device     Focused Assessments Cardiac Assessment Handoff:    Lab Results  Component Value Date   TROPONINI <0.03 04/26/2018   Lab Results  Component Value Date   DDIMER 2.64 (H) 03/16/2020   Does the Patient currently have chest pain? No    R Recommendations: See Admitting Provider Note  Report given to:   Additional Notes:

## 2023-07-04 NOTE — Progress Notes (Addendum)
HOSPITALIST ROUNDING NOTE [facility] DGL:[id number]  DOB: 02/14/1956  DOA: 07/03/2023  PCP: Docia Chuck, Dibas, MD  07/04/2023,6:56 AM   LOS: 0 days      Code Status: Full From: Home  current Dispo: Unclear    67 year old white female Known atrial fibrillation CHADVASC >4, has bled score >2 not on anticoagulation 2/2 thrombocytopenia--follows with advanced heart failure team as well as Dr. Chilton Si for her heart issues NASH cirrhosis/chronic hypotension diagnosed 2009 failed TIPS/2024/Xifaxan lactulose with complication of pancytopenia at baseline Prior admission 02/2023 abdominal wall panniculitis colitis-subsequent readmission 03/2023 for nausea vomiting gastroenteritis as well as recurrent cellulitis CKD 3B DM TY 2 A1c 5 range 10/2022  Recent readmission 10/11--10/13 attempt TIPS procedure--balloon obliteration of gastric varix-TIPS still unsuccessful-developed hemoperitoneum-required pressor support subsequently rate controlled meds resumed  Re-presented 10/14 nausea vomiting hypotension BP 80/50 RVR 130s WBC 6 baseline hemoglobin 7.3 platelet 46 EKG A-fib no ischemia CT ABD decrease in hemoperitoneum stable effusions Patient bolused IV fluids  Early a.m. 10/15 patient developed hypoxia desats 85% 2 L- VQ scan pending to rule out PE- resuscitation albumin midodrine increased to 10 3 times daily  Stopped IV fluid this morning as well as holding any further boluses  2 units PRBC typed and screened to be transfused   Plan  Hypotension-multifactorial resolved resuscitated with albumin IV fluid-keep saline lock now giving 2 units PRBC Feel combination of nausea as well as potentially missed doses of midodrine although she says she took them Increase midodrine to 20 3 times daily blood pressure stable now\ VQ scan being done to rule out PE --[in my mind low suspicion for PE I think she is short of breath because she got volume overloaded]  Volume overload in the setting of  cirrhosis HFpEF 55-60 cannot determine diastolic function-BNP elevated 531 range-right heart cath mild elevated pressures back in 05/31/2023 Probable cardiorenal syndrome Home weight about 320 by her report-here she is 330  Diuretics as above cautiously, will need input from heart failure team Hold metolazone Unclear why not on Aldactone--may need to add this for splanchnic mobilization of fluid TED hose added May need abdominal binder if can tolerate  Macrocytic anemia secondary to cirrhosis--no acute blood loss anemia at this stage Hemoglobin dropped to 6.3-has thrombocytopenia additionally in the 24 range no overt bleeding 2 units PRBC now-if platelets remain below 20 will need platelet transfusion  Prior history of hemoperitoneum CT abdomen pelvis 10/14 showed decrease hemoperitoneum with no new hemorrhage Giving blood as above  Complicated cirrhosis history failed TIPS procedure and was unable to perform the same recent admission 10/11 to 10/13-MELD score22 with 20% 25-month mortality Would start Lasix 40 mg this evening 1400 and if in a.m. is maintaining pressures etc. can place back on home regimen [Demadex 80/60] with higher dose of midodrine as above ?  Aldactone-resume rifaximin, resume lactulose 20 3 times daily she is not encephalopathic [facility] aware of patient no need for consult--appreciate of of IR input for hemoperitoneum although not sure if there is anything to add from their perspective  Severe thrombocytopenia Monitor trends transfuse platelets if below 20 and/or bleeding  A-fib RVR with hypotension, A-fib diagnosed 2021--not a candidate for anticoagulation secondary to TCP Hypotension as above unable to really give her home meds other than the digoxin 0.125 I have resumed metoprolol 12.5 twice daily cautiously We will try and switch off the amiodarone drip and placed on Cardizem 60 3 times daily---I will ask heart failure team to come by and  give an opinion  AKI  imposed on CKD 2 Diuretics held as above but now developing effusions See above discussion  Cellulitis Underwhelming-switch to Keflex for 2 more days-I do not think she is septic   DM TY 2 Keep on sliding scale coverage at this time-apparently not on home meds   DVT prophylaxis: SCDs only boots as platelets are low  Status is: Inpatient Remains inpatient appropriate because:   Needs further complex management  Subjective: Several episodes of nausea after being admitted recently Was able to tolerate a sandwich in the ED this morning No real vomiting other than once or twice has not had that at all here No dark stool no tarry stool Her abdomen is distended but does not have significant pain  Objective + exam Vitals:   07/04/23 0200 07/04/23 0420 07/04/23 0543 07/04/23 0603  BP:  (!) 99/57 (!) 88/54 137/61  Pulse: (!) 125 (!) 120 (!) 121 (!) 105  Resp: 18 20 (!) 24 (!) 24  Temp:  98.5 F (36.9 C) 98.7 F (37.1 C) 98.2 F (36.8 C)  TempSrc:   Oral Oral  SpO2: 97% 95% 100% 100%   There were no vitals filed for this visit.  Examination:  Awake coherent on oxygen Thick neck Mallampati 4 ROM intact no focal deficit Edematous up to groin cannot appreciate JVD some rales rhonchi S1-S2 A-fib Neurologically intact  Data Reviewed: reviewed   CBC    Component Value Date/Time   WBC 4.3 07/04/2023 0157   RBC 1.92 (L) 07/04/2023 0157   HGB 6.3 (LL) 07/04/2023 0157   HGB 14.5 12/12/2022 0942   HGB 12.6 08/25/2017 1418   HCT 19.8 (L) 07/04/2023 0157   HCT 42.2 12/12/2022 0942   HCT 37.3 08/25/2017 1418   PLT 24 (LL) 07/04/2023 0157   PLT Comment 12/12/2022 0942   MCV 103.1 (H) 07/04/2023 0157   MCV 93 12/12/2022 0942   MCV 91.2 08/25/2017 1418   MCH 32.8 07/04/2023 0157   MCHC 31.8 07/04/2023 0157   RDW 16.0 (H) 07/04/2023 0157   RDW 14.7 12/12/2022 0942   RDW 14.8 (H) 08/25/2017 1418   LYMPHSABS 0.2 (L) 04/06/2023 0354   LYMPHSABS 0.8 11/16/2021 1111    LYMPHSABS 0.7 (L) 08/25/2017 1418   MONOABS 0.4 04/06/2023 0354   MONOABS 0.3 08/25/2017 1418   EOSABS 0.0 04/06/2023 0354   EOSABS 0.1 11/16/2021 1111   BASOSABS 0.0 04/06/2023 0354   BASOSABS 0.0 11/16/2021 1111   BASOSABS 0.0 08/25/2017 1418      Latest Ref Rng & Units 07/04/2023    1:57 AM 07/03/2023    3:00 PM 07/03/2023    1:07 PM  CMP  Glucose 70 - 99 mg/dL 956   213   BUN 8 - 23 mg/dL 34   32   Creatinine 0.86 - 1.00 mg/dL 5.78   4.69   Sodium 629 - 145 mmol/L 135   136   Potassium 3.5 - 5.1 mmol/L 3.8   3.6   Chloride 98 - 111 mmol/L 103   103   CO2 22 - 32 mmol/L 22   21   Calcium 8.9 - 10.3 mg/dL 8.5   8.4   Total Protein 6.5 - 8.1 g/dL 5.5  5.8    Total Bilirubin 0.3 - 1.2 mg/dL 5.2  5.2    Alkaline Phos 38 - 126 U/L 123  127    AST 15 - 41 U/L 83  105    ALT 0 - 44 U/L  52  59       Scheduled Meds:  digoxin  0.125 mg Oral Daily   insulin aspart  0-9 Units Subcutaneous Q4H   lactulose  20 g Oral TID   midodrine  10 mg Oral TID WC   rifaximin  550 mg Oral BID   Continuous Infusions:  albumin human     amiodarone 60 mg/hr (07/04/23 0601)   Followed by   amiodarone     ceFEPime (MAXIPIME) IV     lactated ringers     [START ON 07/05/2023] vancomycin     vancomycin      Time  70  Rhetta Mura, MD  [facility]

## 2023-07-04 NOTE — Progress Notes (Signed)
Pharmacy Antibiotic Note  Hannah Khan is a 67 y.o. female admitted on 07/03/2023 with sepsis of unknown source.  Pharmacy has been consulted for cefepime and vancomycin dosing.  Plan: Cefepime 2g q12h Vancomycin 2g x 1 load followed by vancomycin 1500mg  q24h (eAUC 517, Scr 2.03) F/u renal function, micro data to narrow and length of therapy Vancomycin labs as needed    Temp (24hrs), Avg:98.6 F (37 C), Min:98.3 F (36.8 C), Max:98.9 F (37.2 C)  Recent Labs  Lab 07/01/23 0300 07/01/23 1653 07/02/23 0945 07/03/23 1307 07/03/23 2304 07/04/23 0157 07/04/23 0213  WBC 8.6 10.3 6.6 6.6  --  4.3  --   CREATININE 1.97* 2.25* 1.97* 1.87*  --  2.03*  --   LATICACIDVEN  --   --   --   --  1.6  --  2.0*    Estimated Creatinine Clearance: 41.2 mL/min (A) (by C-G formula based on SCr of 2.03 mg/dL (H)).    Allergies  Allergen Reactions   Celebrex [Celecoxib] Other (See Comments)    "speech slurred" with celebrex   Shellfish Allergy Swelling and Rash    TINGLING AND SWELLING OF LIPS. LARGE WHELPS QUARTER-SIZE   Barium-Containing Compounds Other (See Comments)    TACHYCARDIA   Prednisone Other (See Comments)    TACHYCARDIA   Statins Other (See Comments)    AGGRAVATED FIBROMYALGIA   Fentanyl     Hallucinations    Ibuprofen Other (See Comments)    UNSPECIFIED SPECIFIC REACTION >> "DUE TO PLATELETS"   Sglt2 Inhibitors     Ozempic Severe gastroparesis    Tramadol     Hallucination    Tylenol [Acetaminophen] Other (See Comments)    UNSPECIFIED SPECIFIC REACTION >> "DUE TO PLATELETS"   Zetia [Ezetimibe]     Headache    Antimicrobials this admission: Cefepime 10/15 > Vancomycin 10/15 >  Microbiology results: 10/15 BCx: pending 10/15 MRSA nares: pending  10/15 resp cx: pending   Thank you for allowing pharmacy to be a part of this patient's care.  Marja Kays 07/04/2023 4:27 AM

## 2023-07-05 DIAGNOSIS — I4891 Unspecified atrial fibrillation: Secondary | ICD-10-CM

## 2023-07-05 DIAGNOSIS — D696 Thrombocytopenia, unspecified: Secondary | ICD-10-CM | POA: Diagnosis not present

## 2023-07-05 DIAGNOSIS — I5031 Acute diastolic (congestive) heart failure: Secondary | ICD-10-CM | POA: Diagnosis not present

## 2023-07-05 DIAGNOSIS — I5032 Chronic diastolic (congestive) heart failure: Secondary | ICD-10-CM | POA: Diagnosis not present

## 2023-07-05 DIAGNOSIS — K7469 Other cirrhosis of liver: Secondary | ICD-10-CM | POA: Diagnosis not present

## 2023-07-05 LAB — COMPREHENSIVE METABOLIC PANEL
ALT: 43 U/L (ref 0–44)
AST: 56 U/L — ABNORMAL HIGH (ref 15–41)
Albumin: 2.6 g/dL — ABNORMAL LOW (ref 3.5–5.0)
Alkaline Phosphatase: 112 U/L (ref 38–126)
Anion gap: 12 (ref 5–15)
BUN: 28 mg/dL — ABNORMAL HIGH (ref 8–23)
CO2: 22 mmol/L (ref 22–32)
Calcium: 8.7 mg/dL — ABNORMAL LOW (ref 8.9–10.3)
Chloride: 101 mmol/L (ref 98–111)
Creatinine, Ser: 1.87 mg/dL — ABNORMAL HIGH (ref 0.44–1.00)
GFR, Estimated: 29 mL/min — ABNORMAL LOW (ref >60)
Glucose, Bld: 167 mg/dL — ABNORMAL HIGH (ref 70–99)
Potassium: 3.6 mmol/L (ref 3.5–5.1)
Sodium: 135 mmol/L (ref 135–145)
Total Bilirubin: 4.8 mg/dL — ABNORMAL HIGH (ref 0.3–1.2)
Total Protein: 5.6 g/dL — ABNORMAL LOW (ref 6.5–8.1)

## 2023-07-05 LAB — PREPARE RBC (CROSSMATCH)

## 2023-07-05 LAB — GLUCOSE, CAPILLARY
Glucose-Capillary: 160 mg/dL — ABNORMAL HIGH (ref 70–99)
Glucose-Capillary: 183 mg/dL — ABNORMAL HIGH (ref 70–99)
Glucose-Capillary: 145 mg/dL — ABNORMAL HIGH (ref 70–99)
Glucose-Capillary: 117 mg/dL — ABNORMAL HIGH (ref 70–99)
Glucose-Capillary: 127 mg/dL — ABNORMAL HIGH (ref 70–99)

## 2023-07-05 LAB — CBC
HCT: 22.9 % — ABNORMAL LOW (ref 36.0–46.0)
Hemoglobin: 7.7 g/dL — ABNORMAL LOW (ref 12.0–15.0)
MCH: 34.1 pg — ABNORMAL HIGH (ref 26.0–34.0)
MCHC: 33.6 g/dL (ref 30.0–36.0)
MCV: 101.3 fL — ABNORMAL HIGH (ref 80.0–100.0)
Platelets: 22 10*3/uL — CL (ref 150–400)
RBC: 2.26 MIL/uL — ABNORMAL LOW (ref 3.87–5.11)
RDW: 17.2 % — ABNORMAL HIGH (ref 11.5–15.5)
WBC: 4.2 10*3/uL (ref 4.0–10.5)
nRBC: 0 % (ref 0.0–0.2)

## 2023-07-05 MED ORDER — PHYTONADIONE 5 MG PO TABS
5.0000 mg | ORAL_TABLET | Freq: Every day | ORAL | Status: AC
  Administered 2023-07-05 – 2023-07-07 (×3): 5 mg via ORAL
  Filled 2023-07-05 (×3): qty 1

## 2023-07-05 MED ORDER — FUROSEMIDE 10 MG/ML IJ SOLN: 40.0000 mg | Freq: Two times a day (BID) | INTRAMUSCULAR | Status: DC

## 2023-07-05 MED ORDER — SODIUM CHLORIDE 0.9% IV SOLUTION: Freq: Once | INTRAVENOUS | Status: AC

## 2023-07-05 MED ORDER — ALBUMIN HUMAN 25 % IV SOLN
25.0000 g | Freq: Four times a day (QID) | INTRAVENOUS | Status: AC
  Administered 2023-07-05 – 2023-07-07 (×8): 25 g via INTRAVENOUS
  Filled 2023-07-05 (×8): qty 100

## 2023-07-05 MED ORDER — LIDOCAINE 5 % EX PTCH
2.0000 | MEDICATED_PATCH | CUTANEOUS | Status: DC
  Administered 2023-07-05 – 2023-07-10 (×6): 2 via TRANSDERMAL
  Filled 2023-07-05 (×6): qty 2

## 2023-07-05 MED ORDER — FUROSEMIDE 10 MG/ML IJ SOLN
40.0000 mg | Freq: Two times a day (BID) | INTRAMUSCULAR | Status: DC
  Administered 2023-07-06 – 2023-07-07 (×4): 40 mg via INTRAVENOUS
  Filled 2023-07-05 (×4): qty 4

## 2023-07-05 MED ORDER — POTASSIUM CHLORIDE CRYS ER 20 MEQ PO TBCR
40.0000 meq | EXTENDED_RELEASE_TABLET | Freq: Once | ORAL | Status: AC
  Administered 2023-07-05: 40 meq via ORAL
  Filled 2023-07-05: qty 2

## 2023-07-05 MED ORDER — SPIRONOLACTONE 25 MG PO TABS
25.0000 mg | ORAL_TABLET | Freq: Every day | ORAL | Status: DC
  Administered 2023-07-05 – 2023-07-07 (×3): 25 mg via ORAL
  Filled 2023-07-05 (×3): qty 1

## 2023-07-05 NOTE — Progress Notes (Signed)
PROGRESS NOTE    Hannah Khan  URK:[id number] DOB: 1955/12/20 DOA: 07/03/2023 PCP: Darrow Bussing, MD    Chief Complaint  Patient presents with   Hypotension    Brief Narrative:   Hannah Khan is a 67 y.o. female with medical history significant of A-fib not on anticoagulation, CHF, hypertension, type 2 diabetes, CKD stage IIIb, Nash cirrhosis, gastroesophageal varices, chronic thrombocytopenia, anemia, abdominal wall cellulitis, severe morbid obesity. Recently admitted for TIPS creation on 06/30/2023 which was unsuccessful but they were able to perform balloon occlusion retrograde transvenous obliteration of the gastric varix. Postprocedure she developed hypotension requiring pressor support. Also developed A-fib with RVR treated with oral doses of metoprolol and Cardizem. Hospital course further complicated by acute blood loss anemia due to small to moderate volume hemoperitoneum. She was discharged from the hospital yesterday.   Patient presents to the ED this evening for evaluation of nausea, vomiting, and hypotension.  Blood pressure 80/50 on arrival.  In A-fib with heart rate in the 110s to 130s.  Afebrile.  Labs notable for WBC 6.6, hemoglobin 7.3 (was 7.0 on discharge yesterday), platelet count 46k (was 51k yesterday), sodium 136, potassium 3.6, chloride 103, bicarb 21, BUN 32, creatinine 1.8 (was 1.9 yesterday), glucose 205, calcium 8.4, albumin 2.6, AST 105, ALT 59, alk phos 127, T. bili 5.2 (LFTs overall stable since discharge), INR 1.3, lipase 33, lactate 1.6, troponin 41> 50.  EKG showing A-fib and no acute ischemic changes. CT abdomen pelvis showing decrease in size of hemoperitoneum, stable L>R pleural effusions, and no acute findings. EDP discussed the case with Dr. Fredia Sorrow, IR team will see the patient in consultation.  Requested admission by medicine service.  Patient was given Zofran, Oxy IR 5 mg, IV Protonix 40 mg, and 500 mL normal saline.  She was also given ceftriaxone  for possible abdominal wall cellulitis/panniculitis.  Blood pressure improved after fluid bolus.  However, patient complained of shortness of breath and desatted to 85% on room air, placed on 2 L [place].  Chest x-ray pending.   Patient is upset that she has continued to vomit since after her recent TIPS procedure but was still discharged from the hospital yesterday.  She was not able to tolerate any p.o. intake at home.  She is having pain across her upper abdomen since after the procedure but thinks the pain is stable and not any worse.  Denies fevers.  She is endorsing shortness of breath and requesting her home CPAP for sleep apnea.  Denies chest pain.  Assessment & Plan:   Principal Problem:   Nausea and vomiting Active Problems:   Anemia   Cellulitis, abdominal wall   Chronic heart failure with preserved ejection fraction (HFpEF) (HCC)   Thrombocytopenia (HCC)   Type 2 diabetes mellitus (HCC)   Atrial fibrillation with RVR (HCC)   Liver cirrhosis secondary to NASH (HCC)   Chronic hypotension   Hypoxia   Nausea & vomiting     Acute blood loss anemia Hemoperitoneum -Hemoglobin around 12, status post recent attempted TIPS procedures, unsuccessful, with evidence of bleed/hemoperitoneum, hemoglobin down to 6.3 this admission. -She he received 2 units PRBC during hospital stay, hemoglobin 7.7, remains tachycardic, hypotensive, will give another unit PRBC  Hypotension -Multifactorial, in the setting of A-fib with RVR, anemia, liver cirrhosis -Continue with midodrine. -Will give albumin -Transfuse BC as needed, likely will need to target hemoglobin of 9 as discussed with cardiology   Acute on chronic diastolic CHF -She is with evidence of volume  overload, but diuresis is limiting in the setting of low blood pressure, continue to hold diuresis, hold metolazone, hold Aldactone for now  Decompensated NASH liver cirrhosis Coagulopathy Thrombocytopenia -The setting of acute blood loss  anemia, with thrombocytopenia we will transfuse 1 unit platelet. -Will give vitamin K given coagulopathy -Continue with rifaximin -She failed TIPS procedure. -The pheresis remains on hold due to low blood pressure -Continue with lactulose  Permanent A-fib with RVR -This is in the setting of hypotension, acute blood loss anemia -Difficult to control, cardiology input greatly appreciated, will need to address underlying causes including her anemia, hypertension and volume status -Remains on amiodarone drip, continue as needed for now, continue with digoxin, blood pressure limits AV block oral agents titration -Not a candidate for anticoagulation due to thrombocytopenia, and acute blood loss anemia    Abdominal wall cellulitis/panniculitis -Initially on Rocephin, continue with Keflex.   Hypoxia -Remains on 2 to 3 L nasal cannula. -Bilateral pleural effusion, vascular congestion, hopefully should improve once more stable for diuresis -Encouraged use incentive spirometry and flutter valve -VQ scan negative for PE   Elevated troponin Demand ischemia due to anemia, hypertension, non-ACS pattern, EKG nonacute.  AKI on CKD stage IIIb -Her baseline creatinine around 1.4-1.5, it is 2.03 this admission, she is high risk for hepatorenal syndrome, as well IV fluid is challenging in the setting of her volume status, so we will try to manage with IV albumin, and PRBC transfusion, continue with midodrine    Type 2 diabetes Glucose 205.  Last A1c 4.7 in February 2024, repeat ordered.  Sensitive sliding scale insulin every 4 hours for now until patient's p.o. intake improves.    The patient is critically ill with multi-organ failure.  Critical care was necessary to treat or prevent imminent or life-threatening deterioration of decompensated renal failure, cardiac failure, renal failure, hypotension, high risk for decompensation, transferred to ICU, and was exclusive of separately billable procedures  and treating other patients. Total critical care time spent by me: 40 minutes Time spent personally by me on obtaining history from patient or surrogate, evaluation of the patient, evaluation of patient's response to treatment, ordering and review of laboratory studies, ordering and review of radiographic studies, ordering and performing treatments and interventions, and re-evaluation of the patient's condition.  DVT prophylaxis: SCD Code Status: Full Family Communication: none at bedside Disposition:   Status is: Inpatient    Consultants:  cardiolgoy   Subjective:  Reports generalized weakness, fatigue, some dyspnea  Objective: Vitals:   07/05/23 0850 07/05/23 1115 07/05/23 1145 07/05/23 1240  BP:   104/87 (!) 122/55  Pulse: (!) 119 99 (!) 103 (!) 103  Resp: 20  17 19   Temp:   98.4 F (36.9 C)   TempSrc:   Oral   SpO2: 96%   91%  Weight:      Height:        Intake/Output Summary (Last 24 hours) at 07/05/2023 1326 Last data filed at 07/05/2023 0900 Gross per 24 hour  Intake 842.91 ml  Output 1600 ml  Net -757.09 ml   Filed Weights   07/04/23 1827  Weight: (!) 151.2 kg    Examination:  Awake Alert, Oriented X 3,, frail, ill-appearing no new F.N deficits, Normal affect Symmetrical Chest wall movement, abdomen or entry at the bases Irregular irregular, tachycardic,No Gallops,Rubs or new Murmurs, No Parasternal Heave +ve B.Sounds, Abd Soft, No tenderness, No rebound - guarding or rigidity.  Some ecchymosis at left flank area at the site  of recent procedure. No Cyanosis, Clubbing, +3 edema     Data Reviewed: I have personally reviewed following labs and imaging studies  CBC: Recent Labs  Lab 07/01/23 1653 07/02/23 0945 07/03/23 1307 07/04/23 0157 07/04/23 1100 07/05/23 0408  WBC 10.3 6.6 6.6 4.3  --  4.2  HGB 7.8* 7.0* 7.3* 6.3* 7.5* 7.7*  HCT 23.3* 20.8* 21.9* 19.8* 22.5* 22.9*  MCV 100.4* 98.6 100.9* 103.1*  --  101.3*  PLT 67* 51* 46* 24*  --  22*     Basic Metabolic Panel: Recent Labs  Lab 07/01/23 1653 07/02/23 0945 07/03/23 1307 07/04/23 0157 07/05/23 0408  NA 138 134* 136 135 135  K 3.5 3.5 3.6 3.8 3.6  CL 103 102 103 103 101  CO2 24 21* 21* 22 22  GLUCOSE 186* 201* 205* 182* 167*  BUN 29* 31* 32* 34* 28*  CREATININE 2.25* 1.97* 1.87* 2.03* 1.87*  CALCIUM 8.9 8.6* 8.4* 8.5* 8.7*    GFR: Estimated Creatinine Clearance: 44.9 mL/min (A) (by C-G formula based on SCr of 1.87 mg/dL (H)).  Liver Function Tests: Recent Labs  Lab 07/01/23 1653 07/02/23 0945 07/03/23 1500 07/04/23 0157 07/05/23 0408  AST 183* 140* 105* 83* 56*  ALT 62* 65* 59* 52* 43  ALKPHOS 105 109 127* 123 112  BILITOT 7.7* 5.1* 5.2* 5.2* 4.8*  PROT 5.8* 5.6* 5.8* 5.5* 5.6*  ALBUMIN 2.7* 2.5* 2.6* 2.3* 2.6*    CBG: Recent Labs  Lab 07/04/23 2010 07/04/23 2354 07/05/23 0517 07/05/23 0824 07/05/23 1145  GLUCAP 197* 173* 160* 183* 145*     Recent Results (from the past 240 hour(s))  Surgical pcr screen     Status: None   Collection Time: 06/30/23  6:18 AM   Specimen: Nasal Mucosa; Nasal Swab  Result Value Ref Range Status   MRSA, PCR NEGATIVE NEGATIVE Final   Staphylococcus aureus NEGATIVE NEGATIVE Final    Comment: (NOTE) The Xpert SA Assay (FDA approved for NASAL specimens in patients 31 years of age and older), is one component of a comprehensive surveillance program. It is not intended to diagnose infection nor to guide or monitor treatment. Performed at [facility], [address] [place], [place] [address]   Culture, blood (Routine X 2) w Reflex to ID Panel     Status: None (Preliminary result)   Collection Time: 07/04/23  2:02 AM   Specimen: BLOOD RIGHT HAND  Result Value Ref Range Status   Specimen Description BLOOD RIGHT HAND  Final   Special Requests   Final    BOTTLES DRAWN AEROBIC AND ANAEROBIC Blood Culture adequate volume   Culture   Final    NO GROWTH 1 DAY Performed at [facility], [address], [place], [place] [address]    Report Status PENDING  Incomplete  Culture, blood (Routine X 2) w Reflex to ID Panel     Status: None (Preliminary result)   Collection Time: 07/04/23  2:02 AM   Specimen: BLOOD  Result Value Ref Range Status   Specimen Description BLOOD LEFT ANTECUBITAL  Final   Special Requests   Final    BOTTLES DRAWN AEROBIC AND ANAEROBIC Blood Culture adequate volume   Culture   Final    NO GROWTH 1 DAY Performed at [facility], [address] [place], [place] [address]    Report Status PENDING  Incomplete  MRSA Next Gen by PCR, Nasal     Status: None   Collection Time:  07/04/23  6:10 AM   Specimen: Nasal Mucosa; Nasal Swab  Result Value Ref Range Status   MRSA by PCR Next Gen NOT DETECTED NOT DETECTED Final    Comment: (NOTE) The GeneXpert MRSA Assay (FDA approved for NASAL specimens only), is one component of a comprehensive MRSA colonization surveillance program. It is not intended to diagnose MRSA infection nor to guide or monitor treatment for MRSA infections. Test performance is not FDA approved in patients less than 13 years old. Performed at [facility], [address] [place], [place] [address]          Radiology Studies: NM Pulmonary Perfusion  Result Date: 07/04/2023 CLINICAL DATA:  CHF, shortness of breath and chest pain. EXAM: NUCLEAR MEDICINE PERFUSION LUNG SCAN TECHNIQUE: Perfusion images were obtained in multiple projections after intravenous injection of radiopharmaceutical. Ventilation scans intentionally deferred if perfusion scan and chest x-ray adequate for interpretation during COVID 19 epidemic. RADIOPHARMACEUTICALS:  4.4 mCi Tc-25m MAA IV COMPARISON:  Chest x-ray 07/03/2019 FINDINGS: Lateral projections are limited due to patient having difficulty tolerating the procedure. Heart is enlarged with elevation of the left hemidiaphragm. On x-ray there is also some left lung base opacity. Perfusion defect at the left lung  base is corresponding to the area of the lung opacity. A few small subsegmental defects elsewhere. IMPRESSION: Conglomeration of findings consistent with a low probability perfusion lung scan. Electronically Signed   By: Karen Kays M.D.   On: 07/04/2023 16:07   DG Chest Port 1 View  Result Date: 07/04/2023 CLINICAL DATA:  Possible hypoxia EXAM: PORTABLE CHEST 1 VIEW COMPARISON:  07/01/2023 FINDINGS: Cardiomegaly with vascular congestion and diffuse increased interstitial opacity suspicious for edema. Suspected airspace disease at the left base. Aortic atherosclerosis. IMPRESSION: Cardiomegaly with vascular congestion and interstitial edema increased compared to prior radiograph. Difficult to exclude left effusion or left basilar airspace disease Electronically Signed   By: Jasmine Pang M.D.   On: 07/04/2023 00:08   CT ABDOMEN PELVIS WO CONTRAST  Result Date: 07/03/2023 CLINICAL DATA:  Abdominal pain. Generalized weakness and hypotension. Hemoperitoneum EXAM: CT ABDOMEN AND PELVIS WITHOUT CONTRAST TECHNIQUE: Multidetector CT imaging of the abdomen and pelvis was performed following the standard protocol without IV contrast. RADIATION DOSE REDUCTION: This exam was performed according to the departmental dose-optimization program which includes automated exposure control, adjustment of the mA and/or kV according to patient size and/or use of iterative reconstruction technique. COMPARISON:  CT abdomen and pelvis without contrast 07/01/23 FINDINGS: Lower chest: Left greater than right pleural effusions are stable. Mild atelectasis is present at the left base. The heart is enlarged. Blood chambers are hypodense, consistent with anemia. Hepatobiliary: Nodular irregularity is present within the liver. No discrete lesions are present. Hemoperitoneum along the anterior margin of the liver is decreased in size compared to the prior exam. Cholecystectomy noted. No significant intra or extrahepatic biliary dilation  is present. Pancreas: Unremarkable. No pancreatic ductal dilatation or surrounding inflammatory changes. Spleen: Splenomegaly again noted. Adrenals/Urinary Tract: The adrenal glands are normal bilaterally. Bilateral simple cysts are stable. The ureters are within normal limits. The urinary bladder is mostly collapsed. Stomach/Bowel: The stomach and duodenum are within normal limits. Small bowel is unremarkable. Terminal ileum is normal. Appendectomy noted. Ascending and transverse colon within normal limits. Descending and sigmoid colon are normal. Vascular/Lymphatic: Scattered atherosclerotic calcifications are present within the aorta and branch vessels. No aneurysm is present. Cavernous transformation of the portal vein is again noted. Aneurysm coils are present. No significant  adenopathy is present. Reproductive: IUD remains. Uterus and adnexa are otherwise within normal limits. Other: Hemoperitoneum extending into the pelvis is decreased compared to the prior exam. No new hemorrhage is present. Diffuse subcutaneous edema is present. No free air is present. Musculoskeletal: Mild degenerative changes are again noted in the thoracolumbar spine. No focal osseous lesions are present. Bony pelvis is normal. The hips are located and within normal limits bilaterally. IMPRESSION: 1. Decreased hemoperitoneum compared to the prior exam. 2. Stable left greater than right pleural effusions. 3. Stable cardiomegaly. 4. Anemia. 5. Stable splenomegaly. 6. Stable simple cysts of both kidneys. 7. Cavernous transformation of the portal vein. 8.  Aortic Atherosclerosis (ICD10-I70.0). Electronically Signed   By: Marin Roberts M.D.   On: 07/03/2023 21:59        Scheduled Meds:  sodium chloride   Intravenous Once   cephALEXin  500 mg Oral Q12H   digoxin  0.125 mg Oral Daily   furosemide  80 mg Intravenous BID   insulin aspart  0-9 Units Subcutaneous Q4H   lactulose  20 g Oral TID   metoprolol tartrate  12.5 mg Oral  BID   midodrine  20 mg Oral TID WC   phytonadione  5 mg Oral Daily   rifaximin  550 mg Oral BID   Continuous Infusions:  amiodarone 30 mg/hr (07/05/23 1305)     LOS: 1 day       Huey Bienenstock, MD [facility]   To contact the attending provider between 7A-7P or the covering provider during after hours 7P-7A, please log into the web site [email or web address] and access using universal [place] password for that web site. If you do not have the password, please call the hospital operator.  07/05/2023, 1:26 PM

## 2023-07-05 NOTE — Progress Notes (Addendum)
Patient Name: Hannah Khan Date of Encounter: 07/05/2023 [facility] Cardiologist: Chilton Si, MD   Interval Summary  .    67 y.o. female with a hx of HFpEF, morbid obesity, OSA, NASH cirrhosis, esophageal varices, history of hepatic encephalopathy, s/p unsuccessful TIPS 06/30/2023, thrombocytopenia, type 2 diabetes, aortic stenosis, vasovagal syncope, and permanent atrial fibrillation, recurrent panniculitis, cardiology is following for acute CHF and A fib with RVR. S/P recent unsuccessful TIPS creation on 06/30/23, complicated by hemoperitoneum on CT post procedure. She came in initially with nausea, vomiting, hypotension, A fib RVR, in the setting of acute blood loss anemia and AKI.   Patient is crying this morning, feels she is doing poor overall. She states she has never be so anemic before. RN states she only has one IV access, amiodarone was stopped overnight to allow blood transfusion. She feels her SOB and leg edema are not much changed. She offers no chest pain, dizziness.   Vital Signs .    Vitals:   07/05/23 0400 07/05/23 0655 07/05/23 0712 07/05/23 0800  BP: (!) 101/52 91/68 (!) 94/52 (!) 96/49  Pulse: 94 (!) 105 (!) 105 (!) 115  Resp: 16 (!) 21 16 20   Temp: 98 F (36.7 C) 97.9 F (36.6 C) (!) 97.3 F (36.3 C) 98.4 F (36.9 C)  TempSrc: Oral Oral Oral Oral  SpO2: 94% 90% 94% 95%  Weight:      Height:        Intake/Output Summary (Last 24 hours) at 07/05/2023 0848 Last data filed at 07/05/2023 0500 Gross per 24 hour  Intake 942.91 ml  Output 1900 ml  Net -957.09 ml      07/04/2023    6:27 PM 06/30/2023    8:08 PM 06/30/2023    5:59 AM  Last 3 Weights  Weight (lbs) 333 lb 5.4 oz 331 lb 2.1 oz 322 lb 6.4 oz  Weight (kg) 151.2 kg 150.2 kg 146.24 kg      Telemetry/ECG    A fib with RVR 110-120 - Personally Reviewed  Physical Exam .   GEN: No acute distress.  Morbidly obese  Neck: No JVD, short and thick neck  Cardiac: Irregularly  irregular, no murmurs, rubs, or gallops.  Respiratory: Clear to auscultation bilaterally. On room air today with pox 96%  GI: Soft, nontender, non-distended, abdomen obese  MS: BLE 1+ edema, chronic venous stasis change noted   Assessment & Plan .     Permanent A fib RVR -In the setting of acute on chronic blood loss anemia with hemoglobin 6.3 on 07/04/23, agree with PRBC transfusion -Blood culture x2 from 07/04/23 NTD, no fever/leukocytosis, no evidence of sepsis thus far  -OK to continue IV amiodarone for short course, avoid prolong use due to risk of toxicity and absence of anticoagulation, goal to treat underlying cause of tachyarrhythmia -Continue digoxin and metoprolol at current dose, would not aggressively lower heart rate until underlying cause corrected -Poor candidate for anticoagulation due auto coagulopathy 2/2 cirrhosis - Keep Mag >2 and K >4, will add Mag level today   Acute heart failure with preserved EF - increased resting SOB, orthopnea, leg edema since 06/30/23 TIPS procedure  - BNP 531, CXR with vascular congestion and interstitial edema on 07/04/23  - on PTA torsemide 80mg  AM and 60mg  PM, PRN metolazone when weight gain >3-5 pounds in a week - started IV Lasix 80mg  BID, UOP overnight, I&O is not accurate, weight is not tracked, please track strict I&O, daily  weight, and BMP; weaned off oxygen today, she remains hypervolemic on exam, renal index and LFTs improving today suggestive CRS and hepatic congestion, will continue IV Lasix 80mg  BID for 24- 48 hours  - GDMT: Low BP has been limiting historically for GDMT, remains on midodrine (increased to 20mg  TID),  continue digoxin 0.125mg  daily/may need to stop if AKI worsens, continue metoprolol 12.5mg  BID, will not add additional therapy, not a candidate for ARNI/MRA/SGLT2I now due to AKI - if inadequate response, may involve advanced heart failure team this week  - consider iron panel evaluation, IV iron therapy cab be  beneficial for CHF if this is indicated    Pulmonary hypertension - Echo from 03/07/23 with elevated PASP 39.6 mmHg - RHC 05/19/23 with RA 10 mmHg, RV 34/10 mmHg, PA 37/18 mmHg, PCWP 23 mmHg, normal CO /CI - suspect multifactorial from OSA/OHS/portal hypertension/group 2 - diuresis to euvolemia for now    AKI on CKD IIIb - Cr 1.39 and GFR 42 on 06/30/23 before TIPS, rising to 2.25 on 07/01/23, after diuresis now Cr is downtrending to 1.87 today with also improved LFTs, unlikely hepatorenal syndrome - avoid nephrotoxin    NASH cirrhosis Acute on chronic anemia Chronic thrombocytopenia Hyperbilirubinemia  Hypotension History of esophageal varices History of hepatic encephalopathy - per primary team    For questions or updates, please contact [facility] Please consult [email or web address] for contact info under      Signed, Cyndi Bender, NP   History and all data above reviewed.  Patient examined.  I agree with the findings as above.   The patient is breathing a little bit better.  Creat is slightly improved.  Good UO yesterday.  The patient exam reveals [id number]   ,  Lungs: Clear  ,  Abd: Positive bowel sounds, no rebound no guarding, Ext Moderate edema  .  All available labs, radiology testing, previous records reviewed. Agree with documented assessment and plan.  Acute diastolic HF:  Continue IV diuresis but she needs to keep her feet elevated.  We discussed this.  Excess volume is related to liver failure, chronic diastolic dysfunction and the fluid resuscitation she required.  Now need to mobilize that fluid and decongest.  This will be facilitated by correcting her anemia.  I will suggest another unit of blood and continued IV Lasix.   Fayrene Fearing Nerine Pulse  10:45 AM  07/05/2023

## 2023-07-06 ENCOUNTER — Inpatient Hospital Stay (HOSPITAL_COMMUNITY): Payer: Medicare PPO

## 2023-07-06 ENCOUNTER — Other Ambulatory Visit: Payer: Self-pay

## 2023-07-06 DIAGNOSIS — D62 Acute posthemorrhagic anemia: Secondary | ICD-10-CM

## 2023-07-06 DIAGNOSIS — I4891 Unspecified atrial fibrillation: Secondary | ICD-10-CM | POA: Diagnosis not present

## 2023-07-06 LAB — BPAM PLATELET PHERESIS
Blood Product Expiration Date: 202410182359
ISSUE DATE / TIME: 202410160635
Unit Type and Rh: 600

## 2023-07-06 LAB — PREPARE PLATELET PHERESIS: Unit division: 0

## 2023-07-06 LAB — BLOOD CULTURE ID PANEL (REFLEXED) - BCID2

## 2023-07-06 LAB — BPAM RBC
Blood Product Expiration Date: 202411072359
Blood Product Expiration Date: 202411072359
ISSUE DATE / TIME: 202410150531
ISSUE DATE / TIME: 202410161414
Unit Type and Rh: 202411072359
Unit Type and Rh: 5100
Unit Type and Rh: 5100

## 2023-07-06 LAB — TYPE AND SCREEN
ABO/RH(D): O POS
Antibody Screen: NEGATIVE
Unit division: 0
Unit division: 0

## 2023-07-06 LAB — COMPREHENSIVE METABOLIC PANEL
ALT: 36 U/L (ref 0–44)
AST: 43 U/L — ABNORMAL HIGH (ref 15–41)
Albumin: 3.1 g/dL — ABNORMAL LOW (ref 3.5–5.0)
Alkaline Phosphatase: 104 U/L (ref 38–126)
Anion gap: 12 (ref 5–15)
BUN: 26 mg/dL — ABNORMAL HIGH (ref 8–23)
CO2: 21 mmol/L — ABNORMAL LOW (ref 22–32)
Calcium: 8.7 mg/dL — ABNORMAL LOW (ref 8.9–10.3)
Chloride: 102 mmol/L (ref 98–111)
Creatinine, Ser: 1.73 mg/dL — ABNORMAL HIGH (ref 0.44–1.00)
GFR, Estimated: 32 mL/min — ABNORMAL LOW (ref >60)
Glucose, Bld: 179 mg/dL — ABNORMAL HIGH (ref 70–99)
Potassium: 3.4 mmol/L — ABNORMAL LOW (ref 3.5–5.1)
Sodium: 135 mmol/L (ref 135–145)
Total Bilirubin: 5.2 mg/dL — ABNORMAL HIGH (ref 0.3–1.2)
Total Protein: 6 g/dL — ABNORMAL LOW (ref 6.5–8.1)

## 2023-07-06 LAB — CBC
HCT: 25.5 % — ABNORMAL LOW (ref 36.0–46.0)
Hemoglobin: 8.4 g/dL — ABNORMAL LOW (ref 12.0–15.0)
MCH: 33.6 pg (ref 26.0–34.0)
MCHC: 32.9 g/dL (ref 30.0–36.0)
MCV: 102 fL — ABNORMAL HIGH (ref 80.0–100.0)
Platelets: 25 10*3/uL — CL (ref 150–400)
RBC: 2.5 MIL/uL — ABNORMAL LOW (ref 3.87–5.11)
RDW: 18.7 % — ABNORMAL HIGH (ref 11.5–15.5)
WBC: 3.4 10*3/uL — ABNORMAL LOW (ref 4.0–10.5)
nRBC: 0 % (ref 0.0–0.2)

## 2023-07-06 LAB — GLUCOSE, CAPILLARY
Glucose-Capillary: 119 mg/dL — ABNORMAL HIGH (ref 70–99)
Glucose-Capillary: 132 mg/dL — ABNORMAL HIGH (ref 70–99)
Glucose-Capillary: 152 mg/dL — ABNORMAL HIGH (ref 70–99)
Glucose-Capillary: 160 mg/dL — ABNORMAL HIGH (ref 70–99)
Glucose-Capillary: 194 mg/dL — ABNORMAL HIGH (ref 70–99)

## 2023-07-06 LAB — PROTIME-INR
INR: 1.4 — ABNORMAL HIGH (ref 0.8–1.2)
Prothrombin Time: 17 s — ABNORMAL HIGH (ref 11.4–15.2)

## 2023-07-06 MED ORDER — SODIUM CHLORIDE 0.9% IV SOLUTION: Freq: Once | INTRAVENOUS | Status: AC

## 2023-07-06 MED ORDER — CHLORHEXIDINE GLUCONATE CLOTH 2 % EX PADS
6.0000 | MEDICATED_PAD | Freq: Every day | CUTANEOUS | Status: DC
  Administered 2023-07-06 – 2023-07-11 (×6): 6 via TOPICAL

## 2023-07-06 MED ORDER — SODIUM CHLORIDE 0.9% FLUSH
10.0000 mL | Freq: Two times a day (BID) | INTRAVENOUS | Status: DC
  Administered 2023-07-06 – 2023-07-11 (×10): 10 mL

## 2023-07-06 MED ORDER — METOPROLOL TARTRATE 12.5 MG HALF TABLET
12.5000 mg | ORAL_TABLET | Freq: Four times a day (QID) | ORAL | Status: DC
  Administered 2023-07-06 – 2023-07-08 (×7): 12.5 mg via ORAL
  Filled 2023-07-06 (×8): qty 1

## 2023-07-06 MED ORDER — SODIUM CHLORIDE 0.9% FLUSH: 10.0000 mL | INTRAVENOUS | Status: DC | PRN

## 2023-07-06 NOTE — Evaluation (Signed)
Physical Therapy Evaluation Patient Details Name: Hannah Khan MRN: [id number] DOB: Jan 05, 1956 Today's Date: 07/06/2023  History of Present Illness  Patient is a 67 year old female with recent hospital stay with attempted TIPS procedure, nausea and vomiting, hypotension, acute blood loss anemia, hemoperitoneum.  Permanent A-fib with RVR, decompensated NASH liver cirrhosis, thrombocytopenia, abdominal wall cellulitis/panniculitis, hypoxia.  Clinical Impression  Patient is agreeable to PT evaluation. Patient lives with spouse and is modified independent with ambulation using rollator at home.  Today, the patient is modified independent with bed mobility. Sitting balance is good with no dizziness reported with upright activity. No shortness of breath is noted and no significant change with heart rate while seated. Patient declined standing or walking due to fatigue and recent medications. She does not feel she is at her baseline, however anticipate patient will be able to return home with spouse at discharge. Patient declined home health PT but is interested in resuming outpatient PT at discharge. PT will continue to follow to maximize independence and facilitate return to prior level of function.       If plan is discharge home, recommend the following: A little help with bathing/dressing/bathroom;A little help with walking and/or transfers;Help with stairs or ramp for entrance;Assist for transportation;Assistance with cooking/housework   Can travel by private vehicle        Equipment Recommendations None recommended by PT  Recommendations for Other Services       Functional Status Assessment Patient has had a recent decline in their functional status and demonstrates the ability to make significant improvements in function in a reasonable and predictable amount of time.     Precautions / Restrictions Precautions Precautions: Fall Restrictions Weight Bearing Restrictions: No       Mobility  Bed Mobility Overal bed mobility: Modified Independent             General bed mobility comments: increased time, no physical assistance required    Transfers                   General transfer comment: patient declined standing due to fatigue    Ambulation/Gait                  Stairs            Wheelchair Mobility     Tilt Bed    Modified Rankin (Stroke Patients Only)       Balance Overall balance assessment: Needs assistance Sitting-balance support: Feet supported Sitting balance-Leahy Scale: Good Sitting balance - Comments: no loss of balance and patient requesting to remain sitting on edge of bed for lunch                                     Pertinent Vitals/Pain Pain Assessment Pain Assessment: No/denies pain    Home Living Family/patient expects to be discharged to:: Private residence Living Arrangements: Spouse/significant other Available Help at Discharge: Family;Available 24 hours/day Type of Home: House Home Access: Stairs to enter Entrance Stairs-Rails: None Entrance Stairs-Number of Steps: 1-2   Home Layout: One level Home Equipment: Rollator (4 wheels);Tub bench;Lift chair Additional Comments: CPAP at night. has an adjustable bed    Prior Function Prior Level of Function : Independent/Modified Independent             Mobility Comments: rollator, limited distance ambulation ADLs Comments: spouse does cooking, cleaning, driving.  Extremity/Trunk Assessment   Upper Extremity Assessment Upper Extremity Assessment: Overall WFL for tasks assessed    Lower Extremity Assessment Lower Extremity Assessment: Generalized weakness       Communication   Communication Communication: No apparent difficulties  Cognition Arousal: Alert Behavior During Therapy: WFL for tasks assessed/performed Overall Cognitive Status: Within Functional Limits for tasks assessed                                           General Comments General comments (skin integrity, edema, etc.): edema noted throughout BLE with possible weeping. patient has been doing SLR and ankle pumps in the bed per her report. no shortness of breath with seated level activity and no significant increase in heart rate noted    Exercises     Assessment/Plan    PT Assessment Patient needs continued PT services  PT Problem List Decreased strength;Decreased activity tolerance;Decreased mobility       PT Treatment Interventions DME instruction;Gait training;Functional mobility training;Stair training;Therapeutic exercise;Therapeutic activities;Balance training;Patient/family education    PT Goals (Current goals can be found in the Care Plan section)  Acute Rehab PT Goals Patient Stated Goal: to go home PT Goal Formulation: With patient Time For Goal Achievement: 07/13/23 Potential to Achieve Goals: Fair    Frequency Min 1X/week     Co-evaluation               AM-PAC PT "6 Clicks" Mobility  Outcome Measure Help needed turning from your back to your side while in a flat bed without using bedrails?: None Help needed moving from lying on your back to sitting on the side of a flat bed without using bedrails?: A Little Help needed moving to and from a bed to a chair (including a wheelchair)?: A Little Help needed standing up from a chair using your arms (e.g., wheelchair or bedside chair)?: A Little Help needed to walk in hospital room?: A Little Help needed climbing 3-5 steps with a railing? : A Little 6 Click Score: 19    End of Session Equipment Utilized During Treatment: Oxygen Activity Tolerance: Patient tolerated treatment well;Patient limited by fatigue Patient left: with call bell/phone within reach;with bed alarm set;with family/visitor present (seated on side of bed per patient preference to eat lunch) Nurse Communication: Mobility status PT Visit Diagnosis: Other  abnormalities of gait and mobility (R26.89);Muscle weakness (generalized) (M62.81)    Time: 1130-1203 PT Time Calculation (min) (ACUTE ONLY): 33 min   Charges:   PT Evaluation $PT Eval Low Complexity: 1 Low   PT General Charges $$ ACUTE PT VISIT: 1 Visit         Donna Bernard, PT, MPT   Ina Homes 07/06/2023, 2:02 PM

## 2023-07-06 NOTE — Progress Notes (Signed)
At bedside for [facility] insertion. Very poor vasculature. Vessels splitting and over 2 cm in depth on RLA. Attempted PIV access x 2 with success. Only 1 viable vessel identified for cannulation. 22G/1'. Made RN aware this pt will require central access for any further PIV needs.

## 2023-07-06 NOTE — Progress Notes (Signed)
Peripherally Inserted Central Catheter Placement  The IV Nurse has discussed with the patient and/or persons authorized to consent for the patient, the purpose of this procedure and the potential benefits and risks involved with this procedure.  The benefits include less needle sticks, lab draws from the catheter, and the patient may be discharged home with the catheter. Risks include, but not limited to, infection, bleeding, blood clot (thrombus formation), and puncture of an artery; nerve damage and irregular heartbeat and possibility to perform a PICC exchange if needed/ordered by physician.  Alternatives to this procedure were also discussed.  Bard Power PICC patient education guide, fact sheet on infection prevention and patient information card has been provided to patient /or left at bedside.    PICC Placement Documentation  PICC Single Lumen 07/06/23 Right Cephalic 44 cm 0 cm (Active)  Indication for Insertion or Continuance of Line Limited venous access - need for IV therapy >5 days (PICC only) 07/06/23 1852  Exposed Catheter (cm) 0 cm 07/06/23 1852  Site Assessment Clean, Dry, Intact 07/06/23 1852  Line Status Flushed;Saline locked;Blood return noted 07/06/23 1852  Dressing Type Transparent;Securing device 07/06/23 1852  Dressing Status Antimicrobial disc in place;Clean, Dry, Intact 07/06/23 1852  Line Care Connections checked and tightened 07/06/23 1852  Line Adjustment (NICU/IV Team Only) No 07/06/23 1852  Dressing Intervention New dressing;Adhesive placed at insertion site (IV team only) 07/06/23 1852  Dressing Change Due 07/13/23 07/06/23 1852       Elenore Paddy 07/06/2023, 6:53 PM

## 2023-07-06 NOTE — Progress Notes (Signed)
Mobility Specialist Progress Note;   07/06/23 1615  Mobility  Activity Ambulated with assistance in hallway  Level of Assistance Contact guard assist, steadying assist  Assistive Device Front wheel walker  Distance Ambulated (ft) 50 ft  Activity Response Tolerated well  Mobility Referral Yes  $Mobility charge 1 Mobility  Mobility Specialist Start Time (ACUTE ONLY) 1615  Mobility Specialist Stop Time (ACUTE ONLY) 1640  Mobility Specialist Time Calculation (min) (ACUTE ONLY) 25 min   Pre-mobility: SPO2 93% 3.5LO2 During-mobility: SPO2 92% 6LO2 Post-mobility: SPO2 90% 3.5LO2  Pt agreeable to mobility with encouragement. Required minG assistance during ambulation. Attempted to ambulate on 4LO2, however desat to 85%. Required 6LO2 to maintain SPO2 WFL. Took x1 seated rest break d/t SOB and fatigue. C/o BUE fatigue during ambulation. Pt to [facility] before returning to EOB. Pt left on 3.5LO2 on EOB with all needs met.  Caesar Bookman Mobility Specialist Please contact via SecureChat or Rehab Office [phone]

## 2023-07-06 NOTE — Progress Notes (Signed)
Progress Note  Patient Name: Hannah Khan Date of Encounter: 07/06/2023  Primary Cardiologist:   Chilton Si, MD   Subjective   No chest pain.  Breathing she thinks is not at baseline but she is not in distress  Inpatient Medications    Scheduled Meds:  cephALEXin  500 mg Oral Q12H   digoxin  0.125 mg Oral Daily   furosemide  40 mg Intravenous BID   insulin aspart  0-9 Units Subcutaneous Q4H   lactulose  20 g Oral TID   lidocaine  2 patch Transdermal Q24H   metoprolol tartrate  12.5 mg Oral BID   midodrine  20 mg Oral TID WC   phytonadione  5 mg Oral Daily   rifaximin  550 mg Oral BID   spironolactone  25 mg Oral Daily   Continuous Infusions:  albumin human 25 g (07/06/23 0152)   amiodarone 30 mg/hr (07/05/23 2337)   PRN Meds: naLOXone (NARCAN)  injection, ondansetron **OR** ondansetron (ZOFRAN) IV, oxyCODONE   Vital Signs    Vitals:   07/06/23 0000 07/06/23 0420 07/06/23 0805 07/06/23 0902  BP: (!) 102/46   104/60  Pulse: 84   (!) 109  Resp: 16     Temp:      TempSrc:   Oral   SpO2: 97%     Weight:  (!) 154 kg    Height:        Intake/Output Summary (Last 24 hours) at 07/06/2023 1049 Last data filed at 07/06/2023 0600 Gross per 24 hour  Intake 982.7 ml  Output 300 ml  Net 682.7 ml   Filed Weights   07/04/23 1827 07/06/23 0420  Weight: (!) 151.2 kg (!) 154 kg    Telemetry    Atrial fib with controlled ventricular rate - Personally Reviewed  ECG    NA - Personally Reviewed  Physical Exam   GEN: No acute distress.   Neck: No  JVD Cardiac: Irregular RR, no murmurs, rubs, or gallops.  Respiratory: Clear  to auscultation bilaterally. GI: Soft, nontender, non-distended  MS:    Moderate diffuse edema; No deformity. Neuro:  Nonfocal  Psych: Normal affect   Labs    Chemistry Recent Labs  Lab 07/03/23 1307 07/03/23 1500 07/04/23 0157 07/05/23 0408  NA 136  --  135 135  K 3.6  --  3.8 3.6  CL 103  --  103 101  CO2 21*  --   22 22  GLUCOSE 205*  --  182* 167*  BUN 32*  --  34* 28*  CREATININE 1.87*  --  2.03* 1.87*  CALCIUM 8.4*  --  8.5* 8.7*  PROT  --  5.8* 5.5* 5.6*  ALBUMIN  --  2.6* 2.3* 2.6*  AST  --  105* 83* 56*  ALT  --  59* 52* 43  ALKPHOS  --  127* 123 112  BILITOT  --  5.2* 5.2* 4.8*  GFRNONAA 29*  --  26* 29*  ANIONGAP 12  --  10 12     Hematology Recent Labs  Lab 07/04/23 0157 07/04/23 1100 07/05/23 0408 07/06/23 0919  WBC 4.3  --  4.2 3.4*  RBC 1.92*  --  2.26* 2.50*  HGB 6.3* 7.5* 7.7* 8.4*  HCT 19.8* 22.5* 22.9* 25.5*  MCV 103.1*  --  101.3* 102.0*  MCH 32.8  --  34.1* 33.6  MCHC 31.8  --  33.6 32.9  RDW 16.0*  --  17.2* 18.7*  PLT 24*  --  22* 25*    Cardiac EnzymesNo results for input(s): "TROPONINI" in the last 168 hours. No results for input(s): "TROPIPOC" in the last 168 hours.   BNP Recent Labs  Lab 07/04/23 0157  BNP 531.6*     DDimer No results for input(s): "DDIMER" in the last 168 hours.   Radiology    NM Pulmonary Perfusion  Result Date: 07/04/2023 CLINICAL DATA:  CHF, shortness of breath and chest pain. EXAM: NUCLEAR MEDICINE PERFUSION LUNG SCAN TECHNIQUE: Perfusion images were obtained in multiple projections after intravenous injection of radiopharmaceutical. Ventilation scans intentionally deferred if perfusion scan and chest x-ray adequate for interpretation during COVID 19 epidemic. RADIOPHARMACEUTICALS:  4.4 mCi Tc-71m MAA IV COMPARISON:  Chest x-ray 07/03/2019 FINDINGS: Lateral projections are limited due to patient having difficulty tolerating the procedure. Heart is enlarged with elevation of the left hemidiaphragm. On x-ray there is also some left lung base opacity. Perfusion defect at the left lung base is corresponding to the area of the lung opacity. A few small subsegmental defects elsewhere. IMPRESSION: Conglomeration of findings consistent with a low probability perfusion lung scan. Electronically Signed   By: Karen Kays M.D.   On: 07/04/2023  16:07    Cardiac Studies   Echo:   03/07/23   1. Left ventricular ejection fraction, by estimation, is 55 to 60%. The  left ventricle has normal function. The left ventricle has no regional  wall motion abnormalities. Left ventricular diastolic function could not  be evaluated.   2. Right ventricular systolic function is normal. The right ventricular  size is normal. There is mildly elevated pulmonary artery systolic  pressure. The estimated right ventricular systolic pressure is 39.6 mmHg.   3. Left atrial size was severely dilated.   4. Right atrial size was severely dilated.   5. The mitral valve is normal in structure. No evidence of mitral valve  regurgitation.   6. Tricuspid valve regurgitation is moderate.   7. The aortic valve is tricuspid. There is moderate calcification of the  aortic valve. There is moderate thickening of the aortic valve. Aortic  valve regurgitation is not visualized. Mild to moderate aortic valve  stenosis.   8. The inferior vena cava is dilated in size with <50% respiratory  variability, suggesting right atrial pressure of 15 mmHg.   Patient Profile     67 y.o. female with a hx of HFpEF, morbid obesity, OSA, NASH cirrhosis, esophageal varices, history of hepatic encephalopathy, s/p unsuccessful TIPS 06/30/2023, thrombocytopenia, type 2 diabetes, aortic stenosis, vasovagal syncope, and permanent atrial fibrillation, recurrent panniculitis, cardiology is following for acute CHF and A fib with RVR. S/P recent unsuccessful TIPS creation on 06/30/23, complicated by hemoperitoneum on CT post procedure. She came in initially with nausea, vomiting, hypotension, A fib RVR, in the setting of acute blood loss anemia and AKI.   Assessment & Plan    Permanent A fib RVR:  She has been on amio IV but there is no plan to continue PO long term.  She would not be a candidate for this.  Management will be based on correcting her anemia and resuming (continuing) her beta  blocker and dig.  Her baseline rate is around 100.  Not an anticoagulation candidate.  Transfused another PRBC unit yesterday.  Her Hgb is up to 8.4.    HR is OK at rest.  Goes up with movement as we will expect for a long time as her Hgb hopefully normalizes.  I will stop the amio and increase  her beta blocker as tolerated.   Acute heart failure with preserved EF:  She has tolerated IV Lasix.  Unable to titrate meds with chronic hypotension.     Pulmonary hypertension :  Echo from 03/07/23 with elevated PASP 39.6 mmHg.   Echo as above.  No change in therapy.    AKI on CKD IIIb:  Creat continues to slowly improve.     For questions or updates, please contact [facility] Please consult [email or web address] for contact info under Cardiology/STEMI.   Signed, Rollene Rotunda, MD  07/06/2023, 10:49 AM

## 2023-07-06 NOTE — Plan of Care (Signed)
Problem: Fluid Volume: Goal: Hemodynamic stability will improve Outcome: Progressing   Problem: Clinical Measurements: Goal: Diagnostic test results will improve Outcome: Progressing Goal: Signs and symptoms of infection will decrease Outcome: Progressing   Problem: Respiratory: Goal: Ability to maintain adequate ventilation will improve Outcome: Progressing   Problem: Education: Goal: Understanding of CV disease, CV risk reduction, and recovery process will improve Outcome: Progressing Goal: Individualized Educational Video(s) Outcome: Progressing   Problem: Activity: Goal: Ability to return to baseline activity level will improve Outcome: Progressing   Problem: Cardiovascular: Goal: Ability to achieve and maintain adequate cardiovascular perfusion will improve Outcome: Progressing Goal: Vascular access site(s) Level 0-1 will be maintained Outcome: Progressing   Problem: Health Behavior/Discharge Planning: Goal: Ability to safely manage health-related needs after discharge will improve Outcome: Progressing   Problem: Education: Goal: Ability to describe self-care measures that may prevent or decrease complications (Diabetes Survival Skills Education) will improve Outcome: Progressing Goal: Individualized Educational Video(s) Outcome: Progressing   Problem: Coping: Goal: Ability to adjust to condition or change in health will improve Outcome: Progressing   Problem: Fluid Volume: Goal: Ability to maintain a balanced intake and output will improve Outcome: Progressing   Problem: Health Behavior/Discharge Planning: Goal: Ability to identify and utilize available resources and services will improve Outcome: Progressing Goal: Ability to manage health-related needs will improve Outcome: Progressing   Problem: Metabolic: Goal: Ability to maintain appropriate glucose levels will improve Outcome: Progressing   Problem: Nutritional: Goal: Maintenance of adequate  nutrition will improve Outcome: Progressing Goal: Progress toward achieving an optimal weight will improve Outcome: Progressing   Problem: Skin Integrity: Goal: Risk for impaired skin integrity will decrease Outcome: Progressing   Problem: Tissue Perfusion: Goal: Adequacy of tissue perfusion will improve Outcome: Progressing   Problem: Education: Goal: Knowledge of General Education information will improve Description: Including pain rating scale, medication(s)/side effects and non-pharmacologic comfort measures Outcome: Progressing   Problem: Health Behavior/Discharge Planning: Goal: Ability to manage health-related needs will improve Outcome: Progressing   Problem: Clinical Measurements: Goal: Ability to maintain clinical measurements within normal limits will improve Outcome: Progressing Goal: Will remain free from infection Outcome: Progressing Goal: Diagnostic test results will improve Outcome: Progressing Goal: Respiratory complications will improve Outcome: Progressing Goal: Cardiovascular complication will be avoided Outcome: Progressing   Problem: Activity: Goal: Risk for activity intolerance will decrease Outcome: Progressing   Problem: Nutrition: Goal: Adequate nutrition will be maintained Outcome: Progressing   Problem: Coping: Goal: Level of anxiety will decrease Outcome: Progressing   Problem: Elimination: Goal: Will not experience complications related to bowel motility Outcome: Progressing Goal: Will not experience complications related to urinary retention Outcome: Progressing   Problem: Pain Managment: Goal: General experience of comfort will improve Outcome: Progressing   Problem: Safety: Goal: Ability to remain free from injury will improve Outcome: Progressing   Problem: Skin Integrity: Goal: Risk for impaired skin integrity will decrease Outcome: Progressing

## 2023-07-06 NOTE — Plan of Care (Signed)
  Problem: Fluid Volume: Goal: Hemodynamic stability will improve Outcome: Progressing   Problem: Clinical Measurements: Goal: Diagnostic test results will improve Outcome: Progressing Goal: Signs and symptoms of infection will decrease Outcome: Progressing   Problem: Respiratory: Goal: Ability to maintain adequate ventilation will improve Outcome: Progressing   Problem: Education: Goal: Understanding of CV disease, CV risk reduction, and recovery process will improve Outcome: Progressing Goal: Individualized Educational Video(s) Outcome: Progressing   Problem: Activity: Goal: Ability to return to baseline activity level will improve Outcome: Progressing   Problem: Cardiovascular: Goal: Ability to achieve and maintain adequate cardiovascular perfusion will improve Outcome: Progressing Goal: Vascular access site(s) Level 0-1 will be maintained Outcome: Progressing   Problem: Health Behavior/Discharge Planning: Goal: Ability to safely manage health-related needs after discharge will improve Outcome: Progressing   Problem: Education: Goal: Ability to describe self-care measures that may prevent or decrease complications (Diabetes Survival Skills Education) will improve Outcome: Progressing Goal: Individualized Educational Video(s) Outcome: Progressing   Problem: Coping: Goal: Ability to adjust to condition or change in health will improve Outcome: Progressing   Problem: Fluid Volume: Goal: Ability to maintain a balanced intake and output will improve Outcome: Progressing   Problem: Health Behavior/Discharge Planning: Goal: Ability to identify and utilize available resources and services will improve Outcome: Progressing Goal: Ability to manage health-related needs will improve Outcome: Progressing   Problem: Metabolic: Goal: Ability to maintain appropriate glucose levels will improve Outcome: Progressing   Problem: Nutritional: Goal: Maintenance of adequate  nutrition will improve Outcome: Progressing Goal: Progress toward achieving an optimal weight will improve Outcome: Progressing   Problem: Skin Integrity: Goal: Risk for impaired skin integrity will decrease Outcome: Progressing   Problem: Tissue Perfusion: Goal: Adequacy of tissue perfusion will improve Outcome: Progressing   Problem: Education: Goal: Knowledge of General Education information will improve Description: Including pain rating scale, medication(s)/side effects and non-pharmacologic comfort measures Outcome: Progressing   Problem: Health Behavior/Discharge Planning: Goal: Ability to manage health-related needs will improve Outcome: Progressing   Problem: Clinical Measurements: Goal: Ability to maintain clinical measurements within normal limits will improve Outcome: Progressing Goal: Will remain free from infection Outcome: Progressing Goal: Diagnostic test results will improve Outcome: Progressing Goal: Respiratory complications will improve Outcome: Progressing Goal: Cardiovascular complication will be avoided Outcome: Progressing   Problem: Activity: Goal: Risk for activity intolerance will decrease Outcome: Progressing   Problem: Nutrition: Goal: Adequate nutrition will be maintained Outcome: Progressing   Problem: Coping: Goal: Level of anxiety will decrease Outcome: Progressing   Problem: Elimination: Goal: Will not experience complications related to bowel motility Outcome: Progressing Goal: Will not experience complications related to urinary retention Outcome: Progressing   Problem: Pain Managment: Goal: General experience of comfort will improve Outcome: Progressing   Problem: Safety: Goal: Ability to remain free from injury will improve Outcome: Progressing   Problem: Skin Integrity: Goal: Risk for impaired skin integrity will decrease Outcome: Progressing

## 2023-07-06 NOTE — Progress Notes (Signed)
PHARMACY - PHYSICIAN COMMUNICATION CRITICAL VALUE ALERT - BLOOD CULTURE IDENTIFICATION (BCID)  Hannah Khan is an 67 y.o. female who presented to [facility] on 07/03/2023 with a chief complaint of a multitude of issues  Assessment:  contaminant (include suspected source if known)  Name of physician (or Provider) Contacted: Elgergawy  Current antibiotics: keflex  Changes to prescribed antibiotics recommended:  Patient is on recommended antibiotics - No changes needed  Results for orders placed or performed during the hospital encounter of 07/03/23  Blood Culture ID Panel (Reflexed) (Collected: 07/04/2023  2:02 AM)  Result Value Ref Range   Enterococcus faecalis NOT DETECTED NOT DETECTED   Enterococcus Faecium NOT DETECTED NOT DETECTED   Listeria monocytogenes NOT DETECTED NOT DETECTED   Staphylococcus species DETECTED (A) NOT DETECTED   Staphylococcus aureus (BCID) NOT DETECTED NOT DETECTED   Staphylococcus epidermidis DETECTED (A) NOT DETECTED   Staphylococcus lugdunensis NOT DETECTED NOT DETECTED   Streptococcus species NOT DETECTED NOT DETECTED   Streptococcus agalactiae NOT DETECTED NOT DETECTED   Streptococcus pneumoniae NOT DETECTED NOT DETECTED   Streptococcus pyogenes NOT DETECTED NOT DETECTED   A.calcoaceticus-baumannii NOT DETECTED NOT DETECTED   Bacteroides fragilis NOT DETECTED NOT DETECTED   Enterobacterales NOT DETECTED NOT DETECTED   Enterobacter cloacae complex NOT DETECTED NOT DETECTED   Escherichia coli NOT DETECTED NOT DETECTED   Klebsiella aerogenes NOT DETECTED NOT DETECTED   Klebsiella oxytoca NOT DETECTED NOT DETECTED   Klebsiella pneumoniae NOT DETECTED NOT DETECTED   Proteus species NOT DETECTED NOT DETECTED   Salmonella species NOT DETECTED NOT DETECTED   Serratia marcescens NOT DETECTED NOT DETECTED   Haemophilus influenzae NOT DETECTED NOT DETECTED   Neisseria meningitidis NOT DETECTED NOT DETECTED   Pseudomonas aeruginosa NOT DETECTED NOT  DETECTED   Stenotrophomonas maltophilia NOT DETECTED NOT DETECTED   Candida albicans NOT DETECTED NOT DETECTED   Candida auris NOT DETECTED NOT DETECTED   Candida glabrata NOT DETECTED NOT DETECTED   Candida krusei NOT DETECTED NOT DETECTED   Candida parapsilosis NOT DETECTED NOT DETECTED   Candida tropicalis NOT DETECTED NOT DETECTED   Cryptococcus neoformans/gattii NOT DETECTED NOT DETECTED   Methicillin resistance mecA/C DETECTED (A) NOT DETECTED    Mosetta Anis 07/06/2023  7:24 AM

## 2023-07-06 NOTE — Progress Notes (Signed)
   07/06/23 1611  TOC Brief Assessment  Insurance and Status Reviewed ([name] PPO)  Patient has primary care physician Yes (Koirala, Dibas, MD)  Home environment has been reviewed From home with spouse  Prior level of function: independent  Prior/Current Home Services No current home services (Outpatient therapy at [facility])  Social Determinants of Health Reivew SDOH reviewed no interventions necessary  Readmission risk has been reviewed Yes (32%)  Transition of care needs transition of care needs identified, TOC will continue to follow   TOC will continue to follow patient for any additional discharge needs  OP therapy set up at [facility] per recommendation

## 2023-07-06 NOTE — Progress Notes (Signed)
PROGRESS NOTE    Hannah Khan  ZOX:[id number] DOB: 1956/07/21 DOA: 07/03/2023 PCP: Darrow Bussing, MD    Chief Complaint  Patient presents with   Hypotension    Brief Narrative:   Hannah Khan is a 67 y.o. female with medical history significant of A-fib not on anticoagulation, CHF, hypertension, type 2 diabetes, CKD stage IIIb, Nash cirrhosis, gastroesophageal varices, chronic thrombocytopenia, anemia, abdominal wall cellulitis, severe morbid obesity. Recently admitted for TIPS creation on 06/30/2023 which was unsuccessful but they were able to perform balloon occlusion retrograde transvenous obliteration of the gastric varix. Postprocedure she developed hypotension requiring pressor support. Also developed A-fib with RVR treated with oral doses of metoprolol and Cardizem. Hospital course further complicated by acute blood loss anemia due to small to moderate volume hemoperitoneum. She was discharged from the hospital yesterday.   Patient presents to the ED this evening for evaluation of nausea, vomiting, and hypotension.  Blood pressure 80/50 on arrival.  In A-fib with heart rate in the 110s to 130s.  Afebrile.  Labs notable for WBC 6.6, hemoglobin 7.3 (was 7.0 on discharge yesterday), platelet count 46k (was 51k yesterday), sodium 136, potassium 3.6, chloride 103, bicarb 21, BUN 32, creatinine 1.8 (was 1.9 yesterday), glucose 205, calcium 8.4, albumin 2.6, AST 105, ALT 59, alk phos 127, T. bili 5.2 (LFTs overall stable since discharge), INR 1.3, lipase 33, lactate 1.6, troponin 41> 50.  EKG showing A-fib and no acute ischemic changes. CT abdomen pelvis showing decrease in size of hemoperitoneum, stable L>R pleural effusions, and no acute findings. EDP discussed the case with Dr. Fredia Sorrow, IR team will see the patient in consultation.  Requested admission by medicine service.  Patient was given Zofran, Oxy IR 5 mg, IV Protonix 40 mg, and 500 mL normal saline.  She was also given ceftriaxone  for possible abdominal wall cellulitis/panniculitis.  Blood pressure improved after fluid bolus.  However, patient complained of shortness of breath and desatted to 85% on room air, placed on 2 L [place].  Chest x-ray pending.   Patient is upset that she has continued to vomit since after her recent TIPS procedure but was still discharged from the hospital yesterday.  She was not able to tolerate any p.o. intake at home.  She is having pain across her upper abdomen since after the procedure but thinks the pain is stable and not any worse.  Denies fevers.  She is endorsing shortness of breath and requesting her home CPAP for sleep apnea.  Denies chest pain.  Assessment & Plan:   Principal Problem:   Nausea and vomiting Active Problems:   Anemia   Cellulitis, abdominal wall   Chronic heart failure with preserved ejection fraction (HFpEF) (HCC)   Thrombocytopenia (HCC)   Type 2 diabetes mellitus (HCC)   Atrial fibrillation with RVR (HCC)   Liver cirrhosis secondary to NASH (HCC)   Chronic hypotension   Hypoxia   Nausea & vomiting     Acute blood loss anemia Hemoperitoneum -Hemoglobin around 12, status post recent attempted TIPS procedures, unsuccessful, with evidence of bleed/hemoperitoneum, hemoglobin down to 6.3 this admission. -So far 3 units PRBC transfusion, hemoglobin has stabilized at 8.4, continue to monitor and transfuse, will keep threshold to transfuse above 8 .  Hypotension -Multifactorial, in the setting of A-fib with RVR, anemia, liver cirrhosis -Continue with midodrine. -Continue with IV albumin -Transfuse PRBC as needed, with target hemoglobin above 8.  Acute on chronic diastolic CHF -She is with evidence of volume overload, but  diuresis is limiting in the setting of low blood pressure, continue with IV Lasix as tolerated, hold metolazone, hold Aldactone for now  Decompensated NASH liver cirrhosis Coagulopathy Thrombocytopenia -The setting of acute blood loss anemia,  platelet count remains low at 25K, will give another unit of platelet today. - give vitamin K given coagulopathy -Continue with rifaximin -She failed TIPS procedure. -Continue with Lasix as tolerated -Continue with lactulose -Will request lower extremity Unna boots given significant edema  Permanent A-fib with RVR -This is in the setting of hypotension, acute blood loss anemia -Difficult to control, cardiology input greatly appreciated, will need to address underlying causes including her anemia, hypertension and volume status -Remains on amiodarone drip, continue as needed for now, continue with digoxin, blood pressure limits AV block oral agents titration -Not a candidate for anticoagulation due to thrombocytopenia, and acute blood loss anemia  Abdominal wall cellulitis/panniculitis -Initially on Rocephin, continue with Keflex.  Hypokalemia -Potassium 3.4, repleted   Hypoxia -Remains on 2 to 3 L nasal cannula. -Bilateral pleural effusion, vascular congestion, hopefully should improve once more stable for diuresis -Encouraged use incentive spirometry and flutter valve -VQ scan negative for PE   Elevated troponin Demand ischemia due to anemia, hypertension, non-ACS pattern, EKG nonacute.  AKI on CKD stage IIIb -Her baseline creatinine around 1.4-1.5, it is 2.03 this admission, she is high risk for hepatorenal syndrome, as well IV fluid is challenging in the setting of her volume status, so we will try to manage with IV albumin, and PRBC transfusion, continue with midodrine    Type 2 diabetes Glucose 205.  Last A1c 4.7 in February 2024, repeat ordered.  Sensitive sliding scale insulin every 4 hours for now until patient's p.o. intake improves.    DVT prophylaxis: SCD Code Status: Full Family Communication: Discussed with husband at bedside 10/16 Disposition:   Status is: Inpatient    Consultants:  cardiolgoy   Subjective:  Lysed weakness, fatigue, reports left hand  pain, will obtain x-ray.  Objective: Vitals:   07/06/23 0420 07/06/23 0805 07/06/23 0902 07/06/23 1228  BP:   104/60 (!) 108/58  Pulse:   (!) 109   Resp:      Temp:    98.4 F (36.9 C)  TempSrc:  Oral  Oral  SpO2:      Weight: (!) 154 kg     Height:        Intake/Output Summary (Last 24 hours) at 07/06/2023 1510 Last data filed at 07/06/2023 0600 Gross per 24 hour  Intake 982.7 ml  Output 300 ml  Net 682.7 ml   Filed Weights   07/04/23 1827 07/06/23 0420  Weight: (!) 151.2 kg (!) 154 kg    Examination:  Awake Alert, Oriented X 3,, frail, ill-appearing no new F.N deficits, Normal affect Symmetrical Chest wall movement, Good air movement bilaterally, CTAB RRR,No Gallops,Rubs or new Murmurs, No Parasternal Heave +ve B.Sounds, Abd Soft, No tenderness, No rebound - guarding or rigidity. No Cyanosis, Clubbing  +3 edema     Data Reviewed: I have personally reviewed following labs and imaging studies  CBC: Recent Labs  Lab 07/02/23 0945 07/03/23 1307 07/04/23 0157 07/04/23 1100 07/05/23 0408 07/06/23 0919  WBC 6.6 6.6 4.3  --  4.2 3.4*  HGB 7.0* 7.3* 6.3* 7.5* 7.7* 8.4*  HCT 20.8* 21.9* 19.8* 22.5* 22.9* 25.5*  MCV 98.6 100.9* 103.1*  --  101.3* 102.0*  PLT 51* 46* 24*  --  22* 25*    Basic Metabolic Panel: Recent Labs  Lab 07/02/23 0945 07/03/23 1307 07/04/23 0157 07/05/23 0408 07/06/23 0919  NA 134* 136 135 135 135  K 3.5 3.6 3.8 3.6 3.4*  CL 102 103 103 101 102  CO2 21* 21* 22 22 21*  GLUCOSE 201* 205* 182* 167* 179*  BUN 31* 32* 34* 28* 26*  CREATININE 1.97* 1.87* 2.03* 1.87* 1.73*  CALCIUM 8.6* 8.4* 8.5* 8.7* 8.7*    GFR: Estimated Creatinine Clearance: 49.1 mL/min (A) (by C-G formula based on SCr of 1.73 mg/dL (H)).  Liver Function Tests: Recent Labs  Lab 07/02/23 0945 07/03/23 1500 07/04/23 0157 07/05/23 0408 07/06/23 0919  AST 140* 105* 83* 56* 43*  ALT 65* 59* 52* 43 36  ALKPHOS 109 127* 123 112 104  BILITOT 5.1* 5.2* 5.2* 4.8*  5.2*  PROT 5.6* 5.8* 5.5* 5.6* 6.0*  ALBUMIN 2.5* 2.6* 2.3* 2.6* 3.1*    CBG: Recent Labs  Lab 07/05/23 2024 07/05/23 2347 07/06/23 0420 07/06/23 0800 07/06/23 1223  GLUCAP 127* 160* 119* 194* 132*     Recent Results (from the past 240 hour(s))  Surgical pcr screen     Status: None   Collection Time: 06/30/23  6:18 AM   Specimen: Nasal Mucosa; Nasal Swab  Result Value Ref Range Status   MRSA, PCR NEGATIVE NEGATIVE Final   Staphylococcus aureus NEGATIVE NEGATIVE Final    Comment: (NOTE) The Xpert SA Assay (FDA approved for NASAL specimens in patients 31 years of age and older), is one component of a comprehensive surveillance program. It is not intended to diagnose infection nor to guide or monitor treatment. Performed at [facility], [address], [place], [place] [address]   Culture, blood (Routine X 2) w Reflex to ID Panel     Status: None (Preliminary result)   Collection Time: 07/04/23  2:02 AM   Specimen: BLOOD RIGHT HAND  Result Value Ref Range Status   Specimen Description BLOOD RIGHT HAND  Final   Special Requests   Final    BOTTLES DRAWN AEROBIC AND ANAEROBIC Blood Culture adequate volume   Culture   Final    NO GROWTH 2 DAYS Performed at [facility], [address], [place], [place] [address]    Report Status PENDING  Incomplete  Culture, blood (Routine X 2) w Reflex to ID Panel     Status: None (Preliminary result)   Collection Time: 07/04/23  2:02 AM   Specimen: BLOOD  Result Value Ref Range Status   Specimen Description BLOOD LEFT ANTECUBITAL  Final   Special Requests   Final    BOTTLES DRAWN AEROBIC AND ANAEROBIC Blood Culture adequate volume   Culture  Setup Time   Final    GRAM POSITIVE COCCI IN CLUSTERS AEROBIC BOTTLE ONLY CRITICAL RESULT CALLED TO, READ BACK BY AND VERIFIED WITH: PHARMD M. BITONTI 07/06/2023 @ 0724 BY AB Performed at [facility], [address], [place], [place] [address]    Culture GRAM POSITIVE COCCI   Final   Report Status PENDING  Incomplete  Blood Culture ID Panel (Reflexed)     Status: Abnormal   Collection Time: 07/04/23  2:02 AM  Result Value Ref Range Status   Enterococcus faecalis NOT DETECTED NOT DETECTED Final   Enterococcus Faecium NOT DETECTED NOT DETECTED Final   Listeria monocytogenes NOT DETECTED NOT DETECTED Final   Staphylococcus species DETECTED (A) NOT DETECTED Final    Comment: CRITICAL RESULT CALLED TO, READ BACK BY AND VERIFIED WITH: PHARMD M. BITONTI 07/06/2023 @  0865 BY AB    Staphylococcus aureus (BCID) NOT DETECTED NOT DETECTED Final   Staphylococcus epidermidis DETECTED (A) NOT DETECTED Final    Comment: Methicillin (oxacillin) resistant coagulase negative staphylococcus. Possible blood culture contaminant (unless isolated from more than one blood culture draw or clinical case suggests pathogenicity). No antibiotic treatment is indicated for blood  culture contaminants. CRITICAL RESULT CALLED TO, READ BACK BY AND VERIFIED WITH: PHARMD M. BITONTI 07/06/2023 @ 0724 BY AB    Staphylococcus lugdunensis NOT DETECTED NOT DETECTED Final   Streptococcus species NOT DETECTED NOT DETECTED Final   Streptococcus agalactiae NOT DETECTED NOT DETECTED Final   Streptococcus pneumoniae NOT DETECTED NOT DETECTED Final   Streptococcus pyogenes NOT DETECTED NOT DETECTED Final   A.calcoaceticus-baumannii NOT DETECTED NOT DETECTED Final   Bacteroides fragilis NOT DETECTED NOT DETECTED Final   Enterobacterales NOT DETECTED NOT DETECTED Final   Enterobacter cloacae complex NOT DETECTED NOT DETECTED Final   Escherichia coli NOT DETECTED NOT DETECTED Final   Klebsiella aerogenes NOT DETECTED NOT DETECTED Final   Klebsiella oxytoca NOT DETECTED NOT DETECTED Final   Klebsiella pneumoniae NOT DETECTED NOT DETECTED Final   Proteus species NOT DETECTED NOT DETECTED Final   Salmonella species NOT DETECTED NOT DETECTED Final   Serratia marcescens NOT DETECTED NOT DETECTED Final    Haemophilus influenzae NOT DETECTED NOT DETECTED Final   Neisseria meningitidis NOT DETECTED NOT DETECTED Final   Pseudomonas aeruginosa NOT DETECTED NOT DETECTED Final   Stenotrophomonas maltophilia NOT DETECTED NOT DETECTED Final   Candida albicans NOT DETECTED NOT DETECTED Final   Candida auris NOT DETECTED NOT DETECTED Final   Candida glabrata NOT DETECTED NOT DETECTED Final   Candida krusei NOT DETECTED NOT DETECTED Final   Candida parapsilosis NOT DETECTED NOT DETECTED Final   Candida tropicalis NOT DETECTED NOT DETECTED Final   Cryptococcus neoformans/gattii NOT DETECTED NOT DETECTED Final   Methicillin resistance mecA/C DETECTED (A) NOT DETECTED Final    Comment: CRITICAL RESULT CALLED TO, READ BACK BY AND VERIFIED WITH: PHARMD M. BITONTI 07/06/2023 @ 0724 BY AB Performed at [facility], [address] [place], [place] [address]   MRSA Next Gen by PCR, Nasal     Status: None   Collection Time: 07/04/23  6:10 AM   Specimen: Nasal Mucosa; Nasal Swab  Result Value Ref Range Status   MRSA by PCR Next Gen NOT DETECTED NOT DETECTED Final    Comment: (NOTE) The GeneXpert MRSA Assay (FDA approved for NASAL specimens only), is one component of a comprehensive MRSA colonization surveillance program. It is not intended to diagnose MRSA infection nor to guide or monitor treatment for MRSA infections. Test performance is not FDA approved in patients less than 62 years old. Performed at [facility], [address], [place], [place] [address]          Radiology Studies: [place] EKG SITE RITE  Result Date: 07/06/2023 If Site Rite image not attached, placement could not be confirmed due to current cardiac rhythm.  NM Pulmonary Perfusion  Result Date: 07/04/2023 CLINICAL DATA:  CHF, shortness of breath and chest pain. EXAM: NUCLEAR MEDICINE PERFUSION LUNG SCAN TECHNIQUE: Perfusion images were obtained in multiple projections after intravenous injection of  radiopharmaceutical. Ventilation scans intentionally deferred if perfusion scan and chest x-ray adequate for interpretation during COVID 19 epidemic. RADIOPHARMACEUTICALS:  4.4 mCi Tc-58m MAA IV COMPARISON:  Chest x-ray 07/03/2019 FINDINGS: Lateral projections are limited due to patient having difficulty tolerating the procedure. Heart is  enlarged with elevation of the left hemidiaphragm. On x-ray there is also some left lung base opacity. Perfusion defect at the left lung base is corresponding to the area of the lung opacity. A few small subsegmental defects elsewhere. IMPRESSION: Conglomeration of findings consistent with a low probability perfusion lung scan. Electronically Signed   By: Karen Kays M.D.   On: 07/04/2023 16:07        Scheduled Meds:  sodium chloride   Intravenous Once   cephALEXin  500 mg Oral Q12H   digoxin  0.125 mg Oral Daily   furosemide  40 mg Intravenous BID   insulin aspart  0-9 Units Subcutaneous Q4H   lactulose  20 g Oral TID   lidocaine  2 patch Transdermal Q24H   metoprolol tartrate  12.5 mg Oral Q6H   midodrine  20 mg Oral TID WC   phytonadione  5 mg Oral Daily   rifaximin  550 mg Oral BID   spironolactone  25 mg Oral Daily   Continuous Infusions:  albumin human 25 g (07/06/23 1442)     LOS: 2 days       Huey Bienenstock, MD [facility]   To contact the attending provider between 7A-7P or the covering provider during after hours 7P-7A, please log into the web site [email or web address] and access using universal [place] password for that web site. If you do not have the password, please call the hospital operator.  07/06/2023, 3:10 PM

## 2023-07-07 DIAGNOSIS — D649 Anemia, unspecified: Secondary | ICD-10-CM | POA: Diagnosis not present

## 2023-07-07 DIAGNOSIS — I5032 Chronic diastolic (congestive) heart failure: Secondary | ICD-10-CM | POA: Diagnosis not present

## 2023-07-07 DIAGNOSIS — I4891 Unspecified atrial fibrillation: Secondary | ICD-10-CM | POA: Diagnosis not present

## 2023-07-07 LAB — PREPARE PLATELET PHERESIS: Unit division: 0

## 2023-07-07 LAB — CULTURE, BLOOD (ROUTINE X 2): Special Requests: ADEQUATE

## 2023-07-07 LAB — BPAM PLATELET PHERESIS
Blood Product Expiration Date: 202410202359
ISSUE DATE / TIME: 202410171654
Unit Type and Rh: 5100

## 2023-07-07 LAB — COMPREHENSIVE METABOLIC PANEL
ALT: 30 U/L (ref 0–44)
AST: 34 U/L (ref 15–41)
Albumin: 3.4 g/dL — ABNORMAL LOW (ref 3.5–5.0)
Alkaline Phosphatase: 86 U/L (ref 38–126)
Anion gap: 10 (ref 5–15)
BUN: 27 mg/dL — ABNORMAL HIGH (ref 8–23)
CO2: 25 mmol/L (ref 22–32)
Calcium: 8.8 mg/dL — ABNORMAL LOW (ref 8.9–10.3)
Chloride: 101 mmol/L (ref 98–111)
Creatinine, Ser: 1.66 mg/dL — ABNORMAL HIGH (ref 0.44–1.00)
GFR, Estimated: 34 mL/min — ABNORMAL LOW (ref >60)
Glucose, Bld: 209 mg/dL — ABNORMAL HIGH (ref 70–99)
Potassium: 3.3 mmol/L — ABNORMAL LOW (ref 3.5–5.1)
Sodium: 136 mmol/L (ref 135–145)
Total Bilirubin: 5.7 mg/dL — ABNORMAL HIGH (ref 0.3–1.2)
Total Protein: 6 g/dL — ABNORMAL LOW (ref 6.5–8.1)

## 2023-07-07 LAB — GLUCOSE, CAPILLARY
Glucose-Capillary: 129 mg/dL — ABNORMAL HIGH (ref 70–99)
Glucose-Capillary: 146 mg/dL — ABNORMAL HIGH (ref 70–99)
Glucose-Capillary: 151 mg/dL — ABNORMAL HIGH (ref 70–99)
Glucose-Capillary: 163 mg/dL — ABNORMAL HIGH (ref 70–99)
Glucose-Capillary: 173 mg/dL — ABNORMAL HIGH (ref 70–99)
Glucose-Capillary: 193 mg/dL — ABNORMAL HIGH (ref 70–99)

## 2023-07-07 LAB — CBC
HCT: 24.2 % — ABNORMAL LOW (ref 36.0–46.0)
Hemoglobin: 8 g/dL — ABNORMAL LOW (ref 12.0–15.0)
MCH: 34.3 pg — ABNORMAL HIGH (ref 26.0–34.0)
MCHC: 33.1 g/dL (ref 30.0–36.0)
MCV: 103.9 fL — ABNORMAL HIGH (ref 80.0–100.0)
Platelets: 19 10*3/uL — CL (ref 150–400)
RBC: 2.33 MIL/uL — ABNORMAL LOW (ref 3.87–5.11)
RDW: 18.9 % — ABNORMAL HIGH (ref 11.5–15.5)
WBC: 3.1 10*3/uL — ABNORMAL LOW (ref 4.0–10.5)
nRBC: 0 % (ref 0.0–0.2)

## 2023-07-07 LAB — DIGOXIN LEVEL: Digoxin Level: 0.4 ng/mL — ABNORMAL LOW (ref 0.8–2.0)

## 2023-07-07 LAB — PREPARE RBC (CROSSMATCH)

## 2023-07-07 MED ORDER — POTASSIUM CHLORIDE CRYS ER 20 MEQ PO TBCR
20.0000 meq | EXTENDED_RELEASE_TABLET | Freq: Once | ORAL | Status: AC
  Administered 2023-07-07: 20 meq via ORAL
  Filled 2023-07-07: qty 1

## 2023-07-07 MED ORDER — SPIRONOLACTONE 25 MG PO TABS: 50.0000 mg | ORAL_TABLET | Freq: Every day | ORAL | Status: DC

## 2023-07-07 MED ORDER — DIGOXIN 125 MCG PO TABS
0.1250 mg | ORAL_TABLET | Freq: Once | ORAL | Status: AC
  Administered 2023-07-07: 0.125 mg via ORAL
  Filled 2023-07-07: qty 1

## 2023-07-07 MED ORDER — DIGOXIN 0.05 MG/ML PO SOLN
0.1250 mg | Freq: Once | ORAL | Status: DC
  Filled 2023-07-07: qty 2.5

## 2023-07-07 MED ORDER — DIGOXIN 0.05 MG/ML PO SOLN
0.2500 mg | Freq: Once | ORAL | Status: DC
Start: 2023-07-07 — End: 2023-07-07

## 2023-07-07 MED ORDER — SODIUM CHLORIDE 0.9% IV SOLUTION: Freq: Once | INTRAVENOUS | Status: AC

## 2023-07-07 MED ORDER — POTASSIUM CHLORIDE CRYS ER 20 MEQ PO TBCR
40.0000 meq | EXTENDED_RELEASE_TABLET | Freq: Once | ORAL | Status: AC
  Administered 2023-07-07: 40 meq via ORAL
  Filled 2023-07-07: qty 2

## 2023-07-07 NOTE — Progress Notes (Signed)
Rounding Note    Patient Name: Hannah Khan Date of Encounter: 07/07/2023  [facility] Cardiologist: Chilton Si, MD   Subjective   Breathing labored today, didn't sleep last night, found flat with CPAP in place  Inpatient Medications    Scheduled Meds:  cephALEXin  500 mg Oral Q12H   Chlorhexidine Gluconate Cloth  6 each Topical Daily   digoxin  0.125 mg Oral Daily   furosemide  40 mg Intravenous BID   insulin aspart  0-9 Units Subcutaneous Q4H   lactulose  20 g Oral TID   lidocaine  2 patch Transdermal Q24H   metoprolol tartrate  12.5 mg Oral Q6H   midodrine  20 mg Oral TID WC   rifaximin  550 mg Oral BID   sodium chloride flush  10-40 mL Intracatheter Q12H   spironolactone  25 mg Oral Daily   Continuous Infusions:  PRN Meds: naLOXone (NARCAN)  injection, ondansetron **OR** ondansetron (ZOFRAN) IV, oxyCODONE, sodium chloride flush   Vital Signs    Vitals:   07/07/23 0308 07/07/23 0318 07/07/23 0803 07/07/23 0847  BP: (!) 116/52   (!) 116/53  Pulse:  (!) 107  (!) 105  Resp:      Temp: 98 F (36.7 C)     TempSrc: Oral  Oral   SpO2: 90%     Weight:  (!) 156.9 kg    Height:        Intake/Output Summary (Last 24 hours) at 07/07/2023 1027 Last data filed at 07/07/2023 0804 Gross per 24 hour  Intake 1624 ml  Output 300 ml  Net 1324 ml      07/07/2023    3:18 AM 07/06/2023    4:20 AM 07/04/2023    6:27 PM  Last 3 Weights  Weight (lbs) 345 lb 14.4 oz 339 lb 8.1 oz 333 lb 5.4 oz  Weight (kg) 156.9 kg 154 kg 151.2 kg      Telemetry    Afib with rates in the 90-110s - Personally Reviewed  ECG    No new tracings - Personally Reviewed  Physical Exam   GEN: obese female with CPAP in place Neck: No JVD - exam difficult Cardiac: irregular rhythm, tachycardic rate, 3/6 systolic murmur Respiratory: Clear to auscultation bilaterally. GI: Soft, nontender, non-distended  MS: B LE edema with chronic skin changes Neuro:  Nonfocal   Psych: Normal affect   Labs    High Sensitivity Troponin:   Recent Labs  Lab 07/03/23 1307 07/03/23 1508 07/04/23 0157  TROPONINIHS 41* 50* 38*     Chemistry Recent Labs  Lab 07/05/23 0408 07/06/23 0919 07/07/23 0852  NA 135 135 136  K 3.6 3.4* 3.3*  CL 101 102 101  CO2 22 21* 25  GLUCOSE 167* 179* 209*  BUN 28* 26* 27*  CREATININE 1.87* 1.73* 1.66*  CALCIUM 8.7* 8.7* 8.8*  PROT 5.6* 6.0* 6.0*  ALBUMIN 2.6* 3.1* 3.4*  AST 56* 43* 34  ALT 43 36 30  ALKPHOS 112 104 86  BILITOT 4.8* 5.2* 5.7*  GFRNONAA 29* 32* 34*  ANIONGAP 12 12 10     Lipids No results for input(s): "CHOL", "TRIG", "HDL", "LABVLDL", "LDLCALC", "CHOLHDL" in the last 168 hours.  Hematology Recent Labs  Lab 07/05/23 0408 07/06/23 0919 07/07/23 0852  WBC 4.2 3.4* 3.1*  RBC 2.26* 2.50* 2.33*  HGB 7.7* 8.4* 8.0*  HCT 22.9* 25.5* 24.2*  MCV 101.3* 102.0* 103.9*  MCH 34.1* 33.6 34.3*  MCHC 33.6 32.9 33.1  RDW  17.2* 18.7* 18.9*  PLT 22* 25* 19*   Thyroid No results for input(s): "TSH", "FREET4" in the last 168 hours.  BNP Recent Labs  Lab 07/04/23 0157  BNP 531.6*    DDimer No results for input(s): "DDIMER" in the last 168 hours.   Radiology    DG Chest Port 1 View  Result Date: 07/06/2023 CLINICAL DATA:  PICC line placement. EXAM: PORTABLE CHEST 1 VIEW COMPARISON:  07/03/2023. FINDINGS: Heart is enlarged and the mediastinal contour is within normal limits. There is atherosclerotic calcification of the aorta. The pulmonary vasculature is distended. Interstitial prominence is present bilaterally. A stable opacity is present at the left lung base. No pneumothorax. A right PICC line terminates over the superior vena cava. No acute osseous abnormality. IMPRESSION: 1. Right PICC line terminates over the superior vena cava. 2. Otherwise stable chest. Electronically Signed   By: Thornell Sartorius M.D.   On: 07/06/2023 19:56   DG Hand 2 View Left  Result Date: 07/06/2023 CLINICAL DATA:  Posterior  hand pain EXAM: LEFT HAND - 2 VIEW COMPARISON:  None Available. FINDINGS: No fracture or malalignment. Joint space narrowing and arthritis at the IP joints. Dorsal soft tissue swelling without foreign body or emphysema IMPRESSION: Dorsal soft tissue swelling. No acute osseous abnormality. Degenerative changes. Electronically Signed   By: Jasmine Pang M.D.   On: 07/06/2023 19:08   [place] EKG SITE RITE  Result Date: 07/06/2023 If Site Rite image not attached, placement could not be confirmed due to current cardiac rhythm.   Cardiac Studies   Echo:   03/07/23   1. Left ventricular ejection fraction, by estimation, is 55 to 60%. The  left ventricle has normal function. The left ventricle has no regional  wall motion abnormalities. Left ventricular diastolic function could not  be evaluated.   2. Right ventricular systolic function is normal. The right ventricular  size is normal. There is mildly elevated pulmonary artery systolic  pressure. The estimated right ventricular systolic pressure is 39.6 mmHg.   3. Left atrial size was severely dilated.   4. Right atrial size was severely dilated.   5. The mitral valve is normal in structure. No evidence of mitral valve  regurgitation.   6. Tricuspid valve regurgitation is moderate.   7. The aortic valve is tricuspid. There is moderate calcification of the  aortic valve. There is moderate thickening of the aortic valve. Aortic  valve regurgitation is not visualized. Mild to moderate aortic valve  stenosis.   8. The inferior vena cava is dilated in size with <50% respiratory  variability, suggesting right atrial pressure of 15 mmHg.   Patient Profile     67 y.o. female  with a hx of HFpEF, morbid obesity, OSA, NASH cirrhosis, esophageal varices, history of hepatic encephalopathy, s/p unsuccessful TIPS 06/30/2023, thrombocytopenia, type 2 diabetes, aortic stenosis, vasovagal syncope, and permanent atrial fibrillation, recurrent panniculitis, cardiology  is following for acute CHF and A fib with RVR. S/P recent unsuccessful TIPS on 06/30/23, complicated by hemoperitoneum on CT post procedure. She came in initially with nausea, vomiting, hypotension, A fib RVR, in the setting of acute blood loss anemia and AKI.   Assessment & Plan    Permanent Afib - RVR initially treated with IV amiodarone - given NASH, would not be a candidate for long term PO amiodarone - suspect RVR related to anemia - not an anticoagulation candidate - continue rate control with severe BAE - IV amiodarone D/C'ed yesterday  - rate control challenging  with hypotension - metoprolol at 12.5 mg q6hr - continue digoxin 0.125 mg daily   Acute diastolic heart failure Pulmonary hypertension - echo with preserved EF - IV diuresis with 40 mg IV lasix  - I&Os not charted with output  Acute on chronic kidney disease 3b - sCr improving, labs pending for today  For questions or updates, please contact [facility] Please consult [email or web address] for contact info under      Signed, Rollene Rotunda, MD  07/07/2023, 10:27 AM    History and all data above reviewed.  Patient examined.  I agree with the findings as above.   Breathing OK with nasal CPAP.  The patient exam reveals [id number]   ,  Lungs: Decreased breath sounds dependent  ,  Abd: Positive bowel sounds, no rebound no guarding, Ext Moderate edema  .  All available labs, radiology testing, previous records reviewed. Agree with documented assessment and plan.   Atrial fib:  Rate is controlled off of amio stopped yesterday.  We will follow to consolidate her beta blocker and decide dose before discharge.   Acute diastolic HR:  She is tolerating the diuretic.  Intake and output not complete and weights are not helpful.  Continue IV Lasix today and possibly change to PO in the AM.  Other meds as listed.    Fayrene Fearing [facility]  10:42 AM  07/07/2023

## 2023-07-07 NOTE — Care Management Important Message (Signed)
Important Message  Patient Details  Name: Hannah Khan MRN: [id number] Date of Birth: [birth year]   Important Message Given:  Yes - Medicare IM     Dorena Bodo 07/07/2023, 3:44 PM

## 2023-07-07 NOTE — Consult Note (Signed)
[facility]   [facility] Inpatient Consult   07/07/2023  Hannah Khan 09/07/1956 [id number]  [facility] [[facility]]  Accountable Care Organization [ACO] Patient: [name]   Primary Care Provider: Darrow Bussing, MD With Deboraha Sprang  this provider is listed for the transition of care follow up appointments    [facility] Liaison was screened for 7 day readmission hospitalization with noted extreme risk score for unplanned readmission risk with 4 hospital admissions in 6 months.  The patient was assessed for potential [facility]) Care Management service needs for post hospital transition for care coordination. Review of patient's electronic medical record reveals patient is currently at the time of review to continue with OP rehab for post hospital. Patient up in recliner nursing at bedside. Husband at bedside on rounds no needs assessed.  Plan: [facility] Liaison will continue to follow progress and disposition to asess for post hospital community care coordination/management needs.  Referral request for community care coordination: to be followed by [facility] Sprang transition of care for post hospital follow up   [facility] Management/Population Health does not replace or interfere with any arrangements made by the Inpatient Transition of Care team.   For questions contact:   Charlesetta Shanks, RN, BSN, CCM [place]  [facility] Liaison Direct Dial: [phone] or secure chat Website: [email or web address]

## 2023-07-07 NOTE — Progress Notes (Signed)
PROGRESS NOTE    Hannah Khan  UJW:[id number] DOB: 1955/11/18 DOA: 07/03/2023 PCP: Darrow Bussing, MD    Chief Complaint  Patient presents with   Hypotension    Brief Narrative:   Hannah Khan is a 67 y.o. female with medical history significant of A-fib not on anticoagulation, CHF, hypertension, type 2 diabetes, CKD stage IIIb, Nash cirrhosis, gastroesophageal varices, chronic thrombocytopenia, anemia, abdominal wall cellulitis, severe morbid obesity. Recently admitted for TIPS creation on 06/30/2023 which was unsuccessful but they were able to perform balloon occlusion retrograde transvenous obliteration of the gastric varix. Postprocedure she developed hypotension requiring pressor support. Also developed A-fib with RVR treated with oral doses of metoprolol and Cardizem. Hospital course further complicated by acute blood loss anemia due to small to moderate volume hemoperitoneum. She was discharged from the hospital, patient presents with nausea, vomiting, hypotension, her workup significant for low hemoglobin at 6.3, as well thrombocytopenia (around baseline, she was noted to be in A-fib with RVR as well, hypotensive, so she was admitted for further workup.     Assessment & Plan:   Principal Problem:   Nausea and vomiting Active Problems:   Anemia   Cellulitis, abdominal wall   Chronic heart failure with preserved ejection fraction (HFpEF) (HCC)   Thrombocytopenia (HCC)   Type 2 diabetes mellitus (HCC)   Atrial fibrillation with RVR (HCC)   Liver cirrhosis secondary to NASH (HCC)   Chronic hypotension   Hypoxia   Nausea & vomiting   Acute blood loss anemia Hemoperitoneum -Baseline hemoglobin around 12, status post recent attempted TIPS procedures during recent hospitalizations, unsuccessful, with evidence of bleed/hemoperitoneum, hemoglobin down to 6.3 this admission. -Hemoglobin has been improving with transfusions, so far required 3 units PRBC, globin today is at 8,  as I have discussed with cardiology, her target should be around 8, as mainly dry for her A-fib with RVR thought to be volume depletion, anemia.    Hypotension -Multifactorial, in the setting of A-fib with RVR, anemia, liver cirrhosis -Continue with midodrine. -Continue with IV albumin -Transfuse PRBC as needed, with target hemoglobin above 8.  Acute on chronic diastolic CHF -She is with evidence of volume overload, diuresis is limited in the setting of hypotension, new with low-dose IV Lasix, low-dose Aldactone, metolazone remains on hold.  Decompensated NASH liver cirrhosis Coagulopathy Thrombocytopenia -In the setting of acute blood loss anemia, blood count is low at 19K today, will give another unit of platelet today (so far 3 units) -Will give vitamin K given coagulopathy -Continue with rifaximin -She failed TIPS procedure. -Continue with Lasix as tolerated -Continue with lactulose -Will request lower extremity Unna boots given significant edema  Permanent A-fib with RVR -This is in the setting of hypotension, acute blood loss anemia -Difficult to control, cardiology input greatly appreciated, will need to address underlying causes including her anemia, hypertension and volume status -Remains on amiodarone drip, continue as needed for now, continue with digoxin, blood pressure limits AV block oral agents titration -Not a candidate for anticoagulation due to thrombocytopenia, and acute blood loss anemia -Digoxin level is low at 0.4 today, will give an extra dose of IV digoxin today hopefully this will improve heart rate.  Abdominal wall cellulitis/panniculitis -Initially on Rocephin>> Keflex, finish total 5 days.  Hypokalemia -Pleated, will increase Aldactone as well as she is on IV diuresis.   Hypoxia -Remains on 2 to 3 L nasal cannula. -Bilateral pleural effusion, vascular congestion, hopefully should improve once more stable for diuresis -Encouraged use incentive  spirometry and flutter valve -VQ scan negative for PE   Elevated troponin Demand ischemia due to anemia, hypertension, non-ACS pattern, EKG nonacute.  AKI on CKD stage IIIb -Her baseline creatinine around 1.4-1.5, it is 2.03 this admission, she is high risk for hepatorenal syndrome, as well IV fluid is challenging in the setting of her volume status, so we will try to manage with IV albumin, and PRBC transfusion, continue with midodrine    Type 2 diabetes Glucose 205.  Last A1c 4.7 in February 2024, repeat ordered.  Sensitive sliding scale insulin every 4 hours for now until patient's p.o. intake improves.    DVT prophylaxis: SCD Code Status: Full Family Communication: Discussed with husband at bedside 10/18 Disposition:   Status is: Inpatient    Consultants:  cardiolgoy   Subjective:  Reports 4 BM yesterday, still reports left hand pain but much improved  Objective: Vitals:   07/07/23 0308 07/07/23 0318 07/07/23 0803 07/07/23 0847  BP: (!) 116/52   (!) 116/53  Pulse:  (!) 107  (!) 105  Resp:      Temp: 98 F (36.7 C)     TempSrc: Oral  Oral   SpO2: 90%     Weight:  (!) 156.9 kg    Height:        Intake/Output Summary (Last 24 hours) at 07/07/2023 1304 Last data filed at 07/07/2023 0804 Gross per 24 hour  Intake 1264 ml  Output --  Net 1264 ml   Filed Weights   07/04/23 1827 07/06/23 0420 07/07/23 0318  Weight: (!) 151.2 kg (!) 154 kg (!) 156.9 kg    Examination:  Awake Alert, Oriented X 3,, frail, ill-appearing no new F.N deficits, Normal affect Symmetrical Chest wall movement, Good air movement bilaterally, CTAB RRR,No Gallops,Rubs or new Murmurs, No Parasternal Heave +ve B.Sounds, Abd Soft, No tenderness, No rebound - guarding or rigidity. No Cyanosis, Clubbing  +3 edema  Awake Alert, Oriented X 3, frail, chronically ill-appearing, multiple bruises Symmetrical Chest wall movement, Minister entry at the bases Irregular,No Gallops,Rubs or new  Murmurs, No Parasternal Heave +ve B.Sounds, Abd Soft, No tenderness, No rebound - guarding or rigidity. No Cyanosis, Clubbing, +3 edema    Data Reviewed: I have personally reviewed following labs and imaging studies  CBC: Recent Labs  Lab 07/03/23 1307 07/04/23 0157 07/04/23 1100 07/05/23 0408 07/06/23 0919 07/07/23 0852  WBC 6.6 4.3  --  4.2 3.4* 3.1*  HGB 7.3* 6.3* 7.5* 7.7* 8.4* 8.0*  HCT 21.9* 19.8* 22.5* 22.9* 25.5* 24.2*  MCV 100.9* 103.1*  --  101.3* 102.0* 103.9*  PLT 46* 24*  --  22* 25* 19*    Basic Metabolic Panel: Recent Labs  Lab 07/03/23 1307 07/04/23 0157 07/05/23 0408 07/06/23 0919 07/07/23 0852  NA 136 135 135 135 136  K 3.6 3.8 3.6 3.4* 3.3*  CL 103 103 101 102 101  CO2 21* 22 22 21* 25  GLUCOSE 205* 182* 167* 179* 209*  BUN 32* 34* 28* 26* 27*  CREATININE 1.87* 2.03* 1.87* 1.73* 1.66*  CALCIUM 8.4* 8.5* 8.7* 8.7* 8.8*    GFR: Estimated Creatinine Clearance: 51.8 mL/min (A) (by C-G formula based on SCr of 1.66 mg/dL (H)).  Liver Function Tests: Recent Labs  Lab 07/03/23 1500 07/04/23 0157 07/05/23 0408 07/06/23 0919 07/07/23 0852  AST 105* 83* 56* 43* 34  ALT 59* 52* 43 36 30  ALKPHOS 127* 123 112 104 86  BILITOT 5.2* 5.2* 4.8* 5.2* 5.7*  PROT 5.8* 5.5* 5.6*  6.0* 6.0*  ALBUMIN 2.6* 2.3* 2.6* 3.1* 3.4*    CBG: Recent Labs  Lab 07/06/23 1541 07/07/23 0019 07/07/23 0306 07/07/23 0803 07/07/23 1113  GLUCAP 152* 163* 129* 193* 151*     Recent Results (from the past 240 hour(s))  Surgical pcr screen     Status: None   Collection Time: 06/30/23  6:18 AM   Specimen: Nasal Mucosa; Nasal Swab  Result Value Ref Range Status   MRSA, PCR NEGATIVE NEGATIVE Final   Staphylococcus aureus NEGATIVE NEGATIVE Final    Comment: (NOTE) The Xpert SA Assay (FDA approved for NASAL specimens in patients 73 years of age and older), is one component of a comprehensive surveillance program. It is not intended to diagnose infection nor to guide  or monitor treatment. Performed at [facility], [address], [place], [place] [address]   Culture, blood (Routine X 2) w Reflex to ID Panel     Status: None (Preliminary result)   Collection Time: 07/04/23  2:02 AM   Specimen: BLOOD RIGHT HAND  Result Value Ref Range Status   Specimen Description BLOOD RIGHT HAND  Final   Special Requests   Final    BOTTLES DRAWN AEROBIC AND ANAEROBIC Blood Culture adequate volume   Culture   Final    NO GROWTH 3 DAYS Performed at [facility], [address] [place], [place] [address]    Report Status PENDING  Incomplete  Culture, blood (Routine X 2) w Reflex to ID Panel     Status: Abnormal   Collection Time: 07/04/23  2:02 AM   Specimen: BLOOD  Result Value Ref Range Status   Specimen Description BLOOD LEFT ANTECUBITAL  Final   Special Requests   Final    BOTTLES DRAWN AEROBIC AND ANAEROBIC Blood Culture adequate volume   Culture  Setup Time   Final    GRAM POSITIVE COCCI IN CLUSTERS AEROBIC BOTTLE ONLY CRITICAL RESULT CALLED TO, READ BACK BY AND VERIFIED WITH: PHARMD M. BITONTI 07/06/2023 @ 0724 BY AB    Culture (A)  Final    STAPHYLOCOCCUS EPIDERMIDIS THE SIGNIFICANCE OF ISOLATING THIS ORGANISM FROM A SINGLE SET OF BLOOD CULTURES WHEN MULTIPLE SETS ARE DRAWN IS UNCERTAIN. PLEASE NOTIFY THE MICROBIOLOGY DEPARTMENT WITHIN ONE WEEK IF SPECIATION AND SENSITIVITIES ARE REQUIRED. Performed at [facility], [address], [place], [place] [address]    Report Status 07/07/2023 FINAL  Final  Blood Culture ID Panel (Reflexed)     Status: Abnormal   Collection Time: 07/04/23  2:02 AM  Result Value Ref Range Status   Enterococcus faecalis NOT DETECTED NOT DETECTED Final   Enterococcus Faecium NOT DETECTED NOT DETECTED Final   Listeria monocytogenes NOT DETECTED NOT DETECTED Final   Staphylococcus species DETECTED (A) NOT DETECTED Final    Comment: CRITICAL RESULT CALLED TO, READ BACK BY AND VERIFIED WITH: PHARMD M. BITONTI  07/06/2023 @ 0724 BY AB    Staphylococcus aureus (BCID) NOT DETECTED NOT DETECTED Final   Staphylococcus epidermidis DETECTED (A) NOT DETECTED Final    Comment: Methicillin (oxacillin) resistant coagulase negative staphylococcus. Possible blood culture contaminant (unless isolated from more than one blood culture draw or clinical case suggests pathogenicity). No antibiotic treatment is indicated for blood  culture contaminants. CRITICAL RESULT CALLED TO, READ BACK BY AND VERIFIED WITH: PHARMD M. BITONTI 07/06/2023 @ 0724 BY AB    Staphylococcus lugdunensis NOT DETECTED NOT DETECTED Final   Streptococcus species NOT DETECTED NOT DETECTED Final  Streptococcus agalactiae NOT DETECTED NOT DETECTED Final   Streptococcus pneumoniae NOT DETECTED NOT DETECTED Final   Streptococcus pyogenes NOT DETECTED NOT DETECTED Final   A.calcoaceticus-baumannii NOT DETECTED NOT DETECTED Final   Bacteroides fragilis NOT DETECTED NOT DETECTED Final   Enterobacterales NOT DETECTED NOT DETECTED Final   Enterobacter cloacae complex NOT DETECTED NOT DETECTED Final   Escherichia coli NOT DETECTED NOT DETECTED Final   Klebsiella aerogenes NOT DETECTED NOT DETECTED Final   Klebsiella oxytoca NOT DETECTED NOT DETECTED Final   Klebsiella pneumoniae NOT DETECTED NOT DETECTED Final   Proteus species NOT DETECTED NOT DETECTED Final   Salmonella species NOT DETECTED NOT DETECTED Final   Serratia marcescens NOT DETECTED NOT DETECTED Final   Haemophilus influenzae NOT DETECTED NOT DETECTED Final   Neisseria meningitidis NOT DETECTED NOT DETECTED Final   Pseudomonas aeruginosa NOT DETECTED NOT DETECTED Final   Stenotrophomonas maltophilia NOT DETECTED NOT DETECTED Final   Candida albicans NOT DETECTED NOT DETECTED Final   Candida auris NOT DETECTED NOT DETECTED Final   Candida glabrata NOT DETECTED NOT DETECTED Final   Candida krusei NOT DETECTED NOT DETECTED Final   Candida parapsilosis NOT DETECTED NOT DETECTED Final    Candida tropicalis NOT DETECTED NOT DETECTED Final   Cryptococcus neoformans/gattii NOT DETECTED NOT DETECTED Final   Methicillin resistance mecA/C DETECTED (A) NOT DETECTED Final    Comment: CRITICAL RESULT CALLED TO, READ BACK BY AND VERIFIED WITH: PHARMD M. BITONTI 07/06/2023 @ 0724 BY AB Performed at [facility], [address] [place], [place] [address]   MRSA Next Gen by PCR, Nasal     Status: None   Collection Time: 07/04/23  6:10 AM   Specimen: Nasal Mucosa; Nasal Swab  Result Value Ref Range Status   MRSA by PCR Next Gen NOT DETECTED NOT DETECTED Final    Comment: (NOTE) The GeneXpert MRSA Assay (FDA approved for NASAL specimens only), is one component of a comprehensive MRSA colonization surveillance program. It is not intended to diagnose MRSA infection nor to guide or monitor treatment for MRSA infections. Test performance is not FDA approved in patients less than 66 years old. Performed at [facility], [address] [place], [place] [address]          Radiology Studies: DG Chest Port 1 View  Result Date: 07/06/2023 CLINICAL DATA:  PICC line placement. EXAM: PORTABLE CHEST 1 VIEW COMPARISON:  07/03/2023. FINDINGS: Heart is enlarged and the mediastinal contour is within normal limits. There is atherosclerotic calcification of the aorta. The pulmonary vasculature is distended. Interstitial prominence is present bilaterally. A stable opacity is present at the left lung base. No pneumothorax. A right PICC line terminates over the superior vena cava. No acute osseous abnormality. IMPRESSION: 1. Right PICC line terminates over the superior vena cava. 2. Otherwise stable chest. Electronically Signed   By: Thornell Sartorius M.D.   On: 07/06/2023 19:56   DG Hand 2 View Left  Result Date: 07/06/2023 CLINICAL DATA:  Posterior hand pain EXAM: LEFT HAND - 2 VIEW COMPARISON:  None Available. FINDINGS: No fracture or malalignment. Joint space narrowing and arthritis  at the IP joints. Dorsal soft tissue swelling without foreign body or emphysema IMPRESSION: Dorsal soft tissue swelling. No acute osseous abnormality. Degenerative changes. Electronically Signed   By: Jasmine Pang M.D.   On: 07/06/2023 19:08   [place] EKG SITE RITE  Result Date: 07/06/2023 If Site Rite image not attached, placement could not be confirmed due to  current cardiac rhythm.       Scheduled Meds:  sodium chloride   Intravenous Once   Chlorhexidine Gluconate Cloth  6 each Topical Daily   digoxin  0.125 mg Oral Once   digoxin  0.125 mg Oral Daily   furosemide  40 mg Intravenous BID   insulin aspart  0-9 Units Subcutaneous Q4H   lactulose  20 g Oral TID   lidocaine  2 patch Transdermal Q24H   metoprolol tartrate  12.5 mg Oral Q6H   midodrine  20 mg Oral TID WC   rifaximin  550 mg Oral BID   sodium chloride flush  10-40 mL Intracatheter Q12H   spironolactone  25 mg Oral Daily   Continuous Infusions:     LOS: 3 days       Huey Bienenstock, MD [facility]   To contact the attending provider between 7A-7P or the covering provider during after hours 7P-7A, please log into the web site [email or web address] and access using universal [place] password for that web site. If you do not have the password, please call the hospital operator.  07/07/2023, 1:04 PM

## 2023-07-07 NOTE — Progress Notes (Addendum)
error 

## 2023-07-07 NOTE — Progress Notes (Signed)
Pt has received 5 days of abx for cellulitis. Ok to stop keflex per Dr Randol Kern.  Ulyses Southward, PharmD, BCIDP, AAHIVP, CPP Infectious Disease Pharmacist 07/07/2023 12:44 PM

## 2023-07-07 NOTE — Plan of Care (Signed)
  Problem: Fluid Volume: Goal: Hemodynamic stability will improve Outcome: Progressing   Problem: Clinical Measurements: Goal: Diagnostic test results will improve Outcome: Progressing Goal: Signs and symptoms of infection will decrease Outcome: Progressing   Problem: Respiratory: Goal: Ability to maintain adequate ventilation will improve Outcome: Progressing   Problem: Education: Goal: Understanding of CV disease, CV risk reduction, and recovery process will improve Outcome: Progressing Goal: Individualized Educational Video(s) Outcome: Progressing   Problem: Activity: Goal: Ability to return to baseline activity level will improve Outcome: Progressing   Problem: Cardiovascular: Goal: Ability to achieve and maintain adequate cardiovascular perfusion will improve Outcome: Progressing Goal: Vascular access site(s) Level 0-1 will be maintained Outcome: Progressing   Problem: Health Behavior/Discharge Planning: Goal: Ability to safely manage health-related needs after discharge will improve Outcome: Progressing   Problem: Education: Goal: Ability to describe self-care measures that may prevent or decrease complications (Diabetes Survival Skills Education) will improve Outcome: Progressing Goal: Individualized Educational Video(s) Outcome: Progressing   Problem: Coping: Goal: Ability to adjust to condition or change in health will improve Outcome: Progressing   Problem: Fluid Volume: Goal: Ability to maintain a balanced intake and output will improve Outcome: Progressing   Problem: Health Behavior/Discharge Planning: Goal: Ability to identify and utilize available resources and services will improve Outcome: Progressing Goal: Ability to manage health-related needs will improve Outcome: Progressing   Problem: Metabolic: Goal: Ability to maintain appropriate glucose levels will improve Outcome: Progressing   Problem: Nutritional: Goal: Maintenance of adequate  nutrition will improve Outcome: Progressing Goal: Progress toward achieving an optimal weight will improve Outcome: Progressing   Problem: Skin Integrity: Goal: Risk for impaired skin integrity will decrease Outcome: Progressing   Problem: Tissue Perfusion: Goal: Adequacy of tissue perfusion will improve Outcome: Progressing   Problem: Education: Goal: Knowledge of General Education information will improve Description: Including pain rating scale, medication(s)/side effects and non-pharmacologic comfort measures Outcome: Progressing   Problem: Health Behavior/Discharge Planning: Goal: Ability to manage health-related needs will improve Outcome: Progressing   Problem: Clinical Measurements: Goal: Ability to maintain clinical measurements within normal limits will improve Outcome: Progressing Goal: Will remain free from infection Outcome: Progressing Goal: Diagnostic test results will improve Outcome: Progressing Goal: Respiratory complications will improve Outcome: Progressing Goal: Cardiovascular complication will be avoided Outcome: Progressing   Problem: Activity: Goal: Risk for activity intolerance will decrease Outcome: Progressing   Problem: Nutrition: Goal: Adequate nutrition will be maintained Outcome: Progressing   Problem: Coping: Goal: Level of anxiety will decrease Outcome: Progressing   Problem: Elimination: Goal: Will not experience complications related to bowel motility Outcome: Progressing Goal: Will not experience complications related to urinary retention Outcome: Progressing   Problem: Pain Managment: Goal: General experience of comfort will improve Outcome: Progressing   Problem: Safety: Goal: Ability to remain free from injury will improve Outcome: Progressing   Problem: Skin Integrity: Goal: Risk for impaired skin integrity will decrease Outcome: Progressing   Problem: Education: Goal: Understanding of post-operative needs will  improve Outcome: Progressing Goal: Individualized Educational Video(s) Outcome: Progressing   Problem: Clinical Measurements: Goal: Postoperative complications will be avoided or minimized Outcome: Progressing   Problem: Respiratory: Goal: Will regain and/or maintain adequate ventilation Outcome: Progressing

## 2023-07-07 NOTE — Evaluation (Signed)
Occupational Therapy Evaluation Patient Details Name: Hannah Khan MRN: [id number] DOB: 1955/12/01 Today's Date: 07/07/2023   History of Present Illness Patient is a 67 year old female with recent hospital stay with attempted TIPS procedure, nausea and vomiting, hypotension, acute blood loss anemia, hemoperitoneum.  Permanent A-fib with RVR, decompensated NASH liver cirrhosis, thrombocytopenia, abdominal wall cellulitis/panniculitis, hypoxia.   Clinical Impression   Pt admitted for above, today's session limited due to fatigue and setup of platelet transfusion. Pt completed STS with CGA but unsteady without UE support to which she is aware of. Educated pt on energy conservation with handout provided, pt spouse present and supportive, he helps with bathing and dressing at baseline. Pt currently needs significant assist with LB ADLs which may be baseline, does report decreased activity tolerance from baseline. Pt would benefit from continued acute skilled OT services to address deficits and help transition to next level of care. Anticipate no post acute OT needed.       If plan is discharge home, recommend the following: A lot of help with bathing/dressing/bathroom;Assistance with cooking/housework    Functional Status Assessment  Patient has had a recent decline in their functional status and demonstrates the ability to make significant improvements in function in a reasonable and predictable amount of time.  Equipment Recommendations  None recommended by OT    Recommendations for Other Services       Precautions / Restrictions Precautions Precautions: Fall Restrictions Weight Bearing Restrictions: No      Mobility Bed Mobility               General bed mobility comments: in recliner on arrival    Transfers Overall transfer level: Needs assistance Equipment used: None Transfers: Sit to/from Stand Sit to Stand: Contact guard assist           General transfer  comment: Unsteady without AD but fair bal      Balance Overall balance assessment: Needs assistance     Sitting balance - Comments: in recliner, not fully assessed   Standing balance support: No upper extremity supported Standing balance-Leahy Scale: Fair Standing balance comment: static standing no AD                           ADL either performed or assessed with clinical judgement   ADL Overall ADL's : Needs assistance/impaired Eating/Feeding: Independent;Sitting   Grooming: Sitting;Set up   Upper Body Bathing: Sitting;Set up   Lower Body Bathing: Sitting/lateral leans;Moderate assistance Lower Body Bathing Details (indicate cue type and reason): increased challenge reaching BLEs Upper Body Dressing : Sitting;Set up   Lower Body Dressing: Maximal assistance;Sit to/from stand;Sitting/lateral leans   Toilet Transfer: Contact guard assist;Rolling walker (2 wheels) Toilet Transfer Details (indicate cue type and reason): sim with mobility standing by recliner Toileting- Clothing Manipulation and Hygiene: Moderate assistance Toileting - Clothing Manipulation Details (indicate cue type and reason): uses AE at baseline- bottom buddy       General ADL Comments: Limited session to standing at recliner due to pt about ot receive platlets and being very fatigued before sesion. Briefly educated pt on energy conservation strategeis, left handout     Vision         Perception         Praxis         Pertinent Vitals/Pain Pain Assessment Pain Assessment: No/denies pain     Extremity/Trunk Assessment Upper Extremity Assessment Upper Extremity Assessment: Overall WFL for tasks  assessed;RUE deficits/detail RUE Deficits / Details: Hx of rotator injury, not able to touch top of head. Shoulder flexion limited to ~80*   Lower Extremity Assessment Lower Extremity Assessment: Generalized weakness       Communication Communication Communication: No apparent  difficulties   Cognition Arousal: Alert Behavior During Therapy: WFL for tasks assessed/performed Overall Cognitive Status: Within Functional Limits for tasks assessed                                       General Comments  HR in low 100s, RN in room setting up platlets,    Exercises     Shoulder Instructions      Home Living Family/patient expects to be discharged to:: Private residence Living Arrangements: Spouse/significant other Available Help at Discharge: Family;Available 24 hours/day Type of Home: House Home Access: Stairs to enter [employer] of Steps: 1-2 Entrance Stairs-Rails: None Home Layout: One level     Bathroom Shower/Tub: Chief Strategy Officer: Standard Bathroom Accessibility:  (rollator does not fit in the bathroom)   Home Equipment: Rollator (4 wheels);Tub bench;BSC/3in1   Additional Comments: CPAP at night. has an adjustable bed. Reports just one fall in the last year      Prior Functioning/Environment Prior Level of Function : Independent/Modified Independent             Mobility Comments: rollator, limited distance ambulation ADLs Comments: spouse does cooking, cleaning, driving,        OT Problem List: Impaired balance (sitting and/or standing)      OT Treatment/Interventions: Self-care/ADL training;Balance training;Therapeutic exercise;Therapeutic activities;Patient/family education    OT Goals(Current goals can be found in the care plan section) Acute Rehab OT Goals Patient Stated Goal: To hopefully go home OT Goal Formulation: With patient/family Time For Goal Achievement: 07/21/23 Potential to Achieve Goals: Good ADL Goals Pt Will Perform Grooming: with modified independence;standing Pt Will Transfer to Toilet: with supervision;ambulating Pt Will Perform Toileting - Clothing Manipulation and hygiene: with supervision;sit to/from stand;with adaptive equipment Additional ADL Goal #1: Pt  will demonstrate independent use of energy conservation strategies prn to complete functional tasks  OT Frequency: Min 1X/week    Co-evaluation              AM-PAC OT "6 Clicks" Daily Activity     Outcome Measure Help from another person eating meals?: None Help from another person taking care of personal grooming?: A Little Help from another person toileting, which includes using toliet, bedpan, or urinal?: A Lot Help from another person bathing (including washing, rinsing, drying)?: A Lot Help from another person to put on and taking off regular upper body clothing?: A Little Help from another person to put on and taking off regular lower body clothing?: A Lot 6 Click Score: 16   End of Session Nurse Communication: Mobility status  Activity Tolerance: Patient tolerated treatment well Patient left: in chair;with chair alarm set;with call bell/phone within reach  OT Visit Diagnosis: Unsteadiness on feet (R26.81)                Time: 1500-1520 OT Time Calculation (min): 20 min Charges:  OT General Charges $OT Visit: 1 Visit OT Evaluation $OT Eval Low Complexity: 1 Low  07/07/2023  AB, OTR/L  Acute Rehabilitation Services  Office: [phone]   Tristan Schroeder 07/07/2023, 4:48 PM

## 2023-07-07 NOTE — Progress Notes (Signed)
Order placed for IV team to be consulted to draw blood from PICC line.

## 2023-07-07 NOTE — Plan of Care (Signed)
Pt has rested quietly throughout the night with no distress noted. Alert and oriented. On O2 3.5LNC or CPAP while sleeping. A-fib on the monitor. Pt has gone back and forth from recliner to bed. Up to [facility] to void and have BM's. No complaints voiced.     Problem: Fluid Volume: Goal: Hemodynamic stability will improve Outcome: Progressing   Problem: Clinical Measurements: Goal: Diagnostic test results will improve Outcome: Progressing Goal: Signs and symptoms of infection will decrease Outcome: Progressing   Problem: Respiratory: Goal: Ability to maintain adequate ventilation will improve Outcome: Progressing   Problem: Education: Goal: Understanding of CV disease, CV risk reduction, and recovery process will improve Outcome: Progressing Goal: Individualized Educational Video(s) Outcome: Progressing   Problem: Activity: Goal: Ability to return to baseline activity level will improve Outcome: Progressing   Problem: Cardiovascular: Goal: Ability to achieve and maintain adequate cardiovascular perfusion will improve Outcome: Progressing Goal: Vascular access site(s) Level 0-1 will be maintained Outcome: Progressing   Problem: Health Behavior/Discharge Planning: Goal: Ability to safely manage health-related needs after discharge will improve Outcome: Progressing   Problem: Education: Goal: Ability to describe self-care measures that may prevent or decrease complications (Diabetes Survival Skills Education) will improve Outcome: Progressing Goal: Individualized Educational Video(s) Outcome: Progressing   Problem: Coping: Goal: Ability to adjust to condition or change in health will improve Outcome: Progressing   Problem: Fluid Volume: Goal: Ability to maintain a balanced intake and output will improve Outcome: Progressing   Problem: Health Behavior/Discharge Planning: Goal: Ability to identify and utilize available resources and services will improve Outcome:  Progressing Goal: Ability to manage health-related needs will improve Outcome: Progressing   Problem: Metabolic: Goal: Ability to maintain appropriate glucose levels will improve Outcome: Progressing   Problem: Nutritional: Goal: Maintenance of adequate nutrition will improve Outcome: Progressing Goal: Progress toward achieving an optimal weight will improve Outcome: Progressing   Problem: Skin Integrity: Goal: Risk for impaired skin integrity will decrease Outcome: Progressing   Problem: Tissue Perfusion: Goal: Adequacy of tissue perfusion will improve Outcome: Progressing   Problem: Education: Goal: Knowledge of General Education information will improve Description: Including pain rating scale, medication(s)/side effects and non-pharmacologic comfort measures Outcome: Progressing   Problem: Health Behavior/Discharge Planning: Goal: Ability to manage health-related needs will improve Outcome: Progressing   Problem: Clinical Measurements: Goal: Ability to maintain clinical measurements within normal limits will improve Outcome: Progressing Goal: Will remain free from infection Outcome: Progressing Goal: Diagnostic test results will improve Outcome: Progressing Goal: Respiratory complications will improve Outcome: Progressing Goal: Cardiovascular complication will be avoided Outcome: Progressing   Problem: Activity: Goal: Risk for activity intolerance will decrease Outcome: Progressing   Problem: Nutrition: Goal: Adequate nutrition will be maintained Outcome: Progressing   Problem: Coping: Goal: Level of anxiety will decrease Outcome: Progressing   Problem: Elimination: Goal: Will not experience complications related to bowel motility Outcome: Progressing Goal: Will not experience complications related to urinary retention Outcome: Progressing   Problem: Pain Managment: Goal: General experience of comfort will improve Outcome: Progressing   Problem:  Safety: Goal: Ability to remain free from injury will improve Outcome: Progressing   Problem: Skin Integrity: Goal: Risk for impaired skin integrity will decrease Outcome: Progressing   Problem: Education: Goal: Understanding of post-operative needs will improve Outcome: Progressing Goal: Individualized Educational Video(s) Outcome: Progressing   Problem: Clinical Measurements: Goal: Postoperative complications will be avoided or minimized Outcome: Progressing   Problem: Respiratory: Goal: Will regain and/or maintain adequate ventilation Outcome: Progressing

## 2023-07-07 NOTE — Progress Notes (Signed)
IV team paged for blood draw from PICC.

## 2023-07-08 ENCOUNTER — Inpatient Hospital Stay (HOSPITAL_COMMUNITY): Payer: Medicare PPO

## 2023-07-08 DIAGNOSIS — I9589 Other hypotension: Secondary | ICD-10-CM

## 2023-07-08 DIAGNOSIS — I5032 Chronic diastolic (congestive) heart failure: Secondary | ICD-10-CM | POA: Diagnosis not present

## 2023-07-08 DIAGNOSIS — D649 Anemia, unspecified: Secondary | ICD-10-CM | POA: Diagnosis not present

## 2023-07-08 DIAGNOSIS — I5033 Acute on chronic diastolic (congestive) heart failure: Secondary | ICD-10-CM

## 2023-07-08 DIAGNOSIS — I4891 Unspecified atrial fibrillation: Secondary | ICD-10-CM | POA: Diagnosis not present

## 2023-07-08 LAB — CBC
HCT: 25.8 % — ABNORMAL LOW (ref 36.0–46.0)
Hemoglobin: 8.4 g/dL — ABNORMAL LOW (ref 12.0–15.0)
MCH: 33.1 pg (ref 26.0–34.0)
MCHC: 32.6 g/dL (ref 30.0–36.0)
MCV: 101.6 fL — ABNORMAL HIGH (ref 80.0–100.0)
Platelets: 24 10*3/uL — CL (ref 150–400)
RBC: 2.54 MIL/uL — ABNORMAL LOW (ref 3.87–5.11)
RDW: 18.8 % — ABNORMAL HIGH (ref 11.5–15.5)
WBC: 3.2 10*3/uL — ABNORMAL LOW (ref 4.0–10.5)
nRBC: 0 % (ref 0.0–0.2)

## 2023-07-08 LAB — GLUCOSE, CAPILLARY
Glucose-Capillary: 141 mg/dL — ABNORMAL HIGH (ref 70–99)
Glucose-Capillary: 140 mg/dL — ABNORMAL HIGH (ref 70–99)
Glucose-Capillary: 189 mg/dL — ABNORMAL HIGH (ref 70–99)
Glucose-Capillary: 164 mg/dL — ABNORMAL HIGH (ref 70–99)
Glucose-Capillary: 157 mg/dL — ABNORMAL HIGH (ref 70–99)
Glucose-Capillary: 182 mg/dL — ABNORMAL HIGH (ref 70–99)

## 2023-07-08 LAB — PREPARE PLATELET PHERESIS: Unit division: 0

## 2023-07-08 LAB — COMPREHENSIVE METABOLIC PANEL
ALT: 28 U/L (ref 0–44)
AST: 31 U/L (ref 15–41)
Albumin: 3.2 g/dL — ABNORMAL LOW (ref 3.5–5.0)
Alkaline Phosphatase: 85 U/L (ref 38–126)
Anion gap: 15 (ref 5–15)
BUN: 28 mg/dL — ABNORMAL HIGH (ref 8–23)
CO2: 24 mmol/L (ref 22–32)
Calcium: 9.8 mg/dL (ref 8.9–10.3)
Chloride: 100 mmol/L (ref 98–111)
Creatinine, Ser: 1.53 mg/dL — ABNORMAL HIGH (ref 0.44–1.00)
GFR, Estimated: 37 mL/min — ABNORMAL LOW (ref >60)
Glucose, Bld: 149 mg/dL — ABNORMAL HIGH (ref 70–99)
Potassium: 3.4 mmol/L — ABNORMAL LOW (ref 3.5–5.1)
Sodium: 139 mmol/L (ref 135–145)
Total Bilirubin: 6.5 mg/dL — ABNORMAL HIGH (ref 0.3–1.2)
Total Protein: 5.8 g/dL — ABNORMAL LOW (ref 6.5–8.1)

## 2023-07-08 LAB — BPAM PLATELET PHERESIS
Blood Product Expiration Date: 202410202359
ISSUE DATE / TIME: 202410181504
Unit Type and Rh: 6200

## 2023-07-08 LAB — BRAIN NATRIURETIC PEPTIDE: B Natriuretic Peptide: 926.8 pg/mL — ABNORMAL HIGH (ref 0.0–100.0)

## 2023-07-08 MED ORDER — METOPROLOL TARTRATE 25 MG PO TABS
25.0000 mg | ORAL_TABLET | Freq: Two times a day (BID) | ORAL | Status: DC
  Administered 2023-07-08 – 2023-07-11 (×6): 25 mg via ORAL
  Filled 2023-07-08 (×6): qty 1

## 2023-07-08 MED ORDER — DIGOXIN 125 MCG PO TABS
0.2500 mg | ORAL_TABLET | Freq: Every day | ORAL | Status: DC
  Administered 2023-07-09 – 2023-07-11 (×3): 0.25 mg via ORAL
  Filled 2023-07-08 (×3): qty 2

## 2023-07-08 MED ORDER — FUROSEMIDE 10 MG/ML IJ SOLN
120.0000 mg | Freq: Every day | INTRAVENOUS | Status: DC
  Filled 2023-07-08: qty 12

## 2023-07-08 MED ORDER — FUROSEMIDE 10 MG/ML IJ SOLN
80.0000 mg | Freq: Two times a day (BID) | INTRAMUSCULAR | Status: DC
  Filled 2023-07-08: qty 8

## 2023-07-08 MED ORDER — SPIRONOLACTONE 25 MG PO TABS
100.0000 mg | ORAL_TABLET | Freq: Every day | ORAL | Status: DC
  Administered 2023-07-09 – 2023-07-11 (×3): 100 mg via ORAL
  Filled 2023-07-08 (×4): qty 4

## 2023-07-08 MED ORDER — FUROSEMIDE 10 MG/ML IJ SOLN
120.0000 mg | Freq: Every day | INTRAVENOUS | Status: DC
Start: 2023-07-08 — End: 2023-07-11
  Administered 2023-07-08 – 2023-07-10 (×3): 120 mg via INTRAVENOUS
  Filled 2023-07-08: qty 10
  Filled 2023-07-08: qty 12
  Filled 2023-07-08 (×2): qty 10

## 2023-07-08 MED ORDER — POTASSIUM CHLORIDE CRYS ER 20 MEQ PO TBCR
40.0000 meq | EXTENDED_RELEASE_TABLET | Freq: Four times a day (QID) | ORAL | Status: AC
  Administered 2023-07-08 (×2): 40 meq via ORAL
  Filled 2023-07-08 (×2): qty 2

## 2023-07-08 MED ORDER — ORAL CARE MOUTH RINSE: 15.0000 mL | OROMUCOSAL | Status: DC | PRN

## 2023-07-08 NOTE — Plan of Care (Signed)
Pt has rested quietly throughout the night with no distress noted. Alert and oriented. On O2 3LNC while awake and CPAP with O2 for sleep. A-fib on the monitor. Up to [facility] to void. Medicated for back pain with relief noted. Pt turns self in bed. No other complaints voiced.     Problem: Fluid Volume: Goal: Hemodynamic stability will improve Outcome: Progressing   Problem: Clinical Measurements: Goal: Diagnostic test results will improve Outcome: Progressing Goal: Signs and symptoms of infection will decrease Outcome: Progressing   Problem: Respiratory: Goal: Ability to maintain adequate ventilation will improve Outcome: Progressing   Problem: Education: Goal: Understanding of CV disease, CV risk reduction, and recovery process will improve Outcome: Progressing Goal: Individualized Educational Video(s) Outcome: Progressing   Problem: Activity: Goal: Ability to return to baseline activity level will improve Outcome: Progressing   Problem: Cardiovascular: Goal: Ability to achieve and maintain adequate cardiovascular perfusion will improve Outcome: Progressing Goal: Vascular access site(s) Level 0-1 will be maintained Outcome: Progressing   Problem: Health Behavior/Discharge Planning: Goal: Ability to safely manage health-related needs after discharge will improve Outcome: Progressing   Problem: Education: Goal: Ability to describe self-care measures that may prevent or decrease complications (Diabetes Survival Skills Education) will improve Outcome: Progressing Goal: Individualized Educational Video(s) Outcome: Progressing   Problem: Coping: Goal: Ability to adjust to condition or change in health will improve Outcome: Progressing   Problem: Fluid Volume: Goal: Ability to maintain a balanced intake and output will improve Outcome: Progressing   Problem: Health Behavior/Discharge Planning: Goal: Ability to identify and utilize available resources and services will  improve Outcome: Progressing Goal: Ability to manage health-related needs will improve Outcome: Progressing   Problem: Metabolic: Goal: Ability to maintain appropriate glucose levels will improve Outcome: Progressing   Problem: Nutritional: Goal: Maintenance of adequate nutrition will improve Outcome: Progressing Goal: Progress toward achieving an optimal weight will improve Outcome: Progressing   Problem: Skin Integrity: Goal: Risk for impaired skin integrity will decrease Outcome: Progressing   Problem: Tissue Perfusion: Goal: Adequacy of tissue perfusion will improve Outcome: Progressing   Problem: Education: Goal: Knowledge of General Education information will improve Description: Including pain rating scale, medication(s)/side effects and non-pharmacologic comfort measures Outcome: Progressing   Problem: Health Behavior/Discharge Planning: Goal: Ability to manage health-related needs will improve Outcome: Progressing   Problem: Clinical Measurements: Goal: Ability to maintain clinical measurements within normal limits will improve Outcome: Progressing Goal: Will remain free from infection Outcome: Progressing Goal: Diagnostic test results will improve Outcome: Progressing Goal: Respiratory complications will improve Outcome: Progressing Goal: Cardiovascular complication will be avoided Outcome: Progressing   Problem: Activity: Goal: Risk for activity intolerance will decrease Outcome: Progressing   Problem: Nutrition: Goal: Adequate nutrition will be maintained Outcome: Progressing   Problem: Coping: Goal: Level of anxiety will decrease Outcome: Progressing   Problem: Elimination: Goal: Will not experience complications related to bowel motility Outcome: Progressing Goal: Will not experience complications related to urinary retention Outcome: Progressing   Problem: Pain Managment: Goal: General experience of comfort will improve Outcome:  Progressing   Problem: Safety: Goal: Ability to remain free from injury will improve Outcome: Progressing   Problem: Skin Integrity: Goal: Risk for impaired skin integrity will decrease Outcome: Progressing

## 2023-07-08 NOTE — Progress Notes (Signed)
Progress Note  Patient Name: Hannah Khan Date of Encounter: 07/08/2023  Primary Cardiologist:   Chilton Si, MD   Subjective   She is week.  BP drops with upright posture.  Breathing is still not at baseline.  No new pain.   Inpatient Medications    Scheduled Meds:  Chlorhexidine Gluconate Cloth  6 each Topical Daily   digoxin  0.125 mg Oral Daily   furosemide  80 mg Intravenous BID   insulin aspart  0-9 Units Subcutaneous Q4H   lactulose  20 g Oral TID   lidocaine  2 patch Transdermal Q24H   metoprolol tartrate  12.5 mg Oral Q6H   midodrine  20 mg Oral TID WC   potassium chloride  40 mEq Oral Q6H   rifaximin  550 mg Oral BID   sodium chloride flush  10-40 mL Intracatheter Q12H   spironolactone  100 mg Oral Daily   Continuous Infusions:  PRN Meds: naLOXone (NARCAN)  injection, ondansetron **OR** ondansetron (ZOFRAN) IV, mouth rinse, oxyCODONE, sodium chloride flush   Vital Signs    Vitals:   07/08/23 0300 07/08/23 0313 07/08/23 0400 07/08/23 0600  BP:  (!) 107/57    Pulse:      Resp: 20 20 20 20   Temp:  98.4 F (36.9 C)    TempSrc:  Oral    SpO2:  93%    Weight:      Height:        Intake/Output Summary (Last 24 hours) at 07/08/2023 0910 Last data filed at 07/08/2023 0600 Gross per 24 hour  Intake 1301 ml  Output --  Net 1301 ml   Filed Weights   07/04/23 1827 07/06/23 0420 07/07/23 0318  Weight: (!) 151.2 kg (!) 154 kg (!) 156.9 kg    Telemetry    Atrial fib with relatively controlled rate - Personally Reviewed  ECG    NA - Personally Reviewed  Physical Exam   GEN: No acute distress.   Neck: No  JVD Cardiac: Irregular RR, 3/6 apical systolic murmur, no diastolic murmurs, rubs, or gallops.  Respiratory: Clear  to auscultation bilaterally. GI: Soft, nontender, non-distended  MS:    Severe bilateral leg edema; No deformity. Neuro:  Nonfocal Psych: Normal affect   Labs    Chemistry Recent Labs  Lab 07/06/23 0919  07/07/23 0852 07/08/23 0401  NA 135 136 139  K 3.4* 3.3* 3.4*  CL 102 101 100  CO2 21* 25 24  GLUCOSE 179* 209* 149*  BUN 26* 27* 28*  CREATININE 1.73* 1.66* 1.53*  CALCIUM 8.7* 8.8* 9.8  PROT 6.0* 6.0* 5.8*  ALBUMIN 3.1* 3.4* 3.2*  AST 43* 34 31  ALT 36 30 28  ALKPHOS 104 86 85  BILITOT 5.2* 5.7* 6.5*  GFRNONAA 32* 34* 37*  ANIONGAP 12 10 15      Hematology Recent Labs  Lab 07/06/23 0919 07/07/23 0852 07/08/23 0401  WBC 3.4* 3.1* 3.2*  RBC 2.50* 2.33* 2.54*  HGB 8.4* 8.0* 8.4*  HCT 25.5* 24.2* 25.8*  MCV 102.0* 103.9* 101.6*  MCH 33.6 34.3* 33.1  MCHC 32.9 33.1 32.6  RDW 18.7* 18.9* 18.8*  PLT 25* 19* 24*    Cardiac EnzymesNo results for input(s): "TROPONINI" in the last 168 hours. No results for input(s): "TROPIPOC" in the last 168 hours.   BNP Recent Labs  Lab 07/04/23 0157 07/08/23 0401  BNP 531.6* 926.8*     DDimer No results for input(s): "DDIMER" in the last 168 hours.  Radiology    DG Chest Port 1 View  Result Date: 07/06/2023 CLINICAL DATA:  PICC line placement. EXAM: PORTABLE CHEST 1 VIEW COMPARISON:  07/03/2023. FINDINGS: Heart is enlarged and the mediastinal contour is within normal limits. There is atherosclerotic calcification of the aorta. The pulmonary vasculature is distended. Interstitial prominence is present bilaterally. A stable opacity is present at the left lung base. No pneumothorax. A right PICC line terminates over the superior vena cava. No acute osseous abnormality. IMPRESSION: 1. Right PICC line terminates over the superior vena cava. 2. Otherwise stable chest. Electronically Signed   By: Thornell Sartorius M.D.   On: 07/06/2023 19:56   DG Hand 2 View Left  Result Date: 07/06/2023 CLINICAL DATA:  Posterior hand pain EXAM: LEFT HAND - 2 VIEW COMPARISON:  None Available. FINDINGS: No fracture or malalignment. Joint space narrowing and arthritis at the IP joints. Dorsal soft tissue swelling without foreign body or emphysema IMPRESSION:  Dorsal soft tissue swelling. No acute osseous abnormality. Degenerative changes. Electronically Signed   By: Jasmine Pang M.D.   On: 07/06/2023 19:08   [place] EKG SITE RITE  Result Date: 07/06/2023 If Site Rite image not attached, placement could not be confirmed due to current cardiac rhythm.   Cardiac Studies   NA  Patient Profile     67 y.o. female with a hx of HFpEF, morbid obesity, OSA, NASH cirrhosis, esophageal varices, history of hepatic encephalopathy, s/p unsuccessful TIPS 06/30/2023, thrombocytopenia, type 2 diabetes, aortic stenosis, vasovagal syncope, and permanent atrial fibrillation, recurrent panniculitis, cardiology is following for acute CHF and A fib with RVR. S/P recent unsuccessful TIPS on 06/30/23, complicated by hemoperitoneum on CT post procedure. She came in initially with nausea, vomiting, hypotension, A fib RVR, in the setting of acute blood loss anemia and AKI.   Assessment & Plan    Atrial fib:  Baseline increased ventricular rate but controlled pretty well with transfusion, beta blocker, dig.  Off amio.  Not an anticoag candidate.    I will consolidate her beta blocker to twice daily.   I will increase her digoxin.  Give Lasix ant night when she will not be getting up to try to minimize BP effect.    Acute diastolic HF:  Continue IV diuresis as her creat tolerates.  Creat continues to trend down.  .    For questions or updates, please contact [facility] Please consult [email or web address] for contact info under Cardiology/STEMI.   Signed, Rollene Rotunda, MD  07/08/2023, 9:10 AM

## 2023-07-08 NOTE — Plan of Care (Signed)
  Problem: Fluid Volume: Goal: Hemodynamic stability will improve Outcome: Progressing   Problem: Clinical Measurements: Goal: Diagnostic test results will improve Outcome: Progressing Goal: Signs and symptoms of infection will decrease Outcome: Progressing   Problem: Respiratory: Goal: Ability to maintain adequate ventilation will improve Outcome: Progressing   Problem: Education: Goal: Understanding of CV disease, CV risk reduction, and recovery process will improve Outcome: Progressing Goal: Individualized Educational Video(s) Outcome: Progressing   Problem: Activity: Goal: Ability to return to baseline activity level will improve Outcome: Progressing   Problem: Cardiovascular: Goal: Ability to achieve and maintain adequate cardiovascular perfusion will improve Outcome: Progressing Goal: Vascular access site(s) Level 0-1 will be maintained Outcome: Progressing   Problem: Health Behavior/Discharge Planning: Goal: Ability to safely manage health-related needs after discharge will improve Outcome: Progressing   Problem: Education: Goal: Ability to describe self-care measures that may prevent or decrease complications (Diabetes Survival Skills Education) will improve Outcome: Progressing Goal: Individualized Educational Video(s) Outcome: Progressing   Problem: Coping: Goal: Ability to adjust to condition or change in health will improve Outcome: Progressing   Problem: Fluid Volume: Goal: Ability to maintain a balanced intake and output will improve Outcome: Progressing   Problem: Health Behavior/Discharge Planning: Goal: Ability to identify and utilize available resources and services will improve Outcome: Progressing Goal: Ability to manage health-related needs will improve Outcome: Progressing   Problem: Metabolic: Goal: Ability to maintain appropriate glucose levels will improve Outcome: Progressing   Problem: Nutritional: Goal: Maintenance of adequate  nutrition will improve Outcome: Progressing Goal: Progress toward achieving an optimal weight will improve Outcome: Progressing   Problem: Skin Integrity: Goal: Risk for impaired skin integrity will decrease Outcome: Progressing   Problem: Tissue Perfusion: Goal: Adequacy of tissue perfusion will improve Outcome: Progressing   Problem: Education: Goal: Knowledge of General Education information will improve Description: Including pain rating scale, medication(s)/side effects and non-pharmacologic comfort measures Outcome: Progressing   Problem: Health Behavior/Discharge Planning: Goal: Ability to manage health-related needs will improve Outcome: Progressing   Problem: Clinical Measurements: Goal: Ability to maintain clinical measurements within normal limits will improve Outcome: Progressing Goal: Will remain free from infection Outcome: Progressing Goal: Diagnostic test results will improve Outcome: Progressing Goal: Respiratory complications will improve Outcome: Progressing Goal: Cardiovascular complication will be avoided Outcome: Progressing   Problem: Activity: Goal: Risk for activity intolerance will decrease Outcome: Progressing   Problem: Nutrition: Goal: Adequate nutrition will be maintained Outcome: Progressing   Problem: Coping: Goal: Level of anxiety will decrease Outcome: Progressing   Problem: Elimination: Goal: Will not experience complications related to bowel motility Outcome: Progressing Goal: Will not experience complications related to urinary retention Outcome: Progressing   Problem: Pain Managment: Goal: General experience of comfort will improve Outcome: Progressing   Problem: Safety: Goal: Ability to remain free from injury will improve Outcome: Progressing   Problem: Skin Integrity: Goal: Risk for impaired skin integrity will decrease Outcome: Progressing

## 2023-07-08 NOTE — Progress Notes (Signed)
PROGRESS NOTE    Hannah Khan  ZOX:[id number] DOB: May 08, 1956 DOA: 07/03/2023 PCP: Darrow Bussing, MD    Chief Complaint  Patient presents with   Hypotension    Brief Narrative:   Hannah Khan is a 67 y.o. female with medical history significant of A-fib not on anticoagulation, CHF, hypertension, type 2 diabetes, CKD stage IIIb, Nash cirrhosis, gastroesophageal varices, chronic thrombocytopenia, anemia, abdominal wall cellulitis, severe morbid obesity. Recently admitted for TIPS creation on 06/30/2023 which was unsuccessful but they were able to perform balloon occlusion retrograde transvenous obliteration of the gastric varix. Postprocedure she developed hypotension requiring pressor support. Also developed A-fib with RVR treated with oral doses of metoprolol and Cardizem. Hospital course further complicated by acute blood loss anemia due to small to moderate volume hemoperitoneum. She was discharged from the hospital, patient presents with nausea, vomiting, hypotension, her workup significant for low hemoglobin at 6.3, as well thrombocytopenia (around baseline, she was noted to be in A-fib with RVR as well, hypotensive, so she was admitted for further workup.     Assessment & Plan:   Principal Problem:   Nausea and vomiting Active Problems:   Anemia   Cellulitis, abdominal wall   Chronic heart failure with preserved ejection fraction (HFpEF) (HCC)   Thrombocytopenia (HCC)   Type 2 diabetes mellitus (HCC)   Atrial fibrillation with RVR (HCC)   Liver cirrhosis secondary to NASH (HCC)   Chronic hypotension   Hypoxia   Nausea & vomiting   Acute blood loss anemia Hemoperitoneum -Baseline hemoglobin around 12, status post recent attempted TIPS procedures during recent hospitalizations, unsuccessful, with evidence of bleed/hemoperitoneum, hemoglobin down to 6.3 this admission. -Hemoglobin has been improving with transfusions, for required 4 units PRBC transfusion, target level  > 8, as main factor for her A-fib with RVR thought to be volume depletion, anemia.    Hypotension -Multifactorial, in the setting of A-fib with RVR, anemia, liver cirrhosis -Continue with midodrine. -Received IV albumin -Transfuse PRBC as needed, with target hemoglobin above 8.  Far required 4 units PRBC transfusion  Acute on chronic diastolic CHF -She is with evidence of volume overload, diuresis is limited in the setting of hypotension, continue with IV Lasix, continue with Aldactone, cardiology assistance greatly appreciated -metolazone remains on hold.  Decompensated NASH liver cirrhosis Coagulopathy Thrombocytopenia -In the setting of acute blood loss anemia, blood count is low at 19K today, will give another unit of platelet today (so far 3 units) -Will give vitamin K given coagulopathy -Continue with rifaximin -She failed TIPS procedure. -Continue with Lasix as tolerated -Continue with lactulose -Will request lower extremity Unna boots given significant edema  Permanent A-fib with RVR -This is in the setting of hypotension, acute blood loss anemia -Difficult to control, cardiology input greatly appreciated, will need to address underlying causes including her anemia, hypertension and volume status -Remains on amiodarone drip, continue as needed for now, continue with digoxin, blood pressure limits AV block oral agents titration -Not a candidate for anticoagulation due to thrombocytopenia, and acute blood loss anemia -Digoxin level is low at 0.4, injection dose has been increased, will monitor level intermittently -Metoprolol has been increased to 25 mg p.o. twice daily  Abdominal wall cellulitis/panniculitis -Initially on Rocephin>> Keflex, finish total 5 days.  Hypokalemia -Pleated, will increase Aldactone as well as she is on IV diuresis.   Hypoxia -Remains on 2 to 3 L nasal cannula. -Bilateral pleural effusion, vascular congestion, hopefully should improve once more  stable for diuresis -Encouraged use  incentive spirometry and flutter valve -VQ scan negative for PE   Elevated troponin Demand ischemia due to anemia, hypertension, non-ACS pattern, EKG nonacute.  AKI on CKD stage IIIb -Her baseline creatinine around 1.4-1.5, it is 2.03 this admission, she is high risk for hepatorenal syndrome, as well IV fluid is challenging in the setting of her volume status, so we will try to manage with IV albumin, and PRBC transfusion, continue with midodrine    Type 2 diabetes Glucose 205.  Last A1c 4.7 in February 2024, repeat ordered.  Sensitive sliding scale insulin every 4 hours for now until patient's p.o. intake improves.    DVT prophylaxis: SCD Code Status: Full Family Communication: Discussed with husband at bedside 10/18 Disposition:   Status is: Inpatient    Consultants:  cardiolgoy   Subjective:  No significant events overnight, does report some dyspnea today.  Objective: Vitals:   07/08/23 0754 07/08/23 0928 07/08/23 1019 07/08/23 1138  BP: (!) 100/46 90/70  (!) 112/58  Pulse: (!) 103 (!) 101 96 (!) 104  Resp: 20   16  Temp: (!) 97.2 F (36.2 C) 97.8 F (36.6 C) 97.8 F (36.6 C) 98.1 F (36.7 C)  TempSrc:    Oral  SpO2: 99%   93%  Weight:      Height:        Intake/Output Summary (Last 24 hours) at 07/08/2023 1227 Last data filed at 07/08/2023 0600 Gross per 24 hour  Intake 1301 ml  Output --  Net 1301 ml   Filed Weights   07/04/23 1827 07/06/23 0420 07/07/23 0318  Weight: (!) 151.2 kg (!) 154 kg (!) 156.9 kg    Examination:  Awake Alert, Oriented X 3, No new F.N deficits, Normal affect Symmetrical Chest wall movement, air entry at the bases, RRR,No Gallops,Rubs or new Murmurs, No Parasternal Heave +ve B.Sounds, Abd Soft, No tenderness, No rebound - guarding or rigidity. No Cyanosis, Clubbing, +3 edema     Data Reviewed: I have personally reviewed following labs and imaging studies  CBC: Recent Labs  Lab  07/04/23 0157 07/04/23 1100 07/05/23 0408 07/06/23 0919 07/07/23 0852 07/08/23 0401  WBC 4.3  --  4.2 3.4* 3.1* 3.2*  HGB 6.3* 7.5* 7.7* 8.4* 8.0* 8.4*  HCT 19.8* 22.5* 22.9* 25.5* 24.2* 25.8*  MCV 103.1*  --  101.3* 102.0* 103.9* 101.6*  PLT 24*  --  22* 25* 19* 24*    Basic Metabolic Panel: Recent Labs  Lab 07/04/23 0157 07/05/23 0408 07/06/23 0919 07/07/23 0852 07/08/23 0401  NA 135 135 135 136 139  K 3.8 3.6 3.4* 3.3* 3.4*  CL 103 101 102 101 100  CO2 22 22 21* 25 24  GLUCOSE 182* 167* 179* 209* 149*  BUN 34* 28* 26* 27* 28*  CREATININE 2.03* 1.87* 1.73* 1.66* 1.53*  CALCIUM 8.5* 8.7* 8.7* 8.8* 9.8    GFR: Estimated Creatinine Clearance: 56.2 mL/min (A) (by C-G formula based on SCr of 1.53 mg/dL (H)).  Liver Function Tests: Recent Labs  Lab 07/04/23 0157 07/05/23 0408 07/06/23 0919 07/07/23 0852 07/08/23 0401  AST 83* 56* 43* 34 31  ALT 52* 43 36 30 28  ALKPHOS 123 112 104 86 85  BILITOT 5.2* 4.8* 5.2* 5.7* 6.5*  PROT 5.5* 5.6* 6.0* 6.0* 5.8*  ALBUMIN 2.3* 2.6* 3.1* 3.4* 3.2*    CBG: Recent Labs  Lab 07/07/23 1952 07/08/23 0116 07/08/23 0315 07/08/23 0752 07/08/23 1135  GLUCAP 173* 141* 140* 189* 164*     Recent Results (  from the past 240 hour(s))  Surgical pcr screen     Status: None   Collection Time: 06/30/23  6:18 AM   Specimen: Nasal Mucosa; Nasal Swab  Result Value Ref Range Status   MRSA, PCR NEGATIVE NEGATIVE Final   Staphylococcus aureus NEGATIVE NEGATIVE Final    Comment: (NOTE) The Xpert SA Assay (FDA approved for NASAL specimens in patients 9 years of age and older), is one component of a comprehensive surveillance program. It is not intended to diagnose infection nor to guide or monitor treatment. Performed at [facility], [address] [place], [place] [address]   Culture, blood (Routine X 2) w Reflex to ID Panel     Status: None (Preliminary result)   Collection Time: 07/04/23  2:02 AM   Specimen: BLOOD RIGHT  HAND  Result Value Ref Range Status   Specimen Description BLOOD RIGHT HAND  Final   Special Requests   Final    BOTTLES DRAWN AEROBIC AND ANAEROBIC Blood Culture adequate volume   Culture   Final    NO GROWTH 4 DAYS Performed at [facility], [address] [place], [place] [address]    Report Status PENDING  Incomplete  Culture, blood (Routine X 2) w Reflex to ID Panel     Status: Abnormal   Collection Time: 07/04/23  2:02 AM   Specimen: BLOOD  Result Value Ref Range Status   Specimen Description BLOOD LEFT ANTECUBITAL  Final   Special Requests   Final    BOTTLES DRAWN AEROBIC AND ANAEROBIC Blood Culture adequate volume   Culture  Setup Time   Final    GRAM POSITIVE COCCI IN CLUSTERS AEROBIC BOTTLE ONLY CRITICAL RESULT CALLED TO, READ BACK BY AND VERIFIED WITH: PHARMD M. BITONTI 07/06/2023 @ 0724 BY AB    Culture (A)  Final    STAPHYLOCOCCUS EPIDERMIDIS THE SIGNIFICANCE OF ISOLATING THIS ORGANISM FROM A SINGLE SET OF BLOOD CULTURES WHEN MULTIPLE SETS ARE DRAWN IS UNCERTAIN. PLEASE NOTIFY THE MICROBIOLOGY DEPARTMENT WITHIN ONE WEEK IF SPECIATION AND SENSITIVITIES ARE REQUIRED. Performed at [facility], [address], [place], [place] [address]    Report Status 07/07/2023 FINAL  Final  Blood Culture ID Panel (Reflexed)     Status: Abnormal   Collection Time: 07/04/23  2:02 AM  Result Value Ref Range Status   Enterococcus faecalis NOT DETECTED NOT DETECTED Final   Enterococcus Faecium NOT DETECTED NOT DETECTED Final   Listeria monocytogenes NOT DETECTED NOT DETECTED Final   Staphylococcus species DETECTED (A) NOT DETECTED Final    Comment: CRITICAL RESULT CALLED TO, READ BACK BY AND VERIFIED WITH: PHARMD M. BITONTI 07/06/2023 @ 0724 BY AB    Staphylococcus aureus (BCID) NOT DETECTED NOT DETECTED Final   Staphylococcus epidermidis DETECTED (A) NOT DETECTED Final    Comment: Methicillin (oxacillin) resistant coagulase negative staphylococcus. Possible blood  culture contaminant (unless isolated from more than one blood culture draw or clinical case suggests pathogenicity). No antibiotic treatment is indicated for blood  culture contaminants. CRITICAL RESULT CALLED TO, READ BACK BY AND VERIFIED WITH: PHARMD M. BITONTI 07/06/2023 @ 0724 BY AB    Staphylococcus lugdunensis NOT DETECTED NOT DETECTED Final   Streptococcus species NOT DETECTED NOT DETECTED Final   Streptococcus agalactiae NOT DETECTED NOT DETECTED Final   Streptococcus pneumoniae NOT DETECTED NOT DETECTED Final   Streptococcus pyogenes NOT DETECTED NOT DETECTED Final   A.calcoaceticus-baumannii NOT DETECTED NOT DETECTED Final   Bacteroides fragilis NOT DETECTED NOT  DETECTED Final   Enterobacterales NOT DETECTED NOT DETECTED Final   Enterobacter cloacae complex NOT DETECTED NOT DETECTED Final   Escherichia coli NOT DETECTED NOT DETECTED Final   Klebsiella aerogenes NOT DETECTED NOT DETECTED Final   Klebsiella oxytoca NOT DETECTED NOT DETECTED Final   Klebsiella pneumoniae NOT DETECTED NOT DETECTED Final   Proteus species NOT DETECTED NOT DETECTED Final   Salmonella species NOT DETECTED NOT DETECTED Final   Serratia marcescens NOT DETECTED NOT DETECTED Final   Haemophilus influenzae NOT DETECTED NOT DETECTED Final   Neisseria meningitidis NOT DETECTED NOT DETECTED Final   Pseudomonas aeruginosa NOT DETECTED NOT DETECTED Final   Stenotrophomonas maltophilia NOT DETECTED NOT DETECTED Final   Candida albicans NOT DETECTED NOT DETECTED Final   Candida auris NOT DETECTED NOT DETECTED Final   Candida glabrata NOT DETECTED NOT DETECTED Final   Candida krusei NOT DETECTED NOT DETECTED Final   Candida parapsilosis NOT DETECTED NOT DETECTED Final   Candida tropicalis NOT DETECTED NOT DETECTED Final   Cryptococcus neoformans/gattii NOT DETECTED NOT DETECTED Final   Methicillin resistance mecA/C DETECTED (A) NOT DETECTED Final    Comment: CRITICAL RESULT CALLED TO, READ BACK BY AND VERIFIED  WITH: PHARMD M. BITONTI 07/06/2023 @ 0724 BY AB Performed at [facility], [address], [place], [place] [address]   MRSA Next Gen by PCR, Nasal     Status: None   Collection Time: 07/04/23  6:10 AM   Specimen: Nasal Mucosa; Nasal Swab  Result Value Ref Range Status   MRSA by PCR Next Gen NOT DETECTED NOT DETECTED Final    Comment: (NOTE) The GeneXpert MRSA Assay (FDA approved for NASAL specimens only), is one component of a comprehensive MRSA colonization surveillance program. It is not intended to diagnose MRSA infection nor to guide or monitor treatment for MRSA infections. Test performance is not FDA approved in patients less than 64 years old. Performed at [facility], [address] [place], [place] [address]          Radiology Studies: DG Chest Port 1 View  Result Date: 07/06/2023 CLINICAL DATA:  PICC line placement. EXAM: PORTABLE CHEST 1 VIEW COMPARISON:  07/03/2023. FINDINGS: Heart is enlarged and the mediastinal contour is within normal limits. There is atherosclerotic calcification of the aorta. The pulmonary vasculature is distended. Interstitial prominence is present bilaterally. A stable opacity is present at the left lung base. No pneumothorax. A right PICC line terminates over the superior vena cava. No acute osseous abnormality. IMPRESSION: 1. Right PICC line terminates over the superior vena cava. 2. Otherwise stable chest. Electronically Signed   By: Thornell Sartorius M.D.   On: 07/06/2023 19:56   DG Hand 2 View Left  Result Date: 07/06/2023 CLINICAL DATA:  Posterior hand pain EXAM: LEFT HAND - 2 VIEW COMPARISON:  None Available. FINDINGS: No fracture or malalignment. Joint space narrowing and arthritis at the IP joints. Dorsal soft tissue swelling without foreign body or emphysema IMPRESSION: Dorsal soft tissue swelling. No acute osseous abnormality. Degenerative changes. Electronically Signed   By: Jasmine Pang M.D.   On: 07/06/2023 19:08         Scheduled Meds:  Chlorhexidine Gluconate Cloth  6 each Topical Daily   [START ON 07/09/2023] digoxin  0.25 mg Oral Daily   insulin aspart  0-9 Units Subcutaneous Q4H   lactulose  20 g Oral TID   lidocaine  2 patch Transdermal Q24H   metoprolol tartrate  25 mg Oral Q12H  midodrine  20 mg Oral TID WC   potassium chloride  40 mEq Oral Q6H   rifaximin  550 mg Oral BID   sodium chloride flush  10-40 mL Intracatheter Q12H   spironolactone  100 mg Oral Daily   Continuous Infusions:  [START ON 07/09/2023] furosemide        LOS: 4 days       Huey Bienenstock, MD [facility]   To contact the attending provider between 7A-7P or the covering provider during after hours 7P-7A, please log into the web site [email or web address] and access using universal [place] password for that web site. If you do not have the password, please call the hospital operator.  07/08/2023, 12:27 PM

## 2023-07-08 NOTE — Progress Notes (Signed)
Orthopedic Tech Progress Note Patient Details:  Hannah Khan Jan 27, 1956 [id number]  Ortho Devices Type of Ortho Device: Radio broadcast assistant Ortho Device/Splint Location: BLE Ortho Device/Splint Interventions: Ordered, Application, Adjustment   Post Interventions Patient Tolerated: Well Instructions Provided: Care of device  [name] 07/08/2023, 4:06 PM

## 2023-07-09 DIAGNOSIS — I4891 Unspecified atrial fibrillation: Secondary | ICD-10-CM | POA: Diagnosis not present

## 2023-07-09 DIAGNOSIS — I5031 Acute diastolic (congestive) heart failure: Secondary | ICD-10-CM | POA: Diagnosis not present

## 2023-07-09 DIAGNOSIS — D649 Anemia, unspecified: Secondary | ICD-10-CM | POA: Diagnosis not present

## 2023-07-09 LAB — CULTURE, BLOOD (ROUTINE X 2)
Culture: NO GROWTH
Special Requests: ADEQUATE

## 2023-07-09 LAB — COMPREHENSIVE METABOLIC PANEL
ALT: 26 U/L (ref 0–44)
AST: 30 U/L (ref 15–41)
Albumin: 3.2 g/dL — ABNORMAL LOW (ref 3.5–5.0)
Alkaline Phosphatase: 87 U/L (ref 38–126)
Anion gap: 8 (ref 5–15)
BUN: 26 mg/dL — ABNORMAL HIGH (ref 8–23)
CO2: 26 mmol/L (ref 22–32)
Calcium: 8.9 mg/dL (ref 8.9–10.3)
Chloride: 105 mmol/L (ref 98–111)
Creatinine, Ser: 1.3 mg/dL — ABNORMAL HIGH (ref 0.44–1.00)
GFR, Estimated: 45 mL/min — ABNORMAL LOW (ref >60)
Glucose, Bld: 149 mg/dL — ABNORMAL HIGH (ref 70–99)
Potassium: 3.8 mmol/L (ref 3.5–5.1)
Sodium: 139 mmol/L (ref 135–145)
Total Bilirubin: 5.1 mg/dL — ABNORMAL HIGH (ref 0.3–1.2)
Total Protein: 5.9 g/dL — ABNORMAL LOW (ref 6.5–8.1)

## 2023-07-09 LAB — GLUCOSE, CAPILLARY
Glucose-Capillary: 114 mg/dL — ABNORMAL HIGH (ref 70–99)
Glucose-Capillary: 126 mg/dL — ABNORMAL HIGH (ref 70–99)
Glucose-Capillary: 148 mg/dL — ABNORMAL HIGH (ref 70–99)
Glucose-Capillary: 150 mg/dL — ABNORMAL HIGH (ref 70–99)
Glucose-Capillary: 165 mg/dL — ABNORMAL HIGH (ref 70–99)
Glucose-Capillary: 177 mg/dL — ABNORMAL HIGH (ref 70–99)
Glucose-Capillary: 199 mg/dL — ABNORMAL HIGH (ref 70–99)

## 2023-07-09 LAB — CBC
HCT: 26.8 % — ABNORMAL LOW (ref 36.0–46.0)
Hemoglobin: 8.6 g/dL — ABNORMAL LOW (ref 12.0–15.0)
MCH: 32.8 pg (ref 26.0–34.0)
MCHC: 32.1 g/dL (ref 30.0–36.0)
MCV: 102.3 fL — ABNORMAL HIGH (ref 80.0–100.0)
Platelets: 28 10*3/uL — CL (ref 150–400)
RBC: 2.62 MIL/uL — ABNORMAL LOW (ref 3.87–5.11)
RDW: 18.5 % — ABNORMAL HIGH (ref 11.5–15.5)
WBC: 3.7 10*3/uL — ABNORMAL LOW (ref 4.0–10.5)
nRBC: 0 % (ref 0.0–0.2)

## 2023-07-09 LAB — BRAIN NATRIURETIC PEPTIDE: B Natriuretic Peptide: 594.3 pg/mL — ABNORMAL HIGH (ref 0.0–100.0)

## 2023-07-09 MED ORDER — POTASSIUM CHLORIDE CRYS ER 20 MEQ PO TBCR
30.0000 meq | EXTENDED_RELEASE_TABLET | Freq: Four times a day (QID) | ORAL | Status: AC
  Administered 2023-07-09 (×2): 30 meq via ORAL
  Filled 2023-07-09 (×2): qty 1

## 2023-07-09 NOTE — Progress Notes (Signed)
PROGRESS NOTE    Hannah Khan  UEA:[id number] DOB: Jan 29, 1956 DOA: 07/03/2023 PCP: Darrow Bussing, MD    Chief Complaint  Patient presents with   Hypotension    Brief Narrative:   Hannah Khan is a 67 y.o. female with medical history significant of A-fib not on anticoagulation, CHF, hypertension, type 2 diabetes, CKD stage IIIb, Nash cirrhosis, gastroesophageal varices, chronic thrombocytopenia, anemia, abdominal wall cellulitis, severe morbid obesity. Recently admitted for TIPS creation on 06/30/2023 which was unsuccessful but they were able to perform balloon occlusion retrograde transvenous obliteration of the gastric varix. Postprocedure she developed hypotension requiring pressor support. Also developed A-fib with RVR treated with oral doses of metoprolol and Cardizem. Hospital course further complicated by acute blood loss anemia due to small to moderate volume hemoperitoneum. She was discharged from the hospital, patient presents with nausea, vomiting, hypotension, her workup significant for low hemoglobin at 6.3, as well thrombocytopenia (around baseline, she was noted to be in A-fib with RVR as well, hypotensive, so she was admitted for further workup.     Assessment & Plan:   Principal Problem:   Nausea and vomiting Active Problems:   Anemia   Cellulitis, abdominal wall   Chronic heart failure with preserved ejection fraction (HFpEF) (HCC)   Thrombocytopenia (HCC)   Type 2 diabetes mellitus (HCC)   Atrial fibrillation with RVR (HCC)   Liver cirrhosis secondary to NASH (HCC)   Chronic hypotension   Hypoxia   Nausea & vomiting   Acute blood loss anemia Hemoperitoneum -Baseline hemoglobin around 12, status post recent attempted TIPS procedures during recent hospitalizations, unsuccessful, with evidence of bleed/hemoperitoneum, hemoglobin down to 6.3 this admission. -Hemoglobin has been improving with transfusions, for required 4 units PRBC transfusion, target level  > 8, as main factor for her A-fib with RVR thought to be volume depletion, anemia.    Hypotension -Multifactorial, in the setting of A-fib with RVR, anemia, liver cirrhosis -Continue with midodrine. -Received IV albumin -Transfuse as needed to keep hemoglobin > 8  -Continue to monitor blood pressure, as increasing her beta-blockers and IV diuresis   Acute on chronic diastolic CHF -She is with evidence of volume overload, diuresis is limited in the setting of hypotension, continue with IV Lasix, cardiology assistance greatly appreciated, with significant IV diuresis at nighttime to avoid low blood pressure daytime.  . -metolazone remains on hold.  Decompensated NASH liver cirrhosis Coagulopathy Thrombocytopenia -In the setting of acute blood loss anemia, blood count is low at 19K today, will give another unit of platelet today (so far 3 units) -Will give vitamin K given coagulopathy -Continue with rifaximin -She failed TIPS procedure. -Continue with Lasix as tolerated -Continue with lactulose -Continue with Unna boots bilaterally to help mobilize fluids  Permanent A-fib with RVR -This is in the setting of hypotension, acute blood loss anemia -Difficult to control, cardiology input greatly appreciated, will need to address underlying causes including her anemia, hypertension and volume status -Amiodarone drip initially, has been discontinued currently  -Not a candidate for anticoagulation due to thrombocytopenia, and acute blood loss anemia -Oxygen level has been increased given level of 0.4, will monitor level intermittently.   -Metoprolol has been increased to 25 mg p.o. twice daily  Abdominal wall cellulitis/panniculitis -Initially on Rocephin>> Keflex, finish total 5 days.  Hypokalemia -Pleated, will increase Aldactone as well as she is on IV diuresis.   Hypoxia -Remains on 2 to 3 L nasal cannula. -Bilateral pleural effusion, vascular congestion, hopefully should improve  once more  stable for diuresis -Encouraged use incentive spirometry and flutter valve -VQ scan negative for PE   Elevated troponin Demand ischemia due to anemia, hypertension, non-ACS pattern, EKG nonacute.  AKI on CKD stage IIIb -Her baseline creatinine around 1.4-1.5, it is 2.03 this admission, she is high risk for hepatorenal syndrome, as well IV fluid is challenging in the setting of her volume status, so we will try to manage with IV albumin, and PRBC transfusion, continue with midodrine    Type 2 diabetes Glucose 205.  Last A1c 4.7 in February 2024, repeat ordered.  Sensitive sliding scale insulin every 4 hours for now until patient's p.o. intake improves.    DVT prophylaxis: SCD Code Status: Full Family Communication: Discussed with husband at bedside 10/18 Disposition:   Status is: Inpatient    Consultants:  cardiolgoy   Subjective:  No significant events overnight, reports dyspnea has improved  Objective: Vitals:   07/09/23 0014 07/09/23 0313 07/09/23 0800 07/09/23 0831  BP: (!) 143/59 (!) 125/51 (!) 113/46   Pulse: 98 (!) 102 (!) 102 (!) 101  Resp: 16 20 17    Temp: 97.8 F (36.6 C)  97.6 F (36.4 C)   TempSrc: Oral Oral    SpO2: 94% 93% 100%   Weight:      Height:        Intake/Output Summary (Last 24 hours) at 07/09/2023 1145 Last data filed at 07/09/2023 1057 Gross per 24 hour  Intake 720 ml  Output 1000 ml  Net -280 ml   Filed Weights   07/04/23 1827 07/06/23 0420 07/07/23 0318  Weight: (!) 151.2 kg (!) 154 kg (!) 156.9 kg    Examination:  Awake Alert, Oriented X 3, No new F.N deficits, Normal affect Symmetrical Chest wall movement, Good air movement bilaterally, CTAB RRR,No Gallops,Rubs or new Murmurs, No Parasternal Heave +ve B.Sounds, Abd Soft, No tenderness, No rebound - guarding or rigidity.   No Cyanosis, Clubbing, +3 edema     Data Reviewed: I have personally reviewed following labs and imaging studies  CBC: Recent Labs  Lab  07/05/23 0408 07/06/23 0919 07/07/23 0852 07/08/23 0401 07/09/23 0312  WBC 4.2 3.4* 3.1* 3.2* 3.7*  HGB 7.7* 8.4* 8.0* 8.4* 8.6*  HCT 22.9* 25.5* 24.2* 25.8* 26.8*  MCV 101.3* 102.0* 103.9* 101.6* 102.3*  PLT 22* 25* 19* 24* 28*    Basic Metabolic Panel: Recent Labs  Lab 07/05/23 0408 07/06/23 0919 07/07/23 0852 07/08/23 0401 07/09/23 0312  NA 135 135 136 139 139  K 3.6 3.4* 3.3* 3.4* 3.8  CL 101 102 101 100 105  CO2 22 21* 25 24 26   GLUCOSE 167* 179* 209* 149* 149*  BUN 28* 26* 27* 28* 26*  CREATININE 1.87* 1.73* 1.66* 1.53* 1.30*  CALCIUM 8.7* 8.7* 8.8* 9.8 8.9    GFR: Estimated Creatinine Clearance: 66.1 mL/min (A) (by C-G formula based on SCr of 1.3 mg/dL (H)).  Liver Function Tests: Recent Labs  Lab 07/05/23 0408 07/06/23 0919 07/07/23 0852 07/08/23 0401 07/09/23 0312  AST 56* 43* 34 31 30  ALT 43 36 30 28 26   ALKPHOS 112 104 86 85 87  BILITOT 4.8* 5.2* 5.7* 6.5* 5.1*  PROT 5.6* 6.0* 6.0* 5.8* 5.9*  ALBUMIN 2.6* 3.1* 3.4* 3.2* 3.2*    CBG: Recent Labs  Lab 07/08/23 2051 07/09/23 0012 07/09/23 0304 07/09/23 0757 07/09/23 1122  GLUCAP 182* 165* 148* 177* 150*     Recent Results (from the past 240 hour(s))  Surgical pcr screen  Status: None   Collection Time: 06/30/23  6:18 AM   Specimen: Nasal Mucosa; Nasal Swab  Result Value Ref Range Status   MRSA, PCR NEGATIVE NEGATIVE Final   Staphylococcus aureus NEGATIVE NEGATIVE Final    Comment: (NOTE) The Xpert SA Assay (FDA approved for NASAL specimens in patients 36 years of age and older), is one component of a comprehensive surveillance program. It is not intended to diagnose infection nor to guide or monitor treatment. Performed at [facility], [address] [place], [place] [address]   Culture, blood (Routine X 2) w Reflex to ID Panel     Status: None   Collection Time: 07/04/23  2:02 AM   Specimen: BLOOD RIGHT HAND  Result Value Ref Range Status   Specimen Description  BLOOD RIGHT HAND  Final   Special Requests   Final    BOTTLES DRAWN AEROBIC AND ANAEROBIC Blood Culture adequate volume   Culture   Final    NO GROWTH 5 DAYS Performed at [facility], [address] [place], [place] [address]    Report Status 07/09/2023 FINAL  Final  Culture, blood (Routine X 2) w Reflex to ID Panel     Status: Abnormal   Collection Time: 07/04/23  2:02 AM   Specimen: BLOOD  Result Value Ref Range Status   Specimen Description BLOOD LEFT ANTECUBITAL  Final   Special Requests   Final    BOTTLES DRAWN AEROBIC AND ANAEROBIC Blood Culture adequate volume   Culture  Setup Time   Final    GRAM POSITIVE COCCI IN CLUSTERS AEROBIC BOTTLE ONLY CRITICAL RESULT CALLED TO, READ BACK BY AND VERIFIED WITH: PHARMD M. BITONTI 07/06/2023 @ 0724 BY AB    Culture (A)  Final    STAPHYLOCOCCUS EPIDERMIDIS THE SIGNIFICANCE OF ISOLATING THIS ORGANISM FROM A SINGLE SET OF BLOOD CULTURES WHEN MULTIPLE SETS ARE DRAWN IS UNCERTAIN. PLEASE NOTIFY THE MICROBIOLOGY DEPARTMENT WITHIN ONE WEEK IF SPECIATION AND SENSITIVITIES ARE REQUIRED. Performed at [facility], [address], [place], [place] [address]    Report Status 07/07/2023 FINAL  Final  Blood Culture ID Panel (Reflexed)     Status: Abnormal   Collection Time: 07/04/23  2:02 AM  Result Value Ref Range Status   Enterococcus faecalis NOT DETECTED NOT DETECTED Final   Enterococcus Faecium NOT DETECTED NOT DETECTED Final   Listeria monocytogenes NOT DETECTED NOT DETECTED Final   Staphylococcus species DETECTED (A) NOT DETECTED Final    Comment: CRITICAL RESULT CALLED TO, READ BACK BY AND VERIFIED WITH: PHARMD M. BITONTI 07/06/2023 @ 0724 BY AB    Staphylococcus aureus (BCID) NOT DETECTED NOT DETECTED Final   Staphylococcus epidermidis DETECTED (A) NOT DETECTED Final    Comment: Methicillin (oxacillin) resistant coagulase negative staphylococcus. Possible blood culture contaminant (unless isolated from more than one blood  culture draw or clinical case suggests pathogenicity). No antibiotic treatment is indicated for blood  culture contaminants. CRITICAL RESULT CALLED TO, READ BACK BY AND VERIFIED WITH: PHARMD M. BITONTI 07/06/2023 @ 0724 BY AB    Staphylococcus lugdunensis NOT DETECTED NOT DETECTED Final   Streptococcus species NOT DETECTED NOT DETECTED Final   Streptococcus agalactiae NOT DETECTED NOT DETECTED Final   Streptococcus pneumoniae NOT DETECTED NOT DETECTED Final   Streptococcus pyogenes NOT DETECTED NOT DETECTED Final   A.calcoaceticus-baumannii NOT DETECTED NOT DETECTED Final   Bacteroides fragilis NOT DETECTED NOT DETECTED Final   Enterobacterales NOT DETECTED NOT DETECTED Final   Enterobacter cloacae  complex NOT DETECTED NOT DETECTED Final   Escherichia coli NOT DETECTED NOT DETECTED Final   Klebsiella aerogenes NOT DETECTED NOT DETECTED Final   Klebsiella oxytoca NOT DETECTED NOT DETECTED Final   Klebsiella pneumoniae NOT DETECTED NOT DETECTED Final   Proteus species NOT DETECTED NOT DETECTED Final   Salmonella species NOT DETECTED NOT DETECTED Final   Serratia marcescens NOT DETECTED NOT DETECTED Final   Haemophilus influenzae NOT DETECTED NOT DETECTED Final   Neisseria meningitidis NOT DETECTED NOT DETECTED Final   Pseudomonas aeruginosa NOT DETECTED NOT DETECTED Final   Stenotrophomonas maltophilia NOT DETECTED NOT DETECTED Final   Candida albicans NOT DETECTED NOT DETECTED Final   Candida auris NOT DETECTED NOT DETECTED Final   Candida glabrata NOT DETECTED NOT DETECTED Final   Candida krusei NOT DETECTED NOT DETECTED Final   Candida parapsilosis NOT DETECTED NOT DETECTED Final   Candida tropicalis NOT DETECTED NOT DETECTED Final   Cryptococcus neoformans/gattii NOT DETECTED NOT DETECTED Final   Methicillin resistance mecA/C DETECTED (A) NOT DETECTED Final    Comment: CRITICAL RESULT CALLED TO, READ BACK BY AND VERIFIED WITH: PHARMD M. BITONTI 07/06/2023 @ 0724 BY AB Performed  at [facility], [address], [place], [place] [address]   MRSA Next Gen by PCR, Nasal     Status: None   Collection Time: 07/04/23  6:10 AM   Specimen: Nasal Mucosa; Nasal Swab  Result Value Ref Range Status   MRSA by PCR Next Gen NOT DETECTED NOT DETECTED Final    Comment: (NOTE) The GeneXpert MRSA Assay (FDA approved for NASAL specimens only), is one component of a comprehensive MRSA colonization surveillance program. It is not intended to diagnose MRSA infection nor to guide or monitor treatment for MRSA infections. Test performance is not FDA approved in patients less than 3 years old. Performed at [facility], [address] [place], [place] [address]          Radiology Studies: DG Chest Port 1 View  Result Date: 07/08/2023 CLINICAL DATA:  Hypoxia EXAM: PORTABLE CHEST 1 VIEW COMPARISON:  07/06/2023 FINDINGS: Right PICC line remains in place. Stable cardiomegaly. Progressive bilateral airspace opacities including a more confluent right upper lobe opacity. Probable small bilateral pleural effusions. IMPRESSION: 1. Progressive bilateral airspace opacities including a more confluent right upper lobe opacity. Findings may reflect pulmonary edema or multifocal pneumonia. 2. Probable small bilateral pleural effusions. Electronically Signed   By: Duanne Guess D.O.   On: 07/08/2023 16:38        Scheduled Meds:  Chlorhexidine Gluconate Cloth  6 each Topical Daily   digoxin  0.25 mg Oral Daily   insulin aspart  0-9 Units Subcutaneous Q4H   lactulose  20 g Oral TID   lidocaine  2 patch Transdermal Q24H   metoprolol tartrate  25 mg Oral Q12H   midodrine  20 mg Oral TID WC   potassium chloride  30 mEq Oral Q6H   rifaximin  550 mg Oral BID   sodium chloride flush  10-40 mL Intracatheter Q12H   spironolactone  100 mg Oral Daily   Continuous Infusions:  furosemide 120 mg (07/08/23 2305)      LOS: 5 days       Huey Bienenstock, MD [facility]   To contact the attending provider between 7A-7P or the covering provider during after hours 7P-7A, please log into the web site [email or web address] and access using universal [facility] password for that web site. If you  do not have the password, please call the hospital operator.  07/09/2023, 11:45 AM

## 2023-07-09 NOTE — Plan of Care (Signed)
Pt has rested quietly throughout the night with no distress noted. Alert and oriented. On O2 3LNC and CPAP while sleeping. A-fib on the monitor. Up to [facility] to void/BM. Used purwick during sleep since she got IV lasix late. Was incontinent once when pt got cramp in back so she had 800 out of container and large wet in depends. Medicated once for pain with relief noted. Pt up in chair for breakfast. No other complaints voiced.     Problem: Fluid Volume: Goal: Hemodynamic stability will improve 07/09/2023 0631 by Antony Madura, RN Outcome: Progressing 07/09/2023 0534 by Antony Madura, RN Outcome: Progressing   Problem: Clinical Measurements: Goal: Diagnostic test results will improve 07/09/2023 0631 by Antony Madura, RN Outcome: Progressing 07/09/2023 0534 by Antony Madura, RN Outcome: Progressing Goal: Signs and symptoms of infection will decrease 07/09/2023 0631 by Antony Madura, RN Outcome: Progressing 07/09/2023 0534 by Antony Madura, RN Outcome: Progressing   Problem: Respiratory: Goal: Ability to maintain adequate ventilation will improve 07/09/2023 0631 by Antony Madura, RN Outcome: Progressing 07/09/2023 0534 by Antony Madura, RN Outcome: Progressing   Problem: Education: Goal: Understanding of CV disease, CV risk reduction, and recovery process will improve 07/09/2023 0631 by Antony Madura, RN Outcome: Progressing 07/09/2023 0534 by Antony Madura, RN Outcome: Progressing Goal: Individualized Educational Video(s) 07/09/2023 0631 by Antony Madura, RN Outcome: Progressing 07/09/2023 0534 by Antony Madura, RN Outcome: Progressing   Problem: Activity: Goal: Ability to return to baseline activity level will improve 07/09/2023 0631 by Antony Madura, RN Outcome: Progressing 07/09/2023 0534 by Antony Madura, RN Outcome: Progressing   Problem: Cardiovascular: Goal: Ability to achieve and maintain adequate cardiovascular perfusion will improve 07/09/2023 0631 by Antony Madura,  RN Outcome: Progressing 07/09/2023 0534 by Antony Madura, RN Outcome: Progressing Goal: Vascular access site(s) Level 0-1 will be maintained 07/09/2023 0631 by Antony Madura, RN Outcome: Progressing 07/09/2023 0534 by Antony Madura, RN Outcome: Progressing   Problem: Health Behavior/Discharge Planning: Goal: Ability to safely manage health-related needs after discharge will improve 07/09/2023 0631 by Antony Madura, RN Outcome: Progressing 07/09/2023 0534 by Antony Madura, RN Outcome: Progressing   Problem: Education: Goal: Ability to describe self-care measures that may prevent or decrease complications (Diabetes Survival Skills Education) will improve 07/09/2023 0631 by Antony Madura, RN Outcome: Progressing 07/09/2023 0534 by Antony Madura, RN Outcome: Progressing Goal: Individualized Educational Video(s) 07/09/2023 0631 by Antony Madura, RN Outcome: Progressing 07/09/2023 0534 by Antony Madura, RN Outcome: Progressing   Problem: Coping: Goal: Ability to adjust to condition or change in health will improve 07/09/2023 0631 by Antony Madura, RN Outcome: Progressing 07/09/2023 0534 by Antony Madura, RN Outcome: Progressing   Problem: Fluid Volume: Goal: Ability to maintain a balanced intake and output will improve 07/09/2023 0631 by Antony Madura, RN Outcome: Progressing 07/09/2023 0534 by Antony Madura, RN Outcome: Progressing   Problem: Health Behavior/Discharge Planning: Goal: Ability to identify and utilize available resources and services will improve 07/09/2023 0631 by Antony Madura, RN Outcome: Progressing 07/09/2023 0534 by Antony Madura, RN Outcome: Progressing Goal: Ability to manage health-related needs will improve 07/09/2023 0631 by Antony Madura, RN Outcome: Progressing 07/09/2023 0534 by Antony Madura, RN Outcome: Progressing   Problem: Metabolic: Goal: Ability to maintain appropriate glucose levels will improve 07/09/2023 0631 by Antony Madura, RN Outcome:  Progressing 07/09/2023 0534 by Antony Madura, RN Outcome: Progressing   Problem: Nutritional: Goal: Maintenance of adequate nutrition will improve 07/09/2023 0631 by Antony Madura, RN Outcome: Progressing 07/09/2023 0534 by Antony Madura, RN Outcome: Progressing Goal: Progress toward achieving an  optimal weight will improve 07/09/2023 0631 by Antony Madura, RN Outcome: Progressing 07/09/2023 0534 by Antony Madura, RN Outcome: Progressing   Problem: Skin Integrity: Goal: Risk for impaired skin integrity will decrease 07/09/2023 0631 by Antony Madura, RN Outcome: Progressing 07/09/2023 0534 by Antony Madura, RN Outcome: Progressing   Problem: Tissue Perfusion: Goal: Adequacy of tissue perfusion will improve 07/09/2023 0631 by Antony Madura, RN Outcome: Progressing 07/09/2023 0534 by Antony Madura, RN Outcome: Progressing   Problem: Education: Goal: Knowledge of General Education information will improve Description: Including pain rating scale, medication(s)/side effects and non-pharmacologic comfort measures 07/09/2023 0631 by Antony Madura, RN Outcome: Progressing 07/09/2023 0534 by Antony Madura, RN Outcome: Progressing   Problem: Health Behavior/Discharge Planning: Goal: Ability to manage health-related needs will improve 07/09/2023 0631 by Antony Madura, RN Outcome: Progressing 07/09/2023 0534 by Antony Madura, RN Outcome: Progressing   Problem: Clinical Measurements: Goal: Ability to maintain clinical measurements within normal limits will improve 07/09/2023 0631 by Antony Madura, RN Outcome: Progressing 07/09/2023 0534 by Antony Madura, RN Outcome: Progressing Goal: Will remain free from infection 07/09/2023 0631 by Antony Madura, RN Outcome: Progressing 07/09/2023 0534 by Antony Madura, RN Outcome: Progressing Goal: Diagnostic test results will improve 07/09/2023 0631 by Antony Madura, RN Outcome: Progressing 07/09/2023 0534 by Antony Madura, RN Outcome:  Progressing Goal: Respiratory complications will improve 07/09/2023 0631 by Antony Madura, RN Outcome: Progressing 07/09/2023 0534 by Antony Madura, RN Outcome: Progressing Goal: Cardiovascular complication will be avoided 07/09/2023 0631 by Antony Madura, RN Outcome: Progressing 07/09/2023 0534 by Antony Madura, RN Outcome: Progressing   Problem: Activity: Goal: Risk for activity intolerance will decrease 07/09/2023 0631 by Antony Madura, RN Outcome: Progressing 07/09/2023 0534 by Antony Madura, RN Outcome: Progressing   Problem: Nutrition: Goal: Adequate nutrition will be maintained 07/09/2023 0631 by Antony Madura, RN Outcome: Progressing 07/09/2023 0534 by Antony Madura, RN Outcome: Progressing   Problem: Coping: Goal: Level of anxiety will decrease 07/09/2023 0631 by Antony Madura, RN Outcome: Progressing 07/09/2023 0534 by Antony Madura, RN Outcome: Progressing   Problem: Elimination: Goal: Will not experience complications related to bowel motility 07/09/2023 0631 by Antony Madura, RN Outcome: Progressing 07/09/2023 0534 by Antony Madura, RN Outcome: Progressing Goal: Will not experience complications related to urinary retention 07/09/2023 0631 by Antony Madura, RN Outcome: Progressing 07/09/2023 0534 by Antony Madura, RN Outcome: Progressing   Problem: Pain Managment: Goal: General experience of comfort will improve 07/09/2023 0631 by Antony Madura, RN Outcome: Progressing 07/09/2023 0534 by Antony Madura, RN Outcome: Progressing   Problem: Safety: Goal: Ability to remain free from injury will improve 07/09/2023 0631 by Antony Madura, RN Outcome: Progressing 07/09/2023 0534 by Antony Madura, RN Outcome: Progressing   Problem: Skin Integrity: Goal: Risk for impaired skin integrity will decrease 07/09/2023 0631 by Antony Madura, RN Outcome: Progressing 07/09/2023 0534 by Antony Madura, RN Outcome: Progressing

## 2023-07-09 NOTE — Progress Notes (Addendum)
Progress Note  Patient Name: Hannah Khan Date of Encounter: 07/09/2023  Primary Cardiologist:   Chilton Si, MD   Subjective   She had some sciatic pain yesterday but is doing well today.  Up in the chair. Breathing is somewhat better  Inpatient Medications    Scheduled Meds:  Chlorhexidine Gluconate Cloth  6 each Topical Daily   digoxin  0.25 mg Oral Daily   insulin aspart  0-9 Units Subcutaneous Q4H   lactulose  20 g Oral TID   lidocaine  2 patch Transdermal Q24H   metoprolol tartrate  25 mg Oral Q12H   midodrine  20 mg Oral TID WC   potassium chloride  30 mEq Oral Q6H   rifaximin  550 mg Oral BID   sodium chloride flush  10-40 mL Intracatheter Q12H   spironolactone  100 mg Oral Daily   Continuous Infusions:  furosemide 120 mg (07/08/23 2305)   PRN Meds: naLOXone (NARCAN)  injection, ondansetron **OR** ondansetron (ZOFRAN) IV, mouth rinse, oxyCODONE, sodium chloride flush   Vital Signs    Vitals:   07/09/23 0014 07/09/23 0313 07/09/23 0800 07/09/23 0831  BP: (!) 143/59 (!) 125/51 (!) 113/46   Pulse: 98 (!) 102 (!) 102 (!) 101  Resp: 16 20 17    Temp: 97.8 F (36.6 C)  97.6 F (36.4 C)   TempSrc: Oral Oral    SpO2: 94% 93% 100%   Weight:      Height:        Intake/Output Summary (Last 24 hours) at 07/09/2023 1025 Last data filed at 07/09/2023 0600 Gross per 24 hour  Intake 720 ml  Output 800 ml  Net -80 ml   Filed Weights   07/04/23 1827 07/06/23 0420 07/07/23 0318  Weight: (!) 151.2 kg (!) 154 kg (!) 156.9 kg    Telemetry    Atrial fib with relatively controlled rate - Personally Reviewed  ECG    NA - Personally Reviewed  Physical Exam   GEN: No acute distress.   Neck: No  JVD Cardiac: Irregular RR, 3/6 murmurs, rubs, or gallops.  Respiratory: Clear ,  no diasotlic to auscultation bilaterally. GI: Soft, nontender, non-distended  MS: No  edema; No deformity. Neuro:  Nonfocal  Psych: Normal affect   Labs     Chemistry Recent Labs  Lab 07/07/23 0852 07/08/23 0401 07/09/23 0312  NA 136 139 139  K 3.3* 3.4* 3.8  CL 101 100 105  CO2 25 24 26   GLUCOSE 209* 149* 149*  BUN 27* 28* 26*  CREATININE 1.66* 1.53* 1.30*  CALCIUM 8.8* 9.8 8.9  PROT 6.0* 5.8* 5.9*  ALBUMIN 3.4* 3.2* 3.2*  AST 34 31 30  ALT 30 28 26   ALKPHOS 86 85 87  BILITOT 5.7* 6.5* 5.1*  GFRNONAA 34* 37* 45*  ANIONGAP 10 15 8      Hematology Recent Labs  Lab 07/07/23 0852 07/08/23 0401 07/09/23 0312  WBC 3.1* 3.2* 3.7*  RBC 2.33* 2.54* 2.62*  HGB 8.0* 8.4* 8.6*  HCT 24.2* 25.8* 26.8*  MCV 103.9* 101.6* 102.3*  MCH 34.3* 33.1 32.8  MCHC 33.1 32.6 32.1  RDW 18.9* 18.8* 18.5*  PLT 19* 24* 28*    Cardiac EnzymesNo results for input(s): "TROPONINI" in the last 168 hours. No results for input(s): "TROPIPOC" in the last 168 hours.   BNP Recent Labs  Lab 07/04/23 0157 07/08/23 0401 07/09/23 0312  BNP 531.6* 926.8* 594.3*     DDimer No results for input(s): "DDIMER" in  the last 168 hours.   Radiology    DG Chest Port 1 View  Result Date: 07/08/2023 CLINICAL DATA:  Hypoxia EXAM: PORTABLE CHEST 1 VIEW COMPARISON:  07/06/2023 FINDINGS: Right PICC line remains in place. Stable cardiomegaly. Progressive bilateral airspace opacities including a more confluent right upper lobe opacity. Probable small bilateral pleural effusions. IMPRESSION: 1. Progressive bilateral airspace opacities including a more confluent right upper lobe opacity. Findings may reflect pulmonary edema or multifocal pneumonia. 2. Probable small bilateral pleural effusions. Electronically Signed   By: Duanne Guess D.O.   On: 07/08/2023 16:38    Cardiac Studies   NA  Patient Profile     67 y.o. female with a hx of HFpEF, morbid obesity, OSA, NASH cirrhosis, esophageal varices, history of hepatic encephalopathy, s/p unsuccessful TIPS 06/30/2023, thrombocytopenia, type 2 diabetes, aortic stenosis, vasovagal syncope, and permanent atrial  fibrillation, recurrent panniculitis, cardiology is following for acute CHF and A fib with RVR. S/P recent unsuccessful TIPS on 06/30/23, complicated by hemoperitoneum on CT post procedure. She came in initially with nausea, vomiting, hypotension, A fib RVR, in the setting of acute blood loss anemia and AKI.   Assessment & Plan    Atrial fib:   Rates OK.  Tolerating current dose of beta blocker and dig increased. We can follow a dig level in the future.     Acute diastolic HF:    Intake and output are not accurate unfortunately.   Creat seems to be tolerating diuresis.    Continue current IV diuresis.       For questions or updates, please contact [facility] Please consult [email or web address] for contact info under Cardiology/STEMI.   Signed, Rollene Rotunda, MD  07/09/2023, 10:25 AM

## 2023-07-09 NOTE — Plan of Care (Signed)
  Problem: Fluid Volume: Goal: Hemodynamic stability will improve Outcome: Progressing   Problem: Clinical Measurements: Goal: Diagnostic test results will improve Outcome: Progressing Goal: Signs and symptoms of infection will decrease Outcome: Progressing   Problem: Respiratory: Goal: Ability to maintain adequate ventilation will improve Outcome: Progressing   Problem: Education: Goal: Understanding of CV disease, CV risk reduction, and recovery process will improve Outcome: Progressing Goal: Individualized Educational Video(s) Outcome: Progressing   Problem: Activity: Goal: Ability to return to baseline activity level will improve Outcome: Progressing   Problem: Cardiovascular: Goal: Ability to achieve and maintain adequate cardiovascular perfusion will improve Outcome: Progressing Goal: Vascular access site(s) Level 0-1 will be maintained Outcome: Progressing   Problem: Health Behavior/Discharge Planning: Goal: Ability to safely manage health-related needs after discharge will improve Outcome: Progressing   Problem: Education: Goal: Ability to describe self-care measures that may prevent or decrease complications (Diabetes Survival Skills Education) will improve Outcome: Progressing Goal: Individualized Educational Video(s) Outcome: Progressing   Problem: Coping: Goal: Ability to adjust to condition or change in health will improve Outcome: Progressing   Problem: Fluid Volume: Goal: Ability to maintain a balanced intake and output will improve Outcome: Progressing   Problem: Health Behavior/Discharge Planning: Goal: Ability to identify and utilize available resources and services will improve Outcome: Progressing Goal: Ability to manage health-related needs will improve Outcome: Progressing   Problem: Metabolic: Goal: Ability to maintain appropriate glucose levels will improve Outcome: Progressing   Problem: Nutritional: Goal: Maintenance of adequate  nutrition will improve Outcome: Progressing Goal: Progress toward achieving an optimal weight will improve Outcome: Progressing   Problem: Skin Integrity: Goal: Risk for impaired skin integrity will decrease Outcome: Progressing   Problem: Tissue Perfusion: Goal: Adequacy of tissue perfusion will improve Outcome: Progressing   Problem: Education: Goal: Knowledge of General Education information will improve Description: Including pain rating scale, medication(s)/side effects and non-pharmacologic comfort measures Outcome: Progressing   Problem: Health Behavior/Discharge Planning: Goal: Ability to manage health-related needs will improve Outcome: Progressing   Problem: Clinical Measurements: Goal: Ability to maintain clinical measurements within normal limits will improve Outcome: Progressing Goal: Will remain free from infection Outcome: Progressing Goal: Diagnostic test results will improve Outcome: Progressing Goal: Respiratory complications will improve Outcome: Progressing Goal: Cardiovascular complication will be avoided Outcome: Progressing   Problem: Activity: Goal: Risk for activity intolerance will decrease Outcome: Progressing   Problem: Nutrition: Goal: Adequate nutrition will be maintained Outcome: Progressing   Problem: Coping: Goal: Level of anxiety will decrease Outcome: Progressing   Problem: Elimination: Goal: Will not experience complications related to bowel motility Outcome: Progressing Goal: Will not experience complications related to urinary retention Outcome: Progressing   Problem: Pain Managment: Goal: General experience of comfort will improve Outcome: Progressing   Problem: Safety: Goal: Ability to remain free from injury will improve Outcome: Progressing   Problem: Skin Integrity: Goal: Risk for impaired skin integrity will decrease Outcome: Progressing

## 2023-07-09 NOTE — Progress Notes (Addendum)
   07/09/23 1002  Mobility  Activity Ambulated with assistance in hallway  Level of Assistance Contact guard assist, steadying assist  Assistive Device Front wheel walker (Chair Follow)  Distance Ambulated (ft) 50 ft  Activity Response Tolerated fair  Mobility Referral Yes  $Mobility charge 1 Mobility  Mobility Specialist Start Time (ACUTE ONLY) 0908  Mobility Specialist Stop Time (ACUTE ONLY) 0958  Mobility Specialist Time Calculation (min) (ACUTE ONLY) 50 min   Mobility Specialist: Progress Note  Pre-Mobility: HR 105-112,  SpO2 96% 4LO2 During Mobility:SpO2 80-94% 4L02 Post-Mobility: HR 103, SpO2 99% 4LO2  Pt agreeable to mobility session - received in chair. Required CG throughout using RW. C/o RLE/ Knee weakness and pain rated 7/10 and SOB. Three seated breaks with chair follow required d/t RLE/knee pain and SOB. Quick SpO2 recovery with pursed lip breathing cues. Returned to chair with all needs met - call bell within reach.   Barnie Mort, BS Mobility Specialist Please contact via SecureChat or Rehab office at [phone].

## 2023-07-09 NOTE — Progress Notes (Signed)
   07/08/23 2230  BiPAP/CPAP/SIPAP  EPAP 14 cmH2O  Flow Rate 6 lpm  Patient Home Equipment Yes

## 2023-07-10 DIAGNOSIS — K7581 Nonalcoholic steatohepatitis (NASH): Secondary | ICD-10-CM | POA: Diagnosis not present

## 2023-07-10 DIAGNOSIS — D649 Anemia, unspecified: Secondary | ICD-10-CM | POA: Diagnosis not present

## 2023-07-10 DIAGNOSIS — I5033 Acute on chronic diastolic (congestive) heart failure: Secondary | ICD-10-CM | POA: Diagnosis not present

## 2023-07-10 DIAGNOSIS — K7460 Unspecified cirrhosis of liver: Secondary | ICD-10-CM

## 2023-07-10 DIAGNOSIS — I4891 Unspecified atrial fibrillation: Secondary | ICD-10-CM | POA: Diagnosis not present

## 2023-07-10 LAB — COMPREHENSIVE METABOLIC PANEL
ALT: 22 U/L (ref 0–44)
AST: 28 U/L (ref 15–41)
Albumin: 3.1 g/dL — ABNORMAL LOW (ref 3.5–5.0)
Alkaline Phosphatase: 81 U/L (ref 38–126)
Anion gap: 9 (ref 5–15)
BUN: 25 mg/dL — ABNORMAL HIGH (ref 8–23)
CO2: 27 mmol/L (ref 22–32)
Calcium: 9.1 mg/dL (ref 8.9–10.3)
Chloride: 104 mmol/L (ref 98–111)
Creatinine, Ser: 1.36 mg/dL — ABNORMAL HIGH (ref 0.44–1.00)
GFR, Estimated: 43 mL/min — ABNORMAL LOW (ref >60)
Glucose, Bld: 147 mg/dL — ABNORMAL HIGH (ref 70–99)
Potassium: 3.9 mmol/L (ref 3.5–5.1)
Sodium: 140 mmol/L (ref 135–145)
Total Bilirubin: 4.9 mg/dL — ABNORMAL HIGH (ref 0.3–1.2)
Total Protein: 5.8 g/dL — ABNORMAL LOW (ref 6.5–8.1)

## 2023-07-10 LAB — GLUCOSE, CAPILLARY
Glucose-Capillary: 129 mg/dL — ABNORMAL HIGH (ref 70–99)
Glucose-Capillary: 206 mg/dL — ABNORMAL HIGH (ref 70–99)
Glucose-Capillary: 137 mg/dL — ABNORMAL HIGH (ref 70–99)
Glucose-Capillary: 120 mg/dL — ABNORMAL HIGH (ref 70–99)
Glucose-Capillary: 134 mg/dL — ABNORMAL HIGH (ref 70–99)

## 2023-07-10 LAB — CBC
HCT: 26.8 % — ABNORMAL LOW (ref 36.0–46.0)
Hemoglobin: 8.5 g/dL — ABNORMAL LOW (ref 12.0–15.0)
MCH: 33.3 pg (ref 26.0–34.0)
MCHC: 31.7 g/dL (ref 30.0–36.0)
MCV: 105.1 fL — ABNORMAL HIGH (ref 80.0–100.0)
Platelets: 26 10*3/uL — CL (ref 150–400)
RBC: 2.55 MIL/uL — ABNORMAL LOW (ref 3.87–5.11)
RDW: 18.1 % — ABNORMAL HIGH (ref 11.5–15.5)
WBC: 3 10*3/uL — ABNORMAL LOW (ref 4.0–10.5)
nRBC: 0 % (ref 0.0–0.2)

## 2023-07-10 NOTE — Progress Notes (Signed)
   07/09/23 2230  BiPAP/CPAP/SIPAP  EPAP 14 cmH2O  Flow Rate 3 lpm  Patient Home Equipment Yes

## 2023-07-10 NOTE — Progress Notes (Signed)
PT Cancellation Note  Patient Details Name: Hannah Khan MRN: [id number] DOB: 12-25-1955   Cancelled Treatment:    Reason Eval/Treat Not Completed: Other (comment). Pt out of room at this time working with MS and OT for treatment. Will attempt PT visit at a later time.   Delaina Fetsch 07/10/2023, 2:46 PM

## 2023-07-10 NOTE — Progress Notes (Signed)
PROGRESS NOTE    RIENA WILDERMAN  HQI:[id number] DOB: 01-08-1956 DOA: 07/03/2023 PCP: Darrow Bussing, MD    Chief Complaint  Patient presents with   Hypotension    Brief Narrative:   JOLYNE OXENDINE is a 67 y.o. female with medical history significant of A-fib not on anticoagulation, CHF, hypertension, type 2 diabetes, CKD stage IIIb, Nash cirrhosis, gastroesophageal varices, chronic thrombocytopenia, anemia, abdominal wall cellulitis, severe morbid obesity. Recently admitted for TIPS creation on 06/30/2023 which was unsuccessful but they were able to perform balloon occlusion retrograde transvenous obliteration of the gastric varix. Postprocedure she developed hypotension requiring pressor support. Also developed A-fib with RVR treated with oral doses of metoprolol and Cardizem. Hospital course further complicated by acute blood loss anemia due to small to moderate volume hemoperitoneum. She was discharged from the hospital, patient presents with nausea, vomiting, hypotension, her workup significant for low hemoglobin at 6.3, as well thrombocytopenia (around baseline, she was noted to be in A-fib with RVR as well, hypotensive, so she was admitted for further workup.     Assessment & Plan:   Principal Problem:   Nausea and vomiting Active Problems:   Anemia   Cellulitis, abdominal wall   Chronic heart failure with preserved ejection fraction (HFpEF) (HCC)   Thrombocytopenia (HCC)   Type 2 diabetes mellitus (HCC)   Atrial fibrillation with RVR (HCC)   Liver cirrhosis secondary to NASH (HCC)   Acute on chronic diastolic heart failure (HCC)   Chronic hypotension   Hypoxia   Nausea & vomiting   Acute blood loss anemia Hemoperitoneum -Baseline hemoglobin around 12, status post recent attempted TIPS procedures during recent hospitalizations, unsuccessful, with evidence of bleed/hemoperitoneum, hemoglobin down to 6.3 this admission. -Hemoglobin has been improving with transfusions,  for required 4 units PRBC transfusion, target level > 8, as main factor for her A-fib with RVR thought to be volume depletion, anemia.    Hypotension -Multifactorial, in the setting of A-fib with RVR, anemia, liver cirrhosis -Continue with midodrine.  Can decrease to home dose 10 mg 3 times daily tomorrow -Received IV albumin -Transfuse as needed to keep hemoglobin > 8  -Continue to monitor blood pressure, as increasing her beta-blockers and IV diuresis   Acute on chronic diastolic CHF -She is with evidence of volume overload, diuresis is limited in the setting of hypotension, continue with IV Lasix, cardiology assistance greatly appreciated, with significant IV diuresis at nighttime to avoid low blood pressure daytime.  . -metolazone remains on hold. -Remains significantly volume overloaded, but improving, continue with current dose Lasix at bedtime to avoid hypotension daytime  Decompensated NASH liver cirrhosis Coagulopathy Thrombocytopenia -In the setting of acute blood loss anemia, blood count is low at 19K today, will give another unit of platelet today (so far 3 units) -Will give vitamin K given coagulopathy -Continue with rifaximin -She failed TIPS procedure. -Continue with Lasix as tolerated -Continue with lactulose -Continue with Unna boots bilaterally to help mobilize fluids  Permanent A-fib with RVR -This is in the setting of hypotension, acute blood loss anemia -Difficult to control, cardiology input greatly appreciated, will need to address underlying causes including her anemia, hypertension and volume status -Amiodarone drip initially, has been discontinued currently  -Not a candidate for anticoagulation due to thrombocytopenia, and acute blood loss anemia -Digoxin level, digoxin has been increased to 0.25 mg p.o. daily, will check digoxin level tomorrow prior to discharge.  Level has been increased given level of 0.4, will monitor level intermittently.   -Metoprolol  has been increased to 25 mg p.o. twice daily continue with current dose as blood pressure is stable.  Abdominal wall cellulitis/panniculitis -Initially on Rocephin>> Keflex, finish total 5 days.  Hypokalemia -Pleated, will increase Aldactone as well as she is on IV diuresis.   Acute respiratory failure with hypoxia -Remains on 2 to 3 L nasal cannula. -Bilateral pleural effusion, vascular congestion, hopefully should improve once more stable for diuresis -Encouraged use incentive spirometry and flutter valve -VQ scan negative for PE -To be improving with diuresis, will ambulate today to see if she requires oxygen upon discharge tomorrow.   Elevated troponin Demand ischemia due to anemia, hypertension, non-ACS pattern, EKG nonacute.  AKI on CKD stage IIIb -Her baseline creatinine around 1.4-1.5, it is 2.03 this admission, she is high risk for hepatorenal syndrome, as well IV fluid is challenging in the setting of her volume status, so we will try to manage with IV albumin, and PRBC transfusion, continue with midodrine    Type 2 diabetes Glucose 205.  Last A1c 4.7 in February 2024, his admission A1c is 5.2    DVT prophylaxis: SCD Code Status: Full Family Communication: Discussed with husband at bedside 10/18 Disposition:   Status is: Inpatient    Consultants:  cardiolgoy   Subjective:  No significant events overnight, reports dyspnea has improved  Objective: Vitals:   07/10/23 0400 07/10/23 0450 07/10/23 0811 07/10/23 1121  BP: (!) 110/56  (!) 118/49 (!) 113/54  Pulse: 93 (!) 103 97 88  Resp: 14 20 20  (!) 21  Temp:  98.5 F (36.9 C) (!) 97.5 F (36.4 C) 98.2 F (36.8 C)  TempSrc:  Oral Oral Oral  SpO2: (!) 87% 100% 100% 96%  Weight:      Height:        Intake/Output Summary (Last 24 hours) at 07/10/2023 1407 Last data filed at 07/10/2023 0850 Gross per 24 hour  Intake 424.84 ml  Output 200 ml  Net 224.84 ml   Filed Weights   07/04/23 1827 07/06/23 0420  07/07/23 0318  Weight: (!) 151.2 kg (!) 154 kg (!) 156.9 kg    Examination:   Awake Alert, Oriented X 3, No new F.N deficits, Normal affect Symmetrical Chest wall movement, diminished at the bases Regular,+ murmur, no Parasternal Heave +ve B.Sounds, Abd Soft, No tenderness, No rebound - guarding or rigidity. Ace wraps around the leg, edema +2    Data Reviewed: I have personally reviewed following labs and imaging studies  CBC: Recent Labs  Lab 07/06/23 0919 07/07/23 0852 07/08/23 0401 07/09/23 0312 07/10/23 0243  WBC 3.4* 3.1* 3.2* 3.7* 3.0*  HGB 8.4* 8.0* 8.4* 8.6* 8.5*  HCT 25.5* 24.2* 25.8* 26.8* 26.8*  MCV 102.0* 103.9* 101.6* 102.3* 105.1*  PLT 25* 19* 24* 28* 26*    Basic Metabolic Panel: Recent Labs  Lab 07/06/23 0919 07/07/23 0852 07/08/23 0401 07/09/23 0312 07/10/23 0243  NA 135 136 139 139 140  K 3.4* 3.3* 3.4* 3.8 3.9  CL 102 101 100 105 104  CO2 21* 25 24 26 27   GLUCOSE 179* 209* 149* 149* 147*  BUN 26* 27* 28* 26* 25*  CREATININE 1.73* 1.66* 1.53* 1.30* 1.36*  CALCIUM 8.7* 8.8* 9.8 8.9 9.1    GFR: Estimated Creatinine Clearance: 63.2 mL/min (A) (by C-G formula based on SCr of 1.36 mg/dL (H)).  Liver Function Tests: Recent Labs  Lab 07/06/23 0919 07/07/23 0852 07/08/23 0401 07/09/23 0312 07/10/23 0243  AST 43* 34 31 30 28   ALT 36 30  28 26 22   ALKPHOS 104 86 85 87 81  BILITOT 5.2* 5.7* 6.5* 5.1* 4.9*  PROT 6.0* 6.0* 5.8* 5.9* 5.8*  ALBUMIN 3.1* 3.4* 3.2* 3.2* 3.1*    CBG: Recent Labs  Lab 07/09/23 1941 07/09/23 2316 07/10/23 0427 07/10/23 0810 07/10/23 1120  GLUCAP 199* 114* 129* 206* 137*     Recent Results (from the past 240 hour(s))  Culture, blood (Routine X 2) w Reflex to ID Panel     Status: None   Collection Time: 07/04/23  2:02 AM   Specimen: BLOOD RIGHT HAND  Result Value Ref Range Status   Specimen Description BLOOD RIGHT HAND  Final   Special Requests   Final    BOTTLES DRAWN AEROBIC AND ANAEROBIC Blood  Culture adequate volume   Culture   Final    NO GROWTH 5 DAYS Performed at [facility], [address] [place], [place] [address]    Report Status 07/09/2023 FINAL  Final  Culture, blood (Routine X 2) w Reflex to ID Panel     Status: Abnormal   Collection Time: 07/04/23  2:02 AM   Specimen: BLOOD  Result Value Ref Range Status   Specimen Description BLOOD LEFT ANTECUBITAL  Final   Special Requests   Final    BOTTLES DRAWN AEROBIC AND ANAEROBIC Blood Culture adequate volume   Culture  Setup Time   Final    GRAM POSITIVE COCCI IN CLUSTERS AEROBIC BOTTLE ONLY CRITICAL RESULT CALLED TO, READ BACK BY AND VERIFIED WITH: PHARMD M. BITONTI 07/06/2023 @ 0724 BY AB    Culture (A)  Final    STAPHYLOCOCCUS EPIDERMIDIS THE SIGNIFICANCE OF ISOLATING THIS ORGANISM FROM A SINGLE SET OF BLOOD CULTURES WHEN MULTIPLE SETS ARE DRAWN IS UNCERTAIN. PLEASE NOTIFY THE MICROBIOLOGY DEPARTMENT WITHIN ONE WEEK IF SPECIATION AND SENSITIVITIES ARE REQUIRED. Performed at [facility], [address], [place], [place] [address]    Report Status 07/07/2023 FINAL  Final  Blood Culture ID Panel (Reflexed)     Status: Abnormal   Collection Time: 07/04/23  2:02 AM  Result Value Ref Range Status   Enterococcus faecalis NOT DETECTED NOT DETECTED Final   Enterococcus Faecium NOT DETECTED NOT DETECTED Final   Listeria monocytogenes NOT DETECTED NOT DETECTED Final   Staphylococcus species DETECTED (A) NOT DETECTED Final    Comment: CRITICAL RESULT CALLED TO, READ BACK BY AND VERIFIED WITH: PHARMD M. BITONTI 07/06/2023 @ 0724 BY AB    Staphylococcus aureus (BCID) NOT DETECTED NOT DETECTED Final   Staphylococcus epidermidis DETECTED (A) NOT DETECTED Final    Comment: Methicillin (oxacillin) resistant coagulase negative staphylococcus. Possible blood culture contaminant (unless isolated from more than one blood culture draw or clinical case suggests pathogenicity). No antibiotic treatment is indicated for  blood  culture contaminants. CRITICAL RESULT CALLED TO, READ BACK BY AND VERIFIED WITH: PHARMD M. BITONTI 07/06/2023 @ 0724 BY AB    Staphylococcus lugdunensis NOT DETECTED NOT DETECTED Final   Streptococcus species NOT DETECTED NOT DETECTED Final   Streptococcus agalactiae NOT DETECTED NOT DETECTED Final   Streptococcus pneumoniae NOT DETECTED NOT DETECTED Final   Streptococcus pyogenes NOT DETECTED NOT DETECTED Final   A.calcoaceticus-baumannii NOT DETECTED NOT DETECTED Final   Bacteroides fragilis NOT DETECTED NOT DETECTED Final   Enterobacterales NOT DETECTED NOT DETECTED Final   Enterobacter cloacae complex NOT DETECTED NOT DETECTED Final   Escherichia coli NOT DETECTED NOT DETECTED Final   Klebsiella aerogenes NOT DETECTED NOT DETECTED Final   Klebsiella  oxytoca NOT DETECTED NOT DETECTED Final   Klebsiella pneumoniae NOT DETECTED NOT DETECTED Final   Proteus species NOT DETECTED NOT DETECTED Final   Salmonella species NOT DETECTED NOT DETECTED Final   Serratia marcescens NOT DETECTED NOT DETECTED Final   Haemophilus influenzae NOT DETECTED NOT DETECTED Final   Neisseria meningitidis NOT DETECTED NOT DETECTED Final   Pseudomonas aeruginosa NOT DETECTED NOT DETECTED Final   Stenotrophomonas maltophilia NOT DETECTED NOT DETECTED Final   Candida albicans NOT DETECTED NOT DETECTED Final   Candida auris NOT DETECTED NOT DETECTED Final   Candida glabrata NOT DETECTED NOT DETECTED Final   Candida krusei NOT DETECTED NOT DETECTED Final   Candida parapsilosis NOT DETECTED NOT DETECTED Final   Candida tropicalis NOT DETECTED NOT DETECTED Final   Cryptococcus neoformans/gattii NOT DETECTED NOT DETECTED Final   Methicillin resistance mecA/C DETECTED (A) NOT DETECTED Final    Comment: CRITICAL RESULT CALLED TO, READ BACK BY AND VERIFIED WITH: PHARMD M. BITONTI 07/06/2023 @ 0724 BY AB Performed at [facility], [address], [place], [place] [address]   MRSA Next Gen by PCR, Nasal      Status: None   Collection Time: 07/04/23  6:10 AM   Specimen: Nasal Mucosa; Nasal Swab  Result Value Ref Range Status   MRSA by PCR Next Gen NOT DETECTED NOT DETECTED Final    Comment: (NOTE) The GeneXpert MRSA Assay (FDA approved for NASAL specimens only), is one component of a comprehensive MRSA colonization surveillance program. It is not intended to diagnose MRSA infection nor to guide or monitor treatment for MRSA infections. Test performance is not FDA approved in patients less than 45 years old. Performed at [facility], [address], [place], [place] [address]          Radiology Studies: No results found.      Scheduled Meds:  Chlorhexidine Gluconate Cloth  6 each Topical Daily   digoxin  0.25 mg Oral Daily   insulin aspart  0-9 Units Subcutaneous Q4H   lactulose  20 g Oral TID   lidocaine  2 patch Transdermal Q24H   metoprolol tartrate  25 mg Oral Q12H   midodrine  20 mg Oral TID WC   rifaximin  550 mg Oral BID   sodium chloride flush  10-40 mL Intracatheter Q12H   spironolactone  100 mg Oral Daily   Continuous Infusions:  furosemide 120 mg (07/09/23 2209)      LOS: 6 days       Huey Bienenstock, MD [facility]   To contact the attending provider between 7A-7P or the covering provider during after hours 7P-7A, please log into the web site [email or web address] and access using universal [place] password for that web site. If you do not have the password, please call the hospital operator.  07/10/2023, 2:07 PM

## 2023-07-10 NOTE — Progress Notes (Signed)
SATURATION QUALIFICATIONS: (This note is used to comply with regulatory documentation for home oxygen)  Patient Saturations on Room Air at Rest = 97%  Patient Saturations on Room Air while Ambulating = 95%  Patient Saturations on N/A Liters of oxygen while Ambulating = N/A%  Please briefly explain why patient needs home oxygen: 

## 2023-07-10 NOTE — Progress Notes (Signed)
Occupational Therapy Treatment Patient Details Name: NEFELI BIRT MRN: [id number] DOB: Oct 03, 1955 Today's Date: 07/10/2023   History of present illness Patient is a 67 year old female with recent hospital stay with attempted TIPS procedure, nausea and vomiting, hypotension, acute blood loss anemia, hemoperitoneum.  Permanent A-fib with RVR, decompensated NASH liver cirrhosis, thrombocytopenia, abdominal wall cellulitis/panniculitis, hypoxia.   OT comments  Partial session with mobility specialists with chair follow to assess O2. Session focused on education on use of AE/compensatory strategies to maximize independence with self care. Educated on use of energy conservation strategies, including pursed lip breathing. O2 probe switched to R ear during mobility due to poor pleth on finger during mobility. SpO2 maintained above 95 on RA with 2/4 DOE and cues for pursed lip breathing. Pt states she is not walking enough. Encouraged pt to take frequent short walks with staff. Rollator left with mobility specialists. Acute OT will continue to follow.       If plan is discharge home, recommend the following:  A lot of help with bathing/dressing/bathroom;Assistance with cooking/housework   Equipment Recommendations  None recommended by OT    Recommendations for Other Services      Precautions / Restrictions Precautions Precautions: Fall Precaution Comments: watch O2 sats       Mobility Bed Mobility               General bed mobility comments: in recliner on arrival    Transfers Overall transfer level: Needs assistance   Transfers: Sit to/from Stand Sit to Stand: Supervision                 Balance     Sitting balance-Leahy Scale: Good       Standing balance-Leahy Scale: Fair                             ADL either performed or assessed with clinical judgement   ADL                                         General ADL Comments: Pt  states she wants to be able to do more for herself. Educated on availability to toilet tongs for pericare as well as bathing; interesting in trying wide sock aid; discussed various options to try regarding pericare and bathing    Extremity/Trunk Assessment Upper Extremity Assessment RUE Deficits / Details: lmited use of R shoulder due to previous injury; 5# lifting restriction per pt  Has adapted brush to compensate for loss of R shoulder ROM          Vision       Perception     Praxis      Cognition Arousal: Alert Behavior During Therapy: WFL for tasks assessed/performed Overall Cognitive Status: Within Functional Limits for tasks assessed                                          Exercises Exercises: Other exercises Other Exercises Other Exercises: pursed lip breathing    Shoulder Instructions       General Comments      Pertinent Vitals/ Pain       Pain Assessment Pain Assessment: Faces Faces Pain Scale: Hurts a little bit Pain Location: R  shoulder Pain Descriptors / Indicators: Discomfort Pain Intervention(s): Limited activity within patient's tolerance  Home Living                                          Prior Functioning/Environment              Frequency  Min 1X/week        Progress Toward Goals  OT Goals(current goals can now be found in the care plan section)  Progress towards OT goals: Progressing toward goals  Acute Rehab OT Goals Patient Stated Goal: to do more for herself OT Goal Formulation: With patient/family Time For Goal Achievement: 07/21/23 Potential to Achieve Goals: Good ADL Goals Pt Will Perform Grooming: with modified independence;standing Pt Will Transfer to Toilet: with supervision;ambulating Pt Will Perform Toileting - Clothing Manipulation and hygiene: with supervision;sit to/from stand;with adaptive equipment Additional ADL Goal #1: Pt will demonstrate independent use of energy  conservation strategies prn to complete functional tasks  Plan      Co-evaluation                 AM-PAC OT "6 Clicks" Daily Activity     Outcome Measure   Help from another person eating meals?: None Help from another person taking care of personal grooming?: A Little Help from another person toileting, which includes using toliet, bedpan, or urinal?: A Lot Help from another person bathing (including washing, rinsing, drying)?: A Lot Help from another person to put on and taking off regular upper body clothing?: A Little Help from another person to put on and taking off regular lower body clothing?: A Lot 6 Click Score: 16    End of Session Equipment Utilized During Treatment: Gait belt;Rollator (4 wheels)  OT Visit Diagnosis: Unsteadiness on feet (R26.81)   Activity Tolerance Patient tolerated treatment well   Patient Left Other (comment) (walking with mobility Specialists)   Nurse Communication Mobility status (una boot wrapping needs attention - ortho tech made aware)        Time: 7829-5621 OT Time Calculation (min): 50 min  Charges: OT General Charges $OT Visit: 1 Visit OT Treatments $Self Care/Home Management : 38-52 mins  Luisa Dago, OT/L   Acute OT Clinical Specialist Acute Rehabilitation Services Pager [phone] Office [phone]   [facility] 07/10/2023, 4:48 PM

## 2023-07-10 NOTE — Progress Notes (Signed)
   07/10/23 2004  BiPAP/CPAP/SIPAP  $ Non-Invasive Ventilator  Non-Invasive Vent Subsequent  BiPAP/CPAP/SIPAP Pt Type Adult  BiPAP/CPAP/SIPAP Resmed  Mask Type Nasal pillows  Mask Size Large  Respiratory Rate 19 breaths/min  EPAP 14 cmH2O  Flow Rate 3 lpm  Patient Home Equipment Yes  Safety Check Completed by RT for Home Unit Yes, no issues noted   Pt placed herself on home CPAP for night time use.

## 2023-07-10 NOTE — Plan of Care (Signed)
  Problem: Fluid Volume: Goal: Hemodynamic stability will improve Outcome: Progressing   Problem: Clinical Measurements: Goal: Diagnostic test results will improve Outcome: Progressing Goal: Signs and symptoms of infection will decrease Outcome: Progressing   Problem: Respiratory: Goal: Ability to maintain adequate ventilation will improve Outcome: Progressing   Problem: Education: Goal: Understanding of CV disease, CV risk reduction, and recovery process will improve Outcome: Progressing Goal: Individualized Educational Video(s) Outcome: Progressing   Problem: Activity: Goal: Ability to return to baseline activity level will improve Outcome: Progressing   Problem: Cardiovascular: Goal: Ability to achieve and maintain adequate cardiovascular perfusion will improve Outcome: Progressing Goal: Vascular access site(s) Level 0-1 will be maintained Outcome: Progressing   Problem: Health Behavior/Discharge Planning: Goal: Ability to safely manage health-related needs after discharge will improve Outcome: Progressing   Problem: Education: Goal: Ability to describe self-care measures that may prevent or decrease complications (Diabetes Survival Skills Education) will improve Outcome: Progressing Goal: Individualized Educational Video(s) Outcome: Progressing   Problem: Coping: Goal: Ability to adjust to condition or change in health will improve Outcome: Progressing   Problem: Fluid Volume: Goal: Ability to maintain a balanced intake and output will improve Outcome: Progressing   Problem: Health Behavior/Discharge Planning: Goal: Ability to identify and utilize available resources and services will improve Outcome: Progressing Goal: Ability to manage health-related needs will improve Outcome: Progressing   Problem: Metabolic: Goal: Ability to maintain appropriate glucose levels will improve Outcome: Progressing   Problem: Nutritional: Goal: Maintenance of adequate  nutrition will improve Outcome: Progressing Goal: Progress toward achieving an optimal weight will improve Outcome: Progressing   Problem: Skin Integrity: Goal: Risk for impaired skin integrity will decrease Outcome: Progressing   Problem: Tissue Perfusion: Goal: Adequacy of tissue perfusion will improve Outcome: Progressing   Problem: Education: Goal: Knowledge of General Education information will improve Description: Including pain rating scale, medication(s)/side effects and non-pharmacologic comfort measures Outcome: Progressing   Problem: Health Behavior/Discharge Planning: Goal: Ability to manage health-related needs will improve Outcome: Progressing   Problem: Clinical Measurements: Goal: Ability to maintain clinical measurements within normal limits will improve Outcome: Progressing Goal: Will remain free from infection Outcome: Progressing Goal: Diagnostic test results will improve Outcome: Progressing Goal: Respiratory complications will improve Outcome: Progressing Goal: Cardiovascular complication will be avoided Outcome: Progressing   Problem: Activity: Goal: Risk for activity intolerance will decrease Outcome: Progressing   Problem: Nutrition: Goal: Adequate nutrition will be maintained Outcome: Progressing   Problem: Coping: Goal: Level of anxiety will decrease Outcome: Progressing   Problem: Elimination: Goal: Will not experience complications related to bowel motility Outcome: Progressing Goal: Will not experience complications related to urinary retention Outcome: Progressing   Problem: Pain Managment: Goal: General experience of comfort will improve Outcome: Progressing   Problem: Safety: Goal: Ability to remain free from injury will improve Outcome: Progressing   Problem: Skin Integrity: Goal: Risk for impaired skin integrity will decrease Outcome: Progressing

## 2023-07-10 NOTE — Progress Notes (Signed)
Progress Note  Patient Name: LAHOMA CONNICK Date of Encounter: 07/10/2023  Primary Cardiologist:   Chilton Si, MD   Subjective   Incomplete I/Os.  Stable renal function (Cr 1.53>1.30>1.36). Platelets 26, Hgb 8.5.  Afib rates have improved, in 70-80s this morning.  She denies any chest pain or dyspnea.  Inpatient Medications    Scheduled Meds:  Chlorhexidine Gluconate Cloth  6 each Topical Daily   digoxin  0.25 mg Oral Daily   insulin aspart  0-9 Units Subcutaneous Q4H   lactulose  20 g Oral TID   lidocaine  2 patch Transdermal Q24H   metoprolol tartrate  25 mg Oral Q12H   midodrine  20 mg Oral TID WC   rifaximin  550 mg Oral BID   sodium chloride flush  10-40 mL Intracatheter Q12H   spironolactone  100 mg Oral Daily   Continuous Infusions:  furosemide 120 mg (07/09/23 2209)   PRN Meds: naLOXone (NARCAN)  injection, ondansetron **OR** ondansetron (ZOFRAN) IV, mouth rinse, oxyCODONE, sodium chloride flush   Vital Signs    Vitals:   07/10/23 0300 07/10/23 0400 07/10/23 0450 07/10/23 0811  BP: (!) 108/55 (!) 110/56  (!) 118/49  Pulse: 92 93 (!) 103 97  Resp: 16 14 20 20   Temp:   98.5 F (36.9 C) (!) 97.5 F (36.4 C)  TempSrc:   Oral Oral  SpO2: 92% (!) 87% 100% 100%  Weight:      Height:        Intake/Output Summary (Last 24 hours) at 07/10/2023 1022 Last data filed at 07/10/2023 0850 Gross per 24 hour  Intake 424.84 ml  Output 400 ml  Net 24.84 ml   Filed Weights   07/04/23 1827 07/06/23 0420 07/07/23 0318  Weight: (!) 151.2 kg (!) 154 kg (!) 156.9 kg    Telemetry    Atrial fib with relatively controlled rate - Personally Reviewed  ECG    NA - Personally Reviewed  Physical Exam   GEN: No acute distress.   Neck: No JVD appreciated but difficult to assess given habitus Cardiac: Irregular RR, 3/6 murmurs Respiratory: Clear , no diasotlic to auscultation bilaterally. GI: Soft, nontender, non-distended  MS: Ace wraps around legs Neuro:   Nonfocal  Psych: Normal affect   Labs    Chemistry Recent Labs  Lab 07/08/23 0401 07/09/23 0312 07/10/23 0243  NA 139 139 140  K 3.4* 3.8 3.9  CL 100 105 104  CO2 24 26 27   GLUCOSE 149* 149* 147*  BUN 28* 26* 25*  CREATININE 1.53* 1.30* 1.36*  CALCIUM 9.8 8.9 9.1  PROT 5.8* 5.9* 5.8*  ALBUMIN 3.2* 3.2* 3.1*  AST 31 30 28   ALT 28 26 22   ALKPHOS 85 87 81  BILITOT 6.5* 5.1* 4.9*  GFRNONAA 37* 45* 43*  ANIONGAP 15 8 9      Hematology Recent Labs  Lab 07/08/23 0401 07/09/23 0312 07/10/23 0243  WBC 3.2* 3.7* 3.0*  RBC 2.54* 2.62* 2.55*  HGB 8.4* 8.6* 8.5*  HCT 25.8* 26.8* 26.8*  MCV 101.6* 102.3* 105.1*  MCH 33.1 32.8 33.3  MCHC 32.6 32.1 31.7  RDW 18.8* 18.5* 18.1*  PLT 24* 28* 26*    Cardiac EnzymesNo results for input(s): "TROPONINI" in the last 168 hours. No results for input(s): "TROPIPOC" in the last 168 hours.   BNP Recent Labs  Lab 07/04/23 0157 07/08/23 0401 07/09/23 0312  BNP 531.6* 926.8* 594.3*     DDimer No results for input(s): "DDIMER" in  the last 168 hours.   Radiology    DG Chest Port 1 View  Result Date: 07/08/2023 CLINICAL DATA:  Hypoxia EXAM: PORTABLE CHEST 1 VIEW COMPARISON:  07/06/2023 FINDINGS: Right PICC line remains in place. Stable cardiomegaly. Progressive bilateral airspace opacities including a more confluent right upper lobe opacity. Probable small bilateral pleural effusions. IMPRESSION: 1. Progressive bilateral airspace opacities including a more confluent right upper lobe opacity. Findings may reflect pulmonary edema or multifocal pneumonia. 2. Probable small bilateral pleural effusions. Electronically Signed   By: Duanne Guess D.O.   On: 07/08/2023 16:38    Cardiac Studies   NA  Patient Profile     67 y.o. female with a hx of HFpEF, morbid obesity, OSA, NASH cirrhosis, esophageal varices, history of hepatic encephalopathy, s/p unsuccessful TIPS 06/30/2023, thrombocytopenia, type 2 diabetes, aortic stenosis,  vasovagal syncope, and permanent atrial fibrillation, recurrent panniculitis, cardiology is following for acute CHF and A fib with RVR. S/P recent unsuccessful TIPS on 06/30/23, complicated by hemoperitoneum on CT post procedure. She came in initially with nausea, vomiting, hypotension, A fib RVR, in the setting of acute blood loss anemia and AKI.   Assessment & Plan     Acute blood loss anemia:Hemoglobin 6.3 on admission.  Due to hemoperitoneum.  Has received 4 units PRBCs.  Hgb stable at 8.5  Hypotension: Likely multifactorial in setting of acute blood loss anemia, cirrhosis, A-fib.  Status post albumin.  Continue midodrine, can likely reduced dose back to 10 mg TID  Acute on chronic diastolic heart failure.  Volume overloaded on exam.  Started on IV Lasix and p.o. spironolactone, can likely convert to PO lasix tomorrow  Thrombocytopenia: In setting of cirrhosis.  Has received multiple units of platelets  Permanent atrial fibrillation: Has been in A-fib with RVR in setting of acute blood loss anemia and hypotension.  Not a candidate for anticoagulation in setting of acute blood loss anemia and thrombocytopenia.  On metoprolol 25 mg twice daily and digoxin 0.25 mg daily for rate control.  Rates currently well controlled.  For questions or updates, please contact [facility] Please consult [email or web address] for contact info under Cardiology/STEMI.   Signed, Little Ishikawa, MD  07/10/2023, 10:22 AM

## 2023-07-10 NOTE — Plan of Care (Signed)
  Problem: Fluid Volume: Goal: Hemodynamic stability will improve 07/10/2023 1723 by Galen Manila, RN Outcome: Progressing 07/10/2023 1723 by Galen Manila, RN Outcome: Progressing   Problem: Clinical Measurements: Goal: Diagnostic test results will improve 07/10/2023 1723 by Galen Manila, RN Outcome: Progressing 07/10/2023 1723 by Galen Manila, RN Outcome: Progressing Goal: Signs and symptoms of infection will decrease 07/10/2023 1723 by Galen Manila, RN Outcome: Progressing 07/10/2023 1723 by Galen Manila, RN Outcome: Progressing   Problem: Respiratory: Goal: Ability to maintain adequate ventilation will improve 07/10/2023 1723 by Galen Manila, RN Outcome: Progressing 07/10/2023 1723 by Galen Manila, RN Outcome: Progressing   Problem: Education: Goal: Understanding of CV disease, CV risk reduction, and recovery process will improve 07/10/2023 1723 by Galen Manila, RN Outcome: Progressing 07/10/2023 1723 by Galen Manila, RN Outcome: Progressing Goal: Individualized Educational Video(s) 07/10/2023 1723 by Galen Manila, RN Outcome: Progressing 07/10/2023 1723 by Galen Manila, RN Outcome: Progressing   Problem: Activity: Goal: Ability to return to baseline activity level will improve 07/10/2023 1723 by Galen Manila, RN Outcome: Progressing 07/10/2023 1723 by Galen Manila, RN Outcome: Progressing   Problem: Cardiovascular: Goal: Ability to achieve and maintain adequate cardiovascular perfusion will improve Outcome: Progressing Goal: Vascular access site(s) Level 0-1 will be maintained Outcome: Progressing   Problem: Health Behavior/Discharge Planning: Goal: Ability to safely manage health-related needs after discharge will improve Outcome: Progressing   Problem: Education: Goal: Ability to describe self-care measures that may prevent or  decrease complications (Diabetes Survival Skills Education) will improve Outcome: Progressing Goal: Individualized Educational Video(s) Outcome: Progressing   Problem: Coping: Goal: Ability to adjust to condition or change in health will improve Outcome: Progressing   Problem: Fluid Volume: Goal: Ability to maintain a balanced intake and output will improve Outcome: Progressing   Problem: Health Behavior/Discharge Planning: Goal: Ability to identify and utilize available resources and services will improve Outcome: Progressing Goal: Ability to manage health-related needs will improve Outcome: Progressing   Problem: Metabolic: Goal: Ability to maintain appropriate glucose levels will improve Outcome: Progressing   Problem: Nutritional: Goal: Maintenance of adequate nutrition will improve Outcome: Progressing Goal: Progress toward achieving an optimal weight will improve Outcome: Progressing   Problem: Skin Integrity: Goal: Risk for impaired skin integrity will decrease Outcome: Progressing   Problem: Tissue Perfusion: Goal: Adequacy of tissue perfusion will improve Outcome: Progressing   Problem: Education: Goal: Knowledge of General Education information will improve Description: Including pain rating scale, medication(s)/side effects and non-pharmacologic comfort measures Outcome: Progressing   Problem: Health Behavior/Discharge Planning: Goal: Ability to manage health-related needs will improve Outcome: Progressing   Problem: Clinical Measurements: Goal: Ability to maintain clinical measurements within normal limits will improve Outcome: Progressing Goal: Will remain free from infection Outcome: Progressing Goal: Diagnostic test results will improve Outcome: Progressing Goal: Respiratory complications will improve Outcome: Progressing Goal: Cardiovascular complication will be avoided Outcome: Progressing   Problem: Activity: Goal: Risk for activity  intolerance will decrease Outcome: Progressing   Problem: Nutrition: Goal: Adequate nutrition will be maintained Outcome: Progressing   Problem: Coping: Goal: Level of anxiety will decrease Outcome: Progressing   Problem: Elimination: Goal: Will not experience complications related to bowel motility Outcome: Progressing Goal: Will not experience complications related to urinary retention Outcome: Progressing   Problem: Pain Managment: Goal: General experience of comfort will improve Outcome: Progressing   Problem: Safety: Goal: Ability to remain free from injury will improve Outcome: Progressing   Problem: Skin Integrity: Goal: Risk for impaired skin integrity will decrease Outcome: Progressing

## 2023-07-10 NOTE — Progress Notes (Signed)
Mobility Specialist Progress Note;   07/10/23 1410  Mobility  Activity Ambulated with assistance in hallway  Level of Assistance Contact guard assist, steadying assist  Assistive Device Front wheel walker  Distance Ambulated (ft) 90 ft  Activity Response Tolerated well  Mobility Referral Yes  $Mobility charge 1 Mobility  Mobility Specialist Start Time (ACUTE ONLY) 1410  Mobility Specialist Stop Time (ACUTE ONLY) 1430  Mobility Specialist Time Calculation (min) (ACUTE ONLY) 20 min   Pt agreeable to mobility post-OT session. Required minG assistance during ambulation. Continued encouraging pursed lip breathing throughout ambulation. VSS on RA while ambulating. Requested assistance to [facility] once back in room, void successful. Assisted with pericare. Pt back in chair with all needs met.   Caesar Bookman Mobility Specialist Please contact via SecureChat or Rehab Office [phone]

## 2023-07-11 ENCOUNTER — Other Ambulatory Visit (HOSPITAL_COMMUNITY): Payer: Self-pay

## 2023-07-11 ENCOUNTER — Encounter: Payer: Self-pay | Admitting: Oncology

## 2023-07-11 DIAGNOSIS — K7581 Nonalcoholic steatohepatitis (NASH): Secondary | ICD-10-CM | POA: Diagnosis not present

## 2023-07-11 DIAGNOSIS — I9589 Other hypotension: Secondary | ICD-10-CM | POA: Diagnosis not present

## 2023-07-11 DIAGNOSIS — D649 Anemia, unspecified: Secondary | ICD-10-CM | POA: Diagnosis not present

## 2023-07-11 DIAGNOSIS — I5033 Acute on chronic diastolic (congestive) heart failure: Secondary | ICD-10-CM | POA: Diagnosis not present

## 2023-07-11 DIAGNOSIS — I4891 Unspecified atrial fibrillation: Secondary | ICD-10-CM | POA: Diagnosis not present

## 2023-07-11 LAB — TYPE AND SCREEN
ABO/RH(D): O POS
Antibody Screen: NEGATIVE
Unit division: 0
Unit division: 0

## 2023-07-11 LAB — CBC
HCT: 28.1 % — ABNORMAL LOW (ref 36.0–46.0)
Hemoglobin: 9 g/dL — ABNORMAL LOW (ref 12.0–15.0)
MCH: 33.6 pg (ref 26.0–34.0)
MCHC: 32 g/dL (ref 30.0–36.0)
MCV: 104.9 fL — ABNORMAL HIGH (ref 80.0–100.0)
Platelets: 36 10*3/uL — ABNORMAL LOW (ref 150–400)
RBC: 2.68 MIL/uL — ABNORMAL LOW (ref 3.87–5.11)
RDW: 18.7 % — ABNORMAL HIGH (ref 11.5–15.5)
WBC: 3.3 10*3/uL — ABNORMAL LOW (ref 4.0–10.5)
nRBC: 0 % (ref 0.0–0.2)

## 2023-07-11 LAB — DIGOXIN LEVEL: Digoxin Level: 0.5 ng/mL — ABNORMAL LOW (ref 0.8–2.0)

## 2023-07-11 LAB — COMPREHENSIVE METABOLIC PANEL
ALT: 24 U/L (ref 0–44)
AST: 29 U/L (ref 15–41)
Albumin: 3.1 g/dL — ABNORMAL LOW (ref 3.5–5.0)
Alkaline Phosphatase: 78 U/L (ref 38–126)
Anion gap: 10 (ref 5–15)
BUN: 22 mg/dL (ref 8–23)
CO2: 26 mmol/L (ref 22–32)
Calcium: 9.3 mg/dL (ref 8.9–10.3)
Chloride: 104 mmol/L (ref 98–111)
Creatinine, Ser: 1.26 mg/dL — ABNORMAL HIGH (ref 0.44–1.00)
GFR, Estimated: 47 mL/min — ABNORMAL LOW (ref >60)
Glucose, Bld: 136 mg/dL — ABNORMAL HIGH (ref 70–99)
Potassium: 3.7 mmol/L (ref 3.5–5.1)
Sodium: 140 mmol/L (ref 135–145)
Total Bilirubin: 4.3 mg/dL — ABNORMAL HIGH (ref 0.3–1.2)
Total Protein: 6.1 g/dL — ABNORMAL LOW (ref 6.5–8.1)

## 2023-07-11 LAB — GLUCOSE, CAPILLARY
Glucose-Capillary: 116 mg/dL — ABNORMAL HIGH (ref 70–99)
Glucose-Capillary: 131 mg/dL — ABNORMAL HIGH (ref 70–99)
Glucose-Capillary: 178 mg/dL — ABNORMAL HIGH (ref 70–99)
Glucose-Capillary: 187 mg/dL — ABNORMAL HIGH (ref 70–99)

## 2023-07-11 LAB — BPAM RBC
Blood Product Expiration Date: 202411132359
Blood Product Expiration Date: 202411132359
ISSUE DATE / TIME: 202410181719
Unit Type and Rh: 5100
Unit Type and Rh: 5100

## 2023-07-11 MED ORDER — TORSEMIDE 40 MG PO TABS
ORAL_TABLET | ORAL | 0 refills | Status: DC
  Filled 2023-07-11: qty 90, 30d supply, fill #0
  Filled 2023-07-11: qty 90, 46d supply, fill #0

## 2023-07-11 MED ORDER — METOPROLOL TARTRATE 25 MG PO TABS
25.0000 mg | ORAL_TABLET | Freq: Two times a day (BID) | ORAL | 0 refills | Status: DC
  Filled 2023-07-11 (×2): qty 60, 30d supply, fill #0

## 2023-07-11 MED ORDER — SPIRONOLACTONE 25 MG PO TABS: 50.0000 mg | ORAL_TABLET | Freq: Every day | ORAL | Status: DC

## 2023-07-11 MED ORDER — MIDODRINE HCL 5 MG PO TABS
10.0000 mg | ORAL_TABLET | Freq: Three times a day (TID) | ORAL | Status: DC
  Administered 2023-07-11 (×2): 10 mg via ORAL
  Filled 2023-07-11 (×2): qty 2

## 2023-07-11 MED ORDER — DIGOXIN 250 MCG PO TABS
0.2500 mg | ORAL_TABLET | Freq: Every day | ORAL | 0 refills | Status: DC
  Filled 2023-07-11 (×2): qty 30, 30d supply, fill #0

## 2023-07-11 MED ORDER — SPIRONOLACTONE 25 MG PO TABS: 25.0000 mg | ORAL_TABLET | Freq: Every day | ORAL | Status: DC

## 2023-07-11 MED ORDER — SPIRONOLACTONE 25 MG PO TABS
25.0000 mg | ORAL_TABLET | Freq: Every day | ORAL | 0 refills | Status: DC
  Filled 2023-07-11: qty 30, 30d supply, fill #0

## 2023-07-11 MED ORDER — TORSEMIDE 20 MG PO TABS
80.0000 mg | ORAL_TABLET | Freq: Every day | ORAL | Status: DC
  Administered 2023-07-11: 80 mg via ORAL
  Filled 2023-07-11: qty 4

## 2023-07-11 NOTE — Plan of Care (Signed)
  Problem: Fluid Volume: Goal: Hemodynamic stability will improve Outcome: Completed/Met   Problem: Clinical Measurements: Goal: Diagnostic test results will improve Outcome: Completed/Met Goal: Signs and symptoms of infection will decrease Outcome: Completed/Met   Problem: Respiratory: Goal: Ability to maintain adequate ventilation will improve Outcome: Completed/Met   Problem: Education: Goal: Understanding of CV disease, CV risk reduction, and recovery process will improve Outcome: Completed/Met Goal: Individualized Educational Video(s) Outcome: Completed/Met   Problem: Activity: Goal: Ability to return to baseline activity level will improve Outcome: Completed/Met   Problem: Cardiovascular: Goal: Ability to achieve and maintain adequate cardiovascular perfusion will improve Outcome: Completed/Met Goal: Vascular access site(s) Level 0-1 will be maintained Outcome: Completed/Met   Problem: Health Behavior/Discharge Planning: Goal: Ability to safely manage health-related needs after discharge will improve Outcome: Completed/Met   Problem: Education: Goal: Ability to describe self-care measures that may prevent or decrease complications (Diabetes Survival Skills Education) will improve Outcome: Completed/Met Goal: Individualized Educational Video(s) Outcome: Completed/Met   Problem: Coping: Goal: Ability to adjust to condition or change in health will improve Outcome: Completed/Met   Problem: Fluid Volume: Goal: Ability to maintain a balanced intake and output will improve Outcome: Completed/Met   Problem: Health Behavior/Discharge Planning: Goal: Ability to identify and utilize available resources and services will improve Outcome: Completed/Met Goal: Ability to manage health-related needs will improve Outcome: Completed/Met   Problem: Metabolic: Goal: Ability to maintain appropriate glucose levels will improve Outcome: Completed/Met   Problem:  Nutritional: Goal: Maintenance of adequate nutrition will improve Outcome: Completed/Met Goal: Progress toward achieving an optimal weight will improve Outcome: Completed/Met   Problem: Skin Integrity: Goal: Risk for impaired skin integrity will decrease Outcome: Completed/Met   Problem: Tissue Perfusion: Goal: Adequacy of tissue perfusion will improve Outcome: Completed/Met   Problem: Education: Goal: Knowledge of General Education information will improve Description: Including pain rating scale, medication(s)/side effects and non-pharmacologic comfort measures Outcome: Completed/Met   Problem: Health Behavior/Discharge Planning: Goal: Ability to manage health-related needs will improve Outcome: Completed/Met   Problem: Clinical Measurements: Goal: Ability to maintain clinical measurements within normal limits will improve Outcome: Completed/Met Goal: Will remain free from infection Outcome: Completed/Met Goal: Diagnostic test results will improve Outcome: Completed/Met Goal: Respiratory complications will improve Outcome: Completed/Met Goal: Cardiovascular complication will be avoided Outcome: Completed/Met   Problem: Activity: Goal: Risk for activity intolerance will decrease Outcome: Completed/Met   Problem: Nutrition: Goal: Adequate nutrition will be maintained Outcome: Completed/Met   Problem: Coping: Goal: Level of anxiety will decrease Outcome: Completed/Met   Problem: Elimination: Goal: Will not experience complications related to bowel motility Outcome: Completed/Met Goal: Will not experience complications related to urinary retention Outcome: Completed/Met   Problem: Pain Managment: Goal: General experience of comfort will improve Outcome: Completed/Met   Problem: Safety: Goal: Ability to remain free from injury will improve Outcome: Completed/Met   Problem: Skin Integrity: Goal: Risk for impaired skin integrity will decrease Outcome:  Completed/Met

## 2023-07-11 NOTE — Discharge Summary (Signed)
Physician Discharge Summary  Hannah Khan ZOX:[id number] DOB: November 13, 1955 DOA: 07/03/2023  PCP: Darrow Bussing, MD  Admit date: 07/03/2023 Discharge date: 07/11/2023  Admitted From: (Home) Disposition:  (Home )  Recommendations for Outpatient Follow-up:  Follow up with PCP in 1-2 weeks Please obtain CMP/CBC in one week   Diet recommendation: Heart Healthy   Brief/Interim Summary:  Hannah Khan is a 67 y.o. female with medical history significant of A-fib not on anticoagulation, CHF, hypertension, type 2 diabetes, CKD stage IIIb, Nash cirrhosis, gastroesophageal varices, chronic thrombocytopenia, anemia, abdominal wall cellulitis, severe morbid obesity. Recently admitted for TIPS creation on 06/30/2023 which was unsuccessful but they were able to perform balloon occlusion retrograde transvenous obliteration of the gastric varix. Postprocedure she developed hypotension requiring pressor support. Also developed A-fib with RVR treated with oral doses of metoprolol and Cardizem. Hospital course further complicated by acute blood loss anemia due to small to moderate volume hemoperitoneum. She was discharged from the hospital, patient presents with nausea, vomiting, hypotension, her workup significant for low hemoglobin at 6.3, as well thrombocytopenia (around baseline, she was noted to be in A-fib with RVR as well, hypotensive, so she was admitted for further workup.  Received multiple PRBC transfusion, multiple platelet units transfusion as well, has been followed closely by cardiology for assistance with her A-fib with RVR, hypotension and volume overload.   Acute blood loss anemia Hemoperitoneum -Baseline hemoglobin around 12, status post recent attempted TIPS procedures during recent hospitalizations, unsuccessful, with evidence of bleed/hemoperitoneum, hemoglobin down to 6.3 this admission. -Hemoglobin has been improving with transfusions, he required 4 units PRBC transfusion, globin is 9  on discharge   Hypotension -Multifactorial, in the setting of A-fib with RVR, anemia, liver cirrhosis -Required higher dose of midodrine during hospital stay, as well he received IV albumin, multiple blood product transfusion including platelets and PRBC, overall blood pressure has stabilized, at baseline, he will be discharged on home dose midodrine    Acute on chronic diastolic CHF -With evidence of volume overload, diuresis has been challenging given tachycardia and hypotension, but volume status much improved, she will be discharged on Aldactone, and torsemide  Cardiology medication recommendation at time of discharge >>spironolactone 25 mg daily, torsemide 80 mg daily (would monitor daily weights and take torsemide BID if gains more than 3 lbs in 1 day or 5 lbs in 1 week)     Decompensated NASH liver cirrhosis Coagulopathy Thrombocytopenia -With Aldactone, torsemide, lactulose at time of discharge, she received IV vitamin K as well, she did receive platelet transfusions, PRBC transfusion, volume status much improved, mentation at baseline.     Permanent A-fib with RVR -This is in the setting of hypotension, acute blood loss anemia -Difficult to control, cardiology input greatly appreciated, will need to address underlying causes including her anemia, hypertension and volume status, for which she required high-dose midodrine, multiple PRBC transfusions, platelet transfusion, and IV midodrine, blood pressure better controlled on increased dose metoprolol 25 mg p.o. twice daily, and increased dose digoxin given her digoxin level has been low.  -She required amiodarone drip initially for brief period of time, this has been discontinued, amiodarone is not cupped option for her given her liver disease-    Abdominal wall cellulitis/panniculitis -Initially on Rocephin>> Keflex, finished total 5 days.   Hypokalemia -Pleated, will increase Aldactone as well as she is on IV diuresis.   Acute  respiratory failure with hypoxia -Remains on 2 to 3 L nasal cannula. -Bilateral pleural effusion, vascular congestion, hopefully should  improve once more stable for diuresis -Encouraged use incentive spirometry and flutter valve -VQ scan negative for PE -Has resolved, no oxygen requirement with activity with OT yesterday,   Elevated troponin Demand ischemia due to anemia, hypertension, non-ACS pattern, EKG nonacute.   AKI on CKD stage IIIb -Her baseline creatinine around 1.4-1.5, it is 2.03 this admission, she is high risk for hepatorenal syndrome, as well IV fluid is challenging in the setting of her volume status, so she has been managed with IV albumin, and PRBC transfusion,  and midodrine -Renal function back to baseline on discharge   Type 2 diabetes Glucose 205.  Last A1c 4.7 in February 2024, his admission A1c is 5.2    Discharge Diagnoses:  Principal Problem:   Nausea and vomiting Active Problems:   Anemia   Cellulitis, abdominal wall   Chronic heart failure with preserved ejection fraction (HFpEF) (HCC)   Thrombocytopenia (HCC)   Type 2 diabetes mellitus (HCC)   Atrial fibrillation with RVR (HCC)   Liver cirrhosis secondary to NASH (HCC)   Acute on chronic diastolic heart failure (HCC)   Chronic hypotension   Hypoxia   Nausea & vomiting    Discharge Instructions  Discharge Instructions     Diet - low sodium heart healthy   Complete by: As directed    Discharge instructions   Complete by: As directed    Follow with Primary MD Koirala, Dibas, MD in 7 days   Get CBC, CMP,  checked  by Primary MD next visit.    Activity: As tolerated with Full fall precautions use walker/cane & assistance as needed   Disposition Home    Diet: Heart Healthy /low-sodium, with 1500 cc fluid restriction per day.  On your next visit with your primary care physician please Get Medicines reviewed and adjusted.   Please request your Prim.MD to go over all Hospital Tests and  Procedure/Radiological results at the follow up, please get all Hospital records sent to your Prim MD by signing hospital release before you go home.   If you experience worsening of your admission symptoms, develop shortness of breath, life threatening emergency, suicidal or homicidal thoughts you must seek medical attention immediately by calling 911 or calling your MD immediately  if symptoms less severe.  You Must read complete instructions/literature along with all the possible adverse reactions/side effects for all the Medicines you take and that have been prescribed to you. Take any new Medicines after you have completely understood and accpet all the possible adverse reactions/side effects.   Do not drive, operating heavy machinery, perform activities at heights, swimming or participation in water activities or provide baby sitting services if your were admitted for syncope or siezures until you have seen by Primary MD or a Neurologist and advised to do so again.  Do not drive when taking Pain medications.    Do not take more than prescribed Pain, Sleep and Anxiety Medications  Special Instructions: If you have smoked or chewed Tobacco  in the last 2 yrs please stop smoking, stop any regular Alcohol  and or any Recreational drug use.  Wear Seat belts while driving.   Please note  You were cared for by a hospitalist during your hospital stay. If you have any questions about your discharge medications or the care you received while you were in the hospital after you are discharged, you can call the unit and asked to speak with the hospitalist on call if the hospitalist that took care of you  is not available. Once you are discharged, your primary care physician will handle any further medical issues. Please note that NO REFILLS for any discharge medications will be authorized once you are discharged, as it is imperative that you return to your primary care physician (or establish a  relationship with a primary care physician if you do not have one) for your aftercare needs so that they can reassess your need for medications and monitor your lab values.   Increase activity slowly   Complete by: As directed       Allergies as of 07/11/2023       Reactions   Celebrex [celecoxib] Other (See Comments)   "speech slurred" with celebrex   Shellfish Allergy Swelling, Rash   TINGLING AND SWELLING OF LIPS. LARGE WHELPS QUARTER-SIZE   Barium-containing Compounds Other (See Comments)   TACHYCARDIA   Prednisone Other (See Comments)   TACHYCARDIA   Statins Other (See Comments)   AGGRAVATED FIBROMYALGIA   Fentanyl    Hallucinations    Ibuprofen Other (See Comments)   UNSPECIFIED SPECIFIC REACTION >> "DUE TO PLATELETS"   Sglt2 Inhibitors    Ozempic Severe gastroparesis    Tramadol    Hallucination   Tylenol [acetaminophen] Other (See Comments)   UNSPECIFIED SPECIFIC REACTION >> "DUE TO PLATELETS"   Zetia [ezetimibe]    Headache        Medication List     STOP taking these medications    diltiazem 180 MG 24 hr capsule Commonly known as: CARDIZEM CD   doxycycline 100 MG tablet Commonly known as: ADOXA   metolazone 5 MG tablet Commonly known as: ZAROXOLYN   ondansetron 4 MG tablet Commonly known as: Zofran   oxycodone 5 MG capsule Commonly known as: OXY-IR       TAKE these medications    albuterol 108 (90 Base) MCG/ACT inhaler Commonly known as: VENTOLIN HFA Inhale 1-2 puffs into the lungs every 6 (six) hours as needed for wheezing or shortness of breath.   digoxin 0.25 MG tablet Commonly known as: LANOXIN Take 1 tablet (0.25 mg total) by mouth daily. Start taking on: July 12, 2023 What changed:  medication strength how much to take   lactulose 10 GM/15ML solution Commonly known as: CHRONULAC Take 20 g by mouth 3 (three) times daily.   metoprolol tartrate 25 MG tablet Commonly known as: LOPRESSOR Take 1 tablet (25 mg total) by  mouth 2 (two) times daily. What changed: how much to take   midodrine 10 MG tablet Commonly known as: PROAMATINE Take 1 tablet (10 mg total) by mouth 3 (three) times daily.   OneTouch Delica Plus Lancet33G Misc Apply topically 3 (three) times daily.   OneTouch Verio test strip Generic drug: glucose blood 1 each 3 (three) times daily.   potassium chloride 10 MEQ CR capsule Commonly known as: MICRO-K Take 10 capsules (100 mEq total) by mouth 2 (two) times daily. Take an extra 4 tablets when taking Metolazone.   PreserVision AREDS 2 Caps Take 1 capsule by mouth 2 (two) times daily.   Salonpas Lidocaine Plus 4-10 % Crea Generic drug: Lidocaine HCl-Benzyl Alcohol Apply 1 Application topically daily as needed (knee pain).   spironolactone 25 MG tablet Commonly known as: ALDACTONE Take 1 tablet (25 mg total) by mouth daily. Start taking on: July 12, 2023   Systane Balance 0.6 % Soln Generic drug: Propylene Glycol Place 1 drop into both eyes 2 (two) times daily.   torsemide 20 MG tablet Commonly known as:  DEMADEX Torsemide 80 mg daily ( monitor daily weights and take torsemide BID if gains more than 3 lbs in 1 day or 5 lbs in 1 week) What changed: See the new instructions.   Xifaxan 550 MG Tabs tablet Generic drug: rifaximin Take 550 mg by mouth 2 (two) times daily.        Follow-up Information     [facility] Follow up.   Specialty: Rehabilitation Why: Please call to schedule your outpatient therapy appoinment Contact information: [address] [place] [address] [phone]               Allergies  Allergen Reactions   Celebrex [Celecoxib] Other (See Comments)    "speech slurred" with celebrex   Shellfish Allergy Swelling and Rash    TINGLING AND SWELLING OF LIPS. LARGE WHELPS QUARTER-SIZE   Barium-Containing Compounds Other (See Comments)    TACHYCARDIA   Prednisone Other (See  Comments)    TACHYCARDIA   Statins Other (See Comments)    AGGRAVATED FIBROMYALGIA   Fentanyl     Hallucinations    Ibuprofen Other (See Comments)    UNSPECIFIED SPECIFIC REACTION >> "DUE TO PLATELETS"   Sglt2 Inhibitors     Ozempic Severe gastroparesis    Tramadol     Hallucination    Tylenol [Acetaminophen] Other (See Comments)    UNSPECIFIED SPECIFIC REACTION >> "DUE TO PLATELETS"   Zetia [Ezetimibe]     Headache    Consultations: cardiology   Procedures/Studies: DG Chest Port 1 View  Result Date: 07/08/2023 CLINICAL DATA:  Hypoxia EXAM: PORTABLE CHEST 1 VIEW COMPARISON:  07/06/2023 FINDINGS: Right PICC line remains in place. Stable cardiomegaly. Progressive bilateral airspace opacities including a more confluent right upper lobe opacity. Probable small bilateral pleural effusions. IMPRESSION: 1. Progressive bilateral airspace opacities including a more confluent right upper lobe opacity. Findings may reflect pulmonary edema or multifocal pneumonia. 2. Probable small bilateral pleural effusions. Electronically Signed   By: Duanne Guess D.O.   On: 07/08/2023 16:38   DG Chest Port 1 View  Result Date: 07/06/2023 CLINICAL DATA:  PICC line placement. EXAM: PORTABLE CHEST 1 VIEW COMPARISON:  07/03/2023. FINDINGS: Heart is enlarged and the mediastinal contour is within normal limits. There is atherosclerotic calcification of the aorta. The pulmonary vasculature is distended. Interstitial prominence is present bilaterally. A stable opacity is present at the left lung base. No pneumothorax. A right PICC line terminates over the superior vena cava. No acute osseous abnormality. IMPRESSION: 1. Right PICC line terminates over the superior vena cava. 2. Otherwise stable chest. Electronically Signed   By: Thornell Sartorius M.D.   On: 07/06/2023 19:56   DG Hand 2 View Left  Result Date: 07/06/2023 CLINICAL DATA:  Posterior hand pain EXAM: LEFT HAND - 2 VIEW COMPARISON:  None Available.  FINDINGS: No fracture or malalignment. Joint space narrowing and arthritis at the IP joints. Dorsal soft tissue swelling without foreign body or emphysema IMPRESSION: Dorsal soft tissue swelling. No acute osseous abnormality. Degenerative changes. Electronically Signed   By: Jasmine Pang M.D.   On: 07/06/2023 19:08   [place] EKG SITE RITE  Result Date: 07/06/2023 If Site Rite image not attached, placement could not be confirmed due to current cardiac rhythm.  NM Pulmonary Perfusion  Result Date: 07/04/2023 CLINICAL DATA:  CHF, shortness of breath and chest pain. EXAM: NUCLEAR MEDICINE PERFUSION LUNG SCAN TECHNIQUE: Perfusion images were obtained in multiple projections after intravenous injection of  radiopharmaceutical. Ventilation scans intentionally deferred if perfusion scan and chest x-ray adequate for interpretation during COVID 19 epidemic. RADIOPHARMACEUTICALS:  4.4 mCi Tc-42m MAA IV COMPARISON:  Chest x-ray 07/03/2019 FINDINGS: Lateral projections are limited due to patient having difficulty tolerating the procedure. Heart is enlarged with elevation of the left hemidiaphragm. On x-ray there is also some left lung base opacity. Perfusion defect at the left lung base is corresponding to the area of the lung opacity. A few small subsegmental defects elsewhere. IMPRESSION: Conglomeration of findings consistent with a low probability perfusion lung scan. Electronically Signed   By: Karen Kays M.D.   On: 07/04/2023 16:07   DG Chest Port 1 View  Result Date: 07/04/2023 CLINICAL DATA:  Possible hypoxia EXAM: PORTABLE CHEST 1 VIEW COMPARISON:  07/01/2023 FINDINGS: Cardiomegaly with vascular congestion and diffuse increased interstitial opacity suspicious for edema. Suspected airspace disease at the left base. Aortic atherosclerosis. IMPRESSION: Cardiomegaly with vascular congestion and interstitial edema increased compared to prior radiograph. Difficult to exclude left effusion or left basilar airspace  disease Electronically Signed   By: Jasmine Pang M.D.   On: 07/04/2023 00:08   CT ABDOMEN PELVIS WO CONTRAST  Result Date: 07/03/2023 CLINICAL DATA:  Abdominal pain. Generalized weakness and hypotension. Hemoperitoneum EXAM: CT ABDOMEN AND PELVIS WITHOUT CONTRAST TECHNIQUE: Multidetector CT imaging of the abdomen and pelvis was performed following the standard protocol without IV contrast. RADIATION DOSE REDUCTION: This exam was performed according to the departmental dose-optimization program which includes automated exposure control, adjustment of the mA and/or kV according to patient size and/or use of iterative reconstruction technique. COMPARISON:  CT abdomen and pelvis without contrast 07/01/23 FINDINGS: Lower chest: Left greater than right pleural effusions are stable. Mild atelectasis is present at the left base. The heart is enlarged. Blood chambers are hypodense, consistent with anemia. Hepatobiliary: Nodular irregularity is present within the liver. No discrete lesions are present. Hemoperitoneum along the anterior margin of the liver is decreased in size compared to the prior exam. Cholecystectomy noted. No significant intra or extrahepatic biliary dilation is present. Pancreas: Unremarkable. No pancreatic ductal dilatation or surrounding inflammatory changes. Spleen: Splenomegaly again noted. Adrenals/Urinary Tract: The adrenal glands are normal bilaterally. Bilateral simple cysts are stable. The ureters are within normal limits. The urinary bladder is mostly collapsed. Stomach/Bowel: The stomach and duodenum are within normal limits. Small bowel is unremarkable. Terminal ileum is normal. Appendectomy noted. Ascending and transverse colon within normal limits. Descending and sigmoid colon are normal. Vascular/Lymphatic: Scattered atherosclerotic calcifications are present within the aorta and branch vessels. No aneurysm is present. Cavernous transformation of the portal vein is again noted.  Aneurysm coils are present. No significant adenopathy is present. Reproductive: IUD remains. Uterus and adnexa are otherwise within normal limits. Other: Hemoperitoneum extending into the pelvis is decreased compared to the prior exam. No new hemorrhage is present. Diffuse subcutaneous edema is present. No free air is present. Musculoskeletal: Mild degenerative changes are again noted in the thoracolumbar spine. No focal osseous lesions are present. Bony pelvis is normal. The hips are located and within normal limits bilaterally. IMPRESSION: 1. Decreased hemoperitoneum compared to the prior exam. 2. Stable left greater than right pleural effusions. 3. Stable cardiomegaly. 4. Anemia. 5. Stable splenomegaly. 6. Stable simple cysts of both kidneys. 7. Cavernous transformation of the portal vein. 8.  Aortic Atherosclerosis (ICD10-I70.0). Electronically Signed   By: Marin Roberts M.D.   On: 07/03/2023 21:59   CT ABDOMEN PELVIS WO CONTRAST  Result Date: 07/01/2023 CLINICAL DATA:  Postop abdominal pain. EXAM: CT ABDOMEN AND PELVIS WITHOUT CONTRAST TECHNIQUE: Multidetector CT imaging of the abdomen and pelvis was performed following the standard protocol without IV contrast. RADIATION DOSE REDUCTION: This exam was performed according to the departmental dose-optimization program which includes automated exposure control, adjustment of the mA and/or kV according to patient size and/or use of iterative reconstruction technique. COMPARISON:  04/06/2023 FINDINGS: Lower chest: Small left effusion. Hepatobiliary: Morphologic features of the liver compatible with cirrhosis identified. This is similar to the previous exam. Isodense fluid extending over the right lobe of liver is identified. Status post cholecystectomy. Unchanged increase caliber of the CBD. Pancreas: Unremarkable. No pancreatic ductal dilatation or surrounding inflammatory changes. Spleen: Splenomegaly, unchanged. There is a small amount of isodense  fluid which extends around the posterior and inferior spleen, image 47/4. Adrenals/Urinary Tract: Normal appearance of the adrenal glands. Bilateral kidney cysts are stable compared with the previous X study. No follow-up imaging recommended. No signs of obstructive uropathy. Urinary bladder is partially decompressed containing contrast material from previous imaging. Stomach/Bowel: Stomach appears normal. No pathologic dilatation of the large or small bowel loops. Signs of previous right hemicolectomy. No bowel wall thickening or inflammation. Vascular/Lymphatic: Aortic atherosclerosis. No aneurysm. Edema identified within the small bowel mesentery. Nonspecific in the setting of cirrhosis with portal venous hypertension. Similar appearance of esophageal and gastric varices. Increased by collateral vessel formation within the porta hepatic region compatible with chronic portal vein occlusion with cavernous transformation. No signs of abdominopelvic adenopathy. Reproductive: IUD identified within the uterus. There is no adnexal mass. Other: Intermediate density fluid is noted extending along bilateral pericolic gutters to the peritoneal reflections of the lower abdomen/upper pelvis. Measured in the left lower quadrant of the abdomen fluid measures 39 Hounsfield units. I would consider this a small to moderate volume of hemoperitoneum. No focal fluid collections identified. Visualized osseous structures are unremarkable. Musculoskeletal: No acute or significant osseous findings. IMPRESSION: 1. Interval development of small to moderate volume of hemoperitoneum within the abdomen and pelvis. This extends over the liver, spleen, along the pericolic gutters and peritoneal reflections of the lower abdomen and pelvis. 2. Cirrhosis with portal venous hypertension. Similar appearance of esophageal and gastric varices. 3. Chronic portal vein occlusion with cavernous transformation. 4. Small left pleural effusion. 5.  Aortic  Atherosclerosis (ICD10-I70.0). Critical Value/emergent results were called by telephone at the time of interpretation on 07/01/2023 at 11:12 am to provider [facility] , who verbally acknowledged these results. Electronically Signed   By: Signa Kell M.D.   On: 07/01/2023 11:14   DG Chest 1 View  Result Date: 07/01/2023 CLINICAL DATA:  Shortness of breath. EXAM: CHEST  1 VIEW COMPARISON:  One-view chest x-ray 03/09/2023 FINDINGS: Heart is enlarged. Mild pulmonary vascular congestion is present without frank edema. No focal airspace disease is present. The visualized soft tissues and bony thorax are unremarkable. IMPRESSION: Cardiomegaly and mild pulmonary vascular congestion without frank edema. Electronically Signed   By: Marin Roberts M.D.   On: 07/01/2023 10:10   IR Tips  Result Date: 06/30/2023 CLINICAL DATA:  67 year old female with history of NASH cirrhosis (Child Pugh B, MELD 20), portal hypertension, and gastroesophageal varices without history of hemorrhage. 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 She has very large gastroesophageal varices. 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 EXAM: 1. Ultrasound guided vascular  access of the right basilic vein 2. Peripherally inserted central catheter placement 3. Ultrasound-guided access of the right internal jugular vein 4. Ultrasound-guided access of the right greater saphenous vein 5. Hepatic venogram 6. Intravascular ultrasound 7. Central venous manometery 8. Coil assisted retrograde transvenous obliteration of gastrorenal varices MEDICATIONS: As antibiotic prophylaxis, Rocephin 2 gm IV was ordered pre-procedure and administered intravenously within one hour of incision. ANESTHESIA/SEDATION: General - as administered by the Anesthesia  department CONTRAST:  One hundred sixty ML Omnipaque 300, intravenous FLUOROSCOPY TIME:  Fluoroscopy Time: 40 minutes (2,117 mGy). COMPLICATIONS: None immediate. PROCEDURE: Informed written consent was obtained from the patient after a thorough discussion of the procedural risks, benefits and alternatives. All questions were addressed. Maximal Sterile Barrier Technique was utilized including caps, mask, sterile gowns, sterile gloves, sterile drape, hand hygiene and skin antiseptic. A timeout was performed prior to the initiation of the procedure. Preprocedure ultrasound evaluation of the right upper extremity demonstrated a patent basilic vein. The procedure was planned. Subdermal Local anesthesia was provided at the planned needle entry site with 1% lidocaine. A small skin nick was made. Under direct ultrasound visualization, the right basilic vein was punctured with a 21 gauge micropuncture needle. A permanent ultrasound image was captured and stored in the record. A guidewire was inserted over which a 5 French peel-away sheath was placed. The wire and inner portion of the sheath were removed and a 38 cm, dual lumen 5 French peripherally inserted central catheter were then placed. Catheter tip terminates near the cavoatrial junction. Both lumens flushed and aspirated with ease. A sterile bandage was applied. This venous access was used by the department of anesthesia throughout the procedure. A preliminary ultrasound of the right groin was performed and demonstrates a patent right common femoral vein. A permanent ultrasound image was recorded. Using a combination of fluoroscopy and ultrasound, an access site was determined. A small dermatotomy was made at the planned puncture site. Using ultrasound guidance, access into the right common femoral vein was obtained with visualization of needle entry into the vessel using a standard micropuncture technique. A wire was advanced into the IVC insert all fascial dilation  performed. An 8 [place], 11 cm vascular sheath was placed into the external iliac vein. Through this access site, an 33 [place] ICE catheter was advanced with ease under fluoroscopic guidance to the level of the intrahepatic inferior vena cava. A preliminary ultrasound of the right neck was performed and demonstrates a patent internal jugular vein. A permanent ultrasound image was recorded. Using a combination of fluoroscopy and ultrasound, an access site was determined. A small dermatotomy was made at the planned puncture site. Using ultrasound guidance, access into the right internal jugular vein was obtained with visualization of needle entry into the vessel using a standard micropuncture technique. A wire was advanced into the IVC and serial fascial dilation performed. A 10 French tips sheath was placed into the internal jugular vein and advanced to the IVC. The jugular sheath was retracted into the right atrium and manometry was performed and measured to be a mean of 14 mm Hg. A 5 French angled tip catheter was then directed into the left renal vein. Left renal venogram was performed which demonstrated patency and patent inflow from a large gastro renal shunt. The gastro renal shunt was then selected. A balloon occlusion catheter was inserted and inflated to occlude the outflow. Dedicated variceal venogram was then performed which demonstrated an isolated gastric varix with no significant evidence of venous  outflow to the nearby esophageal varices or portal venous system. Transvenous obliteration was then performed with approximately 6 mL of a solution of Gel-Foam slurry, air, 3% sotradecol, and lipiodol. The occlusion balloon catheter was then flushed and coil embolization was performed at the inferior aspect of the gastro renal shunt with an assortment of 0.035" detachable Azur coils. The Fogarty catheter was then deflated and removed. Repeat left renal venogram demonstrated no evidence of reflux into  the occluded gastro renal shunt. A 5 French angled tip catheter was then directed into the middle hepatic vein. Hepatic venogram was performed. These images demonstrated patent hepatic vein with no stenosis. The catheter was advanced to a wedge portion of the a patent vein over which the 10 French sheath was advanced into the middle hepatic vein. Using ICE ultrasound visualization the catheter as middle hepatic vein as well as the portal anatomy was defined. A planned exit site from the hepatic vein and puncture site from the suspected portal vein was placed into a single sonographic plane. Under direct ultrasound visualization, the ScorpionX needle was advanced into the suspected occluded portal vein. This was unsuccessful in cannulation. The needle set was removed. The left flank was then prepped and draped in standard fashion. Ultrasound demonstrated an enlarged spleen with a patent straight appearing vein near the splenic hilum which appeared appropriate for puncture. The under direct ultrasound visualization, the splenic vein was attempted to be cannulated with a 21 gauge Chiba needle making a proximally 6 separate passes, ultimately failing to gain access to the splenic vein. Completion ultrasound demonstrated no evidence of surrounding hematoma or other complicating features. Given failure of attempted trans splenic portal venous recanalization, additional attempt at transjugular transhepatic portosystemic shunt creation was attempted with ICE in fluoroscopic guidance however from the right portal vein. A single pass was attempted at what appears to be in a track portal target in the right lobe of the liver, however there is inability to advance a wire into the structure. The needle set was removed once again. Next, attempted cannulating the large SMV to IVC ectopic varix in the right lower quadrant was attempted. Balloon occlusion venogram was performed which demonstrated primarily right gonadal vein  opacification. There is no evidence of portal opacification for possible image guided targeting. At this point, the procedure was terminated. The catheters and sheath were removed and manual compression was applied to the right internal jugular and right greater saphenous venous access sites until hemostasis was achieved. The patient was transferred to the PACU in stable condition. IMPRESSION: 1. Technically successful coil assisted retrograde transvenous obliteration of isolated large gastrorenal variceal shunt. 2. Technically unsuccessful transjugular intrahepatic portosystemic shunt creation and attempted transsplenic portal vein recanalization. 3. Technically successful right upper extremity image guided peripherally inserted central catheter placement. Marliss Coots, MD [facility] Electronically Signed   By: Marliss Coots M.D.   On: 06/30/2023 21:46   IR PICC PLACEMENT LEFT <5 YRS INC IMG GUIDE  Result Date: 06/30/2023 CLINICAL DATA:  67 year old female with history of NASH cirrhosis (Child Pugh B, MELD 20), portal hypertension, and gastroesophageal varices without history of hemorrhage. 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 She has very large gastroesophageal varices. 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 EXAM: 1. Ultrasound guided vascular access of the right basilic vein 2. Peripherally inserted  central catheter placement 3. Ultrasound-guided access of the right internal jugular vein 4. Ultrasound-guided access of the right greater saphenous vein 5. Hepatic venogram 6. Intravascular ultrasound 7. Central venous manometery 8. Coil assisted retrograde transvenous obliteration of gastrorenal varices MEDICATIONS: As antibiotic prophylaxis,  Rocephin 2 gm IV was ordered pre-procedure and administered intravenously within one hour of incision. ANESTHESIA/SEDATION: General - as administered by the Anesthesia department CONTRAST:  One hundred sixty ML Omnipaque 300, intravenous FLUOROSCOPY TIME:  Fluoroscopy Time: 40 minutes (2,117 mGy). COMPLICATIONS: None immediate. PROCEDURE: Informed written consent was obtained from the patient after a thorough discussion of the procedural risks, benefits and alternatives. All questions were addressed. Maximal Sterile Barrier Technique was utilized including caps, mask, sterile gowns, sterile gloves, sterile drape, hand hygiene and skin antiseptic. A timeout was performed prior to the initiation of the procedure. Preprocedure ultrasound evaluation of the right upper extremity demonstrated a patent basilic vein. The procedure was planned. Subdermal Local anesthesia was provided at the planned needle entry site with 1% lidocaine. A small skin nick was made. Under direct ultrasound visualization, the right basilic vein was punctured with a 21 gauge micropuncture needle. A permanent ultrasound image was captured and stored in the record. A guidewire was inserted over which a 5 French peel-away sheath was placed. The wire and inner portion of the sheath were removed and a 38 cm, dual lumen 5 French peripherally inserted central catheter were then placed. Catheter tip terminates near the cavoatrial junction. Both lumens flushed and aspirated with ease. A sterile bandage was applied. This venous access was used by the department of anesthesia throughout the procedure. A preliminary ultrasound of the right groin was performed and demonstrates a patent right common femoral vein. A permanent ultrasound image was recorded. Using a combination of fluoroscopy and ultrasound, an access site was determined. A small dermatotomy was made at the planned puncture site. Using ultrasound guidance, access into the right common femoral vein  was obtained with visualization of needle entry into the vessel using a standard micropuncture technique. A wire was advanced into the IVC insert all fascial dilation performed. An 8 [place], 11 cm vascular sheath was placed into the external iliac vein. Through this access site, an 46 [place] ICE catheter was advanced with ease under fluoroscopic guidance to the level of the intrahepatic inferior vena cava. A preliminary ultrasound of the right neck was performed and demonstrates a patent internal jugular vein. A permanent ultrasound image was recorded. Using a combination of fluoroscopy and ultrasound, an access site was determined. A small dermatotomy was made at the planned puncture site. Using ultrasound guidance, access into the right internal jugular vein was obtained with visualization of needle entry into the vessel using a standard micropuncture technique. A wire was advanced into the IVC and serial fascial dilation performed. A 10 French tips sheath was placed into the internal jugular vein and advanced to the IVC. The jugular sheath was retracted into the right atrium and manometry was performed and measured to be a mean of 14 mm Hg. A 5 French angled tip catheter was then directed into the left renal vein. Left renal venogram was performed which demonstrated patency and patent inflow from a large gastro renal shunt. The gastro renal shunt was then selected. A balloon occlusion catheter was inserted and inflated to occlude the outflow. Dedicated variceal venogram was then performed which demonstrated an isolated gastric varix with no significant evidence of venous outflow to the nearby esophageal varices or portal venous  system. Transvenous obliteration was then performed with approximately 6 mL of a solution of Gel-Foam slurry, air, 3% sotradecol, and lipiodol. The occlusion balloon catheter was then flushed and coil embolization was performed at the inferior aspect of the gastro renal shunt with  an assortment of 0.035" detachable Azur coils. The Fogarty catheter was then deflated and removed. Repeat left renal venogram demonstrated no evidence of reflux into the occluded gastro renal shunt. A 5 French angled tip catheter was then directed into the middle hepatic vein. Hepatic venogram was performed. These images demonstrated patent hepatic vein with no stenosis. The catheter was advanced to a wedge portion of the a patent vein over which the 10 French sheath was advanced into the middle hepatic vein. Using ICE ultrasound visualization the catheter as middle hepatic vein as well as the portal anatomy was defined. A planned exit site from the hepatic vein and puncture site from the suspected portal vein was placed into a single sonographic plane. Under direct ultrasound visualization, the ScorpionX needle was advanced into the suspected occluded portal vein. This was unsuccessful in cannulation. The needle set was removed. The left flank was then prepped and draped in standard fashion. Ultrasound demonstrated an enlarged spleen with a patent straight appearing vein near the splenic hilum which appeared appropriate for puncture. The under direct ultrasound visualization, the splenic vein was attempted to be cannulated with a 21 gauge Chiba needle making a proximally 6 separate passes, ultimately failing to gain access to the splenic vein. Completion ultrasound demonstrated no evidence of surrounding hematoma or other complicating features. Given failure of attempted trans splenic portal venous recanalization, additional attempt at transjugular transhepatic portosystemic shunt creation was attempted with ICE in fluoroscopic guidance however from the right portal vein. A single pass was attempted at what appears to be in a track portal target in the right lobe of the liver, however there is inability to advance a wire into the structure. The needle set was removed once again. Next, attempted cannulating the  large SMV to IVC ectopic varix in the right lower quadrant was attempted. Balloon occlusion venogram was performed which demonstrated primarily right gonadal vein opacification. There is no evidence of portal opacification for possible image guided targeting. At this point, the procedure was terminated. The catheters and sheath were removed and manual compression was applied to the right internal jugular and right greater saphenous venous access sites until hemostasis was achieved. The patient was transferred to the PACU in stable condition. IMPRESSION: 1. Technically successful coil assisted retrograde transvenous obliteration of isolated large gastrorenal variceal shunt. 2. Technically unsuccessful transjugular intrahepatic portosystemic shunt creation and attempted transsplenic portal vein recanalization. 3. Technically successful right upper extremity image guided peripherally inserted central catheter placement. Marliss Coots, MD [facility] Electronically Signed   By: Marliss Coots M.D.   On: 06/30/2023 21:46   IR US Guide Vasc Access Right  Result Date: 06/30/2023 CLINICAL DATA:  67 year old female with history of NASH cirrhosis (Child Pugh B, MELD 20), portal hypertension, and gastroesophageal varices without history of hemorrhage. 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 She has very large gastroesophageal varices. 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 EXAM: 1. Ultrasound guided vascular access of the right basilic vein 2. Peripherally inserted central catheter placement 3. Ultrasound-guided access of the right internal jugular vein  4. Ultrasound-guided access of the right greater saphenous vein 5. Hepatic venogram 6. Intravascular  ultrasound 7. Central venous manometery 8. Coil assisted retrograde transvenous obliteration of gastrorenal varices MEDICATIONS: As antibiotic prophylaxis, Rocephin 2 gm IV was ordered pre-procedure and administered intravenously within one hour of incision. ANESTHESIA/SEDATION: General - as administered by the Anesthesia department CONTRAST:  One hundred sixty ML Omnipaque 300, intravenous FLUOROSCOPY TIME:  Fluoroscopy Time: 40 minutes (2,117 mGy). COMPLICATIONS: None immediate. PROCEDURE: Informed written consent was obtained from the patient after a thorough discussion of the procedural risks, benefits and alternatives. All questions were addressed. Maximal Sterile Barrier Technique was utilized including caps, mask, sterile gowns, sterile gloves, sterile drape, hand hygiene and skin antiseptic. A timeout was performed prior to the initiation of the procedure. Preprocedure ultrasound evaluation of the right upper extremity demonstrated a patent basilic vein. The procedure was planned. Subdermal Local anesthesia was provided at the planned needle entry site with 1% lidocaine. A small skin nick was made. Under direct ultrasound visualization, the right basilic vein was punctured with a 21 gauge micropuncture needle. A permanent ultrasound image was captured and stored in the record. A guidewire was inserted over which a 5 French peel-away sheath was placed. The wire and inner portion of the sheath were removed and a 38 cm, dual lumen 5 French peripherally inserted central catheter were then placed. Catheter tip terminates near the cavoatrial junction. Both lumens flushed and aspirated with ease. A sterile bandage was applied. This venous access was used by the department of anesthesia throughout the procedure. A preliminary ultrasound of the right groin was performed and demonstrates a patent right common femoral vein. A permanent ultrasound image was recorded. Using a combination of fluoroscopy and ultrasound, an  access site was determined. A small dermatotomy was made at the planned puncture site. Using ultrasound guidance, access into the right common femoral vein was obtained with visualization of needle entry into the vessel using a standard micropuncture technique. A wire was advanced into the IVC insert all fascial dilation performed. An 8 [place], 11 cm vascular sheath was placed into the external iliac vein. Through this access site, an 22 [place] ICE catheter was advanced with ease under fluoroscopic guidance to the level of the intrahepatic inferior vena cava. A preliminary ultrasound of the right neck was performed and demonstrates a patent internal jugular vein. A permanent ultrasound image was recorded. Using a combination of fluoroscopy and ultrasound, an access site was determined. A small dermatotomy was made at the planned puncture site. Using ultrasound guidance, access into the right internal jugular vein was obtained with visualization of needle entry into the vessel using a standard micropuncture technique. A wire was advanced into the IVC and serial fascial dilation performed. A 10 French tips sheath was placed into the internal jugular vein and advanced to the IVC. The jugular sheath was retracted into the right atrium and manometry was performed and measured to be a mean of 14 mm Hg. A 5 French angled tip catheter was then directed into the left renal vein. Left renal venogram was performed which demonstrated patency and patent inflow from a large gastro renal shunt. The gastro renal shunt was then selected. A balloon occlusion catheter was inserted and inflated to occlude the outflow. Dedicated variceal venogram was then performed which demonstrated an isolated gastric varix with no significant evidence of venous outflow to the nearby esophageal varices or portal venous system. Transvenous obliteration was then performed with approximately 6 mL of a  solution of Gel-Foam slurry, air, 3%  sotradecol, and lipiodol. The occlusion balloon catheter was then flushed and coil embolization was performed at the inferior aspect of the gastro renal shunt with an assortment of 0.035" detachable Azur coils. The Fogarty catheter was then deflated and removed. Repeat left renal venogram demonstrated no evidence of reflux into the occluded gastro renal shunt. A 5 French angled tip catheter was then directed into the middle hepatic vein. Hepatic venogram was performed. These images demonstrated patent hepatic vein with no stenosis. The catheter was advanced to a wedge portion of the a patent vein over which the 10 French sheath was advanced into the middle hepatic vein. Using ICE ultrasound visualization the catheter as middle hepatic vein as well as the portal anatomy was defined. A planned exit site from the hepatic vein and puncture site from the suspected portal vein was placed into a single sonographic plane. Under direct ultrasound visualization, the ScorpionX needle was advanced into the suspected occluded portal vein. This was unsuccessful in cannulation. The needle set was removed. The left flank was then prepped and draped in standard fashion. Ultrasound demonstrated an enlarged spleen with a patent straight appearing vein near the splenic hilum which appeared appropriate for puncture. The under direct ultrasound visualization, the splenic vein was attempted to be cannulated with a 21 gauge Chiba needle making a proximally 6 separate passes, ultimately failing to gain access to the splenic vein. Completion ultrasound demonstrated no evidence of surrounding hematoma or other complicating features. Given failure of attempted trans splenic portal venous recanalization, additional attempt at transjugular transhepatic portosystemic shunt creation was attempted with ICE in fluoroscopic guidance however from the right portal vein. A single pass was attempted at what appears to be in a track portal target in the  right lobe of the liver, however there is inability to advance a wire into the structure. The needle set was removed once again. Next, attempted cannulating the large SMV to IVC ectopic varix in the right lower quadrant was attempted. Balloon occlusion venogram was performed which demonstrated primarily right gonadal vein opacification. There is no evidence of portal opacification for possible image guided targeting. At this point, the procedure was terminated. The catheters and sheath were removed and manual compression was applied to the right internal jugular and right greater saphenous venous access sites until hemostasis was achieved. The patient was transferred to the PACU in stable condition. IMPRESSION: 1. Technically successful coil assisted retrograde transvenous obliteration of isolated large gastrorenal variceal shunt. 2. Technically unsuccessful transjugular intrahepatic portosystemic shunt creation and attempted transsplenic portal vein recanalization. 3. Technically successful right upper extremity image guided peripherally inserted central catheter placement. Marliss Coots, MD [facility] Radiology Electronically Signed   By: Marliss Coots M.D.   On: 06/30/2023 21:46   IR US Guide Vasc Access Right  Result Date: 06/30/2023 CLINICAL DATA:  67 year old female with history of NASH cirrhosis (Child Pugh B, MELD 20), portal hypertension, and gastroesophageal varices without history of hemorrhage. 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 She has very large gastroesophageal varices. 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 EXAM: 1. Ultrasound guided vascular access of the right basilic vein 2. Peripherally inserted central catheter placement 3.  Ultrasound-guided access of the right internal jugular vein 4. Ultrasound-guided access of the right greater saphenous vein 5. Hepatic  venogram 6. Intravascular ultrasound 7. Central venous manometery 8. Coil assisted retrograde transvenous obliteration of gastrorenal varices MEDICATIONS: As antibiotic prophylaxis, Rocephin 2 gm IV was ordered pre-procedure and administered intravenously within one hour of incision. ANESTHESIA/SEDATION: General - as administered by the Anesthesia department CONTRAST:  One hundred sixty ML Omnipaque 300, intravenous FLUOROSCOPY TIME:  Fluoroscopy Time: 40 minutes (2,117 mGy). COMPLICATIONS: None immediate. PROCEDURE: Informed written consent was obtained from the patient after a thorough discussion of the procedural risks, benefits and alternatives. All questions were addressed. Maximal Sterile Barrier Technique was utilized including caps, mask, sterile gowns, sterile gloves, sterile drape, hand hygiene and skin antiseptic. A timeout was performed prior to the initiation of the procedure. Preprocedure ultrasound evaluation of the right upper extremity demonstrated a patent basilic vein. The procedure was planned. Subdermal Local anesthesia was provided at the planned needle entry site with 1% lidocaine. A small skin nick was made. Under direct ultrasound visualization, the right basilic vein was punctured with a 21 gauge micropuncture needle. A permanent ultrasound image was captured and stored in the record. A guidewire was inserted over which a 5 French peel-away sheath was placed. The wire and inner portion of the sheath were removed and a 38 cm, dual lumen 5 French peripherally inserted central catheter were then placed. Catheter tip terminates near the cavoatrial junction. Both lumens flushed and aspirated with ease. A sterile bandage was applied. This venous access was used by the department of anesthesia throughout the procedure. A preliminary ultrasound of the right groin  was performed and demonstrates a patent right common femoral vein. A permanent ultrasound image was recorded. Using a combination of fluoroscopy and ultrasound, an access site was determined. A small dermatotomy was made at the planned puncture site. Using ultrasound guidance, access into the right common femoral vein was obtained with visualization of needle entry into the vessel using a standard micropuncture technique. A wire was advanced into the IVC insert all fascial dilation performed. An 8 [place], 11 cm vascular sheath was placed into the external iliac vein. Through this access site, an 107 [place] ICE catheter was advanced with ease under fluoroscopic guidance to the level of the intrahepatic inferior vena cava. A preliminary ultrasound of the right neck was performed and demonstrates a patent internal jugular vein. A permanent ultrasound image was recorded. Using a combination of fluoroscopy and ultrasound, an access site was determined. A small dermatotomy was made at the planned puncture site. Using ultrasound guidance, access into the right internal jugular vein was obtained with visualization of needle entry into the vessel using a standard micropuncture technique. A wire was advanced into the IVC and serial fascial dilation performed. A 10 French tips sheath was placed into the internal jugular vein and advanced to the IVC. The jugular sheath was retracted into the right atrium and manometry was performed and measured to be a mean of 14 mm Hg. A 5 French angled tip catheter was then directed into the left renal vein. Left renal venogram was performed which demonstrated patency and patent inflow from a large gastro renal shunt. The gastro renal shunt was then selected. A balloon occlusion catheter was inserted and inflated to occlude the outflow. Dedicated variceal venogram was then performed which demonstrated an isolated gastric varix with no significant evidence of venous outflow to the nearby  esophageal varices or portal venous system. Transvenous obliteration was then performed with approximately 6 mL of a solution of Gel-Foam slurry, air, 3% sotradecol, and lipiodol. The occlusion  balloon catheter was then flushed and coil embolization was performed at the inferior aspect of the gastro renal shunt with an assortment of 0.035" detachable Azur coils. The Fogarty catheter was then deflated and removed. Repeat left renal venogram demonstrated no evidence of reflux into the occluded gastro renal shunt. A 5 French angled tip catheter was then directed into the middle hepatic vein. Hepatic venogram was performed. These images demonstrated patent hepatic vein with no stenosis. The catheter was advanced to a wedge portion of the a patent vein over which the 10 French sheath was advanced into the middle hepatic vein. Using ICE ultrasound visualization the catheter as middle hepatic vein as well as the portal anatomy was defined. A planned exit site from the hepatic vein and puncture site from the suspected portal vein was placed into a single sonographic plane. Under direct ultrasound visualization, the ScorpionX needle was advanced into the suspected occluded portal vein. This was unsuccessful in cannulation. The needle set was removed. The left flank was then prepped and draped in standard fashion. Ultrasound demonstrated an enlarged spleen with a patent straight appearing vein near the splenic hilum which appeared appropriate for puncture. The under direct ultrasound visualization, the splenic vein was attempted to be cannulated with a 21 gauge Chiba needle making a proximally 6 separate passes, ultimately failing to gain access to the splenic vein. Completion ultrasound demonstrated no evidence of surrounding hematoma or other complicating features. Given failure of attempted trans splenic portal venous recanalization, additional attempt at transjugular transhepatic portosystemic shunt creation was attempted  with ICE in fluoroscopic guidance however from the right portal vein. A single pass was attempted at what appears to be in a track portal target in the right lobe of the liver, however there is inability to advance a wire into the structure. The needle set was removed once again. Next, attempted cannulating the large SMV to IVC ectopic varix in the right lower quadrant was attempted. Balloon occlusion venogram was performed which demonstrated primarily right gonadal vein opacification. There is no evidence of portal opacification for possible image guided targeting. At this point, the procedure was terminated. The catheters and sheath were removed and manual compression was applied to the right internal jugular and right greater saphenous venous access sites until hemostasis was achieved. The patient was transferred to the PACU in stable condition. IMPRESSION: 1. Technically successful coil assisted retrograde transvenous obliteration of isolated large gastrorenal variceal shunt. 2. Technically unsuccessful transjugular intrahepatic portosystemic shunt creation and attempted transsplenic portal vein recanalization. 3. Technically successful right upper extremity image guided peripherally inserted central catheter placement. Marliss Coots, MD [facility] Radiology Electronically Signed   By: Marliss Coots M.D.   On: 06/30/2023 21:46   IR US Guide Vasc Access Left  Result Date: 06/30/2023 CLINICAL DATA:  67 year old female with history of NASH cirrhosis (Child Pugh B, MELD 20), portal hypertension, and gastroesophageal varices without history of hemorrhage. 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 She has very large gastroesophageal varices. Her history of encephalopathy and hyperbilirubinemia is compatible with  portosystemic shunt syndrome. EXAM: 1. Ultrasound guided vascular access of the right basilic vein 2. Peripherally inserted central catheter placement 3. Ultrasound-guided access of the right internal jugular vein 4. Ultrasound-guided access of the right greater saphenous vein 5. Hepatic venogram 6. Intravascular ultrasound 7. Central venous manometery 8. Coil assisted retrograde  transvenous obliteration of gastrorenal varices MEDICATIONS: As antibiotic prophylaxis, Rocephin 2 gm IV was ordered pre-procedure and administered intravenously within one hour of incision. ANESTHESIA/SEDATION: General - as administered by the Anesthesia department CONTRAST:  One hundred sixty ML Omnipaque 300, intravenous FLUOROSCOPY TIME:  Fluoroscopy Time: 40 minutes (2,117 mGy). COMPLICATIONS: None immediate. PROCEDURE: Informed written consent was obtained from the patient after a thorough discussion of the procedural risks, benefits and alternatives. All questions were addressed. Maximal Sterile Barrier Technique was utilized including caps, mask, sterile gowns, sterile gloves, sterile drape, hand hygiene and skin antiseptic. A timeout was performed prior to the initiation of the procedure. Preprocedure ultrasound evaluation of the right upper extremity demonstrated a patent basilic vein. The procedure was planned. Subdermal Local anesthesia was provided at the planned needle entry site with 1% lidocaine. A small skin nick was made. Under direct ultrasound visualization, the right basilic vein was punctured with a 21 gauge micropuncture needle. A permanent ultrasound image was captured and stored in the record. A guidewire was inserted over which a 5 French peel-away sheath was placed. The wire and inner portion of the sheath were removed and a 38 cm, dual lumen 5 French peripherally inserted central catheter were then placed. Catheter tip terminates near the cavoatrial junction. Both lumens flushed and aspirated with ease. A sterile  bandage was applied. This venous access was used by the department of anesthesia throughout the procedure. A preliminary ultrasound of the right groin was performed and demonstrates a patent right common femoral vein. A permanent ultrasound image was recorded. Using a combination of fluoroscopy and ultrasound, an access site was determined. A small dermatotomy was made at the planned puncture site. Using ultrasound guidance, access into the right common femoral vein was obtained with visualization of needle entry into the vessel using a standard micropuncture technique. A wire was advanced into the IVC insert all fascial dilation performed. An 8 [place], 11 cm vascular sheath was placed into the external iliac vein. Through this access site, an 55 [place] ICE catheter was advanced with ease under fluoroscopic guidance to the level of the intrahepatic inferior vena cava. A preliminary ultrasound of the right neck was performed and demonstrates a patent internal jugular vein. A permanent ultrasound image was recorded. Using a combination of fluoroscopy and ultrasound, an access site was determined. A small dermatotomy was made at the planned puncture site. Using ultrasound guidance, access into the right internal jugular vein was obtained with visualization of needle entry into the vessel using a standard micropuncture technique. A wire was advanced into the IVC and serial fascial dilation performed. A 10 French tips sheath was placed into the internal jugular vein and advanced to the IVC. The jugular sheath was retracted into the right atrium and manometry was performed and measured to be a mean of 14 mm Hg. A 5 French angled tip catheter was then directed into the left renal vein. Left renal venogram was performed which demonstrated patency and patent inflow from a large gastro renal shunt. The gastro renal shunt was then selected. A balloon occlusion catheter was inserted and inflated to occlude the outflow.  Dedicated variceal venogram was then performed which demonstrated an isolated gastric varix with no significant evidence of venous outflow to the nearby esophageal varices or portal venous system. Transvenous obliteration was then performed with approximately 6 mL of a solution of Gel-Foam slurry, air, 3% sotradecol, and lipiodol. The occlusion balloon catheter was then flushed and coil embolization was performed at the  inferior aspect of the gastro renal shunt with an assortment of 0.035" detachable Azur coils. The Fogarty catheter was then deflated and removed. Repeat left renal venogram demonstrated no evidence of reflux into the occluded gastro renal shunt. A 5 French angled tip catheter was then directed into the middle hepatic vein. Hepatic venogram was performed. These images demonstrated patent hepatic vein with no stenosis. The catheter was advanced to a wedge portion of the a patent vein over which the 10 French sheath was advanced into the middle hepatic vein. Using ICE ultrasound visualization the catheter as middle hepatic vein as well as the portal anatomy was defined. A planned exit site from the hepatic vein and puncture site from the suspected portal vein was placed into a single sonographic plane. Under direct ultrasound visualization, the ScorpionX needle was advanced into the suspected occluded portal vein. This was unsuccessful in cannulation. The needle set was removed. The left flank was then prepped and draped in standard fashion. Ultrasound demonstrated an enlarged spleen with a patent straight appearing vein near the splenic hilum which appeared appropriate for puncture. The under direct ultrasound visualization, the splenic vein was attempted to be cannulated with a 21 gauge Chiba needle making a proximally 6 separate passes, ultimately failing to gain access to the splenic vein. Completion ultrasound demonstrated no evidence of surrounding hematoma or other complicating features. Given  failure of attempted trans splenic portal venous recanalization, additional attempt at transjugular transhepatic portosystemic shunt creation was attempted with ICE in fluoroscopic guidance however from the right portal vein. A single pass was attempted at what appears to be in a track portal target in the right lobe of the liver, however there is inability to advance a wire into the structure. The needle set was removed once again. Next, attempted cannulating the large SMV to IVC ectopic varix in the right lower quadrant was attempted. Balloon occlusion venogram was performed which demonstrated primarily right gonadal vein opacification. There is no evidence of portal opacification for possible image guided targeting. At this point, the procedure was terminated. The catheters and sheath were removed and manual compression was applied to the right internal jugular and right greater saphenous venous access sites until hemostasis was achieved. The patient was transferred to the PACU in stable condition. IMPRESSION: 1. Technically successful coil assisted retrograde transvenous obliteration of isolated large gastrorenal variceal shunt. 2. Technically unsuccessful transjugular intrahepatic portosystemic shunt creation and attempted transsplenic portal vein recanalization. 3. Technically successful right upper extremity image guided peripherally inserted central catheter placement. Marliss Coots, MD [facility] Radiology Electronically Signed   By: Marliss Coots M.D.   On: 06/30/2023 21:46   IR EMBO ART  VEN HEMORR LYMPH EXTRAV  INC GUIDE ROADMAPPING  Result Date: 06/30/2023 CLINICAL DATA:  67 year old female with history of NASH cirrhosis (Child Pugh B, MELD 20), portal hypertension, and gastroesophageal varices without history of hemorrhage. 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 She has very large gastroesophageal varices. 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 EXAM: 1. Ultrasound guided vascular access of the right basilic vein 2. Peripherally inserted central catheter placement 3. Ultrasound-guided access of the right internal jugular vein 4. Ultrasound-guided access of the right greater saphenous vein 5. Hepatic venogram 6. Intravascular ultrasound 7. Central venous manometery 8. Coil assisted retrograde transvenous obliteration of gastrorenal varices MEDICATIONS:  As antibiotic prophylaxis, Rocephin 2 gm IV was ordered pre-procedure and administered intravenously within one hour of incision. ANESTHESIA/SEDATION: General - as administered by the Anesthesia department CONTRAST:  One hundred sixty ML Omnipaque 300, intravenous FLUOROSCOPY TIME:  Fluoroscopy Time: 40 minutes (2,117 mGy). COMPLICATIONS: None immediate. PROCEDURE: Informed written consent was obtained from the patient after a thorough discussion of the procedural risks, benefits and alternatives. All questions were addressed. Maximal Sterile Barrier Technique was utilized including caps, mask, sterile gowns, sterile gloves, sterile drape, hand hygiene and skin antiseptic. A timeout was performed prior to the initiation of the procedure. Preprocedure ultrasound evaluation of the right upper extremity demonstrated a patent basilic vein. The procedure was planned. Subdermal Local anesthesia was provided at the planned needle entry site with 1% lidocaine. A small skin nick was made. Under direct ultrasound visualization, the right basilic vein was punctured with a 21 gauge micropuncture needle. A permanent ultrasound image was captured and stored in the record. A guidewire was inserted over which a 5 French peel-away sheath was placed. The wire and inner portion of the sheath were removed and a 38 cm, dual  lumen 5 French peripherally inserted central catheter were then placed. Catheter tip terminates near the cavoatrial junction. Both lumens flushed and aspirated with ease. A sterile bandage was applied. This venous access was used by the department of anesthesia throughout the procedure. A preliminary ultrasound of the right groin was performed and demonstrates a patent right common femoral vein. A permanent ultrasound image was recorded. Using a combination of fluoroscopy and ultrasound, an access site was determined. A small dermatotomy was made at the planned puncture site. Using ultrasound guidance, access into the right common femoral vein was obtained with visualization of needle entry into the vessel using a standard micropuncture technique. A wire was advanced into the IVC insert all fascial dilation performed. An 8 [place], 11 cm vascular sheath was placed into the external iliac vein. Through this access site, an 1 [place] ICE catheter was advanced with ease under fluoroscopic guidance to the level of the intrahepatic inferior vena cava. A preliminary ultrasound of the right neck was performed and demonstrates a patent internal jugular vein. A permanent ultrasound image was recorded. Using a combination of fluoroscopy and ultrasound, an access site was determined. A small dermatotomy was made at the planned puncture site. Using ultrasound guidance, access into the right internal jugular vein was obtained with visualization of needle entry into the vessel using a standard micropuncture technique. A wire was advanced into the IVC and serial fascial dilation performed. A 10 French tips sheath was placed into the internal jugular vein and advanced to the IVC. The jugular sheath was retracted into the right atrium and manometry was performed and measured to be a mean of 14 mm Hg. A 5 French angled tip catheter was then directed into the left renal vein. Left renal venogram was performed which demonstrated  patency and patent inflow from a large gastro renal shunt. The gastro renal shunt was then selected. A balloon occlusion catheter was inserted and inflated to occlude the outflow. Dedicated variceal venogram was then performed which demonstrated an isolated gastric varix with no significant evidence of venous outflow to the nearby esophageal varices or portal venous system. Transvenous obliteration was then performed with approximately 6 mL of a solution of Gel-Foam slurry, air, 3% sotradecol, and lipiodol. The occlusion balloon catheter was then flushed and coil embolization was performed at the inferior aspect of the gastro renal  shunt with an assortment of 0.035" detachable Azur coils. The Fogarty catheter was then deflated and removed. Repeat left renal venogram demonstrated no evidence of reflux into the occluded gastro renal shunt. A 5 French angled tip catheter was then directed into the middle hepatic vein. Hepatic venogram was performed. These images demonstrated patent hepatic vein with no stenosis. The catheter was advanced to a wedge portion of the a patent vein over which the 10 French sheath was advanced into the middle hepatic vein. Using ICE ultrasound visualization the catheter as middle hepatic vein as well as the portal anatomy was defined. A planned exit site from the hepatic vein and puncture site from the suspected portal vein was placed into a single sonographic plane. Under direct ultrasound visualization, the ScorpionX needle was advanced into the suspected occluded portal vein. This was unsuccessful in cannulation. The needle set was removed. The left flank was then prepped and draped in standard fashion. Ultrasound demonstrated an enlarged spleen with a patent straight appearing vein near the splenic hilum which appeared appropriate for puncture. The under direct ultrasound visualization, the splenic vein was attempted to be cannulated with a 21 gauge Chiba needle making a proximally 6  separate passes, ultimately failing to gain access to the splenic vein. Completion ultrasound demonstrated no evidence of surrounding hematoma or other complicating features. Given failure of attempted trans splenic portal venous recanalization, additional attempt at transjugular transhepatic portosystemic shunt creation was attempted with ICE in fluoroscopic guidance however from the right portal vein. A single pass was attempted at what appears to be in a track portal target in the right lobe of the liver, however there is inability to advance a wire into the structure. The needle set was removed once again. Next, attempted cannulating the large SMV to IVC ectopic varix in the right lower quadrant was attempted. Balloon occlusion venogram was performed which demonstrated primarily right gonadal vein opacification. There is no evidence of portal opacification for possible image guided targeting. At this point, the procedure was terminated. The catheters and sheath were removed and manual compression was applied to the right internal jugular and right greater saphenous venous access sites until hemostasis was achieved. The patient was transferred to the PACU in stable condition. IMPRESSION: 1. Technically successful coil assisted retrograde transvenous obliteration of isolated large gastrorenal variceal shunt. 2. Technically unsuccessful transjugular intrahepatic portosystemic shunt creation and attempted transsplenic portal vein recanalization. 3. Technically successful right upper extremity image guided peripherally inserted central catheter placement. Marliss Coots, MD [facility] Radiology Electronically Signed   By: Marliss Coots M.D.   On: 06/30/2023 21:46      Subjective:  Significant events overnight, she is eager to go home today Discharge Exam: Vitals:   07/11/23 0437 07/11/23 0806  BP: (!) 119/54 (!) 119/101  Pulse: (!) 103 99  Resp: 17 19   Temp: 97.6 F (36.4 C)   SpO2: 99% 97%   Vitals:   07/10/23 2004 07/11/23 0155 07/11/23 0437 07/11/23 0806  BP:  (!) 98/59 (!) 119/54 (!) 119/101  Pulse: 96  (!) 103 99  Resp: 19 19 17 19   Temp:  98.2 F (36.8 C) 97.6 F (36.4 C)   TempSrc:  Oral Oral   SpO2: 93% 95% 99% 97%  Weight:      Height:        General: Pt is alert, awake, not in acute distress, frail Cardiovascular: RRR, S1/S2 +, no rubs, no gallops Respiratory: CTA bilaterally, but diminished  at the bases Abdominal: Soft, NT, ND, bowel sounds + Extremities: +1 edema, no cyanosis    The results of significant diagnostics from this hospitalization (including imaging, microbiology, ancillary and laboratory) are listed below for reference.     Microbiology: Recent Results (from the past 240 hour(s))  Culture, blood (Routine X 2) w Reflex to ID Panel     Status: None   Collection Time: 07/04/23  2:02 AM   Specimen: BLOOD RIGHT HAND  Result Value Ref Range Status   Specimen Description BLOOD RIGHT HAND  Final   Special Requests   Final    BOTTLES DRAWN AEROBIC AND ANAEROBIC Blood Culture adequate volume   Culture   Final    NO GROWTH 5 DAYS Performed at [facility], [address] [place], [place] [address]    Report Status 07/09/2023 FINAL  Final  Culture, blood (Routine X 2) w Reflex to ID Panel     Status: Abnormal   Collection Time: 07/04/23  2:02 AM   Specimen: BLOOD  Result Value Ref Range Status   Specimen Description BLOOD LEFT ANTECUBITAL  Final   Special Requests   Final    BOTTLES DRAWN AEROBIC AND ANAEROBIC Blood Culture adequate volume   Culture  Setup Time   Final    GRAM POSITIVE COCCI IN CLUSTERS AEROBIC BOTTLE ONLY CRITICAL RESULT CALLED TO, READ BACK BY AND VERIFIED WITH: PHARMD M. BITONTI 07/06/2023 @ 0724 BY AB    Culture (A)  Final    STAPHYLOCOCCUS EPIDERMIDIS THE SIGNIFICANCE OF ISOLATING THIS ORGANISM FROM A SINGLE SET OF BLOOD CULTURES WHEN MULTIPLE SETS ARE DRAWN IS  UNCERTAIN. PLEASE NOTIFY THE MICROBIOLOGY DEPARTMENT WITHIN ONE WEEK IF SPECIATION AND SENSITIVITIES ARE REQUIRED. Performed at [facility], [address] [address] [place], [place] [address]    Report Status 07/07/2023 FINAL  Final  Blood Culture ID Panel (Reflexed)     Status: Abnormal   Collection Time: 07/04/23  2:02 AM  Result Value Ref Range Status   Enterococcus faecalis NOT DETECTED NOT DETECTED Final   Enterococcus Faecium NOT DETECTED NOT DETECTED Final   Listeria monocytogenes NOT DETECTED NOT DETECTED Final   Staphylococcus species DETECTED (A) NOT DETECTED Final    Comment: CRITICAL RESULT CALLED TO, READ BACK BY AND VERIFIED WITH: PHARMD M. BITONTI 07/06/2023 @ 0724 BY AB    Staphylococcus aureus (BCID) NOT DETECTED NOT DETECTED Final   Staphylococcus epidermidis DETECTED (A) NOT DETECTED Final    Comment: Methicillin (oxacillin) resistant coagulase negative staphylococcus. Possible blood culture contaminant (unless isolated from more than one blood culture draw or clinical case suggests pathogenicity). No antibiotic treatment is indicated for blood  culture contaminants. CRITICAL RESULT CALLED TO, READ BACK BY AND VERIFIED WITH: PHARMD M. BITONTI 07/06/2023 @ 0724 BY AB    Staphylococcus lugdunensis NOT DETECTED NOT DETECTED Final   Streptococcus species NOT DETECTED NOT DETECTED Final   Streptococcus agalactiae NOT DETECTED NOT DETECTED Final   Streptococcus pneumoniae NOT DETECTED NOT DETECTED Final   Streptococcus pyogenes NOT DETECTED NOT DETECTED Final   A.calcoaceticus-baumannii NOT DETECTED NOT DETECTED Final   Bacteroides fragilis NOT DETECTED NOT DETECTED Final   Enterobacterales NOT DETECTED NOT DETECTED Final   Enterobacter cloacae complex NOT DETECTED NOT DETECTED Final   Escherichia coli NOT DETECTED NOT DETECTED Final   Klebsiella aerogenes NOT DETECTED NOT DETECTED Final   Klebsiella oxytoca NOT DETECTED NOT DETECTED Final   Klebsiella pneumoniae NOT  DETECTED NOT DETECTED Final   Proteus  species NOT DETECTED NOT DETECTED Final   Salmonella species NOT DETECTED NOT DETECTED Final   Serratia marcescens NOT DETECTED NOT DETECTED Final   Haemophilus influenzae NOT DETECTED NOT DETECTED Final   Neisseria meningitidis NOT DETECTED NOT DETECTED Final   Pseudomonas aeruginosa NOT DETECTED NOT DETECTED Final   Stenotrophomonas maltophilia NOT DETECTED NOT DETECTED Final   Candida albicans NOT DETECTED NOT DETECTED Final   Candida auris NOT DETECTED NOT DETECTED Final   Candida glabrata NOT DETECTED NOT DETECTED Final   Candida krusei NOT DETECTED NOT DETECTED Final   Candida parapsilosis NOT DETECTED NOT DETECTED Final   Candida tropicalis NOT DETECTED NOT DETECTED Final   Cryptococcus neoformans/gattii NOT DETECTED NOT DETECTED Final   Methicillin resistance mecA/C DETECTED (A) NOT DETECTED Final    Comment: CRITICAL RESULT CALLED TO, READ BACK BY AND VERIFIED WITH: PHARMD M. BITONTI 07/06/2023 @ 0724 BY AB Performed at [facility], [address] [place], [place] [address]   MRSA Next Gen by PCR, Nasal     Status: None   Collection Time: 07/04/23  6:10 AM   Specimen: Nasal Mucosa; Nasal Swab  Result Value Ref Range Status   MRSA by PCR Next Gen NOT DETECTED NOT DETECTED Final    Comment: (NOTE) The GeneXpert MRSA Assay (FDA approved for NASAL specimens only), is one component of a comprehensive MRSA colonization surveillance program. It is not intended to diagnose MRSA infection nor to guide or monitor treatment for MRSA infections. Test performance is not FDA approved in patients less than 9 years old. Performed at [facility], [address], [place], [place] [address]      Labs: BNP (last 3 results) Recent Labs    07/04/23 0157 07/08/23 0401 07/09/23 0312  BNP 531.6* 926.8* 594.3*   Basic Metabolic Panel: Recent Labs  Lab 07/07/23 0852 07/08/23 0401 07/09/23 0312 07/10/23 0243 07/11/23 0335  NA 136  139 139 140 140  K 3.3* 3.4* 3.8 3.9 3.7  CL 101 100 105 104 104  CO2 25 24 26 27 26   GLUCOSE 209* 149* 149* 147* 136*  BUN 27* 28* 26* 25* 22  CREATININE 1.66* 1.53* 1.30* 1.36* 1.26*  CALCIUM 8.8* 9.8 8.9 9.1 9.3   Liver Function Tests: Recent Labs  Lab 07/07/23 0852 07/08/23 0401 07/09/23 0312 07/10/23 0243 07/11/23 0335  AST 34 31 30 28 29   ALT 30 28 26 22 24   ALKPHOS 86 85 87 81 78  BILITOT 5.7* 6.5* 5.1* 4.9* 4.3*  PROT 6.0* 5.8* 5.9* 5.8* 6.1*  ALBUMIN 3.4* 3.2* 3.2* 3.1* 3.1*   No results for input(s): "LIPASE", "AMYLASE" in the last 168 hours. No results for input(s): "AMMONIA" in the last 168 hours. CBC: Recent Labs  Lab 07/07/23 0852 07/08/23 0401 07/09/23 0312 07/10/23 0243 07/11/23 0335  WBC 3.1* 3.2* 3.7* 3.0* 3.3*  HGB 8.0* 8.4* 8.6* 8.5* 9.0*  HCT 24.2* 25.8* 26.8* 26.8* 28.1*  MCV 103.9* 101.6* 102.3* 105.1* 104.9*  PLT 19* 24* 28* 26* 36*   Cardiac Enzymes: No results for input(s): "CKTOTAL", "CKMB", "CKMBINDEX", "TROPONINI" in the last 168 hours. BNP: Invalid input(s): "POCBNP" CBG: Recent Labs  Lab 07/10/23 1539 07/10/23 2156 07/10/23 2359 07/11/23 0445 07/11/23 0805  GLUCAP 120* 134* 131* 116* 178*   D-Dimer No results for input(s): "DDIMER" in the last 72 hours. Hgb A1c No results for input(s): "HGBA1C" in the last 72 hours. Lipid Profile No results for input(s): "CHOL", "HDL", "LDLCALC", "TRIG", "CHOLHDL", "LDLDIRECT" in  the last 72 hours. Thyroid function studies No results for input(s): "TSH", "T4TOTAL", "T3FREE", "THYROIDAB" in the last 72 hours.  Invalid input(s): "FREET3" Anemia work up No results for input(s): "VITAMINB12", "FOLATE", "FERRITIN", "TIBC", "IRON", "RETICCTPCT" in the last 72 hours. Urinalysis    Component Value Date/Time   COLORURINE AMBER (A) 07/04/2023 0609   APPEARANCEUR HAZY (A) 07/04/2023 0609   LABSPEC 1.013 07/04/2023 0609   PHURINE 5.0 07/04/2023 0609   GLUCOSEU NEGATIVE 07/04/2023 0609    HGBUR NEGATIVE 07/04/2023 0609   BILIRUBINUR NEGATIVE 07/04/2023 0609   KETONESUR NEGATIVE 07/04/2023 0609   PROTEINUR NEGATIVE 07/04/2023 0609   UROBILINOGEN 2.0 (H) 01/31/2015 0055   NITRITE NEGATIVE 07/04/2023 0609   LEUKOCYTESUR NEGATIVE 07/04/2023 0609   Sepsis Labs Recent Labs  Lab 07/08/23 0401 07/09/23 0312 07/10/23 0243 07/11/23 0335  WBC 3.2* 3.7* 3.0* 3.3*   Microbiology Recent Results (from the past 240 hour(s))  Culture, blood (Routine X 2) w Reflex to ID Panel     Status: None   Collection Time: 07/04/23  2:02 AM   Specimen: BLOOD RIGHT HAND  Result Value Ref Range Status   Specimen Description BLOOD RIGHT HAND  Final   Special Requests   Final    BOTTLES DRAWN AEROBIC AND ANAEROBIC Blood Culture adequate volume   Culture   Final    NO GROWTH 5 DAYS Performed at [facility], [address] [place], [place] [address]    Report Status 07/09/2023 FINAL  Final  Culture, blood (Routine X 2) w Reflex to ID Panel     Status: Abnormal   Collection Time: 07/04/23  2:02 AM   Specimen: BLOOD  Result Value Ref Range Status   Specimen Description BLOOD LEFT ANTECUBITAL  Final   Special Requests   Final    BOTTLES DRAWN AEROBIC AND ANAEROBIC Blood Culture adequate volume   Culture  Setup Time   Final    GRAM POSITIVE COCCI IN CLUSTERS AEROBIC BOTTLE ONLY CRITICAL RESULT CALLED TO, READ BACK BY AND VERIFIED WITH: PHARMD M. BITONTI 07/06/2023 @ 0724 BY AB    Culture (A)  Final    STAPHYLOCOCCUS EPIDERMIDIS THE SIGNIFICANCE OF ISOLATING THIS ORGANISM FROM A SINGLE SET OF BLOOD CULTURES WHEN MULTIPLE SETS ARE DRAWN IS UNCERTAIN. PLEASE NOTIFY THE MICROBIOLOGY DEPARTMENT WITHIN ONE WEEK IF SPECIATION AND SENSITIVITIES ARE REQUIRED. Performed at [facility], [address] [place], [place] [address]    Report Status 07/07/2023 FINAL  Final  Blood Culture ID Panel (Reflexed)     Status: Abnormal   Collection Time: 07/04/23  2:02 AM  Result Value Ref Range  Status   Enterococcus faecalis NOT DETECTED NOT DETECTED Final   Enterococcus Faecium NOT DETECTED NOT DETECTED Final   Listeria monocytogenes NOT DETECTED NOT DETECTED Final   Staphylococcus species DETECTED (A) NOT DETECTED Final    Comment: CRITICAL RESULT CALLED TO, READ BACK BY AND VERIFIED WITH: PHARMD M. BITONTI 07/06/2023 @ 0724 BY AB    Staphylococcus aureus (BCID) NOT DETECTED NOT DETECTED Final   Staphylococcus epidermidis DETECTED (A) NOT DETECTED Final    Comment: Methicillin (oxacillin) resistant coagulase negative staphylococcus. Possible blood culture contaminant (unless isolated from more than one blood culture draw or clinical case suggests pathogenicity). No antibiotic treatment is indicated for blood  culture contaminants. CRITICAL RESULT CALLED TO, READ BACK BY AND VERIFIED WITH: PHARMD M. BITONTI 07/06/2023 @ 0724 BY AB    Staphylococcus lugdunensis NOT DETECTED NOT DETECTED Final   Streptococcus  species NOT DETECTED NOT DETECTED Final   Streptococcus agalactiae NOT DETECTED NOT DETECTED Final   Streptococcus pneumoniae NOT DETECTED NOT DETECTED Final   Streptococcus pyogenes NOT DETECTED NOT DETECTED Final   A.calcoaceticus-baumannii NOT DETECTED NOT DETECTED Final   Bacteroides fragilis NOT DETECTED NOT DETECTED Final   Enterobacterales NOT DETECTED NOT DETECTED Final   Enterobacter cloacae complex NOT DETECTED NOT DETECTED Final   Escherichia coli NOT DETECTED NOT DETECTED Final   Klebsiella aerogenes NOT DETECTED NOT DETECTED Final   Klebsiella oxytoca NOT DETECTED NOT DETECTED Final   Klebsiella pneumoniae NOT DETECTED NOT DETECTED Final   Proteus species NOT DETECTED NOT DETECTED Final   Salmonella species NOT DETECTED NOT DETECTED Final   Serratia marcescens NOT DETECTED NOT DETECTED Final   Haemophilus influenzae NOT DETECTED NOT DETECTED Final   Neisseria meningitidis NOT DETECTED NOT DETECTED Final   Pseudomonas aeruginosa NOT DETECTED NOT DETECTED  Final   Stenotrophomonas maltophilia NOT DETECTED NOT DETECTED Final   Candida albicans NOT DETECTED NOT DETECTED Final   Candida auris NOT DETECTED NOT DETECTED Final   Candida glabrata NOT DETECTED NOT DETECTED Final   Candida krusei NOT DETECTED NOT DETECTED Final   Candida parapsilosis NOT DETECTED NOT DETECTED Final   Candida tropicalis NOT DETECTED NOT DETECTED Final   Cryptococcus neoformans/gattii NOT DETECTED NOT DETECTED Final   Methicillin resistance mecA/C DETECTED (A) NOT DETECTED Final    Comment: CRITICAL RESULT CALLED TO, READ BACK BY AND VERIFIED WITH: PHARMD M. BITONTI 07/06/2023 @ 0724 BY AB Performed at [facility], [address], [place], [place] [address]   MRSA Next Gen by PCR, Nasal     Status: None   Collection Time: 07/04/23  6:10 AM   Specimen: Nasal Mucosa; Nasal Swab  Result Value Ref Range Status   MRSA by PCR Next Gen NOT DETECTED NOT DETECTED Final    Comment: (NOTE) The GeneXpert MRSA Assay (FDA approved for NASAL specimens only), is one component of a comprehensive MRSA colonization surveillance program. It is not intended to diagnose MRSA infection nor to guide or monitor treatment for MRSA infections. Test performance is not FDA approved in patients less than 53 years old. Performed at [facility], [address], [place], [place] [address]      Time coordinating discharge: Over 30 minutes  SIGNED:   Huey Bienenstock, MD  [facility] 07/11/2023, 11:46 AM Pager   If 7PM-7AM, please contact night-coverage [email or web address] Password [email or web address]

## 2023-07-11 NOTE — Progress Notes (Signed)
RA SL PICC removed per protocol per MD order. Manual pressure applied for 5 mins. Vaseline gauze, gauze, and Tegaderm applied over insertion site. No bleeding or swelling noted. Instructed patient to remain in bed for thirty mins. Educated patient about S/S of infection and when to call MD; no heavy lifting or pressure on right side for 24 hours; keep dressing dry and intact for 24 hours. Pt verbalized comprehension.

## 2023-07-11 NOTE — Plan of Care (Signed)
  Problem: Fluid Volume: Goal: Hemodynamic stability will improve Outcome: Progressing   Problem: Clinical Measurements: Goal: Diagnostic test results will improve Outcome: Progressing Goal: Signs and symptoms of infection will decrease Outcome: Progressing   Problem: Respiratory: Goal: Ability to maintain adequate ventilation will improve Outcome: Progressing   Problem: Education: Goal: Understanding of CV disease, CV risk reduction, and recovery process will improve Outcome: Progressing Goal: Individualized Educational Video(s) Outcome: Progressing   Problem: Activity: Goal: Ability to return to baseline activity level will improve Outcome: Progressing   Problem: Cardiovascular: Goal: Ability to achieve and maintain adequate cardiovascular perfusion will improve Outcome: Progressing Goal: Vascular access site(s) Level 0-1 will be maintained Outcome: Progressing   Problem: Health Behavior/Discharge Planning: Goal: Ability to safely manage health-related needs after discharge will improve Outcome: Progressing   Problem: Education: Goal: Ability to describe self-care measures that may prevent or decrease complications (Diabetes Survival Skills Education) will improve Outcome: Progressing Goal: Individualized Educational Video(s) Outcome: Progressing   Problem: Coping: Goal: Ability to adjust to condition or change in health will improve Outcome: Progressing   Problem: Fluid Volume: Goal: Ability to maintain a balanced intake and output will improve Outcome: Progressing   Problem: Health Behavior/Discharge Planning: Goal: Ability to identify and utilize available resources and services will improve Outcome: Progressing Goal: Ability to manage health-related needs will improve Outcome: Progressing   Problem: Metabolic: Goal: Ability to maintain appropriate glucose levels will improve Outcome: Progressing   Problem: Nutritional: Goal: Maintenance of adequate  nutrition will improve Outcome: Progressing Goal: Progress toward achieving an optimal weight will improve Outcome: Progressing   Problem: Skin Integrity: Goal: Risk for impaired skin integrity will decrease Outcome: Progressing   Problem: Tissue Perfusion: Goal: Adequacy of tissue perfusion will improve Outcome: Progressing   Problem: Education: Goal: Knowledge of General Education information will improve Description: Including pain rating scale, medication(s)/side effects and non-pharmacologic comfort measures Outcome: Progressing   Problem: Health Behavior/Discharge Planning: Goal: Ability to manage health-related needs will improve Outcome: Progressing   Problem: Clinical Measurements: Goal: Ability to maintain clinical measurements within normal limits will improve Outcome: Progressing Goal: Will remain free from infection Outcome: Progressing Goal: Diagnostic test results will improve Outcome: Progressing Goal: Respiratory complications will improve Outcome: Progressing Goal: Cardiovascular complication will be avoided Outcome: Progressing   Problem: Activity: Goal: Risk for activity intolerance will decrease Outcome: Progressing   Problem: Nutrition: Goal: Adequate nutrition will be maintained Outcome: Progressing   Problem: Coping: Goal: Level of anxiety will decrease Outcome: Progressing   Problem: Elimination: Goal: Will not experience complications related to bowel motility Outcome: Progressing Goal: Will not experience complications related to urinary retention Outcome: Progressing   Problem: Pain Managment: Goal: General experience of comfort will improve Outcome: Progressing   Problem: Safety: Goal: Ability to remain free from injury will improve Outcome: Progressing   Problem: Skin Integrity: Goal: Risk for impaired skin integrity will decrease Outcome: Progressing

## 2023-07-11 NOTE — Discharge Instructions (Signed)
Follow with Primary MD Koirala, Dibas, MD in 7 days   Get CBC, CMP,  checked  by Primary MD next visit.    Activity: As tolerated with Full fall precautions use walker/cane & assistance as needed   Disposition Home    Diet: Heart Healthy /low-sodium, with 1500 cc fluid restriction per day.  On your next visit with your primary care physician please Get Medicines reviewed and adjusted.   Please request your Prim.MD to go over all Hospital Tests and Procedure/Radiological results at the follow up, please get all Hospital records sent to your Prim MD by signing hospital release before you go home.   If you experience worsening of your admission symptoms, develop shortness of breath, life threatening emergency, suicidal or homicidal thoughts you must seek medical attention immediately by calling 911 or calling your MD immediately  if symptoms less severe.  You Must read complete instructions/literature along with all the possible adverse reactions/side effects for all the Medicines you take and that have been prescribed to you. Take any new Medicines after you have completely understood and accpet all the possible adverse reactions/side effects.   Do not drive, operating heavy machinery, perform activities at heights, swimming or participation in water activities or provide baby sitting services if your were admitted for syncope or siezures until you have seen by Primary MD or a Neurologist and advised to do so again.  Do not drive when taking Pain medications.    Do not take more than prescribed Pain, Sleep and Anxiety Medications  Special Instructions: If you have smoked or chewed Tobacco  in the last 2 yrs please stop smoking, stop any regular Alcohol  and or any Recreational drug use.  Wear Seat belts while driving.   Please note  You were cared for by a hospitalist during your hospital stay. If you have any questions about your discharge medications or the care you received  while you were in the hospital after you are discharged, you can call the unit and asked to speak with the hospitalist on call if the hospitalist that took care of you is not available. Once you are discharged, your primary care physician will handle any further medical issues. Please note that NO REFILLS for any discharge medications will be authorized once you are discharged, as it is imperative that you return to your primary care physician (or establish a relationship with a primary care physician if you do not have one) for your aftercare needs so that they can reassess your need for medications and monitor your lab values.

## 2023-07-11 NOTE — TOC Transition Note (Signed)
Transition of Care [facility]) - CM/SW Discharge Note   Patient Details  Name: Hannah Khan MRN: [id number] Date of Birth: 02-Sep-1956  Transition [facility]) CM/SW Contact:  Gordy Clement, RN Phone Number: 07/11/2023, 11:48 AM   Clinical Narrative:     Patient to Dc to home today. [facility] will provide [employer] changes and PT. No DME has been recommended. Husband to transport home. Waiting on Ortho to put on unna boots before discharging home           Patient Goals and CMS Choice      Discharge Placement                         Discharge Plan and Services Additional resources added to the After Visit Summary for                                       Social Determinants of Health (SDOH) Interventions SDOH Screenings   Food Insecurity: No Food Insecurity (07/04/2023)  Housing: Low Risk  (07/04/2023)  Transportation Needs: No Transportation Needs (07/04/2023)  Utilities: Not At Risk (07/04/2023)  Social Connections: Unknown (01/31/2022)   Received from [facility]  Tobacco Use: Medium Risk (07/04/2023)     Readmission Risk Interventions    04/08/2023    9:42 AM 03/12/2023    1:47 PM  Readmission Risk Prevention Plan  Transportation Screening Complete Complete  PCP or Specialist Appt within 3-5 Days Complete   Home Care Screening  Complete  Medication Review (RN CM)  Complete  HRI or Home Care Consult Complete   Social Work Consult for Recovery Care Planning/Counseling Complete   Palliative Care Screening Not Applicable   Medication Review Oceanographer) Complete

## 2023-07-11 NOTE — Progress Notes (Signed)
Progress Note  Patient Name: Hannah Khan Date of Encounter: 07/11/2023  Primary Cardiologist:   Chilton Si, MD   Subjective   Incomplete I/Os.  Renal function mildly improved (Cr 1.53>1.30>1.36>1.26). Platelets 36, Hgb 9.0.  She denies any chest pain or dyspnea.  Inpatient Medications    Scheduled Meds:  Chlorhexidine Gluconate Cloth  6 each Topical Daily   digoxin  0.25 mg Oral Daily   insulin aspart  0-9 Units Subcutaneous Q4H   lactulose  20 g Oral TID   lidocaine  2 patch Transdermal Q24H   metoprolol tartrate  25 mg Oral Q12H   midodrine  10 mg Oral TID WC   rifaximin  550 mg Oral BID   sodium chloride flush  10-40 mL Intracatheter Q12H   spironolactone  100 mg Oral Daily   Continuous Infusions:  furosemide Stopped (07/10/23 2346)   PRN Meds: naLOXone (NARCAN)  injection, ondansetron **OR** ondansetron (ZOFRAN) IV, mouth rinse, oxyCODONE, sodium chloride flush   Vital Signs    Vitals:   07/10/23 1941 07/10/23 2004 07/11/23 0155 07/11/23 0437  BP: (!) 106/47  (!) 98/59 (!) 119/54  Pulse: 99 96  (!) 103  Resp: 20 19 19 17   Temp: 98.6 F (37 C)  98.2 F (36.8 C) 97.6 F (36.4 C)  TempSrc: Oral  Oral Oral  SpO2: 95% 93% 95% 99%  Weight:      Height:        Intake/Output Summary (Last 24 hours) at 07/11/2023 1006 Last data filed at 07/11/2023 1610 Gross per 24 hour  Intake 356.84 ml  Output --  Net 356.84 ml   Filed Weights   07/06/23 0420 07/07/23 0318 07/10/23 1400  Weight: (!) 154 kg (!) 156.9 kg (!) 152.3 kg    Telemetry    Atrial fib with  controlled rate - Personally Reviewed  ECG    NA - Personally Reviewed  Physical Exam   GEN: No acute distress.   Neck: No JVD appreciated but difficult to assess given habitus Cardiac: Irregular RR, 3/6 murmurs Respiratory: Clear , no diasotlic to auscultation bilaterally. GI: Soft, nontender, non-distended  MS: 1+ BLE edema Neuro:  Nonfocal  Psych: Normal affect   Labs     Chemistry Recent Labs  Lab 07/09/23 0312 07/10/23 0243 07/11/23 0335  NA 139 140 140  K 3.8 3.9 3.7  CL 105 104 104  CO2 26 27 26   GLUCOSE 149* 147* 136*  BUN 26* 25* 22  CREATININE 1.30* 1.36* 1.26*  CALCIUM 8.9 9.1 9.3  PROT 5.9* 5.8* 6.1*  ALBUMIN 3.2* 3.1* 3.1*  AST 30 28 29   ALT 26 22 24   ALKPHOS 87 81 78  BILITOT 5.1* 4.9* 4.3*  GFRNONAA 45* 43* 47*  ANIONGAP 8 9 10      Hematology Recent Labs  Lab 07/09/23 0312 07/10/23 0243 07/11/23 0335  WBC 3.7* 3.0* 3.3*  RBC 2.62* 2.55* 2.68*  HGB 8.6* 8.5* 9.0*  HCT 26.8* 26.8* 28.1*  MCV 102.3* 105.1* 104.9*  MCH 32.8 33.3 33.6  MCHC 32.1 31.7 32.0  RDW 18.5* 18.1* 18.7*  PLT 28* 26* 36*    Cardiac EnzymesNo results for input(s): "TROPONINI" in the last 168 hours. No results for input(s): "TROPIPOC" in the last 168 hours.   BNP Recent Labs  Lab 07/08/23 0401 07/09/23 0312  BNP 926.8* 594.3*     DDimer No results for input(s): "DDIMER" in the last 168 hours.   Radiology    No results found.  Cardiac Studies   NA  Patient Profile     67 y.o. female with a hx of HFpEF, morbid obesity, OSA, NASH cirrhosis, esophageal varices, history of hepatic encephalopathy, s/p unsuccessful TIPS 06/30/2023, thrombocytopenia, type 2 diabetes, aortic stenosis, vasovagal syncope, and permanent atrial fibrillation, recurrent panniculitis, cardiology is following for acute CHF and A fib with RVR. S/P recent unsuccessful TIPS on 06/30/23, complicated by hemoperitoneum on CT post procedure. She came in initially with nausea, vomiting, hypotension, A fib RVR, in the setting of acute blood loss anemia and AKI.   Assessment & Plan    Acute blood loss anemia:Hemoglobin 6.3 on admission.  Due to hemoperitoneum.  Has received 4 units PRBCs.  Hgb stable at 9.0  Hypotension: Likely multifactorial in setting of acute blood loss anemia, cirrhosis, A-fib.  Status post albumin.  Continue midodrine, can reduce dose back to 10 mg  TID  Acute on chronic diastolic heart failure.  Volume overloaded on exam.  Started on IV Lasix and p.o. spironolactone, can convert to PO torsemide today  Thrombocytopenia: In setting of cirrhosis.  Has received multiple units of platelets  Permanent atrial fibrillation: Has been in A-fib with RVR in setting of acute blood loss anemia and hypotension.  Not a candidate for anticoagulation in setting of acute blood loss anemia and thrombocytopenia.  On metoprolol 25 mg twice daily and digoxin 0.25 mg daily for rate control.  Rates currently well controlled.  [facility] will sign off.   Medication Recommendations:  Metoprolol 25 mg BID, digoxin 0.25 mg daily, spironolactone 25 mg daily, torsemide 80 mg daily (would monitor daily weights and take torsemide BID if gains more than 3 lbs in 1 day or 5 lbs in 1 week) Other recommendations (labs, testing, etc):  Needs BMET in 1 week Follow up as an outpatient:  Has f/u scheduled for 11/4   For questions or updates, please contact [facility] Please consult [email or web address] for contact info under Cardiology/STEMI.   Signed, Little Ishikawa, MD  07/11/2023, 10:06 AM

## 2023-07-12 DIAGNOSIS — K7581 Nonalcoholic steatohepatitis (NASH): Secondary | ICD-10-CM | POA: Diagnosis not present

## 2023-07-12 DIAGNOSIS — N1832 Chronic kidney disease, stage 3b: Secondary | ICD-10-CM | POA: Diagnosis not present

## 2023-07-12 DIAGNOSIS — D62 Acute posthemorrhagic anemia: Secondary | ICD-10-CM | POA: Diagnosis not present

## 2023-07-12 DIAGNOSIS — I350 Nonrheumatic aortic (valve) stenosis: Secondary | ICD-10-CM | POA: Diagnosis not present

## 2023-07-12 DIAGNOSIS — E1122 Type 2 diabetes mellitus with diabetic chronic kidney disease: Secondary | ICD-10-CM | POA: Diagnosis not present

## 2023-07-12 DIAGNOSIS — L03311 Cellulitis of abdominal wall: Secondary | ICD-10-CM | POA: Diagnosis not present

## 2023-07-12 DIAGNOSIS — I5033 Acute on chronic diastolic (congestive) heart failure: Secondary | ICD-10-CM | POA: Diagnosis not present

## 2023-07-12 DIAGNOSIS — I130 Hypertensive heart and chronic kidney disease with heart failure and stage 1 through stage 4 chronic kidney disease, or unspecified chronic kidney disease: Secondary | ICD-10-CM | POA: Diagnosis not present

## 2023-07-14 DIAGNOSIS — D62 Acute posthemorrhagic anemia: Secondary | ICD-10-CM | POA: Diagnosis not present

## 2023-07-14 DIAGNOSIS — L03311 Cellulitis of abdominal wall: Secondary | ICD-10-CM | POA: Diagnosis not present

## 2023-07-14 DIAGNOSIS — E1122 Type 2 diabetes mellitus with diabetic chronic kidney disease: Secondary | ICD-10-CM | POA: Diagnosis not present

## 2023-07-14 DIAGNOSIS — K7581 Nonalcoholic steatohepatitis (NASH): Secondary | ICD-10-CM | POA: Diagnosis not present

## 2023-07-14 DIAGNOSIS — N1832 Chronic kidney disease, stage 3b: Secondary | ICD-10-CM | POA: Diagnosis not present

## 2023-07-14 DIAGNOSIS — I5033 Acute on chronic diastolic (congestive) heart failure: Secondary | ICD-10-CM | POA: Diagnosis not present

## 2023-07-14 DIAGNOSIS — I350 Nonrheumatic aortic (valve) stenosis: Secondary | ICD-10-CM | POA: Diagnosis not present

## 2023-07-14 DIAGNOSIS — I130 Hypertensive heart and chronic kidney disease with heart failure and stage 1 through stage 4 chronic kidney disease, or unspecified chronic kidney disease: Secondary | ICD-10-CM | POA: Diagnosis not present

## 2023-07-16 DIAGNOSIS — I350 Nonrheumatic aortic (valve) stenosis: Secondary | ICD-10-CM | POA: Diagnosis not present

## 2023-07-16 DIAGNOSIS — D62 Acute posthemorrhagic anemia: Secondary | ICD-10-CM | POA: Diagnosis not present

## 2023-07-16 DIAGNOSIS — N1832 Chronic kidney disease, stage 3b: Secondary | ICD-10-CM | POA: Diagnosis not present

## 2023-07-16 DIAGNOSIS — L03311 Cellulitis of abdominal wall: Secondary | ICD-10-CM | POA: Diagnosis not present

## 2023-07-16 DIAGNOSIS — E1122 Type 2 diabetes mellitus with diabetic chronic kidney disease: Secondary | ICD-10-CM | POA: Diagnosis not present

## 2023-07-16 DIAGNOSIS — I130 Hypertensive heart and chronic kidney disease with heart failure and stage 1 through stage 4 chronic kidney disease, or unspecified chronic kidney disease: Secondary | ICD-10-CM | POA: Diagnosis not present

## 2023-07-16 DIAGNOSIS — K7581 Nonalcoholic steatohepatitis (NASH): Secondary | ICD-10-CM | POA: Diagnosis not present

## 2023-07-16 DIAGNOSIS — I5033 Acute on chronic diastolic (congestive) heart failure: Secondary | ICD-10-CM | POA: Diagnosis not present

## 2023-07-17 DIAGNOSIS — I350 Nonrheumatic aortic (valve) stenosis: Secondary | ICD-10-CM | POA: Diagnosis not present

## 2023-07-17 DIAGNOSIS — I130 Hypertensive heart and chronic kidney disease with heart failure and stage 1 through stage 4 chronic kidney disease, or unspecified chronic kidney disease: Secondary | ICD-10-CM | POA: Diagnosis not present

## 2023-07-17 DIAGNOSIS — D62 Acute posthemorrhagic anemia: Secondary | ICD-10-CM | POA: Diagnosis not present

## 2023-07-17 DIAGNOSIS — I5033 Acute on chronic diastolic (congestive) heart failure: Secondary | ICD-10-CM | POA: Diagnosis not present

## 2023-07-17 DIAGNOSIS — E1122 Type 2 diabetes mellitus with diabetic chronic kidney disease: Secondary | ICD-10-CM | POA: Diagnosis not present

## 2023-07-17 DIAGNOSIS — N1832 Chronic kidney disease, stage 3b: Secondary | ICD-10-CM | POA: Diagnosis not present

## 2023-07-17 DIAGNOSIS — K7581 Nonalcoholic steatohepatitis (NASH): Secondary | ICD-10-CM | POA: Diagnosis not present

## 2023-07-17 DIAGNOSIS — L03311 Cellulitis of abdominal wall: Secondary | ICD-10-CM | POA: Diagnosis not present

## 2023-07-18 DIAGNOSIS — L03311 Cellulitis of abdominal wall: Secondary | ICD-10-CM | POA: Diagnosis not present

## 2023-07-18 DIAGNOSIS — K7460 Unspecified cirrhosis of liver: Secondary | ICD-10-CM | POA: Diagnosis not present

## 2023-07-18 DIAGNOSIS — G4733 Obstructive sleep apnea (adult) (pediatric): Secondary | ICD-10-CM | POA: Diagnosis not present

## 2023-07-18 DIAGNOSIS — N1832 Chronic kidney disease, stage 3b: Secondary | ICD-10-CM | POA: Diagnosis not present

## 2023-07-18 DIAGNOSIS — K7581 Nonalcoholic steatohepatitis (NASH): Secondary | ICD-10-CM | POA: Diagnosis not present

## 2023-07-18 DIAGNOSIS — I350 Nonrheumatic aortic (valve) stenosis: Secondary | ICD-10-CM | POA: Diagnosis not present

## 2023-07-18 DIAGNOSIS — D62 Acute posthemorrhagic anemia: Secondary | ICD-10-CM | POA: Diagnosis not present

## 2023-07-18 DIAGNOSIS — E1122 Type 2 diabetes mellitus with diabetic chronic kidney disease: Secondary | ICD-10-CM | POA: Diagnosis not present

## 2023-07-18 DIAGNOSIS — I5033 Acute on chronic diastolic (congestive) heart failure: Secondary | ICD-10-CM | POA: Diagnosis not present

## 2023-07-18 DIAGNOSIS — I130 Hypertensive heart and chronic kidney disease with heart failure and stage 1 through stage 4 chronic kidney disease, or unspecified chronic kidney disease: Secondary | ICD-10-CM | POA: Diagnosis not present

## 2023-07-19 DIAGNOSIS — I5033 Acute on chronic diastolic (congestive) heart failure: Secondary | ICD-10-CM | POA: Diagnosis not present

## 2023-07-19 DIAGNOSIS — E1122 Type 2 diabetes mellitus with diabetic chronic kidney disease: Secondary | ICD-10-CM | POA: Diagnosis not present

## 2023-07-19 DIAGNOSIS — N1832 Chronic kidney disease, stage 3b: Secondary | ICD-10-CM | POA: Diagnosis not present

## 2023-07-19 DIAGNOSIS — D62 Acute posthemorrhagic anemia: Secondary | ICD-10-CM | POA: Diagnosis not present

## 2023-07-19 DIAGNOSIS — L03311 Cellulitis of abdominal wall: Secondary | ICD-10-CM | POA: Diagnosis not present

## 2023-07-19 DIAGNOSIS — K7581 Nonalcoholic steatohepatitis (NASH): Secondary | ICD-10-CM | POA: Diagnosis not present

## 2023-07-19 DIAGNOSIS — I350 Nonrheumatic aortic (valve) stenosis: Secondary | ICD-10-CM | POA: Diagnosis not present

## 2023-07-19 DIAGNOSIS — I130 Hypertensive heart and chronic kidney disease with heart failure and stage 1 through stage 4 chronic kidney disease, or unspecified chronic kidney disease: Secondary | ICD-10-CM | POA: Diagnosis not present

## 2023-07-20 DIAGNOSIS — H2513 Age-related nuclear cataract, bilateral: Secondary | ICD-10-CM | POA: Diagnosis not present

## 2023-07-20 DIAGNOSIS — E113293 Type 2 diabetes mellitus with mild nonproliferative diabetic retinopathy without macular edema, bilateral: Secondary | ICD-10-CM | POA: Diagnosis not present

## 2023-07-20 DIAGNOSIS — H43813 Vitreous degeneration, bilateral: Secondary | ICD-10-CM | POA: Diagnosis not present

## 2023-07-20 DIAGNOSIS — H35033 Hypertensive retinopathy, bilateral: Secondary | ICD-10-CM | POA: Diagnosis not present

## 2023-07-20 DIAGNOSIS — H353221 Exudative age-related macular degeneration, left eye, with active choroidal neovascularization: Secondary | ICD-10-CM | POA: Diagnosis not present

## 2023-07-20 DIAGNOSIS — H353112 Nonexudative age-related macular degeneration, right eye, intermediate dry stage: Secondary | ICD-10-CM | POA: Diagnosis not present

## 2023-07-21 DIAGNOSIS — I130 Hypertensive heart and chronic kidney disease with heart failure and stage 1 through stage 4 chronic kidney disease, or unspecified chronic kidney disease: Secondary | ICD-10-CM | POA: Diagnosis not present

## 2023-07-21 DIAGNOSIS — N1832 Chronic kidney disease, stage 3b: Secondary | ICD-10-CM | POA: Diagnosis not present

## 2023-07-21 DIAGNOSIS — D62 Acute posthemorrhagic anemia: Secondary | ICD-10-CM | POA: Diagnosis not present

## 2023-07-21 DIAGNOSIS — E1122 Type 2 diabetes mellitus with diabetic chronic kidney disease: Secondary | ICD-10-CM | POA: Diagnosis not present

## 2023-07-21 DIAGNOSIS — K7581 Nonalcoholic steatohepatitis (NASH): Secondary | ICD-10-CM | POA: Diagnosis not present

## 2023-07-21 DIAGNOSIS — I350 Nonrheumatic aortic (valve) stenosis: Secondary | ICD-10-CM | POA: Diagnosis not present

## 2023-07-21 DIAGNOSIS — L03311 Cellulitis of abdominal wall: Secondary | ICD-10-CM | POA: Diagnosis not present

## 2023-07-21 DIAGNOSIS — I5033 Acute on chronic diastolic (congestive) heart failure: Secondary | ICD-10-CM | POA: Diagnosis not present

## 2023-07-24 ENCOUNTER — Other Ambulatory Visit: Payer: Self-pay | Admitting: *Deleted

## 2023-07-24 ENCOUNTER — Ambulatory Visit (HOSPITAL_BASED_OUTPATIENT_CLINIC_OR_DEPARTMENT_OTHER): Payer: Medicare PPO | Admitting: Family

## 2023-07-24 DIAGNOSIS — E1122 Type 2 diabetes mellitus with diabetic chronic kidney disease: Secondary | ICD-10-CM | POA: Diagnosis not present

## 2023-07-24 DIAGNOSIS — N1832 Chronic kidney disease, stage 3b: Secondary | ICD-10-CM | POA: Diagnosis not present

## 2023-07-24 DIAGNOSIS — I130 Hypertensive heart and chronic kidney disease with heart failure and stage 1 through stage 4 chronic kidney disease, or unspecified chronic kidney disease: Secondary | ICD-10-CM | POA: Diagnosis not present

## 2023-07-24 DIAGNOSIS — I872 Venous insufficiency (chronic) (peripheral): Secondary | ICD-10-CM

## 2023-07-24 DIAGNOSIS — I350 Nonrheumatic aortic (valve) stenosis: Secondary | ICD-10-CM | POA: Diagnosis not present

## 2023-07-24 DIAGNOSIS — H2513 Age-related nuclear cataract, bilateral: Secondary | ICD-10-CM | POA: Diagnosis not present

## 2023-07-24 DIAGNOSIS — K7581 Nonalcoholic steatohepatitis (NASH): Secondary | ICD-10-CM | POA: Diagnosis not present

## 2023-07-24 DIAGNOSIS — H2511 Age-related nuclear cataract, right eye: Secondary | ICD-10-CM | POA: Diagnosis not present

## 2023-07-24 DIAGNOSIS — D62 Acute posthemorrhagic anemia: Secondary | ICD-10-CM | POA: Diagnosis not present

## 2023-07-24 DIAGNOSIS — I5033 Acute on chronic diastolic (congestive) heart failure: Secondary | ICD-10-CM | POA: Diagnosis not present

## 2023-07-24 DIAGNOSIS — L03311 Cellulitis of abdominal wall: Secondary | ICD-10-CM | POA: Diagnosis not present

## 2023-07-25 ENCOUNTER — Telehealth (HOSPITAL_COMMUNITY): Payer: Self-pay | Admitting: Student

## 2023-07-25 DIAGNOSIS — H2512 Age-related nuclear cataract, left eye: Secondary | ICD-10-CM | POA: Diagnosis not present

## 2023-07-25 NOTE — Telephone Encounter (Signed)
   [facility] TIPS post-procedure phone call follow-up   Hannah Khan is a 67 y.o. female who underwent attempted TIPS creation with successful variceal embolization at [facility] 06/30/23 with Dr. Elby Showers. Her post-procedure recovery was complicated by atrial fibrillation with RVR, hypotension and nausea with decreased hemoglobin levels and abdominal pain secondary to small to moderate volume hemoperitoneum. She was treated conservatively and discharged home 07/02/23.   She returned to the hospital 07/03/23 with complaints of nausea, weakness and abdominal pain. She was hypotensive, anemic and volume overloaded. She received supportive care including PRBC administration, cardiology consult and medications to manage hypotension, atrial fibrillation and volume overload. She was discharged home 07/11/23.  Patient is approximately 3.5 weeks/months from procedure.   # of paracentesis in last month: 0 # of paracentesis in last 2 months: 0  Current diuretic regimen: Aldactone 25 mg daily, Torsemide 80 mg daily  Current pharmacologic encephalopathy prophylaxis/treatment: Lactulose 20 g TID, Xifaxan 550 mg BID  Post-procedure Imaging: CT abdomen/Pelvis 07/03/23   Labs: 07/11/23 Creatinine: 1.26 Total Bilirubin: 4.3 INR: 1.4 07/06/23 Sodium: 140 Albumin: 3.1 Ammonia: n/a  Assessment: [name] is a 67 y.o. female with history of Nash Cirrhosis with varices. Patient is 3.5 weeks from attempted TIPS creation with successful variceal embolization. I spoke with the patient today and she states she has improved quite a bit since her hospital discharge. She is working with physical therapy at home several days each week. She is also getting her legs wrapped M-W-F to help with lower extremity swelling. She has lost over 30 lb since hospital discharge. She states her heart rate and blood pressure are in an acceptable range. Her appetite is ok and she still has occasional  nausea. She just had cataract surgery a few days ago. She currently denies any needs or requests from IR at this time.   Recommendation:  Mrs. Berko will follow up with Dr. Elby Showers sometime in the next few weeks. A scheduler from our office will call her with a date/time of her appointment. She is to also have an imaging study performed prior to her appointment. Mrs. Molden has the number to the IR APP office at [facility] and she knows she can call me any time with questions/concerns.   Electronically Signed: Mickie Kay, NP 07/25/2023, 11:12 AM

## 2023-07-26 DIAGNOSIS — K7581 Nonalcoholic steatohepatitis (NASH): Secondary | ICD-10-CM | POA: Diagnosis not present

## 2023-07-26 DIAGNOSIS — L03311 Cellulitis of abdominal wall: Secondary | ICD-10-CM | POA: Diagnosis not present

## 2023-07-26 DIAGNOSIS — I5033 Acute on chronic diastolic (congestive) heart failure: Secondary | ICD-10-CM | POA: Diagnosis not present

## 2023-07-26 DIAGNOSIS — D62 Acute posthemorrhagic anemia: Secondary | ICD-10-CM | POA: Diagnosis not present

## 2023-07-26 DIAGNOSIS — I130 Hypertensive heart and chronic kidney disease with heart failure and stage 1 through stage 4 chronic kidney disease, or unspecified chronic kidney disease: Secondary | ICD-10-CM | POA: Diagnosis not present

## 2023-07-26 DIAGNOSIS — E1122 Type 2 diabetes mellitus with diabetic chronic kidney disease: Secondary | ICD-10-CM | POA: Diagnosis not present

## 2023-07-26 DIAGNOSIS — N1832 Chronic kidney disease, stage 3b: Secondary | ICD-10-CM | POA: Diagnosis not present

## 2023-07-26 DIAGNOSIS — I350 Nonrheumatic aortic (valve) stenosis: Secondary | ICD-10-CM | POA: Diagnosis not present

## 2023-07-28 ENCOUNTER — Ambulatory Visit (HOSPITAL_COMMUNITY)
Admission: RE | Admit: 2023-07-28 | Discharge: 2023-07-28 | Disposition: A | Discharge status: Home / Self Care | Payer: Medicare PPO | Source: Ambulatory Visit | Attending: Vascular Surgery | Admitting: Vascular Surgery

## 2023-07-28 ENCOUNTER — Ambulatory Visit: Payer: Medicare PPO | Admitting: Physician Assistant

## 2023-07-28 VITALS — BP 121/67 | HR 82 | Temp 97.7°F | Ht 67.0 in | Wt 305.5 lb

## 2023-07-28 DIAGNOSIS — I872 Venous insufficiency (chronic) (peripheral): Secondary | ICD-10-CM | POA: Diagnosis not present

## 2023-07-28 DIAGNOSIS — I890 Lymphedema, not elsewhere classified: Secondary | ICD-10-CM

## 2023-07-28 NOTE — Progress Notes (Signed)
Office Note     CC:  follow up Requesting Provider:  Darrow Bussing, MD  HPI: Hannah Khan is a 67 y.o. ([birth year]) female who presents for evaluation of bilateral lower extremity edema.  The patient has also been diagnosed with bilateral lower extremity lymphedema.  She has history of weeping and blistering especially of the right lower extremity.  After recent hospitalization she was set up with home health for compression wraps for 5 weeks.  She believes the swelling in her legs has drastically improved.  She has also lost about 30 pounds since hospital stay and feels like that has also helped with her swelling symptoms.  She denies any history of DVT.  She is exercising more frequently.  She is also elevating her legs periodically during the day.  Past Medical History:  Diagnosis Date   Anemia    hx of   Aortic stenosis 02/2023   Echo 03/07/23 LVEF 55-60%, mild to moderate AS   Arthritis    knees   CHF (congestive heart failure) (HCC)    chronic diastolic   Diabetes mellitus without complication (HCC)    type 2   Dyspnea    Dysrhythmia    Fibromyalgia    Gastric varices    large   H/O transfusion of platelets    Heart murmur    never has caused any problems   Hx of colonic polyps    s/p partial colectomy   Hyperlipidemia    Hypertension    Joint pain    in knees and back spasms   Left leg swelling    wear compression hose   Liver cirrhosis secondary to NASH (nonalcoholic steatohepatitis) (HCC) dx nov 2014   had enlarged spleen also    Morbid obesity (HCC)    Persistent atrial fibrillation (HCC)    Pneumonia 10/2016   x 5   PONV (postoperative nausea and vomiting)    in past none recent   Portal hypertension (HCC)    Sleep apnea    uses cpap, setting varies between 14-20   Spleen enlarged    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 [facility])     Past Surgical History:  Procedure Laterality Date   BREAST LUMPECTOMY WITH RADIOACTIVE SEED LOCALIZATION Right  08/29/2017   Procedure: RIGHT BREAST LUMPECTOMY WITH RADIOACTIVE SEEDS LOCALIZATION;  Surgeon: Harriette Bouillon, MD;  Location: [facility] OR;  Service: General;  Laterality: Right;   CESAREAN SECTION  1989   CHOLECYSTECTOMY  20 yrs ago   COLECTOMY  2011   COLONOSCOPY     COLONOSCOPY N/A 09/05/2018   Procedure: COLONOSCOPY;  Surgeon: Willis Modena, MD;  Location: [facility];  Service: Endoscopy;  Laterality: N/A;   COLONOSCOPY WITH PROPOFOL Bilateral 05/04/2022   Procedure: COLONOSCOPY WITH PROPOFOL;  Surgeon: Willis Modena, MD;  Location: [facility];  Service: Gastroenterology;  Laterality: Bilateral;   DILATATION & CURETTAGE/HYSTEROSCOPY WITH MYOSURE N/A 05/06/2016   Procedure: DILATATION & CURETTAGE/HYSTEROSCOPY WITH MYOSURE WITH POLYPECTOMY;  Surgeon: Myna Hidalgo, DO;  Location: [facility];  Service: Gynecology;  Laterality: N/A;   ESOPHAGOGASTRODUODENOSCOPY (EGD) WITH PROPOFOL N/A 09/04/2013   Procedure: ESOPHAGOGASTRODUODENOSCOPY (EGD) WITH PROPOFOL;  Surgeon: Willis Modena, MD;  Location: [facility];  Service: Endoscopy;  Laterality: N/A;   ESOPHAGOGASTRODUODENOSCOPY (EGD) WITH PROPOFOL Bilateral 05/04/2022   Procedure: ESOPHAGOGASTRODUODENOSCOPY (EGD) WITH PROPOFOL;  Surgeon: Willis Modena, MD;  Location: [facility];  Service: Gastroenterology;  Laterality: Bilateral;   IR EMBO ART  VEN HEMORR LYMPH EXTRAV  INC GUIDE ROADMAPPING  06/30/2023   IR  INTRAVASCULAR ULTRASOUND NON CORONARY  12/23/2022   IR RADIOLOGIST EVAL & MGMT  09/21/2022   IR RADIOLOGIST EVAL & MGMT  10/06/2022   IR RADIOLOGIST EVAL & MGMT  11/02/2022   IR RADIOLOGIST EVAL & MGMT  01/31/2023   IR RADIOLOGIST EVAL & MGMT  05/31/2023   IR TIPS  12/23/2022   IR TIPS  06/30/2023   IR US GUIDE VASC ACCESS LEFT  06/30/2023   IR US GUIDE VASC ACCESS RIGHT  12/23/2022   IR US GUIDE VASC ACCESS RIGHT  06/30/2023   IR US GUIDE VASC ACCESS RIGHT  06/30/2023   KNEE ARTHROSCOPY  yrs ago   bilateral, one done x 1, one done twice    POLYPECTOMY  09/05/2018   Procedure: POLYPECTOMY;  Surgeon: Willis Modena, MD;  Location: [facility];  Service: Endoscopy;;   RADIOLOGY WITH ANESTHESIA N/A 12/23/2022   Procedure: TIPS;  Surgeon: Bennie Dallas, MD;  Location: [facility] OR;  Service: Radiology;  Laterality: N/A;   RIGHT HEART CATH N/A 05/19/2023   Procedure: RIGHT HEART CATH;  Surgeon: Dorthula Nettles, DO;  Location: [facility];  Service: Cardiovascular;  Laterality: N/A;   TIPS PROCEDURE N/A 06/30/2023   Procedure: TRANS-JUGULAR INTRAHEPATIC PORTAL SHUNT (TIPS);  Surgeon: Bennie Dallas, MD;  Location: [facility] OR;  Service: Radiology;  Laterality: N/A;    Social History   Socioeconomic History   Marital status: Married    Spouse name: Not on file   Number of children: Not on file   Years of education: Not on file   Highest education level: Not on file  Occupational History   Occupation: Scientist, product/process development  Tobacco Use   Smoking status: Former    Current packs/day: 0.00    Average packs/day: 1 pack/day for 3.0 years (3.0 ttl pk-yrs)    Types: Cigarettes    Start date: 09/20/1971    Quit date: 09/19/1974    Years since quitting: 48.8   Smokeless tobacco: Never  Vaping Use   Vaping status: Never Used  Substance and Sexual Activity   Alcohol use: Not Currently    Comment: occ   Drug use: No   Sexual activity: Not on file  Other Topics Concern   Not on file  Social History Narrative   Lives in [place] with her husband.  Instructor for early childhood development.     Social Determinants of Health   Financial Resource Strain: Not on file  Food Insecurity: No Food Insecurity (07/04/2023)   Hunger Vital Sign    Worried About Running Out of Food in the Last Year: Never true    Ran Out of Food in the Last Year: Never true  Transportation Needs: No Transportation Needs (07/04/2023)   PRAPARE - Administrator, Civil Service (Medical): No    Lack of Transportation (Non-Medical): No  Physical Activity:  Not on file  Stress: Not on file  Social Connections: Unknown (01/31/2022)   Received from [facility], [facility]   Social Network    Social Network: Not on file  Intimate Partner Violence: Not At Risk (07/04/2023)   Humiliation, Afraid, Rape, and Kick questionnaire    Fear of Current or Ex-Partner: No    Emotionally Abused: No    Physically Abused: No    Sexually Abused: No    Family History  Problem Relation Age of Onset   Heart failure Mother    Cancer Mother    Hyperlipidemia Mother    Congestive Heart Failure  Mother    Stroke Mother    Lung cancer Father    Cancer Father    Cancer Brother    Hyperlipidemia Brother    Stroke Other    Breast cancer Neg Hx     Current Outpatient Medications  Medication Sig Dispense Refill   albuterol (VENTOLIN HFA) 108 (90 Base) MCG/ACT inhaler Inhale 1-2 puffs into the lungs every 6 (six) hours as needed for wheezing or shortness of breath. (Patient not taking: Reported on 07/28/2023)     digoxin (LANOXIN) 0.25 MG tablet Take 1 tablet (0.25 mg total) by mouth daily. 30 tablet 0   lactulose (CHRONULAC) 10 GM/15ML solution Take 20 g by mouth 3 (three) times daily.     Lancets (ONETOUCH DELICA PLUS LANCET33G) MISC Apply topically 3 (three) times daily.     Lidocaine HCl-Benzyl Alcohol (SALONPAS LIDOCAINE PLUS) 4-10 % CREA Apply 1 Application topically daily as needed (knee pain). (Patient not taking: Reported on 07/28/2023)     metoprolol tartrate (LOPRESSOR) 25 MG tablet Take 1 tablet (25 mg total) by mouth 2 (two) times daily. 60 tablet 0   midodrine (PROAMATINE) 10 MG tablet Take 1 tablet (10 mg total) by mouth 3 (three) times daily. 270 tablet 2   Multiple Vitamins-Minerals (PRESERVISION AREDS 2) CAPS Take 1 capsule by mouth 2 (two) times daily.     ONETOUCH VERIO test strip 1 each 3 (three) times daily.     potassium chloride (MICRO-K) 10 MEQ CR capsule Take 10 capsules (100 mEq total) by mouth 2 (two) times daily. Take an extra 4  tablets when taking Metolazone. 700 capsule 5   Propylene Glycol (SYSTANE BALANCE) 0.6 % SOLN Place 1 drop into both eyes 2 (two) times daily.     spironolactone (ALDACTONE) 25 MG tablet Take 1 tablet (25 mg total) by mouth daily. 30 tablet 0   Torsemide 40 MG TABS Take 2 tablets ( 80 mg) daily ( monitor daily weights and take torsemide twice daily if gains more than 3 lbs in 1 day or 5 lbs in 1 week) 90 tablet 0   XIFAXAN 550 MG TABS tablet Take 550 mg by mouth 2 (two) times daily.     No current facility-administered medications for this visit.    Allergies  Allergen Reactions   Celebrex [Celecoxib] Other (See Comments)    "speech slurred" with celebrex   Shellfish Allergy Swelling and Rash    TINGLING AND SWELLING OF LIPS. LARGE WHELPS QUARTER-SIZE   Barium-Containing Compounds Other (See Comments)    TACHYCARDIA   Prednisone Other (See Comments)    TACHYCARDIA   Statins Other (See Comments)    AGGRAVATED FIBROMYALGIA   Fentanyl     Hallucinations    Ibuprofen Other (See Comments)    UNSPECIFIED SPECIFIC REACTION >> "DUE TO PLATELETS"   Sglt2 Inhibitors     Ozempic Severe gastroparesis    Tramadol     Hallucination    Tylenol [Acetaminophen] Other (See Comments)    UNSPECIFIED SPECIFIC REACTION >> "DUE TO PLATELETS"   Zetia [Ezetimibe]     Headache     REVIEW OF SYSTEMS:   [X]  denotes positive finding, [ ]  denotes negative finding Cardiac  Comments:  Chest pain or chest pressure:    Shortness of breath upon exertion:    Short of breath when lying flat:    Irregular heart rhythm:        Vascular    Pain in calf, thigh, or hip brought on by ambulation:  Pain in feet at night that wakes you up from your sleep:     Blood clot in your veins:    Leg swelling:         Pulmonary    Oxygen at home:    Productive cough:     Wheezing:         Neurologic    Sudden weakness in arms or legs:     Sudden numbness in arms or legs:     Sudden onset of difficulty  speaking or slurred speech:    Temporary loss of vision in one eye:     Problems with dizziness:         Gastrointestinal    Blood in stool:     Vomited blood:         Genitourinary    Burning when urinating:     Blood in urine:        Psychiatric    Major depression:         Hematologic    Bleeding problems:    Problems with blood clotting too easily:        Skin    Rashes or ulcers:        Constitutional    Fever or chills:      PHYSICAL EXAMINATION:  Vitals:   07/28/23 1315  BP: 121/67  Pulse: 82  Temp: 97.7 F (36.5 C)  SpO2: 95%  Weight: (!) 305 lb 8 oz (138.6 kg)  Height: 5\' 7"  (1.702 m)    General:  WDWN in NAD; vital signs documented above Gait: Not observed HENT: WNL, normocephalic Pulmonary: normal non-labored breathing , without Rales, rhonchi,  wheezing Cardiac: regular HR Abdomen: soft, NT, no masses Skin: without rashes Vascular Exam/Pulses: Palpable DP pulses Extremities: Significant hyperpigmentation and hyperkeratosis of bilateral lower legs; edema involves the feet and toes; no open wounds or weeping Musculoskeletal: no muscle wasting or atrophy  Neurologic: A&O X 3 Psychiatric:  The pt has Normal affect.   Non-Invasive Vascular Imaging:   Right lower extremity venous reflux study negative for DVT, deep venous reflux, and superficial venous reflux    ASSESSMENT/PLAN:: 67 y.o. female here for evaluation of bilateral lower extremity edema and lymphedema  Ms. Hannah Khan is a 67 year old female previously diagnosed with lymphedema affecting bilateral lower extremities.  Patient has history of weeping and blistering of both legs however since being discharged from recent hospital admission she has noticed a tremendous improvement in the swelling of her legs.  She is currently active with home health for compression wraps and this will continue for 5 weeks.  She has also lost 30 pounds and has been able to exercise more.  Right lower  extremity venous reflux study was negative for DVT, deep venous reflux and superficial venous reflux.  I encouraged her to take advantage of this momentum to continue her weight loss and management of swelling.  She will need to be in compression socks when home health no longer wraps her legs.  I asked her to notify the office if she would like a referral to a lymphedema clinic to help with management of edema to avoid skin breakdown.  For now she will follow-up on an as-needed basis.   Hannah Rutter, PA-C [facility] [phone]  Clinic MD:   Hetty Blend on call

## 2023-07-29 DIAGNOSIS — I350 Nonrheumatic aortic (valve) stenosis: Secondary | ICD-10-CM | POA: Diagnosis not present

## 2023-07-29 DIAGNOSIS — E1122 Type 2 diabetes mellitus with diabetic chronic kidney disease: Secondary | ICD-10-CM | POA: Diagnosis not present

## 2023-07-29 DIAGNOSIS — D62 Acute posthemorrhagic anemia: Secondary | ICD-10-CM | POA: Diagnosis not present

## 2023-07-29 DIAGNOSIS — N1832 Chronic kidney disease, stage 3b: Secondary | ICD-10-CM | POA: Diagnosis not present

## 2023-07-29 DIAGNOSIS — L03311 Cellulitis of abdominal wall: Secondary | ICD-10-CM | POA: Diagnosis not present

## 2023-07-29 DIAGNOSIS — I130 Hypertensive heart and chronic kidney disease with heart failure and stage 1 through stage 4 chronic kidney disease, or unspecified chronic kidney disease: Secondary | ICD-10-CM | POA: Diagnosis not present

## 2023-07-29 DIAGNOSIS — I5033 Acute on chronic diastolic (congestive) heart failure: Secondary | ICD-10-CM | POA: Diagnosis not present

## 2023-07-29 DIAGNOSIS — K7581 Nonalcoholic steatohepatitis (NASH): Secondary | ICD-10-CM | POA: Diagnosis not present

## 2023-07-31 DIAGNOSIS — E119 Type 2 diabetes mellitus without complications: Secondary | ICD-10-CM | POA: Diagnosis not present

## 2023-07-31 DIAGNOSIS — E1122 Type 2 diabetes mellitus with diabetic chronic kidney disease: Secondary | ICD-10-CM | POA: Diagnosis not present

## 2023-07-31 DIAGNOSIS — N1832 Chronic kidney disease, stage 3b: Secondary | ICD-10-CM | POA: Diagnosis not present

## 2023-07-31 DIAGNOSIS — L03311 Cellulitis of abdominal wall: Secondary | ICD-10-CM | POA: Diagnosis not present

## 2023-07-31 DIAGNOSIS — K7460 Unspecified cirrhosis of liver: Secondary | ICD-10-CM | POA: Diagnosis not present

## 2023-07-31 DIAGNOSIS — I130 Hypertensive heart and chronic kidney disease with heart failure and stage 1 through stage 4 chronic kidney disease, or unspecified chronic kidney disease: Secondary | ICD-10-CM | POA: Diagnosis not present

## 2023-07-31 DIAGNOSIS — I5033 Acute on chronic diastolic (congestive) heart failure: Secondary | ICD-10-CM | POA: Diagnosis not present

## 2023-07-31 DIAGNOSIS — K7581 Nonalcoholic steatohepatitis (NASH): Secondary | ICD-10-CM | POA: Diagnosis not present

## 2023-07-31 DIAGNOSIS — I350 Nonrheumatic aortic (valve) stenosis: Secondary | ICD-10-CM | POA: Diagnosis not present

## 2023-07-31 DIAGNOSIS — I10 Essential (primary) hypertension: Secondary | ICD-10-CM | POA: Diagnosis not present

## 2023-07-31 DIAGNOSIS — D62 Acute posthemorrhagic anemia: Secondary | ICD-10-CM | POA: Diagnosis not present

## 2023-07-31 DIAGNOSIS — Z6841 Body Mass Index (BMI) 40.0 and over, adult: Secondary | ICD-10-CM | POA: Diagnosis not present

## 2023-08-01 DIAGNOSIS — I5033 Acute on chronic diastolic (congestive) heart failure: Secondary | ICD-10-CM | POA: Diagnosis not present

## 2023-08-01 DIAGNOSIS — I130 Hypertensive heart and chronic kidney disease with heart failure and stage 1 through stage 4 chronic kidney disease, or unspecified chronic kidney disease: Secondary | ICD-10-CM | POA: Diagnosis not present

## 2023-08-01 DIAGNOSIS — D62 Acute posthemorrhagic anemia: Secondary | ICD-10-CM | POA: Diagnosis not present

## 2023-08-01 DIAGNOSIS — I350 Nonrheumatic aortic (valve) stenosis: Secondary | ICD-10-CM | POA: Diagnosis not present

## 2023-08-01 DIAGNOSIS — L03311 Cellulitis of abdominal wall: Secondary | ICD-10-CM | POA: Diagnosis not present

## 2023-08-01 DIAGNOSIS — N1832 Chronic kidney disease, stage 3b: Secondary | ICD-10-CM | POA: Diagnosis not present

## 2023-08-01 DIAGNOSIS — E1122 Type 2 diabetes mellitus with diabetic chronic kidney disease: Secondary | ICD-10-CM | POA: Diagnosis not present

## 2023-08-01 DIAGNOSIS — K7581 Nonalcoholic steatohepatitis (NASH): Secondary | ICD-10-CM | POA: Diagnosis not present

## 2023-08-02 ENCOUNTER — Ambulatory Visit (HOSPITAL_COMMUNITY)
Admission: RE | Admit: 2023-08-02 | Discharge: 2023-08-02 | Disposition: A | Discharge status: Home / Self Care | Payer: Medicare PPO | Source: Ambulatory Visit | Attending: Cardiology | Admitting: Cardiology

## 2023-08-02 ENCOUNTER — Encounter: Payer: Self-pay | Admitting: Interventional Radiology

## 2023-08-02 ENCOUNTER — Other Ambulatory Visit: Payer: Self-pay | Admitting: Interventional Radiology

## 2023-08-02 ENCOUNTER — Encounter (HOSPITAL_COMMUNITY): Payer: Self-pay | Admitting: Cardiology

## 2023-08-02 VITALS — BP 128/62 | HR 85 | Wt 310.4 lb

## 2023-08-02 DIAGNOSIS — K7460 Unspecified cirrhosis of liver: Secondary | ICD-10-CM

## 2023-08-02 DIAGNOSIS — E1122 Type 2 diabetes mellitus with diabetic chronic kidney disease: Secondary | ICD-10-CM | POA: Diagnosis not present

## 2023-08-02 DIAGNOSIS — I890 Lymphedema, not elsewhere classified: Secondary | ICD-10-CM | POA: Insufficient documentation

## 2023-08-02 DIAGNOSIS — I4819 Other persistent atrial fibrillation: Secondary | ICD-10-CM | POA: Diagnosis not present

## 2023-08-02 DIAGNOSIS — N1832 Chronic kidney disease, stage 3b: Secondary | ICD-10-CM | POA: Insufficient documentation

## 2023-08-02 DIAGNOSIS — K7581 Nonalcoholic steatohepatitis (NASH): Secondary | ICD-10-CM | POA: Insufficient documentation

## 2023-08-02 DIAGNOSIS — G4733 Obstructive sleep apnea (adult) (pediatric): Secondary | ICD-10-CM | POA: Diagnosis not present

## 2023-08-02 DIAGNOSIS — R55 Syncope and collapse: Secondary | ICD-10-CM | POA: Diagnosis not present

## 2023-08-02 DIAGNOSIS — I350 Nonrheumatic aortic (valve) stenosis: Secondary | ICD-10-CM | POA: Insufficient documentation

## 2023-08-02 DIAGNOSIS — I5032 Chronic diastolic (congestive) heart failure: Secondary | ICD-10-CM | POA: Diagnosis not present

## 2023-08-02 DIAGNOSIS — I4821 Permanent atrial fibrillation: Secondary | ICD-10-CM

## 2023-08-02 DIAGNOSIS — D696 Thrombocytopenia, unspecified: Secondary | ICD-10-CM | POA: Diagnosis not present

## 2023-08-02 DIAGNOSIS — Z87891 Personal history of nicotine dependence: Secondary | ICD-10-CM | POA: Diagnosis not present

## 2023-08-02 DIAGNOSIS — I130 Hypertensive heart and chronic kidney disease with heart failure and stage 1 through stage 4 chronic kidney disease, or unspecified chronic kidney disease: Secondary | ICD-10-CM | POA: Diagnosis not present

## 2023-08-02 DIAGNOSIS — N1831 Chronic kidney disease, stage 3a: Secondary | ICD-10-CM | POA: Diagnosis not present

## 2023-08-02 MED ORDER — TORSEMIDE 20 MG PO TABS: ORAL_TABLET | ORAL | 3 refills | Status: DC

## 2023-08-02 MED ORDER — SPIRONOLACTONE 50 MG PO TABS: 50.0000 mg | ORAL_TABLET | Freq: Every day | ORAL | 3 refills | Status: DC

## 2023-08-02 MED ORDER — DIGOXIN 125 MCG PO TABS: 125.0000 ug | ORAL_TABLET | Freq: Every day | ORAL | 11 refills | Status: DC

## 2023-08-02 NOTE — Patient Instructions (Addendum)
INCREASE Torsemide to 100 mg in the AM and 40 mg in the PM for 5 days, then resume normal dose of 100 mg daily thereafter  INCREASE Spironolactone to 50 mg daily  DECREASE Digoxin to 0.125 mg daily  Labs needed in one week  You have been referred to [facility] for Lymphedema management -they will be in contact with an appointment Address: [address] [place], [place] [address] Phone: [phone]  Your physician recommends that you schedule a follow-up appointment in: 1 month with Dr Gasper Lloyd   Do the following things EVERYDAY: Weigh yourself in the morning before breakfast. Write it down and keep it in a log. Take your medicines as prescribed Eat low salt foods--Limit salt (sodium) to 2000 mg per day.  Stay as active as you can everyday Limit all fluids for the day to less than 2 liters  At the [facility], you and your health needs are our priority. As part of our continuing mission to provide you with exceptional heart care, we have created designated Provider Care Teams. These Care Teams include your primary Cardiologist (physician) and Advanced Practice Providers (APPs- Physician Assistants and Nurse Practitioners) who all work together to provide you with the care you need, when you need it.   You may see any of the following providers on your designated Care Team at your next follow up: Dr Arvilla Meres Dr Marca Ancona Dr. Dorthula Nettles Dr. Clearnce Hasten Amy Filbert Schilder, NP Robbie Lis, [place] [facility] [place], [place] Brynda Peon, NP [place] Lee, NP Karle Plumber, PharmD   Please be sure to bring in all your medications bottles to every appointment.    Thank you for choosing [facility]    If you have any questions or concerns before your next appointment please send [place] a message through [place] or call our office at [phone].    TO LEAVE A MESSAGE FOR THE NURSE  SELECT OPTION 2, PLEASE LEAVE A MESSAGE INCLUDING: YOUR NAME DATE OF BIRTH CALL BACK NUMBER REASON FOR CALL**this is important as we prioritize the call backs  YOU WILL RECEIVE A CALL BACK THE SAME DAY AS LONG AS YOU CALL BEFORE 4:00 PM

## 2023-08-02 NOTE — Progress Notes (Signed)
ADVANCED HEART FAILURE CLINIC NOTE  Referring Physician: Darrow Bussing, MD  Primary Care: Darrow Bussing, MD Primary Cardiologist: Chilton Si, MD  HPI: Hannah Khan is a 67 y.o. female with HFpEF, morbid obesity, obstructive sleep apnea, NASH/cirrhosis, thrombocytopenia, type 2 diabetes, aortic stenosis, vasovagal syncope, chronic atrial fibrillation presenting today to establish care.  Her cardiac history dates back to June 2021 when she was diagnosed with atrial fibrillation.  At that time she decided to continue with medical management after seeing Dr. Ladona Ridgel.  She is admitted in May 2022 with atrial fibrillation and hepatic encephalopathy.  Recurrent admission and 7/22 with acute on chronic HFpEF due to dietary indiscretion.  At that time she was started on Farxiga, Bumex 2 mg daily and metolazone as needed.  She has been unable to start spironolactone due to symptomatic hypotension.  Due to her underlying cirrhosis she was planned to undergo TIPS on April 2024 but the procedure was aborted due to a right atrial pressure of 23 mmHg. She presented previously for optimization prior to TIPS. After diuresis, we repeated a RHC which demonstrated improvement in RA pressures. Unfortunately, TIPS complicated by hemoperitoneum. Today she presents for follow up.   Interval hx:  On exam Ms. [place] has 3+ pitting edema to the thighs. She is wearing compression stockings daily with only some improvement in lower extremity edema. She becomes quickly short of breath and requires a wheelchair or walker for walking >72ft. Currently taking torsemide 80mg  daily.    Past Medical History:  Diagnosis Date   Anemia    hx of   Aortic stenosis 02/2023   Echo 03/07/23 LVEF 55-60%, mild to moderate AS   Arthritis    knees   CHF (congestive heart failure) (HCC)    chronic diastolic   Diabetes mellitus without complication (HCC)    type 2   Dyspnea    Dysrhythmia    Fibromyalgia    Gastric varices     large   H/O transfusion of platelets    Heart murmur    never has caused any problems   Hx of colonic polyps    s/p partial colectomy   Hyperlipidemia    Hypertension    Joint pain    in knees and back spasms   Left leg swelling    wear compression hose   Liver cirrhosis secondary to NASH (nonalcoholic steatohepatitis) (HCC) dx nov 2014   had enlarged spleen also    Morbid obesity (HCC)    Persistent atrial fibrillation (HCC)    Pneumonia 10/2016   x 5   PONV (postoperative nausea and vomiting)    in past none recent   Portal hypertension (HCC)    Sleep apnea    uses cpap, setting varies between 14-20   Spleen enlarged    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 [facility])     Current Outpatient Medications  Medication Sig Dispense Refill   albuterol (VENTOLIN HFA) 108 (90 Base) MCG/ACT inhaler Inhale 1-2 puffs into the lungs every 6 (six) hours as needed for wheezing or shortness of breath. (Patient not taking: Reported on 07/28/2023)     digoxin (LANOXIN) 0.25 MG tablet Take 1 tablet (0.25 mg total) by mouth daily. 30 tablet 0   lactulose (CHRONULAC) 10 GM/15ML solution Take 20 g by mouth 3 (three) times daily.     Lancets (ONETOUCH DELICA PLUS LANCET33G) MISC Apply topically 3 (three) times daily.     Lidocaine HCl-Benzyl Alcohol (SALONPAS LIDOCAINE PLUS) 4-10 % CREA Apply 1  Application topically daily as needed (knee pain). (Patient not taking: Reported on 07/28/2023)     metoprolol tartrate (LOPRESSOR) 25 MG tablet Take 1 tablet (25 mg total) by mouth 2 (two) times daily. 60 tablet 0   midodrine (PROAMATINE) 10 MG tablet Take 1 tablet (10 mg total) by mouth 3 (three) times daily. 270 tablet 2   Multiple Vitamins-Minerals (PRESERVISION AREDS 2) CAPS Take 1 capsule by mouth 2 (two) times daily.     ONETOUCH VERIO test strip 1 each 3 (three) times daily.     potassium chloride (MICRO-K) 10 MEQ CR capsule Take 10 capsules (100 mEq total) by mouth 2 (two) times daily. Take an extra 4  tablets when taking Metolazone. 700 capsule 5   Propylene Glycol (SYSTANE BALANCE) 0.6 % SOLN Place 1 drop into both eyes 2 (two) times daily.     spironolactone (ALDACTONE) 25 MG tablet Take 1 tablet (25 mg total) by mouth daily. 30 tablet 0   Torsemide 40 MG TABS Take 2 tablets ( 80 mg) daily ( monitor daily weights and take torsemide twice daily if gains more than 3 lbs in 1 day or 5 lbs in 1 week) 90 tablet 0   XIFAXAN 550 MG TABS tablet Take 550 mg by mouth 2 (two) times daily.     No current facility-administered medications for this visit.    Allergies  Allergen Reactions   Celebrex [Celecoxib] Other (See Comments)    "speech slurred" with celebrex   Shellfish Allergy Swelling and Rash    TINGLING AND SWELLING OF LIPS. LARGE WHELPS QUARTER-SIZE   Barium-Containing Compounds Other (See Comments)    TACHYCARDIA   Prednisone Other (See Comments)    TACHYCARDIA   Statins Other (See Comments)    AGGRAVATED FIBROMYALGIA   Fentanyl     Hallucinations    Ibuprofen Other (See Comments)    UNSPECIFIED SPECIFIC REACTION >> "DUE TO PLATELETS"   Sglt2 Inhibitors     Ozempic Severe gastroparesis    Tramadol     Hallucination    Tylenol [Acetaminophen] Other (See Comments)    UNSPECIFIED SPECIFIC REACTION >> "DUE TO PLATELETS"   Zetia [Ezetimibe]     Headache      Social History   Socioeconomic History   Marital status: Married    Spouse name: Not on file   Number of children: Not on file   Years of education: Not on file   Highest education level: Not on file  Occupational History   Occupation: Scientist, product/process development  Tobacco Use   Smoking status: Former    Current packs/day: 0.00    Average packs/day: 1 pack/day for 3.0 years (3.0 ttl pk-yrs)    Types: Cigarettes    Start date: 09/20/1971    Quit date: 09/19/1974    Years since quitting: 48.9   Smokeless tobacco: Never  Vaping Use   Vaping status: Never Used  Substance and Sexual Activity   Alcohol use: Not Currently     Comment: occ   Drug use: No   Sexual activity: Not on file  Other Topics Concern   Not on file  Social History Narrative   Lives in [place] with her husband.  Instructor for early childhood development.     Social Determinants of Health   Financial Resource Strain: Not on file  Food Insecurity: No Food Insecurity (07/04/2023)   Hunger Vital Sign    Worried About Running Out of Food in the Last Year: Never true  Ran Out of Food in the Last Year: Never true  Transportation Needs: No Transportation Needs (07/04/2023)   PRAPARE - Administrator, Civil Service (Medical): No    Lack of Transportation (Non-Medical): No  Physical Activity: Not on file  Stress: Not on file  Social Connections: Unknown (01/31/2022)   Received from [facility]   Social Network    Social Network: Not on file  Intimate Partner Violence: Not At Risk (07/04/2023)   Humiliation, Afraid, Rape, and Kick questionnaire    Fear of Current or Ex-Partner: No    Emotionally Abused: No    Physically Abused: No    Sexually Abused: No      Family History  Problem Relation Age of Onset   Heart failure Mother    Cancer Mother    Hyperlipidemia Mother    Congestive Heart Failure Mother    Stroke Mother    Lung cancer Father    Cancer Father    Cancer Brother    Hyperlipidemia Brother    Stroke Other    Breast cancer Neg Hx     PHYSICAL EXAM: Vitals:   08/02/23 0936  BP: 128/62  Pulse: 85  SpO2: 99%   GENERAL: Well nourished, well developed, and in no apparent distress at rest.  HEENT: Negative for arcus senilis or xanthelasma. There is no scleral icterus.  The mucous membranes are pink and moist.   NECK: Supple, No masses. Normal carotid upstrokes without bruits. No masses or thyromegaly.    CHEST: There are no chest wall deformities. There is no chest wall tenderness. Respirations are unlabored.  Lungs- CTA B/L CARDIAC:  JVP: 7 cm          Normal rate with regular  rhythm. No murmurs, rubs or gallops.  Pulses are 2+ and symmetrical in upper and lower extremities. 3+ edema.  ABDOMEN: Soft, non-tender, non-distended. There are no masses or hepatomegaly. There are normal bowel sounds.  EXTREMITIES: Warm and well perfused with no cyanosis, clubbing.  LYMPHATIC: No axillary or supraclavicular lymphadenopathy.  NEUROLOGIC: Patient is oriented x3 with no focal or lateralizing neurologic deficits.  PSYCH: Patients affect is appropriate, there is no evidence of anxiety or depression.  SKIN: Warm and dry; no lesions or wounds.    DATA REVIEW  ECG: 04/06/23: atrial fibrillation  as per my personal interpretation  ECHO: 03/07/23: LVEF 65%, normal RV function; RV is dilated with septal flattening as per my personal interpretation  CATH: HEMODYNAMICS: RA:                  10 mmHg (mean) RV:                  34/10 mmHg PA:                  37/18 mmHg (28 mean) PCWP:            23 mmHg (mean)                                      Estimated Fick CO/CI   8.5 L/min, 3.4 L/min/m2 Thermodilution CO/CI   9 L/min, 3. 6 L/min/m2  TPG                 5  mmHg                                              PVR                 0.5 Wood Units  PAPi                2.4     ASSESSMENT & PLAN:  Heart failure with preserved ejection fraction -RHC as noted above -Unsuccessful TIPS -Now on torsemide 80mg  daily & spironolactone 25mg  daily but severely hypervolemic. Increase torsemide to 100mg  in the morning and 40mg  in the evening for 5 days. Will continue 100mg  daily afterwards. Increase spironolactone to 50mg  daily. BMP/BNP today with repeat labs next week.   2. NASH Cirrhosis - Child pugh B, MELD 15 - Diagnosed in 2009 - Hx of esophageal varices - x1 admission for hepatic encephalopathy with lactulose/rifaximin.  -Admitted for TIPS on 06/30/23 which was unsuccessful; performed baloon-occluded retrograde transvenous  obliteration to treat gastric varices. Post-op course was complicated by hemoperitoneum and afib w/ RVR.   3. Atrial fibrillation -EKG with atrial fibrillation. -Anticoagulation being held due to persistent thrombocytopenia from underlying cirrhosis -Will continue rate control strategy for the time being; has been doing very well on diltiazem.  Will continue for the time being. -Rate controlled today; doing well.   4. Thrombocytopenia - Secondary to cirrhosis; plt count 20-30K for the past month.  - Reviewed plt count for hospitalization; stable thrombocytopenia.   5. CKDIIIB - Baseline sCr around 1.5-1.3 - Repeat BMP today; increase spiro.   6. Chronic lymphedema - Will refer to lymphedema clinic and place order for device.   I spent 40 minutes caring for this patient today including face to face time, ordering and reviewing labs, reviewing records from hospitalization (TIPS procedure), discussing management of chronic lymphedema / life expectancy discussion regarding her significant comorbid conditions , seeing the patient, documenting in the record, and arranging follow ups.   Fallon Haecker Advanced Heart Failure Mechanical Circulatory Support

## 2023-08-08 DIAGNOSIS — N1832 Chronic kidney disease, stage 3b: Secondary | ICD-10-CM | POA: Diagnosis not present

## 2023-08-08 DIAGNOSIS — I130 Hypertensive heart and chronic kidney disease with heart failure and stage 1 through stage 4 chronic kidney disease, or unspecified chronic kidney disease: Secondary | ICD-10-CM | POA: Diagnosis not present

## 2023-08-08 DIAGNOSIS — L03311 Cellulitis of abdominal wall: Secondary | ICD-10-CM | POA: Diagnosis not present

## 2023-08-08 DIAGNOSIS — D62 Acute posthemorrhagic anemia: Secondary | ICD-10-CM | POA: Diagnosis not present

## 2023-08-08 DIAGNOSIS — K7581 Nonalcoholic steatohepatitis (NASH): Secondary | ICD-10-CM | POA: Diagnosis not present

## 2023-08-08 DIAGNOSIS — E1122 Type 2 diabetes mellitus with diabetic chronic kidney disease: Secondary | ICD-10-CM | POA: Diagnosis not present

## 2023-08-08 DIAGNOSIS — I350 Nonrheumatic aortic (valve) stenosis: Secondary | ICD-10-CM | POA: Diagnosis not present

## 2023-08-08 DIAGNOSIS — I5033 Acute on chronic diastolic (congestive) heart failure: Secondary | ICD-10-CM | POA: Diagnosis not present

## 2023-08-09 ENCOUNTER — Emergency Department (HOSPITAL_BASED_OUTPATIENT_CLINIC_OR_DEPARTMENT_OTHER)
Admission: EM | Admit: 2023-08-09 | Discharge: 2023-08-09 | Disposition: A | Discharge status: Home / Self Care | Payer: Medicare PPO

## 2023-08-09 ENCOUNTER — Emergency Department (HOSPITAL_BASED_OUTPATIENT_CLINIC_OR_DEPARTMENT_OTHER): Payer: Medicare PPO

## 2023-08-09 ENCOUNTER — Other Ambulatory Visit: Payer: Self-pay

## 2023-08-09 ENCOUNTER — Encounter (HOSPITAL_BASED_OUTPATIENT_CLINIC_OR_DEPARTMENT_OTHER): Payer: Self-pay | Admitting: Emergency Medicine

## 2023-08-09 ENCOUNTER — Ambulatory Visit (HOSPITAL_COMMUNITY)
Admission: RE | Admit: 2023-08-09 | Discharge: 2023-08-09 | Disposition: A | Discharge status: Home / Self Care | Payer: Medicare PPO | Source: Ambulatory Visit | Attending: Cardiology | Admitting: Cardiology

## 2023-08-09 DIAGNOSIS — E041 Nontoxic single thyroid nodule: Secondary | ICD-10-CM | POA: Insufficient documentation

## 2023-08-09 DIAGNOSIS — S29012A Strain of muscle and tendon of back wall of thorax, initial encounter: Secondary | ICD-10-CM | POA: Insufficient documentation

## 2023-08-09 DIAGNOSIS — R42 Dizziness and giddiness: Secondary | ICD-10-CM | POA: Diagnosis not present

## 2023-08-09 DIAGNOSIS — R918 Other nonspecific abnormal finding of lung field: Secondary | ICD-10-CM | POA: Diagnosis not present

## 2023-08-09 DIAGNOSIS — S0990XA Unspecified injury of head, initial encounter: Secondary | ICD-10-CM | POA: Diagnosis not present

## 2023-08-09 DIAGNOSIS — I509 Heart failure, unspecified: Secondary | ICD-10-CM | POA: Insufficient documentation

## 2023-08-09 DIAGNOSIS — M8580 Other specified disorders of bone density and structure, unspecified site: Secondary | ICD-10-CM | POA: Diagnosis not present

## 2023-08-09 DIAGNOSIS — Y92410 Unspecified street and highway as the place of occurrence of the external cause: Secondary | ICD-10-CM | POA: Diagnosis not present

## 2023-08-09 DIAGNOSIS — I5032 Chronic diastolic (congestive) heart failure: Secondary | ICD-10-CM | POA: Insufficient documentation

## 2023-08-09 DIAGNOSIS — R0782 Intercostal pain: Secondary | ICD-10-CM | POA: Diagnosis not present

## 2023-08-09 DIAGNOSIS — I517 Cardiomegaly: Secondary | ICD-10-CM | POA: Diagnosis not present

## 2023-08-09 DIAGNOSIS — M47812 Spondylosis without myelopathy or radiculopathy, cervical region: Secondary | ICD-10-CM | POA: Diagnosis not present

## 2023-08-09 DIAGNOSIS — R519 Headache, unspecified: Secondary | ICD-10-CM | POA: Insufficient documentation

## 2023-08-09 DIAGNOSIS — E119 Type 2 diabetes mellitus without complications: Secondary | ICD-10-CM | POA: Diagnosis not present

## 2023-08-09 DIAGNOSIS — M546 Pain in thoracic spine: Secondary | ICD-10-CM | POA: Diagnosis not present

## 2023-08-09 DIAGNOSIS — M79605 Pain in left leg: Secondary | ICD-10-CM | POA: Diagnosis not present

## 2023-08-09 DIAGNOSIS — S161XXA Strain of muscle, fascia and tendon at neck level, initial encounter: Secondary | ICD-10-CM | POA: Insufficient documentation

## 2023-08-09 DIAGNOSIS — M25562 Pain in left knee: Secondary | ICD-10-CM | POA: Insufficient documentation

## 2023-08-09 DIAGNOSIS — E042 Nontoxic multinodular goiter: Secondary | ICD-10-CM | POA: Diagnosis not present

## 2023-08-09 DIAGNOSIS — R079 Chest pain, unspecified: Secondary | ICD-10-CM | POA: Diagnosis not present

## 2023-08-09 DIAGNOSIS — S29019A Strain of muscle and tendon of unspecified wall of thorax, initial encounter: Secondary | ICD-10-CM

## 2023-08-09 DIAGNOSIS — M542 Cervicalgia: Secondary | ICD-10-CM | POA: Diagnosis not present

## 2023-08-09 DIAGNOSIS — S199XXA Unspecified injury of neck, initial encounter: Secondary | ICD-10-CM | POA: Diagnosis not present

## 2023-08-09 DIAGNOSIS — M47814 Spondylosis without myelopathy or radiculopathy, thoracic region: Secondary | ICD-10-CM | POA: Diagnosis not present

## 2023-08-09 LAB — BASIC METABOLIC PANEL
Anion gap: 8 (ref 5–15)
BUN: 19 mg/dL (ref 8–23)
CO2: 24 mmol/L (ref 22–32)
Calcium: 9.3 mg/dL (ref 8.9–10.3)
Chloride: 103 mmol/L (ref 98–111)
Creatinine, Ser: 1.49 mg/dL — ABNORMAL HIGH (ref 0.44–1.00)
GFR, Estimated: 38 mL/min — ABNORMAL LOW (ref >60)
Glucose, Bld: 205 mg/dL — ABNORMAL HIGH (ref 70–99)
Potassium: 4.4 mmol/L (ref 3.5–5.1)
Sodium: 135 mmol/L (ref 135–145)

## 2023-08-09 LAB — BRAIN NATRIURETIC PEPTIDE: B Natriuretic Peptide: 194.9 pg/mL — ABNORMAL HIGH (ref 0.0–100.0)

## 2023-08-09 MED ORDER — ACETAMINOPHEN 325 MG PO TABS
650.0000 mg | ORAL_TABLET | Freq: Once | ORAL | Status: AC
  Administered 2023-08-09: 650 mg via ORAL
  Filled 2023-08-09: qty 2
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
## 2023-08-09 NOTE — Discharge Instructions (Signed)
Your CT scans and x-rays were unremarkable here today.  Please take Tylenol at home as needed for pain.  You may also try some Voltaren gel over-the-counter as well as lidocaine patches.  Your CT scan did show a thyroid nodule.  Please follow-up with your doctor to have an ultrasound of your thyroid performed as an outpatient.  Return to the ER for worsening symptoms.

## 2023-08-09 NOTE — ED Notes (Signed)
Patient transported to CT 
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
## 2023-08-10 DIAGNOSIS — E041 Nontoxic single thyroid nodule: Secondary | ICD-10-CM | POA: Diagnosis not present

## 2023-08-11 ENCOUNTER — Ambulatory Visit (HOSPITAL_COMMUNITY)
Admission: RE | Admit: 2023-08-11 | Discharge: 2023-08-11 | Disposition: A | Discharge status: Home / Self Care | Payer: Medicare PPO | Source: Ambulatory Visit | Attending: Interventional Radiology | Admitting: Interventional Radiology

## 2023-08-11 DIAGNOSIS — K7460 Unspecified cirrhosis of liver: Secondary | ICD-10-CM | POA: Diagnosis not present

## 2023-08-11 DIAGNOSIS — R161 Splenomegaly, not elsewhere classified: Secondary | ICD-10-CM | POA: Diagnosis not present

## 2023-08-11 DIAGNOSIS — L03116 Cellulitis of left lower limb: Secondary | ICD-10-CM | POA: Diagnosis not present

## 2023-08-11 DIAGNOSIS — K7581 Nonalcoholic steatohepatitis (NASH): Secondary | ICD-10-CM | POA: Insufficient documentation

## 2023-08-11 MED ORDER — SODIUM CHLORIDE (PF) 0.9 % IJ SOLN
INTRAMUSCULAR | Status: AC
Start: 2023-08-11 — End: ?
  Filled 2023-08-11: qty 50

## 2023-08-11 MED ORDER — IOHEXOL 350 MG/ML SOLN
80.0000 mL | Freq: Once | INTRAVENOUS | Status: AC | PRN
  Administered 2023-08-11: 80 mL via INTRAVENOUS

## 2023-08-16 DIAGNOSIS — E041 Nontoxic single thyroid nodule: Secondary | ICD-10-CM | POA: Diagnosis not present

## 2023-08-16 DIAGNOSIS — Z23 Encounter for immunization: Secondary | ICD-10-CM | POA: Diagnosis not present

## 2023-08-16 DIAGNOSIS — Z6841 Body Mass Index (BMI) 40.0 and over, adult: Secondary | ICD-10-CM | POA: Diagnosis not present

## 2023-08-16 DIAGNOSIS — Z01818 Encounter for other preprocedural examination: Secondary | ICD-10-CM | POA: Diagnosis not present

## 2023-08-21 ENCOUNTER — Ambulatory Visit
Admission: RE | Admit: 2023-08-21 | Discharge: 2023-08-21 | Disposition: A | Discharge status: Home / Self Care | Payer: Medicare PPO | Source: Ambulatory Visit | Attending: Student | Admitting: Student

## 2023-08-21 DIAGNOSIS — K7460 Unspecified cirrhosis of liver: Secondary | ICD-10-CM | POA: Diagnosis not present

## 2023-08-21 DIAGNOSIS — K766 Portal hypertension: Secondary | ICD-10-CM | POA: Diagnosis not present

## 2023-08-21 DIAGNOSIS — K7581 Nonalcoholic steatohepatitis (NASH): Secondary | ICD-10-CM | POA: Diagnosis not present

## 2023-08-21 HISTORY — PX: IR RADIOLOGIST EVAL & MGMT: IMG5224
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
## 2023-08-22 ENCOUNTER — Other Ambulatory Visit: Payer: Self-pay | Admitting: Family Medicine

## 2023-08-22 DIAGNOSIS — E041 Nontoxic single thyroid nodule: Secondary | ICD-10-CM

## 2023-08-23 DIAGNOSIS — Z23 Encounter for immunization: Secondary | ICD-10-CM | POA: Diagnosis not present

## 2023-08-25 ENCOUNTER — Ambulatory Visit
Admission: RE | Admit: 2023-08-25 | Discharge: 2023-08-25 | Disposition: A | Discharge status: Home / Self Care | Payer: Medicare PPO | Source: Ambulatory Visit | Attending: Family Medicine | Admitting: Family Medicine

## 2023-08-25 DIAGNOSIS — E041 Nontoxic single thyroid nodule: Secondary | ICD-10-CM

## 2023-08-27 ENCOUNTER — Other Ambulatory Visit (HOSPITAL_COMMUNITY): Payer: Self-pay | Admitting: Cardiology

## 2023-08-29 DIAGNOSIS — I350 Nonrheumatic aortic (valve) stenosis: Secondary | ICD-10-CM | POA: Diagnosis not present

## 2023-08-29 DIAGNOSIS — E1122 Type 2 diabetes mellitus with diabetic chronic kidney disease: Secondary | ICD-10-CM | POA: Diagnosis not present

## 2023-08-29 DIAGNOSIS — D62 Acute posthemorrhagic anemia: Secondary | ICD-10-CM | POA: Diagnosis not present

## 2023-08-29 DIAGNOSIS — I5033 Acute on chronic diastolic (congestive) heart failure: Secondary | ICD-10-CM | POA: Diagnosis not present

## 2023-08-29 DIAGNOSIS — L03311 Cellulitis of abdominal wall: Secondary | ICD-10-CM | POA: Diagnosis not present

## 2023-08-29 DIAGNOSIS — K7581 Nonalcoholic steatohepatitis (NASH): Secondary | ICD-10-CM | POA: Diagnosis not present

## 2023-08-29 DIAGNOSIS — I130 Hypertensive heart and chronic kidney disease with heart failure and stage 1 through stage 4 chronic kidney disease, or unspecified chronic kidney disease: Secondary | ICD-10-CM | POA: Diagnosis not present

## 2023-08-29 DIAGNOSIS — N1832 Chronic kidney disease, stage 3b: Secondary | ICD-10-CM | POA: Diagnosis not present

## 2023-08-30 NOTE — Progress Notes (Signed)
ADVANCED HEART FAILURE CLINIC NOTE  Referring Physician: Darrow Bussing, MD  Primary Care: Darrow Bussing, MD Primary Cardiologist: Chilton Si, MD  HPI: Hannah Khan is a 67 y.o. female with HFpEF, morbid obesity, obstructive sleep apnea, NASH/cirrhosis, thrombocytopenia, type 2 diabetes, aortic stenosis, vasovagal syncope, chronic atrial fibrillation presenting today to establish care.  Her cardiac history dates back to June 2021 when she was diagnosed with atrial fibrillation.  At that time she decided to continue with medical management after seeing Dr. Ladona Ridgel.  She is admitted in May 2022 with atrial fibrillation and hepatic encephalopathy.  Recurrent admission and 7/22 with acute on chronic HFpEF due to dietary indiscretion.  At that time she was started on Farxiga, Bumex 2 mg daily and metolazone as needed.  She has been unable to start spironolactone due to symptomatic hypotension.  Due to her underlying cirrhosis she was planned to undergo TIPS on April 2024 but the procedure was aborted due to a right atrial pressure of 23 mmHg. She presented previously for optimization prior to TIPS. After diuresis, we repeated a RHC which demonstrated improvement in RA pressures. Unfortunately, TIPS complicated by hemoperitoneum. Today she presents for follow up.   Interval hx:  On exam Ms. Quinones has 3+ pitting edema to the thighs. She is wearing compression stockings daily with only some improvement in lower extremity edema. She becomes quickly short of breath and requires a wheelchair or walker for walking >49ft. Currently taking torsemide 80mg  daily.    Past Medical History:  Diagnosis Date   Anemia    hx of   Aortic stenosis 02/2023   Echo 03/07/23 LVEF 55-60%, mild to moderate AS   Arthritis    knees   CHF (congestive heart failure) (HCC)    chronic diastolic   Diabetes mellitus without complication (HCC)    type 2   Dyspnea    Dysrhythmia    Fibromyalgia    Gastric varices     large   H/O transfusion of platelets    Heart murmur    never has caused any problems   Hx of colonic polyps    s/p partial colectomy   Hyperlipidemia    Hypertension    Joint pain    in knees and back spasms   Left leg swelling    wear compression hose   Liver cirrhosis secondary to NASH (nonalcoholic steatohepatitis) (HCC) dx nov 2014   had enlarged spleen also    Morbid obesity (HCC)    Persistent atrial fibrillation (HCC)    Pneumonia 10/2016   x 5   PONV (postoperative nausea and vomiting)    in past none recent   Portal hypertension (HCC)    Sleep apnea    uses cpap, setting varies between 14-20   Spleen enlarged    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 [facility])     Current Outpatient Medications  Medication Sig Dispense Refill   albuterol (VENTOLIN HFA) 108 (90 Base) MCG/ACT inhaler Inhale 1-2 puffs into the lungs every 6 (six) hours as needed for wheezing or shortness of breath.     digoxin (LANOXIN) 0.125 MG tablet Take 1 tablet (125 mcg total) by mouth daily. 30 tablet 11   gatifloxacin (ZYMAXID) 0.5 % SOLN Place 1 drop into the left eye 4 (four) times daily.     ketorolac (ACULAR) 0.5 % ophthalmic solution Place 1 drop into the left eye 4 (four) times daily.     lactulose (CHRONULAC) 10 GM/15ML solution Take 20 g by mouth  3 (three) times daily.     Lancets (ONETOUCH DELICA PLUS LANCET33G) MISC Apply topically 3 (three) times daily.     Lidocaine HCl-Benzyl Alcohol (SALONPAS LIDOCAINE PLUS) 4-10 % CREA Apply 1 Application topically daily as needed (knee pain).     metoprolol tartrate (LOPRESSOR) 25 MG tablet Take 1 tablet (25 mg total) by mouth 2 (two) times daily. 60 tablet 0   midodrine (PROAMATINE) 10 MG tablet Take 1 tablet (10 mg total) by mouth 3 (three) times daily. 270 tablet 2   Multiple Vitamins-Minerals (PRESERVISION AREDS 2) CAPS Take 1 capsule by mouth 2 (two) times daily.     ONETOUCH VERIO test strip 1 each 3 (three) times daily.     potassium  chloride (MICRO-K) 10 MEQ CR capsule Take 10 capsules (100 mEq total) by mouth 2 (two) times daily. Take an extra 4 tablets when taking Metolazone. 700 capsule 5   prednisoLONE acetate (PRED FORTE) 1 % ophthalmic suspension Place 1 drop into the left eye 4 (four) times daily.     Propylene Glycol (SYSTANE BALANCE) 0.6 % SOLN Place 1 drop into both eyes 2 (two) times daily.     spironolactone (ALDACTONE) 50 MG tablet Take 1 tablet (50 mg total) by mouth daily. 90 tablet 3   torsemide (DEMADEX) 20 MG tablet Take 5 tablets (100 mg total) by mouth every morning for 5 days, THEN 2 tablets (40 mg total) every evening for 5 days, THEN 5 tablets (100 mg total) daily. 180 tablet 3   XIFAXAN 550 MG TABS tablet Take 550 mg by mouth 2 (two) times daily.     No current facility-administered medications for this visit.    Allergies  Allergen Reactions   Celebrex [Celecoxib] Other (See Comments)    "speech slurred" with celebrex   Shellfish Allergy Swelling and Rash    TINGLING AND SWELLING OF LIPS. LARGE WHELPS QUARTER-SIZE   Barium-Containing Compounds Other (See Comments)    TACHYCARDIA   Prednisone Other (See Comments)    TACHYCARDIA   Statins Other (See Comments)    AGGRAVATED FIBROMYALGIA   Fentanyl     Hallucinations    Ibuprofen Other (See Comments)    UNSPECIFIED SPECIFIC REACTION >> "DUE TO PLATELETS"   Sglt2 Inhibitors     Ozempic Severe gastroparesis    Tramadol     Hallucination    Tylenol [Acetaminophen] Other (See Comments)    UNSPECIFIED SPECIFIC REACTION >> "DUE TO PLATELETS"   Zetia [Ezetimibe]     Headache      Social History   Socioeconomic History   Marital status: Married    Spouse name: Not on file   Number of children: Not on file   Years of education: Not on file   Highest education level: Not on file  Occupational History   Occupation: Scientist, product/process development  Tobacco Use   Smoking status: Former    Current packs/day: 0.00    Average packs/day: 1 pack/day for  3.0 years (3.0 ttl pk-yrs)    Types: Cigarettes    Start date: 09/20/1971    Quit date: 09/19/1974    Years since quitting: 48.9   Smokeless tobacco: Never  Vaping Use   Vaping status: Never Used  Substance and Sexual Activity   Alcohol use: Not Currently    Comment: occ   Drug use: No   Sexual activity: Not on file  Other Topics Concern   Not on file  Social History Narrative   Lives in [place] with her  husband.  Instructor for early childhood development.     Social Determinants of Health   Financial Resource Strain: Not on file  Food Insecurity: No Food Insecurity (07/04/2023)   Hunger Vital Sign    Worried About Running Out of Food in the Last Year: Never true    Ran Out of Food in the Last Year: Never true  Transportation Needs: No Transportation Needs (07/04/2023)   PRAPARE - Administrator, Civil Service (Medical): No    Lack of Transportation (Non-Medical): No  Physical Activity: Not on file  Stress: Not on file  Social Connections: Unknown (01/31/2022)   Received from [facility]   Social Network    Social Network: Not on file  Intimate Partner Violence: Not At Risk (07/04/2023)   Humiliation, Afraid, Rape, and Kick questionnaire    Fear of Current or Ex-Partner: No    Emotionally Abused: No    Physically Abused: No    Sexually Abused: No      Family History  Problem Relation Age of Onset   Heart failure Mother    Cancer Mother    Hyperlipidemia Mother    Congestive Heart Failure Mother    Stroke Mother    Lung cancer Father    Cancer Father    Cancer Brother    Hyperlipidemia Brother    Stroke Other    Breast cancer Neg Hx     PHYSICAL EXAM: There were no vitals filed for this visit. GENERAL: Well nourished, well developed, and in no apparent distress at rest.  HEENT: Negative for arcus senilis or xanthelasma. There is no scleral icterus.  The mucous membranes are pink and moist.   NECK: Supple, No masses. Normal  carotid upstrokes without bruits. No masses or thyromegaly.    CHEST: There are no chest wall deformities. There is no chest wall tenderness. Respirations are unlabored.  Lungs- *** CARDIAC:  JVP: *** cm          Normal rate with regular rhythm. No murmurs, rubs or gallops.  Pulses are 2+ and symmetrical in upper and lower extremities. *** edema.  ABDOMEN: Soft, non-tender, non-distended. There are no masses or hepatomegaly. There are normal bowel sounds.  EXTREMITIES: Warm and well perfused with no cyanosis, clubbing.  LYMPHATIC: No axillary or supraclavicular lymphadenopathy.  NEUROLOGIC: Patient is oriented x3 with no focal or lateralizing neurologic deficits.  PSYCH: Patients affect is appropriate, there is no evidence of anxiety or depression.  SKIN: Warm and dry; no lesions or wounds.     DATA REVIEW  ECG: 04/06/23: atrial fibrillation  as per my personal interpretation  ECHO: 03/07/23: LVEF 65%, normal RV function; RV is dilated with septal flattening as per my personal interpretation  CATH: HEMODYNAMICS: RA:                  10 mmHg (mean) RV:                  34/10 mmHg PA:                  37/18 mmHg (28 mean) PCWP:            23 mmHg (mean)                                      Estimated Fick CO/CI   8.5 L/min, 3.4 L/min/m2 Thermodilution CO/CI  9 L/min, 3. 6 L/min/m2                                                TPG                 5  mmHg                                              PVR                 0.5 Wood Units  PAPi                2.4     ASSESSMENT & PLAN:  Heart failure with preserved ejection fraction -RHC as noted above -Unsuccessful TIPS -Now on torsemide 80mg  daily & spironolactone 25mg  daily but severely hypervolemic. Increase torsemide to 100mg  in the morning and 40mg  in the evening for 5 days. Will continue 100mg  daily afterwards. Increase spironolactone to 50mg  daily. BMP/BNP today with repeat labs next week.   2. NASH Cirrhosis / portal HTN -  Child pugh B, MELD 15 - Diagnosed in 2009 - Hx of esophageal varices - x1 admission for hepatic encephalopathy with lactulose/rifaximin.  -Admitted for TIPS on 06/30/23 which was unsuccessful; performed baloon-occluded retrograde transvenous obliteration to treat gastric varices. Post-op course was complicated by hemoperitoneum and afib w/ RVR.  - Undergoing evaluation for partial splenic embolization for decreasing portal HTN  3. Atrial fibrillation -EKG with atrial fibrillation. -Anticoagulation being held due to persistent thrombocytopenia from underlying cirrhosis -Will continue rate control strategy for the time being; has been doing very well on diltiazem.  Will continue for the time being. -Rate controlled today; doing well.   4. Thrombocytopenia - Secondary to cirrhosis; plt count 20-30K for the past month.  - Reviewed plt count for hospitalization; stable thrombocytopenia.   5. CKDIIIB - Baseline sCr around 1.5-1.3 - Repeat BMP today; increase spiro.   6. Chronic lymphedema - Will refer to lymphedema clinic and place order for device.   7. Large gastroesophageal varices - undergoing evaluation by IR  -   I spent 40 minutes caring for this patient today including face to face time, ordering and reviewing labs, reviewing records from hospitalization (TIPS procedure), discussing management of chronic lymphedema / life expectancy discussion regarding her significant comorbid conditions , seeing the patient, documenting in the record, and arranging follow ups.   Ginnie Marich [facility] Mechanical Circulatory Support

## 2023-08-31 ENCOUNTER — Ambulatory Visit (HOSPITAL_COMMUNITY)
Admission: RE | Admit: 2023-08-31 | Discharge: 2023-08-31 | Disposition: A | Discharge status: Home / Self Care | Payer: Medicare PPO | Source: Ambulatory Visit | Attending: Cardiology | Admitting: Cardiology

## 2023-08-31 VITALS — BP 103/52 | HR 86 | Wt 302.6 lb

## 2023-08-31 DIAGNOSIS — E1122 Type 2 diabetes mellitus with diabetic chronic kidney disease: Secondary | ICD-10-CM | POA: Insufficient documentation

## 2023-08-31 DIAGNOSIS — N1832 Chronic kidney disease, stage 3b: Secondary | ICD-10-CM | POA: Insufficient documentation

## 2023-08-31 DIAGNOSIS — I350 Nonrheumatic aortic (valve) stenosis: Secondary | ICD-10-CM | POA: Diagnosis not present

## 2023-08-31 DIAGNOSIS — I130 Hypertensive heart and chronic kidney disease with heart failure and stage 1 through stage 4 chronic kidney disease, or unspecified chronic kidney disease: Secondary | ICD-10-CM | POA: Insufficient documentation

## 2023-08-31 DIAGNOSIS — I4819 Other persistent atrial fibrillation: Secondary | ICD-10-CM | POA: Diagnosis not present

## 2023-08-31 DIAGNOSIS — K7460 Unspecified cirrhosis of liver: Secondary | ICD-10-CM | POA: Diagnosis not present

## 2023-08-31 DIAGNOSIS — R55 Syncope and collapse: Secondary | ICD-10-CM | POA: Diagnosis not present

## 2023-08-31 DIAGNOSIS — I864 Gastric varices: Secondary | ICD-10-CM | POA: Diagnosis not present

## 2023-08-31 DIAGNOSIS — I890 Lymphedema, not elsewhere classified: Secondary | ICD-10-CM | POA: Diagnosis not present

## 2023-08-31 DIAGNOSIS — K766 Portal hypertension: Secondary | ICD-10-CM

## 2023-08-31 DIAGNOSIS — K7682 Hepatic encephalopathy: Secondary | ICD-10-CM | POA: Insufficient documentation

## 2023-08-31 DIAGNOSIS — D696 Thrombocytopenia, unspecified: Secondary | ICD-10-CM

## 2023-08-31 DIAGNOSIS — D6959 Other secondary thrombocytopenia: Secondary | ICD-10-CM | POA: Insufficient documentation

## 2023-08-31 DIAGNOSIS — I4821 Permanent atrial fibrillation: Secondary | ICD-10-CM

## 2023-08-31 DIAGNOSIS — K7581 Nonalcoholic steatohepatitis (NASH): Secondary | ICD-10-CM | POA: Diagnosis not present

## 2023-08-31 DIAGNOSIS — Z87891 Personal history of nicotine dependence: Secondary | ICD-10-CM | POA: Insufficient documentation

## 2023-08-31 DIAGNOSIS — I5032 Chronic diastolic (congestive) heart failure: Secondary | ICD-10-CM | POA: Diagnosis not present

## 2023-08-31 DIAGNOSIS — G4733 Obstructive sleep apnea (adult) (pediatric): Secondary | ICD-10-CM

## 2023-08-31 DIAGNOSIS — K661 Hemoperitoneum: Secondary | ICD-10-CM | POA: Insufficient documentation

## 2023-08-31 DIAGNOSIS — Z79899 Other long term (current) drug therapy: Secondary | ICD-10-CM | POA: Diagnosis not present

## 2023-08-31 LAB — BASIC METABOLIC PANEL
Anion gap: 10 (ref 5–15)
BUN: 19 mg/dL (ref 8–23)
CO2: 24 mmol/L (ref 22–32)
Calcium: 9.1 mg/dL (ref 8.9–10.3)
Chloride: 102 mmol/L (ref 98–111)
Creatinine, Ser: 1.61 mg/dL — ABNORMAL HIGH (ref 0.44–1.00)
GFR, Estimated: 35 mL/min — ABNORMAL LOW (ref >60)
Glucose, Bld: 149 mg/dL — ABNORMAL HIGH (ref 70–99)
Potassium: 3.8 mmol/L (ref 3.5–5.1)
Sodium: 136 mmol/L (ref 135–145)

## 2023-08-31 LAB — BRAIN NATRIURETIC PEPTIDE: B Natriuretic Peptide: 183.9 pg/mL — ABNORMAL HIGH (ref 0.0–100.0)

## 2023-08-31 MED ORDER — METOLAZONE 2.5 MG PO TABS: 2.5000 mg | ORAL_TABLET | ORAL | 0 refills | Status: DC | PRN

## 2023-08-31 NOTE — Patient Instructions (Addendum)
Medication Changes:  TAKE TORSEMIDE 100MG  IN THE MORNING AND 40MG  IN THE EVENING FOR THE NEXT 5-6 DAYS   IF THE FLUID VOLUME IS NOT IMPROVING PLEASE TAKE A DOSE OF METOLAZONE 2.5MG  AS NEEDED- WITH OF POTASSIUM- DO NOT TAKE THIS MEDICATION ON A REGULAR BASIS   Lab Work:  Labs done today, your results will be available in MyChart, we will contact you for abnormal readings.  THEN RETURN FOR LABS AS SCHEDULED IN 7-10 DAYS   Follow-Up in: 2 MONTHS AS SCHEDULED   At the [facility], you and your health needs are our priority. We have a designated team specialized in the treatment of Heart Failure. This Care Team includes your primary Heart Failure Specialized Cardiologist (physician), Advanced Practice Providers (APPs- Physician Assistants and Nurse Practitioners), and Pharmacist who all work together to provide you with the care you need, when you need it.   You may see any of the following providers on your designated Care Team at your next follow up:  Dr. Arvilla Meres Dr. Marca Ancona Dr. Dorthula Nettles Dr. Theresia Bough Tonye Becket, NP Robbie Lis, [place] [facility] [place], [place] Brynda Peon, NP [place] Lee, NP Karle Plumber, PharmD   Please be sure to bring in all your medications bottles to every appointment.   Need to Contact us:  If you have any questions or concerns before your next appointment please send [place] a message through [place] or call our office at [phone].    TO LEAVE A MESSAGE FOR THE NURSE SELECT OPTION 2, PLEASE LEAVE A MESSAGE INCLUDING: YOUR NAME DATE OF BIRTH CALL BACK NUMBER REASON FOR CALL**this is important as we prioritize the call backs  YOU WILL RECEIVE A CALL BACK THE SAME DAY AS LONG AS YOU CALL BEFORE 4:00 PM

## 2023-09-06 DIAGNOSIS — H2512 Age-related nuclear cataract, left eye: Secondary | ICD-10-CM | POA: Diagnosis not present

## 2023-09-06 DIAGNOSIS — H2513 Age-related nuclear cataract, bilateral: Secondary | ICD-10-CM | POA: Diagnosis not present

## 2023-09-09 DIAGNOSIS — I350 Nonrheumatic aortic (valve) stenosis: Secondary | ICD-10-CM | POA: Diagnosis not present

## 2023-09-09 DIAGNOSIS — E1122 Type 2 diabetes mellitus with diabetic chronic kidney disease: Secondary | ICD-10-CM | POA: Diagnosis not present

## 2023-09-09 DIAGNOSIS — I130 Hypertensive heart and chronic kidney disease with heart failure and stage 1 through stage 4 chronic kidney disease, or unspecified chronic kidney disease: Secondary | ICD-10-CM | POA: Diagnosis not present

## 2023-09-09 DIAGNOSIS — I5033 Acute on chronic diastolic (congestive) heart failure: Secondary | ICD-10-CM | POA: Diagnosis not present

## 2023-09-09 DIAGNOSIS — D62 Acute posthemorrhagic anemia: Secondary | ICD-10-CM | POA: Diagnosis not present

## 2023-09-09 DIAGNOSIS — L03311 Cellulitis of abdominal wall: Secondary | ICD-10-CM | POA: Diagnosis not present

## 2023-09-09 DIAGNOSIS — K7581 Nonalcoholic steatohepatitis (NASH): Secondary | ICD-10-CM | POA: Diagnosis not present

## 2023-09-09 DIAGNOSIS — N1832 Chronic kidney disease, stage 3b: Secondary | ICD-10-CM | POA: Diagnosis not present

## 2023-09-11 ENCOUNTER — Ambulatory Visit (HOSPITAL_COMMUNITY)
Admission: RE | Admit: 2023-09-11 | Discharge: 2023-09-11 | Disposition: A | Discharge status: Home / Self Care | Payer: Medicare PPO | Source: Ambulatory Visit | Attending: Cardiology | Admitting: Cardiology

## 2023-09-11 DIAGNOSIS — I5032 Chronic diastolic (congestive) heart failure: Secondary | ICD-10-CM | POA: Insufficient documentation

## 2023-09-11 LAB — BASIC METABOLIC PANEL
Anion gap: 9 (ref 5–15)
BUN: 21 mg/dL (ref 8–23)
CO2: 28 mmol/L (ref 22–32)
Calcium: 9.4 mg/dL (ref 8.9–10.3)
Chloride: 96 mmol/L — ABNORMAL LOW (ref 98–111)
Creatinine, Ser: 1.63 mg/dL — ABNORMAL HIGH (ref 0.44–1.00)
GFR, Estimated: 34 mL/min — ABNORMAL LOW (ref >60)
Glucose, Bld: 200 mg/dL — ABNORMAL HIGH (ref 70–99)
Potassium: 3.8 mmol/L (ref 3.5–5.1)
Sodium: 133 mmol/L — ABNORMAL LOW (ref 135–145)

## 2023-09-18 ENCOUNTER — Encounter: Payer: Self-pay | Admitting: Oncology

## 2023-09-22 ENCOUNTER — Ambulatory Visit (HOSPITAL_BASED_OUTPATIENT_CLINIC_OR_DEPARTMENT_OTHER): Payer: Medicare PPO | Admitting: Family

## 2023-09-22 VITALS — BP 104/58 | HR 100 | Ht 67.0 in | Wt 304.6 lb

## 2023-09-22 DIAGNOSIS — R5381 Other malaise: Secondary | ICD-10-CM

## 2023-09-22 DIAGNOSIS — I5032 Chronic diastolic (congestive) heart failure: Secondary | ICD-10-CM

## 2023-09-22 DIAGNOSIS — I4821 Permanent atrial fibrillation: Secondary | ICD-10-CM | POA: Diagnosis not present
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
## 2023-09-22 NOTE — Patient Instructions (Signed)
 Medication Instructions:  Your physician recommends that you continue on your current medications as directed. Please refer to the Current Medication list given to you today.  *If you need a refill on your cardiac medications before your next appointment, please call your pharmacy*   Lab Work: NONE   Testing/Procedures: NONE  Follow-Up: At [facility], you and your health needs are our priority.  As part of our continuing mission to provide you with exceptional heart care, we have created designated Provider Care Teams.  These Care Teams include your primary Cardiologist (physician) and Advanced Practice Providers (APPs -  Physician Assistants and Nurse Practitioners) who all work together to provide you with the care you need, when you need it.  We recommend signing up for the patient portal called MyChart.  Sign up information is provided on this After Visit Summary.  MyChart is used to connect with patients for Virtual Visits (Telemedicine).  Patients are able to view lab/test results, encounter notes, upcoming appointments, etc.  Non-urgent messages can be sent to your provider as well.   To learn more about what you can do with MyChart, go to [email or web address].    Your next appointment:   6-9 month(s)  Provider:   Reche Finder, NP

## 2023-09-24 ENCOUNTER — Encounter (HOSPITAL_BASED_OUTPATIENT_CLINIC_OR_DEPARTMENT_OTHER): Payer: Self-pay | Admitting: Family

## 2023-09-25 ENCOUNTER — Encounter (HOSPITAL_BASED_OUTPATIENT_CLINIC_OR_DEPARTMENT_OTHER): Payer: Self-pay

## 2023-10-05 DIAGNOSIS — M9902 Segmental and somatic dysfunction of thoracic region: Secondary | ICD-10-CM | POA: Diagnosis not present

## 2023-10-05 DIAGNOSIS — M9901 Segmental and somatic dysfunction of cervical region: Secondary | ICD-10-CM | POA: Diagnosis not present

## 2023-10-05 DIAGNOSIS — M5413 Radiculopathy, cervicothoracic region: Secondary | ICD-10-CM | POA: Diagnosis not present

## 2023-10-05 DIAGNOSIS — M25552 Pain in left hip: Secondary | ICD-10-CM | POA: Diagnosis not present

## 2023-10-05 DIAGNOSIS — M25511 Pain in right shoulder: Secondary | ICD-10-CM | POA: Diagnosis not present

## 2023-10-05 DIAGNOSIS — M62830 Muscle spasm of back: Secondary | ICD-10-CM | POA: Diagnosis not present

## 2023-10-05 DIAGNOSIS — M9903 Segmental and somatic dysfunction of lumbar region: Secondary | ICD-10-CM | POA: Diagnosis not present

## 2023-10-06 ENCOUNTER — Ambulatory Visit (HOSPITAL_BASED_OUTPATIENT_CLINIC_OR_DEPARTMENT_OTHER): Payer: Medicare PPO | Admitting: Family

## 2023-10-09 DIAGNOSIS — M9903 Segmental and somatic dysfunction of lumbar region: Secondary | ICD-10-CM | POA: Diagnosis not present

## 2023-10-09 DIAGNOSIS — M62830 Muscle spasm of back: Secondary | ICD-10-CM | POA: Diagnosis not present

## 2023-10-09 DIAGNOSIS — M25552 Pain in left hip: Secondary | ICD-10-CM | POA: Diagnosis not present

## 2023-10-09 DIAGNOSIS — M9902 Segmental and somatic dysfunction of thoracic region: Secondary | ICD-10-CM | POA: Diagnosis not present

## 2023-10-09 DIAGNOSIS — M5413 Radiculopathy, cervicothoracic region: Secondary | ICD-10-CM | POA: Diagnosis not present

## 2023-10-09 DIAGNOSIS — M25511 Pain in right shoulder: Secondary | ICD-10-CM | POA: Diagnosis not present

## 2023-10-09 DIAGNOSIS — M9901 Segmental and somatic dysfunction of cervical region: Secondary | ICD-10-CM | POA: Diagnosis not present

## 2023-10-12 DIAGNOSIS — M9902 Segmental and somatic dysfunction of thoracic region: Secondary | ICD-10-CM | POA: Diagnosis not present

## 2023-10-12 DIAGNOSIS — M62830 Muscle spasm of back: Secondary | ICD-10-CM | POA: Diagnosis not present

## 2023-10-12 DIAGNOSIS — M5413 Radiculopathy, cervicothoracic region: Secondary | ICD-10-CM | POA: Diagnosis not present

## 2023-10-12 DIAGNOSIS — M9903 Segmental and somatic dysfunction of lumbar region: Secondary | ICD-10-CM | POA: Diagnosis not present

## 2023-10-12 DIAGNOSIS — M25552 Pain in left hip: Secondary | ICD-10-CM | POA: Diagnosis not present

## 2023-10-12 DIAGNOSIS — M25511 Pain in right shoulder: Secondary | ICD-10-CM | POA: Diagnosis not present

## 2023-10-12 DIAGNOSIS — M9901 Segmental and somatic dysfunction of cervical region: Secondary | ICD-10-CM | POA: Diagnosis not present

## 2023-10-14 ENCOUNTER — Other Ambulatory Visit (HOSPITAL_COMMUNITY): Payer: Self-pay | Admitting: Cardiology

## 2023-10-16 DIAGNOSIS — M9903 Segmental and somatic dysfunction of lumbar region: Secondary | ICD-10-CM | POA: Diagnosis not present

## 2023-10-16 DIAGNOSIS — M9902 Segmental and somatic dysfunction of thoracic region: Secondary | ICD-10-CM | POA: Diagnosis not present

## 2023-10-16 DIAGNOSIS — M9901 Segmental and somatic dysfunction of cervical region: Secondary | ICD-10-CM | POA: Diagnosis not present

## 2023-10-16 DIAGNOSIS — M62830 Muscle spasm of back: Secondary | ICD-10-CM | POA: Diagnosis not present

## 2023-10-16 DIAGNOSIS — M25552 Pain in left hip: Secondary | ICD-10-CM | POA: Diagnosis not present

## 2023-10-16 DIAGNOSIS — M25511 Pain in right shoulder: Secondary | ICD-10-CM | POA: Diagnosis not present

## 2023-10-16 DIAGNOSIS — M5413 Radiculopathy, cervicothoracic region: Secondary | ICD-10-CM | POA: Diagnosis not present

## 2023-10-17 ENCOUNTER — Other Ambulatory Visit: Payer: Self-pay

## 2023-10-17 ENCOUNTER — Encounter: Payer: Self-pay | Admitting: Physical Therapy

## 2023-10-17 ENCOUNTER — Ambulatory Visit: Payer: Medicare PPO | Attending: Family | Admitting: Physical Therapy

## 2023-10-17 DIAGNOSIS — M6281 Muscle weakness (generalized): Secondary | ICD-10-CM | POA: Diagnosis not present

## 2023-10-17 DIAGNOSIS — R2681 Unsteadiness on feet: Secondary | ICD-10-CM | POA: Diagnosis not present

## 2023-10-17 DIAGNOSIS — R5381 Other malaise: Secondary | ICD-10-CM | POA: Insufficient documentation

## 2023-10-17 DIAGNOSIS — R2689 Other abnormalities of gait and mobility: Secondary | ICD-10-CM | POA: Diagnosis not present
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
## 2023-10-19 DIAGNOSIS — M9903 Segmental and somatic dysfunction of lumbar region: Secondary | ICD-10-CM | POA: Diagnosis not present

## 2023-10-19 DIAGNOSIS — M9901 Segmental and somatic dysfunction of cervical region: Secondary | ICD-10-CM | POA: Diagnosis not present

## 2023-10-19 DIAGNOSIS — M62830 Muscle spasm of back: Secondary | ICD-10-CM | POA: Diagnosis not present

## 2023-10-19 DIAGNOSIS — M5413 Radiculopathy, cervicothoracic region: Secondary | ICD-10-CM | POA: Diagnosis not present

## 2023-10-19 DIAGNOSIS — M25552 Pain in left hip: Secondary | ICD-10-CM | POA: Diagnosis not present

## 2023-10-19 DIAGNOSIS — M9902 Segmental and somatic dysfunction of thoracic region: Secondary | ICD-10-CM | POA: Diagnosis not present

## 2023-10-19 DIAGNOSIS — M25511 Pain in right shoulder: Secondary | ICD-10-CM | POA: Diagnosis not present

## 2023-10-24 ENCOUNTER — Encounter: Payer: Self-pay | Admitting: Physical Therapy

## 2023-10-24 ENCOUNTER — Ambulatory Visit: Payer: Medicare PPO | Attending: Family | Admitting: Physical Therapy

## 2023-10-24 DIAGNOSIS — M9902 Segmental and somatic dysfunction of thoracic region: Secondary | ICD-10-CM | POA: Diagnosis not present

## 2023-10-24 DIAGNOSIS — R2689 Other abnormalities of gait and mobility: Secondary | ICD-10-CM | POA: Insufficient documentation

## 2023-10-24 DIAGNOSIS — M62830 Muscle spasm of back: Secondary | ICD-10-CM | POA: Diagnosis not present

## 2023-10-24 DIAGNOSIS — M5413 Radiculopathy, cervicothoracic region: Secondary | ICD-10-CM | POA: Diagnosis not present

## 2023-10-24 DIAGNOSIS — R2681 Unsteadiness on feet: Secondary | ICD-10-CM | POA: Diagnosis not present

## 2023-10-24 DIAGNOSIS — M6281 Muscle weakness (generalized): Secondary | ICD-10-CM | POA: Insufficient documentation

## 2023-10-24 DIAGNOSIS — M9903 Segmental and somatic dysfunction of lumbar region: Secondary | ICD-10-CM | POA: Diagnosis not present

## 2023-10-24 DIAGNOSIS — M25511 Pain in right shoulder: Secondary | ICD-10-CM | POA: Diagnosis not present

## 2023-10-24 DIAGNOSIS — M25552 Pain in left hip: Secondary | ICD-10-CM | POA: Diagnosis not present

## 2023-10-24 DIAGNOSIS — M9901 Segmental and somatic dysfunction of cervical region: Secondary | ICD-10-CM | POA: Diagnosis not present
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
## 2023-10-26 ENCOUNTER — Ambulatory Visit: Payer: Medicare PPO | Admitting: Physical Therapy

## 2023-10-31 ENCOUNTER — Ambulatory Visit: Payer: Medicare PPO | Admitting: Physical Therapy

## 2023-10-31 ENCOUNTER — Encounter: Payer: Self-pay | Admitting: Physical Therapy

## 2023-10-31 DIAGNOSIS — M6281 Muscle weakness (generalized): Secondary | ICD-10-CM

## 2023-10-31 DIAGNOSIS — R2681 Unsteadiness on feet: Secondary | ICD-10-CM

## 2023-10-31 DIAGNOSIS — R2689 Other abnormalities of gait and mobility: Secondary | ICD-10-CM | POA: Diagnosis not present
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
## 2023-11-09 DIAGNOSIS — D638 Anemia in other chronic diseases classified elsewhere: Secondary | ICD-10-CM | POA: Diagnosis not present

## 2023-11-09 DIAGNOSIS — N19 Unspecified kidney failure: Secondary | ICD-10-CM | POA: Diagnosis not present

## 2023-11-09 DIAGNOSIS — R42 Dizziness and giddiness: Secondary | ICD-10-CM | POA: Diagnosis not present

## 2023-11-09 DIAGNOSIS — D62 Acute posthemorrhagic anemia: Secondary | ICD-10-CM | POA: Diagnosis not present

## 2023-11-09 DIAGNOSIS — N189 Chronic kidney disease, unspecified: Secondary | ICD-10-CM | POA: Diagnosis not present

## 2023-11-09 DIAGNOSIS — D696 Thrombocytopenia, unspecified: Secondary | ICD-10-CM | POA: Diagnosis not present

## 2023-11-09 DIAGNOSIS — K922 Gastrointestinal hemorrhage, unspecified: Secondary | ICD-10-CM | POA: Diagnosis not present

## 2023-11-09 DIAGNOSIS — J90 Pleural effusion, not elsewhere classified: Secondary | ICD-10-CM | POA: Diagnosis not present

## 2023-11-09 DIAGNOSIS — I130 Hypertensive heart and chronic kidney disease with heart failure and stage 1 through stage 4 chronic kidney disease, or unspecified chronic kidney disease: Secondary | ICD-10-CM | POA: Diagnosis not present

## 2023-11-09 DIAGNOSIS — K766 Portal hypertension: Secondary | ICD-10-CM | POA: Diagnosis not present

## 2023-11-09 DIAGNOSIS — K920 Hematemesis: Secondary | ICD-10-CM | POA: Diagnosis not present

## 2023-11-09 DIAGNOSIS — R112 Nausea with vomiting, unspecified: Secondary | ICD-10-CM | POA: Diagnosis not present

## 2023-11-09 DIAGNOSIS — K7030 Alcoholic cirrhosis of liver without ascites: Secondary | ICD-10-CM | POA: Diagnosis not present

## 2023-11-09 DIAGNOSIS — I509 Heart failure, unspecified: Secondary | ICD-10-CM | POA: Diagnosis not present

## 2023-11-09 DIAGNOSIS — Z6841 Body Mass Index (BMI) 40.0 and over, adult: Secondary | ICD-10-CM | POA: Diagnosis not present

## 2023-11-09 DIAGNOSIS — E1122 Type 2 diabetes mellitus with diabetic chronic kidney disease: Secondary | ICD-10-CM | POA: Diagnosis not present

## 2023-11-09 DIAGNOSIS — K7460 Unspecified cirrhosis of liver: Secondary | ICD-10-CM | POA: Diagnosis not present

## 2023-11-09 DIAGNOSIS — E119 Type 2 diabetes mellitus without complications: Secondary | ICD-10-CM | POA: Diagnosis not present

## 2023-11-09 DIAGNOSIS — N179 Acute kidney failure, unspecified: Secondary | ICD-10-CM | POA: Diagnosis not present

## 2023-11-09 DIAGNOSIS — I8511 Secondary esophageal varices with bleeding: Secondary | ICD-10-CM | POA: Diagnosis not present

## 2023-11-09 DIAGNOSIS — I4891 Unspecified atrial fibrillation: Secondary | ICD-10-CM | POA: Diagnosis not present
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
## 2023-11-14 ENCOUNTER — Other Ambulatory Visit: Payer: Self-pay | Admitting: Interventional Radiology

## 2023-11-14 ENCOUNTER — Ambulatory Visit: Payer: Medicare PPO | Admitting: Physical Therapy

## 2023-11-15 ENCOUNTER — Other Ambulatory Visit: Payer: Self-pay | Admitting: Interventional Radiology

## 2023-11-15 DIAGNOSIS — K7581 Nonalcoholic steatohepatitis (NASH): Secondary | ICD-10-CM

## 2023-11-15 DIAGNOSIS — G4733 Obstructive sleep apnea (adult) (pediatric): Secondary | ICD-10-CM | POA: Diagnosis not present

## 2023-11-15 DIAGNOSIS — K7460 Unspecified cirrhosis of liver: Secondary | ICD-10-CM | POA: Diagnosis not present

## 2023-11-15 DIAGNOSIS — I864 Gastric varices: Secondary | ICD-10-CM | POA: Diagnosis not present

## 2023-11-15 DIAGNOSIS — K920 Hematemesis: Secondary | ICD-10-CM | POA: Diagnosis not present

## 2023-11-16 ENCOUNTER — Ambulatory Visit: Payer: Medicare PPO | Admitting: Physical Therapy

## 2023-11-16 ENCOUNTER — Encounter: Payer: Self-pay | Admitting: Physical Therapy

## 2023-11-16 DIAGNOSIS — M6281 Muscle weakness (generalized): Secondary | ICD-10-CM

## 2023-11-16 DIAGNOSIS — H35033 Hypertensive retinopathy, bilateral: Secondary | ICD-10-CM | POA: Diagnosis not present

## 2023-11-16 DIAGNOSIS — E113293 Type 2 diabetes mellitus with mild nonproliferative diabetic retinopathy without macular edema, bilateral: Secondary | ICD-10-CM | POA: Diagnosis not present

## 2023-11-16 DIAGNOSIS — H353112 Nonexudative age-related macular degeneration, right eye, intermediate dry stage: Secondary | ICD-10-CM | POA: Diagnosis not present

## 2023-11-16 DIAGNOSIS — R2689 Other abnormalities of gait and mobility: Secondary | ICD-10-CM

## 2023-11-16 DIAGNOSIS — R2681 Unsteadiness on feet: Secondary | ICD-10-CM

## 2023-11-16 DIAGNOSIS — H353221 Exudative age-related macular degeneration, left eye, with active choroidal neovascularization: Secondary | ICD-10-CM | POA: Diagnosis not present

## 2023-11-16 DIAGNOSIS — H43813 Vitreous degeneration, bilateral: Secondary | ICD-10-CM | POA: Diagnosis not present
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
## 2023-11-17 DIAGNOSIS — E215 Disorder of parathyroid gland, unspecified: Secondary | ICD-10-CM | POA: Diagnosis not present

## 2023-11-17 DIAGNOSIS — I5032 Chronic diastolic (congestive) heart failure: Secondary | ICD-10-CM | POA: Diagnosis not present

## 2023-11-17 DIAGNOSIS — K7460 Unspecified cirrhosis of liver: Secondary | ICD-10-CM | POA: Diagnosis not present

## 2023-11-17 DIAGNOSIS — I864 Gastric varices: Secondary | ICD-10-CM | POA: Diagnosis not present

## 2023-11-20 DIAGNOSIS — E215 Disorder of parathyroid gland, unspecified: Secondary | ICD-10-CM | POA: Diagnosis not present

## 2023-11-20 DIAGNOSIS — I864 Gastric varices: Secondary | ICD-10-CM | POA: Diagnosis not present

## 2023-11-21 ENCOUNTER — Ambulatory Visit: Payer: Medicare PPO | Admitting: Physical Therapy

## 2023-11-23 ENCOUNTER — Ambulatory Visit: Payer: Medicare PPO | Admitting: Physical Therapy

## 2023-11-24 ENCOUNTER — Ambulatory Visit (HOSPITAL_COMMUNITY)
Admission: RE | Admit: 2023-11-24 | Discharge: 2023-11-24 | Disposition: A | Discharge status: Home / Self Care | Payer: Medicare PPO | Source: Ambulatory Visit | Attending: Interventional Radiology | Admitting: Interventional Radiology

## 2023-11-24 DIAGNOSIS — K7460 Unspecified cirrhosis of liver: Secondary | ICD-10-CM | POA: Insufficient documentation

## 2023-11-24 DIAGNOSIS — K7581 Nonalcoholic steatohepatitis (NASH): Secondary | ICD-10-CM | POA: Insufficient documentation

## 2023-11-24 DIAGNOSIS — J019 Acute sinusitis, unspecified: Secondary | ICD-10-CM | POA: Diagnosis not present

## 2023-11-24 DIAGNOSIS — I81 Portal vein thrombosis: Secondary | ICD-10-CM | POA: Diagnosis not present

## 2023-11-24 DIAGNOSIS — K7469 Other cirrhosis of liver: Secondary | ICD-10-CM | POA: Diagnosis not present

## 2023-11-24 DIAGNOSIS — K7689 Other specified diseases of liver: Secondary | ICD-10-CM | POA: Diagnosis not present

## 2023-11-24 DIAGNOSIS — K766 Portal hypertension: Secondary | ICD-10-CM | POA: Diagnosis not present

## 2023-11-24 LAB — POCT I-STAT CREATININE: Creatinine, Ser: 1.6 mg/dL — ABNORMAL HIGH (ref 0.44–1.00)

## 2023-11-24 MED ORDER — IOHEXOL 350 MG/ML SOLN
100.0000 mL | Freq: Once | INTRAVENOUS | Status: AC | PRN
  Administered 2023-11-24: 80 mL via INTRAVENOUS

## 2023-11-29 ENCOUNTER — Encounter (HOSPITAL_COMMUNITY): Payer: Medicare PPO | Admitting: Cardiology

## 2023-12-05 NOTE — Progress Notes (Signed)
 ADVANCED HEART FAILURE CLINIC NOTE  Referring Physician: Darrow Bussing, MD  Primary Care: Darrow Bussing, MD Primary Cardiologist: Chilton Si, MD  HPI: Hannah Khan is a 68 y.o. female with HFpEF, morbid obesity, obstructive sleep apnea, NASH/cirrhosis, thrombocytopenia, type 2 diabetes, aortic stenosis, vasovagal syncope, chronic atrial fibrillation presenting today to establish care.  Her cardiac history dates back to June 2021 when she was diagnosed with atrial fibrillation.  At that time she decided to continue with medical management after seeing Dr. Ladona Ridgel.  She is admitted in May 2022 with atrial fibrillation and hepatic encephalopathy.  Recurrent admission and 7/22 with acute on chronic HFpEF due to dietary indiscretion.  At that time she was started on Farxiga, Bumex 2 mg daily and metolazone as needed.  She has been unable to start spironolactone due to symptomatic hypotension.  Due to her underlying cirrhosis she was planned to undergo TIPS on April 2024 but the procedure was aborted due to a right atrial pressure of 23 mmHg. She presented previously for optimization prior to TIPS. After diuresis, we repeated a RHC which demonstrated improvement in RA pressures. Unfortunately, TIPS complicated by hemoperitoneum. Today she presents for follow up.   Interval hx:  On exam Hannah Khan has 3+ pitting edema to the thighs. She is wearing compression stockings daily with only some improvement in lower extremity edema. She becomes quickly short of breath and requires a wheelchair or walker for walking >61ft. Currently taking torsemide 80mg  daily.    Current Outpatient Medications  Medication Sig Dispense Refill   albuterol (VENTOLIN HFA) 108 (90 Base) MCG/ACT inhaler Inhale 1-2 puffs into the lungs every 6 (six) hours as needed for wheezing or shortness of breath.     digoxin (LANOXIN) 0.125 MG tablet Take 1 tablet (125 mcg total) by mouth daily. 30 tablet 11   gatifloxacin  (ZYMAXID) 0.5 % SOLN Place 1 drop into the left eye 4 (four) times daily. (Patient not taking: Reported on 10/17/2023)     ketorolac (ACULAR) 0.5 % ophthalmic solution Place 1 drop into the left eye 4 (four) times daily. (Patient not taking: Reported on 10/17/2023)     lactulose (CHRONULAC) 10 GM/15ML solution Take 20 g by mouth 3 (three) times daily.     Lancets (ONETOUCH DELICA PLUS LANCET33G) MISC Apply topically 3 (three) times daily.     Lidocaine HCl-Benzyl Alcohol (SALONPAS LIDOCAINE PLUS) 4-10 % CREA Apply 1 Application topically daily as needed (knee pain).     metolazone (ZAROXOLYN) 2.5 MG tablet TAKE 1 TABLET (2.5 MG TOTAL) BY MOUTH AS NEEDED (AS DIRECTED BY HF CLINIC). 5 tablet 0   metoprolol tartrate (LOPRESSOR) 25 MG tablet Take 1 tablet (25 mg total) by mouth 2 (two) times daily. 60 tablet 0   midodrine (PROAMATINE) 10 MG tablet Take 1 tablet (10 mg total) by mouth 3 (three) times daily. 270 tablet 2   Multiple Vitamins-Minerals (PRESERVISION AREDS 2) CAPS Take 1 capsule by mouth 2 (two) times daily.     ONETOUCH VERIO test strip 1 each 3 (three) times daily.     potassium chloride (MICRO-K) 10 MEQ CR capsule Take 10 capsules (100 mEq total) by mouth 2 (two) times daily. Take an extra 4 tablets when taking Metolazone. 700 capsule 5   prednisoLONE acetate (PRED FORTE) 1 % ophthalmic suspension Place 1 drop into the left eye 4 (four) times daily. (Patient not taking: Reported on 10/17/2023)     Propylene Glycol (SYSTANE BALANCE) 0.6 % SOLN Place 1 drop  into both eyes 2 (two) times daily.     spironolactone (ALDACTONE) 50 MG tablet Take 1 tablet (50 mg total) by mouth daily. 90 tablet 3   torsemide (DEMADEX) 20 MG tablet Take 5 tablets (100 mg total) by mouth every morning for 5 days, THEN 2 tablets (40 mg total) every evening for 5 days, THEN 5 tablets (100 mg total) daily. (Patient taking differently: Take 5 tablets (100 mg total) by mouth daily) 180 tablet 3   XIFAXAN 550 MG TABS tablet  Take 550 mg by mouth 2 (two) times daily.     No current facility-administered medications for this visit.   PHYSICAL EXAM: There were no vitals filed for this visit. GENERAL: NAD Lungs- *** CARDIAC:  JVP: *** cm          Normal rate with regular rhythm. *** murmur.  Pulses ***. *** edema.  ABDOMEN: Soft, non-tender, non-distended.  EXTREMITIES: Warm and well perfused.  NEUROLOGIC: No obvious FND   DATA REVIEW  ECG: 04/06/23: atrial fibrillation  as per my personal interpretation  ECHO: 03/07/23: LVEF 65%, normal RV function; RV is dilated with septal flattening as per my personal interpretation  CATH: HEMODYNAMICS: RA:                  10 mmHg (mean) RV:                  34/10 mmHg PA:                  37/18 mmHg (28 mean) PCWP:            23 mmHg (mean)                                      Estimated Fick CO/CI   8.5 L/min, 3.4 L/min/m2 Thermodilution CO/CI   9 L/min, 3. 6 L/min/m2                                                TPG                 5  mmHg                                              PVR                 0.5 Wood Units  PAPi                2.4     ASSESSMENT & PLAN:  Heart failure with preserved ejection fraction -RHC as noted above -Unsuccessful TIPS -Now on torsemide 80mg  daily & spironolactone 25mg  daily but severely hypervolemic. Increase torsemide to 100mg  in the morning and 40mg  in the evening for 5 days. Will continue 100mg  daily afterwards. Increase spironolactone to 50mg  daily. BMP/BNP today with repeat labs next week.   2. NASH Cirrhosis - Child pugh B, MELD 15 - Diagnosed in 2009 - Hx of esophageal varices - x1 admission for hepatic encephalopathy with lactulose/rifaximin.  -Admitted for TIPS on 06/30/23 which was unsuccessful; performed baloon-occluded retrograde transvenous obliteration to treat gastric varices. Post-op course was  complicated by hemoperitoneum and afib w/ RVR.   3. Atrial fibrillation -EKG with atrial  fibrillation. -Anticoagulation being held due to persistent thrombocytopenia from underlying cirrhosis -Will continue rate control strategy for the time being; has been doing very well on diltiazem.  Will continue for the time being. -Rate controlled today; doing well.   4. Thrombocytopenia - Secondary to cirrhosis; plt count 20-30K for the past month.  - Reviewed plt count for hospitalization; stable thrombocytopenia.   5. CKDIIIB - Baseline sCr around 1.5-1.3 - Repeat BMP today; increase spiro.   6. Chronic lymphedema - Will refer to lymphedema clinic and place order for device.   Hannah Khan Advanced Heart Failure Mechanical Circulatory Support

## 2023-12-06 ENCOUNTER — Ambulatory Visit (HOSPITAL_COMMUNITY)
Admission: RE | Admit: 2023-12-06 | Discharge: 2023-12-06 | Disposition: A | Discharge status: Home / Self Care | Payer: Medicare PPO | Source: Ambulatory Visit | Attending: Cardiology | Admitting: Cardiology

## 2023-12-06 ENCOUNTER — Encounter (HOSPITAL_COMMUNITY): Payer: Self-pay | Admitting: Cardiology

## 2023-12-06 VITALS — BP 132/78 | HR 82 | Ht 67.0 in | Wt 296.0 lb

## 2023-12-06 DIAGNOSIS — D6959 Other secondary thrombocytopenia: Secondary | ICD-10-CM | POA: Insufficient documentation

## 2023-12-06 DIAGNOSIS — E1122 Type 2 diabetes mellitus with diabetic chronic kidney disease: Secondary | ICD-10-CM | POA: Insufficient documentation

## 2023-12-06 DIAGNOSIS — K920 Hematemesis: Secondary | ICD-10-CM | POA: Diagnosis not present

## 2023-12-06 DIAGNOSIS — I959 Hypotension, unspecified: Secondary | ICD-10-CM | POA: Diagnosis not present

## 2023-12-06 DIAGNOSIS — K7581 Nonalcoholic steatohepatitis (NASH): Secondary | ICD-10-CM | POA: Diagnosis not present

## 2023-12-06 DIAGNOSIS — I4821 Permanent atrial fibrillation: Secondary | ICD-10-CM

## 2023-12-06 DIAGNOSIS — Z79899 Other long term (current) drug therapy: Secondary | ICD-10-CM | POA: Diagnosis not present

## 2023-12-06 DIAGNOSIS — N1832 Chronic kidney disease, stage 3b: Secondary | ICD-10-CM | POA: Diagnosis not present

## 2023-12-06 DIAGNOSIS — I5032 Chronic diastolic (congestive) heart failure: Secondary | ICD-10-CM | POA: Insufficient documentation

## 2023-12-06 DIAGNOSIS — I872 Venous insufficiency (chronic) (peripheral): Secondary | ICD-10-CM

## 2023-12-06 DIAGNOSIS — I482 Chronic atrial fibrillation, unspecified: Secondary | ICD-10-CM | POA: Diagnosis not present

## 2023-12-06 DIAGNOSIS — I890 Lymphedema, not elsewhere classified: Secondary | ICD-10-CM | POA: Diagnosis not present

## 2023-12-06 DIAGNOSIS — N1831 Chronic kidney disease, stage 3a: Secondary | ICD-10-CM | POA: Diagnosis not present

## 2023-12-06 DIAGNOSIS — I350 Nonrheumatic aortic (valve) stenosis: Secondary | ICD-10-CM | POA: Diagnosis not present

## 2023-12-06 DIAGNOSIS — K7460 Unspecified cirrhosis of liver: Secondary | ICD-10-CM | POA: Insufficient documentation

## 2023-12-06 DIAGNOSIS — G4733 Obstructive sleep apnea (adult) (pediatric): Secondary | ICD-10-CM | POA: Diagnosis not present

## 2023-12-06 LAB — BRAIN NATRIURETIC PEPTIDE: B Natriuretic Peptide: 249.9 pg/mL — ABNORMAL HIGH (ref 0.0–100.0)

## 2023-12-06 LAB — BASIC METABOLIC PANEL
Anion gap: 9 (ref 5–15)
BUN: 21 mg/dL (ref 8–23)
CO2: 23 mmol/L (ref 22–32)
Calcium: 9.3 mg/dL (ref 8.9–10.3)
Chloride: 102 mmol/L (ref 98–111)
Creatinine, Ser: 1.68 mg/dL — ABNORMAL HIGH (ref 0.44–1.00)
GFR, Estimated: 33 mL/min — ABNORMAL LOW (ref >60)
Glucose, Bld: 303 mg/dL — ABNORMAL HIGH (ref 70–99)
Potassium: 4.1 mmol/L (ref 3.5–5.1)
Sodium: 134 mmol/L — ABNORMAL LOW (ref 135–145)

## 2023-12-06 MED ORDER — SPIRONOLACTONE 25 MG PO TABS: 75.0000 mg | ORAL_TABLET | Freq: Every day | ORAL | 3 refills | Status: DC

## 2023-12-06 NOTE — Patient Instructions (Addendum)
 Medication Changes:  INCREASE SPIRONOLACTONE 75 MG BY MOUTH DAILY.    Lab Work:  Labs done today, your results will be available in MyChart, we will contact you for abnormal readings.    Follow-Up in: IN 4 MONTHS WITH SABHARWAL. GIVE OUR OFFICE A CALL IN MAY TO SCHEDULE APPOINTMENT.   At the [facility], you and your health needs are our priority. We have a designated team specialized in the treatment of Heart Failure. This Care Team includes your primary Heart Failure Specialized Cardiologist (physician), Advanced Practice Providers (APPs- Physician Assistants and Nurse Practitioners), and Pharmacist who all work together to provide you with the care you need, when you need it.   You may see any of the following providers on your designated Care Team at your next follow up:  Dr. Arvilla Meres Dr. Marca Ancona Dr. Dorthula Nettles Dr. Theresia Bough Tonye Becket, NP Robbie Lis, [place] [facility] [place], [place] Brynda Peon, NP [place] Lee, NP Karle Plumber, PharmD   Please be sure to bring in all your medications bottles to every appointment.   Need to Contact us:  If you have any questions or concerns before your next appointment please send [place] a message through [place] or call our office at [phone].    TO LEAVE A MESSAGE FOR THE NURSE SELECT OPTION 2, PLEASE LEAVE A MESSAGE INCLUDING: YOUR NAME DATE OF BIRTH CALL BACK NUMBER REASON FOR CALL**this is important as we prioritize the call backs  YOU WILL RECEIVE A CALL BACK THE SAME DAY AS LONG AS YOU CALL BEFORE 4:00 PM

## 2023-12-13 ENCOUNTER — Ambulatory Visit (HOSPITAL_COMMUNITY)
Admission: RE | Admit: 2023-12-13 | Discharge: 2023-12-13 | Disposition: A | Discharge status: Home / Self Care | Source: Ambulatory Visit | Attending: Cardiology | Admitting: Cardiology

## 2023-12-13 DIAGNOSIS — I5032 Chronic diastolic (congestive) heart failure: Secondary | ICD-10-CM | POA: Insufficient documentation

## 2023-12-13 LAB — BASIC METABOLIC PANEL
Anion gap: 11 (ref 5–15)
BUN: 21 mg/dL (ref 8–23)
CO2: 22 mmol/L (ref 22–32)
Calcium: 9.3 mg/dL (ref 8.9–10.3)
Chloride: 100 mmol/L (ref 98–111)
Creatinine, Ser: 1.64 mg/dL — ABNORMAL HIGH (ref 0.44–1.00)
GFR, Estimated: 34 mL/min — ABNORMAL LOW (ref >60)
Glucose, Bld: 224 mg/dL — ABNORMAL HIGH (ref 70–99)
Potassium: 4.7 mmol/L (ref 3.5–5.1)
Sodium: 133 mmol/L — ABNORMAL LOW (ref 135–145)

## 2023-12-15 DIAGNOSIS — I864 Gastric varices: Secondary | ICD-10-CM | POA: Diagnosis not present

## 2023-12-15 DIAGNOSIS — K7460 Unspecified cirrhosis of liver: Secondary | ICD-10-CM | POA: Diagnosis not present

## 2023-12-15 DIAGNOSIS — Z860100 Personal history of colon polyps, unspecified: Secondary | ICD-10-CM | POA: Diagnosis not present

## 2023-12-19 ENCOUNTER — Other Ambulatory Visit: Payer: Self-pay | Admitting: Interventional Radiology

## 2023-12-19 DIAGNOSIS — K766 Portal hypertension: Secondary | ICD-10-CM
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
## 2023-12-25 ENCOUNTER — Ambulatory Visit
Admission: RE | Admit: 2023-12-25 | Discharge: 2023-12-25 | Disposition: A | Discharge status: Home / Self Care | Source: Ambulatory Visit | Attending: Interventional Radiology

## 2023-12-25 DIAGNOSIS — K766 Portal hypertension: Secondary | ICD-10-CM | POA: Diagnosis not present

## 2023-12-25 DIAGNOSIS — K7581 Nonalcoholic steatohepatitis (NASH): Secondary | ICD-10-CM | POA: Diagnosis not present

## 2023-12-25 HISTORY — PX: IR RADIOLOGIST EVAL & MGMT: IMG5224

## 2023-12-28 DIAGNOSIS — M5413 Radiculopathy, cervicothoracic region: Secondary | ICD-10-CM | POA: Diagnosis not present

## 2023-12-28 DIAGNOSIS — M25552 Pain in left hip: Secondary | ICD-10-CM | POA: Diagnosis not present

## 2023-12-28 DIAGNOSIS — E119 Type 2 diabetes mellitus without complications: Secondary | ICD-10-CM | POA: Diagnosis not present

## 2023-12-28 DIAGNOSIS — K7460 Unspecified cirrhosis of liver: Secondary | ICD-10-CM | POA: Diagnosis not present

## 2023-12-28 DIAGNOSIS — D696 Thrombocytopenia, unspecified: Secondary | ICD-10-CM | POA: Diagnosis not present

## 2023-12-28 DIAGNOSIS — M25511 Pain in right shoulder: Secondary | ICD-10-CM | POA: Diagnosis not present

## 2023-12-28 DIAGNOSIS — I10 Essential (primary) hypertension: Secondary | ICD-10-CM | POA: Diagnosis not present

## 2023-12-28 DIAGNOSIS — M9901 Segmental and somatic dysfunction of cervical region: Secondary | ICD-10-CM | POA: Diagnosis not present

## 2023-12-28 DIAGNOSIS — M9903 Segmental and somatic dysfunction of lumbar region: Secondary | ICD-10-CM | POA: Diagnosis not present

## 2023-12-28 DIAGNOSIS — M9902 Segmental and somatic dysfunction of thoracic region: Secondary | ICD-10-CM | POA: Diagnosis not present

## 2023-12-28 DIAGNOSIS — M62830 Muscle spasm of back: Secondary | ICD-10-CM | POA: Diagnosis not present

## 2023-12-29 DIAGNOSIS — M25511 Pain in right shoulder: Secondary | ICD-10-CM | POA: Diagnosis not present

## 2023-12-29 DIAGNOSIS — M9901 Segmental and somatic dysfunction of cervical region: Secondary | ICD-10-CM | POA: Diagnosis not present

## 2023-12-29 DIAGNOSIS — M62830 Muscle spasm of back: Secondary | ICD-10-CM | POA: Diagnosis not present

## 2023-12-29 DIAGNOSIS — M9903 Segmental and somatic dysfunction of lumbar region: Secondary | ICD-10-CM | POA: Diagnosis not present

## 2023-12-29 DIAGNOSIS — M25552 Pain in left hip: Secondary | ICD-10-CM | POA: Diagnosis not present

## 2023-12-29 DIAGNOSIS — M5413 Radiculopathy, cervicothoracic region: Secondary | ICD-10-CM | POA: Diagnosis not present

## 2023-12-29 DIAGNOSIS — M9902 Segmental and somatic dysfunction of thoracic region: Secondary | ICD-10-CM | POA: Diagnosis not present

## 2024-01-01 DIAGNOSIS — I864 Gastric varices: Secondary | ICD-10-CM | POA: Diagnosis not present

## 2024-01-01 DIAGNOSIS — R234 Changes in skin texture: Secondary | ICD-10-CM | POA: Diagnosis not present

## 2024-01-01 DIAGNOSIS — R161 Splenomegaly, not elsewhere classified: Secondary | ICD-10-CM | POA: Diagnosis not present

## 2024-01-01 DIAGNOSIS — K7460 Unspecified cirrhosis of liver: Secondary | ICD-10-CM | POA: Diagnosis not present

## 2024-01-01 DIAGNOSIS — K766 Portal hypertension: Secondary | ICD-10-CM | POA: Diagnosis not present

## 2024-01-01 DIAGNOSIS — R188 Other ascites: Secondary | ICD-10-CM | POA: Diagnosis not present

## 2024-01-09 DIAGNOSIS — K7460 Unspecified cirrhosis of liver: Secondary | ICD-10-CM | POA: Diagnosis not present

## 2024-01-09 DIAGNOSIS — I864 Gastric varices: Secondary | ICD-10-CM | POA: Diagnosis not present

## 2024-01-11 ENCOUNTER — Other Ambulatory Visit (HOSPITAL_BASED_OUTPATIENT_CLINIC_OR_DEPARTMENT_OTHER): Payer: Self-pay | Admitting: Family

## 2024-01-11 DIAGNOSIS — K766 Portal hypertension: Secondary | ICD-10-CM | POA: Diagnosis not present

## 2024-01-11 DIAGNOSIS — E113212 Type 2 diabetes mellitus with mild nonproliferative diabetic retinopathy with macular edema, left eye: Secondary | ICD-10-CM | POA: Diagnosis not present

## 2024-01-11 DIAGNOSIS — I4821 Permanent atrial fibrillation: Secondary | ICD-10-CM

## 2024-01-11 DIAGNOSIS — Z136 Encounter for screening for cardiovascular disorders: Secondary | ICD-10-CM | POA: Diagnosis not present

## 2024-01-11 DIAGNOSIS — I350 Nonrheumatic aortic (valve) stenosis: Secondary | ICD-10-CM

## 2024-01-11 DIAGNOSIS — K7460 Unspecified cirrhosis of liver: Secondary | ICD-10-CM

## 2024-01-11 DIAGNOSIS — I5032 Chronic diastolic (congestive) heart failure: Secondary | ICD-10-CM | POA: Diagnosis not present

## 2024-01-11 DIAGNOSIS — E2839 Other primary ovarian failure: Secondary | ICD-10-CM | POA: Diagnosis not present

## 2024-01-11 DIAGNOSIS — E876 Hypokalemia: Secondary | ICD-10-CM

## 2024-01-11 DIAGNOSIS — Z0001 Encounter for general adult medical examination with abnormal findings: Secondary | ICD-10-CM | POA: Diagnosis not present

## 2024-01-11 DIAGNOSIS — D696 Thrombocytopenia, unspecified: Secondary | ICD-10-CM | POA: Diagnosis not present

## 2024-01-11 DIAGNOSIS — G4733 Obstructive sleep apnea (adult) (pediatric): Secondary | ICD-10-CM

## 2024-01-12 ENCOUNTER — Other Ambulatory Visit: Payer: Self-pay | Admitting: Family Medicine

## 2024-01-12 DIAGNOSIS — E2839 Other primary ovarian failure: Secondary | ICD-10-CM

## 2024-01-30 DIAGNOSIS — J90 Pleural effusion, not elsewhere classified: Secondary | ICD-10-CM | POA: Diagnosis not present

## 2024-01-30 DIAGNOSIS — I517 Cardiomegaly: Secondary | ICD-10-CM | POA: Diagnosis not present

## 2024-01-30 DIAGNOSIS — R011 Cardiac murmur, unspecified: Secondary | ICD-10-CM | POA: Diagnosis not present

## 2024-01-30 DIAGNOSIS — R051 Acute cough: Secondary | ICD-10-CM | POA: Diagnosis not present

## 2024-01-30 DIAGNOSIS — R0602 Shortness of breath: Secondary | ICD-10-CM | POA: Diagnosis not present

## 2024-02-02 ENCOUNTER — Inpatient Hospital Stay (HOSPITAL_BASED_OUTPATIENT_CLINIC_OR_DEPARTMENT_OTHER)
Admission: EM | Admit: 2024-02-02 | Discharge: 2024-02-07 | DRG: 432 | Disposition: A | Discharge status: Home / Self Care | Attending: Internal Medicine | Admitting: Internal Medicine

## 2024-02-02 ENCOUNTER — Encounter (HOSPITAL_BASED_OUTPATIENT_CLINIC_OR_DEPARTMENT_OTHER): Payer: Self-pay | Admitting: Emergency Medicine

## 2024-02-02 ENCOUNTER — Other Ambulatory Visit: Payer: Self-pay

## 2024-02-02 ENCOUNTER — Emergency Department (HOSPITAL_BASED_OUTPATIENT_CLINIC_OR_DEPARTMENT_OTHER)

## 2024-02-02 DIAGNOSIS — I350 Nonrheumatic aortic (valve) stenosis: Secondary | ICD-10-CM | POA: Diagnosis present

## 2024-02-02 DIAGNOSIS — K766 Portal hypertension: Secondary | ICD-10-CM | POA: Diagnosis present

## 2024-02-02 DIAGNOSIS — Z79899 Other long term (current) drug therapy: Secondary | ICD-10-CM

## 2024-02-02 DIAGNOSIS — Z91148 Patient's other noncompliance with medication regimen for other reason: Secondary | ICD-10-CM

## 2024-02-02 DIAGNOSIS — J9601 Acute respiratory failure with hypoxia: Secondary | ICD-10-CM | POA: Diagnosis present

## 2024-02-02 DIAGNOSIS — N1832 Chronic kidney disease, stage 3b: Secondary | ICD-10-CM | POA: Diagnosis present

## 2024-02-02 DIAGNOSIS — D649 Anemia, unspecified: Secondary | ICD-10-CM | POA: Diagnosis present

## 2024-02-02 DIAGNOSIS — E785 Hyperlipidemia, unspecified: Secondary | ICD-10-CM | POA: Diagnosis present

## 2024-02-02 DIAGNOSIS — D631 Anemia in chronic kidney disease: Secondary | ICD-10-CM | POA: Diagnosis present

## 2024-02-02 DIAGNOSIS — R0989 Other specified symptoms and signs involving the circulatory and respiratory systems: Secondary | ICD-10-CM | POA: Diagnosis not present

## 2024-02-02 DIAGNOSIS — N179 Acute kidney failure, unspecified: Secondary | ICD-10-CM | POA: Diagnosis present

## 2024-02-02 DIAGNOSIS — R918 Other nonspecific abnormal finding of lung field: Secondary | ICD-10-CM | POA: Diagnosis not present

## 2024-02-02 DIAGNOSIS — Z6841 Body Mass Index (BMI) 40.0 and over, adult: Secondary | ICD-10-CM

## 2024-02-02 DIAGNOSIS — I959 Hypotension, unspecified: Secondary | ICD-10-CM | POA: Diagnosis not present

## 2024-02-02 DIAGNOSIS — E1122 Type 2 diabetes mellitus with diabetic chronic kidney disease: Secondary | ICD-10-CM | POA: Diagnosis present

## 2024-02-02 DIAGNOSIS — J90 Pleural effusion, not elsewhere classified: Secondary | ICD-10-CM | POA: Diagnosis not present

## 2024-02-02 DIAGNOSIS — M7989 Other specified soft tissue disorders: Secondary | ICD-10-CM | POA: Diagnosis present

## 2024-02-02 DIAGNOSIS — Z91013 Allergy to seafood: Secondary | ICD-10-CM

## 2024-02-02 DIAGNOSIS — Z83438 Family history of other disorder of lipoprotein metabolism and other lipidemia: Secondary | ICD-10-CM

## 2024-02-02 DIAGNOSIS — I8510 Secondary esophageal varices without bleeding: Secondary | ICD-10-CM | POA: Diagnosis present

## 2024-02-02 DIAGNOSIS — G8929 Other chronic pain: Secondary | ICD-10-CM | POA: Diagnosis present

## 2024-02-02 DIAGNOSIS — R000 Tachycardia, unspecified: Secondary | ICD-10-CM | POA: Diagnosis present

## 2024-02-02 DIAGNOSIS — J189 Pneumonia, unspecified organism: Secondary | ICD-10-CM | POA: Diagnosis present

## 2024-02-02 DIAGNOSIS — Z888 Allergy status to other drugs, medicaments and biological substances status: Secondary | ICD-10-CM

## 2024-02-02 DIAGNOSIS — K921 Melena: Secondary | ICD-10-CM | POA: Diagnosis present

## 2024-02-02 DIAGNOSIS — I130 Hypertensive heart and chronic kidney disease with heart failure and stage 1 through stage 4 chronic kidney disease, or unspecified chronic kidney disease: Secondary | ICD-10-CM | POA: Diagnosis present

## 2024-02-02 DIAGNOSIS — K7581 Nonalcoholic steatohepatitis (NASH): Secondary | ICD-10-CM | POA: Diagnosis present

## 2024-02-02 DIAGNOSIS — Z886 Allergy status to analgesic agent status: Secondary | ICD-10-CM

## 2024-02-02 DIAGNOSIS — I482 Chronic atrial fibrillation, unspecified: Secondary | ICD-10-CM | POA: Diagnosis present

## 2024-02-02 DIAGNOSIS — E872 Acidosis, unspecified: Secondary | ICD-10-CM | POA: Diagnosis present

## 2024-02-02 DIAGNOSIS — D509 Iron deficiency anemia, unspecified: Secondary | ICD-10-CM | POA: Diagnosis not present

## 2024-02-02 DIAGNOSIS — I110 Hypertensive heart disease with heart failure: Secondary | ICD-10-CM | POA: Diagnosis not present

## 2024-02-02 DIAGNOSIS — Z823 Family history of stroke: Secondary | ICD-10-CM

## 2024-02-02 DIAGNOSIS — Z1152 Encounter for screening for COVID-19: Secondary | ICD-10-CM

## 2024-02-02 DIAGNOSIS — Z794 Long term (current) use of insulin: Secondary | ICD-10-CM | POA: Diagnosis not present

## 2024-02-02 DIAGNOSIS — Z7985 Long-term (current) use of injectable non-insulin antidiabetic drugs: Secondary | ICD-10-CM

## 2024-02-02 DIAGNOSIS — Z743 Need for continuous supervision: Secondary | ICD-10-CM | POA: Diagnosis not present

## 2024-02-02 DIAGNOSIS — I5032 Chronic diastolic (congestive) heart failure: Secondary | ICD-10-CM | POA: Diagnosis not present

## 2024-02-02 DIAGNOSIS — I4819 Other persistent atrial fibrillation: Secondary | ICD-10-CM | POA: Diagnosis present

## 2024-02-02 DIAGNOSIS — G4733 Obstructive sleep apnea (adult) (pediatric): Secondary | ICD-10-CM | POA: Diagnosis present

## 2024-02-02 DIAGNOSIS — I509 Heart failure, unspecified: Secondary | ICD-10-CM

## 2024-02-02 DIAGNOSIS — Z885 Allergy status to narcotic agent status: Secondary | ICD-10-CM

## 2024-02-02 DIAGNOSIS — D61818 Other pancytopenia: Secondary | ICD-10-CM | POA: Diagnosis present

## 2024-02-02 DIAGNOSIS — K7460 Unspecified cirrhosis of liver: Principal | ICD-10-CM | POA: Diagnosis present

## 2024-02-02 DIAGNOSIS — Z8249 Family history of ischemic heart disease and other diseases of the circulatory system: Secondary | ICD-10-CM

## 2024-02-02 DIAGNOSIS — Z860100 Personal history of colon polyps, unspecified: Secondary | ICD-10-CM

## 2024-02-02 DIAGNOSIS — I5033 Acute on chronic diastolic (congestive) heart failure: Secondary | ICD-10-CM | POA: Diagnosis present

## 2024-02-02 DIAGNOSIS — Z9049 Acquired absence of other specified parts of digestive tract: Secondary | ICD-10-CM

## 2024-02-02 DIAGNOSIS — I864 Gastric varices: Secondary | ICD-10-CM | POA: Diagnosis present

## 2024-02-02 DIAGNOSIS — K3189 Other diseases of stomach and duodenum: Secondary | ICD-10-CM | POA: Diagnosis present

## 2024-02-02 DIAGNOSIS — Z87891 Personal history of nicotine dependence: Secondary | ICD-10-CM

## 2024-02-02 DIAGNOSIS — E876 Hypokalemia: Secondary | ICD-10-CM | POA: Diagnosis not present

## 2024-02-02 DIAGNOSIS — K529 Noninfective gastroenteritis and colitis, unspecified: Secondary | ICD-10-CM | POA: Diagnosis present

## 2024-02-02 DIAGNOSIS — R0602 Shortness of breath: Secondary | ICD-10-CM | POA: Diagnosis not present

## 2024-02-02 DIAGNOSIS — M797 Fibromyalgia: Secondary | ICD-10-CM | POA: Diagnosis present

## 2024-02-02 DIAGNOSIS — R5381 Other malaise: Secondary | ICD-10-CM | POA: Diagnosis present

## 2024-02-02 DIAGNOSIS — Z801 Family history of malignant neoplasm of trachea, bronchus and lung: Secondary | ICD-10-CM

## 2024-02-02 DIAGNOSIS — R059 Cough, unspecified: Secondary | ICD-10-CM | POA: Diagnosis not present

## 2024-02-02 DIAGNOSIS — I4891 Unspecified atrial fibrillation: Secondary | ICD-10-CM | POA: Diagnosis not present

## 2024-02-02 DIAGNOSIS — R195 Other fecal abnormalities: Secondary | ICD-10-CM | POA: Diagnosis not present

## 2024-02-02 LAB — RETICULOCYTES
Immature Retic Fract: 22 % — ABNORMAL HIGH (ref 2.3–15.9)
RBC.: 2.36 MIL/uL — ABNORMAL LOW (ref 3.87–5.11)
Retic Count, Absolute: 92 10*3/uL (ref 19.0–186.0)
Retic Ct Pct: 3.9 % — ABNORMAL HIGH (ref 0.4–3.1)

## 2024-02-02 LAB — GLUCOSE, CAPILLARY
Glucose-Capillary: 147 mg/dL — ABNORMAL HIGH (ref 70–99)
Glucose-Capillary: 179 mg/dL — ABNORMAL HIGH (ref 70–99)

## 2024-02-02 LAB — FERRITIN: Ferritin: 36 ng/mL (ref 11–307)

## 2024-02-02 LAB — IRON AND TIBC
Iron: 37 ug/dL (ref 28–170)
Saturation Ratios: 10 % — ABNORMAL LOW (ref 10.4–31.8)
TIBC: 379 ug/dL (ref 250–450)
UIBC: 342 ug/dL

## 2024-02-02 LAB — DIGOXIN LEVEL: Digoxin Level: 0.9 ng/mL (ref 0.8–2.0)

## 2024-02-02 LAB — MAGNESIUM: Magnesium: 1.9 mg/dL (ref 1.7–2.4)

## 2024-02-02 LAB — RESP PANEL BY RT-PCR (RSV, FLU A&B, COVID)  RVPGX2
Influenza A by PCR: NEGATIVE
Influenza B by PCR: NEGATIVE
Resp Syncytial Virus by PCR: NEGATIVE
SARS Coronavirus 2 by RT PCR: NEGATIVE

## 2024-02-02 LAB — COMPREHENSIVE METABOLIC PANEL WITH GFR
ALT: 17 U/L (ref 0–44)
AST: 29 U/L (ref 15–41)
Albumin: 3.6 g/dL (ref 3.5–5.0)
Alkaline Phosphatase: 69 U/L (ref 38–126)
Anion gap: 11 (ref 5–15)
BUN: 37 mg/dL — ABNORMAL HIGH (ref 8–23)
CO2: 19 mmol/L — ABNORMAL LOW (ref 22–32)
Calcium: 9.2 mg/dL (ref 8.9–10.3)
Chloride: 102 mmol/L (ref 98–111)
Creatinine, Ser: 2.05 mg/dL — ABNORMAL HIGH (ref 0.44–1.00)
GFR, Estimated: 26 mL/min — ABNORMAL LOW (ref >60)
Glucose, Bld: 182 mg/dL — ABNORMAL HIGH (ref 70–99)
Potassium: 5 mmol/L (ref 3.5–5.1)
Sodium: 132 mmol/L — ABNORMAL LOW (ref 135–145)
Total Bilirubin: 2.4 mg/dL — ABNORMAL HIGH (ref 0.0–1.2)
Total Protein: 6.9 g/dL (ref 6.5–8.1)

## 2024-02-02 LAB — FOLATE: Folate: 19.1 ng/mL (ref >5.9)

## 2024-02-02 LAB — PRO BRAIN NATRIURETIC PEPTIDE: Pro Brain Natriuretic Peptide: 1655 pg/mL — ABNORMAL HIGH (ref <300.0)

## 2024-02-02 LAB — STREP PNEUMONIAE URINARY ANTIGEN: Strep Pneumo Urinary Antigen: NEGATIVE

## 2024-02-02 LAB — LACTIC ACID, PLASMA: Lactic Acid, Venous: 1.9 mmol/L (ref 0.5–1.9)

## 2024-02-02 LAB — PROCALCITONIN: Procalcitonin: 0.23 ng/mL

## 2024-02-02 LAB — HEMOGLOBIN A1C
Hgb A1c MFr Bld: 6 % — ABNORMAL HIGH (ref 4.8–5.6)
Mean Plasma Glucose: 125.5 mg/dL

## 2024-02-02 LAB — VITAMIN B12: Vitamin B-12: 377 pg/mL (ref 180–914)

## 2024-02-02 LAB — PREPARE RBC (CROSSMATCH)

## 2024-02-02 LAB — HIV ANTIBODY (ROUTINE TESTING W REFLEX): HIV Screen 4th Generation wRfx: NONREACTIVE

## 2024-02-02 LAB — TROPONIN T, HIGH SENSITIVITY
Troponin T High Sensitivity: 30 ng/L — ABNORMAL HIGH (ref <19)
Troponin T High Sensitivity: 27 ng/L — ABNORMAL HIGH (ref <19)

## 2024-02-02 LAB — MRSA NEXT GEN BY PCR, NASAL: MRSA by PCR Next Gen: NOT DETECTED

## 2024-02-02 LAB — OCCULT BLOOD X 1 CARD TO LAB, STOOL: Fecal Occult Bld: POSITIVE — AB

## 2024-02-02 MED ORDER — METOPROLOL TARTRATE 25 MG PO TABS
25.0000 mg | ORAL_TABLET | Freq: Two times a day (BID) | ORAL | Status: DC
  Administered 2024-02-02 – 2024-02-07 (×10): 25 mg via ORAL
  Filled 2024-02-02 (×10): qty 1

## 2024-02-02 MED ORDER — INSULIN ASPART 100 UNIT/ML IJ SOLN
0.0000 [IU] | Freq: Three times a day (TID) | INTRAMUSCULAR | Status: DC
  Administered 2024-02-02: 1 [IU] via SUBCUTANEOUS
  Administered 2024-02-03 (×3): 2 [IU] via SUBCUTANEOUS
  Administered 2024-02-04: 1 [IU] via SUBCUTANEOUS
  Administered 2024-02-04: 2 [IU] via SUBCUTANEOUS
  Administered 2024-02-04 – 2024-02-05 (×3): 1 [IU] via SUBCUTANEOUS
  Administered 2024-02-06: 2 [IU] via SUBCUTANEOUS
  Administered 2024-02-06 – 2024-02-07 (×2): 1 [IU] via SUBCUTANEOUS

## 2024-02-02 MED ORDER — RIFAXIMIN 550 MG PO TABS
550.0000 mg | ORAL_TABLET | Freq: Two times a day (BID) | ORAL | Status: DC
  Administered 2024-02-02 – 2024-02-07 (×10): 550 mg via ORAL
  Filled 2024-02-02 (×11): qty 1

## 2024-02-02 MED ORDER — LACTULOSE 10 GM/15ML PO SOLN
20.0000 g | Freq: Three times a day (TID) | ORAL | Status: DC
  Administered 2024-02-02 – 2024-02-07 (×12): 20 g via ORAL
  Filled 2024-02-02 (×16): qty 30

## 2024-02-02 MED ORDER — SODIUM CHLORIDE 0.9 % IV SOLN
2.0000 g | INTRAVENOUS | Status: DC
  Administered 2024-02-03 – 2024-02-06 (×4): 2 g via INTRAVENOUS
  Filled 2024-02-02 (×4): qty 20

## 2024-02-02 MED ORDER — INSULIN ASPART 100 UNIT/ML IJ SOLN: 0.0000 [IU] | Freq: Three times a day (TID) | INTRAMUSCULAR | Status: DC

## 2024-02-02 MED ORDER — SODIUM CHLORIDE 0.9 % IV SOLN
500.0000 mg | INTRAVENOUS | Status: DC
  Filled 2024-02-02: qty 5

## 2024-02-02 MED ORDER — MIDODRINE HCL 5 MG PO TABS
10.0000 mg | ORAL_TABLET | Freq: Three times a day (TID) | ORAL | Status: DC
  Administered 2024-02-02 – 2024-02-07 (×15): 10 mg via ORAL
  Filled 2024-02-02 (×15): qty 2

## 2024-02-02 MED ORDER — PROPYLENE GLYCOL 0.6 % OP SOLN: 1.0000 [drp] | Freq: Two times a day (BID) | OPHTHALMIC | Status: DC

## 2024-02-02 MED ORDER — FUROSEMIDE 10 MG/ML IJ SOLN
40.0000 mg | Freq: Once | INTRAMUSCULAR | Status: AC
  Administered 2024-02-02: 40 mg via INTRAVENOUS
  Filled 2024-02-02: qty 4

## 2024-02-02 MED ORDER — BENZONATATE 100 MG PO CAPS
100.0000 mg | ORAL_CAPSULE | Freq: Three times a day (TID) | ORAL | Status: DC | PRN
  Administered 2024-02-03 – 2024-02-06 (×6): 100 mg via ORAL
  Filled 2024-02-02 (×6): qty 1

## 2024-02-02 MED ORDER — IPRATROPIUM-ALBUTEROL 0.5-2.5 (3) MG/3ML IN SOLN
3.0000 mL | Freq: Once | RESPIRATORY_TRACT | Status: AC
  Administered 2024-02-02: 3 mL via RESPIRATORY_TRACT
  Filled 2024-02-02: qty 3

## 2024-02-02 MED ORDER — SPIRONOLACTONE 25 MG PO TABS
75.0000 mg | ORAL_TABLET | Freq: Every day | ORAL | Status: DC
  Administered 2024-02-02 – 2024-02-07 (×6): 75 mg via ORAL
  Filled 2024-02-02 (×6): qty 3

## 2024-02-02 MED ORDER — SODIUM CHLORIDE 0.9 % IV SOLN
1.0000 g | Freq: Once | INTRAVENOUS | Status: AC
  Administered 2024-02-02: 1 g via INTRAVENOUS
  Filled 2024-02-02: qty 10

## 2024-02-02 MED ORDER — INSULIN ASPART 100 UNIT/ML IJ SOLN: 0.0000 [IU] | Freq: Every day | INTRAMUSCULAR | Status: DC

## 2024-02-02 MED ORDER — METOPROLOL TARTRATE 25 MG PO TABS
25.0000 mg | ORAL_TABLET | Freq: Once | ORAL | Status: AC
  Administered 2024-02-02: 25 mg via ORAL
  Filled 2024-02-02: qty 1

## 2024-02-02 MED ORDER — ONDANSETRON HCL 4 MG PO TABS: 4.0000 mg | ORAL_TABLET | Freq: Four times a day (QID) | ORAL | Status: DC | PRN

## 2024-02-02 MED ORDER — HYDROMORPHONE HCL 1 MG/ML IJ SOLN
0.2500 mg | Freq: Once | INTRAMUSCULAR | Status: AC
  Administered 2024-02-02: 0.25 mg via INTRAVENOUS
  Filled 2024-02-02: qty 1

## 2024-02-02 MED ORDER — PANTOPRAZOLE SODIUM 40 MG PO TBEC
40.0000 mg | DELAYED_RELEASE_TABLET | Freq: Two times a day (BID) | ORAL | Status: DC
  Administered 2024-02-02 – 2024-02-07 (×10): 40 mg via ORAL
  Filled 2024-02-02 (×10): qty 1

## 2024-02-02 MED ORDER — INSULIN GLARGINE-YFGN 100 UNIT/ML ~~LOC~~ SOLN
20.0000 [IU] | Freq: Every day | SUBCUTANEOUS | Status: DC
  Administered 2024-02-03 – 2024-02-07 (×5): 20 [IU] via SUBCUTANEOUS
  Filled 2024-02-02 (×5): qty 0.2

## 2024-02-02 MED ORDER — FUROSEMIDE 10 MG/ML IJ SOLN
20.0000 mg | Freq: Once | INTRAMUSCULAR | Status: AC
  Administered 2024-02-02: 20 mg via INTRAVENOUS
  Filled 2024-02-02: qty 2

## 2024-02-02 MED ORDER — LIDOCAINE 5 % EX PTCH
2.0000 | MEDICATED_PATCH | CUTANEOUS | Status: DC
  Administered 2024-02-02: 2 via TRANSDERMAL
  Filled 2024-02-02: qty 2

## 2024-02-02 MED ORDER — DM-GUAIFENESIN ER 30-600 MG PO TB12
1.0000 | ORAL_TABLET | Freq: Two times a day (BID) | ORAL | Status: DC
  Administered 2024-02-02 – 2024-02-07 (×10): 1 via ORAL
  Filled 2024-02-02 (×10): qty 1

## 2024-02-02 MED ORDER — ORAL CARE MOUTH RINSE: 15.0000 mL | OROMUCOSAL | Status: DC | PRN

## 2024-02-02 MED ORDER — DIGOXIN 125 MCG PO TABS
125.0000 ug | ORAL_TABLET | Freq: Every day | ORAL | Status: DC
  Administered 2024-02-02 – 2024-02-07 (×6): 125 ug via ORAL
  Filled 2024-02-02 (×6): qty 1

## 2024-02-02 MED ORDER — PRESERVISION AREDS 2 PO CAPS: 1.0000 | ORAL_CAPSULE | Freq: Every day | ORAL | Status: DC

## 2024-02-02 MED ORDER — POLYVINYL ALCOHOL 1.4 % OP SOLN: 1.0000 [drp] | OPHTHALMIC | Status: DC | PRN

## 2024-02-02 MED ORDER — IPRATROPIUM-ALBUTEROL 0.5-2.5 (3) MG/3ML IN SOLN
RESPIRATORY_TRACT | Status: AC
  Administered 2024-02-02: 3 mL
  Filled 2024-02-02: qty 3

## 2024-02-02 MED ORDER — INSULIN DEGLUDEC 100 UNIT/ML ~~LOC~~ SOPN: 20.0000 [IU] | PEN_INJECTOR | Freq: Every day | SUBCUTANEOUS | Status: DC

## 2024-02-02 MED ORDER — LEVALBUTEROL HCL 0.63 MG/3ML IN NEBU
0.6300 mg | INHALATION_SOLUTION | RESPIRATORY_TRACT | Status: DC | PRN
  Administered 2024-02-04: 0.63 mg via RESPIRATORY_TRACT
  Filled 2024-02-02: qty 3

## 2024-02-02 MED ORDER — SODIUM CHLORIDE 0.9% IV SOLUTION: Freq: Once | INTRAVENOUS | Status: AC

## 2024-02-02 MED ORDER — ONDANSETRON HCL 4 MG/2ML IJ SOLN: 4.0000 mg | Freq: Four times a day (QID) | INTRAMUSCULAR | Status: DC | PRN

## 2024-02-02 MED ORDER — SODIUM CHLORIDE 0.9 % IV SOLN
500.0000 mg | Freq: Once | INTRAVENOUS | Status: AC
  Administered 2024-02-02: 500 mg via INTRAVENOUS
  Filled 2024-02-02: qty 5

## 2024-02-02 MED ORDER — ALBUTEROL SULFATE HFA 108 (90 BASE) MCG/ACT IN AERS
1.0000 | INHALATION_SPRAY | Freq: Four times a day (QID) | RESPIRATORY_TRACT | Status: DC | PRN
Start: 2024-02-02 — End: 2024-02-02

## 2024-02-02 NOTE — TOC CM/SW Note (Signed)
 Transition of Care [facility]) - Inpatient Brief Assessment   Patient Details  Name: Hannah Khan MRN: [id number] Date of Birth: 05/01/1956  Transition of Care [facility]) CM/SW Contact:    Juliane Och, LCSW Phone Number: 02/02/2024, 4:01 PM   Clinical Narrative:  4:01 PM Per chart review, patient resides at home with spouse. Patient has insurance and a PCP. Patient has SNF history with [facility]. Patient has HH history with [facility] and [facility]. Patient has DME history (oxygen, nebulizer machine, CPAP, rolling walker). No TOC needs were identified at this time. TOC will continue to follow and be available to assist.  Transition of Care Asessment: Insurance and Status: Insurance coverage has been reviewed Patient has primary care physician: Yes Home environment has been reviewed: Private Residence Prior level of function:: N/A Prior/Current Home Services: No current home services (Has HH/DME history) Social Drivers of Health Review: SDOH reviewed no interventions necessary Readmission risk has been reviewed: Yes Transition of care needs: no transition of care needs at this time

## 2024-02-02 NOTE — ED Notes (Signed)
Pt tolerating po intake at this time.  

## 2024-02-02 NOTE — ED Notes (Signed)
 Patient placed on BiPAP at this time per MD. Patient wears CPAP at bedtime at home and is tolerating well. 10/5 30%

## 2024-02-02 NOTE — ED Notes (Signed)
 Arrived to collect 2nd blood culture.  Attempted with iv access and straight stick as well, both failed.  Pts vascular sys is not supporting either.  Pt has productive cough. No complaints, requesting ice chips.

## 2024-02-02 NOTE — Hospital Course (Addendum)
 68 y/o F  with h/o NASH Liver cirrhosis with gastro-esophageal varices, A fib , pancytopenia, CKD 3B b/l bun/creat 21/1.6,severe morbid obesity, A-fib not on anticoagulation, diabetes, hypertension presented to ED 02/02/24 with shortness of breath and upper respiratory symptoms x 1 week, had sick contact with husband.  Patient was seen by PCP, there was "mild congestion" on the chest x-ray.She is short of breath and having cough w/ greenish sputum , no fever, no abdomen pain, no chest pain. She had run out of neb so was using her husband's at home. No focal weakness no nausea vomiting or and has some  loose stool from from lactulose  In the ED: Hemodynamically stable although mild tachycardic, saturating initially room air subsequently needed 2 L [place], afebrile. Labs-creatinine elevated to 2.0 from baseline 1.6, normal LFTs, proBNP 1655 troponin 30> 27, WBC 2.9 hemoglobin 6.7 platelet 25K-baseline value on 07/11/2023 hemoglobin 9 neurology 3.3 platelet 36 but as low as 19K before. Patient noticed to have chronic leg swelling  which is better than before. She has having wheezing right side crepitus  EKG A-fib with RVR CXR pleural effusion with underlying consolidation -atelectasis vs infiltrate. viral panel -ve.  Patient was started on azitho/rocephin /IV lasix /nebs in ED and further admission requested for CHF possible pneumonia and anemia. [place] + and GI consulted.   Subjective: Seen and examined Feels much better and agreeable for discharge home Overnight afebrile BP stable and soft Weight about the same at 289  Labs remains on lower side but stable   Discharge diagnosis:  Acute on chronic anemia, symptomatic Hemoccult positive: pancytopenia from liver cirrhosis, with Hemoccult positive  stool but without overt bleeding/or hematemesis.   S/p 1 u PRBC.Had 2 units in fall and 1 unit in feb.Anemia panel stable B12 folate iron Hb stable ~7 g range.GI has seen the patient and no plan for EGD in absence  of overt bleeding hemoglobin remains low but stable Advised to follow-up with PCP to monitor hemoglobin. Recent Labs  Lab 02/03/24 0812 02/04/24 1115 02/05/24 0204 02/06/24 0240 02/07/24 0212  HGB 7.3* 7.3* 7.4* 7.3* 7.2*  HCT 22.8* 23.9* 23.5* 23.5* 23.2*    CHFpEF w/ mild exacerbation/fluid overload: Last echo with EF 55 to 60% no RWMA RV function normal PTA on metolazone  2.5  PRN, midodrine , metoprolol  torsemide  80 mg in am 40mg  in pm, aldactone  323 dry wt at baseline per her on admit 297lb > down to 291lb >288> 289lb, has chronic leg edema stable Cxray-pleural effusion? underlying consolidation versus atelectasis> repeat chest x-ray: Repeat is unchanged  with mild to moderate diffuse pulmonary congestion>Continue torsemide  40mg  bid, Aldactone , as creatinine continues to improve. Cont to monitor daily I/O,weight, electrolytes and net balance as below.Keep on  salt/fluid restricted diet and monitor in tele. Net IO Since Admission: -6,798.03 mL [02/07/24 0808]  Filed Weights   02/05/24 0351 02/06/24 0439 02/07/24 0500  Weight: 131 kg 131.1 kg 131.1 kg    Recent Labs  Lab 02/02/24 0900 02/02/24 1018 02/03/24 0812 02/04/24 0227 02/05/24 0204 02/06/24 0240 02/07/24 0212  PROBNP 1,655.0*  --   --   --   --   --   --   BUN 37*  --  34* 32* 27* 29* 29*  CREATININE 2.05*  --  2.11* 1.98* 1.93* 1.81* 1.75*  K 5.0  --  4.2 3.4* 3.7 3.7 3.6  MG  --  1.9  --   --   --   --   --     NASH  cirrhosis Pancytopenia History of gastroesophageal varices: No overt bleeding or hematemesis.  GI following continue PPI  She had hematemesis while in [place] in Feb felt to be gastroenteritis and was airflown to [facility].  There was plan for TIPS- sees Dr Jinx Mourning and was aborted in first one due ot fast HR and had 2nd procdure done which was only half. Has appointment at [facility] on June 4 w/  Liver Transplant team. GI following currently   Acute hypoxic respiratory failure: It has resolved     Suspected bacterial CAP Sore throat Viral-like illness: Given her liver cirrhosis, immunocompromise state, managing with IV antibiotics > changed to oral 5/22 creatinine stable afebrile.  Strep throat negative  Encourage PT OT ambulation  AKI on CKD3B Mild hypokalemia Metabolic acidosis due to AKI/CKD: baseline  ~1.6, peaked to 2.1 but downtrending and close to baseline.   Continue diuretics as able and monitor renal function,  avoid hypotension and NSAIDs, monitor electrolytes. Recent Labs  Lab 02/03/24 0812 02/04/24 0227 02/05/24 0204 02/06/24 0240 02/07/24 0212  BUN 34* 32* 27* 29* 29*  CREATININE 2.11* 1.98* 1.93* 1.81* 1.75*    Chronic back pain: Continue pain control.  Lidocaine  patch, added oxycodone  as needed for severe pain   A-fib with RVR: Rate overall stable.  Continue home digoxin , metoprolol , midodrine .Not on anticoagulation PTA.   Hypertension: BP stable, at times soft in 100s.   Diabetes mellitus on long-term insulin  PTA on Tresiba  25 units daily, well controlled on semglee  20 u along with SSI Recent Labs  Lab 02/02/24 1807 02/02/24 2100 02/06/24 0610 02/06/24 1148 02/06/24 1559 02/06/24 2059 02/07/24 0612  GLUCAP  --    < > 122* 157* 115* 174* 104*  HGBA1C 6.0*  --   --   --   --   --   --    < > = values in this interval not displayed.    OSA: Continue home  CPAP    Pancytopenia/thrombocytopenia: Chronic issue, stable monitor and transfuse  prn Recent Labs  Lab 02/05/24 0204 02/06/24 0240 02/07/24 0212  HGB 7.4* 7.3* 7.2*  HCT 23.5* 23.5* 23.2*  WBC 3.1* 3.3* 2.8*  PLT 28* 34* 29*    Morbid obesity with BMI 46: Will benefit with PCP follow-up weight loss healthy lifestyle.  Deconditioning/Debility: PTOT eval requested> advised outpatient PT  Goals of care: Currently full code, she has complex medical comorbidities. Prognosis remains remains guarded, patient is at risk of readmission.

## 2024-02-02 NOTE — H&P (Signed)
 History and Physical    Hannah Khan ZOX:[id number] DOB: [birth year] DOA: 02/02/2024  PCP: Lanae Pinal, MD   Patient coming from: Home  Chief Complaint  Patient presents with   Cough   HPI: 68 y/o F  with h/o NASH Liver cirrhosis with gastro-esophageal varices, A fib , pancytopenia, CKD 3B b/l bun/creat 21/1.6,severe morbid obesity, A-fib not on anticoagulation, diabetes, hypertension presented to ED 02/02/24 with shortness of breath and upper respiratory symptoms x 1 week, had sick contact with husband.  Patient was seen by PCP, there was "mild congestion" on the chest x-ray. She is short of breath and having cough w/ greenish sputum , no fever, no abdomen pain, no chest pain. She had run out of neb so was using her husband's at home. No focal weakness no nausea vomiting or and has some  loose stool from from lactulose  In the ED: Hemodynamically stable although mild tachycardic, saturating initially room air subsequently needed 2 L [place], afebrile. Labs-creatinine elevated to 2.0 from baseline 1.6, normal LFTs, proBNP 1655 troponin 30> 27, WBC 2.9 hemoglobin 6.7 platelet 25K-baseline value on 07/11/2023 hemoglobin 9 neurology 3.3 platelet 36 but as low as 19K before. Patient noticed to have chronic leg swelling  which is better than before. She has having wheezing right side crepitus  EKG A-fib with RVR CXR pleural effusion with underlying consolidation -atelectasis vs infiltrate. viral panel -ve.  Patient was started on azitho/rocephin /IV lasix /nebs in ED and further admission requested for CHF possible pneumonia and anemia. Guaic not done in ED. No melena/hematochezia    ED Course:  Vitals:   02/02/24 1307 02/02/24 1415 02/02/24 1430 02/02/24 1539  BP:  100/66 (!) 110/47 (!) 117/54  Pulse:  (!) 153 (!) 117 (!) 107  Resp:  14 17 20   Temp: 98.3 F (36.8 C) 98.3 F (36.8 C)  97.9 F (36.6 C)  TempSrc: Oral   Oral  SpO2:  98% 98% 100%  Weight:    134.9 kg  Height:    5\' 7"  (1.702  m)     Assessment/Plan Principal Problem:   CHF (congestive heart failure) (HCC)    Assessment and plan:  Acute on chronic anemia, symptomatic: Anemia with pancytopenia from liver cirrhosis.  Transfuse 1 unit PRBC to keep Hb above 7 g, will dose Lasix  in between Check anemia panel, FOBT.Had 2 units in fall and 1 unit in feb. Continue PPI twice daily, monitor hemoglobin. She sees [facility]   CHFpEF chronic: Last echo with EF 55 to 60% no RWMA RV function normal PTA on metolazone  2.5 as needed, midodrine , metoprolol  torsemide  80 mg in am 40mg  in pm, aldactone  323 dry wt at baseline per her now at 297lb.  Chest x-ray appears congested, pleural effusion question underlying consolidation versus atelectasis. Follow-up repeat BMP  in am. Wil ldo iv lasix  with transfusion today and resume torsemide  in am ( has not been taking her torsemide  today) Monitor intake output Daily weight Resume home meds as able.  NASH cirrhosis Pancytopenia History of gastroesophageal varices: Currently no episode of hematemesis.  Resume her home diuretics regimen Aldactone  midodrine , lactulose  and rifaximin  She had hematemesis while in [place] in Feb felt to be gastroenteritis and was airflown to [facility].  There was plan for TIPS- sees Dr Jinx Mourning and was aborted in first one due ot fast HR and had 2nd procdure done which was only half. Has appointment at [facility] on June 4 w/  Liver Transplant team.  Acute hypoxic respiratory failure:  Patient's shortness of breath mild hypoxia in the setting of anemia CHF possible pneumonia.  Continue supplemental oxygen to keep saturation above 92%.  Possible pneumonia: Given her liver cirrhosis, immunocompromise state, continue empiric antibiotics follow-up sputum culture, check procalcitonin   AKI on CKD3B: Creat up form baseline monitor. Holding ivf, doing 1 u prbc repeat labs  Chronic back pain: Continue pain control. Take lidocaine  patch.  A-fib with  RVR: Heart rate mildly elevated.  Resume home digoxin , metoprolol , midodrine .  Not on anticoagulation PTA.  Hypertension: BP stable  Diabetes mellitus on long-term insulin  PTA on Tresiba 25 units daily, resume along with SSI  OSA: Resume home cpap tonight  Pancytopenia/thrombocytopenia: Appears to be chronic issue.  Monitor closely  Body mass index is 46.58 kg/m.   Severity of Illness: The appropriate patient status for this patient is OBSERVATION. Observation status is judged to be reasonable and necessary in order to provide the required intensity of service to ensure the patient's safety. [name] presenting symptoms, physical exam findings, and initial radiographic and laboratory data in the context of their medical condition is felt to place them at decreased risk for further clinical deterioration. Furthermore, it is anticipated that the patient will be medically stable for discharge from the hospital within 2 midnights of admission.    DVT prophylaxis: SCDs Start: 02/02/24 1653 Code Status:   Code Status: Full Code  Family Communication: Admission, patients condition and plan of care including tests being ordered have been discussed with the patient  who indicate understanding and agree with the plan and Code Status.  Consults called:  None   Review of Systems: All systems were reviewed and were negative except as mentioned in HPI above. Negative for fever Negative for chest pain Negative for focal weakness  Past Medical History:  Diagnosis Date   Anemia    hx of   Aortic stenosis 02/2023   Echo 03/07/23 LVEF 55-60%, mild to moderate AS   Arthritis    knees   CHF (congestive heart failure) (HCC)    chronic diastolic   Diabetes mellitus without complication (HCC)    type 2   Dyspnea    Dysrhythmia    Fibromyalgia    Gastric varices    large   H/O transfusion of platelets    Heart murmur    never has caused any problems   Hx of colonic polyps    s/p  partial colectomy   Hyperlipidemia    Hypertension    Joint pain    in knees and back spasms   Left leg swelling    wear compression hose   Liver cirrhosis secondary to NASH (nonalcoholic steatohepatitis) (HCC) dx nov 2014   had enlarged spleen also    Morbid obesity (HCC)    Persistent atrial fibrillation (HCC)    Pneumonia 10/2016   x 5   PONV (postoperative nausea and vomiting)    in past none recent   Portal hypertension (HCC)    Sleep apnea    uses cpap, setting varies between 14-20   Spleen enlarged    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 [facility])     Past Surgical History:  Procedure Laterality Date   BREAST LUMPECTOMY WITH RADIOACTIVE SEED LOCALIZATION Right 08/29/2017   Procedure: RIGHT BREAST LUMPECTOMY WITH RADIOACTIVE SEEDS LOCALIZATION;  Surgeon: Sim Dryer, MD;  Location: [facility] OR;  Service: General;  Laterality: Right;   CESAREAN SECTION  1989   CHOLECYSTECTOMY  20 yrs ago   COLECTOMY  2011  COLONOSCOPY     COLONOSCOPY N/A 09/05/2018   Procedure: COLONOSCOPY;  Surgeon: Evangeline Hilts, MD;  Location: [facility] ENDOSCOPY;  Service: Endoscopy;  Laterality: N/A;   COLONOSCOPY WITH PROPOFOL  Bilateral 05/04/2022   Procedure: COLONOSCOPY WITH PROPOFOL ;  Surgeon: Evangeline Hilts, MD;  Location: [facility] ENDOSCOPY;  Service: Gastroenterology;  Laterality: Bilateral;   DILATATION & CURETTAGE/HYSTEROSCOPY WITH MYOSURE N/A 05/06/2016   Procedure: DILATATION & CURETTAGE/HYSTEROSCOPY WITH MYOSURE WITH POLYPECTOMY;  Surgeon: Keene Pastures, DO;  Location: [facility] ORS;  Service: Gynecology;  Laterality: N/A;   ESOPHAGOGASTRODUODENOSCOPY (EGD) WITH PROPOFOL  N/A 09/04/2013   Procedure: ESOPHAGOGASTRODUODENOSCOPY (EGD) WITH PROPOFOL ;  Surgeon: Evangeline Hilts, MD;  Location: [facility] ENDOS[facility];  Service: Endoscopy;  Laterality: N/A;   ESOPHAGOGASTRODUODENOSCOPY (EGD) WITH PROPOFOL  Bilateral 05/04/2022   Procedure: ESOPHAGOGASTRODUODENOSCOPY (EGD) WITH PROPOFOL ;  Surgeon: Evangeline Hilts, MD;  Location: [facility]  ENDOSCOPY;  Service: Gastroenterology;  Laterality: Bilateral;   IR EMBO ART  VEN HEMORR LYMPH EXTRAV  INC GUIDE ROADMAPPING  06/30/2023   IR INTRAVASCULAR ULTRASOUND NON CORONARY  12/23/2022   IR RADIOLOGIST EVAL & MGMT  09/21/2022   IR RADIOLOGIST EVAL & MGMT  10/06/2022   IR RADIOLOGIST EVAL & MGMT  11/02/2022   IR RADIOLOGIST EVAL & MGMT  01/31/2023   IR RADIOLOGIST EVAL & MGMT  05/31/2023   IR RADIOLOGIST EVAL & MGMT  08/21/2023   IR RADIOLOGIST EVAL & MGMT  12/25/2023   IR TIPS  12/23/2022   IR TIPS  06/30/2023   IR US  GUIDE VASC ACCESS LEFT  06/30/2023   IR US  GUIDE VASC ACCESS RIGHT  12/23/2022   IR US  GUIDE VASC ACCESS RIGHT  06/30/2023   IR US  GUIDE VASC ACCESS RIGHT  06/30/2023   KNEE ARTHROSCOPY  yrs ago   bilateral, one done x 1, one done twice   POLYPECTOMY  09/05/2018   Procedure: POLYPECTOMY;  Surgeon: Evangeline Hilts, MD;  Location: [facility] ENDOS[facility];  Service: Endoscopy;;   RADIOLOGY WITH ANESTHESIA N/A 12/23/2022   Procedure: TIPS;  Surgeon: Federico Hopkins, MD;  Location: [facility] OR;  Service: Radiology;  Laterality: N/A;   RIGHT HEART CATH N/A 05/19/2023   Procedure: RIGHT HEART CATH;  Surgeon: Alwin Baars, DO;  Location: [facility] INVASIVE CV LAB;  Service: Cardiovascular;  Laterality: N/A;   TIPS PROCEDURE N/A 06/30/2023   Procedure: TRANS-JUGULAR INTRAHEPATIC PORTAL SHUNT (TIPS);  Surgeon: Federico Hopkins, MD;  Location: [facility] OR;  Service: Radiology;  Laterality: N/A;     reports that she quit smoking about 49 years ago. Her smoking use included cigarettes. She started smoking about 52 years ago. She has a 3 pack-year smoking history. She has never used smokeless tobacco. She reports that she does not currently use alcohol . She reports that she does not use drugs.  Allergies  Allergen Reactions   Celebrex [Celecoxib] Other (See Comments)    "speech slurred" with celebrex   Shellfish Allergy Swelling, Rash and Other (See Comments)    TINGLING AND SWELLING OF LIPS. LARGE WHELPS,  QUARTER-SIZE   Barium-Containing Compounds Other (See Comments)    TACHYCARDIA   Prednisone Other (See Comments)    TACHYCARDIA   Statins Other (See Comments)    AGGRAVATED FIBROMYALGIA   Fentanyl  Other (See Comments)    Hallucinations    Ibuprofen  Other (See Comments)    UNSPECIFIED SPECIFIC REACTION >> "DUE TO PLATELETS"   Sglt2 Inhibitors Other (See Comments)    Ozempic , Severe gastroparesis    Tramadol  Other (See Comments)    Hallucination    Tylenol  [Acetaminophen ] Other (  See Comments)    UNSPECIFIED SPECIFIC REACTION >> "DUE TO PLATELETS"   Zetia [Ezetimibe] Other (See Comments)    Headache    Family History  Problem Relation Age of Onset   Heart failure Mother    Cancer Mother    Hyperlipidemia Mother    Congestive Heart Failure Mother    Stroke Mother    Lung cancer Father    Cancer Father    Cancer Brother    Hyperlipidemia Brother    Stroke Other    Breast cancer Neg Hx      Prior to Admission medications   Medication Sig Start Date End Date Taking? Authorizing Provider  albuterol  (VENTOLIN  HFA) 108 (90 Base) MCG/ACT inhaler Inhale 1-2 puffs into the lungs every 6 (six) hours as needed for wheezing or shortness of breath.   Yes [provider]  benzonatate  (TESSALON ) 100 MG capsule Take 100 mg by mouth 3 (three) times daily as needed for cough. 01/30/24  Yes [provider]  Continuous Glucose Sensor (FREESTYLE LIBRE 3 PLUS SENSOR) MISC Apply 1 each topically as directed. Place on arm as directed and change every 15 days. 01/12/24  Yes [provider]  digoxin  (LANOXIN ) 0.125 MG tablet Take 1 tablet (125 mcg total) by mouth daily. 08/02/23  Yes Sabharwal, Aditya, DO  lactulose  (CHRONULAC ) 10 GM/15ML solution Take 20 g by mouth 3 (three) times daily. 03/17/21  Yes [provider]  Lancets (ONETOUCH DELICA PLUS LANCET33G) MISC Apply topically 3 (three) times daily. 04/10/20  Yes [provider]  metolazone  (ZAROXOLYN )  2.5 MG tablet TAKE 1 TABLET (2.5 MG TOTAL) BY MOUTH AS NEEDED (AS DIRECTED BY [facility] CLINIC). 10/19/23  Yes Sabharwal, Aditya, DO  metoprolol  tartrate (LOPRESSOR ) 25 MG tablet Take 1 tablet (25 mg total) by mouth 2 (two) times daily. 07/11/23  Yes Elgergawy, Ardia Kraft, MD  midodrine  (PROAMATINE ) 10 MG tablet Take 1 tablet (10 mg total) by mouth 3 (three) times daily. 04/18/23  Yes Clearnce Curia, NP  Multiple Vitamins-Minerals (PRESERVISION AREDS 2) CAPS Take 1 capsule by mouth daily.   Yes [provider]  [facility] VERIO test strip 1 each 3 (three) times daily. 04/13/20  Yes [provider]  pantoprazole  (PROTONIX ) 40 MG tablet Take 40 mg by mouth 2 (two) times daily. 11/17/23  Yes [provider]  potassium chloride  (MICRO-K ) 10 MEQ CR capsule Take 10 capsules (100 mEq total) by mouth 2 (two) times daily. Take an extra 4 tablets when taking Metolazone . 06/15/23  Yes Arleene Belt, PA-C  Propylene Glycol (SYSTANE BALANCE) 0.6 % SOLN Place 1 drop into both eyes 2 (two) times daily.   Yes [provider]  spironolactone  (ALDACTONE ) 25 MG tablet Take 3 tablets (75 mg total) by mouth daily. Patient taking differently: Take 25 mg by mouth daily. Take with 50 mg tablet for a total dose of 75 mg daily. 12/06/23  Yes Sabharwal, Aditya, DO  spironolactone  (ALDACTONE ) 50 MG tablet Take 50 mg by mouth daily. Take with 25 mg tablet for a total dose of 75 mg daily. 01/18/24  Yes [provider]  torsemide  (DEMADEX ) 20 MG tablet Take 5 tablets (100 mg total) by mouth every morning for 5 days, THEN 2 tablets (40 mg total) every evening for 5 days, THEN 5 tablets (100 mg total) daily. Patient taking differently: Take 4 tablets (80 mg total) by mouth in the morning and 2 tablets (40 mg total) in the evening 08/02/23 08/11/24 Yes Sabharwal, Aditya, DO  TRESIBA FLEXTOUCH 100 UNIT/ML FlexTouch Pen Inject 24 Units into the skin daily. (TITRATE WHEN DIRECTED UP TO 50 UNITS) 01/22/24   Yes [provider]  XIFAXAN  550 MG TABS tablet Take 550 mg by mouth 2 (two) times daily. 03/13/21  Yes [provider]    Physical Exam: Vitals:   02/02/24 1307 02/02/24 1415 02/02/24 1430 02/02/24 1539  BP:  100/66 (!) 110/47 (!) 117/54  Pulse:  (!) 153 (!) 117 (!) 107  Resp:  14 17 20   Temp: 98.3 F (36.8 C) 98.3 F (36.8 C)  97.9 F (36.6 C)  TempSrc: Oral   Oral  SpO2:  98% 98% 100%  Weight:    134.9 kg  Height:    5\' 7"  (1.702 m)   General exam: AAOx3 , NAD, weak appearing. HEENT:Oral mucosa moist, Ear/Nose WNL grossly, dentition normal. Respiratory system: bilaterally diminished,no wheezing or crackles,no use of accessory muscle Cardiovascular system: S1 & S2 +, No JVD,. Gastrointestinal system: Abdomen soft, NT,ND, BS+ Nervous System:Alert, awake, moving extremities and grossly nonfocal Extremities: edema++, distal peripheral pulses palpable.  Skin: No rashes,no icterus. MSK: Normal muscle bulk,tone, power   Labs on Admission: I have personally reviewed following labs and imaging studies  CBC: Recent Labs  Lab 02/02/24 0900  WBC 2.9*  NEUTROABS 2.2  HGB 6.7*  HCT 21.0*  MCV 91.3  PLT 25*   Basic Metabolic Panel: Recent Labs  Lab 02/02/24 0900 02/02/24 1018  NA 132*  --   K 5.0  --   CL 102  --   CO2 19*  --   GLUCOSE 182*  --   BUN 37*  --   CREATININE 2.05*  --   CALCIUM  9.2  --   MG  --  1.9   Estimated Creatinine Clearance: 38.2 mL/min (A) (by C-G formula based on SCr of 2.05 mg/dL (H)). Recent Labs  Lab 02/02/24 0900  AST 29  ALT 17  ALKPHOS 69  BILITOT 2.4*  PROT 6.9  ALBUMIN  3.6   No results for input(s): "CKTOTAL", "CKMB", "TROPONINIHS", "RELINDX" in the last 72 hours.  BNP (last 3 results) Recent Labs    02/02/24 0900  PROBNP 1,655.0*   No results for input(s): "TSH", "T4TOTAL", "FREET4", "T3FREE", "THYROIDAB" in the last 72 hours. Urine analysis:    Component Value Date/Time   COLORURINE AMBER (A)  07/04/2023 0609   APPEARANCEUR HAZY (A) 07/04/2023 0609   LABSPEC 1.013 07/04/2023 0609   PHURINE 5.0 07/04/2023 0609   GLUCOSEU NEGATIVE 07/04/2023 0609   HGBUR NEGATIVE 07/04/2023 0609   BILIRUBINUR NEGATIVE 07/04/2023 0609   KETONESUR NEGATIVE 07/04/2023 0609   PROTEINUR NEGATIVE 07/04/2023 0609   UROBILINOGEN 2.0 (H) 01/31/2015 0055   NITRITE NEGATIVE 07/04/2023 0609   LEUKOCYTESUR NEGATIVE 07/04/2023 0609    Radiological Exams on Admission: DG Chest 2 View Result Date: 02/02/2024 CLINICAL DATA:  Cough. EXAM: CHEST - 2 VIEW COMPARISON:  01/30/2024. FINDINGS: Mild diffuse prominence of interstitial markings is nonspecific and appears grossly similar to the prior study. Redemonstration of left basilar opacity, partially obscuring the left lateral hemidiaphragm and blunting the left lateral costophrenic angle, which may suggest combination of left lung lower lobe atelectasis and/or consolidation with pleural effusion. No significant interval change. Bilateral lungs are otherwise grossly clear. No acute consolidation or lung collapse. Right costophrenic angle is clear. Stable cardio-mediastinal silhouette. No acute osseous abnormalities. The soft tissues are within normal limits. IMPRESSION: *Redemonstration of left basilar opacity, which may represent combination of left lung lower  lobe atelectasis and/or consolidation with pleural effusion. No significant interval change. Mild diffuse prominence of interstitial markings is nonspecific but grossly similar to the prior study. Electronically Signed   By: Beula Brunswick M.D.   On: 02/02/2024 09:07   Lesa Rape MD [facility]  If 7PM-7AM, please contact night-coverage [email or web address]  02/02/2024, 4:55 PM

## 2024-02-02 NOTE — ED Triage Notes (Signed)
 Pt states husband was sick last week, she started having Sx on Monday. Productive cough, nasal congestion, fatigue. Seen by PCP

## 2024-02-02 NOTE — Plan of Care (Signed)

## 2024-02-02 NOTE — ED Notes (Signed)
 Patient taken off BiPAP per her request. Placed on [facility]

## 2024-02-02 NOTE — ED Notes (Signed)
 Patient transported to X-ray

## 2024-02-02 NOTE — ED Provider Notes (Signed)
 [place] [facility] Provider Note   CSN: [id number] Arrival date & time: 02/02/24  0409     History  Chief Complaint  Patient presents with   Cough    Hannah Khan is a 68 y.o. female.  HPI Medical history significant of A-fib not on anticoagulation, CHF, hypertension, type 2 diabetes, CKD stage IIIb, Nash cirrhosis, gastroesophageal varices, chronic thrombocytopenia, anemia, abdominal wall cellulitis, severe morbid obesity  Patient reports becoming ill 5 days ago.  She has had harsh cough and shortness of breath.  She reports cough is productive of brown thick sputum.  She reports chest pain with coughing.  Mild sore throat.  No fever.  No vomiting.  Patient reports that her husband became ill with a respiratory illness about 3 days before she did.  He tested negative for COVID\RSV\flu.  He was empirically treated with steroids and antibiotics.  She reports she has been seen by her PCP and given Tessalon  Perles and tried an albuterol  inhaler.  Symptoms are worsening and she has become significantly more short of breath and cough is increasing.  Patient reports that she has congestive heart failure and takes 8 torsemide  tablets per day.  She reports when seen by PCP there was "mild congestion" on the chest x-ray but no change in medication dosing at that time.  Patient does not use inhalers at baseline and does not use home oxygen.  Patient denies vomiting.  She reports the last time she had low hemoglobin she had hematemesis.  She has not had any upper abdominal pain.  The patient reports her stools are always "funky looking" because she takes lactulose  and has frequent loose bowel movements.  Denies BM has looked black or bloody.    Home Medications Prior to Admission medications   Medication Sig Start Date End Date Taking? Authorizing Provider  albuterol  (VENTOLIN  HFA) 108 (90 Base) MCG/ACT inhaler Inhale 1-2 puffs into the lungs every 6 (six)  hours as needed for wheezing or shortness of breath.    [provider]  digoxin  (LANOXIN ) 0.125 MG tablet Take 1 tablet (125 mcg total) by mouth daily. 08/02/23   Sabharwal, Aditya, DO  lactulose  (CHRONULAC ) 10 GM/15ML solution Take 20 g by mouth 3 (three) times daily. 03/17/21   [provider]  Lancets (ONETOUCH DELICA PLUS [place]) MISC Apply topically 3 (three) times daily. 04/10/20   [provider]  metolazone  (ZAROXOLYN ) 2.5 MG tablet TAKE 1 TABLET (2.5 MG TOTAL) BY MOUTH AS NEEDED (AS DIRECTED BY [facility]). 10/19/23   Sabharwal, Aditya, DO  metoprolol  tartrate (LOPRESSOR ) 25 MG tablet Take 1 tablet (25 mg total) by mouth 2 (two) times daily. 07/11/23   Elgergawy, Ardia Kraft, MD  midodrine  (PROAMATINE ) 10 MG tablet Take 1 tablet (10 mg total) by mouth 3 (three) times daily. 04/18/23   Walker, Caitlin S, NP  Multiple Vitamins-Minerals (PRESERVISION AREDS 2) CAPS Take 1 capsule by mouth 2 (two) times daily.    [provider]  [facility] VERIO test strip 1 each 3 (three) times daily. 04/13/20   [provider]  pantoprazole  (PROTONIX ) 40 MG tablet Take 40 mg by mouth 2 (two) times daily. 11/17/23   [provider]  potassium chloride  (MICRO-K ) 10 MEQ CR capsule Take 10 capsules (100 mEq total) by mouth 2 (two) times daily. Take an extra 4 tablets when taking Metolazone . 06/15/23   Arleene Belt, PA-C  Propylene Glycol (SYSTANE BALANCE) 0.6 % SOLN Place 1 drop into  both eyes 2 (two) times daily.    [provider]  spironolactone  (ALDACTONE ) 25 MG tablet Take 3 tablets (75 mg total) by mouth daily. 12/06/23   Sabharwal, Aditya, DO  torsemide  (DEMADEX ) 20 MG tablet Take 5 tablets (100 mg total) by mouth every morning for 5 days, THEN 2 tablets (40 mg total) every evening for 5 days, THEN 5 tablets (100 mg total) daily. Patient taking differently: Take 5 tablets (100 mg total) by mouth daily 08/02/23 08/11/24  Sabharwal, Aditya, DO   XIFAXAN  550 MG TABS tablet Take 550 mg by mouth 2 (two) times daily. 03/13/21   [provider]      Allergies    Celebrex [celecoxib], Shellfish allergy, Barium-containing compounds, Prednisone, Statins, Fentanyl , Ibuprofen , Sglt2 inhibitors, Tramadol , Tylenol  [acetaminophen ], and Zetia [ezetimibe]    Review of Systems   Review of Systems  Physical Exam Updated Vital Signs BP (!) 115/53   Pulse (!) 126   Temp 98.3 F (36.8 C) (Oral)   Resp (!) 23   Ht 5\' 7"  (1.702 m)   Wt 136.1 kg   SpO2 100%   BMI 46.99 kg/m  Physical Exam Constitutional:      Comments: Alert.  Mental status clear.  Nontoxic.  Mild to moderate increased work of breathing at rest.  HENT:     Mouth/Throat:     Mouth: Mucous membranes are dry.     Pharynx: Oropharynx is clear.  Eyes:     Extraocular Movements: Extraocular movements intact.  Cardiovascular:     Comments: Borderline tachycardia.   3/6 systolic ejection murmur. Pulmonary:     Comments: Mild to moderate increased work of breathing.  Harsh cough.  Focal coarse inspiratory expiratory wheeze right midlung field.  Left grossly clear. Abdominal:     General: There is no distension.     Palpations: Abdomen is soft.     Tenderness: There is no abdominal tenderness.  Musculoskeletal:     Cervical back: Neck supple.     Comments: Changes of chronic venous stasis bilateral lower extremities.  Skin thickening.  Minor pretibial wounds with no appearance of immediate secondary infection.  Neurological:     General: No focal deficit present.     Mental Status: She is oriented to person, place, and time.     Coordination: Coordination normal.  Psychiatric:        Mood and Affect: Mood normal.    ED Results / Procedures / Treatments   Labs (all labs ordered are listed, but only abnormal results are displayed) Labs Reviewed  PRO BRAIN NATRIURETIC PEPTIDE - Abnormal; Notable for the following components:      Result Value   Pro Brain  Natriuretic Peptide 1,655.0 (*)    All other components within normal limits  COMPREHENSIVE METABOLIC PANEL WITH GFR - Abnormal; Notable for the following components:   Sodium 132 (*)    CO2 19 (*)    Glucose, Bld 182 (*)    BUN 37 (*)    Creatinine, Ser 2.05 (*)    Total Bilirubin 2.4 (*)    GFR, Estimated 26 (*)    All other components within normal limits  CBC WITH DIFFERENTIAL/PLATELET - Abnormal; Notable for the following components:   WBC 2.9 (*)    RBC 2.30 (*)    Hemoglobin 6.7 (*)    HCT 21.0 (*)    Platelets 25 (*)    Lymphs Abs 0.3 (*)    All other components within normal limits  TROPONIN  T, HIGH SENSITIVITY - Abnormal; Notable for the following components:   Troponin T High Sensitivity 30 (*)    All other components within normal limits  TROPONIN T, HIGH SENSITIVITY - Abnormal; Notable for the following components:   Troponin T High Sensitivity 27 (*)    All other components within normal limits  RESP PANEL BY RT-PCR (RSV, FLU A&B, COVID)  RVPGX2  CULTURE, BLOOD (ROUTINE X 2)  CULTURE, BLOOD (ROUTINE X 2)  LACTIC ACID, PLASMA  DIGOXIN  LEVEL    EKG EKG Interpretation Date/Time:  Friday Feb 02 2024 08:21:00 EDT Ventricular Rate:  108 PR Interval:    QRS Duration:  91 QT Interval:  318 QTC Calculation: 427 R Axis:   -54  Text Interpretation: Atrial fibrillation Left anterior fascicular block Probable anteroseptal infarct, old Nonspecific T abnormalities, lateral leads agree, no sig change from previous Confirmed by Wynetta Heckle [phone]) on 02/02/2024 8:25:55 AM  Radiology DG Chest 2 View Result Date: 02/02/2024 CLINICAL DATA:  Cough. EXAM: CHEST - 2 VIEW COMPARISON:  01/30/2024. FINDINGS: Mild diffuse prominence of interstitial markings is nonspecific and appears grossly similar to the prior study. Redemonstration of left basilar opacity, partially obscuring the left lateral hemidiaphragm and blunting the left lateral costophrenic angle, which may suggest  combination of left lung lower lobe atelectasis and/or consolidation with pleural effusion. No significant interval change. Bilateral lungs are otherwise grossly clear. No acute consolidation or lung collapse. Right costophrenic angle is clear. Stable cardio-mediastinal silhouette. No acute osseous abnormalities. The soft tissues are within normal limits. IMPRESSION: *Redemonstration of left basilar opacity, which may represent combination of left lung lower lobe atelectasis and/or consolidation with pleural effusion. No significant interval change. Mild diffuse prominence of interstitial markings is nonspecific but grossly similar to the prior study. Electronically Signed   By: Beula Brunswick M.D.   On: 02/02/2024 09:07    Procedures Procedures   CRITICAL CARE Performed by: Wynetta Heckle   Total critical care time: 30 minutes  Critical care time was exclusive of separately billable procedures and treating other patients.  Critical care was necessary to treat or prevent imminent or life-threatening deterioration.  Critical care was time spent personally by me on the following activities: development of treatment plan with patient and/or surrogate as well as nursing, discussions with consultants, evaluation of patient's response to treatment, examination of patient, obtaining history from patient or surrogate, ordering and performing treatments and interventions, ordering and review of laboratory studies, ordering and review of radiographic studies, pulse oximetry and re-evaluation of patient's condition.  Medications Ordered in ED Medications  azithromycin  (ZITHROMAX ) 500 mg in sodium chloride  0.9 % 250 mL IVPB (500 mg Intravenous New Bag/Given 02/02/24 1115)  furosemide  (LASIX ) injection 40 mg (has no administration in time range)  metoprolol  tartrate (LOPRESSOR ) tablet 25 mg (has no administration in time range)  ipratropium-albuterol  (DUONEB) 0.5-2.5 (3) MG/3ML nebulizer solution 3 mL (3 mLs  Nebulization Given 02/02/24 0801)  cefTRIAXone  (ROCEPHIN ) 1 g in sodium chloride  0.9 % 100 mL IVPB (0 g Intravenous Stopped 02/02/24 1114)    ED Course/ Medical Decision Making/ A&P                                 Medical Decision Making Amount and/or Complexity of Data Reviewed Labs: ordered. Radiology: ordered.  Risk Prescription drug management. Decision regarding hospitalization.   Patient presents as outlined.  She is visibly dyspneic in appearance and has harsh  cough.  Patient has comorbid condition of CHF.  Exam is positive for focal wheezing.  Differential diagnosis includes pneumonia\CHF.  Will proceed with chest x-ray and lab work.  Will empirically trial DuoNeb for wheezing and cough.  Patient's hemoglobin is at 6.7 g/dL. patient reports labs done on outpatient basis by PCP had hemoglobin of 7.5 gram per deciliter.  At this time, symptomatic anemia appears to be a contributing factor to significant dyspnea.  Patient has prior history of GI bleed and is not anticoagulated despite chronic atrial fibrillation.  Patient will need blood transfusion but at this time, does not require emergent transfusion of uncrossmatched blood.  Will plan for admission.  Chest x-ray shows area of consolidation versus atelectasis.  With patient's description of significant productive cough will initiate antibiotic therapy with Rocephin  and Zithromax .  Consult: Reviewed with Dr. Oris [facility] hospitalist for admission.  Recommends administration 1 dose of Lasix  40 mg IV and patient's home oral dose of metoprolol .  Patient's heart rates have been variable atrial fibrillation at rest occasionally in the 90s but with activity up to 140s.  Patient reports she does feel dyspneic and wears CPAP at night.  Will apply BiPAP with Lasix . Patient is agreeable to trying it for added diuresis and symptomatic treatment for shortness of breath.  13: 10 patient reports that she needs something for her lower back pain.   She reports she has chronic pain and is pretty poorly managed.  She is uncomfortable sitting in the stretcher.  She reports she usually applies lidocaine  patches which are not very helpful.  She reports tramadol  has made her confused in the past.  She cannot take acetaminophen  due to chronic liver failure.  Will try lidocaine  patches on the low back and very small dose Dilaudid  0.25.  Patient has been up to the bathroom.  Produced a bowel movement green stool.  Occult stool pending.  Patient tolerated BiPAP but had coughing episodes that were alarming the machine a lot.  At this time she is reasonably comfortable off of it.  Oxygen saturation is 100% heart rate is 109.  Patient is sitting in the stretcher speaking in full sentences.  At this time we will not reapply BiPAP due to stable off and still having cough paroxysms intermittently.       Final Clinical Impression(s) / ED Diagnoses Final diagnoses:  Symptomatic anemia  Pneumonia due to infectious organism, unspecified laterality, unspecified part of lung  Acute on chronic congestive heart failure, unspecified heart failure type (HCC)  Chronic atrial fibrillation (HCC)    Rx / DC Orders ED Discharge Orders     None         Wynetta Heckle, MD 02/02/24 1313

## 2024-02-03 ENCOUNTER — Observation Stay (HOSPITAL_COMMUNITY)

## 2024-02-03 DIAGNOSIS — Z9049 Acquired absence of other specified parts of digestive tract: Secondary | ICD-10-CM

## 2024-02-03 DIAGNOSIS — E876 Hypokalemia: Secondary | ICD-10-CM | POA: Diagnosis not present

## 2024-02-03 DIAGNOSIS — E1122 Type 2 diabetes mellitus with diabetic chronic kidney disease: Secondary | ICD-10-CM | POA: Diagnosis present

## 2024-02-03 DIAGNOSIS — Z885 Allergy status to narcotic agent status: Secondary | ICD-10-CM

## 2024-02-03 DIAGNOSIS — G4733 Obstructive sleep apnea (adult) (pediatric): Secondary | ICD-10-CM | POA: Diagnosis present

## 2024-02-03 DIAGNOSIS — I4819 Other persistent atrial fibrillation: Secondary | ICD-10-CM | POA: Diagnosis present

## 2024-02-03 DIAGNOSIS — Z794 Long term (current) use of insulin: Secondary | ICD-10-CM

## 2024-02-03 DIAGNOSIS — J189 Pneumonia, unspecified organism: Secondary | ICD-10-CM | POA: Diagnosis present

## 2024-02-03 DIAGNOSIS — K766 Portal hypertension: Secondary | ICD-10-CM | POA: Diagnosis present

## 2024-02-03 DIAGNOSIS — N1832 Chronic kidney disease, stage 3b: Secondary | ICD-10-CM | POA: Diagnosis present

## 2024-02-03 DIAGNOSIS — K7581 Nonalcoholic steatohepatitis (NASH): Secondary | ICD-10-CM | POA: Diagnosis present

## 2024-02-03 DIAGNOSIS — J9601 Acute respiratory failure with hypoxia: Secondary | ICD-10-CM | POA: Diagnosis present

## 2024-02-03 DIAGNOSIS — K7460 Unspecified cirrhosis of liver: Principal | ICD-10-CM | POA: Diagnosis present

## 2024-02-03 DIAGNOSIS — I864 Gastric varices: Secondary | ICD-10-CM | POA: Diagnosis present

## 2024-02-03 DIAGNOSIS — E785 Hyperlipidemia, unspecified: Secondary | ICD-10-CM | POA: Diagnosis present

## 2024-02-03 DIAGNOSIS — I130 Hypertensive heart and chronic kidney disease with heart failure and stage 1 through stage 4 chronic kidney disease, or unspecified chronic kidney disease: Secondary | ICD-10-CM | POA: Diagnosis present

## 2024-02-03 DIAGNOSIS — I5033 Acute on chronic diastolic (congestive) heart failure: Secondary | ICD-10-CM | POA: Diagnosis present

## 2024-02-03 DIAGNOSIS — I8510 Secondary esophageal varices without bleeding: Secondary | ICD-10-CM | POA: Diagnosis present

## 2024-02-03 DIAGNOSIS — R5381 Other malaise: Secondary | ICD-10-CM | POA: Diagnosis present

## 2024-02-03 DIAGNOSIS — M7989 Other specified soft tissue disorders: Secondary | ICD-10-CM | POA: Diagnosis present

## 2024-02-03 DIAGNOSIS — G8929 Other chronic pain: Secondary | ICD-10-CM | POA: Diagnosis present

## 2024-02-03 DIAGNOSIS — R918 Other nonspecific abnormal finding of lung field: Secondary | ICD-10-CM | POA: Diagnosis not present

## 2024-02-03 DIAGNOSIS — N179 Acute kidney failure, unspecified: Secondary | ICD-10-CM | POA: Diagnosis present

## 2024-02-03 DIAGNOSIS — I350 Nonrheumatic aortic (valve) stenosis: Secondary | ICD-10-CM | POA: Diagnosis present

## 2024-02-03 DIAGNOSIS — K529 Noninfective gastroenteritis and colitis, unspecified: Secondary | ICD-10-CM | POA: Diagnosis present

## 2024-02-03 DIAGNOSIS — Z79899 Other long term (current) drug therapy: Secondary | ICD-10-CM

## 2024-02-03 DIAGNOSIS — D631 Anemia in chronic kidney disease: Secondary | ICD-10-CM | POA: Diagnosis present

## 2024-02-03 DIAGNOSIS — I5032 Chronic diastolic (congestive) heart failure: Secondary | ICD-10-CM | POA: Diagnosis not present

## 2024-02-03 DIAGNOSIS — Z6841 Body Mass Index (BMI) 40.0 and over, adult: Secondary | ICD-10-CM

## 2024-02-03 DIAGNOSIS — D61818 Other pancytopenia: Secondary | ICD-10-CM | POA: Diagnosis present

## 2024-02-03 DIAGNOSIS — Z1152 Encounter for screening for COVID-19: Secondary | ICD-10-CM | POA: Diagnosis not present

## 2024-02-03 DIAGNOSIS — R0989 Other specified symptoms and signs involving the circulatory and respiratory systems: Secondary | ICD-10-CM | POA: Diagnosis not present

## 2024-02-03 DIAGNOSIS — R000 Tachycardia, unspecified: Secondary | ICD-10-CM | POA: Diagnosis present

## 2024-02-03 DIAGNOSIS — Z860100 Personal history of colon polyps, unspecified: Secondary | ICD-10-CM

## 2024-02-03 DIAGNOSIS — Z888 Allergy status to other drugs, medicaments and biological substances status: Secondary | ICD-10-CM

## 2024-02-03 DIAGNOSIS — E872 Acidosis, unspecified: Secondary | ICD-10-CM | POA: Diagnosis present

## 2024-02-03 DIAGNOSIS — Z83438 Family history of other disorder of lipoprotein metabolism and other lipidemia: Secondary | ICD-10-CM

## 2024-02-03 DIAGNOSIS — Z91013 Allergy to seafood: Secondary | ICD-10-CM

## 2024-02-03 DIAGNOSIS — Z801 Family history of malignant neoplasm of trachea, bronchus and lung: Secondary | ICD-10-CM

## 2024-02-03 DIAGNOSIS — Z886 Allergy status to analgesic agent status: Secondary | ICD-10-CM

## 2024-02-03 DIAGNOSIS — K921 Melena: Secondary | ICD-10-CM | POA: Diagnosis present

## 2024-02-03 DIAGNOSIS — Z91148 Patient's other noncompliance with medication regimen for other reason: Secondary | ICD-10-CM

## 2024-02-03 DIAGNOSIS — Z7985 Long-term (current) use of injectable non-insulin antidiabetic drugs: Secondary | ICD-10-CM

## 2024-02-03 DIAGNOSIS — Z87891 Personal history of nicotine dependence: Secondary | ICD-10-CM

## 2024-02-03 DIAGNOSIS — Z8249 Family history of ischemic heart disease and other diseases of the circulatory system: Secondary | ICD-10-CM

## 2024-02-03 DIAGNOSIS — K3189 Other diseases of stomach and duodenum: Secondary | ICD-10-CM | POA: Diagnosis present

## 2024-02-03 DIAGNOSIS — Z823 Family history of stroke: Secondary | ICD-10-CM

## 2024-02-03 DIAGNOSIS — I509 Heart failure, unspecified: Secondary | ICD-10-CM | POA: Diagnosis not present

## 2024-02-03 DIAGNOSIS — J90 Pleural effusion, not elsewhere classified: Secondary | ICD-10-CM | POA: Diagnosis not present

## 2024-02-03 DIAGNOSIS — M797 Fibromyalgia: Secondary | ICD-10-CM | POA: Diagnosis present

## 2024-02-03 LAB — BASIC METABOLIC PANEL WITH GFR
Anion gap: 12 (ref 5–15)
BUN: 34 mg/dL — ABNORMAL HIGH (ref 8–23)
CO2: 20 mmol/L — ABNORMAL LOW (ref 22–32)
Calcium: 9.1 mg/dL (ref 8.9–10.3)
Chloride: 100 mmol/L (ref 98–111)
Creatinine, Ser: 2.11 mg/dL — ABNORMAL HIGH (ref 0.44–1.00)
GFR, Estimated: 25 mL/min — ABNORMAL LOW (ref >60)
Glucose, Bld: 206 mg/dL — ABNORMAL HIGH (ref 70–99)
Potassium: 4.2 mmol/L (ref 3.5–5.1)
Sodium: 132 mmol/L — ABNORMAL LOW (ref 135–145)

## 2024-02-03 LAB — CBC
HCT: 22.8 % — ABNORMAL LOW (ref 36.0–46.0)
Hemoglobin: 7.3 g/dL — ABNORMAL LOW (ref 12.0–15.0)
MCH: 29.1 pg (ref 26.0–34.0)
MCHC: 32 g/dL (ref 30.0–36.0)
MCV: 90.8 fL (ref 80.0–100.0)
Platelets: 30 10*3/uL — ABNORMAL LOW (ref 150–400)
RBC: 2.51 MIL/uL — ABNORMAL LOW (ref 3.87–5.11)
RDW: 15.5 % (ref 11.5–15.5)
WBC: 3 10*3/uL — ABNORMAL LOW (ref 4.0–10.5)
nRBC: 0 % (ref 0.0–0.2)

## 2024-02-03 LAB — CBC WITH DIFFERENTIAL/PLATELET
Abs Immature Granulocytes: 0.02 10*3/uL (ref 0.00–0.07)
Basophils Absolute: 0 10*3/uL (ref 0.0–0.1)
Basophils Relative: 0 %
Eosinophils Absolute: 0.1 10*3/uL (ref 0.0–0.5)
Eosinophils Relative: 2 %
HCT: 21 % — ABNORMAL LOW (ref 36.0–46.0)
Hemoglobin: 6.7 g/dL — CL (ref 12.0–15.0)
Immature Granulocytes: 1 %
Lymphocytes Relative: 10 %
Lymphs Abs: 0.3 10*3/uL — ABNORMAL LOW (ref 0.7–4.0)
MCH: 29.1 pg (ref 26.0–34.0)
MCHC: 31.9 g/dL (ref 30.0–36.0)
MCV: 91.3 fL (ref 80.0–100.0)
Monocytes Absolute: 0.4 10*3/uL (ref 0.1–1.0)
Monocytes Relative: 14 %
Neutro Abs: 2.2 10*3/uL (ref 1.7–7.7)
Neutrophils Relative %: 73 %
Platelets: 25 10*3/uL — CL (ref 150–400)
RBC: 2.3 MIL/uL — ABNORMAL LOW (ref 3.87–5.11)
RDW: 15.2 % (ref 11.5–15.5)
WBC: 2.9 10*3/uL — ABNORMAL LOW (ref 4.0–10.5)
nRBC: 0 % (ref 0.0–0.2)

## 2024-02-03 LAB — EXPECTORATED SPUTUM ASSESSMENT W GRAM STAIN, RFLX TO RESP C

## 2024-02-03 LAB — GLUCOSE, CAPILLARY
Glucose-Capillary: 166 mg/dL — ABNORMAL HIGH (ref 70–99)
Glucose-Capillary: 158 mg/dL — ABNORMAL HIGH (ref 70–99)
Glucose-Capillary: 164 mg/dL — ABNORMAL HIGH (ref 70–99)
Glucose-Capillary: 159 mg/dL — ABNORMAL HIGH (ref 70–99)

## 2024-02-03 MED ORDER — LIDOCAINE 5 % EX PTCH
3.0000 | MEDICATED_PATCH | CUTANEOUS | Status: DC
  Administered 2024-02-05: 3 via TRANSDERMAL
  Filled 2024-02-03: qty 3

## 2024-02-03 MED ORDER — LIDOCAINE 5 % EX PTCH
1.0000 | MEDICATED_PATCH | Freq: Once | CUTANEOUS | Status: AC
  Administered 2024-02-03: 1 via TRANSDERMAL
  Filled 2024-02-03: qty 1

## 2024-02-03 MED ORDER — AZITHROMYCIN 500 MG PO TABS
500.0000 mg | ORAL_TABLET | Freq: Every day | ORAL | Status: AC
  Administered 2024-02-03 – 2024-02-07 (×5): 500 mg via ORAL
  Filled 2024-02-03 (×5): qty 1

## 2024-02-03 MED ORDER — TORSEMIDE 20 MG PO TABS
40.0000 mg | ORAL_TABLET | Freq: Two times a day (BID) | ORAL | Status: DC
  Administered 2024-02-03 – 2024-02-07 (×9): 40 mg via ORAL
  Filled 2024-02-03 (×9): qty 2

## 2024-02-03 MED ORDER — LIDOCAINE 5 % EX PTCH: 3.0000 | MEDICATED_PATCH | CUTANEOUS | Status: DC

## 2024-02-03 MED ORDER — OXYCODONE HCL 5 MG PO TABS
5.0000 mg | ORAL_TABLET | Freq: Four times a day (QID) | ORAL | Status: DC | PRN
  Administered 2024-02-03 – 2024-02-06 (×7): 5 mg via ORAL
  Filled 2024-02-03 (×9): qty 1

## 2024-02-03 NOTE — Consult Note (Signed)
 Referring Provider: Dr. Bobbetta Burnet Primary Care Physician:  Lanae Pinal, MD Primary Gastroenterologist:  Dr. Kimble Pennant  Reason for Consultation:  Anemia; Heme positive stool  HPI: Hannah Khan is a 68 y.o. female with multiple medical problems including NASH cirrhosis with gastric varices and S/P BRTO and failed TIPS. Denies melena or hematochezia. Reports heartburn this week when normally she does not have it. Admitted for SOB and cough. Reports hematemesis on a cruise in February and was airlifted to [place]  hospital but she refused endoscopy. She thinks her Hgb was around 7. Hgb 7.4 on 5/13 as outpt and 6.7 on admit yesterday. Colonoscopy in 8/23 showed diverticulosis and EGD showed Grade I esophageal varices, moderate portal hypertensive gastropathy and isolated gastric varices. Denies NSAIDs or alcohol .  Past Medical History:  Diagnosis Date   Anemia    hx of   Aortic stenosis 02/2023   Echo 03/07/23 LVEF 55-60%, mild to moderate AS   Arthritis    knees   CHF (congestive heart failure) (HCC)    chronic diastolic   Diabetes mellitus without complication (HCC)    type 2   Dyspnea    Dysrhythmia    Fibromyalgia    Gastric varices    large   H/O transfusion of platelets    Heart murmur    never has caused any problems   Hx of colonic polyps    s/p partial colectomy   Hyperlipidemia    Hypertension    Joint pain    in knees and back spasms   Left leg swelling    wear compression hose   Liver cirrhosis secondary to NASH (nonalcoholic steatohepatitis) (HCC) dx nov 2014   had enlarged spleen also    Morbid obesity (HCC)    Persistent atrial fibrillation (HCC)    Pneumonia 10/2016   x 5   PONV (postoperative nausea and vomiting)    in past none recent   Portal hypertension (HCC)    Sleep apnea    uses cpap, setting varies between 14-20   Spleen enlarged    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 [facility])     Past Surgical History:  Procedure Laterality Date   BREAST LUMPECTOMY  WITH RADIOACTIVE SEED LOCALIZATION Right 08/29/2017   Procedure: RIGHT BREAST LUMPECTOMY WITH RADIOACTIVE SEEDS LOCALIZATION;  Surgeon: Sim Dryer, MD;  Location: [facility] OR;  Service: General;  Laterality: Right;   CESAREAN SECTION  1989   CHOLECYSTECTOMY  20 yrs ago   COLECTOMY  2011   COLONOSCOPY     COLONOSCOPY N/A 09/05/2018   Procedure: COLONOSCOPY;  Surgeon: Evangeline Hilts, MD;  Location: [facility] END[facility];  Service: Endoscopy;  Laterality: N/A;   COLONOSCOPY WITH PROPOFOL  Bilateral 05/04/2022   Procedure: COLONOSCOPY WITH PROPOFOL ;  Surgeon: Evangeline Hilts, MD;  Location: [facility]OS[facility];  Service: Gastroenterology;  Laterality: Bilateral;   DILATATION & CURETTAGE/HYSTEROSCOPY WITH MYOSURE N/A 05/06/2016   Procedure: DILATATION & CURETTAGE/HYSTEROSCOPY WITH MYOSURE WITH POLYPECTOMY;  Surgeon: Keene Pastures, DO;  Location: [facility] ORS;  Service: Gynecology;  Laterality: N/A;   ESOPHAGOGASTRODUODENOSCOPY (EGD) WITH PROPOFOL  N/A 09/04/2013   Procedure: ESOPHAGOGASTRODUODENOSCOPY (EGD) WITH PROPOFOL ;  Surgeon: Evangeline Hilts, MD;  [facility];  Service: Endoscopy;  Laterality: N/A;   ESOPHAGOGASTRODUODENOSCOPY (EGD) WITH PROPOFOL  Bilateral 05/04/2022   Procedure: ESOPHAGOGASTRODUODENOSCOPY (EGD) WITH PROPOFOL ;  Surgeon: Evangeline Hilts, MD;  [facility]: [facility];  Service: Gastroenterology;  Laterality: Bilateral;   IR EMBO ART  VEN HEMORR LYMPH EXTRAV  INC GUIDE ROADMAPPING  06/30/2023   IR INTRAVASCULAR ULTRASOUND NON CORONARY  12/23/2022  IR RADIOLOGIST EVAL & MGMT  09/21/2022   IR RADIOLOGIST EVAL & MGMT  10/06/2022   IR RADIOLOGIST EVAL & MGMT  11/02/2022   IR RADIOLOGIST EVAL & MGMT  01/31/2023   IR RADIOLOGIST EVAL & MGMT  05/31/2023   IR RADIOLOGIST EVAL & MGMT  08/21/2023   IR RADIOLOGIST EVAL & MGMT  12/25/2023   IR TIPS  12/23/2022   IR TIPS  06/30/2023   IR US  GUIDE VASC ACCESS LEFT  06/30/2023   IR US  GUIDE VASC ACCESS RIGHT  12/23/2022   IR US  GUIDE VASC ACCESS RIGHT  06/30/2023   IR  US  GUIDE VASC ACCESS RIGHT  06/30/2023   KNEE ARTHROSCOPY  yrs ago   bilateral, one done x 1, one done twice   POLYPECTOMY  09/05/2018   Procedure: POLYPECTOMY;  Surgeon: Evangeline Hilts, MD;  [facility]: [facility];  Service: Endoscopy;;   RADIOLOGY WITH ANESTHESIA N/A 12/23/2022   Procedure: TIPS;  Surgeon: Federico Hopkins, MD;  Location: [facility] OR;  Service: Radiology;  Laterality: N/A;   RIGHT HEART CATH N/A 05/19/2023   Procedure: RIGHT HEART CATH;  Surgeon: Alwin Baars, DO;  Location: [facility] INVAS[facility] LAB;  Service: Cardiovascular;  Laterality: N/A;   TIPS PROCEDURE N/A 06/30/2023   Procedure: TRANS-JUGULAR INTRAHEPATIC PORTAL SHUNT (TIPS);  Surgeon: Federico Hopkins, MD;  Location: [facility] OR;  Service: Radiology;  Laterality: N/A;    Prior to Admission medications   Medication Sig Start Date End Date Taking? Authorizing Provider  albuterol  (VENTOLIN  HFA) 108 (90 Base) MCG/ACT inhaler Inhale 1-2 puffs into the lungs every 6 (six) hours as needed for wheezing or shortness of breath.   Yes [provider]  benzonatate  (TESSALON ) 100 MG capsule Take 100 mg by mouth 3 (three) times daily as needed for cough. 01/30/24  Yes [provider]  Continuous Glucose Sensor (FREESTYLE LIBRE 3 PLUS SENSOR) MISC Apply 1 each topically as directed. Place on arm as directed and change every 15 days. 01/12/24  Yes [provider]  digoxin  (LANOXIN ) 0.125 MG tablet Take 1 tablet (125 mcg total) by mouth daily. 08/02/23  Yes Sabharwal, Aditya, DO  lactulose  (CHRONULAC ) 10 GM/15ML solution Take 20 g by mouth 3 (three) times daily. 03/17/21  Yes [provider]  Lancets (ONETOUCH DELICA PLUS LANCET33G) MISC Apply topically 3 (three) times daily. 04/10/20  Yes [provider]  metolazone  (ZAROXOLYN ) 2.5 MG tablet TAKE 1 TABLET (2.5 MG TOTAL) BY MOUTH AS NEEDED (AS DIRECTED BY [facility] CLINIC). 10/19/23  Yes Sabharwal, Aditya, DO  metoprolol  tartrate (LOPRESSOR ) 25 MG tablet Take 1  tablet (25 mg total) by mouth 2 (two) times daily. 07/11/23  Yes Elgergawy, Ardia Kraft, MD  midodrine  (PROAMATINE ) 10 MG tablet Take 1 tablet (10 mg total) by mouth 3 (three) times daily. 04/18/23  Yes Clearnce Curia, NP  Multiple Vitamins-Minerals (PRESERVISION AREDS 2) CAPS Take 1 capsule by mouth daily.   Yes [provider]  [facility] VERIO test strip 1 each 3 (three) times daily. 04/13/20  Yes [provider]  pantoprazole  (PROTONIX ) 40 MG tablet Take 40 mg by mouth 2 (two) times daily. 11/17/23  Yes [provider]  potassium chloride  (MICRO-K ) 10 MEQ CR capsule Take 10 capsules (100 mEq total) by mouth 2 (two) times daily. Take an extra 4 tablets when taking Metolazone . 06/15/23  Yes Arleene Belt, PA-C  Propylene Glycol (SYSTANE BALANCE) 0.6 % SOLN Place 1 drop into both eyes 2 (two) times daily.   Yes  [provider]  spironolactone  (ALDACTONE ) 25 MG tablet Take 3 tablets (75 mg total) by mouth daily. Patient taking differently: Take 25 mg by mouth daily. Take with 50 mg tablet for a total dose of 75 mg daily. 12/06/23  Yes Sabharwal, Aditya, DO  spironolactone  (ALDACTONE ) 50 MG tablet Take 50 mg by mouth daily. Take with 25 mg tablet for a total dose of 75 mg daily. 01/18/24  Yes [provider]  torsemide  (DEMADEX ) 20 MG tablet Take 5 tablets (100 mg total) by mouth every morning for 5 days, THEN 2 tablets (40 mg total) every evening for 5 days, THEN 5 tablets (100 mg total) daily. Patient taking differently: Take 4 tablets (80 mg total) by mouth in the morning and 2 tablets (40 mg total) in the evening 08/02/23 08/11/24 Yes Sabharwal, Aditya, DO  TRESIBA  FLEXTOUCH 100 UNIT/ML FlexTouch Pen Inject 24 Units into the skin daily. (TITRATE WHEN DIRECTED UP TO 50 UNITS) 01/22/24  Yes [provider]  XIFAXAN  550 MG TABS tablet Take 550 mg by mouth 2 (two) times daily. 03/13/21  Yes [provider]    Scheduled Meds:  azithromycin    500 mg Oral Daily   dextromethorphan -guaiFENesin   1 tablet Oral BID   digoxin   125 mcg Oral Daily   insulin  aspart  0-5 Units Subcutaneous QHS   insulin  aspart  0-9 Units Subcutaneous TID WC   insulin  glargine-yfgn  20 Units Subcutaneous Daily   lactulose   20 g Oral TID   lidocaine   1 patch Transdermal Once   lidocaine   3 patch Transdermal Q24H   metoprolol  tartrate  25 mg Oral BID   midodrine   10 mg Oral TID   pantoprazole   40 mg Oral BID   rifaximin   550 mg Oral BID   spironolactone   75 mg Oral Daily   Continuous Infusions:  cefTRIAXone  (ROCEPHIN )  IV 2 g (02/03/24 0908)   PRN Meds:.benzonatate , levalbuterol , ondansetron  **OR** ondansetron  (ZOFRAN ) IV, mouth rinse, oxyCODONE , polyvinyl alcohol   Allergies as of 02/02/2024 - Review Complete 02/02/2024  Allergen Reaction Noted   Celebrex [celecoxib] Other (See Comments) 12/08/2015   Shellfish allergy Swelling, Rash, and Other (See Comments) 08/02/2014   Barium-containing compounds Other (See Comments) 06/06/2017   Prednisone Other (See Comments) 12/08/2015   Statins Other (See Comments) 02/03/2011   Fentanyl  Other (See Comments) 04/26/2018   Ibuprofen  Other (See Comments) 05/02/2016   Sglt2 inhibitors Other (See Comments) 03/07/2023   Tramadol  Other (See Comments) 09/07/2018   Tylenol  [acetaminophen ] Other (See Comments) 05/02/2016   Zetia [ezetimibe] Other (See Comments) 03/29/2021    Family History  Problem Relation Age of Onset   Heart failure Mother    Cancer Mother    Hyperlipidemia Mother    Congestive Heart Failure Mother    Stroke Mother    Lung cancer Father    Cancer Father    Cancer Brother    Hyperlipidemia Brother    Stroke Other    Breast cancer Neg Hx     Social History   Socioeconomic History   Marital status: Married    Spouse name: Not on file   Number of children: Not on file   Years of education: Not on file   Highest education level: Not on file  Occupational History   Occupation: Automotive engineer  Tobacco Use   Smoking status: Former    Current packs/day: 0.00    Average packs/day: 1 pack/day for 3.0 years (3.0 ttl pk-yrs)    Types: Cigarettes  Start date: 09/20/1971    Quit date: 09/19/1974    Years since quitting: 49.4   Smokeless tobacco: Never  Vaping Use   Vaping status: Never Used  Substance and Sexual Activity   Alcohol  use: Not Currently    Comment: occ   Drug use: No   Sexual activity: Not on file  Other Topics Concern   Not on file  Social History Narrative   Lives in [place] with her husband.  Instructor for early childhood development.     Social Drivers of Corporate investment banker Strain: Not on file  Food Insecurity: No Food Insecurity (02/02/2024)   Hunger Vital Sign    Worried About Running Out of Food in the Last Year: Never true    Ran Out of Food in the Last Year: Never true  Transportation Needs: No Transportation Needs (02/02/2024)   PRAPARE - Administrator, Civil Service (Medical): No    Lack of Transportation (Non-Medical): No  Physical Activity: Not on file  Stress: Not on file  Social Connections: Socially Integrated (02/02/2024)   Social Connection and Isolation Panel [NHANES]    Frequency of Communication with Friends and Family: More than three times a week    Frequency of Social Gatherings with Friends and Family: More than three times a week    Attends Religious Services: 1 to 4 times per year    Active Member of [employer] or Organizations: No    Attends Banker Meetings: 1 to 4 times per year    Marital Status: Married  Catering manager Violence: Not At Risk (02/02/2024)   Humiliation, Afraid, Rape, and Kick questionnaire    Fear of Current or Ex-Partner: No    Emotionally Abused: No    Physically Abused: No    Sexually Abused: No    Review of Systems: All negative except as stated above in HPI.  Physical Exam: Vital signs: Vitals:   02/03/24 0722 02/03/24 0856  BP: (!) 153/74 125/68   Pulse: (!) 107 (!) 118  Resp: 16   Temp: 97.8 F (36.6 C)   SpO2: 96%    Last BM Date : 02/02/24 General:   lethargic, elderly, morbidly obese, no acute distress  Head: normocephalic, atraumatic Eyes: anicteric sclera ENT: oropharynx clear Neck: supple, nontender Lungs:  Clear throughout to auscultation.   No wheezes, crackles, or rhonchi. No acute distress. Heart:  Regular rate and rhythm; no murmurs, clicks, rubs,  or gallops. Abdomen: soft, nontender, nondistended, +BS, obese  Rectal:  Deferred Ext: LE - chronic vascular changes with edema  GI:  Lab Results: Recent Labs    02/02/24 0900 02/03/24 0812  WBC 2.9* 3.0*  HGB 6.7* 7.3*  HCT 21.0* 22.8*  PLT 25* 30*   BMET Recent Labs    02/02/24 0900 02/03/24 0812  NA 132* 132*  K 5.0 4.2  CL 102 100  CO2 19* 20*  GLUCOSE 182* 206*  BUN 37* 34*  CREATININE 2.05* 2.11*  CALCIUM  9.2 9.1   LFT Recent Labs    02/02/24 0900  PROT 6.9  ALBUMIN  3.6  AST 29  ALT 17  ALKPHOS 69  BILITOT 2.4*   PT/INR No results for input(s): "LABPROT", "INR" in the last 72 hours.   Studies/Results:  Impression/Plan: Anemia and heme positive stool without overt bleeding in the setting of NASH cirrhosis with known esophageal and gastric varices. I do not think she is having a GI bleed and would not recommend an EGD  at this time. If anemia worsens then will consider EGD. Supportive care. Will follow.    LOS: 0 days   Yvetta Herbert  02/03/2024, 10:42 AM  Questions please call [phone]

## 2024-02-03 NOTE — Care Management Obs Status (Signed)
 MEDICARE OBSERVATION STATUS NOTIFICATION   Patient Details  Name: CLEATUS GOODIN MRN: [id number] Date of Birth: 07-13-1956   Medicare Observation Status Notification Given:  Yes    Cindia Crease, RN 02/03/2024, 5:01 PM

## 2024-02-03 NOTE — Progress Notes (Signed)
 PROGRESS NOTE Hannah Khan  UXL:[id number] DOB: May 01, 1956 DOA: 02/02/2024 PCP: Lanae Pinal, MD  Brief Narrative/Hospital Course: 68 y/o F  with h/o NASH Liver cirrhosis with gastro-esophageal varices, A fib , pancytopenia, CKD 3B b/l bun/creat 21/1.6,severe morbid obesity, A-fib not on anticoagulation, diabetes, hypertension presented to ED 02/02/24 with shortness of breath and upper respiratory symptoms x 1 week, had sick contact with husband.  Patient was seen by PCP, there was "mild congestion" on the chest x-ray.She is short of breath and having cough w/ greenish sputum , no fever, no abdomen pain, no chest pain. She had run out of neb so was using her husband's at home. No focal weakness no nausea vomiting or and has some  loose stool from from lactulose  In the ED: Hemodynamically stable although mild tachycardic, saturating initially room air subsequently needed 2 L [place], afebrile. Labs-creatinine elevated to 2.0 from baseline 1.6, normal LFTs, proBNP 1655 troponin 30> 27, WBC 2.9 hemoglobin 6.7 platelet 25K-baseline value on 07/11/2023 hemoglobin 9 neurology 3.3 platelet 36 but as low as 19K before. Patient noticed to have chronic leg swelling  which is better than before. She has having wheezing right side crepitus  EKG A-fib with RVR CXR pleural effusion with underlying consolidation -atelectasis vs infiltrate. viral panel -ve.  Patient was started on azitho/rocephin /IV lasix /nebs in ED and further admission requested for CHF possible pneumonia and anemia. Guaic not done in ED. No melena/hematochezia  Subjective: Seen and examined  C/o diffuse pain, did not sleep well overnight afebrile BP stable used CPAP last night Labs reviewed hemoglobin improved to 7.3 creatinine 2.1 from 2.05 blood sugar 206 Pro-Cal 0.2.  FOBT was positive  On RA now  Assessment and plan:   Acute on chronic anemia, symptomatic Hemoccult positive: Anemia with pancytopenia from liver cirrhosis, with Hemoccult  positive hemoglobin improved with 1 unit PRBC.Had 2 units in fall and 1 unit in feb. Anemia panel stable B12 folate iron Continue PPI bid, [facility]  [facility] consulted given FOBT positive concern for varices but no hematemesis.  CHFpEF chronic: Last echo with EF 55 to 60% no RWMA RV function normal PTA on metolazone  2.5 as needed, midodrine , metoprolol  torsemide  80 mg in am 40mg  in pm, aldactone  323 dry wt at baseline per her now at 297lb on admit w/ chronic leg edema but not worse.  Cxred, pleural effusion question underlying consolidation versus atelectasis. Will resume home torsemide -at lower dose due to elevated creatinine Cont to monitor daily I/O,weight, electrolytes and net balance as below.Keep on  salt/fluid restricted diet and monitor in tele. Net IO Since Admission: 261.97 mL [02/03/24 1052]  Filed Weights   02/02/24 0423 02/02/24 1539 02/03/24 0423  Weight: 136.1 kg 134.9 kg 134.4 kg    Recent Labs  Lab 02/02/24 0900 02/02/24 1018 02/03/24 0812  PROBNP 1,655.0*  --   --   BUN 37*  --  34*  CREATININE 2.05*  --  2.11*  K 5.0  --  4.2  MG  --  1.9  --     NASH cirrhosis Pancytopenia History of gastroesophageal varices: Currently no episode of hematemesis.  But FOBT positive continue PPI.  [facility] has been consulted.  She had hematemesis while in [place] in [place] felt to be gastroenteritis and was airflown to [facility].  There was plan for TIPS- sees Dr Jinx Mourning and was aborted in first one due ot fast HR and had 2nd procdure done which was only half. Has appointment at [facility] on June 4  w/  Liver Transplant team.   Acute hypoxic respiratory failure: In the setting of pneumonia, resolved   Suspected bacterial CAP: Given her liver cirrhosis, immunocompromise state, continue empiric antibiotics follow-up sputum culture, Pro-Cal slightly up  Recent Labs  Lab 02/02/24 0900 02/02/24 1807 02/03/24 0812  WBC 2.9*  --  3.0*  LATICACIDVEN 1.9  --   --   PROCALCITON  --  0.23  --       AKI on CKD3B: Creat up from baseline of 1.6, monitor-avoid hypotension Recent Labs    07/09/23 0312 07/10/23 0243 07/11/23 0335 08/09/23 1024 08/31/23 1112 09/11/23 1043 11/24/23 0817 12/06/23 1532 12/13/23 1216 02/02/24 0900 02/03/24 0812  BUN 26* 25* 22 19 19 21   --  21 21 37* 34*  CREATININE 1.30* 1.36* 1.26* 1.49* 1.61* 1.63* 1.60* 1.68* 1.64* 2.05* 2.11*  CO2 26 27 26 24 24 28   --  23 22 19* 20*  K 3.8 3.9 3.7 4.4 3.8 3.8  --  4.1 4.7 5.0 4.2     Chronic back pain: Continue pain control.  Lidocaine  patch, added oxycodone  as needed for severe pain   A-fib with RVR: Heart rate mildly elevated.  Overall stable, continue digoxin , metoprolol , midodrine .  Not on anticoagulation PTA.   Hypertension: BP stable   Diabetes mellitus on long-term insulin  PTA on Tresiba  25 units daily, resumed along with SSI Recent Labs  Lab 02/02/24 1725 02/02/24 1807 02/02/24 2100 02/03/24 0604  GLUCAP 147*  --  179* 166*  HGBA1C  --  6.0*  --   --     OSA: Resume home cpap tonight   Pancytopenia/thrombocytopenia: Appears to be chronic issue.  Monitor closely  Morbid obesity with BMI 46: Will benefit with PCP follow-up weight loss healthy lifestyle.  Goals of care: Currently full code, she has complex medical comorbidities, she is high risk for readmission.  Prognosis depends to be seen  DVT prophylaxis: SCDs Start: 02/02/24 1653 Code Status:   Code Status: Full Code Family Communication: plan of care discussed with patient at bedside. Patient status is: Remains hospitalized because of severity of illness Level of care: Telemetry Cardiac   Dispo: The patient is from: home            Anticipated disposition: TBD Objective: Vitals last 24 hrs: Vitals:   02/03/24 0200 02/03/24 0423 02/03/24 0722 02/03/24 0856  BP: 115/62 123/63 (!) 153/74 125/68  Pulse: 93 100 (!) 107 (!) 118  Resp: 13 20 16    Temp:  98 F (36.7 C) 97.8 F (36.6 C)   TempSrc:  Oral Oral   SpO2: 99%  98% 96%   Weight:  134.4 kg    Height:        Physical Examination: General exam: alert awake, older than stated age HEENT:Oral mucosa moist, Ear/Nose WNL grossly Respiratory system: Bilaterally diminished BS, no use of accessory muscle Cardiovascular system: S1 & S2 +. Gastrointestinal system: Abdomen soft, obese, bruises+ NT,ND,BS+ Nervous System: Alert, awake, oriented following commands. Extremities: LE edema + chronic  warm extremities Skin: No rashes,warm. MSK: Normal muscle bulk/tone.   Data Reviewed: I have personally reviewed following labs and imaging studies ( see epic result tab) CBC: Recent Labs  Lab 02/02/24 0900 02/03/24 0812  WBC 2.9* 3.0*  NEUTROABS 2.2  --   HGB 6.7* 7.3*  HCT 21.0* 22.8*  MCV 91.3 90.8  PLT 25* 30*   CMP: Recent Labs  Lab 02/02/24 0900 02/02/24 1018 02/03/24 0812  NA 132*  --  132*  K 5.0  --  4.2  CL 102  --  100  CO2 19*  --  20*  GLUCOSE 182*  --  206*  BUN 37*  --  34*  CREATININE 2.05*  --  2.11*  CALCIUM  9.2  --  9.1  MG  --  1.9  --    GFR: Estimated Creatinine Clearance: 37 mL/min (A) (by C-G formula based on SCr of 2.11 mg/dL (H)). Recent Labs  Lab 02/02/24 0900  AST 29  ALT 17  ALKPHOS 69  BILITOT 2.4*  PROT 6.9  ALBUMIN  3.6   No results for input(s): "LIPASE", "AMYLASE" in the last 168 hours. No results for input(s): "AMMONIA" in the last 168 hours. Coagulation Profile: No results for input(s): "INR", "PROTIME" in the last 168 hours. Unresulted Labs (From admission, onward)     Start     Ordered   02/03/24 0500  Basic metabolic panel with GFR  Daily,   R     Question:  Specimen collection method  Answer:  Lab=Lab collect   02/02/24 1659   02/03/24 0500  CBC  Daily,   R     Question:  Specimen collection method  Answer:  Lab=Lab collect   02/02/24 1659   02/02/24 1653  Expectorated Sputum Assessment w Gram Stain, Rflx to Resp Cult  (COPD / Pneumonia / Cellulitis / Lower Extremity Wound (Diabetic Foot  Infection))  Once,   R        02/02/24 1653   02/02/24 1653  Legionella Pneumophila Serogp 1 Ur Ag  (COPD / Pneumonia / Cellulitis / Lower Extremity Wound (Diabetic Foot Infection))  Once,   R        02/02/24 1653           Antimicrobials/Microbiology: Anti-infectives (From admission, onward)    Start     Dose/Rate Route Frequency Ordered Stop   02/03/24 1000  cefTRIAXone  (ROCEPHIN ) 2 g in sodium chloride  0.9 % 100 mL IVPB        2 g 200 mL/hr over 30 Minutes Intravenous Every 24 hours 02/02/24 1653 02/08/24 0959   02/03/24 1000  azithromycin  (ZITHROMAX ) 500 mg in sodium chloride  0.9 % 250 mL IVPB  Status:  Discontinued        500 mg 250 mL/hr over 60 Minutes Intravenous Every 24 hours 02/02/24 1653 02/03/24 0913   02/03/24 1000  azithromycin  (ZITHROMAX ) tablet 500 mg        500 mg Oral Daily 02/03/24 0913 02/08/24 0959   02/02/24 2200  rifaximin  (XIFAXAN ) tablet 550 mg        550 mg Oral 2 times daily 02/02/24 1653     02/02/24 1000  cefTRIAXone  (ROCEPHIN ) 1 g in sodium chloride  0.9 % 100 mL IVPB        1 g 200 mL/hr over 30 Minutes Intravenous  Once 02/02/24 0958 02/02/24 1114   02/02/24 1000  azithromycin  (ZITHROMAX ) 500 mg in sodium chloride  0.9 % 250 mL IVPB        500 mg 250 mL/hr over 60 Minutes Intravenous  Once 02/02/24 0958 02/02/24 1307         Component Value Date/Time   SDES  02/02/2024 1015    BLOOD RIGHT ANTECUBITAL Performed at [facility], [address] [place], [place] [address]    [facility]  02/02/2024 1015    BOTTLES DRAWN AEROBIC AND ANAEROBIC Blood Culture results may not be optimal due to an inadequate volume of blood  received in culture bottles Performed at [facility], [address] [place], [place] [address]    CULT  02/02/2024 1015    NO GROWTH < 24 HOURS Performed at [facility], [address] [place], [place] [address]    REPTSTATUS PENDING 02/02/2024 1015    Medications reviewed:  Scheduled Meds:   azithromycin   500 mg Oral Daily   dextromethorphan -guaiFENesin   1 tablet Oral BID   digoxin   125 mcg Oral Daily   insulin  aspart  0-5 Units Subcutaneous QHS   insulin  aspart  0-9 Units Subcutaneous TID WC   insulin  glargine-yfgn  20 Units Subcutaneous Daily   lactulose   20 g Oral TID   lidocaine   1 patch Transdermal Once   lidocaine   3 patch Transdermal Q24H   metoprolol  tartrate  25 mg Oral BID   midodrine   10 mg Oral TID   pantoprazole   40 mg Oral BID   rifaximin   550 mg Oral BID   spironolactone   75 mg Oral Daily   torsemide   40 mg Oral BID   Continuous Infusions:  cefTRIAXone  (ROCEPHIN )  IV 2 g (02/03/24 0908)    Lesa Rape, MD [facility] 02/03/2024, 10:56 AM

## 2024-02-04 DIAGNOSIS — I5032 Chronic diastolic (congestive) heart failure: Secondary | ICD-10-CM | POA: Diagnosis not present

## 2024-02-04 LAB — BASIC METABOLIC PANEL WITH GFR
Anion gap: 13 (ref 5–15)
BUN: 32 mg/dL — ABNORMAL HIGH (ref 8–23)
CO2: 19 mmol/L — ABNORMAL LOW (ref 22–32)
Calcium: 8.6 mg/dL — ABNORMAL LOW (ref 8.9–10.3)
Chloride: 102 mmol/L (ref 98–111)
Creatinine, Ser: 1.98 mg/dL — ABNORMAL HIGH (ref 0.44–1.00)
GFR, Estimated: 27 mL/min — ABNORMAL LOW (ref >60)
Glucose, Bld: 139 mg/dL — ABNORMAL HIGH (ref 70–99)
Potassium: 3.4 mmol/L — ABNORMAL LOW (ref 3.5–5.1)
Sodium: 134 mmol/L — ABNORMAL LOW (ref 135–145)

## 2024-02-04 LAB — CBC
HCT: 23.9 % — ABNORMAL LOW (ref 36.0–46.0)
Hemoglobin: 7.3 g/dL — ABNORMAL LOW (ref 12.0–15.0)
MCH: 27.9 pg (ref 26.0–34.0)
MCHC: 30.5 g/dL (ref 30.0–36.0)
MCV: 91.2 fL (ref 80.0–100.0)
Platelets: 33 10*3/uL — ABNORMAL LOW (ref 150–400)
RBC: 2.62 MIL/uL — ABNORMAL LOW (ref 3.87–5.11)
RDW: 15.5 % (ref 11.5–15.5)
WBC: 4 10*3/uL (ref 4.0–10.5)
nRBC: 0 % (ref 0.0–0.2)

## 2024-02-04 LAB — GLUCOSE, CAPILLARY
Glucose-Capillary: 132 mg/dL — ABNORMAL HIGH (ref 70–99)
Glucose-Capillary: 144 mg/dL — ABNORMAL HIGH (ref 70–99)
Glucose-Capillary: 179 mg/dL — ABNORMAL HIGH (ref 70–99)
Glucose-Capillary: 117 mg/dL — ABNORMAL HIGH (ref 70–99)

## 2024-02-04 LAB — DIGOXIN LEVEL: Digoxin Level: 0.9 ng/mL (ref 0.8–2.0)

## 2024-02-04 LAB — LEGIONELLA PNEUMOPHILA SEROGP 1 UR AG: L. pneumophila Serogp 1 Ur Ag: NEGATIVE

## 2024-02-04 MED ORDER — POTASSIUM CHLORIDE CRYS ER 20 MEQ PO TBCR
40.0000 meq | EXTENDED_RELEASE_TABLET | Freq: Every day | ORAL | Status: AC
  Administered 2024-02-04 – 2024-02-05 (×2): 40 meq via ORAL
  Filled 2024-02-04 (×2): qty 2

## 2024-02-04 MED ORDER — POTASSIUM CHLORIDE CRYS ER 20 MEQ PO TBCR
40.0000 meq | EXTENDED_RELEASE_TABLET | Freq: Once | ORAL | Status: AC
  Administered 2024-02-04: 40 meq via ORAL
  Filled 2024-02-04: qty 2

## 2024-02-04 NOTE — Progress Notes (Signed)
 PROGRESS NOTE Hannah Khan  WUJ:[id number] DOB: Dec 27, 1955 DOA: 02/02/2024 PCP: Lanae Pinal, MD  Brief Narrative/Hospital Course: 68 y/o F  with h/o NASH Liver cirrhosis with gastro-esophageal varices, A fib , pancytopenia, CKD 3B b/l bun/creat 21/1.6,severe morbid obesity, A-fib not on anticoagulation, diabetes, hypertension presented to ED 02/02/24 with shortness of breath and upper respiratory symptoms x 1 week, had sick contact with husband.  Patient was seen by PCP, there was "mild congestion" on the chest x-ray.She is short of breath and having cough w/ greenish sputum , no fever, no abdomen pain, no chest pain. She had run out of neb so was using her husband's at home. No focal weakness no nausea vomiting or and has some  loose stool from from lactulose  In the ED: Hemodynamically stable although mild tachycardic, saturating initially room air subsequently needed 2 L [place], afebrile. Labs-creatinine elevated to 2.0 from baseline 1.6, normal LFTs, proBNP 1655 troponin 30> 27, WBC 2.9 hemoglobin 6.7 platelet 25K-baseline value on 07/11/2023 hemoglobin 9 neurology 3.3 platelet 36 but as low as 19K before. Patient noticed to have chronic leg swelling  which is better than before. She has having wheezing right side crepitus  EKG A-fib with RVR CXR pleural effusion with underlying consolidation -atelectasis vs infiltrate. viral panel -ve.  Patient was started on azitho/rocephin /IV lasix /nebs in ED and further admission requested for CHF possible pneumonia and anemia. Guaic not done in ED. No melena/hematochezia  Subjective: Seen examined She reports she is feeling better overall. Complains of bloody stool but did not check if it was black or brown Overnight BP stable afebrile, on RA Labs normal creatinine slightly better 1.9, K 3.4, HB pending as it clotted  this morning  Assessment and plan:   Acute on chronic anemia, symptomatic Hemoccult positive: pancytopenia from liver cirrhosis, with  Hemoccult positive  stool-without overt bleeding.   S/p 1 u PRBC.Had 2 units in fall and 1 unit in feb.Anemia panel stable B12 folate iron Continue PPI twice daily. Per GI on board would not recommend EGD at this time but if anemia worsens consider EGD Recent Labs  Lab 02/02/24 0900 02/03/24 0812  HGB 6.7* 7.3*  HCT 21.0* 22.8*    CHFpEF chronic: Last echo with EF 55 to 60% no RWMA RV function normal PTA on metolazone  2.5 as needed, midodrine , metoprolol  torsemide  80 mg in am 40mg  in pm, aldactone  323 dry wt at baseline per her now at 297lb on admit > down to 291lb. Has chronic leg edema but not worse.  Cxrray- pleural effusion question underlying consolidation versus atelectasis. Continue torsemide  40 twice daily, Aldactone  Cont to monitor daily I/O,weight, electrolytes and net balance as below.Keep on  salt/fluid restricted diet and monitor in tele. Net IO Since Admission: -998.03 mL [02/04/24 1158]  Filed Weights   02/02/24 1539 02/03/24 0423 02/04/24 0417  Weight: 134.9 kg 134.4 kg 132.4 kg    Recent Labs  Lab 02/02/24 0900 02/02/24 1018 02/03/24 0812 02/04/24 0227  PROBNP 1,655.0*  --   --   --   BUN 37*  --  34* 32*  CREATININE 2.05*  --  2.11* 1.98*  K 5.0  --  4.2 3.4*  MG  --  1.9  --   --     NASH cirrhosis Pancytopenia History of gastroesophageal varices: Currently no episode of hematemesis.  But FOBT positive continue PPI.  [facility] has been consulted.  She had hematemesis while in [place] in Feb felt to be gastroenteritis and was airflown  to [facility].  There was plan for TIPS- sees Dr Jinx Mourning and was aborted in first one due ot fast HR and had 2nd procdure done which was only half. Has appointment at [facility] on June 4 w/  Liver Transplant team. GI following currently   Acute hypoxic respiratory failure: In the setting of pneumonia, resolved. On RA.   Suspected bacterial CAP: Given her liver cirrhosis, immunocompromise state, continue empiric antibiotics  ceftriaxone , azithromycin , Pro-Cal 0.3  AKI on CKD3B Mild hypokalemia Metabolic acidosis due to AKI/CKD: Creat up from baseline of 1.6, creatinine slowly improving, continue torsemide  as ordered avoid hypotension and NSAIDs . Replace potassium, monitor bicarb Recent Labs    07/10/23 0243 07/11/23 0335 08/09/23 1024 08/31/23 1112 09/11/23 1043 11/24/23 0817 12/06/23 1532 12/13/23 1216 02/02/24 0900 02/03/24 0812 02/04/24 0227  BUN 25* 22 19 19 21   --  21 21 37* 34* 32*  CREATININE 1.36* 1.26* 1.49* 1.61* 1.63* 1.60* 1.68* 1.64* 2.05* 2.11* 1.98*  CO2 27 26 24 24 28   --  23 22 19* 20* 19*  K 3.9 3.7 4.4 3.8 3.8  --  4.1 4.7 5.0 4.2 3.4*     Chronic back pain: Continue pain control.  Lidocaine  patch, added oxycodone  as needed for severe pain   A-fib with RVR: Status stable.  Continue  her digoxin , metoprolol , midodrine .  Not on anticoagulation PTA.   Hypertension: BP stable   Diabetes mellitus on long-term insulin  PTA on Tresiba  25 units daily, resumed along with SSI Recent Labs  Lab 02/02/24 1807 02/02/24 2100 02/03/24 1125 02/03/24 1603 02/03/24 2101 02/04/24 0555 02/04/24 1117  GLUCAP  --    < > 158* 164* 159* 132* 144*  HGBA1C 6.0*  --   --   --   --   --   --    < > = values in this interval not displayed.    OSA: Continue home cpap tonight   Pancytopenia/thrombocytopenia: Appears to be chronic issue.  Monitor closely  Morbid obesity with BMI 46: Will benefit with PCP follow-up weight loss healthy lifestyle.  Goals of care: Currently full code, she has complex medical comorbidities, she is high risk for readmission.  Prognosis depends to be seen  DVT prophylaxis: SCDs Start: 02/02/24 1653 Code Status:   Code Status: Full Code Family Communication: plan of care discussed with patient at bedside. Patient status is: Remains hospitalized because of severity of illness Level of care: Telemetry Cardiac   Dispo: The patient is from: home             Anticipated disposition: TBD Objective: Vitals last 24 hrs: Vitals:   02/04/24 0108 02/04/24 0417 02/04/24 0727 02/04/24 1103  BP:  120/67 (!) 117/49 138/68  Pulse: (!) 102 100 100 95  Resp: 17 20 12 17   Temp:  98.5 F (36.9 C) 98.7 F (37.1 C) 98 F (36.7 C)  TempSrc:  Oral Oral Oral  SpO2: 100% 94% 97% 97%  Weight:  132.4 kg    Height:        Physical Examination: General exam: alert awake oriented, obese HEENT:Oral mucosa moist, Ear/Nose WNL grossly Respiratory system: Bilaterally clear BS,no use of accessory muscle Cardiovascular system: S1 & S2 +, No JVD. Gastrointestinal system: Abdomen soft, obese bruise+, BS+ Nervous System: Alert, awake, moving all extremities,and following commands. Extremities: LE edema + chronic,distal peripheral pulses palpable and warm.  Skin: No rashes,no icterus. MSK: Normal muscle bulk,tone, power   Data Reviewed: I have personally reviewed following labs and  imaging studies ( see epic result tab) CBC: Recent Labs  Lab 02/02/24 0900 02/03/24 0812  WBC 2.9* 3.0*  NEUTROABS 2.2  --   HGB 6.7* 7.3*  HCT 21.0* 22.8*  MCV 91.3 90.8  PLT 25* 30*   CMP: Recent Labs  Lab 02/02/24 0900 02/02/24 1018 02/03/24 0812 02/04/24 0227  NA 132*  --  132* 134*  K 5.0  --  4.2 3.4*  CL 102  --  100 102  CO2 19*  --  20* 19*  GLUCOSE 182*  --  206* 139*  BUN 37*  --  34* 32*  CREATININE 2.05*  --  2.11* 1.98*  CALCIUM  9.2  --  9.1 8.6*  MG  --  1.9  --   --    GFR: Estimated Creatinine Clearance: 39.1 mL/min (A) (by C-G formula based on SCr of 1.98 mg/dL (H)). Recent Labs  Lab 02/02/24 0900  AST 29  ALT 17  ALKPHOS 69  BILITOT 2.4*  PROT 6.9  ALBUMIN  3.6   No results for input(s): "LIPASE", "AMYLASE" in the last 168 hours. No results for input(s): "AMMONIA" in the last 168 hours. Coagulation Profile: No results for input(s): "INR", "PROTIME" in the last 168 hours. Unresulted Labs (From admission, onward)     Start     Ordered    02/04/24 1033  CBC  Once,   R       Question:  Specimen collection method  Answer:  Lab=Lab collect   02/04/24 1032   02/04/24 0817  Digoxin  level  Once-Timed,   TIMED       Question:  Specimen collection method  Answer:  Lab=Lab collect   02/04/24 0816   02/03/24 0500  Basic metabolic panel with GFR  Daily,   R     Question:  Specimen collection method  Answer:  Lab=Lab collect   02/02/24 1659   02/03/24 0500  CBC  Daily,   R     Question:  Specimen collection method  Answer:  Lab=Lab collect   02/02/24 1659           Antimicrobials/Microbiology: Anti-infectives (From admission, onward)    Start     Dose/Rate Route Frequency Ordered Stop   02/03/24 1000  cefTRIAXone  (ROCEPHIN ) 2 g in sodium chloride  0.9 % 100 mL IVPB        2 g 200 mL/hr over 30 Minutes Intravenous Every 24 hours 02/02/24 1653 02/08/24 0959   02/03/24 1000  azithromycin  (ZITHROMAX ) 500 mg in sodium chloride  0.9 % 250 mL IVPB  Status:  Discontinued        500 mg 250 mL/hr over 60 Minutes Intravenous Every 24 hours 02/02/24 1653 02/03/24 0913   02/03/24 1000  azithromycin  (ZITHROMAX ) tablet 500 mg        500 mg Oral Daily 02/03/24 0913 02/08/24 0959   02/02/24 2200  rifaximin  (XIFAXAN ) tablet 550 mg        550 mg Oral 2 times daily 02/02/24 1653     02/02/24 1000  cefTRIAXone  (ROCEPHIN ) 1 g in sodium chloride  0.9 % 100 mL IVPB        1 g 200 mL/hr over 30 Minutes Intravenous  Once 02/02/24 0958 02/02/24 1114   02/02/24 1000  azithromycin  (ZITHROMAX ) 500 mg in sodium chloride  0.9 % 250 mL IVPB        500 mg 250 mL/hr over 60 Minutes Intravenous  Once 02/02/24 0958 02/02/24 1307         Component  Value Date/Time   SDES EXPECTORATED SPUTUM 02/02/2024 1653   SDES EXPECTORATED SPUTUM 02/02/2024 1653   SPECREQUEST NONE 02/02/2024 1653   SPECREQUEST NONE Reflexed from [id number] 02/02/2024 1653   CULT  02/02/2024 1653    CULTURE REINCUBATED FOR BETTER GROWTH Performed at [facility], [address],  [place], [place] [address]    REPTSTATUS 02/03/2024 FINAL 02/02/2024 1653   REPTSTATUS PENDING 02/02/2024 1653    Medications reviewed:  Scheduled Meds:  azithromycin   500 mg Oral Daily   dextromethorphan -guaiFENesin   1 tablet Oral BID   digoxin   125 mcg Oral Daily   insulin  aspart  0-5 Units Subcutaneous QHS   insulin  aspart  0-9 Units Subcutaneous TID WC   insulin  glargine-yfgn  20 Units Subcutaneous Daily   lactulose   20 g Oral TID   lidocaine   3 patch Transdermal Q24H   metoprolol  tartrate  25 mg Oral BID   midodrine   10 mg Oral TID   pantoprazole   40 mg Oral BID   potassium chloride   40 mEq Oral Daily   potassium chloride   40 mEq Oral Once   rifaximin   550 mg Oral BID   spironolactone   75 mg Oral Daily   torsemide   40 mg Oral BID   Continuous Infusions:  cefTRIAXone  (ROCEPHIN )  IV 2 g (02/04/24 0842)    Lesa Rape, MD [facility] 02/04/2024, 11:59 AM

## 2024-02-04 NOTE — Plan of Care (Signed)
  Problem: Education: Goal: Knowledge of General Education information will improve Description: Including pain rating scale, medication(s)/side effects and non-pharmacologic comfort measures Outcome: Progressing   Problem: Health Behavior/Discharge Planning: Goal: Ability to manage health-related needs will improve Outcome: Progressing   Problem: Clinical Measurements: Goal: Ability to maintain clinical measurements within normal limits will improve Outcome: Progressing Goal: Will remain free from infection Outcome: Progressing Goal: Diagnostic test results will improve Outcome: Progressing Goal: Respiratory complications will improve Outcome: Progressing Goal: Cardiovascular complication will be avoided Outcome: Progressing   Problem: Activity: Goal: Risk for activity intolerance will decrease Outcome: Progressing   Problem: Nutrition: Goal: Adequate nutrition will be maintained Outcome: Progressing   Problem: Coping: Goal: Level of anxiety will decrease Outcome: Progressing   Problem: Elimination: Goal: Will not experience complications related to bowel motility Outcome: Progressing Goal: Will not experience complications related to urinary retention Outcome: Progressing   Problem: Pain Managment: Goal: General experience of comfort will improve and/or be controlled Outcome: Progressing   Problem: Safety: Goal: Ability to remain free from injury will improve Outcome: Progressing   Problem: Skin Integrity: Goal: Risk for impaired skin integrity will decrease Outcome: Progressing   Problem: Education: Goal: Ability to demonstrate management of disease process will improve Outcome: Progressing Goal: Ability to verbalize understanding of medication therapies will improve Outcome: Progressing Goal: Individualized Educational Video(s) Outcome: Progressing   Problem: Activity: Goal: Capacity to carry out activities will improve Outcome: Progressing    Problem: Cardiac: Goal: Ability to achieve and maintain adequate cardiopulmonary perfusion will improve Outcome: Progressing   Problem: Activity: Goal: Ability to tolerate increased activity will improve Outcome: Progressing   Problem: Respiratory: Goal: Ability to maintain adequate ventilation will improve Outcome: Progressing Goal: Ability to maintain a clear airway will improve Outcome: Progressing   Problem: Education: Goal: Ability to describe self-care measures that may prevent or decrease complications (Diabetes Survival Skills Education) will improve Outcome: Progressing Goal: Individualized Educational Video(s) Outcome: Progressing   Problem: Coping: Goal: Ability to adjust to condition or change in health will improve Outcome: Progressing   Problem: Fluid Volume: Goal: Ability to maintain a balanced intake and output will improve Outcome: Progressing   Problem: Health Behavior/Discharge Planning: Goal: Ability to identify and utilize available resources and services will improve Outcome: Progressing Goal: Ability to manage health-related needs will improve Outcome: Progressing   Problem: Metabolic: Goal: Ability to maintain appropriate glucose levels will improve Outcome: Progressing   Problem: Nutritional: Goal: Maintenance of adequate nutrition will improve Outcome: Progressing Goal: Progress toward achieving an optimal weight will improve Outcome: Progressing

## 2024-02-04 NOTE — Progress Notes (Signed)
 [facility] Gastroenterology Progress Note  Hannah Khan 68 y.o. 1956-04-03   Subjective: Sitting on side of bed about to eat solid lunch. Denies abdominal pain.  Objective: Vital signs: Vitals:   02/04/24 0727 02/04/24 1103  BP: (!) 117/49 138/68  Pulse: 100 95  Resp: 12 17  Temp: 98.7 F (37.1 C) 98 F (36.7 C)  SpO2: 97% 97%    Physical Exam: Gen: lethargic, no acute distress, morbidly obese, pleasant HEENT: anicteric sclera CV: RRR Chest: CTA B Abd: soft, nontender, nondistended, +BS   Lab Results: Recent Labs    02/02/24 1018 02/03/24 0812 02/04/24 0227  NA  --  132* 134*  K  --  4.2 3.4*  CL  --  100 102  CO2  --  20* 19*  GLUCOSE  --  206* 139*  BUN  --  34* 32*  CREATININE  --  2.11* 1.98*  CALCIUM   --  9.1 8.6*  MG 1.9  --   --    Recent Labs    02/02/24 0900  AST 29  ALT 17  ALKPHOS 69  BILITOT 2.4*  PROT 6.9  ALBUMIN  3.6   Recent Labs    02/02/24 0900 02/03/24 0812 02/04/24 1115  WBC 2.9* 3.0* 4.0  NEUTROABS 2.2  --   --   HGB 6.7* 7.3* 7.3*  HCT 21.0* 22.8* 23.9*  MCV 91.3 90.8 91.2  PLT 25* 30* 33*      Assessment/Plan: Anemia and heme positive stool in the setting of NASH cirrhosis and history of gastric and esophageal varices. No signs of active GI bleed. Hgb stable at 7.3. Brown stool. [facility] will sign off. Call if questions. F/U with Dr. Kimble Pennant in 6-8 weeks.   Hannah Khan 02/04/2024, 12:47 PM  Questions please call [phone] ID: Hannah Khan, female   DOB: [birth year], 68 y.o.   MRN: [id number]

## 2024-02-05 ENCOUNTER — Inpatient Hospital Stay (HOSPITAL_COMMUNITY)

## 2024-02-05 DIAGNOSIS — I5032 Chronic diastolic (congestive) heart failure: Secondary | ICD-10-CM | POA: Diagnosis not present

## 2024-02-05 LAB — CBC
HCT: 23.5 % — ABNORMAL LOW (ref 36.0–46.0)
Hemoglobin: 7.4 g/dL — ABNORMAL LOW (ref 12.0–15.0)
MCH: 29 pg (ref 26.0–34.0)
MCHC: 31.5 g/dL (ref 30.0–36.0)
MCV: 92.2 fL (ref 80.0–100.0)
Platelets: 28 10*3/uL — CL (ref 150–400)
RBC: 2.55 MIL/uL — ABNORMAL LOW (ref 3.87–5.11)
RDW: 15.5 % (ref 11.5–15.5)
WBC: 3.1 10*3/uL — ABNORMAL LOW (ref 4.0–10.5)
nRBC: 0 % (ref 0.0–0.2)

## 2024-02-05 LAB — BASIC METABOLIC PANEL WITH GFR
Anion gap: 10 (ref 5–15)
BUN: 27 mg/dL — ABNORMAL HIGH (ref 8–23)
CO2: 23 mmol/L (ref 22–32)
Calcium: 8.6 mg/dL — ABNORMAL LOW (ref 8.9–10.3)
Chloride: 102 mmol/L (ref 98–111)
Creatinine, Ser: 1.93 mg/dL — ABNORMAL HIGH (ref 0.44–1.00)
GFR, Estimated: 28 mL/min — ABNORMAL LOW (ref >60)
Glucose, Bld: 118 mg/dL — ABNORMAL HIGH (ref 70–99)
Potassium: 3.7 mmol/L (ref 3.5–5.1)
Sodium: 135 mmol/L (ref 135–145)

## 2024-02-05 LAB — GLUCOSE, CAPILLARY
Glucose-Capillary: 111 mg/dL — ABNORMAL HIGH (ref 70–99)
Glucose-Capillary: 144 mg/dL — ABNORMAL HIGH (ref 70–99)
Glucose-Capillary: 148 mg/dL — ABNORMAL HIGH (ref 70–99)
Glucose-Capillary: 134 mg/dL — ABNORMAL HIGH (ref 70–99)

## 2024-02-05 MED ORDER — POTASSIUM CHLORIDE CRYS ER 20 MEQ PO TBCR
20.0000 meq | EXTENDED_RELEASE_TABLET | Freq: Every day | ORAL | Status: DC
  Administered 2024-02-05 – 2024-02-07 (×3): 20 meq via ORAL
  Filled 2024-02-05 (×3): qty 1

## 2024-02-05 NOTE — Evaluation (Signed)
 Physical Therapy Evaluation Patient Details Name: Hannah Khan MRN: [id number] DOB: 02-23-1956 Today's Date: 02/05/2024  History of Present Illness  Pt is a 68 y/o F presenting to ED on 5/16 wtih SOB and URI x1 week, admitted for CHF exacerbation. PMH includes NASH liver cirrhosis, gastro-esophageal varices, A fib, pantocytopenia, CKD IIIB, obesity, A fib not on anticoagulation, DM, HTN  Clinical Impression  Pt presents to evaluation with decreased strength, decreased mobility, decreased activity tolerance, and impaired balance, all limiting patient's ability to mobilize near baseline. Pt was able to ambulate with AD and no physical assistance, requiring one seated break due to pt reported onset of instability in legs. Pt reports confidence in ability to safely negotiate stairs at home upon discharge; negotiation techniques discussed. Pt would benefit from further ambulation and stair negotiation training. PT will continue to treat patient while she is admitted. Recommending OPPT upon discharge to address remaining mobility deficits and optimize patient's return to prior level of function.       If plan is discharge home, recommend the following: A little help with walking and/or transfers;A little help with bathing/dressing/bathroom;Assist for transportation;Help with stairs or ramp for entrance   Can travel by private vehicle        Equipment Recommendations None recommended by PT  Recommendations for Other Services       Functional Status Assessment Patient has had a recent decline in their functional status and demonstrates the ability to make significant improvements in function in a reasonable and predictable amount of time.     Precautions / Restrictions Precautions Precautions: Fall Recall of Precautions/Restrictions: Intact Restrictions Weight Bearing Restrictions Per Provider Order: No      Mobility  Bed Mobility Overal bed mobility: Modified Independent                   Transfers Overall transfer level: Needs assistance Equipment used: Rollator (4 wheels) Transfers: Sit to/from Stand Sit to Stand: Contact guard assist           General transfer comment: Pt completed STS from bedside chair with rollator and CGA. Pt pushes up from chair with bilateral upper extremities. Requires cueing for rollator set-up. Pt completed STS from rollator with bilateraul UE pushing up from device and CGA.    Ambulation/Gait Ambulation/Gait assistance: Supervision Gait Distance (Feet): 50 Feet Assistive device: Rollator (4 wheels) Gait Pattern/deviations: Wide base of support, Step-through pattern, Decreased stride length Gait velocity: decreased Gait velocity interpretation: <1.8 ft/sec, indicate of risk for recurrent falls   General Gait Details: Pt ambulated 50' x2 w/ RW and CGA. Pt demonstrates reciprocal gait, decreased cadence, and increased reliance on rollator.  Stairs            Wheelchair Mobility     Tilt Bed    Modified Rankin (Stroke Patients Only)       Balance Overall balance assessment: Needs assistance Sitting-balance support: No upper extremity supported, Feet supported Sitting balance-Leahy Scale: Good     Standing balance support: Bilateral upper extremity supported, During functional activity, Reliant on assistive device for balance Standing balance-Leahy Scale: Poor                               Pertinent Vitals/Pain Pain Assessment Pain Assessment: 0-10 Pain Score: 4  Breathing: normal Pain Location: bilateral lower extremities Pain Descriptors / Indicators: Sore, Discomfort, Tightness Pain Intervention(s): Monitored during session, Limited activity within patient's tolerance  Home Living Family/patient expects to be discharged to:: Private residence Living Arrangements: Spouse/significant other Available Help at Discharge: Family;Available 24 hours/day;Neighbor Type of Home: House Home  Access: Stairs to enter Entrance Stairs-Rails: None Entrance Stairs-Number of Steps: 1 (1 in garage; 2 in the front)   Home Layout: One level Home Equipment: Rollator (4 wheels);BSC/3in1;Tub bench;Cane - single point;Wheelchair - manual Additional Comments: CPAP at night and adjustable bed    Prior Function Prior Level of Function : Independent/Modified Independent             Mobility Comments: pt reports mobilizing using a rollator for house and some community level distances. For long distances in the community, pt reports using manual WC. ADLs Comments: pt reports independent with all ADLs. Spouse does most of the cooking and chores.     Extremity/Trunk Assessment   Upper Extremity Assessment Upper Extremity Assessment: Generalized weakness    Lower Extremity Assessment Lower Extremity Assessment: Generalized weakness    Cervical / Trunk Assessment Cervical / Trunk Assessment: Kyphotic (rounded shoulders, significant forward flexion of neck)  Communication   Communication Communication: No apparent difficulties    Cognition Arousal: Alert Behavior During Therapy: WFL for tasks assessed/performed   PT - Cognitive impairments: No apparent impairments                         Following commands: Intact       Cueing Cueing Techniques: Verbal cues     General Comments General comments (skin integrity, edema, etc.): Vitals remained stable througout session.    Exercises     Assessment/Plan    PT Assessment Patient needs continued PT services  PT Problem List Decreased strength;Decreased range of motion;Decreased activity tolerance;Decreased balance;Decreased mobility;Cardiopulmonary status limiting activity;Obesity       PT Treatment Interventions DME instruction;Gait training;Stair training;Functional mobility training;Therapeutic activities;Therapeutic exercise;Balance training;Patient/family education    PT Goals (Current goals can be found in  the Care Plan section)  Acute Rehab PT Goals Patient Stated Goal: to return to prior level of function PT Goal Formulation: With patient Time For Goal Achievement: 02/19/24 Potential to Achieve Goals: Good    Frequency Min 2X/week     Co-evaluation               AM-PAC PT "6 Clicks" Mobility  Outcome Measure Help needed turning from your back to your side while in a flat bed without using bedrails?: A Little Help needed moving from lying on your back to sitting on the side of a flat bed without using bedrails?: A Little Help needed moving to and from a bed to a chair (including a wheelchair)?: A Little Help needed standing up from a chair using your arms (e.g., wheelchair or bedside chair)?: A Little Help needed to walk in hospital room?: A Little Help needed climbing 3-5 steps with a railing? : A Little 6 Click Score: 18    End of Session Equipment Utilized During Treatment: Gait belt Activity Tolerance: Patient tolerated treatment well Patient left: in chair;with call bell/phone within reach Nurse Communication: Mobility status PT Visit Diagnosis: Repeated falls (R29.6);Muscle weakness (generalized) (M62.81);History of falling (Z91.81)    Time: 1478-2956 PT Time Calculation (min) (ACUTE ONLY): 25 min   Charges:   PT Evaluation $PT Eval Low Complexity: 1 Low   PT General Charges $$ ACUTE PT VISIT: 1 Visit         Lonell Rives, SPT Acute Rehab [phone]   Lonell Rives 02/05/2024, 4:08 PM

## 2024-02-05 NOTE — Evaluation (Signed)
 Occupational Therapy Evaluation Patient Details Name: Hannah Khan MRN: [id number] DOB: September 28, 1955 Today's Date: 02/05/2024   History of Present Illness   Pt is a 68 y/o F presenting to ED on 5/16 with SOB and URI x1 week, admitted for CHF exacerbation. PMH includes NASH liver cirrhosis, gastro-esophageal varices, A fib, pantocytopenia, CKD IIIB, obesity, A fib not on anticoagulation, DM, HTN     Clinical Impressions Pt reports ind at baseline with ADLs and uses rollator for functional mobility, lives with spouse who assists with some IADLs. Needing set up-CGA for ADLs, and CGA for transfers with RW. Pt reports feeling weaker than normal, noted 1/4 DOE after short distance ambulation in room. Pt presenting with impairments listed below, will follow acutely. Recommend OP OT at d/c.      If plan is discharge home, recommend the following:   A little help with walking and/or transfers;A little help with bathing/dressing/bathroom;Assistance with cooking/housework;Direct supervision/assist for medications management;Direct supervision/assist for financial management;Help with stairs or ramp for entrance;Supervision due to cognitive status     Functional Status Assessment   Patient has had a recent decline in their functional status and demonstrates the ability to make significant improvements in function in a reasonable and predictable amount of time.     Equipment Recommendations   None recommended by OT (pt has all needed DME)     Recommendations for Other Services   PT consult     Precautions/Restrictions   Precautions Precautions: Fall Restrictions Weight Bearing Restrictions Per Provider Order: No     Mobility Bed Mobility               General bed mobility comments: OOB in chair upon arrival and departure    Transfers Overall transfer level: Needs assistance Equipment used: Rollator (4 wheels) Transfers: Sit to/from Stand Sit to Stand: Contact guard  assist                  Balance Overall balance assessment: Needs assistance Sitting-balance support: No upper extremity supported, Feet supported Sitting balance-Leahy Scale: Good     Standing balance support: Bilateral upper extremity supported, During functional activity, Reliant on assistive device for balance Standing balance-Leahy Scale: Poor Standing balance comment: reliant on external support                           ADL either performed or assessed with clinical judgement   ADL Overall ADL's : Needs assistance/impaired Eating/Feeding: Set up;Sitting   Grooming: Set up;Sitting   Upper Body Bathing: Contact guard assist;Sitting   Lower Body Bathing: Contact guard assist;Sitting/lateral leans   Upper Body Dressing : Contact guard assist;Sitting   Lower Body Dressing: Contact guard assist;Sitting/lateral leans   Toilet Transfer: Supervision/safety;Ambulation;Rolling walker (2 wheels)   Toileting- Clothing Manipulation and Hygiene: Contact guard assist       Functional mobility during ADLs: Contact guard assist;Rolling walker (2 wheels)       Vision   Vision Assessment?: No apparent visual deficits     Perception Perception: Not tested       Praxis Praxis: Not tested       Pertinent Vitals/Pain Pain Assessment Pain Assessment: No/denies pain     Extremity/Trunk Assessment Upper Extremity Assessment Upper Extremity Assessment: Generalized weakness   Lower Extremity Assessment Lower Extremity Assessment: Defer to PT evaluation   Cervical / Trunk Assessment Cervical / Trunk Assessment: Kyphotic   Communication Communication Communication: No apparent difficulties   Cognition Arousal:  Alert Behavior During Therapy: WFL for tasks assessed/performed Cognition: No apparent impairments                               Following commands: Intact       Cueing  General Comments   Cueing Techniques: Verbal  cues  VSS   Exercises     Shoulder Instructions      Home Living Family/patient expects to be discharged to:: Private residence Living Arrangements: Spouse/significant other Available Help at Discharge: Family;Available 24 hours/day Type of Home: House Home Access: Stairs to enter [employer] of Steps: 1-2 Entrance Stairs-Rails: None Home Layout: One level     Bathroom Shower/Tub: Chief Strategy Officer: Standard Bathroom Accessibility: Yes   Home Equipment: Rollator (4 wheels);Tub bench;BSC/3in1;Hand held shower head;Adaptive equipment Adaptive Equipment: Other (Comment) (toileting aid) Additional Comments: CPAP at night and adjustable bed      Prior Functioning/Environment Prior Level of Function : Independent/Modified Independent;Driving             Mobility Comments: rollator at all times, uses electric shopping cart in the community ADLs Comments: ind with ADL, manages own meds, does light meal prep, drives some    OT Problem List: Decreased strength;Decreased range of motion;Impaired balance (sitting and/or standing);Decreased activity tolerance   OT Treatment/Interventions: Self-care/ADL training;Therapeutic exercise;Energy conservation;DME and/or AE instruction;Therapeutic activities;Patient/family education;Balance training      OT Goals(Current goals can be found in the care plan section)   Acute Rehab OT Goals Patient Stated Goal: none stated OT Goal Formulation: With patient Time For Goal Achievement: 02/19/24 Potential to Achieve Goals: Good ADL Goals Pt Will Perform Upper Body Dressing: with modified independence;sitting Pt Will Perform Lower Body Dressing: with modified independence;sitting/lateral leans;sit to/from stand Pt Will Transfer to Toilet: with modified independence;ambulating;regular height toilet Pt Will Perform Tub/Shower Transfer: Tub transfer;Shower transfer;Independently;ambulating;tub bench Additional ADL  Goal #1: pt will verbalize x3 energy conservation strategies in prep for ADLs   OT Frequency:  Min 2X/week    Co-evaluation              AM-PAC OT "6 Clicks" Daily Activity     Outcome Measure Help from another person eating meals?: A Little Help from another person taking care of personal grooming?: A Little Help from another person toileting, which includes using toliet, bedpan, or urinal?: A Little Help from another person bathing (including washing, rinsing, drying)?: A Little Help from another person to put on and taking off regular upper body clothing?: A Little Help from another person to put on and taking off regular lower body clothing?: A Little 6 Click Score: 18   End of Session Equipment Utilized During Treatment: Rolling walker (2 wheels) Nurse Communication: Mobility status  Activity Tolerance: Patient tolerated treatment well Patient left: in chair;with call bell/phone within reach  OT Visit Diagnosis: Muscle weakness (generalized) (M62.81)                Time: 1610-9604 OT Time Calculation (min): 17 min Charges:  OT General Charges $OT Visit: 1 Visit OT Evaluation $OT Eval Low Complexity: 1 Low  Benedict Brain, OTD, OTR/L SecureChat Preferred Acute Rehab [phone]   Miki Labuda K Koonce 02/05/2024, 4:48 PM

## 2024-02-05 NOTE — Progress Notes (Addendum)
\\   Latest Reference Range & Units 02/05/24 02:04  WBC 4.0 - 10.5 K/uL 3.1 (L)  RBC 3.87 - 5.11 MIL/uL 2.55 (L)  Hemoglobin 12.0 - 15.0 g/dL 7.4 (L)  HCT 65.7 - 84.6 % 23.5 (L)  MCV 80.0 - 100.0 fL 92.2  MCH 26.0 - 34.0 pg 29.0  MCHC 30.0 - 36.0 g/dL 96.2  RDW 95.2 - 84.1 % 15.5  Platelets 150 - 400 K/uL 28 (LL)  nRBC 0.0 - 0.2 % 0.0  (LL): Data is critically low (L): Data is abnormally low  Labs sent to Dr. Glynda Lash.  Waiting response. No orders at this time, will continue to monitor for bleeding.

## 2024-02-05 NOTE — Progress Notes (Signed)
 PROGRESS NOTE Hannah GRITZ  XLK:[id number] DOB: [birth year] DOA: 02/02/2024 PCP: Lanae Pinal, MD  Brief Narrative/Hospital Course: 68 y/o F  with h/o NASH Liver cirrhosis with gastro-esophageal varices, A fib , pancytopenia, CKD 3B b/l bun/creat 21/1.6,severe morbid obesity, A-fib not on anticoagulation, diabetes, hypertension presented to ED 02/02/24 with shortness of breath and upper respiratory symptoms x 1 week, had sick contact with husband.  Patient was seen by PCP, there was "mild congestion" on the chest x-ray.She is short of breath and having cough w/ greenish sputum , no fever, no abdomen pain, no chest pain. She had run out of neb so was using her husband's at home. No focal weakness no nausea vomiting or and has some  loose stool from from lactulose  In the ED: Hemodynamically stable although mild tachycardic, saturating initially room air subsequently needed 2 L [place], afebrile. Labs-creatinine elevated to 2.0 from baseline 1.6, normal LFTs, proBNP 1655 troponin 30> 27, WBC 2.9 hemoglobin 6.7 platelet 25K-baseline value on 07/11/2023 hemoglobin 9 neurology 3.3 platelet 36 but as low as 19K before. Patient noticed to have chronic leg swelling  which is better than before. She has having wheezing right side crepitus  EKG A-fib with RVR CXR pleural effusion with underlying consolidation -atelectasis vs infiltrate. viral panel -ve.  Patient was started on azitho/rocephin /IV lasix /nebs in ED and further admission requested for CHF possible pneumonia and anemia. [place] + and GI consulted.   Subjective: Seen and examined Reports she is not feeling well today Overnight afebrile on room air , BP 105-116 Labs showed hemoglobin creatinine holding about the same  Assessment and plan:   Acute on chronic anemia, symptomatic Hemoccult positive: pancytopenia from liver cirrhosis, with Hemoccult positive  stool but without overt bleeding/or hematemesis.   S/p 1 u PRBC.Had 2 units in fall and 1 unit  in feb.Anemia panel stable B12 folate iron Hemoglobin holding in low 7 g range.  GI following no plan for EGD in absence of overt bleeding  Recent Labs  Lab 02/02/24 0900 02/03/24 0812 02/04/24 1115 02/05/24 0204  HGB 6.7* 7.3* 7.3* 7.4*  HCT 21.0* 22.8* 23.9* 23.5*    CHFpEF w/ mild exacerbation/fluid overload: Last echo with EF 55 to 60% no RWMA RV function normal PTA on metolazone  2.5  PRN, midodrine , metoprolol  torsemide  80 mg in am 40mg  in pm, aldactone  323 dry wt at baseline per her on admit 297lb > down to 291lb >288 lb and w/ chronic leg edema- improving Cxray-pleural effusion question underlying consolidation versus atelectasis> repeat chest x-ray: this am unchanged-with mild to moderate diffuse pulmonary congestion Continue torsemide  40mg  bid, Aldactone .Cont to monitor daily I/O,weight, electrolytes and net balance as below.Keep on  salt/fluid restricted diet and monitor in tele. Net IO Since Admission: -2,128.03 mL [02/05/24 1253]  Filed Weights   02/03/24 0423 02/04/24 0417 02/05/24 0351  Weight: 134.4 kg 132.4 kg 131 kg    Recent Labs  Lab 02/02/24 0900 02/02/24 1018 02/03/24 0812 02/04/24 0227 02/05/24 0204  PROBNP 1,655.0*  --   --   --   --   BUN 37*  --  34* 32* 27*  CREATININE 2.05*  --  2.11* 1.98* 1.93*  K 5.0  --  4.2 3.4* 3.7  MG  --  1.9  --   --   --     NASH cirrhosis Pancytopenia History of gastroesophageal varices: No overt bleeding or hematemesis.  GI following continue PPI  She had hematemesis while in [place] in Feb felt to be  gastroenteritis and was airflown to [facility].  There was plan for TIPS- sees Dr Jinx Mourning and was aborted in first one due ot fast HR and had 2nd procdure done which was only half. Has appointment at [facility] on June 4 w/  Liver Transplant team. GI following currently   Acute hypoxic respiratory failure: In the setting of pneumonia, resolved.On RA.   Suspected bacterial CAP: Given her liver cirrhosis, immunocompromise  state, continue empiric antibiotics ceftriaxone , azithromycin , Pro-Cal 0.3  AKI on CKD3B Mild hypokalemia Metabolic acidosis due to AKI/CKD: baseline  ~1.6, remains elevated but slightly better downtrending tolerating torsemide  at lower dose Avoid hypotension and NSAIDs, monitor electrolytes.  Chronic back pain: Continue pain control.  Lidocaine  patch, added oxycodone  as needed for severe pain   A-fib with RVR: Rate overall stable.  Continue home digoxin , metoprolol , midodrine .Not on anticoagulation PTA.   Hypertension: BP stable   Diabetes mellitus on long-term insulin  PTA on Tresiba  25 units daily, resumed  at 20 u along with SSI Recent Labs  Lab 02/02/24 1807 02/02/24 2100 02/04/24 1117 02/04/24 1612 02/04/24 2119 02/05/24 0559 02/05/24 1107  GLUCAP  --    < > 144* 179* 117* 111* 144*  HGBA1C 6.0*  --   --   --   --   --   --    < > = values in this interval not displayed.    OSA: Continue home  CPAP tonight   Pancytopenia/thrombocytopenia: Chronic issue, stable monitor and transfuse  prn Recent Labs  Lab 02/03/24 0812 02/04/24 1115 02/05/24 0204  HGB 7.3* 7.3* 7.4*  HCT 22.8* 23.9* 23.5*  WBC 3.0* 4.0 3.1*  PLT 30* 33* 28*    Morbid obesity with BMI 46: Will benefit with PCP follow-up weight loss healthy lifestyle.  Deconditioning/Debility: PTOT eval requested  Goals of care: Currently full code, she has complex medical comorbidities, she is high risk for readmission.  Prognosis depends to be seen  DVT prophylaxis: SCDs Start: 02/02/24 1653 Code Status:   Code Status: Full Code Family Communication: plan of care discussed with patient at bedside. Patient status is: Remains hospitalized because of severity of illness Level of care: Telemetry Cardiac   Dispo: The patient is from: home            Anticipated disposition: TBD-PT OT requested. Objective: Vitals last 24 hrs: Vitals:   02/05/24 0351 02/05/24 0755 02/05/24 0756 02/05/24 1108  BP: 105/60  116/64 116/64 114/68  Pulse: 100 (!) 113 (!) 101 84  Resp: 13 18 19 17   Temp: 98 F (36.7 C) 98 F (36.7 C) 98 F (36.7 C) 98.8 F (37.1 C)  TempSrc: Oral Oral  Oral  SpO2: 96%  97%   Weight: 131 kg     Height:        Physical Examination: General exam: alert awake, oriented  weak obese HEENT:Oral mucosa moist, Ear/Nose WNL grossly Respiratory system: Bilaterally diminished,no use of accessory muscle Cardiovascular system: S1 & S2 +, No JVD. Gastrointestinal system: Abdomen soft,NT,ND, BS+ Nervous System: Alert, awake, moving all extremities,and following commands. Extremities: LE edema + chronic appearin,distal peripheral pulses palpable and warm.  Skin: No rashes,no icterus. MSK: Normal muscle bulk,tone, power   Data Reviewed: I have personally reviewed following labs and imaging studies ( see epic result tab) CBC: Recent Labs  Lab 02/02/24 0900 02/03/24 0812 02/04/24 1115 02/05/24 0204  WBC 2.9* 3.0* 4.0 3.1*  NEUTROABS 2.2  --   --   --   HGB 6.7* 7.3*  7.3* 7.4*  HCT 21.0* 22.8* 23.9* 23.5*  MCV 91.3 90.8 91.2 92.2  PLT 25* 30* 33* 28*   CMP: Recent Labs  Lab 02/02/24 0900 02/02/24 1018 02/03/24 0812 02/04/24 0227 02/05/24 0204  NA 132*  --  132* 134* 135  K 5.0  --  4.2 3.4* 3.7  CL 102  --  100 102 102  CO2 19*  --  20* 19* 23  GLUCOSE 182*  --  206* 139* 118*  BUN 37*  --  34* 32* 27*  CREATININE 2.05*  --  2.11* 1.98* 1.93*  CALCIUM  9.2  --  9.1 8.6* 8.6*  MG  --  1.9  --   --   --    GFR: Estimated Creatinine Clearance: 39.9 mL/min (A) (by C-G formula based on SCr of 1.93 mg/dL (H)). Recent Labs  Lab 02/02/24 0900  AST 29  ALT 17  ALKPHOS 69  BILITOT 2.4*  PROT 6.9  ALBUMIN  3.6   No results for input(s): "LIPASE", "AMYLASE" in the last 168 hours. No results for input(s): "AMMONIA" in the last 168 hours. Coagulation Profile: No results for input(s): "INR", "PROTIME" in the last 168 hours. Unresulted Labs (From admission, onward)     Start      Ordered   02/03/24 0500  Basic metabolic panel with GFR  Daily,   R     Question:  Specimen collection method  Answer:  Lab=Lab collect   02/02/24 1659   02/03/24 0500  CBC  Daily,   R     Question:  Specimen collection method  Answer:  Lab=Lab collect   02/02/24 1659           Antimicrobials/Microbiology: Anti-infectives (From admission, onward)    Start     Dose/Rate Route Frequency Ordered Stop   02/03/24 1000  cefTRIAXone  (ROCEPHIN ) 2 g in sodium chloride  0.9 % 100 mL IVPB        2 g 200 mL/hr over 30 Minutes Intravenous Every 24 hours 02/02/24 1653 02/08/24 0959   02/03/24 1000  azithromycin  (ZITHROMAX ) 500 mg in sodium chloride  0.9 % 250 mL IVPB  Status:  Discontinued        500 mg 250 mL/hr over 60 Minutes Intravenous Every 24 hours 02/02/24 1653 02/03/24 0913   02/03/24 1000  azithromycin  (ZITHROMAX ) tablet 500 mg        500 mg Oral Daily 02/03/24 0913 02/08/24 0959   02/02/24 2200  rifaximin  (XIFAXAN ) tablet 550 mg        550 mg Oral 2 times daily 02/02/24 1653     02/02/24 1000  cefTRIAXone  (ROCEPHIN ) 1 g in sodium chloride  0.9 % 100 mL IVPB        1 g 200 mL/hr over 30 Minutes Intravenous  Once 02/02/24 0958 02/02/24 1114   02/02/24 1000  azithromycin  (ZITHROMAX ) 500 mg in sodium chloride  0.9 % 250 mL IVPB        500 mg 250 mL/hr over 60 Minutes Intravenous  Once 02/02/24 0958 02/02/24 1307         Component Value Date/Time   SDES EXPECTORATED SPUTUM 02/02/2024 1653   SDES EXPECTORATED SPUTUM 02/02/2024 1653   SPECREQUEST NONE 02/02/2024 1653   SPECREQUEST NONE Reflexed from [id number] 02/02/2024 1653   CULT  02/02/2024 1653    CULTURE REINCUBATED FOR BETTER GROWTH Performed at [facility], [address], [place], [place] [address]    REPTSTATUS 02/03/2024 FINAL 02/02/2024 1653   REPTSTATUS PENDING 02/02/2024 1653  Medications reviewed:  Scheduled Meds:  azithromycin   500 mg Oral Daily   dextromethorphan -guaiFENesin   1 tablet Oral BID    digoxin   125 mcg Oral Daily   insulin  aspart  0-5 Units Subcutaneous QHS   insulin  aspart  0-9 Units Subcutaneous TID WC   insulin  glargine-yfgn  20 Units Subcutaneous Daily   lactulose   20 g Oral TID   lidocaine   3 patch Transdermal Q24H   metoprolol  tartrate  25 mg Oral BID   midodrine   10 mg Oral TID   pantoprazole   40 mg Oral BID   potassium chloride   20 mEq Oral Daily   rifaximin   550 mg Oral BID   spironolactone   75 mg Oral Daily   torsemide   40 mg Oral BID   Continuous Infusions:  cefTRIAXone  (ROCEPHIN )  IV 2 g (02/05/24 0902)    Lesa Rape, MD [facility] 02/05/2024, 12:55 PM

## 2024-02-05 NOTE — Plan of Care (Signed)
  Problem: Education: Goal: Knowledge of General Education information will improve Description: Including pain rating scale, medication(s)/side effects and non-pharmacologic comfort measures Outcome: Progressing   Problem: Health Behavior/Discharge Planning: Goal: Ability to manage health-related needs will improve Outcome: Progressing   Problem: Clinical Measurements: Goal: Ability to maintain clinical measurements within normal limits will improve Outcome: Progressing Goal: Will remain free from infection Outcome: Progressing Goal: Diagnostic test results will improve Outcome: Progressing Goal: Respiratory complications will improve Outcome: Progressing Goal: Cardiovascular complication will be avoided Outcome: Progressing   Problem: Activity: Goal: Risk for activity intolerance will decrease Outcome: Progressing   Problem: Nutrition: Goal: Adequate nutrition will be maintained Outcome: Progressing   Problem: Coping: Goal: Level of anxiety will decrease Outcome: Progressing   Problem: Elimination: Goal: Will not experience complications related to bowel motility Outcome: Progressing Goal: Will not experience complications related to urinary retention Outcome: Progressing   Problem: Pain Managment: Goal: General experience of comfort will improve and/or be controlled Outcome: Progressing   Problem: Safety: Goal: Ability to remain free from injury will improve Outcome: Progressing   Problem: Skin Integrity: Goal: Risk for impaired skin integrity will decrease Outcome: Progressing   Problem: Education: Goal: Ability to demonstrate management of disease process will improve Outcome: Progressing Goal: Ability to verbalize understanding of medication therapies will improve Outcome: Progressing Goal: Individualized Educational Video(s) Outcome: Progressing   Problem: Activity: Goal: Capacity to carry out activities will improve Outcome: Progressing    Problem: Cardiac: Goal: Ability to achieve and maintain adequate cardiopulmonary perfusion will improve Outcome: Progressing   Problem: Activity: Goal: Ability to tolerate increased activity will improve Outcome: Progressing   Problem: Clinical Measurements: Goal: Ability to maintain a body temperature in the normal range will improve Outcome: Progressing   Problem: Respiratory: Goal: Ability to maintain adequate ventilation will improve Outcome: Progressing Goal: Ability to maintain a clear airway will improve Outcome: Progressing   Problem: Education: Goal: Ability to describe self-care measures that may prevent or decrease complications (Diabetes Survival Skills Education) will improve Outcome: Progressing Goal: Individualized Educational Video(s) Outcome: Progressing   Problem: Coping: Goal: Ability to adjust to condition or change in health will improve Outcome: Progressing   Problem: Fluid Volume: Goal: Ability to maintain a balanced intake and output will improve Outcome: Progressing   Problem: Health Behavior/Discharge Planning: Goal: Ability to identify and utilize available resources and services will improve Outcome: Progressing Goal: Ability to manage health-related needs will improve Outcome: Progressing   Problem: Metabolic: Goal: Ability to maintain appropriate glucose levels will improve Outcome: Progressing   Problem: Nutritional: Goal: Maintenance of adequate nutrition will improve Outcome: Progressing Goal: Progress toward achieving an optimal weight will improve Outcome: Progressing   Problem: Skin Integrity: Goal: Risk for impaired skin integrity will decrease Outcome: Progressing   Problem: Tissue Perfusion: Goal: Adequacy of tissue perfusion will improve Outcome: Progressing

## 2024-02-06 DIAGNOSIS — I5032 Chronic diastolic (congestive) heart failure: Secondary | ICD-10-CM | POA: Diagnosis not present

## 2024-02-06 LAB — CBC
HCT: 23.5 % — ABNORMAL LOW (ref 36.0–46.0)
Hemoglobin: 7.3 g/dL — ABNORMAL LOW (ref 12.0–15.0)
MCH: 28.3 pg (ref 26.0–34.0)
MCHC: 31.1 g/dL (ref 30.0–36.0)
MCV: 91.1 fL (ref 80.0–100.0)
Platelets: 34 10*3/uL — ABNORMAL LOW (ref 150–400)
RBC: 2.58 MIL/uL — ABNORMAL LOW (ref 3.87–5.11)
RDW: 15.4 % (ref 11.5–15.5)
WBC: 3.3 10*3/uL — ABNORMAL LOW (ref 4.0–10.5)
nRBC: 0 % (ref 0.0–0.2)

## 2024-02-06 LAB — BASIC METABOLIC PANEL WITH GFR
Anion gap: 9 (ref 5–15)
BUN: 29 mg/dL — ABNORMAL HIGH (ref 8–23)
CO2: 25 mmol/L (ref 22–32)
Calcium: 8.8 mg/dL — ABNORMAL LOW (ref 8.9–10.3)
Chloride: 100 mmol/L (ref 98–111)
Creatinine, Ser: 1.81 mg/dL — ABNORMAL HIGH (ref 0.44–1.00)
GFR, Estimated: 30 mL/min — ABNORMAL LOW (ref >60)
Glucose, Bld: 137 mg/dL — ABNORMAL HIGH (ref 70–99)
Potassium: 3.7 mmol/L (ref 3.5–5.1)
Sodium: 134 mmol/L — ABNORMAL LOW (ref 135–145)

## 2024-02-06 LAB — CULTURE, RESPIRATORY W GRAM STAIN: Culture: NORMAL

## 2024-02-06 LAB — GLUCOSE, CAPILLARY
Glucose-Capillary: 122 mg/dL — ABNORMAL HIGH (ref 70–99)
Glucose-Capillary: 157 mg/dL — ABNORMAL HIGH (ref 70–99)
Glucose-Capillary: 115 mg/dL — ABNORMAL HIGH (ref 70–99)
Glucose-Capillary: 174 mg/dL — ABNORMAL HIGH (ref 70–99)

## 2024-02-06 LAB — GROUP A STREP BY PCR: Group A Strep by PCR: NOT DETECTED

## 2024-02-06 MED ORDER — CEFADROXIL 500 MG PO CAPS
500.0000 mg | ORAL_CAPSULE | Freq: Two times a day (BID) | ORAL | Status: DC
  Administered 2024-02-06 – 2024-02-07 (×3): 500 mg via ORAL
  Filled 2024-02-06 (×4): qty 1

## 2024-02-06 NOTE — Care Management Important Message (Signed)
 Important Message  Patient Details  Name: Hannah Khan MRN: [id number] Date of Birth: 1956-05-18   Important Message Given:  Yes - Medicare IM     Wynonia Hedges 02/06/2024, 3:08 PM

## 2024-02-06 NOTE — Progress Notes (Addendum)
 Mobility Specialist Progress Note;   02/06/24 0937  Mobility  Activity Ambulated with assistance in hallway  Level of Assistance Standby assist, set-up cues, supervision of patient - no hands on  Assistive Device Four wheel walker  Distance Ambulated (ft) 100 ft  Activity Response Tolerated well  Mobility Referral Yes  Mobility visit 1 Mobility  Mobility Specialist Start Time (ACUTE ONLY) [id number]  Mobility Specialist Stop Time (ACUTE ONLY) 0950  Mobility Specialist Time Calculation (min) (ACUTE ONLY) 13 min   Pt agreeable to mobility. Required no physical assistance during ambulation, SV. VSS on RA, HR up to 120 w/ activity. Pt took 1x seated rest break d/t fatigue, no SOB displayed. Pt returned safely back to bed with all needs met.   Janit Meline Mobility Specialist Please contact via SecureChat or [employer] [phone]

## 2024-02-06 NOTE — Plan of Care (Signed)
   Problem: Education: Goal: Knowledge of General Education information will improve Description Including pain rating scale, medication(s)/side effects and non-pharmacologic comfort measures Outcome: Progressing   Problem: Health Behavior/Discharge Planning: Goal: Ability to manage health-related needs will improve Outcome: Progressing

## 2024-02-06 NOTE — Progress Notes (Signed)
 PROGRESS NOTE Hannah Khan  NWG:[id number] DOB: 1955-11-08 DOA: 02/02/2024 PCP: Lanae Pinal, MD  Brief Narrative/Hospital Course: 68 y/o F  with h/o NASH Liver cirrhosis with gastro-esophageal varices, A fib , pancytopenia, CKD 3B b/l bun/creat 21/1.6,severe morbid obesity, A-fib not on anticoagulation, diabetes, hypertension presented to ED 02/02/24 with shortness of breath and upper respiratory symptoms x 1 week, had sick contact with husband.  Patient was seen by PCP, there was "mild congestion" on the chest x-ray.She is short of breath and having cough w/ greenish sputum , no fever, no abdomen pain, no chest pain. She had run out of neb so was using her husband's at home. No focal weakness no nausea vomiting or and has some  loose stool from from lactulose  In the ED: Hemodynamically stable although mild tachycardic, saturating initially room air subsequently needed 2 L [place], afebrile. Labs-creatinine elevated to 2.0 from baseline 1.6, normal LFTs, proBNP 1655 troponin 30> 27, WBC 2.9 hemoglobin 6.7 platelet 25K-baseline value on 07/11/2023 hemoglobin 9 neurology 3.3 platelet 36 but as low as 19K before. Patient noticed to have chronic leg swelling  which is better than before. She has having wheezing right side crepitus  EKG A-fib with RVR CXR pleural effusion with underlying consolidation -atelectasis vs infiltrate. viral panel -ve.  Patient was started on azitho/rocephin /IV lasix /nebs in ED and further admission requested for CHF possible pneumonia and anemia. [place] + and GI consulted.   Subjective: Seen and examined Still using BiPAP reports sore throat again this morning Overnight afebrile vital stable otherwise Labs showed low hemoglobin about the same creatinine slightly better Assessment and plan:   Acute on chronic anemia, symptomatic Hemoccult positive: pancytopenia from liver cirrhosis, with Hemoccult positive  stool but without overt bleeding/or hematemesis.   S/p 1 u PRBC.Had  2 units in fall and 1 unit in feb.Anemia panel stable B12 folate iron Hemoglobin holding in low 7 g range.  GI following no plan for EGD in absence of overt bleeding hemoglobin remains low but stable Recent Labs  Lab 02/02/24 0900 02/03/24 0812 02/04/24 1115 02/05/24 0204 02/06/24 0240  HGB 6.7* 7.3* 7.3* 7.4* 7.3*  HCT 21.0* 22.8* 23.9* 23.5* 23.5*    CHFpEF w/ mild exacerbation/fluid overload: Last echo with EF 55 to 60% no RWMA RV function normal PTA on metolazone  2.5  PRN, midodrine , metoprolol  torsemide  80 mg in am 40mg  in pm, aldactone  323 dry wt at baseline per her on admit 297lb > down to 291lb >288> 289lb, has chronic leg edema stable Cxray-pleural effusion? underlying consolidation versus atelectasis> repeat chest x-ray: Repeat is unchanged  with mild to moderate diffuse pulmonary congestion> Continue torsemide  40mg  bid, Aldactone , creatinine improving Cont to monitor daily I/O,weight, electrolytes and net balance as below.Keep on  salt/fluid restricted diet and monitor in tele. Net IO Since Admission: -3,878.03 mL [02/06/24 1106]  Filed Weights   02/04/24 0417 02/05/24 0351 02/06/24 0439  Weight: 132.4 kg 131 kg 131.1 kg    Recent Labs  Lab 02/02/24 0900 02/02/24 1018 02/03/24 0812 02/04/24 0227 02/05/24 0204 02/06/24 0240  PROBNP 1,655.0*  --   --   --   --   --   BUN 37*  --  34* 32* 27* 29*  CREATININE 2.05*  --  2.11* 1.98* 1.93* 1.81*  K 5.0  --  4.2 3.4* 3.7 3.7  MG  --  1.9  --   --   --   --     NASH cirrhosis Pancytopenia History of gastroesophageal varices:  No overt bleeding or hematemesis.  GI following continue PPI  She had hematemesis while in [place] in Feb felt to be gastroenteritis and was airflown to [facility].  There was plan for TIPS- sees Dr Jinx Mourning and was aborted in first one due ot fast HR and had 2nd procdure done which was only half. Has appointment at [facility] on June 4 w/  Liver Transplant team. GI following currently   Acute hypoxic  respiratory failure: In the setting of pneumonia, resolved.On RA.   Suspected bacterial CAP Sore throat Viral-like illness: Given her liver cirrhosis, immunocompromise state, continue empiric antibiotics > changed to oral antibiotics, Pro-Cal 0.3 Checking strep throat  AKI on CKD3B Mild hypokalemia Metabolic acidosis due to AKI/CKD: baseline  ~1.6, peaked to 2.1 but downtrending and tolerating torsemide  Aldactone  Continue monitor renal function,  avoid hypotension and NSAIDs, monitor electrolytes. Recent Labs  Lab 02/02/24 0900 02/03/24 0812 02/04/24 0227 02/05/24 0204 02/06/24 0240  BUN 37* 34* 32* 27* 29*  CREATININE 2.05* 2.11* 1.98* 1.93* 1.81*    Chronic back pain: Continue pain control.  Lidocaine  patch, added oxycodone  as needed for severe pain   A-fib with RVR: Rate overall stable.  Continue home digoxin , metoprolol , midodrine .Not on anticoagulation PTA.   Hypertension: BP stable   Diabetes mellitus on long-term insulin  PTA on Tresiba  25 units daily, continue  at 20 u along with SSI Recent Labs  Lab 02/02/24 1807 02/02/24 2100 02/05/24 0559 02/05/24 1107 02/05/24 1535 02/05/24 2106 02/06/24 0610  GLUCAP  --    < > 111* 144* 148* 134* 122*  HGBA1C 6.0*  --   --   --   --   --   --    < > = values in this interval not displayed.    OSA: Continue home  CPAP    Pancytopenia/thrombocytopenia: Chronic issue, stable monitor and transfuse  prn Recent Labs  Lab 02/04/24 1115 02/05/24 0204 02/06/24 0240  HGB 7.3* 7.4* 7.3*  HCT 23.9* 23.5* 23.5*  WBC 4.0 3.1* 3.3*  PLT 33* 28* 34*    Morbid obesity with BMI 46: Will benefit with PCP follow-up weight loss healthy lifestyle.  Deconditioning/Debility: PTOT eval requested> advised outpatient PT  Goals of care: Currently full code, she has complex medical comorbidities, she is high risk for readmission.  Prognosis remains to be seen.  DVT prophylaxis: SCDs Start: 02/02/24 1653 Code Status:   Code  Status: Full Code Family Communication: plan of care discussed with patient at bedside. Patient status is: Remains hospitalized because of severity of illness Level of care: Telemetry Cardiac   Dispo: The patient is from: home            Anticipated disposition: Anticipate send dispo 24 to 48 hours   Objective: Vitals last 24 hrs: Vitals:   02/05/24 1914 02/05/24 2321 02/06/24 0439 02/06/24 0846  BP: (!) 131/56 (!) 107/53 (!) 109/51 (!) 107/47  Pulse: 89 81 91   Resp: 17 17 15 16   Temp: 97.9 F (36.6 C) 97.8 F (36.6 C) 97.9 F (36.6 C) 98 F (36.7 C)  TempSrc: Axillary Oral Oral Oral  SpO2: 98% 97% 97% 100%  Weight:   131.1 kg   Height:        Physical Examination:  General exam: alert awake, oriented, ill-appearing HEENT:Oral mucosa moist, Ear/Nose WNL grossly Respiratory system: Bilaterally diminished  BS,no use of accessory muscle Cardiovascular system: S1 & S2 +, No JVD. Gastrointestinal system: Abdomen soft,NT,ND, BS+ Nervous System: Alert, awake, moving all  extremities,and following commands. Extremities: LE edema+ chronic,distal peripheral pulses palpable and warm.  Skin: No rashes,no icterus. MSK: Normal muscle bulk,tone, power   Data Reviewed: I have personally reviewed following labs and imaging studies ( see epic result tab) CBC: Recent Labs  Lab 02/02/24 0900 02/03/24 0812 02/04/24 1115 02/05/24 0204 02/06/24 0240  WBC 2.9* 3.0* 4.0 3.1* 3.3*  NEUTROABS 2.2  --   --   --   --   HGB 6.7* 7.3* 7.3* 7.4* 7.3*  HCT 21.0* 22.8* 23.9* 23.5* 23.5*  MCV 91.3 90.8 91.2 92.2 91.1  PLT 25* 30* 33* 28* 34*   CMP: Recent Labs  Lab 02/02/24 0900 02/02/24 1018 02/03/24 0812 02/04/24 0227 02/05/24 0204 02/06/24 0240  NA 132*  --  132* 134* 135 134*  K 5.0  --  4.2 3.4* 3.7 3.7  CL 102  --  100 102 102 100  CO2 19*  --  20* 19* 23 25  GLUCOSE 182*  --  206* 139* 118* 137*  BUN 37*  --  34* 32* 27* 29*  CREATININE 2.05*  --  2.11* 1.98* 1.93* 1.81*   CALCIUM  9.2  --  9.1 8.6* 8.6* 8.8*  MG  --  1.9  --   --   --   --    GFR: Estimated Creatinine Clearance: 42.6 mL/min (A) (by C-G formula based on SCr of 1.81 mg/dL (H)). Recent Labs  Lab 02/02/24 0900  AST 29  ALT 17  ALKPHOS 69  BILITOT 2.4*  PROT 6.9  ALBUMIN  3.6   No results for input(s): "LIPASE", "AMYLASE" in the last 168 hours. No results for input(s): "AMMONIA" in the last 168 hours. Coagulation Profile: No results for input(s): "INR", "PROTIME" in the last 168 hours. Unresulted Labs (From admission, onward)     Start     Ordered   02/06/24 1016  Group A Strep by PCR  ONCE - URGENT,   URGENT        02/06/24 1015   02/03/24 0500  Basic metabolic panel with GFR  Daily,   R     Question:  Specimen collection method  Answer:  Lab=Lab collect   02/02/24 1659   02/03/24 0500  CBC  Daily,   R     Question:  Specimen collection method  Answer:  Lab=Lab collect   02/02/24 1659           Antimicrobials/Microbiology: Anti-infectives (From admission, onward)    Start     Dose/Rate Route Frequency Ordered Stop   02/06/24 1115  cefadroxil  (DURICEF) capsule 500 mg        500 mg Oral 2 times daily 02/06/24 1017     02/03/24 1000  cefTRIAXone  (ROCEPHIN ) 2 g in sodium chloride  0.9 % 100 mL IVPB  Status:  Discontinued        2 g 200 mL/hr over 30 Minutes Intravenous Every 24 hours 02/02/24 1653 02/06/24 1017   02/03/24 1000  azithromycin  (ZITHROMAX ) 500 mg in sodium chloride  0.9 % 250 mL IVPB  Status:  Discontinued        500 mg 250 mL/hr over 60 Minutes Intravenous Every 24 hours 02/02/24 1653 02/03/24 0913   02/03/24 1000  azithromycin  (ZITHROMAX ) tablet 500 mg        500 mg Oral Daily 02/03/24 0913 02/08/24 0959   02/02/24 2200  rifaximin  (XIFAXAN ) tablet 550 mg        550 mg Oral 2 times daily 02/02/24 1653  02/02/24 1000  cefTRIAXone  (ROCEPHIN ) 1 g in sodium chloride  0.9 % 100 mL IVPB        1 g 200 mL/hr over 30 Minutes Intravenous  Once 02/02/24 0958 02/02/24 1114    02/02/24 1000  azithromycin  (ZITHROMAX ) 500 mg in sodium chloride  0.9 % 250 mL IVPB        500 mg 250 mL/hr over 60 Minutes Intravenous  Once 02/02/24 0958 02/02/24 1307         Component Value Date/Time   SDES EXPECTORATED SPUTUM 02/02/2024 1653   SDES EXPECTORATED SPUTUM 02/02/2024 1653   SPECREQUEST NONE 02/02/2024 1653   SPECREQUEST NONE Reflexed from [id number] 02/02/2024 1653   CULT  02/02/2024 1653    CULTURE REINCUBATED FOR BETTER GROWTH Performed at [facility], [address] [place], [place] [address]    REPTSTATUS 02/03/2024 FINAL 02/02/2024 1653   REPTSTATUS PENDING 02/02/2024 1653    Medications reviewed:  Scheduled Meds:  azithromycin   500 mg Oral Daily   cefadroxil   500 mg Oral BID   dextromethorphan -guaiFENesin   1 tablet Oral BID   digoxin   125 mcg Oral Daily   insulin  aspart  0-5 Units Subcutaneous QHS   insulin  aspart  0-9 Units Subcutaneous TID WC   insulin  glargine-yfgn  20 Units Subcutaneous Daily   lactulose   20 g Oral TID   lidocaine   3 patch Transdermal Q24H   metoprolol  tartrate  25 mg Oral BID   midodrine   10 mg Oral TID   pantoprazole   40 mg Oral BID   potassium chloride   20 mEq Oral Daily   rifaximin   550 mg Oral BID   spironolactone   75 mg Oral Daily   torsemide   40 mg Oral BID   Continuous Infusions:    Lesa Rape, MD [facility] 02/06/2024, 11:07 AM

## 2024-02-06 NOTE — Progress Notes (Signed)
 [facility] Liaison Note  02/06/2024  Hannah Khan Mar 18, 1956 [id number]  Location: RN Hospital Liaison screened the patient remotely at [facility].  Insurance: [facility] HMO   Hannah Khan is a 68 y.o. female who is a Primary Care Patient of [place], [place], MD [facility] [place]. The patient was screened for readmission hospitalization with noted high risk score for unplanned readmission risk with 1 IP/1 ED in 6 months.  Chart was reviewed and  patient's electronic medical record reveals patient was admitted with Symptomatic anemia. Pt has an [facility] provider who has a care management team to follow pt. No VBCI services will be extended at this time.    VBCI Care Management/Population Health does not replace or interfere with any arrangements made by the Inpatient Transition of Care team.   For questions contact:   Lilla Reichert, RN, BSN Hospital Liaison [place]   [facility]), Population Health Office Hours MTWF  8:00 am-6:00 pm Direct Dial: [phone] mobile [email or web address]

## 2024-02-06 NOTE — Plan of Care (Signed)
  Problem: Education: Goal: Knowledge of General Education information will improve Description: Including pain rating scale, medication(s)/side effects and non-pharmacologic comfort measures Outcome: Progressing   Problem: Health Behavior/Discharge Planning: Goal: Ability to manage health-related needs will improve Outcome: Progressing   Problem: Clinical Measurements: Goal: Ability to maintain clinical measurements within normal limits will improve Outcome: Progressing Goal: Will remain free from infection Outcome: Progressing Goal: Diagnostic test results will improve Outcome: Progressing Goal: Respiratory complications will improve Outcome: Progressing Goal: Cardiovascular complication will be avoided Outcome: Progressing   Problem: Activity: Goal: Risk for activity intolerance will decrease Outcome: Progressing   Problem: Nutrition: Goal: Adequate nutrition will be maintained Outcome: Progressing   Problem: Coping: Goal: Level of anxiety will decrease Outcome: Progressing   Problem: Elimination: Goal: Will not experience complications related to bowel motility Outcome: Progressing Goal: Will not experience complications related to urinary retention Outcome: Progressing   Problem: Pain Managment: Goal: General experience of comfort will improve and/or be controlled Outcome: Progressing   Problem: Safety: Goal: Ability to remain free from injury will improve Outcome: Progressing   Problem: Skin Integrity: Goal: Risk for impaired skin integrity will decrease Outcome: Progressing   Problem: Education: Goal: Ability to demonstrate management of disease process will improve Outcome: Progressing Goal: Ability to verbalize understanding of medication therapies will improve Outcome: Progressing Goal: Individualized Educational Video(s) Outcome: Progressing   Problem: Activity: Goal: Capacity to carry out activities will improve Outcome: Progressing    Problem: Cardiac: Goal: Ability to achieve and maintain adequate cardiopulmonary perfusion will improve Outcome: Progressing   Problem: Activity: Goal: Ability to tolerate increased activity will improve Outcome: Progressing   Problem: Clinical Measurements: Goal: Ability to maintain a body temperature in the normal range will improve Outcome: Progressing   Problem: Respiratory: Goal: Ability to maintain adequate ventilation will improve Outcome: Progressing Goal: Ability to maintain a clear airway will improve Outcome: Progressing   Problem: Education: Goal: Ability to describe self-care measures that may prevent or decrease complications (Diabetes Survival Skills Education) will improve Outcome: Progressing Goal: Individualized Educational Video(s) Outcome: Progressing   Problem: Coping: Goal: Ability to adjust to condition or change in health will improve Outcome: Progressing   Problem: Fluid Volume: Goal: Ability to maintain a balanced intake and output will improve Outcome: Progressing   Problem: Health Behavior/Discharge Planning: Goal: Ability to identify and utilize available resources and services will improve Outcome: Progressing Goal: Ability to manage health-related needs will improve Outcome: Progressing   Problem: Metabolic: Goal: Ability to maintain appropriate glucose levels will improve Outcome: Progressing   Problem: Nutritional: Goal: Maintenance of adequate nutrition will improve Outcome: Progressing Goal: Progress toward achieving an optimal weight will improve Outcome: Progressing   Problem: Skin Integrity: Goal: Risk for impaired skin integrity will decrease Outcome: Progressing

## 2024-02-06 NOTE — Progress Notes (Signed)
 RT NOTE: PT has home CPAP unit at bedside. PT is not ready to go on yet, but she states she does not need any assistance and will put CPAP on when ready to go to sleep.

## 2024-02-07 DIAGNOSIS — I5032 Chronic diastolic (congestive) heart failure: Secondary | ICD-10-CM | POA: Diagnosis not present

## 2024-02-07 LAB — TYPE AND SCREEN
ABO/RH(D): O POS
Antibody Screen: POSITIVE
Donor AG Type: NEGATIVE
Donor AG Type: NEGATIVE
Unit division: 0
Unit division: 0

## 2024-02-07 LAB — BPAM RBC
Blood Product Expiration Date: 202506102359
Blood Product Expiration Date: 202506152359
ISSUE DATE / TIME: 202505170109
Unit Type and Rh: 5100
Unit Type and Rh: 5100

## 2024-02-07 LAB — CBC
HCT: 23.2 % — ABNORMAL LOW (ref 36.0–46.0)
Hemoglobin: 7.2 g/dL — ABNORMAL LOW (ref 12.0–15.0)
MCH: 28.8 pg (ref 26.0–34.0)
MCHC: 31 g/dL (ref 30.0–36.0)
MCV: 92.8 fL (ref 80.0–100.0)
Platelets: 29 10*3/uL — CL (ref 150–400)
RBC: 2.5 MIL/uL — ABNORMAL LOW (ref 3.87–5.11)
RDW: 15.5 % (ref 11.5–15.5)
WBC: 2.8 10*3/uL — ABNORMAL LOW (ref 4.0–10.5)
nRBC: 0 % (ref 0.0–0.2)

## 2024-02-07 LAB — GLUCOSE, CAPILLARY
Glucose-Capillary: 104 mg/dL — ABNORMAL HIGH (ref 70–99)
Glucose-Capillary: 135 mg/dL — ABNORMAL HIGH (ref 70–99)

## 2024-02-07 LAB — CULTURE, BLOOD (ROUTINE X 2): Culture: NO GROWTH

## 2024-02-07 LAB — BASIC METABOLIC PANEL WITH GFR
Anion gap: 10 (ref 5–15)
BUN: 29 mg/dL — ABNORMAL HIGH (ref 8–23)
CO2: 24 mmol/L (ref 22–32)
Calcium: 8.7 mg/dL — ABNORMAL LOW (ref 8.9–10.3)
Chloride: 100 mmol/L (ref 98–111)
Creatinine, Ser: 1.75 mg/dL — ABNORMAL HIGH (ref 0.44–1.00)
GFR, Estimated: 32 mL/min — ABNORMAL LOW (ref >60)
Glucose, Bld: 127 mg/dL — ABNORMAL HIGH (ref 70–99)
Potassium: 3.6 mmol/L (ref 3.5–5.1)
Sodium: 134 mmol/L — ABNORMAL LOW (ref 135–145)

## 2024-02-07 MED ORDER — CEFADROXIL 500 MG PO CAPS: 500.0000 mg | ORAL_CAPSULE | Freq: Two times a day (BID) | ORAL | 0 refills | Status: AC

## 2024-02-07 MED ORDER — POTASSIUM CHLORIDE CRYS ER 20 MEQ PO TBCR
40.0000 meq | EXTENDED_RELEASE_TABLET | Freq: Once | ORAL | Status: AC
  Administered 2024-02-07: 40 meq via ORAL
  Filled 2024-02-07: qty 2

## 2024-02-07 NOTE — Discharge Summary (Signed)
 Physician Discharge Summary  Hannah Khan NFA:[id number] DOB: Dec 07, 1955 DOA: 02/02/2024  PCP: Lanae Pinal, MD  Admit date: 02/02/2024 Discharge date: 02/07/2024 Recommendations for Outpatient Follow-up:  Follow up with PCP in 1 weeks-call for appointment Please obtain BMP/CBC in one week  Discharge Dispo: home Discharge Condition: Stable Code Status:   Code Status: Full Code Diet recommendation:  Diet Order             Diet Carb Modified Fluid consistency: Thin; Room service appropriate? Yes; Fluid restriction: 1500 mL Fluid  Diet effective now                    Brief/Interim Summary: 68 y/o F  with h/o NASH Liver cirrhosis with gastro-esophageal varices, A fib , pancytopenia, CKD 3B b/l bun/creat 21/1.6,severe morbid obesity, A-fib not on anticoagulation, diabetes, hypertension presented to ED 02/02/24 with shortness of breath and upper respiratory symptoms x 1 week, had sick contact with husband.  Patient was seen by PCP, there was "mild congestion" on the chest x-ray.She is short of breath and having cough w/ greenish sputum , no fever, no abdomen pain, no chest pain. She had run out of neb so was using her husband's at home. No focal weakness no nausea vomiting or and has some  loose stool from from lactulose  In the ED: Hemodynamically stable although mild tachycardic, saturating initially room air subsequently needed 2 L [place], afebrile. Labs-creatinine elevated to 2.0 from baseline 1.6, normal LFTs, proBNP 1655 troponin 30> 27, WBC 2.9 hemoglobin 6.7 platelet 25K-baseline value on 07/11/2023 hemoglobin 9 neurology 3.3 platelet 36 but as low as 19K before. Patient noticed to have chronic leg swelling  which is better than before. She has having wheezing right side crepitus  EKG A-fib with RVR CXR pleural effusion with underlying consolidation -atelectasis vs infiltrate. viral panel -ve.  Patient was started on azitho/rocephin /IV lasix /nebs in ED and further admission requested  for CHF possible pneumonia and anemia. [place] + and GI consulted.   Subjective: Seen and examined Feels much better and agreeable for discharge home Overnight afebrile BP stable and soft Weight about the same at 289  Labs remains on lower side but stable   Discharge diagnosis:  Acute on chronic anemia, symptomatic Hemoccult positive: pancytopenia from liver cirrhosis, with Hemoccult positive  stool but without overt bleeding/or hematemesis.   S/p 1 u PRBC.Had 2 units in fall and 1 unit in feb.Anemia panel stable B12 folate iron Hb stable ~7 g range.GI has seen the patient and no plan for EGD in absence of overt bleeding hemoglobin remains low but stable Advised to follow-up with PCP to monitor hemoglobin. Recent Labs  Lab 02/03/24 0812 02/04/24 1115 02/05/24 0204 02/06/24 0240 02/07/24 0212  HGB 7.3* 7.3* 7.4* 7.3* 7.2*  HCT 22.8* 23.9* 23.5* 23.5* 23.2*    CHFpEF w/ mild exacerbation/fluid overload: Last echo with EF 55 to 60% no RWMA RV function normal PTA on metolazone  2.5  PRN, midodrine , metoprolol  torsemide  80 mg in am 40mg  in pm, aldactone  323 dry wt at baseline per her on admit 297lb > down to 291lb >288> 289lb, has chronic leg edema stable Cxray-pleural effusion? underlying consolidation versus atelectasis> repeat chest x-ray: Repeat is unchanged  with mild to moderate diffuse pulmonary congestion>Continue torsemide  40mg  bid, Aldactone , as creatinine continues to improve. Cont to monitor daily I/O,weight, electrolytes and net balance as below.Keep on  salt/fluid restricted diet and monitor in tele. Net IO Since Admission: -6,798.03 mL [02/07/24 0808]  [employer]  02/05/24 0351 02/06/24 0439 02/07/24 0500  Weight: 131 kg 131.1 kg 131.1 kg    Recent Labs  Lab 02/02/24 0900 02/02/24 1018 02/03/24 0812 02/04/24 0227 02/05/24 0204 02/06/24 0240 02/07/24 0212  PROBNP 1,655.0*  --   --   --   --   --   --   BUN 37*  --  34* 32* 27* 29* 29*  CREATININE 2.05*  --   2.11* 1.98* 1.93* 1.81* 1.75*  K 5.0  --  4.2 3.4* 3.7 3.7 3.6  MG  --  1.9  --   --   --   --   --     NASH cirrhosis Pancytopenia History of gastroesophageal varices: No overt bleeding or hematemesis.  GI following continue PPI  She had hematemesis while in [place] in Feb felt to be gastroenteritis and was airflown to [facility].  There was plan for TIPS- sees Dr Jinx Mourning and was aborted in first one due ot fast HR and had 2nd procdure done which was only half. [facility] at [facility] on June [facility] w/  Liver Transplant team. GI following currently   Acute hypoxic respiratory failure: It has resolved    Suspected bacterial CAP Sore throat Viral-like illness: Given her liver cirrhosis, immunocompromise state, managing with IV antibiotics > changed to oral 5/22 creatinine stable afebrile.  Strep throat negative  Encourage PT OT ambulation  AKI on CKD3B Mild hypokalemia Metabolic acidosis due to AKI/CKD: baseline  ~1.6, peaked to 2.1 but downtrending and close to baseline.   Continue diuretics as able and monitor renal function,  avoid hypotension and NSAIDs, monitor electrolytes. Recent Labs  Lab 02/03/24 0812 02/04/24 0227 02/05/24 0204 02/06/24 0240 02/07/24 0212  BUN 34* 32* 27* 29* 29*  CREATININE 2.11* 1.98* 1.93* 1.81* 1.75*    Chronic back pain: Continue pain control.  Lidocaine  patch, added oxycodone  as needed for severe pain   A-fib with RVR: Rate overall stable.  Continue home digoxin , metoprolol , midodrine .Not on anticoagulation PTA.   Hypertension: BP stable, at times soft in 100s.   Diabetes mellitus on long-term insulin  PTA on Tresiba  25 units daily, well controlled on semglee  20 u along with SSI Recent Labs  Lab 02/02/24 1807 02/02/24 2100 02/06/24 0610 02/06/24 1148 02/06/24 1559 02/06/24 2059 02/07/24 0612  GLUCAP  --    < > 122* 157* 115* 174* 104*  HGBA1C 6.0*  --   --   --   --   --   --    < > = values in this interval not displayed.     OSA: Continue home  CPAP    Pancytopenia/thrombocytopenia: Chronic issue, stable monitor and transfuse  prn Recent Labs  Lab 02/05/24 0204 02/06/24 0240 02/07/24 0212  HGB 7.4* 7.3* 7.2*  HCT 23.5* 23.5* 23.2*  WBC 3.1* 3.3* 2.8*  PLT 28* 34* 29*    Morbid obesity with BMI 46: Will benefit with PCP follow-up weight loss healthy lifestyle.  Deconditioning/Debility: PTOT eval requested> advised outpatient PT  Goals of care: Currently full code, she has complex medical comorbidities. Prognosis remains remains guarded, patient is at risk of readmission.   Discharge Exam: Vitals:   02/07/24 0831 02/07/24 0839  BP: (!) 130/44   Pulse:  90  Resp: 17   Temp: 97.7 F (36.5 C)   SpO2:     General: Pt is alert, awake, not in acute distress Cardiovascular: RRR, S1/S2 +, no rubs, no gallops Respiratory: CTA bilaterally, no wheezing, no rhonchi Abdominal: Soft,  NT, ND, bowel sounds + Extremities: no edema, no cyanosis  Discharge Instructions  Discharge Instructions     (HEART FAILURE PATIENTS) Call MD:  Anytime you have any of the following symptoms: 1) 3 pound weight gain in 24 hours or 5 pounds in 1 week 2) shortness of breath, with or without a dry hacking cough 3) swelling in the hands, feet or stomach 4) if you have to sleep on extra pillows at night in order to breathe.   Complete by: As directed    Discharge instructions   Complete by: As directed    Please call call MD or return to ER for similar or worsening recurring problem that brought you to hospital or if any fever,nausea/vomiting,abdominal pain, uncontrolled pain, chest pain,  shortness of breath or any other alarming symptoms.  Please follow-up your doctor as instructed in a week time and call the office for appointment.  Please avoid alcohol , smoking, or any other illicit substance and maintain healthy habits including taking your regular medications as prescribed.  You were cared for by a hospitalist during  your hospital stay. If you have any questions about your discharge medications or the care you received while you were in the hospital after you are discharged, you can call the unit and ask to speak with the hospitalist on call if the hospitalist that took care of you is not available.  Once you are discharged, your primary care physician will handle any further medical issues. Please note that NO REFILLS for any discharge medications will be authorized once you are discharged, as it is imperative that you return to your primary care physician (or establish a relationship with a primary care physician if you do not have one) for your aftercare needs so that they can reassess your need for medications and monitor your lab values   Increase activity slowly   Complete by: As directed       Allergies as of 02/07/2024       Reactions   Celebrex [celecoxib] Other (See Comments)   "speech slurred" with celebrex   Shellfish Allergy Swelling, Rash, Other (See Comments)   TINGLING AND SWELLING OF LIPS. LARGE WHELPS, QUARTER-SIZE   Barium-containing Compounds Other (See Comments)   TACHYCARDIA   Prednisone Other (See Comments)   TACHYCARDIA   Statins Other (See Comments)   AGGRAVATED FIBROMYALGIA   Fentanyl  Other (See Comments)   Hallucinations    Ibuprofen  Other (See Comments)   UNSPECIFIED SPECIFIC REACTION >> "DUE TO PLATELETS"   Sglt2 Inhibitors Other (See Comments)   Ozempic , Severe gastroparesis    Tramadol  Other (See Comments)   Hallucination   Tylenol  [acetaminophen ] Other (See Comments)   UNSPECIFIED SPECIFIC REACTION >> "DUE TO PLATELETS"   Zetia [ezetimibe] Other (See Comments)   Headache        Medication List     TAKE these medications    albuterol  108 (90 Base) MCG/ACT inhaler Commonly known as: VENTOLIN  HFA Inhale 1-2 puffs into the lungs every 6 (six) hours as needed for wheezing or shortness of breath.   benzonatate  100 MG capsule Commonly known as:  TESSALON  Take 100 mg by mouth 3 (three) times daily as needed for cough.   cefadroxil  500 MG capsule Commonly known as: DURICEF Take 1 capsule (500 mg total) by mouth 2 (two) times daily for 4 days.   digoxin  0.125 MG tablet Commonly known as: LANOXIN  Take 1 tablet (125 mcg total) by mouth daily.   FreeStyle Libre 3 Plus Sensor  Misc Apply 1 each topically as directed. Place on arm as directed and change every 15 days.   lactulose  10 GM/15ML solution Commonly known as: CHRONULAC  Take 20 g by mouth 3 (three) times daily.   metolazone  2.5 MG tablet Commonly known as: ZAROXOLYN  TAKE 1 TABLET (2.5 MG TOTAL) BY MOUTH AS NEEDED (AS DIRECTED BY [facility] CLINIC).   metoprolol  tartrate 25 MG tablet Commonly known as: LOPRESSOR  Take 1 tablet (25 mg total) by mouth 2 (two) times daily.   midodrine  10 MG tablet Commonly known as: PROAMATINE  Take 1 tablet (10 mg total) by mouth 3 (three) times daily.   OneTouch Delica Plus Lancet33G Misc Apply topically 3 (three) times daily.   OneTouch Verio test strip Generic drug: glucose blood 1 each 3 (three) times daily.   pantoprazole  40 MG tablet Commonly known as: PROTONIX  Take 40 mg by mouth 2 (two) times daily.   potassium chloride  10 MEQ CR capsule Commonly known as: MICRO-K  Take 10 capsules (100 mEq total) by mouth 2 (two) times daily. Take an extra 4 tablets when taking Metolazone .   PreserVision AREDS 2 Caps Take 1 capsule by mouth daily.   spironolactone  25 MG tablet Commonly known as: ALDACTONE  Take 3 tablets (75 mg total) by mouth daily. What changed:  how much to take additional instructions   spironolactone  50 MG tablet Commonly known as: ALDACTONE  Take 50 mg by mouth daily. Take with 25 mg tablet for a total dose of 75 mg daily. What changed: Another medication with the same name was changed. Make sure you understand how and when to take each.   Systane Balance 0.6 % Soln Generic drug: Propylene Glycol Place 1 drop into  both eyes 2 (two) times daily.   torsemide  20 MG tablet Commonly known as: DEMADEX  Take 5 tablets (100 mg total) by mouth every morning for 5 days, THEN 2 tablets (40 mg total) every evening for 5 days, THEN 5 tablets (100 mg total) daily. Start taking on: August 02, 2023 What changed: See the new instructions.   Tresiba  FlexTouch 100 UNIT/ML FlexTouch Pen Generic drug: insulin  degludec Inject 24 Units into the skin daily. (TITRATE WHEN DIRECTED UP TO 50 UNITS)   Xifaxan  550 MG Tabs tablet Generic drug: rifaximin  Take 550 mg by mouth 2 (two) times daily.        Follow-up Information     Koirala, Dibas, MD Follow up in 1 week(s).   Specialty: Family Medicine Contact information: [address] [place] [address] [phone]         [facility] Follow up.   Specialty: Rehabilitation Why: They will call you to set up apt times , if you do not hear from them in three business days,  please give them a call. Contact information: [address] [place]  [address] [phone]               Allergies  Allergen Reactions   Celebrex [Celecoxib] Other (See Comments)    "speech slurred" with celebrex   Shellfish Allergy Swelling, Rash and Other (See Comments)    TINGLING AND SWELLING OF LIPS. LARGE WHELPS, QUARTER-SIZE   Barium-Containing Compounds Other (See Comments)    TACHYCARDIA   Prednisone Other (See Comments)    TACHYCARDIA   Statins Other (See Comments)    AGGRAVATED FIBROMYALGIA   Fentanyl  Other (See Comments)    Hallucinations    Ibuprofen  Other (See Comments)  UNSPECIFIED SPECIFIC REACTION >> "DUE TO PLATELETS"   Sglt2 Inhibitors Other (See Comments)    Ozempic , Severe gastroparesis    Tramadol  Other (See Comments)    Hallucination    Tylenol  [Acetaminophen ] Other (See Comments)    UNSPECIFIED SPECIFIC REACTION >> "DUE TO PLATELETS"   Zetia [Ezetimibe]  Other (See Comments)    Headache    The results of significant diagnostics from this hospitalization (including imaging, microbiology, ancillary and laboratory) are listed below for reference.    Microbiology: Recent Results (from the past 240 hours)  Resp panel by RT-PCR (RSV, Flu A&B, Covid) Anterior Nasal Swab     Status: None   Collection Time: 02/02/24  4:23 AM   Specimen: Anterior Nasal Swab  Result Value Ref Range Status   SARS Coronavirus 2 by RT PCR NEGATIVE NEGATIVE Final    Comment: (NOTE) SARS-CoV-2 target nucleic acids are NOT DETECTED.  The SARS-CoV-2 RNA is generally detectable in upper respiratory specimens during the acute phase of infection. The lowest concentration of SARS-CoV-2 viral copies this assay can detect is 138 copies/mL. A negative result does not preclude SARS-Cov-2 infection and should not be used as the sole basis for treatment or other patient management decisions. A negative result may occur with  improper specimen collection/handling, submission of specimen other than nasopharyngeal swab, presence of viral mutation(s) within the areas targeted by this assay, and inadequate number of viral copies(<138 copies/mL). A negative result must be combined with clinical observations, patient history, and epidemiological information. The expected result is Negative.  Fact Sheet for Patients:  BloggerCourse.com  Fact Sheet for Healthcare Providers:  SeriousBroker.it  This test is no t yet approved or cleared by the [place]  FDA and  has been authorized for detection and/or diagnosis of SARS-CoV-2 by FDA under an Emergency Use Authorization (EUA). This EUA will remain  in effect (meaning this test can be used) for the duration of the COVID-19 declaration under Section 564(b)(1) of the Act, 21 U.S.C.section 360bbb-3(b)(1), unless the authorization is terminated  or revoked sooner.       Influenza A  by PCR NEGATIVE NEGATIVE Final   Influenza B by PCR NEGATIVE NEGATIVE Final    Comment: (NOTE) The Xpert Xpress SARS-CoV-2/FLU/RSV plus assay is intended as an aid in the diagnosis of influenza from Nasopharyngeal swab specimens and should not be used as a sole basis for treatment. Nasal washings and aspirates are unacceptable for Xpert Xpress SARS-CoV-2/FLU/RSV testing.  Fact Sheet for Patients: BloggerCourse.com  Fact Sheet for Healthcare Providers: SeriousBroker.it  This test is not yet approved or cleared by the [place]  FDA and has been authorized for detection and/or diagnosis of SARS-CoV-2 by FDA under an Emergency Use Authorization (EUA). This EUA will remain in effect (meaning this test can be used) for the duration of the COVID-19 declaration under Section 564(b)(1) of the Act, 21 U.S.C. section 360bbb-3(b)(1), unless the authorization is terminated or revoked.     Resp Syncytial Virus by PCR NEGATIVE NEGATIVE Final    Comment: (NOTE) Fact Sheet for Patients: BloggerCourse.com  Fact Sheet for Healthcare Providers: SeriousBroker.it  This test is not yet approved or cleared by the [place]  FDA and has been authorized for detection and/or diagnosis of SARS-CoV-2 by FDA under an Emergency Use Authorization (EUA). This EUA will remain in effect (meaning this test can be used) for the duration of the COVID-19 declaration under Section 564(b)(1) of the Act, 21 U.S.C. section 360bbb-3(b)(1), unless the authorization is terminated or revoked.  Performed at [facility], [address] [place], [place] [address]   Blood culture (routine x 2)     Status: None   Collection Time: 02/02/24 10:15 AM   Specimen: BLOOD  Result Value Ref Range Status   Specimen Description   Final    BLOOD RIGHT ANTECUBITAL Performed at [facility], [address] [place], [place] [address]    Special Requests   Final    BOTTLES DRAWN AEROBIC AND ANAEROBIC Blood Culture results may not be optimal due to an inadequate volume of blood received in culture bottles Performed at [facility], [address] [place], [place] [address]    Culture   Final    NO GROWTH 5 DAYS Performed at [facility], [address], [place], [place] [address]    Report Status 02/07/2024 FINAL  Final  MRSA Next Gen by PCR, Nasal     Status: None   Collection Time: 02/02/24  3:47 PM   Specimen: Nasal Mucosa; Nasal Swab  Result Value Ref Range Status   MRSA by PCR Next Gen NOT DETECTED NOT DETECTED Final    Comment: (NOTE) The GeneXpert MRSA Assay (FDA approved for NASAL specimens only), is one component of a comprehensive MRSA colonization surveillance program. It is not intended to diagnose MRSA infection nor to guide or monitor treatment for MRSA infections. Test performance is not FDA approved in patients less than 88 years old. Performed at [facility], [address], [place], [place] [address]   Expectorated Sputum Assessment w Gram Stain, Rflx to Resp Cult     Status: None   Collection Time: 02/02/24  4:53 PM   Specimen: Expectorated Sputum  Result Value Ref Range Status   Specimen Description EXPECTORATED SPUTUM  Final   Special Requests NONE  Final   Sputum evaluation   Final    THIS SPECIMEN IS ACCEPTABLE FOR SPUTUM CULTURE Performed at [facility], [address], [place], [place] [address]    Report Status 02/03/2024 FINAL  Final  Culture, Respiratory w Gram Stain     Status: None   Collection Time: 02/02/24  4:53 PM  Result Value Ref Range Status   Specimen Description EXPECTORATED SPUTUM  Final   Special Requests NONE Reflexed from [id number]  Final   Gram Stain   Final    FEW SQUAMOUS EPITHELIAL CELLS PRESENT RARE GRAM POSITIVE COCCI RARE GRAM NEGATIVE RODS    Culture   Final    FEW Normal respiratory  flora-no Staph aureus or Pseudomonas seen Performed at [facility], [address] [place], [place] [address]    Report Status 02/06/2024 FINAL  Final  Group A Strep by PCR     Status: None   Collection Time: 02/06/24 10:16 AM   Specimen: Throat; Sterile Swab  Result Value Ref Range Status   Group A Strep by PCR NOT DETECTED NOT DETECTED Final    Comment: Performed at [facility], [address] [place], [place] [address]    Procedures/Studies: DG Chest Port 1 View Result Date: 02/05/2024 CLINICAL DATA:  Shortness of breath. EXAM: PORTABLE CHEST 1 VIEW COMPARISON:  02/03/2024. FINDINGS: Low lung volume. Mild-to-moderate diffuse pulmonary vascular congestion, grossly similar to prior study and likely accentuated by low lung volume. Re-demonstration of left retrocardiac airspace opacity obscuring the left hemidiaphragm, descending thoracic aorta and blunting the left lateral costophrenic angle, suggesting combination of  left lung atelectasis and/or consolidation with pleural effusion. No significant interval change. There are patchy atelectatic changes at the right lung base, medially. Bilateral lung fields are otherwise clear. Right lateral costophrenic angle is clear. Stable cardio-mediastinal silhouette. No acute osseous abnormalities. The soft tissues are within normal limits. IMPRESSION: *No significant interval change since the prior study. Electronically Signed   By: Beula Brunswick M.D.   On: 02/05/2024 11:27   DG Chest Port 1 View Result Date: 02/03/2024 CLINICAL DATA:  Congestive heart failure. EXAM: PORTABLE CHEST 1 VIEW COMPARISON:  Feb 02, 2024. FINDINGS: Stable cardiomegaly with central pulmonary vascular congestion. Small left pleural effusion is noted. Minimal bibasilar atelectasis or edema is noted. Bony thorax is unremarkable. IMPRESSION: Stable cardiomegaly with central pulmonary vascular congestion. Small left pleural effusion. Minimal bibasilar atelectasis or edema.  Electronically Signed   By: Rosalene Colon M.D.   On: 02/03/2024 09:00   DG Chest 2 View Result Date: 02/02/2024 CLINICAL DATA:  Cough. EXAM: CHEST - 2 VIEW COMPARISON:  01/30/2024. FINDINGS: Mild diffuse prominence of interstitial markings is nonspecific and appears grossly similar to the prior study. Redemonstration of left basilar opacity, partially obscuring the left lateral hemidiaphragm and blunting the left lateral costophrenic angle, which may suggest combination of left lung lower lobe atelectasis and/or consolidation with pleural effusion. No significant interval change. Bilateral lungs are otherwise grossly clear. No acute consolidation or lung collapse. Right costophrenic angle is clear. Stable cardio-mediastinal silhouette. No acute osseous abnormalities. The soft tissues are within normal limits. IMPRESSION: *Redemonstration of left basilar opacity, which may represent combination of left lung lower lobe atelectasis and/or consolidation with pleural effusion. No significant interval change. Mild diffuse prominence of interstitial markings is nonspecific but grossly similar to the prior study. Electronically Signed   By: Beula Brunswick M.D.   On: 02/02/2024 09:07    Labs: BNP (last 3 results) Recent Labs    08/09/23 1024 08/31/23 1112 12/06/23 1532  BNP 194.9* 183.9* 249.9*   Basic Metabolic Panel: Recent Labs  Lab 02/02/24 1018 02/03/24 0812 02/04/24 0227 02/05/24 0204 02/06/24 0240 02/07/24 0212  NA  --  132* 134* 135 134* 134*  K  --  4.2 3.4* 3.7 3.7 3.6  CL  --  100 102 102 100 100  CO2  --  20* 19* 23 25 24   GLUCOSE  --  206* 139* 118* 137* 127*  BUN  --  34* 32* 27* 29* 29*  CREATININE  --  2.11* 1.98* 1.93* 1.81* 1.75*  CALCIUM   --  9.1 8.6* 8.6* 8.8* 8.7*  MG 1.9  --   --   --   --   --    Liver Function Tests: Recent Labs  Lab 02/02/24 0900  AST 29  ALT 17  ALKPHOS 69  BILITOT 2.4*  PROT 6.9  ALBUMIN  3.6   No results for input(s): "LIPASE",  "AMYLASE" in the last 168 hours. No results for input(s): "AMMONIA" in the last 168 hours. CBC: Recent Labs  Lab 02/02/24 0900 02/03/24 0812 02/04/24 1115 02/05/24 0204 02/06/24 0240 02/07/24 0212  WBC 2.9* 3.0* 4.0 3.1* 3.3* 2.8*  NEUTROABS 2.2  --   --   --   --   --   HGB 6.7* 7.3* 7.3* 7.4* 7.3* 7.2*  HCT 21.0* 22.8* 23.9* 23.5* 23.5* 23.2*  MCV 91.3 90.8 91.2 92.2 91.1 92.8  PLT 25* 30* 33* 28* 34* 29*   Cardiac Enzymes: No results for input(s): "CKTOTAL", "CKMB", "CKMBINDEX", "TROPONINI" in  the last 168 hours. BNP: Invalid input(s): "POCBNP" CBG: Recent Labs  Lab 02/06/24 1148 02/06/24 1559 02/06/24 2059 02/07/24 0612 02/07/24 1109  GLUCAP 157* 115* 174* 104* 135*   D-Dimer No results for input(s): "DDIMER" in the last 72 hours. Hgb A1c No results for input(s): "HGBA1C" in the last 72 hours. Lipid Profile No results for input(s): "CHOL", "HDL", "LDLCALC", "TRIG", "CHOLHDL", "LDLDIRECT" in the last 72 hours. Thyroid  function studies No results for input(s): "TSH", "T4TOTAL", "T3FREE", "THYROIDAB" in the last 72 hours.  Invalid input(s): "FREET3" Anemia work up No results for input(s): "VITAMINB12", "FOLATE", "FERRITIN", "TIBC", "IRON", "RETICCTPCT" in the last 72 hours. Urinalysis    Component Value Date/Time   COLORURINE AMBER (A) 07/04/2023 0609   APPEARANCEUR HAZY (A) 07/04/2023 0609   LABSPEC 1.013 07/04/2023 0609   PHURINE 5.0 07/04/2023 0609   GLUCOSEU NEGATIVE 07/04/2023 0609   HGBUR NEGATIVE 07/04/2023 0609   BILIRUBINUR NEGATIVE 07/04/2023 0609   KETONESUR NEGATIVE 07/04/2023 0609   PROTEINUR NEGATIVE 07/04/2023 0609   UROBILINOGEN 2.0 (H) 01/31/2015 0055   NITRITE NEGATIVE 07/04/2023 0609   LEUKOCYTESUR NEGATIVE 07/04/2023 0609   Sepsis Labs Recent Labs  Lab 02/04/24 1115 02/05/24 0204 02/06/24 0240 02/07/24 0212  WBC 4.0 3.1* 3.3* 2.8*   Microbiology Recent Results (from the past 240 hours)  Resp panel by RT-PCR (RSV, Flu A&B,  Covid) Anterior Nasal Swab     Status: None   Collection Time: 02/02/24  4:23 AM   Specimen: Anterior Nasal Swab  Result Value Ref Range Status   SARS Coronavirus 2 by RT PCR NEGATIVE NEGATIVE Final    Comment: (NOTE) SARS-CoV-2 target nucleic acids are NOT DETECTED.  The SARS-CoV-2 RNA is generally detectable in upper respiratory specimens during the acute phase of infection. The lowest concentration of SARS-CoV-2 viral copies this assay can detect is 138 copies/mL. A negative result does not preclude SARS-Cov-2 infection and should not be used as the sole basis for treatment or other patient management decisions. A negative result may occur with  improper specimen collection/handling, submission of specimen other than nasopharyngeal swab, presence of viral mutation(s) within the areas targeted by this assay, and inadequate number of viral copies(<138 copies/mL). A negative result must be combined with clinical observations, patient history, and epidemiological information. The expected result is Negative.  Fact Sheet for Patients:  BloggerCourse.com  Fact Sheet for Healthcare Providers:  SeriousBroker.it  This test is no t yet approved or cleared by the [place]  FDA and  has been authorized for detection and/or diagnosis of SARS-CoV-2 by FDA under an Emergency Use Authorization (EUA). This EUA will remain  in effect (meaning this test can be used) for the duration of the COVID-19 declaration under Section 564(b)(1) of the Act, 21 U.S.C.section 360bbb-3(b)(1), unless the authorization is terminated  or revoked sooner.       Influenza A by PCR NEGATIVE NEGATIVE Final   Influenza B by PCR NEGATIVE NEGATIVE Final    Comment: (NOTE) The Xpert Xpress SARS-CoV-2/FLU/RSV plus assay is intended as an aid in the diagnosis of influenza from Nasopharyngeal swab specimens and should not be used as a sole basis for treatment. Nasal  washings and aspirates are unacceptable for Xpert Xpress SARS-CoV-2/FLU/RSV testing.  Fact Sheet for Patients: BloggerCourse.com  Fact Sheet for Healthcare Providers: SeriousBroker.it  This test is not yet approved or cleared by the [place]  FDA and has been authorized for detection and/or diagnosis of SARS-CoV-2 by FDA under an Emergency Use Authorization (EUA). This EUA will  remain in effect (meaning this test can be used) for the duration of the COVID-19 declaration under Section 564(b)(1) of the Act, 21 U.S.C. section 360bbb-3(b)(1), unless the authorization is terminated or revoked.     Resp Syncytial Virus by PCR NEGATIVE NEGATIVE Final    Comment: (NOTE) Fact Sheet for Patients: BloggerCourse.com  Fact Sheet for Healthcare Providers: SeriousBroker.it  This test is not yet approved or cleared by the [place]  FDA and has been authorized for detection and/or diagnosis of SARS-CoV-2 by FDA under an Emergency Use Authorization (EUA). This EUA will remain in effect (meaning this test can be used) for the duration of the COVID-19 declaration under Section 564(b)(1) of the Act, 21 U.S.C. section 360bbb-3(b)(1), unless the authorization is terminated or revoked.  Performed at [facility], [address] [place], [place] [address]   Blood culture (routine x 2)     Status: None   Collection Time: 02/02/24 10:15 AM   Specimen: BLOOD  Result Value Ref Range Status   Specimen Description   Final    BLOOD RIGHT ANTECUBITAL Performed at [facility], [address] [place], [place] [address]    Special Requests   Final    BOTTLES DRAWN AEROBIC AND ANAEROBIC Blood Culture results may not be optimal due to an inadequate volume of blood received in culture bottles Performed at [facility], [place], [address] [place], [place] [address]     Culture   Final    NO GROWTH 5 DAYS Performed at [facility], [address] [place], [place] [address]    Report Status 02/07/2024 FINAL  Final  MRSA Next Gen by PCR, Nasal     Status: None   Collection Time: 02/02/24  3:47 PM   Specimen: Nasal Mucosa; Nasal Swab  Result Value Ref Range Status   MRSA by PCR Next Gen NOT DETECTED NOT DETECTED Final    Comment: (NOTE) The GeneXpert MRSA Assay (FDA approved for NASAL specimens only), is one component of a comprehensive MRSA colonization surveillance program. It is not intended to diagnose MRSA infection nor to guide or monitor treatment for MRSA infections. Test performance is not FDA approved in patients less than 76 years old. Performed at [facility], [address] [place], [place] [address]   Expectorated Sputum Assessment w Gram Stain, Rflx to Resp Cult     Status: None   Collection Time: 02/02/24  4:53 PM   Specimen: Expectorated Sputum  Result Value Ref Range Status   Specimen Description EXPECTORATED SPUTUM  Final   Special Requests NONE  Final   Sputum evaluation   Final    THIS SPECIMEN IS ACCEPTABLE FOR SPUTUM CULTURE Performed at [facility], [address], [place], [place] [address]    Report Status 02/03/2024 FINAL  Final  Culture, Respiratory w Gram Stain     Status: None   Collection Time: 02/02/24  4:53 PM  Result Value Ref Range Status   Specimen Description EXPECTORATED SPUTUM  Final   Special Requests NONE Reflexed from [id number]  Final   Gram Stain   Final    FEW SQUAMOUS EPITHELIAL CELLS PRESENT RARE GRAM POSITIVE COCCI RARE GRAM NEGATIVE RODS    Culture   Final    FEW Normal respiratory flora-no Staph aureus or Pseudomonas seen Performed at [facility], [address] [place], [place] [address]    Report Status 02/06/2024 FINAL  Final  Group A Strep by PCR     Status: None   Collection Time: 02/06/24 10:16 AM   Specimen: Throat; Sterile Swab  Result Value Ref Range  Status   Group A Strep by PCR NOT DETECTED NOT DETECTED Final    Comment: Performed at [facility], [address] [place], [place] [address]     Time coordinating discharge: 35 minutes  SIGNED: Lesa Rape, MD  [facility] 02/07/2024, 2:04 PM  If 7PM-7AM, please contact night-coverage [email or web address]

## 2024-02-07 NOTE — Progress Notes (Signed)
 Dr. Brock Canner paged with critical PLT   Latest Reference Range & Units 02/07/24 02:12  WBC 4.0 - 10.5 K/uL 2.8 (L)  RBC 3.87 - 5.11 MIL/uL 2.50 (L)  Hemoglobin 12.0 - 15.0 g/dL 7.2 (L)  HCT 16.1 - 09.6 % 23.2 (L)  MCV 80.0 - 100.0 fL 92.8  MCH 26.0 - 34.0 pg 28.8  MCHC 30.0 - 36.0 g/dL 04.5  RDW 40.9 - 81.1 % 15.5  Platelets 150 - 400 K/uL 29 (LL)  (LL): Data is critically low (L): Data is abnormally low

## 2024-02-07 NOTE — Progress Notes (Signed)
 Mobility Specialist Progress Note;   02/07/24 0911  Mobility  Activity Ambulated with assistance in hallway  Level of Assistance Standby assist, set-up cues, supervision of patient - no hands on  Assistive Device Four wheel walker  Distance Ambulated (ft) 115 ft  Activity Response Tolerated well  Mobility Referral Yes  Mobility visit 1 Mobility  Mobility Specialist Start Time (ACUTE ONLY) 0911  Mobility Specialist Stop Time (ACUTE ONLY) [id number]  Mobility Specialist Time Calculation (min) (ACUTE ONLY) 16 min   Pt agreeable to mobility. Required no physical assistance during ambulation, SV. Ambulated on RA, VSS throughout. No rest breaks taken this session, however pt c/o legs feeling fatigued once returned to room. Pt returned back to bed with all needs met, call bell in reach.   Janit Meline Mobility Specialist Please contact via SecureChat or [employer] [phone]

## 2024-02-07 NOTE — TOC Transition Note (Signed)
 Transition of Care [facility]) - Discharge Note   Patient Details  Name: Hannah Khan MRN: [id number] Date of Birth: 1956/01/11  Transition of Care [facility]) CM/SW Contact:  Jennett Model, RN Phone Number: 02/07/2024, 11:18 AM   Clinical Narrative:    For dc today, NCM spoke with patient over the phone, she states she would like to go to the [facility] outpatient rehab.  NCM made referral thru epic.           Patient Goals and CMS Choice            Discharge Placement                       Discharge Plan and Services Additional resources added to the After Visit Summary for                                       Social Drivers of Health (SDOH) Interventions SDOH Screenings   Food Insecurity: No Food Insecurity (02/02/2024)  Housing: Low Risk  (02/02/2024)  Transportation Needs: No Transportation Needs (02/02/2024)  Utilities: Not At Risk (02/02/2024)  Social Connections: Socially Integrated (02/02/2024)  Tobacco Use: Medium Risk (02/02/2024)     Readmission Risk Interventions    04/08/2023    9:42 AM 03/12/2023    1:47 PM  Readmission Risk Prevention Plan  Transportation Screening Complete Complete  PCP or Specialist Appt within 3-5 Days Complete   Home Care Screening  Complete  Medication Review (RN CM)  Complete  HRI or Home Care Consult Complete   Social Work Consult for Recovery Care Planning/Counseling Complete   Palliative Care Screening Not Applicable   Medication Review Oceanographer) Complete

## 2024-02-14 DIAGNOSIS — R5383 Other fatigue: Secondary | ICD-10-CM | POA: Diagnosis not present

## 2024-02-14 DIAGNOSIS — R051 Acute cough: Secondary | ICD-10-CM | POA: Diagnosis not present

## 2024-02-14 DIAGNOSIS — J0100 Acute maxillary sinusitis, unspecified: Secondary | ICD-10-CM | POA: Diagnosis not present

## 2024-02-21 DIAGNOSIS — I5032 Chronic diastolic (congestive) heart failure: Secondary | ICD-10-CM | POA: Diagnosis not present

## 2024-02-21 DIAGNOSIS — I4891 Unspecified atrial fibrillation: Secondary | ICD-10-CM | POA: Diagnosis not present

## 2024-02-21 DIAGNOSIS — I864 Gastric varices: Secondary | ICD-10-CM | POA: Diagnosis not present

## 2024-02-21 DIAGNOSIS — K7581 Nonalcoholic steatohepatitis (NASH): Secondary | ICD-10-CM | POA: Diagnosis not present

## 2024-02-21 DIAGNOSIS — K7682 Hepatic encephalopathy: Secondary | ICD-10-CM | POA: Diagnosis not present

## 2024-02-21 DIAGNOSIS — N184 Chronic kidney disease, stage 4 (severe): Secondary | ICD-10-CM | POA: Diagnosis not present

## 2024-02-21 DIAGNOSIS — I9589 Other hypotension: Secondary | ICD-10-CM | POA: Diagnosis not present

## 2024-02-22 DIAGNOSIS — K7581 Nonalcoholic steatohepatitis (NASH): Secondary | ICD-10-CM | POA: Diagnosis not present

## 2024-02-22 DIAGNOSIS — K7460 Unspecified cirrhosis of liver: Secondary | ICD-10-CM | POA: Diagnosis not present

## 2024-02-22 DIAGNOSIS — N184 Chronic kidney disease, stage 4 (severe): Secondary | ICD-10-CM | POA: Diagnosis not present

## 2024-02-26 DIAGNOSIS — G4733 Obstructive sleep apnea (adult) (pediatric): Secondary | ICD-10-CM | POA: Diagnosis not present

## 2024-03-03 ENCOUNTER — Other Ambulatory Visit (HOSPITAL_COMMUNITY): Payer: Self-pay | Admitting: Cardiology

## 2024-03-06 ENCOUNTER — Ambulatory Visit: Attending: Internal Medicine | Admitting: Physical Therapy

## 2024-03-06 ENCOUNTER — Ambulatory Visit: Admitting: Occupational Therapy

## 2024-03-06 ENCOUNTER — Other Ambulatory Visit: Payer: Self-pay

## 2024-03-06 DIAGNOSIS — R2681 Unsteadiness on feet: Secondary | ICD-10-CM

## 2024-03-06 DIAGNOSIS — R2689 Other abnormalities of gait and mobility: Secondary | ICD-10-CM | POA: Diagnosis not present

## 2024-03-06 DIAGNOSIS — M6281 Muscle weakness (generalized): Secondary | ICD-10-CM

## 2024-03-06 NOTE — Therapy (Signed)
 OUTPATIENT PHYSICAL THERAPY NEURO EVALUATION   Patient Name: Hannah Khan MRN: [id number] DOB:July 19, 1956, 68 y.o., female Today's Date: 03/07/2024   PCP: Regino Slater, MD REFERRING PROVIDER: Christobal Guadalajara, MD (Hospitalist)>f/u with Regino Slater, MD   END OF SESSION:  PT End of Session - 03/06/24 1412     Visit Number 1    Number of Visits 17    Date for PT Re-Evaluation 05/03/24    Authorization Type [name]    Progress Note Due on Visit 10    PT Start Time 1405    PT Stop Time 1445    PT Time Calculation (min) 40 min    Activity Tolerance Patient tolerated treatment well    Behavior During Therapy WFL for tasks assessed/performed          Past Medical History:  Diagnosis Date   Anemia    hx of   Aortic stenosis 02/2023   Echo 03/07/23 LVEF 55-60%, mild to moderate AS   Arthritis    knees   CHF (congestive heart failure) (HCC)    chronic diastolic   Diabetes mellitus without complication (HCC)    type 2   Dyspnea    Dysrhythmia    Fibromyalgia    Gastric varices    large   H/O transfusion of platelets    Heart murmur    never has caused any problems   Hx of colonic polyps    s/p partial colectomy   Hyperlipidemia    Hypertension    Joint pain    in knees and back spasms   Left leg swelling    wear compression hose   Liver cirrhosis secondary to NASH (nonalcoholic steatohepatitis) (HCC) dx nov 2014   had enlarged spleen also    Morbid obesity (HCC)    Persistent atrial fibrillation (HCC)    Pneumonia 10/2016   x 5   PONV (postoperative nausea and vomiting)    in past none recent   Portal hypertension (HCC)    Sleep apnea    uses cpap, setting varies between 14-20   Spleen enlarged    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 [facility])    Past Surgical History:  Procedure Laterality Date   BREAST LUMPECTOMY WITH RADIOACTIVE SEED LOCALIZATION Right 08/29/2017   Procedure: RIGHT BREAST LUMPECTOMY WITH RADIOACTIVE SEEDS LOCALIZATION;  Surgeon:  Vanderbilt Ned, MD;  Location: [facility] OR;  Service: General;  Laterality: Right;   CESAREAN SECTION  1989   CHOLECYSTECTOMY  20 yrs ago   COLECTOMY  2011   COLONOSCOPY     COLONOSCOPY N/A 09/05/2018   Procedure: COLONOSCOPY;  Surgeon: Burnette Fallow, MD;  Location: [facility] ENDOSCOPY;  Service: Endoscopy;  Laterality: N/A;   COLONOSCOPY WITH PROPOFOL  Bilateral 05/04/2022   Procedure: COLONOSCOPY WITH PROPOFOL ;  Surgeon: Burnette Fallow, MD;  Location: [facility] ENDOS[facility];  Service: Gastroenterology;  Laterality: Bilateral;   DILATATION & CURETTAGE/HYSTEROSCOPY WITH MYOSURE N/A 05/06/2016   Procedure: DILATATION & CURETTAGE/HYSTEROSCOPY WITH MYOSURE WITH POLYPECTOMY;  Surgeon: Delon Prude, DO;  Location: [facility] ORS;  Service: Gynecology;  Laterality: N/A;   ESOPHAGOGASTRODUODENOSCOPY (EGD) WITH PROPOFOL  N/A 09/04/2013   Procedure: ESOPHAGOGASTRODUODENOSCOPY (EGD) WITH PROPOFOL ;  Surgeon: Fallow Burnette, MD;  Location: [facility];  Service: Endoscopy;  Laterality: N/A;   ESOPHAGOGASTRODUODENOSCOPY (EGD) WITH PROPOFOL  Bilateral 05/04/2022   Procedure: ESOPHAGOGASTRODUODENOSCOPY (EGD) WITH PROPOFOL ;  Surgeon: Burnette Fallow, MD;  Location: [facility];  Service: Gastroenterology;  Laterality: Bilateral;   IR EMBO ART  VEN HEMORR LYMPH EXTRAV  INC GUIDE ROADMAPPING  06/30/2023  IR INTRAVASCULAR ULTRASOUND NON CORONARY  12/23/2022   IR RADIOLOGIST EVAL & MGMT  09/21/2022   IR RADIOLOGIST EVAL & MGMT  10/06/2022   IR RADIOLOGIST EVAL & MGMT  11/02/2022   IR RADIOLOGIST EVAL & MGMT  01/31/2023   IR RADIOLOGIST EVAL & MGMT  05/31/2023   IR RADIOLOGIST EVAL & MGMT  08/21/2023   IR RADIOLOGIST EVAL & MGMT  12/25/2023   IR TIPS  12/23/2022   IR TIPS  06/30/2023   IR US  GUIDE VASC ACCESS LEFT  06/30/2023   IR US  GUIDE VASC ACCESS RIGHT  12/23/2022   IR US  GUIDE VASC ACCESS RIGHT  06/30/2023   IR US  GUIDE VASC ACCESS RIGHT  06/30/2023   KNEE ARTHROSCOPY  yrs ago   bilateral, one done x 1, one done twice   POLYPECTOMY   09/05/2018   Procedure: POLYPECTOMY;  Surgeon: Burnette Fallow, MD;  Location: [facility];  Service: Endoscopy;;   RADIOLOGY WITH ANESTHESIA N/A 12/23/2022   Procedure: TIPS;  Surgeon: Jennefer Ester PARAS, MD;  Location: [facility] OR;  Service: Radiology;  Laterality: N/A;   RIGHT HEART CATH N/A 05/19/2023   Procedure: RIGHT HEART CATH;  Surgeon: Gardenia Led, DO;  Location: [facility] INVASIVE CV LAB;  Service: Cardiovascular;  Laterality: N/A;   TIPS PROCEDURE N/A 06/30/2023   Procedure: TRANS-JUGULAR INTRAHEPATIC PORTAL SHUNT (TIPS);  Surgeon: Jennefer Ester PARAS, MD;  Location: [facility] OR;  Service: Radiology;  Laterality: N/A;   Patient Active Problem List   Diagnosis Date Noted   CHF (congestive heart failure) (HCC) 02/02/2024   Hypoxia 07/04/2023   Nausea & vomiting 07/04/2023   Nausea and vomiting 07/03/2023   Intractable nausea and vomiting 04/07/2023   Intractable vomiting with nausea 04/06/2023   Acute on chronic diastolic heart failure (HCC) 03/06/2023   Chronic hypotension 03/06/2023   Sepsis due to cellulitis (HCC) 03/05/2023   Severe sepsis (HCC) 03/05/2023   Portal hypertension (HCC) 12/23/2022   Acute encephalopathy 01/10/2021   Secondary hypercoagulable state (HCC) 06/23/2020   Streptococcal bacteremia 03/16/2020   Chronic venous stasis dermatitis of both lower extremities 03/16/2020   Atrial fibrillation with RVR (HCC) 04/26/2018   Acute lower UTI 04/26/2018   Liver cirrhosis secondary to NASH (HCC) 04/26/2018   Hyperlipidemia 04/26/2018   Type 2 diabetes mellitus (HCC)    Cellulitis of right lower extremity 03/15/2018   Sepsis (HCC) 03/14/2018   Community acquired pneumonia of right lower lobe of lung 10/31/2016   Muscular abdominal pain in right upper quadrant 07/11/2016   Chronic heart failure with preserved ejection fraction (HFpEF) (HCC) 07/10/2016   Thrombocytopenia (HCC) 07/10/2016   Permanent atrial fibrillation (HCC) 01/30/2016   Type 2 diabetes mellitus without  complication, without long-term current use of insulin  (HCC) 01/30/2016   Morbid obesity- 12/10/2015   Cellulitis, abdominal wall    Melena 01/31/2015   Aortic heart murmur 01/31/2015   Anemia 07/23/2013   Obstructive sleep apnea 08/07/2007   Essential hypertension 08/07/2007   Allergic rhinitis 08/07/2007    ONSET DATE: 02/07/2024 (MD referral)  REFERRING DIAG: Repeated falls (R29.6);Muscle weakness (generalized) (M62.81);History of falling (Z91.81)  THERAPY DIAG:  Muscle weakness (generalized)  Unsteadiness on feet  Other abnormalities of gait and mobility  Rationale for Evaluation and Treatment: Rehabilitation  SUBJECTIVE:  SUBJECTIVE STATEMENT: Had some type of crud that I caught from my husband.  Had to be hospitalized, Hgb was low, but is going up (was 9 at last check).  My muscles are weak, but I am not falling (have not fallen).  I'm moving around a bit in the house, some without the walker. Pt accompanied by: self  PERTINENT HISTORY: ED visit with pneumonia and anemia and hospitalized 02/02/24-02/07/24; PMH of NASH Liver cirrhosis with gastro-esophageal varices, A fib , pancytopenia, CKD 3B b/l bun/creat 21/1.6,severe morbid obesity, A-fib not on anticoagulation, diabetes, hypertension presented to ED 02/02/24 with shortness of breath and upper respiratory symptoms x 1 week  PAIN:  Are you having pain? Yes: NPRS scale: 4/10 Pain location: knees, back Pain description: soreness Aggravating factors: increased chores, standing Relieving factors: rest  PRECAUTIONS: Fall  RED FLAGS: None   WEIGHT BEARING RESTRICTIONS: No  FALLS: Has patient fallen in last 6 months? No  LIVING ENVIRONMENT: Lives with: lives with their spouse Lives in: House/apartment Stairs: 1 step into garage Has  following equipment at home: Single point cane and Environmental consultant - 4 wheeled  PLOF: Independent with household mobility with device and Independent with community mobility with device  PATIENT GOALS: Want to be more steady on balance; would like to be able to just use cane  OBJECTIVE:  Note: Objective measures were completed at Evaluation unless otherwise noted.  DIAGNOSTIC FINDINGS: NA for this episode  COGNITION: Overall cognitive status: Within functional limits for tasks assessed   SENSATION: Light touch: WFL  COORDINATION: Alt toe tapping WFL   POSTURE: rounded shoulders and posterior pelvic tilt  LOWER EXTREMITY ROM:   WFL  Active  Right Eval Left Eval  Hip flexion    Hip extension    Hip abduction    Hip adduction    Hip internal rotation    Hip external rotation    Knee flexion    Knee extension    Ankle dorsiflexion    Ankle plantarflexion    Ankle inversion    Ankle eversion     (Blank rows = not tested)  LOWER EXTREMITY MMT:    MMT Right Eval Left Eval  Hip flexion 4 4  Hip extension    Hip abduction    Hip adduction    Hip internal rotation    Hip external rotation    Knee flexion 4+ 4+  Knee extension 4+ 4+  Ankle dorsiflexion    Ankle plantarflexion    Ankle inversion    Ankle eversion    (Blank rows = not tested)  TRANSFERS: Sit to stand: Modified independence  Assistive device utilized: None     Stand to sit: Modified independence  Assistive device utilized: None       GAIT: Findings: Gait Characteristics: step through pattern, trendelenburg, and wide BOS, Distance walked: 50 ft x 2, Assistive device utilized:Single point cane and Walker - 2 wheeled, Level of assistance: CGA, and Comments: Increased unsteadiness with cane  FUNCTIONAL TESTS:  5 times sit to stand: 15.62 sec Timed up and go (TUG): NT 10 meter walk test: 13.19 sec with rollator (2.49 ft/sec); 16.69 sec with cane (1.97 ft/sec) Berg:  33/56 Pre-test vitals:  98%, 91  HR  TREATMENT DATE: 03/06/2024    PATIENT EDUCATION: Education details: Eval results, POC Person educated: Patient Education method: Explanation Education comprehension: verbalized understanding  HOME EXERCISE PROGRAM: Not yet initiated  GOALS: Goals reviewed with patient? Yes  SHORT TERM GOALS: Target date: 04/05/2024  Pt will be independent with HEP for improved balance, strength, gait. Baseline: Goal status: INITIAL  2.  Pt will improve 5x sit<>stand to less than or equal to 12 sec to demonstrate improved functional strength and transfer efficiency. Baseline: 15.62 sec Goal status: INITIAL  3.  Pt will improve Berg score to at least 41/56 to decrease fall risk. Baseline: 33/56 Goal status: INITIAL   LONG TERM GOALS: Target date: 05/03/2024  Pt will be independent with HEP for improved balance, strength, gait. Baseline:  Goal status: INITIAL  2.  Pt will improve gait velocity to at least 2.3 ft/sec with cane for improved gait efficiency and safety. Baseline: 1.97 ft/sec with cane Goal status: INITIAL  3.  Pt will improve Berg score to at least 48/56 to decrease fall risk. Baseline: 33/56 Goal status: INITIAL  4.  Pt will verbalize understanding of fall prevention in home environment.  Baseline:  Goal status: INITIAL  ASSESSMENT:  CLINICAL IMPRESSION: Patient is a 68 y.o. female who was seen today for physical therapy evaluation and treatment for repeated falls, muscle weakness. (*Pt's dx states repeated falls, but please note, patient does report she has had NO falls.*)  Pt presents today after being hospitalized for respiratory infection; she has extensive medical history and is known to this therapist from previous bout of therapy.  She presents with decreased strength, decreased balance, decreased endurance, decreased independence with  gait.  She is interested in progressing from rollator to cane for gait; currently she is at increased fall risk per Lars and FTSTS scores and has slower gait velocity and increased unsteadiness with gait with cane.  She would benefit from skilled PT to address the above stated deficits to decrease fall risk and improve overall functional mobility.  OBJECTIVE IMPAIRMENTS: Abnormal gait, decreased activity tolerance, decreased balance, decreased endurance, decreased knowledge of use of DME, decreased mobility, difficulty walking, decreased strength, and postural dysfunction.   ACTIVITY LIMITATIONS: standing, transfers, and locomotion level  PARTICIPATION LIMITATIONS: meal prep, cleaning, laundry, shopping, and community activity  PERSONAL FACTORS: 3+ comorbidities: See PMH above are also affecting patient's functional outcome.   REHAB POTENTIAL: Good  CLINICAL DECISION MAKING: Evolving/moderate complexity  EVALUATION COMPLEXITY: Moderate  PLAN:  PT FREQUENCY: 1-2x/week  PT DURATION: 8 weeks plus eval  PLANNED INTERVENTIONS: 97750- Physical Performance Testing, 97110-Therapeutic exercises, 97530- Therapeutic activity, 97112- Neuromuscular re-education, 97535- Self Care, 02859- Manual therapy, [id number]- Gait training, Patient/Family education, and Balance training  PLAN FOR NEXT SESSION: Initiate HEP for balance, progress lower extremity strengthening   Sanjit Mcmichael W., PT 03/07/2024, 12:40 PM  [facility] [address], [address] [place], [place] [address] Phone # [phone] Fax # [phone]  Referring diagnosis? Repeated falls (R29.6);Muscle weakness (generalized) (M62.81);History of falling (Z91.81)  Treatment diagnosis? (if different than referring diagnosis) R 26.81, R26.89, M62.81 What was this (referring dx) caused by? []  Surgery []  Fall [x]  Ongoing issue []  Arthritis []  Other: ____________  Laterality: []  Rt []  Lt [x]   Both  Check all possible CPT codes:  *CHOOSE 10 OR LESS*    See Planned Interventions listed in the Plan section of the Evaluation.

## 2024-03-06 NOTE — Therapy (Signed)
 OUTPATIENT OCCUPATIONAL THERAPY NEURO EVALUATION  Patient Name: Hannah Khan MRN: [id number] DOB:1955-12-20, 68 y.o., female Today's Date: 03/06/2024  PCP: Lanae Pinal, MD REFERRING PROVIDER: Lesa Rape, MD  END OF SESSION:  OT End of Session - 03/06/24 1543     Visit Number 1    Number of Visits 1    Authorization Type [name]    OT Start Time 1450    OT Stop Time 1532    OT Time Calculation (min) 42 min          Past Medical History:  Diagnosis Date   Anemia    hx of   Aortic stenosis 02/2023   Echo 03/07/23 LVEF 55-60%, mild to moderate AS   Arthritis    knees   CHF (congestive heart failure) (HCC)    chronic diastolic   Diabetes mellitus without complication (HCC)    type 2   Dyspnea    Dysrhythmia    Fibromyalgia    Gastric varices    large   H/O transfusion of platelets    Heart murmur    never has caused any problems   Hx of colonic polyps    s/p partial colectomy   Hyperlipidemia    Hypertension    Joint pain    in knees and back spasms   Left leg swelling    wear compression hose   Liver cirrhosis secondary to NASH (nonalcoholic steatohepatitis) (HCC) dx nov 2014   had enlarged spleen also    Morbid obesity (HCC)    Persistent atrial fibrillation (HCC)    Pneumonia 10/2016   x 5   PONV (postoperative nausea and vomiting)    in past none recent   Portal hypertension (HCC)    Sleep apnea    uses cpap, setting varies between 14-20   Spleen enlarged    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 [facility])    Past Surgical History:  Procedure Laterality Date   BREAST LUMPECTOMY WITH RADIOACTIVE SEED LOCALIZATION Right 08/29/2017   Procedure: RIGHT BREAST LUMPECTOMY WITH RADIOACTIVE SEEDS LOCALIZATION;  Surgeon: Sim Dryer, MD;  Location: [facility] OR;  Service: General;  Laterality: Right;   CESAREAN SECTION  1989   CHOLECYSTECTOMY  20 yrs ago   COLECTOMY  2011   COLONOSCOPY     COLONOSCOPY N/A 09/05/2018   Procedure: COLONOSCOPY;   Surgeon: Evangeline Hilts, MD;  Location: [facility];  Service: Endoscopy;  Laterality: N/A;   COLONOSCOPY WITH PROPOFOL  Bilateral 05/04/2022   Procedure: COLONOSCOPY WITH PROPOFOL ;  Surgeon: Evangeline Hilts, MD;  Location: [facility];  Service: Gastroenterology;  Laterality: Bilateral;   DILATATION & CURETTAGE/HYSTEROSCOPY WITH MYOSURE N/A 05/06/2016   Procedure: DILATATION & CURETTAGE/HYSTEROSCOPY WITH MYOSURE WITH POLYPECTOMY;  Surgeon: Keene Pastures, DO;  Location: [facility];  Service: Gynecology;  Laterality: N/A;   ESOPHAGOGASTRODUODENOSCOPY (EGD) WITH PROPOFOL  N/A 09/04/2013   Procedure: ESOPHAGOGASTRODUODENOSCOPY (EGD) WITH PROPOFOL ;  Surgeon: Evangeline Hilts, MD;  Location: [facility];  Service: Endoscopy;  Laterality: N/A;   ESOPHAGOGASTRODUODENOSCOPY (EGD) WITH PROPOFOL  Bilateral 05/04/2022   Procedure: ESOPHAGOGASTRODUODENOSCOPY (EGD) WITH PROPOFOL ;  Surgeon: Evangeline Hilts, MD;  Location: [facility];  Service: Gastroenterology;  Laterality: Bilateral;   IR EMBO ART  VEN HEMORR LYMPH EXTRAV  INC GUIDE ROADMAPPING  06/30/2023   IR INTRAVASCULAR ULTRASOUND NON CORONARY  12/23/2022   IR RADIOLOGIST EVAL & MGMT  09/21/2022   IR RADIOLOGIST EVAL & MGMT  10/06/2022   IR RADIOLOGIST EVAL & MGMT  11/02/2022   IR RADIOLOGIST EVAL & MGMT  01/31/2023  IR RADIOLOGIST EVAL & MGMT  05/31/2023   IR RADIOLOGIST EVAL & MGMT  08/21/2023   IR RADIOLOGIST EVAL & MGMT  12/25/2023   IR TIPS  12/23/2022   IR TIPS  06/30/2023   IR US  GUIDE VASC ACCESS LEFT  06/30/2023   IR US  GUIDE VASC ACCESS RIGHT  12/23/2022   IR US  GUIDE VASC ACCESS RIGHT  06/30/2023   IR US  GUIDE VASC ACCESS RIGHT  06/30/2023   KNEE ARTHROSCOPY  yrs ago   bilateral, one done x 1, one done twice   POLYPECTOMY  09/05/2018   Procedure: POLYPECTOMY;  Surgeon: Evangeline Hilts, MD;  Location: [facility];  Service: Endoscopy;;   RADIOLOGY WITH ANESTHESIA N/A 12/23/2022   Procedure: TIPS;  Surgeon: Federico Hopkins, MD;  Location: [facility] OR;  Service:  Radiology;  Laterality: N/A;   RIGHT HEART CATH N/A 05/19/2023   Procedure: RIGHT HEART CATH;  Surgeon: Alwin Baars, DO;  Location: [facility];  Service: Cardiovascular;  Laterality: N/A;   TIPS PROCEDURE N/A 06/30/2023   Procedure: TRANS-JUGULAR INTRAHEPATIC PORTAL SHUNT (TIPS);  Surgeon: Federico Hopkins, MD;  Location: [facility] OR;  Service: Radiology;  Laterality: N/A;   Patient Active Problem List   Diagnosis Date Noted   CHF (congestive heart failure) (HCC) 02/02/2024   Hypoxia 07/04/2023   Nausea & vomiting 07/04/2023   Nausea and vomiting 07/03/2023   Intractable nausea and vomiting 04/07/2023   Intractable vomiting with nausea 04/06/2023   Acute on chronic diastolic heart failure (HCC) 03/06/2023   Chronic hypotension 03/06/2023   Sepsis due to cellulitis (HCC) 03/05/2023   Severe sepsis (HCC) 03/05/2023   Portal hypertension (HCC) 12/23/2022   Acute encephalopathy 01/10/2021   Secondary hypercoagulable state (HCC) 06/23/2020   Streptococcal bacteremia 03/16/2020   Chronic venous stasis dermatitis of both lower extremities 03/16/2020   Atrial fibrillation with RVR (HCC) 04/26/2018   Acute lower UTI 04/26/2018   Liver cirrhosis secondary to NASH (HCC) 04/26/2018   Hyperlipidemia 04/26/2018   Type 2 diabetes mellitus (HCC)    Cellulitis of right lower extremity 03/15/2018   Sepsis (HCC) 03/14/2018   Community acquired pneumonia of right lower lobe of lung 10/31/2016   Muscular abdominal pain in right upper quadrant 07/11/2016   Chronic heart failure with preserved ejection fraction (HFpEF) (HCC) 07/10/2016   Thrombocytopenia (HCC) 07/10/2016   Permanent atrial fibrillation (HCC) 01/30/2016   Type 2 diabetes mellitus without complication, without long-term current use of insulin  (HCC) 01/30/2016   Morbid obesity- 12/10/2015   Cellulitis, abdominal wall    Melena 01/31/2015   Aortic heart murmur 01/31/2015   Anemia 07/23/2013   Obstructive sleep apnea 08/07/2007    Essential hypertension 08/07/2007   Allergic rhinitis 08/07/2007    ONSET DATE: referral date 02/07/24  REFERRING DIAG: Repeated falls (R29.6);Muscle weakness (generalized) (M62.81);History of falling (Z91.81)  THERAPY DIAG:  Muscle weakness (generalized)  Unsteadiness on feet  Rationale for Evaluation and Treatment: Rehabilitation  SUBJECTIVE:   SUBJECTIVE STATEMENT: Pt reports extensive medical history impacting her ADLs. 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  Pt reports in 2022 going into organ failure, recovering to be able to go on a cruise 6 months later.  Pt reports that she is very determined but also has a husband who will do anything for me.  Pt reports that she has returned to doing the laundry in the last week, she has cooked  a couple meals and washed dishes over the last couple of weeks.  Pt reports that she was able to clean the bathroom from top to bottom yesterday. Pt accompanied by: self and family member (husband brought pt)  PERTINENT HISTORY: presented to ED on 5/16 with SOB and URI x1 week, admitted for CHF exacerbation. PMH includes NASH liver cirrhosis, gastro-esophageal varices, A fib, pantocytopenia, CKD IIIB, obesity, A fib not on anticoagulation, DM, HTN  PRECAUTIONS: Other: no lifting >5# with RUE  WEIGHT BEARING RESTRICTIONS: limited RUE ROM and no lifting >5#  PAIN:  Are you having pain? Yes: NPRS scale: 5/10 Pain location: neck and R shoulder Pain description: ache Aggravating factors: certain movements Relieving factors: rest  FALLS: Has patient fallen in last 6 months? No  LIVING ENVIRONMENT: Lives with: lives with their spouse Lives in: House/apartment Stairs: external: 2 steps at front entrance, 1 from back yard, 1 in garage; one level home Has following equipment at home: Environmental consultant - 2 wheeled, Environmental consultant - 4 wheeled, Shower bench, bed side commode, and hand  held shower head  PLOF: Independent with basic ADLs, Independent with household mobility with device, and Requires assistive device for independence  PATIENT GOALS: to be more independent  OBJECTIVE:  Note: Objective measures were completed at Evaluation unless otherwise noted.  HAND DOMINANCE: Right  ADLs: Transfers/ambulation related to ADLs: uses Rollator in the community, walking short distances - to the bathroom, etc - without it Eating: Mod I Grooming: Mod I UB Dressing: Mod I LB Dressing: Mod I, to include compression stockings, wears Skechers slip ons Toileting: using toilet aid for hygiene Bathing: Mod I, husband washes back Tub Shower transfers: Mod I with tub bench Equipment: Transfer tub bench, bed side commode, Reacher, and toilet aid  IADLs: Shopping: uses electric cart due to balance issues Light housekeeping: Pt reports that she has returned to doing the laundry in the last week, she has washed dishes over the last couple of weeks.  Pt reports that she was able to clean the bathroom from top to bottom yesterday. Meal Prep: she has cooked a couple meals over the last couple of weeks Community mobility: is driving sometimes Medication management: Independent  MOBILITY STATUS: Needs Assist: utilizes Rollator in the community and intermittently in the home for balance and decreased endurance  POSTURE COMMENTS:  rounded shoulders and forward head  ACTIVITY TOLERANCE: Activity tolerance: improving, pt reports cleaning bathroom from top to bottom yesterday  UPPER EXTREMITY ROM:    Active ROM Right eval Left eval  Shoulder flexion 105 WFL throughout  Shoulder abduction    Shoulder adduction    Shoulder extension    Shoulder internal rotation 60-70%   Shoulder external rotation 90%   Elbow flexion    Elbow extension    Wrist flexion    Wrist extension    Wrist ulnar deviation    Wrist radial deviation    Wrist pronation    Wrist supination    (Blank  rows = not tested)   COGNITION: Overall cognitive status: Within functional limits for tasks assessed  VISION: Subjective report: ongoing vision issues, receiving shots in left eye for macular degeneration Baseline vision: Wears glasses for reading only and magnifying glass to manage medication Visual history: macular degeneration  TREATMENT DATE:  03/06/24 Energy conservation: educated on 4P's of energy conservation (planning, prioritizing, pacing, and positioning) and providing cues and examples for each.  OT also providing additional energy conservation handout from OT toolkit to provide further recommendations and modifications to routines to increase safety and tolerance.    PATIENT EDUCATION: Education details: Educated on role and purpose of OT as well as potential interventions and goals for therapy based on initial evaluation findings. Person educated: Patient Education method: Chief Technology Officer Education comprehension: verbalized understanding  HOME EXERCISE PROGRAM: N/A   GOALS: Goals reviewed with patient? Yes  SHORT TERM GOALS: Target date: 03/06/24  Pt will verbalize understanding of energy conservation strategies.   Baseline: Goal status: MET   ASSESSMENT:  CLINICAL IMPRESSION: Patient is a 68 y.o. female who was seen today for occupational therapy evaluation for weakness s/p hospitalization for CHF exacerbation.  Pt reports making steady improvements since d/c from hospital, returning to ADLs at Mod I level and even beginning to resume IADLs.  Pt continues to report decreased balance and endurance, but able to perform ADLs and IADLs with good awareness and use of AE/DME for improved safety and independence.  Pt educated on energy conservation strategies and provided with handouts.  Pt pleased with current education and plans to  continue to work with PT on balance and endurance.  Pt will not require any additional OT services at this time.    PERFORMANCE DEFICITS: in functional skills including IADLs, balance, and endurance and psychosocial skills including routines and behaviors.   IMPAIRMENTS: are limiting patient from IADLs.   CO-MORBIDITIES: may have co-morbidities  that affects occupational performance. Patient will benefit from skilled OT to address above impairments and improve overall function.  MODIFICATION OR ASSISTANCE TO COMPLETE EVALUATION: No modification of tasks or assist necessary to complete an evaluation.  OT OCCUPATIONAL PROFILE AND HISTORY: Problem focused assessment: Including review of records relating to presenting problem.  CLINICAL DECISION MAKING: LOW - limited treatment options, no task modification necessary  REHAB POTENTIAL: Excellent  EVALUATION COMPLEXITY: Low    PLAN:  OT FREQUENCY: one time visit  OT DURATION: other: one time visit  PLANNED INTERVENTIONS: [address]7535 self care/ADL training, energy conservation, patient/family education, and DME and/or AE instructions  RECOMMENDED OTHER SERVICES: NA  CONSULTED AND AGREED WITH PLAN OF CARE: Patient   Referring diagnosis? Repeated falls (R29.6);Muscle weakness (generalized) (M62.81);History of falling (Z91.81) Treatment diagnosis? (if different than referring diagnosis)  Muscle weakness (generalized)  Unsteadiness on feet What was this (referring dx) caused by? []  Surgery []  Fall []  Ongoing issue []  Arthritis [x]  Other: __CHF exacerbation __________  Laterality: [x]  Rt []  Lt []  Both  Check all possible CPT codes:  *CHOOSE 10 OR LESS*    See Planned Interventions listed in the Plan section of the Evaluation.    Anthonette Kinsman, OTR/L 03/06/2024, 3:45 PM  [facility] at [facility] [address] [place], [address] [place], [place] [address] Phone # [phone] Fax # [phone]

## 2024-03-11 NOTE — Therapy (Signed)
 OUTPATIENT PHYSICAL THERAPY NEURO TREATMENT   Patient Name: Hannah Khan MRN: [id number] DOB:[birth year], 68 y.o., female Today's Date: 03/12/2024   PCP: Regino Slater, MD REFERRING PROVIDER: Christobal Guadalajara, MD (Hospitalist)>f/u with Regino Slater, MD   END OF SESSION:  PT End of Session - 03/12/24 9073     Visit Number 2    Number of Visits 17    Date for PT Re-Evaluation 05/03/24    Authorization Type [name]    Authorization Time Period approved 17 PT visits from 03/06/2024-05/03/2024    Authorization - Visit Number 2    Authorization - Number of Visits 17    Progress Note Due on Visit 10    PT Start Time 0843    PT Stop Time 0929    PT Time Calculation (min) 46 min    Activity Tolerance Patient tolerated treatment well    Behavior During Therapy [facility] for tasks assessed/performed           Past Medical History:  Diagnosis Date   Anemia    hx of   Aortic stenosis 02/2023   Echo 03/07/23 LVEF 55-60%, mild to moderate AS   Arthritis    knees   CHF (congestive heart failure) (HCC)    chronic diastolic   Diabetes mellitus without complication (HCC)    type 2   Dyspnea    Dysrhythmia    Fibromyalgia    Gastric varices    large   H/O transfusion of platelets    Heart murmur    never has caused any problems   Hx of colonic polyps    s/p partial colectomy   Hyperlipidemia    Hypertension    Joint pain    in knees and back spasms   Left leg swelling    wear compression hose   Liver cirrhosis secondary to NASH (nonalcoholic steatohepatitis) (HCC) dx nov 2014   had enlarged spleen also    Morbid obesity (HCC)    Persistent atrial fibrillation (HCC)    Pneumonia 10/2016   x 5   PONV (postoperative nausea and vomiting)    in past none recent   Portal hypertension (HCC)    Sleep apnea    uses cpap, setting varies between 14-20   Spleen enlarged    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 [facility])    Past Surgical History:  Procedure Laterality Date    BREAST LUMPECTOMY WITH RADIOACTIVE SEED LOCALIZATION Right 08/29/2017   Procedure: RIGHT BREAST LUMPECTOMY WITH RADIOACTIVE SEEDS LOCALIZATION;  Surgeon: Vanderbilt Ned, MD;  Location: [facility] OR;  Service: General;  Laterality: Right;   CESAREAN SECTION  1989   CHOLECYSTECTOMY  20 yrs ago   COLECTOMY  2011   COLONOSCOPY     COLONOSCOPY N/A 09/05/2018   Procedure: COLONOSCOPY;  Surgeon: Burnette Fallow, MD;  Location: [facility];  Service: Endoscopy;  Laterality: N/A;   COLONOSCOPY WITH PROPOFOL  Bilateral 05/04/2022   Procedure: COLONOSCOPY WITH PROPOFOL ;  Surgeon: Burnette Fallow, MD;  Location: [facility];  Service: Gastroenterology;  Laterality: Bilateral;   DILATATION & CURETTAGE/HYSTEROSCOPY WITH MYOSURE N/A 05/06/2016   Procedure: DILATATION & CURETTAGE/HYSTEROSCOPY WITH MYOSURE WITH POLYPECTOMY;  Surgeon: Delon Prude, DO;  Location: [facility];  Service: Gynecology;  Laterality: N/A;   ESOPHAGOGASTRODUODENOSCOPY (EGD) WITH PROPOFOL  N/A 09/04/2013   Procedure: ESOPHAGOGASTRODUODENOSCOPY (EGD) WITH PROPOFOL ;  Surgeon: Fallow Burnette, MD;  Location: [facility];  Service: Endoscopy;  Laterality: N/A;   ESOPHAGOGASTRODUODENOSCOPY (EGD) WITH PROPOFOL  Bilateral 05/04/2022   Procedure: ESOPHAGOGASTRODUODENOSCOPY (EGD) WITH PROPOFOL ;  Surgeon: Burnette,  Elsie, MD;  Location: [facility];  Service: Gastroenterology;  Laterality: Bilateral;   IR EMBO ART  VEN HEMORR LYMPH EXTRAV  INC GUIDE ROADMAPPING  06/30/2023   IR INTRAVASCULAR ULTRASOUND NON CORONARY  12/23/2022   IR RADIOLOGIST EVAL & MGMT  09/21/2022   IR RADIOLOGIST EVAL & MGMT  10/06/2022   IR RADIOLOGIST EVAL & MGMT  11/02/2022   IR RADIOLOGIST EVAL & MGMT  01/31/2023   IR RADIOLOGIST EVAL & MGMT  05/31/2023   IR RADIOLOGIST EVAL & MGMT  08/21/2023   IR RADIOLOGIST EVAL & MGMT  12/25/2023   IR TIPS  12/23/2022   IR TIPS  06/30/2023   IR US  GUIDE VASC ACCESS LEFT  06/30/2023   IR US  GUIDE VASC ACCESS RIGHT  12/23/2022   IR US  GUIDE VASC ACCESS RIGHT   06/30/2023   IR US  GUIDE VASC ACCESS RIGHT  06/30/2023   KNEE ARTHROSCOPY  yrs ago   bilateral, one done x 1, one done twice   POLYPECTOMY  09/05/2018   Procedure: POLYPECTOMY;  Surgeon: Burnette Elsie, MD;  Location: [facility];  Service: Endoscopy;;   RADIOLOGY WITH ANESTHESIA N/A 12/23/2022   Procedure: TIPS;  Surgeon: Jennefer Ester PARAS, MD;  Location: [facility] OR;  Service: Radiology;  Laterality: N/A;   RIGHT HEART CATH N/A 05/19/2023   Procedure: RIGHT HEART CATH;  Surgeon: Gardenia Led, DO;  Location: [facility];  Service: Cardiovascular;  Laterality: N/A;   TIPS PROCEDURE N/A 06/30/2023   Procedure: TRANS-JUGULAR INTRAHEPATIC PORTAL SHUNT (TIPS);  Surgeon: Jennefer Ester PARAS, MD;  Location: [facility] OR;  Service: Radiology;  Laterality: N/A;   Patient Active Problem List   Diagnosis Date Noted   CHF (congestive heart failure) (HCC) 02/02/2024   Hypoxia 07/04/2023   Nausea & vomiting 07/04/2023   Nausea and vomiting 07/03/2023   Intractable nausea and vomiting 04/07/2023   Intractable vomiting with nausea 04/06/2023   Acute on chronic diastolic heart failure (HCC) 03/06/2023   Chronic hypotension 03/06/2023   Sepsis due to cellulitis (HCC) 03/05/2023   Severe sepsis (HCC) 03/05/2023   Portal hypertension (HCC) 12/23/2022   Acute encephalopathy 01/10/2021   Secondary hypercoagulable state (HCC) 06/23/2020   Streptococcal bacteremia 03/16/2020   Chronic venous stasis dermatitis of both lower extremities 03/16/2020   Atrial fibrillation with RVR (HCC) 04/26/2018   Acute lower UTI 04/26/2018   Liver cirrhosis secondary to NASH (HCC) 04/26/2018   Hyperlipidemia 04/26/2018   Type 2 diabetes mellitus (HCC)    Cellulitis of right lower extremity 03/15/2018   Sepsis (HCC) 03/14/2018   Community acquired pneumonia of right lower lobe of lung 10/31/2016   Muscular abdominal pain in right upper quadrant 07/11/2016   Chronic heart failure with preserved ejection fraction (HFpEF) (HCC)  07/10/2016   Thrombocytopenia (HCC) 07/10/2016   Permanent atrial fibrillation (HCC) 01/30/2016   Type 2 diabetes mellitus without complication, without long-term current use of insulin  (HCC) 01/30/2016   Morbid obesity- 12/10/2015   Cellulitis, abdominal wall    Melena 01/31/2015   Aortic heart murmur 01/31/2015   Anemia 07/23/2013   Obstructive sleep apnea 08/07/2007   Essential hypertension 08/07/2007   Allergic rhinitis 08/07/2007    ONSET DATE: 02/07/2024 (MD referral)  REFERRING DIAG: Repeated falls (R29.6);Muscle weakness (generalized) (M62.81);History of falling (Z91.81)  THERAPY DIAG:  Muscle weakness (generalized)  Unsteadiness on feet  Other abnormalities of gait and mobility  Rationale for Evaluation and Treatment: Rehabilitation  SUBJECTIVE:  SUBJECTIVE STATEMENT: Have a blinding HA. Did not sleep well last night. Reports that her blood sugar was higher than normal this AM- 184 mg/dL but took her meds as directed.   Pt accompanied by: self  PERTINENT HISTORY: ED visit with pneumonia and anemia and hospitalized 02/02/24-02/07/24; PMH of NASH Liver cirrhosis with gastro-esophageal varices, A fib , pancytopenia, CKD 3B b/l bun/creat 21/1.6,severe morbid obesity, A-fib not on anticoagulation, diabetes, hypertension presented to ED 02/02/24 with shortness of breath and upper respiratory symptoms x 1 week  PAIN:  Are you having pain? Yes: NPRS scale: 7-8/10 Pain location: neck, shoulders, back of head Pain description: headache Aggravating factors: light Relieving factors: nothing  PRECAUTIONS: Fall  RED FLAGS: None   WEIGHT BEARING RESTRICTIONS: No  FALLS: Has patient fallen in last 6 months? No  LIVING ENVIRONMENT: Lives with: lives with their spouse Lives in:  House/apartment Stairs: 1 step into garage Has following equipment at home: Single point cane and Environmental consultant - 4 wheeled  PLOF: Independent with household mobility with device and Independent with community mobility with device  PATIENT GOALS: Want to be more steady on balance; would like to be able to just use cane  OBJECTIVE:       TODAY'S TREATMENT: 03/12/24 Activity Comments  Vitals  110/60 mmHg, 98% spO2, 88bpm   Shoulder rolls fwd/back 10x    Scap retraction 10x3 Some difficulty performing without shrugging- manual cueing provided   UT stretch 3x15 Some difficulty getting into position of stretch, did report good stretch with gentle PT OP and able to copy this with use of strap self-stretch   Cervical retraction 5x3 Self-OP; pt reports difficulty; ROM very limited   Trialed self-massage to B suboccipitals manually, then with tennis ball against wall. Difficulty  d/t R shoulder pain and extent of FHP   Pt reports reduced HA down to 6/10 pain after exercises    Gait training for more upright posture and keeping walker close to her when heading out to waiting room      HOME EXERCISE PROGRAM Last updated: 03/12/24 Access Code: [email or web address] URL: [email or web address] Date: 03/12/2024 Prepared by: [facility] Outpatient  Rehab - [facility]  Exercises - Seated Shoulder Circles  - 1 x daily - 5 x weekly - 2 sets - 10 reps - Seated Upper Trapezius Stretch  - 1 x daily - 5 x weekly - 2 sets - 3 reps - 15 sec hold - Seated Scapular Retraction  - 1 x daily - 5 x weekly - 2 sets - 10 reps - 3 sec hold   PATIENT EDUCATION: Education details: edu on MHP 10 min to UT for muscle tension; HEP, edu on theracane for self-STM to neck and shoulder musculature, edu on reducing FHP  Person educated: Patient Education method: Explanation, Demonstration, Tactile cues, Verbal cues, and Handouts Education comprehension: verbalized understanding and returned  demonstration        Note: Objective measures were completed at Evaluation unless otherwise noted.  DIAGNOSTIC FINDINGS: NA for this episode  COGNITION: Overall cognitive status: Within functional limits for tasks assessed   SENSATION: Light touch: WFL  COORDINATION: Alt toe tapping WFL   POSTURE: rounded shoulders and posterior pelvic tilt  LOWER EXTREMITY ROM:   WFL  Active  Right Eval Left Eval  Hip flexion    Hip extension    Hip abduction    Hip adduction    Hip internal rotation    Hip external rotation    Knee flexion  Knee extension    Ankle dorsiflexion    Ankle plantarflexion    Ankle inversion    Ankle eversion     (Blank rows = not tested)  LOWER EXTREMITY MMT:    MMT Right Eval Left Eval  Hip flexion 4 4  Hip extension    Hip abduction    Hip adduction    Hip internal rotation    Hip external rotation    Knee flexion 4+ 4+  Knee extension 4+ 4+  Ankle dorsiflexion    Ankle plantarflexion    Ankle inversion    Ankle eversion    (Blank rows = not tested)  TRANSFERS: Sit to stand: Modified independence  Assistive device utilized: None     Stand to sit: Modified independence  Assistive device utilized: None       GAIT: Findings: Gait Characteristics: step through pattern, trendelenburg, and wide BOS, Distance walked: 50 ft x 2, Assistive device utilized:Single point cane and Walker - 2 wheeled, Level of assistance: CGA, and Comments: Increased unsteadiness with cane  FUNCTIONAL TESTS:  5 times sit to stand: 15.62 sec Timed up and go (TUG): NT 10 meter walk test: 13.19 sec with rollator (2.49 ft/sec); 16.69 sec with cane (1.97 ft/sec) Lars:  33/56 Pre-test vitals:  98%, 91 HR                                                                                                                               TREATMENT DATE: 03/06/2024    PATIENT EDUCATION: Education details: Eval results, POC Person educated: Patient Education  method: Explanation Education comprehension: verbalized understanding  HOME EXERCISE PROGRAM: Not yet initiated  GOALS: Goals reviewed with patient? Yes  SHORT TERM GOALS: Target date: 04/05/2024  Pt will be independent with HEP for improved balance, strength, gait. Baseline: Goal status: IN PROGRESS   2.  Pt will improve 5x sit<>stand to less than or equal to 12 sec to demonstrate improved functional strength and transfer efficiency. Baseline: 15.62 sec Goal status: IN PROGRESS  3.  Pt will improve Berg score to at least 41/56 to decrease fall risk. Baseline: 33/56 Goal status: IN PROGRESS   LONG TERM GOALS: Target date: 05/03/2024  Pt will be independent with HEP for improved balance, strength, gait. Baseline:  Goal status: IN PROGRESS  2.  Pt will improve gait velocity to at least 2.3 ft/sec with cane for improved gait efficiency and safety. Baseline: 1.97 ft/sec with cane Goal status: IN PROGRESS  3.  Pt will improve Berg score to at least 48/56 to decrease fall risk. Baseline: 33/56 Goal status: IN PROGRESS  4.  Pt will verbalize understanding of fall prevention in home environment.  Baseline:  Goal status: IN PROGRESS  ASSESSMENT:  CLINICAL IMPRESSION: Patient arrived to session with report of severe HA and neck pain this AM without known cause. Vitals were WFL at start of session, thus proceeded with session while monitoring symptoms. Patient performed  gentle cervical and shoulder ROM with cueing for proper positioning. Provided edu on addressing muscle tension and pain at home and provided postural education to reduce strain on cervical musculature. Patient reported slight reduction in HA by end of session.   OBJECTIVE IMPAIRMENTS: Abnormal gait, decreased activity tolerance, decreased balance, decreased endurance, decreased knowledge of use of DME, decreased mobility, difficulty walking, decreased strength, and postural dysfunction.   ACTIVITY LIMITATIONS:  standing, transfers, and locomotion level  PARTICIPATION LIMITATIONS: meal prep, cleaning, laundry, shopping, and community activity  PERSONAL FACTORS: 3+ comorbidities: See PMH above are also affecting patient's functional outcome.   REHAB POTENTIAL: Good  CLINICAL DECISION MAKING: Evolving/moderate complexity  EVALUATION COMPLEXITY: Moderate  PLAN:  PT FREQUENCY: 1-2x/week  PT DURATION: 8 weeks plus eval  PLANNED INTERVENTIONS: 97750- Physical Performance Testing, 97110-Therapeutic exercises, 97530- Therapeutic activity, 97112- Neuromuscular re-education, 97535- Self Care, 02859- Manual therapy, [phone]- Gait training, Patient/Family education, and Balance training  PLAN FOR NEXT SESSION: Initiate HEP for balance, progress lower extremity strengthening    Louana Terrilyn Christians, PT, DPT 03/12/24 9:33 AM  [facility] at [facility] [address] [place], [place] [address] Phone # [phone] Fax # [phone]

## 2024-03-12 ENCOUNTER — Other Ambulatory Visit: Payer: Self-pay | Admitting: Interventional Radiology

## 2024-03-12 ENCOUNTER — Encounter: Payer: Self-pay | Admitting: Physical Therapy

## 2024-03-12 ENCOUNTER — Ambulatory Visit: Admitting: Physical Therapy

## 2024-03-12 DIAGNOSIS — R2689 Other abnormalities of gait and mobility: Secondary | ICD-10-CM

## 2024-03-12 DIAGNOSIS — M6281 Muscle weakness (generalized): Secondary | ICD-10-CM

## 2024-03-12 DIAGNOSIS — R2681 Unsteadiness on feet: Secondary | ICD-10-CM

## 2024-03-12 DIAGNOSIS — K7460 Unspecified cirrhosis of liver: Secondary | ICD-10-CM

## 2024-03-14 DIAGNOSIS — H353112 Nonexudative age-related macular degeneration, right eye, intermediate dry stage: Secondary | ICD-10-CM | POA: Diagnosis not present

## 2024-03-14 DIAGNOSIS — H353221 Exudative age-related macular degeneration, left eye, with active choroidal neovascularization: Secondary | ICD-10-CM | POA: Diagnosis not present

## 2024-03-14 DIAGNOSIS — H35033 Hypertensive retinopathy, bilateral: Secondary | ICD-10-CM | POA: Diagnosis not present

## 2024-03-14 DIAGNOSIS — E119 Type 2 diabetes mellitus without complications: Secondary | ICD-10-CM | POA: Diagnosis not present

## 2024-03-14 DIAGNOSIS — H43813 Vitreous degeneration, bilateral: Secondary | ICD-10-CM | POA: Diagnosis not present

## 2024-03-20 NOTE — Therapy (Deleted)
 OUTPATIENT PHYSICAL THERAPY NEURO TREATMENT   Patient Name: Hannah Khan MRN: [id number] DOB:[birth year], 68 y.o., female Today's Date: 03/20/2024  PCP: Regino Slater, MD REFERRING PROVIDER: Christobal Guadalajara, MD (Hospitalist)>f/u with Regino Slater, MD  END OF SESSION:     Past Medical History:  Diagnosis Date   Anemia    hx of   Aortic stenosis 02/2023   Echo 03/07/23 LVEF 55-60%, mild to moderate AS   Arthritis    knees   CHF (congestive heart failure) (HCC)    chronic diastolic   Diabetes mellitus without complication (HCC)    type 2   Dyspnea    Dysrhythmia    Fibromyalgia    Gastric varices    large   H/O transfusion of platelets    Heart murmur    never has caused any problems   Hx of colonic polyps    s/p partial colectomy   Hyperlipidemia    Hypertension    Joint pain    in knees and back spasms   Left leg swelling    wear compression hose   Liver cirrhosis secondary to NASH (nonalcoholic steatohepatitis) (HCC) dx nov 2014   had enlarged spleen also    Morbid obesity (HCC)    Persistent atrial fibrillation (HCC)    Pneumonia 10/2016   x 5   PONV (postoperative nausea and vomiting)    in past none recent   Portal hypertension (HCC)    Sleep apnea    uses cpap, setting varies between 14-20   Spleen enlarged    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 [facility])    Past Surgical History:  Procedure Laterality Date   BREAST LUMPECTOMY WITH RADIOACTIVE SEED LOCALIZATION Right 08/29/2017   Procedure: RIGHT BREAST LUMPECTOMY WITH RADIOACTIVE SEEDS LOCALIZATION;  Surgeon: Vanderbilt Ned, MD;  Location: [facility] OR;  Service: General;  Laterality: Right;   CESAREAN SECTION  1989   CHOLECYSTECTOMY  20 yrs ago   COLECTOMY  2011   COLONOSCOPY     COLONOSCOPY N/A 09/05/2018   Procedure: COLONOSCOPY;  Surgeon: Burnette Fallow, MD;  Location: [facility];  Service: Endoscopy;  Laterality: N/A;   COLONOSCOPY WITH PROPOFOL  Bilateral 05/04/2022   Procedure: COLONOSCOPY WITH  PROPOFOL ;  Surgeon: Burnette Fallow, MD;  Location: [facility];  Service: Gastroenterology;  Laterality: Bilateral;   DILATATION & CURETTAGE/HYSTEROSCOPY WITH MYOSURE N/A 05/06/2016   Procedure: DILATATION & CURETTAGE/HYSTEROSCOPY WITH MYOSURE WITH POLYPECTOMY;  Surgeon: Delon Prude, DO;  Location: [facility];  Service: Gynecology;  Laterality: N/A;   ESOPHAGOGASTRODUODENOSCOPY (EGD) WITH PROPOFOL  N/A 09/04/2013   Procedure: ESOPHAGOGASTRODUODENOSCOPY (EGD) WITH PROPOFOL ;  Surgeon: Fallow Burnette, MD;  Location: [facility];  Service: Endoscopy;  Laterality: N/A;   ESOPHAGOGASTRODUODENOSCOPY (EGD) WITH PROPOFOL  Bilateral 05/04/2022   Procedure: ESOPHAGOGASTRODUODENOSCOPY (EGD) WITH PROPOFOL ;  Surgeon: Burnette Fallow, MD;  Location: [facility];  Service: Gastroenterology;  Laterality: Bilateral;   IR EMBO ART  VEN HEMORR LYMPH EXTRAV  INC GUIDE ROADMAPPING  06/30/2023   IR INTRAVASCULAR ULTRASOUND NON CORONARY  12/23/2022   IR RADIOLOGIST EVAL & MGMT  09/21/2022   IR RADIOLOGIST EVAL & MGMT  10/06/2022   IR RADIOLOGIST EVAL & MGMT  11/02/2022   IR RADIOLOGIST EVAL & MGMT  01/31/2023   IR RADIOLOGIST EVAL & MGMT  05/31/2023   IR RADIOLOGIST EVAL & MGMT  08/21/2023   IR RADIOLOGIST EVAL & MGMT  12/25/2023   IR TIPS  12/23/2022   IR TIPS  06/30/2023   IR US  GUIDE VASC ACCESS LEFT  06/30/2023   IR  US  GUIDE VASC ACCESS RIGHT  12/23/2022   IR US  GUIDE VASC ACCESS RIGHT  06/30/2023   IR US  GUIDE VASC ACCESS RIGHT  06/30/2023   KNEE ARTHROSCOPY  yrs ago   bilateral, one done x 1, one done twice   POLYPECTOMY  09/05/2018   Procedure: POLYPECTOMY;  Surgeon: Burnette Fallow, MD;  Location: [facility];  Service: Endoscopy;;   RADIOLOGY WITH ANESTHESIA N/A 12/23/2022   Procedure: TIPS;  Surgeon: Jennefer Ester PARAS, MD;  Location: [facility] OR;  Service: Radiology;  Laterality: N/A;   RIGHT HEART CATH N/A 05/19/2023   Procedure: RIGHT HEART CATH;  Surgeon: Gardenia Led, DO;  Location: [facility];  Service:  Cardiovascular;  Laterality: N/A;   TIPS PROCEDURE N/A 06/30/2023   Procedure: TRANS-JUGULAR INTRAHEPATIC PORTAL SHUNT (TIPS);  Surgeon: Jennefer Ester PARAS, MD;  Location: [facility];  Service: Radiology;  Laterality: N/A;   Patient Active Problem List   Diagnosis Date Noted   CHF (congestive heart failure) (HCC) 02/02/2024   Hypoxia 07/04/2023   Nausea & vomiting 07/04/2023   Nausea and vomiting 07/03/2023   Intractable nausea and vomiting 04/07/2023   Intractable vomiting with nausea 04/06/2023   Acute on chronic diastolic heart failure (HCC) 03/06/2023   Chronic hypotension 03/06/2023   Sepsis due to cellulitis (HCC) 03/05/2023   Severe sepsis (HCC) 03/05/2023   Portal hypertension (HCC) 12/23/2022   Acute encephalopathy 01/10/2021   Secondary hypercoagulable state (HCC) 06/23/2020   Streptococcal bacteremia 03/16/2020   Chronic venous stasis dermatitis of both lower extremities 03/16/2020   Atrial fibrillation with RVR (HCC) 04/26/2018   Acute lower UTI 04/26/2018   Liver cirrhosis secondary to NASH (HCC) 04/26/2018   Hyperlipidemia 04/26/2018   Type 2 diabetes mellitus (HCC)    Cellulitis of right lower extremity 03/15/2018   Sepsis (HCC) 03/14/2018   Community acquired pneumonia of right lower lobe of lung 10/31/2016   Muscular abdominal pain in right upper quadrant 07/11/2016   Chronic heart failure with preserved ejection fraction (HFpEF) (HCC) 07/10/2016   Thrombocytopenia (HCC) 07/10/2016   Permanent atrial fibrillation (HCC) 01/30/2016   Type 2 diabetes mellitus without complication, without long-term current use of insulin  (HCC) 01/30/2016   Morbid obesity- 12/10/2015   Cellulitis, abdominal wall    Melena 01/31/2015   Aortic heart murmur 01/31/2015   Anemia 07/23/2013   Obstructive sleep apnea 08/07/2007   Essential hypertension 08/07/2007   Allergic rhinitis 08/07/2007    ONSET DATE: 02/07/2024 (MD referral) REFERRING DIAG: Repeated falls (R29.6);Muscle weakness  (generalized) (M62.81);History of falling (Z91.81) THERAPY DIAG:  No diagnosis found.  Rationale for Evaluation and Treatment: Rehabilitation  SUBJECTIVE:  SUBJECTIVE STATEMENT: ***Have a blinding HA. Did not sleep well last night. Reports that her blood sugar was higher than normal this AM- 184 mg/dL but took her meds as directed.  Pt accompanied by: self  PERTINENT HISTORY: ED visit with pneumonia and anemia and hospitalized 02/02/24-02/07/24; PMH of NASH Liver cirrhosis with gastro-esophageal varices, A fib , pancytopenia, CKD 3B b/l bun/creat 21/1.6,severe morbid obesity, A-fib not on anticoagulation, diabetes, hypertension presented to ED 02/02/24 with shortness of breath and upper respiratory symptoms x 1 week  PAIN:  Are you having pain? Yes: NPRS scale: 7-8/10 Pain location: neck, shoulders, back of head Pain description: headache Aggravating factors: light Relieving factors: nothing  PRECAUTIONS: Fall  WEIGHT BEARING RESTRICTIONS: No  FALLS: Has patient fallen in last 6 months? No  LIVING ENVIRONMENT: Lives with: lives with their spouse Lives in: House/apartment Stairs: 1 step into garage Has following equipment at home: Single point cane and Environmental consultant - 4 wheeled  PLOF: Independent with household mobility with device and Independent with community mobility with device  PATIENT GOALS: Want to be more steady on balance; would like to be able to just use cane  OBJECTIVE:   Today's Treatment 03/21/24:  Activity Comments                             TODAY'S TREATMENT: 03/12/24 Activity Comments  Vitals  110/60 mmHg, 98% spO2, 88bpm   Shoulder rolls fwd/back 10x    Scap retraction 10x3 Some difficulty performing without shrugging- manual cueing provided   UT stretch 3x15 Some  difficulty getting into position of stretch, did report good stretch with gentle PT OP and able to copy this with use of strap self-stretch   Cervical retraction 5x3 Self-OP; pt reports difficulty; ROM very limited   Trialed self-massage to B suboccipitals manually, then with tennis ball against wall. Difficulty  d/t R shoulder pain and extent of FHP   Pt reports reduced HA down to 6/10 pain after exercises    Gait training for more upright posture and keeping walker close to her when heading out to waiting room      HOME EXERCISE PROGRAM Last updated: 03/12/24 Access Code: [email or web address] URL: [email or web address] Date: 03/12/2024 Prepared by: [facility] - Outpatient  Rehab - [facility]  Exercises - Seated Shoulder Circles  - 1 x daily - 5 x weekly - 2 sets - 10 reps - Seated Upper Trapezius Stretch  - 1 x daily - 5 x weekly - 2 sets - 3 reps - 15 sec hold - Seated Scapular Retraction  - 1 x daily - 5 x weekly - 2 sets - 10 reps - 3 sec hold   PATIENT EDUCATION: Education details: edu on MHP 10 min to UT for muscle tension; HEP, edu on theracane for self-STM to neck and shoulder musculature, edu on reducing FHP  Person educated: Patient Education method: Explanation, Demonstration, Tactile cues, Verbal cues, and Handouts Education comprehension: verbalized understanding and returned demonstration  ________________________________________________________________________________ Note: Objective measures were completed at Evaluation unless otherwise noted.  DIAGNOSTIC FINDINGS: NA for this episode  COGNITION: Overall cognitive status: Within functional limits for tasks assessed   SENSATION: Light touch: WFL  COORDINATION: Alt toe tapping WFL  POSTURE: rounded shoulders and posterior pelvic tilt  LOWER EXTREMITY ROM:   WFL Active  Right Eval Left Eval  Hip flexion    Hip extension    Hip abduction    Hip adduction  Hip internal rotation    Hip external  rotation    Knee flexion    Knee extension    Ankle dorsiflexion    Ankle plantarflexion    Ankle inversion    Ankle eversion     (Blank rows = not tested)  LOWER EXTREMITY MMT:   MMT Right Eval Left Eval  Hip flexion 4 4  Hip extension    Hip abduction    Hip adduction    Hip internal rotation    Hip external rotation    Knee flexion 4+ 4+  Knee extension 4+ 4+  Ankle dorsiflexion    Ankle plantarflexion    Ankle inversion    Ankle eversion    (Blank rows = not tested)  TRANSFERS: Sit to stand: Modified independence  Assistive device utilized: None     Stand to sit: Modified independence  Assistive device utilized: None      GAIT: Findings: Gait Characteristics: step through pattern, trendelenburg, and wide BOS, Distance walked: 50 ft x 2, Assistive device utilized:Single point cane and Walker - 2 wheeled, Level of assistance: CGA, and Comments: Increased unsteadiness with cane  FUNCTIONAL TESTS:  5 times sit to stand: 15.62 sec Timed up and go (TUG): NT 10 meter walk test: 13.19 sec with rollator (2.49 ft/sec); 16.69 sec with cane (1.97 ft/sec) Lars:  33/56 Pre-test vitals:  98%, 91 HR                                                                                                                              GOALS: Goals reviewed with patient? Yes  SHORT TERM GOALS: Target date: 04/05/2024  Pt will be independent with HEP for improved balance, strength, gait. Baseline: Goal status: IN PROGRESS   2.  Pt will improve 5x sit<>stand to less than or equal to 12 sec to demonstrate improved functional strength and transfer efficiency. Baseline: 15.62 sec Goal status: IN PROGRESS  3.  Pt will improve Berg score to at least 41/56 to decrease fall risk. Baseline: 33/56 Goal status: IN PROGRESS   LONG TERM GOALS: Target date: 05/03/2024  Pt will be independent with HEP for improved balance, strength, gait. Baseline:  Goal status: IN PROGRESS  2.  Pt will improve  gait velocity to at least 2.3 ft/sec with cane for improved gait efficiency and safety. Baseline: 1.97 ft/sec with cane Goal status: IN PROGRESS  3.  Pt will improve Berg score to at least 48/56 to decrease fall risk. Baseline: 33/56 Goal status: IN PROGRESS  4.  Pt will verbalize understanding of fall prevention in home environment.  Baseline:  Goal status: IN PROGRESS  ASSESSMENT:  CLINICAL IMPRESSION: ***Patient arrived to session with report of severe HA and neck pain this AM without known cause. Vitals were WFL at start of session, thus proceeded with session while monitoring symptoms. Patient performed gentle cervical and shoulder ROM with cueing for proper positioning. Provided edu on addressing muscle tension  and pain at home and provided postural education to reduce strain on cervical musculature. Patient reported slight reduction in HA by end of session.   OBJECTIVE IMPAIRMENTS: Abnormal gait, decreased activity tolerance, decreased balance, decreased endurance, decreased knowledge of use of DME, decreased mobility, difficulty walking, decreased strength, and postural dysfunction.   ACTIVITY LIMITATIONS: standing, transfers, and locomotion level  PARTICIPATION LIMITATIONS: meal prep, cleaning, laundry, shopping, and community activity  PERSONAL FACTORS: 3+ comorbidities: See PMH above are also affecting patient's functional outcome.   REHAB POTENTIAL: Good  CLINICAL DECISION MAKING: Evolving/moderate complexity  EVALUATION COMPLEXITY: Moderate  PLAN:  PT FREQUENCY: 1-2x/week  PT DURATION: 8 weeks plus eval  PLANNED INTERVENTIONS: 97750- Physical Performance Testing, 97110-Therapeutic exercises, 97530- Therapeutic activity, 97112- Neuromuscular re-education, 97535- Self Care, [id number]- Manual therapy, [phone]- Gait training, Patient/Family education, and Balance training  PLAN FOR NEXT SESSION: Initiate HEP for balance, progress lower extremity  strengthening    Louana Terrilyn Christians, PT, DPT 03/20/24 5:08 PM  [facility] at [facility] [address] [place], [place] [address] Phone # [phone] Fax # [phone]

## 2024-03-21 ENCOUNTER — Ambulatory Visit: Attending: Internal Medicine | Admitting: Rehabilitative and Restorative Service Providers"

## 2024-03-21 DIAGNOSIS — R2681 Unsteadiness on feet: Secondary | ICD-10-CM | POA: Insufficient documentation

## 2024-03-21 DIAGNOSIS — R2689 Other abnormalities of gait and mobility: Secondary | ICD-10-CM | POA: Insufficient documentation

## 2024-03-21 DIAGNOSIS — M6281 Muscle weakness (generalized): Secondary | ICD-10-CM | POA: Insufficient documentation

## 2024-03-21 NOTE — Therapy (Signed)
 OUTPATIENT PHYSICAL THERAPY NEURO TREATMENT   Patient Name: Hannah Khan MRN: [id number] DOB:Mar 07, 1956, 68 y.o., female Today's Date: 03/25/2024   PCP: Regino Slater, MD REFERRING PROVIDER: Christobal Guadalajara, MD (Hospitalist)>f/u with Regino Slater, MD   END OF SESSION:  PT End of Session - 03/25/24 1346     Visit Number 3    Number of Visits 17    Date for PT Re-Evaluation 05/03/24    Authorization Type [name]    Authorization Time Period approved 17 PT visits from 03/06/2024-05/03/2024    Authorization - Visit Number 3    Authorization - Number of Visits 17    Progress Note Due on Visit 10    PT Start Time 1306    PT Stop Time 1351    PT Time Calculation (min) 45 min    Activity Tolerance Patient tolerated treatment well    Behavior During Therapy [facility] for tasks assessed/performed            Past Medical History:  Diagnosis Date   Anemia    hx of   Aortic stenosis 02/2023   Echo 03/07/23 LVEF 55-60%, mild to moderate AS   Arthritis    knees   CHF (congestive heart failure) (HCC)    chronic diastolic   Diabetes mellitus without complication (HCC)    type 2   Dyspnea    Dysrhythmia    Fibromyalgia    Gastric varices    large   H/O transfusion of platelets    Heart murmur    never has caused any problems   Hx of colonic polyps    s/p partial colectomy   Hyperlipidemia    Hypertension    Joint pain    in knees and back spasms   Left leg swelling    wear compression hose   Liver cirrhosis secondary to NASH (nonalcoholic steatohepatitis) (HCC) dx nov 2014   had enlarged spleen also    Morbid obesity (HCC)    Persistent atrial fibrillation (HCC)    Pneumonia 10/2016   x 5   PONV (postoperative nausea and vomiting)    in past none recent   Portal hypertension (HCC)    Sleep apnea    uses cpap, setting varies between 14-20   Spleen enlarged    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 [facility])    Past Surgical History:  Procedure Laterality Date    BREAST LUMPECTOMY WITH RADIOACTIVE SEED LOCALIZATION Right 08/29/2017   Procedure: RIGHT BREAST LUMPECTOMY WITH RADIOACTIVE SEEDS LOCALIZATION;  Surgeon: Vanderbilt Ned, MD;  Location: [facility] OR;  Service: General;  Laterality: Right;   CESAREAN SECTION  1989   CHOLECYSTECTOMY  20 yrs ago   COLECTOMY  2011   COLONOSCOPY     COLONOSCOPY N/A 09/05/2018   Procedure: COLONOSCOPY;  Surgeon: Burnette Fallow, MD;  Location: [facility];  Service: Endoscopy;  Laterality: N/A;   COLONOSCOPY WITH PROPOFOL  Bilateral 05/04/2022   Procedure: COLONOSCOPY WITH PROPOFOL ;  Surgeon: Burnette Fallow, MD;  Location: [facility];  Service: Gastroenterology;  Laterality: Bilateral;   DILATATION & CURETTAGE/HYSTEROSCOPY WITH MYOSURE N/A 05/06/2016   Procedure: DILATATION & CURETTAGE/HYSTEROSCOPY WITH MYOSURE WITH POLYPECTOMY;  Surgeon: Delon Prude, DO;  Location: [facility];  Service: Gynecology;  Laterality: N/A;   ESOPHAGOGASTRODUODENOSCOPY (EGD) WITH PROPOFOL  N/A 09/04/2013   Procedure: ESOPHAGOGASTRODUODENOSCOPY (EGD) WITH PROPOFOL ;  Surgeon: Fallow Burnette, MD;  Location: [facility];  Service: Endoscopy;  Laterality: N/A;   ESOPHAGOGASTRODUODENOSCOPY (EGD) WITH PROPOFOL  Bilateral 05/04/2022   Procedure: ESOPHAGOGASTRODUODENOSCOPY (EGD) WITH PROPOFOL ;  Surgeon:  Burnette Fallow, MD;  Location: [facility];  Service: Gastroenterology;  Laterality: Bilateral;   IR EMBO ART  VEN HEMORR LYMPH EXTRAV  INC GUIDE ROADMAPPING  06/30/2023   IR INTRAVASCULAR ULTRASOUND NON CORONARY  12/23/2022   IR RADIOLOGIST EVAL & MGMT  09/21/2022   IR RADIOLOGIST EVAL & MGMT  10/06/2022   IR RADIOLOGIST EVAL & MGMT  11/02/2022   IR RADIOLOGIST EVAL & MGMT  01/31/2023   IR RADIOLOGIST EVAL & MGMT  05/31/2023   IR RADIOLOGIST EVAL & MGMT  08/21/2023   IR RADIOLOGIST EVAL & MGMT  12/25/2023   IR TIPS  12/23/2022   IR TIPS  06/30/2023   IR US  GUIDE VASC ACCESS LEFT  06/30/2023   IR US  GUIDE VASC ACCESS RIGHT  12/23/2022   IR US  GUIDE VASC ACCESS RIGHT   06/30/2023   IR US  GUIDE VASC ACCESS RIGHT  06/30/2023   KNEE ARTHROSCOPY  yrs ago   bilateral, one done x 1, one done twice   POLYPECTOMY  09/05/2018   Procedure: POLYPECTOMY;  Surgeon: Burnette Fallow, MD;  Location: [facility];  Service: Endoscopy;;   RADIOLOGY WITH ANESTHESIA N/A 12/23/2022   Procedure: TIPS;  Surgeon: Jennefer Ester PARAS, MD;  Location: [facility] OR;  Service: Radiology;  Laterality: N/A;   RIGHT HEART CATH N/A 05/19/2023   Procedure: RIGHT HEART CATH;  Surgeon: Gardenia Led, DO;  Location: [facility];  Service: Cardiovascular;  Laterality: N/A;   TIPS PROCEDURE N/A 06/30/2023   Procedure: TRANS-JUGULAR INTRAHEPATIC PORTAL SHUNT (TIPS);  Surgeon: Jennefer Ester PARAS, MD;  Location: [facility] OR;  Service: Radiology;  Laterality: N/A;   Patient Active Problem List   Diagnosis Date Noted   CHF (congestive heart failure) (HCC) 02/02/2024   Hypoxia 07/04/2023   Nausea & vomiting 07/04/2023   Nausea and vomiting 07/03/2023   Intractable nausea and vomiting 04/07/2023   Intractable vomiting with nausea 04/06/2023   Acute on chronic diastolic heart failure (HCC) 03/06/2023   Chronic hypotension 03/06/2023   Sepsis due to cellulitis (HCC) 03/05/2023   Severe sepsis (HCC) 03/05/2023   Portal hypertension (HCC) 12/23/2022   Acute encephalopathy 01/10/2021   Secondary hypercoagulable state (HCC) 06/23/2020   Streptococcal bacteremia 03/16/2020   Chronic venous stasis dermatitis of both lower extremities 03/16/2020   Atrial fibrillation with RVR (HCC) 04/26/2018   Acute lower UTI 04/26/2018   Liver cirrhosis secondary to NASH (HCC) 04/26/2018   Hyperlipidemia 04/26/2018   Type 2 diabetes mellitus (HCC)    Cellulitis of right lower extremity 03/15/2018   Sepsis (HCC) 03/14/2018   Community acquired pneumonia of right lower lobe of lung 10/31/2016   Muscular abdominal pain in right upper quadrant 07/11/2016   Chronic heart failure with preserved ejection fraction (HFpEF) (HCC)  07/10/2016   Thrombocytopenia (HCC) 07/10/2016   Permanent atrial fibrillation (HCC) 01/30/2016   Type 2 diabetes mellitus without complication, without long-term current use of insulin  (HCC) 01/30/2016   Morbid obesity- 12/10/2015   Cellulitis, abdominal wall    Melena 01/31/2015   Aortic heart murmur 01/31/2015   Anemia 07/23/2013   Obstructive sleep apnea 08/07/2007   Essential hypertension 08/07/2007   Allergic rhinitis 08/07/2007    ONSET DATE: 02/07/2024 (MD referral)  REFERRING DIAG: Repeated falls (R29.6);Muscle weakness (generalized) (M62.81);History of falling (Z91.81)  THERAPY DIAG:  Muscle weakness (generalized)  Unsteadiness on feet  Other abnormalities of gait and mobility  Rationale for Evaluation and Treatment: Rehabilitation  SUBJECTIVE:  SUBJECTIVE STATEMENT: I missed last week because my husband had a diabetic issue. Had to call the ambulance. Had to lift her husband and shoulders were really sore.   Pt accompanied by: self  PERTINENT HISTORY: ED visit with pneumonia and anemia and hospitalized 02/02/24-02/07/24; PMH of NASH Liver cirrhosis with gastro-esophageal varices, A fib , pancytopenia, CKD 3B b/l bun/creat 21/1.6,severe morbid obesity, A-fib not on anticoagulation, diabetes, hypertension presented to ED 02/02/24 with shortness of breath and upper respiratory symptoms x 1 week  PAIN:  Are you having pain? Yes: NPRS scale: 8/10 Pain location: neck, shoulders Pain description: tight Aggravating factors: movement Relieving factors: nothing  PRECAUTIONS: Fall  RED FLAGS: None   WEIGHT BEARING RESTRICTIONS: No  FALLS: Has patient fallen in last 6 months? No  LIVING ENVIRONMENT: Lives with: lives with their spouse Lives in: House/apartment Stairs: 1 step into  garage Has following equipment at home: Single point cane and Environmental consultant - 4 wheeled  PLOF: Independent with household mobility with device and Independent with community mobility with device  PATIENT GOALS: Want to be more steady on balance; would like to be able to just use cane  OBJECTIVE:     TODAY'S TREATMENT: 03/25/24 Activity Comments  LAQ 3# 2x10 each  Good TKE; also demonstrated how to perform this with yellow TB loop  STS 2x10 without UE support Cueing to stand fully to engage glutes   mini squat 10x PT holding pt's walker still for safety d/t broken brake. Encouraged this at counter top at home instead. Good effort to perform slight hip hinge  romberg 2x30 Mild-mod sway. Pt reports starting to feel dizzy when she exhibits imbalance. Performed at TM rail for safety  romberg + head turns/nods Report of dizziness with head turns. Performed at TM rail for safety     HOME EXERCISE PROGRAM Access Code: [email or web address] URL: [email or web address] Date: 03/25/2024 Prepared by: [facility] Outpatient  Rehab - [facility]  Exercises - Seated Shoulder Circles  - 1 x daily - 5 x weekly - 2 sets - 10 reps - Seated Upper Trapezius Stretch  - 1 x daily - 5 x weekly - 2 sets - 3 reps - 15 sec hold - Seated Scapular Retraction  - 1 x daily - 5 x weekly - 2 sets - 10 reps - 3 sec hold - Sit to Stand with Arms Crossed  - 1 x daily - 5 x weekly - 2 sets - 10 reps - Sitting Knee Extension with Resistance  - 1 x daily - 5 x weekly - 2 sets - 10 reps - Standing Near Stance in Corner with Eyes Closed  - 1 x daily - 5 x weekly - 2 sets - 30 sec hold   PATIENT EDUCATION: Education details: HEP update with edu for safety  Person educated: Patient Education method: Explanation, Demonstration, Tactile cues, Verbal cues, and Handouts Education comprehension: verbalized understanding and returned demonstration    Note: Objective measures were completed at Evaluation unless otherwise  noted.  DIAGNOSTIC FINDINGS: NA for this episode  COGNITION: Overall cognitive status: Within functional limits for tasks assessed   SENSATION: Light touch: WFL  COORDINATION: Alt toe tapping WFL   POSTURE: rounded shoulders and posterior pelvic tilt  LOWER EXTREMITY ROM:   WFL  Active  Right Eval Left Eval  Hip flexion    Hip extension    Hip abduction    Hip adduction    Hip internal rotation    Hip external rotation  Knee flexion    Knee extension    Ankle dorsiflexion    Ankle plantarflexion    Ankle inversion    Ankle eversion     (Blank rows = not tested)  LOWER EXTREMITY MMT:    MMT Right Eval Left Eval  Hip flexion 4 4  Hip extension    Hip abduction    Hip adduction    Hip internal rotation    Hip external rotation    Knee flexion 4+ 4+  Knee extension 4+ 4+  Ankle dorsiflexion    Ankle plantarflexion    Ankle inversion    Ankle eversion    (Blank rows = not tested)  TRANSFERS: Sit to stand: Modified independence  Assistive device utilized: None     Stand to sit: Modified independence  Assistive device utilized: None       GAIT: Findings: Gait Characteristics: step through pattern, trendelenburg, and wide BOS, Distance walked: 50 ft x 2, Assistive device utilized:Single point cane and Walker - 2 wheeled, Level of assistance: CGA, and Comments: Increased unsteadiness with cane  FUNCTIONAL TESTS:  5 times sit to stand: 15.62 sec Timed up and go (TUG): NT 10 meter walk test: 13.19 sec with rollator (2.49 ft/sec); 16.69 sec with cane (1.97 ft/sec) Lars:  33/56 Pre-test vitals:  98%, 91 HR                                                                                                                               TREATMENT DATE: 03/06/2024    PATIENT EDUCATION: Education details: Eval results, POC Person educated: Patient Education method: Explanation Education comprehension: verbalized understanding  HOME EXERCISE PROGRAM: Not yet  initiated  GOALS: Goals reviewed with patient? Yes  SHORT TERM GOALS: Target date: 04/05/2024  Pt will be independent with HEP for improved balance, strength, gait. Baseline: Goal status: IN PROGRESS   2.  Pt will improve 5x sit<>stand to less than or equal to 12 sec to demonstrate improved functional strength and transfer efficiency. Baseline: 15.62 sec Goal status: IN PROGRESS  3.  Pt will improve Berg score to at least 41/56 to decrease fall risk. Baseline: 33/56 Goal status: IN PROGRESS   LONG TERM GOALS: Target date: 05/03/2024  Pt will be independent with HEP for improved balance, strength, gait. Baseline:  Goal status: IN PROGRESS  2.  Pt will improve gait velocity to at least 2.3 ft/sec with cane for improved gait efficiency and safety. Baseline: 1.97 ft/sec with cane Goal status: IN PROGRESS  3.  Pt will improve Berg score to at least 48/56 to decrease fall risk. Baseline: 33/56 Goal status: IN PROGRESS  4.  Pt will verbalize understanding of fall prevention in home environment.  Baseline:  Goal status: IN PROGRESS  ASSESSMENT:  CLINICAL IMPRESSION: Patient arrived to session with report of continued neck/shoulder pain. She reports soreness in B shoulders after having to lift her husband who was having a medical event.  Session focused on LE strengthening in sitting and standing. Provided cueing for posture. She fatigues quickly and requires short sit breaks intermittently. Pt also reports dizziness with balance exercises both with static balance and with addition of head turns. Educated on performed static balance exercises safely at home. Patient tolerated session well and without complaints upon leaving.   OBJECTIVE IMPAIRMENTS: Abnormal gait, decreased activity tolerance, decreased balance, decreased endurance, decreased knowledge of use of DME, decreased mobility, difficulty walking, decreased strength, and postural dysfunction.   ACTIVITY LIMITATIONS:  standing, transfers, and locomotion level  PARTICIPATION LIMITATIONS: meal prep, cleaning, laundry, shopping, and community activity  PERSONAL FACTORS: 3+ comorbidities: See PMH above are also affecting patient's functional outcome.   REHAB POTENTIAL: Good  CLINICAL DECISION MAKING: Evolving/moderate complexity  EVALUATION COMPLEXITY: Moderate  PLAN:  PT FREQUENCY: 1-2x/week  PT DURATION: 8 weeks plus eval  PLANNED INTERVENTIONS: 97750- Physical Performance Testing, 97110-Therapeutic exercises, 97530- Therapeutic activity, 97112- Neuromuscular re-education, 97535- Self Care, [id number]- Manual therapy, [phone]- Gait training, Patient/Family education, and Balance training  PLAN FOR NEXT SESSION: balance, progress lower extremity strengthening    Louana Terrilyn Christians, PT, DPT 03/25/24 1:52 PM  [facility] [address] [place], [place] [address] Phone # [phone] Fax # [phone]

## 2024-03-25 ENCOUNTER — Ambulatory Visit: Admitting: Physical Therapy

## 2024-03-25 ENCOUNTER — Encounter: Payer: Self-pay | Admitting: Physical Therapy

## 2024-03-25 DIAGNOSIS — R2681 Unsteadiness on feet: Secondary | ICD-10-CM

## 2024-03-25 DIAGNOSIS — M6281 Muscle weakness (generalized): Secondary | ICD-10-CM

## 2024-03-25 DIAGNOSIS — R2689 Other abnormalities of gait and mobility: Secondary | ICD-10-CM | POA: Diagnosis not present

## 2024-03-26 DIAGNOSIS — L03311 Cellulitis of abdominal wall: Secondary | ICD-10-CM | POA: Diagnosis not present

## 2024-04-01 ENCOUNTER — Ambulatory Visit: Admitting: Physical Therapy

## 2024-04-01 ENCOUNTER — Encounter: Payer: Self-pay | Admitting: Physical Therapy

## 2024-04-01 DIAGNOSIS — M6281 Muscle weakness (generalized): Secondary | ICD-10-CM | POA: Diagnosis not present

## 2024-04-01 DIAGNOSIS — R2689 Other abnormalities of gait and mobility: Secondary | ICD-10-CM

## 2024-04-01 DIAGNOSIS — R2681 Unsteadiness on feet: Secondary | ICD-10-CM | POA: Diagnosis not present

## 2024-04-01 NOTE — Therapy (Signed)
 OUTPATIENT PHYSICAL THERAPY NEURO TREATMENT   Patient Name: Hannah Khan MRN: [id number] DOB:12-13-1955, 68 y.o., female Today's Date: 04/01/2024   PCP: Regino Slater, MD REFERRING PROVIDER: Christobal Guadalajara, MD (Hospitalist)>f/u with Regino Slater, MD   END OF SESSION:  PT End of Session - 04/01/24 1322     Visit Number 4    Number of Visits 17    Date for PT Re-Evaluation 05/03/24    Authorization Type [name]    Authorization Time Period approved 17 PT visits from 03/06/2024-05/03/2024    Authorization - Visit Number 4    Authorization - Number of Visits 17    Progress Note Due on Visit 10    PT Start Time 1322    PT Stop Time 1405    PT Time Calculation (min) 43 min    Activity Tolerance Patient tolerated treatment well    Behavior During Therapy WFL for tasks assessed/performed            Past Medical History:  Diagnosis Date   Anemia    hx of   Aortic stenosis 02/2023   Echo 03/07/23 LVEF 55-60%, mild to moderate AS   Arthritis    knees   CHF (congestive heart failure) (HCC)    chronic diastolic   Diabetes mellitus without complication (HCC)    type 2   Dyspnea    Dysrhythmia    Fibromyalgia    Gastric varices    large   H/O transfusion of platelets    Heart murmur    never has caused any problems   Hx of colonic polyps    s/p partial colectomy   Hyperlipidemia    Hypertension    Joint pain    in knees and back spasms   Left leg swelling    wear compression hose   Liver cirrhosis secondary to NASH (nonalcoholic steatohepatitis) (HCC) dx nov 2014   had enlarged spleen also    Morbid obesity (HCC)    Persistent atrial fibrillation (HCC)    Pneumonia 10/2016   x 5   PONV (postoperative nausea and vomiting)    in past none recent   Portal hypertension (HCC)    Sleep apnea    uses cpap, setting varies between 14-20   Spleen enlarged    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 [facility])    Past Surgical History:  Procedure Laterality Date    BREAST LUMPECTOMY WITH RADIOACTIVE SEED LOCALIZATION Right 08/29/2017   Procedure: RIGHT BREAST LUMPECTOMY WITH RADIOACTIVE SEEDS LOCALIZATION;  Surgeon: Vanderbilt Ned, MD;  Location: [facility] OR;  Service: General;  Laterality: Right;   CESAREAN SECTION  1989   CHOLECYSTECTOMY  20 yrs ago   COLECTOMY  2011   COLONOSCOPY     COLONOSCOPY N/A 09/05/2018   Procedure: COLONOSCOPY;  Surgeon: Burnette Fallow, MD;  Location: [facility];  Service: Endoscopy;  Laterality: N/A;   COLONOSCOPY WITH PROPOFOL  Bilateral 05/04/2022   Procedure: COLONOSCOPY WITH PROPOFOL ;  Surgeon: Burnette Fallow, MD;  Location: [facility];  Service: Gastroenterology;  Laterality: Bilateral;   DILATATION & CURETTAGE/HYSTEROSCOPY WITH MYOSURE N/A 05/06/2016   Procedure: DILATATION & CURETTAGE/HYSTEROSCOPY WITH MYOSURE WITH POLYPECTOMY;  Surgeon: Delon Prude, DO;  Location: [facility];  Service: Gynecology;  Laterality: N/A;   ESOPHAGOGASTRODUODENOSCOPY (EGD) WITH PROPOFOL  N/A 09/04/2013   Procedure: ESOPHAGOGASTRODUODENOSCOPY (EGD) WITH PROPOFOL ;  Surgeon: Fallow Burnette, MD;  Location: [facility];  Service: Endoscopy;  Laterality: N/A;   ESOPHAGOGASTRODUODENOSCOPY (EGD) WITH PROPOFOL  Bilateral 05/04/2022   Procedure: ESOPHAGOGASTRODUODENOSCOPY (EGD) WITH PROPOFOL ;  Surgeon:  Burnette Fallow, MD;  Location: [facility];  Service: Gastroenterology;  Laterality: Bilateral;   IR EMBO ART  VEN HEMORR LYMPH EXTRAV  INC GUIDE ROADMAPPING  06/30/2023   IR INTRAVASCULAR ULTRASOUND NON CORONARY  12/23/2022   IR RADIOLOGIST EVAL & MGMT  09/21/2022   IR RADIOLOGIST EVAL & MGMT  10/06/2022   IR RADIOLOGIST EVAL & MGMT  11/02/2022   IR RADIOLOGIST EVAL & MGMT  01/31/2023   IR RADIOLOGIST EVAL & MGMT  05/31/2023   IR RADIOLOGIST EVAL & MGMT  08/21/2023   IR RADIOLOGIST EVAL & MGMT  12/25/2023   IR TIPS  12/23/2022   IR TIPS  06/30/2023   IR US  GUIDE VASC ACCESS LEFT  06/30/2023   IR US  GUIDE VASC ACCESS RIGHT  12/23/2022   IR US  GUIDE VASC ACCESS  RIGHT  06/30/2023   IR US  GUIDE VASC ACCESS RIGHT  06/30/2023   KNEE ARTHROSCOPY  yrs ago   bilateral, one done x 1, one done twice   POLYPECTOMY  09/05/2018   Procedure: POLYPECTOMY;  Surgeon: Burnette Fallow, MD;  Location: [facility];  Service: Endoscopy;;   RADIOLOGY WITH ANESTHESIA N/A 12/23/2022   Procedure: TIPS;  Surgeon: Jennefer Ester PARAS, MD;  Location: [facility];  Service: Radiology;  Laterality: N/A;   RIGHT HEART CATH N/A 05/19/2023   Procedure: RIGHT HEART CATH;  Surgeon: Gardenia Led, DO;  Location: [facility];  Service: Cardiovascular;  Laterality: N/A;   TIPS PROCEDURE N/A 06/30/2023   Procedure: TRANS-JUGULAR INTRAHEPATIC PORTAL SHUNT (TIPS);  Surgeon: Jennefer Ester PARAS, MD;  Location[facility];  Service: Radiology;  Laterality: N/A;   Patient Active Problem List   Diagnosis Date Noted   CHF (congestive heart failure) (HCC) 02/02/2024   Hypoxia 07/04/2023   Nausea & vomiting 07/04/2023   Nausea and vomiting 07/03/2023   Intractable nausea and vomiting 04/07/2023   Intractable vomiting with nausea 04/06/2023   Acute on chronic diastolic heart failure (HCC) 03/06/2023   Chronic hypotension 03/06/2023   Sepsis due to cellulitis (HCC) 03/05/2023   Severe sepsis (HCC) 03/05/2023   Portal hypertension (HCC) 12/23/2022   Acute encephalopathy 01/10/2021   Secondary hypercoagulable state (HCC) 06/23/2020   Streptococcal bacteremia 03/16/2020   Chronic venous stasis dermatitis of both lower extremities 03/16/2020   Atrial fibrillation with RVR (HCC) 04/26/2018   Acute lower UTI 04/26/2018   Liver cirrhosis secondary to NASH (HCC) 04/26/2018   Hyperlipidemia 04/26/2018   Type 2 diabetes mellitus (HCC)    Cellulitis of right lower extremity 03/15/2018   Sepsis (HCC) 03/14/2018   Community acquired pneumonia of right lower lobe of lung 10/31/2016   Muscular abdominal pain in right upper quadrant 07/11/2016   Chronic heart failure with preserved ejection fraction (HFpEF)  (HCC) 07/10/2016   Thrombocytopenia (HCC) 07/10/2016   Permanent atrial fibrillation (HCC) 01/30/2016   Type 2 diabetes mellitus without complication, without long-term current use of insulin  (HCC) 01/30/2016   Morbid obesity- 12/10/2015   Cellulitis, abdominal wall    Melena 01/31/2015   Aortic heart murmur 01/31/2015   Anemia 07/23/2013   Obstructive sleep apnea 08/07/2007   Essential hypertension 08/07/2007   Allergic rhinitis 08/07/2007    ONSET DATE: 02/07/2024 (MD referral)  REFERRING DIAG: Repeated falls (R29.6);Muscle weakness (generalized) (M62.81);History of falling (Z91.81)  THERAPY DIAG:  Muscle weakness (generalized)  Unsteadiness on feet  Other abnormalities of gait and mobility  Rationale for Evaluation and Treatment: Rehabilitation  SUBJECTIVE:  SUBJECTIVE STATEMENT: Just monitoring BP and HR-HR has been low and BP was low.  Shoulders still hurting from helping husband when he had the diabetic event.  Pt accompanied by: self  PERTINENT HISTORY: ED visit with pneumonia and anemia and hospitalized 02/02/24-02/07/24; PMH of NASH Liver cirrhosis with gastro-esophageal varices, A fib , pancytopenia, CKD 3B b/l bun/creat 21/1.6,severe morbid obesity, A-fib not on anticoagulation, diabetes, hypertension presented to ED 02/02/24 with shortness of breath and upper respiratory symptoms x 1 week  PAIN:  Are you having pain? Yes: NPRS scale: 7-8/10 Pain location: neck, shoulders Pain description: tight Aggravating factors: movement Relieving factors: nothing  PRECAUTIONS: Fall  RED FLAGS: None   WEIGHT BEARING RESTRICTIONS: No  FALLS: Has patient fallen in last 6 months? No  LIVING ENVIRONMENT: Lives with: lives with their spouse Lives in: House/apartment Stairs: 1 step into  garage Has following equipment at home: Single point cane and Environmental consultant - 4 wheeled  PLOF: Independent with household mobility with device and Independent with community mobility with device  PATIENT GOALS: Want to be more steady on balance; would like to be able to just use cane  OBJECTIVE:    TODAY'S TREATMENT: 04/01/2024 Activity Comments  Vitals: HR 106 bpm; O2 98% 100/58 HR 96 bpm-rechecked Manual cuff for BP  FTSTS:  18.78 sec   Berg Balance:  39/56 Improved from 33/56  Verbal review of HEP Verbalizes understanding  Sitting hip abduction/clamshell  Sitting step out/in 2 x 10, green theraband  Vitals:  98% and HR 103-113 bpm      [facility] PT Assessment - 04/01/24 1340       Standardized Balance Assessment   Standardized Balance Assessment Berg Balance Test      Berg Balance Test   Sit to Stand Able to stand without using hands and stabilize independently    Standing Unsupported Able to stand safely 2 minutes    Sitting with Back Unsupported but Feet Supported on Floor or Stool Able to sit safely and securely 2 minutes    Stand to Sit Sits safely with minimal use of hands    Transfers Able to transfer safely, minor use of hands    Standing Unsupported with Eyes Closed Able to stand 10 seconds with supervision    Standing Unsupported with Feet Together Able to place feet together independently but unable to hold for 30 seconds    From Standing, Reach Forward with Outstretched Arm Can reach forward >12 cm safely (5)   6   From Standing Position, Pick up Object from Floor Able to pick up shoe, needs supervision    From Standing Position, Turn to Look Behind Over each Shoulder Looks behind from both sides and weight shifts well    Turn 360 Degrees Able to turn 360 degrees safely but slowly    Standing Unsupported, Alternately Place Feet on Step/Stool Needs assistance to keep from falling or unable to try    Standing Unsupported, One Foot in Front Able to take small step  independently and hold 30 seconds    Standing on One Leg Unable to try or needs assist to prevent fall    Total Score 39    Berg comment: improved from 33/56          HOME EXERCISE PROGRAM Access Code: [email or web address] URL: [email or web address] Date: 04/01/2024 Prepared by: [facility] - Outpatient  Rehab - [facility]  Exercises - Seated Shoulder Circles  - 1 x daily - 5 x weekly - 2 sets - 10  reps - Seated Upper Trapezius Stretch  - 1 x daily - 5 x weekly - 2 sets - 3 reps - 15 sec hold - Seated Scapular Retraction  - 1 x daily - 5 x weekly - 2 sets - 10 reps - 3 sec hold - Sit to Stand with Arms Crossed  - 1 x daily - 5 x weekly - 2 sets - 10 reps - Sitting Knee Extension with Resistance  - 1 x daily - 5 x weekly - 2 sets - 10 reps - Standing Near Stance in Corner with Eyes Closed  - 1 x daily - 5 x weekly - 2 sets - 30 sec hold - Seated Hip Abduction with Resistance  - 1 x daily - 7 x weekly - 3 sets - 10 reps  TREATMENT: 03/25/24 Activity Comments  LAQ 3# 2x10 each  Good TKE; also demonstrated how to perform this with yellow TB loop  STS 2x10 without UE support Cueing to stand fully to engage glutes   mini squat 10x PT holding pt's walker still for safety d/t broken brake. Encouraged this at counter top at home instead. Good effort to perform slight hip hinge  romberg 2x30 Mild-mod sway. Pt reports starting to feel dizzy when she exhibits imbalance. Performed at TM rail for safety  romberg + head turns/nods Report of dizziness with head turns. Performed at TM rail for safety     HOME EXERCISE PROGRAM Access Code: [email or web address] URL: [email or web address] Date: 03/25/2024 Prepared by: [facility] - Outpatient  Rehab - [facility]  Exercises - Seated Shoulder Circles  - 1 x daily - 5 x weekly - 2 sets - 10 reps - Seated Upper Trapezius Stretch  - 1 x daily - 5 x weekly - 2 sets - 3 reps - 15 sec hold - Seated Scapular Retraction  - 1 x daily - 5 x  weekly - 2 sets - 10 reps - 3 sec hold - Sit to Stand with Arms Crossed  - 1 x daily - 5 x weekly - 2 sets - 10 reps - Sitting Knee Extension with Resistance  - 1 x daily - 5 x weekly - 2 sets - 10 reps - Standing Near Stance in Corner with Eyes Closed  - 1 x daily - 5 x weekly - 2 sets - 30 sec hold   PATIENT EDUCATION: Education details: HEP update, progress towards goals Person educated: Patient Education method: Explanation, Demonstration, Tactile cues, Verbal cues, and Handouts Education comprehension: verbalized understanding and returned demonstration    Note: Objective measures were completed at Evaluation unless otherwise noted.  DIAGNOSTIC FINDINGS: NA for this episode  COGNITION: Overall cognitive status: Within functional limits for tasks assessed   SENSATION: Light touch: WFL  COORDINATION: Alt toe tapping WFL   POSTURE: rounded shoulders and posterior pelvic tilt  LOWER EXTREMITY ROM:   WFL  Active  Right Eval Left Eval  Hip flexion    Hip extension    Hip abduction    Hip adduction    Hip internal rotation    Hip external rotation    Knee flexion    Knee extension    Ankle dorsiflexion    Ankle plantarflexion    Ankle inversion    Ankle eversion     (Blank rows = not tested)  LOWER EXTREMITY MMT:    MMT Right Eval Left Eval  Hip flexion 4 4  Hip extension    Hip abduction    Hip adduction  Hip internal rotation    Hip external rotation    Knee flexion 4+ 4+  Knee extension 4+ 4+  Ankle dorsiflexion    Ankle plantarflexion    Ankle inversion    Ankle eversion    (Blank rows = not tested)  TRANSFERS: Sit to stand: Modified independence  Assistive device utilized: None     Stand to sit: Modified independence  Assistive device utilized: None       GAIT: Findings: Gait Characteristics: step through pattern, trendelenburg, and wide BOS, Distance walked: 50 ft x 2, Assistive device utilized:Single point cane and Walker - 2 wheeled,  Level of assistance: CGA, and Comments: Increased unsteadiness with cane  FUNCTIONAL TESTS:  5 times sit to stand: 15.62 sec Timed up and go (TUG): NT 10 meter walk test: 13.19 sec with rollator (2.49 ft/sec); 16.69 sec with cane (1.97 ft/sec) Lars:  33/56 Pre-test vitals:  98%, 91 HR                                                                                                                               TREATMENT DATE: 03/06/2024    PATIENT EDUCATION: Education details: Eval results, POC Person educated: Patient Education method: Explanation Education comprehension: verbalized understanding  HOME EXERCISE PROGRAM: Not yet initiated  GOALS: Goals reviewed with patient? Yes  SHORT TERM GOALS: Target date: 04/05/2024  Pt will be independent with HEP for improved balance, strength, gait. Baseline: Goal status: MET, per report 04/01/2024  2.  Pt will improve 5x sit<>stand to less than or equal to 12 sec to demonstrate improved functional strength and transfer efficiency. Baseline: 15.62 sec>18 sec 04/01/2024 Goal status: IN PROGRESS, 04/01/2024  3.  Pt will improve Berg score to at least 41/56 to decrease fall risk. Baseline: 33/56>39/56 04/01/2024 Goal status: PARTIALLY MET, 04/01/2024   LONG TERM GOALS: Target date: 05/03/2024  Pt will be independent with HEP for improved balance, strength, gait. Baseline:  Goal status: IN PROGRESS  2.  Pt will improve gait velocity to at least 2.3 ft/sec with cane for improved gait efficiency and safety. Baseline: 1.97 ft/sec with cane Goal status: IN PROGRESS  3.  Pt will improve Berg score to at least 48/56 to decrease fall risk. Baseline: 33/56 Goal status: IN PROGRESS  4.  Pt will verbalize understanding of fall prevention in home environment.  Baseline:  Goal status: IN PROGRESS  ASSESSMENT:  CLINICAL IMPRESSION: Pt presents today and reports some fluctuations in BP and HR recently.  Vitals measured throughout session,  and pt appears with slightly elevated resting HR, but remains similar throughout session.  She requires frequent rest breaks due to fatigue, but vitals remain WFL. Assessed STGs today and pt has met STG 1; she has not met STG 2 for FTSTS, and STG 3 is partially met with improved Berg score, just not to goal level.  Berg score of 39/56 is improved from 33/56, but pt does remain at increased  fall risk.  She does note being more off balance laterally with narrowed BOS and with attempt at [facility]; began to address with hip strengthening in sitting.  Pt will continue to benefit from skilled PT towards goals for improved functional mobility and decreased fall risk.   OBJECTIVE IMPAIRMENTS: Abnormal gait, decreased activity tolerance, decreased balance, decreased endurance, decreased knowledge of use of DME, decreased mobility, difficulty walking, decreased strength, and postural dysfunction.   ACTIVITY LIMITATIONS: standing, transfers, and locomotion level  PARTICIPATION LIMITATIONS: meal prep, cleaning, laundry, shopping, and community activity  PERSONAL FACTORS: 3+ comorbidities: See PMH above are also affecting patient's functional outcome.   REHAB POTENTIAL: Good  CLINICAL DECISION MAKING: Evolving/moderate complexity  EVALUATION COMPLEXITY: Moderate  PLAN:  PT FREQUENCY: 1-2x/week  PT DURATION: 8 weeks plus eval  PLANNED INTERVENTIONS: 97750- Physical Performance Testing, 97110-Therapeutic exercises, 97530- Therapeutic activity, 97112- Neuromuscular re-education, 97535- Self Care, [id number]- Manual therapy, [phone]- Gait training, Patient/Family education, and Balance training  PLAN FOR NEXT SESSION: Review additions to HEP, work to improve balance-consider alternating step taps, side steps;  progress lower extremity strengthening.  May need to consider adding more appointments    Greig Anon, PT 04/01/24 2:10 PM Phone: [phone] Fax: [phone]   [facility] at  [facility] [address] [place], [place] [address] Phone # [phone] Fax # [phone]

## 2024-04-09 NOTE — Therapy (Incomplete)
 OUTPATIENT PHYSICAL THERAPY NEURO TREATMENT   Patient Name: Hannah Khan MRN: [id number] DOB:05/04/1956, 68 y.o., female Today's Date: 04/09/2024   PCP: Regino Slater, MD REFERRING PROVIDER: Christobal Guadalajara, MD (Hospitalist)>f/u with Regino Slater, MD   END OF SESSION:      Past Medical History:  Diagnosis Date   Anemia    hx of   Aortic stenosis 02/2023   Echo 03/07/23 LVEF 55-60%, mild to moderate AS   Arthritis    knees   CHF (congestive heart failure) (HCC)    chronic diastolic   Diabetes mellitus without complication (HCC)    type 2   Dyspnea    Dysrhythmia    Fibromyalgia    Gastric varices    large   H/O transfusion of platelets    Heart murmur    never has caused any problems   Hx of colonic polyps    s/p partial colectomy   Hyperlipidemia    Hypertension    Joint pain    in knees and back spasms   Left leg swelling    wear compression hose   Liver cirrhosis secondary to NASH (nonalcoholic steatohepatitis) (HCC) dx nov 2014   had enlarged spleen also    Morbid obesity (HCC)    Persistent atrial fibrillation (HCC)    Pneumonia 10/2016   x 5   PONV (postoperative nausea and vomiting)    in past none recent   Portal hypertension (HCC)    Sleep apnea    uses cpap, setting varies between 14-20   Spleen enlarged    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 [facility])    Past Surgical History:  Procedure Laterality Date   BREAST LUMPECTOMY WITH RADIOACTIVE SEED LOCALIZATION Right 08/29/2017   Procedure: RIGHT BREAST LUMPECTOMY WITH RADIOACTIVE SEEDS LOCALIZATION;  Surgeon: Vanderbilt Ned, MD;  Location: [facility] OR;  Service: General;  Laterality: Right;   CESAREAN SECTION  1989   CHOLECYSTECTOMY  20 yrs ago   COLECTOMY  2011   COLONOSCOPY     COLONOSCOPY N/A 09/05/2018   Procedure: COLONOSCOPY;  Surgeon: Burnette Fallow, MD;  Location: [facility];  Service: Endoscopy;  Laterality: N/A;   COLONOSCOPY WITH PROPOFOL  Bilateral 05/04/2022   Procedure: COLONOSCOPY  WITH PROPOFOL ;  Surgeon: Burnette Fallow, MD;  Location: [facility];  Service: Gastroenterology;  Laterality: Bilateral;   DILATATION & CURETTAGE/HYSTEROSCOPY WITH MYOSURE N/A 05/06/2016   Procedure: DILATATION & CURETTAGE/HYSTEROSCOPY WITH MYOSURE WITH POLYPECTOMY;  Surgeon: Delon Prude, DO;  Location: [facility] ORS;  Service: Gynecology;  Laterality: N/A;   ESOPHAGOGASTRODUODENOSCOPY (EGD) WITH PROPOFOL  N/A 09/04/2013   Procedure: ESOPHAGOGASTRODUODENOSCOPY (EGD) WITH PROPOFOL ;  Surgeon: Fallow Burnette, MD;  Location: [facility];  Service: Endoscopy;  Laterality: N/A;   ESOPHAGOGASTRODUODENOSCOPY (EGD) WITH PROPOFOL  Bilateral 05/04/2022   Procedure: ESOPHAGOGASTRODUODENOSCOPY (EGD) WITH PROPOFOL ;  Surgeon: Burnette Fallow, MD;  Location: [facility];  Service: Gastroenterology;  Laterality: Bilateral;   IR EMBO ART  VEN HEMORR LYMPH EXTRAV  INC GUIDE ROADMAPPING  06/30/2023   IR INTRAVASCULAR ULTRASOUND NON CORONARY  12/23/2022   IR RADIOLOGIST EVAL & MGMT  09/21/2022   IR RADIOLOGIST EVAL & MGMT  10/06/2022   IR RADIOLOGIST EVAL & MGMT  11/02/2022   IR RADIOLOGIST EVAL & MGMT  01/31/2023   IR RADIOLOGIST EVAL & MGMT  05/31/2023   IR RADIOLOGIST EVAL & MGMT  08/21/2023   IR RADIOLOGIST EVAL & MGMT  12/25/2023   IR TIPS  12/23/2022   IR TIPS  06/30/2023   IR US  GUIDE VASC ACCESS LEFT  06/30/2023  IR US  GUIDE VASC ACCESS RIGHT  12/23/2022   IR US  GUIDE VASC ACCESS RIGHT  06/30/2023   IR US  GUIDE VASC ACCESS RIGHT  06/30/2023   KNEE ARTHROSCOPY  yrs ago   bilateral, one done x 1, one done twice   POLYPECTOMY  09/05/2018   Procedure: POLYPECTOMY;  Surgeon: Burnette Fallow, MD;  Location: [facility];  Service: Endoscopy;;   RADIOLOGY WITH ANESTHESIA N/A 12/23/2022   Procedure: TIPS;  Surgeon: Jennefer Ester PARAS, MD;  Location: [facility] OR;  Service: Radiology;  Laterality: N/A;   RIGHT HEART CATH N/A 05/19/2023   Procedure: RIGHT HEART CATH;  Surgeon: Gardenia Led, DO;  Location: [facility];  Service:  Cardiovascular;  Laterality: N/A;   TIPS PROCEDURE N/A 06/30/2023   Procedure: TRANS-JUGULAR INTRAHEPATIC PORTAL SHUNT (TIPS);  Surgeon: Jennefer Ester PARAS, MD;  Location: [facility] OR;  Service: Radiology;  Laterality: N/A;   Patient Active Problem List   Diagnosis Date Noted   CHF (congestive heart failure) (HCC) 02/02/2024   Hypoxia 07/04/2023   Nausea & vomiting 07/04/2023   Nausea and vomiting 07/03/2023   Intractable nausea and vomiting 04/07/2023   Intractable vomiting with nausea 04/06/2023   Acute on chronic diastolic heart failure (HCC) 03/06/2023   Chronic hypotension 03/06/2023   Sepsis due to cellulitis (HCC) 03/05/2023   Severe sepsis (HCC) 03/05/2023   Portal hypertension (HCC) 12/23/2022   Acute encephalopathy 01/10/2021   Secondary hypercoagulable state (HCC) 06/23/2020   Streptococcal bacteremia 03/16/2020   Chronic venous stasis dermatitis of both lower extremities 03/16/2020   Atrial fibrillation with RVR (HCC) 04/26/2018   Acute lower UTI 04/26/2018   Liver cirrhosis secondary to NASH (HCC) 04/26/2018   Hyperlipidemia 04/26/2018   Type 2 diabetes mellitus (HCC)    Cellulitis of right lower extremity 03/15/2018   Sepsis (HCC) 03/14/2018   Community acquired pneumonia of right lower lobe of lung 10/31/2016   Muscular abdominal pain in right upper quadrant 07/11/2016   Chronic heart failure with preserved ejection fraction (HFpEF) (HCC) 07/10/2016   Thrombocytopenia (HCC) 07/10/2016   Permanent atrial fibrillation (HCC) 01/30/2016   Type 2 diabetes mellitus without complication, without long-term current use of insulin  (HCC) 01/30/2016   Morbid obesity- 12/10/2015   Cellulitis, abdominal wall    Melena 01/31/2015   Aortic heart murmur 01/31/2015   Anemia 07/23/2013   Obstructive sleep apnea 08/07/2007   Essential hypertension 08/07/2007   Allergic rhinitis 08/07/2007    ONSET DATE: 02/07/2024 (MD referral)  REFERRING DIAG: Repeated falls (R29.6);Muscle weakness  (generalized) (M62.81);History of falling (Z91.81)  THERAPY DIAG:  No diagnosis found.  Rationale for Evaluation and Treatment: Rehabilitation  SUBJECTIVE:  SUBJECTIVE STATEMENT: Just monitoring BP and HR-HR has been low and BP was low.  Shoulders still hurting from helping husband when he had the diabetic event.  Pt accompanied by: self  PERTINENT HISTORY: ED visit with pneumonia and anemia and hospitalized 02/02/24-02/07/24; PMH of NASH Liver cirrhosis with gastro-esophageal varices, A fib , pancytopenia, CKD 3B b/l bun/creat 21/1.6,severe morbid obesity, A-fib not on anticoagulation, diabetes, hypertension presented to ED 02/02/24 with shortness of breath and upper respiratory symptoms x 1 week  PAIN:  Are you having pain? Yes: NPRS scale: 7-8/10 Pain location: neck, shoulders Pain description: tight Aggravating factors: movement Relieving factors: nothing  PRECAUTIONS: Fall  RED FLAGS: None   WEIGHT BEARING RESTRICTIONS: No  FALLS: Has patient fallen in last 6 months? No  LIVING ENVIRONMENT: Lives with: lives with their spouse Lives in: House/apartment Stairs: 1 step into garage Has following equipment at home: Single point cane and Environmental consultant - 4 wheeled  PLOF: Independent with household mobility with device and Independent with community mobility with device  PATIENT GOALS: Want to be more steady on balance; would like to be able to just use cane  OBJECTIVE:     TODAY'S TREATMENT: 04/10/24 Activity Comments                        HOME EXERCISE PROGRAM Access Code: [email or web address] URL: [email or web address] Date: 04/01/2024 Prepared by: [facility] - Outpatient  Rehab - [facility]  Exercises - Seated Shoulder Circles  - 1 x daily - 5 x weekly - 2 sets - 10  reps - Seated Upper Trapezius Stretch  - 1 x daily - 5 x weekly - 2 sets - 3 reps - 15 sec hold - Seated Scapular Retraction  - 1 x daily - 5 x weekly - 2 sets - 10 reps - 3 sec hold - Sit to Stand with Arms Crossed  - 1 x daily - 5 x weekly - 2 sets - 10 reps - Sitting Knee Extension with Resistance  - 1 x daily - 5 x weekly - 2 sets - 10 reps - Standing Near Stance in Corner with Eyes Closed  - 1 x daily - 5 x weekly - 2 sets - 30 sec hold - Seated Hip Abduction with Resistance  - 1 x daily - 7 x weekly - 3 sets - 10 reps    Note: Objective measures were completed at Evaluation unless otherwise noted.  DIAGNOSTIC FINDINGS: NA for this episode  COGNITION: Overall cognitive status: Within functional limits for tasks assessed   SENSATION: Light touch: WFL  COORDINATION: Alt toe tapping WFL   POSTURE: rounded shoulders and posterior pelvic tilt  LOWER EXTREMITY ROM:   WFL  Active  Right Eval Left Eval  Hip flexion    Hip extension    Hip abduction    Hip adduction    Hip internal rotation    Hip external rotation    Knee flexion    Knee extension    Ankle dorsiflexion    Ankle plantarflexion    Ankle inversion    Ankle eversion     (Blank rows = not tested)  LOWER EXTREMITY MMT:    MMT Right Eval Left Eval  Hip flexion 4 4  Hip extension    Hip abduction    Hip adduction    Hip internal rotation    Hip external rotation    Knee flexion 4+ 4+  Knee extension 4+ 4+  Ankle dorsiflexion  Ankle plantarflexion    Ankle inversion    Ankle eversion    (Blank rows = not tested)  TRANSFERS: Sit to stand: Modified independence  Assistive device utilized: None     Stand to sit: Modified independence  Assistive device utilized: None       GAIT: Findings: Gait Characteristics: step through pattern, trendelenburg, and wide BOS, Distance walked: 50 ft x 2, Assistive device utilized:Single point cane and Walker - 2 wheeled, Level of assistance: CGA, and Comments:  Increased unsteadiness with cane  FUNCTIONAL TESTS:  5 times sit to stand: 15.62 sec Timed up and go (TUG): NT 10 meter walk test: 13.19 sec with rollator (2.49 ft/sec); 16.69 sec with cane (1.97 ft/sec) Lars:  33/56 Pre-test vitals:  98%, 91 HR                                                                                                                               TREATMENT DATE: 03/06/2024    PATIENT EDUCATION: Education details: Eval results, POC Person educated: Patient Education method: Explanation Education comprehension: verbalized understanding  HOME EXERCISE PROGRAM: Not yet initiated  GOALS: Goals reviewed with patient? Yes  SHORT TERM GOALS: Target date: 04/05/2024  Pt will be independent with HEP for improved balance, strength, gait. Baseline: Goal status: MET, per report 04/01/2024  2.  Pt will improve 5x sit<>stand to less than or equal to 12 sec to demonstrate improved functional strength and transfer efficiency. Baseline: 15.62 sec>18 sec 04/01/2024 Goal status: IN PROGRESS, 04/01/2024  3.  Pt will improve Berg score to at least 41/56 to decrease fall risk. Baseline: 33/56>39/56 04/01/2024 Goal status: PARTIALLY MET, 04/01/2024   LONG TERM GOALS: Target date: 05/03/2024  Pt will be independent with HEP for improved balance, strength, gait. Baseline:  Goal status: IN PROGRESS  2.  Pt will improve gait velocity to at least 2.3 ft/sec with cane for improved gait efficiency and safety. Baseline: 1.97 ft/sec with cane Goal status: IN PROGRESS  3.  Pt will improve Berg score to at least 48/56 to decrease fall risk. Baseline: 33/56 Goal status: IN PROGRESS  4.  Pt will verbalize understanding of fall prevention in home environment.  Baseline:  Goal status: IN PROGRESS  ASSESSMENT:  CLINICAL IMPRESSION: Pt presents today and reports some fluctuations in BP and HR recently.  Vitals measured throughout session, and pt appears with slightly elevated  resting HR, but remains similar throughout session.  She requires frequent rest breaks due to fatigue, but vitals remain WFL. Assessed STGs today and pt has met STG 1; she has not met STG 2 for FTSTS, and STG 3 is partially met with improved Berg score, just not to goal level.  Berg score of 39/56 is improved from 33/56, but pt does remain at increased fall risk.  She does note being more off balance laterally with narrowed BOS and with attempt at [facility]; began to address with hip strengthening in sitting.  Pt will continue to benefit from skilled PT towards goals for improved functional mobility and decreased fall risk.   OBJECTIVE IMPAIRMENTS: Abnormal gait, decreased activity tolerance, decreased balance, decreased endurance, decreased knowledge of use of DME, decreased mobility, difficulty walking, decreased strength, and postural dysfunction.   ACTIVITY LIMITATIONS: standing, transfers, and locomotion level  PARTICIPATION LIMITATIONS: meal prep, cleaning, laundry, shopping, and community activity  PERSONAL FACTORS: 3+ comorbidities: See PMH above are also affecting patient's functional outcome.   REHAB POTENTIAL: Good  CLINICAL DECISION MAKING: Evolving/moderate complexity  EVALUATION COMPLEXITY: Moderate  PLAN:  PT FREQUENCY: 1-2x/week  PT DURATION: 8 weeks plus eval  PLANNED INTERVENTIONS: 97750- Physical Performance Testing, 97110-Therapeutic exercises, 97530- Therapeutic activity, 97112- Neuromuscular re-education, 97535- Self Care, [id number]- Manual therapy, [phone]- Gait training, Patient/Family education, and Balance training  PLAN FOR NEXT SESSION: Review additions to HEP, work to improve balance-consider alternating step taps, side steps;  progress lower extremity strengthening.  May need to consider adding more appointments    Greig Anon, PT 04/09/24 11:32 AM Phone: [phone] Fax: [phone]   [facility] at [facility] [address] [place],  [address] [place], [place] [address] Phone # [phone] Fax # [phone]

## 2024-04-10 ENCOUNTER — Ambulatory Visit: Admitting: Physical Therapy

## 2024-04-13 ENCOUNTER — Other Ambulatory Visit (HOSPITAL_COMMUNITY): Payer: Self-pay | Admitting: Cardiology

## 2024-04-17 ENCOUNTER — Ambulatory Visit

## 2024-04-17 DIAGNOSIS — M542 Cervicalgia: Secondary | ICD-10-CM | POA: Diagnosis not present

## 2024-04-17 DIAGNOSIS — E871 Hypo-osmolality and hyponatremia: Secondary | ICD-10-CM | POA: Diagnosis present

## 2024-04-17 DIAGNOSIS — Z1152 Encounter for screening for COVID-19: Secondary | ICD-10-CM | POA: Diagnosis not present

## 2024-04-17 DIAGNOSIS — R2681 Unsteadiness on feet: Secondary | ICD-10-CM

## 2024-04-17 DIAGNOSIS — Z794 Long term (current) use of insulin: Secondary | ICD-10-CM | POA: Diagnosis not present

## 2024-04-17 DIAGNOSIS — K766 Portal hypertension: Secondary | ICD-10-CM | POA: Diagnosis present

## 2024-04-17 DIAGNOSIS — I8510 Secondary esophageal varices without bleeding: Secondary | ICD-10-CM | POA: Diagnosis present

## 2024-04-17 DIAGNOSIS — I4891 Unspecified atrial fibrillation: Secondary | ICD-10-CM | POA: Diagnosis not present

## 2024-04-17 DIAGNOSIS — D649 Anemia, unspecified: Secondary | ICD-10-CM | POA: Diagnosis not present

## 2024-04-17 DIAGNOSIS — I5032 Chronic diastolic (congestive) heart failure: Secondary | ICD-10-CM | POA: Diagnosis present

## 2024-04-17 DIAGNOSIS — K7460 Unspecified cirrhosis of liver: Secondary | ICD-10-CM | POA: Diagnosis present

## 2024-04-17 DIAGNOSIS — E872 Acidosis, unspecified: Secondary | ICD-10-CM | POA: Diagnosis present

## 2024-04-17 DIAGNOSIS — J181 Lobar pneumonia, unspecified organism: Secondary | ICD-10-CM | POA: Diagnosis not present

## 2024-04-17 DIAGNOSIS — R6520 Severe sepsis without septic shock: Secondary | ICD-10-CM | POA: Diagnosis present

## 2024-04-17 DIAGNOSIS — N179 Acute kidney failure, unspecified: Secondary | ICD-10-CM | POA: Diagnosis present

## 2024-04-17 DIAGNOSIS — M79605 Pain in left leg: Secondary | ICD-10-CM | POA: Diagnosis not present

## 2024-04-17 DIAGNOSIS — D696 Thrombocytopenia, unspecified: Secondary | ICD-10-CM | POA: Diagnosis present

## 2024-04-17 DIAGNOSIS — J90 Pleural effusion, not elsewhere classified: Secondary | ICD-10-CM | POA: Diagnosis not present

## 2024-04-17 DIAGNOSIS — Z8719 Personal history of other diseases of the digestive system: Secondary | ICD-10-CM | POA: Diagnosis not present

## 2024-04-17 DIAGNOSIS — A411 Sepsis due to other specified staphylococcus: Secondary | ICD-10-CM | POA: Diagnosis present

## 2024-04-17 DIAGNOSIS — I350 Nonrheumatic aortic (valve) stenosis: Secondary | ICD-10-CM | POA: Diagnosis present

## 2024-04-17 DIAGNOSIS — N1832 Chronic kidney disease, stage 3b: Secondary | ICD-10-CM | POA: Diagnosis present

## 2024-04-17 DIAGNOSIS — L539 Erythematous condition, unspecified: Secondary | ICD-10-CM | POA: Diagnosis not present

## 2024-04-17 DIAGNOSIS — B957 Other staphylococcus as the cause of diseases classified elsewhere: Secondary | ICD-10-CM | POA: Diagnosis not present

## 2024-04-17 DIAGNOSIS — R0602 Shortness of breath: Secondary | ICD-10-CM | POA: Diagnosis not present

## 2024-04-17 DIAGNOSIS — K7581 Nonalcoholic steatohepatitis (NASH): Secondary | ICD-10-CM | POA: Diagnosis present

## 2024-04-17 DIAGNOSIS — R0989 Other specified symptoms and signs involving the circulatory and respiratory systems: Secondary | ICD-10-CM | POA: Diagnosis not present

## 2024-04-17 DIAGNOSIS — R011 Cardiac murmur, unspecified: Secondary | ICD-10-CM | POA: Diagnosis not present

## 2024-04-17 DIAGNOSIS — N189 Chronic kidney disease, unspecified: Secondary | ICD-10-CM | POA: Diagnosis not present

## 2024-04-17 DIAGNOSIS — J189 Pneumonia, unspecified organism: Secondary | ICD-10-CM | POA: Diagnosis not present

## 2024-04-17 DIAGNOSIS — E1122 Type 2 diabetes mellitus with diabetic chronic kidney disease: Secondary | ICD-10-CM | POA: Diagnosis present

## 2024-04-17 DIAGNOSIS — R918 Other nonspecific abnormal finding of lung field: Secondary | ICD-10-CM | POA: Diagnosis not present

## 2024-04-17 DIAGNOSIS — I130 Hypertensive heart and chronic kidney disease with heart failure and stage 1 through stage 4 chronic kidney disease, or unspecified chronic kidney disease: Secondary | ICD-10-CM | POA: Diagnosis present

## 2024-04-17 DIAGNOSIS — L988 Other specified disorders of the skin and subcutaneous tissue: Secondary | ICD-10-CM | POA: Diagnosis not present

## 2024-04-17 DIAGNOSIS — Z6841 Body Mass Index (BMI) 40.0 and over, adult: Secondary | ICD-10-CM | POA: Diagnosis not present

## 2024-04-17 DIAGNOSIS — D61818 Other pancytopenia: Secondary | ICD-10-CM | POA: Diagnosis present

## 2024-04-17 DIAGNOSIS — R2689 Other abnormalities of gait and mobility: Secondary | ICD-10-CM

## 2024-04-17 DIAGNOSIS — R7881 Bacteremia: Secondary | ICD-10-CM | POA: Diagnosis not present

## 2024-04-17 DIAGNOSIS — K661 Hemoperitoneum: Secondary | ICD-10-CM | POA: Diagnosis not present

## 2024-04-17 DIAGNOSIS — M6281 Muscle weakness (generalized): Secondary | ICD-10-CM

## 2024-04-17 DIAGNOSIS — D509 Iron deficiency anemia, unspecified: Secondary | ICD-10-CM | POA: Diagnosis not present

## 2024-04-17 DIAGNOSIS — L03116 Cellulitis of left lower limb: Secondary | ICD-10-CM | POA: Diagnosis present

## 2024-04-17 DIAGNOSIS — A419 Sepsis, unspecified organism: Secondary | ICD-10-CM | POA: Diagnosis not present

## 2024-04-17 DIAGNOSIS — D62 Acute posthemorrhagic anemia: Secondary | ICD-10-CM | POA: Diagnosis present

## 2024-04-17 DIAGNOSIS — E1165 Type 2 diabetes mellitus with hyperglycemia: Secondary | ICD-10-CM | POA: Diagnosis present

## 2024-04-17 DIAGNOSIS — M79659 Pain in unspecified thigh: Secondary | ICD-10-CM | POA: Diagnosis not present

## 2024-04-17 DIAGNOSIS — I4821 Permanent atrial fibrillation: Secondary | ICD-10-CM | POA: Diagnosis present

## 2024-04-17 NOTE — Therapy (Addendum)
 OUTPATIENT PHYSICAL THERAPY NEURO TREATMENT   Patient Name: Hannah Khan MRN: [id number] DOB:[birth year], 68 y.o., female Today's Date: 04/17/2024   PCP: Regino Slater, MD REFERRING PROVIDER: Christobal Guadalajara, MD (Hospitalist)>f/u with Regino Slater, MD   END OF SESSION:  PT End of Session - 04/17/24 1406     Visit Number 5    Number of Visits 17    Date for PT Re-Evaluation 05/03/24    Authorization Type [name]    Authorization Time Period approved 17 PT visits from 03/06/2024-05/03/2024    Authorization - Visit Number 5    Authorization - Number of Visits 17    Progress Note Due on Visit 10    PT Start Time 1406    PT Stop Time 1445    PT Time Calculation (min) 39 min    Activity Tolerance Patient tolerated treatment well    Behavior During Therapy WFL for tasks assessed/performed            Past Medical History:  Diagnosis Date   Anemia    hx of   Aortic stenosis 02/2023   Echo 03/07/23 LVEF 55-60%, mild to moderate AS   Arthritis    knees   CHF (congestive heart failure) (HCC)    chronic diastolic   Diabetes mellitus without complication (HCC)    type 2   Dyspnea    Dysrhythmia    Fibromyalgia    Gastric varices    large   H/O transfusion of platelets    Heart murmur    never has caused any problems   Hx of colonic polyps    s/p partial colectomy   Hyperlipidemia    Hypertension    Joint pain    in knees and back spasms   Left leg swelling    wear compression hose   Liver cirrhosis secondary to NASH (nonalcoholic steatohepatitis) (HCC) dx nov 2014   had enlarged spleen also    Morbid obesity (HCC)    Persistent atrial fibrillation (HCC)    Pneumonia 10/2016   x 5   PONV (postoperative nausea and vomiting)    in past none recent   Portal hypertension (HCC)    Sleep apnea    uses cpap, setting varies between 14-20   Spleen enlarged    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 [facility])    Past Surgical History:  Procedure Laterality Date    BREAST LUMPECTOMY WITH RADIOACTIVE SEED LOCALIZATION Right 08/29/2017   Procedure: RIGHT BREAST LUMPECTOMY WITH RADIOACTIVE SEEDS LOCALIZATION;  Surgeon: Vanderbilt Ned, MD;  Location: [facility] OR;  Service: General;  Laterality: Right;   CESAREAN SECTION  1989   CHOLECYSTECTOMY  20 yrs ago   COLECTOMY  2011   COLONOSCOPY     COLONOSCOPY N/A 09/05/2018   Procedure: COLONOSCOPY;  Surgeon: Burnette Fallow, MD;  Location: [facility];  Service: Endoscopy;  Laterality: N/A;   COLONOSCOPY WITH PROPOFOL  Bilateral 05/04/2022   Procedure: COLONOSCOPY WITH PROPOFOL ;  Surgeon: Burnette Fallow, MD;  Location: [facility];  Service: Gastroenterology;  Laterality: Bilateral;   DILATATION & CURETTAGE/HYSTEROSCOPY WITH MYOSURE N/A 05/06/2016   Procedure: DILATATION & CURETTAGE/HYSTEROSCOPY WITH MYOSURE WITH POLYPECTOMY;  Surgeon: Delon Prude, DO;  Location: [facility];  Service: Gynecology;  Laterality: N/A;   ESOPHAGOGASTRODUODENOSCOPY (EGD) WITH PROPOFOL  N/A 09/04/2013   Procedure: ESOPHAGOGASTRODUODENOSCOPY (EGD) WITH PROPOFOL ;  Surgeon: Fallow Burnette, MD;  Location: [facility];  Service: Endoscopy;  Laterality: N/A;   ESOPHAGOGASTRODUODENOSCOPY (EGD) WITH PROPOFOL  Bilateral 05/04/2022   Procedure: ESOPHAGOGASTRODUODENOSCOPY (EGD) WITH PROPOFOL ;  Surgeon:  Burnette Fallow, MD;  Location: [facility];  Service: Gastroenterology;  Laterality: Bilateral;   IR EMBO ART  VEN HEMORR LYMPH EXTRAV  INC GUIDE ROADMAPPING  06/30/2023   IR INTRAVASCULAR ULTRASOUND NON CORONARY  12/23/2022   IR RADIOLOGIST EVAL & MGMT  09/21/2022   IR RADIOLOGIST EVAL & MGMT  10/06/2022   IR RADIOLOGIST EVAL & MGMT  11/02/2022   IR RADIOLOGIST EVAL & MGMT  01/31/2023   IR RADIOLOGIST EVAL & MGMT  05/31/2023   IR RADIOLOGIST EVAL & MGMT  08/21/2023   IR RADIOLOGIST EVAL & MGMT  12/25/2023   IR TIPS  12/23/2022   IR TIPS  06/30/2023   IR US  GUIDE VASC ACCESS LEFT  06/30/2023   IR US  GUIDE VASC ACCESS RIGHT  12/23/2022   IR US  GUIDE VASC ACCESS  RIGHT  06/30/2023   IR US  GUIDE VASC ACCESS RIGHT  06/30/2023   KNEE ARTHROSCOPY  yrs ago   bilateral, one done x 1, one done twice   POLYPECTOMY  09/05/2018   Procedure: POLYPECTOMY;  Surgeon: Burnette Fallow, MD;  Location: [facility];  Service: Endoscopy;;   RADIOLOGY WITH ANESTHESIA N/A 12/23/2022   Procedure: TIPS;  Surgeon: Jennefer Ester PARAS, MD;  Location: [facility] OR;  Service: Radiology;  Laterality: N/A;   RIGHT HEART CATH N/A 05/19/2023   Procedure: RIGHT HEART CATH;  Surgeon: Gardenia Led, DO;  Location: [facility];  Service: Cardiovascular;  Laterality: N/A;   TIPS PROCEDURE N/A 06/30/2023   Procedure: TRANS-JUGULAR INTRAHEPATIC PORTAL SHUNT (TIPS);  Surgeon: Jennefer Ester PARAS, MD;  Location: [facility] OR;  Service: Radiology;  Laterality: N/A;   Patient Active Problem List   Diagnosis Date Noted   CHF (congestive heart failure) (HCC) 02/02/2024   Hypoxia 07/04/2023   Nausea & vomiting 07/04/2023   Nausea and vomiting 07/03/2023   Intractable nausea and vomiting 04/07/2023   Intractable vomiting with nausea 04/06/2023   Acute on chronic diastolic heart failure (HCC) 03/06/2023   Chronic hypotension 03/06/2023   Sepsis due to cellulitis (HCC) 03/05/2023   Severe sepsis (HCC) 03/05/2023   Portal hypertension (HCC) 12/23/2022   Acute encephalopathy 01/10/2021   Secondary hypercoagulable state (HCC) 06/23/2020   Streptococcal bacteremia 03/16/2020   Chronic venous stasis dermatitis of both lower extremities 03/16/2020   Atrial fibrillation with RVR (HCC) 04/26/2018   Acute lower UTI 04/26/2018   Liver cirrhosis secondary to NASH (HCC) 04/26/2018   Hyperlipidemia 04/26/2018   Type 2 diabetes mellitus (HCC)    Cellulitis of right lower extremity 03/15/2018   Sepsis (HCC) 03/14/2018   Community acquired pneumonia of right lower lobe of lung 10/31/2016   Muscular abdominal pain in right upper quadrant 07/11/2016   Chronic heart failure with preserved ejection fraction (HFpEF)  (HCC) 07/10/2016   Thrombocytopenia (HCC) 07/10/2016   Permanent atrial fibrillation (HCC) 01/30/2016   Type 2 diabetes mellitus without complication, without long-term current use of insulin  (HCC) 01/30/2016   Morbid obesity- 12/10/2015   Cellulitis, abdominal wall    Melena 01/31/2015   Aortic heart murmur 01/31/2015   Anemia 07/23/2013   Obstructive sleep apnea 08/07/2007   Essential hypertension 08/07/2007   Allergic rhinitis 08/07/2007    ONSET DATE: 02/07/2024 (MD referral)  REFERRING DIAG: Repeated falls (R29.6);Muscle weakness (generalized) (M62.81);History of falling (Z91.81)  THERAPY DIAG:  Muscle weakness (generalized)  Unsteadiness on feet  Other abnormalities of gait and mobility  Rationale for Evaluation and Treatment: Rehabilitation  SUBJECTIVE:  SUBJECTIVE STATEMENT: Still having a lot of neck and shoulder pain from straining to assist husband. Can walk a little better but still dizzy/lightheaded when arising  Pt accompanied by: self  PERTINENT HISTORY: ED visit with pneumonia and anemia and hospitalized 02/02/24-02/07/24; PMH of NASH Liver cirrhosis with gastro-esophageal varices, A fib , pancytopenia, CKD 3B b/l bun/creat 21/1.6,severe morbid obesity, A-fib not on anticoagulation, diabetes, hypertension presented to ED 02/02/24 with shortness of breath and upper respiratory symptoms x 1 week  PAIN:  Are you having pain? Yes: NPRS scale: 9/10 Pain location: neck, shoulders Pain description: tight Aggravating factors: movement Relieving factors: nothing  PRECAUTIONS: Fall  RED FLAGS: None   WEIGHT BEARING RESTRICTIONS: No  FALLS: Has patient fallen in last 6 months? No  LIVING ENVIRONMENT: Lives with: lives with their spouse Lives in: House/apartment Stairs: 1 step  into garage Has following equipment at home: Single point cane and Environmental consultant - 4 wheeled  PLOF: Independent with household mobility with device and Independent with community mobility with device  PATIENT GOALS: Want to be more steady on balance; would like to be able to just use cane  OBJECTIVE:   TODAY'S TREATMENT: 04/17/24 Activity Comments  Soft tissue mobilization -trigger points throughout bilat upper trap, scalenes, subocciput w/ moblization to reduce pain/guarding and subocciptal and manual traction to cervical spine.   Shoulder ER 2x10 Supine w/ red band  LE AROM 2x10 To reduce lightheadedness with change of position  Seated upper trap stretch Emphasis on shoulder depression  6/10 post-tx pain          TODAY'S TREATMENT: 04/01/2024 Activity Comments  Vitals: HR 106 bpm; O2 98% 100/58 HR 96 bpm-rechecked Manual cuff for BP  FTSTS:  18.78 sec   Berg Balance:  39/56 Improved from 33/56  Verbal review of HEP Verbalizes understanding  Sitting hip abduction/clamshell  Sitting step out/in 2 x 10, green theraband  Vitals:  98% and HR 103-113 bpm        HOME EXERCISE PROGRAM Access Code: [email or web address] URL: [email or web address] Date: 04/01/2024 Prepared by: [facility] - Outpatient  Rehab - [facility]  Exercises - Seated Shoulder Circles  - 1 x daily - 5 x weekly - 2 sets - 10 reps - Seated Upper Trapezius Stretch  - 1 x daily - 5 x weekly - 2 sets - 3 reps - 15 sec hold - Seated Scapular Retraction  - 1 x daily - 5 x weekly - 2 sets - 10 reps - 3 sec hold - Sit to Stand with Arms Crossed  - 1 x daily - 5 x weekly - 2 sets - 10 reps - Sitting Knee Extension with Resistance  - 1 x daily - 5 x weekly - 2 sets - 10 reps - Standing Near Stance in Corner with Eyes Closed  - 1 x daily - 5 x weekly - 2 sets - 30 sec hold - Seated Hip Abduction with Resistance  - 1 x daily - 7 x weekly - 3 sets - 10 reps  TREATMENT: 03/25/24 Activity Comments  LAQ 3# 2x10 each  Good  TKE; also demonstrated how to perform this with yellow TB loop  STS 2x10 without UE support Cueing to stand fully to engage glutes   mini squat 10x PT holding pt's walker still for safety d/t broken brake. Encouraged this at counter top at home instead. Good effort to perform slight hip hinge  romberg 2x30 Mild-mod sway. Pt reports starting to feel dizzy when she exhibits imbalance.  Performed at TM rail for safety  romberg + head turns/nods Report of dizziness with head turns. Performed at TM rail for safety     HOME EXERCISE PROGRAM Access Code: [email or web address] URL: [email or web address] Date: 03/25/2024 Prepared by: [facility] - Outpatient  Rehab - [facility]  Exercises - Seated Shoulder Circles  - 1 x daily - 5 x weekly - 2 sets - 10 reps - Seated Upper Trapezius Stretch  - 1 x daily - 5 x weekly - 2 sets - 3 reps - 15 sec hold - Seated Scapular Retraction  - 1 x daily - 5 x weekly - 2 sets - 10 reps - 3 sec hold - Sit to Stand with Arms Crossed  - 1 x daily - 5 x weekly - 2 sets - 10 reps - Sitting Knee Extension with Resistance  - 1 x daily - 5 x weekly - 2 sets - 10 reps - Standing Near Stance in Corner with Eyes Closed  - 1 x daily - 5 x weekly - 2 sets - 30 sec hold - Shoulder External Rotation and Scapular Retraction with Resistance  - 1 x daily - 7 x weekly - 3 sets - 10 reps   PATIENT EDUCATION: Education details: HEP update, progress towards goals Person educated: Patient Education method: Explanation, Demonstration, Tactile cues, Verbal cues, and Handouts Education comprehension: verbalized understanding and returned demonstration    Note: Objective measures were completed at Evaluation unless otherwise noted.  DIAGNOSTIC FINDINGS: NA for this episode  COGNITION: Overall cognitive status: Within functional limits for tasks assessed   SENSATION: Light touch: WFL  COORDINATION: Alt toe tapping WFL   POSTURE: rounded shoulders and posterior pelvic  tilt  LOWER EXTREMITY ROM:   WFL  Active  Right Eval Left Eval  Hip flexion    Hip extension    Hip abduction    Hip adduction    Hip internal rotation    Hip external rotation    Knee flexion    Knee extension    Ankle dorsiflexion    Ankle plantarflexion    Ankle inversion    Ankle eversion     (Blank rows = not tested)  LOWER EXTREMITY MMT:    MMT Right Eval Left Eval  Hip flexion 4 4  Hip extension    Hip abduction    Hip adduction    Hip internal rotation    Hip external rotation    Knee flexion 4+ 4+  Knee extension 4+ 4+  Ankle dorsiflexion    Ankle plantarflexion    Ankle inversion    Ankle eversion    (Blank rows = not tested)  TRANSFERS: Sit to stand: Modified independence  Assistive device utilized: None     Stand to sit: Modified independence  Assistive device utilized: None       GAIT: Findings: Gait Characteristics: step through pattern, trendelenburg, and wide BOS, Distance walked: 50 ft x 2, Assistive device utilized:Single point cane and Walker - 2 wheeled, Level of assistance: CGA, and Comments: Increased unsteadiness with cane  FUNCTIONAL TESTS:  5 times sit to stand: 15.62 sec Timed up and go (TUG): NT 10 meter walk test: 13.19 sec with rollator (2.49 ft/sec); 16.69 sec with cane (1.97 ft/sec) Berg:  33/56 Pre-test vitals:  98%, 91 HR  TREATMENT DATE: 03/06/2024    PATIENT EDUCATION: Education details: Eval results, POC Person educated: Patient Education method: Explanation Education comprehension: verbalized understanding  HOME EXERCISE PROGRAM: Not yet initiated  GOALS: Goals reviewed with patient? Yes  SHORT TERM GOALS: Target date: 04/05/2024  Pt will be independent with HEP for improved balance, strength, gait. Baseline: Goal status: MET, per report 04/01/2024  2.  Pt will improve 5x sit<>stand  to less than or equal to 12 sec to demonstrate improved functional strength and transfer efficiency. Baseline: 15.62 sec>18 sec 04/01/2024 Goal status: IN PROGRESS, 04/01/2024  3.  Pt will improve Berg score to at least 41/56 to decrease fall risk. Baseline: 33/56>39/56 04/01/2024 Goal status: PARTIALLY MET, 04/01/2024   LONG TERM GOALS: Target date: 05/03/2024  Pt will be independent with HEP for improved balance, strength, gait. Baseline:  Goal status: IN PROGRESS  2.  Pt will improve gait velocity to at least 2.3 ft/sec with cane for improved gait efficiency and safety. Baseline: 1.97 ft/sec with cane Goal status: IN PROGRESS  3.  Pt will improve Berg score to at least 48/56 to decrease fall risk. Baseline: 33/56 Goal status: IN PROGRESS  4.  Pt will verbalize understanding of fall prevention in home environment.  Baseline:  Goal status: IN PROGRESS  ASSESSMENT:  CLINICAL IMPRESSION: Continuing to have neck shoulder pain/strain limiting activity tolerance and ability to bear weight through AD with UE. Soft tissue mobilization to address pain and soft tissue dysfunction to good tolerance and initiated gentle rotator cuff/scapular strengthening to improve muscular efficiency/endurance to progress tolerance. Gentle strteching to help with restoring ROM and reduce guarding. Continued sessions to progress POC details to improve mobility and reduce risk for falls.   OBJECTIVE IMPAIRMENTS: Abnormal gait, decreased activity tolerance, decreased balance, decreased endurance, decreased knowledge of use of DME, decreased mobility, difficulty walking, decreased strength, and postural dysfunction.   ACTIVITY LIMITATIONS: standing, transfers, and locomotion level  PARTICIPATION LIMITATIONS: meal prep, cleaning, laundry, shopping, and community activity  PERSONAL FACTORS: 3+ comorbidities: See PMH above are also affecting patient's functional outcome.   REHAB POTENTIAL: Good  CLINICAL  DECISION MAKING: Evolving/moderate complexity  EVALUATION COMPLEXITY: Moderate  PLAN:  PT FREQUENCY: 1-2x/week  PT DURATION: 8 weeks plus eval  PLANNED INTERVENTIONS: 97750- Physical Performance Testing, 97110-Therapeutic exercises, 97530- Therapeutic activity, 97112- Neuromuscular re-education, 97535- Self Care, 02859- Manual therapy, [phone]- Gait training, Patient/Family education, and Balance training  PLAN FOR NEXT SESSION: Review additions to HEP, work to improve balance-consider alternating step taps, side steps;  progress lower extremity strengthening.  May need to consider adding more appointments   2:42 PM, 04/17/24 M. Kelly Kwamaine Cuppett, PT, DPT Physical Therapist- [place] Office Number: [phone]

## 2024-04-18 ENCOUNTER — Inpatient Hospital Stay (HOSPITAL_BASED_OUTPATIENT_CLINIC_OR_DEPARTMENT_OTHER)
Admission: EM | Admit: 2024-04-18 | Discharge: 2024-04-24 | DRG: 871 | Disposition: A | Discharge status: Home Health | Attending: Internal Medicine | Admitting: Internal Medicine

## 2024-04-18 ENCOUNTER — Emergency Department (HOSPITAL_BASED_OUTPATIENT_CLINIC_OR_DEPARTMENT_OTHER)

## 2024-04-18 ENCOUNTER — Encounter (HOSPITAL_BASED_OUTPATIENT_CLINIC_OR_DEPARTMENT_OTHER): Payer: Self-pay | Admitting: Emergency Medicine

## 2024-04-18 ENCOUNTER — Other Ambulatory Visit: Payer: Self-pay

## 2024-04-18 DIAGNOSIS — Z888 Allergy status to other drugs, medicaments and biological substances status: Secondary | ICD-10-CM

## 2024-04-18 DIAGNOSIS — Z8249 Family history of ischemic heart disease and other diseases of the circulatory system: Secondary | ICD-10-CM

## 2024-04-18 DIAGNOSIS — Z801 Family history of malignant neoplasm of trachea, bronchus and lung: Secondary | ICD-10-CM

## 2024-04-18 DIAGNOSIS — Z794 Long term (current) use of insulin: Secondary | ICD-10-CM

## 2024-04-18 DIAGNOSIS — Z885 Allergy status to narcotic agent status: Secondary | ICD-10-CM

## 2024-04-18 DIAGNOSIS — M797 Fibromyalgia: Secondary | ICD-10-CM | POA: Diagnosis present

## 2024-04-18 DIAGNOSIS — R6520 Severe sepsis without septic shock: Secondary | ICD-10-CM | POA: Diagnosis present

## 2024-04-18 DIAGNOSIS — M542 Cervicalgia: Secondary | ICD-10-CM | POA: Diagnosis not present

## 2024-04-18 DIAGNOSIS — Z6841 Body Mass Index (BMI) 40.0 and over, adult: Secondary | ICD-10-CM

## 2024-04-18 DIAGNOSIS — Z91013 Allergy to seafood: Secondary | ICD-10-CM

## 2024-04-18 DIAGNOSIS — G4733 Obstructive sleep apnea (adult) (pediatric): Secondary | ICD-10-CM | POA: Diagnosis present

## 2024-04-18 DIAGNOSIS — R0989 Other specified symptoms and signs involving the circulatory and respiratory systems: Secondary | ICD-10-CM | POA: Diagnosis present

## 2024-04-18 DIAGNOSIS — I350 Nonrheumatic aortic (valve) stenosis: Secondary | ICD-10-CM | POA: Diagnosis present

## 2024-04-18 DIAGNOSIS — Z809 Family history of malignant neoplasm, unspecified: Secondary | ICD-10-CM

## 2024-04-18 DIAGNOSIS — R918 Other nonspecific abnormal finding of lung field: Secondary | ICD-10-CM | POA: Diagnosis not present

## 2024-04-18 DIAGNOSIS — I864 Gastric varices: Secondary | ICD-10-CM | POA: Diagnosis present

## 2024-04-18 DIAGNOSIS — E876 Hypokalemia: Secondary | ICD-10-CM

## 2024-04-18 DIAGNOSIS — R0602 Shortness of breath: Secondary | ICD-10-CM | POA: Diagnosis not present

## 2024-04-18 DIAGNOSIS — I4891 Unspecified atrial fibrillation: Secondary | ICD-10-CM | POA: Diagnosis not present

## 2024-04-18 DIAGNOSIS — E1122 Type 2 diabetes mellitus with diabetic chronic kidney disease: Secondary | ICD-10-CM | POA: Diagnosis present

## 2024-04-18 DIAGNOSIS — Z83438 Family history of other disorder of lipoprotein metabolism and other lipidemia: Secondary | ICD-10-CM

## 2024-04-18 DIAGNOSIS — D61818 Other pancytopenia: Secondary | ICD-10-CM | POA: Diagnosis present

## 2024-04-18 DIAGNOSIS — I10 Essential (primary) hypertension: Secondary | ICD-10-CM | POA: Diagnosis present

## 2024-04-18 DIAGNOSIS — Z1152 Encounter for screening for COVID-19: Secondary | ICD-10-CM

## 2024-04-18 DIAGNOSIS — I5032 Chronic diastolic (congestive) heart failure: Secondary | ICD-10-CM | POA: Diagnosis present

## 2024-04-18 DIAGNOSIS — E113593 Type 2 diabetes mellitus with proliferative diabetic retinopathy without macular edema, bilateral: Secondary | ICD-10-CM

## 2024-04-18 DIAGNOSIS — A419 Sepsis, unspecified organism: Secondary | ICD-10-CM | POA: Diagnosis not present

## 2024-04-18 DIAGNOSIS — K661 Hemoperitoneum: Secondary | ICD-10-CM | POA: Diagnosis not present

## 2024-04-18 DIAGNOSIS — G8929 Other chronic pain: Secondary | ICD-10-CM | POA: Diagnosis present

## 2024-04-18 DIAGNOSIS — Z860100 Personal history of colon polyps, unspecified: Secondary | ICD-10-CM

## 2024-04-18 DIAGNOSIS — B957 Other staphylococcus as the cause of diseases classified elsewhere: Secondary | ICD-10-CM | POA: Diagnosis present

## 2024-04-18 DIAGNOSIS — D6959 Other secondary thrombocytopenia: Secondary | ICD-10-CM | POA: Diagnosis present

## 2024-04-18 DIAGNOSIS — N1832 Chronic kidney disease, stage 3b: Secondary | ICD-10-CM | POA: Diagnosis present

## 2024-04-18 DIAGNOSIS — E119 Type 2 diabetes mellitus without complications: Secondary | ICD-10-CM

## 2024-04-18 DIAGNOSIS — D62 Acute posthemorrhagic anemia: Secondary | ICD-10-CM | POA: Diagnosis present

## 2024-04-18 DIAGNOSIS — D649 Anemia, unspecified: Secondary | ICD-10-CM | POA: Diagnosis not present

## 2024-04-18 DIAGNOSIS — E785 Hyperlipidemia, unspecified: Secondary | ICD-10-CM | POA: Diagnosis present

## 2024-04-18 DIAGNOSIS — K7460 Unspecified cirrhosis of liver: Secondary | ICD-10-CM | POA: Diagnosis present

## 2024-04-18 DIAGNOSIS — D696 Thrombocytopenia, unspecified: Secondary | ICD-10-CM | POA: Diagnosis not present

## 2024-04-18 DIAGNOSIS — K766 Portal hypertension: Secondary | ICD-10-CM | POA: Diagnosis present

## 2024-04-18 DIAGNOSIS — I130 Hypertensive heart and chronic kidney disease with heart failure and stage 1 through stage 4 chronic kidney disease, or unspecified chronic kidney disease: Secondary | ICD-10-CM | POA: Diagnosis present

## 2024-04-18 DIAGNOSIS — Z9049 Acquired absence of other specified parts of digestive tract: Secondary | ICD-10-CM

## 2024-04-18 DIAGNOSIS — K3184 Gastroparesis: Secondary | ICD-10-CM | POA: Diagnosis present

## 2024-04-18 DIAGNOSIS — I9589 Other hypotension: Secondary | ICD-10-CM | POA: Diagnosis present

## 2024-04-18 DIAGNOSIS — E871 Hypo-osmolality and hyponatremia: Secondary | ICD-10-CM | POA: Diagnosis present

## 2024-04-18 DIAGNOSIS — E1165 Type 2 diabetes mellitus with hyperglycemia: Secondary | ICD-10-CM | POA: Diagnosis present

## 2024-04-18 DIAGNOSIS — K7581 Nonalcoholic steatohepatitis (NASH): Secondary | ICD-10-CM | POA: Diagnosis present

## 2024-04-18 DIAGNOSIS — M79605 Pain in left leg: Secondary | ICD-10-CM | POA: Diagnosis not present

## 2024-04-18 DIAGNOSIS — A411 Sepsis due to other specified staphylococcus: Principal | ICD-10-CM | POA: Diagnosis present

## 2024-04-18 DIAGNOSIS — I4821 Permanent atrial fibrillation: Secondary | ICD-10-CM | POA: Diagnosis present

## 2024-04-18 DIAGNOSIS — I8510 Secondary esophageal varices without bleeding: Secondary | ICD-10-CM | POA: Diagnosis present

## 2024-04-18 DIAGNOSIS — N179 Acute kidney failure, unspecified: Secondary | ICD-10-CM | POA: Diagnosis present

## 2024-04-18 DIAGNOSIS — Z79899 Other long term (current) drug therapy: Secondary | ICD-10-CM

## 2024-04-18 DIAGNOSIS — E872 Acidosis, unspecified: Secondary | ICD-10-CM | POA: Diagnosis present

## 2024-04-18 DIAGNOSIS — R7881 Bacteremia: Secondary | ICD-10-CM | POA: Diagnosis present

## 2024-04-18 DIAGNOSIS — I358 Other nonrheumatic aortic valve disorders: Secondary | ICD-10-CM | POA: Diagnosis present

## 2024-04-18 DIAGNOSIS — L03116 Cellulitis of left lower limb: Secondary | ICD-10-CM | POA: Diagnosis present

## 2024-04-18 DIAGNOSIS — Z87891 Personal history of nicotine dependence: Secondary | ICD-10-CM

## 2024-04-18 DIAGNOSIS — M79659 Pain in unspecified thigh: Secondary | ICD-10-CM | POA: Diagnosis not present

## 2024-04-18 DIAGNOSIS — Z823 Family history of stroke: Secondary | ICD-10-CM

## 2024-04-18 LAB — CBC WITH DIFFERENTIAL/PLATELET
Abs Granulocyte: 3.7 K/uL (ref 1.5–6.5)
Abs Immature Granulocytes: 0.02 K/uL (ref 0.00–0.07)
Basophils Absolute: 0 K/uL (ref 0.0–0.1)
Basophils Relative: 1 %
Eosinophils Absolute: 0 K/uL (ref 0.0–0.5)
Eosinophils Relative: 0 %
HCT: 18.7 % — ABNORMAL LOW (ref 36.0–46.0)
Hemoglobin: 5.9 g/dL — CL (ref 12.0–15.0)
Immature Granulocytes: 1 %
Lymphocytes Relative: 3 %
Lymphs Abs: 0.1 K/uL — ABNORMAL LOW (ref 0.7–4.0)
MCH: 27.6 pg (ref 26.0–34.0)
MCHC: 31.6 g/dL (ref 30.0–36.0)
MCV: 87.4 fL (ref 80.0–100.0)
Monocytes Absolute: 0.3 K/uL (ref 0.1–1.0)
Monocytes Relative: 8 %
Neutro Abs: 3.7 K/uL (ref 1.7–7.7)
Neutrophils Relative %: 87 %
Platelets: 26 K/uL — CL (ref 150–400)
RBC: 2.14 MIL/uL — ABNORMAL LOW (ref 3.87–5.11)
RDW: 18.6 % — ABNORMAL HIGH (ref 11.5–15.5)
WBC: 4.2 K/uL (ref 4.0–10.5)
nRBC: 0 % (ref 0.0–0.2)

## 2024-04-18 LAB — COMPREHENSIVE METABOLIC PANEL WITH GFR
ALT: 18 U/L (ref 0–44)
AST: 30 U/L (ref 15–41)
Albumin: 3.3 g/dL — ABNORMAL LOW (ref 3.5–5.0)
Alkaline Phosphatase: 82 U/L (ref 38–126)
Anion gap: 12 (ref 5–15)
BUN: 40 mg/dL — ABNORMAL HIGH (ref 8–23)
CO2: 21 mmol/L — ABNORMAL LOW (ref 22–32)
Calcium: 8.9 mg/dL (ref 8.9–10.3)
Chloride: 98 mmol/L (ref 98–111)
Creatinine, Ser: 2.21 mg/dL — ABNORMAL HIGH (ref 0.44–1.00)
GFR, Estimated: 24 mL/min — ABNORMAL LOW (ref >60)
Glucose, Bld: 275 mg/dL — ABNORMAL HIGH (ref 70–99)
Potassium: 4.3 mmol/L (ref 3.5–5.1)
Sodium: 131 mmol/L — ABNORMAL LOW (ref 135–145)
Total Bilirubin: 2.7 mg/dL — ABNORMAL HIGH (ref 0.0–1.2)
Total Protein: 6.1 g/dL — ABNORMAL LOW (ref 6.5–8.1)

## 2024-04-18 LAB — LACTIC ACID, PLASMA
Lactic Acid, Venous: 2.6 mmol/L (ref 0.5–1.9)
Lactic Acid, Venous: 2 mmol/L (ref 0.5–1.9)

## 2024-04-18 LAB — PROTIME-INR
INR: 1.4 — ABNORMAL HIGH (ref 0.8–1.2)
Prothrombin Time: 17.4 s — ABNORMAL HIGH (ref 11.4–15.2)

## 2024-04-18 LAB — DIGOXIN LEVEL: Digoxin Level: 1.5 ng/mL (ref 0.8–2.0)

## 2024-04-18 LAB — OCCULT BLOOD X 1 CARD TO LAB, STOOL: Fecal Occult Bld: NEGATIVE

## 2024-04-18 MED ORDER — DILTIAZEM HCL-DEXTROSE 125-5 MG/125ML-% IV SOLN (PREMIX)
5.0000 mg/h | INTRAVENOUS | Status: DC
  Administered 2024-04-18: 5 mg/h via INTRAVENOUS
  Administered 2024-04-19: 12.5 mg/h via INTRAVENOUS
  Filled 2024-04-18 (×2): qty 125

## 2024-04-18 MED ORDER — ONDANSETRON HCL 4 MG/2ML IJ SOLN
INTRAMUSCULAR | Status: AC
  Filled 2024-04-18: qty 2

## 2024-04-18 MED ORDER — SODIUM CHLORIDE 0.9 % IV BOLUS
500.0000 mL | Freq: Once | INTRAVENOUS | Status: AC
  Administered 2024-04-18: 500 mL via INTRAVENOUS

## 2024-04-18 MED ORDER — SODIUM CHLORIDE 0.9 % IV SOLN
2.0000 g | Freq: Once | INTRAVENOUS | Status: AC
  Administered 2024-04-18: 2 g via INTRAVENOUS
  Filled 2024-04-18: qty 20

## 2024-04-18 MED ORDER — MORPHINE SULFATE (PF) 4 MG/ML IV SOLN
4.0000 mg | Freq: Once | INTRAVENOUS | Status: AC
  Administered 2024-04-18: 4 mg via INTRAVENOUS
  Filled 2024-04-18: qty 1

## 2024-04-18 MED ORDER — SODIUM CHLORIDE 0.9 % IV SOLN: INTRAVENOUS | Status: AC | PRN

## 2024-04-18 MED ORDER — ONDANSETRON HCL 4 MG/2ML IJ SOLN
4.0000 mg | Freq: Once | INTRAMUSCULAR | Status: AC
  Administered 2024-04-18: 4 mg via INTRAVENOUS

## 2024-04-18 MED ORDER — SODIUM CHLORIDE 0.9 % IV SOLN
500.0000 mg | Freq: Once | INTRAVENOUS | Status: AC
  Administered 2024-04-18: 500 mg via INTRAVENOUS
  Filled 2024-04-18: qty 5

## 2024-04-18 MED ORDER — CHLORHEXIDINE GLUCONATE CLOTH 2 % EX PADS
6.0000 | MEDICATED_PAD | Freq: Every day | CUTANEOUS | Status: DC
  Filled 2024-04-18: qty 6

## 2024-04-18 NOTE — ED Provider Notes (Signed)
 [place] [facility] AT [facility] Provider Note   CSN: [id number] Arrival date & time: 04/18/24  1622     Patient presents with: Leg Swelling   Hannah Khan is a 68 y.o. female.  She has significant medical history for A-fib, aortic stenosis, diabetes, congestive heart failure, liver disease.  She is here with a complaint of left leg redness and pain that is been going on since this morning.  She felt it might be cellulitis.  She has not had a fever but has felt nauseous.  She went to [facility] urgent care and they sent her here to get an ultrasound.  She also endorses pain in her neck back and shoulders after lifting her husband last week.  She said she has been unable to sleep due to her pain.  {Add pertinent medical, surgical, social history, OB history to YEP:67052} The history is provided by the patient.  Leg Pain Location:  Leg Time since incident:  1 day Injury: no   Leg location:  L leg Pain details:    Quality:  Aching   Severity:  Moderate   Onset quality:  Gradual   Timing:  Constant Associated symptoms: back pain and neck pain   Associated symptoms: no fever        Prior to Admission medications   Medication Sig Start Date End Date Taking? Authorizing Provider  albuterol  (VENTOLIN  HFA) 108 (90 Base) MCG/ACT inhaler Inhale 1-2 puffs into the lungs every 6 (six) hours as needed for wheezing or shortness of breath.    [provider]  benzonatate  (TESSALON ) 100 MG capsule Take 100 mg by mouth 3 (three) times daily as needed for cough. 01/30/24   [provider]  Continuous Glucose Sensor (FREESTYLE LIBRE 3 PLUS SENSOR) MISC Apply 1 each topically as directed. Place on arm as directed and change every 15 days. 01/12/24   [provider]  digoxin  (LANOXIN ) 0.125 MG tablet TAKE 1 TABLET BY MOUTH EVERY DAY 04/17/24   Sabharwal, Aditya, DO  lactulose  (CHRONULAC ) 10 GM/15ML solution Take 20 g by mouth 3 (three) times daily.  03/17/21   [provider]  Lancets (ONETOUCH DELICA PLUS [place]) MISC Apply topically 3 (three) times daily. 04/10/20   [provider]  metolazone  (ZAROXOLYN ) 2.5 MG tablet TAKE 1 TABLET (2.5 MG TOTAL) BY MOUTH AS NEEDED (AS DIRECTED BY [facility] CLINIC). 10/19/23   Sabharwal, Aditya, DO  metoprolol  tartrate (LOPRESSOR ) 25 MG tablet Take 1 tablet (25 mg total) by mouth 2 (two) times daily. 07/11/23   Elgergawy, Brayton GORMAN, MD  midodrine  (PROAMATINE ) 10 MG tablet Take 1 tablet (10 mg total) by mouth 3 (three) times daily. 04/18/23   Walker, Caitlin S, NP  Multiple Vitamins-Minerals (PRESERVISION AREDS 2) CAPS Take 1 capsule by mouth daily.    [provider]  [facility] VERIO test strip 1 each 3 (three) times daily. 04/13/20   [provider]  pantoprazole  (PROTONIX ) 40 MG tablet Take 40 mg by mouth 2 (two) times daily. 11/17/23   [provider]  potassium chloride  (MICRO-K ) 10 MEQ CR capsule Take 10 capsules (100 mEq total) by mouth 2 (two) times daily. Take an extra 4 tablets when taking Metolazone . 06/15/23   Colletta Manuelita Garre, PA-C  Propylene Glycol (SYSTANE BALANCE) 0.6 % SOLN Place 1 drop into both eyes 2 (two) times daily.    [provider]  spironolactone  (ALDACTONE ) 25 MG tablet TAKE 3 TABLETS BY MOUTH DAILY 03/05/24   Sabharwal, [place],  DO  spironolactone  (ALDACTONE ) 50 MG tablet Take 50 mg by mouth daily. Take with 25 mg tablet for a total dose of 75 mg daily. 01/18/24   [provider]  torsemide  (DEMADEX ) 20 MG tablet Take 5 tablets (100 mg total) by mouth every morning for 5 days, THEN 2 tablets (40 mg total) every evening for 5 days, THEN 5 tablets (100 mg total) daily. Patient taking differently: Take 4 tablets (80 mg total) by mouth in the morning and 2 tablets (40 mg total) in the evening 08/02/23 08/11/24  Sabharwal, Aditya, DO  TRESIBA  FLEXTOUCH 100 UNIT/ML FlexTouch Pen Inject 24 Units into the skin daily. (TITRATE WHEN DIRECTED  UP TO 50 UNITS) 01/22/24   [provider]  XIFAXAN  550 MG TABS tablet Take 550 mg by mouth 2 (two) times daily. 03/13/21   [provider]    Allergies: Celebrex [celecoxib], Shellfish allergy, Barium-containing compounds, Prednisone, Statins, Fentanyl , Ibuprofen , Sglt2 inhibitors, Tramadol , Tylenol  [acetaminophen ], and Zetia [ezetimibe]    Review of Systems  Constitutional:  Negative for fever.  Respiratory:  Positive for shortness of breath.   Cardiovascular:  Negative for chest pain.  Gastrointestinal:  Positive for nausea. Negative for abdominal pain.  Musculoskeletal:  Positive for back pain and neck pain.  Skin:  Positive for rash.    Updated Vital Signs BP (!) 115/38 (BP Location: Right Arm)   Pulse 98   Temp 98.9 F (37.2 C)   Resp (!) 25   Ht 5' 7 (1.702 m)   Wt 128.4 kg   SpO2 100%   BMI 44.32 kg/m   Physical Exam Vitals and nursing note reviewed.  Constitutional:      General: She is not in acute distress.    Appearance: Normal appearance. She is well-developed.  HENT:     Head: Normocephalic and atraumatic.  Eyes:     Conjunctiva/sclera: Conjunctivae normal.  Cardiovascular:     Rate and Rhythm: Tachycardia present. Rhythm irregular.     Heart sounds: Murmur heard.  Pulmonary:     Effort: Pulmonary effort is normal. Tachypnea present. No respiratory distress.     Breath sounds: Normal breath sounds. No stridor. No wheezing.  Abdominal:     Palpations: Abdomen is soft.     Tenderness: There is no abdominal tenderness. There is no guarding or rebound.  Musculoskeletal:        General: Tenderness present.     Cervical back: Neck supple.     Right lower leg: Edema present.     Left lower leg: Edema present.     Comments: She has erythema and warmth of her left upper inner thigh.  Does not appear to go onto the perineum.  No cords appreciated.  She has chronic stasis changes of her lower extremities.  No significant open wounds.  Skin:     General: Skin is warm and dry.  Neurological:     General: No focal deficit present.     Mental Status: She is alert.     GCS: GCS eye subscore is 4. GCS verbal subscore is 5. GCS motor subscore is 6.     Motor: No weakness.     (all labs ordered are listed, but only abnormal results are displayed) Labs Reviewed  CULTURE, BLOOD (ROUTINE X 2)  CULTURE, BLOOD (ROUTINE X 2)  CBC WITH DIFFERENTIAL/PLATELET  COMPREHENSIVE METABOLIC PANEL WITH GFR  LACTIC ACID, PLASMA  LACTIC ACID, PLASMA  PROTIME-INR    EKG: None  Radiology: No results found.  {  Document cardiac monitor, telemetry assessment procedure when appropriate:32947} Procedures   Medications Ordered in the ED  morphine  (PF) 4 MG/ML injection 4 mg (has no administration in time range)      {Click here for ABCD2, HEART and other calculators REFRESH Note before signing:1}                              Medical Decision Making Amount and/or Complexity of Data Reviewed Labs: ordered.  Risk Prescription drug management.   This patient complains of ***; this involves an extensive number of treatment Options and is a complaint that carries with it a high risk of complications and morbidity. The differential includes ***  I ordered, reviewed and interpreted labs, which included *** I ordered medication *** and reviewed PMP when indicated. I ordered imaging studies which included *** and I independently    visualized and interpreted imaging which showed *** Additional history obtained from *** Previous records obtained and reviewed *** I consulted *** and discussed lab and imaging findings and discussed disposition.  Cardiac monitoring reviewed, *** Social determinants considered, *** Critical Interventions: ***  After the interventions stated above, I reevaluated the patient and found *** Admission and further testing considered, ***   {Document critical care time when appropriate  Document review of labs and  clinical decision tools ie CHADS2VASC2, etc  Document your independent review of radiology images and any outside records  Document your discussion with family members, caretakers and with consultants  Document social determinants of health affecting pt's care  Document your decision making why or why not admission, treatments were needed:32947:::1}   Final diagnoses:  None    ED Discharge Orders     None

## 2024-04-18 NOTE — ED Triage Notes (Signed)
 Pt POV sent by PCP- pt c/o LLE swelling since this AM. Denies known injury. Pt reports LLE is red, hot and burning.   Also c/o neck and back spasms, seen by PCP for same today, prescribed muscle relaxer.

## 2024-04-18 NOTE — ED Notes (Addendum)
 There was a nursing miscommunication during the IV sticks, the other RN had not drawn labs when I was told she did. I re-pulled them and submitted them to the lab.

## 2024-04-18 NOTE — ED Notes (Signed)
 Pt gave staff significant issues obtaining an IV. Multiple staff had to assist to obtain it.

## 2024-04-18 NOTE — ED Notes (Signed)
 Unable to obtain IV access with multiple sticks. Informed EDP about need for USIV.

## 2024-04-18 NOTE — ED Notes (Signed)
 Pt placed into gown. Notable pain to L knee with redness to anterior knee cap, no deformity but positive swelling noted as well. Consistent with cellultis irritation.

## 2024-04-19 DIAGNOSIS — D62 Acute posthemorrhagic anemia: Secondary | ICD-10-CM | POA: Diagnosis present

## 2024-04-19 DIAGNOSIS — E872 Acidosis, unspecified: Secondary | ICD-10-CM | POA: Diagnosis present

## 2024-04-19 DIAGNOSIS — Z794 Long term (current) use of insulin: Secondary | ICD-10-CM | POA: Diagnosis not present

## 2024-04-19 DIAGNOSIS — D696 Thrombocytopenia, unspecified: Secondary | ICD-10-CM | POA: Diagnosis present

## 2024-04-19 DIAGNOSIS — L03116 Cellulitis of left lower limb: Secondary | ICD-10-CM | POA: Diagnosis present

## 2024-04-19 DIAGNOSIS — A411 Sepsis due to other specified staphylococcus: Secondary | ICD-10-CM | POA: Diagnosis present

## 2024-04-19 DIAGNOSIS — I4891 Unspecified atrial fibrillation: Secondary | ICD-10-CM | POA: Diagnosis not present

## 2024-04-19 DIAGNOSIS — J90 Pleural effusion, not elsewhere classified: Secondary | ICD-10-CM | POA: Diagnosis not present

## 2024-04-19 DIAGNOSIS — D649 Anemia, unspecified: Secondary | ICD-10-CM | POA: Diagnosis not present

## 2024-04-19 DIAGNOSIS — Z1152 Encounter for screening for COVID-19: Secondary | ICD-10-CM | POA: Diagnosis not present

## 2024-04-19 DIAGNOSIS — K7581 Nonalcoholic steatohepatitis (NASH): Secondary | ICD-10-CM | POA: Diagnosis present

## 2024-04-19 DIAGNOSIS — L539 Erythematous condition, unspecified: Secondary | ICD-10-CM | POA: Diagnosis not present

## 2024-04-19 DIAGNOSIS — I350 Nonrheumatic aortic (valve) stenosis: Secondary | ICD-10-CM | POA: Diagnosis present

## 2024-04-19 DIAGNOSIS — Z8719 Personal history of other diseases of the digestive system: Secondary | ICD-10-CM | POA: Diagnosis not present

## 2024-04-19 DIAGNOSIS — N189 Chronic kidney disease, unspecified: Secondary | ICD-10-CM | POA: Diagnosis not present

## 2024-04-19 DIAGNOSIS — D509 Iron deficiency anemia, unspecified: Secondary | ICD-10-CM | POA: Diagnosis not present

## 2024-04-19 DIAGNOSIS — R011 Cardiac murmur, unspecified: Secondary | ICD-10-CM | POA: Diagnosis not present

## 2024-04-19 DIAGNOSIS — R0989 Other specified symptoms and signs involving the circulatory and respiratory systems: Secondary | ICD-10-CM | POA: Diagnosis not present

## 2024-04-19 DIAGNOSIS — J189 Pneumonia, unspecified organism: Secondary | ICD-10-CM | POA: Diagnosis not present

## 2024-04-19 DIAGNOSIS — K7469 Other cirrhosis of liver: Secondary | ICD-10-CM | POA: Diagnosis not present

## 2024-04-19 DIAGNOSIS — B957 Other staphylococcus as the cause of diseases classified elsewhere: Secondary | ICD-10-CM | POA: Diagnosis not present

## 2024-04-19 DIAGNOSIS — J181 Lobar pneumonia, unspecified organism: Secondary | ICD-10-CM | POA: Diagnosis not present

## 2024-04-19 DIAGNOSIS — L988 Other specified disorders of the skin and subcutaneous tissue: Secondary | ICD-10-CM | POA: Diagnosis not present

## 2024-04-19 DIAGNOSIS — N1832 Chronic kidney disease, stage 3b: Secondary | ICD-10-CM | POA: Diagnosis present

## 2024-04-19 DIAGNOSIS — E1165 Type 2 diabetes mellitus with hyperglycemia: Secondary | ICD-10-CM | POA: Diagnosis present

## 2024-04-19 DIAGNOSIS — K766 Portal hypertension: Secondary | ICD-10-CM | POA: Diagnosis present

## 2024-04-19 DIAGNOSIS — I4821 Permanent atrial fibrillation: Secondary | ICD-10-CM | POA: Diagnosis present

## 2024-04-19 DIAGNOSIS — E871 Hypo-osmolality and hyponatremia: Secondary | ICD-10-CM | POA: Diagnosis present

## 2024-04-19 DIAGNOSIS — K661 Hemoperitoneum: Secondary | ICD-10-CM | POA: Diagnosis not present

## 2024-04-19 DIAGNOSIS — R6520 Severe sepsis without septic shock: Secondary | ICD-10-CM | POA: Diagnosis present

## 2024-04-19 DIAGNOSIS — E1122 Type 2 diabetes mellitus with diabetic chronic kidney disease: Secondary | ICD-10-CM | POA: Diagnosis present

## 2024-04-19 DIAGNOSIS — A419 Sepsis, unspecified organism: Secondary | ICD-10-CM | POA: Diagnosis not present

## 2024-04-19 DIAGNOSIS — I5032 Chronic diastolic (congestive) heart failure: Secondary | ICD-10-CM | POA: Diagnosis present

## 2024-04-19 DIAGNOSIS — D61818 Other pancytopenia: Secondary | ICD-10-CM | POA: Diagnosis present

## 2024-04-19 DIAGNOSIS — R7881 Bacteremia: Secondary | ICD-10-CM | POA: Diagnosis not present

## 2024-04-19 DIAGNOSIS — Z6841 Body Mass Index (BMI) 40.0 and over, adult: Secondary | ICD-10-CM | POA: Diagnosis not present

## 2024-04-19 DIAGNOSIS — I130 Hypertensive heart and chronic kidney disease with heart failure and stage 1 through stage 4 chronic kidney disease, or unspecified chronic kidney disease: Secondary | ICD-10-CM | POA: Diagnosis present

## 2024-04-19 DIAGNOSIS — I8510 Secondary esophageal varices without bleeding: Secondary | ICD-10-CM | POA: Diagnosis present

## 2024-04-19 DIAGNOSIS — R918 Other nonspecific abnormal finding of lung field: Secondary | ICD-10-CM | POA: Diagnosis not present

## 2024-04-19 DIAGNOSIS — K7460 Unspecified cirrhosis of liver: Secondary | ICD-10-CM | POA: Diagnosis present

## 2024-04-19 DIAGNOSIS — N179 Acute kidney failure, unspecified: Secondary | ICD-10-CM | POA: Diagnosis present

## 2024-04-19 LAB — URINALYSIS, ROUTINE W REFLEX MICROSCOPIC
Bilirubin Urine: NEGATIVE
Glucose, UA: NEGATIVE mg/dL
Hgb urine dipstick: NEGATIVE
Ketones, ur: NEGATIVE mg/dL
Leukocytes,Ua: NEGATIVE
Nitrite: NEGATIVE
Protein, ur: NEGATIVE mg/dL
Specific Gravity, Urine: 1.013 (ref 1.005–1.030)
pH: 5 (ref 5.0–8.0)

## 2024-04-19 LAB — PREPARE RBC (CROSSMATCH)

## 2024-04-19 LAB — GLUCOSE, CAPILLARY
Glucose-Capillary: 212 mg/dL — ABNORMAL HIGH (ref 70–99)
Glucose-Capillary: 261 mg/dL — ABNORMAL HIGH (ref 70–99)
Glucose-Capillary: 265 mg/dL — ABNORMAL HIGH (ref 70–99)
Glucose-Capillary: 241 mg/dL — ABNORMAL HIGH (ref 70–99)

## 2024-04-19 LAB — HEPATIC FUNCTION PANEL
ALT: 19 U/L (ref 0–44)
AST: 27 U/L (ref 15–41)
Albumin: 2.9 g/dL — ABNORMAL LOW (ref 3.5–5.0)
Alkaline Phosphatase: 69 U/L (ref 38–126)
Bilirubin, Direct: 0.7 mg/dL — ABNORMAL HIGH (ref 0.0–0.2)
Indirect Bilirubin: 2.7 mg/dL — ABNORMAL HIGH (ref 0.3–0.9)
Total Bilirubin: 3.4 mg/dL — ABNORMAL HIGH (ref 0.0–1.2)
Total Protein: 6.4 g/dL — ABNORMAL LOW (ref 6.5–8.1)

## 2024-04-19 LAB — BASIC METABOLIC PANEL WITH GFR
Anion gap: 6 (ref 5–15)
BUN: 46 mg/dL — ABNORMAL HIGH (ref 8–23)
CO2: 23 mmol/L (ref 22–32)
Calcium: 8.6 mg/dL — ABNORMAL LOW (ref 8.9–10.3)
Chloride: 102 mmol/L (ref 98–111)
Creatinine, Ser: 2.14 mg/dL — ABNORMAL HIGH (ref 0.44–1.00)
GFR, Estimated: 25 mL/min — ABNORMAL LOW (ref >60)
Glucose, Bld: 250 mg/dL — ABNORMAL HIGH (ref 70–99)
Potassium: 4.4 mmol/L (ref 3.5–5.1)
Sodium: 131 mmol/L — ABNORMAL LOW (ref 135–145)

## 2024-04-19 LAB — TECHNOLOGIST SMEAR REVIEW: Plt Morphology: DECREASED

## 2024-04-19 LAB — DIFFERENTIAL
Basophils Absolute: 0 K/uL (ref 0.0–0.1)
Basophils Relative: 0 %
Eosinophils Absolute: 0 K/uL (ref 0.0–0.5)
Eosinophils Relative: 0 %
Lymphocytes Relative: 3 %
Lymphs Abs: 0.2 K/uL — ABNORMAL LOW (ref 0.7–4.0)
Monocytes Absolute: 0.1 K/uL (ref 0.1–1.0)
Monocytes Relative: 2 %
Neutro Abs: 6.7 K/uL (ref 1.7–7.7)
Neutrophils Relative %: 95 %

## 2024-04-19 LAB — BLOOD CULTURE ID PANEL (REFLEXED) - BCID2

## 2024-04-19 LAB — RESPIRATORY PANEL BY PCR

## 2024-04-19 LAB — FOLATE: Folate: 12 ng/mL (ref >5.9)

## 2024-04-19 LAB — CBC
HCT: 19.9 % — ABNORMAL LOW (ref 36.0–46.0)
HCT: 24.6 % — ABNORMAL LOW (ref 36.0–46.0)
Hemoglobin: 6 g/dL — CL (ref 12.0–15.0)
Hemoglobin: 7.8 g/dL — ABNORMAL LOW (ref 12.0–15.0)
MCH: 27.8 pg (ref 26.0–34.0)
MCH: 28.3 pg (ref 26.0–34.0)
MCHC: 30.2 g/dL (ref 30.0–36.0)
MCHC: 31.7 g/dL (ref 30.0–36.0)
MCV: 92.1 fL (ref 80.0–100.0)
MCV: 89.1 fL (ref 80.0–100.0)
Platelets: 30 K/uL — ABNORMAL LOW (ref 150–400)
Platelets: 36 K/uL — ABNORMAL LOW (ref 150–400)
RBC: 2.16 MIL/uL — ABNORMAL LOW (ref 3.87–5.11)
RBC: 2.76 MIL/uL — ABNORMAL LOW (ref 3.87–5.11)
RDW: 18.8 % — ABNORMAL HIGH (ref 11.5–15.5)
RDW: 17.7 % — ABNORMAL HIGH (ref 11.5–15.5)
WBC: 7.1 K/uL (ref 4.0–10.5)
WBC: 6 K/uL (ref 4.0–10.5)
nRBC: 0.3 % — ABNORMAL HIGH (ref 0.0–0.2)
nRBC: 0 % (ref 0.0–0.2)

## 2024-04-19 LAB — RETICULOCYTES
Immature Retic Fract: 26.4 % — ABNORMAL HIGH (ref 2.3–15.9)
RBC.: 2.19 MIL/uL — ABNORMAL LOW (ref 3.87–5.11)
Retic Count, Absolute: 110.4 K/uL (ref 19.0–186.0)
Retic Ct Pct: 5 % — ABNORMAL HIGH (ref 0.4–3.1)

## 2024-04-19 LAB — RESP PANEL BY RT-PCR (RSV, FLU A&B, COVID)  RVPGX2
Influenza A by PCR: NEGATIVE
Influenza B by PCR: NEGATIVE
Resp Syncytial Virus by PCR: NEGATIVE
SARS Coronavirus 2 by RT PCR: NEGATIVE

## 2024-04-19 LAB — PROTIME-INR
INR: 1.4 — ABNORMAL HIGH (ref 0.8–1.2)
Prothrombin Time: 17.7 s — ABNORMAL HIGH (ref 11.4–15.2)

## 2024-04-19 LAB — VITAMIN B12: Vitamin B-12: 445 pg/mL (ref 180–914)

## 2024-04-19 LAB — HEMOGLOBIN AND HEMATOCRIT, BLOOD
HCT: 25.2 % — ABNORMAL LOW (ref 36.0–46.0)
Hemoglobin: 7.8 g/dL — ABNORMAL LOW (ref 12.0–15.0)

## 2024-04-19 LAB — PHOSPHORUS: Phosphorus: 3.5 mg/dL (ref 2.5–4.6)

## 2024-04-19 LAB — IRON AND TIBC
Iron: 67 ug/dL (ref 28–170)
Saturation Ratios: 20 % (ref 10.4–31.8)
TIBC: 340 ug/dL (ref 250–450)
UIBC: 273 ug/dL

## 2024-04-19 LAB — TRANSFERRIN: Transferrin: 243 mg/dL (ref 192–382)

## 2024-04-19 LAB — LACTATE DEHYDROGENASE: LDH: 155 U/L (ref 98–192)

## 2024-04-19 LAB — MAGNESIUM: Magnesium: 2 mg/dL (ref 1.7–2.4)

## 2024-04-19 LAB — FERRITIN: Ferritin: 31 ng/mL (ref 11–307)

## 2024-04-19 LAB — MRSA NEXT GEN BY PCR, NASAL: MRSA by PCR Next Gen: NOT DETECTED

## 2024-04-19 MED ORDER — DIPHENHYDRAMINE HCL 50 MG/ML IJ SOLN
25.0000 mg | Freq: Four times a day (QID) | INTRAMUSCULAR | Status: DC | PRN
  Administered 2024-04-19: 25 mg via INTRAVENOUS
  Filled 2024-04-19: qty 1

## 2024-04-19 MED ORDER — LIDOCAINE 5 % EX PTCH
2.0000 | MEDICATED_PATCH | Freq: Every day | CUTANEOUS | Status: DC | PRN
  Administered 2024-04-19 – 2024-04-24 (×2): 2 via TRANSDERMAL
  Filled 2024-04-19 (×2): qty 2

## 2024-04-19 MED ORDER — OXYCODONE HCL 5 MG PO TABS
5.0000 mg | ORAL_TABLET | Freq: Four times a day (QID) | ORAL | Status: DC | PRN
  Administered 2024-04-19: 5 mg via ORAL
  Filled 2024-04-19: qty 1

## 2024-04-19 MED ORDER — SODIUM CHLORIDE 0.9% FLUSH
3.0000 mL | Freq: Two times a day (BID) | INTRAVENOUS | Status: DC
  Administered 2024-04-19 – 2024-04-24 (×11): 3 mL via INTRAVENOUS

## 2024-04-19 MED ORDER — INSULIN ASPART 100 UNIT/ML IJ SOLN
0.0000 [IU] | Freq: Three times a day (TID) | INTRAMUSCULAR | Status: DC
  Administered 2024-04-19: 3 [IU] via SUBCUTANEOUS
  Administered 2024-04-19 (×2): 5 [IU] via SUBCUTANEOUS
  Administered 2024-04-20: 3 [IU] via SUBCUTANEOUS
  Administered 2024-04-20: 2 [IU] via SUBCUTANEOUS

## 2024-04-19 MED ORDER — LACTULOSE 10 GM/15ML PO SOLN
20.0000 g | Freq: Three times a day (TID) | ORAL | Status: DC
  Administered 2024-04-19 – 2024-04-24 (×9): 20 g via ORAL
  Filled 2024-04-19 (×12): qty 30

## 2024-04-19 MED ORDER — METHOCARBAMOL 500 MG PO TABS
500.0000 mg | ORAL_TABLET | Freq: Three times a day (TID) | ORAL | Status: DC | PRN
  Administered 2024-04-19 – 2024-04-23 (×4): 500 mg via ORAL
  Filled 2024-04-19 (×4): qty 1

## 2024-04-19 MED ORDER — SODIUM CHLORIDE 0.9% IV SOLUTION: Freq: Once | INTRAVENOUS | Status: DC

## 2024-04-19 MED ORDER — PANTOPRAZOLE SODIUM 40 MG PO TBEC
40.0000 mg | DELAYED_RELEASE_TABLET | Freq: Two times a day (BID) | ORAL | Status: DC
Start: 2024-04-19 — End: 2024-04-24
  Administered 2024-04-19 – 2024-04-24 (×11): 40 mg via ORAL
  Filled 2024-04-19 (×11): qty 1

## 2024-04-19 MED ORDER — VANCOMYCIN HCL 2000 MG/400ML IV SOLN
2000.0000 mg | Freq: Once | INTRAVENOUS | Status: AC
  Administered 2024-04-19: 2000 mg via INTRAVENOUS
  Filled 2024-04-19: qty 400

## 2024-04-19 MED ORDER — VANCOMYCIN HCL 1500 MG/300ML IV SOLN: 1500.0000 mg | INTRAVENOUS | Status: DC

## 2024-04-19 MED ORDER — MORPHINE SULFATE (PF) 4 MG/ML IV SOLN
4.0000 mg | Freq: Once | INTRAVENOUS | Status: AC
  Administered 2024-04-19: 4 mg via INTRAVENOUS
  Filled 2024-04-19: qty 1

## 2024-04-19 MED ORDER — ONDANSETRON HCL 4 MG/2ML IJ SOLN: 4.0000 mg | Freq: Four times a day (QID) | INTRAMUSCULAR | Status: DC | PRN

## 2024-04-19 MED ORDER — INSULIN GLARGINE-YFGN 100 UNIT/ML ~~LOC~~ SOLN
20.0000 [IU] | Freq: Every day | SUBCUTANEOUS | Status: DC
  Filled 2024-04-19: qty 0.2

## 2024-04-19 MED ORDER — ORAL CARE MOUTH RINSE: 15.0000 mL | OROMUCOSAL | Status: DC | PRN

## 2024-04-19 MED ORDER — INSULIN GLARGINE-YFGN 100 UNIT/ML ~~LOC~~ SOLN
24.0000 [IU] | Freq: Every day | SUBCUTANEOUS | Status: DC
  Administered 2024-04-19 – 2024-04-24 (×6): 24 [IU] via SUBCUTANEOUS
  Filled 2024-04-19 (×6): qty 0.24

## 2024-04-19 MED ORDER — MIDODRINE HCL 5 MG PO TABS
10.0000 mg | ORAL_TABLET | Freq: Three times a day (TID) | ORAL | Status: DC
  Administered 2024-04-19 – 2024-04-24 (×17): 10 mg via ORAL
  Filled 2024-04-19 (×17): qty 2

## 2024-04-19 MED ORDER — CHLORHEXIDINE GLUCONATE CLOTH 2 % EX PADS
6.0000 | MEDICATED_PAD | Freq: Every day | CUTANEOUS | Status: DC
  Administered 2024-04-19 – 2024-04-23 (×3): 6 via TOPICAL

## 2024-04-19 MED ORDER — DIGOXIN 125 MCG PO TABS
125.0000 ug | ORAL_TABLET | Freq: Every day | ORAL | Status: DC
  Administered 2024-04-19 – 2024-04-24 (×6): 125 ug via ORAL
  Filled 2024-04-19 (×6): qty 1

## 2024-04-19 MED ORDER — SODIUM CHLORIDE 0.9 % IV SOLN: 2.0000 g | INTRAVENOUS | Status: DC

## 2024-04-19 MED ORDER — OXYCODONE HCL 5 MG PO TABS
2.5000 mg | ORAL_TABLET | Freq: Four times a day (QID) | ORAL | Status: DC | PRN
  Administered 2024-04-19: 2.5 mg via ORAL
  Filled 2024-04-19: qty 1

## 2024-04-19 MED ORDER — ALBUTEROL SULFATE (2.5 MG/3ML) 0.083% IN NEBU: 2.5000 mg | INHALATION_SOLUTION | RESPIRATORY_TRACT | Status: DC | PRN

## 2024-04-19 MED ORDER — AZITHROMYCIN 250 MG PO TABS
500.0000 mg | ORAL_TABLET | Freq: Every day | ORAL | Status: DC
  Administered 2024-04-19: 500 mg via ORAL
  Filled 2024-04-19: qty 2

## 2024-04-19 MED ORDER — MELATONIN 3 MG PO TABS
6.0000 mg | ORAL_TABLET | Freq: Every evening | ORAL | Status: DC | PRN
  Filled 2024-04-19: qty 2

## 2024-04-19 MED ORDER — HYDROXYZINE HCL 25 MG PO TABS
25.0000 mg | ORAL_TABLET | Freq: Three times a day (TID) | ORAL | Status: DC | PRN
  Administered 2024-04-19: 25 mg via ORAL
  Filled 2024-04-19 (×2): qty 1

## 2024-04-19 MED ORDER — SODIUM CHLORIDE 0.9% IV SOLUTION: Freq: Once | INTRAVENOUS | Status: AC

## 2024-04-19 MED ORDER — METOPROLOL TARTRATE 25 MG PO TABS
25.0000 mg | ORAL_TABLET | Freq: Two times a day (BID) | ORAL | Status: DC
  Administered 2024-04-19 – 2024-04-20 (×3): 25 mg via ORAL
  Filled 2024-04-19 (×3): qty 1

## 2024-04-19 MED ORDER — RIFAXIMIN 550 MG PO TABS
550.0000 mg | ORAL_TABLET | Freq: Two times a day (BID) | ORAL | Status: DC
Start: 2024-04-19 — End: 2024-04-24
  Administered 2024-04-19 – 2024-04-24 (×11): 550 mg via ORAL
  Filled 2024-04-19 (×11): qty 1

## 2024-04-19 MED ORDER — SODIUM CHLORIDE 0.9 % IV SOLN
2.0000 g | Freq: Two times a day (BID) | INTRAVENOUS | Status: DC
  Administered 2024-04-19 – 2024-04-21 (×5): 2 g via INTRAVENOUS
  Filled 2024-04-19 (×5): qty 12.5

## 2024-04-19 MED ORDER — PIPERACILLIN-TAZOBACTAM 3.375 G IVPB
3.3750 g | Freq: Three times a day (TID) | INTRAVENOUS | Status: DC
  Administered 2024-04-19: 3.375 g via INTRAVENOUS
  Filled 2024-04-19: qty 50

## 2024-04-19 MED ORDER — FAMOTIDINE IN NACL 20-0.9 MG/50ML-% IV SOLN
20.0000 mg | Freq: Two times a day (BID) | INTRAVENOUS | Status: DC
  Administered 2024-04-19 – 2024-04-24 (×10): 20 mg via INTRAVENOUS
  Filled 2024-04-19 (×10): qty 50

## 2024-04-19 NOTE — ED Notes (Signed)
 [facility] at bedside

## 2024-04-19 NOTE — Progress Notes (Signed)
 Pharmacy Antibiotic Note  Hannah Khan is a 68 y.o. female admitted on 04/18/2024 with sepsis/cellulitis of leg.  Pharmacy has been consulted for Vancomycin  dosing.  Plan: Vancomycin  2g IV x 1 followed by Vancomycin  1500 mg IV Q 48 hrs. Goal AUC 400-550. Expected AUC: 485.5  SCr used: 2.21 Ceftriaxone /Azithromycin  per MD Follow renal function   Height: 5' 7 (170.2 cm) Weight: 128.4 kg (283 lb) IBW/kg (Calculated) : 61.6  Temp (24hrs), Avg:100.6 F (38.1 C), Min:98.9 F (37.2 C), Max:102.2 F (39 C)  Recent Labs  Lab 04/18/24 1908 04/18/24 2138  WBC 4.2  --   CREATININE 2.21*  --   LATICACIDVEN 2.6* 2.0*    Estimated Creatinine Clearance: 34 mL/min (A) (by C-G formula based on SCr of 2.21 mg/dL (H)).    Allergies  Allergen Reactions   Celebrex [Celecoxib] Other (See Comments)    speech slurred with celebrex   Shellfish Allergy Swelling, Rash and Other (See Comments)    TINGLING AND SWELLING OF LIPS. LARGE WHELPS, QUARTER-SIZE   Barium-Containing Compounds Other (See Comments)    TACHYCARDIA   Prednisone Other (See Comments)    TACHYCARDIA   Statins Other (See Comments)    AGGRAVATED FIBROMYALGIA   Fentanyl  Other (See Comments)    Hallucinations    Ibuprofen  Other (See Comments)    UNSPECIFIED SPECIFIC REACTION >> DUE TO PLATELETS   Sglt2 Inhibitors Other (See Comments)    Ozempic , Severe gastroparesis    Tramadol  Other (See Comments)    Hallucination    Tylenol  [Acetaminophen ] Other (See Comments)    UNSPECIFIED SPECIFIC REACTION >> DUE TO PLATELETS   Zetia [Ezetimibe] Other (See Comments)    Headache    Antimicrobials this admission: 7/31 Ceftriaxone  >>   7/31 Azithromycin  >>   8/1 Vancomycin  >>  Dose adjustments this admission:    Microbiology results: 7/31 BCx:   8/1 MRSA PCR:    Thank you for allowing pharmacy to be a part of this patient's care.  Kemp Arvin Fletcher, PharmD 04/19/2024 2:29 AM

## 2024-04-19 NOTE — Progress Notes (Signed)
  Progress Note   Patient: Hannah Khan FMW:[id number] DOB: 12-23-1955 DOA: 04/18/2024     0 DOS: the patient was seen and examined on 04/19/2024    Assessment and Plan: Sepsis due to Cellulitis LLE & PNA  - Azithromycin  500 mg PO daily  - IV cefepime  2 g q12  - Albuterol  q4 hr PRN  - Oxy PRN  - Midodrine  10 mg PO tid   Afib with RVR  - Digoxin  125 mcg PO daily - Lopressor  25 mg PO bid   - IV cardizem  drip   Acute on chronic anemia  - 2 unit PRBC ordered 04/19/2024 due to Hgb 6.0 - GI has been consulted  - Protonix  40 mg PO bid   CKD3b - Monitor   NASH cirrhosis  - Lactulose  20 g PO tid  - Rifaximin  550 mg PO bid   DM2 - Novolog  SS tid  - Semglee  24 units sq daily   Morbid obesity  - BMI 46.79 kg/m2  - Complicating care   Subjective: Pt seen and examined at the bedside. Hgb 6.0 this morning and 5.9 yesterday (04/18/2024). 2 unit PRBC ordered this morning. GI consult appreciated. Antibx changed to IV zosyn  today as ceftriaxone  does not appear to have worked on the cellulitis (erythema was noted to be over the line today). IV vancomycin  stopped as the pt's MRSA PCR is negative 04/19/2024.  UPDATE: Pt started itching with zosyn . Zosyn  discontinued and IV cefepime  ordered. Atarax  ordered for itching.  Physical Exam: Vitals:   04/19/24 0605 04/19/24 0800 04/19/24 0945 04/19/24 1013  BP: (!) 140/45  132/78 (!) 150/78  Pulse: (!) 111  (!) 103 (!) 103  Resp: 10  (!) 22 (!) 21  Temp: 98.6 F (37 C) 98.3 F (36.8 C) 97.9 F (36.6 C)   TempSrc: Oral Oral Oral   SpO2: 100%     Weight:      Height:       Physical Exam HENT:     Head: Normocephalic.  Cardiovascular:     Rate and Rhythm: Tachycardia present.  Pulmonary:     Effort: Pulmonary effort is normal.  Abdominal:     Palpations: Abdomen is soft.  Musculoskeletal:        General: Normal range of motion.  Skin:    General: Skin is warm.     Comments: Erythema on the LLE is spreading over the line   Neurological:     Mental Status: She is alert. Mental status is at baseline.  Psychiatric:        Mood and Affect: Mood normal.       Disposition: Status is: Inpatient Remains inpatient appropriate because: IV drips, IV antibx and ongoing GI consult  Planned Discharge Destination: Barriers to discharge: As noted above     Time spent: 35 minutes  Author: Alicia Ackert , MD 04/19/2024 12:30 PM  For on call review [email or web address].

## 2024-04-19 NOTE — H&P (Signed)
 History and Physical    Hannah Khan FMW:[id number] DOB: 07/09/1956 DOA: 04/18/2024  PCP: Regino Slater, MD   Patient coming from: Transfer from [facility] ED  Chief Complaint:  Chief Complaint  Patient presents with   Leg Swelling    HPI:  Hannah Khan is a 68 y.o. female with hx of decompensated Nash cirrhosis, prior history of EV bleed s/p BRTO, failed TIPS, chronic anemia and thrombocytopenia, A-fib not on anticoagulation, mild to moderate aortic stenosis, HFpEF, CKD stage IIIb, diabetes type 2, hypertension, morbid obesity, OSA on CPAP, who presented due to concern for left thigh rash to [facility] OP clinic and referred to ED for r/o DVT, now transferred from [facility] ED for further treatment of cellulitis, possible pneumonia, and A-fib RVR.   Reports feeling unwell x 1 day with lightheadedness/dizziness, low blood pressure at home (unable to tell me exact readings).  Also noted a rash on her left inner thigh.  Reports she has had cellulitis in the past but unclear if she has had in this region before.   I will  She denies any fever, chills, cough/cold symptoms, abdominal pain, nausea, vomiting, dysuria.  She reports that she does have intermittent dark stools since last February, but does not really keep track of when this is happening.  Unable to tell me how recently that she has been having dark stool.  Denies any bright red blood in stool.  No hematemesis.  Denies any other bleeding sites.  No alcohol  use, not on antiplatelet/anticoagulant, no NSAID use.   Review of Systems:  ROS complete and negative except as marked above   Allergies  Allergen Reactions   Celebrex [Celecoxib] Other (See Comments)    speech slurred with celebrex   Shellfish Allergy Swelling, Rash and Other (See Comments)    TINGLING AND SWELLING OF LIPS. LARGE WHELPS, QUARTER-SIZE   Barium-Containing Compounds Other (See Comments)    TACHYCARDIA   Prednisone Other (See Comments)    TACHYCARDIA   Statins Other  (See Comments)    AGGRAVATED FIBROMYALGIA   Fentanyl  Other (See Comments)    Hallucinations    Ibuprofen  Other (See Comments)    UNSPECIFIED SPECIFIC REACTION >> DUE TO PLATELETS   Sglt2 Inhibitors Other (See Comments)    Ozempic , Severe gastroparesis    Tramadol  Other (See Comments)    Hallucination    Tylenol  [Acetaminophen ] Other (See Comments)    UNSPECIFIED SPECIFIC REACTION >> DUE TO PLATELETS   Zetia [Ezetimibe] Other (See Comments)    Headache    Prior to Admission medications   Medication Sig Start Date End Date Taking? Authorizing Provider  albuterol  (VENTOLIN  HFA) 108 (90 Base) MCG/ACT inhaler Inhale 1-2 puffs into the lungs every 6 (six) hours as needed for wheezing or shortness of breath.    [provider]  benzonatate  (TESSALON ) 100 MG capsule Take 100 mg by mouth 3 (three) times daily as needed for cough. 01/30/24   [provider]  Continuous Glucose Sensor (FREESTYLE LIBRE 3 PLUS SENSOR) MISC Apply 1 each topically as directed. Place on arm as directed and change every 15 days. 01/12/24   [provider]  digoxin  (LANOXIN ) 0.125 MG tablet TAKE 1 TABLET BY MOUTH EVERY DAY 04/17/24   Sabharwal, Aditya, DO  lactulose  (CHRONULAC ) 10 GM/15ML solution Take 20 g by mouth 3 (three) times daily. 03/17/21   [provider]  Lancets (ONETOUCH DELICA PLUS Farrell) MISC Apply topically 3 (three) times daily. 04/10/20   [provider]  metolazone  (  ZAROXOLYN ) 2.5 MG tablet TAKE 1 TABLET (2.5 MG TOTAL) BY MOUTH AS NEEDED (AS DIRECTED BY HF CLINIC). 10/19/23   Sabharwal, Aditya, DO  metoprolol  tartrate (LOPRESSOR ) 25 MG tablet Take 1 tablet (25 mg total) by mouth 2 (two) times daily. 07/11/23   Elgergawy, Brayton RAMAN, MD  midodrine  (PROAMATINE ) 10 MG tablet Take 1 tablet (10 mg total) by mouth 3 (three) times daily. 04/18/23   Walker, Caitlin S, NP  Multiple Vitamins-Minerals (PRESERVISION AREDS 2) CAPS Take 1 capsule by mouth daily.     [provider]  [facility] VERIO test strip 1 each 3 (three) times daily. 04/13/20   [provider]  pantoprazole  (PROTONIX ) 40 MG tablet Take 40 mg by mouth 2 (two) times daily. 11/17/23   [provider]  potassium chloride  (MICRO-K ) 10 MEQ CR capsule Take 10 capsules (100 mEq total) by mouth 2 (two) times daily. Take an extra 4 tablets when taking Metolazone . 06/15/23   Colletta Manuelita Garre, PA-C  Propylene Glycol (SYSTANE BALANCE) 0.6 % SOLN Place 1 drop into both eyes 2 (two) times daily.    [provider]  spironolactone  (ALDACTONE ) 25 MG tablet TAKE 3 TABLETS BY MOUTH DAILY 03/05/24   Sabharwal, Aditya, DO  spironolactone  (ALDACTONE ) 50 MG tablet Take 50 mg by mouth daily. Take with 25 mg tablet for a total dose of 75 mg daily. 01/18/24   [provider]  torsemide  (DEMADEX ) 20 MG tablet Take 5 tablets (100 mg total) by mouth every morning for 5 days, THEN 2 tablets (40 mg total) every evening for 5 days, THEN 5 tablets (100 mg total) daily. Patient taking differently: Take 4 tablets (80 mg total) by mouth in the morning and 2 tablets (40 mg total) in the evening 08/02/23 08/11/24  Sabharwal, Aditya, DO  TRESIBA  FLEXTOUCH 100 UNIT/ML FlexTouch Pen Inject 24 Units into the skin daily. (TITRATE WHEN DIRECTED UP TO 50 UNITS) 01/22/24   [provider]  XIFAXAN  550 MG TABS tablet Take 550 mg by mouth 2 (two) times daily. 03/13/21   [provider]    Past Medical History:  Diagnosis Date   Anemia    hx of   Aortic stenosis 02/2023   Echo 03/07/23 LVEF 55-60%, mild to moderate AS   Arthritis    knees   CHF (congestive heart failure) (HCC)    chronic diastolic   Diabetes mellitus without complication (HCC)    type 2   Dyspnea    Dysrhythmia    Fibromyalgia    Gastric varices    large   H/O transfusion of platelets    Heart murmur    never has caused any problems   Hx of colonic polyps    s/p partial colectomy   Hyperlipidemia     Hypertension    Joint pain    in knees and back spasms   Left leg swelling    wear compression hose   Liver cirrhosis secondary to NASH (nonalcoholic steatohepatitis) (HCC) dx nov 2014   had enlarged spleen also    Morbid obesity (HCC)    Persistent atrial fibrillation (HCC)    Pneumonia 10/2016   x 5   PONV (postoperative nausea and vomiting)    in past none recent   Portal hypertension (HCC)    Sleep apnea    uses cpap, setting varies between 14-20   Spleen enlarged    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 [facility])     Past Surgical History:  Procedure Laterality Date  BREAST LUMPECTOMY WITH RADIOACTIVE SEED LOCALIZATION Right 08/29/2017   Procedure: RIGHT BREAST LUMPECTOMY WITH RADIOACTIVE SEEDS LOCALIZATION;  Surgeon: Vanderbilt Ned, MD;  Location: [facility] OR;  Service: General;  Laterality: Right;   CESAREAN SECTION  1989   CHOLECYSTECTOMY  20 yrs ago   COLECTOMY  2011   COLONOSCOPY     COLONOSCOPY N/A 09/05/2018   Procedure: COLONOSCOPY;  Surgeon: Burnette Fallow, MD;  Location: [facility] ENDOSCOPY;  Service: Endoscopy;  Laterality: N/A;   COLONOSCOPY WITH PROPOFOL  Bilateral 05/04/2022   Procedure: COLONOSCOPY WITH PROPOFOL ;  Surgeon: Burnette Fallow, MD;  Location: [facility] ENDOSCOPY;  Service: Gastroenterology;  Laterality: Bilateral;   DILATATION & CURETTAGE/HYSTEROSCOPY WITH MYOSURE N/A 05/06/2016   Procedure: DILATATION & CURETTAGE/HYSTEROSCOPY WITH MYOSURE WITH POLYPECTOMY;  Surgeon: Delon Prude, DO;  Location: [facility] ORS;  Service: Gynecology;  Laterality: N/A;   ESOPHAGOGASTRODUODENOSCOPY (EGD) WITH PROPOFOL  N/A 09/04/2013   Procedure: ESOPHAGOGASTRODUODENOSCOPY (EGD) WITH PROPOFOL ;  Surgeon: Fallow Burnette, MD;  Location: [facility];  Service: Endoscopy;  Laterality: N/A;   ESOPHAGOGASTRODUODENOSCOPY (EGD) WITH PROPOFOL  Bilateral 05/04/2022   Procedure: ESOPHAGOGASTRODUODENOSCOPY (EGD) WITH PROPOFOL ;  Surgeon: Burnette Fallow, MD;  Location: [facility];  Service: Gastroenterology;   Laterality: Bilateral;   IR EMBO ART  VEN HEMORR LYMPH EXTRAV  INC GUIDE ROADMAPPING  06/30/2023   IR INTRAVASCULAR ULTRASOUND NON CORONARY  12/23/2022   IR RADIOLOGIST EVAL & MGMT  09/21/2022   IR RADIOLOGIST EVAL & MGMT  10/06/2022   IR RADIOLOGIST EVAL & MGMT  11/02/2022   IR RADIOLOGIST EVAL & MGMT  01/31/2023   IR RADIOLOGIST EVAL & MGMT  05/31/2023   IR RADIOLOGIST EVAL & MGMT  08/21/2023   IR RADIOLOGIST EVAL & MGMT  12/25/2023   IR TIPS  12/23/2022   IR TIPS  06/30/2023   IR US  GUIDE VASC ACCESS LEFT  06/30/2023   IR US  GUIDE VASC ACCESS RIGHT  12/23/2022   IR US  GUIDE VASC ACCESS RIGHT  06/30/2023   IR US  GUIDE VASC ACCESS RIGHT  06/30/2023   KNEE ARTHROSCOPY  yrs ago   bilateral, one done x 1, one done twice   POLYPECTOMY  09/05/2018   Procedure: POLYPECTOMY;  Surgeon: Burnette Fallow, MD;  Location: [facility];  Service: Endoscopy;;   RADIOLOGY WITH ANESTHESIA N/A 12/23/2022   Procedure: TIPS;  Surgeon: Jennefer Ester PARAS, MD;  Location: [facility] OR;  Service: Radiology;  Laterality: N/A;   RIGHT HEART CATH N/A 05/19/2023   Procedure: RIGHT HEART CATH;  Surgeon: Gardenia Led, DO;  Location: [facility] INVASIVE CV LAB;  Service: Cardiovascular;  Laterality: N/A;   TIPS PROCEDURE N/A 06/30/2023   Procedure: TRANS-JUGULAR INTRAHEPATIC PORTAL SHUNT (TIPS);  Surgeon: Jennefer Ester PARAS, MD;  Location: [facility] OR;  Service: Radiology;  Laterality: N/A;     reports that she quit smoking about 49 years ago. Her smoking use included cigarettes. She started smoking about 52 years ago. She has a 3 pack-year smoking history. She has never used smokeless tobacco. She reports that she does not currently use alcohol . She reports that she does not use drugs.  Family History  Problem Relation Age of Onset   Heart failure Mother    Cancer Mother    Hyperlipidemia Mother    Congestive Heart Failure Mother    Stroke Mother    Lung cancer Father    Cancer Father    Cancer Brother    Hyperlipidemia Brother    Stroke Other     Breast cancer Neg Hx      Physical Exam:  Vitals:   04/19/24 0311 04/19/24 0350 04/19/24 0535 04/19/24 0605  BP:   (!) 121/47 (!) 140/45  Pulse:   (!) 106 (!) 111  Resp:   12 10  Temp: 99.7 F (37.6 C)  98.7 F (37.1 C) 98.6 F (37 C)  TempSrc: Oral  Oral Oral  SpO2:   100% 100%  Weight:  135.5 kg    Height:        Gen: Awake, alert, chronically ill-appearing CV: Regular, normal S1, S2, 3/6 crescendo decrescendo murmur at the RUSB Resp: Normal WOB, coarse but otherwise clear Abd: Obese, normoactive, nontender MSK: Symmetric, there is 1+ nonpitting edema. Skin: L inner thigh with poorly demarcated large erythematous region, with some scattered petechiae, increased warmth, no induration. Skin changes of venous stasis bilaterally, scattered excoriated lesions Neuro: Alert and interactive  Psych: euthymic, appropriate    Data review:   Labs reviewed, notable for:   Lactate 2.602 NA 131 Bicarb 21, AG 12 BUN 40, creatinine 2.2 Blood glucose 275 T. bili 2.7, indirect predominant Other LFT within normal limit Hemoglobin 5.9 Platelets 26  dig 1.5 FOBT negative  Micro:  Results for orders placed or performed during the hospital encounter of 04/18/24  Culture, blood (routine x 2)     Status: None (Preliminary result)   Collection Time: 04/18/24  4:46 PM   Specimen: BLOOD RIGHT ARM  Result Value Ref Range Status   Specimen Description   Final    BLOOD RIGHT ARM Performed at [facility], [address] [place], [place] [address]    Special Requests   Final    BOTTLES DRAWN AEROBIC AND ANAEROBIC Blood Culture adequate volume Performed at [facility], [address] [place], [place] [address]    Culture PENDING  Incomplete   Report Status PENDING  Incomplete  Culture, blood (routine x 2)     Status: None (Preliminary result)   Collection Time: 04/18/24  4:51 PM   Specimen: BLOOD LEFT ARM  Result Value Ref Range Status   Specimen Description    Final    BLOOD LEFT ARM Performed at [facility], [address] [place], [place] [address]    Special Requests   Final    BOTTLES DRAWN AEROBIC AND ANAEROBIC Blood Culture adequate volume Performed at [facility], [address] [place], [place] [address]    Culture PENDING  Incomplete   Report Status PENDING  Incomplete  Resp panel by RT-PCR (RSV, Flu A&B, Covid) Anterior Nasal Swab     Status: None   Collection Time: 04/19/24  1:22 AM   Specimen: Anterior Nasal Swab  Result Value Ref Range Status   SARS Coronavirus 2 by RT PCR NEGATIVE NEGATIVE Final    Comment: (NOTE) SARS-CoV-2 target nucleic acids are NOT DETECTED.  The SARS-CoV-2 RNA is generally detectable in upper respiratory specimens during the acute phase of infection. The lowest concentration of SARS-CoV-2 viral copies this assay can detect is 138 copies/mL. A negative result does not preclude SARS-Cov-2 infection and should not be used as the sole basis for treatment or other patient management decisions. A negative result may occur with  improper specimen collection/handling, submission of specimen other than nasopharyngeal swab, presence of viral mutation(s) within the areas targeted by this assay, and inadequate number of viral copies(<138 copies/mL). A negative result must be combined with clinical observations, patient history, and epidemiological information. The expected result is Negative.  Fact Sheet for Patients:  BloggerCourse.com  Fact Sheet for Healthcare Providers:  SeriousBroker.it  This test is no t yet approved or cleared by the [place]  FDA and  has been authorized for detection and/or diagnosis of SARS-CoV-2 by FDA under an Emergency Use Authorization (EUA). This EUA will remain  in effect (meaning this test can be used) for the duration of the COVID-19 declaration under Section 564(b)(1) of the Act, 21 U.S.C.section  360bbb-3(b)(1), unless the authorization is terminated  or revoked sooner.       Influenza A by PCR NEGATIVE NEGATIVE Final   Influenza B by PCR NEGATIVE NEGATIVE Final    Comment: (NOTE) The Xpert Xpress SARS-CoV-2/FLU/RSV plus assay is intended as an aid in the diagnosis of influenza from Nasopharyngeal swab specimens and should not be used as a sole basis for treatment. Nasal washings and aspirates are unacceptable for Xpert Xpress SARS-CoV-2/FLU/RSV testing.  Fact Sheet for Patients: BloggerCourse.com  Fact Sheet for Healthcare Providers: SeriousBroker.it  This test is not yet approved or cleared by the [place]  FDA and has been authorized for detection and/or diagnosis of SARS-CoV-2 by FDA under an Emergency Use Authorization (EUA). This EUA will remain in effect (meaning this test can be used) for the duration of the COVID-19 declaration under Section 564(b)(1) of the Act, 21 U.S.C. section 360bbb-3(b)(1), unless the authorization is terminated or revoked.     Resp Syncytial Virus by PCR NEGATIVE NEGATIVE Final    Comment: (NOTE) Fact Sheet for Patients: BloggerCourse.com  Fact Sheet for Healthcare Providers: SeriousBroker.it  This test is not yet approved or cleared by the [place]  FDA and has been authorized for detection and/or diagnosis of SARS-CoV-2 by FDA under an Emergency Use Authorization (EUA). This EUA will remain in effect (meaning this test can be used) for the duration of the COVID-19 declaration under Section 564(b)(1) of the Act, 21 U.S.C. section 360bbb-3(b)(1), unless the authorization is terminated or revoked.  Performed at [facility], [address] [place], [place] [address]   MRSA Next Gen by PCR, Nasal     Status: None   Collection Time: 04/19/24  1:22 AM   Specimen: Anterior Nasal Swab  Result Value Ref Range  Status   MRSA by PCR Next Gen NOT DETECTED NOT DETECTED Final    Comment: (NOTE) The GeneXpert MRSA Assay (FDA approved for NASAL specimens only), is one component of a comprehensive MRSA colonization surveillance program. It is not intended to diagnose MRSA infection nor to guide or monitor treatment for MRSA infections. Test performance is not FDA approved in patients less than 65 years old. Performed at [facility], [address], [place], [place] [address]     Imaging reviewed:  DG Chest 1 View Result Date: 04/18/2024 CLINICAL DATA:  Shortness of breath EXAM: CHEST  1 VIEW COMPARISON:  Chest radiograph dated 02/05/2024 FINDINGS: Normal lung volumes. Chronic-appearing pulmonary vascular congestion. Patchy left retrocardiac opacity. Unchanged blunting of the left costophrenic angle. No pneumothorax. Similar enlarged cardiomediastinal silhouette. No acute osseous abnormality. IMPRESSION: 1. Patchy left retrocardiac opacity, which may represent atelectasis, aspiration, or pneumonia. 2. Chronic-appearing pulmonary vascular congestion. 3. Unchanged blunting of the left costophrenic angle, which may represent a small pleural effusion. Electronically Signed   By: Limin  Xu M.D.   On: 04/18/2024 18:51   US  Venous Img Lower  Left (DVT Study) Result Date: 04/18/2024 CLINICAL DATA:  Leg pain with medial thigh redness. EXAM: LEFT LOWER EXTREMITY VENOUS DOPPLER ULTRASOUND TECHNIQUE: Gray-scale sonography with compression, as  well as color and duplex ultrasound, were performed to evaluate the deep venous system(s) from the level of the common femoral vein through the popliteal and proximal calf veins. COMPARISON:  None Available. FINDINGS: VENOUS There is limited evaluation of the bilateral common femoral veins and left saphenofemoral junction. The posterior tibial and peroneal veins are not well visualized in the left lower extremity. Normal compressibility of the common femoral,  superficial femoral, and popliteal veins. Visualized portions of profunda femoral vein and great saphenous vein unremarkable. No filling defects to suggest DVT on grayscale or color Doppler imaging. Doppler waveforms show normal direction of venous flow, normal respiratory plasticity and response to augmentation. Limited views of the contralateral common femoral vein are unremarkable. OTHER None. Limitations: none IMPRESSION: No evidence of deep venous thrombosis in the left lower extremity. Electronically Signed   By: Greig Pique M.D.   On: 04/18/2024 17:49    ED Course:  treated with 1 L IV fluid, ceftriaxone , azithromycin , morphine , Zofran .  Found to be in A-fib RVR and started on diltiazem  drip.   Assessment/Plan:  68 y.o. female with hx decompensated Nash cirrhosis, prior history of EV bleed s/p BRTO, failed TIPS, chronic anemia and thrombocytopenia, A-fib not on anticoagulation, mild to moderate aortic stenosis, HFpEF, CKD stage IIIb, diabetes type 2, hypertension, morbid obesity, OSA on CPAP, who presented due to concern for left thigh rash to [facility] OP clinic and referred to ED for r/o DVT, now transferred from [facility] ED for further treatment of sepsis secondary to cellulitis v possible pneumonia, and A-fib RVR.    Cellulitis LLE  Less likely pneumonia  Sepsis secondary to above, present on admission, improving  Lactic acidosis, improving  Acute onset L thigh rash, denies respiratory symptoms.  On initial presentation febrile Tmax 39C, tachycardic mostly in 120s (Afib RVR), RR low 20s, placed on 2 L O2 but unclear if desat. WBC 4.2.  Lactate 2.6 -> 2 with IV fluid. Exam notable for changes of cellulitis left thigh.  Chest x-ray with patchy left retrocardiac opacity, chronic pulmonary vascular congestion, small left pleural effusion. Duplex negative for DVT - Continue vancomycin  pharmacy to dose, ceftriaxone  2 g IV every 24 hours, azithromycin  500 mg daily to cover for cellulitis/CAP; Likely  can deescalate to Cefazolin  for cellulitis coverage  - Follow-up blood cultures, check sputum culture, RVP - S/p 1 L IV fluid, continue oral hydration - Hold home diuretics  Afib with RVR  At presentation RVR with rates mostly in the 120s.  History of paroxysmal A-fib, not on anticoagulation with her history of past EV bleed.  Suspect trigger for A-fib underlying sepsis, anemia - Started on diltiazem  drip at outside ED, continue for now - Resume home metoprolol  25 mg twice daily, digoxin  125 mcg daily and should be able to wean off of diltiazem  drip  Acute on chronic anemia  Chronic thrombocytopenia Question of subacute / chronic GI bleed without active component  Likely IDA, ? Hemolysis  History chronic anemia with hemoglobin ~ 7.  Down to 5.9 -> stable at 6AM this AM. Likely background of IDA with low ferritin and low normal TSAT. Question if may have component of hemolysis with elevated indirect bili. Otherwise reporting intermittent dark stool, previously has been FOBT + although this admission is actually negative, and no active bleeding sites. Her last EGD was in 8/'23, considering hx of varicies and possible intermittent / more chronic UGIB will involve GI re: possible need for endoscopy.  - Transfuse 1 unit RBC, Transfuse  for Hb < 7 or active bleeding  - If any active bleeding would also transfuse platelet < 50, held off for now.  - GI consulted, messaged [facility] Dr. Kriss, group to consult in a.m.  - Sent iron panel, B12, folate, LDH, haptoglobin, reticulocytes - Hold off on iron during active infection but would benefit of starting -Serial blood count, next CBC ordered for 2 PM  Borderline AKI CKD stage IIIb Baseline creatinine variable, suspect 1.6-1.8.  Elevated to 2.2 on admission.  Suspect prerenal in setting of sepsis, anemia -IV fluids per above  Chronic medical problems: decompensated Hollie cirrhosis: Continue home lactulose , rifaximin .  Holding home torsemide  and  spironolactone  in setting of sepsis. prior history of EV bleed s/p BRTO, failed TIPS: Noted, last EGD 8/' 23 with grade 1 EV mild to moderate aortic stenosis: Noted needs surveillance echo outpatient HFpEF: Without acute exacerbation, see volume management and cirrhosis above. Diabetes type 2: With hyperglycemia, Home insulin  regimen is Tresiba  32 units daily.  Modest reduction in Semglee  24 units daily while inpatient, SSI for sensitive. Chronic hypotension: Continue home midodrine  10 mg 3 times daily Morbid obesity,: Would benefit from weight loss outpatient OSA: CPAP at bedtime   Body mass index is 46.79 kg/m.    DVT prophylaxis:  SCDs Code Status:  Full Code Diet:  Diet Orders (From admission, onward)     Start     Ordered   04/19/24 0504  Diet clear liquid Room service appropriate? Yes; Fluid consistency: Thin  Diet effective now       Question Answer Comment  Room service appropriate? Yes   Fluid consistency: Thin      04/19/24 0503           Family Communication:  None   Consults:  [facility]   Admission status:   Inpatient, Step Down Unit  Severity of Illness: The appropriate patient status for this patient is INPATIENT. Inpatient status is judged to be reasonable and necessary in order to provide the required intensity of service to ensure the patient's safety. [name] presenting symptoms, physical exam findings, and initial radiographic and laboratory data in the context of their chronic comorbidities is felt to place them at high risk for further clinical deterioration. Furthermore, it is not anticipated that the patient will be medically stable for discharge from the hospital within 2 midnights of admission.   * I certify that at the point of admission it is my clinical judgment that the patient will require inpatient hospital care spanning beyond 2 midnights from the point of admission due to high intensity of service, high risk for further deterioration and high  frequency of surveillance required.*   Dorn Dawson, MD [facility]  How to contact the [facility] Attending or Consulting provider 7A - 7P or covering provider during after hours 7P -7A, for this patient.  Check the care team in [facility] and look for a) attending/consulting [facility] provider listed and b) the [facility] team listed Log into [email or web address] and use [facility]'s universal password to access. If you do not have the password, please contact the hospital operator. Locate the [facility] provider you are looking for under [facility] and page to a number that you can be directly reached. If you still have difficulty reaching the provider, please page the [facility] (Director on Call) for the Hospitalists listed on amion for assistance.  04/19/2024, 6:35 AM

## 2024-04-19 NOTE — Consult Note (Signed)
 [facility] Gastroenterology Consult  Referring Provider: No ref. provider found Primary Care Physician:  Regino Slater, MD Primary Gastroenterologist: [facility] Burnette  Reason for Consultation: Anemia, history of varices  SUBJECTIVE:   HPI: Hannah Khan is a 68 y.o. female with known history of cirrhosis secondary to metabolic associated liver disease decompensated by esophageal varices and gastric varices.  EGD and colonoscopy 05/04/2022 showed small esophageal varix, isolated gastric varix, portal hypertensive gastropathy and diverticulosis.  BRTO of gastric varix completed on 06/30/2023, TIPS attempted at that time though unsuccessful and complicated by hemoperitoneum.  She also has medical history significant for heart failure with preserved ejection fraction and atrial fibrillation (no anticoagulant use), diabetes mellitus, chronic kidney disease.  Patient was most recently seen by [facility] hepatology, Dr. Myrna, on 02/21/2024 and was recommended to consider repeat EGD to monitor esophageal varices as no TIPS was able to be completed in October 2024.  She was recommended to have updated echocardiogram as well.  Patient presented to [facility] walk-in clinic with concern for left lower extremity redness and concern for cellulitis as this is occurred in the past.  Given concern for possible DVT she was sent to the emergency department.  Today, patient noted that she is doing fairly well.  She has no nausea or vomiting.  She has chronic abdominal pain.  She is having no blood per rectum, no melena or hematochezia.  No chest pain.  She has some shortness of breath.  Past Medical History:  Diagnosis Date   Anemia    hx of   Aortic stenosis 02/2023   Echo 03/07/23 LVEF 55-60%, mild to moderate AS   Arthritis    knees   CHF (congestive heart failure) (HCC)    chronic diastolic   Diabetes mellitus without complication (HCC)    type 2   Dyspnea    Dysrhythmia    Fibromyalgia    Gastric varices     large   H/O transfusion of platelets    Heart murmur    never has caused any problems   Hx of colonic polyps    s/p partial colectomy   Hyperlipidemia    Hypertension    Joint pain    in knees and back spasms   Left leg swelling    wear compression hose   Liver cirrhosis secondary to NASH (nonalcoholic steatohepatitis) (HCC) dx nov 2014   had enlarged spleen also    Morbid obesity (HCC)    Persistent atrial fibrillation (HCC)    Pneumonia 10/2016   x 5   PONV (postoperative nausea and vomiting)    in past none recent   Portal hypertension (HCC)    Sleep apnea    uses cpap, setting varies between 14-20   Spleen enlarged    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 [facility])    Past Surgical History:  Procedure Laterality Date   BREAST LUMPECTOMY WITH RADIOACTIVE SEED LOCALIZATION Right 08/29/2017   Procedure: RIGHT BREAST LUMPECTOMY WITH RADIOACTIVE SEEDS LOCALIZATION;  Surgeon: Vanderbilt Ned, MD;  Location: [facility] OR;  Service: General;  Laterality: Right;   CESAREAN SECTION  1989   CHOLECYSTECTOMY  20 yrs ago   COLECTOMY  2011   COLONOSCOPY     COLONOSCOPY N/A 09/05/2018   Procedure: COLONOSCOPY;  Surgeon: Burnette Fallow, MD;  Location: [facility] ENDOSCOPY;  Service: Endoscopy;  Laterality: N/A;   COLONOSCOPY WITH PROPOFOL  Bilateral 05/04/2022   Procedure: COLONOSCOPY WITH PROPOFOL ;  Surgeon: Burnette Fallow, MD;  Location: [facility] ENDOSCOPY;  Service: Gastroenterology;  Laterality:  Bilateral;   DILATATION & CURETTAGE/HYSTEROSCOPY WITH MYOSURE N/A 05/06/2016   Procedure: DILATATION & CURETTAGE/HYSTEROSCOPY WITH MYOSURE WITH POLYPECTOMY;  Surgeon: Delon Prude, DO;  Location: [facility] ORS;  Service: Gynecology;  Laterality: N/A;   ESOPHAGOGASTRODUODENOSCOPY (EGD) WITH PROPOFOL  N/A 09/04/2013   Procedure: ESOPHAGOGASTRODUODENOSCOPY (EGD) WITH PROPOFOL ;  Surgeon: Elsie Cree, MD;  Location: [facility] ENDOSCOPY;  Service: Endoscopy;  Laterality: N/A;   ESOPHAGOGASTRODUODENOSCOPY (EGD) WITH PROPOFOL  Bilateral  05/04/2022   Procedure: ESOPHAGOGASTRODUODENOSCOPY (EGD) WITH PROPOFOL ;  Surgeon: Cree Elsie, MD;  Location: [facility] ENDOSCOPY;  Service: Gastroenterology;  Laterality: Bilateral;   IR EMBO ART  VEN HEMORR LYMPH EXTRAV  INC GUIDE ROADMAPPING  06/30/2023   IR INTRAVASCULAR ULTRASOUND NON CORONARY  12/23/2022   IR RADIOLOGIST EVAL & MGMT  09/21/2022   IR RADIOLOGIST EVAL & MGMT  10/06/2022   IR RADIOLOGIST EVAL & MGMT  11/02/2022   IR RADIOLOGIST EVAL & MGMT  01/31/2023   IR RADIOLOGIST EVAL & MGMT  05/31/2023   IR RADIOLOGIST EVAL & MGMT  08/21/2023   IR RADIOLOGIST EVAL & MGMT  12/25/2023   IR TIPS  12/23/2022   IR TIPS  06/30/2023   IR US  GUIDE VASC ACCESS LEFT  06/30/2023   IR US  GUIDE VASC ACCESS RIGHT  12/23/2022   IR US  GUIDE VASC ACCESS RIGHT  06/30/2023   IR US  GUIDE VASC ACCESS RIGHT  06/30/2023   KNEE ARTHROSCOPY  yrs ago   bilateral, one done x 1, one done twice   POLYPECTOMY  09/05/2018   Procedure: POLYPECTOMY;  Surgeon: Cree Elsie, MD;  Location: [facility] END[facility];  Service: Endoscopy;;   RADIOLOGY WITH ANESTHESIA N/A 12/23/2022   Procedure: TIPS;  Surgeon: Jennefer Ester PARAS, MD;  Location: [facility] OR;  Service: Radiology;  Laterality: N/A;   RIGHT HEART CATH N/A 05/19/2023   Procedure: RIGHT HEART CATH;  Surgeon: Gardenia Led, DO;  Location: [facility] INVASIVE CV LAB;  Service: Cardiovascular;  Laterality: N/A;   TIPS PROCEDURE N/A 06/30/2023   Procedure: TRANS-JUGULAR INTRAHEPATIC PORTAL SHUNT (TIPS);  Surgeon: Jennefer Ester PARAS, MD;  Location: [facility] OR;  Service: Radiology;  Laterality: N/A;   Prior to Admission medications   Medication Sig Start Date End Date Taking? Authorizing Provider  albuterol  (VENTOLIN  HFA) 108 (90 Base) MCG/ACT inhaler Inhale 1-2 puffs into the lungs every 6 (six) hours as needed for wheezing or shortness of breath.   Yes [provider]  Continuous Glucose Sensor (FREESTYLE LIBRE 3 PLUS SENSOR) MISC Apply 1 each topically as directed. Place on arm as directed and  change every 15 days. 01/12/24  Yes [provider]  digoxin  (LANOXIN ) 0.125 MG tablet TAKE 1 TABLET BY MOUTH EVERY DAY 04/17/24  Yes Sabharwal, Aditya, DO  lactulose  (CHRONULAC ) 10 GM/15ML solution Take 20 g by mouth 3 (three) times daily. 03/17/21  Yes [provider]  metolazone  (ZAROXOLYN ) 2.5 MG tablet TAKE 1 TABLET (2.5 MG TOTAL) BY MOUTH AS NEEDED (AS DIRECTED BY HF CLINIC). Patient taking differently: Take 2.5 mg by mouth as needed (weight gain and when taking fluid pills or AS DIRECTED BY HF CLINIC). 10/19/23  Yes Sabharwal, Aditya, DO  metoprolol  tartrate (LOPRESSOR ) 25 MG tablet Take 1 tablet (25 mg total) by mouth 2 (two) times daily. Patient taking differently: Take 25 mg by mouth 2 (two) times daily. Total of 75 mg in am and pm 07/11/23  Yes Elgergawy, Brayton RAMAN, MD  midodrine  (PROAMATINE ) 10 MG tablet Take 1 tablet (10 mg total) by mouth 3 (three) times daily. 04/18/23  Yes Vannie Reche RAMAN, NP  Multiple Vitamins-Minerals (PRESERVISION AREDS 2) CAPS Take 1 capsule by mouth daily.   Yes [provider]  pantoprazole  (PROTONIX ) 40 MG tablet Take 40 mg by mouth 2 (two) times daily. 11/17/23  Yes [provider]  potassium chloride  (MICRO-K ) 10 MEQ CR capsule Take 10 capsules (100 mEq total) by mouth 2 (two) times daily. Take an extra 4 tablets when taking Metolazone . Patient taking differently: Take 40 mEq by mouth 2 (two) times daily. Take an extra 4 tablets when taking Metolazone . 06/15/23  Yes Colletta Manuelita Garre, PA-C  Propylene Glycol (SYSTANE BALANCE) 0.6 % SOLN Place 1 drop into both eyes 2 (two) times daily.   Yes [provider]  spironolactone  (ALDACTONE ) 25 MG tablet TAKE 3 TABLETS BY MOUTH DAILY Patient taking differently: Take 25 mg by mouth daily. Total of 75 mg in am and pm 03/05/24  Yes Sabharwal, Aditya, DO  spironolactone  (ALDACTONE ) 50 MG tablet Take 50 mg by mouth daily. Total of 75 mg in am and pm 01/18/24  Yes [provider]  torsemide  (DEMADEX ) 20 MG tablet Take 5 tablets (100 mg total) by mouth every morning for 5 days, THEN 2 tablets (40 mg total) every evening for 5 days, THEN 5 tablets (100 mg total) daily. Patient taking differently: Take 4 tablets (80 mg total) by mouth in the morning and 2 tablets (40 mg total) in the evening 08/02/23 08/11/24 Yes Sabharwal, Aditya, DO  TRESIBA  FLEXTOUCH 100 UNIT/ML FlexTouch Pen Inject 32 Units into the skin daily. (TITRATE WHEN DIRECTED UP TO 50 UNITS) 01/22/24  Yes [provider]  XIFAXAN  550 MG TABS tablet Take 550 mg by mouth 2 (two) times daily. 03/13/21  Yes [provider]  ONETOUCH VERIO test strip 1 each 3 (three) times daily. 04/13/20   [provider]   Current Facility-Administered Medications  Medication Dose Route Frequency Provider Last Rate Last Admin   0.9 %  sodium chloride  infusion (Manually program via Guardrails IV Fluids)   Intravenous Once Sira, Zackery, MD       0.9 %  sodium chloride  infusion   Intravenous PRN Butler, Michael C, MD       albuterol  (PROVENTIL ) (2.5 MG/3ML) 0.083% nebulizer solution 2.5 mg  2.5 mg Nebulization Q4H PRN Segars, Dorn, MD       azithromycin  (ZITHROMAX ) tablet 500 mg  500 mg Oral QHS Keturah Dorn, MD       Chlorhexidine  Gluconate Cloth 2 % PADS 6 each  6 each Topical QHS Kakrakandy, Arshad N, MD   6 each at 04/19/24 0115   digoxin  (LANOXIN ) tablet 125 mcg  125 mcg Oral Daily Segars, Jonathan, MD   125 mcg at 04/19/24 1026   diltiazem  (CARDIZEM ) 125 mg in dextrose  5% 125 mL (1 mg/mL) infusion  5-15 mg/hr Intravenous Continuous Butler, Michael C, MD 12.5 mL/hr at 04/19/24 0716 12.5 mg/hr at 04/19/24 0716   insulin  aspart (novoLOG ) injection 0-9 Units  0-9 Units Subcutaneous TID WC Segars, Jonathan, MD   5 Units at 04/19/24 0825   insulin  glargine-yfgn (SEMGLEE ) injection 24 Units  24 Units Subcutaneous Daily Segars, Jonathan, MD   24 Units at 04/19/24 1026   lactulose  (CHRONULAC ) 10 GM/15ML  solution 20 g  20 g Oral TID Segars, Jonathan, MD   20 g at 04/19/24 1025   lidocaine  (LIDODERM ) 5 % 2 patch  2 patch Transdermal Daily PRN Segars, Jonathan, MD   2 patch at 04/19/24 0410   melatonin  tablet 6 mg  6 mg Oral QHS PRN Keturah Carrier, MD       methocarbamol  (ROBAXIN ) tablet 500 mg  500 mg Oral Q8H PRN Keturah Carrier, MD       metoprolol  tartrate (LOPRESSOR ) tablet 25 mg  25 mg Oral BID Segars, Jonathan, MD   25 mg at 04/19/24 1026   midodrine  (PROAMATINE ) tablet 10 mg  10 mg Oral TID with meals Keturah Carrier, MD   10 mg at 04/19/24 9177   ondansetron  (ZOFRAN ) injection 4 mg  4 mg Intravenous Q6H PRN Keturah Carrier, MD       Oral care mouth rinse  15 mL Mouth Rinse PRN Franky Redia SAILOR, MD       oxyCODONE  (Oxy IR/ROXICODONE ) immediate release tablet 2.5 mg  2.5 mg Oral Q6H PRN Keturah Carrier, MD   2.5 mg at 04/19/24 9590   Or   oxyCODONE  (Oxy IR/ROXICODONE ) immediate release tablet 5 mg  5 mg Oral Q6H PRN Keturah Carrier, MD   5 mg at 04/19/24 0410   pantoprazole  (PROTONIX ) EC tablet 40 mg  40 mg Oral BID Segars, Jonathan, MD   40 mg at 04/19/24 1025   piperacillin -tazobactam (ZOSYN ) IVPB 3.375 g  3.375 g Intravenous Q8H Luz Skelton, MD 12.5 mL/hr at 04/19/24 1030 3.375 g at 04/19/24 1030   rifaximin  (XIFAXAN ) tablet 550 mg  550 mg Oral BID Keturah Carrier, MD   550 mg at 04/19/24 1025   sodium chloride  flush (NS) 0.9 % injection 3 mL  3 mL Intravenous Q12H Keturah Carrier, MD   3 mL at 04/19/24 0535   Allergies as of 04/18/2024 - Review Complete 04/18/2024  Allergen Reaction Noted   Celebrex [celecoxib] Other (See Comments) 12/08/2015   Shellfish allergy Swelling, Rash, and Other (See Comments) 08/02/2014   Barium-containing compounds Other (See Comments) 06/06/2017   Prednisone Other (See Comments) 12/08/2015   Statins Other (See Comments) 02/03/2011   Fentanyl  Other (See Comments) 04/26/2018   Ibuprofen  Other (See Comments) 05/02/2016   Sglt2 inhibitors  Other (See Comments) 03/07/2023   Tramadol  Other (See Comments) 09/07/2018   Tylenol  [acetaminophen ] Other (See Comments) 05/02/2016   Zetia [ezetimibe] Other (See Comments) 03/29/2021   Family History  Problem Relation Age of Onset   Heart failure Mother    Cancer Mother    Hyperlipidemia Mother    Congestive Heart Failure Mother    Stroke Mother    Lung cancer Father    Cancer Father    Cancer Brother    Hyperlipidemia Brother    Stroke Other    Breast cancer Neg Hx    Social History   Socioeconomic History   Marital status: Married    Spouse name: Not on file   Number of children: Not on file   Years of education: Not on file   Highest education level: Not on file  Occupational History   Occupation: Scientist, product/process development  Tobacco Use   Smoking status: Former    Current packs/day: 0.00    Average packs/day: 1 pack/day for 3.0 years (3.0 ttl pk-yrs)    Types: Cigarettes    Start date: 09/20/1971    Quit date: 09/19/1974    Years since quitting: 49.6   Smokeless tobacco: Never  Vaping Use   Vaping status: Never Used  Substance and Sexual Activity   Alcohol  use: Not Currently    Comment: occ   Drug use: No   Sexual activity: Not on file  Other Topics Concern   Not on  file  Social History Narrative   Lives in [place] with her husband.  Instructor for early childhood development.     Social Drivers of Corporate investment banker Strain: Not on file  Food Insecurity: No Food Insecurity (04/19/2024)   Hunger Vital Sign    Worried About Running Out of Food in the Last Year: Never true    Ran Out of Food in the Last Year: Never true  Transportation Needs: No Transportation Needs (04/19/2024)   PRAPARE - Administrator, Civil Service (Medical): No    Lack of Transportation (Non-Medical): No  Physical Activity: Not on file  Stress: Not on file  Social Connections: Socially Integrated (04/19/2024)   Social Connection and Isolation Panel    Frequency of  Communication with Friends and Family: More than three times a week    Frequency of Social Gatherings with Friends and Family: More than three times a week    Attends Religious Services: 1 to 4 times per year    Active Member of [employer] or Organizations: No    Attends Engineer, structural: 1 to 4 times per year    Marital Status: Married  Catering manager Violence: Not At Risk (04/19/2024)   Humiliation, Afraid, Rape, and Kick questionnaire    Fear of Current or Ex-Partner: No    Emotionally Abused: No    Physically Abused: No    Sexually Abused: No   Review of Systems:  Review of Systems  Respiratory:  Positive for shortness of breath.   Cardiovascular:  Negative for chest pain.  Gastrointestinal:  Positive for abdominal pain. Negative for blood in stool, nausea and vomiting.    OBJECTIVE:   Temp:  [97.9 F (36.6 C)-102.2 F (39 C)] 97.9 F (36.6 C) (08/01 0945) Pulse Rate:  [91-184] 103 (08/01 1013) Resp:  [9-25] 21 (08/01 1013) BP: (96-150)/(38-81) 150/78 (08/01 1013) SpO2:  [90 %-100 %] 100 % (08/01 0605) Weight:  [128.4 kg-135.5 kg] 135.5 kg (08/01 0350) Last BM Date : 04/17/24 Physical Exam Constitutional:      General: She is not in acute distress.    Appearance: She is not ill-appearing, toxic-appearing or diaphoretic.  Cardiovascular:     Rate and Rhythm: Normal rate.     Heart sounds: Murmur heard.  Pulmonary:     Effort: No respiratory distress.     Breath sounds: Normal breath sounds.  Abdominal:     General: Bowel sounds are normal. There is no distension.     Palpations: Abdomen is soft.     Tenderness: There is no abdominal tenderness. There is no guarding.  Musculoskeletal:     Right lower leg: Edema (Pedal) present.     Left lower leg: Edema (Pedal) present.  Skin:    General: Skin is warm and dry.  Neurological:     Mental Status: She is alert.     Labs: Recent Labs    04/18/24 1908 04/19/24 0317  WBC 4.2 7.1  HGB 5.9* 6.0*  HCT  18.7* 19.9*  PLT 26* 30*   BMET Recent Labs    04/18/24 1908 04/19/24 0317  NA 131* 131*  K 4.3 4.4  CL 98 102  CO2 21* 23  GLUCOSE 275* 250*  BUN 40* 46*  CREATININE 2.21* 2.14*  CALCIUM  8.9 8.6*   LFT Recent Labs    04/19/24 0317  PROT 6.4*  ALBUMIN  2.9*  AST 27  ALT 19  ALKPHOS 69  BILITOT 3.4*  BILIDIR 0.7*  IBILI 2.7*   PT/INR Recent Labs    04/18/24 1909 04/19/24 0317  LABPROT 17.4* 17.7*  INR 1.4* 1.4*    Diagnostic imaging: DG Chest 1 View Result Date: 04/18/2024 CLINICAL DATA:  Shortness of breath EXAM: CHEST  1 VIEW COMPARISON:  Chest radiograph dated 02/05/2024 FINDINGS: Normal lung volumes. Chronic-appearing pulmonary vascular congestion. Patchy left retrocardiac opacity. Unchanged blunting of the left costophrenic angle. No pneumothorax. Similar enlarged cardiomediastinal silhouette. No acute osseous abnormality. IMPRESSION: 1. Patchy left retrocardiac opacity, which may represent atelectasis, aspiration, or pneumonia. 2. Chronic-appearing pulmonary vascular congestion. 3. Unchanged blunting of the left costophrenic angle, which may represent a small pleural effusion. Electronically Signed   By: Limin  Xu M.D.   On: 04/18/2024 18:51   US  Venous Img Lower  Left (DVT Study) Result Date: 04/18/2024 CLINICAL DATA:  Leg pain with medial thigh redness. EXAM: LEFT LOWER EXTREMITY VENOUS DOPPLER ULTRASOUND TECHNIQUE: Gray-scale sonography with compression, as well as color and duplex ultrasound, were performed to evaluate the deep venous system(s) from the level of the common femoral vein through the popliteal and proximal calf veins. COMPARISON:  None Available. FINDINGS: VENOUS There is limited evaluation of the bilateral common femoral veins and left saphenofemoral junction. The posterior tibial and peroneal veins are not well visualized in the left lower extremity. Normal compressibility of the common femoral, superficial femoral, and popliteal veins. Visualized  portions of profunda femoral vein and great saphenous vein unremarkable. No filling defects to suggest DVT on grayscale or color Doppler imaging. Doppler waveforms show normal direction of venous flow, normal respiratory plasticity and response to augmentation. Limited views of the contralateral common femoral vein are unremarkable. OTHER None. Limitations: none IMPRESSION: No evidence of deep venous thrombosis in the left lower extremity. Electronically Signed   By: Greig Pique M.D.   On: 04/18/2024 17:49   IMPRESSION: Metabolic associated cirrhosis decompensated by esophageal and gastric varix  - History of hepatic encephalopathy  - Status post BRTO October 2024  - TIPS attempt October 2024 unsuccessful Acute on chronic anemia, normocytic, no signs of active GI blood loss Thrombocytopenia Left lower extremity cellulitis Paroxysmal atrial fibrillation with rapid ventricular response, no anticoagulation Congestive heart failure Aortic stenosis   PLAN: - Would benefit from EGD in the future to survey esophageal varices and history of gastric varix, though given no signs of active GI blood loss would hold off for now, can complete this in the outpatient setting with Dr. Burnette - Continue diuretic therapy, lactulose  and rifaximin  - Trend H/H, transfuse for hemoglobin less than 7 - Monitor platelet count - Monitor bowel movements - [facility] will be available as needed   LOS: 0 days   Hannah Keas, DO [facility] Gastroenterology

## 2024-04-19 NOTE — Progress Notes (Signed)
 PT Cancellation Note  Patient Details Name: Hannah Khan MRN: [id number] DOB: 05-May-1956   Cancelled Treatment:    Reason Eval/Treat Not Completed: Medical issues which prohibited therapy (per RN pt is receiving 2nd unit of blood and is too fatigued to attempt mobility today, RN requested PT hold until tomorrow. Will follow.)   Sylvan Delon Copp PT 04/19/2024  Acute Rehabilitation Services  Office [phone]

## 2024-04-19 NOTE — Progress Notes (Signed)
   04/19/24 2021  BiPAP/CPAP/SIPAP  BiPAP/CPAP/SIPAP Pt Type Adult  BiPAP/CPAP/SIPAP Resmed (from home)  Mask Type Nasal mask (from home)  Dentures removed? Not applicable  FiO2 (%) 21 %  Heater Temperature  (humidifier filled to full line with sterile water)  Patient Home Machine Yes  Safety Check Completed by RT for Home Unit Yes, no issues noted  Patient Home Mask Yes  Patient Home Tubing Yes  Auto Titrate Yes  Minimum cmH2O 12 cmH2O  Maximum cmH2O 20 cmH2O  Device Plugged into RED Power Outlet Yes  BiPAP/CPAP /SiPAP Vitals  Bilateral Breath Sounds Diminished

## 2024-04-19 NOTE — Progress Notes (Signed)
 PT Cancellation Note  Patient Details Name: SYVANNA CIOLINO MRN: [id number] DOB: [birth year]   Cancelled Treatment:    Reason Eval/Treat Not Completed: Medical issues which prohibited therapy (Hgb 6.0. Will follow.)   Sylvan Delon Copp PT 04/19/2024  Acute Rehabilitation Services  Office [phone]

## 2024-04-19 NOTE — ED Notes (Signed)
Report given to [facility] 

## 2024-04-19 NOTE — Progress Notes (Signed)
 PHARMACY - PHYSICIAN COMMUNICATION CRITICAL VALUE ALERT - BLOOD CULTURE IDENTIFICATION (BCID)  Hannah Khan is an 68 y.o. female who presented to [facility] on 04/18/2024 with a chief complaint of  Chief Complaint  Patient presents with   Leg Swelling     Assessment:  2/4 bottles ( the anaerobic bottle in each set) is showing Staph epi with MecA resistance.   Name of physician (or Provider) Contacted: Dr. Luz   Current antibiotics: cefepime  and azithromycin   Changes to prescribed antibiotics recommended:  No changes needed based on this culture   Results for orders placed or performed during the hospital encounter of 04/18/24  Blood Culture ID Panel (Reflexed) (Collected: 04/18/2024  4:46 PM)  Result Value Ref Range   Enterococcus faecalis NOT DETECTED NOT DETECTED   Enterococcus Faecium NOT DETECTED NOT DETECTED   Listeria monocytogenes NOT DETECTED NOT DETECTED   Staphylococcus species DETECTED (A) NOT DETECTED   Staphylococcus aureus (BCID) NOT DETECTED NOT DETECTED   Staphylococcus epidermidis DETECTED (A) NOT DETECTED   Staphylococcus lugdunensis NOT DETECTED NOT DETECTED   Streptococcus species NOT DETECTED NOT DETECTED   Streptococcus agalactiae NOT DETECTED NOT DETECTED   Streptococcus pneumoniae NOT DETECTED NOT DETECTED   Streptococcus pyogenes NOT DETECTED NOT DETECTED   A.calcoaceticus-baumannii NOT DETECTED NOT DETECTED   Bacteroides fragilis NOT DETECTED NOT DETECTED   Enterobacterales NOT DETECTED NOT DETECTED   Enterobacter cloacae complex NOT DETECTED NOT DETECTED   Escherichia coli NOT DETECTED NOT DETECTED   Klebsiella aerogenes NOT DETECTED NOT DETECTED   Klebsiella oxytoca NOT DETECTED NOT DETECTED   Klebsiella pneumoniae NOT DETECTED NOT DETECTED   Proteus species NOT DETECTED NOT DETECTED   Salmonella species NOT DETECTED NOT DETECTED   Serratia marcescens NOT DETECTED NOT DETECTED   Haemophilus influenzae NOT DETECTED NOT DETECTED   Neisseria  meningitidis NOT DETECTED NOT DETECTED   Pseudomonas aeruginosa NOT DETECTED NOT DETECTED   Stenotrophomonas maltophilia NOT DETECTED NOT DETECTED   Candida albicans NOT DETECTED NOT DETECTED   Candida auris NOT DETECTED NOT DETECTED   Candida glabrata NOT DETECTED NOT DETECTED   Candida krusei NOT DETECTED NOT DETECTED   Candida parapsilosis NOT DETECTED NOT DETECTED   Candida tropicalis NOT DETECTED NOT DETECTED   Cryptococcus neoformans/gattii NOT DETECTED NOT DETECTED   Methicillin resistance mecA/C DETECTED (A) NOT DETECTED    Dolphus Roller, PharmD, BCPS 04/19/2024 6:27 PM

## 2024-04-20 DIAGNOSIS — R7881 Bacteremia: Secondary | ICD-10-CM | POA: Diagnosis not present

## 2024-04-20 DIAGNOSIS — J189 Pneumonia, unspecified organism: Secondary | ICD-10-CM

## 2024-04-20 DIAGNOSIS — R6520 Severe sepsis without septic shock: Secondary | ICD-10-CM

## 2024-04-20 DIAGNOSIS — B957 Other staphylococcus as the cause of diseases classified elsewhere: Secondary | ICD-10-CM

## 2024-04-20 LAB — COMPREHENSIVE METABOLIC PANEL WITH GFR
ALT: 19 U/L (ref 0–44)
AST: 26 U/L (ref 15–41)
Albumin: 2.7 g/dL — ABNORMAL LOW (ref 3.5–5.0)
Alkaline Phosphatase: 63 U/L (ref 38–126)
Anion gap: 9 (ref 5–15)
BUN: 52 mg/dL — ABNORMAL HIGH (ref 8–23)
CO2: 20 mmol/L — ABNORMAL LOW (ref 22–32)
Calcium: 8.3 mg/dL — ABNORMAL LOW (ref 8.9–10.3)
Chloride: 99 mmol/L (ref 98–111)
Creatinine, Ser: 2.35 mg/dL — ABNORMAL HIGH (ref 0.44–1.00)
GFR, Estimated: 22 mL/min — ABNORMAL LOW (ref >60)
Glucose, Bld: 229 mg/dL — ABNORMAL HIGH (ref 70–99)
Potassium: 4.1 mmol/L (ref 3.5–5.1)
Sodium: 128 mmol/L — ABNORMAL LOW (ref 135–145)
Total Bilirubin: 3.3 mg/dL — ABNORMAL HIGH (ref 0.0–1.2)
Total Protein: 6.3 g/dL — ABNORMAL LOW (ref 6.5–8.1)

## 2024-04-20 LAB — GLUCOSE, CAPILLARY
Glucose-Capillary: 190 mg/dL — ABNORMAL HIGH (ref 70–99)
Glucose-Capillary: 234 mg/dL — ABNORMAL HIGH (ref 70–99)
Glucose-Capillary: 240 mg/dL — ABNORMAL HIGH (ref 70–99)
Glucose-Capillary: 218 mg/dL — ABNORMAL HIGH (ref 70–99)
Glucose-Capillary: 219 mg/dL — ABNORMAL HIGH (ref 70–99)

## 2024-04-20 LAB — AMMONIA: Ammonia: 103 umol/L — ABNORMAL HIGH (ref 9–35)

## 2024-04-20 LAB — PHOSPHORUS: Phosphorus: 3.5 mg/dL (ref 2.5–4.6)

## 2024-04-20 LAB — CBC
HCT: 23.1 % — ABNORMAL LOW (ref 36.0–46.0)
Hemoglobin: 7.1 g/dL — ABNORMAL LOW (ref 12.0–15.0)
MCH: 27.5 pg (ref 26.0–34.0)
MCHC: 30.7 g/dL (ref 30.0–36.0)
MCV: 89.5 fL (ref 80.0–100.0)
Platelets: 30 K/uL — ABNORMAL LOW (ref 150–400)
RBC: 2.58 MIL/uL — ABNORMAL LOW (ref 3.87–5.11)
RDW: 17.6 % — ABNORMAL HIGH (ref 11.5–15.5)
WBC: 4.5 K/uL (ref 4.0–10.5)
nRBC: 0 % (ref 0.0–0.2)

## 2024-04-20 LAB — MAGNESIUM: Magnesium: 2.1 mg/dL (ref 1.7–2.4)

## 2024-04-20 LAB — TSH: TSH: 0.817 u[IU]/mL (ref 0.350–4.500)

## 2024-04-20 LAB — HAPTOGLOBIN: Haptoglobin: 37 mg/dL (ref 37–355)

## 2024-04-20 LAB — C-REACTIVE PROTEIN: CRP: 7.8 mg/dL — ABNORMAL HIGH (ref <1.0)

## 2024-04-20 MED ORDER — INSULIN ASPART 100 UNIT/ML IJ SOLN
3.0000 [IU] | Freq: Three times a day (TID) | INTRAMUSCULAR | Status: DC
  Administered 2024-04-20 – 2024-04-24 (×12): 3 [IU] via SUBCUTANEOUS

## 2024-04-20 MED ORDER — DAPTOMYCIN-SODIUM CHLORIDE 700-0.9 MG/100ML-% IV SOLN
8.0000 mg/kg | Freq: Every day | INTRAVENOUS | Status: DC
  Administered 2024-04-20 – 2024-04-23 (×4): 700 mg via INTRAVENOUS
  Filled 2024-04-20 (×4): qty 100

## 2024-04-20 MED ORDER — DOXYCYCLINE HYCLATE 100 MG PO TABS
100.0000 mg | ORAL_TABLET | Freq: Two times a day (BID) | ORAL | Status: DC
  Administered 2024-04-20 – 2024-04-23 (×8): 100 mg via ORAL
  Filled 2024-04-20 (×8): qty 1

## 2024-04-20 MED ORDER — INSULIN ASPART 100 UNIT/ML IJ SOLN
0.0000 [IU] | Freq: Three times a day (TID) | INTRAMUSCULAR | Status: DC
  Administered 2024-04-20: 5 [IU] via SUBCUTANEOUS
  Administered 2024-04-21: 3 [IU] via SUBCUTANEOUS
  Administered 2024-04-21 (×2): 5 [IU] via SUBCUTANEOUS
  Administered 2024-04-22 (×3): 3 [IU] via SUBCUTANEOUS
  Administered 2024-04-23 (×2): 2 [IU] via SUBCUTANEOUS
  Administered 2024-04-23: 3 [IU] via SUBCUTANEOUS
  Administered 2024-04-24: 5 [IU] via SUBCUTANEOUS

## 2024-04-20 MED ORDER — INSULIN ASPART 100 UNIT/ML IJ SOLN
0.0000 [IU] | Freq: Every day | INTRAMUSCULAR | Status: DC
  Administered 2024-04-20 – 2024-04-23 (×3): 2 [IU] via SUBCUTANEOUS

## 2024-04-20 MED ORDER — METOPROLOL TARTRATE 50 MG PO TABS
50.0000 mg | ORAL_TABLET | Freq: Two times a day (BID) | ORAL | Status: DC
  Administered 2024-04-20 – 2024-04-24 (×8): 50 mg via ORAL
  Filled 2024-04-20 (×8): qty 1

## 2024-04-20 NOTE — Progress Notes (Addendum)
 PROGRESS NOTE  Hannah Khan FMW:[id number] DOB: November 08, 1955   PCP: Regino Slater, MD  Patient is from: Home.  DOA: 04/18/2024 LOS: 1  Chief complaints Chief Complaint  Patient presents with   Leg Swelling     Brief Narrative / Interim history: 68 year old F with PMH of [place] cirrhosis, EV bleed s/p BRTO, failed TIPS, HFpEF, A-fib not on anticoagulation, CKD-3B, DM-2, anemia, thrombocytopenia, morbid obesity and OSA on CPAP sent to ED from PCP office after she presented there with left thigh rash, lightheadedness, dizziness and low blood pressure, and admitted with sepsis in the setting of left thigh cellulitis, possible left lung pneumonia and A-fib with RVR.  LLE venous Doppler negative for DVT.  Patient was started on Cardizem  drip for A-fib and broad-spectrum antibiotics for sepsis/cellulitis.   Blood cultures with Staph epidermidis in 4 out of 4 bottles.  ID consulted and recommended adding daptomycin  and doxycycline  pending culture sensitivity.   Subjective: Seen and examined earlier this morning.  No major events overnight or this morning.  Reports feeling better.  Reports improvement in cellulitis. Denies chest pain, shortness of breath or cough.  Denies melena or hematochezia.  Just moved out of bed to bedside chair.  HR in the 110s.  Objective: Vitals:   04/20/24 1003 04/20/24 1040 04/20/24 1200 04/20/24 1204  BP: (!) 147/72   (!) 157/68  Pulse: (!) 104   (!) 109  Resp:    16  Temp:   98 F (36.7 C)   TempSrc:   Oral   SpO2:  100%  93%  Weight:      Height:        Examination:  GENERAL: No apparent distress.  Nontoxic. HEENT: MMM.  Vision and hearing grossly intact.  NECK: Supple.  No apparent JVD.  RESP:  No IWOB.  Fair aeration bilaterally. CVS: HR in 110s.  Regular.  3/6 SEM over RUSB and LUSB. ABD/GI/GU: BS+. Abd soft, NTND.  MSK/EXT:  Moves extremities.  Mild erythema over medial aspect of left thigh.  Seems to be receding.  Trace BLE edema. SKIN: no  apparent skin lesion or wound NEURO: AA.  Oriented appropriately.  No apparent focal neuro deficit. PSYCH: Calm. Normal affect.   Consultants:  Infectious disease  Procedures: None  Microbiology summarized: COVID-19, influenza and RSV PCR nonreactive MRSA PCR screen negative. Blood cultures with staph epidermis in 4 out of 4 bottles  Assessment and plan: Severe sepsis due to LLE cellulitis and possible left lung pneumonia: Present on admission.  Febrile, tachycardic and tachypneic on presentation.  Lactic acid 2.6.  CXR with retrocardiac opacity but patient without respiratory symptoms.  Blood culture with staph epidermis in 4 out of 4 bottles.  MRSA PCR screen negative.  LLE venous Doppler negative for DVT.  Sepsis physiology resolving. -Continue IV cefepime  -Discontinue Zithromax .  Start daptomycin  and doxycycline  for possible bacteremia and pneumonia - Repeat blood culture on 8/3 - IV cefepime  2 g q12   Persistent A-fib with RVR: RVR improved.  Transitioned to p.o. metoprolol  and digoxin .  TSH normal..  -Increase metoprolol  to 50 mg twice daily -Continue digoxin  -Not on anticoagulation due to GI bleed -Optimize electrolytes  Acute on chronic blood loss anemia: denies overt bleeding.  H&H seems to be stable after 2 units Recent Labs    02/03/24 0812 02/04/24 1115 02/05/24 0204 02/06/24 0240 02/07/24 0212 04/18/24 1908 04/19/24 0317 04/19/24 1517 04/19/24 1518 04/20/24 0305  HGB 7.3* 7.3* 7.4* 7.3* 7.2* 5.9* 6.0* 7.8* 7.8* 7.1*  -  Continue monitoring. - Continue Protonix  - Appreciate input by GI-outpatient EGD  Hollie cirrhosis: Appears compensated.  Ammonia level elevated to 103 but no encephalopathy. -Continue lactulose  and rifaximin . -Holding diuretics due to renal function   AKI on CKD-3B: b/l Cr ~1.7-2.0.  AKI likely due to A-fib, vancomycin  and sepsis. Recent Labs    12/13/23 1216 02/02/24 0900 02/03/24 0812 02/04/24 0227 02/05/24 0204 02/06/24 0240  02/07/24 0212 04/18/24 1908 04/19/24 0317 04/20/24 0305  BUN 21 37* 34* 32* 27* 29* 29* 40* 46* 52*  CREATININE 1.64* 2.05* 2.11* 1.98* 1.93* 1.81* 1.75* 2.21* 2.14* 2.35*  - Continue holding diuretics - Avoid nephrotoxic meds - Recheck in the morning  IDDM-2 with hyperglycemia: A1c 6.0%. Recent Labs  Lab 04/19/24 0757 04/19/24 1138 04/19/24 1641 04/20/24 0753 04/20/24 1145  GLUCAP 261* 265* 241* 190* 234*  - Continue Semglee  24 units daily -Increase SSI to moderate -Add NovoLog  3 units 3 times daily with meals  OSA on CPAP - Continue CPAP  Hyponatremia: Dilutional? - Continue monitoring  Thrombocytopenia: Likely due to liver cirrhosis. - Continue monitoring  Morbid obesity Body mass index is 46.79 kg/m. - Encourage lifestyle change to lose weight          DVT prophylaxis:  SCDs Start: 04/19/24 0212  Code Status: Full code Family Communication: None at bedside Level of care: Progressive Status is: Inpatient Remains inpatient appropriate because: Severe sepsis, possible bacteremia, pneumonia, A-fib with RVR and AKI   Final disposition: Home   55 minutes with more than 50% spent in reviewing records, counseling patient/family and coordinating care.   Sch Meds:  Scheduled Meds:  sodium chloride    Intravenous Once   Chlorhexidine  Gluconate Cloth  6 each Topical QHS   digoxin   125 mcg Oral Daily   doxycycline   100 mg Oral Q12H   insulin  aspart  0-9 Units Subcutaneous TID WC   insulin  glargine-yfgn  24 Units Subcutaneous Daily   lactulose   20 g Oral TID   metoprolol  tartrate  25 mg Oral BID   midodrine   10 mg Oral TID with meals   pantoprazole   40 mg Oral BID   rifaximin   550 mg Oral BID   sodium chloride  flush  3 mL Intravenous Q12H   Continuous Infusions:  ceFEPime  (MAXIPIME ) IV Stopped (04/20/24 1102)   DAPTOmycin  Stopped (04/20/24 1124)   famotidine  (PEPCID ) IV Stopped (04/20/24 1158)   PRN Meds:.albuterol , diphenhydrAMINE , hydrOXYzine ,  lidocaine , melatonin, methocarbamol , ondansetron  (ZOFRAN ) IV, mouth rinse, oxyCODONE  **OR** oxyCODONE   Antimicrobials: Anti-infectives (From admission, onward)    Start     Dose/Rate Route Frequency Ordered Stop   04/21/24 0300  vancomycin  (VANCOREADY) IVPB 1500 mg/300 mL  Status:  Discontinued        1,500 mg 150 mL/hr over 120 Minutes Intravenous Every 48 hours 04/19/24 0229 04/19/24 0941   04/20/24 1000  doxycycline  (VIBRA -TABS) tablet 100 mg        100 mg Oral Every 12 hours 04/20/24 0857     04/20/24 1000  DAPTOmycin  (CUBICIN ) IVPB 700 mg/146mL premix        8 mg/kg  91.2 kg (Adjusted) 200 mL/hr over 30 Minutes Intravenous Daily 04/20/24 0859     04/19/24 1800  cefTRIAXone  (ROCEPHIN ) 2 g in sodium chloride  0.9 % 100 mL IVPB  Status:  Discontinued        2 g 200 mL/hr over 30 Minutes Intravenous Every 24 hours 04/19/24 0215 04/19/24 0942   04/19/24 1500  ceFEPIme  (MAXIPIME ) 2 g in sodium chloride  0.9 %  100 mL IVPB        2 g 200 mL/hr over 30 Minutes Intravenous Every 12 hours 04/19/24 1450     04/19/24 1000  rifaximin  (XIFAXAN ) tablet 550 mg        550 mg Oral 2 times daily 04/19/24 0218     04/19/24 1000  piperacillin -tazobactam (ZOSYN ) IVPB 3.375 g  Status:  Discontinued        3.375 g 12.5 mL/hr over 240 Minutes Intravenous Every 8 hours 04/19/24 0942 04/19/24 1449   04/19/24 0315  vancomycin  (VANCOREADY) IVPB 2000 mg/400 mL        2,000 mg 200 mL/hr over 120 Minutes Intravenous  Once 04/19/24 0227 04/19/24 0502   04/19/24 0214  azithromycin  (ZITHROMAX ) tablet 500 mg  Status:  Discontinued        500 mg Oral Daily at bedtime 04/19/24 0215 04/20/24 0857   04/18/24 1900  azithromycin  (ZITHROMAX ) 500 mg in sodium chloride  0.9 % 250 mL IVPB        500 mg 250 mL/hr over 60 Minutes Intravenous  Once 04/18/24 1856 04/18/24 2042   04/18/24 1815  cefTRIAXone  (ROCEPHIN ) 2 g in sodium chloride  0.9 % 100 mL IVPB        2 g 200 mL/hr over 30 Minutes Intravenous  Once 04/18/24 1802  04/18/24 1919        I have personally reviewed the following labs and images: CBC: Recent Labs  Lab 04/18/24 1908 04/19/24 0317 04/19/24 1517 04/19/24 1518 04/20/24 0305  WBC 4.2 7.1 6.0  --  4.5  NEUTROABS 3.7 6.7  --   --   --   HGB 5.9* 6.0* 7.8* 7.8* 7.1*  HCT 18.7* 19.9* 24.6* 25.2* 23.1*  MCV 87.4 92.1 89.1  --  89.5  PLT 26* 30* 36*  --  30*   BMP &GFR Recent Labs  Lab 04/18/24 1908 04/19/24 0317 04/20/24 0305  NA 131* 131* 128*  K 4.3 4.4 4.1  CL 98 102 99  CO2 21* 23 20*  GLUCOSE 275* 250* 229*  BUN 40* 46* 52*  CREATININE 2.21* 2.14* 2.35*  CALCIUM  8.9 8.6* 8.3*  MG  --  2.0 2.1  PHOS  --  3.5 3.5   Estimated Creatinine Clearance: 33 mL/min (A) (by C-G formula based on SCr of 2.35 mg/dL (H)). Liver & Pancreas: Recent Labs  Lab 04/18/24 1908 04/19/24 0317 04/20/24 0305  AST 30 27 26   ALT 18 19 19   ALKPHOS 82 69 63  BILITOT 2.7* 3.4* 3.3*  PROT 6.1* 6.4* 6.3*  ALBUMIN  3.3* 2.9* 2.7*   No results for input(s): LIPASE, AMYLASE in the last 168 hours. Recent Labs  Lab 04/20/24 0305  AMMONIA 103*   Diabetic: No results for input(s): HGBA1C in the last 72 hours. Recent Labs  Lab 04/19/24 0757 04/19/24 1138 04/19/24 1641 04/20/24 0753 04/20/24 1145  GLUCAP 261* 265* 241* 190* 234*   Cardiac Enzymes: No results for input(s): CKTOTAL, CKMB, CKMBINDEX, TROPONINI in the last 168 hours. Recent Labs    02/02/24 0900  PROBNP 1,655.0*   Coagulation Profile: Recent Labs  Lab 04/18/24 1909 04/19/24 0317  INR 1.4* 1.4*   Thyroid  Function Tests: Recent Labs    04/20/24 0305  TSH 0.817   Lipid Profile: No results for input(s): CHOL, HDL, LDLCALC, TRIG, CHOLHDL, LDLDIRECT in the last 72 hours. Anemia Panel: Recent Labs    04/19/24 0317  VITAMINB12 445  FOLATE 12.0  FERRITIN 31  TIBC 340  IRON 67  RETICCTPCT 5.0*   Urine analysis:    Component Value Date/Time   COLORURINE YELLOW 04/19/2024 0211    APPEARANCEUR HAZY (A) 04/19/2024 0211   LABSPEC 1.013 04/19/2024 0211   PHURINE 5.0 04/19/2024 0211   GLUCOSEU NEGATIVE 04/19/2024 0211   HGBUR NEGATIVE 04/19/2024 0211   BILIRUBINUR NEGATIVE 04/19/2024 0211   KETONESUR NEGATIVE 04/19/2024 0211   PROTEINUR NEGATIVE 04/19/2024 0211   UROBILINOGEN 2.0 (H) 01/31/2015 0055   NITRITE NEGATIVE 04/19/2024 0211   LEUKOCYTESUR NEGATIVE 04/19/2024 0211   Sepsis Labs: Invalid input(s): PROCALCITONIN, LACTICIDVEN  Microbiology: Recent Results (from the past 240 hours)  Culture, blood (routine x 2)     Status: Abnormal (Preliminary result)   Collection Time: 04/18/24  4:46 PM   Specimen: BLOOD RIGHT ARM  Result Value Ref Range Status   Specimen Description   Final    BLOOD RIGHT ARM Performed at [facility], [address], [place], [place] [address]    Special Requests   Final    BOTTLES DRAWN AEROBIC AND ANAEROBIC Blood Culture adequate volume Performed at [facility], [address] [place], [place] [address]    Culture  Setup Time   Final    GRAM POSITIVE COCCI IN CLUSTERS IN BOTH AEROBIC AND ANAEROBIC BOTTLES CRITICAL RESULT CALLED TO, READ BACK BY AND VERIFIED WITH: PHARMD N. GLOGOVAC [id number] @ 1650 FH    Culture (A)  Final    STAPHYLOCOCCUS EPIDERMIDIS SUSCEPTIBILITIES TO FOLLOW Performed at [facility], [address], [place], [place] [address]    Report Status PENDING  Incomplete  Blood Culture ID Panel (Reflexed)     Status: Abnormal   Collection Time: 04/18/24  4:46 PM  Result Value Ref Range Status   Enterococcus faecalis NOT DETECTED NOT DETECTED Final   Enterococcus Faecium NOT DETECTED NOT DETECTED Final   Listeria monocytogenes NOT DETECTED NOT DETECTED Final   Staphylococcus species DETECTED (A) NOT DETECTED Final    Comment: CRITICAL RESULT CALLED TO, READ BACK BY AND VERIFIED WITH: PHARMD N. GLOGOVAC [id number] @ 1650 FH    Staphylococcus aureus (BCID) NOT DETECTED NOT DETECTED Final    Staphylococcus epidermidis DETECTED (A) NOT DETECTED Final    Comment: Methicillin (oxacillin) resistant coagulase negative staphylococcus. Possible blood culture contaminant (unless isolated from more than one blood culture draw or clinical case suggests pathogenicity). No antibiotic treatment is indicated for blood  culture contaminants. CRITICAL RESULT CALLED TO, READ BACK BY AND VERIFIED WITH: PHARMD N. GLOGOVAC [id number] @ 1650 FH    Staphylococcus lugdunensis NOT DETECTED NOT DETECTED Final   Streptococcus species NOT DETECTED NOT DETECTED Final   Streptococcus agalactiae NOT DETECTED NOT DETECTED Final   Streptococcus pneumoniae NOT DETECTED NOT DETECTED Final   Streptococcus pyogenes NOT DETECTED NOT DETECTED Final   A.calcoaceticus-baumannii NOT DETECTED NOT DETECTED Final   Bacteroides fragilis NOT DETECTED NOT DETECTED Final   Enterobacterales NOT DETECTED NOT DETECTED Final   Enterobacter cloacae complex NOT DETECTED NOT DETECTED Final   Escherichia coli NOT DETECTED NOT DETECTED Final   Klebsiella aerogenes NOT DETECTED NOT DETECTED Final   Klebsiella oxytoca NOT DETECTED NOT DETECTED Final   Klebsiella pneumoniae NOT DETECTED NOT DETECTED Final   Proteus species NOT DETECTED NOT DETECTED Final   Salmonella species NOT DETECTED NOT DETECTED Final   Serratia marcescens NOT DETECTED NOT DETECTED Final   Haemophilus influenzae NOT DETECTED NOT DETECTED Final   Neisseria meningitidis NOT DETECTED NOT DETECTED Final   Pseudomonas aeruginosa  NOT DETECTED NOT DETECTED Final   Stenotrophomonas maltophilia NOT DETECTED NOT DETECTED Final   Candida albicans NOT DETECTED NOT DETECTED Final   Candida auris NOT DETECTED NOT DETECTED Final   Candida glabrata NOT DETECTED NOT DETECTED Final   Candida krusei NOT DETECTED NOT DETECTED Final   Candida parapsilosis NOT DETECTED NOT DETECTED Final   Candida tropicalis NOT DETECTED NOT DETECTED Final   Cryptococcus neoformans/gattii NOT  DETECTED NOT DETECTED Final   Methicillin resistance mecA/C DETECTED (A) NOT DETECTED Final    Comment: CRITICAL RESULT CALLED TO, READ BACK BY AND VERIFIED WITH: PHARMD N. GLOGOVAC [id number] @ 1650 FH Performed at [facility], [address] [place], [place] [address]   Culture, blood (routine x 2)     Status: None (Preliminary result)   Collection Time: 04/18/24  4:51 PM   Specimen: BLOOD LEFT ARM  Result Value Ref Range Status   Specimen Description   Final    BLOOD LEFT ARM Performed at [facility], [address] [place], [place] [address]    Special Requests   Final    BOTTLES DRAWN AEROBIC AND ANAEROBIC Blood Culture adequate volume Performed at [facility], [address] [place], [place] [address]    Culture  Setup Time   Final    GRAM POSITIVE COCCI IN CLUSTERS IN BOTH AEROBIC AND ANAEROBIC BOTTLES CRITICAL RESULT CALLED TO, READ BACK BY AND VERIFIED WITH: PHARMD N. GLOGOVAC [id number] @ 1650 FH    Culture   Final    GRAM POSITIVE COCCI IDENTIFICATION TO FOLLOW Performed at [facility], [address] [place], [place] [address]    Report Status PENDING  Incomplete  Resp panel by RT-PCR (RSV, Flu A&B, Covid) Anterior Nasal Swab     Status: None   Collection Time: 04/19/24  1:22 AM   Specimen: Anterior Nasal Swab  Result Value Ref Range Status   SARS Coronavirus 2 by RT PCR NEGATIVE NEGATIVE Final    Comment: (NOTE) SARS-CoV-2 target nucleic acids are NOT DETECTED.  The SARS-CoV-2 RNA is generally detectable in upper respiratory specimens during the acute phase of infection. The lowest concentration of SARS-CoV-2 viral copies this assay can detect is 138 copies/mL. A negative result does not preclude SARS-Cov-2 infection and should not be used as the sole basis for treatment or other patient management decisions. A negative result may occur with  improper specimen collection/handling, submission of specimen other than nasopharyngeal  swab, presence of viral mutation(s) within the areas targeted by this assay, and inadequate number of viral copies(<138 copies/mL). A negative result must be combined with clinical observations, patient history, and epidemiological information. The expected result is Negative.  Fact Sheet for Patients:  BloggerCourse.com  Fact Sheet for Healthcare Providers:  SeriousBroker.it  This test is no t yet approved or cleared by the [place]  FDA and  has been authorized for detection and/or diagnosis of SARS-CoV-2 by FDA under an Emergency Use Authorization (EUA). This EUA will remain  in effect (meaning this test can be used) for the duration of the COVID-19 declaration under Section 564(b)(1) of the Act, 21 U.S.C.section 360bbb-3(b)(1), unless the authorization is terminated  or revoked sooner.       Influenza A by PCR NEGATIVE NEGATIVE Final   Influenza B by PCR NEGATIVE NEGATIVE Final    Comment: (NOTE) The Xpert Xpress SARS-CoV-2/FLU/RSV plus assay is intended as an aid in the diagnosis of influenza from Nasopharyngeal swab specimens  and should not be used as a sole basis for treatment. Nasal washings and aspirates are unacceptable for Xpert Xpress SARS-CoV-2/FLU/RSV testing.  Fact Sheet for Patients: BloggerCourse.com  Fact Sheet for Healthcare Providers: SeriousBroker.it  This test is not yet approved or cleared by the [place]  FDA and has been authorized for detection and/or diagnosis of SARS-CoV-2 by FDA under an Emergency Use Authorization (EUA). This EUA will remain in effect (meaning this test can be used) for the duration of the COVID-19 declaration under Section 564(b)(1) of the Act, 21 U.S.C. section 360bbb-3(b)(1), unless the authorization is terminated or revoked.     Resp Syncytial Virus by PCR NEGATIVE NEGATIVE Final    Comment: (NOTE) Fact Sheet for  Patients: BloggerCourse.com  Fact Sheet for Healthcare Providers: SeriousBroker.it  This test is not yet approved or cleared by the [place]  FDA and has been authorized for detection and/or diagnosis of SARS-CoV-2 by FDA under an Emergency Use Authorization (EUA). This EUA will remain in effect (meaning this test can be used) for the duration of the COVID-19 declaration under Section 564(b)(1) of the Act, 21 U.S.C. section 360bbb-3(b)(1), unless the authorization is terminated or revoked.  Performed at [facility], [address], [place], [place] [address]   MRSA Next Gen by PCR, Nasal     Status: None   Collection Time: 04/19/24  1:22 AM   Specimen: Anterior Nasal Swab  Result Value Ref Range Status   MRSA by PCR Next Gen NOT DETECTED NOT DETECTED Final    Comment: (NOTE) The GeneXpert MRSA Assay (FDA approved for NASAL specimens only), is one component of a comprehensive MRSA colonization surveillance program. It is not intended to diagnose MRSA infection nor to guide or monitor treatment for MRSA infections. Test performance is not FDA approved in patients less than 46 years old. Performed at [facility], [address] [place], [place] [address]   Respiratory (~20 pathogens) panel by PCR     Status: None   Collection Time: 04/19/24  9:00 AM   Specimen: Nasopharyngeal Swab; Respiratory  Result Value Ref Range Status   Adenovirus NOT DETECTED NOT DETECTED Final   Coronavirus 229E NOT DETECTED NOT DETECTED Final    Comment: (NOTE) The Coronavirus on the Respiratory Panel, DOES NOT test for the novel  Coronavirus (2019 nCoV)    Coronavirus HKU1 NOT DETECTED NOT DETECTED Final   Coronavirus NL63 NOT DETECTED NOT DETECTED Final   Coronavirus OC43 NOT DETECTED NOT DETECTED Final   Metapneumovirus NOT DETECTED NOT DETECTED Final   Rhinovirus / Enterovirus NOT DETECTED NOT DETECTED  Final   Influenza A NOT DETECTED NOT DETECTED Final   Influenza B NOT DETECTED NOT DETECTED Final   Parainfluenza Virus 1 NOT DETECTED NOT DETECTED Final   Parainfluenza Virus 2 NOT DETECTED NOT DETECTED Final   Parainfluenza Virus 3 NOT DETECTED NOT DETECTED Final   Parainfluenza Virus 4 NOT DETECTED NOT DETECTED Final   Respiratory Syncytial Virus NOT DETECTED NOT DETECTED Final   Bordetella pertussis NOT DETECTED NOT DETECTED Final   Bordetella Parapertussis NOT DETECTED NOT DETECTED Final   Chlamydophila pneumoniae NOT DETECTED NOT DETECTED Final   Mycoplasma pneumoniae NOT DETECTED NOT DETECTED Final    Comment: Performed at [facility], [address], [place], [place] [address]    Radiology Studies: No results found.    Jacqueline Delapena T. Alma Mohiuddin [facility]  If 7PM-7AM, please contact night-coverage [email or web address] 04/20/2024, 12:44 PM

## 2024-04-20 NOTE — Evaluation (Signed)
 Physical Therapy Evaluation Patient Details Name: Hannah Khan MRN: [id number] DOB: [birth year] Today's Date: 04/20/2024  History of Present Illness  68 y.o. female who presented due to concern for left thigh rash to [facility] OP clinic and referred to ED for r/o DVT, now transferred from [facility] ED for further treatment of cellulitis, possible pneumonia, and A-fib RVR. Pt with hx of decompensated Nash cirrhosis, prior history of EV bleed , failed TIPS, chronic anemia and thrombocytopenia, A-fib not on anticoagulation, mild to moderate aortic stenosis, HFpEF, CKD stage IIIb, diabetes type 2, hypertension, morbid obesity, OSA on CPAP, fibromyalgia.  Clinical Impression  Pt admitted with above diagnosis.  Pt currently with functional limitations due to the deficits listed below (see PT Problem List). Pt will benefit from acute skilled PT to increase their independence and safety with mobility to allow discharge.     The patient   required contact guard assist for mobility,  stepped to recliner using a Rw. Patient reports that she has ambulated to BR, not up to ambulating farther distance at this time. HR 91-117, Spo2 on RA 100%.  Patient  with 2-3/4 dyspnea and noted wheezing with activity.  Patient is independent  at baseline, has RW as needed. Patient should progress to return home with family support. Recommend HHPT.        If plan is discharge home, recommend the following: A little help with walking and/or transfers;A little help with bathing/dressing/bathroom;Assistance with cooking/housework;Assist for transportation;Help with stairs or ramp for entrance   Can travel by private vehicle        Equipment Recommendations None recommended by PT  Recommendations for Other Services       Functional Status Assessment Patient has had a recent decline in their functional status and demonstrates the ability to make significant improvements in function in a reasonable and predictable amount of time.      Precautions / Restrictions Precautions Precautions: Fall Precaution/Restrictions Comments: monitor HR Restrictions Weight Bearing Restrictions Per Provider Order: No      Mobility  Bed Mobility Overal bed mobility: Needs Assistance Bed Mobility: Supine to Sit     Supine to sit: Supervision     General bed mobility comments: for safety, moved quickly, momentum    Transfers Overall transfer level: Needs assistance Equipment used: Rolling walker (2 wheels) Transfers: Sit to/from Stand Sit to Stand: Contact guard assist                Ambulation/Gait Ambulation/Gait assistance: Contact guard assist Gait Distance (Feet): 5 Feet Assistive device: Rolling walker (2 wheels) Gait Pattern/deviations: Step-to pattern, Antalgic       General Gait Details: steady with RW  Stairs            Wheelchair Mobility     Tilt Bed    Modified Rankin (Stroke Patients Only)       Balance Overall balance assessment: Mild deficits observed, not formally tested                                           Pertinent Vitals/Pain Pain Assessment Pain Assessment: Faces Faces Pain Scale: Hurts a little bit Pain Location: LLE Pain Descriptors / Indicators: Discomfort Pain Intervention(s): Limited activity within patient's tolerance, Monitored during session, Repositioned    Home Living Family/patient expects to be discharged to:: Private residence Living Arrangements: Spouse/significant other Available Help at Discharge: Family;Available 24  hours/day Type of Home: House Home Access: Stairs to enter Entrance Stairs-Rails: None Entrance Stairs-Number of Steps: 2   Home Layout: One level Home Equipment: Rollator (4 wheels);Tub bench;BSC/3in1;Hand held shower head;Adaptive equipment Additional Comments: CPAP at night and adjustable bed    Prior Function Prior Level of Function : Independent/Modified Independent;Driving             Mobility  Comments: rollator at all times, uses electric shopping cart in the community ADLs Comments: ind with ADL, manages own meds, does light meal prep, drives some     Extremity/Trunk Assessment   Upper Extremity Assessment Upper Extremity Assessment: Overall WFL for tasks assessed    Lower Extremity Assessment Lower Extremity Assessment: Generalized weakness;LLE deficits/detail LLE Deficits / Details: noted demarcated redness    Cervical / Trunk Assessment Cervical / Trunk Assessment: Normal  Communication   Communication Communication: No apparent difficulties    Cognition Arousal: Alert Behavior During Therapy: WFL for tasks assessed/performed   PT - Cognitive impairments: No apparent impairments                         Following commands: Intact       Cueing       General Comments      Exercises     Assessment/Plan    PT Assessment Patient needs continued PT services  PT Problem List Decreased strength;Decreased mobility;Decreased safety awareness;Decreased activity tolerance;Cardiopulmonary status limiting activity;Pain;Decreased knowledge of precautions       PT Treatment Interventions DME instruction;Therapeutic activities;Gait training;Functional mobility training;Therapeutic exercise;Patient/family education    PT Goals (Current goals can be found in the Care Plan section)  Acute Rehab PT Goals Patient Stated Goal: go home PT Goal Formulation: With patient Time For Goal Achievement: 05/04/24 Potential to Achieve Goals: Good    Frequency Min 2X/week     Co-evaluation               AM-PAC PT 6 Clicks Mobility  Outcome Measure Help needed turning from your back to your side while in a flat bed without using bedrails?: None Help needed moving from lying on your back to sitting on the side of a flat bed without using bedrails?: None Help needed moving to and from a bed to a chair (including a wheelchair)?: A Little Help needed  standing up from a chair using your arms (e.g., wheelchair or bedside chair)?: A Little Help needed to walk in hospital room?: A Little Help needed climbing 3-5 steps with a railing? : A Lot 6 Click Score: 19    End of Session Equipment Utilized During Treatment: (P) Gait belt Activity Tolerance: (P) Patient tolerated treatment well Patient left: in chair;with call bell/phone within reach Nurse Communication: Mobility status PT Visit Diagnosis: (P) Unsteadiness on feet (R26.81);Pain Pain - Right/Left: Left Pain - part of body: Leg    Time: 9141-9079 PT Time Calculation (min) (ACUTE ONLY): 22 min   Charges:   PT Evaluation $PT Eval Low Complexity: 1 Low   PT General Charges $$ ACUTE PT VISIT: 1 Visit         Darice Potters PT Acute Rehabilitation Services Office [phone]   Potters Darice Norris 04/20/2024, 10:54 AM

## 2024-04-20 NOTE — Progress Notes (Signed)
   04/20/24 2226  BiPAP/CPAP/SIPAP  BiPAP/CPAP/SIPAP Pt Type Adult  BiPAP/CPAP/SIPAP Resmed  Mask Type Nasal mask  Dentures removed? Not applicable  Respiratory Rate 18 breaths/min  FiO2 (%) 21 %  Patient Home Machine Yes  Safety Check Completed by RT for Home Unit Yes, no issues noted  Patient Home Mask Yes  Patient Home Tubing Yes  Auto Titrate Yes  Minimum cmH2O 12 cmH2O  Maximum cmH2O 20 cmH2O  Device Plugged into RED Power Outlet Yes  BiPAP/CPAP /SiPAP Vitals  Resp 14  SpO2 96 %  MEWS Score/Color  MEWS Score 0  MEWS Score Color Green

## 2024-04-21 ENCOUNTER — Inpatient Hospital Stay (HOSPITAL_COMMUNITY)

## 2024-04-21 DIAGNOSIS — B957 Other staphylococcus as the cause of diseases classified elsewhere: Secondary | ICD-10-CM | POA: Diagnosis not present

## 2024-04-21 DIAGNOSIS — R6520 Severe sepsis without septic shock: Secondary | ICD-10-CM | POA: Diagnosis not present

## 2024-04-21 DIAGNOSIS — N189 Chronic kidney disease, unspecified: Secondary | ICD-10-CM

## 2024-04-21 DIAGNOSIS — K7469 Other cirrhosis of liver: Secondary | ICD-10-CM

## 2024-04-21 DIAGNOSIS — G4733 Obstructive sleep apnea (adult) (pediatric): Secondary | ICD-10-CM

## 2024-04-21 DIAGNOSIS — R918 Other nonspecific abnormal finding of lung field: Secondary | ICD-10-CM | POA: Diagnosis not present

## 2024-04-21 DIAGNOSIS — D696 Thrombocytopenia, unspecified: Secondary | ICD-10-CM

## 2024-04-21 DIAGNOSIS — R7881 Bacteremia: Secondary | ICD-10-CM | POA: Diagnosis not present

## 2024-04-21 DIAGNOSIS — J189 Pneumonia, unspecified organism: Secondary | ICD-10-CM | POA: Diagnosis not present

## 2024-04-21 DIAGNOSIS — I4821 Permanent atrial fibrillation: Secondary | ICD-10-CM

## 2024-04-21 DIAGNOSIS — L539 Erythematous condition, unspecified: Secondary | ICD-10-CM | POA: Diagnosis not present

## 2024-04-21 DIAGNOSIS — K7581 Nonalcoholic steatohepatitis (NASH): Secondary | ICD-10-CM

## 2024-04-21 DIAGNOSIS — R0989 Other specified symptoms and signs involving the circulatory and respiratory systems: Secondary | ICD-10-CM | POA: Diagnosis not present

## 2024-04-21 DIAGNOSIS — I8510 Secondary esophageal varices without bleeding: Secondary | ICD-10-CM

## 2024-04-21 DIAGNOSIS — I4891 Unspecified atrial fibrillation: Secondary | ICD-10-CM

## 2024-04-21 DIAGNOSIS — J90 Pleural effusion, not elsewhere classified: Secondary | ICD-10-CM | POA: Diagnosis not present

## 2024-04-21 LAB — CBC
HCT: 22.8 % — ABNORMAL LOW (ref 36.0–46.0)
Hemoglobin: 7 g/dL — ABNORMAL LOW (ref 12.0–15.0)
MCH: 27.8 pg (ref 26.0–34.0)
MCHC: 30.7 g/dL (ref 30.0–36.0)
MCV: 90.5 fL (ref 80.0–100.0)
Platelets: 32 K/uL — ABNORMAL LOW (ref 150–400)
RBC: 2.52 MIL/uL — ABNORMAL LOW (ref 3.87–5.11)
RDW: 17.5 % — ABNORMAL HIGH (ref 11.5–15.5)
WBC: 3.8 K/uL — ABNORMAL LOW (ref 4.0–10.5)
nRBC: 0 % (ref 0.0–0.2)

## 2024-04-21 LAB — CULTURE, BLOOD (ROUTINE X 2)
Special Requests: ADEQUATE
Special Requests: ADEQUATE

## 2024-04-21 LAB — COMPREHENSIVE METABOLIC PANEL WITH GFR
ALT: 19 U/L (ref 0–44)
AST: 29 U/L (ref 15–41)
Albumin: 2.6 g/dL — ABNORMAL LOW (ref 3.5–5.0)
Alkaline Phosphatase: 62 U/L (ref 38–126)
Anion gap: 10 (ref 5–15)
BUN: 53 mg/dL — ABNORMAL HIGH (ref 8–23)
CO2: 22 mmol/L (ref 22–32)
Calcium: 8.8 mg/dL — ABNORMAL LOW (ref 8.9–10.3)
Chloride: 99 mmol/L (ref 98–111)
Creatinine, Ser: 2.02 mg/dL — ABNORMAL HIGH (ref 0.44–1.00)
GFR, Estimated: 26 mL/min — ABNORMAL LOW (ref >60)
Glucose, Bld: 192 mg/dL — ABNORMAL HIGH (ref 70–99)
Potassium: 4 mmol/L (ref 3.5–5.1)
Sodium: 131 mmol/L — ABNORMAL LOW (ref 135–145)
Total Bilirubin: 3.3 mg/dL — ABNORMAL HIGH (ref 0.0–1.2)
Total Protein: 6.1 g/dL — ABNORMAL LOW (ref 6.5–8.1)

## 2024-04-21 LAB — PROTIME-INR
INR: 1.3 — ABNORMAL HIGH (ref 0.8–1.2)
Prothrombin Time: 16.5 s — ABNORMAL HIGH (ref 11.4–15.2)

## 2024-04-21 LAB — MAGNESIUM: Magnesium: 2.5 mg/dL — ABNORMAL HIGH (ref 1.7–2.4)

## 2024-04-21 LAB — GLUCOSE, CAPILLARY
Glucose-Capillary: 209 mg/dL — ABNORMAL HIGH (ref 70–99)
Glucose-Capillary: 227 mg/dL — ABNORMAL HIGH (ref 70–99)
Glucose-Capillary: 176 mg/dL — ABNORMAL HIGH (ref 70–99)
Glucose-Capillary: 188 mg/dL — ABNORMAL HIGH (ref 70–99)

## 2024-04-21 LAB — CK: Total CK: 32 U/L — ABNORMAL LOW (ref 38–234)

## 2024-04-21 LAB — AMMONIA: Ammonia: 45 umol/L — ABNORMAL HIGH (ref 9–35)

## 2024-04-21 LAB — PREPARE RBC (CROSSMATCH)

## 2024-04-21 MED ORDER — SODIUM CHLORIDE 0.9% IV SOLUTION: Freq: Once | INTRAVENOUS | Status: AC

## 2024-04-21 NOTE — Consult Note (Signed)
 Date of Admission:  04/18/2024          Reason for Consult: Staphylococcus epidermidis bacteremia    Referring Provider: Mignon Bump, MD   Assessment:  Staphylococcus epidermidis bacteremia Questionable cellulitis Doubtful pneumonia NASH with cirrhosis and esophageal varices, TTPenia CKD Morbid obesity OSA Atrial fibrillation   Plan:  Continue daptomycin   Doxycycline  Repeat 2V CXR DC cefepime  Repeat blood cultures taken TTE but no TEE  Dr. Fayette is taking over the service tomorrow for the week  Principal Problem:   Staphylococcus epidermidis bacteremia Active Problems:   Obstructive sleep apnea   Essential hypertension   Aortic heart murmur   Permanent atrial fibrillation (HCC)   Thrombocytopenia (HCC)   Sepsis (HCC)   Type 2 diabetes mellitus (HCC)   Severe sepsis (HCC)   Scheduled Meds:  sodium chloride    Intravenous Once   Chlorhexidine  Gluconate Cloth  6 each Topical QHS   digoxin   125 mcg Oral Daily   doxycycline   100 mg Oral Q12H   insulin  aspart  0-15 Units Subcutaneous TID WC   insulin  aspart  0-5 Units Subcutaneous QHS   insulin  aspart  3 Units Subcutaneous TID WC   insulin  glargine-yfgn  24 Units Subcutaneous Daily   lactulose   20 g Oral TID   metoprolol  tartrate  50 mg Oral BID   midodrine   10 mg Oral TID with meals   pantoprazole   40 mg Oral BID   rifaximin   550 mg Oral BID   sodium chloride  flush  3 mL Intravenous Q12H   Continuous Infusions:  ceFEPime  (MAXIPIME ) IV 2 g (04/21/24 1120)   DAPTOmycin  700 mg (04/21/24 1613)   famotidine  (PEPCID ) IV 20 mg (04/21/24 0934)   PRN Meds:.albuterol , diphenhydrAMINE , hydrOXYzine , lidocaine , melatonin, methocarbamol , ondansetron  (ZOFRAN ) IV, mouth rinse, oxyCODONE  **OR** oxyCODONE   HPI: Hannah Khan is a 68 y.o. female with a past medical history significant for NASH with cirrhosis and esophageal varices failed TIPS heart failure atrial fibrillation not on anticoagulation  chronic kidney disease diabetes chronic thrombocytopenia morbid obesity who was sent to the ER by primary care after concern of erythema in her lower extremity and swelling.  There was concern for potential deep venous thrombosis.   She was febrile to 102.2 upon arrival in the ER.  Her Doppler however was negative.  The patient believes she had cellulitis and when seen in the ER blood cultures were taken.  Chest x-ray had shown a patchy left retrocardiac opacity which could be atelectasis or an infiltrate with some's pulmonary vascular congestion as well and small pleural effusion seen.  She was initially started on ceftriaxone  azithromycin  and then vancomycin  was added then she was on Zosyn  briefly before being on cefepime  azithromycin  and then cefepime .  The blood cultures taken admission grew Staphylococcus epidermidis in 2 of 2 sites and I recommended starting daptomycin  along with oral doxycycline .  I also had wanted to have sensitivities performed to isolate to make sure they were indeed true pathogens rather than potential contaminants at separate sites and sensitivities came back identically today.  On exam I have I am fairly underwhelmed for cellulitis looking at her legs though we will go back and look and see if there are other pictures from earlier during this admission I am also skeptical of this retrocardiac opacity representing a pneumonia that would explain her Staph epidermidis bacteremia Staph epidermidis bacteremia is not commonly something we ever see with cellulitis or pneumonia.  Of course there is  concern that she could potentially have endocarditis we will check a 2D echocardiogram and blood cultures have been repeated doing a TEE in this patient would have extreme risk with her esophageal varices thrombocytopenia and morbid obesity.  I think with regards to duration of therapy we are going to have to to make a decision as to whether we want to treat her presumptively for  endocarditis or not.  She does have a loud murmur that was present before but apparently is louder than it has been in the past  I will discontinue the cefepime  and the cefepime   I have personally spent 84 minutes involved in face-to-face and non-face-to-face activities for this patient on the day of the visit. Professional time spent includes the following activities: Preparing to see the patient (review of tests), Obtaining and/or reviewing separately obtained history (admission/discharge record), Performing a medically appropriate examination and/or evaluation , Ordering medications/tests/procedures, referring and communicating with other health care professionals, Documenting clinical information in the EMR, Independently interpreting results (not separately reported), Communicating results to the patient/family/caregiver, Counseling and educating the patient/family/caregiver and Care coordination (not separately reported).   Evaluation of the patient requires complex antimicrobial therapy evaluation, counseling , isolation needs to reduce disease transmission and risk assessment and mitigation.     Review of Systems: Review of Systems  Constitutional:  Positive for fever. Negative for chills, malaise/fatigue and weight loss.  HENT:  Negative for congestion and sore throat.   Eyes:  Negative for blurred vision and photophobia.  Respiratory:  Negative for cough, shortness of breath and wheezing.   Cardiovascular:  Positive for leg swelling. Negative for chest pain and palpitations.  Gastrointestinal:  Negative for abdominal pain, blood in stool, constipation, diarrhea, heartburn, melena, nausea and vomiting.  Genitourinary:  Negative for dysuria, flank pain and hematuria.  Musculoskeletal:  Negative for back pain, falls, joint pain and myalgias.  Skin:  Negative for itching and rash.  Neurological:  Negative for dizziness, focal weakness, loss of consciousness, weakness and headaches.   Endo/Heme/Allergies:  Does not bruise/bleed easily.  Psychiatric/Behavioral:  Negative for depression and suicidal ideas. The patient does not have insomnia.     Past Medical History:  Diagnosis Date   Anemia    hx of   Aortic stenosis 02/2023   Echo 03/07/23 LVEF 55-60%, mild to moderate AS   Arthritis    knees   CHF (congestive heart failure) (HCC)    chronic diastolic   Diabetes mellitus without complication (HCC)    type 2   Dyspnea    Dysrhythmia    Fibromyalgia    Gastric varices    large   H/O transfusion of platelets    Heart murmur    never has caused any problems   Hx of colonic polyps    s/p partial colectomy   Hyperlipidemia    Hypertension    Joint pain    in knees and back spasms   Left leg swelling    wear compression hose   Liver cirrhosis secondary to NASH (nonalcoholic steatohepatitis) (HCC) dx nov 2014   had enlarged spleen also    Morbid obesity (HCC)    Persistent atrial fibrillation (HCC)    Pneumonia 10/2016   x 5   PONV (postoperative nausea and vomiting)    in past none recent   Portal hypertension (HCC)    Sleep apnea    uses cpap, setting varies between 14-20   Spleen enlarged    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)  Social History   Tobacco Use   Smoking status: Former    Current packs/day: 0.00    Average packs/day: 1 pack/day for 3.0 years (3.0 ttl pk-yrs)    Types: Cigarettes    Start date: 09/20/1971    Quit date: 09/19/1974    Years since quitting: 49.6   Smokeless tobacco: Never  Vaping Use   Vaping status: Never Used  Substance Use Topics   Alcohol  use: Not Currently    Comment: occ   Drug use: No    Family History  Problem Relation Age of Onset   Heart failure Mother    Cancer Mother    Hyperlipidemia Mother    Congestive Heart Failure Mother    Stroke Mother    Lung cancer Father    Cancer Father    Cancer Brother    Hyperlipidemia Brother    Stroke Other    Breast cancer Neg Hx    Allergies   Allergen Reactions   Celebrex [Celecoxib] Other (See Comments)    speech slurred with celebrex   Shellfish Allergy Swelling, Rash and Other (See Comments)    TINGLING AND SWELLING OF LIPS. LARGE WHELPS, QUARTER-SIZE   Barium-Containing Compounds Other (See Comments)    TACHYCARDIA   Fentanyl  Other (See Comments)    Hallucinations    Nsaids Other (See Comments)    Contraindication due to CKD stage 3    Prednisone Other (See Comments)    TACHYCARDIA   Sglt2 Inhibitors Other (See Comments)    Ozempic , Severe gastroparesis    Statins Other (See Comments)    AGGRAVATED FIBROMYALGIA   Tylenol  [Acetaminophen ] Other (See Comments)    UNSPECIFIED SPECIFIC REACTION >> DUE TO PLATELETS   Ibuprofen  Other (See Comments)    UNSPECIFIED SPECIFIC REACTION >> DUE TO PLATELETS   Tramadol  Other (See Comments)    Hallucination    Zetia [Ezetimibe] Other (See Comments)    Headache    OBJECTIVE: Blood pressure 112/66, pulse 86, temperature 98.6 F (37 C), temperature source Oral, resp. rate 16, height 5' 7 (1.702 m), weight 130.7 kg, SpO2 100%.  Physical Exam Constitutional:      General: She is not in acute distress.    Appearance: Normal appearance. She is well-developed. She is not ill-appearing or diaphoretic.  HENT:     Head: Normocephalic and atraumatic.     Right Ear: Hearing and external ear normal.     Left Ear: Hearing and external ear normal.     Nose: No nasal deformity or rhinorrhea.  Eyes:     General: No scleral icterus.    Conjunctiva/sclera: Conjunctivae normal.     Right eye: Right conjunctiva is not injected.     Left eye: Left conjunctiva is not injected.     Pupils: Pupils are equal, round, and reactive to light.  Neck:     Vascular: No JVD.  Cardiovascular:     Rate and Rhythm: Normal rate and regular rhythm.     Heart sounds: S1 normal and S2 normal. Murmur heard.     No friction rub.  Pulmonary:     Effort: No respiratory distress.     Breath  sounds: No stridor. No wheezing or rhonchi.  Abdominal:     General: There is no distension.     Palpations: Abdomen is soft.  Musculoskeletal:        General: Normal range of motion.     Right shoulder: Normal.     Left shoulder: Normal.  Cervical back: Normal range of motion and neck supple.     Right hip: Normal.     Left hip: Normal.     Right knee: Normal.     Left knee: Normal.  Lymphadenopathy:     Head:     Right side of head: No submandibular, preauricular or posterior auricular adenopathy.     Left side of head: No submandibular, preauricular or posterior auricular adenopathy.     Cervical: No cervical adenopathy.     Right cervical: No superficial or deep cervical adenopathy.    Left cervical: No superficial or deep cervical adenopathy.  Skin:    General: Skin is warm and dry.     Coloration: Skin is not pale.     Findings: No abrasion, bruising, ecchymosis, erythema, lesion or rash.     Nails: There is no clubbing.  Neurological:     Mental Status: She is alert and oriented to person, place, and time.     Sensory: No sensory deficit.     Coordination: Coordination normal.     Gait: Gait normal.  Psychiatric:        Attention and Perception: She is attentive.        Mood and Affect: Mood normal.        Speech: Speech normal.        Behavior: Behavior normal. Behavior is cooperative.        Thought Content: Thought content normal.        Judgment: Judgment normal.    Lower extremities            Lab Results Lab Results  Component Value Date   WBC 3.8 (L) 04/21/2024   HGB 7.0 (L) 04/21/2024   HCT 22.8 (L) 04/21/2024   MCV 90.5 04/21/2024   PLT 32 (L) 04/21/2024    Lab Results  Component Value Date   CREATININE 2.02 (H) 04/21/2024   BUN 53 (H) 04/21/2024   NA 131 (L) 04/21/2024   K 4.0 04/21/2024   CL 99 04/21/2024   CO2 22 04/21/2024    Lab Results  Component Value Date   ALT 19 04/21/2024   AST 29 04/21/2024   ALKPHOS 62 04/21/2024    BILITOT 3.3 (H) 04/21/2024     Microbiology: Recent Results (from the past 240 hours)  Culture, blood (routine x 2)     Status: Abnormal   Collection Time: 04/18/24  4:46 PM   Specimen: BLOOD RIGHT ARM  Result Value Ref Range Status   Specimen Description   Final    BLOOD RIGHT ARM Performed at [facility], [address] [place], [place] [address]    Special Requests   Final    BOTTLES DRAWN AEROBIC AND ANAEROBIC Blood Culture adequate volume Performed at [facility], [address] [place], [place] [address]    Culture  Setup Time   Final    GRAM POSITIVE COCCI IN CLUSTERS IN BOTH AEROBIC AND ANAEROBIC BOTTLES CRITICAL RESULT CALLED TO, READ BACK BY AND VERIFIED WITH: PHARMD N. GLOGOVAC [id number] @ 1650 FH Performed at [facility], [address], [place], [place] [address]    Culture STAPHYLOCOCCUS EPIDERMIDIS (A)  Final   Report Status 04/21/2024 FINAL  Final   Organism ID, Bacteria STAPHYLOCOCCUS EPIDERMIDIS  Final      Susceptibility   Staphylococcus epidermidis - MIC*    CIPROFLOXACIN <=0.5 SENSITIVE Sensitive     ERYTHROMYCIN >=8 RESISTANT Resistant  GENTAMICIN <=0.5 SENSITIVE Sensitive     OXACILLIN >=4 RESISTANT Resistant     TETRACYCLINE 2 SENSITIVE Sensitive     VANCOMYCIN  1 SENSITIVE Sensitive     TRIMETH/SULFA 20 SENSITIVE Sensitive     CLINDAMYCIN  >=8 RESISTANT Resistant     RIFAMPIN  8 RESISTANT Resistant     Inducible Clindamycin  NEGATIVE Sensitive     * STAPHYLOCOCCUS EPIDERMIDIS  Blood Culture ID Panel (Reflexed)     Status: Abnormal   Collection Time: 04/18/24  4:46 PM  Result Value Ref Range Status   Enterococcus faecalis NOT DETECTED NOT DETECTED Final   Enterococcus Faecium NOT DETECTED NOT DETECTED Final   Listeria monocytogenes NOT DETECTED NOT DETECTED Final   Staphylococcus species DETECTED (A) NOT DETECTED Final    Comment: CRITICAL RESULT CALLED TO, READ BACK BY AND VERIFIED WITH: PHARMD N. GLOGOVAC [id number] @  1650 FH    Staphylococcus aureus (BCID) NOT DETECTED NOT DETECTED Final   Staphylococcus epidermidis DETECTED (A) NOT DETECTED Final    Comment: Methicillin (oxacillin) resistant coagulase negative staphylococcus. Possible blood culture contaminant (unless isolated from more than one blood culture draw or clinical case suggests pathogenicity). No antibiotic treatment is indicated for blood  culture contaminants. CRITICAL RESULT CALLED TO, READ BACK BY AND VERIFIED WITH: PHARMD N. GLOGOVAC [id number] @ 1650 FH    Staphylococcus lugdunensis NOT DETECTED NOT DETECTED Final   Streptococcus species NOT DETECTED NOT DETECTED Final   Streptococcus agalactiae NOT DETECTED NOT DETECTED Final   Streptococcus pneumoniae NOT DETECTED NOT DETECTED Final   Streptococcus pyogenes NOT DETECTED NOT DETECTED Final   A.calcoaceticus-baumannii NOT DETECTED NOT DETECTED Final   Bacteroides fragilis NOT DETECTED NOT DETECTED Final   Enterobacterales NOT DETECTED NOT DETECTED Final   Enterobacter cloacae complex NOT DETECTED NOT DETECTED Final   Escherichia coli NOT DETECTED NOT DETECTED Final   Klebsiella aerogenes NOT DETECTED NOT DETECTED Final   Klebsiella oxytoca NOT DETECTED NOT DETECTED Final   Klebsiella pneumoniae NOT DETECTED NOT DETECTED Final   Proteus species NOT DETECTED NOT DETECTED Final   Salmonella species NOT DETECTED NOT DETECTED Final   Serratia marcescens NOT DETECTED NOT DETECTED Final   Haemophilus influenzae NOT DETECTED NOT DETECTED Final   Neisseria meningitidis NOT DETECTED NOT DETECTED Final   Pseudomonas aeruginosa NOT DETECTED NOT DETECTED Final   Stenotrophomonas maltophilia NOT DETECTED NOT DETECTED Final   Candida albicans NOT DETECTED NOT DETECTED Final   Candida auris NOT DETECTED NOT DETECTED Final   Candida glabrata NOT DETECTED NOT DETECTED Final   Candida krusei NOT DETECTED NOT DETECTED Final   Candida parapsilosis NOT DETECTED NOT DETECTED Final   Candida tropicalis  NOT DETECTED NOT DETECTED Final   Cryptococcus neoformans/gattii NOT DETECTED NOT DETECTED Final   Methicillin resistance mecA/C DETECTED (A) NOT DETECTED Final    Comment: CRITICAL RESULT CALLED TO, READ BACK BY AND VERIFIED WITH: PHARMD N. GLOGOVAC [id number] @ 1650 FH Performed at [facility], [address] [place], [place] [address]   Culture, blood (routine x 2)     Status: Abnormal   Collection Time: 04/18/24  4:51 PM   Specimen: BLOOD LEFT ARM  Result Value Ref Range Status   Specimen Description   Final    BLOOD LEFT ARM Performed at [facility], [address], [place], [place] [address]    Special Requests   Final    BOTTLES DRAWN AEROBIC AND ANAEROBIC Blood Culture adequate volume Performed at [facility], [phone] [place], [place] [address]    Culture  Setup Time   Final    GRAM POSITIVE COCCI IN CLUSTERS IN BOTH AEROBIC AND ANAEROBIC BOTTLES CRITICAL RESULT CALLED TO, READ BACK BY AND VERIFIED WITH: PHARMD N. GLOGOVAC [id number] @ 1650 FH Performed at [facility], [address], [place], [place] [address]    Culture STAPHYLOCOCCUS EPIDERMIDIS (A)  Final   Report Status 04/21/2024 FINAL  Final   Organism ID, Bacteria STAPHYLOCOCCUS EPIDERMIDIS  Final      Susceptibility   Staphylococcus epidermidis - MIC*    CIPROFLOXACIN <=0.5 SENSITIVE Sensitive     ERYTHROMYCIN >=8 RESISTANT Resistant     GENTAMICIN <=0.5 SENSITIVE Sensitive     OXACILLIN >=4 RESISTANT Resistant     TETRACYCLINE 2 SENSITIVE Sensitive     VANCOMYCIN  2 SENSITIVE Sensitive     TRIMETH/SULFA 20 SENSITIVE Sensitive     CLINDAMYCIN  >=8 RESISTANT Resistant     RIFAMPIN  16 RESISTANT Resistant     Inducible Clindamycin  NEGATIVE Sensitive     * STAPHYLOCOCCUS EPIDERMIDIS  Resp panel by RT-PCR (RSV, Flu A&B, Covid) Anterior Nasal Swab     Status: None   Collection Time: 04/19/24  1:22 AM   Specimen: Anterior Nasal Swab  Result Value Ref Range Status   SARS  Coronavirus 2 by RT PCR NEGATIVE NEGATIVE Final    Comment: (NOTE) SARS-CoV-2 target nucleic acids are NOT DETECTED.  The SARS-CoV-2 RNA is generally detectable in upper respiratory specimens during the acute phase of infection. The lowest concentration of SARS-CoV-2 viral copies this assay can detect is 138 copies/mL. A negative result does not preclude SARS-Cov-2 infection and should not be used as the sole basis for treatment or other patient management decisions. A negative result may occur with  improper specimen collection/handling, submission of specimen other than nasopharyngeal swab, presence of viral mutation(s) within the areas targeted by this assay, and inadequate number of viral copies(<138 copies/mL). A negative result must be combined with clinical observations, patient history, and epidemiological information. The expected result is Negative.  Fact Sheet for Patients:  BloggerCourse.com  Fact Sheet for Healthcare Providers:  SeriousBroker.it  This test is no t yet approved or cleared by the [place]  FDA and  has been authorized for detection and/or diagnosis of SARS-CoV-2 by FDA under an Emergency Use Authorization (EUA). This EUA will remain  in effect (meaning this test can be used) for the duration of the COVID-19 declaration under Section 564(b)(1) of the Act, 21 U.S.C.section 360bbb-3(b)(1), unless the authorization is terminated  or revoked sooner.       Influenza A by PCR NEGATIVE NEGATIVE Final   Influenza B by PCR NEGATIVE NEGATIVE Final    Comment: (NOTE) The Xpert Xpress SARS-CoV-2/FLU/RSV plus assay is intended as an aid in the diagnosis of influenza from Nasopharyngeal swab specimens and should not be used as a sole basis for treatment. Nasal washings and aspirates are unacceptable for Xpert Xpress SARS-CoV-2/FLU/RSV testing.  Fact Sheet for  Patients: BloggerCourse.com  Fact Sheet for Healthcare Providers: SeriousBroker.it  This test is not yet approved or cleared by the [place]  FDA and has been authorized for detection and/or diagnosis of SARS-CoV-2 by FDA under an Emergency Use Authorization (EUA). This EUA will remain in effect (meaning this test can be used) for the duration of the COVID-19 declaration under Section 564(b)(1) of the Act, 21 U.S.C. section 360bbb-3(b)(1), unless the authorization is terminated or revoked.     Resp Syncytial  Virus by PCR NEGATIVE NEGATIVE Final    Comment: (NOTE) Fact Sheet for Patients: BloggerCourse.com  Fact Sheet for Healthcare Providers: SeriousBroker.it  This test is not yet approved or cleared by the [place]  FDA and has been authorized for detection and/or diagnosis of SARS-CoV-2 by FDA under an Emergency Use Authorization (EUA). This EUA will remain in effect (meaning this test can be used) for the duration of the COVID-19 declaration under Section 564(b)(1) of the Act, 21 U.S.C. section 360bbb-3(b)(1), unless the authorization is terminated or revoked.  Performed at [facility], [address] [place], [place] [address]   MRSA Next Gen by PCR, Nasal     Status: None   Collection Time: 04/19/24  1:22 AM   Specimen: Anterior Nasal Swab  Result Value Ref Range Status   MRSA by PCR Next Gen NOT DETECTED NOT DETECTED Final    Comment: (NOTE) The GeneXpert MRSA Assay (FDA approved for NASAL specimens only), is one component of a comprehensive MRSA colonization surveillance program. It is not intended to diagnose MRSA infection nor to guide or monitor treatment for MRSA infections. Test performance is not FDA approved in patients less than 52 years old. Performed at [facility], [address], [place], [place] [address]    Respiratory (~20 pathogens) panel by PCR     Status: None   Collection Time: 04/19/24  9:00 AM   Specimen: Nasopharyngeal Swab; Respiratory  Result Value Ref Range Status   Adenovirus NOT DETECTED NOT DETECTED Final   Coronavirus 229E NOT DETECTED NOT DETECTED Final    Comment: (NOTE) The Coronavirus on the Respiratory Panel, DOES NOT test for the novel  Coronavirus (2019 nCoV)    Coronavirus HKU1 NOT DETECTED NOT DETECTED Final   Coronavirus NL63 NOT DETECTED NOT DETECTED Final   Coronavirus OC43 NOT DETECTED NOT DETECTED Final   Metapneumovirus NOT DETECTED NOT DETECTED Final   Rhinovirus / Enterovirus NOT DETECTED NOT DETECTED Final   Influenza A NOT DETECTED NOT DETECTED Final   Influenza B NOT DETECTED NOT DETECTED Final   Parainfluenza Virus 1 NOT DETECTED NOT DETECTED Final   Parainfluenza Virus 2 NOT DETECTED NOT DETECTED Final   Parainfluenza Virus 3 NOT DETECTED NOT DETECTED Final   Parainfluenza Virus 4 NOT DETECTED NOT DETECTED Final   Respiratory Syncytial Virus NOT DETECTED NOT DETECTED Final   Bordetella pertussis NOT DETECTED NOT DETECTED Final   Bordetella Parapertussis NOT DETECTED NOT DETECTED Final   Chlamydophila pneumoniae NOT DETECTED NOT DETECTED Final   Mycoplasma pneumoniae NOT DETECTED NOT DETECTED Final    Comment: Performed at [facility], [address], [place], [place] [address]    Jomarie Fleeta Rothman, MD [facility] Infect[facility] [phone] pager  04/21/2024, 4:17 PM

## 2024-04-21 NOTE — TOC Initial Note (Signed)
 Transition of Care [facility]) - Initial/Assessment Note    Patient Details  Name: Hannah Khan MRN: [id number] Date of Birth: Jan 12, 1956  Transition of Care [facility]) CM/SW Contact:    Sonda Manuella Quill, RN Phone Number: 04/21/2024, 2:26 PM  Clinical Narrative:                 Orders received for HHPT; spoke w/ pt in room; patient says she lives at home; pt identified PO souse [name] [phone]); she plans to return at d/c; he husband will provide transportation; pt verified insurance/PCP; she denied SDOH risks; pt has cane, Rollator, BSC, and, shower chair; she also attends OP physical therapy; pt agreed to receive recc HHPT; she does not have an agency preference; she requests not to have [facility]; [facility] and [facility] cannot accept; referral given to Gaetana Dadds at 1312; she said agency can provide service; pt notified; agency contact info placed in follow up provider section of d/c instructions; no TOC needs; TOC is signing off; please place consult if needed.  Expected Discharge Plan: Home w Home Health Services Barriers to Discharge: Continued Medical Work up   Patient Goals and CMS Choice Patient states their goals for this hospitalization and ongoing recovery are:: home CMS Medicare.gov Compare Post Acute Care list provided to:: Patient   [place] ownership interest in [facility].provided to:: Patient    Expected Discharge Plan and Services   Discharge Planning Services: CM Consult   Living arrangements for the past 2 months: Single Family Home                 DME Arranged: N/A DME Agency: NA       HH Arranged: PT HH Agency: [facility] Date HH Agency Contacted: 04/21/24 Time HH Agency Contacted: [phone]    Prior Living Arrangements/Services Living arrangements for the past 2 months: Single Family Home Lives with:: Spouse Hannah Khan (spouse) [phone])   Do you feel safe going back to the place where you live?: Yes      Need for  Family Participation in Patient Care: Yes (Comment) Care giver support system in place?: Yes (comment) Current home services: DME (cane, Rollator, BSC, shower chair) Criminal Activity/Legal Involvement Pertinent to Current Situation/Hospitalization: No - Comment as needed  Activities of Daily Living   ADL Screening (condition at time of admission) Independently performs ADLs?: No Does the patient have a NEW difficulty with bathing/dressing/toileting/self-feeding that is expected to last >3 days?: Yes (Initiates electronic notice to provider for possible OT consult) Does the patient have a NEW difficulty with getting in/out of bed, walking, or climbing stairs that is expected to last >3 days?: Yes (Initiates electronic notice to provider for possible PT consult) Does the patient have a NEW difficulty with communication that is expected to last >3 days?: No Is the patient deaf or have difficulty hearing?: No Does the patient have difficulty seeing, even when wearing glasses/contacts?: No Does the patient have difficulty concentrating, remembering, or making decisions?: No  Permission Sought/Granted Permission sought to share information with : Case Manager Permission granted to share information with : Yes, Verbal Permission Granted  Share Information with NAME: Case Manager     Permission granted to share info w Relationship: Emmalena Canny (spouse) [phone]     Emotional Assessment Appearance:: Appears stated age Attitude/Demeanor/Rapport: Gracious Affect (typically observed): Accepting Orientation: : Oriented to Self, Oriented to Place, Oriented to  Time, Oriented to Situation Alcohol  / Substance Use: Not Applicable Psych Involvement: No (  comment)  Admission diagnosis:  Thrombocytopenia (HCC) [D69.6] Atrial fibrillation with rapid ventricular response (HCC) [I48.91] Cellulitis of left lower extremity [L03.116] Sepsis (HCC) [A41.9] Symptomatic anemia [D64.9] Acute sepsis (HCC)  [A41.9] Patient Active Problem List   Diagnosis Date Noted   CHF (congestive heart failure) (HCC) 02/02/2024   Hypoxia 07/04/2023   Nausea & vomiting 07/04/2023   Nausea and vomiting 07/03/2023   Intractable nausea and vomiting 04/07/2023   Intractable vomiting with nausea 04/06/2023   Acute on chronic diastolic heart failure (HCC) 03/06/2023   Chronic hypotension 03/06/2023   Sepsis due to cellulitis (HCC) 03/05/2023   Severe sepsis (HCC) 03/05/2023   Portal hypertension (HCC) 12/23/2022   Acute encephalopathy 01/10/2021   Secondary hypercoagulable state (HCC) 06/23/2020   Streptococcal bacteremia 03/16/2020   Chronic venous stasis dermatitis of both lower extremities 03/16/2020   Atrial fibrillation with RVR (HCC) 04/26/2018   Acute lower UTI 04/26/2018   Liver cirrhosis secondary to NASH (HCC) 04/26/2018   Hyperlipidemia 04/26/2018   Type 2 diabetes mellitus (HCC)    Cellulitis of right lower extremity 03/15/2018   Sepsis (HCC) 03/14/2018   Community acquired pneumonia of right lower lobe of lung 10/31/2016   Muscular abdominal pain in right upper quadrant 07/11/2016   Chronic heart failure with preserved ejection fraction (HFpEF) (HCC) 07/10/2016   Thrombocytopenia (HCC) 07/10/2016   Permanent atrial fibrillation (HCC) 01/30/2016   Morbid obesity- 12/10/2015   Cellulitis, abdominal wall    Melena 01/31/2015   Aortic heart murmur 01/31/2015   Anemia 07/23/2013   Obstructive sleep apnea 08/07/2007   Essential hypertension 08/07/2007   Allergic rhinitis 08/07/2007   PCP:  Regino Slater, MD Pharmacy:   [id number] - [place], [place] - [address] [place] [address] Phone: [phone] Fax: [phone]  [facility] [address] [place] [address] Phone: [phone] Fax: [phone]     Social Drivers of Health (SDOH) Social History: SDOH Screenings   Food Insecurity: No Food  Insecurity (04/21/2024)  Housing: Low Risk  (04/21/2024)  Transportation Needs: No Transportation Needs (04/21/2024)  Utilities: Not At Risk (04/21/2024)  Social Connections: Socially Integrated (04/19/2024)  Tobacco Use: Medium Risk (04/18/2024)   SDOH Interventions: Food Insecurity Interventions: Intervention Not Indicated, Inpatient TOC Housing Interventions: Intervention Not Indicated, Inpatient TOC Transportation Interventions: Intervention Not Indicated, Inpatient TOC Utilities Interventions: Intervention Not Indicated, Inpatient TOC   Readmission Risk Interventions    04/21/2024    2:22 PM 04/08/2023    9:42 AM 03/12/2023    1:47 PM  Readmission Risk Prevention Plan  Transportation Screening Complete Complete Complete  PCP or Specialist Appt within 3-5 Days  Complete   Home Care Screening   Complete  Medication Review (RN CM)   Complete  HRI or Home Care Consult  Complete   Social Work Consult for Recovery Care Planning/Counseling  Complete   Palliative Care Screening  Not Applicable   Medication Review [facility]) Complete Complete   PCP or Specialist appointment within 3-5 days of discharge Complete    HRI or Home Care Consult Complete    SW Recovery Care/Counseling Consult Complete    Palliative Care Screening Not Applicable    Skilled Nursing Facility Not Applicable

## 2024-04-21 NOTE — Evaluation (Signed)
 Occupational Therapy Evaluation Patient Details Name: Hannah Khan MRN: [id number] DOB: Feb 24, 1956 Today's Date: 04/21/2024   History of Present Illness   68 y.o. female who presented due to concern for left thigh rash to [facility] OP clinic and referred to ED for r/o DVT, now transferred from [facility] ED for further treatment of cellulitis, possible pneumonia, and A-fib RVR. Pt with hx of decompensated Nash cirrhosis, prior history of EV bleed , failed TIPS, chronic anemia and thrombocytopenia, A-fib not on anticoagulation, mild to moderate aortic stenosis, HFpEF, CKD stage IIIb, diabetes type 2, hypertension, morbid obesity, OSA on CPAP, fibromyalgia.     Clinical Impressions PTA, pt lives with spouse, typically Modified Independent with ADLs using AE, mobility using Rollator and shares IADLs with spouse. Pt presents now with deficits in strength, dynamic standing balance and endurance but reports ongoing improvements since admission. Pt able to mobilize in room and to bathroom using RW with Supervision, manage LB ADLs with no more than Min A without available AE. Discussed OT follow up w/ pt reporting no OT needs upon DC but agreeable for ongoing acute OT services.      If plan is discharge home, recommend the following:   Assistance with cooking/housework;Assist for transportation     Functional Status Assessment   Patient has had a recent decline in their functional status and demonstrates the ability to make significant improvements in function in a reasonable and predictable amount of time.     Equipment Recommendations   None recommended by OT     Recommendations for Other Services         Precautions/Restrictions   Precautions Precautions: Fall Restrictions Weight Bearing Restrictions Per Provider Order: No     Mobility Bed Mobility Overal bed mobility: Modified Independent Bed Mobility: Supine to Sit     Supine to sit: Modified independent (Device/Increase  time)          Transfers Overall transfer level: Needs assistance Equipment used: Rolling walker (2 wheels) Transfers: Sit to/from Stand Sit to Stand: Supervision                  Balance Overall balance assessment: Needs assistance Sitting-balance support: Feet supported, No upper extremity supported Sitting balance-Leahy Scale: Good     Standing balance support: Bilateral upper extremity supported, During functional activity, No upper extremity supported Standing balance-Leahy Scale: Fair                             ADL either performed or assessed with clinical judgement   ADL Overall ADL's : Needs assistance/impaired Eating/Feeding: Independent   Grooming: Supervision/safety;Standing   Upper Body Bathing: Set up;Sitting   Lower Body Bathing: Minimal assistance;Sitting/lateral leans;Sit to/from stand   Upper Body Dressing : Set up;Sitting   Lower Body Dressing: Minimal assistance;Sitting/lateral leans;Sit to/from stand   Toilet Transfer: Supervision/safety;Ambulation;Rolling walker (2 wheels)   Toileting- Clothing Manipulation and Hygiene: Minimal assistance;Sit to/from stand;Sitting/lateral lean Toileting - Clothing Manipulation Details (indicate cue type and reason): for posterior hygiene, does not have toileting aide with her     Functional mobility during ADLs: Supervision/safety;Rolling walker (2 wheels)       Vision Baseline Vision/History: 6 Macular Degeneration;1 Wears glasses Ability to See in Adequate Light: 1 Impaired Patient Visual Report: No change from baseline Vision Assessment?: No apparent visual deficits     Perception         Praxis  Pertinent Vitals/Pain Pain Assessment Pain Assessment: No/denies pain     Extremity/Trunk Assessment Upper Extremity Assessment Upper Extremity Assessment: Overall WFL for tasks assessed;Right hand dominant   Lower Extremity Assessment Lower Extremity Assessment: Defer  to PT evaluation   Cervical / Trunk Assessment Cervical / Trunk Assessment: Normal   Communication Communication Communication: No apparent difficulties   Cognition Arousal: Alert Behavior During Therapy: WFL for tasks assessed/performed Cognition: No apparent impairments                               Following commands: Intact       Cueing  General Comments   Cueing Techniques: Verbal cues      Exercises     Shoulder Instructions      Home Living Family/patient expects to be discharged to:: Private residence Living Arrangements: Spouse/significant other Available Help at Discharge: Family;Available 24 hours/day Type of Home: House Home Access: Stairs to enter [employer] of Steps: 2 Entrance Stairs-Rails: None Home Layout: One level     Bathroom Shower/Tub: Chief Strategy Officer: Standard     Home Equipment: Rollator (4 wheels);Tub bench;BSC/3in1;Hand held shower head;Adaptive equipment Adaptive Equipment: Other (Comment) (toileting aide) Additional Comments: CPAP at night and adjustable bed      Prior Functioning/Environment Prior Level of Function : Independent/Modified Independent;Driving             Mobility Comments: rollator at all times, uses electric shopping cart in the community ADLs Comments: ind with ADL, uses toileting aide for hygiene (has a small foldable one she can carry in her purse too), manages own meds, does light meal prep, drives during the day only due to macular degeneration    OT Problem List: Decreased activity tolerance;Impaired balance (sitting and/or standing);Decreased strength;Cardiopulmonary status limiting activity   OT Treatment/Interventions: Self-care/ADL training;Therapeutic exercise;Energy conservation;DME and/or AE instruction;Therapeutic activities;Patient/family education;Balance training      OT Goals(Current goals can be found in the care plan section)   Acute Rehab OT  Goals Patient Stated Goal: feel better, hopes that lactulose  continues to keep her liver healthy OT Goal Formulation: With patient Time For Goal Achievement: 05/05/24 Potential to Achieve Goals: Good   OT Frequency:  Min 2X/week    Co-evaluation              AM-PAC OT 6 Clicks Daily Activity     Outcome Measure Help from another person eating meals?: None Help from another person taking care of personal grooming?: A Little Help from another person toileting, which includes using toliet, bedpan, or urinal?: A Little Help from another person bathing (including washing, rinsing, drying)?: A Little Help from another person to put on and taking off regular upper body clothing?: A Little Help from another person to put on and taking off regular lower body clothing?: A Little 6 Click Score: 19   End of Session Equipment Utilized During Treatment: Rolling walker (2 wheels) Nurse Communication: Other (comment) (discussed with NT)  Activity Tolerance: Patient tolerated treatment well Patient left: Other (comment) (on toilet; aware of need to pull call bell when done; NT aware)  OT Visit Diagnosis: Muscle weakness (generalized) (M62.81);Unsteadiness on feet (R26.81)                Time: 9071-9044 OT Time Calculation (min): 27 min Charges:  OT General Charges $OT Visit: 1 Visit OT Evaluation $OT Eval Low Complexity: 1 Low OT Treatments $Self Care/Home Management :  8-22 mins  Mliss NOVAK, OTR/L Acute Rehab Services Office: [phone]   Mliss Fish 04/21/2024, 10:01 AM

## 2024-04-21 NOTE — Progress Notes (Signed)
   04/21/24 2216  BiPAP/CPAP/SIPAP  Reason BIPAP/CPAP not in use Other(comment) (PATIENT NOT READY TO PUT ON CPAP YET.)

## 2024-04-21 NOTE — Progress Notes (Signed)
 Per phlebotomy, patient has been stuck the maximum about of times this shift- unable to get blood for blood cultures. MD Gonfa made aware.

## 2024-04-21 NOTE — Progress Notes (Signed)
 PROGRESS NOTE  Hannah Khan FMW:[id number] DOB: [birth year]   PCP: Regino Slater, MD  Patient is from: Home.  DOA: 04/18/2024 LOS: 2  Chief complaints Chief Complaint  Patient presents with   Leg Swelling     Brief Narrative / Interim history: 68 year old F with PMH of [place] cirrhosis, EV bleed s/p BRTO, failed TIPS, HFpEF, A-fib not on anticoagulation, CKD-3B, DM-2, anemia, thrombocytopenia, morbid obesity and OSA on CPAP sent to ED from PCP office after she presented there with left thigh rash, lightheadedness, dizziness and low blood pressure, and admitted with sepsis in the setting of left thigh cellulitis, possible left lung pneumonia and A-fib with RVR.  LLE venous Doppler negative for DVT.  Patient was started on Cardizem  drip for A-fib and broad-spectrum antibiotics for sepsis/cellulitis.   Blood cultures with Staph epidermidis in 4 out of 4 bottles.  ID, Dr. Fleeta dam consulted and recommended adding daptomycin  and doxycycline  pending culture sensitivity.  Repeat blood culture ordered.  Subjective: Seen and examined earlier this morning.  No major events overnight or this morning.  Sleepy but wakes to voice.  No complaints.  Denies chest pain, shortness of breath, GI or UTI symptoms.  Reports multiple bowel movements from lactulose .  Denies blood in stool or dark tarry looking stool.  Objective: Vitals:   04/21/24 0043 04/21/24 0350 04/21/24 0448 04/21/24 0803  BP: 114/63 (!) 131/48  124/61  Pulse: 92 98  99  Resp: 19 20  18   Temp: 98.2 F (36.8 C) 97.7 F (36.5 C)  98.4 F (36.9 C)  TempSrc: Oral Oral    SpO2: 98% 99%  97%  Weight:   130.7 kg   Height:        Examination:  GENERAL: No apparent distress.  Nontoxic. HEENT: MMM.  Vision and hearing grossly intact.  NECK: Supple.  No apparent JVD.  RESP:  No IWOB.  Fair aeration bilaterally. CVS: HR in 110s.  Regular.  3/6 SEM over RUSB and LUSB. ABD/GI/GU: BS+. Abd soft, NTND.  MSK/EXT:  Moves extremities.  Very  mild septal erythema over medial aspect of left thigh. SKIN: no apparent skin lesion or wound NEURO: AA.  Oriented appropriately.  No apparent focal neuro deficit. PSYCH: Calm. Normal affect.   Consultants:  Infectious disease  Procedures: None  Microbiology summarized: COVID-19, influenza and RSV PCR nonreactive MRSA PCR screen negative. Blood cultures with staph epidermis in 4 out of 4 bottles  Assessment and plan: Severe sepsis due to LLE cellulitis and possible left lung pneumonia: Present on admission.  Febrile, tachycardic and tachypneic on presentation.  Lactic acid 2.6.  CXR with retrocardiac opacity but patient without respiratory symptoms.  Blood culture with staph epidermis in 4 out of 4 bottles.  MRSA PCR screen negative.  LLE venous Doppler negative for DVT.  Sepsis physiology resolving. - Ceftriaxone  7/31> Zosyn  8/1> cefepime  8/1> -Zithromax  7/31> doxycycline  and daptomycin  8/2>> per ID. - Repeat blood culture on 8/3 - Follow culture sensitivity  Persistent A-fib with RVR: RVR resolved.  Transitioned to p.o. metoprolol  and digoxin .  TSH normal..  -Increased metoprolol  to 50 mg twice daily on 8/2 -Continue digoxin  -Not on anticoagulation due to GI bleed -Optimize electrolytes  Acute on chronic blood loss anemia: denies overt bleeding.  H&H seems to be stable after 2 units Recent Labs    02/04/24 1115 02/05/24 0204 02/06/24 0240 02/07/24 0212 04/18/24 1908 04/19/24 0317 04/19/24 1517 04/19/24 1518 04/20/24 0305 04/21/24 0542  HGB 7.3* 7.4* 7.3* 7.2* 5.9* 6.0*  7.8* 7.8* 7.1* 7.0*  - Transfuse 1 unit. - Continue Protonix  - Appreciate input by GI-outpatient EGD  NASH cirrhosis: Appears compensated.  Ammonia 42.  No encephalopathy. -MELD-Na score 25. -Continue lactulose  and rifaximin . -Continue holding diuretics due to AKI   AKI on CKD-3B: b/l Cr ~1.7-2.0.  AKI likely due to A-fib, anemia, vancomycin  and sepsis.  Improved. Recent Labs    02/02/24 0900  02/03/24 0812 02/04/24 0227 02/05/24 0204 02/06/24 0240 02/07/24 0212 04/18/24 1908 04/19/24 0317 04/20/24 0305 04/21/24 0542  BUN 37* 34* 32* 27* 29* 29* 40* 46* 52* 53*  CREATININE 2.05* 2.11* 1.98* 1.93* 1.81* 1.75* 2.21* 2.14* 2.35* 2.02*  - Continue holding diuretics - Avoid nephrotoxic meds - Recheck in the morning  IDDM-2 with hyperglycemia: A1c 6.0%. Recent Labs  Lab 04/20/24 1145 04/20/24 1557 04/20/24 1713 04/20/24 2105 04/21/24 0757  GLUCAP 234* 240* 218* 219* 209*  -Continue Semglee  24 units daily -Increase SSI to moderate -Increase NovoLog  from 3 to 5 units 3 times daily with meals  OSA on CPAP - Continue CPAP  Hyponatremia: Dilutional?  Improved. - Continue monitoring  Thrombocytopenia: Likely due to liver cirrhosis. - Continue monitoring  Morbid obesity Body mass index is 45.13 kg/m. - Encourage lifestyle change to lose weight          DVT prophylaxis:  SCDs Start: 04/19/24 0212  Code Status: Full code Family Communication: None at bedside Level of care: Progressive Status is: Inpatient Remains inpatient appropriate because: Severe sepsis, possible bacteremia, pneumonia, A-fib with RVR and AKI   Final disposition: Home   55 minutes with more than 50% spent in reviewing records, counseling patient/family and coordinating care.   Sch Meds:  Scheduled Meds:  sodium chloride    Intravenous Once   sodium chloride    Intravenous Once   Chlorhexidine  Gluconate Cloth  6 each Topical QHS   digoxin   125 mcg Oral Daily   doxycycline   100 mg Oral Q12H   insulin  aspart  0-15 Units Subcutaneous TID WC   insulin  aspart  0-5 Units Subcutaneous QHS   insulin  aspart  3 Units Subcutaneous TID WC   insulin  glargine-yfgn  24 Units Subcutaneous Daily   lactulose   20 g Oral TID   metoprolol  tartrate  50 mg Oral BID   midodrine   10 mg Oral TID with meals   pantoprazole   40 mg Oral BID   rifaximin   550 mg Oral BID   sodium chloride  flush  3 mL  Intravenous Q12H   Continuous Infusions:  ceFEPime  (MAXIPIME ) IV 2 g (04/20/24 2211)   DAPTOmycin  Stopped (04/20/24 1121)   famotidine  (PEPCID ) IV 20 mg (04/21/24 0934)   PRN Meds:.albuterol , diphenhydrAMINE , hydrOXYzine , lidocaine , melatonin, methocarbamol , ondansetron  (ZOFRAN ) IV, mouth rinse, oxyCODONE  **OR** oxyCODONE   Antimicrobials: Anti-infectives (From admission, onward)    Start     Dose/Rate Route Frequency Ordered Stop   04/21/24 0300  vancomycin  (VANCOREADY) IVPB 1500 mg/300 mL  Status:  Discontinued        1,500 mg 150 mL/hr over 120 Minutes Intravenous Every 48 hours 04/19/24 0229 04/19/24 0941   04/20/24 1000  doxycycline  (VIBRA -TABS) tablet 100 mg        100 mg Oral Every 12 hours 04/20/24 0857     04/20/24 1000  DAPTOmycin  (CUBICIN ) IVPB 700 mg/144mL premix        8 mg/kg  91.2 kg (Adjusted) 200 mL/hr over 30 Minutes Intravenous Daily 04/20/24 0859     04/19/24 1800  cefTRIAXone  (ROCEPHIN ) 2 g in sodium chloride  0.9 %  100 mL IVPB  Status:  Discontinued        2 g 200 mL/hr over 30 Minutes Intravenous Every 24 hours 04/19/24 0215 04/19/24 0942   04/19/24 1500  ceFEPIme  (MAXIPIME ) 2 g in sodium chloride  0.9 % 100 mL IVPB        2 g 200 mL/hr over 30 Minutes Intravenous Every 12 hours 04/19/24 1450     04/19/24 1000  rifaximin  (XIFAXAN ) tablet 550 mg        550 mg Oral 2 times daily 04/19/24 0218     04/19/24 1000  piperacillin -tazobactam (ZOSYN ) IVPB 3.375 g  Status:  Discontinued        3.375 g 12.5 mL/hr over 240 Minutes Intravenous Every 8 hours 04/19/24 0942 04/19/24 1449   04/19/24 0315  vancomycin  (VANCOREADY) IVPB 2000 mg/400 mL        2,000 mg 200 mL/hr over 120 Minutes Intravenous  Once 04/19/24 0227 04/19/24 0502   04/19/24 0214  azithromycin  (ZITHROMAX ) tablet 500 mg  Status:  Discontinued        500 mg Oral Daily at bedtime 04/19/24 0215 04/20/24 0857   04/18/24 1900  azithromycin  (ZITHROMAX ) 500 mg in sodium chloride  0.9 % 250 mL IVPB        500  mg 250 mL/hr over 60 Minutes Intravenous  Once 04/18/24 1856 04/18/24 2042   04/18/24 1815  cefTRIAXone  (ROCEPHIN ) 2 g in sodium chloride  0.9 % 100 mL IVPB        2 g 200 mL/hr over 30 Minutes Intravenous  Once 04/18/24 1802 04/18/24 1919        I have personally reviewed the following labs and images: CBC: Recent Labs  Lab 04/18/24 1908 04/19/24 0317 04/19/24 1517 04/19/24 1518 04/20/24 0305 04/21/24 0542  WBC 4.2 7.1 6.0  --  4.5 3.8*  NEUTROABS 3.7 6.7  --   --   --   --   HGB 5.9* 6.0* 7.8* 7.8* 7.1* 7.0*  HCT 18.7* 19.9* 24.6* 25.2* 23.1* 22.8*  MCV 87.4 92.1 89.1  --  89.5 90.5  PLT 26* 30* 36*  --  30* 32*   BMP &GFR Recent Labs  Lab 04/18/24 1908 04/19/24 0317 04/20/24 0305 04/21/24 0542  NA 131* 131* 128* 131*  K 4.3 4.4 4.1 4.0  CL 98 102 99 99  CO2 21* 23 20* 22  GLUCOSE 275* 250* 229* 192*  BUN 40* 46* 52* 53*  CREATININE 2.21* 2.14* 2.35* 2.02*  CALCIUM  8.9 8.6* 8.3* 8.8*  MG  --  2.0 2.1 2.5*  PHOS  --  3.5 3.5  --    Estimated Creatinine Clearance: 37.5 mL/min (A) (by C-G formula based on SCr of 2.02 mg/dL (H)). Liver & Pancreas: Recent Labs  Lab 04/18/24 1908 04/19/24 0317 04/20/24 0305 04/21/24 0542  AST 30 27 26 29   ALT 18 19 19 19   ALKPHOS 82 69 63 62  BILITOT 2.7* 3.4* 3.3* 3.3*  PROT 6.1* 6.4* 6.3* 6.1*  ALBUMIN  3.3* 2.9* 2.7* 2.6*   No results for input(s): LIPASE, AMYLASE in the last 168 hours. Recent Labs  Lab 04/20/24 0305 04/21/24 0542  AMMONIA 103* 45*   Diabetic: No results for input(s): HGBA1C in the last 72 hours. Recent Labs  Lab 04/20/24 1145 04/20/24 1557 04/20/24 1713 04/20/24 2105 04/21/24 0757  GLUCAP 234* 240* 218* 219* 209*   Cardiac Enzymes: Recent Labs  Lab 04/21/24 0542  CKTOTAL 32*   Recent Labs    02/02/24 0900  PROBNP  1,655.0*   Coagulation Profile: Recent Labs  Lab 04/18/24 1909 04/19/24 0317 04/21/24 0542  INR 1.4* 1.4* 1.3*   Thyroid  Function Tests: Recent Labs     04/20/24 0305  TSH 0.817   Lipid Profile: No results for input(s): CHOL, HDL, LDLCALC, TRIG, CHOLHDL, LDLDIRECT in the last 72 hours. Anemia Panel: Recent Labs    04/19/24 0317  VITAMINB12 445  FOLATE 12.0  FERRITIN 31  TIBC 340  IRON 67  RETICCTPCT 5.0*   Urine analysis:    Component Value Date/Time   COLORURINE YELLOW 04/19/2024 0211   APPEARANCEUR HAZY (A) 04/19/2024 0211   LABSPEC 1.013 04/19/2024 0211   PHURINE 5.0 04/19/2024 0211   GLUCOSEU NEGATIVE 04/19/2024 0211   HGBUR NEGATIVE 04/19/2024 0211   BILIRUBINUR NEGATIVE 04/19/2024 0211   KETONESUR NEGATIVE 04/19/2024 0211   PROTEINUR NEGATIVE 04/19/2024 0211   UROBILINOGEN 2.0 (H) 01/31/2015 0055   NITRITE NEGATIVE 04/19/2024 0211   LEUKOCYTESUR NEGATIVE 04/19/2024 0211   Sepsis Labs: Invalid input(s): PROCALCITONIN, LACTICIDVEN  Microbiology: Recent Results (from the past 240 hours)  Culture, blood (routine x 2)     Status: Abnormal (Preliminary result)   Collection Time: 04/18/24  4:46 PM   Specimen: BLOOD RIGHT ARM  Result Value Ref Range Status   Specimen Description   Final    BLOOD RIGHT ARM Performed at [facility], [address], [place], [place] [address]    Special Requests   Final    BOTTLES DRAWN AEROBIC AND ANAEROBIC Blood Culture adequate volume Performed at [facility], [address] [place], [place] [address]    Culture  Setup Time   Final    GRAM POSITIVE COCCI IN CLUSTERS IN BOTH AEROBIC AND ANAEROBIC BOTTLES CRITICAL RESULT CALLED TO, READ BACK BY AND VERIFIED WITH: PHARMD N. GLOGOVAC [id number] @ 1650 FH    Culture (A)  Final    STAPHYLOCOCCUS EPIDERMIDIS SUSCEPTIBILITIES TO FOLLOW Performed at [facility], [address], [place], [place] [address]    Report Status PENDING  Incomplete  Blood Culture ID Panel (Reflexed)     Status: Abnormal   Collection Time: 04/18/24  4:46 PM  Result Value Ref Range Status   Enterococcus faecalis NOT  DETECTED NOT DETECTED Final   Enterococcus Faecium NOT DETECTED NOT DETECTED Final   Listeria monocytogenes NOT DETECTED NOT DETECTED Final   Staphylococcus species DETECTED (A) NOT DETECTED Final    Comment: CRITICAL RESULT CALLED TO, READ BACK BY AND VERIFIED WITH: PHARMD N. GLOGOVAC [id number] @ 1650 FH    Staphylococcus aureus (BCID) NOT DETECTED NOT DETECTED Final   Staphylococcus epidermidis DETECTED (A) NOT DETECTED Final    Comment: Methicillin (oxacillin) resistant coagulase negative staphylococcus. Possible blood culture contaminant (unless isolated from more than one blood culture draw or clinical case suggests pathogenicity). No antibiotic treatment is indicated for blood  culture contaminants. CRITICAL RESULT CALLED TO, READ BACK BY AND VERIFIED WITH: PHARMD N. GLOGOVAC [id number] @ 1650 FH    Staphylococcus lugdunensis NOT DETECTED NOT DETECTED Final   Streptococcus species NOT DETECTED NOT DETECTED Final   Streptococcus agalactiae NOT DETECTED NOT DETECTED Final   Streptococcus pneumoniae NOT DETECTED NOT DETECTED Final   Streptococcus pyogenes NOT DETECTED NOT DETECTED Final   A.calcoaceticus-baumannii NOT DETECTED NOT DETECTED Final   Bacteroides fragilis NOT DETECTED NOT DETECTED Final   Enterobacterales NOT DETECTED NOT DETECTED Final   Enterobacter cloacae complex NOT DETECTED NOT DETECTED Final   Escherichia coli NOT DETECTED  NOT DETECTED Final   Klebsiella aerogenes NOT DETECTED NOT DETECTED Final   Klebsiella oxytoca NOT DETECTED NOT DETECTED Final   Klebsiella pneumoniae NOT DETECTED NOT DETECTED Final   Proteus species NOT DETECTED NOT DETECTED Final   Salmonella species NOT DETECTED NOT DETECTED Final   Serratia marcescens NOT DETECTED NOT DETECTED Final   Haemophilus influenzae NOT DETECTED NOT DETECTED Final   Neisseria meningitidis NOT DETECTED NOT DETECTED Final   Pseudomonas aeruginosa NOT DETECTED NOT DETECTED Final   Stenotrophomonas maltophilia NOT DETECTED  NOT DETECTED Final   Candida albicans NOT DETECTED NOT DETECTED Final   Candida auris NOT DETECTED NOT DETECTED Final   Candida glabrata NOT DETECTED NOT DETECTED Final   Candida krusei NOT DETECTED NOT DETECTED Final   Candida parapsilosis NOT DETECTED NOT DETECTED Final   Candida tropicalis NOT DETECTED NOT DETECTED Final   Cryptococcus neoformans/gattii NOT DETECTED NOT DETECTED Final   Methicillin resistance mecA/C DETECTED (A) NOT DETECTED Final    Comment: CRITICAL RESULT CALLED TO, READ BACK BY AND VERIFIED WITH: PHARMD N. GLOGOVAC [id number] @ 1650 FH Performed at [facility], [address] [place], [place] [address]   Culture, blood (routine x 2)     Status: Abnormal (Preliminary result)   Collection Time: 04/18/24  4:51 PM   Specimen: BLOOD LEFT ARM  Result Value Ref Range Status   Specimen Description   Final    BLOOD LEFT ARM Performed at [facility], [address], [place], [place] [address]    Special Requests   Final    BOTTLES DRAWN AEROBIC AND ANAEROBIC Blood Culture adequate volume Performed at [facility], [address] [place], [place] [address]    Culture  Setup Time   Final    GRAM POSITIVE COCCI IN CLUSTERS IN BOTH AEROBIC AND ANAEROBIC BOTTLES CRITICAL RESULT CALLED TO, READ BACK BY AND VERIFIED WITH: PHARMD N. GLOGOVAC [id number] @ 1650 FH    Culture (A)  Final    STAPHYLOCOCCUS EPIDERMIDIS SUSCEPTIBILITIES TO FOLLOW Performed at [facility], [address], [place], [place] [address]    Report Status PENDING  Incomplete  Resp panel by RT-PCR (RSV, Flu A&B, Covid) Anterior Nasal Swab     Status: None   Collection Time: 04/19/24  1:22 AM   Specimen: Anterior Nasal Swab  Result Value Ref Range Status   SARS Coronavirus 2 by RT PCR NEGATIVE NEGATIVE Final    Comment: (NOTE) SARS-CoV-2 target nucleic acids are NOT DETECTED.  The SARS-CoV-2 RNA is generally detectable in upper respiratory specimens during the acute phase  of infection. The lowest concentration of SARS-CoV-2 viral copies this assay can detect is 138 copies/mL. A negative result does not preclude SARS-Cov-2 infection and should not be used as the sole basis for treatment or other patient management decisions. A negative result may occur with  improper specimen collection/handling, submission of specimen other than nasopharyngeal swab, presence of viral mutation(s) within the areas targeted by this assay, and inadequate number of viral copies(<138 copies/mL). A negative result must be combined with clinical observations, patient history, and epidemiological information. The expected result is Negative.  Fact Sheet for Patients:  BloggerCourse.com  Fact Sheet for Healthcare Providers:  SeriousBroker.it  This test is no t yet approved or cleared by the [place]  FDA and  has been authorized for detection and/or diagnosis of SARS-CoV-2 by FDA under an Emergency Use Authorization (EUA). This EUA will remain  in effect (  meaning this test can be used) for the duration of the COVID-19 declaration under Section 564(b)(1) of the Act, 21 U.S.C.section 360bbb-3(b)(1), unless the authorization is terminated  or revoked sooner.       Influenza A by PCR NEGATIVE NEGATIVE Final   Influenza B by PCR NEGATIVE NEGATIVE Final    Comment: (NOTE) The Xpert Xpress SARS-CoV-2/FLU/RSV plus assay is intended as an aid in the diagnosis of influenza from Nasopharyngeal swab specimens and should not be used as a sole basis for treatment. Nasal washings and aspirates are unacceptable for Xpert Xpress SARS-CoV-2/FLU/RSV testing.  Fact Sheet for Patients: BloggerCourse.com  Fact Sheet for Healthcare Providers: SeriousBroker.it  This test is not yet approved or cleared by the [place]  FDA and has been authorized for detection and/or diagnosis of  SARS-CoV-2 by FDA under an Emergency Use Authorization (EUA). This EUA will remain in effect (meaning this test can be used) for the duration of the COVID-19 declaration under Section 564(b)(1) of the Act, 21 U.S.C. section 360bbb-3(b)(1), unless the authorization is terminated or revoked.     Resp Syncytial Virus by PCR NEGATIVE NEGATIVE Final    Comment: (NOTE) Fact Sheet for Patients: BloggerCourse.com  Fact Sheet for Healthcare Providers: SeriousBroker.it  This test is not yet approved or cleared by the [place]  FDA and has been authorized for detection and/or diagnosis of SARS-CoV-2 by FDA under an Emergency Use Authorization (EUA). This EUA will remain in effect (meaning this test can be used) for the duration of the COVID-19 declaration under Section 564(b)(1) of the Act, 21 U.S.C. section 360bbb-3(b)(1), unless the authorization is terminated or revoked.  Performed at [facility], [address] [place], [place] [address]   MRSA Next Gen by PCR, Nasal     Status: None   Collection Time: 04/19/24  1:22 AM   Specimen: Anterior Nasal Swab  Result Value Ref Range Status   MRSA by PCR Next Gen NOT DETECTED NOT DETECTED Final    Comment: (NOTE) The GeneXpert MRSA Assay (FDA approved for NASAL specimens only), is one component of a comprehensive MRSA colonization surveillance program. It is not intended to diagnose MRSA infection nor to guide or monitor treatment for MRSA infections. Test performance is not FDA approved in patients less than 22 years old. Performed at [facility], [address], [place], [place] [address]   Respiratory (~20 pathogens) panel by PCR     Status: None   Collection Time: 04/19/24  9:00 AM   Specimen: Nasopharyngeal Swab; Respiratory  Result Value Ref Range Status   Adenovirus NOT DETECTED NOT DETECTED Final   Coronavirus 229E NOT DETECTED NOT  DETECTED Final    Comment: (NOTE) The Coronavirus on the Respiratory Panel, DOES NOT test for the novel  Coronavirus (2019 nCoV)    Coronavirus HKU1 NOT DETECTED NOT DETECTED Final   Coronavirus NL63 NOT DETECTED NOT DETECTED Final   Coronavirus OC43 NOT DETECTED NOT DETECTED Final   Metapneumovirus NOT DETECTED NOT DETECTED Final   Rhinovirus / Enterovirus NOT DETECTED NOT DETECTED Final   Influenza A NOT DETECTED NOT DETECTED Final   Influenza B NOT DETECTED NOT DETECTED Final   Parainfluenza Virus 1 NOT DETECTED NOT DETECTED Final   Parainfluenza Virus 2 NOT DETECTED NOT DETECTED Final   Parainfluenza Virus 3 NOT DETECTED NOT DETECTED Final   Parainfluenza Virus 4 NOT DETECTED NOT DETECTED Final   Respiratory Syncytial Virus NOT DETECTED NOT DETECTED Final   Bordetella pertussis NOT DETECTED  NOT DETECTED Final   Bordetella Parapertussis NOT DETECTED NOT DETECTED Final   Chlamydophila pneumoniae NOT DETECTED NOT DETECTED Final   Mycoplasma pneumoniae NOT DETECTED NOT DETECTED Final    Comment: Performed at [facility], [address], [place], [place] [address]    Radiology Studies: No results found.    Daris Aristizabal T. Nykerria Macconnell [facility]  If 7PM-7AM, please contact night-coverage [email or web address] 04/21/2024, 10:32 AM

## 2024-04-22 ENCOUNTER — Ambulatory Visit

## 2024-04-22 ENCOUNTER — Inpatient Hospital Stay (HOSPITAL_COMMUNITY)

## 2024-04-22 DIAGNOSIS — J181 Lobar pneumonia, unspecified organism: Secondary | ICD-10-CM

## 2024-04-22 DIAGNOSIS — R7881 Bacteremia: Secondary | ICD-10-CM

## 2024-04-22 DIAGNOSIS — R011 Cardiac murmur, unspecified: Secondary | ICD-10-CM

## 2024-04-22 DIAGNOSIS — L988 Other specified disorders of the skin and subcutaneous tissue: Secondary | ICD-10-CM

## 2024-04-22 DIAGNOSIS — L03116 Cellulitis of left lower limb: Secondary | ICD-10-CM

## 2024-04-22 DIAGNOSIS — B957 Other staphylococcus as the cause of diseases classified elsewhere: Secondary | ICD-10-CM | POA: Diagnosis not present

## 2024-04-22 LAB — GLUCOSE, CAPILLARY
Glucose-Capillary: 155 mg/dL — ABNORMAL HIGH (ref 70–99)
Glucose-Capillary: 190 mg/dL — ABNORMAL HIGH (ref 70–99)
Glucose-Capillary: 153 mg/dL — ABNORMAL HIGH (ref 70–99)
Glucose-Capillary: 226 mg/dL — ABNORMAL HIGH (ref 70–99)

## 2024-04-22 LAB — COMPREHENSIVE METABOLIC PANEL WITH GFR
ALT: 19 U/L (ref 0–44)
AST: 32 U/L (ref 15–41)
Albumin: 2.8 g/dL — ABNORMAL LOW (ref 3.5–5.0)
Alkaline Phosphatase: 71 U/L (ref 38–126)
Anion gap: 12 (ref 5–15)
BUN: 50 mg/dL — ABNORMAL HIGH (ref 8–23)
CO2: 21 mmol/L — ABNORMAL LOW (ref 22–32)
Calcium: 9.2 mg/dL (ref 8.9–10.3)
Chloride: 100 mmol/L (ref 98–111)
Creatinine, Ser: 1.67 mg/dL — ABNORMAL HIGH (ref 0.44–1.00)
GFR, Estimated: 33 mL/min — ABNORMAL LOW (ref >60)
Glucose, Bld: 174 mg/dL — ABNORMAL HIGH (ref 70–99)
Potassium: 3.9 mmol/L (ref 3.5–5.1)
Sodium: 133 mmol/L — ABNORMAL LOW (ref 135–145)
Total Bilirubin: 3.6 mg/dL — ABNORMAL HIGH (ref 0.0–1.2)
Total Protein: 6.5 g/dL (ref 6.5–8.1)

## 2024-04-22 LAB — MAGNESIUM: Magnesium: 2.5 mg/dL — ABNORMAL HIGH (ref 1.7–2.4)

## 2024-04-22 LAB — ECHOCARDIOGRAM COMPLETE
AV Mean grad: 20 mmHg
AV Peak grad: 40.4 mmHg
Ao pk vel: 3.18 m/s
Area-P 1/2: 3.44 cm2
Height: 67 in
S' Lateral: 3.7 cm
Weight: 4811.32 [oz_av]

## 2024-04-22 LAB — CBC
HCT: 27.3 % — ABNORMAL LOW (ref 36.0–46.0)
Hemoglobin: 8.4 g/dL — ABNORMAL LOW (ref 12.0–15.0)
MCH: 29 pg (ref 26.0–34.0)
MCHC: 30.8 g/dL (ref 30.0–36.0)
MCV: 94.1 fL (ref 80.0–100.0)
Platelets: 41 K/uL — ABNORMAL LOW (ref 150–400)
RBC: 2.9 MIL/uL — ABNORMAL LOW (ref 3.87–5.11)
RDW: 18.6 % — ABNORMAL HIGH (ref 11.5–15.5)
WBC: 4.8 K/uL (ref 4.0–10.5)
nRBC: 0 % (ref 0.0–0.2)

## 2024-04-22 MED ORDER — PERFLUTREN LIPID MICROSPHERE
1.0000 mL | INTRAVENOUS | Status: AC | PRN
  Administered 2024-04-22: 3 mL via INTRAVENOUS

## 2024-04-22 MED ORDER — DIPHENHYDRAMINE HCL 25 MG PO CAPS
25.0000 mg | ORAL_CAPSULE | Freq: Three times a day (TID) | ORAL | Status: DC | PRN
  Administered 2024-04-22: 25 mg via ORAL
  Filled 2024-04-22: qty 1

## 2024-04-22 MED ORDER — TORSEMIDE 20 MG PO TABS
80.0000 mg | ORAL_TABLET | Freq: Every day | ORAL | Status: DC
  Administered 2024-04-22 – 2024-04-23 (×2): 80 mg via ORAL
  Filled 2024-04-22 (×2): qty 4

## 2024-04-22 MED ORDER — TORSEMIDE 20 MG PO TABS
40.0000 mg | ORAL_TABLET | Freq: Every day | ORAL | Status: DC
  Administered 2024-04-23: 40 mg via ORAL
  Filled 2024-04-22: qty 2

## 2024-04-22 NOTE — Progress Notes (Signed)
   04/22/24 1345  Spiritual Encounters  Type of Visit Initial  Care provided to: Patient;Significant other  Reason for visit Routine spiritual support  OnCall Visit No  Spiritual Framework  Presenting Themes Meaning/purpose/sources of inspiration;Values and beliefs;Significant life change;Coping tools;Community and relationships;Goals in life/care;Rituals and practive;Impactful experiences and emotions;Courage hope and growth  Values/beliefs Christianity  Community/Connection Family;Friend(s);Faith community;Significant other  Strengths Resilient and determined; describes herself as a Multimedia programmer; doesn't feel as though she's improving health-wise  Patient Stress Factors Exhausted;Family relationships;Loss of control;Loss;Major life changes;Health changes  Interventions  Spiritual Care Interventions Made Established relationship of care and support;Compassionate presence;Reflective listening;Normalization of emotions;Reconciliation with self/others;Narrative/life review;Explored values/beliefs/practices/strengths;Meaning making;Prayer;Self-care teaching;Provided grief education  Intervention Outcomes  Outcomes Connection to spiritual care;Awareness around self/spiritual resourses;Connection to values and goals of care;Autonomy/agency;Awareness of support;Reduced anxiety;Reduced fear;Reduced isolation;Patient family open to resources    Chaplain responded to spiritual consult. Husband Hannah Khan bedside. Patient Hannah Khan was open to talking and exploring her emotions. 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 She states she is ready to go home to be with her deceased parents and grandparents. Chaplain provided compassionate presence, reflective listening and grief support/education. I also assured Jerel we are here for his support as well. We all prayed together, and [name] stated I  could come back anytime. She reports feeling lighter after being able to unload everything on her heart and mind. Chaplains remain available.

## 2024-04-22 NOTE — Progress Notes (Signed)
 PROGRESS NOTE  Hannah Khan FMW:[id number] DOB: 08-30-1956   PCP: Regino Slater, MD  Patient is from: Home.  DOA: 04/18/2024 LOS: 3  Chief complaints Chief Complaint  Patient presents with   Leg Swelling     Brief Narrative / Interim history: Patient is a 68 year old female, morbidly obese, with past medical history significant for Nash cirrhosis, EV bleed s/p BRTO, failed TIPS, HFpEF, A-fib not on anticoagulation, CKD-3B, DM-2, anemia, thrombocytopenia and OSA on CPAP.  Patient was sent to ED from PCP office after she presented there with left thigh rash, lightheadedness, dizziness and low blood pressure, and admitted with sepsis in the setting of left thigh cellulitis, possible left lung pneumonia and A-fib with RVR.  LLE venous Doppler negative for DVT.  Patient was started on Cardizem  drip for A-fib and broad-spectrum antibiotics for sepsis/cellulitis.   Blood cultures with Staph epidermidis in 4 out of 4 bottles.  ID, Dr. Fleeta dam consulted and recommended adding daptomycin  and doxycycline  pending culture sensitivity.  Repeat blood culture ordered.  04/22/2024: Repeat blood cultures are still pending.  Patient reports 17 pounds weight gain.  Will restart patient's home torsemide  80 Mg in the morning and 40 Mg around 3 PM.  Monitor renal function, electrolytes and volume status closely.  Subjective: -Patient reports weight gain. - Also reports some shortness of breath. - Patient was diuretics restarted.  Objective: Vitals:   04/21/24 2126 04/22/24 0348 04/22/24 0900 04/22/24 1457  BP: (!) 148/54 (!) 116/53 125/62 (!) 114/54  Pulse: 93 91 91 80  Resp: 20 20  20   Temp: 98.7 F (37.1 C) 97.9 F (36.6 C)  97.8 F (36.6 C)  TempSrc: Oral Oral  Oral  SpO2: 99% 99% 99% 97%  Weight:  (!) 136.4 kg    Height:        Examination:  GENERAL: No apparent distress.  Nontoxic.  Patient is morbidly obese. HEENT: Patient is pale. NECK: Supple.    RESP: No distress.   CVS:  S1-S2. ABD: Morbidly obese. Extremities: Bilateral lower extremity edema.  Lower extremity cellulitis seems to have resolved significantly.   NEURO: Awake and alert.  Consultants:  Infectious disease  Procedures: None  Microbiology summarized: COVID-19, influenza and RSV PCR nonreactive MRSA PCR screen negative. Blood cultures with staph epidermis in 4 out of 4 bottles  Assessment and plan: Severe sepsis due to LLE cellulitis and possible left lung pneumonia: Present on admission.  Febrile, tachycardic and tachypneic on presentation.  Lactic acid 2.6.  CXR with retrocardiac opacity but patient without respiratory symptoms.  Blood culture with staph epidermis in 4 out of 4 bottles.  MRSA PCR screen negative.  LLE venous Doppler negative for DVT.  Sepsis physiology resolving. - Ceftriaxone  7/31> Zosyn  8/1> cefepime  8/1> -Zithromax  7/31> doxycycline  and daptomycin  8/2>> per ID. - Repeat blood culture on 8/3 - Follow culture sensitivity 04/22/2024: Resolved significantly.  Persistent A-fib with RVR: RVR resolved.  Transitioned to p.o. metoprolol  and digoxin .  TSH normal..  -Increased metoprolol  to 50 mg twice daily on 8/2 -Continue digoxin  -Not on anticoagulation due to GI bleed -Optimize electrolytes  Acute on chronic blood loss anemia: denies overt bleeding.  H&H seems to be stable after 2 units Recent Labs    02/05/24 0204 02/06/24 0240 02/07/24 0212 04/18/24 1908 04/19/24 0317 04/19/24 1517 04/19/24 1518 04/20/24 0305 04/21/24 0542 04/22/24 0502  HGB 7.4* 7.3* 7.2* 5.9* 6.0* 7.8* 7.8* 7.1* 7.0* 8.4*  - Transfuse 1 unit. - Continue Protonix  - Appreciate input by  GI-outpatient EGD 04/22/2024: Hemoglobin of 8.4 g/dL today.  Continue to monitor closely.  NASH cirrhosis: Appears compensated.  Ammonia 42.  No encephalopathy. -MELD-Na score 25. -Continue lactulose  and rifaximin . -Continue holding diuretics due to AKI 04/22/2024: Resume diuretics (home dose).  Monitor renal  function, electrolytes and volume status closely.   AKI on CKD-3B: b/l Cr ~1.7-2.0.  AKI likely due to A-fib, anemia, vancomycin  and sepsis.  Improved. Recent Labs    02/03/24 0812 02/04/24 0227 02/05/24 0204 02/06/24 0240 02/07/24 0212 04/18/24 1908 04/19/24 0317 04/20/24 0305 04/21/24 0542 04/22/24 0502  BUN 34* 32* 27* 29* 29* 40* 46* 52* 53* 50*  CREATININE 2.11* 1.98* 1.93* 1.81* 1.75* 2.21* 2.14* 2.35* 2.02* 1.67*  - Continue holding diuretics - Avoid nephrotoxic meds - Recheck in the morning 04/22/2024: Serum creatinine of 1.67.  Resume diuretics.  IDDM-2 with hyperglycemia: A1c 6.0%. Recent Labs  Lab 04/21/24 1152 04/21/24 1629 04/21/24 2125 04/22/24 0730 04/22/24 1205  GLUCAP 227* 176* 188* 155* 190*  -Continue Semglee  24 units daily -Increase SSI to moderate -Increase NovoLog  from 3 to 5 units 3 times daily with meals  OSA on CPAP - Continue CPAP  Hyponatremia: Dilutional?  Improved. - Continue monitoring  Thrombocytopenia: Likely due to liver cirrhosis. - Continue monitoring  Morbid obesity Body mass index is 47.1 kg/m. - Encourage lifestyle change to lose weight          DVT prophylaxis:  SCDs Start: 04/19/24 0212  Code Status: Full code Family Communication: None at bedside Level of care: Progressive Status is: Inpatient Remains inpatient appropriate because: Severe sepsis, possible bacteremia, pneumonia, A-fib with RVR and AKI   Final disposition: Home   55 minutes with more than 50% spent in reviewing records, counseling patient/family and coordinating care.   Sch Meds:  Scheduled Meds:  sodium chloride    Intravenous Once   Chlorhexidine  Gluconate Cloth  6 each Topical QHS   digoxin   125 mcg Oral Daily   doxycycline   100 mg Oral Q12H   insulin  aspart  0-15 Units Subcutaneous TID WC   insulin  aspart  0-5 Units Subcutaneous QHS   insulin  aspart  3 Units Subcutaneous TID WC   insulin  glargine-yfgn  24 Units Subcutaneous Daily    lactulose   20 g Oral TID   metoprolol  tartrate  50 mg Oral BID   midodrine   10 mg Oral TID with meals   pantoprazole   40 mg Oral BID   rifaximin   550 mg Oral BID   sodium chloride  flush  3 mL Intravenous Q12H   Continuous Infusions:  DAPTOmycin  700 mg (04/22/24 1455)   famotidine  (PEPCID ) IV Stopped (04/22/24 1015)   PRN Meds:.albuterol , diphenhydrAMINE , lidocaine , melatonin, methocarbamol , ondansetron  (ZOFRAN ) IV, mouth rinse, oxyCODONE  **OR** oxyCODONE   Antimicrobials: Anti-infectives (From admission, onward)    Start     Dose/Rate Route Frequency Ordered Stop   04/21/24 0300  vancomycin  (VANCOREADY) IVPB 1500 mg/300 mL  Status:  Discontinued        1,500 mg 150 mL/hr over 120 Minutes Intravenous Every 48 hours 04/19/24 0229 04/19/24 0941   04/20/24 1000  doxycycline  (VIBRA -TABS) tablet 100 mg        100 mg Oral Every 12 hours 04/20/24 0857     04/20/24 1000  DAPTOmycin  (CUBICIN ) IVPB 700 mg/130mL premix        8 mg/kg  91.2 kg (Adjusted) 200 mL/hr over 30 Minutes Intravenous Daily 04/20/24 0859     04/19/24 1800  cefTRIAXone  (ROCEPHIN ) 2 g in sodium chloride  0.9 %  100 mL IVPB  Status:  Discontinued        2 g 200 mL/hr over 30 Minutes Intravenous Every 24 hours 04/19/24 0215 04/19/24 0942   04/19/24 1500  ceFEPIme  (MAXIPIME ) 2 g in sodium chloride  0.9 % 100 mL IVPB  Status:  Discontinued        2 g 200 mL/hr over 30 Minutes Intravenous Every 12 hours 04/19/24 1450 04/21/24 1647   04/19/24 1000  rifaximin  (XIFAXAN ) tablet 550 mg        550 mg Oral 2 times daily 04/19/24 0218     04/19/24 1000  piperacillin -tazobactam (ZOSYN ) IVPB 3.375 g  Status:  Discontinued        3.375 g 12.5 mL/hr over 240 Minutes Intravenous Every 8 hours 04/19/24 0942 04/19/24 1449   04/19/24 0315  vancomycin  (VANCOREADY) IVPB 2000 mg/400 mL        2,000 mg 200 mL/hr over 120 Minutes Intravenous  Once 04/19/24 0227 04/19/24 0502   04/19/24 0214  azithromycin  (ZITHROMAX ) tablet 500 mg  Status:   Discontinued        500 mg Oral Daily at bedtime 04/19/24 0215 04/20/24 0857   04/18/24 1900  azithromycin  (ZITHROMAX ) 500 mg in sodium chloride  0.9 % 250 mL IVPB        500 mg 250 mL/hr over 60 Minutes Intravenous  Once 04/18/24 1856 04/18/24 2042   04/18/24 1815  cefTRIAXone  (ROCEPHIN ) 2 g in sodium chloride  0.9 % 100 mL IVPB        2 g 200 mL/hr over 30 Minutes Intravenous  Once 04/18/24 1802 04/18/24 1919        I have personally reviewed the following labs and images: CBC: Recent Labs  Lab 04/18/24 1908 04/19/24 0317 04/19/24 1517 04/19/24 1518 04/20/24 0305 04/21/24 0542 04/22/24 0502  WBC 4.2 7.1 6.0  --  4.5 3.8* 4.8  NEUTROABS 3.7 6.7  --   --   --   --   --   HGB 5.9* 6.0* 7.8* 7.8* 7.1* 7.0* 8.4*  HCT 18.7* 19.9* 24.6* 25.2* 23.1* 22.8* 27.3*  MCV 87.4 92.1 89.1  --  89.5 90.5 94.1  PLT 26* 30* 36*  --  30* 32* 41*   BMP &GFR Recent Labs  Lab 04/18/24 1908 04/19/24 0317 04/20/24 0305 04/21/24 0542 04/22/24 0502  NA 131* 131* 128* 131* 133*  K 4.3 4.4 4.1 4.0 3.9  CL 98 102 99 99 100  CO2 21* 23 20* 22 21*  GLUCOSE 275* 250* 229* 192* 174*  BUN 40* 46* 52* 53* 50*  CREATININE 2.21* 2.14* 2.35* 2.02* 1.67*  CALCIUM  8.9 8.6* 8.3* 8.8* 9.2  MG  --  2.0 2.1 2.5* 2.5*  PHOS  --  3.5 3.5  --   --    Estimated Creatinine Clearance: 46.6 mL/min (A) (by C-G formula based on SCr of 1.67 mg/dL (H)). Liver & Pancreas: Recent Labs  Lab 04/18/24 1908 04/19/24 0317 04/20/24 0305 04/21/24 0542 04/22/24 0502  AST 30 27 26 29  32  ALT 18 19 19 19 19   ALKPHOS 82 69 63 62 71  BILITOT 2.7* 3.4* 3.3* 3.3* 3.6*  PROT 6.1* 6.4* 6.3* 6.1* 6.5  ALBUMIN  3.3* 2.9* 2.7* 2.6* 2.8*   No results for input(s): LIPASE, AMYLASE in the last 168 hours. Recent Labs  Lab 04/20/24 0305 04/21/24 0542  AMMONIA 103* 45*   Diabetic: No results for input(s): HGBA1C in the last 72 hours. Recent Labs  Lab 04/21/24 1152 04/21/24 1629 04/21/24  2125 04/22/24 0730  04/22/24 1205  GLUCAP 227* 176* 188* 155* 190*   Cardiac Enzymes: Recent Labs  Lab 04/21/24 0542  CKTOTAL 32*   Recent Labs    02/02/24 0900  PROBNP 1,655.0*   Coagulation Profile: Recent Labs  Lab 04/18/24 1909 04/19/24 0317 04/21/24 0542  INR 1.4* 1.4* 1.3*   Thyroid  Function Tests: Recent Labs    04/20/24 0305  TSH 0.817   Lipid Profile: No results for input(s): CHOL, HDL, LDLCALC, TRIG, CHOLHDL, LDLDIRECT in the last 72 hours. Anemia Panel: No results for input(s): VITAMINB12, FOLATE, FERRITIN, TIBC, IRON, RETICCTPCT in the last 72 hours.  Urine analysis:    Component Value Date/Time   COLORURINE YELLOW 04/19/2024 0211   APPEARANCEUR HAZY (A) 04/19/2024 0211   LABSPEC 1.013 04/19/2024 0211   PHURINE 5.0 04/19/2024 0211   GLUCOSEU NEGATIVE 04/19/2024 0211   HGBUR NEGATIVE 04/19/2024 0211   BILIRUBINUR NEGATIVE 04/19/2024 0211   KETONESUR NEGATIVE 04/19/2024 0211   PROTEINUR NEGATIVE 04/19/2024 0211   UROBILINOGEN 2.0 (H) 01/31/2015 0055   NITRITE NEGATIVE 04/19/2024 0211   LEUKOCYTESUR NEGATIVE 04/19/2024 0211   Sepsis Labs: Invalid input(s): PROCALCITONIN, LACTICIDVEN  Microbiology: Recent Results (from the past 240 hours)  Culture, blood (routine x 2)     Status: Abnormal   Collection Time: 04/18/24  4:46 PM   Specimen: BLOOD RIGHT ARM  Result Value Ref Range Status   Specimen Description   Final    BLOOD RIGHT ARM Performed at [facility], [address] [place], [place] [address]    Special Requests   Final    BOTTLES DRAWN AEROBIC AND ANAEROBIC Blood Culture adequate volume Performed at [facility], [address] [place], [place] [address]    Culture  Setup Time   Final    GRAM POSITIVE COCCI IN CLUSTERS IN BOTH AEROBIC AND ANAEROBIC BOTTLES CRITICAL RESULT CALLED TO, READ BACK BY AND VERIFIED WITH: PHARMD N. GLOGOVAC [id number] @ 1650 FH Performed at [facility], [address],  [place], [place] [address]    Culture STAPHYLOCOCCUS EPIDERMIDIS (A)  Final   Report Status 04/21/2024 FINAL  Final   Organism ID, Bacteria STAPHYLOCOCCUS EPIDERMIDIS  Final      Susceptibility   Staphylococcus epidermidis - MIC*    CIPROFLOXACIN <=0.5 SENSITIVE Sensitive     ERYTHROMYCIN >=8 RESISTANT Resistant     GENTAMICIN <=0.5 SENSITIVE Sensitive     OXACILLIN >=4 RESISTANT Resistant     TETRACYCLINE 2 SENSITIVE Sensitive     VANCOMYCIN  1 SENSITIVE Sensitive     TRIMETH/SULFA 20 SENSITIVE Sensitive     CLINDAMYCIN  >=8 RESISTANT Resistant     RIFAMPIN  8 RESISTANT Resistant     Inducible Clindamycin  NEGATIVE Sensitive     * STAPHYLOCOCCUS EPIDERMIDIS  Blood Culture ID Panel (Reflexed)     Status: Abnormal   Collection Time: 04/18/24  4:46 PM  Result Value Ref Range Status   Enterococcus faecalis NOT DETECTED NOT DETECTED Final   Enterococcus Faecium NOT DETECTED NOT DETECTED Final   Listeria monocytogenes NOT DETECTED NOT DETECTED Final   Staphylococcus species DETECTED (A) NOT DETECTED Final    Comment: CRITICAL RESULT CALLED TO, READ BACK BY AND VERIFIED WITH: PHARMD N. GLOGOVAC [id number] @ 1650 FH    Staphylococcus aureus (BCID) NOT DETECTED NOT DETECTED Final   Staphylococcus epidermidis DETECTED (A) NOT DETECTED Final    Comment: Methicillin (oxacillin) resistant coagulase negative staphylococcus. Possible blood culture contaminant (unless isolated from more  than one blood culture draw or clinical case suggests pathogenicity). No antibiotic treatment is indicated for blood  culture contaminants. CRITICAL RESULT CALLED TO, READ BACK BY AND VERIFIED WITH: PHARMD N. GLOGOVAC [id number] @ 1650 FH    Staphylococcus lugdunensis NOT DETECTED NOT DETECTED Final   Streptococcus species NOT DETECTED NOT DETECTED Final   Streptococcus agalactiae NOT DETECTED NOT DETECTED Final   Streptococcus pneumoniae NOT DETECTED NOT DETECTED Final   Streptococcus pyogenes NOT DETECTED NOT DETECTED Final    A.calcoaceticus-baumannii NOT DETECTED NOT DETECTED Final   Bacteroides fragilis NOT DETECTED NOT DETECTED Final   Enterobacterales NOT DETECTED NOT DETECTED Final   Enterobacter cloacae complex NOT DETECTED NOT DETECTED Final   Escherichia coli NOT DETECTED NOT DETECTED Final   Klebsiella aerogenes NOT DETECTED NOT DETECTED Final   Klebsiella oxytoca NOT DETECTED NOT DETECTED Final   Klebsiella pneumoniae NOT DETECTED NOT DETECTED Final   Proteus species NOT DETECTED NOT DETECTED Final   Salmonella species NOT DETECTED NOT DETECTED Final   Serratia marcescens NOT DETECTED NOT DETECTED Final   Haemophilus influenzae NOT DETECTED NOT DETECTED Final   Neisseria meningitidis NOT DETECTED NOT DETECTED Final   Pseudomonas aeruginosa NOT DETECTED NOT DETECTED Final   Stenotrophomonas maltophilia NOT DETECTED NOT DETECTED Final   Candida albicans NOT DETECTED NOT DETECTED Final   Candida auris NOT DETECTED NOT DETECTED Final   Candida glabrata NOT DETECTED NOT DETECTED Final   Candida krusei NOT DETECTED NOT DETECTED Final   Candida parapsilosis NOT DETECTED NOT DETECTED Final   Candida tropicalis NOT DETECTED NOT DETECTED Final   Cryptococcus neoformans/gattii NOT DETECTED NOT DETECTED Final   Methicillin resistance mecA/C DETECTED (A) NOT DETECTED Final    Comment: CRITICAL RESULT CALLED TO, READ BACK BY AND VERIFIED WITH: PHARMD N. GLOGOVAC [id number] @ 1650 FH Performed at [facility], [address] [place], [place] [address]   Culture, blood (routine x 2)     Status: Abnormal   Collection Time: 04/18/24  4:51 PM   Specimen: BLOOD LEFT ARM  Result Value Ref Range Status   Specimen Description   Final    BLOOD LEFT ARM Performed at [facility], [address] [place], [place] [address]    Special Requests   Final    BOTTLES DRAWN AEROBIC AND ANAEROBIC Blood Culture adequate volume Performed at [facility], [address] [place], [place] [address]     Culture  Setup Time   Final    GRAM POSITIVE COCCI IN CLUSTERS IN BOTH AEROBIC AND ANAEROBIC BOTTLES CRITICAL RESULT CALLED TO, READ BACK BY AND VERIFIED WITH: PHARMD N. GLOGOVAC [id number] @ 1650 FH Performed at [facility], [address], [place], [place] [address]    Culture STAPHYLOCOCCUS EPIDERMIDIS (A)  Final   Report Status 04/21/2024 FINAL  Final   Organism ID, Bacteria STAPHYLOCOCCUS EPIDERMIDIS  Final      Susceptibility   Staphylococcus epidermidis - MIC*    CIPROFLOXACIN <=0.5 SENSITIVE Sensitive     ERYTHROMYCIN >=8 RESISTANT Resistant     GENTAMICIN <=0.5 SENSITIVE Sensitive     OXACILLIN >=4 RESISTANT Resistant     TETRACYCLINE 2 SENSITIVE Sensitive     VANCOMYCIN  2 SENSITIVE Sensitive     TRIMETH/SULFA 20 SENSITIVE Sensitive     CLINDAMYCIN  >=8 RESISTANT Resistant     RIFAMPIN  16 RESISTANT Resistant     Inducible Clindamycin  NEGATIVE Sensitive     * STAPHYLOCOCCUS EPIDERMIDIS  Resp panel  by RT-PCR (RSV, Flu A&B, Covid) Anterior Nasal Swab     Status: None   Collection Time: 04/19/24  1:22 AM   Specimen: Anterior Nasal Swab  Result Value Ref Range Status   SARS Coronavirus 2 by RT PCR NEGATIVE NEGATIVE Final    Comment: (NOTE) SARS-CoV-2 target nucleic acids are NOT DETECTED.  The SARS-CoV-2 RNA is generally detectable in upper respiratory specimens during the acute phase of infection. The lowest concentration of SARS-CoV-2 viral copies this assay can detect is 138 copies/mL. A negative result does not preclude SARS-Cov-2 infection and should not be used as the sole basis for treatment or other patient management decisions. A negative result may occur with  improper specimen collection/handling, submission of specimen other than nasopharyngeal swab, presence of viral mutation(s) within the areas targeted by this assay, and inadequate number of viral copies(<138 copies/mL). A negative result must be combined with clinical observations, patient history, and  epidemiological information. The expected result is Negative.  Fact Sheet for Patients:  BloggerCourse.com  Fact Sheet for Healthcare Providers:  SeriousBroker.it  This test is no t yet approved or cleared by the [place]  FDA and  has been authorized for detection and/or diagnosis of SARS-CoV-2 by FDA under an Emergency Use Authorization (EUA). This EUA will remain  in effect (meaning this test can be used) for the duration of the COVID-19 declaration under Section 564(b)(1) of the Act, 21 U.S.C.section 360bbb-3(b)(1), unless the authorization is terminated  or revoked sooner.       Influenza A by PCR NEGATIVE NEGATIVE Final   Influenza B by PCR NEGATIVE NEGATIVE Final    Comment: (NOTE) The Xpert Xpress SARS-CoV-2/FLU/RSV plus assay is intended as an aid in the diagnosis of influenza from Nasopharyngeal swab specimens and should not be used as a sole basis for treatment. Nasal washings and aspirates are unacceptable for Xpert Xpress SARS-CoV-2/FLU/RSV testing.  Fact Sheet for Patients: BloggerCourse.com  Fact Sheet for Healthcare Providers: SeriousBroker.it  This test is not yet approved or cleared by the [place]  FDA and has been authorized for detection and/or diagnosis of SARS-CoV-2 by FDA under an Emergency Use Authorization (EUA). This EUA will remain in effect (meaning this test can be used) for the duration of the COVID-19 declaration under Section 564(b)(1) of the Act, 21 U.S.C. section 360bbb-3(b)(1), unless the authorization is terminated or revoked.     Resp Syncytial Virus by PCR NEGATIVE NEGATIVE Final    Comment: (NOTE) Fact Sheet for Patients: BloggerCourse.com  Fact Sheet for Healthcare Providers: SeriousBroker.it  This test is not yet approved or cleared by the [place]  FDA and has been  authorized for detection and/or diagnosis of SARS-CoV-2 by FDA under an Emergency Use Authorization (EUA). This EUA will remain in effect (meaning this test can be used) for the duration of the COVID-19 declaration under Section 564(b)(1) of the Act, 21 U.S.C. section 360bbb-3(b)(1), unless the authorization is terminated or revoked.  Performed at [facility], [address], [place], [place] [address]   MRSA Next Gen by PCR, Nasal     Status: None   Collection Time: 04/19/24  1:22 AM   Specimen: Anterior Nasal Swab  Result Value Ref Range Status   MRSA by PCR Next Gen NOT DETECTED NOT DETECTED Final    Comment: (NOTE) The GeneXpert MRSA Assay (FDA approved for NASAL specimens only), is one component of a comprehensive MRSA colonization surveillance program. It is not intended to diagnose MRSA infection nor to guide or  monitor treatment for MRSA infections. Test performance is not FDA approved in patients less than 63 years old. Performed at [facility], [address], [place], [place] [address]   Respiratory (~20 pathogens) panel by PCR     Status: None   Collection Time: 04/19/24  9:00 AM   Specimen: Nasopharyngeal Swab; Respiratory  Result Value Ref Range Status   Adenovirus NOT DETECTED NOT DETECTED Final   Coronavirus 229E NOT DETECTED NOT DETECTED Final    Comment: (NOTE) The Coronavirus on the Respiratory Panel, DOES NOT test for the novel  Coronavirus (2019 nCoV)    Coronavirus HKU1 NOT DETECTED NOT DETECTED Final   Coronavirus NL63 NOT DETECTED NOT DETECTED Final   Coronavirus OC43 NOT DETECTED NOT DETECTED Final   Metapneumovirus NOT DETECTED NOT DETECTED Final   Rhinovirus / Enterovirus NOT DETECTED NOT DETECTED Final   Influenza A NOT DETECTED NOT DETECTED Final   Influenza B NOT DETECTED NOT DETECTED Final   Parainfluenza Virus 1 NOT DETECTED NOT DETECTED Final   Parainfluenza Virus 2 NOT DETECTED NOT DETECTED Final    Parainfluenza Virus 3 NOT DETECTED NOT DETECTED Final   Parainfluenza Virus 4 NOT DETECTED NOT DETECTED Final   Respiratory Syncytial Virus NOT DETECTED NOT DETECTED Final   Bordetella pertussis NOT DETECTED NOT DETECTED Final   Bordetella Parapertussis NOT DETECTED NOT DETECTED Final   Chlamydophila pneumoniae NOT DETECTED NOT DETECTED Final   Mycoplasma pneumoniae NOT DETECTED NOT DETECTED Final    Comment: Performed at [facility], [address], [place], [place] [address]  Culture, blood (Routine X 2) w Reflex to ID Panel     Status: None (Preliminary result)   Collection Time: 04/21/24 12:57 PM   Specimen: BLOOD  Result Value Ref Range Status   Specimen Description   Final    BLOOD BLOOD LEFT HAND Performed at [facility], [address] [place], [place] [address]    Special Requests   Final    BOTTLES DRAWN AEROBIC ONLY Blood Culture results may not be optimal due to an inadequate volume of blood received in culture bottles Performed at [facility], [address] [place], [place] [address]    Culture   Final    NO GROWTH < 24 HOURS Performed at [facility], [address], [place], [place] [address]    Report Status PENDING  Incomplete  Culture, blood (Routine X 2) w Reflex to ID Panel     Status: None (Preliminary result)   Collection Time: 04/21/24 12:57 PM   Specimen: BLOOD  Result Value Ref Range Status   Specimen Description   Final    BLOOD BLOOD LEFT HAND Performed at [facility], [address] [place], [place] [address]    Special Requests   Final    BOTTLES DRAWN AEROBIC ONLY Blood Culture results may not be optimal due to an inadequate volume of blood received in culture bottles Performed at [facility], [address], [place], [place] [address]    Culture   Final    NO GROWTH < 24 HOURS Performed at [facility], [address], [place], [place]  [address]    Report Status PENDING  Incomplete    Radiology Studies: DG Chest 2 View Result Date: 04/21/2024 EXAM: 2 VIEW(S) XRAY OF THE CHEST 04/21/2024 05:51:00 PM COMPARISON: 04/18/2024 CLINICAL HISTORY: [id number] Pneumonia [id number]. Per order: pneumonia. Pt currently being treated for erythema  in her lower extremity and swelling. FINDINGS: LUNGS AND PLEURA: Basilar airspace opacities and small left pleural effusion. No pneumothorax. Pulmonary vascular congestion. HEART AND MEDIASTINUM: Stable cardiomegaly. Aortic atherosclerotic calcification. BONES AND SOFT TISSUES: No acute osseous abnormality. IMPRESSION: 1. Left basilar airspace opacities and small left pleural effusion. Findings are similar to Apr 18 2024 and remain suspicious for pneumonia. Electronically signed by: Norman Gatlin MD 04/21/2024 08:24 PM EDT RP Workstation: [id number]      Leatrice Chapel, MD [facility]  If 7PM-7AM, please contact night-coverage [email or web address] 04/22/2024, 4:05 PM

## 2024-04-22 NOTE — Progress Notes (Signed)
 NAME: Hannah Khan  DOB: 1955-11-17  MRN: [id number]  Date/Time: 04/22/2024 1:22 PM   Subjective:   ? Hannah Khan is a 68 y.o.female  with a history of NASH, cirrhosis, PHT, gastric varices, main portal vein chronic thrombosis, afib, DM, HTN, CHF, CKD, h/o panniculitis and cellulitis. Morbid obesity    I? Current Facility-Administered Medications  Medication Dose Route Frequency Provider Last Rate Last Admin   0.9 %  sodium chloride  infusion (Manually program via Guardrails IV Fluids)   Intravenous Once Gonfa, Taye T, MD       albuterol  (PROVENTIL ) (2.5 MG/3ML) 0.083% nebulizer solution 2.5 mg  2.5 mg Nebulization Q4H PRN Gonfa, Taye T, MD       Chlorhexidine  Gluconate Cloth 2 % PADS 6 each  6 each Topical QHS Gonfa, Taye T, MD   6 each at 04/19/24 2117   DAPTOmycin  (CUBICIN ) IVPB 700 mg/100mL premix  8 mg/kg (Adjusted) Intravenous Q1400 Gonfa, Taye T, MD   Stopped at 04/21/24 1643   digoxin  (LANOXIN ) tablet 125 mcg  125 mcg Oral Daily Gonfa, Taye T, MD   125 mcg at 04/22/24 0946   diphenhydrAMINE  (BENADRYL ) capsule 25 mg  25 mg Oral Q8H PRN Ogbata, Sylvester I, MD   25 mg at 04/22/24 1212   doxycycline  (VIBRA -TABS) tablet 100 mg  100 mg Oral Q12H Gonfa, Taye T, MD   100 mg at 04/22/24 0946   famotidine  (PEPCID ) IVPB 20 mg premix  20 mg Intravenous Q12H Gonfa, Taye T, MD   Stopped at 04/22/24 1015   insulin  aspart (novoLOG ) injection 0-15 Units  0-15 Units Subcutaneous TID WC Gonfa, Taye T, MD   3 Units at 04/22/24 1212   insulin  aspart (novoLOG ) injection 0-5 Units  0-5 Units Subcutaneous QHS Gonfa, Taye T, MD   2 Units at 04/20/24 2203   insulin  aspart (novoLOG ) injection 3 Units  3 Units Subcutaneous TID WC Gonfa, Taye T, MD   3 Units at 04/22/24 1213   insulin  glargine-yfgn (SEMGLEE ) injection 24 Units  24 Units Subcutaneous Daily Gonfa, Taye T, MD   24 Units at 04/22/24 1015   lactulose  (CHRONULAC ) 10 GM/15ML solution 20 g  20 g Oral TID Gonfa, Taye T, MD   20 g at 04/21/24 0930    lidocaine  (LIDODERM ) 5 % 2 patch  2 patch Transdermal Daily PRN Gonfa, Taye T, MD   2 patch at 04/19/24 0410   melatonin tablet 6 mg  6 mg Oral QHS PRN Gonfa, Taye T, MD       methocarbamol  (ROBAXIN ) tablet 500 mg  500 mg Oral Q8H PRN Gonfa, Taye T, MD   500 mg at 04/22/24 0750   metoprolol  tartrate (LOPRESSOR ) tablet 50 mg  50 mg Oral BID Gonfa, Taye T, MD   50 mg at 04/22/24 0946   midodrine  (PROAMATINE ) tablet 10 mg  10 mg Oral TID with meals Gonfa, Taye T, MD   10 mg at 04/22/24 1212   ondansetron  (ZOFRAN ) injection 4 mg  4 mg Intravenous Q6H PRN Gonfa, Taye T, MD       Oral care mouth rinse  15 mL Mouth Rinse PRN Gonfa, Taye T, MD       oxyCODONE  (Oxy IR/ROXICODONE ) immediate release tablet 2.5 mg  2.5 mg Oral Q6H PRN Gonfa, Taye T, MD   2.5 mg at 04/19/24 0409   Or   oxyCODONE  (Oxy IR/ROXICODONE ) immediate release tablet 5 mg  5 mg Oral Q6H PRN Gonfa, Taye T, MD  5 mg at 04/19/24 0410   pantoprazole  (PROTONIX ) EC tablet 40 mg  40 mg Oral BID Gonfa, Taye T, MD   40 mg at 04/22/24 0946   perflutren  lipid microspheres (DEFINITY ) IV suspension  1-10 mL Intravenous PRN Fayette Bodily, MD   3 mL at 04/22/24 1320   rifaximin  (XIFAXAN ) tablet 550 mg  550 mg Oral BID Gonfa, Taye T, MD   550 mg at 04/22/24 0946   sodium chloride  flush (NS) 0.9 % injection 3 mL  3 mL Intravenous Q12H Gonfa, Taye T, MD   3 mL at 04/22/24 0946     Abtx:  Anti-infectives (From admission, onward)    Start     Dose/Rate Route Frequency Ordered Stop   04/21/24 0300  vancomycin  (VANCOREADY) IVPB 1500 mg/300 mL  Status:  Discontinued        1,500 mg 150 mL/hr over 120 Minutes Intravenous Every 48 hours 04/19/24 0229 04/19/24 0941   04/20/24 1000  doxycycline  (VIBRA -TABS) tablet 100 mg        100 mg Oral Every 12 hours 04/20/24 0857     04/20/24 1000  DAPTOmycin  (CUBICIN ) IVPB 700 mg/184mL premix        8 mg/kg  91.2 kg (Adjusted) 200 mL/hr over 30 Minutes Intravenous Daily 04/20/24 0859     04/19/24 1800   cefTRIAXone  (ROCEPHIN ) 2 g in sodium chloride  0.9 % 100 mL IVPB  Status:  Discontinued        2 g 200 mL/hr over 30 Minutes Intravenous Every 24 hours 04/19/24 0215 04/19/24 0942   04/19/24 1500  ceFEPIme  (MAXIPIME ) 2 g in sodium chloride  0.9 % 100 mL IVPB  Status:  Discontinued        2 g 200 mL/hr over 30 Minutes Intravenous Every 12 hours 04/19/24 1450 04/21/24 1647   04/19/24 1000  rifaximin  (XIFAXAN ) tablet 550 mg        550 mg Oral 2 times daily 04/19/24 0218     04/19/24 1000  piperacillin -tazobactam (ZOSYN ) IVPB 3.375 g  Status:  Discontinued        3.375 g 12.5 mL/hr over 240 Minutes Intravenous Every 8 hours 04/19/24 0942 04/19/24 1449   04/19/24 0315  vancomycin  (VANCOREADY) IVPB 2000 mg/400 mL        2,000 mg 200 mL/hr over 120 Minutes Intravenous  Once 04/19/24 0227 04/19/24 0502   04/19/24 0214  azithromycin  (ZITHROMAX ) tablet 500 mg  Status:  Discontinued        500 mg Oral Daily at bedtime 04/19/24 0215 04/20/24 0857   04/18/24 1900  azithromycin  (ZITHROMAX ) 500 mg in sodium chloride  0.9 % 250 mL IVPB        500 mg 250 mL/hr over 60 Minutes Intravenous  Once 04/18/24 1856 04/18/24 2042   04/18/24 1815  cefTRIAXone  (ROCEPHIN ) 2 g in sodium chloride  0.9 % 100 mL IVPB        2 g 200 mL/hr over 30 Minutes Intravenous  Once 04/18/24 1802 04/18/24 1919       REVIEW OF SYSTEMS:  Weakness Sob Cough Chills Redness, pain left thigh much improved Says she is a picker- Multiple excoriations skin  Objective:  VITALS:  BP 125/62 (BP Location: Left Arm)   Pulse 91   Temp 97.9 F (36.6 C) (Oral)   Resp 20   Ht 5' 7 (1.702 m)   Wt (!) 136.4 kg   SpO2 99%   BMI 47.10 kg/m   PHYSICAL EXAM:  General: Alert, cooperative, increased BMI  some shortness of breath .  Head: Normocephalic, without obvious abnormality, atraumatic. Eyes: Conjunctivae clear, anicteric sclerae. Pupils are equal ENT Nares normal. No drainage or sinus tenderness. Lips, mucosa, and tongue normal.  No Thrush Neck: Supple, symmetrical, no adenopathy, thyroid : non tender no carotid bruit and no JVD. Back: No CVA tenderness. Lungs: b/l air entry. Heart: 4/6 systolic murmur. Abdomen: Soft,pannus inmnduration on the rt side- no erythema or tenderness. Bowel sounds normal. No masses Extremities: b/l lymphedema     Skin: multiple excoriations scattered over lower extremities rt shoulder arms Lymph: Cervical, supraclavicular normal. Neurologic: Grossly non-focal Pertinent Labs Lab Results CBC    Component Value Date/Time   WBC 4.8 04/22/2024 0502   RBC 2.90 (L) 04/22/2024 0502   HGB 8.4 (L) 04/22/2024 0502   HGB 14.5 12/12/2022 0942   HGB 12.6 08/25/2017 1418   HCT 27.3 (L) 04/22/2024 0502   HCT 42.2 12/12/2022 0942   HCT 37.3 08/25/2017 1418   PLT 41 (L) 04/22/2024 0502   PLT Comment 12/12/2022 0942   MCV 94.1 04/22/2024 0502   MCV 93 12/12/2022 0942   MCV 91.2 08/25/2017 1418   MCH 29.0 04/22/2024 0502   MCHC 30.8 04/22/2024 0502   RDW 18.6 (H) 04/22/2024 0502   RDW 14.7 12/12/2022 0942   RDW 14.8 (H) 08/25/2017 1418   LYMPHSABS 0.2 (L) 04/19/2024 0317   LYMPHSABS 0.8 11/16/2021 1111   LYMPHSABS 0.7 (L) 08/25/2017 1418   MONOABS 0.1 04/19/2024 0317   MONOABS 0.3 08/25/2017 1418   EOSABS 0.0 04/19/2024 0317   EOSABS 0.1 11/16/2021 1111   BASOSABS 0.0 04/19/2024 0317   BASOSABS 0.0 11/16/2021 1111   BASOSABS 0.0 08/25/2017 1418       Latest Ref Rng & Units 04/22/2024    5:02 AM 04/21/2024    5:42 AM 04/20/2024    3:05 AM  CMP  Glucose 70 - 99 mg/dL 825  807  770   BUN 8 - 23 mg/dL 50  53  52   Creatinine 0.44 - 1.00 mg/dL 8.32  7.97  7.64   Sodium 135 - 145 mmol/L 133  131  128   Potassium 3.5 - 5.1 mmol/L 3.9  4.0  4.1   Chloride 98 - 111 mmol/L 100  99  99   CO2 22 - 32 mmol/L 21  22  20    Calcium  8.9 - 10.3 mg/dL 9.2  8.8  8.3   Total Protein 6.5 - 8.1 g/dL 6.5  6.1  6.3   Total Bilirubin 0.0 - 1.2 mg/dL 3.6  3.3  3.3   Alkaline Phos 38 - 126 U/L 71  62   63   AST 15 - 41 U/L 32  29  26   ALT 0 - 44 U/L 19  19  19        Microbiology: Recent Results (from the past 240 hours)  Culture, blood (routine x 2)     Status: Abnormal   Collection Time: 04/18/24  4:46 PM   Specimen: BLOOD RIGHT ARM  Result Value Ref Range Status   Specimen Description   Final    BLOOD RIGHT ARM Performed at [facility], [address] [place], [place] [address]    Special Requests   Final    BOTTLES DRAWN AEROBIC AND ANAEROBIC Blood Culture adequate volume Performed at [facility], [address] [place], [place] [address]    Culture  Setup Time   Final    GRAM POSITIVE  COCCI IN CLUSTERS IN BOTH AEROBIC AND ANAEROBIC BOTTLES CRITICAL RESULT CALLED TO, READ BACK BY AND VERIFIED WITH: PHARMD N. GLOGOVAC [id number] @ 1650 FH Performed at [facility], [address], [place], [place] [address]    Culture STAPHYLOCOCCUS EPIDERMIDIS (A)  Final   Report Status 04/21/2024 FINAL  Final   Organism ID, Bacteria STAPHYLOCOCCUS EPIDERMIDIS  Final      Susceptibility   Staphylococcus epidermidis - MIC*    CIPROFLOXACIN <=0.5 SENSITIVE Sensitive     ERYTHROMYCIN >=8 RESISTANT Resistant     GENTAMICIN <=0.5 SENSITIVE Sensitive     OXACILLIN >=4 RESISTANT Resistant     TETRACYCLINE 2 SENSITIVE Sensitive     VANCOMYCIN  1 SENSITIVE Sensitive     TRIMETH/SULFA 20 SENSITIVE Sensitive     CLINDAMYCIN  >=8 RESISTANT Resistant     RIFAMPIN  8 RESISTANT Resistant     Inducible Clindamycin  NEGATIVE Sensitive     * STAPHYLOCOCCUS EPIDERMIDIS  Blood Culture ID Panel (Reflexed)     Status: Abnormal   Collection Time: 04/18/24  4:46 PM  Result Value Ref Range Status   Enterococcus faecalis NOT DETECTED NOT DETECTED Final   Enterococcus Faecium NOT DETECTED NOT DETECTED Final   Listeria monocytogenes NOT DETECTED NOT DETECTED Final   Staphylococcus species DETECTED (A) NOT DETECTED Final    Comment: CRITICAL RESULT CALLED TO, READ BACK BY AND VERIFIED  WITH: PHARMD N. GLOGOVAC [id number] @ 1650 FH    Staphylococcus aureus (BCID) NOT DETECTED NOT DETECTED Final   Staphylococcus epidermidis DETECTED (A) NOT DETECTED Final    Comment: Methicillin (oxacillin) resistant coagulase negative staphylococcus. Possible blood culture contaminant (unless isolated from more than one blood culture draw or clinical case suggests pathogenicity). No antibiotic treatment is indicated for blood  culture contaminants. CRITICAL RESULT CALLED TO, READ BACK BY AND VERIFIED WITH: PHARMD N. GLOGOVAC [id number] @ 1650 FH    Staphylococcus lugdunensis NOT DETECTED NOT DETECTED Final   Streptococcus species NOT DETECTED NOT DETECTED Final   Streptococcus agalactiae NOT DETECTED NOT DETECTED Final   Streptococcus pneumoniae NOT DETECTED NOT DETECTED Final   Streptococcus pyogenes NOT DETECTED NOT DETECTED Final   A.calcoaceticus-baumannii NOT DETECTED NOT DETECTED Final   Bacteroides fragilis NOT DETECTED NOT DETECTED Final   Enterobacterales NOT DETECTED NOT DETECTED Final   Enterobacter cloacae complex NOT DETECTED NOT DETECTED Final   Escherichia coli NOT DETECTED NOT DETECTED Final   Klebsiella aerogenes NOT DETECTED NOT DETECTED Final   Klebsiella oxytoca NOT DETECTED NOT DETECTED Final   Klebsiella pneumoniae NOT DETECTED NOT DETECTED Final   Proteus species NOT DETECTED NOT DETECTED Final   Salmonella species NOT DETECTED NOT DETECTED Final   Serratia marcescens NOT DETECTED NOT DETECTED Final   Haemophilus influenzae NOT DETECTED NOT DETECTED Final   Neisseria meningitidis NOT DETECTED NOT DETECTED Final   Pseudomonas aeruginosa NOT DETECTED NOT DETECTED Final   Stenotrophomonas maltophilia NOT DETECTED NOT DETECTED Final   Candida albicans NOT DETECTED NOT DETECTED Final   Candida auris NOT DETECTED NOT DETECTED Final   Candida glabrata NOT DETECTED NOT DETECTED Final   Candida krusei NOT DETECTED NOT DETECTED Final   Candida parapsilosis NOT DETECTED NOT  DETECTED Final   Candida tropicalis NOT DETECTED NOT DETECTED Final   Cryptococcus neoformans/gattii NOT DETECTED NOT DETECTED Final   Methicillin resistance mecA/C DETECTED (A) NOT DETECTED Final    Comment: CRITICAL RESULT CALLED TO, READ BACK BY AND VERIFIED WITH: PHARMD N. GLOGOVAC [id number] @ 1650 FH Performed at [facility], [address], [place], [place] [address]   Culture, blood (routine x 2)     Status: Abnormal   Collection Time: 04/18/24  4:51 PM   Specimen: BLOOD LEFT ARM  Result Value Ref Range Status   Specimen Description   Final    BLOOD LEFT ARM Performed at [facility], [address] [place], [place] [address]    Special Requests   Final    BOTTLES DRAWN AEROBIC AND ANAEROBIC Blood Culture adequate volume Performed at [facility], [address] [place], [place] [address]    Culture  Setup Time   Final    GRAM POSITIVE COCCI IN CLUSTERS IN BOTH AEROBIC AND ANAEROBIC BOTTLES CRITICAL RESULT CALLED TO, READ BACK BY AND VERIFIED WITH: PHARMD N. GLOGOVAC [id number] @ 1650 FH Performed at [facility], [address] [place], [place] [address]    Culture STAPHYLOCOCCUS EPIDERMIDIS (A)  Final   Report Status 04/21/2024 FINAL  Final   Organism ID, Bacteria STAPHYLOCOCCUS EPIDERMIDIS  Final      Susceptibility   Staphylococcus epidermidis - MIC*    CIPROFLOXACIN <=0.5 SENSITIVE Sensitive     ERYTHROMYCIN >=8 RESISTANT Resistant     GENTAMICIN <=0.5 SENSITIVE Sensitive     OXACILLIN >=4 RESISTANT Resistant     TETRACYCLINE 2 SENSITIVE Sensitive     VANCOMYCIN  2 SENSITIVE Sensitive     TRIMETH/SULFA 20 SENSITIVE Sensitive     CLINDAMYCIN  >=8 RESISTANT Resistant     RIFAMPIN  16 RESISTANT Resistant     Inducible Clindamycin  NEGATIVE Sensitive     * STAPHYLOCOCCUS EPIDERMIDIS  Resp panel by RT-PCR (RSV, Flu A&B, Covid) Anterior Nasal Swab     Status: None   Collection Time: 04/19/24  1:22 AM   Specimen: Anterior Nasal Swab  Result  Value Ref Range Status   SARS Coronavirus 2 by RT PCR NEGATIVE NEGATIVE Final    Comment: (NOTE) SARS-CoV-2 target nucleic acids are NOT DETECTED.  The SARS-CoV-2 RNA is generally detectable in upper respiratory specimens during the acute phase of infection. The lowest concentration of SARS-CoV-2 viral copies this assay can detect is 138 copies/mL. A negative result does not preclude SARS-Cov-2 infection and should not be used as the sole basis for treatment or other patient management decisions. A negative result may occur with  improper specimen collection/handling, submission of specimen other than nasopharyngeal swab, presence of viral mutation(s) within the areas targeted by this assay, and inadequate number of viral copies(<138 copies/mL). A negative result must be combined with clinical observations, patient history, and epidemiological information. The expected result is Negative.  Fact Sheet for Patients:  BloggerCourse.com  Fact Sheet for Healthcare Providers:  SeriousBroker.it  This test is no t yet approved or cleared by the [place]  FDA and  has been authorized for detection and/or diagnosis of SARS-CoV-2 by FDA under an Emergency Use Authorization (EUA). This EUA will remain  in effect (meaning this test can be used) for the duration of the COVID-19 declaration under Section 564(b)(1) of the Act, 21 U.S.C.section 360bbb-3(b)(1), unless the authorization is terminated  or revoked sooner.       Influenza A by PCR NEGATIVE NEGATIVE Final   Influenza B by PCR NEGATIVE NEGATIVE Final    Comment: (NOTE) The Xpert Xpress SARS-CoV-2/FLU/RSV plus assay is intended as an aid in the diagnosis of influenza from Nasopharyngeal swab specimens and should not be used as a sole basis for treatment. Nasal washings and  aspirates are unacceptable for Xpert Xpress SARS-CoV-2/FLU/RSV testing.  Fact Sheet for  Patients: BloggerCourse.com  Fact Sheet for Healthcare Providers: SeriousBroker.it  This test is not yet approved or cleared by the [place]  FDA and has been authorized for detection and/or diagnosis of SARS-CoV-2 by FDA under an Emergency Use Authorization (EUA). This EUA will remain in effect (meaning this test can be used) for the duration of the COVID-19 declaration under Section 564(b)(1) of the Act, 21 U.S.C. section 360bbb-3(b)(1), unless the authorization is terminated or revoked.     Resp Syncytial Virus by PCR NEGATIVE NEGATIVE Final    Comment: (NOTE) Fact Sheet for Patients: BloggerCourse.com  Fact Sheet for Healthcare Providers: SeriousBroker.it  This test is not yet approved or cleared by the [place]  FDA and has been authorized for detection and/or diagnosis of SARS-CoV-2 by FDA under an Emergency Use Authorization (EUA). This EUA will remain in effect (meaning this test can be used) for the duration of the COVID-19 declaration under Section 564(b)(1) of the Act, 21 U.S.C. section 360bbb-3(b)(1), unless the authorization is terminated or revoked.  Performed at [facility], [address], [place], [place] [address]   MRSA Next Gen by PCR, Nasal     Status: None   Collection Time: 04/19/24  1:22 AM   Specimen: Anterior Nasal Swab  Result Value Ref Range Status   MRSA by PCR Next Gen NOT DETECTED NOT DETECTED Final    Comment: (NOTE) The GeneXpert MRSA Assay (FDA approved for NASAL specimens only), is one component of a comprehensive MRSA colonization surveillance program. It is not intended to diagnose MRSA infection nor to guide or monitor treatment for MRSA infections. Test performance is not FDA approved in patients less than 39 years old. Performed at [facility], [address], [place], [place] [address]    Respiratory (~20 pathogens) panel by PCR     Status: None   Collection Time: 04/19/24  9:00 AM   Specimen: Nasopharyngeal Swab; Respiratory  Result Value Ref Range Status   Adenovirus NOT DETECTED NOT DETECTED Final   Coronavirus 229E NOT DETECTED NOT DETECTED Final    Comment: (NOTE) The Coronavirus on the Respiratory Panel, DOES NOT test for the novel  Coronavirus (2019 nCoV)    Coronavirus HKU1 NOT DETECTED NOT DETECTED Final   Coronavirus NL63 NOT DETECTED NOT DETECTED Final   Coronavirus OC43 NOT DETECTED NOT DETECTED Final   Metapneumovirus NOT DETECTED NOT DETECTED Final   Rhinovirus / Enterovirus NOT DETECTED NOT DETECTED Final   Influenza A NOT DETECTED NOT DETECTED Final   Influenza B NOT DETECTED NOT DETECTED Final   Parainfluenza Virus 1 NOT DETECTED NOT DETECTED Final   Parainfluenza Virus 2 NOT DETECTED NOT DETECTED Final   Parainfluenza Virus 3 NOT DETECTED NOT DETECTED Final   Parainfluenza Virus 4 NOT DETECTED NOT DETECTED Final   Respiratory Syncytial Virus NOT DETECTED NOT DETECTED Final   Bordetella pertussis NOT DETECTED NOT DETECTED Final   Bordetella Parapertussis NOT DETECTED NOT DETECTED Final   Chlamydophila pneumoniae NOT DETECTED NOT DETECTED Final   Mycoplasma pneumoniae NOT DETECTED NOT DETECTED Final    Comment: Performed at [facility], [address], [place], [place] [address]  Culture, blood (Routine X 2) w Reflex to ID Panel     Status: None (Preliminary result)   Collection Time: 04/21/24 12:57 PM   Specimen: BLOOD  Result Value Ref Range Status   Specimen Description   Final  BLOOD BLOOD LEFT HAND Performed at [facility], [address] [place], [place] [address]    Special Requests   Final    BOTTLES DRAWN AEROBIC ONLY Blood Culture results may not be optimal due to an inadequate volume of blood received in culture bottles Performed at [facility], [address], [place], [place]  [address]    Culture   Final    NO GROWTH < 24 HOURS Performed at [facility], [address], [place], [place] [address]    Report Status PENDING  Incomplete  Culture, blood (Routine X 2) w Reflex to ID Panel     Status: None (Preliminary result)   Collection Time: 04/21/24 12:57 PM   Specimen: BLOOD  Result Value Ref Range Status   Specimen Description   Final    BLOOD BLOOD LEFT HAND Performed at [facility], [address] [place], [place] [address]    Special Requests   Final    BOTTLES DRAWN AEROBIC ONLY Blood Culture results may not be optimal due to an inadequate volume of blood received in culture bottles Performed at [facility], [address] [place], [place] [address]    Culture   Final    NO GROWTH < 24 HOURS Performed at [facility], [address] [place], [place] [address]    Report Status PENDING  Incomplete    Lines and Device Date on insertion # of days DC  Central line     Foley     ETT       IMAGING RESULTS: CXR left basilar opacity I have personally reviewed the films ? Impression/Recommendation ?Staphylococcal epidermidis bacteremia- likely source skin- from the left leg cellulitis which she says was presnt on admisison and now much better To the muliple excoriations on her skin can be the source of bacteremia  She has a chronic systolic murmur but louder now? but the risk for endocarditis is less as she has no prosthetic valve or cardiac device So will get 2 de cho Will treat for 2 weeks with antibiotic and can repeat blood culture off antibiotics- continue daptomycin  Will discuss with her cardiologist  Possible LLL pneumonia- on doxy  Afib  NASH with cirrhosis , varices  Severe thrombocytopenia  Morbid obesity OSA ? ?Discussed the management with the patient

## 2024-04-23 DIAGNOSIS — R7881 Bacteremia: Secondary | ICD-10-CM | POA: Diagnosis not present

## 2024-04-23 DIAGNOSIS — B957 Other staphylococcus as the cause of diseases classified elsewhere: Secondary | ICD-10-CM | POA: Diagnosis not present

## 2024-04-23 LAB — CBC WITH DIFFERENTIAL/PLATELET
Abs Granulocyte: 3.4 K/uL (ref 1.5–6.5)
Abs Immature Granulocytes: 0.06 K/uL (ref 0.00–0.07)
Basophils Absolute: 0 K/uL (ref 0.0–0.1)
Basophils Relative: 1 %
Eosinophils Absolute: 0.1 K/uL (ref 0.0–0.5)
Eosinophils Relative: 3 %
HCT: 27.2 % — ABNORMAL LOW (ref 36.0–46.0)
Hemoglobin: 8 g/dL — ABNORMAL LOW (ref 12.0–15.0)
Immature Granulocytes: 1 %
Lymphocytes Relative: 10 %
Lymphs Abs: 0.5 K/uL — ABNORMAL LOW (ref 0.7–4.0)
MCH: 27.6 pg (ref 26.0–34.0)
MCHC: 29.4 g/dL — ABNORMAL LOW (ref 30.0–36.0)
MCV: 93.8 fL (ref 80.0–100.0)
Monocytes Absolute: 0.8 K/uL (ref 0.1–1.0)
Monocytes Relative: 16 %
Neutro Abs: 3.4 K/uL (ref 1.7–7.7)
Neutrophils Relative %: 69 %
Platelets: 37 K/uL — ABNORMAL LOW (ref 150–400)
RBC: 2.9 MIL/uL — ABNORMAL LOW (ref 3.87–5.11)
RDW: 19.1 % — ABNORMAL HIGH (ref 11.5–15.5)
WBC: 4.9 K/uL (ref 4.0–10.5)
nRBC: 0 % (ref 0.0–0.2)

## 2024-04-23 LAB — BPAM RBC
Blood Product Expiration Date: 202508252359
Blood Product Expiration Date: 202509022359
Blood Product Expiration Date: 202509032359
Blood Product Expiration Date: 202509032359
ISSUE DATE / TIME: 202508010545
ISSUE DATE / TIME: 202508010943
ISSUE DATE / TIME: 202508031254
Unit Type and Rh: 5100
Unit Type and Rh: 5100
Unit Type and Rh: 5100
Unit Type and Rh: 5100

## 2024-04-23 LAB — TYPE AND SCREEN
ABO/RH(D): O POS
Antibody Screen: NEGATIVE
Unit division: 0
Unit division: 0
Unit division: 0
Unit division: 0

## 2024-04-23 LAB — GLUCOSE, CAPILLARY
Glucose-Capillary: 148 mg/dL — ABNORMAL HIGH (ref 70–99)
Glucose-Capillary: 158 mg/dL — ABNORMAL HIGH (ref 70–99)
Glucose-Capillary: 123 mg/dL — ABNORMAL HIGH (ref 70–99)
Glucose-Capillary: 214 mg/dL — ABNORMAL HIGH (ref 70–99)

## 2024-04-23 LAB — RENAL FUNCTION PANEL
Albumin: 2.7 g/dL — ABNORMAL LOW (ref 3.5–5.0)
Anion gap: 11 (ref 5–15)
BUN: 47 mg/dL — ABNORMAL HIGH (ref 8–23)
CO2: 22 mmol/L (ref 22–32)
Calcium: 9 mg/dL (ref 8.9–10.3)
Chloride: 101 mmol/L (ref 98–111)
Creatinine, Ser: 1.52 mg/dL — ABNORMAL HIGH (ref 0.44–1.00)
GFR, Estimated: 37 mL/min — ABNORMAL LOW (ref >60)
Glucose, Bld: 156 mg/dL — ABNORMAL HIGH (ref 70–99)
Phosphorus: 2.6 mg/dL (ref 2.5–4.6)
Potassium: 3.7 mmol/L (ref 3.5–5.1)
Sodium: 134 mmol/L — ABNORMAL LOW (ref 135–145)

## 2024-04-23 MED ORDER — TORSEMIDE 20 MG PO TABS
80.0000 mg | ORAL_TABLET | Freq: Every day | ORAL | Status: DC
  Administered 2024-04-24: 80 mg via ORAL
  Filled 2024-04-23: qty 4

## 2024-04-23 MED ORDER — MELATONIN 5 MG PO TABS
5.0000 mg | ORAL_TABLET | Freq: Every day | ORAL | Status: DC
  Administered 2024-04-23: 5 mg via ORAL
  Filled 2024-04-23: qty 1

## 2024-04-23 NOTE — Progress Notes (Signed)
   04/23/24 1500  Spiritual Encounters  Type of Visit Initial  Care provided to: Pt and family  Reason for visit Urgent spiritual support  OnCall Visit No   Chaplain responded to a spiritual consult for support. I met with Hannah Khan and her spouse, Hannah Khan. Both welcomed me into their space. When I arrived and introduced myself, Hannah Khan was talking with and individual on the phone and was in a distraught state. Hannah Khan explored the need to have boundaries around those who try to control her and her decisions. She is capable of making decisions and should do so. Hannah Khan also shared that she is so emotional because of her struggle with sleep.  I encouraged her to make rest her priority because her health needs that rest. As :Hannah Khan explored her life and the many incidents of trauma that have been part of her journey she is actively examining how they have effected her. As we looked at this Hannah Khan spoke of a longing for community they lost their [place] community and when she retired she lost her work [employer]/text[employer] as well. The isolation it difficult and they both hope to find a church that they can call home. As I departed I prayed with them and wished Hannah Khan a peaceful nights rest.   Hannah Khan [facility]  [facility]  [phone]

## 2024-04-23 NOTE — Progress Notes (Signed)
 NAME: Hannah Khan  DOB: 09/04/1956  MRN: [id number]  Date/Time: 04/23/2024 8:26 PM   Subjective:   ? Hannah Khan is a 68 y.o.female  with a history of NASH, cirrhosis, PHT, gastric varices, main portal vein chronic thrombosis, afib, DM, HTN, CHF, CKD, h/o panniculitis and cellulitis. Morbid obesity Husband at bed side   I? Current Facility-Administered Medications  Medication Dose Route Frequency Provider Last Rate Last Admin   0.9 %  sodium chloride  infusion (Manually program via Guardrails IV Fluids)   Intravenous Once Gonfa, Taye T, MD       albuterol  (PROVENTIL ) (2.5 MG/3ML) 0.083% nebulizer solution 2.5 mg  2.5 mg Nebulization Q4H PRN Gonfa, Taye T, MD       Chlorhexidine  Gluconate Cloth 2 % PADS 6 each  6 each Topical QHS Gonfa, Taye T, MD   6 each at 04/19/24 2117   DAPTOmycin  (CUBICIN ) IVPB 700 mg/199mL premix  8 mg/kg (Adjusted) Intravenous Q1400 Gonfa, Taye T, MD   Stopped at 04/23/24 1335   digoxin  (LANOXIN ) tablet 125 mcg  125 mcg Oral Daily Gonfa, Taye T, MD   125 mcg at 04/23/24 0944   doxycycline  (VIBRA -TABS) tablet 100 mg  100 mg Oral Q12H Gonfa, Taye T, MD   100 mg at 04/23/24 0944   famotidine  (PEPCID ) IVPB 20 mg premix  20 mg Intravenous Q12H Gonfa, Taye T, MD   Stopped at 04/23/24 1013   insulin  aspart (novoLOG ) injection 0-15 Units  0-15 Units Subcutaneous TID WC Gonfa, Taye T, MD   2 Units at 04/23/24 1751   insulin  aspart (novoLOG ) injection 0-5 Units  0-5 Units Subcutaneous QHS Gonfa, Taye T, MD   2 Units at 04/22/24 2126   insulin  aspart (novoLOG ) injection 3 Units  3 Units Subcutaneous TID WC Gonfa, Taye T, MD   3 Units at 04/23/24 1751   insulin  glargine-yfgn (SEMGLEE ) injection 24 Units  24 Units Subcutaneous Daily Gonfa, Taye T, MD   24 Units at 04/23/24 0943   lactulose  (CHRONULAC ) 10 GM/15ML solution 20 g  20 g Oral TID Gonfa, Taye T, MD   20 g at 04/22/24 1701   lidocaine  (LIDODERM ) 5 % 2 patch  2 patch Transdermal Daily PRN Gonfa, Taye T, MD   2 patch  at 04/19/24 0410   melatonin tablet 5 mg  5 mg Oral QHS Adhikari, Ivonne, MD       methocarbamol  (ROBAXIN ) tablet 500 mg  500 mg Oral Q8H PRN Gonfa, Taye T, MD   500 mg at 04/23/24 1751   metoprolol  tartrate (LOPRESSOR ) tablet 50 mg  50 mg Oral BID Gonfa, Taye T, MD   50 mg at 04/23/24 0944   midodrine  (PROAMATINE ) tablet 10 mg  10 mg Oral TID with meals Gonfa, Taye T, MD   10 mg at 04/23/24 1751   ondansetron  (ZOFRAN ) injection 4 mg  4 mg Intravenous Q6H PRN Gonfa, Taye T, MD       Oral care mouth rinse  15 mL Mouth Rinse PRN Gonfa, Taye T, MD       pantoprazole  (PROTONIX ) EC tablet 40 mg  40 mg Oral BID Gonfa, Taye T, MD   40 mg at 04/23/24 0944   rifaximin  (XIFAXAN ) tablet 550 mg  550 mg Oral BID Gonfa, Taye T, MD   550 mg at 04/23/24 0944   sodium chloride  flush (NS) 0.9 % injection 3 mL  3 mL Intravenous Q12H Gonfa, Taye T, MD   3 mL at 04/22/24 2128  torsemide  (DEMADEX ) tablet 40 mg  40 mg Oral Daily Ogbata, Sylvester I, MD   40 mg at 04/23/24 1401   [START ON 04/24/2024] torsemide  (DEMADEX ) tablet 80 mg  80 mg Oral Daily Jillian Buttery, MD         Abtx:  Anti-infectives (From admission, onward)    Start     Dose/Rate Route Frequency Ordered Stop   04/21/24 0300  vancomycin  (VANCOREADY) IVPB 1500 mg/300 mL  Status:  Discontinued        1,500 mg 150 mL/hr over 120 Minutes Intravenous Every 48 hours 04/19/24 0229 04/19/24 0941   04/20/24 1000  doxycycline  (VIBRA -TABS) tablet 100 mg        100 mg Oral Every 12 hours 04/20/24 0857     04/20/24 1000  DAPTOmycin  (CUBICIN ) IVPB 700 mg/169mL premix        8 mg/kg  91.2 kg (Adjusted) 200 mL/hr over 30 Minutes Intravenous Daily 04/20/24 0859     04/19/24 1800  cefTRIAXone  (ROCEPHIN ) 2 g in sodium chloride  0.9 % 100 mL IVPB  Status:  Discontinued        2 g 200 mL/hr over 30 Minutes Intravenous Every 24 hours 04/19/24 0215 04/19/24 0942   04/19/24 1500  ceFEPIme  (MAXIPIME ) 2 g in sodium chloride  0.9 % 100 mL IVPB  Status:  Discontinued         2 g 200 mL/hr over 30 Minutes Intravenous Every 12 hours 04/19/24 1450 04/21/24 1647   04/19/24 1000  rifaximin  (XIFAXAN ) tablet 550 mg        550 mg Oral 2 times daily 04/19/24 0218     04/19/24 1000  piperacillin -tazobactam (ZOSYN ) IVPB 3.375 g  Status:  Discontinued        3.375 g 12.5 mL/hr over 240 Minutes Intravenous Every 8 hours 04/19/24 0942 04/19/24 1449   04/19/24 0315  vancomycin  (VANCOREADY) IVPB 2000 mg/400 mL        2,000 mg 200 mL/hr over 120 Minutes Intravenous  Once 04/19/24 0227 04/19/24 0502   04/19/24 0214  azithromycin  (ZITHROMAX ) tablet 500 mg  Status:  Discontinued        500 mg Oral Daily at bedtime 04/19/24 0215 04/20/24 0857   04/18/24 1900  azithromycin  (ZITHROMAX ) 500 mg in sodium chloride  0.9 % 250 mL IVPB        500 mg 250 mL/hr over 60 Minutes Intravenous  Once 04/18/24 1856 04/18/24 2042   04/18/24 1815  cefTRIAXone  (ROCEPHIN ) 2 g in sodium chloride  0.9 % 100 mL IVPB        2 g 200 mL/hr over 30 Minutes Intravenous  Once 04/18/24 1802 04/18/24 1919       REVIEW OF SYSTEMS:  Weakness Sob Cough Chills Redness, pain left thigh much improved Says she is a picker- Multiple excoriations skin  Objective:  VITALS:  BP (!) 120/49 (BP Location: Left Arm)   Pulse 88   Temp 98.2 F (36.8 C) (Oral)   Resp 19   Ht 5' 7 (1.702 m)   Wt 134 kg   SpO2 99%   BMI 46.27 kg/m   PHYSICAL EXAM:  General: Alert, cooperative,  Lungs: b/l air entry. Heart: 4/6 systolic murmur. Abdomen: Soft,pannus inmnduration on the rt side- no erythema or tenderness. Bowel sounds normal. No masses Extremities: b/l lymphedema Left thigh erythema and tenderness has resolved     Skin: multiple excoriations scattered over lower extremities rt shoulder arms Lymph: Cervical, supraclavicular normal. Neurologic: Grossly non-focal Pertinent Labs Lab Results CBC  Component Value Date/Time   WBC 4.9 04/23/2024 0445   RBC 2.90 (L) 04/23/2024 0445   HGB 8.0 (L)  04/23/2024 0445   HGB 14.5 12/12/2022 0942   HGB 12.6 08/25/2017 1418   HCT 27.2 (L) 04/23/2024 0445   HCT 42.2 12/12/2022 0942   HCT 37.3 08/25/2017 1418   PLT 37 (L) 04/23/2024 0445   PLT Comment 12/12/2022 0942   MCV 93.8 04/23/2024 0445   MCV 93 12/12/2022 0942   MCV 91.2 08/25/2017 1418   MCH 27.6 04/23/2024 0445   MCHC 29.4 (L) 04/23/2024 0445   RDW 19.1 (H) 04/23/2024 0445   RDW 14.7 12/12/2022 0942   RDW 14.8 (H) 08/25/2017 1418   LYMPHSABS 0.5 (L) 04/23/2024 0445   LYMPHSABS 0.8 11/16/2021 1111   LYMPHSABS 0.7 (L) 08/25/2017 1418   MONOABS 0.8 04/23/2024 0445   MONOABS 0.3 08/25/2017 1418   EOSABS 0.1 04/23/2024 0445   EOSABS 0.1 11/16/2021 1111   BASOSABS 0.0 04/23/2024 0445   BASOSABS 0.0 11/16/2021 1111   BASOSABS 0.0 08/25/2017 1418       Latest Ref Rng & Units 04/23/2024    4:45 AM 04/22/2024    5:02 AM 04/21/2024    5:42 AM  CMP  Glucose 70 - 99 mg/dL 843  825  807   BUN 8 - 23 mg/dL 47  50  53   Creatinine 0.44 - 1.00 mg/dL 8.47  8.32  7.97   Sodium 135 - 145 mmol/L 134  133  131   Potassium 3.5 - 5.1 mmol/L 3.7  3.9  4.0   Chloride 98 - 111 mmol/L 101  100  99   CO2 22 - 32 mmol/L 22  21  22    Calcium  8.9 - 10.3 mg/dL 9.0  9.2  8.8   Total Protein 6.5 - 8.1 g/dL  6.5  6.1   Total Bilirubin 0.0 - 1.2 mg/dL  3.6  3.3   Alkaline Phos 38 - 126 U/L  71  62   AST 15 - 41 U/L  32  29   ALT 0 - 44 U/L  19  19       Microbiology: Recent Results (from the past 240 hours)  Culture, blood (routine x 2)     Status: Abnormal   Collection Time: 04/18/24  4:46 PM   Specimen: BLOOD RIGHT ARM  Result Value Ref Range Status   Specimen Description   Final    BLOOD RIGHT ARM Performed at [facility], [address] [place], [place] [address]    Special Requests   Final    BOTTLES DRAWN AEROBIC AND ANAEROBIC Blood Culture adequate volume Performed at [facility], [address] [place], [place] [address]    Culture  Setup Time   Final     GRAM POSITIVE COCCI IN CLUSTERS IN BOTH AEROBIC AND ANAEROBIC BOTTLES CRITICAL RESULT CALLED TO, READ BACK BY AND VERIFIED WITH: PHARMD N. GLOGOVAC [id number] @ 1650 FH Performed at [facility], [address], [place], [place] [address]    Culture STAPHYLOCOCCUS EPIDERMIDIS (A)  Final   Report Status 04/21/2024 FINAL  Final   Organism ID, Bacteria STAPHYLOCOCCUS EPIDERMIDIS  Final      Susceptibility   Staphylococcus epidermidis - MIC*    CIPROFLOXACIN <=0.5 SENSITIVE Sensitive     ERYTHROMYCIN >=8 RESISTANT Resistant     GENTAMICIN <=0.5 SENSITIVE Sensitive     OXACILLIN >=4 RESISTANT Resistant     TETRACYCLINE  2 SENSITIVE Sensitive     VANCOMYCIN  1 SENSITIVE Sensitive     TRIMETH/SULFA 20 SENSITIVE Sensitive     CLINDAMYCIN  >=8 RESISTANT Resistant     RIFAMPIN  8 RESISTANT Resistant     Inducible Clindamycin  NEGATIVE Sensitive     * STAPHYLOCOCCUS EPIDERMIDIS  Blood Culture ID Panel (Reflexed)     Status: Abnormal   Collection Time: 04/18/24  4:46 PM  Result Value Ref Range Status   Enterococcus faecalis NOT DETECTED NOT DETECTED Final   Enterococcus Faecium NOT DETECTED NOT DETECTED Final   Listeria monocytogenes NOT DETECTED NOT DETECTED Final   Staphylococcus species DETECTED (A) NOT DETECTED Final    Comment: CRITICAL RESULT CALLED TO, READ BACK BY AND VERIFIED WITH: PHARMD N. GLOGOVAC [id number] @ 1650 FH    Staphylococcus aureus (BCID) NOT DETECTED NOT DETECTED Final   Staphylococcus epidermidis DETECTED (A) NOT DETECTED Final    Comment: Methicillin (oxacillin) resistant coagulase negative staphylococcus. Possible blood culture contaminant (unless isolated from more than one blood culture draw or clinical case suggests pathogenicity). No antibiotic treatment is indicated for blood  culture contaminants. CRITICAL RESULT CALLED TO, READ BACK BY AND VERIFIED WITH: PHARMD N. GLOGOVAC [id number] @ 1650 FH    Staphylococcus lugdunensis NOT DETECTED NOT DETECTED Final    Streptococcus species NOT DETECTED NOT DETECTED Final   Streptococcus agalactiae NOT DETECTED NOT DETECTED Final   Streptococcus pneumoniae NOT DETECTED NOT DETECTED Final   Streptococcus pyogenes NOT DETECTED NOT DETECTED Final   A.calcoaceticus-baumannii NOT DETECTED NOT DETECTED Final   Bacteroides fragilis NOT DETECTED NOT DETECTED Final   Enterobacterales NOT DETECTED NOT DETECTED Final   Enterobacter cloacae complex NOT DETECTED NOT DETECTED Final   Escherichia coli NOT DETECTED NOT DETECTED Final   Klebsiella aerogenes NOT DETECTED NOT DETECTED Final   Klebsiella oxytoca NOT DETECTED NOT DETECTED Final   Klebsiella pneumoniae NOT DETECTED NOT DETECTED Final   Proteus species NOT DETECTED NOT DETECTED Final   Salmonella species NOT DETECTED NOT DETECTED Final   Serratia marcescens NOT DETECTED NOT DETECTED Final   Haemophilus influenzae NOT DETECTED NOT DETECTED Final   Neisseria meningitidis NOT DETECTED NOT DETECTED Final   Pseudomonas aeruginosa NOT DETECTED NOT DETECTED Final   Stenotrophomonas maltophilia NOT DETECTED NOT DETECTED Final   Candida albicans NOT DETECTED NOT DETECTED Final   Candida auris NOT DETECTED NOT DETECTED Final   Candida glabrata NOT DETECTED NOT DETECTED Final   Candida krusei NOT DETECTED NOT DETECTED Final   Candida parapsilosis NOT DETECTED NOT DETECTED Final   Candida tropicalis NOT DETECTED NOT DETECTED Final   Cryptococcus neoformans/gattii NOT DETECTED NOT DETECTED Final   Methicillin resistance mecA/C DETECTED (A) NOT DETECTED Final    Comment: CRITICAL RESULT CALLED TO, READ BACK BY AND VERIFIED WITH: PHARMD N. GLOGOVAC [id number] @ 1650 FH Performed at [facility], [address], [place], [place] [address]   Culture, blood (routine x 2)     Status: Abnormal   Collection Time: 04/18/24  4:51 PM   Specimen: BLOOD LEFT ARM  Result Value Ref Range Status   Specimen Description   Final    BLOOD LEFT ARM Performed at [facility], [address], [place], [place] [address]    Special Requests   Final    BOTTLES DRAWN AEROBIC AND ANAEROBIC Blood Culture adequate volume Performed at [facility], [address] [place], [place] [address]    Culture  Setup Time   Final  GRAM POSITIVE COCCI IN CLUSTERS IN BOTH AEROBIC AND ANAEROBIC BOTTLES CRITICAL RESULT CALLED TO, READ BACK BY AND VERIFIED WITH: PHARMD N. GLOGOVAC [id number] @ 1650 FH Performed at [facility], [address], [place], [place] [address]    Culture STAPHYLOCOCCUS EPIDERMIDIS (A)  Final   Report Status 04/21/2024 FINAL  Final   Organism ID, Bacteria STAPHYLOCOCCUS EPIDERMIDIS  Final      Susceptibility   Staphylococcus epidermidis - MIC*    CIPROFLOXACIN <=0.5 SENSITIVE Sensitive     ERYTHROMYCIN >=8 RESISTANT Resistant     GENTAMICIN <=0.5 SENSITIVE Sensitive     OXACILLIN >=4 RESISTANT Resistant     TETRACYCLINE 2 SENSITIVE Sensitive     VANCOMYCIN  2 SENSITIVE Sensitive     TRIMETH/SULFA 20 SENSITIVE Sensitive     CLINDAMYCIN  >=8 RESISTANT Resistant     RIFAMPIN  16 RESISTANT Resistant     Inducible Clindamycin  NEGATIVE Sensitive     * STAPHYLOCOCCUS EPIDERMIDIS  Resp panel by RT-PCR (RSV, Flu A&B, Covid) Anterior Nasal Swab     Status: None   Collection Time: 04/19/24  1:22 AM   Specimen: Anterior Nasal Swab  Result Value Ref Range Status   SARS Coronavirus 2 by RT PCR NEGATIVE NEGATIVE Final    Comment: (NOTE) SARS-CoV-2 target nucleic acids are NOT DETECTED.  The SARS-CoV-2 RNA is generally detectable in upper respiratory specimens during the acute phase of infection. The lowest concentration of SARS-CoV-2 viral copies this assay can detect is 138 copies/mL. A negative result does not preclude SARS-Cov-2 infection and should not be used as the sole basis for treatment or other patient management decisions. A negative result may occur with  improper specimen collection/handling, submission of specimen other than  nasopharyngeal swab, presence of viral mutation(s) within the areas targeted by this assay, and inadequate number of viral copies(<138 copies/mL). A negative result must be combined with clinical observations, patient history, and epidemiological information. The expected result is Negative.  Fact Sheet for Patients:  BloggerCourse.com  Fact Sheet for Healthcare Providers:  SeriousBroker.it  This test is no t yet approved or cleared by the [place]  FDA and  has been authorized for detection and/or diagnosis of SARS-CoV-2 by FDA under an Emergency Use Authorization (EUA). This EUA will remain  in effect (meaning this test can be used) for the duration of the COVID-19 declaration under Section 564(b)(1) of the Act, 21 U.S.C.section 360bbb-3(b)(1), unless the authorization is terminated  or revoked sooner.       Influenza A by PCR NEGATIVE NEGATIVE Final   Influenza B by PCR NEGATIVE NEGATIVE Final    Comment: (NOTE) The Xpert Xpress SARS-CoV-2/FLU/RSV plus assay is intended as an aid in the diagnosis of influenza from Nasopharyngeal swab specimens and should not be used as a sole basis for treatment. Nasal washings and aspirates are unacceptable for Xpert Xpress SARS-CoV-2/FLU/RSV testing.  Fact Sheet for Patients: BloggerCourse.com  Fact Sheet for Healthcare Providers: SeriousBroker.it  This test is not yet approved or cleared by the [place]  FDA and has been authorized for detection and/or diagnosis of SARS-CoV-2 by FDA under an Emergency Use Authorization (EUA). This EUA will remain in effect (meaning this test can be used) for the duration of the COVID-19 declaration under Section 564(b)(1) of the Act, 21 U.S.C. section 360bbb-3(b)(1), unless the authorization is terminated or revoked.     Resp Syncytial Virus by PCR NEGATIVE NEGATIVE Final    Comment:  (NOTE) Fact Sheet for Patients: BloggerCourse.com  Fact Sheet for  Healthcare Providers: SeriousBroker.it  This test is not yet approved or cleared by the [place]  FDA and has been authorized for detection and/or diagnosis of SARS-CoV-2 by FDA under an Emergency Use Authorization (EUA). This EUA will remain in effect (meaning this test can be used) for the duration of the COVID-19 declaration under Section 564(b)(1) of the Act, 21 U.S.C. section 360bbb-3(b)(1), unless the authorization is terminated or revoked.  Performed at [facility], [address] [place], [place] [address]   MRSA Next Gen by PCR, Nasal     Status: None   Collection Time: 04/19/24  1:22 AM   Specimen: Anterior Nasal Swab  Result Value Ref Range Status   MRSA by PCR Next Gen NOT DETECTED NOT DETECTED Final    Comment: (NOTE) The GeneXpert MRSA Assay (FDA approved for NASAL specimens only), is one component of a comprehensive MRSA colonization surveillance program. It is not intended to diagnose MRSA infection nor to guide or monitor treatment for MRSA infections. Test performance is not FDA approved in patients less than 29 years old. Performed at [facility], [address], [place], [place] [address]   Respiratory (~20 pathogens) panel by PCR     Status: None   Collection Time: 04/19/24  9:00 AM   Specimen: Nasopharyngeal Swab; Respiratory  Result Value Ref Range Status   Adenovirus NOT DETECTED NOT DETECTED Final   Coronavirus 229E NOT DETECTED NOT DETECTED Final    Comment: (NOTE) The Coronavirus on the Respiratory Panel, DOES NOT test for the novel  Coronavirus (2019 nCoV)    Coronavirus HKU1 NOT DETECTED NOT DETECTED Final   Coronavirus NL63 NOT DETECTED NOT DETECTED Final   Coronavirus OC43 NOT DETECTED NOT DETECTED Final   Metapneumovirus NOT DETECTED NOT DETECTED Final   Rhinovirus / Enterovirus NOT  DETECTED NOT DETECTED Final   Influenza A NOT DETECTED NOT DETECTED Final   Influenza B NOT DETECTED NOT DETECTED Final   Parainfluenza Virus 1 NOT DETECTED NOT DETECTED Final   Parainfluenza Virus 2 NOT DETECTED NOT DETECTED Final   Parainfluenza Virus 3 NOT DETECTED NOT DETECTED Final   Parainfluenza Virus 4 NOT DETECTED NOT DETECTED Final   Respiratory Syncytial Virus NOT DETECTED NOT DETECTED Final   Bordetella pertussis NOT DETECTED NOT DETECTED Final   Bordetella Parapertussis NOT DETECTED NOT DETECTED Final   Chlamydophila pneumoniae NOT DETECTED NOT DETECTED Final   Mycoplasma pneumoniae NOT DETECTED NOT DETECTED Final    Comment: Performed at [facility], [address], [place], [place] [address]  Culture, blood (Routine X 2) w Reflex to ID Panel     Status: None (Preliminary result)   Collection Time: 04/21/24 12:57 PM   Specimen: BLOOD  Result Value Ref Range Status   Specimen Description   Final    BLOOD BLOOD LEFT HAND Performed at [facility], [address], [place], [place] [address]    Special Requests   Final    BOTTLES DRAWN AEROBIC ONLY Blood Culture results may not be optimal due to an inadequate volume of blood received in culture bottles Performed at [facility], [address] [place], [place] [address]    Culture   Final    NO GROWTH 2 DAYS Performed at [facility], [address], [place], [place] [address]    Report Status PENDING  Incomplete  Culture, blood (Routine X 2) w Reflex to ID Panel     Status: None (  Preliminary result)   Collection Time: 04/21/24 12:57 PM   Specimen: BLOOD  Result Value Ref Range Status   Specimen Description   Final    BLOOD BLOOD LEFT HAND Performed at [facility], [address], [place], [place] [address]    Special Requests   Final    BOTTLES DRAWN AEROBIC ONLY Blood Culture results may not be optimal due to an inadequate volume of blood  received in culture bottles Performed at [facility], [address] [place], [place] [address]    Culture   Final    NO GROWTH 2 DAYS Performed at [facility], [address], [place], [place] [address]    Report Status PENDING  Incomplete    Lines and Device Date on insertion # of days DC  Central line     Foley     ETT       IMAGING RESULTS: CXR left basilar opacity I have personally reviewed the films ? Impression/Recommendation ?Staphylococcal epidermidis bacteremia-- from the left leg cellulitis which is much better Has  muliple excoriations on her skin can be the source of bacteremia  She has a chronic systolic murmur b but the risk for endocarditis is less as she has no prosthetic valve or cardiac device 2 d echo p[oor study- no need for TEE now  Will treat for 2 weeks with antibiotic and can repeat blood culture off antibiotics at a later date Can do dalbavancin 1500mg   long acting one dose  before discharge and she will not need any IV antibiotic as OPAT  Possible LLL pneumonia- on doxy  Afib  NASH with cirrhosis , varices  Severe thrombocytopenia  Morbid obesity OSA ? ?Discussed the management with the patient and her husband

## 2024-04-23 NOTE — Progress Notes (Signed)
 Physical Therapy Treatment Patient Details Name: Hannah Khan MRN: [id number] DOB: [birth year] Today's Date: 04/23/2024   History of Present Illness 68 y.o. female who presented due to concern for left thigh rash to [facility] OP clinic and referred to ED for r/o DVT, now transferred from [facility] ED for further treatment of cellulitis, possible pneumonia, and A-fib RVR. Pt with hx of decompensated Nash cirrhosis, prior history of EV bleed , failed TIPS, chronic anemia and thrombocytopenia, A-fib not on anticoagulation, mild to moderate aortic stenosis, HFpEF, CKD stage IIIb, diabetes type 2, hypertension, morbid obesity, OSA on CPAP, fibromyalgia.    PT Comments  Patient progressing to hallway ambulation and noting some SOB but no pain in her L leg.  She states did not have fluid pill since here till last night and has lost 5 # already.  Patient soiled with urine, though able to clean up in bathroom with min A.  Feel she will continue to progress with skilled PT in the acute setting and potentially need follow up HHPT at d/c. Hannah Khan, PT Acute Rehabilitation Services Office:[phone] 04/23/2024     If plan is discharge home, recommend the following: A little help with walking and/or transfers;A little help with bathing/dressing/bathroom;Assistance with cooking/housework;Assist for transportation;Help with stairs or ramp for entrance   Can travel by private vehicle        Equipment Recommendations  None recommended by PT    Recommendations for Other Services       Precautions / Restrictions Precautions Precautions: Fall     Mobility  Bed Mobility         Supine to sit: Modified independent (Device/Increase time)          Transfers Overall transfer level: Needs assistance Equipment used: Rolling walker (2 wheels) Transfers: Sit to/from Stand Sit to Stand: Supervision           General transfer comment: cues for hand placement to toilet in bathroom     Ambulation/Gait Ambulation/Gait assistance: Contact guard assist Gait Distance (Feet): 70 Feet Assistive device: Rolling walker (2 wheels) Gait Pattern/deviations: Step-through pattern, Decreased stride length       General Gait Details: dyspnea, though SpO2 94% or higher on RA.   Stairs             Wheelchair Mobility     Tilt Bed    Modified Rankin (Stroke Patients Only)       Balance Overall balance assessment: Needs assistance Sitting-balance support: Feet supported Sitting balance-Leahy Scale: Good     Standing balance support: Bilateral upper extremity supported Standing balance-Leahy Scale: Fair Standing balance comment: standing to pull up briefs                            Communication Communication Communication: No apparent difficulties  Cognition Arousal: Alert Behavior During Therapy: WFL for tasks assessed/performed   PT - Cognitive impairments: No apparent impairments                       PT - Cognition Comments: having visual hallucinations, but recognizes that is what they are Following commands: Intact      Cueing Cueing Techniques: Verbal cues  Exercises      General Comments General comments (skin integrity, edema, etc.): spouse present, pt with purewick soiled brief and two pads under her soiled, reports had not gotten her fluid pill since here till last night.  Pertinent Vitals/Pain Pain Assessment Pain Assessment: No/denies pain    Home Living                          Prior Function            PT Goals (current goals can now be found in the care plan section) Progress towards PT goals: Progressing toward goals    Frequency    Min 2X/week      PT Plan      Co-evaluation              AM-PAC PT 6 Clicks Mobility   Outcome Measure  Help needed turning from your back to your side while in a flat bed without using bedrails?: None Help needed moving from lying on  your back to sitting on the side of a flat bed without using bedrails?: None Help needed moving to and from a bed to a chair (including a wheelchair)?: A Little Help needed standing up from a chair using your arms (e.g., wheelchair or bedside chair)?: A Little Help needed to walk in hospital room?: A Little Help needed climbing 3-5 steps with a railing? : A Lot 6 Click Score: 19    End of Session   Activity Tolerance: Patient tolerated treatment well Patient left: in bed;Other (comment) (seated on EOB spouse present)   PT Visit Diagnosis: Difficulty in walking, not elsewhere classified (R26.2)     Time: 8763-8744 PT Time Calculation (min) (ACUTE ONLY): 19 min  Charges:    $Gait Training: 8-22 mins PT General Charges $$ ACUTE PT VISIT: 1 Visit                     Hannah Khan, PT Acute Rehabilitation Services Office:[phone] 04/23/2024    Hannah Khan 04/23/2024, 1:45 PM

## 2024-04-23 NOTE — TOC Progression Note (Signed)
 Transition of Care [facility]) - Progression Note    Patient Details  Name: Hannah Khan MRN: [id number] Date of Birth: Mar 23, 1956  Transition of Care [facility]) CM/SW Contact  Terea Neubauer, Nathanel, RN Phone Number: 04/23/2024, 2:01 PM  Clinical Narrative: Spoke to spouse(Terry) agree to add nursing-disease mgmnt to [facility] rep Lynette aware of HHRN/PT. Has own transport home.      Expected Discharge Plan: Home w Home Health Services Barriers to Discharge: Continued Medical Work up               Expected Discharge Plan and Services   Discharge Planning Services: CM Consult Post Acute Care Choice: Home Health Living arrangements for the past 2 months: Single Family Home                 DME Arranged: N/A DME Agency: NA       HH Arranged: RN, PT, Disease Management HH Agency: [facility] Date HH Agency Contacted: 04/23/24 Time HH Agency Contacted: 1401 Representative spoke with at [facility] Agency: Arna   Social Drivers of Health (SDOH) Interventions SDOH Screenings   Food Insecurity: No Food Insecurity (04/21/2024)  Housing: Low Risk  (04/21/2024)  Transportation Needs: No Transportation Needs (04/21/2024)  Utilities: Not At Risk (04/21/2024)  Social Connections: Socially Integrated (04/19/2024)  Tobacco Use: Medium Risk (04/18/2024)    Readmission Risk Interventions    04/21/2024    2:22 PM 04/08/2023    9:42 AM 03/12/2023    1:47 PM  Readmission Risk Prevention Plan  Transportation Screening Complete Complete Complete  PCP or Specialist Appt within 3-5 Days  Complete   Home Care Screening   Complete  Medication Review (RN CM)   Complete  HRI or Home Care Consult  Complete   Social Work Consult for Recovery Care Planning/Counseling  Complete   Palliative Care Screening  Not Applicable   Medication Review [employer]) Complete Complete   PCP or Specialist appointment within 3-5 days of discharge Complete    HRI or Home Care Consult Complete    SW Recovery  Care/Counseling Consult Complete    Palliative Care Screening Not Applicable    Skilled Nursing Facility Not Applicable

## 2024-04-23 NOTE — Progress Notes (Signed)
 Patient pleasantly hallucinating, states she is seeing bugs flying around the room and people that are not really there. MD notified and aware.

## 2024-04-23 NOTE — Progress Notes (Signed)
 PROGRESS NOTE  Hannah Khan  FMW:[id number] DOB: 1955-10-16 DOA: 04/18/2024 PCP: Regino Slater, MD   Brief Narrative: Patient is a 68 year old female with history of morbid obesity, Hollie cirrhosis, variceal bleeding status post BRTO, failed TIPS, HFpEF, A-fib not on anticoagulation, CKD stage IIIb, diabetes type 2, anemia, thrombocytopenia, OSA on CPAP who presented here with complaint of left thigh redness, low blood pressure.  She was found to have sepsis secondary to left thigh cellulitis also found to be in A-fib with RVR.  Started on Cardizem  drip.  Started on broad-spectrum antibiotics for sepsis/cellulitis.  Blood cultures showed Staph epidermidis in 4 out of 4 bottles.  ID consulted and started on daptomycin .  Repeat blood cultures have been sent, no growth so far.  Waiting for final ID for finalization of antibiotic before discharge planning  Assessment & Plan:  Principal Problem:   Staphylococcus epidermidis bacteremia Active Problems:   Obstructive sleep apnea   Essential hypertension   Aortic heart murmur   Permanent atrial fibrillation (HCC)   Thrombocytopenia (HCC)   Sepsis (HCC)   Type 2 diabetes mellitus (HCC)   Severe sepsis (HCC)   Cellulitis of left lower extremity  Bacteremia/sepsis secondary to left lower extremity cellulitis/possible left sided pneumonia: Presented with fever, tachycardia, tachypnea.  Initial lactic acid was 2.6.  Chest x-ray showed retrocardiac opacity but patient without respiratory symptoms.  Had cellulitis on the left lower extremity.  Blood cultures showed Staph epidermidis in 4 out of 4 bottles.  Improving sepsis physiology.  Currently on doxycycline , daptomycin .  Repeat blood cultures sent on 8/3, no growth till date.  Follow-up with ID for finalization  of antibiotic. 2D echo did not show any vegetations but was a poor quality study, showed normal EF  Persistent A-fib with RVR: Was in RVR on presentation.  Currently has resolved.  On  metoprolol , digoxin .  TSH normal.  Increased the dose of metoprolol .  Not on digoxin  due to history of GI bleed.  Acute on chronic blood loss anemia: Currently hemoglobin stable.  Given 2 units of blood transfusion since hospitalization.  On Protonix .  GI was following here.  Recommended outpatient EGD.  Currently hemoglobin stable in the range of 8.  Also has stable thrombocytopenia secondary to NASH  NASH cirrhosis: Currently compensated.  No encephalopathy.  MELD-NA score 25.  Currently on lactulose , rifaximin .  Torsemide  restarted.  Follow-up with GI as an outpatient  AKI in CKD stage IIIb: Baseline creatinine ranges from 1.7-2.  AKI has improved and currently kidney function at baseline.  Continue current diuretics.  Insulin -dependent diabetes type 2: Recent A1c of 6.  Continue Semglee , sliding scale  OSA: On CPAP  Hyponatremia: Improved  Morbid obesity: BMI of 46.2        DVT prophylaxis:SCDs Start: 04/19/24 0212     Code Status: Full Code  Family Communication: Discussed with husband at bedside on 8/5  Patient status:Inpatient  Patient is from :home  Anticipated discharge un:ynfz  Estimated DC date:1-2 days, waiting for ID for finalization of antibiotic   Consultants: ID  Procedures: None  Antimicrobials:  Anti-infectives (From admission, onward)    Start     Dose/Rate Route Frequency Ordered Stop   04/21/24 0300  vancomycin  (VANCOREADY) IVPB 1500 mg/300 mL  Status:  Discontinued        1,500 mg 150 mL/hr over 120 Minutes Intravenous Every 48 hours 04/19/24 0229 04/19/24 0941   04/20/24 1000  doxycycline  (VIBRA -TABS) tablet 100 mg  100 mg Oral Every 12 hours 04/20/24 0857     04/20/24 1000  DAPTOmycin  (CUBICIN ) IVPB 700 mg/100mL premix        8 mg/kg  91.2 kg (Adjusted) 200 mL/hr over 30 Minutes Intravenous Daily 04/20/24 0859     04/19/24 1800  cefTRIAXone  (ROCEPHIN ) 2 g in sodium chloride  0.9 % 100 mL IVPB  Status:  Discontinued        2 g 200  mL/hr over 30 Minutes Intravenous Every 24 hours 04/19/24 0215 04/19/24 0942   04/19/24 1500  ceFEPIme  (MAXIPIME ) 2 g in sodium chloride  0.9 % 100 mL IVPB  Status:  Discontinued        2 g 200 mL/hr over 30 Minutes Intravenous Every 12 hours 04/19/24 1450 04/21/24 1647   04/19/24 1000  rifaximin  (XIFAXAN ) tablet 550 mg        550 mg Oral 2 times daily 04/19/24 0218     04/19/24 1000  piperacillin -tazobactam (ZOSYN ) IVPB 3.375 g  Status:  Discontinued        3.375 g 12.5 mL/hr over 240 Minutes Intravenous Every 8 hours 04/19/24 0942 04/19/24 1449   04/19/24 0315  vancomycin  (VANCOREADY) IVPB 2000 mg/400 mL        2,000 mg 200 mL/hr over 120 Minutes Intravenous  Once 04/19/24 0227 04/19/24 0502   04/19/24 0214  azithromycin  (ZITHROMAX ) tablet 500 mg  Status:  Discontinued        500 mg Oral Daily at bedtime 04/19/24 0215 04/20/24 0857   04/18/24 1900  azithromycin  (ZITHROMAX ) 500 mg in sodium chloride  0.9 % 250 mL IVPB        500 mg 250 mL/hr over 60 Minutes Intravenous  Once 04/18/24 1856 04/18/24 2042   04/18/24 1815  cefTRIAXone  (ROCEPHIN ) 2 g in sodium chloride  0.9 % 100 mL IVPB        2 g 200 mL/hr over 30 Minutes Intravenous  Once 04/18/24 1802 04/18/24 1919       Subjective: Patient seen and examined at bedside today.  Hemodynamically stable during my evaluation.  Alert and oriented.  There was report of hallucination by the RN.  Not agitated.  Oxycodone  discontinued.  She says she could not sleep last night because torsemide  was given in the evening.  She is very emotional and tearful given her current medical condition.  Husband at bedside  Objective: Vitals:   04/22/24 2100 04/23/24 0400 04/23/24 0515 04/23/24 0843  BP:   (!) 114/51 115/75  Pulse: (!) 108  99 (!) 105  Resp:   19   Temp:   97.8 F (36.6 C)   TempSrc:   Oral   SpO2:   97%   Weight:  134 kg    Height:        Intake/Output Summary (Last 24 hours) at 04/23/2024 1250 Last data filed at 04/23/2024 1215 Gross  per 24 hour  Intake 323.03 ml  Output --  Net 323.03 ml   Filed Weights   04/21/24 0448 04/22/24 0348 04/23/24 0400  Weight: 130.7 kg (!) 136.4 kg 134 kg    Examination:  General exam: Emotional, tearful, morbidly obese HEENT: PERRL Respiratory system:  no wheezes or crackles  Cardiovascular system: Irregularly irregular rhythm Gastrointestinal system: Abdomen is obese, soft and nontender. Central nervous system: Alert and oriented Extremities: Trace bilateral lower extremity edema, no clubbing ,no cyanosis Skin: Scattered bruises, shallow ulcerations   Data Reviewed: I have personally reviewed following labs and imaging studies  CBC: Recent Labs  Lab 04/18/24 1908 04/19/24 0317 04/19/24 1517 04/19/24 1518 04/20/24 0305 04/21/24 0542 04/22/24 0502 04/23/24 0445  WBC 4.2 7.1 6.0  --  4.5 3.8* 4.8 4.9  NEUTROABS 3.7 6.7  --   --   --   --   --  3.4  HGB 5.9* 6.0* 7.8* 7.8* 7.1* 7.0* 8.4* 8.0*  HCT 18.7* 19.9* 24.6* 25.2* 23.1* 22.8* 27.3* 27.2*  MCV 87.4 92.1 89.1  --  89.5 90.5 94.1 93.8  PLT 26* 30* 36*  --  30* 32* 41* 37*   Basic Metabolic Panel: Recent Labs  Lab 04/19/24 0317 04/20/24 0305 04/21/24 0542 04/22/24 0502 04/23/24 0445  NA 131* 128* 131* 133* 134*  K 4.4 4.1 4.0 3.9 3.7  CL 102 99 99 100 101  CO2 23 20* 22 21* 22  GLUCOSE 250* 229* 192* 174* 156*  BUN 46* 52* 53* 50* 47*  CREATININE 2.14* 2.35* 2.02* 1.67* 1.52*  CALCIUM  8.6* 8.3* 8.8* 9.2 9.0  MG 2.0 2.1 2.5* 2.5*  --   PHOS 3.5 3.5  --   --  2.6     Recent Results (from the past 240 hours)  Culture, blood (routine x 2)     Status: Abnormal   Collection Time: 04/18/24  4:46 PM   Specimen: BLOOD RIGHT ARM  Result Value Ref Range Status   Specimen Description   Final    BLOOD RIGHT ARM Performed at [facility], [address], [place], [place] [address]    Special Requests   Final    BOTTLES DRAWN AEROBIC AND ANAEROBIC Blood Culture adequate volume Performed at [facility], [address] [place], [place] [address]    Culture  Setup Time   Final    GRAM POSITIVE COCCI IN CLUSTERS IN BOTH AEROBIC AND ANAEROBIC BOTTLES CRITICAL RESULT CALLED TO, READ BACK BY AND VERIFIED WITH: PHARMD N. GLOGOVAC [id number] @ 1650 FH Performed at [facility], [address], [place], [place] [address]    Culture STAPHYLOCOCCUS EPIDERMIDIS (A)  Final   Report Status 04/21/2024 FINAL  Final   Organism ID, Bacteria STAPHYLOCOCCUS EPIDERMIDIS  Final      Susceptibility   Staphylococcus epidermidis - MIC*    CIPROFLOXACIN <=0.5 SENSITIVE Sensitive     ERYTHROMYCIN >=8 RESISTANT Resistant     GENTAMICIN <=0.5 SENSITIVE Sensitive     OXACILLIN >=4 RESISTANT Resistant     TETRACYCLINE 2 SENSITIVE Sensitive     VANCOMYCIN  1 SENSITIVE Sensitive     TRIMETH/SULFA 20 SENSITIVE Sensitive     CLINDAMYCIN  >=8 RESISTANT Resistant     RIFAMPIN  8 RESISTANT Resistant     Inducible Clindamycin  NEGATIVE Sensitive     * STAPHYLOCOCCUS EPIDERMIDIS  Blood Culture ID Panel (Reflexed)     Status: Abnormal   Collection Time: 04/18/24  4:46 PM  Result Value Ref Range Status   Enterococcus faecalis NOT DETECTED NOT DETECTED Final   Enterococcus Faecium NOT DETECTED NOT DETECTED Final   Listeria monocytogenes NOT DETECTED NOT DETECTED Final   Staphylococcus species DETECTED (A) NOT DETECTED Final    Comment: CRITICAL RESULT CALLED TO, READ BACK BY AND VERIFIED WITH: PHARMD N. GLOGOVAC [id number] @ 1650 FH    Staphylococcus aureus (BCID) NOT DETECTED NOT DETECTED Final   Staphylococcus epidermidis DETECTED (A) NOT DETECTED Final    Comment: Methicillin (oxacillin) resistant coagulase negative staphylococcus. Possible blood culture contaminant (unless isolated from more than one blood culture draw or clinical case suggests  pathogenicity). No antibiotic treatment is indicated for blood  culture contaminants. CRITICAL RESULT CALLED TO, READ BACK BY AND VERIFIED WITH: PHARMD N. GLOGOVAC  [id number] @ 1650 FH    Staphylococcus lugdunensis NOT DETECTED NOT DETECTED Final   Streptococcus species NOT DETECTED NOT DETECTED Final   Streptococcus agalactiae NOT DETECTED NOT DETECTED Final   Streptococcus pneumoniae NOT DETECTED NOT DETECTED Final   Streptococcus pyogenes NOT DETECTED NOT DETECTED Final   A.calcoaceticus-baumannii NOT DETECTED NOT DETECTED Final   Bacteroides fragilis NOT DETECTED NOT DETECTED Final   Enterobacterales NOT DETECTED NOT DETECTED Final   Enterobacter cloacae complex NOT DETECTED NOT DETECTED Final   Escherichia coli NOT DETECTED NOT DETECTED Final   Klebsiella aerogenes NOT DETECTED NOT DETECTED Final   Klebsiella oxytoca NOT DETECTED NOT DETECTED Final   Klebsiella pneumoniae NOT DETECTED NOT DETECTED Final   Proteus species NOT DETECTED NOT DETECTED Final   Salmonella species NOT DETECTED NOT DETECTED Final   Serratia marcescens NOT DETECTED NOT DETECTED Final   Haemophilus influenzae NOT DETECTED NOT DETECTED Final   Neisseria meningitidis NOT DETECTED NOT DETECTED Final   Pseudomonas aeruginosa NOT DETECTED NOT DETECTED Final   Stenotrophomonas maltophilia NOT DETECTED NOT DETECTED Final   Candida albicans NOT DETECTED NOT DETECTED Final   Candida auris NOT DETECTED NOT DETECTED Final   Candida glabrata NOT DETECTED NOT DETECTED Final   Candida krusei NOT DETECTED NOT DETECTED Final   Candida parapsilosis NOT DETECTED NOT DETECTED Final   Candida tropicalis NOT DETECTED NOT DETECTED Final   Cryptococcus neoformans/gattii NOT DETECTED NOT DETECTED Final   Methicillin resistance mecA/C DETECTED (A) NOT DETECTED Final    Comment: CRITICAL RESULT CALLED TO, READ BACK BY AND VERIFIED WITH: PHARMD N. GLOGOVAC [id number] @ 1650 FH Performed at [facility], [address] [place], [place] [address]   Culture, blood (routine x 2)     Status: Abnormal   Collection Time: 04/18/24  4:51 PM   Specimen: BLOOD LEFT ARM  Result Value Ref Range Status    Specimen Description   Final    BLOOD LEFT ARM Performed at [facility], [address], [place], [place] [address]    Special Requests   Final    BOTTLES DRAWN AEROBIC AND ANAEROBIC Blood Culture adequate volume Performed at [facility], [address] [place], [place] [address]    Culture  Setup Time   Final    GRAM POSITIVE COCCI IN CLUSTERS IN BOTH AEROBIC AND ANAEROBIC BOTTLES CRITICAL RESULT CALLED TO, READ BACK BY AND VERIFIED WITH: PHARMD N. GLOGOVAC [id number] @ 1650 FH Performed at [facility], [address], [place], [place] [address]    Culture STAPHYLOCOCCUS EPIDERMIDIS (A)  Final   Report Status 04/21/2024 FINAL  Final   Organism ID, Bacteria STAPHYLOCOCCUS EPIDERMIDIS  Final      Susceptibility   Staphylococcus epidermidis - MIC*    CIPROFLOXACIN <=0.5 SENSITIVE Sensitive     ERYTHROMYCIN >=8 RESISTANT Resistant     GENTAMICIN <=0.5 SENSITIVE Sensitive     OXACILLIN >=4 RESISTANT Resistant     TETRACYCLINE 2 SENSITIVE Sensitive     VANCOMYCIN  2 SENSITIVE Sensitive     TRIMETH/SULFA 20 SENSITIVE Sensitive     CLINDAMYCIN  >=8 RESISTANT Resistant     RIFAMPIN  16 RESISTANT Resistant     Inducible Clindamycin  NEGATIVE Sensitive     * STAPHYLOCOCCUS EPIDERMIDIS  Resp panel by RT-PCR (RSV, Flu A&B, Covid) Anterior Nasal Swab  Status: None   Collection Time: 04/19/24  1:22 AM   Specimen: Anterior Nasal Swab  Result Value Ref Range Status   SARS Coronavirus 2 by RT PCR NEGATIVE NEGATIVE Final    Comment: (NOTE) SARS-CoV-2 target nucleic acids are NOT DETECTED.  The SARS-CoV-2 RNA is generally detectable in upper respiratory specimens during the acute phase of infection. The lowest concentration of SARS-CoV-2 viral copies this assay can detect is 138 copies/mL. A negative result does not preclude SARS-Cov-2 infection and should not be used as the sole basis for treatment or other patient management decisions. A negative result may occur  with  improper specimen collection/handling, submission of specimen other than nasopharyngeal swab, presence of viral mutation(s) within the areas targeted by this assay, and inadequate number of viral copies(<138 copies/mL). A negative result must be combined with clinical observations, patient history, and epidemiological information. The expected result is Negative.  Fact Sheet for Patients:  BloggerCourse.com  Fact Sheet for Healthcare Providers:  SeriousBroker.it  This test is no t yet approved or cleared by the [place]  FDA and  has been authorized for detection and/or diagnosis of SARS-CoV-2 by FDA under an Emergency Use Authorization (EUA). This EUA will remain  in effect (meaning this test can be used) for the duration of the COVID-19 declaration under Section 564(b)(1) of the Act, 21 U.S.C.section 360bbb-3(b)(1), unless the authorization is terminated  or revoked sooner.       Influenza A by PCR NEGATIVE NEGATIVE Final   Influenza B by PCR NEGATIVE NEGATIVE Final    Comment: (NOTE) The Xpert Xpress SARS-CoV-2/FLU/RSV plus assay is intended as an aid in the diagnosis of influenza from Nasopharyngeal swab specimens and should not be used as a sole basis for treatment. Nasal washings and aspirates are unacceptable for Xpert Xpress SARS-CoV-2/FLU/RSV testing.  Fact Sheet for Patients: BloggerCourse.com  Fact Sheet for Healthcare Providers: SeriousBroker.it  This test is not yet approved or cleared by the [place]  FDA and has been authorized for detection and/or diagnosis of SARS-CoV-2 by FDA under an Emergency Use Authorization (EUA). This EUA will remain in effect (meaning this test can be used) for the duration of the COVID-19 declaration under Section 564(b)(1) of the Act, 21 U.S.C. section 360bbb-3(b)(1), unless the authorization is terminated  or revoked.     Resp Syncytial Virus by PCR NEGATIVE NEGATIVE Final    Comment: (NOTE) Fact Sheet for Patients: BloggerCourse.com  Fact Sheet for Healthcare Providers: SeriousBroker.it  This test is not yet approved or cleared by the [place]  FDA and has been authorized for detection and/or diagnosis of SARS-CoV-2 by FDA under an Emergency Use Authorization (EUA). This EUA will remain in effect (meaning this test can be used) for the duration of the COVID-19 declaration under Section 564(b)(1) of the Act, 21 U.S.C. section 360bbb-3(b)(1), unless the authorization is terminated or revoked.  Performed at [facility], [address] [place], [place] [address]   MRSA Next Gen by PCR, Nasal     Status: None   Collection Time: 04/19/24  1:22 AM   Specimen: Anterior Nasal Swab  Result Value Ref Range Status   MRSA by PCR Next Gen NOT DETECTED NOT DETECTED Final    Comment: (NOTE) The GeneXpert MRSA Assay (FDA approved for NASAL specimens only), is one component of a comprehensive MRSA colonization surveillance program. It is not intended to diagnose MRSA infection nor to guide or monitor treatment for MRSA infections. Test performance is not FDA approved in patients  less than 57 years old. Performed at [facility], [address], [place], [place] [address]   Respiratory (~20 pathogens) panel by PCR     Status: None   Collection Time: 04/19/24  9:00 AM   Specimen: Nasopharyngeal Swab; Respiratory  Result Value Ref Range Status   Adenovirus NOT DETECTED NOT DETECTED Final   Coronavirus 229E NOT DETECTED NOT DETECTED Final    Comment: (NOTE) The Coronavirus on the Respiratory Panel, DOES NOT test for the novel  Coronavirus (2019 nCoV)    Coronavirus HKU1 NOT DETECTED NOT DETECTED Final   Coronavirus NL63 NOT DETECTED NOT DETECTED Final   Coronavirus OC43 NOT DETECTED NOT DETECTED  Final   Metapneumovirus NOT DETECTED NOT DETECTED Final   Rhinovirus / Enterovirus NOT DETECTED NOT DETECTED Final   Influenza A NOT DETECTED NOT DETECTED Final   Influenza B NOT DETECTED NOT DETECTED Final   Parainfluenza Virus 1 NOT DETECTED NOT DETECTED Final   Parainfluenza Virus 2 NOT DETECTED NOT DETECTED Final   Parainfluenza Virus 3 NOT DETECTED NOT DETECTED Final   Parainfluenza Virus 4 NOT DETECTED NOT DETECTED Final   Respiratory Syncytial Virus NOT DETECTED NOT DETECTED Final   Bordetella pertussis NOT DETECTED NOT DETECTED Final   Bordetella Parapertussis NOT DETECTED NOT DETECTED Final   Chlamydophila pneumoniae NOT DETECTED NOT DETECTED Final   Mycoplasma pneumoniae NOT DETECTED NOT DETECTED Final    Comment: Performed at [facility], [address], [place], [place] [address]  Culture, blood (Routine X 2) w Reflex to ID Panel     Status: None (Preliminary result)   Collection Time: 04/21/24 12:57 PM   Specimen: BLOOD  Result Value Ref Range Status   Specimen Description   Final    BLOOD BLOOD LEFT HAND Performed at [facility], [address], [place], [place] [address]    Special Requests   Final    BOTTLES DRAWN AEROBIC ONLY Blood Culture results may not be optimal due to an inadequate volume of blood received in culture bottles Performed at [facility], [address] [place], [place] [address]    Culture   Final    NO GROWTH 2 DAYS Performed at [facility], [address], [place], [place] [address]    Report Status PENDING  Incomplete  Culture, blood (Routine X 2) w Reflex to ID Panel     Status: None (Preliminary result)   Collection Time: 04/21/24 12:57 PM   Specimen: BLOOD  Result Value Ref Range Status   Specimen Description   Final    BLOOD BLOOD LEFT HAND Performed at [facility], [address] [place], [place] [address]    Special Requests   Final    BOTTLES DRAWN AEROBIC  ONLY Blood Culture results may not be optimal due to an inadequate volume of blood received in culture bottles Performed at [facility], [address] [place], [place] [address]    Culture   Final    NO GROWTH 2 DAYS Performed at [facility], [address] [place], [place] [address]    Report Status PENDING  Incomplete     Radiology Studies: ECHOCARDIOGRAM COMPLETE Result Date: 04/22/2024    ECHOCARDIOGRAM REPORT   Patient Name:   Hannah Khan Date of Exam: 04/22/2024 Medical Rec #:  [id number]       Height:       67.0 in Accession #:  [id number]      Weight:       300.7 lb Date of Birth:  Mar 22, 1956       BSA:          2.405 m Patient Age:    68 years        BP:           125/62 mmHg Patient Gender: F               HR:           92 bpm. Exam Location:  Inpatient Procedure: 2D Echo, Cardiac Doppler, Color Doppler and Intracardiac            Opacification Agent (Both Spectral and Color Flow Doppler were            utilized during procedure). Indications:    Bacteremia  History:        Patient has prior history of Echocardiogram examinations, most                 recent 03/07/2023. CHF, Signs/Symptoms:Shortness of Breath and                 Hypotension; Risk Factors:Hypertension, Sleep Apnea and                 Diabetes.  Sonographer:    Philomena Daring Referring Phys: [id number] DONALD BERLIN  Sonographer Comments: Technically difficult study due to poor echo windows, suboptimal subcostal window and patient is obese. Image acquisition challenging due to patient body habitus. IMPRESSIONS  1. Left ventricular ejection fraction, by estimation, is 60 to 65%. The left ventricle has normal function. The left ventricle has no regional wall motion abnormalities. Left ventricular diastolic function could not be evaluated.  2. Right ventricular systolic function is mildly reduced. The right ventricular size is mildly enlarged. There is mildly elevated pulmonary artery systolic  pressure.  3. Left atrial size was moderately dilated.  4. Right atrial size was mildly dilated.  5. The mitral valve is grossly normal. Trivial mitral valve regurgitation. No evidence of mitral stenosis.  6. The aortic valve has an indeterminant number of cusps. There is moderate calcification of the aortic valve. There is moderate thickening of the aortic valve. Aortic valve regurgitation is not visualized. Moderate aortic valve stenosis.  7. The inferior vena cava is dilated in size with <50% respiratory variability, suggesting right atrial pressure of 15 mmHg. Conclusion(s)/Recommendation(s): No significant vegetations seen, but poor quality study. Consider TEE if high risk organism or high clinical suspicion. FINDINGS  Left Ventricle: Left ventricular ejection fraction, by estimation, is 60 to 65%. The left ventricle has normal function. The left ventricle has no regional wall motion abnormalities. The left ventricular internal cavity size was normal in size. There is  no left ventricular hypertrophy. Left ventricular diastolic function could not be evaluated due to atrial fibrillation. Left ventricular diastolic function could not be evaluated. Right Ventricle: The right ventricular size is mildly enlarged. Right vetricular wall thickness was not well visualized. Right ventricular systolic function is mildly reduced. There is mildly elevated pulmonary artery systolic pressure. The tricuspid regurgitant velocity is 2.55 m/s, and with an assumed right atrial pressure of 15 mmHg, the estimated right ventricular systolic pressure is 41.0 mmHg. Left Atrium: Left atrial size was moderately dilated. Right Atrium: Right atrial size was mildly dilated. Pericardium: There is no evidence of pericardial effusion. Mitral Valve: The mitral valve is grossly normal. Trivial mitral valve regurgitation. No evidence of mitral valve  stenosis. Tricuspid Valve: The tricuspid valve is normal in structure. Tricuspid valve  regurgitation is mild . No evidence of tricuspid stenosis. Aortic Valve: The aortic valve has an indeterminant number of cusps. There is moderate calcification of the aortic valve. There is moderate thickening of the aortic valve. Aortic valve regurgitation is not visualized. Moderate aortic stenosis is present.  Aortic valve mean gradient measures 20.0 mmHg. Aortic valve peak gradient measures 40.4 mmHg. Pulmonic Valve: The pulmonic valve was not well visualized. Pulmonic valve regurgitation is not visualized. No evidence of pulmonic stenosis. Aorta: The aortic root is normal in size and structure. Venous: The inferior vena cava is dilated in size with less than 50% respiratory variability, suggesting right atrial pressure of 15 mmHg. IAS/Shunts: No atrial level shunt detected by color flow Doppler.  LEFT VENTRICLE PLAX 2D LVIDd:         5.40 cm   Diastology LVIDs:         3.70 cm   LV e' medial:    10.60 cm/s LV PW:         1.10 cm   LV E/e' medial:  14.8 LV IVS:        1.20 cm   LV e' lateral:   11.70 cm/s LVOT diam:     1.90 cm   LV E/e' lateral: 13.4 LVOT Area:     2.84 cm  RIGHT VENTRICLE             IVC RV S prime:     12.40 cm/s  IVC diam: 4.00 cm TAPSE (M-mode): 1.8 cm LEFT ATRIUM             Index LA Vol (A2C):   86.9 ml 36.13 ml/m LA Vol (A4C):   96.8 ml 40.25 ml/m LA Biplane Vol: 97.3 ml 40.46 ml/m  AORTIC VALVE AV Vmax:      318.00 cm/s AV Vmean:     209.000 cm/s AV VTI:       0.546 m AV Peak Grad: 40.4 mmHg AV Mean Grad: 20.0 mmHg  AORTA Ao Root diam: 3.00 cm Ao Asc diam:  3.50 cm MITRAL VALVE                TRICUSPID VALVE MV Area (PHT): 3.44 cm     TR Peak grad:   26.0 mmHg MV E velocity: 157.00 cm/s  TR Vmax:        255.00 cm/s                              SHUNTS                             Systemic Diam: 1.90 cm Morene Brownie Electronically signed by Morene Brownie Signature Date/Time: 04/22/2024/5:20:09 PM    Final    DG Chest 2 View Result Date: 04/21/2024 EXAM: 2 VIEW(S) XRAY OF THE  CHEST 04/21/2024 05:51:00 PM COMPARISON: 04/18/2024 CLINICAL HISTORY: [id number] Pneumonia [id number]. Per order: pneumonia. Pt currently being treated for erythema in her lower extremity and swelling. FINDINGS: LUNGS AND PLEURA: Basilar airspace opacities and small left pleural effusion. No pneumothorax. Pulmonary vascular congestion. HEART AND MEDIASTINUM: Stable cardiomegaly. Aortic atherosclerotic calcification. BONES AND SOFT TISSUES: No acute osseous abnormality. IMPRESSION: 1. Left basilar airspace opacities and small left pleural effusion. Findings are similar to Apr 18 2024 and remain suspicious for pneumonia. Electronically signed by: Norman Gatlin MD 04/21/2024  08:24 PM EDT RP Workstation: [id number]    Scheduled Meds:  sodium chloride    Intravenous Once   Chlorhexidine  Gluconate Cloth  6 each Topical QHS   digoxin   125 mcg Oral Daily   doxycycline   100 mg Oral Q12H   insulin  aspart  0-15 Units Subcutaneous TID WC   insulin  aspart  0-5 Units Subcutaneous QHS   insulin  aspart  3 Units Subcutaneous TID WC   insulin  glargine-yfgn  24 Units Subcutaneous Daily   lactulose   20 g Oral TID   metoprolol  tartrate  50 mg Oral BID   midodrine   10 mg Oral TID with meals   pantoprazole   40 mg Oral BID   rifaximin   550 mg Oral BID   sodium chloride  flush  3 mL Intravenous Q12H   torsemide   40 mg Oral Daily   torsemide   80 mg Oral Daily   Continuous Infusions:  DAPTOmycin  Stopped (04/22/24 1534)   famotidine  (PEPCID ) IV Stopped (04/23/24 1013)     LOS: 4 days   Ivonne Mustache, MD [facility] P8/01/2024, 12:50 PM

## 2024-04-24 ENCOUNTER — Other Ambulatory Visit (HOSPITAL_COMMUNITY): Payer: Self-pay

## 2024-04-24 DIAGNOSIS — R7881 Bacteremia: Secondary | ICD-10-CM | POA: Diagnosis not present

## 2024-04-24 DIAGNOSIS — B957 Other staphylococcus as the cause of diseases classified elsewhere: Secondary | ICD-10-CM | POA: Diagnosis not present

## 2024-04-24 LAB — CBC
HCT: 24.3 % — ABNORMAL LOW (ref 36.0–46.0)
Hemoglobin: 7.4 g/dL — ABNORMAL LOW (ref 12.0–15.0)
MCH: 28.5 pg (ref 26.0–34.0)
MCHC: 30.5 g/dL (ref 30.0–36.0)
MCV: 93.5 fL (ref 80.0–100.0)
Platelets: 31 K/uL — ABNORMAL LOW (ref 150–400)
RBC: 2.6 MIL/uL — ABNORMAL LOW (ref 3.87–5.11)
RDW: 19.6 % — ABNORMAL HIGH (ref 11.5–15.5)
WBC: 3.8 K/uL — ABNORMAL LOW (ref 4.0–10.5)
nRBC: 0 % (ref 0.0–0.2)

## 2024-04-24 LAB — BASIC METABOLIC PANEL WITH GFR
Anion gap: 10 (ref 5–15)
BUN: 46 mg/dL — ABNORMAL HIGH (ref 8–23)
CO2: 25 mmol/L (ref 22–32)
Calcium: 8.2 mg/dL — ABNORMAL LOW (ref 8.9–10.3)
Chloride: 99 mmol/L (ref 98–111)
Creatinine, Ser: 1.59 mg/dL — ABNORMAL HIGH (ref 0.44–1.00)
GFR, Estimated: 35 mL/min — ABNORMAL LOW (ref >60)
Glucose, Bld: 106 mg/dL — ABNORMAL HIGH (ref 70–99)
Potassium: 3.1 mmol/L — ABNORMAL LOW (ref 3.5–5.1)
Sodium: 134 mmol/L — ABNORMAL LOW (ref 135–145)

## 2024-04-24 LAB — GLUCOSE, CAPILLARY
Glucose-Capillary: 110 mg/dL — ABNORMAL HIGH (ref 70–99)
Glucose-Capillary: 219 mg/dL — ABNORMAL HIGH (ref 70–99)

## 2024-04-24 LAB — HEMOGLOBIN A1C

## 2024-04-24 MED ORDER — TORSEMIDE 20 MG PO TABS: ORAL_TABLET | ORAL | Status: AC

## 2024-04-24 MED ORDER — POTASSIUM CHLORIDE CRYS ER 20 MEQ PO TBCR
40.0000 meq | EXTENDED_RELEASE_TABLET | Freq: Once | ORAL | Status: DC
  Filled 2024-04-24: qty 2

## 2024-04-24 MED ORDER — POTASSIUM CHLORIDE ER 10 MEQ PO CPCR: 40.0000 meq | ORAL_CAPSULE | Freq: Two times a day (BID) | ORAL | Status: AC

## 2024-04-24 MED ORDER — DEXTROSE 5 % IV SOLN
1500.0000 mg | Freq: Once | INTRAVENOUS | Status: AC
  Administered 2024-04-24: 1500 mg via INTRAVENOUS
  Filled 2024-04-24: qty 75

## 2024-04-24 MED ORDER — METOPROLOL TARTRATE 50 MG PO TABS
50.0000 mg | ORAL_TABLET | Freq: Two times a day (BID) | ORAL | 0 refills | Status: DC
  Filled 2024-04-24: qty 60, 30d supply, fill #0

## 2024-04-24 NOTE — Discharge Summary (Signed)
 Physician Discharge Summary  Hannah Khan FMW:[id number] DOB: 09/14/1956 DOA: 04/18/2024  PCP: Regino Slater, MD  Admit date: 04/18/2024 Discharge date: 04/24/2024  Admitted From: Home Disposition:  Home  Discharge Condition:Stable CODE STATUS:FULL Diet recommendation: Low sodium diet  Brief/Interim Summary: Patient is a 68 year old female with history of morbid obesity, Nash cirrhosis, variceal bleeding status post BRTO, failed TIPS, HFpEF, A-fib not on anticoagulation, CKD stage IIIb, diabetes type 2, anemia, thrombocytopenia, OSA on CPAP who presented here with complaint of left thigh redness, low blood pressure.  She was found to have sepsis secondary to left thigh cellulitis also found to be in A-fib with RVR.  Started on Cardizem  drip.  Started on broad-spectrum antibiotics for sepsis/cellulitis.  Blood cultures showed Staph epidermidis in 4 out of 4 bottles.  ID consulted and started on daptomycin .  Repeat blood cultures have been sent, no growth so far. ID recommended one dose of dalbavancin,no further need of antibiotics.She is medically stable for dc to home today.   Following problems were addressed during the hospitalization:    Bacteremia/sepsis secondary to left lower extremity cellulitis/possible left sided pneumonia: Presented with fever, tachycardia, tachypnea.  Initial lactic acid was 2.6.  Chest x-ray showed retrocardiac opacity but patient without respiratory symptoms.  Had cellulitis on the left lower extremity.  Blood cultures showed Staph epidermidis in 4 out of 4 bottles.  Improving sepsis physiology.  She was on doxycycline , daptomycin .  Repeat blood cultures sent on 8/3, no growth till date.  2D echo did not show any vegetations but was a poor quality study, showed normal EF.  No need for TEE.  ID recommended a dose of dalbavancin before discharge, no need for further antibiotics   Persistent A-fib with RVR: Was in RVR on presentation.  Currently has resolved.  On  metoprolol , digoxin .  TSH normal.  Not on anticoagulation due to history of GI bleed.   Acute on chronic blood loss anemia: Currently hemoglobin stable.  Given 2 units of blood transfusion during the hospitalization.  On Protonix .  GI was following here.  Recommended outpatient EGD.  Currently hemoglobin stable in the range of 8.  Also has stable thrombocytopenia secondary to NASH   NASH cirrhosis: Currently compensated.  No encephalopathy.  MELD-NA score 25.  Currently on lactulose , rifaximin .  Torsemide  restarted.  Follow-up with GI as an outpatient   AKI in CKD stage IIIb: Baseline creatinine ranges from 1.7-2.  AKI has improved and currently kidney function at baseline.  Continue current diuretics.   Insulin -dependent diabetes type 2: Recent A1c of 6.  Continue home regimen  OSA: On CPAP   Hyponatremia: Improved   Morbid obesity: BMI of 46.2     Discharge Diagnoses:  Principal Problem:   Staphylococcus epidermidis bacteremia Active Problems:   Obstructive sleep apnea   Essential hypertension   Aortic heart murmur   Permanent atrial fibrillation (HCC)   Thrombocytopenia (HCC)   Sepsis (HCC)   Type 2 diabetes mellitus (HCC)   Severe sepsis (HCC)   Cellulitis of left lower extremity    Discharge Instructions  Discharge Instructions     Diet Carb Modified   Complete by: As directed    Discharge instructions   Complete by: As directed    1)Please take your medications as instructed 2)Follow up with your PCP in a week.  Do a CBC, BMP tests during the follow-up 3)Follo up with your gastroenterologist.   Increase activity slowly   Complete by: As directed    No wound  care   Complete by: As directed       Allergies as of 04/24/2024       Reactions   Celebrex [celecoxib] Other (See Comments)   speech slurred with celebrex   Shellfish Allergy Swelling, Rash, Other (See Comments)   TINGLING AND SWELLING OF LIPS. LARGE WHELPS, QUARTER-SIZE   Barium-containing  Compounds Other (See Comments)   TACHYCARDIA   Fentanyl  Other (See Comments)   Hallucinations    Nsaids Other (See Comments)   Contraindication due to CKD stage 3    Prednisone Other (See Comments)   TACHYCARDIA   Sglt2 Inhibitors Other (See Comments)   Ozempic , Severe gastroparesis    Statins Other (See Comments)   AGGRAVATED FIBROMYALGIA   Tylenol  [acetaminophen ] Other (See Comments)   UNSPECIFIED SPECIFIC REACTION >> DUE TO PLATELETS   Ibuprofen  Other (See Comments)   UNSPECIFIED SPECIFIC REACTION >> DUE TO PLATELETS   Tramadol  Other (See Comments)   Hallucination   Zetia [ezetimibe] Other (See Comments)   Headache        Medication List     TAKE these medications    albuterol  108 (90 Base) MCG/ACT inhaler Commonly known as: VENTOLIN  HFA Inhale 1-2 puffs into the lungs every 6 (six) hours as needed for wheezing or shortness of breath.   digoxin  0.125 MG tablet Commonly known as: LANOXIN  TAKE 1 TABLET BY MOUTH EVERY DAY   FreeStyle Libre 3 Plus Sensor Misc Apply 1 each topically as directed. Place on arm as directed and change every 15 days.   lactulose  10 GM/15ML solution Commonly known as: CHRONULAC  Take 20 g by mouth 3 (three) times daily.   metolazone  2.5 MG tablet Commonly known as: ZAROXOLYN  TAKE 1 TABLET (2.5 MG TOTAL) BY MOUTH AS NEEDED (AS DIRECTED BY HF CLINIC). What changed: reasons to take this   metoprolol  tartrate 50 MG tablet Commonly known as: LOPRESSOR  Take 1 tablet (50 mg total) by mouth 2 (two) times daily. What changed:  medication strength how much to take   midodrine  10 MG tablet Commonly known as: PROAMATINE  Take 1 tablet (10 mg total) by mouth 3 (three) times daily.   OneTouch Verio test strip Generic drug: glucose blood 1 each 3 (three) times daily.   pantoprazole  40 MG tablet Commonly known as: PROTONIX  Take 40 mg by mouth 2 (two) times daily.   potassium chloride  10 MEQ CR capsule Commonly known as: MICRO-K  Take  4 capsules (40 mEq total) by mouth 2 (two) times daily. Take an extra 4 tablets when taking Metolazone .   PreserVision AREDS 2 Caps Take 1 capsule by mouth daily.   spironolactone  50 MG tablet Commonly known as: ALDACTONE  Take 50 mg by mouth daily. Total of 75 mg in am and pm What changed: Another medication with the same name was changed. Make sure you understand how and when to take each.   spironolactone  25 MG tablet Commonly known as: ALDACTONE  TAKE 3 TABLETS BY MOUTH DAILY What changed:  how much to take additional instructions   Systane Balance 0.6 % Soln Generic drug: Propylene Glycol Place 1 drop into both eyes 2 (two) times daily.   torsemide  20 MG tablet Commonly known as: DEMADEX  Take 4 tablets (80 mg total) by mouth in the morning and 2 tablets (40 mg total) in the evening   Tresiba  FlexTouch 100 UNIT/ML FlexTouch Pen Generic drug: insulin  degludec Inject 32 Units into the skin daily. (TITRATE WHEN DIRECTED UP TO 50 UNITS)   Xifaxan  550 MG Tabs tablet  Generic drug: rifaximin  Take 550 mg by mouth 2 (two) times daily.        Follow-up Information     [facility] Follow up.   Specialty: Home Health Services Why: [facility] RN/PT Contact information: [address] [place] [address] [phone]         Regino Slater, MD. Schedule an appointment as soon as possible for a visit in 1 week(s).   Specialty: Family Medicine Contact information: [address] [place] [address] [phone]                Allergies  Allergen Reactions   Celebrex [Celecoxib] Other (See Comments)    speech slurred with celebrex   Shellfish Allergy Swelling, Rash and Other (See Comments)    TINGLING AND SWELLING OF LIPS. LARGE WHELPS, QUARTER-SIZE   Barium-Containing Compounds Other (See Comments)    TACHYCARDIA   Fentanyl  Other (See Comments)    Hallucinations    Nsaids Other (See Comments)     Contraindication due to CKD stage 3    Prednisone Other (See Comments)    TACHYCARDIA   Sglt2 Inhibitors Other (See Comments)    Ozempic , Severe gastroparesis    Statins Other (See Comments)    AGGRAVATED FIBROMYALGIA   Tylenol  [Acetaminophen ] Other (See Comments)    UNSPECIFIED SPECIFIC REACTION >> DUE TO PLATELETS   Ibuprofen  Other (See Comments)    UNSPECIFIED SPECIFIC REACTION >> DUE TO PLATELETS   Tramadol  Other (See Comments)    Hallucination    Zetia [Ezetimibe] Other (See Comments)    Headache    Consultations:    Procedures/Studies: ECHOCARDIOGRAM COMPLETE Result Date: 04/22/2024    ECHOCARDIOGRAM REPORT   Patient Name:   JERMIYA REICHL Date of Exam: 04/22/2024 Medical Rec #:  [id number]       Height:       67.0 in Accession #:    [id number]      Weight:       300.7 lb Date of Birth:  [birth year]       BSA:          2.405 m Patient Age:    68 years        BP:           125/62 mmHg Patient Gender: F               HR:           92 bpm. Exam Location:  Inpatient Procedure: 2D Echo, Cardiac Doppler, Color Doppler and Intracardiac            Opacification Agent (Both Spectral and Color Flow Doppler were            utilized during procedure). Indications:    Bacteremia  History:        Patient has prior history of Echocardiogram examinations, most                 recent 03/07/2023. CHF, Signs/Symptoms:Shortness of Breath and                 Hypotension; Risk Factors:Hypertension, Sleep Apnea and                 Diabetes.  Sonographer:    Philomena Daring Referring Phys: [id number] DONALD BERLIN  Sonographer Comments: Technically difficult study due to poor echo windows, suboptimal subcostal window and patient is obese. Image acquisition challenging due to patient body habitus. IMPRESSIONS  1. Left ventricular ejection fraction, by estimation, is 60 to 65%. The left ventricle has normal function. The left ventricle has no regional wall motion abnormalities. Left ventricular diastolic  function could not be evaluated.  2. Right ventricular systolic function is mildly reduced. The right ventricular size is mildly enlarged. There is mildly elevated pulmonary artery systolic pressure.  3. Left atrial size was moderately dilated.  4. Right atrial size was mildly dilated.  5. The mitral valve is grossly normal. Trivial mitral valve regurgitation. No evidence of mitral stenosis.  6. The aortic valve has an indeterminant number of cusps. There is moderate calcification of the aortic valve. There is moderate thickening of the aortic valve. Aortic valve regurgitation is not visualized. Moderate aortic valve stenosis.  7. The inferior vena cava is dilated in size with <50% respiratory variability, suggesting right atrial pressure of 15 mmHg. Conclusion(s)/Recommendation(s): No significant vegetations seen, but poor quality study. Consider TEE if high risk organism or high clinical suspicion. FINDINGS  Left Ventricle: Left ventricular ejection fraction, by estimation, is 60 to 65%. The left ventricle has normal function. The left ventricle has no regional wall motion abnormalities. The left ventricular internal cavity size was normal in size. There is  no left ventricular hypertrophy. Left ventricular diastolic function could not be evaluated due to atrial fibrillation. Left ventricular diastolic function could not be evaluated. Right Ventricle: The right ventricular size is mildly enlarged. Right vetricular wall thickness was not well visualized. Right ventricular systolic function is mildly reduced. There is mildly elevated pulmonary artery systolic pressure. The tricuspid regurgitant velocity is 2.55 m/s, and with an assumed right atrial pressure of 15 mmHg, the estimated right ventricular systolic pressure is 41.0 mmHg. Left Atrium: Left atrial size was moderately dilated. Right Atrium: Right atrial size was mildly dilated. Pericardium: There is no evidence of pericardial effusion. Mitral Valve: The  mitral valve is grossly normal. Trivial mitral valve regurgitation. No evidence of mitral valve stenosis. Tricuspid Valve: The tricuspid valve is normal in structure. Tricuspid valve regurgitation is mild . No evidence of tricuspid stenosis. Aortic Valve: The aortic valve has an indeterminant number of cusps. There is moderate calcification of the aortic valve. There is moderate thickening of the aortic valve. Aortic valve regurgitation is not visualized. Moderate aortic stenosis is present.  Aortic valve mean gradient measures 20.0 mmHg. Aortic valve peak gradient measures 40.4 mmHg. Pulmonic Valve: The pulmonic valve was not well visualized. Pulmonic valve regurgitation is not visualized. No evidence of pulmonic stenosis. Aorta: The aortic root is normal in size and structure. Venous: The inferior vena cava is dilated in size with less than 50% respiratory variability, suggesting right atrial pressure of 15 mmHg. IAS/Shunts: No atrial level shunt detected by color flow Doppler.  LEFT VENTRICLE PLAX 2D LVIDd:         5.40 cm   Diastology LVIDs:         3.70 cm   LV e' medial:    10.60 cm/s LV PW:         1.10 cm   LV E/e' medial:  14.8 LV IVS:        1.20 cm   LV e' lateral:   11.70 cm/s LVOT diam:     1.90 cm   LV E/e' lateral: 13.4 LVOT Area:     2.84 cm  RIGHT VENTRICLE             IVC RV S prime:     12.40 cm/s  IVC diam:  4.00 cm TAPSE (M-mode): 1.8 cm LEFT ATRIUM             Index LA Vol (A2C):   86.9 ml 36.13 ml/m LA Vol (A4C):   96.8 ml 40.25 ml/m LA Biplane Vol: 97.3 ml 40.46 ml/m  AORTIC VALVE AV Vmax:      318.00 cm/s AV Vmean:     209.000 cm/s AV VTI:       0.546 m AV Peak Grad: 40.4 mmHg AV Mean Grad: 20.0 mmHg  AORTA Ao Root diam: 3.00 cm Ao Asc diam:  3.50 cm MITRAL VALVE                TRICUSPID VALVE MV Area (PHT): 3.44 cm     TR Peak grad:   26.0 mmHg MV E velocity: 157.00 cm/s  TR Vmax:        255.00 cm/s                              SHUNTS                             Systemic Diam: 1.90 cm  Morene Brownie Electronically signed by Morene Brownie Signature Date/Time: 04/22/2024/5:20:09 PM    Final    DG Chest 2 View Result Date: 04/21/2024 EXAM: 2 VIEW(S) XRAY OF THE CHEST 04/21/2024 05:51:00 PM COMPARISON: 04/18/2024 CLINICAL HISTORY: [id number] Pneumonia [id number]. Per order: pneumonia. Pt currently being treated for erythema in her lower extremity and swelling. FINDINGS: LUNGS AND PLEURA: Basilar airspace opacities and small left pleural effusion. No pneumothorax. Pulmonary vascular congestion. HEART AND MEDIASTINUM: Stable cardiomegaly. Aortic atherosclerotic calcification. BONES AND SOFT TISSUES: No acute osseous abnormality. IMPRESSION: 1. Left basilar airspace opacities and small left pleural effusion. Findings are similar to Apr 18 2024 and remain suspicious for pneumonia. Electronically signed by: Norman Gatlin MD 04/21/2024 08:24 PM EDT RP Workstation: [id number]   DG Chest 1 View Result Date: 04/18/2024 CLINICAL DATA:  Shortness of breath EXAM: CHEST  1 VIEW COMPARISON:  Chest radiograph dated 02/05/2024 FINDINGS: Normal lung volumes. Chronic-appearing pulmonary vascular congestion. Patchy left retrocardiac opacity. Unchanged blunting of the left costophrenic angle. No pneumothorax. Similar enlarged cardiomediastinal silhouette. No acute osseous abnormality. IMPRESSION: 1. Patchy left retrocardiac opacity, which may represent atelectasis, aspiration, or pneumonia. 2. Chronic-appearing pulmonary vascular congestion. 3. Unchanged blunting of the left costophrenic angle, which may represent a small pleural effusion. Electronically Signed   By: Limin  Xu M.D.   On: 04/18/2024 18:51   US  Venous Img Lower  Left (DVT Study) Result Date: 04/18/2024 CLINICAL DATA:  Leg pain with medial thigh redness. EXAM: LEFT LOWER EXTREMITY VENOUS DOPPLER ULTRASOUND TECHNIQUE: Gray-scale sonography with compression, as well as color and duplex ultrasound, were performed to evaluate the deep venous system(s) from  the level of the common femoral vein through the popliteal and proximal calf veins. COMPARISON:  None Available. FINDINGS: VENOUS There is limited evaluation of the bilateral common femoral veins and left saphenofemoral junction. The posterior tibial and peroneal veins are not well visualized in the left lower extremity. Normal compressibility of the common femoral, superficial femoral, and popliteal veins. Visualized portions of profunda femoral vein and great saphenous vein unremarkable. No filling defects to suggest DVT on grayscale or color Doppler imaging. Doppler waveforms show normal direction of venous flow, normal respiratory plasticity and response to augmentation. Limited views of the contralateral common femoral vein are  unremarkable. OTHER None. Limitations: none IMPRESSION: No evidence of deep venous thrombosis in the left lower extremity. Electronically Signed   By: Greig Pique M.D.   On: 04/18/2024 17:49      Subjective: Patient seen and examined at bedside today.  She had regular sleep last night.  She looks very comfortable this morning's urine she was sitting in the chair, playing with her phone when I arrived.  She looks in a very good mood today and wants to go home.  She looks overall euvolemic.  We discussed about giving her dose of antibiotic before discharge.  Medically stable for discharge home today.  I called her husband Hannah Khan to discuss about discharge planning,call not received  Discharge Exam: Vitals:   04/23/24 2200 04/24/24 0507  BP: (!) 131/54 124/66  Pulse: 93 92  Resp:  20  Temp:  97.8 F (36.6 C)  SpO2:  100%   Vitals:   04/23/24 1447 04/23/24 2000 04/23/24 2200 04/24/24 0507  BP: (!) 118/59 (!) 120/49 (!) 131/54 124/66  Pulse: 89 88 93 92  Resp: 14 19  20   Temp: 98.5 F (36.9 C) 98.2 F (36.8 C)  97.8 F (36.6 C)  TempSrc: Oral Oral  Oral  SpO2: 100% 99%  100%  Weight:      Height:        General: Pt is alert, awake, not in acute distress,  morbidly obese Cardiovascular: RRR, S1/S2 +, no rubs, no gallops Respiratory: CTA bilaterally, no wheezing, no rhonchi Abdominal: Soft, NT, ND, bowel sounds + Extremities: Bilateral lower extremity trace edema, no cyanosis    The results of significant diagnostics from this hospitalization (including imaging, microbiology, ancillary and laboratory) are listed below for reference.     Microbiology: Recent Results (from the past 240 hours)  Culture, blood (routine x 2)     Status: Abnormal   Collection Time: 04/18/24  4:46 PM   Specimen: BLOOD RIGHT ARM  Result Value Ref Range Status   Specimen Description   Final    BLOOD RIGHT ARM Performed at [facility], [address] [place], [place] [address]    Special Requests   Final    BOTTLES DRAWN AEROBIC AND ANAEROBIC Blood Culture adequate volume Performed at [facility], [address] [place], [place] [address]    Culture  Setup Time   Final    GRAM POSITIVE COCCI IN CLUSTERS IN BOTH AEROBIC AND ANAEROBIC BOTTLES CRITICAL RESULT CALLED TO, READ BACK BY AND VERIFIED WITH: PHARMD N. GLOGOVAC [id number] @ 1650 FH Performed at [facility], [address] [place], [place] [address]    Culture STAPHYLOCOCCUS EPIDERMIDIS (A)  Final   Report Status 04/21/2024 FINAL  Final   Organism ID, Bacteria STAPHYLOCOCCUS EPIDERMIDIS  Final      Susceptibility   Staphylococcus epidermidis - MIC*    CIPROFLOXACIN <=0.5 SENSITIVE Sensitive     ERYTHROMYCIN >=8 RESISTANT Resistant     GENTAMICIN <=0.5 SENSITIVE Sensitive     OXACILLIN >=4 RESISTANT Resistant     TETRACYCLINE 2 SENSITIVE Sensitive     VANCOMYCIN  1 SENSITIVE Sensitive     TRIMETH/SULFA 20 SENSITIVE Sensitive     CLINDAMYCIN  >=8 RESISTANT Resistant     RIFAMPIN  8 RESISTANT Resistant     Inducible Clindamycin  NEGATIVE Sensitive     * STAPHYLOCOCCUS EPIDERMIDIS  Blood Culture ID Panel (Reflexed)     Status: Abnormal   Collection Time: 04/18/24  4:46 PM  Result Value Ref Range Status   Enterococcus faecalis NOT DETECTED NOT DETECTED Final   Enterococcus Faecium NOT DETECTED NOT DETECTED Final   Listeria monocytogenes NOT DETECTED NOT DETECTED Final   Staphylococcus species DETECTED (A) NOT DETECTED Final    Comment: CRITICAL RESULT CALLED TO, READ BACK BY AND VERIFIED WITH: PHARMD N. GLOGOVAC [id number] @ 1650 FH    Staphylococcus aureus (BCID) NOT DETECTED NOT DETECTED Final   Staphylococcus epidermidis DETECTED (A) NOT DETECTED Final    Comment: Methicillin (oxacillin) resistant coagulase negative staphylococcus. Possible blood culture contaminant (unless isolated from more than one blood culture draw or clinical case suggests pathogenicity). No antibiotic treatment is indicated for blood  culture contaminants. CRITICAL RESULT CALLED TO, READ BACK BY AND VERIFIED WITH: PHARMD N. GLOGOVAC [id number] @ 1650 FH    Staphylococcus lugdunensis NOT DETECTED NOT DETECTED Final   Streptococcus species NOT DETECTED NOT DETECTED Final   Streptococcus agalactiae NOT DETECTED NOT DETECTED Final   Streptococcus pneumoniae NOT DETECTED NOT DETECTED Final   Streptococcus pyogenes NOT DETECTED NOT DETECTED Final   A.calcoaceticus-baumannii NOT DETECTED NOT DETECTED Final   Bacteroides fragilis NOT DETECTED NOT DETECTED Final   Enterobacterales NOT DETECTED NOT DETECTED Final   Enterobacter cloacae complex NOT DETECTED NOT DETECTED Final   Escherichia coli NOT DETECTED NOT DETECTED Final   Klebsiella aerogenes NOT DETECTED NOT DETECTED Final   Klebsiella oxytoca NOT DETECTED NOT DETECTED Final   Klebsiella pneumoniae NOT DETECTED NOT DETECTED Final   Proteus species NOT DETECTED NOT DETECTED Final   Salmonella species NOT DETECTED NOT DETECTED Final   Serratia marcescens NOT DETECTED NOT DETECTED Final   Haemophilus influenzae NOT DETECTED NOT DETECTED Final   Neisseria meningitidis NOT DETECTED NOT DETECTED Final   Pseudomonas aeruginosa NOT DETECTED NOT  DETECTED Final   Stenotrophomonas maltophilia NOT DETECTED NOT DETECTED Final   Candida albicans NOT DETECTED NOT DETECTED Final   Candida auris NOT DETECTED NOT DETECTED Final   Candida glabrata NOT DETECTED NOT DETECTED Final   Candida krusei NOT DETECTED NOT DETECTED Final   Candida parapsilosis NOT DETECTED NOT DETECTED Final   Candida tropicalis NOT DETECTED NOT DETECTED Final   Cryptococcus neoformans/gattii NOT DETECTED NOT DETECTED Final   Methicillin resistance mecA/C DETECTED (A) NOT DETECTED Final    Comment: CRITICAL RESULT CALLED TO, READ BACK BY AND VERIFIED WITH: PHARMD N. GLOGOVAC [id number] @ 1650 FH Performed at [facility], [address] [place], [place] [address]   Culture, blood (routine x 2)     Status: Abnormal   Collection Time: 04/18/24  4:51 PM   Specimen: BLOOD LEFT ARM  Result Value Ref Range Status   Specimen Description   Final    BLOOD LEFT ARM Performed at [facility], [address], [place], [place] [address]    Special Requests   Final    BOTTLES DRAWN AEROBIC AND ANAEROBIC Blood Culture adequate volume Performed at [facility], [address] [place], [place] [address]    Culture  Setup Time   Final    GRAM POSITIVE COCCI IN CLUSTERS IN BOTH AEROBIC AND ANAEROBIC BOTTLES CRITICAL RESULT CALLED TO, READ BACK BY AND VERIFIED WITH: PHARMD N. GLOGOVAC [id number] @ 1650 FH Performed at [facility], [address] [place], [place] [address]    Culture STAPHYLOCOCCUS EPIDERMIDIS (A)  Final   Report Status 04/21/2024 FINAL  Final   Organism ID, Bacteria STAPHYLOCOCCUS EPIDERMIDIS  Final  Susceptibility   Staphylococcus epidermidis - MIC*    CIPROFLOXACIN <=0.5 SENSITIVE Sensitive     ERYTHROMYCIN >=8 RESISTANT Resistant     GENTAMICIN <=0.5 SENSITIVE Sensitive     OXACILLIN >=4 RESISTANT Resistant     TETRACYCLINE 2 SENSITIVE Sensitive     VANCOMYCIN  2 SENSITIVE Sensitive     TRIMETH/SULFA 20 SENSITIVE Sensitive      CLINDAMYCIN  >=8 RESISTANT Resistant     RIFAMPIN  16 RESISTANT Resistant     Inducible Clindamycin  NEGATIVE Sensitive     * STAPHYLOCOCCUS EPIDERMIDIS  Resp panel by RT-PCR (RSV, Flu A&B, Covid) Anterior Nasal Swab     Status: None   Collection Time: 04/19/24  1:22 AM   Specimen: Anterior Nasal Swab  Result Value Ref Range Status   SARS Coronavirus 2 by RT PCR NEGATIVE NEGATIVE Final    Comment: (NOTE) SARS-CoV-2 target nucleic acids are NOT DETECTED.  The SARS-CoV-2 RNA is generally detectable in upper respiratory specimens during the acute phase of infection. The lowest concentration of SARS-CoV-2 viral copies this assay can detect is 138 copies/mL. A negative result does not preclude SARS-Cov-2 infection and should not be used as the sole basis for treatment or other patient management decisions. A negative result may occur with  improper specimen collection/handling, submission of specimen other than nasopharyngeal swab, presence of viral mutation(s) within the areas targeted by this assay, and inadequate number of viral copies(<138 copies/mL). A negative result must be combined with clinical observations, patient history, and epidemiological information. The expected result is Negative.  Fact Sheet for Patients:  BloggerCourse.com  Fact Sheet for Healthcare Providers:  SeriousBroker.it  This test is no t yet approved or cleared by the [place]  FDA and  has been authorized for detection and/or diagnosis of SARS-CoV-2 by FDA under an Emergency Use Authorization (EUA). This EUA will remain  in effect (meaning this test can be used) for the duration of the COVID-19 declaration under Section 564(b)(1) of the Act, 21 U.S.C.section 360bbb-3(b)(1), unless the authorization is terminated  or revoked sooner.       Influenza A by PCR NEGATIVE NEGATIVE Final   Influenza B by PCR NEGATIVE NEGATIVE Final    Comment:  (NOTE) The Xpert Xpress SARS-CoV-2/FLU/RSV plus assay is intended as an aid in the diagnosis of influenza from Nasopharyngeal swab specimens and should not be used as a sole basis for treatment. Nasal washings and aspirates are unacceptable for Xpert Xpress SARS-CoV-2/FLU/RSV testing.  Fact Sheet for Patients: BloggerCourse.com  Fact Sheet for Healthcare Providers: SeriousBroker.it  This test is not yet approved or cleared by the [place]  FDA and has been authorized for detection and/or diagnosis of SARS-CoV-2 by FDA under an Emergency Use Authorization (EUA). This EUA will remain in effect (meaning this test can be used) for the duration of the COVID-19 declaration under Section 564(b)(1) of the Act, 21 U.S.C. section 360bbb-3(b)(1), unless the authorization is terminated or revoked.     Resp Syncytial Virus by PCR NEGATIVE NEGATIVE Final    Comment: (NOTE) Fact Sheet for Patients: BloggerCourse.com  Fact Sheet for Healthcare Providers: SeriousBroker.it  This test is not yet approved or cleared by the [place]  FDA and has been authorized for detection and/or diagnosis of SARS-CoV-2 by FDA under an Emergency Use Authorization (EUA). This EUA will remain in effect (meaning this test can be used) for the duration of the COVID-19 declaration under Section 564(b)(1) of the Act, 21 U.S.C. section 360bbb-3(b)(1), unless the authorization is terminated or revoked.  Performed at [facility], [address] [place], [place] [address]   MRSA Next Gen by PCR, Nasal     Status: None   Collection Time: 04/19/24  1:22 AM   Specimen: Anterior Nasal Swab  Result Value Ref Range Status   MRSA by PCR Next Gen NOT DETECTED NOT DETECTED Final    Comment: (NOTE) The GeneXpert MRSA Assay (FDA approved for NASAL specimens only), is one component of a comprehensive  MRSA colonization surveillance program. It is not intended to diagnose MRSA infection nor to guide or monitor treatment for MRSA infections. Test performance is not FDA approved in patients less than 74 years old. Performed at [facility], [address], [place], [place] [address]   Respiratory (~20 pathogens) panel by PCR     Status: None   Collection Time: 04/19/24  9:00 AM   Specimen: Nasopharyngeal Swab; Respiratory  Result Value Ref Range Status   Adenovirus NOT DETECTED NOT DETECTED Final   Coronavirus 229E NOT DETECTED NOT DETECTED Final    Comment: (NOTE) The Coronavirus on the Respiratory Panel, DOES NOT test for the novel  Coronavirus (2019 nCoV)    Coronavirus HKU1 NOT DETECTED NOT DETECTED Final   Coronavirus NL63 NOT DETECTED NOT DETECTED Final   Coronavirus OC43 NOT DETECTED NOT DETECTED Final   Metapneumovirus NOT DETECTED NOT DETECTED Final   Rhinovirus / Enterovirus NOT DETECTED NOT DETECTED Final   Influenza A NOT DETECTED NOT DETECTED Final   Influenza B NOT DETECTED NOT DETECTED Final   Parainfluenza Virus 1 NOT DETECTED NOT DETECTED Final   Parainfluenza Virus 2 NOT DETECTED NOT DETECTED Final   Parainfluenza Virus 3 NOT DETECTED NOT DETECTED Final   Parainfluenza Virus 4 NOT DETECTED NOT DETECTED Final   Respiratory Syncytial Virus NOT DETECTED NOT DETECTED Final   Bordetella pertussis NOT DETECTED NOT DETECTED Final   Bordetella Parapertussis NOT DETECTED NOT DETECTED Final   Chlamydophila pneumoniae NOT DETECTED NOT DETECTED Final   Mycoplasma pneumoniae NOT DETECTED NOT DETECTED Final    Comment: Performed at [facility], [address], [place], [place] [address]  Culture, blood (Routine X 2) w Reflex to ID Panel     Status: None (Preliminary result)   Collection Time: 04/21/24 12:57 PM   Specimen: BLOOD  Result Value Ref Range Status   Specimen Description   Final    BLOOD BLOOD LEFT HAND Performed at [facility], [address] [place], [place] [address]    Special Requests   Final    BOTTLES DRAWN AEROBIC ONLY Blood Culture results may not be optimal due to an inadequate volume of blood received in culture bottles Performed at [facility], [address], [place], [place] [address]    Culture   Final    NO GROWTH 3 DAYS Performed at [facility], [address] [place], [place] [address]    Report Status PENDING  Incomplete  Culture, blood (Routine X 2) w Reflex to ID Panel     Status: None (Preliminary result)   Collection Time: 04/21/24 12:57 PM   Specimen: BLOOD  Result Value Ref Range Status   Specimen Description   Final    BLOOD BLOOD LEFT HAND Performed at [facility], [address], [place], [place] [address]    Special Requests   Final    BOTTLES DRAWN AEROBIC ONLY Blood Culture results may not be optimal due to an  inadequate volume of blood received in culture bottles Performed at [facility], [address], [place], [place] [address]    Culture   Final    NO GROWTH 3 DAYS Performed at [facility], [address], [place], [place] [address]    Report Status PENDING  Incomplete     Labs: BNP (last 3 results) Recent Labs    08/09/23 1024 08/31/23 1112 12/06/23 1532  BNP 194.9* 183.9* 249.9*   Basic Metabolic Panel: Recent Labs  Lab 04/19/24 0317 04/20/24 0305 04/21/24 0542 04/22/24 0502 04/23/24 0445 04/24/24 0625  NA 131* 128* 131* 133* 134* 134*  K 4.4 4.1 4.0 3.9 3.7 3.1*  CL 102 99 99 100 101 99  CO2 23 20* 22 21* 22 25  GLUCOSE 250* 229* 192* 174* 156* 106*  BUN 46* 52* 53* 50* 47* 46*  CREATININE 2.14* 2.35* 2.02* 1.67* 1.52* 1.59*  CALCIUM  8.6* 8.3* 8.8* 9.2 9.0 8.2*  MG 2.0 2.1 2.5* 2.5*  --   --   PHOS 3.5 3.5  --   --  2.6  --    Liver Function Tests: Recent Labs  Lab 04/18/24 1908 04/19/24 0317 04/20/24 0305 04/21/24 0542 04/22/24 0502 04/23/24 0445   AST 30 27 26 29  32  --   ALT 18 19 19 19 19   --   ALKPHOS 82 69 63 62 71  --   BILITOT 2.7* 3.4* 3.3* 3.3* 3.6*  --   PROT 6.1* 6.4* 6.3* 6.1* 6.5  --   ALBUMIN  3.3* 2.9* 2.7* 2.6* 2.8* 2.7*   No results for input(s): LIPASE, AMYLASE in the last 168 hours. Recent Labs  Lab 04/20/24 0305 04/21/24 0542  AMMONIA 103* 45*   CBC: Recent Labs  Lab 04/18/24 1908 04/19/24 0317 04/19/24 1517 04/19/24 1518 04/20/24 0305 04/21/24 0542 04/22/24 0502 04/23/24 0445  WBC 4.2 7.1 6.0  --  4.5 3.8* 4.8 4.9  NEUTROABS 3.7 6.7  --   --   --   --   --  3.4  HGB 5.9* 6.0* 7.8* 7.8* 7.1* 7.0* 8.4* 8.0*  HCT 18.7* 19.9* 24.6* 25.2* 23.1* 22.8* 27.3* 27.2*  MCV 87.4 92.1 89.1  --  89.5 90.5 94.1 93.8  PLT 26* 30* 36*  --  30* 32* 41* 37*   Cardiac Enzymes: Recent Labs  Lab 04/21/24 0542  CKTOTAL 32*   BNP: Invalid input(s): POCBNP CBG: Recent Labs  Lab 04/23/24 0737 04/23/24 1219 04/23/24 1656 04/23/24 2047 04/24/24 0743  GLUCAP 148* 158* 123* 214* 110*   D-Dimer No results for input(s): DDIMER in the last 72 hours. Hgb A1c No results for input(s): HGBA1C in the last 72 hours. Lipid Profile No results for input(s): CHOL, HDL, LDLCALC, TRIG, CHOLHDL, LDLDIRECT in the last 72 hours. Thyroid  function studies No results for input(s): TSH, T4TOTAL, T3FREE, THYROIDAB in the last 72 hours.  Invalid input(s): FREET3 Anemia work up No results for input(s): VITAMINB12, FOLATE, FERRITIN, TIBC, IRON, RETICCTPCT in the last 72 hours. Urinalysis    Component Value Date/Time   COLORURINE YELLOW 04/19/2024 0211   APPEARANCEUR HAZY (A) 04/19/2024 0211   LABSPEC 1.013 04/19/2024 0211   PHURINE 5.0 04/19/2024 0211   GLUCOSEU NEGATIVE 04/19/2024 0211   HGBUR NEGATIVE 04/19/2024 0211   BILIRUBINUR NEGATIVE 04/19/2024 0211   KETONESUR NEGATIVE 04/19/2024 0211   PROTEINUR NEGATIVE 04/19/2024 0211   UROBILINOGEN 2.0 (H) 01/31/2015 0055    NITRITE NEGATIVE 04/19/2024 0211   LEUKOCYTESUR NEGATIVE 04/19/2024 0211  Sepsis Labs Recent Labs  Lab 04/20/24 0305 04/21/24 0542 04/22/24 0502 04/23/24 0445  WBC 4.5 3.8* 4.8 4.9   Microbiology Recent Results (from the past 240 hours)  Culture, blood (routine x 2)     Status: Abnormal   Collection Time: 04/18/24  4:46 PM   Specimen: BLOOD RIGHT ARM  Result Value Ref Range Status   Specimen Description   Final    BLOOD RIGHT ARM Performed at [facility], [address] [place], [place] [address]    Special Requests   Final    BOTTLES DRAWN AEROBIC AND ANAEROBIC Blood Culture adequate volume Performed at [facility], [address] [place], [place] [address]    Culture  Setup Time   Final    GRAM POSITIVE COCCI IN CLUSTERS IN BOTH AEROBIC AND ANAEROBIC BOTTLES CRITICAL RESULT CALLED TO, READ BACK BY AND VERIFIED WITH: PHARMD N. GLOGOVAC [id number] @ 1650 FH Performed at [facility], [address], [place], [place] [address]    Culture STAPHYLOCOCCUS EPIDERMIDIS (A)  Final   Report Status 04/21/2024 FINAL  Final   Organism ID, Bacteria STAPHYLOCOCCUS EPIDERMIDIS  Final      Susceptibility   Staphylococcus epidermidis - MIC*    CIPROFLOXACIN <=0.5 SENSITIVE Sensitive     ERYTHROMYCIN >=8 RESISTANT Resistant     GENTAMICIN <=0.5 SENSITIVE Sensitive     OXACILLIN >=4 RESISTANT Resistant     TETRACYCLINE 2 SENSITIVE Sensitive     VANCOMYCIN  1 SENSITIVE Sensitive     TRIMETH/SULFA 20 SENSITIVE Sensitive     CLINDAMYCIN  >=8 RESISTANT Resistant     RIFAMPIN  8 RESISTANT Resistant     Inducible Clindamycin  NEGATIVE Sensitive     * STAPHYLOCOCCUS EPIDERMIDIS  Blood Culture ID Panel (Reflexed)     Status: Abnormal   Collection Time: 04/18/24  4:46 PM  Result Value Ref Range Status   Enterococcus faecalis NOT DETECTED NOT DETECTED Final   Enterococcus Faecium NOT DETECTED NOT DETECTED Final   Listeria monocytogenes NOT DETECTED NOT DETECTED Final    Staphylococcus species DETECTED (A) NOT DETECTED Final    Comment: CRITICAL RESULT CALLED TO, READ BACK BY AND VERIFIED WITH: PHARMD N. GLOGOVAC [id number] @ 1650 FH    Staphylococcus aureus (BCID) NOT DETECTED NOT DETECTED Final   Staphylococcus epidermidis DETECTED (A) NOT DETECTED Final    Comment: Methicillin (oxacillin) resistant coagulase negative staphylococcus. Possible blood culture contaminant (unless isolated from more than one blood culture draw or clinical case suggests pathogenicity). No antibiotic treatment is indicated for blood  culture contaminants. CRITICAL RESULT CALLED TO, READ BACK BY AND VERIFIED WITH: PHARMD N. GLOGOVAC [id number] @ 1650 FH    Staphylococcus lugdunensis NOT DETECTED NOT DETECTED Final   Streptococcus species NOT DETECTED NOT DETECTED Final   Streptococcus agalactiae NOT DETECTED NOT DETECTED Final   Streptococcus pneumoniae NOT DETECTED NOT DETECTED Final   Streptococcus pyogenes NOT DETECTED NOT DETECTED Final   A.calcoaceticus-baumannii NOT DETECTED NOT DETECTED Final   Bacteroides fragilis NOT DETECTED NOT DETECTED Final   Enterobacterales NOT DETECTED NOT DETECTED Final   Enterobacter cloacae complex NOT DETECTED NOT DETECTED Final   Escherichia coli NOT DETECTED NOT DETECTED Final   Klebsiella aerogenes NOT DETECTED NOT DETECTED Final   Klebsiella oxytoca NOT DETECTED NOT DETECTED Final   Klebsiella pneumoniae NOT DETECTED NOT DETECTED Final   Proteus species NOT DETECTED NOT DETECTED Final   Salmonella species NOT DETECTED NOT DETECTED Final   Serratia marcescens NOT DETECTED  NOT DETECTED Final   Haemophilus influenzae NOT DETECTED NOT DETECTED Final   Neisseria meningitidis NOT DETECTED NOT DETECTED Final   Pseudomonas aeruginosa NOT DETECTED NOT DETECTED Final   Stenotrophomonas maltophilia NOT DETECTED NOT DETECTED Final   Candida albicans NOT DETECTED NOT DETECTED Final   Candida auris NOT DETECTED NOT DETECTED Final   Candida glabrata NOT  DETECTED NOT DETECTED Final   Candida krusei NOT DETECTED NOT DETECTED Final   Candida parapsilosis NOT DETECTED NOT DETECTED Final   Candida tropicalis NOT DETECTED NOT DETECTED Final   Cryptococcus neoformans/gattii NOT DETECTED NOT DETECTED Final   Methicillin resistance mecA/C DETECTED (A) NOT DETECTED Final    Comment: CRITICAL RESULT CALLED TO, READ BACK BY AND VERIFIED WITH: PHARMD N. GLOGOVAC [id number] @ 1650 FH Performed at [facility], [address], [place], [place] [address]   Culture, blood (routine x 2)     Status: Abnormal   Collection Time: 04/18/24  4:51 PM   Specimen: BLOOD LEFT ARM  Result Value Ref Range Status   Specimen Description   Final    BLOOD LEFT ARM Performed at [facility], [address] [place], [place] [address]    Special Requests   Final    BOTTLES DRAWN AEROBIC AND ANAEROBIC Blood Culture adequate volume Performed at [facility], [address] [place], [place] [address]    Culture  Setup Time   Final    GRAM POSITIVE COCCI IN CLUSTERS IN BOTH AEROBIC AND ANAEROBIC BOTTLES CRITICAL RESULT CALLED TO, READ BACK BY AND VERIFIED WITH: PHARMD N. GLOGOVAC [id number] @ 1650 FH Performed at [facility], [address], [place], [place] [address]    Culture STAPHYLOCOCCUS EPIDERMIDIS (A)  Final   Report Status 04/21/2024 FINAL  Final   Organism ID, Bacteria STAPHYLOCOCCUS EPIDERMIDIS  Final      Susceptibility   Staphylococcus epidermidis - MIC*    CIPROFLOXACIN <=0.5 SENSITIVE Sensitive     ERYTHROMYCIN >=8 RESISTANT Resistant     GENTAMICIN <=0.5 SENSITIVE Sensitive     OXACILLIN >=4 RESISTANT Resistant     TETRACYCLINE 2 SENSITIVE Sensitive     VANCOMYCIN  2 SENSITIVE Sensitive     TRIMETH/SULFA 20 SENSITIVE Sensitive     CLINDAMYCIN  >=8 RESISTANT Resistant     RIFAMPIN  16 RESISTANT Resistant     Inducible Clindamycin  NEGATIVE Sensitive     * STAPHYLOCOCCUS EPIDERMIDIS  Resp panel by RT-PCR (RSV, Flu A&B, Covid)  Anterior Nasal Swab     Status: None   Collection Time: 04/19/24  1:22 AM   Specimen: Anterior Nasal Swab  Result Value Ref Range Status   SARS Coronavirus 2 by RT PCR NEGATIVE NEGATIVE Final    Comment: (NOTE) SARS-CoV-2 target nucleic acids are NOT DETECTED.  The SARS-CoV-2 RNA is generally detectable in upper respiratory specimens during the acute phase of infection. The lowest concentration of SARS-CoV-2 viral copies this assay can detect is 138 copies/mL. A negative result does not preclude SARS-Cov-2 infection and should not be used as the sole basis for treatment or other patient management decisions. A negative result may occur with  improper specimen collection/handling, submission of specimen other than nasopharyngeal swab, presence of viral mutation(s) within the areas targeted by this assay, and inadequate number of viral copies(<138 copies/mL). A negative result must be combined with clinical observations, patient history, and epidemiological information. The expected result is Negative.  Fact Sheet for Patients:  BloggerCourse.com  Fact Sheet for  Healthcare Providers:  SeriousBroker.it  This test is no t yet approved or cleared by the [place]  FDA and  has been authorized for detection and/or diagnosis of SARS-CoV-2 by FDA under an Emergency Use Authorization (EUA). This EUA will remain  in effect (meaning this test can be used) for the duration of the COVID-19 declaration under Section 564(b)(1) of the Act, 21 U.S.C.section 360bbb-3(b)(1), unless the authorization is terminated  or revoked sooner.       Influenza A by PCR NEGATIVE NEGATIVE Final   Influenza B by PCR NEGATIVE NEGATIVE Final    Comment: (NOTE) The Xpert Xpress SARS-CoV-2/FLU/RSV plus assay is intended as an aid in the diagnosis of influenza from Nasopharyngeal swab specimens and should not be used as a sole basis for treatment. Nasal washings  and aspirates are unacceptable for Xpert Xpress SARS-CoV-2/FLU/RSV testing.  Fact Sheet for Patients: BloggerCourse.com  Fact Sheet for Healthcare Providers: SeriousBroker.it  This test is not yet approved or cleared by the [place]  FDA and has been authorized for detection and/or diagnosis of SARS-CoV-2 by FDA under an Emergency Use Authorization (EUA). This EUA will remain in effect (meaning this test can be used) for the duration of the COVID-19 declaration under Section 564(b)(1) of the Act, 21 U.S.C. section 360bbb-3(b)(1), unless the authorization is terminated or revoked.     Resp Syncytial Virus by PCR NEGATIVE NEGATIVE Final    Comment: (NOTE) Fact Sheet for Patients: BloggerCourse.com  Fact Sheet for Healthcare Providers: SeriousBroker.it  This test is not yet approved or cleared by the [place]  FDA and has been authorized for detection and/or diagnosis of SARS-CoV-2 by FDA under an Emergency Use Authorization (EUA). This EUA will remain in effect (meaning this test can be used) for the duration of the COVID-19 declaration under Section 564(b)(1) of the Act, 21 U.S.C. section 360bbb-3(b)(1), unless the authorization is terminated or revoked.  Performed at [facility], [address], [place], [place] [address]   MRSA Next Gen by PCR, Nasal     Status: None   Collection Time: 04/19/24  1:22 AM   Specimen: Anterior Nasal Swab  Result Value Ref Range Status   MRSA by PCR Next Gen NOT DETECTED NOT DETECTED Final    Comment: (NOTE) The GeneXpert MRSA Assay (FDA approved for NASAL specimens only), is one component of a comprehensive MRSA colonization surveillance program. It is not intended to diagnose MRSA infection nor to guide or monitor treatment for MRSA infections. Test performance is not FDA approved in patients less than 24  years old. Performed at [facility], [address] [place], [place] [address]   Respiratory (~20 pathogens) panel by PCR     Status: None   Collection Time: 04/19/24  9:00 AM   Specimen: Nasopharyngeal Swab; Respiratory  Result Value Ref Range Status   Adenovirus NOT DETECTED NOT DETECTED Final   Coronavirus 229E NOT DETECTED NOT DETECTED Final    Comment: (NOTE) The Coronavirus on the Respiratory Panel, DOES NOT test for the novel  Coronavirus (2019 nCoV)    Coronavirus HKU1 NOT DETECTED NOT DETECTED Final   Coronavirus NL63 NOT DETECTED NOT DETECTED Final   Coronavirus OC43 NOT DETECTED NOT DETECTED Final   Metapneumovirus NOT DETECTED NOT DETECTED Final   Rhinovirus / Enterovirus NOT DETECTED NOT DETECTED Final   Influenza A NOT DETECTED NOT DETECTED Final   Influenza B NOT DETECTED NOT DETECTED Final   Parainfluenza Virus 1 NOT DETECTED NOT DETECTED Final  Parainfluenza Virus 2 NOT DETECTED NOT DETECTED Final   Parainfluenza Virus 3 NOT DETECTED NOT DETECTED Final   Parainfluenza Virus 4 NOT DETECTED NOT DETECTED Final   Respiratory Syncytial Virus NOT DETECTED NOT DETECTED Final   Bordetella pertussis NOT DETECTED NOT DETECTED Final   Bordetella Parapertussis NOT DETECTED NOT DETECTED Final   Chlamydophila pneumoniae NOT DETECTED NOT DETECTED Final   Mycoplasma pneumoniae NOT DETECTED NOT DETECTED Final    Comment: Performed at [facility], [address], [place], [place] [address]  Culture, blood (Routine X 2) w Reflex to ID Panel     Status: None (Preliminary result)   Collection Time: 04/21/24 12:57 PM   Specimen: BLOOD  Result Value Ref Range Status   Specimen Description   Final    BLOOD BLOOD LEFT HAND Performed at [facility], [address] [place], [place] [address]    Special Requests   Final    BOTTLES DRAWN AEROBIC ONLY Blood Culture results may not be optimal due to an inadequate volume of blood received in  culture bottles Performed at [facility], [address], [place], [place] [address]    Culture   Final    NO GROWTH 3 DAYS Performed at [facility], [address], [place], [place] [address]    Report Status PENDING  Incomplete  Culture, blood (Routine X 2) w Reflex to ID Panel     Status: None (Preliminary result)   Collection Time: 04/21/24 12:57 PM   Specimen: BLOOD  Result Value Ref Range Status   Specimen Description   Final    BLOOD BLOOD LEFT HAND Performed at [facility], [address], [place], [place] [address]    Special Requests   Final    BOTTLES DRAWN AEROBIC ONLY Blood Culture results may not be optimal due to an inadequate volume of blood received in culture bottles Performed at [facility], [address] [place], [place] [address]    Culture   Final    NO GROWTH 3 DAYS Performed at [facility], [address] [place], [place] [address]    Report Status PENDING  Incomplete    Please note: You were cared for by a hospitalist during your hospital stay. Once you are discharged, your primary care physician will handle any further medical issues. Please note that NO REFILLS for any discharge medications will be authorized once you are discharged, as it is imperative that you return to your primary care physician (or establish a relationship with a primary care physician if you do not have one) for your post hospital discharge needs so that they can reassess your need for medications and monitor your lab values.    Time coordinating discharge: 40 minutes  SIGNED:   Ivonne Mustache, MD  [facility] 04/24/2024, 10:46 AM Pager [phone]  If 7PM-7AM, please contact night-coverage [email or web address] Password [email or web address]

## 2024-04-24 NOTE — Progress Notes (Signed)
 NAME: Hannah Khan  DOB: 1956/07/22  MRN: [id number]  Date/Time: 04/24/2024 1:40 PM   Subjective:   ? Hannah Khan is a 68 y.o.female  with a history of NASH, cirrhosis, PHT, gastric varices, main portal vein chronic thrombosis, afib, DM, HTN, CHF, CKD, h/o panniculitis and cellulitis. Morbid obesity      REVIEW OF SYSTEMS:  Doing much better No weakness Breathing much better  Objective:  VITALS:  BP 124/66 (BP Location: Left Arm)   Pulse 92   Temp 97.8 F (36.6 C) (Oral)   Resp 20   Ht 5' 7 (1.702 m)   Wt 134 kg   SpO2 100%   BMI 46.27 kg/m   PHYSICAL EXAM:  General: Alert, cooperative,  Lungs: b/l air entry. Heart: 4/6 systolic murmur. Abdomen: Soft,pannus inmnduration on the rt side- no erythema or tenderness. Bowel sounds normal. No masses Extremities: b/l lymphedema Left thigh erythema and tenderness has resolved skin: multiple excoriations scattered over lower extremities rt shoulder arms Lymph: Cervical, supraclavicular normal. Neurologic: Grossly non-focal Pertinent Labs Lab Results CBC    Component Value Date/Time   WBC 3.8 (L) 04/24/2024 0625   RBC 2.60 (L) 04/24/2024 0625   HGB 7.4 (L) 04/24/2024 0625   HGB 14.5 12/12/2022 0942   HGB 12.6 08/25/2017 1418   HCT 24.3 (L) 04/24/2024 0625   HCT 42.2 12/12/2022 0942   HCT 37.3 08/25/2017 1418   PLT 31 (L) 04/24/2024 0625   PLT Comment 12/12/2022 0942   MCV 93.5 04/24/2024 0625   MCV 93 12/12/2022 0942   MCV 91.2 08/25/2017 1418   MCH 28.5 04/24/2024 0625   MCHC 30.5 04/24/2024 0625   RDW 19.6 (H) 04/24/2024 0625   RDW 14.7 12/12/2022 0942   RDW 14.8 (H) 08/25/2017 1418   LYMPHSABS 0.5 (L) 04/23/2024 0445   LYMPHSABS 0.8 11/16/2021 1111   LYMPHSABS 0.7 (L) 08/25/2017 1418   MONOABS 0.8 04/23/2024 0445   MONOABS 0.3 08/25/2017 1418   EOSABS 0.1 04/23/2024 0445   EOSABS 0.1 11/16/2021 1111   BASOSABS 0.0 04/23/2024 0445   BASOSABS 0.0 11/16/2021 1111   BASOSABS 0.0 08/25/2017 1418        Latest Ref Rng & Units 04/24/2024    6:25 AM 04/23/2024    4:45 AM 04/22/2024    5:02 AM  CMP  Glucose 70 - 99 mg/dL 893  843  825   BUN 8 - 23 mg/dL 46  47  50   Creatinine 0.44 - 1.00 mg/dL 8.40  8.47  8.32   Sodium 135 - 145 mmol/L 134  134  133   Potassium 3.5 - 5.1 mmol/L 3.1  3.7  3.9   Chloride 98 - 111 mmol/L 99  101  100   CO2 22 - 32 mmol/L 25  22  21    Calcium  8.9 - 10.3 mg/dL 8.2  9.0  9.2   Total Protein 6.5 - 8.1 g/dL   6.5   Total Bilirubin 0.0 - 1.2 mg/dL   3.6   Alkaline Phos 38 - 126 U/L   71   AST 15 - 41 U/L   32   ALT 0 - 44 U/L   19       Microbiology: Recent Results (from the past 240 hours)  Culture, blood (routine x 2)     Status: Abnormal   Collection Time: 04/18/24  4:46 PM   Specimen: BLOOD RIGHT ARM  Result Value Ref Range Status   Specimen Description   Final  BLOOD RIGHT ARM Performed at [facility], [address], [place], [place] [address]    Special Requests   Final    BOTTLES DRAWN AEROBIC AND ANAEROBIC Blood Culture adequate volume Performed at [facility], [address] [place], [place] [address]    Culture  Setup Time   Final    GRAM POSITIVE COCCI IN CLUSTERS IN BOTH AEROBIC AND ANAEROBIC BOTTLES CRITICAL RESULT CALLED TO, READ BACK BY AND VERIFIED WITH: PHARMD N. GLOGOVAC [id number] @ 1650 FH Performed at [facility], [address], [place], [place] [address]    Culture STAPHYLOCOCCUS EPIDERMIDIS (A)  Final   Report Status 04/21/2024 FINAL  Final   Organism ID, Bacteria STAPHYLOCOCCUS EPIDERMIDIS  Final      Susceptibility   Staphylococcus epidermidis - MIC*    CIPROFLOXACIN <=0.5 SENSITIVE Sensitive     ERYTHROMYCIN >=8 RESISTANT Resistant     GENTAMICIN <=0.5 SENSITIVE Sensitive     OXACILLIN >=4 RESISTANT Resistant     TETRACYCLINE 2 SENSITIVE Sensitive     VANCOMYCIN  1 SENSITIVE Sensitive     TRIMETH/SULFA 20 SENSITIVE Sensitive     CLINDAMYCIN  >=8 RESISTANT Resistant     RIFAMPIN  8  RESISTANT Resistant     Inducible Clindamycin  NEGATIVE Sensitive     * STAPHYLOCOCCUS EPIDERMIDIS  Blood Culture ID Panel (Reflexed)     Status: Abnormal   Collection Time: 04/18/24  4:46 PM  Result Value Ref Range Status   Enterococcus faecalis NOT DETECTED NOT DETECTED Final   Enterococcus Faecium NOT DETECTED NOT DETECTED Final   Listeria monocytogenes NOT DETECTED NOT DETECTED Final   Staphylococcus species DETECTED (A) NOT DETECTED Final    Comment: CRITICAL RESULT CALLED TO, READ BACK BY AND VERIFIED WITH: PHARMD N. GLOGOVAC [id number] @ 1650 FH    Staphylococcus aureus (BCID) NOT DETECTED NOT DETECTED Final   Staphylococcus epidermidis DETECTED (A) NOT DETECTED Final    Comment: Methicillin (oxacillin) resistant coagulase negative staphylococcus. Possible blood culture contaminant (unless isolated from more than one blood culture draw or clinical case suggests pathogenicity). No antibiotic treatment is indicated for blood  culture contaminants. CRITICAL RESULT CALLED TO, READ BACK BY AND VERIFIED WITH: PHARMD N. GLOGOVAC [id number] @ 1650 FH    Staphylococcus lugdunensis NOT DETECTED NOT DETECTED Final   Streptococcus species NOT DETECTED NOT DETECTED Final   Streptococcus agalactiae NOT DETECTED NOT DETECTED Final   Streptococcus pneumoniae NOT DETECTED NOT DETECTED Final   Streptococcus pyogenes NOT DETECTED NOT DETECTED Final   A.calcoaceticus-baumannii NOT DETECTED NOT DETECTED Final   Bacteroides fragilis NOT DETECTED NOT DETECTED Final   Enterobacterales NOT DETECTED NOT DETECTED Final   Enterobacter cloacae complex NOT DETECTED NOT DETECTED Final   Escherichia coli NOT DETECTED NOT DETECTED Final   Klebsiella aerogenes NOT DETECTED NOT DETECTED Final   Klebsiella oxytoca NOT DETECTED NOT DETECTED Final   Klebsiella pneumoniae NOT DETECTED NOT DETECTED Final   Proteus species NOT DETECTED NOT DETECTED Final   Salmonella species NOT DETECTED NOT DETECTED Final   Serratia  marcescens NOT DETECTED NOT DETECTED Final   Haemophilus influenzae NOT DETECTED NOT DETECTED Final   Neisseria meningitidis NOT DETECTED NOT DETECTED Final   Pseudomonas aeruginosa NOT DETECTED NOT DETECTED Final   Stenotrophomonas maltophilia NOT DETECTED NOT DETECTED Final   Candida albicans NOT DETECTED NOT DETECTED Final   Candida auris NOT DETECTED NOT DETECTED Final   Candida glabrata NOT DETECTED NOT DETECTED Final   Candida krusei NOT  DETECTED NOT DETECTED Final   Candida parapsilosis NOT DETECTED NOT DETECTED Final   Candida tropicalis NOT DETECTED NOT DETECTED Final   Cryptococcus neoformans/gattii NOT DETECTED NOT DETECTED Final   Methicillin resistance mecA/C DETECTED (A) NOT DETECTED Final    Comment: CRITICAL RESULT CALLED TO, READ BACK BY AND VERIFIED WITH: PHARMD N. GLOGOVAC [id number] @ 1650 FH Performed at [facility], [address], [place], [place] [address]   Culture, blood (routine x 2)     Status: Abnormal   Collection Time: 04/18/24  4:51 PM   Specimen: BLOOD LEFT ARM  Result Value Ref Range Status   Specimen Description   Final    BLOOD LEFT ARM Performed at [facility], [address] [place], [place] [address]    Special Requests   Final    BOTTLES DRAWN AEROBIC AND ANAEROBIC Blood Culture adequate volume Performed at [facility], [address] [place], [place] [address]    Culture  Setup Time   Final    GRAM POSITIVE COCCI IN CLUSTERS IN BOTH AEROBIC AND ANAEROBIC BOTTLES CRITICAL RESULT CALLED TO, READ BACK BY AND VERIFIED WITH: PHARMD N. GLOGOVAC [id number] @ 1650 FH Performed at [facility], [address], [place], [place] [address]    Culture STAPHYLOCOCCUS EPIDERMIDIS (A)  Final   Report Status 04/21/2024 FINAL  Final   Organism ID, Bacteria STAPHYLOCOCCUS EPIDERMIDIS  Final      Susceptibility   Staphylococcus epidermidis - MIC*    CIPROFLOXACIN <=0.5 SENSITIVE Sensitive     ERYTHROMYCIN >=8 RESISTANT Resistant      GENTAMICIN <=0.5 SENSITIVE Sensitive     OXACILLIN >=4 RESISTANT Resistant     TETRACYCLINE 2 SENSITIVE Sensitive     VANCOMYCIN  2 SENSITIVE Sensitive     TRIMETH/SULFA 20 SENSITIVE Sensitive     CLINDAMYCIN  >=8 RESISTANT Resistant     RIFAMPIN  16 RESISTANT Resistant     Inducible Clindamycin  NEGATIVE Sensitive     * STAPHYLOCOCCUS EPIDERMIDIS  Resp panel by RT-PCR (RSV, Flu A&B, Covid) Anterior Nasal Swab     Status: None   Collection Time: 04/19/24  1:22 AM   Specimen: Anterior Nasal Swab  Result Value Ref Range Status   SARS Coronavirus 2 by RT PCR NEGATIVE NEGATIVE Final    Comment: (NOTE) SARS-CoV-2 target nucleic acids are NOT DETECTED.  The SARS-CoV-2 RNA is generally detectable in upper respiratory specimens during the acute phase of infection. The lowest concentration of SARS-CoV-2 viral copies this assay can detect is 138 copies/mL. A negative result does not preclude SARS-Cov-2 infection and should not be used as the sole basis for treatment or other patient management decisions. A negative result may occur with  improper specimen collection/handling, submission of specimen other than nasopharyngeal swab, presence of viral mutation(s) within the areas targeted by this assay, and inadequate number of viral copies(<138 copies/mL). A negative result must be combined with clinical observations, patient history, and epidemiological information. The expected result is Negative.  Fact Sheet for Patients:  BloggerCourse.com  Fact Sheet for Healthcare Providers:  SeriousBroker.it  This test is no t yet approved or cleared by the [place]  FDA and  has been authorized for detection and/or diagnosis of SARS-CoV-2 by FDA under an Emergency Use Authorization (EUA). This EUA will remain  in effect (meaning this test can be used) for the duration of the COVID-19 declaration under Section 564(b)(1) of the Act,  21 U.S.C.section 360bbb-3(b)(1), unless the authorization is  terminated  or revoked sooner.       Influenza A by PCR NEGATIVE NEGATIVE Final   Influenza B by PCR NEGATIVE NEGATIVE Final    Comment: (NOTE) The Xpert Xpress SARS-CoV-2/FLU/RSV plus assay is intended as an aid in the diagnosis of influenza from Nasopharyngeal swab specimens and should not be used as a sole basis for treatment. Nasal washings and aspirates are unacceptable for Xpert Xpress SARS-CoV-2/FLU/RSV testing.  Fact Sheet for Patients: BloggerCourse.com  Fact Sheet for Healthcare Providers: SeriousBroker.it  This test is not yet approved or cleared by the [place]  FDA and has been authorized for detection and/or diagnosis of SARS-CoV-2 by FDA under an Emergency Use Authorization (EUA). This EUA will remain in effect (meaning this test can be used) for the duration of the COVID-19 declaration under Section 564(b)(1) of the Act, 21 U.S.C. section 360bbb-3(b)(1), unless the authorization is terminated or revoked.     Resp Syncytial Virus by PCR NEGATIVE NEGATIVE Final    Comment: (NOTE) Fact Sheet for Patients: BloggerCourse.com  Fact Sheet for Healthcare Providers: SeriousBroker.it  This test is not yet approved or cleared by the [place]  FDA and has been authorized for detection and/or diagnosis of SARS-CoV-2 by FDA under an Emergency Use Authorization (EUA). This EUA will remain in effect (meaning this test can be used) for the duration of the COVID-19 declaration under Section 564(b)(1) of the Act, 21 U.S.C. section 360bbb-3(b)(1), unless the authorization is terminated or revoked.  Performed at [facility], [address] [place], [place] [address]   MRSA Next Gen by PCR, Nasal     Status: None   Collection Time: 04/19/24  1:22 AM   Specimen: Anterior Nasal Swab   Result Value Ref Range Status   MRSA by PCR Next Gen NOT DETECTED NOT DETECTED Final    Comment: (NOTE) The GeneXpert MRSA Assay (FDA approved for NASAL specimens only), is one component of a comprehensive MRSA colonization surveillance program. It is not intended to diagnose MRSA infection nor to guide or monitor treatment for MRSA infections. Test performance is not FDA approved in patients less than 89 years old. Performed at [facility], [address] [place], [place] [address]   Respiratory (~20 pathogens) panel by PCR     Status: None   Collection Time: 04/19/24  9:00 AM   Specimen: Nasopharyngeal Swab; Respiratory  Result Value Ref Range Status   Adenovirus NOT DETECTED NOT DETECTED Final   Coronavirus 229E NOT DETECTED NOT DETECTED Final    Comment: (NOTE) The Coronavirus on the Respiratory Panel, DOES NOT test for the novel  Coronavirus (2019 nCoV)    Coronavirus HKU1 NOT DETECTED NOT DETECTED Final   Coronavirus NL63 NOT DETECTED NOT DETECTED Final   Coronavirus OC43 NOT DETECTED NOT DETECTED Final   Metapneumovirus NOT DETECTED NOT DETECTED Final   Rhinovirus / Enterovirus NOT DETECTED NOT DETECTED Final   Influenza A NOT DETECTED NOT DETECTED Final   Influenza B NOT DETECTED NOT DETECTED Final   Parainfluenza Virus 1 NOT DETECTED NOT DETECTED Final   Parainfluenza Virus 2 NOT DETECTED NOT DETECTED Final   Parainfluenza Virus 3 NOT DETECTED NOT DETECTED Final   Parainfluenza Virus 4 NOT DETECTED NOT DETECTED Final   Respiratory Syncytial Virus NOT DETECTED NOT DETECTED Final   Bordetella pertussis NOT DETECTED NOT DETECTED Final   Bordetella Parapertussis NOT DETECTED NOT DETECTED Final   Chlamydophila pneumoniae NOT DETECTED NOT DETECTED Final   Mycoplasma pneumoniae NOT DETECTED  NOT DETECTED Final    Comment: Performed at [facility], [address] [place], [place] [address]  Culture, blood (Routine X 2) w Reflex to ID Panel      Status: None (Preliminary result)   Collection Time: 04/21/24 12:57 PM   Specimen: BLOOD  Result Value Ref Range Status   Specimen Description   Final    BLOOD BLOOD LEFT HAND Performed at [facility], [address], [place], [place] [address]    Special Requests   Final    BOTTLES DRAWN AEROBIC ONLY Blood Culture results may not be optimal due to an inadequate volume of blood received in culture bottles Performed at [facility], [address] [place], [place] [address]    Culture   Final    NO GROWTH 3 DAYS Performed at [facility], [address], [place], [place] [address]    Report Status PENDING  Incomplete  Culture, blood (Routine X 2) w Reflex to ID Panel     Status: None (Preliminary result)   Collection Time: 04/21/24 12:57 PM   Specimen: BLOOD  Result Value Ref Range Status   Specimen Description   Final    BLOOD BLOOD LEFT HAND Performed at [facility], [address] [place] [address], [place], [place] [address]    Special Requests   Final    BOTTLES DRAWN AEROBIC ONLY Blood Culture results may not be optimal due to an inadequate volume of blood received in culture bottles Performed at [facility], [address] [place], [place] [address]    Culture   Final    NO GROWTH 3 DAYS Performed at [facility], [address], [place], [place] [address]    Report Status PENDING  Incomplete    Lines and Device Date on insertion # of days DC  Central line     Foley     ETT       IMAGING RESULTS: CXR left basilar opacity I have personally reviewed the films ? Impression/Recommendation ?Staphylococcal epidermidis bacteremia-- from the left leg cellulitis which is much better Has  muliple excoriations on her skin can be the source of bacteremia  She has a chronic systolic murmur  but the risk for endocarditis is less as she has no prosthetic valve or cardiac device 2 d echo p[oor study-  no need for TEE now  Will treat for 2 weeks with antibiotic and can repeat blood culture off antibiotics at a later date Will do dalbavancin 1500mg   long acting one dose  before discharge today she will not need any IV antibiotic as OPAT  Possible LLL pneumonia- on doxy  Afib  NASH with cirrhosis , varices Pancytopenia due to that  Severe thrombocytopenia  Morbid obesity OSA ? ?Discussed the management with the patient and hospitalist Will follow as OP

## 2024-04-24 NOTE — TOC Transition Note (Signed)
 Transition of Care [facility]) - Discharge Note   Patient Details  Name: Hannah Khan MRN: [id number] Date of Birth: [birth year]  Transition of [facility]) CM/SW Contact:  Bascom Service, RN Phone Number: 04/24/2024, 10:38 AM   Clinical Narrative:  [facility] HHRN/PT already set up rep Lynette aware. No further CM needs.     Final next level of care: Home w Home Health Services Barriers to Discharge: No Barriers Identified   Patient Goals and CMS Choice Patient states their goals for this hospitalization and ongoing recovery are:: Home CMS Medicare.gov Compare Post Acute Care list provided to:: Patient Choice offered to / list presented to : Patient [place] ownership interest in [facility].provided to:: Spouse    Discharge Placement                       Discharge Plan and Services Additional resources added to the After Visit Summary for     Discharge Planning Services: CM Consult Post Acute Care Choice: Home Health          DME Arranged: N/A DME Agency: NA       HH Arranged: RN, PT HH Agency: [facility] Date HH Agency Contacted: 04/24/24 Time HH Agency Contacted: 1038 Representative spoke with at [facility] Agency: Arna  Social Drivers of Health (SDOH) Interventions SDOH Screenings   Food Insecurity: No Food Insecurity (04/21/2024)  Housing: Low Risk  (04/21/2024)  Transportation Needs: No Transportation Needs (04/21/2024)  Utilities: Not At Risk (04/21/2024)  Social Connections: Socially Integrated (04/19/2024)  Tobacco Use: Medium Risk (04/18/2024)     Readmission Risk Interventions    04/21/2024    2:22 PM 04/08/2023    9:42 AM 03/12/2023    1:47 PM  Readmission Risk Prevention Plan  Transportation Screening Complete Complete Complete  PCP or Specialist Appt within 3-5 Days  Complete   Home Care Screening   Complete  Medication Review (RN CM)   Complete  HRI or Home Care Consult  Complete   Social Work Consult for Recovery Care Planning/Counseling   Complete   Palliative Care Screening  Not Applicable   Medication Review Oceanographer) Complete Complete   PCP or Specialist appointment within 3-5 days of discharge Complete    HRI or Home Care Consult Complete    SW Recovery Care/Counseling Consult Complete    Palliative Care Screening Not Applicable    Skilled Nursing Facility Not Applicable

## 2024-04-24 NOTE — Progress Notes (Signed)
 Discharge medication delivered to patient at bedside [facility]

## 2024-04-25 ENCOUNTER — Ambulatory Visit

## 2024-04-25 NOTE — Therapy (Signed)
 [facility] [address] [place], [place], [address] Phone: [phone]   Fax:  [phone]  Patient Details  Name: Hannah Khan MRN: [id number] Date of Birth: 29-Jul-1956 Referring Provider:  No ref. provider found  Encounter Date: 04/25/2024  PHYSICAL THERAPY DISCHARGE SUMMARY  Visits from Start of Care: 5  Current functional level related to goals / functional outcomes: N/A--pt hospitalized and subsequently D/C to Home Health Services   Remaining deficits: N/A   Education / Equipment: HEP initiated   Patient agrees to discharge. Patient goals were not met. Patient is being discharged due to a change in medical status.   Jonette MARLA Sandifer, PT 04/25/2024, 10:22 AM  [facility] [address] [place], [place], [address] Phone: [phone]   Fax:  [phone]

## 2024-04-26 LAB — CULTURE, BLOOD (ROUTINE X 2)
Culture: NO GROWTH
Culture: NO GROWTH

## 2024-04-27 DIAGNOSIS — J189 Pneumonia, unspecified organism: Secondary | ICD-10-CM | POA: Diagnosis not present

## 2024-04-27 DIAGNOSIS — A411 Sepsis due to other specified staphylococcus: Secondary | ICD-10-CM | POA: Diagnosis not present

## 2024-04-27 DIAGNOSIS — N179 Acute kidney failure, unspecified: Secondary | ICD-10-CM | POA: Diagnosis not present

## 2024-04-27 DIAGNOSIS — L03116 Cellulitis of left lower limb: Secondary | ICD-10-CM | POA: Diagnosis not present

## 2024-04-27 DIAGNOSIS — I130 Hypertensive heart and chronic kidney disease with heart failure and stage 1 through stage 4 chronic kidney disease, or unspecified chronic kidney disease: Secondary | ICD-10-CM | POA: Diagnosis not present

## 2024-04-27 DIAGNOSIS — N1832 Chronic kidney disease, stage 3b: Secondary | ICD-10-CM | POA: Diagnosis not present

## 2024-04-27 DIAGNOSIS — I5032 Chronic diastolic (congestive) heart failure: Secondary | ICD-10-CM | POA: Diagnosis not present

## 2024-04-27 DIAGNOSIS — R6520 Severe sepsis without septic shock: Secondary | ICD-10-CM | POA: Diagnosis not present

## 2024-04-27 DIAGNOSIS — E1122 Type 2 diabetes mellitus with diabetic chronic kidney disease: Secondary | ICD-10-CM | POA: Diagnosis not present

## 2024-04-29 ENCOUNTER — Ambulatory Visit

## 2024-04-30 DIAGNOSIS — N179 Acute kidney failure, unspecified: Secondary | ICD-10-CM | POA: Diagnosis not present

## 2024-04-30 DIAGNOSIS — I130 Hypertensive heart and chronic kidney disease with heart failure and stage 1 through stage 4 chronic kidney disease, or unspecified chronic kidney disease: Secondary | ICD-10-CM | POA: Diagnosis not present

## 2024-04-30 DIAGNOSIS — L03116 Cellulitis of left lower limb: Secondary | ICD-10-CM | POA: Diagnosis not present

## 2024-04-30 DIAGNOSIS — R6520 Severe sepsis without septic shock: Secondary | ICD-10-CM | POA: Diagnosis not present

## 2024-04-30 DIAGNOSIS — J189 Pneumonia, unspecified organism: Secondary | ICD-10-CM | POA: Diagnosis not present

## 2024-04-30 DIAGNOSIS — I5032 Chronic diastolic (congestive) heart failure: Secondary | ICD-10-CM | POA: Diagnosis not present

## 2024-04-30 DIAGNOSIS — I4821 Permanent atrial fibrillation: Secondary | ICD-10-CM | POA: Diagnosis not present

## 2024-04-30 DIAGNOSIS — K766 Portal hypertension: Secondary | ICD-10-CM | POA: Diagnosis not present

## 2024-04-30 DIAGNOSIS — A411 Sepsis due to other specified staphylococcus: Secondary | ICD-10-CM | POA: Diagnosis not present

## 2024-04-30 DIAGNOSIS — D696 Thrombocytopenia, unspecified: Secondary | ICD-10-CM | POA: Diagnosis not present

## 2024-04-30 DIAGNOSIS — N1832 Chronic kidney disease, stage 3b: Secondary | ICD-10-CM | POA: Diagnosis not present

## 2024-04-30 DIAGNOSIS — K7460 Unspecified cirrhosis of liver: Secondary | ICD-10-CM | POA: Diagnosis not present

## 2024-04-30 DIAGNOSIS — E1122 Type 2 diabetes mellitus with diabetic chronic kidney disease: Secondary | ICD-10-CM | POA: Diagnosis not present

## 2024-05-01 ENCOUNTER — Encounter (HOSPITAL_BASED_OUTPATIENT_CLINIC_OR_DEPARTMENT_OTHER): Payer: Self-pay | Admitting: Nurse Practitioner

## 2024-05-01 ENCOUNTER — Ambulatory Visit (HOSPITAL_BASED_OUTPATIENT_CLINIC_OR_DEPARTMENT_OTHER): Admitting: Nurse Practitioner

## 2024-05-01 VITALS — BP 113/65 | HR 97 | Ht 67.0 in | Wt 293.6 lb

## 2024-05-01 DIAGNOSIS — I9589 Other hypotension: Secondary | ICD-10-CM

## 2024-05-01 DIAGNOSIS — N1832 Chronic kidney disease, stage 3b: Secondary | ICD-10-CM | POA: Diagnosis not present

## 2024-05-01 DIAGNOSIS — I5032 Chronic diastolic (congestive) heart failure: Secondary | ICD-10-CM

## 2024-05-01 DIAGNOSIS — K7581 Nonalcoholic steatohepatitis (NASH): Secondary | ICD-10-CM | POA: Diagnosis not present

## 2024-05-01 DIAGNOSIS — R0609 Other forms of dyspnea: Secondary | ICD-10-CM | POA: Diagnosis not present

## 2024-05-01 DIAGNOSIS — D696 Thrombocytopenia, unspecified: Secondary | ICD-10-CM

## 2024-05-01 DIAGNOSIS — K7460 Unspecified cirrhosis of liver: Secondary | ICD-10-CM

## 2024-05-01 DIAGNOSIS — I4891 Unspecified atrial fibrillation: Secondary | ICD-10-CM

## 2024-05-01 DIAGNOSIS — I890 Lymphedema, not elsewhere classified: Secondary | ICD-10-CM | POA: Diagnosis not present

## 2024-05-01 DIAGNOSIS — I350 Nonrheumatic aortic (valve) stenosis: Secondary | ICD-10-CM

## 2024-05-01 DIAGNOSIS — I4821 Permanent atrial fibrillation: Secondary | ICD-10-CM | POA: Diagnosis not present

## 2024-05-01 MED ORDER — METOPROLOL TARTRATE 50 MG PO TABS
ORAL_TABLET | ORAL | Status: DC
Start: 2024-05-01 — End: 2024-07-24
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
## 2024-05-01 NOTE — Patient Instructions (Addendum)
 Medication Instructions:  Your physician has recommended you make the following change in your medication:   REDUCE the Lopressor  to 1/2 tablet in the morning and keep it at a whole tablet in the pm.  *If you need a refill on your cardiac medications before your next appointment, please call your pharmacy*  Lab Work: None ordered  If you have labs (blood work) drawn today and your tests are completely normal, you will receive your results only by: MyChart Message (if you have MyChart) OR A paper copy in the mail If you have any lab test that is abnormal or we need to change your treatment, we will call you to review the results.  Testing/Procedures: None ordered  Follow-Up: At [facility], you and your health needs are our priority.  As part of our continuing mission to provide you with exceptional heart care, our providers are all part of one team.  This team includes your primary Cardiologist (physician) and Advanced Practice Providers or APPs (Physician Assistants and Nurse Practitioners) who all work together to provide you with the care you need, when you need it.  Your next appointment:   4-6 WEEKS  Provider:   [facility] DR. SABARWHAL      We recommend signing up for the patient portal called MyChart.  Sign up information is provided on this After Visit Summary.  MyChart is used to connect with patients for Virtual Visits (Telemedicine).  Patients are able to view lab/test results, encounter notes, upcoming appointments, etc.  Non-urgent messages can be sent to your provider as well.   To learn more about what you can do with MyChart, go to [email or web address].   Other Instructions

## 2024-05-02 ENCOUNTER — Ambulatory Visit

## 2024-05-02 DIAGNOSIS — A411 Sepsis due to other specified staphylococcus: Secondary | ICD-10-CM | POA: Diagnosis not present

## 2024-05-02 DIAGNOSIS — R6520 Severe sepsis without septic shock: Secondary | ICD-10-CM | POA: Diagnosis not present

## 2024-05-02 DIAGNOSIS — N179 Acute kidney failure, unspecified: Secondary | ICD-10-CM | POA: Diagnosis not present

## 2024-05-02 DIAGNOSIS — I5032 Chronic diastolic (congestive) heart failure: Secondary | ICD-10-CM | POA: Diagnosis not present

## 2024-05-02 DIAGNOSIS — I130 Hypertensive heart and chronic kidney disease with heart failure and stage 1 through stage 4 chronic kidney disease, or unspecified chronic kidney disease: Secondary | ICD-10-CM | POA: Diagnosis not present

## 2024-05-02 DIAGNOSIS — N1832 Chronic kidney disease, stage 3b: Secondary | ICD-10-CM | POA: Diagnosis not present

## 2024-05-02 DIAGNOSIS — L03116 Cellulitis of left lower limb: Secondary | ICD-10-CM | POA: Diagnosis not present

## 2024-05-02 DIAGNOSIS — J189 Pneumonia, unspecified organism: Secondary | ICD-10-CM | POA: Diagnosis not present

## 2024-05-02 DIAGNOSIS — E1122 Type 2 diabetes mellitus with diabetic chronic kidney disease: Secondary | ICD-10-CM | POA: Diagnosis not present

## 2024-05-07 DIAGNOSIS — E1122 Type 2 diabetes mellitus with diabetic chronic kidney disease: Secondary | ICD-10-CM | POA: Diagnosis not present

## 2024-05-07 DIAGNOSIS — I5032 Chronic diastolic (congestive) heart failure: Secondary | ICD-10-CM | POA: Diagnosis not present

## 2024-05-07 DIAGNOSIS — N1832 Chronic kidney disease, stage 3b: Secondary | ICD-10-CM | POA: Diagnosis not present

## 2024-05-07 DIAGNOSIS — A411 Sepsis due to other specified staphylococcus: Secondary | ICD-10-CM | POA: Diagnosis not present

## 2024-05-07 DIAGNOSIS — I130 Hypertensive heart and chronic kidney disease with heart failure and stage 1 through stage 4 chronic kidney disease, or unspecified chronic kidney disease: Secondary | ICD-10-CM | POA: Diagnosis not present

## 2024-05-07 DIAGNOSIS — R6520 Severe sepsis without septic shock: Secondary | ICD-10-CM | POA: Diagnosis not present

## 2024-05-07 DIAGNOSIS — L03116 Cellulitis of left lower limb: Secondary | ICD-10-CM | POA: Diagnosis not present

## 2024-05-07 DIAGNOSIS — N179 Acute kidney failure, unspecified: Secondary | ICD-10-CM | POA: Diagnosis not present

## 2024-05-07 DIAGNOSIS — J189 Pneumonia, unspecified organism: Secondary | ICD-10-CM | POA: Diagnosis not present

## 2024-05-13 LAB — HEMOGLOBIN A1C
Hgb A1c MFr Bld: 6 % — ABNORMAL HIGH (ref 4.8–5.6)
Mean Plasma Glucose: 126 mg/dL

## 2024-05-17 DIAGNOSIS — E1122 Type 2 diabetes mellitus with diabetic chronic kidney disease: Secondary | ICD-10-CM | POA: Diagnosis not present

## 2024-05-17 DIAGNOSIS — L03116 Cellulitis of left lower limb: Secondary | ICD-10-CM | POA: Diagnosis not present

## 2024-05-17 DIAGNOSIS — R6520 Severe sepsis without septic shock: Secondary | ICD-10-CM | POA: Diagnosis not present

## 2024-05-17 DIAGNOSIS — J189 Pneumonia, unspecified organism: Secondary | ICD-10-CM | POA: Diagnosis not present

## 2024-05-17 DIAGNOSIS — N1832 Chronic kidney disease, stage 3b: Secondary | ICD-10-CM | POA: Diagnosis not present

## 2024-05-17 DIAGNOSIS — I130 Hypertensive heart and chronic kidney disease with heart failure and stage 1 through stage 4 chronic kidney disease, or unspecified chronic kidney disease: Secondary | ICD-10-CM | POA: Diagnosis not present

## 2024-05-17 DIAGNOSIS — I5032 Chronic diastolic (congestive) heart failure: Secondary | ICD-10-CM | POA: Diagnosis not present

## 2024-05-17 DIAGNOSIS — N179 Acute kidney failure, unspecified: Secondary | ICD-10-CM | POA: Diagnosis not present

## 2024-05-17 DIAGNOSIS — A411 Sepsis due to other specified staphylococcus: Secondary | ICD-10-CM | POA: Diagnosis not present

## 2024-05-20 DIAGNOSIS — L03116 Cellulitis of left lower limb: Secondary | ICD-10-CM | POA: Diagnosis not present

## 2024-05-20 DIAGNOSIS — R6520 Severe sepsis without septic shock: Secondary | ICD-10-CM | POA: Diagnosis not present

## 2024-05-20 DIAGNOSIS — N179 Acute kidney failure, unspecified: Secondary | ICD-10-CM | POA: Diagnosis not present

## 2024-05-20 DIAGNOSIS — I130 Hypertensive heart and chronic kidney disease with heart failure and stage 1 through stage 4 chronic kidney disease, or unspecified chronic kidney disease: Secondary | ICD-10-CM | POA: Diagnosis not present

## 2024-05-20 DIAGNOSIS — J189 Pneumonia, unspecified organism: Secondary | ICD-10-CM | POA: Diagnosis not present

## 2024-05-20 DIAGNOSIS — I5032 Chronic diastolic (congestive) heart failure: Secondary | ICD-10-CM | POA: Diagnosis not present

## 2024-05-20 DIAGNOSIS — N1832 Chronic kidney disease, stage 3b: Secondary | ICD-10-CM | POA: Diagnosis not present

## 2024-05-20 DIAGNOSIS — E1122 Type 2 diabetes mellitus with diabetic chronic kidney disease: Secondary | ICD-10-CM | POA: Diagnosis not present

## 2024-05-20 DIAGNOSIS — A411 Sepsis due to other specified staphylococcus: Secondary | ICD-10-CM | POA: Diagnosis not present

## 2024-05-22 DIAGNOSIS — R6520 Severe sepsis without septic shock: Secondary | ICD-10-CM | POA: Diagnosis not present

## 2024-05-22 DIAGNOSIS — A411 Sepsis due to other specified staphylococcus: Secondary | ICD-10-CM | POA: Diagnosis not present

## 2024-05-22 DIAGNOSIS — N179 Acute kidney failure, unspecified: Secondary | ICD-10-CM | POA: Diagnosis not present

## 2024-05-22 DIAGNOSIS — N1832 Chronic kidney disease, stage 3b: Secondary | ICD-10-CM | POA: Diagnosis not present

## 2024-05-22 DIAGNOSIS — J189 Pneumonia, unspecified organism: Secondary | ICD-10-CM | POA: Diagnosis not present

## 2024-05-22 DIAGNOSIS — L03116 Cellulitis of left lower limb: Secondary | ICD-10-CM | POA: Diagnosis not present

## 2024-05-22 DIAGNOSIS — I5032 Chronic diastolic (congestive) heart failure: Secondary | ICD-10-CM | POA: Diagnosis not present

## 2024-05-22 DIAGNOSIS — E1122 Type 2 diabetes mellitus with diabetic chronic kidney disease: Secondary | ICD-10-CM | POA: Diagnosis not present

## 2024-05-22 DIAGNOSIS — I130 Hypertensive heart and chronic kidney disease with heart failure and stage 1 through stage 4 chronic kidney disease, or unspecified chronic kidney disease: Secondary | ICD-10-CM | POA: Diagnosis not present

## 2024-05-23 ENCOUNTER — Ambulatory Visit: Attending: Infectious Diseases | Admitting: Infectious Diseases

## 2024-05-23 DIAGNOSIS — L03116 Cellulitis of left lower limb: Secondary | ICD-10-CM | POA: Diagnosis not present

## 2024-05-23 DIAGNOSIS — R011 Cardiac murmur, unspecified: Secondary | ICD-10-CM | POA: Insufficient documentation

## 2024-05-23 DIAGNOSIS — R7881 Bacteremia: Secondary | ICD-10-CM | POA: Diagnosis not present

## 2024-05-23 DIAGNOSIS — Z09 Encounter for follow-up examination after completed treatment for conditions other than malignant neoplasm: Secondary | ICD-10-CM | POA: Diagnosis not present

## 2024-05-23 DIAGNOSIS — Z872 Personal history of diseases of the skin and subcutaneous tissue: Secondary | ICD-10-CM

## 2024-05-23 DIAGNOSIS — I358 Other nonrheumatic aortic valve disorders: Secondary | ICD-10-CM | POA: Insufficient documentation

## 2024-05-23 DIAGNOSIS — Z8619 Personal history of other infectious and parasitic diseases: Secondary | ICD-10-CM | POA: Diagnosis not present

## 2024-05-23 DIAGNOSIS — B957 Other staphylococcus as the cause of diseases classified elsewhere: Secondary | ICD-10-CM | POA: Insufficient documentation

## 2024-05-23 NOTE — Progress Notes (Signed)
 The purpose of this virtual visit is to provide medical care while limiting exposure to the novel coronavirus (COVID19) for both patient and office staff.   Consent was obtained for video visit:  Yes.   Answered questions that patient had about telehealth interaction:  Yes.   I discussed the limitations, risks, security and privacy concerns of performing an evaluation and management service by telephone. I also discussed with the patient that there may be a patient responsible charge related to this service. The patient expressed understanding and agreed to proceed.   Patient Location: Home Provider Location:office Patient and provider on the video chat Hannah Khan is a 68 year old female - this is a follow up visit after hospitlaization7/31-8/6 for left leg cellulitis and staph epidermidis bacteremia  She was hospitalized from July 31st to August 6th for cellulitis of the right thigh and bacteremia due to staph epi After receiving appropriate Iv antibiotic fir 7 days she received a long-acting Dalbavancin  injection before discharge , and the cellulitis has since resolved completely. No fever or chills are present.  She has a history of a systolic heart murmur. And we did 2 d echo with no vegetation-   She experiences difficulty sleeping and has tried over-the-counter Unisom, taking one pill an hour before bed for two to three days without improvement.  Her breathing has improved, although she noticed increased difficulty on a day she missed her 'flu pill' due to multiple appointments. She reports fluid retention but has successfully reduced her weight from 291 lbs during hospitalization to 276.8 lbs.   O/e awake and alert No distress   Impression/recommendation History of resolved left lower extremity cellulitis and staph epidermidis bacteremia with need for repeat blood cultures due to cardiac murmur   Cellulitis and bacteremia have resolved with appropriate iV antibiotic when she was  hospitalized with no current infection symptoms. Previous blood cultures may have been contaminated due to improper sterile technique. To ensure bacteremia resolution, schedule a blood culture at the [facility] in [place] on Wednesday after her cardiology appointment. Use sterile technique during the blood draw to avoid contamination. She should clean and shower before the blood draw.  H/o aortic valve disease   There are no current symptoms like fever or chills. Repeat blood cultures to rule out persistent bacteremia due to the risk of endocarditis.   Discussed the management with the patient in detail Total time spent on this video visit was 20 min

## 2024-05-27 NOTE — Progress Notes (Signed)
 ADVANCED HEART FAILURE CLINIC NOTE  Referring Physician: Regino Slater, MD  Primary Care: Regino Slater, MD Primary Cardiologist: Annabella Scarce, MD  HPI: Hannah Khan is a 68 y.o. female with HFpEF, morbid obesity, obstructive sleep apnea, NASH/cirrhosis, thrombocytopenia, type 2 diabetes, aortic stenosis, vasovagal syncope, chronic atrial fibrillation presenting today to establish care.  Her cardiac history dates back to June 2021 when she was diagnosed with atrial fibrillation.  At that time she decided to continue with medical management after seeing Dr. Waddell.  She is admitted in May 2022 with atrial fibrillation and hepatic encephalopathy.  Recurrent admission and 7/22 with acute on chronic HFpEF due to dietary indiscretion.  At that time she was started on Farxiga , Bumex  2 mg daily and metolazone  as needed.  She has been unable to start spironolactone  due to symptomatic hypotension.  Due to her underlying cirrhosis she was planned to undergo TIPS on April 2024 but the procedure was aborted due to a right atrial pressure of 23 mmHg. She presented previously for optimization prior to TIPS. After diuresis, we repeated a RHC which demonstrated improvement in RA pressures. Unfortunately, TIPS complicated by hemoperitoneum. Today she presents for follow up.   Interval hx:  - Reports that she was doing fairly well; however, while on a cruise she started vomiting blood likely secondary to variceal bleed. Reports that her hemoglobin was down to 7. She had to be transported to the hospital for 1U PRBC transfusion. No further hematemesis noted.  - Other than her GIB she has been doing fairly well from a HFPEF standpoint. Doing much better from a volume standpoint. No longer struggling with dyspnea. Compliant with meds. Motivated to travel to [place].    Current Outpatient Medications  Medication Sig Dispense Refill   albuterol  (VENTOLIN  HFA) 108 (90 Base) MCG/ACT inhaler Inhale 1-2 puffs  into the lungs every 6 (six) hours as needed for wheezing or shortness of breath.     Continuous Glucose Sensor (FREESTYLE LIBRE 3 PLUS SENSOR) MISC Apply 1 each topically as directed. Place on arm as directed and change every 15 days.     digoxin  (LANOXIN ) 0.125 MG tablet TAKE 1 TABLET BY MOUTH EVERY DAY 90 tablet 4   lactulose  (CHRONULAC ) 10 GM/15ML solution Take 20 g by mouth 3 (three) times daily.     metolazone  (ZAROXOLYN ) 2.5 MG tablet TAKE 1 TABLET (2.5 MG TOTAL) BY MOUTH AS NEEDED (AS DIRECTED BY HF CLINIC). 5 tablet 0   metoprolol  tartrate (LOPRESSOR ) 50 MG tablet Take 1/2 tablet by mouth in the a.m and 1 tablet by mouth in the p.m     midodrine  (PROAMATINE ) 10 MG tablet Take 1 tablet (10 mg total) by mouth 3 (three) times daily. 270 tablet 2   Multiple Vitamins-Minerals (PRESERVISION AREDS 2) CAPS Take 1 capsule by mouth daily.     ONETOUCH VERIO test strip 1 each 3 (three) times daily.     pantoprazole  (PROTONIX ) 40 MG tablet Take 40 mg by mouth 2 (two) times daily.     potassium chloride  (MICRO-K ) 10 MEQ CR capsule Take 4 capsules (40 mEq total) by mouth 2 (two) times daily. Take an extra 4 tablets when taking Metolazone .     Propylene Glycol (SYSTANE BALANCE) 0.6 % SOLN Place 1 drop into both eyes 2 (two) times daily.     spironolactone  (ALDACTONE ) 25 MG tablet TAKE 3 TABLETS BY MOUTH DAILY 270 tablet 1   torsemide  (DEMADEX ) 20 MG tablet Take 4 tablets (80 mg total) by  mouth in the morning and 2 tablets (40 mg total) in the evening     TRESIBA  FLEXTOUCH 100 UNIT/ML FlexTouch Pen Inject 32 Units into the skin daily. (TITRATE WHEN DIRECTED UP TO 50 UNITS)     XIFAXAN  550 MG TABS tablet Take 550 mg by mouth 2 (two) times daily.     No current facility-administered medications for this visit.   PHYSICAL EXAM: There were no vitals filed for this visit.  GENERAL: NAD Lungs- CTA CARDIAC:  JVP: 9 cm          Normal rate with regular rhythm. No murmur.  Pulses 2+. 1+ edema.  ABDOMEN:  Soft, non-tender, non-distended.  EXTREMITIES: Warm and well perfused.  NEUROLOGIC: No obvious FND   DATA REVIEW  ECG: 04/06/23: atrial fibrillation  as per my personal interpretation  ECHO: 03/07/23: LVEF 65%, normal RV function; RV is dilated with septal flattening as per my personal interpretation  CATH: HEMODYNAMICS: RA:                  10 mmHg (mean) RV:                  34/10 mmHg PA:                  37/18 mmHg (28 mean) PCWP:            23 mmHg (mean)                                      Estimated Fick CO/CI   8.5 L/min, 3.4 L/min/m2 Thermodilution CO/CI   9 L/min, 3. 6 L/min/m2                                                TPG                 5  mmHg                                              PVR                 0.5 Wood Units  PAPi                2.4     ASSESSMENT & PLAN:  Heart failure with preserved ejection fraction -RHC as noted above -Unsuccessful TIPS -Euvolemic to mildly hypervolemic today. Continue torsemide   100mg  daily; increase spironolactone  to 75mg  daily. Repeat BMP today & in 7-10 days.   2. NASH Cirrhosis - Child pugh B, MELD 15 - Diagnosed in 2009 - Hx of esophageal varices - x1 admission for hepatic encephalopathy with lactulose /rifaximin .  -Admitted for TIPS on 06/30/23 which was unsuccessful; performed baloon-occluded retrograde transvenous obliteration to treat gastric varices. Post-op course was complicated by hemoperitoneum and afib w/ RVR.  - Variceal bleed as noted above - Increase spironolactone  to 75mg  daily.   3. Atrial fibrillation -EKG with atrial fibrillation. -Anticoagulation being held due to persistent thrombocytopenia from underlying cirrhosis -Rate controlled today; doing well.   4. Thrombocytopenia - Secondary to cirrhosis; plt count 20-30K for the past month.  - Reviewed plt  count for hospitalization; stable thrombocytopenia.   5. CKDIIIB - Baseline sCr around 1.5-1.3 - Repeat BMP; increase spironolactone  to 75mg   daily.   6. Chronic lymphedema - Improved with wraps.   7. Hematemesis/variceal bleed - Had a upper GI bleed during her recent cruise trip.  - Reports she had her CBC checked by [facility]; up to 8.9.   Aditya Sabharwal Advanced Heart Failure Mechanical Circulatory Support

## 2024-05-28 ENCOUNTER — Telehealth (HOSPITAL_COMMUNITY): Payer: Self-pay

## 2024-05-28 ENCOUNTER — Other Ambulatory Visit

## 2024-05-28 NOTE — Telephone Encounter (Signed)
 Called to confirm/remind patient of their appointment at the [facility] on 05/29/24.   Appointment:   [x] Confirmed  [] Left mess   [] No answer/No voice mail  [] VM Full/unable to leave message  [] Phone not in service  Patient reminded to bring all medications and/or complete list.  Confirmed patient has transportation. Gave directions, instructed to utilize valet parking.

## 2024-05-29 ENCOUNTER — Ambulatory Visit (HOSPITAL_COMMUNITY): Payer: Self-pay | Admitting: Family Medicine

## 2024-05-29 ENCOUNTER — Other Ambulatory Visit

## 2024-05-29 ENCOUNTER — Ambulatory Visit (HOSPITAL_COMMUNITY)
Admission: RE | Admit: 2024-05-29 | Discharge: 2024-05-29 | Disposition: A | Discharge status: Home / Self Care | Source: Ambulatory Visit | Attending: Family Medicine | Admitting: Family Medicine

## 2024-05-29 ENCOUNTER — Other Ambulatory Visit: Payer: Self-pay

## 2024-05-29 ENCOUNTER — Other Ambulatory Visit (HOSPITAL_COMMUNITY): Payer: Self-pay | Admitting: Family Medicine

## 2024-05-29 ENCOUNTER — Encounter (HOSPITAL_COMMUNITY): Payer: Self-pay

## 2024-05-29 ENCOUNTER — Telehealth (HOSPITAL_COMMUNITY): Payer: Self-pay | Admitting: *Deleted

## 2024-05-29 VITALS — BP 98/62 | HR 87 | Ht 67.0 in | Wt 282.0 lb

## 2024-05-29 DIAGNOSIS — Z8719 Personal history of other diseases of the digestive system: Secondary | ICD-10-CM | POA: Diagnosis not present

## 2024-05-29 DIAGNOSIS — I4821 Permanent atrial fibrillation: Secondary | ICD-10-CM | POA: Diagnosis not present

## 2024-05-29 DIAGNOSIS — D696 Thrombocytopenia, unspecified: Secondary | ICD-10-CM | POA: Insufficient documentation

## 2024-05-29 DIAGNOSIS — I5032 Chronic diastolic (congestive) heart failure: Secondary | ICD-10-CM

## 2024-05-29 DIAGNOSIS — E876 Hypokalemia: Secondary | ICD-10-CM

## 2024-05-29 DIAGNOSIS — I8511 Secondary esophageal varices with bleeding: Secondary | ICD-10-CM | POA: Diagnosis not present

## 2024-05-29 DIAGNOSIS — R7881 Bacteremia: Secondary | ICD-10-CM

## 2024-05-29 DIAGNOSIS — K7581 Nonalcoholic steatohepatitis (NASH): Secondary | ICD-10-CM | POA: Diagnosis not present

## 2024-05-29 DIAGNOSIS — R55 Syncope and collapse: Secondary | ICD-10-CM | POA: Insufficient documentation

## 2024-05-29 DIAGNOSIS — Z794 Long term (current) use of insulin: Secondary | ICD-10-CM | POA: Diagnosis not present

## 2024-05-29 DIAGNOSIS — I482 Chronic atrial fibrillation, unspecified: Secondary | ICD-10-CM | POA: Insufficient documentation

## 2024-05-29 DIAGNOSIS — G4733 Obstructive sleep apnea (adult) (pediatric): Secondary | ICD-10-CM | POA: Diagnosis not present

## 2024-05-29 DIAGNOSIS — I959 Hypotension, unspecified: Secondary | ICD-10-CM | POA: Diagnosis not present

## 2024-05-29 DIAGNOSIS — Z6841 Body Mass Index (BMI) 40.0 and over, adult: Secondary | ICD-10-CM | POA: Diagnosis not present

## 2024-05-29 DIAGNOSIS — I890 Lymphedema, not elsewhere classified: Secondary | ICD-10-CM | POA: Insufficient documentation

## 2024-05-29 DIAGNOSIS — N1832 Chronic kidney disease, stage 3b: Secondary | ICD-10-CM | POA: Diagnosis not present

## 2024-05-29 DIAGNOSIS — K219 Gastro-esophageal reflux disease without esophagitis: Secondary | ICD-10-CM | POA: Diagnosis not present

## 2024-05-29 DIAGNOSIS — I10 Essential (primary) hypertension: Secondary | ICD-10-CM

## 2024-05-29 DIAGNOSIS — K7460 Unspecified cirrhosis of liver: Secondary | ICD-10-CM | POA: Diagnosis not present

## 2024-05-29 DIAGNOSIS — E1122 Type 2 diabetes mellitus with diabetic chronic kidney disease: Secondary | ICD-10-CM | POA: Insufficient documentation

## 2024-05-29 DIAGNOSIS — I350 Nonrheumatic aortic (valve) stenosis: Secondary | ICD-10-CM | POA: Diagnosis not present

## 2024-05-29 LAB — BASIC METABOLIC PANEL WITH GFR
Anion gap: 8 (ref 5–15)
BUN: 34 mg/dL — ABNORMAL HIGH (ref 8–23)
CO2: 24 mmol/L (ref 22–32)
Calcium: 9.1 mg/dL (ref 8.9–10.3)
Chloride: 99 mmol/L (ref 98–111)
Creatinine, Ser: 1.83 mg/dL — ABNORMAL HIGH (ref 0.44–1.00)
GFR, Estimated: 30 mL/min — ABNORMAL LOW (ref >60)
Glucose, Bld: 179 mg/dL — ABNORMAL HIGH (ref 70–99)
Potassium: 4.2 mmol/L (ref 3.5–5.1)
Sodium: 131 mmol/L — ABNORMAL LOW (ref 135–145)

## 2024-05-29 LAB — DIGOXIN LEVEL: Digoxin Level: 1.2 ng/mL (ref 0.8–2.0)

## 2024-05-29 MED ORDER — METOLAZONE 2.5 MG PO TABS: 2.5000 mg | ORAL_TABLET | ORAL | 1 refills | Status: AC | PRN

## 2024-05-29 MED ORDER — MIDODRINE HCL 10 MG PO TABS: 15.0000 mg | ORAL_TABLET | Freq: Three times a day (TID) | ORAL | 11 refills | Status: AC

## 2024-05-29 MED ORDER — METOLAZONE 5 MG PO TABS: 15.0000 mg | ORAL_TABLET | Freq: Three times a day (TID) | ORAL | 11 refills | Status: DC

## 2024-05-29 NOTE — Addendum Note (Signed)
 Addended by: GRETEL TULLY HERO on: 05/29/2024 01:03 PM   Modules accepted: Orders

## 2024-05-29 NOTE — Telephone Encounter (Signed)
 Called patient per Hannah Gainer, NP with following labs results and instructions:  Dig level elevated but this was not a trough. Please bring back for a digoxin  trough in next couple of weeks.  Pt verbalized understanding of same and will hold Digoxin  morning of lab. Lab scheduled and ordered.

## 2024-05-29 NOTE — Patient Instructions (Addendum)
 Increase Midodrine  to 15 mg three times daily - updated Rx sent. Labs today - will call you if abnormal. Return to see Dr. Gardenia in 4 months.  PLEASE CALL [phone] IN DECEMBER TO SCHEDULE THIS APPOINTMENT.  Please call [phone] if any questions or concerns prior to your next visit.

## 2024-05-29 NOTE — Telephone Encounter (Signed)
 Upon further review, midodrine  was supposed to be increased not metolazone . Nurse for APP clinic today is resending correctly. PA not needed at this time.

## 2024-05-30 DIAGNOSIS — A411 Sepsis due to other specified staphylococcus: Secondary | ICD-10-CM | POA: Diagnosis not present

## 2024-05-30 DIAGNOSIS — N1832 Chronic kidney disease, stage 3b: Secondary | ICD-10-CM | POA: Diagnosis not present

## 2024-05-30 DIAGNOSIS — I5032 Chronic diastolic (congestive) heart failure: Secondary | ICD-10-CM | POA: Diagnosis not present

## 2024-05-30 DIAGNOSIS — N179 Acute kidney failure, unspecified: Secondary | ICD-10-CM | POA: Diagnosis not present

## 2024-05-30 DIAGNOSIS — E1122 Type 2 diabetes mellitus with diabetic chronic kidney disease: Secondary | ICD-10-CM | POA: Diagnosis not present

## 2024-05-30 DIAGNOSIS — L03116 Cellulitis of left lower limb: Secondary | ICD-10-CM | POA: Diagnosis not present

## 2024-05-30 DIAGNOSIS — I130 Hypertensive heart and chronic kidney disease with heart failure and stage 1 through stage 4 chronic kidney disease, or unspecified chronic kidney disease: Secondary | ICD-10-CM | POA: Diagnosis not present

## 2024-05-30 DIAGNOSIS — R6520 Severe sepsis without septic shock: Secondary | ICD-10-CM | POA: Diagnosis not present

## 2024-05-30 DIAGNOSIS — J189 Pneumonia, unspecified organism: Secondary | ICD-10-CM | POA: Diagnosis not present

## 2024-06-03 DIAGNOSIS — I130 Hypertensive heart and chronic kidney disease with heart failure and stage 1 through stage 4 chronic kidney disease, or unspecified chronic kidney disease: Secondary | ICD-10-CM | POA: Diagnosis not present

## 2024-06-03 DIAGNOSIS — N179 Acute kidney failure, unspecified: Secondary | ICD-10-CM | POA: Diagnosis not present

## 2024-06-03 DIAGNOSIS — N1832 Chronic kidney disease, stage 3b: Secondary | ICD-10-CM | POA: Diagnosis not present

## 2024-06-03 DIAGNOSIS — J189 Pneumonia, unspecified organism: Secondary | ICD-10-CM | POA: Diagnosis not present

## 2024-06-03 DIAGNOSIS — E1122 Type 2 diabetes mellitus with diabetic chronic kidney disease: Secondary | ICD-10-CM | POA: Diagnosis not present

## 2024-06-03 DIAGNOSIS — I5032 Chronic diastolic (congestive) heart failure: Secondary | ICD-10-CM | POA: Diagnosis not present

## 2024-06-03 DIAGNOSIS — R6520 Severe sepsis without septic shock: Secondary | ICD-10-CM | POA: Diagnosis not present

## 2024-06-03 DIAGNOSIS — A411 Sepsis due to other specified staphylococcus: Secondary | ICD-10-CM | POA: Diagnosis not present

## 2024-06-04 ENCOUNTER — Ambulatory Visit: Payer: Self-pay

## 2024-06-04 LAB — CULTURE, BLOOD (SINGLE)
MICRO NUMBER:: 16950316
MICRO NUMBER:: 16950322
Result:: NO GROWTH
SPECIMEN QUALITY:: ADEQUATE

## 2024-06-10 ENCOUNTER — Other Ambulatory Visit: Payer: Self-pay

## 2024-06-10 ENCOUNTER — Encounter (HOSPITAL_BASED_OUTPATIENT_CLINIC_OR_DEPARTMENT_OTHER): Payer: Self-pay | Admitting: Emergency Medicine

## 2024-06-10 ENCOUNTER — Inpatient Hospital Stay (HOSPITAL_BASED_OUTPATIENT_CLINIC_OR_DEPARTMENT_OTHER)
Admission: EM | Admit: 2024-06-10 | Discharge: 2024-06-13 | DRG: 812 | Disposition: A | Discharge status: Home / Self Care | Source: Other Acute Inpatient Hospital | Attending: Internal Medicine | Admitting: Internal Medicine

## 2024-06-10 DIAGNOSIS — I5032 Chronic diastolic (congestive) heart failure: Secondary | ICD-10-CM | POA: Diagnosis present

## 2024-06-10 DIAGNOSIS — E119 Type 2 diabetes mellitus without complications: Secondary | ICD-10-CM | POA: Diagnosis not present

## 2024-06-10 DIAGNOSIS — I864 Gastric varices: Secondary | ICD-10-CM | POA: Diagnosis not present

## 2024-06-10 DIAGNOSIS — Z8249 Family history of ischemic heart disease and other diseases of the circulatory system: Secondary | ICD-10-CM

## 2024-06-10 DIAGNOSIS — H353221 Exudative age-related macular degeneration, left eye, with active choroidal neovascularization: Secondary | ICD-10-CM | POA: Diagnosis not present

## 2024-06-10 DIAGNOSIS — K7460 Unspecified cirrhosis of liver: Secondary | ICD-10-CM

## 2024-06-10 DIAGNOSIS — Z713 Dietary counseling and surveillance: Secondary | ICD-10-CM

## 2024-06-10 DIAGNOSIS — D509 Iron deficiency anemia, unspecified: Secondary | ICD-10-CM | POA: Diagnosis not present

## 2024-06-10 DIAGNOSIS — I350 Nonrheumatic aortic (valve) stenosis: Secondary | ICD-10-CM

## 2024-06-10 DIAGNOSIS — Z885 Allergy status to narcotic agent status: Secondary | ICD-10-CM

## 2024-06-10 DIAGNOSIS — Z794 Long term (current) use of insulin: Secondary | ICD-10-CM

## 2024-06-10 DIAGNOSIS — M797 Fibromyalgia: Secondary | ICD-10-CM | POA: Diagnosis present

## 2024-06-10 DIAGNOSIS — E113593 Type 2 diabetes mellitus with proliferative diabetic retinopathy without macular edema, bilateral: Secondary | ICD-10-CM

## 2024-06-10 DIAGNOSIS — I872 Venous insufficiency (chronic) (peripheral): Secondary | ICD-10-CM | POA: Diagnosis present

## 2024-06-10 DIAGNOSIS — K766 Portal hypertension: Secondary | ICD-10-CM | POA: Diagnosis present

## 2024-06-10 DIAGNOSIS — E876 Hypokalemia: Secondary | ICD-10-CM

## 2024-06-10 DIAGNOSIS — M170 Bilateral primary osteoarthritis of knee: Secondary | ICD-10-CM | POA: Diagnosis present

## 2024-06-10 DIAGNOSIS — E1122 Type 2 diabetes mellitus with diabetic chronic kidney disease: Secondary | ICD-10-CM | POA: Diagnosis present

## 2024-06-10 DIAGNOSIS — Z91013 Allergy to seafood: Secondary | ICD-10-CM

## 2024-06-10 DIAGNOSIS — N179 Acute kidney failure, unspecified: Secondary | ICD-10-CM | POA: Diagnosis not present

## 2024-06-10 DIAGNOSIS — Z9049 Acquired absence of other specified parts of digestive tract: Secondary | ICD-10-CM

## 2024-06-10 DIAGNOSIS — I130 Hypertensive heart and chronic kidney disease with heart failure and stage 1 through stage 4 chronic kidney disease, or unspecified chronic kidney disease: Secondary | ICD-10-CM | POA: Diagnosis present

## 2024-06-10 DIAGNOSIS — K625 Hemorrhage of anus and rectum: Secondary | ICD-10-CM | POA: Diagnosis not present

## 2024-06-10 DIAGNOSIS — Z886 Allergy status to analgesic agent status: Secondary | ICD-10-CM

## 2024-06-10 DIAGNOSIS — R161 Splenomegaly, not elsewhere classified: Secondary | ICD-10-CM | POA: Diagnosis present

## 2024-06-10 DIAGNOSIS — I8510 Secondary esophageal varices without bleeding: Secondary | ICD-10-CM | POA: Diagnosis present

## 2024-06-10 DIAGNOSIS — D649 Anemia, unspecified: Secondary | ICD-10-CM | POA: Diagnosis not present

## 2024-06-10 DIAGNOSIS — Z79899 Other long term (current) drug therapy: Secondary | ICD-10-CM

## 2024-06-10 DIAGNOSIS — H53483 Generalized contraction of visual field, bilateral: Secondary | ICD-10-CM | POA: Diagnosis not present

## 2024-06-10 DIAGNOSIS — I4821 Permanent atrial fibrillation: Secondary | ICD-10-CM | POA: Diagnosis not present

## 2024-06-10 DIAGNOSIS — D62 Acute posthemorrhagic anemia: Secondary | ICD-10-CM | POA: Diagnosis not present

## 2024-06-10 DIAGNOSIS — K922 Gastrointestinal hemorrhage, unspecified: Secondary | ICD-10-CM | POA: Diagnosis present

## 2024-06-10 DIAGNOSIS — H35033 Hypertensive retinopathy, bilateral: Secondary | ICD-10-CM | POA: Diagnosis not present

## 2024-06-10 DIAGNOSIS — Z888 Allergy status to other drugs, medicaments and biological substances status: Secondary | ICD-10-CM

## 2024-06-10 DIAGNOSIS — E785 Hyperlipidemia, unspecified: Secondary | ICD-10-CM | POA: Diagnosis present

## 2024-06-10 DIAGNOSIS — I890 Lymphedema, not elsewhere classified: Secondary | ICD-10-CM

## 2024-06-10 DIAGNOSIS — K7581 Nonalcoholic steatohepatitis (NASH): Secondary | ICD-10-CM | POA: Diagnosis present

## 2024-06-10 DIAGNOSIS — Z6841 Body Mass Index (BMI) 40.0 and over, adult: Secondary | ICD-10-CM | POA: Diagnosis not present

## 2024-06-10 DIAGNOSIS — I9589 Other hypotension: Secondary | ICD-10-CM | POA: Diagnosis present

## 2024-06-10 DIAGNOSIS — K7290 Hepatic failure, unspecified without coma: Secondary | ICD-10-CM | POA: Diagnosis not present

## 2024-06-10 DIAGNOSIS — K3189 Other diseases of stomach and duodenum: Secondary | ICD-10-CM | POA: Diagnosis present

## 2024-06-10 DIAGNOSIS — N1832 Chronic kidney disease, stage 3b: Secondary | ICD-10-CM | POA: Diagnosis present

## 2024-06-10 DIAGNOSIS — I959 Hypotension, unspecified: Secondary | ICD-10-CM

## 2024-06-10 DIAGNOSIS — H353112 Nonexudative age-related macular degeneration, right eye, intermediate dry stage: Secondary | ICD-10-CM | POA: Diagnosis not present

## 2024-06-10 DIAGNOSIS — I10 Essential (primary) hypertension: Secondary | ICD-10-CM | POA: Diagnosis present

## 2024-06-10 DIAGNOSIS — E66813 Obesity, class 3: Secondary | ICD-10-CM | POA: Diagnosis not present

## 2024-06-10 DIAGNOSIS — Z860100 Personal history of colon polyps, unspecified: Secondary | ICD-10-CM

## 2024-06-10 DIAGNOSIS — D61818 Other pancytopenia: Secondary | ICD-10-CM | POA: Diagnosis not present

## 2024-06-10 DIAGNOSIS — I4819 Other persistent atrial fibrillation: Secondary | ICD-10-CM | POA: Diagnosis not present

## 2024-06-10 DIAGNOSIS — Z83438 Family history of other disorder of lipoprotein metabolism and other lipidemia: Secondary | ICD-10-CM

## 2024-06-10 DIAGNOSIS — Z8719 Personal history of other diseases of the digestive system: Secondary | ICD-10-CM | POA: Diagnosis not present

## 2024-06-10 DIAGNOSIS — I8500 Esophageal varices without bleeding: Secondary | ICD-10-CM | POA: Diagnosis not present

## 2024-06-10 DIAGNOSIS — H43813 Vitreous degeneration, bilateral: Secondary | ICD-10-CM | POA: Diagnosis not present

## 2024-06-10 DIAGNOSIS — Z87891 Personal history of nicotine dependence: Secondary | ICD-10-CM

## 2024-06-10 DIAGNOSIS — G4733 Obstructive sleep apnea (adult) (pediatric): Secondary | ICD-10-CM | POA: Diagnosis present

## 2024-06-10 LAB — PREPARE RBC (CROSSMATCH)

## 2024-06-10 LAB — OCCULT BLOOD X 1 CARD TO LAB, STOOL: Fecal Occult Bld: NEGATIVE

## 2024-06-10 LAB — CBC
HCT: 18.7 % — ABNORMAL LOW (ref 36.0–46.0)
Hemoglobin: 5.6 g/dL — CL (ref 12.0–15.0)
MCH: 26.4 pg (ref 26.0–34.0)
MCHC: 29.9 g/dL — ABNORMAL LOW (ref 30.0–36.0)
MCV: 88.2 fL (ref 80.0–100.0)
Platelets: 28 K/uL — CL (ref 150–400)
RBC: 2.12 MIL/uL — ABNORMAL LOW (ref 3.87–5.11)
RDW: 17.1 % — ABNORMAL HIGH (ref 11.5–15.5)
WBC: 3.1 K/uL — ABNORMAL LOW (ref 4.0–10.5)
nRBC: 0 % (ref 0.0–0.2)

## 2024-06-10 LAB — BASIC METABOLIC PANEL WITH GFR
Anion gap: 11 (ref 5–15)
BUN: 42 mg/dL — ABNORMAL HIGH (ref 8–23)
CO2: 23 mmol/L (ref 22–32)
Calcium: 9.1 mg/dL (ref 8.9–10.3)
Chloride: 103 mmol/L (ref 98–111)
Creatinine, Ser: 2.07 mg/dL — ABNORMAL HIGH (ref 0.44–1.00)
GFR, Estimated: 26 mL/min — ABNORMAL LOW (ref >60)
Glucose, Bld: 196 mg/dL — ABNORMAL HIGH (ref 70–99)
Potassium: 4.4 mmol/L (ref 3.5–5.1)
Sodium: 136 mmol/L (ref 135–145)

## 2024-06-10 LAB — PROTIME-INR
INR: 1.3 — ABNORMAL HIGH (ref 0.8–1.2)
Prothrombin Time: 16.7 s — ABNORMAL HIGH (ref 11.4–15.2)

## 2024-06-10 MED ORDER — SODIUM CHLORIDE 0.9% IV SOLUTION: Freq: Once | INTRAVENOUS | Status: AC

## 2024-06-10 NOTE — Progress Notes (Signed)
 Plan of Care Note for accepted transfer   Patient: Hannah Khan MRN: [id number]   DOA: 06/10/2024  Facility requesting transfer: [facility]   Requesting Provider: Dr. Elnor   Reason for transfer: Symptomatic anemia   Facility course: 68 yr old female with T2DM, OSA, a fib not anticoagulated, HFpEF, NASH cirrhosis, and chronic anemia and thrombocytopenia presents with low Hgb on outpatient blood work.   Patient reported BRBPR but has negative FOBT. WBC is 3.1, Hgb 5.6 (7.4 on 04/24/24), and platelets 28 (31 on 04/24/24).   GI (Dr. Saintclair) was consulted by the ED physician and recommended transfusing 1 unit RBCs, no platelet transfusion, and to check INR and give 1 unit FFP and 1 mg vit K if >3.   Plan of care: The patient is accepted for admission to Progressive unit, at [facility].   Author: Evalene GORMAN Sprinkles, MD 06/10/2024  Check [email or web address] for on-call coverage.  Nursing staff, Please call [facility] Admits & Consults System-Wide number on Amion as soon as patient's arrival, so appropriate admitting provider can evaluate the pt.

## 2024-06-10 NOTE — ED Provider Notes (Signed)
  Physical Exam  BP 114/66   Pulse (!) 107   Temp 98.1 F (36.7 C) (Oral)   Resp (!) 21   Ht 5' 7 (1.702 m)   Wt 127 kg   SpO2 100%   BMI 43.85 kg/m   Physical Exam  Procedures  Procedures  ED Course / MDM    Medical Decision Making Amount and/or Complexity of Data Reviewed Labs: ordered.  Risk Prescription drug management. Decision regarding hospitalization.   Artery extensive by the hospitalist.  Patient sent to this ED from outside ED for blood transfusion.  Went ahead and ordered 2 units of packed red blood cells.  Discussed with the patient.  She is in agreement with receiving packed red blood cells.  She is hemodynamically stable.       Simon Lavonia SAILOR, MD 06/10/24 2312

## 2024-06-10 NOTE — ED Triage Notes (Addendum)
 States  had labs drawn today and was told her HGB was 6.8  here for blood transfusion also saw her eye dr and she has had graying vision for a month and  dr thought she might have had a stroke , started a month ago

## 2024-06-10 NOTE — ED Notes (Addendum)
 ED doctor and admitting hospital ist notified of pt arrival.

## 2024-06-10 NOTE — ED Provider Notes (Signed)
 [place] [facility] Provider Note   CSN: [id number] Arrival date & time: 06/10/24  1740     Patient presents with: GI Bleeding   Hannah Khan is a 68 y.o. female.   Patient is a 68 year old female with past medical history of liver cirrhosis secondary to NASH with esophageal varices and gastric varices, as well as heart failure with preserved ejection fraction and atrial fibrillation (no anticoagulant use), diabetes mellitus, chronic kidney disease presenting for complaints of bright red blood per rectum.  Patient states she has had multiple episodes of diarrhea and constipation, taking lactulose  for liver cirrhosis, and has recently been having bright red blood on her tissue paper while wiping.  No blood in the stool or in the toilet.  She admits to conversational dyspnea, lightheadedness.  Was at her PCPs office today and had blood work sent and was called about a hemoglobin of 6.5 told to go to the emergency department.  Last GI note I can see was on 04/19/2024 with [facility]: Cirrhosis secondary to metabolic associated liver disease decompensated by esophageal varices and gastric varices.  EGD and colonoscopy 05/04/2022 showed small esophageal varix, isolated gastric varix, portal hypertensive gastropathy and diverticulosis.  BRTO of gastric varix completed on 06/30/2023, TIPS attempted at that time though unsuccessful and complicated by hemoperitoneum.    The history is provided by the patient. No language interpreter was used.       Prior to Admission medications   Medication Sig Start Date End Date Taking? Authorizing Provider  albuterol  (VENTOLIN  HFA) 108 (90 Base) MCG/ACT inhaler Inhale 1-2 puffs into the lungs every 6 (six) hours as needed for wheezing or shortness of breath.    [provider]  Continuous Glucose Sensor (FREESTYLE LIBRE 3 PLUS SENSOR) MISC Apply 1 each topically as directed. Place on arm as directed and change  every 15 days. 01/12/24   [provider]  digoxin  (LANOXIN ) 0.125 MG tablet TAKE 1 TABLET BY MOUTH EVERY DAY 04/17/24   Sabharwal, Aditya, DO  lactulose  (CHRONULAC ) 10 GM/15ML solution Take 20 g by mouth 3 (three) times daily. 03/17/21   [provider]  methocarbamol  (ROBAXIN ) 750 MG tablet 1 tablet Orally every 8 hrs; Duration: 5 days 04/18/24   [provider]  metolazone  (ZAROXOLYN ) 2.5 MG tablet Take 1 tablet (2.5 mg total) by mouth as needed. 05/29/24   Glena Harlene HERO, FNP  metoprolol  tartrate (LOPRESSOR ) 50 MG tablet Take 1/2 tablet by mouth in the a.m and 1 tablet by mouth in the p.m 05/01/24   Swinyer, Rosaline HERO, NP  midodrine  (PROAMATINE ) 10 MG tablet Take 1.5 tablets (15 mg total) by mouth 3 (three) times daily. 05/29/24   Milford, Harlene HERO, FNP  Multiple Vitamins-Minerals (PRESERVISION AREDS 2) CAPS Take 1 capsule by mouth daily.    [provider]  [facility] VERIO test strip 1 each 3 (three) times daily. 04/13/20   [provider]  pantoprazole  (PROTONIX ) 40 MG tablet Take 40 mg by mouth 2 (two) times daily. 11/17/23   [provider]  potassium chloride  (MICRO-K ) 10 MEQ CR capsule Take 4 capsules (40 mEq total) by mouth 2 (two) times daily. Take an extra 4 tablets when taking Metolazone . 04/24/24   Jillian Buttery, MD  Propylene Glycol (SYSTANE BALANCE) 0.6 % SOLN Place 1 drop into both eyes 2 (two) times daily.    [provider]  spironolactone  (ALDACTONE ) 25 MG tablet TAKE 3 TABLETS BY MOUTH DAILY 03/05/24  Sabharwal, Aditya, DO  torsemide  (DEMADEX ) 20 MG tablet Take 4 tablets (80 mg total) by mouth in the morning and 2 tablets (40 mg total) in the evening 04/24/24   Jillian Buttery, MD  TRESIBA  FLEXTOUCH 100 UNIT/ML FlexTouch Pen Inject 32 Units into the skin daily. (TITRATE WHEN DIRECTED UP TO 50 UNITS) 01/22/24   [provider]  XIFAXAN  550 MG TABS tablet Take 550 mg by mouth 2 (two) times daily. 03/13/21   [provider]    Allergies: Celebrex [celecoxib], Shellfish allergy, Barium-containing compounds, Fentanyl , Nsaids, Prednisone, Sglt2 inhibitors, Statins, Tylenol  [acetaminophen ], Ibuprofen , Tramadol , and Zetia [ezetimibe]    Review of Systems  Constitutional:  Negative for chills and fever.  HENT:  Negative for ear pain and sore throat.   Eyes:  Negative for pain and visual disturbance.  Respiratory:  Positive for shortness of breath. Negative for cough.   Cardiovascular:  Negative for chest pain and palpitations.  Gastrointestinal:  Negative for abdominal pain and vomiting.       Blood from rectum when wiping   Genitourinary:  Negative for dysuria and hematuria.  Musculoskeletal:  Negative for arthralgias and back pain.  Skin:  Negative for color change and rash.  Neurological:  Positive for light-headedness. Negative for seizures and syncope.  All other systems reviewed and are negative.   Updated Vital Signs BP (!) 132/54   Pulse (!) 101   Temp 97.9 F (36.6 C) (Oral)   Resp 18   Ht 5' 7 (1.702 m)   Wt 127 kg   SpO2 96%   BMI 43.85 kg/m   Physical Exam Vitals and nursing note reviewed. Exam conducted with a chaperone present.  Constitutional:      General: She is not in acute distress.    Appearance: She is well-developed.  HENT:     Head: Normocephalic and atraumatic.  Eyes:     Conjunctiva/sclera: Conjunctivae normal.  Cardiovascular:     Rate and Rhythm: Normal rate and regular rhythm.     Heart sounds: No murmur heard. Pulmonary:     Effort: Pulmonary effort is normal. No respiratory distress.     Breath sounds: Normal breath sounds.  Abdominal:     Palpations: Abdomen is soft.     Tenderness: There is no abdominal tenderness.  Genitourinary:    Rectum: Normal. Guaiac result negative. No tenderness, anal fissure, external hemorrhoid or internal hemorrhoid. Normal anal tone.  Musculoskeletal:        General: No swelling.     Cervical back: Neck supple.   Skin:    General: Skin is warm and dry.     Capillary Refill: Capillary refill takes less than 2 seconds.  Neurological:     Mental Status: She is alert.  Psychiatric:        Mood and Affect: Mood normal.     (all labs ordered are listed, but only abnormal results are displayed) Labs Reviewed  CBC - Abnormal; Notable for the following components:      Result Value   WBC 3.1 (*)    RBC 2.12 (*)    Hemoglobin 5.6 (*)    HCT 18.7 (*)    MCHC 29.9 (*)    RDW 17.1 (*)    Platelets 28 (*)    All other components within normal limits  BASIC METABOLIC PANEL WITH GFR - Abnormal; Notable for the following components:   Glucose, Bld 196 (*)    BUN 42 (*)    Creatinine, Ser 2.07 (*)  GFR, Estimated 26 (*)    All other components within normal limits  OCCULT BLOOD X 1 CARD TO LAB, STOOL    EKG: None  Radiology: No results found.   .Critical Care  Performed by: Elnor Bernarda SQUIBB, DO Authorized by: Elnor Bernarda SQUIBB, DO   Critical care provider statement:    Critical care time (minutes):  101   Critical care was time spent personally by me on the following activities:  Development of treatment plan with patient or surrogate, discussions with consultants, evaluation of patient's response to treatment, examination of patient, ordering and review of laboratory studies, ordering and review of radiographic studies, ordering and performing treatments and interventions, pulse oximetry, re-evaluation of patient's condition and review of old charts   Care discussed with: admitting provider     Care discussed with comment:  Gi    Medications Ordered in the ED - No data to display                                  Medical Decision Making Amount and/or Complexity of Data Reviewed Labs: ordered.  Risk Decision regarding hospitalization.   68 year old female with past medical history of liver cirrhosis secondary to NASH with esophageal varices presenting for complaints of bright red  blood per rectum.  Patient is alert and oriented x 3, no acute distress, afebrile, tachycardia of 101 bpm with otherwise stable vital signs.  [place]ent soft and nontender.  Rectal exam is normal with no gross blood, no external or internal hemorrhoids.  No rectal fissures.  Hemoccult test negative.  Patient states her PCP noted that her hemoglobin level recorded today was 6 point something.  On her repeat blood drawl in the emergency department her hemoglobin is 5.6.  No active bleeding.  Platelets 28.  Chart review demonstrates patient has seen [facility] in the past.  Will consult their team and admit patient for transfer for blood transfusion with admission due to her complicated history.  Patient accepted for ED to ED transfer to initiate blood transfusion by Dr. Jackquline.  I spoke with [facility] team who will see the patient in the morning.  Agreeing with 1 unit packed red blood cell blood transfusion once patient is transferred.  Patient's hemoglobin level typically around 7.  Patient's platelet level at her baseline.  Recommending 1 unit of vitamin K and 1 unit of FFP if INR is greater than 3.  No treatment of INR is less than 3.     Final diagnoses:  Rectal hemorrhage  Anemia, unspecified type    ED Discharge Orders     None          Elnor Bernarda SQUIBB, DO 06/25/24 1507

## 2024-06-11 DIAGNOSIS — I130 Hypertensive heart and chronic kidney disease with heart failure and stage 1 through stage 4 chronic kidney disease, or unspecified chronic kidney disease: Secondary | ICD-10-CM | POA: Diagnosis present

## 2024-06-11 DIAGNOSIS — M170 Bilateral primary osteoarthritis of knee: Secondary | ICD-10-CM | POA: Diagnosis present

## 2024-06-11 DIAGNOSIS — M797 Fibromyalgia: Secondary | ICD-10-CM | POA: Diagnosis present

## 2024-06-11 DIAGNOSIS — D649 Anemia, unspecified: Secondary | ICD-10-CM | POA: Diagnosis present

## 2024-06-11 DIAGNOSIS — I8500 Esophageal varices without bleeding: Secondary | ICD-10-CM

## 2024-06-11 DIAGNOSIS — K7290 Hepatic failure, unspecified without coma: Secondary | ICD-10-CM | POA: Diagnosis present

## 2024-06-11 DIAGNOSIS — I4821 Permanent atrial fibrillation: Secondary | ICD-10-CM | POA: Diagnosis present

## 2024-06-11 DIAGNOSIS — Z6841 Body Mass Index (BMI) 40.0 and over, adult: Secondary | ICD-10-CM | POA: Diagnosis not present

## 2024-06-11 DIAGNOSIS — K7581 Nonalcoholic steatohepatitis (NASH): Secondary | ICD-10-CM | POA: Diagnosis present

## 2024-06-11 DIAGNOSIS — N179 Acute kidney failure, unspecified: Secondary | ICD-10-CM | POA: Diagnosis present

## 2024-06-11 DIAGNOSIS — D62 Acute posthemorrhagic anemia: Secondary | ICD-10-CM | POA: Diagnosis present

## 2024-06-11 DIAGNOSIS — D61818 Other pancytopenia: Secondary | ICD-10-CM | POA: Diagnosis present

## 2024-06-11 DIAGNOSIS — G4733 Obstructive sleep apnea (adult) (pediatric): Secondary | ICD-10-CM | POA: Diagnosis present

## 2024-06-11 DIAGNOSIS — N1832 Chronic kidney disease, stage 3b: Secondary | ICD-10-CM | POA: Diagnosis present

## 2024-06-11 DIAGNOSIS — I872 Venous insufficiency (chronic) (peripheral): Secondary | ICD-10-CM | POA: Diagnosis not present

## 2024-06-11 DIAGNOSIS — Z794 Long term (current) use of insulin: Secondary | ICD-10-CM | POA: Diagnosis not present

## 2024-06-11 DIAGNOSIS — K7460 Unspecified cirrhosis of liver: Secondary | ICD-10-CM | POA: Diagnosis present

## 2024-06-11 DIAGNOSIS — R161 Splenomegaly, not elsewhere classified: Secondary | ICD-10-CM | POA: Diagnosis present

## 2024-06-11 DIAGNOSIS — I8510 Secondary esophageal varices without bleeding: Secondary | ICD-10-CM | POA: Diagnosis present

## 2024-06-11 DIAGNOSIS — I350 Nonrheumatic aortic (valve) stenosis: Secondary | ICD-10-CM | POA: Diagnosis present

## 2024-06-11 DIAGNOSIS — I864 Gastric varices: Secondary | ICD-10-CM

## 2024-06-11 DIAGNOSIS — K922 Gastrointestinal hemorrhage, unspecified: Secondary | ICD-10-CM | POA: Diagnosis present

## 2024-06-11 DIAGNOSIS — E1122 Type 2 diabetes mellitus with diabetic chronic kidney disease: Secondary | ICD-10-CM | POA: Diagnosis present

## 2024-06-11 DIAGNOSIS — I9589 Other hypotension: Secondary | ICD-10-CM | POA: Diagnosis present

## 2024-06-11 DIAGNOSIS — K766 Portal hypertension: Secondary | ICD-10-CM | POA: Diagnosis present

## 2024-06-11 DIAGNOSIS — E785 Hyperlipidemia, unspecified: Secondary | ICD-10-CM | POA: Diagnosis present

## 2024-06-11 DIAGNOSIS — I5032 Chronic diastolic (congestive) heart failure: Secondary | ICD-10-CM | POA: Diagnosis present

## 2024-06-11 LAB — CBC
HCT: 24.6 % — ABNORMAL LOW (ref 36.0–46.0)
Hemoglobin: 7.5 g/dL — ABNORMAL LOW (ref 12.0–15.0)
MCH: 27.5 pg (ref 26.0–34.0)
MCHC: 30.5 g/dL (ref 30.0–36.0)
MCV: 90.1 fL (ref 80.0–100.0)
Platelets: 34 K/uL — ABNORMAL LOW (ref 150–400)
RBC: 2.73 MIL/uL — ABNORMAL LOW (ref 3.87–5.11)
RDW: 16.2 % — ABNORMAL HIGH (ref 11.5–15.5)
WBC: 3.7 K/uL — ABNORMAL LOW (ref 4.0–10.5)
nRBC: 0 % (ref 0.0–0.2)

## 2024-06-11 LAB — DIGOXIN LEVEL: Digoxin Level: 0.8 ng/mL (ref 0.8–2.0)

## 2024-06-11 LAB — COMPREHENSIVE METABOLIC PANEL WITH GFR
ALT: 17 U/L (ref 0–44)
AST: 22 U/L (ref 15–41)
Albumin: 3.1 g/dL — ABNORMAL LOW (ref 3.5–5.0)
Alkaline Phosphatase: 72 U/L (ref 38–126)
Anion gap: 10 (ref 5–15)
BUN: 42 mg/dL — ABNORMAL HIGH (ref 8–23)
CO2: 22 mmol/L (ref 22–32)
Calcium: 8.9 mg/dL (ref 8.9–10.3)
Chloride: 104 mmol/L (ref 98–111)
Creatinine, Ser: 1.86 mg/dL — ABNORMAL HIGH (ref 0.44–1.00)
GFR, Estimated: 29 mL/min — ABNORMAL LOW (ref >60)
Glucose, Bld: 165 mg/dL — ABNORMAL HIGH (ref 70–99)
Potassium: 3.8 mmol/L (ref 3.5–5.1)
Sodium: 136 mmol/L (ref 135–145)
Total Bilirubin: 2.7 mg/dL — ABNORMAL HIGH (ref 0.0–1.2)
Total Protein: 6.9 g/dL (ref 6.5–8.1)

## 2024-06-11 LAB — CBG MONITORING, ED
Glucose-Capillary: 248 mg/dL — ABNORMAL HIGH (ref 70–99)
Glucose-Capillary: 208 mg/dL — ABNORMAL HIGH (ref 70–99)
Glucose-Capillary: 157 mg/dL — ABNORMAL HIGH (ref 70–99)

## 2024-06-11 LAB — GLUCOSE, CAPILLARY
Glucose-Capillary: 128 mg/dL — ABNORMAL HIGH (ref 70–99)
Glucose-Capillary: 219 mg/dL — ABNORMAL HIGH (ref 70–99)

## 2024-06-11 LAB — PHOSPHORUS: Phosphorus: 3.9 mg/dL (ref 2.5–4.6)

## 2024-06-11 LAB — MAGNESIUM: Magnesium: 2 mg/dL (ref 1.7–2.4)

## 2024-06-11 MED ORDER — PROSIGHT PO TABS
1.0000 | ORAL_TABLET | Freq: Every day | ORAL | Status: DC
Start: 2024-06-11 — End: 2024-06-13
  Administered 2024-06-11 – 2024-06-13 (×3): 1 via ORAL
  Filled 2024-06-11 (×3): qty 1

## 2024-06-11 MED ORDER — INSULIN ASPART 100 UNIT/ML IJ SOLN
0.0000 [IU] | Freq: Three times a day (TID) | INTRAMUSCULAR | Status: DC
  Administered 2024-06-11: 1 [IU] via SUBCUTANEOUS
  Administered 2024-06-11: 3 [IU] via SUBCUTANEOUS
  Administered 2024-06-11 – 2024-06-12 (×2): 2 [IU] via SUBCUTANEOUS
  Administered 2024-06-12: 1 [IU] via SUBCUTANEOUS
  Administered 2024-06-13: 2 [IU] via SUBCUTANEOUS
  Administered 2024-06-13: 1 [IU] via SUBCUTANEOUS

## 2024-06-11 MED ORDER — ALBUMIN HUMAN 25 % IV SOLN: 25.0000 g | INTRAVENOUS | Status: DC

## 2024-06-11 MED ORDER — DIGOXIN 125 MCG PO TABS
125.0000 ug | ORAL_TABLET | Freq: Every day | ORAL | Status: DC
  Administered 2024-06-11 – 2024-06-13 (×3): 125 ug via ORAL
  Filled 2024-06-11 (×3): qty 1

## 2024-06-11 MED ORDER — PANTOPRAZOLE SODIUM 40 MG IV SOLR
40.0000 mg | Freq: Two times a day (BID) | INTRAVENOUS | Status: DC
  Administered 2024-06-11 – 2024-06-12 (×5): 40 mg via INTRAVENOUS
  Filled 2024-06-11 (×6): qty 10

## 2024-06-11 MED ORDER — LIDOCAINE 5 % EX PTCH
1.0000 | MEDICATED_PATCH | Freq: Every day | CUTANEOUS | Status: DC
  Administered 2024-06-11 – 2024-06-12 (×3): 1 via TRANSDERMAL
  Filled 2024-06-11 (×3): qty 1

## 2024-06-11 MED ORDER — LACTULOSE 10 GM/15ML PO SOLN
20.0000 g | Freq: Three times a day (TID) | ORAL | Status: DC
  Administered 2024-06-11 – 2024-06-13 (×7): 20 g via ORAL
  Filled 2024-06-11 (×7): qty 30

## 2024-06-11 MED ORDER — METOPROLOL TARTRATE 25 MG PO TABS
12.5000 mg | ORAL_TABLET | Freq: Once | ORAL | Status: AC
  Administered 2024-06-11: 12.5 mg via ORAL
  Filled 2024-06-11 (×2): qty 1

## 2024-06-11 MED ORDER — METOPROLOL TARTRATE 25 MG PO TABS
25.0000 mg | ORAL_TABLET | Freq: Two times a day (BID) | ORAL | Status: DC
  Administered 2024-06-11 – 2024-06-13 (×4): 25 mg via ORAL
  Filled 2024-06-11 (×4): qty 1

## 2024-06-11 MED ORDER — MIDODRINE HCL 5 MG PO TABS
15.0000 mg | ORAL_TABLET | Freq: Three times a day (TID) | ORAL | Status: DC
Start: 2024-06-11 — End: 2024-06-13
  Administered 2024-06-11 – 2024-06-13 (×8): 15 mg via ORAL
  Filled 2024-06-11 (×8): qty 3

## 2024-06-11 MED ORDER — MELATONIN 5 MG PO TABS
5.0000 mg | ORAL_TABLET | Freq: Every evening | ORAL | Status: DC | PRN
  Filled 2024-06-11: qty 1

## 2024-06-11 MED ORDER — INSULIN ASPART 100 UNIT/ML IJ SOLN
0.0000 [IU] | Freq: Every day | INTRAMUSCULAR | Status: DC
  Administered 2024-06-11 (×2): 2 [IU] via SUBCUTANEOUS

## 2024-06-11 MED ORDER — PROCHLORPERAZINE EDISYLATE 10 MG/2ML IJ SOLN
5.0000 mg | Freq: Four times a day (QID) | INTRAMUSCULAR | Status: DC | PRN
  Filled 2024-06-11: qty 2

## 2024-06-11 MED ORDER — ORAL CARE MOUTH RINSE: 15.0000 mL | OROMUCOSAL | Status: DC | PRN

## 2024-06-11 MED ORDER — POLYETHYLENE GLYCOL 3350 17 G PO PACK
17.0000 g | PACK | Freq: Every day | ORAL | Status: DC | PRN
  Filled 2024-06-11: qty 1

## 2024-06-11 MED ORDER — METHOCARBAMOL 500 MG PO TABS
750.0000 mg | ORAL_TABLET | Freq: Three times a day (TID) | ORAL | Status: DC | PRN
  Administered 2024-06-11 – 2024-06-12 (×3): 750 mg via ORAL
  Filled 2024-06-11 (×5): qty 2

## 2024-06-11 NOTE — Telephone Encounter (Signed)
 This is a CHF pt

## 2024-06-11 NOTE — H&P (Addendum)
 History and Physical  Hannah Khan FMW:[id number] DOB: 05-Dec-1955 DOA: 06/10/2024  Referring physician: Dr. Charlton TRH, hospitalist service. PCP: Regino Slater, MD  Outpatient Specialists: [facility], Dr. Burnette, cardiology. Patient coming from: Home through [facility] ED  Chief Complaint: Low hemoglobin  HPI: Hannah Khan is a 68 y.o. female with medical history significant for severe morbid obesity, NASH cirrhosis, esophageal and gastric varices, history of GI bleed, persistent A-fib not on anticoagulation, chronic HFpEF, OSA, CKD 3B, anemia of chronic disease, type 2 diabetes, who presents to the ER, referred to by outpatient provider due to low blood count.  Has been feeling generally weak.  Endorses dark stools.  No abdominal pain.  No vomiting.  No fever.  Admits to occasional chills.  In the ER, tachycardic and tachypneic.  Hemoglobin 5.6 with negative FOBT.  EDP discussed the case with GI who will see in consultation.  1 unit PRBCs ordered to be transfused.    ED Course: Temperature 97.9.  BP 117/66, pulse 124, respiration rate 18, O2 saturation 100% on room air.  Lab studies notable for creatinine 2.07 GFR 26.  WBC 3.1, hemoglobin 5.6, platelet count 28.  Review of Systems: Review of systems as noted in the HPI. All other systems reviewed and are negative.   Past Medical History:  Diagnosis Date   Anemia    hx of   Aortic stenosis 02/2023   Echo 03/07/23 LVEF 55-60%, mild to moderate AS   Arthritis    knees   CHF (congestive heart failure) (HCC)    chronic diastolic   Diabetes mellitus without complication (HCC)    type 2   Dyspnea    Dysrhythmia    Fibromyalgia    Gastric varices    large   H/O transfusion of platelets    Heart murmur    never has caused any problems   Hx of colonic polyps    s/p partial colectomy   Hyperlipidemia    Hypertension    Joint pain    in knees and back spasms   Left leg swelling    wear compression hose   Liver cirrhosis secondary to  NASH (nonalcoholic steatohepatitis) (HCC) dx nov 2014   had enlarged spleen also    Morbid obesity (HCC)    Persistent atrial fibrillation (HCC)    Pneumonia 10/2016   x 5   PONV (postoperative nausea and vomiting)    in past none recent   Portal hypertension (HCC)    Sleep apnea    uses cpap, setting varies between 14-20   Spleen enlarged    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    Past Surgical History:  Procedure Laterality Date   BREAST LUMPECTOMY WITH RADIOACTIVE SEED LOCALIZATION Right 08/29/2017   Procedure: RIGHT BREAST LUMPECTOMY WITH RADIOACTIVE SEEDS LOCALIZATION;  Surgeon: Vanderbilt Ned, MD;  Location: [facility] OR;  Service: General;  Laterality: Right;   CESAREAN SECTION  1989   CHOLECYSTECTOMY  20 yrs ago   COLECTOMY  2011   COLONOSCOPY     COLONOSCOPY N/A 09/05/2018   Procedure: COLONOSCOPY;  Surgeon: Burnette Fallow, MD;  Location: [facility];  Service: Endoscopy;  Laterality: N/A;   COLONOSCOPY WITH PROPOFOL  Bilateral 05/04/2022   Procedure: COLONOSCOPY WITH PROPOFOL ;  Surgeon: Burnette Fallow, MD;  Location: [facility];  Service: Gastroenterology;  Laterality: Bilateral;   DILATATION & CURETTAGE/HYSTEROSCOPY WITH MYOSURE N/A 05/06/2016   Procedure: DILATATION & CURETTAGE/HYSTEROSCOPY WITH MYOSURE WITH POLYPECTOMY;  Surgeon: Delon Prude, DO;  Location: [facility]S;  Service: Gynecology;  Laterality: N/A;   ESOPHAGOGASTRODUODENOSCOPY (EGD) WITH PROPOFOL  N/A 09/04/2013   Procedure: ESOPHAGOGASTRODUODENOSCOPY (EGD) WITH PROPOFOL ;  Surgeon: Elsie Cree, MD;  Location: [facility];  Service: Endoscopy;  Laterality: N/A;   ESOPHAGOGASTRODUODENOSCOPY (EGD) WITH PROPOFOL  Bilateral 05/04/2022   Procedure: ESOPHAGOGASTRODUODENOSCOPY (EGD) WITH PROPOFOL ;  Surgeon: Cree Elsie, MD;  Location: [facility];  Service: Gastroenterology;  Laterality: Bilateral;   IR EMBO ART  VEN HEMORR LYMPH EXTRAV  INC GUIDE ROADMAPPING  06/30/2023   IR INTRAVASCULAR ULTRASOUND NON CORONARY   12/23/2022   IR RADIOLOGIST EVAL & MGMT  09/21/2022   IR RADIOLOGIST EVAL & MGMT  10/06/2022   IR RADIOLOGIST EVAL & MGMT  11/02/2022   IR RADIOLOGIST EVAL & MGMT  01/31/2023   IR RADIOLOGIST EVAL & MGMT  05/31/2023   IR RADIOLOGIST EVAL & MGMT  08/21/2023   IR RADIOLOGIST EVAL & MGMT  12/25/2023   IR TIPS  12/23/2022   IR TIPS  06/30/2023   IR US  GUIDE VASC ACCESS LEFT  06/30/2023   IR US  GUIDE VASC ACCESS RIGHT  12/23/2022   IR US  GUIDE VASC ACCESS RIGHT  06/30/2023   IR US  GUIDE VASC ACCESS RIGHT  06/30/2023   KNEE ARTHROSCOPY  yrs ago   bilateral, one done x 1, one done twice   POLYPECTOMY  09/05/2018   Procedure: POLYPECTOMY;  Surgeon: Cree Elsie, MD;  Location: [facility];  Service: Endoscopy;;   RADIOLOGY WITH ANESTHESIA N/A 12/23/2022   Procedure: TIPS;  Surgeon: Jennefer Ester PARAS, MD;  Location: [facility] OR;  Service: Radiology;  Laterality: N/A;   RIGHT HEART CATH N/A 05/19/2023   Procedure: RIGHT HEART CATH;  Surgeon: Gardenia Led, DO;  Location: [facility];  Service: Cardiovascular;  Laterality: N/A;   TIPS PROCEDURE N/A 06/30/2023   Procedure: TRANS-JUGULAR INTRAHEPATIC PORTAL SHUNT (TIPS);  Surgeon: Jennefer Ester PARAS, MD;  Location[facility];  Service: Radiology;  Laterality: N/A;    Social History:  reports that she quit smoking about 49 years ago. Her smoking use included cigarettes. She started smoking about 52 years ago. She has a 3 pack-year smoking history. She has never used smokeless tobacco. She reports that she does not currently use alcohol . She reports that she does not use drugs.   Allergies  Allergen Reactions   Celebrex [Celecoxib] Other (See Comments)    speech slurred with celebrex   Shellfish Allergy Swelling, Rash and Other (See Comments)    TINGLING AND SWELLING OF LIPS. LARGE WHELPS, QUARTER-SIZE   Barium-Containing Compounds Other (See Comments)    TACHYCARDIA   Fentanyl  Other (See Comments)    Hallucinations    Nsaids Other (See Comments)     Contraindication due to CKD stage 3    Prednisone Other (See Comments)    TACHYCARDIA   Sglt2 Inhibitors Other (See Comments)    Ozempic , Severe gastroparesis    Statins Other (See Comments)    AGGRAVATED FIBROMYALGIA   Tylenol  [Acetaminophen ] Other (See Comments)    UNSPECIFIED SPECIFIC REACTION >> DUE TO PLATELETS   Ibuprofen  Other (See Comments)    UNSPECIFIED SPECIFIC REACTION >> DUE TO PLATELETS   Tramadol  Other (See Comments)    Hallucination    Zetia [Ezetimibe] Other (See Comments)    Headache    Family History  Problem Relation Age of Onset   Heart failure Mother    Cancer Mother    Hyperlipidemia Mother    Congestive Heart Failure Mother    Stroke Mother    Lung cancer Father  Cancer Father    Cancer Brother    Hyperlipidemia Brother    Stroke Other    Breast cancer Neg Hx       Prior to Admission medications   Medication Sig Start Date End Date Taking? Authorizing Provider  albuterol  (VENTOLIN  HFA) 108 (90 Base) MCG/ACT inhaler Inhale 1-2 puffs into the lungs every 6 (six) hours as needed for wheezing or shortness of breath.    [provider]  Continuous Glucose Sensor (FREESTYLE LIBRE 3 PLUS SENSOR) MISC Apply 1 each topically as directed. Place on arm as directed and change every 15 days. 01/12/24   [provider]  digoxin  (LANOXIN ) 0.125 MG tablet TAKE 1 TABLET BY MOUTH EVERY DAY 04/17/24   Sabharwal, Aditya, DO  lactulose  (CHRONULAC ) 10 GM/15ML solution Take 20 g by mouth 3 (three) times daily. 03/17/21   [provider]  methocarbamol  (ROBAXIN ) 750 MG tablet 1 tablet Orally every 8 hrs; Duration: 5 days 04/18/24   [provider]  metolazone  (ZAROXOLYN ) 2.5 MG tablet Take 1 tablet (2.5 mg total) by mouth as needed. 05/29/24   Glena Harlene HERO, FNP  metoprolol  tartrate (LOPRESSOR ) 50 MG tablet Take 1/2 tablet by mouth in the a.m and 1 tablet by mouth in the p.m 05/01/24   Swinyer, Rosaline HERO, NP  midodrine   (PROAMATINE ) 10 MG tablet Take 1.5 tablets (15 mg total) by mouth 3 (three) times daily. 05/29/24   Milford, Harlene HERO, FNP  Multiple Vitamins-Minerals (PRESERVISION AREDS 2) CAPS Take 1 capsule by mouth daily.    [provider]  [facility] VERIO test strip 1 each 3 (three) times daily. 04/13/20   [provider]  pantoprazole  (PROTONIX ) 40 MG tablet Take 40 mg by mouth 2 (two) times daily. 11/17/23   [provider]  potassium chloride  (MICRO-K ) 10 MEQ CR capsule Take 4 capsules (40 mEq total) by mouth 2 (two) times daily. Take an extra 4 tablets when taking Metolazone . 04/24/24   Jillian Buttery, MD  Propylene Glycol (SYSTANE BALANCE) 0.6 % SOLN Place 1 drop into both eyes 2 (two) times daily.    [provider]  spironolactone  (ALDACTONE ) 25 MG tablet TAKE 3 TABLETS BY MOUTH DAILY 03/05/24   Sabharwal, Aditya, DO  torsemide  (DEMADEX ) 20 MG tablet Take 4 tablets (80 mg total) by mouth in the morning and 2 tablets (40 mg total) in the evening 04/24/24   Jillian Buttery, MD  TRESIBA  FLEXTOUCH 100 UNIT/ML FlexTouch Pen Inject 32 Units into the skin daily. (TITRATE WHEN DIRECTED UP TO 50 UNITS) 01/22/24   [provider]  XIFAXAN  550 MG TABS tablet Take 550 mg by mouth 2 (two) times daily. 03/13/21   [provider]    Physical Exam: BP 110/65   Pulse (!) 132   Temp 98.1 F (36.7 C) (Oral)   Resp (!) 22   Ht 5' 7 (1.702 m)   Wt 127 kg   SpO2 100%   BMI 43.85 kg/m   General: 68 y.o. year-old female well developed well nourished in no acute distress.  Alert and oriented x3. Cardiovascular: Regular rate and rhythm with no rubs or gallops.  No thyromegaly or JVD noted.  No lower extremity edema. 2/4 pulses in all 4 extremities. Respiratory: Clear to auscultation with no wheezes or rales. Good inspiratory effort. Abdomen: Soft nontender nondistended with normal bowel sounds x4 quadrants. Muskuloskeletal: No cyanosis, clubbing or edema noted  bilaterally Neuro: CN II-XII intact, strength, sensation, reflexes Skin: No ulcerative lesions noted or  rashes Psychiatry: Judgement and insight appear normal. Mood is appropriate for condition and setting          Labs on Admission:  Basic Metabolic Panel: Recent Labs  Lab 06/10/24 1830  NA 136  K 4.4  CL 103  CO2 23  GLUCOSE 196*  BUN 42*  CREATININE 2.07*  CALCIUM  9.1   Liver Function Tests: No results for input(s): AST, ALT, ALKPHOS, BILITOT, PROT, ALBUMIN  in the last 168 hours. No results for input(s): LIPASE, AMYLASE in the last 168 hours. No results for input(s): AMMONIA in the last 168 hours. CBC: Recent Labs  Lab 06/10/24 1830  WBC 3.1*  HGB 5.6*  HCT 18.7*  MCV 88.2  PLT 28*   Cardiac Enzymes: No results for input(s): CKTOTAL, CKMB, CKMBINDEX, TROPONINI in the last 168 hours.  BNP (last 3 results) Recent Labs    08/09/23 1024 08/31/23 1112 12/06/23 1532  BNP 194.9* 183.9* 249.9*    ProBNP (last 3 results) Recent Labs    02/02/24 0900  PROBNP 1,655.0*    CBG: No results for input(s): GLUCAP in the last 168 hours.  Radiological Exams on Admission: No results found.  EKG: I independently viewed the EKG done and my findings are as followed: A-fib rate of 109.  Nonspecific ST-T changes with QTc 428.  Assessment/Plan Present on Admission:  Symptomatic anemia  Principal Problem:   Symptomatic anemia  Symptomatic anemia Presented with hemoglobin of 5.6 with negative FOBT. 1 unit PRBCs ordered to be transfused GI consulted by EDP IV PPI twice daily until seen by GI. Repeat CBC in the morning  Persistent atrial fibrillation with RVR, not anticoagulated Resume home beta-blocker Closely monitor on telemetry  AKI on CKD 3B, suspect prerenal in the setting of poor oral intake. Baseline creatinine appears to be 1.5 with GFR of 35 Presented with creatinine of 2.07 with GFR of 26 Avoid nephrotoxic agents,  dehydration, and hypotension. Monitor urine output Repeat BMP in the morning.  NASH cirrhosis with esophageal and gastric varices IV PPI twice daily Resume home regimen Rest of management per GI.  Chronic hypertension Resume home midodrine  Maintain MAP greater than 65. Closely monitor vital signs.  Pancytopenia suspect related to liver failure Monitor for now  Severe morbid obesity BMI 43 Recommend weight loss outpatient regular physical activity and healthy dieting.   Critical care time: 55 minutes.   DVT prophylaxis: SCDs.  Code Status: Full code.  Family Communication: None at bedside.  Disposition Plan: Admitted to progressive care unit.  Consults called: [facility] consulted by EDP.  Admission status: Inpatient status.   Status is: Inpatient The patient requires at least 2 midnights for further evaluation and treatment of present condition.   Terry LOISE Hurst MD [facility] Pager [phone]  If 7PM-7AM, please contact night-coverage [email or web address] Password [email or web address]  06/11/2024, 12:31 AM

## 2024-06-11 NOTE — Progress Notes (Signed)
 Courtesy visit- No billing.  Patient is seen and examined today morning. She is admitted for symptomatic anemia. Got 2 units of blood. Denies rectal bleeding, dark stools. Feels weak, did not get out of bed. Spouse at bedside.  Plan to continue to monitor H/H, transfuse for Hb <7. GI consult appreciated, no EGD plan for now as she does not have frank GI bleeding. Sh eis at risk for precedures given low platelets. Resumed diet. Discussed with patient, spouse at bedside. They understand and agree.

## 2024-06-11 NOTE — Consult Note (Signed)
 [facility] Gastroenterology Consult  Referring Provider: No ref. provider found Primary Care Physician:  Regino Slater, MD Primary Gastroenterologist: [facility] - Dr. Burnette  Reason for Consultation: Anemia, blood per rectum  SUBJECTIVE:   HPI: Hannah Khan is a 68 y.o. female known to GI service, history of cirrhosis secondary to metabolic associated liver disease decompensated by esophageal varices and gastric varix. She is status post BRTO on 06/30/23 and attempted, unsuccessful TIPS complicated by hemoperitoneum. Medical history also significant for heart failure with preserved ejection fraction and atrial fibrillation (no anticoagulant use), diabetes mellitus, chronic kidney disease.   She has followed with [facility], Dr. Myrna, on 02/21/24 and was recommended to consider repeat EGD to monitor esophageal varices as TIPS was previously unsuccessful.   She had outpatient blood work completed yesterday, Hgb < 7 and recommended to present to hospital. Labs on presentation showed Hgb 5.6, PLT 28, WBC 3.1.   Today, she noted that she has been experiencing chronic, significant fatigue, unchanged. She has chronic shortness of breath. She is on diuretic therapy, has improved lower extremity edema, though experiences cramping discomfort in her feet she believes related to diuretic therapy. She denied abdominal pain, nausea, vomiting. No fevers. Has some chills. No chest pain. She has liquid and semi-formed bowel movements with use of lactulose . She is also taking rifaximin  and torsemide . She noted having some scant blood per rectum over recent few days with wiping. Fecal occult blood testing in ER was negative. She has appointment with [facility], Dr. Burnette, scheduled for tomorrow to discuss next steps in her care including possible EGD. She has follow up with [facility] in January 2026.   EGD/COL 05/04/2022 for follow up of esophageal varices and personal history of colon polyps (Dr. Burnette) showed  grade I varices in lower third of esophagus (small in size), moderate portal hypertensive gastropathy in gastric fundus and body, isolated gastric varices with no bleeding in gastric cardia (medium in size), medium sized submucosal nodule in second portion of duodenum. Colonoscopy showed sigmoid and descending colon diverticulosis. Recommended repeat colonoscopy in 3-5 years.  Past Medical History:  Diagnosis Date   Anemia    hx of   Aortic stenosis 02/2023   Echo 03/07/23 LVEF 55-60%, mild to moderate AS   Arthritis    knees   CHF (congestive heart failure) (HCC)    chronic diastolic   Diabetes mellitus without complication (HCC)    type 2   Dyspnea    Dysrhythmia    Fibromyalgia    Gastric varices    large   H/O transfusion of platelets    Heart murmur    never has caused any problems   Hx of colonic polyps    s/p partial colectomy   Hyperlipidemia    Hypertension    Joint pain    in knees and back spasms   Left leg swelling    wear compression hose   Liver cirrhosis secondary to NASH (nonalcoholic steatohepatitis) (HCC) dx nov 2014   had enlarged spleen also    Morbid obesity (HCC)    Persistent atrial fibrillation (HCC)    Pneumonia 10/2016   x 5   PONV (postoperative nausea and vomiting)    in past none recent   Portal hypertension (HCC)    Sleep apnea    uses cpap, setting varies between 14-20   Spleen enlarged    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    Past Surgical History:  Procedure Laterality Date   BREAST  LUMPECTOMY WITH RADIOACTIVE SEED LOCALIZATION Right 08/29/2017   Procedure: RIGHT BREAST LUMPECTOMY WITH RADIOACTIVE SEEDS LOCALIZATION;  Surgeon: Vanderbilt Ned, MD;  Location: [facility] OR;  Service: General;  Laterality: Right;   CESAREAN SECTION  1989   CHOLECYSTECTOMY  20 yrs ago   COLECTOMY  2011   COLONOSCOPY     COLONOSCOPY N/A 09/05/2018   Procedure: COLONOSCOPY;  Surgeon: Burnette Fallow, MD;  Location: [facility];  Service: Endoscopy;   Laterality: N/A;   COLONOSCOPY WITH PROPOFOL  Bilateral 05/04/2022   Procedure: COLONOSCOPY WITH PROPOFOL ;  Surgeon: Burnette Fallow, MD;  Location: [facility];  Service: Gastroenterology;  Laterality: Bilateral;   DILATATION & CURETTAGE/HYSTEROSCOPY WITH MYOSURE N/A 05/06/2016   Procedure: DILATATION & CURETTAGE/HYSTEROSCOPY WITH MYOSURE WITH POLYPECTOMY;  Surgeon: Delon Prude, DO;  Location: [facility];  Service: Gynecology;  Laterality: N/A;   ESOPHAGOGASTRODUODENOSCOPY (EGD) WITH PROPOFOL  N/A 09/04/2013   Procedure: ESOPHAGOGASTRODUODENOSCOPY (EGD) WITH PROPOFOL ;  Surgeon: Fallow Burnette, MD;  Location: [facility];  Service: Endoscopy;  Laterality: N/A;   ESOPHAGOGASTRODUODENOSCOPY (EGD) WITH PROPOFOL  Bilateral 05/04/2022   Procedure: ESOPHAGOGASTRODUODENOSCOPY (EGD) WITH PROPOFOL ;  Surgeon: Burnette Fallow, MD;  Location: [facility];  Service: Gastroenterology;  Laterality: Bilateral;   IR EMBO ART  VEN HEMORR LYMPH EXTRAV  INC GUIDE ROADMAPPING  06/30/2023   IR INTRAVASCULAR ULTRASOUND NON CORONARY  12/23/2022   IR RADIOLOGIST EVAL & MGMT  09/21/2022   IR RADIOLOGIST EVAL & MGMT  10/06/2022   IR RADIOLOGIST EVAL & MGMT  11/02/2022   IR RADIOLOGIST EVAL & MGMT  01/31/2023   IR RADIOLOGIST EVAL & MGMT  05/31/2023   IR RADIOLOGIST EVAL & MGMT  08/21/2023   IR RADIOLOGIST EVAL & MGMT  12/25/2023   IR TIPS  12/23/2022   IR TIPS  06/30/2023   IR US  GUIDE VASC ACCESS LEFT  06/30/2023   IR US  GUIDE VASC ACCESS RIGHT  12/23/2022   IR US  GUIDE VASC ACCESS RIGHT  06/30/2023   IR US  GUIDE VASC ACCESS RIGHT  06/30/2023   KNEE ARTHROSCOPY  yrs ago   bilateral, one done x 1, one done twice   POLYPECTOMY  09/05/2018   Procedure: POLYPECTOMY;  Surgeon: Burnette Fallow, MD;  Location: [facility];  Service: Endoscopy;;   RADIOLOGY WITH ANESTHESIA N/A 12/23/2022   Procedure: TIPS;  Surgeon: Jennefer Ester PARAS, MD;  Location: [facility];  Service: Radiology;  Laterality: N/A;   RIGHT HEART CATH N/A 05/19/2023   Procedure:  RIGHT HEART CATH;  Surgeon: Gardenia Led, DO;  Location: [facility];  Service: Cardiovascular;  Laterality: N/A;   TIPS PROCEDURE N/A 06/30/2023   Procedure: TRANS-JUGULAR INTRAHEPATIC PORTAL SHUNT (TIPS);  Surgeon: Jennefer Ester PARAS, MD;  Location: [facility] OR;  Service: Radiology;  Laterality: N/A;   Prior to Admission medications   Medication Sig Start Date End Date Taking? Authorizing Provider  albuterol  (VENTOLIN  HFA) 108 (90 Base) MCG/ACT inhaler Inhale 1-2 puffs into the lungs every 6 (six) hours as needed for wheezing or shortness of breath.   Yes [provider]  digoxin  (LANOXIN ) 0.125 MG tablet TAKE 1 TABLET BY MOUTH EVERY DAY 04/17/24  Yes Sabharwal, Aditya, DO  lactulose  (CHRONULAC ) 10 GM/15ML solution Take 20 g by mouth 3 (three) times daily. 03/17/21  Yes [provider]  methocarbamol  (ROBAXIN ) 750 MG tablet 1 tablet Orally every 8 hrs; Duration: 5 days 04/18/24  Yes [provider]  metolazone  (ZAROXOLYN ) 2.5 MG tablet Take 1 tablet (2.5 mg total) by mouth as needed. Patient taking differently: Take 2.5  mg by mouth as needed. Take only when there is a 3-5lb weight gain.  Take one tablet (2.43m) 30 mins prior to taking Torsemide  if needed. 05/29/24  Yes Milford, Harlene HERO, FNP  metoprolol  tartrate (LOPRESSOR ) 50 MG tablet Take 1/2 tablet by mouth in the a.m and 1 tablet by mouth in the p.m Patient taking differently: Take 25-50 mg by mouth in the morning and at bedtime. Take one-half tablet (25mg ) by mouth in the a.m and 1 tablet (50mg ) by mouth in the p.m 05/01/24  Yes Swinyer, Rosaline HERO, NP  midodrine  (PROAMATINE ) 10 MG tablet Take 1.5 tablets (15 mg total) by mouth 3 (three) times daily. 05/29/24  Yes Milford, Harlene HERO, FNP  Multiple Vitamins-Minerals (PRESERVISION AREDS 2) CAPS Take 1 capsule by mouth daily.   Yes [provider]  pantoprazole  (PROTONIX ) 40 MG tablet Take 40 mg by mouth 2 (two) times daily. 11/17/23  Yes [provider]   potassium chloride  (MICRO-K ) 10 MEQ CR capsule Take 4 capsules (40 mEq total) by mouth 2 (two) times daily. Take an extra 4 tablets when taking Metolazone . 04/24/24  Yes Adhikari, Ivonne, MD  Propylene Glycol (SYSTANE BALANCE) 0.6 % SOLN Place 1 drop into both eyes 2 (two) times daily.   Yes [provider]  spironolactone  (ALDACTONE ) 25 MG tablet TAKE 3 TABLETS BY MOUTH DAILY 03/05/24  Yes Sabharwal, Aditya, DO  torsemide  (DEMADEX ) 20 MG tablet Take 4 tablets (80 mg total) by mouth in the morning and 2 tablets (40 mg total) in the evening 04/24/24  Yes Adhikari, Amrit, MD  TRESIBA  FLEXTOUCH 100 UNIT/ML FlexTouch Pen Inject 36 Units into the skin daily. (TITRATE WHEN DIRECTED UP TO 50 UNITS) 01/22/24  Yes [provider]  XIFAXAN  550 MG TABS tablet Take 550 mg by mouth 2 (two) times daily. 03/13/21  Yes [provider]   Current Facility-Administered Medications  Medication Dose Route Frequency Provider Last Rate Last Admin   digoxin  (LANOXIN ) tablet 125 mcg  125 mcg Oral Daily Sreeram, Concepcion, MD       insulin  aspart (novoLOG ) injection 0-5 Units  0-5 Units Subcutaneous QHS Shona Terry SAILOR, DO   2 Units at 06/11/24 0128   insulin  aspart (novoLOG ) injection 0-9 Units  0-9 Units Subcutaneous TID WC Shona Terry N, DO   3 Units at 06/11/24 9147   lactulose  (CHRONULAC ) 10 GM/15ML solution 20 g  20 g Oral TID Shona Terry SAILOR, DO       lidocaine  (LIDODERM ) 5 % 1 patch  1 patch Transdermal QHS Shona Terry N, DO   1 patch at 06/11/24 0118   melatonin tablet 5 mg  5 mg Oral QHS PRN Shona Terry SAILOR, DO       methocarbamol  (ROBAXIN ) tablet 750 mg  750 mg Oral Q8H PRN Darci Concepcion, MD       midodrine  (PROAMATINE ) tablet 15 mg  15 mg Oral TID WC Shona Terry N, DO   15 mg at 06/11/24 9148   multivitamin (PROSIGHT) tablet 1 tablet  1 tablet Oral Daily Sreeram, Narendranath, MD       pantoprazole  (PROTONIX ) injection 40 mg  40 mg Intravenous BID Hall, Carole N, DO   40 mg at  06/11/24 0119   polyethylene glycol (MIRALAX  / GLYCOLAX ) packet 17 g  17 g Oral Daily PRN Shona Terry SAILOR, DO       prochlorperazine  (COMPAZINE ) injection 5 mg  5 mg Intravenous Q6H PRN Shona Terry SAILOR, DO  Current Outpatient Medications  Medication Sig Dispense Refill   albuterol  (VENTOLIN  HFA) 108 (90 Base) MCG/ACT inhaler Inhale 1-2 puffs into the lungs every 6 (six) hours as needed for wheezing or shortness of breath.     digoxin  (LANOXIN ) 0.125 MG tablet TAKE 1 TABLET BY MOUTH EVERY DAY 90 tablet 4   lactulose  (CHRONULAC ) 10 GM/15ML solution Take 20 g by mouth 3 (three) times daily.     methocarbamol  (ROBAXIN ) 750 MG tablet 1 tablet Orally every 8 hrs; Duration: 5 days     metolazone  (ZAROXOLYN ) 2.5 MG tablet Take 1 tablet (2.5 mg total) by mouth as needed. (Patient taking differently: Take 2.5 mg by mouth as needed. Take only when there is a 3-5lb weight gain.  Take one tablet (2.74m) 30 mins prior to taking Torsemide  if needed.) 3 tablet 1   metoprolol  tartrate (LOPRESSOR ) 50 MG tablet Take 1/2 tablet by mouth in the a.m and 1 tablet by mouth in the p.m (Patient taking differently: Take 25-50 mg by mouth in the morning and at bedtime. Take one-half tablet (25mg ) by mouth in the a.m and 1 tablet (50mg ) by mouth in the p.m)     midodrine  (PROAMATINE ) 10 MG tablet Take 1.5 tablets (15 mg total) by mouth 3 (three) times daily. 135 tablet 11   Multiple Vitamins-Minerals (PRESERVISION AREDS 2) CAPS Take 1 capsule by mouth daily.     pantoprazole  (PROTONIX ) 40 MG tablet Take 40 mg by mouth 2 (two) times daily.     potassium chloride  (MICRO-K ) 10 MEQ CR capsule Take 4 capsules (40 mEq total) by mouth 2 (two) times daily. Take an extra 4 tablets when taking Metolazone .     Propylene Glycol (SYSTANE BALANCE) 0.6 % SOLN Place 1 drop into both eyes 2 (two) times daily.     spironolactone  (ALDACTONE ) 25 MG tablet TAKE 3 TABLETS BY MOUTH DAILY 270 tablet 1   torsemide  (DEMADEX ) 20 MG tablet Take 4  tablets (80 mg total) by mouth in the morning and 2 tablets (40 mg total) in the evening     TRESIBA  FLEXTOUCH 100 UNIT/ML FlexTouch Pen Inject 36 Units into the skin daily. (TITRATE WHEN DIRECTED UP TO 50 UNITS)     XIFAXAN  550 MG TABS tablet Take 550 mg by mouth 2 (two) times daily.     Allergies as of 06/10/2024 - Review Complete 06/10/2024  Allergen Reaction Noted   Celebrex [celecoxib] Other (See Comments) 12/08/2015   Shellfish allergy Swelling, Rash, and Other (See Comments) 08/02/2014   Barium-containing compounds Other (See Comments) 06/06/2017   Fentanyl  Other (See Comments) 04/26/2018   Nsaids Other (See Comments) 04/19/2024   Prednisone Other (See Comments) 12/08/2015   Sglt2 inhibitors Other (See Comments) 03/07/2023   Statins Other (See Comments) 02/03/2011   Tylenol  [acetaminophen ] Other (See Comments) 05/02/2016   Ibuprofen  Other (See Comments) 05/02/2016   Tramadol  Other (See Comments) 09/07/2018   Zetia [ezetimibe] Other (See Comments) 03/29/2021   Family History  Problem Relation Age of Onset   Heart failure Mother    Cancer Mother    Hyperlipidemia Mother    Congestive Heart Failure Mother    Stroke Mother    Lung cancer Father    Cancer Father    Cancer Brother    Hyperlipidemia Brother    Stroke Other    Breast cancer Neg Hx    Social History   Socioeconomic History   Marital status: Married    Spouse name: Not on file   Number of  children: Not on file   Years of education: Not on file   Highest education level: Not on file  Occupational History   Occupation: Scientist, product/process development  Tobacco Use   Smoking status: Former    Current packs/day: 0.00    Average packs/day: 1 pack/day for 3.0 years (3.0 ttl pk-yrs)    Types: Cigarettes    Start date: 09/20/1971    Quit date: 09/19/1974    Years since quitting: 49.7   Smokeless tobacco: Never  Vaping Use   Vaping status: Never Used  Substance and Sexual Activity   Alcohol  use: Not Currently    Comment: occ    Drug use: No   Sexual activity: Not on file  Other Topics Concern   Not on file  Social History Narrative   Lives in [place] with her husband.  Instructor for early childhood development.     Social Drivers of Corporate investment banker Strain: Not on file  Food Insecurity: No Food Insecurity (04/21/2024)   Hunger Vital Sign    Worried About Running Out of Food in the Last Year: Never true    Ran Out of Food in the Last Year: Never true  Transportation Needs: No Transportation Needs (04/21/2024)   PRAPARE - Administrator, Civil Service (Medical): No    Lack of Transportation (Non-Medical): No  Physical Activity: Not on file  Stress: Not on file  Social Connections: Socially Integrated (04/19/2024)   Social Connection and Isolation Panel    Frequency of Communication with Friends and Family: More than three times a week    Frequency of Social Gatherings with Friends and Family: More than three times a week    Attends Religious Services: 1 to 4 times per year    Active Member of [employer] or Organizations: No    Attends Engineer, structural: 1 to 4 times per year    Marital Status: Married  Catering manager Violence: Not At Risk (04/21/2024)   Humiliation, Afraid, Rape, and Kick questionnaire    Fear of Current or Ex-Partner: No    Emotionally Abused: No    Physically Abused: No    Sexually Abused: No   Review of Systems:  Review of Systems  Constitutional:  Positive for chills and malaise/fatigue. Negative for fever.  Respiratory:  Positive for shortness of breath.   Cardiovascular:  Negative for chest pain.  Gastrointestinal:  Negative for abdominal pain, blood in stool, constipation, diarrhea, nausea and vomiting.    OBJECTIVE:   Temp:  [97.6 F (36.4 C)-98.1 F (36.7 C)] 97.8 F (36.6 C) (09/23 0743) Pulse Rate:  [57-132] 107 (09/23 0743) Resp:  [14-25] 20 (09/23 0743) BP: (98-132)/(42-102) 122/78 (09/23 0743) SpO2:  [96 %-100 %] 100 % (09/23  0730) Weight:  [872 kg] 127 kg (09/22 1751)   Physical Exam Constitutional:      General: She is not in acute distress.    Appearance: She is not ill-appearing, toxic-appearing or diaphoretic.  Cardiovascular:     Rate and Rhythm: Tachycardia present. Rhythm irregular.  Pulmonary:     Effort: No respiratory distress.     Breath sounds: Normal breath sounds.     Comments: No supplemental oxygen in place.  Abdominal:     General: Bowel sounds are normal. There is no distension.     Palpations: Abdomen is soft.     Tenderness: There is no abdominal tenderness. There is no guarding.  Neurological:     Mental Status: She  is alert.     Labs: Recent Labs    06/10/24 1830  WBC 3.1*  HGB 5.6*  HCT 18.7*  PLT 28*   BMET Recent Labs    06/10/24 1830  NA 136  K 4.4  CL 103  CO2 23  GLUCOSE 196*  BUN 42*  CREATININE 2.07*  CALCIUM  9.1   LFT No results for input(s): PROT, ALBUMIN , AST, ALT, ALKPHOS, BILITOT, BILIDIR, IBILI in the last 72 hours. PT/INR Recent Labs    06/10/24 1831  LABPROT 16.7*  INR 1.3*   Diagnostic imaging: No results found.  IMPRESSION: Symptomatic anemia, pancytopenia secondary to below, FOBT (-) Cirrhosis secondary to metabolic associated liver disease decompensated by esophageal and gastric varix, trace ascites on previous imaging  -BRTO completed 06/30/2023, TIPS also attempted though unsuccessful and complicated by hemoperitoneum  -Follows with [facility], pending repeat evaluation in January 2026 Portal hypertensive gastropathy  Atrial fibrillation, no anticoagulant use Heart failure with preserved ejection fraction  Hypertension Diabetes mellitus Obstructive sleep apnea  PLAN: -Likely low grade GI bleeding from portal hypertensive gastropathy, no signs of active/frank GI bleeding  -Recommend iron supplementation and continue outpatient beta blocker therapy (metoprolol )  -Trend H/H, transfuse to keep Hgb > 7  -No  plan for EGD at current time as no frank GI bleeding, also not acceptable candidate for EGD at this time given profound thrombocytopenia and anemia -Would benefit from EGD in outpatient setting to monitor esophageal varices and gastric varices, this can be discussed with Dr. Burnette  -Manuelita for diet from GI perspective  -[facility] will sign off and be available should any questions arise   LOS: 0 days   Estefana Keas, [facility] Gastroenterology

## 2024-06-11 NOTE — ED Notes (Signed)
 Called and Placed PT on monitor with CCMD

## 2024-06-12 ENCOUNTER — Other Ambulatory Visit (HOSPITAL_COMMUNITY)

## 2024-06-12 DIAGNOSIS — D61818 Other pancytopenia: Secondary | ICD-10-CM | POA: Diagnosis not present

## 2024-06-12 DIAGNOSIS — K766 Portal hypertension: Secondary | ICD-10-CM

## 2024-06-12 DIAGNOSIS — E66813 Obesity, class 3: Secondary | ICD-10-CM

## 2024-06-12 DIAGNOSIS — K7581 Nonalcoholic steatohepatitis (NASH): Secondary | ICD-10-CM | POA: Diagnosis not present

## 2024-06-12 DIAGNOSIS — D649 Anemia, unspecified: Secondary | ICD-10-CM | POA: Diagnosis not present

## 2024-06-12 DIAGNOSIS — I4819 Other persistent atrial fibrillation: Secondary | ICD-10-CM

## 2024-06-12 LAB — GLUCOSE, CAPILLARY
Glucose-Capillary: 128 mg/dL — ABNORMAL HIGH (ref 70–99)
Glucose-Capillary: 174 mg/dL — ABNORMAL HIGH (ref 70–99)
Glucose-Capillary: 143 mg/dL — ABNORMAL HIGH (ref 70–99)
Glucose-Capillary: 142 mg/dL — ABNORMAL HIGH (ref 70–99)

## 2024-06-12 LAB — CBC
HCT: 22.7 % — ABNORMAL LOW (ref 36.0–46.0)
Hemoglobin: 6.9 g/dL — CL (ref 12.0–15.0)
MCH: 27.2 pg (ref 26.0–34.0)
MCHC: 30.4 g/dL (ref 30.0–36.0)
MCV: 89.4 fL (ref 80.0–100.0)
Platelets: 25 K/uL — CL (ref 150–400)
RBC: 2.54 MIL/uL — ABNORMAL LOW (ref 3.87–5.11)
RDW: 16.2 % — ABNORMAL HIGH (ref 11.5–15.5)
WBC: 2.7 K/uL — ABNORMAL LOW (ref 4.0–10.5)
nRBC: 0 % (ref 0.0–0.2)

## 2024-06-12 LAB — BASIC METABOLIC PANEL WITH GFR
Anion gap: 11 (ref 5–15)
BUN: 35 mg/dL — ABNORMAL HIGH (ref 8–23)
CO2: 23 mmol/L (ref 22–32)
Calcium: 9 mg/dL (ref 8.9–10.3)
Chloride: 103 mmol/L (ref 98–111)
Creatinine, Ser: 1.64 mg/dL — ABNORMAL HIGH (ref 0.44–1.00)
GFR, Estimated: 34 mL/min — ABNORMAL LOW (ref >60)
Glucose, Bld: 131 mg/dL — ABNORMAL HIGH (ref 70–99)
Potassium: 3.8 mmol/L (ref 3.5–5.1)
Sodium: 137 mmol/L (ref 135–145)

## 2024-06-12 LAB — HEMOGLOBIN AND HEMATOCRIT, BLOOD
HCT: 26 % — ABNORMAL LOW (ref 36.0–46.0)
Hemoglobin: 8.1 g/dL — ABNORMAL LOW (ref 12.0–15.0)

## 2024-06-12 LAB — PREPARE RBC (CROSSMATCH)

## 2024-06-12 MED ORDER — SODIUM CHLORIDE 0.9% IV SOLUTION
Freq: Once | INTRAVENOUS | Status: AC
Start: 2024-06-12 — End: 2024-06-12

## 2024-06-12 NOTE — Progress Notes (Signed)
 Progress Note   Patient: Hannah Khan FMW:[id number] DOB: 1956-06-15 DOA: 06/10/2024     1 DOS: the patient was seen and examined on 06/12/2024   Brief hospital course: SHAWNTAY PREST is a 68 y.o. female with medical history significant for severe morbid obesity, NASH cirrhosis, esophageal and gastric varices, history of GI bleed, persistent A-fib not on anticoagulation, chronic HFpEF, OSA, CKD 3B, anemia of chronic disease, type 2 diabetes, who presents to the ER, referred to by outpatient provider due to low blood count.  Has been feeling generally weak.  Endorses dark stools.   Hb noted to be 5.6, got 2 unit PRBC transfused. Admitted with GI evaluation. GI did not recommend EGD as no frank bleeding evident.  Assessment and Plan: Symptomatic anemia- Low Hb in the setting of esophageal/ gastric varices, CKD, liver failure. She got 2 units of PRBC yesterday. Today Hb 6.9 another unit ordered. GI did not recommend urgent EGD at this time, outpatient follow up. Continue IV PPI.  NASH Cirrhosis with mild ascitis Portal hypertensive gastropathy- Esophageal and gastric varices BRTO completed 06/30/2023, TIPS also attempted though unsuccessful and complicated by hemoperitoneum. She follows with [facility], pending repeat evaluation in January 2026.  Pancytopenia- Due to liver failure. Platelets 25 stable. Monitor daily CBC.  Persistent atrial fibrillation with RVR, not anticoagulated Resume home beta-blocker. Not on anticoagulation due to risk of bleeding. Closely monitor on telemetry   AKI on CKD 3B,  Suspect prerenal in the setting of anemia, poor oral intake. Baseline creatinine 1.6 presented with 2.07. Avoid nephrotoxic agents, dehydration, and hypotension. Monitor urine output. Today Creatinine improved. Repeat BMP daily.   NASH cirrhosis with esophageal and gastric varices IV PPI twice daily Resume home regimen Rest of management per GI.   Chronic  hypotension Resumed home midodrine  Maintain MAP greater than 65. Closely monitor vital signs.   Severe morbid obesity BMI 45.16 Recommend weight loss outpatient regular physical activity and healthy dieting.     Out of bed to chair. Incentive spirometry. Nursing supportive care. Fall, aspiration precautions. Diet:  Diet Orders (From admission, onward)     Start     Ordered   06/11/24 0032  Diet heart healthy/carb modified Room service appropriate? Yes; Fluid consistency: Thin  Diet effective now       Question Answer Comment  Diet-HS Snack? Nothing   Room service appropriate? Yes   Fluid consistency: Thin      06/11/24 0032           DVT prophylaxis: SCDs Start: 06/11/24 0021  Level of care: Progressive   Code Status: Full Code  Subjective: Patient is seen and examined today morning. She is sitting in chair. She is weak but feels better than yesterday. Denies dark or bright red stools. Did get out of bed. No family at bedside.  Physical Exam: Vitals:   06/12/24 0602 06/12/24 0616 06/12/24 0844 06/12/24 0922  BP: 115/67  128/67 108/61  Pulse: (!) 101  99 92  Resp: 18  18 20   Temp: 97.8 F (36.6 C)  97.7 F (36.5 C) (!) 97.5 F (36.4 C)  TempSrc: Oral  Oral Oral  SpO2: 95%  99%   Weight:  130.8 kg    Height:        General - Elderly morbidly obese ill Caucasian female, no apparent distress HEENT - PERRLA, EOMI, atraumatic head, non tender sinuses. Lung - distant breath sounds, rales, no rhonchi, wheezes. Heart - S1, S2 heard, no murmurs, rubs, 1+  pedal edema. Abdomen - Soft, non tender, obese, bowel sounds good Neuro - Alert, awake and oriented x 3, non focal exam. Skin - Warm and dry.  Data Reviewed:      Latest Ref Rng & Units 06/12/2024   12:07 PM 06/12/2024    2:18 AM 06/11/2024    5:00 AM  CBC  WBC 4.0 - 10.5 K/uL  2.7  3.7   Hemoglobin 12.0 - 15.0 g/dL 8.1  6.9  7.5   Hematocrit 36.0 - 46.0 % 26.0  22.7  24.6   Platelets 150 - 400 K/uL  25   34       Latest Ref Rng & Units 06/12/2024    2:18 AM 06/11/2024    5:00 AM 06/10/2024    6:30 PM  BMP  Glucose 70 - 99 mg/dL 868  834  803   BUN 8 - 23 mg/dL 35  42  42   Creatinine 0.44 - 1.00 mg/dL 8.35  8.13  7.92   Sodium 135 - 145 mmol/L 137  136  136   Potassium 3.5 - 5.1 mmol/L 3.8  3.8  4.4   Chloride 98 - 111 mmol/L 103  104  103   CO2 22 - 32 mmol/L 23  22  23    Calcium  8.9 - 10.3 mg/dL 9.0  8.9  9.1    No results found.  Family Communication: Discussed with patient, understand and agree. All questions answered.  Disposition: Status is: Inpatient Remains inpatient appropriate because: severe anemia, needing transfusions  Planned Discharge Destination: Home with Home Health     Time spent: 46 minutes  Author: Concepcion Riser, MD 06/12/2024 2:31 PM Secure chat 7am to 7pm For on call review [email or web address].

## 2024-06-12 NOTE — Progress Notes (Signed)
Hgb is 6.9. Plan to transfuse 1 unit RBCs.

## 2024-06-13 DIAGNOSIS — E113593 Type 2 diabetes mellitus with proliferative diabetic retinopathy without macular edema, bilateral: Secondary | ICD-10-CM

## 2024-06-13 DIAGNOSIS — I872 Venous insufficiency (chronic) (peripheral): Secondary | ICD-10-CM | POA: Diagnosis not present

## 2024-06-13 DIAGNOSIS — I8500 Esophageal varices without bleeding: Secondary | ICD-10-CM | POA: Diagnosis not present

## 2024-06-13 DIAGNOSIS — I10 Essential (primary) hypertension: Secondary | ICD-10-CM

## 2024-06-13 DIAGNOSIS — K7460 Unspecified cirrhosis of liver: Secondary | ICD-10-CM

## 2024-06-13 DIAGNOSIS — D649 Anemia, unspecified: Secondary | ICD-10-CM | POA: Diagnosis not present

## 2024-06-13 DIAGNOSIS — G4733 Obstructive sleep apnea (adult) (pediatric): Secondary | ICD-10-CM

## 2024-06-13 DIAGNOSIS — I4821 Permanent atrial fibrillation: Secondary | ICD-10-CM

## 2024-06-13 LAB — CBC
HCT: 27 % — ABNORMAL LOW (ref 36.0–46.0)
Hemoglobin: 8.3 g/dL — ABNORMAL LOW (ref 12.0–15.0)
MCH: 27.9 pg (ref 26.0–34.0)
MCHC: 30.7 g/dL (ref 30.0–36.0)
MCV: 90.9 fL (ref 80.0–100.0)
Platelets: 31 K/uL — ABNORMAL LOW (ref 150–400)
RBC: 2.97 MIL/uL — ABNORMAL LOW (ref 3.87–5.11)
RDW: 16.2 % — ABNORMAL HIGH (ref 11.5–15.5)
WBC: 4.1 K/uL (ref 4.0–10.5)
nRBC: 0 % (ref 0.0–0.2)

## 2024-06-13 LAB — GLUCOSE, CAPILLARY
Glucose-Capillary: 128 mg/dL — ABNORMAL HIGH (ref 70–99)
Glucose-Capillary: 159 mg/dL — ABNORMAL HIGH (ref 70–99)

## 2024-06-13 LAB — BASIC METABOLIC PANEL WITH GFR
Anion gap: 6 (ref 5–15)
BUN: 31 mg/dL — ABNORMAL HIGH (ref 8–23)
CO2: 22 mmol/L (ref 22–32)
Calcium: 8.9 mg/dL (ref 8.9–10.3)
Chloride: 107 mmol/L (ref 98–111)
Creatinine, Ser: 1.79 mg/dL — ABNORMAL HIGH (ref 0.44–1.00)
GFR, Estimated: 31 mL/min — ABNORMAL LOW (ref >60)
Glucose, Bld: 152 mg/dL — ABNORMAL HIGH (ref 70–99)
Potassium: 4.2 mmol/L (ref 3.5–5.1)
Sodium: 135 mmol/L (ref 135–145)

## 2024-06-13 MED ORDER — RIFAXIMIN 550 MG PO TABS
550.0000 mg | ORAL_TABLET | Freq: Two times a day (BID) | ORAL | Status: DC
  Administered 2024-06-13: 550 mg via ORAL
  Filled 2024-06-13: qty 1

## 2024-06-13 MED ORDER — METHOCARBAMOL 750 MG PO TABS: 750.0000 mg | ORAL_TABLET | Freq: Three times a day (TID) | ORAL | 0 refills | Status: AC | PRN

## 2024-06-13 MED ORDER — PANTOPRAZOLE SODIUM 40 MG PO TBEC
40.0000 mg | DELAYED_RELEASE_TABLET | Freq: Two times a day (BID) | ORAL | Status: DC
  Administered 2024-06-13: 40 mg via ORAL
  Filled 2024-06-13: qty 1

## 2024-06-13 NOTE — Discharge Summary (Incomplete)
 Physician Discharge Summary   Patient: Hannah Khan MRN: [id number] DOB: 03-09-1956  Admit date:     06/10/2024  Discharge date: {dischdate:26783}  Discharge Physician: Concepcion Riser   PCP: Regino Slater, MD   Recommendations at discharge:  {Tip this will not be part of the note when signed- Example include specific recommendations for outpatient follow-up, pending tests to follow-up on. (Optional):26781}  ***  Discharge Diagnoses: Principal Problem:   Symptomatic anemia  Resolved Problems:   * No resolved hospital problems. [facility] Course: No notes on file  Assessment and Plan: No notes have been filed under this hospital service. Service: Hospitalist     {Tip this will not be part of the note when signed Body mass index is 45.51 kg/m. , ,  (Optional):26781}  {(NOTE) Pain control PDMP Statment (Optional):26782} Consultants: *** Procedures performed: ***  Disposition: {Plan; Disposition:26390} Diet recommendation:  Discharge Diet Orders (From admission, onward)     Start     Ordered   06/13/24 0000  Diet - low sodium heart healthy        06/13/24 1118   06/13/24 0000  Diet Carb Modified        06/13/24 1118           {Diet_Plan:26776} DISCHARGE MEDICATION: Allergies as of 06/13/2024       Reactions   Celebrex [celecoxib] Other (See Comments)   speech slurred with celebrex   Shellfish Allergy Swelling, Rash, Other (See Comments)   TINGLING AND SWELLING OF LIPS. LARGE WHELPS, QUARTER-SIZE   Tomato Anaphylaxis   Pt cannot swallow due to blisters that form inside throat resulting in swelling.    Barium-containing Compounds Other (See Comments)   TACHYCARDIA   Fentanyl  Other (See Comments)   Hallucinations    Nsaids Other (See Comments)   Contraindication due to CKD stage 3    Prednisone Other (See Comments)   TACHYCARDIA   Sglt2 Inhibitors Other (See Comments)   Ozempic , Severe gastroparesis    Statins Other (See Comments)    AGGRAVATED FIBROMYALGIA   Tylenol  [acetaminophen ] Other (See Comments)   UNSPECIFIED SPECIFIC REACTION >> DUE TO PLATELETS   Ibuprofen  Other (See Comments)   UNSPECIFIED SPECIFIC REACTION >> DUE TO PLATELETS   Tramadol  Other (See Comments)   Hallucination   Zetia [ezetimibe] Other (See Comments)   Headache        Medication List     TAKE these medications    albuterol  108 (90 Base) MCG/ACT inhaler Commonly known as: VENTOLIN  HFA Inhale 1-2 puffs into the lungs every 6 (six) hours as needed for wheezing or shortness of breath.   digoxin  0.125 MG tablet Commonly known as: LANOXIN  TAKE 1 TABLET BY MOUTH EVERY DAY   lactulose  10 GM/15ML solution Commonly known as: CHRONULAC  Take 20 g by mouth 3 (three) times daily.   methocarbamol  750 MG tablet Commonly known as: ROBAXIN  Take 1 tablet (750 mg total) by mouth every 8 (eight) hours as needed for muscle spasms. What changed: See the new instructions.   metolazone  2.5 MG tablet Commonly known as: ZAROXOLYN  Take 1 tablet (2.5 mg total) by mouth as needed. What changed: additional instructions   metoprolol  tartrate 50 MG tablet Commonly known as: LOPRESSOR  Take 1/2 tablet by mouth in the a.m and 1 tablet by mouth in the p.m What changed:  how much to take how to take this when to take this additional instructions   midodrine  10 MG tablet Commonly known as: PROAMATINE  Take 1.5 tablets (15  mg total) by mouth 3 (three) times daily.   pantoprazole  40 MG tablet Commonly known as: PROTONIX  Take 40 mg by mouth 2 (two) times daily.   potassium chloride  10 MEQ CR capsule Commonly known as: MICRO-K  Take 4 capsules (40 mEq total) by mouth 2 (two) times daily. Take an extra 4 tablets when taking Metolazone .   PreserVision AREDS 2 Caps Take 1 capsule by mouth daily.   spironolactone  25 MG tablet Commonly known as: ALDACTONE  TAKE 3 TABLETS BY MOUTH DAILY   Systane Balance 0.6 % Soln Generic drug: Propylene  Glycol Place 1 drop into both eyes 2 (two) times daily.   torsemide  20 MG tablet Commonly known as: DEMADEX  Take 4 tablets (80 mg total) by mouth in the morning and 2 tablets (40 mg total) in the evening   Tresiba  FlexTouch 100 UNIT/ML FlexTouch Pen Generic drug: insulin  degludec Inject 36 Units into the skin daily. (TITRATE WHEN DIRECTED UP TO 50 UNITS)   Xifaxan  550 MG Tabs tablet Generic drug: rifaximin  Take 550 mg by mouth 2 (two) times daily.        Discharge Exam: Filed Weights   06/10/24 1751 06/12/24 0616 06/13/24 0554  Weight: 127 kg 130.8 kg 131.8 kg   ***  Condition at discharge: {DC Condition:26389}  The results of significant diagnostics from this hospitalization (including imaging, microbiology, ancillary and laboratory) are listed below for reference.   Imaging Studies: No results found.  Microbiology: Results for orders placed or performed in visit on 05/29/24  Blood culture (routine single)     Status: None   Collection Time: 05/29/24  1:24 PM   Specimen: Blood  Result Value Ref Range Status   MICRO NUMBER: [id number]  Final   SPECIMEN QUALITY: Suboptimal  Final   Source BLOOD 1  Final   STATUS: FINAL  Final   Result:   Final    No growth after 5 days Inspection of blood culture bottles indicates that an inadequate volume of blood may have been collected for the detection of sepsis.   COMMENT: Aerobic and anaerobic bottle received.  Final  Blood culture (routine single)     Status: None   Collection Time: 05/29/24  1:29 PM   Specimen: Blood  Result Value Ref Range Status   MICRO NUMBER: [id number]  Final   SPECIMEN QUALITY: Adequate  Final   Source BLOOD 2  Final   STATUS: FINAL  Final   Result: No growth after 5 days  Final   COMMENT: Aerobic and anaerobic bottle received.  Final    Labs: CBC: Recent Labs  Lab 06/10/24 1830 06/11/24 0500 06/12/24 0218 06/12/24 1207 06/13/24 0229  WBC 3.1* 3.7* 2.7*  --  4.1  HGB 5.6* 7.5* 6.9* 8.1*  8.3*  HCT 18.7* 24.6* 22.7* 26.0* 27.0*  MCV 88.2 90.1 89.4  --  90.9  PLT 28* 34* 25*  --  31*   Basic Metabolic Panel: Recent Labs  Lab 06/10/24 1830 06/11/24 0500 06/12/24 0218 06/13/24 0229  NA 136 136 137 135  K 4.4 3.8 3.8 4.2  CL 103 104 103 107  CO2 23 22 23 22   GLUCOSE 196* 165* 131* 152*  BUN 42* 42* 35* 31*  CREATININE 2.07* 1.86* 1.64* 1.79*  CALCIUM  9.1 8.9 9.0 8.9  MG  --  2.0  --   --   PHOS  --  3.9  --   --    Liver Function Tests: Recent Labs  Lab 06/11/24 0500  AST 22  ALT 17  ALKPHOS 72  BILITOT 2.7*  PROT 6.9  ALBUMIN  3.1*   CBG: Recent Labs  Lab 06/12/24 1121 06/12/24 1629 06/12/24 2127 06/13/24 0640 06/13/24 1137  GLUCAP 174* 143* 142* 128* 159*    Discharge time spent: {LESS THAN/GREATER THAN:26388} 30 minutes.  Signed: Concepcion Riser, MD [facility] 06/13/2024

## 2024-06-13 NOTE — Progress Notes (Signed)
   06/13/24 1151  TOC Brief Assessment  Insurance and Status Reviewed  Patient has primary care physician Yes  Home environment has been reviewed home w/ spouse  Prior level of function: independent  Prior/Current Home Services No current home services  Social Drivers of Health Review SDOH reviewed no interventions necessary  Readmission risk has been reviewed Yes  Transition of care needs no transition of care needs at this time    Pt stable for transition home today,  no HH or DME needs noted. Pt has PCP and will follow up as per AVS instructions.

## 2024-06-14 DIAGNOSIS — I8500 Esophageal varices without bleeding: Secondary | ICD-10-CM | POA: Insufficient documentation

## 2024-06-14 LAB — BPAM RBC
Blood Product Expiration Date: 202510182359
Blood Product Expiration Date: 202510212359
Blood Product Expiration Date: 202510222359
Blood Product Expiration Date: 202510272359
ISSUE DATE / TIME: 202509230205
ISSUE DATE / TIME: 202509230642
ISSUE DATE / TIME: 202509240530
Unit Type and Rh: 5100
Unit Type and Rh: 5100
Unit Type and Rh: 5100
Unit Type and Rh: 5100

## 2024-06-14 LAB — TYPE AND SCREEN
ABO/RH(D): O POS
Antibody Screen: POSITIVE
Unit division: 0
Unit division: 0
Unit division: 0
Unit division: 0

## 2024-06-18 DIAGNOSIS — L03116 Cellulitis of left lower limb: Secondary | ICD-10-CM | POA: Diagnosis not present

## 2024-06-18 DIAGNOSIS — J189 Pneumonia, unspecified organism: Secondary | ICD-10-CM | POA: Diagnosis not present

## 2024-06-18 DIAGNOSIS — R6520 Severe sepsis without septic shock: Secondary | ICD-10-CM | POA: Diagnosis not present

## 2024-06-18 DIAGNOSIS — I130 Hypertensive heart and chronic kidney disease with heart failure and stage 1 through stage 4 chronic kidney disease, or unspecified chronic kidney disease: Secondary | ICD-10-CM | POA: Diagnosis not present

## 2024-06-18 DIAGNOSIS — N1832 Chronic kidney disease, stage 3b: Secondary | ICD-10-CM | POA: Diagnosis not present

## 2024-06-18 DIAGNOSIS — I5032 Chronic diastolic (congestive) heart failure: Secondary | ICD-10-CM | POA: Diagnosis not present

## 2024-06-18 DIAGNOSIS — E1122 Type 2 diabetes mellitus with diabetic chronic kidney disease: Secondary | ICD-10-CM | POA: Diagnosis not present

## 2024-06-18 DIAGNOSIS — N179 Acute kidney failure, unspecified: Secondary | ICD-10-CM | POA: Diagnosis not present

## 2024-06-18 DIAGNOSIS — A411 Sepsis due to other specified staphylococcus: Secondary | ICD-10-CM | POA: Diagnosis not present

## 2024-06-21 DIAGNOSIS — A411 Sepsis due to other specified staphylococcus: Secondary | ICD-10-CM | POA: Diagnosis not present

## 2024-06-21 DIAGNOSIS — I5032 Chronic diastolic (congestive) heart failure: Secondary | ICD-10-CM | POA: Diagnosis not present

## 2024-06-21 DIAGNOSIS — R6520 Severe sepsis without septic shock: Secondary | ICD-10-CM | POA: Diagnosis not present

## 2024-06-21 DIAGNOSIS — J189 Pneumonia, unspecified organism: Secondary | ICD-10-CM | POA: Diagnosis not present

## 2024-06-21 DIAGNOSIS — L03116 Cellulitis of left lower limb: Secondary | ICD-10-CM | POA: Diagnosis not present

## 2024-06-21 DIAGNOSIS — N1832 Chronic kidney disease, stage 3b: Secondary | ICD-10-CM | POA: Diagnosis not present

## 2024-06-21 DIAGNOSIS — N179 Acute kidney failure, unspecified: Secondary | ICD-10-CM | POA: Diagnosis not present

## 2024-06-21 DIAGNOSIS — I130 Hypertensive heart and chronic kidney disease with heart failure and stage 1 through stage 4 chronic kidney disease, or unspecified chronic kidney disease: Secondary | ICD-10-CM | POA: Diagnosis not present

## 2024-06-21 DIAGNOSIS — E1122 Type 2 diabetes mellitus with diabetic chronic kidney disease: Secondary | ICD-10-CM | POA: Diagnosis not present

## 2024-06-25 DIAGNOSIS — D649 Anemia, unspecified: Secondary | ICD-10-CM | POA: Diagnosis not present

## 2024-06-25 DIAGNOSIS — K766 Portal hypertension: Secondary | ICD-10-CM | POA: Diagnosis not present

## 2024-06-25 DIAGNOSIS — D696 Thrombocytopenia, unspecified: Secondary | ICD-10-CM | POA: Diagnosis not present

## 2024-06-25 DIAGNOSIS — I959 Hypotension, unspecified: Secondary | ICD-10-CM | POA: Diagnosis not present

## 2024-06-25 DIAGNOSIS — K7460 Unspecified cirrhosis of liver: Secondary | ICD-10-CM | POA: Diagnosis not present

## 2024-06-25 DIAGNOSIS — Z23 Encounter for immunization: Secondary | ICD-10-CM | POA: Diagnosis not present

## 2024-06-25 DIAGNOSIS — I5032 Chronic diastolic (congestive) heart failure: Secondary | ICD-10-CM | POA: Diagnosis not present

## 2024-06-25 DIAGNOSIS — I4821 Permanent atrial fibrillation: Secondary | ICD-10-CM | POA: Diagnosis not present

## 2024-06-26 ENCOUNTER — Encounter (HOSPITAL_COMMUNITY): Payer: Self-pay | Admitting: Student

## 2024-06-26 DIAGNOSIS — I5032 Chronic diastolic (congestive) heart failure: Secondary | ICD-10-CM | POA: Diagnosis not present

## 2024-06-26 DIAGNOSIS — D631 Anemia in chronic kidney disease: Secondary | ICD-10-CM | POA: Diagnosis not present

## 2024-06-26 DIAGNOSIS — E1122 Type 2 diabetes mellitus with diabetic chronic kidney disease: Secondary | ICD-10-CM | POA: Diagnosis not present

## 2024-06-26 DIAGNOSIS — I2510 Atherosclerotic heart disease of native coronary artery without angina pectoris: Secondary | ICD-10-CM | POA: Diagnosis not present

## 2024-06-26 DIAGNOSIS — N179 Acute kidney failure, unspecified: Secondary | ICD-10-CM | POA: Diagnosis not present

## 2024-06-26 DIAGNOSIS — N1832 Chronic kidney disease, stage 3b: Secondary | ICD-10-CM | POA: Diagnosis not present

## 2024-06-26 DIAGNOSIS — I350 Nonrheumatic aortic (valve) stenosis: Secondary | ICD-10-CM | POA: Diagnosis not present

## 2024-06-26 DIAGNOSIS — I130 Hypertensive heart and chronic kidney disease with heart failure and stage 1 through stage 4 chronic kidney disease, or unspecified chronic kidney disease: Secondary | ICD-10-CM | POA: Diagnosis not present

## 2024-06-26 DIAGNOSIS — I4821 Permanent atrial fibrillation: Secondary | ICD-10-CM | POA: Diagnosis not present

## 2024-07-09 DIAGNOSIS — D649 Anemia, unspecified: Secondary | ICD-10-CM | POA: Diagnosis not present

## 2024-07-11 DIAGNOSIS — E1122 Type 2 diabetes mellitus with diabetic chronic kidney disease: Secondary | ICD-10-CM | POA: Diagnosis not present

## 2024-07-11 DIAGNOSIS — I350 Nonrheumatic aortic (valve) stenosis: Secondary | ICD-10-CM | POA: Diagnosis not present

## 2024-07-11 DIAGNOSIS — I2510 Atherosclerotic heart disease of native coronary artery without angina pectoris: Secondary | ICD-10-CM | POA: Diagnosis not present

## 2024-07-11 DIAGNOSIS — I130 Hypertensive heart and chronic kidney disease with heart failure and stage 1 through stage 4 chronic kidney disease, or unspecified chronic kidney disease: Secondary | ICD-10-CM | POA: Diagnosis not present

## 2024-07-11 DIAGNOSIS — D631 Anemia in chronic kidney disease: Secondary | ICD-10-CM | POA: Diagnosis not present

## 2024-07-11 DIAGNOSIS — I5032 Chronic diastolic (congestive) heart failure: Secondary | ICD-10-CM | POA: Diagnosis not present

## 2024-07-11 DIAGNOSIS — N1832 Chronic kidney disease, stage 3b: Secondary | ICD-10-CM | POA: Diagnosis not present

## 2024-07-11 DIAGNOSIS — N179 Acute kidney failure, unspecified: Secondary | ICD-10-CM | POA: Diagnosis not present

## 2024-07-11 DIAGNOSIS — I4821 Permanent atrial fibrillation: Secondary | ICD-10-CM | POA: Diagnosis not present

## 2024-07-15 DIAGNOSIS — G4733 Obstructive sleep apnea (adult) (pediatric): Secondary | ICD-10-CM | POA: Diagnosis not present

## 2024-07-17 ENCOUNTER — Inpatient Hospital Stay: Attending: Hematology and Oncology | Admitting: Hematology and Oncology

## 2024-07-17 ENCOUNTER — Other Ambulatory Visit: Payer: Self-pay

## 2024-07-17 ENCOUNTER — Inpatient Hospital Stay

## 2024-07-17 VITALS — BP 120/34 | HR 102 | Temp 97.9°F | Resp 18 | Ht 67.0 in | Wt 285.9 lb

## 2024-07-17 DIAGNOSIS — D696 Thrombocytopenia, unspecified: Secondary | ICD-10-CM

## 2024-07-17 DIAGNOSIS — D649 Anemia, unspecified: Secondary | ICD-10-CM

## 2024-07-17 DIAGNOSIS — Z87891 Personal history of nicotine dependence: Secondary | ICD-10-CM | POA: Insufficient documentation

## 2024-07-17 DIAGNOSIS — D72819 Decreased white blood cell count, unspecified: Secondary | ICD-10-CM | POA: Diagnosis not present

## 2024-07-17 LAB — CBC WITH DIFFERENTIAL (CANCER CENTER ONLY)
Abs Immature Granulocytes: 0.01 K/uL (ref 0.00–0.07)
Basophils Absolute: 0 K/uL (ref 0.0–0.1)
Basophils Relative: 0 %
Eosinophils Absolute: 0 K/uL (ref 0.0–0.5)
Eosinophils Relative: 2 %
HCT: 22 % — ABNORMAL LOW (ref 36.0–46.0)
Hemoglobin: 6.8 g/dL — CL (ref 12.0–15.0)
Immature Granulocytes: 0 %
Lymphocytes Relative: 12 %
Lymphs Abs: 0.3 K/uL — ABNORMAL LOW (ref 0.7–4.0)
MCH: 26.8 pg (ref 26.0–34.0)
MCHC: 30.9 g/dL (ref 30.0–36.0)
MCV: 86.6 fL (ref 80.0–100.0)
Monocytes Absolute: 0.2 K/uL (ref 0.1–1.0)
Monocytes Relative: 6 %
Neutro Abs: 2.1 K/uL (ref 1.7–7.7)
Neutrophils Relative %: 80 %
Platelet Count: 25 K/uL — ABNORMAL LOW (ref 150–400)
RBC: 2.54 MIL/uL — ABNORMAL LOW (ref 3.87–5.11)
RDW: 17.5 % — ABNORMAL HIGH (ref 11.5–15.5)
WBC Count: 2.7 K/uL — ABNORMAL LOW (ref 4.0–10.5)
nRBC: 0 % (ref 0.0–0.2)

## 2024-07-17 LAB — FOLATE: Folate: 9.7 ng/mL (ref >5.9)

## 2024-07-17 LAB — SAMPLE TO BLOOD BANK

## 2024-07-17 LAB — FERRITIN: Ferritin: 69 ng/mL (ref 11–307)

## 2024-07-17 LAB — LACTATE DEHYDROGENASE: LDH: 205 U/L — ABNORMAL HIGH (ref 98–192)

## 2024-07-17 LAB — VITAMIN B12: Vitamin B-12: 501 pg/mL (ref 180–914)

## 2024-07-17 LAB — PREPARE RBC (CROSSMATCH)

## 2024-07-17 NOTE — Progress Notes (Signed)
 [facility] CONSULT NOTE  Patient Care Team: Regino Slater, MD as PCP - General (Family Medicine) Raford Riggs, MD as PCP - Cardiology (Cardiology) Amadeo Windell SAILOR, MD (Inactive) as Consulting Physician (Hematology and Oncology)  CHIEF COMPLAINTS/PURPOSE OF CONSULTATION:  Severe anemia requiring blood transfusions  HISTORY OF PRESENTING ILLNESS:   History of Present Illness Hannah Khan is a 68 year old female with chronic anemia and thrombocytopenia who presents for evaluation of recent severe anemia and blood transfusion needs. She is accompanied by her husband, Jerel. She was referred by her local doctor for management of her anemia and blood transfusion needs.  She has chronic anemia and thrombocytopenia, with recent worsening of anemia. Hemoglobin levels have decreased to 6.3 g/dL, necessitating blood transfusions, including three units during her last hospitalization. She experiences significant fatigue, stating she is unable to perform daily activities.  In February, she had an episode of hematemesis while on a cruise, leading to an emergency evacuation and transfusion of three units of blood. Despite extensive evaluation, the bleeding source was not identified. No further episodes of hematemesis have occurred since.  Her platelet count remains low, ranging from 13,000 to 30,000 per microliter. Her white blood cell count is typically in the low threes to twos, with a recent count of 2.9. She undergoes regular blood work every two weeks for monitoring.    I reviewed her records extensively and collaborated the history with the patient.   MEDICAL HISTORY:  Past Medical History:  Diagnosis Date   Anemia    hx of   Aortic stenosis 02/2023   Echo 03/07/23 LVEF 55-60%, mild to moderate AS   Arthritis    knees   CHF (congestive heart failure) (HCC)    chronic diastolic   Diabetes mellitus without complication (HCC)    type 2   Dyspnea    Dysrhythmia     Fibromyalgia    Gastric varices    large   H/O transfusion of platelets    Heart murmur    never has caused any problems   Hx of colonic polyps    s/p partial colectomy   Hyperlipidemia    Hypertension    Joint pain    in knees and back spasms   Left leg swelling    wear compression hose   Liver cirrhosis secondary to NASH (nonalcoholic steatohepatitis) (HCC) dx nov 2014   had enlarged spleen also    Morbid obesity (HCC)    Persistent atrial fibrillation (HCC)    Pneumonia 10/2016   x 5   PONV (postoperative nausea and vomiting)    in past none recent   Portal hypertension (HCC)    Sleep apnea    uses cpap, setting varies between 14-20   Spleen enlarged    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     SURGICAL HISTORY: Past Surgical History:  Procedure Laterality Date   BREAST LUMPECTOMY WITH RADIOACTIVE SEED LOCALIZATION Right 08/29/2017   Procedure: RIGHT BREAST LUMPECTOMY WITH RADIOACTIVE SEEDS LOCALIZATION;  Surgeon: Vanderbilt Ned, MD;  Location: [facility] OR;  Service: General;  Laterality: Right;   CESAREAN SECTION  1989   CHOLECYSTECTOMY  20 yrs ago   COLECTOMY  2011   COLONOSCOPY     COLONOSCOPY N/A 09/05/2018   Procedure: COLONOSCOPY;  Surgeon: Burnette Fallow, MD;  Location: [facility];  Service: Endoscopy;  Laterality: N/A;   COLONOSCOPY WITH PROPOFOL  Bilateral 05/04/2022   Procedure: COLONOSCOPY WITH PROPOFOL ;  Surgeon: Burnette Fallow, MD;  Location: [facility];  Service: Gastroenterology;  Laterality: Bilateral;   DILATATION & CURETTAGE/HYSTEROSCOPY WITH MYOSURE N/A 05/06/2016   Procedure: DILATATION & CURETTAGE/HYSTEROSCOPY WITH MYOSURE WITH POLYPECTOMY;  Surgeon: Delon Prude, DO;  Location: [facility] ORS;  Service: Gynecology;  Laterality: N/A;   ESOPHAGOGASTRODUODENOSCOPY (EGD) WITH PROPOFOL  N/A 09/04/2013   Procedure: ESOPHAGOGASTRODUODENOSCOPY (EGD) WITH PROPOFOL ;  Surgeon: Elsie Cree, MD;  Location: [facility];  Service: Endoscopy;  Laterality: N/A;    ESOPHAGOGASTRODUODENOSCOPY (EGD) WITH PROPOFOL  Bilateral 05/04/2022   Procedure: ESOPHAGOGASTRODUODENOSCOPY (EGD) WITH PROPOFOL ;  Surgeon: Cree Elsie, MD;  Location: [facility];  Service: Gastroenterology;  Laterality: Bilateral;   IR EMBO ART  VEN HEMORR LYMPH EXTRAV  INC GUIDE ROADMAPPING  06/30/2023   IR INTRAVASCULAR ULTRASOUND NON CORONARY  12/23/2022   IR RADIOLOGIST EVAL & MGMT  09/21/2022   IR RADIOLOGIST EVAL & MGMT  10/06/2022   IR RADIOLOGIST EVAL & MGMT  11/02/2022   IR RADIOLOGIST EVAL & MGMT  01/31/2023   IR RADIOLOGIST EVAL & MGMT  05/31/2023   IR RADIOLOGIST EVAL & MGMT  08/21/2023   IR RADIOLOGIST EVAL & MGMT  12/25/2023   IR TIPS  12/23/2022   IR TIPS  06/30/2023   IR US  GUIDE VASC ACCESS LEFT  06/30/2023   IR US  GUIDE VASC ACCESS RIGHT  12/23/2022   IR US  GUIDE VASC ACCESS RIGHT  06/30/2023   IR US  GUIDE VASC ACCESS RIGHT  06/30/2023   KNEE ARTHROSCOPY  yrs ago   bilateral, one done x 1, one done twice   POLYPECTOMY  09/05/2018   Procedure: POLYPECTOMY;  Surgeon: Cree Elsie, MD;  Location: [facility];  Service: Endoscopy;;   RADIOLOGY WITH ANESTHESIA N/A 12/23/2022   Procedure: TIPS;  Surgeon: Jennefer Ester PARAS, MD;  Location: [facility] OR;  Service: Radiology;  Laterality: N/A;   RIGHT HEART CATH N/A 05/19/2023   Procedure: RIGHT HEART CATH;  Surgeon: Gardenia Led, DO;  Location: [facility] INVAS[facility] CV [facility];  Service: Cardiovascular;  Laterality: N/A;   TIPS PROCEDURE N/A 06/30/2023   Procedure: TRANS-JUGULAR INTRAHEPATIC PORTAL SHUNT (TIPS);  Surgeon: Jennefer Ester PARAS, MD;  Location: [facility] OR;  Service: Radiology;  Laterality: N/A;    SOCIAL HISTORY: Social History   Socioeconomic History   Marital status: Married    Spouse name: Not on file   Number of children: Not on file   Years of education: Not on file   Highest education level: Not on file  Occupational History   Occupation: Scientist, Product/process Development  Tobacco Use   Smoking status: Former    Current packs/day: 0.00    Average  packs/day: 1 pack/day for 3.0 years (3.0 ttl pk-yrs)    Types: Cigarettes    Start date: 09/20/1971    Quit date: 09/19/1974    Years since quitting: 49.8   Smokeless tobacco: Never  Vaping Use   Vaping status: Never Used  Substance and Sexual Activity   Alcohol  use: Not Currently    Comment: occ   Drug use: No   Sexual activity: Not on file  Other Topics Concern   Not on file  Social History Narrative   Lives in [place] with her husband.  Instructor for early childhood development.     Social Drivers of Corporate Investment Banker Strain: Not on file  Food Insecurity: No Food Insecurity (07/17/2024)   Hunger Vital Sign    Worried About Running Out of Food in the Last Year: Never true    Ran Out of Food in the Last Year: Never true  Transportation Needs: No Transportation Needs (07/17/2024)   PRAPARE - Administrator, Civil Service (Medical): No    Lack of Transportation (Non-Medical): No  Physical Activity: Not on file  Stress: Not on file  Social Connections: Socially Integrated (06/12/2024)   Social Connection and Isolation Panel    Frequency of Communication with Friends and Family: More than three times a week    Frequency of Social Gatherings with Friends and Family: More than three times a week    Attends Religious Services: 1 to 4 times per year    Active Member of [employer] or Organizations: No    Attends Engineer, Structural: 1 to 4 times per year    Marital Status: Married  Catering Manager Violence: Not At Risk (07/17/2024)   Humiliation, Afraid, Rape, and Kick questionnaire    Fear of Current or Ex-Partner: No    Emotionally Abused: No    Physically Abused: No    Sexually Abused: No    FAMILY HISTORY: Family History  Problem Relation Age of Onset   Heart failure Mother    Cancer Mother    Hyperlipidemia Mother    Congestive Heart Failure Mother    Stroke Mother    Lung cancer Father    Cancer Father    Cancer Brother     Hyperlipidemia Brother    Stroke Other    Breast cancer Neg Hx     ALLERGIES:  is allergic to celebrex [celecoxib], shellfish allergy, tomato, barium-containing compounds, fentanyl , nsaids, prednisone, sglt2 inhibitors, statins, tylenol  [acetaminophen ], ibuprofen , tramadol , and zetia [ezetimibe].  MEDICATIONS:  Current Outpatient Medications  Medication Sig Dispense Refill   albuterol  (VENTOLIN  HFA) 108 (90 Base) MCG/ACT inhaler Inhale 1-2 puffs into the lungs every 6 (six) hours as needed for wheezing or shortness of breath.     digoxin  (LANOXIN ) 0.125 MG tablet TAKE 1 TABLET BY MOUTH EVERY DAY 90 tablet 4   lactulose  (CHRONULAC ) 10 GM/15ML solution Take 20 g by mouth 3 (three) times daily.     methocarbamol  (ROBAXIN ) 750 MG tablet Take 1 tablet (750 mg total) by mouth every 8 (eight) hours as needed for muscle spasms. 15 tablet 0   metolazone  (ZAROXOLYN ) 2.5 MG tablet Take 1 tablet (2.5 mg total) by mouth as needed. 3 tablet 1   metoprolol  tartrate (LOPRESSOR ) 50 MG tablet Take 1/2 tablet by mouth in the a.m and 1 tablet by mouth in the p.m     midodrine  (PROAMATINE ) 10 MG tablet Take 1.5 tablets (15 mg total) by mouth 3 (three) times daily. 135 tablet 11   Multiple Vitamins-Minerals (PRESERVISION AREDS 2) CAPS Take 1 capsule by mouth daily.     pantoprazole  (PROTONIX ) 40 MG tablet Take 40 mg by mouth 2 (two) times daily.     potassium chloride  (MICRO-K ) 10 MEQ CR capsule Take 4 capsules (40 mEq total) by mouth 2 (two) times daily. Take an extra 4 tablets when taking Metolazone .     Propylene Glycol (SYSTANE BALANCE) 0.6 % SOLN Place 1 drop into both eyes 2 (two) times daily.     spironolactone  (ALDACTONE ) 25 MG tablet TAKE 3 TABLETS BY MOUTH DAILY 270 tablet 1   torsemide  (DEMADEX ) 20 MG tablet Take 4 tablets (80 mg total) by mouth in the morning and 2 tablets (40 mg total) in the evening     TRESIBA  FLEXTOUCH 100 UNIT/ML FlexTouch Pen Inject 36 Units into the skin daily. (TITRATE WHEN  DIRECTED UP TO 50 UNITS)  XIFAXAN  550 MG TABS tablet Take 550 mg by mouth 2 (two) times daily.     No current facility-administered medications for this visit.    REVIEW OF SYSTEMS:   Constitutional: Denies fevers, chills or abnormal night sweats All other systems were reviewed with the patient and are negative.  PHYSICAL EXAMINATION: ECOG PERFORMANCE STATUS: 3 - Symptomatic, >50% confined to bed  Vitals:   07/17/24 1540  BP: (!) 120/34  Pulse: (!) 102  Resp: 18  Temp: 97.9 F (36.6 C)  SpO2: 99%   Filed Weights   07/17/24 1540  Weight: 285 lb 14.4 oz (129.7 kg)    GENERAL: Wheelchair-bound, fatigue shortness of breath or exertion  LABORATORY DATA:  I have reviewed the data as listed Lab Results  Component Value Date   WBC 4.1 06/13/2024   HGB 8.3 (L) 06/13/2024   HCT 27.0 (L) 06/13/2024   MCV 90.9 06/13/2024   PLT 31 (L) 06/13/2024   Lab Results  Component Value Date   NA 135 06/13/2024   K 4.2 06/13/2024   CL 107 06/13/2024   CO2 22 06/13/2024    RADIOGRAPHIC STUDIES: I have personally reviewed the radiological reports and agreed with the findings in the report.  ASSESSMENT AND PLAN:  Symptomatic anemia Lab review: 04/24/2024: Hemoglobin 7.4, platelets 31 06/10/2024: Hemoglobin 5.6, platelets 28, WBC 3.1 06/13/2024: Hemoglobin 8.3, MCV 90.9, platelets 31, WBC 4.1 07/17/2024: Hemoglobin 6.8, platelets 25 (2 units of PRBC being planned for 07/20/2024)  Longstanding anemia and thrombocytopenia secondary to cirrhosis of the liver with hypersplenism.  Because of anemia suspected to be upper GI bleed from varices.  Blood transfusions: During recent hospitalization she received 2 units of PRBC  Treatment plan: Evaluate every 2 weeks for labs and transfuse as needed Check iron studies B12 and folic acid  Chronic thrombocytopenia: Will continue to watch and monitor.  Platelet counts are pretty steady between 25 and 30  Patient will come every 2 weeks for lab  check and transfusion as needed. I will see the patient back in 2 months.  Assessment and Plan Assessment & Plan Chronic anemia Chronic anemia with recent exacerbation, hemoglobin as low as 5.6 g/dL. Current hemoglobin level is 7.8 g/dL. Target hemoglobin level is around 8 g/dL to improve symptoms. - Draw blood for CBC and type and crossmatch every two weeks. - Administer blood transfusions as needed to maintain hemoglobin around 8 g/dL. - Order B12 and folate levels to assess for deficiencies.  Chronic thrombocytopenia Chronic thrombocytopenia with platelet counts consistently low, around 30,000/L. Platelet count was 31,000/L at last hospital discharge.  Chronic leukopenia Chronic leukopenia with white blood cell counts typically in the range of 2,000-3,000/L. Current white blood cell count is 2.9 x 10^9/L, consistent with previous levels.    All questions were answered. The patient knows to call the clinic with any problems, questions or concerns.  I personally spent a total of 60 minutes in the care of the patient today including preparing to see the patient, getting/reviewing separately obtained history, performing a medically appropriate exam/evaluation, counseling and educating, placing orders, referring and communicating with other health care professionals, documenting clinical information in the EHR, independently interpreting results, communicating results, and coordinating care.    Viinay K Orel Hord, MD 07/17/24

## 2024-07-17 NOTE — Progress Notes (Signed)
 Orders placed for 2 units PRBC per MD, as well as premeds but WITHOUT Acetaminophen  d/t CKD3 and GI comorbidities.   She understands she must keep blue BB bracelet intact for appt and will arrive 07/20/24 at 0745 for check in.   Order placed for consent.

## 2024-07-17 NOTE — Assessment & Plan Note (Signed)
 Lab review: 04/24/2024: Hemoglobin 7.4, platelets 31 06/10/2024: Hemoglobin 5.6, platelets 28, WBC 3.1 06/13/2024: Hemoglobin 8.3, MCV 90.9, platelets 31, WBC 4.1  Longstanding anemia and thrombocytopenia secondary to cirrhosis of the liver with hypersplenism.  Because of anemia suspected to be upper GI bleed from varices.  Blood transfusions: During recent hospitalization she received 2 units of PRBC  Treatment plan: Evaluate every 2 weeks for labs and transfuse as needed Check iron studies B12 and folic acid  Chronic thrombocytopenia: Will continue to watch and monitor.  Platelet counts are pretty steady between 25 and 30  Patient will come every 2 weeks for lab check and transfusion as needed. I will see the patient back in 2 months.

## 2024-07-18 LAB — IRON AND IRON BINDING CAPACITY (CC-WL,HP ONLY)
Iron: 30 ug/dL (ref 28–170)
Saturation Ratios: 8 % — ABNORMAL LOW (ref 10.4–31.8)
TIBC: 332 ug/dL (ref 250–450)
UIBC: 304 ug/dL (ref 148–442)

## 2024-07-20 ENCOUNTER — Inpatient Hospital Stay: Attending: Hematology and Oncology

## 2024-07-20 DIAGNOSIS — D649 Anemia, unspecified: Secondary | ICD-10-CM | POA: Insufficient documentation

## 2024-07-20 DIAGNOSIS — D696 Thrombocytopenia, unspecified: Secondary | ICD-10-CM | POA: Insufficient documentation

## 2024-07-20 MED ORDER — DIPHENHYDRAMINE HCL 25 MG PO CAPS
25.0000 mg | ORAL_CAPSULE | Freq: Once | ORAL | Status: AC
  Administered 2024-07-20: 25 mg via ORAL
  Filled 2024-07-20: qty 1

## 2024-07-20 MED ORDER — SODIUM CHLORIDE 0.9% IV SOLUTION
250.0000 mL | INTRAVENOUS | Status: DC
  Administered 2024-07-20: 100 mL via INTRAVENOUS

## 2024-07-20 NOTE — Patient Instructions (Signed)

## 2024-07-22 LAB — BPAM RBC
Blood Product Expiration Date: 202511262359
Blood Product Expiration Date: 202511262359
ISSUE DATE / TIME: 202511010851
ISSUE DATE / TIME: 202511010851
Unit Type and Rh: 5100
Unit Type and Rh: 5100

## 2024-07-22 LAB — TYPE AND SCREEN
ABO/RH(D): O POS
Antibody Screen: NEGATIVE
Unit division: 0
Unit division: 0

## 2024-07-24 ENCOUNTER — Other Ambulatory Visit: Payer: Self-pay

## 2024-07-24 ENCOUNTER — Emergency Department (HOSPITAL_COMMUNITY)

## 2024-07-24 ENCOUNTER — Inpatient Hospital Stay (HOSPITAL_COMMUNITY)
Admission: EM | Admit: 2024-07-24 | Discharge: 2024-07-29 | DRG: 811 | Disposition: A | Discharge status: Home / Self Care | Attending: Internal Medicine | Admitting: Internal Medicine

## 2024-07-24 ENCOUNTER — Inpatient Hospital Stay (HOSPITAL_COMMUNITY)

## 2024-07-24 ENCOUNTER — Encounter (HOSPITAL_COMMUNITY): Payer: Self-pay | Admitting: Internal Medicine

## 2024-07-24 DIAGNOSIS — I5032 Chronic diastolic (congestive) heart failure: Secondary | ICD-10-CM | POA: Diagnosis not present

## 2024-07-24 DIAGNOSIS — I700 Atherosclerosis of aorta: Secondary | ICD-10-CM | POA: Diagnosis present

## 2024-07-24 DIAGNOSIS — I4821 Permanent atrial fibrillation: Secondary | ICD-10-CM | POA: Diagnosis not present

## 2024-07-24 DIAGNOSIS — R1084 Generalized abdominal pain: Secondary | ICD-10-CM

## 2024-07-24 DIAGNOSIS — N1832 Chronic kidney disease, stage 3b: Secondary | ICD-10-CM | POA: Diagnosis present

## 2024-07-24 DIAGNOSIS — I864 Gastric varices: Secondary | ICD-10-CM | POA: Diagnosis not present

## 2024-07-24 DIAGNOSIS — Z6841 Body Mass Index (BMI) 40.0 and over, adult: Secondary | ICD-10-CM

## 2024-07-24 DIAGNOSIS — I8510 Secondary esophageal varices without bleeding: Secondary | ICD-10-CM | POA: Diagnosis not present

## 2024-07-24 DIAGNOSIS — D649 Anemia, unspecified: Secondary | ICD-10-CM | POA: Diagnosis not present

## 2024-07-24 DIAGNOSIS — R161 Splenomegaly, not elsewhere classified: Secondary | ICD-10-CM | POA: Diagnosis not present

## 2024-07-24 DIAGNOSIS — Z79899 Other long term (current) drug therapy: Secondary | ICD-10-CM

## 2024-07-24 DIAGNOSIS — N179 Acute kidney failure, unspecified: Secondary | ICD-10-CM | POA: Diagnosis present

## 2024-07-24 DIAGNOSIS — E66813 Obesity, class 3: Secondary | ICD-10-CM | POA: Diagnosis present

## 2024-07-24 DIAGNOSIS — K3189 Other diseases of stomach and duodenum: Secondary | ICD-10-CM | POA: Diagnosis present

## 2024-07-24 DIAGNOSIS — I130 Hypertensive heart and chronic kidney disease with heart failure and stage 1 through stage 4 chronic kidney disease, or unspecified chronic kidney disease: Secondary | ICD-10-CM | POA: Diagnosis not present

## 2024-07-24 DIAGNOSIS — R002 Palpitations: Secondary | ICD-10-CM | POA: Diagnosis not present

## 2024-07-24 DIAGNOSIS — R918 Other nonspecific abnormal finding of lung field: Secondary | ICD-10-CM | POA: Diagnosis not present

## 2024-07-24 DIAGNOSIS — K7460 Unspecified cirrhosis of liver: Secondary | ICD-10-CM | POA: Diagnosis present

## 2024-07-24 DIAGNOSIS — I4891 Unspecified atrial fibrillation: Secondary | ICD-10-CM | POA: Diagnosis present

## 2024-07-24 DIAGNOSIS — M797 Fibromyalgia: Secondary | ICD-10-CM | POA: Diagnosis present

## 2024-07-24 DIAGNOSIS — I9589 Other hypotension: Secondary | ICD-10-CM | POA: Diagnosis present

## 2024-07-24 DIAGNOSIS — Z9049 Acquired absence of other specified parts of digestive tract: Secondary | ICD-10-CM

## 2024-07-24 DIAGNOSIS — R933 Abnormal findings on diagnostic imaging of other parts of digestive tract: Secondary | ICD-10-CM | POA: Diagnosis not present

## 2024-07-24 DIAGNOSIS — I350 Nonrheumatic aortic (valve) stenosis: Secondary | ICD-10-CM | POA: Diagnosis present

## 2024-07-24 DIAGNOSIS — Z7901 Long term (current) use of anticoagulants: Secondary | ICD-10-CM | POA: Diagnosis not present

## 2024-07-24 DIAGNOSIS — I8500 Esophageal varices without bleeding: Secondary | ICD-10-CM | POA: Diagnosis not present

## 2024-07-24 DIAGNOSIS — G4733 Obstructive sleep apnea (adult) (pediatric): Secondary | ICD-10-CM | POA: Diagnosis present

## 2024-07-24 DIAGNOSIS — I5033 Acute on chronic diastolic (congestive) heart failure: Secondary | ICD-10-CM | POA: Diagnosis not present

## 2024-07-24 DIAGNOSIS — I110 Hypertensive heart disease with heart failure: Secondary | ICD-10-CM | POA: Diagnosis not present

## 2024-07-24 DIAGNOSIS — K7581 Nonalcoholic steatohepatitis (NASH): Secondary | ICD-10-CM | POA: Diagnosis present

## 2024-07-24 DIAGNOSIS — Z860100 Personal history of colon polyps, unspecified: Secondary | ICD-10-CM

## 2024-07-24 DIAGNOSIS — Z886 Allergy status to analgesic agent status: Secondary | ICD-10-CM

## 2024-07-24 DIAGNOSIS — R188 Other ascites: Secondary | ICD-10-CM | POA: Diagnosis not present

## 2024-07-24 DIAGNOSIS — D6959 Other secondary thrombocytopenia: Secondary | ICD-10-CM | POA: Diagnosis not present

## 2024-07-24 DIAGNOSIS — D500 Iron deficiency anemia secondary to blood loss (chronic): Principal | ICD-10-CM | POA: Diagnosis present

## 2024-07-24 DIAGNOSIS — Z8719 Personal history of other diseases of the digestive system: Secondary | ICD-10-CM | POA: Diagnosis not present

## 2024-07-24 DIAGNOSIS — K766 Portal hypertension: Secondary | ICD-10-CM | POA: Diagnosis not present

## 2024-07-24 DIAGNOSIS — Z801 Family history of malignant neoplasm of trachea, bronchus and lung: Secondary | ICD-10-CM

## 2024-07-24 DIAGNOSIS — Z83438 Family history of other disorder of lipoprotein metabolism and other lipidemia: Secondary | ICD-10-CM

## 2024-07-24 DIAGNOSIS — I482 Chronic atrial fibrillation, unspecified: Principal | ICD-10-CM

## 2024-07-24 DIAGNOSIS — Z91013 Allergy to seafood: Secondary | ICD-10-CM

## 2024-07-24 DIAGNOSIS — Z885 Allergy status to narcotic agent status: Secondary | ICD-10-CM

## 2024-07-24 DIAGNOSIS — Z823 Family history of stroke: Secondary | ICD-10-CM

## 2024-07-24 DIAGNOSIS — E785 Hyperlipidemia, unspecified: Secondary | ICD-10-CM | POA: Diagnosis present

## 2024-07-24 DIAGNOSIS — Z91018 Allergy to other foods: Secondary | ICD-10-CM

## 2024-07-24 DIAGNOSIS — Z794 Long term (current) use of insulin: Secondary | ICD-10-CM | POA: Diagnosis not present

## 2024-07-24 DIAGNOSIS — R0602 Shortness of breath: Secondary | ICD-10-CM | POA: Diagnosis not present

## 2024-07-24 DIAGNOSIS — R0989 Other specified symptoms and signs involving the circulatory and respiratory systems: Secondary | ICD-10-CM | POA: Diagnosis not present

## 2024-07-24 DIAGNOSIS — J449 Chronic obstructive pulmonary disease, unspecified: Secondary | ICD-10-CM | POA: Diagnosis not present

## 2024-07-24 DIAGNOSIS — D631 Anemia in chronic kidney disease: Secondary | ICD-10-CM | POA: Diagnosis present

## 2024-07-24 DIAGNOSIS — I5022 Chronic systolic (congestive) heart failure: Secondary | ICD-10-CM | POA: Diagnosis not present

## 2024-07-24 DIAGNOSIS — Z809 Family history of malignant neoplasm, unspecified: Secondary | ICD-10-CM

## 2024-07-24 DIAGNOSIS — Z888 Allergy status to other drugs, medicaments and biological substances status: Secondary | ICD-10-CM

## 2024-07-24 DIAGNOSIS — I890 Lymphedema, not elsewhere classified: Secondary | ICD-10-CM | POA: Diagnosis present

## 2024-07-24 DIAGNOSIS — Z87891 Personal history of nicotine dependence: Secondary | ICD-10-CM

## 2024-07-24 DIAGNOSIS — Z8249 Family history of ischemic heart disease and other diseases of the circulatory system: Secondary | ICD-10-CM

## 2024-07-24 DIAGNOSIS — R42 Dizziness and giddiness: Secondary | ICD-10-CM | POA: Diagnosis not present

## 2024-07-24 DIAGNOSIS — R000 Tachycardia, unspecified: Secondary | ICD-10-CM | POA: Diagnosis not present

## 2024-07-24 DIAGNOSIS — E1122 Type 2 diabetes mellitus with diabetic chronic kidney disease: Secondary | ICD-10-CM | POA: Diagnosis present

## 2024-07-24 DIAGNOSIS — I499 Cardiac arrhythmia, unspecified: Secondary | ICD-10-CM | POA: Diagnosis not present

## 2024-07-24 DIAGNOSIS — D509 Iron deficiency anemia, unspecified: Secondary | ICD-10-CM | POA: Diagnosis not present

## 2024-07-24 LAB — URINALYSIS, W/ REFLEX TO CULTURE (INFECTION SUSPECTED)
Bilirubin Urine: NEGATIVE
Glucose, UA: NEGATIVE mg/dL
Hgb urine dipstick: NEGATIVE
Ketones, ur: NEGATIVE mg/dL
Leukocytes,Ua: NEGATIVE
Nitrite: NEGATIVE
Protein, ur: NEGATIVE mg/dL
Specific Gravity, Urine: 1.027 (ref 1.005–1.030)
pH: 5 (ref 5.0–8.0)

## 2024-07-24 LAB — CBC WITH DIFFERENTIAL/PLATELET
Abs Immature Granulocytes: 0.02 K/uL (ref 0.00–0.07)
Basophils Absolute: 0 K/uL (ref 0.0–0.1)
Basophils Relative: 0 %
Eosinophils Absolute: 0 K/uL (ref 0.0–0.5)
Eosinophils Relative: 0 %
HCT: 25.7 % — ABNORMAL LOW (ref 36.0–46.0)
Hemoglobin: 7.7 g/dL — ABNORMAL LOW (ref 12.0–15.0)
Immature Granulocytes: 1 %
Lymphocytes Relative: 3 %
Lymphs Abs: 0.1 K/uL — ABNORMAL LOW (ref 0.7–4.0)
MCH: 27.2 pg (ref 26.0–34.0)
MCHC: 30 g/dL (ref 30.0–36.0)
MCV: 90.8 fL (ref 80.0–100.0)
Monocytes Absolute: 0.4 K/uL (ref 0.1–1.0)
Monocytes Relative: 8 %
Neutro Abs: 3.7 K/uL (ref 1.7–7.7)
Neutrophils Relative %: 88 %
Platelets: 24 K/uL — CL (ref 150–400)
RBC: 2.83 MIL/uL — ABNORMAL LOW (ref 3.87–5.11)
RDW: 17.5 % — ABNORMAL HIGH (ref 11.5–15.5)
WBC: 4.2 K/uL (ref 4.0–10.5)
nRBC: 0 % (ref 0.0–0.2)

## 2024-07-24 LAB — GLUCOSE, CAPILLARY: Glucose-Capillary: 185 mg/dL — ABNORMAL HIGH (ref 70–99)

## 2024-07-24 LAB — COMPREHENSIVE METABOLIC PANEL WITH GFR
ALT: 17 U/L (ref 0–44)
AST: 29 U/L (ref 15–41)
Albumin: 3 g/dL — ABNORMAL LOW (ref 3.5–5.0)
Alkaline Phosphatase: 72 U/L (ref 38–126)
Anion gap: 14 (ref 5–15)
BUN: 28 mg/dL — ABNORMAL HIGH (ref 8–23)
CO2: 18 mmol/L — ABNORMAL LOW (ref 22–32)
Calcium: 8.7 mg/dL — ABNORMAL LOW (ref 8.9–10.3)
Chloride: 102 mmol/L (ref 98–111)
Creatinine, Ser: 1.56 mg/dL — ABNORMAL HIGH (ref 0.44–1.00)
GFR, Estimated: 36 mL/min — ABNORMAL LOW
Glucose, Bld: 135 mg/dL — ABNORMAL HIGH (ref 70–99)
Potassium: 4 mmol/L (ref 3.5–5.1)
Sodium: 134 mmol/L — ABNORMAL LOW (ref 135–145)
Total Bilirubin: 3.3 mg/dL — ABNORMAL HIGH (ref 0.0–1.2)
Total Protein: 6.8 g/dL (ref 6.5–8.1)

## 2024-07-24 LAB — LIPASE, BLOOD: Lipase: 45 U/L (ref 11–51)

## 2024-07-24 LAB — DIGOXIN LEVEL: Digoxin Level: 0.9 ng/mL (ref 0.8–2.0)

## 2024-07-24 LAB — BRAIN NATRIURETIC PEPTIDE: B Natriuretic Peptide: 292.4 pg/mL — ABNORMAL HIGH (ref 0.0–100.0)

## 2024-07-24 LAB — PROCALCITONIN: Procalcitonin: 5.64 ng/mL

## 2024-07-24 LAB — TROPONIN I (HIGH SENSITIVITY)
Troponin I (High Sensitivity): 30 ng/L — ABNORMAL HIGH
Troponin I (High Sensitivity): 44 ng/L — ABNORMAL HIGH (ref <18)

## 2024-07-24 MED ORDER — LIDOCAINE HCL 1 % IJ SOLN
INTRAMUSCULAR | Status: AC
  Filled 2024-07-24: qty 20

## 2024-07-24 MED ORDER — FUROSEMIDE 10 MG/ML IJ SOLN
80.0000 mg | Freq: Two times a day (BID) | INTRAMUSCULAR | Status: DC
  Administered 2024-07-24 (×2): 80 mg via INTRAVENOUS
  Filled 2024-07-24 (×2): qty 8

## 2024-07-24 MED ORDER — SODIUM CHLORIDE 0.9 % IV SOLN
1.0000 g | Freq: Once | INTRAVENOUS | Status: AC
  Administered 2024-07-24: 1 g via INTRAVENOUS
  Filled 2024-07-24: qty 10

## 2024-07-24 MED ORDER — MIDODRINE HCL 5 MG PO TABS
15.0000 mg | ORAL_TABLET | ORAL | Status: AC
  Administered 2024-07-24: 15 mg via ORAL
  Filled 2024-07-24: qty 3

## 2024-07-24 MED ORDER — SPIRONOLACTONE 100 MG PO TABS: 100.0000 mg | ORAL_TABLET | Freq: Every day | ORAL | Status: DC

## 2024-07-24 MED ORDER — METHOCARBAMOL 500 MG PO TABS
750.0000 mg | ORAL_TABLET | Freq: Three times a day (TID) | ORAL | Status: AC | PRN
Start: 2024-07-24 — End: ?
  Administered 2024-07-24 – 2024-07-28 (×5): 750 mg via ORAL
  Filled 2024-07-24 (×5): qty 2

## 2024-07-24 MED ORDER — DIGOXIN 0.25 MG/ML IJ SOLN
0.2500 mg | Freq: Once | INTRAMUSCULAR | Status: AC
  Administered 2024-07-24: 0.25 mg via INTRAVENOUS
  Filled 2024-07-24: qty 2

## 2024-07-24 MED ORDER — FUROSEMIDE 10 MG/ML IJ SOLN: 40.0000 mg | Freq: Two times a day (BID) | INTRAMUSCULAR | Status: DC

## 2024-07-24 MED ORDER — METOPROLOL TARTRATE 12.5 MG HALF TABLET
12.5000 mg | ORAL_TABLET | Freq: Four times a day (QID) | ORAL | Status: AC
Start: 2024-07-24 — End: 2024-07-25
  Administered 2024-07-24 – 2024-07-25 (×4): 12.5 mg via ORAL
  Filled 2024-07-24 (×5): qty 1

## 2024-07-24 MED ORDER — LACTULOSE 10 GM/15ML PO SOLN
20.0000 g | Freq: Three times a day (TID) | ORAL | Status: DC
  Administered 2024-07-24 – 2024-07-26 (×5): 20 g via ORAL
  Filled 2024-07-24 (×7): qty 30

## 2024-07-24 MED ORDER — DILTIAZEM HCL-DEXTROSE 125-5 MG/125ML-% IV SOLN (PREMIX)
5.0000 mg/h | INTRAVENOUS | Status: DC
  Administered 2024-07-24: 5 mg/h via INTRAVENOUS
  Filled 2024-07-24: qty 125

## 2024-07-24 MED ORDER — RIFAXIMIN 550 MG PO TABS
550.0000 mg | ORAL_TABLET | Freq: Two times a day (BID) | ORAL | Status: DC
  Administered 2024-07-24 – 2024-07-29 (×11): 550 mg via ORAL
  Filled 2024-07-24 (×12): qty 1

## 2024-07-24 MED ORDER — LIDOCAINE HCL 1 % IJ SOLN
20.0000 mL | Freq: Once | INTRAMUSCULAR | Status: AC
Start: 2024-07-24 — End: ?
  Filled 2024-07-24: qty 20

## 2024-07-24 MED ORDER — PIPERACILLIN-TAZOBACTAM 3.375 G IVPB
3.3750 g | Freq: Three times a day (TID) | INTRAVENOUS | Status: DC
  Administered 2024-07-24 – 2024-07-26 (×6): 3.375 g via INTRAVENOUS
  Filled 2024-07-24 (×7): qty 50

## 2024-07-24 MED ORDER — OXYCODONE-ACETAMINOPHEN 5-325 MG PO TABS
0.5000 | ORAL_TABLET | Freq: Once | ORAL | Status: AC
  Administered 2024-07-24: 0.5 via ORAL
  Filled 2024-07-24: qty 1

## 2024-07-24 MED ORDER — METOPROLOL TARTRATE 5 MG/5ML IV SOLN
5.0000 mg | INTRAVENOUS | Status: AC | PRN
  Administered 2024-07-24 (×2): 5 mg via INTRAVENOUS
  Filled 2024-07-24: qty 5

## 2024-07-24 MED ORDER — DILTIAZEM HCL-DEXTROSE 125-5 MG/125ML-% IV SOLN (PREMIX): 5.0000 mg/h | INTRAVENOUS | Status: DC

## 2024-07-24 MED ORDER — SODIUM CHLORIDE 0.9 % IV BOLUS
500.0000 mL | Freq: Once | INTRAVENOUS | Status: AC
  Administered 2024-07-24: 500 mL via INTRAVENOUS

## 2024-07-24 MED ORDER — IOHEXOL 350 MG/ML SOLN
75.0000 mL | Freq: Once | INTRAVENOUS | Status: AC | PRN
  Administered 2024-07-24: 75 mL via INTRAVENOUS

## 2024-07-24 MED ORDER — PANTOPRAZOLE SODIUM 40 MG IV SOLR
40.0000 mg | Freq: Two times a day (BID) | INTRAVENOUS | Status: DC
  Administered 2024-07-24 – 2024-07-29 (×11): 40 mg via INTRAVENOUS
  Filled 2024-07-24 (×11): qty 10

## 2024-07-24 NOTE — ED Triage Notes (Signed)
 PT arrives via EMS from home with a complaint of abdominal pain and feeling like her heard was racing for a few hours tonight. Pt has a Hx of A-fib RVR and is always in A-fib. HR 160-200 per ems upon arrival.  Received 2x 10mg  doses of cardizem  from EMS.

## 2024-07-24 NOTE — ED Provider Notes (Signed)
 [place] EMERGENCY DEPARTMENT AT [facility] Provider Note   CSN: [id number] Arrival date & time: 07/24/24  9940     Patient presents with: Tachycardia   VERTA RIEDLINGER is a 68 y.o. female.   Patient presents to the emergency department for evaluation of multiple complaints.  Patient reports that she has been having chest, abdominal, flank and back pain.  Patient reports a history of atrial fibrillation, heart rate has been racing.  Patient comes by ambulance.  EMS has given the patient Cardizem  10 mg IV push x 2 during transport.       Prior to Admission medications   Medication Sig Start Date End Date Taking? Authorizing Provider  albuterol  (VENTOLIN  HFA) 108 (90 Base) MCG/ACT inhaler Inhale 1-2 puffs into the lungs every 6 (six) hours as needed for wheezing or shortness of breath.    [provider]  digoxin  (LANOXIN ) 0.125 MG tablet TAKE 1 TABLET BY MOUTH EVERY DAY 04/17/24   Sabharwal, Aditya, DO  lactulose  (CHRONULAC ) 10 GM/15ML solution Take 20 g by mouth 3 (three) times daily. 03/17/21   [provider]  methocarbamol  (ROBAXIN ) 750 MG tablet Take 1 tablet (750 mg total) by mouth every 8 (eight) hours as needed for muscle spasms. 06/13/24   Darci Pore, MD  metolazone  (ZAROXOLYN ) 2.5 MG tablet Take 1 tablet (2.5 mg total) by mouth as needed. 05/29/24   Glena Harlene HERO, FNP  metoprolol  tartrate (LOPRESSOR ) 50 MG tablet Take 1/2 tablet by mouth in the a.m and 1 tablet by mouth in the p.m 05/01/24   Swinyer, Rosaline HERO, NP  midodrine  (PROAMATINE ) 10 MG tablet Take 1.5 tablets (15 mg total) by mouth 3 (three) times daily. 05/29/24   Milford, Harlene HERO, FNP  Multiple Vitamins-Minerals (PRESERVISION AREDS 2) CAPS Take 1 capsule by mouth daily.    [provider]  pantoprazole  (PROTONIX ) 40 MG tablet Take 40 mg by mouth 2 (two) times daily. 11/17/23   [provider]  potassium chloride  (MICRO-K ) 10 MEQ CR capsule Take 4 capsules  (40 mEq total) by mouth 2 (two) times daily. Take an extra 4 tablets when taking Metolazone . 04/24/24   Jillian Buttery, MD  Propylene Glycol (SYSTANE BALANCE) 0.6 % SOLN Place 1 drop into both eyes 2 (two) times daily.    [provider]  spironolactone  (ALDACTONE ) 25 MG tablet TAKE 3 TABLETS BY MOUTH DAILY 03/05/24   Sabharwal, Aditya, DO  torsemide  (DEMADEX ) 20 MG tablet Take 4 tablets (80 mg total) by mouth in the morning and 2 tablets (40 mg total) in the evening 04/24/24   Jillian Buttery, MD  TRESIBA  FLEXTOUCH 100 UNIT/ML FlexTouch Pen Inject 36 Units into the skin daily. (TITRATE WHEN DIRECTED UP TO 50 UNITS) 01/22/24   [provider]  XIFAXAN  550 MG TABS tablet Take 550 mg by mouth 2 (two) times daily. 03/13/21   [provider]    Allergies: Celebrex [celecoxib], Shellfish allergy, Tomato, Barium-containing compounds, Fentanyl , Nsaids, Prednisone, Sglt2 inhibitors, Statins, Tylenol  [acetaminophen ], Ibuprofen , Tramadol , and Zetia [ezetimibe]    Review of Systems  Updated Vital Signs BP (!) 95/57   Pulse (!) 166   Temp 98.5 F (36.9 C) (Oral)   Resp 19   Ht 5' 7 (1.702 m)   Wt 130 kg   SpO2 96%   BMI 44.89 kg/m   Physical Exam Vitals and nursing note reviewed.  Constitutional:      General: She is in acute distress.     Appearance: She  is well-developed.  HENT:     Head: Normocephalic and atraumatic.     Mouth/Throat:     Mouth: Mucous membranes are moist.  Eyes:     General: Vision grossly intact. Gaze aligned appropriately.     Extraocular Movements: Extraocular movements intact.     Conjunctiva/sclera: Conjunctivae normal.  Cardiovascular:     Rate and Rhythm: Tachycardia present. Rhythm irregular.     Pulses: Normal pulses.     Heart sounds: S1 normal and S2 normal. Murmur heard.     No friction rub. No gallop.  Pulmonary:     Effort: Pulmonary effort is normal. No respiratory distress.     Breath sounds: Normal breath sounds.  Abdominal:      General: Bowel sounds are normal.     Palpations: Abdomen is soft.     Tenderness: There is generalized abdominal tenderness. There is no guarding or rebound.     Hernia: No hernia is present.  Musculoskeletal:        General: No swelling.     Cervical back: Full passive range of motion without pain, normal range of motion and neck supple. No spinous process tenderness or muscular tenderness. Normal range of motion.     Right lower leg: Edema present.     Left lower leg: Edema present.  Skin:    General: Skin is warm and dry.     Capillary Refill: Capillary refill takes less than 2 seconds.     Findings: No ecchymosis, erythema, rash or wound.  Neurological:     General: No focal deficit present.     Mental Status: She is alert and oriented to person, place, and time.     GCS: GCS eye subscore is 4. GCS verbal subscore is 5. GCS motor subscore is 6.     Cranial Nerves: Cranial nerves 2-12 are intact.     Sensory: Sensation is intact.     Motor: Motor function is intact.     Coordination: Coordination is intact.  Psychiatric:        Attention and Perception: Attention normal.        Mood and Affect: Mood normal.        Speech: Speech normal.        Behavior: Behavior normal.     (all labs ordered are listed, but only abnormal results are displayed) Labs Reviewed  CBC WITH DIFFERENTIAL/PLATELET - Abnormal; Notable for the following components:      Result Value   RBC 2.83 (*)    Hemoglobin 7.7 (*)    HCT 25.7 (*)    RDW 17.5 (*)    Platelets 24 (*)    Lymphs Abs 0.1 (*)    All other components within normal limits  COMPREHENSIVE METABOLIC PANEL WITH GFR - Abnormal; Notable for the following components:   Sodium 134 (*)    CO2 18 (*)    Glucose, Bld 135 (*)    BUN 28 (*)    Creatinine, Ser 1.56 (*)    Calcium  8.7 (*)    Albumin  3.0 (*)    Total Bilirubin 3.3 (*)    GFR, Estimated 36 (*)    All other components within normal limits  BRAIN NATRIURETIC PEPTIDE -  Abnormal; Notable for the following components:   B Natriuretic Peptide 292.4 (*)    All other components within normal limits  TROPONIN I (HIGH SENSITIVITY) - Abnormal; Notable for the following components:   Troponin I (High Sensitivity) 30 (*)  All other components within normal limits  TROPONIN I (HIGH SENSITIVITY) - Abnormal; Notable for the following components:   Troponin I (High Sensitivity) 44 (*)    All other components within normal limits  DIGOXIN  LEVEL  URINALYSIS, W/ REFLEX TO CULTURE (INFECTION SUSPECTED)  TYPE AND SCREEN    EKG: None ED ECG REPORT   Date: 07/24/2024  Rate: 120   Rhythm: atrial fibrillation  QRS Axis: left  Intervals: normal  ST/T Wave abnormalities: normal  Conduction Disutrbances:left anterior fascicular block  Narrative Interpretation:   Old EKG Reviewed: unchanged  I have personally reviewed the EKG tracing and agree with the computerized printout as noted.  Radiology: CT ABDOMEN PELVIS W CONTRAST Result Date: 07/24/2024 EXAM: CT ABDOMEN AND PELVIS WITH CONTRAST 07/24/2024 04:30:19 AM TECHNIQUE: CT of the abdomen and pelvis was performed with the administration of 75 mL of iohexol  (OMNIPAQUE ) 350 MG/ML injection. Multiplanar reformatted images are provided for review. Automated exposure control, iterative reconstruction, and/or weight-based adjustment of the mA/kV was utilized to reduce the radiation dose to as low as reasonably achievable. COMPARISON: CTA chest 07/24/2024 (reported separately). CT abdomen and pelvis 11/24/2023. CLINICAL HISTORY: 68 year old female. Shortness of breath, chest pain, abdominal pain. FINDINGS: LOWER CHEST: Small chronic pleural effusions as reported on the chest CTA separately, these are stable along with cardiomegaly since 11/24/2023. LIVER: Cirrhotic liver. Highly nodular liver contour with no discrete liver lesion identified. Small volume ascites is present adjacent to the liver. GALLBLADDER AND BILE DUCTS:  Chronic cholecystectomy. No biliary ductal dilatation. SPLEEN: Massive splenomegaly without focal lesion (estimated volume 2169 mL vsnormal splenic volume range 83 - 412 mL). No discrete splenic lesion. Chronic embolization coils in the left upper quadrant, reportedly previous embolized gastrorenal shunt. Small volume ascites is present adjacent to the spleen. PANCREAS: No acute abnormality. ADRENAL GLANDS: No acute abnormality. KIDNEYS, URETERS AND BLADDER: No stones in the kidneys or ureters. No hydronephrosis. No perinephric or periureteral stranding. Diminutive bladder. GI AND BOWEL: Diminutive stomach. Nondilated large and small bowel loops. Chronic postoperative changes to the right colon are stable. No pneumoperitoneum identified. PERITONEUM AND RETROPERITONEUM: Small volume ascites in the abdomen, most conspicuous adjacent to the liver and spleen, is new since 11/24/2023. Small volume of ascites in the lower abdomen and at the pelvic inlet also. No free air. VASCULATURE: Bulky paraesophageal and gastrohepatic venous varices persist (such as on series 5 image 29). Suboptimal intravascular contrast but the major vascular structures in the abdomen and pelvis appear to be enhancing and patent, including bulky varices in the upper abdomen and also the right lower quadrant. Calcified aortic atherosclerosis. Aorta is normal in caliber. There is chronic cavernous transformation of the main portal vein. LYMPH NODES: No lymphadenopathy. REPRODUCTIVE ORGANS: Stable IUD, positioning somewhat oblique as before. BONES AND SOFT TISSUES: Lower thoracic spine hyperostosis and intermittent ankylosis. No acute or suspicious osseous lesion. Large abdominal panniculus with confluent increased subcutaneous soft tissue density and stranding laterally on the left (series 5 image 73). Underlying generalized mild body wall edema elsewhere appears more symmetric. No soft tissue gas, organized or drainable fluid collection identified  in the involved abdominal wall. Presacral edema is chronic but increased. IMPRESSION: 1. Cirrhosis with bulky chronic varices in the abdomen (including paraesophageal). New or increased small-volume ascites since March. 2. Large abdominal panniculus with confluent increased subcutaneous soft tissue density and stranding laterally on the left (series 5 image 73). Whereas elsewhere generalized body wall edema appears more symmetric. Query abdominal wall Cellulitis vs hematoma /  contusion. No organized or drainable fluid collection. 3. Small chronic pleural effusions, CTA chest today reported separately. Electronically signed by: Helayne Hurst MD 07/24/2024 04:50 AM EST RP Workstation: [id number]   CT Angio Chest Pulmonary Embolism (PE) W or WO Contrast Result Date: 07/24/2024 EXAM: LUNG CANCER SCREENING CHEST CT WITH AND WITHOUT CONTRAST 07/24/2024 04:30:19 AM TECHNIQUE: Low-dose CT of the chest was performed with and without the administration of intravenous contrast. 75 mL of iohexol  (OMNIPAQUE ) 350 MG/ML injection was administered. Multiplanar reformatted images are provided for review. Automated exposure control, iterative reconstruction, and/or weight based adjustment of the mA/kV was utilized to reduce the radiation dose to as low as reasonably achievable. COMPARISON: CTA chest 01/10/2021. CLINICAL HISTORY: 68 year old female. Screening. FINDINGS: MEDIASTINUM: Heart and pericardium: Chronic cardiomegaly is moderate to severe. No pericardial effusion. Vessels: Aberrant origin of the right subclavian artery, normal variant. Calcified aortic atherosclerosis. Suboptimal but adequate central pulmonary artery contrast timing. However, respiratory motion limits evaluation of the bilateral pulmonary segmental and distal branches. No central or lobar pulmonary artery filling defect. Distal branches are not well evaluated. Airways: The central airways are clear. LYMPH NODES: No mediastinal, hilar or axillary  lymphadenopathy. LUNGS AND PLEURA: Lungs: Mild respiratory motion. Generalized mild to moderate atelectatic changes of the major airways which remain patent. Bilateral lower lobe compressive atelectasis, similar to prior exam. Mosaic lung attenuation elsewhere, likely due to combined mild atelectasis and gas trapping. No focal consolidation or pulmonary edema. No suspicious pulmonary nodules. Pleura: Small layering pleural effusions, mildly decreased compared to the prior exam. No pneumothorax. SOFT TISSUES AND BONES: Bones: Thoracic spine hyperostosis with flowing endplate osteophytes resulting in intermittent interbody ankylosis. Soft tissues: Benign appearing rim calcified cyst in the right breast on series 3 image 104 is incidental. UPPER ABDOMEN: CT abdomen and pelvis today is reported separately. IMPRESSION: 1. No central or lobar pulmonary embolism identified; distal branches are limited by motion and contrast timing. 2. Small bilateral pleural effusions, pulmonary atelectasis. Pronounced chronic cardiomegaly. Aortic atherosclerosis. 3. No acute or inflammatory process identified in the chest. 4. CT abdomen and pelvis reported separately. Electronically signed by: Helayne Hurst MD 07/24/2024 04:38 AM EST RP Workstation: [id number]   DG Chest Port 1 View Result Date: 07/24/2024 EXAM: 1 VIEW(S) XRAY OF THE CHEST 07/24/2024 01:28:00 AM COMPARISON: 04/21/2024 CLINICAL HISTORY: short of breath, palpitations FINDINGS: LUNGS AND PLEURA: Left lower lobe opacity, likely atelectasis. Vascular congestion. No pleural effusion. No pneumothorax. HEART AND MEDIASTINUM: Cardiomegaly. Aortic atherosclerosis. BONES AND SOFT TISSUES: No acute osseous abnormality. IMPRESSION: 1. Cardiomegaly and vascular congestion. 2. Left lower lobe opacity, likely atelectasis. 3. Aortic atherosclerosis. Electronically signed by: Franky Crease MD 07/24/2024 01:35 AM EST RP Workstation: [id number]     Procedures   Medications Ordered in  the ED  midodrine  (PROAMATINE ) tablet 15 mg (has no administration in time range)  cefTRIAXone  (ROCEPHIN ) 1 g in sodium chloride  0.9 % 100 mL IVPB (has no administration in time range)  oxyCODONE -acetaminophen  (PERCOCET/ROXICET) 5-325 MG per tablet 0.5 tablet (0.5 tablets Oral Given 07/24/24 0202)  sodium chloride  0.9 % bolus 500 mL (0 mLs Intravenous Stopped 07/24/24 0442)  metoprolol  tartrate (LOPRESSOR ) injection 5 mg (5 mg Intravenous Given 07/24/24 0432)  iohexol  (OMNIPAQUE ) 350 MG/ML injection 75 mL (75 mLs Intravenous Contrast Given 07/24/24 0423)  digoxin  (LANOXIN ) 0.25 MG/ML injection 0.25 mg (0.25 mg Intravenous Given 07/24/24 0451)  Medical Decision Making Amount and/or Complexity of Data Reviewed External Data Reviewed: labs, radiology, ECG and notes. Labs: ordered. Decision-making details documented in ED Course. Radiology: ordered and independent interpretation performed. Decision-making details documented in ED Course. ECG/medicine tests: ordered and independent interpretation performed. Decision-making details documented in ED Course.  Risk Prescription drug management.   Differential Diagnosis considered includes, but not limited to: Cholelithiasis; cholecystitis; cholangitis; bowel obstruction; esophagitis; gastritis; peptic ulcer disease; pancreatitis; cardiac.  Patient is complaining of diffuse abdominal pain.  She does have a history of cirrhosis.  No hematemesis or rectal bleeding.  She comes to the emergency department by ambulance with significant tachycardia.  Patient has a history of permanent atrial fibrillation.  She reports that she was previously maintained on Cardizem  but currently is on metoprolol .  She generally has good rate control.  She felt her heart rate go up tonight associated with the diffuse abdominal pain.  EMS administered 2 boluses of Cardizem  10 mg IV during transport.  At arrival she is still very tachycardic,  reasonably normotensive.  Patient complaining of pain in her neck, shoulders and diffusely in the abdomen, possibly chest area.  As her blood pressure runs low and it was borderline and did not want to give much analgesia and artificially drop her blood pressure.  She cannot take fentanyl .  Was given half a Percocet tablet with some improvement.  As it seemed that she tolerated the Cardizem  boluses during transport, she was started on a Cardizem  drip without additional bolus.  Unfortunately this did not improve her heart rate at all, however blood pressures did become soft.  Cardizem  was stopped.  At this point the remainder of blood work was back and it appears that she is euvolemic, no signs of decompensated heart failure.  She does have a history of heart failure with preserved ejection fraction, 60 to 65%.  Given a fluid bolus.  Patient also reports a history of thrombocytopenia and chronic GI bleeding requiring recurrent transfusions.  Most recently she was transfused this week.  Hemoglobin is 7.7, which is not a significant drop from where she was after her transfusion.  Anemia is not the cause of the tachycardia.  Additional consideration for rate control is that the patient is not anticoagulated because of her thrombocytopenia and chronic GI blood loss.  There would be risk to cardioversion.  DC cardioversion was not considered.  Amiodarone  was considered a possible treatment to get rate control.  This was discussed with Dr. Gail, on-call for cardiology.  He felt that the patient should get boluses of Lopressor  to see if heart rate improved.  The next up after that would be IV digoxin .  Patient given IV Lopressor  with some dropping of the blood pressure but no change in rate control.  At this time dig level came back at 0.9.  Patient was therefore only given 0.25 mg IV, awaiting results.  Patient did undergo CT PE study without any acute findings.  Patient additionally had CT abdomen and  pelvis which revealed some ascites but no other acute findings.  Doubt SBP but can give a dose of Rocephin  empirically.  CRITICAL CARE Performed by: Lonni JINNY Seats   Total critical care time: 40 minutes  Critical care time was exclusive of separately billable procedures and treating other patients.  Critical care was necessary to treat or prevent imminent or life-threatening deterioration.  Critical care was time spent personally by me on the following activities: development of treatment plan with patient and/or surrogate  as well as nursing, discussions with consultants, evaluation of patient's response to treatment, examination of patient, obtaining history from patient or surrogate, ordering and performing treatments and interventions, ordering and review of laboratory studies, ordering and review of radiographic studies, pulse oximetry and re-evaluation of patient's condition.      Final diagnoses:  Chronic atrial fibrillation with RVR (HCC)  Generalized abdominal pain    ED Discharge Orders     None          Haze Lonni PARAS, MD 07/24/24 [phone]

## 2024-07-24 NOTE — Consult Note (Signed)
 Cardiology Consultation   Patient ID: KAYE LUOMA MRN: [id number]; DOB: 03/13/1956  Admit date: 07/24/2024 Date of Consult: 07/24/2024  PCP:  Regino Slater, MD    [facility] Providers Cardiologist:  Annabella Scarce, MD        Patient Profile: Hannah Khan is a 68 y.o. female with a hx of permanent fibrillation not on anticoagulation, HFpEF s/p TIPS on 12/2022, NASH cirrhosis, chronic thrombocytopenia, CKD stage IIIb, morbid obesity, prediabetes , aortic stenosis and chronic hypotension who is being seen 07/24/2024 for the evaluation of atrial fibrillation at the request of the emergency department.  History of Present Illness: Hannah Khan presented with severe onset right upper quadrant abdominal pain that radiates to the back.  Her symptoms started on the evening prior to her ER visit.  She also noticed that she had a rapid heart rates.  This prompted an ER visit.  She denies fevers, chills.   Patient has a history of atrial fibrillation that was diagnosed since at least 2022.  Fortunately, management of her AF has been limited by her cirrhosis complicated by thrombocytopenia and anemia.  She takes metoprolol  to tartrate 25 mg twice daily as well as digoxin  0.125 mg once daily.  He has a CHADS2 Vascor of 3, however she is not on oral anticoagulation.  She also has chronic hypotension that limits up titration of her beta-blocker.  She has  heart failure with preserved ejection fraction.  She is currently on torsemide  milligrams in the morning and 40 mg at night, spironolactone  75 mg once daily (she states she has had 80 pound weight loss).  She has previously's seen EP in 2021  And AV nodal ablation and permanent pacemaker were discussed, however, after some discussion it was decided to continue medical management   Past Medical History:  Diagnosis Date   Anemia    hx of   Aortic stenosis 02/2023   Echo 03/07/23 LVEF 55-60%, mild to moderate AS   Arthritis    knees    CHF (congestive heart failure) (HCC)    chronic diastolic   Diabetes mellitus without complication (HCC)    type 2   Dyspnea    Dysrhythmia    Fibromyalgia    Gastric varices    large   H/O transfusion of platelets    Heart murmur    never has caused any problems   Hx of colonic polyps    s/p partial colectomy   Hyperlipidemia    Hypertension    Joint pain    in knees and back spasms   Left leg swelling    wear compression hose   Liver cirrhosis secondary to NASH (nonalcoholic steatohepatitis) (HCC) dx nov 2014   had enlarged spleen also    Morbid obesity (HCC)    Persistent atrial fibrillation (HCC)    Pneumonia 10/2016   x 5   PONV (postoperative nausea and vomiting)    in past none recent   Portal hypertension (HCC)    Sleep apnea    uses cpap, setting varies between 14-20   Spleen enlarged    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     Past Surgical History:  Procedure Laterality Date   BREAST LUMPECTOMY WITH RADIOACTIVE SEED LOCALIZATION Right 08/29/2017   Procedure: RIGHT BREAST LUMPECTOMY WITH RADIOACTIVE SEEDS LOCALIZATION;  Surgeon: Vanderbilt Ned, MD;  Location: [facility];  Service: General;  Laterality: Right;   CESAREAN SECTION  1989   CHOLECYSTECTOMY  20 yrs ago   COLECTOMY  2011  COLONOSCOPY     COLONOSCOPY N/A 09/05/2018   Procedure: COLONOSCOPY;  Surgeon: Burnette Fallow, MD;  Location: [facility];  Service: Endoscopy;  Laterality: N/A;   COLONOSCOPY WITH PROPOFOL  Bilateral 05/04/2022   Procedure: COLONOSCOPY WITH PROPOFOL ;  Surgeon: Burnette Fallow, MD;  Location: [facility];  Service: Gastroenterology;  Laterality: Bilateral;   DILATATION & CURETTAGE/HYSTEROSCOPY WITH MYOSURE N/A 05/06/2016   Procedure: DILATATION & CURETTAGE/HYSTEROSCOPY WITH MYOSURE WITH POLYPECTOMY;  Surgeon: Delon Prude, DO;  Location: [facility];  Service: Gynecology;  Laterality: N/A;   ESOPHAGOGASTRODUODENOSCOPY (EGD) WITH PROPOFOL  N/A 09/04/2013   Procedure:  ESOPHAGOGASTRODUODENOSCOPY (EGD) WITH PROPOFOL ;  Surgeon: Fallow Burnette, MD;  Location: [facility];  Service: Endoscopy;  Laterality: N/A;   ESOPHAGOGASTRODUODENOSCOPY (EGD) WITH PROPOFOL  Bilateral 05/04/2022   Procedure: ESOPHAGOGASTRODUODENOSCOPY (EGD) WITH PROPOFOL ;  Surgeon: Burnette Fallow, MD;  Location: [facility];  Service: Gastroenterology;  Laterality: Bilateral;   IR EMBO ART  VEN HEMORR LYMPH EXTRAV  INC GUIDE ROADMAPPING  06/30/2023   IR INTRAVASCULAR ULTRASOUND NON CORONARY  12/23/2022   IR RADIOLOGIST EVAL & MGMT  09/21/2022   IR RADIOLOGIST EVAL & MGMT  10/06/2022   IR RADIOLOGIST EVAL & MGMT  11/02/2022   IR RADIOLOGIST EVAL & MGMT  01/31/2023   IR RADIOLOGIST EVAL & MGMT  05/31/2023   IR RADIOLOGIST EVAL & MGMT  08/21/2023   IR RADIOLOGIST EVAL & MGMT  12/25/2023   IR TIPS  12/23/2022   IR TIPS  06/30/2023   IR US  GUIDE VASC ACCESS LEFT  06/30/2023   IR US  GUIDE VASC ACCESS RIGHT  12/23/2022   IR US  GUIDE VASC ACCESS RIGHT  06/30/2023   IR US  GUIDE VASC ACCESS RIGHT  06/30/2023   KNEE ARTHROSCOPY  yrs ago   bilateral, one done x 1, one done twice   POLYPECTOMY  09/05/2018   Procedure: POLYPECTOMY;  Surgeon: Burnette Fallow, MD;  Location: [facility];  Service: Endoscopy;;   RADIOLOGY WITH ANESTHESIA N/A 12/23/2022   Procedure: TIPS;  Surgeon: Jennefer Ester PARAS, MD;  Location: [facility];  Service: Radiology;  Laterality: N/A;   RIGHT HEART CATH N/A 05/19/2023   Procedure: RIGHT HEART CATH;  Surgeon: Gardenia Led, DO;  Location: [facility];  Service: Cardiovascular;  Laterality: N/A;   TIPS PROCEDURE N/A 06/30/2023   Procedure: TRANS-JUGULAR INTRAHEPATIC PORTAL SHUNT (TIPS);  Surgeon: Jennefer Ester PARAS, MD;  Location[facility] OR;  Service: Radiology;  Laterality: N/A;     Home Medications:  Prior to Admission medications   Medication Sig Start Date End Date Taking? Authorizing Provider  albuterol  (VENTOLIN  HFA) 108 (90 Base) MCG/ACT inhaler Inhale 1-2 puffs into the lungs every 6  (six) hours as needed for wheezing or shortness of breath.    [provider]  digoxin  (LANOXIN ) 0.125 MG tablet TAKE 1 TABLET BY MOUTH EVERY DAY 04/17/24   Sabharwal, Aditya, DO  lactulose  (CHRONULAC ) 10 GM/15ML solution Take 20 g by mouth 3 (three) times daily. 03/17/21   [provider]  methocarbamol  (ROBAXIN ) 750 MG tablet Take 1 tablet (750 mg total) by mouth every 8 (eight) hours as needed for muscle spasms. 06/13/24   Darci Pore, MD  metolazone  (ZAROXOLYN ) 2.5 MG tablet Take 1 tablet (2.5 mg total) by mouth as needed. 05/29/24   Glena Harlene HERO, FNP  metoprolol  tartrate (LOPRESSOR ) 50 MG tablet Take 1/2 tablet by mouth in the a.m and 1 tablet by mouth in the p.m 05/01/24   Swinyer, Rosaline HERO, NP  midodrine  (PROAMATINE ) 10 MG tablet Take  1.5 tablets (15 mg total) by mouth 3 (three) times daily. 05/29/24   Milford, Harlene HERO, FNP  Multiple Vitamins-Minerals (PRESERVISION AREDS 2) CAPS Take 1 capsule by mouth daily.    [provider]  pantoprazole  (PROTONIX ) 40 MG tablet Take 40 mg by mouth 2 (two) times daily. 11/17/23   [provider]  potassium chloride  (MICRO-K ) 10 MEQ CR capsule Take 4 capsules (40 mEq total) by mouth 2 (two) times daily. Take an extra 4 tablets when taking Metolazone . 04/24/24   Jillian Buttery, MD  Propylene Glycol (SYSTANE BALANCE) 0.6 % SOLN Place 1 drop into both eyes 2 (two) times daily.    [provider]  spironolactone  (ALDACTONE ) 25 MG tablet TAKE 3 TABLETS BY MOUTH DAILY 03/05/24   Sabharwal, Aditya, DO  torsemide  (DEMADEX ) 20 MG tablet Take 4 tablets (80 mg total) by mouth in the morning and 2 tablets (40 mg total) in the evening 04/24/24   Jillian Buttery, MD  TRESIBA  FLEXTOUCH 100 UNIT/ML FlexTouch Pen Inject 36 Units into the skin daily. (TITRATE WHEN DIRECTED UP TO 50 UNITS) 01/22/24   [provider]  XIFAXAN  550 MG TABS tablet Take 550 mg by mouth 2 (two) times daily. 03/13/21   [provider]     Scheduled Meds:  Continuous Infusions:  sodium chloride      PRN Meds: metoprolol  tartrate  Allergies:    Allergies  Allergen Reactions   Celebrex [Celecoxib] Other (See Comments)    speech slurred with celebrex   Shellfish Allergy Swelling, Rash and Other (See Comments)    TINGLING AND SWELLING OF LIPS. LARGE WHELPS, QUARTER-SIZE   Tomato Anaphylaxis    Pt cannot swallow due to blisters that form inside throat resulting in swelling.    Barium-Containing Compounds Other (See Comments)    TACHYCARDIA   Fentanyl  Other (See Comments)    Hallucinations    Nsaids Other (See Comments)    Contraindication due to CKD stage 3    Prednisone Other (See Comments)    TACHYCARDIA   Sglt2 Inhibitors Other (See Comments)    Ozempic , Severe gastroparesis    Statins Other (See Comments)    AGGRAVATED FIBROMYALGIA   Tylenol  [Acetaminophen ] Other (See Comments)    UNSPECIFIED SPECIFIC REACTION >> DUE TO PLATELETS   Ibuprofen  Other (See Comments)    UNSPECIFIED SPECIFIC REACTION >> DUE TO PLATELETS   Tramadol  Other (See Comments)    Hallucination    Zetia [Ezetimibe] Other (See Comments)    Headache    Social History:   Social History   Socioeconomic History   Marital status: Married    Spouse name: Not on file   Number of children: Not on file   Years of education: Not on file   Highest education level: Not on file  Occupational History   Occupation: Scientist, Product/process Development  Tobacco Use   Smoking status: Former    Current packs/day: 0.00    Average packs/day: 1 pack/day for 3.0 years (3.0 ttl pk-yrs)    Types: Cigarettes    Start date: 09/20/1971    Quit date: 09/19/1974    Years since quitting: 49.8   Smokeless tobacco: Never  Vaping Use   Vaping status: Never Used  Substance and Sexual Activity   Alcohol  use: Not Currently    Comment: occ   Drug use: No   Sexual activity: Not on file  Other Topics Concern   Not on file  Social History Narrative   Lives in [place] with her  husband.  Instructor for early childhood development.     Social Drivers of Corporate Investment Banker Strain: Not on file  Food Insecurity: No Food Insecurity (07/17/2024)   Hunger Vital Sign    Worried About Running Out of Food in the Last Year: Never true    Ran Out of Food in the Last Year: Never true  Transportation Needs: No Transportation Needs (07/17/2024)   PRAPARE - Administrator, Civil Service (Medical): No    Lack of Transportation (Non-Medical): No  Physical Activity: Not on file  Stress: Not on file  Social Connections: Socially Integrated (06/12/2024)   Social Connection and Isolation Panel    Frequency of Communication with Friends and Family: More than three times a week    Frequency of Social Gatherings with Friends and Family: More than three times a week    Attends Religious Services: 1 to 4 times per year    Active Member of [employer] or Organizations: No    Attends Engineer, Structural: 1 to 4 times per year    Marital Status: Married  Catering Manager Violence: Not At Risk (07/17/2024)   Humiliation, Afraid, Rape, and Kick questionnaire    Fear of Current or Ex-Partner: No    Emotionally Abused: No    Physically Abused: No    Sexually Abused: No    Family History:    Family History  Problem Relation Age of Onset   Heart failure Mother    Cancer Mother    Hyperlipidemia Mother    Congestive Heart Failure Mother    Stroke Mother    Lung cancer Father    Cancer Father    Cancer Brother    Hyperlipidemia Brother    Stroke Other    Breast cancer Neg Hx      ROS:  Please see the history of present illness.   All other ROS reviewed and negative.     Physical Exam/Data: Vitals:   07/24/24 0250 07/24/24 0300 07/24/24 0310 07/24/24 0320  BP: (!) 109/96 (!) 94/53 (!) 85/46 (!) 87/63  Pulse: (!) 196 (!) 26 (!) 173 (!) 176  Resp: (!) 25 (!) 26 (!) 23 19  Temp:      TempSrc:      SpO2: 97% 99% 97% 97%  Weight:       Height:        Intake/Output Summary (Last 24 hours) at 07/24/2024 0337 Last data filed at 07/24/2024 9687 Gross per 24 hour  Intake 7.99 ml  Output --  Net 7.99 ml      07/24/2024    1:07 AM 07/17/2024    3:40 PM 06/13/2024    5:54 AM  Last 3 Weights  Weight (lbs) 286 lb 9.6 oz 285 lb 14.4 oz 290 lb 9.6 oz  Weight (kg) 130 kg 129.683 kg 131.815 kg     Body mass index is 44.89 kg/m.  General:  in moderate distress HEENT: normal Neck: no JVD Vascular: No carotid bruits; Distal pulses 2+ bilaterally Cardiac:  irregular rate, rhythm Lungs:  clear to auscultation bilaterally, no wheezing, rhonchi or rales  Abd: soft, nontender, no hepatomegaly  Ext: no edema Musculoskeletal:  No deformities, BUE and BLE strength normal and equal Skin: warm and dry, hyperpigmented in the bilateral distal legs Neuro:  CNs 2-12 intact, no focal abnormalities noted Psych:  Normal affect   EKG:  The EKG was personally reviewed and demonstrates:  AF Telemetry:  Telemetry was personally reviewed and  demonstrates:  AF with RVR  Relevant CV Studies:  reviewed  Laboratory Data: High Sensitivity Troponin:   Recent Labs  Lab 07/24/24 0125  TROPONINIHS 30*     Chemistry Recent Labs  Lab 07/24/24 0125  NA 134*  K 4.0  CL 102  CO2 18*  GLUCOSE 135*  BUN 28*  CREATININE 1.56*  CALCIUM  8.7*  GFRNONAA 36*  ANIONGAP 14    Recent Labs  Lab 07/24/24 0125  PROT 6.8  ALBUMIN  3.0*  AST 29  ALT 17  ALKPHOS 72  BILITOT 3.3*   Lipids No results for input(s): CHOL, TRIG, HDL, LABVLDL, LDLCALC, CHOLHDL in the last 168 hours.  Hematology Recent Labs  Lab 07/17/24 1616 07/24/24 0125  WBC 2.7* 4.2  RBC 2.54* 2.83*  HGB 6.8* 7.7*  HCT 22.0* 25.7*  MCV 86.6 90.8  MCH 26.8 27.2  MCHC 30.9 30.0  RDW 17.5* 17.5*  PLT 25* 24*   Thyroid  No results for input(s): TSH, FREET4 in the last 168 hours.  BNP Recent Labs  Lab 07/24/24 0125  BNP 292.4*    DDimer No results for  input(s): DDIMER in the last 168 hours.  Radiology/Studies:  DG Chest Port 1 View Result Date: 07/24/2024 EXAM: 1 VIEW(S) XRAY OF THE CHEST 07/24/2024 01:28:00 AM COMPARISON: 04/21/2024 CLINICAL HISTORY: short of breath, palpitations FINDINGS: LUNGS AND PLEURA: Left lower lobe opacity, likely atelectasis. Vascular congestion. No pleural effusion. No pneumothorax. HEART AND MEDIASTINUM: Cardiomegaly. Aortic atherosclerosis. BONES AND SOFT TISSUES: No acute osseous abnormality. IMPRESSION: 1. Cardiomegaly and vascular congestion. 2. Left lower lobe opacity, likely atelectasis. 3. Aortic atherosclerosis. Electronically signed by: Franky Crease MD 07/24/2024 01:35 AM EST RP Workstation: [id number]     Assessment and Plan:  Hannah Khan is a 68 y.o. female with a hx of permanent fibrillation not on anticoagulation, HFpEF s/p TIPS on 12/2022, NASH cirrhosis, chronic thrombocytopenia, CKD stage IIIb, morbid obesity, prediabetes , aortic stenosis and chronic hypotension who is being seen 07/24/2024 for AF with RVR.  Remains in AF with ventricular rates in the 170s.  proBNP 292.  Patient's atrial fibrillation with rapid ventricular rates is likely exacerbated by her acute abdominal pain.  She is currently undergoing workup per the ER.  unfortunately, patient has limited options given her permanent AF and inability to use chronic anticoagulation.  Minimally responsive to diltiazem  drip in the ER and she became hypotensive with systolics in the 80s.  Would trial IV digoxin  as well as oral metoprolol  tartrate.    Recommendations -Continue workup of abdominal pain per primary team -Aggressive management of pain control per primary team -IV digoxin  0.25-0.5mg  -spot dose PO metoprolol  tartrate 12.5mg   -goal K>4, Mg>2  Risk Assessment/Risk Scores:         CHA2DS2-VASc Score =     This indicates a  % annual risk of stroke. [name] score is based upon:         For questions or updates, please  contact [facility] Please consult [email or web address] for contact info under      Signed, Thia Olesen A Zarah Carbon, MD  07/24/2024 3:37 AM

## 2024-07-24 NOTE — Progress Notes (Signed)
 Pharmacy Antibiotic Note  Hannah Khan is a 68 y.o. female admitted on 07/24/2024 with IA Infection.  Pharmacy has been consulted for Zosyn  dosing.  Plan: Patient is a 68 year old female with a pmh of decompensated NASH cirrhosis who presented with abdominal pain. Pharmacy is consulted for Zosyn  for IA infection.  Height: 5' 7 (170.2 cm) Weight: 130 kg (286 lb 9.6 oz) IBW/kg (Calculated) : 61.6  Temp (24hrs), Avg:98.6 F (37 C), Min:98.5 F (36.9 C), Max:98.7 F (37.1 C)  Recent Labs  Lab 07/17/24 1616 07/24/24 0125  WBC 2.7* 4.2  CREATININE  --  1.56*    Estimated Creatinine Clearance: 48.5 mL/min (A) (by C-G formula based on SCr of 1.56 mg/dL (H)).    Allergies  Allergen Reactions   Celebrex [Celecoxib] Other (See Comments)    speech slurred with celebrex   Shellfish Allergy Swelling, Rash and Other (See Comments)    TINGLING AND SWELLING OF LIPS. LARGE WHELPS, QUARTER-SIZE   Tomato Anaphylaxis    Pt cannot swallow due to blisters that form inside throat resulting in swelling.    Barium-Containing Compounds Other (See Comments)    TACHYCARDIA   Fentanyl  Other (See Comments)    Hallucinations    Nsaids Other (See Comments)    Contraindication due to CKD stage 3    Prednisone Other (See Comments)    TACHYCARDIA   Sglt2 Inhibitors Other (See Comments)    Ozempic , Severe gastroparesis    Statins Other (See Comments)    AGGRAVATED FIBROMYALGIA   Tylenol  [Acetaminophen ] Other (See Comments)    UNSPECIFIED SPECIFIC REACTION >> DUE TO PLATELETS   Ibuprofen  Other (See Comments)    UNSPECIFIED SPECIFIC REACTION >> DUE TO PLATELETS   Tramadol  Other (See Comments)    Hallucination    Zetia [Ezetimibe] Other (See Comments)    Headache    Microbiology results: none  Thank you for allowing pharmacy to be a part of this patient's care.  Rakwon Letourneau M Kym Scannell 07/24/2024 1:21 PM

## 2024-07-24 NOTE — Progress Notes (Signed)
  IR BRIEF PROGRESS NOTE:   I was present during limited US  of the abdomen to evaluate for ascites. There is minimal peritoneal fluid with numerous mobile loops of bowel. CT Abdomen Pelvis w/ CM today noted small volume ascites as well.   Dr. Jenna verified presence of minimal fluid, precluding safe window for paracentesis. He recommended deferring paracentesis, especially in light of platelet count of 24 K.   Paracentesis deferred.    Electronically Signed: Carlin DELENA Griffon, PA-C 07/24/2024, 11:06 AM

## 2024-07-24 NOTE — Progress Notes (Signed)
 Brief cardiology note:  Recommendations from Dr. Gail reviewed. Telemetry shows gradual reduction in heart rate. We are in a difficult position; she is not anticoagulated, so risk of conversion with amiodarone  not inconsequential, and she also has a history of cirrhosis, so would need to be cautious. Blood pressure did not tolerated diltiazem . Was on oral digoxin  as an outpatient. Has been started on fractionated metoprolol  and tolerating.  Would aim for permissive rate control (ideally less than 110 on average but with her comorbid conditions, expect she may run higher than this. Would preferentially use metoprolol  q6 as long as blood pressure allows. Ok to use IV digoxin .   Expect that heart rate control will improve as other conditions return to their baseline.  We will continue to follow.  Shelda Bruckner, MD, PhD, [facility]  [facility]  [place]  [facility] [address], [address] [place], [place] [address] [phone]

## 2024-07-24 NOTE — H&P (Addendum)
 History and Physical    Patient: Hannah Khan FMW:[id number] DOB: [birth year] DOA: 07/24/2024 DOS: the patient was seen and examined on 07/24/2024 PCP: Regino Slater, MD  Patient coming from: Home  Chief Complaint:  Chief Complaint  Patient presents with   Tachycardia   HPI: Hannah Khan is a 68 y.o. female with medical history significant of severe morbid obesity, NASH cirrhosis, esophageal and gastric varices, history of GI bleed, persistent A-fib not on anticoagulation, chronic HFpEF (EF 60-65% in 04/2204), OSA, CKD 3B, anemia of chronic disease, type 2 diabetes p/w AFRVR iso HFpEF exacerbation and abominal pain.  The patient presented with abdominal pain that started last night around eight o'clock. The pain was severe, preventing the patient from sitting comfortably, even in a recliner. The patient tried to alleviate the pain by resting on the left side, suspecting it might be gas-related, but experienced no relief and did not pass gas initially. The patient also reported shortness of breath, which occurred after the onset of abdominal pain, prompting an ambulance call. The patient did not take any pain medication  to relieve the pain due to liver issues. Upon ambulance arrival, the patient's atrial fibrillation (A fib) had exacerbated, with a heart rate between 140 to 200 beats per minute. The patient experienced some relief of her abd pain after passing gas, but continued to have elevated HR. The patient reported no similar pain in the past. Additionally, the patient described ongoing neck and shoulder pain from a workers' compensation injury in 2019.  In the ED, pt tachycardic, and tachypneic w/ hypoxia (on 21% FiO2). Labs notable for Cr 1.56, BNP 292, and troponin 30-->44. EDP consulted Cards and requested medicine admission.    Review of Systems: As mentioned in the history of present illness. All other systems reviewed and are negative. Past Medical History:  Diagnosis Date    Anemia    hx of   Aortic stenosis 02/2023   Echo 03/07/23 LVEF 55-60%, mild to moderate AS   Arthritis    knees   CHF (congestive heart failure) (HCC)    chronic diastolic   Diabetes mellitus without complication (HCC)    type 2   Dyspnea    Dysrhythmia    Fibromyalgia    Gastric varices    large   H/O transfusion of platelets    Heart murmur    never has caused any problems   Hx of colonic polyps    s/p partial colectomy   Hyperlipidemia    Hypertension    Joint pain    in knees and back spasms   Left leg swelling    wear compression hose   Liver cirrhosis secondary to NASH (nonalcoholic steatohepatitis) (HCC) dx nov 2014   had enlarged spleen also    Morbid obesity (HCC)    Persistent atrial fibrillation (HCC)    Pneumonia 10/2016   x 5   PONV (postoperative nausea and vomiting)    in past none recent   Portal hypertension (HCC)    Sleep apnea    uses cpap, setting varies between 14-20   Spleen enlarged    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    Past Surgical History:  Procedure Laterality Date   BREAST LUMPECTOMY WITH RADIOACTIVE SEED LOCALIZATION Right 08/29/2017   Procedure: RIGHT BREAST LUMPECTOMY WITH RADIOACTIVE SEEDS LOCALIZATION;  Surgeon: Vanderbilt Ned, MD;  Location: [facility];  Service: General;  Laterality: Right;   CESAREAN SECTION  1989   CHOLECYSTECTOMY  20 yrs ago  COLECTOMY  2011   COLONOSCOPY     COLONOSCOPY N/A 09/05/2018   Procedure: COLONOSCOPY;  Surgeon: Burnette Fallow, MD;  Location: [facility];  Service: Endoscopy;  Laterality: N/A;   COLONOSCOPY WITH PROPOFOL  Bilateral 05/04/2022   Procedure: COLONOSCOPY WITH PROPOFOL ;  Surgeon: Burnette Fallow, MD;  Location: [facility];  Service: Gastroenterology;  Laterality: Bilateral;   DILATATION & CURETTAGE/HYSTEROSCOPY WITH MYOSURE N/A 05/06/2016   Procedure: DILATATION & CURETTAGE/HYSTEROSCOPY WITH MYOSURE WITH POLYPECTOMY;  Surgeon: Delon Prude, DO;  Location: [facility];  Service: Gynecology;   Laterality: N/A;   ESOPHAGOGASTRODUODENOSCOPY (EGD) WITH PROPOFOL  N/A 09/04/2013   Procedure: ESOPHAGOGASTRODUODENOSCOPY (EGD) WITH PROPOFOL ;  Surgeon: Fallow Burnette, MD;  Location: [facility];  Service: Endoscopy;  Laterality: N/A;   ESOPHAGOGASTRODUODENOSCOPY (EGD) WITH PROPOFOL  Bilateral 05/04/2022   Procedure: ESOPHAGOGASTRODUODENOSCOPY (EGD) WITH PROPOFOL ;  Surgeon: Burnette Fallow, MD;  Location: [facility];  Service: Gastroenterology;  Laterality: Bilateral;   IR EMBO ART  VEN HEMORR LYMPH EXTRAV  INC GUIDE ROADMAPPING  06/30/2023   IR INTRAVASCULAR ULTRASOUND NON CORONARY  12/23/2022   IR RADIOLOGIST EVAL & MGMT  09/21/2022   IR RADIOLOGIST EVAL & MGMT  10/06/2022   IR RADIOLOGIST EVAL & MGMT  11/02/2022   IR RADIOLOGIST EVAL & MGMT  01/31/2023   IR RADIOLOGIST EVAL & MGMT  05/31/2023   IR RADIOLOGIST EVAL & MGMT  08/21/2023   IR RADIOLOGIST EVAL & MGMT  12/25/2023   IR TIPS  12/23/2022   IR TIPS  06/30/2023   IR US  GUIDE VASC ACCESS LEFT  06/30/2023   IR US  GUIDE VASC ACCESS RIGHT  12/23/2022   IR US  GUIDE VASC ACCESS RIGHT  06/30/2023   IR US  GUIDE VASC ACCESS RIGHT  06/30/2023   KNEE ARTHROSCOPY  yrs ago   bilateral, one done x 1, one done twice   POLYPECTOMY  09/05/2018   Procedure: POLYPECTOMY;  Surgeon: Burnette Fallow, MD;  Location: [facility];  Service: Endoscopy;;   RADIOLOGY WITH ANESTHESIA N/A 12/23/2022   Procedure: TIPS;  Surgeon: Jennefer Ester PARAS, MD;  Location: [facility];  Service: Radiology;  Laterality: N/A;   RIGHT HEART CATH N/A 05/19/2023   Procedure: RIGHT HEART CATH;  Surgeon: Gardenia Led, DO;  Location: [facility];  Service: Cardiovascular;  Laterality: N/A;   TIPS PROCEDURE N/A 06/30/2023   Procedure: TRANS-JUGULAR INTRAHEPATIC PORTAL SHUNT (TIPS);  Surgeon: Jennefer Ester PARAS, MD;  Location: [facility] OR;  Service: Radiology;  Laterality: N/A;   Social History:  reports that she quit smoking about 49 years ago. Her smoking use included cigarettes. She started  smoking about 52 years ago. She has a 3 pack-year smoking history. She has never used smokeless tobacco. She reports that she does not currently use alcohol . She reports that she does not use drugs.  Allergies  Allergen Reactions   Celebrex [Celecoxib] Other (See Comments)    speech slurred with celebrex   Shellfish Allergy Swelling, Rash and Other (See Comments)    TINGLING AND SWELLING OF LIPS. LARGE WHELPS, QUARTER-SIZE   Tomato Anaphylaxis    Pt cannot swallow due to blisters that form inside throat resulting in swelling.    Barium-Containing Compounds Other (See Comments)    TACHYCARDIA   Fentanyl  Other (See Comments)    Hallucinations    Nsaids Other (See Comments)    Contraindication due to CKD stage 3    Prednisone Other (See Comments)    TACHYCARDIA   Sglt2 Inhibitors Other (See Comments)    Ozempic , Severe gastroparesis  Statins Other (See Comments)    AGGRAVATED FIBROMYALGIA   Tylenol  [Acetaminophen ] Other (See Comments)    UNSPECIFIED SPECIFIC REACTION >> DUE TO PLATELETS   Ibuprofen  Other (See Comments)    UNSPECIFIED SPECIFIC REACTION >> DUE TO PLATELETS   Tramadol  Other (See Comments)    Hallucination    Zetia [Ezetimibe] Other (See Comments)    Headache    Family History  Problem Relation Age of Onset   Heart failure Mother    Cancer Mother    Hyperlipidemia Mother    Congestive Heart Failure Mother    Stroke Mother    Lung cancer Father    Cancer Father    Cancer Brother    Hyperlipidemia Brother    Stroke Other    Breast cancer Neg Hx     Prior to Admission medications   Medication Sig Start Date End Date Taking? Authorizing Provider  albuterol  (VENTOLIN  HFA) 108 (90 Base) MCG/ACT inhaler Inhale 1-2 puffs into the lungs every 6 (six) hours as needed for wheezing or shortness of breath.    [provider]  digoxin  (LANOXIN ) 0.125 MG tablet TAKE 1 TABLET BY MOUTH EVERY DAY 04/17/24   Sabharwal, Aditya, DO  lactulose  (CHRONULAC ) 10  GM/15ML solution Take 20 g by mouth 3 (three) times daily. 03/17/21   [provider]  methocarbamol  (ROBAXIN ) 750 MG tablet Take 1 tablet (750 mg total) by mouth every 8 (eight) hours as needed for muscle spasms. 06/13/24   Darci Pore, MD  metolazone  (ZAROXOLYN ) 2.5 MG tablet Take 1 tablet (2.5 mg total) by mouth as needed. 05/29/24   Glena Harlene HERO, FNP  metoprolol  tartrate (LOPRESSOR ) 50 MG tablet Take 1/2 tablet by mouth in the a.m and 1 tablet by mouth in the p.m 05/01/24   Swinyer, Rosaline HERO, NP  midodrine  (PROAMATINE ) 10 MG tablet Take 1.5 tablets (15 mg total) by mouth 3 (three) times daily. 05/29/24   Milford, Harlene HERO, FNP  Multiple Vitamins-Minerals (PRESERVISION AREDS 2) CAPS Take 1 capsule by mouth daily.    [provider]  pantoprazole  (PROTONIX ) 40 MG tablet Take 40 mg by mouth 2 (two) times daily. 11/17/23   [provider]  potassium chloride  (MICRO-K ) 10 MEQ CR capsule Take 4 capsules (40 mEq total) by mouth 2 (two) times daily. Take an extra 4 tablets when taking Metolazone . 04/24/24   Jillian Buttery, MD  Propylene Glycol (SYSTANE BALANCE) 0.6 % SOLN Place 1 drop into both eyes 2 (two) times daily.    [provider]  spironolactone  (ALDACTONE ) 25 MG tablet TAKE 3 TABLETS BY MOUTH DAILY 03/05/24   Sabharwal, Aditya, DO  torsemide  (DEMADEX ) 20 MG tablet Take 4 tablets (80 mg total) by mouth in the morning and 2 tablets (40 mg total) in the evening 04/24/24   Jillian Buttery, MD  TRESIBA  FLEXTOUCH 100 UNIT/ML FlexTouch Pen Inject 36 Units into the skin daily. (TITRATE WHEN DIRECTED UP TO 50 UNITS) 01/22/24   [provider]  XIFAXAN  550 MG TABS tablet Take 550 mg by mouth 2 (two) times daily. 03/13/21   [provider]    Physical Exam: Vitals:   07/24/24 0630 07/24/24 0640 07/24/24 0650 07/24/24 0732  BP: 135/62 (!) 121/59 128/60   Pulse: (!) 121 (!) 106 (!) 117   Resp: 19 20 16    Temp:    98.7 F (37.1 C)  TempSrc:       SpO2: 98% 100% 97%   Weight:      Height:  General: Alert, oriented x3, resting comfortably in no acute distress Respiratory: Lungs clear to auscultation bilaterally with normal respiratory effort; no w/r/r Abdomen: Soft, TTP diffusely, and mild distension. Positive bowel sounds   Data Reviewed:  Lab Results  Component Value Date   WBC 4.2 07/24/2024   HGB 7.7 (L) 07/24/2024   HCT 25.7 (L) 07/24/2024   MCV 90.8 07/24/2024   PLT 24 (LL) 07/24/2024   Lab Results  Component Value Date   GLUCOSE 135 (H) 07/24/2024   CALCIUM  8.7 (L) 07/24/2024   NA 134 (L) 07/24/2024   K 4.0 07/24/2024   CO2 18 (L) 07/24/2024   CL 102 07/24/2024   BUN 28 (H) 07/24/2024   CREATININE 1.56 (H) 07/24/2024   Lab Results  Component Value Date   ALT 17 07/24/2024   AST 29 07/24/2024   ALKPHOS 72 07/24/2024   BILITOT 3.3 (H) 07/24/2024   Lab Results  Component Value Date   INR 1.3 (H) 06/10/2024   INR 1.3 (H) 04/21/2024   INR 1.4 (H) 04/19/2024   Radiology: IR ABDOMEN US  LIMITED Result Date: 07/24/2024 CLINICAL DATA:  The patient with history of NASH cirrhosis, presenting to ED with abdominal discomfort. CT AP w/ CM today demonstrating small volume ascites. IR was requested for diagnostic and therapeutic paracentesis. Patient with history of GI bleed requiring transfusions, and thrombocytopenia. Platelets 24 K today. EXAM: LIMITED ABDOMEN ULTRASOUND FOR ASCITES TECHNIQUE: Limited ultrasound survey for ascites was performed in all four abdominal quadrants. COMPARISON:  CT AP w/ CM today (07/24/24). FINDINGS: Small volume ascites with no safe window to proceed in the setting of severe thrombocytopenia. IMPRESSION: Paracentesis deferred due to small volume ascites without safe window for paracentesis, in the setting of severe thrombocytopenia. Electronically Signed   By: Cordella Banner   On: 07/24/2024 11:28   CT ABDOMEN PELVIS W CONTRAST Result Date: 07/24/2024 EXAM: CT ABDOMEN AND PELVIS  WITH CONTRAST 07/24/2024 04:30:19 AM TECHNIQUE: CT of the abdomen and pelvis was performed with the administration of 75 mL of iohexol  (OMNIPAQUE ) 350 MG/ML injection. Multiplanar reformatted images are provided for review. Automated exposure control, iterative reconstruction, and/or weight-based adjustment of the mA/kV was utilized to reduce the radiation dose to as low as reasonably achievable. COMPARISON: CTA chest 07/24/2024 (reported separately). CT abdomen and pelvis 11/24/2023. CLINICAL HISTORY: 68 year old female. Shortness of breath, chest pain, abdominal pain. FINDINGS: LOWER CHEST: Small chronic pleural effusions as reported on the chest CTA separately, these are stable along with cardiomegaly since 11/24/2023. LIVER: Cirrhotic liver. Highly nodular liver contour with no discrete liver lesion identified. Small volume ascites is present adjacent to the liver. GALLBLADDER AND BILE DUCTS: Chronic cholecystectomy. No biliary ductal dilatation. SPLEEN: Massive splenomegaly without focal lesion (estimated volume 2169 mL vsnormal splenic volume range 83 - 412 mL). No discrete splenic lesion. Chronic embolization coils in the left upper quadrant, reportedly previous embolized gastrorenal shunt. Small volume ascites is present adjacent to the spleen. PANCREAS: No acute abnormality. ADRENAL GLANDS: No acute abnormality. KIDNEYS, URETERS AND BLADDER: No stones in the kidneys or ureters. No hydronephrosis. No perinephric or periureteral stranding. Diminutive bladder. GI AND BOWEL: Diminutive stomach. Nondilated large and small bowel loops. Chronic postoperative changes to the right colon are stable. No pneumoperitoneum identified. PERITONEUM AND RETROPERITONEUM: Small volume ascites in the abdomen, most conspicuous adjacent to the liver and spleen, is new since 11/24/2023. Small volume of ascites in the lower abdomen and at the pelvic inlet also. No free air. VASCULATURE: Bulky paraesophageal  and gastrohepatic venous  varices persist (such as on series 5 image 29). Suboptimal intravascular contrast but the major vascular structures in the abdomen and pelvis appear to be enhancing and patent, including bulky varices in the upper abdomen and also the right lower quadrant. Calcified aortic atherosclerosis. Aorta is normal in caliber. There is chronic cavernous transformation of the main portal vein. LYMPH NODES: No lymphadenopathy. REPRODUCTIVE ORGANS: Stable IUD, positioning somewhat oblique as before. BONES AND SOFT TISSUES: Lower thoracic spine hyperostosis and intermittent ankylosis. No acute or suspicious osseous lesion. Large abdominal panniculus with confluent increased subcutaneous soft tissue density and stranding laterally on the left (series 5 image 73). Underlying generalized mild body wall edema elsewhere appears more symmetric. No soft tissue gas, organized or drainable fluid collection identified in the involved abdominal wall. Presacral edema is chronic but increased. IMPRESSION: 1. Cirrhosis with bulky chronic varices in the abdomen (including paraesophageal). New or increased small-volume ascites since March. 2. Large abdominal panniculus with confluent increased subcutaneous soft tissue density and stranding laterally on the left (series 5 image 73). Whereas elsewhere generalized body wall edema appears more symmetric. Query abdominal wall Cellulitis vs hematoma / contusion. No organized or drainable fluid collection. 3. Small chronic pleural effusions, CTA chest today reported separately. Electronically signed by: Helayne Hurst MD 07/24/2024 04:50 AM EST RP Workstation: [id number]   CT Angio Chest Pulmonary Embolism (PE) W or WO Contrast Result Date: 07/24/2024 EXAM: LUNG CANCER SCREENING CHEST CT WITH AND WITHOUT CONTRAST 07/24/2024 04:30:19 AM TECHNIQUE: Low-dose CT of the chest was performed with and without the administration of intravenous contrast. 75 mL of iohexol  (OMNIPAQUE ) 350 MG/ML injection was  administered. Multiplanar reformatted images are provided for review. Automated exposure control, iterative reconstruction, and/or weight based adjustment of the mA/kV was utilized to reduce the radiation dose to as low as reasonably achievable. COMPARISON: CTA chest 01/10/2021. CLINICAL HISTORY: 68 year old female. Screening. FINDINGS: MEDIASTINUM: Heart and pericardium: Chronic cardiomegaly is moderate to severe. No pericardial effusion. Vessels: Aberrant origin of the right subclavian artery, normal variant. Calcified aortic atherosclerosis. Suboptimal but adequate central pulmonary artery contrast timing. However, respiratory motion limits evaluation of the bilateral pulmonary segmental and distal branches. No central or lobar pulmonary artery filling defect. Distal branches are not well evaluated. Airways: The central airways are clear. LYMPH NODES: No mediastinal, hilar or axillary lymphadenopathy. LUNGS AND PLEURA: Lungs: Mild respiratory motion. Generalized mild to moderate atelectatic changes of the major airways which remain patent. Bilateral lower lobe compressive atelectasis, similar to prior exam. Mosaic lung attenuation elsewhere, likely due to combined mild atelectasis and gas trapping. No focal consolidation or pulmonary edema. No suspicious pulmonary nodules. Pleura: Small layering pleural effusions, mildly decreased compared to the prior exam. No pneumothorax. SOFT TISSUES AND BONES: Bones: Thoracic spine hyperostosis with flowing endplate osteophytes resulting in intermittent interbody ankylosis. Soft tissues: Benign appearing rim calcified cyst in the right breast on series 3 image 104 is incidental. UPPER ABDOMEN: CT abdomen and pelvis today is reported separately. IMPRESSION: 1. No central or lobar pulmonary embolism identified; distal branches are limited by motion and contrast timing. 2. Small bilateral pleural effusions, pulmonary atelectasis. Pronounced chronic cardiomegaly. Aortic  atherosclerosis. 3. No acute or inflammatory process identified in the chest. 4. CT abdomen and pelvis reported separately. Electronically signed by: Helayne Hurst MD 07/24/2024 04:38 AM EST RP Workstation: [id number]   DG Chest Port 1 View Result Date: 07/24/2024 EXAM: 1 VIEW(S) XRAY OF THE CHEST 07/24/2024 01:28:00 AM COMPARISON: 04/21/2024 CLINICAL HISTORY:  short of breath, palpitations FINDINGS: LUNGS AND PLEURA: Left lower lobe opacity, likely atelectasis. Vascular congestion. No pleural effusion. No pneumothorax. HEART AND MEDIASTINUM: Cardiomegaly. Aortic atherosclerosis. BONES AND SOFT TISSUES: No acute osseous abnormality. IMPRESSION: 1. Cardiomegaly and vascular congestion. 2. Left lower lobe opacity, likely atelectasis. 3. Aortic atherosclerosis. Electronically signed by: Franky Crease MD 07/24/2024 01:35 AM EST RP Workstation: [id number]    Assessment and Plan: 5F h/o severe morbid obesity, NASH cirrhosis, esophageal and gastric varices, s/p TIPS on 12/2022, history of GI bleed, persistent A-fib not on anticoagulation, chronic HFpEF (EF 60-65% in 04/2204), OSA, CKD 3B, anemia of chronic disease, type 2 diabetes p/w AFRVR iso HFpEF exacerbation and abominal pain.  AFRVR Elevated HR iso volume overload below -Cards consulted; apprec eval/recs (recs IV digoxin  0.25-0.5mg  w/ spot dosing of metoprolol  12.5mg )  HFpEF exacerbation Elevated BNP -RD consulted for low sodium diet; apprec eval/recs -IV lasix  80mg  BID for now -HOLD pta metoprolol  for now; may require spot dosing as needed per Cards consult -HOLD pta spironolactone  for now  Cirrhosis Mild ascites on imaging and IR deferred paracentesis -IR consulted and deferred paracentesis (see note) -IV diuresis per above -HOLD pta spironolactone  -PTA lactulose  and rifaximin   Abnormal abdominal imaging c/f abdominal wall cellulitis Procalcitonin >5; CT abdomen w/ large abdominal panniculus with confluent increased subcutaneous soft tissue  density and stranding laterally on the left (series 5 image 73). Whereas elsewhere generalized body wall edema appears more symmetric. Query abdominal wall Cellulitis vs hematoma / contusion. No organized or drainable fluid collection. -IV Zosyn  per pharmacy protocol for now given elevated procalcitonin   Advance Care Planning:   Code Status: Full Code   Consults: N/A  Family Communication: Spouse  Severity of Illness: The appropriate patient status for this patient is INPATIENT. Inpatient status is judged to be reasonable and necessary in order to provide the required intensity of service to ensure the patient's safety. [name] presenting symptoms, physical exam findings, and initial radiographic and laboratory data in the context of their chronic comorbidities is felt to place them at high risk for further clinical deterioration. Furthermore, it is not anticipated that the patient will be medically stable for discharge from the hospital within 2 midnights of admission.   * I certify that at the point of admission it is my clinical judgment that the patient will require inpatient hospital care spanning beyond 2 midnights from the point of admission due to high intensity of service, high risk for further deterioration and high frequency of surveillance required.*   ------- I spent 55 minutes reviewing previous notes, at the bedside counseling/discussing the treatment plan, and performing clinical documentation.  Author: Marsha Ada, MD 07/24/2024 7:39 AM  For on call review [email or web address].

## 2024-07-25 DIAGNOSIS — I4891 Unspecified atrial fibrillation: Secondary | ICD-10-CM | POA: Diagnosis not present

## 2024-07-25 DIAGNOSIS — R1084 Generalized abdominal pain: Secondary | ICD-10-CM | POA: Diagnosis not present

## 2024-07-25 DIAGNOSIS — I5032 Chronic diastolic (congestive) heart failure: Secondary | ICD-10-CM | POA: Diagnosis not present

## 2024-07-25 LAB — CBC
HCT: 23.2 % — ABNORMAL LOW (ref 36.0–46.0)
Hemoglobin: 7.1 g/dL — ABNORMAL LOW (ref 12.0–15.0)
MCH: 27.4 pg (ref 26.0–34.0)
MCHC: 30.6 g/dL (ref 30.0–36.0)
MCV: 89.6 fL (ref 80.0–100.0)
Platelets: 20 K/uL — CL (ref 150–400)
RBC: 2.59 MIL/uL — ABNORMAL LOW (ref 3.87–5.11)
RDW: 17.9 % — ABNORMAL HIGH (ref 11.5–15.5)
WBC: 2.7 K/uL — ABNORMAL LOW (ref 4.0–10.5)
nRBC: 0 % (ref 0.0–0.2)

## 2024-07-25 LAB — BASIC METABOLIC PANEL WITH GFR
Anion gap: 14 (ref 5–15)
BUN: 30 mg/dL — ABNORMAL HIGH (ref 8–23)
CO2: 20 mmol/L — ABNORMAL LOW (ref 22–32)
Calcium: 8.5 mg/dL — ABNORMAL LOW (ref 8.9–10.3)
Chloride: 103 mmol/L (ref 98–111)
Creatinine, Ser: 1.73 mg/dL — ABNORMAL HIGH (ref 0.44–1.00)
GFR, Estimated: 32 mL/min — ABNORMAL LOW (ref >60)
Glucose, Bld: 131 mg/dL — ABNORMAL HIGH (ref 70–99)
Potassium: 3.6 mmol/L (ref 3.5–5.1)
Sodium: 137 mmol/L (ref 135–145)

## 2024-07-25 MED ORDER — POTASSIUM CHLORIDE 20 MEQ PO PACK
40.0000 meq | PACK | Freq: Once | ORAL | Status: AC
  Administered 2024-07-25: 40 meq via ORAL
  Filled 2024-07-25: qty 2

## 2024-07-25 MED ORDER — TORSEMIDE 20 MG PO TABS: 120.0000 mg | ORAL_TABLET | Freq: Every day | ORAL | Status: DC

## 2024-07-25 MED ORDER — MIDODRINE HCL 5 MG PO TABS
10.0000 mg | ORAL_TABLET | Freq: Two times a day (BID) | ORAL | Status: DC
  Administered 2024-07-25 – 2024-07-28 (×7): 10 mg via ORAL
  Filled 2024-07-25 (×7): qty 2

## 2024-07-25 MED ORDER — TORSEMIDE 20 MG PO TABS
40.0000 mg | ORAL_TABLET | Freq: Every evening | ORAL | Status: DC
  Administered 2024-07-25 – 2024-07-28 (×4): 40 mg via ORAL
  Filled 2024-07-25 (×4): qty 2

## 2024-07-25 MED ORDER — TORSEMIDE 20 MG PO TABS
80.0000 mg | ORAL_TABLET | Freq: Every day | ORAL | Status: DC
  Administered 2024-07-25 – 2024-07-29 (×5): 80 mg via ORAL
  Filled 2024-07-25 (×5): qty 4

## 2024-07-25 MED ORDER — DIGOXIN 125 MCG PO TABS
0.1250 mg | ORAL_TABLET | Freq: Every day | ORAL | Status: DC
  Administered 2024-07-25 – 2024-07-29 (×5): 0.125 mg via ORAL
  Filled 2024-07-25 (×5): qty 1

## 2024-07-25 MED ORDER — METOPROLOL TARTRATE 25 MG PO TABS
25.0000 mg | ORAL_TABLET | Freq: Two times a day (BID) | ORAL | Status: DC
  Administered 2024-07-25 – 2024-07-29 (×8): 25 mg via ORAL
  Filled 2024-07-25 (×8): qty 1

## 2024-07-25 NOTE — Plan of Care (Signed)

## 2024-07-25 NOTE — Progress Notes (Addendum)
 Progress Note  Patient Name: Hannah Khan Date of Encounter: 07/25/2024 [facility] Cardiologist: Annabella Scarce, MD   Interval Summary    Reports that abdominal discomfort has improved after having a bowel movement and passing gas.  Denies chest pain, shortness of breath, worsening orthopnea.  Denies missing any home doses of metoprolol  or digoxin .  Vital Signs Vitals:   07/24/24 1935 07/25/24 0304 07/25/24 0700 07/25/24 0745  BP:  (!) 104/51 (!) 88/39 (!) 88/39  Pulse: (!) 113 (!) 105    Resp: 19  14   Temp: 98 F (36.7 C)  98.3 F (36.8 C)   TempSrc: Oral  Oral   SpO2: 96%     Weight:      Height:        Intake/Output Summary (Last 24 hours) at 07/25/2024 0758 Last data filed at 07/25/2024 0738 Gross per 24 hour  Intake 240 ml  Output 950 ml  Net -710 ml      07/24/2024    3:59 PM 07/24/2024    1:07 AM 07/17/2024    3:40 PM  Last 3 Weights  Weight (lbs) 289 lb 286 lb 9.6 oz 285 lb 14.4 oz  Weight (kg) 131.09 kg 130 kg 129.683 kg      Telemetry/ECG  Atrial fibrillation with ventricular rates in the 100s to 110s.  Ventricular rates have improved from 160s to 170s on arrival.- Personally Reviewed  Physical Exam  GEN: No acute distress.  Alert and oriented on room air Neck: No JVD Cardiac: Irregularly irregular rhythm, 2/6 systolic murmur, rubs, or gallops.  Respiratory: Clear to auscultation bilaterally. GI: Soft, nontender, non-distended  MS: No edema  Assessment & Plan  Hannah Khan is a 68 y.o. female with a hx of type 2 diabetes, recurrent panniculitis, permanent fibrillation not on anticoagulation, HFpEF,  NASH cirrhosis, esophageal varices s/p TIPS on 12/2022, chronic thrombocytopenia, CKD stage IIIb, morbid obesity, prediabetes, aortic stenosis and chronic hypotension who is being seen 07/24/2024 for AF with RVR.   Permanent atrial fibrillation since 2017 CHA2DS2-VASc Score = 5 [CHF History: 1, HTN History: 1, Diabetes History: 0, Stroke  History: 0, Vascular Disease History: 1, Age Score: 1, Gender Score: 1].  Therefore, the patient's annual risk of stroke is 7.2 %.    Is not on anticoagulant because of chronic thrombocytopenia, cirrhosis and esophageal varices.  Platelets were 24 when checked this hospitalization. Presented to the emergency department for abdominal pain and racing heart rate.  En route to the ER EMS administered IV Cardizem .  After arriving at the hospital was started on metoprolol  and IV digoxin .  Heart rates are currently in the 100s to 110s.  May consider restarting digoxin  if rate control worsens.  Potassium 3.6, magnesium  2.0.  Ordered potassium replacement for goal potassium greater than 4.  Increased rates are likely provoked by anemia and possible infection. continue metoprolol  12.5 mg every 6 hours.   HFpEF Moderate aortic stenosis TTE 04/2024 showed a normal LVEF of 60 to 65%, no RWMA, mildly reduced RV systolic function, mildly elevated pulmonary artery systolic pressure, moderately dilated left atrium, mildly dilated right atrium, moderate aortic calcification, and moderate aortic valve stenosis. Chest x-ray showed Left lower lobe opacity, likely atelectasis. Vascular congestion. No pleural effusion. No pneumothorax. BNP elevated at 292.4. Was started on 80 mg IV Lasix  twice daily.  Weight at last visit with the heart failure clinic was 282 pounds.  Weight yesterday was 289 pounds.  Appears euvolemic on exam.  Creatinine  and BUN have increased slightly from yesterday. Stop IV Lasix  Restart home torsemide  80 mg in the morning and 40 mg in the evening. Restart home potassium 40mEq twice daily Restart spironolactone  if blood pressure stable   Aortic atherosclerosis Seen on CT Increases CHADS2VASC score   Elevated high-sensitivity troponins 30 >44 Suspect elevated troponins are secondary to demand ischemia from increased heart rates.   Hypotension Blood pressures have been soft this admission.   PTA was on midodrine  15 mg 3 times daily.  May consider restarting home midodrine .   Anemia Has had multiple episodes of anemia in the past.  On 07/17/2024 had a hemoglobin of 6.8 when checked by her hematologist.  Because of this she received 2 units of blood. Hemoglobin this hospitalization was 7.7.  Suspect is likely secondary to GI bleed.  Denies any melena, hematuria, or hematochezia.   NASH Cirrhosis s/p TIPS on 12/2022 Esophageal varices  Chronic thrombocytopenia Ascites Complicates management of patient's heart failure and atrial fibrillation.  IR did not drain the patient's ascites yesterday as abdominal echo did not find a significant volume Management per primary  Possible abdominal wall cellulitis On IV antibiotics, Management per primary     For questions or updates, please contact [facility] Please consult [email or web address] for contact info under        Signed, Jasiya Markie, PA-C

## 2024-07-25 NOTE — Progress Notes (Addendum)
 PROGRESS NOTE    Hannah Khan  FMW:[id number] DOB: 1956/08/04 DOA: 07/24/2024 PCP: Regino Slater, MD  68/F, chronically ill morbidly obese female with diastolic CHF, NASH/cirrhosis, esophageal varices with TIPS in 4/24, permanent A-fib not on anticoagulation, chronic thrombocytopenia, type 2 diabetes, CKD 3B chronic hypotension presented to the ED with abdominal discomfort, shortness of breath and A-fib RVR.  In the ED she was noted to be in A-fib with heart rate in the 130s, tachypneic, BNP 292, creatinine 1.5, troponin 30, 44 - Ultrasound only with small volume ascites, no safe window for paracentesis - CT abdomen pelvis with cirrhosis, bulky chronic varices, increased small volume ascites, abdominal pain [name] with soft tissue density, generalized body wall edema - CTA chest with no PE, small bilateral pleural effusions  Subjective: Feels better today, belly pain has resolved  Assessment and Plan:  Acute on chronic diastolic CHF Moderate aortic stenosis - Last echo 8/25 with EF 60-65, mildly reduced RV, mildly elevated PASP, moderate aortic stenosis - Clinically remains volume overloaded, continue IV Lasix  today - GDMT limited by hypotension, on midodrine  at baseline will resume - Aldactone  on hold  Atrial fibrillation with RVR - Not on anticoagulation secondary to thrombocytopenia/cirrhosis - Currently on metoprolol  and and digoxin , heart rate remains elevated, cards following  NASH/cirrhosis Esophageal varices - Decompensated with volume overload as noted above, - Only small volume ascites on ultrasound yesterday History of TIPS in 4/24  Acute on chronic anemia Iron deficiency anemia - Recently transfused PRBC about a week ago - Anemia panel with severe deficiency, repeat CBC pending, ordered she denies overt bleeding however has cirrhosis and bulky varices, continue PPI - Low threshold to involve GI, last EGD 8/23 noted small varices then  Abdominal  pain/discomfort - Suspect this is secondary to edema, now improving  AKI on CKD 3a - Likely cardiorenal as well as got contrast in the ER yesterday, monitor  ?  Abdominal wall cellulitis - CT mentioned increased soft tissue density and stranding, clinically does not have cellulitis, has a area of increased edema without signs of infection likely related to generalized body edema, procalcitonin was elevated yesterday - Will repeat, CBC in a.m., likely discontinue ABX tomorrow  DVT prophylaxis: SCDs Code Status: Full code Family Communication: None present Disposition Plan: Home pending above management  Consultants:    Procedures:   Antimicrobials:    Objective: Vitals:   07/25/24 0304 07/25/24 0700 07/25/24 0745 07/25/24 0810  BP: (!) 104/51 (!) 88/39 (!) 88/39 114/79  Pulse: (!) 105  100   Resp:  14 17   Temp:  98.3 F (36.8 C) 98 F (36.7 C)   TempSrc:  Oral Oral   SpO2:   99%   Weight:      Height:        Intake/Output Summary (Last 24 hours) at 07/25/2024 1007 Last data filed at 07/25/2024 0738 Gross per 24 hour  Intake 240 ml  Output 950 ml  Net -710 ml   Filed Weights   07/24/24 0107 07/24/24 1559  Weight: 130 kg 131.1 kg    Examination:  General exam: Appears calm and comfortable, AO x 3 Respiratory system: Decree breath sounds at the bases Cardiovascular system: S1 & S2 heard, RRR.  Abd: nondistended, soft and nontender.Normal bowel sounds heard.  Lower abdominal pannus with fluid Central nervous system: Alert and oriented. No focal neurological deficits. Extremities: 1+ edema Skin: No rashes Psychiatry:  Mood & affect appropriate.     Data Reviewed:  CBC: Recent Labs  Lab 07/24/24 0125  WBC 4.2  NEUTROABS 3.7  HGB 7.7*  HCT 25.7*  MCV 90.8  PLT 24*   Basic Metabolic Panel: Recent Labs  Lab 07/24/24 0125 07/25/24 0427  NA 134* 137  K 4.0 3.6  CL 102 103  CO2 18* 20*  GLUCOSE 135* 131*  BUN 28* 30*  CREATININE 1.56* 1.73*   CALCIUM  8.7* 8.5*   GFR: Estimated Creatinine Clearance: 43.9 mL/min (A) (by C-G formula based on SCr of 1.73 mg/dL (H)). Liver Function Tests: Recent Labs  Lab 07/24/24 0125  AST 29  ALT 17  ALKPHOS 72  BILITOT 3.3*  PROT 6.8  ALBUMIN  3.0*   Recent Labs  Lab 07/24/24 1813  LIPASE 45   No results for input(s): AMMONIA in the last 168 hours. Coagulation Profile: No results for input(s): INR, PROTIME in the last 168 hours. Cardiac Enzymes: No results for input(s): CKTOTAL, CKMB, CKMBINDEX, TROPONINI in the last 168 hours. BNP (last 3 results) Recent Labs    02/02/24 0900  PROBNP 1,655.0*   HbA1C: No results for input(s): HGBA1C in the last 72 hours. CBG: Recent Labs  Lab 07/24/24 1642  GLUCAP 185*   Lipid Profile: No results for input(s): CHOL, HDL, LDLCALC, TRIG, CHOLHDL, LDLDIRECT in the last 72 hours. Thyroid  Function Tests: No results for input(s): TSH, T4TOTAL, FREET4, T3FREE, THYROIDAB in the last 72 hours. Anemia Panel: No results for input(s): VITAMINB12, FOLATE, FERRITIN, TIBC, IRON, RETICCTPCT in the last 72 hours. Urine analysis:    Component Value Date/Time   COLORURINE YELLOW 07/24/2024 0610   APPEARANCEUR HAZY (A) 07/24/2024 0610   LABSPEC 1.027 07/24/2024 0610   PHURINE 5.0 07/24/2024 0610   GLUCOSEU NEGATIVE 07/24/2024 0610   HGBUR NEGATIVE 07/24/2024 0610   BILIRUBINUR NEGATIVE 07/24/2024 0610   KETONESUR NEGATIVE 07/24/2024 0610   PROTEINUR NEGATIVE 07/24/2024 0610   UROBILINOGEN 2.0 (H) 01/31/2015 0055   NITRITE NEGATIVE 07/24/2024 0610   LEUKOCYTESUR NEGATIVE 07/24/2024 0610   Sepsis Labs: @LABRCNTIP (procalcitonin:4,lacticidven:4)  )No results found for this or any previous visit (from the past 240 hours).   Radiology Studies: IR ABDOMEN US  LIMITED Result Date: 07/24/2024 CLINICAL DATA:  The patient with history of NASH cirrhosis, presenting to ED with abdominal discomfort. CT AP  w/ CM today demonstrating small volume ascites. IR was requested for diagnostic and therapeutic paracentesis. Patient with history of GI bleed requiring transfusions, and thrombocytopenia. Platelets 24 K today. EXAM: LIMITED ABDOMEN ULTRASOUND FOR ASCITES TECHNIQUE: Limited ultrasound survey for ascites was performed in all four abdominal quadrants. COMPARISON:  CT AP w/ CM today (07/24/24). FINDINGS: Small volume ascites with no safe window to proceed in the setting of severe thrombocytopenia. IMPRESSION: Paracentesis deferred due to small volume ascites without safe window for paracentesis, in the setting of severe thrombocytopenia. Electronically Signed   By: Cordella Banner   On: 07/24/2024 11:28   CT ABDOMEN PELVIS W CONTRAST Result Date: 07/24/2024 EXAM: CT ABDOMEN AND PELVIS WITH CONTRAST 07/24/2024 04:30:19 AM TECHNIQUE: CT of the abdomen and pelvis was performed with the administration of 75 mL of iohexol  (OMNIPAQUE ) 350 MG/ML injection. Multiplanar reformatted images are provided for review. Automated exposure control, iterative reconstruction, and/or weight-based adjustment of the mA/kV was utilized to reduce the radiation dose to as low as reasonably achievable. COMPARISON: CTA chest 07/24/2024 (reported separately). CT abdomen and pelvis 11/24/2023. CLINICAL HISTORY: 68 year old female. Shortness of breath, chest pain, abdominal pain. FINDINGS: LOWER CHEST: Small chronic pleural effusions as reported on  the chest CTA separately, these are stable along with cardiomegaly since 11/24/2023. LIVER: Cirrhotic liver. Highly nodular liver contour with no discrete liver lesion identified. Small volume ascites is present adjacent to the liver. GALLBLADDER AND BILE DUCTS: Chronic cholecystectomy. No biliary ductal dilatation. SPLEEN: Massive splenomegaly without focal lesion (estimated volume 2169 mL vsnormal splenic volume range 83 - 412 mL). No discrete splenic lesion. Chronic embolization coils in the left  upper quadrant, reportedly previous embolized gastrorenal shunt. Small volume ascites is present adjacent to the spleen. PANCREAS: No acute abnormality. ADRENAL GLANDS: No acute abnormality. KIDNEYS, URETERS AND BLADDER: No stones in the kidneys or ureters. No hydronephrosis. No perinephric or periureteral stranding. Diminutive bladder. GI AND BOWEL: Diminutive stomach. Nondilated large and small bowel loops. Chronic postoperative changes to the right colon are stable. No pneumoperitoneum identified. PERITONEUM AND RETROPERITONEUM: Small volume ascites in the abdomen, most conspicuous adjacent to the liver and spleen, is new since 11/24/2023. Small volume of ascites in the lower abdomen and at the pelvic inlet also. No free air. VASCULATURE: Bulky paraesophageal and gastrohepatic venous varices persist (such as on series 5 image 29). Suboptimal intravascular contrast but the major vascular structures in the abdomen and pelvis appear to be enhancing and patent, including bulky varices in the upper abdomen and also the right lower quadrant. Calcified aortic atherosclerosis. Aorta is normal in caliber. There is chronic cavernous transformation of the main portal vein. LYMPH NODES: No lymphadenopathy. REPRODUCTIVE ORGANS: Stable IUD, positioning somewhat oblique as before. BONES AND SOFT TISSUES: Lower thoracic spine hyperostosis and intermittent ankylosis. No acute or suspicious osseous lesion. Large abdominal panniculus with confluent increased subcutaneous soft tissue density and stranding laterally on the left (series 5 image 73). Underlying generalized mild body wall edema elsewhere appears more symmetric. No soft tissue gas, organized or drainable fluid collection identified in the involved abdominal wall. Presacral edema is chronic but increased. IMPRESSION: 1. Cirrhosis with bulky chronic varices in the abdomen (including paraesophageal). New or increased small-volume ascites since March. 2. Large abdominal  panniculus with confluent increased subcutaneous soft tissue density and stranding laterally on the left (series 5 image 73). Whereas elsewhere generalized body wall edema appears more symmetric. Query abdominal wall Cellulitis vs hematoma / contusion. No organized or drainable fluid collection. 3. Small chronic pleural effusions, CTA chest today reported separately. Electronically signed by: Helayne Hurst MD 07/24/2024 04:50 AM EST RP Workstation: [id number]   CT Angio Chest Pulmonary Embolism (PE) W or WO Contrast Result Date: 07/24/2024 EXAM: LUNG CANCER SCREENING CHEST CT WITH AND WITHOUT CONTRAST 07/24/2024 04:30:19 AM TECHNIQUE: Low-dose CT of the chest was performed with and without the administration of intravenous contrast. 75 mL of iohexol  (OMNIPAQUE ) 350 MG/ML injection was administered. Multiplanar reformatted images are provided for review. Automated exposure control, iterative reconstruction, and/or weight based adjustment of the mA/kV was utilized to reduce the radiation dose to as low as reasonably achievable. COMPARISON: CTA chest 01/10/2021. CLINICAL HISTORY: 68 year old female. Screening. FINDINGS: MEDIASTINUM: Heart and pericardium: Chronic cardiomegaly is moderate to severe. No pericardial effusion. Vessels: Aberrant origin of the right subclavian artery, normal variant. Calcified aortic atherosclerosis. Suboptimal but adequate central pulmonary artery contrast timing. However, respiratory motion limits evaluation of the bilateral pulmonary segmental and distal branches. No central or lobar pulmonary artery filling defect. Distal branches are not well evaluated. Airways: The central airways are clear. LYMPH NODES: No mediastinal, hilar or axillary lymphadenopathy. LUNGS AND PLEURA: Lungs: Mild respiratory motion. Generalized mild to moderate atelectatic changes of the  major airways which remain patent. Bilateral lower lobe compressive atelectasis, similar to prior exam. Mosaic lung attenuation  elsewhere, likely due to combined mild atelectasis and gas trapping. No focal consolidation or pulmonary edema. No suspicious pulmonary nodules. Pleura: Small layering pleural effusions, mildly decreased compared to the prior exam. No pneumothorax. SOFT TISSUES AND BONES: Bones: Thoracic spine hyperostosis with flowing endplate osteophytes resulting in intermittent interbody ankylosis. Soft tissues: Benign appearing rim calcified cyst in the right breast on series 3 image 104 is incidental. UPPER ABDOMEN: CT abdomen and pelvis today is reported separately. IMPRESSION: 1. No central or lobar pulmonary embolism identified; distal branches are limited by motion and contrast timing. 2. Small bilateral pleural effusions, pulmonary atelectasis. Pronounced chronic cardiomegaly. Aortic atherosclerosis. 3. No acute or inflammatory process identified in the chest. 4. CT abdomen and pelvis reported separately. Electronically signed by: Helayne Hurst MD 07/24/2024 04:38 AM EST RP Workstation: [id number]   DG Chest Port 1 View Result Date: 07/24/2024 EXAM: 1 VIEW(S) XRAY OF THE CHEST 07/24/2024 01:28:00 AM COMPARISON: 04/21/2024 CLINICAL HISTORY: short of breath, palpitations FINDINGS: LUNGS AND PLEURA: Left lower lobe opacity, likely atelectasis. Vascular congestion. No pleural effusion. No pneumothorax. HEART AND MEDIASTINUM: Cardiomegaly. Aortic atherosclerosis. BONES AND SOFT TISSUES: No acute osseous abnormality. IMPRESSION: 1. Cardiomegaly and vascular congestion. 2. Left lower lobe opacity, likely atelectasis. 3. Aortic atherosclerosis. Electronically signed by: Franky Crease MD 07/24/2024 01:35 AM EST RP Workstation: [id number]     Scheduled Meds:  lactulose   20 g Oral TID   lidocaine   20 mL Intradermal Once   metoprolol  tartrate  12.5 mg Oral Q6H   pantoprazole  (PROTONIX ) IV  40 mg Intravenous Q12H   potassium chloride   40 mEq Oral Once   rifaximin   550 mg Oral BID   torsemide   40 mg Oral QPM   torsemide   80  mg Oral Daily   Continuous Infusions:  piperacillin -tazobactam (ZOSYN )  IV 3.375 g (07/25/24 0520)     LOS: 1 day    Time spent:    Sigurd Pac, MD [facility]   07/25/2024, 10:07 AM

## 2024-07-25 NOTE — Plan of Care (Signed)
 Nutrition Education Note  RD consulted for nutrition education regarding CHF.  Pt reports familiarity with low sodium diet and her and her spouse follow guidelines at home. RD provided Low Sodium Nutrition Therapy handout from the [employer] for reference at discharge. No nutrition concerns or questions at this time.   Expect fair  compliance.  Body mass index is 45.26 kg/m. Pt meets criteria for obesity, class III  based on current BMI.  Labs and medications reviewed. No further nutrition interventions warranted at this time. RD contact information provided. If additional nutrition issues arise, please re-consult RD.   Gabrianna Fassnacht, MS, RD, LDN Clinical Dietitian  Contact via secure chat. If unavailable, use group chat RD Inpatient.

## 2024-07-26 DIAGNOSIS — R1084 Generalized abdominal pain: Secondary | ICD-10-CM

## 2024-07-26 DIAGNOSIS — I4891 Unspecified atrial fibrillation: Secondary | ICD-10-CM | POA: Diagnosis not present

## 2024-07-26 DIAGNOSIS — I5032 Chronic diastolic (congestive) heart failure: Secondary | ICD-10-CM

## 2024-07-26 LAB — CBC
HCT: 22.5 % — ABNORMAL LOW (ref 36.0–46.0)
Hemoglobin: 6.8 g/dL — CL (ref 12.0–15.0)
MCH: 27.2 pg (ref 26.0–34.0)
MCHC: 30.2 g/dL (ref 30.0–36.0)
MCV: 90 fL (ref 80.0–100.0)
Platelets: 25 K/uL — CL (ref 150–400)
RBC: 2.5 MIL/uL — ABNORMAL LOW (ref 3.87–5.11)
RDW: 17.8 % — ABNORMAL HIGH (ref 11.5–15.5)
WBC: 2.6 K/uL — ABNORMAL LOW (ref 4.0–10.5)
nRBC: 0 % (ref 0.0–0.2)

## 2024-07-26 LAB — HEMOGLOBIN AND HEMATOCRIT, BLOOD
HCT: 27.4 % — ABNORMAL LOW (ref 36.0–46.0)
Hemoglobin: 8.5 g/dL — ABNORMAL LOW (ref 12.0–15.0)

## 2024-07-26 LAB — COMPREHENSIVE METABOLIC PANEL WITH GFR
ALT: 17 U/L (ref 0–44)
AST: 36 U/L (ref 15–41)
Albumin: 2.6 g/dL — ABNORMAL LOW (ref 3.5–5.0)
Alkaline Phosphatase: 59 U/L (ref 38–126)
Anion gap: 13 (ref 5–15)
BUN: 30 mg/dL — ABNORMAL HIGH (ref 8–23)
CO2: 21 mmol/L — ABNORMAL LOW (ref 22–32)
Calcium: 8.3 mg/dL — ABNORMAL LOW (ref 8.9–10.3)
Chloride: 101 mmol/L (ref 98–111)
Creatinine, Ser: 1.74 mg/dL — ABNORMAL HIGH (ref 0.44–1.00)
GFR, Estimated: 32 mL/min — ABNORMAL LOW (ref >60)
Glucose, Bld: 134 mg/dL — ABNORMAL HIGH (ref 70–99)
Potassium: 4 mmol/L (ref 3.5–5.1)
Sodium: 135 mmol/L (ref 135–145)
Total Bilirubin: 2.9 mg/dL — ABNORMAL HIGH (ref 0.0–1.2)
Total Protein: 6.3 g/dL — ABNORMAL LOW (ref 6.5–8.1)

## 2024-07-26 LAB — PREPARE RBC (CROSSMATCH)

## 2024-07-26 MED ORDER — SODIUM CHLORIDE 0.9% IV SOLUTION: Freq: Once | INTRAVENOUS | Status: AC

## 2024-07-26 MED ORDER — SODIUM CHLORIDE 0.9 % IV SOLN
400.0000 mg | INTRAVENOUS | Status: AC
  Administered 2024-07-26 – 2024-07-27 (×2): 400 mg via INTRAVENOUS
  Filled 2024-07-26 (×2): qty 20

## 2024-07-26 MED ORDER — IRON SUCROSE 400 MG IVPB - SIMPLE MED
400.0000 mg | Status: DC
  Filled 2024-07-26: qty 270

## 2024-07-26 MED ORDER — LACTULOSE 10 GM/15ML PO SOLN
20.0000 g | Freq: Two times a day (BID) | ORAL | Status: DC
  Administered 2024-07-26 – 2024-07-29 (×5): 20 g via ORAL
  Filled 2024-07-26 (×6): qty 30

## 2024-07-26 NOTE — Progress Notes (Signed)
 Rx contacted as RN stopped zosyn  to administer blood, pt only has one line and is hard stick. Pt will get 2 units. Iv team consulted

## 2024-07-26 NOTE — Consult Note (Signed)
 [facility] Consultation Note  Referring Provider: [facility] Primary Care Physician:  Regino Slater, MD Primary Gastroenterologist:  Dr. Burnette  Reason for Consultation:  anemia  HPI: Hannah Khan is a 68 y.o. female admitted abdominal discomfort and weakness.  No hematemesis or blood in stool.  Has known gastric varices with attempted (failed) BRTO and another attempt by IR to resolve these, unsuccessful.  Her abdominal pain has improved.  She has had a couple recurrent admissions for transfusion-requiring anemia.  Last endoscopy and colonoscopy August 2023.   Past Medical History:  Diagnosis Date   Anemia    hx of   Aortic stenosis 02/2023   Echo 03/07/23 LVEF 55-60%, mild to moderate AS   Arthritis    knees   CHF (congestive heart failure) (HCC)    chronic diastolic   Diabetes mellitus without complication (HCC)    type 2   Dyspnea    Dysrhythmia    Fibromyalgia    Gastric varices    large   H/O transfusion of platelets    Heart murmur    never has caused any problems   Hx of colonic polyps    s/p partial colectomy   Hyperlipidemia    Hypertension    Joint pain    in knees and back spasms   Left leg swelling    wear compression hose   Liver cirrhosis secondary to NASH (nonalcoholic steatohepatitis) (HCC) dx nov 2014   had enlarged spleen also    Morbid obesity (HCC)    Persistent atrial fibrillation (HCC)    Pneumonia 10/2016   x 5   PONV (postoperative nausea and vomiting)    in past none recent   Portal hypertension (HCC)    Sleep apnea    uses cpap, setting varies between 14-20   Spleen enlarged    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     Past Surgical History:  Procedure Laterality Date   BREAST LUMPECTOMY WITH RADIOACTIVE SEED LOCALIZATION Right 08/29/2017   Procedure: RIGHT BREAST LUMPECTOMY WITH RADIOACTIVE SEEDS LOCALIZATION;  Surgeon: Vanderbilt Ned, MD;  Location: [facility] OR;  Service: General;  Laterality: Right;   CESAREAN  SECTION  1989   CHOLECYSTECTOMY  20 yrs ago   COLECTOMY  2011   COLONOSCOPY     COLONOSCOPY N/A 09/05/2018   Procedure: COLONOSCOPY;  Surgeon: Burnette Fallow, MD;  Location: [facility];  Service: Endoscopy;  Laterality: N/A;   COLONOSCOPY WITH PROPOFOL  Bilateral 05/04/2022   Procedure: COLONOSCOPY WITH PROPOFOL ;  Surgeon: Burnette Fallow, MD;  Location: [facility];  Service: Gastroenterology;  Laterality: Bilateral;   DILATATION & CURETTAGE/HYSTEROSCOPY WITH MYOSURE N/A 05/06/2016   Procedure: DILATATION & CURETTAGE/HYSTEROSCOPY WITH MYOSURE WITH POLYPECTOMY;  Surgeon: Delon Prude, DO;  Location: [facility];  Service: Gynecology;  Laterality: N/A;   ESOPHAGOGASTRODUODENOSCOPY (EGD) WITH PROPOFOL  N/A 09/04/2013   Procedure: ESOPHAGOGASTRODUODENOSCOPY (EGD) WITH PROPOFOL ;  Surgeon: Fallow Burnette, MD;  Location: [facility];  Service: Endoscopy;  Laterality: N/A;   ESOPHAGOGASTRODUODENOSCOPY (EGD) WITH PROPOFOL  Bilateral 05/04/2022   Procedure: ESOPHAGOGASTRODUODENOSCOPY (EGD) WITH PROPOFOL ;  Surgeon: Burnette Fallow, MD;  Location: [facility];  Service: Gastroenterology;  Laterality: Bilateral;   IR EMBO ART  VEN HEMORR LYMPH EXTRAV  INC GUIDE ROADMAPPING  06/30/2023   IR INTRAVASCULAR ULTRASOUND NON CORONARY  12/23/2022   IR RADIOLOGIST EVAL & MGMT  09/21/2022   IR RADIOLOGIST EVAL & MGMT  10/06/2022   IR RADIOLOGIST EVAL & MGMT  11/02/2022   IR RADIOLOGIST EVAL & MGMT  01/31/2023   IR  RADIOLOGIST EVAL & MGMT  05/31/2023   IR RADIOLOGIST EVAL & MGMT  08/21/2023   IR RADIOLOGIST EVAL & MGMT  12/25/2023   IR TIPS  12/23/2022   IR TIPS  06/30/2023   IR US  GUIDE VASC ACCESS LEFT  06/30/2023   IR US  GUIDE VASC ACCESS RIGHT  12/23/2022   IR US  GUIDE VASC ACCESS RIGHT  06/30/2023   IR US  GUIDE VASC ACCESS RIGHT  06/30/2023   KNEE ARTHROSCOPY  yrs ago   bilateral, one done x 1, one done twice   POLYPECTOMY  09/05/2018   Procedure: POLYPECTOMY;  Surgeon: Burnette Fallow, MD;  Location: [facility];   Service: Endoscopy;;   RADIOLOGY WITH ANESTHESIA N/A 12/23/2022   Procedure: TIPS;  Surgeon: Jennefer Ester PARAS, MD;  Location: [facility];  Service: Radiology;  Laterality: N/A;   RIGHT HEART CATH N/A 05/19/2023   Procedure: RIGHT HEART CATH;  Surgeon: Gardenia Led, DO;  Location: [facility];  Service: Cardiovascular;  Laterality: N/A;   TIPS PROCEDURE N/A 06/30/2023   Procedure: TRANS-JUGULAR INTRAHEPATIC PORTAL SHUNT (TIPS);  Surgeon: Jennefer Ester PARAS, MD;  Location: [facility] OR;  Service: Radiology;  Laterality: N/A;    Prior to Admission medications   Medication Sig Start Date End Date Taking? Authorizing Provider  albuterol  (VENTOLIN  HFA) 108 (90 Base) MCG/ACT inhaler Inhale 1-2 puffs into the lungs every 6 (six) hours as needed for wheezing or shortness of breath.   Yes [provider]  digoxin  (LANOXIN ) 0.125 MG tablet TAKE 1 TABLET BY MOUTH EVERY DAY 04/17/24  Yes Sabharwal, Aditya, DO  lactulose  (CHRONULAC ) 10 GM/15ML solution Take 20 g by mouth 3 (three) times daily. 03/17/21  Yes [provider]  methocarbamol  (ROBAXIN ) 750 MG tablet Take 1 tablet (750 mg total) by mouth every 8 (eight) hours as needed for muscle spasms. 06/13/24  Yes Darci Pore, MD  metolazone  (ZAROXOLYN ) 2.5 MG tablet Take 1 tablet (2.5 mg total) by mouth as needed. 05/29/24  Yes Milford, Harlene HERO, FNP  metoprolol  tartrate (LOPRESSOR ) 25 MG tablet Take 25 mg by mouth 2 (two) times daily.   Yes [provider]  midodrine  (PROAMATINE ) 10 MG tablet Take 1.5 tablets (15 mg total) by mouth 3 (three) times daily. 05/29/24  Yes Milford, Harlene HERO, FNP  Multiple Vitamins-Minerals (PRESERVISION AREDS 2) CAPS Take 1 capsule by mouth daily.   Yes [provider]  pantoprazole  (PROTONIX ) 40 MG tablet Take 40 mg by mouth 2 (two) times daily. 11/17/23  Yes [provider]  potassium chloride  (MICRO-K ) 10 MEQ CR capsule Take 4 capsules (40 mEq total) by mouth 2 (two) times daily. Take  an extra 4 tablets when taking Metolazone . 04/24/24  Yes Adhikari, Ivonne, MD  Propylene Glycol (SYSTANE BALANCE) 0.6 % SOLN Place 1 drop into both eyes 2 (two) times daily.   Yes [provider]  spironolactone  (ALDACTONE ) 25 MG tablet TAKE 3 TABLETS BY MOUTH DAILY 03/05/24  Yes Sabharwal, Aditya, DO  torsemide  (DEMADEX ) 20 MG tablet Take 4 tablets (80 mg total) by mouth in the morning and 2 tablets (40 mg total) in the evening 04/24/24  Yes Adhikari, Amrit, MD  TRESIBA  FLEXTOUCH 100 UNIT/ML FlexTouch Pen Inject 36 Units into the skin daily. (TITRATE WHEN DIRECTED UP TO 50 UNITS) 01/22/24  Yes [provider]  XIFAXAN  550 MG TABS tablet Take 550 mg by mouth 2 (two) times daily. 03/13/21  Yes [provider]    Current Facility-Administered Medications  Medication Dose Route Frequency  Provider Last Rate Last Admin   0.9 %  sodium chloride  infusion (Manually program via Guardrails IV Fluids)   Intravenous Once Mansy, Jan A, MD       digoxin  (LANOXIN ) tablet 0.125 mg  0.125 mg Oral Daily Lonni Slain, MD   0.125 mg at 07/26/24 0848   iron sucrose (VENOFER) 400 mg in sodium chloride  0.9 % 250 mL IVPB  400 mg Intravenous Q24H Fairy Frames, MD       lactulose  (CHRONULAC ) 10 GM/15ML solution 20 g  20 g Oral BID Fairy Frames, MD       lidocaine  (XYLOCAINE ) 1 % (with pres) injection 20 mL  20 mL Intradermal Once Jenna Cordella LABOR, MD       methocarbamol  (ROBAXIN ) tablet 750 mg  750 mg Oral Q8H PRN Georgina Basket, MD   750 mg at 07/25/24 2051   metoprolol  tartrate (LOPRESSOR ) tablet 25 mg  25 mg Oral BID Lonni Slain, MD   25 mg at 07/26/24 0848   midodrine  (PROAMATINE ) tablet 10 mg  10 mg Oral BID WC Fairy Frames, MD   10 mg at 07/26/24 0848   pantoprazole  (PROTONIX ) injection 40 mg  40 mg Intravenous Q12H Georgina Basket, MD   40 mg at 07/26/24 0848   rifaximin  (XIFAXAN ) tablet 550 mg  550 mg Oral BID Georgina Basket, MD   550 mg at 07/26/24 9093   torsemide   (DEMADEX ) tablet 40 mg  40 mg Oral QPM Adams, Zane, PA-C   40 mg at 07/25/24 1814   torsemide  (DEMADEX ) tablet 80 mg  80 mg Oral Daily Adams, Zane, PA-C   80 mg at 07/26/24 0848    Allergies as of 07/24/2024 - Review Complete 07/24/2024  Allergen Reaction Noted   Celebrex [celecoxib] Other (See Comments) 12/08/2015   Shellfish allergy Swelling, Rash, and Other (See Comments) 08/02/2014   Tomato Anaphylaxis 06/11/2024   Barium-containing compounds Other (See Comments) 06/06/2017   Fentanyl  Other (See Comments) 04/26/2018   Nsaids Other (See Comments) 04/19/2024   Prednisone Other (See Comments) 12/08/2015   Sglt2 inhibitors Other (See Comments) 03/07/2023   Statins Other (See Comments) 02/03/2011   Tylenol  [acetaminophen ] Other (See Comments) 05/02/2016   Ibuprofen  Other (See Comments) 05/02/2016   Tramadol  Other (See Comments) 09/07/2018   Zetia [ezetimibe] Other (See Comments) 03/29/2021    Family History  Problem Relation Age of Onset   Heart failure Mother    Cancer Mother    Hyperlipidemia Mother    Congestive Heart Failure Mother    Stroke Mother    Lung cancer Father    Cancer Father    Cancer Brother    Hyperlipidemia Brother    Stroke Other    Breast cancer Neg Hx     Social History   Socioeconomic History   Marital status: Married    Spouse name: Not on file   Number of children: Not on file   Years of education: Not on file   Highest education level: Not on file  Occupational History   Occupation: Scientist, Product/process Development  Tobacco Use   Smoking status: Former    Current packs/day: 0.00    Average packs/day: 1 pack/day for 3.0 years (3.0 ttl pk-yrs)    Types: Cigarettes    Start date: 09/20/1971    Quit date: 09/19/1974    Years since quitting: 49.8   Smokeless tobacco: Never  Vaping Use   Vaping status: Never Used  Substance and Sexual Activity   Alcohol  use: Not Currently  Comment: occ   Drug use: No   Sexual activity: Not on file  Other Topics Concern    Not on file  Social History Narrative   Lives in [place] with her husband.  Instructor for early childhood development.     Social Drivers of Corporate Investment Banker Strain: Not on file  Food Insecurity: No Food Insecurity (07/24/2024)   Hunger Vital Sign    Worried About Running Out of Food in the Last Year: Never true    Ran Out of Food in the Last Year: Never true  Transportation Needs: No Transportation Needs (07/24/2024)   PRAPARE - Administrator, Civil Service (Medical): No    Lack of Transportation (Non-Medical): No  Physical Activity: Not on file  Stress: Not on file  Social Connections: Socially Integrated (07/24/2024)   Social Connection and Isolation Panel    Frequency of Communication with Friends and Family: More than three times a week    Frequency of Social Gatherings with Friends and Family: More than three times a week    Attends Religious Services: More than 4 times per year    Active Member of [employer] or Organizations: No    Attends Engineer, Structural: More than 4 times per year    Marital Status: Married  Catering Manager Violence: Not At Risk (07/24/2024)   Humiliation, Afraid, Rape, and Kick questionnaire    Fear of Current or Ex-Partner: No    Emotionally Abused: No    Physically Abused: No    Sexually Abused: No    Review of Systems: As per HPI, all others negative.  Physical Exam: Vital signs in last 24 hours: Temp:  [97.4 F (36.3 C)-98.6 F (37 C)] 97.4 F (36.3 C) (11/07 1025) Pulse Rate:  [90-112] 101 (11/07 1025) Resp:  [14-20] 19 (11/07 1025) BP: (93-114)/(34-63) 99/52 (11/07 1025) SpO2:  [91 %-98 %] 98 % (11/07 1025) Last BM Date : 07/25/24 General:   Alert, overweight, NAD Head:  Normocephalic and atraumatic. Eyes:  Sclera clear, no icterus.   Conjunctiva pink. Ears:  Normal auditory acuity. Nose:  No deformity, discharge,  or lesions. Mouth:  No deformity or lesions.  Oropharynx pink & moist. Neck:  Supple;  no masses or thyromegaly. Lungs:  No respiratory distress Abdomen:  Large abdominal pannus Msk:  Symmetrical without gross deformities. Normal posture. Pulses:  Normal pulses noted. Extremities:  Without clubbing or edema. Neurologic:  Alert and  oriented x4;  grossly normal neurologically. Skin:  Intact without significant lesions or rashes. Psych:  Alert and cooperative. Normal mood and affect.   Lab Results: Recent Labs    07/24/24 0125 07/25/24 0912 07/26/24 0419  WBC 4.2 2.7* 2.6*  HGB 7.7* 7.1* 6.8*  HCT 25.7* 23.2* 22.5*  PLT 24* 20* 25*   BMET Recent Labs    07/24/24 0125 07/25/24 0427 07/26/24 0419  NA 134* 137 135  K 4.0 3.6 4.0  CL 102 103 101  CO2 18* 20* 21*  GLUCOSE 135* 131* 134*  BUN 28* 30* 30*  CREATININE 1.56* 1.73* 1.74*  CALCIUM  8.7* 8.5* 8.3*   LFT Recent Labs    07/26/24 0419  PROT 6.3*  ALBUMIN  2.6*  AST 36  ALT 17  ALKPHOS 59  BILITOT 2.9*   PT/INR No results for input(s): LABPROT, INR in the last 72 hours.  Studies/Results: No results found.  Impression:   Pancytopenia, chronic, with worsening anemia and profound thrombocytopenia.  No overt bleeding. Seemingly  enlarging gastric/paraesophageal varices.  Plan:   I would advise repeat endoscopy to assess for any esophageal varices amenable to banding.  We typically do not do gastric variceal banding (because it's higher risk and not very helpful) in absence of overt hemorrhage which patient fortunately does not have. Patient would be best served doing endoscopy in hospital during this admission given her multiple co morbidities and profound thrombocytopenia. We will try to arrange endoscopy tomorrow with administration of platelets pre procedure.   [facility] will follow.   LOS: 2 days   Marium Ragan M  07/26/2024, 11:58 AM  Cell [phone] If no answer or after 5 PM call [phone]

## 2024-07-26 NOTE — Plan of Care (Signed)

## 2024-07-26 NOTE — H&P (View-Only) (Signed)
 [facility] Consultation Note  Referring Provider: [facility] Primary Care Physician:  Regino Slater, MD Primary Gastroenterologist:  Dr. Burnette  Reason for Consultation:  anemia  HPI: Hannah Khan is a 68 y.o. female admitted abdominal discomfort and weakness.  No hematemesis or blood in stool.  Has known gastric varices with attempted (failed) BRTO and another attempt by IR to resolve these, unsuccessful.  Her abdominal pain has improved.  She has had a couple recurrent admissions for transfusion-requiring anemia.  Last endoscopy and colonoscopy August 2023.   Past Medical History:  Diagnosis Date   Anemia    hx of   Aortic stenosis 02/2023   Echo 03/07/23 LVEF 55-60%, mild to moderate AS   Arthritis    knees   CHF (congestive heart failure) (HCC)    chronic diastolic   Diabetes mellitus without complication (HCC)    type 2   Dyspnea    Dysrhythmia    Fibromyalgia    Gastric varices    large   H/O transfusion of platelets    Heart murmur    never has caused any problems   Hx of colonic polyps    s/p partial colectomy   Hyperlipidemia    Hypertension    Joint pain    in knees and back spasms   Left leg swelling    wear compression hose   Liver cirrhosis secondary to NASH (nonalcoholic steatohepatitis) (HCC) dx nov 2014   had enlarged spleen also    Morbid obesity (HCC)    Persistent atrial fibrillation (HCC)    Pneumonia 10/2016   x 5   PONV (postoperative nausea and vomiting)    in past none recent   Portal hypertension (HCC)    Sleep apnea    uses cpap, setting varies between 14-20   Spleen enlarged    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     Past Surgical History:  Procedure Laterality Date   BREAST LUMPECTOMY WITH RADIOACTIVE SEED LOCALIZATION Right 08/29/2017   Procedure: RIGHT BREAST LUMPECTOMY WITH RADIOACTIVE SEEDS LOCALIZATION;  Surgeon: Vanderbilt Ned, MD;  Location: [facility] OR;  Service: General;  Laterality: Right;   CESAREAN  SECTION  1989   CHOLECYSTECTOMY  20 yrs ago   COLECTOMY  2011   COLONOSCOPY     COLONOSCOPY N/A 09/05/2018   Procedure: COLONOSCOPY;  Surgeon: Burnette Fallow, MD;  Location: [facility];  Service: Endoscopy;  Laterality: N/A;   COLONOSCOPY WITH PROPOFOL  Bilateral 05/04/2022   Procedure: COLONOSCOPY WITH PROPOFOL ;  Surgeon: Burnette Fallow, MD;  Location: [facility];  Service: Gastroenterology;  Laterality: Bilateral;   DILATATION & CURETTAGE/HYSTEROSCOPY WITH MYOSURE N/A 05/06/2016   Procedure: DILATATION & CURETTAGE/HYSTEROSCOPY WITH MYOSURE WITH POLYPECTOMY;  Surgeon: Delon Prude, DO;  Location: [facility];  Service: Gynecology;  Laterality: N/A;   ESOPHAGOGASTRODUODENOSCOPY (EGD) WITH PROPOFOL  N/A 09/04/2013   Procedure: ESOPHAGOGASTRODUODENOSCOPY (EGD) WITH PROPOFOL ;  Surgeon: Fallow Burnette, MD;  Location: [facility];  Service: Endoscopy;  Laterality: N/A;   ESOPHAGOGASTRODUODENOSCOPY (EGD) WITH PROPOFOL  Bilateral 05/04/2022   Procedure: ESOPHAGOGASTRODUODENOSCOPY (EGD) WITH PROPOFOL ;  Surgeon: Burnette Fallow, MD;  Location: [facility];  Service: Gastroenterology;  Laterality: Bilateral;   IR EMBO ART  VEN HEMORR LYMPH EXTRAV  INC GUIDE ROADMAPPING  06/30/2023   IR INTRAVASCULAR ULTRASOUND NON CORONARY  12/23/2022   IR RADIOLOGIST EVAL & MGMT  09/21/2022   IR RADIOLOGIST EVAL & MGMT  10/06/2022   IR RADIOLOGIST EVAL & MGMT  11/02/2022   IR RADIOLOGIST EVAL & MGMT  01/31/2023   IR  RADIOLOGIST EVAL & MGMT  05/31/2023   IR RADIOLOGIST EVAL & MGMT  08/21/2023   IR RADIOLOGIST EVAL & MGMT  12/25/2023   IR TIPS  12/23/2022   IR TIPS  06/30/2023   IR US  GUIDE VASC ACCESS LEFT  06/30/2023   IR US  GUIDE VASC ACCESS RIGHT  12/23/2022   IR US  GUIDE VASC ACCESS RIGHT  06/30/2023   IR US  GUIDE VASC ACCESS RIGHT  06/30/2023   KNEE ARTHROSCOPY  yrs ago   bilateral, one done x 1, one done twice   POLYPECTOMY  09/05/2018   Procedure: POLYPECTOMY;  Surgeon: Burnette Fallow, MD;  Location: [facility];   Service: Endoscopy;;   RADIOLOGY WITH ANESTHESIA N/A 12/23/2022   Procedure: TIPS;  Surgeon: Jennefer Ester PARAS, MD;  Location: [facility];  Service: Radiology;  Laterality: N/A;   RIGHT HEART CATH N/A 05/19/2023   Procedure: RIGHT HEART CATH;  Surgeon: Gardenia Led, DO;  Location: [facility];  Service: Cardiovascular;  Laterality: N/A;   TIPS PROCEDURE N/A 06/30/2023   Procedure: TRANS-JUGULAR INTRAHEPATIC PORTAL SHUNT (TIPS);  Surgeon: Jennefer Ester PARAS, MD;  Location: [facility] OR;  Service: Radiology;  Laterality: N/A;    Prior to Admission medications   Medication Sig Start Date End Date Taking? Authorizing Provider  albuterol  (VENTOLIN  HFA) 108 (90 Base) MCG/ACT inhaler Inhale 1-2 puffs into the lungs every 6 (six) hours as needed for wheezing or shortness of breath.   Yes [provider]  digoxin  (LANOXIN ) 0.125 MG tablet TAKE 1 TABLET BY MOUTH EVERY DAY 04/17/24  Yes Sabharwal, Aditya, DO  lactulose  (CHRONULAC ) 10 GM/15ML solution Take 20 g by mouth 3 (three) times daily. 03/17/21  Yes [provider]  methocarbamol  (ROBAXIN ) 750 MG tablet Take 1 tablet (750 mg total) by mouth every 8 (eight) hours as needed for muscle spasms. 06/13/24  Yes Darci Pore, MD  metolazone  (ZAROXOLYN ) 2.5 MG tablet Take 1 tablet (2.5 mg total) by mouth as needed. 05/29/24  Yes Milford, Harlene HERO, FNP  metoprolol  tartrate (LOPRESSOR ) 25 MG tablet Take 25 mg by mouth 2 (two) times daily.   Yes [provider]  midodrine  (PROAMATINE ) 10 MG tablet Take 1.5 tablets (15 mg total) by mouth 3 (three) times daily. 05/29/24  Yes Milford, Harlene HERO, FNP  Multiple Vitamins-Minerals (PRESERVISION AREDS 2) CAPS Take 1 capsule by mouth daily.   Yes [provider]  pantoprazole  (PROTONIX ) 40 MG tablet Take 40 mg by mouth 2 (two) times daily. 11/17/23  Yes [provider]  potassium chloride  (MICRO-K ) 10 MEQ CR capsule Take 4 capsules (40 mEq total) by mouth 2 (two) times daily. Take  an extra 4 tablets when taking Metolazone . 04/24/24  Yes Adhikari, Ivonne, MD  Propylene Glycol (SYSTANE BALANCE) 0.6 % SOLN Place 1 drop into both eyes 2 (two) times daily.   Yes [provider]  spironolactone  (ALDACTONE ) 25 MG tablet TAKE 3 TABLETS BY MOUTH DAILY 03/05/24  Yes Sabharwal, Aditya, DO  torsemide  (DEMADEX ) 20 MG tablet Take 4 tablets (80 mg total) by mouth in the morning and 2 tablets (40 mg total) in the evening 04/24/24  Yes Adhikari, Amrit, MD  TRESIBA  FLEXTOUCH 100 UNIT/ML FlexTouch Pen Inject 36 Units into the skin daily. (TITRATE WHEN DIRECTED UP TO 50 UNITS) 01/22/24  Yes [provider]  XIFAXAN  550 MG TABS tablet Take 550 mg by mouth 2 (two) times daily. 03/13/21  Yes [provider]    Current Facility-Administered Medications  Medication Dose Route Frequency  Provider Last Rate Last Admin   0.9 %  sodium chloride  infusion (Manually program via Guardrails IV Fluids)   Intravenous Once Mansy, Jan A, MD       digoxin  (LANOXIN ) tablet 0.125 mg  0.125 mg Oral Daily Lonni Slain, MD   0.125 mg at 07/26/24 0848   iron sucrose (VENOFER) 400 mg in sodium chloride  0.9 % 250 mL IVPB  400 mg Intravenous Q24H Fairy Frames, MD       lactulose  (CHRONULAC ) 10 GM/15ML solution 20 g  20 g Oral BID Fairy Frames, MD       lidocaine  (XYLOCAINE ) 1 % (with pres) injection 20 mL  20 mL Intradermal Once Jenna Cordella LABOR, MD       methocarbamol  (ROBAXIN ) tablet 750 mg  750 mg Oral Q8H PRN Georgina Basket, MD   750 mg at 07/25/24 2051   metoprolol  tartrate (LOPRESSOR ) tablet 25 mg  25 mg Oral BID Lonni Slain, MD   25 mg at 07/26/24 0848   midodrine  (PROAMATINE ) tablet 10 mg  10 mg Oral BID WC Fairy Frames, MD   10 mg at 07/26/24 0848   pantoprazole  (PROTONIX ) injection 40 mg  40 mg Intravenous Q12H Georgina Basket, MD   40 mg at 07/26/24 0848   rifaximin  (XIFAXAN ) tablet 550 mg  550 mg Oral BID Georgina Basket, MD   550 mg at 07/26/24 9093   torsemide   (DEMADEX ) tablet 40 mg  40 mg Oral QPM Adams, Zane, PA-C   40 mg at 07/25/24 1814   torsemide  (DEMADEX ) tablet 80 mg  80 mg Oral Daily Adams, Zane, PA-C   80 mg at 07/26/24 0848    Allergies as of 07/24/2024 - Review Complete 07/24/2024  Allergen Reaction Noted   Celebrex [celecoxib] Other (See Comments) 12/08/2015   Shellfish allergy Swelling, Rash, and Other (See Comments) 08/02/2014   Tomato Anaphylaxis 06/11/2024   Barium-containing compounds Other (See Comments) 06/06/2017   Fentanyl  Other (See Comments) 04/26/2018   Nsaids Other (See Comments) 04/19/2024   Prednisone Other (See Comments) 12/08/2015   Sglt2 inhibitors Other (See Comments) 03/07/2023   Statins Other (See Comments) 02/03/2011   Tylenol  [acetaminophen ] Other (See Comments) 05/02/2016   Ibuprofen  Other (See Comments) 05/02/2016   Tramadol  Other (See Comments) 09/07/2018   Zetia [ezetimibe] Other (See Comments) 03/29/2021    Family History  Problem Relation Age of Onset   Heart failure Mother    Cancer Mother    Hyperlipidemia Mother    Congestive Heart Failure Mother    Stroke Mother    Lung cancer Father    Cancer Father    Cancer Brother    Hyperlipidemia Brother    Stroke Other    Breast cancer Neg Hx     Social History   Socioeconomic History   Marital status: Married    Spouse name: Not on file   Number of children: Not on file   Years of education: Not on file   Highest education level: Not on file  Occupational History   Occupation: Scientist, Product/process Development  Tobacco Use   Smoking status: Former    Current packs/day: 0.00    Average packs/day: 1 pack/day for 3.0 years (3.0 ttl pk-yrs)    Types: Cigarettes    Start date: 09/20/1971    Quit date: 09/19/1974    Years since quitting: 49.8   Smokeless tobacco: Never  Vaping Use   Vaping status: Never Used  Substance and Sexual Activity   Alcohol  use: Not Currently  Comment: occ   Drug use: No   Sexual activity: Not on file  Other Topics Concern    Not on file  Social History Narrative   Lives in [place] with her husband.  Instructor for early childhood development.     Social Drivers of Corporate Investment Banker Strain: Not on file  Food Insecurity: No Food Insecurity (07/24/2024)   Hunger Vital Sign    Worried About Running Out of Food in the Last Year: Never true    Ran Out of Food in the Last Year: Never true  Transportation Needs: No Transportation Needs (07/24/2024)   PRAPARE - Administrator, Civil Service (Medical): No    Lack of Transportation (Non-Medical): No  Physical Activity: Not on file  Stress: Not on file  Social Connections: Socially Integrated (07/24/2024)   Social Connection and Isolation Panel    Frequency of Communication with Friends and Family: More than three times a week    Frequency of Social Gatherings with Friends and Family: More than three times a week    Attends Religious Services: More than 4 times per year    Active Member of [employer] or Organizations: No    Attends Engineer, Structural: More than 4 times per year    Marital Status: Married  Catering Manager Violence: Not At Risk (07/24/2024)   Humiliation, Afraid, Rape, and Kick questionnaire    Fear of Current or Ex-Partner: No    Emotionally Abused: No    Physically Abused: No    Sexually Abused: No    Review of Systems: As per HPI, all others negative.  Physical Exam: Vital signs in last 24 hours: Temp:  [97.4 F (36.3 C)-98.6 F (37 C)] 97.4 F (36.3 C) (11/07 1025) Pulse Rate:  [90-112] 101 (11/07 1025) Resp:  [14-20] 19 (11/07 1025) BP: (93-114)/(34-63) 99/52 (11/07 1025) SpO2:  [91 %-98 %] 98 % (11/07 1025) Last BM Date : 07/25/24 General:   Alert, overweight, NAD Head:  Normocephalic and atraumatic. Eyes:  Sclera clear, no icterus.   Conjunctiva pink. Ears:  Normal auditory acuity. Nose:  No deformity, discharge,  or lesions. Mouth:  No deformity or lesions.  Oropharynx pink & moist. Neck:  Supple;  no masses or thyromegaly. Lungs:  No respiratory distress Abdomen:  Large abdominal pannus Msk:  Symmetrical without gross deformities. Normal posture. Pulses:  Normal pulses noted. Extremities:  Without clubbing or edema. Neurologic:  Alert and  oriented x4;  grossly normal neurologically. Skin:  Intact without significant lesions or rashes. Psych:  Alert and cooperative. Normal mood and affect.   Lab Results: Recent Labs    07/24/24 0125 07/25/24 0912 07/26/24 0419  WBC 4.2 2.7* 2.6*  HGB 7.7* 7.1* 6.8*  HCT 25.7* 23.2* 22.5*  PLT 24* 20* 25*   BMET Recent Labs    07/24/24 0125 07/25/24 0427 07/26/24 0419  NA 134* 137 135  K 4.0 3.6 4.0  CL 102 103 101  CO2 18* 20* 21*  GLUCOSE 135* 131* 134*  BUN 28* 30* 30*  CREATININE 1.56* 1.73* 1.74*  CALCIUM  8.7* 8.5* 8.3*   LFT Recent Labs    07/26/24 0419  PROT 6.3*  ALBUMIN  2.6*  AST 36  ALT 17  ALKPHOS 59  BILITOT 2.9*   PT/INR No results for input(s): LABPROT, INR in the last 72 hours.  Studies/Results: No results found.  Impression:   Pancytopenia, chronic, with worsening anemia and profound thrombocytopenia.  No overt bleeding. Seemingly  enlarging gastric/paraesophageal varices.  Plan:   I would advise repeat endoscopy to assess for any esophageal varices amenable to banding.  We typically do not do gastric variceal banding (because it's higher risk and not very helpful) in absence of overt hemorrhage which patient fortunately does not have. Patient would be best served doing endoscopy in hospital during this admission given her multiple co morbidities and profound thrombocytopenia. We will try to arrange endoscopy tomorrow with administration of platelets pre procedure.   [facility] will follow.   LOS: 2 days   Marium Ragan M  07/26/2024, 11:58 AM  Cell [phone] If no answer or after 5 PM call [phone]

## 2024-07-26 NOTE — Progress Notes (Addendum)
 PROGRESS NOTE    Hannah Khan  FMW:[id number] DOB: 1956-08-07 DOA: 07/24/2024 PCP: Hannah Slater, MD  68/F, chronically ill morbidly obese female with diastolic CHF, NASH/cirrhosis, esophageal varices with TIPS in 4/24, permanent A-fib not on anticoagulation, chronic thrombocytopenia, type 2 diabetes, CKD 3B chronic hypotension presented to the ED with abdominal discomfort, shortness of breath and A-fib RVR.  In the ED she was noted to be in A-fib with heart rate in the 130s, tachypneic, BNP 292, creatinine 1.5, troponin 30, 44 - Ultrasound only with small volume ascites, no safe window for paracentesis - CT abdomen pelvis with cirrhosis, bulky chronic varices, increased small volume ascites, abdominal pain Hannah Khan with soft tissue density, generalized body wall edema - CTA chest with no PE, small bilateral pleural effusions  Subjective: Feels better today, belly pain has resolved  Assessment and Plan:  Acute on chronic diastolic CHF Moderate aortic stenosis - Last echo 8/25 with EF 60-65, mildly reduced RV, mildly elevated PASP, moderate aortic stenosis - Status improving, urine output inaccurate, seen by cards switch to oral torsemide  now - GDMT limited by hypotension, on midodrine  at baseline, resumed - Aldactone  on hold, resume soon if blood pressure and kidney function tolerate  Atrial fibrillation with RVR - Not on anticoagulation secondary to thrombocytopenia/cirrhosis - Currently on metoprolol  and and digoxin , heart rate now improving  NASH/cirrhosis Esophageal varices - Decompensated with volume overload as noted above, - Only small volume ascites on ultrasound History of TIPS in 4/24 - Diuretics as noted above, continue lactulose  and rifaximin   Acute on chronic anemia Iron deficiency anemia - Recently transfused PRBC about a week ago - Anemia panel with severe deficiency, she denies overt bleeding however has cirrhosis and bulky varices, continue PPI, could be  portal hypertensive gastropathy in the setting of cirrhosis - last EGD 8/23 noted small varices then - Hemoglobin down again to 6.8, transfusing PRBC, will ask GI to evaluate - Add IV iron  Abdominal pain/discomfort - Suspect this is secondary to edema, now improving  AKI on CKD 3a - Likely cardiorenal as well as got contrast in the ER yesterday, monitor  ?  Abdominal wall cellulitis - CT mentioned increased soft tissue density and stranding, clinically does not have cellulitis, has a area of increased edema without signs of infection likely related to generalized body edema, procalcitonin was elevated on admission, no signs or symptoms of infection will DC ABX  Thrombocytopenia -due to Cirrhosis  DVT prophylaxis: SCDs Code Status: Full code Family Communication: None present Disposition Plan: Home pending above management  Consultants:    Procedures:   Antimicrobials:    Objective: Vitals:   07/26/24 0640 07/26/24 0851 07/26/24 1003 07/26/24 1025  BP: (!) 107/55 (!) 100/34 (!) 106/35 (!) 99/52  Pulse: 95 (!) 106 (!) 105 (!) 101  Resp: 20 16 19 19   Temp: 98.6 F (37 C) 98.3 F (36.8 C) 97.7 F (36.5 C) (!) 97.4 F (36.3 C)  TempSrc: Oral Oral Oral Oral  SpO2:  98% 91% 98%  Weight:      Height:        Intake/Output Summary (Last 24 hours) at 07/26/2024 1127 Last data filed at 07/26/2024 0850 Gross per 24 hour  Intake 1686.47 ml  Output 2480 ml  Net -793.53 ml   Filed Weights   07/24/24 0107 07/24/24 1559  Weight: 130 kg 131.1 kg    Examination:  General exam: Appears calm and comfortable, AO x 3 Respiratory system: Decreased breath sounds at the bases  Cardiovascular system: S1 & S2 heard, RRR.  Abd: nondistended, soft and nontender.Normal bowel sounds heard.  Lower abdominal pannus with fluid Central nervous system: Alert and oriented. No focal neurological deficits. Extremities: Trace + edema Skin: No rashes Psychiatry:  Mood & affect appropriate.      Data Reviewed:   CBC: Recent Labs  Lab 07/24/24 0125 07/25/24 0912 07/26/24 0419  WBC 4.2 2.7* 2.6*  NEUTROABS 3.7  --   --   HGB 7.7* 7.1* 6.8*  HCT 25.7* 23.2* 22.5*  MCV 90.8 89.6 90.0  PLT 24* 20* 25*   Basic Metabolic Panel: Recent Labs  Lab 07/24/24 0125 07/25/24 0427 07/26/24 0419  NA 134* 137 135  K 4.0 3.6 4.0  CL 102 103 101  CO2 18* 20* 21*  GLUCOSE 135* 131* 134*  BUN 28* 30* 30*  CREATININE 1.56* 1.73* 1.74*  CALCIUM  8.7* 8.5* 8.3*   GFR: Estimated Creatinine Clearance: 43.7 mL/min (A) (by C-G formula based on SCr of 1.74 mg/dL (H)). Liver Function Tests: Recent Labs  Lab 07/24/24 0125 07/26/24 0419  AST 29 36  ALT 17 17  ALKPHOS 72 59  BILITOT 3.3* 2.9*  PROT 6.8 6.3*  ALBUMIN  3.0* 2.6*   Recent Labs  Lab 07/24/24 1813  LIPASE 45   No results for input(s): AMMONIA in the last 168 hours. Coagulation Profile: No results for input(s): INR, PROTIME in the last 168 hours. Cardiac Enzymes: No results for input(s): CKTOTAL, CKMB, CKMBINDEX, TROPONINI in the last 168 hours. BNP (last 3 results) Recent Labs    02/02/24 0900  PROBNP 1,655.0*   HbA1C: No results for input(s): HGBA1C in the last 72 hours. CBG: Recent Labs  Lab 07/24/24 1642  GLUCAP 185*   Lipid Profile: No results for input(s): CHOL, HDL, LDLCALC, TRIG, CHOLHDL, LDLDIRECT in the last 72 hours. Thyroid  Function Tests: No results for input(s): TSH, T4TOTAL, FREET4, T3FREE, THYROIDAB in the last 72 hours. Anemia Panel: No results for input(s): VITAMINB12, FOLATE, FERRITIN, TIBC, IRON, RETICCTPCT in the last 72 hours. Urine analysis:    Component Value Date/Time   COLORURINE YELLOW 07/24/2024 0610   APPEARANCEUR HAZY (A) 07/24/2024 0610   LABSPEC 1.027 07/24/2024 0610   PHURINE 5.0 07/24/2024 0610   GLUCOSEU NEGATIVE 07/24/2024 0610   HGBUR NEGATIVE 07/24/2024 0610   BILIRUBINUR NEGATIVE 07/24/2024 0610    KETONESUR NEGATIVE 07/24/2024 0610   PROTEINUR NEGATIVE 07/24/2024 0610   UROBILINOGEN 2.0 (H) 01/31/2015 0055   NITRITE NEGATIVE 07/24/2024 0610   LEUKOCYTESUR NEGATIVE 07/24/2024 0610   Sepsis Labs: @LABRCNTIP (procalcitonin:4,lacticidven:4)  )No results found for this or any previous visit (from the past 240 hours).   Radiology Studies: No results found.    Scheduled Meds:  sodium chloride    Intravenous Once   digoxin   0.125 mg Oral Daily   lactulose   20 g Oral TID   lidocaine   20 mL Intradermal Once   metoprolol  tartrate  25 mg Oral BID   midodrine   10 mg Oral BID WC   pantoprazole  (PROTONIX ) IV  40 mg Intravenous Q12H   rifaximin   550 mg Oral BID   torsemide   40 mg Oral QPM   torsemide   80 mg Oral Daily   Continuous Infusions:  piperacillin -tazobactam (ZOSYN )  IV 12.5 mL/hr at 07/26/24 1000     LOS: 2 days    Time spent:    Sigurd Pac, MD [facility]   07/26/2024, 11:27 AM

## 2024-07-27 ENCOUNTER — Inpatient Hospital Stay (HOSPITAL_COMMUNITY): Admitting: Anesthesiology

## 2024-07-27 ENCOUNTER — Encounter (HOSPITAL_COMMUNITY)
Admission: EM | Disposition: A | Discharge status: Home / Self Care | Payer: Self-pay | Source: Home / Self Care | Attending: Internal Medicine

## 2024-07-27 ENCOUNTER — Encounter (HOSPITAL_COMMUNITY): Payer: Self-pay | Admitting: Internal Medicine

## 2024-07-27 DIAGNOSIS — I5022 Chronic systolic (congestive) heart failure: Secondary | ICD-10-CM | POA: Diagnosis not present

## 2024-07-27 DIAGNOSIS — I110 Hypertensive heart disease with heart failure: Secondary | ICD-10-CM | POA: Diagnosis not present

## 2024-07-27 DIAGNOSIS — R1084 Generalized abdominal pain: Secondary | ICD-10-CM | POA: Diagnosis not present

## 2024-07-27 DIAGNOSIS — I4891 Unspecified atrial fibrillation: Secondary | ICD-10-CM | POA: Diagnosis not present

## 2024-07-27 DIAGNOSIS — J449 Chronic obstructive pulmonary disease, unspecified: Secondary | ICD-10-CM | POA: Diagnosis not present

## 2024-07-27 DIAGNOSIS — I5032 Chronic diastolic (congestive) heart failure: Secondary | ICD-10-CM | POA: Diagnosis not present

## 2024-07-27 DIAGNOSIS — D649 Anemia, unspecified: Secondary | ICD-10-CM | POA: Diagnosis not present

## 2024-07-27 HISTORY — PX: ESOPHAGOGASTRODUODENOSCOPY: SHX5428

## 2024-07-27 LAB — COMPREHENSIVE METABOLIC PANEL WITH GFR
ALT: 15 U/L (ref 0–44)
AST: 24 U/L (ref 15–41)
Albumin: 2.8 g/dL — ABNORMAL LOW (ref 3.5–5.0)
Alkaline Phosphatase: 61 U/L (ref 38–126)
Anion gap: 14 (ref 5–15)
BUN: 26 mg/dL — ABNORMAL HIGH (ref 8–23)
CO2: 23 mmol/L (ref 22–32)
Calcium: 8.6 mg/dL — ABNORMAL LOW (ref 8.9–10.3)
Chloride: 101 mmol/L (ref 98–111)
Creatinine, Ser: 1.6 mg/dL — ABNORMAL HIGH (ref 0.44–1.00)
GFR, Estimated: 35 mL/min — ABNORMAL LOW (ref >60)
Glucose, Bld: 118 mg/dL — ABNORMAL HIGH (ref 70–99)
Potassium: 3.1 mmol/L — ABNORMAL LOW (ref 3.5–5.1)
Sodium: 138 mmol/L (ref 135–145)
Total Bilirubin: 4.4 mg/dL — ABNORMAL HIGH (ref 0.0–1.2)
Total Protein: 6.8 g/dL (ref 6.5–8.1)

## 2024-07-27 LAB — CBC
HCT: 28.1 % — ABNORMAL LOW (ref 36.0–46.0)
HCT: 27.7 % — ABNORMAL LOW (ref 36.0–46.0)
Hemoglobin: 8.7 g/dL — ABNORMAL LOW (ref 12.0–15.0)
Hemoglobin: 8.7 g/dL — ABNORMAL LOW (ref 12.0–15.0)
MCH: 27.4 pg (ref 26.0–34.0)
MCH: 27.4 pg (ref 26.0–34.0)
MCHC: 31 g/dL (ref 30.0–36.0)
MCHC: 31.4 g/dL (ref 30.0–36.0)
MCV: 88.6 fL (ref 80.0–100.0)
MCV: 87.4 fL (ref 80.0–100.0)
Platelets: 25 K/uL — CL (ref 150–400)
Platelets: 27 K/uL — CL (ref 150–400)
RBC: 3.17 MIL/uL — ABNORMAL LOW (ref 3.87–5.11)
RBC: 3.17 MIL/uL — ABNORMAL LOW (ref 3.87–5.11)
RDW: 17.2 % — ABNORMAL HIGH (ref 11.5–15.5)
RDW: 17.1 % — ABNORMAL HIGH (ref 11.5–15.5)
WBC: 3.3 K/uL — ABNORMAL LOW (ref 4.0–10.5)
WBC: 4.2 K/uL (ref 4.0–10.5)
nRBC: 0 % (ref 0.0–0.2)
nRBC: 0 % (ref 0.0–0.2)

## 2024-07-27 LAB — GLUCOSE, CAPILLARY: Glucose-Capillary: 120 mg/dL — ABNORMAL HIGH (ref 70–99)

## 2024-07-27 SURGERY — EGD (ESOPHAGOGASTRODUODENOSCOPY)
Anesthesia: General | Laterality: Left

## 2024-07-27 MED ORDER — POTASSIUM CHLORIDE CRYS ER 20 MEQ PO TBCR
40.0000 meq | EXTENDED_RELEASE_TABLET | ORAL | Status: AC
Start: 2024-07-27 — End: 2024-07-28
  Administered 2024-07-27 – 2024-07-28 (×2): 40 meq via ORAL
  Filled 2024-07-27 (×2): qty 2

## 2024-07-27 MED ORDER — MIDAZOLAM HCL (PF) 2 MG/2ML IJ SOLN
INTRAMUSCULAR | Status: DC | PRN
  Administered 2024-07-27: 2 mg via INTRAVENOUS

## 2024-07-27 MED ORDER — SUCCINYLCHOLINE CHLORIDE 200 MG/10ML IV SOSY
PREFILLED_SYRINGE | INTRAVENOUS | Status: DC | PRN
  Administered 2024-07-27: 140 mg via INTRAVENOUS

## 2024-07-27 MED ORDER — PHENYLEPHRINE 80 MCG/ML (10ML) SYRINGE FOR IV PUSH (FOR BLOOD PRESSURE SUPPORT)
PREFILLED_SYRINGE | INTRAVENOUS | Status: DC | PRN
Start: 2024-07-27 — End: 2024-07-27
  Administered 2024-07-27: 160 ug via INTRAVENOUS

## 2024-07-27 MED ORDER — ONDANSETRON HCL 4 MG/2ML IJ SOLN
INTRAMUSCULAR | Status: DC | PRN
  Administered 2024-07-27: 4 mg via INTRAVENOUS

## 2024-07-27 MED ORDER — DEXMEDETOMIDINE HCL IN NACL 80 MCG/20ML IV SOLN
INTRAVENOUS | Status: DC | PRN
  Administered 2024-07-27: 8 ug via INTRAVENOUS

## 2024-07-27 MED ORDER — LIDOCAINE 2% (20 MG/ML) 5 ML SYRINGE
INTRAMUSCULAR | Status: DC | PRN
  Administered 2024-07-27: 60 mg via INTRAVENOUS

## 2024-07-27 MED ORDER — MIDAZOLAM HCL 2 MG/2ML IJ SOLN
INTRAMUSCULAR | Status: AC
  Filled 2024-07-27: qty 2

## 2024-07-27 MED ORDER — ALBUTEROL SULFATE HFA 108 (90 BASE) MCG/ACT IN AERS
INHALATION_SPRAY | RESPIRATORY_TRACT | Status: DC | PRN
  Administered 2024-07-27: 2 via RESPIRATORY_TRACT

## 2024-07-27 MED ORDER — SODIUM CHLORIDE 0.9 % IV SOLN: INTRAVENOUS | Status: DC | PRN

## 2024-07-27 MED ORDER — PROPOFOL 10 MG/ML IV BOLUS
INTRAVENOUS | Status: DC | PRN
  Administered 2024-07-27: 90 mg via INTRAVENOUS
  Administered 2024-07-27: 50 mg via INTRAVENOUS
  Administered 2024-07-27: 30 mg via INTRAVENOUS

## 2024-07-27 MED ORDER — PHENOL 1.4 % MT LIQD
1.0000 | OROMUCOSAL | Status: DC | PRN
  Filled 2024-07-27: qty 177

## 2024-07-27 NOTE — Plan of Care (Signed)

## 2024-07-27 NOTE — Anesthesia Preprocedure Evaluation (Signed)
 Anesthesia Evaluation  Patient identified by MRN, date of birth, ID band Patient awake    Reviewed: Allergy & Precautions, NPO status , Patient's Chart, lab work & pertinent test results  History of Anesthesia Complications (+) PONV and history of anesthetic complications  Airway Mallampati: II  TM Distance: >3 FB Neck ROM: Full    Dental  (+) Teeth Intact, Dental Advisory Given   Pulmonary shortness of breath, sleep apnea , neg COPD, neg recent URI, former smoker   breath sounds clear to auscultation       Cardiovascular hypertension, Pt. on medications and Pt. on home beta blockers (-) angina +CHF  + dysrhythmias + Valvular Problems/Murmurs  Rhythm:Regular  1. Left ventricular ejection fraction, by estimation, is 60 to 65%. The  left ventricle has normal function. The left ventricle has no regional  wall motion abnormalities. Left ventricular diastolic function could not  be evaluated.   2. Right ventricular systolic function is mildly reduced. The right  ventricular size is mildly enlarged. There is mildly elevated pulmonary  artery systolic pressure.   3. Left atrial size was moderately dilated.   4. Right atrial size was mildly dilated.   5. The mitral valve is grossly normal. Trivial mitral valve  regurgitation. No evidence of mitral stenosis.   6. The aortic valve has an indeterminant number of cusps. There is  moderate calcification of the aortic valve. There is moderate thickening  of the aortic valve. Aortic valve regurgitation is not visualized.  Moderate aortic valve stenosis.   7. The inferior vena cava is dilated in size with <50% respiratory  variability, suggesting right atrial pressure of 15 mmHg.     Neuro/Psych neg Seizures    GI/Hepatic ,,,(+) Cirrhosis   Esophageal Varices    , Hepatitis -  Endo/Other  diabetes  Class 3 obesity  Renal/GU      Musculoskeletal  (+) Arthritis ,  Fibromyalgia -   Abdominal   Peds  Hematology  (+) Blood dyscrasia, anemia Lab Results      Component                Value               Date                      WBC                      4.2                 07/27/2024                HGB                      8.7 (L)             07/27/2024                HCT                      27.7 (L)            07/27/2024                MCV                      87.4                07/27/2024  PLT                      27 (LL)             07/27/2024              Anesthesia Other Findings   Reproductive/Obstetrics                              Anesthesia Physical Anesthesia Plan  ASA: 4  Anesthesia Plan: General   Post-op Pain Management:    Induction: Intravenous and Rapid sequence  PONV Risk Score and Plan: 4 or greater and Ondansetron  and Dexamethasone   Airway Management Planned: Oral ETT  Additional Equipment: None  Intra-op Plan:   Post-operative Plan: Extubation in OR  Informed Consent: I have reviewed the patients History and Physical, chart, labs and discussed the procedure including the risks, benefits and alternatives for the proposed anesthesia with the patient or authorized representative who has indicated his/her understanding and acceptance.     Dental advisory given  Plan Discussed with: CRNA  Anesthesia Plan Comments:          Anesthesia Quick Evaluation

## 2024-07-27 NOTE — Op Note (Signed)
 [facility] Patient Name: Hannah Khan Procedure Date : 07/27/2024 MRN: [id number] Attending MD: Estefana Keas DO, DO, [id number] Date of Birth: [birth year] CSN: [id number] Age: 68 Admit Type: Inpatient Procedure:                Upper GI endoscopy Indications:              Surveillance procedure, Esophageal varices, Gastric                            varices, Abnormal CT of the GI tract, Iron                            deficiency anemia secondary to chronic blood loss Providers:                Estefana Keas DO, DO, Collene Edu, RN, Joya Bunnell, Technician Referring MD:              Medicines:                See the Anesthesia note for documentation of the                            administered medications Complications:            No immediate complications. Estimated Blood Loss:     Estimated blood loss: none. Procedure:                Pre-Anesthesia Assessment:                           - ASA Grade Assessment: IV - A patient with severe                            systemic disease that is a constant threat to life.                           - The risks and benefits of the procedure and the                            sedation options and risks were discussed with the                            patient. All questions were answered and informed                            consent was obtained.                           After obtaining informed consent, the endoscope was                            passed under direct vision. Throughout the  procedure, the patient's blood pressure, pulse, and                            oxygen saturations were monitored continuously. The                            [id number]-H190 ([id number]) Olympus endoscope was introduced                            through the mouth, and advanced to the second part                            of duodenum. The upper GI endoscopy was                             accomplished without difficulty. The patient                            tolerated the procedure well. Scope In: Scope Out: Findings:      Two columns of grade II varices with no bleeding and no stigmata of       recent bleeding were found in the lower third of the esophagus. They       were 5 mm in largest diameter. No red wale signs were present.      Two columns of grade I varices with no bleeding and no stigmata of       recent bleeding were found in the middle third of the esophagus. They       were 3 mm in largest diameter. No red wale signs were present.      Single, type 2 gastroesophageal varix (GOV2, esophageal varix which       extended along the fundus) with no bleeding was found in the cardia.       There were no stigmata of recent bleeding. It was 8 mm in largest       diameter.      Moderate portal hypertensive gastropathy was found in the entire       examined stomach.      The examined duodenum was normal. Impression:               - Grade II esophageal varices with no bleeding and                            no stigmata of recent bleeding.                           - Grade I esophageal varices with no bleeding and                            no stigmata of recent bleeding.                           - Type 2 gastroesophageal varix (GOV2, esophageal                            varix which extend along the fundus), without  bleeding.                           - Portal hypertensive gastropathy.                           - Normal examined duodenum.                           - No specimens collected. Recommendation:           - Return patient to hospital ward for ongoing care.                           - Resume previous diet.                           - Continue present medications.                           - Follow up with [facility]                            [facility].                           - May benefit from EGD with  cyanoacrylate injection                            at tertiary care center for definitive varix                            management. Procedure Code(s):        --- Professional ---                           [phone], Esophagogastroduodenoscopy, flexible,                            transoral; diagnostic, including collection of                            specimen(s) by brushing or washing, when performed                            (separate procedure) Diagnosis Code(s):        --- Professional ---                           I85.00, Esophageal varices without bleeding                           I86.4, Gastric varices                           K76.6, Portal hypertension                           K31.89, Other diseases of stomach and duodenum  D50.0, Iron deficiency anemia secondary to blood                            loss (chronic)                           R93.3, Abnormal findings on diagnostic imaging of                            other parts of digestive tract CPT copyright 2022 [employer]. All rights reserved. The codes documented in this report are preliminary and upon coder review may  be revised to meet current compliance requirements. Dr Estefana Keas, DO Estefana Keas DO, DO 07/27/2024 2:02:43 PM Number of Addenda: 0

## 2024-07-27 NOTE — Consult Note (Signed)
 Chaplain Leslee went to visit patient per prayer request. Entering the room pt was accompanied by her husband. Pt stated that she was having surgery today and she was very nervous about the end results or even recovery. Pt stated that she told the surgeon on yesterday that she was nervous about having the surgery but she knows that God has the final say. After talking to pt and her husband, I've learned that pt is a true believer in God and she has faith to know that if its her time she knows where she is going. Nurse came in the room to give patient meds prior to surgery. Prayer was had with both pt and husband. I did let pt  know that I would try my best to come and visit her later on today after her surgery. Pt and husband was very pleased with today's visit.

## 2024-07-27 NOTE — Interval H&P Note (Signed)
 History and Physical Interval Note:  07/27/2024 12:25 PM  Hannah Khan  has presented today for surgery, with the diagnosis of anemia, enlarging varices on imaging.  The various methods of treatment have been discussed with the patient and family. After consideration of risks, benefits and other options for treatment, the patient has consented to  Procedure(s): EGD (ESOPHAGOGASTRODUODENOSCOPY) (Left) as a surgical intervention.  [name] history has been reviewed, patient examined, no change in status, stable for surgery.  I have reviewed the patient's chart and labs.  Questions were answered to the patient's satisfaction.     Hannah Khan

## 2024-07-27 NOTE — Transfer of Care (Signed)
 Immediate Anesthesia Transfer of Care Note  Patient: Hannah Khan  Procedure(s) Performed: EGD (ESOPHAGOGASTRODUODENOSCOPY) (Left)  Patient Location: PACU  Anesthesia Type:General  Level of Consciousness: awake, alert , oriented, and drowsy  Airway & Oxygen Therapy: Patient Spontanous Breathing and Patient connected to face mask oxygen  Post-op Assessment: Report given to RN, Post -op Vital signs reviewed and stable, and Patient moving all extremities X 4  Post vital signs: Reviewed and stable  Last Vitals:  Vitals Value Taken Time  BP 119/68 07/27/24 14:01  Temp 36.8 C 07/27/24 14:00  Pulse 113 07/27/24 14:12  Resp 19 07/27/24 14:12  SpO2 89 % 07/27/24 14:12  Vitals shown include unfiled device data.  Last Pain:  Vitals:   07/27/24 1400  TempSrc:   PainSc: 0-No pain      Patients Stated Pain Goal: 6 (07/24/24 1559)  Complications: No notable events documented.

## 2024-07-27 NOTE — Progress Notes (Signed)
 PROGRESS NOTE    Hannah Khan  FMW:[id number] DOB: May 25, 1956 DOA: 07/24/2024 PCP: Regino Slater, MD  68/F, chronically ill morbidly obese female with diastolic CHF, NASH/cirrhosis, esophageal varices with TIPS in 4/24, permanent A-fib not on anticoagulation, chronic thrombocytopenia, type 2 diabetes, CKD 3B chronic hypotension presented to the ED with abdominal discomfort, shortness of breath and A-fib RVR.  In the ED she was noted to be in A-fib with heart rate in the 130s, tachypneic, BNP 292, creatinine 1.5, troponin 30, 44, platelets less than 30K, hemoglobin 7 - Ultrasound only with small volume ascites, no safe window for paracentesis - CT abdomen pelvis with cirrhosis, bulky chronic varices, increased small volume ascites, abdominal pain [name] with soft tissue density, generalized body wall edema - CTA chest with no PE, small bilateral pleural effusions - Admitted, started on diuretics, cards adjusted meds for A-fib - Worsening anemia, GI consulted plan for EGD and platelet transfusion  Subjective: Feels fair, no events overnight, getting platelet transfusion for EGD today  Assessment and Plan:  Acute on chronic diastolic CHF Moderate aortic stenosis - Last echo 8/25 with EF 60-65, mildly reduced RV, mildly elevated PASP, moderate aortic stenosis - Volume status improving, urine output inaccurate, seen by cards switched to oral torsemide   - GDMT limited by hypotension, on midodrine  at baseline, resumed - Resume Aldactone  tomorrow if blood pressure tolerates  Atrial fibrillation with RVR - Not on anticoagulation secondary to thrombocytopenia/cirrhosis - Currently on metoprolol  and and digoxin , heart rate now improving  NASH/cirrhosis Esophageal varices - Decompensated with volume overload as noted above, - Only small volume ascites on ultrasound History of TIPS in 4/24 - Diuretics as noted above, continue lactulose  and rifaximin   Acute on chronic anemia Iron  deficiency anemia - Recently transfused PRBC about a week ago - Anemia panel with severe deficiency, she denies overt bleeding however has cirrhosis and bulky varices, continue PPI, could be portal hypertensive gastropathy in the setting of cirrhosis - last EGD 8/23 noted small varices then - Hemoglobin down again to 6.8 yesterday, transfused PRBC, [facility] following, EGD today, getting platelets early this a.m. - Add IV iron  Abdominal pain/discomfort - Suspect this is secondary to edema, now improving  AKI on CKD 3a - Likely cardiorenal as well as got contrast in the ER on admission, appears to be stable  ?  Abdominal wall cellulitis - CT mentioned increased soft tissue density and stranding, clinically does not have cellulitis, has a area of increased edema without signs of infection likely related to generalized body edema, procalcitonin was elevated on admission, no signs or symptoms of infection will DC ABX  Thrombocytopenia -due to Cirrhosis  DVT prophylaxis: SCDs Code Status: Full code Family Communication: None present Disposition Plan: Home pending above management  Consultants:    Procedures:   Antimicrobials:    Objective: Vitals:   07/27/24 0714 07/27/24 0732 07/27/24 0902 07/27/24 1002  BP: 115/61 96/71 122/62   Pulse: (!) 120 (!) 106 (!) 118 (!) 120  Resp: 20 16 16    Temp: 98 F (36.7 C) 97.9 F (36.6 C) 98.4 F (36.9 C)   TempSrc:  Oral Oral   SpO2:  98%    Weight:      Height:        Intake/Output Summary (Last 24 hours) at 07/27/2024 1003 Last data filed at 07/27/2024 0902 Gross per 24 hour  Intake 1612.83 ml  Output 1000 ml  Net 612.83 ml   Filed Weights   07/24/24 0107  07/24/24 1559  Weight: 130 kg 131.1 kg    Examination:  General exam: Appears calm and comfortable, AO x 3 ENT: Neck obese unable to assess JVD Respiratory system: Decreased breath sounds at the bases Cardiovascular system: S1 & S2 heard, irregular rhythm Abd:  nondistended, soft and nontender.Normal bowel sounds heard.  Lower abdominal pannus with fluid Central nervous system: Alert and oriented. No focal neurological deficits. Extremities: Trace + edema Skin: No rashes Psychiatry:  Mood & affect appropriate.     Data Reviewed:   CBC: Recent Labs  Lab 07/24/24 0125 07/25/24 0912 07/26/24 0419 07/26/24 1436  WBC 4.2 2.7* 2.6*  --   NEUTROABS 3.7  --   --   --   HGB 7.7* 7.1* 6.8* 8.5*  HCT 25.7* 23.2* 22.5* 27.4*  MCV 90.8 89.6 90.0  --   PLT 24* 20* 25*  --    Basic Metabolic Panel: Recent Labs  Lab 07/24/24 0125 07/25/24 0427 07/26/24 0419  NA 134* 137 135  K 4.0 3.6 4.0  CL 102 103 101  CO2 18* 20* 21*  GLUCOSE 135* 131* 134*  BUN 28* 30* 30*  CREATININE 1.56* 1.73* 1.74*  CALCIUM  8.7* 8.5* 8.3*   GFR: Estimated Creatinine Clearance: 43.7 mL/min (A) (by C-G formula based on SCr of 1.74 mg/dL (H)). Liver Function Tests: Recent Labs  Lab 07/24/24 0125 07/26/24 0419  AST 29 36  ALT 17 17  ALKPHOS 72 59  BILITOT 3.3* 2.9*  PROT 6.8 6.3*  ALBUMIN  3.0* 2.6*   Recent Labs  Lab 07/24/24 1813  LIPASE 45   No results for input(s): AMMONIA in the last 168 hours. Coagulation Profile: No results for input(s): INR, PROTIME in the last 168 hours. Cardiac Enzymes: No results for input(s): CKTOTAL, CKMB, CKMBINDEX, TROPONINI in the last 168 hours. BNP (last 3 results) Recent Labs    02/02/24 0900  PROBNP 1,655.0*   HbA1C: No results for input(s): HGBA1C in the last 72 hours. CBG: Recent Labs  Lab 07/24/24 1642  GLUCAP 185*   Lipid Profile: No results for input(s): CHOL, HDL, LDLCALC, TRIG, CHOLHDL, LDLDIRECT in the last 72 hours. Thyroid  Function Tests: No results for input(s): TSH, T4TOTAL, FREET4, T3FREE, THYROIDAB in the last 72 hours. Anemia Panel: No results for input(s): VITAMINB12, FOLATE, FERRITIN, TIBC, IRON, RETICCTPCT in the last 72 hours. Urine  analysis:    Component Value Date/Time   COLORURINE YELLOW 07/24/2024 0610   APPEARANCEUR HAZY (A) 07/24/2024 0610   LABSPEC 1.027 07/24/2024 0610   PHURINE 5.0 07/24/2024 0610   GLUCOSEU NEGATIVE 07/24/2024 0610   HGBUR NEGATIVE 07/24/2024 0610   BILIRUBINUR NEGATIVE 07/24/2024 0610   KETONESUR NEGATIVE 07/24/2024 0610   PROTEINUR NEGATIVE 07/24/2024 0610   UROBILINOGEN 2.0 (H) 01/31/2015 0055   NITRITE NEGATIVE 07/24/2024 0610   LEUKOCYTESUR NEGATIVE 07/24/2024 0610   Sepsis Labs: @LABRCNTIP (procalcitonin:4,lacticidven:4)  )No results found for this or any previous visit (from the past 240 hours).   Radiology Studies: No results found.    Scheduled Meds:  digoxin   0.125 mg Oral Daily   lactulose   20 g Oral BID   lidocaine   20 mL Intradermal Once   metoprolol  tartrate  25 mg Oral BID   midodrine   10 mg Oral BID WC   pantoprazole  (PROTONIX ) IV  40 mg Intravenous Q12H   rifaximin   550 mg Oral BID   torsemide   40 mg Oral QPM   torsemide   80 mg Oral Daily   Continuous Infusions:  iron sucrose 400  mg (07/26/24 1422)     LOS: 3 days    Time spent:    Sigurd Pac, MD [facility]   07/27/2024, 10:03 AM

## 2024-07-27 NOTE — Anesthesia Procedure Notes (Addendum)
 Procedure Name: Intubation Date/Time: 07/27/2024 1:26 PM  Performed by: Mollie Olivia SAUNDERS, CRNAPre-anesthesia Checklist: Patient identified, Emergency Drugs available, Suction available and Patient being monitored Patient Re-evaluated:Patient Re-evaluated prior to induction Oxygen Delivery Method: Circle system utilized Preoxygenation: Pre-oxygenation with 100% oxygen Induction Type: IV induction Ventilation: Mask ventilation without difficulty Laryngoscope Size: Glidescope and 3 Grade View: Grade II Tube type: Oral Tube size: 7.0 mm Number of attempts: 1 Airway Equipment and Method: Rigid stylet and Video-laryngoscopy Placement Confirmation: ETT inserted through vocal cords under direct vision, positive ETCO2 and breath sounds checked- equal and bilateral Secured at: 22 cm Tube secured with: Tape Dental Injury: Teeth and Oropharynx as per pre-operative assessment  Difficulty Due To: Difficulty was anticipated and Difficult Airway- due to large tongue

## 2024-07-28 DIAGNOSIS — I4891 Unspecified atrial fibrillation: Secondary | ICD-10-CM | POA: Diagnosis not present

## 2024-07-28 LAB — PREPARE PLATELET PHERESIS
Unit division: 0
Unit division: 0
Unit division: 0

## 2024-07-28 LAB — BPAM RBC
Blood Product Expiration Date: 202511272359
Blood Product Expiration Date: 202512032359
Blood Product Expiration Date: 202512052359
ISSUE DATE / TIME: 202511070621
ISSUE DATE / TIME: 202511070955
Unit Type and Rh: 5100
Unit Type and Rh: 5100
Unit Type and Rh: 5100

## 2024-07-28 LAB — TYPE AND SCREEN
ABO/RH(D): O POS
Antibody Screen: NEGATIVE
Unit division: 0
Unit division: 0
Unit division: 0

## 2024-07-28 LAB — BPAM PLATELET PHERESIS
Blood Product Expiration Date: 202511082359
Blood Product Expiration Date: 202511092359
Blood Product Expiration Date: 202511102359
ISSUE DATE / TIME: 202511071605
ISSUE DATE / TIME: 202511080426
ISSUE DATE / TIME: 202511080652
Unit Type and Rh: 5100
Unit Type and Rh: 5100
Unit Type and Rh: 6200

## 2024-07-28 LAB — COMPREHENSIVE METABOLIC PANEL WITH GFR
ALT: 15 U/L (ref 0–44)
AST: 25 U/L (ref 15–41)
Albumin: 2.6 g/dL — ABNORMAL LOW (ref 3.5–5.0)
Alkaline Phosphatase: 61 U/L (ref 38–126)
Anion gap: 12 (ref 5–15)
BUN: 29 mg/dL — ABNORMAL HIGH (ref 8–23)
CO2: 24 mmol/L (ref 22–32)
Calcium: 8.7 mg/dL — ABNORMAL LOW (ref 8.9–10.3)
Chloride: 99 mmol/L (ref 98–111)
Creatinine, Ser: 2.02 mg/dL — ABNORMAL HIGH (ref 0.44–1.00)
GFR, Estimated: 26 mL/min — ABNORMAL LOW (ref >60)
Glucose, Bld: 96 mg/dL (ref 70–99)
Potassium: 3.9 mmol/L (ref 3.5–5.1)
Sodium: 135 mmol/L (ref 135–145)
Total Bilirubin: 3.3 mg/dL — ABNORMAL HIGH (ref 0.0–1.2)
Total Protein: 6.2 g/dL — ABNORMAL LOW (ref 6.5–8.1)

## 2024-07-28 LAB — CBC
HCT: 27.6 % — ABNORMAL LOW (ref 36.0–46.0)
Hemoglobin: 8.5 g/dL — ABNORMAL LOW (ref 12.0–15.0)
MCH: 27.8 pg (ref 26.0–34.0)
MCHC: 30.8 g/dL (ref 30.0–36.0)
MCV: 90.2 fL (ref 80.0–100.0)
Platelets: 25 K/uL — CL (ref 150–400)
RBC: 3.06 MIL/uL — ABNORMAL LOW (ref 3.87–5.11)
RDW: 17.3 % — ABNORMAL HIGH (ref 11.5–15.5)
WBC: 3.1 K/uL — ABNORMAL LOW (ref 4.0–10.5)
nRBC: 0 % (ref 0.0–0.2)

## 2024-07-28 MED ORDER — MIDODRINE HCL 5 MG PO TABS
10.0000 mg | ORAL_TABLET | Freq: Three times a day (TID) | ORAL | Status: DC
  Administered 2024-07-28 – 2024-07-29 (×4): 10 mg via ORAL
  Filled 2024-07-28 (×4): qty 2

## 2024-07-28 NOTE — Progress Notes (Signed)
 PROGRESS NOTE    Hannah Khan  FMW:[id number] DOB: [birth year] DOA: 07/24/2024 PCP: Regino Slater, MD  68/F, chronically ill morbidly obese female with diastolic CHF, NASH/cirrhosis, esophageal varices with TIPS in 4/24, permanent A-fib not on anticoagulation, chronic thrombocytopenia, type 2 diabetes, CKD 3B chronic hypotension presented to the ED with abdominal discomfort, shortness of breath and A-fib RVR.  In the ED she was noted to be in A-fib with heart rate in the 130s, tachypneic, BNP 292, creatinine 1.5, troponin 30, 44, platelets less than 30K, hemoglobin 7 - Ultrasound only with small volume ascites, no safe window for paracentesis - CT abdomen pelvis with cirrhosis, bulky chronic varices, increased small volume ascites, abdominal pain [name] with soft tissue density, generalized body wall edema - CTA chest with no PE, small bilateral pleural effusions - Admitted, started on diuretics, cards adjusted meds for A-fib - Worsening anemia, GI consulted plan for EGD and platelet transfusion Assessment and Plan:  Acute on chronic diastolic CHF Moderate aortic stenosis - Last echo 8/25 with EF 60-65, mildly reduced RV, mildly elevated PASP, moderate aortic stenosis - Volume status improving, urine output inaccurate, seen by cards switched to oral torsemide   - GDMT limited by hypotension, on midodrine  at baseline, resumed - Resume Aldactone  if blood pressure tolerates  Atrial fibrillation with RVR - Not on anticoagulation secondary to thrombocytopenia/cirrhosis - Currently on metoprolol  and and digoxin , heart rate now improving  NASH/cirrhosis Esophageal varices - Decompensated with volume overload as noted above, - Only small volume ascites on ultrasound History of TIPS in 4/24 - Diuretics as noted above, continue lactulose  and rifaximin   Acute on chronic anemia Iron deficiency anemia - Recently transfused PRBC about a week ago - Anemia panel with severe deficiency, she  denies overt bleeding however has cirrhosis and bulky varices, continue PPI, could be portal hypertensive gastropathy in the setting of cirrhosis - last EGD 8/23 noted small varices then - Hemoglobin down again to 6.8 yesterday, transfused PRBC, [facility] following, EGD today, getting platelets early this a.m. - Add IV iron  Abdominal pain/discomfort - Suspect this is secondary to edema, now improving  AKI on CKD 3a - Likely cardiorenal as well as got contrast in the ER on admission, appears to be worsening.  Monitor closely.  ?  Abdominal wall cellulitis - CT mentioned increased soft tissue density and stranding, clinically does not have cellulitis, has a area of increased edema without signs of infection likely related to generalized body edema, procalcitonin was elevated on admission, no signs or symptoms of infection will DC ABX  Thrombocytopenia -due to Cirrhosis  DVT prophylaxis: SCDs Code Status: Full code Family Communication: Family present Disposition Plan: Home pending above management  Consultants:  Cardiology  Objective: Vitals:   07/27/24 2330 07/28/24 0525 07/28/24 0817 07/28/24 1541  BP: 108/61 (!) 101/59 (!) 109/58 (!) 107/49  Pulse: 86 80  78  Resp: 20 20 18 18   Temp: 97.7 F (36.5 C) 97.6 F (36.4 C) 97.7 F (36.5 C) 97.6 F (36.4 C)  TempSrc: Oral Oral Oral Oral  SpO2: 94% 94%  97%  Weight:      Height:        Intake/Output Summary (Last 24 hours) at 07/28/2024 1726 Last data filed at 07/28/2024 0900 Gross per 24 hour  Intake 474 ml  Output 1200 ml  Net -726 ml   Filed Weights   07/24/24 0107 07/24/24 1559  Weight: 130 kg 131.1 kg    Examination:  Clear to auscultation. Bowel  sound present Distended. Trace edema. No asterixis.  Data Reviewed:  Reviewed CBC and BMP. Reordered CBC and BMP.   LOS: 4 days    Time spent: Author: Yetta Blanch, MD  [facility] 07/28/2024  5:27 PM Between 7PM-7AM, please contact  night-coverage, check [email or web address] for on call.

## 2024-07-28 NOTE — Progress Notes (Signed)
 [facility] Progress Note  SUBJECTIVE:   Interval history: Hannah Khan was seen and evaluated today at bedside. Resting in chair at with spouse at bedside. Denied abdominal pain, chest pain, shortness of breath.   Past Medical History:  Diagnosis Date   Anemia    hx of   Aortic stenosis 02/2023   Echo 03/07/23 LVEF 55-60%, mild to moderate AS   Arthritis    knees   CHF (congestive heart failure) (HCC)    chronic diastolic   Diabetes mellitus without complication (HCC)    type 2   Dyspnea    Dysrhythmia    Fibromyalgia    Gastric varices    large   H/O transfusion of platelets    Heart murmur    never has caused any problems   Hx of colonic polyps    s/p partial colectomy   Hyperlipidemia    Hypertension    Joint pain    in knees and back spasms   Left leg swelling    wear compression hose   Liver cirrhosis secondary to NASH (nonalcoholic steatohepatitis) (HCC) dx nov 2014   had enlarged spleen also    Morbid obesity (HCC)    Persistent atrial fibrillation (HCC)    Pneumonia 10/2016   x 5   PONV (postoperative nausea and vomiting)    in past none recent   Portal hypertension (HCC)    Sleep apnea    uses cpap, setting varies between 14-20   Spleen enlarged    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    Past Surgical History:  Procedure Laterality Date   BREAST LUMPECTOMY WITH RADIOACTIVE SEED LOCALIZATION Right 08/29/2017   Procedure: RIGHT BREAST LUMPECTOMY WITH RADIOACTIVE SEEDS LOCALIZATION;  Surgeon: Vanderbilt Ned, MD;  Location: [facility];  Service: General;  Laterality: Right;   CESAREAN SECTION  1989   CHOLECYSTECTOMY  20 yrs ago   COLECTOMY  2011   COLONOSCOPY     COLONOSCOPY N/A 09/05/2018   Procedure: COLONOSCOPY;  Surgeon: Burnette Fallow, MD;  Location: [facility];  Service: Endoscopy;  Laterality: N/A;   COLONOSCOPY WITH PROPOFOL  Bilateral 05/04/2022   Procedure: COLONOSCOPY WITH PROPOFOL ;  Surgeon: Burnette Fallow, MD;  Location: [facility];  Service: Gastroenterology;  Laterality: Bilateral;   DILATATION & CURETTAGE/HYSTEROSCOPY WITH MYOSURE N/A 05/06/2016   Procedure: DILATATION & CURETTAGE/HYSTEROSCOPY WITH MYOSURE WITH POLYPECTOMY;  Surgeon: Delon Prude, DO;  Location: [facility];  Service: Gynecology;  Laterality: N/A;   ESOPHAGOGASTRODUODENOSCOPY (EGD) WITH PROPOFOL  N/A 09/04/2013   Procedure: ESOPHAGOGASTRODUODENOSCOPY (EGD) WITH PROPOFOL ;  Surgeon: Fallow Burnette, MD;  Location: [facility];  Service: Endoscopy;  Laterality: N/A;   ESOPHAGOGASTRODUODENOSCOPY (EGD) WITH PROPOFOL  Bilateral 05/04/2022   Procedure: ESOPHAGOGASTRODUODENOSCOPY (EGD) WITH PROPOFOL ;  Surgeon: Burnette Fallow, MD;  Location: [facility];  Service: Gastroenterology;  Laterality: Bilateral;   IR EMBO ART  VEN HEMORR LYMPH EXTRAV  INC GUIDE ROADMAPPING  06/30/2023   IR INTRAVASCULAR ULTRASOUND NON CORONARY  12/23/2022   IR RADIOLOGIST EVAL & MGMT  09/21/2022   IR RADIOLOGIST EVAL & MGMT  10/06/2022   IR RADIOLOGIST EVAL & MGMT  11/02/2022   IR RADIOLOGIST EVAL & MGMT  01/31/2023   IR RADIOLOGIST EVAL & MGMT  05/31/2023   IR RADIOLOGIST EVAL & MGMT  08/21/2023   IR RADIOLOGIST EVAL & MGMT  12/25/2023   IR TIPS  12/23/2022   IR TIPS  06/30/2023   IR US  GUIDE VASC ACCESS LEFT  06/30/2023   IR US  GUIDE VASC ACCESS RIGHT  12/23/2022  IR US  GUIDE VASC ACCESS RIGHT  06/30/2023   IR US  GUIDE VASC ACCESS RIGHT  06/30/2023   KNEE ARTHROSCOPY  yrs ago   bilateral, one done x 1, one done twice   POLYPECTOMY  09/05/2018   Procedure: POLYPECTOMY;  Surgeon: Burnette Fallow, MD;  Location: [facility];  Service: Endoscopy;;   RADIOLOGY WITH ANESTHESIA N/A 12/23/2022   Procedure: TIPS;  Surgeon: Jennefer Ester PARAS, MD;  Location: [facility];  Service: Radiology;  Laterality: N/A;   RIGHT HEART CATH N/A 05/19/2023   Procedure: RIGHT HEART CATH;  Surgeon: Gardenia Led, DO;  [facility]: [facility];  Service: Cardiovascular;  Laterality: N/A;   TIPS PROCEDURE N/A  06/30/2023   Procedure: TRANS-JUGULAR INTRAHEPATIC PORTAL SHUNT (TIPS);  Surgeon: Jennefer Ester PARAS, MD;  Location: [facility] OR;  Service: Radiology;  Laterality: N/A;   Current Facility-Administered Medications  Medication Dose Route Frequency Provider Last Rate Last Admin   digoxin  (LANOXIN ) tablet 0.125 mg  0.125 mg Oral Daily Lonni Slain, MD   0.125 mg at 07/28/24 1006   lactulose  (CHRONULAC ) 10 GM/15ML solution 20 g  20 g Oral BID Fairy Frames, MD   20 g at 07/28/24 1007   lidocaine  (XYLOCAINE ) 1 % (with pres) injection 20 mL  20 mL Intradermal Once Jenna Cordella LABOR, MD       methocarbamol  (ROBAXIN ) tablet 750 mg  750 mg Oral Q8H PRN Moore, Willie, MD   750 mg at 07/27/24 2038   metoprolol  tartrate (LOPRESSOR ) tablet 25 mg  25 mg Oral BID Lonni Slain, MD   25 mg at 07/28/24 1006   midodrine  (PROAMATINE ) tablet 10 mg  10 mg Oral BID WC Joseph, Preetha, MD   10 mg at 07/28/24 9162   pantoprazole  (PROTONIX ) injection 40 mg  40 mg Intravenous Q12H Moore, Willie, MD   40 mg at 07/28/24 1007   phenol (CHLORASEPTIC) mouth spray 1 spray  1 spray Mouth/Throat PRN Fairy Frames, MD       rifaximin  (XIFAXAN ) tablet 550 mg  550 mg Oral BID Moore, Willie, MD   550 mg at 07/28/24 1006   torsemide  (DEMADEX ) tablet 40 mg  40 mg Oral QPM Adams, Zane, PA-C   40 mg at 07/27/24 1724   torsemide  (DEMADEX ) tablet 80 mg  80 mg Oral Daily Adams, Zane, PA-C   80 mg at 07/28/24 1007   Allergies as of 07/24/2024 - Review Complete 07/24/2024  Allergen Reaction Noted   Celebrex [celecoxib] Other (See Comments) 12/08/2015   Shellfish allergy Swelling, Rash, and Other (See Comments) 08/02/2014   Tomato Anaphylaxis 06/11/2024   Barium-containing compounds Other (See Comments) 06/06/2017   Fentanyl  Other (See Comments) 04/26/2018   Nsaids Other (See Comments) 04/19/2024   Prednisone Other (See Comments) 12/08/2015   Sglt2 inhibitors Other (See Comments) 03/07/2023   Statins Other (See Comments)  02/03/2011   Tylenol  [acetaminophen ] Other (See Comments) 05/02/2016   Ibuprofen  Other (See Comments) 05/02/2016   Tramadol  Other (See Comments) 09/07/2018   Zetia [ezetimibe] Other (See Comments) 03/29/2021   Review of Systems:  Review of Systems  Respiratory:  Negative for shortness of breath.   Cardiovascular:  Negative for chest pain.  Gastrointestinal:  Negative for abdominal pain, nausea and vomiting.    OBJECTIVE:   Temp:  [97.5 F (36.4 C)-98.5 F (36.9 C)] 97.7 F (36.5 C) (11/09 0817) Pulse Rate:  [80-117] 80 (11/09 0525) Resp:  [15-20] 18 (11/09 0817) BP: (99-124)/(48-68) 109/58 (11/09 0817) SpO2:  [92 %-99 %] 94 % (  11/09 0525) Last BM Date : 07/28/24 Physical Exam Constitutional:      General: She is not in acute distress.    Appearance: She is obese. She is not ill-appearing, toxic-appearing or diaphoretic.  Cardiovascular:     Rate and Rhythm: Rhythm irregular.     Heart sounds: Murmur heard.  Pulmonary:     Effort: No respiratory distress.  Abdominal:     General: Bowel sounds are normal. There is no distension.     Palpations: Abdomen is soft.     Tenderness: There is no abdominal tenderness. There is no guarding.  Musculoskeletal:     Right lower leg: Edema present.     Left lower leg: Edema present.  Skin:    General: Skin is warm and dry.  Neurological:     Mental Status: She is alert.     Labs: Recent Labs    07/27/24 0945 07/27/24 1043 07/28/24 0358  WBC 3.3* 4.2 3.1*  HGB 8.7* 8.7* 8.5*  HCT 28.1* 27.7* 27.6*  PLT 25* 27* 25*   BMET Recent Labs    07/26/24 0419 07/27/24 0945 07/28/24 0358  NA 135 138 135  K 4.0 3.1* 3.9  CL 101 101 99  CO2 21* 23 24  GLUCOSE 134* 118* 96  BUN 30* 26* 29*  CREATININE 1.74* 1.60* 2.02*  CALCIUM  8.3* 8.6* 8.7*   LFT Recent Labs    07/28/24 0358  PROT 6.2*  ALBUMIN  2.6*  AST 25  ALT 15  ALKPHOS 61  BILITOT 3.3*   PT/INR No results for input(s): LABPROT, INR in the last 72  hours. Diagnostic imaging: No results found.  IMPRESSION: Metabolic associated cirrhosis decompensated by esophageal and gastric varix             - History of hepatic encephalopathy             - Status post BRTO October 2024             - TIPS attempt October 2024 unsuccessful Portal hypertensive gastropathy Acute on chronic anemia Thrombocytopenia  Congestive heart failure Aortic stenosis  PLAN: -IV iron and beta blocker therapy (as able) for treatment of portal hypertensive gastropathy -No current signs of active GI bleeding -Based on EGD 07/27/24, esophageal/gastric varix (graded as GOV2) may be better suited for endoscopic glue injection versus EUS guided coil embolization versus repeat TIPS attempt  -Per review of Dr. Jennefer note from April 2025, there is/was possible target for TIPS on most recent imaging, plan was for patient to follow up with IR in June 2025 after meeting with [facility], she has not yet followed up with IR, I recommended that she do so -She reported having a follow up appointment with [facility] in January 2026, may recommend earlier follow up given her admission  -No further endoscopic intervention planned -Ok for discharge from GI perspective once stable from IM   LOS: 4 days   Estefana Keas, DO [facility]

## 2024-07-28 NOTE — Plan of Care (Signed)

## 2024-07-28 NOTE — Plan of Care (Signed)
  Problem: Clinical Measurements: Goal: Cardiovascular complication will be avoided Outcome: Progressing   Problem: Health Behavior/Discharge Planning: Goal: Ability to manage health-related needs will improve Outcome: Progressing   Problem: Activity: Goal: Risk for activity intolerance will decrease Outcome: Progressing

## 2024-07-29 ENCOUNTER — Encounter (HOSPITAL_COMMUNITY): Payer: Self-pay | Admitting: Internal Medicine

## 2024-07-29 DIAGNOSIS — I4891 Unspecified atrial fibrillation: Secondary | ICD-10-CM | POA: Diagnosis not present

## 2024-07-29 LAB — RENAL FUNCTION PANEL
Albumin: 2.6 g/dL — ABNORMAL LOW (ref 3.5–5.0)
Anion gap: 10 (ref 5–15)
BUN: 30 mg/dL — ABNORMAL HIGH (ref 8–23)
CO2: 27 mmol/L (ref 22–32)
Calcium: 8.8 mg/dL — ABNORMAL LOW (ref 8.9–10.3)
Chloride: 101 mmol/L (ref 98–111)
Creatinine, Ser: 1.85 mg/dL — ABNORMAL HIGH (ref 0.44–1.00)
GFR, Estimated: 29 mL/min — ABNORMAL LOW (ref >60)
Glucose, Bld: 145 mg/dL — ABNORMAL HIGH (ref 70–99)
Phosphorus: 3.1 mg/dL (ref 2.5–4.6)
Potassium: 3.5 mmol/L (ref 3.5–5.1)
Sodium: 138 mmol/L (ref 135–145)

## 2024-07-29 LAB — CBC
HCT: 28.4 % — ABNORMAL LOW (ref 36.0–46.0)
Hemoglobin: 8.6 g/dL — ABNORMAL LOW (ref 12.0–15.0)
MCH: 27.3 pg (ref 26.0–34.0)
MCHC: 30.3 g/dL (ref 30.0–36.0)
MCV: 90.2 fL (ref 80.0–100.0)
Platelets: 33 K/uL — ABNORMAL LOW (ref 150–400)
RBC: 3.15 MIL/uL — ABNORMAL LOW (ref 3.87–5.11)
RDW: 17.8 % — ABNORMAL HIGH (ref 11.5–15.5)
WBC: 3.3 K/uL — ABNORMAL LOW (ref 4.0–10.5)
nRBC: 0 % (ref 0.0–0.2)

## 2024-07-29 LAB — MAGNESIUM: Magnesium: 1.7 mg/dL (ref 1.7–2.4)

## 2024-07-29 NOTE — Progress Notes (Signed)
   07/29/24 1214  Spiritual Encounters  Type of Visit Initial  Care provided to: Pt and family  Reason for visit Routine spiritual support  OnCall Visit No  Spiritual Framework  Presenting Themes Impactful experiences and emotions  Community/Connection Family  Patient Stress Factors Health changes;Exhausted  Family Stress Factors Exhausted;Health changes  Interventions  Spiritual Care Interventions Made Established relationship of care and support;Compassionate presence;Prayer  Intervention Outcomes  Outcomes Awareness of health;Awareness of support;Reduced anxiety;Reduced fear   Chaplain with the Pt, whose husband was present in the room. The Pt began to share her struggles growing up, including family members who faced health challenges. She also reflected on her own health concerns and expressed that she is at peace if God choose to take her life.  Pt shared stories of how God has provided and made a way in difficult time. Chaplain offered active listening, spiritual and emotional support, and affirmed the pt's faith and resilience, per the PT request offered a word a prayer.

## 2024-07-29 NOTE — TOC Initial Note (Signed)
 Transition of Care [facility]) - Initial/Assessment Note    Patient Details  Name: Hannah Khan MRN: [id number] Date of Birth: [birth year]  Transition of Care [facility]) CM/SW Contact:    Sudie Erminio Deems, RN Phone Number: 07/29/2024, 4:20 PM  Clinical Narrative: Patient presented for Atrial Fib with RVR- PTA patient was from home with spouse. Patient has DME: cane, rollator, BSC, and shower chair. Patient is not active with [facility] services. Patient has PCP and insurance. No home needs identified at this time. Spouse will transport home via private vehicle. No further needs identified at this time.            Expected Discharge Plan: Home/Self Care Barriers to Discharge: No Barriers Identified   Patient Goals and CMS Choice Patient states their goals for this hospitalization and ongoing recovery are:: Plan to return home once stable          Expected Discharge Plan and Services In-house Referral: NA Discharge Planning Services: CM Consult Post Acute Care Choice: NA Living arrangements for the past 2 months: Single Family Home Expected Discharge Date: 07/29/24                 DME Agency: NA       HH Arranged: NA          Prior Living Arrangements/Services Living arrangements for the past 2 months: Single Family Home Lives with:: Spouse Patient language and need for interpreter reviewed:: Yes Do you feel safe going back to the place where you live?: Yes      Need for Family Participation in Patient Care: Yes (Comment) Care giver support system in place?: Yes (comment) Current home services: DME (cane, Rollator, BSC, shower chair) Criminal Activity/Legal Involvement Pertinent to Current Situation/Hospitalization: No - Comment as needed  Activities of Daily Living   ADL Screening (condition at time of admission) Independently performs ADLs?: Yes (appropriate for developmental age) Is the patient deaf or have difficulty hearing?: No Does the patient have difficulty  seeing, even when wearing glasses/contacts?: Yes (macular degeneration bilateral) Does the patient have difficulty concentrating, remembering, or making decisions?: No  Permission Sought/Granted Permission sought to share information with : Family Supports, Case Manager                Emotional Assessment Appearance:: Appears stated age, Malodorous Attitude/Demeanor/Rapport: Engaged Affect (typically observed): Appropriate Orientation: : Oriented to Self, Oriented to Place, Oriented to  Time Alcohol  / Substance Use: Not Applicable Psych Involvement: No (comment)  Admission diagnosis:  Generalized abdominal pain [R10.84] Chronic a-fib (HCC) [I48.20] Atrial fibrillation with RVR (HCC) [I48.91] Chronic atrial fibrillation with RVR (HCC) [I48.20] Patient Active Problem List   Diagnosis Date Noted   Chronic diastolic heart failure (HCC) 07/25/2024   Generalized abdominal pain 07/25/2024   Esophageal and gastric varices (HCC) 06/14/2024   Symptomatic anemia 06/10/2024   Cellulitis of left lower extremity 04/22/2024   Staphylococcus epidermidis bacteremia 04/21/2024   CHF (congestive heart failure) (HCC) 02/02/2024   Hypoxia 07/04/2023   Nausea & vomiting 07/04/2023   Nausea and vomiting 07/03/2023   Intractable nausea and vomiting 04/07/2023   Intractable vomiting with nausea 04/06/2023   Acute on chronic diastolic heart failure (HCC) 03/06/2023   Chronic hypotension 03/06/2023   Sepsis due to cellulitis (HCC) 03/05/2023   Severe sepsis (HCC) 03/05/2023   Portal hypertension (HCC) 12/23/2022   Acute encephalopathy 01/10/2021   Secondary hypercoagulable state 06/23/2020   Streptococcal bacteremia 03/16/2020   Chronic venous stasis dermatitis of both lower extremities  03/16/2020   Atrial fibrillation with RVR (HCC) 04/26/2018   Acute lower UTI 04/26/2018   Liver cirrhosis secondary to NASH (HCC) 04/26/2018   Hyperlipidemia 04/26/2018   Type 2 diabetes mellitus (HCC)     Cellulitis of right lower extremity 03/15/2018   Sepsis (HCC) 03/14/2018   Community acquired pneumonia of right lower lobe of lung 10/31/2016   Muscular abdominal pain in right upper quadrant 07/11/2016   Chronic heart failure with preserved ejection fraction (HFpEF) (HCC) 07/10/2016   Thrombocytopenia 07/10/2016   Permanent atrial fibrillation (HCC) 01/30/2016   Morbid obesity- 12/10/2015   Cellulitis, abdominal wall    Melena 01/31/2015   Aortic heart murmur 01/31/2015   Anemia 07/23/2013   Obstructive sleep apnea 08/07/2007   Essential hypertension 08/07/2007   Allergic rhinitis 08/07/2007   PCP:  Regino Slater, MD Pharmacy:   [id number] - [place], [place] - [address]  [address] Phone: [phone] Fax: [phone]  [place] [facility] [address] [place] [address] Phone: [phone] Fax: [phone]     Social Drivers of Health (SDOH) Social History: SDOH Screenings   Food Insecurity: No Food Insecurity (07/24/2024)  Housing: Low Risk  (07/24/2024)  Transportation Needs: No Transportation Needs (07/24/2024)  Utilities: Not At Risk (07/24/2024)  Depression (PHQ2-9): Low Risk  (07/17/2024)  Social Connections: Socially Integrated (07/24/2024)  Tobacco Use: Medium Risk (07/27/2024)   SDOH Interventions:     Readmission Risk Interventions    06/13/2024   11:57 AM 04/21/2024    2:22 PM 04/08/2023    9:42 AM  Readmission Risk Prevention Plan  Transportation Screening Complete Complete Complete  PCP or Specialist Appt within 3-5 Days   Complete  HRI or Home Care Consult   Complete  Social Work Consult for Recovery Care Planning/Counseling   Complete  Palliative Care Screening   Not Applicable  Medication Review Oceanographer) Complete Complete Complete  PCP or Specialist appointment within 3-5 days of discharge Complete Complete   HRI or Home Care Consult Complete Complete   SW  Recovery Care/Counseling Consult Complete Complete   Palliative Care Screening Not Applicable Not Applicable   Skilled Nursing Facility Not Applicable Not Applicable

## 2024-07-29 NOTE — Care Management Important Message (Signed)
 Important Message  Patient Details  Name: Hannah Khan MRN: [id number] Date of Birth: 06-17-1956   Important Message Given:  Yes - Medicare IM     Vonzell Arrie Sharps 07/29/2024, 10:46 AM

## 2024-07-30 ENCOUNTER — Other Ambulatory Visit: Payer: Self-pay | Admitting: *Deleted

## 2024-07-30 DIAGNOSIS — D696 Thrombocytopenia, unspecified: Secondary | ICD-10-CM

## 2024-07-30 NOTE — Discharge Summary (Signed)
 Physician Discharge Summary   Patient: Hannah Khan MRN: [id number] DOB: 1955/11/27  Admit date:     07/24/2024  Discharge date: 07/29/2024  Discharge Physician: Yetta Blanch  PCP: Regino Slater, MD  Recommendations at discharge: Follow-up with PCP in 1 week. Follow-up with cardiology as recommended. Repeat BMP in 1 week.   Follow-up Information     Vannie Reche GORMAN, NP Follow up.   Specialty: Cardiology Why: Hospital follow-up scheduled for 08/12/2024 at 10:55am. Please arrive 15 minutes early for check-in. If this date/ time does not work for you, please call our office to reschedule. Contact information: [address] [place] [address] [phone]         [facility] Follow up on 08/05/2024.   Specialty: Cardiology Why: at 9am [facility], Entrance C Free Valet Parking Available Contact information: [address] [place]  [address] [phone]        Jennefer Ester PARAS, MD. Schedule an appointment as soon as possible for a visit in 1 week(s).   Specialties: Interventional Radiology, Diagnostic Radiology, Radiology Why: To discuss intervention for treatment of varices. Contact information: [address] [place] [address] [phone]                Hospital Course: 68/F, chronically ill morbidly obese female with diastolic CHF, NASH/cirrhosis, esophageal varices with TIPS in 4/24, permanent A-fib not on anticoagulation, chronic thrombocytopenia, type 2 diabetes, CKD 3B chronic hypotension presented to the ED with abdominal discomfort, shortness of breath and A-fib RVR.  In the ED she was noted to be in A-fib with heart rate in the 130s, tachypneic, BNP 292, creatinine 1.5, troponin 30, 44, platelets less than 30K, hemoglobin 7 - Ultrasound only with small volume ascites, no safe window for paracentesis - CT abdomen pelvis  with cirrhosis, bulky chronic varices, increased small volume ascites, abdominal pain [name] with soft tissue density, generalized body wall edema - CTA chest with no PE, small bilateral pleural effusions Treated for acute on chronic diastolic CHF, paroxysmal A-fib with RVR and anemia.  Assessment and Plan: Acute on chronic diastolic CHF Moderate aortic stenosis Last echo 8/25 with EF 60-65, mildly reduced RV, mildly elevated PASP, moderate aortic stenosis Volume status improving, urine output inaccurate, seen by cards switched to oral torsemide   GDMT limited by hypotension, on midodrine  at baseline, resumed Resume Aldactone .   Paroxysmal atrial fibrillation with RVR Not on anticoagulation secondary to thrombocytopenia/cirrhosis Currently on metoprolol  and and digoxin , heart rate now improving   NASH/cirrhosis Esophageal varices Decompensated with volume overload as noted above, Only small volume ascites on ultrasound History of TIPS in 4/24 Diuretics as noted above, continue lactulose  and rifaximin    Acute on chronic anemia Iron deficiency anemia Recently transfused PRBC about a week ago Anemia panel with severe deficiency, she denies overt bleeding however has cirrhosis and bulky varices, continue PPI, could be portal hypertensive gastropathy in the setting of cirrhosis last EGD 8/23 noted small varices then Hemoglobin went down to 6.8.  Required 1 PRBC transfusion. Medical GI was consulted. EGD no evidence of active bleeding. Continue PPI twice daily.   Abdominal pain/discomfort Suspect this is secondary to edema, now improving   AKI on CKD 3a Likely cardiorenal as well as got contrast in the ER on admission, appears to be worsening.  Monitor closely.   Abdominal wall thickening on CT scan. CT mentioned increased soft tissue density and stranding, clinically  does not have cellulitis, has a area of increased edema without signs of infection likely related to generalized  body edema, procalcitonin was elevated on admission, no signs or symptoms of infection will DC ABX   Thrombocytopenia due to Cirrhosis Stable.  Obesity Class 3 Body mass index is 45.26 kg/m.  Placing the pt at higher risk of poor outcomes.  Consultants:  Cardiology  Gastroenterology  Procedures performed:  Echocardiogram  EGD  DISCHARGE MEDICATION: Allergies as of 07/29/2024       Reactions   Celebrex [celecoxib] Other (See Comments)   speech slurred with celebrex   Shellfish Allergy Swelling, Rash, Other (See Comments)   TINGLING AND SWELLING OF LIPS. LARGE WHELPS, QUARTER-SIZE   Tomato Anaphylaxis   Pt cannot swallow due to blisters that form inside throat resulting in swelling.    Barium-containing Compounds Other (See Comments)   TACHYCARDIA   Fentanyl  Other (See Comments)   Hallucinations    Nsaids Other (See Comments)   Contraindication due to CKD stage 3    Prednisone Other (See Comments)   TACHYCARDIA   Sglt2 Inhibitors Other (See Comments)   Ozempic , Severe gastroparesis    Statins Other (See Comments)   AGGRAVATED FIBROMYALGIA   Tylenol  [acetaminophen ] Other (See Comments)   UNSPECIFIED SPECIFIC REACTION >> DUE TO PLATELETS   Ibuprofen  Other (See Comments)   UNSPECIFIED SPECIFIC REACTION >> DUE TO PLATELETS   Tramadol  Other (See Comments)   Hallucination   Zetia [ezetimibe] Other (See Comments)   Headache        Medication List     TAKE these medications    albuterol  108 (90 Base) MCG/ACT inhaler Commonly known as: VENTOLIN  HFA Inhale 1-2 puffs into the lungs every 6 (six) hours as needed for wheezing or shortness of breath.   digoxin  0.125 MG tablet Commonly known as: LANOXIN  TAKE 1 TABLET BY MOUTH EVERY DAY   lactulose  10 GM/15ML solution Commonly known as: CHRONULAC  Take 20 g by mouth 3 (three) times daily.   methocarbamol  750 MG tablet Commonly known as: ROBAXIN  Take 1 tablet (750 mg total) by mouth every 8 (eight) hours as  needed for muscle spasms.   metolazone  2.5 MG tablet Commonly known as: ZAROXOLYN  Take 1 tablet (2.5 mg total) by mouth as needed.   metoprolol  tartrate 25 MG tablet Commonly known as: LOPRESSOR  Take 25 mg by mouth 2 (two) times daily.   midodrine  10 MG tablet Commonly known as: PROAMATINE  Take 1.5 tablets (15 mg total) by mouth 3 (three) times daily.   pantoprazole  40 MG tablet Commonly known as: PROTONIX  Take 40 mg by mouth 2 (two) times daily.   potassium chloride  10 MEQ CR capsule Commonly known as: MICRO-K  Take 4 capsules (40 mEq total) by mouth 2 (two) times daily. Take an extra 4 tablets when taking Metolazone .   PreserVision AREDS 2 Caps Take 1 capsule by mouth daily.   spironolactone  25 MG tablet Commonly known as: ALDACTONE  TAKE 3 TABLETS BY MOUTH DAILY   Systane Balance 0.6 % Soln Generic drug: Propylene Glycol Place 1 drop into both eyes 2 (two) times daily.   torsemide  20 MG tablet Commonly known as: DEMADEX  Take 4 tablets (80 mg total) by mouth in the morning and 2 tablets (40 mg total) in the evening   Tresiba  FlexTouch 100 UNIT/ML FlexTouch Pen Generic drug: insulin  degludec Inject 36 Units into the skin daily. (TITRATE WHEN DIRECTED UP TO 50 UNITS)   Xifaxan  550 MG Tabs tablet Generic drug: rifaximin  Take 550  mg by mouth 2 (two) times daily.       Disposition: Home Diet recommendation: Cardiac diet  Discharge Exam: Vitals:   07/29/24 0743 07/29/24 0744 07/29/24 0918 07/29/24 1556  BP: (!) 118/59  (!) 117/58 111/61  Pulse: 95  (!) 103   Resp:      Temp:  98.8 F (37.1 C)  98 F (36.7 C)  TempSrc:  (P) Oral  Oral  SpO2: 100%   98%  Weight:      Height:       General: in Mild distress, No Rash Cardiovascular: S1 and S2 Present, No Murmur Respiratory: Good respiratory effort, Bilateral Air entry present. Bilateral basal Crackles, No wheezes Abdomen: Bowel Sound present, No tenderness Extremities: No edema Neuro: Alert and oriented x3,  no new focal deficit   Filed Weights   07/24/24 0107 07/24/24 1559  Weight: 130 kg 131.1 kg   Condition at discharge: stable  The results of significant diagnostics from this hospitalization (including imaging, microbiology, ancillary and laboratory) are listed below for reference.   Imaging Studies: IR ABDOMEN US  LIMITED Result Date: 07/24/2024 CLINICAL DATA:  The patient with history of NASH cirrhosis, presenting to ED with abdominal discomfort. CT AP w/ CM today demonstrating small volume ascites. IR was requested for diagnostic and therapeutic paracentesis. Patient with history of GI bleed requiring transfusions, and thrombocytopenia. Platelets 24 K today. EXAM: LIMITED ABDOMEN ULTRASOUND FOR ASCITES TECHNIQUE: Limited ultrasound survey for ascites was performed in all four abdominal quadrants. COMPARISON:  CT AP w/ CM today (07/24/24). FINDINGS: Small volume ascites with no safe window to proceed in the setting of severe thrombocytopenia. IMPRESSION: Paracentesis deferred due to small volume ascites without safe window for paracentesis, in the setting of severe thrombocytopenia. Electronically Signed   By: Cordella Banner   On: 07/24/2024 11:28   CT ABDOMEN PELVIS W CONTRAST Result Date: 07/24/2024 EXAM: CT ABDOMEN AND PELVIS WITH CONTRAST 07/24/2024 04:30:19 AM TECHNIQUE: CT of the abdomen and pelvis was performed with the administration of 75 mL of iohexol  (OMNIPAQUE ) 350 MG/ML injection. Multiplanar reformatted images are provided for review. Automated exposure control, iterative reconstruction, and/or weight-based adjustment of the mA/kV was utilized to reduce the radiation dose to as low as reasonably achievable. COMPARISON: CTA chest 07/24/2024 (reported separately). CT abdomen and pelvis 11/24/2023. CLINICAL HISTORY: 68 year old female. Shortness of breath, chest pain, abdominal pain. FINDINGS: LOWER CHEST: Small chronic pleural effusions as reported on the chest CTA separately, these are  stable along with cardiomegaly since 11/24/2023. LIVER: Cirrhotic liver. Highly nodular liver contour with no discrete liver lesion identified. Small volume ascites is present adjacent to the liver. GALLBLADDER AND BILE DUCTS: Chronic cholecystectomy. No biliary ductal dilatation. SPLEEN: Massive splenomegaly without focal lesion (estimated volume 2169 mL vsnormal splenic volume range 83 - 412 mL). No discrete splenic lesion. Chronic embolization coils in the left upper quadrant, reportedly previous embolized gastrorenal shunt. Small volume ascites is present adjacent to the spleen. PANCREAS: No acute abnormality. ADRENAL GLANDS: No acute abnormality. KIDNEYS, URETERS AND BLADDER: No stones in the kidneys or ureters. No hydronephrosis. No perinephric or periureteral stranding. Diminutive bladder. GI AND BOWEL: Diminutive stomach. Nondilated large and small bowel loops. Chronic postoperative changes to the right colon are stable. No pneumoperitoneum identified. PERITONEUM AND RETROPERITONEUM: Small volume ascites in the abdomen, most conspicuous adjacent to the liver and spleen, is new since 11/24/2023. Small volume of ascites in the lower abdomen and at the pelvic inlet also. No free air. VASCULATURE: Bulky  paraesophageal and gastrohepatic venous varices persist (such as on series 5 image 29). Suboptimal intravascular contrast but the major vascular structures in the abdomen and pelvis appear to be enhancing and patent, including bulky varices in the upper abdomen and also the right lower quadrant. Calcified aortic atherosclerosis. Aorta is normal in caliber. There is chronic cavernous transformation of the main portal vein. LYMPH NODES: No lymphadenopathy. REPRODUCTIVE ORGANS: Stable IUD, positioning somewhat oblique as before. BONES AND SOFT TISSUES: Lower thoracic spine hyperostosis and intermittent ankylosis. No acute or suspicious osseous lesion. Large abdominal panniculus with confluent increased subcutaneous  soft tissue density and stranding laterally on the left (series 5 image 73). Underlying generalized mild body wall edema elsewhere appears more symmetric. No soft tissue gas, organized or drainable fluid collection identified in the involved abdominal wall. Presacral edema is chronic but increased. IMPRESSION: 1. Cirrhosis with bulky chronic varices in the abdomen (including paraesophageal). New or increased small-volume ascites since March. 2. Large abdominal panniculus with confluent increased subcutaneous soft tissue density and stranding laterally on the left (series 5 image 73). Whereas elsewhere generalized body wall edema appears more symmetric. Query abdominal wall Cellulitis vs hematoma / contusion. No organized or drainable fluid collection. 3. Small chronic pleural effusions, CTA chest today reported separately. Electronically signed by: Helayne Hurst MD 07/24/2024 04:50 AM EST RP Workstation: [id number]   CT Angio Chest Pulmonary Embolism (PE) W or WO Contrast Result Date: 07/24/2024 EXAM: LUNG CANCER SCREENING CHEST CT WITH AND WITHOUT CONTRAST 07/24/2024 04:30:19 AM TECHNIQUE: Low-dose CT of the chest was performed with and without the administration of intravenous contrast. 75 mL of iohexol  (OMNIPAQUE ) 350 MG/ML injection was administered. Multiplanar reformatted images are provided for review. Automated exposure control, iterative reconstruction, and/or weight based adjustment of the mA/kV was utilized to reduce the radiation dose to as low as reasonably achievable. COMPARISON: CTA chest 01/10/2021. CLINICAL HISTORY: 68 year old female. Screening. FINDINGS: MEDIASTINUM: Heart and pericardium: Chronic cardiomegaly is moderate to severe. No pericardial effusion. Vessels: Aberrant origin of the right subclavian artery, normal variant. Calcified aortic atherosclerosis. Suboptimal but adequate central pulmonary artery contrast timing. However, respiratory motion limits evaluation of the bilateral  pulmonary segmental and distal branches. No central or lobar pulmonary artery filling defect. Distal branches are not well evaluated. Airways: The central airways are clear. LYMPH NODES: No mediastinal, hilar or axillary lymphadenopathy. LUNGS AND PLEURA: Lungs: Mild respiratory motion. Generalized mild to moderate atelectatic changes of the major airways which remain patent. Bilateral lower lobe compressive atelectasis, similar to prior exam. Mosaic lung attenuation elsewhere, likely due to combined mild atelectasis and gas trapping. No focal consolidation or pulmonary edema. No suspicious pulmonary nodules. Pleura: Small layering pleural effusions, mildly decreased compared to the prior exam. No pneumothorax. SOFT TISSUES AND BONES: Bones: Thoracic spine hyperostosis with flowing endplate osteophytes resulting in intermittent interbody ankylosis. Soft tissues: Benign appearing rim calcified cyst in the right breast on series 3 image 104 is incidental. UPPER ABDOMEN: CT abdomen and pelvis today is reported separately. IMPRESSION: 1. No central or lobar pulmonary embolism identified; distal branches are limited by motion and contrast timing. 2. Small bilateral pleural effusions, pulmonary atelectasis. Pronounced chronic cardiomegaly. Aortic atherosclerosis. 3. No acute or inflammatory process identified in the chest. 4. CT abdomen and pelvis reported separately. Electronically signed by: Helayne Hurst MD 07/24/2024 04:38 AM EST RP Workstation: [id number]   DG Chest Port 1 View Result Date: 07/24/2024 EXAM: 1 VIEW(S) XRAY OF THE CHEST 07/24/2024 01:28:00 AM COMPARISON: 04/21/2024 CLINICAL HISTORY:  short of breath, palpitations FINDINGS: LUNGS AND PLEURA: Left lower lobe opacity, likely atelectasis. Vascular congestion. No pleural effusion. No pneumothorax. HEART AND MEDIASTINUM: Cardiomegaly. Aortic atherosclerosis. BONES AND SOFT TISSUES: No acute osseous abnormality. IMPRESSION: 1. Cardiomegaly and vascular  congestion. 2. Left lower lobe opacity, likely atelectasis. 3. Aortic atherosclerosis. Electronically signed by: Franky Crease MD 07/24/2024 01:35 AM EST RP Workstation: [id number]    Microbiology: Results for orders placed or performed in visit on 05/29/24  Blood culture (routine single)     Status: None   Collection Time: 05/29/24  1:24 PM   Specimen: Blood  Result Value Ref Range Status   MICRO NUMBER: [id number]  Final   SPECIMEN QUALITY: Suboptimal  Final   Source BLOOD 1  Final   STATUS: FINAL  Final   Result:   Final    No growth after 5 days Inspection of blood culture bottles indicates that an inadequate volume of blood may have been collected for the detection of sepsis.   COMMENT: Aerobic and anaerobic bottle received.  Final  Blood culture (routine single)     Status: None   Collection Time: 05/29/24  1:29 PM   Specimen: Blood  Result Value Ref Range Status   MICRO NUMBER: [id number]  Final   SPECIMEN QUALITY: Adequate  Final   Source BLOOD 2  Final   STATUS: FINAL  Final   Result: No growth after 5 days  Final   COMMENT: Aerobic and anaerobic bottle received.  Final   Labs: CBC: Recent Labs  Lab 07/24/24 0125 07/25/24 0912 07/26/24 0419 07/26/24 1436 07/27/24 0945 07/27/24 1043 07/28/24 0358 07/29/24 1044  WBC 4.2   < > 2.6*  --  3.3* 4.2 3.1* 3.3*  NEUTROABS 3.7  --   --   --   --   --   --   --   HGB 7.7*   < > 6.8* 8.5* 8.7* 8.7* 8.5* 8.6*  HCT 25.7*   < > 22.5* 27.4* 28.1* 27.7* 27.6* 28.4*  MCV 90.8   < > 90.0  --  88.6 87.4 90.2 90.2  PLT 24*   < > 25*  --  25* 27* 25* 33*   < > = values in this interval not displayed.   Basic Metabolic Panel: Recent Labs  Lab 07/25/24 0427 07/26/24 0419 07/27/24 0945 07/28/24 0358 07/29/24 1044  NA 137 135 138 135 138  K 3.6 4.0 3.1* 3.9 3.5  CL 103 101 101 99 101  CO2 20* 21* 23 24 27   GLUCOSE 131* 134* 118* 96 145*  BUN 30* 30* 26* 29* 30*  CREATININE 1.73* 1.74* 1.60* 2.02* 1.85*  CALCIUM  8.5* 8.3* 8.6*  8.7* 8.8*  MG  --   --   --   --  1.7  PHOS  --   --   --   --  3.1   Liver Function Tests: Recent Labs  Lab 07/24/24 0125 07/26/24 0419 07/27/24 0945 07/28/24 0358 07/29/24 1044  AST 29 36 24 25  --   ALT 17 17 15 15   --   ALKPHOS 72 59 61 61  --   BILITOT 3.3* 2.9* 4.4* 3.3*  --   PROT 6.8 6.3* 6.8 6.2*  --   ALBUMIN  3.0* 2.6* 2.8* 2.6* 2.6*   CBG: Recent Labs  Lab 07/24/24 1642 07/27/24 1403  GLUCAP 185* 120*    Discharge time spent: greater than 30 minutes.  Author: Yetta Blanch, MD  [facility] 07/29/2024

## 2024-07-31 ENCOUNTER — Inpatient Hospital Stay

## 2024-07-31 DIAGNOSIS — D696 Thrombocytopenia, unspecified: Secondary | ICD-10-CM

## 2024-07-31 DIAGNOSIS — D649 Anemia, unspecified: Secondary | ICD-10-CM | POA: Diagnosis not present

## 2024-07-31 LAB — CBC WITH DIFFERENTIAL (CANCER CENTER ONLY)
Abs Immature Granulocytes: 0.02 K/uL (ref 0.00–0.07)
Basophils Absolute: 0 K/uL (ref 0.0–0.1)
Basophils Relative: 1 %
Eosinophils Absolute: 0.1 K/uL (ref 0.0–0.5)
Eosinophils Relative: 2 %
HCT: 31.1 % — ABNORMAL LOW (ref 36.0–46.0)
Hemoglobin: 9.7 g/dL — ABNORMAL LOW (ref 12.0–15.0)
Immature Granulocytes: 1 %
Lymphocytes Relative: 13 %
Lymphs Abs: 0.4 K/uL — ABNORMAL LOW (ref 0.7–4.0)
MCH: 28.1 pg (ref 26.0–34.0)
MCHC: 31.2 g/dL (ref 30.0–36.0)
MCV: 90.1 fL (ref 80.0–100.0)
Monocytes Absolute: 0.3 K/uL (ref 0.1–1.0)
Monocytes Relative: 8 %
Neutro Abs: 2.5 K/uL (ref 1.7–7.7)
Neutrophils Relative %: 75 %
Platelet Count: 37 K/uL — ABNORMAL LOW (ref 150–400)
RBC: 3.45 MIL/uL — ABNORMAL LOW (ref 3.87–5.11)
RDW: 19.1 % — ABNORMAL HIGH (ref 11.5–15.5)
WBC Count: 3.3 K/uL — ABNORMAL LOW (ref 4.0–10.5)
nRBC: 0 % (ref 0.0–0.2)

## 2024-07-31 LAB — SAMPLE TO BLOOD BANK

## 2024-08-01 DIAGNOSIS — N1832 Chronic kidney disease, stage 3b: Secondary | ICD-10-CM | POA: Diagnosis not present

## 2024-08-01 DIAGNOSIS — D696 Thrombocytopenia, unspecified: Secondary | ICD-10-CM | POA: Diagnosis not present

## 2024-08-01 DIAGNOSIS — I864 Gastric varices: Secondary | ICD-10-CM | POA: Diagnosis not present

## 2024-08-02 ENCOUNTER — Telehealth (HOSPITAL_COMMUNITY): Payer: Self-pay

## 2024-08-02 NOTE — Telephone Encounter (Signed)
 Called to confirm/remind patient of their appointment at the [facility] on 08/02/2024.   Appointment:   [x] Confirmed  [] Left mess   [] No answer/No voice mail  [] VM Full/unable to leave message  [] Phone not in service  Patient reminded to bring all medications and/or complete list.  Confirmed patient has transportation. Gave directions, instructed to utilize valet parking.

## 2024-08-05 ENCOUNTER — Inpatient Hospital Stay (HOSPITAL_BASED_OUTPATIENT_CLINIC_OR_DEPARTMENT_OTHER)
Admit: 2024-08-05 | Discharge: 2024-08-05 | Disposition: A | Discharge status: Home / Self Care | Attending: Hospitalist | Admitting: Hospitalist

## 2024-08-05 ENCOUNTER — Encounter (HOSPITAL_COMMUNITY): Payer: Self-pay

## 2024-08-05 VITALS — BP 104/62 | HR 97 | Ht 67.0 in | Wt 271.6 lb

## 2024-08-05 DIAGNOSIS — I5032 Chronic diastolic (congestive) heart failure: Secondary | ICD-10-CM | POA: Diagnosis not present

## 2024-08-05 DIAGNOSIS — N1832 Chronic kidney disease, stage 3b: Secondary | ICD-10-CM | POA: Diagnosis not present

## 2024-08-05 DIAGNOSIS — I890 Lymphedema, not elsewhere classified: Secondary | ICD-10-CM | POA: Diagnosis not present

## 2024-08-05 DIAGNOSIS — I4821 Permanent atrial fibrillation: Secondary | ICD-10-CM | POA: Diagnosis not present

## 2024-08-05 NOTE — Patient Instructions (Signed)
 Medication Changes:  No Changes In Medications at this time.   Referrals:  YOU HAVE BEEN REFERRED TO [facility] THEY WILL REACH OUT TO YOU OR CALL TO ARRANGE THIS. PLEASE CALL US  WITH ANY CONCERNS   Follow-Up in: 3 MONTHS WITH DR. ROLAN PLEASE CALL OUR OFFICE AROUND THE END OF DECEMBER TO GET SCHEDULED FOR YOUR APPOINTMENT. PHONE NUMBER IS [phone] OPTION 2   At the [facility], you and your health needs are our priority. We have a designated team specialized in the treatment of Heart Failure. This Care Team includes your primary Heart Failure Specialized Cardiologist (physician), Advanced Practice Providers (APPs- Physician Assistants and Nurse Practitioners), and Pharmacist who all work together to provide you with the care you need, when you need it.   You may see any of the following providers on your designated Care Team at your next follow up:  Dr. Toribio Fuel Dr. Ezra Rolan Dr. Odis Brownie Greig Mosses, NP Caffie Shed, [place] [facility] [place], [place] Beckey Coe, NP Jordan Lee, NP Tinnie Redman, PharmD   Please be sure to bring in all your medications bottles to every appointment.   Need to Contact Us :  If you have any questions or concerns before your next appointment please send us  a message through [facility] or call our office at [phone].    TO LEAVE A MESSAGE FOR THE NURSE SELECT OPTION 2, PLEASE LEAVE A MESSAGE INCLUDING: YOUR NAME DATE OF BIRTH CALL BACK NUMBER REASON FOR CALL**this is important as we prioritize the call backs  YOU WILL RECEIVE A CALL BACK THE SAME DAY AS LONG AS YOU CALL BEFORE 4:00 PM

## 2024-08-05 NOTE — Progress Notes (Signed)
 AD[facility]ANCED HE[facility] FAILURE CLINIC NOTE  Referring Physician: Regino Slater, MD  Primary Care: Regino Slater, MD Primary Cardiologist: Annabella Scarce, MD  HPI: Hannah Khan is a 68 y.o. female with HFpEF, morbid obesity, obstructive sleep apnea, NASH/cirrhosis, thrombocytopenia, type 2 diabetes, aortic stenosis, vasovagal syncope, and chronic atrial fibrillation.   Her cardiac history dates back to June 2021 when she was diagnosed with atrial fibrillation.  At that time she decided to continue with medical management after seeing Dr. Waddell.  She is admitted in May 2022 with atrial fibrillation and hepatic encephalopathy.  Recurrent admission and 7/22 with acute on chronic HFpEF due to dietary indiscretion.  At that time she was started on Farxiga , Bumex  2 mg daily and metolazone  as needed.  She has been unable to start spironolactone  due to symptomatic hypotension.  Due to her underlying cirrhosis she was planned to undergo TIPS on April 2024 but the procedure was aborted due to a right atrial pressure of 23 mmHg. She presented previously for optimization prior to TIPS. After diuresis, we repeated a RHC which demonstrated improvement in RA pressures. Unfortunately, TIPS complicated by hemoperitoneum. Today she presents for follow up.   Admitted 07/24/2024 with shortness of breath and abdominal discomfort. Hgb < 7. GI consulted. EGD repeated, no active bleeding. Continued on PPI. Echo LVEF 60-65%  RV mildly reduced and moderate aortic valve stenosis.  Diuresed with IV lasix  and transitioned to torsemide  80/40 daily.  GDMT limited by hypotension. Continued on midodrine .   Today she returns for HF follow up with her husband. Overall feeling much better. Denies abdominal pain. Feels ok moving around in the house. Gets SOB with house work. Denies PND/Orthopnea. No BRBPR. Appetite ok. No fever or chills. Weight at home 271-276 pounds.  Taking all medications.  Current Outpatient Medications   Medication Sig Dispense Refill   albuterol  (VENTOLIN  HFA) 108 (90 Base) MCG/ACT inhaler Inhale 1-2 puffs into the lungs every 6 (six) hours as needed for wheezing or shortness of breath.     digoxin  (LANOXIN ) 0.125 MG tablet TAKE 1 TABLET BY MOUTH EVERY DAY 90 tablet 4   lactulose  (CHRONULAC ) 10 GM/15ML solution Take 20 g by mouth 3 (three) times daily.     methocarbamol  (ROBAXIN ) 750 MG tablet Take 1 tablet (750 mg total) by mouth every 8 (eight) hours as needed for muscle spasms. 15 tablet 0   metolazone  (ZAROXOLYN ) 2.5 MG tablet Take 1 tablet (2.5 mg total) by mouth as needed. 3 tablet 1   metoprolol  tartrate (LOPRESSOR ) 25 MG tablet Take 25 mg by mouth 2 (two) times daily.     midodrine  (PROAMATINE ) 10 MG tablet Take 1.5 tablets (15 mg total) by mouth 3 (three) times daily. (Patient taking differently: Take 10 mg by mouth 3 (three) times daily.) 135 tablet 11   Multiple Vitamins-Minerals (PRESERVISION AREDS 2) CAPS Take 1 capsule by mouth daily.     pantoprazole  (PROTONIX ) 40 MG tablet Take 40 mg by mouth 2 (two) times daily.     potassium chloride  (MICRO-K ) 10 MEQ CR capsule Take 4 capsules (40 mEq total) by mouth 2 (two) times daily. Take an extra 4 tablets when taking Metolazone .     Propylene Glycol (SYSTANE BALANCE) 0.6 % SOLN Place 1 drop into both eyes 2 (two) times daily.     spironolactone  (ALDACTONE ) 25 MG tablet TAKE 3 TABLETS BY MOUTH DAILY 270 tablet 1   torsemide  (DEMADEX ) 20 MG tablet Take 4 tablets (80 mg total) by mouth in the  morning and 2 tablets (40 mg total) in the evening     TRESIBA  FLEXTOUCH 100 UNIT/ML FlexTouch Pen Inject 36 Units into the skin daily. (TITRATE WHEN DIRECTED UP TO 50 UNITS)     XIFAXAN  550 MG TABS tablet Take 550 mg by mouth 2 (two) times daily.     No current facility-administered medications for this encounter.   PHYSICAL EXAM: Vitals:   08/05/24 0854  BP: 104/62  Pulse: 97  SpO2: 93%   Wt Readings from Last 3 Encounters:  08/05/24 123.2 kg  (271 lb 9.6 oz)  07/24/24 131.1 kg (289 lb)  07/17/24 129.7 kg (285 lb 14.4 oz)    General:  Arrived in a wheelchair.  No resp difficulty Neck: no JVD.  Cor: Regular rate & rhythm. Lungs: clear Abdomen: obese, soft, nontender, nondistended.  Extremities: R and LLE trace edema Neuro: alert & oriented x3   DATA REVIEW  ECG: 04/06/23: atrial fibrillation  as per my personal interpretation  ECHO: 03/07/23: LVEF 65%, normal RV function; RV is dilated with septal flattening as per my personal interpretation  CATH: HEMODYNAMICS: RA:                  10 mmHg (mean) RV:                  34/10 mmHg PA:                  37/18 mmHg (28 mean) PCWP:            23 mmHg (mean)                                      Estimated Fick CO/CI   8.5 L/min, 3.4 L/min/m2 Thermodilution CO/CI   9 L/min, 3. 6 L/min/m2                                                TPG                 5  mmHg                                              PVR                 0.5 Wood Units  PAPi                2.4     ASSESSMENT & PLAN:  Chronic HFpEF -RHC as noted above -Unsuccessful TIPS -NYHA III. Appears euvolemic.  Continue torsemide  80 mg/40 mg with metolazone  as needed.  - Continue spironolactone  to 75mg  daily.   2. NASH Cirrhosis - Child pugh B, MELD 15 - Diagnosed in 2009 - Hx of esophageal varices - x1 admission for hepatic encephalopathy with lactulose /rifaximin .  -Admitted for TIPS on 06/30/23 which was unsuccessful; performed baloon-occluded retrograde transvenous obliteration to treat gastric varices. Post-op course was complicated by hemoperitoneum and afib w/ RVR.  - Variceal bleed as noted above - Continue spironolactone  to 75mg  daily.   3. Atrial fibrillation -Rate controlled.  -No Anticoagulation with ongoing bleeding.   4. Thrombocytopenia - Secondary to cirrhosis;  plt count ~30s   - Reviewed plt count for hospitalization; stable thrombocytopenia.   5. CKD IIIB - Baseline sCr ~ 1.7   -Continue spironolactone  to 75mg  daily.  - I reviewed BMET from [facility] PCP, stable creatinine 1.4   6. Chronic lymphedema - Continue lower extremity edema.   7. Hematemesis/variceal bleed Recent admit with recurrent anemia. No active bleeding on EGD.   Will refer to Dr Rolan as Dr Gardenia is no longer at [facility].   Follow up in 3 months with Dr Rolan.   Kenedee Molesky NP-C  1:37 PM

## 2024-08-06 DIAGNOSIS — Z860100 Personal history of colon polyps, unspecified: Secondary | ICD-10-CM | POA: Diagnosis not present

## 2024-08-06 DIAGNOSIS — K7460 Unspecified cirrhosis of liver: Secondary | ICD-10-CM | POA: Diagnosis not present

## 2024-08-06 DIAGNOSIS — I864 Gastric varices: Secondary | ICD-10-CM | POA: Diagnosis not present

## 2024-08-06 DIAGNOSIS — D649 Anemia, unspecified: Secondary | ICD-10-CM | POA: Diagnosis not present

## 2024-08-06 NOTE — Anesthesia Postprocedure Evaluation (Signed)
 Anesthesia Post Note  Patient: Hannah Khan  Procedure(s) Performed: EGD (ESOPHAGOGASTRODUODENOSCOPY) (Left)     Patient location during evaluation: PACU Anesthesia Type: General Level of consciousness: awake and alert Pain management: pain level controlled Vital Signs Assessment: post-procedure vital signs reviewed and stable Respiratory status: spontaneous breathing, nonlabored ventilation and respiratory function stable Cardiovascular status: blood pressure returned to baseline and stable Postop Assessment: no apparent nausea or vomiting Anesthetic complications: no   No notable events documented.              Shamus Desantis

## 2024-08-09 ENCOUNTER — Telehealth (HOSPITAL_COMMUNITY): Payer: Self-pay | Admitting: Emergency Medicine

## 2024-08-09 NOTE — Telephone Encounter (Signed)
 Called and spoke with Mrs Baruch scheduled initial home visit for 11/24 at 1:00.  Advised pt to reach out should she have any conflicts.

## 2024-08-11 NOTE — Progress Notes (Unsigned)
 Cardiology Office Note:    Date:  08/12/2024   ID:  NESIAH JUMP, DOB [birth year], MRN [id number]  PCP:  Regino Slater, MD   [facility] Providers Cardiologist:  Annabella Scarce, MD { Click to update primary MD,subspecialty MD or APP then REFRESH:1}    Referring MD: Regino Slater, MD   Chief complaint: Follow-up hospital visit     History of Present Illness:   CONCHETTA LAMIA is a 67 y.o. female with a hx of HFpEF, morbid obesity, OSA, NASH/cirrhosis, IDA, thrombocytopenia, type 2 diabetes, aortic stenosis, vasovagal syncope, and chronic atrial fibrillation (no OAC 2/2 thrombocytopenia), here in the office for follow-up of chronic cardiovascular conditions.  Diagnosed with atrial fibrillation 02/2020.  Saw Dr. Waddell, medical management pursued.  Admitted 01/2021 secondary to atrial fibrillation and hepatic encephalopathy.  Recurrent admission 03/2021 with acute on chronic HFpEF.  Required TIPS procedure in 2024, aborted initially 2/2 elevated right heart pressures, diuresed.  TIPS performed in October 2024, complicated by hemoperitoneum.  While on a cruise in March 2025 she began vomiting blood, she was admitted and transfused.  Admitted May 2025 with symptomatic anemia and a hemoglobin of 6.7 and a platelet count of 25K in A-fib with RVR on arrival.  Admitted July 2025 after left thigh redness and low blood pressure, was found to be septic 2/2 left thigh cellulitis, also in A-fib on arrival.  She has a history of lymphedema, with symptoms improved using leg wraps.  Admitted 06/10/2024 secondary to rectal hemorrhage, hemoglobin 5.6, 2 units PRBC transfused.  Most recent echo 04/22/2024: LVEF 60-65%, normal LV function, no RWMA, indeterminate diastolic parameters, RV SF mildly reduced, RV size mildly enlarged, mildly elevated PASP.  LA moderately dilated, RA mildly dilated, trivial MV regurg, AV with indeterminate number of cusps, moderate calcification/thickening/moderate aortic  valve stenosis.  No AV regurgitation.  Follow-up with heme-onc 07/17/2024 resulted in requiring CBC with type and cross every 2 weeks, and routine blood transfusions as needed to maintain Hgb around 8.  Most recently admitted on 07/24/2024 after presenting to the ED with abdominal discomfort, shortness of breath, and A-fib with RVR.  Hemoglobin 7, platelets less than 30K, received 1u PRBC.  She followed up with advanced heart failure clinic on 08/05/2024, meds were continued and plan on follow-up with [facility] in 3 months, referred to the para-medicine program.  Presents independently, appears stable from a cardiovascular standpoint.  Reports she is feeling much better following her recent admission. She denies chest pain, palpitations, dyspnea, orthopnea, n, v, dark/tarry/bloody stools, hematuria, dizziness, syncope, edema, weight gain.  Has not needed her metolazone  in 1 month.  Has been making dietary changes to lose weight.  ROS:   Please see the history of present illness.    All other systems reviewed and are negative.     Past Medical History:  Diagnosis Date   Anemia    hx of   Aortic stenosis 02/2023   Echo 03/07/23 LVEF 55-60%, mild to moderate AS   Arthritis    knees   CHF (congestive heart failure) (HCC)    chronic diastolic   Diabetes mellitus without complication (HCC)    type 2   Dyspnea    Dysrhythmia    Fibromyalgia    Gastric varices    large   H/O transfusion of platelets    Heart murmur    never has caused any problems   Hx of colonic polyps    s/p partial colectomy   Hyperlipidemia  Hypertension    Joint pain    in knees and back spasms   Left leg swelling    wear compression hose   Liver cirrhosis secondary to NASH (nonalcoholic steatohepatitis) (HCC) dx nov 2014   had enlarged spleen also    Morbid obesity (HCC)    Persistent atrial fibrillation (HCC)    Pneumonia 10/2016   x 5   PONV (postoperative nausea and vomiting)    in past none recent    Portal hypertension (HCC)    Sleep apnea    uses cpap, setting varies between 14-20   Spleen enlarged    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     Past Surgical History:  Procedure Laterality Date   BREAST LUMPECTOMY WITH RADIOACTIVE SEED LOCALIZATION Right 08/29/2017   Procedure: RIGHT BREAST LUMPECTOMY WITH RADIOACTIVE SEEDS LOCALIZATION;  Surgeon: Vanderbilt Ned, MD;  Location: [facility] OR;  Service: General;  Laterality: Right;   CESAREAN SECTION  1989   CHOLECYSTECTOMY  20 yrs ago   COLECTOMY  2011   COLONOSCOPY     COLONOSCOPY N/A 09/05/2018   Procedure: COLONOSCOPY;  Surgeon: Burnette Fallow, MD;  Location: [facility];  Service: Endoscopy;  Laterality: N/A;   COLONOSCOPY WITH PROPOFOL  Bilateral 05/04/2022   Procedure: COLONOSCOPY WITH PROPOFOL ;  Surgeon: Burnette Fallow, MD;  Location: [facility];  Service: Gastroenterology;  Laterality: Bilateral;   DILATATION & CURETTAGE/HYSTEROSCOPY WITH MYOSURE N/A 05/06/2016   Procedure: DILATATION & CURETTAGE/HYSTEROSCOPY WITH MYOSURE WITH POLYPECTOMY;  Surgeon: Delon Prude, DO;  Location: [facility];  Service: Gynecology;  Laterality: N/A;   ESOPHAGOGASTRODUODENOSCOPY Left 07/27/2024   Procedure: EGD (ESOPHAGOGASTRODUODENOSCOPY);  Surgeon: Kriss Estefana DEL, DO;  Location[facility];  Service: Gastroenterology;  Laterality: Left;   ESOPHAGOGASTRODUODENOSCOPY (EGD) WITH PROPOFOL  N/A 09/04/2013   Procedure: ESOPHAGOGASTRODUODENOSCOPY (EGD) WITH PROPOFOL ;  Surgeon: Fallow Burnette, MD;  Location: [facility];  Service: Endoscopy;  Laterality: N/A;   ESOPHAGOGASTRODUODENOSCOPY (EGD) WITH PROPOFOL  Bilateral 05/04/2022   Procedure: ESOPHAGOGASTRODUODENOSCOPY (EGD) WITH PROPOFOL ;  Surgeon: Burnette Fallow, MD;  Location: [facility];  Service: Gastroenterology;  Laterality: Bilateral;   IR EMBO ART  VEN HEMORR LYMPH EXTRAV  INC GUIDE ROADMAPPING  06/30/2023   IR INTRAVASCULAR ULTRASOUND NON CORONARY  12/23/2022   IR RADIOLOGIST EVAL & MGMT  09/21/2022    IR RADIOLOGIST EVAL & MGMT  10/06/2022   IR RADIOLOGIST EVAL & MGMT  11/02/2022   IR RADIOLOGIST EVAL & MGMT  01/31/2023   IR RADIOLOGIST EVAL & MGMT  05/31/2023   IR RADIOLOGIST EVAL & MGMT  08/21/2023   IR RADIOLOGIST EVAL & MGMT  12/25/2023   IR TIPS  12/23/2022   IR TIPS  06/30/2023   IR US  GUIDE VASC ACCESS LEFT  06/30/2023   IR US  GUIDE VASC ACCESS RIGHT  12/23/2022   IR US  GUIDE VASC ACCESS RIGHT  06/30/2023   IR US  GUIDE VASC ACCESS RIGHT  06/30/2023   KNEE ARTHROSCOPY  yrs ago   bilateral, one done x 1, one done twice   POLYPECTOMY  09/05/2018   Procedure: POLYPECTOMY;  Surgeon: Burnette Fallow, MD;  Location: [facility];  Service: Endoscopy;;   RADIOLOGY WITH ANESTHESIA N/A 12/23/2022   Procedure: TIPS;  Surgeon: Jennefer Ester PARAS, MD;  Location: [facility] OR;  Service: Radiology;  Laterality: N/A;   RIGHT HEART CATH N/A 05/19/2023   Procedure: RIGHT HEART CATH;  Surgeon: Gardenia Led, DO;  Location: [facility];  Service: Cardiovascular;  Laterality: N/A;   TIPS PROCEDURE N/A 06/30/2023   Procedure: TRANS-JUGULAR INTRAHEPATIC PORTAL  SHUNT (TIPS);  Surgeon: Jennefer Ester PARAS, MD;  Location: [facility] OR;  Service: Radiology;  Laterality: N/A;    Current Medications: Current Meds  Medication Sig   albuterol  (VENTOLIN  HFA) 108 (90 Base) MCG/ACT inhaler Inhale 1-2 puffs into the lungs every 6 (six) hours as needed for wheezing or shortness of breath.   digoxin  (LANOXIN ) 0.125 MG tablet TAKE 1 TABLET BY MOUTH EVERY DAY   lactulose  (CHRONULAC ) 10 GM/15ML solution Take 20 g by mouth 3 (three) times daily.   methocarbamol  (ROBAXIN ) 750 MG tablet Take 1 tablet (750 mg total) by mouth every 8 (eight) hours as needed for muscle spasms.   metolazone  (ZAROXOLYN ) 2.5 MG tablet Take 1 tablet (2.5 mg total) by mouth as needed.   metoprolol  tartrate (LOPRESSOR ) 25 MG tablet Take 25 mg by mouth 2 (two) times daily.   midodrine  (PROAMATINE ) 10 MG tablet Take 1.5 tablets (15 mg total) by mouth 3 (three)  times daily. (Patient taking differently: Take 10 mg by mouth 3 (three) times daily.)   Multiple Vitamins-Minerals (PRESERVISION AREDS 2) CAPS Take 1 capsule by mouth daily.   pantoprazole  (PROTONIX ) 40 MG tablet Take 40 mg by mouth 2 (two) times daily.   potassium chloride  (MICRO-K ) 10 MEQ CR capsule Take 4 capsules (40 mEq total) by mouth 2 (two) times daily. Take an extra 4 tablets when taking Metolazone .   Propylene Glycol (SYSTANE BALANCE) 0.6 % SOLN Place 1 drop into both eyes 2 (two) times daily.   spironolactone  (ALDACTONE ) 25 MG tablet TAKE 3 TABLETS BY MOUTH DAILY   torsemide  (DEMADEX ) 20 MG tablet Take 4 tablets (80 mg total) by mouth in the morning and 2 tablets (40 mg total) in the evening   TRESIBA  FLEXTOUCH 100 UNIT/ML FlexTouch Pen Inject 36 Units into the skin daily. (TITRATE WHEN DIRECTED UP TO 50 UNITS)   XIFAXAN  550 MG TABS tablet Take 550 mg by mouth 2 (two) times daily.     Allergies:   Celebrex [celecoxib], Shellfish allergy, Tomato, Barium-containing compounds, Fentanyl , Nsaids, Prednisone, Sglt2 inhibitors, Statins, Tylenol  [acetaminophen ], Ibuprofen , Tramadol , and Zetia [ezetimibe]   Social History   Socioeconomic History   Marital status: Married    Spouse name: Not on file   Number of children: Not on file   Years of education: Not on file   Highest education level: Not on file  Occupational History   Occupation: Scientist, Product/process Development  Tobacco Use   Smoking status: Former    Current packs/day: 0.00    Average packs/day: 1 pack/day for 3.0 years (3.0 ttl pk-yrs)    Types: Cigarettes    Start date: 09/20/1971    Quit date: 09/19/1974    Years since quitting: 49.9    Passive exposure: Past   Smokeless tobacco: Never  Vaping Use   Vaping status: Never Used  Substance and Sexual Activity   Alcohol  use: Not Currently    Comment: occ   Drug use: No   Sexual activity: Not on file  Other Topics Concern   Not on file  Social History Narrative   Lives in [place]  with her husband.  Instructor for early childhood development.     Social Drivers of Corporate Investment Banker Strain: Not on file  Food Insecurity: No Food Insecurity (08/12/2024)   Hunger Vital Sign    Worried About Running Out of Food in the Last Year: Never true    Ran Out of Food in the Last Year: Never true  Transportation Needs: No  Transportation Needs (07/24/2024)   PRAPARE - Administrator, Civil Service (Medical): No    Lack of Transportation (Non-Medical): No  Physical Activity: Not on file  Stress: Not on file  Social Connections: Socially Integrated (07/24/2024)   Social Connection and Isolation Panel    Frequency of Communication with Friends and Family: More than three times a week    Frequency of Social Gatherings with Friends and Family: More than three times a week    Attends Religious Services: More than 4 times per year    Active Member of [employer] or Organizations: No    Attends Engineer, Structural: More than 4 times per year    Marital Status: Married     Family History: [name] family history includes Cancer in her brother, father, and mother; Congestive Heart Failure in her mother; Heart failure in her mother; Hyperlipidemia in her brother and mother; Lung cancer in her father; Stroke in her mother and another family member. There is no history of Breast cancer.  EKGs/Labs/Other Studies Reviewed:    The following studies were reviewed today:  EKG Interpretation Date/Time:  Monday August 12 2024 11:06:14 EST Ventricular Rate:  85 PR Interval:    QRS Duration:  90 QT Interval:  320 QTC Calculation: 380 R Axis:   -53  Text Interpretation: Atrial fibrillation Left anterior fascicular block Cannot rule out Anteroseptal infarct , age undetermined No significant change from prior study Confirmed by Elaine Moloney [id number]) on 08/12/2024 7:28:59 PM    Recent Labs: 02/02/2024: Pro Brain Natriuretic Peptide 1,655.0 04/20/2024: TSH  0.817 07/24/2024: B Natriuretic Peptide 292.4 07/28/2024: ALT 15 07/29/2024: BUN 30; Creatinine, Ser 1.85; Magnesium  1.7; Potassium 3.5; Sodium 138 07/31/2024: Hemoglobin 9.7; Platelet Count 37  Recent Lipid Panel    Component Value Date/Time   CHOL 167 07/14/2022 0943   TRIG 77 07/14/2022 0943   HDL 84 07/14/2022 0943   CHOLHDL 2.0 07/14/2022 0943   CHOLHDL 3.0 07/02/2014 1138   VLDL 19 07/02/2014 1138   LDLCALC 69 07/14/2022 0943     Risk Assessment/Calculations:    CHA2DS2-VASc Score = 5  {Confirm score is correct.  If not, click here to update score.  REFRESH note.  :1} This indicates a 7.2% annual risk of stroke. [name] score is based upon: CHF History: 1 HTN History: 1 Diabetes History: 0 Stroke History: 0 Vascular Disease History: 1 Age Score: 1 Gender Score: 1   {This patient has a significant risk of stroke if diagnosed with atrial fibrillation.  Please consider VKA or DOAC agent for anticoagulation if the bleeding risk is acceptable.   You can also use the SmartPhrase .[email or web address] for documentation.   :[id number]}            Physical Exam:    VS:  BP 114/60 (BP Location: Left Arm, Patient Position: Sitting, Cuff Size: Large)   Pulse 85   Ht 5' 7 (1.702 m)   Wt 261 lb 12.8 oz (118.8 kg)   SpO2 97%   BMI 41.00 kg/m        Wt Readings from Last 3 Encounters:  08/12/24 261 lb 3.2 oz (118.5 kg)  08/12/24 261 lb 12.8 oz (118.8 kg)  08/05/24 271 lb 9.6 oz (123.2 kg)     GEN:  Well nourished, well developed in no acute distress HEENT: Normal NECK:  No carotid bruits CARDIAC:  S1-S2 normal, irregularly irregular, 4/6 systolic murmur, no rubs, gallops RESPIRATORY:  Clear to auscultation without rales, wheezing  or rhonchi  MUSCULOSKELETAL: Trace bilateral lower extremity edema; No deformity  SKIN: Warm and dry NEUROLOGIC:  Alert and oriented x 3 PSYCHIATRIC:  Normal affect       Assessment & Plan Permanent atrial fibrillation (HCC) EKG:  Atrial fibrillation, LAFB, cannot rule out anteroseptal infarct, age undetermined, no significant change from prior study, 85 bpm Multiple discussions of ablation with PPM have been documented over the years, deferred as she is a poor candidate for surgery. Historical pattern of RVR when hemoglobin drops below 8, likely compensatory, continue plan with heme-onc to maintain hemoglobin levels. Digoxin  level 0.9 on 07/24/2024 Continue digoxin  0.125 mg daily Continue metoprolol  to tartrate 25 mg twice daily Chronic heart failure with preserved ejection fraction (HFpEF) (HCC) Echo 04/22/2024: LVEF 60-65%, no RWMA, indeterminate diastolic parameters.  Mildly reduced RV function with mildly enlarged RV size, mildly elevated PASP RHC 05/19/2023: Mildly elevated pre and postcapillary filling pressures, normal CO/CI, normal PVR PAPI Followed by advanced heart failure clinic, last seen 08/05/2024 Appears euvolemic today Denies CP, SOB, palpitations, edema Continue torsemide  80 mg in a.m. and 40 mg in p.m. daily Continue spironolactone  75 mg daily Continue potassium chloride  40 mg twice daily, with an extra 4 tablets when taking metolazone  Continue Lopressor  25 mg twice daily Continue metolazone  2.5 mg as needed for swelling Thrombocytopenia Continue plan with heme-onc for regular CBCs and transfusions as needed Continue to monitor for abnormal/spontaneous bleeding Liver cirrhosis secondary to NASH (nonalcoholic steatohepatitis) (HCC) Diagnosed in 2009 Child pugh B, MELD 15  EGD 07/27/24: esophageal/gastric varix (graded as GOV2) may be better suited for endoscopic glue injection versus EUS guided coil embolization versus repeat TIPS attempt  Dr. Jennefer note 12/2023: plan was for patient to follow up with IR in June 2025 after meeting with [facility], previously reported she has not yet followed up with IR (per notes by Dr. Kriss on 07/28/2024) and has reported follow-up scheduled for January  2026, currently no further endoscopic intervention planned at this time.  Follow-up in 6 months or sooner if needed If new or concerning symptoms develop please proceed to the emergency department          Medication Adjustments/Labs and Tests Ordered: Current medicines are reviewed at length with the patient today.  Concerns regarding medicines are outlined above.  Orders Placed This Encounter  Procedures   EKG 12-Lead   No orders of the defined types were placed in this encounter.   Patient Instructions  Medication Instructions:  Continue your current medications.   *If you need a refill on your cardiac medications before your next appointment, please call your pharmacy*  Follow-Up: At [facility], you and your health needs are our priority.  As part of our continuing mission to provide you with exceptional heart care, our providers are all part of one team.  This team includes your primary Cardiologist (physician) and Advanced Practice Providers or APPs (Physician Assistants and Nurse Practitioners) who all work together to provide you with the care you need, when you need it.  Your next appointment:   6 month(s)  Provider:   Annabella Scarce, MD, Rosaline Bane, NP, or Reche Finder, NP    We recommend signing up for the patient portal called MyChart.  Sign up information is provided on this After Visit Summary.  MyChart is used to connect with patients for Virtual Visits (Telemedicine).  Patients are able to view lab/test results, encounter notes, upcoming appointments, etc.  Non-urgent messages can be sent to your  provider as well.   To learn more about what you can do with MyChart, go to [email or web address].            Signed, Miriam FORBES Shams, NP  08/12/2024 7:34 PM    [facility]

## 2024-08-12 ENCOUNTER — Other Ambulatory Visit (HOSPITAL_COMMUNITY): Payer: Self-pay | Admitting: Emergency Medicine

## 2024-08-12 ENCOUNTER — Ambulatory Visit (HOSPITAL_BASED_OUTPATIENT_CLINIC_OR_DEPARTMENT_OTHER): Admitting: Emergency Medicine

## 2024-08-12 ENCOUNTER — Encounter (HOSPITAL_BASED_OUTPATIENT_CLINIC_OR_DEPARTMENT_OTHER): Payer: Self-pay | Admitting: Emergency Medicine

## 2024-08-12 VITALS — BP 114/60 | HR 85 | Ht 67.0 in | Wt 261.8 lb

## 2024-08-12 DIAGNOSIS — I4821 Permanent atrial fibrillation: Secondary | ICD-10-CM | POA: Diagnosis not present

## 2024-08-12 DIAGNOSIS — I5032 Chronic diastolic (congestive) heart failure: Secondary | ICD-10-CM | POA: Diagnosis not present

## 2024-08-12 DIAGNOSIS — K7469 Other cirrhosis of liver: Secondary | ICD-10-CM | POA: Diagnosis not present

## 2024-08-12 DIAGNOSIS — K7581 Nonalcoholic steatohepatitis (NASH): Secondary | ICD-10-CM | POA: Diagnosis not present

## 2024-08-12 DIAGNOSIS — D696 Thrombocytopenia, unspecified: Secondary | ICD-10-CM

## 2024-08-12 NOTE — Progress Notes (Unsigned)
 SOCIAL/MEDICAL BARRIERS:  PHARMACY USED  [facility] [address] in [place]   MED ISSUES:  AFFORDABILITY NONE  PT ASSIST APPS NEEDED NONE  PCP Dr. Regino   INSURANCE [name]  SOURCE OF INCOME Retirement & Social Security  TRANSPORTATION YES  FOOD INSECURITIES/NEEDS None FOOD STAMPS NONE  REVIEWED DIET/FLUID/SALT RESTRICTIONS YES  RENT/OWN HOME ISSUES Owns home  SOCIAL SUPPORT  NONE  SAFETY/DOMESTIC ISSUES YES  SUBSTANCE ABUSE NONE  DAILY WEIGHTS YES  EDUCATE ON DISEASE PROCESS/SYMPTOMS/PURPOSES OF MEDS YES   Paramedicine Encounter    Patient ID: Hannah Khan, female    DOB: 11/04/1955, 68 y.o.   MRN: [id number]   Complaints  NONE  Assessment  A&O x 4, skin W&D w/ good color.  Denies chest pain or SOB.  Compliance with meds YES  Pill box filled NO  Refills needed  NONE  Meds changes since last visit NONE    Social changes NONE   BP (!) 140/68 (BP Location: Left Arm, Patient Position: Sitting, Cuff Size: Normal)   Pulse (!) 109 Comment: chronic A-Fib  Resp 18   Wt 261 lb 3.2 oz (118.5 kg)   SpO2 96%   BMI 40.91 kg/m  Weight yesterday- Last visit weight-  ACTION: Home visit completed  Mary Claudene Kennel [phone] 08/12/24  Patient Care Team: Regino Slater, MD as PCP - General (Family Medicine) Raford Riggs, MD as PCP - Cardiology (Cardiology) Amadeo Windell SAILOR, MD (Inactive) as Consulting Physician (Hematology and Oncology)  Patient Active Problem List   Diagnosis Date Noted   Chronic diastolic heart failure (HCC) 07/25/2024   Generalized abdominal pain 07/25/2024   Esophageal and gastric varices (HCC) 06/14/2024   Symptomatic anemia 06/10/2024   Cellulitis of left lower extremity 04/22/2024   Staphylococcus epidermidis bacteremia 04/21/2024   CHF (congestive heart failure) (HCC) 02/02/2024   Hypoxia 07/04/2023   Nausea & vomiting 07/04/2023   Nausea and vomiting 07/03/2023   Intractable nausea and  vomiting 04/07/2023   Intractable vomiting with nausea 04/06/2023   Acute on chronic diastolic heart failure (HCC) 03/06/2023   Chronic hypotension 03/06/2023   Sepsis due to cellulitis (HCC) 03/05/2023   Severe sepsis (HCC) 03/05/2023   Portal hypertension (HCC) 12/23/2022   Acute encephalopathy 01/10/2021   Secondary hypercoagulable state 06/23/2020   Streptococcal bacteremia 03/16/2020   Chronic venous stasis dermatitis of both lower extremities 03/16/2020   Atrial fibrillation with RVR (HCC) 04/26/2018   Acute lower UTI 04/26/2018   Liver cirrhosis secondary to NASH (HCC) 04/26/2018   Hyperlipidemia 04/26/2018   Type 2 diabetes mellitus (HCC)    Cellulitis of right lower extremity 03/15/2018   Sepsis (HCC) 03/14/2018   Community acquired pneumonia of right lower lobe of lung 10/31/2016   Muscular abdominal pain in right upper quadrant 07/11/2016   Chronic heart failure with preserved ejection fraction (HFpEF) (HCC) 07/10/2016   Thrombocytopenia 07/10/2016   Permanent atrial fibrillation (HCC) 01/30/2016   Morbid obesity- 12/10/2015   Cellulitis, abdominal wall    Melena 01/31/2015   Aortic heart murmur 01/31/2015   Anemia 07/23/2013   Obstructive sleep apnea 08/07/2007   Essential hypertension 08/07/2007   Allergic rhinitis 08/07/2007    Current Outpatient Medications:    albuterol  (VENTOLIN  HFA) 108 (90 Base) MCG/ACT inhaler, Inhale 1-2 puffs into the lungs every 6 (six) hours as needed for wheezing or shortness of breath., Disp: , Rfl:    digoxin  (LANOXIN ) 0.125 MG tablet, TAKE 1 TABLET BY MOUTH EVERY DAY, Disp: 90 tablet, Rfl: 4  lactulose  (CHRONULAC ) 10 GM/15ML solution, Take 20 g by mouth 3 (three) times daily., Disp: , Rfl:    methocarbamol  (ROBAXIN ) 750 MG tablet, Take 1 tablet (750 mg total) by mouth every 8 (eight) hours as needed for muscle spasms., Disp: 15 tablet, Rfl: 0   metolazone  (ZAROXOLYN ) 2.5 MG tablet, Take 1 tablet (2.5 mg total) by mouth as needed.,  Disp: 3 tablet, Rfl: 1   metoprolol  tartrate (LOPRESSOR ) 25 MG tablet, Take 25 mg by mouth 2 (two) times daily., Disp: , Rfl:    midodrine  (PROAMATINE ) 10 MG tablet, Take 1.5 tablets (15 mg total) by mouth 3 (three) times daily. (Patient taking differently: Take 10 mg by mouth 3 (three) times daily.), Disp: 135 tablet, Rfl: 11   Multiple Vitamins-Minerals (PRESERVISION AREDS 2) CAPS, Take 1 capsule by mouth daily., Disp: , Rfl:    pantoprazole  (PROTONIX ) 40 MG tablet, Take 40 mg by mouth 2 (two) times daily., Disp: , Rfl:    potassium chloride  (MICRO-K ) 10 MEQ CR capsule, Take 4 capsules (40 mEq total) by mouth 2 (two) times daily. Take an extra 4 tablets when taking Metolazone ., Disp: , Rfl:    Propylene Glycol (SYSTANE BALANCE) 0.6 % SOLN, Place 1 drop into both eyes 2 (two) times daily., Disp: , Rfl:    spironolactone  (ALDACTONE ) 25 MG tablet, TAKE 3 TABLETS BY MOUTH DAILY, Disp: 270 tablet, Rfl: 1   torsemide  (DEMADEX ) 20 MG tablet, Take 4 tablets (80 mg total) by mouth in the morning and 2 tablets (40 mg total) in the evening, Disp: , Rfl:    TRESIBA  FLEXTOUCH 100 UNIT/ML FlexTouch Pen, Inject 36 Units into the skin daily. (TITRATE WHEN DIRECTED UP TO 50 UNITS), Disp: , Rfl:    XIFAXAN  550 MG TABS tablet, Take 550 mg by mouth 2 (two) times daily., Disp: , Rfl:  Allergies  Allergen Reactions   Celebrex [Celecoxib] Other (See Comments)    speech slurred with celebrex   Shellfish Allergy Swelling, Rash and Other (See Comments)    TINGLING AND SWELLING OF LIPS. LARGE WHELPS, QUARTER-SIZE   Tomato Anaphylaxis    Pt cannot swallow due to blisters that form inside throat resulting in swelling.    Barium-Containing Compounds Other (See Comments)    TACHYCARDIA   Fentanyl  Other (See Comments)    Hallucinations    Nsaids Other (See Comments)    Contraindication due to CKD stage 3    Prednisone Other (See Comments)    TACHYCARDIA   Sglt2 Inhibitors Other (See Comments)    Ozempic , Severe  gastroparesis    Statins Other (See Comments)    AGGRAVATED FIBROMYALGIA   Tylenol  [Acetaminophen ] Other (See Comments)    UNSPECIFIED SPECIFIC REACTION >> DUE TO PLATELETS   Ibuprofen  Other (See Comments)    UNSPECIFIED SPECIFIC REACTION >> DUE TO PLATELETS   Tramadol  Other (See Comments)    Hallucination    Zetia [Ezetimibe] Other (See Comments)    Headache     Social History   Socioeconomic History   Marital status: Married    Spouse name: Not on file   Number of children: Not on file   Years of education: Not on file   Highest education level: Not on file  Occupational History   Occupation: Scientist, Product/process Development  Tobacco Use   Smoking status: Former    Current packs/day: 0.00    Average packs/day: 1 pack/day for 3.0 years (3.0 ttl pk-yrs)    Types: Cigarettes    Start date: 09/20/1971  Quit date: 09/19/1974    Years since quitting: 49.9    Passive exposure: Past   Smokeless tobacco: Never  Vaping Use   Vaping status: Never Used  Substance and Sexual Activity   Alcohol  use: Not Currently    Comment: occ   Drug use: No   Sexual activity: Not on file  Other Topics Concern   Not on file  Social History Narrative   Lives in [place] with her husband.  Instructor for early childhood development.     Social Drivers of Corporate Investment Banker Strain: Not on file  Food Insecurity: No Food Insecurity (08/12/2024)   Hunger Vital Sign    Worried About Running Out of Food in the Last Year: Never true    Ran Out of Food in the Last Year: Never true  Transportation Needs: No Transportation Needs (07/24/2024)   PRAPARE - Administrator, Civil Service (Medical): No    Lack of Transportation (Non-Medical): No  Physical Activity: Not on file  Stress: Not on file  Social Connections: Socially Integrated (07/24/2024)   Social Connection and Isolation Panel    Frequency of Communication with Friends and Family: More than three times a week    Frequency of  Social Gatherings with Friends and Family: More than three times a week    Attends Religious Services: More than 4 times per year    Active Member of Clubs or Organizations: No    Attends Engineer, Structural: More than 4 times per year    Marital Status: Married  Catering Manager Violence: Not At Risk (07/24/2024)   Humiliation, Afraid, Rape, and Kick questionnaire    Fear of Current or Ex-Partner: No    Emotionally Abused: No    Physically Abused: No    Sexually Abused: No    Physical Exam      Future Appointments  Date Time Provider Department Center  08/14/2024 10:30 AM [facility] None  08/28/2024 10:30 AM [facility] None  09/05/2024  2:30 PM [facility] [address]  09/11/2024 10:30 AM [facility] None  09/11/2024 11:00 AM Odean Potts, MD [facility] None

## 2024-08-12 NOTE — Assessment & Plan Note (Signed)
 Continue plan with heme-onc for regular CBCs and transfusions as needed Continue to monitor for abnormal/spontaneous bleeding

## 2024-08-12 NOTE — Assessment & Plan Note (Addendum)
 Echo 04/22/2024: LVEF 60-65%, no RWMA, indeterminate diastolic parameters.  Mildly reduced RV function with mildly enlarged RV size, mildly elevated PASP RHC 05/19/2023: Mildly elevated pre and postcapillary filling pressures, normal CO/CI, normal PVR PAPI Followed by advanced heart failure clinic, last seen 08/05/2024 Appears euvolemic today Denies CP, SOB, palpitations, edema Continue torsemide  80 mg in a.m. and 40 mg in p.m. daily Continue spironolactone  75 mg daily Continue potassium chloride  40 mg twice daily, with an extra 4 tablets when taking metolazone  Continue Lopressor  25 mg twice daily Continue metolazone  2.5 mg as needed for swelling

## 2024-08-12 NOTE — Assessment & Plan Note (Addendum)
 EKG: Atrial fibrillation, LAFB, cannot rule out anteroseptal infarct, age undetermined, no significant change from prior study, 85 bpm Multiple discussions of ablation with PPM have been documented over the years, deferred as she is a poor candidate for surgery. Historical pattern of RVR when hemoglobin drops below 8, likely compensatory, continue plan with heme-onc to maintain hemoglobin levels. Digoxin  level 0.9 on 07/24/2024 Continue digoxin  0.125 mg daily Continue metoprolol  to tartrate 25 mg twice daily

## 2024-08-12 NOTE — Patient Instructions (Signed)
 Medication Instructions:  Continue your current medications  *If you need a refill on your cardiac medications before your next appointment, please call your pharmacy*  Follow-Up: At [facility], you and your health needs are our priority.  As part of our continuing mission to provide you with exceptional heart care, our providers are all part of one team.  This team includes your primary Cardiologist (physician) and Advanced Practice Providers or APPs (Physician Assistants and Nurse Practitioners) who all work together to provide you with the care you need, when you need it.  Your next appointment:   6 month(s)  Provider:   Chilton Si, MD, Eligha Bridegroom, NP, or Gillian Shields, NP    We recommend signing up for the patient portal called "MyChart".  Sign up information is provided on this After Visit Summary.  MyChart is used to connect with patients for Virtual Visits (Telemedicine).  Patients are able to view lab/test results, encounter notes, upcoming appointments, etc.  Non-urgent messages can be sent to your provider as well.   To learn more about what you can do with MyChart, go to [email or web address].

## 2024-08-13 ENCOUNTER — Other Ambulatory Visit: Payer: Self-pay | Admitting: *Deleted

## 2024-08-13 DIAGNOSIS — D696 Thrombocytopenia, unspecified: Secondary | ICD-10-CM

## 2024-08-14 ENCOUNTER — Inpatient Hospital Stay

## 2024-08-14 DIAGNOSIS — D696 Thrombocytopenia, unspecified: Secondary | ICD-10-CM

## 2024-08-14 DIAGNOSIS — D649 Anemia, unspecified: Secondary | ICD-10-CM | POA: Diagnosis not present

## 2024-08-14 LAB — CBC WITH DIFFERENTIAL (CANCER CENTER ONLY)
Abs Immature Granulocytes: 0.01 K/uL (ref 0.00–0.07)
Basophils Absolute: 0 K/uL (ref 0.0–0.1)
Basophils Relative: 1 %
Eosinophils Absolute: 0.1 K/uL (ref 0.0–0.5)
Eosinophils Relative: 3 %
HCT: 33.8 % — ABNORMAL LOW (ref 36.0–46.0)
Hemoglobin: 10.9 g/dL — ABNORMAL LOW (ref 12.0–15.0)
Immature Granulocytes: 0 %
Lymphocytes Relative: 16 %
Lymphs Abs: 0.6 K/uL — ABNORMAL LOW (ref 0.7–4.0)
MCH: 28.6 pg (ref 26.0–34.0)
MCHC: 32.2 g/dL (ref 30.0–36.0)
MCV: 88.7 fL (ref 80.0–100.0)
Monocytes Absolute: 0.3 K/uL (ref 0.1–1.0)
Monocytes Relative: 9 %
Neutro Abs: 2.6 K/uL (ref 1.7–7.7)
Neutrophils Relative %: 71 %
Platelet Count: 34 K/uL — ABNORMAL LOW (ref 150–400)
RBC: 3.81 MIL/uL — ABNORMAL LOW (ref 3.87–5.11)
RDW: 19.9 % — ABNORMAL HIGH (ref 11.5–15.5)
WBC Count: 3.7 K/uL — ABNORMAL LOW (ref 4.0–10.5)
nRBC: 0 % (ref 0.0–0.2)

## 2024-08-14 LAB — SAMPLE TO BLOOD BANK

## 2024-08-21 DIAGNOSIS — R188 Other ascites: Secondary | ICD-10-CM | POA: Diagnosis not present

## 2024-08-21 DIAGNOSIS — N184 Chronic kidney disease, stage 4 (severe): Secondary | ICD-10-CM | POA: Diagnosis not present

## 2024-08-21 DIAGNOSIS — I4891 Unspecified atrial fibrillation: Secondary | ICD-10-CM | POA: Diagnosis not present

## 2024-08-21 DIAGNOSIS — K7581 Nonalcoholic steatohepatitis (NASH): Secondary | ICD-10-CM | POA: Diagnosis not present

## 2024-08-21 DIAGNOSIS — K7682 Hepatic encephalopathy: Secondary | ICD-10-CM | POA: Diagnosis not present

## 2024-08-21 DIAGNOSIS — I864 Gastric varices: Secondary | ICD-10-CM | POA: Diagnosis not present

## 2024-08-21 DIAGNOSIS — K7460 Unspecified cirrhosis of liver: Secondary | ICD-10-CM | POA: Diagnosis not present

## 2024-08-22 DIAGNOSIS — R188 Other ascites: Secondary | ICD-10-CM | POA: Diagnosis not present

## 2024-08-22 DIAGNOSIS — K7460 Unspecified cirrhosis of liver: Secondary | ICD-10-CM | POA: Diagnosis not present

## 2024-08-22 DIAGNOSIS — I864 Gastric varices: Secondary | ICD-10-CM | POA: Diagnosis not present

## 2024-08-22 DIAGNOSIS — I8510 Secondary esophageal varices without bleeding: Secondary | ICD-10-CM | POA: Diagnosis not present

## 2024-08-22 DIAGNOSIS — K5989 Other specified functional intestinal disorders: Secondary | ICD-10-CM | POA: Diagnosis not present

## 2024-08-22 DIAGNOSIS — K766 Portal hypertension: Secondary | ICD-10-CM | POA: Diagnosis not present

## 2024-08-23 DIAGNOSIS — K7469 Other cirrhosis of liver: Secondary | ICD-10-CM | POA: Diagnosis not present

## 2024-08-27 ENCOUNTER — Other Ambulatory Visit: Payer: Self-pay | Admitting: *Deleted

## 2024-08-27 DIAGNOSIS — D696 Thrombocytopenia, unspecified: Secondary | ICD-10-CM

## 2024-08-27 DIAGNOSIS — D649 Anemia, unspecified: Secondary | ICD-10-CM

## 2024-08-28 ENCOUNTER — Inpatient Hospital Stay: Attending: Hematology and Oncology

## 2024-08-28 DIAGNOSIS — D649 Anemia, unspecified: Secondary | ICD-10-CM | POA: Insufficient documentation

## 2024-08-28 DIAGNOSIS — D696 Thrombocytopenia, unspecified: Secondary | ICD-10-CM

## 2024-08-28 DIAGNOSIS — D72819 Decreased white blood cell count, unspecified: Secondary | ICD-10-CM | POA: Insufficient documentation

## 2024-08-28 LAB — CBC WITH DIFFERENTIAL (CANCER CENTER ONLY)
Abs Immature Granulocytes: 0.01 K/uL (ref 0.00–0.07)
Basophils Absolute: 0 K/uL (ref 0.0–0.1)
Basophils Relative: 1 %
Eosinophils Absolute: 0.1 K/uL (ref 0.0–0.5)
Eosinophils Relative: 3 %
HCT: 30.9 % — ABNORMAL LOW (ref 36.0–46.0)
Hemoglobin: 10.4 g/dL — ABNORMAL LOW (ref 12.0–15.0)
Immature Granulocytes: 0 %
Lymphocytes Relative: 13 %
Lymphs Abs: 0.5 K/uL — ABNORMAL LOW (ref 0.7–4.0)
MCH: 29.9 pg (ref 26.0–34.0)
MCHC: 33.7 g/dL (ref 30.0–36.0)
MCV: 88.8 fL (ref 80.0–100.0)
Monocytes Absolute: 0.3 K/uL (ref 0.1–1.0)
Monocytes Relative: 8 %
Neutro Abs: 2.9 K/uL (ref 1.7–7.7)
Neutrophils Relative %: 75 %
Platelet Count: 25 K/uL — ABNORMAL LOW (ref 150–400)
RBC: 3.48 MIL/uL — ABNORMAL LOW (ref 3.87–5.11)
RDW: 19.9 % — ABNORMAL HIGH (ref 11.5–15.5)
WBC Count: 3.9 K/uL — ABNORMAL LOW (ref 4.0–10.5)
nRBC: 0 % (ref 0.0–0.2)

## 2024-08-28 LAB — SAMPLE TO BLOOD BANK

## 2024-08-29 ENCOUNTER — Other Ambulatory Visit (HOSPITAL_COMMUNITY): Payer: Self-pay

## 2024-08-29 ENCOUNTER — Other Ambulatory Visit (HOSPITAL_COMMUNITY): Payer: Self-pay | Admitting: Emergency Medicine

## 2024-08-29 DIAGNOSIS — I5032 Chronic diastolic (congestive) heart failure: Secondary | ICD-10-CM

## 2024-08-29 MED ORDER — SPIRONOLACTONE 25 MG PO TABS: 75.0000 mg | ORAL_TABLET | Freq: Every day | ORAL | 1 refills | Status: AC

## 2024-08-29 NOTE — Progress Notes (Signed)
 Paramedicine Encounter    Patient ID: Hannah Khan, female    DOB: 1955/11/22, 68 y.o.   MRN: [id number]   Complaints NONE  Assessment A&O x 4, skin W&D w/ good color.  Denies chest pain or SOB.  Mild edema to lower extremities non-pitting in nature.    Compliance with meds YES  Pill box filled n/a  Refills needed - handled by pt.  Meds changes since last visit NONE    Social changesNONE   BP 98/60 (BP Location: Left Arm, Patient Position: Sitting, Cuff Size: Normal)   Pulse 64   Resp 18   Wt 259 lb 9.6 oz (117.8 kg)   SpO2 98%   BMI 40.66 kg/m  Cbg 104 Weight yesterday- Last visit weight-261.2lb  ACTION: Home visit completed  Mary Claudene Kennel [phone] 08/29/2024  Patient Care Team: Regino Slater, MD as PCP - General (Family Medicine) Raford Riggs, MD as PCP - Cardiology (Cardiology) Amadeo Windell SAILOR, MD (Inactive) as Consulting Physician (Hematology and Oncology)  Patient Active Problem List   Diagnosis Date Noted   Chronic diastolic heart failure (HCC) 07/25/2024   Generalized abdominal pain 07/25/2024   Esophageal and gastric varices (HCC) 06/14/2024   Symptomatic anemia 06/10/2024   Cellulitis of left lower extremity 04/22/2024   Staphylococcus epidermidis bacteremia 04/21/2024   CHF (congestive heart failure) (HCC) 02/02/2024   Hypoxia 07/04/2023   Nausea & vomiting 07/04/2023   Nausea and vomiting 07/03/2023   Intractable nausea and vomiting 04/07/2023   Intractable vomiting with nausea 04/06/2023   Acute on chronic diastolic heart failure (HCC) 03/06/2023   Chronic hypotension 03/06/2023   Sepsis due to cellulitis (HCC) 03/05/2023   Severe sepsis (HCC) 03/05/2023   Portal hypertension (HCC) 12/23/2022   Acute encephalopathy 01/10/2021   Secondary hypercoagulable state 06/23/2020   Streptococcal bacteremia 03/16/2020   Chronic venous stasis dermatitis of both lower extremities 03/16/2020   Atrial fibrillation with RVR (HCC)  04/26/2018   Acute lower UTI 04/26/2018   Liver cirrhosis secondary to NASH (HCC) 04/26/2018   Hyperlipidemia 04/26/2018   Type 2 diabetes mellitus (HCC)    Cellulitis of right lower extremity 03/15/2018   Sepsis (HCC) 03/14/2018   Community acquired pneumonia of right lower lobe of lung 10/31/2016   Muscular abdominal pain in right upper quadrant 07/11/2016   Chronic heart failure with preserved ejection fraction (HFpEF) (HCC) 07/10/2016   Thrombocytopenia 07/10/2016   Permanent atrial fibrillation (HCC) 01/30/2016   Morbid obesity- 12/10/2015   Cellulitis, abdominal wall    Melena 01/31/2015   Aortic heart murmur 01/31/2015   Anemia 07/23/2013   Obstructive sleep apnea 08/07/2007   Essential hypertension 08/07/2007   Allergic rhinitis 08/07/2007   Current Medications[1] Allergies[2]   Social History   Socioeconomic History   Marital status: Married    Spouse name: Not on file   Number of children: Not on file   Years of education: Not on file   Highest education level: Not on file  Occupational History   Occupation: Scientist, Product/process Development  Tobacco Use   Smoking status: Former    Current packs/day: 0.00    Average packs/day: 1 pack/day for 3.0 years (3.0 ttl pk-yrs)    Types: Cigarettes    Start date: 09/20/1971    Quit date: 09/19/1974    Years since quitting: 49.9    Passive exposure: Past   Smokeless tobacco: Never  Vaping Use   Vaping status: Never Used  Substance and Sexual Activity   Alcohol  use: Not Currently  Comment: occ   Drug use: No   Sexual activity: Not on file  Other Topics Concern   Not on file  Social History Narrative   Lives in [place] with her husband.  Instructor for early childhood development.     Social Drivers of Health   Tobacco Use: Medium Risk (08/21/2024)   Received from [facility]   Patient History    Smoking Tobacco Use: Former    Smokeless Tobacco Use: Never    Passive Exposure: Not on file  Financial  Resource Strain: Not on file  Food Insecurity: No Food Insecurity (08/12/2024)   Epic    Worried About Programme Researcher, Broadcasting/film/video in the Last Year: Never true    Ran Out of Food in the Last Year: Never true  Transportation Needs: No Transportation Needs (07/24/2024)   Epic    Lack of Transportation (Medical): No    Lack of Transportation (Non-Medical): No  Physical Activity: Not on file  Stress: Not on file  Social Connections: Socially Integrated (07/24/2024)   Social Connection and Isolation Panel    Frequency of Communication with Friends and Family: More than three times a week    Frequency of Social Gatherings with Friends and Family: More than three times a week    Attends Religious Services: More than 4 times per year    Active Member of Clubs or Organizations: No    Attends Engineer, Structural: More than 4 times per year    Marital Status: Married  Catering Manager Violence: Not At Risk (07/24/2024)   Epic    Fear of Current or Ex-Partner: No    Emotionally Abused: No    Physically Abused: No    Sexually Abused: No  Depression (PHQ2-9): Low Risk (07/17/2024)   Depression (PHQ2-9)    PHQ-2 Score: 0  Alcohol  Screen: Not on file  Housing: Unknown (08/21/2024)   Received from [facility]   Epic    Unable to Pay for Housing in the Last Year: Not on file    Number of Times Moved in the Last Year: Not on file    At any time in the past 12 months, were you homeless or living in a shelter (including now)?: No  Utilities: Not At Risk (07/24/2024)   Epic    Threatened with loss of utilities: No  Health Literacy: Not on file    Physical Exam      Future Appointments  Date Time Provider Department Center  09/05/2024  2:30 PM [facility] [address]  09/11/2024 10:30 AM [facility] None  09/11/2024 11:00 AM Odean Potts, MD [facility] None          [1]  Current Outpatient Medications:    albuterol  (VENTOLIN  HFA) 108 (90  Base) MCG/ACT inhaler, Inhale 1-2 puffs into the lungs every 6 (six) hours as needed for wheezing or shortness of breath., Disp: , Rfl:    digoxin  (LANOXIN ) 0.125 MG tablet, TAKE 1 TABLET BY MOUTH EVERY DAY, Disp: 90 tablet, Rfl: 4   lactulose  (CHRONULAC ) 10 GM/15ML solution, Take 20 g by mouth 3 (three) times daily., Disp: , Rfl:    methocarbamol  (ROBAXIN ) 750 MG tablet, Take 1 tablet (750 mg total) by mouth every 8 (eight) hours as needed for muscle spasms., Disp: 15 tablet, Rfl: 0   metolazone  (ZAROXOLYN ) 2.5 MG tablet, Take 1 tablet (2.5 mg total) by mouth as needed., Disp: 3 tablet, Rfl: 1   metoprolol  tartrate (LOPRESSOR ) 25  MG tablet, Take 25 mg by mouth 2 (two) times daily., Disp: , Rfl:    midodrine  (PROAMATINE ) 10 MG tablet, Take 1.5 tablets (15 mg total) by mouth 3 (three) times daily. (Patient taking differently: Take 10 mg by mouth 3 (three) times daily.), Disp: 135 tablet, Rfl: 11   Multiple Vitamins-Minerals (PRESERVISION AREDS 2) CAPS, Take 1 capsule by mouth daily., Disp: , Rfl:    pantoprazole  (PROTONIX ) 40 MG tablet, Take 40 mg by mouth 2 (two) times daily., Disp: , Rfl:    potassium chloride  (MICRO-K ) 10 MEQ CR capsule, Take 4 capsules (40 mEq total) by mouth 2 (two) times daily. Take an extra 4 tablets when taking Metolazone ., Disp: , Rfl:    Propylene Glycol (SYSTANE BALANCE) 0.6 % SOLN, Place 1 drop into both eyes 2 (two) times daily., Disp: , Rfl:    spironolactone  (ALDACTONE ) 25 MG tablet, TAKE 3 TABLETS BY MOUTH DAILY, Disp: 270 tablet, Rfl: 1   torsemide  (DEMADEX ) 20 MG tablet, Take 4 tablets (80 mg total) by mouth in the morning and 2 tablets (40 mg total) in the evening, Disp: , Rfl:    TRESIBA  FLEXTOUCH 100 UNIT/ML FlexTouch Pen, Inject 36 Units into the skin daily. (TITRATE WHEN DIRECTED UP TO 50 UNITS), Disp: , Rfl:    XIFAXAN  550 MG TABS tablet, Take 550 mg by mouth 2 (two) times daily., Disp: , Rfl:  [2]  Allergies Allergen Reactions   Celebrex [Celecoxib] Other  (See Comments)    speech slurred with celebrex   Shellfish Allergy Swelling, Rash and Other (See Comments)    TINGLING AND SWELLING OF LIPS. LARGE WHELPS, QUARTER-SIZE   Tomato Anaphylaxis    Pt cannot swallow due to blisters that form inside throat resulting in swelling.    Barium-Containing Compounds Other (See Comments)    TACHYCARDIA   Fentanyl  Other (See Comments)    Hallucinations    Nsaids Other (See Comments)    Contraindication due to CKD stage 3    Prednisone Other (See Comments)    TACHYCARDIA   Sglt2 Inhibitors Other (See Comments)    Ozempic , Severe gastroparesis    Statins Other (See Comments)    AGGRAVATED FIBROMYALGIA   Tylenol  [Acetaminophen ] Other (See Comments)    UNSPECIFIED SPECIFIC REACTION >> DUE TO PLATELETS   Ibuprofen  Other (See Comments)    UNSPECIFIED SPECIFIC REACTION >> DUE TO PLATELETS   Tramadol  Other (See Comments)    Hallucination    Zetia [Ezetimibe] Other (See Comments)    Headache

## 2024-09-05 ENCOUNTER — Other Ambulatory Visit (HOSPITAL_BASED_OUTPATIENT_CLINIC_OR_DEPARTMENT_OTHER)

## 2024-09-06 ENCOUNTER — Other Ambulatory Visit (HOSPITAL_COMMUNITY): Payer: Self-pay | Admitting: Emergency Medicine

## 2024-09-06 NOTE — Progress Notes (Signed)
 Paramedicine Encounter    Patient ID: Hannah Khan, female    DOB: 07/08/1956, 68 y.o.   MRN: [id number]   Complaints NONE  Assessment A&O x 4, skin W&D w/ good color.  Denies chest pain or SOB.  Lung sounds clear bilat. No peripheral edema noted  Compliance with meds YES  Pill box filled n/a  pt. Takes meds from bottles  Refills needed NONE  Meds changes since last visit NONE    Social changes NONE   BP 130/60 (BP Location: Left Arm, Patient Position: Sitting, Cuff Size: Normal)   Pulse 83   Resp 18   Wt 258 lb 6.4 oz (117.2 kg)   SpO2 98%   BMI 40.47 kg/m  Weight yesterday Last visit weight- 259.6lb  ACTION: Home visit completed  Mary Claudene Kennel [phone] 09/06/2024  Patient Care Team: Regino Slater, MD as PCP - General (Family Medicine) Raford Riggs, MD as PCP - Cardiology (Cardiology) Amadeo Windell SAILOR, MD (Inactive) as Consulting Physician (Hematology and Oncology)  Patient Active Problem List   Diagnosis Date Noted   Chronic diastolic heart failure (HCC) 07/25/2024   Generalized abdominal pain 07/25/2024   Esophageal and gastric varices (HCC) 06/14/2024   Symptomatic anemia 06/10/2024   Cellulitis of left lower extremity 04/22/2024   Staphylococcus epidermidis bacteremia 04/21/2024   CHF (congestive heart failure) (HCC) 02/02/2024   Hypoxia 07/04/2023   Nausea & vomiting 07/04/2023   Nausea and vomiting 07/03/2023   Intractable nausea and vomiting 04/07/2023   Intractable vomiting with nausea 04/06/2023   Acute on chronic diastolic heart failure (HCC) 03/06/2023   Chronic hypotension 03/06/2023   Sepsis due to cellulitis (HCC) 03/05/2023   Severe sepsis (HCC) 03/05/2023   Portal hypertension (HCC) 12/23/2022   Acute encephalopathy 01/10/2021   Secondary hypercoagulable state 06/23/2020   Streptococcal bacteremia 03/16/2020   Chronic venous stasis dermatitis of both lower extremities 03/16/2020   Atrial fibrillation with RVR  (HCC) 04/26/2018   Acute lower UTI 04/26/2018   Liver cirrhosis secondary to NASH (HCC) 04/26/2018   Hyperlipidemia 04/26/2018   Type 2 diabetes mellitus (HCC)    Cellulitis of right lower extremity 03/15/2018   Sepsis (HCC) 03/14/2018   Community acquired pneumonia of right lower lobe of lung 10/31/2016   Muscular abdominal pain in right upper quadrant 07/11/2016   Chronic heart failure with preserved ejection fraction (HFpEF) (HCC) 07/10/2016   Thrombocytopenia 07/10/2016   Permanent atrial fibrillation (HCC) 01/30/2016   Morbid obesity- 12/10/2015   Cellulitis, abdominal wall    Melena 01/31/2015   Aortic heart murmur 01/31/2015   Anemia 07/23/2013   Obstructive sleep apnea 08/07/2007   Essential hypertension 08/07/2007   Allergic rhinitis 08/07/2007   Current Medications[1] Allergies[2]   Social History   Socioeconomic History   Marital status: Married    Spouse name: Not on file   Number of children: Not on file   Years of education: Not on file   Highest education level: Not on file  Occupational History   Occupation: Scientist, Product/process Development  Tobacco Use   Smoking status: Former    Current packs/day: 0.00    Average packs/day: 1 pack/day for 3.0 years (3.0 ttl pk-yrs)    Types: Cigarettes    Start date: 09/20/1971    Quit date: 09/19/1974    Years since quitting: 50.0    Passive exposure: Past   Smokeless tobacco: Never  Vaping Use   Vaping status: Never Used  Substance and Sexual Activity   Alcohol  use: Not Currently  Comment: occ   Drug use: No   Sexual activity: Not on file  Other Topics Concern   Not on file  Social History Narrative   Lives in [place] with her husband.  Instructor for early childhood development.     Social Drivers of Health   Tobacco Use: Medium Risk (08/21/2024)   Received from [facility]   Patient History    Smoking Tobacco Use: Former    Smokeless Tobacco Use: Never    Passive Exposure: Not on file  Financial  Resource Strain: Not on file  Food Insecurity: No Food Insecurity (08/12/2024)   Epic    Worried About Programme Researcher, Broadcasting/film/video in the Last Year: Never true    Ran Out of Food in the Last Year: Never true  Transportation Needs: No Transportation Needs (07/24/2024)   Epic    Lack of Transportation (Medical): No    Lack of Transportation (Non-Medical): No  Physical Activity: Not on file  Stress: Not on file  Social Connections: Socially Integrated (07/24/2024)   Social Connection and Isolation Panel    Frequency of Communication with Friends and Family: More than three times a week    Frequency of Social Gatherings with Friends and Family: More than three times a week    Attends Religious Services: More than 4 times per year    Active Member of [employer] or Organizations: No    Attends Engineer, Structural: More than 4 times per year    Marital Status: Married  Catering Manager Violence: Not At Risk (07/24/2024)   Epic    Fear of Current or Ex-Partner: No    Emotionally Abused: No    Physically Abused: No    Sexually Abused: No  Depression (PHQ2-9): Low Risk (07/17/2024)   Depression (PHQ2-9)    PHQ-2 Score: 0  Alcohol  Screen: Not on file  Housing: Unknown (08/21/2024)   Received from [facility]   Epic    Unable to Pay for Housing in the Last Year: Not on file    Number of Times Moved in the Last Year: Not on file    At any time in the past 12 months, were you homeless or living in a shelter (including now)?: No  Utilities: Not At Risk (07/24/2024)   Epic    Threatened with loss of utilities: No  Health Literacy: Not on file    Physical Exam      Future Appointments  Date Time Provider Department Center  09/11/2024 10:30 AM [facility] None  09/11/2024 11:00 AM Odean Potts, MD [facility] None  09/16/2024  8:00 AM [facility] [address]          [1]  Current Outpatient Medications:    albuterol  (VENTOLIN  HFA) 108 (90  Base) MCG/ACT inhaler, Inhale 1-2 puffs into the lungs every 6 (six) hours as needed for wheezing or shortness of breath., Disp: , Rfl:    digoxin  (LANOXIN ) 0.125 MG tablet, TAKE 1 TABLET BY MOUTH EVERY DAY, Disp: 90 tablet, Rfl: 4   lactulose  (CHRONULAC ) 10 GM/15ML solution, Take 20 g by mouth 3 (three) times daily., Disp: , Rfl:    methocarbamol  (ROBAXIN ) 750 MG tablet, Take 1 tablet (750 mg total) by mouth every 8 (eight) hours as needed for muscle spasms., Disp: 15 tablet, Rfl: 0   metolazone  (ZAROXOLYN ) 2.5 MG tablet, Take 1 tablet (2.5 mg total) by mouth as needed., Disp: 3 tablet, Rfl: 1   metoprolol  tartrate (LOPRESSOR ) 25  MG tablet, Take 25 mg by mouth 2 (two) times daily., Disp: , Rfl:    midodrine  (PROAMATINE ) 10 MG tablet, Take 1.5 tablets (15 mg total) by mouth 3 (three) times daily. (Patient taking differently: Take 15 mg by mouth 3 (three) times daily. Taking 10mg  3 times a day), Disp: 135 tablet, Rfl: 11   Multiple Vitamins-Minerals (PRESERVISION AREDS 2) CAPS, Take 1 capsule by mouth daily., Disp: , Rfl:    pantoprazole  (PROTONIX ) 40 MG tablet, Take 40 mg by mouth 2 (two) times daily., Disp: , Rfl:    potassium chloride  (MICRO-K ) 10 MEQ CR capsule, Take 4 capsules (40 mEq total) by mouth 2 (two) times daily. Take an extra 4 tablets when taking Metolazone ., Disp: , Rfl:    Propylene Glycol (SYSTANE BALANCE) 0.6 % SOLN, Place 1 drop into both eyes 2 (two) times daily., Disp: , Rfl:    spironolactone  (ALDACTONE ) 25 MG tablet, Take 3 tablets (75 mg total) by mouth daily., Disp: 270 tablet, Rfl: 1   torsemide  (DEMADEX ) 20 MG tablet, Take 4 tablets (80 mg total) by mouth in the morning and 2 tablets (40 mg total) in the evening, Disp: , Rfl:    TRESIBA  FLEXTOUCH 100 UNIT/ML FlexTouch Pen, Inject 36 Units into the skin daily. (TITRATE WHEN DIRECTED UP TO 50 UNITS), Disp: , Rfl:    XIFAXAN  550 MG TABS tablet, Take 550 mg by mouth 2 (two) times daily., Disp: , Rfl:  [2]  Allergies Allergen  Reactions   Celebrex [Celecoxib] Other (See Comments)    speech slurred with celebrex   Shellfish Allergy Swelling, Rash and Other (See Comments)    TINGLING AND SWELLING OF LIPS. LARGE WHELPS, QUARTER-SIZE   Tomato Anaphylaxis    Pt cannot swallow due to blisters that form inside throat resulting in swelling.    Barium-Containing Compounds Other (See Comments)    TACHYCARDIA   Fentanyl  Other (See Comments)    Hallucinations    Nsaids Other (See Comments)    Contraindication due to CKD stage 3    Prednisone Other (See Comments)    TACHYCARDIA   Sglt2 Inhibitors Other (See Comments)    Ozempic , Severe gastroparesis    Statins Other (See Comments)    AGGRAVATED FIBROMYALGIA   Tylenol  [Acetaminophen ] Other (See Comments)    UNSPECIFIED SPECIFIC REACTION >> DUE TO PLATELETS   Ibuprofen  Other (See Comments)    UNSPECIFIED SPECIFIC REACTION >> DUE TO PLATELETS   Tramadol  Other (See Comments)    Hallucination    Zetia [Ezetimibe] Other (See Comments)    Headache

## 2024-09-10 ENCOUNTER — Other Ambulatory Visit: Payer: Self-pay

## 2024-09-10 DIAGNOSIS — D649 Anemia, unspecified: Secondary | ICD-10-CM

## 2024-09-11 ENCOUNTER — Inpatient Hospital Stay

## 2024-09-11 ENCOUNTER — Inpatient Hospital Stay: Admitting: Hematology and Oncology

## 2024-09-11 VITALS — BP 90/50 | HR 85 | Temp 98.3°F | Resp 18 | Ht 67.0 in | Wt 261.6 lb

## 2024-09-11 DIAGNOSIS — D649 Anemia, unspecified: Secondary | ICD-10-CM | POA: Diagnosis not present

## 2024-09-11 LAB — CBC WITH DIFFERENTIAL (CANCER CENTER ONLY)
Abs Immature Granulocytes: 0.02 K/uL (ref 0.00–0.07)
Basophils Absolute: 0 K/uL (ref 0.0–0.1)
Basophils Relative: 1 %
Eosinophils Absolute: 0.2 K/uL (ref 0.0–0.5)
Eosinophils Relative: 4 %
HCT: 30.1 % — ABNORMAL LOW (ref 36.0–46.0)
Hemoglobin: 10.5 g/dL — ABNORMAL LOW (ref 12.0–15.0)
Immature Granulocytes: 0 %
Lymphocytes Relative: 13 %
Lymphs Abs: 0.6 K/uL — ABNORMAL LOW (ref 0.7–4.0)
MCH: 30.8 pg (ref 26.0–34.0)
MCHC: 34.9 g/dL (ref 30.0–36.0)
MCV: 88.3 fL (ref 80.0–100.0)
Monocytes Absolute: 0.4 K/uL (ref 0.1–1.0)
Monocytes Relative: 8 %
Neutro Abs: 3.4 K/uL (ref 1.7–7.7)
Neutrophils Relative %: 74 %
Platelet Count: 38 K/uL — ABNORMAL LOW (ref 150–400)
RBC: 3.41 MIL/uL — ABNORMAL LOW (ref 3.87–5.11)
RDW: 19.5 % — ABNORMAL HIGH (ref 11.5–15.5)
WBC Count: 4.6 K/uL (ref 4.0–10.5)
nRBC: 0 % (ref 0.0–0.2)

## 2024-09-11 LAB — SAMPLE TO BLOOD BANK

## 2024-09-11 NOTE — Progress Notes (Signed)
 "  Patient Care Team: Regino Slater, MD as PCP - General (Family Medicine) Raford Riggs, MD as PCP - Cardiology (Cardiology) Amadeo Windell SAILOR, MD (Inactive) as Consulting Physician (Hematology and Oncology)  DIAGNOSIS:  Encounter Diagnosis  Name Primary?   Anemia, unspecified type Yes    CHIEF COMPLIANT: Follow-up of anemia  HISTORY OF PRESENT ILLNESS:  History of Present Illness Hannah Khan is a 68 year old female with chronic anemia, thrombocytopenia, and leukopenia secondary to cirrhosis with hypersplenism who presents for hematology follow-up and blood count monitoring.  She has chronic cytopenias on regular laboratory surveillance. Hemoglobin is stable and improved at 10.5 g/dL. Platelets have increased to 38,000/L from prior values in the twenties to low thirties. White blood cell count is normal at 4.6 x 10^9/L. She takes midodrine  for blood pressure support.  She has had no recent bleeding or transfusions. She notes increased energy and stamina and can now tolerate more activity such as shopping. She denies abnormal bruising, new masses, or lymphadenopathy.  She is undergoing workup with the transplant team for possible liver and kidney transplantation and has upcoming appointments at [facility]. She feels apprehensive but is moving forward with evaluation.       ALLERGIES:  is allergic to celebrex [celecoxib], shellfish allergy, tomato, barium-containing compounds, fentanyl , nsaids, prednisone, sglt2 inhibitors, statins, tylenol  [acetaminophen ], ibuprofen , tramadol , and zetia [ezetimibe].  MEDICATIONS:  Current Outpatient Medications  Medication Sig Dispense Refill   albuterol  (VENTOLIN  HFA) 108 (90 Base) MCG/ACT inhaler Inhale 1-2 puffs into the lungs every 6 (six) hours as needed for wheezing or shortness of breath.     digoxin  (LANOXIN ) 0.125 MG tablet TAKE 1 TABLET BY MOUTH EVERY DAY 90 tablet 4   lactulose  (CHRONULAC ) 10 GM/15ML solution Take 20 g by mouth 3  (three) times daily.     methocarbamol  (ROBAXIN ) 750 MG tablet Take 1 tablet (750 mg total) by mouth every 8 (eight) hours as needed for muscle spasms. 15 tablet 0   metolazone  (ZAROXOLYN ) 2.5 MG tablet Take 1 tablet (2.5 mg total) by mouth as needed. 3 tablet 1   metoprolol  tartrate (LOPRESSOR ) 25 MG tablet Take 25 mg by mouth 2 (two) times daily.     midodrine  (PROAMATINE ) 10 MG tablet Take 1.5 tablets (15 mg total) by mouth 3 (three) times daily. 135 tablet 11   Multiple Vitamins-Minerals (PRESERVISION AREDS 2) CAPS Take 1 capsule by mouth daily.     pantoprazole  (PROTONIX ) 40 MG tablet Take 40 mg by mouth 2 (two) times daily.     potassium chloride  (MICRO-K ) 10 MEQ CR capsule Take 4 capsules (40 mEq total) by mouth 2 (two) times daily. Take an extra 4 tablets when taking Metolazone .     Propylene Glycol (SYSTANE BALANCE) 0.6 % SOLN Place 1 drop into both eyes 2 (two) times daily.     spironolactone  (ALDACTONE ) 25 MG tablet Take 3 tablets (75 mg total) by mouth daily. 270 tablet 1   torsemide  (DEMADEX ) 20 MG tablet Take 4 tablets (80 mg total) by mouth in the morning and 2 tablets (40 mg total) in the evening     TRESIBA  FLEXTOUCH 100 UNIT/ML FlexTouch Pen Inject 36 Units into the skin daily. (TITRATE WHEN DIRECTED UP TO 50 UNITS)     XIFAXAN  550 MG TABS tablet Take 550 mg by mouth 2 (two) times daily.     No current facility-administered medications for this visit.    PHYSICAL EXAMINATION: ECOG PERFORMANCE STATUS: 1 - Symptomatic but completely ambulatory  Vitals:  09/11/24 1122  BP: (!) 90/50  Pulse: 85  Resp: 18  Temp: 98.3 F (36.8 C)  SpO2: 99%   Filed Weights   09/11/24 1122  Weight: 261 lb 9.6 oz (118.7 kg)      LABORATORY DATA:  I have reviewed the data as listed    Latest Ref Rng & Units 07/29/2024   10:44 AM 07/28/2024    3:58 AM 07/27/2024    9:45 AM  CMP  Glucose 70 - 99 mg/dL 854  96  881   BUN 8 - 23 mg/dL 30  29  26    Creatinine 0.44 - 1.00 mg/dL 8.14   7.97  8.39   Sodium 135 - 145 mmol/L 138  135  138   Potassium 3.5 - 5.1 mmol/L 3.5  3.9  3.1   Chloride 98 - 111 mmol/L 101  99  101   CO2 22 - 32 mmol/L 27  24  23    Calcium  8.9 - 10.3 mg/dL 8.8  8.7  8.6   Total Protein 6.5 - 8.1 g/dL  6.2  6.8   Total Bilirubin 0.0 - 1.2 mg/dL  3.3  4.4   Alkaline Phos 38 - 126 U/L  61  61   AST 15 - 41 U/L  25  24   ALT 0 - 44 U/L  15  15     Lab Results  Component Value Date   WBC 4.6 09/11/2024   HGB 10.5 (L) 09/11/2024   HCT 30.1 (L) 09/11/2024   MCV 88.3 09/11/2024   PLT 38 (L) 09/11/2024   NEUTROABS 3.4 09/11/2024    ASSESSMENT & PLAN:  Anemia Lab review: 04/24/2024: Hemoglobin 7.4, platelets 31 06/10/2024: Hemoglobin 5.6, platelets 28, WBC 3.1 06/13/2024: Hemoglobin 8.3, MCV 90.9, platelets 31, WBC 4.1 07/17/2024: Hemoglobin 6.8, platelets 25 (2 units of PRBC being planned for 07/20/2024) 08/28/2024: Hemoglobin 10.4, platelets 25 09/11/2024: Hemoglobin 10.5   Longstanding anemia and thrombocytopenia secondary to cirrhosis of the liver with hypersplenism.  Because of anemia suspected to be upper GI bleed from varices.   Chronic thrombocytopenia: Will continue to watch and monitor.  Platelet counts are pretty steady between 25 and 30.  Today's platelets are 38 She is seeing [facility] kidney and liver transplant team.   She will come back monthly for lab checks and I will see her back in 6 months Assessment & Plan Chronic anemia Hemoglobin >10 g/dL, asymptomatic, no recent bleeding or transfusion, no malignancy. - Reduced blood count monitoring to monthly for six months. - Advised to report increased fatigue or symptoms of anemia. - Scheduled follow-up in six months to reassess monitoring frequency.  Chronic thrombocytopenia Platelet count improved to 38,000/L, no active bleeding, no malignancy. - Continued monthly platelet count monitoring for six months.  Chronic leukopenia White blood cell count normal, no infections,  no malignancy. - Continued monthly white blood cell count monitoring for six months.      No orders of the defined types were placed in this encounter.  The patient has a good understanding of the overall plan. she agrees with it. she will call with any problems that may develop before the next visit here.  I personally spent a total of 30 minutes in the care of the patient today including preparing to see the patient, getting/reviewing separately obtained history, performing a medically appropriate exam/evaluation, counseling and educating, placing orders, referring and communicating with other health care professionals, documenting clinical information in the EHR, independently interpreting results, communicating results,  and coordinating care.   Viinay K Dawsen Krieger, MD 09/11/2024    "

## 2024-09-11 NOTE — Assessment & Plan Note (Signed)
 Lab review: 04/24/2024: Hemoglobin 7.4, platelets 31 06/10/2024: Hemoglobin 5.6, platelets 28, WBC 3.1 06/13/2024: Hemoglobin 8.3, MCV 90.9, platelets 31, WBC 4.1 07/17/2024: Hemoglobin 6.8, platelets 25 (2 units of PRBC being planned for 07/20/2024) 08/28/2024: Hemoglobin 10.4, platelets 25   Longstanding anemia and thrombocytopenia secondary to cirrhosis of the liver with hypersplenism.  Because of anemia suspected to be upper GI bleed from varices.    Treatment plan: Since she is not requiring blood transfusions as often, we can check her labs every 2 months and follow-up    Chronic thrombocytopenia: Will continue to watch and monitor.  Platelet counts are pretty steady between 25 and 30   Patient will come every 2 months for lab check and transfusion as needed. I will see the patient back in 6 months.

## 2024-09-16 ENCOUNTER — Ambulatory Visit (HOSPITAL_BASED_OUTPATIENT_CLINIC_OR_DEPARTMENT_OTHER)
Admission: RE | Admit: 2024-09-16 | Discharge: 2024-09-16 | Disposition: A | Discharge status: Home / Self Care | Source: Ambulatory Visit | Attending: Family Medicine | Admitting: Family Medicine

## 2024-09-16 DIAGNOSIS — E2839 Other primary ovarian failure: Secondary | ICD-10-CM | POA: Diagnosis present

## 2024-09-24 ENCOUNTER — Other Ambulatory Visit (HOSPITAL_COMMUNITY): Payer: Self-pay | Admitting: Emergency Medicine

## 2024-09-24 NOTE — Progress Notes (Unsigned)
 Paramedicine Encounter    Patient ID: Hannah Khan, female    DOB: 1956-06-20, 69 y.o.   MRN: [id number]   Complaints***  Assessment***  Compliance with meds***  Pill box filled***  Refills needed***  Meds changes since last visit***    Social changes***   There were no vitals taken for this visit. Weight yesterday-*** Last visit weight-***  ACTION: {Paramed Action:[phone]}  Mary Sharps, EMT-Paramedic [phone] 09/24/2024  Patient Care Team: Regino Slater, MD as PCP - General (Family Medicine) Raford Riggs, MD as PCP - Cardiology (Cardiology) Amadeo Windell SAILOR, MD (Inactive) as Consulting Physician (Hematology and Oncology)  Patient Active Problem List   Diagnosis Date Noted   Chronic diastolic heart failure (HCC) 07/25/2024   Generalized abdominal pain 07/25/2024   Esophageal and gastric varices (HCC) 06/14/2024   Symptomatic anemia 06/10/2024   Cellulitis of left lower extremity 04/22/2024   Staphylococcus epidermidis bacteremia 04/21/2024   CHF (congestive heart failure) (HCC) 02/02/2024   Hypoxia 07/04/2023   Nausea & vomiting 07/04/2023   Nausea and vomiting 07/03/2023   Intractable nausea and vomiting 04/07/2023   Intractable vomiting with nausea 04/06/2023   Acute on chronic diastolic heart failure (HCC) 03/06/2023   Chronic hypotension 03/06/2023   Sepsis due to cellulitis (HCC) 03/05/2023   Severe sepsis (HCC) 03/05/2023   Portal hypertension (HCC) 12/23/2022   Acute encephalopathy 01/10/2021   Secondary hypercoagulable state 06/23/2020   Streptococcal bacteremia 03/16/2020   Chronic venous stasis dermatitis of both lower extremities 03/16/2020   Atrial fibrillation with RVR (HCC) 04/26/2018   Acute lower UTI 04/26/2018   Liver cirrhosis secondary to NASH (HCC) 04/26/2018   Hyperlipidemia 04/26/2018   Type 2 diabetes mellitus (HCC)    Cellulitis of right lower extremity 03/15/2018   Sepsis (HCC) 03/14/2018    Community acquired pneumonia of right lower lobe of lung 10/31/2016   Muscular abdominal pain in right upper quadrant 07/11/2016   Chronic heart failure with preserved ejection fraction (HFpEF) (HCC) 07/10/2016   Thrombocytopenia 07/10/2016   Permanent atrial fibrillation (HCC) 01/30/2016   Morbid obesity- 12/10/2015   Cellulitis, abdominal wall    Melena 01/31/2015   Aortic heart murmur 01/31/2015   Anemia 07/23/2013   Obstructive sleep apnea 08/07/2007   Essential hypertension 08/07/2007   Allergic rhinitis 08/07/2007   Current Medications[1] Allergies[2]   Social History   Socioeconomic History   Marital status: Married    Spouse name: Not on file   Number of children: Not on file   Years of education: Not on file   Highest education level: Not on file  Occupational History   Occupation: Scientist, Product/process Development  Tobacco Use   Smoking status: Former    Current packs/day: 0.00    Average packs/day: 1 pack/day for 3.0 years (3.0 ttl pk-yrs)    Types: Cigarettes    Start date: 09/20/1971    Quit date: 09/19/1974    Years since quitting: 50.0    Passive exposure: Past   Smokeless tobacco: Never  Vaping Use   Vaping status: Never Used  Substance and Sexual Activity   Alcohol  use: Not Currently    Comment: occ   Drug use: No   Sexual activity: Not on file  Other Topics Concern   Not on file  Social History Narrative   Lives in [place] with her husband.  Instructor for early childhood development.     Social Drivers of Health   Tobacco Use: Medium Risk (08/21/2024)   Received from [facility]  Patient History    Smoking Tobacco Use: Former    Smokeless Tobacco Use: Never    Passive Exposure: Not on Actuary Strain: Not on file  Food Insecurity: No Food Insecurity (08/12/2024)   Epic    Worried About Programme Researcher, Broadcasting/film/video in the Last Year: Never true    Ran Out of Food in the Last Year: Never true   Transportation Needs: No Transportation Needs (07/24/2024)   Epic    Lack of Transportation (Medical): No    Lack of Transportation (Non-Medical): No  Physical Activity: Not on file  Stress: Not on file  Social Connections: Socially Integrated (07/24/2024)   Social Connection and Isolation Panel    Frequency of Communication with Friends and Family: More than three times a week    Frequency of Social Gatherings with Friends and Family: More than three times a week    Attends Religious Services: More than 4 times per year    Active Member of Clubs or Organizations: No    Attends Engineer, Structural: More than 4 times per year    Marital Status: Married  Catering Manager Violence: Not At Risk (07/24/2024)   Epic    Fear of Current or Ex-Partner: No    Emotionally Abused: No    Physically Abused: No    Sexually Abused: No  Depression (PHQ2-9): Low Risk (07/17/2024)   Depression (PHQ2-9)    PHQ-2 Score: 0  Alcohol  Screen: Not on file  Housing: Unknown (08/21/2024)   Received from [facility]   Epic    Unable to Pay for Housing in the Last Year: Not on file    Number of Times Moved in the Last Year: Not on file    At any time in the past 12 months, were you homeless or living in a shelter (including now)?: No  Utilities: Not At Risk (07/24/2024)   Epic    Threatened with loss of utilities: No  Health Literacy: Not on file    Physical Exam      Future Appointments  Date Time Provider Department Center  10/11/2024 10:30 AM [facility] None  10/28/2024 10:40 AM Rolan Ezra RAMAN, MD [facility] None  11/12/2024 10:30 AM [facility] None  12/10/2024 10:30 AM [facility] None  01/10/2025 10:30 AM [facility] None  02/07/2025 10:30 AM [facility] None  03/12/2025 10:45 AM [facility] None  03/12/2025 11:15 AM Odean Potts, MD [facility] None             [1]  Current Outpatient Medications:    albuterol  (VENTOLIN  HFA) 108 (90 Base) MCG/ACT inhaler, Inhale 1-2 puffs into the lungs every 6 (six) hours as needed for wheezing or shortness of breath., Disp: , Rfl:    digoxin  (LANOXIN ) 0.125 MG tablet, TAKE 1 TABLET BY MOUTH EVERY DAY, Disp: 90 tablet, Rfl: 4   lactulose  (CHRONULAC ) 10 GM/15ML solution, Take 20 g by mouth 3 (three) times daily., Disp: , Rfl:    methocarbamol  (ROBAXIN ) 750 MG tablet, Take 1 tablet (750 mg total) by mouth every 8 (eight) hours as needed for muscle spasms., Disp: 15 tablet, Rfl: 0   metolazone  (ZAROXOLYN ) 2.5 MG tablet, Take 1 tablet (2.5 mg total) by mouth as needed., Disp: 3 tablet, Rfl: 1   metoprolol  tartrate (LOPRESSOR ) 25 MG tablet, Take 25 mg by mouth 2 (two) times daily., Disp: , Rfl:    midodrine  (PROAMATINE ) 10 MG tablet, Take 1.5  tablets (15 mg total) by mouth 3 (three) times daily., Disp: 135 tablet, Rfl: 11   Multiple Vitamins-Minerals (PRESERVISION AREDS 2) CAPS, Take 1 capsule by mouth daily., Disp: , Rfl:    pantoprazole  (PROTONIX ) 40 MG tablet, Take 40 mg by mouth 2 (two) times daily., Disp: , Rfl:    potassium chloride  (MICRO-K ) 10 MEQ CR capsule, Take 4 capsules (40 mEq total) by mouth 2 (two) times daily. Take an extra 4 tablets when taking Metolazone ., Disp: , Rfl:    Propylene Glycol (SYSTANE BALANCE) 0.6 % SOLN, Place 1 drop into both eyes 2 (two) times daily., Disp: , Rfl:    spironolactone  (ALDACTONE ) 25 MG tablet, Take 3 tablets (75 mg total) by mouth daily., Disp: 270 tablet, Rfl: 1   torsemide  (DEMADEX ) 20 MG tablet, Take 4 tablets (80 mg total) by mouth in the morning and 2 tablets (40 mg total) in the evening, Disp: , Rfl:    TRESIBA  FLEXTOUCH 100 UNIT/ML FlexTouch Pen, Inject 36 Units into the skin daily. (TITRATE WHEN DIRECTED UP TO 50 UNITS), Disp: , Rfl:    XIFAXAN  550 MG TABS tablet, Take 550 mg by mouth 2 (two) times daily., Disp: , Rfl:   [2] Allergies Allergen Reactions   Celebrex [Celecoxib] Other (See Comments)    speech slurred with celebrex   Shellfish Allergy Swelling, Rash and Other (See Comments)    TINGLING AND SWELLING OF LIPS. LARGE WHELPS, QUARTER-SIZE   Tomato Anaphylaxis    Pt cannot swallow due to blisters that form inside throat resulting in swelling.    Barium-Containing Compounds Other (See Comments)    TACHYCARDIA   Fentanyl  Other (See Comments)    Hallucinations    Nsaids Other (See Comments)    Contraindication due to CKD stage 3    Prednisone Other (See Comments)    TACHYCARDIA   Sglt2 Inhibitors Other (See Comments)    Ozempic , Severe gastroparesis    Statins Other (See Comments)    AGGRAVATED FIBROMYALGIA   Tylenol  [Acetaminophen ] Other (See Comments)    UNSPECIFIED SPECIFIC REACTION >> DUE TO PLATELETS   Ibuprofen  Other (See Comments)    UNSPECIFIED SPECIFIC REACTION >> DUE TO PLATELETS   Tramadol  Other (See Comments)    Hallucination    Zetia [Ezetimibe] Other (See Comments)    Headache

## 2024-09-30 ENCOUNTER — Other Ambulatory Visit

## 2024-10-10 ENCOUNTER — Other Ambulatory Visit: Payer: Self-pay

## 2024-10-10 DIAGNOSIS — D649 Anemia, unspecified: Secondary | ICD-10-CM

## 2024-10-11 ENCOUNTER — Telehealth: Payer: Self-pay

## 2024-10-11 ENCOUNTER — Inpatient Hospital Stay: Attending: Hematology and Oncology

## 2024-10-11 DIAGNOSIS — D649 Anemia, unspecified: Secondary | ICD-10-CM

## 2024-10-11 LAB — CBC WITH DIFFERENTIAL (CANCER CENTER ONLY)
Abs Immature Granulocytes: 0.03 K/uL (ref 0.00–0.07)
Basophils Absolute: 0 K/uL (ref 0.0–0.1)
Basophils Relative: 1 %
Eosinophils Absolute: 0.1 K/uL (ref 0.0–0.5)
Eosinophils Relative: 2 %
HCT: 27.1 % — ABNORMAL LOW (ref 36.0–46.0)
Hemoglobin: 9.2 g/dL — ABNORMAL LOW (ref 12.0–15.0)
Immature Granulocytes: 1 %
Lymphocytes Relative: 12 %
Lymphs Abs: 0.6 K/uL — ABNORMAL LOW (ref 0.7–4.0)
MCH: 31.5 pg (ref 26.0–34.0)
MCHC: 33.9 g/dL (ref 30.0–36.0)
MCV: 92.8 fL (ref 80.0–100.0)
Monocytes Absolute: 0.4 K/uL (ref 0.1–1.0)
Monocytes Relative: 8 %
Neutro Abs: 4 K/uL (ref 1.7–7.7)
Neutrophils Relative %: 76 %
Platelet Count: 37 K/uL — ABNORMAL LOW (ref 150–400)
RBC: 2.92 MIL/uL — ABNORMAL LOW (ref 3.87–5.11)
RDW: 16.2 % — ABNORMAL HIGH (ref 11.5–15.5)
WBC Count: 5.2 K/uL (ref 4.0–10.5)
nRBC: 0 % (ref 0.0–0.2)

## 2024-10-11 NOTE — Telephone Encounter (Signed)
 RN called pt to report her results of cbc of Hgb 9.2 and plts 37 on 1/23. RN assessed how pt was feeling and she reports that she is feeling well. Per MD RN advised that transfusion was not needed based on both lab results and pts reported condition. Pt verbalized understanding.

## 2024-10-18 ENCOUNTER — Other Ambulatory Visit (HOSPITAL_COMMUNITY): Payer: Self-pay | Admitting: Emergency Medicine

## 2024-10-18 NOTE — Progress Notes (Unsigned)
 Paramedicine Encounter    Patient ID: Hannah Khan, female    DOB: [birth year], 69 y.o.   MRN: [id number]   Complaints***  Assessment***  Compliance with meds***  Pill box filled***  Refills needed***  Meds changes since last visit***    Social changes***   There were no vitals taken for this visit. Weight yesterday-*** Last visit weight-***  ACTION: {Paramed Action:[phone]}  Mary Sharps, EMT-Paramedic [phone] 10/18/24  Patient Care Team: Regino Slater, MD as PCP - General (Family Medicine) Raford Riggs, MD as PCP - Cardiology (Cardiology) Amadeo Windell SAILOR, MD (Inactive) as Consulting Physician (Hematology and Oncology)  Patient Active Problem List   Diagnosis Date Noted   Chronic diastolic heart failure (HCC) 07/25/2024   Generalized abdominal pain 07/25/2024   Esophageal and gastric varices (HCC) 06/14/2024   Symptomatic anemia 06/10/2024   Cellulitis of left lower extremity 04/22/2024   Staphylococcus epidermidis bacteremia 04/21/2024   CHF (congestive heart failure) (HCC) 02/02/2024   Hypoxia 07/04/2023   Nausea & vomiting 07/04/2023   Nausea and vomiting 07/03/2023   Intractable nausea and vomiting 04/07/2023   Intractable vomiting with nausea 04/06/2023   Acute on chronic diastolic heart failure (HCC) 03/06/2023   Chronic hypotension 03/06/2023   Sepsis due to cellulitis (HCC) 03/05/2023   Severe sepsis (HCC) 03/05/2023   Portal hypertension (HCC) 12/23/2022   Acute encephalopathy 01/10/2021   Secondary hypercoagulable state 06/23/2020   Streptococcal bacteremia 03/16/2020   Chronic venous stasis dermatitis of both lower extremities 03/16/2020   Atrial fibrillation with RVR (HCC) 04/26/2018   Acute lower UTI 04/26/2018   Liver cirrhosis secondary to NASH (HCC) 04/26/2018   Hyperlipidemia 04/26/2018   Type 2 diabetes mellitus (HCC)    Cellulitis of right lower extremity 03/15/2018   Sepsis (HCC) 03/14/2018    Community acquired pneumonia of right lower lobe of lung 10/31/2016   Muscular abdominal pain in right upper quadrant 07/11/2016   Chronic heart failure with preserved ejection fraction (HFpEF) (HCC) 07/10/2016   Thrombocytopenia 07/10/2016   Permanent atrial fibrillation (HCC) 01/30/2016   Morbid obesity- 12/10/2015   Cellulitis, abdominal wall    Melena 01/31/2015   Aortic heart murmur 01/31/2015   Anemia 07/23/2013   Obstructive sleep apnea 08/07/2007   Essential hypertension 08/07/2007   Allergic rhinitis 08/07/2007   Current Medications[1] Allergies[2]   Social History   Socioeconomic History   Marital status: Married    Spouse name: Not on file   Number of children: Not on file   Years of education: Not on file   Highest education level: Not on file  Occupational History   Occupation: Scientist, Product/process Development  Tobacco Use   Smoking status: Former    Current packs/day: 0.00    Average packs/day: 1 pack/day for 3.0 years (3.0 ttl pk-yrs)    Types: Cigarettes    Start date: 09/20/1971    Quit date: 09/19/1974    Years since quitting: 50.1    Passive exposure: Past   Smokeless tobacco: Never  Vaping Use   Vaping status: Never Used  Substance and Sexual Activity   Alcohol  use: Not Currently    Comment: occ   Drug use: No   Sexual activity: Not on file  Other Topics Concern   Not on file  Social History Narrative   Lives in [place] with her husband.  Instructor for early childhood development.     Social Drivers of Health   Tobacco Use: Medium Risk (08/21/2024)   Received from [facility]  Patient History    Smoking Tobacco Use: Former    Smokeless Tobacco Use: Never    Passive Exposure: Not on Actuary Strain: Not on file  Food Insecurity: No Food Insecurity (08/12/2024)   Epic    Worried About Programme Researcher, Broadcasting/film/video in the Last Year: Never true    Ran Out of Food in the Last Year: Never true   Transportation Needs: No Transportation Needs (07/24/2024)   Epic    Lack of Transportation (Medical): No    Lack of Transportation (Non-Medical): No  Physical Activity: Not on file  Stress: Not on file  Social Connections: Socially Integrated (07/24/2024)   Social Connection and Isolation Panel    Frequency of Communication with Friends and Family: More than three times a week    Frequency of Social Gatherings with Friends and Family: More than three times a week    Attends Religious Services: More than 4 times per year    Active Member of Clubs or Organizations: No    Attends Engineer, Structural: More than 4 times per year    Marital Status: Married  Catering Manager Violence: Not At Risk (07/24/2024)   Epic    Fear of Current or Ex-Partner: No    Emotionally Abused: No    Physically Abused: No    Sexually Abused: No  Depression (PHQ2-9): Low Risk (07/17/2024)   Depression (PHQ2-9)    PHQ-2 Score: 0  Alcohol  Screen: Not on file  Housing: Unknown (08/21/2024)   Received from [facility]   Epic    Unable to Pay for Housing in the Last Year: Not on file    Number of Times Moved in the Last Year: Not on file    At any time in the past 12 months, were you homeless or living in a shelter (including now)?: No  Utilities: Not At Risk (07/24/2024)   Epic    Threatened with loss of utilities: No  Health Literacy: Not on file    Physical Exam      Future Appointments  Date Time Provider Department Center  10/28/2024 10:40 AM Rolan Ezra RAMAN, MD [facility] None  11/12/2024 10:30 AM [facility] None  12/10/2024 10:30 AM [facility] None  01/10/2025 10:30 AM [facility] None  02/07/2025 10:30 AM [facility] None  03/12/2025 10:45 AM [facility] None  03/12/2025 11:15 AM Odean Potts, MD [facility] None            [1]  Current Outpatient Medications:     albuterol  (VENTOLIN  HFA) 108 (90 Base) MCG/ACT inhaler, Inhale 1-2 puffs into the lungs every 6 (six) hours as needed for wheezing or shortness of breath., Disp: , Rfl:    digoxin  (LANOXIN ) 0.125 MG tablet, TAKE 1 TABLET BY MOUTH EVERY DAY, Disp: 90 tablet, Rfl: 4   lactulose  (CHRONULAC ) 10 GM/15ML solution, Take 20 g by mouth 3 (three) times daily., Disp: , Rfl:    methocarbamol  (ROBAXIN ) 750 MG tablet, Take 1 tablet (750 mg total) by mouth every 8 (eight) hours as needed for muscle spasms., Disp: 15 tablet, Rfl: 0   metolazone  (ZAROXOLYN ) 2.5 MG tablet, Take 1 tablet (2.5 mg total) by mouth as needed., Disp: 3 tablet, Rfl: 1   metoprolol  tartrate (LOPRESSOR ) 25 MG tablet, Take 25 mg by mouth 2 (two) times daily., Disp: , Rfl:    midodrine  (PROAMATINE ) 10 MG tablet, Take 1.5 tablets (15 mg total) by mouth 3 (three)  times daily., Disp: 135 tablet, Rfl: 11   Multiple Vitamins-Minerals (PRESERVISION AREDS 2) CAPS, Take 1 capsule by mouth daily., Disp: , Rfl:    pantoprazole  (PROTONIX ) 40 MG tablet, Take 40 mg by mouth 2 (two) times daily., Disp: , Rfl:    potassium chloride  (MICRO-K ) 10 MEQ CR capsule, Take 4 capsules (40 mEq total) by mouth 2 (two) times daily. Take an extra 4 tablets when taking Metolazone ., Disp: , Rfl:    Propylene Glycol (SYSTANE BALANCE) 0.6 % SOLN, Place 1 drop into both eyes 2 (two) times daily., Disp: , Rfl:    spironolactone  (ALDACTONE ) 25 MG tablet, Take 3 tablets (75 mg total) by mouth daily., Disp: 270 tablet, Rfl: 1   torsemide  (DEMADEX ) 20 MG tablet, Take 4 tablets (80 mg total) by mouth in the morning and 2 tablets (40 mg total) in the evening, Disp: , Rfl:    TRESIBA  FLEXTOUCH 100 UNIT/ML FlexTouch Pen, Inject 36 Units into the skin daily. (TITRATE WHEN DIRECTED UP TO 50 UNITS), Disp: , Rfl:    XIFAXAN  550 MG TABS tablet, Take 550 mg by mouth 2 (two) times daily., Disp: , Rfl:  [2] Allergies Allergen Reactions   Celebrex [Celecoxib] Other (See  Comments)    speech slurred with celebrex   Shellfish Allergy Swelling, Rash and Other (See Comments)    TINGLING AND SWELLING OF LIPS. LARGE WHELPS, QUARTER-SIZE   Tomato Anaphylaxis    Pt cannot swallow due to blisters that form inside throat resulting in swelling.    Barium-Containing Compounds Other (See Comments)    TACHYCARDIA   Fentanyl  Other (See Comments)    Hallucinations    Nsaids Other (See Comments)    Contraindication due to CKD stage 3    Prednisone Other (See Comments)    TACHYCARDIA   Sglt2 Inhibitors Other (See Comments)    Ozempic , Severe gastroparesis    Statins Other (See Comments)    AGGRAVATED FIBROMYALGIA   Tylenol  [Acetaminophen ] Other (See Comments)    UNSPECIFIED SPECIFIC REACTION >> DUE TO PLATELETS   Ibuprofen  Other (See Comments)    UNSPECIFIED SPECIFIC REACTION >> DUE TO PLATELETS   Tramadol  Other (See Comments)    Hallucination    Zetia [Ezetimibe] Other (See Comments)    Headache

## 2024-10-25 ENCOUNTER — Telehealth (HOSPITAL_COMMUNITY): Payer: Self-pay | Admitting: Cardiology

## 2024-10-25 NOTE — Telephone Encounter (Signed)
 Called to confirm/remind patient of [facility] appointment [facility] on 10/25/2024.   Appointment:   [x] Confirmed  [] Left mess   [] No answer/No voice mail  [] VM Full/unable to leave message  [] Phone not in service  Patient reminded to bring all medications and/or complete list.  Confirmed patient has transportation. Gave directions, instructed to utilize valet parking.

## 2024-10-28 ENCOUNTER — Ambulatory Visit (HOSPITAL_COMMUNITY): Admitting: Cardiology

## 2024-11-12 ENCOUNTER — Inpatient Hospital Stay: Attending: Hematology and Oncology

## 2024-12-10 ENCOUNTER — Inpatient Hospital Stay: Attending: Hematology and Oncology

## 2025-01-10 ENCOUNTER — Inpatient Hospital Stay: Attending: Hematology and Oncology

## 2025-02-07 ENCOUNTER — Inpatient Hospital Stay: Attending: Hematology and Oncology

## 2025-03-12 ENCOUNTER — Inpatient Hospital Stay: Attending: Hematology and Oncology | Admitting: Hematology and Oncology

## 2025-03-12 ENCOUNTER — Inpatient Hospital Stay
